# Patient Record
Sex: Male | Born: 1935 | Race: White | Hispanic: No | Smoking: Former smoker
Health system: Southern US, Community
[De-identification: ages and names within clinical notes are randomized; demographics above are authoritative.]

## PROBLEM LIST (undated history)

## (undated) ENCOUNTER — Emergency Department (HOSPITAL_COMMUNITY): Discharge status: Against Medical Advice | Payer: Medicare Other

## (undated) DIAGNOSIS — R42 Dizziness and giddiness: Secondary | ICD-10-CM

## (undated) DIAGNOSIS — E785 Hyperlipidemia, unspecified: Secondary | ICD-10-CM

## (undated) DIAGNOSIS — R55 Syncope and collapse: Secondary | ICD-10-CM

## (undated) DIAGNOSIS — M549 Dorsalgia, unspecified: Secondary | ICD-10-CM

## (undated) DIAGNOSIS — S129XXA Fracture of neck, unspecified, initial encounter: Secondary | ICD-10-CM

## (undated) DIAGNOSIS — I2510 Atherosclerotic heart disease of native coronary artery without angina pectoris: Secondary | ICD-10-CM

## (undated) DIAGNOSIS — I639 Cerebral infarction, unspecified: Secondary | ICD-10-CM

## (undated) DIAGNOSIS — F418 Other specified anxiety disorders: Secondary | ICD-10-CM

## (undated) DIAGNOSIS — I255 Ischemic cardiomyopathy: Secondary | ICD-10-CM

## (undated) DIAGNOSIS — E039 Hypothyroidism, unspecified: Secondary | ICD-10-CM

## (undated) DIAGNOSIS — I10 Essential (primary) hypertension: Secondary | ICD-10-CM

## (undated) DIAGNOSIS — K279 Peptic ulcer, site unspecified, unspecified as acute or chronic, without hemorrhage or perforation: Secondary | ICD-10-CM

## (undated) DIAGNOSIS — G8929 Other chronic pain: Secondary | ICD-10-CM

## (undated) DIAGNOSIS — Z8701 Personal history of pneumonia (recurrent): Secondary | ICD-10-CM

## (undated) DIAGNOSIS — N400 Enlarged prostate without lower urinary tract symptoms: Secondary | ICD-10-CM

## (undated) DIAGNOSIS — I213 ST elevation (STEMI) myocardial infarction of unspecified site: Secondary | ICD-10-CM

## (undated) DIAGNOSIS — K219 Gastro-esophageal reflux disease without esophagitis: Secondary | ICD-10-CM

## (undated) DIAGNOSIS — Z8673 Personal history of transient ischemic attack (TIA), and cerebral infarction without residual deficits: Secondary | ICD-10-CM

## (undated) DIAGNOSIS — I214 Non-ST elevation (NSTEMI) myocardial infarction: Secondary | ICD-10-CM

## (undated) DIAGNOSIS — R1310 Dysphagia, unspecified: Secondary | ICD-10-CM

## (undated) DIAGNOSIS — R4589 Other symptoms and signs involving emotional state: Secondary | ICD-10-CM

## (undated) DIAGNOSIS — M1990 Unspecified osteoarthritis, unspecified site: Secondary | ICD-10-CM

## (undated) DIAGNOSIS — R079 Chest pain, unspecified: Secondary | ICD-10-CM

## (undated) DIAGNOSIS — I509 Heart failure, unspecified: Secondary | ICD-10-CM

## (undated) DIAGNOSIS — E119 Type 2 diabetes mellitus without complications: Secondary | ICD-10-CM

## (undated) HISTORY — DX: Other chronic pain: G89.29

## (undated) HISTORY — DX: Dorsalgia, unspecified: M54.9

## (undated) HISTORY — DX: Hyperlipidemia, unspecified: E78.5

## (undated) HISTORY — DX: Gastro-esophageal reflux disease without esophagitis: K21.9

## (undated) HISTORY — PX: TONSILLECTOMY: SUR1361

## (undated) HISTORY — DX: Fracture of neck, unspecified, initial encounter: S12.9XXA

## (undated) HISTORY — DX: Peptic ulcer, site unspecified, unspecified as acute or chronic, without hemorrhage or perforation: K27.9

## (undated) HISTORY — DX: Benign prostatic hyperplasia without lower urinary tract symptoms: N40.0

## (undated) HISTORY — DX: Type 2 diabetes mellitus without complications: E11.9

## (undated) HISTORY — PX: CORONARY ANGIOPLASTY: SHX604

---

## 1987-07-15 HISTORY — PX: CORONARY ARTERY BYPASS GRAFT: SHX141

## 2004-02-06 ENCOUNTER — Ambulatory Visit (HOSPITAL_COMMUNITY): Admission: RE | Admit: 2004-02-06 | Discharge: 2004-02-06 | Payer: Self-pay | Admitting: Family Medicine

## 2004-09-14 ENCOUNTER — Emergency Department (HOSPITAL_COMMUNITY): Admission: EM | Admit: 2004-09-14 | Discharge: 2004-09-14 | Payer: Self-pay | Admitting: Emergency Medicine

## 2006-01-18 ENCOUNTER — Emergency Department (HOSPITAL_COMMUNITY): Admission: EM | Admit: 2006-01-18 | Discharge: 2006-01-18 | Payer: Self-pay | Admitting: Emergency Medicine

## 2006-01-18 ENCOUNTER — Ambulatory Visit: Payer: Self-pay | Admitting: Gastroenterology

## 2006-01-22 ENCOUNTER — Ambulatory Visit: Payer: Self-pay | Admitting: Gastroenterology

## 2006-06-25 ENCOUNTER — Emergency Department (HOSPITAL_COMMUNITY): Admission: EM | Admit: 2006-06-25 | Discharge: 2006-06-25 | Payer: Self-pay | Admitting: Emergency Medicine

## 2006-07-07 ENCOUNTER — Emergency Department (HOSPITAL_COMMUNITY): Admission: EM | Admit: 2006-07-07 | Discharge: 2006-07-07 | Payer: Self-pay | Admitting: Emergency Medicine

## 2007-08-13 ENCOUNTER — Ambulatory Visit (HOSPITAL_COMMUNITY): Admission: RE | Admit: 2007-08-13 | Discharge: 2007-08-13 | Payer: Self-pay | Admitting: Family Medicine

## 2008-07-26 ENCOUNTER — Ambulatory Visit (HOSPITAL_COMMUNITY): Admission: RE | Admit: 2008-07-26 | Discharge: 2008-07-26 | Payer: Self-pay | Admitting: Family Medicine

## 2009-05-14 ENCOUNTER — Emergency Department (HOSPITAL_COMMUNITY): Admission: EM | Admit: 2009-05-14 | Discharge: 2009-05-14 | Payer: Self-pay | Admitting: Emergency Medicine

## 2009-05-18 ENCOUNTER — Emergency Department (HOSPITAL_COMMUNITY): Admission: EM | Admit: 2009-05-18 | Discharge: 2009-05-19 | Payer: Self-pay | Admitting: Emergency Medicine

## 2009-05-29 ENCOUNTER — Emergency Department (HOSPITAL_COMMUNITY): Admission: EM | Admit: 2009-05-29 | Discharge: 2009-05-29 | Payer: Self-pay | Admitting: Emergency Medicine

## 2009-08-13 HISTORY — PX: OTHER SURGICAL HISTORY: SHX169

## 2009-09-14 ENCOUNTER — Emergency Department (HOSPITAL_COMMUNITY): Admission: EM | Admit: 2009-09-14 | Discharge: 2009-09-14 | Payer: Self-pay | Admitting: Emergency Medicine

## 2009-09-14 ENCOUNTER — Ambulatory Visit (HOSPITAL_COMMUNITY)
Admission: EM | Admit: 2009-09-14 | Discharge: 2009-09-15 | Payer: Self-pay | Source: Home / Self Care | Admitting: Emergency Medicine

## 2009-09-15 ENCOUNTER — Ambulatory Visit: Payer: Self-pay | Admitting: Gastroenterology

## 2009-09-17 ENCOUNTER — Encounter: Payer: Self-pay | Admitting: Gastroenterology

## 2009-09-18 ENCOUNTER — Telehealth: Payer: Self-pay | Admitting: Gastroenterology

## 2009-09-19 ENCOUNTER — Ambulatory Visit: Payer: Self-pay | Admitting: Gastroenterology

## 2009-09-19 ENCOUNTER — Ambulatory Visit (HOSPITAL_COMMUNITY): Admission: RE | Admit: 2009-09-19 | Discharge: 2009-09-19 | Payer: Self-pay | Admitting: Gastroenterology

## 2009-10-03 ENCOUNTER — Ambulatory Visit (HOSPITAL_COMMUNITY): Admission: RE | Admit: 2009-10-03 | Discharge: 2009-10-03 | Payer: Self-pay | Admitting: Gastroenterology

## 2009-10-03 ENCOUNTER — Ambulatory Visit: Payer: Self-pay | Admitting: Gastroenterology

## 2010-03-23 ENCOUNTER — Emergency Department (HOSPITAL_COMMUNITY)
Admission: EM | Admit: 2010-03-23 | Discharge: 2010-03-23 | Payer: Self-pay | Source: Home / Self Care | Admitting: Emergency Medicine

## 2010-04-07 ENCOUNTER — Ambulatory Visit: Payer: Self-pay | Admitting: Cardiology

## 2010-04-07 ENCOUNTER — Inpatient Hospital Stay (HOSPITAL_COMMUNITY): Admission: EM | Admit: 2010-04-07 | Discharge: 2010-04-09 | Payer: Self-pay | Admitting: Emergency Medicine

## 2010-05-24 ENCOUNTER — Inpatient Hospital Stay (HOSPITAL_COMMUNITY): Admission: RE | Admit: 2010-05-24 | Discharge: 2010-05-27 | Payer: Self-pay | Admitting: Cardiology

## 2010-05-24 ENCOUNTER — Ambulatory Visit: Payer: Self-pay | Admitting: Cardiology

## 2010-05-26 ENCOUNTER — Encounter: Payer: Self-pay | Admitting: Cardiology

## 2010-06-11 ENCOUNTER — Ambulatory Visit (HOSPITAL_COMMUNITY): Admission: RE | Admit: 2010-06-11 | Discharge: 2010-06-11 | Payer: Self-pay | Admitting: Cardiology

## 2010-06-11 ENCOUNTER — Ambulatory Visit: Payer: Self-pay | Admitting: Cardiology

## 2010-06-11 DIAGNOSIS — J069 Acute upper respiratory infection, unspecified: Secondary | ICD-10-CM | POA: Insufficient documentation

## 2010-06-11 DIAGNOSIS — Z951 Presence of aortocoronary bypass graft: Secondary | ICD-10-CM

## 2010-06-12 ENCOUNTER — Encounter: Payer: Self-pay | Admitting: Adult Health

## 2010-06-27 ENCOUNTER — Emergency Department (HOSPITAL_COMMUNITY)
Admission: EM | Admit: 2010-06-27 | Discharge: 2010-06-27 | Payer: Self-pay | Source: Home / Self Care | Admitting: Emergency Medicine

## 2010-07-17 ENCOUNTER — Ambulatory Visit: Payer: Self-pay | Admitting: Cardiology

## 2010-07-21 ENCOUNTER — Inpatient Hospital Stay (HOSPITAL_COMMUNITY): Admission: EM | Admit: 2010-07-21 | Discharge: 2010-07-23 | Payer: Self-pay | Source: Home / Self Care

## 2010-07-29 LAB — BASIC METABOLIC PANEL
BUN: 5 mg/dL — ABNORMAL LOW (ref 6–23)
CO2: 26 mEq/L (ref 19–32)
Calcium: 8.1 mg/dL — ABNORMAL LOW (ref 8.4–10.5)
Chloride: 109 mEq/L (ref 96–112)
Creatinine, Ser: 0.83 mg/dL (ref 0.4–1.5)
GFR calc Af Amer: >60 mL/min (ref >60)
GFR calc non Af Amer: >60 mL/min (ref >60)
Glucose, Bld: 178 mg/dL — ABNORMAL HIGH (ref 70–99)
Potassium: 4.1 mEq/L (ref 3.5–5.1)
Sodium: 140 mEq/L (ref 135–145)

## 2010-07-29 LAB — COMPREHENSIVE METABOLIC PANEL
ALT: 26 U/L (ref 0–53)
ALT: 21 U/L (ref 0–53)
AST: 27 U/L (ref 0–37)
AST: 24 U/L (ref 0–37)
Albumin: 3.5 g/dL (ref 3.5–5.2)
Albumin: 2.9 g/dL — ABNORMAL LOW (ref 3.5–5.2)
Alkaline Phosphatase: 56 U/L (ref 39–117)
BUN: 20 mg/dL (ref 6–23)
BUN: 9 mg/dL (ref 6–23)
CO2: 24 mEq/L (ref 19–32)
Calcium: 8.5 mg/dL (ref 8.4–10.5)
Calcium: 8.1 mg/dL — ABNORMAL LOW (ref 8.4–10.5)
Chloride: 99 mEq/L (ref 96–112)
Chloride: 105 mEq/L (ref 96–112)
Creatinine, Ser: 0.86 mg/dL (ref 0.4–1.5)
Creatinine, Ser: 0.71 mg/dL (ref 0.4–1.5)
GFR calc Af Amer: >60 mL/min (ref >60)
GFR calc Af Amer: >60 mL/min (ref >60)
GFR calc non Af Amer: >60 mL/min (ref >60)
GFR calc non Af Amer: >60 mL/min (ref >60)
Glucose, Bld: 222 mg/dL — ABNORMAL HIGH (ref 70–99)
Glucose, Bld: 154 mg/dL — ABNORMAL HIGH (ref 70–99)
Potassium: 3.9 mEq/L (ref 3.5–5.1)
Potassium: 3.8 mEq/L (ref 3.5–5.1)
Sodium: 131 mEq/L — ABNORMAL LOW (ref 135–145)
Sodium: 138 mEq/L (ref 135–145)
Total Bilirubin: 0.6 mg/dL (ref 0.3–1.2)
Total Protein: 6.2 g/dL (ref 6.0–8.3)
Total Protein: 5.4 g/dL — ABNORMAL LOW (ref 6.0–8.3)

## 2010-07-29 LAB — URINALYSIS, ROUTINE W REFLEX MICROSCOPIC
Bilirubin Urine: NEGATIVE
Hgb urine dipstick: NEGATIVE
Ketones, ur: >80 mg/dL — AB
Nitrite: NEGATIVE
Protein, ur: NEGATIVE mg/dL
Specific Gravity, Urine: 1.025 (ref 1.005–1.030)
Urine Glucose, Fasting: 500 mg/dL — AB
Urobilinogen, UA: 0.2 mg/dL (ref 0.0–1.0)
pH: 6.5 (ref 5.0–8.0)

## 2010-07-29 LAB — CBC
HCT: 38.3 % — ABNORMAL LOW (ref 39.0–52.0)
HCT: 36.9 % — ABNORMAL LOW (ref 39.0–52.0)
HCT: 35.7 % — ABNORMAL LOW (ref 39.0–52.0)
Hemoglobin: 13.6 g/dL (ref 13.0–17.0)
Hemoglobin: 12.8 g/dL — ABNORMAL LOW (ref 13.0–17.0)
Hemoglobin: 12.3 g/dL — ABNORMAL LOW (ref 13.0–17.0)
MCH: 32.1 pg (ref 26.0–34.0)
MCH: 31.8 pg (ref 26.0–34.0)
MCH: 31.5 pg (ref 26.0–34.0)
MCHC: 35.5 g/dL (ref 30.0–36.0)
MCHC: 34.7 g/dL (ref 30.0–36.0)
MCHC: 34.5 g/dL (ref 30.0–36.0)
MCV: 90.3 fL (ref 78.0–100.0)
MCV: 91.6 fL (ref 78.0–100.0)
MCV: 91.5 fL (ref 78.0–100.0)
Platelets: 138 10*3/uL — ABNORMAL LOW (ref 150–400)
Platelets: 118 10*3/uL — ABNORMAL LOW (ref 150–400)
Platelets: 124 10*3/uL — ABNORMAL LOW (ref 150–400)
RBC: 4.24 MIL/uL (ref 4.22–5.81)
RBC: 4.03 MIL/uL — ABNORMAL LOW (ref 4.22–5.81)
RBC: 3.9 MIL/uL — ABNORMAL LOW (ref 4.22–5.81)
RDW: 12.8 % (ref 11.5–15.5)
RDW: 13 % (ref 11.5–15.5)
RDW: 13.1 % (ref 11.5–15.5)
WBC: 11.1 10*3/uL — ABNORMAL HIGH (ref 4.0–10.5)
WBC: 4.9 10*3/uL (ref 4.0–10.5)
WBC: 4.1 10*3/uL (ref 4.0–10.5)

## 2010-07-29 LAB — GLUCOSE, CAPILLARY
Glucose-Capillary: 173 mg/dL — ABNORMAL HIGH (ref 70–99)
Glucose-Capillary: 189 mg/dL — ABNORMAL HIGH (ref 70–99)
Glucose-Capillary: 150 mg/dL — ABNORMAL HIGH (ref 70–99)
Glucose-Capillary: 157 mg/dL — ABNORMAL HIGH (ref 70–99)
Glucose-Capillary: 166 mg/dL — ABNORMAL HIGH (ref 70–99)
Glucose-Capillary: 190 mg/dL — ABNORMAL HIGH (ref 70–99)
Glucose-Capillary: 199 mg/dL — ABNORMAL HIGH (ref 70–99)
Glucose-Capillary: 199 mg/dL — ABNORMAL HIGH (ref 70–99)
Glucose-Capillary: 176 mg/dL — ABNORMAL HIGH (ref 70–99)
Glucose-Capillary: 192 mg/dL — ABNORMAL HIGH (ref 70–99)
Glucose-Capillary: 199 mg/dL — ABNORMAL HIGH (ref 70–99)

## 2010-07-29 LAB — CARDIAC PANEL(CRET KIN+CKTOT+MB+TROPI)
CK, MB: 14.9 ng/mL (ref 0.3–4.0)
CK, MB: 11.3 ng/mL (ref 0.3–4.0)
CK, MB: 8.9 ng/mL (ref 0.3–4.0)
CK, MB: 8.8 ng/mL (ref 0.3–4.0)
Relative Index: 4.2 — ABNORMAL HIGH (ref 0.0–2.5)
Relative Index: 4 — ABNORMAL HIGH (ref 0.0–2.5)
Relative Index: 3.4 — ABNORMAL HIGH (ref 0.0–2.5)
Relative Index: 3.8 — ABNORMAL HIGH (ref 0.0–2.5)
Total CK: 351 U/L — ABNORMAL HIGH (ref 7–232)
Total CK: 283 U/L — ABNORMAL HIGH (ref 7–232)
Total CK: 259 U/L — ABNORMAL HIGH (ref 7–232)
Total CK: 229 U/L (ref 7–232)
Troponin I: <0.01 ng/mL (ref 0.00–0.06)
Troponin I: <0.01 ng/mL (ref 0.00–0.06)
Troponin I: 0.01 ng/mL (ref 0.00–0.06)
Troponin I: 0.02 ng/mL (ref 0.00–0.06)

## 2010-07-29 LAB — DIFFERENTIAL
Basophils Absolute: 0 10*3/uL (ref 0.0–0.1)
Basophils Absolute: 0 10*3/uL (ref 0.0–0.1)
Basophils Absolute: 0 10*3/uL (ref 0.0–0.1)
Basophils Relative: 0 % (ref 0–1)
Basophils Relative: 0 % (ref 0–1)
Basophils Relative: 0 % (ref 0–1)
Eosinophils Absolute: 0.4 10*3/uL (ref 0.0–0.7)
Eosinophils Absolute: 0.3 10*3/uL (ref 0.0–0.7)
Eosinophils Absolute: 0.3 10*3/uL (ref 0.0–0.7)
Eosinophils Relative: 4 % (ref 0–5)
Eosinophils Relative: 6 % — ABNORMAL HIGH (ref 0–5)
Eosinophils Relative: 7 % — ABNORMAL HIGH (ref 0–5)
Lymphocytes Relative: 4 % — ABNORMAL LOW (ref 12–46)
Lymphocytes Relative: 18 % (ref 12–46)
Lymphocytes Relative: 23 % (ref 12–46)
Lymphs Abs: 0.5 10*3/uL — ABNORMAL LOW (ref 0.7–4.0)
Lymphs Abs: 0.9 10*3/uL (ref 0.7–4.0)
Lymphs Abs: 0.9 10*3/uL (ref 0.7–4.0)
Monocytes Absolute: 0.8 10*3/uL (ref 0.1–1.0)
Monocytes Absolute: 0.6 10*3/uL (ref 0.1–1.0)
Monocytes Absolute: 0.6 10*3/uL (ref 0.1–1.0)
Monocytes Relative: 7 % (ref 3–12)
Monocytes Relative: 12 % (ref 3–12)
Monocytes Relative: 15 % — ABNORMAL HIGH (ref 3–12)
Neutro Abs: 9.5 10*3/uL — ABNORMAL HIGH (ref 1.7–7.7)
Neutro Abs: 3.2 10*3/uL (ref 1.7–7.7)
Neutro Abs: 2.3 10*3/uL (ref 1.7–7.7)
Neutrophils Relative %: 85 % — ABNORMAL HIGH (ref 43–77)
Neutrophils Relative %: 64 % (ref 43–77)
Neutrophils Relative %: 56 % (ref 43–77)

## 2010-07-29 LAB — LIPID PANEL
Cholesterol: 145 mg/dL (ref 0–200)
HDL: 38 mg/dL — ABNORMAL LOW (ref >39)
LDL Cholesterol: 85 mg/dL (ref 0–99)
Total CHOL/HDL Ratio: 3.8 RATIO
Triglycerides: 108 mg/dL (ref <150)
VLDL: 22 mg/dL (ref 0–40)

## 2010-07-29 LAB — CK TOTAL AND CKMB (NOT AT ARMC)
CK, MB: 21.3 ng/mL (ref 0.3–4.0)
CK, MB: 18.4 ng/mL (ref 0.3–4.0)
Relative Index: 4.3 — ABNORMAL HIGH (ref 0.0–2.5)
Relative Index: 4.1 — ABNORMAL HIGH (ref 0.0–2.5)
Total CK: 493 U/L — ABNORMAL HIGH (ref 7–232)
Total CK: 453 U/L — ABNORMAL HIGH (ref 7–232)

## 2010-07-29 LAB — HEPARIN LEVEL (UNFRACTIONATED)
Heparin Unfractionated: 0.34 IU/mL (ref 0.30–0.70)
Heparin Unfractionated: <0.1 IU/mL — ABNORMAL LOW (ref 0.30–0.70)
Heparin Unfractionated: 0.38 IU/mL (ref 0.30–0.70)
Heparin Unfractionated: 0.1 IU/mL — ABNORMAL LOW (ref 0.30–0.70)

## 2010-07-29 LAB — TROPONIN I
Troponin I: <0.01 ng/mL (ref 0.00–0.06)
Troponin I: 0.01 ng/mL (ref 0.00–0.06)

## 2010-07-29 LAB — LIPASE, BLOOD
Lipase: 175 U/L — ABNORMAL HIGH (ref 11–59)
Lipase: 27 U/L (ref 11–59)
Lipase: 24 U/L (ref 11–59)

## 2010-07-29 LAB — MAGNESIUM
Magnesium: 1.5 mg/dL (ref 1.5–2.5)
Magnesium: 2 mg/dL (ref 1.5–2.5)

## 2010-08-03 ENCOUNTER — Inpatient Hospital Stay (HOSPITAL_COMMUNITY)
Admission: EM | Admit: 2010-08-03 | Discharge: 2010-08-06 | Payer: Self-pay | Source: Home / Self Care | Attending: Internal Medicine | Admitting: Internal Medicine

## 2010-08-06 LAB — BASIC METABOLIC PANEL
BUN: 9 mg/dL (ref 6–23)
BUN: 11 mg/dL (ref 6–23)
CO2: 25 mEq/L (ref 19–32)
CO2: 25 mEq/L (ref 19–32)
CO2: 25 mEq/L (ref 19–32)
Calcium: 8.8 mg/dL (ref 8.4–10.5)
Calcium: 8.8 mg/dL (ref 8.4–10.5)
Creatinine, Ser: 0.84 mg/dL (ref 0.4–1.5)
GFR calc Af Amer: >60 mL/min (ref >60)
GFR calc non Af Amer: >60 mL/min (ref >60)
GFR calc non Af Amer: >60 mL/min (ref >60)
Glucose, Bld: 199 mg/dL — ABNORMAL HIGH (ref 70–99)
Glucose, Bld: 178 mg/dL — ABNORMAL HIGH (ref 70–99)
Potassium: 4.3 mEq/L (ref 3.5–5.1)
Potassium: 3.9 mEq/L (ref 3.5–5.1)
Sodium: 135 mEq/L (ref 135–145)

## 2010-08-06 LAB — CBC
HCT: 36.6 % — ABNORMAL LOW (ref 39.0–52.0)
Hemoglobin: 12.2 g/dL — ABNORMAL LOW (ref 13.0–17.0)
Hemoglobin: 12.6 g/dL — ABNORMAL LOW (ref 13.0–17.0)
MCH: 30.3 pg (ref 26.0–34.0)
MCH: 30.4 pg (ref 26.0–34.0)
MCHC: 33.3 g/dL (ref 30.0–36.0)
MCHC: 33.2 g/dL (ref 30.0–36.0)
MCHC: 33.1 g/dL (ref 30.0–36.0)
MCV: 92 fL (ref 78.0–100.0)
Platelets: 159 10*3/uL (ref 150–400)
Platelets: 158 10*3/uL (ref 150–400)
RBC: 3.99 MIL/uL — ABNORMAL LOW (ref 4.22–5.81)
WBC: 7 10*3/uL (ref 4.0–10.5)

## 2010-08-06 LAB — GLUCOSE, CAPILLARY
Glucose-Capillary: 201 mg/dL — ABNORMAL HIGH (ref 70–99)
Glucose-Capillary: 139 mg/dL — ABNORMAL HIGH (ref 70–99)
Glucose-Capillary: 235 mg/dL — ABNORMAL HIGH (ref 70–99)

## 2010-08-06 LAB — URINE CULTURE
Culture  Setup Time: 201201212038
Culture: NO GROWTH

## 2010-08-06 LAB — PLATELET INHIBITION P2Y12: P2Y12 % Inhibition: 0 %

## 2010-08-06 LAB — URINALYSIS, ROUTINE W REFLEX MICROSCOPIC
Nitrite: NEGATIVE
Specific Gravity, Urine: 1.006 (ref 1.005–1.030)
Urobilinogen, UA: 0.2 mg/dL (ref 0.0–1.0)

## 2010-08-06 LAB — CARDIAC PANEL(CRET KIN+CKTOT+MB+TROPI)
Total CK: 245 U/L — ABNORMAL HIGH (ref 7–232)
Troponin I: 0.01 ng/mL (ref 0.00–0.06)

## 2010-08-06 LAB — CK TOTAL AND CKMB (NOT AT ARMC)
CK, MB: 17.6 ng/mL (ref 0.3–4.0)
Relative Index: 5.2 — ABNORMAL HIGH (ref 0.0–2.5)

## 2010-08-06 LAB — TROPONIN I: Troponin I: 0.01 ng/mL (ref 0.00–0.06)

## 2010-08-06 LAB — LIPASE, BLOOD: Lipase: 29 U/L (ref 11–59)

## 2010-08-06 LAB — D-DIMER, QUANTITATIVE: D-Dimer, Quant: 0.46 ug/mL-FEU (ref 0.00–0.48)

## 2010-08-06 LAB — HEPATIC FUNCTION PANEL
Albumin: 3.4 g/dL — ABNORMAL LOW (ref 3.5–5.2)
Bilirubin, Direct: <0.1 mg/dL (ref 0.0–0.3)
Total Bilirubin: 0.3 mg/dL (ref 0.3–1.2)

## 2010-08-06 LAB — APTT: aPTT: 26 seconds (ref 24–37)

## 2010-08-06 LAB — PROTIME-INR
INR: 0.95 (ref 0.00–1.49)
Prothrombin Time: 12.9 seconds (ref 11.6–15.2)
Prothrombin Time: 13.1 seconds (ref 11.6–15.2)

## 2010-08-06 LAB — MAGNESIUM: Magnesium: 1.9 mg/dL (ref 1.5–2.5)

## 2010-08-07 LAB — CBC
MCH: 31.1 pg (ref 26.0–34.0)
MCHC: 34.1 g/dL (ref 30.0–36.0)
MCV: 91 fL (ref 78.0–100.0)
Platelets: 162 10*3/uL (ref 150–400)
RBC: 4.12 MIL/uL — ABNORMAL LOW (ref 4.22–5.81)
RDW: 12.9 % (ref 11.5–15.5)

## 2010-08-07 LAB — BASIC METABOLIC PANEL
BUN: 10 mg/dL (ref 6–23)
Calcium: 9 mg/dL (ref 8.4–10.5)
Chloride: 106 mEq/L (ref 96–112)
Creatinine, Ser: 0.82 mg/dL (ref 0.4–1.5)
GFR calc Af Amer: >60 mL/min (ref >60)
GFR calc non Af Amer: >60 mL/min (ref >60)

## 2010-08-07 LAB — GLUCOSE, CAPILLARY: Glucose-Capillary: 242 mg/dL — ABNORMAL HIGH (ref 70–99)

## 2010-08-08 NOTE — Procedures (Addendum)
NAME:  Jeffrey [name], VANALLEN NO.:  [id number]  MEDICAL RECORD NO.:  [id number]          PATIENT TYPE:  INP  LOCATION:  2021                         FACILITY:  [facility]  PHYSICIAN:  Bevelyn Buckles. Bensimhon, MDDATE OF BIRTH:  [birth year]  DATE OF PROCEDURE:  08/05/2010 DATE OF DISCHARGE:                           CARDIAC CATHETERIZATION   PRIMARY CARE PHYSICIAN:  Madelin Rear. Sherwood Gambler, MD  CARDIOLOGIST:  Peter M. Swaziland, MD  PATIENT IDENTIFICATION:  [name] is a 77 year old male with multiple medical problems including coronary artery disease.  In November 2011, he was admitted with ST elevation myocardial infarction and underwent emergent stenting of the proximal portion of the saphenous vein graft to left circumflex system as well as distal lesion with either in the sequential portion of the vein graft or the native circumflex.  This was done by Dr. Swaziland.  He was readmitted yesterday with chest pain and indigestion.  His CK-MBs were elevated like they were previously but his troponin's were totally negative.  His EKG was nonacute.  Given his symptoms and recent stent, he was referred for repeat catheterization.  PROCEDURES PERFORMED: 1. Selective coronary angiography. 2. Saphenous vein graft angiography x2. 3. LIMA angiography. 4. Left heart cath. 5. Left ventriculogram.  DESCRIPTION OF PROCEDURE:  The risks and indication were explained. Consent was signed and placed on the chart.  A 5-French arterial sheath was placed in the right femoral artery using a modified Seldinger technique.  Standard catheters including JL-4, JR-4, and angled pigtail were used.  All catheter exchanges were made over wire.  There were no apparent complications.  Central aortic pressure 77/77 with a mean of 118.  LV pressure 165/3 with an EDP of 8.  There was no aortic stenosis.  Left main was normal.  LAD gave rise to 2 diagonal branches before being occluded.  In the ostial portion  of the LAD, there was a 90% lesion.  In the midportion of the LAD prior to the second diagonal, there was a 80% lesion.  Left circumflex gave off 2 tiny obtuse marginal and there was a third obtuse marginal that was larger but totally occluded and then there was a fourth marginal branch which was small but gave off collaterals to the RCA.  The distal AV groove circumflex was totally occluded.  The right coronary artery had diffuse disease throughout the proximal midsection approximately 90% and it was totally occluded in the midsection.  This was chronic.  LIMA to the LAD was widely patent.  There was a 50-60% lesion in the mid to distal native LAD after insertion of the graft.  Saphenous vein graft to the right coronary was chronically occluded.  Saphenous vein graft to the OM1 and with a possible sequential section to distal posterolateral, had a stent placed in the proximal portion. This was widely patent.  In either of the native circumflex of the distal limb of the sequential graft, there was another stent which was also widely patent.  Left ventriculogram done in the RAO position showed an EF of 60-65% with no regional wall motion abnormalities.  ASSESSMENT: 1. Severe 3-vessel coronary artery disease. 2.  Continued patency of the left internal mammary artery to the left     anterior descending with a 50-60% lesion in the distal left     anterior descending after the graft insertion. 3. Saphenous vein graft to the left circumflex system has 2 previously     placed stents which are widely patent. 4. Saphenous vein graft to the right coronary artery is chronically     occluded. 5. Normal left ventricular function.  PLAN/DISCUSSION:  Mr. Ransom has stable coronary revascularization.  I do not think his CK-MB is directly related to ongoing ischemia.  There was no culprit lesion.  We will plan with continued medical therapy.     Bevelyn Buckles. Bensimhon, MD     DRB/MEDQ   D:  08/05/2010  T:  08/06/2010  Job:  [id number]  cc:   Madelin Rear. Sherwood Gambler, MD  Electronically Signed by Arvilla Meres MD on 08/08/2010 04:04:02 PM

## 2010-08-08 NOTE — Consult Note (Addendum)
NAME:  Jeffrey [name], Jeffrey [name] NO.:  [id number]  MEDICAL RECORD NO.:  [id number]          PATIENT TYPE:  INP  LOCATION:  2021                         FACILITY:  [facility]  PHYSICIAN:  Arturo Morton. Riley Kill, MD, FACCDATE OF BIRTH:  [birth year]  DATE OF CONSULTATION: DATE OF DISCHARGE:                                CONSULTATION   CHIEF COMPLAINT:  Chest pain.  HISTORY OF PRESENT ILLNESS:  Mr. Schaberg is a very pleasant 77 year old gentleman who lives alone.  In November of this year, he underwent percutaneous intervention of the saphenous vein graft to the obtuse marginal and native obtuse marginal with two drug-eluting stents.  He was seen back in the clinic, and was doing well at that time.  The patient had prior bypass graft surgery.  His internal mammary is known to be patent and his saphenous vein graft to his right coronary artery is occluded.  Today, he developed left-sided chest pain.  It was really nonradiating and unassociated with diaphoresis.  However, on his last admission, he thought he just had indigestion, and importantly ended up having an acute coronary syndrome.  His CK-MBs have been positive, but in reviewing his chart carefully, there have been positive CK-MBs in the past with negative troponins.  On his last admission, the troponins were actually positive during the acute coronary syndrome.  The current troponins remain negative.  He is admitted for further evaluation and treatment.  He denies any fever, chills, or other associated symptoms. He was seen by the [facility], and we will called for consultation.  ALLERGIES:  No known drug allergies.  PAST MEDICAL HISTORY: 1. Hypertension. 2. Coronary artery disease status post coronary bypass graft surgery. 3. Status post percutaneous intervention of the circumflex graft with     known total occlusion the RCA graft and patent internal mammary to     the LAD. 4. History of diabetes. 5.  Hyperlipidemia. 6. Prior history of tobacco use. 7. Osteoarthritis. 8. History of prior esophageal dilatation. 9. Prior meningitis. 10.History of degenerative joint disease. 11.Recent acute pancreatitis at the [facility]. 12.Mole on the upper lip. 13.History of peptic ulcer disease and also history of peptic     stricture status post dilatation in March 2011 with erosions at     that time.  CURRENT MEDICATIONS: 1. Altace 10 mg daily. 2. Aspirin 325 mg daily. 3. Coreg 12.5 mg p.o. b.i.d. 4. Plavix 75 mg by mouth daily. 5. Metformin 500 mg b.i.d. 6. Multivitamin daily. 7. Nitroglycerin 0.4 mg every 5 minutes p.r.n. 8. Pravastatin 40 mg at bedtime. 9. Prilosec 20 mg daily. 10.Tamsulosin 0.4 mg at bedtime. 11.Tylenol p.r.n.  FAMILY HISTORY:  The patient was raised by his uncle.  His mother was deceased when he was 43-year-old with appendicitis.  His father was deceased at 22.  He has a sister.  SOCIAL HISTORY:  He is widowed.  He does chew tobacco, but quit 2-3 months ago.  He has not had bloody stools.  His symptoms currently do not sound like is prior pancreatitis.  There is no epigastric pain, nausea, vomiting, diarrhea, or black stools.  He has  not had significant shortness of breath.  There have been no rashes.  There is no urinary symptoms.  The complete review of systems is otherwise negative.  PHYSICAL EXAMINATION:  GENERAL:  He is alert and oriented, in no distress.  He looks elderly. VITAL SIGNS:  Blood pressure is 129/98, pulse 77, respiratory rate 16, temperature 98.1. NECK:  The jugular veins are not distended.  Carotid upstrokes are brisk. LUNGS:  Clear to auscultation and percussion. CARDIAC:  The PMI is nondisplaced. Rhythm is regular. ABDOMEN:  Importantly, the abdomen is soft without obvious hepatosplenomegaly. EXTREMITIES:  There is no cyanosis, clubbing or edema.  Cranial nerves are focally intact.  The chest x-ray shows no acute  cardiopulmonary changes.  The EKG demonstrates sinus rhythm.  There is possibly a PAC or P-wave artifact in leads V4, V5, and V6.  His D-dimer is 0.46, magnesium is 1.9. Urinalysis is a completely negative.  Troponin is 0.01, CK-MB is elevated at 18.7 with relative index of 5.7, prior CK-MB is 17.6.  CBC reveals a hemoglobin of 12.2, hematocrit of 36.6, platelet count 153, and white count of 7000.  IMPRESSION: 1. Chest pain, with some atypical features.  CK-MBs are positive but     troponins are negative.  CK-MBs have been previously positive. 2. Recent percutaneous coronary artery intervention with drug-eluting     stents to the saphenous vein graft to native circumflex with known     total occlusion of saphenous vein graft to the right and patent     internal mammary to the LAD. 3. Hypertension. 4. Diabetes. 5. Recent history of pancreatitis with currently normal lipase.  RECOMMENDATIONS: 1. I will admit the patient Hospital and we can get serial enzymes.  I     would be careful about over interpreting the CK-MB at this point in     time.  In the absence of EKG changes and completely normal     troponin, I will be fairly conservative. 2. I agree with use of subcutaneous low-dose Lovenox at the present     time to prevent DVT.  I would be reluctant to use IV heparin in the     setting of recent pancreatitis. 3. I will continue his current medications including Coreg, Altace,     aspirin, and Plavix. 4. I would intensify his proton pump inhibitors with stopping the     Prilosec at the present time and replacing with Protonix as there     is a drug interaction with Prilosec in terms of competition for the     CYP2C19 directed enzyme. 5. We will consider possible re-cath on Monday to reevaluate the     situation. 6. We will get a P2Y12 test to evaluate the suppression of his     platelets in the setting of proton pump inhibitor inhibition.     Arturo Morton. Riley Kill, MD,  [facility]     TDS/MEDQ  D:  08/03/2010  T:  08/04/2010  Job:  [id number]  cc:   Peter M. Swaziland, M.D. Bettey Mare. Lyman Bishop, NP  Electronically Signed by Shawnie Pons MD [facility] on 08/08/2010 04:44:35 AM

## 2010-08-09 NOTE — Discharge Summary (Addendum)
NAME:  Jeffrey, [name] NO.:  [id number]  MEDICAL RECORD NO.:  [id number]          PATIENT TYPE:  INP  LOCATION:  A320                          FACILITY:  [facility]  PHYSICIAN:  Ramiro Harvest, MD    DATE OF BIRTH:  11-05-1928  DATE OF ADMISSION:  07/21/2010 DATE OF DISCHARGE:  01/10/2012LH                         DISCHARGE SUMMARY-REFERRING   PRIMARY CARE PHYSICIAN:  Dr. Elfredia Nevins.  DISCHARGE DIAGNOSES: 1. Acute pancreatitis, resolved. 2. Abnormal cardiac enzymes. 3. Mole in the left lower lip. 4. Hypertension. 5. Dehydration, resolved. 6. Coronary artery disease status post stent to native distal     circumflex and status post stent to the saphenous vein graft to the     circumflex in November 2011. 7. Hypomagnesemia, resolved, repleted. 8. Diabetes mellitus type 2 with A1c of 7.8, May 25, 2010. 9. Hyperlipidemia. 10.Tobacco abuse. 11.Osteoarthritis and chronic pain. 12.History of esophageal dilatation. 13.Coronary artery disease status post CABG in 1999. 14.Status post tonsillectomy at age 54. 15.Chronic back pain. 16.History of DJD.  DISCHARGE MEDICATIONS: 1. Altace 10 mg p.o. daily. 2. Aspirin 325 mg p.o. daily. 3. Coreg 12.5 mg p.o. b.i.d. 4. Plavix 75 mg p.o. daily. 5. Metformin 500 mg p.o. b.i.d. 6. Multivitamin 1 tablet p.o. daily. 7. Nitroglycerin sublingual 0.4 mg sublingual q. 5 minutes x3 p.r.n. 8. Pravastatin 40 mg p.o. q.h.s. 9. Prilosec 20 mg p.o. daily. 10.Tamsulosin 0.4 mg p.o. q.h.s. 11.Tylenol 325 mg p.o. t.i.d. p.r.n.  DISPOSITION AND FOLLOWUP:  The patient will be discharged home.  The patient is to follow up with his PCP 1 week post discharge to reassess patient's acute pancreatitis.  The patient did have home on the left lower lip which will need to be reassessed as the patient does have a history of chewing tobacco and may need a dermatology referral.  Will defer this to PCP.  CONSULTATIONS DONE: A cardiology  consultation was done.  The patient was seen in consultation by Dr. Lester Bing on July 22, 2010.  PROCEDURES PERFORMED: Abdominal ultrasound was done on July 21, 2010 that showed mild layering sludge in the gallbladder, otherwise, unremarkable abdominal ultrasound.  Chest x-ray done July 21, 2010, showed no acute disease post CABG.  BRIEF ADMISSION HISTORY AND PHYSICAL:  Jeffrey [name] is a pleasant 77 year old Caucasian gentleman with history of coronary artery disease status post CABG, status post cardiac cath lab stent who presented to the ED with emesis times 1 day duration.  The patient stated that he was in stable health until after working on his truck.  Thereafter, he developed sudden onset of nausea and vomiting.  He claimed he vomited multiple times and the vomiting contained ingested food.  His vomiting was non-projectile.  He denied any associated abdominal pain.  Denied any history of chest pain.  Denied any history of shortness of breath. He also denied any fever or chills.  No rigors.  Vomiting was said to have persisted and the patient was feeling very weak.  He has presented to the ED for further evaluation.  For the rest of admission history and physical, please see H&P dictated by Dr. Beverly Gust of job number  [id number].  HOSPITAL COURSE: 1. Acute pancreatitis.  The patient was admitted with an acute     pancreatitis with questionable etiology.  The patient was placed on     bowel rest, placed on IV fluids, supportive care and pain     management was also placed on antiemetics and monitored.  An     abdominal ultrasound was obtained with results stated above which     showed only mild layering sludge of the gallbladder, otherwise was     normal.  The common bile duct was 7.1 mm in diameter and was     unremarkable.  A fasting lipid panel was also obtained which came     back with a total cholesterol 145, triglycerides of 108, LDL of 85.     Lipase levels  on admission were elevated at 175.  The patient was     monitored and followed.  The patient improved clinically.  He was     subsequently placed on clear liquids which he tolerated well.  His     diet was advanced to a cardiac prudent diet.  The patient tolerated     his diet, did not have any further nausea or vomiting.  No further     abdominal pain.  His lipase levels trended down such that by day of     discharge his lipase had normalized to 24.  The patient will be     discharged in stable and improved condition to follow up with PCP     as an outpatient. 2. Abnormal cardiac enzymes.  During the patient's workup.  Cardiac     enzymes were cycled.  The patient did have abnormal cardiac enzymes     and had elevated CK, CK-MB and relative index that was greater than     3.  However, his troponins were normal.  The patient did not have     any chest pain or shortness of breath secondary to his recent     cardiac history.  Due to abnormal cardiac enzymes and elevated     relative index, he was initially placed on a full-dose heparin.     The patient did not have any chest pain or shortness of breath,     remained asymptomatic.  Due to his cardiac history, a cardiology     consultation was done.  The patient was seen in consultation by Dr.     Dietrich Pates on July 22, 2010, and was felt at that time that no     further cardiac workup was needed.  It was felt that this was     likely muscle origin in nature.  The patient did not have any     further nausea or vomiting.  It was felt no further workup was     needed.  Heparin was subsequently discontinued.  The patient     remained asymptomatic and will be discharged home in stable and     improved condition and will follow up with Dr. Dietrich Pates of [facility]     Cardiology on August 07, 2010. 3. Mole on left lower lip.  The patient will need to follow up with     his PCP as an outpatient. 4. Dehydration.  On admission the patient was noted to  be dehydrated.     The patient was hydrated with IV fluids and was euvolemic by day of     discharge. 5. Hypomagnesemia.  The patient was noted to be hypomagnesemia,  the     patient's magnesium levels were repleted. 6. The rest of the patient's chronic medical issues remained stable     throughout the hospitalization.  The patient was discharged in     stable and improved condition.  DISCHARGE VITAL SIGNS:  On day of discharge, vital signs temperature 98.1, pulse of 65, respirations 20, blood pressure 139/63, satting 94% on room air.  DISCHARGE LABORATORIES:  Magnesium level of 2, lipase level of 24.  B- met:  Sodium 140, potassium 4.1, chloride 109, bicarb 26, glucose 178, BUN 5, creatinine 0.83 and a calcium of 8.1.  CBC with a white count of 4.1, hemoglobin 12.3, hematocrit 35.7 and platelet count of 124 with ANC of 2.3.  It was a pleasure taking care of Jeffrey [name].     Ramiro Harvest, MD     DT/MEDQ  D:  07/23/2010  T:  07/23/2010  Job:  [id number]  cc:   Madelin Rear. Sherwood Gambler, MD Fax: [phone]  Gerrit Friends. Dietrich Pates, MD, [facility] [address] [place], [place] [address]  Peter M. Swaziland, M.D. Fax: [phone]  Electronically Signed by Ramiro Harvest MD on 08/09/2010 12:19:04 PM

## 2010-08-09 NOTE — Discharge Summary (Addendum)
NAME:  Jeffrey [name], Jeffrey [name] NO.:  [id number]  MEDICAL RECORD NO.:  [id number]          PATIENT TYPE:  INP  LOCATION:  2021                         FACILITY:  [facility]  PHYSICIAN:  Ramiro Harvest, MD    DATE OF BIRTH:  June 18, 1929  DATE OF ADMISSION:  08/03/2010 DATE OF DISCHARGE:  08/06/2010                        DISCHARGE SUMMARY    PRIMARY CARE PHYSICIAN: Dr. Elfredia Nevins, [place], [place].  DISCHARGE DIAGNOSES: 1. Chest pain, likely noncardiac in nature, resolved. 2. Type 2 diabetes, hemoglobin A1c 7.8 in November 2011. 3. Hypertension. 4. Hyperlipidemia. 5. History of peptic ulcer disease. 6. Coronary artery disease status post stent to the native distal     circumflex, status post stent to the saphenous vein graft to the     circumflex in November 2011. 7. Status post coronary artery bypass grafting with left internal     mammary artery to left anterior descending , saphenous vein graftto circumflex and saphenous vein grafts to right coronary artery. 8. History of tobacco abuse. 9. Osteoarthritis and chronic back pain. 10.History of esophageal dilatation status post tonsillectomy at age     77. 11.History of meningitis at age 77. 12.History of degenerative joint disease. 13.Recent history of acute pancreatitis July 21, 2010 through     July 23, 2010 at [facility]. 14.Mole on left lower lip. 15.History of peptic ulcer disease. 16.History of distal peptic stricture status post dilatation per EGD     March 2011. 17.History of multiple erosions in the body and antrum per EGD March     2011. 18.Hypertension. 19.Hyperlipidemia.  DISCHARGE MEDICATIONS: 1. Protonix 40 mg p.o. daily 2. Altace 10 mg p.o. daily. 3. Aspirin 325 mg p.o. daily. 4. Coreg 12.5 mg p.o. b.i.d.. 5. Plavix 75 mg p.o. daily. 6. Metformin 500 mg p.o. b.i.d. 7. Multivitamin 1 tablet p.o. daily. 8. Nitroglycerin 0.4 mg sublingual q.5 minutes x3 p.r.n.. 9.  Pravastatin 40 mg p.o. daily. 10.Silver sulfadiazine 1% cream to the leg applied topically 3 times     daily as needed. 11.Flomax 0.4 mg p.o. q.h.s. 12.Tylenol 325 mg p.o. t.i.d. p.r.n.  DISPOSITION AND FOLLOWUP:  The patient will be discharged home.  The patient is to follow up with PCP in the next 1-2 weeks to reassess his noncardiac chest pain.  The patient's Prilosec has been changed to Protonix, which the patient will be discharged on.  The patient is also to follow up with Dr. Dietrich Pates of [facility] in 2 weeks post discharge.  CONSULTATIONS DONE:  A cardiology consultation was done.  The patient was seen in consultation by Dr. Shawnie Pons of [facility] on August 03, 2010.  PROCEDURES PERFORMED: 1. A chest x-ray was done August 03, 2010 that showed no active     cardiopulmonary abnormalities. 2. A cardiac catheterization was done August 05, 2010 that showed     severe 3-vessel coronary artery disease, continued patency of the     left internal mammary artery to the LAD with a 50%-60% lesion in    the distal LAD after the graft insertion, saphenous vein graft to     left circumflex system has to  previously placed stents, which are     widely patent, saphenous vein grafts to the RCA is chronically     occluded, normal LV function with an EF of 60%-65%.  BRIEF ADMISSION HISTORY AND PHYSICAL:  [name] is a 77 year old Caucasian gentleman with history of coronary artery disease status post CABG in 1999.  Also with drug-eluting stent to the SVG to the circumflex  and distal native her circumflex in June 02, 2010, history of type 2 diabetes, hypertension, hyperlipidemia, prior history of tobacco abuse, history of peptic ulcer disease, history of esophageal dilatation who presented to the ED with left substernal nonradiating chest pain.  The patient states that he woke up on the morning of admission with intermitted left substernal chest pain  described as a dull pain lasting a few seconds and intermittent in nature.  There was no shortness of breath, no pleuritic component.  No burning sensation or dysphagia.  No burping, no fever, no chills, no diarrhea or constipation.  No nausea or vomiting or dysuria, no weakness, no recent falls.  No recent travels.  No other associated symptoms.  The patient was seen in the ED.  We were called to admit the patient for further evaluation and management.  At the time on the interview, the patient was chest pain free.  For the rest of admission history and physical, please see history and physical dictated per Dr. Janee Morn of job number [id number].  HOSPITAL COURSE:  Chest pain.  The patient was admitted with a chest pain.  He did have some atypical features; however, the patient has multiple risk factors of coronary artery disease, hypertension, type 2 diabetes, hyperlipidemia, family history of coronary artery disease. EKG did show normal sinus rhythm and was unchanged from prior EKG. Initial set of cardiac enzymes did have an elevated CK and CK-MB with a relative index of greater than 3,  troponins were negative x2.  The patient was admitted for a chest pain rule out.  He was placed on oxygen, aspirin, nitroglycerin, Plavix and his beta blocker and his ACE inhibitor.  A 2-D echo was not repeated as one was done in November 2011.  A cardiology consultation was done.  The patient was seen in consultation by Dr. Shawnie Pons of [facility] Card[facility] and at the time,  it was recommended to keep the patient on low-dose Lovenox at the time to prevent DVT, to continue his current medication and to place him on a proton pump inhibitor, stop his Prilosec.  A P2Y12 test was done to elevate the suppression of his platelets in the setting of PPI, which was essentially a normal test.  The patient was recatheterized on Monday, August 05, 2010 with results as stated above, which did show a stable  coronary revascularization.  It was felt that the patient's elevated CK-MB was not secondary to ongoing ischemia and was likely a noncardiac in nature,  was felt to continue medical therapy.  The patient did not have any further chest pain during the hospitalization and will be discharged home in stable and improved condition to follow up with his PCP as an outpatient.  The patient will be discharged in stable and improved condition.  The rest of the patient's chronic medical issues remained stable throughout the hospitalization.  The patient will be discharged in stable condition.  On day of discharge vital signs temperature 97.8, pulse of 64, respirations 20, blood pressure 137/80, satting 95% on room air.  DISCHARGE LABS:  Sodium 139,  potassium 3.8, chloride 106, bicarb 25, glucose 184, BUN 10, creatinine 0.82, calcium of 9.0.  CBC with a white count of 6.4, hemoglobin 12.8, hematocrit 37.5 and a platelet count of 162.  It was a pleasure taking care of [name].     Ramiro Harvest, MD     DT/MEDQ  D:  08/06/2010  T:  08/06/2010  Job:  [id number]  cc:   Madelin Rear. Sherwood Gambler, MD Gerrit Friends. Dietrich Pates, MD, [facility] Arturo Morton. Riley Kill, MD, [facility]  Electronically Signed by Ramiro Harvest MD on 08/09/2010 12:19:39 PM

## 2010-08-09 NOTE — H&P (Addendum)
NAME:  Jeffrey [name], Jeffrey [name] NO.:  [id number]  MEDICAL RECORD NO.:  [id number]          PATIENT TYPE:  INP  LOCATION:  1825                         FACILITY:  [facility]  PHYSICIAN:  [facility]      DATE OF BIRTH:  March 18, 1929  DATE OF ADMISSION:  08/03/2010 DATE OF DISCHARGE:                             HISTORY & PHYSICAL   PRIMARY CARE PHYSICIAN:  Madelin Rear. Sherwood Gambler, [place] in [place]  CARDIOLOGIST:  [facility]/Dr. Peter Swaziland  CHIEF COMPLAINT:  Chest pain.  HISTORY OF PRESENT ILLNESS:  Jeffrey [name] is a 77 year old Caucasian gentleman with history of coronary artery disease status post CABG in 1999 and also with a drug-eluting stent to the SVG to the circ and distal negative circ on June 02, 2010, history of type 2 diabetes, hypertension, hyperlipidemia, prior history of tobacco abuse, history of peptic ulcer disease, and history of esophageal dilatation presented to the ED with left substernal nonradiating chest pain. old Caucasian gentleman with history of coronary artery disease status post CABG in 1999 and also with a drug-eluting stent to the SVG to the circ and distal negative circ on June 02, 2010, history of type 2 diabetes, hypertension, hyperlipidemia, prior history of tobacco abuse, history of peptic ulcer disease, and history of esophageal dilatation presented to the ED with left substernal nonradiating chest pain.  The patient states awoke up in the morning of admission with intermittent left substernal chest pain described as a dull pain lasting a few seconds and intermittent in nature.  No shortness of breath, no pleuritic component, no burning sensation, no dysphagia, no burping.  No fever, no chills, no diarrhea, no constipation, no nausea or vomiting.  No dysuria, no weakness, no recent falls.  No recent travels.  No other associated symptoms.  The patient was seen in the ED, we were called to admit the patient for further evaluation and management.  The patient at the time of this interview was currently chest pain free.  ALLERGIES:  No known drug allergies.  PAST MEDICAL HISTORY: 1. History of hypertension. 2. Coronary artery disease status post stent to the native distal     circumflex status post stent to the saphenous vein graft to the     circumflex in November 2011. 3. Status post CABG grafting with LIMA to LAD,  SVG to circumflex, and     SVG to RCA. 4. Diabetes. 5. Hypertension. 6. Hyperlipidemia. 7. History of tobacco abuse. 8. Osteoarthritis and chronic back pain. 9. History of esophageal dilatation status post tonsillectomy at age     25. 10.History of meningitis at age 25. 11.History of DJD. 12.Recent history of acute pancreatitis on July 21, 2010 through     July 23, 2010 at [facility]. 13.Mole on the left lower lip. 14.History of peptic ulcer disease. 15.History of distal peptic stricture status post dilatation per EGD     of March 2011. 16.History of multiple erosions in the body and antrum per EGD of     March 2011.  HOME MEDICATIONS: 1. Altace 10 mg p.o. daily. 2. Aspirin 325 mg p.o. daily. 3. Coreg 12.5 mg p.o. b.i.d. 4. Plavix 75 mg p.o. daily. 5. Metformin 500 mg p.o. b.i.d. 6. Multivitamin 1 tablet p.o. daily. 7. Nitroglycerin 0.4 mg every 5 minutes x3 p.r.n. 8. Pravastatin 40 mg p.o. at bedtime. 9. Prilosec 20 mg p.o. daily. 10.Tamsulosin 0.4 mg p.o. at bedtime. 11.Tylenol 325 mg p.o. t.i.d. p.r.n.  SOCIAL HISTORY:  The patient lives in [place].  He  is currently widowed.  Had a history of chewing tobacco, but quit 2-3 months ago. Occasional alcohol use.  No IV drug use.  Former tobacco smoker.  FAMILY HISTORY:  The patient was raised by his uncle, mother deceased when he was 65-year-old with appendicitis.  Father deceased at age 59 from MI.  Has one sister, unknown health issues.  REVIEW OF SYSTEMS:  As per HPI, otherwise negative.  PHYSICAL EXAMINATION:  VITAL SIGNS:  Temperature 98.2, blood pressure 126/63, pulse of 78, respirations 14, and satting 97% on room air. GENERAL:  The patient is well-developed, well-nourished male in no acute cardiopulmonary distress. HEENT:  Normocephalic, atraumatic.  Pupils are equal, round, and reactive to light and accommodation.  Extraocular movements intact. Oropharynx is clear.  No lesions.  No  exudates. NECK:  Supple.  No lymphadenopathy. RESPIRATORY:  Lungs are clear to auscultation bilaterally.  No wheezing. No crackles.  No rhonchi. CARDIOVASCULAR:  Regular rate and rhythm.  No murmurs, rubs, or gallops. CHEST:  Wall is nontender to palpation. ABDOMEN:  Soft, nontender, nondistended.  Positive bowel sounds. EXTREMITIES:  No clubbing, cyanosis, or edema. NEUROLOGIC:  The patient is alert and oriented x3.  Cranial nerves II through XII are grossly intact with no focal deficits.  LABORATORY DATA:  On admission, initial cardiac enzymes CK 340, CK-MB 17.6, relative index of 5.2 with a troponin-I of less than 0.01.  Second set of cardiac enzymes CK 327, CK-MB 18.7, relative index 5.7, troponin I of 0.02, a lipase of 29.  B-MET sodium 135, potassium 4.3, chloride 103, bicarb 25, glucose 249, BUN 14, creatinine 0.84, and calcium of 8.8.  Hepatic panel; bilirubin 0.3, alk phosphatase 55, AST 25, ALT 25, protein 5.9, albumin of 3.4, PTT of 26, PT of 12.9, and INR 0.95.  CBC; white count 7, hemoglobin 12.2, hematocrit 36.6, and platelet count of 153.  IMAGING DATA:  A chest x-ray shows no acute cardiopulmonary distress. EKG normal sinus rhythm unchanged.  EKG is unchanged from prior EKG.  ASSESSMENT AND PLAN:  Jeffrey [name] is a 77 year old gentleman with history of coronary artery disease status post coronary artery bypass grafting, history of recent stent placement in November 2011, history of diabetes, hypertension, hyperlipidemia, and family history of coronary disease presenting with chest pain. old gentleman with history of coronary artery disease status post coronary artery bypass grafting, history of recent stent placement in November 2011, history of diabetes, hypertension, hyperlipidemia, and family history of coronary disease presenting with chest pain.  1. Chest pain likely atypical in nature, questionable etiology.     However, the patient with multiple risk factors with coronary     artery disease, hypertension, type 2 diabetes, hyperlipidemia, and     family history of coronary artery disease.  EKG shows normal sinus     rhythm which is unchanged.  Initial set of cardiac enzymes that     have elevated CK, CK-MB, and a relative  index greater than 3.     Troponin is negative x2.  We will admit the patient for serial     enzymes.  Place on O2, aspirin, nitroglycerin, Plavix, morphine     sulfate, beta-blocker, and ACE inhibitor.  The patient had a recent     2-D echo done in November 2011 with an EF of 50-55%, and as such we     will not repeat that.  We will consult with Cardiology for further     evaluation secondary to his elevated CKs and CK-MB. 2. Type 2 diabetes, hemoglobin A1c of 7.8 in November 2011.  We will     place on sliding scale insulin. 3. Hypertension, stable.  We will place on home dose Altace and Coreg. 4. Hyperlipidemia.  Continue home dose pravastatin. 5. Chronic back pain. 6. Prophylaxis, Protonix for GI prophylaxis, Lovenox for DVT     prophylaxis.  It has been a pleasure taking care of [name].     Ramiro Harvest, MD   ______________________________ [facility]    DT/MEDQ  D:  08/03/2010  T:  08/03/2010  Job:  [id number]  cc:   Madelin Rear. Sherwood Gambler, MD Peter M. Swaziland, M.D. [facility]  Electronically Signed by Ramiro Harvest MD on 08/09/2010 12:19:13 PM

## 2010-08-15 NOTE — Letter (Signed)
Summary: EGD/ED ORDER  EGD/ED ORDER   Imported By: Ave Filter 09/19/2009 12:41:48  _____________________________________________________________________  External Attachment:    Type:   Image     Comment:   External Document

## 2010-08-15 NOTE — Letter (Signed)
Summary: TRIAGE-EGD  TRIAGE-EGD   Imported By: Ave Filter 09/17/2009 10:09:51  _____________________________________________________________________  External Attachment:    Type:   Image     Comment:   External Document  Appended Document: TRIAGE-EGD Hold glipizide on AM of procedure. Continue Metformin. Hold ASA.  Appended Document: TRIAGE-EGD Pt informed.

## 2010-08-15 NOTE — Assessment & Plan Note (Signed)
Summary: EPH   Visit Type:  Follow-up Primary Provider:  [facility]  CC:  PATIENT HAS A COLD.  History of Present Illness: Jeffrey [name] is a 81 CM we are seeing on hospital follow-up after admission to [facility] hospital in the setting of ST elevated inferiro circumflex artery myocardial infarction.  He subsequently had emergent cardiac catherization and PTCA and DES X 2 to the SVG graft to circumflex and to the distal circumflex was completed by Dr. Peter Swaziland, on 05/24/2010.  He has a history of CAD with CABG, diabetes,hypertension, hyperlipidemia.  He was placed on plavix, aspirin, prilosec and coreg prior to dismissal.  He is here for follow-up.  He is without cardiac complaint but has cold symptoms and frequent cough for 4 days that is causing chest soreness and insominia.  Preventive Screening-Counseling & Management  Alcohol-Tobacco     Cans of tobacco/week: 1  Current Medications (verified): 1)  Aspirin 325 Mg Tabs (Aspirin) .... Take 1 Tab Daily 2)  Metformin Hcl 500 Mg Tabs (Metformin Hcl) .... Two Times A Day 3)  Altace 10 Mg Caps (Ramipril) .... Once Daily 4)  Macrobid 100 Mg Caps (Nitrofurantoin Monohyd Macro) .... Take 1 Tab At Bedtime 5)  Carvedilol 12.5 Mg Tabs (Carvedilol) .... Take 1 Tab Two Times A Day 6)  Plavix 75 Mg Tabs (Clopidogrel Bisulfate) .... Take 1 Tab Daily Lot [id number]  Exp  6/12 7)  Tamsulosin Hcl 0.4 Mg Caps (Tamsulosin Hcl) .... Take 1 Tab Daily 8)  Pravastatin Sodium 40 Mg Tabs (Pravastatin Sodium) .... Take 1 Tab Daily 9)  Tylenol 325 Mg Tabs (Acetaminophen) .... Take As Needed For Pain 10)  Nitrostat 0.4 Mg Subl (Nitroglycerin) .... Take As Needed For Chest Pain 11)  Daily-Vitamin  Tabs (Multiple Vitamin) .... Take 1 Tab Daily 12)  Zithromax Z-Pak 250 Mg Tabs (Azithromycin) .... Take As Directed 13)  Prilosec 40 Mg Cpdr (Omeprazole) .... Take 1 Tablet By Mouth Once A Day 14)  Robitussin Chest Congestion 100 Mg/24ml Syrp (Guaifenesin) .... Take As  Directed  Allergies (verified): No Known Drug Allergies  Comments:  Nurse/Medical Assistant: patient brought med bottles he is no longer on amaryl patient not taking prilosec  Past History:  Past medical, surgical, family and social histories (including risk factors) reviewed, and no changes noted (except as noted below).  Past Medical History: Reviewed history from 12/24/2009 and no changes required. Coronary Artery Disease Diabetes Hypertension Peptic Ulcer Disease  Past Surgical History: Reviewed history from 05/30/2010 and no changes required. post aortocornary bypass surgery  tonsillectomy  Family History: Reviewed history and no changes required.  Social History: Reviewed history from 05/30/2010 and no changes required. Retired Consulting civil engineer) Tobacco Use - Yes. (chews) Alcohol Use - no Regular Exercise - no Drug Use - no Cans of tobacco/week:  1  Review of Systems       cold symptoms with cough and sinus congestion. All other systems have been reviewed and are negative unless stated above.   Vital Signs:  Patient profile:   77 year old male Height:      70 inches Weight:      188 pounds BMI:     27.07 Pulse rate:   80 / minute BP sitting:   139 / 76  (right arm)  Vitals Entered By: Dreama Saa, CNA (June 11, 2010 1:31 PM)  Physical Exam  General:  Well developed, well nourished, in no acute distress. Eyes:  PERRLA/EOM intact; conjunctiva and lids normal. Nose:  clear nasal  discharge.   Lungs:  Mild basilar crackles with frequent coughing. Heart:  Non-displaced PMI, chest non-tender; regular rate and rhythm, S1, S2 without murmurs, rubs or gallops. Carotid upstroke normal, no bruit. Normal abdominal aortic size, no bruits. Femorals normal pulses, no bruits. Pedals normal pulses. No edema, no varicosities. Abdomen:  Bowel sounds positive; abdomen soft and non-tender without masses, organomegaly, or hernias noted. No hepatosplenomegaly. Msk:  Back  normal, normal gait. Muscle strength and tone normal. Pulses:  pulses normal in all 4 extremities Extremities:  No clubbing or cyanosis. Neurologic:  Alert and oriented x 3. Psych:  Normal affect.   EKG  Procedure date:  06/11/2010  Findings:      Normal sinus rhythm with rate of: 71 bpm Non-specific ST-T wave changes noted.    Impression & Recommendations:  Problem # 1:  CORONARY ARTERY BYPASS GRAFT, THREE VESSEL, HX OF (ICD-V45.81) He has had no further cardiac complaints since PTCA and DES X2 to CX and SVG to CX.  He is not walking much secondary to chronic severe back pain from spurs.  He will be referred to cardiac rehab phase II to assist with continued cardiac support.  I will provide him with samples of Plavix as he has run out and RX for same as he has not filled it.  He will continue other meds.  Problem # 2:  GERD (ICD-530.81) Provided RX for pirolosec as he has not filled this since discharge without a prescription provided. The following medications were removed from the medication list:    Omeprazole 20 Mg Cpdr (Omeprazole) ..... Once daily His updated medication list for this problem includes:    Prilosec 40 Mg Cpdr (Omeprazole) .Marland Kitchen... Take 1 tablet by mouth once a day  Problem # 3:  URI (ICD-465.9) Rx as below His updated medication list for this problem includes:    Zithromax Z-pak 250 Mg Tabs (Azithromycin) .Marland Kitchen... Take as directed  Other Orders: T-Chest x-ray, 2 views (40102)  Patient Instructions: 1)  Your physician recommends that you schedule a follow-up appointment in: 3 months 2)  Your physician has recommended you make the following change in your medication: plavix 75mg  daily, prilosec 40mg  daily, robitussin otc as directed 3)  A chest x-ray takes a picture of the organs and structures inside the chest, including the heart, lungs, and blood vessels. This test can show several things, including, whether the heart is enlarged; whether fluid is building up in  the lungs; and whether pacemaker / defibrillator leads are still in place. Prescriptions: PRILOSEC 40 MG CPDR (OMEPRAZOLE) Take 1 tablet by mouth once a day  #30 x 3   Entered by:   Jeffrey Lower RN   Authorized by:   Joni Reining, NP   Signed by:   Jeffrey Lower RN on 06/11/2010   Method used:   Electronically to        [facility]  [address] (retail)       [address]       [place], [place]  [address]       Ph: [phone]       Fax: [phone]   RxID:   [id number] ZITHROMAX Z-PAK 250 MG TABS (AZITHROMYCIN) take as directed  #6 x 0   Entered by:   Jeffrey Lower RN   Authorized by:   Joni Reining, NP   Signed by:   Jeffrey Lower RN on 06/11/2010   Method used:   Electronically  to        [facility] [address] (retail)       [address]       [place], [place]  [address]       Ph: [phone]       Fax: [phone]   RxID:   [id number]

## 2010-08-15 NOTE — Progress Notes (Signed)
Summary: Questions R/T his procedure tomorrow/ he is diabetic  Phone Note Call from Patient   Caller: Patient Summary of Call: Pt called and is concerned because  his appt for procedure is 11:30 tomorrow. He is diabetic and has had problems with his sugar dropping too much before. He lives alone and just wanted some more instructions as to what he should do. Please advise! Initial call taken by: Cloria Spring LPN,  September 18, 2009 10:22 AM     Appended Document: Questions R/T his procedure tomorrow/ he is diabetic Adjustments have been made with oral DM meds. If he checks his sugar the morning of procedure and level is low, he can take sugar pill or sips of orange juice to get it up. Other option would be to schedule him for early am slot.  Appended Document: Questions R/T his procedure tomorrow/ he is diabetic Pt informed. He will check his blood sugar and do as instructed.

## 2010-08-19 NOTE — Consult Note (Addendum)
NAME:  Jeffrey [name], Jeffrey [name] NO.:  [id number]  MEDICAL RECORD NO.:  [id number]          PATIENT TYPE:  INP  LOCATION:  A320                          FACILITY:  [facility]  PHYSICIAN:  Gerrit Friends. Dietrich Pates, MD, FACCDATE OF BIRTH:  Oct 04, 1928  DATE OF CONSULTATION:  07/22/2010 DATE OF DISCHARGE:                                CONSULTATION   PRIMARY CARDIOLOGIST:  Peter M. [place], MD  PRIMARY CARE PHYSICIAN:  Madelin Rear. Sherwood Gambler, MD  REASON FOR CONSULTATION:  Rule out cardiac etiology for vomiting with recent coronary artery stents placed in November 2011.  HISTORY OF PRESENT ILLNESS:  A 77 year old Caucasian male with known history of coronary artery bypass grafting in 1999, drug-eluting stents to the SVG to the circumflex and distal native circumflex in November 2011 who had been doing some heavy lifting on Saturday working on an old truck.  Afterwards in that evening, began to have some vomiting, which occurred for 3 hours persistently.  He became weak and called EMS to bring him to the emergency room.  On arrival to the emergency room, the patient was found to have a blood pressure of 119/64, pulse of 83, respirations 18.  The patient was given Zofran and labs were drawn to rule out pancreatitis.  He was also placed on a heparin drip and Cardiology was called in [place] and was advised by Dr. Antoine Poche to have the patient worked up here.  If the patient was found to be positive on cardiac enzymes, that they would shift him to [facility]. It was found that the patient's lipase was elevated at 175 and he was admitted for pancreatitis.  Until cardiac enzymes were fully cycled, he remained on a heparin drip.  We are asked for further evaluation concerning this patient and with recent stents in November 2011 as this being the etiology of his symptoms.  REVIEW OF SYSTEMS:  Positive for sweating, nausea, vomiting and mild abdominal pain.  All other systems are reviewed and are  found to be negative unless listed above.  PAST MEDICAL HISTORY: 1. CAD. A:  Status post coronary artery bypass grafting with LIMA to LAD, SVG to circumflex and SVG to RCA. 1. Status post cardiac catheterization November 2011 revealing a 90%     proximal SVG to circumflex and a 90% distal native circumflex. A:  Status post drug-eluting stent to the SVG to circumflex and native circumflex on November 2011. 1. Diabetes. 2. Hypertension. 3. Hyperlipidemia. 4. Tobacco abuse. 5. Osteoarthritis and chronic pain. 6. Esophageal dilatation.  PAST SURGICAL HISTORY:  Coronary artery bypass grafting in 1999 and a tonsillectomy when he was 12.  Past echocardiogram revealing an EF of 50- 55% in November 2011.  SOCIAL HISTORY:  He lives in [place] alone.  He chews tobacco, but quit 2 months ago.  He drinks a shot of whiskey at bedtime.  They help him to sleep.  Negative for drug use.  He was a former tobacco smoker.  FAMILY HISTORY:  He was raised by his uncle.  His mother deceased when he was 55 year old with appendicitis. Father deceased from an MI at age 48.  He has 1 sister, but he does not know her health status as they have been separated.  CURRENT MEDICATIONS PRIOR TO ADMISSION: 1. Prilosec 20 mg daily. 2. Tamsulosin 0.4 mg daily. 3. Pravastatin 40 mg daily. 4. Nitroglycerin 0.4 mg sublingual daily. 5. Multivitamin daily. 6. Metformin 500 mg b.i.d. 7. Carvedilol 12.5 mg b.i.d. 8. Clopidogrel 75 mg daily. 9. Altace 10 mg daily. 10.Aspirin 325 mg daily.  ALLERGIES:  No known drug allergies.  CURRENT LABORATORIES:  Sodium 138, potassium 3.8, chloride 105, CO2 25, BUN 9, creatinine 0.71, glucose 154. Lipase 175.  Troponin 0.01, 0.01, and 0.01 respectively.  Magnesium 1.5.  Urinalysis negative for UTI. Total cholesterol 145, triglycerides 108, HDL 38, LDL 85.  Chest x-ray revealing no acute disease post CABG.  EKG normal sinus rhythm with lateral T-wave flattening, which  is different from EKG in November 2011, rate of 83 beats per minute.  PHYSICAL EXAMINATION:  VITAL SIGNS:  Blood pressure 142/73, pulse 71, respirations 20, temperature 97.9, O2 sat 96% on room air. GENERAL:  He is awake, alert and oriented, in no acute distress. HEENT:  Head is normocephalic and atraumatic.  Eyes:  PERRLA. NECK:  Supple without JVD or carotid bruits appreciated. CARDIOVASCULAR:  Regular rate and rhythm without murmurs, rubs, or gallops.  Mildly distant heart sounds.  Pulses are 2+ and equal without bruits. LUNGS:  Essentially clear to auscultation with a course sound on expiration in the right lower lobe. ABDOMEN:  Soft, nontender with 3+ bowel sounds.  There is no pain. There is negative Murphy sign. EXTREMITIES:  Without clubbing, cyanosis, or edema. NEUROLOGIC:  Cranial nerves II through XII are grossly intact. PSYCHIATRY:  The patient has a good affect and responds appropriately.  IMPRESSION: 1. Coronary artery disease status post stent to native distal     circumflex and status post stent to the saphenous vein graft to     circumflex in November 2011, angina for him as indigestion and is     described as severe burning.  He did not experience this prior to     his admission.  He just had persistent nausea and vomiting.  His     troponins are negative and CK-MB and CK are mildly elevated, but     trending down.  We will stop heparin.  Continue Plavix, aspirin,     ACE, and Coreg.  Echocardiogram is pending for changes in LV     function. 2. Hypomagnesemia.  This is being repleted, not a heavy drinker,     occasionally at bedtime he states with a normal potassium. 3. Rule out pancreatitis.  He has a normal amylase, but elevated     lipase at 175.  Normal LFTs.  Lopid has been discontinued. 4. Diabetes with elevated blood glucose on admission per primary to     follow and treat  PLAN:  This is a 77 year old Caucasian male with known history of CABG, stents to  the circumflex and SVG to circumflex in November 2011 who was admitted with intractable nausea and vomiting, found to have an elevated lipase, probably related to pancreatitis.  There is no evidence at this time for acute cardiac issue as troponins are negative.  We will discontinue heparin.  Echocardiogram is pending for changes in LV function.  He is to continue his current cardiac medications.  We will make further recommendations throughout hospital course depending upon the patient's response to treatment.  On behalf of the physicians and providers of [facility], we  would like to thank the [facility] and Dr. Sherwood Gambler for allowing [place] to participate in the care of this patient.     Bettey Mare. Lyman Bishop, NP   ______________________________ Gerrit Friends. Dietrich Pates, MD, [facility]    KML/MEDQ  D:  07/22/2010  T:  07/23/2010  Job:  [id number]  cc:   Madelin Rear. Sherwood Gambler, MD Fax: [phone]  [facility]  Electronically Signed by Joni Reining NP on 07/25/2010 03:59:57 PM Electronically Signed by [place] Bing MD [facility] on 08/19/2010 08:15:45 AM

## 2010-08-30 ENCOUNTER — Emergency Department (HOSPITAL_COMMUNITY)
Admission: EM | Admit: 2010-08-30 | Discharge: 2010-08-30 | Disposition: A | Discharge status: Home / Self Care | Payer: Medicare Other | Attending: Emergency Medicine | Admitting: Emergency Medicine

## 2010-08-30 ENCOUNTER — Emergency Department (HOSPITAL_COMMUNITY): Payer: Medicare Other

## 2010-08-30 DIAGNOSIS — R5381 Other malaise: Secondary | ICD-10-CM | POA: Insufficient documentation

## 2010-08-30 DIAGNOSIS — E7800 Pure hypercholesterolemia, unspecified: Secondary | ICD-10-CM | POA: Insufficient documentation

## 2010-08-30 DIAGNOSIS — E119 Type 2 diabetes mellitus without complications: Secondary | ICD-10-CM | POA: Insufficient documentation

## 2010-08-30 DIAGNOSIS — R5383 Other fatigue: Secondary | ICD-10-CM | POA: Insufficient documentation

## 2010-08-30 DIAGNOSIS — I2510 Atherosclerotic heart disease of native coronary artery without angina pectoris: Secondary | ICD-10-CM | POA: Insufficient documentation

## 2010-08-30 DIAGNOSIS — Z79899 Other long term (current) drug therapy: Secondary | ICD-10-CM | POA: Insufficient documentation

## 2010-08-30 DIAGNOSIS — I252 Old myocardial infarction: Secondary | ICD-10-CM | POA: Insufficient documentation

## 2010-08-30 DIAGNOSIS — R079 Chest pain, unspecified: Secondary | ICD-10-CM | POA: Insufficient documentation

## 2010-08-30 DIAGNOSIS — R42 Dizziness and giddiness: Secondary | ICD-10-CM | POA: Insufficient documentation

## 2010-08-30 DIAGNOSIS — I10 Essential (primary) hypertension: Secondary | ICD-10-CM | POA: Insufficient documentation

## 2010-08-30 LAB — POCT CARDIAC MARKERS
CKMB, poc: 9.8 ng/mL (ref 1.0–8.0)
CKMB, poc: 8.8 ng/mL (ref 1.0–8.0)
Myoglobin, poc: 176 ng/mL (ref 12–200)
Troponin i, poc: <0.05 ng/mL (ref 0.00–0.09)

## 2010-08-30 LAB — URINALYSIS, ROUTINE W REFLEX MICROSCOPIC
Bilirubin Urine: NEGATIVE
Hgb urine dipstick: NEGATIVE
Ketones, ur: NEGATIVE mg/dL
Urine Glucose, Fasting: 250 mg/dL — AB
pH: 7 (ref 5.0–8.0)

## 2010-08-30 LAB — COMPREHENSIVE METABOLIC PANEL
ALT: 26 U/L (ref 0–53)
AST: 26 U/L (ref 0–37)
Alkaline Phosphatase: 59 U/L (ref 39–117)
Calcium: 9.4 mg/dL (ref 8.4–10.5)
GFR calc Af Amer: >60 mL/min (ref >60)
Potassium: 4 mEq/L (ref 3.5–5.1)
Sodium: 135 mEq/L (ref 135–145)
Total Protein: 6.4 g/dL (ref 6.0–8.3)

## 2010-08-30 LAB — CBC
HCT: 37.8 % — ABNORMAL LOW (ref 39.0–52.0)
Hemoglobin: 12.8 g/dL — ABNORMAL LOW (ref 13.0–17.0)
MCH: 31.4 pg (ref 26.0–34.0)
MCHC: 33.9 g/dL (ref 30.0–36.0)
RDW: 12.9 % (ref 11.5–15.5)

## 2010-08-30 LAB — PROTIME-INR: Prothrombin Time: 12 seconds (ref 11.6–15.2)

## 2010-09-06 ENCOUNTER — Encounter: Payer: Self-pay | Admitting: Adult Health

## 2010-09-06 ENCOUNTER — Ambulatory Visit (INDEPENDENT_AMBULATORY_CARE_PROVIDER_SITE_OTHER): Payer: Medicare Other | Admitting: Adult Health

## 2010-09-06 DIAGNOSIS — I2510 Atherosclerotic heart disease of native coronary artery without angina pectoris: Secondary | ICD-10-CM

## 2010-09-06 DIAGNOSIS — R42 Dizziness and giddiness: Secondary | ICD-10-CM

## 2010-09-06 DIAGNOSIS — E7800 Pure hypercholesterolemia, unspecified: Secondary | ICD-10-CM

## 2010-09-09 ENCOUNTER — Encounter: Payer: Self-pay | Admitting: Adult Health

## 2010-09-10 NOTE — Assessment & Plan Note (Signed)
Summary: F3M/PER TP CHECK OUT 06/11/10/TMJ/AMD   Visit Type:  Follow-up Primary Provider:  [facility]   History of Present Illness: Mr. Panas is a 77 y/o CM with known history of CAD, CABG, STEMI and stent of the proximal portion of the SVG to L Cx (2001), hypertension, hyperlipidemia, type 2 diabetes, who is here for follow-up.  He is without complaint of chest pain or DOE. He has chronic dizziness and lightheadedness for which he takes meclazine. Sometime he has a glucose tablet as his glucose is low and feels much better concerning weakness when he does.  He admitts to not taking his pravachol as directed because he feels this makes his BP low.  He takes it "sometimes."  He is followed by Dr's at [facility] who monitor his blood levels.   Current Medications (verified): 1)  Aspirin 325 Mg Tabs (Aspirin) .... Take 1 Tab Daily 2)  Metformin Hcl 500 Mg Tabs (Metformin Hcl) .... Two Times A Day 3)  Altace 10 Mg Caps (Ramipril) .... Once Daily 4)  Macrobid 100 Mg Caps (Nitrofurantoin Monohyd Macro) .... Take 1 Tab At Bedtime 5)  Carvedilol 12.5 Mg Tabs (Carvedilol) .... Take 1 Tab Two Times A Day 6)  Plavix 75 Mg Tabs (Clopidogrel Bisulfate) .... Take 1 Tab Daily Lot [id number]  Exp  6/12 7)  Tamsulosin Hcl 0.4 Mg Caps (Tamsulosin Hcl) .... Take 1 Tab Daily 8)  Pravastatin Sodium 40 Mg Tabs (Pravastatin Sodium) .... Take Occasionally 9)  Tylenol 325 Mg Tabs (Acetaminophen) .... Take As Needed For Pain 10)  Nitrostat 0.4 Mg Subl (Nitroglycerin) .... Take As Needed For Chest Pain 11)  Daily-Vitamin  Tabs (Multiple Vitamin) .... Take 1 Tab Daily 12)  Protonix 40 Mg Tbec (Pantoprazole Sodium) .... Take 1 Tab Daily 13)  Silver Sulfadiazine 1 % Crea (Silver Sulfadiazine) .... Use As Needed  Allergies (verified): No Known Drug Allergies  Comments:  Nurse/Medical Assistant: no meds no list we reviewed meds he only takes pravastatin 40 mg occasionally  dont like taking it he was took off prilosec and  put on protonix  [facility] in [place]  Review of Systems       Occasional dizziness.  All other systems have been reviewed and are negative unless stated above.   Vital Signs:  Patient profile:   77 year old male Weight:      191 pounds BMI:     27.50 Pulse rate:   85 / minute Pulse (ortho):   91 / minute BP sitting:   116 / 66  (left arm) BP standing:   127 / 67  Vitals Entered By: Dreama Saa, CNA (September 06, 2010 12:53 PM)  Serial Vital Signs/Assessments:  Time      Position  BP       Pulse  Resp  Temp     By 1:30 PM   Lying LA  124/75   81                    Lynn Via LPN 1:61 PM   Sitting   132/77   79                    Lynn Via LPN 0:96 PM   Standing  127/67   91                    Lynn Via LPN  Comments: 0:45 PM Pt. denies dizziness By: Larita Fife Via LPN    Physical  Exam  General:  Well developed, well nourished, in no acute distress. Mouth:  edentulous. Lungs:  Clear bilaterally to auscultation and percussion. Heart:  Non-displaced PMI, chest non-tender; regular rate and rhythm, S1, S2 without murmurs, rubs or gallops. Carotid upstroke normal, no bruit. Normal abdominal aortic size, no bruits. Femorals normal pulses, no bruits. Pedals normal pulses. No edema, no varicosities. Abdomen:  Bowel sounds positive; abdomen soft and non-tender without masses, organomegaly, or hernias noted. No hepatosplenomegaly. Msk:  Back normal, normal gait. Muscle strength and tone normal. Pulses:  pulses normal in all 4 extremities Extremities:  No clubbing or cyanosis. Neurologic:  Alert and oriented x 3. Psych:  Normal affect.   EKG  Procedure date:  09/06/2010  Findings:      Normal sinus rhythm with rate of 79bpm:  Non-specific ST-T wave changes noted.    Impression & Recommendations:  Problem # 1:  CORONARY ARTERY BYPASS GRAFT, THREE VESSEL, HX OF (ICD-V45.81) He is stable from CV standpoint.  VSS, no symptoms.   He will continue his current medications with risk  reduction strategies. We have checked his orthostatics while he is here secondary to dizziness. He is negative.  We will see him in 6 months unless symptomatic.  Problem # 2:  HYPERLIPIDEMIA, MIXED (ICD-272.2) Assessment: Unchanged He is followed by Dr's of [facility] for this. He is advised to take pravachol as directed. His updated medication list for this problem includes:    Pravastatin Sodium 40 Mg Tabs (Pravastatin sodium) .Marland Kitchen... Take occasionally  Patient Instructions: 1)  Your physician recommends that you continue on your current medications as directed. Please refer to the Current Medication list given to you today. 2)  Your physician wants you to follow-up in: 6 months. You will receive a reminder letter in the mail about two months in advance. If you don't receive a letter, please call our office to schedule the follow-up appointment.

## 2010-09-12 ENCOUNTER — Ambulatory Visit: Payer: Self-pay | Admitting: Adult Health

## 2010-09-24 LAB — LIPID PANEL
HDL: 42 mg/dL (ref >39)
HDL: 47 mg/dL (ref >39)
LDL Cholesterol: 58 mg/dL (ref 0–99)
Triglycerides: 165 mg/dL — ABNORMAL HIGH (ref <150)
Triglycerides: 244 mg/dL — ABNORMAL HIGH (ref <150)
VLDL: 33 mg/dL (ref 0–40)
VLDL: 49 mg/dL — ABNORMAL HIGH (ref 0–40)

## 2010-09-24 LAB — POCT I-STAT, CHEM 8
Calcium, Ion: 1.12 mmol/L (ref 1.12–1.32)
Chloride: 102 mEq/L (ref 96–112)
HCT: 41 % (ref 39.0–52.0)
Potassium: 3.9 mEq/L (ref 3.5–5.1)
Sodium: 136 mEq/L (ref 135–145)

## 2010-09-24 LAB — GLUCOSE, CAPILLARY
Glucose-Capillary: 166 mg/dL — ABNORMAL HIGH (ref 70–99)
Glucose-Capillary: 179 mg/dL — ABNORMAL HIGH (ref 70–99)
Glucose-Capillary: 193 mg/dL — ABNORMAL HIGH (ref 70–99)
Glucose-Capillary: 195 mg/dL — ABNORMAL HIGH (ref 70–99)
Glucose-Capillary: 203 mg/dL — ABNORMAL HIGH (ref 70–99)
Glucose-Capillary: 224 mg/dL — ABNORMAL HIGH (ref 70–99)
Glucose-Capillary: 226 mg/dL — ABNORMAL HIGH (ref 70–99)
Glucose-Capillary: 230 mg/dL — ABNORMAL HIGH (ref 70–99)
Glucose-Capillary: 269 mg/dL — ABNORMAL HIGH (ref 70–99)

## 2010-09-24 LAB — BASIC METABOLIC PANEL
BUN: 11 mg/dL (ref 6–23)
Calcium: 8.9 mg/dL (ref 8.4–10.5)
Creatinine, Ser: 0.95 mg/dL (ref 0.4–1.5)
GFR calc Af Amer: >60 mL/min (ref >60)
GFR calc non Af Amer: >60 mL/min (ref >60)

## 2010-09-24 LAB — COMPREHENSIVE METABOLIC PANEL
ALT: 33 U/L (ref 0–53)
AST: 35 U/L (ref 0–37)
Albumin: 3.6 g/dL (ref 3.5–5.2)
Alkaline Phosphatase: 60 U/L (ref 39–117)
Chloride: 103 mEq/L (ref 96–112)
GFR calc Af Amer: >60 mL/min (ref >60)
Potassium: 3.6 mEq/L (ref 3.5–5.1)
Total Bilirubin: 0.4 mg/dL (ref 0.3–1.2)

## 2010-09-24 LAB — CBC
MCV: 93 fL (ref 78.0–100.0)
Platelets: 131 10*3/uL — ABNORMAL LOW (ref 150–400)
Platelets: 138 10*3/uL — ABNORMAL LOW (ref 150–400)
RBC: 4.37 MIL/uL (ref 4.22–5.81)
RBC: 4.4 MIL/uL (ref 4.22–5.81)
RDW: 12.5 % (ref 11.5–15.5)
WBC: 7.3 10*3/uL (ref 4.0–10.5)
WBC: 8 10*3/uL (ref 4.0–10.5)

## 2010-09-24 LAB — CARDIAC PANEL(CRET KIN+CKTOT+MB+TROPI)
CK, MB: 12.8 ng/mL (ref 0.3–4.0)
Total CK: 288 U/L — ABNORMAL HIGH (ref 7–232)
Total CK: 310 U/L — ABNORMAL HIGH (ref 7–232)
Troponin I: 2.42 ng/mL (ref 0.00–0.06)
Troponin I: 3.21 ng/mL (ref 0.00–0.06)

## 2010-09-24 LAB — HEPATIC FUNCTION PANEL
ALT: 27 U/L (ref 0–53)
AST: 27 U/L (ref 0–37)
Total Protein: 6.1 g/dL (ref 6.0–8.3)

## 2010-09-24 LAB — BRAIN NATRIURETIC PEPTIDE: Pro B Natriuretic peptide (BNP): 190 pg/mL — ABNORMAL HIGH (ref 0.0–100.0)

## 2010-09-26 LAB — COMPREHENSIVE METABOLIC PANEL
AST: 29 U/L (ref 0–37)
Albumin: 3.6 g/dL (ref 3.5–5.2)
Calcium: 8.7 mg/dL (ref 8.4–10.5)
Creatinine, Ser: 0.8 mg/dL (ref 0.4–1.5)
GFR calc Af Amer: >60 mL/min (ref >60)

## 2010-09-26 LAB — GLUCOSE, CAPILLARY
Glucose-Capillary: 140 mg/dL — ABNORMAL HIGH (ref 70–99)
Glucose-Capillary: 172 mg/dL — ABNORMAL HIGH (ref 70–99)
Glucose-Capillary: 179 mg/dL — ABNORMAL HIGH (ref 70–99)
Glucose-Capillary: 211 mg/dL — ABNORMAL HIGH (ref 70–99)
Glucose-Capillary: 229 mg/dL — ABNORMAL HIGH (ref 70–99)

## 2010-09-26 LAB — CBC
Hemoglobin: 13.1 g/dL (ref 13.0–17.0)
MCH: 32.4 pg (ref 26.0–34.0)
MCH: 32.8 pg (ref 26.0–34.0)
MCHC: 34.2 g/dL (ref 30.0–36.0)
MCHC: 34.4 g/dL (ref 30.0–36.0)
MCHC: 34.5 g/dL (ref 30.0–36.0)
MCV: 95.2 fL (ref 78.0–100.0)
Platelets: 137 10*3/uL — ABNORMAL LOW (ref 150–400)
Platelets: 142 10*3/uL — ABNORMAL LOW (ref 150–400)
Platelets: 144 10*3/uL — ABNORMAL LOW (ref 150–400)
RDW: 12.7 % (ref 11.5–15.5)
WBC: 7.6 10*3/uL (ref 4.0–10.5)

## 2010-09-26 LAB — BASIC METABOLIC PANEL
CO2: 23 mEq/L (ref 19–32)
Calcium: 9 mg/dL (ref 8.4–10.5)
Calcium: 9.2 mg/dL (ref 8.4–10.5)
Chloride: 106 mEq/L (ref 96–112)
Creatinine, Ser: 0.83 mg/dL (ref 0.4–1.5)
Creatinine, Ser: 0.97 mg/dL (ref 0.4–1.5)
GFR calc Af Amer: >60 mL/min (ref >60)
GFR calc Af Amer: >60 mL/min (ref >60)
GFR calc non Af Amer: >60 mL/min (ref >60)
GFR calc non Af Amer: >60 mL/min (ref >60)
Glucose, Bld: 197 mg/dL — ABNORMAL HIGH (ref 70–99)
Sodium: 134 mEq/L — ABNORMAL LOW (ref 135–145)

## 2010-09-26 LAB — DIFFERENTIAL
Basophils Absolute: 0 10*3/uL (ref 0.0–0.1)
Basophils Absolute: 0 10*3/uL (ref 0.0–0.1)
Basophils Relative: 0 % (ref 0–1)
Basophils Relative: 0 % (ref 0–1)
Eosinophils Absolute: 0.2 10*3/uL (ref 0.0–0.7)
Eosinophils Absolute: 0.3 10*3/uL (ref 0.0–0.7)
Eosinophils Relative: 3 % (ref 0–5)
Eosinophils Relative: 3 % (ref 0–5)
Lymphocytes Relative: 18 % (ref 12–46)
Lymphocytes Relative: 19 % (ref 12–46)
Lymphs Abs: 1 10*3/uL (ref 0.7–4.0)
Monocytes Absolute: 0.4 10*3/uL (ref 0.1–1.0)
Monocytes Relative: 9 % (ref 3–12)
Neutro Abs: 3.9 10*3/uL (ref 1.7–7.7)
Neutrophils Relative %: 65 % (ref 43–77)

## 2010-09-26 LAB — POCT CARDIAC MARKERS
Myoglobin, poc: 144 ng/mL (ref 12–200)
Troponin i, poc: <0.05 ng/mL (ref 0.00–0.09)

## 2010-09-26 LAB — CARDIAC PANEL(CRET KIN+CKTOT+MB+TROPI)
CK, MB: 11.7 ng/mL (ref 0.3–4.0)
CK, MB: 15.3 ng/mL (ref 0.3–4.0)
Relative Index: 4.3 — ABNORMAL HIGH (ref 0.0–2.5)
Total CK: 295 U/L — ABNORMAL HIGH (ref 7–232)
Total CK: 384 U/L — ABNORMAL HIGH (ref 7–232)
Troponin I: 0.01 ng/mL (ref 0.00–0.06)

## 2010-09-26 LAB — TSH: TSH: 2.047 u[IU]/mL (ref 0.350–4.500)

## 2010-09-26 LAB — VITAMIN B12: Vitamin B-12: 314 pg/mL (ref 211–911)

## 2010-09-26 LAB — URINALYSIS, ROUTINE W REFLEX MICROSCOPIC
Hgb urine dipstick: NEGATIVE
Nitrite: NEGATIVE
Protein, ur: NEGATIVE mg/dL
Specific Gravity, Urine: 1.015 (ref 1.005–1.030)
Urobilinogen, UA: 0.2 mg/dL (ref 0.0–1.0)

## 2010-09-26 LAB — LIPID PANEL
LDL Cholesterol: 98 mg/dL (ref 0–99)
Triglycerides: 396 mg/dL — ABNORMAL HIGH (ref <150)
VLDL: 79 mg/dL — ABNORMAL HIGH (ref 0–40)

## 2010-09-26 LAB — PHOSPHORUS: Phosphorus: 2.8 mg/dL (ref 2.3–4.6)

## 2010-09-26 LAB — BRAIN NATRIURETIC PEPTIDE: Pro B Natriuretic peptide (BNP): 40.8 pg/mL (ref 0.0–100.0)

## 2010-09-26 LAB — MAGNESIUM: Magnesium: 1.8 mg/dL (ref 1.5–2.5)

## 2010-10-16 LAB — URINALYSIS, ROUTINE W REFLEX MICROSCOPIC
Bilirubin Urine: NEGATIVE
Glucose, UA: NEGATIVE mg/dL
Hgb urine dipstick: NEGATIVE
Nitrite: NEGATIVE
Specific Gravity, Urine: 1.025 (ref 1.005–1.030)
Specific Gravity, Urine: 1.01 (ref 1.005–1.030)
pH: 7 (ref 5.0–8.0)
pH: 7 (ref 5.0–8.0)

## 2010-10-16 LAB — CBC
Hemoglobin: 15.6 g/dL (ref 13.0–17.0)
MCHC: 34.4 g/dL (ref 30.0–36.0)
MCV: 92.4 fL (ref 78.0–100.0)
RBC: 4.89 MIL/uL (ref 4.22–5.81)
WBC: 9.6 10*3/uL (ref 4.0–10.5)

## 2010-10-16 LAB — URINE MICROSCOPIC-ADD ON

## 2010-10-16 LAB — COMPREHENSIVE METABOLIC PANEL
ALT: 30 U/L (ref 0–53)
AST: 31 U/L (ref 0–37)
CO2: 21 mEq/L (ref 19–32)
Calcium: 9.7 mg/dL (ref 8.4–10.5)
Chloride: 100 mEq/L (ref 96–112)
GFR calc Af Amer: >60 mL/min (ref >60)
GFR calc non Af Amer: >60 mL/min (ref >60)
Glucose, Bld: 199 mg/dL — ABNORMAL HIGH (ref 70–99)
Sodium: 132 mEq/L — ABNORMAL LOW (ref 135–145)
Total Bilirubin: 0.8 mg/dL (ref 0.3–1.2)

## 2010-10-16 LAB — LIPASE, BLOOD: Lipase: 90 U/L — ABNORMAL HIGH (ref 11–59)

## 2010-10-16 LAB — URINE CULTURE

## 2010-10-16 LAB — DIFFERENTIAL
Basophils Absolute: 0 10*3/uL (ref 0.0–0.1)
Lymphocytes Relative: 13 % (ref 12–46)
Neutro Abs: 7.6 10*3/uL (ref 1.7–7.7)

## 2010-10-27 ENCOUNTER — Emergency Department (HOSPITAL_COMMUNITY)
Admission: EM | Admit: 2010-10-27 | Discharge: 2010-10-27 | Disposition: A | Discharge status: Home / Self Care | Payer: Medicare Other | Attending: Emergency Medicine | Admitting: Emergency Medicine

## 2010-10-27 DIAGNOSIS — L03211 Cellulitis of face: Secondary | ICD-10-CM | POA: Insufficient documentation

## 2010-10-27 DIAGNOSIS — L0201 Cutaneous abscess of face: Secondary | ICD-10-CM | POA: Insufficient documentation

## 2010-10-27 LAB — BASIC METABOLIC PANEL
BUN: 14 mg/dL (ref 6–23)
GFR calc non Af Amer: >60 mL/min (ref >60)
Potassium: 4.7 mEq/L (ref 3.5–5.1)
Sodium: 132 mEq/L — ABNORMAL LOW (ref 135–145)

## 2010-10-27 LAB — DIFFERENTIAL
Basophils Absolute: 0 10*3/uL (ref 0.0–0.1)
Eosinophils Absolute: 0.1 10*3/uL (ref 0.0–0.7)
Lymphocytes Relative: 12 % (ref 12–46)
Lymphs Abs: 1.3 10*3/uL (ref 0.7–4.0)
Neutrophils Relative %: 80 % — ABNORMAL HIGH (ref 43–77)

## 2010-10-27 LAB — CBC
MCV: 92.4 fL (ref 78.0–100.0)
Platelets: 160 10*3/uL (ref 150–400)
RBC: 4.32 MIL/uL (ref 4.22–5.81)
WBC: 10.6 10*3/uL — ABNORMAL HIGH (ref 4.0–10.5)

## 2010-10-28 ENCOUNTER — Emergency Department (HOSPITAL_COMMUNITY)
Admission: EM | Admit: 2010-10-28 | Discharge: 2010-10-28 | Disposition: A | Discharge status: Home / Self Care | Payer: Medicare Other | Attending: Emergency Medicine | Admitting: Emergency Medicine

## 2010-10-28 DIAGNOSIS — Z4801 Encounter for change or removal of surgical wound dressing: Secondary | ICD-10-CM | POA: Insufficient documentation

## 2010-10-28 DIAGNOSIS — L03211 Cellulitis of face: Secondary | ICD-10-CM | POA: Insufficient documentation

## 2010-10-28 DIAGNOSIS — L0201 Cutaneous abscess of face: Secondary | ICD-10-CM | POA: Insufficient documentation

## 2010-11-29 NOTE — Op Note (Signed)
NAME:  Jeffrey, [name] NO.:  [id number]   MEDICAL RECORD NO.:  [id number]          PATIENT TYPE:  EMS   LOCATION:  ED                            FACILITY:  [facility]   PHYSICIAN:  Kassie Mends, M.D.      DATE OF BIRTH:  10-08-1928   DATE OF PROCEDURE:  01/18/2006  DATE OF DISCHARGE:                                 OPERATIVE REPORT   PROCEDURE:  Esophagogastroduodenoscopy with removal of impacted food bolus.   MEDICATIONS:  1.  Demerol 25 mg IV.  2.  Versed 4 mg IV.   INDICATIONS:  Jeffrey [name] is a 77 year old male with difficulty swallowing  his secretions and food today.  He likely has an impacted food bolus.   FINDINGS:  1.  Impacted food bolus in the distal esophagus.  Food bolus partially      removed with retrieval basket.  Once the food bolus was decreased in      diameter, it slid through the EG junction spontaneously.  2.  Erythema of the EG junction.  No distinct ring or stricture.  No      intraluminal mass.  3.  Normal stomach, pylorus and duodenum.   RECOMMENDATIONS:  1.  Recommend CT scan of the chest and abdomen to rule out extraluminal      compression of the EG junction.  2.  The patient may follow up with me next week.  We will obtain an      esophageal manometry to evaluate for achalasia if the CT scan shows no      evidence of mass.  He may also have hypertensive LES.  3.  Strongly recommend and encourage soft mechanical diet until the etiology      of his dysphagia is known.   DESCRIPTION OF PROCEDURE:  Physical examination was performed and informed  consent was obtained from the patient after explaining all the risks  (perforation, bleeding, infection, and adverse effects of the medicine)  benefits and alternatives to the procedure which the patient appeared to  understand and so stated.  The patient was connected to the monitoring  device and placed on the left lateral position.  Continuous oxygen was  provided by nasal cannula and IV  medicine administered through an indwelling  cannula.  After administration of sedation as outlined above, the patient  was intubated and scope was advanced under direct visualization to the  second part of the  duodenum.  The scope was subsequently removed slowly by carefully examining  the color, texture, anatomy, and integrity of the mucosa on the way out.  The patient was recovered in the office and discharged to the floor in  satisfactory condition.      Kassie Mends, M.D.  Electronically Signed     SM/MEDQ  D:  01/18/2006  T:  01/19/2006  Job:  [id number]

## 2010-11-29 NOTE — Consult Note (Signed)
NAME:  Jeffrey [name], SKOG NO.:  [id number]   MEDICAL RECORD NO.:  [id number]          PATIENT TYPE:  EMS   LOCATION:  ED                            FACILITY:  [facility]   PHYSICIAN:  Kassie Mends, M.D.      DATE OF BIRTH:  Apr 11, 1929   DATE OF CONSULTATION:  01/18/2006  DATE OF DISCHARGE:                                   CONSULTATION   REASON FOR CONSULTATION:  Food impaction.   HISTORY OF PRESENT ILLNESS:  Jeffrey [name] is a 76 year old male who was in  his usual state of health when he awakened this morning and was not able to  swallow his food or pills. He has a prior history of difficulty swallowing  a long time ago. It resolved after he gagged. He denies any heartburn,  indigestion, or weight loss. He denies any abdominal pain, chest pain, or  shortness of breath.   PAST MEDICAL HISTORY:  Includes diabetes, hypertension, and coronary artery  disease.   PAST SURGICAL HISTORY:  Coronary artery bypass grafting and spinal fusion.   ALLERGIES:  NO KNOWN DRUG ALLERGIES.   MEDICATIONS:  Include Amaryl, Metformin, Altace, Tenormin, and aspirin 325  mg daily.   SOCIAL HISTORY:  He lives with his girlfriend of 20 years and a disabled  man. He denies any alcohol use. He chews tobacco.   REVIEW OF SYSTEMS:  Per the HPI. Otherwise, all systems are negative.   PHYSICAL EXAMINATION:  VITAL SIGNS:  He is afebrile and hemodynamically  stable. HEENT:  Normocephalic and atraumatic. Pupils are equal, round, and  reactive to light. Mouth, no oral lesions. Posterior pharynx without  erythema or exudate. NECK:  Has full range of motion and no  lymphadenopathy.LUNGS:  Clear to auscultation bilaterally.  CARDIOVASCULAR:  Regular rhythm. No murmur. Normal S1 and S2. ABDOMEN:  Bowel sounds present. Soft, nontender, and nondistended. NO rebound or  guarding.  EXTREMITIES:  Without clubbing, cyanosis, or edema.NEUROLOGIC:  He has no  focal neurologic deficits.   LABORATORY DATA:   Hemoglobin 15, creatinine 1.1.   IMPRESSION:  Jeffrey [name] is a 77 year old male with difficulty swallowing  his own secretions, likely secondary to an impacted food bolus. He has a low  likelihood of malignancy.  Thank you for allowing me to see Jeffrey [name] in consultation. My list of  recommendations to follow.   RECOMMENDATIONS:  I recommend EGD on today. He will be unable to have  esophageal dilatation due to his current aspirin use.   Please feel free to contact me for any questions.      Kassie Mends, M.D.  Electronically Signed     SM/MEDQ  D:  01/18/2006  T:  01/19/2006  Job:  [id number]

## 2011-03-06 ENCOUNTER — Other Ambulatory Visit: Payer: Self-pay | Admitting: Physician Assistant

## 2011-03-07 ENCOUNTER — Encounter: Payer: Self-pay | Admitting: Adult Health

## 2011-03-07 ENCOUNTER — Ambulatory Visit (INDEPENDENT_AMBULATORY_CARE_PROVIDER_SITE_OTHER): Payer: Medicare Other | Admitting: Adult Health

## 2011-03-07 DIAGNOSIS — E119 Type 2 diabetes mellitus without complications: Secondary | ICD-10-CM

## 2011-03-07 DIAGNOSIS — Z951 Presence of aortocoronary bypass graft: Secondary | ICD-10-CM

## 2011-03-07 DIAGNOSIS — E782 Mixed hyperlipidemia: Secondary | ICD-10-CM

## 2011-03-07 LAB — LIPID PANEL
Cholesterol: 242 mg/dL — ABNORMAL HIGH (ref 0–200)
HDL: 41 mg/dL (ref >39)
Total CHOL/HDL Ratio: 5.9 Ratio
Triglycerides: 405 mg/dL — ABNORMAL HIGH (ref <150)

## 2011-03-07 LAB — HEPATIC FUNCTION PANEL
ALT: 24 U/L (ref 0–53)
AST: 24 U/L (ref 0–37)
Albumin: 4.5 g/dL (ref 3.5–5.2)
Total Bilirubin: 0.3 mg/dL (ref 0.3–1.2)
Total Protein: 7.1 g/dL (ref 6.0–8.3)

## 2011-03-07 NOTE — Assessment & Plan Note (Signed)
He states his BG is running over 200 at home. He checks this 3 times a day. He states he likes to eat too much. I have advised him to check with is PCP about medication adjustments to keep BG better controlled. He verbalizes understanding. He does not, however, seem motivated to change his eating habits.

## 2011-03-07 NOTE — Assessment & Plan Note (Signed)
He is having nonspecific complaints of fatigue and heart burn.  He refuses follow-up stress test. He wants to continue medical therapy only.  He states he is 77 y/o and happy.  He does not want anymore testing completed.  He will be continued on current medication regimen. He is taking two different doses of ASA. When questioned about this, he states it help him with arthritis pain in his neck.  He does not want to change the dose of this either.

## 2011-03-07 NOTE — Patient Instructions (Signed)
Your physician recommends that you schedule a follow-up appointment in: 6 months  

## 2011-03-07 NOTE — Progress Notes (Signed)
HPI:  [name] is an 77 y/o patient of Dr. Dietrich Pates we are seeing for ongoing assessment and treatment of CAD, with history of CABG, stent to proximal SVG to L Cx., hypertension, mixed hyperlipidemia, and diabetes. He comes today without cardiac complaint. He states that has been feeling bad the last couple of days with nonspecific complaints. He has had some "heartburn" the last couple of day that has been relieved with Tums. He has refused follow-up stress tests in the past.  He states that he knows people who have died during a stress test and he does not want to take the risk   He was seen by Dr. Loreta Ave yesterday for his complaints and blood was drawn.  He admits to dietary noncompliance with diabetic diet.  He states he eats at his church for lunch everyday which is catered by [employer].  No Known Allergies  Current Outpatient Prescriptions  Medication Sig Dispense Refill  . aspirin 325 MG tablet Take 325 mg by mouth. 1/2 po bid       . aspirin 81 MG tablet Take 81 mg by mouth daily.        . carvedilol (COREG) 6.25 MG tablet Take 6.25 mg by mouth 2 (two) times daily with a meal.        . clopidogrel (PLAVIX) 75 MG tablet Take 75 mg by mouth daily.        . metFORMIN (GLUMETZA) 500 MG (MOD) 24 hr tablet Take 500 mg by mouth 3 (three) times daily.        . nabumetone (RELAFEN) 500 MG tablet Take 500 mg by mouth 2 (two) times daily.        . nitrofurantoin, macrocrystal-monohydrate, (MACROBID) 100 MG capsule Take 100 mg by mouth daily.        . pantoprazole (PROTONIX) 40 MG tablet Take 40 mg by mouth daily.        . ramipril (ALTACE) 10 MG tablet Take 10 mg by mouth daily.        . Tamsulosin HCl (FLOMAX) 0.4 MG CAPS Take 0.4 mg by mouth daily.          Past Medical History  Diagnosis Date  . Coronary artery disease   . Diabetes mellitus   . Hypertension   . PUD (peptic ulcer disease)     Past Surgical History  Procedure Date  . Tonsillectomy   . Post aortocoronary bypass surgery      GEX:BMWUXL of systems complete and found to be negative unless listed above PHYSICAL EXAM BP 126/78  Pulse 80  Resp 16  Ht 5\' 11"  (1.803 m)  Wt 190 lb (86.183 kg)  BMI 26.50 kg/m2 General: Well developed, well nourished, in no acute distress Head: Eyes PERRLA, No xanthomas.   Normal cephalic and atramatic  Lungs: Clear bilaterally to auscultation and percussion. Heart: HRRR S1 S2,distant heart sounds.  Pulses are 2+ & equal.            No carotid bruit. No JVD.  No abdominal bruits. No femoral bruits. Abdomen: Bowel sounds are positive, abdomen soft and non-tender without masses or                  Hernia's noted. Msk:  Back normal, normal gait. Normal strength and tone for age. Extremities: No clubbing, cyanosis or edema.  DP +1 Neuro: Alert and oriented X 3. Very hard of hearing. Psych:  Good affect, responds appropriately  EKG: NSR rate of 80 bpm.  Nonspecific infero/lateral T was abnormalities, unchanged from previous EKG in Feb 2012.  ASSESSMENT AND PLAN

## 2011-03-07 NOTE — Assessment & Plan Note (Signed)
Will add lipids and LFT's to blood drawn on 03/06/2011.  He is not on a statin,despite CAD. He had been on fish oil in the past. He should be on a statin, but refuses this until lab values are back.  I suspect the values are elevated and he will need some statin protection. This may be hard to convince him to take, however.

## 2011-03-11 ENCOUNTER — Telehealth: Payer: Self-pay | Admitting: Adult Health

## 2011-03-11 NOTE — Telephone Encounter (Signed)
Pt came in today was seen last week and told that we would get his labs from [facility] and go over them with him.

## 2011-03-13 ENCOUNTER — Other Ambulatory Visit: Payer: Self-pay

## 2011-03-13 MED ORDER — ROSUVASTATIN CALCIUM 10 MG PO TABS: 10.0000 mg | ORAL_TABLET | Freq: Every day | ORAL | Status: DC

## 2011-03-14 NOTE — Telephone Encounter (Signed)
Labs results were reviewed and discuss with pt in office with Nurse Larita Fife Via.

## 2011-04-02 ENCOUNTER — Emergency Department (HOSPITAL_COMMUNITY): Payer: Medicare Other

## 2011-04-02 ENCOUNTER — Other Ambulatory Visit: Payer: Self-pay

## 2011-04-02 ENCOUNTER — Inpatient Hospital Stay (HOSPITAL_COMMUNITY)
Admission: EM | Admit: 2011-04-02 | Discharge: 2011-04-04 | DRG: 194 | Disposition: A | Discharge status: Home Health | Payer: Medicare Other | Attending: Internal Medicine | Admitting: Internal Medicine

## 2011-04-02 ENCOUNTER — Encounter (HOSPITAL_COMMUNITY): Payer: Self-pay | Admitting: *Deleted

## 2011-04-02 DIAGNOSIS — I2510 Atherosclerotic heart disease of native coronary artery without angina pectoris: Secondary | ICD-10-CM | POA: Diagnosis present

## 2011-04-02 DIAGNOSIS — Z951 Presence of aortocoronary bypass graft: Secondary | ICD-10-CM

## 2011-04-02 DIAGNOSIS — J189 Pneumonia, unspecified organism: Principal | ICD-10-CM | POA: Diagnosis present

## 2011-04-02 DIAGNOSIS — R748 Abnormal levels of other serum enzymes: Secondary | ICD-10-CM | POA: Diagnosis present

## 2011-04-02 DIAGNOSIS — J069 Acute upper respiratory infection, unspecified: Secondary | ICD-10-CM

## 2011-04-02 DIAGNOSIS — E785 Hyperlipidemia, unspecified: Secondary | ICD-10-CM | POA: Diagnosis present

## 2011-04-02 DIAGNOSIS — M6282 Rhabdomyolysis: Secondary | ICD-10-CM | POA: Diagnosis present

## 2011-04-02 DIAGNOSIS — E782 Mixed hyperlipidemia: Secondary | ICD-10-CM

## 2011-04-02 DIAGNOSIS — E119 Type 2 diabetes mellitus without complications: Secondary | ICD-10-CM | POA: Diagnosis present

## 2011-04-02 DIAGNOSIS — K219 Gastro-esophageal reflux disease without esophagitis: Secondary | ICD-10-CM

## 2011-04-02 DIAGNOSIS — R7989 Other specified abnormal findings of blood chemistry: Secondary | ICD-10-CM

## 2011-04-02 LAB — CARDIAC PANEL(CRET KIN+CKTOT+MB+TROPI)
Relative Index: 2.6 — ABNORMAL HIGH (ref 0.0–2.5)
Relative Index: 2.6 — ABNORMAL HIGH (ref 0.0–2.5)
Total CK: 641 U/L — ABNORMAL HIGH (ref 7–232)
Total CK: 407 U/L — ABNORMAL HIGH (ref 7–232)
Troponin I: <0.3 ng/mL (ref <0.30)

## 2011-04-02 LAB — BASIC METABOLIC PANEL
BUN: 12 mg/dL (ref 6–23)
Calcium: 10 mg/dL (ref 8.4–10.5)
Creatinine, Ser: 0.74 mg/dL (ref 0.50–1.35)
GFR calc Af Amer: >60 mL/min (ref >60)

## 2011-04-02 LAB — HEPATIC FUNCTION PANEL
ALT: 29 U/L (ref 0–53)
AST: 27 U/L (ref 0–37)
Albumin: 3.9 g/dL (ref 3.5–5.2)
Alkaline Phosphatase: 76 U/L (ref 39–117)
Total Bilirubin: 0.4 mg/dL (ref 0.3–1.2)

## 2011-04-02 LAB — DIFFERENTIAL
Basophils Absolute: 0 10*3/uL (ref 0.0–0.1)
Basophils Relative: 0 % (ref 0–1)
Lymphocytes Relative: 8 % — ABNORMAL LOW (ref 12–46)
Neutro Abs: 11.6 10*3/uL — ABNORMAL HIGH (ref 1.7–7.7)
Neutrophils Relative %: 82 % — ABNORMAL HIGH (ref 43–77)

## 2011-04-02 LAB — URINALYSIS, ROUTINE W REFLEX MICROSCOPIC
Bilirubin Urine: NEGATIVE
Glucose, UA: NEGATIVE mg/dL
Ketones, ur: NEGATIVE mg/dL
Leukocytes, UA: NEGATIVE
Nitrite: NEGATIVE
Protein, ur: NEGATIVE mg/dL
pH: 8 (ref 5.0–8.0)

## 2011-04-02 LAB — CBC
MCHC: 34.1 g/dL (ref 30.0–36.0)
Platelets: 133 10*3/uL — ABNORMAL LOW (ref 150–400)
RDW: 12.5 % (ref 11.5–15.5)
WBC: 14.3 10*3/uL — ABNORMAL HIGH (ref 4.0–10.5)

## 2011-04-02 LAB — GLUCOSE, CAPILLARY
Glucose-Capillary: 204 mg/dL — ABNORMAL HIGH (ref 70–99)
Glucose-Capillary: 155 mg/dL — ABNORMAL HIGH (ref 70–99)

## 2011-04-02 MED ORDER — RAMIPRIL 10 MG PO CAPS
10.0000 mg | ORAL_CAPSULE | Freq: Every day | ORAL | Status: DC
  Administered 2011-04-02 – 2011-04-04 (×3): 10 mg via ORAL
  Filled 2011-04-02: qty 2
  Filled 2011-04-02: qty 1
  Filled 2011-04-02 (×2): qty 2
  Filled 2011-04-02 (×2): qty 1

## 2011-04-02 MED ORDER — ENOXAPARIN SODIUM 40 MG/0.4ML ~~LOC~~ SOLN
40.0000 mg | SUBCUTANEOUS | Status: DC
  Administered 2011-04-02 – 2011-04-03 (×2): 40 mg via SUBCUTANEOUS
  Filled 2011-04-02 (×2): qty 0.4

## 2011-04-02 MED ORDER — DEXTROSE 5 % IV SOLN
1.0000 g | Freq: Once | INTRAVENOUS | Status: AC
  Administered 2011-04-02: 1 g via INTRAVENOUS
  Filled 2011-04-02: qty 1

## 2011-04-02 MED ORDER — ONDANSETRON HCL 4 MG PO TABS: 4.0000 mg | ORAL_TABLET | Freq: Four times a day (QID) | ORAL | Status: DC | PRN

## 2011-04-02 MED ORDER — HYDROCODONE-ACETAMINOPHEN 5-325 MG PO TABS: 1.0000 | ORAL_TABLET | ORAL | Status: DC | PRN

## 2011-04-02 MED ORDER — NITROGLYCERIN 0.4 MG SL SUBL: 0.4000 mg | SUBLINGUAL_TABLET | SUBLINGUAL | Status: DC | PRN

## 2011-04-02 MED ORDER — INSULIN GLARGINE 100 UNIT/ML ~~LOC~~ SOLN
8.0000 [IU] | Freq: Every day | SUBCUTANEOUS | Status: DC
  Administered 2011-04-02 – 2011-04-03 (×2): 8 [IU] via SUBCUTANEOUS
  Filled 2011-04-02: qty 3

## 2011-04-02 MED ORDER — INSULIN ASPART 100 UNIT/ML ~~LOC~~ SOLN
0.0000 [IU] | Freq: Three times a day (TID) | SUBCUTANEOUS | Status: DC
  Administered 2011-04-02: 5 [IU] via SUBCUTANEOUS
  Administered 2011-04-03 (×2): 3 [IU] via SUBCUTANEOUS
  Administered 2011-04-03: 5 [IU] via SUBCUTANEOUS
  Administered 2011-04-04 (×2): 3 [IU] via SUBCUTANEOUS

## 2011-04-02 MED ORDER — BISACODYL 10 MG RE SUPP: 10.0000 mg | Freq: Every day | RECTAL | Status: DC | PRN

## 2011-04-02 MED ORDER — TAMSULOSIN HCL 0.4 MG PO CAPS
0.4000 mg | ORAL_CAPSULE | Freq: Every day | ORAL | Status: DC
  Administered 2011-04-02 – 2011-04-03 (×2): 0.4 mg via ORAL
  Filled 2011-04-02 (×2): qty 1

## 2011-04-02 MED ORDER — INSULIN ASPART 100 UNIT/ML ~~LOC~~ SOLN
SUBCUTANEOUS | Status: AC
  Filled 2011-04-02: qty 3

## 2011-04-02 MED ORDER — NITROFURANTOIN MONOHYD MACRO 100 MG PO CAPS
ORAL_CAPSULE | ORAL | Status: AC
  Filled 2011-04-02: qty 1

## 2011-04-02 MED ORDER — INSULIN ASPART 100 UNIT/ML ~~LOC~~ SOLN
0.0000 [IU] | Freq: Every day | SUBCUTANEOUS | Status: DC
  Administered 2011-04-03: 2 [IU] via SUBCUTANEOUS

## 2011-04-02 MED ORDER — DEXTROSE 5 % IV SOLN
500.0000 mg | Freq: Once | INTRAVENOUS | Status: AC
  Administered 2011-04-02: 500 mg via INTRAVENOUS
  Filled 2011-04-02: qty 500

## 2011-04-02 MED ORDER — ACETAMINOPHEN 325 MG PO TABS
650.0000 mg | ORAL_TABLET | Freq: Four times a day (QID) | ORAL | Status: DC | PRN
Start: 2011-04-02 — End: 2011-04-04

## 2011-04-02 MED ORDER — ONDANSETRON HCL 4 MG/2ML IJ SOLN: 4.0000 mg | Freq: Four times a day (QID) | INTRAMUSCULAR | Status: DC | PRN

## 2011-04-02 MED ORDER — ACETAMINOPHEN 650 MG RE SUPP: 650.0000 mg | Freq: Four times a day (QID) | RECTAL | Status: DC | PRN

## 2011-04-02 MED ORDER — SODIUM CHLORIDE 0.9 % IV SOLN
INTRAVENOUS | Status: DC
  Administered 2011-04-02: 600 mL via INTRAVENOUS
  Administered 2011-04-02: via INTRAVENOUS
  Administered 2011-04-03: 1000 mL via INTRAVENOUS

## 2011-04-02 MED ORDER — PANTOPRAZOLE SODIUM 40 MG PO TBEC
40.0000 mg | DELAYED_RELEASE_TABLET | Freq: Every day | ORAL | Status: DC
  Administered 2011-04-02 – 2011-04-04 (×3): 40 mg via ORAL
  Filled 2011-04-02 (×3): qty 1

## 2011-04-02 MED ORDER — CLOPIDOGREL BISULFATE 75 MG PO TABS
75.0000 mg | ORAL_TABLET | Freq: Every day | ORAL | Status: DC
  Administered 2011-04-02 – 2011-04-04 (×3): 75 mg via ORAL
  Filled 2011-04-02 (×3): qty 1

## 2011-04-02 MED ORDER — NITROFURANTOIN MONOHYD MACRO 100 MG PO CAPS
100.0000 mg | ORAL_CAPSULE | Freq: Every day | ORAL | Status: DC
Start: 2011-04-02 — End: 2011-04-04
  Administered 2011-04-02 – 2011-04-03 (×2): 100 mg via ORAL
  Filled 2011-04-02 (×5): qty 1

## 2011-04-02 MED ORDER — CARVEDILOL 3.125 MG PO TABS
6.2500 mg | ORAL_TABLET | Freq: Two times a day (BID) | ORAL | Status: DC
  Administered 2011-04-02 – 2011-04-04 (×4): 6.25 mg via ORAL
  Filled 2011-04-02 (×4): qty 2

## 2011-04-02 MED ORDER — ASPIRIN 81 MG PO CHEW
81.0000 mg | CHEWABLE_TABLET | Freq: Every day | ORAL | Status: DC
  Administered 2011-04-02 – 2011-04-04 (×3): 81 mg via ORAL
  Filled 2011-04-02 (×3): qty 1

## 2011-04-02 MED ORDER — SODIUM CHLORIDE 0.9 % IV SOLN
INTRAVENOUS | Status: DC
  Administered 2011-04-02: 10:00:00 via INTRAVENOUS

## 2011-04-02 NOTE — ED Notes (Signed)
Called to give report. Nurse unavailable. Awaiting return call 

## 2011-04-02 NOTE — ED Notes (Signed)
Family at bedside. Patient would like something to eat states he hasn't eaten all day. RN Jill Alexanders aware told patient that we would wait for orders and then get him something to eat.

## 2011-04-02 NOTE — ED Provider Notes (Deleted)
Medical screening examination/treatment/procedure(s) were conducted as a shared visit with non-physician practitioner(s) and myself.  I personally evaluated the patient during the encounter  Nicholes Stairs, MD 04/02/11 1433

## 2011-04-02 NOTE — ED Notes (Signed)
Pt states that he got up this am and went to eat his breakfast, did not feel well, states that he "felt bloated" pt states that he went to use the bathroom where he had a bowel movement, felt "some better" afterwards and took an alka seltzer pt  arrived to er via ems with generalized weakness. Denies any pain, admits to nausea.

## 2011-04-02 NOTE — ED Notes (Signed)
See triage note.

## 2011-04-02 NOTE — ED Provider Notes (Signed)
Medical screening examination/treatment/procedure(s) were conducted as a shared visit with non-physician practitioner(s) and myself.  I personally evaluated the patient during the encounter  Nicholes Stairs, MD 04/02/11 1331

## 2011-04-02 NOTE — Progress Notes (Signed)
Pt's skin assessment completed upon arrival to floor. Pt has scattered moles to forehead, back, bilateral arms and chest. Pt has scattered scabs to bilateral arms. Dry, flaky skin noted to bilateral legs and feet. All skin intact. Assessment completed by Dagoberto Ligas, RN and Quita Skye, RN

## 2011-04-02 NOTE — ED Notes (Signed)
CRITICAL VALUE ALERT  Critical value received:  ckmb 16.4  Date of notification:  04/02/2011  Time of notification: 09:26  Critical value read back: yes  Nurse who received alert:  Juliette Alcide, RN  MD notified (1st page):  Burgess Amor PA   Time of first page:  09:27  MD notified (2nd page):  Time of second page:  Responding MD:  Burgess Amor, PA   Time MD responded:  09:27

## 2011-04-02 NOTE — H&P (Signed)
Hospital Admission Note Date: 04/02/2011  Patient name: Jeffrey [name] Medical record number: [id number] Date of birth: 09-14-1928 Age: 77 y.o. Gender: male PCP: [name], MD  Attending physician: Kela Millin  Chief Complaint: Generalized malaise/weakness  History of Present Illness: The patient is a pleasant 77 year old white male with a past medical history significant for diabetes mellitus, coronary artery disease, GERD, hyperlipidemia, lives alone and presents with above complaints. He states that he was in his usual state of health until a few days ago when she began having generalized O. weaknes and generalized malaise, and it got worse and today he just felt so bad he was unable to eat breakfast. He denies cough, fevers, chest pain, shortness of breath, dysuria, melena, and hematochezia. She was seen in the ER and a chest x-ray was done and and shows right lower lobe infiltrate question pneumonia. Cardiac enzymes were also done and the CPKs was elevated with normal troponins at. He is admitted for further evaluation and management.   Meds: Prescriptions prior to admission  Medication Sig Dispense Refill  . aspirin 325 MG tablet Take 325 mg by mouth 2 (two) times daily.        . carvedilol (COREG) 6.25 MG tablet Take 6.25 mg by mouth 2 (two) times daily with a meal.       . clopidogrel (PLAVIX) 75 MG tablet Take 75 mg by mouth daily.       . metFORMIN (GLUCOPHAGE) 500 MG tablet Take 500 mg by mouth 2 (two) times daily as needed. Sugar high       . nabumetone (RELAFEN) 500 MG tablet Take 500 mg by mouth 2 (two) times daily.       . nitrofurantoin, macrocrystal-monohydrate, (MACROBID) 100 MG capsule Take 100 mg by mouth at bedtime.       . pantoprazole (PROTONIX) 40 MG tablet Take 40 mg by mouth daily.        . ramipril (ALTACE) 10 MG tablet Take 10 mg by mouth daily.       . rosuvastatin (CRESTOR) 10 MG tablet Take 1 tablet (10 mg total) by mouth daily. New medication   30 tablet  6  . Tamsulosin HCl (FLOMAX) 0.4 MG CAPS Take 0.4 mg by mouth at bedtime.       Marland Kitchen aspirin 325 MG tablet Take 325 mg by mouth. 1/2 po bid       . aspirin 81 MG tablet Take 81 mg by mouth daily.       . metFORMIN (GLUMETZA) 500 MG (MOD) 24 hr tablet Take 500 mg by mouth 3 (three) times daily.       . nitroGLYCERIN (NITROSTAT) 0.4 MG SL tablet Place 0.4 mg under the tongue every 5 (five) minutes as needed. Chest pain/patient said he has never had to use medication as he takes Rolaids if he gets chest pain and that stops pain        Allergies: Review of patient's allergies indicates no known allergies. Past Medical History  Diagnosis Date  . Coronary artery disease   . Diabetes mellitus   . Hypertension   . PUD (peptic ulcer disease)   . Hypercholesteremia   . Myocardial infarct, old    Past Surgical History  Procedure Date  . Tonsillectomy   . Post aortocoronary bypass surgery   . Coronary artery bypass graft    History reviewed. No pertinent family history, states his parents died young and he does not know the medical history.  History   Social History  . Marital Status: Divorced    Spouse Name: N/A    Number of Children: N/A  . Years of Education: N/A   Occupational History  . retired     Visual merchandiser   Social History Main Topics  . Smoking status: Former Smoke,r quit tobacco about 40 years ago.     Types: Cigarettes  . Smokeless tobacco: Current User    Types: Chew  . Alcohol Use: No  . Drug Use: No  . Sexually Active: Not on file   Other Topics Concern  . Not on file   Social History Narrative  . No narrative on file   Review of Systems: Review of Systems  Constitutional: Positive for malaise/fatigue. Negative for fever, chills, weight loss and diaphoresis.       States he broke out in sweat this am, but denies fevers or chills.  HENT: Negative for hearing loss, nosebleeds, congestion, sore throat, neck pain and tinnitus.   Eyes: Negative for blurred  vision, double vision, photophobia, discharge and redness.  Respiratory: Negative for cough, hemoptysis, sputum production, shortness of breath, wheezing and stridor.   Cardiovascular: Negative for chest pain, palpitations, orthopnea, claudication, leg swelling and PND.  Gastrointestinal: Negative for heartburn, nausea, vomiting, abdominal pain, diarrhea, constipation, blood in stool and melena.       States he did not eat this am, just b/c he felt bad overall.  Genitourinary: Negative for dysuria, urgency, frequency, hematuria and flank pain.  Musculoskeletal: Negative for myalgias, back pain, joint pain and falls.  Skin: Negative for rash.  Neurological: Positive for weakness. Negative for dizziness, tingling, tremors, sensory change, speech change, focal weakness, seizures and headaches.  Endo/Heme/Allergies: Negative for environmental allergies and polydipsia. Does not bruise/bleed easily.  Psychiatric/Behavioral: Negative for depression, suicidal ideas, hallucinations and substance abuse. The patient is not nervous/anxious and does not have insomnia.      Physical Exam: Blood pressure 114/70, pulse 76, temperature 98.4 F (36.9 C), temperature source Oral, resp. rate 18, height 5\' 10"  (1.778 m), weight 81.738 kg (180 lb 3.2 oz), SpO2 91.00%.  BP 114/70  Pulse 76  Temp(Src) 98.4 F (36.9 C) (Oral)  Resp 18  Ht 5\' 10"  (1.778 m)  Wt 81.738 kg (180 lb 3.2 oz)  BMI 25.86 kg/m2  SpO2 91%  General Appearance:    Alert, cooperative, no distress, appears stated age  Head:    Normocephalic, without obvious abnormality, atraumatic  Eyes:    PERRL, conjunctiva/corneas clear, EOM's intact, fundi    benign, both eyes       Nose:   Nares normal, septum midline, mucosa normal, no drainage   or sinus tenderness  Throat:   Lips, mucosa, and tongue normal; edentulous  Neck:   Supple, symmetrical, trachea midline, no adenopathy;       thyroid:  No enlargement/tenderness/nodules; no carotid    bruit or JVD  Back:     Symmetric, no curvature, ROM normal, no CVA tenderness  Lungs:     Decreased BS in bases, respirations unlabored  Chest wall:    No tenderness or deformity  Heart:    Regular rate and rhythm, S1 and S2 normal, no murmur, rub   or gallop  Abdomen:     Soft, non-tender, bowel sounds active all four quadrants,    no masses, no organomegaly  Extremities:   Extremities normal, atraumatic, no cyanosis or edema  Neurologic:   CNII-XII intact. Normal strength, sensation and reflexes  throughout    Lab results: Results for orders placed during the hospital encounter of 04/02/11 (from the past 24 hour(s))  HEPATIC FUNCTION PANEL     Status: Normal   Collection Time   04/02/11  8:04 AM      Component Value Range   Total Protein 6.7  6.0 - 8.3 (g/dL)   Albumin 3.9  3.5 - 5.2 (g/dL)   AST 27  0 - 37 (U/L)   ALT 29  0 - 53 (U/L)   Alkaline Phosphatase 76  39 - 117 (U/L)   Total Bilirubin 0.4  0.3 - 1.2 (mg/dL)   Bilirubin, Direct 0.1  0.0 - 0.3 (mg/dL)   Indirect Bilirubin 0.3  0.3 - 0.9 (mg/dL)  URINALYSIS, ROUTINE W REFLEX MICROSCOPIC     Status: Abnormal   Collection Time   04/02/11  8:45 AM      Component Value Range   Color, Urine YELLOW  YELLOW    Appearance HAZY (*) CLEAR    Specific Gravity, Urine 1.015  1.005 - 1.030    pH 8.0  5.0 - 8.0    Glucose, UA NEGATIVE  NEGATIVE (mg/dL)   Hgb urine dipstick NEGATIVE  NEGATIVE    Bilirubin Urine NEGATIVE  NEGATIVE    Ketones, ur NEGATIVE  NEGATIVE (mg/dL)   Protein, ur NEGATIVE  NEGATIVE (mg/dL)   Urobilinogen, UA 0.2  0.0 - 1.0 (mg/dL)   Nitrite NEGATIVE  NEGATIVE    Leukocytes, UA NEGATIVE  NEGATIVE   CBC     Status: Abnormal   Collection Time   04/02/11  8:49 AM      Component Value Range   WBC 14.3 (*) 4.0 - 10.5 (K/uL)   RBC 4.45  4.22 - 5.81 (MIL/uL)   Hemoglobin 13.9  13.0 - 17.0 (g/dL)   HCT 78.2  95.6 - 21.3 (%)   MCV 91.7  78.0 - 100.0 (fL)   MCH 31.2  26.0 - 34.0 (pg)   MCHC 34.1  30.0 - 36.0  (g/dL)   RDW 08.6  57.8 - 46.9 (%)   Platelets 133 (*) 150 - 400 (K/uL)  DIFFERENTIAL     Status: Abnormal   Collection Time   04/02/11  8:49 AM      Component Value Range   Neutrophils Relative 82 (*) 43 - 77 (%)   Neutro Abs 11.6 (*) 1.7 - 7.7 (K/uL)   Lymphocytes Relative 8 (*) 12 - 46 (%)   Lymphs Abs 1.1  0.7 - 4.0 (K/uL)   Monocytes Relative 9  3 - 12 (%)   Monocytes Absolute 1.3 (*) 0.1 - 1.0 (K/uL)   Eosinophils Relative 2  0 - 5 (%)   Eosinophils Absolute 0.2  0.0 - 0.7 (K/uL)   Basophils Relative 0  0 - 1 (%)   Basophils Absolute 0.0  0.0 - 0.1 (K/uL)  CARDIAC PANEL(CRET KIN+CKTOT+MB+TROPI)     Status: Abnormal   Collection Time   04/02/11  8:49 AM      Component Value Range   Total CK 641 (*) 7 - 232 (U/L)   CK, MB 16.4 (*) 0.3 - 4.0 (ng/mL)   Troponin I <0.30  <0.30 (ng/mL)   Relative Index 2.6 (*) 0.0 - 2.5   BASIC METABOLIC PANEL     Status: Abnormal   Collection Time   04/02/11  8:49 AM      Component Value Range   Sodium 135  135 - 145 (mEq/L)   Potassium 4.2  3.5 -  5.1 (mEq/L)   Chloride 100  96 - 112 (mEq/L)   CO2 24  19 - 32 (mEq/L)   Glucose, Bld 186 (*) 70 - 99 (mg/dL)   BUN 12  6 - 23 (mg/dL)   Creatinine, Ser 1.61  0.50 - 1.35 (mg/dL)   Calcium 09.6  8.4 - 10.5 (mg/dL)   GFR calc non Af Amer >60  >60 (mL/min)   GFR calc Af Amer >60  >60 (mL/min)     Imaging results- CXR  IMPRESSION:  Right lower lobe infiltrate question pneumonia.  Question 6 mm nonobstructing right renal calculus.  No acute abdominal findings.   Assessment & Plan by Problem:  1.Principal Problem:  *Community acquired pneumonia - as discussed above, and will start on empiric antibiotics and follow Active Problems: 2. DM- will monitor Accu-Cheks, hold off metformin for now and treat with insulin. 3. HYPERLIPIDEMIA, MIXED- we'll hold Crestor for now secondary to elevated CPK 4.GERD- we'll place on a PPI 5. Elevated CPK- rhabdo secondary to question Crestor versus cardiac,  will cycle cardiac enzymes,, hydrate with IV fluids, follow and recheck. 6.Coronary artery disease - status post CABG in the past, and stents in January of 2012. We'll continue outpatient medications, cycle cardiac enzymes as above and consider cardiology consultation pending results.   Valetta Mulroy C 04/02/2011, 3:08 PM

## 2011-04-02 NOTE — ED Notes (Signed)
Dr. Weldon Inches notified of lab results, in to see pt

## 2011-04-02 NOTE — ED Provider Notes (Addendum)
History     CSN: [id number] Arrival date & time: 04/02/2011  8:29 AM   Chief Complaint  Patient presents with  . Weakness     (Include location/radiation/quality/duration/timing/severity/associated sxs/prior treatment) Patient is a 77 y.o. male presenting with weakness. The history is provided by the patient.  Weakness The primary symptoms include nausea. Primary symptoms do not include headaches, dizziness or fever. Primary symptoms comment: He describes a vague weakness and nausea since he woke this am.   The symptoms began 1 to 2 hours ago (He has history of constipation and took prune juice and a laxative before going to bed last night.  He woke today feeling bloated.  He had a bm which helped his bloating feeling,  but still feels nauseated and weak.).  Additional symptoms include weakness. Medical issues also include diabetes and hypertension. Workup history includes cardiac workup. Procedure history comments: Has history of coronary disease and MI.  Denies chest pain and sob today..     Past Medical History  Diagnosis Date  . Coronary artery disease   . Diabetes mellitus   . Hypertension   . PUD (peptic ulcer disease)   . Hypercholesteremia   . Myocardial infarct, old      Past Surgical History  Procedure Date  . Tonsillectomy   . Post aortocoronary bypass surgery   . Coronary artery bypass graft     No family history on file.  History  Substance Use Topics  . Smoking status: Former Smoker    Types: Cigarettes  . Smokeless tobacco: Current User    Types: Chew  . Alcohol Use: No      Review of Systems  Constitutional: Negative for fever.  HENT: Negative for congestion, sore throat and neck pain.   Eyes: Negative.   Respiratory: Negative for cough, chest tightness, shortness of breath and wheezing.   Cardiovascular: Negative for chest pain.  Gastrointestinal: Positive for nausea, constipation and abdominal distention. Negative for abdominal pain.    Genitourinary: Negative.   Musculoskeletal: Negative for joint swelling and arthralgias.  Skin: Negative.  Negative for rash and wound.  Neurological: Positive for weakness. Negative for dizziness, light-headedness, numbness and headaches.  Hematological: Negative.   Psychiatric/Behavioral: Negative.     Allergies  Review of patient's allergies indicates no known allergies.  Home Medications   Current Outpatient Rx  Name Route Sig Dispense Refill  . ASPIRIN 325 MG PO TABS Oral Take 325 mg by mouth. 1/2 po bid     . ASPIRIN 81 MG PO TABS Oral Take 81 mg by mouth daily.     Marland Kitchen CARVEDILOL 6.25 MG PO TABS Oral Take 6.25 mg by mouth 2 (two) times daily with a meal.     . CLOPIDOGREL BISULFATE 75 MG PO TABS Oral Take 75 mg by mouth daily.      Marland Kitchen METFORMIN HCL 500 MG (MOD) PO TB24 Oral Take 500 mg by mouth 3 (three) times daily.      Marland Kitchen NABUMETONE 500 MG PO TABS Oral Take 500 mg by mouth 2 (two) times daily.      Marland Kitchen NITROFURANTOIN MONOHYD MACRO 100 MG PO CAPS Oral Take 100 mg by mouth daily.      Marland Kitchen PANTOPRAZOLE SODIUM 40 MG PO TBEC Oral Take 40 mg by mouth daily.      Marland Kitchen RAMIPRIL 10 MG PO TABS Oral Take 10 mg by mouth daily.      Marland Kitchen ROSUVASTATIN CALCIUM 10 MG PO TABS Oral Take 1 tablet (10 mg  total) by mouth daily. New medication 30 tablet 6  . TAMSULOSIN HCL 0.4 MG PO CAPS Oral Take 0.4 mg by mouth daily.        Physical Exam    BP 135/80  Pulse 94  Temp(Src) 98.7 F (37.1 C) (Oral)  Resp 18  Ht 5\' 10"  (1.778 m)  Wt 190 lb (86.183 kg)  BMI 27.26 kg/m2  SpO2 95%  Physical Exam  Nursing note and vitals reviewed. Constitutional: He is oriented to person, place, and time. He appears well-developed and well-nourished.  HENT:  Head: Normocephalic and atraumatic.  Eyes: Conjunctivae are normal.  Neck: Normal range of motion.  Cardiovascular: Normal rate, regular rhythm, normal heart sounds and intact distal pulses.   Pulmonary/Chest: Effort normal and breath sounds normal. He has no  wheezes. He has no rales. He exhibits no tenderness.  Abdominal: Soft. Bowel sounds are normal. There is no tenderness.  Musculoskeletal: Normal range of motion. He exhibits no edema.  Neurological: He is alert and oriented to person, place, and time.  Skin: Skin is warm and dry.  Psychiatric: He has a normal mood and affect.    ED Course  Procedures    MDM Discussed case with Dr. Diona Browner.  Patient presentation does not appear cardiac,  But would recommend serial enzymes.  Consider holding statin to see if CK declines.  Will call pcp for medical admission for tx of pneumonia and observation,  R/o MI.       Candis Musa, PA 04/02/11 1053  Discussed with [facility] Dr. Donna Bernard. Will admit pt.  Candis Musa, PA 04/02/11 1109   Date: 04/02/2011  Rate: 86  Rhythm: premature ventricular contractions (PVC)  QRS Axis: normal  Intervals: normal  ST/T Wave abnormalities: nonspecific ST/T changes  Conduction Disutrbances:none  Narrative Interpretation:   Old EKG Reviewed: No clinically significant changes    Candis Musa, PA 04/02/11 1404

## 2011-04-02 NOTE — ED Notes (Signed)
A&Ox3. Skin w/p/d. Resp even and unlabored. Denies pain. NAD noted at this time. Awaiting admission.

## 2011-04-03 LAB — GLUCOSE, CAPILLARY
Glucose-Capillary: 193 mg/dL — ABNORMAL HIGH (ref 70–99)
Glucose-Capillary: 188 mg/dL — ABNORMAL HIGH (ref 70–99)
Glucose-Capillary: 218 mg/dL — ABNORMAL HIGH (ref 70–99)

## 2011-04-03 LAB — COMPREHENSIVE METABOLIC PANEL
ALT: 21 U/L (ref 0–53)
Albumin: 3.3 g/dL — ABNORMAL LOW (ref 3.5–5.2)
BUN: 10 mg/dL (ref 6–23)
Calcium: 9 mg/dL (ref 8.4–10.5)
GFR calc Af Amer: >60 mL/min (ref >60)
Glucose, Bld: 169 mg/dL — ABNORMAL HIGH (ref 70–99)
Potassium: 4 mEq/L (ref 3.5–5.1)
Sodium: 138 mEq/L (ref 135–145)
Total Protein: 5.8 g/dL — ABNORMAL LOW (ref 6.0–8.3)

## 2011-04-03 LAB — CBC
Hemoglobin: 12.7 g/dL — ABNORMAL LOW (ref 13.0–17.0)
MCH: 31.4 pg (ref 26.0–34.0)
MCHC: 33.8 g/dL (ref 30.0–36.0)
RDW: 12.5 % (ref 11.5–15.5)

## 2011-04-03 LAB — CARDIAC PANEL(CRET KIN+CKTOT+MB+TROPI)
CK, MB: 9.2 ng/mL (ref 0.3–4.0)
Relative Index: 2.8 — ABNORMAL HIGH (ref 0.0–2.5)
Relative Index: 3.2 — ABNORMAL HIGH (ref 0.0–2.5)
Total CK: 329 U/L — ABNORMAL HIGH (ref 7–232)
Total CK: 286 U/L — ABNORMAL HIGH (ref 7–232)
Troponin I: <0.3 ng/mL (ref <0.30)

## 2011-04-03 NOTE — Progress Notes (Signed)
UR Chart Review Completed  

## 2011-04-03 NOTE — Plan of Care (Signed)
Problem: Consults Goal: Diabetes Guidelines if Diabetic/Glucose > 140 If diabetic or lab glucose is > 140 mg/dl - Initiate Diabetes/Hyperglycemia Guidelines & Document Interventions  Outcome: Progressing Path reviewed and patient assessed.  Recommendations made.     

## 2011-04-03 NOTE — Progress Notes (Signed)
CRITICAL VALUE ALERT  Critical value received:  ckmb 9.2  Date of notification:  04-03-11  Time of notification:  0020  Critical value read back:yes  Nurse who received alert:  Haydee Salter  MD notified (1st page): Dr Orvan Falconer  Time of first page:  0025  MD notified (2nd page):  Time of second page:  Responding MD: DR Orvan Falconer  Time MD RESPONDED 0025

## 2011-04-03 NOTE — Progress Notes (Signed)
Inpatient Diabetes Program Recommendations  AACE/ADA: New Consensus Statement on Inpatient Glycemic Control (2009)  Target Ranges:  Prepandial:   less than 140 mg/dL      Peak postprandial:   less than 180 mg/dL (1-2 hours)      Critically ill patients:  140 - 180 mg/dL   Reason for Visit: Elevated fasting glucose 193 mg/dL  Inpatient Diabetes Program Recommendations Insulin - Basal: Increase Lantus to 12 units qhs

## 2011-04-03 NOTE — Progress Notes (Signed)
Subjective: He states he feels better today,  denies chest pain, tolerating by mouth well. He also denies a cough and no shortness of breath. Objective: Vital signs in last 24 hours: Filed Vitals:   04/02/11 2147 04/03/11 0609 04/03/11 0811 04/03/11 0930  BP: 107/62   135/73  Pulse: 63 64 68   Temp: 98.1 F (36.7 C) 97.9 F (36.6 C)    TempSrc:      Resp: 18 18 20    Height:      Weight:      SpO2: 96%  95%    Weight change:   Intake/Output Summary (Last 24 hours) at 04/03/11 1424 Last data filed at 04/03/11 0800  Gross per 24 hour  Intake    360 ml  Output      0 ml  Net    360 ml    General Appearance:    Alert, cooperative, no distress, appears stated age  Lungs:     Clear to auscultation bilaterally, respirations unlabored   Heart:    Regular rate and rhythm, S1 and S2 normal, no murmur, rub   or gallop  Abdomen:     Soft, non-tender, bowel sounds active all four quadrants,    no masses, no organomegaly  Extremities:    no cyanosis or edema  Neurologic:   CNII-XII intact, normal strength, sensation and reflexes    throughout     Lab Results:  Results for orders placed during the hospital encounter of 04/02/11 (from the past 24 hour(s))  GLUCOSE, CAPILLARY     Status: Abnormal   Collection Time   04/02/11  4:51 PM      Component Value Range   Glucose-Capillary 204 (*) 70 - 99 (mg/dL)   Comment 1 Documented in Chart     Comment 2 Notify RN    CARDIAC PANEL(CRET KIN+CKTOT+MB+TROPI)     Status: Abnormal   Collection Time   04/02/11  5:09 PM      Component Value Range   Total CK 407 (*) 7 - 232 (U/L)   CK, MB 10.5 (*) 0.3 - 4.0 (ng/mL)   Troponin I <0.30  <0.30 (ng/mL)   Relative Index 2.6 (*) 0.0 - 2.5   GLUCOSE, CAPILLARY     Status: Abnormal   Collection Time   04/02/11  9:24 PM      Component Value Range   Glucose-Capillary 155 (*) 70 - 99 (mg/dL)  CARDIAC PANEL(CRET KIN+CKTOT+MB+TROPI)     Status: Abnormal   Collection Time   04/02/11 11:03 PM   Component Value Range   Total CK 329 (*) 7 - 232 (U/L)   CK, MB 9.2 (*) 0.3 - 4.0 (ng/mL)   Troponin I <0.30  <0.30 (ng/mL)   Relative Index 2.8 (*) 0.0 - 2.5   CBC     Status: Abnormal   Collection Time   04/03/11  4:35 AM      Component Value Range   WBC 7.1  4.0 - 10.5 (K/uL)   RBC 4.04 (*) 4.22 - 5.81 (MIL/uL)   Hemoglobin 12.7 (*) 13.0 - 17.0 (g/dL)   HCT 16.1 (*) 09.6 - 52.0 (%)   MCV 93.1  78.0 - 100.0 (fL)   MCH 31.4  26.0 - 34.0 (pg)   MCHC 33.8  30.0 - 36.0 (g/dL)   RDW 04.5  40.9 - 81.1 (%)   Platelets 117 (*) 150 - 400 (K/uL)  COMPREHENSIVE METABOLIC PANEL     Status: Abnormal   Collection Time  04/03/11  4:35 AM      Component Value Range   Sodium 138  135 - 145 (mEq/L)   Potassium 4.0  3.5 - 5.1 (mEq/L)   Chloride 103  96 - 112 (mEq/L)   CO2 29  19 - 32 (mEq/L)   Glucose, Bld 169 (*) 70 - 99 (mg/dL)   BUN 10  6 - 23 (mg/dL)   Creatinine, Ser 1.61  0.50 - 1.35 (mg/dL)   Calcium 9.0  8.4 - 09.6 (mg/dL)   Total Protein 5.8 (*) 6.0 - 8.3 (g/dL)   Albumin 3.3 (*) 3.5 - 5.2 (g/dL)   AST 16  0 - 37 (U/L)   ALT 21  0 - 53 (U/L)   Alkaline Phosphatase 66  39 - 117 (U/L)   Total Bilirubin 0.3  0.3 - 1.2 (mg/dL)   GFR calc non Af Amer >60  >60 (mL/min)   GFR calc Af Amer >60  >60 (mL/min)  CARDIAC PANEL(CRET KIN+CKTOT+MB+TROPI)     Status: Abnormal   Collection Time   04/03/11  4:36 AM      Component Value Range   Total CK 286 (*) 7 - 232 (U/L)   CK, MB 9.1 (*) 0.3 - 4.0 (ng/mL)   Troponin I <0.30  <0.30 (ng/mL)   Relative Index 3.2 (*) 0.0 - 2.5   GLUCOSE, CAPILLARY     Status: Abnormal   Collection Time   04/03/11  7:37 AM      Component Value Range   Glucose-Capillary 193 (*) 70 - 99 (mg/dL)   Comment 1 Documented in Chart     Comment 2 Notify RN    GLUCOSE, CAPILLARY     Status: Abnormal   Collection Time   04/03/11 11:34 AM      Component Value Range   Glucose-Capillary 237 (*) 70 - 99 (mg/dL)   Comment 1 Documented in Chart     Comment 2 Notify RN       Studies/Results:  Scheduled Meds:   . aspirin  81 mg Oral Daily  . azithromycin  500 mg Intravenous Once  . carvedilol  6.25 mg Oral BID WC  . cefTRIAXone (ROCEPHIN) IVPB 1 gram/50 mL D5W  1 g Intravenous Once  . clopidogrel  75 mg Oral Daily  . enoxaparin  40 mg Subcutaneous Q24H  . insulin aspart      . insulin aspart  0-15 Units Subcutaneous TID WC  . insulin aspart  0-5 Units Subcutaneous QHS  . insulin glargine  8 Units Subcutaneous QHS  . nitrofurantoin (macrocrystal-monohydrate)  100 mg Oral QHS  . pantoprazole  40 mg Oral Daily  . ramipril  10 mg Oral Daily  . Tamsulosin HCl  0.4 mg Oral QHS   Continuous Infusions:   . sodium chloride 1,000 mL (04/03/11 1139)  . DISCONTD: sodium chloride 125 mL/hr at 04/02/11 0949   PRN Meds:.acetaminophen, acetaminophen, bisacodyl, HYDROcodone-acetaminophen, nitroGLYCERIN, ondansetron (ZOFRAN) IV, ondansetron Assessment/Plan: Principal Problem:  *Community acquired pneumonia - continue current antibiotics and mobilize. Improving, leukocytosis resolved. Consult PT OT Active Problems:  DM- on  lantus and  sliding scale for now.  HYPERLIPIDEMIA, MIXED  GERD- on  PPI CAD/ CORONARY ARTERY BYPASS GRAFT, THREE VESSEL -He remains chest pain-free, continue outpatient medications.  Elevated CPK- ER secondary to rhabdomyolysis due to the Crestor,  With  Hydration,  his CPK has  Improved to  286 and troponins are remaining negative.   LOS: 1 day   Amanuel Sinkfield C 04/03/2011, 2:24 PM

## 2011-04-04 LAB — GLUCOSE, CAPILLARY: Glucose-Capillary: 200 mg/dL — ABNORMAL HIGH (ref 70–99)

## 2011-04-04 LAB — TSH: TSH: 0.817 u[IU]/mL (ref 0.350–4.500)

## 2011-04-04 MED ORDER — CEFUROXIME AXETIL 500 MG PO TABS: 500.0000 mg | ORAL_TABLET | Freq: Two times a day (BID) | ORAL | Status: AC

## 2011-04-04 MED ORDER — AZITHROMYCIN 250 MG PO TABS: 250.0000 mg | ORAL_TABLET | Freq: Every day | ORAL | Status: DC

## 2011-04-04 NOTE — Discharge Summary (Signed)
Physician Discharge Summary  Patient ID: Jeffrey [name] MRN: [id number] DOB/AGE: [birth year] 77 y.o.  Admit date: 04/02/2011 Discharge date: 04/04/2011  Primary Care Physician:  [name], MD   Discharge Diagnoses:   Community acquired pneumonia CPK/rhabdomyolysis- resolved Gastroesophageal reflux disease Diabetes mellitus Hyper lipidemia Coronary artery disease- CABG in the past, stable.   Present on Admission:  .Community acquired pneumonia .DM .GERD .HYPERLIPIDEMIA, MIXED .Elevated CPK  Current Discharge Medication List    START taking these medications   Details  azithromycin (ZITHROMAX) 250 MG tablet Take 1 tablet (250 mg total) by mouth daily. For Qty: 6 each, Refills: 0    cefUROXime (CEFTIN) 500 MG tablet Take 1 tablet (500 mg total) by mouth 2 (two) times daily. Qty: 14 tablet, Refills: 0      CONTINUE these medications which have NOT CHANGED   Details  carvedilol (COREG) 6.25 MG tablet Take 6.25 mg by mouth 2 (two) times daily with a meal.     clopidogrel (PLAVIX) 75 MG tablet Take 75 mg by mouth daily.     metFORMIN (GLUCOPHAGE) 500 MG tablet Take 500 mg by mouth 2 (two) times daily as needed. Sugar high     nabumetone (RELAFEN) 500 MG tablet Take 500 mg by mouth 2 (two) times daily.     nitrofurantoin, macrocrystal-monohydrate, (MACROBID) 100 MG capsule Take 100 mg by mouth at bedtime.     pantoprazole (PROTONIX) 40 MG tablet Take 40 mg by mouth daily.      ramipril (ALTACE) 10 MG tablet Take 10 mg by mouth daily.     rosuvastatin (CRESTOR) 10 MG tablet Take 1 tablet (10 mg total) by mouth daily. New medication Qty: 30 tablet, Refills: 6    Tamsulosin HCl (FLOMAX) 0.4 MG CAPS Take 0.4 mg by mouth at bedtime.     aspirin 81 MG tablet Take 81 mg by mouth daily.     metFORMIN (GLUMETZA) 500 MG (MOD) 24 hr tablet Take 500 mg by mouth 3 (three) times daily.     nitroGLYCERIN (NITROSTAT) 0.4 MG SL tablet Place 0.4 mg under the  tongue every 5 (five) minutes as needed. Chest pain/patient said he has never had to use medication as he takes Rolaids if he gets chest pain and that stops pain          Disposition and Follow-up:He is been discharged home in stable condition and to follow up outpatient with his primary care physician and cardiologist.  Consults: none   Significant Diagnostic Studies:  Dg Abd Acute W/chest  04/02/2011  *RADIOLOGY REPORT*  Clinical Data: Bloating, constipation, generalized weakness  ACUTE ABDOMEN SERIES (ABDOMEN 2 VIEW & CHEST 1 VIEW)  Comparison: Chest radiograph 08/30/2010, abdominal radiographs 05/14/2009  Findings: Normal heart size post CABG. Calcified tortuous aorta. Pulmonary vascularity normal. Minimal bronchitic changes. Infiltrate identified at medial right lung base. Remaining lungs clear. No pleural effusion or pneumothorax. Scattered vascular calcifications. Normal bowel gas pattern. No bowel dilatation, bowel wall thickening, or free intraperitoneal air. Bones demineralized with levoconvex lumbar scoliosis. Questionable 6 mm right renal calculus.  IMPRESSION: Right lower lobe infiltrate question pneumonia. Question 6 mm nonobstructing right renal calculus. No acute abdominal findings.  Original Report Authenticated By: Lollie Marrow, M.D.    Brief H and P: For complete details please refer to admission H and P, but in brief the patient is a pleasant 77 year old white male with the above listed medical problems lives alone and presented with a generalizedweakness and malaise for  a few days. He stated and it got worse and on the day of admission he just felt so bad he was unable to eat breakfast. He denied cough, fevers, chest pain, shortness of breath, dysuria, melena, and hematochezia. He was seen in the ER and a chest x-ray was done and and showed right lower lobe infiltrate question pneumonia. Cardiac enzymes were also done and the CPKs was elevated with normal troponins at. He is  admitted for further evaluation and management.      Hospital Course:  Principal Problem:  *Community acquired pneumonia - As discussed above upon admission a chest x-ray was done and  he was started on empiric antibiotics with Rocephin and zithromax. He remained afebrile and in the hospital and his leukocytosis resolved- white cell count today is 7.1 from 14.3 on admission. He has been tolerating POs also well. He  evaluated by PT and outpatient PT recommended but the patient declined it. He is clinically  improved and will be discharged home at this time foroutpatient followup with his primary care physician.  Active Problems:  DM- He wascovered with insulin during this hospital stay. He is to continue his outpatient medications upon discharge.  HYPERLIPIDEMIA, MIXED-   GERD- He was maintained on a PPI during this hospital stay.  CORONARY ARTERY BYPASS GRAFT, THREE VESSEL, HX OF- he says that I could during this hospital stay an extra points were negative. He remained chest pain-free. He was maintained on his outpatient medications which he is to continue upon discharge and follow up with his cardiologist outpatient.  Elevated CPK/Rhabdomyolysis-CPKs were elevated and the 600 range upon admission and his Crestor was held and he was hydrated with IV fluids and his CPK was down to about 286 on recheck. The patient's troponins were within normal limits and he remained chest pain-free .   Time spent on Discharge:  Signed: Kela Millin 04/04/2011, 2:43 PM

## 2011-04-04 NOTE — Plan of Care (Signed)
Problem: Phase III Progression Outcomes Goal: Activity at appropriate level-compared to baseline (UP IN CHAIR FOR HEMODIALYSIS)  Outcome: Progressing See PT evaluation and progress notes for details.     Problem: Discharge Progression Outcomes Goal: Activity appropriate for discharge plan Outcome: Progressing See PT evaluation and progress notes for details.

## 2011-04-04 NOTE — Progress Notes (Signed)
Physical Therapy Evaluation Patient Name: Jeffrey [name] Date: 04/04/2011 Time: 09:51-10:18 am Charges: 1 EV Problem List:  Patient Active Problem List  Diagnoses  . DM  . HYPERLIPIDEMIA, MIXED  . URI  . GERD  . CORONARY ARTERY BYPASS GRAFT, THREE VESSEL, HX OF  . Community acquired pneumonia  . Elevated CPK   Past Medical History:  Past Medical History  Diagnosis Date  . Coronary artery disease   . Diabetes mellitus   . Hypertension   . PUD (peptic ulcer disease)   . Hypercholesteremia   . Myocardial infarct, old    Past Surgical History:  Past Surgical History  Procedure Date  . Tonsillectomy   . Post aortocoronary bypass surgery   . Coronary artery bypass graft     Precautions/Restrictions  Restrictions Weight Bearing Restrictions: No Prior Functioning  Home Living Type of Home: Mobile home Lives With: Alone Receives Help From: Family Home Layout: One level Home Access: Stairs to enter Entrance Stairs-Rails: Left Entrance Stairs-Number of Steps: 4 Bathroom Shower/Tub: Forensic scientist: Standard Home Adaptive Equipment: None Prior Function Level of Independence: Independent with basic ADLs;Independent with homemaking with ambulation Cognition Cognition Arousal/Alertness: Awake/alert Overall Cognitive Status: Appears within functional limits for tasks assessed Cognition - Other Comments: not specifically tested Sensation/Coordination Sensation Light Touch: Appears Intact Extremity Assessment RUE Assessment RUE Assessment: Within Functional Limits LUE Assessment LUE Assessment: Within Functional Limits RLE Assessment RLE Assessment: Within Functional Limits LLE Assessment LLE Assessment: Within Functional Limits Mobility (including Balance) Bed Mobility Bed Mobility: Yes Supine to Sit: 4: Min assist Sitting - Scoot to Edge of Bed: 6: Modified independent (Device/Increase time) Transfers Transfers: Yes Sit to  Stand: 5: Supervision Stand to Sit: 5: Supervision Ambulation/Gait Ambulation/Gait: Yes Ambulation/Gait Assistance: 4: Min assist Assistive device: None Gait Pattern:  (staggering)  Balance Balance Assessed: Yes Dynamic Standing Balance Dynamic Standing - Level of Assistance: 5: Stand by assistance Exercise     End of Session PT - End of Session Equipment Utilized During Treatment: Gait belt Activity Tolerance: Patient limited by fatigue Patient left: in chair Nurse Communication: Other (comment) (that PT recommends OPPT f/u for balance training.  ) General Behavior During Session: [facility] for tasks performed Cognition: [facility] for tasks performed PT Assessment/Plan/Recommendation PT Assessment PT Recommendation/Assessment: Patient will need skilled PT in the acute care venue PT Problem List: Decreased activity tolerance;Decreased balance;Decreased mobility;Decreased coordination;Decreased knowledge of use of DME PT Therapy Diagnosis : Difficulty walking;Abnormality of gait PT Plan PT Frequency: Min 3X/week PT Treatment/Interventions: DME instruction;Gait training;Stair training;Functional mobility training;Therapeutic exercise;Balance training;Neuromuscular re-education;Patient/family education PT Recommendation Follow Up Recommendations: Outpatient PT (for balance therapy) Equipment Recommended: None recommended by PT PT Goals  Acute Rehab PT Goals PT Goal Formulation: With patient/family Time For Goal Achievement: 2 weeks Pt will go Supine/Side to Sit: Independently Pt will go Sit to Supine/Side: Independently Pt will Transfer Sit to Stand/Stand to Sit: Independently Pt will Ambulate: >150 feet;Independently;Other (comment) (with no LOB or assistive device need.  ) Pt will Go Up / Down Stairs: 3-5 stairs;with modified independence;with rail(s) Additional Goals Additional Goal #1: Patient will complete Berg Balance Assessment and score greater than a 52/56 indicating a low fall  risk.   Zykeem Bauserman, Claretta Fraise 04/04/2011, 2:22 PM

## 2011-04-04 NOTE — Plan of Care (Signed)
Problem: Discharge Progression Outcomes Goal: Vaccine documented on D/C instructions Outcome: Completed/Met Date Met:  04/04/11 Pt has been previously vac. For pneumonia pt is ambulating in halls without SOB IV removed Discharge instructions read to pt Care note for carb modified diet given with explanation  Pt discharged to home with family

## 2011-04-16 ENCOUNTER — Encounter: Payer: Self-pay | Admitting: Cardiovascular Disease

## 2011-04-16 NOTE — ED Provider Notes (Signed)
Medical screening examination/treatment/procedure(s) were performed by non-physician practitioner and as supervising physician I was immediately available for consultation/collaboration.  The previous note that says I "shared" the pt from the 04/02/11 visit is incorrect.  I did NOT actually see this pt.   Nicholes Stairs, MD 04/16/11 1313

## 2011-05-07 ENCOUNTER — Other Ambulatory Visit: Payer: Self-pay

## 2011-05-07 ENCOUNTER — Inpatient Hospital Stay (HOSPITAL_COMMUNITY)
Admission: EM | Admit: 2011-05-07 | Discharge: 2011-05-09 | Disposition: A | Discharge status: Other Acute Inpatient Hospital | Payer: Medicare Other | Source: Home / Self Care | Attending: Internal Medicine | Admitting: Internal Medicine

## 2011-05-07 ENCOUNTER — Encounter (HOSPITAL_COMMUNITY): Payer: Self-pay | Admitting: Emergency Medicine

## 2011-05-07 ENCOUNTER — Emergency Department (HOSPITAL_COMMUNITY): Payer: Medicare Other

## 2011-05-07 DIAGNOSIS — K219 Gastro-esophageal reflux disease without esophagitis: Secondary | ICD-10-CM | POA: Diagnosis present

## 2011-05-07 DIAGNOSIS — I214 Non-ST elevation (NSTEMI) myocardial infarction: Secondary | ICD-10-CM

## 2011-05-07 DIAGNOSIS — E782 Mixed hyperlipidemia: Secondary | ICD-10-CM | POA: Diagnosis present

## 2011-05-07 DIAGNOSIS — N400 Enlarged prostate without lower urinary tract symptoms: Secondary | ICD-10-CM | POA: Diagnosis present

## 2011-05-07 DIAGNOSIS — F17200 Nicotine dependence, unspecified, uncomplicated: Secondary | ICD-10-CM | POA: Diagnosis present

## 2011-05-07 DIAGNOSIS — R079 Chest pain, unspecified: Secondary | ICD-10-CM | POA: Diagnosis present

## 2011-05-07 DIAGNOSIS — I2510 Atherosclerotic heart disease of native coronary artery without angina pectoris: Secondary | ICD-10-CM | POA: Diagnosis present

## 2011-05-07 DIAGNOSIS — Z7902 Long term (current) use of antithrombotics/antiplatelets: Secondary | ICD-10-CM

## 2011-05-07 DIAGNOSIS — I2582 Chronic total occlusion of coronary artery: Secondary | ICD-10-CM | POA: Diagnosis present

## 2011-05-07 DIAGNOSIS — I10 Essential (primary) hypertension: Secondary | ICD-10-CM | POA: Diagnosis present

## 2011-05-07 DIAGNOSIS — Z951 Presence of aortocoronary bypass graft: Secondary | ICD-10-CM

## 2011-05-07 DIAGNOSIS — I252 Old myocardial infarction: Secondary | ICD-10-CM

## 2011-05-07 DIAGNOSIS — E119 Type 2 diabetes mellitus without complications: Secondary | ICD-10-CM | POA: Diagnosis present

## 2011-05-07 DIAGNOSIS — Z7982 Long term (current) use of aspirin: Secondary | ICD-10-CM

## 2011-05-07 DIAGNOSIS — Z79899 Other long term (current) drug therapy: Secondary | ICD-10-CM

## 2011-05-07 DIAGNOSIS — R748 Abnormal levels of other serum enzymes: Secondary | ICD-10-CM | POA: Diagnosis present

## 2011-05-07 DIAGNOSIS — K279 Peptic ulcer, site unspecified, unspecified as acute or chronic, without hemorrhage or perforation: Secondary | ICD-10-CM | POA: Diagnosis present

## 2011-05-07 DIAGNOSIS — Z9861 Coronary angioplasty status: Secondary | ICD-10-CM

## 2011-05-07 DIAGNOSIS — L738 Other specified follicular disorders: Secondary | ICD-10-CM | POA: Diagnosis present

## 2011-05-07 DIAGNOSIS — I25810 Atherosclerosis of coronary artery bypass graft(s) without angina pectoris: Secondary | ICD-10-CM | POA: Diagnosis present

## 2011-05-07 HISTORY — DX: Gastro-esophageal reflux disease without esophagitis: K21.9

## 2011-05-07 LAB — DIFFERENTIAL
Basophils Relative: 0 % (ref 0–1)
Eosinophils Absolute: 0.2 10*3/uL (ref 0.0–0.7)
Eosinophils Relative: 4 % (ref 0–5)
Lymphs Abs: 1.1 10*3/uL (ref 0.7–4.0)
Monocytes Absolute: 0.5 10*3/uL (ref 0.1–1.0)
Monocytes Relative: 9 % (ref 3–12)

## 2011-05-07 LAB — BASIC METABOLIC PANEL
BUN: 10 mg/dL (ref 6–23)
Chloride: 101 mEq/L (ref 96–112)
GFR calc Af Amer: >90 mL/min (ref >90)
Glucose, Bld: 200 mg/dL — ABNORMAL HIGH (ref 70–99)
Potassium: 4.2 mEq/L (ref 3.5–5.1)
Sodium: 134 mEq/L — ABNORMAL LOW (ref 135–145)

## 2011-05-07 LAB — GLUCOSE, CAPILLARY: Glucose-Capillary: 177 mg/dL — ABNORMAL HIGH (ref 70–99)

## 2011-05-07 LAB — CBC
HCT: 37.9 % — ABNORMAL LOW (ref 39.0–52.0)
Hemoglobin: 12.9 g/dL — ABNORMAL LOW (ref 13.0–17.0)
MCH: 31.2 pg (ref 26.0–34.0)
MCHC: 34 g/dL (ref 30.0–36.0)
MCV: 91.8 fL (ref 78.0–100.0)

## 2011-05-07 LAB — POCT I-STAT TROPONIN I

## 2011-05-07 MED ORDER — POLYETHYLENE GLYCOL 3350 17 G PO PACK
17.0000 g | PACK | Freq: Every day | ORAL | Status: DC
  Administered 2011-05-07 – 2011-05-08 (×2): 17 g via ORAL
  Filled 2011-05-07 (×2): qty 1

## 2011-05-07 MED ORDER — ONDANSETRON HCL 4 MG PO TABS: 4.0000 mg | ORAL_TABLET | Freq: Four times a day (QID) | ORAL | Status: DC | PRN

## 2011-05-07 MED ORDER — HYDROCODONE-ACETAMINOPHEN 5-325 MG PO TABS: 1.0000 | ORAL_TABLET | ORAL | Status: DC | PRN

## 2011-05-07 MED ORDER — NITROGLYCERIN 2 % TD OINT
1.0000 [in_us] | TOPICAL_OINTMENT | Freq: Once | TRANSDERMAL | Status: AC
  Administered 2011-05-07: 1 [in_us] via TOPICAL
  Filled 2011-05-07: qty 1

## 2011-05-07 MED ORDER — INSULIN ASPART 100 UNIT/ML ~~LOC~~ SOLN
SUBCUTANEOUS | Status: AC
  Filled 2011-05-07: qty 3

## 2011-05-07 MED ORDER — ASPIRIN EC 325 MG PO TBEC
325.0000 mg | DELAYED_RELEASE_TABLET | Freq: Every day | ORAL | Status: DC
  Administered 2011-05-07 – 2011-05-08 (×2): 325 mg via ORAL
  Filled 2011-05-07 (×2): qty 1

## 2011-05-07 MED ORDER — ZOLPIDEM TARTRATE 5 MG PO TABS: 5.0000 mg | ORAL_TABLET | Freq: Every evening | ORAL | Status: DC | PRN

## 2011-05-07 MED ORDER — ENOXAPARIN SODIUM 40 MG/0.4ML ~~LOC~~ SOLN
40.0000 mg | SUBCUTANEOUS | Status: DC
  Administered 2011-05-07: 40 mg via SUBCUTANEOUS
  Filled 2011-05-07: qty 0.4

## 2011-05-07 MED ORDER — SODIUM CHLORIDE 0.9 % IJ SOLN
3.0000 mL | Freq: Two times a day (BID) | INTRAMUSCULAR | Status: DC
  Administered 2011-05-07 – 2011-05-08 (×2): 3 mL via INTRAVENOUS
  Filled 2011-05-07 (×2): qty 3

## 2011-05-07 MED ORDER — INFLUENZA VIRUS VACC SPLIT PF IM SUSP
0.5000 mL | Freq: Once | INTRAMUSCULAR | Status: AC
  Administered 2011-05-08: 0.5 mL via INTRAMUSCULAR
  Filled 2011-05-07: qty 0.5

## 2011-05-07 MED ORDER — GUAIFENESIN-DM 100-10 MG/5ML PO SYRP: 5.0000 mL | ORAL_SOLUTION | ORAL | Status: DC | PRN

## 2011-05-07 MED ORDER — ALBUTEROL SULFATE (5 MG/ML) 0.5% IN NEBU: 2.5000 mg | INHALATION_SOLUTION | RESPIRATORY_TRACT | Status: DC | PRN

## 2011-05-07 MED ORDER — NITROGLYCERIN 0.4 MG SL SUBL
0.4000 mg | SUBLINGUAL_TABLET | SUBLINGUAL | Status: DC | PRN
Start: 2011-05-07 — End: 2011-05-09

## 2011-05-07 MED ORDER — INSULIN ASPART 100 UNIT/ML ~~LOC~~ SOLN
0.0000 [IU] | Freq: Three times a day (TID) | SUBCUTANEOUS | Status: DC
  Administered 2011-05-07 (×2): 1 [IU] via SUBCUTANEOUS
  Administered 2011-05-08 (×2): 2 [IU] via SUBCUTANEOUS
  Administered 2011-05-08: 5 [IU] via SUBCUTANEOUS
  Filled 2011-05-07: qty 10

## 2011-05-07 MED ORDER — ACETAMINOPHEN 325 MG PO TABS: 650.0000 mg | ORAL_TABLET | Freq: Four times a day (QID) | ORAL | Status: DC | PRN

## 2011-05-07 MED ORDER — ONDANSETRON HCL 4 MG/2ML IJ SOLN: 4.0000 mg | Freq: Four times a day (QID) | INTRAMUSCULAR | Status: DC | PRN

## 2011-05-07 MED ORDER — FLEET ENEMA 7-19 GM/118ML RE ENEM: 1.0000 | ENEMA | Freq: Every day | RECTAL | Status: DC | PRN

## 2011-05-07 MED ORDER — ALUM & MAG HYDROXIDE-SIMETH 200-200-20 MG/5ML PO SUSP: 30.0000 mL | Freq: Four times a day (QID) | ORAL | Status: DC | PRN

## 2011-05-07 MED ORDER — DOCUSATE SODIUM 100 MG PO CAPS
100.0000 mg | ORAL_CAPSULE | Freq: Two times a day (BID) | ORAL | Status: DC
  Administered 2011-05-07 – 2011-05-08 (×3): 100 mg via ORAL
  Filled 2011-05-07 (×3): qty 1

## 2011-05-07 MED ORDER — RAMIPRIL 10 MG PO CAPS
10.0000 mg | ORAL_CAPSULE | Freq: Every day | ORAL | Status: DC
  Administered 2011-05-08: 10 mg via ORAL
  Filled 2011-05-07: qty 1

## 2011-05-07 MED ORDER — MORPHINE SULFATE 2 MG/ML IJ SOLN: 1.0000 mg | INTRAMUSCULAR | Status: DC | PRN

## 2011-05-07 MED ORDER — TAMSULOSIN HCL 0.4 MG PO CAPS
0.4000 mg | ORAL_CAPSULE | Freq: Every day | ORAL | Status: DC
  Administered 2011-05-07 – 2011-05-08 (×2): 0.4 mg via ORAL
  Filled 2011-05-07 (×2): qty 1

## 2011-05-07 MED ORDER — ACETAMINOPHEN 650 MG RE SUPP: 650.0000 mg | Freq: Four times a day (QID) | RECTAL | Status: DC | PRN

## 2011-05-07 MED ORDER — NITROFURANTOIN MONOHYD MACRO 100 MG PO CAPS
ORAL_CAPSULE | ORAL | Status: AC
  Filled 2011-05-07: qty 1

## 2011-05-07 MED ORDER — THERA M PLUS PO TABS
1.0000 | ORAL_TABLET | Freq: Every day | ORAL | Status: DC
  Administered 2011-05-08: 1 via ORAL
  Filled 2011-05-07: qty 1

## 2011-05-07 MED ORDER — CARVEDILOL 3.125 MG PO TABS
6.2500 mg | ORAL_TABLET | Freq: Two times a day (BID) | ORAL | Status: DC
  Administered 2011-05-08 (×2): 6.25 mg via ORAL
  Filled 2011-05-07 (×2): qty 2

## 2011-05-07 MED ORDER — NITROFURANTOIN MONOHYD MACRO 100 MG PO CAPS
100.0000 mg | ORAL_CAPSULE | Freq: Every day | ORAL | Status: DC
  Administered 2011-05-07 – 2011-05-08 (×2): 100 mg via ORAL
  Filled 2011-05-07 (×2): qty 1

## 2011-05-07 MED ORDER — CLOPIDOGREL BISULFATE 75 MG PO TABS
75.0000 mg | ORAL_TABLET | Freq: Every day | ORAL | Status: DC
  Administered 2011-05-08: 75 mg via ORAL
  Filled 2011-05-07: qty 1

## 2011-05-07 MED ORDER — SENNA 8.6 MG PO TABS: 2.0000 | ORAL_TABLET | Freq: Every day | ORAL | Status: DC | PRN

## 2011-05-07 MED ORDER — PANTOPRAZOLE SODIUM 40 MG PO TBEC
40.0000 mg | DELAYED_RELEASE_TABLET | Freq: Every day | ORAL | Status: DC
  Administered 2011-05-08: 40 mg via ORAL
  Filled 2011-05-07: qty 1

## 2011-05-07 NOTE — ED Notes (Signed)
Dr. Jerral Ralph to bedside for evaluation of patient.  Awaiting admission orders and bed request.

## 2011-05-07 NOTE — ED Notes (Signed)
CRITICAL VALUE ALERT  Critical value received:  CKMB 17.8  Date of notification:  05/07/11  Time of notification:  1108  Critical value read back:yes  Nurse who received alert:  Ky Barban, RN  MD notified (1st page):  1108  Time of first page:  1108  MD notified (2nd page):  Time of second page:  Responding MD:  Estell Harpin  Time MD responded:  1108

## 2011-05-07 NOTE — ED Provider Notes (Signed)
History    Scribed for [name], MD, the patient was seen in room APA10/AP[facility]10. This chart was scribed by Katha Cabal.   CSN: [id number] Arrival date & time: 05/07/2011 10:04 AM   First MD Initiated Contact with Patient 05/07/11 1017      Chief Complaint  Patient presents with  . Chest Pain    (Consider location/radiation/quality/duration/timing/severity/associated sxs/prior treatment) HPI Jeffrey [name] is a 77 y.o. male who presents to the Emergency Department complaining of sudden onset of mild to moderate chest pain just prior to arrival with associated epigastric abdominal pain.  Patient states pain is similar to previous episode of indigestion.  Patient took Rolaids and Pepto Bismol with out relief.  Patient took a NTG tablet and called EMS.  Patient lives alone.  Reports relief of chest pain with NTG.  Denies current chest pain and any other pain.   Patient had 3 stents placed in January 2012 and had open heart surgery 20 years ago. Patient last took NTG in January 2012.  Patient reports abdominal pain since yesterday that has resolved. Patient with hx of CAD, HTN, peptic ulcer disease, hypercholesterolemia, MI.   Cardiologist: [facility] PCP:  [facility]    Past Medical History  Diagnosis Date  . Coronary artery disease   . Diabetes mellitus   . Hypertension   . PUD (peptic ulcer disease)   . Hypercholesteremia   . Myocardial infarct, old     Past Surgical History  Procedure Date  . Tonsillectomy   . Post aortocoronary bypass surgery   . Coronary artery bypass graft     No family history on file.  History  Substance Use Topics  . Smoking status: Former Smoker    Types: Cigarettes  . Smokeless tobacco: Current User    Types: Chew  . Alcohol Use: No      Review of Systems  Constitutional: Negative for fatigue.  HENT: Negative for congestion, sinus pressure and ear discharge.   Eyes: Negative for discharge.  Respiratory: Negative  for cough.   Cardiovascular: Negative for chest pain (resolved).  Gastrointestinal: Negative for abdominal pain and diarrhea.  Genitourinary: Negative for frequency and hematuria.  Musculoskeletal: Negative for back pain.  Skin: Negative for rash.  Neurological: Negative for seizures and headaches.  Hematological: Negative.   Psychiatric/Behavioral: Negative for hallucinations.    Allergies  Review of patient's allergies indicates no known allergies.  Home Medications   Current Outpatient Rx  Name Route Sig Dispense Refill  . ASPIRIN 325 MG PO TABS Oral Take 162.5 mg by mouth daily. TAKES WITH 81 MG ASPIRIN     . ASPIRIN 81 MG PO TABS Oral Take 81 mg by mouth daily.     Marland Kitchen CARVEDILOL 6.25 MG PO TABS Oral Take 6.25 mg by mouth 2 (two) times daily with a meal.     . CLOPIDOGREL BISULFATE 75 MG PO TABS Oral Take 75 mg by mouth daily.     Marland Kitchen KRILL OIL 300 MG PO CAPS Oral Take 1 capsule by mouth daily.      Marland Kitchen METFORMIN HCL 500 MG PO TABS Oral Take 500 mg by mouth 3 (three) times daily.     Carma Leaven M PLUS PO TABS Oral Take 1 tablet by mouth daily.      Marland Kitchen NABUMETONE 500 MG PO TABS Oral Take 500 mg by mouth 2 (two) times daily.     Marland Kitchen NITROFURANTOIN MONOHYD MACRO 100 MG PO CAPS Oral Take 100 mg by  mouth at bedtime.     Marland Kitchen NITROGLYCERIN 0.4 MG SL SUBL Sublingual Place 0.4 mg under the tongue every 5 (five) minutes as needed. Chest pain/patient said he has never had to use medication as he takes Rolaids if he gets chest pain and that stops pain     . PANTOPRAZOLE SODIUM 40 MG PO TBEC Oral Take 40 mg by mouth daily.      Marland Kitchen RAMIPRIL 10 MG PO TABS Oral Take 10 mg by mouth daily.     Marland Kitchen TAMSULOSIN HCL 0.4 MG PO CAPS Oral Take 0.4 mg by mouth at bedtime.     . AZITHROMYCIN 250 MG PO TABS Oral Take 1 tablet (250 mg total) by mouth daily. For 6 each 0  . ROSUVASTATIN CALCIUM 10 MG PO TABS Oral Take 1 tablet (10 mg total) by mouth daily. New medication 30 tablet 6    BP 149/65  Pulse 71   Temp(Src) 98.2 F (36.8 C) (Oral)  Resp 15  Ht 5\' 10"  (1.778 m)  Wt 190 lb (86.183 kg)  BMI 27.26 kg/m2  SpO2 98%  Physical Exam  Constitutional: He is oriented to person, place, and time. He appears well-developed. No distress.  HENT:  Head: Normocephalic and atraumatic.  Mouth/Throat: Oropharynx is clear and moist.  Eyes: Conjunctivae and EOM are normal. No scleral icterus.  Neck: Neck supple. No thyromegaly present.  Cardiovascular: Normal rate and regular rhythm.  Exam reveals no gallop and no friction rub.   No murmur heard. Pulmonary/Chest: Effort normal. No stridor. He has no wheezes. He has no rales. He exhibits no tenderness.       Midline incision on chest from cardiac surgery   Abdominal: He exhibits no distension. There is no tenderness. There is no rebound.  Musculoskeletal: Normal range of motion. He exhibits no edema.  Lymphadenopathy:    He has no cervical adenopathy.  Neurological: He is oriented to person, place, and time. No cranial nerve deficit. Coordination normal.  Skin: No rash noted. He is not diaphoretic. No erythema.  Psychiatric: He has a normal mood and affect. His behavior is normal.    ED Course  Procedures (including critical care time)   DIAGNOSTIC STUDIES: Oxygen Saturation is 97% on nasal cannula, normal by my interpretation.    Cardiac Monitor: HR 75, NSR, interpreted by EDMD.    COORDINATION OF CARE:  10:41 AM  Physical exam complete.  Reviewed EKG.  Will order labs and cardiac markers.   1:09 PM  Recheck.  Spoke with [facility] cardiology and who wanted patient admitted here but patient will be admitted to [facility].  Plan to admit patient to [facility].  Patient agrees with plan.    Orders Placed This Encounter  Procedures  . DG Chest Portable 1 View  . CBC  . Differential  . Cardiac panel Timed (cret kin+cktot+mb+tropi)  . Basic metabolic panel  . Cardiac monitoring  . Pulse oximetry, continuous  . Oxygen therapy Liters Per Minute: 2;  Mode or (Route): Nasal cannula  . POCT i-Stat troponin I  . ED EKG  . Insert peripheral IV     MEDICATIONS GIVEN IN THE E.D. Scheduled Meds:    . nitroGLYCERIN  1 inch Topical Once   Continuous Infusions:    LABS / RADIOLOGY:  Labs Reviewed  CBC - Abnormal; Notable for the following:    RBC 4.13 (*)    Hemoglobin 12.9 (*)    HCT 37.9 (*)    Platelets 135 (*)  All other components within normal limits  CARDIAC PANEL(CRET KIN+CKTOT+MB+TROPI) - Abnormal; Notable for the following:    Total CK 623 (*)    CK, MB 17.8 (*)    Relative Index 2.9 (*)    All other components within normal limits  BASIC METABOLIC PANEL - Abnormal; Notable for the following:    Sodium 134 (*)    Glucose, Bld 200 (*)    GFR calc non Af Amer 83 (*)    All other components within normal limits  DIFFERENTIAL  POCT I-STAT TROPONIN I  I-STAT TROPONIN I   Results for orders placed during the hospital encounter of 05/07/11  CBC      Component Value Range   WBC 6.1  4.0 - 10.5 (K/uL)   RBC 4.13 (*) 4.22 - 5.81 (MIL/uL)   Hemoglobin 12.9 (*) 13.0 - 17.0 (g/dL)   HCT 40.9 (*) 81.1 - 52.0 (%)   MCV 91.8  78.0 - 100.0 (fL)   MCH 31.2  26.0 - 34.0 (pg)   MCHC 34.0  30.0 - 36.0 (g/dL)   RDW 91.4  78.2 - 95.6 (%)   Platelets 135 (*) 150 - 400 (K/uL)  DIFFERENTIAL      Component Value Range   Neutrophils Relative 70  43 - 77 (%)   Neutro Abs 4.3  1.7 - 7.7 (K/uL)   Lymphocytes Relative 18  12 - 46 (%)   Lymphs Abs 1.1  0.7 - 4.0 (K/uL)   Monocytes Relative 9  3 - 12 (%)   Monocytes Absolute 0.5  0.1 - 1.0 (K/uL)   Eosinophils Relative 4  0 - 5 (%)   Eosinophils Absolute 0.2  0.0 - 0.7 (K/uL)   Basophils Relative 0  0 - 1 (%)   Basophils Absolute 0.0  0.0 - 0.1 (K/uL)  CARDIAC PANEL(CRET KIN+CKTOT+MB+TROPI)      Component Value Range   Total CK 623 (*) 7 - 232 (U/L)   CK, MB 17.8 (*) 0.3 - 4.0 (ng/mL)   Troponin I <0.30  <0.30 (ng/mL)   Relative Index 2.9 (*) 0.0 - 2.5   BASIC METABOLIC PANEL        Component Value Range   Sodium 134 (*) 135 - 145 (mEq/L)   Potassium 4.2  3.5 - 5.1 (mEq/L)   Chloride 101  96 - 112 (mEq/L)   CO2 24  19 - 32 (mEq/L)   Glucose, Bld 200 (*) 70 - 99 (mg/dL)   BUN 10  6 - 23 (mg/dL)   Creatinine, Ser 2.13  0.50 - 1.35 (mg/dL)   Calcium 9.5  8.4 - 08.6 (mg/dL)   GFR calc non Af Amer 83 (*) >90 (mL/min)   GFR calc Af Amer >90  >90 (mL/min)  POCT I-STAT TROPONIN I      Component Value Range   Troponin i, poc 0.01  0.00 - 0.08 (ng/mL)   Comment 3             Dg Chest Portable 1 View  05/07/2011  *RADIOLOGY REPORT*  Clinical Data: Chest pain.  Hypertension.  PORTABLE CHEST - 1 VIEW  Comparison: 08/30/2010 and 08/03/2010 radiographs.  Findings: 1027 hours.  The heart size and mediastinal contours are stable status post CABG.  Linear basilar densities are unchanged. The lungs otherwise clear.  There is no pleural effusion or pneumothorax.  IMPRESSION: Stable linear atelectasis or scarring in both lung bases.  No acute cardiopulmonary process.  Original Report Authenticated By: Gerrianne Scale, M.D.  Date: 05/07/2011  Rate: 70  Rhythm: normal sinus rhythm  QRS Axis: normal  Intervals: normal  ST/T Wave abnormalities: nonspecific ST changes  Conduction Disutrbances:none  Narrative Interpretation:   Old EKG Reviewed: unchanged         MDM  MDM: chest pain possible cardiac,  Possible gerd   IMPRESSION: 1. Chest pain        The chart was scribed for me under my direct supervision.  I personally performed the history, physical, and medical decision making and all procedures in the evaluation of this patient.[name], MD 05/07/11 [phone]

## 2011-05-07 NOTE — ED Notes (Signed)
Patient placed on continuous cardiac monitoring, continuous pulse oximetry, and NBP cycling q 30 minutes.  

## 2011-05-07 NOTE — H&P (Signed)
PCP:   [name], MD   Chief Complaint:  Chest pain for the past 2 days.  HPI: [name] is an 77 year old Caucasian gentleman, with a past medical history of coronary artery disease diabetes, hypertension, dyslipidemia, who comes in to the ED with the above-noted complaints. The patient since last Monday he has been having intermittent chest discomfort.Patient describes the chest discomfort mostly as indigestion. This is not associated with any shortness of breath palpitations nausea vomiting. Patient denies any radiation of this is chest discomfort. Patient did try taking some over-the-counter antacids and and had no relief and hence he came to the emergency room for further evaluation.  Review of Systems:  The patient denies anorexia, fever, weight loss,, vision loss, decreased hearing, hoarseness, chest pain, syncope, dyspnea on exertion, peripheral edema, balance deficits, hemoptysis, abdominal pain, melena, hematochezia, severe indigestion/heartburn, hematuria, incontinence, genital sores, muscle weakness, suspicious skin lesions, transient blindness, difficulty walking, depression, unusual weight change, abnormal bleeding, enlarged lymph nodes, angioedema, and breast masses.  Past Medical History: Past Medical History  Diagnosis Date   CAD-S/P CABG AND PTCA   . Diabetes mellitus   . Hypertension   . PUD (peptic ulcer disease)   . Hypercholesteremia   . Myocardial infarct, old    Past Surgical History  Procedure Date  . Tonsillectomy   . Post aortocoronary bypass surgery   . Coronary artery bypass graft     Medications: Prior to Admission medications   Medication Sig Start Date End Date Taking? Authorizing Provider  aspirin 325 MG tablet Take 162.5 mg by mouth daily. TAKES WITH 81 MG ASPIRIN    Yes Historical Provider, MD  aspirin 81 MG tablet Take 81 mg by mouth daily.    Yes Historical Provider, MD  carvedilol (COREG) 6.25 MG tablet Take 6.25 mg by mouth 2 (two)  times daily with a meal.    Yes Historical Provider, MD  clopidogrel (PLAVIX) 75 MG tablet Take 75 mg by mouth daily.    Yes Historical Provider, MD  Boris Lown Oil 300 MG CAPS Take 1 capsule by mouth daily.     Yes Historical Provider, MD  metFORMIN (GLUCOPHAGE) 500 MG tablet Take 500 mg by mouth 3 (three) times daily.    Yes Historical Provider, MD  Multiple Vitamins-Minerals (MULTIVITAMINS THER. W/MINERALS) TABS Take 1 tablet by mouth daily.     Yes Historical Provider, MD  nabumetone (RELAFEN) 500 MG tablet Take 500 mg by mouth 2 (two) times daily.    Yes Historical Provider, MD  nitrofurantoin, macrocrystal-monohydrate, (MACROBID) 100 MG capsule Take 100 mg by mouth at bedtime.    Yes Historical Provider, MD  nitroGLYCERIN (NITROSTAT) 0.4 MG SL tablet Place 0.4 mg under the tongue every 5 (five) minutes as needed. Chest pain/patient said he has never had to use medication as he takes Rolaids if he gets chest pain and that stops pain    Yes Historical Provider, MD  pantoprazole (PROTONIX) 40 MG tablet Take 40 mg by mouth daily.     Yes Historical Provider, MD  ramipril (ALTACE) 10 MG tablet Take 10 mg by mouth daily.    Yes Historical Provider, MD  Tamsulosin HCl (FLOMAX) 0.4 MG CAPS Take 0.4 mg by mouth at bedtime.    Yes Historical Provider, MD  rosuvastatin (CRESTOR) 10 MG tablet Take 1 tablet (10 mg total) by mouth daily. New medication 03/13/11 03/12/12  Joni Reining, NP    Allergies:  No Known Allergies  Social History:  reports that he has  quit smoking. His smoking use included Cigarettes. His smokeless tobacco use includes Chew. He reports that he does not drink alcohol or use illicit drugs.  Family History: No family history on file.  Physical Exam: Filed Vitals:   05/07/11 1017 05/07/11 1111 05/07/11 1302 05/07/11 1346  BP: 135/72 142/69 149/65   Pulse:  66 71   Temp:  98.2 F (36.8 C)    TempSrc:  Oral    Resp:  16 15   Height:      Weight:      SpO2:  97% 98% 100%     General exam: Awake alert speech clear not in any distress very pleasant. HEENT: Atraumatic normocephalic pupils reactive to light and accommodation. Neck: supple, no JVD Chest: bilaterally clear to auscultation. CVS: Heart sounds regular no murmurs heard. Abdomen: Soft nontender and nondistended. Bowel sounds are present. Extremities: Bunkbeds bilaterally. No peripheral edema. Neurology: Nonfocal   Labs on Admission:   [facility] 05/07/11 1017  NA 134*  K 4.2  CL 101  CO2 24  GLUCOSE 200*  BUN 10  CREATININE 0.78  CALCIUM 9.5  MG --  PHOS --      Basename 05/07/11 1017  WBC 6.1  NEUTROABS 4.3  HGB 12.9*  HCT 37.9*  MCV 91.8  PLT 135*    Basename 05/07/11 1017  CKTOTAL 623*  CKMB 17.8*  CKMBINDEX --  TROPONINI <0.30      Radiological Exams on Admission: Dg Chest Portable 1 View  05/07/2011  *RADIOLOGY REPORT*  Clinical Data: Chest pain.  Hypertension.  PORTABLE CHEST - 1 VIEW  Comparison: 08/30/2010 and 08/03/2010 radiographs.  Findings: 1027 hours.  The heart size and mediastinal contours are stable status post CABG.  Linear basilar densities are unchanged. The lungs otherwise clear.  There is no pleural effusion or pneumothorax.  IMPRESSION: Stable linear atelectasis or scarring in both lung bases.  No acute cardiopulmonary process.  Original Report Authenticated By: Gerrianne Scale, M.D.    Assessment/Plan Present on Admission:  .Chest pain -Patient has an extensive history of coronary artery disease and hence will be admitted to the telemetry unit, cardiac enzymes will be cycled. Cardiology will be consulted. We will continue him on his aspirin Plavix, because of his elevated CPK we will hold statin for now.  Marland KitchenHTN (hypertension) -This is controlled we will continue his Coreg.   Marland KitchenBPH (benign prostatic hyperplasia) -This is stable continue his Flomax.   Marland KitchenHYPERLIPIDEMIA, MIXED -His CPK is elevated as is I will hold his statin.   Marland KitchenGERD -Continue  with PPI.  Marland KitchenElevated CPK -This seems to be a chronic issue, wonder if this is related to his statin use, will monitor CPK.   .DM -Hold metformin for now and place him on a sliding scale regimen.   Jeoffrey Massed 05/07/2011, 2:22 PM

## 2011-05-07 NOTE — ED Notes (Signed)
Patient arrives via EMS from home with c/o epigastric pain that started this morning. Patient reports pain relieved with 1 SL nitro, reports pain went from 8/10 to 3/10 on NPS. Patient with cardiac history and history of stents.

## 2011-05-07 NOTE — ED Notes (Signed)
[name], MD at the bedside for re-evaluation of the patient.

## 2011-05-07 NOTE — ED Notes (Signed)
Dr Zammit at bedside. 

## 2011-05-08 DIAGNOSIS — I214 Non-ST elevation (NSTEMI) myocardial infarction: Secondary | ICD-10-CM

## 2011-05-08 DIAGNOSIS — R079 Chest pain, unspecified: Secondary | ICD-10-CM

## 2011-05-08 LAB — GLUCOSE, CAPILLARY
Glucose-Capillary: 196 mg/dL — ABNORMAL HIGH (ref 70–99)
Glucose-Capillary: 264 mg/dL — ABNORMAL HIGH (ref 70–99)
Glucose-Capillary: 208 mg/dL — ABNORMAL HIGH (ref 70–99)

## 2011-05-08 LAB — COMPREHENSIVE METABOLIC PANEL
AST: 23 U/L (ref 0–37)
Albumin: 3.4 g/dL — ABNORMAL LOW (ref 3.5–5.2)
Alkaline Phosphatase: 58 U/L (ref 39–117)
Chloride: 99 mEq/L (ref 96–112)
Creatinine, Ser: 0.81 mg/dL (ref 0.50–1.35)
Potassium: 4 mEq/L (ref 3.5–5.1)
Total Bilirubin: 0.2 mg/dL — ABNORMAL LOW (ref 0.3–1.2)

## 2011-05-08 LAB — CARDIAC PANEL(CRET KIN+CKTOT+MB+TROPI)
CK, MB: 11.7 ng/mL (ref 0.3–4.0)
Troponin I: 1.47 ng/mL (ref <0.30)
Troponin I: 0.95 ng/mL (ref <0.30)

## 2011-05-08 LAB — CBC
MCH: 31.4 pg (ref 26.0–34.0)
MCV: 92.5 fL (ref 78.0–100.0)
Platelets: 124 10*3/uL — ABNORMAL LOW (ref 150–400)
RBC: 3.98 MIL/uL — ABNORMAL LOW (ref 4.22–5.81)

## 2011-05-08 MED ORDER — HEPARIN (PORCINE) IN NACL 100-0.45 UNIT/ML-% IJ SOLN
16.0000 [IU]/kg/h | INTRAMUSCULAR | Status: DC
  Administered 2011-05-08: 14 [IU]/kg/h via INTRAVENOUS
  Filled 2011-05-08: qty 250

## 2011-05-08 MED ORDER — HEPARIN (PORCINE) IN NACL 100-0.45 UNIT/ML-% IJ SOLN
1600.0000 [IU]/h | INTRAMUSCULAR | Status: DC
  Administered 2011-05-08 (×2): 1600 [IU]/h via INTRAVENOUS
  Filled 2011-05-08: qty 250

## 2011-05-08 MED ORDER — ROSUVASTATIN CALCIUM 20 MG PO TABS
20.0000 mg | ORAL_TABLET | Freq: Every day | ORAL | Status: DC
  Administered 2011-05-08: 20 mg via ORAL
  Filled 2011-05-08: qty 1

## 2011-05-08 MED ORDER — SODIUM CHLORIDE 0.9 % IJ SOLN
INTRAMUSCULAR | Status: AC
  Administered 2011-05-08: 10 mL
  Filled 2011-05-08: qty 10

## 2011-05-08 MED ORDER — SODIUM CHLORIDE 0.9 % IV SOLN
INTRAVENOUS | Status: DC
  Administered 2011-05-09: 06:00:00 via INTRAVENOUS

## 2011-05-08 MED ORDER — ROSUVASTATIN CALCIUM 20 MG PO TABS: 10.0000 mg | ORAL_TABLET | Freq: Every day | ORAL | Status: DC

## 2011-05-08 MED ORDER — BIOTENE DRY MOUTH MT LIQD
Freq: Two times a day (BID) | OROMUCOSAL | Status: DC
  Administered 2011-05-08: 1 via OROMUCOSAL
  Administered 2011-05-08: 08:00:00 via OROMUCOSAL

## 2011-05-08 MED ORDER — ROSUVASTATIN CALCIUM 20 MG PO TABS
40.0000 mg | ORAL_TABLET | Freq: Every day | ORAL | Status: DC
  Administered 2011-05-08: 40 mg via ORAL
  Filled 2011-05-08: qty 2

## 2011-05-08 NOTE — Progress Notes (Signed)
Subjective: Has no major complaints, he is currently chest pain-free   Objective: Vital signs in last 24 hours: Temp:  [98.1 F (36.7 C)-98.2 F (36.8 C)] 98.2 F (36.8 C) (10/25 0620) Pulse Rate:  [58-83] 58  (10/25 0620) Resp:  [12-20] 20  (10/25 0620) BP: (115-149)/(59-75) 115/59 mmHg (10/25 0620) SpO2:  [97 %-100 %] 99 % (10/25 0700) Weight:  [82.5 kg (181 lb 14.1 oz)-86.183 kg (190 lb)] 181 lb 14.1 oz (82.5 kg) (10/25 0127) Weight change:  Body mass index is 26.10 kg/(m^2).  Intake/Output from previous day: 10/24 0701 - 10/25 0700 In: 689 [P.O.:600; I.V.:89] Out: 2100 [Urine:2100]   PHYSICAL EXAM: Gen Exam: Awake and alert with clear speech.   Neck: Supple, No JVD.   Chest: B/L Clear.   CVS: S1 S2 Regular, no murmurs.  Abdomen: soft, BS +, non tender, non distended.  Extremities: no edema, warm.   Neurologic: Non Focal.   Skin: No Rash.   Wounds: N/A.    Lab Results:  Basename 05/08/11 0513 05/07/11 1017  WBC 7.3 6.1  HGB 12.5* 12.9*  HCT 36.8* 37.9*  PLT 124* 135*   CMET CMP     Component Value Date/Time   NA 136 05/08/2011 0513   K 4.0 05/08/2011 0513   CL 99 05/08/2011 0513   CO2 29 05/08/2011 0513   GLUCOSE 174* 05/08/2011 0513   BUN 9 05/08/2011 0513   CREATININE 0.81 05/08/2011 0513   CALCIUM 9.2 05/08/2011 0513   PROT 6.0 05/08/2011 0513   ALBUMIN 3.4* 05/08/2011 0513   AST 23 05/08/2011 0513   ALT 24 05/08/2011 0513   ALKPHOS 58 05/08/2011 0513   BILITOT 0.2* 05/08/2011 0513   GFRNONAA 82* 05/08/2011 0513   GFRAA >90 05/08/2011 0513     Basename 05/08/11 0513  INR 1.07    Studies/Results: Dg Chest Portable 1 View  05/07/2011  *RADIOLOGY REPORT*  Clinical Data: Chest pain.  Hypertension.  PORTABLE CHEST - 1 VIEW  Comparison: 08/30/2010 and 08/03/2010 radiographs.  Findings: 1027 hours.  The heart size and mediastinal contours are stable status post CABG.  Linear basilar densities are unchanged. The lungs otherwise clear.  There is no  pleural effusion or pneumothorax.  IMPRESSION: Stable linear atelectasis or scarring in both lung bases.  No acute cardiopulmonary process.  Original Report Authenticated By: Gerrianne Scale, M.D.    Medications:  Scheduled:   . antiseptic oral rinse   Mouth Rinse BID  . aspirin EC  325 mg Oral Daily  . carvedilol  6.25 mg Oral BID WC  . clopidogrel  75 mg Oral Daily  . docusate sodium  100 mg Oral BID  . influenza  inactive virus vaccine  0.5 mL Intramuscular Once  . insulin aspart      . insulin aspart  0-9 Units Subcutaneous TID WC  . multivitamins ther. w/minerals  1 tablet Oral Daily  . nitrofurantoin (macrocrystal-monohydrate)  100 mg Oral QHS  . nitroGLYCERIN  1 inch Topical Once  . pantoprazole  40 mg Oral Q1200  . polyethylene glycol  17 g Oral Daily  . ramipril  10 mg Oral Daily  . sodium chloride  3 mL Intravenous Q12H  . sodium chloride      . Tamsulosin HCl  0.4 mg Oral QHS  . DISCONTD: enoxaparin  40 mg Subcutaneous Q24H   Continuous:   . heparin 16 Units/kg/hr (05/08/11 1004)    Assessment/Plan: Principal Problem:  *Non-STEMI: -Patient is currently chest pain-free, he is  currently on aspirin and heparin infusion. Cardiology consultation is in progress-await further recommendations.  Active Problems:  DM -CBGs stable with SSI   HYPERLIPIDEMIA, MIXED -Given the fact that the patient has had a non-STEMI we will resume his statin. Monitor his CPK.   GERD -Continue with proton pump inhibitor. He has no further indigestion.   CORONARY ARTERY BYPASS GRAFT, THREE VESSEL, HX OF -Continue with aspirin, we will await cardiology evaluation for further recommendations.   Elevated CPK -Either CPK went started back on statin.   HTN (hypertension) -This is controlled with ramipril and Coreg.   BPH (benign prostatic hyperplasia) -This is stable continue Flomax.  Disposition: Remain inpatient   Maretta Bees, MD. 05/08/2011, 10:52 AM

## 2011-05-08 NOTE — Progress Notes (Signed)
CRITICAL VALUE ALERT  Critical value received:  ckmb 13.68 and troponin 0.939  Date of notification:  05/08/11  Time of notification:  0104  Critical value read back:yes  Nurse who received alert:  Foye Deer RN  MD notified (1st page):  Dr. Barnie Del  Time of first page:  0113  MD notified (2nd page):n/a  Time of second page:n/a  Responding MD:  Dr. Barnie Del  Time MD responded:  [phone]

## 2011-05-08 NOTE — Progress Notes (Signed)
Inpatient Diabetes Program Recommendations  AACE/ADA: New Consensus Statement on Inpatient Glycemic Control (2009)  Target Ranges:  Prepandial:   less than 140 mg/dL      Peak postprandial:   less than 180 mg/dL (1-2 hours)      Critically ill patients:  140 - 180 mg/dL   Reason for Visit:Elevated glucose:  199, 196, 264 mg/dL   Inpatient Diabetes Program Recommendations Correction (SSI): Increase to Moderate Novolog Correction TID and HS Diet: Add CHO modified medium to diet.

## 2011-05-08 NOTE — Progress Notes (Signed)
ANTICOAGULATION CONSULT NOTE - Follow Up Consult  Pharmacy Consult for Heparin Indication: chest pain/ACS  No Known Allergies  Patient Measurements: Height: 5\' 10"  (177.8 cm) Weight: 181 lb 14.1 oz (82.5 kg) IBW/kg (Calculated) : 73    Vital Signs: Temp: 97.2 F (36.2 C) (10/25 1502) Temp src: Oral (10/25 1502) BP: 167/83 mmHg (10/25 1502) Pulse Rate: 73  (10/25 1502)  Labs:  Basename 05/08/11 1521 05/08/11 0738 05/08/11 0513 05/08/11 0005 05/07/11 1017  HGB -- -- 12.5* -- 12.9*  HCT -- -- 36.8* -- 37.9*  PLT -- -- 124* -- 135*  APTT -- -- -- -- --  LABPROT -- -- 14.1 -- --  INR -- -- 1.07 -- --  HEPARINUNFRC 0.15* 0.15* -- -- --  CREATININE -- -- 0.81 -- 0.78  CKTOTAL 370* 364* -- 426* --  CKMB 11.7* 12.5* -- 13.7* --  TROPONINI 0.95* 1.47* -- 0.94* --   Estimated Creatinine Clearance: 75.1 ml/min (by C-G formula based on Cr of 0.81).   Medications:  Scheduled:    . antiseptic oral rinse   Mouth Rinse BID  . aspirin EC  325 mg Oral Daily  . carvedilol  6.25 mg Oral BID WC  . clopidogrel  75 mg Oral Daily  . docusate sodium  100 mg Oral BID  . influenza  inactive virus vaccine  0.5 mL Intramuscular Once  . insulin aspart      . insulin aspart  0-9 Units Subcutaneous TID WC  . multivitamins ther. w/minerals  1 tablet Oral Daily  . nitrofurantoin (macrocrystal-monohydrate)  100 mg Oral QHS  . pantoprazole  40 mg Oral Q1200  . polyethylene glycol  17 g Oral Daily  . ramipril  10 mg Oral Daily  . rosuvastatin  20 mg Oral Daily  . sodium chloride  3 mL Intravenous Q12H  . sodium chloride      . Tamsulosin HCl  0.4 mg Oral QHS  . DISCONTD: enoxaparin  40 mg Subcutaneous Q24H  . DISCONTD: rosuvastatin  10 mg Oral Daily    Assessment: Heparin level 0.15 Below expected level   Goal of Therapy:  Heparin level 0.3-0.7 units/ml   Plan:  Heparin was still infusing at 1300 Units/hr Increase rate to 1600 units/hr Check heparin level in 6 hours Adjust rate  accordingly Monitor CBC  Raquel Ad, Mackensie Pilson Bennett 05/08/2011,4:44 PM

## 2011-05-08 NOTE — Consult Note (Signed)
ANTICOAGULATION CONSULT NOTE - Initial Consult  Pharmacy Consult for IV Heparin Indication: chest pain/ACS  No Known Allergies  Patient Measurements: Height: 5\' 10"  (177.8 cm) Weight: 181 lb 14.1 oz (82.5 kg) IBW/kg (Calculated) : 73   Vital Signs: Temp: 98.2 F (36.8 C) (10/25 0620) Temp src: Oral (10/25 0620) BP: 115/59 mmHg (10/25 0620) Pulse Rate: 58  (10/25 0620)  Labs:  Basename 05/08/11 0738 05/08/11 0513 05/08/11 0005 05/07/11 1017  HGB -- 12.5* -- 12.9*  HCT -- 36.8* -- 37.9*  PLT -- 124* -- 135*  APTT -- -- -- --  LABPROT -- 14.1 -- --  INR -- 1.07 -- --  HEPARINUNFRC 0.15* -- -- --  CREATININE -- 0.81 -- 0.78  CKTOTAL 364* -- 426* 623*  CKMB 12.5* -- 13.7* 17.8*  TROPONINI 1.47* -- 0.94* <0.30   Estimated Creatinine Clearance: 75.1 ml/min (by C-G formula based on Cr of 0.81).  Medical History: Past Medical History  Diagnosis Date  . Coronary artery disease   . Diabetes mellitus   . Hypertension   . PUD (peptic ulcer disease)   . Hypercholesteremia   . Myocardial infarct, old   . GERD (gastroesophageal reflux disease)   . Arthritis    Medications:  Scheduled:    . antiseptic oral rinse   Mouth Rinse BID  . aspirin EC  325 mg Oral Daily  . carvedilol  6.25 mg Oral BID WC  . clopidogrel  75 mg Oral Daily  . docusate sodium  100 mg Oral BID  . influenza  inactive virus vaccine  0.5 mL Intramuscular Once  . insulin aspart      . insulin aspart  0-9 Units Subcutaneous TID WC  . multivitamins ther. w/minerals  1 tablet Oral Daily  . nitrofurantoin (macrocrystal-monohydrate)  100 mg Oral QHS  . nitroGLYCERIN  1 inch Topical Once  . pantoprazole  40 mg Oral Q1200  . polyethylene glycol  17 g Oral Daily  . ramipril  10 mg Oral Daily  . sodium chloride  3 mL Intravenous Q12H  . sodium chloride      . Tamsulosin HCl  0.4 mg Oral QHS  . DISCONTD: enoxaparin  40 mg Subcutaneous Q24H   Assessment: Heparin level subtherapeutic Heparin started last  night  Goal of Therapy:  Heparin level 0.3-0.7 units/ml   Plan: Increase Heparin Re-check heparin level in 6 hours and adjust as needed Heparin level daily Cbc per protocol  Valrie Hart A 05/08/2011,9:50 AM

## 2011-05-08 NOTE — Consult Note (Signed)
CARDIOLOGY CONSULT NOTE  Patient ID: Jeffrey [name] MRN: [id number] DOB/AGE: [birth year] 77 y.o.  Admit date: 05/07/2011 Referring Physician: PTH Primary Physician: Sherwood Gambler Primary Cardiologist: Dietrich Pates Reason for Consultation: Chest Pain with positive CE  HPI: Jeffrey [name] is a 77 y/o patient of Dr. Dietrich Pates with known history of CAD, s/p CABG approximately 20 years ago at [facility], diabetes, hypertension, PUD, hypercholesterolemia who was admitted via ER with complaints of chest pain intermittently. He described heart burn symptoms without relief using antiacids or Pepto-Bismol.  He was last seen in our office on August of 2012 and had similar complaints but refused stress testing. He is not compliant with a diabetic diet.  Troponin, CPK and CPK-MB were found to be elevated on admission.   Last cath January 2012: Severe 3-vessel coronary artery disease. Continued patency of the left internal mammary artery to the left  anterior descending with a 50-60% lesion in the distal left  anterior descending after the graft insertion.  Saphenous vein graft to the left circumflex system has 2 previously placed stents which are widely patent.Saphenous vein graft to the right coronary artery is chronically occluded. Normal left ventricular function.    He is now on heparin infusion.  He has had no recurrence of chest pain. Vital signs are stable. EKG shows no evidence of ischemia.  Review of systems complete and found to be negative unless listed above   Past Medical History  Diagnosis Date  . Coronary artery disease   . Diabetes mellitus   . Hypertension   . PUD (peptic ulcer disease)   . Hypercholesteremia   . Myocardial infarct, old   . GERD (gastroesophageal reflux disease)   . Arthritis     History reviewed. No pertinent family history.  History   Social History  . Marital Status: Divorced    Spouse Name: N/A    Number of Children: N/A  . Years of Education: N/A    Occupational History  . retired     Visual merchandiser   Social History Main Topics  . Smoking status: Former Smoker    Types: Cigarettes  . Smokeless tobacco: Current User    Types: Chew  . Alcohol Use: No  . Drug Use: No  . Sexually Active: Not on file   Other Topics Concern  . Not on file   Social History Narrative  . No narrative on file    Past Surgical History  Procedure Date  . Tonsillectomy   . Post aortocoronary bypass surgery   . Coronary artery bypass graft      Prescriptions prior to admission  Medication Sig Dispense Refill  . aspirin 325 MG tablet Take 162.5 mg by mouth daily. TAKES WITH 81 MG ASPIRIN       . aspirin 81 MG tablet Take 81 mg by mouth daily.       . carvedilol (COREG) 6.25 MG tablet Take 6.25 mg by mouth 2 (two) times daily with a meal.       . clopidogrel (PLAVIX) 75 MG tablet Take 75 mg by mouth daily.       Boris Lown Oil 300 MG CAPS Take 1 capsule by mouth daily.        . metFORMIN (GLUCOPHAGE) 500 MG tablet Take 500 mg by mouth 3 (three) times daily.       . Multiple Vitamins-Minerals (MULTIVITAMINS THER. W/MINERALS) TABS Take 1 tablet by mouth daily.        . nabumetone (RELAFEN) 500 MG tablet Take  500 mg by mouth 2 (two) times daily.       . nitrofurantoin, macrocrystal-monohydrate, (MACROBID) 100 MG capsule Take 100 mg by mouth at bedtime.       . nitroGLYCERIN (NITROSTAT) 0.4 MG SL tablet Place 0.4 mg under the tongue every 5 (five) minutes as needed. Chest pain/patient said he has never had to use medication as he takes Rolaids if he gets chest pain and that stops pain       . pantoprazole (PROTONIX) 40 MG tablet Take 40 mg by mouth daily.        . ramipril (ALTACE) 10 MG tablet Take 10 mg by mouth daily.       . Tamsulosin HCl (FLOMAX) 0.4 MG CAPS Take 0.4 mg by mouth at bedtime.       . rosuvastatin (CRESTOR) 10 MG tablet Take 1 tablet (10 mg total) by mouth daily. New medication  30 tablet  6    Physical Exam: Blood pressure 115/59, pulse  58, temperature 98.2 F (36.8 C), temperature source Oral, resp. rate 20, height 5\' 10"  (1.778 m), weight 82.5 kg (181 lb 14.1 oz), SpO2 99.00%.   General: Well developed, well nourished, in no acute distress Head: Eyes PERRLA, No xanthomas.   Normal cephalic and atramatic; decreased hearing acuity  Lungs: Clear lung fields.  Cardiovascular:  No carotid bruit. No JVD.  No abdominal bruits. Positive right femoral bruit.  Distant first and second heart sounds; no murmur nor gallop appreciated Abdomen: Bowel sounds are positive, abdomen soft and non-tender without masses noted. Msk:  Back- normal, normal gait. Normal strength and tone. Extremities: No clubbing, cyanosis or edema.  DP +1 Neuro: Alert and oriented X 3. Psych:  Good affect, responds appropriately  Labs:   Lab Results  Component Value Date   WBC 7.3 05/08/2011   HGB 12.5* 05/08/2011   HCT 36.8* 05/08/2011   MCV 92.5 05/08/2011   PLT 124* 05/08/2011    Lab 05/08/11 0513  NA 136  K 4.0  CL 99  CO2 29  BUN 9  CREATININE 0.81  CALCIUM 9.2  PROT 6.0  BILITOT 0.2*  ALKPHOS 58  ALT 24  AST 23  GLUCOSE 174*   Lab Results  Component Value Date   CKTOTAL 364* 05/08/2011   CKMB 12.5* 05/08/2011   TROPONINI 1.47* 05/08/2011    Lab Results  Component Value Date   CHOL 242* 03/06/2011   CHOL  Value: 145        ATP III CLASSIFICATION:  <200     mg/dL   Desirable  045-409  mg/dL   Borderline High  >=811    mg/dL   High        03/14/4781   CHOL  Value: 133        ATP III CLASSIFICATION:  <200     mg/dL   Desirable  956-213  mg/dL   Borderline High  >=086    mg/dL   High        57/84/6962   Lab Results  Component Value Date   HDL 41 03/06/2011   HDL 38* 07/21/2010   HDL 42 95/28/4132   Lab Results  Component Value Date   LDLCALC Comment:   Not calculated due to Triglyceride >400. Suggest ordering Direct LDL (Unit Code: 44010).   Total Cholesterol/HDL Ratio:CHD Risk                        Coronary Heart Disease Risk  Table                                         Men       Women          1/2 Average Risk              3.4        3.3              Average Risk              5.0        4.4           2X Average Risk              9.6        7.1           3X Average Risk             23.4       11.0 Use the calculated Patient Ratio above and the CHD Risk table  to determine the patient's CHD Risk. ATP III Classification (LDL):       < 100        mg/dL         Optimal      045 - 129     mg/dL         Near or Above Optimal      130 - 159     mg/dL         Borderline High      160 - 189     mg/dL         High       > 409        mg/dL         Very High   02/21/9146   LDLCALC  Value: 85        Total Cholesterol/HDL:CHD Risk Coronary Heart Disease Risk Table                     Men   Women  1/2 Average Risk   3.4   3.3  Average Risk       5.0   4.4  2 X Average Risk   9.6   7.1  3 X Average Risk  23.4   11.0        Use the calculated Patient Ratio above and the CHD Risk Table to determine the patient's CHD Risk.        ATP III CLASSIFICATION (LDL):  <100     mg/dL   Optimal  829-562  mg/dL   Near or Above                    Optimal  130-159  mg/dL   Borderline  130-865  mg/dL   High  >784     mg/dL   Very High 12/20/6293   LDLCALC  Value: 58        Total Cholesterol/HDL:CHD Risk Coronary Heart Disease Risk Table                     Men   Women  1/2 Average Risk   3.4   3.3  Average Risk       5.0   4.4  2 X Average Risk   9.6  7.1  3 X Average Risk  23.4   11.0        Use the calculated Patient Ratio above and the CHD Risk Table to determine the patient's CHD Risk.        ATP III CLASSIFICATION (LDL):  <100     mg/dL   Optimal  119-147  mg/dL   Near or Above                    Optimal  130-159  mg/dL   Borderline  829-562  mg/dL   High  >130     mg/dL   Very High 86/57/8469   Lab Results  Component Value Date   TRIG 405* 03/06/2011   TRIG 108 07/21/2010   TRIG 165* 05/26/2010   Lab Results  Component Value Date   CHOLHDL 5.9 03/06/2011   CHOLHDL  3.8 07/21/2010   CHOLHDL 3.2 05/26/2010       Radiology:   IMPRESSION:  Stable linear atelectasis or scarring in both lung bases. No acute  cardiopulmonary process.   EKG: Normal sinus rhythm rate of 70 bpm Nonspecific T wave abnormality When compared with ECG of 02-Apr-2011 08:27, No significant change was found   ASSESSMENT AND PLAN:   1. NSTEMI: It appears that his "heartburn" is likely cardiac in etiology. He has refused a stress test in the past. but is willing to have cardiac catheterization if this is necessary. He is currently pain free and is placed on heparin drip. He remains on  Plavix, ASA, and carvedilol.  He is known to be noncompliant. Repeat EKG in am to evaluate for ischemic changes.  2. Mixed hyperlipidemia:  Current labs show poor control of this with elevated  TC and TG.  Will continue crestor but will need to increase the dose to 20 mg daily. He admits to dietary noncompliance eating foods that are not adherent to diabetic heart healthy diet.   3. Hypertension: Currently well controlled on current medication regimen. No changes at this time.  Signed: Bettey Mare. Lyman Bishop NP [facility] 05/08/2011, 10:50 AM  Cardiology Attending Patient interviewed and examined. Discussed with Joni Reining, NP.  Above note annotated and modified based upon my findings.  In the past, CPK and CPK-MB have been elevated to a similar extent as seen today. This may represent an adverse effect of statin, but is not of adequate severity to warrant discontinuation of that drug. This admission, however, troponin was also significantly elevated suggesting myocardial injury. There is a significant risk for restenosis of the  drug-eluting stents  placed in a saphenous vein 10 months ago.  Progression of disease in the native vessels is also a consideration. Since patient cannot tell the difference symptomatically between gastroesophageal reflux symptoms and myocardial  ischemia/infarction, cardiac catheterization is warranted to clarify the situation. Transfer to [facility] will be arranged in the morning if catheterization can be performed.  [place] Bing, MD

## 2011-05-09 ENCOUNTER — Inpatient Hospital Stay (HOSPITAL_COMMUNITY)
Admission: AD | Admit: 2011-05-09 | Discharge: 2011-05-11 | DRG: 287 | Disposition: A | Discharge status: Home / Self Care | Payer: Medicare Other | Source: Ambulatory Visit | Attending: Cardiovascular Disease | Admitting: Cardiovascular Disease

## 2011-05-09 DIAGNOSIS — I2510 Atherosclerotic heart disease of native coronary artery without angina pectoris: Secondary | ICD-10-CM

## 2011-05-09 LAB — CARDIAC PANEL(CRET KIN+CKTOT+MB+TROPI)
CK, MB: 10.4 ng/mL (ref 0.3–4.0)
Relative Index: 3.1 — ABNORMAL HIGH (ref 0.0–2.5)
Total CK: 335 U/L — ABNORMAL HIGH (ref 7–232)

## 2011-05-09 LAB — CBC
Platelets: 117 10*3/uL — ABNORMAL LOW (ref 150–400)
RBC: 4.3 MIL/uL (ref 4.22–5.81)
RDW: 12.6 % (ref 11.5–15.5)
WBC: 5.2 10*3/uL (ref 4.0–10.5)

## 2011-05-09 LAB — BASIC METABOLIC PANEL
CO2: 28 mEq/L (ref 19–32)
Calcium: 9.2 mg/dL (ref 8.4–10.5)
Chloride: 99 mEq/L (ref 96–112)
GFR calc Af Amer: >90 mL/min (ref >90)
Sodium: 136 mEq/L (ref 135–145)

## 2011-05-09 LAB — GLUCOSE, CAPILLARY
Glucose-Capillary: 213 mg/dL — ABNORMAL HIGH (ref 70–99)
Glucose-Capillary: 161 mg/dL — ABNORMAL HIGH (ref 70–99)

## 2011-05-09 MED ORDER — HEPARIN (PORCINE) IN NACL 100-0.45 UNIT/ML-% IJ SOLN: 1800.0000 [IU]/h | INTRAMUSCULAR | Status: DC

## 2011-05-09 MED ORDER — SODIUM CHLORIDE 0.9 % IJ SOLN
INTRAMUSCULAR | Status: AC
  Filled 2011-05-09: qty 10

## 2011-05-09 MED ORDER — HEPARIN (PORCINE) IN NACL 100-0.45 UNIT/ML-% IJ SOLN: 18.0000 [IU]/kg/h | INTRAMUSCULAR | Status: DC

## 2011-05-09 NOTE — Progress Notes (Signed)
Inpatient Diabetes Program Recommendations  AACE/ADA: New Consensus Statement on Inpatient Glycemic Control (2009)  Target Ranges:  Prepandial:   less than 140 mg/dL      Peak postprandial:   less than 180 mg/dL (1-2 hours)      Critically ill patients:  140 - 180 mg/dL   Reason for Visit: Elevated fasting glucose:  234 mg/dL  Inpatient Diabetes Program Recommendations Insulin - Basal: Add Lantus 20 units daily Correction (SSI): Increase to Moderate Novolog Correction TID and HS Diet: Add CHO modified medium to diet once resumes po intake

## 2011-05-09 NOTE — Consult Note (Signed)
ANTICOAGULATION CONSULT NOTE   Pharmacy Consult for IV Heparin Indication: chest pain/ACS  No Known Allergies  Patient Measurements: Height: 5\' 10"  (177.8 cm) Weight: 178 lb 5.6 oz (80.9 kg) IBW/kg (Calculated) : 73   Vital Signs: Temp: 98.3 F (36.8 C) (10/26 0634) Temp src: Oral (10/26 0634) BP: 125/69 mmHg (10/26 0634) Pulse Rate: 65  (10/26 0831)  Labs:  Basename 05/09/11 0800 05/09/11 0525 05/08/11 2335 05/08/11 1521 05/08/11 0738 05/08/11 0513 05/07/11 1017  HGB -- 13.4 -- -- -- 12.5* --  HCT -- 39.5 -- -- -- 36.8* 37.9*  PLT -- 117* -- -- -- 124* 135*  APTT -- -- -- -- -- -- --  LABPROT -- -- -- -- -- 14.1 --  INR -- -- -- -- -- 1.07 --  HEPARINUNFRC 0.53 -- 0.26* 0.15* -- -- --  CREATININE -- 0.78 -- -- -- 0.81 0.78  CKTOTAL -- 335* -- 370* 364* -- --  CKMB -- 10.4* -- 11.7* 12.5* -- --  TROPONINI -- 0.82* -- 0.95* 1.47* -- --   Estimated Creatinine Clearance: 76 ml/min (by C-G formula based on Cr of 0.78).  Medical History: Past Medical History  Diagnosis Date  . Coronary artery disease   . Diabetes mellitus   . Hypertension   . PUD (peptic ulcer disease)   . Hypercholesteremia   . Myocardial infarct, old   . GERD (gastroesophageal reflux disease)   . Arthritis    Medications:  Scheduled:     . antiseptic oral rinse   Mouth Rinse BID  . aspirin EC  325 mg Oral Daily  . carvedilol  6.25 mg Oral BID WC  . clopidogrel  75 mg Oral Daily  . docusate sodium  100 mg Oral BID  . influenza  inactive virus vaccine  0.5 mL Intramuscular Once  . insulin aspart  0-9 Units Subcutaneous TID WC  . multivitamins ther. w/minerals  1 tablet Oral Daily  . nitrofurantoin (macrocrystal-monohydrate)  100 mg Oral QHS  . pantoprazole  40 mg Oral Q1200  . polyethylene glycol  17 g Oral Daily  . ramipril  10 mg Oral Daily  . rosuvastatin  40 mg Oral q1800  . sodium chloride      . Tamsulosin HCl  0.4 mg Oral QHS  . DISCONTD: rosuvastatin  10 mg Oral Daily  .  DISCONTD: rosuvastatin  20 mg Oral Daily  . DISCONTD: sodium chloride  3 mL Intravenous Q12H   Assessment: Heparin level therapeutic today Platelets dropping  Goal of Therapy:  Heparin level 0.3-0.7 units/ml   Plan: Continue heparin at current rate F/U platelets tomorrow Heparin level daily Cbc daily x 3.  Margo Aye, Jamarquis Crull A 05/09/2011,9:54 AM

## 2011-05-09 NOTE — Progress Notes (Signed)
Patient transferred to [facility] unit 3700, report called to [place], Charity fundraiser. Daughter [name] [phone] notified.

## 2011-05-09 NOTE — Progress Notes (Signed)
CRITICAL VALUE ALERT  Critical value received:  Troponin 0.82 ckmb 10.4  Date of notification:  05/09/11  Time of notification: 0635  Critical value read back: yes  Nurse who received alert:  Foye Deer RN  MD notified (1st page):  Dr. Barnie Del  Time of first page:  [phone]  MD notified (2nd page): Dr. Barnie Del  Time of second RUEA:[phone]  Responding MD:  No - passed onto 1st shift nurse  Time MD responded:  N/A

## 2011-05-10 LAB — BASIC METABOLIC PANEL
Calcium: 9.5 mg/dL (ref 8.4–10.5)
GFR calc Af Amer: >90 mL/min (ref >90)
GFR calc non Af Amer: 85 mL/min — ABNORMAL LOW (ref >90)
Glucose, Bld: 198 mg/dL — ABNORMAL HIGH (ref 70–99)
Sodium: 137 mEq/L (ref 135–145)

## 2011-05-10 LAB — CARDIAC PANEL(CRET KIN+CKTOT+MB+TROPI): Relative Index: 3.6 — ABNORMAL HIGH (ref 0.0–2.5)

## 2011-05-10 LAB — CBC
MCH: 30.8 pg (ref 26.0–34.0)
MCHC: 34.2 g/dL (ref 30.0–36.0)
Platelets: 134 10*3/uL — ABNORMAL LOW (ref 150–400)
RDW: 12.8 % (ref 11.5–15.5)

## 2011-05-10 LAB — GLUCOSE, CAPILLARY
Glucose-Capillary: 244 mg/dL — ABNORMAL HIGH (ref 70–99)
Glucose-Capillary: 205 mg/dL — ABNORMAL HIGH (ref 70–99)

## 2011-05-11 DIAGNOSIS — I214 Non-ST elevation (NSTEMI) myocardial infarction: Secondary | ICD-10-CM

## 2011-05-17 ENCOUNTER — Emergency Department (HOSPITAL_COMMUNITY)
Admission: EM | Admit: 2011-05-17 | Discharge: 2011-05-17 | Disposition: A | Discharge status: Home / Self Care | Payer: Medicare Other | Attending: Emergency Medicine | Admitting: Emergency Medicine

## 2011-05-17 ENCOUNTER — Other Ambulatory Visit: Payer: Self-pay

## 2011-05-17 ENCOUNTER — Encounter (HOSPITAL_COMMUNITY): Payer: Self-pay | Admitting: Emergency Medicine

## 2011-05-17 ENCOUNTER — Emergency Department (HOSPITAL_COMMUNITY): Payer: Medicare Other

## 2011-05-17 DIAGNOSIS — E119 Type 2 diabetes mellitus without complications: Secondary | ICD-10-CM | POA: Insufficient documentation

## 2011-05-17 DIAGNOSIS — K219 Gastro-esophageal reflux disease without esophagitis: Secondary | ICD-10-CM | POA: Insufficient documentation

## 2011-05-17 DIAGNOSIS — E7800 Pure hypercholesterolemia, unspecified: Secondary | ICD-10-CM | POA: Insufficient documentation

## 2011-05-17 DIAGNOSIS — I10 Essential (primary) hypertension: Secondary | ICD-10-CM | POA: Insufficient documentation

## 2011-05-17 DIAGNOSIS — R5381 Other malaise: Secondary | ICD-10-CM | POA: Insufficient documentation

## 2011-05-17 DIAGNOSIS — Z951 Presence of aortocoronary bypass graft: Secondary | ICD-10-CM | POA: Insufficient documentation

## 2011-05-17 DIAGNOSIS — R531 Weakness: Secondary | ICD-10-CM

## 2011-05-17 DIAGNOSIS — M129 Arthropathy, unspecified: Secondary | ICD-10-CM | POA: Insufficient documentation

## 2011-05-17 DIAGNOSIS — I252 Old myocardial infarction: Secondary | ICD-10-CM | POA: Insufficient documentation

## 2011-05-17 DIAGNOSIS — Z87891 Personal history of nicotine dependence: Secondary | ICD-10-CM | POA: Insufficient documentation

## 2011-05-17 DIAGNOSIS — R5383 Other fatigue: Secondary | ICD-10-CM | POA: Insufficient documentation

## 2011-05-17 DIAGNOSIS — K279 Peptic ulcer, site unspecified, unspecified as acute or chronic, without hemorrhage or perforation: Secondary | ICD-10-CM | POA: Insufficient documentation

## 2011-05-17 DIAGNOSIS — I2510 Atherosclerotic heart disease of native coronary artery without angina pectoris: Secondary | ICD-10-CM | POA: Insufficient documentation

## 2011-05-17 LAB — COMPREHENSIVE METABOLIC PANEL
ALT: 22 U/L (ref 0–53)
AST: 20 U/L (ref 0–37)
Albumin: 3.4 g/dL — ABNORMAL LOW (ref 3.5–5.2)
Calcium: 9.7 mg/dL (ref 8.4–10.5)
Sodium: 133 mEq/L — ABNORMAL LOW (ref 135–145)
Total Protein: 6.5 g/dL (ref 6.0–8.3)

## 2011-05-17 LAB — CBC
MCH: 31 pg (ref 26.0–34.0)
MCV: 91 fL (ref 78.0–100.0)
Platelets: 164 10*3/uL (ref 150–400)
RDW: 13 % (ref 11.5–15.5)
WBC: 6.6 10*3/uL (ref 4.0–10.5)

## 2011-05-17 LAB — GLUCOSE, CAPILLARY

## 2011-05-17 LAB — DIFFERENTIAL
Basophils Absolute: 0 10*3/uL (ref 0.0–0.1)
Eosinophils Absolute: 0.3 10*3/uL (ref 0.0–0.7)
Eosinophils Relative: 4 % (ref 0–5)
Lymphocytes Relative: 20 % (ref 12–46)

## 2011-05-17 NOTE — ED Provider Notes (Signed)
History    Scribed for Jeffrey Lennert, MD, the patient was seen in room APA19/APA19. This chart was scribed by Jeffrey [name].   CSN: [id number] Arrival date & time: 05/17/2011  9:40 AM   First MD Initiated Contact with Patient 05/17/11 [phone]      Chief Complaint  Patient presents with  . Weakness    (Consider location/radiation/quality/duration/timing/severity/associated sxs/prior treatment) HPI Jeffrey [name] is a 77 y.o. male who presents to the Emergency Department complaining of persistent mild to moderate fatigue.  Patient reports fatigue for the last week since he was discharged from [facility].  Patient states he has "no energy and is tired."  Patient has no other complaints.  Fatigue is relieved by nothing.  Patient had heart catheterization to check the 3 stents placed in January.  Stents were normal and no new stents were placed.  Patient was hospitalized for 4-5 days.   Patient was discharged and was told by cardiologist to follow up with his PCP this week.   Patient was not able to get follow up appointment with Dr. Sherwood [name] this AM.   Patient lives alone.   Patient with hx of MI, PNA, GERD, hypercholesterolemia, DM, HTN and CAD.      PCP Jeffrey [name]., MD    Past Medical History  Diagnosis Date  . Coronary artery disease   . Diabetes mellitus   . Hypertension   . PUD (peptic ulcer disease)   . Hypercholesteremia   . Myocardial infarct, old   . GERD (gastroesophageal reflux disease)   . Arthritis     Past Surgical History  Procedure Date  . Tonsillectomy   . Post aortocoronary bypass surgery   . Coronary artery bypass graft     History reviewed. No pertinent family history.  History  Substance Use Topics  . Smoking status: Former Smoker    Types: Cigarettes  . Smokeless tobacco: Current User    Types: Chew  . Alcohol Use: No      Review of Systems  Constitutional: Positive for fatigue.  HENT: Negative for congestion, sinus pressure and ear  discharge.   Eyes: Negative for discharge.  Respiratory: Negative for cough.   Cardiovascular: Negative for chest pain.  Gastrointestinal: Negative for abdominal pain and diarrhea.  Genitourinary: Negative for frequency and hematuria.  Musculoskeletal: Negative for back pain.  Skin: Negative for rash.  Neurological: Negative for seizures and headaches.  Hematological: Negative.   Psychiatric/Behavioral: Negative for hallucinations.    Allergies  Review of patient's allergies indicates no known allergies.  Home Medications   Current Outpatient Rx  Name Route Sig Dispense Refill  . ASPIRIN 81 MG PO TABS Oral Take 81 mg by mouth daily.     Marland Kitchen PEPTO-BISMOL PO Oral Take by mouth daily as needed. Patient states that he just takes a couple of swallows when needed to control bowels.     Marland Kitchen CARVEDILOL 6.25 MG PO TABS Oral Take 6.25 mg by mouth 2 (two) times daily with a meal.     . CLOPIDOGREL BISULFATE 75 MG PO TABS Oral Take 75 mg by mouth daily.     . COD LIVER OIL PO Oral Take 1 capsule by mouth daily.      Marland Kitchen DOCUSATE SODIUM 100 MG PO TABS Oral Take 100 mg by mouth daily as needed. Constipation     . LORATADINE 10 MG PO TABS Oral Take 10 mg by mouth daily.      Marland Kitchen METFORMIN HCL 500 MG PO  TABS Oral Take 500 mg by mouth 3 (three) times daily.     Carma Leaven M PLUS PO TABS Oral Take 1 tablet by mouth daily.      Marland Kitchen NITROFURANTOIN MONOHYD MACRO 100 MG PO CAPS Oral Take 100 mg by mouth at bedtime.     Marland Kitchen PANTOPRAZOLE SODIUM 40 MG PO TBEC Oral Take 40 mg by mouth daily.      Marland Kitchen RAMIPRIL 10 MG PO TABS Oral Take 10 mg by mouth daily.     Marland Kitchen TAMSULOSIN HCL 0.4 MG PO CAPS Oral Take 0.4 mg by mouth at bedtime.     Marland Kitchen NITROGLYCERIN 0.4 MG SL SUBL Sublingual Place 0.4 mg under the tongue every 5 (five) minutes as needed. Chest pain/patient said he has never had to use medication as he takes Rolaids if he gets chest pain and that stops pain       BP 155/80  Pulse 74  Temp(Src) 97.3 F (36.3 C) (Oral)  Resp  19  Ht 5\' 10"  (1.778 m)  Wt 180 lb (81.647 kg)  BMI 25.83 kg/m2  SpO2 99%  Physical Exam  Nursing note and vitals reviewed. Constitutional: He is oriented to person, place, and time. He appears well-developed. No distress.  HENT:  Head: Normocephalic and atraumatic.  Eyes: Conjunctivae and EOM are normal. No scleral icterus.  Neck: Neck supple. No thyromegaly present.  Cardiovascular: Normal rate, regular rhythm and intact distal pulses.  Exam reveals no gallop and no friction rub.   No murmur heard.      Femoral pulse palpable   Pulmonary/Chest: Effort normal and breath sounds normal. No stridor. No respiratory distress. He has no wheezes. He has no rales. He exhibits no tenderness.  Abdominal: Soft. He exhibits no distension. There is no tenderness. There is no rebound.  Musculoskeletal: Normal range of motion. He exhibits no edema.  Lymphadenopathy:    He has no cervical adenopathy.  Neurological: He is alert and oriented to person, place, and time. Coordination normal.  Skin: No rash noted. No erythema.  Psychiatric: He has a normal mood and affect. His behavior is normal.    ED Course  Procedures (including critical care time)   DIAGNOSTIC STUDIES: Oxygen Saturation is 97% on room air, normal by my interpretation.    COORDINATION OF CARE:  10:02 AM  Physical exam complete.  Will review prior records and order labs.   1:38 PM  Reviewed prior records.   1:43 PM  Recheck.  Plan to discharge patient home.  Patient agrees with plan.      Orders Placed This Encounter  Procedures  . DG Chest 2 View  . CBC  . Differential  . Comprehensive metabolic panel  . Glucose, capillary  . Diet Heart  . Telemetry monitoring  . EKG test      LABS / RADIOLOGY:   Labs Reviewed  CBC - Abnormal; Notable for the following:    RBC 4.13 (*)    Hemoglobin 12.8 (*)    HCT 37.6 (*)    All other components within normal limits  COMPREHENSIVE METABOLIC PANEL - Abnormal; Notable  for the following:    Sodium 133 (*)    Glucose, Bld 219 (*)    Albumin 3.4 (*)    Total Bilirubin 0.2 (*)    GFR calc non Af Amer 84 (*)    All other components within normal limits  GLUCOSE, CAPILLARY - Abnormal; Notable for the following:    Glucose-Capillary 204 (*)  All other components within normal limits  DIFFERENTIAL   Results for orders placed during the hospital encounter of 05/17/11  CBC      Component Value Range   WBC 6.6  4.0 - 10.5 (K/uL)   RBC 4.13 (*) 4.22 - 5.81 (MIL/uL)   Hemoglobin 12.8 (*) 13.0 - 17.0 (g/dL)   HCT 40.9 (*) 81.1 - 52.0 (%)   MCV 91.0  78.0 - 100.0 (fL)   MCH 31.0  26.0 - 34.0 (pg)   MCHC 34.0  30.0 - 36.0 (g/dL)   RDW 91.4  78.2 - 95.6 (%)   Platelets 164  150 - 400 (K/uL)  DIFFERENTIAL      Component Value Range   Neutrophils Relative 67  43 - 77 (%)   Neutro Abs 4.4  1.7 - 7.7 (K/uL)   Lymphocytes Relative 20  12 - 46 (%)   Lymphs Abs 1.3  0.7 - 4.0 (K/uL)   Monocytes Relative 9  3 - 12 (%)   Monocytes Absolute 0.6  0.1 - 1.0 (K/uL)   Eosinophils Relative 4  0 - 5 (%)   Eosinophils Absolute 0.3  0.0 - 0.7 (K/uL)   Basophils Relative 1  0 - 1 (%)   Basophils Absolute 0.0  0.0 - 0.1 (K/uL)  COMPREHENSIVE METABOLIC PANEL      Component Value Range   Sodium 133 (*) 135 - 145 (mEq/L)   Potassium 4.3  3.5 - 5.1 (mEq/L)   Chloride 98  96 - 112 (mEq/L)   CO2 26  19 - 32 (mEq/L)   Glucose, Bld 219 (*) 70 - 99 (mg/dL)   BUN 13  6 - 23 (mg/dL)   Creatinine, Ser 2.13  0.50 - 1.35 (mg/dL)   Calcium 9.7  8.4 - 08.6 (mg/dL)   Total Protein 6.5  6.0 - 8.3 (g/dL)   Albumin 3.4 (*) 3.5 - 5.2 (g/dL)   AST 20  0 - 37 (U/L)   ALT 22  0 - 53 (U/L)   Alkaline Phosphatase 65  39 - 117 (U/L)   Total Bilirubin 0.2 (*) 0.3 - 1.2 (mg/dL)   GFR calc non Af Amer 84 (*) >90 (mL/min)   GFR calc Af Amer >90  >90 (mL/min)  GLUCOSE, CAPILLARY      Component Value Range   Glucose-Capillary 204 (*) 70 - 99 (mg/dL)   Comment 1 Documented in Chart      Comment 2 Notify RN       Dg Chest 2 View  05/17/2011  *RADIOLOGY REPORT*  Clinical Data: Weakness  CHEST - 2 VIEW  Comparison: 05/07/2011  Findings: 1044 hours. The lungs are clear without focal infiltrate, edema, pneumothorax or pleural effusion. The cardiopericardial silhouette is within normal limits for size.  The patient is status post CABG.   Imaged bony structures of the thorax are intact. Telemetry leads overlie the chest.  IMPRESSION: Stable exam.  No acute cardiopulmonary process.  Original Report Authenticated By: ERIC A. MANSELL, M.D.         MDM   VHQ:IONGEXB.  cad     MEDICATIONS GIVEN IN THE E.D. Scheduled Meds:   Continuous Infusions:      IMPRESSION: 1. Weakness      DISCHARGE MEDICATIONS: New Prescriptions   No medications on file   Pt at discharge much improved. Not chest pain mild weakness.  Cath report reviewed.  One stent occluded but good collateralization.  tx medically per cardiology   The chart was scribed for  me under my direct supervision.  I personally performed the history, physical, and medical decision making and all procedures in the evaluation of this patient.Jeffrey Lennert, MD 05/17/11 1350

## 2011-05-17 NOTE — ED Notes (Signed)
MD at bedside. 

## 2011-05-17 NOTE — ED Notes (Signed)
Pt reports was admitted last week to [facility] hospital.  Says was told he had a "light heart attack."  PT reports had a cardiac cath last week and his stents were checked.  PT says has been weak but feels like his generalized weakness has gotten worse.  Pt alert and oriented. Says weakness is all over, not one sided.  Denies any c/p; or SOB.  EDP at bedside.  Radial pulses present, no obvious swelling in lower extremities.  Pt says was instructed to f/u with [facility] medical this week but when went there this am was told was too late to see pcp and was told to come to ed for evaluation.

## 2011-05-17 NOTE — ED Notes (Signed)
Pt given lunch tray.

## 2011-05-17 NOTE — ED Notes (Signed)
Pt c/o weakness since being discharged from the hospital Sunday.

## 2011-05-17 NOTE — ED Notes (Signed)
Pt requesting something to eat.  

## 2011-05-21 ENCOUNTER — Other Ambulatory Visit (HOSPITAL_COMMUNITY): Payer: Self-pay | Admitting: Internal Medicine

## 2011-05-21 DIAGNOSIS — R0602 Shortness of breath: Secondary | ICD-10-CM

## 2011-05-23 ENCOUNTER — Ambulatory Visit (HOSPITAL_COMMUNITY)
Admission: RE | Admit: 2011-05-23 | Discharge: 2011-05-23 | Disposition: A | Discharge status: Home / Self Care | Payer: Medicare Other | Source: Ambulatory Visit | Attending: Internal Medicine | Admitting: Internal Medicine

## 2011-05-23 DIAGNOSIS — R0602 Shortness of breath: Secondary | ICD-10-CM | POA: Insufficient documentation

## 2011-05-23 DIAGNOSIS — Z951 Presence of aortocoronary bypass graft: Secondary | ICD-10-CM | POA: Insufficient documentation

## 2011-05-23 MED ORDER — IOHEXOL 350 MG/ML SOLN: 100.0000 mL | Freq: Once | INTRAVENOUS | Status: AC | PRN

## 2011-05-24 NOTE — Discharge Summary (Signed)
NAME:  Jeffrey [name], Jeffrey [name] NO.:  [id number]  MEDICAL RECORD NO.:  [id number]  LOCATION:  3702                         FACILITY:  [facility]  PHYSICIAN:  Jeffrey Abed, MD, FACCDATE OF BIRTH:  April 08, 1929  DATE OF ADMISSION:  05/09/2011 DATE OF DISCHARGE:  05/11/2011                              DISCHARGE SUMMARY   PRIMARY CARDIOLOGIST:  Jeffrey Friends. Dietrich Pates, MD, [facility]  PRIMARY CARE PHYSICIAN:  Jeffrey Rear. Fusco, MD  PRIMARY REASON FOR ADMISSION:  Non ST-elevation myocardial infarction.  DISCHARGE DIAGNOSES: 1. Coronary artery disease with non ST-elevation myocardial     infarction, treated medically.     a.     Status post prior coronary artery bypass grafting.     b.     Status post prior stenting to the vein graft to the obtuse      marginal 2.     c.     Preserved left ventricular function with an ejection      fraction of 60% by cardiac catheterizations this admission. 2. Folliculitis on his back. 3. Diabetes mellitus type 2. 4. Hypertension. 5. Hyperlipidemia. 6. Peptic ulcer disease. 7. Degenerative joint disease. 8. History of esophageal dilatation. 9. History of pancreatitis January 2012.  PROCEDURES PERFORMED THIS ADMISSION:  Cardiac catheterization by Dr. Marca [name] May 09, 2011.  Please see the dictated note for complete details.  The patient was noted to have severe multivessel CAD. LIMA to the LAD was patent.  Vein graft to the RCA was known to be occluded.  Sequential vein graft to the obtuse marginal 1 and obtuse marginal 2 demonstrated patency to the obtuse marginal 1, but the stented limb to the obtuse marginal 2 was totally occluded.  There were collaterals to the obtuse marginal 2 coming from obtuse marginal 1 and obtuse marginal 2 supplies small territory.  Therefore, medical therapy was planned.  HOSPITAL COURSE:  [name] is an 77 year old male patient who was seen in consultation at [facility] on May 08, 2011, due  to chest discomfort and elevated cardiac enzymes.  It was felt that he had suffered a non ST-elevation myocardial infarction.  He was transferred to [facility] for further evaluation and treatment.  He underwent cardiac catheterization by Dr. Shirlee [name] as outlined above. There were no targets for PCI and medical therapy was continued.  He remained stable over the next couple of days without any further chest pain or shortness of breath.  Of note, he was noted to have a rash on his back and upon my assessment this appeared to be mild folliculitis. The patient notes that he has this is and it recurs chronically over the last several years.  He does not generally take any oral therapies for this.  It has been recommended that he use Neosporin twice daily until clear and then he can follow up with his PCP if there is no improvement over the next several days.  He was evaluated by Dr. Myrtis [name] on the morning of May 11, 2011, and felt stable enough for discharge to home.  LABORATORY AND AXILLARY DATA:  Hemoglobin 13.1, platelet count 134,000. Potassium 4.2, creatinine 0.73.  ALT 24.  Peak troponin  1.47, peak CK-MB 17.8.  Chest x-ray on May 07, 2011, stable linear atelectasis or scarring in both lung bases.  No acute cardiopulmonary process.  DISCHARGE MEDICATIONS: 1. Aspirin 81 mg daily. 2. Carvedilol 6.25 mg twice daily. 3. Plavix 75 mg daily. 4. Macrobid 100 mg daily at bedtime. 5. Metformin 500 mg 3 times a day. 6. Multivitamin daily. 7. Nitroglycerin p.r.n. chest pain. 8. Protonix 40 mg daily. 9. Ramipril 10 mg daily. 10.Crestor 10 mg daily. 11.Flomax 0.4 mg at bedtime. 12.Krill oil 300 mg daily. 13.The patient had aspirin 325 listed as well as 81 mg.  He has been     advised to stop taking the 325 mg of aspirin.  He has also been     asked to hold his Relafen.  ACTIVITIES:  Increase activity slowly.  No driving, lifting, or sexual activity for 2 weeks.  DIET:   Low-fat, low-sodium, diabetic diet.  WOUND CARE:  He should call our office in [place] for any swelling, bleeding, bruising, or fever.  FOLLOWUP: 1. He will see Dr. Dietrich [name] in the next 2 weeks in [facility]     and the office will contact with an appointment. 2. As noted, I recommended he use Neosporin on his areas on his back.     If there is no improvement, he should follow up with his primary     care physician over the next week for further management.  Total physician and PA time greater than 30 minutes.     Jeffrey Newcomer, PA-C   ______________________________ Jeffrey Abed, MD, [facility]   SW/MEDQ  D:  05/11/2011  T:  05/11/2011  Job:  [id number]  cc:   Jeffrey Rear. Jeffrey Gambler, MD  Electronically Signed by Jeffrey Newcomer PA-C on 05/17/2011 08:20:53 PM Electronically Signed by Jeffrey Rough MD FACC on 05/24/2011 06:32:56 AM

## 2011-05-24 NOTE — Cardiovascular Report (Signed)
NAME:  Jeffrey [name], Jeffrey [name] NO.:  [id number]  MEDICAL RECORD NO.:  [id number]  LOCATION:  3702                         FACILITY:  [facility]  PHYSICIAN:  Marca Ancona, MD      DATE OF BIRTH:  1929-04-05  DATE OF PROCEDURE:  05/09/2011 DATE OF DISCHARGE:                           CARDIAC CATHETERIZATION   PROCEDURE: 1. Left heart catheterization. 2. LIMA angiography. 3. Saphenous vein graft angiography. 4. Coronary angiography. 5. Left ventriculogram.  INDICATION:  This is an 77 year old with history of coronary disease status post bypass surgery who had non-ST elevation MI, was transferred down from [facility].  PROCEDURE IN DETAIL:  After informed consent was obtained, the right groin was sterilely prepped and draped.  Lidocaine 1% was used to locally anesthetize right groin area.  The right common femoral artery was entered using modified Seldinger technique, and a 5-French arterial sheath was placed.  The LIMA was engaged using the JL-4 catheter.  The saphenous vein grafts were engaged using the JR-4 catheter, the right coronary artery was engaged using the JR-4 catheter, left coronary artery was engaged using the JL-4 catheter.  Left ventricle was entered using angled pigtail catheter and ventriculogram was done.  There were no complications.  FINDINGS: 1. Hemodynamics:  LV 177/10, aorta 169/80. 2. Left ventriculography.  EF was estimated to be 60%.  There was     severe mid to apical inferior hypokinesis. 3. Right coronary artery.  The right coronary artery was a dominant     vessel.  There is subtotal occlusion in the proximal right     coronary.  There were right-to-right collaterals was reconstitution     of the mid right coronary.  Then, the right coronary was totally     occluded in the midportion.  The PDA reconstituted by left-to-right     collaterals from the distal LAD.  There was a saphenous vein graft     to the RCA that was totally  occluded.  This has been known from     prior catheterizations. 4. Left main.  The left main had luminal irregularities. 5. Left circumflex system.  The left circumflex is totally occluded in     the midportion.  There is a sequential saphenous vein graft to the     first and second obtuse marginals.  This saphenous vein graft had a     proximal stent that had about 30-40% in-stent restenosis.  The     graft itself artery, however, was patent to the first obtuse     marginal, which was a moderate-sized vessel.  The limb of the     sequential saphenous vein graft that continued on to the second     obtuse marginal was totally occluded.  There was a stent in that     segment that was totally occluded.  The second obtuse marginal was     a small vessel, and it was refilled by collaterals from the first     obtuse marginal. 6. LAD system.  There was an 80% proximal LAD stenosis.  The mid LAD     was occluded.  There is a patent LIMA to the mid LAD.  There was     about 40% stenosis in the LAD beyond the LIMA touchdown.  IMPRESSION:  The patient has severe native vessel coronary artery disease.  The left internal mammary artery to the left anterior descending is patent.  The saphenous vein graft to the right coronary artery is occluded and has been noted to be occluded on past cath. There is a sequential saphenous vein graft to the first and second obtuse marginals.  This graft is patent at the first obtuse marginal, but the stent ballooned to the second obtuse marginal was totally occluded.  The second obtuse marginal had actually been collateralized from the first obtuse marginal.  The second obtuse marginal does supply a very small tear to vascular territory, therefore, we will plan for medical management.     Marca Ancona, MD     DM/MEDQ  D:  05/09/2011  T:  05/10/2011  Job:  [id number]  Electronically Signed by [name] MD FACC on 05/24/2011 06:32:58 AM

## 2011-05-26 ENCOUNTER — Encounter: Payer: Self-pay | Admitting: Adult Health

## 2011-05-28 ENCOUNTER — Encounter: Payer: Self-pay | Admitting: Adult Health

## 2011-05-28 ENCOUNTER — Ambulatory Visit (INDEPENDENT_AMBULATORY_CARE_PROVIDER_SITE_OTHER): Payer: Medicare Other | Admitting: Adult Health

## 2011-05-28 VITALS — BP 125/81 | HR 79 | Ht 70.0 in | Wt 188.0 lb

## 2011-05-28 DIAGNOSIS — Z951 Presence of aortocoronary bypass graft: Secondary | ICD-10-CM

## 2011-05-28 DIAGNOSIS — I10 Essential (primary) hypertension: Secondary | ICD-10-CM

## 2011-05-28 MED ORDER — ISOSORBIDE MONONITRATE ER 30 MG PO TB24: ORAL_TABLET | ORAL | Status: DC

## 2011-05-28 NOTE — Assessment & Plan Note (Signed)
Currently well controlled at present.  No changes.

## 2011-05-28 NOTE — Patient Instructions (Signed)
Your physician recommends that you schedule a follow-up appointment in: 3 months  Your physician has recommended you make the following change in your medication:  Start Isosorbide 30 mg 1/2 a tablet daily  Low Cholesterol Heart Healthy Diet

## 2011-05-28 NOTE — Progress Notes (Signed)
HPI:  [name] is an 77 y/o patient of Dr. Dietrich Pates we are seeing for ongoing assessment and treatment of CAD, with history of CABG, stent to proximal SVG to L Cx., hypertension, mixed hyperlipidemia, and diabetes. He comes today without cardiac complaint. He states that has been feeling bad the last couple of days with nonspecific complaints. He has had some "heartburn" the last couple of day that has been relieved with Tums. He has refused follow-up stress tests in the past. He admits to dietary noncompliance with diabetic diet.  He states he eats at his church for lunch everyday which is catered by [employer].  He unfortunately suffered a NSTEMI and was hospitalized at [facility] and transferred to  [facility] on 05/09/2011.  Cardiac cath demonstrated severe native vessel disease with patent LIMA to LAD and SVG grafts, however stented limb of the OM2 was totally occluded. He had collaterals noted, with small area of territory supplied by occluded vessel. There were no target vessels.  He was treated medically.   Since discharge he continues to have some complaints of occasional heartburn symptoms. He had been placed on omeprazole with some relief.  He takes tums at other times as stated above.  He continues to eat what he chooses despite education given in the hospital and by me. No Known Allergies  Current Outpatient Prescriptions  Medication Sig Dispense Refill  . aspirin 81 MG tablet Take 81 mg by mouth daily.       . Bismuth Subsalicylate (PEPTO-BISMOL PO) Take by mouth daily as needed. Patient states that he just takes a couple of swallows when needed to control bowels.       . carvedilol (COREG) 6.25 MG tablet Take 6.25 mg by mouth 2 (two) times daily with a meal.       . clopidogrel (PLAVIX) 75 MG tablet Take 75 mg by mouth daily.       . COD LIVER OIL PO Take 1 capsule by mouth daily.        Tery Sanfilippo Sodium (STOOL SOFTENER) 100 MG capsule Take 100 mg by mouth daily as needed. Constipation         . loratadine (CLARITIN) 10 MG tablet Take 10 mg by mouth daily.        . metFORMIN (GLUCOPHAGE) 500 MG tablet Take 500 mg by mouth 3 (three) times daily.       . Multiple Vitamins-Minerals (MULTIVITAMINS THER. W/MINERALS) TABS Take 1 tablet by mouth daily.        . nitroGLYCERIN (NITROSTAT) 0.4 MG SL tablet Place 0.4 mg under the tongue every 5 (five) minutes as needed. Chest pain/patient said he has never had to use medication as he takes Rolaids if he gets chest pain and that stops pain       . pantoprazole (PROTONIX) 40 MG tablet Take 40 mg by mouth daily.        . ramipril (ALTACE) 10 MG tablet Take 10 mg by mouth daily.       . rosuvastatin (CRESTOR) 10 MG tablet Take 10 mg by mouth daily.        . Tamsulosin HCl (FLOMAX) 0.4 MG CAPS Take 0.4 mg by mouth at bedtime.       . isosorbide mononitrate (IMDUR) 30 MG 24 hr tablet Take 1/2 tablet daily  30 tablet  6    Past Medical History  Diagnosis Date  . Coronary artery disease   . Diabetes mellitus   . Hypertension   .  PUD (peptic ulcer disease)   . Hypercholesteremia   . Myocardial infarct, old   . GERD (gastroesophageal reflux disease)   . Arthritis     Past Surgical History  Procedure Date  . Tonsillectomy   . Post aortocoronary bypass surgery   . Coronary artery bypass graft     ZOX:WRUEAV of systems complete and found to be negative unless listed above PHYSICAL EXAM BP 125/81  Pulse 79  Ht 5\' 10"  (1.778 m)  Wt 188 lb (85.276 kg)  BMI 26.98 kg/m2 General: Well developed, well nourished, in no acute distress Head: Eyes PERRLA, Bilateral xanthomas.   Normal cephalic and atramatic  Lungs: Clear bilaterally to auscultation and percussion. Heart: HRRR S1 S2,distant heart sounds.  Pulses are 2+ & equal.            No carotid bruit. No JVD.  No abdominal bruits. No femoral bruits. Abdomen: Bowel sounds are positive, abdomen soft and non-tender without masses or                  Hernia's noted. Msk:  Back normal, normal  gait. Normal strength and tone for age. Extremities: No clubbing, cyanosis or edema.  DP +1. No bleed or bruit in right groin site of catheter insertion.  Neuro: Alert and oriented X 3. Very hard of hearing. Psych:  Good affect, responds appropriately    ASSESSMENT AND PLAN

## 2011-05-28 NOTE — Assessment & Plan Note (Signed)
I will place him on isosorbide mononitrate 15 mg daily for chest discomfort.  He is continuing to feel heartburn symptoms despite current medical treatment.  I have also readvised him on low cholesterol diet and provided written material to him. Will see him back in 3 months.  Will check cholesterol status at that time. Continue current medications.

## 2011-06-04 ENCOUNTER — Other Ambulatory Visit: Payer: Self-pay | Admitting: *Deleted

## 2011-06-04 MED ORDER — CLOPIDOGREL BISULFATE 75 MG PO TABS: 75.0000 mg | ORAL_TABLET | Freq: Every day | ORAL | Status: DC

## 2011-07-04 ENCOUNTER — Other Ambulatory Visit: Payer: Self-pay

## 2011-07-04 ENCOUNTER — Observation Stay (HOSPITAL_COMMUNITY)
Admission: EM | Admit: 2011-07-04 | Discharge: 2011-07-06 | Disposition: A | Discharge status: Home / Self Care | Payer: Medicare Other | Attending: Internal Medicine | Admitting: Internal Medicine

## 2011-07-04 ENCOUNTER — Encounter (HOSPITAL_COMMUNITY): Payer: Self-pay | Admitting: Emergency Medicine

## 2011-07-04 DIAGNOSIS — I10 Essential (primary) hypertension: Secondary | ICD-10-CM | POA: Insufficient documentation

## 2011-07-04 DIAGNOSIS — R079 Chest pain, unspecified: Secondary | ICD-10-CM | POA: Diagnosis present

## 2011-07-04 DIAGNOSIS — I252 Old myocardial infarction: Secondary | ICD-10-CM | POA: Insufficient documentation

## 2011-07-04 DIAGNOSIS — I2510 Atherosclerotic heart disease of native coronary artery without angina pectoris: Secondary | ICD-10-CM | POA: Insufficient documentation

## 2011-07-04 DIAGNOSIS — Z951 Presence of aortocoronary bypass graft: Secondary | ICD-10-CM

## 2011-07-04 DIAGNOSIS — E785 Hyperlipidemia, unspecified: Secondary | ICD-10-CM | POA: Insufficient documentation

## 2011-07-04 DIAGNOSIS — R0789 Other chest pain: Principal | ICD-10-CM | POA: Insufficient documentation

## 2011-07-04 DIAGNOSIS — R748 Abnormal levels of other serum enzymes: Secondary | ICD-10-CM | POA: Diagnosis present

## 2011-07-04 DIAGNOSIS — K219 Gastro-esophageal reflux disease without esophagitis: Secondary | ICD-10-CM | POA: Insufficient documentation

## 2011-07-04 DIAGNOSIS — E119 Type 2 diabetes mellitus without complications: Secondary | ICD-10-CM | POA: Insufficient documentation

## 2011-07-04 LAB — CBC
MCH: 31.3 pg (ref 26.0–34.0)
Platelets: 168 10*3/uL (ref 150–400)
RBC: 4.25 MIL/uL (ref 4.22–5.81)
WBC: 8.2 10*3/uL (ref 4.0–10.5)

## 2011-07-04 LAB — BASIC METABOLIC PANEL
CO2: 25 mEq/L (ref 19–32)
Calcium: 9.9 mg/dL (ref 8.4–10.5)
Chloride: 100 mEq/L (ref 96–112)
Sodium: 134 mEq/L — ABNORMAL LOW (ref 135–145)

## 2011-07-04 LAB — CARDIAC PANEL(CRET KIN+CKTOT+MB+TROPI)
CK, MB: 20.9 ng/mL (ref 0.3–4.0)
Total CK: 646 U/L — ABNORMAL HIGH (ref 7–232)

## 2011-07-04 LAB — GLUCOSE, CAPILLARY
Glucose-Capillary: 185 mg/dL — ABNORMAL HIGH (ref 70–99)
Glucose-Capillary: 220 mg/dL — ABNORMAL HIGH (ref 70–99)

## 2011-07-04 MED ORDER — GI COCKTAIL ~~LOC~~
30.0000 mL | Freq: Three times a day (TID) | ORAL | Status: DC | PRN
  Filled 2011-07-04: qty 30

## 2011-07-04 MED ORDER — ALUM & MAG HYDROXIDE-SIMETH 200-200-20 MG/5ML PO SUSP: 30.0000 mL | Freq: Four times a day (QID) | ORAL | Status: DC | PRN

## 2011-07-04 MED ORDER — ROSUVASTATIN CALCIUM 10 MG PO TABS
10.0000 mg | ORAL_TABLET | Freq: Every day | ORAL | Status: DC
  Administered 2011-07-05 – 2011-07-06 (×2): 10 mg via ORAL
  Filled 2011-07-04 (×2): qty 1

## 2011-07-04 MED ORDER — HEPARIN SODIUM (PORCINE) 5000 UNIT/ML IJ SOLN
5000.0000 [IU] | Freq: Three times a day (TID) | INTRAMUSCULAR | Status: DC
  Administered 2011-07-05 – 2011-07-06 (×5): 5000 [IU] via SUBCUTANEOUS
  Filled 2011-07-04 (×8): qty 1

## 2011-07-04 MED ORDER — PANTOPRAZOLE SODIUM 40 MG PO TBEC
40.0000 mg | DELAYED_RELEASE_TABLET | Freq: Every day | ORAL | Status: DC
  Administered 2011-07-05 – 2011-07-06 (×2): 40 mg via ORAL
  Filled 2011-07-04 (×2): qty 1

## 2011-07-04 MED ORDER — INSULIN ASPART 100 UNIT/ML ~~LOC~~ SOLN
0.0000 [IU] | Freq: Three times a day (TID) | SUBCUTANEOUS | Status: DC
  Administered 2011-07-05: 3 [IU] via SUBCUTANEOUS
  Administered 2011-07-05 – 2011-07-06 (×3): 5 [IU] via SUBCUTANEOUS
  Administered 2011-07-06: 8 [IU] via SUBCUTANEOUS
  Filled 2011-07-04: qty 3

## 2011-07-04 MED ORDER — ACETAMINOPHEN 325 MG PO TABS: 650.0000 mg | ORAL_TABLET | Freq: Four times a day (QID) | ORAL | Status: DC | PRN

## 2011-07-04 MED ORDER — TAMSULOSIN HCL 0.4 MG PO CAPS
0.4000 mg | ORAL_CAPSULE | Freq: Every day | ORAL | Status: DC
  Administered 2011-07-05: 0.4 mg via ORAL
  Filled 2011-07-04 (×2): qty 1

## 2011-07-04 MED ORDER — SODIUM CHLORIDE 0.9 % IJ SOLN: 3.0000 mL | INTRAMUSCULAR | Status: DC | PRN

## 2011-07-04 MED ORDER — ONDANSETRON HCL 4 MG PO TABS: 4.0000 mg | ORAL_TABLET | Freq: Four times a day (QID) | ORAL | Status: DC | PRN

## 2011-07-04 MED ORDER — ASPIRIN 325 MG PO TABS
325.0000 mg | ORAL_TABLET | Freq: Every day | ORAL | Status: DC
  Filled 2011-07-04: qty 1

## 2011-07-04 MED ORDER — ACETAMINOPHEN 650 MG RE SUPP: 650.0000 mg | Freq: Four times a day (QID) | RECTAL | Status: DC | PRN

## 2011-07-04 MED ORDER — ASPIRIN 81 MG PO CHEW
162.0000 mg | CHEWABLE_TABLET | Freq: Once | ORAL | Status: AC
  Administered 2011-07-04: 162 mg via ORAL
  Filled 2011-07-04: qty 2

## 2011-07-04 MED ORDER — CLOPIDOGREL BISULFATE 75 MG PO TABS
75.0000 mg | ORAL_TABLET | Freq: Every day | ORAL | Status: DC
  Administered 2011-07-05 – 2011-07-06 (×2): 75 mg via ORAL
  Filled 2011-07-04 (×2): qty 1

## 2011-07-04 MED ORDER — SODIUM CHLORIDE 0.9 % IV SOLN: 250.0000 mL | INTRAVENOUS | Status: DC | PRN

## 2011-07-04 MED ORDER — ONDANSETRON HCL 4 MG/2ML IJ SOLN: 4.0000 mg | Freq: Four times a day (QID) | INTRAMUSCULAR | Status: DC | PRN

## 2011-07-04 MED ORDER — RAMIPRIL 5 MG PO CAPS
5.0000 mg | ORAL_CAPSULE | Freq: Every day | ORAL | Status: DC
  Administered 2011-07-05 – 2011-07-06 (×2): 5 mg via ORAL
  Filled 2011-07-04 (×2): qty 1

## 2011-07-04 MED ORDER — CARVEDILOL 6.25 MG PO TABS
6.2500 mg | ORAL_TABLET | Freq: Two times a day (BID) | ORAL | Status: DC
  Administered 2011-07-05 – 2011-07-06 (×4): 6.25 mg via ORAL
  Filled 2011-07-04 (×5): qty 1

## 2011-07-04 MED ORDER — ONDANSETRON HCL 4 MG/2ML IJ SOLN: 4.0000 mg | Freq: Three times a day (TID) | INTRAMUSCULAR | Status: DC | PRN

## 2011-07-04 MED ORDER — SENNA 8.6 MG PO TABS
1.0000 | ORAL_TABLET | Freq: Two times a day (BID) | ORAL | Status: DC
  Administered 2011-07-05 – 2011-07-06 (×4): 8.6 mg via ORAL
  Filled 2011-07-04 (×5): qty 1

## 2011-07-04 MED ORDER — SODIUM CHLORIDE 0.9 % IJ SOLN
3.0000 mL | Freq: Two times a day (BID) | INTRAMUSCULAR | Status: DC
  Administered 2011-07-05 – 2011-07-06 (×3): 3 mL via INTRAVENOUS

## 2011-07-04 MED ORDER — DOCUSATE SODIUM 100 MG PO CAPS
100.0000 mg | ORAL_CAPSULE | Freq: Two times a day (BID) | ORAL | Status: DC
  Administered 2011-07-05 – 2011-07-06 (×4): 100 mg via ORAL
  Filled 2011-07-04 (×5): qty 1

## 2011-07-04 MED ORDER — ISOSORBIDE MONONITRATE ER 30 MG PO TB24
30.0000 mg | ORAL_TABLET | Freq: Every day | ORAL | Status: DC
  Administered 2011-07-05 – 2011-07-06 (×2): 30 mg via ORAL
  Filled 2011-07-04 (×2): qty 1

## 2011-07-04 NOTE — ED Notes (Signed)
Pt c/o chest pain intermittently since this am. Pt states he took 2 antiacids and his chest felt better.

## 2011-07-04 NOTE — ED Notes (Signed)
CRITICAL VALUE ALERT  Critical value received:  CKMB 20.9  Date of notification:  07-04-11  Time of notification:  11:12  Critical value read back:yes  Nurse who received alert:  Alfredo Martinez RN  MD notified (1st page):  Manuela Neptune  Time of first page:  11:13  MD notified (2nd page):  Time of second page:  Responding MD:  Manuela Neptune  Time MD responded:  11:13

## 2011-07-04 NOTE — ED Provider Notes (Addendum)
I saw and evaluated the patient, reviewed the resident's note and I agree with the findings and plan.   .Face to face Exam:  General:  Awake HEENT:  Atraumatic Resp:  Normal effort Abd:  Nondistended Neuro:No focal weakness Lymph: No adenopathy      Date: 07/04/2011  Rate: 79  Rhythm: normal sinus rhythm  QRS Axis: normal  Intervals: normal  ST/T Wave abnormalities: normal  Conduction Disutrbances:none:   Old EKG Reviewed: changes noted       Nelia Shi, MD 07/04/11 1322  Nelia Shi, MD 07/04/11 1525

## 2011-07-04 NOTE — ED Notes (Signed)
Spoke with Dr. Radford Pax about giving pt ASA. Pt took 1/2 of 325mg  today prior to coming to ER.

## 2011-07-04 NOTE — ED Notes (Signed)
Waiting for transportation to [facility]. [facility] notified of pt needing transport.

## 2011-07-04 NOTE — ED Notes (Signed)
Report given to [employer] with [facility] and report given to [facility] on 3700 at [facility].

## 2011-07-04 NOTE — ED Notes (Signed)
Waiting in bed for transport to [facility]. Denies any cp at this time. Pt notified family of transfer.

## 2011-07-04 NOTE — ED Notes (Signed)
Dr. Radford Pax asked if [facility] could take pt to [facility]- yes per MD. EMS called for transport. Will send when truck available.

## 2011-07-04 NOTE — ED Notes (Signed)
No new orders received at this time for critical lab of CKMB

## 2011-07-04 NOTE — ED Notes (Signed)
Pt given meal tray. Resting in bed with nad. Denies any pain at this time. Pt waiting for transportation to [facility] via [facility]. Will continue to monitor.

## 2011-07-04 NOTE — ED Notes (Signed)
[facility] here at this time. 

## 2011-07-04 NOTE — ED Provider Notes (Signed)
History     CSN: [id number]  Arrival date & time 07/04/11  1003   First MD Initiated Contact with Patient 07/04/11 1020      Chief Complaint  Patient presents with  . Chest Pain   HPI pt is an 77 yo m with multiple cardiac risk factors who has had several episodes of central to left sided chest pain this AM.  Pain was not related to activity, did not have any radiation, was not accompanied by any SOB/diaphoresis, and was relieved by antiacids.  Pain occurred several times throughout the morning, beginning at 7am, and lasted for 3-5 min.  Pt otherwise feeling well and came to the ED on his own due to having had multiple MIs in the past, some of which occurred with similar symptoms.  Past Medical History  Diagnosis Date  . Coronary artery disease   . Diabetes mellitus   . Hypertension   . PUD (peptic ulcer disease)   . Hypercholesteremia   . Myocardial infarct, old   . GERD (gastroesophageal reflux disease)   . Arthritis     Past Surgical History  Procedure Date  . Tonsillectomy   . Post aortocoronary bypass surgery   . Coronary artery bypass graft     No family history on file.  History  Substance Use Topics  . Smoking status: Former Smoker    Types: Cigarettes  . Smokeless tobacco: Current User    Types: Chew  . Alcohol Use: No      Review of Systems  Cardiovascular: Positive for chest pain. Negative for palpitations.  All other systems reviewed and are negative.    Allergies  Review of patient's allergies indicates no known allergies.  Home Medications   Current Outpatient Rx  Name Route Sig Dispense Refill  . ASPIRIN EC 81 MG PO TBEC Oral Take 81 mg by mouth daily.      Marland Kitchen PEPTO-BISMOL PO Oral Take by mouth daily as needed. Patient states that he just takes a couple of swallows when needed to control bowels.     Marland Kitchen CARVEDILOL 6.25 MG PO TABS Oral Take 6.25 mg by mouth 2 (two) times daily with a meal.     . CLOPIDOGREL BISULFATE 75 MG PO TABS Oral Take 1  tablet (75 mg total) by mouth daily. 30 tablet 2  . COD LIVER OIL PO Oral Take 1 capsule by mouth daily.      Marland Kitchen DOCUSATE SODIUM 100 MG PO TABS Oral Take 100 mg by mouth daily as needed. Constipation     . ISOSORBIDE MONONITRATE ER 30 MG PO TB24  Take 1/2 tablet daily 30 tablet 6  . LORATADINE 10 MG PO TABS Oral Take 10 mg by mouth daily.      Marland Kitchen METFORMIN HCL 500 MG PO TABS Oral Take 500 mg by mouth 2 (two) times daily.     . ADULT MULTIVITAMIN W/MINERALS CH Oral Take 1 tablet by mouth daily.      Marland Kitchen PANTOPRAZOLE SODIUM 40 MG PO TBEC Oral Take 40 mg by mouth daily.      Marland Kitchen RAMIPRIL 5 MG PO CAPS Oral Take 5 mg by mouth daily.      Marland Kitchen ROSUVASTATIN CALCIUM 10 MG PO TABS Oral Take 10 mg by mouth daily.      Marland Kitchen SAXAGLIPTIN HCL 5 MG PO TABS Oral Take 5 mg by mouth daily.      Marland Kitchen TAMSULOSIN HCL 0.4 MG PO CAPS Oral Take 0.4 mg by mouth at  bedtime.     Marland Kitchen NITROGLYCERIN 0.4 MG SL SUBL Sublingual Place 0.4 mg under the tongue every 5 (five) minutes as needed. Chest pain/patient said he has never had to use medication as he takes Rolaids if he gets chest pain and that stops pain       BP 114/62  Pulse 77  Temp(Src) 97.9 F (36.6 C) (Oral)  Resp 14  Ht 5\' 5"  (1.651 m)  Wt 188 lb (85.276 kg)  BMI 31.28 kg/m2  SpO2 97%  Physical Exam  Constitutional: He is oriented to person, place, and time. He appears well-developed and well-nourished. No distress.  HENT:  Head: Normocephalic and atraumatic.  Right Ear: External ear normal.  Left Ear: External ear normal.  Nose: Nose normal.  Mouth/Throat: Oropharynx is clear and moist.  Eyes: Conjunctivae and EOM are normal.  Neck: Normal range of motion. Neck supple.  Cardiovascular: Normal rate, regular rhythm and normal heart sounds.   No murmur heard. Pulmonary/Chest: Effort normal and breath sounds normal. He has no wheezes.  Abdominal: Soft. Bowel sounds are normal. He exhibits no distension. There is no tenderness.  Musculoskeletal: Normal range of motion.  He exhibits no edema.  Lymphadenopathy:    He has no cervical adenopathy.  Neurological: He is alert and oriented to person, place, and time. No cranial nerve deficit.  Skin: Skin is warm and dry.  Psychiatric: He has a normal mood and affect.    ED Course  Procedures (including critical care time)  Labs Reviewed  BASIC METABOLIC PANEL - Abnormal; Notable for the following:    Sodium 134 (*)    Glucose, Bld 173 (*)    GFR calc non Af Amer 79 (*)    All other components within normal limits  CARDIAC PANEL(CRET KIN+CKTOT+MB+TROPI) - Abnormal; Notable for the following:    Total CK 646 (*)    CK, MB 20.9 (*)    Relative Index 3.2 (*)    All other components within normal limits  CBC   No results found.   No diagnosis found.    MDM  Pt high risk cardiac patient.  Despite negative troponin and normal EKG feel that he needs a cardiac rule out.  Will request admission.        Majel Homer, MD 07/04/11 [phone]

## 2011-07-04 NOTE — H&P (Addendum)
PCP:   [name], MD, MD  PRIMARY CARDIOLOGIST:  Gerrit Friends. Dietrich Pates, MD, [facility]  Chief Complaint:  Chest pain  HPI: 12yoM with h/o CAD s/p CABG, s/p stenting to distal LCx, s/p stent to proximal SVG-LCx in 05/2010, EF 60%,   HTN/HL/DM, and recent NSTEMI in 04/2011. Cath that admission showed patent LIMA-LAD, SVG-RCA known  occluded, SVG-OM1-OM2 was patent to OM1 but stented limb to OM2 totally occluded, but with collaterals from  OM1 to OM2. Medically treated, there were no PCI targets.   Pt was last seen in office visit 05/2011 and started on Imdur 15 mg daily for chest discomfort, thought possibly  GERD.   He is a fair historian at best, but relates that he awoke this morning and had his usual eggs, toast, and coffee. Then  about 1 hour after, he developed high substernal chest pain that is poorly described, but no radiation or any other  associated symptoms. He took a rolaids. It lasted on the order of a few minutes, then went away, but came back 30  mins later. He didn't want to come to the hospital, so went to [facility] instead to measure his BP which was  138/60's, and pulse 84. He left [employer], drove home, and had 3rd recurrence of chest pressure, so went to [facility].   At [facility] ED, vitals were stable. Chem panel with Na 134, renal 12/0.88, otherwise normal. Total CK 646, MB 20.9, with  relative index 3.2%, however Trop negative x1. CBC normal.   There has been the question of whether this is GERD for which he has been taking what he describes as likely a PPI  and possibly another medicine but he is not specific. It is worse in the mornings, before and after meals. He doesn't  exert himself much, but denies angina with walking in his house. He doesn't go up stairs or exert himself any furhter.  However, he hasn't been having much GERD/chest pain for the past couple months. ROS as above, o/w negative.    Past Medical History  Diagnosis Date  . Coronary artery disease     s/p CABG, s/p stenting to distal LCx, s/p stent to proximal SVG-LCx in 05/2010,  NSTEMI in 04/2011 with cath showing occluded OM2 graft   . Diabetes mellitus   . Hypertension   . PUD (peptic ulcer disease)   . Hypercholesteremia   . Myocardial infarct, old   . GERD (gastroesophageal reflux disease)   . Arthritis     Past Surgical History  Procedure Date  . Tonsillectomy   . Post aortocoronary bypass surgery   . Coronary artery bypass graft     Medications:  HOME MEDS:  Poorly reconciled by pt  Prior to Admission medications   Medication Sig Start Date End Date Taking? Authorizing Provider  aspirin EC 81 MG tablet Take 81 mg by mouth daily.     Yes Historical Provider, MD  Bismuth Subsalicylate (PEPTO-BISMOL PO) Take by mouth daily as needed. Patient states that he just takes a couple of swallows when needed to control bowels.    Yes Historical Provider, MD  carvedilol (COREG) 6.25 MG tablet Take 6.25 mg by mouth 2 (two) times daily with a meal.    Yes Historical Provider, MD  clopidogrel (PLAVIX) 75 MG tablet Take 1 tablet (75 mg total) by mouth daily. 06/04/11  Yes Gerrit Friends. Rothbart, MD  COD LIVER OIL PO Take 1 capsule by mouth daily.     Yes Historical  Provider, MD  Docusate Sodium (STOOL SOFTENER) 100 MG capsule Take 100 mg by mouth daily as needed. Constipation    Yes Historical Provider, MD  isosorbide mononitrate (IMDUR) 30 MG 24 hr tablet Take 1/2 tablet daily 05/28/11  Yes Joni Reining, NP  loratadine (CLARITIN) 10 MG tablet Take 10 mg by mouth daily.     Yes Historical Provider, MD  metFORMIN (GLUCOPHAGE) 500 MG tablet Take 500 mg by mouth 2 (two) times daily.    Yes Historical Provider, MD  Multiple Vitamin (MULITIVITAMIN WITH MINERALS) TABS Take 1 tablet by mouth daily.     Yes Historical Provider, MD  pantoprazole (PROTONIX) 40 MG tablet Take 40 mg by mouth daily.     Yes Historical Provider, MD  ramipril (ALTACE) 5 MG capsule Take 5 mg by mouth daily.     Yes  Historical Provider, MD  rosuvastatin (CRESTOR) 10 MG tablet Take 10 mg by mouth daily.     Yes Historical Provider, MD  saxagliptin HCl (ONGLYZA) 5 MG TABS tablet Take 5 mg by mouth daily.     Yes Historical Provider, MD  Tamsulosin HCl (FLOMAX) 0.4 MG CAPS Take 0.4 mg by mouth at bedtime.    Yes Historical Provider, MD  nitroGLYCERIN (NITROSTAT) 0.4 MG SL tablet Place 0.4 mg under the tongue every 5 (five) minutes as needed. Chest pain/patient said he has never had to use medication as he takes Rolaids if he gets chest pain and that stops pain     Historical Provider, MD    Allergies:  No Known Allergies  Social History:  Lives alone, has a daughter. Quit smoking many years ago   reports that he has quit smoking. His smoking use included Cigarettes. His smokeless tobacco use includes Chew. He reports that he does not drink alcohol or use illicit drugs.  Family History: Non contributory   Physical Exam: Filed Vitals:   07/04/11 1600 07/04/11 1630 07/04/11 1700 07/04/11 1841  BP: 115/62 113/65 113/61 152/85  Pulse: 84 80 79 73  Temp:    97.5 F (36.4 C)  TempSrc:    Oral  Resp: 17 17 16 18   Height:    5\' 5"  (1.651 m)  Weight:    81 kg (178 lb 9.2 oz)  SpO2: 96% 94% 94% 98%   Blood pressure 152/85, pulse 73, temperature 97.5 F (36.4 C), temperature source Oral, resp. rate 18, height 5\' 5"  (1.651 m), weight 81 kg (178 lb 9.2 oz), SpO2 98.00%. Gen: Elderly M in no distress, pleasant, rambles on a bit, no cardiopulmonary distress, appears well HEENT: PERRL, EOMI, sclera clear and normal appearing, mouth and tongue on the dry side but no gross lesions.  Neck: Supple, normal appearing Lungs: Fair air movement without gross adventitious lung sounds Heart: RRR with very soft and faint S1/2 but no gross murmurs, gallops Abd: Soft, NT ND, benign, not obese Extrem: Soft, NT ND, benign appearing, no BLE edema, warm and perfusing Neuro: Normal, grossly non-focal. On arrival, pt is standing  at bedside table, gets back into bed without assistance,  moves arms spontaneously.    Labs & Imaging Results for orders placed during the hospital encounter of 07/04/11 (from the past 48 hour(s))  CBC     Status: Normal   Collection Time   07/04/11 10:33 AM      Component Value Range Comment   WBC 8.2  4.0 - 10.5 (K/uL)    RBC 4.25  4.22 - 5.81 (MIL/uL)    Hemoglobin  13.3  13.0 - 17.0 (g/dL)    HCT 40.9  81.1 - 91.4 (%)    MCV 91.8  78.0 - 100.0 (fL)    MCH 31.3  26.0 - 34.0 (pg)    MCHC 34.1  30.0 - 36.0 (g/dL)    RDW 78.2  95.6 - 21.3 (%)    Platelets 168  150 - 400 (K/uL)   BASIC METABOLIC PANEL     Status: Abnormal   Collection Time   07/04/11 10:33 AM      Component Value Range Comment   Sodium 134 (*) 135 - 145 (mEq/L)    Potassium 4.4  3.5 - 5.1 (mEq/L)    Chloride 100  96 - 112 (mEq/L)    CO2 25  19 - 32 (mEq/L)    Glucose, Bld 173 (*) 70 - 99 (mg/dL)    BUN 12  6 - 23 (mg/dL)    Creatinine, Ser 0.86  0.50 - 1.35 (mg/dL)    Calcium 9.9  8.4 - 10.5 (mg/dL)    GFR calc non Af Amer 79 (*) >90 (mL/min)    GFR calc Af Amer >90  >90 (mL/min)   CARDIAC PANEL(CRET KIN+CKTOT+MB+TROPI)     Status: Abnormal   Collection Time   07/04/11 10:33 AM      Component Value Range Comment   Total CK 646 (*) 7 - 232 (U/L)    CK, MB 20.9 (*) 0.3 - 4.0 (ng/mL)    Troponin I <0.30  <0.30 (ng/mL)    Relative Index 3.2 (*) 0.0 - 2.5    GLUCOSE, CAPILLARY     Status: Abnormal   Collection Time   07/04/11  5:02 PM      Component Value Range Comment   Glucose-Capillary 186 (*) 70 - 99 (mg/dL)    Comment 1 Documented in Chart      Comment 2 Notify RN     GLUCOSE, CAPILLARY     Status: Abnormal   Collection Time   07/04/11  6:28 PM      Component Value Range Comment   Glucose-Capillary 185 (*) 70 - 99 (mg/dL)   GLUCOSE, CAPILLARY     Status: Abnormal   Collection Time   07/04/11  9:01 PM      Component Value Range Comment   Glucose-Capillary 220 (*) 70 - 99 (mg/dL)    No results  found.  ECG: NSR 69 bpm, normal axis, normal intervals, good RWP, no ST segment deviations, T waves normal. Overall  non-ischemic and normal ECG.   Impression Present on Admission:  .Chest pain .HTN (hypertension) .Elevated CPK .DM .HYPERLIPIDEMIA, MIXED .GERD  82yoM with h/o CAD s/p CABG, s/p stenting to distal LCx, s/p stent to proximal SVG-LCx in 05/2010, EF 60%,   HTN/HL/DM, and recent NSTEMI in 04/2011 with cath now showing occluded OM2 graft, with collateralization  1. CAD s/p CABG and NSTEMI 04/2011: He is high risk patient with somewhat atypical symptoms, and no  documented evidence of cardiac ischemia at present. He does have elevated CK with positive MB but appears to  chronically have elevated CK that is longstanding, and therefore I tend to doubt this is due to true ACS; Trop is  negative and ECG is unremarkable. Presently chest pain free.   - Trend enzymes, trend ECG, cardiology consultation has been done, appreciated.  - Continue home ACEi, Plavix, imdur, crestor, coreg - Increase ASA to 325 for now  - Continue home PPI, will add GI cocktail  2. Diabetes: holding  home oral hypoglycemics, will cover with SSI while admitted 3. BPH: Continue home Flomax   Full code, discussed with pt  Telemetry, [facility] team 5  Other plans as per orders.  Tadao Emig 07/04/2011, 10:02 PM   Addendum: Cards consult appreciated -- decreased ASA back to 81 daily

## 2011-07-05 DIAGNOSIS — R079 Chest pain, unspecified: Secondary | ICD-10-CM

## 2011-07-05 LAB — CBC
MCH: 31.1 pg (ref 26.0–34.0)
MCHC: 34.3 g/dL (ref 30.0–36.0)
MCV: 90.7 fL (ref 78.0–100.0)
Platelets: 142 10*3/uL — ABNORMAL LOW (ref 150–400)
RDW: 12.8 % (ref 11.5–15.5)

## 2011-07-05 LAB — CARDIAC PANEL(CRET KIN+CKTOT+MB+TROPI)
CK, MB: 13.2 ng/mL (ref 0.3–4.0)
Relative Index: 3.1 — ABNORMAL HIGH (ref 0.0–2.5)
Relative Index: 3.3 — ABNORMAL HIGH (ref 0.0–2.5)
Total CK: 428 U/L — ABNORMAL HIGH (ref 7–232)
Total CK: 416 U/L — ABNORMAL HIGH (ref 7–232)

## 2011-07-05 LAB — GLUCOSE, CAPILLARY: Glucose-Capillary: 214 mg/dL — ABNORMAL HIGH (ref 70–99)

## 2011-07-05 LAB — BASIC METABOLIC PANEL
CO2: 25 mEq/L (ref 19–32)
Calcium: 9.6 mg/dL (ref 8.4–10.5)
Creatinine, Ser: 0.85 mg/dL (ref 0.50–1.35)
Glucose, Bld: 214 mg/dL — ABNORMAL HIGH (ref 70–99)

## 2011-07-05 MED ORDER — ASPIRIN 81 MG PO CHEW
81.0000 mg | CHEWABLE_TABLET | Freq: Every day | ORAL | Status: DC
  Administered 2011-07-05 – 2011-07-06 (×2): 81 mg via ORAL
  Filled 2011-07-05 (×2): qty 1

## 2011-07-05 NOTE — Progress Notes (Signed)
Pt had critical lab value for CKMB of 13.2, informed M. Lynch of value no further action needed.

## 2011-07-05 NOTE — Consult Note (Signed)
Primary Cardiologist: Dr. [place] Bing Corinda [place]ub[place])  Reason for Consult: chest pain  Referring Physician: [facility]  Patient location: Room 3712   HPI: Jeffrey [name] is an 77 year old gentleman with coronary artery disease requiring surgical bypass grafting in the remote past (LIMA-LAD, SVG-RCA, SVG-OM1-OM2), recent NSTEMI in 04/2011 subsequently managed medically, chronically elevated CK, diabetes, and GERD who presented to the [facility] ED earlier today with after episodes of central chest pain.  He associates his episodes of chest pain today with meals, starting with breakfast. He has not had any associated nausea or diaphoresis. The pain was non-radiating and lasted for approximately 5 minutes per episode. It was not pleuritic in nature. He denies dyspnea. He took an antacid (Rolaids) which provided him with relief. The discomfort recurred later in the day and, out of his own concern for his past coronary history, he decided to seek medical attention for further evaluation.   He reports compliance with his medication regimen, including his dual-antiplatelet therapy. During his last admission in October 2012, he underwent coronary angiography which revealed: patient LIMA-LAD, chronically occluded SVG-RCA (right to right collaterals), patient SVG -OM1 with a an occlusion in the limb then going to the OM2 (with left to left collaterals). Medical management was pursued.    Past Medical History  Diagnosis Date  . Coronary artery disease     s/p CABG, s/p stenting to distal LCx, s/p stent to proximal SVG-LCx in 05/2010,  NSTEMI in 04/2011 with cath showing occluded OM2 graft   . Diabetes mellitus   . Hypertension   . PUD (peptic ulcer disease)   . Hypercholesteremia   . Myocardial infarct, old   . GERD (gastroesophageal reflux disease)   . Arthritis     Past Surgical History  Procedure Date  . Tonsillectomy   . Post aortocoronary bypass surgery   . Coronary artery bypass  graft     Social History:  Remote tobacco use (cigarrettes)  Allergies: No Known Allergies  Medications: I have reviewed the patient's current medications. ASA 81mg  Clopidigrel 75mg  Carvedilol 6.25mg  BID Ramipril 5mg  Isosorbide Mononitrate 30mg  Rosuvastatin 10mg  Pantoprazole 40mg   Exam: 97.4 158/77 HR 65 RR 20 Pulse Ox 97% GEN: no acute distress, breathing comfortably without need for accessory muscles NECK: supple, no evident JVD CHEST: clear to auscultation bilaterally CARDIAC: regular, S1 S2, no murmurs ABD: soft, non-tender, non-distended EXT: warm, well-perfused, no edema SKIN: warm & dry NEURO A&Ox3   Results for orders placed during the hospital encounter of 07/04/11 (from the past 48 hour(s))  CBC     Status: Normal   Collection Time   07/04/11 10:33 AM      Component Value Range Comment   WBC 8.2  4.0 - 10.5 (K/uL)    RBC 4.25  4.22 - 5.81 (MIL/uL)    Hemoglobin 13.3  13.0 - 17.0 (g/dL)    HCT 16.1  09.6 - 04.5 (%)    MCV 91.8  78.0 - 100.0 (fL)    MCH 31.3  26.0 - 34.0 (pg)    MCHC 34.1  30.0 - 36.0 (g/dL)    RDW 40.9  81.1 - 91.4 (%)    Platelets 168  150 - 400 (K/uL)   BASIC METABOLIC PANEL     Status: Abnormal   Collection Time   07/04/11 10:33 AM      Component Value Range Comment   Sodium 134 (*) 135 - 145 (mEq/L)    Potassium 4.4  3.5 - 5.1 (mEq/L)  Chloride 100  96 - 112 (mEq/L)    CO2 25  19 - 32 (mEq/L)    Glucose, Bld 173 (*) 70 - 99 (mg/dL)    BUN 12  6 - 23 (mg/dL)    Creatinine, Ser 1.61  0.50 - 1.35 (mg/dL)    Calcium 9.9  8.4 - 10.5 (mg/dL)    GFR calc non Af Amer 79 (*) >90 (mL/min)    GFR calc Af Amer >90  >90 (mL/min)   CARDIAC PANEL(CRET KIN+CKTOT+MB+TROPI)     Status: Abnormal   Collection Time   07/04/11 10:33 AM      Component Value Range Comment   Total CK 646 (*) 7 - 232 (U/L)    CK, MB 20.9 (*) 0.3 - 4.0 (ng/mL)    Troponin I <0.30  <0.30 (ng/mL)    Relative Index 3.2 (*) 0.0 - 2.5    GLUCOSE, CAPILLARY      Status: Abnormal   Collection Time   07/04/11  5:02 PM      Component Value Range Comment   Glucose-Capillary 186 (*) 70 - 99 (mg/dL)    Comment 1 Documented in Chart      Comment 2 Notify RN     GLUCOSE, CAPILLARY     Status: Abnormal   Collection Time   07/04/11  6:28 PM      Component Value Range Comment   Glucose-Capillary 185 (*) 70 - 99 (mg/dL)   GLUCOSE, CAPILLARY     Status: Abnormal   Collection Time   07/04/11  9:01 PM      Component Value Range Comment   Glucose-Capillary 220 (*) 70 - 99 (mg/dL)    ECG: normal sinus rhythm, incomplete RBBB patters, no ST segment deviation present   ROS Physical Exam  Assessment/Plan: 77 year old gentleman with extensive history of coronary artery disease and bypass graft disease with a recent NSTEMI who presents with self-limited central chest pain episodes earlier in the day with no electrocardiographic or enzymatic evidence for ischemia. Instead, given the association between his symptoms and meals, together with his response to antacid medication, I suspect his symptoms most likely stem from his upper GI tract (specifically acid reflux).   Given his burden of atherosclerotic heart disease, we will nevertheless complete a cardiac enzyme trend overnight to ensure/confirm that his atypical symptoms are not anginal-equivalents.  For now, would recommend: - Continued dual-antiplatelet therapy with ASA 81mg  and Clopidogrel 75mg . His indication for dual therapy stems from his recent NSTEMI without revascularization, rather than from recent stenting. Given the likely GI origin of his current symptoms, I think 81mg  of ASA is both sufficient and less likely to lead to worsening of his GI-based symptoms. I do not think a full dose ASA is likely to help his current cause.  - Complete cardiac enzyme curve overnight to ensure flat/stable trend - If his enzymes and ECGs continue to be reassuring into the morning, he will not need any further cardiac  testing as part of this hospitalization - Continue ACE-I, long-acting nitrate, Statin, Beta-blockade - Continued focus on potential modifications/intensification of his antacid regimen  I discussed my impressions with the patient and answered his questions to the best of my ability.  Please call with any questions or concerns.  Zacarias Pontes, MD Cardiology Fellow On-Call

## 2011-07-05 NOTE — Progress Notes (Signed)
Physical Therapy Evaluation Patient Details Name: Jeffrey [name] MRN: [id number] DOB: 1928/08/26 Today's Date: 07/05/2011  Problem List:  Patient Active Problem List  Diagnoses  . DM  . HYPERLIPIDEMIA, MIXED  . GERD  . CORONARY ARTERY BYPASS GRAFT, THREE VESSEL, HX OF  . Elevated CPK  . HTN (hypertension)  . BPH (benign prostatic hyperplasia)  . NSTEMI (non-ST elevated myocardial infarction)  . Chest pain    Past Medical History:  Past Medical History  Diagnosis Date  . Coronary artery disease     s/p CABG, s/p stenting to distal LCx, s/p stent to proximal SVG-LCx in 05/2010,  NSTEMI in 04/2011 with cath showing occluded OM2 graft   . Diabetes mellitus   . Hypertension   . PUD (peptic ulcer disease)   . Hypercholesteremia   . Myocardial infarct, old   . GERD (gastroesophageal reflux disease)   . Arthritis    Past Surgical History:  Past Surgical History  Procedure Date  . Tonsillectomy   . Post aortocoronary bypass surgery   . Coronary artery bypass graft     PT Assessment/Plan/Recommendation PT Assessment Clinical Impression Statement: Pt is an 77 y/o male admitted with chest pain.  Pt is independent with all mobility and does not require any PT follow up acutely or at d/c.  Educated pt to continue mobility with self and staff daily. PT Recommendation/Assessment: Patent does not need any further PT services No Skilled PT: All education completed;Patient at baseline level of functioning;Patient is independent with all acitivity/mobility PT Recommendation Follow Up Recommendations: None Equipment Recommended: None recommended by PT  PT Evaluation Precautions/Restrictions  Precautions Required Braces or Orthoses: No Restrictions Weight Bearing Restrictions: No Prior Functioning  Home Living Lives With: Alone Type of Home: Mobile home Home Layout: One level Home Access: Stairs to enter Entrance Stairs-Rails: Right Entrance Stairs-Number of Steps: 2 ("Ain't  no trouble at all.") Home Adaptive Equipment: None Prior Function Level of Independence: Independent with basic ADLs;Independent with homemaking with ambulation;Independent with gait;Independent with transfers Able to Take Stairs?: Reciprically Driving: Yes Cognition Cognition Arousal/Alertness: Awake/alert Overall Cognitive Status: Appears within functional limits for tasks assessed Sensation/Coordination Sensation Light Touch: Appears Intact Stereognosis: Not tested Hot/Cold: Not tested Proprioception: Not tested Coordination Gross Motor Movements are Fluid and Coordinated: Yes Fine Motor Movements are Fluid and Coordinated: Yes Extremity Assessment RUE Assessment RUE Assessment: Within Functional Limits LUE Assessment LUE Assessment: Within Functional Limits RLE Assessment RLE Assessment: Within Functional Limits LLE Assessment LLE Assessment: Within Functional Limits Pain 0/10 with evaluation. Mobility (including Balance) Bed Mobility Bed Mobility: No Transfers Transfers: Yes Sit to Stand: 7: Independent Stand to Sit: 7: Independent Ambulation/Gait Ambulation/Gait: Yes Ambulation/Gait Assistance: 7: Independent Ambulation Distance (Feet): 45 Feet Assistive device: None Gait Pattern: Within Functional Limits Gait velocity: Brisk Stairs: No ("I really can do them just fine.") Wheelchair Mobility Wheelchair Mobility: No  Posture/Postural Control Posture/Postural Control: No significant limitations Balance Balance Assessed: No End of Session PT - End of Session Activity Tolerance: Patient tolerated treatment well Patient left: in bed;with call bell in reach (Sitting EOB.) Nurse Communication: Mobility status for transfers;Mobility status for ambulation General Behavior During Session: [facility] for tasks performed Cognition: [facility] for tasks performed  Cephus Shelling 07/05/2011, 12:26 PM  07/05/2011 Cephus Shelling, PT, DPT [phone]

## 2011-07-05 NOTE — Progress Notes (Signed)
Subjective: Doing well denies any further CP or SoB  Objective: Vital signs in last 24 hours: Temp:  [97.4 F (36.3 C)-98.3 F (36.8 C)] 98.2 F (36.8 C) (12/22 0500) Pulse Rate:  [66-84] 67  (12/22 0500) Resp:  [13-20] 20  (12/22 0500) BP: (113-176)/(61-85) 176/80 mmHg (12/22 0500) SpO2:  [94 %-98 %] 98 % (12/22 0500) Weight:  [80.3 kg (177 lb 0.5 oz)-85.276 kg (188 lb)] 177 lb 0.5 oz (80.3 kg) (12/22 0500) Weight change:  Last BM Date: 07/04/11  Intake/Output from previous day: 12/21 0701 - 12/22 0700 In: -  Out: 800 [Urine:800]   Physical Exam: General: Alert, awake, oriented x3, in no acute distress. HEENT: No bruits, no goiter. Heart: Regular rate and rhythm, without murmurs, rubs, gallops. Lungs: Clear to auscultation bilaterally. Abdomen: Soft, nontender, nondistended, positive bowel sounds. Extremities: No clubbing cyanosis or edema with positive pedal pulses. Neuro: Grossly intact, nonfocal.  Lab Results: Basic Metabolic Panel:  Basename 07/05/11 0600 07/04/11 1033  NA 136 134*  K 3.9 4.4  CL 102 100  CO2 25 25  GLUCOSE 214* 173*  BUN 11 12  CREATININE 0.85 0.88  CALCIUM 9.6 9.9  MG -- --  PHOS -- --   Liver Function Tests: No results found for this basename: AST:2,ALT:2,ALKPHOS:2,BILITOT:2,PROT:2,ALBUMIN:2 in the last 72 hours No results found for this basename: LIPASE:2,AMYLASE:2 in the last 72 hours No results found for this basename: AMMONIA:2 in the last 72 hours CBC:  Basename 07/05/11 0600 07/04/11 1033  WBC 6.1 8.2  NEUTROABS -- --  HGB 13.0 13.3  HCT 37.9* 39.0  MCV 90.7 91.8  PLT 142* 168   Cardiac Enzymes:  Basename 07/05/11 0045 07/04/11 1033  CKTOTAL 428* 646*  CKMB 13.2* 20.9*  CKMBINDEX -- --  TROPONINI <0.30 <0.30   BNP: No components found with this basename: POCBNP:3 D-Dimer: No results found for this basename: DDIMER:2 in the last 72 hours CBG:  Basename 07/05/11 0747 07/04/11 2101 07/04/11 1828 07/04/11 1702    GLUCAP 191* 220* 185* 186*   Hemoglobin A1C: No results found for this basename: HGBA1C in the last 72 hours Fasting Lipid Panel: No results found for this basename: CHOL,HDL,LDLCALC,TRIG,CHOLHDL,LDLDIRECT in the last 72 hours Thyroid Function Tests: No results found for this basename: TSH,T4TOTAL,FREET4,T3FREE,THYROIDAB in the last 72 hours Anemia Panel: No results found for this basename: VITAMINB12,FOLATE,FERRITIN,TIBC,IRON,RETICCTPCT in the last 72 hours Coagulation: No results found for this basename: LABPROT:2,INR:2 in the last 72 hours Urine Drug Screen: Drugs of Abuse  No results found for this basename: labopia, cocainscrnur, labbenz, amphetmu, thcu, labbarb    Alcohol Level: No results found for this basename: ETH:2 in the last 72 hours  No results found for this or any previous visit (from the past 240 hour(s)).  Studies/Results: No results found.  Medications: Scheduled Meds:   . aspirin  162 mg Oral Once  . aspirin  81 mg Oral Daily  . carvedilol  6.25 mg Oral BID WC  . clopidogrel  75 mg Oral Daily  . docusate sodium  100 mg Oral BID  . heparin  5,000 Units Subcutaneous Q8H  . insulin aspart  0-15 Units Subcutaneous TID WC  . isosorbide mononitrate  30 mg Oral Daily  . pantoprazole  40 mg Oral Daily  . ramipril  5 mg Oral Daily  . rosuvastatin  10 mg Oral Daily  . senna  1 tablet Oral BID  . sodium chloride  3 mL Intravenous Q12H  . Tamsulosin HCl  0.4 mg  Oral QHS  . DISCONTD: aspirin  325 mg Oral Daily   Continuous Infusions:  PRN Meds:.sodium chloride, acetaminophen, acetaminophen, alum & mag hydroxide-simeth, gi cocktail, ondansetron (ZOFRAN) IV, ondansetron, sodium chloride, DISCONTD: ondansetron (ZOFRAN) IV  Assessment/Plan:  1.Chest pain: atypical but similar to previous episodes of MI, now resolved, EKG normal, CKMB elevated higher than baseline, recent NSTEMI in October/cath 10/12 showed patent grafts but occluded stented limb of OM2 Continue  current meds, await cardiology input whether any changes/work up is warranted 2. DM: metformin on hold, continue SSI 3. HYPERLIPIDEMIA, MIXED 4. GERD: increase Protonix to BID 5. CORONARY ARTERY BYPASS GRAFT, THREE VESSEL, HX OF 6. HTN: has not received am meds yet    LOS: 1 day   [facility] Pager: [phone] 07/05/2011, 8:10 AM

## 2011-07-05 NOTE — Progress Notes (Signed)
@   Subjective:  Denies CP or dyspnea   Objective:  Filed Vitals:   07/04/11 1700 07/04/11 1841 07/04/11 2200 07/05/11 0500  BP: 113/61 152/85 158/77 176/80  Pulse: 79 73 66 67  Temp:  97.5 F (36.4 C) 97.4 F (36.3 C) 98.2 F (36.8 C)  TempSrc:  Oral    Resp: 16 18 20 20   Height:  5\' 5"  (1.651 m)    Weight:  178 lb 9.2 oz (81 kg)  177 lb 0.5 oz (80.3 kg)  SpO2: 94% 98% 97% 98%    Intake/Output from previous day:  Intake/Output Summary (Last 24 hours) at 07/05/11 1610 Last data filed at 07/05/11 0600  Gross per 24 hour  Intake      0 ml  Output    800 ml  Net   -800 ml    Physical Exam: Physical exam: Well-developed well-nourished in no acute distress.  Skin is warm and dry.  HEENT is normal.  Neck is supple. No thyromegaly.  Chest is clear to auscultation with normal expansion.  Cardiovascular exam is regular rate and rhythm.  Abdominal exam nontender or distended. No masses palpated. Extremities show no edema. neuro grossly intact    Lab Results: Basic Metabolic Panel:  Basename 07/05/11 0600 07/04/11 1033  NA 136 134*  K 3.9 4.4  CL 102 100  CO2 25 25  GLUCOSE 214* 173*  BUN 11 12  CREATININE 0.85 0.88  CALCIUM 9.6 9.9  MG -- --  PHOS -- --   CBC:  Basename 07/05/11 0600 07/04/11 1033  WBC 6.1 8.2  NEUTROABS -- --  HGB 13.0 13.3  HCT 37.9* 39.0  MCV 90.7 91.8  PLT 142* 168   Cardiac Enzymes:  Basename 07/05/11 0045 07/04/11 1033  CKTOTAL 428* 646*  CKMB 13.2* 20.9*  CKMBINDEX -- --  TROPONINI <0.30 <0.30     Assessment/Plan:  1) Chest pain atypical; enzymes neg; ECG without ST changes. Recent cath and medical therapy recommended. May have been GI etiology. Ok to dc from cardiac standpoint and FU with Dr. Dietrich Pates in [place]. 2) CAD - continue preadmission meds. 3) Hypertension - continue present meds We will sign off. Please call with questions.  Olga Millers 07/05/2011, 9:06 AM

## 2011-07-06 LAB — GLUCOSE, CAPILLARY
Glucose-Capillary: 209 mg/dL — ABNORMAL HIGH (ref 70–99)
Glucose-Capillary: 266 mg/dL — ABNORMAL HIGH (ref 70–99)

## 2011-07-06 NOTE — Discharge Summary (Signed)
Physician Discharge Summary  Patient ID: Jeffrey [name] MRN: [id number] DOB/AGE: 10-29-1928 77 y.o.  Admit date: 07/04/2011 Discharge date: 07/06/2011  Primary Care Physician:  [name], MD, MD   Discharge Diagnoses:    1. Chest pain atypical  2. DM  3. HYPERLIPIDEMIA, MIXED  4. GERD  5. CAD s/p CABG and PCI  6. Elevated CPK  7. HTN (hypertension)   Current Discharge Medication List    CONTINUE these medications which have NOT CHANGED   Details  aspirin EC 81 MG tablet Take 81 mg by mouth daily.      Bismuth Subsalicylate (PEPTO-BISMOL PO) Take by mouth daily as needed. Patient states that he just takes a couple of swallows when needed to control bowels.     carvedilol (COREG) 6.25 MG tablet Take 6.25 mg by mouth 2 (two) times daily with a meal.     clopidogrel (PLAVIX) 75 MG tablet Take 1 tablet (75 mg total) by mouth daily. Qty: 30 tablet, Refills: 2    COD LIVER OIL PO Take 1 capsule by mouth daily.      Docusate Sodium (STOOL SOFTENER) 100 MG capsule Take 100 mg by mouth daily as needed. Constipation     isosorbide mononitrate (IMDUR) 30 MG 24 hr tablet Take 1/2 tablet daily Qty: 30 tablet, Refills: 6    loratadine (CLARITIN) 10 MG tablet Take 10 mg by mouth daily.      metFORMIN (GLUCOPHAGE) 500 MG tablet Take 500 mg by mouth 2 (two) times daily.     Multiple Vitamin (MULITIVITAMIN WITH MINERALS) TABS Take 1 tablet by mouth daily.      pantoprazole (PROTONIX) 40 MG tablet Take 40 mg by mouth daily.      ramipril (ALTACE) 5 MG capsule Take 5 mg by mouth daily.      rosuvastatin (CRESTOR) 10 MG tablet Take 10 mg by mouth daily.      saxagliptin HCl (ONGLYZA) 5 MG TABS tablet Take 5 mg by mouth daily.      Tamsulosin HCl (FLOMAX) 0.4 MG CAPS Take 0.4 mg by mouth at bedtime.     nitroGLYCERIN (NITROSTAT) 0.4 MG SL tablet Place 0.4 mg under the tongue every 5 (five) minutes as needed. Chest pain/patient said he has never had to use medication as he  takes Rolaids if he gets chest pain and that stops pain       STOP taking these medications     aspirin 81 MG tablet        Disposition and Follow-up:  Dr.Rothbart in 1 week  Consults: Dr.Crenshaw with [place] cardiology  Brief H and P: [name] is an 82/M with CAD/CABG, recent NSTEMI presented to [facility]  With multiple episodes of chest pain  Hospital Course:  1.Chest pain: he was admitted to the cardiac floor, his EKG was unremarkable and cardiac enzymes was remarkable only for an elevated CKMB, which is some what elevated at baseline, subsequently seen by [facility] cardiology who recommended to continue medical management, didn't quite feel that his symptoms were cardiac in origin. His last cath in October at the time of his NSTEMI showed patent grafts and occluded OM2, and medical management was recommended at the time. His symptoms have resolved by the time of admission and is discharged home in a stable condition to follow up with Dr.Rothbart.   Time spent on Discharge:  Signed: Romelle Reiley [facility] Pager: [phone] 07/06/2011, 9:59 AM

## 2011-07-06 NOTE — Progress Notes (Signed)
Discharge review done with patient.   Patient acknowledged understanding of information provided. Patient is stable and discharged home with daughter.  Jeffrey [name]

## 2011-07-30 ENCOUNTER — Other Ambulatory Visit: Payer: Self-pay

## 2011-07-30 ENCOUNTER — Emergency Department (HOSPITAL_COMMUNITY): Payer: Medicare Other

## 2011-07-30 ENCOUNTER — Emergency Department (HOSPITAL_COMMUNITY)
Admission: EM | Admit: 2011-07-30 | Discharge: 2011-07-30 | Disposition: A | Discharge status: Home / Self Care | Payer: Medicare Other | Attending: Emergency Medicine | Admitting: Emergency Medicine

## 2011-07-30 ENCOUNTER — Encounter (HOSPITAL_COMMUNITY): Payer: Self-pay | Admitting: *Deleted

## 2011-07-30 DIAGNOSIS — Z951 Presence of aortocoronary bypass graft: Secondary | ICD-10-CM | POA: Insufficient documentation

## 2011-07-30 DIAGNOSIS — Z87891 Personal history of nicotine dependence: Secondary | ICD-10-CM | POA: Insufficient documentation

## 2011-07-30 DIAGNOSIS — Z7982 Long term (current) use of aspirin: Secondary | ICD-10-CM | POA: Insufficient documentation

## 2011-07-30 DIAGNOSIS — I2510 Atherosclerotic heart disease of native coronary artery without angina pectoris: Secondary | ICD-10-CM | POA: Insufficient documentation

## 2011-07-30 DIAGNOSIS — R071 Chest pain on breathing: Secondary | ICD-10-CM | POA: Insufficient documentation

## 2011-07-30 DIAGNOSIS — I10 Essential (primary) hypertension: Secondary | ICD-10-CM | POA: Insufficient documentation

## 2011-07-30 DIAGNOSIS — K279 Peptic ulcer, site unspecified, unspecified as acute or chronic, without hemorrhage or perforation: Secondary | ICD-10-CM | POA: Insufficient documentation

## 2011-07-30 DIAGNOSIS — K219 Gastro-esophageal reflux disease without esophagitis: Secondary | ICD-10-CM | POA: Insufficient documentation

## 2011-07-30 DIAGNOSIS — R0789 Other chest pain: Secondary | ICD-10-CM

## 2011-07-30 DIAGNOSIS — M129 Arthropathy, unspecified: Secondary | ICD-10-CM | POA: Insufficient documentation

## 2011-07-30 DIAGNOSIS — E7800 Pure hypercholesterolemia, unspecified: Secondary | ICD-10-CM | POA: Insufficient documentation

## 2011-07-30 DIAGNOSIS — E119 Type 2 diabetes mellitus without complications: Secondary | ICD-10-CM | POA: Insufficient documentation

## 2011-07-30 DIAGNOSIS — I252 Old myocardial infarction: Secondary | ICD-10-CM | POA: Insufficient documentation

## 2011-07-30 LAB — DIFFERENTIAL
Lymphocytes Relative: 22 % (ref 12–46)
Lymphs Abs: 1.9 10*3/uL (ref 0.7–4.0)
Neutro Abs: 5.2 10*3/uL (ref 1.7–7.7)
Neutrophils Relative %: 60 % (ref 43–77)

## 2011-07-30 LAB — COMPREHENSIVE METABOLIC PANEL
ALT: 26 U/L (ref 0–53)
AST: 34 U/L (ref 0–37)
Alkaline Phosphatase: 58 U/L (ref 39–117)
CO2: 26 mEq/L (ref 19–32)
Chloride: 101 mEq/L (ref 96–112)
GFR calc Af Amer: >90 mL/min (ref >90)
GFR calc non Af Amer: 79 mL/min — ABNORMAL LOW (ref >90)
Glucose, Bld: 160 mg/dL — ABNORMAL HIGH (ref 70–99)
Potassium: 5 mEq/L (ref 3.5–5.1)
Sodium: 138 mEq/L (ref 135–145)

## 2011-07-30 LAB — CBC
MCV: 90.6 fL (ref 78.0–100.0)
Platelets: 168 10*3/uL (ref 150–400)
RBC: 4.46 MIL/uL (ref 4.22–5.81)
WBC: 8.6 10*3/uL (ref 4.0–10.5)

## 2011-07-30 LAB — URINALYSIS, ROUTINE W REFLEX MICROSCOPIC
Bilirubin Urine: NEGATIVE
Glucose, UA: NEGATIVE mg/dL
Hgb urine dipstick: NEGATIVE
Protein, ur: NEGATIVE mg/dL
Urobilinogen, UA: 0.2 mg/dL (ref 0.0–1.0)

## 2011-07-30 MED ORDER — SODIUM CHLORIDE 0.9 % IV SOLN
Freq: Once | INTRAVENOUS | Status: AC
  Administered 2011-07-30: 12:00:00 via INTRAVENOUS

## 2011-07-30 NOTE — ED Provider Notes (Signed)
This chart was scribed for [employer]. Colon Branch, MD by Williemae Natter. The patient was seen in room APA06/APA06 at 11:38 AM .  CSN: [id number]  Arrival date & time 07/30/11  1109   First MD Initiated Contact with Patient 07/30/11 1133      Chief Complaint  Patient presents with  . Chest Pain    (Consider location/radiation/quality/duration/timing/severity/associated sxs/prior treatment) HPI Jeffrey [name] is a 78 y.o. male with known CAD, post CABG, and stent placement x3 who presents to the Emergency Department complaining of chest pain on the right side of chest. Pt states that the pain is intermittent and started about 3 hours ago today. Pt treated with Rolaids with little to no improvement. Pt sees a cardiologist every 6 months and takes an aspirin every day. Pt denies any fever, chills, cough, runny nose, nausea, or vomiting. Pt had stents put in at [facility]. He is not in any pain at the moment.  Cardiologist: Dr. Dietrich Pates Past Medical History  Diagnosis Date  . Coronary artery disease     s/p CABG, s/p stenting to distal LCx, s/p stent to proximal SVG-LCx in 05/2010,  NSTEMI in 04/2011 with cath showing occluded OM2 graft   . Diabetes mellitus   . Hypertension   . PUD (peptic ulcer disease)   . Hypercholesteremia   . Myocardial infarct, old   . GERD (gastroesophageal reflux disease)   . Arthritis     Past Surgical History  Procedure Date  . Tonsillectomy   . Post aortocoronary bypass surgery   . Coronary artery bypass graft   . Coronary stent placement     History reviewed. No pertinent family history.  History  Substance Use Topics  . Smoking status: Former Smoker    Types: Cigarettes  . Smokeless tobacco: Current User    Types: Chew  . Alcohol Use: No      Review of Systems 10 Systems reviewed and are negative for acute change except as noted in the HPI.  Allergies  Review of patient's allergies indicates no known allergies.  Home Medications    Current Outpatient Rx  Name Route Sig Dispense Refill  . ASPIRIN EC 81 MG PO TBEC Oral Take 81 mg by mouth daily.      Marland Kitchen PEPTO-BISMOL PO Oral Take by mouth daily as needed. Patient states that he just takes a couple of swallows when needed to control bowels.     Marland Kitchen CARVEDILOL 6.25 MG PO TABS Oral Take 6.25 mg by mouth 2 (two) times daily with a meal.     . CLOPIDOGREL BISULFATE 75 MG PO TABS Oral Take 1 tablet (75 mg total) by mouth daily. 30 tablet 2  . COD LIVER OIL PO Oral Take 1 capsule by mouth daily.      Marland Kitchen DOCUSATE SODIUM 100 MG PO TABS Oral Take 100 mg by mouth daily as needed. Constipation     . ISOSORBIDE MONONITRATE ER 30 MG PO TB24  Take 1/2 tablet daily 30 tablet 6  . LORATADINE 10 MG PO TABS Oral Take 10 mg by mouth daily.      Marland Kitchen METFORMIN HCL 500 MG PO TABS Oral Take 500 mg by mouth 2 (two) times daily.     . ADULT MULTIVITAMIN W/MINERALS CH Oral Take 1 tablet by mouth daily.      Marland Kitchen NITROGLYCERIN 0.4 MG SL SUBL Sublingual Place 0.4 mg under the tongue every 5 (five) minutes as needed. Chest pain/patient said he has never had to  use medication as he takes Rolaids if he gets chest pain and that stops pain     . PANTOPRAZOLE SODIUM 40 MG PO TBEC Oral Take 40 mg by mouth daily.      Marland Kitchen RAMIPRIL 5 MG PO CAPS Oral Take 5 mg by mouth daily.      Marland Kitchen ROSUVASTATIN CALCIUM 10 MG PO TABS Oral Take 10 mg by mouth daily.      Marland Kitchen SAXAGLIPTIN HCL 5 MG PO TABS Oral Take 5 mg by mouth daily.      Marland Kitchen TAMSULOSIN HCL 0.4 MG PO CAPS Oral Take 0.4 mg by mouth at bedtime.       BP 142/80  Pulse 74  Temp(Src) 98 F (36.7 C) (Oral)  Resp 14  SpO2 96%  Physical Exam  Nursing note and vitals reviewed. Constitutional: He is oriented to person, place, and time. He appears well-developed and well-nourished.       Pain free at the time of exam  HENT:  Head: Normocephalic and atraumatic.       Edentulous   Neck: Normal range of motion. Neck supple. No JVD present.       No carotid bruits.   Cardiovascular: Normal rate, regular rhythm and normal heart sounds.   Pulmonary/Chest: Effort normal and breath sounds normal.  Abdominal: Soft. There is no tenderness.  Musculoskeletal: He exhibits no edema.  Neurological: He is alert and oriented to person, place, and time.  Skin: Skin is warm and dry.  Psychiatric: He has a normal mood and affect. His behavior is normal.    ED Course  Procedures (including critical care time)  Medications  0.9 %  sodium chloride infusion (  Intravenous New Bag/Given 07/30/11 1133)   Results for orders placed during the hospital encounter of 07/30/11  CBC      Component Value Range   WBC 8.6  4.0 - 10.5 (K/uL)   RBC 4.46  4.22 - 5.81 (MIL/uL)   Hemoglobin 13.8  13.0 - 17.0 (g/dL)   HCT 16.1  09.6 - 04.5 (%)   MCV 90.6  78.0 - 100.0 (fL)   MCH 30.9  26.0 - 34.0 (pg)   MCHC 34.2  30.0 - 36.0 (g/dL)   RDW 40.9  81.1 - 91.4 (%)   Platelets 168  150 - 400 (K/uL)  DIFFERENTIAL      Component Value Range   Neutrophils Relative 60  43 - 77 (%)   Neutro Abs 5.2  1.7 - 7.7 (K/uL)   Lymphocytes Relative 22  12 - 46 (%)   Lymphs Abs 1.9  0.7 - 4.0 (K/uL)   Monocytes Relative 11  3 - 12 (%)   Monocytes Absolute 1.0  0.1 - 1.0 (K/uL)   Eosinophils Relative 6 (*) 0 - 5 (%)   Eosinophils Absolute 0.5  0.0 - 0.7 (K/uL)   Basophils Relative 0  0 - 1 (%)   Basophils Absolute 0.0  0.0 - 0.1 (K/uL)  COMPREHENSIVE METABOLIC PANEL      Component Value Range   Sodium 138  135 - 145 (mEq/L)   Potassium 5.0  3.5 - 5.1 (mEq/L)   Chloride 101  96 - 112 (mEq/L)   CO2 26  19 - 32 (mEq/L)   Glucose, Bld 160 (*) 70 - 99 (mg/dL)   BUN 13  6 - 23 (mg/dL)   Creatinine, Ser 7.82  0.50 - 1.35 (mg/dL)   Calcium 95.6  8.4 - 10.5 (mg/dL)   Total Protein 7.2  6.0 - 8.3 (g/dL)   Albumin 3.9  3.5 - 5.2 (g/dL)   AST 34  0 - 37 (U/L)   ALT 26  0 - 53 (U/L)   Alkaline Phosphatase 58  39 - 117 (U/L)   Total Bilirubin 0.2 (*) 0.3 - 1.2 (mg/dL)   GFR calc non Af Amer 79  (*) >90 (mL/min)   GFR calc Af Amer >90  >90 (mL/min)  TROPONIN I      Component Value Range   Troponin I <0.30  <0.30 (ng/mL)  URINALYSIS, ROUTINE W REFLEX MICROSCOPIC      Component Value Range   Color, Urine YELLOW  YELLOW    APPearance CLEAR  CLEAR    Specific Gravity, Urine 1.010  1.005 - 1.030    pH 7.0  5.0 - 8.0    Glucose, UA NEGATIVE  NEGATIVE (mg/dL)   Hgb urine dipstick NEGATIVE  NEGATIVE    Bilirubin Urine NEGATIVE  NEGATIVE    Ketones, ur NEGATIVE  NEGATIVE (mg/dL)   Protein, ur NEGATIVE  NEGATIVE (mg/dL)   Urobilinogen, UA 0.2  0.0 - 1.0 (mg/dL)   Nitrite NEGATIVE  NEGATIVE    Leukocytes, UA NEGATIVE  NEGATIVE    Dg Chest Port 1 View  07/30/2011  *RADIOLOGY REPORT*  Clinical Data: Chest pain.  History of bypass surgery.  PORTABLE CHEST - 1 VIEW  Comparison: CT chest 05/23/2011 and plain films of the chest 05/17/2011.  Findings: The patient is status post CABG.  Heart size is normal. Lungs are clear.  No pneumothorax or effusion.  IMPRESSION: No acute disease.  Original Report Authenticated By: Bernadene Bell. Maricela Curet, M.D.     Date: 07/30/2011  1116  Rate:75  Rhythm: normal sinus rhythm  QRS Axis: normal  Intervals: normal  ST/T Wave abnormalities: normal  Conduction Disutrbances:none  Narrative Interpretation:   Old EKG Reviewed: none available   MDM  Patient with right sided chest pain than began 3 hours prior to arrival. Labs are unremarkable,  Troponin negative, EKG normal, chest xray without acute findings. Patient has had PO fluids and a lunch tray without any further pain. He does now recall having lifted a heavy bottle of antifreeze early this morning which may have precipitated the pain. Pt stable in ED with no significant deterioration in condition.The patient appears reasonably screened and/or stabilized for discharge and I doubt any other medical condition or other [facility] requiring further screening, evaluation, or treatment in the ED at this time prior to  discharge.   I personally performed the services described in this documentation, which was scribed in my presence. The recorded information has been reviewed and considered.  MDM Reviewed: nursing note, vitals and previous chart Reviewed previous: labs, ECG and x-ray Interpretation: labs, ECG and x-ray      [employer]. Colon Branch, MD 07/30/11 1527

## 2011-07-30 NOTE — ED Notes (Signed)
Chest pain,  X 3 hours

## 2011-08-04 ENCOUNTER — Other Ambulatory Visit: Payer: Self-pay

## 2011-08-04 ENCOUNTER — Telehealth: Payer: Self-pay | Admitting: Adult Health

## 2011-08-04 MED ORDER — ISOSORBIDE MONONITRATE ER 30 MG PO TB24: 30.0000 mg | ORAL_TABLET | Freq: Every day | ORAL | Status: DC

## 2011-08-04 NOTE — Telephone Encounter (Signed)
Per Joni Reining, NP pt. can take 30 mg of Isosorbide daily since he has been taking this dose without complaints since 11-12. Pt. And [employer] advised./LV

## 2011-08-04 NOTE — Telephone Encounter (Signed)
Patient came into office and stated he did not see the directions and has been taking one 30mg  tablet daily.  His bottle shows 1/2 tablet daily.  Needs refill called into [facility].

## 2011-10-20 ENCOUNTER — Inpatient Hospital Stay (HOSPITAL_COMMUNITY)
Admission: EM | Admit: 2011-10-20 | Discharge: 2011-10-22 | DRG: 312 | Disposition: A | Discharge status: Home / Self Care | Payer: Medicare Other | Attending: Cardiology | Admitting: Cardiology

## 2011-10-20 ENCOUNTER — Emergency Department (HOSPITAL_COMMUNITY): Payer: Medicare Other

## 2011-10-20 ENCOUNTER — Encounter (HOSPITAL_COMMUNITY): Payer: Self-pay | Admitting: Emergency Medicine

## 2011-10-20 DIAGNOSIS — R748 Abnormal levels of other serum enzymes: Secondary | ICD-10-CM

## 2011-10-20 DIAGNOSIS — K279 Peptic ulcer, site unspecified, unspecified as acute or chronic, without hemorrhage or perforation: Secondary | ICD-10-CM | POA: Diagnosis present

## 2011-10-20 DIAGNOSIS — E119 Type 2 diabetes mellitus without complications: Secondary | ICD-10-CM | POA: Diagnosis present

## 2011-10-20 DIAGNOSIS — I2510 Atherosclerotic heart disease of native coronary artery without angina pectoris: Secondary | ICD-10-CM | POA: Diagnosis present

## 2011-10-20 DIAGNOSIS — E7800 Pure hypercholesterolemia, unspecified: Secondary | ICD-10-CM | POA: Diagnosis present

## 2011-10-20 DIAGNOSIS — M1990 Unspecified osteoarthritis, unspecified site: Secondary | ICD-10-CM | POA: Diagnosis present

## 2011-10-20 DIAGNOSIS — Z8673 Personal history of transient ischemic attack (TIA), and cerebral infarction without residual deficits: Secondary | ICD-10-CM

## 2011-10-20 DIAGNOSIS — R7989 Other specified abnormal findings of blood chemistry: Secondary | ICD-10-CM

## 2011-10-20 DIAGNOSIS — K219 Gastro-esophageal reflux disease without esophagitis: Secondary | ICD-10-CM | POA: Diagnosis present

## 2011-10-20 DIAGNOSIS — R55 Syncope and collapse: Principal | ICD-10-CM

## 2011-10-20 DIAGNOSIS — Z79899 Other long term (current) drug therapy: Secondary | ICD-10-CM

## 2011-10-20 DIAGNOSIS — Z7902 Long term (current) use of antithrombotics/antiplatelets: Secondary | ICD-10-CM

## 2011-10-20 DIAGNOSIS — Z9861 Coronary angioplasty status: Secondary | ICD-10-CM

## 2011-10-20 DIAGNOSIS — I10 Essential (primary) hypertension: Secondary | ICD-10-CM | POA: Diagnosis present

## 2011-10-20 DIAGNOSIS — Z87891 Personal history of nicotine dependence: Secondary | ICD-10-CM

## 2011-10-20 DIAGNOSIS — Z951 Presence of aortocoronary bypass graft: Secondary | ICD-10-CM

## 2011-10-20 DIAGNOSIS — I252 Old myocardial infarction: Secondary | ICD-10-CM

## 2011-10-20 DIAGNOSIS — E785 Hyperlipidemia, unspecified: Secondary | ICD-10-CM | POA: Diagnosis present

## 2011-10-20 DIAGNOSIS — I214 Non-ST elevation (NSTEMI) myocardial infarction: Secondary | ICD-10-CM

## 2011-10-20 DIAGNOSIS — Z7982 Long term (current) use of aspirin: Secondary | ICD-10-CM

## 2011-10-20 HISTORY — DX: Unspecified osteoarthritis, unspecified site: M19.90

## 2011-10-20 HISTORY — DX: Cerebral infarction, unspecified: I63.9

## 2011-10-20 LAB — CBC
HCT: 35.6 % — ABNORMAL LOW (ref 39.0–52.0)
MCH: 29.9 pg (ref 26.0–34.0)
MCV: 90.4 fL (ref 78.0–100.0)
Platelets: 162 10*3/uL (ref 150–400)
RDW: 12.8 % (ref 11.5–15.5)

## 2011-10-20 LAB — DIFFERENTIAL
Basophils Relative: 0 % (ref 0–1)
Eosinophils Absolute: 0.3 10*3/uL (ref 0.0–0.7)
Eosinophils Relative: 4 % (ref 0–5)
Lymphs Abs: 1.8 10*3/uL (ref 0.7–4.0)
Monocytes Relative: 10 % (ref 3–12)
Neutrophils Relative %: 59 % (ref 43–77)

## 2011-10-20 LAB — PROTIME-INR: Prothrombin Time: 13 seconds (ref 11.6–15.2)

## 2011-10-20 LAB — BASIC METABOLIC PANEL
BUN: 17 mg/dL (ref 6–23)
Calcium: 9.7 mg/dL (ref 8.4–10.5)
Chloride: 101 mEq/L (ref 96–112)
Creatinine, Ser: 0.96 mg/dL (ref 0.50–1.35)
GFR calc Af Amer: 88 mL/min — ABNORMAL LOW (ref >90)

## 2011-10-20 LAB — POCT I-STAT TROPONIN I: Troponin i, poc: 0.16 ng/mL (ref 0.00–0.08)

## 2011-10-20 LAB — GLUCOSE, CAPILLARY: Glucose-Capillary: 144 mg/dL — ABNORMAL HIGH (ref 70–99)

## 2011-10-20 LAB — MRSA PCR SCREENING: MRSA by PCR: NEGATIVE

## 2011-10-20 LAB — APTT: aPTT: 28 seconds (ref 24–37)

## 2011-10-20 MED ORDER — NITROGLYCERIN 0.4 MG SL SUBL: 0.4000 mg | SUBLINGUAL_TABLET | SUBLINGUAL | Status: DC | PRN

## 2011-10-20 MED ORDER — HEPARIN BOLUS VIA INFUSION
4000.0000 [IU] | Freq: Once | INTRAVENOUS | Status: AC
  Administered 2011-10-20: 4000 [IU] via INTRAVENOUS

## 2011-10-20 MED ORDER — ONDANSETRON HCL 4 MG/2ML IJ SOLN
4.0000 mg | Freq: Once | INTRAMUSCULAR | Status: AC
  Administered 2011-10-20: 4 mg via INTRAVENOUS
  Filled 2011-10-20: qty 2

## 2011-10-20 MED ORDER — MORPHINE SULFATE 4 MG/ML IJ SOLN
4.0000 mg | Freq: Once | INTRAMUSCULAR | Status: AC
  Administered 2011-10-20: 4 mg via INTRAVENOUS
  Filled 2011-10-20: qty 1

## 2011-10-20 MED ORDER — HEPARIN (PORCINE) IN NACL 100-0.45 UNIT/ML-% IJ SOLN
1000.0000 [IU]/h | Freq: Once | INTRAMUSCULAR | Status: AC
  Administered 2011-10-20: 1000 [IU]/h via INTRAVENOUS
  Filled 2011-10-20: qty 250

## 2011-10-20 MED ORDER — NITROGLYCERIN IN D5W 200-5 MCG/ML-% IV SOLN
5.0000 ug/min | Freq: Once | INTRAVENOUS | Status: AC
  Administered 2011-10-20: 5 ug/min via INTRAVENOUS
  Filled 2011-10-20: qty 250

## 2011-10-20 NOTE — ED Notes (Signed)
Pt c/o cp/weakness. Pt took 3 sl nitro and 324 asa pta.

## 2011-10-20 NOTE — ED Notes (Signed)
Spoke with [employer] in pharmacy. 4000 unit Bolus if Heparin followed by 1000 units hr.

## 2011-10-20 NOTE — H&P (Signed)
CC:  Near syncope, elevated troponin  HPI: Jeffrey [name] with PMH of CAD s/p remote CABG and multiple PCIs who presents for evaluation of a minimally elevated troponin of 0.16 in the setting of near syncope.  Historically, is he s/p CABG with LIMA-LAD, SVG-RCA, and SVG-OM.  In 05/2010 he had a 3x15 promus DES placed in the body of the SVG to Cx and 2.25x15 promus in the distal OM (via SVG).  He was cathed again in 07/2010 and 04/2011 for small NSTEMIs.  Most recent cath 10/12 demonstrated 100% RCA, 100% Cx, 80% LAD, chronically occluded SVG-RCA, LIMA-LAD patent, and patent SVG to Cx however, the distal stent was occluded.  He reports doing well since his last cath.  Today, he was standing in line for a license and felt a sense of indigestion that was followed by feeling "bad," diaphoresis, and near syncope.  This resolved.  However, because of these symptoms he was sent to [facility].  There his troponin was elevated at 0.16 and he was started on heparin and transferred to [facility].  Currently, he is chest pain free.  At baseline, the patient enjoys "piddling" around and denies recent angina or CHF sxs.  He denies any syncope.      Past Medical History  Diagnosis Date  . Coronary artery disease     s/p CABG, s/p stenting to distal LCx, s/p stent to proximal SVG-LCx in 05/2010,  NSTEMI in 04/2011 with cath showing occluded OM2 graft   . Diabetes mellitus   . Hypertension   . PUD (peptic ulcer disease)   . Hypercholesteremia   . Myocardial infarct, old   . GERD (gastroesophageal reflux disease)   . Arthritis   . Osteoarthritis   . Stroke   . Pneumonia     History   Social History  . Marital Status: Divorced    Spouse Name: N/A    Number of Children: N/A  . Years of Education: N/A   Occupational History  . retired     Visual merchandiser   Social History Main Topics  . Smoking status: Former Smoker    Types: Cigarettes  . Smokeless tobacco: Current User    Types: Chew  . Alcohol Use: No  . Drug Use:  No  . Sexually Active: No   Other Topics Concern  . Not on file   Social History Narrative  . No narrative on file    History reviewed. No pertinent family history. Family History: Patient reports that he doesn't know anything about his family members medical care.   Medications Prior to Admission  Medication Dose Route Frequency Provider Last Rate Last Dose  . heparin ADULT infusion 100 units/mL (25000 units/250 mL)  1,000 Units/hr Intravenous Once [name], MD 10 mL/hr at 10/20/11 1834 1,000 Units/hr at 10/20/11 1834  . heparin bolus via infusion 4,000 Units  4,000 Units Intravenous Once [name], MD   4,000 Units at 10/20/11 1833  . morphine 4 MG/ML injection 4 mg  4 mg Intravenous Once [name], MD   4 mg at 10/20/11 1721  . nitroGLYCERIN 0.2 mg/mL in dextrose 5 % infusion  5-200 mcg/min Intravenous Once [name], MD 1.5 mL/hr at 10/20/11 1829 5 mcg/min at 10/20/11 1829  . ondansetron (ZOFRAN) injection 4 mg  4 mg Intravenous Once [name], MD   4 mg at 10/20/11 1720  . DISCONTD: nitroGLYCERIN (NITROSTAT) SL tablet 0.4 mg  0.4 mg Sublingual Q5 min PRN Iva L  Lynelle Doctor, MD       Medications Prior to Admission  Medication Sig Dispense Refill  . aspirin EC 81 MG tablet Take 162 mg by mouth at bedtime. States that he has been taking for pain also      . Bismuth Subsalicylate (PEPTO-BISMOL PO) Take by mouth daily as needed. Patient states that he just takes a couple of swallows when needed to control bowels.       . carvedilol (COREG) 6.25 MG tablet Take 6.25 mg by mouth 2 (two) times daily with a meal.       . clopidogrel (PLAVIX) 75 MG tablet Take 1 tablet (75 mg total) by mouth daily.  30 tablet  2  . COD LIVER OIL PO Take 1 capsule by mouth daily.        Tery Sanfilippo Sodium (STOOL SOFTENER) 100 MG capsule Take 200-300 mg by mouth at bedtime. Constipation      . isosorbide mononitrate (IMDUR) 30 MG 24 hr tablet Take 1 tablet (30 mg total) by mouth daily.  30 tablet  6  .  loratadine (CLARITIN) 10 MG tablet Take 10 mg by mouth daily.        . metFORMIN (GLUCOPHAGE) 500 MG tablet Take 500 mg by mouth 2 (two) times daily.       . nitroGLYCERIN (NITROSTAT) 0.4 MG SL tablet Place 0.4 mg under the tongue every 5 (five) minutes as needed. Chest pain/patient said he has never had to use medication as he takes Rolaids if he gets chest pain and that stops pain       . pantoprazole (PROTONIX) 40 MG tablet Take 40 mg by mouth daily. **Take one tablet daily one hour before breakfast**      . ramipril (ALTACE) 5 MG capsule Take 5 mg by mouth daily.        . rosuvastatin (CRESTOR) 10 MG tablet Take 10 mg by mouth at bedtime.       . saxagliptin HCl (ONGLYZA) 5 MG TABS tablet Take 5 mg by mouth at bedtime.       . Tamsulosin HCl (FLOMAX) 0.4 MG CAPS Take 0.4 mg by mouth at bedtime.         No Known Allergies  Review of Systems: All systems reviewed and are negative except as mentioned above in the history of present illness.    PHYSICAL EXAM: Filed Vitals:   10/20/11 2300  BP: 119/51  Pulse: 75  Temp:   Resp: 15   GENERAL: No acute distress.   HEENT: Normocephalic, atraumatic.  Oropharynx is pink and moist without lesions.  NECK: Supple, no LAD, no JVD, no masses. CV: Regular rate and rhythm with no murmurs, rubs, or gallops.   LUNGS: Clear to auscultation bilaterally.   ABDOMEN: +BS, soft, nontender, nondistended.  EXTREMITIES: No clubbing, cyanosis, or edema.   NEURO: AO x 3, no focal deficits. PYSCH: Normal affect. SKIN: No rashes.    ECG: NSR at 83, nonspecific STTW changes laterally.   Results for orders placed during the hospital encounter of 10/20/11 (from the past 24 hour(s))  CBC     Status: Abnormal   Collection Time   10/20/11  4:49 PM      Component Value Range   WBC 6.6  4.0 - 10.5 (K/uL)   RBC 3.94 (*) 4.22 - 5.81 (MIL/uL)   Hemoglobin 11.8 (*) 13.0 - 17.0 (g/dL)   HCT 16.1 (*) 09.6 - 52.0 (%)   MCV 90.4  78.0 - 100.0 (fL)  MCH 29.9  26.0 -  34.0 (pg)   MCHC 33.1  30.0 - 36.0 (g/dL)   RDW 16.1  09.6 - 04.5 (%)   Platelets 162  150 - 400 (K/uL)  BASIC METABOLIC PANEL     Status: Abnormal   Collection Time   10/20/11  4:49 PM      Component Value Range   Sodium 135  135 - 145 (mEq/L)   Potassium 4.2  3.5 - 5.1 (mEq/L)   Chloride 101  96 - 112 (mEq/L)   CO2 24  19 - 32 (mEq/L)   Glucose, Bld 132 (*) 70 - 99 (mg/dL)   BUN 17  6 - 23 (mg/dL)   Creatinine, Ser 4.09  0.50 - 1.35 (mg/dL)   Calcium 9.7  8.4 - 81.1 (mg/dL)   GFR calc non Af Amer 76 (*) >90 (mL/min)   GFR calc Af Amer 88 (*) >90 (mL/min)  POCT I-STAT TROPONIN I     Status: Abnormal   Collection Time   10/20/11  4:56 PM      Component Value Range   Troponin i, poc 0.16 (*) 0.00 - 0.08 (ng/mL)   Comment NOTIFIED PHYSICIAN     Comment 3       POC Troponin I Dr. Lars Mage made aware    DIFFERENTIAL     Status: Normal   Collection Time   10/20/11  5:40 PM      Component Value Range   Neutrophils Relative 59  43 - 77 (%)   Neutro Abs 3.9  1.7 - 7.7 (K/uL)   Lymphocytes Relative 27  12 - 46 (%)   Lymphs Abs 1.8  0.7 - 4.0 (K/uL)   Monocytes Relative 10  3 - 12 (%)   Monocytes Absolute 0.7  0.1 - 1.0 (K/uL)   Eosinophils Relative 4  0 - 5 (%)   Eosinophils Absolute 0.3  0.0 - 0.7 (K/uL)   Basophils Relative 0  0 - 1 (%)   Basophils Absolute 0.0  0.0 - 0.1 (K/uL)  APTT     Status: Normal   Collection Time   10/20/11  5:40 PM      Component Value Range   aPTT 28  24 - 37 (seconds)  PROTIME-INR     Status: Normal   Collection Time   10/20/11  5:40 PM      Component Value Range   Prothrombin Time 13.0  11.6 - 15.2 (seconds)   INR 0.96  0.00 - 1.49   TROPONIN I     Status: Normal   Collection Time   10/20/11  5:40 PM      Component Value Range   Troponin I <0.30  <0.30 (ng/mL)  GLUCOSE, CAPILLARY     Status: Abnormal   Collection Time   10/20/11  9:18 PM      Component Value Range   Glucose-Capillary 144 (*) 70 - 99 (mg/dL)   Chest Portable 1 View  10/20/2011   *RADIOLOGY REPORT*  Clinical Data: Chest pain, weakness  PORTABLE CHEST - 1 VIEW  Comparison: 07/30/2011  Findings: Coronary bypass changes evident.  Normal heart size and vascularity.  Minimal left lower lobe atelectasis / scarring. Negative for CHF, pneumonia, collapse, consolidation, effusion or pneumothorax.  Trachea midline.  Stable exam.  IMPRESSION: Stable portable chest exam.  No superimposed acute process  Original Report Authenticated By: Judie Petit. Ruel Favors, M.D.     ASSESSMENT and PLAN: 71 [name] with PMH of CAD s/p CABG and  PCI (see HPI) who presents with an episode of near syncope and minimally elevated troponin.    1: Admit to [facility]  2: Near syncope - his symptoms seem most consistent with a vasovagal event.  His troponin here is normal.  He has no clear cut history of angina and is currently chest pain free.  Recommend ruling out for MI with cardiac enzymes and monitoring on tele.  Check TTE to assess LV function.  If his LV function is normal and his troponins are normal, I do not think that he needs another cardiac cath.  Continue with medical therapy with home meds (ASA, plavix, imdur, coreg) and continue IV heparin until he rules out.   Check FLP.   3: DM - SSI, hold oral meds  4: FEN: SLIVF, Electrolytes are stable, NPO after midnight.    5: DVT prophylaxis: N/A as he is on IV heparin.  Bernestine Amass. Mayford Knife, MD Admission Level 3

## 2011-10-20 NOTE — ED Provider Notes (Signed)
History  This chart was scribed for [name], MD by Bennett Scrape. This patient was seen in room APA19/APA19 and the patient's care was started at 5:01PM.  CSN: [id number]  Arrival date & time 10/20/11  1634   First MD Initiated Contact with Patient 10/20/11 1658      Chief Complaint  Patient presents with  . Chest Pain    Patient is a 78 y.o. male presenting with chest pain. No language interpreter was used.  Chest Pain The chest pain began less than 1 hour ago. Duration of episode(s) is 1 hour. Chest pain occurs constantly. The chest pain is improving. Associated with: nothing. At its most intense, the pain is at 10/10. The pain is currently at 2/10. The severity of the pain is moderate. The quality of the pain is described as pressure-like. The pain does not radiate. Exacerbated by: nothing. Pertinent negatives for primary symptoms include no syncope, no shortness of breath, no palpitations, no nausea and no dizziness.  Associated symptoms include diaphoresis. He tried nitroglycerin and aspirin for the symptoms. Risk factors include no known risk factors.  His past medical history is significant for diabetes, hypertension and MI.     Jeffrey [name] is a 78 y.o. male with a h/o MI who presents to the Emergency Department complaining of one hour of gradual onset, gradually improving, constant sternal chest pain with associated diaphoresis that started while he was standing inline at the [facility].  Relates he had been doing a lot of errands today. The pain is described as a pressure feeling. He has a h/o MI that occurred one year ago. Pt states that the symptoms he experienced with the MI are different than the symptoms he is experiencing now. He states that he experienced mild chest pain but didn't feel that it was serious; whereas, this time he experienced weakness and numbness in both legs with the MI.  He reports taking 3 nitroglycerins and 325 mg ASA with moderate improvement in symptoms.  He states that he spaced the nitroglycerins out over 5 minutes apart and he was standing when he took them. He states that this was the first time he has had to use the nitroglycerins since he had his MI last year. He denies nausea, SOB, dizziness and HA as associated symptoms.  He states he felt weak and felt like he was going to pass out before he took the NTG and he also got clammy. Pt states that he was put on plavix last year after the stents. He reports that the pain is almost gone currently. He has a h/o CAD, diabetes and HTN. Pt is a former smoker but denies any alcohol use. Pt states his pain was a 10 at the [facility] and after the ASA 325 mg and NTG SL x3 he took while at the [facility] his pain was a "5/10" on EMS arrival and currently is a "2/10".   Pt states he had one blockage they were unable to open up with his stents last year. He is s/p CABG 20 years ago.   Pt is seen by Dr. Dietrich Pates at [facility], Pt's PCP is Dr. Sherwood Gambler at [facility]   Past Medical History  Diagnosis Date  . Coronary artery disease     s/p CABG, s/p stenting to distal LCx, s/p stent to proximal SVG-LCx in 05/2010,  NSTEMI in 04/2011 with cath showing occluded OM2 graft   . Diabetes mellitus   . Hypertension   . PUD (peptic ulcer  disease)   . Hypercholesteremia   . Myocardial infarct, old   . GERD (gastroesophageal reflux disease)   . Arthritis     Past Surgical History  Procedure Date  . Tonsillectomy   . Post aortocoronary bypass surgery   . Coronary artery bypass graft   . Coronary stent placement     No family history on file.  History  Substance Use Topics  . Smoking status: Former Smoker-Quit 50 years ago    Types: Cigarettes  . Smokeless tobacco: Current User    Types: Chew  . Alcohol Use: No  widowed He lives alone.   Review of Systems  Constitutional: Positive for diaphoresis.  Respiratory: Negative for shortness of breath.   Cardiovascular: Positive for chest pain. Negative for palpitations  and syncope.  Gastrointestinal: Negative for nausea.  Neurological: Negative for dizziness.  All other systems reviewed and are negative.    Allergies  Review of patient's allergies indicates no known allergies.  Home Medications   Current Outpatient Rx  Name Route Sig Dispense Refill  . ASPIRIN EC 81 MG PO TBEC Oral Take 81 mg by mouth daily.      Marland Kitchen PEPTO-BISMOL PO Oral Take by mouth daily as needed. Patient states that he just takes a couple of swallows when needed to control bowels.     Marland Kitchen CARVEDILOL 6.25 MG PO TABS Oral Take 6.25 mg by mouth 2 (two) times daily with a meal.     . CLOPIDOGREL BISULFATE 75 MG PO TABS Oral Take 1 tablet (75 mg total) by mouth daily. 30 tablet 2  . COD LIVER OIL PO Oral Take 1 capsule by mouth daily.      Marland Kitchen DOCUSATE SODIUM 100 MG PO TABS Oral Take 100 mg by mouth daily as needed. Constipation     . ISOSORBIDE MONONITRATE ER 30 MG PO TB24 Oral Take 1 tablet (30 mg total) by mouth daily. 30 tablet 6    DOSE INCREASE  . LORATADINE 10 MG PO TABS Oral Take 10 mg by mouth daily.      Marland Kitchen METFORMIN HCL 500 MG PO TABS Oral Take 500 mg by mouth 2 (two) times daily.     Marland Kitchen NITROGLYCERIN 0.4 MG SL SUBL Sublingual Place 0.4 mg under the tongue every 5 (five) minutes as needed. Chest pain/patient said he has never had to use medication as he takes Rolaids if he gets chest pain and that stops pain     . PANTOPRAZOLE SODIUM 40 MG PO TBEC Oral Take 40 mg by mouth daily.      Marland Kitchen RAMIPRIL 5 MG PO CAPS Oral Take 5 mg by mouth daily.      Marland Kitchen ROSUVASTATIN CALCIUM 10 MG PO TABS Oral Take 10 mg by mouth daily.      Marland Kitchen SAXAGLIPTIN HCL 5 MG PO TABS Oral Take 5 mg by mouth daily.      Marland Kitchen TAMSULOSIN HCL 0.4 MG PO CAPS Oral Take 0.4 mg by mouth at bedtime.       Triage Vitals: BP 121/69  Pulse 82  Temp 98.6 F (37 C)  Resp 18  Ht 5\' 10"  (1.778 m)  Wt 180 lb (81.647 kg)  BMI 25.83 kg/m2  SpO2 94%  Vital signs normal BP during my exam was 104 systolic  Physical Exam  Nursing  note and vitals reviewed. Constitutional: He is oriented to person, place, and time. He appears well-developed and well-nourished. No distress.  HENT:  Head: Normocephalic and atraumatic.  Right  Ear: External ear normal.  Left Ear: External ear normal.       Dry mucous membranes, edentulous  Eyes: Conjunctivae and EOM are normal. Pupils are equal, round, and reactive to light.  Neck: Normal range of motion. Neck supple.  Cardiovascular: Normal rate, regular rhythm, normal heart sounds and intact distal pulses.   Pulmonary/Chest: Effort normal and breath sounds normal. No respiratory distress. He has no wheezes. He has no rales.  Abdominal: Soft. Bowel sounds are normal. He exhibits no distension. There is no tenderness. There is no rebound and no guarding.  Musculoskeletal: Normal range of motion. He exhibits edema (Trace edema of the right leg, no edema of the left, his donor vein was from his right leg).  Neurological: He is alert and oriented to person, place, and time.  Skin: Skin is warm and dry.  Psychiatric: He has a normal mood and affect. His behavior is normal.    ED Course  Procedures (including critical care time)   Medications  nitroGLYCERIN 0.2 mg/mL in dextrose 5 % infusion (not administered)  heparin ADULT infusion 100 units/mL (25000 units/250 mL) (not administered)  heparin bolus via infusion 4,000 Units (not administered)  aspirin 325 MG tablet (not administered) took just PTA  morphine 4 MG/ML injection 4 mg (4 mg Intravenous Given 10/20/11 1721)  ondansetron (ZOFRAN) injection 4 mg (4 mg Intravenous Given 10/20/11 1720)   Pt took ASA 325 mg just prior to coming to the ED as well as NTG SL x 3.  DIAGNOSTIC STUDIES: Oxygen Saturation is 97% on O2, normal by my interpretation.    COORDINATION OF CARE: 5:11PM-Discussed blood work and treatment plan with pt and pt agreed to plan.  17:48 Dr Shirlee Latch accepts in transfer to [facility] step down, wants heparin to be started.      Pt's BP while I was doing his exam was 104 systolic, however when I went back to discuss his admission and transfer his BP was 115/56 and he was started on NTG drip.   Results for orders placed during the hospital encounter of 10/20/11  CBC      Component Value Range   WBC 6.6  4.0 - 10.5 (K/uL)   RBC 3.94 (*) 4.22 - 5.81 (MIL/uL)   Hemoglobin 11.8 (*) 13.0 - 17.0 (g/dL)   HCT 16.1 (*) 09.6 - 52.0 (%)   MCV 90.4  78.0 - 100.0 (fL)   MCH 29.9  26.0 - 34.0 (pg)   MCHC 33.1  30.0 - 36.0 (g/dL)   RDW 04.5  40.9 - 81.1 (%)   Platelets 162  150 - 400 (K/uL)  BASIC METABOLIC PANEL      Component Value Range   Sodium 135  135 - 145 (mEq/L)   Potassium 4.2  3.5 - 5.1 (mEq/L)   Chloride 101  96 - 112 (mEq/L)   CO2 24  19 - 32 (mEq/L)   Glucose, Bld 132 (*) 70 - 99 (mg/dL)   BUN 17  6 - 23 (mg/dL)   Creatinine, Ser 9.14  0.50 - 1.35 (mg/dL)   Calcium 9.7  8.4 - 78.2 (mg/dL)   GFR calc non Af Amer 76 (*) >90 (mL/min)   GFR calc Af Amer 88 (*) >90 (mL/min)  DIFFERENTIAL      Component Value Range   Neutrophils Relative 59  43 - 77 (%)   Neutro Abs 3.9  1.7 - 7.7 (K/uL)   Lymphocytes Relative 27  12 - 46 (%)   Lymphs Abs 1.8  0.7 - 4.0 (K/uL)   Monocytes Relative 10  3 - 12 (%)   Monocytes Absolute 0.7  0.1 - 1.0 (K/uL)   Eosinophils Relative 4  0 - 5 (%)   Eosinophils Absolute 0.3  0.0 - 0.7 (K/uL)   Basophils Relative 0  0 - 1 (%)   Basophils Absolute 0.0  0.0 - 0.1 (K/uL)  POCT I-STAT TROPONIN I      Component Value Range   Troponin i, poc 0.16 (*) 0.00 - 0.08 (ng/mL)   Comment NOTIFIED PHYSICIAN     Comment 3       POC Troponin I Dr. Lars Mage made aware     Laboratory interpretation all normal except positive troponin, mild anemia   Chest Portable 1 View  10/20/2011  *RADIOLOGY REPORT*  Clinical Data: Chest pain, weakness  PORTABLE CHEST - 1 VIEW  Comparison: 07/30/2011  Findings: Coronary bypass changes evident.  Normal heart size and vascularity.  Minimal left lower lobe  atelectasis / scarring. Negative for CHF, pneumonia, collapse, consolidation, effusion or pneumothorax.  Trachea midline.  Stable exam.  IMPRESSION: Stable portable chest exam.  No superimposed acute process  Original Report Authenticated By: Judie Petit. Ruel Favors, M.D.        Date: 10/20/2011  Rate: 83  Rhythm: normal sinus rhythm  QRS Axis: normal  Intervals: normal  ST/T Wave abnormalities: nonspecific ST/T changes  Conduction Disutrbances:none  Narrative Interpretation:   Old EKG Reviewed: none available    1. Chest pain   2. NSTEMI (non-ST elevated myocardial infarction)     Plan transfer to [facility] for admission to [facility]  Devoria Albe, MD, FACEP  CRITICAL CARE Performed by: Devoria Albe L   Total critical care time: 31 min  Critical care time was exclusive of separately billable procedures and treating other patients.  Critical care was necessary to treat or prevent imminent or life-threatening deterioration.  Critical care was time spent personally by me on the following activities: development of treatment plan with patient and/or surrogate as well as nursing, discussions with consultants, evaluation of patient's response to treatment, examination of patient, obtaining history from patient or surrogate, ordering and performing treatments and interventions, ordering and review of laboratory studies, ordering and review of radiographic studies, pulse oximetry and re-evaluation of patient's condition.   MDM    I personally performed the services described in this documentation, which was scribed in my presence. The recorded information has been reviewed and considered. Devoria Albe, MD, FACEP        [name], MD 10/21/11 [phone]

## 2011-10-20 NOTE — ED Notes (Signed)
Report to [facility] to [place], [place]

## 2011-10-21 DIAGNOSIS — Z951 Presence of aortocoronary bypass graft: Secondary | ICD-10-CM

## 2011-10-21 DIAGNOSIS — I369 Nonrheumatic tricuspid valve disorder, unspecified: Secondary | ICD-10-CM

## 2011-10-21 DIAGNOSIS — R55 Syncope and collapse: Principal | ICD-10-CM

## 2011-10-21 DIAGNOSIS — E119 Type 2 diabetes mellitus without complications: Secondary | ICD-10-CM

## 2011-10-21 DIAGNOSIS — R748 Abnormal levels of other serum enzymes: Secondary | ICD-10-CM

## 2011-10-21 LAB — CBC
Hemoglobin: 11.9 g/dL — ABNORMAL LOW (ref 13.0–17.0)
RBC: 3.94 MIL/uL — ABNORMAL LOW (ref 4.22–5.81)
WBC: 6.7 10*3/uL (ref 4.0–10.5)

## 2011-10-21 LAB — GLUCOSE, CAPILLARY
Glucose-Capillary: 184 mg/dL — ABNORMAL HIGH (ref 70–99)
Glucose-Capillary: 163 mg/dL — ABNORMAL HIGH (ref 70–99)
Glucose-Capillary: 215 mg/dL — ABNORMAL HIGH (ref 70–99)
Glucose-Capillary: 159 mg/dL — ABNORMAL HIGH (ref 70–99)

## 2011-10-21 LAB — HEPARIN LEVEL (UNFRACTIONATED)
Heparin Unfractionated: 0.34 IU/mL (ref 0.30–0.70)
Heparin Unfractionated: 0.21 IU/mL — ABNORMAL LOW (ref 0.30–0.70)

## 2011-10-21 LAB — CARDIAC PANEL(CRET KIN+CKTOT+MB+TROPI)
Relative Index: 2.4 (ref 0.0–2.5)
Relative Index: 2.5 (ref 0.0–2.5)
Total CK: 455 U/L — ABNORMAL HIGH (ref 7–232)
Troponin I: <0.3 ng/mL (ref <0.30)
Troponin I: <0.3 ng/mL (ref <0.30)

## 2011-10-21 LAB — LIPID PANEL: LDL Cholesterol: 59 mg/dL (ref 0–99)

## 2011-10-21 LAB — HEMOGLOBIN A1C: Hgb A1c MFr Bld: 8.1 % — ABNORMAL HIGH (ref <5.7)

## 2011-10-21 MED ORDER — PANTOPRAZOLE SODIUM 40 MG PO TBEC
40.0000 mg | DELAYED_RELEASE_TABLET | Freq: Every day | ORAL | Status: DC
  Administered 2011-10-21 – 2011-10-22 (×2): 40 mg via ORAL
  Filled 2011-10-21 (×2): qty 1

## 2011-10-21 MED ORDER — ATORVASTATIN CALCIUM 10 MG PO TABS
10.0000 mg | ORAL_TABLET | Freq: Every day | ORAL | Status: DC
  Administered 2011-10-21: 10 mg via ORAL
  Filled 2011-10-21 (×2): qty 1

## 2011-10-21 MED ORDER — ISOSORBIDE MONONITRATE ER 30 MG PO TB24
30.0000 mg | ORAL_TABLET | Freq: Every day | ORAL | Status: DC
  Administered 2011-10-21 – 2011-10-22 (×2): 30 mg via ORAL
  Filled 2011-10-21 (×2): qty 1

## 2011-10-21 MED ORDER — INSULIN ASPART 100 UNIT/ML ~~LOC~~ SOLN
0.0000 [IU] | Freq: Three times a day (TID) | SUBCUTANEOUS | Status: DC
  Administered 2011-10-21: 3 [IU] via SUBCUTANEOUS
  Administered 2011-10-21: 5 [IU] via SUBCUTANEOUS
  Administered 2011-10-21 – 2011-10-22 (×3): 3 [IU] via SUBCUTANEOUS

## 2011-10-21 MED ORDER — ACETAMINOPHEN 325 MG PO TABS: 650.0000 mg | ORAL_TABLET | ORAL | Status: DC | PRN

## 2011-10-21 MED ORDER — HEPARIN BOLUS VIA INFUSION
1200.0000 [IU] | Freq: Once | INTRAVENOUS | Status: AC
  Administered 2011-10-21: 1200 [IU] via INTRAVENOUS
  Filled 2011-10-21: qty 1200

## 2011-10-21 MED ORDER — HEPARIN (PORCINE) IN NACL 100-0.45 UNIT/ML-% IJ SOLN
1000.0000 [IU]/h | INTRAMUSCULAR | Status: DC
  Administered 2011-10-21: 1000 [IU]/h via INTRAVENOUS
  Filled 2011-10-21: qty 250

## 2011-10-21 MED ORDER — ONDANSETRON HCL 4 MG/2ML IJ SOLN: 4.0000 mg | Freq: Four times a day (QID) | INTRAMUSCULAR | Status: DC | PRN

## 2011-10-21 MED ORDER — NITROFURANTOIN MACROCRYSTAL 100 MG PO CAPS
100.0000 mg | ORAL_CAPSULE | Freq: Every day | ORAL | Status: DC
  Administered 2011-10-21 (×2): 100 mg via ORAL
  Filled 2011-10-21 (×3): qty 1

## 2011-10-21 MED ORDER — CLOPIDOGREL BISULFATE 75 MG PO TABS
75.0000 mg | ORAL_TABLET | Freq: Every day | ORAL | Status: DC
  Administered 2011-10-21 – 2011-10-22 (×2): 75 mg via ORAL
  Filled 2011-10-21 (×2): qty 1

## 2011-10-21 MED ORDER — TAMSULOSIN HCL 0.4 MG PO CAPS
0.4000 mg | ORAL_CAPSULE | Freq: Every day | ORAL | Status: DC
  Administered 2011-10-21 (×2): 0.4 mg via ORAL
  Filled 2011-10-21 (×3): qty 1

## 2011-10-21 MED ORDER — SODIUM CHLORIDE 0.9 % IV SOLN
INTRAVENOUS | Status: DC
  Administered 2011-10-21: 10 mL/h via INTRAVENOUS

## 2011-10-21 MED ORDER — RAMIPRIL 5 MG PO CAPS
5.0000 mg | ORAL_CAPSULE | Freq: Every day | ORAL | Status: DC
  Administered 2011-10-21 – 2011-10-22 (×2): 5 mg via ORAL
  Filled 2011-10-21 (×2): qty 1

## 2011-10-21 MED ORDER — ASPIRIN 325 MG PO TABS
325.0000 mg | ORAL_TABLET | Freq: Every day | ORAL | Status: DC
  Administered 2011-10-21 – 2011-10-22 (×2): 325 mg via ORAL
  Filled 2011-10-21 (×2): qty 1

## 2011-10-21 MED ORDER — HEPARIN (PORCINE) IN NACL 100-0.45 UNIT/ML-% IJ SOLN
1150.0000 [IU]/h | INTRAMUSCULAR | Status: DC
Start: 2011-10-21 — End: 2011-10-21
  Administered 2011-10-21: 1150 [IU]/h via INTRAVENOUS

## 2011-10-21 MED ORDER — LORATADINE 10 MG PO TABS
10.0000 mg | ORAL_TABLET | Freq: Every day | ORAL | Status: DC
  Administered 2011-10-21 – 2011-10-22 (×2): 10 mg via ORAL
  Filled 2011-10-21 (×2): qty 1

## 2011-10-21 MED ORDER — ASPIRIN 81 MG PO CHEW: 324.0000 mg | CHEWABLE_TABLET | ORAL | Status: AC

## 2011-10-21 MED ORDER — ASPIRIN 300 MG RE SUPP: 300.0000 mg | RECTAL | Status: AC

## 2011-10-21 MED ORDER — CARVEDILOL 6.25 MG PO TABS
6.2500 mg | ORAL_TABLET | Freq: Two times a day (BID) | ORAL | Status: DC
  Administered 2011-10-21 – 2011-10-22 (×3): 6.25 mg via ORAL
  Filled 2011-10-21 (×6): qty 1

## 2011-10-21 MED ORDER — NITROGLYCERIN 0.4 MG SL SUBL: 0.4000 mg | SUBLINGUAL_TABLET | SUBLINGUAL | Status: DC | PRN

## 2011-10-21 NOTE — Progress Notes (Signed)
SUBJECTIVE: The patient is doing well today.  At this time, he denies chest pain, shortness of breath, or any new concerns.  He reports that he thinks he "over did it" yesterday but now feels back to baseline.     Marland Kitchen aspirin  324 mg Oral NOW   Or  . aspirin  300 mg Rectal NOW  . aspirin  325 mg Oral Daily  . atorvastatin  10 mg Oral q1800  . carvedilol  6.25 mg Oral BID WC  . clopidogrel  75 mg Oral Q breakfast  . heparin  1,000 Units/hr Intravenous Once  . heparin  4,000 Units Intravenous Once  . insulin aspart  0-15 Units Subcutaneous TID WC  . isosorbide mononitrate  30 mg Oral Daily  . loratadine  10 mg Oral Daily  . morphine  4 mg Intravenous Once  . nitrofurantoin  100 mg Oral QHS  . nitroGLYCERIN  5-200 mcg/min Intravenous Once  . ondansetron (ZOFRAN) IV  4 mg Intravenous Once  . pantoprazole  40 mg Oral Daily  . ramipril  5 mg Oral Daily  . Tamsulosin HCl  0.4 mg Oral QHS      . heparin 1,000 Units/hr (10/21/11 0231)    OBJECTIVE: Physical Exam: Filed Vitals:   10/20/11 2300 10/21/11 0000 10/21/11 0400 10/21/11 0412  BP: 119/51 139/64 130/55   Pulse: 75 72 68   Temp:  97.8 F (36.6 C) 98.6 F (37 C) 98.6 F (37 C)  TempSrc:  Oral Oral Oral  Resp: 15 15 15    Height:      Weight:      SpO2: 99% 99% 100% 98%    Intake/Output Summary (Last 24 hours) at 10/21/11 0756 Last data filed at 10/21/11 0700  Gross per 24 hour  Intake 534.11 ml  Output   1465 ml  Net -930.89 ml    Telemetry reveals sinus rhythm  GEN- The patient is elderly appearing, alert and oriented x 3 today.   Head- normocephalic, atraumatic Eyes-  Sclera clear, conjunctiva pink Ears- hearing intact Oropharynx- clear Neck- supple, no JVP Lymph- no cervical lymphadenopathy Lungs- Clear to ausculation bilaterally, normal work of breathing Heart- Regular rate and rhythm, no murmurs, rubs or gallops, PMI not laterally displaced GI- soft, NT, ND, + BS Extremities- no clubbing, cyanosis, or  edema Skin- no rash or lesion Psych- euthymic mood, full affect Neuro- strength and sensation are intact  LABS: Basic Metabolic Panel:  Basename 10/20/11 1649  NA 135  K 4.2  CL 101  CO2 24  GLUCOSE 132*  BUN 17  CREATININE 0.96  CALCIUM 9.7  MG --  PHOS --   Liver Function Tests: No results found for this basename: AST:2,ALT:2,ALKPHOS:2,BILITOT:2,PROT:2,ALBUMIN:2 in the last 72 hours No results found for this basename: LIPASE:2,AMYLASE:2 in the last 72 hours CBC:  Basename 10/21/11 0105 10/20/11 1740 10/20/11 1649  WBC 6.7 -- 6.6  NEUTROABS -- 3.9 --  HGB 11.9* -- 11.8*  HCT 35.6* -- 35.6*  MCV 90.4 -- 90.4  PLT 155 -- 162   Cardiac Enzymes:  Basename 10/21/11 0058 10/20/11 1740  CKTOTAL 546* --  CKMB 12.9* --  CKMBINDEX -- --  TROPONINI <0.30 <0.30    RADIOLOGY: Chest Portable 1 View  10/20/2011  *RADIOLOGY REPORT*  Clinical Data: Chest pain, weakness  PORTABLE CHEST - 1 VIEW  Comparison: 07/30/2011  Findings: Coronary bypass changes evident.  Normal heart size and vascularity.  Minimal left lower lobe atelectasis / scarring. Negative for CHF, pneumonia,  collapse, consolidation, effusion or pneumothorax.  Trachea midline.  Stable exam.  IMPRESSION: Stable portable chest exam.  No superimposed acute process  Original Report Authenticated By: Judie Petit. Ruel Favors, M.D.    ASSESSMENT AND PLAN:  Active Problems:  * No active hospital problems. *   59 YOWM with PMH of CAD s/p CABG and PCI (see HPI) who presents with an episode of near syncope.   1: Near syncope- I agree that his symptoms seem most consistent with a vasovagal event. His troponin here is normal thus far. He has no clear cut history of angina and is currently chest pain free. We will continue to cycle cardiac enzymes this am and monitoring on tele.  We will check TTE to assess LV function.  At this point, I would not anticipate repeat cath as he is chest pain free and doing well without ischemic EKG  changes.  However, if he rules in with large elevation in CMs, then cath may be warranted at that point.  2. CAD- as above Continue medical management Stop heparin if he rules out for MI  3: DM - SSI, hold oral meds  Hillis Range, MD 10/21/2011 7:56 AM

## 2011-10-21 NOTE — Progress Notes (Signed)
ANTICOAGULATION CONSULT NOTE - Follow Up Consult  Pharmacy Consult for Heparin Indication: chest pain/ACS  No Known Allergies  Vital Signs: Temp: 98.2 F (36.8 C) (04/09 0813) Temp src: Oral (04/09 0813) BP: 162/59 mmHg (04/09 0800) Pulse Rate: 71  (04/09 0800)  Labs:  Alvira Philips 10/21/11 1610 10/21/11 0610 10/21/11 0105 10/21/11 0058 10/20/11 1740 10/20/11 1649  HGB -- -- 11.9* -- -- 11.8*  HCT -- -- 35.6* -- -- 35.6*  PLT -- -- 155 -- -- 162  APTT -- -- -- -- 28 --  LABPROT -- -- -- -- 13.0 --  INR -- -- -- -- 0.96 --  HEPARINUNFRC 0.21* -- 0.34 -- -- --  CREATININE -- -- -- -- -- 0.96  CKTOTAL -- 544* -- 546* -- --  CKMB -- 13.5* -- 12.9* -- --  TROPONINI -- <0.30 -- <0.30 <0.30 --   Estimated Creatinine Clearance: 63.4 ml/min (by C-G formula based on Cr of 0.96).  Medications:  Heparin @ 1000 units/hr  Assessment: 82yom continues on heparin for rule out MI. Confirmatory heparin level is subtherapeutic. No bleeding/problems noted. No plans for cath at this point as CEs negative, EKG w/o ischemic changes, and patient is CP free.  Goal of Therapy:  Heparin level 0.3-0.7 units/ml   Plan:  1) Heparin bolus 1200 units x 1 2) Increase heparin drip at 1150 units/hr 3) 8h heparin level  Fredrik Rigger 10/21/2011,10:48 AM

## 2011-10-21 NOTE — Progress Notes (Addendum)
PHARMACY - ANTICOAGULATION CONSULT Initial Consult Note  Pharmacy Consult for: Heparin  Indication: chest pain/ACS    Patient Data:   Allergies: No Known Allergies  Patient Measurements: Height: 5\' 10"  (177.8 cm) Weight: 181 lb 7 oz (82.3 kg) IBW/kg (Calculated) : 73  Heparin dosing weight = 82 kg  Vital Signs: Temp:  [98.1 F (36.7 C)-98.6 F (37 C)] 98.1 F (36.7 C) (04/08 2121) Pulse Rate:  [72-87] 72  (04/09 0000) Resp:  [12-18] 15  (04/09 0000) BP: (113-149)/(51-77) 139/64 mmHg (04/09 0000) SpO2:  [94 %-100 %] 99 % (04/09 0000) Weight:  [180 lb (81.647 kg)-181 lb 7 oz (82.3 kg)] 181 lb 7 oz (82.3 kg) (04/08 2121)  Intake/Output from previous day:  Intake/Output Summary (Last 24 hours) at 10/21/11 0039 Last data filed at 10/21/11 0000  Gross per 24 hour  Intake 262.61 ml  Output    470 ml  Net -207.39 ml    Labs:  Basename 10/20/11 1740 10/20/11 1649  HGB -- 11.8*  HCT -- 35.6*  PLT -- 162  APTT 28 --  LABPROT 13.0 --  INR 0.96 --  HEPARINUNFRC -- --  CREATININE -- 0.96  CKTOTAL -- --  CKMB -- --  TROPONINI <0.30 --   Estimated Creatinine Clearance: 63.4 ml/min (by C-G formula based on Cr of 0.96).  Medical History: Past Medical History  Diagnosis Date  . Coronary artery disease     s/p CABG, s/p stenting to distal LCx, s/p stent to proximal SVG-LCx in 05/2010,  NSTEMI in 04/2011 with cath showing occluded OM2 graft   . Diabetes mellitus   . Hypertension   . PUD (peptic ulcer disease)   . Hypercholesteremia   . Myocardial infarct, old   . GERD (gastroesophageal reflux disease)   . Arthritis   . Osteoarthritis   . Stroke   . Pneumonia     Scheduled medications:     . aspirin  324 mg Oral NOW   Or  . aspirin  300 mg Rectal NOW  . aspirin  325 mg Oral Daily  . atorvastatin  10 mg Oral q1800  . carvedilol  6.25 mg Oral BID WC  . clopidogrel  75 mg Oral Q breakfast  . heparin  1,000 Units/hr Intravenous Once  . heparin  4,000 Units  Intravenous Once  . insulin aspart  0-15 Units Subcutaneous TID WC  . isosorbide mononitrate  30 mg Oral Daily  . loratadine  10 mg Oral Daily  . morphine  4 mg Intravenous Once  . nitrofurantoin  100 mg Oral QHS  . nitroGLYCERIN  5-200 mcg/min Intravenous Once  . ondansetron (ZOFRAN) IV  4 mg Intravenous Once  . pantoprazole  40 mg Oral Daily  . ramipril  5 mg Oral Daily  . Tamsulosin HCl  0.4 mg Oral QHS     Assessment:  78 y.o. male transferred from [facility] for r/o ACS. Patient was started on heparin (4000 units x 1, then 1000 units/hr) at [facility]. Pharmacy consulted to manage heparin. Heparin currently infusing at 1000 units/hr  Goal of Therapy:  1. Heparin level 0.3-0.7 units/ml  Plan:  1. Continue IV heparin at 1000 units/hr.  2. Heparin level at 0230 (8 hours from start) 3. Daily CBC, heparin level  Onalee Hua C. Tianah Lonardo, PharmD 10/21/2011, 12:39 AM   Addendum: Heparin level (0.34) is at-goal. Will continue heparin at 1000 units/hr, and obtain confirmatory level in 8 hours.   Lorre Munroe, PharmD 10/21/11, 02:20

## 2011-10-21 NOTE — Progress Notes (Signed)
*  PRELIMINARY RESULTS* Echocardiogram 2D Echocardiogram has been performed.  [name] 10/21/2011, 11:51 AM

## 2011-10-22 LAB — CBC
MCH: 30.3 pg (ref 26.0–34.0)
Platelets: 145 10*3/uL — ABNORMAL LOW (ref 150–400)
RBC: 4.29 MIL/uL (ref 4.22–5.81)
WBC: 5.6 10*3/uL (ref 4.0–10.5)

## 2011-10-22 LAB — GLUCOSE, CAPILLARY

## 2011-10-22 LAB — BASIC METABOLIC PANEL
CO2: 25 mEq/L (ref 19–32)
Calcium: 9.2 mg/dL (ref 8.4–10.5)
Chloride: 101 mEq/L (ref 96–112)
Potassium: 4 mEq/L (ref 3.5–5.1)
Sodium: 135 mEq/L (ref 135–145)

## 2011-10-22 NOTE — Progress Notes (Signed)
DC orders received.  Patient stable with no S/S of distress. Medication and discharge information reviewed with patient.  Patient DC home. Phillips, Jeffrey [name]  

## 2011-10-22 NOTE — Discharge Summary (Signed)
Discharge Summary   Patient ID: Jeffrey [name],  MRN: [id number], DOB/AGE: January 05, 1929 78 y.o.  Admit date: 10/20/2011 Discharge date: 10/22/2011  Discharge Diagnoses Principal Problem:  *Near syncope Active Problems:  HYPERLIPIDEMIA, MIXED  GERD  HTN (hypertension)   Allergies No Known Allergies  Diagnostic Studies/Procedures  PORTABLE CHEST - 1 VIEW- 10/20/11  Comparison: 07/30/2011  Findings: Coronary bypass changes evident. Normal heart size and  vascularity. Minimal left lower lobe atelectasis / scarring.  Negative for CHF, pneumonia, collapse, consolidation, effusion or  pneumothorax. Trachea midline. Stable exam.  IMPRESSION:  Stable portable chest exam. No superimposed acute process  2D ECHOCARDIOGRAM WITHOUT CONTRAST- 10/21/11  Study Conclusions  - Left ventricle: The cavity size was normal. Wall thickness was normal. The estimated ejection fraction was 55%. - Aortic valve: Moderately calcified annulus. Moderately calcified leaflets. - Mitral valve: Calcified annulus. - Left atrium: The atrium was mildly dilated.  ------------------------------------------------------------ Labs, prior tests, procedures, and surgery: Coronary artery bypass grafting.  Coronary artery bypass grafting. Transthoracic echocardiography. M-mode, complete 2D, spectral Doppler, and color Doppler. Height: Height: 177.8cm. Height: 70in. Weight: Weight: 82.1kg. Weight: 180.6lb. Body mass index: BMI: 26kg/m^2. Body surface area: BSA: 19m^2. Blood pressure: 162/59. Patient status: Inpatient. Location: Bedside.  ------------------------------------------------------------  ------------------------------------------------------------ Left ventricle: The cavity size was normal. Wall thickness was normal. The estimated ejection fraction was 55%.  ------------------------------------------------------------ Aortic valve: Moderately calcified annulus. Moderately calcified  leaflets.  ------------------------------------------------------------ Aorta: The aorta was normal, not dilated, and non-diseased.  ------------------------------------------------------------ Mitral valve: Calcified annulus. Doppler: Trivial regurgitation. Peak gradient: 3mm Hg (D).  ------------------------------------------------------------ Left atrium: The atrium was mildly dilated.  ------------------------------------------------------------ Atrial septum: Poorly visualized.  ------------------------------------------------------------ Right ventricle: The cavity size was normal. Wall thickness was normal. Systolic function was normal.  ------------------------------------------------------------ Pulmonic valve: Doppler: Trivial regurgitation.  ------------------------------------------------------------ Tricuspid valve: Doppler: Mild regurgitation.  ------------------------------------------------------------ Right atrium: The atrium was normal in size.  ------------------------------------------------------------ Pericardium: The pericardium was normal in appearance.  ------------------------------------------------------------ Post procedure conclusions Ascending Aorta:  - The aorta was normal, not dilated, and non-diseased.  ------------------------------------------------------------  2D measurements Normal Doppler Normal Left ventricle measurements LVID ED, 41.4 mm 43-52 Left ventricle chord, Ea, lat 10.5 cm/ ------- PLAX ann, tiss s LVID ES, 35.7 mm 23-38 DP chord, E/Ea, lat 8.5 ------- PLAX ann, tiss FS, chord, 14 % >29 DP PLAX Ea, med 5.7 cm/ ------- LVPW, ED 14.5 mm ------ ann, tiss s IVS/LVPW 0.75 <1.3 DP ratio, ED E/Ea, med 15.67 ------- Ventricular septum ann, tiss IVS, ED 10.9 mm ------ DP Aorta Mitral valve Root diam, 35 mm ------ Peak E vel 89.3 cm/ ------- ED s Left atrium Peak A vel 119 cm/ ------- AP dim 53 mm ------ s AP dim 2.65  cm/m^2 <2.2 Deceleratio 236 ms 150-230 index n time Peak 3 mm ------- gradient, D Hg Peak E/A 0.8 ------- ratio    History of Present Illness  Jeffrey [name] is a 78yo male with PMHx significant for CAD (s/p remote CABG and multiple PCIs), type 2 DM, HTN and HL who was admitted to [facility] with presyncope and borderline elevated troponin and subsequently transferred to [facility] hospital.   He had reportedly was standing in line for a license and felt a sense of indigestion. He then reports feeling "bad" with associated diaphoresis and presyncope. This resolved with rest. He presented to [facility]. POC TnI was mildly elevated at 0.16. CXR as above. He was subsequently started on heparin and transferred to [facility]. He denied history of syncope in the past.  Hospital Course   He was subsequently admitted to [facility]. Home meds were continued including ASA/Plavix/Coreg/Imdur. Heparin IV was continued. He was covered with SSI.   He remained chest pain free overnight with normal troponins. A 2D echocardiogram was ordered revealing the above details. This notably revealed preserved LVEF and mild LAE.   His HPI was consistent with a vasovagal etiology. He ruled out for ACS and remained asymptomatic. There were no events overnights on telemetry or EKG.   Today, he is stable, in good condition and will be discharged home. He will resume all outpatient meds. He will follow-up at the [facility] office in 6-8 weeks. This information has been clearly outlined in the discharge AVS.   Discharge Vitals:  Blood pressure 135/70, pulse 68, temperature 98.7 F (37.1 C), temperature source Oral, resp. rate 18, height 5\' 10"  (1.778 m), weight 82.3 kg (181 lb 7 oz), SpO2 95.00%.   Labs: Recent Labs  Basename 10/22/11 0535 10/21/11 0105   WBC 5.6 6.7   HGB 13.0 11.9*   HCT 38.9* 35.6*   MCV 90.7 90.4   PLT 145* 155    Lab 10/22/11 0535 10/20/11 1649  NA 135 135  K 4.0 4.2  CL 101 101  CO2 25 24  BUN 12  17  CREATININE 0.79 0.96  CALCIUM 9.2 9.7  PROT -- --  BILITOT -- --  ALKPHOS -- --  ALT -- --  AST -- --  AMYLASE -- --  LIPASE -- --  GLUCOSE 182* 132*   Recent Labs  Basename 10/21/11 0105   HGBA1C 8.1*   Recent Labs  Basename 10/21/11 1210 10/21/11 0610 10/21/11 0058   CKTOTAL 455* 544* 546*   CKMB 11.7* 13.5* 12.9*   CKMBINDEX -- -- --   TROPONINI <0.30 <0.30 <0.30    Recent Labs  Basename 10/21/11 0610   CHOL 162   HDL 33*   LDLCALC 59   TRIG 348*   CHOLHDL 4.9   LDLDIRECT --   Disposition:  Discharge Orders    Future Appointments: Provider: Department: Dept Phone: Center:   12/04/2011 11:20 AM Jodelle Gross, NP [facility] [phone] [facility]     Follow-up Information    Follow up with [facility] on 12/04/2011. (At 11:30 AM for follow-up after this hospitalization. )    Contact information:   [address] [place] [address]          Discharge Medications:  Medication List  As of 10/22/2011 12:20 PM   CONTINUE taking these medications         aspirin 325 MG tablet      aspirin-sod bicarb-citric acid 325 MG Tbef   Commonly known as: ALKA-SELTZER      calcium carbonate 500 MG chewable tablet   Commonly known as: TUMS - dosed in mg elemental calcium      carvedilol 6.25 MG tablet   Commonly known as: COREG      clopidogrel 75 MG tablet   Commonly known as: PLAVIX   Take 1 tablet (75 mg total) by mouth daily.      COD LIVER OIL PO      isosorbide mononitrate 30 MG 24 hr tablet   Commonly known as: IMDUR   Take 1 tablet (30 mg total) by mouth daily.      loratadine 10 MG tablet   Commonly known as: CLARITIN      meclizine 25 MG tablet   Commonly known as: Pharmacist, community  metFORMIN 500 MG tablet   Commonly known as: GLUCOPHAGE      nitrofurantoin 100 MG capsule   Commonly known as: MACRODANTIN      nitroGLYCERIN 0.4 MG SL tablet   Commonly known as: NITROSTAT      ONGLYZA 5 MG  Tabs tablet   Generic drug: saxagliptin HCl      pantoprazole 40 MG tablet   Commonly known as: PROTONIX      PEPTO-BISMOL PO      ramipril 5 MG capsule   Commonly known as: ALTACE      rosuvastatin 10 MG tablet   Commonly known as: CRESTOR      STOOL SOFTENER 100 MG capsule   Generic drug: Docusate Sodium      Tamsulosin HCl 0.4 MG Caps   Commonly known as: FLOMAX           utstanding Labs/Studies: None  Duration of Discharge Encounter: Greater than 30 minutes including physician time.  Signed, R. Hurman Horn, PA-C 10/22/2011, 12:20 PM

## 2011-10-22 NOTE — Progress Notes (Signed)
UR Completed. Simmons, Yehudit Fulginiti F [phone]  

## 2011-10-22 NOTE — Discharge Instructions (Signed)
PLEASE REMEMBER TO BRING ALL OF YOUR MEDICATIONS TO EACH OF YOUR FOLLOW-UP OFFICE VISITS.  PLEASE ATTEND ALL SCHEDULED APPOINTMENTS.

## 2011-10-22 NOTE — Progress Notes (Signed)
Patient Name: Jeffrey [name]      SUBJECTIVE: admitted with syncope/near syncope with borderline troponin at [facility]; normal here at [facility]  S/p CABG aand cathed most recently 1`0/12 for small NSTEMI with demonstrated 100% RCA, 100% Cx, 80% LAD, chronically occluded SVG-RCA, LIMA-LAD patent, and patent SVG to Cx however, the distal stent was occluded  Echo 4/13>> normal LV function with mild LAE  Past Medical History  Diagnosis Date  . Coronary artery disease     s/p CABG, s/p stenting to distal LCx, s/p stent to proximal SVG-LCx in 05/2010,  NSTEMI in 04/2011 with cath showing occluded OM2 graft   . Diabetes mellitus   . Hypertension   . PUD (peptic ulcer disease)   . Hypercholesteremia   . Myocardial infarct, old   . GERD (gastroesophageal reflux disease)   . Arthritis   . Osteoarthritis   . Stroke   . Pneumonia     PHYSICAL EXAM Filed Vitals:   10/21/11 1850 10/21/11 2100 10/22/11 0500 10/22/11 0900  BP: 131/70 136/68 157/76 136/73  Pulse: 66 63 68 73  Temp: 98.1 F (36.7 C) 98.5 F (36.9 C) 98.7 F (37.1 C)   TempSrc: Oral Oral Oral   Resp: 18 18 18    Height:      Weight:      SpO2: 95% 98% 95%     Well developed and nourished in no acute distress HENT normal Neck supple with JVP-flat Clear Regular rate and rhythm, no murmurs or gallops Abd-soft with active BS No Clubbing cyanosis edema Skin-warm and dry A & Oriented  Grossly normal sensory and motor function HOH  Tele: NSR   Intake/Output Summary (Last 24 hours) at 10/22/11 0949 Last data filed at 10/22/11 0900  Gross per 24 hour  Intake 1087.5 ml  Output   1951 ml  Net -863.5 ml    LABS: Basic Metabolic Panel:  Lab 10/22/11 3235 10/20/11 1649  NA 135 135  K 4.0 4.2  CL 101 101  CO2 25 24  GLUCOSE 182* 132*  BUN 12 17  CREATININE 0.79 0.96  CALCIUM 9.2 9.7  MG -- --  PHOS -- --   Cardiac Enzymes:  Basename 10/21/11 1210 10/21/11 0610 10/21/11 0058  CKTOTAL 455* 544* 546*  CKMB 11.7*  13.5* 12.9*  CKMBINDEX -- -- --  TROPONINI <0.30 <0.30 <0.30   CBC:  Lab 10/22/11 0535 10/21/11 0105 10/20/11 1740 10/20/11 1649  WBC 5.6 6.7 -- 6.6  NEUTROABS -- -- 3.9 --  HGB 13.0 11.9* -- 11.8*  HCT 38.9* 35.6* -- 35.6*  MCV 90.7 90.4 -- 90.4  PLT 145* 155 -- 162   PROTIME:  Basename 10/20/11 1740  LABPROT 13.0  INR 0.96   Liver Function Tests: No results found for this basename: AST:2,ALT:2,ALKPHOS:2,BILITOT:2,PROT:2,ALBUMIN:2 in the last 72 hours No results found for this basename: LIPASE:2,AMYLASE:2 in the last 72 hours BNP: No components found with this basename: POCBNP:3 D-Dimer: No results found for this basename: DDIMER:2 in the last 72 hours Hemoglobin A1C:  Basename 10/21/11 0105  HGBA1C 8.1*   Fasting Lipid Panel:  Basename 10/21/11 0610  CHOL 162  HDL 33*  LDLCALC 59  TRIG 573*  CHOLHDL 4.9  LDLDIRECT --     ASSESSMENT AND PLAN:  Patient Active Hospital Problem List: Near syncope (10/21/2011)  presumed by Dr Fawn Kirk vagal   ez negative and LV function normal  Ambulate and discharge to home, no med changes    F[facility]u [facility] 6-8 weeks  Signed, Sherryl Manges MD  10/22/2011

## 2011-10-24 ENCOUNTER — Other Ambulatory Visit: Payer: Self-pay

## 2011-10-24 ENCOUNTER — Inpatient Hospital Stay (HOSPITAL_COMMUNITY)
Admission: EM | Admit: 2011-10-24 | Discharge: 2011-10-27 | DRG: 281 | Disposition: A | Discharge status: Home Health | Payer: Medicare Other | Source: Ambulatory Visit | Attending: Internal Medicine | Admitting: Internal Medicine

## 2011-10-24 ENCOUNTER — Emergency Department (HOSPITAL_COMMUNITY): Payer: Medicare Other

## 2011-10-24 ENCOUNTER — Encounter: Payer: Self-pay | Admitting: Nurse Practitioner

## 2011-10-24 ENCOUNTER — Encounter (HOSPITAL_COMMUNITY): Payer: Self-pay | Admitting: Emergency Medicine

## 2011-10-24 DIAGNOSIS — K279 Peptic ulcer, site unspecified, unspecified as acute or chronic, without hemorrhage or perforation: Secondary | ICD-10-CM | POA: Diagnosis present

## 2011-10-24 DIAGNOSIS — M129 Arthropathy, unspecified: Secondary | ICD-10-CM | POA: Diagnosis present

## 2011-10-24 DIAGNOSIS — Z79899 Other long term (current) drug therapy: Secondary | ICD-10-CM

## 2011-10-24 DIAGNOSIS — E785 Hyperlipidemia, unspecified: Secondary | ICD-10-CM | POA: Diagnosis present

## 2011-10-24 DIAGNOSIS — Z9861 Coronary angioplasty status: Secondary | ICD-10-CM

## 2011-10-24 DIAGNOSIS — R7989 Other specified abnormal findings of blood chemistry: Secondary | ICD-10-CM

## 2011-10-24 DIAGNOSIS — Z951 Presence of aortocoronary bypass graft: Secondary | ICD-10-CM

## 2011-10-24 DIAGNOSIS — Z7982 Long term (current) use of aspirin: Secondary | ICD-10-CM

## 2011-10-24 DIAGNOSIS — E7800 Pure hypercholesterolemia, unspecified: Secondary | ICD-10-CM | POA: Diagnosis present

## 2011-10-24 DIAGNOSIS — I2582 Chronic total occlusion of coronary artery: Secondary | ICD-10-CM | POA: Diagnosis present

## 2011-10-24 DIAGNOSIS — Z8673 Personal history of transient ischemic attack (TIA), and cerebral infarction without residual deficits: Secondary | ICD-10-CM

## 2011-10-24 DIAGNOSIS — F17200 Nicotine dependence, unspecified, uncomplicated: Secondary | ICD-10-CM | POA: Diagnosis present

## 2011-10-24 DIAGNOSIS — Z6825 Body mass index (BMI) 25.0-25.9, adult: Secondary | ICD-10-CM

## 2011-10-24 DIAGNOSIS — I2510 Atherosclerotic heart disease of native coronary artery without angina pectoris: Secondary | ICD-10-CM | POA: Diagnosis present

## 2011-10-24 DIAGNOSIS — I10 Essential (primary) hypertension: Secondary | ICD-10-CM | POA: Diagnosis present

## 2011-10-24 DIAGNOSIS — T82897A Other specified complication of cardiac prosthetic devices, implants and grafts, initial encounter: Secondary | ICD-10-CM | POA: Diagnosis present

## 2011-10-24 DIAGNOSIS — IMO0001 Reserved for inherently not codable concepts without codable children: Secondary | ICD-10-CM | POA: Diagnosis present

## 2011-10-24 DIAGNOSIS — Y849 Medical procedure, unspecified as the cause of abnormal reaction of the patient, or of later complication, without mention of misadventure at the time of the procedure: Secondary | ICD-10-CM | POA: Diagnosis present

## 2011-10-24 DIAGNOSIS — Z7902 Long term (current) use of antithrombotics/antiplatelets: Secondary | ICD-10-CM

## 2011-10-24 DIAGNOSIS — F0390 Unspecified dementia without behavioral disturbance: Secondary | ICD-10-CM | POA: Diagnosis present

## 2011-10-24 DIAGNOSIS — I214 Non-ST elevation (NSTEMI) myocardial infarction: Principal | ICD-10-CM | POA: Diagnosis present

## 2011-10-24 LAB — BASIC METABOLIC PANEL
BUN: 15 mg/dL (ref 6–23)
CO2: 23 mEq/L (ref 19–32)
Calcium: 9.4 mg/dL (ref 8.4–10.5)
Creatinine, Ser: 0.81 mg/dL (ref 0.50–1.35)
Glucose, Bld: 220 mg/dL — ABNORMAL HIGH (ref 70–99)

## 2011-10-24 LAB — URINALYSIS, ROUTINE W REFLEX MICROSCOPIC
Bilirubin Urine: NEGATIVE
Glucose, UA: 250 mg/dL — AB
Hgb urine dipstick: NEGATIVE
Leukocytes, UA: NEGATIVE
pH: 5.5 (ref 5.0–8.0)

## 2011-10-24 LAB — PROTIME-INR: Prothrombin Time: 13 seconds (ref 11.6–15.2)

## 2011-10-24 LAB — DIFFERENTIAL
Eosinophils Absolute: 0.3 10*3/uL (ref 0.0–0.7)
Eosinophils Relative: 4 % (ref 0–5)
Lymphocytes Relative: 16 % (ref 12–46)
Lymphs Abs: 1.1 10*3/uL (ref 0.7–4.0)
Monocytes Absolute: 0.6 10*3/uL (ref 0.1–1.0)

## 2011-10-24 LAB — CARDIAC PANEL(CRET KIN+CKTOT+MB+TROPI)
CK, MB: 17.8 ng/mL (ref 0.3–4.0)
Troponin I: 2.68 ng/mL (ref <0.30)

## 2011-10-24 LAB — CBC
HCT: 36.9 % — ABNORMAL LOW (ref 39.0–52.0)
MCH: 30.2 pg (ref 26.0–34.0)
MCV: 90 fL (ref 78.0–100.0)
RBC: 4.1 MIL/uL — ABNORMAL LOW (ref 4.22–5.81)
WBC: 6.8 10*3/uL (ref 4.0–10.5)

## 2011-10-24 LAB — GLUCOSE, CAPILLARY: Glucose-Capillary: 126 mg/dL — ABNORMAL HIGH (ref 70–99)

## 2011-10-24 MED ORDER — ACETAMINOPHEN 650 MG RE SUPP: 650.0000 mg | Freq: Four times a day (QID) | RECTAL | Status: DC | PRN

## 2011-10-24 MED ORDER — SODIUM CHLORIDE 0.9 % IV SOLN
INTRAVENOUS | Status: DC
  Administered 2011-10-24: 17:00:00 via INTRAVENOUS

## 2011-10-24 MED ORDER — RAMIPRIL 5 MG PO CAPS
5.0000 mg | ORAL_CAPSULE | Freq: Every day | ORAL | Status: DC
  Administered 2011-10-25 – 2011-10-27 (×3): 5 mg via ORAL
  Filled 2011-10-24 (×3): qty 1

## 2011-10-24 MED ORDER — HEPARIN (PORCINE) IN NACL 100-0.45 UNIT/ML-% IJ SOLN
12.0000 [IU]/kg/h | INTRAMUSCULAR | Status: DC
  Administered 2011-10-24: 12 [IU]/kg/h via INTRAVENOUS
  Filled 2011-10-24: qty 250

## 2011-10-24 MED ORDER — LINAGLIPTIN 5 MG PO TABS
5.0000 mg | ORAL_TABLET | Freq: Every day | ORAL | Status: DC
  Administered 2011-10-25 – 2011-10-27 (×3): 5 mg via ORAL
  Filled 2011-10-24 (×3): qty 1

## 2011-10-24 MED ORDER — SODIUM CHLORIDE 0.9 % IJ SOLN
3.0000 mL | Freq: Two times a day (BID) | INTRAMUSCULAR | Status: DC
  Administered 2011-10-24 – 2011-10-27 (×6): 3 mL via INTRAVENOUS

## 2011-10-24 MED ORDER — PANTOPRAZOLE SODIUM 40 MG PO TBEC: 40.0000 mg | DELAYED_RELEASE_TABLET | Freq: Every day | ORAL | Status: DC

## 2011-10-24 MED ORDER — PANTOPRAZOLE SODIUM 40 MG PO TBEC
40.0000 mg | DELAYED_RELEASE_TABLET | Freq: Every day | ORAL | Status: DC
  Administered 2011-10-25 – 2011-10-27 (×3): 40 mg via ORAL
  Filled 2011-10-24: qty 1

## 2011-10-24 MED ORDER — ACETAMINOPHEN 325 MG PO TABS
650.0000 mg | ORAL_TABLET | Freq: Four times a day (QID) | ORAL | Status: DC | PRN
  Administered 2011-10-26 – 2011-10-27 (×2): 650 mg via ORAL
  Filled 2011-10-24 (×2): qty 2

## 2011-10-24 MED ORDER — MORPHINE SULFATE 2 MG/ML IJ SOLN
2.0000 mg | INTRAMUSCULAR | Status: DC | PRN
  Filled 2011-10-24: qty 1

## 2011-10-24 MED ORDER — CARVEDILOL 6.25 MG PO TABS
6.2500 mg | ORAL_TABLET | Freq: Two times a day (BID) | ORAL | Status: DC
  Administered 2011-10-24 – 2011-10-27 (×6): 6.25 mg via ORAL
  Filled 2011-10-24 (×8): qty 1

## 2011-10-24 MED ORDER — RAMIPRIL 5 MG PO CAPS
5.0000 mg | ORAL_CAPSULE | Freq: Every day | ORAL | Status: DC
  Filled 2011-10-24: qty 1

## 2011-10-24 MED ORDER — HEPARIN (PORCINE) IN NACL 100-0.45 UNIT/ML-% IJ SOLN
1000.0000 [IU]/h | INTRAMUSCULAR | Status: DC
  Filled 2011-10-24 (×2): qty 250

## 2011-10-24 MED ORDER — NITROFURANTOIN MACROCRYSTAL 100 MG PO CAPS
100.0000 mg | ORAL_CAPSULE | Freq: Every day | ORAL | Status: DC
  Administered 2011-10-24 – 2011-10-26 (×3): 100 mg via ORAL
  Filled 2011-10-24 (×4): qty 1

## 2011-10-24 MED ORDER — LINAGLIPTIN 5 MG PO TABS
5.0000 mg | ORAL_TABLET | Freq: Every day | ORAL | Status: DC
  Filled 2011-10-24: qty 1

## 2011-10-24 MED ORDER — NITROGLYCERIN 0.4 MG SL SUBL: 0.4000 mg | SUBLINGUAL_TABLET | SUBLINGUAL | Status: DC | PRN

## 2011-10-24 MED ORDER — SODIUM CHLORIDE 0.9 % IV SOLN
INTRAVENOUS | Status: DC
  Administered 2011-10-24: 09:00:00 via INTRAVENOUS

## 2011-10-24 MED ORDER — SIMVASTATIN 20 MG PO TABS
20.0000 mg | ORAL_TABLET | Freq: Every day | ORAL | Status: DC
  Administered 2011-10-24 – 2011-10-26 (×3): 20 mg via ORAL
  Filled 2011-10-24 (×4): qty 1

## 2011-10-24 MED ORDER — INSULIN ASPART 100 UNIT/ML ~~LOC~~ SOLN
0.0000 [IU] | Freq: Three times a day (TID) | SUBCUTANEOUS | Status: DC
  Administered 2011-10-24: 1 [IU] via SUBCUTANEOUS
  Administered 2011-10-25 (×3): 2 [IU] via SUBCUTANEOUS
  Administered 2011-10-26: 3 [IU] via SUBCUTANEOUS
  Administered 2011-10-26: 2 [IU] via SUBCUTANEOUS
  Administered 2011-10-26: 3 [IU] via SUBCUTANEOUS
  Administered 2011-10-27 (×2): 2 [IU] via SUBCUTANEOUS

## 2011-10-24 MED ORDER — TAMSULOSIN HCL 0.4 MG PO CAPS
0.4000 mg | ORAL_CAPSULE | Freq: Every day | ORAL | Status: DC
  Administered 2011-10-24 – 2011-10-26 (×3): 0.4 mg via ORAL
  Filled 2011-10-24 (×4): qty 1

## 2011-10-24 MED ORDER — ASPIRIN 81 MG PO CHEW
324.0000 mg | CHEWABLE_TABLET | Freq: Once | ORAL | Status: AC
  Administered 2011-10-24: 162 mg via ORAL
  Filled 2011-10-24: qty 4

## 2011-10-24 MED ORDER — HEPARIN BOLUS VIA INFUSION
4000.0000 [IU] | Freq: Once | INTRAVENOUS | Status: AC
  Administered 2011-10-24: 4000 [IU] via INTRAVENOUS

## 2011-10-24 MED ORDER — CLOPIDOGREL BISULFATE 75 MG PO TABS
75.0000 mg | ORAL_TABLET | Freq: Every day | ORAL | Status: DC
  Administered 2011-10-25 – 2011-10-26 (×2): 75 mg via ORAL
  Filled 2011-10-24 (×3): qty 1

## 2011-10-24 MED ORDER — CARVEDILOL 6.25 MG PO TABS
6.2500 mg | ORAL_TABLET | Freq: Two times a day (BID) | ORAL | Status: DC
  Filled 2011-10-24 (×2): qty 1

## 2011-10-24 MED ORDER — ASPIRIN EC 325 MG PO TBEC
325.0000 mg | DELAYED_RELEASE_TABLET | Freq: Every day | ORAL | Status: DC
  Administered 2011-10-25 – 2011-10-26 (×2): 325 mg via ORAL
  Filled 2011-10-24 (×4): qty 1

## 2011-10-24 MED ORDER — ISOSORBIDE MONONITRATE ER 30 MG PO TB24
30.0000 mg | ORAL_TABLET | Freq: Every day | ORAL | Status: DC
  Administered 2011-10-25 – 2011-10-27 (×3): 30 mg via ORAL
  Filled 2011-10-24 (×4): qty 1

## 2011-10-24 NOTE — H&P (Addendum)
PCP:   Jeffrey [name]., MD, MD   Chief Complaint:  Weakness.  HPI: Jeffrey [name] was transferred from [facility] this afternoon for a positive troponin. He is a poor historian as he cannot even remember the reason for coming to the ED. However, he recalls that he was admitted on Monday after he felt faint and a troponin done at [facility] was positive. Per records, troponin remained negative at [facility], although ckmb and cpk were elevated. A 2decho was unremarkable, and no further tests were done. The patient has extensive coronary disease with coronary stents placed as late as last year. Today says he has been feeling weak in the last 6 months, more so in the last 2 days, when he felt diaphoretic. He denies chest pain. His troponin was 1 today, with report of ST depressions(I have not seen the EKG today, and just ordered another one). Cardiology had been contacted by Dr Clarene Duke and recommended admission to hospitalist service. Patient says he takes his ASA and plavix religiously, and says he was told one of the clogged arteries was not fixed when he had cardiac cath last year. Unfortunately, his medical records have not been merged yet hence difficult to follow story. Jeffrey [name] is currently chest pain free.  Review of Systems:  The patient denies anorexia, fever, weight loss,, vision loss, decreased hearing, hoarseness, chest pain, syncope, dyspnea on exertion, peripheral edema, balance deficits, hemoptysis, abdominal pain, melena, hematochezia, severe indigestion/heartburn, hematuria, incontinence, genital sores, muscle weakness, suspicious skin lesions, transient blindness, difficulty walking, depression, unusual weight change, abnormal bleeding, enlarged lymph nodes, angioedema, and breast masses.  Past Medical History: Past Medical History  Diagnosis Date  . Hypertension   . Diabetes mellitus   . PUD (peptic ulcer disease)   . Coronary artery disease     s/p CABG, s/p stenting to distal LCx, s/p stent to  proximal SVG-LCx in 05/2010,  NSTEMI in 04/2011 with cath showing occluded OM2 graft    . Hypercholesterolemia   . Myocardial infarct, old   . GERD (gastroesophageal reflux disease)   . Arthritis   . Stroke    Past Surgical History  Procedure Date  . Coronary artery bypass graft   . Tonsillectomy   . Coronary angioplasty with stent placement     Medications: Prior to Admission medications   Medication Sig Start Date End Date Taking? Authorizing Provider  aspirin EC 325 MG tablet Take 325 mg by mouth daily.   Yes Historical Provider, MD  carvedilol (COREG) 6.25 MG tablet Take 6.25 mg by mouth 2 (two) times daily with a meal.   Yes Historical Provider, MD  clopidogrel (PLAVIX) 75 MG tablet Take 75 mg by mouth daily.   Yes Historical Provider, MD  isosorbide mononitrate (IMDUR) 30 MG 24 hr tablet Take 1 tablet (30 mg total) by mouth daily. 08/04/11 08/03/12 Yes Jodelle Gross, NP  metFORMIN (GLUCOPHAGE) 500 MG tablet Take 500 mg by mouth 2 (two) times daily with a meal.   Yes Historical Provider, MD  nitrofurantoin (MACRODANTIN) 100 MG capsule Take 100 mg by mouth at bedtime.   Yes Historical Provider, MD  pantoprazole (PROTONIX) 40 MG tablet Take 40 mg by mouth daily.   Yes Historical Provider, MD  ramipril (ALTACE) 5 MG capsule Take 5 mg by mouth daily.   Yes Historical Provider, MD  saxagliptin HCl (ONGLYZA) 5 MG TABS tablet Take 5 mg by mouth daily.   Yes Historical Provider, MD  Tamsulosin HCl (FLOMAX) 0.4 MG CAPS Take 0.4  mg by mouth at bedtime.   Yes Historical Provider, MD    Allergies:  No Known Allergies  Social History:  reports that he has quit smoking. His smokeless tobacco use includes Chew. He reports that he does not drink alcohol or use illicit drugs.   Family History: History reviewed. No pertinent family history.  Physical Exam: Filed Vitals:   10/24/11 1100 10/24/11 1200 10/24/11 1300 10/24/11 1453  BP: 113/61 111/55 128/68 136/83  Pulse: 78 74 72 76    Temp:    98.6 F (37 C)  TempSrc:      Resp: 16 18 18 18   Height:      Weight:    81.1 kg (178 lb 12.7 oz)  SpO2: 95% 100% 100% 100%   Lying comfortably in bed, not in distress. No JVD. No carotid bruits. Lungs clear. S1S2. RRR. No murmurs. Abdomen soft, non tender. +BS. CNS- forgetful, otherwise no acute findings. Extremities- no pedal edema.   Labs on Admission:   Basename 10/24/11 0900  NA 134*  K 4.3  CL 100  CO2 23  GLUCOSE 220*  BUN 15  CREATININE 0.81  CALCIUM 9.4  MG --  PHOS --   No results found for this basename: AST:2,ALT:2,ALKPHOS:2,BILITOT:2,PROT:2,ALBUMIN:2 in the last 72 hours No results found for this basename: LIPASE:2,AMYLASE:2 in the last 72 hours  Basename 10/24/11 0900  WBC 6.8  NEUTROABS 4.8  HGB 12.4*  HCT 36.9*  MCV 90.0  PLT 166    Basename 10/24/11 0900  CKTOTAL --  CKMB --  CKMBINDEX --  TROPONINI 1.00*   No results found for this basename: TSH,T4TOTAL,FREET3,T3FREE,THYROIDAB in the last 72 hours No results found for this basename: VITAMINB12:2,FOLATE:2,FERRITIN:2,TIBC:2,IRON:2,RETICCTPCT:2 in the last 72 hours  Radiological Exams on Admission: Dg Chest 2 View  10/24/2011  *RADIOLOGY REPORT*  Clinical Data: Cough, CHF.  CHEST - 2 VIEW  Comparison: None  Findings: There is hyperinflation of the lungs compatible with COPD.  Prior CABG. Heart and mediastinal contours are within normal limits.  No focal opacities or effusions.  No acute bony abnormality.  IMPRESSION: COPD.  No active disease.  Original Report Authenticated By: Cyndie Chime, M.D.    Assessment Positive troponin, concerning for NSTEMI in patient with significant CAD history, and is diabetic. Plan  .NSTEMI (non-ST elevated myocardial infarction)- admit tele. Cycle enzymes. Stat EKG. Consult cardiology once cardiac panel back, if positive. Iv heparin/asa/plavix/beta blocker/statin/ntg as necessary. Will not order echo as one done few days ago. Check tsh. .DM (diabetes  mellitus), type 2, uncontrolled- hb a1c 8.1. Hold metformin. ssi. Marland KitchenHTN (hypertension)- resume home  meds. .Hyperlipidemia- start zocor. .Dementia- ? Baseline. Monitor. Condition guarded.   Shetara Launer [phone]. 10/24/2011, 4:06 PM

## 2011-10-24 NOTE — ED Notes (Signed)
CRITICAL VALUE ALERT  Critical value received:  Troponin  Date of notification:  10/24/11  Time of notification:  0950  Critical value read back:yes  Nurse who received alert:  Lake Bells, RN  MD notified (1st page):  Dr Clarene Duke [phone]  Verbal order for 2nd IV received.

## 2011-10-24 NOTE — ED Notes (Signed)
[facility] at bedside to transport patient. 

## 2011-10-24 NOTE — ED Notes (Signed)
Pt c/o being weak since having a heart cath on Wednesday. Pt states he has been sweating due to his ac being broke. Pt states he has to go to [employer] to cool off. Pt states his body is tingling.

## 2011-10-24 NOTE — Progress Notes (Signed)
Patient ruled in for MI. Have discussed with Dr Myrtis Ser, who will graciously see patient later tonight.  Conley Canal, MD [phone].

## 2011-10-24 NOTE — ED Notes (Signed)
Report given to Sam, RN with [facility]. Awaiting transportation.

## 2011-10-24 NOTE — Progress Notes (Signed)
CRITICAL VALUE ALERT  Critical value received:  Results for BREN, STEERS (MRN [id number]) as of 10/24/2011 17:53  Ref. Range 10/24/2011 16:32 10/24/2011 16:33  CK, MB Latest Range: 0.3-4.0 ng/mL 17.8 (HH)   CK Total Latest Range: 7-232 U/L 458 (H)   Troponin I Latest Range: <0.30 ng/mL 2.68 (HH)   Heparin Unfractionated Latest Range: 0.30-0.70 IU/mL  0.39    Date of notification: 10/24/11  Time of notification:5:58 PM  Critical value read back:yes  Nurse who received alert:  Ernesto Rutherford  MD notified (1st page):  Dr. Venetia Constable  Time of first page:  5:59 PM  MD notified (2nd page):  Time of second page:  Responding MD:    Time MD responded:

## 2011-10-24 NOTE — ED Notes (Signed)
Report given to receiving RN unit 2000 at [facility]

## 2011-10-24 NOTE — ED Notes (Signed)
Patient left ED with [facility]. NAD noted upon departure. 

## 2011-10-24 NOTE — Progress Notes (Signed)
ANTICOAGULATION CONSULT NOTE - Follow Up Consult  Pharmacy Consult for Heparin Indication: NSTEMI  No Known Allergies  Patient Measurements: Height: 5\' 10"  (177.8 cm) Weight: 178 lb 12.7 oz (81.1 kg) IBW/kg (Calculated) : 73  Heparin Dosing Weight: 81 kg  Vital Signs: Temp: 98.6 F (37 C) (04/12 1453) BP: 136/83 mmHg (04/12 1453) Pulse Rate: 76  (04/12 1453)  Labs:  Basename 10/24/11 1633 10/24/11 1632 10/24/11 1040 10/24/11 0900  HGB -- -- -- 12.4*  HCT -- -- -- 36.9*  PLT -- -- -- 166  APTT -- -- -- --  LABPROT -- -- 13.0 --  INR -- -- 0.96 --  HEPARINUNFRC 0.39 -- -- --  CREATININE -- -- -- 0.81  CKTOTAL -- 458* -- --  CKMB -- 17.8* -- --  TROPONINI -- 2.68* -- 1.00*   Estimated Creatinine Clearance: 75.1 ml/min (by C-G formula based on Cr of 0.81).   Medications:  Prescriptions prior to admission  Medication Sig Dispense Refill  . aspirin EC 325 MG tablet Take 325 mg by mouth daily.      . carvedilol (COREG) 6.25 MG tablet Take 6.25 mg by mouth 2 (two) times daily with a meal.      . clopidogrel (PLAVIX) 75 MG tablet Take 75 mg by mouth daily.      . isosorbide mononitrate (IMDUR) 30 MG 24 hr tablet Take 1 tablet (30 mg total) by mouth daily.  30 tablet  6  . metFORMIN (GLUCOPHAGE) 500 MG tablet Take 500 mg by mouth 2 (two) times daily with a meal.      . nitrofurantoin (MACRODANTIN) 100 MG capsule Take 100 mg by mouth at bedtime.      . pantoprazole (PROTONIX) 40 MG tablet Take 40 mg by mouth daily.      . ramipril (ALTACE) 5 MG capsule Take 5 mg by mouth daily.      . saxagliptin HCl (ONGLYZA) 5 MG TABS tablet Take 5 mg by mouth daily.      . Tamsulosin HCl (FLOMAX) 0.4 MG CAPS Take 0.4 mg by mouth at bedtime.        Assessment: 78 yo M with hx CAD, presents from [facility] with CP and positive troponin.  Pt has now ruled in for NSTEMI, awaiting cardiology consult.  Heparin is therapeutic at 1000 units/hr.  Goal of Therapy:  Heparin level 0.3-0.7  units/ml   Plan:  Continue heparin at 1000 units/hr. Follow-up with AM heparin level and CBC. Follow-up cardiology recommendations/plans.  [employer], Pharm.D., BCPS Clinical Pharmacist Pager [phone] 10/24/2011 8:38 PM

## 2011-10-24 NOTE — ED Notes (Signed)
Attempted to call report to Unit [facility] at [facility]. Told to call back in 5 minutes. Will call back.

## 2011-10-24 NOTE — ED Provider Notes (Signed)
History   This chart was scribed for Laray Anger, DO by Sofie Rower. The patient was seen in room APA18/APA18 and the patient's care was started at 8:29 AM    CSN: [id number]  Arrival date & time 10/24/11  4098   First MD Initiated Contact with Patient 10/24/11 [phone]      Chief Complaint  Patient presents with  . Fatigue    HPI  Jeffrey [name] is a 78 y.o. male who presents to the Emergency Department complaining of gradual onset and worsening of persistent generalized weakness and fatigue for the past 2 days.  Has been assoc with "my whole body is tingling," as well as "I'm sweating a lot."  Pt describes his symptoms as he "feels weak and worn out."  Pt states his symptoms began after he was discharged from [facility] 2 days ago for "some heart problems."  Denies having cardiac cath performed.  Denies any new meds/change in meds.  Denies fevers, no cough/SOB, no CP/palpitations, no abd pain, no N/V/D, no focal motor weakness, no tingling/numbness in extremities.     PCP is [place].  Cards MD: Corinda Gubler   Past Medical History  Diagnosis Date  . Hypertension   . Diabetes mellitus   . PUD (peptic ulcer disease)   . Coronary artery disease     s/p CABG, s/p stenting to distal LCx, s/p stent to proximal SVG-LCx in 05/2010,  NSTEMI in 04/2011 with cath showing occluded OM2 graft    . Hypercholesterolemia   . Myocardial infarct, old   . GERD (gastroesophageal reflux disease)   . Arthritis   . Stroke     Past Surgical History  Procedure Date  . Coronary artery bypass graft   . Tonsillectomy   . Coronary angioplasty with stent placement       History  Substance Use Topics  . Smoking status: Former Games developer  . Smokeless tobacco: Current User    Types: Chew  . Alcohol Use: No    Review of Systems ROS: Statement: All systems negative except as marked or noted in the HPI; Constitutional: Negative for fever and chills. ; ; Eyes: Negative for eye pain, redness and discharge. ; ;  ENMT: Negative for ear pain, hoarseness, nasal congestion, sinus pressure and sore throat. ; ; Cardiovascular: +diaphoresis.  Negative for chest pain, palpitations, dyspnea and peripheral edema. ; ; Respiratory: Negative for cough, wheezing and stridor. ; ; Gastrointestinal: Negative for nausea, vomiting, diarrhea, abdominal pain, blood in stool, hematemesis, jaundice and rectal bleeding. . ; ; Genitourinary: Negative for dysuria, flank pain and hematuria. ; ; Musculoskeletal: Negative for back pain and neck pain. Negative for swelling and trauma.; ; Skin: Negative for pruritus, rash, abrasions, blisters, bruising and skin lesion.; ; Neuro: +weakness, paresthesias.  Negative for headache, lightheadedness and neck stiffness. Negative for altered level of consciousness , altered mental status, extremity weakness, involuntary movement, seizure and syncope.     Allergies  Review of patient's allergies indicates no known allergies.  Home Medications   Current Outpatient Rx  Name Route Sig Dispense Refill  . ISOSORBIDE MONONITRATE ER 30 MG PO TB24 Oral Take 1 tablet (30 mg total) by mouth daily. 30 tablet 6    Dose increase    BP 131/86  Pulse 84  Temp(Src) 97.9 F (36.6 C) (Oral)  Resp 18  Ht 5\' 10"  (1.778 m)  Wt 180 lb (81.647 kg)  BMI 25.83 kg/m2  SpO2 95%  Physical Exam 0835: Physical examination:  Nursing notes reviewed;  Vital signs and O2 SAT reviewed;  Constitutional: Well developed, Well nourished, Well hydrated, In no acute distress; Head:  Normocephalic, atraumatic; Eyes: EOMI, PERRL, No scleral icterus; ENMT: Mouth and pharynx normal, Mucous membranes moist; Neck: Supple, Full range of motion, No lymphadenopathy; Cardiovascular: Regular rate and rhythm, No murmur or gallop; Respiratory: Breath sounds clear & equal bilaterally, No rales, rhonchi, wheezes, Normal respiratory effort/excursion; Chest: Nontender, Movement normal; Abdomen: Soft, Nontender, Nondistended, Normal bowel sounds;  Extremities: Pulses normal, No tenderness, No edema, No calf edema or asymmetry.; Neuro: AA&Ox3, poor historian.  Major CN grossly intact. Speech clear, no facial droop. No gross focal motor or sensory deficits in extremities.; Skin: Color normal, Warm, Dry, no rash.    ED Course  Procedures     MDM  MDM Reviewed: previous chart, nursing note and vitals Reviewed previous: ECG Interpretation: ECG, labs and x-ray Total time providing critical care: 30-74 minutes. This excludes time spent performing separately reportable procedures and services. Consults: cardiology and admitting MD   CRITICAL CARE Performed by: Laray Anger Total critical care time: 31 Critical care time was exclusive of separately billable procedures and treating other patients. Critical care was necessary to treat or prevent imminent or life-threatening deterioration. Critical care was time spent personally by me on the following activities: development of treatment plan with patient and/or surrogate as well as nursing, discussions with consultants, evaluation of patient's response to treatment, examination of patient, obtaining history from patient or surrogate, ordering and performing treatments and interventions, ordering and review of laboratory studies, ordering and review of radiographic studies, pulse oximetry and re-evaluation of patient's condition.    Date: 10/24/2011  Rate: 78  Rhythm: normal sinus rhythm  QRS Axis: normal  Intervals: normal  ST/T Wave abnormalities: ST depressions anteriorly and ST depressions laterally  Conduction Disutrbances:none  Narrative Interpretation:   Old EKG Reviewed: changes noted; new ST depressions compared to previous EKG's dated 10/21/2011 and 10/20/2011.  Results for orders placed during the hospital encounter of 10/24/11  CBC      Component Value Range   WBC 6.8  4.0 - 10.5 (K/uL)   RBC 4.10 (*) 4.22 - 5.81 (MIL/uL)   Hemoglobin 12.4 (*) 13.0 - 17.0 (g/dL)    HCT 16.1 (*) 09.6 - 52.0 (%)   MCV 90.0  78.0 - 100.0 (fL)   MCH 30.2  26.0 - 34.0 (pg)   MCHC 33.6  30.0 - 36.0 (g/dL)   RDW 04.5  40.9 - 81.1 (%)   Platelets 166  150 - 400 (K/uL)  DIFFERENTIAL      Component Value Range   Neutrophils Relative 71  43 - 77 (%)   Neutro Abs 4.8  1.7 - 7.7 (K/uL)   Lymphocytes Relative 16  12 - 46 (%)   Lymphs Abs 1.1  0.7 - 4.0 (K/uL)   Monocytes Relative 9  3 - 12 (%)   Monocytes Absolute 0.6  0.1 - 1.0 (K/uL)   Eosinophils Relative 4  0 - 5 (%)   Eosinophils Absolute 0.3  0.0 - 0.7 (K/uL)   Basophils Relative 0  0 - 1 (%)   Basophils Absolute 0.0  0.0 - 0.1 (K/uL)  BASIC METABOLIC PANEL      Component Value Range   Sodium 134 (*) 135 - 145 (mEq/L)   Potassium 4.3  3.5 - 5.1 (mEq/L)   Chloride 100  96 - 112 (mEq/L)   CO2 23  19 - 32 (mEq/L)   Glucose, Bld 220 (*) 70 -  99 (mg/dL)   BUN 15  6 - 23 (mg/dL)   Creatinine, Ser 7.82  0.50 - 1.35 (mg/dL)   Calcium 9.4  8.4 - 95.6 (mg/dL)   GFR calc non Af Amer 82 (*) >90 (mL/min)   GFR calc Af Amer >90  >90 (mL/min)  TROPONIN I      Component Value Range   Troponin I 1.00 (*) <0.30 (ng/mL)  URINALYSIS, ROUTINE W REFLEX MICROSCOPIC      Component Value Range   Color, Urine YELLOW  YELLOW    APPearance CLEAR  CLEAR    Specific Gravity, Urine 1.025  1.005 - 1.030    pH 5.5  5.0 - 8.0    Glucose, UA 250 (*) NEGATIVE (mg/dL)   Hgb urine dipstick NEGATIVE  NEGATIVE    Bilirubin Urine NEGATIVE  NEGATIVE    Ketones, ur TRACE (*) NEGATIVE (mg/dL)   Protein, ur NEGATIVE  NEGATIVE (mg/dL)   Urobilinogen, UA 0.2  0.0 - 1.0 (mg/dL)   Nitrite NEGATIVE  NEGATIVE    Leukocytes, UA NEGATIVE  NEGATIVE     0840:  Pt's records under MR# [id number] from [facility] admission 2d ago. Charts to be merged per registration.  Per EPIC chart review, pt was admitted to [facility] from [facility] ED for elevated troponin on 10/20/11 and d/c on 10/22/11 with negative serial troponins and elevated serial CK/CKMB/RI, with pt's D/C summary by  [place] [facility] PA and MD noting pt was ruled out for ACS based on negative serial troponins.  No stress testing or cardiac cath completed during that inpt stay; but plan was to highly consider it if troponins elevated.    4696:  Pt given ASA on arrival to ED.  EKG without acute ST elevations, but does have new ST depressions compared to previous EKG's from several days ago.  Lab troponin elevated today.  Will start 2nd IV site and IV heparin.  Pt continues to deny CP/SOB.  VS remain stable.     1000:  T/C to [place] [facility] Dr. Eden Emms, case discussed, including:  HPI, pertinent PM/SHx, VS/PE, dx testing, ED course and treatment:  Refuses to admit/accept transfer to [facility], states pt is "low risk" and can be admitted to Hospitalist service at [facility] because his cycled cardiac enzymes during his recent admit at [facility] 2-3 days ago "were negative"; I reiterated again that [facility] is a small community hospital that does not have Cardiology coverage for the next several days, pt's high risk PMHx (including last cardiac cath report), pt has an elevated lab troponin (not I-stat troponin which was previously elevated) and new ST depressions on EKG today compared to EKG 3 days ago; he again states pt is "low risk" and with the complaint of "only weakness" and not chest pain, pt can be admitted to [facility] Hospitalist service; he states if they do not want to keep him here at [facility], Hospitalist can admit at [facility].   1055:  T/C to [facility] Dr. Karilyn Cota, case discussed, including:  HPI, pertinent PM/SHx, VS/PE, dx testing, ED course, treatment and D/W Cards MD:  States pt is not appropriate for [facility] and needs transfer to [facility]   1110:  T/C to [facility] Dr. Isidoro Donning, case discussed, including:  HPI, pertinent PM/SHx, VS/PE, dx testing, ED course, treatment, and D/W Cards MD and [facility] MD:  Agreeable to admit/accept pt in transfer, requests to obtain tele bed to team 5 and to have staff call [phone] on pt's arrival to floor.  I  personally performed the services described in this documentation, which was scribed in my presence. The recorded information has been reviewed and considered. Mayda Shippee Allison Quarry, DO 10/26/11 1956

## 2011-10-24 NOTE — Progress Notes (Signed)
ANTICOAGULATION CONSULT NOTE - Initial Consult  Pharmacy Consult for Heparin  Indication: chest pain/ACS  No Known Allergies  Patient Measurements: Height: 5\' 10"  (177.8 cm) Weight: 180 lb (81.647 kg) IBW/kg (Calculated) : 73    Vital Signs: Temp: 97.9 F (36.6 C) (04/12 0820) Temp src: Oral (04/12 0820) BP: 131/86 mmHg (04/12 0820) Pulse Rate: 84  (04/12 0820)  Labs:  Basename 10/24/11 0900  HGB 12.4*  HCT 36.9*  PLT 166  APTT --  LABPROT --  INR --  HEPARINUNFRC --  CREATININE 0.81  CKTOTAL --  CKMB --  TROPONINI 1.00*   Estimated Creatinine Clearance: 75.1 ml/min (by C-G formula based on Cr of 0.81).  Medical History: Past Medical History  Diagnosis Date  . Hypertension   . Diabetes mellitus   . PUD (peptic ulcer disease)   . Coronary artery disease     s/p CABG, s/p stenting to distal LCx, s/p stent to proximal SVG-LCx in 05/2010,  NSTEMI in 04/2011 with cath showing occluded OM2 graft    . Hypercholesterolemia   . Myocardial infarct, old   . GERD (gastroesophageal reflux disease)   . Arthritis   . Stroke     Medications:  Scheduled:    . aspirin  324 mg Oral Once  . heparin  4,000 Units Intravenous Once    Assessment: Ok for protocol  Goal of Therapy:  Heparin level 0.3-0.7 units/ml   Plan:  Heparin 4000 Unit bolus, then Heparin 1000 Units/hr (12 Units/kg/hr) Heparin level in 6 hours, then daily Labs per protocol  Raquel Shad, Kamil Mchaffie Bennett 10/24/2011,10:19 AM

## 2011-10-24 NOTE — Consult Note (Signed)
Reason for Consult:Elevated troponin Referring Physician: Dr Lance Muss Jeffrey [name] is an 78 y.o. male with a past medical history significant for CAD s/p remote CABG and more recent PCI, CVA, HTN, DM and hyperlipidemia. HPI: Patient had just been discharged from the hospital for fatigue. He was admitted on Monday and discharged on Wednesday afternoon. Upon getting back home, he experienced various stressors which include difficulty gaining access into his mobile home, difficulty getting around to obtain food, reduced food and water intake etc. These culminated into worsening of his fatigue and last evening, he experienced a sudden episode of what he described as "gas" over his epigastric area. This was accompanied by diaphoresis and near syncope. He denies syncope, chest pain, jaw pain, orthopnea, PND or leg edema. No palpitations either. On arrival in the hospital, his EKG revealed st-t changes in the lateral leads and elevated and rising troponin (1-2). He is currently asymptomatic. As earlier stated, he had 3 vessel bypass in 1989 with PCI in 2011 and diagnostic cath in 2012 but it appears his coronary anatomy was not amenable to PCI the second time around (pls see past medical history below). Of note, he has two charts that are in the process of being merged so at this time I am unable to obtain his cath report.    Past Medical History  Diagnosis Date  . Hypertension   . Diabetes mellitus   . PUD (peptic ulcer disease)   . Hypercholesterolemia   . GERD (gastroesophageal reflux disease)   . Arthritis   . Stroke   . Coronary artery disease     a.  s/p CABG x 3 1989, VG->LCX, VG->RCA, LIMA->LAD;  b. 05/24/10 Cath/PCI: 3vd, LIMA patent, VG->RCA occluded, VG->LCX 90 in graft (3.0x15 promus) & 90 in distal LCX (2.75x15 Promus);  c. NSTEMI in 04/2011 - Cath occlusion of distal LCX stent.     Past Surgical History  Procedure Date  . Coronary artery bypass graft   . Tonsillectomy   . Coronary  angioplasty with stent placement     History reviewed. No pertinent family history.  Social History:  reports that he has quit smoking. His smokeless tobacco use includes Chew. He reports that he does not drink alcohol or use illicit drugs.  Allergies: No Known Allergies  Medications: I have reviewed the patient's current medications.  Results for orders placed during the hospital encounter of 10/24/11 (from the past 48 hour(s))  CBC     Status: Abnormal   Collection Time   10/24/11  9:00 AM      Component Value Range Comment   WBC 6.8  4.0 - 10.5 (K/uL)    RBC 4.10 (*) 4.22 - 5.81 (MIL/uL)    Hemoglobin 12.4 (*) 13.0 - 17.0 (g/dL)    HCT 16.1 (*) 09.6 - 52.0 (%)    MCV 90.0  78.0 - 100.0 (fL)    MCH 30.2  26.0 - 34.0 (pg)    MCHC 33.6  30.0 - 36.0 (g/dL)    RDW 04.5  40.9 - 81.1 (%)    Platelets 166  150 - 400 (K/uL)   DIFFERENTIAL     Status: Normal   Collection Time   10/24/11  9:00 AM      Component Value Range Comment   Neutrophils Relative 71  43 - 77 (%)    Neutro Abs 4.8  1.7 - 7.7 (K/uL)    Lymphocytes Relative 16  12 - 46 (%)    Lymphs Abs 1.1  0.7 - 4.0 (K/uL)    Monocytes Relative 9  3 - 12 (%)    Monocytes Absolute 0.6  0.1 - 1.0 (K/uL)    Eosinophils Relative 4  0 - 5 (%)    Eosinophils Absolute 0.3  0.0 - 0.7 (K/uL)    Basophils Relative 0  0 - 1 (%)    Basophils Absolute 0.0  0.0 - 0.1 (K/uL)   BASIC METABOLIC PANEL     Status: Abnormal   Collection Time   10/24/11  9:00 AM      Component Value Range Comment   Sodium 134 (*) 135 - 145 (mEq/L)    Potassium 4.3  3.5 - 5.1 (mEq/L)    Chloride 100  96 - 112 (mEq/L)    CO2 23  19 - 32 (mEq/L)    Glucose, Bld 220 (*) 70 - 99 (mg/dL)    BUN 15  6 - 23 (mg/dL)    Creatinine, Ser 9.14  0.50 - 1.35 (mg/dL)    Calcium 9.4  8.4 - 10.5 (mg/dL)    GFR calc non Af Amer 82 (*) >90 (mL/min)    GFR calc Af Amer >90  >90 (mL/min)   TROPONIN I     Status: Abnormal   Collection Time   10/24/11  9:00 AM      Component  Value Range Comment   Troponin I 1.00 (*) <0.30 (ng/mL)   URINALYSIS, ROUTINE W REFLEX MICROSCOPIC     Status: Abnormal   Collection Time   10/24/11  9:00 AM      Component Value Range Comment   Color, Urine YELLOW  YELLOW     APPearance CLEAR  CLEAR     Specific Gravity, Urine 1.025  1.005 - 1.030     pH 5.5  5.0 - 8.0     Glucose, UA 250 (*) NEGATIVE (mg/dL)    Hgb urine dipstick NEGATIVE  NEGATIVE     Bilirubin Urine NEGATIVE  NEGATIVE     Ketones, ur TRACE (*) NEGATIVE (mg/dL)    Protein, ur NEGATIVE  NEGATIVE (mg/dL)    Urobilinogen, UA 0.2  0.0 - 1.0 (mg/dL)    Nitrite NEGATIVE  NEGATIVE     Leukocytes, UA NEGATIVE  NEGATIVE  MICROSCOPIC NOT DONE ON URINES WITH NEGATIVE PROTEIN, BLOOD, LEUKOCYTES, NITRITE, OR GLUCOSE <1000 mg/dL.  PROTIME-INR     Status: Normal   Collection Time   10/24/11 10:40 AM      Component Value Range Comment   Prothrombin Time 13.0  11.6 - 15.2 (seconds)    INR 0.96  0.00 - 1.49    CARDIAC PANEL(CRET KIN+CKTOT+MB+TROPI)     Status: Abnormal   Collection Time   10/24/11  4:32 PM      Component Value Range Comment   Total CK 458 (*) 7 - 232 (U/L)    CK, MB 17.8 (*) 0.3 - 4.0 (ng/mL)    Troponin I 2.68 (*) <0.30 (ng/mL)    Relative Index 3.9 (*) 0.0 - 2.5    HEPARIN LEVEL (UNFRACTIONATED)     Status: Normal   Collection Time   10/24/11  4:33 PM      Component Value Range Comment   Heparin Unfractionated 0.39  0.30 - 0.70 (IU/mL)   GLUCOSE, CAPILLARY     Status: Abnormal   Collection Time   10/24/11  4:43 PM      Component Value Range Comment   Glucose-Capillary 126 (*) 70 - 99 (mg/dL)    Comment  1 Documented in Chart      Comment 2 Notify RN     GLUCOSE, CAPILLARY     Status: Abnormal   Collection Time   10/24/11  9:04 PM      Component Value Range Comment   Glucose-Capillary 228 (*) 70 - 99 (mg/dL)    Comment 1 Documented in Chart      Comment 2 Notify RN       Dg Chest 2 View  10/24/2011  *RADIOLOGY REPORT*  Clinical Data: Cough, CHF.   CHEST - 2 VIEW  Comparison: None  Findings: There is hyperinflation of the lungs compatible with COPD.  Prior CABG. Heart and mediastinal contours are within normal limits.  No focal opacities or effusions.  No acute bony abnormality.  IMPRESSION: COPD.  No active disease.  Original Report Authenticated By: Cyndie Chime, M.D.    Review of Systems  Constitutional: Positive for diaphoresis.  HENT: Negative.   Eyes: Negative.   Respiratory: Negative.   Cardiovascular: Negative for chest pain, palpitations, orthopnea, claudication, leg swelling and PND.  Gastrointestinal: Positive for heartburn and nausea. Negative for vomiting, abdominal pain, diarrhea and constipation.  Genitourinary: Negative.   Musculoskeletal: Negative.   Skin: Negative.   Neurological: Positive for dizziness and weakness.  Endo/Heme/Allergies: Negative.   Psychiatric/Behavioral: Negative.    Blood pressure 130/77, pulse 75, temperature 98.2 F (36.8 C), temperature source Oral, resp. rate 18, height 5\' 10"  (1.778 m), weight 178 lb 12.7 oz (81.1 kg), SpO2 95.00%. Physical Exam  Constitutional: He is oriented to person, place, and time. He appears well-developed and well-nourished. No distress.  HENT:  Head: Normocephalic.  Mouth/Throat: No oropharyngeal exudate.  Eyes: Conjunctivae are normal. Pupils are equal, round, and reactive to light. No scleral icterus.  Neck: Normal range of motion. No JVD present. No thyromegaly present.  Cardiovascular: Normal rate, regular rhythm, S1 normal and S2 normal.  PMI is not displaced.  Exam reveals no gallop, no S3, no S4, no distant heart sounds and no friction rub.   Murmur heard.  Systolic murmur is present with a grade of 1/6  Pulses:      Radial pulses are 2+ on the right side, and 2+ on the left side.       Femoral pulses are 2+ on the right side, and 2+ on the left side.      Popliteal pulses are 2+ on the right side, and 2+ on the left side.       Dorsalis pedis pulses  are 1+ on the right side, and 1+ on the left side.       Posterior tibial pulses are 1+ on the right side, and 1+ on the left side.  Respiratory: Effort normal and breath sounds normal. No stridor.  GI: Soft. Bowel sounds are normal. There is no splenomegaly or hepatomegaly.    Musculoskeletal: Normal range of motion. He exhibits no edema.       Arms:      Right leg has scar from harvested vein graft. 1cm by 1cm scabbed wound over the right upper leg.  Lymphadenopathy:    He has no cervical adenopathy.  Neurological: He is alert and oriented to person, place, and time.  Skin: Skin is warm and dry. No rash noted. He is not diaphoretic. No erythema.  Psychiatric: He has a normal mood and affect. His behavior is normal. Judgment and thought content normal.   EKG  Sinus 75b/m, 1mm downsloping ST depression in V5-6,isolated 0.21mm St  elevation in III, isolated PVC, QTc ,   Assessment/Plan: NSTEMI in patient with known CAD s/p remote CABG and more recent PCI  RECOMMENDATIONS Primary team has already started patient on all the guideline recommended drugs which include ASA, plavix,Heparin, BB, ACEI and statins. I agree with all the above. It appears that patient's anatomy was not amenable to further PCI when he has his last cardiac catheterization in October 2012. If this is confirmed to be the case, it appears he will be medically managed this time around especially given that he is stable and pain free. We will consider switching is plavix to a more potent antiplatelet such as Effient (prasugrel) to reduce recurrence. In the meantime, pls continue with serial EKGs and cardiac enzymes. Cardiology will follow with primary team. Thank you for the consult.   Grandville Silos 10/24/2011, 10:19 PM

## 2011-10-24 NOTE — Progress Notes (Signed)
Utilization Review Completed.Jeffrey [name] T4/06/2012   

## 2011-10-25 DIAGNOSIS — I2510 Atherosclerotic heart disease of native coronary artery without angina pectoris: Secondary | ICD-10-CM | POA: Diagnosis present

## 2011-10-25 DIAGNOSIS — I214 Non-ST elevation (NSTEMI) myocardial infarction: Secondary | ICD-10-CM

## 2011-10-25 LAB — COMPREHENSIVE METABOLIC PANEL
ALT: 21 U/L (ref 0–53)
AST: 24 U/L (ref 0–37)
CO2: 24 mEq/L (ref 19–32)
Calcium: 9.2 mg/dL (ref 8.4–10.5)
Chloride: 102 mEq/L (ref 96–112)
Creatinine, Ser: 0.78 mg/dL (ref 0.50–1.35)
GFR calc Af Amer: >90 mL/min (ref >90)
GFR calc non Af Amer: 83 mL/min — ABNORMAL LOW (ref >90)
Glucose, Bld: 184 mg/dL — ABNORMAL HIGH (ref 70–99)
Total Bilirubin: 0.3 mg/dL (ref 0.3–1.2)

## 2011-10-25 LAB — CBC
HCT: 39.1 % (ref 39.0–52.0)
MCH: 30.1 pg (ref 26.0–34.0)
MCV: 89.9 fL (ref 78.0–100.0)
Platelets: 153 10*3/uL (ref 150–400)
RDW: 12.7 % (ref 11.5–15.5)

## 2011-10-25 LAB — CARDIAC PANEL(CRET KIN+CKTOT+MB+TROPI)
CK, MB: 14.6 ng/mL (ref 0.3–4.0)
Relative Index: 3.3 — ABNORMAL HIGH (ref 0.0–2.5)
Troponin I: 1.93 ng/mL (ref <0.30)

## 2011-10-25 LAB — PHOSPHORUS: Phosphorus: 2.7 mg/dL (ref 2.3–4.6)

## 2011-10-25 LAB — GLUCOSE, CAPILLARY: Glucose-Capillary: 182 mg/dL — ABNORMAL HIGH (ref 70–99)

## 2011-10-25 LAB — TSH: TSH: 2.037 u[IU]/mL (ref 0.350–4.500)

## 2011-10-25 LAB — APTT: aPTT: 55 seconds — ABNORMAL HIGH (ref 24–37)

## 2011-10-25 LAB — PROTIME-INR: INR: 1.01 (ref 0.00–1.49)

## 2011-10-25 MED ORDER — INSULIN GLARGINE 100 UNIT/ML ~~LOC~~ SOLN
4.0000 [IU] | Freq: Every day | SUBCUTANEOUS | Status: DC
  Administered 2011-10-25: 4 [IU] via SUBCUTANEOUS

## 2011-10-25 NOTE — Progress Notes (Signed)
CARDIAC REHAB PHASE I   PRE:  Rate/Rhythm: 83 SR  BP:  Supine: 110/70  Sitting:   Standing:    SaO2: 96 RA  MODE:  Ambulation: 350 ft   POST:  Rate/Rhythem: 96  BP:  Supine:   Sitting: 118/66  Standing:    SaO2: 98 RA  Jeffrey [name] [employer] received and appreciated. Chart reviewed. Pt ambulated 350 ft assist x 2 with rolling walker. Tolerated well, complained of mild lightheadedness upon standing but this resolved during ambulation. No other symptoms reported. Gait stable. Returned to [employer] VSS. Instructed pt not to walk around without RN assistance. RN notified that pt is now sitting in recliner. Pt given MI education booklet. Will follow up. Electronically signed by Harriett Sine MS on Saturday October 25 2011 at 1358

## 2011-10-25 NOTE — Progress Notes (Signed)
Appreciate cardiology assistance. Jeffrey [name] feels good this morning. He denies chest pain.   1. Non-STEMI (non-ST elevated myocardial infarction)     Past Medical History  Diagnosis Date  . Hypertension   . Diabetes mellitus   . PUD (peptic ulcer disease)   . Hypercholesterolemia   . GERD (gastroesophageal reflux disease)   . Arthritis   . Stroke   . Coronary artery disease     a.  s/p CABG x 3 1989, VG->LCX, VG->RCA, LIMA->LAD;  b. 05/24/10 Cath/PCI: 3vd, LIMA patent, VG->RCA occluded, VG->LCX 90 in graft (3.0x15 promus) & 90 in distal LCX (2.75x15 Promus);  c. NSTEMI in 04/2011 - Cath occlusion of distal LCX stent.    Current Facility-Administered Medications  Medication Dose Route Frequency Provider Last Rate Last Dose  . 0.9 %  sodium chloride infusion   Intravenous Continuous Jeffrey Flagler, MD 50 mL/hr at 10/24/11 1723    . acetaminophen (TYLENOL) tablet 650 mg  650 mg Oral Q6H PRN Jeffrey Poage, MD       Or  . acetaminophen (TYLENOL) suppository 650 mg  650 mg Rectal Q6H PRN Jeffrey Footman, MD      . aspirin chewable tablet 324 mg  324 mg Oral Once Jeffrey Anger, DO   162 mg at 10/24/11 0851  . aspirin EC tablet 325 mg  325 mg Oral Daily Jeffrey Wilmarth, MD      . carvedilol (COREG) tablet 6.25 mg  6.25 mg Oral BID WC Jeffrey Hammett, MD   6.25 mg at 10/25/11 4540  . clopidogrel (PLAVIX) tablet 75 mg  75 mg Oral Daily Jeffrey Chandran, MD      . heparin ADULT infusion 100 units/mL (25000 units/250 mL)  1,000 Units/hr Intravenous Continuous Jeffrey [name], PHARMD      . heparin bolus via infusion 4,000 Units  4,000 Units Intravenous Once Jeffrey Anger, DO   4,000 Units at 10/24/11 1025  . insulin aspart (novoLOG) injection 0-9 Units  0-9 Units Subcutaneous TID WC Jeffrey Canal, MD   2 Units at 10/25/11 0636  . isosorbide mononitrate (IMDUR) 24 hr tablet 30 mg  30 mg Oral Daily Jeffrey Tarver, MD      . linagliptin (TRADJENTA) tablet 5 mg  5 mg Oral Daily Jeffrey Mounsey, MD       . morphine 2 MG/ML injection 2 mg  2 mg Intravenous Q3H PRN Jeffrey Cohick, MD      . nitrofurantoin (MACRODANTIN) capsule 100 mg  100 mg Oral QHS Jeffrey Tercero, MD   100 mg at 10/24/11 2140  . nitroGLYCERIN (NITROSTAT) SL tablet 0.4 mg  0.4 mg Sublingual Q5 Min x 3 PRN Jeffrey Serio, MD      . pantoprazole (PROTONIX) EC tablet 40 mg  40 mg Oral Q1200 Jeffrey Saperstein, MD      . ramipril (ALTACE) capsule 5 mg  5 mg Oral Daily Jeffrey Balthazar, MD      . simvastatin (ZOCOR) tablet 20 mg  20 mg Oral q1800 Jeffrey Lovern, MD   20 mg at 10/24/11 1819  . sodium chloride 0.9 % injection 3 mL  3 mL Intravenous Q12H Jeffrey Dawe, MD   3 mL at 10/24/11 2140  . Tamsulosin HCl (FLOMAX) capsule 0.4 mg  0.4 mg Oral QHS Jeffrey Parrow, MD   0.4 mg at 10/24/11 2140  . DISCONTD: 0.9 %  sodium chloride infusion   Intravenous Continuous Jeffrey Anger, DO 75 mL/hr at 10/24/11 0845    . DISCONTD: carvedilol (  COREG) tablet 6.25 mg  6.25 mg Oral BID WC Jeffrey Dietze, MD      . DISCONTD: heparin ADULT infusion 100 units/mL (25000 units/250 mL)  12 Units/kg/hr Intravenous Continuous Jeffrey Anger, DO 10 mL/hr at 10/24/11 1027 12 Units/kg/hr at 10/24/11 1027  . DISCONTD: linagliptin (TRADJENTA) tablet 5 mg  5 mg Oral Daily Jeffrey Lambertson, MD      . DISCONTD: pantoprazole (PROTONIX) EC tablet 40 mg  40 mg Oral Daily Jeffrey Osbourn, MD      . DISCONTD: ramipril (ALTACE) capsule 5 mg  5 mg Oral Daily Jeffrey Esquilin, MD       No Known Allergies Principal Problem:  *NSTEMI (non-ST elevated myocardial infarction) Active Problems:  DM (diabetes mellitus), type 2, uncontrolled  HTN (hypertension)  Hyperlipidemia  Dementia   Vital signs in last 24 hours: Temp:  [97.9 F (36.6 C)-98.9 F (37.2 C)] 98.9 F (37.2 C) (04/13 0349) Pulse Rate:  [72-84] 74  (04/13 0349) Resp:  [16-19] 19  (04/13 0349) BP: (111-139)/(55-86) 124/71 mmHg (04/13 0349) SpO2:  [95 %-100 %] 95 % (04/13 0349) Weight:  [81.1 kg (178 lb 12.7  oz)-81.647 kg (180 lb)] 81.1 kg (178 lb 12.7 oz) (04/12 1453) Weight change:  Last BM Date: 10/23/11  Intake/Output from previous day: 04/12 0701 - 04/13 0700 In: 480 [P.O.:480] Out: 1451 [Urine:1451] Intake/Output this shift:    Lab Results:  Basename 10/25/11 0610 10/24/11 0900  WBC 7.5 6.8  HGB 13.1 12.4*  HCT 39.1 36.9*  PLT 153 166   BMET  Basename 10/25/11 0610 10/24/11 0900  NA 137 134*  K 4.0 4.3  CL 102 100  CO2 24 23  GLUCOSE 184* 220*  BUN 10 15  CREATININE 0.78 0.81  CALCIUM 9.2 9.4    Studies/Results: Dg Chest 2 View  10/24/2011  *RADIOLOGY REPORT*  Clinical Data: Cough, CHF.  CHEST - 2 VIEW  Comparison: None  Findings: There is hyperinflation of the lungs compatible with COPD.  Prior CABG. Heart and mediastinal contours are within normal limits.  No focal opacities or effusions.  No acute bony abnormality.  IMPRESSION: COPD.  No active disease.  Original Report Authenticated By: Jeffrey [name], M.D.    Medications: I have reviewed the patient's current medications.   Physical exam GENERAL- alert HEAD- normal atraumatic, no neck masses, normal thyroid, no jvd RESPIRATORY- appears well, vitals normal, no respiratory distress, acyanotic, normal RR, ear and throat exam is normal, neck free of mass or lymphadenopathy, chest clear, no wheezing, crepitations, rhonchi, normal symmetric air entry CVS- regular rate and rhythm, S1, S2 normal, no murmur, click, rub or gallop ABDOMEN- abdomen is soft without significant tenderness, masses, organomegaly or guarding NEURO- Forgetful. Says he does not recall if the cardiologist came by last night. EXTREMITIES- extremities normal, atraumatic, no cyanosis or edema  Plan  .NSTEMI (non-ST elevated myocardial infarction)- appreciate cardiology. Continue current mx( to complete 48-72 hours of heparin per acs protocol. Pharmacy appreciated.) .DM (diabetes mellitus), type 2, uncontrolled- hb a1c 8.1. Hold metformin. Ssi.Add  low dose lantus. Marland KitchenHTN (hypertension)- Controlled on current meds. .Hyperlipidemia- on zocor. .Dementia- ? Baseline. Monitor.  Condition guarded.     Jeffrey [name] 10/25/2011 8:18 AM Pager: [phone].

## 2011-10-25 NOTE — Progress Notes (Signed)
Subjective:   Jeffrey [name] is an 78 y.o. male with a past medical history significant for CAD s/p remote CABG, CVA, HTN, DM and hyperlipidemia. Admitted with fatigue and CP. R/I for NSTEMI, with troponin.  Feeling better. No further CP or indigestion. Cardiac markers trending down  Last cath 10/12 - showed diffuse disease with stable revasc and no areas amenable to PCI.   1. Left ventriculography. EF was estimated to be 60%. There was  severe mid to apical inferior hypokinesis.  2. Right coronary artery. The right coronary artery was a dominant  vessel. There is subtotal occlusion in the proximal right  coronary. There were right-to-right collaterals was reconstitution  of the mid right coronary. Then, the right coronary was totally  occluded in the midportion. The PDA reconstituted by left-to-right  collaterals from the distal LAD. There was a saphenous vein graft  to the RCA that was totally occluded. This has been known from  prior catheterizations.  3. Left main. The left main had luminal irregularities.  4. Left circumflex system. The left circumflex is totally occluded in  the midportion. There is a sequential saphenous vein graft to the  first and second obtuse marginals. This saphenous vein graft had a  proximal stent that had about 30-40% in-stent restenosis. The  graft itself artery, however, was patent to the first obtuse  marginal, which was a moderate-sized vessel. The limb of the  sequential saphenous vein graft that continued on to the second  obtuse marginal was totally occluded. There was a stent in that  segment that was totally occluded. The second obtuse marginal was  a small vessel, and it was refilled by collaterals from the first  obtuse marginal.  5. LAD system. There was an 80% proximal LAD stenosis. The mid LAD  was occluded. There is a patent LIMA to the mid LAD. There was  about 40% stenosis in the LAD beyond the LIMA touchdown.    Intake/Output  Summary (Last 24 hours) at 10/25/11 1039 Last data filed at 10/25/11 0900  Gross per 24 hour  Intake    720 ml  Output   1751 ml  Net  -1031 ml    Current meds:    . aspirin EC  325 mg Oral Daily  . carvedilol  6.25 mg Oral BID WC  . clopidogrel  75 mg Oral Daily  . insulin aspart  0-9 Units Subcutaneous TID WC  . insulin glargine  4 Units Subcutaneous Daily  . isosorbide mononitrate  30 mg Oral Daily  . linagliptin  5 mg Oral Daily  . nitrofurantoin  100 mg Oral QHS  . pantoprazole  40 mg Oral Q1200  . ramipril  5 mg Oral Daily  . simvastatin  20 mg Oral q1800  . sodium chloride  3 mL Intravenous Q12H  . Tamsulosin HCl  0.4 mg Oral QHS  . DISCONTD: carvedilol  6.25 mg Oral BID WC  . DISCONTD: linagliptin  5 mg Oral Daily  . DISCONTD: pantoprazole  40 mg Oral Daily  . DISCONTD: ramipril  5 mg Oral Daily   Infusions:    . heparin    . DISCONTD: sodium chloride 75 mL/hr at 10/24/11 0845  . DISCONTD: sodium chloride 50 mL/hr at 10/24/11 1723  . DISCONTD: heparin 12 Units/kg/hr (10/24/11 1027)     Objective:  Blood pressure 120/70, pulse 74, temperature 98.9 F (37.2 C), temperature source Oral, resp. rate 19, height 5\' 10"  (1.778 m), weight 81.1 kg (178 lb  12.7 oz), SpO2 95.00%. Weight change:   Physical Exam: General:  Elderly. No resp difficulty HEENT: normal except for edentulous Neck: supple. JVP flat . Carotids 2+ bilat; no bruits. No lymphadenopathy or thryomegaly appreciated. Cor: PMI nondisplaced. Regular rate & rhythm. No rubs, gallops or murmurs. Lungs: clear Abdomen: soft, nontender, nondistended. No hepatosplenomegaly. No bruits or masses. Good bowel sounds. Extremities: no cyanosis, clubbing, rash, edema Neuro: alert & orientedx3, cranial nerves grossly intact. moves all 4 extremities w/o difficulty. Affect pleasant  Telemetry: NSR  Lab Results: Basic Metabolic Panel:  Lab 10/25/11 1324 10/24/11 0900  NA 137 134*  K 4.0 4.3  CL 102 100  CO2 24  23  GLUCOSE 184* 220*  BUN 10 15  CREATININE 0.78 0.81  CALCIUM 9.2 9.4  MG 1.9 --  PHOS 2.7 --   Liver Function Tests:  Lab 10/25/11 0610  AST 24  ALT 21  ALKPHOS 62  BILITOT 0.3  PROT 6.3  ALBUMIN 3.5   No results found for this basename: LIPASE:5,AMYLASE:5 in the last 168 hours No results found for this basename: AMMONIA:5 in the last 168 hours CBC:  Lab 10/25/11 0610 10/24/11 0900  WBC 7.5 6.8  NEUTROABS -- 4.8  HGB 13.1 12.4*  HCT 39.1 36.9*  MCV 89.9 90.0  PLT 153 166   Cardiac Enzymes:  Lab 10/25/11 0917 10/24/11 2359 10/24/11 1632 10/24/11 0900  CKTOTAL 315* 424* 458* --  CKMB 10.3* 14.6* 17.8* --  CKMBINDEX -- -- -- --  TROPONINI 1.93* 2.76* 2.68* 1.00*   BNP: No components found with this basename: POCBNP:5 CBG:  Lab 10/25/11 0601 10/24/11 2104 10/24/11 1643  GLUCAP 182* 228* 126*   Microbiology: No results found for this basename: cult   No results found for this basename: CULT:2,SDES:2 in the last 168 hours  Imaging: Dg Chest 2 View  10/24/2011  *RADIOLOGY REPORT*  Clinical Data: Cough, CHF.  CHEST - 2 VIEW  Comparison: None  Findings: There is hyperinflation of the lungs compatible with COPD.  Prior CABG. Heart and mediastinal contours are within normal limits.  No focal opacities or effusions.  No acute bony abnormality.  IMPRESSION: COPD.  No active disease.  Original Report Authenticated By: Cyndie Chime, M.D.     ASSESSMENT:  1. NSTEMI 2. CAD s/p CABG     --last cath 10/12 3. DM2 4. H/o CVA  PLAN/DISCUSSION:  I have reviewed his cath films. He has multiple areas for potential ischemia with poor targets for revascularization. At this point I would favor non-invasive approach unless he has recurrent CP. Will stop heparin. Start Imdur. Continue asa, b-blocker, plavix (no effent due to previous CVA), statin. Add Imdur. Will chech P2y12 level and if platelets not inhibit could consider Brillinta. Will need cardiac rehab.  Ambulate.  Reuel Boom Hason Ofarrell,MD 10:58 AM      LOS: 1 day    Arvilla Meres, MD 10/25/2011, 10:39 AM

## 2011-10-26 LAB — COMPREHENSIVE METABOLIC PANEL
Alkaline Phosphatase: 63 U/L (ref 39–117)
BUN: 10 mg/dL (ref 6–23)
Chloride: 101 mEq/L (ref 96–112)
Creatinine, Ser: 0.88 mg/dL (ref 0.50–1.35)
GFR calc Af Amer: >90 mL/min (ref >90)
GFR calc non Af Amer: 79 mL/min — ABNORMAL LOW (ref >90)
Glucose, Bld: 225 mg/dL — ABNORMAL HIGH (ref 70–99)
Potassium: 3.8 mEq/L (ref 3.5–5.1)
Total Bilirubin: 0.2 mg/dL — ABNORMAL LOW (ref 0.3–1.2)

## 2011-10-26 LAB — PROTIME-INR
INR: 0.97 (ref 0.00–1.49)
Prothrombin Time: 13.1 seconds (ref 11.6–15.2)

## 2011-10-26 LAB — GLUCOSE, CAPILLARY
Glucose-Capillary: 188 mg/dL — ABNORMAL HIGH (ref 70–99)
Glucose-Capillary: 217 mg/dL — ABNORMAL HIGH (ref 70–99)

## 2011-10-26 LAB — URINE CULTURE
Colony Count: NO GROWTH
Culture  Setup Time: 201304130122
Culture: NO GROWTH

## 2011-10-26 MED ORDER — INSULIN GLARGINE 100 UNIT/ML ~~LOC~~ SOLN
7.0000 [IU] | Freq: Every day | SUBCUTANEOUS | Status: DC
  Administered 2011-10-26 – 2011-10-27 (×2): 7 [IU] via SUBCUTANEOUS

## 2011-10-26 MED ORDER — ASPIRIN EC 81 MG PO TBEC
81.0000 mg | DELAYED_RELEASE_TABLET | Freq: Every day | ORAL | Status: DC
  Administered 2011-10-27: 81 mg via ORAL
  Filled 2011-10-26 (×2): qty 1

## 2011-10-26 MED ORDER — TICAGRELOR 90 MG PO TABS
90.0000 mg | ORAL_TABLET | Freq: Two times a day (BID) | ORAL | Status: DC
  Administered 2011-10-26 – 2011-10-27 (×3): 90 mg via ORAL
  Filled 2011-10-26 (×6): qty 1

## 2011-10-26 NOTE — Progress Notes (Signed)
Subjective:   Jeffrey [name] is an 78 y.o. male with a past medical history significant for CAD s/p remote CABG, CVA, HTN, DM and hyperlipidemia. Admitted with fatigue and CP. R/I for NSTEMI, with troponin ~2.  Last cath 10/12 - showed diffuse disease with stable revasc and no areas amenable to PCI.   1. Left ventriculography. EF was estimated to be 60%. There was  severe mid to apical inferior hypokinesis.  2. Right coronary artery. The right coronary artery was a dominant  vessel. There is subtotal occlusion in the proximal right  coronary. There were right-to-right collaterals was reconstitution  of the mid right coronary. Then, the right coronary was totally  occluded in the midportion. The PDA reconstituted by left-to-right  collaterals from the distal LAD. There was a saphenous vein graft  to the RCA that was totally occluded. This has been known from  prior catheterizations.  3. Left main. The left main had luminal irregularities.  4. Left circumflex system. The left circumflex is totally occluded in  the midportion. There is a sequential saphenous vein graft to the  first and second obtuse marginals. This saphenous vein graft had a  proximal stent that had about 30-40% in-stent restenosis. The  graft itself artery, however, was patent to the first obtuse  marginal, which was a moderate-sized vessel. The limb of the  sequential saphenous vein graft that continued on to the second  obtuse marginal was totally occluded. There was a stent in that  segment that was totally occluded. The second obtuse marginal was  a small vessel, and it was refilled by collaterals from the first  obtuse marginal.  5. LAD system. There was an 80% proximal LAD stenosis. The mid LAD  was occluded. There is a patent LIMA to the mid LAD. There was  about 40% stenosis in the LAD beyond the LIMA touchdown.  Denies further CP. Says he felt weak with ambulation.    Intake/Output Summary (Last 24  hours) at 10/26/11 1017 Last data filed at 10/26/11 0834  Gross per 24 hour  Intake    720 ml  Output   1550 ml  Net   -830 ml    Current meds:    . aspirin EC  325 mg Oral Daily  . carvedilol  6.25 mg Oral BID WC  . clopidogrel  75 mg Oral Daily  . insulin aspart  0-9 Units Subcutaneous TID WC  . insulin glargine  7 Units Subcutaneous Daily  . isosorbide mononitrate  30 mg Oral Daily  . linagliptin  5 mg Oral Daily  . nitrofurantoin  100 mg Oral QHS  . pantoprazole  40 mg Oral Q1200  . ramipril  5 mg Oral Daily  . simvastatin  20 mg Oral q1800  . sodium chloride  3 mL Intravenous Q12H  . Tamsulosin HCl  0.4 mg Oral QHS  . DISCONTD: insulin glargine  4 Units Subcutaneous Daily   Infusions:    . DISCONTD: heparin       Objective:  Blood pressure 122/72, pulse 71, temperature 98.9 F (37.2 C), temperature source Oral, resp. rate 19, height 5\' 10"  (1.778 m), weight 81.1 kg (178 lb 12.7 oz), SpO2 95.00%. Weight change:   Physical Exam: General:  Elderly. No resp difficulty HEENT: normal except for edentulous and mild L facial droop Neck: supple. JVP flat . Carotids 2+ bilat; no bruits. No lymphadenopathy or thryomegaly appreciated. Cor: PMI nondisplaced. Regular rate & rhythm. No rubs, gallops or murmurs. Lungs:  clear Abdomen: soft, nontender, nondistended. No hepatosplenomegaly. No bruits or masses. Good bowel sounds. Extremities: no cyanosis, clubbing, rash, edema Neuro: alert & orientedx3, cranial nerves grossly intact. moves all 4 extremities w/o difficulty. Affect pleasant  Telemetry: NSR  Lab Results: Basic Metabolic Panel:  Lab 10/26/11 4098 10/25/11 0610 10/24/11 0900  NA 135 137 134*  K 3.8 4.0 --  CL 101 102 100  CO2 23 24 23   GLUCOSE 225* 184* 220*  BUN 10 10 15   CREATININE 0.88 0.78 0.81  CALCIUM 9.1 9.2 9.4  MG -- 1.9 --  PHOS -- 2.7 --   Liver Function Tests:  Lab 10/26/11 0500 10/25/11 0610  AST 23 24  ALT 18 21  ALKPHOS 63 62  BILITOT  0.2* 0.3  PROT 6.3 6.3  ALBUMIN 3.3* 3.5   No results found for this basename: LIPASE:5,AMYLASE:5 in the last 168 hours No results found for this basename: AMMONIA:5 in the last 168 hours CBC:  Lab 10/25/11 0610 10/24/11 0900  WBC 7.5 6.8  NEUTROABS -- 4.8  HGB 13.1 12.4*  HCT 39.1 36.9*  MCV 89.9 90.0  PLT 153 166   Cardiac Enzymes:  Lab 10/25/11 0917 10/24/11 2359 10/24/11 1632 10/24/11 0900  CKTOTAL 315* 424* 458* --  CKMB 10.3* 14.6* 17.8* --  CKMBINDEX -- -- -- --  TROPONINI 1.93* 2.76* 2.68* 1.00*   BNP: No components found with this basename: POCBNP:5 CBG:  Lab 10/26/11 0619 10/25/11 2117 10/25/11 1620 10/25/11 1103 10/25/11 0601  GLUCAP 201* 179* 186* 180* 182*   P2Y12 = 269   Microbiology: Lab Results  Component Value Date   CULT NO GROWTH 10/24/2011    Lab 10/24/11 0900  CULT NO GROWTH  SDES URINE, CLEAN CATCH    Imaging: No results found.   ASSESSMENT:  1. NSTEMI 2. CAD s/p CABG     --last cath 10/12     --s/p previous PCI 3. DM2 4. H/o CVA  PLAN/DISCUSSION:  I have reviewed his cath films. He has multiple areas for potential ischemia with poor targets for revascularization. At this point I would favor non-invasive approach unless he has recurrent CP. Continue asa, b-blocker, imdur statin. Add Imdur. P2Y12 is high so will switch to Brilinta 90 bid if he can afford. Will need cardiac rehab. Ambulate. ? If he needs SNF. Primary team consulted PT/OT. Should be ready for d/c in am from cardiac perspective.    LOS: 2 days    Arvilla Meres, MD 10/26/2011, 10:17 AM

## 2011-10-26 NOTE — Progress Notes (Signed)
PHARMACIST - PHYSICIAN COMMUNICATION  CONCERNING:  Brilinta and Aspririn  Patient recently changed to Brilinta 90mg  bid from plavix. Full dose aspirin was ordered, this is contraindicated due to worse outcomes in the Brilinta trials. Per P&[employer] policy I have decreased the aspirin to 81mg  daily(required dosing 100mg ).   Thank You,  Please call with any questions Sheppard Coil PharmD, BCPS [phone]

## 2011-10-26 NOTE — Progress Notes (Signed)
SUBJECTIVE Feels ok. No chest pain, but says he easily got tired with ambulation yesterday.   1. Non-STEMI (non-ST elevated myocardial infarction)   2. CAD (coronary artery disease)     Past Medical History  Diagnosis Date  . Hypertension   . Diabetes mellitus   . PUD (peptic ulcer disease)   . Hypercholesterolemia   . GERD (gastroesophageal reflux disease)   . Arthritis   . Stroke   . Coronary artery disease     a.  s/p CABG x 3 1989, VG->LCX, VG->RCA, LIMA->LAD;  b. 05/24/10 Cath/PCI: 3vd, LIMA patent, VG->RCA occluded, VG->LCX 90 in graft (3.0x15 promus) & 90 in distal LCX (2.75x15 Promus);  c. NSTEMI in 04/2011 - Cath occlusion of distal LCX stent.    Current Facility-Administered Medications  Medication Dose Route Frequency Provider Last Rate Last Dose  . acetaminophen (TYLENOL) tablet 650 mg  650 mg Oral Q6H PRN Marily Konczal, MD       Or  . acetaminophen (TYLENOL) suppository 650 mg  650 mg Rectal Q6H PRN Ludwika Rodd, MD      . aspirin EC tablet 325 mg  325 mg Oral Daily Tuck Dulworth, MD   325 mg at 10/26/11 0843  . carvedilol (COREG) tablet 6.25 mg  6.25 mg Oral BID WC Jhania Etherington, MD   6.25 mg at 10/26/11 0622  . clopidogrel (PLAVIX) tablet 75 mg  75 mg Oral Daily Corynn Solberg, MD   75 mg at 10/25/11 1030  . insulin aspart (novoLOG) injection 0-9 Units  0-9 Units Subcutaneous TID WC Conley Canal, MD   3 Units at 10/26/11 0627  . insulin glargine (LANTUS) injection 4 Units  4 Units Subcutaneous Daily Trinetta Alemu, MD   4 Units at 10/25/11 1031  . isosorbide mononitrate (IMDUR) 24 hr tablet 30 mg  30 mg Oral Daily Makari Sanko, MD   30 mg at 10/25/11 1030  . linagliptin (TRADJENTA) tablet 5 mg  5 mg Oral Daily Diondre Pulis, MD   5 mg at 10/25/11 1030  . morphine 2 MG/ML injection 2 mg  2 mg Intravenous Q3H PRN Abby Tucholski, MD      . nitrofurantoin (MACRODANTIN) capsule 100 mg  100 mg Oral QHS Kaeleigh Westendorf, MD   100 mg at 10/25/11 2135  . nitroGLYCERIN (NITROSTAT)  SL tablet 0.4 mg  0.4 mg Sublingual Q5 Min x 3 PRN Vi Whitesel, MD      . pantoprazole (PROTONIX) EC tablet 40 mg  40 mg Oral Q1200 Desani Sprung, MD   40 mg at 10/25/11 1156  . ramipril (ALTACE) capsule 5 mg  5 mg Oral Daily Blanton Kardell, MD   5 mg at 10/25/11 1030  . simvastatin (ZOCOR) tablet 20 mg  20 mg Oral q1800 Gianlucca Szymborski, MD   20 mg at 10/25/11 1717  . sodium chloride 0.9 % injection 3 mL  3 mL Intravenous Q12H Deedee Lybarger, MD   3 mL at 10/25/11 2136  . Tamsulosin HCl (FLOMAX) capsule 0.4 mg  0.4 mg Oral QHS Vardaan Depascale, MD   0.4 mg at 10/25/11 2134  . DISCONTD: heparin ADULT infusion 100 units/mL (25000 units/250 mL)  1,000 Units/hr Intravenous Continuous Severiano Gilbert, PHARMD       No Known Allergies Principal Problem:  *NSTEMI (non-ST elevated myocardial infarction) Active Problems:  DM (diabetes mellitus), type 2, uncontrolled  HTN (hypertension)  Hyperlipidemia  Dementia  CAD (coronary artery disease)   Vital signs in last 24 hours: Temp:  [98.6 F (  37 C)-98.9 F (37.2 C)] 98.9 F (37.2 C) (04/14 0424) Pulse Rate:  [71-75] 71  (04/14 0621) Resp:  [18-19] 19  (04/14 0424) BP: (100-125)/(57-77) 125/77 mmHg (04/14 0621) SpO2:  [95 %-97 %] 95 % (04/14 0424) Weight change:  Last BM Date: 10/23/11  Intake/Output from previous day: 04/13 0701 - 04/14 0700 In: 720 [P.O.:720] Out: 1850 [Urine:1850] Intake/Output this shift: Total I/O In: 240 [P.O.:240] Out: -   Lab Results:  Basename 10/25/11 0610 10/24/11 0900  WBC 7.5 6.8  HGB 13.1 12.4*  HCT 39.1 36.9*  PLT 153 166   BMET  Basename 10/26/11 0500 10/25/11 0610  NA 135 137  K 3.8 4.0  CL 101 102  CO2 23 24  GLUCOSE 225* 184*  BUN 10 10  CREATININE 0.88 0.78  CALCIUM 9.1 9.2    Studies/Results: Dg Chest 2 View  10/24/2011  *RADIOLOGY REPORT*  Clinical Data: Cough, CHF.  CHEST - 2 VIEW  Comparison: None  Findings: There is hyperinflation of the lungs compatible with COPD.  Prior CABG.  Heart and mediastinal contours are within normal limits.  No focal opacities or effusions.  No acute bony abnormality.  IMPRESSION: COPD.  No active disease.  Original Report Authenticated By: Cyndie Chime, M.D.    Medications: I have reviewed the patient's current medications.   Physical exam GENERAL- alert HEAD- normal atraumatic, no neck masses, normal thyroid, no jvd RESPIRATORY- appears well, vitals normal, no respiratory distress, acyanotic, normal RR, ear and throat exam is normal, neck free of mass or lymphadenopathy, chest clear, no wheezing, crepitations, rhonchi, normal symmetric air entry CVS- regular rate and rhythm, S1, S2 normal, no murmur, click, rub or gallop ABDOMEN- abdomen is soft without significant tenderness, masses, organomegaly or guarding NEURO- Grossly normal EXTREMITIES- extremities normal, atraumatic, no cyanosis or edema  Plan  .NSTEMI (non-ST elevated myocardial infarction)- appreciate cardiology assistance. Doing well. Continue mx with cardiology direction.  .DM (diabetes mellitus), type 2, uncontrolled-increase lantus dose lantus. Marland KitchenHTN (hypertension)- Controlled on current meds. .Hyperlipidemia- on zocor. .Dementia- ? Baseline. Monitor.  Condition guarded.        Jeffrey [name] 10/26/2011 9:37 AM Pager: [phone].

## 2011-10-26 NOTE — Progress Notes (Signed)
Patient ambulated with RN using rolling walker approximately 42ft on room air.  Patient tolerated ambulation fair.  Patient stated that he was a little tired.  Vital signs stable. Patient returned to bed resting.  Patient does not have a walker at home when asked and stated " I think I may be okay at home".  Patient may need an assistive device. Will continue to monitor.

## 2011-10-27 LAB — CBC
Hemoglobin: 12.9 g/dL — ABNORMAL LOW (ref 13.0–17.0)
MCH: 30.2 pg (ref 26.0–34.0)
MCHC: 33.8 g/dL (ref 30.0–36.0)
MCV: 89.5 fL (ref 78.0–100.0)
RBC: 4.27 MIL/uL (ref 4.22–5.81)

## 2011-10-27 LAB — GLUCOSE, CAPILLARY: Glucose-Capillary: 185 mg/dL — ABNORMAL HIGH (ref 70–99)

## 2011-10-27 MED ORDER — SIMVASTATIN 20 MG PO TABS: 20.0000 mg | ORAL_TABLET | Freq: Every day | ORAL | Status: DC

## 2011-10-27 MED ORDER — NITROGLYCERIN 0.4 MG SL SUBL: 0.4000 mg | SUBLINGUAL_TABLET | SUBLINGUAL | Status: DC | PRN

## 2011-10-27 MED ORDER — TRAMADOL HCL 50 MG PO TABS: 50.0000 mg | ORAL_TABLET | Freq: Three times a day (TID) | ORAL | Status: AC | PRN

## 2011-10-27 MED ORDER — TICAGRELOR 90 MG PO TABS: 90.0000 mg | ORAL_TABLET | Freq: Two times a day (BID) | ORAL | Status: DC

## 2011-10-27 NOTE — Evaluation (Signed)
Occupational Therapy Evaluation Patient Details Name: Jeffrey [name] MRN: [id number] DOB: 08-23-1928 Today's Date: 10/27/2011  Problem List:  Patient Active Problem List  Diagnoses  . NSTEMI (non-ST elevated myocardial infarction)  . DM (diabetes mellitus), type 2, uncontrolled  . HTN (hypertension)  . Hyperlipidemia  . Dementia  . CAD (coronary artery disease)    Past Medical History:  Past Medical History  Diagnosis Date  . Hypertension   . Diabetes mellitus   . PUD (peptic ulcer disease)   . Hypercholesterolemia   . GERD (gastroesophageal reflux disease)   . Arthritis   . Stroke   . Coronary artery disease     a.  s/p CABG x 3 1989, VG->LCX, VG->RCA, LIMA->LAD;  b. 05/24/10 Cath/PCI: 3vd, LIMA patent, VG->RCA occluded, VG->LCX 90 in graft (3.0x15 promus) & 90 in distal LCX (2.75x15 Promus);  c. NSTEMI in 04/2011 - Cath occlusion of distal LCX stent.    Past Surgical History:  Past Surgical History  Procedure Date  . Coronary artery bypass graft   . Tonsillectomy   . Coronary angioplasty with stent placement     OT Assessment/Plan/Recommendation OT Assessment Clinical Impression Statement: Pt admitted with NSTEMI and presents with balance deficits and lack of insight and/or willingness to accept responsibility for these which places him at high risk for falls and other injuries. Pt is resistive to the idea of SNF, OPOT or ALF. Pt also states he has no family or friends who could provide additional Supervision at home. Pt did state his dtr and son-in-law may be able to provide some meals. Recommend SNF for d/c plan. However, because pt resistive of this, rec. HHOT and intermittent supervision for d/c home. OT Recommendation/Assessment: All further OT needs can be met in the next venue of care OT Problem List: Decreased strength;Decreased activity tolerance;Impaired balance (sitting and/or standing);Decreased safety awareness;Decreased knowledge of use of DME or AE;Decreased  knowledge of precautions OT Recommendation Follow Up Recommendations: Home health OT;Supervision - Intermittent (preferably SNF- however pt resistive to this idea) Equipment Recommended: 3 in 1 bedside comode;Tub/shower seat Individuals Consulted Consulted and Agree with Results and Recommendations: Patient OT Goals    OT Evaluation Precautions/Restrictions  Precautions Precautions: Fall Restrictions Weight Bearing Restrictions: No Prior Functioning Home Living Lives With: Alone Type of Home: Mobile home Home Access: Stairs to enter Entrance Stairs-Number of Steps: 3 Entrance Stairs-Rails: Right Home Layout: One level Bathroom Shower/Tub: Engineer, manufacturing systems: Standard Home Adaptive Equipment: None Prior Function Level of Independence: Independent Able to Take Stairs?: Yes Driving: Yes Vocation: Retired Comments: Pt states he doesn't cook other than microwave meals and goes to [employer] a lot for hamburgers. Also states he goes to church a couple of times a week and pays $1 for lunch which he uses as lunch/dinner.  ADL ADL Eating/Feeding: Simulated;Independent Where Assessed - Eating/Feeding: Edge of bed Grooming: Performed;Supervision/safety Where Assessed - Grooming: Standing at sink Upper Body Bathing: Simulated;Supervision/safety Where Assessed - Upper Body Bathing:  (standing by bed) Lower Body Bathing: Simulated;Supervision/safety Where Assessed - Lower Body Bathing: Sit to stand from bed Upper Body Dressing: Performed;Supervision/safety Upper Body Dressing Details (indicate cue type and reason): educated pt to perform in sitting Where Assessed - Upper Body Dressing: Standing Lower Body Dressing: Performed;Supervision/safety Where Assessed - Lower Body Dressing: Sit to stand from bed Toilet Transfer: Performed;Supervision/safety Toilet Transfer Equipment: Regular height toilet;Grab bars Toileting - Clothing Manipulation:  Performed;Supervision/safety Toileting - Clothing Manipulation Details (indicate cue type and reason): pt leaned up against wall to  maintain balance. Asked if he can do this at home and pt stated he could.  Where Assessed - Toileting Clothing Manipulation: Standing Toileting - Hygiene: Performed;Supervision/safety Toileting - Hygiene Details (indicate cue type and reason): pt performed leaning against wall Where Assessed - Toileting Hygiene: Standing Tub/Shower Transfer: Not assessed Equipment Used: Rolling walker Ambulation Related to ADLs: Supervision with RW ambulation. Pt states he has "never used one of these before" VC to remain within the RW and to continue to use it while in bathroom and through turns. Pt is quick to leave RW and attempt to walk without it in tight places.  ADL Comments: Pt slightly impulsive with ambulation and movements in general. Easily fatigued. Expressed my concern with this especially when it comes to showering, as pt does not own shower seat. Pt refuses ordering of tub/shower seat and insists he "can manage." Educated pt on energy conservation techniques, but also encouraged activity as pt is weak from being in bed.  Cognition Cognition Arousal/Alertness: Awake/alert Overall Cognitive Status: Impaired Orientation Level: Oriented X4 Safety/Judgement: Decreased safety judgement for tasks assessed Decreased Safety/Judgement: Other (comment) Safety/Judgement - Other Comments: Pt lacks insight into balance deficits and how these impact function. Also, resistant to education for change in eating habits and safety with ADLs/ADL mobility. Sensation/Coordination Sensation Light Touch: Appears Intact Coordination Gross Motor Movements are Fluid and Coordinated: Yes Fine Motor Movements are Fluid and Coordinated: Yes Extremity Assessment RUE Assessment RUE Assessment: Within Functional Limits LUE Assessment LUE Assessment: Within Functional Limits Mobility  Bed  Mobility Bed Mobility: Yes Supine to Sit: 6: Modified independent (Device/Increase time);HOB flat;With rails Sitting - Scoot to Edge of Bed: 6: Modified independent (Device/Increase time) Transfers Sit to Stand: 5: Supervision;From bed Sit to Stand Details (indicate cue type and reason): Supervision with sit to stand from bed. pt with posterior lean initially, pressing calves into bed to maintain balance.  Stand to Sit: 7: Independent;To bed;6: Modified independent (Device/Increase time);To toilet;With armrests End of Session OT - End of Session Activity Tolerance: Patient limited by fatigue Patient left: in bed;with call bell in reach General Behavior During Session:  (impulsive) Cognition: WFL for tasks performed   Maranda Marte 10/27/2011, 2:39 PM

## 2011-10-27 NOTE — Progress Notes (Signed)
Reviewed NTG/calling 911 with pt. Reminded to decrease fat/salt. Pt has poor understanding.  [id number] Ethelda Chick CES, ACSM

## 2011-10-27 NOTE — Discharge Instructions (Signed)
Acute Coronary Syndrome    Acute coronary syndrome (ACS) is an urgent problem in which the blood and oxygen supply to the heart is critically deficient. ACS requires hospitalization because one or more coronary arteries may be blocked.  ACS represents a range of conditions including:  · Previous angina that is now unstable, lasts longer, happens at rest, or is more intense.  · A heart attack, with heart muscle cell injury and death.  There are three vital coronary arteries that supply the heart muscle with blood and oxygen so that it can pump blood effectively. If blockages to these arteries develop, blood flow to the heart muscle is reduced. If the heart does not get enough blood, angina may occur as the first warning sign.  SYMPTOMS   · The most common signs of angina include:  · Tightness or squeezing in the chest.  · Feeling of heaviness on the chest.  · Discomfort in the arms, neck, or jaw.  · Shortness of breath and nausea.  · Cold, wet skin.  · Angina is usually brought on by physical effort or excitement which increase the oxygen needs of the heart. These states increase the blood flow needs of the heart beyond what can be delivered.  TREATMENT   · Medicines to help discomfort may include nitroglycerin (nitro) in the form of tablets or a spray for rapid relief, or longer-acting forms such as cream, patches, or capsules. (Be aware that there are many side effects and possible interactions with other drugs).  · Other medicines may be used to help the heart pump better.  · Procedures to open blocked arteries including angioplasty or stent placement to keep the arteries open.  · Open heart surgery may be needed when there are many blockages or they are in critical locations that are best treated with surgery.  HOME CARE INSTRUCTIONS   · Avoid smoking.  · Take one baby or adult aspirin daily, if your caregiver advises. This helps reduce the risk of a heart attack.  · It is very important that you follow the  angina treatment prescribed by your caregiver. Make arrangements for proper follow-up care.  · Eat a heart healthy diet with salt and fat restrictions as advised.  · Regular exercise is good for you as long as it does not cause discomfort. Do not begin any new type of exercise until you check with your caregiver.  · If you are overweight, you should lose weight.  · Try to maintain normal blood lipid levels.  · Keep your blood pressure under control as recommended by your caregiver.  · You should tell your caregiver right away about any increase in the severity or frequency of your chest discomfort or angina attacks. When you have angina, you should stop what you are doing and sit down. This may bring relief in 3 to 5 minutes. If your caregiver has prescribed nitro, take it as directed.  · If your caregiver has given you a follow-up appointment, it is very important to keep that appointment. Not keeping the appointment could result in a chronic or permanent injury, pain, and disability. If there is any problem keeping the appointment, you must call back to this facility for assistance.  SEEK IMMEDIATE MEDICAL CARE IF:   · You develop nausea, vomiting, or shortness of breath.  · You feel faint, lightheaded, or pass out.  · Your chest discomfort gets worse.  · You are sweating or experience sudden profound fatigue.  · You do   not get relief of your chest pain after 3 doses of nitro.  · Your discomfort lasts longer than 15 minutes.  MAKE SURE YOU:   · Understand these instructions.  · Will watch your condition.  · Will get help right away if you are not doing well or get worse.  Document Released: 06/30/2005 Document Revised: 06/19/2011 Document Reviewed: 02/01/2008  [employer]® Patient Information ©2012 [employer].

## 2011-10-27 NOTE — Evaluation (Signed)
Physical Therapy Evaluation Patient Details Name: Jeffrey [name] MRN: [id number] DOB: 29-Jun-1929 Today's Date: 10/27/2011  Problem List:  Patient Active Problem List  Diagnoses  . NSTEMI (non-ST elevated myocardial infarction)  . DM (diabetes mellitus), type 2, uncontrolled  . HTN (hypertension)  . Hyperlipidemia  . Dementia  . CAD (coronary artery disease)    Past Medical History:  Past Medical History  Diagnosis Date  . Hypertension   . Diabetes mellitus   . PUD (peptic ulcer disease)   . Hypercholesterolemia   . GERD (gastroesophageal reflux disease)   . Arthritis   . Stroke   . Coronary artery disease     a.  s/p CABG x 3 1989, VG->LCX, VG->RCA, LIMA->LAD;  b. 05/24/10 Cath/PCI: 3vd, LIMA patent, VG->RCA occluded, VG->LCX 90 in graft (3.0x15 promus) & 90 in distal LCX (2.75x15 Promus);  c. NSTEMI in 04/2011 - Cath occlusion of distal LCX stent.    Past Surgical History:  Past Surgical History  Procedure Date  . Coronary artery bypass graft   . Tonsillectomy   . Coronary angioplasty with stent placement     PT Assessment/Plan/Recommendation PT Assessment Clinical Impression Statement: Pt admitted with NSTEMI and presents with significant balance deficits and poor insight into how is daily habits and diet affect his health. Pt scored 30 on Berg placing him at a high fall risk and pt states there is no additional help at home. Pt very resistant to the idea of additional help at home and appeared to think ALF was absurd. Pt drives and leaves the home therefore HHPT requirements would not be met and pt quite frankly stated that he would not go to OPPT. Pt would benefit from therapy for his balance and whatever additional assist is available as far as his diet is concerned. Should pt decide not to drive and be homebound then certainly HHPT would be helpful.  Will follow acutely to maximize balance and safety awareness to increase pt independence.  PT Recommendation/Assessment:  Patient will need skilled PT in the acute care venue PT Problem List: Decreased knowledge of use of DME;Decreased balance;Decreased activity tolerance;Decreased safety awareness Barriers to Discharge: Decreased caregiver support Barriers to Discharge Comments: pt states no caregiver available PT Therapy Diagnosis : Abnormality of gait PT Plan PT Frequency: Min 3X/week PT Treatment/Interventions: Gait training;DME instruction;Functional mobility training;Therapeutic activities;Therapeutic exercise;Balance training;Patient/family education PT Recommendation Recommendations for Other Services: OT consult Follow Up Recommendations: Home health PT Equipment Recommended: Rolling walker with 5" wheels PT Goals  Acute Rehab PT Goals PT Goal Formulation: With patient Time For Goal Achievement: 7 days Pt will go Sit to Stand: with modified independence PT Goal: Sit to Stand - Progress: Goal set today Pt will go Stand to Sit: with modified independence PT Goal: Stand to Sit - Progress: Goal set today Pt will Ambulate: >150 feet;with least restrictive assistive device;with modified independence PT Goal: Ambulate - Progress: Goal set today Pt will Perform Home Exercise Program: Independently PT Goal: Perform Home Exercise Program - Progress: Goal set today Additional Goals Additional Goal #1: Pt will score greater than 42 on Berg to decrease fall risk. PT Goal: Additional Goal #1 - Progress: Goal set today  PT Evaluation Precautions/Restrictions  Precautions Precautions: Fall Prior Functioning  Home Living Lives With: Alone Type of Home: Mobile home Home Access: Stairs to enter Entrance Stairs-Number of Steps: 3 Entrance Stairs-Rails: Right Home Layout: One level Bathroom Shower/Tub: Engineer, manufacturing systems: Standard Home Adaptive Equipment: None Prior Function Level of Independence: Independent  Able to Take Stairs?: Yes Driving: Yes Vocation: Retired Comments: Pt states he  doesn't cook other than microwave meals and goes to [employer] a lot for hamburgers Cognition Cognition Arousal/Alertness: Awake/alert Overall Cognitive Status: Impaired Orientation Level: Oriented X4 Safety/Judgement: Decreased safety judgement for tasks assessed Decreased Safety/Judgement: Other (comment) Safety/Judgement - Other Comments: Pt resistant to education for change in eating habits or education of balance deficits. Pt lacks insight into poor habits affecting health with report of no plan to change current habits Sensation/Coordination Sensation Light Touch: Not tested Extremity Assessment RLE Assessment RLE Assessment: Within Functional Limits LLE Assessment LLE Assessment: Within Functional Limits Mobility (including Balance) Bed Mobility Bed Mobility: Yes Supine to Sit: 6: Modified independent (Device/Increase time);HOB flat;With rails Sitting - Scoot to Edge of Bed: 6: Modified independent (Device/Increase time) Transfers Transfers: Yes Sit to Stand: 6: Modified independent (Device/Increase time);From bed;From chair/3-in-1 Stand to Sit: 6: Modified independent (Device/Increase time);To bed;To chair/3-in-1 Ambulation/Gait Ambulation/Gait: Yes Ambulation/Gait Assistance: 5: Supervision;4: Min assist Ambulation/Gait Assistance Details (indicate cue type and reason): Pt amb 200' with RW with cues to step into RW and after seated rest amb 200' again without RW with LOB with head turns and scissoring x 1 with assist to recover balance Ambulation Distance (Feet): 200 Feet Assistive device: Rolling walker;None Gait Pattern: Decreased stride length Stairs: Yes Stairs Assistance: 5: Supervision Stairs Assistance Details (indicate cue type and reason): supervision for safety Stair Management Technique: One rail Right Number of Stairs: 3  Height of Stairs: 8   Posture/Postural Control Posture/Postural Control: No significant limitations Balance Balance Assessed: Yes Berg  Balance Test Sit to Stand: Able to stand  independently using hands Standing Unsupported: Able to stand 2 minutes with supervision Sitting with Back Unsupported but Feet Supported on Floor or Stool: Able to sit safely and securely 2 minutes Stand to Sit: Controls descent by using hands Transfers: Able to transfer safely, definite need of hands Standing Unsupported with Eyes Closed: Able to stand 10 seconds with supervision Standing Ubsupported with Feet Together: Able to place feet together independently but unable to hold for 30 seconds From Standing, Reach Forward with Outstretched Arm: Can reach forward >12 cm safely (5") From Standing Position, Pick up Object from Floor: Able to pick up shoe, needs supervision From Standing Position, Turn to Look Behind Over each Shoulder: Looks behind one side only/other side shows less weight shift Turn 360 Degrees: Needs assistance while turning Standing Unsupported, Alternately Place Feet on Step/Stool: Needs assistance to keep from falling or unable to try Standing Unsupported, One Foot in Front: Loses balance while stepping or standing Standing on One Leg: Unable to try or needs assist to prevent fall Total Score: 30  Exercise    End of Session PT - End of Session Equipment Utilized During Treatment: Gait belt Activity Tolerance: Patient tolerated treatment well Patient left: in chair;with call bell in reach Nurse Communication: Mobility status for transfers;Mobility status for ambulation General Behavior During Session: [facility] for tasks performed Cognition: [facility] for tasks performed Patient Class: Inpatient [facility] 10/27/2011, 11:42 AM  Delaney Meigs, PT [phone]

## 2011-10-27 NOTE — Progress Notes (Signed)
Pt d/c home with instructions, r/x, and f/u appointments. Pt verbalized understanding of instructions, home with daughter, escorted out by [place] NT.

## 2011-10-27 NOTE — Progress Notes (Signed)
Utilization Review Completed.Jeffrey [name] T4/15/2013   

## 2011-10-27 NOTE — Discharge Summary (Signed)
DISCHARGE SUMMARY  Jeffrey [name]  MR#: [id number]  DOB:10/02/1928  Date of Admission: 10/24/2011 Date of Discharge: 10/27/2011  Attending Physician:Nikolaus Pienta  Patient's NWG:NFAOZ,HYQMVHQI J., MD, MD  Consults:Treatment Team:  Luis Abed, MD  Discharge Diagnoses: Present on Admission:  .NSTEMI (non-ST elevated myocardial infarction) .DM (diabetes mellitus), type 2, uncontrolled .HTN (hypertension) .Hyperlipidemia .Dementia .CAD (coronary artery disease)   Hospital Course: Mr Telfair is a pleasant gentleman admitted on 10/24/11 with complaints of generalized weakness preceded by some chest pain a few days prior. He had actually been discharged 2 days prior to this admission after ruling out for MI. He denied chest pain at admission but eventually ruled in for NSTEMI. [place] cardiology graciously saw him in consult and follow up. cardiology felt that cardiac cath would not be helpful as patient is known to have inoperable disease, hence recommended medical management, which included iv heparin/beta blocker/statin/asa/plavix/ntg. Dr Gala Romney later switched him to [facility] and took him off plavix, which Mr Mato had been taking religiously, in addition to ASA. Mr mabe had a 2decho last week, which showed a normal EF, hence 2decho not repeated in this admission. He feels better and will d/c today, to continue cardiac rehab outpatient. I have also requested a home RN to assess his situation as he has some mild dementia and lives alone. He should follow with [facility] cardiology in [place].   Medication List  As of 10/27/2011 11:14 AM   STOP taking these medications         clopidogrel 75 MG tablet         TAKE these medications         aspirin EC 325 MG tablet   Take 325 mg by mouth daily.      carvedilol 6.25 MG tablet   Commonly known as: COREG   Take 6.25 mg by mouth 2 (two) times daily with a meal.      isosorbide mononitrate 30 MG 24 hr tablet   Commonly  known as: IMDUR   Take 1 tablet (30 mg total) by mouth daily.      metFORMIN 500 MG tablet   Commonly known as: GLUCOPHAGE   Take 500 mg by mouth 2 (two) times daily with a meal.      nitrofurantoin 100 MG capsule   Commonly known as: MACRODANTIN   Take 100 mg by mouth at bedtime.      nitroGLYCERIN 0.4 MG SL tablet   Commonly known as: NITROSTAT   Place 1 tablet (0.4 mg total) under the tongue every 5 (five) minutes x 3 doses as needed for chest pain.      ONGLYZA 5 MG Tabs tablet   Generic drug: saxagliptin HCl   Take 5 mg by mouth daily.      pantoprazole 40 MG tablet   Commonly known as: PROTONIX   Take 40 mg by mouth daily.      ramipril 5 MG capsule   Commonly known as: ALTACE   Take 5 mg by mouth daily.      simvastatin 20 MG tablet   Commonly known as: ZOCOR   Take 1 tablet (20 mg total) by mouth daily at 6 PM.      Tamsulosin HCl 0.4 MG Caps   Commonly known as: FLOMAX   Take 0.4 mg by mouth at bedtime.      Ticagrelor 90 MG Tabs tablet   Commonly known as: BRILINTA   Take 1 tablet (90 mg total) by mouth 2 (two) times daily.  traMADol 50 MG tablet   Commonly known as: ULTRAM   Take 1 tablet (50 mg total) by mouth every 8 (eight) hours as needed for pain.             Day of Discharge BP 127/78  Pulse 72  Temp(Src) 98.6 F (37 C) (Oral)  Resp 17  Ht 5\' 10"  (1.778 m)  Wt 81.1 kg (178 lb 12.7 oz)  BMI 25.65 kg/m2  SpO2 94%  Physical Exam: At baseline.  Results for orders placed during the hospital encounter of 10/24/11 (from the past 24 hour(s))  GLUCOSE, CAPILLARY     Status: Abnormal   Collection Time   10/26/11 11:20 AM      Component Value Range   Glucose-Capillary 188 (*) 70 - 99 (mg/dL)  GLUCOSE, CAPILLARY     Status: Abnormal   Collection Time   10/26/11  4:13 PM      Component Value Range   Glucose-Capillary 217 (*) 70 - 99 (mg/dL)  GLUCOSE, CAPILLARY     Status: Abnormal   Collection Time   10/26/11  9:27 PM      Component  Value Range   Glucose-Capillary 188 (*) 70 - 99 (mg/dL)   Comment 1 Documented in Chart     Comment 2 Notify RN    CBC     Status: Abnormal   Collection Time   10/27/11  6:00 AM      Component Value Range   WBC 8.3  4.0 - 10.5 (K/uL)   RBC 4.27  4.22 - 5.81 (MIL/uL)   Hemoglobin 12.9 (*) 13.0 - 17.0 (g/dL)   HCT 11.9 (*) 14.7 - 52.0 (%)   MCV 89.5  78.0 - 100.0 (fL)   MCH 30.2  26.0 - 34.0 (pg)   MCHC 33.8  30.0 - 36.0 (g/dL)   RDW 82.9  56.2 - 13.0 (%)   Platelets 157  150 - 400 (K/uL)  GLUCOSE, CAPILLARY     Status: Abnormal   Collection Time   10/27/11  6:02 AM      Component Value Range   Glucose-Capillary 185 (*) 70 - 99 (mg/dL)    Disposition: home with hhs.   Follow-up Appts: Discharge Orders    Future Orders Please Complete By Expires   Diet - low sodium heart healthy      Increase activity slowly         Follow-up Information    Follow up with [name], MD in 1 week.      Follow up with Joni Reining, NP.   Contact information:   [address], [address] [place] [address] [phone]           Time spent in discharge (includes decision making & examination of pt): 25 minutes  Signed: Jordayn Mink 10/27/2011, 11:14 AM

## 2011-10-27 NOTE — Progress Notes (Signed)
   CARE MANAGEMENT NOTE 10/27/2011  Patient:  Jeffrey [name], Jeffrey [name]   Account Number:  [id number]  Date Initiated:  10/27/2011  Documentation initiated by:  Alvira Philips Assessment:   78 yr-old male adm with dx of of NSTEMI; lives alone, independent PTA.     Anticipated DC Plan:  HOME W HOME HEALTH SERVICES      DC Planning Services  CM consult      PAC Choice  DURABLE MEDICAL EQUIPMENT  HOME HEALTH   Choice offered to / List presented to:  C-1 Patient   DME arranged  3-N-1  Levan Hurst      DME agency  [facility]     [facility] arranged  HH-1 RN  HH-2 PT  HH-4 NURSE'S AIDE      HH agency  [facility]   Status of service:  Completed, signed off  Discharge Disposition:  HOME W HOME HEALTH SERVICES  Comments:  PCP:  Dr. Elfredia Nevins  Contact:  Court Joy, daughter  [phone]  10/27/11 1140 Zebediah Beezley RN MSN CCM Per PT, pt is a fall risk due to very poor balance.  Pt also is making questionable choices regarding his diet and could benefit from some education.  Pt states his daughter and son-in-law live nearby and will assist him with IADLs until he regains his strength and balance.  Provided list of agencies and referral made per choice.

## 2011-10-28 ENCOUNTER — Encounter: Payer: Self-pay | Admitting: Nurse Practitioner

## 2011-11-01 ENCOUNTER — Encounter (HOSPITAL_COMMUNITY): Payer: Self-pay | Admitting: Family Medicine

## 2011-11-01 ENCOUNTER — Encounter (HOSPITAL_COMMUNITY)
Admission: EM | Disposition: A | Discharge status: Skilled Nursing Facility | Payer: Self-pay | Source: Ambulatory Visit | Attending: Internal Medicine

## 2011-11-01 ENCOUNTER — Inpatient Hospital Stay (HOSPITAL_COMMUNITY)
Admission: EM | Admit: 2011-11-01 | Discharge: 2011-11-05 | DRG: 247 | Disposition: A | Discharge status: Skilled Nursing Facility | Payer: Medicare Other | Source: Ambulatory Visit | Attending: Internal Medicine | Admitting: Internal Medicine

## 2011-11-01 ENCOUNTER — Emergency Department (HOSPITAL_COMMUNITY): Payer: Medicare Other

## 2011-11-01 ENCOUNTER — Ambulatory Visit (HOSPITAL_COMMUNITY): Admit: 2011-11-01 | Payer: Self-pay | Admitting: Cardiovascular Disease

## 2011-11-01 DIAGNOSIS — I200 Unstable angina: Secondary | ICD-10-CM

## 2011-11-01 DIAGNOSIS — I2510 Atherosclerotic heart disease of native coronary artery without angina pectoris: Secondary | ICD-10-CM | POA: Diagnosis present

## 2011-11-01 DIAGNOSIS — I252 Old myocardial infarction: Secondary | ICD-10-CM

## 2011-11-01 DIAGNOSIS — K219 Gastro-esophageal reflux disease without esophagitis: Secondary | ICD-10-CM | POA: Diagnosis present

## 2011-11-01 DIAGNOSIS — Z951 Presence of aortocoronary bypass graft: Secondary | ICD-10-CM

## 2011-11-01 DIAGNOSIS — T82897A Other specified complication of cardiac prosthetic devices, implants and grafts, initial encounter: Secondary | ICD-10-CM | POA: Diagnosis present

## 2011-11-01 DIAGNOSIS — I249 Acute ischemic heart disease, unspecified: Secondary | ICD-10-CM

## 2011-11-01 DIAGNOSIS — R531 Weakness: Secondary | ICD-10-CM

## 2011-11-01 DIAGNOSIS — R079 Chest pain, unspecified: Secondary | ICD-10-CM

## 2011-11-01 DIAGNOSIS — R42 Dizziness and giddiness: Secondary | ICD-10-CM

## 2011-11-01 DIAGNOSIS — I10 Essential (primary) hypertension: Secondary | ICD-10-CM | POA: Diagnosis present

## 2011-11-01 DIAGNOSIS — Z9861 Coronary angioplasty status: Secondary | ICD-10-CM

## 2011-11-01 DIAGNOSIS — E1165 Type 2 diabetes mellitus with hyperglycemia: Secondary | ICD-10-CM

## 2011-11-01 DIAGNOSIS — E785 Hyperlipidemia, unspecified: Secondary | ICD-10-CM | POA: Diagnosis present

## 2011-11-01 DIAGNOSIS — R55 Syncope and collapse: Secondary | ICD-10-CM

## 2011-11-01 DIAGNOSIS — Z8673 Personal history of transient ischemic attack (TIA), and cerebral infarction without residual deficits: Secondary | ICD-10-CM

## 2011-11-01 DIAGNOSIS — I25810 Atherosclerosis of coronary artery bypass graft(s) without angina pectoris: Secondary | ICD-10-CM | POA: Diagnosis present

## 2011-11-01 DIAGNOSIS — E7800 Pure hypercholesterolemia, unspecified: Secondary | ICD-10-CM | POA: Diagnosis present

## 2011-11-01 DIAGNOSIS — R0600 Dyspnea, unspecified: Secondary | ICD-10-CM

## 2011-11-01 DIAGNOSIS — M1990 Unspecified osteoarthritis, unspecified site: Secondary | ICD-10-CM | POA: Diagnosis present

## 2011-11-01 DIAGNOSIS — Y849 Medical procedure, unspecified as the cause of abnormal reaction of the patient, or of later complication, without mention of misadventure at the time of the procedure: Secondary | ICD-10-CM | POA: Diagnosis present

## 2011-11-01 DIAGNOSIS — I951 Orthostatic hypotension: Secondary | ICD-10-CM | POA: Diagnosis present

## 2011-11-01 DIAGNOSIS — I214 Non-ST elevation (NSTEMI) myocardial infarction: Principal | ICD-10-CM | POA: Diagnosis present

## 2011-11-01 DIAGNOSIS — E119 Type 2 diabetes mellitus without complications: Secondary | ICD-10-CM | POA: Diagnosis present

## 2011-11-01 DIAGNOSIS — F17200 Nicotine dependence, unspecified, uncomplicated: Secondary | ICD-10-CM | POA: Diagnosis present

## 2011-11-01 HISTORY — PX: PERCUTANEOUS CORONARY STENT INTERVENTION (PCI-S): SHX5485

## 2011-11-01 HISTORY — PX: LEFT HEART CATHETERIZATION WITH CORONARY ANGIOGRAM: SHX5451

## 2011-11-01 LAB — BASIC METABOLIC PANEL
BUN: 13 mg/dL (ref 6–23)
Calcium: 9.3 mg/dL (ref 8.4–10.5)
Chloride: 99 mEq/L (ref 96–112)
Creatinine, Ser: 0.8 mg/dL (ref 0.50–1.35)
GFR calc Af Amer: >90 mL/min (ref >90)

## 2011-11-01 LAB — LIPID PANEL
Cholesterol: 119 mg/dL (ref 0–200)
HDL: 35 mg/dL — ABNORMAL LOW (ref >39)
Total CHOL/HDL Ratio: 3.4 RATIO

## 2011-11-01 LAB — CARDIAC PANEL(CRET KIN+CKTOT+MB+TROPI)
CK, MB: 8.6 ng/mL (ref 0.3–4.0)
CK, MB: 7.9 ng/mL (ref 0.3–4.0)
CK, MB: 7.5 ng/mL (ref 0.3–4.0)
Total CK: 143 U/L (ref 7–232)
Troponin I: 1.05 ng/mL (ref <0.30)

## 2011-11-01 LAB — CBC
HCT: 36.8 % — ABNORMAL LOW (ref 39.0–52.0)
HCT: 35.6 % — ABNORMAL LOW (ref 39.0–52.0)
Hemoglobin: 12.7 g/dL — ABNORMAL LOW (ref 13.0–17.0)
MCV: 90 fL (ref 78.0–100.0)
MCV: 89 fL (ref 78.0–100.0)
RBC: 4.09 MIL/uL — ABNORMAL LOW (ref 4.22–5.81)
RBC: 4 MIL/uL — ABNORMAL LOW (ref 4.22–5.81)
WBC: 8.3 10*3/uL (ref 4.0–10.5)
WBC: 6.7 10*3/uL (ref 4.0–10.5)

## 2011-11-01 LAB — PROTIME-INR
INR: 1.02 (ref 0.00–1.49)
Prothrombin Time: 13.6 seconds (ref 11.6–15.2)

## 2011-11-01 LAB — COMPREHENSIVE METABOLIC PANEL
Albumin: 3.9 g/dL (ref 3.5–5.2)
Alkaline Phosphatase: 80 U/L (ref 39–117)
BUN: 14 mg/dL (ref 6–23)
Creatinine, Ser: 0.91 mg/dL (ref 0.50–1.35)
Potassium: 4.5 mEq/L (ref 3.5–5.1)
Total Protein: 6.9 g/dL (ref 6.0–8.3)

## 2011-11-01 LAB — DIFFERENTIAL
Eosinophils Relative: 3 % (ref 0–5)
Lymphocytes Relative: 11 % — ABNORMAL LOW (ref 12–46)
Lymphs Abs: 0.9 10*3/uL (ref 0.7–4.0)
Monocytes Absolute: 0.7 10*3/uL (ref 0.1–1.0)
Monocytes Relative: 9 % (ref 3–12)

## 2011-11-01 LAB — HEMOGLOBIN A1C
Hgb A1c MFr Bld: 7.6 % — ABNORMAL HIGH (ref <5.7)
Mean Plasma Glucose: 171 mg/dL — ABNORMAL HIGH (ref <117)

## 2011-11-01 LAB — MRSA PCR SCREENING: MRSA by PCR: NEGATIVE

## 2011-11-01 SURGERY — LEFT HEART CATHETERIZATION WITH CORONARY ANGIOGRAM
Anesthesia: LOCAL

## 2011-11-01 MED ORDER — ISOSORBIDE MONONITRATE ER 30 MG PO TB24
30.0000 mg | ORAL_TABLET | Freq: Every day | ORAL | Status: DC
  Administered 2011-11-01 – 2011-11-05 (×5): 30 mg via ORAL
  Filled 2011-11-01 (×5): qty 1

## 2011-11-01 MED ORDER — HEPARIN SODIUM (PORCINE) 5000 UNIT/ML IJ SOLN
5000.0000 [IU] | Freq: Three times a day (TID) | INTRAMUSCULAR | Status: DC
  Filled 2011-11-01 (×3): qty 1

## 2011-11-01 MED ORDER — LORATADINE 10 MG PO TABS
10.0000 mg | ORAL_TABLET | Freq: Every day | ORAL | Status: DC
  Administered 2011-11-01 – 2011-11-05 (×5): 10 mg via ORAL
  Filled 2011-11-01 (×5): qty 1

## 2011-11-01 MED ORDER — LIDOCAINE HCL (PF) 1 % IJ SOLN
INTRAMUSCULAR | Status: AC
  Filled 2011-11-01: qty 30

## 2011-11-01 MED ORDER — ASPIRIN 300 MG RE SUPP: 300.0000 mg | RECTAL | Status: DC

## 2011-11-01 MED ORDER — NITROFURANTOIN MACROCRYSTAL 100 MG PO CAPS
100.0000 mg | ORAL_CAPSULE | Freq: Every day | ORAL | Status: DC
  Administered 2011-11-01 – 2011-11-04 (×4): 100 mg via ORAL
  Filled 2011-11-01 (×5): qty 1

## 2011-11-01 MED ORDER — ATROPINE SULFATE 1 MG/ML IJ SOLN
INTRAMUSCULAR | Status: AC
  Filled 2011-11-01: qty 1

## 2011-11-01 MED ORDER — ASPIRIN 81 MG PO CHEW: 324.0000 mg | CHEWABLE_TABLET | ORAL | Status: DC

## 2011-11-01 MED ORDER — TICAGRELOR 90 MG PO TABS: 90.0000 mg | ORAL_TABLET | Freq: Two times a day (BID) | ORAL | Status: DC

## 2011-11-01 MED ORDER — TICAGRELOR 90 MG PO TABS
90.0000 mg | ORAL_TABLET | Freq: Two times a day (BID) | ORAL | Status: DC
  Administered 2011-11-01 – 2011-11-05 (×8): 90 mg via ORAL
  Filled 2011-11-01 (×9): qty 1

## 2011-11-01 MED ORDER — ONDANSETRON HCL 4 MG/2ML IJ SOLN: 4.0000 mg | Freq: Four times a day (QID) | INTRAMUSCULAR | Status: DC | PRN

## 2011-11-01 MED ORDER — NITROFURANTOIN MACROCRYSTAL 100 MG PO CAPS: 100.0000 mg | ORAL_CAPSULE | Freq: Every day | ORAL | Status: DC

## 2011-11-01 MED ORDER — ACETAMINOPHEN 325 MG PO TABS: 650.0000 mg | ORAL_TABLET | ORAL | Status: DC | PRN

## 2011-11-01 MED ORDER — SODIUM CHLORIDE 0.9 % IV SOLN
INTRAVENOUS | Status: AC
  Administered 2011-11-01: 11:00:00 via INTRAVENOUS

## 2011-11-01 MED ORDER — LINAGLIPTIN 5 MG PO TABS: 5.0000 mg | ORAL_TABLET | Freq: Every day | ORAL | Status: DC

## 2011-11-01 MED ORDER — MORPHINE SULFATE 2 MG/ML IJ SOLN: 1.0000 mg | INTRAMUSCULAR | Status: DC | PRN

## 2011-11-01 MED ORDER — ASPIRIN EC 81 MG PO TBEC
81.0000 mg | DELAYED_RELEASE_TABLET | Freq: Every day | ORAL | Status: DC
  Administered 2011-11-02 – 2011-11-05 (×4): 81 mg via ORAL
  Filled 2011-11-01 (×4): qty 1

## 2011-11-01 MED ORDER — SIMVASTATIN 20 MG PO TABS: 20.0000 mg | ORAL_TABLET | Freq: Every day | ORAL | Status: DC

## 2011-11-01 MED ORDER — CLOPIDOGREL BISULFATE 75 MG PO TABS: 75.0000 mg | ORAL_TABLET | Freq: Every day | ORAL | Status: DC

## 2011-11-01 MED ORDER — LINAGLIPTIN 5 MG PO TABS
5.0000 mg | ORAL_TABLET | Freq: Every day | ORAL | Status: DC
  Administered 2011-11-02 – 2011-11-05 (×4): 5 mg via ORAL
  Filled 2011-11-01 (×5): qty 1

## 2011-11-01 MED ORDER — HEPARIN (PORCINE) IN NACL 2-0.9 UNIT/ML-% IJ SOLN
INTRAMUSCULAR | Status: AC
  Filled 2011-11-01: qty 2000

## 2011-11-01 MED ORDER — HYDRALAZINE HCL 20 MG/ML IJ SOLN: 10.0000 mg | INTRAMUSCULAR | Status: DC

## 2011-11-01 MED ORDER — HEPARIN BOLUS VIA INFUSION
4000.0000 [IU] | Freq: Once | INTRAVENOUS | Status: AC
  Administered 2011-11-01: 4000 [IU] via INTRAVENOUS

## 2011-11-01 MED ORDER — HEPARIN SODIUM (PORCINE) 5000 UNIT/ML IJ SOLN
5000.0000 [IU] | Freq: Three times a day (TID) | INTRAMUSCULAR | Status: DC
  Administered 2011-11-01 – 2011-11-05 (×10): 5000 [IU] via SUBCUTANEOUS
  Filled 2011-11-01 (×15): qty 1

## 2011-11-01 MED ORDER — DOCUSATE SODIUM 100 MG PO CAPS
200.0000 mg | ORAL_CAPSULE | Freq: Every day | ORAL | Status: DC
  Administered 2011-11-01 – 2011-11-04 (×4): 200 mg via ORAL
  Filled 2011-11-01 (×6): qty 2

## 2011-11-01 MED ORDER — NITROGLYCERIN 0.2 MG/ML ON CALL CATH LAB
INTRAVENOUS | Status: AC
  Filled 2011-11-01: qty 1

## 2011-11-01 MED ORDER — TRAMADOL HCL 50 MG PO TABS
50.0000 mg | ORAL_TABLET | Freq: Three times a day (TID) | ORAL | Status: DC | PRN
  Administered 2011-11-01: 50 mg via ORAL
  Filled 2011-11-01: qty 1

## 2011-11-01 MED ORDER — NITROGLYCERIN 0.4 MG SL SUBL: 0.4000 mg | SUBLINGUAL_TABLET | SUBLINGUAL | Status: DC | PRN

## 2011-11-01 MED ORDER — TAMSULOSIN HCL 0.4 MG PO CAPS
0.4000 mg | ORAL_CAPSULE | Freq: Every day | ORAL | Status: DC
  Administered 2011-11-01 – 2011-11-03 (×3): 0.4 mg via ORAL
  Filled 2011-11-01 (×4): qty 1

## 2011-11-01 MED ORDER — ISOSORBIDE MONONITRATE ER 30 MG PO TB24: 30.0000 mg | ORAL_TABLET | Freq: Every day | ORAL | Status: DC

## 2011-11-01 MED ORDER — TICAGRELOR 90 MG PO TABS
ORAL_TABLET | ORAL | Status: AC
  Filled 2011-11-01: qty 2

## 2011-11-01 MED ORDER — BIVALIRUDIN 250 MG IV SOLR
INTRAVENOUS | Status: AC
  Filled 2011-11-01: qty 250

## 2011-11-01 MED ORDER — CARVEDILOL 6.25 MG PO TABS
6.2500 mg | ORAL_TABLET | Freq: Two times a day (BID) | ORAL | Status: DC
  Administered 2011-11-01 – 2011-11-05 (×8): 6.25 mg via ORAL
  Filled 2011-11-01 (×12): qty 1

## 2011-11-01 MED ORDER — ATORVASTATIN CALCIUM 80 MG PO TABS
80.0000 mg | ORAL_TABLET | Freq: Every day | ORAL | Status: DC
  Administered 2011-11-01 – 2011-11-04 (×4): 80 mg via ORAL
  Filled 2011-11-01 (×5): qty 1

## 2011-11-01 MED ORDER — DOCUSATE SODIUM 100 MG PO TABS: 200.0000 mg | ORAL_TABLET | Freq: Every day | ORAL | Status: DC

## 2011-11-01 MED ORDER — RAMIPRIL 5 MG PO CAPS
5.0000 mg | ORAL_CAPSULE | Freq: Every day | ORAL | Status: DC
  Administered 2011-11-01 – 2011-11-05 (×5): 5 mg via ORAL
  Filled 2011-11-01 (×5): qty 1

## 2011-11-01 MED ORDER — PANTOPRAZOLE SODIUM 40 MG PO TBEC
40.0000 mg | DELAYED_RELEASE_TABLET | Freq: Every day | ORAL | Status: DC
  Administered 2011-11-01 – 2011-11-05 (×5): 40 mg via ORAL
  Filled 2011-11-01 (×5): qty 1

## 2011-11-01 MED ORDER — ASPIRIN EC 325 MG PO TBEC: 325.0000 mg | DELAYED_RELEASE_TABLET | Freq: Every day | ORAL | Status: DC

## 2011-11-01 MED ORDER — CARVEDILOL 6.25 MG PO TABS: 6.2500 mg | ORAL_TABLET | Freq: Two times a day (BID) | ORAL | Status: DC

## 2011-11-01 MED ORDER — ASPIRIN 81 MG PO CHEW
324.0000 mg | CHEWABLE_TABLET | Freq: Once | ORAL | Status: AC
  Administered 2011-11-01: 324 mg via ORAL

## 2011-11-01 MED ORDER — BISMUTH SUBSALICYLATE 262 MG PO CHEW: 262.0000 mg | CHEWABLE_TABLET | ORAL | Status: DC | PRN

## 2011-11-01 NOTE — Progress Notes (Signed)
ARTERIAL SHEATH REMOVAL NOTE  RN removed R fem arterial sheath from cath lab per MD order.  Manual pressure was held on R groin for 30 minutes and site level 0 after pull.  Pt tolerated procedure well and vitals remained stable.  R dorsalis pedis pulse palpated +1. RN instructed pt in groin care for bedrest and pt verbalized understanding.   RN will continue to monitor pt and groin closely.

## 2011-11-01 NOTE — Progress Notes (Signed)
CRITICAL VALUE ALERT  Critical value received:  Troponin- 0.44 and CKMB- 8.6  Date of notification:  11-01-2011  Time of notification:  1200  Critical value read back:yes  Nurse who received alert:  Delice Bison  MD notified (1st page):  Bensihmon  Time of first page:  1202  MD notified (2nd page):  Time of second page:  Responding MD:  Bensihmon  Time MD responded:  1205

## 2011-11-01 NOTE — Op Note (Signed)
Jeffrey [name] is a 78 y.o. male    [id number] LOCATION:  FACILITY: [facility]  PHYSICIAN: Nanetta Batty, M.D. 1928-11-09   DATE OF PROCEDURE:  11/01/2011  DATE OF DISCHARGE:  [facility]  CARDIAC CATHETERIZATION     History obtained from chart review. Jeffrey [name] is a 78 year old gentleman with history of remote bypass grafting. He is a patient of our heart group. He has had a stent placed to his circumflex obtuse marginal branch a vein graft in the past. His last cath in October of last year and was found to have occluded continuation of his finger off and on 2 and 3, patent LIMA to the LAD and occluded RCA vein graft. All his native vessels were occluded and he had normal LV function at that time. He developed chest pain last night was seen at [facility] where he was found to have minimal ST segment elevation with ongoing chest pain. Transported by EMS and brought to the cath lab for urgent cath and potential intervention.   PROCEDURE DESCRIPTION:    The patient was brought to the second floor  [facility] lab in the postabsorptive state. He was not  premedicated . His right groin was prepped and shaved in usual sterile fashion. Xylocaine 1% was used  for local anesthesia. A 6 French sheath was inserted into the right common femoral  artery using standard Seldinger technique. 6 French right and left Judkins diagnostic catheters all of a sudden and pigtail catheter were used for selective coronary angiography, left ventriculography and selective vein graft angiography. Visipaque dye was used for the entirety of the case. Retrograde aortic, left ventricular pullback pressures were recorded.    HEMODYNAMICS:    AO SYSTOLIC/AO DIASTOLIC: see cath lab flow   LV SYSTOLIC/LV DIASTOLIC: the cath lab flow  ANGIOGRAPHIC RESULTS:   1. Left main; 40% distal  2. LAD; 99% proximal followed by a long 75% segmental proximal. 3. Left circumflex;  occluded in the midportion.  4. Right coronary artery; dominant and occluded in the midportion 5.LIMA TO LAD; patent 6. SVG TO RCA was occluded at the origin which is an old finding     SVG TO OM 2 and was patent with occluded continuation. There was a proximal stent in the vein graft there was noted to have a 99% in-stent restenosis.   7. Left ventriculography; RAO left ventriculogram was performed using  25 mL of Visipaque dye at 12 mL/second. The overall LVEF estimated  45 %  With wall motion abnormalities notable for moderate inferobasal hypokinesia  IMPRESSION:Jeffrey [name] has a subtotally occluded proximal SVG to OM 2 "in-stent restenosis". This is his "culprit vessel" to proceed with PCI and restenting using Intermedics, drug-eluting stent, and Brilenta.  Procedure description: The patient received baby aspirin prior to the procedure, Brilenta 180 mg load and Intimax bolus with an ACT of 424.a total of 166 5 cc of contrast was administered to the patient. Using a 6 [place] JR 4 with sideholes guide catheter low at 0.14/190 cm length Asahi soft wire and a 2.0 mm x 12 mm long emerge balloon predilatation was performed. Stenting was then performed with a 2.75 mm x 18 mm long resolute drug-eluting stent deployed at 14 atmospheres and postdilated with a 3.25 mm x 15 mm long [place] sprinter at 14 atmospheres. The proximal graft was then stented with a 30 by 12 mm resolute drug-eluting stent at 16 atmospheres resulting in reduction of a 99% in-stent restenosis  to 0% residual with TIMI-3 flow. The patient tolerated the procedure well. The guidewire and catheter were removed and the sheath was sewn securely in place. Plans will be to continue antiemetics a reduced dose until the bag is complete. Patient will be gently hydrated, 2 of aspirin Advil and the period he will be fast track and anticipation for discharge and 48-72 hours.  Final impression: Successful PCI and restenting of a circumflex obtuse marginal  branch stent for in-stent restenosis with overlapping drug-eluting stents. The remainder of the patient's care will be carried out by [employer].   Jeffrey [name], M.D., [facility] [address] [place], [place]  [address]  [phone] 11/01/2011 11:46 AM

## 2011-11-01 NOTE — H&P (Signed)
   Pt was reexamined and existing H & P reviewed. No changes found.  Runell Gess, MD [facility] 11/01/2011 10:29 AM

## 2011-11-01 NOTE — ED Provider Notes (Signed)
History     CSN: [id number]  Arrival date & time 11/01/11  1002   First MD Initiated Contact with Patient 11/01/11 1018      Chief Complaint  Patient presents with  . Code STEMI    (Consider location/radiation/quality/duration/timing/severity/associated sxs/prior treatment) The history is provided by the patient and the EMS personnel.   78 year old male was brought in by EMS with evidence of STEMI : ECG done in the ambulance. He will relates that he had an episode of chest pain yesterday at about noon. He describes a feeling like a band around his chest but it only lasted about 5 minutes before resolving. There was initially no nausea, vomiting, dyspnea, or diaphoresis. Yesterday evening, he started having more problems with dyspnea and feeling generally weak but had no further chest pain. He called for an ambulance this morning because of weakness and dyspnea, but his weakness was his major complaint. EMS noted STEMI and activated code STEMI. He he continues to be pain free with only complaints of mild dyspnea and generalized weakness. He does have a history of prior coronary artery bypass. Nothing made his symptoms better nothing made his symptoms worse. EMS gave aspirin in the field but he did not receive nitroglycerin because he was not having active chest pain.  Past Medical History  Diagnosis Date  . Diabetes mellitus   . Hypercholesterolemia   . Coronary artery disease     a.  s/p CABG x 3 1989, VG->LCX, VG->RCA, LIMA->LAD;  b. 05/24/10 Cath/PCI: 3vd, LIMA patent, VG->RCA occluded, VG->LCX 90 in graft (3.0x15 promus) & 90 in distal LCX (2.75x15 Promus);  c. NSTEMI in 04/2011 - Cath occlusion of distal LCX stent.   . Coronary artery disease     a.  s/p CABG 1999;  b. s/p stenting to distal LCx, s/p stent to proximal SVG-LCx in 05/2010;  c. NSTEMI in 04/2011 with cath showing occluded OM2 graft   . Diabetes mellitus   . Hypertension   . PUD (peptic ulcer disease)   .  Hypercholesteremia   . Myocardial infarct, old   . GERD (gastroesophageal reflux disease)   . Arthritis   . Osteoarthritis   . Stroke   . Pneumonia     Past Surgical History  Procedure Date  . Coronary angioplasty with stent placement   . Tonsillectomy   . Post aortocoronary bypass surgery   . Coronary artery bypass graft   . Coronary stent placement   . Coronary angioplasty     History reviewed. No pertinent family history.  History  Substance Use Topics  . Smoking status: Former Smoker    Types: Cigarettes  . Smokeless tobacco: Current User    Types: Chew  . Alcohol Use: No      Review of Systems  All other systems reviewed and are negative.    Allergies  Review of patient's allergies indicates no known allergies.  Home Medications  No current outpatient prescriptions on file.  BP 154/70  Temp(Src) 97.8 F (36.6 C) (Axillary)  Resp 24  SpO2 100%  Physical Exam  Nursing note and vitals reviewed.  78 year old male who is resting comfortably and in no acute distress. Vital signs are significant for mild tachypnea with respiratory rate of 24 and mild hypertension with blood pressure 154/70. Oxygen saturation is 100% which is normal, but this is while he is on a nonrebreather mask. Head is normocephalic and atraumatic. PERRLA, EOMI. Neck is supple without adenopathy or JVD. Lungs are clear without rales,  wheezes, rhonchi. Heart has regular rate and rhythm without murmur. Abdomen is soft, flat, nontender without masses or hepatosplenomegaly. Extremities have evidence of a venous stasis changes but no cyanosis or edema and range of motion is full. Skin is somewhat pale but without rash. Neurologic: Mental status is normal, cranial nerves are intact, there no focal motor or sensory deficits.   ED Course  Procedures (including critical care time)  Results for orders placed during the hospital encounter of 11/01/11  CARDIAC PANEL(CRET KIN+CKTOT+MB+TROPI)       Component Value Range   Total CK 193  7 - 232 (U/L)   CK, MB 8.6 (*) 0.3 - 4.0 (ng/mL)   Troponin I 0.44 (*) <0.30 (ng/mL)   Relative Index 4.5 (*) 0.0 - 2.5   PROTIME-INR      Component Value Range   Prothrombin Time 13.6  11.6 - 15.2 (seconds)   INR 1.02  0.00 - 1.49   APTT      Component Value Range   aPTT 29  24 - 37 (seconds)  COMPREHENSIVE METABOLIC PANEL      Component Value Range   Sodium 132 (*) 135 - 145 (mEq/L)   Potassium 4.5  3.5 - 5.1 (mEq/L)   Chloride 98  96 - 112 (mEq/L)   CO2 23  19 - 32 (mEq/L)   Glucose, Bld 204 (*) 70 - 99 (mg/dL)   BUN 14  6 - 23 (mg/dL)   Creatinine, Ser 1.19  0.50 - 1.35 (mg/dL)   Calcium 9.7  8.4 - 14.7 (mg/dL)   Total Protein 6.9  6.0 - 8.3 (g/dL)   Albumin 3.9  3.5 - 5.2 (g/dL)   AST 19  0 - 37 (U/L)   ALT 19  0 - 53 (U/L)   Alkaline Phosphatase 80  39 - 117 (U/L)   Total Bilirubin 0.3  0.3 - 1.2 (mg/dL)   GFR calc non Af Amer 78 (*) >90 (mL/min)   GFR calc Af Amer >90  >90 (mL/min)  CBC      Component Value Range   WBC 8.3  4.0 - 10.5 (K/uL)   RBC 4.09 (*) 4.22 - 5.81 (MIL/uL)   Hemoglobin 12.7 (*) 13.0 - 17.0 (g/dL)   HCT 82.9 (*) 56.2 - 52.0 (%)   MCV 90.0  78.0 - 100.0 (fL)   MCH 31.1  26.0 - 34.0 (pg)   MCHC 34.5  30.0 - 36.0 (g/dL)   RDW 13.0  86.5 - 78.4 (%)   Platelets 190  150 - 400 (K/uL)  DIFFERENTIAL      Component Value Range   Neutrophils Relative 77  43 - 77 (%)   Neutro Abs 6.4  1.7 - 7.7 (K/uL)   Lymphocytes Relative 11 (*) 12 - 46 (%)   Lymphs Abs 0.9  0.7 - 4.0 (K/uL)   Monocytes Relative 9  3 - 12 (%)   Monocytes Absolute 0.7  0.1 - 1.0 (K/uL)   Eosinophils Relative 3  0 - 5 (%)   Eosinophils Absolute 0.2  0.0 - 0.7 (K/uL)   Basophils Relative 0  0 - 1 (%)   Basophils Absolute 0.0  0.0 - 0.1 (K/uL)  POCT I-STAT TROPONIN I      Component Value Range   Troponin i, poc 0.54 (*) 0.00 - 0.08 (ng/mL)   Comment NOTIFIED PHYSICIAN     Comment 3           LIPID PANEL      Component Value  Range    Cholesterol 119  0 - 200 (mg/dL)   Triglycerides 409  <811 (mg/dL)   HDL 35 (*) >91 (mg/dL)   Total CHOL/HDL Ratio 3.4     VLDL 26  0 - 40 (mg/dL)   LDL Cholesterol 58  0 - 99 (mg/dL)  GLUCOSE, CAPILLARY      Component Value Range   Glucose-Capillary 159 (*) 70 - 99 (mg/dL)   Dg Chest 2 View  4/78/2956  *RADIOLOGY REPORT*  Clinical Data: Cough, CHF.  CHEST - 2 VIEW  Comparison: None  Findings: There is hyperinflation of the lungs compatible with COPD.  Prior CABG. Heart and mediastinal contours are within normal limits.  No focal opacities or effusions.  No acute bony abnormality.  IMPRESSION: COPD.  No active disease.  Original Report Authenticated By: Cyndie Chime, M.D.    Date: 11/01/2011  Rate: 83  Rhythm: normal sinus rhythm  QRS Axis: normal  Intervals: normal  ST/T Wave abnormalities: ST elevation in lead 3, ST depression in lead 1 and aVL, borderline ST elevation in leads V 4, V5, V6, T wave inversions in leads 2 and aVF  Conduction Disutrbances:none  Narrative Interpretation:  ST elevation and T wave inversion concerning for acute myocardial infarction  Old EKG Reviewed: changes noted      1. Chest pain   2. Dyspnea   3. Weakness   4. Coronary artery disease    CRITICAL CARE Performed by: Dione Booze   Total critical care time: 35 minutes  Critical care time was exclusive of separately billable procedures and treating other patients.  Critical care was necessary to treat or prevent imminent or life-threatening deterioration.  Critical care was time spent personally by me on the following activities: development of treatment plan with patient and/or surrogate as well as nursing, discussions with consultants, evaluation of patient's response to treatment, examination of patient, obtaining history from patient or surrogate, ordering and performing treatments and interventions, ordering and review of laboratory studies, ordering and review of radiographic studies,  pulse oximetry and re-evaluation of patient's condition.    MDM  Dr. Gala Romney of cardiology service was present and evaluated the patient with me. ECG does show ST elevation in lead 3. Dr. Gala Romney was familiar with his cardiac anatomy do to review of prior catheterization reports and felt that he should be taken to the catheterization lab even though he is currently pain free and does not meet the technical requirements of code STEMI. Dr. Gery Pray he is also here and the patient is taken to the catheterization lab.        Dione Booze, MD 11/01/11 1214

## 2011-11-01 NOTE — H&P (Addendum)
CC:  Near syncope, abnormal ECG  HPI: 8 Jeffrey [name] is an 78 y/o male with PMH of CAD s/p remote CABG and multiple PCIs who presents with recurrent near syncope and abnormal ECG.    Has been admitted twice this month with near syncope and weakness. First time trop 0.16. Last week admitted again troponin peaked at 2.7. I reviewed his cath films at that time and felt he should be managed medically given that he was pain free and had multiple areas for potential small vessel ischemia. We recommended switching plavix to Brillinta as P2Y12 activity was not fully suppressed. By his med list it seems that this was done.  He reports he had 5-10 minutes of mild CP and SOB yesterday at lunch which resolved. Today again had near syncope while going to the bathroom and called EMS. Initial ECG in field with mild ST elevation inferiorly so code STEMI activated.  On arrival to ED denies any CP or SOB. ECG with minimal ST elevation in III (new) and new q waves III, F.  Historically, is he s/p CABG with LIMA-LAD, SVG-RCA, and SVG-OM.  In 05/2010 he had a 3x15 promus DES placed in the body of the SVG to Cx and 2.25x15 promus in the distal OM (via SVG).  He was cathed again in 07/2010 and 04/2011 for small NSTEMIs.  Most recent cath 10/12 demonstrated 100% RCA, 100% Cx, 80% LAD, chronically occluded SVG-RCA, LIMA-LAD patent, and patent SVG to Cx however, the distal stent was occluded.  On ROS: denies fever, chills, bleeding, orthopnea, PND, edema, palpitations, full syncope, focal neuro sx, diarrhea. + arthritis and weakness.     Past Medical History  Diagnosis Date  . Diabetes mellitus   . Hypercholesterolemia   . Coronary artery disease     a.  s/p CABG x 3 1989, VG->LCX, VG->RCA, LIMA->LAD;  b. 05/24/10 Cath/PCI: 3vd, LIMA patent, VG->RCA occluded, VG->LCX 90 in graft (3.0x15 promus) & 90 in distal LCX (2.75x15 Promus);  c. NSTEMI in 04/2011 - Cath occlusion of distal LCX stent.   . Coronary artery  disease     a.  s/p CABG 1999;  b. s/p stenting to distal LCx, s/p stent to proximal SVG-LCx in 05/2010;  c. NSTEMI in 04/2011 with cath showing occluded OM2 graft   . Diabetes mellitus   . Hypertension   . PUD (peptic ulcer disease)   . Hypercholesteremia   . Myocardial infarct, old   . GERD (gastroesophageal reflux disease)   . Arthritis   . Osteoarthritis   . Stroke   . Pneumonia     History   Social History  . Marital Status: Divorced    Spouse Name: N/A    Number of Children: N/A  . Years of Education: N/A   Occupational History  . retired     Visual merchandiser   Social History Main Topics  . Smoking status: Former Smoker    Types: Cigarettes  . Smokeless tobacco: Current User    Types: Chew  . Alcohol Use: No  . Drug Use: No  . Sexually Active: No   Other Topics Concern  . Not on file   Social History Narrative   ** Merged History Encounter **     History reviewed. No pertinent family history. Family History: Patient reports that he doesn't know anything about his family members medical care.   Medications Prior to Admission  Medication Dose Route Frequency Provider Last Rate Last Dose  . aspirin chewable  tablet 324 mg  324 mg Oral Once Dione Booze, MD   324 mg at 11/01/11 1013  . heparin bolus via infusion 4,000 Units  4,000 Units Intravenous Once Dione Booze, MD   4,000 Units at 11/01/11 1018   Medications Prior to Admission  Medication Sig Dispense Refill  . aspirin 325 MG tablet Take 325 mg by mouth daily. For arthritis pain      . aspirin EC 325 MG tablet Take 325 mg by mouth daily.      Marland Kitchen aspirin-sod bicarb-citric acid (ALKA-SELTZER) 325 MG TBEF Take 325 mg by mouth daily as needed. For heartburn      . Bismuth Subsalicylate (PEPTO-BISMOL PO) Take by mouth daily as needed. Patient states that he just takes a couple of swallows when needed to control bowels.       . calcium carbonate (TUMS - DOSED IN MG ELEMENTAL CALCIUM) 500 MG chewable tablet Chew 2 tablets  by mouth daily as needed.      . carvedilol (COREG) 6.25 MG tablet Take 6.25 mg by mouth 2 (two) times daily with a meal.       . carvedilol (COREG) 6.25 MG tablet Take 6.25 mg by mouth 2 (two) times daily with a meal.      . clopidogrel (PLAVIX) 75 MG tablet Take 1 tablet (75 mg total) by mouth daily.  30 tablet  2  . COD LIVER OIL PO Take 1 capsule by mouth daily.        Tery Sanfilippo Sodium (STOOL SOFTENER) 100 MG capsule Take 200-300 mg by mouth at bedtime. Constipation      . isosorbide mononitrate (IMDUR) 30 MG 24 hr tablet Take 1 tablet (30 mg total) by mouth daily.  30 tablet  6  . isosorbide mononitrate (IMDUR) 30 MG 24 hr tablet Take 1 tablet (30 mg total) by mouth daily.  30 tablet  6  . loratadine (CLARITIN) 10 MG tablet Take 10 mg by mouth daily.        . meclizine (ANTIVERT) 25 MG tablet Take 25 mg by mouth daily as needed. For dizziness      . metFORMIN (GLUCOPHAGE) 500 MG tablet Take 500 mg by mouth 2 (two) times daily.       . metFORMIN (GLUCOPHAGE) 500 MG tablet Take 500 mg by mouth 2 (two) times daily with a meal.      . nitrofurantoin (MACRODANTIN) 100 MG capsule Take 100 mg by mouth at bedtime.      . nitrofurantoin (MACRODANTIN) 100 MG capsule Take 100 mg by mouth at bedtime.      . nitroGLYCERIN (NITROSTAT) 0.4 MG SL tablet Place 0.4 mg under the tongue every 5 (five) minutes as needed. Chest pain/patient said he has never had to use medication as he takes Rolaids if he gets chest pain and that stops pain       . nitroGLYCERIN (NITROSTAT) 0.4 MG SL tablet Place 1 tablet (0.4 mg total) under the tongue every 5 (five) minutes x 3 doses as needed for chest pain.  10 tablet  0  . pantoprazole (PROTONIX) 40 MG tablet Take 40 mg by mouth daily. **Take one tablet daily one hour before breakfast**      . pantoprazole (PROTONIX) 40 MG tablet Take 40 mg by mouth daily.      . ramipril (ALTACE) 5 MG capsule Take 5 mg by mouth daily.        . ramipril (ALTACE) 5 MG capsule Take 5 mg by  mouth daily.      . rosuvastatin (CRESTOR) 10 MG tablet Take 10 mg by mouth at bedtime.       . saxagliptin HCl (ONGLYZA) 5 MG TABS tablet Take 5 mg by mouth at bedtime.       . saxagliptin HCl (ONGLYZA) 5 MG TABS tablet Take 5 mg by mouth daily.      . simvastatin (ZOCOR) 20 MG tablet Take 1 tablet (20 mg total) by mouth daily at 6 PM.  30 tablet  0  . Tamsulosin HCl (FLOMAX) 0.4 MG CAPS Take 0.4 mg by mouth at bedtime.       . Tamsulosin HCl (FLOMAX) 0.4 MG CAPS Take 0.4 mg by mouth at bedtime.      . Ticagrelor (BRILINTA) 90 MG TABS tablet Take 1 tablet (90 mg total) by mouth 2 (two) times daily.  60 tablet  0  . traMADol (ULTRAM) 50 MG tablet Take 1 tablet (50 mg total) by mouth every 8 (eight) hours as needed for pain.  30 tablet  0    No Known Allergies  Review of Systems: All systems reviewed and are negative except as mentioned above in the history of present illness.    PHYSICAL EXAM: Filed Vitals:   11/01/11 1013  BP: 154/70  Temp: 97.8 F (36.6 C)  Resp: 24   GENERAL: No acute distress.  Elderly HEENT: Normocephalic, atraumatic.  Oropharynx is pink and moist without lesions. Wearing facemask. NECK: Supple, no LAD, no JVD, no masses. CV: Regular rate and rhythm with no murmurs, rubs, or gallops.   LUNGS: Clear to auscultation bilaterally.   ABDOMEN: +BS, soft, nontender, nondistended.  EXTREMITIES: No clubbing, cyanosis, or edema.   NEURO: AO x 3, no focal deficits. PYSCH: Normal affect. SKIN: No rashes.    ECG: NSR at 70,  ~23mm ST elevation in III. New small Qs III, F  Inf-lat TWI  No results found for this or any previous visit (from the past 24 hour(s)). No results found.   ASSESSMENT:   1. Acute coronary syndrome 2. Recurrent near syncope - suspect neurocardiogenic in nature 3. CAD as described above 4. DM2 5. Weakness 6. H/o CVA  PLAN/DISCUSSION:  Currently is pain free and ECG does NOT meet STEMI criteria however ECG is suggestive of ischemia and I  am worried that the SVG to his OM-1 may be threatened. I have d/w Dr. Allyson Sabal and we will take him to the cath lab urgently (NOT UNDER STEMI CONDITIONS) for evaluation. Heparin has been started in ER and ASA given. Continue Brillinta. Hold metformin.  Cover DM2 wth SSI.  Will discuss syncope with EP. Suspect neurocardiogenic in nature. Given multiple recent admits may benefit from brief SNF or rehab stay.   Jeffrey Mungo,MD 10:35 AM  Addendum: DM2 well controlled with HgbA1c = 7.6  Jeffrey Eslick,MD 11:50 AM

## 2011-11-01 NOTE — ED Notes (Signed)
Per EMS, chest pain that started last night. sts woke up this am with extreme SOB.

## 2011-11-02 DIAGNOSIS — I214 Non-ST elevation (NSTEMI) myocardial infarction: Principal | ICD-10-CM

## 2011-11-02 LAB — CARDIAC PANEL(CRET KIN+CKTOT+MB+TROPI)
CK, MB: 6.8 ng/mL (ref 0.3–4.0)
Total CK: 149 U/L (ref 7–232)
Troponin I: 0.73 ng/mL (ref <0.30)

## 2011-11-02 LAB — GLUCOSE, CAPILLARY
Glucose-Capillary: 169 mg/dL — ABNORMAL HIGH (ref 70–99)
Glucose-Capillary: 165 mg/dL — ABNORMAL HIGH (ref 70–99)
Glucose-Capillary: 159 mg/dL — ABNORMAL HIGH (ref 70–99)

## 2011-11-02 LAB — CBC
Hemoglobin: 12.7 g/dL — ABNORMAL LOW (ref 13.0–17.0)
MCH: 30.8 pg (ref 26.0–34.0)
MCHC: 34.4 g/dL (ref 30.0–36.0)

## 2011-11-02 LAB — BASIC METABOLIC PANEL
BUN: 10 mg/dL (ref 6–23)
Calcium: 9 mg/dL (ref 8.4–10.5)
GFR calc non Af Amer: 79 mL/min — ABNORMAL LOW (ref >90)
Glucose, Bld: 176 mg/dL — ABNORMAL HIGH (ref 70–99)
Sodium: 137 mEq/L (ref 135–145)

## 2011-11-02 MED ORDER — INSULIN ASPART 100 UNIT/ML ~~LOC~~ SOLN
0.0000 [IU] | Freq: Three times a day (TID) | SUBCUTANEOUS | Status: DC
  Administered 2011-11-02 – 2011-11-03 (×2): 5 [IU] via SUBCUTANEOUS
  Administered 2011-11-03 – 2011-11-04 (×2): 3 [IU] via SUBCUTANEOUS
  Administered 2011-11-04 – 2011-11-05 (×3): 5 [IU] via SUBCUTANEOUS
  Administered 2011-11-05: 3 [IU] via SUBCUTANEOUS

## 2011-11-02 NOTE — Evaluation (Signed)
Physical Therapy Evaluation Patient Details Name: Jeffrey [name] MRN: [id number] DOB: 1929/05/17 Today's Date: 11/02/2011 Time: 4098-1191 PT Time Calculation (min): 41 min  PT Assessment / Plan / Recommendation Clinical Impression  78 y/o male with PMH of CAD s/p remote CABG and multiple PCIs who presents with recurrent near syncope and abnormal ECG. Underwent cardiac cath with stent placement. Pt currently is severly limited in functional mobility due to symptomatic orthostatic hypotension that was not mediated by leg exercises prior to and with changes in position. Will benefit from PT once orthostasis controlled. Will follow.    PT Assessment  Patient needs continued PT services; may benefit from TED hose and abdominal binder    Follow Up Recommendations  Skilled nursing facility;Other (comment) (anticipate pt will refuse SNF, although safest option)    Equipment Recommendations  None recommended by PT    Frequency Min 3X/week    Precautions / Restrictions Precautions Precautions: Fall;Other (comment) (orthostasis)   Pertinent Vitals/Pain See vitals section for +orthostatics      Mobility  Bed Mobility Bed Mobility: Rolling Left;Left Sidelying to Sit;Supine to Sit;Sitting - Scoot to Edge of Bed;Sit to Supine Left Sidelying to Sit: 5: Supervision;HOB flat Supine to Sit: 4: Min assist;HOB flat (pt kicks legs for momentum and still needed assist to raise ) Sitting - Scoot to Edge of Bed: 5: Supervision Sit to Supine: 5: Supervision;HOB flat Details for Bed Mobility Assistance: Pt kicks legs for momentum (when going supine to sit) and still needed assist to raise torso; Pt became dizzy upon sitting EOB (+orthostasis--see vitals); ultimately returned to supine and after LE exercises came to sitting again via sidelying; +orthostasis again and returned to supine Transfers Transfers: Not assessed (due to orthostasis)    Exercises General Exercises - Lower Extremity Ankle  Circles/Pumps: AROM;Both;20 reps;Seated;Supine (for incr BP/circulation) Long Arc Quad: AROM;Both;20 reps;Seated Heel Slides: AROM;Both;10 reps;Supine Toe Raises: AROM;Both;10 reps;Seated Heel Raises: AROM;Both;10 reps;Seated   PT Goals Acute Rehab PT Goals PT Goal Formulation: With patient Time For Goal Achievement: 11/09/11 Potential to Achieve Goals: Fair (depending on orthostasis) Pt will Roll Supine to Left Side: with modified independence PT Goal: Rolling Supine to Left Side - Progress: Goal set today Pt will go Supine/Side to Sit: with modified independence;with HOB 0 degrees PT Goal: Supine/Side to Sit - Progress: Goal set today Pt will go Sit to Stand: with modified independence;with upper extremity assist PT Goal: Sit to Stand - Progress: Goal set today Pt will go Stand to Sit: with modified independence;with upper extremity assist PT Goal: Stand to Sit - Progress: Goal set today Pt will Ambulate: >150 feet;with modified independence;with least restrictive assistive device;Other (comment) (device that will work in his mobile home) PT Goal: Ambulate - Progress: Goal set today Pt will Go Up / Down Stairs: 3-5 stairs;with supervision;with rail(s) PT Goal: Up/Down Stairs - Progress: Goal set today Pt will Perform Home Exercise Program: with supervision, verbal cues required/provided PT Goal: Perform Home Exercise Program - Progress: Goal set today  Visit Information  Last PT Received On: 11/02/11 Assistance Needed: +2    Subjective Data  Subjective: "Just remember if you're ever considering suicide, you can come by and I'll talk you to death" laughing Patient Stated Goal: Wants to go home, but really wants to fix his dizziness   Prior Functioning  Home Living Lives With: Alone Available Help at Discharge: Friend(s) (drive him where he needs to go) Type of Home: Mobile home Home Access: Stairs to enter [employer] of  Steps: 3 Entrance Stairs-Rails: Right Home  Layout: One level Bathroom Shower/Tub: Tub/shower unit (recently sits on edge of tub and uses pan to bathe) Bathroom Toilet: Standard Bathroom Accessibility: No Home Adaptive Equipment: Straight cane;Walker - rolling;Bedside commode/3-in-1 Additional Comments:  reports dizziness x 6 mos; has caused him to stop driving; couldn't use RW in his mobile home due to too narrow;  Prior Function Level of Independence: Independent (reports furniture walks; reports 1 fall in 6 mos (trip)) Able to Take Stairs?: Yes Driving: No (has recently stopped due to dizziness) Vocation: Retired Musician: No difficulties Dominant Hand: Right    Cognition  Overall Cognitive Status: Appears within functional limits for tasks assessed/performed Arousal/Alertness: Awake/alert Behavior During Session: Jeffrey [name] for tasks performed Safety/Judgement:  (good awareness of dizziness and risk of falling)    Extremity/Trunk Assessment Right Upper Extremity Assessment RUE ROM/Strength/Tone: Within functional levels Left Upper Extremity Assessment LUE ROM/Strength/Tone: Within functional levels Right Lower Extremity Assessment RLE ROM/Strength/Tone: Within functional levels Left Lower Extremity Assessment LLE ROM/Strength/Tone: Within functional levels Trunk Assessment Trunk Assessment: Normal   Balance    End of Session PT - End of Session Activity Tolerance: Treatment limited secondary to medical complications (Comment) (limited by symptomatic orthostasis) Patient left: in bed;with call bell/phone within reach Nurse Communication: Mobility status;Other (comment) (+ orthostasis)   Jeffrey [name] 11/02/2011, 3:16 PM  Pager [phone]

## 2011-11-02 NOTE — Progress Notes (Signed)
Patient Name: Jeffrey [name] Date of Encounter: 11/02/2011       SUBJECTIVE  No chest pain/sob.  In good spirits.  No groin complaints.  CURRENT MEDS    . aspirin EC  81 mg Oral Daily  . atorvastatin  80 mg Oral q1800  . carvedilol  6.25 mg Oral BID WC  . docusate sodium  200 mg Oral QHS  . heparin  5,000 Units Subcutaneous Q8H  . hydrALAZINE  10 mg Intravenous UD  . isosorbide mononitrate  30 mg Oral Daily  . linagliptin  5 mg Oral Daily  . loratadine  10 mg Oral Daily  . nitrofurantoin  100 mg Oral QHS  . pantoprazole  40 mg Oral Daily  . ramipril  5 mg Oral Daily  . Tamsulosin HCl  0.4 mg Oral QHS  . Ticagrelor  90 mg Oral BID  . DISCONTD: heparin  5,000 Units Subcutaneous Q8H   OBJECTIVE  Filed Vitals:   11/02/11 0600 11/02/11 0700 11/02/11 0800 11/02/11 0900  BP: 111/71 122/64  113/73  Pulse: 73 70    Temp:   98.4 F (36.9 C)   TempSrc:   Oral   Resp: 15 14 16 15   Height:      Weight:      SpO2: 96% 98%      Intake/Output Summary (Last 24 hours) at 11/02/11 1214 Last data filed at 11/02/11 0700  Gross per 24 hour  Intake  934.1 ml  Output   2225 ml  Net -1290.9 ml   Filed Weights   11/01/11 1200  Weight: 171 lb 1.2 oz (77.6 kg)   PHYSICAL EXAM  General: Pleasant, NAD. Neuro: Alert and oriented X 3. Moves all extremities spontaneously. Psych: Normal affect. HEENT:  Normal  Neck: Supple without bruits or JVD. Lungs:  Resp regular and unlabored, CTA. Heart: RRR no s3, s4, or murmurs. Abdomen: Soft, non-tender, non-distended, BS + x 4.  Extremities: No clubbing, cyanosis or edema. DP/PT/Radials 2+ and equal bilaterally.  No bleeding/bruit/hematoma of right groin cath site.  Accessory Clinical Findings  CBC  Basename 11/02/11 0642 11/01/11 1200 11/01/11 1018  WBC 7.1 6.7 --  NEUTROABS -- -- 6.4  HGB 12.7* 12.3* --  HCT 36.9* 35.6* --  MCV 89.6 89.0 --  PLT 186 188 --   Basic Metabolic Panel  Basename 11/02/11 0642 11/01/11 1135    NA 137 132*  K 4.4 4.5  CL 102 99  CO2 25 20  GLUCOSE 176* 169*  BUN 10 13  CREATININE 0.87 0.80  CALCIUM 9.0 9.3  MG -- --  PHOS -- --   Liver Function Tests  Basename 11/01/11 1018  AST 19  ALT 19  ALKPHOS 80  BILITOT 0.3  PROT 6.9  ALBUMIN 3.9   Cardiac Enzymes  Basename 11/01/11 2335 11/01/11 1951 11/01/11 1728  CKTOTAL 149 143 153  CKMB 6.8* 7.0* 7.5*  CKMBINDEX -- -- --  TROPONINI 0.73* 0.96* 1.05*   Hemoglobin A1C  Basename 11/01/11 1042  HGBA1C 7.6*   Fasting Lipid Panel  Basename 11/01/11 1042  CHOL 119  HDL 35*  LDLCALC 58  TRIG 409  CHOLHDL 3.4  LDLDIRECT --   TELE  rsr.  ECG  Rsr, 70, antlat st dep/lat twi.  Inf infarct with slight st elevation in lead III.  Cath  1. Left main; 40% distal   2. LAD; 99% proximal followed by a long 75% segmental proximal. 3. Left circumflex; occluded in the midportion.  4. Right coronary artery; dominant and occluded in the midportion 5.LIMA TO LAD; patent 6. SVG TO RCA was occluded at the origin which is an old finding.  SVG TO OM 2 and was patent with occluded continuation. There was a proximal stent in the vein graft there was noted to have a 99% in-stent restenosis.  - **Stented w/ 2.75x22mm Resolute DES**   7. Left ventriculography; RAO left ventriculogram was performed using  25 mL of Visipaque dye at 12 mL/second. The overall LVEF estimated 45 %  With wall motion abnormalities notable for moderate inferobasal hypokinesia  Radiology/Studies  Dg Chest Portable 1 View  11/01/2011  *RADIOLOGY REPORT*  Clinical Data: Code STEMI.  History diabetes, CAD, hypertension. History of smoking.  PORTABLE CHEST - 1 VIEW  Comparison: 10/20/2011  Findings: The patient has had median sternotomy CABG.  Heart is mildly enlarged.  No focal consolidations or pleural effusions.  No pulmonary edema.  Degenerative changes are seen in the thoracic spine.  IMPRESSION:  1.  Mild cardiomegaly. 2.  No edema or focal pulmonary  abnormality.  Original Report Authenticated By: Patterson Hammersmith, M.D.   ASSESSMENT AND PLAN  1.  NSTEMI/CAD:  S/p PCI/DES VG->RCA.  CE +.  No chest pain overnight.  Cont asa, ticagrelor, bb, acei, statin.  Transfer to stepdown.  Ambulate.  Possible d/c tomorrow.  2.  HTN:  Stable.  3.  HL:  ldl 58.  Cont statin.  4.  DM:  Cont home regimen.  A1C 7.5.  Add SSI.  Signed, Nicolasa Ducking NP Patient seen and examined. I agree with the assessment and plan as detailed above. See also my additional thoughts below.   Patient continues to improve after non-STEMI and intervention. He is to be moved to step down. If he ambulates adequately he can possibly go home tomorrow.  [name], MD, [facility] 11/02/2011 2:31 PM

## 2011-11-03 DIAGNOSIS — I2510 Atherosclerotic heart disease of native coronary artery without angina pectoris: Secondary | ICD-10-CM

## 2011-11-03 LAB — GLUCOSE, CAPILLARY: Glucose-Capillary: 195 mg/dL — ABNORMAL HIGH (ref 70–99)

## 2011-11-03 LAB — POCT I-STAT, CHEM 8
Calcium, Ion: 1.27 mmol/L (ref 1.12–1.32)
Creatinine, Ser: 1 mg/dL (ref 0.50–1.35)
Glucose, Bld: 208 mg/dL — ABNORMAL HIGH (ref 70–99)
Hemoglobin: 13.3 g/dL (ref 13.0–17.0)
Potassium: 4.6 mEq/L (ref 3.5–5.1)

## 2011-11-03 MED FILL — Dextrose Inj 5%: INTRAVENOUS | Qty: 50 | Status: AC

## 2011-11-03 NOTE — Progress Notes (Signed)
Cardiology Progress Note Patient Name: Jeffrey [name] Date of Encounter: 11/03/2011, 11:44 AM     Subjective  No overnight events. Patient denies chest pain, sob, or palpitations. Reports having some dizziness upon standing and with ambulation.   He reports his health has been going "down hill" for the last 6mos and is unsure why. He states he has had worsening of his vision for which he had an appointment scheduled for tomorrow. Also reports dizziness and unsteadiness, "I have to hold on the wall when I urinate." He states he does not know how to cook and gets his food from fast food restaurants.    Objective   Telemetry: Sinus rhythm 60-70s  Medications: . aspirin EC  81 mg Oral Daily  . atorvastatin  80 mg Oral q1800  . carvedilol  6.25 mg Oral BID WC  . docusate sodium  200 mg Oral QHS  . heparin  5,000 Units Subcutaneous Q8H  . hydrALAZINE  10 mg Intravenous UD  . insulin aspart  0-15 Units Subcutaneous TID WC  . isosorbide mononitrate  30 mg Oral Daily  . linagliptin  5 mg Oral Daily  . loratadine  10 mg Oral Daily  . nitrofurantoin  100 mg Oral QHS  . pantoprazole  40 mg Oral Daily  . ramipril  5 mg Oral Daily  . Tamsulosin HCl  0.4 mg Oral QHS  . Ticagrelor  90 mg Oral BID   Physical Exam: Temp:  [97.9 F (36.6 C)-98.9 F (37.2 C)] 98 F (36.7 C) (04/22 0728) Pulse Rate:  [69-75] 70  (04/22 0312) Resp:  [13-19] 13  (04/22 0312) BP: (85-137)/(48-74) 137/66 mmHg (04/22 0728) SpO2:  [96 %-97 %] 97 % (04/22 0312) Weight:  [175 lb 4.3 oz (79.5 kg)-177 lb 4 oz (80.4 kg)] 175 lb 4.3 oz (79.5 kg) (04/22 1610)  General: Pleasant elderly white male, in no acute distress. Head: Normocephalic, atraumatic, sclera non-icteric, nares are without discharge.  Neck: Supple. Negative for carotid bruits or JVD Lungs: Clear bilaterally to auscultation without wheezes, rales, or rhonchi. Breathing is unlabored. Heart: RRR S1 S2 without murmurs, rubs, or gallops.  Abdomen:  Soft, non-tender, non-distended with normoactive bowel sounds. No rebound/guarding. No obvious abdominal masses. Msk:  Strength and tone appear normal for age. Extremities: No bleeding/bruit/hematoma of right groin cath site. No edema. No clubbing or cyanosis. Distal pedal pulses are intact and equal bilaterally. Neuro: Alert and oriented X 3. Moves all extremities spontaneously. Psych:  Responds to questions appropriately with a normal affect.   Intake/Output Summary (Last 24 hours) at 11/03/11 1144 Last data filed at 11/03/11 0500  Gross per 24 hour  Intake    540 ml  Output   1200 ml  Net   -660 ml    Labs:  [facility] 11/02/11 0642 11/01/11 1135  NA 137 132*  K 4.4 4.5  CL 102 99  CO2 25 20  GLUCOSE 176* 169*  BUN 10 13  CREATININE 0.87 0.80  CALCIUM 9.0 9.3   Basename 11/01/11 1018  AST 19  ALT 19  ALKPHOS 80  BILITOT 0.3  PROT 6.9  ALBUMIN 3.9   Basename 11/02/11 0642 11/01/11 1200 11/01/11 1018  WBC 7.1 6.7 --  NEUTROABS -- -- 6.4  HGB 12.7* 12.3* --  HCT 36.9* 35.6* --  MCV 89.6 89.0 --  PLT 186 188 --   Basename 11/01/11 2335 11/01/11 1951 11/01/11 1728 11/01/11 1135  CKTOTAL 149 143 153 177  CKMB 6.8* 7.0* 7.5* 7.9*  TROPONINI 0.73* 0.96* 1.05* 0.60*   Basename 11/01/11 1042  HGBA1C 7.6*   Basename 11/01/11 1042  CHOL 119  HDL 35*  LDLCALC 58  TRIG 161  CHOLHDL 3.4    Radiology/Studies:   11/01/11 - Cardiac Cath  1. Left main; 40% distal  2. LAD; 99% proximal followed by a long 75% segmental proximal.  3. Left circumflex; occluded in the midportion.  4. Right coronary artery; dominant and occluded in the midportion  5.LIMA TO LAD; patent  6. SVG TO RCA was occluded at the origin which is an old finding. SVG TO OM 2 and was patent with occluded continuation. There was a proximal stent in the vein graft there was noted to have a 99% in-stent restenosis. - **Stented w/ 2.75x44mm Resolute DES**  7. Left ventriculography; RAO left ventriculogram  was performed using 25 mL of Visipaque dye at 12 mL/second. The overall LVEF estimated 45 % With wall motion abnormalities notable for moderate inferobasal hypokinesia  11/01/2011 - CXR Findings: The patient has had median sternotomy CABG.  Heart is mildly enlarged.  No focal consolidations or pleural effusions.  No pulmonary edema.  Degenerative changes are seen in the thoracic spine.  IMPRESSION:  1.  Mild cardiomegaly. 2.  No edema or focal pulmonary abnormality.      Assessment and Plan  78 y.o. male w/ PMHx significant for CAD s/p CABG, HTN, HLD, DMII who presented to [facility] on 11/01/11 with near syncope and abnl EKG (minimal ST elevation in III and new q waves III, aVF).  1. NSTEMI/CAD: subtotally occluded proximal SVG to OM 2 "in-stent restenosis" s/p PCI/DES SVG-->OM2. CE (+) and trending down. No further chest pain. Right groin site w/o bleeding or bruit. Ambulated without chest pain or sob. Cont DAPT w/ 81mg  ASA & Ticagrelor, BB, statin, ACEI.   2. Recurrent Near Syncope: Initially felt neurocardiogenic in nature. No arrhythmias on telemetry. No further episodes of pre/syncope during hospitalization, however, did have some dizziness upon standing yesterday. Orthostatic vitals from yesterday showed BP 100/48 --> 85/53 and 120/69 --> 87/51 with c/o dizziness upon standing. BP meds include: 6.25mg  Coreg, 5mg  Altace, 30mg  Imdur.    3. HTN:  Orthostatic hypotension as above.  4. HLD: LDL 58, goal < 70. Statin increased during this admission. Will need f/u lipids/LFTs in 6-8wks  5. DMII: Metformin on hold due to cath. May resume tomorrow. Cont ACEI and other oral hyperglycemics. A1C 7.6. Need close f/u with PCP regarding improved management of blood glucose.   Disposition: Patient reports "going down hill' over the last six months. He lives alone and is unable to cook for himself. Reports change in vision, dizziness and feelings of unsteadiness at home. PT recommended SNF due to  severely limited functional mobility and if he refuses they recommend home health assistance. The patient was open to hearing his options for SNF, but prefers going home with assistance. Will have social work discuss options with him and dietician provide education regarding healthy meal options.  Signed, HOPE, JESSICA PA-C  Patient seen and examined.  Agree with findings of J hope.   Patient eneis CP.  Breathing OK  Just weak  Less dizzy than yesterday. Lungs:  CTA; Cardiac RRR.  No S3.  Extr  No edema  [facility] as noted.  BP is a litle better today.  Did drop yesterday. Keep head of bed elevated  Check orthostatics.  May need to cut back on  meds if does not improve.  Agree that he needs rehab  Will as social work to see.

## 2011-11-03 NOTE — Progress Notes (Signed)
PT/OT Cancellation Note  Treatment cancelled today due to pt. just worked with cardiac rehab and declines at this time. Will re-attempt as time allows.Marland Kitchen  Crista Luria Pager [phone] 11/03/2011, 12:57 PM

## 2011-11-03 NOTE — Progress Notes (Signed)
CARDIAC REHAB PHASE I   PRE:  Rate/Rhythm: 71 SR  BP:  Supine: 132/72  Sitting: 112/77  Standing: 100/54   SaO2: 98 RA  MODE:  Ambulation: 200 ft   POST:  Rate/Rhythem: 90 SR  BP:  Supine:   Sitting: 134/63  Standing:    SaO2: 99 RA 0825-0920  On arrival pt in bed without c/o. Assisted to side of bed and sat up c/o of some dizziness, then stood pt. He c/o of some dizziness with each change of position, BP drop with each position change,but not extremely low.Assisted X 1 and used walker to ambulate 200 feet. No c/o of cp or SOB. BP after walk 134/63. Pt to recliner after walk with call light in reach. Reviewed MI education with pt. He still continues with same issues that he has had with each admission. He lives alone and has limited assistance from his only daughter who is disable. He eats out at fast food places or from microwave. He has refused SNF which has been recommended last admission and again this admission by Physical Therapy.He certainly would benefit from any Home Health assistance if he continues to re fuss  SNF or Assisted Living.  [name]

## 2011-11-03 NOTE — Progress Notes (Signed)
Orthostatic VS Laying HR 83 BP 105/54  Sitting HR 88 BP 85/61 Standing HR 90 BP 78/47  Pt states "im weak while obtaining VS"

## 2011-11-03 NOTE — Progress Notes (Signed)
UR Completed. Simmons, Malene Blaydes F [phone]  

## 2011-11-03 NOTE — Clinical Documentation Improvement (Signed)
DIABETIC  DOCUMENTATION CLARIFICATION QUERY  THIS DOCUMENT IS NOT A PERMANENT PART OF THE MEDICAL RECORD  TO RESPOND TO THE THIS QUERY, FOLLOW THE INSTRUCTIONS BELOW:  1. If needed, update documentation for the patient's encounter via the notes activity.  2. Access this query again and click edit on the In [employer].  3. After updating, or not, click F2 to complete all highlighted (required) fields concerning your review. Select "additional documentation in the medical record" OR "no additional documentation provided".  4. Click Sign note button.  5. The deficiency will fall out of your In Basket *Please let us know if you are not able to complete this workflow by phone or e-mail (listed below).  Please update your documentation within the medical record to reflect your response to this query.                                                                                        11/03/11   Dear Dr.Bensimhon/Associates,  In a better effort to capture your patient's severity of illness, reflect appropriate length of stay and utilization of resources, a review of the patient medical record has revealed the following indicators.    Based on your clinical judgment, please clarify and document in a progress note and/or discharge summary the clinical condition associated with the following supporting information:  In responding to this query please exercise your independent judgment.  The fact that a query is asked, does not imply that any particular answer is desired or expected.  Please clarify and specify Diabetes type, control, manifestations, and associated conditions.  Possible Clinical Conditions?   _______Diabetes Type  1 or 2 _______Controlled or uncontrolled  _______Other Condition  _______Cannot Clinically determine    Supporting Information:  Risk Factors: DM noted per 11/02/11 progress notes. Please specify type and controlled vs uncontrolled in the progress notes and  discharge summary.  Diagnostics: Lab:  4/20 Hgb A1c= 7.6 Mean plasma glucose= 171.  You may use possible, probable, or suspect with inpatient documentation. possible, probable, suspected diagnoses MUST be documented at the time of discharge  Reviewed: Additional documentation provided per 11/04/11 progress notes.                               Thank You,  Marciano Sequin, Clinical Documentation Specialist:  Pager: [phone]  Health Information Management [place]

## 2011-11-03 NOTE — Progress Notes (Signed)
See OT note.    Jahmeir Geisen, PT [phone]  

## 2011-11-03 NOTE — Plan of Care (Signed)
Problem: Food- and Nutrition-Related Knowledge Deficit (NB-1.1) Goal: Nutrition education Formal process to instruct or train a patient/client in a skill or to impart knowledge to help patients/clients voluntarily manage or modify food choices and eating behavior to maintain or improve health.  Outcome: Completed/Met Date Met:  11/03/11 Reviewed heart healthy and diabetes diet guidelines with patient.  Handouts provided.  Discussed Plate Method meal planning with patient.  Patient did not seem very interested in learning about his diet, just wanted someone to talk to.  Eats lunch at church every day and receives a good meal, but breakfast and supper are just whatever he can find to eat (banana pudding from [employer], fast food, frozen meals, etc).  Patient feels he does not have the resources to eat healthy; has a limited budget and does not cook.  Discussed avoiding sugary beverages and eating more whole grains, fresh fruits and vegetables, less processed foods.  Unsure if patient understood the information we discussed, he kept changing the subject.  Consider OP diabetes/nutrition education for reinforcement.  No recent weight loss.  Eating well here in the hospital.  No other nutrition intervention needed at this time.  Hettie Holstein [phone]

## 2011-11-04 DIAGNOSIS — R42 Dizziness and giddiness: Secondary | ICD-10-CM

## 2011-11-04 DIAGNOSIS — I10 Essential (primary) hypertension: Secondary | ICD-10-CM

## 2011-11-04 LAB — GLUCOSE, CAPILLARY
Glucose-Capillary: 216 mg/dL — ABNORMAL HIGH (ref 70–99)
Glucose-Capillary: 181 mg/dL — ABNORMAL HIGH (ref 70–99)
Glucose-Capillary: 240 mg/dL — ABNORMAL HIGH (ref 70–99)

## 2011-11-04 NOTE — Progress Notes (Signed)
Transferred to 3712 via w/c with belongigns daugther aware of new room by pt. Jeffrey [name]

## 2011-11-04 NOTE — Progress Notes (Addendum)
Clinical Social Work Department CLINICAL SOCIAL WORK PLACEMENT NOTE 11/04/2011  Patient:  Jeffrey [name], Jeffrey [name]  Account Number:  [id number] Admit date:  11/01/2011  Clinical Social Worker:  Rozetta Nunnery, Theresia Majors  Date/time:  11/04/2011 12:25 PM  Clinical Social Work is seeking post-discharge placement for this patient at the following level of care:   SKILLED NURSING   (*CSW will update this form in Epic as items are completed)   11/04/2011  Patient/family provided with [facility] Department of Clinical Social Work's list of facilities offering this level of care within the geographic area requested by the patient (or if unable, by the patient's family).  11/04/2011  Patient/family informed of their freedom to choose among providers that offer the needed level of care, that participate in Medicare, Medicaid or managed care program needed by the patient, have an available bed and are willing to accept the patient.  11/04/2011  Patient/family informed of [facility]' ownership interest in [facility], as well as of the fact that they are under no obligation to receive care at this facility.  PASARR submitted to EDS on 11/04/2011 PASARR number received from EDS on 11/04/11  FL2 transmitted to all facilities in geographic area requested by pt/family on  11/04/2011 FL2 transmitted to all facilities within larger geographic area on   Patient informed that his/her managed care company has contracts with or will negotiate with  certain facilities, including the following:     Patient/family informed of bed offers received:  11/04/11 Patient chooses bed at [facility] Physician recommends and patient chooses bed at    Patient to be transferred to  on   Patient to be transferred to facility by   The following physician request were entered in Epic:   Additional Comments:

## 2011-11-04 NOTE — Progress Notes (Signed)
CARDIAC REHAB PHASE I   PRE:  Rate/Rhythm: 80SR  BP:  Supine: 110/60  Sitting: 90/60  Standing: 74/54   SaO2: 95%RA  MODE:  Ambulation:0  ft Dizzy standing. Did not walk.   POST:  Rate/Rhythem:   BP:  Supine:   Sitting:   Standing:    SaO2:  1328-1355 Pt willing to try to walk. Orthostatics done as documented. Pt became dizzy upon standing. BP 74/54. Had pt lay back down. Bed alarm on. Call bell in reach. Pt stated that when he walked earlier he did not feel dizzy. Instructed pt he must have asst to get OOB. Pt voiced understanding. Duanne Limerick

## 2011-11-04 NOTE — Progress Notes (Signed)
Physical Therapy Treatment Patient Details Name: RUHAAN NORDAHL MRN: [id number] DOB: Oct 06, 1928 Today's Date: 11/04/2011 Time: 7829-5621 PT Time Calculation (min): 25 min  PT Assessment / Plan / Recommendation Comments on Treatment Session  pt presents with NSTEMI and Orthostatic.  pt's BP decreases from Supine to standing, but pt asymptomatic.  pt impulsive and often repeats questions.  pt with decreased safety awareness and will need SNF at D/C.  Extensive discussion about need for SNF rehab as pt not seeming to understand what it is.      Follow Up Recommendations  Skilled nursing facility    Equipment Recommendations  Defer to next venue    Frequency Min 3X/week   Plan Discharge plan remains appropriate;Frequency remains appropriate    Precautions / Restrictions Precautions Precautions: Fall;Other (comment) Precaution Comments: Impulsive Restrictions Weight Bearing Restrictions: No   Pertinent Vitals/Pain Pt orthostatic, but asymptomatic.  See vitals section for BPs.      Mobility  Bed Mobility Bed Mobility: Rolling Right;Right Sidelying to Sit;Sitting - Scoot to Edge of Bed Rolling Right: 5: Supervision Right Sidelying to Sit: 5: Supervision;With rails Sitting - Scoot to Edge of Bed: 5: Supervision Transfers Transfers: Sit to Stand;Stand to Sit Sit to Stand: With upper extremity assist;From bed;4: Min assist Stand to Sit: 4: Min guard Details for Transfer Assistance: mod verbal cues for hand placement and technique Ambulation/Gait Ambulation/Gait Assistance: 4: Min assist Ambulation Distance (Feet): 50 Feet Assistive device: Rolling walker Ambulation/Gait Assistance Details: pt impulsive and needs cues and A for safety with RW and to negotiate obstacles.   Gait Pattern: Step-through pattern;Decreased stride length;Narrow base of support (Impulsive) Stairs: No Wheelchair Mobility Wheelchair Mobility: No    Exercises     PT Goals Acute Rehab PT Goals PT Goal:  Supine/Side to Sit - Progress: Progressing toward goal PT Goal: Sit to Stand - Progress: Progressing toward goal PT Goal: Stand to Sit - Progress: Progressing toward goal PT Goal: Ambulate - Progress: Progressing toward goal  Visit Information  Last PT Received On: 11/04/11 Assistance Needed: +2 PT/OT Co-Evaluation/Treatment: Yes    Subjective Data  Subjective: I didn't know what they were talking about last time I was here.     Cognition  Overall Cognitive Status: Impaired Area of Impairment: Safety/judgement Arousal/Alertness: Awake/alert Orientation Level: Oriented X4 / Intact Behavior During Session: WFL for tasks performed Safety/Judgement: Decreased safety judgement for tasks assessed Safety/Judgement - Other Comments: Pt lacks insight into balance deficits and how these impact function. Also, resistant to education for change in eating habits and safety with ADLs/ADL mobility.    Balance  Balance Balance Assessed: Yes Static Standing Balance Static Standing - Balance Support: Bilateral upper extremity supported Static Standing - Level of Assistance: 5: Stand by assistance Static Standing - Comment/# of Minutes: cues for attending to ask, use of UEs on RW.  pt easily distracted and needs cues to attend to safety.    End of Session PT - End of Session Equipment Utilized During Treatment: Gait belt Activity Tolerance: Patient tolerated treatment well Patient left: in chair;with call bell/phone within reach;with chair alarm set Nurse Communication: Mobility status    Sunny Schlein, Altha [phone] 11/04/2011, 1:06 PM

## 2011-11-04 NOTE — Progress Notes (Signed)
Inpatient Diabetes Program Recommendations  AACE/ADA: New Consensus Statement on Inpatient Glycemic Control (2009)  Target Ranges:  Prepandial:   less than 140 mg/dL      Peak postprandial:   less than 180 mg/dL (1-2 hours)      Critically ill patients:  140 - 180 mg/dL   Reason for Visit: Results for Jeffrey [name], Jeffrey [name] (MRN [id number]) as of 11/04/2011 11:50  Ref. Range 11/03/2011 12:41 11/03/2011 17:39 11/03/2011 21:49 11/04/2011 08:16 11/04/2011 11:29  Glucose-Capillary Latest Range: 70-99 mg/dL 914 (H) 782 (H) 956 (H) 209 (H) 216 (H)    Inpatient Diabetes Program Recommendations Insulin - Basal: If fasting CBG remains greater than 180 mg/dL, consider adding Lantus 10 units daily (while in the hospital). HgbA1C: Note A1C=7.6%.  Note: Will follow.

## 2011-11-04 NOTE — Evaluation (Signed)
Occupational Therapy Evaluation Patient Details Name: Jeffrey [name] MRN: [id number] DOB: [birth year] Today's Date: 11/04/2011 Time: 6295-2841 OT Time Calculation (min): 25 min  OT Assessment / Plan / Recommendation Clinical Impression  Pt. presents s/p NSTEMI/CAD and with balance deficits and overall deconditioning with prior admissions to the hospital. Pt. agreeable to SNF stay at D/C and will benefit from skilled OT to increase functional indepndence to supervision level before D/C to next venue of care.    OT Assessment  Patient needs continued OT Services    Follow Up Recommendations  Skilled nursing facility    Equipment Recommendations  Defer to next venue    Frequency Min 1X/week    Precautions / Restrictions Precautions Precautions: Fall;Other (comment) Restrictions Weight Bearing Restrictions: No   Pertinent Vitals/Pain See flowsheet for specific orthostatic BP's. Pt. Not symptomatic and RN aware.    ADL  Eating/Feeding: Simulated;Independent Where Assessed - Eating/Feeding: Edge of bed Grooming: Performed;Minimal assistance;Set up Where Assessed - Grooming: Standing at sink Upper Body Bathing: Simulated;Supervision/safety Where Assessed - Upper Body Bathing: Sitting, chair Lower Body Bathing: Simulated;Supervision/safety Where Assessed - Lower Body Bathing: Sitting, bed Upper Body Dressing: Performed;Set up Where Assessed - Upper Body Dressing: Sitting, bed Lower Body Dressing: Performed;Supervision/safety Where Assessed - Lower Body Dressing: Sitting, chair Toilet Transfer: Simulated;Minimal assistance Toilet Transfer Method: Ambulating Toilet Transfer Equipment: Other (comment) (recliner) Toileting - Clothing Manipulation: Simulated;Supervision/safety Where Assessed - Toileting Clothing Manipulation: Standing Toileting - Hygiene: Supervision/safety;Simulated Where Assessed - Toileting Hygiene: Standing Tub/Shower Transfer: Not assessed Equipment Used:  Rolling walker Ambulation Related to ADLs: Pt. min guard assist-min assist with mobility due to decreased balance and decreased safety awareness. Pt. impulsive with mobility and with intermittent LOB with use of RW ADL Comments: Pt. impulsive with mobility and also easily fatigued with activity. Pt. educated on energy conservation techniques with ADLs and need for ST-SNF.    OT Goals Acute Rehab OT Goals OT Goal Formulation: With patient Time For Goal Achievement: 11/18/11 Potential to Achieve Goals: Good ADL Goals Pt Will Perform Grooming: with set-up;with supervision;Standing at sink ADL Goal: Grooming - Progress: Goal set today Pt Will Perform Lower Body Bathing: with set-up;with supervision;Sit to stand from bed ADL Goal: Lower Body Bathing - Progress: Goal set today Pt Will Perform Lower Body Dressing: with set-up;with supervision;Sit to stand from bed ADL Goal: Lower Body Dressing - Progress: Goal set today Pt Will Transfer to Toilet: with set-up;with supervision;Ambulation;with DME;3-in-1 ADL Goal: Toilet Transfer - Progress: Goal set today Pt Will Perform Toileting - Hygiene: with set-up;Sit to stand from 3-in-1/toilet ADL Goal: Toileting - Hygiene - Progress: Goal set today Additional ADL Goal #1: Pt. will demonstrate safety with RW during functional mobility without verbal cues. ADL Goal: Additional Goal #1 - Progress: Goal set today  Visit Information  Last OT Received On: 11/04/11 Assistance Needed: +2 PT/OT Co-Evaluation/Treatment: Yes    Subjective Data  Subjective: "I will do whatever" Patient Stated Goal: I need to get moving   Prior Functioning  Home Living Lives With: Alone Available Help at Discharge: Friend(s) Type of Home: Mobile home Home Access: Stairs to enter Entrance Stairs-Number of Steps: 3 Entrance Stairs-Rails: Right Home Layout: One level Bathroom Shower/Tub: Engineer, manufacturing systems: Standard Bathroom Accessibility: No Home Adaptive  Equipment: Straight cane;Walker - rolling;Bedside commode/3-in-1 Additional Comments:  reports dizziness x 6 mos; has caused him to stop driving; couldn't use RW in his mobile home due to too narrow;  Prior Function Level of Independence: Independent Able to Take  Stairs?: Yes Driving: No Vocation: Retired Comments: Radio broadcast assistant, 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 Also, resistant to education for change in eating habits and safety with ADLs/ADL mobility.    Extremity/Trunk Assessment     Mobility Bed Mobility Bed Mobility: Rolling Right;Right Sidelying to Sit;Sitting - Scoot to Edge of Bed Rolling Right: 5: Supervision Right Sidelying to Sit: 5: Supervision;With rails Sitting - Scoot to Edge of Bed: 5: Supervision Transfers Transfers: Sit to Stand Sit to Stand: With upper extremity assist;From bed;4: Min assist Stand to Sit: 4: Min guard Details for Transfer Assistance: mod verbal cues for hand placement and technique   Exercise    Balance    End of Session OT - End of Session Equipment Utilized During Treatment: Gait belt Activity Tolerance: Patient tolerated treatment well Patient left: in chair;with call bell/phone within reach Nurse Communication: Mobility status   Cassandria Anger, OTR/L Pager [phone] 11/04/2011, 12:34 PM

## 2011-11-04 NOTE — Progress Notes (Signed)
Cardiology Progress Note Patient Name: Jeffrey [name] Date of Encounter: 11/04/2011, 7:46 AM     Subjective  Patient denies chest pain, sob, or palpitations.  Orthostatic dizziness is a chronic issue    Objective   Telemetry: Sinus rhythm 60-70s  Medications: . aspirin EC  81 mg Oral Daily  . atorvastatin  80 mg Oral q1800  . carvedilol  6.25 mg Oral BID WC  . docusate sodium  200 mg Oral QHS  . heparin  5,000 Units Subcutaneous Q8H  . hydrALAZINE  10 mg Intravenous UD  . insulin aspart  0-15 Units Subcutaneous TID WC  . isosorbide mononitrate  30 mg Oral Daily  . linagliptin  5 mg Oral Daily  . loratadine  10 mg Oral Daily  . nitrofurantoin  100 mg Oral QHS  . pantoprazole  40 mg Oral Daily  . ramipril  5 mg Oral Daily  . Tamsulosin HCl  0.4 mg Oral QHS  . Ticagrelor  90 mg Oral BID   Physical Exam: Temp:  [98.3 F (36.8 C)-99.1 F (37.3 C)] 98.4 F (36.9 C) (04/23 0310) Pulse Rate:  [67-79] 67  (04/23 0310) Resp:  [15-20] 20  (04/23 0310) BP: (113-130)/(43-71) 113/43 mmHg (04/23 0310) SpO2:  [96 %-97 %] 96 % (04/23 0310)  General: Pleasant elderly white male, in no acute distress. Head: Normocephalic, atraumatic, sclera non-icteric, nares are without discharge.  Neck: Supple. Negative for carotid bruits or JVD Lungs: Clear bilaterally to auscultation without wheezes, rales, or rhonchi. Breathing is unlabored. Heart: RRR S1 S2 without murmurs, rubs, or gallops.  Abdomen: Soft, non-tender, non-distended with normoactive bowel sounds. No rebound/guarding. No obvious abdominal masses. Msk:  Strength and tone appear normal for age. Extremities: No bleeding/bruit/hematoma of right groin cath site. No edema. No clubbing or cyanosis. Distal pedal pulses are intact and equal bilaterally. Neuro: Alert and oriented X 3. Moves all extremities spontaneously. Psych:  Responds to questions appropriately with a normal affect.   Intake/Output Summary (Last 24 hours) at  11/04/11 0746 Last data filed at 11/04/11 0400  Gross per 24 hour  Intake    120 ml  Output   1025 ml  Net   -905 ml    Labs:  [facility] 11/02/11 0642 11/01/11 1135  NA 137 132*  K 4.4 4.5  CL 102 99  CO2 25 20  GLUCOSE 176* 169*  BUN 10 13  CREATININE 0.87 0.80  CALCIUM 9.0 9.3   Basename 11/01/11 1018  AST 19  ALT 19  ALKPHOS 80  BILITOT 0.3  PROT 6.9  ALBUMIN 3.9   Basename 11/02/11 0642 11/01/11 1200 11/01/11 1018  WBC 7.1 6.7 --  NEUTROABS -- -- 6.4  HGB 12.7* 12.3* --  HCT 36.9* 35.6* --  MCV 89.6 89.0 --  PLT 186 188 --   Basename 11/01/11 2335 11/01/11 1951 11/01/11 1728 11/01/11 1135  CKTOTAL 149 143 153 177  CKMB 6.8* 7.0* 7.5* 7.9*  TROPONINI 0.73* 0.96* 1.05* 0.60*   Basename 11/01/11 1042  HGBA1C 7.6*   Basename 11/01/11 1042  CHOL 119  HDL 35*  LDLCALC 58  TRIG 161  CHOLHDL 3.4    Radiology/Studies:   11/01/11 - Cardiac Cath  1. Left main; 40% distal  2. LAD; 99% proximal followed by a long 75% segmental proximal.  3. Left circumflex; occluded in the midportion.  4. Right coronary artery; dominant and occluded in the midportion  5.LIMA TO LAD; patent  6. SVG TO  RCA was occluded at the origin which is an old finding. SVG TO OM 2 and was patent with occluded continuation. There was a proximal stent in the vein graft there was noted to have a 99% in-stent restenosis. - **Stented w/ 2.75x59mm Resolute DES**  7. Left ventriculography; RAO left ventriculogram was performed using 25 mL of Visipaque dye at 12 mL/second. The overall LVEF estimated 45 % With wall motion abnormalities notable for moderate inferobasal hypokinesia  11/01/2011 - CXR Findings: The patient has had median sternotomy CABG.  Heart is mildly enlarged.  No focal consolidations or pleural effusions.  No pulmonary edema.  Degenerative changes are seen in the thoracic spine.  IMPRESSION:  1.  Mild cardiomegaly. 2.  No edema or focal pulmonary abnormality.      Assessment and Plan   78 y.o. male w/ PMHx significant for CAD s/p CABG, HTN, HLD, DMII who presented to [facility] on 11/01/11 with NSTEMI, now s/p PCI.  1. NSTEMI/CAD: subtotally occluded proximal SVG to OM 2 "in-stent restenosis" s/p PCI/DES SVG-->OM2.   No further chest pain.   Cont DAPT w/ 81mg  ASA & Ticagrelor, BB, statin, ACEI.   2. Orthostatic dizziness:  Likely due to terazosin.  Will stop this medicine and monitor for urinary retension Continue antihypertensive medicines, avoid diuretics  3. HTN:  Orthostatic hypotension as above.  4. HLD: LDL 58, goal < 70. Statin increased during this admission. Will need f/u lipids/LFTs in 6-8wks  5. DMII:    A1C 7.6. Need close f/u with PCP regarding improved management of blood glucose.   Disposition: Orthostatic dizziness is chronic.  Hopefully will improve with stopping flomaz.  PT/ OT following. Options are home with HHPT vs SNF.  SW and case management consults were placed yesterday. Hope to discharge in next 24 hours.  Transfer to telemetry  Fayrene Fearing Lulamae Skorupski,MD

## 2011-11-04 NOTE — Progress Notes (Signed)
Clinical Social Worker updated patient with the bed offers received and patient chooses [facility]. CSW will continue to follow.   Rozetta Nunnery MSW, [employer] [phone]

## 2011-11-04 NOTE — Progress Notes (Addendum)
   CARE MANAGEMENT NOTE 11/04/2011  Patient:  Jeffrey [name], Jeffrey [name]   Account Number:  [id number]  Date Initiated:  11/04/2011  Documentation initiated by:  GRAVES-BIGELOW,Joselle Deeds  Subjective/Objective Assessment:   Pt admitted with Near syncope/abnormal ECG. Pt is from home alone and has support from daughter- Jeffrey [name] that lives in [place]. Pt states for the past couple of months he has gone down hill. CM stated that PT has recommended SNF.     Action/Plan:   Pt is agreeable to SNF at this time. Pt did state that he would like to either go to [facility] or [facility]. CM did relay information to CSW [place]. CM also placed a call to daughter and left vm. Awaiting call back.   Anticipated DC Date:  11/06/2011   Anticipated DC Plan:  SKILLED NURSING FACILITY  In-house referral  Clinical Social Worker      DC Planning Services  CM consult      Choice offered to / List presented to:             Status of service:  Completed, signed off Medicare Important Message given?   (If response is "NO", the following Medicare IM given date fields will be blank) Date Medicare IM given:   Date Additional Medicare IM given:    Discharge Disposition:  SKILLED NURSING FACILITY  Per UR Regulation:    If discussed at Long Length of Stay Meetings, dates discussed:    Comments:  11-04-11 1054 Tomi Bamberger, RN,BSN [phone] No other needs from CM at this time. Will continue to monitor.

## 2011-11-04 NOTE — Progress Notes (Signed)
Clinical Social Work Department BRIEF PSYCHOSOCIAL ASSESSMENT 11/04/2011  Patient:  Jeffrey [name], Jeffrey [name]     Account Number:  [id number]     Admit date:  11/01/2011  Clinical Social Worker:  Hulan Fray  Date/Time:  11/04/2011 11:30 AM  Referred by:  Care Management  Date Referred:  11/03/2011 Referred for  SNF Placement   Other Referral:   Interview type:  Patient Other interview type:    PSYCHOSOCIAL DATA Living Status:  ALONE Admitted from facility:   Level of care:   Primary support name:  Arlene Primary support relationship to patient:  CHILD, ADULT Degree of support available:   adequate    CURRENT CONCERNS Current Concerns  Post-Acute Placement   Other Concerns:    SOCIAL WORK ASSESSMENT / PLAN Clinical Social Worker received referral for SNF placement. CSW spoke with patient who lives alone in [place], [place]. Patient is widowed. 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 Patient stated that he has been having dizzy spells for about six months and he has noticed a change in his vision for about the same span of time of six months. Patient is agreeable to SNF placement and prefers [facility] but is willing to consider [facility] as these facilities are in [place], [place]. CSW will complete FL2 for MD's signature and intiate bed search. CSW will update patient and family when bed offers are received.   Assessment/plan status:  Psychosocial Support/Ongoing Assessment of Needs Other assessment/ plan:   Information/referral to community resources:   SNF packet, but patient stated that he did not have his glasses to look at it.    PATIENT'S/FAMILY'S RESPONSE TO PLAN OF CARE: Patient is agreeable to SNF placement and prefers [facility].     Rozetta Nunnery MSW, [employer] [phone]

## 2011-11-05 LAB — GLUCOSE, CAPILLARY: Glucose-Capillary: 214 mg/dL — ABNORMAL HIGH (ref 70–99)

## 2011-11-05 MED ORDER — ROSUVASTATIN CALCIUM 20 MG PO TABS: 20.0000 mg | ORAL_TABLET | Freq: Every day | ORAL | Status: DC

## 2011-11-05 MED ORDER — INSULIN ASPART 100 UNIT/ML ~~LOC~~ SOLN
0.0000 [IU] | Freq: Three times a day (TID) | SUBCUTANEOUS | Status: DC
  Administered 2011-11-05: 3 [IU] via SUBCUTANEOUS

## 2011-11-05 MED ORDER — ASPIRIN 81 MG PO TBEC: 81.0000 mg | DELAYED_RELEASE_TABLET | Freq: Every day | ORAL | Status: DC

## 2011-11-05 NOTE — Progress Notes (Signed)
Patient ID: Jeffrey [name], male   DOB: [birth year], 78 y.o.   MRN: [id number]   SUBJECTIVE:  The patient is stable. Social work has completed their evaluation and a site has been checked. I have signed the FL2 form. The patient feels well. He is laying flat in bed.   Filed Vitals:   11/04/11 1343 11/04/11 1744 11/04/11 2100 11/05/11 0500  BP: 123/62 121/67 115/69 144/76  Pulse: 74 72 73 67  Temp: 98.5 F (36.9 C)  98.3 F (36.8 C) 98.6 F (37 C)  TempSrc: Oral  Oral Oral  Resp: 15  18 18   Height:      Weight:      SpO2: 99%  96% 98%    Intake/Output Summary (Last 24 hours) at 11/05/11 0750 Last data filed at 11/05/11 0500  Gross per 24 hour  Intake    100 ml  Output    900 ml  Net   -800 ml    LABS: Basic Metabolic Panel: No results found for this basename: NA:2,K:2,CL:2,CO2:2,GLUCOSE:2,BUN:2,CREATININE:2,CALCIUM:2,MG:2,PHOS:2 in the last 72 hours Liver Function Tests: No results found for this basename: AST:2,ALT:2,ALKPHOS:2,BILITOT:2,PROT:2,ALBUMIN:2 in the last 72 hours No results found for this basename: LIPASE:2,AMYLASE:2 in the last 72 hours CBC: No results found for this basename: WBC:2,NEUTROABS:2,HGB:2,HCT:2,MCV:2,PLT:2 in the last 72 hours Cardiac Enzymes: No results found for this basename: CKTOTAL:3,CKMB:3,CKMBINDEX:3,TROPONINI:3 in the last 72 hours BNP: No components found with this basename: POCBNP:3 D-Dimer: No results found for this basename: DDIMER:2 in the last 72 hours Hemoglobin A1C: No results found for this basename: HGBA1C in the last 72 hours Fasting Lipid Panel: No results found for this basename: CHOL,HDL,LDLCALC,TRIG,CHOLHDL,LDLDIRECT in the last 72 hours Thyroid Function Tests: No results found for this basename: TSH,T4TOTAL,FREET3,T3FREE,THYROIDAB in the last 72 hours  RADIOLOGY: Dg Chest 2 View  10/24/2011  *RADIOLOGY REPORT*  Clinical Data: Cough, CHF.  CHEST - 2 VIEW  Comparison: None  Findings: There is hyperinflation of the lungs  compatible with COPD.  Prior CABG. Heart and mediastinal contours are within normal limits.  No focal opacities or effusions.  No acute bony abnormality.  IMPRESSION: COPD.  No active disease.  Original Report Authenticated By: Cyndie Chime, M.D.   Dg Chest Portable 1 View  11/01/2011  *RADIOLOGY REPORT*  Clinical Data: Code STEMI.  History diabetes, CAD, hypertension. History of smoking.  PORTABLE CHEST - 1 VIEW  Comparison: 10/20/2011  Findings: The patient has had median sternotomy CABG.  Heart is mildly enlarged.  No focal consolidations or pleural effusions.  No pulmonary edema.  Degenerative changes are seen in the thoracic spine.  IMPRESSION:  1.  Mild cardiomegaly. 2.  No edema or focal pulmonary abnormality.  Original Report Authenticated By: Patterson Hammersmith, M.D.   Chest Portable 1 View  10/20/2011  *RADIOLOGY REPORT*  Clinical Data: Chest pain, weakness  PORTABLE CHEST - 1 VIEW  Comparison: 07/30/2011  Findings: Coronary bypass changes evident.  Normal heart size and vascularity.  Minimal left lower lobe atelectasis / scarring. Negative for CHF, pneumonia, collapse, consolidation, effusion or pneumothorax.  Trachea midline.  Stable exam.  IMPRESSION: Stable portable chest exam.  No superimposed acute process  Original Report Authenticated By: Judie Petit. Ruel Favors, M.D.    PHYSICAL EXAM   Patient is stable in bed. Lungs are clear. Respiratory effort is nonlabored. Cardiac exam reveals S1 and S2. There are no significant murmurs.   TELEMETRY: I have personally reviewed telemetry. There is normal sinus rhythm.   ASSESSMENT AND PLAN:   *  NSTEMI, initial episode of care     Postural dizziness   This is a chronic problem and stable at this time.  Overall the patient is stable. He is ready for transfer to skilled nursing facility today. It is minor staining that these arrangements have been made. We will look into finalizing the discharge.   [name] 11/05/2011 7:50 AM

## 2011-11-05 NOTE — Progress Notes (Signed)
Clinical Social Work:  Patient medically stable for dc to SNF.  Patient will transfer to [facility] by EMS in which CSW arranged.  Called patient daughter to alert of dc.  Spoke with Eunice Blase at [facility] who reports she is ready for patient and will arranged EMS for 1:30pm.  No other needs FL2 signed, dc papers faxed and sent.  CSW signed off.  Ashley Jacobs, MSW LCSW [phone]  Coverage for [facility].

## 2011-11-05 NOTE — Progress Notes (Signed)
Pt received discharge instructions. Wrote down indications for each medication - pt says he does not know what most of his medications are for. No further questions. He is aware of follow up appointment. Report called to nurse at [facility] with no further questions. IV's DC'd. Pt stable for DC. Now waiting on ambulance for transportation. Duwaine Maxin, RN

## 2011-11-05 NOTE — Discharge Summary (Signed)
Discharge Summary   Patient ID: Jeffrey [name] MRN: [id number], DOB/AGE: 20-Jul-1928 78 y.o.  Primary MD: [name], MD Primary Cardiologist: Dr. Dietrich Pates Admit date: 11/01/2011 D/C date:     11/05/2011      Primary Discharge Diagnoses:  1. NSTEMI/CAD  - H/o remote CABG w/ multiple PCIs  - Cardiac cath this admission showed subtotally occluded proximal SVG to OM 2; s/p PCI and restenting of a circumflex obtuse marginal branch stent for in-stent restenosis with overlapping drug-eluting stents  2. Recurrent Near Syncope  - Initially felt neurocardiogenic, but was orthostatic hypotension with dizziness (this was noted to be chronic)  - Terazosin was stopped  - Evaluated by PT who recommended SNF due to his severely limited functional mobility and orthostatic dizziness  - Discharged to [facility] at [place]   3. Orthostatic Hypotension   - As above  4. Hyperlipidemia  - LDL 58, Statin increased during this admission. Will need f/u lipids/LFTs in 6-8wks   Secondary Discharge Diagnoses:  1. HTN 2. Diabetes Mellitus - A1C 7.6 3. CVA 4. Smokeless Tobacco abuse 5. PUD 6. GERD 7. Osteoarthritis  Allergies No Known Allergies  Diagnostic Studies/Procedures:   11/01/11 - Cardiac Cath ANGIOGRAPHIC RESULTS:  1. Left main; 40% distal  2. LAD; 99% proximal followed by a long 75% segmental proximal.  3. Left circumflex; occluded in the midportion.  4. Right coronary artery; dominant and occluded in the midportion  5.LIMA TO LAD; patent  6. SVG TO RCA was occluded at the origin which is an old finding  SVG TO OM 2 and was patent with occluded continuation. There was a proximal stent in the vein graft there was noted to have a 99% in-stent restenosis.  7. Left ventriculography; RAO left ventriculogram was performed using 25 mL of Visipaque dye at 12 mL/second. The overall LVEF estimated 45 % With wall motion abnormalities notable for moderate inferobasal hypokinesia    IMPRESSION: Mr. Towery has a subtotally occluded proximal SVG to OM 2 "in-stent restenosis". This is his "culprit vessel" to proceed with PCI and restenting using Intermedics, drug-eluting stent, and Brilinta.   History of Present Illness: 78 y.o. male w/ the above medical problems who presented to [facility]11 with recurrent near syncope and abnormal EKG (minimal ST elevation in III and new q waves III, aVF).  The patient was admitted twice this month with near syncope and weakness. The first time his troponin was 0.16 and the week prior to presentation he was admitted again with peak troponin of 2.7. His cath films were reviewed at that time and it was felt he should be managed medically given that he was pain free and had multiple areas for potential small vessel ischemia. He was switched from plavix to Brillinta as his P2Y12 activity was not fully suppressed. He reported having 5-10 minutes of mild CP and SOB the day before presentation while at lunch which resolved. On the day of presentation he again had near syncope while going to the bathroom and called EMS. Initial ECG in field showed mild ST elevation inferiorly so code STEMI was activated.   Hospital Course: In the ED, EKG revealed minimal ST elevation in III and new q waves III, aVF. He was pain free and did not meet STEMI criteria, however, his EKG was suggestive of ischemia and he was taken to the cath lab urgently for evaluation. Cardiac cath revealed subtotally occluded proximal SVG to OM 2, EF 45% with successful PCI and restenting  of the Cx OM branch with overlapping DES. He tolerated the procedure well without complications. He was continued on ASA, Brilinta, BB, ACEI, and statin. Troponins peaked at 1.05 then trended down. He had no further chest pain.   He ambulated with PT and cardiac rehab and was found to have orthostatic hypotension with dizziness. It was noted this is a chronic issue for him and likely  responsible for his presyncopal episode upon presentation. His Terazosin was discontinued. PT recommended SNF due to his severely limited functional mobility and orthostatic dizziness. This was discussed with the patient who agreed and selected to go to [facility] at [place]. He received dietary education from the dietician regarding healthy meal choices.  He was seen and evaluated by Dr. Myrtis Ser who felt he was stable for discharge to SNF with plans for follow up as scheduled below.  Discharge Vitals: Blood pressure 116/68, pulse 70, temperature 98.6 F (37 C), temperature source Oral, resp. rate 18, height 5\' 10"  (1.778 m), weight 175 lb 4.3 oz (79.5 kg), SpO2 98.00%.  Labs: Component Value Date   WBC 7.1 11/02/2011   HGB 12.7* 11/02/2011   HCT 36.9* 11/02/2011   MCV 89.6 11/02/2011   PLT 186 11/02/2011    Lab 11/02/11 0642 11/01/11 1018  NA 137 --  K 4.4 --  CL 102 --  CO2 25 --  BUN 10 --  CREATININE 0.87 --  CALCIUM 9.0 --  PROT -- 6.9  BILITOT -- 0.3  ALKPHOS -- 80  ALT -- 19  AST -- 19  GLUCOSE 176* --   Component Value Date   CHOL 119 11/01/2011   HDL 35* 11/01/2011   LDLCALC 58 11/01/2011   TRIG 131 11/01/2011     11/01/2011 10:15 11/01/2011 11:35 11/01/2011 17:28 11/01/2011 19:51 11/01/2011 23:35  CK, MB 8.6 (HH) 7.9 (HH) 7.5 (HH) 7.0 (HH) 6.8 (HH)  CK Total 193 177 153 143 149  Troponin I 0.44 (HH) 0.60 (HH) 1.05 (HH) 0.96 (HH) 0.73 (HH)     11/01/2011 10:42  Hemoglobin A1C 7.6 (H)    Discharge Medications   Medication List  As of 11/05/2011 10:47 AM   STOP taking these medications         simvastatin 20 MG tablet      Tamsulosin HCl 0.4 MG Caps         TAKE these medications         aspirin 81 MG EC tablet   Take 1 tablet (81 mg total) by mouth daily.      aspirin-sod bicarb-citric acid 325 MG Tbef   Commonly known as: ALKA-SELTZER   Take 325 mg by mouth daily as needed. For heartburn      calcium carbonate 500 MG chewable tablet   Commonly known as: TUMS -  dosed in mg elemental calcium   Chew 2 tablets by mouth daily as needed.      carvedilol 6.25 MG tablet   Commonly known as: COREG   Take 6.25 mg by mouth 2 (two) times daily with a meal.      COD LIVER OIL PO   Take 1 capsule by mouth daily.      isosorbide mononitrate 30 MG 24 hr tablet   Commonly known as: IMDUR   Take 1 tablet (30 mg total) by mouth daily.      loratadine 10 MG tablet   Commonly known as: CLARITIN   Take 10 mg by mouth daily.      meclizine 25 MG  tablet   Commonly known as: ANTIVERT   Take 25 mg by mouth daily as needed. For dizziness      metFORMIN 500 MG tablet   Commonly known as: GLUCOPHAGE   Take 500 mg by mouth 2 (two) times daily.      nitrofurantoin 100 MG capsule   Commonly known as: MACRODANTIN   Take 100 mg by mouth at bedtime.      nitroGLYCERIN 0.4 MG SL tablet   Commonly known as: NITROSTAT   Place 1 tablet (0.4 mg total) under the tongue every 5 (five) minutes x 3 doses as needed for chest pain.      ONGLYZA 5 MG Tabs tablet   Generic drug: saxagliptin HCl   Take 5 mg by mouth at bedtime.      pantoprazole 40 MG tablet   Commonly known as: PROTONIX   Take 40 mg by mouth daily.      PEPTO-BISMOL PO   Take by mouth daily as needed. Patient states that he just takes a couple of swallows when needed to control bowels.      ramipril 5 MG capsule   Commonly known as: ALTACE   Take 5 mg by mouth daily.      rosuvastatin 20 MG tablet   Commonly known as: CRESTOR   Take 1 tablet (20 mg total) by mouth at bedtime.      STOOL SOFTENER 100 MG capsule   Generic drug: Docusate Sodium   Take 200-300 mg by mouth at bedtime. Constipation      Ticagrelor 90 MG Tabs tablet   Commonly known as: BRILINTA   Take 1 tablet (90 mg total) by mouth 2 (two) times daily.      traMADol 50 MG tablet   Commonly known as: ULTRAM   Take 1 tablet (50 mg total) by mouth every 8 (eight) hours as needed for pain.            Disposition   Discharge  Orders    Future Appointments: Provider: Department: Dept Phone: Center:   11/18/2011 11:20 AM Jodelle Gross, NP [facility] [phone] [id number]   12/04/2011 11:20 AM Jodelle Gross, NP [facility] [phone] [id number]     Future Orders Please Complete By Expires   Diet - low sodium heart healthy      Increase activity slowly      Discharge instructions      Comments:   *PLEASE REMEMBER TO BRING ALL OF YOUR MEDICATIONS TO EACH OF YOUR FOLLOW-UP OFFICE VISITS.  *KEEP GROIN SITE CLEAN AND DRY. Call the office for any signs of bleedings, pus, swelling, increased pain, or any other concerns. * NO HEAVY LIFTING (>10lbs) X 2 WEEKS. * NO SEXUAL ACTIVITY X 2 WEEKS. * NO DRIVING X 3 DAYS. * NO SOAKING BATHS, HOT TUBS, POOLS, ETC., X 3 DAYS.      Follow-up Information    Follow up with Russell Bing, MD on 11/18/2011. (11:20)    Contact information:   [facility] [address] [place] [address] [phone]           Outstanding Labs/Studies:  1. Statin increased during this admission. Will need f/u lipids/LFTs in 6-8wks  Duration of Discharge Encounter: Greater than 30 minutes including physician and PA time.  Signed, HOPE, JESSICA PA-C 11/05/2011, 10:47 AM  Patient seen and examined. I agree with the assessment and plan as detailed above. See also my additional thoughts below.  See my progress note also.  Tinnie Gens  Myrtis Ser, MD, [facility] 11/06/2011 7:33 AM

## 2011-11-05 NOTE — Progress Notes (Signed)
CARDIAC REHAB PHASE I   PRE:  Rate/Rhythm: 69 SR  BP:  Supine: 140/70  Sitting: 112/62  Standing: 86/60   SaO2: 97 RA  MODE:  Ambulation: 160 ft   POST:  Rate/Rhythem: 66  BP:  Supine:   Sitting: 132/70  Standing:    SaO2: 98 RA 1045-1125 On arrival pt in bed without c/o. States that he has been out of bed with assistance to bathroom and denied any dizziness. Took orthostatic BPs. Pt denies dizziness with position changes, but states that he can tell things change.  On standing states he feels he can walk. Walked 160 feet, BP after walk and in recliner 132/70. Pt in recliner after walk with call light in reach and told not to get up without assistance.   [name]

## 2011-11-18 ENCOUNTER — Encounter: Payer: Self-pay | Admitting: Adult Health

## 2011-12-04 ENCOUNTER — Encounter: Payer: Self-pay | Admitting: Adult Health

## 2011-12-04 ENCOUNTER — Ambulatory Visit (INDEPENDENT_AMBULATORY_CARE_PROVIDER_SITE_OTHER): Payer: Medicare Other | Admitting: Adult Health

## 2011-12-04 VITALS — BP 103/62 | HR 79 | Resp 18 | Ht 70.0 in | Wt 175.0 lb

## 2011-12-04 DIAGNOSIS — I200 Unstable angina: Secondary | ICD-10-CM

## 2011-12-04 DIAGNOSIS — I10 Essential (primary) hypertension: Secondary | ICD-10-CM

## 2011-12-04 DIAGNOSIS — I249 Acute ischemic heart disease, unspecified: Secondary | ICD-10-CM

## 2011-12-04 DIAGNOSIS — E7800 Pure hypercholesterolemia, unspecified: Secondary | ICD-10-CM

## 2011-12-04 DIAGNOSIS — I2510 Atherosclerotic heart disease of native coronary artery without angina pectoris: Secondary | ICD-10-CM

## 2011-12-04 LAB — CBC WITH DIFFERENTIAL/PLATELET
Basophils Relative: 0 % (ref 0–1)
Eosinophils Absolute: 0.4 10*3/uL (ref 0.0–0.7)
HCT: 37.1 % — ABNORMAL LOW (ref 39.0–52.0)
Hemoglobin: 12.7 g/dL — ABNORMAL LOW (ref 13.0–17.0)
MCH: 29.5 pg (ref 26.0–34.0)
MCHC: 34.2 g/dL (ref 30.0–36.0)
Monocytes Absolute: 0.8 10*3/uL (ref 0.1–1.0)
Monocytes Relative: 9 % (ref 3–12)
Neutrophils Relative %: 64 % (ref 43–77)

## 2011-12-04 NOTE — Assessment & Plan Note (Signed)
He is s/p PCI with overlapping stents to the Cx OM. He is doing well and tolerating his medication regimen. I have explained to him the importance of the DAPT, and gone over his medications with him. We are also placing drug indication beside each medication listed on his AVS. He will have a CBC and CMET completed to evaluate his fatigue. He does not have complaints of bleeding but will check for anemia on DAPT.  Will see him in 3 months.

## 2011-12-04 NOTE — Assessment & Plan Note (Signed)
Currently well controlled, low normal. He denies any dizziness associated with position change. Blood pressure on discharge was 116/68. Will follow this. EF was 45%. Labs are ordered.

## 2011-12-04 NOTE — Progress Notes (Signed)
HPI:Jeffrey [name] is 78 y/o patient of Dr. Dietrich Pates we are seeing on hospital follow-up after admission for NSTEMI requiring cardiac catheterization.  The results demonstarated subtotally occluded proximal SVG to OM2. He required PCI and restenting of CX OM with overlapping DES. He was placed on DAPT with Brilinta and ASA. He presented initially with near syncope and chest discomfort.   Since discharge on 11/03/2011 he had been admitted to [facility] for PT rehab in the setting of deconditioning. He states he benefited from this but is still feeling overall fatigue. He denies recurrent chest pain or dizziness. He lives alone and remains active within his age and physical stamina. He is medically complaint and is actually curious about each medication he is taking and reasons behind the need for them.   No Known Allergies  Current Outpatient Prescriptions  Medication Sig Dispense Refill  . aspirin EC 81 MG EC tablet Take 1 tablet (81 mg total) by mouth daily.      Marland Kitchen aspirin-sod bicarb-citric acid (ALKA-SELTZER) 325 MG TBEF Take 325 mg by mouth daily as needed. For heartburn      . Bismuth Subsalicylate (PEPTO-BISMOL PO) Take by mouth daily as needed. Patient states that he just takes a couple of swallows when needed to control bowels.       . calcium carbonate (TUMS - DOSED IN MG ELEMENTAL CALCIUM) 500 MG chewable tablet Chew 2 tablets by mouth daily as needed.      . carvedilol (COREG) 6.25 MG tablet Take 6.25 mg by mouth 2 (two) times daily with a meal.      . COD LIVER OIL PO Take 1 capsule by mouth daily.        Tery Sanfilippo Sodium (STOOL SOFTENER) 100 MG capsule Take 200-300 mg by mouth at bedtime. Constipation      . isosorbide mononitrate (IMDUR) 30 MG 24 hr tablet Take 1 tablet (30 mg total) by mouth daily.  30 tablet  6  . loratadine (CLARITIN) 10 MG tablet Take 10 mg by mouth daily.        . meclizine (ANTIVERT) 25 MG tablet Take 25 mg by mouth daily as needed. For dizziness      . metFORMIN  (GLUCOPHAGE) 500 MG tablet Take 500 mg by mouth 2 (two) times daily.       . nitroGLYCERIN (NITROSTAT) 0.4 MG SL tablet Place 1 tablet (0.4 mg total) under the tongue every 5 (five) minutes x 3 doses as needed for chest pain.  10 tablet  0  . pantoprazole (PROTONIX) 40 MG tablet Take 40 mg by mouth daily.      . ramipril (ALTACE) 5 MG capsule Take 5 mg by mouth daily.        . rosuvastatin (CRESTOR) 20 MG tablet Take 1 tablet (20 mg total) by mouth at bedtime.  30 tablet  2  . saxagliptin HCl (ONGLYZA) 5 MG TABS tablet Take 5 mg by mouth at bedtime.       . Ticagrelor (BRILINTA) 90 MG TABS tablet Take 1 tablet (90 mg total) by mouth 2 (two) times daily.  60 tablet  0  . traMADol (ULTRAM) 50 MG tablet Take 50 mg by mouth every 6 (six) hours as needed.        Past Medical History  Diagnosis Date  . Diabetes mellitus   . Hypercholesterolemia   . Coronary artery disease     a.  s/p CABG x 3 1989, VG->LCX, VG->RCA, LIMA->LAD;  b. 05/24/10 Cath/PCI:  3vd, LIMA patent, VG->RCA occluded, VG->LCX 90 in graft (3.0x15 promus) & 90 in distal LCX (2.75x15 Promus);  c. NSTEMI in 04/2011 - Cath occlusion of distal LCX stent.   . Coronary artery disease     a.  s/p CABG 1999;  b. s/p stenting to distal LCx, s/p stent to proximal SVG-LCx in 05/2010;  c. NSTEMI in 04/2011 with cath showing occluded OM2 graft   . Diabetes mellitus   . Hypertension   . PUD (peptic ulcer disease)   . Hypercholesteremia   . Myocardial infarct, old   . GERD (gastroesophageal reflux disease)   . Arthritis   . Osteoarthritis   . Stroke   . Pneumonia     Past Surgical History  Procedure Date  . Coronary angioplasty with stent placement   . Tonsillectomy   . Post aortocoronary bypass surgery   . Coronary artery bypass graft   . Coronary stent placement   . Coronary angioplasty     ROS Review of systems complete and found to be negative unless listed above PHYSICAL EXAM BP 103/62  Pulse 79  Resp 18  Ht 5\' 10"  (1.778  m)  Wt 175 lb (79.379 kg)  BMI 25.11 kg/m2  General: Well developed, well nourished, in no acute distress Head: Eyes PERRLA, No xanthomas.   Normal cephalic and atraumatic Lungs: Clear bilaterally to auscultation and percussion.Mild basilar crackles are noted.  Heart: HRRR S1 S2, distant with mild systolic mumur.  Pulses are 2+ & equal.            No carotid bruit. No JVD.  No abdominal bruits. No femoral bruits. Abdomen: Bowel sounds are positive, abdomen soft and non-tender without masses or                  Hernia's noted. Msk:  Back normal, normal gait. Normal strength and tone for age. Extremities: No clubbing, cyanosis or edema.  DP +1 Right groin site of catheter insertion is well healed without signs of bleeding or bruising.  Neuro: Alert and oriented X 3. Hard of Hearing. Psych:  Good affect, responds appropriately    ASSESSMENT AND PLAN

## 2011-12-04 NOTE — Patient Instructions (Addendum)
**Note De-identified  Obfuscation** Your physician recommends that you continue on your current medications as directed. Please refer to the Current Medication list given to you today.  Your physician recommends that you return for lab work in: today  Your physician recommends that you schedule a follow-up appointment in: 3 months   

## 2011-12-05 LAB — COMPREHENSIVE METABOLIC PANEL
ALT: 17 U/L (ref 0–53)
AST: 22 U/L (ref 0–37)
Albumin: 4 g/dL (ref 3.5–5.2)
Alkaline Phosphatase: 72 U/L (ref 39–117)
BUN: 12 mg/dL (ref 6–23)

## 2011-12-15 ENCOUNTER — Inpatient Hospital Stay (HOSPITAL_COMMUNITY)
Admission: EM | Admit: 2011-12-15 | Discharge: 2011-12-17 | DRG: 313 | Disposition: A | Discharge status: Home / Self Care | Payer: Medicare Other | Attending: Internal Medicine | Admitting: Internal Medicine

## 2011-12-15 ENCOUNTER — Encounter (HOSPITAL_COMMUNITY): Payer: Self-pay | Admitting: *Deleted

## 2011-12-15 ENCOUNTER — Other Ambulatory Visit: Payer: Self-pay

## 2011-12-15 ENCOUNTER — Emergency Department (HOSPITAL_COMMUNITY): Payer: Medicare Other

## 2011-12-15 DIAGNOSIS — Z8673 Personal history of transient ischemic attack (TIA), and cerebral infarction without residual deficits: Secondary | ICD-10-CM

## 2011-12-15 DIAGNOSIS — Z7982 Long term (current) use of aspirin: Secondary | ICD-10-CM

## 2011-12-15 DIAGNOSIS — I10 Essential (primary) hypertension: Secondary | ICD-10-CM

## 2011-12-15 DIAGNOSIS — E1165 Type 2 diabetes mellitus with hyperglycemia: Secondary | ICD-10-CM

## 2011-12-15 DIAGNOSIS — E7800 Pure hypercholesterolemia, unspecified: Secondary | ICD-10-CM | POA: Diagnosis present

## 2011-12-15 DIAGNOSIS — R42 Dizziness and giddiness: Secondary | ICD-10-CM

## 2011-12-15 DIAGNOSIS — K219 Gastro-esophageal reflux disease without esophagitis: Secondary | ICD-10-CM

## 2011-12-15 DIAGNOSIS — Z79899 Other long term (current) drug therapy: Secondary | ICD-10-CM

## 2011-12-15 DIAGNOSIS — Z87891 Personal history of nicotine dependence: Secondary | ICD-10-CM

## 2011-12-15 DIAGNOSIS — I252 Old myocardial infarction: Secondary | ICD-10-CM

## 2011-12-15 DIAGNOSIS — I249 Acute ischemic heart disease, unspecified: Secondary | ICD-10-CM

## 2011-12-15 DIAGNOSIS — I200 Unstable angina: Secondary | ICD-10-CM

## 2011-12-15 DIAGNOSIS — F0390 Unspecified dementia without behavioral disturbance: Secondary | ICD-10-CM

## 2011-12-15 DIAGNOSIS — I2510 Atherosclerotic heart disease of native coronary artery without angina pectoris: Secondary | ICD-10-CM

## 2011-12-15 DIAGNOSIS — Z951 Presence of aortocoronary bypass graft: Secondary | ICD-10-CM

## 2011-12-15 DIAGNOSIS — Z8701 Personal history of pneumonia (recurrent): Secondary | ICD-10-CM

## 2011-12-15 DIAGNOSIS — E119 Type 2 diabetes mellitus without complications: Secondary | ICD-10-CM

## 2011-12-15 DIAGNOSIS — Z9861 Coronary angioplasty status: Secondary | ICD-10-CM

## 2011-12-15 DIAGNOSIS — E785 Hyperlipidemia, unspecified: Secondary | ICD-10-CM

## 2011-12-15 DIAGNOSIS — R079 Chest pain, unspecified: Secondary | ICD-10-CM

## 2011-12-15 DIAGNOSIS — E782 Mixed hyperlipidemia: Secondary | ICD-10-CM

## 2011-12-15 DIAGNOSIS — I214 Non-ST elevation (NSTEMI) myocardial infarction: Secondary | ICD-10-CM

## 2011-12-15 DIAGNOSIS — R748 Abnormal levels of other serum enzymes: Secondary | ICD-10-CM

## 2011-12-15 DIAGNOSIS — M1990 Unspecified osteoarthritis, unspecified site: Secondary | ICD-10-CM | POA: Diagnosis present

## 2011-12-15 DIAGNOSIS — R55 Syncope and collapse: Secondary | ICD-10-CM

## 2011-12-15 DIAGNOSIS — N400 Enlarged prostate without lower urinary tract symptoms: Secondary | ICD-10-CM

## 2011-12-15 DIAGNOSIS — IMO0002 Reserved for concepts with insufficient information to code with codable children: Secondary | ICD-10-CM

## 2011-12-15 LAB — CBC
MCH: 30.1 pg (ref 26.0–34.0)
MCV: 90.6 fL (ref 78.0–100.0)
Platelets: 192 10*3/uL (ref 150–400)
RDW: 13.5 % (ref 11.5–15.5)

## 2011-12-15 LAB — CARDIAC PANEL(CRET KIN+CKTOT+MB+TROPI)
CK, MB: 10.8 ng/mL (ref 0.3–4.0)
Total CK: 520 U/L — ABNORMAL HIGH (ref 7–232)
Total CK: 437 U/L — ABNORMAL HIGH (ref 7–232)
Troponin I: <0.3 ng/mL (ref <0.30)

## 2011-12-15 LAB — BASIC METABOLIC PANEL
Calcium: 9.7 mg/dL (ref 8.4–10.5)
Creatinine, Ser: 0.9 mg/dL (ref 0.50–1.35)
GFR calc Af Amer: >90 mL/min (ref >90)

## 2011-12-15 MED ORDER — ASPIRIN 81 MG PO CHEW
324.0000 mg | CHEWABLE_TABLET | Freq: Once | ORAL | Status: AC
  Administered 2011-12-15: 324 mg via ORAL
  Filled 2011-12-15: qty 4

## 2011-12-15 MED ORDER — LINAGLIPTIN 5 MG PO TABS
5.0000 mg | ORAL_TABLET | Freq: Every day | ORAL | Status: DC
  Administered 2011-12-15 – 2011-12-17 (×3): 5 mg via ORAL
  Filled 2011-12-15 (×5): qty 1

## 2011-12-15 MED ORDER — CALCIUM CARBONATE ANTACID 500 MG PO CHEW: 2.0000 | CHEWABLE_TABLET | Freq: Every day | ORAL | Status: DC | PRN

## 2011-12-15 MED ORDER — DOCUSATE SODIUM 100 MG PO CAPS
200.0000 mg | ORAL_CAPSULE | Freq: Every day | ORAL | Status: DC
  Administered 2011-12-16 (×2): 200 mg via ORAL
  Filled 2011-12-15: qty 1
  Filled 2011-12-15: qty 2

## 2011-12-15 MED ORDER — ONDANSETRON HCL 4 MG/2ML IJ SOLN
4.0000 mg | Freq: Once | INTRAMUSCULAR | Status: AC
  Administered 2011-12-15: 4 mg via INTRAVENOUS
  Filled 2011-12-15: qty 2

## 2011-12-15 MED ORDER — SODIUM CHLORIDE 0.9 % IV SOLN
INTRAVENOUS | Status: DC
  Administered 2011-12-15: 20 mL via INTRAVENOUS

## 2011-12-15 MED ORDER — TRAMADOL HCL 50 MG PO TABS: 50.0000 mg | ORAL_TABLET | Freq: Four times a day (QID) | ORAL | Status: DC | PRN

## 2011-12-15 MED ORDER — ACETAMINOPHEN 650 MG RE SUPP: 650.0000 mg | Freq: Four times a day (QID) | RECTAL | Status: DC | PRN

## 2011-12-15 MED ORDER — TICAGRELOR 90 MG PO TABS
90.0000 mg | ORAL_TABLET | Freq: Two times a day (BID) | ORAL | Status: DC
  Administered 2011-12-16 – 2011-12-17 (×3): 90 mg via ORAL
  Filled 2011-12-15 (×8): qty 1

## 2011-12-15 MED ORDER — ONDANSETRON HCL 4 MG PO TABS: 4.0000 mg | ORAL_TABLET | Freq: Four times a day (QID) | ORAL | Status: DC | PRN

## 2011-12-15 MED ORDER — ATORVASTATIN CALCIUM 40 MG PO TABS
40.0000 mg | ORAL_TABLET | Freq: Every day | ORAL | Status: DC
  Administered 2011-12-16 (×2): 40 mg via ORAL
  Filled 2011-12-15 (×2): qty 1

## 2011-12-15 MED ORDER — SODIUM CHLORIDE 0.9 % IV SOLN
INTRAVENOUS | Status: AC
  Administered 2011-12-16: 10:00:00 via INTRAVENOUS

## 2011-12-15 MED ORDER — CARVEDILOL 3.125 MG PO TABS
6.2500 mg | ORAL_TABLET | Freq: Two times a day (BID) | ORAL | Status: DC
  Administered 2011-12-16 (×2): 6.25 mg via ORAL
  Filled 2011-12-15 (×2): qty 2

## 2011-12-15 MED ORDER — MORPHINE SULFATE 2 MG/ML IJ SOLN: 2.0000 mg | INTRAMUSCULAR | Status: DC | PRN

## 2011-12-15 MED ORDER — PANTOPRAZOLE SODIUM 40 MG PO TBEC
40.0000 mg | DELAYED_RELEASE_TABLET | Freq: Every day | ORAL | Status: DC
  Administered 2011-12-16 (×2): 40 mg via ORAL
  Filled 2011-12-15 (×2): qty 1

## 2011-12-15 MED ORDER — SODIUM CHLORIDE 0.9 % IJ SOLN
3.0000 mL | Freq: Two times a day (BID) | INTRAMUSCULAR | Status: DC
  Administered 2011-12-16 – 2011-12-17 (×3): 3 mL via INTRAVENOUS
  Filled 2011-12-15 (×3): qty 3

## 2011-12-15 MED ORDER — ONDANSETRON HCL 4 MG/2ML IJ SOLN: 4.0000 mg | Freq: Four times a day (QID) | INTRAMUSCULAR | Status: DC | PRN

## 2011-12-15 MED ORDER — NITROGLYCERIN 0.4 MG SL SUBL: 0.4000 mg | SUBLINGUAL_TABLET | SUBLINGUAL | Status: DC | PRN

## 2011-12-15 MED ORDER — RAMIPRIL 2.5 MG PO CAPS
5.0000 mg | ORAL_CAPSULE | Freq: Every day | ORAL | Status: DC
  Administered 2011-12-16 (×2): 5 mg via ORAL
  Filled 2011-12-15: qty 2
  Filled 2011-12-15: qty 1

## 2011-12-15 MED ORDER — ASPIRIN 325 MG PO TABS
325.0000 mg | ORAL_TABLET | Freq: Every day | ORAL | Status: DC
  Administered 2011-12-16 – 2011-12-17 (×2): 325 mg via ORAL
  Filled 2011-12-15 (×2): qty 1

## 2011-12-15 MED ORDER — ACETAMINOPHEN 325 MG PO TABS: 650.0000 mg | ORAL_TABLET | Freq: Four times a day (QID) | ORAL | Status: DC | PRN

## 2011-12-15 MED ORDER — ISOSORBIDE MONONITRATE ER 60 MG PO TB24
30.0000 mg | ORAL_TABLET | Freq: Every day | ORAL | Status: DC
  Administered 2011-12-16: 30 mg via ORAL
  Filled 2011-12-15: qty 1

## 2011-12-15 MED ORDER — METFORMIN HCL 500 MG PO TABS
500.0000 mg | ORAL_TABLET | Freq: Two times a day (BID) | ORAL | Status: DC
  Administered 2011-12-16 – 2011-12-17 (×3): 500 mg via ORAL
  Filled 2011-12-15 (×3): qty 1

## 2011-12-15 MED ORDER — ONDANSETRON HCL 4 MG/2ML IJ SOLN: 4.0000 mg | Freq: Three times a day (TID) | INTRAMUSCULAR | Status: AC | PRN

## 2011-12-15 NOTE — ED Notes (Signed)
Pt eating supper.  

## 2011-12-15 NOTE — H&P (Signed)
Hospital Admission Note Date: 12/15/2011  Patient name: Jeffrey [name] Medical record number: [id number] Date of birth: 05-Jun-1929 Age: 78 y.o. Gender: male PCP: [name], MD, MD  Chief Complaint: Indigestion  History of Present Illness:  Jeffrey [name] is an 78 y.o. male who presents with chest discomfort/indigestion. It felt like previous episodes of discomfort which occurred with previous MIs. He ingested TUMs with some relief. However the discomfort returned and he felt fatigued and nauseated diaphoretic and had to sit down. He had a non-ST segment elevation MI in April and had stents placed. Patient is currently pain free. He feels a little nauseated and weak however. The ED physician spoke with Dr. Eden Emms who recommended admission and rule out at [facility]. Patient's troponin is normal. EKG shows no new ischemic changes. Patient has stable vital signs.  Past Medical History  Diagnosis Date  . Diabetes mellitus   . Hypercholesterolemia   . Coronary artery disease     a.  s/p CABG x 3 1989, VG->LCX, VG->RCA, LIMA->LAD;  b. 05/24/10 Cath/PCI: 3vd, LIMA patent, VG->RCA occluded, VG->LCX 90 in graft (3.0x15 promus) & 90 in distal LCX (2.75x15 Promus);  c. NSTEMI in 04/2011 - Cath occlusion of distal LCX stent.   . Coronary artery disease     a.  s/p CABG 1999;  b. s/p stenting to distal LCx, s/p stent to proximal SVG-LCx in 05/2010;  c. NSTEMI in 04/2011 with cath showing occluded OM2 graft   . Diabetes mellitus   . Hypertension   . PUD (peptic ulcer disease)   . Hypercholesteremia   . Myocardial infarct, old   . GERD (gastroesophageal reflux disease)   . Arthritis   . Osteoarthritis   . Stroke   . Pneumonia    Meds: See med rec  Allergies: Review of patient's allergies indicates no known allergies. History   Social History  . Marital Status: Divorced    Spouse Name: N/A    Number of Children: N/A  . Years of Education: N/A   Occupational History  .  retired     Visual merchandiser   Social History Main Topics  . Smoking status: Former Smoker    Types: Cigarettes  . Smokeless tobacco: Current User    Types: Chew  . Alcohol Use: No  . Drug Use: No  . Sexually Active: No   Other Topics Concern  . Not on file   Social History Narrative   ** Merged History Encounter **    History reviewed. No pertinent family history. Past Surgical History  Procedure Date  . Coronary angioplasty with stent placement   . Tonsillectomy   . Post aortocoronary bypass surgery   . Coronary artery bypass graft   . Coronary stent placement   . Coronary angioplasty     Review of Systems: Systems reviewed and as per HPI, otherwise negative.  Physical Exam: Blood pressure 133/66, pulse 75, temperature 97.9 F (36.6 C), temperature source Oral, resp. rate 17, height 5\' 10"  (1.778 m), weight 81.647 kg (180 lb), SpO2 100.00%. BP 133/66  Pulse 75  Temp(Src) 97.9 F (36.6 C) (Oral)  Resp 17  Ht 5\' 10"  (1.778 m)  Wt 81.647 kg (180 lb)  BMI 25.83 kg/m2  SpO2 100%  General Appearance:    Alert, cooperative, no distress, appears stated age  Head:    Normocephalic, without obvious abnormality, atraumatic  Eyes:    PERRL, conjunctiva/corneas clear, EOM's intact, fundi    benign, both eyes  Ears:    Normal TM's and external ear canals, both ears  Nose:   Nares normal, septum midline, mucosa normal, no drainage    or sinus tenderness  Throat:   Lips, mucosa, and tongue normal; teeth and gums normal  Neck:   Supple, symmetrical, trachea midline, no adenopathy;       thyroid:  No enlargement/tenderness/nodules; no carotid   bruit or JVD  Back:     Symmetric, no curvature, ROM normal, no CVA tenderness  Lungs:     Clear to auscultation bilaterally, respirations unlabored  Chest wall:    No tenderness or deformity  Heart:    Regular rate and rhythm, S1 and S2 normal, no murmur, rub   or gallop  Abdomen:     Soft, non-tender, bowel sounds active all four  quadrants,    no masses, no organomegaly  Genitalia:   deferred   Rectal:   deferred   Extremities:   Extremities normal, atraumatic, no cyanosis or edema  Pulses:   2+ and symmetric all extremities  Skin:   Skin color, texture, turgor normal, no rashes or lesions  Lymph nodes:   Cervical, supraclavicular, and axillary nodes normal  Neurologic:   CNII-XII intact. Normal strength, sensation and reflexes      throughout    Psychiatric: Normal affect  Lab results: Basic Metabolic Panel:  Basename 12/15/11 1520  NA 134*  K 4.2  CL 99  CO2 24  GLUCOSE 205*  BUN 14  CREATININE 0.90  CALCIUM 9.7  MG --  PHOS --   Liver Function Tests: No results found for this basename: AST:2,ALT:2,ALKPHOS:2,BILITOT:2,PROT:2,ALBUMIN:2 in the last 72 hours No results found for this basename: LIPASE:2,AMYLASE:2 in the last 72 hours No results found for this basename: AMMONIA:2 in the last 72 hours CBC:  Basename 12/15/11 1520  WBC 7.1  NEUTROABS --  HGB 11.9*  HCT 35.8*  MCV 90.6  PLT 192   Cardiac Enzymes:  Basename 12/15/11 1520  CKTOTAL 520*  CKMB 13.2*  CKMBINDEX --  TROPONINI <0.30   BNP: No results found for this basename: PROBNP:3 in the last 72 hours D-Dimer: No results found for this basename: DDIMER:2 in the last 72 hours CBG: No results found for this basename: GLUCAP:6 in the last 72 hours Hemoglobin A1C: No results found for this basename: HGBA1C in the last 72 hours Fasting Lipid Panel: No results found for this basename: CHOL,HDL,LDLCALC,TRIG,CHOLHDL,LDLDIRECT in the last 72 hours Thyroid Function Tests: No results found for this basename: TSH,T4TOTAL,FREET4,T3FREE,THYROIDAB in the last 72 hours Anemia Panel: No results found for this basename: VITAMINB12,FOLATE,FERRITIN,TIBC,IRON,RETICCTPCT in the last 72 hours Coagulation:  Basename 12/15/11 1520  LABPROT 12.8  INR 0.94    Imaging results:  Dg Chest 2 View  12/15/2011  *RADIOLOGY REPORT*  Clinical Data:  Chest pain, weakness  CHEST - 2 VIEW  Comparison: 11/01/2011  Findings: Previous CABG.  Lungs are clear.  Heart size normal.  No effusion.  Minimal spondylitic change in the mid and lower thoracic spine.  Atheromatous aorta.  IMPRESSION:  1.  No acute disease post CABG.  Original Report Authenticated By: Osa Craver, M.D.   EKG NSR with inferior T wave inversions (old)  Assessment & Plan: Active Problems:  CAD (coronary artery disease)  Unstable angina  DM  GERD  HTN (hypertension)  HYPERLIPIDEMIA, MIXED  Patient is currently pain free. He will be admitted to telemetry. Continue aspirin. Oxygen. Beta blockers. Rule out MI. Full dose Lovenox. Consult cardiology in  the morning. Continue outpatient medications. monitor blood glucose.   Sanika Brosious L 12/15/2011, 5:24 PM

## 2011-12-15 NOTE — ED Notes (Signed)
Feels weak, had chest pain that felt like "indigestion", No chest pain now, but feels "like I can't go"

## 2011-12-15 NOTE — ED Notes (Signed)
Dr Lendell Caprice here to eval for admission

## 2011-12-15 NOTE — ED Notes (Signed)
Lab called critical ckmb of 13.2 . Result repeated. edp aware

## 2011-12-15 NOTE — ED Notes (Signed)
Pt states "indigestion" pain began after lunch today. NAD. Pt had two cardiac stents placed last month, per pt.

## 2011-12-15 NOTE — ED Provider Notes (Signed)
History   This chart was scribed for Jeffrey Octave, MD by [name]. The patient was seen in room APA06/[facility]06. Patient's care was started at 1457.    CSN: [id number]  Arrival date & time 12/15/11  1457   First MD Initiated Contact with Patient 12/15/11 1523      Chief Complaint  Patient presents with  . Chest Pain    (Consider location/radiation/quality/duration/timing/severity/associated sxs/prior treatment) HPI Jeffrey [name] is a 78 y.o. male who presents to the Emergency Department complaining of an episode of moderate chest pain localized to sternal region which began this morning but resolved after taking Tums/Alkaseltzer with associated generalized weakness and SOB. Patient states chest pain he experienced was similar in nature to that experienced with past MI. Denies nausea, vomiting, HA, numbness, tingling, diaphoresis. Patient with h/o diabetes, hypercholesterolemia, CAD, diabetes, HTN, PUD, MI, GERD, stroke, pneumonia.   Past Medical History  Diagnosis Date  . Diabetes mellitus   . Hypercholesterolemia   . Coronary artery disease     a.  s/p CABG x 3 1989, VG->LCX, VG->RCA, LIMA->LAD;  b. 05/24/10 Cath/PCI: 3vd, LIMA patent, VG->RCA occluded, VG->LCX 90 in graft (3.0x15 promus) & 90 in distal LCX (2.75x15 Promus);  c. NSTEMI in 04/2011 - Cath occlusion of distal LCX stent.   . Coronary artery disease     a.  s/p CABG 1999;  b. s/p stenting to distal LCx, s/p stent to proximal SVG-LCx in 05/2010;  c. NSTEMI in 04/2011 with cath showing occluded OM2 graft   . Diabetes mellitus   . Hypertension   . PUD (peptic ulcer disease)   . Hypercholesteremia   . Myocardial infarct, old   . GERD (gastroesophageal reflux disease)   . Arthritis   . Osteoarthritis   . Stroke   . Pneumonia     Past Surgical History  Procedure Date  . Coronary angioplasty with stent placement   . Tonsillectomy   . Post aortocoronary bypass surgery   . Coronary artery bypass graft   .  Coronary stent placement   . Coronary angioplasty     History reviewed. No pertinent family history.  History  Substance Use Topics  . Smoking status: Former Smoker    Types: Cigarettes  . Smokeless tobacco: Current User    Types: Chew  . Alcohol Use: No      Review of Systems A complete 10 system review of systems was obtained and all systems are negative except as noted in the HPI and PMH.   Allergies  Review of patient's allergies indicates no known allergies.  Home Medications   No current outpatient prescriptions on file.  BP 161/67  Pulse 79  Temp(Src) 98.5 F (36.9 C) (Oral)  Resp 20  Ht 5\' 10"  (1.778 m)  Wt 176 lb 12.8 oz (80.196 kg)  BMI 25.37 kg/m2  SpO2 94%  Physical Exam  Nursing note and vitals reviewed. Constitutional: He is oriented to person, place, and time. He appears well-developed and well-nourished. No distress.  HENT:  Head: Normocephalic and atraumatic.  Mouth/Throat: Oropharynx is clear and moist.  Eyes: EOM are normal. Pupils are equal, round, and reactive to light.  Neck: Neck supple. No tracheal deviation present.  Cardiovascular: Normal rate.   Pulmonary/Chest: Effort normal. No respiratory distress.  Abdominal: Soft. He exhibits no distension.  Musculoskeletal: Normal range of motion. He exhibits no edema.  Neurological: He is alert and oriented to person, place, and time. He has normal strength. No sensory deficit.  Skin: Skin  is warm and dry.  Psychiatric: He has a normal mood and affect. His behavior is normal.    ED Course  Procedures (including critical care time)  DIAGNOSTIC STUDIES: Oxygen Saturation is 98% on room air, normal by my interpretation.    COORDINATION OF CARE: 3:28PM-Patient informed of current plan for treatment and evaluation and agrees with plan at this time.     Labs Reviewed  CBC - Abnormal; Notable for the following:    RBC 3.95 (*)    Hemoglobin 11.9 (*)    HCT 35.8 (*)    All other  components within normal limits  BASIC METABOLIC PANEL - Abnormal; Notable for the following:    Sodium 134 (*)    Glucose, Bld 205 (*)    GFR calc non Af Amer 78 (*)    All other components within normal limits  CARDIAC PANEL(CRET KIN+CKTOT+MB+TROPI) - Abnormal; Notable for the following:    Total CK 520 (*)    CK, MB 13.2 (*)    All other components within normal limits  CARDIAC PANEL(CRET KIN+CKTOT+MB+TROPI) - Abnormal; Notable for the following:    Total CK 437 (*)    CK, MB 10.8 (*)    All other components within normal limits  CARDIAC PANEL(CRET KIN+CKTOT+MB+TROPI) - Abnormal; Notable for the following:    Total CK 371 (*)    CK, MB 10.2 (*)    Relative Index 2.7 (*)    All other components within normal limits  GLUCOSE, CAPILLARY - Abnormal; Notable for the following:    Glucose-Capillary 176 (*)    All other components within normal limits  GLUCOSE, CAPILLARY - Abnormal; Notable for the following:    Glucose-Capillary 200 (*)    All other components within normal limits  PROTIME-INR  HEMOGLOBIN A1C   Dg Chest 2 View  12/15/2011  *RADIOLOGY REPORT*  Clinical Data: Chest pain, weakness  CHEST - 2 VIEW  Comparison: 11/01/2011  Findings: Previous CABG.  Lungs are clear.  Heart size normal.  No effusion.  Minimal spondylitic change in the mid and lower thoracic spine.  Atheromatous aorta.  IMPRESSION:  1.  No acute disease post CABG.  Original Report Authenticated By: Thora Lance III, M.D.     1. Chest pain       MDM  Substernal chest pain felt like indigestion this morning but similar to previous MIs. Followed by episode of lightheadedness, generalized weakness and shortness of breath.   Cardiac cath April 2012: Cardiac cath this admission showed subtotally occluded proximal SVG to OM 2; s/p PCI and restenting of a circumflex obtuse marginal branch stent for in-stent restenosis with overlapping drug-eluting stents  EKG unchanged, troponin negative.  D/w Dr. Eden Emms  of cardiology who states ok for patient to stay at [facility] for rule out.   Date: 12/15/2011  Rate: 78  Rhythm: normal sinus rhythm  QRS Axis: normal  Intervals: normal  ST/T Wave abnormalities: nonspecific ST/T changes  Conduction Disutrbances:none  Narrative Interpretation: inferior T wave inversions unchanged. ST depressions in anterior leads improved  Old EKG Reviewed: changes noted    I personally performed the services described in this documentation, which was scribed in my presence.  The recorded information has been reviewed and considered.    Jeffrey Octave, MD 12/16/11 1153

## 2011-12-16 DIAGNOSIS — I2510 Atherosclerotic heart disease of native coronary artery without angina pectoris: Secondary | ICD-10-CM

## 2011-12-16 DIAGNOSIS — R748 Abnormal levels of other serum enzymes: Secondary | ICD-10-CM

## 2011-12-16 DIAGNOSIS — I10 Essential (primary) hypertension: Secondary | ICD-10-CM

## 2011-12-16 DIAGNOSIS — I200 Unstable angina: Secondary | ICD-10-CM

## 2011-12-16 DIAGNOSIS — E1165 Type 2 diabetes mellitus with hyperglycemia: Secondary | ICD-10-CM

## 2011-12-16 DIAGNOSIS — R079 Chest pain, unspecified: Principal | ICD-10-CM

## 2011-12-16 LAB — GLUCOSE, CAPILLARY
Glucose-Capillary: 116 mg/dL — ABNORMAL HIGH (ref 70–99)
Glucose-Capillary: 176 mg/dL — ABNORMAL HIGH (ref 70–99)
Glucose-Capillary: 200 mg/dL — ABNORMAL HIGH (ref 70–99)

## 2011-12-16 LAB — HEMOGLOBIN A1C
Hgb A1c MFr Bld: 7.3 % — ABNORMAL HIGH (ref <5.7)
Mean Plasma Glucose: 163 mg/dL — ABNORMAL HIGH (ref <117)

## 2011-12-16 MED ORDER — AMLODIPINE BESYLATE 5 MG PO TABS
5.0000 mg | ORAL_TABLET | Freq: Every day | ORAL | Status: DC
  Administered 2011-12-16 – 2011-12-17 (×2): 5 mg via ORAL
  Filled 2011-12-16 (×2): qty 1

## 2011-12-16 MED ORDER — PANTOPRAZOLE SODIUM 40 MG PO TBEC
40.0000 mg | DELAYED_RELEASE_TABLET | Freq: Two times a day (BID) | ORAL | Status: DC
  Administered 2011-12-17: 40 mg via ORAL
  Filled 2011-12-16: qty 1

## 2011-12-16 MED ORDER — CARVEDILOL 12.5 MG PO TABS
12.5000 mg | ORAL_TABLET | Freq: Two times a day (BID) | ORAL | Status: DC
  Administered 2011-12-17: 12.5 mg via ORAL
  Filled 2011-12-16: qty 1

## 2011-12-16 NOTE — Progress Notes (Signed)
UR Chart Review Completed  

## 2011-12-16 NOTE — Progress Notes (Signed)
Subjective: No further discomfort, shortness of breath, nausea. Does not remember me from yesterday.  Objective: Vital signs in last 24 hours: Filed Vitals:   12/15/11 1900 12/15/11 2150 12/16/11 0556 12/16/11 0705  BP: 134/73 147/69 161/67   Pulse: 79 77 68 79  Temp: 98.2 F (36.8 C) 98.1 F (36.7 C) 98.5 F (36.9 C)   TempSrc: Oral Oral Oral   Resp: 18 20 20    Height: 5\' 10"  (1.778 m)     Weight: 80.196 kg (176 lb 12.8 oz)     SpO2:  96% 98% 94%   Weight change:   Intake/Output Summary (Last 24 hours) at 12/16/11 1159 Last data filed at 12/16/11 0808  Gross per 24 hour  Intake    270 ml  Output    900 ml  Net   -630 ml   Exam unchanged from 12/15/2011  Lab Results: Basic Metabolic Panel:  Lab 12/15/11 4098  NA 134*  K 4.2  CL 99  CO2 24  GLUCOSE 205*  BUN 14  CREATININE 0.90  CALCIUM 9.7  MG --  PHOS --   Liver Function Tests: No results found for this basename: AST:2,ALT:2,ALKPHOS:2,BILITOT:2,PROT:2,ALBUMIN:2 in the last 168 hours No results found for this basename: LIPASE:2,AMYLASE:2 in the last 168 hours No results found for this basename: AMMONIA:2 in the last 168 hours CBC:  Lab 12/15/11 1520  WBC 7.1  NEUTROABS --  HGB 11.9*  HCT 35.8*  MCV 90.6  PLT 192   Cardiac Enzymes:  Lab 12/16/11 0325 12/15/11 2102 12/15/11 1520  CKTOTAL 371* 437* 520*  CKMB 10.2* 10.8* 13.2*  CKMBINDEX -- -- --  TROPONINI <0.30 <0.30 <0.30   BNP: No results found for this basename: PROBNP:3 in the last 168 hours D-Dimer: No results found for this basename: DDIMER:2 in the last 168 hours CBG:  Lab 12/16/11 1109 12/16/11 0712  GLUCAP 200* 176*   Hemoglobin A1C: No results found for this basename: HGBA1C in the last 168 hours Fasting Lipid Panel: No results found for this basename: CHOL,HDL,LDLCALC,TRIG,CHOLHDL,LDLDIRECT in the last 119 hours Thyroid Function Tests: No results found for this basename: TSH,T4TOTAL,FREET4,T3FREE,THYROIDAB in the last 168  hours Coagulation:  Lab 12/15/11 1520  LABPROT 12.8  INR 0.94   Micro Results: No results found for this or any previous visit (from the past 240 hour(s)). Studies/Results: Dg Chest 2 View  12/15/2011  *RADIOLOGY REPORT*  Clinical Data: Chest pain, weakness  CHEST - 2 VIEW  Comparison: 11/01/2011  Findings: Previous CABG.  Lungs are clear.  Heart size normal.  No effusion.  Minimal spondylitic change in the mid and lower thoracic spine.  Atheromatous aorta.  IMPRESSION:  1.  No acute disease post CABG.  Original Report Authenticated By: Thora Lance III, M.D.   Scheduled Meds:   . sodium chloride   Intravenous STAT  . aspirin  324 mg Oral Once  . aspirin  325 mg Oral Daily  . atorvastatin  40 mg Oral q1800  . carvedilol  6.25 mg Oral BID WC  . docusate sodium  200 mg Oral QHS  . isosorbide mononitrate  30 mg Oral Daily  . linagliptin  5 mg Oral Daily  . metFORMIN  500 mg Oral BID WC  . ondansetron  4 mg Intravenous Once  . pantoprazole  40 mg Oral Daily  . ramipril  5 mg Oral Daily  . sodium chloride  3 mL Intravenous Q12H  . Ticagrelor  90 mg Oral BID   Continuous Infusions:   .  DISCONTD: sodium chloride 20 mL (12/15/11 1548)   PRN Meds:.acetaminophen, acetaminophen, calcium carbonate, morphine injection, nitroGLYCERIN, ondansetron (ZOFRAN) IV, ondansetron (ZOFRAN) IV, ondansetron, traMADol Assessment/Plan: Active Problems:  CAD (coronary artery disease)  Unstable angina  DM  GERD  HTN (hypertension)  HYPERLIPIDEMIA, MIXED  MI ruled out. Continue current and await final recommendations from cardiology.   LOS: 1 day   Oswald Pott L 12/16/2011, 11:59 AM

## 2011-12-16 NOTE — Consult Note (Signed)
CARDIOLOGY CONSULT NOTE  Patient ID: Jeffrey [name] MRN: [id number] DOB/AGE: 1929-03-21 78 y.o.  Admit date: 12/15/2011 Referring Physician: Darnelle Spangle MD Primary PhysicianFUSCO,LAWRENCE J., MD, MD Primary Cardiologist:Jeffrey Boyadjian, Molly Maduro MD Reason for Consultation: Chest pain with known CAD Active Problems:  DM  HYPERLIPIDEMIA, MIXED  GERD  HTN (hypertension)  CAD (coronary artery disease)  Unstable angina  HPI: Jeffrey [name] is a 78 y/o patient with known CAD, recent admission to [facility] hospital in April of 2013 for NSTEMI with subsequent cardiac cath. This demonstarated subtotally occluded proximal SVG to OM2. He required PCI and restenting of CX-OM graft with overlapping DES and at least one year of therapy with Brilinta and ASA.  He was seen in our office on 12/04/2011 with complaints of fatigue. He was undergoing PT at [facility] for deconditioning at that time. He completed rehab one week ago and is now living at home.    On day of admission the patient states that he felt some heartburn in his lower abdomen radiating up into his chest which was relieved with Tums. Later that day when he went to go pay a bill he had sudden onset of generalized weakness and fatigue causing him to sit down. He drove to [facility] emergency room for evaluation. Similar symptoms preceded small non-ST segment elevation MIs in early in mid April.  He reports some lifting over the last few days and relates his symptoms to increased activity.    On arrival to ER BP 11/59, HR 77. He was afebrile. Troponin's have been negative X3. He was found to have elevated CK at 520, 437, and 371, respectively; CK-MB was found to be elevated at 13.2, 10.8, and 10.2 respectively, but CPK index is approximately 3. Creatinine 0.90. Hemoglobin was slightly decreased to 11.9 compared to hemoglobin of 12.7 approximately one month earlier. Platelets were stable. Chest x-ray showed no acute abnormalities. EKG was actually improved from prior  EKG completed in April of 2013 with normalization of T waves in V4 5 and 6 compared to T-wave inversion noted in April of 2013.  Review of systems complete and found to be negative unless listed above   Past Medical History  Diagnosis Date  . Diabetes mellitus   . Hypercholesterolemia   . Coronary artery disease     a.  s/p CABG x 3 1989, VG->LCX, VG->RCA, LIMA->LAD;  b. 05/24/10 Cath/PCI: 3vd, LIMA patent, VG->RCA occluded, VG->LCX 90 in graft (3.0x15 promus) & 90 in distal LCX (2.75x15 Promus);  c. NSTEMI in 04/2011 - Cath occlusion of distal LCX stent.   . Coronary artery disease     a.  s/p CABG 1999;  b. s/p stenting to distal LCx, s/p stent to proximal SVG-LCx in 05/2010;  c. NSTEMI in 04/2011 with cath showing occluded OM2 graft   . Diabetes mellitus   . Hypertension   . PUD (peptic ulcer disease)   . Hypercholesteremia   . Myocardial infarct, old   . GERD (gastroesophageal reflux disease)   . Arthritis   . Osteoarthritis   . Stroke   . Pneumonia     History reviewed. No pertinent family history.  History   Social History  . Marital Status: Divorced    Spouse Name: N/A    Number of Children: N/A  . Years of Education: N/A   Occupational History  . retired     Visual merchandiser   Social History Main Topics  . Smoking status: Former Smoker    Types: Cigarettes  . Smokeless tobacco:  Current User    Types: Chew  . Alcohol Use: No  . Drug Use: No  . Sexually Active: No   Other Topics Concern  . Not on file   Social History Narrative   ** Merged History Encounter **     Past Surgical History  Procedure Date  . Coronary angioplasty with stent placement   . Tonsillectomy   . Post aortocoronary bypass surgery   . Coronary artery bypass graft   . Coronary stent placement   . Coronary angioplasty     Prescriptions prior to admission  Medication Sig Dispense Refill  . aspirin 325 MG tablet Take 325 mg by mouth daily.      . carvedilol (COREG) 6.25 MG tablet Take 6.25 mg  by mouth 2 (two) times daily with a meal.      . isosorbide mononitrate (IMDUR) 30 MG 24 hr tablet Take 1 tablet (30 mg total) by mouth daily.  30 tablet  6  . loratadine (CLARITIN) 10 MG tablet Take 10 mg by mouth daily.        . metFORMIN (GLUCOPHAGE) 500 MG tablet Take 500 mg by mouth 2 (two) times daily.       . traMADol (ULTRAM) 50 MG tablet Take 50 mg by mouth every 6 (six) hours as needed. FOR PAIN      . aspirin-sod bicarb-citric acid (ALKA-SELTZER) 325 MG TBEF Take 325 mg by mouth daily as needed. For heartburn      . Bismuth Subsalicylate (PEPTO-BISMOL PO) Take by mouth daily as needed. Patient states that he just takes a couple of swallows when needed to control bowels.       . calcium carbonate (TUMS - DOSED IN MG ELEMENTAL CALCIUM) 500 MG chewable tablet Chew 2 tablets by mouth daily as needed.      . COD LIVER OIL PO Take 1 capsule by mouth daily.        Tery Sanfilippo Sodium (STOOL SOFTENER) 100 MG capsule Take 200-300 mg by mouth at bedtime. Constipation      . meclizine (ANTIVERT) 25 MG tablet Take 25 mg by mouth daily as needed. For dizziness      . nitroGLYCERIN (NITROSTAT) 0.4 MG SL tablet Place 1 tablet (0.4 mg total) under the tongue every 5 (five) minutes x 3 doses as needed for chest pain.  10 tablet  0  . pantoprazole (PROTONIX) 40 MG tablet Take 40 mg by mouth daily.      . ramipril (ALTACE) 5 MG capsule Take 5 mg by mouth daily.        . rosuvastatin (CRESTOR) 20 MG tablet Take 1 tablet (20 mg total) by mouth at bedtime.  30 tablet  2  . saxagliptin HCl (ONGLYZA) 5 MG TABS tablet Take 5 mg by mouth at bedtime.       . Ticagrelor (BRILINTA) 90 MG TABS tablet Take 1 tablet (90 mg total) by mouth 2 (two) times daily.  60 tablet  0   Physical Exam: Blood pressure 161/67, pulse 79, temperature 98.5 F (36.9 C), temperature source Oral, resp. rate 20, height 5\' 10"  (1.778 m), weight 176 lb 12.8 oz (80.196 kg), SpO2 94.00%.  General: Well developed, well nourished, in no acute  distress. Edentulous. Head: Eyes PERRLA, No xanthomas.   Normal cephalic and atramatic  Lungs: Clear bilaterally to auscultation and percussion. Crackles noted in the right base. Heart: HRRR S1 S2,distant systolic murmur..  Pulses are 2+ & equal.  No carotid bruit. No JVD.  No abdominal bruits. No femoral bruits. Abdomen: Bowel sounds are positive, abdomen soft and non-tender without masses. Msk:  Back normal, normal gait. Normal strength and tone for age. Extremities: No clubbing, cyanosis or edema.  DP +1; 2+ posterior tibial pulses Neuro: Alert and oriented X 3. Psych:  Good affect, responds appropriately   Lab Results  Component Value Date   WBC 7.1 12/15/2011   HGB 11.9* 12/15/2011   HCT 35.8* 12/15/2011   MCV 90.6 12/15/2011   PLT 192 12/15/2011    Lab 12/15/11 1520  NA 134*  K 4.2  CL 99  CO2 24  BUN 14  CREATININE 0.90  CALCIUM 9.7  PROT --  BILITOT --  ALKPHOS --  ALT --  AST --  GLUCOSE 205*    Radiology: Dg Chest 2 View  12/15/2011  *RADIOLOGY REPORT*  Clinical Data: Chest pain, weakness  CHEST - 2 VIEW  Comparison: 11/01/2011  Findings: Previous CABG.  Lungs are clear.  Heart size normal.  No effusion.  Minimal spondylitic change in the mid and lower thoracic spine.  Atheromatous aorta.  IMPRESSION:  1.  No acute disease post CABG.  Original Report Authenticated By: Thora Lance III, M.D.   EKG:NSR with improvement in lateral T-waves to normal compared to April EKG.   Lateral ST segment depression has resolved, but new T-wave inversions are present inferiorly.  ASSESSMENT AND PLAN:   1. Chest pain: Described more as indigestion, especially response to antacids, but similar to what he experienced with recent acute coronary syndrome x2. Troponins arehe negative x3 with no definite acute EKG abnormalities. It is noted that he does have an elevated CK and marginal CK-MB, possibly from skeletal muscle.  Doubt ACS causing symptoms, with negative troponins and EKG.  May need GI evaluation for chronic heartburn. He is on a PPI and often takes [place] daily.  2. CAD: Recent non-ST elevation MI in April of 2013. SVG to OM 2 was patent and occluded continuation with the proximal stent in the vein graft there was noted to have a 99% in-stent restenosis. Overall LVEF was estimated at 45%. He underwent PCI and restenting of the circumflex obtuse marginal graft stent, for in stent restenosis, with overlapping drug-eluting stents.  His hemoglobin has dropped 1 g since being checked approximately 3 weeks ago. We will check Hemoccults.  3. Hypertension: Essentially well-controlled with elevation noted this a.m. Would continue carvedilol 6.25 mg twice a day and Altace 5 mg by mouth daily.   4. Diabetes: Blood sugar has been elevated during this admission. He is on metformin 500 mg twice a day and liagliptin 5 mg daily. We will check a hemoglobin A1c. He may need adjustments in this coverage. Cannot rule out diabetic gastroparesis causing mild abdominal pain and indigestion. Would continue PPI as he was taking prior to admission.   Bettey Mare. Lyman Bishop NP [facility] 12/16/2011, 8:21 AM  Cardiology Attending Patient interviewed and examined. Discussed with Joni Reining, NP.  Above note annotated and modified based upon my findings.  Patient presents with recurrent symptoms but with equivocal cardiac markers for myocardial damage.  We will attempt to treat patient medically by increasing anti-anginal drugs and doubling the dose of his PPI.  He will require repeat cardiac catheterization should worrisome symptoms continue or troponin once again increased significantly.  He requests a decrease in the number of pills that he is required to take.  I will do all I  can to accomplish that for him.  Plains Bing, MD 12/16/2011, 7:54 PM

## 2011-12-16 NOTE — Care Management Note (Signed)
    Page 1 of 1   12/17/2011     11:20:30 AM   CARE MANAGEMENT NOTE 12/17/2011  Patient:  Jeffrey [name], Jeffrey [name]   Account Number:  [id number]  Date Initiated:  12/16/2011  Documentation initiated by:  Rosemary Holms  Subjective/Objective Assessment:   Pt admitted from home alone. Pt stated that he resently discharged from [facility] where he had PT fit for a 78 year old. He had recently had a stent placed at [facility]. Pt currently has [facility] RN in place and will want to resume that.     Action/Plan:   Pt plans to DC home and resume home RN. Will follow but pt is adamant that he does not want additional HHPT.   Anticipated DC Date:  12/18/2011   Anticipated DC Plan:  HOME W HOME HEALTH SERVICES      DC Planning Services  CM consult      Choice offered to / List presented to:          [facility] arranged  HH-1 RN  HH-10 DISEASE MANAGEMENT  HH-4 NURSE'S AIDE  HH-6 SOCIAL WORKER      HH agency  [facility]   Status of service:  Completed, signed off Medicare Important Message given?   (If response is "NO", the following Medicare IM given date fields will be blank) Date Medicare IM given:   Date Additional Medicare IM given:    Discharge Disposition:  HOME W HOME HEALTH SERVICES  Per UR Regulation:    If discussed at Long Length of Stay Meetings, dates discussed:    Comments:  12/17/11 1100 Almalik Weissberg Leanord Hawking RN BSN CM [facility] notified of DC. Will begin RN, Aid, SW within 48 hours.  12/16/11 1300 Devorah Givhan Leanord Hawking RN BSN CM

## 2011-12-17 DIAGNOSIS — R079 Chest pain, unspecified: Secondary | ICD-10-CM

## 2011-12-17 DIAGNOSIS — E1165 Type 2 diabetes mellitus with hyperglycemia: Secondary | ICD-10-CM

## 2011-12-17 DIAGNOSIS — I10 Essential (primary) hypertension: Secondary | ICD-10-CM

## 2011-12-17 LAB — GLUCOSE, CAPILLARY: Glucose-Capillary: 143 mg/dL — ABNORMAL HIGH (ref 70–99)

## 2011-12-17 MED ORDER — PANTOPRAZOLE SODIUM 40 MG PO TBEC: 40.0000 mg | DELAYED_RELEASE_TABLET | Freq: Two times a day (BID) | ORAL | Status: DC

## 2011-12-17 NOTE — Clinical Social Work Psychosocial (Signed)
Clinical Social Work Department BRIEF PSYCHOSOCIAL ASSESSMENT 12/17/2011  Patient:  Jeffrey [name], Jeffrey [name]     Account Number:  [id number]     Admit date:  12/15/2011  Clinical Social Worker:  Nancie Neas  Date/Time:  12/17/2011 09:25 AM  Referred by:  Care Management  Date Referred:  12/16/2011 Referred for  ?ALF Placement   Other Referral:   Interview type:  Patient Other interview type:   and daughter    PSYCHOSOCIAL DATA Living Status:  ALONE Admitted from facility:   Level of care:   Primary support name:  Court Joy Primary support relationship to patient:  CHILD, ADULT Degree of support available:   very supportive per pt    CURRENT CONCERNS Current Concerns  Other - See comment   Other Concerns:   d/c planning    SOCIAL WORK ASSESSMENT / PLAN CSW received call from pt's daughter on 6/4 to discuss concerns regarding pt's d/c plan.  Family appears to be involved and supportive and appropriately concerned about pt's living situation.  Pt lives in a trailer alone and is secluded.  His daughter takes meals to him and checks on him often.  [name] has suggested that pt move to an apartment in order to have close neighbors or go to an assisted living facility.  CSW met with pt on 6/5 to discuss these options.  He was very willing to move to an apartment and continue with home health services but stated that he was not old enough to go to an assisted living.  Pt reports that he was a resident at [facility] recently and completed rehab which has enabled him to walk better.   Assessment/plan status:  No Further Intervention Required Other assessment/ plan:   Information/referral to community resources:   n/a    PATIENT'S/FAMILY'S RESPONSE TO PLAN OF CARE: Pt feels he manages well enough to continue living independently at this point.  He will share our discussion with daughter and plans to move into an apartment soon. CSW will sign off. CM aware of needs.     Derenda Fennel,  [place] [phone]

## 2011-12-17 NOTE — Progress Notes (Signed)
Subjective:  No further chest pain. Wanting to go home.  Objective:  Vital Signs in the last 24 hours: Temp:  [98.1 F (36.7 C)-98.7 F (37.1 C)] 98.1 F (36.7 C) (06/05 0533) Pulse Rate:  [65-75] 75  (06/05 0533) Resp:  [18] 18  (06/05 0533) BP: (126-167)/(63-78) 167/76 mmHg (06/05 0533) SpO2:  [95 %-97 %] 95 % (06/05 0533)  Intake/Output from previous day: 06/04 0701 - 06/05 0700 In: 990 [P.O.:990] Out: 200 [Urine:200] Intake/Output from this shift:    Physical Exam: NECK: Without JVD, HJR, or bruit LUNGS: Clear anterior, posterior, lateral HEART: Regular rate and rhythm, no murmur, gallop, rub, bruit, thrill, or heave EXTREMITIES: Without cyanosis, clubbing, or edema   Lab Results:  Basename 12/15/11 1520  WBC 7.1  HGB 11.9*  PLT 192    Basename 12/15/11 1520  NA 134*  K 4.2  CL 99  CO2 24  GLUCOSE 205*  BUN 14  CREATININE 0.90    Basename 12/16/11 0325 12/15/11 2102  TROPONINI <0.30 <0.30   Hepatic Function Panel No results found for this basename: PROT,ALBUMIN,AST,ALT,ALKPHOS,BILITOT,BILIDIR,IBILI in the last 72 hours No results found for this basename: CHOL in the last 72 hours No results found for this basename: PROTIME in the last 72 hours  Imaging:   Cardiac Studies:  Assessment/Plan:  1. Chest pain: Patient presents with recurrent symptoms but with equivocal cardiac markers for myocardial damage.  We will attempt to treat patient medically by increasing anti-anginal drugs and doubling the dose of his PPI.  He will require repeat cardiac catheterization should worrisome symptoms continue or troponin once again increased significantly. Patient has had no further chest pain since admission and would like to be discharged.He will need close follow-up.  2. CAD: Recent non-ST elevation MI in April of 2013. SVG to OM 2 was patent and occluded continuation with the proximal stent in the vein graft there was noted to have a 99% in-stent restenosis.  Overall LVEF was estimated at 45%. He underwent PCI and restenting of the circumflex obtuse marginal graft stent, for in stent restenosis, with overlapping drug-eluting stents.   3. Hypertension:carvedilol increased yesterday.   4. Diabetes   LOS: 2 days    Jacolyn Reedy 12/17/2011, 8:47 AM   Patient examined and agree except changes made. I discussed with Dr Lendell Caprice and we will discharge today. I'll arrange close followup in cardiology next week. I reviewed at length nitroglycerin protocol. He is a very difficult historian. He knows not to come to the emergency room if his discomfort response to nitroglycerin or tums.  Valera Castle, MD 12/17/2011 11:00 AM

## 2011-12-17 NOTE — Progress Notes (Deleted)
Physician Discharge Summary  Patient ID: Jeffrey [name] MRN: [id number] DOB/AGE: Oct 31, 1928 78 y.o.  Admit date: 12/15/2011 Discharge date: 12/17/2011  Discharge Diagnoses:  Chest pain  CAD (coronary artery disease)  DM  GERD  HTN (hypertension)  HYPERLIPIDEMIA, MIXED   Medication List  As of 12/17/2011 10:47 AM   TAKE these medications         aspirin 325 MG tablet   Take 325 mg by mouth daily.      aspirin-sod bicarb-citric acid 325 MG Tbef   Commonly known as: ALKA-SELTZER   Take 325 mg by mouth daily as needed. For heartburn      calcium carbonate 500 MG chewable tablet   Commonly known as: TUMS - dosed in mg elemental calcium   Chew 2 tablets by mouth daily as needed.      carvedilol 6.25 MG tablet   Commonly known as: COREG   Take 6.25 mg by mouth 2 (two) times daily with a meal.      COD LIVER OIL PO   Take 1 capsule by mouth daily.      isosorbide mononitrate 30 MG 24 hr tablet   Commonly known as: IMDUR   Take 1 tablet (30 mg total) by mouth daily.      loratadine 10 MG tablet   Commonly known as: CLARITIN   Take 10 mg by mouth daily.      meclizine 25 MG tablet   Commonly known as: ANTIVERT   Take 25 mg by mouth daily as needed. For dizziness      metFORMIN 500 MG tablet   Commonly known as: GLUCOPHAGE   Take 500 mg by mouth 2 (two) times daily.      nitroGLYCERIN 0.4 MG SL tablet   Commonly known as: NITROSTAT   Place 1 tablet (0.4 mg total) under the tongue every 5 (five) minutes x 3 doses as needed for chest pain.      ONGLYZA 5 MG Tabs tablet   Generic drug: saxagliptin HCl   Take 5 mg by mouth at bedtime.      pantoprazole 40 MG tablet   Commonly known as: PROTONIX   Take 1 tablet (40 mg total) by mouth 2 (two) times daily.      PEPTO-BISMOL PO   Take by mouth daily as needed. Patient states that he just takes a couple of swallows when needed to control bowels.      ramipril 5 MG capsule   Commonly known as: ALTACE   Take 5 mg by mouth  daily.      rosuvastatin 20 MG tablet   Commonly known as: CRESTOR   Take 1 tablet (20 mg total) by mouth at bedtime.      STOOL SOFTENER 100 MG capsule   Generic drug: Docusate Sodium   Take 200-300 mg by mouth at bedtime. Constipation      Ticagrelor 90 MG Tabs tablet   Commonly known as: BRILINTA   Take 1 tablet (90 mg total) by mouth 2 (two) times daily.      traMADol 50 MG tablet   Commonly known as: ULTRAM   Take 50 mg by mouth every 6 (six) hours as needed. FOR PAIN            Discharge Orders    Future Appointments: Provider: Department: Dept Phone: Center:   03/05/2012 11:00 AM Jodelle Gross, NP [facility] [phone] [id number]     Future Orders Please Complete By Expires   Diet -  low sodium heart healthy      Activity as tolerated - No restrictions         Follow-up Information    Follow up with [place] Bing, MD in 1 week.   Contact information:   [address] [place] [address] [phone]         Disposition: home with home RN, aide and social worker  Discharged Condition: stable  Consults: Treatment Team:  Kathlen Brunswick, MD  Labs:   Results for orders placed during the hospital encounter of 12/15/11 (from the past 48 hour(s))  CBC     Status: Abnormal   Collection Time   12/15/11  3:20 PM      Component Value Range Comment   WBC 7.1  4.0 - 10.5 (K/uL)    RBC 3.95 (*) 4.22 - 5.81 (MIL/uL)    Hemoglobin 11.9 (*) 13.0 - 17.0 (g/dL)    HCT 91.4 (*) 78.2 - 52.0 (%)    MCV 90.6  78.0 - 100.0 (fL)    MCH 30.1  26.0 - 34.0 (pg)    MCHC 33.2  30.0 - 36.0 (g/dL)    RDW 95.6  21.3 - 08.6 (%)    Platelets 192  150 - 400 (K/uL)   BASIC METABOLIC PANEL     Status: Abnormal   Collection Time   12/15/11  3:20 PM      Component Value Range Comment   Sodium 134 (*) 135 - 145 (mEq/L)    Potassium 4.2  3.5 - 5.1 (mEq/L)    Chloride 99  96 - 112 (mEq/L)    CO2 24  19 - 32 (mEq/L)    Glucose, Bld 205 (*) 70 - 99  (mg/dL)    BUN 14  6 - 23 (mg/dL)    Creatinine, Ser 5.78  0.50 - 1.35 (mg/dL)    Calcium 9.7  8.4 - 10.5 (mg/dL)    GFR calc non Af Amer 78 (*) >90 (mL/min)    GFR calc Af Amer >90  >90 (mL/min)   CARDIAC PANEL(CRET KIN+CKTOT+MB+TROPI)     Status: Abnormal   Collection Time   12/15/11  3:20 PM      Component Value Range Comment   Total CK 520 (*) 7 - 232 (U/L)    CK, MB 13.2 (*) 0.3 - 4.0 (ng/mL)    Troponin I <0.30  <0.30 (ng/mL)    Relative Index 2.5  0.0 - 2.5    PROTIME-INR     Status: Normal   Collection Time   12/15/11  3:20 PM      Component Value Range Comment   Prothrombin Time 12.8  11.6 - 15.2 (seconds)    INR 0.94  0.00 - 1.49    CARDIAC PANEL(CRET KIN+CKTOT+MB+TROPI)     Status: Abnormal   Collection Time   12/15/11  9:02 PM      Component Value Range Comment   Total CK 437 (*) 7 - 232 (U/L)    CK, MB 10.8 (*) 0.3 - 4.0 (ng/mL)    Troponin I <0.30  <0.30 (ng/mL)    Relative Index 2.5  0.0 - 2.5    GLUCOSE, CAPILLARY     Status: Abnormal   Collection Time   12/15/11  9:53 PM      Component Value Range Comment   Glucose-Capillary 191 (*) 70 - 99 (mg/dL)    Comment 1 Notify RN      Comment 2 Documented in Chart  CARDIAC PANEL(CRET KIN+CKTOT+MB+TROPI)     Status: Abnormal   Collection Time   12/16/11  3:25 AM      Component Value Range Comment   Total CK 371 (*) 7 - 232 (U/L)    CK, MB 10.2 (*) 0.3 - 4.0 (ng/mL)    Troponin I <0.30  <0.30 (ng/mL)    Relative Index 2.7 (*) 0.0 - 2.5    GLUCOSE, CAPILLARY     Status: Abnormal   Collection Time   12/16/11  7:12 AM      Component Value Range Comment   Glucose-Capillary 176 (*) 70 - 99 (mg/dL)    Comment 1 Notify RN     HEMOGLOBIN A1C     Status: Abnormal   Collection Time   12/16/11  9:21 AM      Component Value Range Comment   Hemoglobin A1C 7.3 (*) <5.7 (%)    Mean Plasma Glucose 163 (*) <117 (mg/dL)   GLUCOSE, CAPILLARY     Status: Abnormal   Collection Time   12/16/11 11:09 AM      Component Value Range Comment     Glucose-Capillary 200 (*) 70 - 99 (mg/dL)    Comment 1 Notify RN     GLUCOSE, CAPILLARY     Status: Abnormal   Collection Time   12/16/11  4:34 PM      Component Value Range Comment   Glucose-Capillary 171 (*) 70 - 99 (mg/dL)    Comment 1 Documented in Chart      Comment 2 Notify RN     GLUCOSE, CAPILLARY     Status: Abnormal   Collection Time   12/16/11  9:02 PM      Component Value Range Comment   Glucose-Capillary 118 (*) 70 - 99 (mg/dL)    Comment 1 Documented in Chart      Comment 2 Notify RN     GLUCOSE, CAPILLARY     Status: Abnormal   Collection Time   12/16/11 11:58 PM      Component Value Range Comment   Glucose-Capillary 116 (*) 70 - 99 (mg/dL)    Comment 1 Documented in Chart      Comment 2 Notify RN     GLUCOSE, CAPILLARY     Status: Abnormal   Collection Time   12/17/11  4:18 AM      Component Value Range Comment   Glucose-Capillary 143 (*) 70 - 99 (mg/dL)    Comment 1 Documented in Chart      Comment 2 Notify RN     GLUCOSE, CAPILLARY     Status: Abnormal   Collection Time   12/17/11  7:11 AM      Component Value Range Comment   Glucose-Capillary 164 (*) 70 - 99 (mg/dL)    Comment 1 Notify RN       Diagnostics:  Dg Chest 2 View  12/15/2011  *RADIOLOGY REPORT*  Clinical Data: Chest pain, weakness  CHEST - 2 VIEW  Comparison: 11/01/2011  Findings: Previous CABG.  Lungs are clear.  Heart size normal.  No effusion.  Minimal spondylitic change in the mid and lower thoracic spine.  Atheromatous aorta.  IMPRESSION:  1.  No acute disease post CABG.  Original Report Authenticated By: Osa Craver, M.D.   EKG: EKG NSR with inferior T wave inversions (old)   Full Code   Hospital Course: See H&P for complete admission details. The patient is a pleasant 78 year old white male with history of heart  disease and recent stents who presented with indigestion nausea diaphoresis weakness and dizziness. His symptoms felt similar to previous episodes of MI. In the emergency  room, he had negative troponin, slightly elevated CPK and CPK-MB. EKG actually showed improved ST changes compared to previous, as well as old inferior T wave inversions. The lobar cardiologist on call was contacted by the ED physician.  Dr. Eden Emms and recommended observation at [facility] for rule out MI. Patient had serially negative troponins. No return of his symptoms. Cardiology was consulted and recommended increasing his protonix twice a day and close outpatient followup. He is cleared for discharge. Family called Child psychotherapist and requested assisted living, but patient declines. He has however agreed to a nurse, aid, and social worker to visit the home.  Discharge Exam:  Blood pressure 167/76, pulse 75, temperature 98.1 F (36.7 C), temperature source Oral, resp. rate 18, height 5\' 10"  (1.778 m), weight 80.196 kg (176 lb 12.8 oz), SpO2 95.00%.  Exam unchanged from 12/16/2011  Signed: Crista Curb L 12/17/2011, 10:47 AM

## 2011-12-17 NOTE — Discharge Summary (Signed)
Physician Discharge Summary  Patient ID: Jeffrey [name] MRN: [id number] DOB/AGE: 09/02/1928 78 y.o.  Admit date: 12/15/2011 Discharge date: 12/17/2011  Discharge Diagnoses:  Chest pain  CAD (coronary artery disease)  DM  GERD  HTN (hypertension)  HYPERLIPIDEMIA, MIXED   Medication List  As of 12/17/2011 10:47 AM   TAKE these medications         aspirin 325 MG tablet   Take 325 mg by mouth daily.      aspirin-sod bicarb-citric acid 325 MG Tbef   Commonly known as: ALKA-SELTZER   Take 325 mg by mouth daily as needed. For heartburn      calcium carbonate 500 MG chewable tablet   Commonly known as: TUMS - dosed in mg elemental calcium   Chew 2 tablets by mouth daily as needed.      carvedilol 6.25 MG tablet   Commonly known as: COREG   Take 6.25 mg by mouth 2 (two) times daily with a meal.      COD LIVER OIL PO   Take 1 capsule by mouth daily.      isosorbide mononitrate 30 MG 24 hr tablet   Commonly known as: IMDUR   Take 1 tablet (30 mg total) by mouth daily.      loratadine 10 MG tablet   Commonly known as: CLARITIN   Take 10 mg by mouth daily.      meclizine 25 MG tablet   Commonly known as: ANTIVERT   Take 25 mg by mouth daily as needed. For dizziness      metFORMIN 500 MG tablet   Commonly known as: GLUCOPHAGE   Take 500 mg by mouth 2 (two) times daily.      nitroGLYCERIN 0.4 MG SL tablet   Commonly known as: NITROSTAT   Place 1 tablet (0.4 mg total) under the tongue every 5 (five) minutes x 3 doses as needed for chest pain.      ONGLYZA 5 MG Tabs tablet   Generic drug: saxagliptin HCl   Take 5 mg by mouth at bedtime.      pantoprazole 40 MG tablet   Commonly known as: PROTONIX   Take 1 tablet (40 mg total) by mouth 2 (two) times daily.      PEPTO-BISMOL PO   Take by mouth daily as needed. Patient states that he just takes a couple of swallows when needed to control bowels.      ramipril 5 MG capsule   Commonly known as: ALTACE   Take 5 mg by mouth  daily.      rosuvastatin 20 MG tablet   Commonly known as: CRESTOR   Take 1 tablet (20 mg total) by mouth at bedtime.      STOOL SOFTENER 100 MG capsule   Generic drug: Docusate Sodium   Take 200-300 mg by mouth at bedtime. Constipation      Ticagrelor 90 MG Tabs tablet   Commonly known as: BRILINTA   Take 1 tablet (90 mg total) by mouth 2 (two) times daily.      traMADol 50 MG tablet   Commonly known as: ULTRAM   Take 50 mg by mouth every 6 (six) hours as needed. FOR PAIN            Discharge Orders    Future Appointments: Provider: Department: Dept Phone: Center:   03/05/2012 11:00 AM Kathryn M Lawrence, NP [facility] [phone] [id number]     Future Orders Please Complete By Expires   Diet -   low sodium heart healthy      Activity as tolerated - No restrictions         Follow-up Information    Follow up with Robert Rothbart, MD in 1 week.   Contact information:   [address] [place] [address] [phone]         Disposition: home with home RN, aide and social worker  Discharged Condition: stable  Consults: Treatment Team:  Robert M Rothbart, MD  Labs:   Results for orders placed during the hospital encounter of 12/15/11 (from the past 48 hour(s))  CBC     Status: Abnormal   Collection Time   12/15/11  3:20 PM      Component Value Range Comment   WBC 7.1  4.0 - 10.5 (K/uL)    RBC 3.95 (*) 4.22 - 5.81 (MIL/uL)    Hemoglobin 11.9 (*) 13.0 - 17.0 (g/dL)    HCT 35.8 (*) 39.0 - 52.0 (%)    MCV 90.6  78.0 - 100.0 (fL)    MCH 30.1  26.0 - 34.0 (pg)    MCHC 33.2  30.0 - 36.0 (g/dL)    RDW 13.5  11.5 - 15.5 (%)    Platelets 192  150 - 400 (K/uL)   BASIC METABOLIC PANEL     Status: Abnormal   Collection Time   12/15/11  3:20 PM      Component Value Range Comment   Sodium 134 (*) 135 - 145 (mEq/L)    Potassium 4.2  3.5 - 5.1 (mEq/L)    Chloride 99  96 - 112 (mEq/L)    CO2 24  19 - 32 (mEq/L)    Glucose, Bld 205 (*) 70 - 99  (mg/dL)    BUN 14  6 - 23 (mg/dL)    Creatinine, Ser 0.90  0.50 - 1.35 (mg/dL)    Calcium 9.7  8.4 - 10.5 (mg/dL)    GFR calc non Af Amer 78 (*) >90 (mL/min)    GFR calc Af Amer >90  >90 (mL/min)   CARDIAC PANEL(CRET KIN+CKTOT+MB+TROPI)     Status: Abnormal   Collection Time   12/15/11  3:20 PM      Component Value Range Comment   Total CK 520 (*) 7 - 232 (U/L)    CK, MB 13.2 (*) 0.3 - 4.0 (ng/mL)    Troponin I <0.30  <0.30 (ng/mL)    Relative Index 2.5  0.0 - 2.5    PROTIME-INR     Status: Normal   Collection Time   12/15/11  3:20 PM      Component Value Range Comment   Prothrombin Time 12.8  11.6 - 15.2 (seconds)    INR 0.94  0.00 - 1.49    CARDIAC PANEL(CRET KIN+CKTOT+MB+TROPI)     Status: Abnormal   Collection Time   12/15/11  9:02 PM      Component Value Range Comment   Total CK 437 (*) 7 - 232 (U/L)    CK, MB 10.8 (*) 0.3 - 4.0 (ng/mL)    Troponin I <0.30  <0.30 (ng/mL)    Relative Index 2.5  0.0 - 2.5    GLUCOSE, CAPILLARY     Status: Abnormal   Collection Time   12/15/11  9:53 PM      Component Value Range Comment   Glucose-Capillary 191 (*) 70 - 99 (mg/dL)    Comment 1 Notify RN      Comment 2 Documented in Chart       CARDIAC PANEL(CRET KIN+CKTOT+MB+TROPI)     Status: Abnormal   Collection Time   12/16/11  3:25 AM      Component Value Range Comment   Total CK 371 (*) 7 - 232 (U/L)    CK, MB 10.2 (*) 0.3 - 4.0 (ng/mL)    Troponin I <0.30  <0.30 (ng/mL)    Relative Index 2.7 (*) 0.0 - 2.5    GLUCOSE, CAPILLARY     Status: Abnormal   Collection Time   12/16/11  7:12 AM      Component Value Range Comment   Glucose-Capillary 176 (*) 70 - 99 (mg/dL)    Comment 1 Notify RN     HEMOGLOBIN A1C     Status: Abnormal   Collection Time   12/16/11  9:21 AM      Component Value Range Comment   Hemoglobin A1C 7.3 (*) <5.7 (%)    Mean Plasma Glucose 163 (*) <117 (mg/dL)   GLUCOSE, CAPILLARY     Status: Abnormal   Collection Time   12/16/11 11:09 AM      Component Value Range Comment     Glucose-Capillary 200 (*) 70 - 99 (mg/dL)    Comment 1 Notify RN     GLUCOSE, CAPILLARY     Status: Abnormal   Collection Time   12/16/11  4:34 PM      Component Value Range Comment   Glucose-Capillary 171 (*) 70 - 99 (mg/dL)    Comment 1 Documented in Chart      Comment 2 Notify RN     GLUCOSE, CAPILLARY     Status: Abnormal   Collection Time   12/16/11  9:02 PM      Component Value Range Comment   Glucose-Capillary 118 (*) 70 - 99 (mg/dL)    Comment 1 Documented in Chart      Comment 2 Notify RN     GLUCOSE, CAPILLARY     Status: Abnormal   Collection Time   12/16/11 11:58 PM      Component Value Range Comment   Glucose-Capillary 116 (*) 70 - 99 (mg/dL)    Comment 1 Documented in Chart      Comment 2 Notify RN     GLUCOSE, CAPILLARY     Status: Abnormal   Collection Time   12/17/11  4:18 AM      Component Value Range Comment   Glucose-Capillary 143 (*) 70 - 99 (mg/dL)    Comment 1 Documented in Chart      Comment 2 Notify RN     GLUCOSE, CAPILLARY     Status: Abnormal   Collection Time   12/17/11  7:11 AM      Component Value Range Comment   Glucose-Capillary 164 (*) 70 - 99 (mg/dL)    Comment 1 Notify RN       Diagnostics:  Dg Chest 2 View  12/15/2011  *RADIOLOGY REPORT*  Clinical Data: Chest pain, weakness  CHEST - 2 VIEW  Comparison: 11/01/2011  Findings: Previous CABG.  Lungs are clear.  Heart size normal.  No effusion.  Minimal spondylitic change in the mid and lower thoracic spine.  Atheromatous aorta.  IMPRESSION:  1.  No acute disease post CABG.  Original Report Authenticated By: D. DANIEL HASSELL III, M.D.   EKG: EKG NSR with inferior T wave inversions (old)   Full Code   Hospital Course: See H&P for complete admission details. The patient is a pleasant 82-year-old white male with history of heart   disease and recent stents who presented with indigestion nausea diaphoresis weakness and dizziness. His symptoms felt similar to previous episodes of MI. In the emergency  room, he had negative troponin, slightly elevated CPK and CPK-MB. EKG actually showed improved ST changes compared to previous, as well as old inferior T wave inversions. The lobar cardiologist on call was contacted by the ED physician.  Dr. Nishan and recommended observation at [facility] for rule out MI. Patient had serially negative troponins. No return of his symptoms. Cardiology was consulted and recommended increasing his protonix twice a day and close outpatient followup. He is cleared for discharge. Family called social worker and requested assisted living, but patient declines. He has however agreed to a nurse, aid, and social worker to visit the home.  Discharge Exam:  Blood pressure 167/76, pulse 75, temperature 98.1 F (36.7 C), temperature source Oral, resp. rate 18, height 5' 10" (1.778 m), weight 80.196 kg (176 lb 12.8 oz), SpO2 95.00%.  Exam unchanged from 12/16/2011  Signed: Shermon Bozzi L 12/17/2011, 10:47 AM   

## 2011-12-21 ENCOUNTER — Encounter (HOSPITAL_COMMUNITY): Payer: Self-pay | Admitting: Emergency Medicine

## 2011-12-21 ENCOUNTER — Emergency Department (HOSPITAL_COMMUNITY): Payer: Medicare Other

## 2011-12-21 ENCOUNTER — Emergency Department (HOSPITAL_COMMUNITY)
Admission: EM | Admit: 2011-12-21 | Discharge: 2011-12-21 | Disposition: A | Discharge status: Home / Self Care | Payer: Medicare Other | Attending: Emergency Medicine | Admitting: Emergency Medicine

## 2011-12-21 DIAGNOSIS — Z8673 Personal history of transient ischemic attack (TIA), and cerebral infarction without residual deficits: Secondary | ICD-10-CM | POA: Insufficient documentation

## 2011-12-21 DIAGNOSIS — R110 Nausea: Secondary | ICD-10-CM | POA: Insufficient documentation

## 2011-12-21 DIAGNOSIS — Z951 Presence of aortocoronary bypass graft: Secondary | ICD-10-CM | POA: Insufficient documentation

## 2011-12-21 DIAGNOSIS — I10 Essential (primary) hypertension: Secondary | ICD-10-CM | POA: Insufficient documentation

## 2011-12-21 DIAGNOSIS — E119 Type 2 diabetes mellitus without complications: Secondary | ICD-10-CM | POA: Insufficient documentation

## 2011-12-21 DIAGNOSIS — X30XXXA Exposure to excessive natural heat, initial encounter: Secondary | ICD-10-CM | POA: Insufficient documentation

## 2011-12-21 DIAGNOSIS — R0789 Other chest pain: Secondary | ICD-10-CM | POA: Insufficient documentation

## 2011-12-21 DIAGNOSIS — R5381 Other malaise: Secondary | ICD-10-CM | POA: Insufficient documentation

## 2011-12-21 DIAGNOSIS — T675XXA Heat exhaustion, unspecified, initial encounter: Secondary | ICD-10-CM

## 2011-12-21 DIAGNOSIS — K219 Gastro-esophageal reflux disease without esophagitis: Secondary | ICD-10-CM | POA: Insufficient documentation

## 2011-12-21 DIAGNOSIS — I2510 Atherosclerotic heart disease of native coronary artery without angina pectoris: Secondary | ICD-10-CM | POA: Insufficient documentation

## 2011-12-21 LAB — BASIC METABOLIC PANEL
BUN: 16 mg/dL (ref 6–23)
Creatinine, Ser: 0.91 mg/dL (ref 0.50–1.35)
GFR calc Af Amer: >90 mL/min (ref >90)
GFR calc non Af Amer: 78 mL/min — ABNORMAL LOW (ref >90)
Glucose, Bld: 145 mg/dL — ABNORMAL HIGH (ref 70–99)

## 2011-12-21 LAB — DIFFERENTIAL
Basophils Absolute: 0 10*3/uL (ref 0.0–0.1)
Basophils Relative: 0 % (ref 0–1)
Eosinophils Relative: 7 % — ABNORMAL HIGH (ref 0–5)
Monocytes Absolute: 0.6 10*3/uL (ref 0.1–1.0)

## 2011-12-21 LAB — CBC
HCT: 33 % — ABNORMAL LOW (ref 39.0–52.0)
MCH: 30.5 pg (ref 26.0–34.0)
MCHC: 33.6 g/dL (ref 30.0–36.0)
MCV: 90.7 fL (ref 78.0–100.0)
RDW: 13.5 % (ref 11.5–15.5)

## 2011-12-21 MED ORDER — SODIUM CHLORIDE 0.9 % IV BOLUS (SEPSIS)
500.0000 mL | Freq: Once | INTRAVENOUS | Status: AC
  Administered 2011-12-21: 500 mL via INTRAVENOUS

## 2011-12-21 NOTE — ED Notes (Signed)
Discharge instructions given and reviewed with patient.  Patient verbalized understanding to drink plenty of fluids and to follow up with PMD as needed.  Patient discharged home in good condition via wheelchair.  Daughter accompanied discharge.

## 2011-12-21 NOTE — ED Provider Notes (Signed)
History  This chart was scribed for Jeffrey Lennert, MD by Cherlynn Perches. The patient was seen in room APA18/APA18. Patient's care was started at 1904.  CSN: [id number]  Arrival date & time 12/21/11  1904   First MD Initiated Contact with Patient 12/21/11 1915      Chief Complaint  Patient presents with  . Heat Exposure    (Consider location/radiation/quality/duration/timing/severity/associated sxs/prior treatment) Patient is a 78 y.o. male presenting with weakness. The history is provided by the patient. No language interpreter was used.  Weakness The primary symptoms include nausea. Primary symptoms do not include headaches, syncope, seizures, fever or vomiting. The symptoms began 2 to 6 hours ago. The symptoms are improving. The neurological symptoms are diffuse. The symptoms occurred on exertion (in heat).  Nausea began today.  Additional symptoms include weakness. Additional symptoms do not include hallucinations.    JOHNNEY [name] is a 78 y.o. male with a h/o diabetes, CAD, HTN, MI, and GERD who presents to the Emergency Department complaining of gradual onset, gradually improving, constant, generalized weakness with associated nausea, diaphoresis, and one episode of chest tightness. Pt states that he was working all day in 90 degree heat. Pt reports that after a while, he went inside his house, turned on the [facility], and he was still sweating badly. Pt states that he had one episode of chest tightness when outside. Pt reports that his condition is improving and he is not currently experiencing any pain or nausea. Pt denies SOB, vomiting, and diarrhea. Pt is a former smoker and denies alcohol use.    Past Medical History  Diagnosis Date  . Diabetes mellitus   . Hypercholesterolemia   . Coronary artery disease     a.  s/p CABG x 3 1989, VG->LCX, VG->RCA, LIMA->LAD;  b. 05/24/10 Cath/PCI: 3vd, LIMA patent, VG->RCA occluded, VG->LCX 90 in graft (3.0x15 promus) & 90 in distal LCX  (2.75x15 Promus);  c. NSTEMI in 04/2011 - Cath occlusion of distal LCX stent.   . Coronary artery disease     a.  s/p CABG 1999;  b. s/p stenting to distal LCx, s/p stent to proximal SVG-LCx in 05/2010;  c. NSTEMI in 04/2011 with cath showing occluded OM2 graft   . Diabetes mellitus   . Hypertension   . PUD (peptic ulcer disease)   . Hypercholesteremia   . Myocardial infarct, old   . GERD (gastroesophageal reflux disease)   . Arthritis   . Osteoarthritis   . Stroke   . Pneumonia     Past Surgical History  Procedure Date  . Coronary angioplasty with stent placement   . Tonsillectomy   . Post aortocoronary bypass surgery   . Coronary artery bypass graft   . Coronary stent placement   . Coronary angioplasty     No family history on file.  History  Substance Use Topics  . Smoking status: Former Smoker    Types: Cigarettes  . Smokeless tobacco: Current User    Types: Chew  . Alcohol Use: No      Review of Systems  Constitutional: Negative for fever and fatigue.  HENT: Negative for congestion, sinus pressure and ear discharge.   Eyes: Negative for discharge.  Respiratory: Positive for chest tightness. Negative for cough.   Cardiovascular: Negative for chest pain and syncope.  Gastrointestinal: Positive for nausea. Negative for vomiting, abdominal pain and diarrhea.  Genitourinary: Negative for frequency and hematuria.  Musculoskeletal: Negative for back pain.  Skin: Negative for rash.  Neurological:  Positive for weakness. Negative for seizures and headaches.  Hematological: Negative.   Psychiatric/Behavioral: Negative for hallucinations.  All other systems reviewed and are negative.    Allergies  Review of patient's allergies indicates no known allergies.  Home Medications   Current Outpatient Rx  Name Route Sig Dispense Refill  . ASPIRIN 325 MG PO TABS Oral Take 325 mg by mouth daily.    . ASPIRIN EFFERVESCENT 325 MG PO TBEF Oral Take 325 mg by mouth daily as  needed. For heartburn    . PEPTO-BISMOL PO Oral Take by mouth daily as needed. Patient states that he just takes a couple of swallows when needed to control bowels.     Marland Kitchen CALCIUM CARBONATE ANTACID 500 MG PO CHEW Oral Chew 2 tablets by mouth daily as needed.    Marland Kitchen CARVEDILOL 6.25 MG PO TABS Oral Take 6.25 mg by mouth 2 (two) times daily with a meal.    . COD LIVER OIL PO Oral Take 1 capsule by mouth daily.      Marland Kitchen DOCUSATE SODIUM 100 MG PO TABS Oral Take 200-300 mg by mouth at bedtime. Constipation    . ISOSORBIDE MONONITRATE ER 30 MG PO TB24 Oral Take 1 tablet (30 mg total) by mouth daily. 30 tablet 6    Dose increase  . LORATADINE 10 MG PO TABS Oral Take 10 mg by mouth daily.      Marland Kitchen MECLIZINE HCL 25 MG PO TABS Oral Take 25 mg by mouth daily as needed. For dizziness    . METFORMIN HCL 500 MG PO TABS Oral Take 500 mg by mouth 2 (two) times daily.     Marland Kitchen NITROGLYCERIN 0.4 MG SL SUBL Sublingual Place 1 tablet (0.4 mg total) under the tongue every 5 (five) minutes x 3 doses as needed for chest pain. 10 tablet 0  . PANTOPRAZOLE SODIUM 40 MG PO TBEC Oral Take 1 tablet (40 mg total) by mouth 2 (two) times daily.    Marland Kitchen RAMIPRIL 5 MG PO CAPS Oral Take 5 mg by mouth daily.      Marland Kitchen ROSUVASTATIN CALCIUM 20 MG PO TABS Oral Take 1 tablet (20 mg total) by mouth at bedtime. 30 tablet 2  . SAXAGLIPTIN HCL 5 MG PO TABS Oral Take 5 mg by mouth at bedtime.     Marland Kitchen TICAGRELOR 90 MG PO TABS Oral Take 1 tablet (90 mg total) by mouth 2 (two) times daily. 60 tablet 0  . TRAMADOL HCL 50 MG PO TABS Oral Take 50 mg by mouth every 6 (six) hours as needed. FOR PAIN      BP 149/77  Pulse 77  Temp(Src) 98.3 F (36.8 C) (Oral)  Resp 18  Ht 5\' 10"  (1.778 m)  Wt 180 lb (81.647 kg)  BMI 25.83 kg/m2  SpO2 98%  Physical Exam  Nursing note and vitals reviewed. Constitutional: He is oriented to person, place, and time. He appears well-developed.  HENT:  Head: Normocephalic and atraumatic.       Mucous membranes dry  Eyes:  Conjunctivae and EOM are normal. No scleral icterus.  Neck: Neck supple. No thyromegaly present.  Cardiovascular: Normal rate and regular rhythm.  Exam reveals no gallop and no friction rub.   No murmur heard. Pulmonary/Chest: No stridor. He has no wheezes. He has no rales. He exhibits no tenderness.  Abdominal: He exhibits no distension. There is no tenderness. There is no rebound.  Musculoskeletal: Normal range of motion. He exhibits no edema.  Lymphadenopathy:  He has no cervical adenopathy.  Neurological: He is oriented to person, place, and time. Coordination normal.  Skin: No rash noted. No erythema.  Psychiatric: He has a normal mood and affect. His behavior is normal.    ED Course  Procedures (including critical care time)  DIAGNOSTIC STUDIES: Oxygen Saturation is 98% on room air, normal by my interpretation.    COORDINATION OF CARE: 7:20 PM - Patient understands and agrees with initial ED impression and plan with expectations set for ED visit.     Labs Reviewed - No data to display No results found.   No diagnosis found.  Date: 12/21/2011  Rate: 76  Rhythm: normal sinus rhythm  QRS Axis: normal  Intervals: normal  ST/T Wave abnormalities: nonspecific ST changes  Conduction Disutrbances:none  Narrative Interpretation:   Old EKG Reviewed: unchanged    Pt felt much better at discharge MDM        The chart was scribed for me under my direct supervision.  I personally performed the history, physical, and medical decision making and all procedures in the evaluation of this patient.Jeffrey Lennert, MD 12/21/11 2135

## 2011-12-21 NOTE — ED Notes (Signed)
Patient has been out in heat all day today; states feels dehydrated and weak.  Patient ambulatory to stretcher from doorway.  States has been sweating; denies chest pain.

## 2011-12-21 NOTE — Discharge Instructions (Signed)
Drink plenty of fluids.  Follow up with your md if any problems 

## 2011-12-21 NOTE — ED Notes (Signed)
Patient sitting on side of bed; states he feels better and wants to go home.  Dr. Estell [name] at bedside to reassess.

## 2011-12-21 NOTE — ED Notes (Signed)
Patient presents to ER via EMS with c/o heat exposure.  Patient states has been working in the heat today and got hot and sweaty.  Patient states went inside and his air was not on; states was drinking Diet Sundrop.  Patient denies any pain, nausea or dizziness.  A&O; skin w/d. Respirations even and unlabored; able to speak in complete sentences without difficulty.  CBG result per EMS 177.  IV established and NS bolus given by EMS.  Patient states he feels much better after receiving fluid.

## 2011-12-21 NOTE — ED Notes (Signed)
Patient resting comfortably in bed; remains A&O; skin w/d. Respirations even and unlabored.  Continues to deny chest pain, nausea or any other complaints at this time.

## 2011-12-24 ENCOUNTER — Encounter: Payer: Self-pay | Admitting: Adult Health

## 2011-12-24 ENCOUNTER — Ambulatory Visit (INDEPENDENT_AMBULATORY_CARE_PROVIDER_SITE_OTHER): Payer: Medicare Other | Admitting: Adult Health

## 2011-12-24 VITALS — BP 97/54 | HR 96 | Resp 18 | Ht 70.0 in | Wt 175.0 lb

## 2011-12-24 DIAGNOSIS — R0600 Dyspnea, unspecified: Secondary | ICD-10-CM | POA: Insufficient documentation

## 2011-12-24 DIAGNOSIS — Z951 Presence of aortocoronary bypass graft: Secondary | ICD-10-CM

## 2011-12-24 DIAGNOSIS — R0609 Other forms of dyspnea: Secondary | ICD-10-CM

## 2011-12-24 MED ORDER — ALBUTEROL SULFATE HFA 108 (90 BASE) MCG/ACT IN AERS: INHALATION_SPRAY | RESPIRATORY_TRACT | Status: DC

## 2011-12-24 NOTE — Patient Instructions (Addendum)
**Note De-Identified  Obfuscation** Your physician has recommended you make the following change in your medication: start using Proventil hand held inhaler (only take 1 to 2 puffs on a as needed basis) all other medications remain the same.  DO NOT USE PROVENTIL INHALER ON THE DAY OF YOUR PFT's  Your physician has recommended that you have a pulmonary function test. Pulmonary Function Tests are a group of tests that measure how well air moves in and out of your lungs.  Your physician recommends that you schedule a follow-up appointment in: 1 month

## 2011-12-24 NOTE — Assessment & Plan Note (Signed)
Continue medical management at this time as I think the chest discomfort is more related to lung issues than cardiac etiology. If PFT's are normal, can place him on low dose nitrate. Ranexa would not be a good choice secondary to history of CHF.

## 2011-12-24 NOTE — Assessment & Plan Note (Signed)
He is easily short of breath and states he gets chest discomfort with minimal exertion or stress. I have scheduled him for PFT's to evaluate him further. In the interim, I have put him back on his proventil inhaler as he was taking in the past and not had for a while. I think some of this is anxiety related as well. He will need to have a low dose anti anxiety medication provided by PCP if this is helpful to him. I will not prescribe this.

## 2011-12-24 NOTE — Progress Notes (Addendum)
HPI: [name] is a 78 y/o patient of Dr. Dietrich Pates who we are seeing on hospital follow-up. He has been to the ER once since being seen in May. He is a very anxious patient with history of acute on chronic CHF, atrial flutter,  COPD, iron deficiency anemia, carotid artery disease, and tobacco abuse.  Marland KitchenHe has EF of 45%. He was diuresed and is feeling and breathing better, but still suffers from DOE. He uses a Vicks inhaler at times when he is short of breath. He is also to have cataract surgery in early July.   No Known Allergies  Current Outpatient Prescriptions  Medication Sig Dispense Refill  . aspirin 325 MG tablet Take 325 mg by mouth daily.      Marland Kitchen aspirin-sod bicarb-citric acid (ALKA-SELTZER) 325 MG TBEF Take 325 mg by mouth daily as needed. For heartburn      . Bismuth Subsalicylate (PEPTO-BISMOL PO) Take by mouth daily as needed. Patient states that he just takes a couple of swallows when needed to control bowels.       . calcium carbonate (TUMS - DOSED IN MG ELEMENTAL CALCIUM) 500 MG chewable tablet Chew 2 tablets by mouth daily as needed.      . carvedilol (COREG) 6.25 MG tablet Take 6.25 mg by mouth 2 (two) times daily with a meal.      . COD LIVER OIL PO Take 1 capsule by mouth daily.        Tery Sanfilippo Sodium (STOOL SOFTENER) 100 MG capsule Take 200-300 mg by mouth at bedtime. Constipation      . isosorbide mononitrate (IMDUR) 30 MG 24 hr tablet Take 1 tablet (30 mg total) by mouth daily.  30 tablet  6  . loratadine (CLARITIN) 10 MG tablet Take 10 mg by mouth daily.        . meclizine (ANTIVERT) 25 MG tablet Take 25 mg by mouth daily as needed. For dizziness      . metFORMIN (GLUCOPHAGE) 500 MG tablet Take 500 mg by mouth 2 (two) times daily.       . nitroGLYCERIN (NITROSTAT) 0.4 MG SL tablet Place 1 tablet (0.4 mg total) under the tongue every 5 (five) minutes x 3 doses as needed for chest pain.  10 tablet  0  . pantoprazole (PROTONIX) 40 MG tablet Take 1 tablet (40 mg total) by  mouth 2 (two) times daily.      . ramipril (ALTACE) 5 MG capsule Take 5 mg by mouth daily.        . rosuvastatin (CRESTOR) 20 MG tablet Take 1 tablet (20 mg total) by mouth at bedtime.  30 tablet  2  . saxagliptin HCl (ONGLYZA) 5 MG TABS tablet Take 5 mg by mouth at bedtime.       . Ticagrelor (BRILINTA) 90 MG TABS tablet Take 1 tablet (90 mg total) by mouth 2 (two) times daily.  60 tablet  0  . traMADol (ULTRAM) 50 MG tablet Take 50 mg by mouth every 6 (six) hours as needed. FOR PAIN      . albuterol (VENTOLIN HFA) 108 (90 BASE) MCG/ACT inhaler 1 to 2 puffs as needed  6.7 g  1    Past Medical History  Diagnosis Date  . Diabetes mellitus   . Hypercholesterolemia   . Coronary artery disease     a.  s/p CABG x 3 1989, VG->LCX, VG->RCA, LIMA->LAD;  b. 05/24/10 Cath/PCI: 3vd, LIMA patent, VG->RCA occluded, VG->LCX 90 in graft (3.0x15  promus) & 90 in distal LCX (2.75x15 Promus);  c. NSTEMI in 04/2011 - Cath occlusion of distal LCX stent.   . Coronary artery disease     a.  s/p CABG 1999;  b. s/p stenting to distal LCx, s/p stent to proximal SVG-LCx in 05/2010;  c. NSTEMI in 04/2011 with cath showing occluded OM2 graft   . Diabetes mellitus   . Hypertension   . PUD (peptic ulcer disease)   . Hypercholesteremia   . Myocardial infarct, old   . GERD (gastroesophageal reflux disease)   . Arthritis   . Osteoarthritis   . Stroke   . Pneumonia     Past Surgical History  Procedure Date  . Coronary angioplasty with stent placement   . Tonsillectomy   . Post aortocoronary bypass surgery   . Coronary artery bypass graft   . Coronary stent placement   . Coronary angioplasty     ROS: PHYSICAL EXAM BP 97/54  Pulse 96  Resp 18  Ht 5\' 10"  (1.778 m)  Wt 175 lb (79.379 kg)  BMI 25.11 kg/m2  General: Well developed, well nourished, in no acute distress Head: Eyes PERRLA, No xanthomas.   Normal cephalic and atramatic  Lungs: Expiratory high pitched rales, with frequent cough Heart: HRRR S1  S2, without MRG.  Pulses are 2+ & equal.            No carotid bruit. No JVD.  No abdominal bruits. No femoral bruits. Abdomen: Bowel sounds are positive, abdomen soft and non-tender without masses or                  Hernia's noted. Msk:  Back normal, normal gait. Normal strength and tone for age. Extremities: No clubbing, cyanosis or edema.  DP +1 Neuro: Alert and oriented X 3. Psych:  Good affect, responds appropriately    ASSESSMENT AND PLAN

## 2011-12-25 ENCOUNTER — Telehealth: Payer: Self-pay | Admitting: Adult Health

## 2011-12-25 NOTE — Telephone Encounter (Signed)
**Note De-Identified  Obfuscation** Pt. walked into office on 08-04-11 and stated that he was out of Isosorbide b/c he was taking 30 mg daily and was instructed to only take 1/2 tab (15 mg) daily. At that time, Joni Reining, NP increased the dose to 30 mg daily as pt. had been taking at that dose for at least a month without any problems. Today pt. walked into office and stated that he has been taking 30 mg of Isosorbide bid and is out and pharmacy will not fill. [facility] has also called office concerning this matter. Please advise./LV  FYI: Pt. had OV with Joni Reining, NP yesterday (6-12) and pt. stated he was only taking Isosorbide 30 mg once daily.

## 2011-12-25 NOTE — Telephone Encounter (Signed)
Send him a Rx refill authorization for 30 mg daily. He can stop taking it for a little while if he cannot get it refilled. Called a "nitrate holiday". He should only take it once a day.

## 2011-12-25 NOTE — Telephone Encounter (Signed)
PT IS OUT OF ISOSORBIDE, HE STATES HE HAS BEEN TAKING IT TWICE A DAY AND CAN NOT GET A REFILL BECAUSE IT IS TO EARLY.  HE STATES HE KNOW NOW HE IS ONLY TO TAKE 1 A DAY AND DIDN'T REALIZE IT TILL TODAY.

## 2011-12-26 ENCOUNTER — Encounter (HOSPITAL_COMMUNITY): Payer: Self-pay

## 2011-12-26 NOTE — Telephone Encounter (Signed)
**Note De-Identified  Obfuscation** No Answer and no way to leave message. According to pharmacist at [facility], pt's pharmacy, pt. Cant get refill until 6-23 and that if he needs sooner the cost is $21.85

## 2011-12-26 NOTE — Telephone Encounter (Signed)
**Note De-identified  Obfuscation** Pt. advised, he verbalized understanding./LV 

## 2011-12-30 ENCOUNTER — Inpatient Hospital Stay (HOSPITAL_COMMUNITY): Admission: RE | Admit: 2011-12-30 | Payer: Self-pay | Source: Ambulatory Visit

## 2012-01-03 ENCOUNTER — Other Ambulatory Visit: Payer: Self-pay | Admitting: Adult Health

## 2012-01-07 ENCOUNTER — Telehealth: Payer: Self-pay | Admitting: *Deleted

## 2012-01-07 NOTE — Telephone Encounter (Signed)
Received a call from April at [facility] regarding anti platelet therapy.  Pt was in hospital in April and had stent placed, script called in at that time for Brillinta by provider at hospital, however patent did not get filled, as this was not on his discharge instructions, nor did he know he was supposed to start this medication.  At follow up visit in May, Brillinta was listed on his medications, but pt had still not filled and had been taking Plavix 75 mg, as he had been taking previously.  Advised patient to stay on Plavix and not fill Brillinta until further instructed by our office.  Has a follow up with Joni Reining on 7/18.

## 2012-01-08 NOTE — Telephone Encounter (Signed)
Continue Plavix as directed. Will discuss this with him on appointment. Should also be on ASA.

## 2012-01-09 ENCOUNTER — Telehealth: Payer: Self-pay | Admitting: *Deleted

## 2012-01-09 NOTE — Telephone Encounter (Signed)
Received TFC from [facility] to make me aware that patient, was in fact taking Brillinta in addition to Plavix.  Advised her to reiterate to patient that he is only to take Plavix.  She will obtain CBC and hemoccult cards will be mailed to patient to complete.

## 2012-01-20 ENCOUNTER — Ambulatory Visit (INDEPENDENT_AMBULATORY_CARE_PROVIDER_SITE_OTHER): Payer: Medicare Other

## 2012-01-20 DIAGNOSIS — R0989 Other specified symptoms and signs involving the circulatory and respiratory systems: Secondary | ICD-10-CM

## 2012-01-23 ENCOUNTER — Emergency Department (HOSPITAL_COMMUNITY): Payer: Medicare Other

## 2012-01-23 ENCOUNTER — Encounter (HOSPITAL_COMMUNITY): Payer: Self-pay

## 2012-01-23 ENCOUNTER — Observation Stay (HOSPITAL_COMMUNITY)
Admission: EM | Admit: 2012-01-23 | Discharge: 2012-01-24 | Disposition: A | Discharge status: Home / Self Care | Payer: Medicare Other | Attending: Internal Medicine | Admitting: Internal Medicine

## 2012-01-23 DIAGNOSIS — Z951 Presence of aortocoronary bypass graft: Secondary | ICD-10-CM | POA: Insufficient documentation

## 2012-01-23 DIAGNOSIS — R42 Dizziness and giddiness: Principal | ICD-10-CM | POA: Diagnosis present

## 2012-01-23 DIAGNOSIS — I10 Essential (primary) hypertension: Secondary | ICD-10-CM | POA: Insufficient documentation

## 2012-01-23 DIAGNOSIS — E785 Hyperlipidemia, unspecified: Secondary | ICD-10-CM | POA: Diagnosis present

## 2012-01-23 DIAGNOSIS — N400 Enlarged prostate without lower urinary tract symptoms: Secondary | ICD-10-CM | POA: Diagnosis present

## 2012-01-23 DIAGNOSIS — E119 Type 2 diabetes mellitus without complications: Secondary | ICD-10-CM | POA: Insufficient documentation

## 2012-01-23 DIAGNOSIS — R55 Syncope and collapse: Secondary | ICD-10-CM | POA: Diagnosis present

## 2012-01-23 DIAGNOSIS — E1165 Type 2 diabetes mellitus with hyperglycemia: Secondary | ICD-10-CM

## 2012-01-23 DIAGNOSIS — I2510 Atherosclerotic heart disease of native coronary artery without angina pectoris: Secondary | ICD-10-CM | POA: Diagnosis present

## 2012-01-23 LAB — URINALYSIS, ROUTINE W REFLEX MICROSCOPIC
Leukocytes, UA: NEGATIVE
Nitrite: NEGATIVE
Specific Gravity, Urine: 1.01 (ref 1.005–1.030)
pH: 7 (ref 5.0–8.0)

## 2012-01-23 LAB — CBC WITH DIFFERENTIAL/PLATELET
Eosinophils Relative: 5 % (ref 0–5)
Lymphocytes Relative: 23 % (ref 12–46)
Lymphs Abs: 1.4 10*3/uL (ref 0.7–4.0)
MCV: 90 fL (ref 78.0–100.0)
Neutrophils Relative %: 55 % (ref 43–77)
Platelets: 153 10*3/uL (ref 150–400)
RBC: 3.7 MIL/uL — ABNORMAL LOW (ref 4.22–5.81)
WBC: 5.9 10*3/uL (ref 4.0–10.5)

## 2012-01-23 LAB — COMPREHENSIVE METABOLIC PANEL
CO2: 26 mEq/L (ref 19–32)
Calcium: 9.5 mg/dL (ref 8.4–10.5)
Creatinine, Ser: 1.01 mg/dL (ref 0.50–1.35)
GFR calc Af Amer: 79 mL/min — ABNORMAL LOW (ref >90)
GFR calc non Af Amer: 68 mL/min — ABNORMAL LOW (ref >90)
Glucose, Bld: 170 mg/dL — ABNORMAL HIGH (ref 70–99)

## 2012-01-23 LAB — TROPONIN I: Troponin I: <0.3 ng/mL (ref <0.30)

## 2012-01-23 MED ORDER — ATORVASTATIN CALCIUM 20 MG PO TABS: 20.0000 mg | ORAL_TABLET | Freq: Every day | ORAL | Status: DC

## 2012-01-23 MED ORDER — DOCUSATE SODIUM 100 MG PO CAPS: 100.0000 mg | ORAL_CAPSULE | Freq: Every day | ORAL | Status: DC

## 2012-01-23 MED ORDER — CARVEDILOL 3.125 MG PO TABS
6.2500 mg | ORAL_TABLET | Freq: Two times a day (BID) | ORAL | Status: DC
  Administered 2012-01-24: 6.25 mg via ORAL
  Filled 2012-01-23: qty 2

## 2012-01-23 MED ORDER — ACETAMINOPHEN 325 MG PO TABS: 650.0000 mg | ORAL_TABLET | ORAL | Status: DC | PRN

## 2012-01-23 MED ORDER — TRAZODONE HCL 50 MG PO TABS: 25.0000 mg | ORAL_TABLET | Freq: Every evening | ORAL | Status: DC | PRN

## 2012-01-23 MED ORDER — CLOPIDOGREL BISULFATE 75 MG PO TABS
75.0000 mg | ORAL_TABLET | Freq: Every day | ORAL | Status: DC
  Administered 2012-01-24: 75 mg via ORAL
  Filled 2012-01-23: qty 1

## 2012-01-23 MED ORDER — FLEET ENEMA 7-19 GM/118ML RE ENEM: 1.0000 | ENEMA | Freq: Once | RECTAL | Status: AC | PRN

## 2012-01-23 MED ORDER — PANTOPRAZOLE SODIUM 40 MG PO TBEC
40.0000 mg | DELAYED_RELEASE_TABLET | Freq: Two times a day (BID) | ORAL | Status: DC
  Administered 2012-01-24: 40 mg via ORAL
  Filled 2012-01-23 (×2): qty 1

## 2012-01-23 MED ORDER — MUPIROCIN CALCIUM 2 % EX CREA
TOPICAL_CREAM | Freq: Three times a day (TID) | CUTANEOUS | Status: DC
  Filled 2012-01-23: qty 15

## 2012-01-23 MED ORDER — BISACODYL 5 MG PO TBEC: 5.0000 mg | DELAYED_RELEASE_TABLET | Freq: Every day | ORAL | Status: DC | PRN

## 2012-01-23 MED ORDER — ASPIRIN EC 81 MG PO TBEC: 81.0000 mg | DELAYED_RELEASE_TABLET | Freq: Every day | ORAL | Status: DC

## 2012-01-23 MED ORDER — SODIUM CHLORIDE 0.9 % IJ SOLN
3.0000 mL | Freq: Two times a day (BID) | INTRAMUSCULAR | Status: DC
  Filled 2012-01-23: qty 3

## 2012-01-23 MED ORDER — ONDANSETRON HCL 4 MG/2ML IJ SOLN: 4.0000 mg | INTRAMUSCULAR | Status: DC | PRN

## 2012-01-23 MED ORDER — NITROGLYCERIN 0.4 MG SL SUBL: 0.4000 mg | SUBLINGUAL_TABLET | SUBLINGUAL | Status: DC | PRN

## 2012-01-23 MED ORDER — TAMSULOSIN HCL 0.4 MG PO CAPS
0.4000 mg | ORAL_CAPSULE | Freq: Every day | ORAL | Status: DC
Start: 2012-01-24 — End: 2012-01-24

## 2012-01-23 MED ORDER — ISOSORBIDE MONONITRATE ER 60 MG PO TB24
30.0000 mg | ORAL_TABLET | Freq: Every day | ORAL | Status: DC
  Administered 2012-01-24: 30 mg via ORAL
  Filled 2012-01-23: qty 1

## 2012-01-23 MED ORDER — TRAMADOL HCL 50 MG PO TABS: 50.0000 mg | ORAL_TABLET | Freq: Four times a day (QID) | ORAL | Status: DC | PRN

## 2012-01-23 MED ORDER — METFORMIN HCL 500 MG PO TABS
500.0000 mg | ORAL_TABLET | Freq: Two times a day (BID) | ORAL | Status: DC
  Administered 2012-01-24: 500 mg via ORAL
  Filled 2012-01-23: qty 1

## 2012-01-23 MED ORDER — LORATADINE 10 MG PO TABS
10.0000 mg | ORAL_TABLET | Freq: Every day | ORAL | Status: DC
  Administered 2012-01-24: 10 mg via ORAL
  Filled 2012-01-23: qty 1

## 2012-01-23 MED ORDER — ASPIRIN 325 MG PO TABS
325.0000 mg | ORAL_TABLET | Freq: Every day | ORAL | Status: DC
  Administered 2012-01-24: 325 mg via ORAL
  Filled 2012-01-23: qty 1

## 2012-01-23 MED ORDER — RAMIPRIL 2.5 MG PO CAPS
5.0000 mg | ORAL_CAPSULE | Freq: Every day | ORAL | Status: DC
  Administered 2012-01-24: 5 mg via ORAL
  Filled 2012-01-23: qty 2
  Filled 2012-01-23: qty 1

## 2012-01-23 MED ORDER — POTASSIUM CHLORIDE IN NACL 20-0.9 MEQ/L-% IV SOLN
INTRAVENOUS | Status: DC
  Administered 2012-01-24: 50 mL/h via INTRAVENOUS

## 2012-01-23 MED ORDER — ENOXAPARIN SODIUM 40 MG/0.4ML ~~LOC~~ SOLN
40.0000 mg | SUBCUTANEOUS | Status: DC
  Administered 2012-01-24: 40 mg via SUBCUTANEOUS
  Filled 2012-01-23: qty 0.4

## 2012-01-23 NOTE — ED Notes (Signed)
Pt states he ate pork and beans for supper and took all his meds. States after that he became dizzy

## 2012-01-23 NOTE — H&P (Signed)
[facility] History and Physical  Jeffrey [name]:[id number] DOB: 1928/11/14 DOA: 01/23/2012   PCP:   [name], MD   Chief Complaint:  Dizziness today  HPI: Jeffrey [name] is an 78 y.o. male.   Elderly Caucasian gentleman, multiple medical problems, including coronary artery disease, diabetes hypertension hyperlipidemia and peptic ulcer disease, [place], recurrent visits to the hospital for concerns about having an acute MI.   reports that he felt an episode of severe dizziness after eating a meal of pork and beans, [place] blood pressure and found the systolic in the 180s, took his blood pressure medications and felt worse, elderly who was pass out it was associated with diaphoresis and nausea and shortness of breath. No frank chest pains but the symptoms seem to be similar when he was having his last heart attack. He called EMS and was brought to the emergency room.  Denies headache or trauma; denies fever cough or cold; denies diarrhea or black or bloody stool.  Has been having a lesion on his back which has been itching and he has been scratching it no history of trauma to the area.   Blood sugars and usually below 190.  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    Past Medical History  Diagnosis Date  . Diabetes mellitus   . Hypercholesterolemia   . Coronary artery disease     a.  s/p CABG x 3 1989,  VG->LCX, VG->RCA, LIMA->LAD;  b. 05/24/10 Cath/PCI: 3vd, LIMA patent, VG->RCA occluded, VG->LCX 90 in graft (3.0x15 promus) & 90 in distal LCX (2.75x15 Promus);  c. NSTEMI in 04/2011 - Cath occlusion of distal LCX stent.   . Coronary artery disease     a.  s/p CABG 1999;  b. s/p stenting to distal LCx, s/p stent to proximal SVG-LCx in 05/2010;  c. NSTEMI in 04/2011 with cath showing occluded OM2 graft   . Diabetes mellitus   . Hypertension   . PUD (peptic ulcer disease)   . Hypercholesteremia   . Myocardial infarct, old   . GERD (gastroesophageal reflux disease)   . Arthritis   . Osteoarthritis   . Stroke   . Pneumonia     Past Surgical History  Procedure Date  . Coronary angioplasty with stent placement   . Tonsillectomy   . Post aortocoronary bypass surgery   . Coronary artery bypass graft   . Coronary stent placement   . Coronary angioplasty     Medications:  HOME MEDS: Prior to Admission medications   Medication Sig Start Date End Date Taking? Authorizing Provider  albuterol (PROAIR HFA) 108 (90 BASE) MCG/ACT inhaler Inhale 1-2 puffs into the lungs every 6 (six) hours as needed.   Yes Historical Provider, MD  aspirin 325 MG tablet Take 325 mg by mouth daily.   Yes Historical Provider, MD  aspirin-sod bicarb-citric acid (ALKA-SELTZER) 325 MG TBEF Take 325 mg by mouth daily as needed. For heartburn   Yes Historical Provider, MD  B Complex-Biotin-FA (B-COMPLEX PO) Take 1 tablet by mouth daily.   Yes Historical Provider, MD  Bismuth Subsalicylate (PEPTO-BISMOL PO) Take by mouth daily as needed. Patient states that he just takes a couple of swallows when needed to control bowels.    Yes Historical Provider, MD  calcium carbonate (TUMS - DOSED IN MG ELEMENTAL CALCIUM) 500 MG chewable tablet Chew 2 tablets by mouth daily as needed.   Yes Historical Provider, MD  carvedilol (COREG) 6.25 MG tablet Take 6.25 mg by mouth 2 (two) times daily with a meal. For blood pressure/heart   Yes  Historical Provider, MD  clopidogrel (PLAVIX) 75 MG tablet Take 75 mg by mouth daily. Anti-Stroke   Yes Historical Provider, MD  Docusate Sodium (STOOL SOFTENER) 100 MG capsule Take 100 mg by mouth at bedtime. Constipation   Yes Historical Provider, MD  isosorbide mononitrate (IMDUR) 30 MG 24 hr tablet Take 30 mg by mouth daily. For heart   Yes Historical Provider, MD  metFORMIN (GLUCOPHAGE) 500 MG tablet Take 500 mg by mouth 2 (two) times daily.   Yes Historical Provider, MD  Multiple Vitamin (MULTIVITAMIN WITH MINERALS) TABS Take 1 tablet by mouth daily.   Yes Historical Provider, MD  pantoprazole (PROTONIX) 40 MG tablet Take 1 tablet (40 mg total) by mouth 2 (two) times daily. 12/17/11  Yes Christiane Ha, MD  ramipril (ALTACE) 5 MG capsule Take 5 mg by mouth daily. For blood pressure   Yes Historical Provider, MD  rosuvastatin (CRESTOR) 10 MG tablet Take 10 mg by mouth daily.   Yes Historical Provider, MD  Tamsulosin HCl (FLOMAX) 0.4 MG CAPS Take 0.4 mg by mouth at bedtime. For urinary health   Yes Historical Provider, MD  traMADol (ULTRAM) 50 MG tablet Take 50 mg by mouth every 8 (eight) hours as needed.   Yes Historical Provider, MD  nitroGLYCERIN (NITROSTAT) 0.4 MG SL tablet Place 1 tablet (0.4 mg total) under the tongue every 5 (five) minutes x 3 doses as needed for chest pain. 10/27/11 10/26/12  Jeffrey Ranga, MD  saxagliptin HCl (ONGLYZA) 5 MG TABS tablet Take 5 mg by mouth at bedtime.     Historical Provider, MD     Allergies:  No Known Allergies  Social History:   reports that he has quit smoking. His smoking use included Cigarettes. His smokeless tobacco use includes Chew. He reports that he does not drink alcohol or use illicit drugs.  Family History: No family history on file. Does not know his family medical history    Physical Exam: Filed Vitals:   01/23/12 1942 01/23/12 1945 01/23/12 2153 01/23/12 2200  BP: 137/86 143/74 162/75 130/100  Pulse: 80 78 73 72  Temp:        TempSrc:      Resp: 20 20 18 18   Height:      Weight:      SpO2: 96% 96% 97% 96%   Blood pressure 130/100, pulse 72, temperature 98.9 F (37.2 C), temperature source Oral, resp. rate 18, height 5\' 10"  (1.778 m), weight 86.183 kg (190 lb), SpO2 96.00%.  GEN:  Pleasant elderly obese Caucasian gentleman sitting up in the stretcher in no acute distress; cooperative with exam PSYCH:  alert and oriented ;  appear somewhat anxious ;  HEENT: Mucous membranes pink and anicteric; PERRLA; EOM intact; no cervical lymphadenopathy nor thyromegaly or carotid bruit; no JVD; his tongue moves around in his mouth in the manner of tardive dyskinesia Breasts:: Not examined CHEST WALL: No tenderness CHEST: Normal respiration,  coarse bibasilar crackles HEART: Regular rate and rhythm; no murmurs rubs or  gallops BACK: kyphosis no scoliosis; no CVA tenderness ABDOMEN: Obese, soft non-tender; no masses, no organomegaly, normal abdominal bowel sounds;  no intertriginous candida. Rectal Exam: Not done EXTREMITIES:  age-appropriate arthropathy of the hands and knees; no edema; no ulcerations. Genitalia: not examined PULSES: 2+ and symmetric SKIN: Normal hydration; 3 cm erythematous area on his upper back; swollen and edematous with multiple punctatations. CNS: Cranial nerves 3-12 grossly intact; mild left lower extremity weakness which is chronic    Labs on Admission:  Basic Metabolic Panel:  Lab 01/23/12 1610  NA 136  K 3.8  CL 102  CO2 26  GLUCOSE 170*  BUN 14  CREATININE 1.01  CALCIUM 9.5  MG --  PHOS --   Liver Function Tests:  Lab 01/23/12 1918  AST 22  ALT 21  ALKPHOS 63  BILITOT 0.2*  PROT 6.5  ALBUMIN 3.5    Lab 01/23/12 1918  LIPASE 46  AMYLASE --   No results found for this basename: AMMONIA:5 in the last 168 hours CBC:  Lab 01/23/12 1918  WBC 5.9  NEUTROABS 3.2  HGB 11.2*  HCT 33.3*  MCV 90.0  PLT 153   Cardiac Enzymes:  Lab 01/23/12 1918  CKTOTAL --  CKMB --   CKMBINDEX --  TROPONINI <0.30   BNP: No components found with this basename: POCBNP:5 CBG: No results found for this basename: GLUCAP:5 in the last 168 hours  Radiological Exams on Admission: Dg Chest 2 View  01/23/2012  *RADIOLOGY REPORT*  Clinical Data: Near-syncopal episode  CHEST - 2 VIEW  Comparison: 12/21/2011  Findings: Stable cardiomediastinal contours status post median sternotomy and CABG.  No focal consolidation, pleural effusion, or pneumothorax.  Mild rightward curvature of the thoracic spine. Mild multilevel degenerative changes.  No acute osseous finding.  IMPRESSION: Status post median sternotomy and CABG.  No radiographic evidence of acute cardiopulmonary process.  Original Report Authenticated By: Waneta Martins, M.D.   Ct Head Wo Contrast  01/23/2012  *RADIOLOGY REPORT*  Clinical Data: Dizziness and hypertension.  CT HEAD WITHOUT CONTRAST  Technique:  Contiguous axial images were obtained from the base of the skull through the vertex without contrast.  Comparison: 04/07/2010.  Findings: No evidence of acute infarct, acute hemorrhage, mass lesion, mass effect or hydrocephalus.  Atrophy.  Mild periventricular low attenuation.  Previously seen focal low attenuation in the right pons is less evident than on 04/07/2010. Visualized portions of the paranasal sinuses are clear.  IMPRESSION:  1.  No acute intracranial abnormality. 2.  Atrophy and mild chronic microvascular white matter ischemic changes.  Original Report Authenticated By: Reyes Ivan, M.D.    EKG: Independently reviewed. sinus rhythm with occasional PVCs; no acute ST segment changes.   Assessment/Plan Present on Admission:  .Dizziness .Near syncope .HYPERLIPIDEMIA, MIXED .HTN (hypertension) .GERD .DM (diabetes mellitus), type 2, uncontrolled .CAD (coronary artery disease) .BPH (benign prostatic hyperplasia)   PLAN:  this gentleman appears to be close to his baseline; because of his complaints at  the symptoms are similar to his acute coronary event, will bring them in on observation to rule out such an event, and optimize blood pressure control . Check for orthostasis; will optimize management of his GERD.   Have encouraged this person that she should not be living alone, and he indicates that he is currently organizing with his daughter to move into the city, and have a relative living right next to him.   Other plans as per orders.  Code Status: Full  Family Communication:  no family members available at this time  Disposition Plan:  can be discharged home if rules out for acute MI; continue to encourage assisted living facility transfer.   Sumedh Shinsato Nocturnist [facility] Pager [phone]   01/23/2012, 10:33 PM

## 2012-01-23 NOTE — ED Notes (Signed)
Judeth Cornfield [phone]

## 2012-01-23 NOTE — ED Provider Notes (Signed)
History     CSN: [id number]  Arrival date & time 01/23/12  1843   First MD Initiated Contact with Patient 01/23/12 1851      Chief Complaint  Patient presents with  . Near Syncope    HPI Pt was seen at 1900.  Per pt, c/o gradual onset and resolution of one episode of near syncope that began approx 1700 PTA.  Pt states he ate dinner and took his meds, then felt like he was "going to pass out" and felt generally weak.  Pt also endorses he has had "indigestion" on and off throughout the day today, describing his symptoms as "I get all these symptoms before I have a heart attack."  Denies palpitations, no cough, no abd pain, no back pain, no fevers, no N/V/D, no focal motor weakness, no tingling/numbness in extremities.     Past Medical History  Diagnosis Date  . Diabetes mellitus   . Hypercholesterolemia   . Coronary artery disease     a.  s/p CABG x 3 1989, VG->LCX, VG->RCA, LIMA->LAD;  b. 05/24/10 Cath/PCI: 3vd, LIMA patent, VG->RCA occluded, VG->LCX 90 in graft (3.0x15 promus) & 90 in distal LCX (2.75x15 Promus);  c. NSTEMI in 04/2011 - Cath occlusion of distal LCX stent.   . Coronary artery disease     a.  s/p CABG 1999;  b. s/p stenting to distal LCx, s/p stent to proximal SVG-LCx in 05/2010;  c. NSTEMI in 04/2011 with cath showing occluded OM2 graft   . Diabetes mellitus   . Hypertension   . PUD (peptic ulcer disease)   . Hypercholesteremia   . Myocardial infarct, old   . GERD (gastroesophageal reflux disease)   . Arthritis   . Osteoarthritis   . Stroke   . Pneumonia     Past Surgical History  Procedure Date  . Coronary angioplasty with stent placement   . Tonsillectomy   . Post aortocoronary bypass surgery   . Coronary artery bypass graft   . Coronary stent placement   . Coronary angioplasty     History  Substance Use Topics  . Smoking status: Former Smoker    Types: Cigarettes  . Smokeless tobacco: Current User    Types: Chew  . Alcohol Use: No    Review  of Systems ROS: Statement: All systems negative except as marked or noted in the HPI; Constitutional: Negative for fever and chills. ; ; Eyes: Negative for eye pain, redness and discharge. ; ; ENMT: Negative for ear pain, hoarseness, nasal congestion, sinus pressure and sore throat. ; ; Cardiovascular: Negative for chest pain, palpitations, diaphoresis, dyspnea and peripheral edema. ; ; Respiratory: Negative for cough, wheezing and stridor. ; ; Gastrointestinal: Negative for nausea, vomiting, diarrhea, abdominal pain, blood in stool, hematemesis, jaundice and rectal bleeding. . ; ; Genitourinary: Negative for dysuria, flank pain and hematuria. ; ; Musculoskeletal: Negative for back pain and neck pain. Negative for swelling and trauma.; ; Skin: Negative for pruritus, rash, abrasions, blisters, bruising and skin lesion.; ; Neuro: +lightheadedness, generalized weakness. Negative for headache and neck stiffness. Negative for altered level of consciousness , altered mental status, extremity weakness, paresthesias, involuntary movement, seizure and syncope.     Allergies  Review of patient's allergies indicates no known allergies.  Home Medications   Current Outpatient Rx  Name Route Sig Dispense Refill  . ALBUTEROL SULFATE HFA 108 (90 BASE) MCG/ACT IN AERS Inhalation Inhale 1-2 puffs into the lungs every 6 (six) hours as needed.    Marland Kitchen  ASPIRIN 325 MG PO TABS Oral Take 325 mg by mouth daily.    . ASPIRIN EFFERVESCENT 325 MG PO TBEF Oral Take 325 mg by mouth daily as needed. For heartburn    . B-COMPLEX PO Oral Take 1 tablet by mouth daily.    Marland Kitchen PEPTO-BISMOL PO Oral Take by mouth daily as needed. Patient states that he just takes a couple of swallows when needed to control bowels.     Marland Kitchen CARVEDILOL 6.25 MG PO TABS Oral Take 6.25 mg by mouth 2 (two) times daily with a meal. For blood pressure/heart    . CLOPIDOGREL BISULFATE 75 MG PO TABS Oral Take 75 mg by mouth daily. Anti-Stroke    . DOCUSATE SODIUM 100 MG  PO TABS Oral Take 100 mg by mouth at bedtime. Constipation    . ISOSORBIDE MONONITRATE ER 30 MG PO TB24 Oral Take 30 mg by mouth daily. For heart    . ADULT MULTIVITAMIN W/MINERALS CH Oral Take 1 tablet by mouth daily.    Marland Kitchen PANTOPRAZOLE SODIUM 40 MG PO TBEC Oral Take 1 tablet (40 mg total) by mouth 2 (two) times daily.    Marland Kitchen RAMIPRIL 5 MG PO CAPS Oral Take 5 mg by mouth daily. For blood pressure    . ROSUVASTATIN CALCIUM 10 MG PO TABS Oral Take 10 mg by mouth daily.    Marland Kitchen TAMSULOSIN HCL 0.4 MG PO CAPS Oral Take 0.4 mg by mouth at bedtime. For urinary health    . CALCIUM CARBONATE ANTACID 500 MG PO CHEW Oral Chew 2 tablets by mouth daily as needed.    Marland Kitchen LORATADINE 10 MG PO TABS Oral Take 10 mg by mouth daily.      Marland Kitchen MECLIZINE HCL 25 MG PO TABS Oral Take 25 mg by mouth daily as needed. For dizziness    . METFORMIN HCL 500 MG PO TABS Oral Take 500 mg by mouth 2 (two) times daily.     Marland Kitchen NITROGLYCERIN 0.4 MG SL SUBL Sublingual Place 1 tablet (0.4 mg total) under the tongue every 5 (five) minutes x 3 doses as needed for chest pain. 10 tablet 0  . SAXAGLIPTIN HCL 5 MG PO TABS Oral Take 5 mg by mouth at bedtime.     . TRAMADOL HCL 50 MG PO TABS Oral Take 50 mg by mouth every 6 (six) hours as needed. FOR PAIN      BP 143/74  Pulse 78  Temp 98.9 F (37.2 C) (Oral)  Resp 20  Ht 5\' 10"  (1.778 m)  Wt 190 lb (86.183 kg)  BMI 27.26 kg/m2  SpO2 96%  Physical Exam 1905: Physical examination:  Nursing notes reviewed; Vital signs and O2 SAT reviewed;  Constitutional: Well developed, Well nourished, Well hydrated, In no acute distress; Head:  Normocephalic, atraumatic; Eyes: EOMI, PERRL, No scleral icterus; ENMT: Mouth and pharynx normal, Mucous membranes moist; Neck: Supple, Full range of motion, No lymphadenopathy; Cardiovascular: Regular rate and rhythm, No murmur, rub, or gallop; Respiratory: Breath sounds clear & equal bilaterally, No rales, rhonchi, wheezes.  Speaking full sentences with ease, Normal  respiratory effort/excursion; Chest: Nontender, Movement normal; Abdomen: Soft, Nontender, Nondistended, Normal bowel sounds;; Extremities: Pulses normal, No tenderness, No edema, No calf edema or asymmetry.; Neuro: AA&Ox3, Major CN grossly intact.  Speech clear. DTR 2/4 equal bilat UE's and LE's.  No facial droop, no nystagmus.  No gross focal motor or sensory deficits in extremities.; Skin: Color normal, Warm, Dry.; Psych:  Anxious, rapid speech.   ED  Course  Procedures    MDM  MDM Reviewed: nursing note, vitals and previous chart Reviewed previous: ECG Interpretation: ECG, labs and x-ray      Date: 01/23/2012  Rate: 75  Rhythm: normal sinus rhythm and premature ventricular contractions (PVC)  QRS Axis: normal  Intervals: normal  ST/T Wave abnormalities: nonspecific T wave changes  Conduction Disutrbances:none  Narrative Interpretation:   Old EKG Reviewed: changes noted, new NS T-wave changes compared to previous EKG dated 12/21/2011.  Results for orders placed during the hospital encounter of 01/23/12  CBC WITH DIFFERENTIAL      Component Value Range   WBC 5.9  4.0 - 10.5 K/uL   RBC 3.70 (*) 4.22 - 5.81 MIL/uL   Hemoglobin 11.2 (*) 13.0 - 17.0 g/dL   HCT 04.5 (*) 40.9 - 81.1 %   MCV 90.0  78.0 - 100.0 fL   MCH 30.3  26.0 - 34.0 pg   MCHC 33.6  30.0 - 36.0 g/dL   RDW 91.4  78.2 - 95.6 %   Platelets 153  150 - 400 K/uL   Neutrophils Relative 55  43 - 77 %   Neutro Abs 3.2  1.7 - 7.7 K/uL   Lymphocytes Relative 23  12 - 46 %   Lymphs Abs 1.4  0.7 - 4.0 K/uL   Monocytes Relative 17 (*) 3 - 12 %   Monocytes Absolute 1.0  0.1 - 1.0 K/uL   Eosinophils Relative 5  0 - 5 %   Eosinophils Absolute 0.3  0.0 - 0.7 K/uL   Basophils Relative 0  0 - 1 %   Basophils Absolute 0.0  0.0 - 0.1 K/uL  TROPONIN I      Component Value Range   Troponin I <0.30  <0.30 ng/mL  URINALYSIS, ROUTINE W REFLEX MICROSCOPIC      Component Value Range   Color, Urine YELLOW  YELLOW   APPearance CLEAR   CLEAR   Specific Gravity, Urine 1.010  1.005 - 1.030   pH 7.0  5.0 - 8.0   Glucose, UA NEGATIVE  NEGATIVE mg/dL   Hgb urine dipstick NEGATIVE  NEGATIVE   Bilirubin Urine NEGATIVE  NEGATIVE   Ketones, ur NEGATIVE  NEGATIVE mg/dL   Protein, ur NEGATIVE  NEGATIVE mg/dL   Urobilinogen, UA 0.2  0.0 - 1.0 mg/dL   Nitrite NEGATIVE  NEGATIVE   Leukocytes, UA NEGATIVE  NEGATIVE  COMPREHENSIVE METABOLIC PANEL      Component Value Range   Sodium 136  135 - 145 mEq/L   Potassium 3.8  3.5 - 5.1 mEq/L   Chloride 102  96 - 112 mEq/L   CO2 26  19 - 32 mEq/L   Glucose, Bld 170 (*) 70 - 99 mg/dL   BUN 14  6 - 23 mg/dL   Creatinine, Ser 2.13  0.50 - 1.35 mg/dL   Calcium 9.5  8.4 - 08.6 mg/dL   Total Protein 6.5  6.0 - 8.3 g/dL   Albumin 3.5  3.5 - 5.2 g/dL   AST 22  0 - 37 U/L   ALT 21  0 - 53 U/L   Alkaline Phosphatase 63  39 - 117 U/L   Total Bilirubin 0.2 (*) 0.3 - 1.2 mg/dL   GFR calc non Af Amer 68 (*) >90 mL/min   GFR calc Af Amer 79 (*) >90 mL/min  LIPASE, BLOOD      Component Value Range   Lipase 46  11 - 59 U/L   Dg  Chest 2 View 01/23/2012  *RADIOLOGY REPORT*  Clinical Data: Near-syncopal episode  CHEST - 2 VIEW  Comparison: 12/21/2011  Findings: Stable cardiomediastinal contours status post median sternotomy and CABG.  No focal consolidation, pleural effusion, or pneumothorax.  Mild rightward curvature of the thoracic spine. Mild multilevel degenerative changes.  No acute osseous finding.  IMPRESSION: Status post median sternotomy and CABG.  No radiographic evidence of acute cardiopulmonary process.  Original Report Authenticated By: Waneta Martins, M.D.   Ct Head Wo Contrast 01/23/2012  *RADIOLOGY REPORT*  Clinical Data: Dizziness and hypertension.  CT HEAD WITHOUT CONTRAST  Technique:  Contiguous axial images were obtained from the base of the skull through the vertex without contrast.  Comparison: 04/07/2010.  Findings: No evidence of acute infarct, acute hemorrhage, mass lesion,  mass effect or hydrocephalus.  Atrophy.  Mild periventricular low attenuation.  Previously seen focal low attenuation in the right pons is less evident than on 04/07/2010. Visualized portions of the paranasal sinuses are clear.  IMPRESSION:  1.  No acute intracranial abnormality. 2.  Atrophy and mild chronic microvascular white matter ischemic changes.  Original Report Authenticated By: Reyes Ivan, M.D.    2130:  Pt with mild orthostasis during VS.  Concern regarding pt's description of his symptoms, as well as new subtle NS T-wave changes on today's EKG.  Long d/w pt regarding him feeling safe/secure at home alone (apparently [place] area).  T/C to [facility] Orvan Falconer, case discussed, including:  HPI, pertinent PM/SHx, VS/PE, dx testing, ED course and treatment:  Agreeable to observation admit, requests to obtain tele bed.    Laray Anger, DO 01/24/12 1429

## 2012-01-24 DIAGNOSIS — I2510 Atherosclerotic heart disease of native coronary artery without angina pectoris: Secondary | ICD-10-CM

## 2012-01-24 DIAGNOSIS — R42 Dizziness and giddiness: Secondary | ICD-10-CM

## 2012-01-24 LAB — CBC
HCT: 37 % — ABNORMAL LOW (ref 39.0–52.0)
Platelets: 168 10*3/uL (ref 150–400)
RDW: 13.4 % (ref 11.5–15.5)
WBC: 6.7 10*3/uL (ref 4.0–10.5)

## 2012-01-24 LAB — HEMOGLOBIN A1C
Hgb A1c MFr Bld: 7.3 % — ABNORMAL HIGH (ref <5.7)
Mean Plasma Glucose: 163 mg/dL — ABNORMAL HIGH (ref <117)

## 2012-01-24 LAB — BASIC METABOLIC PANEL
Chloride: 104 mEq/L (ref 96–112)
Creatinine, Ser: 0.89 mg/dL (ref 0.50–1.35)
GFR calc Af Amer: >90 mL/min (ref >90)

## 2012-01-24 LAB — CARDIAC PANEL(CRET KIN+CKTOT+MB+TROPI)
CK, MB: 14.9 ng/mL (ref 0.3–4.0)
CK, MB: 16.6 ng/mL (ref 0.3–4.0)
Total CK: 498 U/L — ABNORMAL HIGH (ref 7–232)

## 2012-01-24 MED ORDER — INSULIN ASPART 100 UNIT/ML ~~LOC~~ SOLN: 0.0000 [IU] | Freq: Every day | SUBCUTANEOUS | Status: DC

## 2012-01-24 MED ORDER — INSULIN ASPART 100 UNIT/ML ~~LOC~~ SOLN
0.0000 [IU] | Freq: Three times a day (TID) | SUBCUTANEOUS | Status: DC
  Administered 2012-01-24 (×2): 2 [IU] via SUBCUTANEOUS

## 2012-01-24 NOTE — Discharge Summary (Signed)
Physician Discharge Summary  NICK STULTS YNW:[id number] DOB: 10-28-1928 DOA: 01/23/2012  PCP: [name], MD  Admit date: 01/23/2012 Discharge date: 01/24/2012  Recommendations for Outpatient Follow-up:  1. Followup with FUSCO,LAWRENCE J., MD in 1 week for blood pressure management. 2. Followup with [facility] in 1 week. 3. Home health RN, PT, and aid to help the patient at home until patient can move into an apartment, possible assisted living facility by end of this month.  Discharge Diagnoses:  Principal Problem:  *Dizziness Active Problems:  HYPERLIPIDEMIA, MIXED  GERD  BPH (benign prostatic hyperplasia)  Near syncope  DM (diabetes mellitus), type 2, uncontrolled  HTN (hypertension)  Hyperlipidemia  CAD (coronary artery disease)  Discharge Condition: Stable  Diet recommendation: Diabetic diet  History of present illness:  Mr. Ottey is a 78 year old gentleman with multiple medical conditions presented on 01/24/2012 with dizziness.  Hospital Course:  Dizziness Resolved. Was slightly orthostatic yesterday which resolved with IV fluids. Currently denies any dizziness prior to discharge. Blood pressure improved.  Uncertain if patient had diaphoresis and dizziness from GI etiology as he takes a fair amount of laxatives and Pepto-Bismol daily, requested that he cut back on such agents prior to discharge.  Arranged for home health RN and, physical therapy and aid to monitor the patient until he moves to an apartment in the city, possible assisted living facility.  Coronary artery disease Continue carvedilol, aspirin, and plavix.  Near syncope Resolved. Troponins negative x3.  Management as indicated above.  Hypertension Continue carvedilol, isosorbide mononitrate, and ramipril.  Will request home health RN to monitor his blood pressure at home.  Type 2 diabetes uncontrolled with complications Blood sugar stable.  Continue home diabetic regimen at  discharge.  GERD Continue PPI.  Social Patient reports that he and his daughter have been working on bring him closer to the city with an apartment, may be assisted living facility.  Will arrange for home health RN and physical therapy until patient moves to the apartment.  Code Status: Full  Family Communication: no family members available. Disposition Plan: Discharge the patient home, with followup with cardiology and primary care physician.  Procedures:  None  Consultations:  None  Discharge Exam: Filed Vitals:   01/24/12 0841  BP: 123/73  Pulse: 76  Temp:   Resp: 18   Filed Vitals:   01/24/12 0522 01/24/12 0523 01/24/12 0524 01/24/12 0841  BP: 156/55 175/83 159/90 123/73  Pulse: 75 74 80 76  Temp:  98.3 F (36.8 C)    TempSrc:      Resp:  18 18 18   Height:      Weight:   80.2 kg (176 lb 12.9 oz)   SpO2: 98% 97% 97% 96%   Discharge Instructions  Discharge Orders    Future Appointments: Provider: Department: Dept Phone: Center:   01/29/2012 1:00 PM Jodelle Gross, NP [facility] [phone] [id number]   03/05/2012 11:00 AM Jodelle Gross, NP [facility] [phone] [id number]     Future Orders Please Complete By Expires   Diet Carb Modified      Increase activity slowly      Discharge instructions      Comments:   Followup with [name], MD (PCP) in 1 week. Followup with [facility] in 1 week.     Medication List  As of 01/24/2012  9:17 AM   TAKE these medications         aspirin 325 MG tablet   Take 325 mg by mouth daily.  aspirin-sod bicarb-citric acid 325 MG Tbef   Commonly known as: ALKA-SELTZER   Take 325 mg by mouth daily as needed. For heartburn      B-COMPLEX PO   Take 1 tablet by mouth daily.      calcium carbonate 500 MG chewable tablet   Commonly known as: TUMS - dosed in mg elemental calcium   Chew 2 tablets by mouth daily as needed.      carvedilol 6.25 MG tablet   Commonly known as: COREG    Take 6.25 mg by mouth 2 (two) times daily with a meal. For blood pressure/heart      clopidogrel 75 MG tablet   Commonly known as: PLAVIX   Take 75 mg by mouth daily. Anti-Stroke      isosorbide mononitrate 30 MG 24 hr tablet   Commonly known as: IMDUR   Take 30 mg by mouth daily. For heart      metFORMIN 500 MG tablet   Commonly known as: GLUCOPHAGE   Take 500 mg by mouth 2 (two) times daily.      multivitamin with minerals Tabs   Take 1 tablet by mouth daily.      nitroGLYCERIN 0.4 MG SL tablet   Commonly known as: NITROSTAT   Place 1 tablet (0.4 mg total) under the tongue every 5 (five) minutes x 3 doses as needed for chest pain.      ONGLYZA 5 MG Tabs tablet   Generic drug: saxagliptin HCl   Take 5 mg by mouth at bedtime.      pantoprazole 40 MG tablet   Commonly known as: PROTONIX   Take 1 tablet (40 mg total) by mouth 2 (two) times daily.      PEPTO-BISMOL PO   Take by mouth daily as needed. Patient states that he just takes a couple of swallows when needed to control bowels.      PROAIR HFA 108 (90 BASE) MCG/ACT inhaler   Generic drug: albuterol   Inhale 1-2 puffs into the lungs every 6 (six) hours as needed.      ramipril 5 MG capsule   Commonly known as: ALTACE   Take 5 mg by mouth daily. For blood pressure      rosuvastatin 10 MG tablet   Commonly known as: CRESTOR   Take 10 mg by mouth daily.      STOOL SOFTENER 100 MG capsule   Generic drug: Docusate Sodium   Take 100 mg by mouth at bedtime. Constipation      Tamsulosin HCl 0.4 MG Caps   Commonly known as: FLOMAX   Take 0.4 mg by mouth at bedtime. For urinary health      traMADol 50 MG tablet   Commonly known as: ULTRAM   Take 50 mg by mouth every 8 (eight) hours as needed.              The results of significant diagnostics from this hospitalization (including imaging, microbiology, ancillary and laboratory) are listed below for reference.    Significant Diagnostic Studies: Dg Chest 2  View  01/23/2012  *RADIOLOGY REPORT*  Clinical Data: Near-syncopal episode  CHEST - 2 VIEW  Comparison: 12/21/2011  Findings: Stable cardiomediastinal contours status post median sternotomy and CABG.  No focal consolidation, pleural effusion, or pneumothorax.  Mild rightward curvature of the thoracic spine. Mild multilevel degenerative changes.  No acute osseous finding.  IMPRESSION: Status post median sternotomy and CABG.  No radiographic evidence of acute cardiopulmonary process.  Original  Report Authenticated By: Waneta Martins, M.D.   Ct Head Wo Contrast  01/23/2012  *RADIOLOGY REPORT*  Clinical Data: Dizziness and hypertension.  CT HEAD WITHOUT CONTRAST  Technique:  Contiguous axial images were obtained from the base of the skull through the vertex without contrast.  Comparison: 04/07/2010.  Findings: No evidence of acute infarct, acute hemorrhage, mass lesion, mass effect or hydrocephalus.  Atrophy.  Mild periventricular low attenuation.  Previously seen focal low attenuation in the right pons is less evident than on 04/07/2010. Visualized portions of the paranasal sinuses are clear.  IMPRESSION:  1.  No acute intracranial abnormality. 2.  Atrophy and mild chronic microvascular white matter ischemic changes.  Original Report Authenticated By: Reyes Ivan, M.D.    Microbiology: No results found for this or any previous visit (from the past 240 hour(s)).   Labs: Basic Metabolic Panel:  Lab 01/24/12 1610 01/23/12 1918  NA 139 136  K 4.1 3.8  CL 104 102  CO2 27 26  GLUCOSE 163* 170*  BUN 11 14  CREATININE 0.89 1.01  CALCIUM 9.5 9.5  MG -- --  PHOS -- --   Liver Function Tests:  Lab 01/23/12 1918  AST 22  ALT 21  ALKPHOS 63  BILITOT 0.2*  PROT 6.5  ALBUMIN 3.5    Lab 01/23/12 1918  LIPASE 46  AMYLASE --   No results found for this basename: AMMONIA:5 in the last 168 hours CBC:  Lab 01/24/12 0602 01/23/12 1918  WBC 6.7 5.9  NEUTROABS -- 3.2  HGB 12.0* 11.2*   HCT 37.0* 33.3*  MCV 90.5 90.0  PLT 168 153   Cardiac Enzymes:  Lab 01/24/12 0602 01/24/12 0009 01/23/12 1918  CKTOTAL 484* 498* --  CKMB 16.6* 14.9* --  CKMBINDEX -- -- --  TROPONINI <0.30 <0.30 <0.30   BNP: BNP (last 3 results)  Basename 10/21/11 0058  PROBNP 267.3   CBG:  Lab 01/24/12 0749  GLUCAP 163*    Time coordinating discharge: 25  Signed:  Jaquavious Mercer A  [facility] 01/24/2012, 9:17 AM

## 2012-01-24 NOTE — Progress Notes (Signed)
Discharge instructions reviewed with patient and patient's granddaughter, patient voiced understanding. Patient given discharge instructions. Patient in stable condition and transported out by this RN.

## 2012-01-24 NOTE — Progress Notes (Signed)
Subjective: Denies any chest pain. Feeling better. Ready to go home.  Objective: Vital signs in last 24 hours: Filed Vitals:   01/24/12 0522 01/24/12 0523 01/24/12 0524 01/24/12 0841  BP: 156/55 175/83 159/90 123/73  Pulse: 75 74 80 76  Temp:  98.3 F (36.8 C)    TempSrc:      Resp:  18 18 18   Height:      Weight:   80.2 kg (176 lb 12.9 oz)   SpO2: 98% 97% 97% 96%   Weight change:   Intake/Output Summary (Last 24 hours) at 01/24/12 0906 Last data filed at 01/24/12 4098  Gross per 24 hour  Intake  702.5 ml  Output   1300 ml  Net -597.5 ml    Physical Exam: General: Awake, Oriented, No acute distress. HEENT: EOMI. Neck: Supple CV: S1 and S2 Lungs: Clear to ascultation bilaterally Abdomen: Soft, Nontender, Nondistended, +bowel sounds. Ext: Good pulses. Trace edema.  Lab Results: Basic Metabolic Panel:  Lab 01/24/12 1191 01/23/12 1918  NA 139 136  K 4.1 3.8  CL 104 102  CO2 27 26  GLUCOSE 163* 170*  BUN 11 14  CREATININE 0.89 1.01  CALCIUM 9.5 9.5  MG -- --  PHOS -- --   Liver Function Tests:  Lab 01/23/12 1918  AST 22  ALT 21  ALKPHOS 63  BILITOT 0.2*  PROT 6.5  ALBUMIN 3.5    Lab 01/23/12 1918  LIPASE 46  AMYLASE --   No results found for this basename: AMMONIA:5 in the last 168 hours CBC:  Lab 01/24/12 0602 01/23/12 1918  WBC 6.7 5.9  NEUTROABS -- 3.2  HGB 12.0* 11.2*  HCT 37.0* 33.3*  MCV 90.5 90.0  PLT 168 153   Cardiac Enzymes:  Lab 01/24/12 0602 01/24/12 0009 01/23/12 1918  CKTOTAL 484* 498* --  CKMB 16.6* 14.9* --  CKMBINDEX -- -- --  TROPONINI <0.30 <0.30 <0.30   BNP (last 3 results)  Basename 10/21/11 0058  PROBNP 267.3   CBG:  Lab 01/24/12 0749  GLUCAP 163*   No results found for this basename: HGBA1C:5 in the last 72 hours Other Labs: No components found with this basename: POCBNP:3 No results found for this basename: DDIMER:2 in the last 168 hours No results found for this basename:  CHOL:2,HDL:2,LDLCALC:2,TRIG:2,CHOLHDL:2,LDLDIRECT:2 in the last 168 hours No results found for this basename: TSH,T4TOTAL,FREET3,T3FREE,FREET4,THYROIDAB in the last 168 hours No results found for this basename: VITAMINB12:2,FOLATE:2,FERRITIN:2,TIBC:2,IRON:2,RETICCTPCT:2 in the last 168 hours  Micro Results: No results found for this or any previous visit (from the past 240 hour(s)).  Studies/Results: Dg Chest 2 View  01/23/2012  *RADIOLOGY REPORT*  Clinical Data: Near-syncopal episode  CHEST - 2 VIEW  Comparison: 12/21/2011  Findings: Stable cardiomediastinal contours status post median sternotomy and CABG.  No focal consolidation, pleural effusion, or pneumothorax.  Mild rightward curvature of the thoracic spine. Mild multilevel degenerative changes.  No acute osseous finding.  IMPRESSION: Status post median sternotomy and CABG.  No radiographic evidence of acute cardiopulmonary process.  Original Report Authenticated By: Waneta Martins, M.D.   Ct Head Wo Contrast  01/23/2012  *RADIOLOGY REPORT*  Clinical Data: Dizziness and hypertension.  CT HEAD WITHOUT CONTRAST  Technique:  Contiguous axial images were obtained from the base of the skull through the vertex without contrast.  Comparison: 04/07/2010.  Findings: No evidence of acute infarct, acute hemorrhage, mass lesion, mass effect or hydrocephalus.  Atrophy.  Mild periventricular low attenuation.  Previously seen focal low attenuation in  the right pons is less evident than on 04/07/2010. Visualized portions of the paranasal sinuses are clear.  IMPRESSION:  1.  No acute intracranial abnormality. 2.  Atrophy and mild chronic microvascular white matter ischemic changes.  Original Report Authenticated By: Reyes Ivan, M.D.    Medications: I have reviewed the patient's current medications. Scheduled Meds:   . aspirin  325 mg Oral Daily  . atorvastatin  20 mg Oral q1800  . carvedilol  6.25 mg Oral BID WC  . clopidogrel  75 mg Oral Daily    . docusate sodium  100 mg Oral QHS  . enoxaparin (LOVENOX) injection  40 mg Subcutaneous Q24H  . insulin aspart  0-5 Units Subcutaneous QHS  . insulin aspart  0-9 Units Subcutaneous TID WC  . isosorbide mononitrate  30 mg Oral Daily  . loratadine  10 mg Oral Daily  . metFORMIN  500 mg Oral BID WC  . mupirocin cream   Topical TID  . pantoprazole  40 mg Oral BID  . ramipril  5 mg Oral Daily  . sodium chloride  3 mL Intravenous Q12H  . Tamsulosin HCl  0.4 mg Oral QHS  . DISCONTD: aspirin EC  81 mg Oral Daily   Continuous Infusions:   . 0.9 % NaCl with KCl 20 mEq / L 50 mL/hr (01/24/12 0009)   PRN Meds:.acetaminophen, bisacodyl, nitroGLYCERIN, ondansetron (ZOFRAN) IV, sodium phosphate, traMADol, traZODone  Assessment/Plan: Dizziness Resolved. Was slightly orthostatic yesterday which resolved. Currently denies any dizziness. Blood pressure improved.  Coronary artery disease Continue carvedilol, aspirin, and plavix.  Near syncope Resolved. Troponins negative x3.  Hypertension Continue carvedilol, isosorbide mononitrate, and ramipril.  Will request home health RN to monitor his blood pressure at home.  Type 2 diabetes uncontrolled with complications Blood sugar stable.  Continue home diabetic regimen at discharge.  GERD Continue PPI.  Social Patient reports that he and his daughter have been working on bring him closer to the city with an apartment, may be assisted living facility.  Will arrange for home health RN and physical therapy until patient moves to the apartment.  Code Status: Full  Family Communication: no family members available. Disposition Plan: Discharge the patient home, with followup with cardiology and primary care physician.    LOS: 1 day  Atthew Coutant A, MD 01/24/2012, 9:06 AM

## 2012-01-25 LAB — URINE CULTURE

## 2012-01-26 ENCOUNTER — Other Ambulatory Visit: Payer: Self-pay

## 2012-01-26 DIAGNOSIS — Z7901 Long term (current) use of anticoagulants: Secondary | ICD-10-CM

## 2012-01-29 ENCOUNTER — Ambulatory Visit (INDEPENDENT_AMBULATORY_CARE_PROVIDER_SITE_OTHER): Payer: Medicare Other | Admitting: Adult Health

## 2012-01-29 ENCOUNTER — Encounter: Payer: Self-pay | Admitting: Adult Health

## 2012-01-29 ENCOUNTER — Ambulatory Visit: Payer: Self-pay | Admitting: Adult Health

## 2012-01-29 VITALS — BP 110/60 | HR 83 | Ht 70.0 in | Wt 181.0 lb

## 2012-01-29 DIAGNOSIS — I200 Unstable angina: Secondary | ICD-10-CM

## 2012-01-29 DIAGNOSIS — R0600 Dyspnea, unspecified: Secondary | ICD-10-CM

## 2012-01-29 DIAGNOSIS — R0989 Other specified symptoms and signs involving the circulatory and respiratory systems: Secondary | ICD-10-CM

## 2012-01-29 DIAGNOSIS — I10 Essential (primary) hypertension: Secondary | ICD-10-CM

## 2012-01-29 MED ORDER — ALBUTEROL SULFATE HFA 108 (90 BASE) MCG/ACT IN AERS: 1.0000 | INHALATION_SPRAY | Freq: Four times a day (QID) | RESPIRATORY_TRACT | Status: DC | PRN

## 2012-01-29 NOTE — Assessment & Plan Note (Signed)
He is without complaint of chest pain. Breathing status has improved. He is drinking water and not over exerting himself in the sun or hot weather. He will continue current medications. Will see him in 6 months unless symptomatic.

## 2012-01-29 NOTE — Assessment & Plan Note (Signed)
Blood pressure is well controlled. No changes in medications.  

## 2012-01-29 NOTE — Assessment & Plan Note (Signed)
Use of inhaler has helped him significantly. He is to continue this and stop smoking.

## 2012-01-29 NOTE — Patient Instructions (Addendum)
Your physician recommends that you schedule a follow-up appointment in: 6 MONTHS 

## 2012-01-29 NOTE — Progress Notes (Signed)
HPI: Jeffrey [name] is a 78 y/o patient of Dr. Dietrich Pates that we follow for ongoing assessment and treatment of atrial flutter, COPD, acute on chronic CHF and tobacco abuse. He was recently hospitalized one week ago for symptoms of dehydration where he was hydrated and released. He states that his breathing status is improved and he is staying out of the sun. He is not on a diuretic.   No Known Allergies  Current Outpatient Prescriptions  Medication Sig Dispense Refill  . albuterol (PROAIR HFA) 108 (90 BASE) MCG/ACT inhaler Inhale 1-2 puffs into the lungs every 6 (six) hours as needed.  1 Inhaler  6  . aspirin 325 MG tablet Take 325 mg by mouth daily.      Marland Kitchen aspirin-sod bicarb-citric acid (ALKA-SELTZER) 325 MG TBEF Take 325 mg by mouth daily as needed. For heartburn      . B Complex-Biotin-FA (B-COMPLEX PO) Take 1 tablet by mouth daily.      . Bismuth Subsalicylate (PEPTO-BISMOL PO) Take by mouth daily as needed. Patient states that he just takes a couple of swallows when needed to control bowels.       . calcium carbonate (TUMS - DOSED IN MG ELEMENTAL CALCIUM) 500 MG chewable tablet Chew 2 tablets by mouth daily as needed.      . carvedilol (COREG) 6.25 MG tablet Take 6.25 mg by mouth 2 (two) times daily with a meal. For blood pressure/heart      . clopidogrel (PLAVIX) 75 MG tablet Take 75 mg by mouth daily. Anti-Stroke      . Docusate Sodium (STOOL SOFTENER) 100 MG capsule Take 100 mg by mouth at bedtime. Constipation      . isosorbide mononitrate (IMDUR) 30 MG 24 hr tablet Take 30 mg by mouth daily. For heart      . metFORMIN (GLUCOPHAGE) 500 MG tablet Take 500 mg by mouth 2 (two) times daily.      . Multiple Vitamin (MULTIVITAMIN WITH MINERALS) TABS Take 1 tablet by mouth daily.      . nitroGLYCERIN (NITROSTAT) 0.4 MG SL tablet Place 1 tablet (0.4 mg total) under the tongue every 5 (five) minutes x 3 doses as needed for chest pain.  10 tablet  0  . pantoprazole (PROTONIX) 40 MG tablet Take 1  tablet (40 mg total) by mouth 2 (two) times daily.      . ramipril (ALTACE) 5 MG capsule Take 5 mg by mouth daily. For blood pressure      . rosuvastatin (CRESTOR) 10 MG tablet Take 10 mg by mouth daily.      . saxagliptin HCl (ONGLYZA) 5 MG TABS tablet Take 5 mg by mouth at bedtime.       . Tamsulosin HCl (FLOMAX) 0.4 MG CAPS Take 0.4 mg by mouth at bedtime. For urinary health      . traMADol (ULTRAM) 50 MG tablet Take 50 mg by mouth every 8 (eight) hours as needed.        Past Medical History  Diagnosis Date  . Diabetes mellitus   . Hypercholesterolemia   . Coronary artery disease     a.  s/p CABG x 3 1989, VG->LCX, VG->RCA, LIMA->LAD;  b. 05/24/10 Cath/PCI: 3vd, LIMA patent, VG->RCA occluded, VG->LCX 90 in graft (3.0x15 promus) & 90 in distal LCX (2.75x15 Promus);  c. NSTEMI in 04/2011 - Cath occlusion of distal LCX stent.   . Coronary artery disease     a.  s/p CABG 1999;  b. s/p stenting to  distal LCx, s/p stent to proximal SVG-LCx in 05/2010;  c. NSTEMI in 04/2011 with cath showing occluded OM2 graft   . Diabetes mellitus   . Hypertension   . PUD (peptic ulcer disease)   . Hypercholesteremia   . Myocardial infarct, old   . GERD (gastroesophageal reflux disease)   . Arthritis   . Osteoarthritis   . Stroke   . Pneumonia     Past Surgical History  Procedure Date  . Coronary angioplasty with stent placement   . Tonsillectomy   . Post aortocoronary bypass surgery   . Coronary artery bypass graft   . Coronary stent placement   . Coronary angioplasty     ROS: PHYSICAL EXAM BP 110/60  Pulse 83  Ht 5\' 10"  (1.778 m)  Wt 181 lb (82.101 kg)  BMI 25.97 kg/m2  SpO2 95%  EKG:  ASSESSMENT AND PLAN

## 2012-02-19 ENCOUNTER — Other Ambulatory Visit: Payer: Self-pay | Admitting: Adult Health

## 2012-02-28 ENCOUNTER — Emergency Department (HOSPITAL_COMMUNITY): Payer: Medicare Other

## 2012-02-28 ENCOUNTER — Other Ambulatory Visit: Payer: Self-pay

## 2012-02-28 ENCOUNTER — Emergency Department (HOSPITAL_COMMUNITY)
Admission: EM | Admit: 2012-02-28 | Discharge: 2012-02-28 | Disposition: A | Discharge status: Home / Self Care | Payer: Medicare Other | Attending: Emergency Medicine | Admitting: Emergency Medicine

## 2012-02-28 ENCOUNTER — Encounter (HOSPITAL_COMMUNITY): Payer: Self-pay | Admitting: Emergency Medicine

## 2012-02-28 DIAGNOSIS — Z951 Presence of aortocoronary bypass graft: Secondary | ICD-10-CM | POA: Insufficient documentation

## 2012-02-28 DIAGNOSIS — R079 Chest pain, unspecified: Secondary | ICD-10-CM | POA: Insufficient documentation

## 2012-02-28 DIAGNOSIS — I252 Old myocardial infarction: Secondary | ICD-10-CM | POA: Insufficient documentation

## 2012-02-28 DIAGNOSIS — K219 Gastro-esophageal reflux disease without esophagitis: Secondary | ICD-10-CM | POA: Insufficient documentation

## 2012-02-28 DIAGNOSIS — E119 Type 2 diabetes mellitus without complications: Secondary | ICD-10-CM | POA: Insufficient documentation

## 2012-02-28 DIAGNOSIS — E7800 Pure hypercholesterolemia, unspecified: Secondary | ICD-10-CM | POA: Insufficient documentation

## 2012-02-28 DIAGNOSIS — K2990 Gastroduodenitis, unspecified, without bleeding: Secondary | ICD-10-CM | POA: Insufficient documentation

## 2012-02-28 DIAGNOSIS — Z8711 Personal history of peptic ulcer disease: Secondary | ICD-10-CM | POA: Insufficient documentation

## 2012-02-28 DIAGNOSIS — Z79899 Other long term (current) drug therapy: Secondary | ICD-10-CM | POA: Insufficient documentation

## 2012-02-28 DIAGNOSIS — I10 Essential (primary) hypertension: Secondary | ICD-10-CM | POA: Insufficient documentation

## 2012-02-28 DIAGNOSIS — Z8673 Personal history of transient ischemic attack (TIA), and cerebral infarction without residual deficits: Secondary | ICD-10-CM | POA: Insufficient documentation

## 2012-02-28 DIAGNOSIS — I2510 Atherosclerotic heart disease of native coronary artery without angina pectoris: Secondary | ICD-10-CM | POA: Insufficient documentation

## 2012-02-28 DIAGNOSIS — K2970 Gastritis, unspecified, without bleeding: Secondary | ICD-10-CM | POA: Insufficient documentation

## 2012-02-28 LAB — CBC WITH DIFFERENTIAL/PLATELET
Basophils Absolute: 0 10*3/uL (ref 0.0–0.1)
Basophils Relative: 0 % (ref 0–1)
Eosinophils Absolute: 0.3 10*3/uL (ref 0.0–0.7)
Eosinophils Relative: 4 % (ref 0–5)
MCH: 29.7 pg (ref 26.0–34.0)
MCV: 88.4 fL (ref 78.0–100.0)
Platelets: 147 10*3/uL — ABNORMAL LOW (ref 150–400)
RDW: 13.5 % (ref 11.5–15.5)

## 2012-02-28 LAB — BASIC METABOLIC PANEL
Calcium: 9.3 mg/dL (ref 8.4–10.5)
GFR calc non Af Amer: 79 mL/min — ABNORMAL LOW (ref >90)
Sodium: 136 mEq/L (ref 135–145)

## 2012-02-28 LAB — TROPONIN I
Troponin I: <0.3 ng/mL (ref <0.30)
Troponin I: <0.3 ng/mL (ref <0.30)

## 2012-02-28 NOTE — ED Provider Notes (Signed)
History     CSN: [id number]  Arrival date & time 02/28/12  1538   First MD Initiated Contact with Patient 02/28/12 1609      Chief Complaint  Patient presents with  . Chest Pain    (Consider location/radiation/quality/duration/timing/severity/associated sxs/prior treatment) HPI Comments: Jeffrey [name] is a 78 y.o. Male who ate lunch. Today, a hot dog, then developed indigestion. He took 2 TUMS, then 2 nitroglycerin, then, the discomfort resolved completely. At worst, the pain was 3/10. The pain lasted less than 30 minutes. He has no pain now. He had concerned that this could be his heart, she took his blood pressure, and found it elevated. Subsequent to that he called an ambulance to bring him here. He sees his cardiologist regularly. He takes Tums daily. He lives with his wife. No recent illnesses. He had aspirin prior to arrival.   Known aggravating or palliative factors  The history is provided by the patient.    Past Medical History  Diagnosis Date  . Diabetes mellitus   . Hypercholesterolemia   . Coronary artery disease     a.  s/p CABG x 3 1989, VG->LCX, VG->RCA, LIMA->LAD;  b. 05/24/10 Cath/PCI: 3vd, LIMA patent, VG->RCA occluded, VG->LCX 90 in graft (3.0x15 promus) & 90 in distal LCX (2.75x15 Promus);  c. NSTEMI in 04/2011 - Cath occlusion of distal LCX stent.   . Coronary artery disease     a.  s/p CABG 1999;  b. s/p stenting to distal LCx, s/p stent to proximal SVG-LCx in 05/2010;  c. NSTEMI in 04/2011 with cath showing occluded OM2 graft   . Diabetes mellitus   . Hypertension   . PUD (peptic ulcer disease)   . Hypercholesteremia   . Myocardial infarct, old   . GERD (gastroesophageal reflux disease)   . Arthritis   . Osteoarthritis   . Stroke   . Pneumonia     Past Surgical History  Procedure Date  . Coronary angioplasty with stent placement   . Tonsillectomy   . Post aortocoronary bypass surgery   . Coronary artery bypass graft   . Coronary stent  placement   . Coronary angioplasty     No family history on file.  History  Substance Use Topics  . Smoking status: Former Smoker    Types: Cigarettes  . Smokeless tobacco: Current User    Types: Chew  . Alcohol Use: No      Review of Systems  All other systems reviewed and are negative.    Allergies  Review of patient's allergies indicates no known allergies.  Home Medications   Current Outpatient Rx  Name Route Sig Dispense Refill  . ALBUTEROL SULFATE HFA 108 (90 BASE) MCG/ACT IN AERS Inhalation Inhale 1-2 puffs into the lungs every 6 (six) hours as needed. 1 Inhaler 6  . ASPIRIN 325 MG PO TABS Oral Take 325 mg by mouth daily.    . ASPIRIN EFFERVESCENT 325 MG PO TBEF Oral Take 325 mg by mouth daily as needed. For heartburn    . B-COMPLEX PO Oral Take 1 tablet by mouth daily.    Marland Kitchen PEPTO-BISMOL PO Oral Take by mouth daily as needed. Patient states that he just takes a couple of swallows when needed to control bowels.     Marland Kitchen CALCIUM CARBONATE ANTACID 500 MG PO CHEW Oral Chew 2 tablets by mouth daily as needed.    Marland Kitchen CARVEDILOL 6.25 MG PO TABS Oral Take 6.25 mg by mouth 2 (two) times daily with  a meal. For blood pressure/heart    . CLOPIDOGREL BISULFATE 75 MG PO TABS Oral Take 75 mg by mouth daily. Anti-Stroke    . DOCUSATE SODIUM 100 MG PO TABS Oral Take 100 mg by mouth at bedtime. Constipation    . ISOSORBIDE MONONITRATE ER 30 MG PO TB24  TAKE 1 TABLET BY MOUTH DAILY FOR HEART. 30 tablet 12  . METFORMIN HCL 500 MG PO TABS Oral Take 500 mg by mouth 2 (two) times daily.    . ADULT MULTIVITAMIN W/MINERALS CH Oral Take 1 tablet by mouth daily.    Marland Kitchen NITROGLYCERIN 0.4 MG SL SUBL Sublingual Place 1 tablet (0.4 mg total) under the tongue every 5 (five) minutes x 3 doses as needed for chest pain. 10 tablet 0  . PANTOPRAZOLE SODIUM 40 MG PO TBEC Oral Take 1 tablet (40 mg total) by mouth 2 (two) times daily.    Marland Kitchen RAMIPRIL 5 MG PO CAPS Oral Take 5 mg by mouth daily. For blood pressure      . ROSUVASTATIN CALCIUM 10 MG PO TABS Oral Take 10 mg by mouth daily.    Marland Kitchen SAXAGLIPTIN HCL 5 MG PO TABS Oral Take 5 mg by mouth at bedtime.     . TAMSULOSIN HCL 0.4 MG PO CAPS Oral Take 0.4 mg by mouth at bedtime. For urinary health    . TRAMADOL HCL 50 MG PO TABS Oral Take 50 mg by mouth every 8 (eight) hours as needed.      BP 173/82  Pulse 69  Temp 98.2 F (36.8 C) (Oral)  Resp 18  Ht 5\' 10"  (1.778 m)  Wt 180 lb (81.647 kg)  BMI 25.83 kg/m2  SpO2 98%  Physical Exam  Nursing note and vitals reviewed. Constitutional: He is oriented to person, place, and time. He appears well-developed and well-nourished.  HENT:  Head: Normocephalic and atraumatic.  Right Ear: External ear normal.  Left Ear: External ear normal.  Eyes: Conjunctivae and EOM are normal. Pupils are equal, round, and reactive to light.  Neck: Normal range of motion and phonation normal. Neck supple.  Cardiovascular: Normal rate, regular rhythm, normal heart sounds and intact distal pulses.   Pulmonary/Chest: Effort normal and breath sounds normal. He exhibits no bony tenderness.  Abdominal: Soft. Normal appearance. There is no tenderness.  Musculoskeletal: Normal range of motion. He exhibits no edema and no tenderness.  Neurological: He is alert and oriented to person, place, and time. He has normal strength. No cranial nerve deficit or sensory deficit. He exhibits normal muscle tone. Coordination normal.       Somewhat hard of hearing  Skin: Skin is warm, dry and intact.  Psychiatric: He has a normal mood and affect. His behavior is normal. Judgment and thought content normal.    ED Course  Procedures (including critical care time)    Date: 02/28/2012  Rate: 73  Rhythm: normal sinus rhythm  QRS Axis: normal  Intervals: normal  ST/T Wave abnormalities: normal  Conduction Disutrbances:none  Narrative Interpretation:   Old EKG Reviewed: changes noted- inf. Q wave resolved   Reeval: 19:00- no recurrence of  symptoms. He is calm, and comfortable. Repeat vital signs are normal and reassuring   Labs Reviewed  CBC WITH DIFFERENTIAL - Abnormal; Notable for the following:    RBC 3.87 (*)     Hemoglobin 11.5 (*)     HCT 34.2 (*)     Platelets 147 (*)     All other components within normal limits  BASIC METABOLIC PANEL - Abnormal; Notable for the following:    Glucose, Bld 219 (*)     GFR calc non Af Amer 79 (*)     All other components within normal limits  TROPONIN I  TROPONIN I   Dg Chest 2 View  02/28/2012  *RADIOLOGY REPORT*  Clinical Data: Chest pain.  CHEST - 2 VIEW  Comparison: Plain films of the chest 01/23/2012.  Findings: Heart size is normal.  The patient is status post CABG. Lungs are clear.  No pneumothorax or pleural fluid.  IMPRESSION: No acute disease.  Original Report Authenticated By: Bernadene Bell. D'ALESSIO, M.D.     1. Gastritis       MDM  Atypical for cardiac chest pain, with recurrent  sx of gastritis. Troponin, trended, is normal. Doubt ACS, PE, pneumonia, perforated ulcer, or acute intra-abdominal process.Doubt metabolic instability, serious bacterial infection or impending vascular collapse; the patient is stable for discharge.   Plan: Home Medications- usual, include regular antacid; Home Treatments- rest; Recommended follow up- PCP in 3-4 days for check up       [name], MD 02/28/12 1908

## 2012-02-28 NOTE — ED Notes (Signed)
Pt c/o central cp today after lunch. Pt had 2 sl nitro and 324mg  asa pta. Pain free at this time.

## 2012-02-28 NOTE — ED Notes (Signed)
Pt stable at discharge with steady and ambulatory gait

## 2012-02-28 NOTE — ED Notes (Signed)
Grand daughter Jeffrey [name] to call for ride home [phone]

## 2012-02-28 NOTE — ED Notes (Signed)
Pt given a diet coke. 

## 2012-02-29 ENCOUNTER — Emergency Department (HOSPITAL_COMMUNITY)
Admission: EM | Admit: 2012-02-29 | Discharge: 2012-02-29 | Disposition: A | Discharge status: Home / Self Care | Payer: Medicare Other | Attending: Emergency Medicine | Admitting: Emergency Medicine

## 2012-02-29 ENCOUNTER — Encounter (HOSPITAL_COMMUNITY): Payer: Self-pay | Admitting: *Deleted

## 2012-02-29 DIAGNOSIS — Z87891 Personal history of nicotine dependence: Secondary | ICD-10-CM | POA: Insufficient documentation

## 2012-02-29 DIAGNOSIS — I2510 Atherosclerotic heart disease of native coronary artery without angina pectoris: Secondary | ICD-10-CM | POA: Insufficient documentation

## 2012-02-29 DIAGNOSIS — Z951 Presence of aortocoronary bypass graft: Secondary | ICD-10-CM | POA: Insufficient documentation

## 2012-02-29 DIAGNOSIS — Z8673 Personal history of transient ischemic attack (TIA), and cerebral infarction without residual deficits: Secondary | ICD-10-CM | POA: Insufficient documentation

## 2012-02-29 DIAGNOSIS — I252 Old myocardial infarction: Secondary | ICD-10-CM | POA: Insufficient documentation

## 2012-02-29 DIAGNOSIS — I10 Essential (primary) hypertension: Secondary | ICD-10-CM | POA: Insufficient documentation

## 2012-02-29 DIAGNOSIS — K219 Gastro-esophageal reflux disease without esophagitis: Secondary | ICD-10-CM | POA: Insufficient documentation

## 2012-02-29 DIAGNOSIS — Z7982 Long term (current) use of aspirin: Secondary | ICD-10-CM | POA: Insufficient documentation

## 2012-02-29 DIAGNOSIS — M1990 Unspecified osteoarthritis, unspecified site: Secondary | ICD-10-CM | POA: Insufficient documentation

## 2012-02-29 DIAGNOSIS — K279 Peptic ulcer, site unspecified, unspecified as acute or chronic, without hemorrhage or perforation: Secondary | ICD-10-CM | POA: Insufficient documentation

## 2012-02-29 DIAGNOSIS — E119 Type 2 diabetes mellitus without complications: Secondary | ICD-10-CM | POA: Insufficient documentation

## 2012-02-29 DIAGNOSIS — E7800 Pure hypercholesterolemia, unspecified: Secondary | ICD-10-CM | POA: Insufficient documentation

## 2012-02-29 LAB — URINALYSIS, ROUTINE W REFLEX MICROSCOPIC
Bilirubin Urine: NEGATIVE
Ketones, ur: NEGATIVE mg/dL
Nitrite: NEGATIVE
pH: 7.5 (ref 5.0–8.0)

## 2012-02-29 NOTE — ED Notes (Signed)
Pt c/o high blood pressure. States that he was seen last night in ED for chest pain. Pt states that he wants to make sure his blood pressure if okay.

## 2012-02-29 NOTE — ED Provider Notes (Signed)
History    This chart was scribed for Jeffrey Melter, MD, MD by Smitty Pluck. The patient was seen in room APA09 and the patient's care was started at 2:38PM.   CSN: [id number]  Arrival date & time 02/29/12  1400   First MD Initiated Contact with Patient 02/29/12 1434      Chief Complaint  Patient presents with  . Hypertension    (Consider location/radiation/quality/duration/timing/severity/associated sxs/prior treatment) The history is provided by the patient.   Jeffrey [name] is a 78 y.o. male who presents to the Emergency Department complaining of hypertension with BP of 188/90. Pt reports that he wants to make sure his BP is okay. Pt was seen last night in ED for chest pain. He reports that he has used medication recommended and the chest pain has subsided. Denies any other pain.   Past Medical History  Diagnosis Date  . Diabetes mellitus   . Hypercholesterolemia   . Coronary artery disease     a.  s/p CABG x 3 1989, VG->LCX, VG->RCA, LIMA->LAD;  b. 05/24/10 Cath/PCI: 3vd, LIMA patent, VG->RCA occluded, VG->LCX 90 in graft (3.0x15 promus) & 90 in distal LCX (2.75x15 Promus);  c. NSTEMI in 04/2011 - Cath occlusion of distal LCX stent.   . Coronary artery disease     a.  s/p CABG 1999;  b. s/p stenting to distal LCx, s/p stent to proximal SVG-LCx in 05/2010;  c. NSTEMI in 04/2011 with cath showing occluded OM2 graft   . Diabetes mellitus   . Hypertension   . PUD (peptic ulcer disease)   . Hypercholesteremia   . Myocardial infarct, old   . GERD (gastroesophageal reflux disease)   . Arthritis   . Osteoarthritis   . Stroke   . Pneumonia     Past Surgical History  Procedure Date  . Coronary angioplasty with stent placement   . Tonsillectomy   . Post aortocoronary bypass surgery   . Coronary artery bypass graft   . Coronary stent placement   . Coronary angioplasty     History reviewed. No pertinent family history.  History  Substance Use Topics  . Smoking status:  Former Smoker    Types: Cigarettes  . Smokeless tobacco: Current User    Types: Chew  . Alcohol Use: No      Review of Systems  All other systems reviewed and are negative.   10 Systems reviewed and all are negative for acute change except as noted in the HPI.   Allergies  Review of patient's allergies indicates no known allergies.  Home Medications   Current Outpatient Rx  Name Route Sig Dispense Refill  . ALBUTEROL SULFATE HFA 108 (90 BASE) MCG/ACT IN AERS Inhalation Inhale 1-2 puffs into the lungs every 6 (six) hours as needed. 1 Inhaler 6  . ASPIRIN 325 MG PO TABS Oral Take 325 mg by mouth daily.    . ASPIRIN EFFERVESCENT 325 MG PO TBEF Oral Take 325 mg by mouth daily as needed. For heartburn    . B-COMPLEX PO Oral Take 1 tablet by mouth daily.    Marland Kitchen PEPTO-BISMOL PO Oral Take by mouth daily as needed. Patient states that he just takes a couple of swallows when needed to control bowels.     Marland Kitchen CALCIUM CARBONATE ANTACID 500 MG PO CHEW Oral Chew 2 tablets by mouth daily as needed.    Marland Kitchen CARVEDILOL 6.25 MG PO TABS Oral Take 6.25 mg by mouth 2 (two) times daily with a meal.  For blood pressure/heart    . CLOPIDOGREL BISULFATE 75 MG PO TABS Oral Take 75 mg by mouth daily. Anti-Stroke    . DOCUSATE SODIUM 100 MG PO TABS Oral Take 100 mg by mouth at bedtime. Constipation    . ISOSORBIDE MONONITRATE ER 30 MG PO TB24  TAKE 1 TABLET BY MOUTH DAILY FOR HEART. 30 tablet 12  . METFORMIN HCL 500 MG PO TABS Oral Take 500 mg by mouth 2 (two) times daily.    . ADULT MULTIVITAMIN W/MINERALS CH Oral Take 1 tablet by mouth daily.    Marland Kitchen NITROGLYCERIN 0.4 MG SL SUBL Sublingual Place 1 tablet (0.4 mg total) under the tongue every 5 (five) minutes x 3 doses as needed for chest pain. 10 tablet 0  . PANTOPRAZOLE SODIUM 40 MG PO TBEC Oral Take 1 tablet (40 mg total) by mouth 2 (two) times daily.    Marland Kitchen RAMIPRIL 5 MG PO CAPS Oral Take 5 mg by mouth daily. For blood pressure    . ROSUVASTATIN CALCIUM 10 MG PO  TABS Oral Take 10 mg by mouth daily.    Marland Kitchen SAXAGLIPTIN HCL 5 MG PO TABS Oral Take 5 mg by mouth at bedtime.     . TAMSULOSIN HCL 0.4 MG PO CAPS Oral Take 0.4 mg by mouth at bedtime. For urinary health    . TRAMADOL HCL 50 MG PO TABS Oral Take 50 mg by mouth every 8 (eight) hours as needed.      BP 170/85  Pulse 77  Temp 98.2 F (36.8 C) (Oral)  Resp 18  Ht 5\' 10"  (1.778 m)  Wt 180 lb (81.647 kg)  BMI 25.83 kg/m2  SpO2 98%  Physical Exam  Nursing note and vitals reviewed. Constitutional: He is oriented to person, place, and time. He appears well-developed and well-nourished.  HENT:  Head: Normocephalic and atraumatic.  Eyes: Conjunctivae are normal. Pupils are equal, round, and reactive to light.  Cardiovascular: Normal rate, regular rhythm and normal heart sounds.   Pulmonary/Chest: Effort normal. No respiratory distress.  Abdominal: Soft. He exhibits no distension.  Neurological: He is alert and oriented to person, place, and time.  Skin: Skin is warm and dry.  Psychiatric: He has a normal mood and affect. His behavior is normal.    ED Course  Procedures (including critical care time) DIAGNOSTIC STUDIES: Oxygen Saturation is 98% on room air, normal by my interpretation.    COORDINATION OF CARE: 2:42PM Discussed pt ED treatment with pt  3:18PM Rechecked. Discussed lab results. Recommended checking with PCP for follow up.   Labs Reviewed  URINALYSIS, ROUTINE W REFLEX MICROSCOPIC  URINE CULTURE   Dg Chest 2 View  02/28/2012  *RADIOLOGY REPORT*  Clinical Data: Chest pain.  CHEST - 2 VIEW  Comparison: Plain films of the chest 01/23/2012.  Findings: Heart size is normal.  The patient is status post CABG. Lungs are clear.  No pneumothorax or pleural fluid.  IMPRESSION: No acute disease.  Original Report Authenticated By: Bernadene Bell. D'ALESSIO, M.D.     1. Hypertension       MDM  Mild, hypertension, without complicating features. Patient does not have chest pain, or  abdominal pain today. Doubt metabolic instability, serious bacterial infection or impending vascular collapse; the patient is stable for discharge.   I personally performed the services described in this documentation, which was scribed in my presence. The recorded information has been reviewed and considered.    Plan: Home Medications- usual; Home Treatments- rest; Recommended follow  up- PCP for check up in 3-4 days      Jeffrey Melter, MD 02/29/12 2200

## 2012-03-02 LAB — URINE CULTURE

## 2012-03-03 ENCOUNTER — Encounter (HOSPITAL_COMMUNITY): Payer: Self-pay | Admitting: Emergency Medicine

## 2012-03-03 ENCOUNTER — Emergency Department (HOSPITAL_COMMUNITY)
Admission: EM | Admit: 2012-03-03 | Discharge: 2012-03-03 | Disposition: A | Discharge status: Home / Self Care | Payer: Medicare Other | Attending: Emergency Medicine | Admitting: Emergency Medicine

## 2012-03-03 DIAGNOSIS — R079 Chest pain, unspecified: Secondary | ICD-10-CM | POA: Insufficient documentation

## 2012-03-03 DIAGNOSIS — Z87891 Personal history of nicotine dependence: Secondary | ICD-10-CM | POA: Insufficient documentation

## 2012-03-03 DIAGNOSIS — I10 Essential (primary) hypertension: Secondary | ICD-10-CM | POA: Insufficient documentation

## 2012-03-03 DIAGNOSIS — Z8673 Personal history of transient ischemic attack (TIA), and cerebral infarction without residual deficits: Secondary | ICD-10-CM | POA: Insufficient documentation

## 2012-03-03 DIAGNOSIS — E7800 Pure hypercholesterolemia, unspecified: Secondary | ICD-10-CM | POA: Insufficient documentation

## 2012-03-03 DIAGNOSIS — Z9861 Coronary angioplasty status: Secondary | ICD-10-CM | POA: Insufficient documentation

## 2012-03-03 DIAGNOSIS — I2510 Atherosclerotic heart disease of native coronary artery without angina pectoris: Secondary | ICD-10-CM | POA: Insufficient documentation

## 2012-03-03 DIAGNOSIS — K219 Gastro-esophageal reflux disease without esophagitis: Secondary | ICD-10-CM | POA: Insufficient documentation

## 2012-03-03 DIAGNOSIS — E119 Type 2 diabetes mellitus without complications: Secondary | ICD-10-CM | POA: Insufficient documentation

## 2012-03-03 DIAGNOSIS — Z79899 Other long term (current) drug therapy: Secondary | ICD-10-CM | POA: Insufficient documentation

## 2012-03-03 DIAGNOSIS — M1990 Unspecified osteoarthritis, unspecified site: Secondary | ICD-10-CM | POA: Insufficient documentation

## 2012-03-03 DIAGNOSIS — Z951 Presence of aortocoronary bypass graft: Secondary | ICD-10-CM | POA: Insufficient documentation

## 2012-03-03 LAB — BASIC METABOLIC PANEL
CO2: 25 mEq/L (ref 19–32)
Chloride: 101 mEq/L (ref 96–112)
Glucose, Bld: 160 mg/dL — ABNORMAL HIGH (ref 70–99)
Potassium: 4.2 mEq/L (ref 3.5–5.1)
Sodium: 135 mEq/L (ref 135–145)

## 2012-03-03 LAB — CBC WITH DIFFERENTIAL/PLATELET
Basophils Absolute: 0 10*3/uL (ref 0.0–0.1)
Eosinophils Relative: 3 % (ref 0–5)
Lymphocytes Relative: 21 % (ref 12–46)
Lymphs Abs: 1.5 10*3/uL (ref 0.7–4.0)
MCV: 88.4 fL (ref 78.0–100.0)
Neutro Abs: 4.7 10*3/uL (ref 1.7–7.7)
Neutrophils Relative %: 68 % (ref 43–77)
Platelets: 146 10*3/uL — ABNORMAL LOW (ref 150–400)
RBC: 4.06 MIL/uL — ABNORMAL LOW (ref 4.22–5.81)
RDW: 13.3 % (ref 11.5–15.5)
WBC: 7 10*3/uL (ref 4.0–10.5)

## 2012-03-03 LAB — TROPONIN I: Troponin I: <0.3 ng/mL (ref <0.30)

## 2012-03-03 NOTE — ED Notes (Addendum)
Pt states rt chest started hurting yesterday intermittent. No n/v/d. No sob, no apparent distress, no dizziness, non diaphoretic.

## 2012-03-03 NOTE — ED Provider Notes (Signed)
History   This chart was scribed for [name], MD by Melba Coon. The patient was seen in room APA09/APA09 and the patient's care was started at 3:23PM.    CSN: [id number]  Arrival date & time 03/03/12  1444   First MD Initiated Contact with Patient 03/03/12 1504      Chief Complaint  Patient presents with  . Chest Pain    (Consider location/radiation/quality/duration/timing/severity/associated sxs/prior treatment) The history is provided by the patient. No language interpreter was used.   Jeffrey [name] is a 78 y.o. male who presents to the Emergency Department complaining of constant, moderate to severe, right-sided chest pain with an onset yesterday. Pt states that the pain feels like a "bubble in his lung". Pt states it doesn't feel like a heart attack. Pt also states that he tries to stay active. Slight SOB present. No HA, diaphoresis fever, neck pain, sore throat, rash, back pain, abd pain, n/v/d, dysuria, or extremity pain, edema, weakness, numbness, or tingling. Pt had 3 heart attacks last year and heart stents placed. No known allergies. No other pertinent medical symptoms.  PCP: Robbie Lis  Past Medical History  Diagnosis Date  . Diabetes mellitus   . Hypercholesterolemia   . Coronary artery disease     a.  s/p CABG x 3 1989, VG->LCX, VG->RCA, LIMA->LAD;  b. 05/24/10 Cath/PCI: 3vd, LIMA patent, VG->RCA occluded, VG->LCX 90 in graft (3.0x15 promus) & 90 in distal LCX (2.75x15 Promus);  c. NSTEMI in 04/2011 - Cath occlusion of distal LCX stent.   . Coronary artery disease     a.  s/p CABG 1999;  b. s/p stenting to distal LCx, s/p stent to proximal SVG-LCx in 05/2010;  c. NSTEMI in 04/2011 with cath showing occluded OM2 graft   . Diabetes mellitus   . Hypertension   . PUD (peptic ulcer disease)   . Hypercholesteremia   . Myocardial infarct, old   . GERD (gastroesophageal reflux disease)   . Arthritis   . Osteoarthritis   . Stroke   . Pneumonia     Past Surgical  History  Procedure Date  . Coronary angioplasty with stent placement   . Tonsillectomy   . Post aortocoronary bypass surgery   . Coronary artery bypass graft   . Coronary stent placement   . Coronary angioplasty     History reviewed. No pertinent family history.  History  Substance Use Topics  . Smoking status: Former Smoker    Types: Cigarettes  . Smokeless tobacco: Current User    Types: Chew  . Alcohol Use: No      Review of Systems  Cardiovascular: Positive for chest pain.   10 Systems reviewed and all are negative for acute change except as noted in the HPI.   Allergies  Review of patient's allergies indicates no known allergies.  Home Medications   Current Outpatient Rx  Name Route Sig Dispense Refill  . ALBUTEROL SULFATE HFA 108 (90 BASE) MCG/ACT IN AERS Inhalation Inhale 1-2 puffs into the lungs every 6 (six) hours as needed. 1 Inhaler 6  . ASPIRIN 325 MG PO TABS Oral Take 325 mg by mouth 2 (two) times daily as needed. For HEART/BLOOD THINNER/PAIN    . B-COMPLEX PO Oral Take 1 tablet by mouth every morning.     Marland Kitchen CALCIUM CARBONATE ANTACID 500 MG PO CHEW Oral Chew 1 tablet by mouth 4 (four) times daily - after meals and at bedtime.    Marland Kitchen CARVEDILOL 6.25 MG PO TABS Oral  Take 6.25 mg by mouth 2 (two) times daily with a meal. For blood pressure/heart    . CLOPIDOGREL BISULFATE 75 MG PO TABS Oral Take 75 mg by mouth every morning. Anti-Stroke    . DOCUSATE SODIUM 100 MG PO TABS Oral Take 100 mg by mouth at bedtime. Constipation    . ESCITALOPRAM OXALATE 5 MG PO TABS Oral Take 5 mg by mouth daily. For NERVES/Depression    . ISOSORBIDE MONONITRATE ER 30 MG PO TB24  TAKE 1 TABLET BY MOUTH DAILY FOR HEART. 30 tablet 12  . MECLIZINE HCL 25 MG PO TABS Oral Take 25 mg by mouth daily as needed. For dizziness    . METFORMIN HCL 500 MG PO TABS Oral Take 500 mg by mouth 2 (two) times daily.    . ADULT MULTIVITAMIN W/MINERALS CH Oral Take 1 tablet by mouth daily.    Marland Kitchen  NITROGLYCERIN 0.4 MG SL SUBL Sublingual Place 1 tablet (0.4 mg total) under the tongue every 5 (five) minutes x 3 doses as needed for chest pain. 10 tablet 0  . PANTOPRAZOLE SODIUM 40 MG PO TBEC Oral Take 40 mg by mouth every morning.    Marland Kitchen RAMIPRIL 5 MG PO CAPS Oral Take 5 mg by mouth daily. For blood pressure    . ROSUVASTATIN CALCIUM 10 MG PO TABS Oral Take 10 mg by mouth daily.    Marland Kitchen TAMSULOSIN HCL 0.4 MG PO CAPS Oral Take 0.4 mg by mouth at bedtime. For urinary health    . PEPTO-BISMOL PO Oral Take by mouth daily as needed. Patient states that he just takes a couple of swallows when needed to control bowels.     Marland Kitchen SAXAGLIPTIN HCL 5 MG PO TABS Oral Take 5 mg by mouth at bedtime.     . TRAMADOL HCL 50 MG PO TABS Oral Take 50 mg by mouth every 8 (eight) hours as needed.      BP 152/74  Pulse 75  Temp 99.3 F (37.4 C) (Oral)  Resp 19  Ht 5\' 10"  (1.778 m)  Wt 180 lb (81.647 kg)  BMI 25.83 kg/m2  SpO2 99%  Physical Exam  Nursing note and vitals reviewed. Constitutional: He is oriented to person, place, and time. He appears well-developed and well-nourished.  HENT:  Head: Normocephalic and atraumatic.  Eyes: EOM are normal. Pupils are equal, round, and reactive to light.  Neck: Normal range of motion. Neck supple.  Cardiovascular: Normal rate, normal heart sounds and intact distal pulses.   Pulmonary/Chest: Effort normal and breath sounds normal.  Abdominal: Bowel sounds are normal. He exhibits no distension. There is no tenderness.  Musculoskeletal: Normal range of motion. He exhibits no edema and no tenderness.  Neurological: He is alert and oriented to person, place, and time. He has normal strength. No cranial nerve deficit or sensory deficit.  Skin: Skin is warm and dry. No rash noted.  Psychiatric: He has a normal mood and affect.    ED Course  Procedures (including critical care time)  DIAGNOSTIC STUDIES: Oxygen Saturation is 99% on room air, normal by my interpretation.      COORDINATION OF CARE:  3:27PM - blood w/u and EKG will be ordered for the pt.   Labs Reviewed  CBC WITH DIFFERENTIAL - Abnormal; Notable for the following:    RBC 4.06 (*)     Hemoglobin 12.0 (*)     HCT 35.9 (*)     Platelets 146 (*)     All other components  within normal limits  BASIC METABOLIC PANEL - Abnormal; Notable for the following:    Glucose, Bld 160 (*)     GFR calc non Af Amer 78 (*)     All other components within normal limits  TROPONIN I   No results found.   No diagnosis found.   Date: 03/03/2012  Rate: 79 Rhythm: normal sinus rhythm  QRS Axis: normal  Intervals: normal  ST/T Wave abnormalities: normal  Conduction Disutrbances:none  Narrative Interpretation:   Old EKG Reviewed: changes noted PVC  MDM  Patient looks well. Color good. EKG, cardiac enzymes negative  I personally performed the services described in this documentation, which was scribed in my presence. The recorded information has been reviewed and considered.       [name], MD 03/03/12 1704

## 2012-03-03 NOTE — ED Notes (Signed)
Discharge instructions reveiwed

## 2012-03-05 ENCOUNTER — Ambulatory Visit: Payer: Self-pay | Admitting: Adult Health

## 2012-03-09 ENCOUNTER — Inpatient Hospital Stay (HOSPITAL_COMMUNITY)
Admission: EM | Admit: 2012-03-09 | Discharge: 2012-03-12 | DRG: 247 | Disposition: A | Discharge status: Home / Self Care | Payer: Medicare Other | Attending: Internal Medicine | Admitting: Internal Medicine

## 2012-03-09 ENCOUNTER — Encounter (HOSPITAL_COMMUNITY): Payer: Self-pay | Admitting: Emergency Medicine

## 2012-03-09 ENCOUNTER — Emergency Department (HOSPITAL_COMMUNITY): Payer: Medicare Other

## 2012-03-09 DIAGNOSIS — R42 Dizziness and giddiness: Secondary | ICD-10-CM | POA: Diagnosis present

## 2012-03-09 DIAGNOSIS — K219 Gastro-esophageal reflux disease without esophagitis: Secondary | ICD-10-CM | POA: Diagnosis present

## 2012-03-09 DIAGNOSIS — T82897A Other specified complication of cardiac prosthetic devices, implants and grafts, initial encounter: Secondary | ICD-10-CM | POA: Diagnosis present

## 2012-03-09 DIAGNOSIS — IMO0001 Reserved for inherently not codable concepts without codable children: Secondary | ICD-10-CM | POA: Diagnosis present

## 2012-03-09 DIAGNOSIS — I252 Old myocardial infarction: Secondary | ICD-10-CM

## 2012-03-09 DIAGNOSIS — F0390 Unspecified dementia without behavioral disturbance: Secondary | ICD-10-CM | POA: Diagnosis present

## 2012-03-09 DIAGNOSIS — M1990 Unspecified osteoarthritis, unspecified site: Secondary | ICD-10-CM | POA: Diagnosis present

## 2012-03-09 DIAGNOSIS — I10 Essential (primary) hypertension: Secondary | ICD-10-CM | POA: Diagnosis present

## 2012-03-09 DIAGNOSIS — Y849 Medical procedure, unspecified as the cause of abnormal reaction of the patient, or of later complication, without mention of misadventure at the time of the procedure: Secondary | ICD-10-CM | POA: Diagnosis present

## 2012-03-09 DIAGNOSIS — Z7982 Long term (current) use of aspirin: Secondary | ICD-10-CM

## 2012-03-09 DIAGNOSIS — I2510 Atherosclerotic heart disease of native coronary artery without angina pectoris: Secondary | ICD-10-CM | POA: Diagnosis present

## 2012-03-09 DIAGNOSIS — Z79899 Other long term (current) drug therapy: Secondary | ICD-10-CM

## 2012-03-09 DIAGNOSIS — Z8673 Personal history of transient ischemic attack (TIA), and cerebral infarction without residual deficits: Secondary | ICD-10-CM

## 2012-03-09 DIAGNOSIS — E785 Hyperlipidemia, unspecified: Secondary | ICD-10-CM | POA: Diagnosis present

## 2012-03-09 DIAGNOSIS — I2582 Chronic total occlusion of coronary artery: Secondary | ICD-10-CM | POA: Diagnosis present

## 2012-03-09 DIAGNOSIS — I25810 Atherosclerosis of coronary artery bypass graft(s) without angina pectoris: Secondary | ICD-10-CM | POA: Diagnosis present

## 2012-03-09 DIAGNOSIS — I214 Non-ST elevation (NSTEMI) myocardial infarction: Principal | ICD-10-CM | POA: Diagnosis present

## 2012-03-09 DIAGNOSIS — E782 Mixed hyperlipidemia: Secondary | ICD-10-CM | POA: Diagnosis present

## 2012-03-09 HISTORY — DX: Personal history of pneumonia (recurrent): Z87.01

## 2012-03-09 LAB — CBC WITH DIFFERENTIAL/PLATELET
Basophils Relative: 0 % (ref 0–1)
Eosinophils Absolute: 0.3 10*3/uL (ref 0.0–0.7)
Eosinophils Relative: 4 % (ref 0–5)
Hemoglobin: 12.2 g/dL — ABNORMAL LOW (ref 13.0–17.0)
Lymphs Abs: 1.7 10*3/uL (ref 0.7–4.0)
MCH: 29.3 pg (ref 26.0–34.0)
MCHC: 33.3 g/dL (ref 30.0–36.0)
MCV: 87.8 fL (ref 78.0–100.0)
Monocytes Absolute: 0.7 10*3/uL (ref 0.1–1.0)
Monocytes Relative: 11 % (ref 3–12)
Neutrophils Relative %: 61 % (ref 43–77)
RBC: 4.17 MIL/uL — ABNORMAL LOW (ref 4.22–5.81)

## 2012-03-09 LAB — BASIC METABOLIC PANEL
CO2: 24 mEq/L (ref 19–32)
Calcium: 9.6 mg/dL (ref 8.4–10.5)
Chloride: 98 mEq/L (ref 96–112)
Creatinine, Ser: 0.94 mg/dL (ref 0.50–1.35)
Glucose, Bld: 195 mg/dL — ABNORMAL HIGH (ref 70–99)

## 2012-03-09 LAB — GLUCOSE, CAPILLARY: Glucose-Capillary: 112 mg/dL — ABNORMAL HIGH (ref 70–99)

## 2012-03-09 LAB — CBC
HCT: 37.3 % — ABNORMAL LOW (ref 39.0–52.0)
Hemoglobin: 12.5 g/dL — ABNORMAL LOW (ref 13.0–17.0)
MCH: 29.6 pg (ref 26.0–34.0)
MCV: 88.4 fL (ref 78.0–100.0)
Platelets: 177 10*3/uL (ref 150–400)
RBC: 4.22 MIL/uL (ref 4.22–5.81)
WBC: 7.5 10*3/uL (ref 4.0–10.5)

## 2012-03-09 LAB — HEPARIN LEVEL (UNFRACTIONATED): Heparin Unfractionated: 0.22 IU/mL — ABNORMAL LOW (ref 0.30–0.70)

## 2012-03-09 LAB — TROPONIN I: Troponin I: 0.59 ng/mL (ref <0.30)

## 2012-03-09 LAB — APTT: aPTT: 28 seconds (ref 24–37)

## 2012-03-09 LAB — HEPATIC FUNCTION PANEL
ALT: 23 U/L (ref 0–53)
AST: 25 U/L (ref 0–37)
Alkaline Phosphatase: 68 U/L (ref 39–117)
Bilirubin, Direct: <0.1 mg/dL (ref 0.0–0.3)

## 2012-03-09 LAB — PROTIME-INR: INR: 0.97 (ref 0.00–1.49)

## 2012-03-09 MED ORDER — HEPARIN (PORCINE) IN NACL 100-0.45 UNIT/ML-% IJ SOLN
1250.0000 [IU]/h | INTRAMUSCULAR | Status: DC
  Administered 2012-03-09: 1000 [IU]/h via INTRAVENOUS
  Administered 2012-03-10: 1250 [IU]/h via INTRAVENOUS
  Filled 2012-03-09 (×3): qty 250

## 2012-03-09 MED ORDER — CARVEDILOL 6.25 MG PO TABS
6.2500 mg | ORAL_TABLET | Freq: Two times a day (BID) | ORAL | Status: DC
  Administered 2012-03-10 – 2012-03-12 (×5): 6.25 mg via ORAL
  Filled 2012-03-09 (×7): qty 1

## 2012-03-09 MED ORDER — TAMSULOSIN HCL 0.4 MG PO CAPS
0.4000 mg | ORAL_CAPSULE | Freq: Every day | ORAL | Status: DC
  Administered 2012-03-11: 0.4 mg via ORAL
  Filled 2012-03-09 (×4): qty 1

## 2012-03-09 MED ORDER — CALCIUM CARBONATE ANTACID 500 MG PO CHEW
1.0000 | CHEWABLE_TABLET | Freq: Three times a day (TID) | ORAL | Status: DC
  Administered 2012-03-10 – 2012-03-12 (×8): 200 mg via ORAL
  Filled 2012-03-09 (×13): qty 1

## 2012-03-09 MED ORDER — NITROGLYCERIN 0.4 MG SL SUBL: 0.4000 mg | SUBLINGUAL_TABLET | SUBLINGUAL | Status: DC | PRN

## 2012-03-09 MED ORDER — PANTOPRAZOLE SODIUM 40 MG PO TBEC
40.0000 mg | DELAYED_RELEASE_TABLET | Freq: Every day | ORAL | Status: DC
  Administered 2012-03-10 – 2012-03-12 (×3): 40 mg via ORAL
  Filled 2012-03-09 (×3): qty 1

## 2012-03-09 MED ORDER — LINAGLIPTIN 5 MG PO TABS
5.0000 mg | ORAL_TABLET | Freq: Every day | ORAL | Status: DC
  Administered 2012-03-11 – 2012-03-12 (×2): 5 mg via ORAL
  Filled 2012-03-09 (×4): qty 1

## 2012-03-09 MED ORDER — ESCITALOPRAM OXALATE 5 MG PO TABS
5.0000 mg | ORAL_TABLET | Freq: Every day | ORAL | Status: DC
  Administered 2012-03-10 – 2012-03-12 (×3): 5 mg via ORAL
  Filled 2012-03-09 (×3): qty 1

## 2012-03-09 MED ORDER — NITROGLYCERIN 2 % TD OINT
1.0000 [in_us] | TOPICAL_OINTMENT | Freq: Once | TRANSDERMAL | Status: AC
  Administered 2012-03-09: 1 [in_us] via TOPICAL
  Filled 2012-03-09: qty 1

## 2012-03-09 MED ORDER — CLOPIDOGREL BISULFATE 75 MG PO TABS
75.0000 mg | ORAL_TABLET | Freq: Every day | ORAL | Status: DC
  Administered 2012-03-10: 75 mg via ORAL
  Filled 2012-03-09: qty 1

## 2012-03-09 MED ORDER — ASPIRIN 81 MG PO CHEW
324.0000 mg | CHEWABLE_TABLET | Freq: Once | ORAL | Status: AC
  Administered 2012-03-09: 324 mg via ORAL
  Filled 2012-03-09: qty 4

## 2012-03-09 MED ORDER — HEPARIN BOLUS VIA INFUSION
4000.0000 [IU] | Freq: Once | INTRAVENOUS | Status: AC
  Administered 2012-03-09: 4000 [IU] via INTRAVENOUS

## 2012-03-09 MED ORDER — RAMIPRIL 5 MG PO CAPS
5.0000 mg | ORAL_CAPSULE | Freq: Every day | ORAL | Status: DC
  Administered 2012-03-10 – 2012-03-12 (×3): 5 mg via ORAL
  Filled 2012-03-09 (×3): qty 1

## 2012-03-09 MED ORDER — ONDANSETRON HCL 4 MG/2ML IJ SOLN: 4.0000 mg | Freq: Four times a day (QID) | INTRAMUSCULAR | Status: DC | PRN

## 2012-03-09 MED ORDER — ACETAMINOPHEN 325 MG PO TABS: 650.0000 mg | ORAL_TABLET | ORAL | Status: DC | PRN

## 2012-03-09 MED ORDER — NITROGLYCERIN IN D5W 200-5 MCG/ML-% IV SOLN
3.0000 ug/min | INTRAVENOUS | Status: DC
  Administered 2012-03-09: 5 ug/min via INTRAVENOUS
  Filled 2012-03-09: qty 250

## 2012-03-09 MED ORDER — SODIUM CHLORIDE 0.9 % IV SOLN: INTRAVENOUS | Status: DC

## 2012-03-09 MED ORDER — SODIUM CHLORIDE 0.9 % IV SOLN
Freq: Once | INTRAVENOUS | Status: AC
  Administered 2012-03-09: 20 mL/h via INTRAVENOUS

## 2012-03-09 MED ORDER — DOCUSATE SODIUM 100 MG PO TABS: 100.0000 mg | ORAL_TABLET | Freq: Every day | ORAL | Status: DC

## 2012-03-09 MED ORDER — ASPIRIN EC 81 MG PO TBEC
81.0000 mg | DELAYED_RELEASE_TABLET | Freq: Every day | ORAL | Status: DC
  Administered 2012-03-10 – 2012-03-12 (×3): 81 mg via ORAL
  Filled 2012-03-09 (×3): qty 1

## 2012-03-09 MED ORDER — ALBUTEROL SULFATE HFA 108 (90 BASE) MCG/ACT IN AERS
1.0000 | INHALATION_SPRAY | Freq: Four times a day (QID) | RESPIRATORY_TRACT | Status: DC | PRN
  Filled 2012-03-09: qty 6.7

## 2012-03-09 MED ORDER — NITROGLYCERIN 0.4 MG SL SUBL
0.4000 mg | SUBLINGUAL_TABLET | SUBLINGUAL | Status: DC | PRN
  Administered 2012-03-11 (×2): 0.4 mg via SUBLINGUAL

## 2012-03-09 MED ORDER — MECLIZINE HCL 25 MG PO TABS
25.0000 mg | ORAL_TABLET | Freq: Every day | ORAL | Status: DC
  Administered 2012-03-10 – 2012-03-12 (×3): 25 mg via ORAL
  Filled 2012-03-09 (×3): qty 1

## 2012-03-09 MED ORDER — ATORVASTATIN CALCIUM 20 MG PO TABS
20.0000 mg | ORAL_TABLET | Freq: Every day | ORAL | Status: DC
  Administered 2012-03-10 – 2012-03-11 (×2): 20 mg via ORAL
  Filled 2012-03-09 (×3): qty 1

## 2012-03-09 MED ORDER — ADULT MULTIVITAMIN W/MINERALS CH
1.0000 | ORAL_TABLET | Freq: Every day | ORAL | Status: DC
  Administered 2012-03-10 – 2012-03-12 (×3): 1 via ORAL
  Filled 2012-03-09 (×3): qty 1

## 2012-03-09 MED ORDER — TRAMADOL HCL 50 MG PO TABS
50.0000 mg | ORAL_TABLET | Freq: Three times a day (TID) | ORAL | Status: DC | PRN
  Filled 2012-03-09: qty 1

## 2012-03-09 MED ORDER — DOCUSATE SODIUM 100 MG PO CAPS
100.0000 mg | ORAL_CAPSULE | Freq: Every day | ORAL | Status: DC
  Administered 2012-03-11: 100 mg via ORAL
  Filled 2012-03-09 (×4): qty 1

## 2012-03-09 NOTE — ED Notes (Signed)
Pt states has "chest pain", is a burning pain. Pt refers to pain as epigastric area off abdominal cavity. Pt reports falling onto right knee and side. Pt has noted abrasion on said right knee. Pt ate a bowl of cerel and drove to" [employer]" to have a biscuit, pt states fell on concrete stairs. Denies SOB and diaphoresis. Pt is an 78 yr old that continues to drive, lives alone and prepares most of his meals for himself. Pt drove self to the ED. Pt is alert, oriented x 4, skin, pink, warm and dry to touch. Pt's side rails up x 2 per safety. No acute distress noted. EKG completed per protocol. Labs drawn and held, and saline lock initiated.

## 2012-03-09 NOTE — ED Notes (Signed)
Pt transported to Xray via stretcher

## 2012-03-09 NOTE — ED Notes (Signed)
Critical troponin of 0.594 called to Dr Charline Bills. Lynelle Doctor. Verbal order received to obtain PT,PTT. Lab notified of add on labs.

## 2012-03-09 NOTE — H&P (Addendum)
Admit date: 03/09/2012 Referring Physician Dr. Lynelle Doctor Primary Cardiologist Dr. Graciela Husbands Chief complaint/reason for admission:chest pain  HPI:   This is an 78yo WM with a history of CAD s/p CABG x 3 win 1989 and then PCI to SVG to OM1 and in distal limb to OM2 and then NSTEMI 04/2011 with cath showing occlusion of distal llimb OM2 stent at that time and was placed on medical management.  He has done well until the last few weeks.  He presented to the ER 8/17 with complaints of indigestion after eating a hot dog and took 2 TUMS and 2 NTG with resolution of CP.  The pain was 2/10 initially and lasted less than 30 mintues.  Workup was normal and he was sent home.  He presented again to the ER on 8/18 with elevated BP at 188/55mmHg.  He was treated and sent home since he had no further CP.  He presented again to the ER on 8/21 complaining of constant severe right sided of 24 hours duration which felt like a "bubble" in his chest.  He also noticed slight SOB.  He also had noticed that when he would go to [facility] to walk he was more fatigued than normal.  His workup was normal and he was sent home.  This am he had a fall on his porch when he missed a step and fell.  He immediately felt burning chest pain lasting about 30 minutes.  He fell on the concrete on the right side of his body but denied any pain or injury and no LOC.  He states the discomfort in his chest today is different from what he has been having for several weeks.  He also complains of worsening exertional fatigue and SOB.  Currently he is pain free.    PMH:    Past Medical History  Diagnosis Date  . Diabetes mellitus   . Hypercholesterolemia   . Coronary artery disease     a.  s/p CABG x 3 1989, VG->OM1and OM2 seq, VG->RCA, LIMA->LAD;  b. 05/24/10 Cath/PCI: 3vd, LIMA patent, VG->RCA occluded, VG->OM1/OM2 proximal 90 in graft (3.0x15 promus) & 90 in distal Limb to OM2 (2.75x15 Promus);  c. NSTEMI in 04/2011 - Cath occlusion of distal limb to OM2 in  the stent with retrograde filling by collateral from OM1 - medical management  . Coronary artery disease     a.  s/p CABG 1999;  b. s/p stenting to distal limb of SVG to OM2 , s/p stent to proximal SVG-OM1 in 05/2010;  c. NSTEMI in 04/2011 with cath showing occluded OM2 graft   . Diabetes mellitus   . Hypertension   . PUD (peptic ulcer disease)   . Hypercholesteremia   . Myocardial infarct, old   . GERD (gastroesophageal reflux disease)   . Arthritis   . Osteoarthritis   . Stroke   . Pneumonia     PSH:    Past Surgical History  Procedure Date  . Coronary angioplasty with stent placement   . Tonsillectomy   . Post aortocoronary bypass surgery   . Coronary artery bypass graft   . Coronary stent placement   . Coronary angioplasty     ALLERGIES:   Review of patient's allergies indicates no known allergies.  Prior to Admit Meds:   Prescriptions prior to admission  Medication Sig Dispense Refill  . albuterol (PROAIR HFA) 108 (90 BASE) MCG/ACT inhaler Inhale 1-2 puffs into the lungs every 6 (six) hours as needed.  1 Inhaler  6  . aspirin 325 MG tablet Take 325 mg by mouth 2 (two) times daily as needed. For HEART/BLOOD THINNER/PAIN      . B Complex-Biotin-FA (B-COMPLEX PO) Take 1 tablet by mouth daily.       . Bismuth Subsalicylate (PEPTO-BISMOL PO) Take by mouth daily as needed. Patient states that he just takes a couple of swallows when needed to control bowels.       . calcium carbonate (TUMS - DOSED IN MG ELEMENTAL CALCIUM) 500 MG chewable tablet Chew 1 tablet by mouth 4 (four) times daily - after meals and at bedtime.      . carvedilol (COREG) 6.25 MG tablet Take 6.25 mg by mouth 2 (two) times daily with a meal. For blood pressure/heart      . clopidogrel (PLAVIX) 75 MG tablet Take 75 mg by mouth daily. Anti-Stroke      . Docusate Sodium (STOOL SOFTENER) 100 MG capsule Take 100 mg by mouth at bedtime. Constipation      . escitalopram (LEXAPRO) 5 MG tablet Take 5 mg by mouth daily.  For NERVES/Depression      . isosorbide mononitrate (IMDUR) 30 MG 24 hr tablet TAKE 1 TABLET BY MOUTH DAILY FOR HEART.  30 tablet  12  . meclizine (ANTIVERT) 25 MG tablet Take 25 mg by mouth daily. For dizziness      . metFORMIN (GLUCOPHAGE) 500 MG tablet Take 500 mg by mouth 2 (two) times daily.      . Multiple Vitamin (MULTIVITAMIN WITH MINERALS) TABS Take 1 tablet by mouth daily.      . pantoprazole (PROTONIX) 40 MG tablet Take 40 mg by mouth every morning.      . ramipril (ALTACE) 5 MG capsule Take 5 mg by mouth daily. For blood pressure      . rosuvastatin (CRESTOR) 10 MG tablet Take 10 mg by mouth daily.      . saxagliptin HCl (ONGLYZA) 5 MG TABS tablet Take 5 mg by mouth at bedtime.       . Tamsulosin HCl (FLOMAX) 0.4 MG CAPS Take 0.4 mg by mouth at bedtime. For urinary health      . nitroGLYCERIN (NITROSTAT) 0.4 MG SL tablet Place 1 tablet (0.4 mg total) under the tongue every 5 (five) minutes x 3 doses as needed for chest pain.  10 tablet  0  . traMADol (ULTRAM) 50 MG tablet Take 50 mg by mouth every 8 (eight) hours as needed.       Family HX:   History reviewed. No pertinent family history. Social HX:    History   Social History  . Marital Status: Divorced    Spouse Name: N/A    Number of Children: N/A  . Years of Education: N/A   Occupational History  . retired     Visual merchandiser   Social History Main Topics  . Smoking status: Former Smoker    Types: Cigarettes  . Smokeless tobacco: Current User    Types: Chew  . Alcohol Use: No  . Drug Use: No  . Sexually Active: No   Other Topics Concern  . Not on file   Social History Narrative   ** Merged History Encounter **      ROS:  All 11 ROS were addressed and are negative except what is stated in the HPI  PHYSICAL EXAM Filed Vitals:   03/09/12 2100  BP: 132/91  Pulse: 73  Temp:   Resp: 22   General: Well developed, well nourished,  in no acute distress Head: Eyes PERRLA, No xanthomas.   Normal cephalic and  atramatic  Lungs:   Clear bilaterally to auscultation and percussion. Heart:   HRRR S1 S2 Pulses are 2+ & equal.            No carotid bruit. No JVD.  No abdominal bruits. No femoral bruits. Abdomen: Bowel sounds are positive, abdomen soft and non-tender without masses o Extremities:   No clubbing, cyanosis or edema.  DP +1 Neuro: Alert and oriented X 3. Psych:  Good affect, responds appropriately   Labs:   Lab Results  Component Value Date   WBC 7.5 03/09/2012   HGB 12.5* 03/09/2012   HCT 37.3* 03/09/2012   MCV 88.4 03/09/2012   PLT 177 03/09/2012    Lab 03/09/12 1140  NA 134*  K 4.6  CL 98  CO2 24  BUN 12  CREATININE 0.94  CALCIUM 9.6  PROT 6.7  BILITOT 0.3  ALKPHOS 68  ALT 23  AST 25  GLUCOSE 195*   Lab Results  Component Value Date   CKTOTAL 484* 01/24/2012   CKMB 16.6* 01/24/2012   TROPONINI 0.72* 03/09/2012   No results found for this basename: PTT   Lab Results  Component Value Date   INR 0.97 03/09/2012   INR 0.94 12/15/2011   INR 1.02 11/01/2011     Lab Results  Component Value Date   CHOL 119 11/01/2011   CHOL 162 10/21/2011   CHOL 242* 03/06/2011   Lab Results  Component Value Date   HDL 35* 11/01/2011   HDL 33* 10/21/2011   HDL 41 1/61/0960   Lab Results  Component Value Date   LDLCALC 58 11/01/2011   LDLCALC 59 10/21/2011   LDLCALC Comment:   Not calculated due to Triglyceride >400. Suggest ordering Direct LDL (Unit Code: 45409).   Total Cholesterol/HDL Ratio:CHD Risk                        Coronary Heart Disease Risk Table                                        Men       Women          1/2 Average Risk              3.4        3.3              Average Risk              5.0        4.4           2X Average Risk              9.6        7.1           3X Average Risk             23.4       11.0 Use the calculated Patient Ratio above and the CHD Risk table  to determine the patient's CHD Risk. ATP III Classification (LDL):       < 100        mg/dL         Optimal       811 - 129     mg/dL  Near or Above Optimal      130 - 159     mg/dL         Borderline High      160 - 189     mg/dL         High       > 960        mg/dL         Very High   4/54/0981   Lab Results  Component Value Date   TRIG 131 11/01/2011   TRIG 348* 10/21/2011   TRIG 405* 03/06/2011   Lab Results  Component Value Date   CHOLHDL 3.4 11/01/2011   CHOLHDL 4.9 10/21/2011   CHOLHDL 5.9 03/06/2011   No results found for this basename: LDLDIRECT      Radiology:  *RADIOLOGY REPORT*  Clinical Data: Chest burning status post fall. History of stents.  CHEST - 2 VIEW  Comparison: 02/28/2012 and 01/23/2012 radiographs.  Findings: There are slightly lower lung volumes. The heart size  and mediastinal contours are stable status post CABG. There is  central airway thickening with mildly increased basilar  atelectasis. No confluent airspace opacity, edema or significant  pleural effusion is present.  IMPRESSION:  Mild atelectasis superimposed on chronic central airway thickening.  No acute cardiopulmonary process.  Original Report Authenticated By: Gerrianne Scale, M.D.    EKG:  NSR with inferolateral ST changes c/w ischemia - new compared to EKG done 03/03/2012  ASSESSMENT:  1.  NSTEMI now pain free 2.  CAD with history of remote CABG and PCI to SVG to OM1 patent at last cath 04/2011 with occluded distal limb to OM2 and filling via collaterals from OM1 3.  DM 4.  HTN 5.  PUD 6.  Dyslipidemia 7.  GERD 8.  Recent fall with no significant trauma 9.  H/O CVA  PLAN:   1.  Admit to ICU 2.  IV Heparin gtt 3.  IV NTG gtt 4.  Continue to cycle cardiac enzymes until they peak 5.  Continue ASA/coreg/Plavix/ACE i/statin 6.  NPO after midnight 7.  Hold Metformin for possible cath 8.  Further w/u with probable cath in am to be determined by Bea Laura, MD  03/09/2012  9:37 PM

## 2012-03-09 NOTE — ED Provider Notes (Cosign Needed)
History  This chart was scribed for [name], MD by Ladona Ridgel Day. This patient was seen in room APA14/APA14 and the patient's care was started at 1111.   CSN: [id number]  Arrival date & time 03/09/12  1111   First MD Initiated Contact with Patient 03/09/12 1145      Chief Complaint  Patient presents with  . Chest Pain  . Fall   The history is provided by the patient. No language interpreter was used.   Jeffrey [name] is a 78 y.o. male who presents to the Emergency Department complaining of a fall this AM on his porch immediately followed by burning chest pain which lasted about 30 minutes. He states that he fell down one concrete step off his porch onto the right side of his body and denies any pain or injuries from his fall, no LOC or striking his head. He ambulated on his own after the fall.  He states just a small abrasion to his right elbow and right shin from his fall. In concerns with his chest pain, which started shortly after he fell and it lasted 30 minutes and then only began intermittently giving him pain about 20 seconds a time. He last felt CP since 5 minutes ago. He states that movements make his CP worse. He states history of 5 stents and 2 MIs last year. He states has had these symptoms before and feels different than  when he had heart trouble which he states caused pain he describes as indigestion symptoms. He denies any nausea, diaphoresis or SOB.   PCP Dr. Sherwood Gambler Cardiologist Corinda Gubler  Past Medical History  Diagnosis Date  . Diabetes mellitus   . Hypercholesterolemia   . Coronary artery disease     a.  s/p CABG x 3 1989, VG->LCX, VG->RCA, LIMA->LAD;  b. 05/24/10 Cath/PCI: 3vd, LIMA patent, VG->RCA occluded, VG->LCX 90 in graft (3.0x15 promus) & 90 in distal LCX (2.75x15 Promus);  c. NSTEMI in 04/2011 - Cath occlusion of distal LCX stent.   . Coronary artery disease     a.  s/p CABG 1999;  b. s/p stenting to distal LCx, s/p stent to proximal SVG-LCx in 05/2010;  c.  NSTEMI in 04/2011 with cath showing occluded OM2 graft   . Diabetes mellitus   . Hypertension   . PUD (peptic ulcer disease)   . Hypercholesteremia   . Myocardial infarct, old   . GERD (gastroesophageal reflux disease)   . Arthritis   . Osteoarthritis   . Stroke   . Pneumonia     Past Surgical History  Procedure Date  . Coronary angioplasty with stent placement   . Tonsillectomy   . Post aortocoronary bypass surgery   . Coronary artery bypass graft   . Coronary stent placement   . Coronary angioplasty     History reviewed. No pertinent family history.  History  Substance Use Topics  . Smoking status: Not a smoker Former Smoker    Types: Cigarettes  . Smokeless tobacco: Current User    Types: Chewing Chew  . Alcohol Use: Does not drink No  Lives at home Lives alone   Patient's Medications  Previous Medications   ALBUTEROL (PROAIR HFA) 108 (90 BASE) MCG/ACT INHALER    Inhale 1-2 puffs into the lungs every 6 (six) hours as needed.   ASPIRIN 325 MG TABLET    Take 325 mg by mouth 2 (two) times daily as needed. For HEART/BLOOD THINNER/PAIN   B COMPLEX-BIOTIN-FA (B-COMPLEX PO)  Take 1 tablet by mouth every morning.    BISMUTH SUBSALICYLATE (PEPTO-BISMOL PO)    Take by mouth daily as needed. Patient states that he just takes a couple of swallows when needed to control bowels.    CALCIUM CARBONATE (TUMS - DOSED IN MG ELEMENTAL CALCIUM) 500 MG CHEWABLE TABLET    Chew 1 tablet by mouth 4 (four) times daily - after meals and at bedtime.   CARVEDILOL (COREG) 6.25 MG TABLET    Take 6.25 mg by mouth 2 (two) times daily with a meal. For blood pressure/heart   CLOPIDOGREL (PLAVIX) 75 MG TABLET    Take 75 mg by mouth every morning. Anti-Stroke   DOCUSATE SODIUM (STOOL SOFTENER) 100 MG CAPSULE    Take 100 mg by mouth at bedtime. Constipation   ESCITALOPRAM (LEXAPRO) 5 MG TABLET    Take 5 mg by mouth daily. For NERVES/Depression   ISOSORBIDE MONONITRATE (IMDUR) 30 MG 24 HR TABLET    TAKE  1 TABLET BY MOUTH DAILY FOR HEART.   MECLIZINE (ANTIVERT) 25 MG TABLET    Take 25 mg by mouth daily as needed. For dizziness   METFORMIN (GLUCOPHAGE) 500 MG TABLET    Take 500 mg by mouth 2 (two) times daily.   MULTIPLE VITAMIN (MULTIVITAMIN WITH MINERALS) TABS    Take 1 tablet by mouth daily.   NITROGLYCERIN (NITROSTAT) 0.4 MG SL TABLET    Place 1 tablet (0.4 mg total) under the tongue every 5 (five) minutes x 3 doses as needed for chest pain.   PANTOPRAZOLE (PROTONIX) 40 MG TABLET    Take 40 mg by mouth every morning.   RAMIPRIL (ALTACE) 5 MG CAPSULE    Take 5 mg by mouth daily. For blood pressure   ROSUVASTATIN (CRESTOR) 10 MG TABLET    Take 10 mg by mouth daily.   SAXAGLIPTIN HCL (ONGLYZA) 5 MG TABS TABLET    Take 5 mg by mouth at bedtime.    TAMSULOSIN HCL (FLOMAX) 0.4 MG CAPS    Take 0.4 mg by mouth at bedtime. For urinary health   TRAMADOL (ULTRAM) 50 MG TABLET    Take 50 mg by mouth every 8 (eight) hours as needed.     Review of Systems  Constitutional: Negative for chills and diaphoresis.  HENT: Negative for neck pain.   Respiratory: Negative for chest tightness and shortness of breath.   Cardiovascular: Positive for chest pain.  Gastrointestinal: Negative for diarrhea.  Musculoskeletal: Negative for back pain.  Neurological: Negative for weakness.  All other systems reviewed and are negative.    Allergies  Review of patient's allergies indicates no known allergies.  Home Medications     Triage Vitals: BP 136/68  Pulse 75  Temp 97.7 F (36.5 C) (Oral)  Resp 20  Ht 5\' 10"  (1.778 m)  Wt 180 lb (81.647 kg)  BMI 25.83 kg/m2  SpO2 98%  Vital signs normal    Physical Exam  Nursing note and vitals reviewed. Constitutional: He is oriented to person, place, and time. He appears well-developed and well-nourished.  Non-toxic appearance. He does not appear ill. No distress.  HENT:  Head: Normocephalic and atraumatic.  Right Ear: External ear normal.  Left Ear:  External ear normal.  Nose: Nose normal. No mucosal edema or rhinorrhea.  Mouth/Throat: Oropharynx is clear and moist and mucous membranes are normal. No dental abscesses or uvula swelling.  Eyes: Conjunctivae and EOM are normal. Pupils are equal, round, and reactive to light.  Neck: Normal range  of motion and full passive range of motion without pain. Neck supple.  Cardiovascular: Normal rate, regular rhythm and normal heart sounds.  Exam reveals no gallop and no friction rub.   No murmur heard. Pulmonary/Chest: Effort normal and breath sounds normal. No respiratory distress. He has no wheezes. He has no rhonchi. He has no rales. He exhibits no tenderness and no crepitus.    Abdominal: Soft. Normal appearance and bowel sounds are normal. He exhibits no distension. There is no tenderness. There is no rebound and no guarding.         Tender between RUQ and epigastric area.  Musculoskeletal: Normal range of motion. He exhibits no edema and no tenderness.       Moves all extremities well.   Neurological: He is alert and oriented to person, place, and time. He has normal strength. No cranial nerve deficit.  Skin: Skin is warm, dry and intact. No rash noted. No erythema. No pallor.       Nickle sized abrasion to right knee over tibial tuberosity, no pain with ROM to his right knee. Very superficial linear epidermal abrasion to proximal right forearm with no pain in ROM.   Psychiatric: He has a normal mood and affect. His speech is normal and behavior is normal. His mood appears not anxious.    ED Course  Procedures (including critical care time)   Medications  heparin ADULT infusion 100 units/mL (25000 units/250 mL) (1000 Units/hr Intravenous New Bag/Given 03/09/12 1340)  nitroGLYCERIN (NITROGLYN) 2 % ointment 1 inch (1 inch Topical Given 03/09/12 1238)  aspirin chewable tablet 324 mg (324 mg Oral Given 03/09/12 1349)  heparin bolus via infusion 4,000 Units (4000 Units Intravenous Given  03/09/12 1346)  0.9 %  sodium chloride infusion (20 mL/hr Intravenous New Bag/Given 03/09/12 1347)    DIAGNOSTIC STUDIES: Oxygen Saturation is 99% on room air, normal by my interpretation.    COORDINATION OF CARE: At 1245 PM Discussed treatment plan with patient which includes ASA, NTG paste, CXR, heart markers, blood work, and EKG. Patient agrees.   13:40 Joni Reining will talk to Dr Dietrich Pates and call me back.   13:49 Joni Reining has talked to Dr Dietrich Pates, feels he needs to go to [facility]  14:12 Dr Graciela Husbands accepts in transfer to [facility] stepdown  Results for orders placed during the hospital encounter of 03/09/12  CBC      Component Value Range   WBC 7.5  4.0 - 10.5 K/uL   RBC 4.22  4.22 - 5.81 MIL/uL   Hemoglobin 12.5 (*) 13.0 - 17.0 g/dL   HCT 60.4 (*) 54.0 - 98.1 %   MCV 88.4  78.0 - 100.0 fL   MCH 29.6  26.0 - 34.0 pg   MCHC 33.5  30.0 - 36.0 g/dL   RDW 19.1  47.8 - 29.5 %   Platelets 177  150 - 400 K/uL  BASIC METABOLIC PANEL      Component Value Range   Sodium 134 (*) 135 - 145 mEq/L   Potassium 4.6  3.5 - 5.1 mEq/L   Chloride 98  96 - 112 mEq/L   CO2 24  19 - 32 mEq/L   Glucose, Bld 195 (*) 70 - 99 mg/dL   BUN 12  6 - 23 mg/dL   Creatinine, Ser 6.21  0.50 - 1.35 mg/dL   Calcium 9.6  8.4 - 30.8 mg/dL   GFR calc non Af Amer 76 (*) >90 mL/min   GFR calc Af Amer 88 (*) >  90 mL/min  TROPONIN I      Component Value Range   Troponin I 0.59 (*) <0.30 ng/mL  HEPATIC FUNCTION PANEL      Component Value Range   Total Protein 6.7  6.0 - 8.3 g/dL   Albumin 3.8  3.5 - 5.2 g/dL   AST 25  0 - 37 U/L   ALT 23  0 - 53 U/L   Alkaline Phosphatase 68  39 - 117 U/L   Total Bilirubin 0.3  0.3 - 1.2 mg/dL   Bilirubin, Direct <1.6  0.0 - 0.3 mg/dL   Indirect Bilirubin NOT CALCULATED  0.3 - 0.9 mg/dL  LIPASE, BLOOD      Component Value Range   Lipase 48  11 - 59 U/L  PROTIME-INR      Component Value Range   Prothrombin Time 13.1  11.6 - 15.2 seconds   INR 0.97  0.00 - 1.49  APTT       Component Value Range   aPTT 28  24 - 37 seconds   Laboratory interpretation all normal except mild anemia, hyperglycemia, positive troponin   Dg Chest 2 View  02/28/2012  *RADIOLOGY REPORT*  Clinical Data: Chest pain.  CHEST - 2 VIEW  Comparison: Plain films of the chest 01/23/2012.  Findings: Heart size is normal.  The patient is status post CABG. Lungs are clear.  No pneumothorax or pleural fluid.  IMPRESSION: No acute disease.  Original Report Authenticated By: Bernadene Bell. D'ALESSIO, M.D.   #1  Date: 03/09/2012  Rate: 76  Rhythm: normal sinus rhythm  QRS Axis: normal  Intervals: normal  ST/T Wave abnormalities: TWI in lateral leads, mild ST depression lateral leads  Conduction Disutrbances:none  Narrative Interpretation:   Old EKG Reviewed: changes noted from 02/28/2012 STTWC are new  #2  Date: 03/09/2012  Rate: 77  Rhythm: normal sinus rhythm  QRS Axis: normal  Intervals: normal  ST/T Wave abnormalities: TWI in lateral leads with ST dep in lateral leads  Conduction Disutrbances:none  Narrative Interpretation:   Old EKG Reviewed: unchanged from earlier today    Diagnoses that have been ruled out:  None  Diagnoses that are still under consideration:  None  Final diagnoses:  NSTEMI (non-ST elevated myocardial infarction)   Plan transfer to [facility]   MDM   I personally performed the services described in this documentation, which was scribed in my presence. The recorded information has been reviewed and considered.  Devoria Albe, MD, Armando Gang         [name], MD 03/09/12 [phone]

## 2012-03-09 NOTE — ED Notes (Signed)
Lab called with abnormal troponin 0.72.  Notified edp

## 2012-03-09 NOTE — Progress Notes (Signed)
ANTICOAGULATION CONSULT NOTE  Pharmacy Consult for Heparin Indication: chest pain/ACS  No Known Allergies  Patient Measurements: Height: 5\' 10"  (177.8 cm) Weight: 180 lb (81.647 kg) IBW/kg (Calculated) : 73  Heparin Dosing Weight: 81 kg  Vital Signs: Temp: 98.1 F (36.7 C) (08/27 1932) Temp src: Oral (08/27 1118) BP: 132/68 mmHg (08/27 1932) Pulse Rate: 72  (08/27 1932)  Labs:  Basename 03/09/12 1816 03/09/12 1339 03/09/12 1140  HGB -- -- 12.5*  HCT -- -- 37.3*  PLT -- -- 177  APTT -- -- 28  LABPROT -- -- 13.1  INR -- -- 0.97  HEPARINUNFRC 0.22* -- --  CREATININE -- -- 0.94  CKTOTAL -- -- --  CKMB -- -- --  TROPONINI -- 0.72* 0.59*    Estimated Creatinine Clearance: 63.6 ml/min (by C-G formula based on Cr of 0.94).   Medical History: Past Medical History  Diagnosis Date  . Diabetes mellitus   . Hypercholesterolemia   . Coronary artery disease     a.  s/p CABG x 3 1989, VG->LCX, VG->RCA, LIMA->LAD;  b. 05/24/10 Cath/PCI: 3vd, LIMA patent, VG->RCA occluded, VG->LCX 90 in graft (3.0x15 promus) & 90 in distal LCX (2.75x15 Promus);  c. NSTEMI in 04/2011 - Cath occlusion of distal LCX stent.   . Coronary artery disease     a.  s/p CABG 1999;  b. s/p stenting to distal LCx, s/p stent to proximal SVG-LCx in 05/2010;  c. NSTEMI in 04/2011 with cath showing occluded OM2 graft   . Diabetes mellitus   . Hypertension   . PUD (peptic ulcer disease)   . Hypercholesteremia   . Myocardial infarct, old   . GERD (gastroesophageal reflux disease)   . Arthritis   . Osteoarthritis   . Stroke   . Pneumonia     Medications:  Scheduled:     . sodium chloride   Intravenous Once  . aspirin  324 mg Oral Once  . heparin  4,000 Units Intravenous Once  . nitroGLYCERIN  1 inch Topical Once    Assessment: Ok for protocol Heparin Level below goal (drawn approx. 5 hours after initiation of therapy).   Goal of Therapy:  Heparin level 0.3-0.7 units/ml Monitor platelets by  anticoagulation protocol: Yes   Plan:  Increase Heparin to 1150 units/hr. F/U AM labs in 8 hours.  Mady Gemma 03/09/2012,7:36 PM

## 2012-03-09 NOTE — ED Notes (Signed)
CRITICAL VALUE ALERT  Critical value received Troponin  Date of notification 03/09/2012  Time of notification 1241    Dr Lars Mage notified  Critical value read back: 0.594  Nurse who received alert Dionisio Paschal  MD notified Dr Lars Mage.    MD notified (2nd page):  Time of second page:  Responding MD:  Dr Lynelle Doctor  Time MD responded:  [phone]

## 2012-03-09 NOTE — Progress Notes (Signed)
ANTICOAGULATION CONSULT NOTE - Initial Consult  Pharmacy Consult for Heparin Indication: chest pain/ACS  No Known Allergies  Patient Measurements: Height: 5\' 10"  (177.8 cm) Weight: 180 lb (81.647 kg) IBW/kg (Calculated) : 73  Heparin Dosing Weight: 81 kg  Vital Signs: Temp: 97.7 F (36.5 C) (08/27 1118) Temp src: Oral (08/27 1118) BP: 136/68 mmHg (08/27 1118) Pulse Rate: 75  (08/27 1118)  Labs:  Basename 03/09/12 1140  HGB 12.5*  HCT 37.3*  PLT 177  APTT 28  LABPROT 13.1  INR 0.97  HEPARINUNFRC --  CREATININE 0.94  CKTOTAL --  CKMB --  TROPONINI 0.59*    Estimated Creatinine Clearance: 63.6 ml/min (by C-G formula based on Cr of 0.94).   Medical History: Past Medical History  Diagnosis Date  . Diabetes mellitus   . Hypercholesterolemia   . Coronary artery disease     a.  s/p CABG x 3 1989, VG->LCX, VG->RCA, LIMA->LAD;  b. 05/24/10 Cath/PCI: 3vd, LIMA patent, VG->RCA occluded, VG->LCX 90 in graft (3.0x15 promus) & 90 in distal LCX (2.75x15 Promus);  c. NSTEMI in 04/2011 - Cath occlusion of distal LCX stent.   . Coronary artery disease     a.  s/p CABG 1999;  b. s/p stenting to distal LCx, s/p stent to proximal SVG-LCx in 05/2010;  c. NSTEMI in 04/2011 with cath showing occluded OM2 graft   . Diabetes mellitus   . Hypertension   . PUD (peptic ulcer disease)   . Hypercholesteremia   . Myocardial infarct, old   . GERD (gastroesophageal reflux disease)   . Arthritis   . Osteoarthritis   . Stroke   . Pneumonia     Medications:  Scheduled:    . aspirin  324 mg Oral Once  . heparin  4,000 Units Intravenous Once  . nitroGLYCERIN  1 inch Topical Once    Assessment: Ok for protocol  Goal of Therapy:  Heparin level 0.3-0.7 units/ml Monitor platelets by anticoagulation protocol: Yes   Plan:  Give 4000 units bolus x 1 Start heparin infusion at 1000 units/hr Check anti-Xa level in 8 hours and daily while on heparin Continue to monitor H&H and  platelets  Raquel Kolson, Brendaly Townsel Bennett 03/09/2012,1:27 PM

## 2012-03-09 NOTE — ED Notes (Signed)
No new orders received of critical Lab value. Pt remains stable, VS WNL. Denies pain at this time  NAD at this time. Pt also notified of phone call from granddaughter.

## 2012-03-09 NOTE — ED Notes (Signed)
Pt reports falling of his porch this morning. Pt reports that he fell on his right side and has chest pain ever since. Pt denies SOB. Pt reports that he has had a stroke and five stents placed in the last year.

## 2012-03-10 ENCOUNTER — Encounter (HOSPITAL_COMMUNITY)
Admission: EM | Disposition: A | Discharge status: Home / Self Care | Payer: Self-pay | Source: Home / Self Care | Attending: Internal Medicine

## 2012-03-10 DIAGNOSIS — I214 Non-ST elevation (NSTEMI) myocardial infarction: Secondary | ICD-10-CM | POA: Diagnosis present

## 2012-03-10 DIAGNOSIS — I25810 Atherosclerosis of coronary artery bypass graft(s) without angina pectoris: Secondary | ICD-10-CM

## 2012-03-10 HISTORY — PX: LEFT HEART CATHETERIZATION WITH CORONARY/GRAFT ANGIOGRAM: SHX5450

## 2012-03-10 LAB — GLUCOSE, CAPILLARY
Glucose-Capillary: 144 mg/dL — ABNORMAL HIGH (ref 70–99)
Glucose-Capillary: 180 mg/dL — ABNORMAL HIGH (ref 70–99)
Glucose-Capillary: 115 mg/dL — ABNORMAL HIGH (ref 70–99)
Glucose-Capillary: 170 mg/dL — ABNORMAL HIGH (ref 70–99)

## 2012-03-10 LAB — CARDIAC PANEL(CRET KIN+CKTOT+MB+TROPI)
CK, MB: 10.3 ng/mL (ref 0.3–4.0)
CK, MB: 9.3 ng/mL (ref 0.3–4.0)
Relative Index: 3.6 — ABNORMAL HIGH (ref 0.0–2.5)
Total CK: 346 U/L — ABNORMAL HIGH (ref 7–232)
Total CK: 283 U/L — ABNORMAL HIGH (ref 7–232)
Total CK: 262 U/L — ABNORMAL HIGH (ref 7–232)
Troponin I: 0.49 ng/mL (ref <0.30)

## 2012-03-10 LAB — COMPREHENSIVE METABOLIC PANEL
Albumin: 3.5 g/dL (ref 3.5–5.2)
Alkaline Phosphatase: 63 U/L (ref 39–117)
BUN: 10 mg/dL (ref 6–23)
Calcium: 9.4 mg/dL (ref 8.4–10.5)
Creatinine, Ser: 0.88 mg/dL (ref 0.50–1.35)
GFR calc Af Amer: >90 mL/min (ref >90)
Glucose, Bld: 157 mg/dL — ABNORMAL HIGH (ref 70–99)
Potassium: 3.5 mEq/L (ref 3.5–5.1)
Total Protein: 6.4 g/dL (ref 6.0–8.3)

## 2012-03-10 LAB — HEPARIN LEVEL (UNFRACTIONATED): Heparin Unfractionated: 0.26 IU/mL — ABNORMAL LOW (ref 0.30–0.70)

## 2012-03-10 LAB — BASIC METABOLIC PANEL
BUN: 9 mg/dL (ref 6–23)
Chloride: 100 mEq/L (ref 96–112)
GFR calc Af Amer: >90 mL/min (ref >90)
GFR calc non Af Amer: 80 mL/min — ABNORMAL LOW (ref >90)
Potassium: 4.1 mEq/L (ref 3.5–5.1)
Sodium: 134 mEq/L — ABNORMAL LOW (ref 135–145)

## 2012-03-10 LAB — POCT ACTIVATED CLOTTING TIME: Activated Clotting Time: 329 seconds

## 2012-03-10 LAB — HEMOGLOBIN A1C
Hgb A1c MFr Bld: 7.4 % — ABNORMAL HIGH (ref <5.7)
Mean Plasma Glucose: 166 mg/dL — ABNORMAL HIGH (ref <117)

## 2012-03-10 LAB — CBC
MCH: 29.2 pg (ref 26.0–34.0)
MCHC: 33.1 g/dL (ref 30.0–36.0)
MCV: 88.3 fL (ref 78.0–100.0)
Platelets: 138 10*3/uL — ABNORMAL LOW (ref 150–400)
RBC: 4.28 MIL/uL (ref 4.22–5.81)
RDW: 13.5 % (ref 11.5–15.5)

## 2012-03-10 LAB — PROTIME-INR
INR: 1.06 (ref 0.00–1.49)
Prothrombin Time: 14 seconds (ref 11.6–15.2)

## 2012-03-10 LAB — LIPID PANEL: LDL Cholesterol: 49 mg/dL (ref 0–99)

## 2012-03-10 SURGERY — LEFT HEART CATHETERIZATION WITH CORONARY/GRAFT ANGIOGRAM
Anesthesia: LOCAL

## 2012-03-10 MED ORDER — SODIUM CHLORIDE 0.9 % IJ SOLN
3.0000 mL | Freq: Two times a day (BID) | INTRAMUSCULAR | Status: DC
  Administered 2012-03-10: 3 mL via INTRAVENOUS

## 2012-03-10 MED ORDER — FENTANYL CITRATE 0.05 MG/ML IJ SOLN
INTRAMUSCULAR | Status: AC
  Filled 2012-03-10: qty 2

## 2012-03-10 MED ORDER — SODIUM CHLORIDE 0.9 % IV SOLN: 250.0000 mL | INTRAVENOUS | Status: DC | PRN

## 2012-03-10 MED ORDER — HEPARIN (PORCINE) IN NACL 2-0.9 UNIT/ML-% IJ SOLN
INTRAMUSCULAR | Status: AC
  Filled 2012-03-10: qty 2000

## 2012-03-10 MED ORDER — ASPIRIN 81 MG PO CHEW: 324.0000 mg | CHEWABLE_TABLET | ORAL | Status: DC

## 2012-03-10 MED ORDER — BIVALIRUDIN 250 MG IV SOLR
INTRAVENOUS | Status: AC
  Filled 2012-03-10: qty 250

## 2012-03-10 MED ORDER — LIDOCAINE HCL (PF) 1 % IJ SOLN
INTRAMUSCULAR | Status: AC
  Filled 2012-03-10: qty 30

## 2012-03-10 MED ORDER — SODIUM CHLORIDE 0.9 % IJ SOLN: 3.0000 mL | INTRAMUSCULAR | Status: DC | PRN

## 2012-03-10 MED ORDER — SODIUM CHLORIDE 0.9 % IV SOLN: 1.0000 mL/kg/h | INTRAVENOUS | Status: DC

## 2012-03-10 MED ORDER — TICAGRELOR 90 MG PO TABS
ORAL_TABLET | ORAL | Status: AC
  Filled 2012-03-10: qty 2

## 2012-03-10 MED ORDER — DIAZEPAM 5 MG PO TABS
5.0000 mg | ORAL_TABLET | ORAL | Status: AC
  Administered 2012-03-10: 5 mg via ORAL
  Filled 2012-03-10: qty 1

## 2012-03-10 MED ORDER — INSULIN ASPART 100 UNIT/ML ~~LOC~~ SOLN
0.0000 [IU] | Freq: Three times a day (TID) | SUBCUTANEOUS | Status: DC
  Administered 2012-03-10: 3 [IU] via SUBCUTANEOUS
  Administered 2012-03-11: 13:00:00 5 [IU] via SUBCUTANEOUS
  Administered 2012-03-11: 09:00:00 3 [IU] via SUBCUTANEOUS
  Administered 2012-03-11: 18:00:00 5 [IU] via SUBCUTANEOUS
  Administered 2012-03-12: 09:00:00 3 [IU] via SUBCUTANEOUS
  Administered 2012-03-12: 14:00:00 5 [IU] via SUBCUTANEOUS

## 2012-03-10 MED ORDER — MIDAZOLAM HCL 2 MG/2ML IJ SOLN
INTRAMUSCULAR | Status: AC
  Filled 2012-03-10: qty 2

## 2012-03-10 MED ORDER — NITROGLYCERIN 0.2 MG/ML ON CALL CATH LAB
INTRAVENOUS | Status: AC
  Filled 2012-03-10: qty 1

## 2012-03-10 NOTE — Progress Notes (Signed)
Inpatient Diabetes Program Recommendations  AACE/ADA: New Consensus Statement on Inpatient Glycemic Control (2013)  Target Ranges:  Prepandial:   less than 140 mg/dL      Peak postprandial:   less than 180 mg/dL (1-2 hours)      Critically ill patients:  140 - 180 mg/dL   Reason for Visit: Note history of diabetes.  Please check CBG's tid and HS and add Novolog moderate correction.  Note A1C=7.4%.  Lab glucose this AM=173 mg/dL.

## 2012-03-10 NOTE — Care Management Note (Addendum)
    Page 1 of 2   03/12/2012     12:23:34 PM   CARE MANAGEMENT NOTE 03/12/2012  Patient:  Jeffrey [name], Jeffrey [name]   Account Number:  [id number]  Date Initiated:  03/10/2012  Documentation initiated by:  Junius Creamer  Subjective/Objective Assessment:   adm w mi     Action/Plan:   lives alone in apt in [place]. he has cign[employer] ins to help w med costs. he goes to church for lunch daily. he still drives and does his own shopping.   Anticipated DC Date:     Anticipated DC Plan:        DC Planning Services  CM consult      [facility] Choice  HOME HEALTH   Choice offered to / List presented to:  C-1 Patient        HH arranged  HH-1 RN      [facility] agency  [facility]   Status of service:  Completed, signed off Medicare Important Message given?   (If response is "NO", the following Medicare IM given date fields will be blank) Date Medicare IM given:   Date Additional Medicare IM given:    Discharge Disposition:  HOME W HOME HEALTH SERVICES  Per UR Regulation:  Reviewed for med. necessity/level of care/duration of stay  If discussed at Long Length of Stay Meetings, dates discussed:    Comments:  03-12-12 1218 Tomi [place]amber[place], [place] [phone] CM did make referral for RN for disease management/ new medication management.Soc to begin within 24-48 hours post d/c.  CM gave pt a 30 day free brilinta card and no copay for medicaiton. [facility] has medication available.   8/28 9:51a debbie dowell rn,bsn [phone] pt given [facility] list in case needed. states has had [facility] in past and would like nse to ck on him after disch. will alert [facility] but will need md order and face to face. he does have daughter.

## 2012-03-10 NOTE — Plan of Care (Signed)
Problem: Limited Adherence to Nutrition-Related Recommendations (NB-1.6) Goal: Nutrition education Formal process to instruct or train a patient/client in a skill or to impart knowledge to help patients/clients voluntarily manage or modify food choices and eating behavior to maintain or improve health.  Outcome: Completed/Met Date Met:  03/10/12  RD consulted for nutrition education regarding diabetes.    Lab Results  Component Value Date    HGBA1C 7.4* 03/09/2012    RD provided "Carbohydrate Counting for People with Diabetes" handout from the Academy of Nutrition and Dietetics. Per recall, patient eats out at [facility] most days of the week. Patient states he cannot consumes some healthier options available (ie salads) due to edentulous state; doesn't have any dentures at home or in hospital. RD discussed healthy meal items he could have at home within diet guidelines. He does report to have some financial constraints. Encouraged continuing diet/sugar-free beverages.  Expect fair compliance.  Body mass index is 24.83 kg/(m^2). Pt meets criteria for Normal based on current BMI.  Current diet order is Carbohydrate Modified Medium Calorie, patient is consuming approximately 100% of meals at this time. Labs and medications reviewed. No further nutrition interventions warranted at this time. If additional nutrition issues arise, please re-consult RD.  Kirkland Hun, RD, LDN Pager #: [phone] After-Hours Pager #: [phone]

## 2012-03-10 NOTE — CV Procedure (Signed)
Cardiac Catheterization Procedure Note  Name: Jeffrey [name] MRN: [id number] DOB: 1929-02-20  Procedure: Left Heart Cath, Selective Coronary Angiography, LV angiography,  saphenous vein graft angiography, LIMA angiography, PTCA/Stent of the saphenous vein graft to obtuse marginal, Perclose of the right femoral artery.  Indication: Non-ST elevation MI. This 78 year old gentleman with prior coronary bypass surgery and multiple PCI procedures presented with non-STEMI. He was referred for cardiac catheterization.   Diagnostic Procedure Details: The right groin was prepped, draped, and anesthetized with 1% lidocaine. Using the modified Seldinger technique, a 5 French sheath was introduced into the right femoral artery. Standard Judkins catheters were used for selective coronary angiography and left ventriculography. LIMA angiography and saphenous vein graft angiography was performed with the JR 4 catheter. Catheter exchanges were performed over a wire.  The diagnostic procedure was well-tolerated without immediate complications.  PROCEDURAL FINDINGS Hemodynamics: AO 150/68 LV 150/13  Coronary angiography: Coronary dominance: right  Left mainstem: Heavily calcified, 30-50% distal left main stenosis.  Left anterior descending (LAD): The LAD is severely diseased. There is very tight ostial stenosis. The proximal vessel is totally occluded and there is a very large septal cascade that arises before the area of occlusion. These vessels supply collaterals to the distal right coronary artery circulation appear  Left circumflex (LCx): The left circumflex is severely diseased in the proximal aspect. The mid vessel is totally occluded. The obtuse marginals are totally occluded.  Right coronary artery (RCA): The right coronary artery is totally occluded.  LIMA to LAD: The vessel is widely patent. The distal anastomotic site is patent. There is no significant stenosis.  Saphenous vein graft to  diagonal: Known chronic occlusion  Saphenous vein graft sequence to OM1 and OM 2: there is moderately tight 50-60% stenosis in the proximal graft just beyond the ostium. There is 95-99% stenosis at the distal edge of the stent in the proximal body of the graft. The remaining portions of graft are patent.  Left ventriculography: Diffuse left ventricular hypokinesis is present. There is akinesis of the basal and midinferior wall. The estimated left ventricular ejection fraction is 35-40%. There is mild mitral regurgitation.  PCI Procedure Note:  Following the diagnostic procedure, the decision was made to proceed with PCI. The sheath was upsized to a 6 [place]. Weight-based bivalirudin was given for anticoagulation. The patient was treated with brilinta 180 mg since he has had multiple recurrent non-ST elevation infarction events on Plavix. Once a therapeutic ACT was achieved, a 6 French LCB guide catheter was inserted.  A cougar coronary guidewire was used to cross the lesion.  The lesion was predilated with a 3.0 x 15 mm noncompliant balloon. There was a suboptimal result after high-pressure balloon angioplasty even with a prolonged inflation. There was 50% residual stenosis. I felt like the only option was to restent this area. Since the stent would extend beyond the previously placed stent, I elected to use distal embolic protection. A 4 mm spider device was used. A 3.5 x 16 mm Promus drug-eluting stent platform was chosen. The stent was deployed at 16 atmospheres. The stent was then postdilated with a 3.75 mm noncompliant balloon taken to a maximum pressure of 18 atmospheres. The ostial region was predilated because it had moderate stenosis as well. There was an excellent angiographic result with 0% residual stenosis. The distal embolic protection device was captured and retrieved. Following PCI, there was 0% residual stenosis and TIMI-3 flow. Final angiography confirmed an excellent result. Femoral  hemostasis was achieved with a  Perclose device.  The patient tolerated the PCI procedure well. There were no immediate procedural complications.  The patient was transferred to the post catheterization recovery area for further monitoring.  PCI Data: Vessel - OM 1/Segment - proximal body of saphenous vein graft Percent Stenosis (pre)  99 TIMI-flow 3 Stent 3.5 x 16 mm drug-eluting Percent Stenosis (post) 0 TIMI-flow (post) 3  Final Conclusions:   1. Severe native three-vessel coronary artery disease 2. Status post coronary bypass surgery with continued patency of the LIMA to LAD, severe in-stent restenosis of the saphenous vein graft sequence to obtuse marginals, and known total occlusion of the saphenous vein graft to diagonal 3. Moderately severe segmental left ventricular systolic dysfunction 4. Successful PCI of the saphenous vein graft to obtuse marginal branch using distal embolic protection  Recommendations: Recommend continue dual antiplatelet therapy with aspirin and brilinta indefinitely as tolerated.  Jeffrey [name] 03/10/2012, 7:17 PM

## 2012-03-10 NOTE — Interval H&P Note (Signed)
History and Physical Interval Note:  03/10/2012 6:10 PM  Jeffrey [name]  has presented today for surgery, with the diagnosis of cp  The various methods of treatment have been discussed with the patient and family. After consideration of risks, benefits and other options for treatment, the patient has consented to  Procedure(s) (LRB): LEFT HEART CATHETERIZATION WITH CORONARY/GRAFT ANGIOGRAM (N/A) as a surgical intervention .  The patient's history has been reviewed, patient examined, no change in status, stable for surgery.  I have reviewed the patient's chart and labs.  Questions were answered to the patient's satisfaction.     [name]

## 2012-03-10 NOTE — Progress Notes (Signed)
 Patient Name: Jeffrey [name] Date of Encounter: 03/10/2012     SUBJECTIVE: Currently without chest pain or SOB. Pain this time was a burning in the epigastrium, happened after a fall, no syncope or LOC.  OBJECTIVE Filed Vitals:   03/10/12 0100 03/10/12 0300 03/10/12 0356 03/10/12 0500  BP: 110/47 108/51  132/64  Pulse: 72 67  71  Temp:  98.7 F (37.1 C) 98.7 F (37.1 C)   TempSrc:  Oral Oral   Resp: 31 15  16  Height:      Weight:      SpO2: 94% 95%  95%    Intake/Output Summary (Last 24 hours) at 03/10/12 0656 Last data filed at 03/10/12 0600  Gross per 24 hour  Intake 281.25 ml  Output   1450 ml  Net -1168.75 ml   Filed Weights   03/09/12 1118 03/09/12 2024  Weight: 180 lb (81.647 kg) 173 lb 1 oz (78.5 kg)     PHYSICAL EXAM General: Well developed, well nourished, male in no acute distress. Head: Normocephalic, atraumatic.  Neck: Supple without bruits, JVD not elevated. Lungs:  Resp regular and unlabored, rales bilaterally, right > left Heart: RRR, S1, S2, no S3, S4, or murmur. Abdomen: Soft, non-tender, non-distended, BS + x 4.  Extremities: No clubbing, cyanosis, no edema. Pulses intact all 4 extrem  Neuro: Alert and oriented X 3. Moves all extremities spontaneously. Psych: Normal affect.  LABS: CBC: Basename 03/10/12 0615 03/09/12 2300  WBC 5.9 6.8  NEUTROABS -- 4.1  HGB 12.5* 12.2*  HCT 37.8* 36.6*  MCV 88.3 87.8  PLT 138* 144*   INR: Basename 03/09/12 2300  INR 1.06   Basic Metabolic Panel: Basename 03/09/12 2300 03/09/12 1140  NA 136 134*  K 3.5 4.6  CL 101 98  CO2 26 24  GLUCOSE 157* 195*  BUN 10 12  CREATININE 0.88 0.94  CALCIUM 9.4 9.6  MG -- --  PHOS -- --   Liver Function Tests: Basename 03/09/12 2300 03/09/12 1140  AST 22 25  ALT 20 23  ALKPHOS 63 68  BILITOT 0.2* 0.3  PROT 6.4 6.7  ALBUMIN 3.5 3.8   Cardiac Enzymes: Basename 03/09/12 2300 03/09/12 1339 03/09/12 1140  CKTOTAL 346* -- --  CKMB 12.4* -- --    CKMBINDEX -- -- --  TROPONINI 0.52* 0.72* 0.59*   Hemoglobin A1C: Basename 03/09/12 2300  HGBA1C 7.4*   Fasting Lipid Panel: PENDING   TELE:  SR    Radiology/Studies: Dg Chest 2 View 03/09/2012  *RADIOLOGY REPORT*  Clinical Data: Chest burning status post fall.  History of stents.  CHEST - 2 VIEW  Comparison: 02/28/2012 and 01/23/2012 radiographs.  Findings: There are slightly lower lung volumes.  The heart size and mediastinal contours are stable status post CABG.  There is central airway thickening with mildly increased basilar atelectasis.  No confluent airspace opacity, edema or significant pleural effusion is present.  IMPRESSION: Mild atelectasis superimposed on chronic central airway thickening. No acute cardiopulmonary process.   Original Report Authenticated By: WILLIAM B. VEAZEY, M.D.     Current Medications:     . sodium chloride   Intravenous Once  . aspirin  324 mg Oral Once  . aspirin EC  81 mg Oral Daily  . atorvastatin  20 mg Oral q1800  . calcium carbonate  1 tablet Oral TID PC & HS  . carvedilol  6.25 mg Oral BID WC  . clopidogrel  75 mg Oral Q breakfast  .   docusate sodium  100 mg Oral QHS  . escitalopram  5 mg Oral Daily  . heparin  4,000 Units Intravenous Once  . linagliptin  5 mg Oral Daily  . meclizine  25 mg Oral Daily  . multivitamin with minerals  1 tablet Oral Daily  . nitroGLYCERIN  1 inch Topical Once  . pantoprazole  40 mg Oral Q1200  . ramipril  5 mg Oral Daily  . Tamsulosin HCl  0.4 mg Oral QHS  . DISCONTD: Docusate Sodium  100 mg Oral QHS      . heparin 1,150 Units/hr (03/09/12 2100)  . nitroGLYCERIN 5 mcg/min (03/09/12 2230)  . DISCONTD: sodium chloride 75 mL/hr at 03/09/12 2100    ASSESSMENT AND PLAN: Principal Problem:  *Non-ST elevation myocardial infarction (NSTEMI), initial care - cath today, MD discussed with patient, he is on the board. Continue current Rx.  Active Problems:  HYPERLIPIDEMIA, MIXED - profile pending, continue  statin   GERD - continue PPI   HTN (hypertension) - good control on current Rx  DM - glucophage held, on onglyza, add SSI, nutrition to see  Social issues - will ask care management to see, pt lives alone, dtr/son-in-law have health issues, pt fell and may need "Life alert" or similar plus safety assessment +/- HHRN.  Signed, Rhonda Barrett , PA-C 6:56 AM 03/10/2012  I have personally seen and examined the patient.  He presents with some chest discomfort, suggestive of cardiac pain,after a fall. His cardiac enzymes are up, and this could be from demand ischemia, but a new issue related to his grafts cannot be excluded.  He clearly had sources for demand ischemia, with an occluded RCA graft with collateralized vessels, as well as OM2.  However, a new issue cannot be excluded.  Thus cardiac catheterization would seem to be appropriate.  The patient is agreeable to proceed.     

## 2012-03-10 NOTE — Progress Notes (Signed)
ANTICOAGULATION CONSULT NOTE  Pharmacy Consult for Heparin Indication: chest pain/ACS  No Known Allergies  Patient Measurements: Height: 5\' 10"  (177.8 cm) Weight: 173 lb 1 oz (78.5 kg) IBW/kg (Calculated) : 73  Heparin Dosing Weight: 81 kg  Vital Signs: Temp: 98.7 F (37.1 C) (08/28 0356) Temp src: Oral (08/28 0356) BP: 132/64 mmHg (08/28 0500) Pulse Rate: 71  (08/28 0500)  Labs:  Basename 03/10/12 0615 03/10/12 0600 03/09/12 2300 03/09/12 1816 03/09/12 1339 03/09/12 1140  HGB 12.5* -- 12.2* -- -- --  HCT 37.8* -- 36.6* -- -- 37.3*  PLT 138* -- 144* -- -- 177  APTT -- -- 75* -- -- 28  LABPROT -- -- 14.0 -- -- 13.1  INR -- -- 1.06 -- -- 0.97  HEPARINUNFRC 0.26* -- -- 0.22* -- --  CREATININE -- 0.85 0.88 -- -- 0.94  CKTOTAL -- -- 346* -- -- --  CKMB -- -- 12.4* -- -- --  TROPONINI -- -- 0.52* -- 0.72* 0.59*    Estimated Creatinine Clearance: 70.4 ml/min (by C-G formula based on Cr of 0.85).   Medical History: Past Medical History  Diagnosis Date  . Diabetes mellitus   . Hypercholesterolemia   . Coronary artery disease     a.  s/p CABG x 3 1989, VG->LCX, VG->RCA, LIMA->LAD;  b. 05/24/10 Cath/PCI: 3vd, LIMA patent, VG->RCA occluded, VG->LCX 90 in graft (3.0x15 promus) & 90 in distal LCX (2.75x15 Promus);  c. NSTEMI in 04/2011 - Cath occlusion of distal LCX stent.   . Coronary artery disease     a.  s/p CABG 1999;  b. s/p stenting to distal LCx, s/p stent to proximal SVG-LCx in 05/2010;  c. NSTEMI in 04/2011 with cath showing occluded OM2 graft   . Diabetes mellitus   . Hypertension   . PUD (peptic ulcer disease)   . Hypercholesteremia   . Myocardial infarct, old   . GERD (gastroesophageal reflux disease)   . Arthritis   . Osteoarthritis   . Stroke   . Pneumonia   . Anginal pain     Medications:  Scheduled:     . sodium chloride   Intravenous Once  . aspirin  324 mg Oral Once  . aspirin EC  81 mg Oral Daily  . atorvastatin  20 mg Oral q1800  . calcium  carbonate  1 tablet Oral TID PC & HS  . carvedilol  6.25 mg Oral BID WC  . clopidogrel  75 mg Oral Q breakfast  . docusate sodium  100 mg Oral QHS  . escitalopram  5 mg Oral Daily  . heparin  4,000 Units Intravenous Once  . linagliptin  5 mg Oral Daily  . meclizine  25 mg Oral Daily  . multivitamin with minerals  1 tablet Oral Daily  . nitroGLYCERIN  1 inch Topical Once  . pantoprazole  40 mg Oral Q1200  . ramipril  5 mg Oral Daily  . Tamsulosin HCl  0.4 mg Oral QHS  . DISCONTD: Docusate Sodium  100 mg Oral QHS    Assessment: Heparin Level slightly subtherapeutic  Goal of Therapy:  Heparin level 0.3-0.7 units/ml Monitor platelets by anticoagulation protocol: Yes   Plan:  Increase Heparin to 1250 units/hr. F/U heparin level in 8 hours.  Talbert Cage Poteet 03/10/2012,7:20 AM

## 2012-03-10 NOTE — H&P (View-Only) (Signed)
Patient Name: Jeffrey [name] Date of Encounter: 03/10/2012     SUBJECTIVE: Currently without chest pain or SOB. Pain this time was a burning in the epigastrium, happened after a fall, no syncope or LOC.  OBJECTIVE Filed Vitals:   03/10/12 0100 03/10/12 0300 03/10/12 0356 03/10/12 0500  BP: 110/47 108/51  132/64  Pulse: 72 67  71  Temp:  98.7 F (37.1 C) 98.7 F (37.1 C)   TempSrc:  Oral Oral   Resp: 31 15  16   Height:      Weight:      SpO2: 94% 95%  95%    Intake/Output Summary (Last 24 hours) at 03/10/12 0656 Last data filed at 03/10/12 0600  Gross per 24 hour  Intake 281.25 ml  Output   1450 ml  Net -1168.75 ml   Filed Weights   03/09/12 1118 03/09/12 2024  Weight: 180 lb (81.647 kg) 173 lb 1 oz (78.5 kg)     PHYSICAL EXAM General: Well developed, well nourished, male in no acute distress. Head: Normocephalic, atraumatic.  Neck: Supple without bruits, JVD not elevated. Lungs:  Resp regular and unlabored, rales bilaterally, right > left Heart: RRR, S1, S2, no S3, S4, or murmur. Abdomen: Soft, non-tender, non-distended, BS + x 4.  Extremities: No clubbing, cyanosis, no edema. Pulses intact all 4 extrem  Neuro: Alert and oriented X 3. Moves all extremities spontaneously. Psych: Normal affect.  LABS: CBC: Basename 03/10/12 0615 03/09/12 2300  WBC 5.9 6.8  NEUTROABS -- 4.1  HGB 12.5* 12.2*  HCT 37.8* 36.6*  MCV 88.3 87.8  PLT 138* 144*   INR: Basename 03/09/12 2300  INR 1.06   Basic Metabolic Panel: Basename 03/09/12 2300 03/09/12 1140  NA 136 134*  K 3.5 4.6  CL 101 98  CO2 26 24  GLUCOSE 157* 195*  BUN 10 12  CREATININE 0.88 0.94  CALCIUM 9.4 9.6  MG -- --  PHOS -- --   Liver Function Tests: Basename 03/09/12 2300 03/09/12 1140  AST 22 25  ALT 20 23  ALKPHOS 63 68  BILITOT 0.2* 0.3  PROT 6.4 6.7  ALBUMIN 3.5 3.8   Cardiac Enzymes: Basename 03/09/12 2300 03/09/12 1339 03/09/12 1140  CKTOTAL 346* -- --  CKMB 12.4* -- --    CKMBINDEX -- -- --  TROPONINI 0.52* 0.72* 0.59*   Hemoglobin A1C: Basename 03/09/12 2300  HGBA1C 7.4*   Fasting Lipid Panel: PENDING   TELE:  SR    Radiology/Studies: Dg Chest 2 View 03/09/2012  *RADIOLOGY REPORT*  Clinical Data: Chest burning status post fall.  History of stents.  CHEST - 2 VIEW  Comparison: 02/28/2012 and 01/23/2012 radiographs.  Findings: There are slightly lower lung volumes.  The heart size and mediastinal contours are stable status post CABG.  There is central airway thickening with mildly increased basilar atelectasis.  No confluent airspace opacity, edema or significant pleural effusion is present.  IMPRESSION: Mild atelectasis superimposed on chronic central airway thickening. No acute cardiopulmonary process.   Original Report Authenticated By: Gerrianne Scale, M.D.     Current Medications:     . sodium chloride   Intravenous Once  . aspirin  324 mg Oral Once  . aspirin EC  81 mg Oral Daily  . atorvastatin  20 mg Oral q1800  . calcium carbonate  1 tablet Oral TID PC & HS  . carvedilol  6.25 mg Oral BID WC  . clopidogrel  75 mg Oral Q breakfast  .  docusate sodium  100 mg Oral QHS  . escitalopram  5 mg Oral Daily  . heparin  4,000 Units Intravenous Once  . linagliptin  5 mg Oral Daily  . meclizine  25 mg Oral Daily  . multivitamin with minerals  1 tablet Oral Daily  . nitroGLYCERIN  1 inch Topical Once  . pantoprazole  40 mg Oral Q1200  . ramipril  5 mg Oral Daily  . Tamsulosin HCl  0.4 mg Oral QHS  . DISCONTD: Docusate Sodium  100 mg Oral QHS      . heparin 1,150 Units/hr (03/09/12 2100)  . nitroGLYCERIN 5 mcg/min (03/09/12 2230)  . DISCONTD: sodium chloride 75 mL/hr at 03/09/12 2100    ASSESSMENT AND PLAN: Principal Problem:  *Non-ST elevation myocardial infarction (NSTEMI), initial care - cath today, MD discussed with patient, he is on the board. Continue current Rx.  Active Problems:  HYPERLIPIDEMIA, MIXED - profile pending, continue  statin   GERD - continue PPI   HTN (hypertension) - good control on current Rx  DM - glucophage held, on onglyza, add SSI, nutrition to see  Social issues - will ask care management to see, pt lives alone, dtr/son-in-law have health issues, pt fell and may need "Life alert" or similar plus safety assessment +/- HHRN.  Signed, Theodore Demark , PA-C 6:56 AM 03/10/2012  I have personally seen and examined the patient.  He presents with some chest discomfort, suggestive of cardiac pain,after a fall. His cardiac enzymes are up, and this could be from demand ischemia, but a new issue related to his grafts cannot be excluded.  He clearly had sources for demand ischemia, with an occluded RCA graft with collateralized vessels, as well as OM2.  However, a new issue cannot be excluded.  Thus cardiac catheterization would seem to be appropriate.  The patient is agreeable to proceed.

## 2012-03-11 ENCOUNTER — Encounter: Payer: Medicare Other | Admitting: Adult Health

## 2012-03-11 LAB — GLUCOSE, CAPILLARY
Glucose-Capillary: 173 mg/dL — ABNORMAL HIGH (ref 70–99)
Glucose-Capillary: 223 mg/dL — ABNORMAL HIGH (ref 70–99)

## 2012-03-11 LAB — BASIC METABOLIC PANEL
CO2: 27 mEq/L (ref 19–32)
Chloride: 100 mEq/L (ref 96–112)
GFR calc Af Amer: >90 mL/min (ref >90)
Potassium: 4 mEq/L (ref 3.5–5.1)

## 2012-03-11 LAB — TROPONIN I: Troponin I: 0.73 ng/mL (ref <0.30)

## 2012-03-11 LAB — CBC
HCT: 37.3 % — ABNORMAL LOW (ref 39.0–52.0)
MCV: 88 fL (ref 78.0–100.0)
Platelets: 141 10*3/uL — ABNORMAL LOW (ref 150–400)
RBC: 4.24 MIL/uL (ref 4.22–5.81)
RDW: 13.5 % (ref 11.5–15.5)
WBC: 8.2 10*3/uL (ref 4.0–10.5)

## 2012-03-11 LAB — CK TOTAL AND CKMB (NOT AT ARMC)
CK, MB: 8 ng/mL (ref 0.3–4.0)
Relative Index: 3.9 — ABNORMAL HIGH (ref 0.0–2.5)

## 2012-03-11 MED ORDER — MORPHINE SULFATE 2 MG/ML IJ SOLN
1.0000 mg | Freq: Once | INTRAMUSCULAR | Status: AC
  Administered 2012-03-11: 1 mg via INTRAVENOUS

## 2012-03-11 MED ORDER — MORPHINE SULFATE 2 MG/ML IJ SOLN: 1.0000 mg | Freq: Once | INTRAMUSCULAR | Status: DC

## 2012-03-11 MED ORDER — DIAZEPAM 2 MG PO TABS: 2.0000 mg | ORAL_TABLET | ORAL | Status: DC | PRN

## 2012-03-11 MED ORDER — ONDANSETRON HCL 4 MG/2ML IJ SOLN: 4.0000 mg | Freq: Four times a day (QID) | INTRAMUSCULAR | Status: DC | PRN

## 2012-03-11 MED ORDER — SODIUM CHLORIDE 0.9 % IJ SOLN
3.0000 mL | Freq: Two times a day (BID) | INTRAMUSCULAR | Status: DC
  Administered 2012-03-11: 3 mL via INTRAVENOUS

## 2012-03-11 MED ORDER — OXYCODONE-ACETAMINOPHEN 5-325 MG PO TABS: 1.0000 | ORAL_TABLET | ORAL | Status: DC | PRN

## 2012-03-11 MED ORDER — ALUM & MAG HYDROXIDE-SIMETH 200-200-20 MG/5ML PO SUSP
30.0000 mL | ORAL | Status: DC | PRN
  Filled 2012-03-11: qty 30

## 2012-03-11 MED ORDER — SODIUM CHLORIDE 0.9 % IV SOLN: 250.0000 mL | INTRAVENOUS | Status: DC

## 2012-03-11 MED ORDER — SODIUM CHLORIDE 0.9 % IJ SOLN: 3.0000 mL | INTRAMUSCULAR | Status: DC | PRN

## 2012-03-11 MED ORDER — SODIUM CHLORIDE 0.9 % IV SOLN
1.0000 mL/kg/h | INTRAVENOUS | Status: AC
  Administered 2012-03-10: 1 mL/kg/h via INTRAVENOUS

## 2012-03-11 MED ORDER — ACETAMINOPHEN 325 MG PO TABS: 650.0000 mg | ORAL_TABLET | ORAL | Status: DC | PRN

## 2012-03-11 MED ORDER — MORPHINE SULFATE 2 MG/ML IJ SOLN
INTRAMUSCULAR | Status: AC
  Filled 2012-03-11: qty 1

## 2012-03-11 MED ORDER — TICAGRELOR 90 MG PO TABS
90.0000 mg | ORAL_TABLET | Freq: Two times a day (BID) | ORAL | Status: DC
  Administered 2012-03-11 – 2012-03-12 (×3): 90 mg via ORAL
  Filled 2012-03-11 (×4): qty 1

## 2012-03-11 MED FILL — Dextrose Inj 5%: INTRAVENOUS | Qty: 50 | Status: AC

## 2012-03-11 NOTE — Progress Notes (Signed)
Patient c/o chest pain after walking from the bathroom.Rates 5/10 SL nitro given BP 171/77 NSR 77. No relief second nitro SL given. I will page MD and continue to monitor

## 2012-03-11 NOTE — Progress Notes (Signed)
Noted events this am. RN requests holding ambulation. Will walk later today after enzymes are back.  Ethelda Chick CES, ACSM

## 2012-03-11 NOTE — Progress Notes (Signed)
Patient denies chest after morphine given patient instructed to call if he has anymore. Damita Dunnings was here and assessed the patient and MD will be up also.

## 2012-03-11 NOTE — Progress Notes (Signed)
Patient Name: Jeffrey [name] Date of Encounter: 03/11/2012     Principal Problem:  *Non-ST elevation myocardial infarction (NSTEMI), initial care episode Active Problems:  CAD (coronary artery disease)  HTN (hypertension)  DM (diabetes mellitus), type 2, uncontrolled  Hyperlipidemia  GERD  Dementia  Postural dizziness    SUBJECTIVE  ctsp 2/2 5/10 chest pain w/o associated Ss.  Pt says pain is different from previous angina.  Typical angina described as "indigestion" whereas current discomfort is more of a tightness affecting his upper epigastric area, mid and right chest.  Denies sob, n, v, diaph.  CURRENT MEDS    . aspirin EC  81 mg Oral Daily  . atorvastatin  20 mg Oral q1800  . bivalirudin      . calcium carbonate  1 tablet Oral TID PC & HS  . carvedilol  6.25 mg Oral BID WC  . diazepam  5 mg Oral On Call  . docusate sodium  100 mg Oral QHS  . escitalopram  5 mg Oral Daily  . fentaNYL      . heparin      . insulin aspart  0-15 Units Subcutaneous TID WC  . lidocaine      . linagliptin  5 mg Oral Daily  . meclizine  25 mg Oral Daily  . midazolam      . midazolam      .  morphine injection  1 mg Intravenous Once  . morphine      . multivitamin with minerals  1 tablet Oral Daily  . nitroGLYCERIN      . pantoprazole  40 mg Oral Q1200  . ramipril  5 mg Oral Daily  . sodium chloride  3 mL Intravenous Q12H  . Tamsulosin HCl  0.4 mg Oral QHS  . Ticagrelor      . Ticagrelor  90 mg Oral BID  . DISCONTD: aspirin  324 mg Oral Pre-Cath  . DISCONTD: clopidogrel  75 mg Oral Q breakfast  . DISCONTD: sodium chloride  3 mL Intravenous Q12H    OBJECTIVE  Filed Vitals:   03/10/12 2030 03/10/12 2200 03/11/12 0020 03/11/12 0535  BP: 124/69 134/47 141/73 153/64  Pulse: 66 63 67 78  Temp:   98.4 F (36.9 C) 98.3 F (36.8 C)  TempSrc:   Oral Oral  Resp: 19 14 15 19   Height:      Weight:    175 lb 8 oz (79.606 kg)  SpO2: 99% 100% 98% 98%    Intake/Output Summary  (Last 24 hours) at 03/11/12 0720 Last data filed at 03/11/12 0535  Gross per 24 hour  Intake    830 ml  Output   2400 ml  Net  -1570 ml   Filed Weights   03/10/12 0700 03/10/12 1057 03/11/12 0535  Weight: 173 lb 1 oz (78.5 kg) 173 lb 1 oz (78.5 kg) 175 lb 8 oz (79.606 kg)    PHYSICAL EXAM  General: Pleasant, NAD. Neuro: Alert and oriented X 3. Moves all extremities spontaneously. Psych: Normal affect. HEENT:  Normal  Neck: Supple without bruits or JVD. Lungs:  Resp regular and unlabored, CTA. Heart: RRR no s3, s4, or murmurs. Abdomen: Soft, non-tender, non-distended, BS + x 4.  Extremities: No clubbing, cyanosis or edema. R groin w/o bleeding, hematoma.  Bruit noted.  R DP/PT 1+/2+ respectively.  Accessory Clinical Findings  CBC  Basename 03/10/12 0615 03/09/12 2300  WBC 5.9 6.8  NEUTROABS -- 4.1  HGB 12.5* 12.2*  HCT  37.8* 36.6*  MCV 88.3 87.8  PLT 138* 144*   Basic Metabolic Panel  Basename 03/10/12 0600 03/09/12 2300  NA 134* 136  K 4.1 3.5  CL 100 101  CO2 27 26  GLUCOSE 173* 157*  BUN 9 10  CREATININE 0.85 0.88  CALCIUM 9.3 9.4  MG -- --  PHOS -- --   Liver Function Tests  Basename 03/09/12 2300 03/09/12 1140  AST 22 25  ALT 20 23  ALKPHOS 63 68  BILITOT 0.2* 0.3  PROT 6.4 6.7  ALBUMIN 3.5 3.8    Basename 03/09/12 1140  LIPASE 48  AMYLASE --   Cardiac Enzymes  Basename 03/10/12 0954 03/10/12 0600 03/09/12 2300  CKTOTAL 262* 283* 346*  CKMB 9.3* 10.3* 12.4*  CKMBINDEX -- -- --  TROPONINI 0.49* 0.46* 0.52*   Hemoglobin A1C  Basename 03/09/12 2300  HGBA1C 7.4*   Fasting Lipid Panel  Basename 03/10/12 0600  CHOL 135  HDL 41  LDLCALC 49  TRIG 227*  CHOLHDL 3.3  LDLDIRECT --    TELE  rsr  ECG  Rsr, 69, lat st dep w/ biphasic-inverted t's, small inf q's.  Not acutely changed c/w post-pci ecg.  ASSESSMENT AND PLAN  1.  NSTEMI/CAD/Recurrent Chest Pain:  Now resolved following 2 sl NTG and ms04 2mg  IV.  ECG unchanged.   Doubt acute stent thrombosis.  Will recycle CE.  Cont asa, statin, brlinta, bb/acei.  If pain recurs or CE turn markedly +, will need relook cath.  2.  HTN:  Stable.  3.  HL:  LDL 49.  Cont statin.  LFT's ok.  4.  DM:  Cont insulin.  Signed, Nicolasa Ducking NP  Patient seen, examined. Available data reviewed. Agree with findings, assessment, and plan as outlined by [name] NP. The patient is s/p PCI of a vein graft last PM, had recurrent CP this am, but feels well now. He is sitting up eating. His cardiac enzymes are essentially unchanged. Will observe another 24 hours and likely discharge tomorrow on current meds.  [name], M.D. 03/11/2012 1:24 PM

## 2012-03-11 NOTE — Evaluation (Signed)
Physical Therapy Evaluation Patient Details Name: Jeffrey [name] MRN: [id number] DOB: 1929/02/22 Today's Date: 03/11/2012 Time: 9562-1308 PT Time Calculation (min): 29 min  PT Assessment / Plan / Recommendation Clinical Impression  Pt. was admitted with NSTEMI, s/p cardiac cath and episode of chest pain this am.  RN reports EKG following chest pain was negative.  Pt's NP [name] (cardiology) requested pt. up with PT today despite episode of chest pain.  Pt. currently at decreased functional status due to recent events and will benefit from progressive PT to address.      PT Assessment  Patient needs continued PT services    Follow Up Recommendations  Other (comment) (? cardiac rehab when cleared by MD)    Barriers to Discharge Decreased caregiver support Pt. lives alone, may need more assist at Dc depending on his progression    Equipment Recommendations  Other (comment) (to be determined once he is walking)    Recommendations for Other Services     Frequency Min 3X/week    Precautions / Restrictions Precautions Precaution Comments: pt. had episode of chest pain this am, now resolved.  Checked with Kathreen Cornfield, RN for this pt. about whether to initiate PT this am.  She and I went and checked with [name] Np for cardiology who wants pt. to proceed with PT this am, despite earlier episode of chest pain. Restrictions Weight Bearing Restrictions: No   Pertinent Vitals/Pain Denies pain, HR stable in 70's throughout transfers      Mobility  Bed Mobility Bed Mobility: Supine to Sit;Sit to Supine Supine to Sit: 6: Modified independent (Device/Increase time);With rails;HOB flat Sit to Supine: Not Tested (comment) Details for Bed Mobility Assistance: pt. managed himself up to EOB without problem and use of rail Transfers Transfers: Sit to Stand;Stand to Sit Sit to Stand: 6: Modified independent (Device/Increase time) Stand to Sit: 6: Modified independent (Device/Increase  time) Stand Pivot Transfers: 5: Supervision Details for Transfer Assistance: pt. able to complete transitional movements without trouble at mod I level.  Transferred to chair with supervision for safety. Ambulation/Gait Ambulation/Gait Assistance: Not tested (comment) Assistive device: None    Exercises     PT Diagnosis: Difficulty walking  PT Problem List: Decreased activity tolerance;Cardiopulmonary status limiting activity;Decreased knowledge of precautions;Decreased mobility PT Treatment Interventions: DME instruction;Gait training;Functional mobility training;Therapeutic activities;Patient/family education   PT Goals Acute Rehab PT Goals PT Goal Formulation: With patient Time For Goal Achievement: 03/18/12 Potential to Achieve Goals: Good Pt will go Supine/Side to Sit: Independently PT Goal: Supine/Side to Sit - Progress: Goal set today Pt will go Sit to Supine/Side: Independently PT Goal: Sit to Supine/Side - Progress: Goal set today Pt will go Sit to Stand: Independently PT Goal: Sit to Stand - Progress: Goal set today Pt will go Stand to Sit: Independently PT Goal: Stand to Sit - Progress: Goal set today Pt will Transfer Bed to Chair/Chair to Bed: Independently PT Transfer Goal: Bed to Chair/Chair to Bed - Progress: Goal set today Pt will Ambulate: 51 - 150 feet;Independently PT Goal: Ambulate - Progress: Goal set today  Visit Information  Last PT Received On: 03/11/12 Assistance Needed: +1    Subjective Data  Subjective: I live by myself Patient Stated Goal: "to take care of myself"   Prior Functioning  Home Living Lives With: Alone Available Help at Discharge: Available PRN/intermittently;Family;Other (Comment) (duaghter) Type of Home: Apartment Home Access: Level entry Home Layout: One level Bathroom Shower/Tub: Tub/shower unit;Curtain Firefighter: Standard Home Adaptive Equipment: None  Prior Function Level of Independence: Independent Able to Take  Stairs?: Yes Driving: Yes Vocation: Retired Musician: No difficulties Dominant Hand: Right    Cognition  Overall Cognitive Status: Appears within functional limits for tasks assessed/performed Arousal/Alertness: Awake/alert Orientation Level: Appears intact for tasks assessed Behavior During Session: [facility] for tasks performed    Extremity/Trunk Assessment Right Upper Extremity Assessment RUE ROM/Strength/Tone: [facility] for tasks assessed Left Upper Extremity Assessment LUE ROM/Strength/Tone: WFL for tasks assessed Right Lower Extremity Assessment RLE ROM/Strength/Tone: [facility] for tasks assessed Left Lower Extremity Assessment LLE ROM/Strength/Tone: WFL for tasks assessed Trunk Assessment Trunk Assessment: Normal   Balance    End of Session PT - End of Session Activity Tolerance: Patient tolerated treatment well Patient left: in chair;with call bell/phone within reach;Other (comment) (RN made aware) Nurse Communication: Mobility status  GP     Ferman Hamming 03/11/2012, 3:01 PM Weldon Picking PT Acute Rehab Services [phone] Beeper [phone]

## 2012-03-12 ENCOUNTER — Encounter (HOSPITAL_COMMUNITY): Payer: Self-pay | Admitting: Nurse Practitioner

## 2012-03-12 MED ORDER — TICAGRELOR 90 MG PO TABS: 90.0000 mg | ORAL_TABLET | Freq: Two times a day (BID) | ORAL | Status: DC

## 2012-03-12 MED ORDER — NITROGLYCERIN 0.4 MG SL SUBL: 0.4000 mg | SUBLINGUAL_TABLET | SUBLINGUAL | Status: DC | PRN

## 2012-03-12 MED ORDER — METFORMIN HCL 500 MG PO TABS: 500.0000 mg | ORAL_TABLET | Freq: Two times a day (BID) | ORAL | Status: DC

## 2012-03-12 MED ORDER — ASPIRIN 81 MG PO TABS: 81.0000 mg | ORAL_TABLET | Freq: Every day | ORAL | Status: DC

## 2012-03-12 NOTE — Progress Notes (Signed)
CARDIAC REHAB PHASE I   PRE:  Rate/Rhythm: 68 SR    BP: sitting 145/66    SaO2: 97 RA  MODE:  Ambulation: 500 ft   POST:  Rate/Rhythm: 81 SR    BP: sitting 129/76     SaO2: 98 RA  Pt feels better this am. Able to walk long distance although is unsteady when holding on to arm. At one point pt started crossing feet. Pt sts later that he is steadier when his arm isn't being held. Pt demands on being independent. Drives, eats [employer] most days. Drove himself to the hospital instead of taking NTG. Discussed this but pt is resistant. Insists on doing things his way so he can remain independent. Declines CRPII due to orthopedic issues. Sts he takes many asa a day. Attempted to discuss this.  [id number]  Harriet Masson CES, ACSM

## 2012-03-12 NOTE — Discharge Summary (Signed)
Patient ID: Jeffrey [name],  MRN: [id number], DOB/AGE: [birth year] 78 y.o.  Admit date: 03/09/2012 Discharge date: 03/12/2012  Primary Care Provider: Cassell Smiles Primary Cardiologist: R. Rothbart, MD  Discharge Diagnoses Principal Problem:  *Non-ST elevation myocardial infarction (NSTEMI), initial care episode  **s/p PCI/DES to VG->OM1 this admission. Active Problems:  CAD (coronary artery disease)  HTN (hypertension)  DM (diabetes mellitus), type 2, uncontrolled  Hyperlipidemia  GERD  Dementia  Postural dizziness  Allergies No Known Allergies  Procedures  Cardiac Catheterization and Percutaneous Coronary Intervention 03/10/2012  1. Left main; 40% distal   2. LAD; 99% proximal followed by a long 75% segmental proximal. 3. Left circumflex; occluded in the midportion.   4. Right coronary artery; dominant and occluded in the midportion 5.LIMA TO LAD; patent 6. SVG TO RCA was occluded at the origin which is an old finding     SVG TO OM1->OM 2 and was patent with occluded continuation. There was a proximal stent in the vein graft there was noted to have a 99% in-stent restenosis.   **The VG->OM1 ISR was restented with a 2.75 x 18 mm Resolute DES**    7. Left ventriculography; RAO left ventriculogram was performed using 25 mL of Visipaque dye at 12 mL/second. The overall LVEF estimated 45 %  With wall motion abnormalities notable for moderate inferobasal hypokinesia _____________ History of Present Illness  78 year old male with prior history of coronary artery disease status post coronary artery bypass grafting in 1989 with repeat percutaneous interventions performed within the vein graft to the OM1/OM 2 in the past.  Within the past 2 weeks, patient had multiple episodes of chest pain with evaluations in the emergency room on 2 occasions with unrevealing workup and subsequent discharge. On the day of admission, patient fell while walking up the steps and immediately began to  experience chest burning. He presented to the [facility] where ECG was nonacute however his troponin was elevated at  0.72, with a CK of 44, and MB of 16.6. He was admitted for further evaluation and management of non-ST segment elevation myocardial infarction.   Hospital Course  Following admission, patient's cardiac markers began to trend in a downward fashion. It was felt that he would require diagnostic catheterization and this was subsequently performed on August 28 revealing severe multivessel coronary artery disease with a new 99% in-stent restenosis within the proximal vein graft to the OM1. The LIMA to the LAD was patent, while his vein graft to the right coronary artery, and continuation graft to the OM 2 (off of the vein graft to the OM1), were known to be occluded. The in-stent stenosis was felt to be responsible for his infarct and this was successfully treated with a 2.75 x 18 mm resolute drug-eluting stent. Patient tolerated procedure well however on the morning of August 29 had recurrent chest discomfort without ST segment changes. Chest pain resolved following sublingual nitroglycerin and morphine. Cardiac markers were evaluated and showed continued downward trend of CK-MB and it was not felt that this represented an acute stent thrombosis. Following that episode, patient has had no further chest discomfort. We plan to discharge him home today in good condition. We have switched him from Plavix to Brilinta therapy given his history of previous restenosis within his vein graft to the marginal branches.    Discharge Vitals Blood pressure 145/66, pulse 74, temperature 98.7 F (37.1 C), temperature source Oral, resp. rate 16, height 5\' 10"  (1.778 m), weight 172 lb 6.4 oz (78.2  kg), SpO2 97.00%.  Filed Weights   03/10/12 1057 03/11/12 0535 03/12/12 0420  Weight: 173 lb 1 oz (78.5 kg) 175 lb 8 oz (79.606 kg) 172 lb 6.4 oz (78.2 kg)   Labs  CBC  Basename 03/11/12 0816 03/10/12 0615  03/09/12 2300  WBC 8.2 5.9 --  NEUTROABS -- -- 4.1  HGB 12.4* 12.5* --  HCT 37.3* 37.8* --  MCV 88.0 88.3 --  PLT 141* 138* --   Basic Metabolic Panel  Basename 03/11/12 0816 03/10/12 0600  NA 133* 134*  K 4.0 4.1  CL 100 100  CO2 27 27  GLUCOSE 173* 173*  BUN 8 9  CREATININE 0.90 0.85  CALCIUM 9.0 9.3  MG -- --  PHOS -- --   Liver Function Tests  Basename 03/09/12 2300 03/09/12 1140  AST 22 25  ALT 20 23  ALKPHOS 63 68  BILITOT 0.2* 0.3  PROT 6.4 6.7  ALBUMIN 3.5 3.8    Basename 03/09/12 1140  LIPASE 48  AMYLASE --   Cardiac Enzymes  Basename 03/11/12 1000 03/10/12 0954 03/10/12 0600  CKTOTAL 206 262* 283*  CKMB 8.0* 9.3* 10.3*  CKMBINDEX -- -- --  TROPONINI 0.73* 0.49* 0.46*   Hemoglobin A1C  Basename 03/09/12 2300  HGBA1C 7.4*   Fasting Lipid Panel  Basename 03/10/12 0600  CHOL 135  HDL 41  LDLCALC 49  TRIG 227*  CHOLHDL 3.3  LDLDIRECT --   Disposition  Pt is being discharged home today in good condition.  Follow-up Plans & Appointments  Follow-up Information    Follow up with Joni Reining, NP on 03/19/2012. (11:20 AM)    Contact information:   [address], [address] [place] [address] [phone]       Follow up with [name], MD. (as scheduled.)    Contact information:   [address] [place] [address] [phone]        Discharge Medications  Medication List  As of 03/12/2012 10:57 AM   STOP taking these medications         clopidogrel 75 MG tablet         TAKE these medications         albuterol 108 (90 BASE) MCG/ACT inhaler   Commonly known as: PROVENTIL HFA;VENTOLIN HFA   Inhale 1-2 puffs into the lungs every 6 (six) hours as needed.      aspirin 81 MG tablet   Take 1 tablet (81 mg total) by mouth daily. For HEART/BLOOD THINNER/PAIN      B-COMPLEX PO   Take 1 tablet by mouth daily.      calcium carbonate 500 MG  chewable tablet   Commonly known as: TUMS - dosed in mg elemental calcium   Chew 1 tablet by mouth 4 (four) times daily - after meals and at bedtime.      carvedilol 6.25 MG tablet   Commonly known as: COREG   Take 6.25 mg by mouth 2 (two) times daily with a meal. For blood pressure/heart      escitalopram 5 MG tablet   Commonly known as: LEXAPRO   Take 5 mg by mouth daily. For NERVES/Depression      isosorbide mononitrate 30 MG 24 hr tablet   Commonly known as: IMDUR   TAKE 1 TABLET BY MOUTH DAILY FOR HEART.      meclizine 25 MG tablet   Commonly known as: ANTIVERT   Take 25  mg by mouth daily. For dizziness      metFORMIN 500 MG tablet   Commonly known as: GLUCOPHAGE   Take 1 tablet (500 mg total) by mouth 2 (two) times daily. **RESUME ON 03/13/2012**      multivitamin with minerals Tabs   Take 1 tablet by mouth daily.      nitroGLYCERIN 0.4 MG SL tablet   Commonly known as: NITROSTAT   Place 1 tablet (0.4 mg total) under the tongue every 5 (five) minutes x 3 doses as needed for chest pain.      ONGLYZA 5 MG Tabs tablet   Generic drug: saxagliptin HCl   Take 5 mg by mouth at bedtime.      pantoprazole 40 MG tablet   Commonly known as: PROTONIX   Take 40 mg by mouth every morning.      PEPTO-BISMOL PO   Take by mouth daily as needed. Patient states that he just takes a couple of swallows when needed to control bowels.      ramipril 5 MG capsule   Commonly known as: ALTACE   Take 5 mg by mouth daily. For blood pressure      rosuvastatin 10 MG tablet   Commonly known as: CRESTOR   Take 10 mg by mouth daily.      STOOL SOFTENER 100 MG capsule   Generic drug: Docusate Sodium   Take 100 mg by mouth at bedtime. Constipation      Tamsulosin HCl 0.4 MG Caps   Commonly known as: FLOMAX   Take 0.4 mg by mouth at bedtime. For urinary health      Ticagrelor 90 MG Tabs tablet   Commonly known as: BRILINTA   Take 1 tablet (90 mg total) by mouth 2 (two) times daily.       traMADol 50 MG tablet   Commonly known as: ULTRAM   Take 50 mg by mouth every 8 (eight) hours as needed.           Outstanding Labs/Studies  None  Duration of Discharge Encounter   Greater than 30 minutes including physician time.  Signed, Nicolasa Ducking NP 03/12/2012, 10:57 AM

## 2012-03-12 NOTE — Progress Notes (Signed)
Physical Therapy Treatment Patient Details Name: Jeffrey [name] MRN: [id number] DOB: 24-Nov-1928 Today's Date: 03/12/2012 Time: 4098-1191 PT Time Calculation (min): 10 min  PT Assessment / Plan / Recommendation Comments on Treatment Session  Pt. is mobilizing well and he feels he is at his baseline with mobility.  He could benefit from use of cane but he declines this option.  He says he has a cane already at home that he does not use. Pt. also declines having HHPT to come out for a safety eval of his home setting, even after my explanation that they won't require him to exercise.  He is anticipating Dc home today.       Follow Up Recommendations       Barriers to Discharge        Equipment Recommendations  None recommended by PT    Recommendations for Other Services    Frequency Min 3X/week   Plan Discharge plan remains appropriate    Precautions / Restrictions Precautions Precaution Comments: no chest pain this am Restrictions Weight Bearing Restrictions: No   Pertinent Vitals/Pain Denies pain, HR stable in 70s throughout walk    Mobility  Bed Mobility Bed Mobility: Not assessed (pt. up in room ad lib) Transfers Transfers: Sit to Stand;Stand to Sit Sit to Stand: 7: Independent Stand to Sit: 7: Independent Details for Transfer Assistance: no difficulty noted Ambulation/Gait Ambulation/Gait Assistance: 7: Independent Ambulation Distance (Feet): 150 Feet Assistive device: None Ambulation/Gait Assistance Details: Pt. up ad lib in room, managing his lines well.  He is able to turn 360 degrees in each direction without LOB.  He has slight limp of left LE he reports due to old CVA.  NO overt LOB with ambulation Gait Pattern: Decreased step length - left;Decreased hip/knee flexion - left Gait velocity: near normal    Exercises     PT Diagnosis:    PT Problem List:   PT Treatment Interventions:     PT Goals Acute Rehab PT Goals PT Goal: Sit to Stand - Progress: Met PT  Goal: Stand to Sit - Progress: Met PT Goal: Ambulate - Progress: Met  Visit Information  Last PT Received On: 03/12/12 Assistance Needed: +1    Subjective Data  Subjective: "I've got to get back to taking care of myself"   Cognition  Overall Cognitive Status: Appears within functional limits for tasks assessed/performed Arousal/Alertness: Awake/alert Orientation Level: Appears intact for tasks assessed Behavior During Session: [facility] for tasks performed    Balance     End of Session PT - End of Session Activity Tolerance: Patient tolerated treatment well Patient left: in bed;with call bell/phone within reach;Other (comment) (on bed, sitting at edge) Nurse Communication: Mobility status   GP     Ferman Hamming 03/12/2012, 11:14 AM Weldon Picking PT Acute Rehab Services [phone] Beeper [phone]

## 2012-03-12 NOTE — Progress Notes (Signed)
    Subjective:  The patient denies chest pain or dyspnea. He has no complaints this morning.  Objective:  Vital Signs in the last 24 hours: Temp:  [98.1 F (36.7 C)-99.2 F (37.3 C)] 98.7 F (37.1 C) (08/30 0753) Pulse Rate:  [66-74] 74  (08/30 0753) Resp:  [15-19] 16  (08/30 0753) BP: (111-151)/(52-115) 145/66 mmHg (08/30 0753) SpO2:  [97 %-98 %] 97 % (08/30 0753) Weight:  [78.2 kg (172 lb 6.4 oz)] 78.2 kg (172 lb 6.4 oz) (08/30 0420)  Intake/Output from previous day: 08/29 0701 - 08/30 0700 In: 480 [P.O.:480] Out: 1850 [Urine:1850]  Physical Exam: Pt is alert and oriented, elderly male in NAD HEENT: normal Neck: JVP - normal Lungs: CTA bilaterally CV: RRR without murmur or gallop Abd: soft, NT, Positive BS, no hepatomegaly Ext: no C/C/E, distal pulses intact and equal Skin: warm/dry no rash   Lab Results:  [facility] 03/11/12 0816 03/10/12 0615  WBC 8.2 5.9  HGB 12.4* 12.5*  PLT 141* 138*    Basename 03/11/12 0816 03/10/12 0600  NA 133* 134*  K 4.0 4.1  CL 100 100  CO2 27 27  GLUCOSE 173* 173*  BUN 8 9  CREATININE 0.90 0.85    Basename 03/11/12 1000 03/10/12 0954  TROPONINI 0.73* 0.49*   Tele: Personally reviewed, sinus rhythm without arrhythmia.  Assessment/Plan:  1. Non-ST elevation MI. Patient is status post PCI of his vein graft. He's had multiple recurrent ACS presentations. 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 He is at high risk of recurrence and I have therefore placed him on aspirin and brilinta. I'm going to ask the case manager to evaluate him for brilinta assistance. I have reviewed his medications with him extensively and he seems to be adherent with his medical regimen.  2. Hypertension. Controlled on his current medical program.  3. Diabetes with poor control. The importance of dietary and medicine compliance was reviewed with him. He needs followup with his primary care physician.  4. Hyperlipidemia. The  patient is on Lipitor 20 mg. Cholesterol shows an LDL of 49, HDL 41, total cholesterol 324.  5. Disposition. Home today. Followup with Joni Reining in [place].  [name], M.D. 03/12/2012, 10:08 AM

## 2012-03-18 ENCOUNTER — Other Ambulatory Visit: Payer: Self-pay | Admitting: Adult Health

## 2012-03-19 ENCOUNTER — Encounter: Payer: Self-pay | Admitting: Adult Health

## 2012-03-19 ENCOUNTER — Ambulatory Visit (INDEPENDENT_AMBULATORY_CARE_PROVIDER_SITE_OTHER): Payer: Medicare Other | Admitting: Adult Health

## 2012-03-19 VITALS — BP 120/60 | HR 72 | Ht 70.0 in | Wt 180.2 lb

## 2012-03-19 DIAGNOSIS — I2510 Atherosclerotic heart disease of native coronary artery without angina pectoris: Secondary | ICD-10-CM

## 2012-03-19 DIAGNOSIS — I10 Essential (primary) hypertension: Secondary | ICD-10-CM

## 2012-03-19 NOTE — Progress Notes (Signed)
HPI: [name] is an 78 year old male patient of Dr. Dietrich Pates, with known history of CAD and recent hospitalization at [facility] for non-ST elevation myocardial infarction a troponin level of 0.72.. The patient was in his usual state of health but tripped and fell on the stairs and soon thereafter had some heartburn and indigestion feeling. He called EMS and was brought to [facility] where a cardiac catheterization was completed on 03/10/2012. He was found to have a 99% in-stent restenosis of his SVG to OM1 and OM 2. This was restented with a drug-eluting stent. He was placed on Brilinta and continued on aspirin.    The patient is here posthospitalization followup without any recurrence of chest discomfort or heartburn. He did fall out of bed today which was startling for him. He has done this in the past but it has been about 5 years. He states that soon after his wife died 5 years ago he was falling out of bed but finally get used to sleeping alone. He sustained no injuries, did not hit his head, or have any bruising. He is somewhat concerned about living alone. No Known Allergies  Current Outpatient Prescriptions  Medication Sig Dispense Refill  . albuterol (PROAIR HFA) 108 (90 BASE) MCG/ACT inhaler Inhale 1-2 puffs into the lungs every 6 (six) hours as needed.  1 Inhaler  6  . aspirin 81 MG tablet Take 1 tablet (81 mg total) by mouth daily. For HEART/BLOOD THINNER/PAIN      . B Complex-Biotin-FA (B-COMPLEX PO) Take 1 tablet by mouth daily.       . Bismuth Subsalicylate (PEPTO-BISMOL PO) Take by mouth daily as needed. Patient states that he just takes a couple of swallows when needed to control bowels.       . calcium carbonate (TUMS - DOSED IN MG ELEMENTAL CALCIUM) 500 MG chewable tablet Chew 1 tablet by mouth 4 (four) times daily - after meals and at bedtime.      . carvedilol (COREG) 6.25 MG tablet Take 6.25 mg by mouth 2 (two) times daily with a meal. For blood pressure/heart      . CRESTOR 10 MG  tablet TAKE ONE TABLET BY MOUTH DAILY FOR CHOLESTEROL.  30 each  11  . Docusate Sodium (STOOL SOFTENER) 100 MG capsule Take 100 mg by mouth at bedtime. Constipation      . escitalopram (LEXAPRO) 5 MG tablet Take 5 mg by mouth daily. For NERVES/Depression      . isosorbide mononitrate (IMDUR) 30 MG 24 hr tablet TAKE 1 TABLET BY MOUTH DAILY FOR HEART.  30 tablet  12  . meclizine (ANTIVERT) 25 MG tablet Take 25 mg by mouth daily. For dizziness      . metFORMIN (GLUCOPHAGE) 500 MG tablet Take 1 tablet (500 mg total) by mouth 2 (two) times daily. **RESUME ON 03/13/2012**      . Multiple Vitamin (MULTIVITAMIN WITH MINERALS) TABS Take 1 tablet by mouth daily.      . nitroGLYCERIN (NITROSTAT) 0.4 MG SL tablet Place 1 tablet (0.4 mg total) under the tongue every 5 (five) minutes x 3 doses as needed for chest pain.  25 tablet  3  . pantoprazole (PROTONIX) 40 MG tablet Take 40 mg by mouth every morning.      . ramipril (ALTACE) 5 MG capsule Take 5 mg by mouth daily. For blood pressure      . saxagliptin HCl (ONGLYZA) 5 MG TABS tablet Take 5 mg by mouth at bedtime.       Marland Kitchen  Tamsulosin HCl (FLOMAX) 0.4 MG CAPS Take 0.4 mg by mouth at bedtime. For urinary health      . Ticagrelor (BRILINTA) 90 MG TABS tablet Take 1 tablet (90 mg total) by mouth 2 (two) times daily.  60 tablet  6  . traMADol (ULTRAM) 50 MG tablet Take 50 mg by mouth every 8 (eight) hours as needed.        Past Medical History  Diagnosis Date  . Diabetes mellitus   . Hypercholesterolemia   . Coronary artery disease     a.  s/p CABG x 4 1989, VG->OM1->OM2, VG->RCA, LIMA->LAD;  b. 05/24/10 Cath/PCI:  VG->OM1->OM2 90 in graft (3.0x15 promus) & 90 in distal LCX (2.75x15 Promus);  c. NSTEMI in 04/2011 - Cath occlusion of distal LCX stent and VG->OM2.;  d. 02/2012 NSTEMI Cath/PCI: 3VD, LIMA->LAD ok, VG->RCA 100, VG->OM1 99p (2.75x66mm Resolute DES), continuation to OM2 occluded.  . Diabetes mellitus   . Hypertension   . PUD (peptic ulcer disease)     . Hypercholesteremia   . GERD (gastroesophageal reflux disease)   . Arthritis   . Osteoarthritis   . Stroke   . History of pneumonia     Past Surgical History  Procedure Date  . Coronary angioplasty with stent placement   . Tonsillectomy   . Post aortocoronary bypass surgery   . Coronary artery bypass graft   . Coronary stent placement   . Coronary angioplasty     HYQ:MVHQIO of systems complete and found to be negative unless listed above  PHYSICAL EXAM BP 120/60  Pulse 72  Ht 5\' 10"  (1.778 m)  Wt 180 lb 4 oz (81.761 kg)  BMI 25.86 kg/m2  General: Well developed, well nourished, in no acute distress Head: Eyes PERRLA, Positive for xanthomas.   Normal cephalic and atramatic  Lungs: Clear bilaterally to auscultation and percussion. Heart: HRRR S1 S2, without MRG, distant heart sounds..  Pulses are 2+ & equal.            No carotid bruit. No JVD.  No abdominal bruits. No femoral bruits. Abdomen: Bowel sounds are positive, abdomen soft and non-tender without masses or                  Hernia's noted. Msk:  Back normal, normal gait. Normal strength and tone for age. Extremities: No clubbing, cyanosis or edema.  DP +1 Neuro: Alert and oriented X 3. Hard of hearing. Psych:  Good affect, responds appropriately    ASSESSMENT AND PLAN

## 2012-03-19 NOTE — Assessment & Plan Note (Signed)
Blood pressure is currently controlled. We will not make any changes on this medication at this time. He will need to have followup the mat, and CBC on next visit to evaluate for kidney function and anemia with use of both Brilinta and aspirin.

## 2012-03-19 NOTE — Assessment & Plan Note (Signed)
He is without cardiac complaints at this time. He he continues medically compliant. He denies any bleeding, tarry stools, or headaches.  I concerned about her living alone at this time. He states that he is fallen out of bed overnight last night, did not sustain any injuries. He is family members living in [place] but his daughter is disabled due to vision issues and is unable to drive to come and check on him. He has expressed some concern about living alone as well, but is uncertain what the next step is. I discussed with him the possibility of an assisted living, and have asked social services to bring him a list of assisted living facilities to review on his own and make a decision whether not this will work for him, or if he is willing to move forward with this. Unfortunately when he was being checked out by RN, he denied requesting information about assisted living. We have a copy of assisted living facilities here in our office which will be made available to him on next visit should he bring this up again.  He will have close followup in our office in one month with Dr. Dietrich Pates. He has not seen Dr. Dietrich Pates in several visits,

## 2012-03-19 NOTE — Patient Instructions (Addendum)
Your physician recommends that you schedule a follow-up appointment in 1 month with Dr. Rothbart.  

## 2012-03-20 ENCOUNTER — Encounter (HOSPITAL_COMMUNITY): Payer: Self-pay

## 2012-03-20 ENCOUNTER — Emergency Department (HOSPITAL_COMMUNITY)
Admission: EM | Admit: 2012-03-20 | Discharge: 2012-03-20 | Disposition: A | Discharge status: Home / Self Care | Payer: Medicare Other | Attending: Emergency Medicine | Admitting: Emergency Medicine

## 2012-03-20 ENCOUNTER — Emergency Department (HOSPITAL_COMMUNITY): Payer: Medicare Other

## 2012-03-20 DIAGNOSIS — I10 Essential (primary) hypertension: Secondary | ICD-10-CM | POA: Insufficient documentation

## 2012-03-20 DIAGNOSIS — R0602 Shortness of breath: Secondary | ICD-10-CM | POA: Insufficient documentation

## 2012-03-20 DIAGNOSIS — R531 Weakness: Secondary | ICD-10-CM

## 2012-03-20 DIAGNOSIS — E119 Type 2 diabetes mellitus without complications: Secondary | ICD-10-CM | POA: Insufficient documentation

## 2012-03-20 DIAGNOSIS — K219 Gastro-esophageal reflux disease without esophagitis: Secondary | ICD-10-CM | POA: Insufficient documentation

## 2012-03-20 DIAGNOSIS — Z79899 Other long term (current) drug therapy: Secondary | ICD-10-CM | POA: Insufficient documentation

## 2012-03-20 DIAGNOSIS — R5381 Other malaise: Secondary | ICD-10-CM | POA: Insufficient documentation

## 2012-03-20 DIAGNOSIS — R42 Dizziness and giddiness: Secondary | ICD-10-CM | POA: Insufficient documentation

## 2012-03-20 DIAGNOSIS — Z951 Presence of aortocoronary bypass graft: Secondary | ICD-10-CM | POA: Insufficient documentation

## 2012-03-20 DIAGNOSIS — R079 Chest pain, unspecified: Secondary | ICD-10-CM | POA: Insufficient documentation

## 2012-03-20 DIAGNOSIS — I2510 Atherosclerotic heart disease of native coronary artery without angina pectoris: Secondary | ICD-10-CM | POA: Insufficient documentation

## 2012-03-20 DIAGNOSIS — Z8673 Personal history of transient ischemic attack (TIA), and cerebral infarction without residual deficits: Secondary | ICD-10-CM | POA: Insufficient documentation

## 2012-03-20 DIAGNOSIS — E7800 Pure hypercholesterolemia, unspecified: Secondary | ICD-10-CM | POA: Insufficient documentation

## 2012-03-20 LAB — CBC WITH DIFFERENTIAL/PLATELET
Eosinophils Absolute: 0.3 10*3/uL (ref 0.0–0.7)
Eosinophils Relative: 3 % (ref 0–5)
HCT: 39 % (ref 39.0–52.0)
Lymphocytes Relative: 9 % — ABNORMAL LOW (ref 12–46)
Lymphs Abs: 1 10*3/uL (ref 0.7–4.0)
MCH: 29.7 pg (ref 26.0–34.0)
MCV: 89 fL (ref 78.0–100.0)
Monocytes Absolute: 0.9 10*3/uL (ref 0.1–1.0)
Monocytes Relative: 8 % (ref 3–12)
RBC: 4.38 MIL/uL (ref 4.22–5.81)
WBC: 10.2 10*3/uL (ref 4.0–10.5)

## 2012-03-20 LAB — BASIC METABOLIC PANEL
BUN: 12 mg/dL (ref 6–23)
CO2: 24 mEq/L (ref 19–32)
Calcium: 9.9 mg/dL (ref 8.4–10.5)
Chloride: 95 mEq/L — ABNORMAL LOW (ref 96–112)
Creatinine, Ser: 0.93 mg/dL (ref 0.50–1.35)
Glucose, Bld: 199 mg/dL — ABNORMAL HIGH (ref 70–99)

## 2012-03-20 LAB — TROPONIN I: Troponin I: <0.3 ng/mL (ref <0.30)

## 2012-03-20 LAB — GLUCOSE, CAPILLARY: Glucose-Capillary: 144 mg/dL — ABNORMAL HIGH (ref 70–99)

## 2012-03-20 MED ORDER — ASPIRIN 81 MG PO CHEW
324.0000 mg | CHEWABLE_TABLET | Freq: Once | ORAL | Status: DC
  Filled 2012-03-20: qty 4

## 2012-03-20 MED ORDER — SODIUM CHLORIDE 0.9 % IV BOLUS (SEPSIS)
500.0000 mL | Freq: Once | INTRAVENOUS | Status: AC
  Administered 2012-03-20: 1000 mL via INTRAVENOUS

## 2012-03-20 NOTE — ED Notes (Signed)
Dr. Rulon Abide in room with pt

## 2012-03-20 NOTE — ED Notes (Addendum)
Pt c/o mid center chest pain that occurred last night, did try to take tums without relief, did take a nitro with improvement in chest pain, pt states that he went to bed and woke up this am with dizziness. Weakness. Pt reports "some" sob, did use his inhaler. Felt better after use of inhaler.

## 2012-03-20 NOTE — ED Provider Notes (Signed)
Medical screening examination/treatment/procedure(s) were performed by non-physician practitioner and as supervising physician I was immediately available for consultation/collaboration.  John-Adam Nabeel Gladson, M.D.     John-Adam Breck Hollinger, MD 03/20/12 1639 

## 2012-03-20 NOTE — ED Notes (Signed)
Julie, PA in room with pt at present time,  

## 2012-03-20 NOTE — ED Provider Notes (Signed)
This chart was scribed for Jones Skene, MD by Bennett Scrape. This patient was seen in room APA06/APA06 and the patient's care was started at 2:44PM.  Was seen in conjunction with PA.  Jeffrey [name] is a 78 y.o. male who states that he has prior episodes of weakness and dizziness attributed to heart problems. States that CP is not usually associated with these episodes. Prior CP has been attributed to indigestion. Told by PCP that if CP doesn't clear with Tums then he needs to be evaluated.  Reports that he took 2 nitroglycerin and one ASA with mild improvement in symptoms. Says he's had no chest pain today, he had some dizziness today that was transient, but has had fatigue since last week. Patient was tired this morning and is tired more than usual which is why he came to the emergency department. He admits to being somewhat ashamedly, he was afraid due to his recent stent.  Patient then says that he was up this morning checking the fluids in his vehicle without a problem. He went to the grocery store yesterday and felt fine. He was a little bit tired at the end of that excursion. Patient was able to get up walk around the department did have some mild fatigue. Discussed at length the patient that he had a fairly significant procedure 6 days ago with a heart catheterization, these got to negative troponins and normal EKG, do not think any of this is related to his heart. He hasn't had any chest pain. Chest pain was last night about 9:00 at which point he went to sleep - troponins were negative x2.  Patient's coronary arteries were seen last week and intervened on, there is nothing else to do for this patient to the hospital. Patient is feeling finally discharged home in good condition. He is given explicit return precautions to the ER.  Jones Skene, MD 03/20/12 1624

## 2012-03-20 NOTE — ED Provider Notes (Addendum)
History     CSN: [id number]  Arrival date & time 03/20/12  2130   First MD Initiated Contact with Patient 03/20/12 [phone]      Chief Complaint  Patient presents with  . Dizziness  . Weakness    (Consider location/radiation/quality/duration/timing/severity/associated sxs/prior treatment) HPI Comments: Jeffrey [name] presents with chest pain, currently resolved,  Occuring yesterday evening at which time he took tums which did not relieve his pain,  Then one nitroglycerin tablet,  4 baby aspirin and his pain eased enough that he was able to fall asleep.  He woke today without chest pain, so is unclear how long the event lasted.  He does report increased weakness and dizziness this morning along with slightly increased shortness of breath.  He took a puff of his albuterol inhaler,  Which maybe helped his breathing some.  Patient does have mild dementia and does not give a clear or concise history,  Frequently bringing up topics concerning his medical history that occurred years ago.  He was admitted on 03/09/12 with a non stemi and underwent catheterization with successful restenting to his saphenous bypass to OM. He took antivert this am x 2 which he states may have helped his dizziness slightly.  Patient lives alone.  The history is provided by the patient.    Past Medical History  Diagnosis Date  . Diabetes mellitus   . Hypercholesterolemia   . Coronary artery disease     a.  s/p CABG x 4 1989, VG->OM1->OM2, VG->RCA, LIMA->LAD;  b. 05/24/10 Cath/PCI:  VG->OM1->OM2 90 in graft (3.0x15 promus) & 90 in distal LCX (2.75x15 Promus);  c. NSTEMI in 04/2011 - Cath occlusion of distal LCX stent and VG->OM2.;  d. 02/2012 NSTEMI Cath/PCI: 3VD, LIMA->LAD ok, VG->RCA 100, VG->OM1 99p (2.75x26mm Resolute DES), continuation to OM2 occluded.  . Diabetes mellitus   . Hypertension   . PUD (peptic ulcer disease)   . Hypercholesteremia   . GERD (gastroesophageal reflux disease)   . Arthritis   .  Osteoarthritis   . Stroke   . History of pneumonia   . Neck fracture   . Back pain     Past Surgical History  Procedure Date  . Coronary angioplasty with stent placement   . Tonsillectomy   . Post aortocoronary bypass surgery   . Coronary artery bypass graft   . Coronary stent placement   . Coronary angioplasty     No family history on file.  History  Substance Use Topics  . Smoking status: Former Smoker    Types: Cigarettes  . Smokeless tobacco: Current User    Types: Chew  . Alcohol Use: No      Review of Systems  Constitutional: Negative for fever.  HENT: Negative for congestion, sore throat and neck pain.   Eyes: Negative.   Respiratory: Positive for shortness of breath. Negative for chest tightness.   Cardiovascular: Positive for chest pain.  Gastrointestinal: Negative for nausea and abdominal pain.  Genitourinary: Negative.   Musculoskeletal: Negative for joint swelling and arthralgias.  Skin: Negative.  Negative for rash and wound.  Neurological: Positive for dizziness and weakness. Negative for light-headedness, numbness and headaches.  Hematological: Negative.   Psychiatric/Behavioral: Negative.     Allergies  Review of patient's allergies indicates no known allergies.  Home Medications   Current Outpatient Rx  Name Route Sig Dispense Refill  . CARVEDILOL 6.25 MG PO TABS Oral Take 6.25 mg by mouth 2 (two) times daily.    . ALBUTEROL SULFATE  HFA 108 (90 BASE) MCG/ACT IN AERS Inhalation Inhale 1-2 puffs into the lungs every 6 (six) hours as needed. 1 Inhaler 6  . B-COMPLEX PO Oral Take 1 tablet by mouth daily.     Marland Kitchen PEPTO-BISMOL PO Oral Take by mouth daily as needed. Patient states that he just takes a couple of swallows when needed to control bowels.     Marland Kitchen CALCIUM CARBONATE ANTACID 500 MG PO CHEW Oral Chew 1 tablet by mouth 4 (four) times daily - after meals and at bedtime.    . CRESTOR 10 MG PO TABS  TAKE ONE TABLET BY MOUTH DAILY FOR CHOLESTEROL. 30  each 11  . DOCUSATE SODIUM 100 MG PO TABS Oral Take 100 mg by mouth at bedtime. Constipation    . ESCITALOPRAM OXALATE 5 MG PO TABS Oral Take 5 mg by mouth daily. For NERVES/Depression    . ISOSORBIDE MONONITRATE ER 30 MG PO TB24  TAKE 1 TABLET BY MOUTH DAILY FOR HEART. 30 tablet 12  . MECLIZINE HCL 25 MG PO TABS Oral Take 25 mg by mouth daily. For dizziness    . METFORMIN HCL 500 MG PO TABS Oral Take 1 tablet (500 mg total) by mouth 2 (two) times daily. **RESUME ON 03/13/2012**    . ADULT MULTIVITAMIN W/MINERALS CH Oral Take 1 tablet by mouth daily.    Marland Kitchen NITROFURANTOIN MONOHYD MACRO 100 MG PO CAPS Oral Take 1 capsule by mouth Daily.    Marland Kitchen NITROGLYCERIN 0.4 MG SL SUBL Sublingual Place 1 tablet (0.4 mg total) under the tongue every 5 (five) minutes x 3 doses as needed for chest pain. 25 tablet 3  . PANTOPRAZOLE SODIUM 40 MG PO TBEC Oral Take 40 mg by mouth every morning.    Marland Kitchen RAMIPRIL 5 MG PO CAPS Oral Take 5 mg by mouth daily. For blood pressure    . SAXAGLIPTIN HCL 5 MG PO TABS Oral Take 5 mg by mouth at bedtime.     . TAMSULOSIN HCL 0.4 MG PO CAPS Oral Take 0.4 mg by mouth at bedtime. For urinary health    . TICAGRELOR 90 MG PO TABS Oral Take 1 tablet (90 mg total) by mouth 2 (two) times daily. 60 tablet 6  . TRAMADOL HCL 50 MG PO TABS Oral Take 50 mg by mouth every 8 (eight) hours as needed.      BP 121/62  Pulse 77  Temp 97.7 F (36.5 C)  Resp 16  Ht 5\' 10"  (1.778 m)  Wt 180 lb (81.647 kg)  BMI 25.83 kg/m2  SpO2 98%  Physical Exam  Nursing note and vitals reviewed. Constitutional: He appears well-developed and well-nourished.  HENT:  Head: Normocephalic and atraumatic.  Eyes: Conjunctivae normal are normal.  Neck: Normal range of motion.  Cardiovascular: Normal rate, regular rhythm, normal heart sounds and intact distal pulses.   Pulmonary/Chest: Effort normal and breath sounds normal. He has no wheezes. He has no rales.  Abdominal: Soft. Bowel sounds are normal. There is no  tenderness.  Musculoskeletal: Normal range of motion. He exhibits no edema.  Neurological: He is alert.  Skin: Skin is warm and dry.  Psychiatric: He has a normal mood and affect.    ED Course  Procedures (including critical care time)  Labs Reviewed  CBC WITH DIFFERENTIAL - Abnormal; Notable for the following:    Neutrophils Relative 79 (*)     Neutro Abs 8.1 (*)     Lymphocytes Relative 9 (*)     All other  components within normal limits  BASIC METABOLIC PANEL - Abnormal; Notable for the following:    Sodium 133 (*)     Chloride 95 (*)     Glucose, Bld 199 (*)     GFR calc non Af Amer 77 (*)     GFR calc Af Amer 89 (*)     All other components within normal limits  GLUCOSE, CAPILLARY - Abnormal; Notable for the following:    Glucose-Capillary 144 (*)     All other components within normal limits  TROPONIN I   Dg Chest Port 1 View  03/20/2012  *RADIOLOGY REPORT*  Clinical Data: Weakness and chest pain  PORTABLE CHEST - 1 VIEW  Comparison: Chest radiograph 03/09/2012 and 01/23/2012  Findings: There are changes of median sternotomy for CABG. Cardiac, mediastinal, and hilar contours are stable.  Minimal linear atelectasis in the left lung base.  The inferior costophrenic angles are excluded from the image.  No visible pleural effusion or airspace disease.  IMPRESSION: Prior CABG.  No acute cardiopulmonary disease   Original Report Authenticated By: Britta Mccreedy, M.D.      No diagnosis found.    MDM    11:00  Spoke with Dr. Ladona Ridgel with [facility].  Recommends observation admission.  Can transfer if rules in,  But recommends Hospitalist admission at this time.  11:33 AM Spoke with Dr. Karilyn Cota with [facility].  Declines admitting pt.  Pt should be at facility that has cardiology support if pt rules in for MI.   Second (3 hour) troponin obtained and remains negative.  Dr. Rulon Abide to see pt and assist with admission of this pt.     Date: 03/20/2012  Rate: 78  Rhythm:  normal sinus rhythm  QRS Axis: normal  Intervals: QT prolonged  ST/T Wave abnormalities: t wave inversion in inferior leads.  More pronounced from 03/27/2012.    Conduction Disutrbances:nonspecific intraventricular conduction delay  Narrative Interpretation:   Old EKG Reviewed: changes noted    Burgess Amor, PA 03/20/12 1503  Burgess Amor, PA 04/21/12 1615

## 2012-03-20 NOTE — ED Notes (Signed)
Pt able to ambulate around the nursing desk twice without any problems

## 2012-03-20 NOTE — ED Notes (Signed)
Dr. Rulon Abide in room talking with pt

## 2012-03-20 NOTE — ED Notes (Signed)
Pt still pending hospital admission, update given to pt, pt denies any complaints at present.

## 2012-03-20 NOTE — ED Notes (Signed)
Diabetic meal tray ordered and given to Pt as requested by Youlanda Mighty, RN.  Pt very appreciative.

## 2012-03-20 NOTE — ED Notes (Addendum)
Pt reports that he took "one big" asa this morning at 7am. Raynelle Fanning PA notified, additional orders given

## 2012-03-20 NOTE — ED Notes (Signed)
Pt reports had chest pain last night, took 1 nitro and some baby aspirin.  Reports nitro relieved chest pain enough that he could go to sleep.  This morning c/o feeling dizzy and generalized weakness.  Denies any cp today.  Reports took some antivert along with his usual medications this morning.  Says he thinks the antivert helped his dizziness a little.

## 2012-03-21 ENCOUNTER — Inpatient Hospital Stay (HOSPITAL_COMMUNITY)
Admission: EM | Admit: 2012-03-21 | Discharge: 2012-03-24 | DRG: 293 | Disposition: A | Discharge status: Home Health | Payer: Medicare Other | Attending: Internal Medicine | Admitting: Internal Medicine

## 2012-03-21 ENCOUNTER — Emergency Department (HOSPITAL_COMMUNITY): Payer: Medicare Other

## 2012-03-21 ENCOUNTER — Encounter (HOSPITAL_COMMUNITY): Payer: Self-pay | Admitting: Emergency Medicine

## 2012-03-21 DIAGNOSIS — I509 Heart failure, unspecified: Secondary | ICD-10-CM

## 2012-03-21 DIAGNOSIS — Z9089 Acquired absence of other organs: Secondary | ICD-10-CM

## 2012-03-21 DIAGNOSIS — T503X1A Poisoning by electrolytic, caloric and water-balance agents, accidental (unintentional), initial encounter: Secondary | ICD-10-CM | POA: Diagnosis not present

## 2012-03-21 DIAGNOSIS — I951 Orthostatic hypotension: Secondary | ICD-10-CM | POA: Diagnosis not present

## 2012-03-21 DIAGNOSIS — Z7902 Long term (current) use of antithrombotics/antiplatelets: Secondary | ICD-10-CM

## 2012-03-21 DIAGNOSIS — N400 Enlarged prostate without lower urinary tract symptoms: Secondary | ICD-10-CM | POA: Diagnosis present

## 2012-03-21 DIAGNOSIS — M1990 Unspecified osteoarthritis, unspecified site: Secondary | ICD-10-CM | POA: Diagnosis present

## 2012-03-21 DIAGNOSIS — Z9861 Coronary angioplasty status: Secondary | ICD-10-CM

## 2012-03-21 DIAGNOSIS — F068 Other specified mental disorders due to known physiological condition: Secondary | ICD-10-CM | POA: Diagnosis present

## 2012-03-21 DIAGNOSIS — I5021 Acute systolic (congestive) heart failure: Principal | ICD-10-CM | POA: Diagnosis present

## 2012-03-21 DIAGNOSIS — Z8673 Personal history of transient ischemic attack (TIA), and cerebral infarction without residual deficits: Secondary | ICD-10-CM

## 2012-03-21 DIAGNOSIS — Z23 Encounter for immunization: Secondary | ICD-10-CM

## 2012-03-21 DIAGNOSIS — E119 Type 2 diabetes mellitus without complications: Secondary | ICD-10-CM

## 2012-03-21 DIAGNOSIS — I2510 Atherosclerotic heart disease of native coronary artery without angina pectoris: Secondary | ICD-10-CM

## 2012-03-21 DIAGNOSIS — T502X1A Poisoning by carbonic-anhydrase inhibitors, benzothiadiazides and other diuretics, accidental (unintentional), initial encounter: Secondary | ICD-10-CM | POA: Diagnosis not present

## 2012-03-21 DIAGNOSIS — IMO0002 Reserved for concepts with insufficient information to code with codable children: Secondary | ICD-10-CM

## 2012-03-21 DIAGNOSIS — I214 Non-ST elevation (NSTEMI) myocardial infarction: Secondary | ICD-10-CM | POA: Diagnosis present

## 2012-03-21 DIAGNOSIS — E669 Obesity, unspecified: Secondary | ICD-10-CM | POA: Diagnosis present

## 2012-03-21 DIAGNOSIS — I10 Essential (primary) hypertension: Secondary | ICD-10-CM

## 2012-03-21 DIAGNOSIS — K219 Gastro-esophageal reflux disease without esophagitis: Secondary | ICD-10-CM | POA: Diagnosis present

## 2012-03-21 DIAGNOSIS — Z951 Presence of aortocoronary bypass graft: Secondary | ICD-10-CM

## 2012-03-21 DIAGNOSIS — Z87891 Personal history of nicotine dependence: Secondary | ICD-10-CM

## 2012-03-21 DIAGNOSIS — R42 Dizziness and giddiness: Secondary | ICD-10-CM | POA: Diagnosis present

## 2012-03-21 DIAGNOSIS — R0989 Other specified symptoms and signs involving the circulatory and respiratory systems: Secondary | ICD-10-CM

## 2012-03-21 DIAGNOSIS — R0602 Shortness of breath: Secondary | ICD-10-CM | POA: Insufficient documentation

## 2012-03-21 DIAGNOSIS — F0390 Unspecified dementia without behavioral disturbance: Secondary | ICD-10-CM | POA: Diagnosis present

## 2012-03-21 DIAGNOSIS — Z8711 Personal history of peptic ulcer disease: Secondary | ICD-10-CM

## 2012-03-21 DIAGNOSIS — Z7982 Long term (current) use of aspirin: Secondary | ICD-10-CM

## 2012-03-21 DIAGNOSIS — R0600 Dyspnea, unspecified: Secondary | ICD-10-CM | POA: Diagnosis present

## 2012-03-21 DIAGNOSIS — R9431 Abnormal electrocardiogram [ECG] [EKG]: Secondary | ICD-10-CM

## 2012-03-21 DIAGNOSIS — Z79899 Other long term (current) drug therapy: Secondary | ICD-10-CM

## 2012-03-21 DIAGNOSIS — E785 Hyperlipidemia, unspecified: Secondary | ICD-10-CM | POA: Diagnosis present

## 2012-03-21 DIAGNOSIS — E1165 Type 2 diabetes mellitus with hyperglycemia: Secondary | ICD-10-CM

## 2012-03-21 LAB — CBC WITH DIFFERENTIAL/PLATELET
Basophils Absolute: 0 10*3/uL (ref 0.0–0.1)
HCT: 35.4 % — ABNORMAL LOW (ref 39.0–52.0)
Lymphocytes Relative: 22 % (ref 12–46)
Neutro Abs: 3.6 10*3/uL (ref 1.7–7.7)
Platelets: 156 10*3/uL (ref 150–400)
RBC: 4.01 MIL/uL — ABNORMAL LOW (ref 4.22–5.81)
RDW: 13.5 % (ref 11.5–15.5)
WBC: 5.9 10*3/uL (ref 4.0–10.5)

## 2012-03-21 LAB — PRO B NATRIURETIC PEPTIDE: Pro B Natriuretic peptide (BNP): 1387 pg/mL — ABNORMAL HIGH (ref 0–450)

## 2012-03-21 LAB — URINALYSIS, ROUTINE W REFLEX MICROSCOPIC
Bilirubin Urine: NEGATIVE
Ketones, ur: NEGATIVE mg/dL
Nitrite: NEGATIVE
Protein, ur: NEGATIVE mg/dL
Urobilinogen, UA: 0.2 mg/dL (ref 0.0–1.0)

## 2012-03-21 LAB — GLUCOSE, CAPILLARY

## 2012-03-21 LAB — BASIC METABOLIC PANEL
Calcium: 9.6 mg/dL (ref 8.4–10.5)
Chloride: 100 mEq/L (ref 96–112)
Creatinine, Ser: 1.02 mg/dL (ref 0.50–1.35)
GFR calc Af Amer: 77 mL/min — ABNORMAL LOW (ref >90)
Sodium: 133 mEq/L — ABNORMAL LOW (ref 135–145)

## 2012-03-21 LAB — TROPONIN I: Troponin I: <0.3 ng/mL (ref <0.30)

## 2012-03-21 LAB — LACTIC ACID, PLASMA: Lactic Acid, Venous: 1.6 mmol/L (ref 0.5–2.2)

## 2012-03-21 MED ORDER — ACETAMINOPHEN 325 MG PO TABS
650.0000 mg | ORAL_TABLET | ORAL | Status: DC | PRN
  Administered 2012-03-22 – 2012-03-23 (×2): 650 mg via ORAL
  Filled 2012-03-21 (×2): qty 2

## 2012-03-21 MED ORDER — METFORMIN HCL 500 MG PO TABS
500.0000 mg | ORAL_TABLET | Freq: Two times a day (BID) | ORAL | Status: DC
  Administered 2012-03-22 – 2012-03-24 (×6): 500 mg via ORAL
  Filled 2012-03-21 (×6): qty 1

## 2012-03-21 MED ORDER — ONDANSETRON HCL 4 MG/2ML IJ SOLN: 4.0000 mg | INTRAMUSCULAR | Status: DC | PRN

## 2012-03-21 MED ORDER — INSULIN ASPART 100 UNIT/ML ~~LOC~~ SOLN
0.0000 [IU] | Freq: Three times a day (TID) | SUBCUTANEOUS | Status: DC
  Administered 2012-03-22: 2 [IU] via SUBCUTANEOUS
  Administered 2012-03-22 (×2): 1 [IU] via SUBCUTANEOUS
  Administered 2012-03-23: 2 [IU] via SUBCUTANEOUS
  Administered 2012-03-23: 1 [IU] via SUBCUTANEOUS
  Administered 2012-03-23 – 2012-03-24 (×3): 2 [IU] via SUBCUTANEOUS

## 2012-03-21 MED ORDER — ALBUTEROL SULFATE HFA 108 (90 BASE) MCG/ACT IN AERS: 1.0000 | INHALATION_SPRAY | Freq: Four times a day (QID) | RESPIRATORY_TRACT | Status: DC | PRN

## 2012-03-21 MED ORDER — SODIUM CHLORIDE 0.9 % IJ SOLN: 3.0000 mL | INTRAMUSCULAR | Status: DC | PRN

## 2012-03-21 MED ORDER — ISOSORBIDE MONONITRATE ER 60 MG PO TB24
30.0000 mg | ORAL_TABLET | Freq: Every day | ORAL | Status: DC
  Administered 2012-03-22 – 2012-03-24 (×3): 30 mg via ORAL
  Filled 2012-03-21 (×3): qty 1

## 2012-03-21 MED ORDER — DOCUSATE SODIUM 100 MG PO CAPS
100.0000 mg | ORAL_CAPSULE | Freq: Every day | ORAL | Status: DC | PRN
  Administered 2012-03-24: 100 mg via ORAL
  Filled 2012-03-21: qty 1

## 2012-03-21 MED ORDER — ATORVASTATIN CALCIUM 20 MG PO TABS
20.0000 mg | ORAL_TABLET | Freq: Every day | ORAL | Status: DC
  Administered 2012-03-22 – 2012-03-24 (×3): 20 mg via ORAL
  Filled 2012-03-21 (×3): qty 1

## 2012-03-21 MED ORDER — INSULIN ASPART 100 UNIT/ML ~~LOC~~ SOLN: 0.0000 [IU] | Freq: Every day | SUBCUTANEOUS | Status: DC

## 2012-03-21 MED ORDER — MECLIZINE HCL 12.5 MG PO TABS
25.0000 mg | ORAL_TABLET | Freq: Every day | ORAL | Status: DC
  Administered 2012-03-22 – 2012-03-24 (×3): 25 mg via ORAL
  Filled 2012-03-21: qty 2
  Filled 2012-03-21: qty 1
  Filled 2012-03-21: qty 2

## 2012-03-21 MED ORDER — SODIUM CHLORIDE 0.9 % IV SOLN
250.0000 mL | INTRAVENOUS | Status: DC | PRN
  Administered 2012-03-24: 250 mL via INTRAVENOUS

## 2012-03-21 MED ORDER — SODIUM CHLORIDE 0.9 % IJ SOLN
3.0000 mL | Freq: Two times a day (BID) | INTRAMUSCULAR | Status: DC
  Administered 2012-03-21 – 2012-03-24 (×5): 3 mL via INTRAVENOUS
  Filled 2012-03-21 (×5): qty 3

## 2012-03-21 MED ORDER — NITROGLYCERIN 0.4 MG SL SUBL: 0.4000 mg | SUBLINGUAL_TABLET | SUBLINGUAL | Status: DC | PRN

## 2012-03-21 MED ORDER — POTASSIUM CHLORIDE CRYS ER 20 MEQ PO TBCR
40.0000 meq | EXTENDED_RELEASE_TABLET | Freq: Two times a day (BID) | ORAL | Status: DC
  Administered 2012-03-21 – 2012-03-24 (×6): 40 meq via ORAL
  Filled 2012-03-21 (×6): qty 2

## 2012-03-21 MED ORDER — ASPIRIN 325 MG PO TABS
325.0000 mg | ORAL_TABLET | Freq: Every day | ORAL | Status: DC
  Administered 2012-03-22 – 2012-03-24 (×3): 325 mg via ORAL
  Filled 2012-03-21 (×3): qty 1

## 2012-03-21 MED ORDER — PANTOPRAZOLE SODIUM 40 MG PO TBEC
40.0000 mg | DELAYED_RELEASE_TABLET | Freq: Every day | ORAL | Status: DC
  Administered 2012-03-22 – 2012-03-24 (×3): 40 mg via ORAL
  Filled 2012-03-21 (×3): qty 1

## 2012-03-21 MED ORDER — TAMSULOSIN HCL 0.4 MG PO CAPS
0.4000 mg | ORAL_CAPSULE | Freq: Every day | ORAL | Status: DC
  Administered 2012-03-21 – 2012-03-23 (×3): 0.4 mg via ORAL
  Filled 2012-03-21 (×3): qty 1

## 2012-03-21 MED ORDER — TICAGRELOR 90 MG PO TABS
90.0000 mg | ORAL_TABLET | Freq: Two times a day (BID) | ORAL | Status: DC
  Administered 2012-03-22 – 2012-03-24 (×5): 90 mg via ORAL
  Filled 2012-03-21 (×10): qty 1

## 2012-03-21 MED ORDER — FUROSEMIDE 10 MG/ML IJ SOLN
40.0000 mg | Freq: Two times a day (BID) | INTRAMUSCULAR | Status: DC
  Administered 2012-03-21 – 2012-03-22 (×3): 40 mg via INTRAVENOUS
  Filled 2012-03-21 (×3): qty 4

## 2012-03-21 MED ORDER — ENOXAPARIN SODIUM 40 MG/0.4ML ~~LOC~~ SOLN
40.0000 mg | SUBCUTANEOUS | Status: DC
  Administered 2012-03-22 – 2012-03-24 (×3): 40 mg via SUBCUTANEOUS
  Filled 2012-03-21 (×3): qty 0.4

## 2012-03-21 MED ORDER — CARVEDILOL 3.125 MG PO TABS
6.2500 mg | ORAL_TABLET | Freq: Two times a day (BID) | ORAL | Status: DC
  Administered 2012-03-22 – 2012-03-24 (×6): 6.25 mg via ORAL
  Filled 2012-03-21 (×4): qty 2
  Filled 2012-03-21: qty 1
  Filled 2012-03-21 (×3): qty 2

## 2012-03-21 MED ORDER — RAMIPRIL 2.5 MG PO CAPS
5.0000 mg | ORAL_CAPSULE | Freq: Every day | ORAL | Status: DC
  Administered 2012-03-22 – 2012-03-24 (×3): 5 mg via ORAL
  Filled 2012-03-21 (×3): qty 2

## 2012-03-21 MED ORDER — SODIUM CHLORIDE 0.9 % IV SOLN
INTRAVENOUS | Status: DC
  Administered 2012-03-21: 1000 mL via INTRAVENOUS

## 2012-03-21 NOTE — ED Notes (Signed)
Dr Campbell in room assessing pt at this time.  

## 2012-03-21 NOTE — ED Notes (Signed)
No complaints from patient during orthostatic vital signs 

## 2012-03-21 NOTE — ED Notes (Signed)
Attempted to call report, was told they will call back

## 2012-03-21 NOTE — ED Provider Notes (Signed)
History     CSN: [id number]  Arrival date & time 03/21/12  1706   First MD Initiated Contact with Patient 03/21/12 1743      Chief Complaint  Patient presents with  . Shortness of Breath  . Weakness     HPI Pt was seen at 1825.  Per pt, c/o gradual onset and worsening of persistent SOB, especially on extertion for the past 4 days.  Has been associated with generalized weakness and fatigue.  Pt states he fell out of bed 4 days ago, before symptoms began.  States he "just hasn't felt right" since s/p cardiac cath with stent placement approx 1 week ago.  Pt was eval in the ED yesterday for same.  Denies CP/palpitations, no cough, no fevers, no back pain, no abd pain, no N/V/D, no syncope.       Past Medical History  Diagnosis Date  . Diabetes mellitus   . Hypercholesterolemia   . Coronary artery disease     a.  s/p CABG x 4 1989, VG->OM1->OM2, VG->RCA, LIMA->LAD;  b. 05/24/10 Cath/PCI:  VG->OM1->OM2 90 in graft (3.0x15 promus) & 90 in distal LCX (2.75x15 Promus);  c. NSTEMI in 04/2011 - Cath occlusion of distal LCX stent and VG->OM2.;  d. 02/2012 NSTEMI Cath/PCI: 3VD, LIMA->LAD ok, VG->RCA 100, VG->OM1 99p (2.75x20mm Resolute DES), continuation to OM2 occluded.  . Diabetes mellitus   . Hypertension   . PUD (peptic ulcer disease)   . Hypercholesteremia   . GERD (gastroesophageal reflux disease)   . Arthritis   . Osteoarthritis   . Stroke   . History of pneumonia   . Neck fracture   . Back pain     Past Surgical History  Procedure Date  . Coronary angioplasty with stent placement   . Tonsillectomy   . Post aortocoronary bypass surgery   . Coronary artery bypass graft   . Coronary stent placement   . Coronary angioplasty     History  Substance Use Topics  . Smoking status: Former Smoker    Types: Cigarettes  . Smokeless tobacco: Current User    Types: Chew  . Alcohol Use: No      Review of Systems ROS: Statement: All systems negative except as marked or noted in  the HPI; Constitutional: Negative for fever and chills. ; ; Eyes: Negative for eye pain, redness and discharge. ; ; ENMT: Negative for ear pain, hoarseness, nasal congestion, sinus pressure and sore throat. ; ; Cardiovascular: +dyspnea, esp DOE. Negative for chest pain, palpitations, diaphoresis, and peripheral edema. ; ; Respiratory: Negative for cough, wheezing and stridor. ; ; Gastrointestinal: Negative for nausea, vomiting, diarrhea, abdominal pain, blood in stool, hematemesis, jaundice and rectal bleeding. . ; ; Genitourinary: Negative for dysuria, flank pain and hematuria. ; ; Musculoskeletal: Negative for back pain and neck pain. Negative for swelling and trauma.; ; Skin: Negative for pruritus, rash, abrasions, blisters, bruising and skin lesion.; ; Neuro: +lightheaded, generalized weakness. Negative for headache and neck stiffness. Negative for altered level of consciousness , altered mental status, extremity weakness, paresthesias, involuntary movement, seizure and syncope.     Allergies  Review of patient's allergies indicates no known allergies.  Home Medications   Current Outpatient Rx  Name Route Sig Dispense Refill  . ALBUTEROL SULFATE HFA 108 (90 BASE) MCG/ACT IN AERS Inhalation Inhale 1-2 puffs into the lungs every 6 (six) hours as needed. 1 Inhaler 6  . ASPIRIN 325 MG PO TABS Oral Take 325 mg by mouth daily. For  pain    . B-COMPLEX PO Oral Take 1 tablet by mouth daily.     Marland Kitchen PEPTO-BISMOL PO Oral Take by mouth daily as needed. Patient states that he just takes a couple of swallows when needed to control bowels.    Marland Kitchen CALCIUM CARBONATE ANTACID 500 MG PO CHEW Oral Chew 1 tablet by mouth 4 (four) times daily - after meals and at bedtime.    Marland Kitchen CARVEDILOL 6.25 MG PO TABS Oral Take 6.25 mg by mouth 2 (two) times daily.    . CRESTOR 10 MG PO TABS  TAKE ONE TABLET BY MOUTH DAILY FOR CHOLESTEROL. 30 each 11  . DOCUSATE SODIUM 100 MG PO TABS Oral Take 100 mg by mouth daily as needed.  Constipation    . ISOSORBIDE MONONITRATE ER 30 MG PO TB24  TAKE 1 TABLET BY MOUTH DAILY FOR HEART. 30 tablet 12  . MECLIZINE HCL 25 MG PO TABS Oral Take 25 mg by mouth daily. For dizziness    . METFORMIN HCL 500 MG PO TABS Oral Take 1 tablet (500 mg total) by mouth 2 (two) times daily. **RESUME ON 03/13/2012**    . ADULT MULTIVITAMIN W/MINERALS CH Oral Take 1 tablet by mouth daily.    Marland Kitchen PANTOPRAZOLE SODIUM 40 MG PO TBEC Oral Take 40 mg by mouth every morning.    Marland Kitchen RAMIPRIL 5 MG PO CAPS Oral Take 5 mg by mouth daily. For blood pressure    . SAXAGLIPTIN HCL 5 MG PO TABS Oral Take 5 mg by mouth daily at 12 noon.     Marland Kitchen TAMSULOSIN HCL 0.4 MG PO CAPS Oral Take 0.4 mg by mouth at bedtime. For urinary health    . TICAGRELOR 90 MG PO TABS Oral Take 1 tablet (90 mg total) by mouth 2 (two) times daily. 60 tablet 6  . NITROGLYCERIN 0.4 MG SL SUBL Sublingual Place 1 tablet (0.4 mg total) under the tongue every 5 (five) minutes x 3 doses as needed for chest pain. 25 tablet 3    BP 144/75  Pulse 70  Temp 98.2 F (36.8 C) (Oral)  Resp 16  Ht 5\' 10"  (1.778 m)  Wt 180 lb (81.647 kg)  BMI 25.83 kg/m2  SpO2 99%  Physical Exam 1830: Physical examination:  Nursing notes reviewed; Vital signs and O2 SAT reviewed;  Constitutional: Well developed, Well nourished, Well hydrated, In no acute distress; Head:  Normocephalic, atraumatic; Eyes: EOMI, PERRL, No scleral icterus; ENMT: Mouth and pharynx normal, Mucous membranes moist; Neck: Supple, Full range of motion, No lymphadenopathy; Cardiovascular: Regular rate and rhythm, No gallop; Respiratory: Breath sounds clear & equal bilaterally, No wheezes.  Speaking full sentences with ease, Normal respiratory effort/excursion; Chest: Nontender, Movement normal; Abdomen: Soft, Nontender, Nondistended, Normal bowel sounds; Genitourinary: No CVA tenderness; Extremities: Pulses normal, No tenderness, No edema, No calf edema or asymmetry.; Neuro: AA&Ox3, Major CN grossly intact.   Speech clear. No facial droop. No gross focal motor or sensory deficits in extremities.; Skin: Color normal, Warm, Dry.; Psych:  Anxious.   ED Course  Procedures    MDM  MDM Reviewed: previous chart, vitals and nursing note Reviewed previous: labs and ECG Interpretation: labs, ECG, x-ray and CT scan      Date: 03/21/2012  Rate: 76  Rhythm: normal sinus rhythm  QRS Axis: normal  Intervals: normal  ST/T Wave abnormalities: nonspecific T wave changes, TWI leads V4-V6, II, III, aVL  Conduction Disutrbances:none  Narrative Interpretation:   Old EKG Reviewed: changes noted; new  TWI leads V4-V5 compared to previous EKG dated 03/20/3012.  Results for orders placed during the hospital encounter of 03/21/12  BASIC METABOLIC PANEL      Component Value Range   Sodium 133 (*) 135 - 145 mEq/L   Potassium 4.0  3.5 - 5.1 mEq/L   Chloride 100  96 - 112 mEq/L   CO2 25  19 - 32 mEq/L   Glucose, Bld 223 (*) 70 - 99 mg/dL   BUN 12  6 - 23 mg/dL   Creatinine, Ser 1.61  0.50 - 1.35 mg/dL   Calcium 9.6  8.4 - 09.6 mg/dL   GFR calc non Af Amer 67 (*) >90 mL/min   GFR calc Af Amer 77 (*) >90 mL/min  CBC WITH DIFFERENTIAL      Component Value Range   WBC 5.9  4.0 - 10.5 K/uL   RBC 4.01 (*) 4.22 - 5.81 MIL/uL   Hemoglobin 11.8 (*) 13.0 - 17.0 g/dL   HCT 04.5 (*) 40.9 - 81.1 %   MCV 88.3  78.0 - 100.0 fL   MCH 29.4  26.0 - 34.0 pg   MCHC 33.3  30.0 - 36.0 g/dL   RDW 91.4  78.2 - 95.6 %   Platelets 156  150 - 400 K/uL   Neutrophils Relative 60  43 - 77 %   Neutro Abs 3.6  1.7 - 7.7 K/uL   Lymphocytes Relative 22  12 - 46 %   Lymphs Abs 1.3  0.7 - 4.0 K/uL   Monocytes Relative 12  3 - 12 %   Monocytes Absolute 0.7  0.1 - 1.0 K/uL   Eosinophils Relative 5  0 - 5 %   Eosinophils Absolute 0.3  0.0 - 0.7 K/uL   Basophils Relative 1  0 - 1 %   Basophils Absolute 0.0  0.0 - 0.1 K/uL  LACTIC ACID, PLASMA      Component Value Range   Lactic Acid, Venous 1.6  0.5 - 2.2 mmol/L  PROCALCITONIN       Component Value Range   Procalcitonin <0.10    PRO B NATRIURETIC PEPTIDE      Component Value Range   Pro B Natriuretic peptide (BNP) 1387.0 (*) 0 - 450 pg/mL  TROPONIN I      Component Value Range   Troponin I <0.30  <0.30 ng/mL  URINALYSIS, ROUTINE W REFLEX MICROSCOPIC      Component Value Range   Color, Urine YELLOW  YELLOW   APPearance CLEAR  CLEAR   Specific Gravity, Urine 1.010  1.005 - 1.030   pH 7.0  5.0 - 8.0   Glucose, UA 100 (*) NEGATIVE mg/dL   Hgb urine dipstick NEGATIVE  NEGATIVE   Bilirubin Urine NEGATIVE  NEGATIVE   Ketones, ur NEGATIVE  NEGATIVE mg/dL   Protein, ur NEGATIVE  NEGATIVE mg/dL   Urobilinogen, UA 0.2  0.0 - 1.0 mg/dL   Nitrite NEGATIVE  NEGATIVE   Leukocytes, UA NEGATIVE  NEGATIVE   Dg Chest 2 View 03/21/2012  *RADIOLOGY REPORT*  Clinical Data: Shortness of breath.  CHEST - 2 VIEW  Comparison: 03/20/2012.  Findings: The cardiac silhouette, mediastinal and hilar contours are within normal limits and stable.  Stable surgical changes related to bypass surgery.  The lungs are clear.  No pleural effusion.  The bony thorax is intact. Stable lower thoracic compression deformities.  IMPRESSION: No acute cardiopulmonary findings.   Original Report Authenticated By: P. Loralie Champagne, M.D.     Ct  Head Wo Contrast 03/21/2012  *RADIOLOGY REPORT*  Clinical Data: Altered mental status.  Weakness.  CT HEAD WITHOUT CONTRAST  Technique:  Contiguous axial images were obtained from the base of the skull through the vertex without contrast.  Comparison: 01/23/2012.  Findings: Stable enlarged ventricles and subarachnoid spaces.  No significant change in mild patchy white matter low density in both cerebral hemispheres.  No intracranial hemorrhage, mass lesion or CT evidence of acute infarction.  Unremarkable bones and included paranasal sinuses.  A small amount of air is noted in the left vertebral artery on the first image.  IMPRESSION:  1.  Small amount of air in the left vertebral  artery.  This could be due to air introduced at recent intravenous access if the patient has a right to left cardiac shunt. 2.  Stable atrophy and mild chronic small vessel white matter ischemic changes.   Original Report Authenticated By: Darrol Angel, M.D.        2020:  Pt orthostatic, with SBP dropping to 120 from 160 when moving from laying to standing. Pt ambulated in the ED very slowly, c/o feeling "weak" and "wobbling;" Sats remained 99-100% R/A.  No focal neuro deficits on exam.  Denies CP or SOB while at rest.  No CP on exertion.  Dx testing d/w pt.  Questions answered.  Verb understanding, agreeable to admit.  T/C to [facility]. Orvan Falconer, case discussed, including:  HPI, pertinent PM/SHx, VS/PE, dx testing, ED course and treatment:  Agreeable to observation admit, requests to obtain tele bed.            Laray Anger, DO 03/22/12 1125

## 2012-03-21 NOTE — ED Notes (Signed)
Talked with Court Joy - Daughter [phone] about pt condition. Pt stated It was OK to share information with her.  Told her we would call her back when we found out if pt would be admitted or not. Pt consented

## 2012-03-21 NOTE — ED Notes (Signed)
Patient brought in via EMS. Patient alert and oriented. Airway patent. Patient c/o shortness of breath and weakness after falling on Wednesday. Per patient had stent placed last week was discharged and felt fine until he fell out of bed onto concrete floor. Patient seen here yesterday for same reason.

## 2012-03-21 NOTE — ED Notes (Signed)
Went in to assess pt. Pt using urinal at this time. Will reassess pt shortly

## 2012-03-21 NOTE — ED Notes (Signed)
Patient complained of being "weak" during ambulation. Patients oxygen saturation maintained 99%-100% during ambulation. Patient also complained of "wobboling" while ambulation. Patient had a slow semi-steady gait.

## 2012-03-21 NOTE — ED Notes (Addendum)
Pt resting in bed at this time. States he does not have any chest pain at this time, also denies SOB at current moment. States weakness and dizziness with ambulation after a fall that occurred on Wednesday. Denies LOC and states he does not believe he hit his head. Pt is AAx4 at this time and all neuro checks WDL. Also notes having heart cath last week

## 2012-03-21 NOTE — H&P (Signed)
[facility] History and Physical  Jeffrey [name]  NFA:[id number]  DOB: 07/28/1928   DOA: 03/21/2012   PCP:   [name], MD   Chief Complaint:  Extreme dyspnea on exertion  HPI: Jeffrey [name] is an 78 y.o. male.   He obese Caucasian gentleman with mild dementia who lives alone and has been seen for 3 consecutive days for the same complaints of shortness of breath on exertion. Reports he fell out of bed 3 nights ago and has been having difficulty breathing since then.   In fact the patient had a recent NSTEMI, and had cardiac cath and DES on 03/10/2012. And did not have any difficulty breathing in the days immediately after his discharge from hospital.   He followed up with cardiology 03/19/2012, and complained of shortness of breath of one days duration but was considered sufficiently stable, and more concern was placed on the fact that he lives alone with mild dementia and seemed not fully able to care for himself. Patient concurred with this plan assessment at that time, but later on during the visit she apparently forgot he was discussing that and denied asking about assisted living facility. Patient has in fact shown an interest in an assisted living facility in past admissions to this hospital, as he understands it difficult for him to continue living alone, and tends to come to the emergency room for the onset of every symptom.  The patient presented to the emergency room again yesterday 03/20/2012, again for shortness of breath and according to the records chest pain, as was assessed as being stable and discharged home.  Patient returns again today, and complains of shortness of breath even walking to the bathroom, he is to try to stand up and has to use the toilet sitting down. He denies any memory of chest pain yesterday or over the past days.  Of note his BNP is elevated to over 1300, and his recent cardiac catheterization, after NSTEMI, revealed an ejection fraction of  35-40%; 5 months ago in April his ejection fraction was 55%.  Denies fever cough or cold denies nausea or vomiting denies frequency or nice black or bloody stool  Rewiew of Systems:   All systems negative except as marked bold or noted in the HPI;  Constitutional: Negative for malaise, fever and chills. ;  Eyes: Negative for eye pain, redness and discharge. ;  ENMT: Negative for ear pain, hoarseness, nasal congestion, sinus pressure and sore throat. ;  Cardiovascular: Negative for chest pain, palpitations, diaphoresis, and peripheral edema. ;  Respiratory: Negative for cough, hemoptysis, wheezing and stridor. ;  Gastrointestinal: Negative for nausea, vomiting, diarrhea, constipation, abdominal pain, melena, blood in stool, hematemesis, jaundice and rectal bleeding. unusual weight loss..   Genitourinary: Negative for frequency, dysuria, incontinence,flank pain and hematuria; Musculoskeletal: Negative for back pain and neck pain. Negative for swelling and trauma.;  Skin: . Negative for pruritus, rash, abrasions, bruising and skin lesion.; ulcerations Neuro: Negative for headache, lightheadedness and neck stiffness. Negative for weakness, altered level of consciousness , altered mental status, extremity weakness, burning feet, involuntary movement, seizure and syncope.  Psych: negative for anxiety, depression, insomnia, tearfulness, panic attacks, hallucinations, paranoia, suicidal or homicidal ideation    Past Medical History  Diagnosis Date  . Diabetes mellitus   . Hypercholesterolemia   . Coronary artery disease     a.  s/p CABG x 4 1989, VG->OM1->OM2, VG->RCA, LIMA->LAD;  b. 05/24/10 Cath/PCI:  VG->OM1->OM2 90 in graft (3.0x15 promus) & 90  in distal LCX (2.75x15 Promus);  c. NSTEMI in 04/2011 - Cath occlusion of distal LCX stent and VG->OM2.;  d. 02/2012 NSTEMI Cath/PCI: 3VD, LIMA->LAD ok, VG->RCA 100, VG->OM1 99p (2.75x44mm Resolute DES), continuation to OM2 occluded.  . Diabetes mellitus    . Hypertension   . PUD (peptic ulcer disease)   . Hypercholesteremia   . GERD (gastroesophageal reflux disease)   . Arthritis   . Osteoarthritis   . Stroke   . History of pneumonia   . Neck fracture   . Back pain     Past Surgical History  Procedure Date  . Coronary angioplasty with stent placement   . Tonsillectomy   . Post aortocoronary bypass surgery   . Coronary artery bypass graft   . Coronary stent placement   . Coronary angioplasty     Medications:  HOME MEDS: Prior to Admission medications   Medication Sig Start Date End Date Taking? Authorizing Provider  albuterol (PROAIR HFA) 108 (90 BASE) MCG/ACT inhaler Inhale 1-2 puffs into the lungs every 6 (six) hours as needed. 01/29/12  Yes Jodelle Gross, NP  aspirin 325 MG tablet Take 325 mg by mouth daily. For pain   Yes Historical Provider, MD  B Complex-Biotin-FA (B-COMPLEX PO) Take 1 tablet by mouth daily.    Yes Historical Provider, MD  Bismuth Subsalicylate (PEPTO-BISMOL PO) Take by mouth daily as needed. Patient states that he just takes a couple of swallows when needed to control bowels.   Yes Historical Provider, MD  calcium carbonate (TUMS - DOSED IN MG ELEMENTAL CALCIUM) 500 MG chewable tablet Chew 1 tablet by mouth 4 (four) times daily - after meals and at bedtime.   Yes Historical Provider, MD  carvedilol (COREG) 6.25 MG tablet Take 6.25 mg by mouth 2 (two) times daily.   Yes Historical Provider, MD  CRESTOR 10 MG tablet TAKE ONE TABLET BY MOUTH DAILY FOR CHOLESTEROL. 03/18/12  Yes Jodelle Gross, NP  Docusate Sodium (STOOL SOFTENER) 100 MG capsule Take 100 mg by mouth daily as needed. Constipation   Yes Historical Provider, MD  isosorbide mononitrate (IMDUR) 30 MG 24 hr tablet TAKE 1 TABLET BY MOUTH DAILY FOR HEART. 02/19/12  Yes Jodelle Gross, NP  meclizine (ANTIVERT) 25 MG tablet Take 25 mg by mouth daily. For dizziness   Yes Historical Provider, MD  metFORMIN (GLUCOPHAGE) 500 MG tablet Take 1 tablet  (500 mg total) by mouth 2 (two) times daily. **RESUME ON 03/13/2012** 03/12/12  Yes Ok Anis, NP  Multiple Vitamin (MULTIVITAMIN WITH MINERALS) TABS Take 1 tablet by mouth daily.   Yes Historical Provider, MD  pantoprazole (PROTONIX) 40 MG tablet Take 40 mg by mouth every morning. 12/17/11  Yes Christiane Ha, MD  ramipril (ALTACE) 5 MG capsule Take 5 mg by mouth daily. For blood pressure   Yes Historical Provider, MD  saxagliptin HCl (ONGLYZA) 5 MG TABS tablet Take 5 mg by mouth daily at 12 noon.    Yes Historical Provider, MD  Tamsulosin HCl (FLOMAX) 0.4 MG CAPS Take 0.4 mg by mouth at bedtime. For urinary health   Yes Historical Provider, MD  Ticagrelor (BRILINTA) 90 MG TABS tablet Take 1 tablet (90 mg total) by mouth 2 (two) times daily. 03/12/12  Yes Ok Anis, NP  nitroGLYCERIN (NITROSTAT) 0.4 MG SL tablet Place 1 tablet (0.4 mg total) under the tongue every 5 (five) minutes x 3 doses as needed for chest pain. 03/12/12 03/12/13  Ok Anis, NP  Allergies:  No Known Allergies  Social History:   reports that he has quit smoking. His smoking use included Cigarettes. His smokeless tobacco use includes Chew. He reports that he does not drink alcohol or use illicit drugs.  Family History: History reviewed. No pertinent family history.   Physical Exam: Filed Vitals:   03/21/12 1835 03/21/12 1837 03/21/12 1839 03/21/12 1945  BP: 160/71 155/70 120/62 144/75  Pulse: 74 76 80 70  Temp:      TempSrc:      Resp:    16  Height:      Weight:      SpO2:    99%   Blood pressure 144/75, pulse 70, temperature 98.2 F (36.8 C), temperature source Oral, resp. rate 16, height 5\' 10"  (1.778 m), weight 81.647 kg (180 lb), SpO2 99.00%.  GEN:  Pleasant elderly Caucasian gentleman reclining in the stretcher in no acute distress; cooperative with exam PSYCH:  alert and oriented x3; admit to having problems with his memory of time and dates; does not appear anxious or  depressed; in fact does not appear to concerned about any medical problem at this time, despite his complaints. HEENT: Mucous membranes pink, dry, and anicteric; PERRLA; EOM intact; no cervical lymphadenopathy nor thyromegaly or carotid bruit; no JVD; Breasts:: Not examined CHEST WALL: No tenderness CHEST: Normal respiration, clear to auscultation bilaterally HEART: Regular rate and rhythm; no murmurs rubs or gallops BACK: No kyphosis or scoliosis; no CVA tenderness ABDOMEN: Obese, soft non-tender; no masses, no organomegaly, normal abdominal bowel sounds; no pannus; no intertriginous candida. Rectal Exam: Not done EXTREMITIES: ; age-appropriate arthropathy of the hands and knees; no edema; no ulcerations. Genitalia: not examined PULSES: 2+ and symmetric SKIN: Normal hydration no rash or ulceration CNS: Cranial nerves 2-12 grossly intact no focal lateralizing neurologic deficit   Labs on Admission:  Basic Metabolic Panel:  Lab 03/21/12 1610 03/20/12 0910  NA 133* 133*  K 4.0 4.2  CL 100 95*  CO2 25 24  GLUCOSE 223* 199*  BUN 12 12  CREATININE 1.02 0.93  CALCIUM 9.6 9.9  MG -- --  PHOS -- --   Liver Function Tests: No results found for this basename: AST:5,ALT:5,ALKPHOS:5,BILITOT:5,PROT:5,ALBUMIN:5 in the last 168 hours No results found for this basename: LIPASE:5,AMYLASE:5 in the last 168 hours No results found for this basename: AMMONIA:5 in the last 168 hours CBC:  Lab 03/21/12 1821 03/20/12 0910  WBC 5.9 10.2  NEUTROABS 3.6 8.1*  HGB 11.8* 13.0  HCT 35.4* 39.0  MCV 88.3 89.0  PLT 156 172   Cardiac Enzymes:  Lab 03/21/12 1821 03/20/12 1216 03/20/12 0910  CKTOTAL -- -- --  CKMB -- -- --  CKMBINDEX -- -- --  TROPONINI <0.30 <0.30 <0.30   BNP: No components found with this basename: POCBNP:5 D-dimer: No components found with this basename: D-DIMER:5 CBG:  Lab 03/20/12 0943  GLUCAP 144*    Results for orders placed during the hospital encounter of 03/21/12  (from the past 48 hour(s))  BASIC METABOLIC PANEL     Status: Abnormal   Collection Time   03/21/12  6:21 PM      Component Value Range Comment   Sodium 133 (*) 135 - 145 mEq/L    Potassium 4.0  3.5 - 5.1 mEq/L    Chloride 100  96 - 112 mEq/L    CO2 25  19 - 32 mEq/L    Glucose, Bld 223 (*) 70 - 99 mg/dL    BUN 12  6 - 23 mg/dL    Creatinine, Ser 1.61  0.50 - 1.35 mg/dL    Calcium 9.6  8.4 - 09.6 mg/dL    GFR calc non Af Amer 67 (*) >90 mL/min    GFR calc Af Amer 77 (*) >90 mL/min   CBC WITH DIFFERENTIAL     Status: Abnormal   Collection Time   03/21/12  6:21 PM      Component Value Range Comment   WBC 5.9  4.0 - 10.5 K/uL    RBC 4.01 (*) 4.22 - 5.81 MIL/uL    Hemoglobin 11.8 (*) 13.0 - 17.0 g/dL    HCT 04.5 (*) 40.9 - 52.0 %    MCV 88.3  78.0 - 100.0 fL    MCH 29.4  26.0 - 34.0 pg    MCHC 33.3  30.0 - 36.0 g/dL    RDW 81.1  91.4 - 78.2 %    Platelets 156  150 - 400 K/uL    Neutrophils Relative 60  43 - 77 %    Neutro Abs 3.6  1.7 - 7.7 K/uL    Lymphocytes Relative 22  12 - 46 %    Lymphs Abs 1.3  0.7 - 4.0 K/uL    Monocytes Relative 12  3 - 12 %    Monocytes Absolute 0.7  0.1 - 1.0 K/uL    Eosinophils Relative 5  0 - 5 %    Eosinophils Absolute 0.3  0.0 - 0.7 K/uL    Basophils Relative 1  0 - 1 %    Basophils Absolute 0.0  0.0 - 0.1 K/uL   LACTIC ACID, PLASMA     Status: Normal   Collection Time   03/21/12  6:21 PM      Component Value Range Comment   Lactic Acid, Venous 1.6  0.5 - 2.2 mmol/L   PRO B NATRIURETIC PEPTIDE     Status: Abnormal   Collection Time   03/21/12  6:21 PM      Component Value Range Comment   Pro B Natriuretic peptide (BNP) 1387.0 (*) 0 - 450 pg/mL   TROPONIN I     Status: Normal   Collection Time   03/21/12  6:21 PM      Component Value Range Comment   Troponin I <0.30  <0.30 ng/mL   PROCALCITONIN     Status: Normal   Collection Time   03/21/12  6:22 PM      Component Value Range Comment   Procalcitonin <0.10     URINALYSIS, ROUTINE W REFLEX  MICROSCOPIC     Status: Abnormal   Collection Time   03/21/12  6:35 PM      Component Value Range Comment   Color, Urine YELLOW  YELLOW    APPearance CLEAR  CLEAR    Specific Gravity, Urine 1.010  1.005 - 1.030    pH 7.0  5.0 - 8.0    Glucose, UA 100 (*) NEGATIVE mg/dL    Hgb urine dipstick NEGATIVE  NEGATIVE    Bilirubin Urine NEGATIVE  NEGATIVE    Ketones, ur NEGATIVE  NEGATIVE mg/dL    Protein, ur NEGATIVE  NEGATIVE mg/dL    Urobilinogen, UA 0.2  0.0 - 1.0 mg/dL    Nitrite NEGATIVE  NEGATIVE    Leukocytes, UA NEGATIVE  NEGATIVE MICROSCOPIC NOT DONE ON URINES WITH NEGATIVE PROTEIN, BLOOD, LEUKOCYTES, NITRITE, OR GLUCOSE <1000 mg/dL.     Radiological Exams on Admission: Dg Chest 2 View  03/21/2012  *  RADIOLOGY REPORT*  Clinical Data: Shortness of breath.  CHEST - 2 VIEW  Comparison: 03/20/2012.  Findings: The cardiac silhouette, mediastinal and hilar contours are within normal limits and stable.  Stable surgical changes related to bypass surgery.  The lungs are clear.  No pleural effusion.  The bony thorax is intact. Stable lower thoracic compression deformities.  IMPRESSION: No acute cardiopulmonary findings.   Original Report Authenticated By: P. Loralie Champagne, M.D.    Ct Head Wo Contrast  03/21/2012  *RADIOLOGY REPORT*  Clinical Data: Altered mental status.  Weakness.  CT HEAD WITHOUT CONTRAST  Technique:  Contiguous axial images were obtained from the base of the skull through the vertex without contrast.  Comparison: 01/23/2012.  Findings: Stable enlarged ventricles and subarachnoid spaces.  No significant change in mild patchy white matter low density in both cerebral hemispheres.  No intracranial hemorrhage, mass lesion or CT evidence of acute infarction.  Unremarkable bones and included paranasal sinuses.  A small amount of air is noted in the left vertebral artery on the first image.  IMPRESSION:  1.  Small amount of air in the left vertebral artery.  This could be due to air introduced  at recent intravenous access if the patient has a right to left cardiac shunt. 2.  Stable atrophy and mild chronic small vessel white matter ischemic changes.   Original Report Authenticated By: Darrol Angel, M.D.    Dg Chest Port 1 View  03/20/2012  *RADIOLOGY REPORT*  Clinical Data: Weakness and chest pain  PORTABLE CHEST - 1 VIEW  Comparison: Chest radiograph 03/09/2012 and 01/23/2012  Findings: There are changes of median sternotomy for CABG. Cardiac, mediastinal, and hilar contours are stable.  Minimal linear atelectasis in the left lung base.  The inferior costophrenic angles are excluded from the image.  No visible pleural effusion or airspace disease.  IMPRESSION: Prior CABG.  No acute cardiopulmonary disease   Original Report Authenticated By: Britta Mccreedy, M.D.     EKG: Independently reviewed. Normal sinus rhythm with T-wave inversions throughout; the T-wave inversions in V5 and V6 appear to be new.   Assessment/Plan Present on Admission:   .Dyspnea .Acute systolic HF (heart failure)  Include this gentleman is post MI in. Ejection, and is now elevated BNP and symptomatic complaints of dyspnea on exertion, will treat as acute exacerbation of systolic heart failure, and will add Lasix to his ACE inhibitor and beta blocker. And dietary restriction. Follow his progress in hospital. [facility] consult his cardiologist for assistance with management, but he does have heart failure it is unlikely that he'll be managed successfully as an outpatient while he continues to live alone, and we can expect to see repeated frequent visits to the hospital unless his living situation changes  .DM .GERD .HTN (hypertension) .BPH (benign prostatic hyperplasia) .Hyperlipidemia .Dementia .Postural dizziness  - will continue empiric management of all his chronic medical problem   Other plans as per orders.  Code Status: FULL CODE Family Communication:  no family available at present  Disposition Plan:  hopefully will be able to discharge to an assisted living facility, when cleared by cardiology   Jeffrey [name] Nocturnist [facility] Pager [phone]   03/21/2012, 10:05 PM

## 2012-03-21 NOTE — ED Notes (Signed)
Patient c/o "slight" left shoulder pain. EDP made aware.

## 2012-03-22 DIAGNOSIS — I517 Cardiomegaly: Secondary | ICD-10-CM

## 2012-03-22 LAB — BASIC METABOLIC PANEL
CO2: 28 mEq/L (ref 19–32)
Calcium: 9.6 mg/dL (ref 8.4–10.5)
Chloride: 98 mEq/L (ref 96–112)
Creatinine, Ser: 0.93 mg/dL (ref 0.50–1.35)
Glucose, Bld: 176 mg/dL — ABNORMAL HIGH (ref 70–99)

## 2012-03-22 LAB — GLUCOSE, CAPILLARY
Glucose-Capillary: 148 mg/dL — ABNORMAL HIGH (ref 70–99)
Glucose-Capillary: 167 mg/dL — ABNORMAL HIGH (ref 70–99)
Glucose-Capillary: 160 mg/dL — ABNORMAL HIGH (ref 70–99)

## 2012-03-22 LAB — TROPONIN I: Troponin I: <0.3 ng/mL (ref <0.30)

## 2012-03-22 NOTE — Evaluation (Signed)
Physical Therapy Evaluation Patient Details Name: KOA ZOELLER MRN: [id number] DOB: [birth year] Today's Date: 03/22/2012 Time: 3086-5784 PT Time Calculation (min): 25 min  PT Assessment / Plan / Recommendation Clinical Impression  Pt is at prior functional level with strength and balance WNL.  He is very concerned about the possibility of having to go home today...he says that he doesn't feel up to it.  We discussed the possibility of him going to [facility].  He states that when he is ill, he would like to go there, but when he feels better (like now), he doesn't.    PT Assessment  Patent does not need any further PT services    Follow Up Recommendations       Barriers to Discharge Decreased caregiver support Lives alone, unhappily    Equipment Recommendations  None recommended by PT    Recommendations for Other Services     Frequency      Precautions / Restrictions Precautions Precautions: None Restrictions Weight Bearing Restrictions: No   Pertinent Vitals/Pain       Mobility  Bed Mobility Supine to Sit: 7: Independent Sit to Supine: 7: Independent Transfers Sit to Stand: 7: Independent Stand to Sit: 7: Independent Stand Pivot Transfers: 7: Independent Ambulation/Gait Ambulation/Gait Assistance: 7: Independent Ambulation Distance (Feet): 150 Feet Assistive device: None Ambulation/Gait Assistance Details: no observable problems with gait Gait Pattern: Within Functional Limits Gait velocity: WNL Stairs: No Wheelchair Mobility Wheelchair Mobility: No    Exercises     PT Diagnosis:    PT Problem List:   PT Treatment Interventions:     PT Goals    Visit Information  Last PT Received On: 03/22/12    Subjective Data  Subjective: I don't think I could go home today.Marland KitchenMarland KitchenI'm too tired Patient Stated Goal: none stated   Prior Functioning  Home Living Lives With: Alone Communication Communication: No difficulties    Cognition  Overall Cognitive Status:  Appears within functional limits for tasks assessed/performed Arousal/Alertness: Awake/alert Orientation Level: Appears intact for tasks assessed Behavior During Session: [facility] for tasks performed    Extremity/Trunk Assessment Right Upper Extremity Assessment RUE ROM/Strength/Tone: Within functional levels Left Upper Extremity Assessment LUE ROM/Strength/Tone: Within functional levels Right Lower Extremity Assessment RLE ROM/Strength/Tone: Within functional levels Left Lower Extremity Assessment LLE ROM/Strength/Tone: Within functional levels Trunk Assessment Trunk Assessment: Normal   Balance Balance Balance Assessed: Yes High Level Balance High Level Balance Activites: Side stepping;Backward walking;Direction changes;Turns;Head turns;Sudden stops High Level Balance Comments: No LOB with any of above  End of Session PT - End of Session Equipment Utilized During Treatment: Gait belt Activity Tolerance: Patient tolerated treatment well Patient left: in bed;with call bell/phone within reach;with bed alarm set Nurse Communication: Mobility status  GP Functional Assessment Tool Used: clinical judgement Functional Limitation: Mobility: Walking and moving around Mobility: Walking and Moving Around Current Status (O9629): 0 percent impaired, limited or restricted Mobility: Walking and Moving Around Goal Status (B2841): 0 percent impaired, limited or restricted Mobility: Walking and Moving Around Discharge Status [phone]): 0 percent impaired, limited or restricted   Myrlene Broker L 03/22/2012, 9:11 AM

## 2012-03-22 NOTE — Progress Notes (Signed)
Occupational Therapy Screen  OT orders received and patient chart reviewed. Reviewed PT eval stating that patient was performing at baseline. Spoke with patient to assess his need for OT services. Patient does not feel that he needs OT services at this time. Will sign off.  Limmie Patricia, OTR/L 03/22/12 15:08

## 2012-03-22 NOTE — Progress Notes (Signed)
[facility] PROGRESS NOTE  [name]:[id number] DOB: 1929-04-29 DOA: 03/21/2012 PCP: [name], MD  Assessment/Plan: Principal Problem:  *Acute systolic HF (heart failure) Active Problems:  DM  GERD  CORONARY ARTERY BYPASS GRAFT, THREE VESSEL, HX OF  HTN (hypertension)  BPH (benign prostatic hyperplasia)  Hyperlipidemia  Dementia  Postural dizziness  Dyspnea  Abnormal EKG  1. Acute CHF.  Patient has known LV dysfunction with EF 35-40% post MI. He is already on betablocker, ACE, long acting nitrates.  He was started on lasix and feels a little better today.  Will continue with current treatments and monitor daily weights.  Will plan on outpatient follow up with cardiology. 2. DM, follow blood sugars, on SSI and oral agents 3. CAD with recent stent.  On antiplatelet agents.  Cardiac enzymes negative 4. Dementia.  Patient has some mild dementia.  He lives alone.  He is willing to consider placement in assisted living.  I think this will be helpful to manage his CHF.  Code Status: full code Family Communication: no family present Disposition Plan: possible transfer to ALF on discharge   Brief narrative: Jeffrey [name] is an 78 y.o. male. He obese Caucasian gentleman with mild dementia who lives alone and has been seen for 3 consecutive days for the same complaints of shortness of breath on exertion. Reports he fell out of bed 3 nights ago and has been having difficulty breathing since then.  In fact the patient had a recent NSTEMI, and had cardiac cath and DES on 03/10/2012. And did not have any difficulty breathing in the days immediately after his discharge from hospital.  He followed up with cardiology 03/19/2012, and complained of shortness of breath of one days duration but was considered sufficiently stable, and more concern was placed on the fact that he lives alone with mild dementia and seemed not fully able to care for himself. Patient concurred with this  plan assessment at that time, but later on during the visit she apparently forgot he was discussing that and denied asking about assisted living facility. Patient has in fact shown an interest in an assisted living facility in past admissions to this hospital, as he understands it difficult for him to continue living alone, and tends to come to the emergency room for the onset of every symptom.  The patient presented to the emergency room again yesterday 03/20/2012, again for shortness of breath and according to the records chest pain, as was assessed as being stable and discharged home.  Patient returns again today, and complains of shortness of breath even walking to the bathroom, he is to try to stand up and has to use the toilet sitting down. He denies any memory of chest pain yesterday or over the past days.  Of note his BNP is elevated to over 1300, and his recent cardiac catheterization, after NSTEMI, revealed an ejection fraction of 35-40%; 5 months ago in April his ejection fraction was 55%.   Consultants:  none  Procedures:  none  Antibiotics:  none  HPI/Subjective: Breathing is improving today, no chest pain.  Feels a little better overall today, good urine output  Objective: Filed Vitals:   03/22/12 0140 03/22/12 0500 03/22/12 0904 03/22/12 1034  BP: 123/70 146/76 144/77 96/60  Pulse: 70 72  77  Temp: 98.1 F (36.7 C) 98.2 F (36.8 C)  98.2 F (36.8 C)  TempSrc: Oral Oral  Oral  Resp: 18 20  20   Height:  Weight:  76.7 kg (169 lb 1.5 oz)    SpO2: 95% 96%  96%    Intake/Output Summary (Last 24 hours) at 03/22/12 1249 Last data filed at 03/22/12 0800  Gross per 24 hour  Intake    480 ml  Output   2450 ml  Net  -1970 ml   Filed Weights   03/21/12 2200 03/21/12 2215 03/22/12 0500  Weight: 78.8 kg (173 lb 11.6 oz) 78.8 kg (173 lb 11.6 oz) 76.7 kg (169 lb 1.5 oz)    Exam:   General:  NAD  Cardiovascular: s1, s2, rrr  Respiratory: cta b  Abdomen:  soft, nt, bs+  Data Reviewed: Basic Metabolic Panel:  Lab 03/22/12 1610 03/21/12 1821 03/20/12 0910  NA 135 133* 133*  K 4.0 4.0 4.2  CL 98 100 95*  CO2 28 25 24   GLUCOSE 176* 223* 199*  BUN 10 12 12   CREATININE 0.93 1.02 0.93  CALCIUM 9.6 9.6 9.9  MG 2.1 -- --  PHOS -- -- --   Liver Function Tests: No results found for this basename: AST:5,ALT:5,ALKPHOS:5,BILITOT:5,PROT:5,ALBUMIN:5 in the last 168 hours No results found for this basename: LIPASE:5,AMYLASE:5 in the last 168 hours No results found for this basename: AMMONIA:5 in the last 168 hours CBC:  Lab 03/21/12 1821 03/20/12 0910  WBC 5.9 10.2  NEUTROABS 3.6 8.1*  HGB 11.8* 13.0  HCT 35.4* 39.0  MCV 88.3 89.0  PLT 156 172   Cardiac Enzymes:  Lab 03/22/12 0445 03/21/12 1821 03/20/12 1216 03/20/12 0910  CKTOTAL -- -- -- --  CKMB -- -- -- --  CKMBINDEX -- -- -- --  TROPONINI <0.30 <0.30 <0.30 <0.30   BNP (last 3 results)  Basename 03/22/12 0445 03/21/12 1821 10/21/11 0058  PROBNP 1257.0* 1387.0* 267.3   CBG:  Lab 03/22/12 1145 03/22/12 0721 03/21/12 2222 03/20/12 0943  GLUCAP 167* 148* 162* 144*    No results found for this or any previous visit (from the past 240 hour(s)).   Studies: Dg Chest 2 View  03/21/2012  *RADIOLOGY REPORT*  Clinical Data: Shortness of breath.  CHEST - 2 VIEW  Comparison: 03/20/2012.  Findings: The cardiac silhouette, mediastinal and hilar contours are within normal limits and stable.  Stable surgical changes related to bypass surgery.  The lungs are clear.  No pleural effusion.  The bony thorax is intact. Stable lower thoracic compression deformities.  IMPRESSION: No acute cardiopulmonary findings.   Original Report Authenticated By: P. Loralie Champagne, M.D.    Ct Head Wo Contrast  03/21/2012  *RADIOLOGY REPORT*  Clinical Data: Altered mental status.  Weakness.  CT HEAD WITHOUT CONTRAST  Technique:  Contiguous axial images were obtained from the base of the skull through the vertex  without contrast.  Comparison: 01/23/2012.  Findings: Stable enlarged ventricles and subarachnoid spaces.  No significant change in mild patchy white matter low density in both cerebral hemispheres.  No intracranial hemorrhage, mass lesion or CT evidence of acute infarction.  Unremarkable bones and included paranasal sinuses.  A small amount of air is noted in the left vertebral artery on the first image.  IMPRESSION:  1.  Small amount of air in the left vertebral artery.  This could be due to air introduced at recent intravenous access if the patient has a right to left cardiac shunt. 2.  Stable atrophy and mild chronic small vessel white matter ischemic changes.   Original Report Authenticated By: Darrol Angel, M.D.     Scheduled Meds:   .  aspirin  325 mg Oral Daily  . atorvastatin  20 mg Oral q1800  . carvedilol  6.25 mg Oral BID WC  . enoxaparin  40 mg Subcutaneous Q24H  . furosemide  40 mg Intravenous Q12H  . insulin aspart  0-5 Units Subcutaneous QHS  . insulin aspart  0-9 Units Subcutaneous TID WC  . isosorbide mononitrate  30 mg Oral Daily  . meclizine  25 mg Oral Daily  . metFORMIN  500 mg Oral BID WC  . pantoprazole  40 mg Oral Daily  . potassium chloride  40 mEq Oral BID  . ramipril  5 mg Oral Daily  . sodium chloride  3 mL Intravenous Q12H  . Tamsulosin HCl  0.4 mg Oral QHS  . Ticagrelor  90 mg Oral BID   Continuous Infusions:   . DISCONTD: sodium chloride 1,000 mL (03/21/12 1857)    Principal Problem:  *Acute systolic HF (heart failure) Active Problems:  DM  GERD  CORONARY ARTERY BYPASS GRAFT, THREE VESSEL, HX OF  HTN (hypertension)  BPH (benign prostatic hyperplasia)  Hyperlipidemia  Dementia  Postural dizziness  Dyspnea  Abnormal EKG    Time spent: 25 mins    Musa Rewerts  [facility] Pager [phone]. If 7PM-7AM, please contact night-coverage at [email or web address], password [facility] 03/22/2012, 12:49 PM  LOS: 1 day

## 2012-03-22 NOTE — Clinical Social Work Psychosocial (Signed)
    Clinical Social Work Department BRIEF PSYCHOSOCIAL ASSESSMENT 03/22/2012  Patient:  Jeffrey [name], Jeffrey [name]     Account Number:  [id number]     Admit date:  03/21/2012  Clinical Social Worker:  Santa Genera, CLINICAL SOCIAL WORKER  Date/Time:  03/22/2012 12:00 N  Referred by:  Physician  Date Referred:  03/21/2012 Referred for  ALF Placement   Other Referral:   Interview type:  Patient Other interview type:    PSYCHOSOCIAL DATA Living Status:  ALONE Admitted from facility:   Level of care:   Primary support name:  Jeffrey [name] Primary support relationship to patient:  CHILD, ADULT Degree of support available:   Limited    CURRENT CONCERNS Current Concerns  Post-Acute Placement   Other Concerns:    SOCIAL WORK ASSESSMENT / PLAN CSW met w patient at bedside.  MD and PT have recommended patient consider ALF at discharge, but patient has been resistant to this possibility in the past.  Patient alert and oriented, seemed able to process information about post discharge options.  Patient referred due to concerns about living situation.  Patient recently moved to [place], after [place].  Patient is widowed for 5 years, has 30 yo daughter in area who is retired on disability from post office.  Son in law is also disabled, daughter does not drive.    Patient is retired Personnel officer.  Has multiple cardiac issues, finished rehab placement at [facility] approx 5 months ago.  Patient states that he feels unable to care for himself on his own, does not know how to cook, says he tires easily so finds it difficult to complete ADLs. Patient has limited family support due to local family members being disabled.    Patient initially resistant to ALF placement, but seems to be considering his inability to fully care for himself. Considering ALF placement in order to complete short term rehab process, and regain strength.  Multiple health  care personnel have recommended ALF to him, so he is now willing to consider the process.    CSW spoke w daughter who says she brings food periodically and cleans once/week.  When she encouraged father to move to [facility], wanted father to be in place where there were more socialization opportunties.  Says father is frequently dizzy and "scared" living by himself.  Father may not have full Medicaid at this point, per DSS his coverage ended in July 2013.   Assessment/plan status:  Psychosocial Support/Ongoing Assessment of Needs Other assessment/ plan:   CSW will check w [place] DSS to verify Medicaid coverage and prepare to fax out patient to ALFs in [facility]   Information/referral to community resources:   ALF list for [facility]    PATIENT'S/FAMILY'S RESPONSE TO PLAN OF CARE: Patient and daughter appreciative.    Santa Genera, LCSW Clinical Social Worker [phone])

## 2012-03-22 NOTE — Progress Notes (Signed)
*  PRELIMINARY RESULTS* Echocardiogram 2D Echocardiogram has been performed.  Jeffrey [name] 03/22/2012, 10:06 AM

## 2012-03-22 NOTE — Clinical Social Work Note (Signed)
Called [place] DSS to verify patient Medicaid.  Patient does not have full Medicaid and is over income for that program.  Family advised to apply for Special Assistance Medicaid on patient behalf, this may cover placement in ALF if patient qualifies.  Voice mail left for daughter, Court Joy to that effect.  Santa Genera, LCSW Clinical Social Worker [phone])

## 2012-03-22 NOTE — Clinical Social Work Placement (Signed)
    Clinical Social Work Department CLINICAL SOCIAL WORK PLACEMENT NOTE 03/23/2012  Patient:  TSUTOMU, BARFOOT  Account Number:  [id number] Admit date:  03/21/2012  Clinical Social Worker:  Santa Genera, CLINICAL SOCIAL WORKER  Date/time:  03/22/2012 12:00 N  Clinical Social Work is seeking post-discharge placement for this patient at the following level of care:   ASSISTED LIVING/REST HOME   (*CSW will update this form in Epic as items are completed)   03/22/2012  Patient/family provided with [facility] Department of Clinical Social Work's list of facilities offering this level of care within the geographic area requested by the patient (or if unable, by the patient's family).  03/22/2012  Patient/family informed of their freedom to choose among providers that offer the needed level of care, that participate in Medicare, Medicaid or managed care program needed by the patient, have an available bed and are willing to accept the patient.    Patient/family informed of [facility]' ownership interest in [facility], as well as of the fact that they are under no obligation to receive care at this facility.  PASARR submitted to EDS on 03/22/2012 PASARR number received from EDS on   FL2 transmitted to all facilities in geographic area requested by pt/family on  03/22/2012 FL2 transmitted to all facilities within larger geographic area on   Patient informed that his/her managed care company has contracts with or will negotiate with  certain facilities, including the following:     Patient/family informed of bed offers received:  03/23/2012 Patient chooses bed at  Physician recommends and patient chooses bed at    Patient to be transferred to  on   Patient to be transferred to facility by   The following physician request were entered in Epic:   Additional Comments: Patient given bed offers at [facility], [facility], [facility], [facility].  [facility]  considering patient and will assess today.  Patient states that he wants to go home, insists that his insurance will pay for someone to come in daily and cook for him.  Patient has MQB, which has limited benefits.  Family/daughter contacted yesterday and informed of situation and advised to apply for Special Assitance Medicaid on father's behalf. Daughter states she will do this today.  Patient does not seem able to understand limitations of his insurance coverage and is quite resistant to entering an ALF at this time.  CSW signing off as patient insists he is returning home and declining ALF placement  Santa Genera, LCSW Clinical Social Worker [phone])

## 2012-03-23 DIAGNOSIS — R42 Dizziness and giddiness: Secondary | ICD-10-CM

## 2012-03-23 LAB — URINE CULTURE

## 2012-03-23 LAB — BASIC METABOLIC PANEL
BUN: 21 mg/dL (ref 6–23)
Chloride: 98 mEq/L (ref 96–112)
Creatinine, Ser: 1.38 mg/dL — ABNORMAL HIGH (ref 0.50–1.35)
GFR calc Af Amer: 54 mL/min — ABNORMAL LOW (ref >90)
GFR calc non Af Amer: 46 mL/min — ABNORMAL LOW (ref >90)
Glucose, Bld: 170 mg/dL — ABNORMAL HIGH (ref 70–99)
Potassium: 4.6 mEq/L (ref 3.5–5.1)

## 2012-03-23 LAB — GLUCOSE, CAPILLARY
Glucose-Capillary: 164 mg/dL — ABNORMAL HIGH (ref 70–99)
Glucose-Capillary: 161 mg/dL — ABNORMAL HIGH (ref 70–99)

## 2012-03-23 MED ORDER — SODIUM CHLORIDE 0.9 % IJ SOLN
INTRAMUSCULAR | Status: AC
  Administered 2012-03-23: 17:00:00
  Filled 2012-03-23: qty 3

## 2012-03-23 MED ORDER — FUROSEMIDE 40 MG PO TABS
40.0000 mg | ORAL_TABLET | Freq: Every day | ORAL | Status: DC
  Administered 2012-03-23 – 2012-03-24 (×2): 40 mg via ORAL
  Filled 2012-03-23 (×2): qty 1

## 2012-03-23 MED ORDER — SODIUM CHLORIDE 0.9 % IV SOLN
INTRAVENOUS | Status: AC
  Administered 2012-03-23: 17:00:00 via INTRAVENOUS

## 2012-03-23 NOTE — Care Management Note (Unsigned)
    Page 1 of 2   03/24/2012     3:27:22 PM   CARE MANAGEMENT NOTE 03/24/2012  Patient:  Jeffrey [name], Jeffrey [name]   Account Number:  [id number]  Date Initiated:  03/23/2012  Documentation initiated by:  Rosemary Holms  Subjective/Objective Assessment:   Pt admitted from home where he lives alone. Daughter and Son in law are supportive but also have disibilities.     Action/Plan:   Pt states he does not wish to go to ALF at this time. He wants [facility] to come in and check on him and make his breakfast. CM explained that a RN ans SW could be ordered but [facility] does not do ADL such as making meals. The SW may be able to assist   Anticipated DC Date:  03/25/2012   Anticipated DC Plan:  HOME W HOME HEALTH SERVICES      DC Planning Services  CM consult      Choice offered to / List presented to:          [facility] arranged  HH-1 RN  HH-6 SOCIAL WORKER      [facility] agency  [facility]   Status of service:  In process, will continue to follow Medicare Important Message given?   (If response is "NO", the following Medicare IM given date fields will be blank) Date Medicare IM given:   Date Additional Medicare IM given:    Discharge Disposition:    Per UR Regulation:    If discussed at Long Length of Stay Meetings, dates discussed:    Comments:  cont...assist pt in getting those inhouse services. CM will leave a brochure from the [employer] with pt which has services for disabled and Medicaid eligible. 03/23/12 1500 Dezhane Staten RN BSN CM  03/24/12 1100 Mabell Esguerra Leanord Hawking RN BSN CM Gave pt a copy of the [employer] for services. [facility] set up for DC tomorrow.

## 2012-03-23 NOTE — Progress Notes (Signed)
[facility] PROGRESS NOTE  VUE PAVON ZOX:[id number] DOB: [birth year] DOA: 03/21/2012 PCP: [name], MD  Assessment/Plan: Principal Problem:  *Acute systolic HF (heart failure) Active Problems:  DM  GERD  CORONARY ARTERY BYPASS GRAFT, THREE VESSEL, HX OF  HTN (hypertension)  BPH (benign prostatic hyperplasia)  Hyperlipidemia  Dementia  Postural dizziness  Dyspnea  Abnormal EKG  1. Acute CHF.  Patient has known LV dysfunction with EF 35-40% post MI. He is already on betablocker, ACE, long acting nitrates.  Patient has adequately diuresed and is now on oral lasix.  Follow up with cardiology for further management. 2. Postural dizziness.  Likely orthostatic hypotension due to over diuresis.  Will check orthostatics and may need to replete some fluid. 3. DM, follow blood sugars, on SSI and oral agents 4. CAD with recent stent.  On antiplatelet agents.  Cardiac enzymes negative 5. Dispo.  Patient has declined any placement to ALF. Plans will be to discharge home with home health services.  Likely discharge tomorrow.  Code Status: full code Family Communication: no family present Disposition Plan: possible transfer to ALF on discharge   Brief narrative: Jeffrey [name] is an 78 y.o. male. He obese Caucasian gentleman with mild dementia who lives alone and has been seen for 3 consecutive days for the same complaints of shortness of breath on exertion. Reports he fell out of bed 3 nights ago and has been having difficulty breathing since then.  In fact the patient had a recent NSTEMI, and had cardiac cath and DES on 03/10/2012. And did not have any difficulty breathing in the days immediately after his discharge from hospital.  He followed up with cardiology 03/19/2012, and complained of shortness of breath of one days duration but was considered sufficiently stable, and more concern was placed on the fact that he lives alone with mild dementia and seemed not fully able to  care for himself. Patient concurred with this plan assessment at that time, but later on during the visit she apparently forgot he was discussing that and denied asking about assisted living facility. Patient has in fact shown an interest in an assisted living facility in past admissions to this hospital, as he understands it difficult for him to continue living alone, and tends to come to the emergency room for the onset of every symptom.  The patient presented to the emergency room again yesterday 03/20/2012, again for shortness of breath and according to the records chest pain, as was assessed as being stable and discharged home.  Patient returns again today, and complains of shortness of breath even walking to the bathroom, he is to try to stand up and has to use the toilet sitting down. He denies any memory of chest pain yesterday or over the past days.  Of note his BNP is elevated to over 1300, and his recent cardiac catheterization, after NSTEMI, revealed an ejection fraction of 35-40%; 5 months ago in April his ejection fraction was 55%.   Consultants:  none  Procedures:  none  Antibiotics:  none  HPI/Subjective: Breathing improving.  Feels very dizzy on standing.  Has to sit down after short periods.  Afraid that he will fall down since he is home alone.  Objective: Filed Vitals:   03/23/12 0122 03/23/12 0504 03/23/12 0848 03/23/12 1416  BP: 113/68 97/59 113/67 100/62  Pulse: 76 73  74  Temp: 98.2 F (36.8 C) 97.9 F (36.6 C)  97.9 F (36.6 C)  TempSrc: Oral Oral  Oral  Resp: 19 18  18   Height:      Weight:  75.5 kg (166 lb 7.2 oz)    SpO2: 94% 97%  94%    Intake/Output Summary (Last 24 hours) at 03/23/12 1533 Last data filed at 03/23/12 1300  Gross per 24 hour  Intake   1203 ml  Output   1525 ml  Net   -322 ml   Filed Weights   03/21/12 2215 03/22/12 0500 03/23/12 0504  Weight: 78.8 kg (173 lb 11.6 oz) 76.7 kg (169 lb 1.5 oz) 75.5 kg (166 lb 7.2 oz)     Exam:   General:  NAD  Cardiovascular: s1, s2, rrr  Respiratory: cta b  Abdomen: soft, nt, bs+  Data Reviewed: Basic Metabolic Panel:  Lab 03/23/12 1610 03/22/12 0445 03/21/12 1821 03/20/12 0910  NA 136 135 133* 133*  K 4.6 4.0 4.0 4.2  CL 98 98 100 95*  CO2 25 28 25 24   GLUCOSE 170* 176* 223* 199*  BUN 21 10 12 12   CREATININE 1.38* 0.93 1.02 0.93  CALCIUM 10.1 9.6 9.6 9.9  MG -- 2.1 -- --  PHOS -- -- -- --   Liver Function Tests: No results found for this basename: AST:5,ALT:5,ALKPHOS:5,BILITOT:5,PROT:5,ALBUMIN:5 in the last 168 hours No results found for this basename: LIPASE:5,AMYLASE:5 in the last 168 hours No results found for this basename: AMMONIA:5 in the last 168 hours CBC:  Lab 03/21/12 1821 03/20/12 0910  WBC 5.9 10.2  NEUTROABS 3.6 8.1*  HGB 11.8* 13.0  HCT 35.4* 39.0  MCV 88.3 89.0  PLT 156 172   Cardiac Enzymes:  Lab 03/22/12 0445 03/21/12 1821 03/20/12 1216 03/20/12 0910  CKTOTAL -- -- -- --  CKMB -- -- -- --  CKMBINDEX -- -- -- --  TROPONINI <0.30 <0.30 <0.30 <0.30   BNP (last 3 results)  Basename 03/22/12 0445 03/21/12 1821 10/21/11 0058  PROBNP 1257.0* 1387.0* 267.3   CBG:  Lab 03/23/12 1140 03/23/12 0746 03/22/12 2047 03/22/12 1640 03/22/12 1145  GLUCAP 161* 164* 160* 125* 167*    Recent Results (from the past 240 hour(s))  URINE CULTURE     Status: Normal   Collection Time   03/21/12  6:35 PM      Component Value Range Status Comment   Specimen Description URINE, CLEAN CATCH   Final    Special Requests NONE   Final    Culture  Setup Time 03/21/2012 19:00   Final    Colony Count NO GROWTH   Final    Culture NO GROWTH   Final    Report Status 03/23/2012 FINAL   Final      Studies: Dg Chest 2 View  03/21/2012  *RADIOLOGY REPORT*  Clinical Data: Shortness of breath.  CHEST - 2 VIEW  Comparison: 03/20/2012.  Findings: The cardiac silhouette, mediastinal and hilar contours are within normal limits and stable.  Stable surgical  changes related to bypass surgery.  The lungs are clear.  No pleural effusion.  The bony thorax is intact. Stable lower thoracic compression deformities.  IMPRESSION: No acute cardiopulmonary findings.   Original Report Authenticated By: P. Loralie Champagne, M.D.    Ct Head Wo Contrast  03/21/2012  *RADIOLOGY REPORT*  Clinical Data: Altered mental status.  Weakness.  CT HEAD WITHOUT CONTRAST  Technique:  Contiguous axial images were obtained from the base of the skull through the vertex without contrast.  Comparison: 01/23/2012.  Findings: Stable enlarged ventricles and subarachnoid spaces.  No significant change in mild  patchy white matter low density in both cerebral hemispheres.  No intracranial hemorrhage, mass lesion or CT evidence of acute infarction.  Unremarkable bones and included paranasal sinuses.  A small amount of air is noted in the left vertebral artery on the first image.  IMPRESSION:  1.  Small amount of air in the left vertebral artery.  This could be due to air introduced at recent intravenous access if the patient has a right to left cardiac shunt. 2.  Stable atrophy and mild chronic small vessel white matter ischemic changes.   Original Report Authenticated By: Darrol Angel, M.D.     Scheduled Meds:    . aspirin  325 mg Oral Daily  . atorvastatin  20 mg Oral q1800  . carvedilol  6.25 mg Oral BID WC  . enoxaparin  40 mg Subcutaneous Q24H  . furosemide  40 mg Oral Daily  . insulin aspart  0-5 Units Subcutaneous QHS  . insulin aspart  0-9 Units Subcutaneous TID WC  . isosorbide mononitrate  30 mg Oral Daily  . meclizine  25 mg Oral Daily  . metFORMIN  500 mg Oral BID WC  . pantoprazole  40 mg Oral Daily  . potassium chloride  40 mEq Oral BID  . ramipril  5 mg Oral Daily  . sodium chloride  3 mL Intravenous Q12H  . Tamsulosin HCl  0.4 mg Oral QHS  . Ticagrelor  90 mg Oral BID  . DISCONTD: furosemide  40 mg Intravenous Q12H   Continuous Infusions:   Principal Problem:   *Acute systolic HF (heart failure) Active Problems:  DM  GERD  CORONARY ARTERY BYPASS GRAFT, THREE VESSEL, HX OF  HTN (hypertension)  BPH (benign prostatic hyperplasia)  Hyperlipidemia  Dementia  Postural dizziness  Dyspnea  Abnormal EKG    Time spent: 25 mins    Jeffrey [name]  [facility] Pager [phone]. If 7PM-7AM, please contact night-coverage at [email or web address], password [facility] 03/23/2012, 3:33 PM  LOS: 2 days

## 2012-03-23 NOTE — Clinical Social Work Note (Signed)
CSW met w patient at bedside to present bed offers.  Patient stated that he does not want ALF placement, but wants to return home w home health.  Insists that his insurance will cover assistance w meal preparation and housekeeping.  Patient has [place] Medicaid only, verified w DSS Medicaid supervisor yesterday.  Advised patient that home health company would need to advise him on what his insurance would cover.  CSW signing off as patient is declining ALF placement at this time.  Will stand by to be recontacted if further needs arise.  Santa Genera, LCSW Clinical Social Worker [phone])

## 2012-03-24 DIAGNOSIS — I10 Essential (primary) hypertension: Secondary | ICD-10-CM

## 2012-03-24 DIAGNOSIS — I2510 Atherosclerotic heart disease of native coronary artery without angina pectoris: Secondary | ICD-10-CM

## 2012-03-24 LAB — GLUCOSE, CAPILLARY
Glucose-Capillary: 161 mg/dL — ABNORMAL HIGH (ref 70–99)
Glucose-Capillary: 186 mg/dL — ABNORMAL HIGH (ref 70–99)
Glucose-Capillary: 120 mg/dL — ABNORMAL HIGH (ref 70–99)

## 2012-03-24 LAB — BASIC METABOLIC PANEL
BUN: 24 mg/dL — ABNORMAL HIGH (ref 6–23)
CO2: 26 mEq/L (ref 19–32)
Calcium: 9.6 mg/dL (ref 8.4–10.5)
Chloride: 101 mEq/L (ref 96–112)
Creatinine, Ser: 1.13 mg/dL (ref 0.50–1.35)
Glucose, Bld: 161 mg/dL — ABNORMAL HIGH (ref 70–99)

## 2012-03-24 MED ORDER — POTASSIUM CHLORIDE ER 10 MEQ PO TBCR: 20.0000 meq | EXTENDED_RELEASE_TABLET | Freq: Every day | ORAL | Status: DC

## 2012-03-24 MED ORDER — FUROSEMIDE 20 MG PO TABS: 20.0000 mg | ORAL_TABLET | Freq: Every day | ORAL | Status: DC

## 2012-03-24 MED ORDER — INFLUENZA VIRUS VACC SPLIT PF IM SUSP
0.5000 mL | INTRAMUSCULAR | Status: DC
  Filled 2012-03-24: qty 0.5

## 2012-03-24 NOTE — Progress Notes (Signed)
Patient received discharge instructions along with follow up appointments and prescriptions. Patient verbalized understanding of all instructions. Patient was escorted by staff via wheelchair to vehicle. Patient  Discharged to home in stable condition.

## 2012-03-24 NOTE — Progress Notes (Signed)
UR Chart Review Completed  

## 2012-03-24 NOTE — Discharge Summary (Signed)
Physician Discharge Summary  PINO DUECK ZOX:[id number] DOB: 04-20-1929 DOA: 03/21/2012  PCP: [name], MD  Admit date: 03/21/2012 Discharge date: 03/24/2012  Recommendations for Outpatient Follow-up:  1. Follow up with Dr. Dietrich Pates on 10/09 as scheduled 2. Follow up with Dr. Sherwood Gambler in 2 weeks 3. Patient will be set up with home health services  Discharge Diagnoses:  Principal Problem:  *Acute systolic HF (heart failure) Active Problems:  DM  GERD  CORONARY ARTERY BYPASS GRAFT, THREE VESSEL, HX OF  HTN (hypertension)  BPH (benign prostatic hyperplasia)  Hyperlipidemia  Dementia  Postural dizziness  Dyspnea  Abnormal EKG   Discharge Condition: improved  Diet recommendation: low salt, low calorie  Filed Weights   03/21/12 2215 03/22/12 0500 03/23/12 0504  Weight: 78.8 kg (173 lb 11.6 oz) 76.7 kg (169 lb 1.5 oz) 75.5 kg (166 lb 7.2 oz)    History of present illness:  Jeffrey [name] is an 78 y.o. male. He obese Caucasian gentleman with mild dementia who lives alone and has been seen for 3 consecutive days for the same complaints of shortness of breath on exertion. Reports he fell out of bed 3 nights ago and has been having difficulty breathing since then.  In fact the patient had a recent NSTEMI, and had cardiac cath and DES on 03/10/2012. And did not have any difficulty breathing in the days immediately after his discharge from hospital.  He followed up with cardiology 03/19/2012, and complained of shortness of breath of one days duration but was considered sufficiently stable, and more concern was placed on the fact that he lives alone with mild dementia and seemed not fully able to care for himself. Patient concurred with this plan assessment at that time, but later on during the visit she apparently forgot he was discussing that and denied asking about assisted living facility. Patient has in fact shown an interest in an assisted living facility in past admissions to  this hospital, as he understands it difficult for him to continue living alone, and tends to come to the emergency room for the onset of every symptom.  The patient presented to the emergency room again yesterday 03/20/2012, again for shortness of breath and according to the records chest pain, as was assessed as being stable and discharged home.  Patient returns again today, and complains of shortness of breath even walking to the bathroom, he is to try to stand up and has to use the toilet sitting down. He denies any memory of chest pain yesterday or over the past days.  Of note his BNP is elevated to over 1300, and his recent cardiac catheterization, after NSTEMI, revealed an ejection fraction of 35-40%; 5 months ago in April his ejection fraction was 55%.  Denies fever cough or cold denies nausea or vomiting denies frequency or nice black or bloody stool   Hospital Course:  This gentleman was admitted with pulmonary edema related to his systolic CHF. He was adequately diuresed with lasix and his respiratory status has significantly improved.  He did have an episode of orthostatic hypotension due to overdiuresis, but that has since resolved.  He is ambulating without difficulty.  He has scheduled follow up in the cardiology clinic in the beginning of October. His last documented EF was 35-40% which was on recent cardiac cath.  He is on betablocker, ace inhibitor, long acting nitrates, aspirin, brilinta, and statin.  He was offered to go to an assisted living to help with his medication management and  ADLs, but he has declined.  He has been set up with home health services.  Procedures:  none  Consultations:  none  Discharge Exam: Filed Vitals:   03/23/12 2146 03/24/12 0517 03/24/12 1507 03/24/12 1534  BP: 101/54 148/71 101/64 136/81  Pulse: 74 70 79   Temp: 98.3 F (36.8 C) 98 F (36.7 C) 98.3 F (36.8 C)   TempSrc: Oral Oral Oral   Resp: 20 20 20    Height:      Weight:      SpO2:  95% 98% 97% 98%    General: NAD Cardiovascular: s1, s2, rrr Respiratory: cta b  Discharge Instructions  Discharge Orders    Future Appointments: Provider: Department: Dept Phone: Center:   04/21/2012 2:45 PM Kathlen Brunswick, MD [facility] [phone] [id number]     Future Orders Please Complete By Expires   Diet - low sodium heart healthy      Diet Carb Modified      Home Health      Questions: Responses:   To provide the following care/treatments RN   Face-to-face encounter      Comments:   I Jone Panebianco certify that this patient is under my care and that I, or a nurse practitioner or physician's assistant working with me, had a face-to-face encounter that meets the physician face-to-face encounter requirements with this patient on 03/24/2012.   Questions: Responses:   The encounter with the patient was in whole, or in part, for the following medical condition, which is the primary reason for home health care chf exacerbation   I certify that, based on my findings, the following services are medically necessary home health services Nursing   My clinical findings support the need for the above services Acute exacerbation of CHF   Further, I certify that my clinical findings support that this patient is homebound due to: Unsafe ambulation due to balance issues   To provide the following care/treatments RN   Increase activity slowly      (HEART FAILURE PATIENTS) Call MD:  Anytime you have any of the following symptoms: 1) 3 pound weight gain in 24 hours or 5 pounds in 1 week 2) shortness of breath, with or without a dry hacking cough 3) swelling in the hands, feet or stomach 4) if you have to sleep on extra pillows at night in order to breathe.      Call MD for:  persistant dizziness or light-headedness      Call MD for:  extreme fatigue          Medication List     As of 03/24/2012  4:34 PM    TAKE these medications         albuterol 108 (90 BASE) MCG/ACT  inhaler   Commonly known as: PROVENTIL HFA;VENTOLIN HFA   Inhale 1-2 puffs into the lungs every 6 (six) hours as needed.      aspirin 325 MG tablet   Take 325 mg by mouth daily. For pain      B-COMPLEX PO   Take 1 tablet by mouth daily.      calcium carbonate 500 MG chewable tablet   Commonly known as: TUMS - dosed in mg elemental calcium   Chew 1 tablet by mouth 4 (four) times daily - after meals and at bedtime.      carvedilol 6.25 MG tablet   Commonly known as: COREG   Take 6.25 mg by mouth 2 (two) times daily.      CRESTOR  10 MG tablet   Generic drug: rosuvastatin   TAKE ONE TABLET BY MOUTH DAILY FOR CHOLESTEROL.      furosemide 20 MG tablet   Commonly known as: LASIX   Take 1 tablet (20 mg total) by mouth daily.      isosorbide mononitrate 30 MG 24 hr tablet   Commonly known as: IMDUR   TAKE 1 TABLET BY MOUTH DAILY FOR HEART.      meclizine 25 MG tablet   Commonly known as: ANTIVERT   Take 25 mg by mouth daily. For dizziness      metFORMIN 500 MG tablet   Commonly known as: GLUCOPHAGE   Take 1 tablet (500 mg total) by mouth 2 (two) times daily. **RESUME ON 03/13/2012**      multivitamin with minerals Tabs   Take 1 tablet by mouth daily.      nitroGLYCERIN 0.4 MG SL tablet   Commonly known as: NITROSTAT   Place 1 tablet (0.4 mg total) under the tongue every 5 (five) minutes x 3 doses as needed for chest pain.      ONGLYZA 5 MG Tabs tablet   Generic drug: saxagliptin HCl   Take 5 mg by mouth daily at 12 noon.      pantoprazole 40 MG tablet   Commonly known as: PROTONIX   Take 40 mg by mouth every morning.      PEPTO-BISMOL PO   Take by mouth daily as needed. Patient states that he just takes a couple of swallows when needed to control bowels.      potassium chloride 10 MEQ tablet   Commonly known as: K-DUR   Take 2 tablets (20 mEq total) by mouth daily.      ramipril 5 MG capsule   Commonly known as: ALTACE   Take 5 mg by mouth daily. For blood pressure       STOOL SOFTENER 100 MG capsule   Generic drug: Docusate Sodium   Take 100 mg by mouth daily as needed. Constipation      Tamsulosin HCl 0.4 MG Caps   Commonly known as: FLOMAX   Take 0.4 mg by mouth at bedtime. For urinary health      Ticagrelor 90 MG Tabs tablet   Commonly known as: BRILINTA   Take 1 tablet (90 mg total) by mouth 2 (two) times daily.           Follow-up Information    Follow up with Seeley Bing, MD. On 04/21/2012. (2:45pm)    Contact information:   [address] [place] [address] [phone]       Follow up with [name], MD. Schedule an appointment as soon as possible for a visit in 2 weeks.   Contact information:   [address] [place] [address] [phone]           The results of significant diagnostics from this hospitalization (including imaging, microbiology, ancillary and laboratory) are listed below for reference.    Significant Diagnostic Studies: Dg Chest 2 View  03/21/2012  *RADIOLOGY REPORT*  Clinical Data: Shortness of breath.  CHEST - 2 VIEW  Comparison: 03/20/2012.  Findings: The cardiac silhouette, mediastinal and hilar contours are within normal limits and stable.  Stable surgical changes related to bypass surgery.  The lungs are clear.  No pleural effusion.  The bony thorax is intact. Stable lower thoracic compression deformities.  IMPRESSION: No acute cardiopulmonary findings.   Original Report Authenticated By: P. MARK  Pecolia Ades, M.D.    Dg Chest 2 View  03/09/2012  *RADIOLOGY REPORT*  Clinical Data: Chest burning status post fall.  History of stents.  CHEST - 2 VIEW  Comparison: 02/28/2012 and 01/23/2012 radiographs.  Findings: There are slightly lower lung volumes.  The heart size and mediastinal contours are stable status post CABG.  There is central airway thickening with mildly increased basilar atelectasis.  No confluent airspace opacity, edema or significant pleural effusion is  present.  IMPRESSION: Mild atelectasis superimposed on chronic central airway thickening. No acute cardiopulmonary process.   Original Report Authenticated By: Gerrianne Scale, M.D.    Dg Chest 2 View  02/28/2012  *RADIOLOGY REPORT*  Clinical Data: Chest pain.  CHEST - 2 VIEW  Comparison: Plain films of the chest 01/23/2012.  Findings: Heart size is normal.  The patient is status post CABG. Lungs are clear.  No pneumothorax or pleural fluid.  IMPRESSION: No acute disease.  Original Report Authenticated By: Bernadene Bell. Maricela Curet, M.D.   Ct Head Wo Contrast  03/21/2012  *RADIOLOGY REPORT*  Clinical Data: Altered mental status.  Weakness.  CT HEAD WITHOUT CONTRAST  Technique:  Contiguous axial images were obtained from the base of the skull through the vertex without contrast.  Comparison: 01/23/2012.  Findings: Stable enlarged ventricles and subarachnoid spaces.  No significant change in mild patchy white matter low density in both cerebral hemispheres.  No intracranial hemorrhage, mass lesion or CT evidence of acute infarction.  Unremarkable bones and included paranasal sinuses.  A small amount of air is noted in the left vertebral artery on the first image.  IMPRESSION:  1.  Small amount of air in the left vertebral artery.  This could be due to air introduced at recent intravenous access if the patient has a right to left cardiac shunt. 2.  Stable atrophy and mild chronic small vessel white matter ischemic changes.   Original Report Authenticated By: Darrol Angel, M.D.    Dg Chest Port 1 View  03/20/2012  *RADIOLOGY REPORT*  Clinical Data: Weakness and chest pain  PORTABLE CHEST - 1 VIEW  Comparison: Chest radiograph 03/09/2012 and 01/23/2012  Findings: There are changes of median sternotomy for CABG. Cardiac, mediastinal, and hilar contours are stable.  Minimal linear atelectasis in the left lung base.  The inferior costophrenic angles are excluded from the image.  No visible pleural effusion or airspace  disease.  IMPRESSION: Prior CABG.  No acute cardiopulmonary disease   Original Report Authenticated By: Britta Mccreedy, M.D.     Microbiology: Recent Results (from the past 240 hour(s))  URINE CULTURE     Status: Normal   Collection Time   03/21/12  6:35 PM      Component Value Range Status Comment   Specimen Description URINE, CLEAN CATCH   Final    Special Requests NONE   Final    Culture  Setup Time 03/21/2012 19:00   Final    Colony Count NO GROWTH   Final    Culture NO GROWTH   Final    Report Status 03/23/2012 FINAL   Final      Labs: Basic Metabolic Panel:  Lab 03/24/12 8295 03/23/12 0457 03/22/12 0445 03/21/12 1821 03/20/12 0910  NA 136 136 135 133* 133*  K 5.1 4.6 4.0 4.0 4.2  CL 101 98 98 100 95*  CO2 26 25 28 25 24   GLUCOSE 161* 170* 176* 223* 199*  BUN 24* 21 10 12 12   CREATININE 1.13 1.38* 0.93  1.02 0.93  CALCIUM 9.6 10.1 9.6 9.6 9.9  MG -- -- 2.1 -- --  PHOS -- -- -- -- --   Liver Function Tests: No results found for this basename: AST:5,ALT:5,ALKPHOS:5,BILITOT:5,PROT:5,ALBUMIN:5 in the last 168 hours No results found for this basename: LIPASE:5,AMYLASE:5 in the last 168 hours No results found for this basename: AMMONIA:5 in the last 168 hours CBC:  Lab 03/21/12 1821 03/20/12 0910  WBC 5.9 10.2  NEUTROABS 3.6 8.1*  HGB 11.8* 13.0  HCT 35.4* 39.0  MCV 88.3 89.0  PLT 156 172   Cardiac Enzymes:  Lab 03/22/12 0445 03/21/12 1821 03/20/12 1216 03/20/12 0910  CKTOTAL -- -- -- --  CKMB -- -- -- --  CKMBINDEX -- -- -- --  TROPONINI <0.30 <0.30 <0.30 <0.30   BNP: BNP (last 3 results)  Basename 03/22/12 0445 03/21/12 1821 10/21/11 0058  PROBNP 1257.0* 1387.0* 267.3   CBG:  Lab 03/24/12 1620 03/24/12 1112 03/24/12 0747 03/23/12 2141 03/23/12 1630  GLUCAP 120* 186* 161* 144* 147*    Time coordinating discharge: greater than 30 minutes  Signed:  Alieah Brinton  [facility] 03/24/2012, 4:34 PM

## 2012-04-05 ENCOUNTER — Telehealth: Payer: Self-pay | Admitting: Cardiology

## 2012-04-05 NOTE — Telephone Encounter (Signed)
Jeffrey [name] with [facility] states that he has been asymptomatic, however refuses to comply with low sodium diet, despite stringent teaching by [facility].  Please advise.

## 2012-04-05 NOTE — Telephone Encounter (Signed)
Attempted to contact Misty Stanley with [facility] to obtain additional information regarding patient's condition.  Message left for a return call.

## 2012-04-05 NOTE — Telephone Encounter (Signed)
ADVANCED CALLING TO REPORT THAT PT HAS GAINED 5 LBS SINCE 04/01/12/TMJ

## 2012-04-07 ENCOUNTER — Encounter: Payer: Self-pay | Admitting: Cardiology

## 2012-04-07 NOTE — Telephone Encounter (Signed)
Weight is lower than it has been for some time. Schedule return office visit.

## 2012-04-08 NOTE — Telephone Encounter (Signed)
Please schedule for ROV

## 2012-04-17 ENCOUNTER — Emergency Department (HOSPITAL_COMMUNITY)
Admission: EM | Admit: 2012-04-17 | Discharge: 2012-04-17 | Disposition: A | Discharge status: Home / Self Care | Payer: Medicare Other | Attending: Emergency Medicine | Admitting: Emergency Medicine

## 2012-04-17 ENCOUNTER — Encounter (HOSPITAL_COMMUNITY): Payer: Self-pay | Admitting: Emergency Medicine

## 2012-04-17 ENCOUNTER — Emergency Department (HOSPITAL_COMMUNITY): Payer: Medicare Other

## 2012-04-17 DIAGNOSIS — R0602 Shortness of breath: Secondary | ICD-10-CM | POA: Insufficient documentation

## 2012-04-17 DIAGNOSIS — R5383 Other fatigue: Secondary | ICD-10-CM | POA: Insufficient documentation

## 2012-04-17 DIAGNOSIS — I2510 Atherosclerotic heart disease of native coronary artery without angina pectoris: Secondary | ICD-10-CM

## 2012-04-17 DIAGNOSIS — I10 Essential (primary) hypertension: Secondary | ICD-10-CM | POA: Insufficient documentation

## 2012-04-17 DIAGNOSIS — R531 Weakness: Secondary | ICD-10-CM

## 2012-04-17 DIAGNOSIS — R0989 Other specified symptoms and signs involving the circulatory and respiratory systems: Secondary | ICD-10-CM | POA: Insufficient documentation

## 2012-04-17 DIAGNOSIS — I25810 Atherosclerosis of coronary artery bypass graft(s) without angina pectoris: Secondary | ICD-10-CM | POA: Insufficient documentation

## 2012-04-17 DIAGNOSIS — R079 Chest pain, unspecified: Secondary | ICD-10-CM | POA: Insufficient documentation

## 2012-04-17 DIAGNOSIS — E119 Type 2 diabetes mellitus without complications: Secondary | ICD-10-CM | POA: Insufficient documentation

## 2012-04-17 DIAGNOSIS — R5381 Other malaise: Secondary | ICD-10-CM | POA: Insufficient documentation

## 2012-04-17 LAB — BASIC METABOLIC PANEL
BUN: 18 mg/dL (ref 6–23)
Calcium: 10.1 mg/dL (ref 8.4–10.5)
GFR calc Af Amer: 73 mL/min — ABNORMAL LOW (ref >90)
GFR calc non Af Amer: 63 mL/min — ABNORMAL LOW (ref >90)
Glucose, Bld: 146 mg/dL — ABNORMAL HIGH (ref 70–99)
Sodium: 133 mEq/L — ABNORMAL LOW (ref 135–145)

## 2012-04-17 LAB — CBC
Hemoglobin: 12.6 g/dL — ABNORMAL LOW (ref 13.0–17.0)
MCH: 29.6 pg (ref 26.0–34.0)
MCHC: 33.7 g/dL (ref 30.0–36.0)
RDW: 14.5 % (ref 11.5–15.5)

## 2012-04-17 MED ORDER — ASPIRIN 325 MG PO TABS
325.0000 mg | ORAL_TABLET | ORAL | Status: AC
  Administered 2012-04-17: 325 mg via ORAL
  Filled 2012-04-17: qty 1

## 2012-04-17 NOTE — ED Notes (Signed)
Per EMS and patient: Pt with recent "blood and fluid around heart and lungs" and past bypass surgery with 6 stents, numerous CVA and MI. Had chest paint that started at 1pm and was relieved with tums and pepto, but felt "really weak like something is wrong".  Denies current pain.

## 2012-04-17 NOTE — ED Provider Notes (Signed)
History   This chart was scribed for Jeffrey Lyons, MD scribed by Magnus Sinning. The patient was seen in room [facility]10/[facility]10 at 15:20   CSN: [id number]  Arrival date & time 04/17/12  1501     Chief Complaint  Patient presents with  . Chest Pain    (Consider location/radiation/quality/duration/timing/severity/associated sxs/prior treatment) Patient is a 78 y.o. male presenting with chest pain. The history is provided by the patient. No language interpreter was used.  Chest Pain    Jeffrey [name] is a 78 y.o. male BIB EMS who presents to the Emergency Department complaining of constant moderate weakness, onset this morning with associated mild SOB. Patient states that he has been feeling weak and dizzy for the past few weeks, explaining that he has been having problems with his blood pressure medications. However, he says yesterday he felt fine and that he was not performing any strenuous work that would prompt weakness. Patient has extensive cardiac history with six stents, bypass surgery, several CVA and MI. He states he has had similar weakness in the past and was advised by PCP to present to the ED when he experiences sxs related to previous cardiac problems. He notes that he did experience mild chest pain this morning, but states that it was relieved by Tums and pepto. Pt denies lower extremity swelling, fevers, cough, dark stools, or blood in stools.  Pt does explain that his blood glucose usually in the 200s, but notes it has been in the 200s for several years.   Past Medical History  Diagnosis Date  . Diabetes mellitus   . Hypercholesterolemia   . Coronary artery disease     a.  s/p CABG x 4 1989, VG->OM1->OM2, VG->RCA, LIMA->LAD;  b. 05/24/10 Cath/PCI:  VG->OM1->OM2 90 in graft (3.0x15 promus) & 90 in distal LCX (2.75x15 Promus);  c. NSTEMI in 04/2011 - Cath occlusion of distal LCX stent and VG->OM2.;  d. 02/2012 NSTEMI Cath/PCI: 3VD, LIMA->LAD ok, VG->RCA 100, VG->OM1 99p (2.75x23mm  Resolute DES), continuation to OM2 occluded.  . Diabetes mellitus   . Hypertension   . PUD (peptic ulcer disease)   . Hypercholesteremia   . GERD (gastroesophageal reflux disease)   . Arthritis   . Osteoarthritis   . Stroke   . History of pneumonia   . Neck fracture   . Back pain     Past Surgical History  Procedure Date  . Coronary angioplasty with stent placement   . Tonsillectomy   . Post aortocoronary bypass surgery   . Coronary artery bypass graft   . Coronary stent placement   . Coronary angioplasty     History reviewed. No pertinent family history.  History  Substance Use Topics  . Smoking status: Former Smoker    Types: Cigarettes  . Smokeless tobacco: Current User    Types: Chew  . Alcohol Use: No      Review of Systems  Cardiovascular: Positive for chest pain.   10 Systems reviewed and are negative for acute change except as noted in the HPI. Allergies  Review of patient's allergies indicates no known allergies.  Home Medications   Current Outpatient Rx  Name Route Sig Dispense Refill  . ALBUTEROL SULFATE HFA 108 (90 BASE) MCG/ACT IN AERS Inhalation Inhale 1-2 puffs into the lungs every 6 (six) hours as needed. 1 Inhaler 6  . ASPIRIN 325 MG PO TABS Oral Take 325 mg by mouth daily. For pain    . B-COMPLEX PO Oral Take 1 tablet by  mouth daily.     Marland Kitchen PEPTO-BISMOL PO Oral Take by mouth daily as needed. Patient states that he just takes a couple of swallows when needed to control bowels.    Marland Kitchen CALCIUM CARBONATE ANTACID 500 MG PO CHEW Oral Chew 1 tablet by mouth 4 (four) times daily - after meals and at bedtime.    Marland Kitchen CARVEDILOL 6.25 MG PO TABS Oral Take 6.25 mg by mouth 2 (two) times daily.    . CRESTOR 10 MG PO TABS  TAKE ONE TABLET BY MOUTH DAILY FOR CHOLESTEROL. 30 each 11  . DOCUSATE SODIUM 100 MG PO TABS Oral Take 100 mg by mouth daily as needed. Constipation    . FUROSEMIDE 20 MG PO TABS Oral Take 1 tablet (20 mg total) by mouth daily. 30 tablet 1  .  ISOSORBIDE MONONITRATE ER 30 MG PO TB24  TAKE 1 TABLET BY MOUTH DAILY FOR HEART. 30 tablet 12  . MECLIZINE HCL 25 MG PO TABS Oral Take 25 mg by mouth daily. For dizziness    . METFORMIN HCL 500 MG PO TABS Oral Take 1 tablet (500 mg total) by mouth 2 (two) times daily. **RESUME ON 03/13/2012**    . ADULT MULTIVITAMIN W/MINERALS CH Oral Take 1 tablet by mouth daily.    Marland Kitchen NITROGLYCERIN 0.4 MG SL SUBL Sublingual Place 1 tablet (0.4 mg total) under the tongue every 5 (five) minutes x 3 doses as needed for chest pain. 25 tablet 3  . PANTOPRAZOLE SODIUM 40 MG PO TBEC Oral Take 40 mg by mouth every morning.    Marland Kitchen POTASSIUM CHLORIDE ER 10 MEQ PO TBCR Oral Take 2 tablets (20 mEq total) by mouth daily. 30 tablet 1  . RAMIPRIL 5 MG PO CAPS Oral Take 5 mg by mouth daily. For blood pressure    . SAXAGLIPTIN HCL 5 MG PO TABS Oral Take 5 mg by mouth daily at 12 noon.     Marland Kitchen TAMSULOSIN HCL 0.4 MG PO CAPS Oral Take 0.4 mg by mouth at bedtime. For urinary health    . TICAGRELOR 90 MG PO TABS Oral Take 1 tablet (90 mg total) by mouth 2 (two) times daily. 60 tablet 6    Ht 5\' 10"  (1.778 m)  Wt 175 lb (79.379 kg)  BMI 25.11 kg/m2  SpO2 96%  Physical Exam  Nursing note and vitals reviewed. Constitutional: He is oriented to person, place, and time. He appears well-developed and well-nourished. No distress.  HENT:  Head: Normocephalic and atraumatic.  Eyes: Conjunctivae normal and EOM are normal.  Neck: Neck supple. No tracheal deviation present.  Cardiovascular: Normal rate and regular rhythm.  Exam reveals no friction rub.   No murmur heard. Pulmonary/Chest: Effort normal. No respiratory distress. He has rales.       Rales in the bases bilaterally  Abdominal: Soft. He exhibits no distension. There is no tenderness.  Musculoskeletal: Normal range of motion. He exhibits no edema.  Neurological: He is alert and oriented to person, place, and time. No sensory deficit.  Skin: Skin is dry.  Psychiatric: He has a  normal mood and affect. His behavior is normal.    ED Course  Procedures (including critical care time) DIAGNOSTIC STUDIES: Oxygen Saturation is 96% on room air, normal by my interpretation.    COORDINATION OF CARE: 16:21: Recheck performed. Patient notes improvement. Also   Informed patient of lab results, noting nothing provided alarms based on cardiac history.  Labs Reviewed  CBC - Abnormal; Notable for the  following:    Hemoglobin 12.6 (*)     HCT 37.4 (*)     All other components within normal limits  BASIC METABOLIC PANEL - Abnormal; Notable for the following:    Sodium 133 (*)     Glucose, Bld 146 (*)     GFR calc non Af Amer 63 (*)     GFR calc Af Amer 73 (*)     All other components within normal limits  TROPONIN I  PROTIME-INR  PRO B NATRIURETIC PEPTIDE   Dg Chest Port 1 View  04/17/2012  *RADIOLOGY REPORT*  Clinical Data: Weakness.  History of CAD.  PORTABLE CHEST - 1 VIEW  Comparison: 03/21/2012  Findings: The patient has had median sternotomy and CABG.  Heart is normal in size.  There are no focal consolidations or pleural effusions.  No pulmonary edema.  Degenerative changes are seen in the thoracic spine.  IMPRESSION: No evidence for acute cardiopulmonary abnormality.   Original Report Authenticated By: Patterson Hammersmith, M.D.      No diagnosis found.   Date: 04/17/2012  Rate: 91  Rhythm: normal sinus rhythm  QRS Axis: normal  Intervals: normal  ST/T Wave abnormalities: normal  Conduction Disutrbances:none  Narrative Interpretation:   Old EKG Reviewed: unchanged    MDM   The patient presents here with weakness since last night.  He denies to me any chest pain or shortness of breath.  He did report some indigestion which resolved promptly with tums.  He has a significant pmh for cardiac problems that he tells me presented in a different way.  Today's workup does not reveal evidence for mi or chf.  He is not anemic, has no electrolyte disturbances, and  appears well-hydrated.  There are no ekg changes and the troponin is negative.  He is feeling better at this point and seems to be appropriate for discharge.  He will return to the er if he experiences further problems or if his condition worsens.     I personally performed the services described in this documentation, which was scribed in my presence. The recorded information has been reviewed and considered.          Jeffrey Lyons, MD 04/17/12 1630

## 2012-04-21 ENCOUNTER — Encounter: Payer: Self-pay | Admitting: *Deleted

## 2012-04-21 ENCOUNTER — Encounter: Payer: Self-pay | Admitting: Cardiology

## 2012-04-21 ENCOUNTER — Ambulatory Visit (INDEPENDENT_AMBULATORY_CARE_PROVIDER_SITE_OTHER): Payer: Medicare Other | Admitting: Cardiology

## 2012-04-21 VITALS — BP 127/62 | HR 89 | Ht 70.0 in | Wt 178.0 lb

## 2012-04-21 DIAGNOSIS — E119 Type 2 diabetes mellitus without complications: Secondary | ICD-10-CM | POA: Insufficient documentation

## 2012-04-21 DIAGNOSIS — I2510 Atherosclerotic heart disease of native coronary artery without angina pectoris: Secondary | ICD-10-CM

## 2012-04-21 DIAGNOSIS — E785 Hyperlipidemia, unspecified: Secondary | ICD-10-CM

## 2012-04-21 DIAGNOSIS — I10 Essential (primary) hypertension: Secondary | ICD-10-CM | POA: Insufficient documentation

## 2012-04-21 DIAGNOSIS — I7090 Unspecified atherosclerosis: Secondary | ICD-10-CM

## 2012-04-21 DIAGNOSIS — H269 Unspecified cataract: Secondary | ICD-10-CM

## 2012-04-21 NOTE — ED Provider Notes (Signed)
Medical screening examination/treatment/procedure(s) were performed by non-physician practitioner and as supervising physician I was immediately available for consultation/collaboration.  Jones Skene, M.D.  I did personally evaluate this patient in the emergency department.  Patient says he fell out of bed - to make him nervous that he may fall again. Patient does have a pertinent history of coronary artery disease however he's been able to get up walk around without any chest pain or shortness of breath. His workup is negative. I do not think the patient has any emergent intrathoracic or life-threatening emergency at this time. I think the patient is somewhat nervous to go home. Placement may be an issue with this patient in the future, however it does not require hospitalization at this time. Patient to followup with his cardiologist concerning his symptoms. Discussed this with the hospitalist this time was reviewed the record and agrees with this plan of care.    Jones Skene, MD 04/21/12 [id number]

## 2012-04-21 NOTE — Progress Notes (Signed)
Patient ID: Jeffrey [name], male   DOB: [birth year], 78 y.o.   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  He initially experienced weakness and dizziness after hospital discharge, but has subsequently improved.  He is focused on resuming driving, but his daughter wrote a letter indicating that she did not think he could drive safely.  He has cataracts with planned cataract surgery, but recently passed the eye test performed at the [facility].    Prior to Admission medications   Medication Sig Start Date End Date Taking? Authorizing Provider  albuterol (PROAIR HFA) 108 (90 BASE) MCG/ACT inhaler Inhale 1-2 puffs into the lungs every 6 (six) hours as needed. 01/29/12  Yes Jodelle Gross, NP  aspirin 325 MG tablet Take 325 mg by mouth daily. For pain   Yes Historical Provider, MD  B Complex-Biotin-FA (B-COMPLEX PO) Take 1 tablet by mouth daily.    Yes Historical Provider, MD  Bismuth Subsalicylate (PEPTO-BISMOL PO) Take by mouth daily as needed. Patient states that he just takes a couple of swallows when needed to control bowels.   Yes Historical Provider, MD  calcium carbonate (TUMS - DOSED IN MG ELEMENTAL CALCIUM) 500 MG chewable tablet Chew 1 tablet by mouth 4 (four) times daily - after meals and at bedtime.   Yes Historical Provider, MD  carvedilol (COREG) 6.25 MG tablet Take 6.25 mg by mouth 2 (two) times daily.   Yes Historical Provider, MD  CRESTOR 10 MG tablet TAKE ONE TABLET BY MOUTH DAILY FOR CHOLESTEROL. 03/18/12  Yes Jodelle Gross, NP  Docusate Sodium (STOOL SOFTENER) 100 MG capsule Take 100 mg by mouth daily as needed. Constipation   Yes Historical Provider, MD  furosemide (LASIX) 20 MG tablet Take 1 tablet (20 mg total) by mouth daily. 03/24/12 03/24/13 Yes Erick Blinks, MD  meclizine (ANTIVERT) 25  MG tablet Take 25 mg by mouth daily. For dizziness   Yes Historical Provider, MD  metFORMIN (GLUCOPHAGE) 500 MG tablet Take 1 tablet (500 mg total) by mouth 2 (two) times daily. **RESUME ON 03/13/2012** 03/12/12  Yes Ok Anis, NP  Multiple Vitamin (MULTIVITAMIN WITH MINERALS) TABS Take 1 tablet by mouth daily.   Yes Historical Provider, MD  nitroGLYCERIN (NITROSTAT) 0.4 MG SL tablet Place 1 tablet (0.4 mg total) under the tongue every 5 (five) minutes x 3 doses as needed for chest pain. 03/12/12 03/12/13 Yes Ok Anis, NP  pantoprazole (PROTONIX) 40 MG tablet Take 40 mg by mouth every morning. 12/17/11  Yes Christiane Ha, MD  potassium chloride (K-DUR) 10 MEQ tablet Take 2 tablets (20 mEq total) by mouth daily. 03/24/12 03/24/13 Yes Erick Blinks, MD  ramipril (ALTACE) 5 MG capsule Take 5 mg by mouth daily. For blood pressure   Yes Historical Provider, MD  saxagliptin HCl (ONGLYZA) 5 MG TABS tablet Take 5 mg by mouth daily at 12 noon.    Yes Historical Provider, MD  Tamsulosin HCl (FLOMAX) 0.4 MG CAPS Take 0.4 mg by mouth at bedtime. For urinary health   Yes Historical Provider, MD  Ticagrelor (BRILINTA) 90 MG TABS tablet Take 1 tablet (90 mg total) by mouth 2 (two) times daily. 03/12/12  Yes Ok Anis, NP  No Known Allergies    Past medical history, social history, and family history reviewed and updated.  ROS: Denies chest pain,  dyspnea, orthopnea, PND, lightheadedness, palpitations or syncope.  All other systems reviewed and are negative.  PHYSICAL EXAM: BP 127/62  Pulse 89  Ht 5\' 10"  (1.778 m)  Wt 80.74 kg (178 lb)  BMI 25.54 kg/m2  SpO2 97%   General-Well developed; no acute distress Body habitus-proportionate weight and height Neck-No JVD; no carotid bruits Lungs-clear lung fields; resonant to percussion Cardiovascular-normal PMI; normal S1 and S2 Abdomen-normal bowel sounds; soft and non-tender without masses or organomegaly Musculoskeletal-No  deformities, no cyanosis or clubbing Neurologic-Normal cranial nerves; symmetric strength and tone Skin-Warm, no significant lesions Extremities-distal pulses intact; no edema  ASSESSMENT AND PLAN:  Jeffrey Bing, MD 04/21/2012 4:04 PM

## 2012-04-21 NOTE — Patient Instructions (Addendum)
Your physician recommends that you schedule a follow-up appointment in: 1 month  Your physician has requested that you regularly monitor and record your blood pressure readings at home. Please use the same machine at the same time of day to check your readings and record them to bring to your follow-up visit.  Your physician recommends that you return for lab work in: 1 month  Your physician has recommended you make the following change in your medication:  1 - STOP Imdur
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
## 2012-04-22 ENCOUNTER — Other Ambulatory Visit (HOSPITAL_COMMUNITY): Payer: Self-pay | Admitting: Family Medicine

## 2012-04-22 ENCOUNTER — Ambulatory Visit (HOSPITAL_COMMUNITY)
Admission: RE | Admit: 2012-04-22 | Discharge: 2012-04-22 | Disposition: A | Discharge status: Home / Self Care | Payer: Medicare Other | Source: Ambulatory Visit | Attending: Family Medicine | Admitting: Family Medicine

## 2012-04-22 DIAGNOSIS — I509 Heart failure, unspecified: Secondary | ICD-10-CM | POA: Insufficient documentation

## 2012-04-22 DIAGNOSIS — H269 Unspecified cataract: Secondary | ICD-10-CM | POA: Insufficient documentation

## 2012-04-22 NOTE — Assessment & Plan Note (Signed)
Excellent control of hyperlipidemia with very low total and LDL cholesterol.  Current medication will be continued.

## 2012-04-22 NOTE — Assessment & Plan Note (Signed)
A1c-7.4 in 03/2012 indicating relatively good control of diabetes.
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
## 2012-04-22 NOTE — Assessment & Plan Note (Signed)
Blood pressure generally well controlled with current medication, which will be continued.

## 2012-04-23 ENCOUNTER — Encounter: Payer: Self-pay | Admitting: Cardiology

## 2012-05-05 ENCOUNTER — Encounter (HOSPITAL_COMMUNITY): Payer: Self-pay | Admitting: *Deleted

## 2012-05-05 ENCOUNTER — Emergency Department (HOSPITAL_COMMUNITY)
Admission: EM | Admit: 2012-05-05 | Discharge: 2012-05-05 | Disposition: A | Discharge status: Home / Self Care | Payer: Medicare Other | Attending: Emergency Medicine | Admitting: Emergency Medicine

## 2012-05-05 ENCOUNTER — Emergency Department (HOSPITAL_COMMUNITY): Payer: Medicare Other

## 2012-05-05 DIAGNOSIS — Z8781 Personal history of (healed) traumatic fracture: Secondary | ICD-10-CM | POA: Insufficient documentation

## 2012-05-05 DIAGNOSIS — Z8711 Personal history of peptic ulcer disease: Secondary | ICD-10-CM | POA: Insufficient documentation

## 2012-05-05 DIAGNOSIS — R5381 Other malaise: Secondary | ICD-10-CM | POA: Insufficient documentation

## 2012-05-05 DIAGNOSIS — K219 Gastro-esophageal reflux disease without esophagitis: Secondary | ICD-10-CM | POA: Insufficient documentation

## 2012-05-05 DIAGNOSIS — E785 Hyperlipidemia, unspecified: Secondary | ICD-10-CM | POA: Insufficient documentation

## 2012-05-05 DIAGNOSIS — I10 Essential (primary) hypertension: Secondary | ICD-10-CM | POA: Insufficient documentation

## 2012-05-05 DIAGNOSIS — Z79899 Other long term (current) drug therapy: Secondary | ICD-10-CM | POA: Insufficient documentation

## 2012-05-05 DIAGNOSIS — E119 Type 2 diabetes mellitus without complications: Secondary | ICD-10-CM | POA: Insufficient documentation

## 2012-05-05 DIAGNOSIS — Z7982 Long term (current) use of aspirin: Secondary | ICD-10-CM | POA: Insufficient documentation

## 2012-05-05 DIAGNOSIS — R5383 Other fatigue: Secondary | ICD-10-CM | POA: Insufficient documentation

## 2012-05-05 DIAGNOSIS — Z8679 Personal history of other diseases of the circulatory system: Secondary | ICD-10-CM | POA: Insufficient documentation

## 2012-05-05 DIAGNOSIS — M1990 Unspecified osteoarthritis, unspecified site: Secondary | ICD-10-CM | POA: Insufficient documentation

## 2012-05-05 DIAGNOSIS — Z8673 Personal history of transient ischemic attack (TIA), and cerebral infarction without residual deficits: Secondary | ICD-10-CM | POA: Insufficient documentation

## 2012-05-05 DIAGNOSIS — Z87891 Personal history of nicotine dependence: Secondary | ICD-10-CM | POA: Insufficient documentation

## 2012-05-05 LAB — URINALYSIS, ROUTINE W REFLEX MICROSCOPIC
Bilirubin Urine: NEGATIVE
Glucose, UA: NEGATIVE mg/dL
Ketones, ur: NEGATIVE mg/dL
Leukocytes, UA: NEGATIVE
pH: 6.5 (ref 5.0–8.0)

## 2012-05-05 LAB — BASIC METABOLIC PANEL
CO2: 27 mEq/L (ref 19–32)
Glucose, Bld: 188 mg/dL — ABNORMAL HIGH (ref 70–99)
Potassium: 4.6 mEq/L (ref 3.5–5.1)
Sodium: 137 mEq/L (ref 135–145)

## 2012-05-05 LAB — CBC WITH DIFFERENTIAL/PLATELET
Eosinophils Absolute: 0.4 10*3/uL (ref 0.0–0.7)
Eosinophils Relative: 5 % (ref 0–5)
HCT: 35.5 % — ABNORMAL LOW (ref 39.0–52.0)
Hemoglobin: 11.9 g/dL — ABNORMAL LOW (ref 13.0–17.0)
Lymphs Abs: 1.4 10*3/uL (ref 0.7–4.0)
MCH: 29.8 pg (ref 26.0–34.0)
MCV: 89 fL (ref 78.0–100.0)
Monocytes Absolute: 0.6 10*3/uL (ref 0.1–1.0)
Monocytes Relative: 8 % (ref 3–12)
RBC: 3.99 MIL/uL — ABNORMAL LOW (ref 4.22–5.81)

## 2012-05-05 MED ORDER — SODIUM CHLORIDE 0.9 % IV SOLN: INTRAVENOUS | Status: DC

## 2012-05-05 MED ORDER — SODIUM CHLORIDE 0.9 % IV BOLUS (SEPSIS): 500.0000 mL | Freq: Once | INTRAVENOUS | Status: DC

## 2012-05-05 MED ORDER — SODIUM CHLORIDE 0.9 % IV BOLUS (SEPSIS)
250.0000 mL | Freq: Once | INTRAVENOUS | Status: AC
  Administered 2012-05-05: 250 mL via INTRAVENOUS

## 2012-05-05 NOTE — ED Notes (Signed)
Awakened with weakness, no chest pain, sl sob

## 2012-05-05 NOTE — ED Notes (Signed)
Pt reports that his CBG at home this am was 210. Pt sugar was rechecked and is now 186.

## 2012-05-05 NOTE — ED Provider Notes (Signed)
History     CSN: [id number]  Arrival date & time 05/05/12  1109   First MD Initiated Contact with Patient 05/05/12 1208      Chief Complaint  Patient presents with  . Weakness     HPI Pt was seen at 1220.  Per pt, c/o gradual onset and persistence of constant generalized "weakness" that began yesterday.  Pt states he took his CBG at home at it was 210.  States he has been intermittently feeling "weak" since his NSTEMI with cardiac stent placement in 02/2012 (2 months ago).  Denies CP/palpitations, no SOB/cough, no abd pain, no back pain, no N/V/D, no focal motor weakness, no tingling/numbness in extremities, no fevers, no rash.    Past Medical History  Diagnosis Date  . Diabetes mellitus, type II   . Hyperlipidemia     Lipid profile in 02/2012:135, 227, 41, 49  . Arteriosclerotic cardiovascular disease (ASCVD)     CABG x 4 1989, VG->OM1->OM2, VG->RCA, LIMA->LAD; 05/24/10 Cath/PCI:  VG->OM1->OM2 90 in graft (3.0x15 promus) & 90 in distal LCX (2.75x15 Promus);  NSTEMI in 04/2011 - TO distal LCX stent and VG->OM2.; 02/2012 NSTEMI Cath/PCI: 3VD, LIMA->LAD ok, VG->RCA 100, VG->OM1 99p (2.75x67mm Resolute DES), continuation to OM2 occluded.  . Hypertension   . PUD (peptic ulcer disease)   . GERD (gastroesophageal reflux disease)   . Osteoarthritis   . Stroke   . History of pneumonia   . Neck fracture   . Back pain   . Benign prostatic hypertrophy     Required urinary catheter x4 weeks for retention  . Coronary artery disease     Past Surgical History  Procedure Date  . Coronary angioplasty with stent placement     2011, 02/2012  . Tonsillectomy   . Coronary artery bypass graft   . Cardiac surgery     History  Substance Use Topics  . Smoking status: Former Smoker    Types: Cigarettes  . Smokeless tobacco: Current User    Types: Chew  . Alcohol Use: No      Review of Systems ROS: Statement: All systems negative except as marked or noted in the HPI; Constitutional:  +generalized weakness/fatigue. Negative for fever and chills. ; ; Eyes: Negative for eye pain, redness and discharge. ; ; ENMT: Negative for ear pain, hoarseness, nasal congestion, sinus pressure and sore throat. ; ; Cardiovascular: Negative for chest pain, palpitations, diaphoresis, dyspnea and peripheral edema. ; ; Respiratory: Negative for cough, wheezing and stridor. ; ; Gastrointestinal: Negative for nausea, vomiting, diarrhea, abdominal pain, blood in stool, hematemesis, jaundice and rectal bleeding. . ; ; Genitourinary: Negative for dysuria, flank pain and hematuria. ; ; Musculoskeletal: Negative for back pain and neck pain. Negative for swelling and trauma.; ; Skin: Negative for pruritus, rash, abrasions, blisters, bruising and skin lesion.; ; Neuro: Negative for headache, lightheadedness and neck stiffness. Negative for altered level of consciousness , altered mental status, extremity weakness, paresthesias, involuntary movement, seizure and syncope.      Allergies  Review of patient's allergies indicates no known allergies.  Home Medications   Current Outpatient Rx  Name Route Sig Dispense Refill  . ALBUTEROL SULFATE HFA 108 (90 BASE) MCG/ACT IN AERS Inhalation Inhale 1-2 puffs into the lungs every 6 (six) hours as needed. 1 Inhaler 6  . ASPIRIN 325 MG PO TABS Oral Take 325 mg by mouth daily. For pain    . B-COMPLEX PO Oral Take 1 tablet by mouth daily.     Marland Kitchen  PEPTO-BISMOL PO Oral Take by mouth daily as needed. Patient states that he just takes a couple of swallows when needed to control bowels.    Marland Kitchen CALCIUM CARBONATE ANTACID 500 MG PO CHEW Oral Chew 1 tablet by mouth 4 (four) times daily - after meals and at bedtime.    Marland Kitchen DOCUSATE SODIUM 100 MG PO TABS Oral Take 100 mg by mouth at bedtime. Constipation    . ADULT MULTIVITAMIN W/MINERALS CH Oral Take 1 tablet by mouth daily.    Marland Kitchen CARVEDILOL 6.25 MG PO TABS Oral Take 6.25 mg by mouth 2 (two) times daily.    . FUROSEMIDE 20 MG PO TABS Oral  Take 1 tablet (20 mg total) by mouth daily. 30 tablet 1  . MECLIZINE HCL 25 MG PO TABS Oral Take 25 mg by mouth daily. For dizziness    . METFORMIN HCL 500 MG PO TABS Oral Take 1 tablet (500 mg total) by mouth 2 (two) times daily. **RESUME ON 03/13/2012**    . NITROGLYCERIN 0.4 MG SL SUBL Sublingual Place 1 tablet (0.4 mg total) under the tongue every 5 (five) minutes x 3 doses as needed for chest pain. 25 tablet 3  . PANTOPRAZOLE SODIUM 40 MG PO TBEC Oral Take 40 mg by mouth every morning.    Marland Kitchen POTASSIUM CHLORIDE ER 10 MEQ PO TBCR Oral Take 2 tablets (20 mEq total) by mouth daily. 30 tablet 1  . RAMIPRIL 5 MG PO CAPS Oral Take 5 mg by mouth daily. For blood pressure    . SAXAGLIPTIN HCL 5 MG PO TABS Oral Take 5 mg by mouth daily at 12 noon.     Marland Kitchen TAMSULOSIN HCL 0.4 MG PO CAPS Oral Take 0.4 mg by mouth at bedtime. For urinary health    . TICAGRELOR 90 MG PO TABS Oral Take 1 tablet (90 mg total) by mouth 2 (two) times daily. 60 tablet 6    BP 102/58  Pulse 89  Temp 98 F (36.7 C) (Oral)  Resp 18  Ht 5\' 10"  (1.778 m)  Wt 179 lb (81.194 kg)  BMI 25.68 kg/m2  SpO2 96%  Physical Exam 1230: Physical examination:  Nursing notes reviewed; Vital signs and O2 SAT reviewed;  Constitutional: Well developed, Well nourished, In no acute distress; Head:  Normocephalic, atraumatic; Eyes: EOMI, PERRL, No scleral icterus; ENMT: Mouth and pharynx normal, Mucous membranes dry; Neck: Supple, Full range of motion, No lymphadenopathy; Cardiovascular: Regular rate and rhythm, No gallop; Respiratory: Breath sounds clear & equal bilaterally, No rales, rhonchi, wheezes.  Speaking full sentences with ease, Normal respiratory effort/excursion; Chest: Nontender, Movement normal; Abdomen: Soft, Nontender, Nondistended, Normal bowel sounds;; Extremities: Pulses normal, No tenderness, No edema, No calf edema or asymmetry.; Neuro: AA&Ox3, Major CN grossly intact.  Speech clear. No facial droop. Gait steady. No gross focal  motor or sensory deficits in extremities.; Skin: Color normal, Warm, Dry.; Psych:  Affect flat.    ED Course  Procedures    MDM  MDM Reviewed: previous chart, nursing note and vitals Reviewed previous: ECG and labs Interpretation: ECG, labs and x-ray    Date: 05/05/2012  Rate: 85  Rhythm: normal sinus rhythm  QRS Axis: normal  Intervals: normal  ST/T Wave abnormalities: normal  Conduction Disutrbances:none  Narrative Interpretation:   Old EKG Reviewed: unchanged; no significant changes from previous EKG dated 04/17/2012.  Results for orders placed during the hospital encounter of 05/05/12  GLUCOSE, CAPILLARY      Component Value Range   Glucose-Capillary  186 (*) 70 - 99 mg/dL  BASIC METABOLIC PANEL      Component Value Range   Sodium 137  135 - 145 mEq/L   Potassium 4.6  3.5 - 5.1 mEq/L   Chloride 100  96 - 112 mEq/L   CO2 27  19 - 32 mEq/L   Glucose, Bld 188 (*) 70 - 99 mg/dL   BUN 15  6 - 23 mg/dL   Creatinine, Ser 1.61  0.50 - 1.35 mg/dL   Calcium 9.6  8.4 - 09.6 mg/dL   GFR calc non Af Amer 67 (*) >90 mL/min   GFR calc Af Amer 77 (*) >90 mL/min  CBC WITH DIFFERENTIAL      Component Value Range   WBC 7.3  4.0 - 10.5 K/uL   RBC 3.99 (*) 4.22 - 5.81 MIL/uL   Hemoglobin 11.9 (*) 13.0 - 17.0 g/dL   HCT 04.5 (*) 40.9 - 81.1 %   MCV 89.0  78.0 - 100.0 fL   MCH 29.8  26.0 - 34.0 pg   MCHC 33.5  30.0 - 36.0 g/dL   RDW 91.4  78.2 - 95.6 %   Platelets 155  150 - 400 K/uL   Neutrophils Relative 67  43 - 77 %   Neutro Abs 4.9  1.7 - 7.7 K/uL   Lymphocytes Relative 20  12 - 46 %   Lymphs Abs 1.4  0.7 - 4.0 K/uL   Monocytes Relative 8  3 - 12 %   Monocytes Absolute 0.6  0.1 - 1.0 K/uL   Eosinophils Relative 5  0 - 5 %   Eosinophils Absolute 0.4  0.0 - 0.7 K/uL   Basophils Relative 0  0 - 1 %   Basophils Absolute 0.0  0.0 - 0.1 K/uL  TROPONIN I      Component Value Range   Troponin I <0.30  <0.30 ng/mL  URINALYSIS, ROUTINE W REFLEX MICROSCOPIC      Component Value  Range   Color, Urine YELLOW  YELLOW   APPearance CLEAR  CLEAR   Specific Gravity, Urine 1.010  1.005 - 1.030   pH 6.5  5.0 - 8.0   Glucose, UA NEGATIVE  NEGATIVE mg/dL   Hgb urine dipstick NEGATIVE  NEGATIVE   Bilirubin Urine NEGATIVE  NEGATIVE   Ketones, ur NEGATIVE  NEGATIVE mg/dL   Protein, ur NEGATIVE  NEGATIVE mg/dL   Urobilinogen, UA 0.2  0.0 - 1.0 mg/dL   Nitrite NEGATIVE  NEGATIVE   Leukocytes, UA NEGATIVE  NEGATIVE   Dg Chest 2 View 05/05/2012  *RADIOLOGY REPORT*  Clinical Data: Weakness and short of breath  CHEST - 2 VIEW  Comparison: 04/22/2012  Findings: Prior CABG.  Heart size is normal and there is no heart failure.  Lungs are hyperinflated compatible with COPD.  Negative for pneumonia or effusion.  No mass lesion.  IMPRESSION: No acute cardiopulmonary disease.   Original Report Authenticated By: Camelia Phenes, M.D.     Results for ZYKEE, LOYA (MRN [id number]) as of 05/05/2012 16:24  Ref. Range 03/20/2012 09:10 03/21/2012 18:21 04/17/2012 15:03 05/05/2012 12:53  Hemoglobin Latest Range: 13.0-17.0 g/dL 46.9 62.9 (L) 52.8 (L) 11.9 (L)  HCT Latest Range: 39.0-52.0 % 39.0 35.4 (L) 37.4 (L) 35.5 (L)     1615:  After judicious IVF, pt's SBP remains improved and stable.  Current SBP 160's, HR 85, Sats 98% R/A.  Pt has ambulated around the ED with steady gait, easy resps, NAD.  Denies CP/SOB, no  focal motor weakness.  Pt has tol PO well without N/V.  No acute findings on workup today.  H/H per baseline.  Wants to go home now.  Dx and testing d/w pt.  Questions answered.  Verb understanding, agreeable to d/c home with outpt f/u.             Laray Anger, DO 05/07/12 1912

## 2012-05-07 LAB — URINE CULTURE: Colony Count: NO GROWTH

## 2012-05-11 ENCOUNTER — Other Ambulatory Visit: Payer: Self-pay | Admitting: Cardiology

## 2012-05-14 ENCOUNTER — Other Ambulatory Visit: Payer: Self-pay | Admitting: *Deleted

## 2012-05-14 DIAGNOSIS — I10 Essential (primary) hypertension: Secondary | ICD-10-CM

## 2012-05-17 ENCOUNTER — Emergency Department (HOSPITAL_COMMUNITY)
Admission: EM | Admit: 2012-05-17 | Discharge: 2012-05-17 | Disposition: A | Discharge status: Home / Self Care | Payer: Medicare Other | Attending: Emergency Medicine | Admitting: Emergency Medicine

## 2012-05-17 ENCOUNTER — Encounter (HOSPITAL_COMMUNITY): Payer: Self-pay | Admitting: *Deleted

## 2012-05-17 ENCOUNTER — Emergency Department (HOSPITAL_COMMUNITY): Payer: Medicare Other

## 2012-05-17 DIAGNOSIS — Z8673 Personal history of transient ischemic attack (TIA), and cerebral infarction without residual deficits: Secondary | ICD-10-CM | POA: Insufficient documentation

## 2012-05-17 DIAGNOSIS — I2510 Atherosclerotic heart disease of native coronary artery without angina pectoris: Secondary | ICD-10-CM | POA: Insufficient documentation

## 2012-05-17 DIAGNOSIS — Z8739 Personal history of other diseases of the musculoskeletal system and connective tissue: Secondary | ICD-10-CM | POA: Insufficient documentation

## 2012-05-17 DIAGNOSIS — R079 Chest pain, unspecified: Secondary | ICD-10-CM | POA: Insufficient documentation

## 2012-05-17 DIAGNOSIS — E785 Hyperlipidemia, unspecified: Secondary | ICD-10-CM | POA: Insufficient documentation

## 2012-05-17 DIAGNOSIS — Z8781 Personal history of (healed) traumatic fracture: Secondary | ICD-10-CM | POA: Insufficient documentation

## 2012-05-17 DIAGNOSIS — Z87448 Personal history of other diseases of urinary system: Secondary | ICD-10-CM | POA: Insufficient documentation

## 2012-05-17 DIAGNOSIS — Z951 Presence of aortocoronary bypass graft: Secondary | ICD-10-CM | POA: Insufficient documentation

## 2012-05-17 DIAGNOSIS — Z79899 Other long term (current) drug therapy: Secondary | ICD-10-CM | POA: Insufficient documentation

## 2012-05-17 DIAGNOSIS — Z8719 Personal history of other diseases of the digestive system: Secondary | ICD-10-CM | POA: Insufficient documentation

## 2012-05-17 DIAGNOSIS — Z9861 Coronary angioplasty status: Secondary | ICD-10-CM | POA: Insufficient documentation

## 2012-05-17 DIAGNOSIS — Z87891 Personal history of nicotine dependence: Secondary | ICD-10-CM | POA: Insufficient documentation

## 2012-05-17 DIAGNOSIS — I10 Essential (primary) hypertension: Secondary | ICD-10-CM | POA: Insufficient documentation

## 2012-05-17 DIAGNOSIS — E119 Type 2 diabetes mellitus without complications: Secondary | ICD-10-CM | POA: Insufficient documentation

## 2012-05-17 DIAGNOSIS — Z8701 Personal history of pneumonia (recurrent): Secondary | ICD-10-CM | POA: Insufficient documentation

## 2012-05-17 DIAGNOSIS — Z7982 Long term (current) use of aspirin: Secondary | ICD-10-CM | POA: Insufficient documentation

## 2012-05-17 LAB — CBC
Hemoglobin: 12.3 g/dL — ABNORMAL LOW (ref 13.0–17.0)
MCH: 29.9 pg (ref 26.0–34.0)
Platelets: 168 10*3/uL (ref 150–400)
RBC: 4.11 MIL/uL — ABNORMAL LOW (ref 4.22–5.81)
WBC: 7.4 10*3/uL (ref 4.0–10.5)

## 2012-05-17 LAB — BASIC METABOLIC PANEL
GFR calc non Af Amer: 75 mL/min — ABNORMAL LOW (ref >90)
Glucose, Bld: 184 mg/dL — ABNORMAL HIGH (ref 70–99)
Potassium: 4.6 mEq/L (ref 3.5–5.1)
Sodium: 135 mEq/L (ref 135–145)

## 2012-05-17 LAB — TROPONIN I: Troponin I: <0.3 ng/mL (ref <0.30)

## 2012-05-17 NOTE — ED Notes (Signed)
Reports central cp that started approx 1000 this am.  Took OTC tums, Nitro 0.4mg  SL x 2, and ASA 324mg  at home with no relief.  EMS gave nitro 0.4mg  SL x 1 en route with relief.   EMS reports decreased bp with nitro.

## 2012-05-17 NOTE — ED Notes (Signed)
IV infusion was stopped on 05/05/2012 at 1700.

## 2012-05-17 NOTE — ED Provider Notes (Addendum)
History   This chart was scribed for Jeffrey Hutching, MD by [name] . The patient was seen in room APA15/APA15. Patient's care was started at 1316.   CSN: [id number]  Arrival date & time 05/17/12  1110   First MD Initiated Contact with Patient 05/17/12 1316      Chief Complaint  Patient presents with  . Chest Pain   The history is provided by the patient. No language interpreter was used.  Jeffrey [name] is a 78 y.o. male who presents to the Emergency Department by ambulance complaining of moderate, upper central chest pain that started around 9:30 am this morning. He states it lasted 1.5 hours. He states the chest pain has since subsided. He states he took [employer], x2 nitroglycerin SL and 324 mg ASA with some relief. He denies any SOB, diaphoresis, nausea. He reports he had diarrhea this morning but states he takes stool softeners and pepto to control his BMs.   PCP: [facility]  Past Medical History  Diagnosis Date  . Diabetes mellitus, type II   . Hyperlipidemia     Lipid profile in 02/2012:135, 227, 41, 49  . Arteriosclerotic cardiovascular disease (ASCVD)     CABG x 4 1989, VG->OM1->OM2, VG->RCA, LIMA->LAD; 05/24/10 Cath/PCI:  VG->OM1->OM2 90 in graft (3.0x15 promus) & 90 in distal LCX (2.75x15 Promus);  NSTEMI in 04/2011 - TO distal LCX stent and VG->OM2.; 02/2012 NSTEMI Cath/PCI: 3VD, LIMA->LAD ok, VG->RCA 100, VG->OM1 99p (2.75x45mm Resolute DES), continuation to OM2 occluded.  . Hypertension   . PUD (peptic ulcer disease)   . GERD (gastroesophageal reflux disease)   . Osteoarthritis   . Stroke   . History of pneumonia   . Neck fracture   . Back pain   . Benign prostatic hypertrophy     Required urinary catheter x4 weeks for retention  . Coronary artery disease     Past Surgical History  Procedure Date  . Coronary angioplasty with stent placement     2011, 02/2012  . Tonsillectomy   . Coronary artery bypass graft   . Cardiac surgery     No family  history on file.  History  Substance Use Topics  . Smoking status: Former Smoker    Types: Cigarettes  . Smokeless tobacco: Current User    Types: Chew  . Alcohol Use: No      Review of Systems A complete 10 system review of systems was obtained and all systems are negative except as noted in the HPI and PMH.   Allergies  Review of patient's allergies indicates no known allergies.  Home Medications   Current Outpatient Rx  Name  Route  Sig  Dispense  Refill  . ALBUTEROL SULFATE HFA 108 (90 BASE) MCG/ACT IN AERS   Inhalation   Inhale 1-2 puffs into the lungs every 6 (six) hours as needed.   1 Inhaler   6   . ASPIRIN 325 MG PO TABS   Oral   Take 325 mg by mouth daily. For pain         . B-COMPLEX PO   Oral   Take 1 tablet by mouth daily.          Marland Kitchen PEPTO-BISMOL PO   Oral   Take by mouth daily as needed. Patient states that he just takes a couple of swallows when needed to control bowels.         Marland Kitchen CALCIUM CARBONATE ANTACID 500 MG PO CHEW   Oral  Chew 1 tablet by mouth 4 (four) times daily - after meals and at bedtime.         Marland Kitchen CARVEDILOL 6.25 MG PO TABS   Oral   Take 6.25 mg by mouth 2 (two) times daily.         Marland Kitchen DOCUSATE SODIUM 100 MG PO TABS   Oral   Take 100 mg by mouth at bedtime. Constipation         . FUROSEMIDE 20 MG PO TABS   Oral   Take 1 tablet (20 mg total) by mouth daily.   30 tablet   1   . ISOSORBIDE MONONITRATE ER 30 MG PO TB24   Oral   Take 30 mg by mouth daily.         . ADULT MULTIVITAMIN W/MINERALS CH   Oral   Take 1 tablet by mouth daily.         Marland Kitchen NITROFURANTOIN MACROCRYSTAL 100 MG PO CAPS   Oral   Take 100 mg by mouth at bedtime.         Marland Kitchen NITROGLYCERIN 0.4 MG SL SUBL   Sublingual   Place 1 tablet (0.4 mg total) under the tongue every 5 (five) minutes x 3 doses as needed for chest pain.   25 tablet   3   . PANTOPRAZOLE SODIUM 40 MG PO TBEC   Oral   Take 40 mg by mouth every morning.         Marland Kitchen  POTASSIUM CHLORIDE ER 10 MEQ PO TBCR   Oral   Take 2 tablets (20 mEq total) by mouth daily.   30 tablet   1   . RAMIPRIL 5 MG PO CAPS   Oral   Take 5 mg by mouth daily. For blood pressure         . ROSUVASTATIN CALCIUM 10 MG PO TABS   Oral   Take 10 mg by mouth daily.         Marland Kitchen SAXAGLIPTIN HCL 5 MG PO TABS   Oral   Take 5 mg by mouth daily at 12 noon.          Marland Kitchen TAMSULOSIN HCL 0.4 MG PO CAPS   Oral   Take 0.4 mg by mouth at bedtime. For urinary health         . TICAGRELOR 90 MG PO TABS   Oral   Take 1 tablet (90 mg total) by mouth 2 (two) times daily.   60 tablet   6   . METFORMIN HCL 500 MG PO TABS   Oral   Take 500 mg by mouth 2 (two) times daily.           BP 96/64  Temp 98.3 F (36.8 C) (Oral)  Resp 16  Ht 5\' 10"  (1.778 m)  Wt 180 lb (81.647 kg)  BMI 25.83 kg/m2  SpO2 99%  Physical Exam  Nursing note and vitals reviewed. Constitutional: He is oriented to person, place, and time. He appears well-developed and well-nourished.  HENT:  Head: Normocephalic and atraumatic.  Eyes: Conjunctivae normal and EOM are normal. Pupils are equal, round, and reactive to light.  Neck: Normal range of motion. Neck supple.  Cardiovascular: Normal rate, regular rhythm and normal heart sounds.   No murmur heard. Pulmonary/Chest: Effort normal and breath sounds normal. No respiratory distress. He has no wheezes.  Abdominal: Soft. Bowel sounds are normal. He exhibits no distension. There is no tenderness.  Musculoskeletal: Normal range of motion. He exhibits  no edema and no tenderness.  Neurological: He is alert and oriented to person, place, and time. No cranial nerve deficit.  Skin: Skin is warm and dry.  Psychiatric: He has a normal mood and affect.    ED Course  Procedures (including critical care time)  DIAGNOSTIC STUDIES: Oxygen Saturation is 99% on room air, normal by my interpretation.    COORDINATION OF CARE:  14:11-Discussed planned course of  treatment with the patient including checking imaging and lab results, who is agreeable at this time.    Results for orders placed during the hospital encounter of 05/17/12  BASIC METABOLIC PANEL      Component Value Range   Sodium 135  135 - 145 mEq/L   Potassium 4.6  3.5 - 5.1 mEq/L   Chloride 100  96 - 112 mEq/L   CO2 27  19 - 32 mEq/L   Glucose, Bld 184 (*) 70 - 99 mg/dL   BUN 15  6 - 23 mg/dL   Creatinine, Ser 9.60  0.50 - 1.35 mg/dL   Calcium 9.9  8.4 - 45.4 mg/dL   GFR calc non Af Amer 75 (*) >90 mL/min   GFR calc Af Amer 87 (*) >90 mL/min  CBC      Component Value Range   WBC 7.4  4.0 - 10.5 K/uL   RBC 4.11 (*) 4.22 - 5.81 MIL/uL   Hemoglobin 12.3 (*) 13.0 - 17.0 g/dL   HCT 09.8 (*) 11.9 - 14.7 %   MCV 89.1  78.0 - 100.0 fL   MCH 29.9  26.0 - 34.0 pg   MCHC 33.6  30.0 - 36.0 g/dL   RDW 82.9  56.2 - 13.0 %   Platelets 168  150 - 400 K/uL  TROPONIN I      Component Value Range   Troponin I <0.30  <0.30 ng/mL    Dg Chest Port 1 View  05/17/2012  *RADIOLOGY REPORT*  Clinical Data: Chest pain, shortness of breath, history hypertension, diabetes, GERD, coronary artery disease post CABG and stenting  PORTABLE CHEST - 1 VIEW  Comparison: Portable exam 1128 hours compared to 05/05/2012  Findings: Normal heart size post CABG. Mediastinal contours and pulmonary vascularity normal. Atherosclerotic calcification aortic arch. Minimal chronic peribronchial thickening. No acute infiltrate, pleural effusion or pneumothorax. Bones diffusely demineralized.  IMPRESSION: Post CABG. Minimal chronic bronchitic changes. No acute abnormalities.   Original Report Authenticated By: Ulyses Southward, M.D.      No diagnosis found.   Date: 05/17/2012  Rate: 80  Rhythm: normal sinus rhythm  QRS Axis: normal  Intervals: normal  ST/T Wave abnormalities: normal  Conduction Disutrbances: none  Narrative Interpretation: unremarkable     MDM  Patient is hemodynamically stable.  EKG, chest x-ray,  troponin all normal.  He feels better.  can discharge home  I personally performed the services described in this documentation, which was scribed in my presence. The recorded information has been reviewed and considered.       Jeffrey Hutching, MD 05/17/12 1651  Jeffrey Hutching, MD 05/17/12 [phone]

## 2012-05-21 ENCOUNTER — Telehealth: Payer: Self-pay | Admitting: Cardiology

## 2012-05-21 ENCOUNTER — Other Ambulatory Visit: Payer: Self-pay | Admitting: Cardiology

## 2012-05-21 NOTE — Telephone Encounter (Signed)
Pt states he was put on furosemide 20 mg when he was in hospital and is having a hard time getting it filled.

## 2012-05-21 NOTE — Telephone Encounter (Signed)
Refill was completed on this medication this morning.  Advised patient that this would be ready for pick up today.

## 2012-06-08 ENCOUNTER — Ambulatory Visit (INDEPENDENT_AMBULATORY_CARE_PROVIDER_SITE_OTHER): Payer: Medicaid Other | Admitting: Cardiology

## 2012-06-08 ENCOUNTER — Encounter: Payer: Self-pay | Admitting: Cardiology

## 2012-06-08 VITALS — BP 90/40 | HR 80 | Ht 70.0 in | Wt 185.0 lb

## 2012-06-08 DIAGNOSIS — E785 Hyperlipidemia, unspecified: Secondary | ICD-10-CM

## 2012-06-08 DIAGNOSIS — I2510 Atherosclerotic heart disease of native coronary artery without angina pectoris: Secondary | ICD-10-CM

## 2012-06-08 DIAGNOSIS — F0390 Unspecified dementia without behavioral disturbance: Secondary | ICD-10-CM

## 2012-06-08 DIAGNOSIS — I10 Essential (primary) hypertension: Secondary | ICD-10-CM

## 2012-06-08 DIAGNOSIS — I7090 Unspecified atherosclerosis: Secondary | ICD-10-CM

## 2012-06-08 DIAGNOSIS — R0989 Other specified symptoms and signs involving the circulatory and respiratory systems: Secondary | ICD-10-CM

## 2012-06-08 DIAGNOSIS — I679 Cerebrovascular disease, unspecified: Secondary | ICD-10-CM | POA: Insufficient documentation

## 2012-06-08 MED ORDER — RAMIPRIL 10 MG PO CAPS: 10.0000 mg | ORAL_CAPSULE | Freq: Every day | ORAL | Status: DC

## 2012-06-08 NOTE — Assessment & Plan Note (Signed)
Bilateral carotid bruits.  Carotid ultrasound study will be performed.

## 2012-06-08 NOTE — Progress Notes (Signed)
Patient ID: Jeffrey [name], male   DOB: August 18, 1928, 78 y.o.   MRN: [id number]  HPI: Scheduled return visit for this very pleasant gentleman with long-standing coronary artery disease, CABG surgery 25 years ago and multiple subsequent catheterizations and interventions.  In recent months, he has done generally well.  He has resumed driving and continues to care for himself with modest assistance from a daughter and granddaughter.  He fails to bring in his medication vials today as requested, and there is considerable confusion regarding exactly what medication he is taking.  Prior to Admission medications   Medication Sig Start Date End Date Taking? Authorizing Provider  albuterol (PROAIR HFA) 108 (90 BASE) MCG/ACT inhaler Inhale 1-2 puffs into the lungs every 6 (six) hours as needed. 01/29/12  Yes Jodelle Gross, NP  aspirin 325 MG tablet Take 325 mg by mouth daily. For pain   Yes Historical Provider, MD  B Complex-Biotin-FA (B-COMPLEX PO) Take 1 tablet by mouth daily.    Yes Historical Provider, MD  Bismuth Subsalicylate (PEPTO-BISMOL PO) Take by mouth daily as needed. Patient states that he just takes a couple of swallows when needed to control bowels.   Yes Historical Provider, MD  calcium carbonate (TUMS - DOSED IN MG ELEMENTAL CALCIUM) 500 MG chewable tablet Chew 1 tablet by mouth 4 (four) times daily - after meals and at bedtime.   Yes Historical Provider, MD  Docusate Sodium (STOOL SOFTENER) 100 MG capsule Take 100 mg by mouth at bedtime. Constipation   Yes Historical Provider, MD  metFORMIN (GLUCOPHAGE) 500 MG tablet Take 500 mg by mouth 2 (two) times daily. 03/12/12  Yes Ok Anis, NP  Multiple Vitamin (MULTIVITAMIN WITH MINERALS) TABS Take 1 tablet by mouth daily.   Yes Historical Provider, MD  nitrofurantoin (MACRODANTIN) 100 MG capsule Take 100 mg by mouth at bedtime.   Yes Historical Provider, MD  nitroGLYCERIN (NITROSTAT) 0.4 MG SL tablet Place 1 tablet (0.4 mg total) under  the tongue every 5 (five) minutes x 3 doses as needed for chest pain. 03/12/12 03/12/13 Yes Ok Anis, NP  pantoprazole (PROTONIX) 40 MG tablet Take 40 mg by mouth every morning. 12/17/11  Yes Christiane Ha, MD  ramipril (ALTACE) 10 MG capsule Take 1 capsule (10 mg total) by mouth daily. 06/08/12  Yes Kathlen Brunswick, MD  rosuvastatin (CRESTOR) 10 MG tablet Take 10 mg by mouth daily.   Yes Historical Provider, MD  saxagliptin HCl (ONGLYZA) 5 MG TABS tablet Take 5 mg by mouth daily at 12 noon.    Yes Historical Provider, MD  Tamsulosin HCl (FLOMAX) 0.4 MG CAPS Take 0.4 mg by mouth at bedtime. For urinary health   Yes Historical Provider, MD  Ticagrelor (BRILINTA) 90 MG TABS tablet Take 1 tablet (90 mg total) by mouth 2 (two) times daily. 03/12/12  Yes Ok Anis, NP  No Known Allergies    Past medical history, social history, and family history reviewed and updated.  ROS: Denies chest pain, dyspnea, orthopnea, PND and pedal edema.  He remains active including performing yard work, generally without difficulty.  Prior medical records extensively reviewed and summarized.  All other systems reviewed and are negative.  PHYSICAL EXAM: BP 90/40  Pulse 80  Ht 5\' 10"  (1.778 m)  Wt 83.915 kg (185 lb)  BMI 26.54 kg/m2  General-Well developed; no acute distress Body habitus-proportionate weight and height Neck-No JVD; bilateral carotid bruits Lungs-clear lung fields; resonant to percussion Cardiovascular-normal PMI; normal S1 and  S2; modest systolic ejection murmur Abdomen-normal bowel sounds; soft and non-tender without masses or organomegaly Musculoskeletal-No deformities, no cyanosis or clubbing Neurologic-Normal cranial nerves; symmetric strength and tone Skin-Warm, no significant lesions; dryness Extremities-distal pulses intact; no edema  ASSESSMENT AND PLAN:  Goodfield Bing, MD 06/08/2012 8:51 PM
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
## 2012-06-08 NOTE — Assessment & Plan Note (Signed)
Patient is relatively functional and has resumed driving, but clearly has some impairment in mental capacity which most immediately provides challenges in terms of maintaining a stable and optimal medical regime.  We have asked him to return with all medication vials for a review of same.

## 2012-06-08 NOTE — Assessment & Plan Note (Signed)
Remote history of CABG surgery with multiple subsequent interventions involving the SVG to OM1&2.  Multiple additional presentations with chest pain with medical therapy or catheterization showing stable coronary anatomy.  Continued optimal management of cardiovascular risk factors it is our current approach.

## 2012-06-08 NOTE — Assessment & Plan Note (Signed)
Excellent control of hypertension on multiple determinations over the past 3 months.  Only a single systolic above 140 mmHg recorded and no diastolics above 85.  Current regimen is efficacious and will be continued.

## 2012-06-08 NOTE — Patient Instructions (Addendum)
Your physician recommends that you schedule a follow-up appointment in:  1 - 2 months with RR 2 - Appointment to review your meds with the nurse (Bring ALL of you bottles to your visit)  Your physician has requested that you have a carotid duplex. This test is an ultrasound of the carotid arteries in your neck. It looks at blood flow through these arteries that supply the brain with blood. Allow one hour for this exam. There are no restrictions or special instructions.  Your physician has recommended you make the following change in your medication:  1 - STOP Antivert (Meclizine) - Dizzy pill 2 - STOP Potassium 3 - STOP Coreg (Carvedilol) 4 - INCREASE Altace to 10 mg daily

## 2012-06-08 NOTE — Assessment & Plan Note (Signed)
Excellent lipid values when last assessed 3 months ago.  Current therapy will be continued.

## 2012-06-14 ENCOUNTER — Ambulatory Visit (HOSPITAL_COMMUNITY)
Admission: RE | Admit: 2012-06-14 | Discharge: 2012-06-14 | Disposition: A | Discharge status: Home / Self Care | Payer: Medicare Other | Source: Ambulatory Visit | Attending: Cardiology | Admitting: Cardiology

## 2012-06-14 DIAGNOSIS — I658 Occlusion and stenosis of other precerebral arteries: Secondary | ICD-10-CM | POA: Insufficient documentation

## 2012-06-14 DIAGNOSIS — R0989 Other specified symptoms and signs involving the circulatory and respiratory systems: Secondary | ICD-10-CM | POA: Insufficient documentation

## 2012-06-14 DIAGNOSIS — I6529 Occlusion and stenosis of unspecified carotid artery: Secondary | ICD-10-CM | POA: Insufficient documentation

## 2012-06-17 ENCOUNTER — Encounter: Payer: Self-pay | Admitting: Cardiology

## 2012-06-18 ENCOUNTER — Other Ambulatory Visit: Payer: Self-pay

## 2012-06-18 ENCOUNTER — Encounter (HOSPITAL_COMMUNITY): Admission: EM | Disposition: A | Discharge status: Home / Self Care | Payer: Self-pay | Source: Home / Self Care

## 2012-06-18 ENCOUNTER — Inpatient Hospital Stay (HOSPITAL_COMMUNITY): Payer: Medicare Other

## 2012-06-18 ENCOUNTER — Encounter (HOSPITAL_COMMUNITY): Payer: Self-pay | Admitting: *Deleted

## 2012-06-18 ENCOUNTER — Ambulatory Visit (HOSPITAL_COMMUNITY): Admit: 2012-06-18 | Payer: Self-pay | Admitting: Cardiology

## 2012-06-18 ENCOUNTER — Inpatient Hospital Stay (HOSPITAL_COMMUNITY)
Admission: EM | Admit: 2012-06-18 | Discharge: 2012-06-21 | DRG: 247 | Disposition: A | Discharge status: Home / Self Care | Payer: Medicare Other | Attending: Emergency Medicine | Admitting: Emergency Medicine

## 2012-06-18 DIAGNOSIS — I679 Cerebrovascular disease, unspecified: Secondary | ICD-10-CM

## 2012-06-18 DIAGNOSIS — Z7902 Long term (current) use of antithrombotics/antiplatelets: Secondary | ICD-10-CM

## 2012-06-18 DIAGNOSIS — N138 Other obstructive and reflux uropathy: Secondary | ICD-10-CM | POA: Diagnosis present

## 2012-06-18 DIAGNOSIS — I2119 ST elevation (STEMI) myocardial infarction involving other coronary artery of inferior wall: Secondary | ICD-10-CM

## 2012-06-18 DIAGNOSIS — E119 Type 2 diabetes mellitus without complications: Secondary | ICD-10-CM

## 2012-06-18 DIAGNOSIS — I2510 Atherosclerotic heart disease of native coronary artery without angina pectoris: Secondary | ICD-10-CM

## 2012-06-18 DIAGNOSIS — I25810 Atherosclerosis of coronary artery bypass graft(s) without angina pectoris: Secondary | ICD-10-CM | POA: Diagnosis present

## 2012-06-18 DIAGNOSIS — I509 Heart failure, unspecified: Secondary | ICD-10-CM | POA: Diagnosis present

## 2012-06-18 DIAGNOSIS — Z7982 Long term (current) use of aspirin: Secondary | ICD-10-CM

## 2012-06-18 DIAGNOSIS — Z9861 Coronary angioplasty status: Secondary | ICD-10-CM

## 2012-06-18 DIAGNOSIS — I255 Ischemic cardiomyopathy: Secondary | ICD-10-CM

## 2012-06-18 DIAGNOSIS — K219 Gastro-esophageal reflux disease without esophagitis: Secondary | ICD-10-CM | POA: Diagnosis present

## 2012-06-18 DIAGNOSIS — I2589 Other forms of chronic ischemic heart disease: Secondary | ICD-10-CM | POA: Diagnosis present

## 2012-06-18 DIAGNOSIS — Z87891 Personal history of nicotine dependence: Secondary | ICD-10-CM

## 2012-06-18 DIAGNOSIS — N401 Enlarged prostate with lower urinary tract symptoms: Secondary | ICD-10-CM | POA: Diagnosis present

## 2012-06-18 DIAGNOSIS — I10 Essential (primary) hypertension: Secondary | ICD-10-CM

## 2012-06-18 DIAGNOSIS — Z79899 Other long term (current) drug therapy: Secondary | ICD-10-CM

## 2012-06-18 DIAGNOSIS — I5042 Chronic combined systolic (congestive) and diastolic (congestive) heart failure: Secondary | ICD-10-CM | POA: Diagnosis present

## 2012-06-18 DIAGNOSIS — R338 Other retention of urine: Secondary | ICD-10-CM | POA: Diagnosis present

## 2012-06-18 DIAGNOSIS — F0390 Unspecified dementia without behavioral disturbance: Secondary | ICD-10-CM | POA: Diagnosis present

## 2012-06-18 DIAGNOSIS — I213 ST elevation (STEMI) myocardial infarction of unspecified site: Secondary | ICD-10-CM

## 2012-06-18 DIAGNOSIS — Z8673 Personal history of transient ischemic attack (TIA), and cerebral infarction without residual deficits: Secondary | ICD-10-CM

## 2012-06-18 DIAGNOSIS — E785 Hyperlipidemia, unspecified: Secondary | ICD-10-CM | POA: Diagnosis present

## 2012-06-18 HISTORY — PX: PERCUTANEOUS CORONARY STENT INTERVENTION (PCI-S): SHX5485

## 2012-06-18 HISTORY — PX: LEFT HEART CATHETERIZATION WITH CORONARY ANGIOGRAM: SHX5451

## 2012-06-18 LAB — HEMOGLOBIN A1C
Hgb A1c MFr Bld: 8 % — ABNORMAL HIGH (ref <5.7)
Mean Plasma Glucose: 183 mg/dL — ABNORMAL HIGH (ref <117)

## 2012-06-18 LAB — POCT I-STAT TROPONIN I

## 2012-06-18 LAB — CBC
HCT: 34.9 % — ABNORMAL LOW (ref 39.0–52.0)
Hemoglobin: 11.7 g/dL — ABNORMAL LOW (ref 13.0–17.0)
MCHC: 33.5 g/dL (ref 30.0–36.0)
WBC: 8.2 10*3/uL (ref 4.0–10.5)

## 2012-06-18 LAB — COMPREHENSIVE METABOLIC PANEL
ALT: 26 U/L (ref 0–53)
Albumin: 3.6 g/dL (ref 3.5–5.2)
Alkaline Phosphatase: 59 U/L (ref 39–117)
Chloride: 100 mEq/L (ref 96–112)
Glucose, Bld: 209 mg/dL — ABNORMAL HIGH (ref 70–99)
Potassium: 4 mEq/L (ref 3.5–5.1)
Sodium: 136 mEq/L (ref 135–145)
Total Bilirubin: 0.2 mg/dL — ABNORMAL LOW (ref 0.3–1.2)
Total Protein: 6.4 g/dL (ref 6.0–8.3)

## 2012-06-18 LAB — POCT ACTIVATED CLOTTING TIME: Activated Clotting Time: 424 seconds

## 2012-06-18 LAB — GLUCOSE, CAPILLARY: Glucose-Capillary: 202 mg/dL — ABNORMAL HIGH (ref 70–99)

## 2012-06-18 LAB — APTT: aPTT: 28 seconds (ref 24–37)

## 2012-06-18 SURGERY — LEFT HEART CATHETERIZATION WITH CORONARY ANGIOGRAM
Anesthesia: LOCAL

## 2012-06-18 MED ORDER — CALCIUM CARBONATE ANTACID 500 MG PO CHEW
1.0000 | CHEWABLE_TABLET | Freq: Three times a day (TID) | ORAL | Status: DC
  Administered 2012-06-18 – 2012-06-21 (×11): 200 mg via ORAL
  Filled 2012-06-18 (×15): qty 1

## 2012-06-18 MED ORDER — CARVEDILOL 6.25 MG PO TABS
6.2500 mg | ORAL_TABLET | Freq: Two times a day (BID) | ORAL | Status: DC
  Administered 2012-06-18 – 2012-06-21 (×6): 6.25 mg via ORAL
  Filled 2012-06-18 (×8): qty 1

## 2012-06-18 MED ORDER — POTASSIUM CHLORIDE CRYS ER 10 MEQ PO TBCR
10.0000 meq | EXTENDED_RELEASE_TABLET | Freq: Every day | ORAL | Status: DC
  Administered 2012-06-18 – 2012-06-21 (×4): 10 meq via ORAL
  Filled 2012-06-18 (×4): qty 1

## 2012-06-18 MED ORDER — NITROFURANTOIN MACROCRYSTAL 100 MG PO CAPS
100.0000 mg | ORAL_CAPSULE | Freq: Every day | ORAL | Status: DC
  Administered 2012-06-18 – 2012-06-20 (×3): 100 mg via ORAL
  Filled 2012-06-18 (×4): qty 1

## 2012-06-18 MED ORDER — ONDANSETRON HCL 4 MG/2ML IJ SOLN: 4.0000 mg | Freq: Four times a day (QID) | INTRAMUSCULAR | Status: DC | PRN

## 2012-06-18 MED ORDER — ALBUTEROL SULFATE HFA 108 (90 BASE) MCG/ACT IN AERS
1.0000 | INHALATION_SPRAY | Freq: Four times a day (QID) | RESPIRATORY_TRACT | Status: DC | PRN
  Filled 2012-06-18: qty 6.7

## 2012-06-18 MED ORDER — TAMSULOSIN HCL 0.4 MG PO CAPS
0.4000 mg | ORAL_CAPSULE | Freq: Every day | ORAL | Status: DC
  Administered 2012-06-18 – 2012-06-20 (×3): 0.4 mg via ORAL
  Filled 2012-06-18 (×4): qty 1

## 2012-06-18 MED ORDER — RAMIPRIL 10 MG PO CAPS
10.0000 mg | ORAL_CAPSULE | Freq: Every day | ORAL | Status: DC
  Administered 2012-06-18 – 2012-06-21 (×4): 10 mg via ORAL
  Filled 2012-06-18 (×4): qty 1

## 2012-06-18 MED ORDER — INSULIN ASPART 100 UNIT/ML ~~LOC~~ SOLN
0.0000 [IU] | Freq: Three times a day (TID) | SUBCUTANEOUS | Status: DC
  Administered 2012-06-19: 3 [IU] via SUBCUTANEOUS
  Administered 2012-06-19 (×2): 5 [IU] via SUBCUTANEOUS
  Administered 2012-06-20: 3 [IU] via SUBCUTANEOUS
  Administered 2012-06-20: 4 [IU] via SUBCUTANEOUS
  Administered 2012-06-20 – 2012-06-21 (×3): 5 [IU] via SUBCUTANEOUS

## 2012-06-18 MED ORDER — ATORVASTATIN CALCIUM 80 MG PO TABS
80.0000 mg | ORAL_TABLET | Freq: Every day | ORAL | Status: DC
  Administered 2012-06-18 – 2012-06-20 (×3): 80 mg via ORAL
  Filled 2012-06-18 (×4): qty 1

## 2012-06-18 MED ORDER — NITROGLYCERIN IN D5W 200-5 MCG/ML-% IV SOLN
INTRAVENOUS | Status: AC
  Filled 2012-06-18: qty 250

## 2012-06-18 MED ORDER — DOCUSATE SODIUM 100 MG PO TABS
100.0000 mg | ORAL_TABLET | Freq: Every day | ORAL | Status: DC
  Administered 2012-06-19: 100 mg via ORAL
  Filled 2012-06-18 (×5): qty 1

## 2012-06-18 MED ORDER — B-COMPLEX PO TABS: 1.0000 | ORAL_TABLET | Freq: Every day | ORAL | Status: DC

## 2012-06-18 MED ORDER — HEPARIN (PORCINE) IN NACL 100-0.45 UNIT/ML-% IJ SOLN
INTRAMUSCULAR | Status: AC
  Filled 2012-06-18: qty 250

## 2012-06-18 MED ORDER — TICAGRELOR 90 MG PO TABS
90.0000 mg | ORAL_TABLET | Freq: Two times a day (BID) | ORAL | Status: DC
  Administered 2012-06-18 – 2012-06-21 (×6): 90 mg via ORAL
  Filled 2012-06-18 (×7): qty 1

## 2012-06-18 MED ORDER — INSULIN ASPART 100 UNIT/ML ~~LOC~~ SOLN
0.0000 [IU] | Freq: Every day | SUBCUTANEOUS | Status: DC
  Administered 2012-06-20: 3 [IU] via SUBCUTANEOUS

## 2012-06-18 MED ORDER — SODIUM CHLORIDE 0.9 % IV SOLN
INTRAVENOUS | Status: DC
  Administered 2012-06-18: 1000 mL via INTRAVENOUS

## 2012-06-18 MED ORDER — SODIUM CHLORIDE 0.9 % IV SOLN: 250.0000 mL | INTRAVENOUS | Status: DC | PRN

## 2012-06-18 MED ORDER — ZOLPIDEM TARTRATE 5 MG PO TABS: 5.0000 mg | ORAL_TABLET | Freq: Every evening | ORAL | Status: DC | PRN

## 2012-06-18 MED ORDER — ADULT MULTIVITAMIN W/MINERALS CH
1.0000 | ORAL_TABLET | Freq: Every day | ORAL | Status: DC
  Administered 2012-06-19 – 2012-06-21 (×3): 1 via ORAL
  Filled 2012-06-18 (×3): qty 1

## 2012-06-18 MED ORDER — SODIUM CHLORIDE 0.9 % IV SOLN
1.0000 mL/kg/h | INTRAVENOUS | Status: AC
  Administered 2012-06-18: 1.005 mL/kg/h via INTRAVENOUS

## 2012-06-18 MED ORDER — ATORVASTATIN CALCIUM 20 MG PO TABS: 20.0000 mg | ORAL_TABLET | Freq: Every day | ORAL | Status: DC

## 2012-06-18 MED ORDER — ATROPINE SULFATE 1 MG/ML IJ SOLN
INTRAMUSCULAR | Status: AC
  Filled 2012-06-18: qty 1

## 2012-06-18 MED ORDER — ASPIRIN EC 81 MG PO TBEC
81.0000 mg | DELAYED_RELEASE_TABLET | Freq: Every day | ORAL | Status: DC
  Filled 2012-06-18: qty 1

## 2012-06-18 MED ORDER — SODIUM CHLORIDE 0.9 % IJ SOLN: 3.0000 mL | Freq: Two times a day (BID) | INTRAMUSCULAR | Status: DC

## 2012-06-18 MED ORDER — ASPIRIN 81 MG PO CHEW
324.0000 mg | CHEWABLE_TABLET | Freq: Once | ORAL | Status: AC
  Administered 2012-06-18: 324 mg via ORAL
  Filled 2012-06-18: qty 4

## 2012-06-18 MED ORDER — ACETAMINOPHEN 325 MG PO TABS: 650.0000 mg | ORAL_TABLET | ORAL | Status: DC | PRN

## 2012-06-18 MED ORDER — PANTOPRAZOLE SODIUM 40 MG PO TBEC
40.0000 mg | DELAYED_RELEASE_TABLET | Freq: Every day | ORAL | Status: DC
  Administered 2012-06-19 – 2012-06-21 (×3): 40 mg via ORAL
  Filled 2012-06-18 (×3): qty 1

## 2012-06-18 MED ORDER — BISMUTH SUBSALICYLATE 262 MG/15ML PO SUSP
30.0000 mL | ORAL | Status: DC | PRN
  Filled 2012-06-18: qty 236

## 2012-06-18 MED ORDER — NITROGLYCERIN 0.4 MG SL SUBL: 0.4000 mg | SUBLINGUAL_TABLET | SUBLINGUAL | Status: DC | PRN

## 2012-06-18 MED ORDER — LINAGLIPTIN 5 MG PO TABS
5.0000 mg | ORAL_TABLET | Freq: Every day | ORAL | Status: DC
  Administered 2012-06-18 – 2012-06-21 (×4): 5 mg via ORAL
  Filled 2012-06-18 (×4): qty 1

## 2012-06-18 MED ORDER — HEPARIN SODIUM (PORCINE) 5000 UNIT/ML IJ SOLN
4000.0000 [IU] | INTRAMUSCULAR | Status: AC
  Administered 2012-06-18: 4000 [IU] via INTRAVENOUS

## 2012-06-18 MED ORDER — FUROSEMIDE 20 MG PO TABS
20.0000 mg | ORAL_TABLET | Freq: Every day | ORAL | Status: DC
  Administered 2012-06-18 – 2012-06-21 (×4): 20 mg via ORAL
  Filled 2012-06-18 (×4): qty 1

## 2012-06-18 MED ORDER — SODIUM CHLORIDE 0.9 % IJ SOLN: 3.0000 mL | INTRAMUSCULAR | Status: DC | PRN

## 2012-06-18 MED ORDER — B COMPLEX-C PO TABS
1.0000 | ORAL_TABLET | Freq: Every day | ORAL | Status: DC
  Administered 2012-06-19 – 2012-06-21 (×3): 1 via ORAL
  Filled 2012-06-18 (×3): qty 1

## 2012-06-18 MED ORDER — ALPRAZOLAM 0.25 MG PO TABS: 0.2500 mg | ORAL_TABLET | Freq: Two times a day (BID) | ORAL | Status: DC | PRN

## 2012-06-18 NOTE — H&P (Signed)
History and Physical   Patient ID: Jeffrey [name] MRN: [id number], DOB/AGE: 10/02/1928 78 y.o. Date of Encounter: 06/18/2012  Primary Physician: [name], MD Primary Cardiologist: RR  Chief Complaint:  Inferior STEMI  HPI: 78 year old male with a history of CAD (Last PCI 02/2012) has occasional chest pain, he thinks sometimes it's indigestion but cannot be sure. For the last 3 days, he has been feeling poorly but no specific complaints. Today between 12:30 and 1:00 pm, he had onset of left arm pain that reached an 8/10. It started with minimal exertion. It was his anginal symptom. There were no associated symptoms. He then developed substernal chest pain. He had SL NTG with him and took 2 of them without relief. He was at the [facility] in [place] and came to the [facility] ER. There, his ECG was consistent with an inferior STEMI. He was transferred emergently to [facility] and taken directly to the cath lab. On arrival to the cath lab, he is more comfortable but is still having chest pain.  Past Medical History  Diagnosis Date  . Diabetes mellitus, type II   . Hyperlipidemia     Lipid profile in 02/2012:135, 227, 41, 49  . Arteriosclerotic cardiovascular disease (ASCVD)     CABG x 4 1989, VG->OM1->OM2, VG->RCA, LIMA->LAD; 05/24/10 Cath/PCI:  VG->OM1->OM2 90 in graft (3.0x15 promus) & 90 in distal LCX (2.75x15 Promus);  NSTEMI in 04/2011 - TO distal LCX stent and VG->OM2.; 02/2012 NSTEMI Cath/PCI: 3VD, LIMA->LAD ok, VG->RCA 100, VG->OM1 99p (2.75x40mm Resolute DES), continuation to OM2 occluded.  . Hypertension   . Arteriosclerotic cardiovascular disease (ASCVD)   . Osteoarthritis   . CVA (cerebral infarction)   . Pneumonia   . Cervical vertebral fracture   . Chronic back pain   . Benign prostatic hypertrophy     Required urinary catheter x4 weeks for retention  . Peptic ulcer disease   . Gastroesophageal reflux disease   . Stroke      Surgical History:  Past Surgical  History  Procedure Date  . Coronary angioplasty with stent placement     2011, 02/2012  . Tonsillectomy   . Coronary artery bypass graft      I have reviewed the patient's current medications. Medication Sig  aspirin 325 MG tablet Take 325 mg by mouth daily. For pain  albuterol (PROAIR HFA) 108 (90 BASE) MCG/ACT inhaler Inhale 1-2 puffs into the lungs every 6 (six) hours as needed.  B Complex-Biotin-FA (B-COMPLEX PO) Take 1 tablet by mouth daily.   Bismuth Subsalicylate (PEPTO-BISMOL PO) Take by mouth daily as needed. Patient states that he just takes a couple of swallows when needed to control bowels.  calcium carbonate (TUMS - DOSED IN MG ELEMENTAL CALCIUM) 500 MG chewable tablet Chew 1 tablet by mouth 4 (four) times daily - after meals and at bedtime.  carvedilol (COREG) 6.25 MG tablet   Docusate Sodium  100 MG capsule Take 100 mg by mouth at bedtime. Constipation  furosemide (LASIX) 20 MG tablet   isosorbide mononitrate (IMDUR) 30 MG 24 hr tablet   metFORMIN (GLUCOPHAGE) 500 MG tablet Take 500 mg by mouth 2 (two) times daily.  Multiple Vitamin (MULTIVITAMIN WITH MINERALS) TABS Take 1 tablet by mouth daily.  nitrofurantoin (MACRODANTIN) 100 MG capsule Take 100 mg by mouth at bedtime.  nitroGLYCERIN (NITROSTAT) 0.4 MG SL tablet Place 1 tablet (0.4 mg total) under the tongue every 5 (five) minutes x 3 doses as needed for chest pain.  pantoprazole (PROTONIX) 40 MG tablet Take 40 mg by mouth every morning.  potassium chloride (K-DUR) 10 MEQ tablet   ramipril (ALTACE) 10 MG capsule Take 1 capsule (10 mg total) by mouth daily.  rosuvastatin (CRESTOR) 10 MG tablet Take 10 mg by mouth daily.  saxagliptin HCl (ONGLYZA) 5 MG TABS tablet Take 5 mg by mouth daily at 12 noon.   Tamsulosin HCl (FLOMAX) 0.4 MG CAPS Take 0.4 mg by mouth at bedtime. For urinary health  Ticagrelor (BRILINTA) 90 MG TABS tablet Take 1 tablet (90 mg total) by mouth 2 (two) times daily.    Scheduled Meds:    .  [COMPLETED] aspirin  324 mg Oral Once  . heparin      . [COMPLETED] heparin  4,000 Units Intravenous STAT  . nitroGLYCERIN       Continuous Infusions:    . sodium chloride 1,000 mL (06/18/12 1508)   PRN Meds:.  Allergies: No Known Allergies  History   Social History  . Marital Status: Divorced    Spouse Name: N/A    Number of Children: N/A  . Years of Education: N/A   Occupational History  . Retired     Probation officer   Social History Main Topics  . Smoking status: Former Smoker -- 2.0 packs/day for 10 years    Types: Cigarettes    Quit date: 07/14/1961  . Smokeless tobacco: Current User    Types: Chew  . Alcohol Use: No  . Drug Use: No  . Sexually Active: No   Other Topics Concern  . Not on file   Social History Narrative   ** Merged History Encounter ** ; He lives alone and still drives.     History reviewed. No pertinent family history. Family Status  Relation Status Death Age  . Mother Deceased   . Father Deceased   . Sister Alive     unsure    Review of Systems:   Full 14-point review of systems otherwise negative except as noted above.   Physical Exam: Blood pressure 93/50, pulse 109, temperature 98 F (36.7 C), temperature source Oral, resp. rate 18, height 5\' 10"  (1.778 m), weight 180 lb (81.647 kg), SpO2 98.00%. General: Well developed, well nourished, male in acute distress. Head: Normocephalic, atraumatic, sclera non-icteric, no xanthomas, nares are without discharge. Dentition: poor Neck: No carotid bruits. JVD not elevated. No thyromegally Lungs: Good expansion bilaterally. without wheezes or rhonchi. CTA bilaterally Heart: Regular rate and rhythm with S1 S2.  No S3 or S4.  No murmur, no rubs, or gallops appreciated. Abdomen: Soft, non-tender, non-distended with normoactive bowel sounds. No hepatomegaly. No rebound/guarding. No obvious abdominal masses. Msk:  Strength and tone appear normal for age. No joint deformities or effusions, no spine or  costo-vertebral angle tenderness. Extremities: No clubbing or cyanosis. No edema.  Distal pedal pulses are 2+ in 4 extrem Neuro: Alert and oriented X 3. Moves all extremities spontaneously. No focal deficits noted. Psych:  Responds to questions appropriately with a normal affect. Skin: No rashes or lesions noted  Labs:   Lab Results  Component Value Date   WBC 8.2 06/18/2012   HGB 11.7* 06/18/2012   HCT 34.9* 06/18/2012   MCV 90.4 06/18/2012   PLT 171 06/18/2012    Basename 06/18/12 1510  INR 0.89     Lab 06/18/12 1510  NA 136  K 4.0  CL 100  CO2 24  BUN 14  CREATININE 1.10  CALCIUM 9.5  PROT 6.4  BILITOT 0.2*  ALKPHOS 59  ALT 26  AST 29  GLUCOSE 209*     Basename 06/18/12 1516  TROPIPOC 1.10*    Radiology/Studies:  US Carotid Duplex Bilateral 06/14/2012  *RADIOLOGY REPORT*  Clinical Data: Bruit  BILATERAL CAROTID DUPLEX ULTRASOUND  Technique: Wallace Cullens scale imaging, color Doppler and duplex ultrasound was performed of bilateral carotid and vertebral arteries in the neck.  Comparison:  Carotid duplex ultrasound 04/08/2010  Criteria:  Quantification of carotid stenosis is based on velocity parameters that correlate the residual internal carotid diameter with NASCET-based stenosis levels, using the diameter of the distal internal carotid lumen as the denominator for stenosis measurement.  The following velocity measurements were obtained:                    PEAK SYSTOLIC/END DIASTOLIC  RIGHT ICA:                        93/15cm/sec  CCA:                                  90/9cm/sec  SYSTOLIC ICA/CCA RATIO:     1.0  DIASTOLIC ICA/CCA RATIO:    1.7  ECA:                        161cm/sec   LEFT ICA:                 67/18cm/sec  CCA:                        111/10cm/sec  SYSTOLIC ICA/CCA RATIO:     0.6  DIASTOLIC ICA/CCA RATIO:    1.8  ECA:                        122cm/sec   Findings:  RIGHT CAROTID ARTERY: Scattered areas of atheromatous plaque without significant stenosis by  gray scale, color Doppler or spectral Doppler evaluation. Pulsus bisferiens waveform noted in the common and internal carotid arteries.   RIGHT VERTEBRAL ARTERY:  Patent with normal antegrade flow   LEFT CAROTID ARTERY: Scattered areas of atherosclerotic plaque in the left carotid artery, particularly along the back wall without evidence of hemodynamically significant stenosis by gray scale, color Doppler or spectral Doppler imaging. Pulsus bisferiens waveform noted in the common and internal carotid arteries.   LEFT VERTEBRAL ARTERY:  Patent with normal antegrade flow   IMPRESSION:  1. Pulsus bisferiens waveform noted in the bilateral common and internal carotid arteries.  This wave form can be seen in patients with underlying aortic valvular disease, specifically with aortic regurgitation.  If clinically warranted further evaluation with echocardiography could confirm.  2.  Scattered atherosclerotic plaque in the bilateral carotid arteries without significant stenosis or interval progression compared to 04/08/2010.  3.  Patent bilateral vertebral arteries with normal antegrade flow.  Signed,  Sterling Big, MD Vascular & Interventional Radiologist [facility] Radiology   Original Report Authenticated By: Malachy Moan, M.D.      Cardiac Cath: 03/09/2012 Left mainstem: Heavily calcified, 30-50% distal left main stenosis.  Left anterior descending (LAD): The LAD is severely diseased. There is very tight ostial stenosis. The proximal vessel is totally occluded and there is a very large septal cascade that arises before the area of occlusion. These vessels supply collaterals to the distal right coronary artery  circulation appear  Left circumflex (LCx): The left circumflex is severely diseased in the proximal aspect. The mid vessel is totally occluded. The obtuse marginals are totally occluded.  Right coronary artery (RCA): The right coronary artery is totally occluded.  LIMA to LAD: The vessel is  widely patent. The distal anastomotic site is patent. There is no significant stenosis.  Saphenous vein graft to diagonal: Known chronic occlusion  Saphenous vein graft sequence to OM1 and OM 2: there is moderately tight 50-60% stenosis in the proximal graft just beyond the ostium. There is 95-99% stenosis at the distal edge of the stent in the proximal body of the graft. The remaining portions of graft are patent.  Left ventriculography: Diffuse left ventricular hypokinesis is present. There is akinesis of the basal and midinferior wall. The estimated left ventricular ejection fraction is 35-40%. There is mild mitral regurgitation. PCI Data:  Vessel - OM 1/Segment - proximal body of saphenous vein graft  Percent Stenosis (pre) 99  TIMI-flow 3  Stent 3.5 x 16 mm drug-eluting  Percent Stenosis (post) 0  TIMI-flow (post) 3 Final Conclusions:  1. Severe native three-vessel coronary artery disease  2. Status post coronary bypass surgery with continued patency of the LIMA to LAD, severe in-stent restenosis of the saphenous vein graft sequence to obtuse marginals, and known total occlusion of the saphenous vein graft to diagonal  3. Moderately severe segmental left ventricular systolic dysfunction  4. Successful PCI of the saphenous vein graft to obtuse marginal branch using distal embolic protection  Recommendations: Recommend continue dual antiplatelet therapy with aspirin and brilinta indefinitely as tolerated.  Echo: 03/22/2012 Study Conclusions - Left ventricle: The cavity size was normal. There was mild concentric hypertrophy. Systolic function was low normal. The estimated ejection fraction was in the range of 50% to 55%. Moderate to severe hypokinesis of the inferior myocardium. - Aortic valve: Mildly to moderately calcified annulus. Mildly thickened leaflets. Mild . Cusp separation was mildly reduced. Trivial, if any,stenosis. - Mitral valve: Moderately calcified annulus. - Atrial  septum: No defect or patent foramen ovale was identified. Impressions: - Compared to the prior study performed 10/21/11, probable inferior akinesis now noted.  ECG:  18-Jun-2012 14:50:00 [facility]-ED ROUTINE RECORD ** PID / NAME MISMATCH   Sinus tachycardia with occasional Premature ventricular complexes Inferior-posterior infarct , possibly acute ** ** ACUTE MI / STEMI ** ** Consider right ventricular involvement in acute inferior infarct Abnormal ECG 42mm/s 72mm/mV 150Hz  8.0.1 12SL 241 HD CID: 0 Referred by: Unconfirmed Vent. rate 105 BPM PR interval 194 ms QRS duration 98 ms QT/QTc 374/494 ms P-R-T axes 68 86 176  ASSESSMENT AND PLAN:  78 year old male with a history of CAD/CABG is admitted with infero/post STEMI and taken directly to the cath lab. Further evaluation and treatment will depend on the results. We will assess his cardiac risk factors and continue home meds except metformin.  Otherwise, continue home Rx. Principal Problem:  *ST elevation myocardial infarction (STEMI) of inferoposterior wall, initial episode of care Active Problems:  Hyperlipidemia  Dementia  Diabetes mellitus, type II  Hypertension   Signed,  Seleni Meller PA-C 06/18/2012, 4:20 PM

## 2012-06-18 NOTE — CV Procedure (Signed)
Cardiac Catheterization Procedure Note  Name: Jeffrey [name] MRN: [id number] DOB: 06/08/1929  Procedure: Selective Coronary Angiography, SVG angiography, LIMA angiography, PTCA/Stent of SVG to the ramus intermediate.  Indication: 78 year-old white male with remote coronary bypass surgery 25 years ago. He has had previous stenting of the saphenous vein graft to the ramus intermediate in 2011 again in August of 2013. The continuation of the same graft to the OM was previously stented but is known to be occluded. The saphenous vein graft to the RCA is also known to be occluded. He presents today with acute onset of left arm pain. ECG shows new 1 mm ST elevation in the inferior leads with ST depression in the anterior leads.   Diagnostic Procedure Details: The right groin was prepped, draped, and anesthetized with 1% lidocaine. Using the modified Seldinger technique, a 6 French sheath was introduced into the right femoral artery. Standard Judkins catheters were used for selective coronary angiography and left ventriculography. Catheter exchanges were performed over a wire.  The diagnostic procedure was well-tolerated without immediate complications.  PROCEDURAL FINDINGS Hemodynamics: AO 121/72 with a mean of 95 mmHg LV N/A  Coronary angiography: Coronary dominance: right  Left mainstem: The left main coronary is heavily calcified. It is diffusely narrowed up to 30-40%.  Left anterior descending (LAD): The LAD is also moderate to severely calcified in the proximal vessel. There is a 90% stenosis proximally. This is followed by diffuse disease in the mid vessel up to 50%. The distal LAD fills by the IMA graft. There are 2 small diagonal branches which are diffusely diseased.  The ramus intermediate branch is occluded.  Left circumflex (LCx): The left circumflex is occluded distally. The first obtuse marginal vessel is occluded.  Right coronary artery (RCA): The native right coronary is  severely diseased proximally up to 80%. In the mid vessel is occluded. There are right to right collaterals to a large right ventricular marginal branch. The distal right coronary supplied by extensive left-to-right collaterals.  The saphenous vein graft to the ramus intermediate branch has a 95% stenosis proximally within the site of a previous stent. The continuation of his vein graft to the OM is occluded.  Saphenous vein graft to the right coronary is occluded.  The LIMA graft to the LAD is widely patent.  Left ventriculography: Not performed.  PCI Procedure Note:  Following the diagnostic procedure, the decision was made to proceed with PCI of the saphenous vein graft to the intermediate. In 2011 this proximal graft was stented with a 3.0 mm Promus stent. In August 2013 a lesion just distal to the stent was stented with a 2.75 mm resolute stent in an overlapping fashion. The current lesion involves the very proximal stented site. There is also disease extending to the ostium of the vein graft. Weight-based bivalirudin was given for anticoagulation. Once a therapeutic ACT was achieved, a 6 [place] FR4 guide with sideholes catheter was inserted.  A pro-water coronary guidewire was used to cross the lesion.  The lesion was predilated with a 2.5 mm and then a 3.0 mm balloon.  The lesion was then stented with a 3.0 x 12 mm Promus premier stent extending to the ostium.  The stent was postdilated with a 3.0 mm noncompliant balloon.  Following PCI, there was 0% residual stenosis and TIMI-3 flow. Final angiography confirmed an excellent result. Femoral hemostasis was achieved with manual compression.  The patient tolerated the PCI procedure well. There were no immediate procedural complications.  The  patient was transferred to the post catheterization recovery area for further monitoring.  PCI Data: Vessel - saphenous vein graft to the intermediate/Segment - proximal Percent Stenosis (pre)  95% TIMI-flow  2 Stent 3.0 x 12 mm Promus premier Percent Stenosis (post) 0% TIMI-flow (post) 3  Final Conclusions:   1. Severe three-vessel obstructive coronary disease. 2. 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 Patent LIMA graft to the LAD 4. Critical stenosis in the saphenous vein graft to the ramus intermediate branch 5. Successful stenting of the saphenous vein graft to the ramus intermediate branch with a drug-eluting stent  Recommendations: Continue dual antiplatelet therapy indefinitely.  Theron Arista [facility] 06/18/2012, 4:37 PM

## 2012-06-18 NOTE — ED Provider Notes (Signed)
History   This chart was scribed for [name], MD by [name], ED Scribe. The patient was seen in room [name]. Patient's care was started at 1455.   CSN: [id number]  Arrival date & time 06/18/12  1432   First MD Initiated Contact with Patient 06/18/12 1455      Chief Complaint  Patient presents with  . Arm Pain   Jeffrey Jeffrey [name] is a 78 y.o. male who presents to the Emergency Department complaining of sudden onset severe left arm pain that started around noon today. He states that he was walking when the pain started. He states that he took 2 nitroglycerin PTA. He rates his pain 8/10 prior to nitro and 1/10 currently. He reports he has been feeling generally weak for the past few days. He reports associated increased SOB today. He denies any nausea, vomiting, diaphoresis. He has had 6 stents placed since his bypass surgery was done. He reports he has had 2 adult aspirin today one this am and ? possibly one around noon or lunch time.   PCP [name] Cardiologist: Dr. Dietrich Pates  Patient is a 78 y.o. male presenting with extremity pain. The history is provided by the patient. No language interpreter was used.  Extremity Pain This is a new problem. The current episode started 1 to 2 hours ago. The problem occurs constantly. The problem has been gradually improving. Associated symptoms include shortness of breath. Nothing aggravates the symptoms. Nothing relieves the symptoms. Treatments tried: Nitroglycerin. The treatment provided moderate relief.    Past Medical History  Diagnosis Date  . Diabetes mellitus, type II   . Hyperlipidemia     Lipid profile in 02/2012:135, 227, 41, 49  . Arteriosclerotic cardiovascular disease (ASCVD)     CABG x 4 1989, VG->OM1->OM2, VG->RCA, LIMA->LAD; 05/24/10 Cath/PCI:  VG->OM1->OM2 90 in graft (3.0x15 promus) & 90 in distal LCX (2.75x15 Promus);  NSTEMI in 04/2011 - TO distal LCX stent and VG->OM2.; 02/2012 NSTEMI Cath/PCI: 3VD, LIMA->LAD  ok, VG->RCA 100, VG->OM1 99p (2.75x76mm Resolute DES), continuation to OM2 occluded.  . Hypertension   . Arteriosclerotic cardiovascular disease (ASCVD)   . Osteoarthritis   . CVA (cerebral infarction)   . Pneumonia   . Cervical vertebral fracture   . Chronic back pain   . Benign prostatic hypertrophy     Required urinary catheter x4 weeks for retention  . Peptic ulcer disease   . Gastroesophageal reflux disease   . Stroke     Past Surgical History  Procedure Date  . Coronary angioplasty with stent placement     2011, 02/2012  . Tonsillectomy   . Coronary artery bypass graft     History reviewed. No pertinent family history.  History  Substance Use Topics  . Smoking status: Former Smoker -- 2.0 packs/day for 10 years    Types: Cigarettes    Quit date: 07/14/1961  . Smokeless tobacco: Current User    Types: Chew  . Alcohol Use: No   He lives alone.  Lives at home   Review of Systems  Constitutional: Negative for diaphoresis.  Respiratory: Positive for shortness of breath.   Gastrointestinal: Negative for nausea and vomiting.  Musculoskeletal: Positive for myalgias.       Left arm pain.  Neurological: Positive for weakness.  All other systems reviewed and are negative.    Allergies  Review of patient's allergies indicates no known allergies.  Home Medications   Current Outpatient Rx  Name  Route  Sig  Dispense  Refill  . ALBUTEROL SULFATE HFA 108 (90 BASE) MCG/ACT IN AERS   Inhalation   Inhale 1-2 puffs into the lungs every 6 (six) hours as needed.   1 Inhaler   6   . ASPIRIN 325 MG PO TABS   Oral   Take 325 mg by mouth daily. For pain         . B-COMPLEX PO   Oral   Take 1 tablet by mouth daily.          Marland Kitchen PEPTO-BISMOL PO   Oral   Take by mouth daily as needed. Patient states that he just takes a couple of swallows when needed to control bowels.         Marland Kitchen CALCIUM CARBONATE ANTACID 500 MG PO CHEW   Oral   Chew 1 tablet by mouth 4 (four)  times daily - after meals and at bedtime.         Marland Kitchen DOCUSATE SODIUM 100 MG PO TABS   Oral   Take 100 mg by mouth at bedtime. Constipation         . METFORMIN HCL 500 MG PO TABS   Oral   Take 500 mg by mouth 2 (two) times daily.         . ADULT MULTIVITAMIN W/MINERALS CH   Oral   Take 1 tablet by mouth daily.         Marland Kitchen NITROFURANTOIN MACROCRYSTAL 100 MG PO CAPS   Oral   Take 100 mg by mouth at bedtime.         Marland Kitchen NITROGLYCERIN 0.4 MG SL SUBL   Sublingual   Place 1 tablet (0.4 mg total) under the tongue every 5 (five) minutes x 3 doses as needed for chest pain.   25 tablet   3   . PANTOPRAZOLE SODIUM 40 MG PO TBEC   Oral   Take 40 mg by mouth every morning.         Marland Kitchen RAMIPRIL 10 MG PO CAPS   Oral   Take 1 capsule (10 mg total) by mouth daily.   30 capsule   6   . ROSUVASTATIN CALCIUM 10 MG PO TABS   Oral   Take 10 mg by mouth daily.         Marland Kitchen SAXAGLIPTIN HCL 5 MG PO TABS   Oral   Take 5 mg by mouth daily at 12 noon.          Marland Kitchen TAMSULOSIN HCL 0.4 MG PO CAPS   Oral   Take 0.4 mg by mouth at bedtime. For urinary health         . TICAGRELOR 90 MG PO TABS   Oral   Take 1 tablet (90 mg total) by mouth 2 (two) times daily.   60 tablet   6     ED Triage Vitals  Enc Vitals Group     BP 06/18/12 1443 93/50 mmHg     Pulse Rate 06/18/12 1443 109      Resp 06/18/12 1443 18      Temp 06/18/12 1443 98 F (36.7 C)     Temp src 06/18/12 1443 Oral     SpO2 06/18/12 1443 98 %     Weight 06/18/12 1443 180 lb (81.647 kg)     Height 06/18/12 1443 5\' 10"  (1.778 m)     Head Cir --      Peak Flow --      Pain Score 06/18/12 1440 Eight  Pain Loc --      Pain Edu? --      Excl. in GC? --    Vital signs normal except hypotension   Physical Exam  Nursing note and vitals reviewed. Constitutional: He is oriented to person, place, and time. He appears well-developed and well-nourished. No distress.  HENT:  Head: Normocephalic and atraumatic.  Right  Ear: External ear normal.  Left Ear: External ear normal.  Nose: Nose normal.  Mouth/Throat: Oropharynx is clear and moist.  Eyes: Conjunctivae normal and EOM are normal. Pupils are equal, round, and reactive to light.  Neck: Normal range of motion. Neck supple. No tracheal deviation present.  Cardiovascular: Normal rate, regular rhythm and normal heart sounds.   No murmur heard. Pulmonary/Chest: Effort normal and breath sounds normal. No respiratory distress. He has no wheezes. He has no rales.  Abdominal: Soft. Bowel sounds are normal. He exhibits no distension. There is no tenderness.  Musculoskeletal: Normal range of motion. He exhibits no edema and no tenderness.  Neurological: He is alert and oriented to person, place, and time.  Skin: Skin is warm and dry.  Psychiatric: His behavior is normal.       Flat affect    ED Course  Procedures (including critical care time)   Medications  nitroGLYCERIN 0.2 mg/mL in dextrose 5 % infusion (not administered)  0.9 %  sodium chloride infusion (not administered)  aspirin chewable tablet 324 mg (not administered)  heparin injection 4,000 Units (not administered)  heparin 100-0.45 UNIT/ML-% infusion (not administered)     DIAGNOSTIC STUDIES: Oxygen Saturation is 98% on room air, normal by my interpretation.    COORDINATION OF CARE:  15:01-Discussed planned course of treatment with the patient, who is agreeable at this time. Called code stemi. Pt will be transferred to [facility].   15:12 Discussed with dispatcher at [facility] there is a unit in the ED and will be diverted to this patient.   [facility] Dispatcher states Dr [place] is in an acute case but has accepted this patient in transfer to [facility] cath lab.  15:14 Pt left the ED with [facility].     Results for orders placed during the hospital encounter of 06/18/12  APTT      Component Value Range   aPTT 28  24 - 37 seconds  CBC      Component Value Range   WBC 8.2  4.0 - 10.5 K/uL    RBC 3.86 (*) 4.22 - 5.81 MIL/uL   Hemoglobin 11.7 (*) 13.0 - 17.0 g/dL   HCT 30.8 (*) 65.7 - 84.6 %   MCV 90.4  78.0 - 100.0 fL   MCH 30.3  26.0 - 34.0 pg   MCHC 33.5  30.0 - 36.0 g/dL   RDW 96.2  95.2 - 84.1 %   Platelets 171  150 - 400 K/uL  PROTIME-INR      Component Value Range   Prothrombin Time 12.0  11.6 - 15.2 seconds   INR 0.89  0.00 - 1.49  POCT I-STAT TROPONIN I      Component Value Range   Troponin i, poc 1.10 (*) 0.00 - 0.08 ng/mL   Comment NOTIFIED PHYSICIAN     Comment 3            Laboratory interpretation all normal except mild anemia      Date: 06/18/2012  Rate: 105  Rhythm: sinus tachycardia and premature ventricular contractions (PVC)  QRS Axis: normal  Intervals: normal  ST/T Wave abnormalities:  ST elevations inferiorly, ST depressions anteriorly and ST depressions laterally  Conduction Disutrbances:none  Narrative Interpretation:   Old EKG Reviewed: changes noted from 05/17/2012 ST changes new    1. STEMI (ST elevation myocardial infarction)     CRITICAL CARE Performed by: Devoria Albe L   Total critical care time: 30 min   Critical care time was exclusive of separately billable procedures and treating other patients.  Critical care was necessary to treat or prevent imminent or life-threatening deterioration.  Critical care was time spent personally by me on the following activities: development of treatment plan with patient and/or surrogate as well as nursing, discussions with consultants, evaluation of patient's response to treatment, examination of patient, obtaining history from patient or surrogate, ordering and performing treatments and interventions, ordering and review of laboratory studies, ordering and review of radiographic studies, pulse oximetry and re-evaluation of patient's condition.   MDM  I personally performed the services described in this documentation, which was scribed in my presence. The recorded information has been  reviewed and considered.  Devoria Albe, MD, Armando Gang       [name], MD 06/18/12 2121

## 2012-06-18 NOTE — ED Notes (Signed)
Patient reports taking an "adult aspirin this morning." Dr Lynelle Doctor aware, order given to give the aspirin.

## 2012-06-18 NOTE — ED Notes (Signed)
[facility] here taking patient. Patient unable to sign consent to transfer at this time.

## 2012-06-18 NOTE — ED Notes (Addendum)
Weak for 2-3 days, Lt arm pain today, onset 12 n.  NO nvd,  Sl sob. Alert,  Took 2 ntg pta

## 2012-06-18 NOTE — H&P (Signed)
Patient seen and examined and history reviewed. Agree with above findings and plan. Patient presents with acute STEMI. Known remote CABG Prior stent to SVG to RI in 2011 and 2013. This is the likely culprit. Will proceed with emergent cardiac cath/PCI.  Theron Arista [facility] 06/18/2012 4:52 PM

## 2012-06-19 DIAGNOSIS — I219 Acute myocardial infarction, unspecified: Secondary | ICD-10-CM

## 2012-06-19 DIAGNOSIS — I517 Cardiomegaly: Secondary | ICD-10-CM

## 2012-06-19 LAB — CBC
MCH: 29.1 pg (ref 26.0–34.0)
Platelets: 163 10*3/uL (ref 150–400)
RBC: 3.99 MIL/uL — ABNORMAL LOW (ref 4.22–5.81)
WBC: 8.2 10*3/uL (ref 4.0–10.5)

## 2012-06-19 LAB — LIPID PANEL
LDL Cholesterol: 40 mg/dL (ref 0–99)
VLDL: 33 mg/dL (ref 0–40)

## 2012-06-19 LAB — COMPREHENSIVE METABOLIC PANEL
ALT: 22 U/L (ref 0–53)
AST: 35 U/L (ref 0–37)
Albumin: 3.5 g/dL (ref 3.5–5.2)
Calcium: 9.1 mg/dL (ref 8.4–10.5)
GFR calc Af Amer: 89 mL/min — ABNORMAL LOW (ref >90)
Sodium: 137 mEq/L (ref 135–145)
Total Protein: 6.4 g/dL (ref 6.0–8.3)

## 2012-06-19 LAB — GLUCOSE, CAPILLARY

## 2012-06-19 LAB — TROPONIN I: Troponin I: 7.38 ng/mL (ref <0.30)

## 2012-06-19 MED ORDER — ASPIRIN 81 MG PO CHEW
81.0000 mg | CHEWABLE_TABLET | Freq: Every day | ORAL | Status: DC
  Administered 2012-06-19 – 2012-06-21 (×3): 81 mg via ORAL
  Filled 2012-06-19 (×3): qty 1

## 2012-06-19 NOTE — Progress Notes (Signed)
CARDIAC REHAB PHASE I   PRE:  Rate/Rhythm: 82 sinus  BP:  Sitting: 92/57     SaO2: 97 RA  MODE:  Ambulation: 350 ft   POST:  Rate/Rhythem: 78  BP:  Machine malfunction and no cuff in room   SaO2: 100 RA  Order received and appreciated.  Chart reviewed.  Pt ambulated 350 ft with assist x1 using rolling walker.  Pt had no c/o during walk but was fatigued upon return.  Reviewed MI booklet, diet, restrictions, stent/Birlinta, and when to call 911/MD.  Pt refused Cardiac Rehab Phase II due to exercise intolerance due to pain in back and neck.  Pt voiced understanding. Fabio Pierce, MA, ACSM RCEP [phone]  Hazle Nordmann

## 2012-06-19 NOTE — Progress Notes (Signed)
Patient ID: Jeffrey [name], male   DOB: 01/08/1929, 78 y.o.   MRN: [id number]    SUBJECTIVE: No complaints this morning, no chest pain.  BP was a bit low last night but is fine this morning.  No lightheadedness.       . [COMPLETED] aspirin  324 mg Oral Once  . aspirin  81 mg Oral Daily  . atorvastatin  80 mg Oral q1800  . B-complex with vitamin C  1 tablet Oral Daily  . calcium carbonate  1 tablet Oral TID PC & HS  . carvedilol  6.25 mg Oral BID WC  . Docusate Sodium  100 mg Oral QHS  . furosemide  20 mg Oral Daily  . [EXPIRED] heparin      . [COMPLETED] heparin  4,000 Units Intravenous STAT  . insulin aspart  0-15 Units Subcutaneous TID WC  . insulin aspart  0-5 Units Subcutaneous QHS  . linagliptin  5 mg Oral Daily  . multivitamin with minerals  1 tablet Oral Daily  . nitrofurantoin  100 mg Oral QHS  . [EXPIRED] nitroGLYCERIN      . pantoprazole  40 mg Oral Daily  . potassium chloride  10 mEq Oral Daily  . ramipril  10 mg Oral Daily  . Tamsulosin HCl  0.4 mg Oral QHS  . Ticagrelor  90 mg Oral BID  . [DISCONTINUED] aspirin EC  81 mg Oral Daily  . [DISCONTINUED] atorvastatin  20 mg Oral q1800  . [DISCONTINUED] B-Complex  1 tablet Oral Daily  . [DISCONTINUED] sodium chloride  3 mL Intravenous Q12H      Filed Vitals:   06/19/12 0300 06/19/12 0400 06/19/12 0500 06/19/12 0600  BP: 102/57 97/57 102/58 88/52  Pulse: 78 82  81  Temp:  98 F (36.7 C)    TempSrc:  Oral    Resp: 19 19 18 20   Height:      Weight:   176 lb 5.9 oz (80 kg)   SpO2:  100%      Intake/Output Summary (Last 24 hours) at 06/19/12 0859 Last data filed at 06/19/12 0500  Gross per 24 hour  Intake   1158 ml  Output   3450 ml  Net  -2292 ml    LABS: Basic Metabolic Panel:  Basename 06/19/12 0545 06/18/12 1510  NA 137 136  K 3.7 4.0  CL 101 100  CO2 24 24  GLUCOSE 191* 209*  BUN 11 14  CREATININE 0.92 1.10  CALCIUM 9.1 9.5  MG -- --  PHOS -- --   Liver Function Tests:  [facility]  06/19/12 0545 06/18/12 1510  AST 35 29  ALT 22 26  ALKPHOS 65 59  BILITOT 0.4 0.2*  PROT 6.4 6.4  ALBUMIN 3.5 3.6   No results found for this basename: LIPASE:2,AMYLASE:2 in the last 72 hours CBC:  Basename 06/19/12 0545 06/18/12 1510  WBC 8.2 8.2  NEUTROABS -- --  HGB 11.6* 11.7*  HCT 35.6* 34.9*  MCV 89.2 90.4  PLT 163 171   Cardiac Enzymes:  Basename 06/19/12 0545 06/18/12 2352 06/18/12 1741  CKTOTAL -- -- --  CKMB -- -- --  CKMBINDEX -- -- --  TROPONINI 7.38* 5.89* 4.01*   BNP: No components found with this basename: POCBNP:3 D-Dimer: No results found for this basename: DDIMER:2 in the last 72 hours Hemoglobin A1C:  Basename 06/18/12 1741  HGBA1C 8.0*   Fasting Lipid Panel:  Basename 06/19/12 0545  CHOL 113  HDL 40  LDLCALC 40  TRIG 165*  CHOLHDL 2.8  LDLDIRECT --   Thyroid Function Tests: No results found for this basename: TSH,T4TOTAL,FREET3,T3FREE,THYROIDAB in the last 72 hours Anemia Panel: No results found for this basename: VITAMINB12,FOLATE,FERRITIN,TIBC,IRON,RETICCTPCT in the last 72 hours  RADIOLOGY: US Carotid Duplex Bilateral  06/14/2012  *RADIOLOGY REPORT*  Clinical Data: Bruit  BILATERAL CAROTID DUPLEX ULTRASOUND  Technique: Wallace Cullens scale imaging, color Doppler and duplex ultrasound was performed of bilateral carotid and vertebral arteries in the neck.  Comparison:  Carotid duplex ultrasound 04/08/2010  Criteria:  Quantification of carotid stenosis is based on velocity parameters that correlate the residual internal carotid diameter with NASCET-based stenosis levels, using the diameter of the distal internal carotid lumen as the denominator for stenosis measurement.  The following velocity measurements were obtained:                   PEAK SYSTOLIC/END DIASTOLIC RIGHT ICA:                        93/15cm/sec CCA:                        90/9cm/sec SYSTOLIC ICA/CCA RATIO:     1.0 DIASTOLIC ICA/CCA RATIO:    1.7 ECA:                        161cm/sec   LEFT ICA:                        67/18cm/sec CCA:                        111/10cm/sec SYSTOLIC ICA/CCA RATIO:     0.6 DIASTOLIC ICA/CCA RATIO:    1.8 ECA:                        122cm/sec  Findings:  RIGHT CAROTID ARTERY: Scattered areas of atheromatous plaque without significant stenosis by gray scale, color Doppler or spectral Doppler evaluation. Pulsus bisferiens waveform noted in the common and internal carotid arteries.  RIGHT VERTEBRAL ARTERY:  Patent with normal antegrade flow  LEFT CAROTID ARTERY: Scattered areas of atherosclerotic plaque in the left carotid artery, particularly along the back wall without evidence of hemodynamically significant stenosis by gray scale, color Doppler or spectral Doppler imaging. Pulsus bisferiens waveform noted in the common and internal carotid arteries.  LEFT VERTEBRAL ARTERY:  Patent with normal antegrade flow  IMPRESSION:  1. Pulsus bisferiens waveform noted in the bilateral common and internal carotid arteries.  This wave form can be seen in patients with underlying aortic valvular disease, specifically with aortic regurgitation.  If clinically warranted further evaluation with echocardiography could confirm.  2.  Scattered atherosclerotic plaque in the bilateral carotid arteries without significant stenosis or interval progression compared to 04/08/2010.  3.  Patent bilateral vertebral arteries with normal antegrade flow.  Signed,  Sterling Big, MD Vascular & Interventional Radiologist [facility]   Original Report Authenticated By: Malachy Moan, M.D.    Portable Chest X-ray 1 View  06/18/2012  *RADIOLOGY REPORT*  Clinical Data: Myocardial infarction.  PORTABLE CHEST - 1 VIEW  Comparison: 05/17/2012.  Findings: The cardiac silhouette remains borderline enlarged. Stable post CABG changes. Small amount of linear density at the left lung base.  Otherwise, clear lungs with normal vascularity. Thoracic spine degenerative changes.  IMPRESSION: Minimal  left basilar atelectasis.  Original Report Authenticated By: Beckie Salts, M.D.     PHYSICAL EXAM General: NAD Neck: No JVD, no thyromegaly or thyroid nodule.  Lungs: Clear to auscultation bilaterally with normal respiratory effort. CV: Nondisplaced PMI.  Heart regular S1/S2, no S3/S4, no murmur.  No peripheral edema.  No carotid bruit.   Abdomen: Soft, nontender, no hepatosplenomegaly, no distention.  Neurologic: Alert and oriented x 3.  Psych: Normal affect. Extremities: No clubbing or cyanosis.   TELEMETRY: Reviewed telemetry pt in NSR  ASSESSMENT AND PLAN:  78 yo with history of CAD s/p CABG, DM2, prior CVA presented with inferior MI, now s/p DES to SVG-ramus.   1. CAD: Inferior MI on ECG with DES to SVG-ramus yesterday.  This stent was placed just proximal to and overlapping with the a prior stent in the same graft. No chest pain.  - Continue ASA 81, Brilinta, atorvastatin 80.  - BP improved this morning, may continue his Coreg and ramipril. - Echo to be done today.  2. Chronic diastolic CHF: Continue home Lasix dosing. Echo 50-55% on last echo, repeating today.  3. Diabetes: Holding metformin with recent cath.  Covering with sliding scale insulin + saxagliptin.  4. May go to telemetry floor.   Marca Ancona 06/19/2012 9:04 AM

## 2012-06-19 NOTE — Progress Notes (Signed)
  Echocardiogram 2D Echocardiogram has been performed.  Jeffrey [name], Jeffrey [name] 06/19/2012, 8:50 AM

## 2012-06-20 DIAGNOSIS — I679 Cerebrovascular disease, unspecified: Secondary | ICD-10-CM

## 2012-06-20 DIAGNOSIS — I10 Essential (primary) hypertension: Secondary | ICD-10-CM

## 2012-06-20 DIAGNOSIS — I7090 Unspecified atherosclerosis: Secondary | ICD-10-CM

## 2012-06-20 LAB — GLUCOSE, CAPILLARY
Glucose-Capillary: 184 mg/dL — ABNORMAL HIGH (ref 70–99)
Glucose-Capillary: 187 mg/dL — ABNORMAL HIGH (ref 70–99)
Glucose-Capillary: 194 mg/dL — ABNORMAL HIGH (ref 70–99)
Glucose-Capillary: 283 mg/dL — ABNORMAL HIGH (ref 70–99)

## 2012-06-20 LAB — BASIC METABOLIC PANEL
BUN: 13 mg/dL (ref 6–23)
GFR calc non Af Amer: 66 mL/min — ABNORMAL LOW (ref >90)
Glucose, Bld: 211 mg/dL — ABNORMAL HIGH (ref 70–99)
Potassium: 3.9 mEq/L (ref 3.5–5.1)

## 2012-06-20 LAB — CBC
Hemoglobin: 11.4 g/dL — ABNORMAL LOW (ref 13.0–17.0)
MCH: 29.3 pg (ref 26.0–34.0)
MCHC: 32.9 g/dL (ref 30.0–36.0)
RDW: 14.1 % (ref 11.5–15.5)

## 2012-06-20 MED ORDER — DOCUSATE SODIUM 100 MG PO CAPS
100.0000 mg | ORAL_CAPSULE | Freq: Every day | ORAL | Status: DC
  Administered 2012-06-20: 100 mg via ORAL
  Filled 2012-06-20 (×2): qty 1

## 2012-06-20 NOTE — Plan of Care (Signed)
Problem: Phase III Progression Outcomes Goal: Up to chair & ambulate with assist (TID) Outcome: Completed/Met Date Met:  06/20/12 Ambulating in hallway without assist Goal: Vascular site scale level 0 - I Vascular Site Scale Level 0: No bruising/bleeding/hematoma Level I (Mild): Bruising/Ecchymosis, minimal bleeding/ooozing, palpable hematoma < 3 cm Level II (Moderate): Bleeding not affecting hemodynamic parameters, pseudoaneurysm, palpable hematoma > 3 cm  Outcome: Completed/Met Date Met:  06/20/12 R groin level (0)

## 2012-06-20 NOTE — Progress Notes (Signed)
Patient ID: Jeffrey [name], male   DOB: [birth year], 78 y.o.   MRN: [id number]    SUBJECTIVE: No complaints this morning, no chest pain.   No lightheadedness.  Ambulating in hall.     Marland Kitchen aspirin  81 mg Oral Daily  . atorvastatin  80 mg Oral q1800  . B-complex with vitamin C  1 tablet Oral Daily  . calcium carbonate  1 tablet Oral TID PC & HS  . carvedilol  6.25 mg Oral BID WC  . Docusate Sodium  100 mg Oral QHS  . furosemide  20 mg Oral Daily  . insulin aspart  0-15 Units Subcutaneous TID WC  . insulin aspart  0-5 Units Subcutaneous QHS  . linagliptin  5 mg Oral Daily  . multivitamin with minerals  1 tablet Oral Daily  . nitrofurantoin  100 mg Oral QHS  . pantoprazole  40 mg Oral Daily  . potassium chloride  10 mEq Oral Daily  . ramipril  10 mg Oral Daily  . Tamsulosin HCl  0.4 mg Oral QHS  . Ticagrelor  90 mg Oral BID      Filed Vitals:   06/19/12 1400 06/19/12 2100 06/20/12 0500 06/20/12 1004  BP: 113/65 109/66 107/62 115/59  Pulse: 75 73 74   Temp: 98.4 F (36.9 C) 98.5 F (36.9 C) 99.1 F (37.3 C)   TempSrc: Oral     Resp: 18 18 18    Height:      Weight:   82.01 kg (180 lb 12.8 oz)   SpO2: 96% 97% 94%     Intake/Output Summary (Last 24 hours) at 06/20/12 1119 Last data filed at 06/19/12 1300  Gross per 24 hour  Intake    240 ml  Output    300 ml  Net    -60 ml    LABS: Basic Metabolic Panel:  Basename 06/20/12 0600 06/19/12 0545  NA 138 137  K 3.9 3.7  CL 103 101  CO2 26 24  GLUCOSE 211* 191*  BUN 13 11  CREATININE 1.03 0.92  CALCIUM 9.2 9.1  MG -- --  PHOS -- --   Liver Function Tests:  [facility] 06/19/12 0545 06/18/12 1510  AST 35 29  ALT 22 26  ALKPHOS 65 59  BILITOT 0.4 0.2*  PROT 6.4 6.4  ALBUMIN 3.5 3.6   No results found for this basename: LIPASE:2,AMYLASE:2 in the last 72 hours CBC:  Basename 06/20/12 0600 06/19/12 0545  WBC 6.5 8.2  NEUTROABS -- --  HGB 11.4* 11.6*  HCT 34.7* 35.6*  MCV 89.2 89.2  PLT 151 163   Cardiac  Enzymes:  Basename 06/19/12 0545 06/18/12 2352 06/18/12 1741  CKTOTAL -- -- --  CKMB -- -- --  CKMBINDEX -- -- --  TROPONINI 7.38* 5.89* 4.01*   BNP: No components found with this basename: POCBNP:3 D-Dimer: No results found for this basename: DDIMER:2 in the last 72 hours Hemoglobin A1C:  Basename 06/18/12 1741  HGBA1C 8.0*   Fasting Lipid Panel:  Basename 06/19/12 0545  CHOL 113  HDL 40  LDLCALC 40  TRIG 165*  CHOLHDL 2.8  LDLDIRECT --   Thyroid Function Tests: No results found for this basename: TSH,T4TOTAL,FREET3,T3FREE,THYROIDAB in the last 72 hours Anemia Panel: No results found for this basename: VITAMINB12,FOLATE,FERRITIN,TIBC,IRON,RETICCTPCT in the last 72 hours  RADIOLOGY: US Carotid Duplex Bilateral  06/14/2012  *RADIOLOGY REPORT*  Clinical Data: Bruit  BILATERAL CAROTID DUPLEX ULTRASOUND  Technique: Wallace Cullens scale imaging, color Doppler and duplex ultrasound was performed  of bilateral carotid and vertebral arteries in the neck.  Comparison:  Carotid duplex ultrasound 04/08/2010  Criteria:  Quantification of carotid stenosis is based on velocity parameters that correlate the residual internal carotid diameter with NASCET-based stenosis levels, using the diameter of the distal internal carotid lumen as the denominator for stenosis measurement.  The following velocity measurements were obtained:                   PEAK SYSTOLIC/END DIASTOLIC RIGHT ICA:                        93/15cm/sec CCA:                        90/9cm/sec SYSTOLIC ICA/CCA RATIO:     1.0 DIASTOLIC ICA/CCA RATIO:    1.7 ECA:                        161cm/sec  LEFT ICA:                        67/18cm/sec CCA:                        111/10cm/sec SYSTOLIC ICA/CCA RATIO:     0.6 DIASTOLIC ICA/CCA RATIO:    1.8 ECA:                        122cm/sec  Findings:  RIGHT CAROTID ARTERY: Scattered areas of atheromatous plaque without significant stenosis by gray scale, color Doppler or spectral Doppler evaluation. Pulsus  bisferiens waveform noted in the common and internal carotid arteries.  RIGHT VERTEBRAL ARTERY:  Patent with normal antegrade flow  LEFT CAROTID ARTERY: Scattered areas of atherosclerotic plaque in the left carotid artery, particularly along the back wall without evidence of hemodynamically significant stenosis by gray scale, color Doppler or spectral Doppler imaging. Pulsus bisferiens waveform noted in the common and internal carotid arteries.  LEFT VERTEBRAL ARTERY:  Patent with normal antegrade flow  IMPRESSION:  1. Pulsus bisferiens waveform noted in the bilateral common and internal carotid arteries.  This wave form can be seen in patients with underlying aortic valvular disease, specifically with aortic regurgitation.  If clinically warranted further evaluation with echocardiography could confirm.  2.  Scattered atherosclerotic plaque in the bilateral carotid arteries without significant stenosis or interval progression compared to 04/08/2010.  3.  Patent bilateral vertebral arteries with normal antegrade flow.  Signed,  Sterling Big, MD Vascular & Interventional Radiologist [facility]iology   Original Report Authenticated By: Malachy Moan, M.D.    Portable Chest X-ray 1 View  06/18/2012  *RADIOLOGY REPORT*  Clinical Data: Myocardial infarction.  PORTABLE CHEST - 1 VIEW  Comparison: 05/17/2012.  Findings: The cardiac silhouette remains borderline enlarged. Stable post CABG changes. Small amount of linear density at the left lung base.  Otherwise, clear lungs with normal vascularity. Thoracic spine degenerative changes.  IMPRESSION: Minimal left basilar atelectasis.   Original Report Authenticated By: Beckie Salts, M.D.    ECHO:Transthoracic Echocardiography  Patient: Jeffrey [name], Jeffrey [name] MR #: [id number] Study Date: 06/19/2012 Gender: M Age: 37 Height: 177.8cm Weight: 79.8kg BSA: 1.25m^2 Pt. Status: Room: MCCL  ORDERING [place], Peter PERFORMING [place], [facility] ATTENDING [place],  [place] L SONOGRAPHER Georgian Co, RDCS, CCT cc:  ------------------------------------------------------------ LV EF: 40%  ------------------------------------------------------------ Indications: MI - acute 410.91.  ------------------------------------------------------------ History:  Risk factors: Hypertension. Diabetes mellitus. Dyslipidemia.  ------------------------------------------------------------ Study Conclusions  - Left ventricle: Severe hypokinesis of inferior wall and inferolateral wall. The cavity size was moderately dilated. The estimated ejection fraction was 40%. Doppler parameters are consistent with abnormal left ventricular relaxation (grade 1 diastolic dysfunction). - Aortic valve: Sclerosis without stenosis. - Mitral valve: Calcified annulus. - Left atrium: The atrium was moderately dilated. - Right ventricle: Systolic function was mildly reduced. - Right atrium: The atrium was mildly dilated.  ------------------------------------------------------------ Labs, prior tests, procedures, and surgery: Coronary artery bypass grafting.  Transthoracic echocardiography. M-mode, complete 2D, spectral Doppler, and color Doppler. Height: Height: 177.8cm. Height: 70in. Weight: Weight: 79.8kg. Weight: 175.6lb. Body mass index: BMI: 25.3kg/m^2. Body surface area: BSA: 1.14m^2. Blood pressure: 88/52. Patient status: Inpatient. Location: ICU/CCU  ------------------------------------------------------------  ------------------------------------------------------------ Left ventricle: Severe hypokinesis of inferior wall and inferolateral wall. The cavity size was moderately dilated. The estimated ejection fraction was 40%. Doppler parameters are consistent with abnormal left ventricular relaxation (grade 1 diastolic dysfunction).  ------------------------------------------------------------ Aortic valve: Sclerosis without stenosis. Doppler: No significant  regurgitation.  ------------------------------------------------------------ Aorta: Aortic root: The aortic root was normal in size.  ------------------------------------------------------------ Mitral valve: Calcified annulus. Doppler: No significant regurgitation. Peak gradient: 4mm Hg (D).  ------------------------------------------------------------ Left atrium: The atrium was moderately dilated.  ------------------------------------------------------------ Right ventricle: The cavity size was normal. Systolic function was mildly reduced.  ------------------------------------------------------------ Pulmonic valve: The valve appears to be grossly normal. Doppler: No significant regurgitation.  ------------------------------------------------------------ Tricuspid valve: Structurally normal valve. Leaflet separation was normal. Doppler: Transvalvular velocity was within the normal range. No regurgitation.  ------------------------------------------------------------ Right atrium: The atrium was mildly dilated.  ------------------------------------------------------------ Pericardium: There was no pericardial effusion.  ------------------------------------------------------------  2D measurements Normal Doppler Normal Left ventricle measurements LVID ED, 61.6 mm 43-52 Left ventricle chord, Ea, lat 8.11 cm/ ------- PLAX ann, tiss s LVID ES, 45.8 mm 23-38 DP chord, E/Ea, lat 12.95 ------- PLAX ann, tiss FS, chord, 26 % >29 DP PLAX Ea, med 6.36 cm/ ------- LVPW, ED 7.82 mm ------ ann, tiss s IVS/LVPW 0.97 <1.3 DP ratio, ED E/Ea, med 16.51 ------- Vol ED, 133 ml ------ ann, tiss MOD1 DP Vol ES, 90 ml ------ Mitral valve MOD1 Peak E vel 105 cm/ ------- EF, MOD1 32 % ------ s Vol index, 67 ml/m^2 ------ Peak A vel 130 cm/ ------- ED, MOD1 s Vol index, 45 ml/m^2 ------ Deceleratio 211 ms 150-230 ES, MOD1 n time Ventricular septum Peak 4 mm ------- IVS, ED 7.61 mm  ------ gradient, D Hg Aorta Peak E/A 0.8 ------- Root diam, 33 mm ------ ratio ED Systemic veins Left atrium Estimated 5 mm ------- AP dim 48 mm ------ CVP Hg AP dim 2.42 cm/m^2 <2.2 Right ventricle index Sa vel, lat 10.6 cm/ ------- Vol, S 72 ml ------ ann, tiss s Vol index, 36.4 ml/m^2 ------ DP S Right ventricle RVID ED, 27.7 mm 19-38 PLAX  ------------------------------------------------------------ Prepared and Electronically Authenticated by  [name] 2013-12-07T13:55:[id number]  PHYSICAL EXAM General: NAD Neck: No JVD, no thyromegaly. Lungs: Clear to auscultation bilaterally with normal respiratory effort. CV: Nondisplaced PMI.  Heart regular S1/S2, no S3/S4, no murmur.  No peripheral edema.  No carotid bruit.   Abdomen: Soft, nontender, no hepatosplenomegaly, no distention.  Neurologic: Alert and oriented x 3.  Psych: Normal affect. Extremities: No clubbing or cyanosis. No groin hematoma.  TELEMETRY: Reviewed telemetry pt in NSR  ASSESSMENT AND PLAN:  78 yo with history of CAD s/p CABG, DM2, prior CVA presented with inferior MI, now s/p  DES to SVG-ramus.   1. CAD: Inferior MI on ECG with DES to SVG-ramus yesterday.  This stent was placed just proximal to and overlapping with the a prior stent in the same graft.  SVG to RCA chronically occluded, sequential SVG to OM occluded. Good collaterals. No chest pain.  - Continue ASA 81, Brilinta, atorvastatin 80.  - BP improved this morning, may continue his Coreg and ramipril. - Echo shows decrease in EF from 50-55% in September to 40% now post MI. 2. Chronic diastolic/systolic CHF: Continue home Lasix dosing.  3. Diabetes: Holding metformin with recent cath.  Covering with sliding scale insulin + saxagliptin.   Patient progressing well. Anticipate DC home tomorrow morning.   Theron Arista [facility] 06/20/2012 11:19 AM

## 2012-06-21 ENCOUNTER — Encounter: Payer: Self-pay | Admitting: *Deleted

## 2012-06-21 DIAGNOSIS — I25810 Atherosclerosis of coronary artery bypass graft(s) without angina pectoris: Secondary | ICD-10-CM

## 2012-06-21 DIAGNOSIS — I5042 Chronic combined systolic (congestive) and diastolic (congestive) heart failure: Secondary | ICD-10-CM

## 2012-06-21 DIAGNOSIS — I255 Ischemic cardiomyopathy: Secondary | ICD-10-CM

## 2012-06-21 LAB — GLUCOSE, CAPILLARY: Glucose-Capillary: 201 mg/dL — ABNORMAL HIGH (ref 70–99)

## 2012-06-21 MED ORDER — ATORVASTATIN CALCIUM 80 MG PO TABS: 80.0000 mg | ORAL_TABLET | Freq: Every day | ORAL | Status: DC

## 2012-06-21 MED ORDER — TICAGRELOR 90 MG PO TABS: 90.0000 mg | ORAL_TABLET | Freq: Two times a day (BID) | ORAL | Status: DC

## 2012-06-21 MED ORDER — POTASSIUM CHLORIDE CRYS ER 10 MEQ PO TBCR: 10.0000 meq | EXTENDED_RELEASE_TABLET | Freq: Every day | ORAL | Status: DC

## 2012-06-21 MED ORDER — FUROSEMIDE 20 MG PO TABS: 20.0000 mg | ORAL_TABLET | Freq: Every day | ORAL | Status: DC

## 2012-06-21 MED ORDER — ASPIRIN 81 MG PO CHEW: 81.0000 mg | CHEWABLE_TABLET | Freq: Every day | ORAL | Status: DC

## 2012-06-21 MED FILL — Heparin Sodium (Porcine) 2 Unit/ML in Sodium Chloride 0.9%: INTRAMUSCULAR | Qty: 500 | Status: AC

## 2012-06-21 MED FILL — Docusate Sodium Cap 100 MG: ORAL | Qty: 1 | Status: AC

## 2012-06-21 MED FILL — Lidocaine HCl Local Preservative Free (PF) Inj 1%: INTRAMUSCULAR | Qty: 30 | Status: AC

## 2012-06-21 MED FILL — Bivalirudin Trifluoroacetate For IV Soln 250 MG (Base Equiv): INTRAVENOUS | Qty: 250 | Status: AC

## 2012-06-21 MED FILL — Dextrose Inj 5%: INTRAVENOUS | Qty: 50 | Status: AC

## 2012-06-21 NOTE — Discharge Summary (Signed)
Patient seen and examined and history reviewed. Agree with above findings and plan. See rounding note earlier today.  Theron Arista [facility] 06/21/2012 10:50 AM

## 2012-06-21 NOTE — Progress Notes (Signed)
Patient ID: Jeffrey [name], male   DOB: 04-29-1929, 78 y.o.   MRN: [id number]    SUBJECTIVE: No complaints this morning, no chest pain.   No lightheadedness.  Ambulating in hall. A little SOB.     Marland Kitchen aspirin  81 mg Oral Daily  . atorvastatin  80 mg Oral q1800  . B-complex with vitamin C  1 tablet Oral Daily  . calcium carbonate  1 tablet Oral TID PC & HS  . carvedilol  6.25 mg Oral BID WC  . docusate sodium  100 mg Oral QHS  . furosemide  20 mg Oral Daily  . insulin aspart  0-15 Units Subcutaneous TID WC  . insulin aspart  0-5 Units Subcutaneous QHS  . linagliptin  5 mg Oral Daily  . multivitamin with minerals  1 tablet Oral Daily  . nitrofurantoin  100 mg Oral QHS  . pantoprazole  40 mg Oral Daily  . potassium chloride  10 mEq Oral Daily  . ramipril  10 mg Oral Daily  . Tamsulosin HCl  0.4 mg Oral QHS  . Ticagrelor  90 mg Oral BID  . [DISCONTINUED] Docusate Sodium  100 mg Oral QHS      Filed Vitals:   06/20/12 1004 06/20/12 1316 06/20/12 2100 06/21/12 0500  BP: 115/59 96/49 110/60 115/68  Pulse:  76 71 73  Temp:  98 F (36.7 C) 98.3 F (36.8 C) 98.4 F (36.9 C)  TempSrc:  Oral    Resp:  18 18 18   Height:      Weight:    81.239 kg (179 lb 1.6 oz)  SpO2:  100% 97% 98%   No intake or output data in the 24 hours ending 06/21/12 0743  LABS: Basic Metabolic Panel:  Basename 06/20/12 0600 06/19/12 0545  NA 138 137  K 3.9 3.7  CL 103 101  CO2 26 24  GLUCOSE 211* 191*  BUN 13 11  CREATININE 1.03 0.92  CALCIUM 9.2 9.1  MG -- --  PHOS -- --   Liver Function Tests:  Basename 06/19/12 0545 06/18/12 1510  AST 35 29  ALT 22 26  ALKPHOS 65 59  BILITOT 0.4 0.2*  PROT 6.4 6.4  ALBUMIN 3.5 3.6   No results found for this basename: LIPASE:2,AMYLASE:2 in the last 72 hours CBC:  Basename 06/20/12 0600 06/19/12 0545  WBC 6.5 8.2  NEUTROABS -- --  HGB 11.4* 11.6*  HCT 34.7* 35.6*  MCV 89.2 89.2  PLT 151 163   Cardiac Enzymes:  Basename 06/19/12 0545  06/18/12 2352 06/18/12 1741  CKTOTAL -- -- --  CKMB -- -- --  CKMBINDEX -- -- --  TROPONINI 7.38* 5.89* 4.01*   Hemoglobin A1C:  Basename 06/18/12 1741  HGBA1C 8.0*   Fasting Lipid Panel:  Basename 06/19/12 0545  CHOL 113  HDL 40  LDLCALC 40  TRIG 165*  CHOLHDL 2.8  LDLDIRECT --    RADIOLOGY: US Carotid Duplex Bilateral  06/14/2012  *RADIOLOGY REPORT*  Clinical Data: Bruit  BILATERAL CAROTID DUPLEX ULTRASOUND  Technique: Gray scale imaging, color Doppler and duplex ultrasound was performed of bilateral carotid and vertebral arteries in the neck.  Comparison:  Carotid duplex ultrasound 04/08/2010  Criteria:  Quantification of carotid stenosis is based on velocity parameters that correlate the residual internal carotid diameter with NASCET-based stenosis levels, using the diameter of the distal internal carotid lumen as the denominator for stenosis measurement.  The following velocity measurements were obtained:  PEAK SYSTOLIC/END DIASTOLIC RIGHT ICA:                        93/15cm/sec CCA:                        90/9cm/sec SYSTOLIC ICA/CCA RATIO:     1.0 DIASTOLIC ICA/CCA RATIO:    1.7 ECA:                        161cm/sec  LEFT ICA:                        67/18cm/sec CCA:                        111/10cm/sec SYSTOLIC ICA/CCA RATIO:     0.6 DIASTOLIC ICA/CCA RATIO:    1.8 ECA:                        122cm/sec  Findings:  RIGHT CAROTID ARTERY: Scattered areas of atheromatous plaque without significant stenosis by gray scale, color Doppler or spectral Doppler evaluation. Pulsus bisferiens waveform noted in the common and internal carotid arteries.  RIGHT VERTEBRAL ARTERY:  Patent with normal antegrade flow  LEFT CAROTID ARTERY: Scattered areas of atherosclerotic plaque in the left carotid artery, particularly along the back wall without evidence of hemodynamically significant stenosis by gray scale, color Doppler or spectral Doppler imaging. Pulsus bisferiens waveform noted in  the common and internal carotid arteries.  LEFT VERTEBRAL ARTERY:  Patent with normal antegrade flow  IMPRESSION:  1. Pulsus bisferiens waveform noted in the bilateral common and internal carotid arteries.  This wave form can be seen in patients with underlying aortic valvular disease, specifically with aortic regurgitation.  If clinically warranted further evaluation with echocardiography could confirm.  2.  Scattered atherosclerotic plaque in the bilateral carotid arteries without significant stenosis or interval progression compared to 04/08/2010.  3.  Patent bilateral vertebral arteries with normal antegrade flow.  Signed,  Sterling Big, MD Vascular & Interventional Radiologist [facility]iology   Original Report Authenticated By: Malachy Moan, M.D.    Portable Chest X-ray 1 View  06/18/2012  *RADIOLOGY REPORT*  Clinical Data: Myocardial infarction.  PORTABLE CHEST - 1 VIEW  Comparison: 05/17/2012.  Findings: The cardiac silhouette remains borderline enlarged. Stable post CABG changes. Small amount of linear density at the left lung base.  Otherwise, clear lungs with normal vascularity. Thoracic spine degenerative changes.  IMPRESSION: Minimal left basilar atelectasis.   Original Report Authenticated By: Beckie Salts, M.D.    ECHO:Transthoracic Echocardiography  Patient: Jeffrey, [name] MR #: [id number] Study Date: 06/19/2012 Gender: M Age: 64 Height: 177.8cm Weight: 79.8kg BSA: 1.31m^2 Pt. Status: Room: MCCL  ORDERING [place], Thaily Hackworth PERFORMING [place], [facility] ATTENDING [place], [place] L SONOGRAPHER Georgian Co, RDCS, CCT cc:  ------------------------------------------------------------ LV EF: 40%  ------------------------------------------------------------ Indications: MI - acute 410.91.  ------------------------------------------------------------ History: Risk factors: Hypertension. Diabetes  mellitus. Dyslipidemia.  ------------------------------------------------------------ Study Conclusions  - Left ventricle: Severe hypokinesis of inferior wall and inferolateral wall. The cavity size was moderately dilated. The estimated ejection fraction was 40%. Doppler parameters are consistent with abnormal left ventricular relaxation (grade 1 diastolic dysfunction). - Aortic valve: Sclerosis without stenosis. - Mitral valve: Calcified annulus. - Left atrium: The atrium was moderately dilated. - Right ventricle: Systolic function was mildly reduced. - Right atrium: The atrium  was mildly dilated.  ------------------------------------------------------------ Labs, prior tests, procedures, and surgery: Coronary artery bypass grafting.  Transthoracic echocardiography. M-mode, complete 2D, spectral Doppler, and color Doppler. Height: Height: 177.8cm. Height: 70in. Weight: Weight: 79.8kg. Weight: 175.6lb. Body mass index: BMI: 25.3kg/m^2. Body surface area: BSA: 1.74m^2. Blood pressure: 88/52. Patient status: Inpatient. Location: ICU/CCU  ------------------------------------------------------------  ------------------------------------------------------------ Left ventricle: Severe hypokinesis of inferior wall and inferolateral wall. The cavity size was moderately dilated. The estimated ejection fraction was 40%. Doppler parameters are consistent with abnormal left ventricular relaxation (grade 1 diastolic dysfunction).  ------------------------------------------------------------ Aortic valve: Sclerosis without stenosis. Doppler: No significant regurgitation.  ------------------------------------------------------------ Aorta: Aortic root: The aortic root was normal in size.  ------------------------------------------------------------ Mitral valve: Calcified annulus. Doppler: No significant regurgitation. Peak gradient: 4mm Hg  (D).  ------------------------------------------------------------ Left atrium: The atrium was moderately dilated.  ------------------------------------------------------------ Right ventricle: The cavity size was normal. Systolic function was mildly reduced.  ------------------------------------------------------------ Pulmonic valve: The valve appears to be grossly normal. Doppler: No significant regurgitation.  ------------------------------------------------------------ Tricuspid valve: Structurally normal valve. Leaflet separation was normal. Doppler: Transvalvular velocity was within the normal range. No regurgitation.  ------------------------------------------------------------ Right atrium: The atrium was mildly dilated.  ------------------------------------------------------------ Pericardium: There was no pericardial effusion.  ------------------------------------------------------------  2D measurements Normal Doppler Normal Left ventricle measurements LVID ED, 61.6 mm 43-52 Left ventricle chord, Ea, lat 8.11 cm/ ------- PLAX ann, tiss s LVID ES, 45.8 mm 23-38 DP chord, E/Ea, lat 12.95 ------- PLAX ann, tiss FS, chord, 26 % >29 DP PLAX Ea, med 6.36 cm/ ------- LVPW, ED 7.82 mm ------ ann, tiss s IVS/LVPW 0.97 <1.3 DP ratio, ED E/Ea, med 16.51 ------- Vol ED, 133 ml ------ ann, tiss MOD1 DP Vol ES, 90 ml ------ Mitral valve MOD1 Peak E vel 105 cm/ ------- EF, MOD1 32 % ------ s Vol index, 67 ml/m^2 ------ Peak A vel 130 cm/ ------- ED, MOD1 s Vol index, 45 ml/m^2 ------ Deceleratio 211 ms 150-230 ES, MOD1 n time Ventricular septum Peak 4 mm ------- IVS, ED 7.61 mm ------ gradient, D Hg Aorta Peak E/A 0.8 ------- Root diam, 33 mm ------ ratio ED Systemic veins Left atrium Estimated 5 mm ------- AP dim 48 mm ------ CVP Hg AP dim 2.42 cm/m^2 <2.2 Right ventricle index Sa vel, lat 10.6 cm/ ------- Vol, S 72 ml ------ ann, tiss s Vol index, 36.4 ml/m^2  ------ DP S Right ventricle RVID ED, 27.7 mm 19-38 PLAX  ------------------------------------------------------------ Prepared and Electronically Authenticated by  [name] 2013-12-07T13:55:[id number]  PHYSICAL EXAM General: NAD Neck: No JVD, no thyromegaly. Lungs: Clear to auscultation bilaterally with normal respiratory effort. CV: Nondisplaced PMI.  Heart regular S1/S2, no S3/S4, no murmur.  No peripheral edema.  No carotid bruit.   Abdomen: Soft, nontender, no hepatosplenomegaly, no distention.  Neurologic: Alert and oriented x 3.  Psych: Normal affect. Extremities: No clubbing or cyanosis. No groin hematoma.  TELEMETRY: Reviewed telemetry pt in NSR  ASSESSMENT AND PLAN:  78 yo with history of CAD s/p CABG, DM2, prior CVA presented with inferior MI, now s/p DES to SVG-ramus.   1. CAD: Inferior MI on ECG with DES to SVG-ramus yesterday.  This stent was placed just proximal to and overlapping with the a prior stent in the same graft.  SVG to RCA chronically occluded, sequential SVG to OM occluded. Good collaterals. No chest pain.  - Continue ASA 81, Brilinta, atorvastatin 80.  - BP improved this morning, may continue his Coreg and ramipril. - Echo shows decrease in EF from 50-55%  in September to 40% post MI. 2. Chronic diastolic/systolic CHF: Continue home Lasix dosing.  3. Diabetes: Can resume metformin on discharge today.  Patient progressing well. Plan to DC home today. Follow up in our [place] office.  Theron Arista [facility] 06/21/2012 7:43 AM

## 2012-06-21 NOTE — Discharge Summary (Signed)
Discharge Summary   Patient ID: Jeffrey [name],  MRN: [id number], DOB/AGE: 1928-08-09 78 y.o.  Admit date: 06/18/2012 Discharge date: 06/21/2012  Primary Physician: [name], MD Primary Cardiologist:  Bing, MD  Discharge Diagnoses Principal Problem:  *ST elevation myocardial infarction (STEMI) of inferoposterior wall, initial episode of care  - s/p cardiac cath 06/18/12: prox SVG-Ramus intermediate 95% ISR s/p DES   - to continue DAPT- ASA/Brilinta indefinitely Active Problems:  Arteriosclerotic cardiovascular disease  CAD of artery bypass graft  Hyperlipidemia  - atorvastatin increased to 80mg   Dementia  Diabetes mellitus, type II  Hypertension  Cardiomyopathy, ischemic  Chronic combined systolic and diastolic CHF (congestive heart failure)  - TTE: EF 40%, grade 1 dd, severe HK of inf and inferolateral wall, moderate LV dilatation, moderate LA dilatation, mild RV systolic dysfunction and mild RA dilatation   Allergies No Known Allergies  Diagnostic Studies/Procedures  PORTABLE CHEST X-RAY - 06/18/12  Comparison: 05/17/2012.  Findings: The cardiac silhouette remains borderline enlarged.  Stable post CABG changes. Small amount of linear density at the  left lung base. Otherwise, clear lungs with normal vascularity.  Thoracic spine degenerative changes.  IMPRESSION:  Minimal left basilar atelectasis.  CARDIAC CATHETERIZATION - 06/18/12  Hemodynamics:  AO 121/72 with a mean of 95 mmHg  LV N/A  Coronary angiography:  Coronary dominance: right  Left mainstem: The left main coronary is heavily calcified. It is diffusely narrowed up to 30-40%.  Left anterior descending (LAD): The LAD is also moderate to severely calcified in the proximal vessel. There is a 90% stenosis proximally. This is followed by diffuse disease in the mid vessel up to 50%. The distal LAD fills by the IMA graft. There are 2 small diagonal branches which are diffusely diseased.  The  ramus intermediate branch is occluded.  Left circumflex (LCx): The left circumflex is occluded distally. The first obtuse marginal vessel is occluded.  Right coronary artery (RCA): The native right coronary is severely diseased proximally up to 80%. In the mid vessel is occluded. There are right to right collaterals to a large right ventricular marginal branch. The distal right coronary supplied by extensive left-to-right collaterals.  The saphenous vein graft to the ramus intermediate branch has a 95% stenosis proximally within the site of a previous stent. The continuation of his vein graft to the OM is occluded.  Saphenous vein graft to the right coronary is occluded.  The LIMA graft to the LAD is widely patent.  Left ventriculography: Not performed.   PCI Data:  Vessel - saphenous vein graft to the intermediate/Segment - proximal  Percent Stenosis (pre) 95%  TIMI-flow 2  Stent 3.0 x 12 mm Promus premier  Percent Stenosis (post) 0%  TIMI-flow (post) 3  Final Conclusions:  1. Severe three-vessel obstructive coronary disease.  2. 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 Patent LIMA graft to the LAD  4. Critical stenosis in the saphenous vein graft to the ramus intermediate branch  5. Successful stenting of the saphenous vein graft to the ramus intermediate branch with a drug-eluting stent  TRANSTHORACIC ECHOCARDIOGRAM - 06/19/12  - Left ventricle: Severe hypokinesis of inferior wall and inferolateral wall. The cavity size was moderately dilated. The estimated ejection fraction was 40%. Doppler parameters are consistent with abnormal left ventricular relaxation (grade 1 diastolic dysfunction). - Aortic valve: Sclerosis without stenosis. - Mitral valve: Calcified annulus. - Left atrium: The atrium was moderately dilated. - Right ventricle: Systolic function was mildly reduced. -  Right atrium: The atrium was mildly dilated.  History of Present Illness  Jeffrey [name]  is a 78yo male who was admitted to [facility] with the above problem list. He had a history of recent PCI 02/2012. He reported feeling poorly for the past three days, but no specific complaints. The date of admission, between 12:30 and 1:00 PM, he reported left arm pain rated at a 8/10 on minimal exertion noted to be his anginal equivalent. No associated symptoms. He then developed substernal chest pain relieved by NTG SL x 2 without relief. He presented to the [facility] ED where EKG revealed inferior STEMI. CXR as above revealed minimal basilar atelectasis. He was heparinized and transferred emergently to [facility] for cath.   Hospital Course   The procedure is detailed in full above. 30-40% diffuse left main narrowing, 90% prox LAD stenosis, 50% mid LAD stenosis, distal LAD filled by IMA graft, diffusely disease D1-D2, distal LCx occlusion, OM1 occlusion, prox 80% RCA, mid RCA occlusion, R-R collaterals via RV marginal branch, distal R coronary collateralized L-R, prox SVG-Ramus intermediate 95% ISR, continuation of VG-OM occluded, SVG-RCA occluded, LIMA-LAD occluded; DES-prox SVG-Ramus intermediate branch. The patient tolerated the procedure well without complications. The recommendation was made to continue DAPT- ASA/Brilinta indefinitely. He underwent 2D echo as above revealed reduced EF at 40%, grade 1 dd, severe HK of inf and inferolateral wall, moderate LV dilatation, moderate LA dilatation, mild RV systolic dysfunction and mild RA dilatation. Troponin trend noted below. He was started on high-dose atorvastatin. Prior cardiac meds were resumed. He remained asymptomatic and ambulated without incident. He was transferred to telemetry in stable condition. He was evaluated by Dr. [place] this morning, and was deemed stable for discharge. He will follow-up within 7 days given STEMI status. [facility] services have been recommended to be arranged at the time of follow-up. Discharge medications are outlined  below. This information, including post-cath instructions, has been clearly outlined in the discharge AVS.   Discharge Vitals:  Blood pressure 116/74, pulse 73, temperature 98.4 F (36.9 C), temperature source Oral, resp. rate 18, height 5\' 10"  (1.778 m), weight 81.239 kg (179 lb 1.6 oz), SpO2 98.00%.   Weight change: -0.771 kg (-1 lb 11.2 oz)  Labs: Recent Labs  Basename 06/20/12 0600 06/19/12 0545   WBC 6.5 8.2   HGB 11.4* 11.6*   HCT 34.7* 35.6*   MCV 89.2 89.2   PLT 151 163    Lab 06/20/12 0600 06/19/12 0545 06/18/12 1510  NA 138 137 136  K 3.9 3.7 4.0  CL 103 101 100  CO2 26 24 24   BUN 13 11 14   CREATININE 1.03 0.92 1.10  CALCIUM 9.2 9.1 9.5  PROT -- 6.4 --  BILITOT -- 0.4 --  ALKPHOS -- 65 --  ALT -- 22 --  AST -- 35 --  AMYLASE -- -- --  LIPASE -- -- --  GLUCOSE 211* 191* 209*   Recent Labs  Basename 06/18/12 1741   HGBA1C 8.0*   Recent Labs  Basename 06/19/12 0545 06/18/12 2352 06/18/12 1741   CKTOTAL -- -- --   CKMB -- -- --   CKMBINDEX -- -- --   TROPONINI 7.38* 5.89* 4.01*   Recent Labs  Basename 06/19/12 0545   CHOL 113   HDL 40   LDLCALC 40   TRIG 165*   CHOLHDL 2.8   LDLDIRECT --   Disposition:  Discharge Orders    Future Appointments: Provider: Department: Dept Phone: Center:  06/25/2012 1:40 PM Jodelle Gross, NP [facility] [place] [phone] [id number]   08/18/2012 1:00 PM Kathlen Brunswick, MD [facility] [place] [phone] [id number]     Future Orders Please Complete By Expires   Diet - low sodium heart healthy      Increase activity slowly        Follow-up Information    Follow up with Joni Reining, NP. On 06/25/2012. (At 1:40 PM for follow-up after this admission. )    Contact information:   [address] [place] [address] [phone]         Discharge Medications:    Medication List     As of 06/21/2012 10:49 AM    START taking these medications         aspirin 81  MG chewable tablet   Chew 1 tablet (81 mg total) by mouth daily.   Replaces: aspirin 325 MG tablet      atorvastatin 80 MG tablet   Commonly known as: LIPITOR   Take 1 tablet (80 mg total) by mouth daily at 6 PM.   Replaces: rosuvastatin 10 MG tablet      CHANGE how you take these medications         furosemide 20 MG tablet   Commonly known as: LASIX   Take 1 tablet (20 mg total) by mouth daily.   What changed: - dose - route (how to take the med) - how often to take the med      potassium chloride 10 MEQ tablet   Commonly known as: K-DUR,KLOR-CON   Take 1 tablet (10 mEq total) by mouth daily.   What changed: - dose - route (how to take the med) - how often to take the med      CONTINUE taking these medications         albuterol 108 (90 BASE) MCG/ACT inhaler   Commonly known as: PROVENTIL HFA;VENTOLIN HFA      B-COMPLEX PO      calcium carbonate 500 MG chewable tablet   Commonly known as: TUMS - dosed in mg elemental calcium      carvedilol 6.25 MG tablet   Commonly known as: COREG      fish oil-omega-3 fatty acids 1000 MG capsule      metFORMIN 500 MG tablet   Commonly known as: GLUCOPHAGE      multivitamin with minerals Tabs      nitrofurantoin 100 MG capsule   Commonly known as: MACRODANTIN      nitroGLYCERIN 0.4 MG SL tablet   Commonly known as: NITROSTAT   Place 1 tablet (0.4 mg total) under the tongue every 5 (five) minutes x 3 doses as needed for chest pain.      ONGLYZA 5 MG Tabs tablet   Generic drug: saxagliptin HCl      pantoprazole 40 MG tablet   Commonly known as: PROTONIX      ramipril 10 MG capsule   Commonly known as: ALTACE   Take 1 capsule (10 mg total) by mouth daily.      STOOL SOFTENER 100 MG capsule   Generic drug: Docusate Sodium      Tamsulosin HCl 0.4 MG Caps   Commonly known as: FLOMAX      Ticagrelor 90 MG Tabs tablet   Commonly known as: BRILINTA   Take 1 tablet (90 mg total) by mouth 2 (two) times daily.        STOP taking these medications  aspirin 325 MG tablet   Replaced by: aspirin 81 MG chewable tablet      rosuvastatin 10 MG tablet   Commonly known as: CRESTOR   Replaced by: atorvastatin 80 MG tablet          Where to get your medications    These are the prescriptions that you need to pick up. We sent them to a specific pharmacy, so you will need to go there to get them.   [facility] - [place], [place] - [address]    [address] [place] [address]    Phone: [phone]        atorvastatin 80 MG tablet   furosemide 20 MG tablet   potassium chloride 10 MEQ tablet   Ticagrelor 90 MG Tabs tablet         Information on where to get these meds is not yet available. Ask your nurse or doctor.         aspirin 81 MG chewable tablet           Outstanding Labs/Studies: None   Duration of Discharge Encounter: Greater than 30 minutes including physician time.  Signed, R. Hurman Horn, PA-C 06/21/2012, 10:49 AM

## 2012-06-21 NOTE — Progress Notes (Signed)
CARDIAC REHAB PHASE I   PRE:  Rate/Rhythm: 74 SR  BP:  Supine:   Sitting: 116/68  Standing:    SaO2: 98 RA  MODE:  Ambulation: 350 ft   POST:  Rate/Rhythem: 74  BP:  Supine:   Sitting: 116/74  Standing:    SaO2: 98 RA 1020-1050 Assisted X 1 and used walker to ambulate. Gait steady with walker. VS stable. Reviewed proper use of sl NTG, walking and talking brlinita. He voices understanding.  [name]

## 2012-06-23 ENCOUNTER — Observation Stay (HOSPITAL_COMMUNITY)
Admission: EM | Admit: 2012-06-23 | Discharge: 2012-06-26 | Disposition: A | Discharge status: Home / Self Care | Payer: Medicare Other | Attending: Internal Medicine | Admitting: Internal Medicine

## 2012-06-23 ENCOUNTER — Emergency Department (HOSPITAL_COMMUNITY): Payer: Medicare Other

## 2012-06-23 ENCOUNTER — Encounter (HOSPITAL_COMMUNITY): Payer: Self-pay | Admitting: Emergency Medicine

## 2012-06-23 DIAGNOSIS — F0390 Unspecified dementia without behavioral disturbance: Secondary | ICD-10-CM | POA: Diagnosis present

## 2012-06-23 DIAGNOSIS — R531 Weakness: Secondary | ICD-10-CM | POA: Diagnosis present

## 2012-06-23 DIAGNOSIS — I219 Acute myocardial infarction, unspecified: Secondary | ICD-10-CM | POA: Insufficient documentation

## 2012-06-23 DIAGNOSIS — R739 Hyperglycemia, unspecified: Secondary | ICD-10-CM

## 2012-06-23 DIAGNOSIS — R5381 Other malaise: Secondary | ICD-10-CM | POA: Insufficient documentation

## 2012-06-23 DIAGNOSIS — R197 Diarrhea, unspecified: Secondary | ICD-10-CM | POA: Insufficient documentation

## 2012-06-23 DIAGNOSIS — I25810 Atherosclerosis of coronary artery bypass graft(s) without angina pectoris: Secondary | ICD-10-CM

## 2012-06-23 DIAGNOSIS — R5383 Other fatigue: Secondary | ICD-10-CM | POA: Diagnosis present

## 2012-06-23 DIAGNOSIS — I2119 ST elevation (STEMI) myocardial infarction involving other coronary artery of inferior wall: Secondary | ICD-10-CM

## 2012-06-23 DIAGNOSIS — I509 Heart failure, unspecified: Secondary | ICD-10-CM | POA: Insufficient documentation

## 2012-06-23 DIAGNOSIS — E119 Type 2 diabetes mellitus without complications: Secondary | ICD-10-CM | POA: Diagnosis present

## 2012-06-23 DIAGNOSIS — I951 Orthostatic hypotension: Principal | ICD-10-CM | POA: Diagnosis present

## 2012-06-23 DIAGNOSIS — I679 Cerebrovascular disease, unspecified: Secondary | ICD-10-CM

## 2012-06-23 DIAGNOSIS — I2510 Atherosclerotic heart disease of native coronary artery without angina pectoris: Secondary | ICD-10-CM

## 2012-06-23 DIAGNOSIS — I10 Essential (primary) hypertension: Secondary | ICD-10-CM | POA: Diagnosis present

## 2012-06-23 DIAGNOSIS — R079 Chest pain, unspecified: Secondary | ICD-10-CM | POA: Diagnosis present

## 2012-06-23 DIAGNOSIS — E861 Hypovolemia: Secondary | ICD-10-CM | POA: Insufficient documentation

## 2012-06-23 DIAGNOSIS — I5042 Chronic combined systolic (congestive) and diastolic (congestive) heart failure: Secondary | ICD-10-CM | POA: Diagnosis present

## 2012-06-23 DIAGNOSIS — E785 Hyperlipidemia, unspecified: Secondary | ICD-10-CM | POA: Diagnosis present

## 2012-06-23 DIAGNOSIS — I255 Ischemic cardiomyopathy: Secondary | ICD-10-CM

## 2012-06-23 DIAGNOSIS — N400 Enlarged prostate without lower urinary tract symptoms: Secondary | ICD-10-CM

## 2012-06-23 HISTORY — DX: Ischemic cardiomyopathy: I25.5

## 2012-06-23 LAB — BASIC METABOLIC PANEL
BUN: 16 mg/dL (ref 6–23)
CO2: 23 mEq/L (ref 19–32)
Calcium: 9.5 mg/dL (ref 8.4–10.5)
Chloride: 100 mEq/L (ref 96–112)
Creatinine, Ser: 1.06 mg/dL (ref 0.50–1.35)

## 2012-06-23 LAB — CBC WITH DIFFERENTIAL/PLATELET
Basophils Absolute: 0 10*3/uL (ref 0.0–0.1)
Basophils Relative: 1 % (ref 0–1)
Eosinophils Absolute: 0.3 10*3/uL (ref 0.0–0.7)
Eosinophils Relative: 4 % (ref 0–5)
HCT: 36.9 % — ABNORMAL LOW (ref 39.0–52.0)
Hemoglobin: 12.4 g/dL — ABNORMAL LOW (ref 13.0–17.0)
Lymphocytes Relative: 21 % (ref 12–46)
Lymphs Abs: 1.5 10*3/uL (ref 0.7–4.0)
MCH: 30.1 pg (ref 26.0–34.0)
MCHC: 33.6 g/dL (ref 30.0–36.0)
MCV: 89.6 fL (ref 78.0–100.0)
Monocytes Absolute: 0.6 10*3/uL (ref 0.1–1.0)
Monocytes Relative: 9 % (ref 3–12)
Neutro Abs: 4.8 10*3/uL (ref 1.7–7.7)
Neutrophils Relative %: 66 % (ref 43–77)
Platelets: 179 10*3/uL (ref 150–400)
RBC: 4.12 MIL/uL — ABNORMAL LOW (ref 4.22–5.81)
RDW: 13.8 % (ref 11.5–15.5)
WBC: 7.3 10*3/uL (ref 4.0–10.5)

## 2012-06-23 LAB — TROPONIN I: Troponin I: 0.31 ng/mL (ref <0.30)

## 2012-06-23 NOTE — ED Notes (Signed)
Paged [place] again through c-link. Jeffrey [name]

## 2012-06-23 NOTE — ED Notes (Addendum)
Pt comes from home with c/o generalized weakness since Monday. Pt had stent placed Friday night and was discharged from hospital on Monday. Pt states "every since i got home i've felt like crap". Pt took 1 nitro sublingual tablet pta. 20g IV in left forearm. Pt states he also took "2 baby aspirins and a big aspirin tonight". Pt states he had chest pain earlier tonight but states it stopped after nitro tablet. Pt states "i feel short winded but that's normal for me". Pt CBG is 247 via EMS.

## 2012-06-23 NOTE — ED Notes (Signed)
Spoke with lab regarding critical Troponin.  Dr. Bebe Shaggy notified.

## 2012-06-23 NOTE — ED Notes (Signed)
Pt resting quietly. No distress noted.  Denies needs at present time.

## 2012-06-23 NOTE — ED Provider Notes (Signed)
History     CSN: [id number]  Arrival date & time 06/23/12  1857   First MD Initiated Contact with Patient 06/23/12 1922      Chief Complaint  Patient presents with  . Weakness    (Consider location/radiation/quality/duration/timing/severity/associated sxs/prior treatment) Patient is a 78 y.o. male presenting with weakness. The history is provided by the patient (pt complains of weakness for 2-3 days and some chest pain.  no pain now). No language interpreter was used.  Weakness Primary symptoms do not include headaches, syncope or seizures. The symptoms began 6 to 12 hours ago. The symptoms are improving. The neurological symptoms are diffuse. Context: nothing.  Additional symptoms include weakness. Additional symptoms do not include hallucinations.    Past Medical History  Diagnosis Date  . Diabetes mellitus, type II   . Hyperlipidemia     Lipid profile in 02/2012:135, 227, 41, 49  . Arteriosclerotic cardiovascular disease (ASCVD)     CABG x 4 1989, VG->OM1->OM2, VG->RCA, LIMA->LAD; 05/24/10 Cath/PCI:  VG->OM1->OM2 90 in graft (3.0x15 promus) & 90 in distal LCX (2.75x15 Promus);  NSTEMI in 04/2011 - TO distal LCX stent and VG->OM2.; 02/2012 NSTEMI Cath/PCI: 3VD, LIMA->LAD ok, VG->RCA 100, VG->OM1 99p (2.75x53mm Resolute DES), continuation to OM2 occluded.  . Hypertension   . Arteriosclerotic cardiovascular disease (ASCVD)   . Osteoarthritis   . CVA (cerebral infarction)   . Pneumonia   . Cervical vertebral fracture   . Chronic back pain   . Benign prostatic hypertrophy     Required urinary catheter x4 weeks for retention  . Peptic ulcer disease   . Gastroesophageal reflux disease   . Stroke     Past Surgical History  Procedure Date  . Coronary angioplasty with stent placement     2011, 02/2012  . Tonsillectomy   . Coronary artery bypass graft     History reviewed. No pertinent family history.  History  Substance Use Topics  . Smoking status: Former Smoker -- 2.0  packs/day for 10 years    Types: Cigarettes    Quit date: 07/14/1961  . Smokeless tobacco: Current User    Types: Chew  . Alcohol Use: No      Review of Systems  Constitutional: Negative for fatigue.  HENT: Negative for congestion, sinus pressure and ear discharge.   Eyes: Negative for discharge.  Respiratory: Negative for cough.   Cardiovascular: Negative for chest pain and syncope.  Gastrointestinal: Negative for abdominal pain and diarrhea.  Genitourinary: Negative for frequency and hematuria.  Musculoskeletal: Negative for back pain.  Skin: Negative for rash.  Neurological: Positive for weakness. Negative for seizures and headaches.  Hematological: Negative.   Psychiatric/Behavioral: Negative for hallucinations.    Allergies  Review of patient's allergies indicates no known allergies.  Home Medications   Current Outpatient Rx  Name  Route  Sig  Dispense  Refill  . ALBUTEROL SULFATE HFA 108 (90 BASE) MCG/ACT IN AERS   Inhalation   Inhale 2 puffs into the lungs every 6 (six) hours as needed. For shortness of breath         . ASPIRIN 81 MG PO CHEW   Oral   Chew 1 tablet (81 mg total) by mouth daily.         . ATORVASTATIN CALCIUM 80 MG PO TABS   Oral   Take 1 tablet (80 mg total) by mouth daily at 6 PM.   30 tablet   3   . B-COMPLEX PO   Oral   Take  1 tablet by mouth daily.          Marland Kitchen CALCIUM CARBONATE ANTACID 500 MG PO CHEW   Oral   Chew 1 tablet by mouth 4 (four) times daily - after meals and at bedtime.         Marland Kitchen CARVEDILOL 6.25 MG PO TABS   Oral   Take 6.25 mg by mouth 2 (two) times daily with a meal.         . DOCUSATE SODIUM 100 MG PO TABS   Oral   Take 100 mg by mouth at bedtime. Constipation         . OMEGA-3 FATTY ACIDS 1000 MG PO CAPS   Oral   Take 1 g by mouth daily.         . FUROSEMIDE 20 MG PO TABS   Oral   Take 1 tablet (20 mg total) by mouth daily.   30 tablet   3   . METFORMIN HCL 500 MG PO TABS   Oral   Take  500 mg by mouth 2 (two) times daily.         . ADULT MULTIVITAMIN W/MINERALS CH   Oral   Take 1 tablet by mouth daily.         Marland Kitchen NITROFURANTOIN MACROCRYSTAL 100 MG PO CAPS   Oral   Take 100 mg by mouth at bedtime.         Marland Kitchen NITROGLYCERIN 0.4 MG SL SUBL   Sublingual   Place 1 tablet (0.4 mg total) under the tongue every 5 (five) minutes x 3 doses as needed for chest pain.   25 tablet   3   . PANTOPRAZOLE SODIUM 40 MG PO TBEC   Oral   Take 40 mg by mouth every morning.         Marland Kitchen POTASSIUM CHLORIDE CRYS ER 10 MEQ PO TBCR   Oral   Take 1 tablet (10 mEq total) by mouth daily.   30 tablet   3   . RAMIPRIL 10 MG PO CAPS   Oral   Take 1 capsule (10 mg total) by mouth daily.   30 capsule   6   . SAXAGLIPTIN HCL 5 MG PO TABS   Oral   Take 5 mg by mouth daily at 12 noon.          Marland Kitchen TAMSULOSIN HCL 0.4 MG PO CAPS   Oral   Take 0.4 mg by mouth at bedtime. For urinary health         . TICAGRELOR 90 MG PO TABS   Oral   Take 1 tablet (90 mg total) by mouth 2 (two) times daily.   60 tablet   6     BP 108/58  Pulse 83  Temp 97.8 F (36.6 C) (Oral)  Resp 18  SpO2 97%  Physical Exam  Constitutional: He is oriented to person, place, and time. He appears well-developed.  HENT:  Head: Normocephalic and atraumatic.  Eyes: Conjunctivae normal and EOM are normal. No scleral icterus.  Neck: Neck supple. No thyromegaly present.  Cardiovascular: Normal rate and regular rhythm.  Exam reveals no gallop and no friction rub.   No murmur heard. Pulmonary/Chest: No stridor. He has no wheezes. He has no rales. He exhibits no tenderness.  Abdominal: He exhibits no distension. There is no tenderness. There is no rebound.  Musculoskeletal: Normal range of motion. He exhibits no edema.  Lymphadenopathy:    He has no cervical adenopathy.  Neurological:  He is oriented to person, place, and time. Coordination normal.  Skin: No rash noted. No erythema.  Psychiatric: He has a  normal mood and affect. His behavior is normal.    ED Course  Procedures (including critical care time)  Labs Reviewed  CBC WITH DIFFERENTIAL - Abnormal; Notable for the following:    RBC 4.12 (*)     Hemoglobin 12.4 (*)     HCT 36.9 (*)     All other components within normal limits  BASIC METABOLIC PANEL - Abnormal; Notable for the following:    Glucose, Bld 212 (*)     GFR calc non Af Amer 64 (*)     GFR calc Af Amer 74 (*)     All other components within normal limits  TROPONIN I - Abnormal; Notable for the following:    Troponin I 0.31 (*)     All other components within normal limits  TROPONIN I   Dg Chest Port 1 View  06/23/2012  *RADIOLOGY REPORT*  Clinical Data: Weakness  PORTABLE CHEST - 1 VIEW  Comparison: None.  Findings: Postop CABG.  Negative for heart failure.  Mild left lower lobe airspace disease may represent atelectasis or pneumonia, unchanged.  Right lung is clear.  Negative for pleural effusion.  IMPRESSION: Mild left lower lobe atelectasis or infiltrate.  This is unchanged.   Original Report Authenticated By: Janeece Riggers, M.D.      No diagnosis found.    Date: 06/23/2012  Rate: 82  Rhythm: normal sinus rhythm  QRS Axis: normal  Intervals: normal  ST/T Wave abnormalities:inverted t waves  Conduction Disutrbances:none  Narrative Interpretation:   Old EKG Reviewed: unchanged   MDM  I spoke with [place] [facility]  The pt will get a repeat troponin and if normal he will be admitted [facility].  [facility]        [name], MD 06/23/12 2112

## 2012-06-23 NOTE — ED Notes (Signed)
Lab called with critical troponin value of 0.31. Dr. Estell Harpin notified.

## 2012-06-24 ENCOUNTER — Encounter (HOSPITAL_COMMUNITY): Payer: Self-pay | Admitting: *Deleted

## 2012-06-24 DIAGNOSIS — I951 Orthostatic hypotension: Secondary | ICD-10-CM | POA: Diagnosis present

## 2012-06-24 DIAGNOSIS — I509 Heart failure, unspecified: Secondary | ICD-10-CM

## 2012-06-24 DIAGNOSIS — I25810 Atherosclerosis of coronary artery bypass graft(s) without angina pectoris: Secondary | ICD-10-CM

## 2012-06-24 DIAGNOSIS — R079 Chest pain, unspecified: Secondary | ICD-10-CM | POA: Diagnosis present

## 2012-06-24 DIAGNOSIS — R5381 Other malaise: Secondary | ICD-10-CM

## 2012-06-24 DIAGNOSIS — R531 Weakness: Secondary | ICD-10-CM | POA: Diagnosis present

## 2012-06-24 DIAGNOSIS — I2510 Atherosclerotic heart disease of native coronary artery without angina pectoris: Secondary | ICD-10-CM

## 2012-06-24 DIAGNOSIS — R5383 Other fatigue: Secondary | ICD-10-CM | POA: Diagnosis present

## 2012-06-24 DIAGNOSIS — I5042 Chronic combined systolic (congestive) and diastolic (congestive) heart failure: Secondary | ICD-10-CM

## 2012-06-24 DIAGNOSIS — I7090 Unspecified atherosclerosis: Secondary | ICD-10-CM

## 2012-06-24 DIAGNOSIS — I2589 Other forms of chronic ischemic heart disease: Secondary | ICD-10-CM

## 2012-06-24 LAB — BASIC METABOLIC PANEL
CO2: 27 mEq/L (ref 19–32)
Calcium: 9.5 mg/dL (ref 8.4–10.5)
Sodium: 136 mEq/L (ref 135–145)

## 2012-06-24 LAB — GLUCOSE, CAPILLARY
Glucose-Capillary: 153 mg/dL — ABNORMAL HIGH (ref 70–99)
Glucose-Capillary: 162 mg/dL — ABNORMAL HIGH (ref 70–99)

## 2012-06-24 LAB — CBC
MCV: 90.1 fL (ref 78.0–100.0)
Platelets: 176 10*3/uL (ref 150–400)
RBC: 4.14 MIL/uL — ABNORMAL LOW (ref 4.22–5.81)
WBC: 7.1 10*3/uL (ref 4.0–10.5)

## 2012-06-24 MED ORDER — ENOXAPARIN SODIUM 40 MG/0.4ML ~~LOC~~ SOLN
40.0000 mg | SUBCUTANEOUS | Status: DC
  Administered 2012-06-24 – 2012-06-26 (×3): 40 mg via SUBCUTANEOUS
  Filled 2012-06-24 (×3): qty 0.4

## 2012-06-24 MED ORDER — NITROGLYCERIN 0.4 MG SL SUBL: 0.4000 mg | SUBLINGUAL_TABLET | SUBLINGUAL | Status: DC | PRN

## 2012-06-24 MED ORDER — BISACODYL 5 MG PO TBEC: 5.0000 mg | DELAYED_RELEASE_TABLET | Freq: Every day | ORAL | Status: DC | PRN

## 2012-06-24 MED ORDER — INSULIN ASPART 100 UNIT/ML ~~LOC~~ SOLN
0.0000 [IU] | Freq: Three times a day (TID) | SUBCUTANEOUS | Status: DC
  Administered 2012-06-24: 3 [IU] via SUBCUTANEOUS
  Administered 2012-06-24: 2 [IU] via SUBCUTANEOUS
  Administered 2012-06-24 – 2012-06-25 (×3): 3 [IU] via SUBCUTANEOUS
  Administered 2012-06-25: 2 [IU] via SUBCUTANEOUS
  Administered 2012-06-26 (×2): 3 [IU] via SUBCUTANEOUS

## 2012-06-24 MED ORDER — PANTOPRAZOLE SODIUM 40 MG PO TBEC
40.0000 mg | DELAYED_RELEASE_TABLET | Freq: Every day | ORAL | Status: DC
  Administered 2012-06-24 – 2012-06-26 (×3): 40 mg via ORAL
  Filled 2012-06-24 (×3): qty 1

## 2012-06-24 MED ORDER — ALUM & MAG HYDROXIDE-SIMETH 200-200-20 MG/5ML PO SUSP
30.0000 mL | Freq: Four times a day (QID) | ORAL | Status: DC | PRN
  Administered 2012-06-24: 30 mL via ORAL
  Filled 2012-06-24 (×2): qty 30

## 2012-06-24 MED ORDER — TRAZODONE HCL 50 MG PO TABS
25.0000 mg | ORAL_TABLET | Freq: Every evening | ORAL | Status: DC | PRN
  Administered 2012-06-24: 25 mg via ORAL
  Filled 2012-06-24: qty 1

## 2012-06-24 MED ORDER — ATORVASTATIN CALCIUM 40 MG PO TABS
80.0000 mg | ORAL_TABLET | Freq: Every day | ORAL | Status: DC
  Administered 2012-06-24 – 2012-06-25 (×2): 80 mg via ORAL
  Filled 2012-06-24 (×3): qty 2

## 2012-06-24 MED ORDER — OMEGA-3-ACID ETHYL ESTERS 1 G PO CAPS
1000.0000 mg | ORAL_CAPSULE | Freq: Every day | ORAL | Status: DC
  Administered 2012-06-24 – 2012-06-26 (×3): 1000 mg via ORAL
  Filled 2012-06-24 (×3): qty 1

## 2012-06-24 MED ORDER — ONDANSETRON HCL 4 MG/2ML IJ SOLN: 4.0000 mg | Freq: Four times a day (QID) | INTRAMUSCULAR | Status: DC | PRN

## 2012-06-24 MED ORDER — RAMIPRIL 2.5 MG PO CAPS
2.5000 mg | ORAL_CAPSULE | Freq: Every day | ORAL | Status: DC
  Administered 2012-06-25: 2.5 mg via ORAL
  Filled 2012-06-24 (×2): qty 1

## 2012-06-24 MED ORDER — ACETAMINOPHEN 325 MG PO TABS: 650.0000 mg | ORAL_TABLET | Freq: Four times a day (QID) | ORAL | Status: DC | PRN

## 2012-06-24 MED ORDER — METFORMIN HCL 500 MG PO TABS
500.0000 mg | ORAL_TABLET | Freq: Two times a day (BID) | ORAL | Status: DC
  Administered 2012-06-24 – 2012-06-26 (×5): 500 mg via ORAL
  Filled 2012-06-24 (×5): qty 1

## 2012-06-24 MED ORDER — SODIUM CHLORIDE 0.9 % IJ SOLN
3.0000 mL | Freq: Two times a day (BID) | INTRAMUSCULAR | Status: DC
  Administered 2012-06-24: 10:00:00 via INTRAVENOUS
  Administered 2012-06-24 – 2012-06-25 (×3): 3 mL via INTRAVENOUS

## 2012-06-24 MED ORDER — ALBUTEROL SULFATE (5 MG/ML) 0.5% IN NEBU: 2.5000 mg | INHALATION_SOLUTION | Freq: Four times a day (QID) | RESPIRATORY_TRACT | Status: DC | PRN

## 2012-06-24 MED ORDER — IPRATROPIUM BROMIDE 0.02 % IN SOLN: 0.5000 mg | Freq: Four times a day (QID) | RESPIRATORY_TRACT | Status: DC | PRN

## 2012-06-24 MED ORDER — HYDROMORPHONE HCL PF 1 MG/ML IJ SOLN: 0.5000 mg | INTRAMUSCULAR | Status: DC | PRN

## 2012-06-24 MED ORDER — POTASSIUM CHLORIDE CRYS ER 10 MEQ PO TBCR
10.0000 meq | EXTENDED_RELEASE_TABLET | Freq: Every day | ORAL | Status: DC
  Administered 2012-06-24: 10 meq via ORAL
  Filled 2012-06-24: qty 1

## 2012-06-24 MED ORDER — TAMSULOSIN HCL 0.4 MG PO CAPS
0.4000 mg | ORAL_CAPSULE | Freq: Every day | ORAL | Status: DC
  Administered 2012-06-24 – 2012-06-25 (×2): 0.4 mg via ORAL
  Filled 2012-06-24 (×2): qty 1

## 2012-06-24 MED ORDER — FLEET ENEMA 7-19 GM/118ML RE ENEM: 1.0000 | ENEMA | Freq: Once | RECTAL | Status: AC | PRN

## 2012-06-24 MED ORDER — TICAGRELOR 90 MG PO TABS
90.0000 mg | ORAL_TABLET | Freq: Two times a day (BID) | ORAL | Status: DC
  Administered 2012-06-24 – 2012-06-26 (×5): 90 mg via ORAL
  Filled 2012-06-24 (×9): qty 1

## 2012-06-24 MED ORDER — DOCUSATE SODIUM 100 MG PO CAPS
100.0000 mg | ORAL_CAPSULE | Freq: Every day | ORAL | Status: DC
  Administered 2012-06-24 – 2012-06-25 (×2): 100 mg via ORAL
  Filled 2012-06-24 (×3): qty 1

## 2012-06-24 MED ORDER — FUROSEMIDE 20 MG PO TABS
20.0000 mg | ORAL_TABLET | Freq: Every day | ORAL | Status: DC
  Administered 2012-06-24: 20 mg via ORAL
  Filled 2012-06-24: qty 1

## 2012-06-24 MED ORDER — RAMIPRIL 10 MG PO CAPS
10.0000 mg | ORAL_CAPSULE | Freq: Every day | ORAL | Status: DC
Start: 2012-06-24 — End: 2012-06-24
  Administered 2012-06-24: 10 mg via ORAL
  Filled 2012-06-24: qty 1

## 2012-06-24 MED ORDER — NITROFURANTOIN MACROCRYSTAL 100 MG PO CAPS
100.0000 mg | ORAL_CAPSULE | Freq: Every day | ORAL | Status: DC
  Administered 2012-06-24 – 2012-06-25 (×2): 100 mg via ORAL
  Filled 2012-06-24 (×5): qty 1

## 2012-06-24 MED ORDER — CARVEDILOL 3.125 MG PO TABS
6.2500 mg | ORAL_TABLET | Freq: Two times a day (BID) | ORAL | Status: DC
  Administered 2012-06-24 – 2012-06-26 (×3): 6.25 mg via ORAL
  Filled 2012-06-24 (×4): qty 2

## 2012-06-24 MED ORDER — ONDANSETRON HCL 4 MG PO TABS: 4.0000 mg | ORAL_TABLET | Freq: Four times a day (QID) | ORAL | Status: DC | PRN

## 2012-06-24 MED ORDER — ACETAMINOPHEN 650 MG RE SUPP: 650.0000 mg | Freq: Four times a day (QID) | RECTAL | Status: DC | PRN

## 2012-06-24 MED ORDER — SODIUM CHLORIDE 0.9 % IV BOLUS (SEPSIS)
200.0000 mL | Freq: Once | INTRAVENOUS | Status: AC
  Administered 2012-06-24: 200 mL via INTRAVENOUS

## 2012-06-24 MED ORDER — CARVEDILOL 3.125 MG PO TABS
6.2500 mg | ORAL_TABLET | Freq: Two times a day (BID) | ORAL | Status: DC
  Administered 2012-06-24: 6.25 mg via ORAL
  Filled 2012-06-24: qty 2

## 2012-06-24 MED ORDER — ASPIRIN 81 MG PO CHEW
81.0000 mg | CHEWABLE_TABLET | Freq: Every day | ORAL | Status: DC
  Administered 2012-06-24 – 2012-06-26 (×3): 81 mg via ORAL
  Filled 2012-06-24 (×3): qty 1

## 2012-06-24 MED ORDER — INSULIN ASPART 100 UNIT/ML ~~LOC~~ SOLN: 0.0000 [IU] | Freq: Every day | SUBCUTANEOUS | Status: DC

## 2012-06-24 MED ORDER — OXYCODONE HCL 5 MG PO TABS: 5.0000 mg | ORAL_TABLET | ORAL | Status: DC | PRN

## 2012-06-24 NOTE — Progress Notes (Signed)
[facility] PROGRESS NOTE  Jeffrey [name] ZOX:[id number] DOB: [birth year] DOA: 06/23/2012 PCP: [name], MD  Assessment/Plan:  Progressive weakness and fatigue Found to have ortho stasis on standing 12/12.  Fluids given.  Cardiology has adjusted BP meds. PT / OT to evaluate.  Patient may need skilled rehab vs home health services.  Lives alone. Will monitor for ortho stasis each shift.  Obtain PT/OT recs and likely d/c 12/13 if improved.  Elevated Troponin Felt by cards to be still trending down from recent MI for which he was treated at [facility] (d/c 12/9)  Chest Pain Resolved.  The patient questioned if it was indigestion (he typically takes tums after every meal) Cards Consult Appreciated. Cardiac Enzymes stable (not trending up) EKG abnormal but improved from 12/8 tracing.  Diabetes On Metformin BID at home.  Will place on SSI-moderate in house with a carb modified diet  Chronic neck/back pain Stable  Code Status: Full Code Family Communication:  Disposition Plan: to be determined   Consultants:  Cardiology  Procedures:    Antibiotics:    HPI/Subjective: Jeffrey [name] is an 78 y.o. male. Who has a history of significant CAD, as well as stroke.  Caucasian gentleman lives alone discharged from [facility] 2 days ago after the management of ST elevation MI with drug-eluting stents. Presents to our emergency room complaining of progressive weakness and not feeling well since discharge. Also reports an episode of chest pain which is now resolved after nitroglycerin at home.   Objective: Filed Vitals:   06/24/12 0850 06/24/12 1008 06/24/12 1009 06/24/12 1010  BP: 108/53 99/55 76/51  75/46  Pulse: 64 80 87 99  Temp: 98.1 F (36.7 C)     TempSrc: Oral     Resp: 20 20 20 20   Height:      Weight:      SpO2: 97% 99% 100% 99%    Intake/Output Summary (Last 24 hours) at 06/24/12 1213 Last data filed at 06/24/12 1031  Gross per 24 hour   Intake    560 ml  Output    200 ml  Net    360 ml   Filed Weights   06/24/12 0111  Weight: 79.652 kg (175 lb 9.6 oz)    Exam:   General:  A&O, NAD, Talkative  Cardiovascular: RRR no obvious M/R/G  Respiratory: No W/C/R, no accessory muscle use  Abdomen: Soft, nt, nd, + BS, no masses.  Extremities:  No Lower Extremity Edema.  Data Reviewed: Basic Metabolic Panel:  Lab 06/24/12 8119 06/23/12 1945 06/20/12 0600 06/19/12 0545 06/18/12 1510  NA 136 136 138 137 136  K 4.1 3.9 3.9 3.7 4.0  CL 100 100 103 101 100  CO2 27 23 26 24 24   GLUCOSE 177* 212* 211* 191* 209*  BUN 15 16 13 11 14   CREATININE 1.06 1.06 1.03 0.92 1.10  CALCIUM 9.5 9.5 9.2 9.1 9.5  MG -- -- -- -- --  PHOS -- -- -- -- --   Liver Function Tests:  Lab 06/19/12 0545 06/18/12 1510  AST 35 29  ALT 22 26  ALKPHOS 65 59  BILITOT 0.4 0.2*  PROT 6.4 6.4  ALBUMIN 3.5 3.6   CBC:  Lab 06/24/12 0517 06/23/12 1945 06/20/12 0600 06/19/12 0545 06/18/12 1510  WBC 7.1 7.3 6.5 8.2 8.2  NEUTROABS -- 4.8 -- -- --  HGB 12.5* 12.4* 11.4* 11.6* 11.7*  HCT 37.3* 36.9* 34.7* 35.6* 34.9*  MCV 90.1 89.6 89.2 89.2 90.4  PLT  176 179 151 163 171   Cardiac Enzymes:  Lab 06/24/12 0517 06/23/12 2238 06/23/12 1945 06/19/12 0545 06/18/12 2352  CKTOTAL -- -- -- -- --  CKMB -- -- -- -- --  CKMBINDEX -- -- -- -- --  TROPONINI <0.30 0.31* 0.31* 7.38* 5.89*   BNP (last 3 results)  Basename 04/17/12 1521 03/22/12 0445 03/21/12 1821  PROBNP 438.7 1257.0* 1387.0*   CBG:  Lab 06/24/12 1141 06/24/12 0727 06/24/12 0149 06/21/12 1132 06/21/12 0726  GLUCAP 162* 171* 153* 210* 201*    Recent Results (from the past 240 hour(s))  MRSA PCR SCREENING     Status: Normal   Collection Time   06/18/12  5:23 PM      Component Value Range Status Comment   MRSA by PCR NEGATIVE  NEGATIVE Final      Studies: Dg Chest Port 1 View  06/23/2012  *RADIOLOGY REPORT*  Clinical Data: Weakness  PORTABLE CHEST - 1 VIEW  Comparison: None.   Findings: Postop CABG.  Negative for heart failure.  Mild left lower lobe airspace disease may represent atelectasis or pneumonia, unchanged.  Right lung is clear.  Negative for pleural effusion.  IMPRESSION: Mild left lower lobe atelectasis or infiltrate.  This is unchanged.   Original Report Authenticated By: Janeece Riggers, M.D.     Scheduled Meds:    . aspirin  81 mg Oral Daily  . atorvastatin  80 mg Oral q1800  . carvedilol  6.25 mg Oral BID WC  . docusate sodium  100 mg Oral QHS  . enoxaparin (LOVENOX) injection  40 mg Subcutaneous Q24H  . insulin aspart  0-15 Units Subcutaneous TID WC  . insulin aspart  0-5 Units Subcutaneous QHS  . metFORMIN  500 mg Oral BID WC  . nitrofurantoin  100 mg Oral QHS  . omega-3 acid ethyl esters  1,000 mg Oral Daily  . pantoprazole  40 mg Oral Q breakfast  . ramipril  2.5 mg Oral Daily  . [COMPLETED] sodium chloride  200 mL Intravenous Once  . sodium chloride  3 mL Intravenous Q12H  . Tamsulosin HCl  0.4 mg Oral QHS  . Ticagrelor  90 mg Oral BID  . [DISCONTINUED] carvedilol  6.25 mg Oral BID WC  . [DISCONTINUED] furosemide  20 mg Oral Daily  . [DISCONTINUED] potassium chloride  10 mEq Oral Daily  . [DISCONTINUED] ramipril  10 mg Oral Daily   Continuous Infusions:   Active Problems:  Hyperlipidemia  Dementia  Diabetes mellitus, type II  Hypertension  Cardiomyopathy, ischemic  Chronic combined systolic and diastolic CHF (congestive heart failure)  Fatigue  Weakness  Hyperglycemia  Chest pain  Orthostatic hypotension    Time spent: 30 min    Conley Canal [facility] Pager [phone]. If 8PM-8AM, please contact night-coverage at [email or web address], password [facility] 06/24/2012, 12:13 PM  LOS: 1 day    Chart reviewed. Patient interviewed and examined. He still feels weak. No chest pain. No shortness of breath. No other symptoms. Symptoms secondary to orthostatic hypotension. Troponin now normal.  Crista Curb,  M.D.

## 2012-06-24 NOTE — Evaluation (Signed)
Occupational Therapy Evaluation Patient Details Name: Jeffrey [name] MRN: [id number] DOB: 10-25-1928 Today's Date: 06/24/2012 Time: 7829-5621 OT Time Calculation (min): 12 min  OT Assessment / Plan / Recommendation Clinical Impression  Patient is a 78 y/o male s/p fatigue presenting to acute OT with all education complete. Patient was given handout on energy conservation strategies to utilize at home. Patient is functioning at baseline and does not require any assistance with ADL. Patient does not require OT services at this time; will sign off.    OT Assessment  Patient does not need any further OT services    Follow Up Recommendations  No OT follow up       Equipment Recommendations  None recommended by OT          Precautions / Restrictions Precautions Precautions: None Restrictions Weight Bearing Restrictions: No   Pertinent Vitals/Pain No complaints.    ADL  Upper Body Dressing: Simulated;Independent Where Assessed - Upper Body Dressing: Unsupported sitting Lower Body Dressing: Simulated;Independent Where Assessed - Lower Body Dressing: Unsupported sitting Transfers/Ambulation Related to ADLs: Patient transfers at a Independent level without device.      Visit Information  Last OT Received On: 06/24/12 Assistance Needed: +1    Subjective Data  Subjective: "I am starting to get stronger." Patient Stated Goal: To go home.   Prior Functioning     Home Living Lives With: Alone Available Help at Discharge: Family Type of Home: Apartment Home Access: Level entry Home Layout: One level Bathroom Shower/Tub: Network engineer: None Prior Function Level of Independence: Independent Able to Take Stairs?: Yes Driving: Yes Vocation: Retired Musician: No difficulties Dominant Hand: Right            Cognition  Overall Cognitive Status: Appears within functional limits for tasks  assessed/performed Arousal/Alertness: Awake/alert Orientation Level: Appears intact for tasks assessed Behavior During Session: [facility] for tasks performed    Extremity/Trunk Assessment Right Upper Extremity Assessment RUE ROM/Strength/Tone: Within functional levels (MMT: 5/5) Left Upper Extremity Assessment LUE ROM/Strength/Tone: Within functional levels (MMT: 5/5)     Mobility  Transfers Sit to Stand: 7: Independent;From bed Stand to Sit: 7: Independent;To bed           Balance Balance Balance Assessed: No   End of Session OT - End of Session Activity Tolerance: Patient tolerated treatment well Patient left: in bed;with call bell/phone within reach  GO Functional Assessment Tool Used: observation Functional Limitation: Self care Self Care Current Status (H0865): At least 1 percent but less than 20 percent impaired, limited or restricted Self Care Goal Status (H8469): At least 1 percent but less than 20 percent impaired, limited or restricted Self Care Discharge Status [phone]): At least 1 percent but less than 20 percent impaired, limited or restricted   Limmie Patricia, OTR/L 06/24/2012, 1:47 PM

## 2012-06-24 NOTE — Progress Notes (Signed)
UR Chart Review Completed  

## 2012-06-24 NOTE — Evaluation (Signed)
Physical Therapy Evaluation Patient Details Name: Jeffrey [name] MRN: [id number] DOB: 1928/08/04 Today's Date: 06/24/2012 Time: 4098-1191 PT Time Calculation (min): 29 min  PT Assessment / Plan / Recommendation Clinical Impression  Pt was found to be at prior functional level upon eval and no PT is needed.    PT Assessment  Patent does not need any further PT services    Follow Up Recommendations  No PT follow up    Does the patient have the potential to tolerate intense rehabilitation      Barriers to Discharge        Equipment Recommendations  None recommended by PT    Recommendations for Other Services     Frequency      Precautions / Restrictions Precautions Precautions: None Restrictions Weight Bearing Restrictions: No   Pertinent Vitals/Pain       Mobility  Bed Mobility Bed Mobility: Supine to Sit;Sit to Supine Supine to Sit: 7: Independent Sit to Supine: 7: Independent Transfers Transfers: Sit to Stand;Stand to Sit Sit to Stand: 7: Independent Stand to Sit: 7: Independent Ambulation/Gait Ambulation/Gait Assistance: 7: Independent Ambulation Distance (Feet): 100 Feet Assistive device: None Gait Pattern: Within Functional Limits Gait velocity: WNL Stairs: No Wheelchair Mobility Wheelchair Mobility: No    Shoulder Instructions     Exercises     PT Diagnosis:    PT Problem List:   PT Treatment Interventions:     PT Goals    Visit Information  Last PT Received On: 06/24/12    Subjective Data  Subjective: I still feel weak Patient Stated Goal: return home   Prior Functioning  Home Living Lives With: Alone Available Help at Discharge: Family Type of Home: Apartment Home Access: Level entry Home Layout: One level Bathroom Shower/Tub: Engineer, manufacturing systems: Standard Home Adaptive Equipment: None Prior Function Level of Independence: Independent Able to Take Stairs?: Yes Driving: No Vocation:  Retired Musician: No difficulties    Cognition  Overall Cognitive Status: Appears within functional limits for tasks assessed/performed Arousal/Alertness: Awake/alert Orientation Level: Appears intact for tasks assessed Behavior During Session: [facility] for tasks performed    Extremity/Trunk Assessment Right Lower Extremity Assessment RLE ROM/Strength/Tone: Within functional levels RLE Sensation: WFL - Light Touch RLE Coordination: WFL - gross motor Left Lower Extremity Assessment LLE ROM/Strength/Tone: Within functional levels LLE Sensation: WFL - Light Touch LLE Coordination: WFL - gross motor Trunk Assessment Trunk Assessment: Normal   Balance Balance Balance Assessed: No  End of Session PT - End of Session Equipment Utilized During Treatment: Gait belt Activity Tolerance: Patient tolerated treatment well Patient left: in bed;with call bell/phone within reach  GP Functional Assessment Tool Used: clinical judgement Functional Limitation: Mobility: Walking and moving around Mobility: Walking and Moving Around Current Status [phone]): 0 percent impaired, limited or restricted Mobility: Walking and Moving Around Goal Status [phone]): 0 percent impaired, limited or restricted Mobility: Walking and Moving Around Discharge Status [phone]): 0 percent impaired, limited or restricted   Myrlene Broker L 06/24/2012, 12:19 PM

## 2012-06-24 NOTE — ED Provider Notes (Signed)
D/w cardiologist dr Adolm Joseph He reviewed chart and his troponin - his troponin is unchanged He feels he can stay at [facility] and see cardiology in the AM Pt denies any complaints, no distress.  He denies CP Will admit to medicine BP 114/57  Pulse 83  Temp 97.8 F (36.6 C) (Oral)  Resp 18  SpO2 97%   Joya Gaskins, MD 06/24/12 0006

## 2012-06-24 NOTE — Consult Note (Signed)
CARDIOLOGY CONSULT NOTE  Patient ID: Jeffrey [name], MRN: [id number], DOB/AGE: [birth year] 78 y.o. Admit date: 06/23/2012   Date of Consult: 06/24/2012 Primary Physician: [name], MD Primary Cardiologist: Carina Chaplin  Chief Complaint: weakness Reason for Consult: weakness  HPI: Jeffrey [name] is an 78 y/o M with history of CVA, DM, h/o postural dizziness, and CAD s/p multiple PCI who was discharged 12/9 following admission for inferior STEMI treated with DES to proximal SVG-ramus. EF was noted at 40% post-MI, previously 50-55%. He was placed on ASA/Brilinta. Ramipril was increased from 5 to 10mg  daily and furosemide was added to his medical regime. He returned overnight with intermittent weakness. Since discharge, the patient has noticed intermittent dizziness/weakness that seems to especially occur upon standing. He has to take his time standing up, otherwise he feels tired.  He relates this change to ever since he started taking the increased dose of ramipril. He has noticed this even this morning - he states he felt well until he received this medicine a short while ago. He denies any palpitations, CP, SOB, or exertional/inspiration-related symptoms since discharge. No nausea, vomiting, diarrhea, weight change, LEE, orthopnea, syncope, or PND. BP has been low-normal, HR stable. Overnight, troponins 0.31->0.31-><0.30 (ref range <0.30 = WNL - Troponin was 7.38 on 06/19/12). Hgb stable, glucose 150-200s. CXR with mild LLL atx or infiltrate. Afebrile and normal WBC, no cough or fever/chills. EKG shows improving evolutionary ST-T changes compared to 12/8 tracing. Tele with NSR, occasional PVCs/couplets.  Orthostatics this AM: Lying: 99/55, HR 80 Sitting: 76/51, HR 87 Standing: 75/45 (associated with dizziness), HR 99  Past Medical History  Diagnosis Date  . Diabetes mellitus, type II   . Hyperlipidemia     Lipid profile in 02/2012:135, 227, 41, 49  . Arteriosclerotic cardiovascular disease  (ASCVD)     a. CABG x 4 in 1989 (VG->OM1->OM2, VG->RCA, LIMA->LAD), b. 05/2010: DES to VG-OM1/OM2, DES to distal LCx. c. NSTEMI in 04/2011 - TO distal LCX stent and VG->OM2. d. 02/2012 NSTEMI DES to VG-OM1/continuation to OM2 occluded. e. inferior STEMI s/p DES to [facility] 06/2012.  Marland Kitchen Hypertension   . Osteoarthritis   . CVA (cerebral infarction)     details unclear  . Pneumonia   . Cervical vertebral fracture   . Chronic back pain   . Benign prostatic hypertrophy     Required urinary catheter x4 weeks for retention  . Peptic ulcer disease   . Gastroesophageal reflux disease   . Ischemic cardiomyopathy     EF 40% 06/2012 (previously 50-55% 03/2012)      Most Recent Cardiac Studies: 2D Echo 06/19/2012 Study Conclusions  - Left ventricle: Severe hypokinesis of inferior wall and inferolateral wall. The cavity size was moderately dilated. The estimated ejection fraction was 40%. Doppler parameters are consistent with abnormal left ventricular relaxation (grade 1 diastolic dysfunction). - Aortic valve: Sclerosis without stenosis. - Mitral valve: Calcified annulus. - Left atrium: The atrium was moderately dilated. - Right ventricle: Systolic function was mildly reduced. - Right atrium: The atrium was mildly dilated.  Cardiac Cath 06/2012 PROCEDURAL FINDINGS  Hemodynamics:  AO 121/72 with a mean of 95 mmHg  LV N/A  Coronary angiography:  Coronary dominance: right  Left mainstem: The left main coronary is heavily calcified. It is diffusely narrowed up to 30-40%.  Left anterior descending (LAD): The LAD is also moderate to severely calcified in the proximal vessel. There is a 90% stenosis proximally. This is followed by diffuse disease in the mid vessel up to  50%. The distal LAD fills by the IMA graft. There are 2 small diagonal branches which are diffusely diseased.  The ramus intermediate branch is occluded.  Left circumflex (LCx): The left circumflex is occluded distally. The first  obtuse marginal vessel is occluded.  Right coronary artery (RCA): The native right coronary is severely diseased proximally up to 80%. In the mid vessel is occluded. There are right to right collaterals to a large right ventricular marginal branch. The distal right coronary supplied by extensive left-to-right collaterals.  The saphenous vein graft to the ramus intermediate branch has a 95% stenosis proximally within the site of a previous stent. The continuation of his vein graft to the OM is occluded.  Saphenous vein graft to the right coronary is occluded.  The LIMA graft to the LAD is widely patent.  Left ventriculography: Not performed.  PCI Procedure Note: Following the diagnostic procedure, the decision was made to proceed with PCI of the saphenous vein graft to the intermediate. In 2011 this proximal graft was stented with a 3.0 mm Promus stent. In August 2013 a lesion just distal to the stent was stented with a 2.75 mm resolute stent in an overlapping fashion. The current lesion involves the very proximal stented site. There is also disease extending to the ostium of the vein graft. Weight-based bivalirudin was given for anticoagulation. Once a therapeutic ACT was achieved, a 6 [place] FR4 guide with sideholes catheter was inserted. A pro-water coronary guidewire was used to cross the lesion. The lesion was predilated with a 2.5 mm and then a 3.0 mm balloon. The lesion was then stented with a 3.0 x 12 mm Promus premier stent extending to the ostium. The stent was postdilated with a 3.0 mm noncompliant balloon. Following PCI, there was 0% residual stenosis and TIMI-3 flow. Final angiography confirmed an excellent result. Femoral hemostasis was achieved with manual compression. The patient tolerated the PCI procedure well. There were no immediate procedural complications. The patient was transferred to the post catheterization recovery area for further monitoring.  PCI Data:  Vessel - saphenous vein  graft to the intermediate/Segment - proximal  Percent Stenosis (pre) 95%  TIMI-flow 2  Stent 3.0 x 12 mm Promus premier  Percent Stenosis (post) 0%  TIMI-flow (post) 3  Final Conclusions:  1. Severe three-vessel obstructive coronary disease.  2. 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 Patent LIMA graft to the LAD  4. Critical stenosis in the saphenous vein graft to the ramus intermediate branch  5. Successful stenting of the saphenous vein graft to the ramus intermediate branch with a drug-eluting stent  Recommendations: Continue dual antiplatelet therapy indefinitely.    Surgical History:  Past Surgical History  Procedure Date  . Coronary angioplasty with stent placement     2011, 02/2012  . Tonsillectomy   . Coronary artery bypass graft     Home Meds: Prior to Admission medications   Medication Sig Start Date End Date Taking? Authorizing Provider  albuterol (PROVENTIL HFA;VENTOLIN HFA) 108 (90 BASE) MCG/ACT inhaler Inhale 2 puffs into the lungs every 6 (six) hours as needed. For shortness of breath   Yes Historical Provider, MD  aspirin 81 MG chewable tablet Chew 1 tablet (81 mg total) by mouth daily. 06/21/12  Yes Roger A Arguello, PA-C  atorvastatin (LIPITOR) 80 MG tablet Take 1 tablet (80 mg total) by mouth daily at 6 PM. 06/21/12  Yes Roger A Arguello, PA-C  B Complex-Biotin-FA (B-COMPLEX PO) Take 1  tablet by mouth daily.    Yes Historical Provider, MD  calcium carbonate (TUMS - DOSED IN MG ELEMENTAL CALCIUM) 500 MG chewable tablet Chew 1 tablet by mouth 4 (four) times daily - after meals and at bedtime.   Yes Historical Provider, MD  carvedilol (COREG) 6.25 MG tablet Take 6.25 mg by mouth 2 (two) times daily with a meal.   Yes Historical Provider, MD  Docusate Sodium (STOOL SOFTENER) 100 MG capsule Take 100 mg by mouth at bedtime. Constipation   Yes Historical Provider, MD  fish oil-omega-3 fatty acids 1000 MG capsule Take 1 g by mouth daily.   Yes Historical  Provider, MD  furosemide (LASIX) 20 MG tablet Take 1 tablet (20 mg total) by mouth daily. 06/21/12  Yes Roger A Arguello, PA-C  metFORMIN (GLUCOPHAGE) 500 MG tablet Take 500 mg by mouth 2 (two) times daily. 03/12/12  Yes Ok Anis, NP  Multiple Vitamin (MULTIVITAMIN WITH MINERALS) TABS Take 1 tablet by mouth daily.   Yes Historical Provider, MD  nitrofurantoin (MACRODANTIN) 100 MG capsule Take 100 mg by mouth at bedtime.   Yes Historical Provider, MD  nitroGLYCERIN (NITROSTAT) 0.4 MG SL tablet Place 1 tablet (0.4 mg total) under the tongue every 5 (five) minutes x 3 doses as needed for chest pain. 03/12/12 03/12/13 Yes Ok Anis, NP  pantoprazole (PROTONIX) 40 MG tablet Take 40 mg by mouth every morning. 12/17/11  Yes Christiane Ha, MD  potassium chloride (K-DUR,KLOR-CON) 10 MEQ tablet Take 1 tablet (10 mEq total) by mouth daily. 06/21/12  Yes Roger A Arguello, PA-C  ramipril (ALTACE) 10 MG capsule Take 1 capsule (10 mg total) by mouth daily. 06/08/12  Yes Kathlen Brunswick, MD  saxagliptin HCl (ONGLYZA) 5 MG TABS tablet Take 5 mg by mouth daily at 12 noon.    Yes Historical Provider, MD  Tamsulosin HCl (FLOMAX) 0.4 MG CAPS Take 0.4 mg by mouth at bedtime. For urinary health   Yes Historical Provider, MD  Ticagrelor (BRILINTA) 90 MG TABS tablet Take 1 tablet (90 mg total) by mouth 2 (two) times daily. 06/21/12  Yes Gery Pray, PA-C   Inpatient Medications:     . aspirin  81 mg Oral Daily  . atorvastatin  80 mg Oral q1800  . carvedilol  6.25 mg Oral BID WC  . docusate sodium  100 mg Oral QHS  . enoxaparin (LOVENOX) injection  40 mg Subcutaneous Q24H  . furosemide  20 mg Oral Daily  . insulin aspart  0-15 Units Subcutaneous TID WC  . insulin aspart  0-5 Units Subcutaneous QHS  . metFORMIN  500 mg Oral BID WC  . nitrofurantoin  100 mg Oral QHS  . omega-3 acid ethyl esters  1,000 mg Oral Daily  . pantoprazole  40 mg Oral Q breakfast  . potassium chloride  10 mEq Oral  Daily  . ramipril  10 mg Oral Daily  . sodium chloride  3 mL Intravenous Q12H  . Tamsulosin HCl  0.4 mg Oral QHS  . Ticagrelor  90 mg Oral BID   Allergies: No Known Allergies  History   Social History  . Marital Status: Divorced    Spouse Name: N/A    Number of Children: N/A  . Years of Education: N/A   Occupational History  . Retired     Probation officer   Social History Main Topics  . Smoking status: Former Smoker -- 2.0 packs/day for 10 years    Types: Cigarettes  Quit date: 07/14/1961  . Smokeless tobacco: Current User    Types: Chew  . Alcohol Use: No  . Drug Use: No  . Sexually Active: No   Other Topics Concern  . Not on file   Social History Narrative   ** Merged History Encounter **     Family History  Problem Relation Age of Onset  . Early death      Parents died young, unclear reasons    Review of Systems: General: negative for chills, fever, night sweats or weight changes.  Cardiovascular: see above Dermatological: negative for rash Respiratory: negative for cough or wheezing Urologic: negative for hematuria Abdominal: negative for nausea, vomiting, diarrhea, bright red blood per rectum, melena, or hematemesis Neurologic: negative for visual changes, syncope All other systems reviewed and are otherwise negative except as noted above.  Labs:  [facility] 06/24/12 0517 06/23/12 2238 06/23/12 1945  CKTOTAL -- -- --  CKMB -- -- --  TROPONINI <0.30 0.31* 0.31*   Lab Results  Component Value Date   WBC 7.1 06/24/2012   HGB 12.5* 06/24/2012   HCT 37.3* 06/24/2012   MCV 90.1 06/24/2012   PLT 176 06/24/2012    Lab 06/24/12 0517 06/19/12 0545  NA 136 --  K 4.1 --  CL 100 --  CO2 27 --  BUN 15 --  CREATININE 1.06 --  CALCIUM 9.5 --  PROT -- 6.4  BILITOT -- 0.4  ALKPHOS -- 65  ALT -- 22  AST -- 35  GLUCOSE 177* --   Lab Results  Component Value Date   CHOL 113 06/19/2012   HDL 40 06/19/2012   LDLCALC 40 06/19/2012   TRIG 165* 06/19/2012    Radiology/Studies:  Dg Chest Port 1 View 06/23/2012 Postop CABG. Mild left lower lobe atelectasis or infiltrate-unchanged.     EKG:  06/23/12: NSR 83bpm, inferior Q's III, inferolateral ST-T abnormalities improved from 12/8 tracing 06/24/12: NSR 83bpm, IRBBB, inferior Q's III,  inferolateral ST-T abnormalities improved from 12/8 tracing  Physical Exam: Blood pressure 108/53, pulse 64, temperature 98.1 F (36.7 C), temperature source Oral, resp. rate 20, height 5\' 10"  (1.778 m), weight 175 lb 9.6 oz (79.652 kg), SpO2 97.00%. General: Well developed, well nourished WM in no acute distress. Head: Normocephalic, atraumatic, sclera non-icteric, no xanthomas, nares are without discharge.  Neck: Negative for carotid bruits. JVD not elevated. Multiple skin tags noted. Lungs: Few basilar rales. Breathing is unlabored. Heart: RRR with S1 S2. No murmurs, rubs, or gallops appreciated. Abdomen: Soft, non-tender, non-distended with normoactive bowel sounds. No hepatomegaly. No rebound/guarding. No obvious abdominal masses. Msk:  Strength and tone appear normal for age. Extremities: No clubbing or cyanosis. No edema.  Distal pedal pulses are 2+ and equal bilaterally.  Neuro: Alert and oriented X 3. No facial asymmetry. No focal deficit. Moves all extremities spontaneously. Psych:  Responds to questions appropriately with a normal affect.   Assessment and Plan:  1. Orthostatic hypotension 2. CAD s/p multiple PCI with recent inferior STEMI s/p DES to SVG-ramus 06/18/12 3. Elevated troponin likely downtrend from #1 4. ICM EF 40% 5. H/o HTN 6. Diabetes mellitus 7. PVCs  Already received his AM anti-hypertensives. Decrease ramipril to 2.5mg  starting tomorrow.Will d/c Lasix for now. Will give low dose 200cc IVF bolus this AM as he may be on dry side. Continue other home medications, inculding BB to help with PVCs. Borderline troponin earlier was likely downtrend from recent MI. Doubt ACS. Dr. Dietrich Pates to  follow.  Dayna Dunn PA-C  06/24/2012, 9:50 AM  Cardiology Attending Patient interviewed and examined. Discussed with Dayna Dunn PA-C.  Above note annotated and modified based upon my findings.  Current problems are almost certainly medication related. Blood pressure has improved substantially with adjustment of his medical regime. If he is not significantly orthostatic tomorrow morning, he can likely be discharged on a modified schedule of medications.  [place], MD 06/24/2012, 4:59 PM

## 2012-06-24 NOTE — Care Management Note (Unsigned)
    Page 1 of 1   06/24/2012     2:37:21 PM   CARE MANAGEMENT NOTE 06/24/2012  Patient:  Jeffrey [name], Jeffrey [name]   Account Number:  [id number]  Date Initiated:  06/24/2012  Documentation initiated by:  Rosemary Holms  Subjective/Objective Assessment:   Pt admitted from home where he lives alone. Does not have any family to assist him at this time. Used [facility] in the past and may need an Charity fundraiser to see him at DC.     Action/Plan:   Anticipated DC Date:  06/25/2012   Anticipated DC Plan:  HOME W HOME HEALTH SERVICES      DC Planning Services  CM consult      Choice offered to / List presented to:             Status of service:  In process, will continue to follow Medicare Important Message given?   (If response is "NO", the following Medicare IM given date fields will be blank) Date Medicare IM given:   Date Additional Medicare IM given:    Discharge Disposition:    Per UR Regulation:    If discussed at Long Length of Stay Meetings, dates discussed:    Comments:  06/24/12 1430 Jakyron Fabro Leanord Hawking RN BSN CM Previously with [facility].

## 2012-06-24 NOTE — Progress Notes (Signed)
Brief Cardiology Note Called by ER at [facility] regarding patient presenting with complaints of weakness and intermittent chest pain. 78 yo male with known CAD s/p CABG who had inferior STEMI on 12/6 and underwent emergent catheterization finding SVG to ramus with culprit lesion and was stented without reported complication. Noted troponin rose to 7.4 on 12/7 (unclear if peak as it was still rising).  Now presenting to [facility], troponin of 0.31, repeat 3 hrs later of 0.31. Chest pain free by report. EKG with evolving changes post STEMI. Troponin elevation likely due to prior MI from 4 days prior and does not represent new event. Pt continues to be hemodynamically stable and chest pain free. No indication for urgent transfer from [facility]. Will have cardiology see patient in the AM after admitted to hospitalist service. Continue current cardiac medication regimen including dual antiplatelet meds. Discussed with ER staff who is in agreement. Admitting team to call if any questions, concerns.

## 2012-06-24 NOTE — H&P (Signed)
[facility] History and Physical  JADIAN KARMAN  ZOX:[id number]  DOB: [birth year]   DOA: 06/24/2012   PCP:   [name], MD   Chief Complaint:  Chest pain and weakness for the past 2 days  HPI: Jeffrey [name] is an 78 y.o. male.   Caucasian gentleman lives alone discharged from [facility] 2 days ago after the management of ST elevation MI with drug-eluting stents. Presents to our emergency room complaining of progressive weakness and not feeling well since discharge. Also reports an episode of chest pain which is now resolved after nitroglycerin at home.  In the emergency room his troponins were noted to be 0.31, a cardiologist at [facility] were contacted who recommended recommended a repeat troponins after a few hours, and when this troponins returned unchanged, they felt the patient was not having acute myocardial event and could be observed overnight to [facility] to be seen by cardiologist this morning.  Other than generalized weakness patient has no other acute.  This patient is well-known to the hospitalist service, and is currently looking very much at his baseline, and notably although mildly obese was able to pull himself up into an upright position for examination without any assistance, and without showing signs of distress.   Rewiew of Systems:   All systems negative except as marked bold or noted in the HPI;  Constitutional: Negative for malaise, fever and chills. ;  Eyes: Negative for eye pain, redness and discharge. ;  ENMT: Negative for ear pain, hoarseness, nasal congestion, sinus pressure and sore throat. ;  Cardiovascular: Negative for , palpitations, diaphoresis, dyspnea and peripheral edema. ;  Respiratory: Negative for cough, hemoptysis, wheezing and stridor. ;  Gastrointestinal: Negative for nausea, vomiting, diarrhea, constipation, abdominal pain, melena, blood in stool, hematemesis, jaundice and rectal bleeding. unusual weight  loss..   Genitourinary: Negative for frequency, dysuria, incontinence,flank pain and hematuria; Musculoskeletal: Negative for back pain and neck pain. Negative for swelling and trauma.;  Skin: . Negative for pruritus, rash, abrasions, bruising and skin lesion.; ulcerations Neuro: Negative for headache, lightheadedness and neck stiffness. Negative for weakness, altered level of consciousness , altered mental status, extremity weakness, burning feet, involuntary movement, seizure and syncope.  Psych: negative for anxiety, depression, insomnia, tearfulness, panic attacks, hallucinations, paranoia, suicidal or homicidal ideation    Past Medical History  Diagnosis Date  . Diabetes mellitus, type II   . Hyperlipidemia     Lipid profile in 02/2012:135, 227, 41, 49  . Arteriosclerotic cardiovascular disease (ASCVD)     CABG x 4 1989, VG->OM1->OM2, VG->RCA, LIMA->LAD; 05/24/10 Cath/PCI:  VG->OM1->OM2 90 in graft (3.0x15 promus) & 90 in distal LCX (2.75x15 Promus);  NSTEMI in 04/2011 - TO distal LCX stent and VG->OM2.; 02/2012 NSTEMI Cath/PCI: 3VD, LIMA->LAD ok, VG->RCA 100, VG->OM1 99p (2.75x81mm Resolute DES), continuation to OM2 occluded.  . Hypertension   . Arteriosclerotic cardiovascular disease (ASCVD)   . Osteoarthritis   . CVA (cerebral infarction)   . Pneumonia   . Cervical vertebral fracture   . Chronic back pain   . Benign prostatic hypertrophy     Required urinary catheter x4 weeks for retention  . Peptic ulcer disease   . Gastroesophageal reflux disease   . Stroke     Past Surgical History  Procedure Date  . Coronary angioplasty with stent placement     2011, 02/2012  . Tonsillectomy   . Coronary artery bypass graft     Medications:  HOME MEDS: Prior to  Admission medications   Medication Sig Start Date End Date Taking? Authorizing Provider  albuterol (PROVENTIL HFA;VENTOLIN HFA) 108 (90 BASE) MCG/ACT inhaler Inhale 2 puffs into the lungs every 6 (six) hours as needed. For  shortness of breath   Yes Historical Provider, MD  aspirin 81 MG chewable tablet Chew 1 tablet (81 mg total) by mouth daily. 06/21/12  Yes Roger A Arguello, PA-C  atorvastatin (LIPITOR) 80 MG tablet Take 1 tablet (80 mg total) by mouth daily at 6 PM. 06/21/12  Yes Roger A Arguello, PA-C  B Complex-Biotin-FA (B-COMPLEX PO) Take 1 tablet by mouth daily.    Yes Historical Provider, MD  calcium carbonate (TUMS - DOSED IN MG ELEMENTAL CALCIUM) 500 MG chewable tablet Chew 1 tablet by mouth 4 (four) times daily - after meals and at bedtime.   Yes Historical Provider, MD  carvedilol (COREG) 6.25 MG tablet Take 6.25 mg by mouth 2 (two) times daily with a meal.   Yes Historical Provider, MD  Docusate Sodium (STOOL SOFTENER) 100 MG capsule Take 100 mg by mouth at bedtime. Constipation   Yes Historical Provider, MD  fish oil-omega-3 fatty acids 1000 MG capsule Take 1 g by mouth daily.   Yes Historical Provider, MD  furosemide (LASIX) 20 MG tablet Take 1 tablet (20 mg total) by mouth daily. 06/21/12  Yes Roger A Arguello, PA-C  metFORMIN (GLUCOPHAGE) 500 MG tablet Take 500 mg by mouth 2 (two) times daily. 03/12/12  Yes Ok Anis, NP  Multiple Vitamin (MULTIVITAMIN WITH MINERALS) TABS Take 1 tablet by mouth daily.   Yes Historical Provider, MD  nitrofurantoin (MACRODANTIN) 100 MG capsule Take 100 mg by mouth at bedtime.   Yes Historical Provider, MD  nitroGLYCERIN (NITROSTAT) 0.4 MG SL tablet Place 1 tablet (0.4 mg total) under the tongue every 5 (five) minutes x 3 doses as needed for chest pain. 03/12/12 03/12/13 Yes Ok Anis, NP  pantoprazole (PROTONIX) 40 MG tablet Take 40 mg by mouth every morning. 12/17/11  Yes Christiane Ha, MD  potassium chloride (K-DUR,KLOR-CON) 10 MEQ tablet Take 1 tablet (10 mEq total) by mouth daily. 06/21/12  Yes Roger A Arguello, PA-C  ramipril (ALTACE) 10 MG capsule Take 1 capsule (10 mg total) by mouth daily. 06/08/12  Yes Kathlen Brunswick, MD  saxagliptin HCl  (ONGLYZA) 5 MG TABS tablet Take 5 mg by mouth daily at 12 noon.    Yes Historical Provider, MD  Tamsulosin HCl (FLOMAX) 0.4 MG CAPS Take 0.4 mg by mouth at bedtime. For urinary health   Yes Historical Provider, MD  Ticagrelor (BRILINTA) 90 MG TABS tablet Take 1 tablet (90 mg total) by mouth 2 (two) times daily. 06/21/12  Yes Roger A Arguello, PA-C     Allergies:  No Known Allergies  Social History:   reports that he quit smoking about 50 years ago. His smoking use included Cigarettes. He has a 20 pack-year smoking history. His smokeless tobacco use includes Chew. He reports that he does not drink alcohol or use illicit drugs.  Family History: Unchanged from previous  visits   Physical Exam: Filed Vitals:   06/23/12 2145 06/24/12 0035 06/24/12 0103 06/24/12 0111  BP:  112/48 125/74   Pulse: 83 86 84   Temp:  98 F (36.7 C) 97.8 F (36.6 C)   TempSrc:  Oral Oral   Resp: 18 18 18    Height:    5\' 10"  (1.778 m)  Weight:    79.652 kg (175 lb 9.6  oz)  SpO2: 97% 99% 98%    Blood pressure 125/74, pulse 84, temperature 97.8 F (36.6 C), temperature source Oral, resp. rate 18, height 5\' 10"  (1.778 m), weight 79.652 kg (175 lb 9.6 oz), SpO2 98.00%.  GEN:  Pleasant comfortable looking Caucasian gentleman lying in the stretcher in no acute distress; cooperative with exam PSYCH:  alert and oriented x; does not appear anxious or depressed; affect is appropriate. HEENT: Mucous membranes pink and anicteric; PERRLA; EOM intact; no cervical lymphadenopathy nor thyromegaly or carotid bruit; no JVD; Breasts:: Not examined CHEST WALL: No tenderness CHEST: Normal respiration, clear to auscultation bilaterally HEART: Regular rate and rhythm; no murmurs rubs or gallops BACK: No kyphosis or scoliosis; no CVA tenderness ABDOMEN: Obese, soft non-tender; no masses, no organomegaly, normal abdominal bowel sounds; no pannus; no intertriginous candida. Rectal Exam: Not done EXTREMITIES:  age-appropriate  arthropathy of the hands and knees; no edema; no ulcerations. Genitalia: not examined PULSES: 2+ and symmetric SKIN: Normal hydration no rash or ulceration CNS: Cranial nerves 2-12 grossly intact no focal lateralizing neurologic deficit   Labs on Admission:  Basic Metabolic Panel:  Lab 06/23/12 2956 06/20/12 0600 06/19/12 0545 06/18/12 1510  NA 136 138 137 136  K 3.9 3.9 3.7 4.0  CL 100 103 101 100  CO2 23 26 24 24   GLUCOSE 212* 211* 191* 209*  BUN 16 13 11 14   CREATININE 1.06 1.03 0.92 1.10  CALCIUM 9.5 9.2 9.1 9.5  MG -- -- -- --  PHOS -- -- -- --   Liver Function Tests:  Lab 06/19/12 0545 06/18/12 1510  AST 35 29  ALT 22 26  ALKPHOS 65 59  BILITOT 0.4 0.2*  PROT 6.4 6.4  ALBUMIN 3.5 3.6   No results found for this basename: LIPASE:5,AMYLASE:5 in the last 168 hours No results found for this basename: AMMONIA:5 in the last 168 hours CBC:  Lab 06/23/12 1945 06/20/12 0600 06/19/12 0545 06/18/12 1510  WBC 7.3 6.5 8.2 8.2  NEUTROABS 4.8 -- -- --  HGB 12.4* 11.4* 11.6* 11.7*  HCT 36.9* 34.7* 35.6* 34.9*  MCV 89.6 89.2 89.2 90.4  PLT 179 151 163 171   Cardiac Enzymes:  Lab 06/23/12 2238 06/23/12 1945 06/19/12 0545 06/18/12 2352 06/18/12 1741  CKTOTAL -- -- -- -- --  CKMB -- -- -- -- --  CKMBINDEX -- -- -- -- --  TROPONINI 0.31* 0.31* 7.38* 5.89* 4.01*   BNP: No components found with this basename: POCBNP:5 D-dimer: No components found with this basename: D-DIMER:5 CBG:  Lab 06/21/12 1132 06/21/12 0726 06/20/12 2047 06/20/12 1611 06/20/12 1107  GLUCAP 210* 201* 273* 283* 194*    Radiological Exams on Admission: Dg Chest Port 1 View  06/23/2012  *RADIOLOGY REPORT*  Clinical Data: Weakness  PORTABLE CHEST - 1 VIEW  Comparison: None.  Findings: Postop CABG.  Negative for heart failure.  Mild left lower lobe airspace disease may represent atelectasis or pneumonia, unchanged.  Right lung is clear.  Negative for pleural effusion.  IMPRESSION: Mild left lower lobe  atelectasis or infiltrate.  This is unchanged.   Original Report Authenticated By: Janeece Riggers, M.D.     EKG: Independently reviewed. Sinus rhythm, progression of inferior lead T-wave inversions compared to her EKG associated with his recent MI   Assessment/Plan Present on Admission:  . Fatigue . Weakness . Hyperglycemia . Hyperlipidemia . Dementia . Chest pain . Diabetes mellitus, type II . Hypertension . Cardiomyopathy, ischemic . Chronic combined systolic and diastolic CHF (congestive  heart failure)   PLAN: As requested by the cardiology service we'll admit this patient overnight continued cycling of his cardiac enzymes, rule out were a recurrence of acute coronary syndrome; will have cardiology evaluation in the morning  Other plans as per orders.  Code Status: FULL CODE   Disposition Plan: Per cardiology    Evelise Reine Nocturnist [facility] Pager [phone]   06/24/2012, 1:41 AM

## 2012-06-25 ENCOUNTER — Encounter: Payer: Medicare Other | Admitting: Adult Health

## 2012-06-25 DIAGNOSIS — E119 Type 2 diabetes mellitus without complications: Secondary | ICD-10-CM

## 2012-06-25 LAB — CBC WITH DIFFERENTIAL/PLATELET
Basophils Absolute: 0 10*3/uL (ref 0.0–0.1)
Basophils Absolute: 0 10*3/uL (ref 0.0–0.1)
Basophils Absolute: 0 10*3/uL (ref 0.0–0.1)
Basophils Relative: 0 % (ref 0–1)
Basophils Relative: 0 % (ref 0–1)
Eosinophils Absolute: 0.2 10*3/uL (ref 0.0–0.7)
Eosinophils Absolute: 0.2 10*3/uL (ref 0.0–0.7)
Eosinophils Absolute: 0.2 10*3/uL (ref 0.0–0.7)
Eosinophils Relative: 2 % (ref 0–5)
Eosinophils Relative: 2 % (ref 0–5)
Eosinophils Relative: 3 % (ref 0–5)
HCT: 34.6 % — ABNORMAL LOW (ref 39.0–52.0)
HCT: 39.4 % (ref 39.0–52.0)
Hemoglobin: 13.1 g/dL (ref 13.0–17.0)
Hemoglobin: 12.4 g/dL — ABNORMAL LOW (ref 13.0–17.0)
Lymphocytes Relative: 20 % (ref 12–46)
Lymphocytes Relative: 24 % (ref 12–46)
Lymphs Abs: 1.5 10*3/uL (ref 0.7–4.0)
Lymphs Abs: 1.4 10*3/uL (ref 0.7–4.0)
Lymphs Abs: 1.4 10*3/uL (ref 0.7–4.0)
MCH: 29.8 pg (ref 26.0–34.0)
MCH: 30.4 pg (ref 26.0–34.0)
MCH: 30.2 pg (ref 26.0–34.0)
MCHC: 33.2 g/dL (ref 30.0–36.0)
MCHC: 34 g/dL (ref 30.0–36.0)
MCHC: 33.5 g/dL (ref 30.0–36.0)
MCV: 89.5 fL (ref 78.0–100.0)
MCV: 89.9 fL (ref 78.0–100.0)
MCV: 90.1 fL (ref 78.0–100.0)
Monocytes Absolute: 0.6 10*3/uL (ref 0.1–1.0)
Monocytes Absolute: 0.9 10*3/uL (ref 0.1–1.0)
Monocytes Relative: 11 % (ref 3–12)
Monocytes Relative: 9 % (ref 3–12)
Neutro Abs: 4.4 10*3/uL (ref 1.7–7.7)
Neutro Abs: 5.2 10*3/uL (ref 1.7–7.7)
Neutrophils Relative %: 67 % (ref 43–77)
Neutrophils Relative %: 65 % (ref 43–77)
Platelets: 160 10*3/uL (ref 150–400)
Platelets: 180 10*3/uL (ref 150–400)
Platelets: 184 10*3/uL (ref 150–400)
RBC: 4.4 MIL/uL (ref 4.22–5.81)
RBC: 4.08 MIL/uL — ABNORMAL LOW (ref 4.22–5.81)
RDW: 13.8 % (ref 11.5–15.5)
RDW: 13.9 % (ref 11.5–15.5)
RDW: 13.9 % (ref 11.5–15.5)
WBC: 6.8 10*3/uL (ref 4.0–10.5)
WBC: 7.8 10*3/uL (ref 4.0–10.5)

## 2012-06-25 LAB — GLUCOSE, CAPILLARY
Glucose-Capillary: 150 mg/dL — ABNORMAL HIGH (ref 70–99)
Glucose-Capillary: 183 mg/dL — ABNORMAL HIGH (ref 70–99)
Glucose-Capillary: 173 mg/dL — ABNORMAL HIGH (ref 70–99)

## 2012-06-25 LAB — TYPE AND SCREEN: Antibody Screen: NEGATIVE

## 2012-06-25 MED ORDER — BISMUTH SUBSALICYLATE 262 MG/15ML PO SUSP
30.0000 mL | ORAL | Status: DC | PRN
  Administered 2012-06-25: 30 mL via ORAL
  Filled 2012-06-25: qty 236

## 2012-06-25 MED ORDER — BISMUTH SUBSALICYLATE 262 MG PO CHEW
524.0000 mg | CHEWABLE_TABLET | ORAL | Status: DC | PRN
  Filled 2012-06-25: qty 2

## 2012-06-25 MED ORDER — BISMUTH SUBSALICYLATE 262 MG/15ML PO SUSP
30.0000 mL | ORAL | Status: DC | PRN
  Filled 2012-06-25: qty 236

## 2012-06-25 MED ORDER — SODIUM CHLORIDE 0.9 % IV SOLN
INTRAVENOUS | Status: DC
  Administered 2012-06-25 – 2012-06-26 (×2): via INTRAVENOUS

## 2012-06-25 MED ORDER — BISMUTH SUBSALICYLATE 262 MG/15ML PO SUSP
ORAL | Status: AC
  Filled 2012-06-25: qty 236

## 2012-06-25 NOTE — Progress Notes (Signed)
UR Chart Review Completed  

## 2012-06-25 NOTE — Progress Notes (Signed)
     Subjective: This man feels extremely weak and lethargic. He is asked he had significant diarrhea after having been given laxatives for constipation. His blood pressure is low. Medications have been adjusted by cardiology but his blood pressure remains low to some degree.           Physical Exam: Blood pressure 93/56, pulse 88, temperature 95.3 F (35.2 C), temperature source Oral, resp. rate 20, height 5\' 10"  (1.778 m), weight 79.652 kg (175 lb 9.6 oz), SpO2 98.00%. He-he looks systemically well. Is not clinically shock despite the soft blood pressure. Heart sounds are present without gallop rhythm. Lung fields are clear. He is alert and orientated.   Investigations:  Recent Results (from the past 240 hour(s))  MRSA PCR SCREENING     Status: Normal   Collection Time   06/18/12  5:23 PM      Component Value Range Status Comment   MRSA by PCR NEGATIVE  NEGATIVE Final      Basic Metabolic Panel:  [facility] 06/24/12 0517 06/23/12 1945  NA 136 136  K 4.1 3.9  CL 100 100  CO2 27 23  GLUCOSE 177* 212*  BUN 15 16  CREATININE 1.06 1.06  CALCIUM 9.5 9.5  MG -- --  PHOS -- --       CBC:  Basename 06/25/12 1045 06/25/12 0701  WBC 8.9 7.8  NEUTROABS 6.5 5.2  HGB 12.4* 13.1  HCT 36.5* 39.4  MCV 89.5 89.5  PLT 177 203    Dg Chest Port 1 View  06/23/2012  *RADIOLOGY REPORT*  Clinical Data: Weakness  PORTABLE CHEST - 1 VIEW  Comparison: None.  Findings: Postop CABG.  Negative for heart failure.  Mild left lower lobe airspace disease may represent atelectasis or pneumonia, unchanged.  Right lung is clear.  Negative for pleural effusion.  IMPRESSION: Mild left lower lobe atelectasis or infiltrate.  This is unchanged.   Original Report Authenticated By: Janeece Riggers, M.D.       Medications: I have reviewed the patient's current medications.  Impression: 1. Hypovolemia, compounded by recent diarrhea. 2. Recent myocardial infarction at [facility]. No  evidence of this on this admission. 3. Ischemic cardiomyopathy. 4. Chronic combined systolic and diastolic congestive heart failure, currently compensated.     Plan: 1. Intravenous fluids. 2. Monitor overnight. 3. Hopefully, discharge home tomorrow morning.     LOS: 2 days   Wilson Singer Pager [phone]  06/25/2012, 11:45 AM

## 2012-06-25 NOTE — Progress Notes (Addendum)
SUBJECTIVE:Complains only of frequent diarrhea after stool softener use.  Feels better than he did this morning.  Has not been out of bed, but has experienced no lightheadedness.  Denies dyspnea or chest discomfort.  LABS: Basic Metabolic Panel:  Basename 06/24/12 0517 06/23/12 1945  NA 136 136  K 4.1 3.9  CL 100 100  CO2 27 23  GLUCOSE 177* 212*  BUN 15 16  CREATININE 1.06 1.06  CALCIUM 9.5 9.5  MG -- --  PHOS -- --   CBC:  Basename 06/25/12 1045 06/25/12 0701  WBC 8.9 7.8  NEUTROABS 6.5 5.2  HGB 12.4* 13.1  HCT 36.5* 39.4  MCV 89.5 89.5  PLT 177 203   Cardiac Enzymes:  Basename 06/24/12 0517 06/23/12 2238 06/23/12 1945  CKTOTAL -- -- --  CKMB -- -- --  CKMBINDEX -- -- --  TROPONINI <0.30 0.31* 0.31*    RADIOLOGY: Dg Chest Port 1 View  06/23/2012  *RADIOLOGY REPORT*  Clinical Data: Weakness  PORTABLE CHEST - 1 VIEW  Comparison: None.  Findings: Postop CABG.  Negative for heart failure.  Mild left lower lobe airspace disease may represent atelectasis or pneumonia, unchanged.  Right lung is clear.  Negative for pleural effusion.  IMPRESSION: Mild left lower lobe atelectasis or infiltrate.  This is unchanged.   Original Report Authenticated By: Janeece Riggers, M.D.    PHYSICAL EXAM BP 93/56  Pulse 88  Temp 95.3 F (35.2 C) (Oral)  Resp 20  Ht 5\' 10"  (1.778 m)  Wt 175 lb 9.6 oz (79.652 kg)  BMI 25.20 kg/m2  SpO2 98% General: Well developed, well nourished, in no acute distress, edentulous Head: Eyes PERRLA, No xanthomas.   Normal cephalic and atramatic  Lungs: Clear bilaterally to auscultation and percussion. Heart: HRRR S1 S2,distant with 1/6 SM  .  Pulses are 2+ & equal. No carotid bruit. No JVD.  No abdominal bruits. No femoral bruits. Abdomen: Bowel sounds are positive, abdomen soft and non-tender without masses or hernia Msk:  Back normal, normal gait. Normal strength and tone for age. Extremities: No clubbing, cyanosis or edema.  DP +1 Neuro: Alert and  oriented X 3. Psych:  Good affect, responds appropriately  TELEMETRY: Reviewed; sinus rhythm without significant arrhythmias  ASSESSMENT AND PLAN:  1. Hypotension: He is significantly orthostatic today with BP drop from 143/81 to 93/56, despite decrease of lisinopril to 2.5 from 10mg . He is having frequent loose stools. IV fluids are being provided to prevent  dehydration.  Will monitor BP response to hydration without change in lisinopril dose, or discontinuing it at this time. He is on tamsulosin 0.4 mg. Can consider reducing the dose should he remain orthostatic.  2. CAD: Recent hospitalization last week with PCI using DES to proxinal SVG-ramus in the setting of STEMI. He has had decrease in EF to 40% from normal EF of 55%. He continues to feel weakness, but no chest discomfort or dyspnea. He should continue ASA, and Brilinta. No evidence of anemia or issues with bleeding at present. Hemoccult stools.   Bettey Mare. Lyman Bishop NP [facility] 06/25/2012, 12:30 PM  Cardiology Attending Patient interviewed and examined. Discussed with Joni Reining, NP.  Above note annotated and modified based upon my findings.  Patient continues to have difficulty with blood pressure, but otherwise appears to be doing well overall. Ramipril, carvedilol and tamsulosin are the drugs potentially adversely affecting BP, but continuation of all of these is desirable for treatment of his other medical problems. If blood  pressure is not adequate tomorrow, they all can be held for the time being. We will arrange followup in the office within the next 2 weeks.  Hawthorn Woods Bing, MD 06/25/2012, 4:50 PM

## 2012-06-26 DIAGNOSIS — N400 Enlarged prostate without lower urinary tract symptoms: Secondary | ICD-10-CM

## 2012-06-26 LAB — CBC WITH DIFFERENTIAL/PLATELET
Basophils Relative: 1 % (ref 0–1)
Hemoglobin: 11.9 g/dL — ABNORMAL LOW (ref 13.0–17.0)
MCHC: 33.3 g/dL (ref 30.0–36.0)
Monocytes Relative: 8 % (ref 3–12)
Neutro Abs: 4.1 10*3/uL (ref 1.7–7.7)
Neutrophils Relative %: 72 % (ref 43–77)
RBC: 3.96 MIL/uL — ABNORMAL LOW (ref 4.22–5.81)

## 2012-06-26 LAB — COMPREHENSIVE METABOLIC PANEL
AST: 18 U/L (ref 0–37)
Albumin: 3.4 g/dL — ABNORMAL LOW (ref 3.5–5.2)
Calcium: 8.9 mg/dL (ref 8.4–10.5)
Creatinine, Ser: 0.89 mg/dL (ref 0.50–1.35)

## 2012-06-26 LAB — CBC
MCH: 30.1 pg (ref 26.0–34.0)
MCV: 89.6 fL (ref 78.0–100.0)
Platelets: 159 10*3/uL (ref 150–400)
RDW: 13.7 % (ref 11.5–15.5)

## 2012-06-26 MED ORDER — CARVEDILOL 3.125 MG PO TABS: 3.1250 mg | ORAL_TABLET | Freq: Two times a day (BID) | ORAL | Status: DC

## 2012-06-26 NOTE — Discharge Summary (Signed)
Physician Discharge Summary  Jeffrey [name]:[id number] DOB: 16-Oct-1928 DOA: 06/23/2012  PCP: [name], MD  Admit date: 06/23/2012 Discharge date: 06/26/2012  Time spent: Greater than 30 minutes  Recommendations for Outpatient Follow-up:  1. Followup with cardiology, Dr. Dietrich Pates in the next 1-2 weeks.   Discharge Diagnoses:  1. Orthostatic hypotension secondary to hypovolemia, compounded by recent diarrhea, improving. 2. Recent myocardial infarction, none on this admission. 3. Ischemic cardiac myopathy. 4. Chronic combined systolic and diastolic congestive heart failure, currently compensated. 5. Type 2 diabetes mellitus.   Discharge Condition: Stable.  Diet recommendation: Carbohydrate modified diet.  Filed Weights   06/24/12 0111 06/26/12 0702  Weight: 79.652 kg (175 lb 9.6 oz) 79.1 kg (174 lb 6.1 oz)    History of present illness:  This very pleasant 78 year old man presents to the hospital with symptoms of chest pain and weakness for past 2 days. Please see initial history as outlined below: Jeffrey [name] is an 78 y.o. male. Caucasian gentleman lives alone discharged from [facility] 2 days ago after the management of ST elevation MI with drug-eluting stents. Presents to our emergency room complaining of progressive weakness and not feeling well since discharge. Also reports an episode of chest pain which is now resolved after nitroglycerin at home.  In the emergency room his troponins were noted to be 0.31, a cardiologist at [facility] were contacted who recommended recommended a repeat troponins after a few hours, and when this troponins returned unchanged, they felt the patient was not having acute myocardial event and could be observed overnight to [facility] to be seen by cardiologist this morning.  Other than generalized weakness patient has no other acute.  This patient is well-known to the hospitalist service, and is currently looking  very much at his baseline, and notably although mildly obese was able to pull himself up into an upright position for examination without any assistance, and without showing signs of distress.  Hospital Course:  The patient was admitted to telemetry floor and serial cardiac enzymes were done. These were negative. He was seen by cardiology, Dr. Dietrich Pates, who made adjustments to his medication in view of his continued low blood pressure. Unfortunately, he had diarrhea which made things worse and he became somewhat dehydrated with this. He was given intravenous fluids and he is made an improvement with this. His medications have been adjusted to minimize any medication that would reduce his blood pressure at the same time trying to maintain cardioprotective effects. Further medication adjustments may have to be made as an outpatient. He is now stable to be discharged home.  Procedures:  None.   Consultations:  Cardiology, Dr. Dietrich Pates.  Discharge Exam: Filed Vitals:   06/26/12 0703 06/26/12 0704 06/26/12 0822 06/26/12 1118  BP: 93/61 90/57 105/67 99/61  Pulse: 73 73 86 78  Temp:      TempSrc:      Resp:   20 20  Height:      Weight:      SpO2:        General: He looks systemically well. Cardiovascular: Heart sounds are present without murmurs or gallop rhythm. Respiratory: Lung fields are clear. He is alert and orientated.  Discharge Instructions  Discharge Orders    Future Appointments: Provider: Department: Dept Phone: Center:   08/18/2012 1:00 PM Kathlen Brunswick, MD [facility] [place] [phone] [email or web address]     Future Orders Please Complete By Expires   Diet - low sodium heart healthy  Increase activity slowly          Medication List     As of 06/26/2012 11:37 AM    STOP taking these medications         furosemide 20 MG tablet   Commonly known as: LASIX      ramipril 10 MG capsule   Commonly known as: ALTACE      TAKE these medications          albuterol 108 (90 BASE) MCG/ACT inhaler   Commonly known as: PROVENTIL HFA;VENTOLIN HFA   Inhale 2 puffs into the lungs every 6 (six) hours as needed. For shortness of breath      aspirin 81 MG chewable tablet   Chew 1 tablet (81 mg total) by mouth daily.      atorvastatin 80 MG tablet   Commonly known as: LIPITOR   Take 1 tablet (80 mg total) by mouth daily at 6 PM.      B-COMPLEX PO   Take 1 tablet by mouth daily.      calcium carbonate 500 MG chewable tablet   Commonly known as: TUMS - dosed in mg elemental calcium   Chew 1 tablet by mouth 4 (four) times daily - after meals and at bedtime.      carvedilol 3.125 MG tablet   Commonly known as: COREG   Take 1 tablet (3.125 mg total) by mouth 2 (two) times daily with a meal.      fish oil-omega-3 fatty acids 1000 MG capsule   Take 1 g by mouth daily.      metFORMIN 500 MG tablet   Commonly known as: GLUCOPHAGE   Take 500 mg by mouth 2 (two) times daily.      multivitamin with minerals Tabs   Take 1 tablet by mouth daily.      nitrofurantoin 100 MG capsule   Commonly known as: MACRODANTIN   Take 100 mg by mouth at bedtime.      nitroGLYCERIN 0.4 MG SL tablet   Commonly known as: NITROSTAT   Place 1 tablet (0.4 mg total) under the tongue every 5 (five) minutes x 3 doses as needed for chest pain.      ONGLYZA 5 MG Tabs tablet   Generic drug: saxagliptin HCl   Take 5 mg by mouth daily at 12 noon.      pantoprazole 40 MG tablet   Commonly known as: PROTONIX   Take 40 mg by mouth every morning.      potassium chloride 10 MEQ tablet   Commonly known as: K-DUR,KLOR-CON   Take 1 tablet (10 mEq total) by mouth daily.      STOOL SOFTENER 100 MG capsule   Generic drug: Docusate Sodium   Take 100 mg by mouth at bedtime. Constipation      Tamsulosin HCl 0.4 MG Caps   Commonly known as: FLOMAX   Take 0.4 mg by mouth at bedtime. For urinary health      Ticagrelor 90 MG Tabs tablet   Commonly known as: BRILINTA   Take  1 tablet (90 mg total) by mouth 2 (two) times daily.           Follow-up Information    Follow up with Laddonia Bing, MD. Schedule an appointment as soon as possible for a visit in 1 week.   Contact information:   [address] [place] [address] [phone]           The results of significant  diagnostics from this hospitalization (including imaging, microbiology, ancillary and laboratory) are listed below for reference.    Significant Diagnostic Studies: US Carotid Duplex Bilateral  06/14/2012  *RADIOLOGY REPORT*  Clinical Data: Bruit  BILATERAL CAROTID DUPLEX ULTRASOUND  Technique: Wallace Cullens scale imaging, color Doppler and duplex ultrasound was performed of bilateral carotid and vertebral arteries in the neck.  Comparison:  Carotid duplex ultrasound 04/08/2010  Criteria:  Quantification of carotid stenosis is based on velocity parameters that correlate the residual internal carotid diameter with NASCET-based stenosis levels, using the diameter of the distal internal carotid lumen as the denominator for stenosis measurement.  The following velocity measurements were obtained:                   PEAK SYSTOLIC/END DIASTOLIC RIGHT ICA:                        93/15cm/sec CCA:                        90/9cm/sec SYSTOLIC ICA/CCA RATIO:     1.0 DIASTOLIC ICA/CCA RATIO:    1.7 ECA:                        161cm/sec  LEFT ICA:                        67/18cm/sec CCA:                        111/10cm/sec SYSTOLIC ICA/CCA RATIO:     0.6 DIASTOLIC ICA/CCA RATIO:    1.8 ECA:                        122cm/sec  Findings:  RIGHT CAROTID ARTERY: Scattered areas of atheromatous plaque without significant stenosis by gray scale, color Doppler or spectral Doppler evaluation. Pulsus bisferiens waveform noted in the common and internal carotid arteries.  RIGHT VERTEBRAL ARTERY:  Patent with normal antegrade flow  LEFT CAROTID ARTERY: Scattered areas of atherosclerotic plaque in the left carotid artery, particularly  along the back wall without evidence of hemodynamically significant stenosis by gray scale, color Doppler or spectral Doppler imaging. Pulsus bisferiens waveform noted in the common and internal carotid arteries.  LEFT VERTEBRAL ARTERY:  Patent with normal antegrade flow  IMPRESSION:  1. Pulsus bisferiens waveform noted in the bilateral common and internal carotid arteries.  This wave form can be seen in patients with underlying aortic valvular disease, specifically with aortic regurgitation.  If clinically warranted further evaluation with echocardiography could confirm.  2.  Scattered atherosclerotic plaque in the bilateral carotid arteries without significant stenosis or interval progression compared to 04/08/2010.  3.  Patent bilateral vertebral arteries with normal antegrade flow.  Signed,  Sterling Big, MD Vascular & Interventional Radiologist [facility]   Original Report Authenticated By: Malachy Moan, M.D.    Dg Chest Port 1 View  06/23/2012  *RADIOLOGY REPORT*  Clinical Data: Weakness  PORTABLE CHEST - 1 VIEW  Comparison: None.  Findings: Postop CABG.  Negative for heart failure.  Mild left lower lobe airspace disease may represent atelectasis or pneumonia, unchanged.  Right lung is clear.  Negative for pleural effusion.  IMPRESSION: Mild left lower lobe atelectasis or infiltrate.  This is unchanged.   Original Report Authenticated By: Janeece Riggers, M.D.    Portable Chest X-ray 1  View  06/18/2012  *RADIOLOGY REPORT*  Clinical Data: Myocardial infarction.  PORTABLE CHEST - 1 VIEW  Comparison: 05/17/2012.  Findings: The cardiac silhouette remains borderline enlarged. Stable post CABG changes. Small amount of linear density at the left lung base.  Otherwise, clear lungs with normal vascularity. Thoracic spine degenerative changes.  IMPRESSION: Minimal left basilar atelectasis.   Original Report Authenticated By: Beckie Salts, M.D.     Microbiology: Recent Results (from the past  240 hour(s))  MRSA PCR SCREENING     Status: Normal   Collection Time   06/18/12  5:23 PM      Component Value Range Status Comment   MRSA by PCR NEGATIVE  NEGATIVE Final      Labs: Basic Metabolic Panel:  Lab 06/26/12 1610 06/24/12 0517 06/23/12 1945 06/20/12 0600  NA 135 136 136 138  K 3.9 4.1 3.9 3.9  CL 104 100 100 103  CO2 22 27 23 26   GLUCOSE 171* 177* 212* 211*  BUN 16 15 16 13   CREATININE 0.89 1.06 1.06 1.03  CALCIUM 8.9 9.5 9.5 9.2  MG -- -- -- --  PHOS -- -- -- --   Liver Function Tests:  Lab 06/26/12 0325  AST 18  ALT 21  ALKPHOS 70  BILITOT 0.2*  PROT 6.4  ALBUMIN 3.4*     CBC:  Lab 06/26/12 0852 06/26/12 0325 06/25/12 2334 06/25/12 1854 06/25/12 1459 06/25/12 1045  WBC 5.7 5.9 6.8 7.8 7.2 --  NEUTROABS 4.1 -- 4.4 5.0 5.0 6.5  HGB 11.9* 12.1* 11.6* 11.8* 12.0* --  HCT 35.7* 36.0* 34.6* 35.5* 35.8* --  MCV 90.2 89.6 89.6 90.1 89.9 --  PLT 162 159 160 180 184 --   Cardiac Enzymes:  Lab 06/24/12 0517 06/23/12 2238 06/23/12 1945  CKTOTAL -- -- --  CKMB -- -- --  CKMBINDEX -- -- --  TROPONINI <0.30 0.31* 0.31*   BNP: BNP (last 3 results)  Basename 04/17/12 1521 03/22/12 0445 03/21/12 1821  PROBNP 438.7 1257.0* 1387.0*   CBG:  Lab 06/26/12 0739 06/25/12 2123 06/25/12 1711 06/25/12 1210 06/25/12 0720  GLUCAP 164* 173* 182* 183* 150*       Signed:  Taraya Steward C  [facility] 06/26/2012, 11:37 AM

## 2012-06-26 NOTE — Progress Notes (Signed)
Discharge instructions given to pt. With teach back given to RN. Pt. Taken to car via W/C. 

## 2012-06-28 ENCOUNTER — Ambulatory Visit: Payer: Medicare Other | Admitting: *Deleted

## 2012-06-28 VITALS — BP 128/74 | HR 83 | Ht 70.0 in | Wt 181.0 lb

## 2012-06-28 DIAGNOSIS — I10 Essential (primary) hypertension: Secondary | ICD-10-CM

## 2012-06-29 ENCOUNTER — Other Ambulatory Visit: Payer: Self-pay

## 2012-06-29 ENCOUNTER — Emergency Department (HOSPITAL_COMMUNITY)
Admission: EM | Admit: 2012-06-29 | Discharge: 2012-06-29 | Disposition: A | Discharge status: Home / Self Care | Payer: Medicare Other | Attending: Emergency Medicine | Admitting: Emergency Medicine

## 2012-06-29 ENCOUNTER — Encounter (HOSPITAL_COMMUNITY): Payer: Self-pay | Admitting: Emergency Medicine

## 2012-06-29 DIAGNOSIS — Z7982 Long term (current) use of aspirin: Secondary | ICD-10-CM | POA: Insufficient documentation

## 2012-06-29 DIAGNOSIS — E785 Hyperlipidemia, unspecified: Secondary | ICD-10-CM | POA: Insufficient documentation

## 2012-06-29 DIAGNOSIS — R079 Chest pain, unspecified: Secondary | ICD-10-CM

## 2012-06-29 DIAGNOSIS — I2510 Atherosclerotic heart disease of native coronary artery without angina pectoris: Secondary | ICD-10-CM | POA: Insufficient documentation

## 2012-06-29 DIAGNOSIS — Z8781 Personal history of (healed) traumatic fracture: Secondary | ICD-10-CM | POA: Insufficient documentation

## 2012-06-29 DIAGNOSIS — Z79899 Other long term (current) drug therapy: Secondary | ICD-10-CM | POA: Insufficient documentation

## 2012-06-29 DIAGNOSIS — E119 Type 2 diabetes mellitus without complications: Secondary | ICD-10-CM | POA: Insufficient documentation

## 2012-06-29 DIAGNOSIS — R0789 Other chest pain: Secondary | ICD-10-CM | POA: Insufficient documentation

## 2012-06-29 DIAGNOSIS — M1990 Unspecified osteoarthritis, unspecified site: Secondary | ICD-10-CM | POA: Insufficient documentation

## 2012-06-29 DIAGNOSIS — Z8673 Personal history of transient ischemic attack (TIA), and cerebral infarction without residual deficits: Secondary | ICD-10-CM | POA: Insufficient documentation

## 2012-06-29 DIAGNOSIS — N400 Enlarged prostate without lower urinary tract symptoms: Secondary | ICD-10-CM | POA: Insufficient documentation

## 2012-06-29 DIAGNOSIS — Z8701 Personal history of pneumonia (recurrent): Secondary | ICD-10-CM | POA: Insufficient documentation

## 2012-06-29 DIAGNOSIS — I10 Essential (primary) hypertension: Secondary | ICD-10-CM | POA: Insufficient documentation

## 2012-06-29 DIAGNOSIS — Z8711 Personal history of peptic ulcer disease: Secondary | ICD-10-CM | POA: Insufficient documentation

## 2012-06-29 DIAGNOSIS — Z87891 Personal history of nicotine dependence: Secondary | ICD-10-CM | POA: Insufficient documentation

## 2012-06-29 DIAGNOSIS — Z8679 Personal history of other diseases of the circulatory system: Secondary | ICD-10-CM | POA: Insufficient documentation

## 2012-06-29 DIAGNOSIS — Z8719 Personal history of other diseases of the digestive system: Secondary | ICD-10-CM | POA: Insufficient documentation

## 2012-06-29 NOTE — ED Provider Notes (Signed)
History   This chart was scribed for [name], MD by Sofie Rower, ED Scribe. The patient was seen in room [facility] and the patient's care was started at 11:28AM.     CSN: [id number]  Arrival date & time 06/29/12  1104   First MD Initiated Contact with Patient 06/29/12 1128      Chief Complaint  Patient presents with  . Chest Pain    (Consider location/radiation/quality/duration/timing/severity/associated sxs/prior treatment) The history is provided by the patient. No language interpreter was used.    Jeffrey [name] is a 78 y.o. male , with a hx of hypertension and diabetes, who presents to the Emergency Department complaining of sudden, progressively improving, chest pain, located at the cental chest, onset today (06/29/12, AM). The pt characterizes the chest pain he experienced this morning as a sensation of tightness. The pt informs he has taken nitroglycerin in the past to successfully relieve his chest pain, however, he did not take any applications prior to arrival at [facility] today.  The pt does not smoke or drink alcohol, however, he does chew tobacco.    PCP is Dr. Sherwood Gambler.    Past Medical History  Diagnosis Date  . Diabetes mellitus, type II   . Hyperlipidemia     Lipid profile in 02/2012:135, 227, 41, 49  . Arteriosclerotic cardiovascular disease (ASCVD)     a. CABG x 4 in 1989 (VG->OM1->OM2, VG->RCA, LIMA->LAD), b. 05/2010: DES to VG-OM1/OM2, DES to distal LCx. c. NSTEMI in 04/2011 - TO distal LCX stent and VG->OM2. d. 02/2012 NSTEMI DES to VG-OM1/continuation to OM2 occluded. e. inferior STEMI s/p DES to [facility] 06/2012.  Marland Kitchen Hypertension   . Osteoarthritis   . CVA (cerebral infarction)     details unclear. pt reports light stroke last year involving L leg. No imaging to suggest hemorrhagic etiology  . Pneumonia   . Cervical vertebral fracture   . Chronic back pain   . Benign prostatic hypertrophy     Required urinary catheter x4 weeks for retention  . Peptic ulcer  disease   . Gastroesophageal reflux disease   . Ischemic cardiomyopathy     EF 40% 06/2012 (previously 50-55% 03/2012)  . DM (diabetes mellitus)     Past Surgical History  Procedure Date  . Coronary angioplasty with stent placement     2011, 02/2012  . Tonsillectomy   . Coronary artery bypass graft     Family History  Problem Relation Age of Onset  . Early death      Parents died young, unclear reasons    History  Substance Use Topics  . Smoking status: Former Smoker -- 2.0 packs/day for 10 years    Types: Cigarettes    Quit date: 07/14/1961  . Smokeless tobacco: Current User    Types: Chew  . Alcohol Use: No      Review of Systems  Cardiovascular: Positive for chest pain.  All other systems reviewed and are negative.    Allergies  Review of patient's allergies indicates no known allergies.  Home Medications   Current Outpatient Rx  Name  Route  Sig  Dispense  Refill  . ALBUTEROL SULFATE HFA 108 (90 BASE) MCG/ACT IN AERS   Inhalation   Inhale 2 puffs into the lungs every 6 (six) hours as needed. For shortness of breath         . ASPIRIN 81 MG PO CHEW   Oral   Chew 1 tablet (81 mg total) by mouth daily.         Marland Kitchen  ATORVASTATIN CALCIUM 80 MG PO TABS   Oral   Take 1 tablet (80 mg total) by mouth daily at 6 PM.   30 tablet   3   . B-COMPLEX PO   Oral   Take 1 tablet by mouth daily.          Marland Kitchen CALCIUM CARBONATE ANTACID 500 MG PO CHEW   Oral   Chew 1 tablet by mouth 4 (four) times daily - after meals and at bedtime.         Marland Kitchen CARVEDILOL 3.125 MG PO TABS   Oral   Take 1 tablet (3.125 mg total) by mouth 2 (two) times daily with a meal.   60 tablet   0   . DOCUSATE SODIUM 100 MG PO TABS   Oral   Take 100 mg by mouth at bedtime. Constipation         . OMEGA-3 FATTY ACIDS 1000 MG PO CAPS   Oral   Take 1 g by mouth daily.         Marland Kitchen METFORMIN HCL 500 MG PO TABS   Oral   Take 500 mg by mouth 2 (two) times daily.         . ADULT  MULTIVITAMIN W/MINERALS CH   Oral   Take 1 tablet by mouth daily.         Marland Kitchen NITROFURANTOIN MACROCRYSTAL 100 MG PO CAPS   Oral   Take 100 mg by mouth at bedtime.         Marland Kitchen NITROGLYCERIN 0.4 MG SL SUBL   Sublingual   Place 1 tablet (0.4 mg total) under the tongue every 5 (five) minutes x 3 doses as needed for chest pain.   25 tablet   3   . PANTOPRAZOLE SODIUM 40 MG PO TBEC   Oral   Take 40 mg by mouth every morning.         Marland Kitchen POTASSIUM CHLORIDE CRYS ER 10 MEQ PO TBCR   Oral   Take 1 tablet (10 mEq total) by mouth daily.   30 tablet   3   . SAXAGLIPTIN HCL 5 MG PO TABS   Oral   Take 5 mg by mouth daily at 12 noon.          Marland Kitchen TAMSULOSIN HCL 0.4 MG PO CAPS   Oral   Take 0.4 mg by mouth at bedtime. For urinary health         . TICAGRELOR 90 MG PO TABS   Oral   Take 1 tablet (90 mg total) by mouth 2 (two) times daily.   60 tablet   6     BP 140/65  Pulse 74  Temp 98.2 F (36.8 C)  Resp 20  Wt 182 lb (82.555 kg)  SpO2 93%  Physical Exam  Nursing note and vitals reviewed. Constitutional: He is oriented to person, place, and time. He appears well-developed and well-nourished.  HENT:  Head: Normocephalic and atraumatic.  Eyes: Conjunctivae normal and EOM are normal. Pupils are equal, round, and reactive to light.  Neck: Normal range of motion. Neck supple.  Cardiovascular: Normal rate, regular rhythm and normal heart sounds.   Pulmonary/Chest: Effort normal and breath sounds normal.  Abdominal: Soft. Bowel sounds are normal.  Musculoskeletal: Normal range of motion.  Neurological: He is alert and oriented to person, place, and time.  Skin: Skin is warm and dry.  Psychiatric: He has a normal mood and affect.    ED Course  Procedures (  including critical care time)  DIAGNOSTIC STUDIES: Oxygen Saturation is 93% on room air, adequate by my interpretation.    COORDINATION OF CARE:   12:06 PM- Treatment plan discussed with patient. Pt agrees with  treatment.      Labs Reviewed  TROPONIN I   No results found.   No diagnosis found.   Date: 06/29/2012  Rate: 74  Rhythm: normal sinus rhythm  QRS Axis: normal  Intervals: normal  ST/T Wave abnormalities: inverted T waves inferiorly, old  Conduction Disutrbances:none  Narrative Interpretation:   Old EKG Reviewed: changes noted   MDM  Patient is now back to baseline. Troponin is negative. EKG normal.  We'll follow up with primary care Dr. PRN.    take nitroglycerin as necessary      I personally performed the services described in this documentation, which was scribed in my presence. The recorded information has been reviewed and is accurate.    [name], MD 06/29/12 [phone]

## 2012-06-29 NOTE — ED Notes (Signed)
Pt now states he is having pressure in chest like elephant is seating on chest.

## 2012-06-29 NOTE — ED Notes (Signed)
Pt called ems with hypertension concerns. Pt reports systolic pressure to be 170s. Pt denies other symptoms.

## 2012-07-15 ENCOUNTER — Emergency Department (HOSPITAL_COMMUNITY)
Admission: EM | Admit: 2012-07-15 | Discharge: 2012-07-15 | Disposition: A | Discharge status: Home / Self Care | Payer: Medicare Other | Attending: Emergency Medicine | Admitting: Emergency Medicine

## 2012-07-15 ENCOUNTER — Encounter (HOSPITAL_COMMUNITY): Payer: Self-pay | Admitting: *Deleted

## 2012-07-15 ENCOUNTER — Emergency Department (HOSPITAL_COMMUNITY): Payer: Medicare Other

## 2012-07-15 DIAGNOSIS — Z8673 Personal history of transient ischemic attack (TIA), and cerebral infarction without residual deficits: Secondary | ICD-10-CM | POA: Insufficient documentation

## 2012-07-15 DIAGNOSIS — Z8781 Personal history of (healed) traumatic fracture: Secondary | ICD-10-CM | POA: Insufficient documentation

## 2012-07-15 DIAGNOSIS — M549 Dorsalgia, unspecified: Secondary | ICD-10-CM | POA: Insufficient documentation

## 2012-07-15 DIAGNOSIS — Z7982 Long term (current) use of aspirin: Secondary | ICD-10-CM | POA: Insufficient documentation

## 2012-07-15 DIAGNOSIS — E785 Hyperlipidemia, unspecified: Secondary | ICD-10-CM | POA: Insufficient documentation

## 2012-07-15 DIAGNOSIS — G8929 Other chronic pain: Secondary | ICD-10-CM | POA: Insufficient documentation

## 2012-07-15 DIAGNOSIS — J40 Bronchitis, not specified as acute or chronic: Secondary | ICD-10-CM | POA: Insufficient documentation

## 2012-07-15 DIAGNOSIS — M1990 Unspecified osteoarthritis, unspecified site: Secondary | ICD-10-CM | POA: Insufficient documentation

## 2012-07-15 DIAGNOSIS — Z87448 Personal history of other diseases of urinary system: Secondary | ICD-10-CM | POA: Insufficient documentation

## 2012-07-15 DIAGNOSIS — E119 Type 2 diabetes mellitus without complications: Secondary | ICD-10-CM | POA: Insufficient documentation

## 2012-07-15 DIAGNOSIS — I10 Essential (primary) hypertension: Secondary | ICD-10-CM | POA: Insufficient documentation

## 2012-07-15 DIAGNOSIS — Z79899 Other long term (current) drug therapy: Secondary | ICD-10-CM | POA: Insufficient documentation

## 2012-07-15 DIAGNOSIS — Z8679 Personal history of other diseases of the circulatory system: Secondary | ICD-10-CM | POA: Insufficient documentation

## 2012-07-15 DIAGNOSIS — Z8701 Personal history of pneumonia (recurrent): Secondary | ICD-10-CM | POA: Insufficient documentation

## 2012-07-15 DIAGNOSIS — Z87891 Personal history of nicotine dependence: Secondary | ICD-10-CM | POA: Insufficient documentation

## 2012-07-15 DIAGNOSIS — Z8711 Personal history of peptic ulcer disease: Secondary | ICD-10-CM | POA: Insufficient documentation

## 2012-07-15 DIAGNOSIS — K219 Gastro-esophageal reflux disease without esophagitis: Secondary | ICD-10-CM | POA: Insufficient documentation

## 2012-07-15 NOTE — ED Provider Notes (Signed)
History     CSN: [id number]  Arrival date & time 07/15/12  1707   First MD Initiated Contact with Patient 07/15/12 1747      Chief Complaint  Patient presents with  . Cough    (Consider location/radiation/quality/duration/timing/severity/associated sxs/prior treatment) Patient is a 79 y.o. male presenting with cough. The history is provided by the patient (the pt complains of a cough with white sputum production). No language interpreter was used.  Cough This is a new problem. The current episode started 1 to 2 hours ago. The problem occurs every few minutes. The problem has been resolved. The cough is productive of sputum. There has been no fever. Pertinent negatives include no chest pain and no headaches.    Past Medical History  Diagnosis Date  . Diabetes mellitus, type II   . Hyperlipidemia     Lipid profile in 02/2012:135, 227, 41, 49  . Arteriosclerotic cardiovascular disease (ASCVD)     a. CABG x 4 in 1989 (VG->OM1->OM2, VG->RCA, LIMA->LAD), b. 05/2010: DES to VG-OM1/OM2, DES to distal LCx. c. NSTEMI in 04/2011 - TO distal LCX stent and VG->OM2. d. 02/2012 NSTEMI DES to VG-OM1/continuation to OM2 occluded. e. inferior STEMI s/p DES to [facility] 06/2012.  Marland Kitchen Hypertension   . Osteoarthritis   . CVA (cerebral infarction)     details unclear. pt reports light stroke last year involving L leg. No imaging to suggest hemorrhagic etiology  . Pneumonia   . Cervical vertebral fracture   . Chronic back pain   . Benign prostatic hypertrophy     Required urinary catheter x4 weeks for retention  . Peptic ulcer disease   . Gastroesophageal reflux disease   . Ischemic cardiomyopathy     EF 40% 06/2012 (previously 50-55% 03/2012)  . DM (diabetes mellitus)     Past Surgical History  Procedure Date  . Coronary angioplasty with stent placement     2011, 02/2012  . Tonsillectomy   . Coronary artery bypass graft     Family History  Problem Relation Age of Onset  . Early death     Parents died young, unclear reasons    History  Substance Use Topics  . Smoking status: Former Smoker -- 2.0 packs/day for 10 years    Types: Cigarettes    Quit date: 07/14/1961  . Smokeless tobacco: Current User    Types: Chew  . Alcohol Use: No      Review of Systems  Constitutional: Negative for fatigue.  HENT: Negative for congestion, sinus pressure and ear discharge.   Eyes: Negative for discharge.  Respiratory: Positive for cough.   Cardiovascular: Negative for chest pain.  Gastrointestinal: Negative for abdominal pain and diarrhea.  Genitourinary: Negative for frequency and hematuria.  Musculoskeletal: Negative for back pain.  Skin: Negative for rash.  Neurological: Negative for seizures and headaches.  Hematological: Negative.   Psychiatric/Behavioral: Negative for hallucinations.    Allergies  Review of patient's allergies indicates no known allergies.  Home Medications   Current Outpatient Rx  Name  Route  Sig  Dispense  Refill  . ALBUTEROL SULFATE HFA 108 (90 BASE) MCG/ACT IN AERS   Inhalation   Inhale 2 puffs into the lungs every 6 (six) hours as needed. For shortness of breath         . ASPIRIN 81 MG PO CHEW   Oral   Chew 1 tablet (81 mg total) by mouth daily.         . ATORVASTATIN CALCIUM 80 MG PO  TABS   Oral   Take 1 tablet (80 mg total) by mouth daily at 6 PM.   30 tablet   3   . B-COMPLEX PO   Oral   Take 1 tablet by mouth daily.          Marland Kitchen CALCIUM CARBONATE ANTACID 500 MG PO CHEW   Oral   Chew 1 tablet by mouth 4 (four) times daily - after meals and at bedtime.         Marland Kitchen CARVEDILOL 3.125 MG PO TABS   Oral   Take 1 tablet (3.125 mg total) by mouth 2 (two) times daily with a meal.   60 tablet   0   . DOCUSATE SODIUM 100 MG PO TABS   Oral   Take 100 mg by mouth at bedtime. Constipation         . OMEGA-3 FATTY ACIDS 1000 MG PO CAPS   Oral   Take 1 g by mouth daily.         Marland Kitchen METFORMIN HCL 500 MG PO TABS   Oral    Take 500 mg by mouth 2 (two) times daily.         . ADULT MULTIVITAMIN W/MINERALS CH   Oral   Take 1 tablet by mouth daily.         Marland Kitchen NITROFURANTOIN MACROCRYSTAL 100 MG PO CAPS   Oral   Take 100 mg by mouth at bedtime.         Marland Kitchen NITROGLYCERIN 0.4 MG SL SUBL   Sublingual   Place 1 tablet (0.4 mg total) under the tongue every 5 (five) minutes x 3 doses as needed for chest pain.   25 tablet   3   . PANTOPRAZOLE SODIUM 40 MG PO TBEC   Oral   Take 40 mg by mouth every morning.         Marland Kitchen POTASSIUM CHLORIDE CRYS ER 10 MEQ PO TBCR   Oral   Take 1 tablet (10 mEq total) by mouth daily.   30 tablet   3   . SAXAGLIPTIN HCL 5 MG PO TABS   Oral   Take 5 mg by mouth daily at 12 noon.          Marland Kitchen TAMSULOSIN HCL 0.4 MG PO CAPS   Oral   Take 0.4 mg by mouth at bedtime. For urinary health         . TICAGRELOR 90 MG PO TABS   Oral   Take 1 tablet (90 mg total) by mouth 2 (two) times daily.   60 tablet   6     BP 138/79  Pulse 94  Temp 97.5 F (36.4 C) (Oral)  Resp 18  Ht 5\' 10"  (1.778 m)  Wt 180 lb (81.647 kg)  BMI 25.83 kg/m2  SpO2 98%  Physical Exam  Constitutional: He is oriented to person, place, and time. He appears well-developed.  HENT:  Head: Normocephalic and atraumatic.  Eyes: Conjunctivae normal and EOM are normal. No scleral icterus.  Neck: Neck supple. No thyromegaly present.  Cardiovascular: Normal rate and regular rhythm.  Exam reveals no gallop and no friction rub.   No murmur heard. Pulmonary/Chest: No stridor. He has no wheezes. He has no rales. He exhibits no tenderness.  Abdominal: He exhibits no distension. There is no tenderness. There is no rebound.  Musculoskeletal: Normal range of motion. He exhibits no edema.  Lymphadenopathy:    He has no cervical adenopathy.  Neurological: He is  oriented to person, place, and time. Coordination normal.  Skin: No rash noted. No erythema.  Psychiatric: He has a normal mood and affect. His behavior is  normal.    ED Course  Procedures (including critical care time)  Labs Reviewed - No data to display Dg Chest 2 View  07/15/2012  *RADIOLOGY REPORT*  Clinical Data: Cough and right chest pain.  History of congestive heart failure.  CHEST - 2 VIEW  Comparison: 06/23/2012 and 06/18/2012 radiographs.  Findings: The heart size and mediastinal contours are stable status post CABG.  The lungs are now clear.  There is no edema, confluent airspace opacity or pleural effusion.  Osseous structures appear unchanged.  IMPRESSION: Resolved left basilar air space disease.  No acute cardiopulmonary process.   Original Report Authenticated By: Carey Bullocks, M.D.      1. Bronchitis       MDM          [name], MD 07/15/12 2042

## 2012-07-15 NOTE — ED Notes (Signed)
Began coughing , caused pain lt lower ant chest and RUQ.  Alert,

## 2012-07-15 NOTE — ED Notes (Signed)
Patient states that if he is ok he would like to go home.

## 2012-07-23 ENCOUNTER — Telehealth: Payer: Self-pay | Admitting: Cardiology

## 2012-07-23 NOTE — Telephone Encounter (Signed)
PT FEELS THAT RAMIPRIL RX IS TO WEEK, IT WAS CHANGED IN PAST BECAUSE IT WAS TO STRONG, HE WANTS TO KNOW IF HE CAN GET SOMETHING IN THE MIDDLE

## 2012-07-26 MED ORDER — CARVEDILOL 3.125 MG PO TABS: 3.1250 mg | ORAL_TABLET | Freq: Two times a day (BID) | ORAL | Status: DC

## 2012-07-26 NOTE — Telephone Encounter (Signed)
(  See Below for beginning of this note) - Ramipril and Lasix were discontinued upon hospital discharge on 06/23/12

## 2012-07-26 NOTE — Telephone Encounter (Signed)
Spoke with patient, who states his blood pressure has been running 160 s systolic since

## 2012-07-26 NOTE — Telephone Encounter (Signed)
Attempted to contact patient to obtain more information.

## 2012-07-28 NOTE — Telephone Encounter (Signed)
Appointment made for Monday 1/20.  Pt notified that medication issues would be addressed at that visit.

## 2012-07-28 NOTE — Telephone Encounter (Signed)
First available appt with KL.

## 2012-07-29 ENCOUNTER — Encounter (HOSPITAL_COMMUNITY): Payer: Self-pay

## 2012-07-29 ENCOUNTER — Observation Stay (HOSPITAL_COMMUNITY)
Admission: EM | Admit: 2012-07-29 | Discharge: 2012-07-30 | Disposition: A | Discharge status: Home / Self Care | Payer: Medicare Other | Attending: Internal Medicine | Admitting: Internal Medicine

## 2012-07-29 ENCOUNTER — Emergency Department (HOSPITAL_COMMUNITY): Payer: Medicare Other

## 2012-07-29 DIAGNOSIS — R531 Weakness: Secondary | ICD-10-CM

## 2012-07-29 DIAGNOSIS — I255 Ischemic cardiomyopathy: Secondary | ICD-10-CM | POA: Diagnosis present

## 2012-07-29 DIAGNOSIS — I10 Essential (primary) hypertension: Secondary | ICD-10-CM | POA: Diagnosis present

## 2012-07-29 DIAGNOSIS — I679 Cerebrovascular disease, unspecified: Secondary | ICD-10-CM | POA: Diagnosis present

## 2012-07-29 DIAGNOSIS — I951 Orthostatic hypotension: Secondary | ICD-10-CM

## 2012-07-29 DIAGNOSIS — I509 Heart failure, unspecified: Secondary | ICD-10-CM

## 2012-07-29 DIAGNOSIS — I25810 Atherosclerosis of coronary artery bypass graft(s) without angina pectoris: Secondary | ICD-10-CM

## 2012-07-29 DIAGNOSIS — I5042 Chronic combined systolic (congestive) and diastolic (congestive) heart failure: Secondary | ICD-10-CM | POA: Diagnosis present

## 2012-07-29 DIAGNOSIS — R5383 Other fatigue: Secondary | ICD-10-CM

## 2012-07-29 DIAGNOSIS — F0390 Unspecified dementia without behavioral disturbance: Secondary | ICD-10-CM | POA: Diagnosis present

## 2012-07-29 DIAGNOSIS — I2510 Atherosclerotic heart disease of native coronary artery without angina pectoris: Secondary | ICD-10-CM | POA: Diagnosis present

## 2012-07-29 DIAGNOSIS — E119 Type 2 diabetes mellitus without complications: Secondary | ICD-10-CM

## 2012-07-29 DIAGNOSIS — E785 Hyperlipidemia, unspecified: Secondary | ICD-10-CM | POA: Diagnosis present

## 2012-07-29 DIAGNOSIS — N400 Enlarged prostate without lower urinary tract symptoms: Secondary | ICD-10-CM

## 2012-07-29 DIAGNOSIS — R110 Nausea: Secondary | ICD-10-CM | POA: Insufficient documentation

## 2012-07-29 DIAGNOSIS — R739 Hyperglycemia, unspecified: Secondary | ICD-10-CM

## 2012-07-29 DIAGNOSIS — R079 Chest pain, unspecified: Secondary | ICD-10-CM

## 2012-07-29 DIAGNOSIS — I2119 ST elevation (STEMI) myocardial infarction involving other coronary artery of inferior wall: Secondary | ICD-10-CM

## 2012-07-29 DIAGNOSIS — I200 Unstable angina: Secondary | ICD-10-CM

## 2012-07-29 DIAGNOSIS — I2589 Other forms of chronic ischemic heart disease: Secondary | ICD-10-CM | POA: Insufficient documentation

## 2012-07-29 DIAGNOSIS — R0602 Shortness of breath: Secondary | ICD-10-CM | POA: Insufficient documentation

## 2012-07-29 LAB — CBC WITH DIFFERENTIAL/PLATELET
Basophils Absolute: 0 10*3/uL (ref 0.0–0.1)
Eosinophils Absolute: 0.2 10*3/uL (ref 0.0–0.7)
Eosinophils Relative: 3 % (ref 0–5)
HCT: 36.6 % — ABNORMAL LOW (ref 39.0–52.0)
Hemoglobin: 12 g/dL — ABNORMAL LOW (ref 13.0–17.0)
Lymphocytes Relative: 18 % (ref 12–46)
Lymphs Abs: 1.2 10*3/uL (ref 0.7–4.0)
MCH: 29.3 pg (ref 26.0–34.0)
MCV: 89.5 fL (ref 78.0–100.0)
Monocytes Relative: 9 % (ref 3–12)
RBC: 4.09 MIL/uL — ABNORMAL LOW (ref 4.22–5.81)
RDW: 13.7 % (ref 11.5–15.5)

## 2012-07-29 LAB — GLUCOSE, CAPILLARY: Glucose-Capillary: 179 mg/dL — ABNORMAL HIGH (ref 70–99)

## 2012-07-29 LAB — PRO B NATRIURETIC PEPTIDE
Pro B Natriuretic peptide (BNP): 863.6 pg/mL — ABNORMAL HIGH (ref 0–450)
Pro B Natriuretic peptide (BNP): 845.2 pg/mL — ABNORMAL HIGH (ref 0–450)

## 2012-07-29 LAB — BASIC METABOLIC PANEL
BUN: 12 mg/dL (ref 6–23)
CO2: 22 mEq/L (ref 19–32)
Calcium: 9.7 mg/dL (ref 8.4–10.5)
GFR calc non Af Amer: 80 mL/min — ABNORMAL LOW (ref >90)
Glucose, Bld: 213 mg/dL — ABNORMAL HIGH (ref 70–99)

## 2012-07-29 LAB — TROPONIN I: Troponin I: <0.3 ng/mL (ref <0.30)

## 2012-07-29 MED ORDER — PANTOPRAZOLE SODIUM 40 MG PO TBEC
40.0000 mg | DELAYED_RELEASE_TABLET | Freq: Two times a day (BID) | ORAL | Status: DC
  Administered 2012-07-29: 40 mg via ORAL
  Filled 2012-07-29: qty 1

## 2012-07-29 MED ORDER — ENOXAPARIN SODIUM 40 MG/0.4ML ~~LOC~~ SOLN
40.0000 mg | SUBCUTANEOUS | Status: DC
  Administered 2012-07-29: 40 mg via SUBCUTANEOUS
  Filled 2012-07-29: qty 0.4

## 2012-07-29 MED ORDER — CARVEDILOL 3.125 MG PO TABS
3.1250 mg | ORAL_TABLET | Freq: Two times a day (BID) | ORAL | Status: DC
  Administered 2012-07-30: 3.125 mg via ORAL
  Filled 2012-07-29 (×2): qty 1

## 2012-07-29 MED ORDER — ACETAMINOPHEN 650 MG RE SUPP: 650.0000 mg | Freq: Four times a day (QID) | RECTAL | Status: DC | PRN

## 2012-07-29 MED ORDER — ACETAMINOPHEN 325 MG PO TABS: 650.0000 mg | ORAL_TABLET | Freq: Four times a day (QID) | ORAL | Status: DC | PRN

## 2012-07-29 MED ORDER — ONDANSETRON HCL 4 MG/2ML IJ SOLN: 4.0000 mg | Freq: Three times a day (TID) | INTRAMUSCULAR | Status: AC | PRN

## 2012-07-29 MED ORDER — SODIUM CHLORIDE 0.9 % IJ SOLN
3.0000 mL | Freq: Two times a day (BID) | INTRAMUSCULAR | Status: DC
  Administered 2012-07-29: 3 mL via INTRAVENOUS

## 2012-07-29 MED ORDER — NITROFURANTOIN MACROCRYSTAL 100 MG PO CAPS
100.0000 mg | ORAL_CAPSULE | Freq: Every day | ORAL | Status: DC
  Administered 2012-07-29: 100 mg via ORAL
  Filled 2012-07-29 (×4): qty 1

## 2012-07-29 MED ORDER — ASPIRIN EC 81 MG PO TBEC
81.0000 mg | DELAYED_RELEASE_TABLET | Freq: Every day | ORAL | Status: DC
  Administered 2012-07-30: 81 mg via ORAL
  Filled 2012-07-29: qty 1

## 2012-07-29 MED ORDER — DOCUSATE SODIUM 100 MG PO CAPS
200.0000 mg | ORAL_CAPSULE | Freq: Every day | ORAL | Status: DC
  Administered 2012-07-29: 200 mg via ORAL
  Filled 2012-07-29: qty 2

## 2012-07-29 MED ORDER — TICAGRELOR 90 MG PO TABS
90.0000 mg | ORAL_TABLET | Freq: Two times a day (BID) | ORAL | Status: DC
  Administered 2012-07-29 – 2012-07-30 (×2): 90 mg via ORAL
  Filled 2012-07-29 (×8): qty 1

## 2012-07-29 MED ORDER — RAMIPRIL 2.5 MG PO CAPS
5.0000 mg | ORAL_CAPSULE | Freq: Every day | ORAL | Status: DC
  Administered 2012-07-29: 5 mg via ORAL
  Filled 2012-07-29: qty 2

## 2012-07-29 MED ORDER — SENNA 8.6 MG PO TABS
1.0000 | ORAL_TABLET | Freq: Two times a day (BID) | ORAL | Status: DC
  Administered 2012-07-29 – 2012-07-30 (×2): 8.6 mg via ORAL
  Filled 2012-07-29 (×2): qty 1

## 2012-07-29 MED ORDER — INSULIN ASPART 100 UNIT/ML ~~LOC~~ SOLN
0.0000 [IU] | Freq: Three times a day (TID) | SUBCUTANEOUS | Status: DC
  Administered 2012-07-29: 3 [IU] via SUBCUTANEOUS
  Administered 2012-07-30: 2 [IU] via SUBCUTANEOUS

## 2012-07-29 MED ORDER — ONDANSETRON HCL 4 MG/2ML IJ SOLN: 4.0000 mg | Freq: Four times a day (QID) | INTRAMUSCULAR | Status: DC | PRN

## 2012-07-29 MED ORDER — ASPIRIN 81 MG PO CHEW: 81.0000 mg | CHEWABLE_TABLET | Freq: Every day | ORAL | Status: DC

## 2012-07-29 MED ORDER — NITROGLYCERIN 0.4 MG SL SUBL
0.4000 mg | SUBLINGUAL_TABLET | SUBLINGUAL | Status: DC | PRN
Start: 2012-07-29 — End: 2012-07-30

## 2012-07-29 MED ORDER — MORPHINE SULFATE 2 MG/ML IJ SOLN: 2.0000 mg | INTRAMUSCULAR | Status: DC | PRN

## 2012-07-29 MED ORDER — ADULT MULTIVITAMIN W/MINERALS CH
1.0000 | ORAL_TABLET | Freq: Every day | ORAL | Status: DC
  Administered 2012-07-30: 1 via ORAL
  Filled 2012-07-29 (×2): qty 1

## 2012-07-29 MED ORDER — PANTOPRAZOLE SODIUM 40 MG PO TBEC
40.0000 mg | DELAYED_RELEASE_TABLET | ORAL | Status: DC
  Administered 2012-07-30: 40 mg via ORAL
  Filled 2012-07-29 (×2): qty 1

## 2012-07-29 MED ORDER — OMEGA-3-ACID ETHYL ESTERS 1 G PO CAPS
1.0000 g | ORAL_CAPSULE | Freq: Every day | ORAL | Status: DC
  Administered 2012-07-29 – 2012-07-30 (×2): 1 g via ORAL
  Filled 2012-07-29 (×2): qty 1

## 2012-07-29 MED ORDER — ALUM & MAG HYDROXIDE-SIMETH 200-200-20 MG/5ML PO SUSP: 30.0000 mL | Freq: Four times a day (QID) | ORAL | Status: DC | PRN

## 2012-07-29 MED ORDER — TAMSULOSIN HCL 0.4 MG PO CAPS
0.4000 mg | ORAL_CAPSULE | Freq: Every day | ORAL | Status: DC
  Administered 2012-07-29: 0.4 mg via ORAL
  Filled 2012-07-29: qty 1

## 2012-07-29 MED ORDER — ONDANSETRON HCL 4 MG PO TABS: 4.0000 mg | ORAL_TABLET | Freq: Four times a day (QID) | ORAL | Status: DC | PRN

## 2012-07-29 MED ORDER — ATORVASTATIN CALCIUM 40 MG PO TABS
80.0000 mg | ORAL_TABLET | Freq: Every day | ORAL | Status: DC
  Administered 2012-07-29: 80 mg via ORAL
  Filled 2012-07-29: qty 2

## 2012-07-29 MED ORDER — CALCIUM CARBONATE ANTACID 500 MG PO CHEW
1.0000 | CHEWABLE_TABLET | Freq: Three times a day (TID) | ORAL | Status: DC
  Administered 2012-07-29 – 2012-07-30 (×3): 200 mg via ORAL
  Filled 2012-07-29 (×3): qty 1

## 2012-07-29 MED ORDER — ISOSORBIDE MONONITRATE ER 60 MG PO TB24
30.0000 mg | ORAL_TABLET | Freq: Every day | ORAL | Status: DC
  Administered 2012-07-29 – 2012-07-30 (×2): 30 mg via ORAL
  Filled 2012-07-29 (×2): qty 1

## 2012-07-29 MED ORDER — ALBUTEROL SULFATE HFA 108 (90 BASE) MCG/ACT IN AERS: 2.0000 | INHALATION_SPRAY | Freq: Four times a day (QID) | RESPIRATORY_TRACT | Status: DC | PRN

## 2012-07-29 NOTE — H&P (Signed)
[facility] History and Physical  Jeffrey [name] DOB: 1929-03-29 DOA: 07/29/2012  Referring physician: Dr. Manus Gunning PCP: [name], MD  Specialists: Dr. [place] Bing  Chief Complaint: Chest pain  HPI: Jeffrey [name] is a 79 y.o. male with a history of ischemic cardiomyopathy with 9 stents per his report. He had his last MI in December 2013 and has an LVEF of 40%. He also has history of diabetes mellitus, peptic ulcer disease, chronic back pain, and stroke.  [name] reports that chest pain woke him from sleep this morning at 6:30 AM. The pain did not radiate. It was difficult for him to give a description of the pain he said it did not feel like indigestion. He took Maisie Fus and a nitroglycerin and the pain resolved within a few minutes. Unfortunately it returned again in approximately 10:30 AM. At that time he called EMS and was brought to the emergency department. He tells me that he is slightly short of breath but no worse than usual, he did not have any vomiting dizziness or headache. With his previous MI he felt numbness in his arms, he had no numbness today. He lives alone and is able to get around his house without difficulty. He is eating well. No recent illness. He tells me he is slightly constipated.   Review of Systems: The patient denies anorexia, fever, weight loss,, vision loss, hoarseness,  syncope, dyspnea on exertion, peripheral edema, balance deficits, hemoptysis, abdominal pain, melena, hematochezia, severe indigestion/heartburn, hematuria, incontinence, genital sores, muscle weakness, suspicious skin lesions, transient blindness, difficulty walking, depression, unusual weight change, abnormal bleeding, enlarged lymph nodes, angioedema, and breast masses.    Past Medical History  Diagnosis Date  . Diabetes mellitus, type II   . Hyperlipidemia     Lipid profile in 02/2012:135, 227, 41, 49  . Arteriosclerotic cardiovascular disease (ASCVD)    a. CABG x 4 in 1989 (VG->OM1->OM2, VG->RCA, LIMA->LAD), b. 05/2010: DES to VG-OM1/OM2, DES to distal LCx. c. NSTEMI in 04/2011 - TO distal LCX stent and VG->OM2. d. 02/2012 NSTEMI DES to VG-OM1/continuation to OM2 occluded. e. inferior STEMI s/p DES to [facility] 06/2012.  Marland Kitchen Hypertension   . Osteoarthritis   . CVA (cerebral infarction)     details unclear. pt reports light stroke last year involving L leg. No imaging to suggest hemorrhagic etiology  . Pneumonia   . Cervical vertebral fracture   . Chronic back pain   . Benign prostatic hypertrophy     Required urinary catheter x4 weeks for retention  . Peptic ulcer disease   . Gastroesophageal reflux disease   . Ischemic cardiomyopathy     EF 40% 06/2012 (previously 50-55% 03/2012)  . DM (diabetes mellitus)    Past Surgical History  Procedure Date  . Coronary angioplasty with stent placement     2011, 02/2012  . Tonsillectomy   . Coronary artery bypass graft    Social History:  reports that he quit smoking about 51 years ago. His smoking use included Cigarettes. He has a 20 pack-year smoking history. His smokeless tobacco use includes Chew. He reports that he does not drink alcohol or use illicit drugs. He lives at home alone, performs all of his ADLs. Has a history of tobacco use both cigarettes and chew. He no longer uses tobacco he denies any recreational drug or alcohol use. He has a daughter and granddaughter who are involved with his care.  No Known Allergies  Family History  Problem Relation Age of Onset  .  Early death      Parents died young, unclear reasons   he was unable to give me a family history  Prior to Admission medications   Medication Sig Start Date End Date Taking? Authorizing Provider  albuterol (PROVENTIL HFA;VENTOLIN HFA) 108 (90 BASE) MCG/ACT inhaler Inhale 2 puffs into the lungs every 6 (six) hours as needed. For shortness of breath   Yes Historical Provider, MD  aspirin 81 MG chewable tablet Chew 1 tablet (81  mg total) by mouth daily. 06/21/12  Yes Roger A Arguello, PA-C  atorvastatin (LIPITOR) 80 MG tablet Take 1 tablet (80 mg total) by mouth daily at 6 PM. 06/21/12  Yes Roger A Arguello, PA-C  B Complex-Biotin-FA (B-COMPLEX PO) Take 1 tablet by mouth daily.    Yes Historical Provider, MD  calcium carbonate (TUMS - DOSED IN MG ELEMENTAL CALCIUM) 500 MG chewable tablet Chew 1 tablet by mouth 4 (four) times daily - after meals and at bedtime.   Yes Historical Provider, MD  carvedilol (COREG) 3.125 MG tablet Take 1 tablet (3.125 mg total) by mouth 2 (two) times daily with a meal. 07/26/12  Yes Kathlen Brunswick, MD  Docusate Sodium (STOOL SOFTENER) 100 MG capsule Take 100 mg by mouth at bedtime. Constipation   Yes Historical Provider, MD  fish oil-omega-3 fatty acids 1000 MG capsule Take 1 g by mouth daily.   Yes Historical Provider, MD  metFORMIN (GLUCOPHAGE) 500 MG tablet Take 500 mg by mouth 2 (two) times daily. 03/12/12  Yes Ok Anis, NP  Multiple Vitamin (MULTIVITAMIN WITH MINERALS) TABS Take 1 tablet by mouth daily.   Yes Historical Provider, MD  nitrofurantoin (MACRODANTIN) 100 MG capsule Take 100 mg by mouth at bedtime.   Yes Historical Provider, MD  pantoprazole (PROTONIX) 40 MG tablet Take 40 mg by mouth every morning. 12/17/11  Yes Christiane Ha, MD  potassium chloride (K-DUR,KLOR-CON) 10 MEQ tablet Take 1 tablet (10 mEq total) by mouth daily. 06/21/12  Yes Roger A Arguello, PA-C  saxagliptin HCl (ONGLYZA) 5 MG TABS tablet Take 5 mg by mouth daily at 12 noon.    Yes Historical Provider, MD  Tamsulosin HCl (FLOMAX) 0.4 MG CAPS Take 0.4 mg by mouth at bedtime. For urinary health   Yes Historical Provider, MD  Ticagrelor (BRILINTA) 90 MG TABS tablet Take 1 tablet (90 mg total) by mouth 2 (two) times daily. 06/21/12  Yes Roger A Arguello, PA-C  nitroGLYCERIN (NITROSTAT) 0.4 MG SL tablet Place 1 tablet (0.4 mg total) under the tongue every 5 (five) minutes x 3 doses as needed for chest pain.  03/12/12 03/12/13  Ok Anis, NP   Physical Exam: Filed Vitals:   07/29/12 1300 07/29/12 1354 07/29/12 1400 07/29/12 1551  BP: 120/67 133/63 122/62 135/81  Pulse:  75 73 76  Temp:  98.4 F (36.9 C)  97.6 F (36.4 C)  TempSrc:  Oral  Oral  Resp: 18 20  20   SpO2:  100% 99% 99%     General:  Well-developed well-nourished Caucasian male sitting up on the ED bed eating chicken and dumplings. No apparent distress  Eyes: Sclera clear conjunctiva pain, pupils equal round  ENT: Mucous membranes are moist, oropharynx is pink without exudates  Neck: Supple, without obvious JVD  Cardiovascular: Regular rate and rhythm, no murmurs rubs or gallops, no lower extremity edema  Respiratory: Clear to auscultation, no wheezes crackles or rales  Abdomen: Soft, nontender, nondistended, positive bowel sounds, no masses  Skin: Without rashes  or bruises. He has multiple moles on his head, a bruise under his left thumbnail  Musculoskeletal: Able to move all 4 extremities symmetric strength in each  Psychiatric: Alert and oriented, cooperative, appropriate  Neurologic: Hard of hearing, other than that,  cranial nerves II through XII appear grossly intact, nonfocal.  Labs on Admission:  Basic Metabolic Panel:  Lab 07/29/12 1191  NA 132*  K 4.5  CL 97  CO2 22  GLUCOSE 213*  BUN 12  CREATININE 0.84  CALCIUM 9.7  MG --  PHOS --   CBC:  Lab 07/29/12 1340  WBC 6.5  NEUTROABS 4.6  HGB 12.0*  HCT 36.6*  MCV 89.5  PLT 141*   Cardiac Enzymes:  Lab 07/29/12 1241  CKTOTAL --  CKMB --  CKMBINDEX --  TROPONINI <0.30    BNP (last 3 results)  Basename 04/17/12 1521 03/22/12 0445 03/21/12 1821  PROBNP 438.7 1257.0* 1387.0*   CBG:  Lab 07/29/12 1234  GLUCAP 179*    Radiological Exams on Admission: Dg Chest Portable 1 View  07/29/2012  *RADIOLOGY REPORT*  Clinical Data: Chest pain.  PORTABLE CHEST - 1 VIEW  Comparison: Two-view chest 07/15/2012.  Findings: The heart  size is normal.  The lungs are clear.  The lung volumes are low.  The the patient is status post median sternotomy for CABG.  The visualized soft tissues and bony thorax are unremarkable.  IMPRESSION:  1.  Low lung volumes. 2.  No acute cardiopulmonary disease.   Original Report Authenticated By: Marin Roberts, M.D.     EKG: Sinus rhythm with occasional PVCs  Assessment/Plan Active Problems:  Hyperlipidemia  Dementia  Arteriosclerotic cardiovascular disease (ASCVD)  Hypertension  Cerebrovascular disease  Cardiomyopathy, ischemic  Chronic combined systolic and diastolic CHF (congestive heart failure)  CAD (coronary artery disease) of artery bypass graft  Chest pain  Chest pain  Resolved in the emergency department  Initial troponins are within normal limits  Chest x-ray is clear   EKG is unrevealing, will check a repeat EKG in several hours Dr. Dietrich Pates has been consulted given his recent MI Will cycle serial troponins, continue daily aspirin, oxygen as needed, sublingual nitroglycerin as needed Anticipate discharge to home Friday, January 17 if workup is negative  Atherosclerotic cardiovascular disease Appears stable, continue home medications  Hypertension Well-controlled Continue home medications  Chronic combined systolic and diastolic CHF Clinical exam does not show signs of heart failure exacerbation Will check BNP  Diabetes Will hold home medications Manage CBGs with sliding scale insulin  Other comorbidities including history stroke, hyperlipidemia and mild dementia appears stable at this time. Home medications will be continued.  Consults  Cardiology (Dr. Dietrich Pates) was consulted by the EDP.  Code Status: Full, "try to recessuscitate me but don't beat a dead horse" no long term life support Family Communication:  Disposition Plan: To home, within 24 hours.  Time spent: 60 minutes  York, Marianne L, P.A.-C.  [facility] Pager [phone] If  7PM-7AM, please contact night-coverage [email or web address] Password [facility] 07/29/2012, 4:05 PM  Attending This pain may represent cardiac pain vs anxiety. Add Imdur to medication regimen. Appreciate Cardiology consultation.

## 2012-07-29 NOTE — Consult Note (Signed)
CARDIOLOGY CONSULT NOTE  Patient ID: Jeffrey [name] MRN: [id number] DOB/AGE: [birth year] 77 y.o.  Admit date: 07/29/2012 Referring Physician: PTH Primary PhysicianFUSCO,LAWRENCE J., MD Primary Cardiologist:Ginette Bradway Reason for Consultation: Chest pain in the setting of known CAD Active Problems:  Arteriosclerotic cardiovascular disease (ASCVD)  Hypertension  Cardiomyopathy, ischemic  Chest pain  Hyperlipidemia  Dementia  Cerebrovascular disease  Chronic combined systolic and diastolic CHF (congestive heart failure)  CAD (coronary artery disease) of artery bypass graft  HPI: [name] is an 79 y/o M with history of CVA, DM, h/o postural dizziness, and CAD s/p multiple PCI who was discharged 12/9 following admission for inferior STEMI treated with DES to proximal SVG-ramus. EF was noted at 40% post-MI, previously 50-55% on DAPT with Brilinta and ASA indefinitely. Recently admitted after procedure to [facility] for intermittent dizziness and weakness with titration down of antihypertensives. He was admitted to the emergency room with 2 episodes of moderate epigastric to midsternal discomfort, non radiating, reminiscent of prior MI symptoms. This was associated with transient shortness of breath and nausea during the episode. He treated himself at home with NTG X1, a regular aspirin and several baby aspirin along with medication for nausea. Due to his symptoms, and history of CAD with MI approximately one month ago, the patient sought medical attention.      He was found to be normotensive 123/61  mildly hyponatremic with a level of 132. Initial troponin was negative at less than 0.30.  He did not have elevated white blood cells, he was not anemic, he was found, however, to be hyperglycemic with a glucose of 213. EKG did not show acute coronary syndrome. He was in sinus rhythm, PVCs are noted, with nonspecific T-wave abnormalities with T-wave inversion in the inferior leads. Compared to previous  EKG in Dec of 2013 T-wave inversion is less prominent in the V6. It is difficult to have him describe specifically the pain he is experiencing or the duration. He is very vague in the description and cannot qualify it. He is currently pain-free and has been since admission to the ER.  Review of systems complete and found to be negative unless listed above   Past Medical History  Diagnosis Date  . Diabetes mellitus, type II   . Hyperlipidemia     Lipid profile in 02/2012:135, 227, 41, 49  . Arteriosclerotic cardiovascular disease (ASCVD)     a. CABG x 4 in 1989 (VG->OM1->OM2, VG->RCA, LIMA->LAD), b. 05/2010: DES to VG-OM1/OM2, DES to distal LCx. c. NSTEMI in 04/2011 - TO distal LCX stent and VG->OM2. d. 02/2012 NSTEMI DES to VG-OM1/continuation to OM2 occluded. e. inferior STEMI s/p DES to [facility] 06/2012.  Marland Kitchen Hypertension   . Osteoarthritis   . CVA (cerebral infarction)     details unclear. pt reports light stroke last year involving L leg. No imaging to suggest hemorrhagic etiology  . Pneumonia   . Cervical vertebral fracture   . Chronic back pain   . Benign prostatic hypertrophy     Required urinary catheter x4 weeks for retention  . Peptic ulcer disease   . Gastroesophageal reflux disease   . Ischemic cardiomyopathy     EF 40% 06/2012 (previously 50-55% 03/2012)  . DM (diabetes mellitus)     Family History  Problem Relation Age of Onset  . Early death      Parents died young, unclear reasons    History   Social History  . Marital Status: Divorced    Spouse Name: N/A  Number of Children: N/A  . Years of Education: N/A   Occupational History  . Retired     Probation officer   Social History Main Topics  . Smoking status: Former Smoker -- 2.0 packs/day for 10 years    Types: Cigarettes    Quit date: 07/14/1961  . Smokeless tobacco: Current User    Types: Chew  . Alcohol Use: No  . Drug Use: No  . Sexually Active: No   Other Topics Concern  . Not on file   Social History  Narrative   ** Merged History Encounter **     Past Surgical History  Procedure Date  . Coronary angioplasty with stent placement     2011, 02/2012  . Tonsillectomy   . Coronary artery bypass graft     Prescriptions prior to admission  Medication Sig Dispense Refill  . albuterol (PROVENTIL HFA;VENTOLIN HFA) 108 (90 BASE) MCG/ACT inhaler Inhale 2 puffs into the lungs every 6 (six) hours as needed. For shortness of breath      . aspirin 81 MG chewable tablet Chew 1 tablet (81 mg total) by mouth daily.      Marland Kitchen atorvastatin (LIPITOR) 80 MG tablet Take 1 tablet (80 mg total) by mouth daily at 6 PM.  30 tablet  3  . B Complex-Biotin-FA (B-COMPLEX PO) Take 1 tablet by mouth daily.       . calcium carbonate (TUMS - DOSED IN MG ELEMENTAL CALCIUM) 500 MG chewable tablet Chew 1 tablet by mouth 4 (four) times daily - after meals and at bedtime.      . carvedilol (COREG) 3.125 MG tablet Take 1 tablet (3.125 mg total) by mouth 2 (two) times daily with a meal.  60 tablet  6  . Docusate Sodium (STOOL SOFTENER) 100 MG capsule Take 100 mg by mouth at bedtime. Constipation      . fish oil-omega-3 fatty acids 1000 MG capsule Take 1 g by mouth daily.      . metFORMIN (GLUCOPHAGE) 500 MG tablet Take 500 mg by mouth 2 (two) times daily.      . Multiple Vitamin (MULTIVITAMIN WITH MINERALS) TABS Take 1 tablet by mouth daily.      . nitrofurantoin (MACRODANTIN) 100 MG capsule Take 100 mg by mouth at bedtime.      . pantoprazole (PROTONIX) 40 MG tablet Take 40 mg by mouth every morning.      . potassium chloride (K-DUR,KLOR-CON) 10 MEQ tablet Take 1 tablet (10 mEq total) by mouth daily.  30 tablet  3  . saxagliptin HCl (ONGLYZA) 5 MG TABS tablet Take 5 mg by mouth daily at 12 noon.       . Tamsulosin HCl (FLOMAX) 0.4 MG CAPS Take 0.4 mg by mouth at bedtime. For urinary health      . Ticagrelor (BRILINTA) 90 MG TABS tablet Take 1 tablet (90 mg total) by mouth 2 (two) times daily.  60 tablet  6  . nitroGLYCERIN  (NITROSTAT) 0.4 MG SL tablet Place 1 tablet (0.4 mg total) under the tongue every 5 (five) minutes x 3 doses as needed for chest pain.  25 tablet  3  Cardiac Cath 06/18/12 PCI Data:  Vessel - saphenous vein graft to the intermediate/Segment - proximal  Percent Stenosis (pre) 95%  TIMI-flow 2  Stent 3.0 x 12 mm Promus premier  Percent Stenosis (post) 0%  TIMI-flow (post) 3  Final Conclusions:  1. Severe three-vessel obstructive coronary disease.  2. Chronic occlusion of the saphenous vein  graft to the RCA  3. Patent LIMA graft to the LAD  4. Critical stenosis in the saphenous vein graft to the ramus intermediate branch  5. Successful stenting of the saphenous vein graft to the ramus intermediate branch with a drug-eluting stent   Echocardiogram: 06/19/2012 Left ventricle: Severe hypokinesis of inferior wall and inferolateral wall. The cavity size was moderately dilated. The estimated ejection fraction was 40%. Doppler parameters are consistent with abnormal left ventricular relaxation (grade 1 diastolic dysfunction). - Aortic valve: Sclerosis without stenosis. - Mitral valve: Calcified annulus. - Left atrium: The atrium was moderately dilated. - Right ventricle: Systolic function was mildly reduced. - Right atrium: The atrium was mildly dilated.  Physical Exam: Blood pressure 122/62, pulse 73, temperature 98.4 F (36.9 C), temperature source Oral, resp. rate 20, SpO2 99.00%.  General: Well developed, well nourished, in no acute distress Head: Eyes PERRLA, No xanthomas.   Normal cephalic and atramatic, edentulous  Lungs: Clear bilaterally to auscultation and percussion. Heart: HRRR S1 S2, distant heart sounds without MRG.  Pulses are 2+ & equal.            No carotid bruit. No JVD.  No abdominal bruits. No femoral bruits. Abdomen: Bowel sounds are positive, abdomen soft and non-tender without masses or                  Hernia's noted. Pain is not reproducible with palpation.  Msk:   Back normal, normal gait. Normal strength and tone for age. Extremities: No clubbing, cyanosis or edema.  DP +1 Neuro: Alert and oriented X 3. Psych:  Good affect, responds appropriately  Labs: Lab Results  Component Value Date   WBC 6.5 07/29/2012   HGB 12.0* 07/29/2012   HCT 36.6* 07/29/2012   MCV 89.5 07/29/2012   PLT 141* 07/29/2012    Lab 07/29/12 1241  NA 132*  K 4.5  CL 97  CO2 22  BUN 12  CREATININE 0.84  CALCIUM 9.7  PROT --  BILITOT --  ALKPHOS --  ALT --  AST --  GLUCOSE 213*   Lab Results  Component Value Date   CKTOTAL 206 03/11/2012   CKMB 8.0* 03/11/2012   TROPONINI <0.30 07/29/2012    Lab Results  Component Value Date   CHOL 113 06/19/2012   CHOL 135 03/10/2012   CHOL 119 11/01/2011   Lab Results  Component Value Date   HDL 40 06/19/2012   HDL 41 1/61/0960   HDL 35* 11/01/2011   Lab Results  Component Value Date   LDLCALC 40 06/19/2012   LDLCALC 49 03/10/2012   LDLCALC 58 11/01/2011   Lab Results  Component Value Date   TRIG 165* 06/19/2012   TRIG 227* 03/10/2012   TRIG 131 11/01/2011   Lab Results  Component Value Date   CHOLHDL 2.8 06/19/2012   CHOLHDL 3.3 03/10/2012   CHOLHDL 3.4 11/01/2011   Radiology: Dg Chest Portable 1 View  07/29/2012  *RADIOLOGY REPORT*  Clinical Data: Chest pain.  PORTABLE CHEST - 1 VIEW  Comparison: Two-view chest 07/15/2012.  Findings: The heart size is normal.  The lungs are clear.  The lung volumes are low.  The the patient is status post median sternotomy for CABG.  The visualized soft tissues and bony thorax are unremarkable.  IMPRESSION:  1.  Low lung volumes. 2.  No acute cardiopulmonary disease.   Original Report Authenticated By: Marin Roberts, M.D.    EKG:NSR with occasional PVC's with T-wave flattening lead I, II  and V6. T-wave inversion III. Rate of 81 bpm/   ASSESSMENT AND PLAN:  1. Chest pain in the setting of known CAD: Pain relieved with NTG and ASA. Troponin is negative so far. EKG negative for ACS.  Pain is located in the epigastric area without radiation. He is vague on his symptoms concerning quality and specifics of pain, stating "it just hurt for a second but it woke me up." He is currently on VTE prophylaxis only, and would not start him on higher dose. Will add PPI , he is on protonix 40 mg at home, to medication regimen, continue to cycle cardiac enzymes and EKG.   2.  3 Vessel ASCVD of the native coronary arteries and vein grafts: S/P PCI with DES to the Ramus intermediate due to 95% ISR on 06/18/2012, on DAPT, coreg 3.125 mg BID,   3. Epigastric pain with mild nausea: In the setting of diabetes, consider gastroparesis symptoms  4. Hypertension: On arrival to ER he was normotensive. On last admission mediations were adjusted and titrated downward due to orthostatic hypotension with elimination of ACE inhibitor.   5. Chronic Mixed CHF: No evidence of CHF on this assessment. CXR with no acute cardiopulmonary disease. Echo on last assessment in December 2013 EF of 40%.  6. Diabetes 7. Mild Dementia 8. Situational anxiety due to multiple admissions for MI 9. GERD 10.Chronic Back pain 11. PUD 12. Hx of CVA  Bettey Mare. Lyman Bishop NP [facility] 07/29/2012, 3:43 PM  Cardiology Attending Patient interviewed and examined. Discussed with Joni Reining, NP.  Above note annotated and modified based upon my findings.  Chest pain is atypical. If cardiac markers remain normal and EKG is unremarkable, patient can likely be discharged in the morning. He will probably not require stress testing unless he develops recurrent symptoms.  He was recently advised to discontinue ramipril, apparently as the result of relative hypotension. We will restart that medication at a lower dose.  [place], MD 07/29/2012, 5:58 PM

## 2012-07-29 NOTE — ED Provider Notes (Signed)
History  This chart was scribed for Glynn Octave, MD by Shari Heritage, ED Scribe. The patient was seen in room APA01/APA01. Patient's care was started at 1237.   CSN: [id number]  Arrival date & time 07/29/12  1229   First MD Initiated Contact with Patient 07/29/12 1237      Chief Complaint  Patient presents with  . Chest Pain     The history is provided by the patient. No language interpreter was used.    HPI Comments: Jeffrey [name] is a 79 y.o. male with history of congestive heart failure, MI and congestive heart failure who presents to the Emergency Department complaining of two episodes of moderate to severe, non-radiating midsternal chest pain onset 6 hours ago. Patient was pain free on arrival. He says that the pain woke him up. Patient cannot say exactly how long episodes were, but states "not too long." There was associated shortness of breath and nausea during episode. He says that he took both 1 regular aspirin and several baby aspirin. He states that he also took some anti-nausea medicine earlier this morning which improved nausea. Patient denies dizziness, lightheadedness, abdominal pain or neck pain. Patient says that pain he experienced this morning is similar to pain associated with his recent heart attack in August 2013. Other medical history includes diabetes and hypertension. Patient has a surgical history of CABG (20 years ago), stents (patient reports 9 in the past year) and cardiac catheterization in December 2013.   Past Medical History  Diagnosis Date  . Diabetes mellitus, type II   . Hyperlipidemia     Lipid profile in 02/2012:135, 227, 41, 49  . Arteriosclerotic cardiovascular disease (ASCVD)     a. CABG x 4 in 1989 (VG->OM1->OM2, VG->RCA, LIMA->LAD), b. 05/2010: DES to VG-OM1/OM2, DES to distal LCx. c. NSTEMI in 04/2011 - TO distal LCX stent and VG->OM2. d. 02/2012 NSTEMI DES to VG-OM1/continuation to OM2 occluded. e. inferior STEMI s/p DES to [facility]  06/2012.  Marland Kitchen Hypertension   . Osteoarthritis   . CVA (cerebral infarction)     details unclear. pt reports light stroke last year involving L leg. No imaging to suggest hemorrhagic etiology  . Pneumonia   . Cervical vertebral fracture   . Chronic back pain   . Benign prostatic hypertrophy     Required urinary catheter x4 weeks for retention  . Peptic ulcer disease   . Gastroesophageal reflux disease   . Ischemic cardiomyopathy     EF 40% 06/2012 (previously 50-55% 03/2012)  . DM (diabetes mellitus)     Past Surgical History  Procedure Date  . Coronary angioplasty with stent placement     2011, 02/2012  . Tonsillectomy   . Coronary artery bypass graft     Family History  Problem Relation Age of Onset  . Early death      Parents died young, unclear reasons    History  Substance Use Topics  . Smoking status: Former Smoker -- 2.0 packs/day for 10 years    Types: Cigarettes    Quit date: 07/14/1961  . Smokeless tobacco: Current User    Types: Chew  . Alcohol Use: No      Review of Systems A complete 10 system review of systems was obtained and all systems are negative except as noted in the HPI and PMH.   Allergies  Review of patient's allergies indicates no known allergies.  Home Medications   Current Outpatient Rx  Name  Route  Sig  Dispense  Refill  . ALBUTEROL SULFATE HFA 108 (90 BASE) MCG/ACT IN AERS   Inhalation   Inhale 2 puffs into the lungs every 6 (six) hours as needed. For shortness of breath         . ASPIRIN 81 MG PO CHEW   Oral   Chew 1 tablet (81 mg total) by mouth daily.         . ATORVASTATIN CALCIUM 80 MG PO TABS   Oral   Take 1 tablet (80 mg total) by mouth daily at 6 PM.   30 tablet   3   . B-COMPLEX PO   Oral   Take 1 tablet by mouth daily.          Marland Kitchen CALCIUM CARBONATE ANTACID 500 MG PO CHEW   Oral   Chew 1 tablet by mouth 4 (four) times daily - after meals and at bedtime.         Marland Kitchen CARVEDILOL 3.125 MG PO TABS   Oral    Take 1 tablet (3.125 mg total) by mouth 2 (two) times daily with a meal.   60 tablet   6   . DOCUSATE SODIUM 100 MG PO TABS   Oral   Take 100 mg by mouth at bedtime. Constipation         . OMEGA-3 FATTY ACIDS 1000 MG PO CAPS   Oral   Take 1 g by mouth daily.         Marland Kitchen METFORMIN HCL 500 MG PO TABS   Oral   Take 500 mg by mouth 2 (two) times daily.         . ADULT MULTIVITAMIN W/MINERALS CH   Oral   Take 1 tablet by mouth daily.         Marland Kitchen NITROFURANTOIN MACROCRYSTAL 100 MG PO CAPS   Oral   Take 100 mg by mouth at bedtime.         Marland Kitchen NITROGLYCERIN 0.4 MG SL SUBL   Sublingual   Place 1 tablet (0.4 mg total) under the tongue every 5 (five) minutes x 3 doses as needed for chest pain.   25 tablet   3   . PANTOPRAZOLE SODIUM 40 MG PO TBEC   Oral   Take 40 mg by mouth every morning.         Marland Kitchen POTASSIUM CHLORIDE CRYS ER 10 MEQ PO TBCR   Oral   Take 1 tablet (10 mEq total) by mouth daily.   30 tablet   3   . SAXAGLIPTIN HCL 5 MG PO TABS   Oral   Take 5 mg by mouth daily at 12 noon.          Marland Kitchen TAMSULOSIN HCL 0.4 MG PO CAPS   Oral   Take 0.4 mg by mouth at bedtime. For urinary health         . TICAGRELOR 90 MG PO TABS   Oral   Take 1 tablet (90 mg total) by mouth 2 (two) times daily.   60 tablet   6     Triage Vitals: BP 123/61  Pulse 88  Temp 98.7 F (37.1 C) (Oral)  Resp 20  SpO2 99%  Physical Exam  Constitutional: He is oriented to person, place, and time. He appears well-developed and well-nourished.  HENT:  Head: Normocephalic and atraumatic.  Mouth/Throat: Oropharynx is clear and moist.  Eyes: Conjunctivae normal and EOM are normal. Pupils are equal, round, and reactive to light.  Neck: Neck supple.  Cardiovascular:  Normal rate, regular rhythm, normal heart sounds and normal pulses.   No murmur heard.      Equal pulses.  Pulmonary/Chest: Effort normal and breath sounds normal. No respiratory distress. He has no wheezes. He has no rales.   Abdominal: Soft. Bowel sounds are normal. He exhibits no distension.  Musculoskeletal: He exhibits no edema.       No peripheral edema.  Neurological: He is alert and oriented to person, place, and time.  Skin: Skin is warm. He is not diaphoretic.  Psychiatric: He has a normal mood and affect. His behavior is normal.    ED Course  Procedures (including critical care time) DIAGNOSTIC STUDIES: Oxygen Saturation is 99% on room air, normal by my interpretation.    COORDINATION OF CARE: 12:44 PM- Patient informed of current plan for treatment and evaluation and agrees with plan at this time. Oredered chest x-ray, CBC, metabolic panel and troponin.      Labs Reviewed  CBC WITH DIFFERENTIAL  BASIC METABOLIC PANEL  TROPONIN I   Dg Chest Portable 1 View  07/29/2012  *RADIOLOGY REPORT*  Clinical Data: Chest pain.  PORTABLE CHEST - 1 VIEW  Comparison: Two-view chest 07/15/2012.  Findings: The heart size is normal.  The lungs are clear.  The lung volumes are low.  The the patient is status post median sternotomy for CABG.  The visualized soft tissues and bony thorax are unremarkable.  IMPRESSION:  1.  Low lung volumes. 2.  No acute cardiopulmonary disease.   Original Report Authenticated By: Marin Roberts, M.D.      No diagnosis found.    MDM   patient with CAD status post multiple stents, recent stenting presenting with 2 episodes of chest pain today it improved with nitroglycerin. Similar to previous heart attack. Pain-free on arrival.   EKG unchanged. Troponin negative. Recent LHC reviewed.  Symptoms concerning for angina, complex cardiac history with recent MI.  D/w Dr. Dietrich Pates who knows patient well. Recommends medical admission, he will consult.  Pain free at this time.  Will hold on NTG.    Date: 07/29/2012  Rate: 81  Rhythm: normal sinus rhythm  QRS Axis: normal  Intervals: normal  ST/T Wave abnormalities: nonspecific ST/T changes  Conduction Disutrbances:none   Narrative Interpretation:   Old EKG Reviewed: unchanged  Left heart catheterization: December 2013 Final Conclusions:  1. Severe three-vessel obstructive coronary disease.  2. 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 Patent LIMA graft to the LAD  4. Critical stenosis in the saphenous vein graft to the ramus intermediate branch  5. Successful stenting of the saphenous vein graft to the ramus intermediate branch with a drug-eluting stent      I personally performed the services described in this documentation, which was scribed in my presence. The recorded information has been reviewed and is accurate.    Glynn Octave, MD 07/29/12 [phone]

## 2012-07-29 NOTE — ED Notes (Signed)
Pt arrived by ems from home, was woke from sleep today w/ mid sternal cp that was at 7am, took own sl ntgx1 which relieved his pain. Pain returned approx 1 hour pta and took 4 baby aspirin.  Is pain free at arrival. Denies any cough/fever.  Sob at times, mild nausea

## 2012-07-29 NOTE — ED Notes (Signed)
Report given to rn

## 2012-07-30 DIAGNOSIS — R5383 Other fatigue: Secondary | ICD-10-CM

## 2012-07-30 DIAGNOSIS — I7090 Unspecified atherosclerosis: Secondary | ICD-10-CM

## 2012-07-30 DIAGNOSIS — R5381 Other malaise: Secondary | ICD-10-CM

## 2012-07-30 DIAGNOSIS — I2510 Atherosclerotic heart disease of native coronary artery without angina pectoris: Secondary | ICD-10-CM

## 2012-07-30 LAB — GLUCOSE, CAPILLARY: Glucose-Capillary: 191 mg/dL — ABNORMAL HIGH (ref 70–99)

## 2012-07-30 LAB — CBC
HCT: 35.1 % — ABNORMAL LOW (ref 39.0–52.0)
Hemoglobin: 11.5 g/dL — ABNORMAL LOW (ref 13.0–17.0)
MCH: 29.6 pg (ref 26.0–34.0)
MCV: 90.5 fL (ref 78.0–100.0)
RBC: 3.88 MIL/uL — ABNORMAL LOW (ref 4.22–5.81)

## 2012-07-30 LAB — URINALYSIS, ROUTINE W REFLEX MICROSCOPIC
Bilirubin Urine: NEGATIVE
Glucose, UA: NEGATIVE mg/dL
Hgb urine dipstick: NEGATIVE
Nitrite: NEGATIVE
Specific Gravity, Urine: 1.015 (ref 1.005–1.030)
pH: 8 (ref 5.0–8.0)

## 2012-07-30 LAB — BASIC METABOLIC PANEL
CO2: 26 mEq/L (ref 19–32)
Glucose, Bld: 169 mg/dL — ABNORMAL HIGH (ref 70–99)
Potassium: 4.1 mEq/L (ref 3.5–5.1)
Sodium: 135 mEq/L (ref 135–145)

## 2012-07-30 LAB — TROPONIN I: Troponin I: <0.3 ng/mL (ref <0.30)

## 2012-07-30 MED ORDER — RAMIPRIL 2.5 MG PO CAPS
2.5000 mg | ORAL_CAPSULE | Freq: Every day | ORAL | Status: DC
  Administered 2012-07-30: 2.5 mg via ORAL
  Filled 2012-07-30: qty 1

## 2012-07-30 MED ORDER — ISOSORBIDE MONONITRATE ER 30 MG PO TB24: 15.0000 mg | ORAL_TABLET | Freq: Every day | ORAL | Status: DC

## 2012-07-30 MED ORDER — ISOSORBIDE MONONITRATE ER 30 MG PO TB24: 30.0000 mg | ORAL_TABLET | Freq: Every day | ORAL | Status: DC

## 2012-07-30 MED ORDER — RAMIPRIL 2.5 MG PO CAPS: 2.5000 mg | ORAL_CAPSULE | Freq: Every day | ORAL | Status: DC

## 2012-07-30 NOTE — Progress Notes (Signed)
Arranged for [facility] to come transport pt.  MD are of pt's BP's. Pt is stable at this time, taken to main entrance in wheelchair by staff member.

## 2012-07-30 NOTE — Progress Notes (Signed)
SUBJECTIVE:Depressed, no recurrent chest pain  Principal Problem:  *Chest pain Active Problems:  Arteriosclerotic cardiovascular disease (ASCVD)  Hypertension  Cardiomyopathy, ischemic  Hyperlipidemia  Dementia  Cerebrovascular disease  Chronic combined systolic and diastolic CHF (congestive heart failure)  CAD (coronary artery disease) of artery bypass graft  LABS: Basic Metabolic Panel:  Basename 07/30/12 0359 07/29/12 1241  NA 135 132*  K 4.1 4.5  CL 100 97  CO2 26 22  GLUCOSE 169* 213*  BUN 14 12  CREATININE 1.13 0.84  CALCIUM 10.0 9.7  MG -- --  PHOS -- --   CBC:  Basename 07/30/12 0359 07/29/12 1340  WBC 6.7 6.5  NEUTROABS -- 4.6  HGB 11.5* 12.0*  HCT 35.1* 36.6*  MCV 90.5 89.5  PLT 143* 141*   Cardiac Enzymes:  Basename 07/30/12 0634 07/30/12 0051 07/29/12 1855  CKTOTAL -- -- --  CKMB -- -- --  CKMBINDEX -- -- --  TROPONINI <0.30 <0.30 <0.30   BNP: No components found with this basename: POCBNP:3 D-Dimer: No results found for this basename: DDIMER:2 in the last 72 hours Hemoglobin A1C:  Basename 07/29/12 1855  HGBA1C 8.0*   Fasting Lipid Panel: No results found for this basename: CHOL,HDL,LDLCALC,TRIG,CHOLHDL,LDLDIRECT in the last 72 hours Thyroid Function Tests:  Basename 07/29/12 1340  TSH 0.531  T4TOTAL --  T3FREE --  THYROIDAB --    RADIOLOGY: Dg Chest Portable 1 View  07/29/2012  *RADIOLOGY REPORT*  Clinical Data: Chest pain.  PORTABLE CHEST - 1 VIEW  Comparison: Two-view chest 07/15/2012.  Findings: The heart size is normal.  The lungs are clear.  The lung volumes are low.  The the patient is status post median sternotomy for CABG.  The visualized soft tissues and bony thorax are unremarkable.  IMPRESSION:  1.  Low lung volumes. 2.  No acute cardiopulmonary disease.   Original Report Authenticated By: Marin Roberts, M.D.    PHYSICAL EXAM BP 92/51  Pulse 71  Temp 98.5 F (36.9 C) (Oral)  Resp 18  Ht 5\' 10"  (1.778 m)  Wt  180 lb (81.647 kg)  BMI 25.83 kg/m2  SpO2 93% General: Well developed, well nourished, in no acute distress Head: Eyes PERRLA, No xanthomas.   Normal cephalic and atramatic  Lungs: Clear bilaterally to auscultation and percussion. Heart: HRRR S1 S2, No MRG .  Pulses are 2+ & equal.            No carotid bruit. No JVD.  No abdominal bruits. No femoral bruits. Abdomen: Bowel sounds are positive, abdomen soft and non-tender without masses or                  Hernia's noted. Msk:  Back normal, normal gait. Normal strength and tone for age. Extremities: No clubbing, cyanosis or edema.  DP +1 Neuro: Alert and oriented X 3. Psych:  Good affect, responds appropriately  TELEMETRY: Reviewed telemetry pt in NSR rates in the 60-70's.   ASSESSMENT AND PLAN: 1. Chest pain in the setting of known CAD: Pain relieved with NTG and ASA. Troponin negative. Placed on nitrates by PTH with hypotension noted. EKG negative for ACS. Pain is located in the epigastric area without radiation. Hypersensitive to any chest or abdominal discomfort, with concerns that it is related to pending MI.  2. 3 Vessel ASCVD of the native coronary arteries and vein grafts: S/P PCI with DES to the Ramus intermediate due to 95% ISR on 06/18/2012, on DAPT, coreg 3.125 mg BID,   3. Epigastric  pain with mild nausea: In the setting of diabetes, consider gastroparesis symptoms   4. Hypertension: Lisinopril added back at 5 mg with hypotensive response with addition also of nitrates. Will decrease lisinopril to 2.5 mg and monitor his response. He has follow up in our office on Monday with Dr.Desmond Tufano. He is encouraged to keep it. Can go home from cardiology standpoint.   5. Chronic Mixed CHF: No evidence of CHF on this assessment. CXR with no acute cardiopulmonary disease. Echo on last assessment in December 2013 EF of 40%.  6. Diabetes  7. Mild Dementia  8. Situational anxiety due to multiple admissions for MI: He admits to loneliness and  anxiety about this with no social contacts.  I have spoken to him about Assisted living facility. He is reluctant to entertain the idea at this time. 9. GERD  10.Chronic Back pain  11. PUD  12. Hx of CVA  Bettey Mare. Lyman Bishop NP [facility] 07/30/2012, 8:52 AM  Cardiology Attending Patient interviewed and examined. Discussed with Joni Reining, NP.  Above note annotated and modified based upon my findings.  Blood pressure has been on the low side, but patient is asymptomatic, and pressure will increase once he is more active.  I agree with plans for discharge on current medication.  We will arrange for a return office visit.  Jennings Bing, MD 07/30/2012, 9:16 PM

## 2012-07-30 NOTE — Progress Notes (Signed)
IV removed, site WNL.  Pt given d/c instructions and new prescriptions.  Discussed home care with patient and discussed home medications (new medications), patient verbalizes understanding, teachback completed. F/U appointment in place at [facility] and with Dr. Dietrich Pates, pt states they will keep appointment. Pt is stable at this time. Needs taxi to transport him home, will arrange for pt.

## 2012-07-30 NOTE — Progress Notes (Signed)
UR Chart Review Completed  

## 2012-07-30 NOTE — Discharge Summary (Signed)
Physician Discharge Summary  Jeffrey [name] ZOX:[id number] DOB: July 05, 1929 DOA: 07/29/2012  PCP: [name], MD  Admit date: 07/29/2012 Discharge date: 07/30/2012  Time spent: 60 minutes  Recommendations for Outpatient Follow-up:  Cardiology followup scheduled for Monday, January 20  Discharge Diagnoses:  Principal Problem:  *Chest pain Active Problems:  Hyperlipidemia  Dementia  Arteriosclerotic cardiovascular disease (ASCVD)  Hypertension  Cerebrovascular disease  Cardiomyopathy, ischemic  Chronic combined systolic and diastolic CHF (congestive heart failure)  CAD (coronary artery disease) of artery bypass graft   Discharge Condition: Stable, no further chest pain  Diet recommendation: Heart healthy  Filed Weights   07/29/12 1610  Weight: 81.647 kg (180 lb)    History of present illness:  Jeffrey [name] is a 79 y.o. male with a history of ischemic cardiomyopathy with 9 stents per his report. He had his last MI in December 2013 and has an LVEF of 40%. He also has history of diabetes mellitus, peptic ulcer disease, chronic back pain, and stroke. Jeffrey [name] reports that chest pain woke him from sleep this morning at 6:30 AM. The pain did not radiate. It was difficult for him to give a description of the pain. He said it did not feel like indigestion. He took Tums and a nitroglycerin and the pain resolved within a few minutes. Unfortunately it returned again at approximately 10:30 AM. At that time he called EMS and was brought to the emergency department. He tells me that he is slightly short of breath but no worse than usual, he did not have any vomiting dizziness or headache. With his previous MI he felt numbness in his arms, he has no numbness today. He lives alone and is able to get around his house without difficulty. He is eating well. No recent illness. He tells me he is slightly constipated.   Hospital Course:  Jeffrey [name] troponin levels were cycled and  remained within normal limits. His EKG was unrevealing. After his chest pain was relieved with nitroglycerin and aspirin he had no further chest pain.  He was seen by cardiology who restarted his ramipril at a low dose.  He was seen by [facility] who prescribed low-dose imdur for chest pain. The combination of these 2 additional medications made him slightly hypotensive. Cardiology cut his imdur dose to 15 mg daily. The patient remained stable. It was felt that his pain could have been cardiac in origin, however it also could have been anxiety induced.  At this point he is stable for discharge to home and will followup with Dr. Dietrich Pates on Monday, January 20.  The rest of his comorbidities including combined systolic and diastolic CHF, diabetes, history of stroke, hyperlipidemia, and history of atherosclerotic cardiovascular disease remained quiet and stable during this hospitalization he will resume his prior to admission medications for these comorbidities.  Consultations:  Cardiology  Discharge Exam: Filed Vitals:   07/29/12 1610 07/29/12 2114 07/30/12 0620 07/30/12 1144  BP: 135/81 110/61 92/51 97/57   Pulse: 76 76 71 88  Temp:  97.9 F (36.6 C) 98.5 F (36.9 C)   TempSrc: Oral Oral Oral   Resp: 20 19 18    Height: 5\' 10"  (1.778 m)     Weight: 81.647 kg (180 lb)     SpO2: 99% 98% 93%     General: Alert and oriented, hard of hearing, occasionally loses track of his thoughts during conversation Cardiovascular: Regular rate and rhythm, no murmurs rubs or gallops, no lower extremity edema Respiratory: Clear to auscultation,  no wheezes crackles or rales, no accessory muscle use Abdomen:  Soft, nontender, nondistended, positive bowel sounds, no masses Extremities:  Able to move all 4, good strength   Discharge Instructions  Discharge Orders    Future Appointments: Provider: Department: Dept Phone: Center:   08/02/2012 2:40 PM Jodelle Gross, NP [facility] [place]  [phone] [id number]   08/18/2012 1:00 PM Kathlen Brunswick, MD [facility] [place] [phone] [id number]     Future Orders Please Complete By Expires   Diet - low sodium heart healthy      Increase activity slowly          Medication List     As of 07/30/2012  1:11 PM    TAKE these medications         albuterol 108 (90 BASE) MCG/ACT inhaler   Commonly known as: PROVENTIL HFA;VENTOLIN HFA   Inhale 2 puffs into the lungs every 6 (six) hours as needed. For shortness of breath      aspirin 81 MG chewable tablet   Chew 1 tablet (81 mg total) by mouth daily.      atorvastatin 80 MG tablet   Commonly known as: LIPITOR   Take 1 tablet (80 mg total) by mouth daily at 6 PM.      B-COMPLEX PO   Take 1 tablet by mouth daily.      calcium carbonate 500 MG chewable tablet   Commonly known as: TUMS - dosed in mg elemental calcium   Chew 1 tablet by mouth 4 (four) times daily - after meals and at bedtime.      carvedilol 3.125 MG tablet   Commonly known as: COREG   Take 1 tablet (3.125 mg total) by mouth 2 (two) times daily with a meal.      fish oil-omega-3 fatty acids 1000 MG capsule   Take 1 g by mouth daily.      isosorbide mononitrate 30 MG 24 hr tablet   Commonly known as: IMDUR   Take 0.5 tablets (15 mg total) by mouth daily.      metFORMIN 500 MG tablet   Commonly known as: GLUCOPHAGE   Take 500 mg by mouth 2 (two) times daily.      multivitamin with minerals Tabs   Take 1 tablet by mouth daily.      nitrofurantoin 100 MG capsule   Commonly known as: MACRODANTIN   Take 100 mg by mouth at bedtime.      nitroGLYCERIN 0.4 MG SL tablet   Commonly known as: NITROSTAT   Place 1 tablet (0.4 mg total) under the tongue every 5 (five) minutes x 3 doses as needed for chest pain.      ONGLYZA 5 MG Tabs tablet   Generic drug: saxagliptin HCl   Take 5 mg by mouth daily at 12 noon.      pantoprazole 40 MG tablet   Commonly known as: PROTONIX   Take 40 mg  by mouth every morning.      potassium chloride 10 MEQ tablet   Commonly known as: K-DUR,KLOR-CON   Take 1 tablet (10 mEq total) by mouth daily.      ramipril 2.5 MG capsule   Commonly known as: ALTACE   Take 1 capsule (2.5 mg total) by mouth daily.      STOOL SOFTENER 100 MG capsule   Generic drug: Docusate Sodium   Take 100 mg by mouth at bedtime. Constipation      Tamsulosin HCl 0.4  MG Caps   Commonly known as: FLOMAX   Take 0.4 mg by mouth at bedtime. For urinary health      Ticagrelor 90 MG Tabs tablet   Commonly known as: BRILINTA   Take 1 tablet (90 mg total) by mouth 2 (two) times daily.           Follow-up Information    Follow up with [name], MD. Schedule an appointment as soon as possible for a visit on 08/13/2012. (1:15)    Contact information:   [address] [place] [address] [phone]       Follow up with Chattaroy Bing, MD. On 08/02/2012. (See Dr. Dietrich Pates on Monday at 2:40)    Contact information:   [address] [place] [address] [phone]           The results of significant diagnostics from this hospitalization (including imaging, microbiology, ancillary and laboratory) are listed below for reference.    Significant Diagnostic Studies: Dg Chest 2 View  07/15/2012  *RADIOLOGY REPORT*  Clinical Data: Cough and right chest pain.  History of congestive heart failure.  CHEST - 2 VIEW  Comparison: 06/23/2012 and 06/18/2012 radiographs.  Findings: The heart size and mediastinal contours are stable status post CABG.  The lungs are now clear.  There is no edema, confluent airspace opacity or pleural effusion.  Osseous structures appear unchanged.  IMPRESSION: Resolved left basilar air space disease.  No acute cardiopulmonary process.   Original Report Authenticated By: Carey Bullocks, M.D.    Dg Chest Portable 1 View  07/29/2012  *RADIOLOGY REPORT*  Clinical Data: Chest pain.  PORTABLE CHEST - 1 VIEW   Comparison: Two-view chest 07/15/2012.  Findings: The heart size is normal.  The lungs are clear.  The lung volumes are low.  The the patient is status post median sternotomy for CABG.  The visualized soft tissues and bony thorax are unremarkable.  IMPRESSION:  1.  Low lung volumes. 2.  No acute cardiopulmonary disease.   Original Report Authenticated By: Marin Roberts, M.D.      Labs: Basic Metabolic Panel:  Lab 07/30/12 5366 07/29/12 1241  NA 135 132*  K 4.1 4.5  CL 100 97  CO2 26 22  GLUCOSE 169* 213*  BUN 14 12  CREATININE 1.13 0.84  CALCIUM 10.0 9.7  MG -- --  PHOS -- --   CBC:  Lab 07/30/12 0359 07/29/12 1340  WBC 6.7 6.5  NEUTROABS -- 4.6  HGB 11.5* 12.0*  HCT 35.1* 36.6*  MCV 90.5 89.5  PLT 143* 141*   Cardiac Enzymes:  Lab 07/30/12 0634 07/30/12 0051 07/29/12 1855 07/29/12 1241  CKTOTAL -- -- -- --  CKMB -- -- -- --  CKMBINDEX -- -- -- --  TROPONINI <0.30 <0.30 <0.30 <0.30   BNP: BNP (last 3 results)  Basename 07/29/12 1855 07/29/12 1340 04/17/12 1521  PROBNP 845.2* 863.6* 438.7   CBG:  Lab 07/30/12 1119 07/30/12 0806 07/29/12 2131 07/29/12 1702 07/29/12 1234  GLUCAP 189* 191* 153* 232* 179*       Signed:  Stephani Police, P.A.-C. [phone]  [facility] 07/30/2012, 1:11 PM

## 2012-07-30 NOTE — Care Management Note (Signed)
    Page 1 of 1   07/30/2012     2:55:47 PM   CARE MANAGEMENT NOTE 07/30/2012  Patient:  Jeffrey [name], Jeffrey [name]   Account Number:  [id number]  Date Initiated:  07/30/2012  Documentation initiated by:  Rosemary Holms  Subjective/Objective Assessment:   Pt admitted from home where he lives alone. Currently active with [facility]. To be discharged with same [facility] arrangments     Action/Plan:   Anticipated DC Date:  07/30/2012   Anticipated DC Plan:  HOME W HOME HEALTH SERVICES      DC Planning Services  CM consult      Choice offered to / List presented to:             [facility] agency  [facility]   Status of service:  Completed, signed off Medicare Important Message given?   (If response is "NO", the following Medicare IM given date fields will be blank) Date Medicare IM given:   Date Additional Medicare IM given:    Discharge Disposition:    Per UR Regulation:    If discussed at Long Length of Stay Meetings, dates discussed:    Comments:  07/30/12 Carneshia Raker Leanord Hawking RN BSN CM

## 2012-07-31 ENCOUNTER — Encounter (HOSPITAL_COMMUNITY): Payer: Self-pay | Admitting: *Deleted

## 2012-07-31 ENCOUNTER — Emergency Department (HOSPITAL_COMMUNITY)
Admission: EM | Admit: 2012-07-31 | Discharge: 2012-07-31 | Disposition: A | Discharge status: Home / Self Care | Payer: Medicare Other | Attending: Emergency Medicine | Admitting: Emergency Medicine

## 2012-07-31 DIAGNOSIS — Z8781 Personal history of (healed) traumatic fracture: Secondary | ICD-10-CM | POA: Insufficient documentation

## 2012-07-31 DIAGNOSIS — Z7982 Long term (current) use of aspirin: Secondary | ICD-10-CM | POA: Insufficient documentation

## 2012-07-31 DIAGNOSIS — N400 Enlarged prostate without lower urinary tract symptoms: Secondary | ICD-10-CM | POA: Insufficient documentation

## 2012-07-31 DIAGNOSIS — Z8673 Personal history of transient ischemic attack (TIA), and cerebral infarction without residual deficits: Secondary | ICD-10-CM | POA: Insufficient documentation

## 2012-07-31 DIAGNOSIS — G8929 Other chronic pain: Secondary | ICD-10-CM | POA: Insufficient documentation

## 2012-07-31 DIAGNOSIS — I10 Essential (primary) hypertension: Secondary | ICD-10-CM

## 2012-07-31 DIAGNOSIS — Z8701 Personal history of pneumonia (recurrent): Secondary | ICD-10-CM | POA: Insufficient documentation

## 2012-07-31 DIAGNOSIS — Z87891 Personal history of nicotine dependence: Secondary | ICD-10-CM | POA: Insufficient documentation

## 2012-07-31 DIAGNOSIS — K219 Gastro-esophageal reflux disease without esophagitis: Secondary | ICD-10-CM | POA: Insufficient documentation

## 2012-07-31 DIAGNOSIS — Z9861 Coronary angioplasty status: Secondary | ICD-10-CM | POA: Insufficient documentation

## 2012-07-31 DIAGNOSIS — Z8679 Personal history of other diseases of the circulatory system: Secondary | ICD-10-CM | POA: Insufficient documentation

## 2012-07-31 DIAGNOSIS — Z951 Presence of aortocoronary bypass graft: Secondary | ICD-10-CM | POA: Insufficient documentation

## 2012-07-31 DIAGNOSIS — Z8711 Personal history of peptic ulcer disease: Secondary | ICD-10-CM | POA: Insufficient documentation

## 2012-07-31 DIAGNOSIS — E119 Type 2 diabetes mellitus without complications: Secondary | ICD-10-CM | POA: Insufficient documentation

## 2012-07-31 DIAGNOSIS — E785 Hyperlipidemia, unspecified: Secondary | ICD-10-CM | POA: Insufficient documentation

## 2012-07-31 DIAGNOSIS — M549 Dorsalgia, unspecified: Secondary | ICD-10-CM | POA: Insufficient documentation

## 2012-07-31 DIAGNOSIS — Z79899 Other long term (current) drug therapy: Secondary | ICD-10-CM | POA: Insufficient documentation

## 2012-07-31 NOTE — ED Provider Notes (Signed)
History     CSN: [id number]  Arrival date & time 07/31/12  1191   First MD Initiated Contact with Patient 07/31/12 1943      Chief Complaint  Patient presents with  . Hypertension    (Consider location/radiation/quality/duration/timing/severity/associated sxs/prior treatment) HPI....Marland Kitchenrecent admission to the hospital for chest pain.  Blood pressure has been elevated and Ramipril 2.5 mg daily was added to his regimen. Today his blood pressure was low.   No chest pain, shortness of breath, nausea, diaphoresis.  severity is mild.    Past Medical History  Diagnosis Date  . Diabetes mellitus, type II   . Hyperlipidemia     Lipid profile in 02/2012:135, 227, 41, 49  . Arteriosclerotic cardiovascular disease (ASCVD)     a. CABG x 4 in 1989 (VG->OM1->OM2, VG->RCA, LIMA->LAD), b. 05/2010: DES to VG-OM1/OM2, DES to distal LCx. c. NSTEMI in 04/2011 - TO distal LCX stent and VG->OM2. d. 02/2012 NSTEMI DES to VG-OM1/continuation to OM2 occluded. e. inferior STEMI s/p DES to [facility] 06/2012.  Marland Kitchen Hypertension   . Osteoarthritis   . CVA (cerebral infarction)     details unclear. pt reports light stroke last year involving L leg. No imaging to suggest hemorrhagic etiology  . Pneumonia   . Cervical vertebral fracture   . Chronic back pain   . Benign prostatic hypertrophy     Required urinary catheter x4 weeks for retention  . Peptic ulcer disease   . Gastroesophageal reflux disease   . Ischemic cardiomyopathy     EF 40% 06/2012 (previously 50-55% 03/2012)  . DM (diabetes mellitus)     Past Surgical History  Procedure Date  . Coronary angioplasty with stent placement     2011, 02/2012  . Tonsillectomy   . Coronary artery bypass graft     Family History  Problem Relation Age of Onset  . Early death      Parents died young, unclear reasons    History  Substance Use Topics  . Smoking status: Former Smoker -- 2.0 packs/day for 10 years    Types: Cigarettes    Quit date: 07/14/1961  .  Smokeless tobacco: Current User    Types: Chew  . Alcohol Use: No      Review of Systems  All other systems reviewed and are negative.    Allergies  Review of patient's allergies indicates no known allergies.  Home Medications   Current Outpatient Rx  Name  Route  Sig  Dispense  Refill  . ASPIRIN 81 MG PO CHEW   Oral   Chew 1 tablet (81 mg total) by mouth daily.         . ATORVASTATIN CALCIUM 80 MG PO TABS   Oral   Take 1 tablet (80 mg total) by mouth daily at 6 PM.   30 tablet   3   . B-COMPLEX PO   Oral   Take 1 tablet by mouth daily.          Marland Kitchen CALCIUM CARBONATE ANTACID 500 MG PO CHEW   Oral   Chew 1 tablet by mouth 4 (four) times daily - after meals and at bedtime.         Marland Kitchen CARVEDILOL 3.125 MG PO TABS   Oral   Take 1 tablet (3.125 mg total) by mouth 2 (two) times daily with a meal.   60 tablet   6   . DOCUSATE SODIUM 100 MG PO TABS   Oral   Take 100 mg by mouth  at bedtime. Constipation         . OMEGA-3 FATTY ACIDS 1000 MG PO CAPS   Oral   Take 1 g by mouth daily.         . ISOSORBIDE MONONITRATE ER 30 MG PO TB24   Oral   Take 0.5 tablets (15 mg total) by mouth daily.   30 tablet   0   . METFORMIN HCL 500 MG PO TABS   Oral   Take 500 mg by mouth 2 (two) times daily.         . ADULT MULTIVITAMIN W/MINERALS CH   Oral   Take 1 tablet by mouth daily.         Marland Kitchen NITROFURANTOIN MACROCRYSTAL 100 MG PO CAPS   Oral   Take 100 mg by mouth at bedtime.         Marland Kitchen PANTOPRAZOLE SODIUM 40 MG PO TBEC   Oral   Take 40 mg by mouth every morning.         Marland Kitchen POTASSIUM CHLORIDE CRYS ER 10 MEQ PO TBCR   Oral   Take 1 tablet (10 mEq total) by mouth daily.   30 tablet   3   . RAMIPRIL 2.5 MG PO CAPS   Oral   Take 1 capsule (2.5 mg total) by mouth daily.   30 capsule   0   . SAXAGLIPTIN HCL 5 MG PO TABS   Oral   Take 5 mg by mouth daily at 12 noon.          Marland Kitchen TAMSULOSIN HCL 0.4 MG PO CAPS   Oral   Take 0.4 mg by mouth at bedtime.  For urinary health         . TICAGRELOR 90 MG PO TABS   Oral   Take 1 tablet (90 mg total) by mouth 2 (two) times daily.   60 tablet   6   . ALBUTEROL SULFATE HFA 108 (90 BASE) MCG/ACT IN AERS   Inhalation   Inhale 2 puffs into the lungs every 6 (six) hours as needed. For shortness of breath         . NITROGLYCERIN 0.4 MG SL SUBL   Sublingual   Place 1 tablet (0.4 mg total) under the tongue every 5 (five) minutes x 3 doses as needed for chest pain.   25 tablet   3     BP 143/69  Pulse 88  Temp 98 F (36.7 C) (Oral)  Resp 20  Ht 5\' 10"  (1.778 m)  Wt 180 lb (81.647 kg)  BMI 25.83 kg/m2  SpO2 97%  Physical Exam  Nursing note and vitals reviewed. Constitutional: He is oriented to person, place, and time. He appears well-developed and well-nourished.  HENT:  Head: Normocephalic and atraumatic.  Eyes: Conjunctivae normal and EOM are normal. Pupils are equal, round, and reactive to light.  Neck: Normal range of motion. Neck supple.  Cardiovascular: Normal rate, regular rhythm and normal heart sounds.   Pulmonary/Chest: Effort normal and breath sounds normal.  Abdominal: Soft. Bowel sounds are normal.  Musculoskeletal: Normal range of motion.  Neurological: He is alert and oriented to person, place, and time.  Skin: Skin is warm and dry.  Psychiatric: He has a normal mood and affect.    ED Course  Procedures (including critical care time)  Labs Reviewed - No data to display No results found.   1. Hypertension       MDM  Patient appears well. We discussed blood  pressure management. He will add ACE inhibitor in the morning if his pressure is high.  Has local followup         [name], MD 07/31/12 2038

## 2012-07-31 NOTE — ED Notes (Signed)
Pt explained of wait

## 2012-07-31 NOTE — ED Notes (Signed)
Pt states blood pressure has been high, states, "My head has been beating today" (which has eased).

## 2012-07-31 NOTE — ED Notes (Signed)
BP currently 123/46

## 2012-08-01 NOTE — Discharge Summary (Signed)
Patient seen,note reviewed.Agree with above.

## 2012-08-02 ENCOUNTER — Encounter: Payer: Self-pay | Admitting: Adult Health

## 2012-08-02 ENCOUNTER — Ambulatory Visit (INDEPENDENT_AMBULATORY_CARE_PROVIDER_SITE_OTHER): Payer: Medicare Other | Admitting: Adult Health

## 2012-08-02 VITALS — BP 94/64 | HR 91 | Ht 70.0 in | Wt 180.0 lb

## 2012-08-02 DIAGNOSIS — I10 Essential (primary) hypertension: Secondary | ICD-10-CM

## 2012-08-02 DIAGNOSIS — I2510 Atherosclerotic heart disease of native coronary artery without angina pectoris: Secondary | ICD-10-CM

## 2012-08-02 DIAGNOSIS — I7090 Unspecified atherosclerosis: Secondary | ICD-10-CM

## 2012-08-02 DIAGNOSIS — I255 Ischemic cardiomyopathy: Secondary | ICD-10-CM

## 2012-08-02 DIAGNOSIS — I2589 Other forms of chronic ischemic heart disease: Secondary | ICD-10-CM

## 2012-08-02 NOTE — Patient Instructions (Addendum)
Your physician recommends that you schedule a follow-up appointment in: 1 month  Stop Ramipril

## 2012-08-02 NOTE — Progress Notes (Signed)
HPI: [name] is a difficult and unfortunate 79 y/o patient of  Dr.Rothbart with history of CAD, muitiple PCI's,  with recent hospitalization at [facility] for inferior , STEMI and recent DES to prox SVG-ramus with systolic dysfunction EF of 40%, with readmission to [facility] last week with recurrent chest/epigastric pain. He was ruled out for cardiac etiology of chest discomfort, placed on a PPI and low dose nitrates. He comes today with complaints of diarrhea due to use of Milk of Magnesia due to constipation symptoms. He has history of dementia, depression, hyperlipidemia, combined systolic and diastolic CHF, and Cerebral vascular disease, with CVA and left leg hemiparesis. Despite multiple admissions, the patient continues to avoid having a PCP, or be a part of cardiac rehab, even though this has been suggested and recommended on several office visits and during recent hospitalization.. He has stopped taking ACE inhibitor because it causes too much hypotension. He feels bad today and wants a pill to make him feel better. He states he is active when he feels good, and not when he is feeling badly.    No Known Allergies  Current Outpatient Prescriptions  Medication Sig Dispense Refill  . albuterol (PROVENTIL HFA;VENTOLIN HFA) 108 (90 BASE) MCG/ACT inhaler Inhale 2 puffs into the lungs every 6 (six) hours as needed. For shortness of breath      . aspirin 81 MG chewable tablet Chew 1 tablet (81 mg total) by mouth daily.      Marland Kitchen atorvastatin (LIPITOR) 80 MG tablet Take 1 tablet (80 mg total) by mouth daily at 6 PM.  30 tablet  3  . B Complex-Biotin-FA (B-COMPLEX PO) Take 1 tablet by mouth daily.       . calcium carbonate (TUMS - DOSED IN MG ELEMENTAL CALCIUM) 500 MG chewable tablet Chew 1 tablet by mouth 4 (four) times daily - after meals and at bedtime.      . carvedilol (COREG) 3.125 MG tablet Take 1 tablet (3.125 mg total) by mouth 2 (two) times daily with a meal.  60 tablet  6  . Docusate Sodium (STOOL  SOFTENER) 100 MG capsule Take 100 mg by mouth at bedtime. Constipation      . fish oil-omega-3 fatty acids 1000 MG capsule Take 1 g by mouth daily.      . isosorbide mononitrate (IMDUR) 30 MG 24 hr tablet Take 0.5 tablets (15 mg total) by mouth daily.  30 tablet  0  . metFORMIN (GLUCOPHAGE) 500 MG tablet Take 500 mg by mouth 2 (two) times daily.      . Multiple Vitamin (MULTIVITAMIN WITH MINERALS) TABS Take 1 tablet by mouth daily.      . nitrofurantoin (MACRODANTIN) 100 MG capsule Take 100 mg by mouth at bedtime.      . nitroGLYCERIN (NITROSTAT) 0.4 MG SL tablet Place 1 tablet (0.4 mg total) under the tongue every 5 (five) minutes x 3 doses as needed for chest pain.  25 tablet  3  . pantoprazole (PROTONIX) 40 MG tablet Take 40 mg by mouth every morning.      . potassium chloride (K-DUR,KLOR-CON) 10 MEQ tablet Take 1 tablet (10 mEq total) by mouth daily.  30 tablet  3  . saxagliptin HCl (ONGLYZA) 5 MG TABS tablet Take 5 mg by mouth daily at 12 noon.       . Tamsulosin HCl (FLOMAX) 0.4 MG CAPS Take 0.4 mg by mouth at bedtime. For urinary health      . Ticagrelor (BRILINTA) 90 MG  TABS tablet Take 1 tablet (90 mg total) by mouth 2 (two) times daily.  60 tablet  6  . ramipril (ALTACE) 2.5 MG capsule Take 1 capsule (2.5 mg total) by mouth daily.  30 capsule  0    Past Medical History  Diagnosis Date  . Diabetes mellitus, type II   . Hyperlipidemia     Lipid profile in 02/2012:135, 227, 41, 49  . Arteriosclerotic cardiovascular disease (ASCVD)     a. CABG x 4 in 1989 (VG->OM1->OM2, VG->RCA, LIMA->LAD), b. 05/2010: DES to VG-OM1/OM2, DES to distal LCx. c. NSTEMI in 04/2011 - TO distal LCX stent and VG->OM2. d. 02/2012 NSTEMI DES to VG-OM1/continuation to OM2 occluded. e. inferior STEMI s/p DES to [facility] 06/2012.  Marland Kitchen Hypertension   . Osteoarthritis   . CVA (cerebral infarction)     details unclear. pt reports light stroke last year involving L leg. No imaging to suggest hemorrhagic etiology  .  Pneumonia   . Cervical vertebral fracture   . Chronic back pain   . Benign prostatic hypertrophy     Required urinary catheter x4 weeks for retention  . Peptic ulcer disease   . Gastroesophageal reflux disease   . Ischemic cardiomyopathy     EF 40% 06/2012 (previously 50-55% 03/2012)  . DM (diabetes mellitus)     Past Surgical History  Procedure Date  . Coronary angioplasty with stent placement     2011, 02/2012  . Tonsillectomy   . Coronary artery bypass graft     GNF:AOZHYQ of systems complete and found to be negative unless listed above  PHYSICAL EXAM BP 94/64  Pulse 91  Ht 5\' 10"  (1.778 m)  Wt 180 lb (81.647 kg)  BMI 25.83 kg/m2  SpO2 96%  General: Well developed, well nourished, in no acute distress Head: Eyes PERRLA, No xanthomas sunken,.   Normal cephalic and atramatic  Lungs: Clear bilaterally to auscultation and percussion. Heart: HRRR S1 S2, distant heart sounds,  without MRG.  Pulses are 2+ & equal.            No carotid bruit. No JVD.  No abdominal bruits. No femoral bruits. Abdomen: Bowel sounds are positive, abdomen soft and non-tender without masses or                  Hernia's noted. Msk:  Back normal, normal gait. Normal strength and tone for age. Extremities: No clubbing, cyanosis or edema.  DP +1 Neuro: Alert and oriented X 3. Psych:  Flat affect, responds appropriately    ASSESSMENT AND PLAN

## 2012-08-02 NOTE — Assessment & Plan Note (Signed)
He is without cardiac complaint today, no further chest pain or epigastric pain with use of PPI and nitrates. He is not very active, and energy level is low. He is reluctant to be referred to cardiac rehab, and prefers to exercise at home on his own, when he "feels like it."  He wants a pill to make him feel better, but I doubt any new medication will change anything. He is on optimal medical management, and other than lack of energy, he is doing ok. He has multiple excuses for his lack of motivation to do more for himself other than take his medications and come to ER for recurrent symptoms. He has stopped taking ACE as he feels it drops his BP too much.    This is a difficult situation with him, and I will continue to encourage compliance and increased activity. He will see Dr.Rothbart in one month. Perhaps he needs more frequent follow ups to keep him out of the hospital and under close observation for a while. He is advised to drink more fluids today as he has been having a lot of diarrhea from use of MOM.

## 2012-08-02 NOTE — Assessment & Plan Note (Signed)
Will continue on carvedilol, ASA and follow. EF 40% on last cardiac cath in December of 2013. No ACE at this time as he refuses.

## 2012-08-02 NOTE — Assessment & Plan Note (Signed)
Low normal today. Likely from dehydration from excessive diarrhea with use of laxatives. I have asked him to take in more fluids today to help to rehydrate. He has stopped the ACE inhibitor, which under the conditions, was a good choice as he is hypotensive today. Will see him in one month.

## 2012-08-05 ENCOUNTER — Encounter (HOSPITAL_COMMUNITY): Payer: Self-pay | Admitting: *Deleted

## 2012-08-05 ENCOUNTER — Emergency Department (HOSPITAL_COMMUNITY): Payer: Medicare Other

## 2012-08-05 ENCOUNTER — Emergency Department (HOSPITAL_COMMUNITY)
Admission: EM | Admit: 2012-08-05 | Discharge: 2012-08-05 | Disposition: A | Discharge status: Home / Self Care | Payer: Medicare Other | Attending: Emergency Medicine | Admitting: Emergency Medicine

## 2012-08-05 DIAGNOSIS — N138 Other obstructive and reflux uropathy: Secondary | ICD-10-CM | POA: Insufficient documentation

## 2012-08-05 DIAGNOSIS — R0602 Shortness of breath: Secondary | ICD-10-CM | POA: Insufficient documentation

## 2012-08-05 DIAGNOSIS — Z7982 Long term (current) use of aspirin: Secondary | ICD-10-CM | POA: Insufficient documentation

## 2012-08-05 DIAGNOSIS — Z8673 Personal history of transient ischemic attack (TIA), and cerebral infarction without residual deficits: Secondary | ICD-10-CM | POA: Insufficient documentation

## 2012-08-05 DIAGNOSIS — R42 Dizziness and giddiness: Secondary | ICD-10-CM | POA: Insufficient documentation

## 2012-08-05 DIAGNOSIS — Z8739 Personal history of other diseases of the musculoskeletal system and connective tissue: Secondary | ICD-10-CM | POA: Insufficient documentation

## 2012-08-05 DIAGNOSIS — N401 Enlarged prostate with lower urinary tract symptoms: Secondary | ICD-10-CM | POA: Insufficient documentation

## 2012-08-05 DIAGNOSIS — R531 Weakness: Secondary | ICD-10-CM

## 2012-08-05 DIAGNOSIS — R509 Fever, unspecified: Secondary | ICD-10-CM | POA: Insufficient documentation

## 2012-08-05 DIAGNOSIS — I2510 Atherosclerotic heart disease of native coronary artery without angina pectoris: Secondary | ICD-10-CM | POA: Insufficient documentation

## 2012-08-05 DIAGNOSIS — Z8701 Personal history of pneumonia (recurrent): Secondary | ICD-10-CM | POA: Insufficient documentation

## 2012-08-05 DIAGNOSIS — I10 Essential (primary) hypertension: Secondary | ICD-10-CM | POA: Insufficient documentation

## 2012-08-05 DIAGNOSIS — K219 Gastro-esophageal reflux disease without esophagitis: Secondary | ICD-10-CM | POA: Insufficient documentation

## 2012-08-05 DIAGNOSIS — E785 Hyperlipidemia, unspecified: Secondary | ICD-10-CM | POA: Insufficient documentation

## 2012-08-05 DIAGNOSIS — R079 Chest pain, unspecified: Secondary | ICD-10-CM | POA: Insufficient documentation

## 2012-08-05 DIAGNOSIS — Z951 Presence of aortocoronary bypass graft: Secondary | ICD-10-CM | POA: Insufficient documentation

## 2012-08-05 DIAGNOSIS — Z8781 Personal history of (healed) traumatic fracture: Secondary | ICD-10-CM | POA: Insufficient documentation

## 2012-08-05 DIAGNOSIS — Z8679 Personal history of other diseases of the circulatory system: Secondary | ICD-10-CM | POA: Insufficient documentation

## 2012-08-05 DIAGNOSIS — Z9861 Coronary angioplasty status: Secondary | ICD-10-CM | POA: Insufficient documentation

## 2012-08-05 DIAGNOSIS — Z79899 Other long term (current) drug therapy: Secondary | ICD-10-CM | POA: Insufficient documentation

## 2012-08-05 DIAGNOSIS — Z87891 Personal history of nicotine dependence: Secondary | ICD-10-CM | POA: Insufficient documentation

## 2012-08-05 DIAGNOSIS — E119 Type 2 diabetes mellitus without complications: Secondary | ICD-10-CM | POA: Insufficient documentation

## 2012-08-05 DIAGNOSIS — R5381 Other malaise: Secondary | ICD-10-CM | POA: Insufficient documentation

## 2012-08-05 LAB — CBC WITH DIFFERENTIAL/PLATELET
Basophils Relative: 0 % (ref 0–1)
Eosinophils Absolute: 0.2 10*3/uL (ref 0.0–0.7)
Lymphs Abs: 0.7 10*3/uL (ref 0.7–4.0)
MCH: 29.7 pg (ref 26.0–34.0)
Neutrophils Relative %: 72 % (ref 43–77)
Platelets: 135 10*3/uL — ABNORMAL LOW (ref 150–400)
RBC: 3.7 MIL/uL — ABNORMAL LOW (ref 4.22–5.81)

## 2012-08-05 LAB — URINALYSIS, ROUTINE W REFLEX MICROSCOPIC
Bilirubin Urine: NEGATIVE
Glucose, UA: NEGATIVE mg/dL
Hgb urine dipstick: NEGATIVE
Specific Gravity, Urine: 1.02 (ref 1.005–1.030)
pH: 6 (ref 5.0–8.0)

## 2012-08-05 LAB — COMPREHENSIVE METABOLIC PANEL
ALT: 25 U/L (ref 0–53)
AST: 25 U/L (ref 0–37)
Albumin: 3.5 g/dL (ref 3.5–5.2)
Alkaline Phosphatase: 71 U/L (ref 39–117)
Glucose, Bld: 188 mg/dL — ABNORMAL HIGH (ref 70–99)
Potassium: 4.2 mEq/L (ref 3.5–5.1)
Sodium: 134 mEq/L — ABNORMAL LOW (ref 135–145)
Total Protein: 6.8 g/dL (ref 6.0–8.3)

## 2012-08-05 MED ORDER — SODIUM CHLORIDE 0.9 % IV BOLUS (SEPSIS)
500.0000 mL | Freq: Once | INTRAVENOUS | Status: AC
  Administered 2012-08-05: 500 mL via INTRAVENOUS

## 2012-08-05 NOTE — ED Notes (Signed)
Right sided upper abd pain and right sided cp x 3 days with coughing.  C/o mild sob.  Seen PCP x 2 days ago and given cough syrup.  Pt states is unable to get filled due to cost.  Denies pain at this time.  States pain woke him up this morning.

## 2012-08-05 NOTE — ED Provider Notes (Signed)
History   This chart was scribed for Jeffrey Lennert, Jeffrey [name], by Jeffrey [name], ER scribe. The patient was seen in room [facility]11[facility] and the patient's care was started at 0859.    CSN: [id number]  Arrival date & time 08/05/12  4696   First Jeffrey [name] Initiated Contact with Patient 08/05/12 [phone]      Chief Complaint  Patient presents with  . Cough    (Consider location/radiation/quality/duration/timing/severity/associated sxs/prior treatment) Patient is a 79 y.o. male presenting with chest pain. The history is provided by the patient.  Chest Pain The chest pain began 6 - 12 hours ago. Chest pain occurs constantly. The chest pain is improving. The pain is currently at 0/10. The severity of the pain is moderate. The pain does not radiate. Primary symptoms include shortness of breath, cough and dizziness. Pertinent negatives for primary symptoms include no fever, no fatigue, no abdominal pain and no vomiting.  Dizziness also occurs with weakness. Dizziness does not occur with vomiting.  Associated symptoms include weakness.  Pertinent negatives for past medical history include no seizures.     Jeffrey [name] is a 79 y.o. male who presents to the Emergency Department complaining of moderate, sudden onset, gradually improving right-sided chest pain with associated coughing, mild SOB, dizziness, and weakness that awoke him from sleep this morning, but began 3 days ago. In ED, he reports the pain is 0/10. He reports a fever 3 days ago, but states that the issue has since resolved. He denies any associated emesis or diarrhea that is worse than baseline.  PCP is Dr. Sherwood Gambler.  Past Medical History  Diagnosis Date  . Diabetes mellitus, type II   . Hyperlipidemia     Lipid profile in 02/2012:135, 227, 41, 49  . Arteriosclerotic cardiovascular disease (ASCVD)     a. CABG x 4 in 1989 (VG->OM1->OM2, VG->RCA, LIMA->LAD), b. 05/2010: DES to VG-OM1/OM2, DES to distal LCx. c. NSTEMI in 04/2011 - TO distal LCX stent  and VG->OM2. d. 02/2012 NSTEMI DES to VG-OM1/continuation to OM2 occluded. e. inferior STEMI s/p DES to [facility] 06/2012.  Marland Kitchen Hypertension   . Osteoarthritis   . CVA (cerebral infarction)     details unclear. pt reports light stroke last year involving L leg. No imaging to suggest hemorrhagic etiology  . Pneumonia   . Cervical vertebral fracture   . Chronic back pain   . Benign prostatic hypertrophy     Required urinary catheter x4 weeks for retention  . Peptic ulcer disease   . Gastroesophageal reflux disease   . Ischemic cardiomyopathy     EF 40% 06/2012 (previously 50-55% 03/2012)  . DM (diabetes mellitus)     Past Surgical History  Procedure Date  . Coronary angioplasty with stent placement     2011, 02/2012  . Tonsillectomy   . Coronary artery bypass graft     Family History  Problem Relation Age of Onset  . Early death      Parents died young, unclear reasons    History  Substance Use Topics  . Smoking status: Former Smoker -- 2.0 packs/day for 10 years    Types: Cigarettes    Quit date: 07/14/1961  . Smokeless tobacco: Current User    Types: Chew  . Alcohol Use: No      Review of Systems  Constitutional: Negative for fever and fatigue.  HENT: Negative for congestion, sinus pressure and ear discharge.   Eyes: Negative for discharge.  Respiratory: Positive for cough and shortness of breath.  Cardiovascular: Positive for chest pain.  Gastrointestinal: Negative for vomiting, abdominal pain and diarrhea.  Genitourinary: Negative for frequency and hematuria.  Musculoskeletal: Negative for back pain.  Skin: Negative for rash.  Neurological: Positive for dizziness and weakness. Negative for seizures and headaches.  Hematological: Negative.   Psychiatric/Behavioral: Negative for hallucinations.  All other systems reviewed and are negative.    Allergies  Review of patient's allergies indicates no known allergies.  Home Medications   Current Outpatient Rx    Name  Route  Sig  Dispense  Refill  . ALBUTEROL SULFATE HFA 108 (90 BASE) MCG/ACT IN AERS   Inhalation   Inhale 2 puffs into the lungs every 6 (six) hours as needed. For shortness of breath         . ASPIRIN 81 MG PO CHEW   Oral   Chew 1 tablet (81 mg total) by mouth daily.         . ATORVASTATIN CALCIUM 80 MG PO TABS   Oral   Take 1 tablet (80 mg total) by mouth daily at 6 PM.   30 tablet   3   . B-COMPLEX PO   Oral   Take 1 tablet by mouth daily.          Marland Kitchen CALCIUM CARBONATE ANTACID 500 MG PO CHEW   Oral   Chew 1 tablet by mouth 4 (four) times daily - after meals and at bedtime.         Marland Kitchen CARVEDILOL 3.125 MG PO TABS   Oral   Take 1 tablet (3.125 mg total) by mouth 2 (two) times daily with a meal.   60 tablet   6   . DOCUSATE SODIUM 100 MG PO TABS   Oral   Take 100 mg by mouth at bedtime. Constipation         . OMEGA-3 FATTY ACIDS 1000 MG PO CAPS   Oral   Take 1 g by mouth daily.         . ISOSORBIDE MONONITRATE ER 30 MG PO TB24   Oral   Take 0.5 tablets (15 mg total) by mouth daily.   30 tablet   0   . METFORMIN HCL 500 MG PO TABS   Oral   Take 500 mg by mouth 2 (two) times daily.         . ADULT MULTIVITAMIN W/MINERALS CH   Oral   Take 1 tablet by mouth daily.         Marland Kitchen NITROFURANTOIN MACROCRYSTAL 100 MG PO CAPS   Oral   Take 100 mg by mouth at bedtime.         Marland Kitchen NITROGLYCERIN 0.4 MG SL SUBL   Sublingual   Place 1 tablet (0.4 mg total) under the tongue every 5 (five) minutes x 3 doses as needed for chest pain.   25 tablet   3   . PANTOPRAZOLE SODIUM 40 MG PO TBEC   Oral   Take 40 mg by mouth every morning.         Marland Kitchen POTASSIUM CHLORIDE CRYS ER 10 MEQ PO TBCR   Oral   Take 1 tablet (10 mEq total) by mouth daily.   30 tablet   3   . RAMIPRIL 2.5 MG PO CAPS   Oral   Take 1 capsule (2.5 mg total) by mouth daily.   30 capsule   0   . SAXAGLIPTIN HCL 5 MG PO TABS   Oral   Take 5 mg by  mouth daily at 12 noon.           Marland Kitchen TAMSULOSIN HCL 0.4 MG PO CAPS   Oral   Take 0.4 mg by mouth at bedtime. For urinary health         . TICAGRELOR 90 MG PO TABS   Oral   Take 1 tablet (90 mg total) by mouth 2 (two) times daily.   60 tablet   6     BP 144/68  Pulse 70  Temp 97.9 F (36.6 C) (Oral)  Resp 20  Ht 5\' 10"  (1.778 m)  Wt 180 lb (81.647 kg)  BMI 25.83 kg/m2  SpO2 98%  Physical Exam  Nursing note and vitals reviewed. Constitutional: He is oriented to person, place, and time. He appears well-developed.  HENT:  Head: Normocephalic and atraumatic.  Eyes: Conjunctivae normal and EOM are normal. No scleral icterus.  Neck: Neck supple. No thyromegaly present.  Cardiovascular: Normal rate and regular rhythm.  Exam reveals no gallop and no friction rub.   No murmur heard. Pulmonary/Chest: No stridor. He has no wheezes. He has no rales. He exhibits no tenderness.  Abdominal: He exhibits no distension. There is no tenderness. There is no rebound.  Musculoskeletal: Normal range of motion. He exhibits no edema.  Lymphadenopathy:    He has no cervical adenopathy.  Neurological: He is oriented to person, place, and time. Coordination normal.  Skin: No rash noted. No erythema.  Psychiatric: He has a normal mood and affect. His behavior is normal.    ED Course  Procedures (including critical care time)  DIAGNOSTIC STUDIES: Oxygen Saturation is 98% on room air, normal by my interpretation.    COORDINATION OF CARE:  09:10- Discussed planned course of treatment with the patient, including a chest X-ray, who is agreeable at this time.  09:16- Medication Orders- sodium chloride 0.9% bolus 500 mL- once.  Results for orders placed during the hospital encounter of 08/05/12  CBC WITH DIFFERENTIAL      Component Value Range   WBC 4.8  4.0 - 10.5 K/uL   RBC 3.70 (*) 4.22 - 5.81 MIL/uL   Hemoglobin 11.0 (*) 13.0 - 17.0 g/dL   HCT 04.5 (*) 40.9 - 81.1 %   MCV 90.0  78.0 - 100.0 fL   MCH 29.7  26.0 - 34.0  pg   MCHC 33.0  30.0 - 36.0 g/dL   RDW 91.4  78.2 - 95.6 %   Platelets 135 (*) 150 - 400 K/uL   Neutrophils Relative 72  43 - 77 %   Neutro Abs 3.5  1.7 - 7.7 K/uL   Lymphocytes Relative 15  12 - 46 %   Lymphs Abs 0.7  0.7 - 4.0 K/uL   Monocytes Relative 9  3 - 12 %   Monocytes Absolute 0.4  0.1 - 1.0 K/uL   Eosinophils Relative 4  0 - 5 %   Eosinophils Absolute 0.2  0.0 - 0.7 K/uL   Basophils Relative 0  0 - 1 %   Basophils Absolute 0.0  0.0 - 0.1 K/uL  COMPREHENSIVE METABOLIC PANEL      Component Value Range   Sodium 134 (*) 135 - 145 mEq/L   Potassium 4.2  3.5 - 5.1 mEq/L   Chloride 98  96 - 112 mEq/L   CO2 25  19 - 32 mEq/L   Glucose, Bld 188 (*) 70 - 99 mg/dL   BUN 15  6 - 23 mg/dL   Creatinine, Ser 2.13  0.50 - 1.35 mg/dL   Calcium 9.9  8.4 - 16.1 mg/dL   Total Protein 6.8  6.0 - 8.3 g/dL   Albumin 3.5  3.5 - 5.2 g/dL   AST 25  0 - 37 U/L   ALT 25  0 - 53 U/L   Alkaline Phosphatase 71  39 - 117 U/L   Total Bilirubin 0.2 (*) 0.3 - 1.2 mg/dL   GFR calc non Af Amer 78 (*) >90 mL/min   GFR calc Af Amer >90  >90 mL/min  TROPONIN I      Component Value Range   Troponin I <0.30  <0.30 ng/mL  URINALYSIS, ROUTINE W REFLEX MICROSCOPIC      Component Value Range   Color, Urine YELLOW  YELLOW   APPearance CLEAR  CLEAR   Specific Gravity, Urine 1.020  1.005 - 1.030   pH 6.0  5.0 - 8.0   Glucose, UA NEGATIVE  NEGATIVE mg/dL   Hgb urine dipstick NEGATIVE  NEGATIVE   Bilirubin Urine NEGATIVE  NEGATIVE   Ketones, ur TRACE (*) NEGATIVE mg/dL   Protein, ur NEGATIVE  NEGATIVE mg/dL   Urobilinogen, UA 0.2  0.0 - 1.0 mg/dL   Nitrite NEGATIVE  NEGATIVE   Leukocytes, UA NEGATIVE  NEGATIVE    Dg Abd Acute W/chest  08/05/2012  *RADIOLOGY REPORT*  Clinical Data: Cough, shortness of breath and weakness.  Abdominal tenderness.  ACUTE ABDOMEN SERIES (ABDOMEN 2 VIEW & CHEST 1 VIEW)  Comparison: Chest x-ray 07/29/2012.  Acute abdominal series 04/02/2011.  Findings: Lung volumes are normal.   No consolidative airspace disease.  No pleural effusions.  No pneumothorax.  No pulmonary nodule or mass noted.  Pulmonary vasculature and the cardiomediastinal silhouette are within normal limits. Atherosclerosis in the thoracic aorta.  Status post median sternotomy for CABG with a LIMA.  Gas and stool are seen scattered throughout the colon extending to the level of the distal rectum.  No pathologic distension of small bowel is noted.  No gross evidence of pneumoperitoneum. Surgical clips in the upper abdomen.  IMPRESSION: 1.  Nonobstructive bowel gas pattern. 2.  No pneumoperitoneum. 3.  No radiographic evidence of acute cardiopulmonary disease. 4.  Atherosclerosis. 5.  Postoperative changes, as above.   Original Report Authenticated By: Trudie Reed, M.D.       Labs Reviewed - No data to display No results found.   No diagnosis found. MDM   Date: 08/05/2012  Rate:   Rhythm: normal sinus rhythm  QRS Axis: normal  Intervals: normal  ST/T Wave abnormalities: normal  Conduction Disutrbances:none  Narrative Interpretation:   Old EKG Reviewed: none available   The chart was scribed for me under my direct supervision.  I personally performed the history, physical, and medical decision making and all procedures in the evaluation of this patient.Jeffrey Lennert, Jeffrey [name] 08/05/12 [phone]

## 2012-08-05 NOTE — ED Notes (Signed)
Spoke with [facility] about transfer home.  Awaiting return call.

## 2012-08-05 NOTE — ED Notes (Signed)
[facility] to transfer pt home.

## 2012-08-09 ENCOUNTER — Emergency Department (HOSPITAL_COMMUNITY)
Admission: EM | Admit: 2012-08-09 | Discharge: 2012-08-09 | Disposition: A | Discharge status: Home / Self Care | Payer: Medicare Other | Attending: Emergency Medicine | Admitting: Emergency Medicine

## 2012-08-09 ENCOUNTER — Other Ambulatory Visit: Payer: Self-pay

## 2012-08-09 ENCOUNTER — Encounter (HOSPITAL_COMMUNITY): Payer: Self-pay | Admitting: *Deleted

## 2012-08-09 DIAGNOSIS — Z8711 Personal history of peptic ulcer disease: Secondary | ICD-10-CM | POA: Insufficient documentation

## 2012-08-09 DIAGNOSIS — R197 Diarrhea, unspecified: Secondary | ICD-10-CM | POA: Insufficient documentation

## 2012-08-09 DIAGNOSIS — R630 Anorexia: Secondary | ICD-10-CM

## 2012-08-09 DIAGNOSIS — R634 Abnormal weight loss: Secondary | ICD-10-CM

## 2012-08-09 DIAGNOSIS — Z7982 Long term (current) use of aspirin: Secondary | ICD-10-CM | POA: Insufficient documentation

## 2012-08-09 DIAGNOSIS — Z8781 Personal history of (healed) traumatic fracture: Secondary | ICD-10-CM | POA: Insufficient documentation

## 2012-08-09 DIAGNOSIS — G8929 Other chronic pain: Secondary | ICD-10-CM | POA: Insufficient documentation

## 2012-08-09 DIAGNOSIS — Z7902 Long term (current) use of antithrombotics/antiplatelets: Secondary | ICD-10-CM | POA: Insufficient documentation

## 2012-08-09 DIAGNOSIS — M549 Dorsalgia, unspecified: Secondary | ICD-10-CM | POA: Insufficient documentation

## 2012-08-09 DIAGNOSIS — Z87891 Personal history of nicotine dependence: Secondary | ICD-10-CM | POA: Insufficient documentation

## 2012-08-09 DIAGNOSIS — R0602 Shortness of breath: Secondary | ICD-10-CM | POA: Insufficient documentation

## 2012-08-09 DIAGNOSIS — K5900 Constipation, unspecified: Secondary | ICD-10-CM | POA: Insufficient documentation

## 2012-08-09 DIAGNOSIS — I10 Essential (primary) hypertension: Secondary | ICD-10-CM | POA: Insufficient documentation

## 2012-08-09 DIAGNOSIS — N400 Enlarged prostate without lower urinary tract symptoms: Secondary | ICD-10-CM | POA: Insufficient documentation

## 2012-08-09 DIAGNOSIS — Z8739 Personal history of other diseases of the musculoskeletal system and connective tissue: Secondary | ICD-10-CM | POA: Insufficient documentation

## 2012-08-09 DIAGNOSIS — E785 Hyperlipidemia, unspecified: Secondary | ICD-10-CM | POA: Insufficient documentation

## 2012-08-09 DIAGNOSIS — Z951 Presence of aortocoronary bypass graft: Secondary | ICD-10-CM | POA: Insufficient documentation

## 2012-08-09 DIAGNOSIS — Z8673 Personal history of transient ischemic attack (TIA), and cerebral infarction without residual deficits: Secondary | ICD-10-CM | POA: Insufficient documentation

## 2012-08-09 DIAGNOSIS — Z8679 Personal history of other diseases of the circulatory system: Secondary | ICD-10-CM | POA: Insufficient documentation

## 2012-08-09 DIAGNOSIS — K219 Gastro-esophageal reflux disease without esophagitis: Secondary | ICD-10-CM | POA: Insufficient documentation

## 2012-08-09 DIAGNOSIS — R079 Chest pain, unspecified: Secondary | ICD-10-CM | POA: Insufficient documentation

## 2012-08-09 DIAGNOSIS — Z79899 Other long term (current) drug therapy: Secondary | ICD-10-CM | POA: Insufficient documentation

## 2012-08-09 DIAGNOSIS — R42 Dizziness and giddiness: Secondary | ICD-10-CM | POA: Insufficient documentation

## 2012-08-09 DIAGNOSIS — Z8701 Personal history of pneumonia (recurrent): Secondary | ICD-10-CM | POA: Insufficient documentation

## 2012-08-09 DIAGNOSIS — R5381 Other malaise: Secondary | ICD-10-CM | POA: Insufficient documentation

## 2012-08-09 DIAGNOSIS — E119 Type 2 diabetes mellitus without complications: Secondary | ICD-10-CM | POA: Insufficient documentation

## 2012-08-09 LAB — URINALYSIS, ROUTINE W REFLEX MICROSCOPIC
Glucose, UA: 500 mg/dL — AB
Leukocytes, UA: NEGATIVE
Nitrite: NEGATIVE
pH: 6 (ref 5.0–8.0)

## 2012-08-09 LAB — CBC WITH DIFFERENTIAL/PLATELET
Basophils Relative: 0 % (ref 0–1)
Eosinophils Absolute: 0.1 10*3/uL (ref 0.0–0.7)
HCT: 35.5 % — ABNORMAL LOW (ref 39.0–52.0)
Hemoglobin: 11.8 g/dL — ABNORMAL LOW (ref 13.0–17.0)
MCH: 29.6 pg (ref 26.0–34.0)
MCHC: 33.2 g/dL (ref 30.0–36.0)
MCV: 89 fL (ref 78.0–100.0)
Monocytes Absolute: 0.5 10*3/uL (ref 0.1–1.0)
Monocytes Relative: 7 % (ref 3–12)

## 2012-08-09 LAB — COMPREHENSIVE METABOLIC PANEL
Albumin: 3.5 g/dL (ref 3.5–5.2)
BUN: 15 mg/dL (ref 6–23)
Creatinine, Ser: 0.95 mg/dL (ref 0.50–1.35)
GFR calc Af Amer: 88 mL/min — ABNORMAL LOW (ref >90)
Glucose, Bld: 248 mg/dL — ABNORMAL HIGH (ref 70–99)
Total Bilirubin: 0.1 mg/dL — ABNORMAL LOW (ref 0.3–1.2)
Total Protein: 6.8 g/dL (ref 6.0–8.3)

## 2012-08-09 LAB — TROPONIN I: Troponin I: <0.3 ng/mL (ref <0.30)

## 2012-08-09 NOTE — Discharge Instructions (Signed)
Try to eat and drink more. Have [facility] recheck you next week if your appetite doesn't improve.

## 2012-08-09 NOTE — ED Notes (Signed)
Patient stated he had some dizziness and weakness upon standing, but was able to stand for vital signs.

## 2012-08-09 NOTE — ED Provider Notes (Signed)
History   This chart was scribed for [name], MD by Gerlean Ren, ED Scribe. This patient was seen in room APA02/APA02 and the patient's care was started at 11:20 AM    CSN: [id number]  Arrival date & time 08/09/12  1042   First MD Initiated Contact with Patient 08/09/12 1103      Chief Complaint  Patient presents with  . Shortness of Breath  . Weight Loss     The history is provided by the patient. No language interpreter was used.   Jeffrey [name] is a 79 y.o. male with h/o DM-II, ASCVD, HTN, CVA who presents to the Emergency Department complaining of dyspnea beginning this morning that was worse than baseline dyspnea and that was not improved by inhaler, which normally improves any worsening dyspnea.  He reports dyspnea has improved since coming to ED.  Pt states he also had and burning chest pain that began at 7:00 AM this morning that was resolved by 11:00 AM after taking 5 Tums and is not currently present.  Pt reports he was at [facility] last week for abdominal pain, no longer on medicine he was prescribed.  Pt reports constipation 2 days ago and 3-4 episodes of diarrhea yesterday.  Pt reports he has lost 6 lbs in the past 2 days with no known cause.  Pt is a former smoker and denies alcohol use.    Patient states he had a cough and sore throat last week. He states he's had loss of appetite. He states he was given an extensive cough medicine and his cough is now almost gone.  PCP [name]  Cardiologist is Dr. Dietrich Pates.   Past Medical History  Diagnosis Date  . Diabetes mellitus, type II   . Hyperlipidemia     Lipid profile in 02/2012:135, 227, 41, 49  . Arteriosclerotic cardiovascular disease (ASCVD)     a. CABG x 4 in 1989 (VG->OM1->OM2, VG->RCA, LIMA->LAD), b. 05/2010: DES to VG-OM1/OM2, DES to distal LCx. c. NSTEMI in 04/2011 - TO distal LCX stent and VG->OM2. d. 02/2012 NSTEMI DES to VG-OM1/continuation to OM2 occluded. e. inferior STEMI s/p DES to [facility] 06/2012.  Marland Kitchen  Hypertension   . Osteoarthritis   . CVA (cerebral infarction)     details unclear. pt reports light stroke last year involving L leg. No imaging to suggest hemorrhagic etiology  . Pneumonia   . Cervical vertebral fracture   . Chronic back pain   . Benign prostatic hypertrophy     Required urinary catheter x4 weeks for retention  . Peptic ulcer disease   . Gastroesophageal reflux disease   . Ischemic cardiomyopathy     EF 40% 06/2012 (previously 50-55% 03/2012)  . DM (diabetes mellitus)     Past Surgical History  Procedure Date  . Coronary angioplasty with stent placement     2011, 02/2012  . Tonsillectomy   . Coronary artery bypass graft     Family History  Problem Relation Age of Onset  . Early death      Parents died young, unclear reasons    History  Substance Use Topics  . Smoking status: Former Smoker -- 2.0 packs/day for 10 years    Types: Cigarettes    Quit date: 07/14/1961  . Smokeless tobacco: Current User    Types: Chew  . Alcohol Use: No   Lives at home Lives alone  Review of Systems  Constitutional: Positive for unexpected weight change.  Respiratory: Positive for shortness of breath.  Cardiovascular: Negative for chest pain.  Gastrointestinal: Negative for diarrhea and constipation.  All other systems reviewed and are negative.    Allergies  Review of patient's allergies indicates no known allergies.  Home Medications   Current Outpatient Rx  Name  Route  Sig  Dispense  Refill  . ALBUTEROL SULFATE HFA 108 (90 BASE) MCG/ACT IN AERS   Inhalation   Inhale 2 puffs into the lungs every 6 (six) hours as needed. For shortness of breath         . ASPIRIN 81 MG PO CHEW   Oral   Chew 1 tablet (81 mg total) by mouth daily.         . ATORVASTATIN CALCIUM 80 MG PO TABS   Oral   Take 1 tablet (80 mg total) by mouth daily at 6 PM.   30 tablet   3   . B-COMPLEX PO   Oral   Take 1 tablet by mouth daily.          Marland Kitchen CALCIUM CARBONATE ANTACID  500 MG PO CHEW   Oral   Chew 1 tablet by mouth 4 (four) times daily - after meals and at bedtime.         Marland Kitchen CARVEDILOL 3.125 MG PO TABS   Oral   Take 1 tablet (3.125 mg total) by mouth 2 (two) times daily with a meal.   60 tablet   6   . DOCUSATE SODIUM 100 MG PO TABS   Oral   Take 100 mg by mouth at bedtime. Constipation         . OMEGA-3 FATTY ACIDS 1000 MG PO CAPS   Oral   Take 1 g by mouth daily.         . ISOSORBIDE MONONITRATE ER 30 MG PO TB24   Oral   Take 0.5 tablets (15 mg total) by mouth daily.   30 tablet   0   . METFORMIN HCL 500 MG PO TABS   Oral   Take 500 mg by mouth 2 (two) times daily.         . ADULT MULTIVITAMIN W/MINERALS CH   Oral   Take 1 tablet by mouth daily.         Marland Kitchen NITROFURANTOIN MACROCRYSTAL 100 MG PO CAPS   Oral   Take 100 mg by mouth at bedtime.         Marland Kitchen NITROGLYCERIN 0.4 MG SL SUBL   Sublingual   Place 1 tablet (0.4 mg total) under the tongue every 5 (five) minutes x 3 doses as needed for chest pain.   25 tablet   3   . PANTOPRAZOLE SODIUM 40 MG PO TBEC   Oral   Take 40 mg by mouth every morning.         Marland Kitchen POTASSIUM CHLORIDE CRYS ER 10 MEQ PO TBCR   Oral   Take 1 tablet (10 mEq total) by mouth daily.   30 tablet   3   . RAMIPRIL 2.5 MG PO CAPS   Oral   Take 1 capsule (2.5 mg total) by mouth daily.   30 capsule   0   . SAXAGLIPTIN HCL 5 MG PO TABS   Oral   Take 5 mg by mouth daily at 12 noon.          Marland Kitchen TAMSULOSIN HCL 0.4 MG PO CAPS   Oral   Take 0.4 mg by mouth at bedtime. For urinary health         .  TICAGRELOR 90 MG PO TABS   Oral   Take 1 tablet (90 mg total) by mouth 2 (two) times daily.   60 tablet   6     BP 130/63  Pulse 101  Temp 97.5 F (36.4 C) (Oral)  Resp 20  Ht 5\' 10"  (1.778 m)  Wt 172 lb (78.019 kg)  BMI 24.68 kg/m2  SpO2 98%  Vital signs normal except borderline tachycardia   Physical Exam  Nursing note and vitals reviewed. Constitutional: He is oriented to  person, place, and time. He appears well-developed and well-nourished.  Non-toxic appearance. He does not appear ill. No distress.  HENT:  Head: Normocephalic and atraumatic.  Right Ear: External ear normal.  Left Ear: External ear normal.  Nose: Nose normal. No mucosal edema or rhinorrhea.  Mouth/Throat: Oropharynx is clear and moist and mucous membranes are normal. No dental abscesses or uvula swelling.  Eyes: Conjunctivae normal and EOM are normal. Pupils are equal, round, and reactive to light.  Neck: Normal range of motion and full passive range of motion without pain. Neck supple.  Cardiovascular: Normal rate, regular rhythm and normal heart sounds.  Exam reveals no gallop and no friction rub.   No murmur heard. Pulmonary/Chest: Effort normal. No respiratory distress. He has no wheezes. He has no rhonchi. He has no rales. He exhibits no tenderness and no crepitus.       Rare rhonchi.  Abdominal: Soft. Normal appearance and bowel sounds are normal. He exhibits no distension. There is no tenderness. There is no rebound and no guarding.  Musculoskeletal: Normal range of motion. He exhibits no edema and no tenderness.       Moves all extremities well.   Neurological: He is alert and oriented to person, place, and time. He has normal strength. No cranial nerve deficit.  Skin: Skin is warm, dry and intact. No rash noted. No erythema. No pallor.  Psychiatric: He has a normal mood and affect. His speech is normal and behavior is normal. His mood appears not anxious.    ED Course  Procedures (including critical care time) DIAGNOSTIC STUDIES: Oxygen Saturation is 98% on room air, normal by my interpretation.    COORDINATION OF CARE: 11:28 AM- Patient informed of clinical course, understands medical decision-making process, and agrees with plan.  Ordered CBC, c-met, urinalysis, troponin I.  2:08 PM- Re-check, pt states he feels better.  Pt weighed this visit and his weight was actually 176  pounds. On the last prior to ED visits his weight was listed as 180, however it's not clear if that was what the patient stated or if he was actually weighed. Patient ate snacks and drink fluids without problems during his ED visit.  Patient is a frequent E. visitor. He has had 12 ED visits in the past 6 months. He has had a chest x-ray done on December 11, January 2, January 16, and January 23 which do not show acute abnormality. This is the patient's fifth ED visit in January   Results for orders placed during the hospital encounter of 08/09/12  GLUCOSE, CAPILLARY      Component Value Range   Glucose-Capillary 247 (*) 70 - 99 mg/dL  CBC WITH DIFFERENTIAL      Component Value Range   WBC 6.9  4.0 - 10.5 K/uL   RBC 3.99 (*) 4.22 - 5.81 MIL/uL   Hemoglobin 11.8 (*) 13.0 - 17.0 g/dL   HCT 16.1 (*) 09.6 - 04.5 %   MCV 89.0  78.0 - 100.0 fL   MCH 29.6  26.0 - 34.0 pg   MCHC 33.2  30.0 - 36.0 g/dL   RDW 16.1  09.6 - 04.5 %   Platelets 158  150 - 400 K/uL   Neutrophils Relative 76  43 - 77 %   Neutro Abs 5.3  1.7 - 7.7 K/uL   Lymphocytes Relative 16  12 - 46 %   Lymphs Abs 1.1  0.7 - 4.0 K/uL   Monocytes Relative 7  3 - 12 %   Monocytes Absolute 0.5  0.1 - 1.0 K/uL   Eosinophils Relative 1  0 - 5 %   Eosinophils Absolute 0.1  0.0 - 0.7 K/uL   Basophils Relative 0  0 - 1 %   Basophils Absolute 0.0  0.0 - 0.1 K/uL  COMPREHENSIVE METABOLIC PANEL      Component Value Range   Sodium 135  135 - 145 mEq/L   Potassium 4.3  3.5 - 5.1 mEq/L   Chloride 99  96 - 112 mEq/L   CO2 23  19 - 32 mEq/L   Glucose, Bld 248 (*) 70 - 99 mg/dL   BUN 15  6 - 23 mg/dL   Creatinine, Ser 4.09  0.50 - 1.35 mg/dL   Calcium 9.4  8.4 - 81.1 mg/dL   Total Protein 6.8  6.0 - 8.3 g/dL   Albumin 3.5  3.5 - 5.2 g/dL   AST 24  0 - 37 U/L   ALT 24  0 - 53 U/L   Alkaline Phosphatase 77  39 - 117 U/L   Total Bilirubin 0.1 (*) 0.3 - 1.2 mg/dL   GFR calc non Af Amer 76 (*) >90 mL/min   GFR calc Af Amer 88 (*) >90  mL/min  URINALYSIS, ROUTINE W REFLEX MICROSCOPIC      Component Value Range   Color, Urine YELLOW  YELLOW   APPearance CLEAR  CLEAR   Specific Gravity, Urine 1.020  1.005 - 1.030   pH 6.0  5.0 - 8.0   Glucose, UA 500 (*) NEGATIVE mg/dL   Hgb urine dipstick NEGATIVE  NEGATIVE   Bilirubin Urine NEGATIVE  NEGATIVE   Ketones, ur TRACE (*) NEGATIVE mg/dL   Protein, ur TRACE (*) NEGATIVE mg/dL   Urobilinogen, UA 0.2  0.0 - 1.0 mg/dL   Nitrite NEGATIVE  NEGATIVE   Leukocytes, UA NEGATIVE  NEGATIVE  TROPONIN I      Component Value Range   Troponin I <0.30  <0.30 ng/mL  URINE MICROSCOPIC-ADD ON      Component Value Range   RBC / HPF 0-2  <3 RBC/hpf   Bacteria, UA RARE  RARE    Laboratory interpretation all normal except hypoglycemia, mild stable anemia   Date: 08/09/2012  Rate: 98  Rhythm: normal sinus rhythm  QRS Axis: normal  Intervals: QT prolonged  ST/T Wave abnormalities: nonspecific T wave changes  Conduction Disutrbances:none  Narrative Interpretation:   Old EKG Reviewed: unchanged from 08/05/2012    1. Loss of appetite   2. Weight loss    Plan discharge  Devoria Albe, MD, FACEP    MDM    I personally performed the services described in this documentation, which was scribed in my presence. The recorded information has been reviewed and considered.  Devoria Albe, MD, Armando Gang       [name], MD 08/09/12 [phone]

## 2012-08-09 NOTE — ED Notes (Signed)
C/o increased sob, weakness and 6 lb weight loss in past 2 days.

## 2012-08-09 NOTE — ED Notes (Signed)
Pt ambulatory to scale. No problems. Meal tray ordered. nad

## 2012-08-17 ENCOUNTER — Emergency Department (HOSPITAL_COMMUNITY)
Admission: EM | Admit: 2012-08-17 | Discharge: 2012-08-17 | Disposition: A | Discharge status: Home / Self Care | Payer: Medicare Other | Attending: Emergency Medicine | Admitting: Emergency Medicine

## 2012-08-17 ENCOUNTER — Emergency Department (HOSPITAL_COMMUNITY): Payer: Medicare Other

## 2012-08-17 ENCOUNTER — Encounter (HOSPITAL_COMMUNITY): Payer: Self-pay | Admitting: Emergency Medicine

## 2012-08-17 DIAGNOSIS — Z8673 Personal history of transient ischemic attack (TIA), and cerebral infarction without residual deficits: Secondary | ICD-10-CM | POA: Insufficient documentation

## 2012-08-17 DIAGNOSIS — Z951 Presence of aortocoronary bypass graft: Secondary | ICD-10-CM | POA: Insufficient documentation

## 2012-08-17 DIAGNOSIS — Z87891 Personal history of nicotine dependence: Secondary | ICD-10-CM | POA: Insufficient documentation

## 2012-08-17 DIAGNOSIS — R079 Chest pain, unspecified: Secondary | ICD-10-CM | POA: Insufficient documentation

## 2012-08-17 DIAGNOSIS — E785 Hyperlipidemia, unspecified: Secondary | ICD-10-CM | POA: Insufficient documentation

## 2012-08-17 DIAGNOSIS — Z8711 Personal history of peptic ulcer disease: Secondary | ICD-10-CM | POA: Insufficient documentation

## 2012-08-17 DIAGNOSIS — Z8701 Personal history of pneumonia (recurrent): Secondary | ICD-10-CM | POA: Insufficient documentation

## 2012-08-17 DIAGNOSIS — Z8739 Personal history of other diseases of the musculoskeletal system and connective tissue: Secondary | ICD-10-CM | POA: Insufficient documentation

## 2012-08-17 DIAGNOSIS — Z8679 Personal history of other diseases of the circulatory system: Secondary | ICD-10-CM | POA: Insufficient documentation

## 2012-08-17 DIAGNOSIS — Z7982 Long term (current) use of aspirin: Secondary | ICD-10-CM | POA: Insufficient documentation

## 2012-08-17 DIAGNOSIS — I10 Essential (primary) hypertension: Secondary | ICD-10-CM | POA: Insufficient documentation

## 2012-08-17 DIAGNOSIS — Z8781 Personal history of (healed) traumatic fracture: Secondary | ICD-10-CM | POA: Insufficient documentation

## 2012-08-17 DIAGNOSIS — K219 Gastro-esophageal reflux disease without esophagitis: Secondary | ICD-10-CM | POA: Insufficient documentation

## 2012-08-17 DIAGNOSIS — Z87448 Personal history of other diseases of urinary system: Secondary | ICD-10-CM | POA: Insufficient documentation

## 2012-08-17 DIAGNOSIS — Z9861 Coronary angioplasty status: Secondary | ICD-10-CM | POA: Insufficient documentation

## 2012-08-17 DIAGNOSIS — E119 Type 2 diabetes mellitus without complications: Secondary | ICD-10-CM | POA: Insufficient documentation

## 2012-08-17 DIAGNOSIS — Z79899 Other long term (current) drug therapy: Secondary | ICD-10-CM | POA: Insufficient documentation

## 2012-08-17 LAB — CBC WITH DIFFERENTIAL/PLATELET
Basophils Absolute: 0 10*3/uL (ref 0.0–0.1)
Basophils Relative: 0 % (ref 0–1)
Eosinophils Absolute: 0.1 10*3/uL (ref 0.0–0.7)
Eosinophils Relative: 2 % (ref 0–5)
HCT: 38.4 % — ABNORMAL LOW (ref 39.0–52.0)
Lymphocytes Relative: 17 % (ref 12–46)
MCHC: 32.8 g/dL (ref 30.0–36.0)
MCV: 89.3 fL (ref 78.0–100.0)
Monocytes Absolute: 0.7 10*3/uL (ref 0.1–1.0)
Platelets: 231 10*3/uL (ref 150–400)
RDW: 14 % (ref 11.5–15.5)
WBC: 7.3 10*3/uL (ref 4.0–10.5)

## 2012-08-17 LAB — TROPONIN I: Troponin I: <0.3 ng/mL (ref <0.30)

## 2012-08-17 LAB — BASIC METABOLIC PANEL
CO2: 26 mEq/L (ref 19–32)
Calcium: 9.5 mg/dL (ref 8.4–10.5)
Creatinine, Ser: 0.81 mg/dL (ref 0.50–1.35)
GFR calc Af Amer: >90 mL/min (ref >90)
GFR calc non Af Amer: 81 mL/min — ABNORMAL LOW (ref >90)
Sodium: 134 mEq/L — ABNORMAL LOW (ref 135–145)

## 2012-08-17 LAB — GLUCOSE, CAPILLARY: Glucose-Capillary: 177 mg/dL — ABNORMAL HIGH (ref 70–99)

## 2012-08-17 NOTE — ED Notes (Signed)
Denies pain; a&ox4; instructions and f/u information given/reviewed - verbalizes understanding.

## 2012-08-17 NOTE — ED Notes (Signed)
Patient c/o non-radiating central chest pain that started this morning. Cardiac history with MI. Patient unsure of shortness of breath but appears to have labored breathing. Patient reports taking 325mg  of aspirin. Per patient took "heart pain pill" but denies it being nitro. Patient also reports taking Pepto Bismol with some relief.

## 2012-08-17 NOTE — ED Provider Notes (Signed)
History    This chart was scribed for Osvaldo Human, MD, MD by Smitty Pluck, ED Scribe. The patient was seen in room APA04/APA04 and the patient's care was started at 8:48 PM.   CSN: [id number]  Arrival date & time 08/17/12  0825      Chief Complaint  Patient presents with  . Chest Pain     The history is provided by the patient and medical records. No language interpreter was used.   Jeffrey [name] is a 79 y.o. male with hx of DM, CVA, GERD and ASCVD who presents to the Emergency Department complaining of intermittent, moderate central chest pain onset today. The pain did not radiate. Pt states the episodes last 45 minutes. He states that he took aspirin and isosorbide mononitrate with relief. He states he has hx of similar chest pain. Pt has had 3 stints placed. He states that he felt generalized weakness this AM. Pt denies fever, diaphoresis, numbness, chills, nausea, vomiting, diarrhea, dysuria, rash, syncope, cough, SOB and any other pain.   Past Medical History  Diagnosis Date  . Diabetes mellitus, type II   . Hyperlipidemia     Lipid profile in 02/2012:135, 227, 41, 49  . Arteriosclerotic cardiovascular disease (ASCVD)     a. CABG x 4 in 1989 (VG->OM1->OM2, VG->RCA, LIMA->LAD), b. 05/2010: DES to VG-OM1/OM2, DES to distal LCx. c. NSTEMI in 04/2011 - TO distal LCX stent and VG->OM2. d. 02/2012 NSTEMI DES to VG-OM1/continuation to OM2 occluded. e. inferior STEMI s/p DES to [facility] 06/2012.  Marland Kitchen Hypertension   . Osteoarthritis   . CVA (cerebral infarction)     details unclear. pt reports light stroke last year involving L leg. No imaging to suggest hemorrhagic etiology  . Pneumonia   . Cervical vertebral fracture   . Chronic back pain   . Benign prostatic hypertrophy     Required urinary catheter x4 weeks for retention  . Peptic ulcer disease   . Gastroesophageal reflux disease   . Ischemic cardiomyopathy     EF 40% 06/2012 (previously 50-55% 03/2012)  . DM (diabetes  mellitus)     Past Surgical History  Procedure Date  . Coronary angioplasty with stent placement     2011, 02/2012  . Tonsillectomy   . Coronary artery bypass graft     Family History  Problem Relation Age of Onset  . Early death      Parents died young, unclear reasons    History  Substance Use Topics  . Smoking status: Former Smoker -- 2.0 packs/day for 10 years    Types: Cigarettes    Quit date: 07/14/1961  . Smokeless tobacco: Current User    Types: Chew  . Alcohol Use: No      Review of Systems  Constitutional: Negative for fever, chills and diaphoresis.  Respiratory: Negative for cough and shortness of breath.   Cardiovascular: Positive for chest pain.  Gastrointestinal: Negative for nausea, vomiting and diarrhea.  Genitourinary: Negative for dysuria.  Skin: Negative for rash.  Neurological: Positive for weakness. Negative for syncope and numbness.  All other systems reviewed and are negative.    Allergies  Review of patient's allergies indicates no known allergies.  Home Medications   Current Outpatient Rx  Name  Route  Sig  Dispense  Refill  . ALBUTEROL SULFATE HFA 108 (90 BASE) MCG/ACT IN AERS   Inhalation   Inhale 2 puffs into the lungs every 6 (six) hours as needed. For shortness of breath         .  ASPIRIN 81 MG PO CHEW   Oral   Chew 1 tablet (81 mg total) by mouth daily.         . ATORVASTATIN CALCIUM 80 MG PO TABS   Oral   Take 1 tablet (80 mg total) by mouth daily at 6 PM.   30 tablet   3   . B-COMPLEX PO   Oral   Take 1 tablet by mouth daily.          Marland Kitchen CALCIUM CARBONATE ANTACID 500 MG PO CHEW   Oral   Chew 1 tablet by mouth 4 (four) times daily - after meals and at bedtime.         Marland Kitchen CARVEDILOL 3.125 MG PO TABS   Oral   Take 1 tablet (3.125 mg total) by mouth 2 (two) times daily with a meal.   60 tablet   6   . DOCUSATE SODIUM 100 MG PO TABS   Oral   Take 100 mg by mouth at bedtime. Constipation         .  OMEGA-3 FATTY ACIDS 1000 MG PO CAPS   Oral   Take 1 g by mouth daily.         . ISOSORBIDE MONONITRATE ER 30 MG PO TB24   Oral   Take 0.5 tablets (15 mg total) by mouth daily.   30 tablet   0   . METFORMIN HCL 500 MG PO TABS   Oral   Take 500 mg by mouth 2 (two) times daily.         . ADULT MULTIVITAMIN W/MINERALS CH   Oral   Take 1 tablet by mouth daily.         Marland Kitchen NITROFURANTOIN MACROCRYSTAL 100 MG PO CAPS   Oral   Take 100 mg by mouth at bedtime.         Marland Kitchen NITROGLYCERIN 0.4 MG SL SUBL   Sublingual   Place 1 tablet (0.4 mg total) under the tongue every 5 (five) minutes x 3 doses as needed for chest pain.   25 tablet   3   . PANTOPRAZOLE SODIUM 40 MG PO TBEC   Oral   Take 40 mg by mouth every morning.         Marland Kitchen POTASSIUM CHLORIDE CRYS ER 10 MEQ PO TBCR   Oral   Take 1 tablet (10 mEq total) by mouth daily.   30 tablet   3   . RAMIPRIL 2.5 MG PO CAPS   Oral   Take 1 capsule (2.5 mg total) by mouth daily.   30 capsule   0   . SAXAGLIPTIN HCL 5 MG PO TABS   Oral   Take 5 mg by mouth daily at 12 noon.          Marland Kitchen TAMSULOSIN HCL 0.4 MG PO CAPS   Oral   Take 0.4 mg by mouth at bedtime. For urinary health         . TICAGRELOR 90 MG PO TABS   Oral   Take 1 tablet (90 mg total) by mouth 2 (two) times daily.   60 tablet   6     BP 150/78  Pulse 81  Temp 97.8 F (36.6 C) (Oral)  Resp 20  Ht 5\' 10"  (1.778 m)  Wt 175 lb (79.379 kg)  BMI 25.11 kg/m2  SpO2 95%  Physical Exam  Nursing note and vitals reviewed. Constitutional: He is oriented to person, place, and time. He appears well-developed and  well-nourished. No distress.  HENT:  Head: Normocephalic and atraumatic.  Right Ear: External ear normal.  Left Ear: External ear normal.  Mouth/Throat: Oropharynx is clear and moist.  Neck: Normal range of motion. Neck supple.  Cardiovascular: Normal rate, regular rhythm and normal heart sounds.   Pulmonary/Chest: Effort normal and breath sounds  normal. No respiratory distress. He has no wheezes. He has no rales.  Abdominal: Soft. He exhibits no distension. There is no tenderness. There is no rebound and no guarding.  Musculoskeletal: He exhibits no edema.  Neurological: He is alert and oriented to person, place, and time. No cranial nerve deficit.  Skin: Skin is warm.  Psychiatric: He has a normal mood and affect. His behavior is normal.    ED Course  Procedures (including critical care time) DIAGNOSTIC STUDIES: Oxygen Saturation is 95% on room air, adequate by my interpretation.    COORDINATION OF CARE: 8:53 AM Discussed ED treatment with pt and pt agrees.    Results for orders placed during the hospital encounter of 08/17/12  CBC WITH DIFFERENTIAL      Component Value Range   WBC 7.3  4.0 - 10.5 K/uL   RBC 4.30  4.22 - 5.81 MIL/uL   Hemoglobin 12.6 (*) 13.0 - 17.0 g/dL   HCT 16.1 (*) 09.6 - 04.5 %   MCV 89.3  78.0 - 100.0 fL   MCH 29.3  26.0 - 34.0 pg   MCHC 32.8  30.0 - 36.0 g/dL   RDW 40.9  81.1 - 91.4 %   Platelets 231  150 - 400 K/uL   Neutrophils Relative 72  43 - 77 %   Neutro Abs 5.2  1.7 - 7.7 K/uL   Lymphocytes Relative 17  12 - 46 %   Lymphs Abs 1.3  0.7 - 4.0 K/uL   Monocytes Relative 9  3 - 12 %   Monocytes Absolute 0.7  0.1 - 1.0 K/uL   Eosinophils Relative 2  0 - 5 %   Eosinophils Absolute 0.1  0.0 - 0.7 K/uL   Basophils Relative 0  0 - 1 %   Basophils Absolute 0.0  0.0 - 0.1 K/uL  BASIC METABOLIC PANEL      Component Value Range   Sodium 134 (*) 135 - 145 mEq/L   Potassium 5.0  3.5 - 5.1 mEq/L   Chloride 100  96 - 112 mEq/L   CO2 26  19 - 32 mEq/L   Glucose, Bld 193 (*) 70 - 99 mg/dL   BUN 14  6 - 23 mg/dL   Creatinine, Ser 7.82  0.50 - 1.35 mg/dL   Calcium 9.5  8.4 - 95.6 mg/dL   GFR calc non Af Amer 81 (*) >90 mL/min   GFR calc Af Amer >90  >90 mL/min  TROPONIN I      Component Value Range   Troponin I <0.30  <0.30 ng/mL  GLUCOSE, CAPILLARY      Component Value Range    Glucose-Capillary 177 (*) 70 - 99 mg/dL      Dg Chest Portable 1 View  08/17/2012  *RADIOLOGY REPORT*  Clinical Data: Hypertension  PORTABLE CHEST - 1 VIEW  Comparison: Chest radiograph of 08/05/2012  Findings: Sternotomy wires overlie normal cardiac silhouette.  No effusion, infiltrate, or pneumothorax.  No aggressive osseous lesions.  IMPRESSION: No acute cardiopulmonary process.   Original Report Authenticated By: Genevive Bi, M.D.        Date: 08/17/2012  Rate: 79  Rhythm: normal sinus  rhythm  QRS Axis: normal  Intervals: normal  ST/T Wave abnormalities: nonspecific T wave changes  Conduction Disutrbances:none  Narrative Interpretation: Abnormal EKG  Old EKG Reviewed: changes noted--QT is 428 mg today vs 467 ms on 08/09/2012.   10:53 AM Results for orders placed during the hospital encounter of 08/17/12  CBC WITH DIFFERENTIAL      Component Value Range   WBC 7.3  4.0 - 10.5 K/uL   RBC 4.30  4.22 - 5.81 MIL/uL   Hemoglobin 12.6 (*) 13.0 - 17.0 g/dL   HCT 45.4 (*) 09.8 - 11.9 %   MCV 89.3  78.0 - 100.0 fL   MCH 29.3  26.0 - 34.0 pg   MCHC 32.8  30.0 - 36.0 g/dL   RDW 14.7  82.9 - 56.2 %   Platelets 231  150 - 400 K/uL   Neutrophils Relative 72  43 - 77 %   Neutro Abs 5.2  1.7 - 7.7 K/uL   Lymphocytes Relative 17  12 - 46 %   Lymphs Abs 1.3  0.7 - 4.0 K/uL   Monocytes Relative 9  3 - 12 %   Monocytes Absolute 0.7  0.1 - 1.0 K/uL   Eosinophils Relative 2  0 - 5 %   Eosinophils Absolute 0.1  0.0 - 0.7 K/uL   Basophils Relative 0  0 - 1 %   Basophils Absolute 0.0  0.0 - 0.1 K/uL  BASIC METABOLIC PANEL      Component Value Range   Sodium 134 (*) 135 - 145 mEq/L   Potassium 5.0  3.5 - 5.1 mEq/L   Chloride 100  96 - 112 mEq/L   CO2 26  19 - 32 mEq/L   Glucose, Bld 193 (*) 70 - 99 mg/dL   BUN 14  6 - 23 mg/dL   Creatinine, Ser 1.30  0.50 - 1.35 mg/dL   Calcium 9.5  8.4 - 86.5 mg/dL   GFR calc non Af Amer 81 (*) >90 mL/min   GFR calc Af Amer >90  >90 mL/min   TROPONIN I      Component Value Range   Troponin I <0.30  <0.30 ng/mL  GLUCOSE, CAPILLARY      Component Value Range   Glucose-Capillary 177 (*) 70 - 99 mg/dL   Dg Chest Portable 1 View  08/17/2012  *RADIOLOGY REPORT*  Clinical Data: Hypertension  PORTABLE CHEST - 1 VIEW  Comparison: Chest radiograph of 08/05/2012  Findings: Sternotomy wires overlie normal cardiac silhouette.  No effusion, infiltrate, or pneumothorax.  No aggressive osseous lesions.  IMPRESSION: No acute cardiopulmonary process.   Original Report Authenticated By: Genevive Bi, M.D.    10:53 AM Lab workup is negative.  Pt reassured.  10:57 AM Recheck: Discussed lab results and treatment course with pt. Pt is feeling better. Pt is ready for discharge.    1. Nonspecific chest pain     I personally performed the services described in this documentation, which was scribed in my presence. The recorded information has been reviewed and is accurate. Osvaldo Human, MD        Carleene Cooper III, MD 08/17/12 [phone]

## 2012-08-18 ENCOUNTER — Ambulatory Visit: Payer: Medicaid Other | Admitting: Cardiology

## 2012-08-31 ENCOUNTER — Encounter (HOSPITAL_COMMUNITY): Payer: Self-pay | Admitting: Emergency Medicine

## 2012-08-31 ENCOUNTER — Other Ambulatory Visit: Payer: Self-pay | Admitting: Cardiology

## 2012-08-31 ENCOUNTER — Emergency Department (HOSPITAL_COMMUNITY)
Admission: EM | Admit: 2012-08-31 | Discharge: 2012-08-31 | Disposition: A | Discharge status: Home / Self Care | Payer: Medicare Other | Attending: Emergency Medicine | Admitting: Emergency Medicine

## 2012-08-31 DIAGNOSIS — R739 Hyperglycemia, unspecified: Secondary | ICD-10-CM

## 2012-08-31 DIAGNOSIS — Z8673 Personal history of transient ischemic attack (TIA), and cerebral infarction without residual deficits: Secondary | ICD-10-CM | POA: Insufficient documentation

## 2012-08-31 DIAGNOSIS — Z8711 Personal history of peptic ulcer disease: Secondary | ICD-10-CM | POA: Insufficient documentation

## 2012-08-31 DIAGNOSIS — Z8701 Personal history of pneumonia (recurrent): Secondary | ICD-10-CM | POA: Insufficient documentation

## 2012-08-31 DIAGNOSIS — Z9889 Other specified postprocedural states: Secondary | ICD-10-CM | POA: Insufficient documentation

## 2012-08-31 DIAGNOSIS — Z951 Presence of aortocoronary bypass graft: Secondary | ICD-10-CM | POA: Insufficient documentation

## 2012-08-31 DIAGNOSIS — Z8679 Personal history of other diseases of the circulatory system: Secondary | ICD-10-CM | POA: Insufficient documentation

## 2012-08-31 DIAGNOSIS — E1169 Type 2 diabetes mellitus with other specified complication: Secondary | ICD-10-CM | POA: Insufficient documentation

## 2012-08-31 DIAGNOSIS — Z79899 Other long term (current) drug therapy: Secondary | ICD-10-CM | POA: Insufficient documentation

## 2012-08-31 DIAGNOSIS — Z87448 Personal history of other diseases of urinary system: Secondary | ICD-10-CM | POA: Insufficient documentation

## 2012-08-31 DIAGNOSIS — Z7982 Long term (current) use of aspirin: Secondary | ICD-10-CM | POA: Insufficient documentation

## 2012-08-31 DIAGNOSIS — Z87828 Personal history of other (healed) physical injury and trauma: Secondary | ICD-10-CM | POA: Insufficient documentation

## 2012-08-31 DIAGNOSIS — Z8739 Personal history of other diseases of the musculoskeletal system and connective tissue: Secondary | ICD-10-CM | POA: Insufficient documentation

## 2012-08-31 DIAGNOSIS — Z87891 Personal history of nicotine dependence: Secondary | ICD-10-CM | POA: Insufficient documentation

## 2012-08-31 DIAGNOSIS — K219 Gastro-esophageal reflux disease without esophagitis: Secondary | ICD-10-CM | POA: Insufficient documentation

## 2012-08-31 DIAGNOSIS — R079 Chest pain, unspecified: Secondary | ICD-10-CM | POA: Insufficient documentation

## 2012-08-31 DIAGNOSIS — E785 Hyperlipidemia, unspecified: Secondary | ICD-10-CM | POA: Insufficient documentation

## 2012-08-31 DIAGNOSIS — I10 Essential (primary) hypertension: Secondary | ICD-10-CM | POA: Insufficient documentation

## 2012-08-31 LAB — CBC WITH DIFFERENTIAL/PLATELET
Basophils Absolute: 0 10*3/uL (ref 0.0–0.1)
Basophils Relative: 0 % (ref 0–1)
Eosinophils Relative: 2 % (ref 0–5)
Lymphocytes Relative: 7 % — ABNORMAL LOW (ref 12–46)
Neutro Abs: 10.4 10*3/uL — ABNORMAL HIGH (ref 1.7–7.7)
Platelets: 149 10*3/uL — ABNORMAL LOW (ref 150–400)
RDW: 14.4 % (ref 11.5–15.5)
WBC: 12.5 10*3/uL — ABNORMAL HIGH (ref 4.0–10.5)

## 2012-08-31 LAB — BASIC METABOLIC PANEL
CO2: 23 mEq/L (ref 19–32)
Calcium: 9.4 mg/dL (ref 8.4–10.5)
Chloride: 98 mEq/L (ref 96–112)
GFR calc Af Amer: >90 mL/min (ref >90)
Sodium: 134 mEq/L — ABNORMAL LOW (ref 135–145)

## 2012-08-31 LAB — GLUCOSE, CAPILLARY: Glucose-Capillary: 244 mg/dL — ABNORMAL HIGH (ref 70–99)

## 2012-08-31 LAB — TROPONIN I: Troponin I: <0.3 ng/mL (ref <0.30)

## 2012-08-31 MED ORDER — ISOSORBIDE MONONITRATE ER 30 MG PO TB24: 15.0000 mg | ORAL_TABLET | Freq: Every day | ORAL | Status: DC

## 2012-08-31 NOTE — ED Provider Notes (Signed)
History  This chart was scribed for [name], MD by Ardeen Jourdain, ED Scribe. This patient was seen in room APA17/APA17 and the patient's care was started at 0848.  CSN: [id number]  Arrival date & time 08/31/12  1308   First MD Initiated Contact with Patient 08/31/12 0848      Chief Complaint  Patient presents with  . Abdominal Pain     The history is provided by the patient. No language interpreter was used.   Jeffrey [name] is a 79 y.o. male with a h/o DM, GERD, peptic ulcer disease and CVA who presents to the Emergency Department complaining of epigastric abdominal pain that began this morning and is gradually worsening. He states the pain radiates up his esophagus. He denies any SOB, nausea, emesis, diarrhea and diaphoresis as associated symptoms. He states he slipped and fell on the ice yesterday and scraped his hand. He denies any LOC and severe pain anywhere from the fall. He reports he has a CABG and stents placed in his heart arteries.    Past Medical History  Diagnosis Date  . Diabetes mellitus, type II   . Hyperlipidemia     Lipid profile in 02/2012:135, 227, 41, 49  . Arteriosclerotic cardiovascular disease (ASCVD)     a. CABG x 4 in 1989 (VG->OM1->OM2, VG->RCA, LIMA->LAD), b. 05/2010: DES to VG-OM1/OM2, DES to distal LCx. c. NSTEMI in 04/2011 - TO distal LCX stent and VG->OM2. d. 02/2012 NSTEMI DES to VG-OM1/continuation to OM2 occluded. e. inferior STEMI s/p DES to [facility] 06/2012.  Marland Kitchen Hypertension   . Osteoarthritis   . CVA (cerebral infarction)     details unclear. pt reports light stroke last year involving L leg. No imaging to suggest hemorrhagic etiology  . Pneumonia   . Cervical vertebral fracture   . Chronic back pain   . Benign prostatic hypertrophy     Required urinary catheter x4 weeks for retention  . Peptic ulcer disease   . Gastroesophageal reflux disease   . Ischemic cardiomyopathy     EF 40% 06/2012 (previously 50-55% 03/2012)  . DM (diabetes  mellitus)     Past Surgical History  Procedure Laterality Date  . Coronary angioplasty with stent placement      2011, 02/2012  . Tonsillectomy    . Coronary artery bypass graft      Family History  Problem Relation Age of Onset  . Early death      Parents died young, unclear reasons    History  Substance Use Topics  . Smoking status: Former Smoker -- 2.00 packs/day for 10 years    Types: Cigarettes    Quit date: 07/14/1961  . Smokeless tobacco: Current User    Types: Chew  . Alcohol Use: No      Review of Systems  All other systems reviewed and are negative.   A complete 10 system review of systems was obtained and all systems are negative except as noted in the HPI and PMH.   Allergies  Review of patient's allergies indicates no known allergies.  Home Medications   Current Outpatient Rx  Name  Route  Sig  Dispense  Refill  . albuterol (PROVENTIL HFA;VENTOLIN HFA) 108 (90 BASE) MCG/ACT inhaler   Inhalation   Inhale 2 puffs into the lungs every 6 (six) hours as needed. For shortness of breath         . aspirin 81 MG chewable tablet   Oral   Chew 1 tablet (81 mg total)  by mouth daily.         Marland Kitchen atorvastatin (LIPITOR) 80 MG tablet   Oral   Take 1 tablet (80 mg total) by mouth daily at 6 PM.   30 tablet   3   . B Complex-Biotin-FA (B-COMPLEX PO)   Oral   Take 1 tablet by mouth daily.          . calcium carbonate (TUMS - DOSED IN MG ELEMENTAL CALCIUM) 500 MG chewable tablet   Oral   Chew 1 tablet by mouth 4 (four) times daily - after meals and at bedtime.         . carvedilol (COREG) 3.125 MG tablet   Oral   Take 1 tablet (3.125 mg total) by mouth 2 (two) times daily with a meal.   60 tablet   6   . Docusate Sodium (STOOL SOFTENER) 100 MG capsule   Oral   Take 100 mg by mouth at bedtime. Constipation         . fish oil-omega-3 fatty acids 1000 MG capsule   Oral   Take 1 g by mouth daily.         . isosorbide mononitrate (IMDUR) 30  MG 24 hr tablet   Oral   Take 0.5 tablets (15 mg total) by mouth daily.   30 tablet   0   . metFORMIN (GLUCOPHAGE) 500 MG tablet   Oral   Take 500 mg by mouth 2 (two) times daily.         . Multiple Vitamin (MULTIVITAMIN WITH MINERALS) TABS   Oral   Take 1 tablet by mouth daily.         . nitrofurantoin (MACRODANTIN) 100 MG capsule   Oral   Take 100 mg by mouth at bedtime.         . nitroGLYCERIN (NITROSTAT) 0.4 MG SL tablet   Sublingual   Place 1 tablet (0.4 mg total) under the tongue every 5 (five) minutes x 3 doses as needed for chest pain.   25 tablet   3   . pantoprazole (PROTONIX) 40 MG tablet   Oral   Take 40 mg by mouth every morning.         . potassium chloride (K-DUR,KLOR-CON) 10 MEQ tablet   Oral   Take 1 tablet (10 mEq total) by mouth daily.   30 tablet   3   . ramipril (ALTACE) 2.5 MG capsule   Oral   Take 1 capsule (2.5 mg total) by mouth daily.   30 capsule   0   . saxagliptin HCl (ONGLYZA) 5 MG TABS tablet   Oral   Take 5 mg by mouth daily at 12 noon.          . Tamsulosin HCl (FLOMAX) 0.4 MG CAPS   Oral   Take 0.4 mg by mouth at bedtime. For urinary health         . Ticagrelor (BRILINTA) 90 MG TABS tablet   Oral   Take 1 tablet (90 mg total) by mouth 2 (two) times daily.   60 tablet   6     Triage Vitals: BP 133/68  Pulse 96  Resp 14  SpO2 97%  Physical Exam  Nursing note and vitals reviewed. Constitutional: He is oriented to person, place, and time. He appears well-developed and well-nourished. No distress.  HENT:  Head: Normocephalic and atraumatic.  Eyes: Conjunctivae and EOM are normal. Pupils are equal, round, and reactive to light.  Neck: Normal  range of motion. Neck supple.  Cardiovascular: Normal rate, regular rhythm and normal heart sounds.   Pulmonary/Chest: Effort normal and breath sounds normal. No respiratory distress.  Abdominal: Soft. Bowel sounds are normal. He exhibits no distension.   Musculoskeletal: Normal range of motion.  Neurological: He is alert and oriented to person, place, and time.  Skin: Skin is warm and dry. He is not diaphoretic.  Psychiatric: He has a normal mood and affect.    ED Course  Procedures (including critical care time)  DIAGNOSTIC STUDIES: Oxygen Saturation is 97% on room air, normal by my interpretation.    COORDINATION OF CARE:  9:15 AM: Discussed treatment plan which includes glucose, troponin, CBC and BMP with pt at bedside and pt agreed to plan.   Results for orders placed during the hospital encounter of 08/31/12  TROPONIN I      Result Value Range   Troponin I <0.30  <0.30 ng/mL  CBC WITH DIFFERENTIAL      Result Value Range   WBC 12.5 (*) 4.0 - 10.5 K/uL   RBC 4.29  4.22 - 5.81 MIL/uL   Hemoglobin 12.3 (*) 13.0 - 17.0 g/dL   HCT 16.1 (*) 09.6 - 04.5 %   MCV 88.3  78.0 - 100.0 fL   MCH 28.7  26.0 - 34.0 pg   MCHC 32.5  30.0 - 36.0 g/dL   RDW 40.9  81.1 - 91.4 %   Platelets 149 (*) 150 - 400 K/uL   Neutrophils Relative 83 (*) 43 - 77 %   Neutro Abs 10.4 (*) 1.7 - 7.7 K/uL   Lymphocytes Relative 7 (*) 12 - 46 %   Lymphs Abs 0.9  0.7 - 4.0 K/uL   Monocytes Relative 8  3 - 12 %   Monocytes Absolute 1.0  0.1 - 1.0 K/uL   Eosinophils Relative 2  0 - 5 %   Eosinophils Absolute 0.2  0.0 - 0.7 K/uL   Basophils Relative 0  0 - 1 %   Basophils Absolute 0.0  0.0 - 0.1 K/uL  BASIC METABOLIC PANEL      Result Value Range   Sodium 134 (*) 135 - 145 mEq/L   Potassium 4.7  3.5 - 5.1 mEq/L   Chloride 98  96 - 112 mEq/L   CO2 23  19 - 32 mEq/L   Glucose, Bld 272 (*) 70 - 99 mg/dL   BUN 13  6 - 23 mg/dL   Creatinine, Ser 7.82  0.50 - 1.35 mg/dL   Calcium 9.4  8.4 - 95.6 mg/dL   GFR calc non Af Amer 79 (*) >90 mL/min   GFR calc Af Amer >90  >90 mL/min  GLUCOSE, CAPILLARY      Result Value Range   Glucose-Capillary 244 (*) 70 - 99 mg/dL    No results found.   No diagnosis found.   Date: 08/31/2012  Rate:  Rhythm: normal  sinus rhythm  QRS Axis: normal  Intervals: normal  ST/T Wave abnormalities: TWI inf; old  Conduction Disutrbances:none  Narrative Interpretation:   Old EKG Reviewed: unchanged   MDM  Screening tests normal. Patient is regular visitor to the emergency department. He appears to be his normal self      I personally performed the services described in this documentation, which was scribed in my presence. The recorded information has been reviewed and is accurate.    [name], MD 08/31/12 1220

## 2012-08-31 NOTE — ED Notes (Signed)
Pt c/o epigastric pain radiating  Up esophagus. Woke up with this pain this am. Denies sob/n/v/d. Nad. Nondiaphoretic. Pt states he did trip and fell onto Left side. Has a bandage over L palm . C/o L knee pain. C/o belching.

## 2012-09-04 ENCOUNTER — Encounter (HOSPITAL_COMMUNITY): Payer: Self-pay | Admitting: Emergency Medicine

## 2012-09-04 ENCOUNTER — Emergency Department (HOSPITAL_COMMUNITY)
Admission: EM | Admit: 2012-09-04 | Discharge: 2012-09-04 | Disposition: A | Discharge status: Home / Self Care | Payer: Medicare Other | Attending: Emergency Medicine | Admitting: Emergency Medicine

## 2012-09-04 DIAGNOSIS — N400 Enlarged prostate without lower urinary tract symptoms: Secondary | ICD-10-CM | POA: Insufficient documentation

## 2012-09-04 DIAGNOSIS — Z8639 Personal history of other endocrine, nutritional and metabolic disease: Secondary | ICD-10-CM | POA: Insufficient documentation

## 2012-09-04 DIAGNOSIS — G8929 Other chronic pain: Secondary | ICD-10-CM | POA: Insufficient documentation

## 2012-09-04 DIAGNOSIS — Z8679 Personal history of other diseases of the circulatory system: Secondary | ICD-10-CM | POA: Insufficient documentation

## 2012-09-04 DIAGNOSIS — Z862 Personal history of diseases of the blood and blood-forming organs and certain disorders involving the immune mechanism: Secondary | ICD-10-CM | POA: Insufficient documentation

## 2012-09-04 DIAGNOSIS — Z8711 Personal history of peptic ulcer disease: Secondary | ICD-10-CM | POA: Insufficient documentation

## 2012-09-04 DIAGNOSIS — Z8781 Personal history of (healed) traumatic fracture: Secondary | ICD-10-CM | POA: Insufficient documentation

## 2012-09-04 DIAGNOSIS — M1990 Unspecified osteoarthritis, unspecified site: Secondary | ICD-10-CM | POA: Insufficient documentation

## 2012-09-04 DIAGNOSIS — Z7982 Long term (current) use of aspirin: Secondary | ICD-10-CM | POA: Insufficient documentation

## 2012-09-04 DIAGNOSIS — Z8701 Personal history of pneumonia (recurrent): Secondary | ICD-10-CM | POA: Insufficient documentation

## 2012-09-04 DIAGNOSIS — Z79899 Other long term (current) drug therapy: Secondary | ICD-10-CM | POA: Insufficient documentation

## 2012-09-04 DIAGNOSIS — K219 Gastro-esophageal reflux disease without esophagitis: Secondary | ICD-10-CM | POA: Insufficient documentation

## 2012-09-04 DIAGNOSIS — Z8673 Personal history of transient ischemic attack (TIA), and cerebral infarction without residual deficits: Secondary | ICD-10-CM | POA: Insufficient documentation

## 2012-09-04 DIAGNOSIS — E119 Type 2 diabetes mellitus without complications: Secondary | ICD-10-CM | POA: Insufficient documentation

## 2012-09-04 DIAGNOSIS — M549 Dorsalgia, unspecified: Secondary | ICD-10-CM | POA: Insufficient documentation

## 2012-09-04 DIAGNOSIS — I10 Essential (primary) hypertension: Secondary | ICD-10-CM | POA: Insufficient documentation

## 2012-09-04 DIAGNOSIS — R339 Retention of urine, unspecified: Secondary | ICD-10-CM | POA: Insufficient documentation

## 2012-09-04 DIAGNOSIS — Z87891 Personal history of nicotine dependence: Secondary | ICD-10-CM | POA: Insufficient documentation

## 2012-09-04 LAB — URINALYSIS, ROUTINE W REFLEX MICROSCOPIC
Ketones, ur: NEGATIVE mg/dL
Leukocytes, UA: NEGATIVE
Nitrite: NEGATIVE
Urobilinogen, UA: 0.2 mg/dL (ref 0.0–1.0)
pH: 7.5 (ref 5.0–8.0)

## 2012-09-04 MED ORDER — TAMSULOSIN HCL 0.4 MG PO CAPS
ORAL_CAPSULE | ORAL | Status: AC
  Filled 2012-09-04: qty 1

## 2012-09-04 MED ORDER — TAMSULOSIN HCL 0.4 MG PO CAPS
0.4000 mg | ORAL_CAPSULE | Freq: Once | ORAL | Status: AC
  Administered 2012-09-04: 0.4 mg via ORAL
  Filled 2012-09-04: qty 1

## 2012-09-04 NOTE — ED Provider Notes (Signed)
History     CSN: [id number]  Arrival date & time 09/04/12  3086   First MD Initiated Contact with Patient 09/04/12 [phone]      Chief Complaint  Patient presents with  . Abdominal Pain  . Urinary Retention    (Consider location/radiation/quality/duration/timing/severity/associated sxs/prior treatment) HPI Jeffrey [name] is a 79 y.o. male with a history of urinary retention secondary to enlarged prostate and also prior urinary tract infection who presents with suprapubic abdominal pain as well as inability to completely urinate. He denies any fevers or chills or dysuria. Denies any other abdominal pain, nausea or vomiting, he's had no diarrhea, chest pain, shortness of breath, dizziness or lightheadedness, muscle aches or rash. His symptoms have been severe and constant over the course of the last day. The symptoms started last night and had not resolved-he stopped taking his Flomax for approximately 4 or 5 days.    Past Medical History  Diagnosis Date  . Diabetes mellitus, type II   . Hyperlipidemia     Lipid profile in 02/2012:135, 227, 41, 49  . Arteriosclerotic cardiovascular disease (ASCVD)     a. CABG x 4 in 1989 (VG->OM1->OM2, VG->RCA, LIMA->LAD), b. 05/2010: DES to VG-OM1/OM2, DES to distal LCx. c. NSTEMI in 04/2011 - TO distal LCX stent and VG->OM2. d. 02/2012 NSTEMI DES to VG-OM1/continuation to OM2 occluded. e. inferior STEMI s/p DES to [facility] 06/2012.  Marland Kitchen Hypertension   . Osteoarthritis   . CVA (cerebral infarction)     details unclear. pt reports light stroke last year involving L leg. No imaging to suggest hemorrhagic etiology  . Pneumonia   . Cervical vertebral fracture   . Chronic back pain   . Benign prostatic hypertrophy     Required urinary catheter x4 weeks for retention  . Peptic ulcer disease   . Gastroesophageal reflux disease   . Ischemic cardiomyopathy     EF 40% 06/2012 (previously 50-55% 03/2012)  . DM (diabetes mellitus)     Past Surgical History   Procedure Laterality Date  . Coronary angioplasty with stent placement      2011, 02/2012  . Tonsillectomy    . Coronary artery bypass graft      Family History  Problem Relation Age of Onset  . Early death      Parents died young, unclear reasons    History  Substance Use Topics  . Smoking status: Former Smoker -- 2.00 packs/day for 10 years    Types: Cigarettes    Quit date: 07/14/1961  . Smokeless tobacco: Current User    Types: Chew  . Alcohol Use: No      Review of Systems At least 10pt or greater review of systems completed and are negative except where specified in the HPI.  Allergies  Review of patient's allergies indicates no known allergies.  Home Medications   Current Outpatient Rx  Name  Route  Sig  Dispense  Refill  . albuterol (PROVENTIL HFA;VENTOLIN HFA) 108 (90 BASE) MCG/ACT inhaler   Inhalation   Inhale 2 puffs into the lungs every 6 (six) hours as needed. For shortness of breath         . aspirin 81 MG chewable tablet   Oral   Chew 1 tablet (81 mg total) by mouth daily.         Marland Kitchen atorvastatin (LIPITOR) 80 MG tablet   Oral   Take 1 tablet (80 mg total) by mouth daily at 6 PM.   30 tablet  3   . B Complex-Biotin-FA (B-COMPLEX PO)   Oral   Take 1 tablet by mouth daily.          . calcium carbonate (TUMS - DOSED IN MG ELEMENTAL CALCIUM) 500 MG chewable tablet   Oral   Chew 1 tablet by mouth 4 (four) times daily - after meals and at bedtime.         . carvedilol (COREG) 3.125 MG tablet   Oral   Take 1 tablet (3.125 mg total) by mouth 2 (two) times daily with a meal.   60 tablet   6   . Docusate Sodium (STOOL SOFTENER) 100 MG capsule   Oral   Take 100 mg by mouth at bedtime. Constipation         . fish oil-omega-3 fatty acids 1000 MG capsule   Oral   Take 1 g by mouth daily.         . isosorbide mononitrate (IMDUR) 30 MG 24 hr tablet   Oral   Take 0.5 tablets (15 mg total) by mouth daily.   30 tablet   6     Keep  2-27 appointment   . metFORMIN (GLUCOPHAGE) 500 MG tablet   Oral   Take 500 mg by mouth 2 (two) times daily.         . Multiple Vitamin (MULTIVITAMIN WITH MINERALS) TABS   Oral   Take 1 tablet by mouth daily.         . nitrofurantoin (MACRODANTIN) 100 MG capsule   Oral   Take 100 mg by mouth at bedtime.         . nitroGLYCERIN (NITROSTAT) 0.4 MG SL tablet   Sublingual   Place 1 tablet (0.4 mg total) under the tongue every 5 (five) minutes x 3 doses as needed for chest pain.   25 tablet   3   . pantoprazole (PROTONIX) 40 MG tablet   Oral   Take 40 mg by mouth every morning.         . potassium chloride (K-DUR,KLOR-CON) 10 MEQ tablet   Oral   Take 1 tablet (10 mEq total) by mouth daily.   30 tablet   3   . ramipril (ALTACE) 2.5 MG capsule   Oral   Take 1 capsule (2.5 mg total) by mouth daily.   30 capsule   0   . saxagliptin HCl (ONGLYZA) 5 MG TABS tablet   Oral   Take 5 mg by mouth daily at 12 noon.          . Tamsulosin HCl (FLOMAX) 0.4 MG CAPS   Oral   Take 0.4 mg by mouth at bedtime. For urinary health         . Ticagrelor (BRILINTA) 90 MG TABS tablet   Oral   Take 1 tablet (90 mg total) by mouth 2 (two) times daily.   60 tablet   6     BP 129/69  Pulse 78  Temp(Src) 98 F (36.7 C) (Oral)  Resp 14  Ht 5\' 10"  (1.778 m)  Wt 180 lb (81.647 kg)  BMI 25.83 kg/m2  SpO2 97%  Physical Exam  Nursing notes reviewed.  Electronic medical record reviewed. VITAL SIGNS:   Filed Vitals:   09/04/12 0412 09/04/12 0758  BP: 129/69 130/71  Pulse: 78 80  Temp: 98 F (36.7 C)   TempSrc: Oral   Resp: 14 20  Height: 5\' 10"  (1.778 m)   Weight: 180 lb (81.647 kg)  SpO2: 97% 99%   CONSTITUTIONAL: Pleasant elderly gentleman, awake, oriented, appears non-toxic HENT: Atraumatic, normocephalic, oral mucosa pink and moist, airway patent. Nares patent without drainage. External ears normal. EYES: Conjunctiva clear, EOMI, PERRLA NECK: Trachea midline,  non-tender, supple CARDIOVASCULAR: Normal heart rate, Normal rhythm. Well-healed median sternotomy scar PULMONARY/CHEST: Clear to auscultation, no rhonchi, wheezes, or rales. Symmetrical breath sounds. Non-tender. ABDOMINAL: Non-distended, soft, non-tender - no rebound or guarding.  BS normal. Suprapubic fullness and tenderness to palpation over the urinary bladder. Ultrasound shows approximately 370 cc of urine post void. Prostate is noted to be enlarged on ultrasound NEUROLOGIC: Non-focal, moving all four extremities, no gross sensory or motor deficits. EXTREMITIES: No clubbing, cyanosis, or edema SKIN: Warm, Dry, No erythema, No rash  ED Course  Procedures (including critical care time)  Labs Reviewed  URINALYSIS, ROUTINE W REFLEX MICROSCOPIC   No results found.   1. Urinary retention   2. BPH (benign prostatic hyperplasia)      MDM  Jeffrey [name] is a 79 y.o. male who arrived complaining of urinary retention and lower abdominal pain localized to the suprapubic region.  Patient has had these issues before it has not been taking his Flomax for at least the last 5 days. He says the prescription is ready for him to pick up at the pharmacy.  Obtain a urinalysis was does not indicate any hematuria, no urinary tract infection.  Patient was straight cathed after he attempted to completely void his bladder, I performed an ultrasound and there was about 370 cc of urine post void.  Patient says he really does not want to have a leg bag again, we'll attempt a trial of voiding after another dose of Flomax.  Patient was able to eat and drink without incident, he had a postvoid residual of 238. Had discussion with the patient that he would require a catheterization and will need to followup with urology next week and have the Foley removed.    No signs of infection or any other concerning symptoms besides obstructive uropathy.  I explained the diagnosis and have given explicit precautions to  return to the ER including fevers, chills, abdominal pain, nausea vomiting, chest pain or any other new or worsening symptoms. The patient understands and accepts the medical plan as it's been dictated and I have answered their questions. Discharge instructions concerning home care and prescriptions have been given.  The patient is STABLE and is discharged to home in good condition.          Jones Skene, MD 09/04/12 [phone]

## 2012-09-04 NOTE — ED Notes (Signed)
Bladder scanned via bladder scanner for post void residual. Patient voided approximately 20 minutes ago. 170 mls in bladder per scanner. Dr Rulon Abide aware.

## 2012-09-04 NOTE — ED Notes (Signed)
200 mls of blood tinged urine in urinal at bedside with blood clot. Patient reports no pain at this time, but noticed the blood in his urine after using the urinal after the catheter.

## 2012-09-04 NOTE — ED Notes (Signed)
Patient reports he is voiding, but he feels as if it is not all coming out. Reports urgency and "trickling" of urine before he is able to get to restroom. Also reports lower abdomen tenderness and pain on urination.

## 2012-09-04 NOTE — ED Notes (Signed)
Patient with no complaints at this time. Respirations even and unlabored. Skin warm/dry. Discharge instructions reviewed with patient at this time. Patient given opportunity to voice concerns/ask questions. Patient discharged at this time and left Emergency Department with steady gait.   

## 2012-09-04 NOTE — ED Notes (Signed)
Patient complaining of urinary retention and lower abdominal pain. Reports was recently given prescription for nitrofurantoin.

## 2012-09-06 ENCOUNTER — Emergency Department (HOSPITAL_COMMUNITY)
Admission: EM | Admit: 2012-09-06 | Discharge: 2012-09-06 | Disposition: A | Discharge status: Home / Self Care | Payer: Medicare Other | Attending: Emergency Medicine | Admitting: Emergency Medicine

## 2012-09-06 ENCOUNTER — Encounter (HOSPITAL_COMMUNITY): Payer: Self-pay | Admitting: Emergency Medicine

## 2012-09-06 DIAGNOSIS — R339 Retention of urine, unspecified: Secondary | ICD-10-CM | POA: Insufficient documentation

## 2012-09-06 DIAGNOSIS — E785 Hyperlipidemia, unspecified: Secondary | ICD-10-CM | POA: Insufficient documentation

## 2012-09-06 DIAGNOSIS — Z8679 Personal history of other diseases of the circulatory system: Secondary | ICD-10-CM | POA: Insufficient documentation

## 2012-09-06 DIAGNOSIS — Z9861 Coronary angioplasty status: Secondary | ICD-10-CM | POA: Insufficient documentation

## 2012-09-06 DIAGNOSIS — E119 Type 2 diabetes mellitus without complications: Secondary | ICD-10-CM | POA: Insufficient documentation

## 2012-09-06 DIAGNOSIS — Z8739 Personal history of other diseases of the musculoskeletal system and connective tissue: Secondary | ICD-10-CM | POA: Insufficient documentation

## 2012-09-06 DIAGNOSIS — Z951 Presence of aortocoronary bypass graft: Secondary | ICD-10-CM | POA: Insufficient documentation

## 2012-09-06 DIAGNOSIS — I10 Essential (primary) hypertension: Secondary | ICD-10-CM | POA: Insufficient documentation

## 2012-09-06 DIAGNOSIS — I2510 Atherosclerotic heart disease of native coronary artery without angina pectoris: Secondary | ICD-10-CM | POA: Insufficient documentation

## 2012-09-06 DIAGNOSIS — N390 Urinary tract infection, site not specified: Secondary | ICD-10-CM | POA: Insufficient documentation

## 2012-09-06 DIAGNOSIS — Z8781 Personal history of (healed) traumatic fracture: Secondary | ICD-10-CM | POA: Insufficient documentation

## 2012-09-06 DIAGNOSIS — Z8711 Personal history of peptic ulcer disease: Secondary | ICD-10-CM | POA: Insufficient documentation

## 2012-09-06 DIAGNOSIS — Z87891 Personal history of nicotine dependence: Secondary | ICD-10-CM | POA: Insufficient documentation

## 2012-09-06 DIAGNOSIS — Z8673 Personal history of transient ischemic attack (TIA), and cerebral infarction without residual deficits: Secondary | ICD-10-CM | POA: Insufficient documentation

## 2012-09-06 DIAGNOSIS — Z7982 Long term (current) use of aspirin: Secondary | ICD-10-CM | POA: Insufficient documentation

## 2012-09-06 DIAGNOSIS — N400 Enlarged prostate without lower urinary tract symptoms: Secondary | ICD-10-CM | POA: Insufficient documentation

## 2012-09-06 DIAGNOSIS — Z8701 Personal history of pneumonia (recurrent): Secondary | ICD-10-CM | POA: Insufficient documentation

## 2012-09-06 DIAGNOSIS — Z79899 Other long term (current) drug therapy: Secondary | ICD-10-CM | POA: Insufficient documentation

## 2012-09-06 LAB — POCT I-STAT, CHEM 8
HCT: 34 % — ABNORMAL LOW (ref 39.0–52.0)
Hemoglobin: 11.6 g/dL — ABNORMAL LOW (ref 13.0–17.0)
Potassium: 4.3 mEq/L (ref 3.5–5.1)
Sodium: 137 mEq/L (ref 135–145)
TCO2: 23 mmol/L (ref 0–100)

## 2012-09-06 LAB — URINALYSIS, ROUTINE W REFLEX MICROSCOPIC
Bilirubin Urine: NEGATIVE
Nitrite: NEGATIVE
Protein, ur: 100 mg/dL — AB
Specific Gravity, Urine: 1.025 (ref 1.005–1.030)
Urobilinogen, UA: 0.2 mg/dL (ref 0.0–1.0)

## 2012-09-06 LAB — URINE MICROSCOPIC-ADD ON

## 2012-09-06 MED ORDER — CEPHALEXIN 500 MG PO CAPS: 500.0000 mg | ORAL_CAPSULE | Freq: Four times a day (QID) | ORAL | Status: DC

## 2012-09-06 NOTE — ED Provider Notes (Signed)
History     CSN: [id number]  Arrival date & time 09/06/12  6962   First MD Initiated Contact with Patient 09/06/12 [phone]      Chief Complaint  Patient presents with  . Hematuria     HPI Pt was seen at 1025.   Per pt, c/o gradual onset and persistence of constant hematuria since he had a foley placed 2 days ago for urinary retention. Denies back/flank pain, no N/V/D, no fevers, no testicular pain/swelling, no penile bleeding, no CP/SOB.       Past Medical History  Diagnosis Date  . Diabetes mellitus, type II   . Hyperlipidemia     Lipid profile in 02/2012:135, 227, 41, 49  . Arteriosclerotic cardiovascular disease (ASCVD)     a. CABG x 4 in 1989 (VG->OM1->OM2, VG->RCA, LIMA->LAD), b. 05/2010: DES to VG-OM1/OM2, DES to distal LCx. c. NSTEMI in 04/2011 - TO distal LCX stent and VG->OM2. d. 02/2012 NSTEMI DES to VG-OM1/continuation to OM2 occluded. e. inferior STEMI s/p DES to [facility] 06/2012.  Marland Kitchen Hypertension   . Osteoarthritis   . CVA (cerebral infarction)     details unclear. pt reports light stroke last year involving L leg. No imaging to suggest hemorrhagic etiology  . Pneumonia   . Cervical vertebral fracture   . Chronic back pain   . Benign prostatic hypertrophy     Required urinary catheter x4 weeks for retention  . Peptic ulcer disease   . Gastroesophageal reflux disease   . Ischemic cardiomyopathy     EF 40% 06/2012 (previously 50-55% 03/2012)  . DM (diabetes mellitus)     Past Surgical History  Procedure Laterality Date  . Coronary angioplasty with stent placement      2011, 02/2012  . Tonsillectomy    . Coronary artery bypass graft      Family History  Problem Relation Age of Onset  . Early death      Parents died young, unclear reasons    History  Substance Use Topics  . Smoking status: Former Smoker -- 2.00 packs/day for 10 years    Types: Cigarettes    Quit date: 07/14/1961  . Smokeless tobacco: Current User    Types: Chew  . Alcohol Use: No       Review of Systems ROS: Statement: All systems negative except as marked or noted in the HPI; Constitutional: Negative for fever and chills. ; ; Eyes: Negative for eye pain, redness and discharge. ; ; ENMT: Negative for ear pain, hoarseness, nasal congestion, sinus pressure and sore throat. ; ; Cardiovascular: Negative for chest pain, palpitations, diaphoresis, dyspnea and peripheral edema. ; ; Respiratory: Negative for cough, wheezing and stridor. ; ; Gastrointestinal: Negative for nausea, vomiting, diarrhea, abdominal pain, blood in stool, hematemesis, jaundice and rectal bleeding. . ; ; Genitourinary: +hematuria. Negative for dysuria, flank pain. ; ; Musculoskeletal: Negative for back pain and neck pain. Negative for swelling and trauma.; ; Skin: Negative for pruritus, rash, abrasions, blisters, bruising and skin lesion.; ; Neuro: Negative for headache, lightheadedness and neck stiffness. Negative for weakness, altered level of consciousness , altered mental status, extremity weakness, paresthesias, involuntary movement, seizure and syncope.      Allergies  Review of patient's allergies indicates no known allergies.  Home Medications   Current Outpatient Rx  Name  Route  Sig  Dispense  Refill  . albuterol (PROVENTIL HFA;VENTOLIN HFA) 108 (90 BASE) MCG/ACT inhaler   Inhalation   Inhale 2 puffs into the lungs every 6 (six)  hours as needed. For shortness of breath         . aspirin 81 MG chewable tablet   Oral   Chew 1 tablet (81 mg total) by mouth daily.         Marland Kitchen atorvastatin (LIPITOR) 80 MG tablet   Oral   Take 1 tablet (80 mg total) by mouth daily at 6 PM.   30 tablet   3   . B Complex-Biotin-FA (B-COMPLEX PO)   Oral   Take 1 tablet by mouth daily.          . calcium carbonate (TUMS - DOSED IN MG ELEMENTAL CALCIUM) 500 MG chewable tablet   Oral   Chew 1 tablet by mouth 4 (four) times daily - after meals and at bedtime.         . carvedilol (COREG) 3.125 MG  tablet   Oral   Take 1 tablet (3.125 mg total) by mouth 2 (two) times daily with a meal.   60 tablet   6   . Docusate Sodium (STOOL SOFTENER) 100 MG capsule   Oral   Take 100 mg by mouth at bedtime. Constipation         . fish oil-omega-3 fatty acids 1000 MG capsule   Oral   Take 1 g by mouth daily.         . isosorbide mononitrate (IMDUR) 30 MG 24 hr tablet   Oral   Take 0.5 tablets (15 mg total) by mouth daily.   30 tablet   6     Keep 2-27 appointment   . metFORMIN (GLUCOPHAGE) 500 MG tablet   Oral   Take 500 mg by mouth 2 (two) times daily.         . Multiple Vitamin (MULTIVITAMIN WITH MINERALS) TABS   Oral   Take 1 tablet by mouth daily.         . nitrofurantoin (MACRODANTIN) 100 MG capsule   Oral   Take 100 mg by mouth at bedtime.         . nitroGLYCERIN (NITROSTAT) 0.4 MG SL tablet   Sublingual   Place 1 tablet (0.4 mg total) under the tongue every 5 (five) minutes x 3 doses as needed for chest pain.   25 tablet   3   . pantoprazole (PROTONIX) 40 MG tablet   Oral   Take 40 mg by mouth daily.          . potassium chloride (K-DUR,KLOR-CON) 10 MEQ tablet   Oral   Take 1 tablet (10 mEq total) by mouth daily.   30 tablet   3   . ramipril (ALTACE) 2.5 MG capsule   Oral   Take 1 capsule (2.5 mg total) by mouth daily.   30 capsule   0   . saxagliptin HCl (ONGLYZA) 5 MG TABS tablet   Oral   Take 5 mg by mouth daily.          . Tamsulosin HCl (FLOMAX) 0.4 MG CAPS   Oral   Take 0.4 mg by mouth at bedtime. For urinary health         . Ticagrelor (BRILINTA) 90 MG TABS tablet   Oral   Take 1 tablet (90 mg total) by mouth 2 (two) times daily.   60 tablet   6     BP 139/73  Pulse 91  Temp(Src) 98.1 F (36.7 C) (Oral)  Resp 20  Ht 5\' 10"  (1.778 m)  Wt 175 lb (79.379 kg)  BMI 25.11 kg/m2  SpO2 97%  Physical Exam 1030: Physical examination:  Nursing notes reviewed; Vital signs and O2 SAT reviewed;  Constitutional: Well developed,  Well nourished, Well hydrated, In no acute distress; Head:  Normocephalic, atraumatic; Eyes: EOMI, PERRL, No scleral icterus; ENMT: Mouth and pharynx normal, Mucous membranes moist; Neck: Supple, Full range of motion, No lymphadenopathy; Cardiovascular: Regular rate and rhythm, No gallop; Respiratory: Breath sounds clear & equal bilaterally, No rales, rhonchi, wheezes.  Speaking full sentences with ease, Normal respiratory effort/excursion; Chest: Nontender, Movement normal; Abdomen: Soft, Nontender, Nondistended, Normal bowel sounds; Genitourinary: No CVA tenderness. +foley draining clear/yellow urine in tubing and leg bag. No bleeding or urine leakage from around foley at urinary meatus..; Extremities: Pulses normal, No tenderness, No edema, No calf edema or asymmetry.; Neuro: AA&Ox3, Major CN grossly intact.  Speech clear. No gross focal motor or sensory deficits in extremities.; Skin: Color normal, Warm, Dry.   ED Course  Procedures    MDM  MDM Reviewed: previous chart, nursing note and vitals Reviewed previous: labs Interpretation: labs     Results for orders placed during the hospital encounter of 09/06/12  URINALYSIS, ROUTINE W REFLEX MICROSCOPIC      Result Value Range   Color, Urine YELLOW  YELLOW   APPearance CLEAR  CLEAR   Specific Gravity, Urine 1.025  1.005 - 1.030   pH 6.0  5.0 - 8.0   Glucose, UA 500 (*) NEGATIVE mg/dL   Hgb urine dipstick LARGE (*) NEGATIVE   Bilirubin Urine NEGATIVE  NEGATIVE   Ketones, ur TRACE (*) NEGATIVE mg/dL   Protein, ur 161 (*) NEGATIVE mg/dL   Urobilinogen, UA 0.2  0.0 - 1.0 mg/dL   Nitrite NEGATIVE  NEGATIVE   Leukocytes, UA NEGATIVE  NEGATIVE  URINE MICROSCOPIC-ADD ON      Result Value Range   RBC / HPF TOO NUMEROUS TO COUNT  <3 RBC/hpf   Bacteria, UA FEW (*) RARE  POCT I-STAT, CHEM 8      Result Value Range   Sodium 137  135 - 145 mEq/L   Potassium 4.3  3.5 - 5.1 mEq/L   Chloride 104  96 - 112 mEq/L   BUN 17  6 - 23 mg/dL    Creatinine, Ser 0.96  0.50 - 1.35 mg/dL   Glucose, Bld 045 (*) 70 - 99 mg/dL   Calcium, Ion 4.09  8.11 - 1.30 mmol/L   TCO2 23  0 - 100 mmol/L   Hemoglobin 11.6 (*) 13.0 - 17.0 g/dL   HCT 91.4 (*) 78.2 - 95.6 %    Results for TEOMAN, GIRAUD (MRN [id number]) as of 09/06/2012 13:11  Ref. Range 08/05/2012 09:23 08/09/2012 11:29 08/17/2012 08:37 08/31/2012 09:01 09/06/2012 10:16  Hemoglobin Latest Range: 13.0-17.0 g/dL 46.9 (L) 62.9 (L) 52.8 (L) 12.3 (L) 11.6 (L)  HCT Latest Range: 39.0-52.0 % 33.3 (L) 35.5 (L) 38.4 (L) 37.9 (L) 34.0 (L)    1230:  Pt has gotten himself dressed and is walking around the ED with steady gait, easy resps. VSS. Wants to go home now.  Continues without gross hematuria in foley.  BUN/Cr and H/H are stable.  UC pending.  Pt strongly encouraged to f/u with PMD and Uro MD this week. Dx and testing d/w pt.  Questions answered.  Verb understanding, agreeable to d/c home with outpt f/u.          Laray Anger, DO 09/08/12 2308

## 2012-09-06 NOTE — ED Notes (Signed)
Pt seen here on 09-04-12 for same. Pt states still peeing a lot of blood.

## 2012-09-07 LAB — URINE CULTURE: Culture: NO GROWTH

## 2012-09-09 ENCOUNTER — Encounter: Payer: Self-pay | Admitting: Cardiology

## 2012-09-09 ENCOUNTER — Ambulatory Visit (INDEPENDENT_AMBULATORY_CARE_PROVIDER_SITE_OTHER): Payer: Medicare Other | Admitting: Cardiology

## 2012-09-09 VITALS — BP 130/80 | HR 89 | Ht 70.0 in | Wt 177.0 lb

## 2012-09-09 DIAGNOSIS — E785 Hyperlipidemia, unspecified: Secondary | ICD-10-CM

## 2012-09-09 DIAGNOSIS — I679 Cerebrovascular disease, unspecified: Secondary | ICD-10-CM

## 2012-09-09 DIAGNOSIS — I951 Orthostatic hypotension: Secondary | ICD-10-CM

## 2012-09-09 DIAGNOSIS — N400 Enlarged prostate without lower urinary tract symptoms: Secondary | ICD-10-CM

## 2012-09-09 NOTE — Assessment & Plan Note (Addendum)
Lipid profile a few months ago was excellent except for elevated triglycerides, which can likely be improved with better control of diabetes.

## 2012-09-09 NOTE — Assessment & Plan Note (Addendum)
Patient advised not to increase his dose of Flomax or other medications without specific instructions and will resume his usual daily dose.  He is scheduled to return to see Dr. Jerre Simon in the near future for catheter removal.

## 2012-09-09 NOTE — Assessment & Plan Note (Signed)
Probably requires increased therapy for diabetes. Patient will discuss this with Dr. Sherwood Gambler at his next visit.

## 2012-09-09 NOTE — Progress Notes (Deleted)
Name: Jeffrey [name]    DOB: 1928-09-25  Age: 79 y.o.  MR#: [id number]       PCP:  [name], MD      Insurance: Payor: MEDICARE  Plan: MEDICARE PART A AND B  Product Type: *No Product type*    CC:   No chief complaint on file.  MEDICATION LIST ED 2/18 FOR CP ECHO 06/19/12 CUS 06/08/12  VS Filed Vitals:   09/09/12 1420  BP: 130/80  Pulse: 89  Height: 5\' 10"  (1.778 m)  Weight: 177 lb (80.287 kg)  SpO2: 96%    Weights Current Weight  09/09/12 177 lb (80.287 kg)  09/06/12 175 lb (79.379 kg)  09/04/12 180 lb (81.647 kg)    Blood Pressure  BP Readings from Last 3 Encounters:  09/09/12 130/80  09/06/12 139/73  09/04/12 130/71     Admit date:  (Not on file) Last encounter with RMR:  08/18/2012   Allergy Review of patient's allergies indicates no known allergies.  Current Outpatient Prescriptions  Medication Sig Dispense Refill  . albuterol (PROVENTIL HFA;VENTOLIN HFA) 108 (90 BASE) MCG/ACT inhaler Inhale 2 puffs into the lungs every 6 (six) hours as needed. For shortness of breath      . aspirin 81 MG chewable tablet Chew 1 tablet (81 mg total) by mouth daily.      Marland Kitchen atorvastatin (LIPITOR) 80 MG tablet Take 1 tablet (80 mg total) by mouth daily at 6 PM.  30 tablet  3  . B Complex-Biotin-FA (B-COMPLEX PO) Take 1 tablet by mouth daily.       . calcium carbonate (TUMS - DOSED IN MG ELEMENTAL CALCIUM) 500 MG chewable tablet Chew 1 tablet by mouth 4 (four) times daily - after meals and at bedtime.      . carvedilol (COREG) 3.125 MG tablet Take 1 tablet (3.125 mg total) by mouth 2 (two) times daily with a meal.  60 tablet  6  . cephALEXin (KEFLEX) 500 MG capsule Take 1 capsule (500 mg total) by mouth 4 (four) times daily.  40 capsule  0  . Docusate Sodium (STOOL SOFTENER) 100 MG capsule Take 100 mg by mouth at bedtime. Constipation      . fish oil-omega-3 fatty acids 1000 MG capsule Take 1 g by mouth daily.      . isosorbide mononitrate (IMDUR) 30 MG 24 hr tablet Take 0.5  tablets (15 mg total) by mouth daily.  30 tablet  6  . metFORMIN (GLUCOPHAGE) 500 MG tablet Take 500 mg by mouth 2 (two) times daily.      . Multiple Vitamin (MULTIVITAMIN WITH MINERALS) TABS Take 1 tablet by mouth daily.      . nitrofurantoin (MACRODANTIN) 100 MG capsule Take 100 mg by mouth at bedtime.      . nitrofurantoin, macrocrystal-monohydrate, (MACROBID) 100 MG capsule       . nitroGLYCERIN (NITROSTAT) 0.4 MG SL tablet Place 1 tablet (0.4 mg total) under the tongue every 5 (five) minutes x 3 doses as needed for chest pain.  25 tablet  3  . pantoprazole (PROTONIX) 40 MG tablet Take 40 mg by mouth daily.       . potassium chloride (K-DUR,KLOR-CON) 10 MEQ tablet Take 1 tablet (10 mEq total) by mouth daily.  30 tablet  3  . ramipril (ALTACE) 2.5 MG capsule Take 1 capsule (2.5 mg total) by mouth daily.  30 capsule  0  . saxagliptin HCl (ONGLYZA) 5 MG TABS tablet Take 5 mg by mouth  daily.       . Tamsulosin HCl (FLOMAX) 0.4 MG CAPS Take 0.4 mg by mouth at bedtime. For urinary health      . Ticagrelor (BRILINTA) 90 MG TABS tablet Take 1 tablet (90 mg total) by mouth 2 (two) times daily.  60 tablet  6   No current facility-administered medications for this visit.    Discontinued Meds:   There are no discontinued medications.  Patient Active Problem List  Diagnosis  . BPH (benign prostatic hyperplasia)  . Hyperlipidemia  . Dementia  . Arteriosclerotic cardiovascular disease (ASCVD)  . Diabetes mellitus, type II  . Hypertension  . Cerebrovascular disease  . ST elevation myocardial infarction (STEMI) of inferoposterior wall, initial episode of care  . Cardiomyopathy, ischemic  . Chronic combined systolic and diastolic CHF (congestive heart failure)  . CAD (coronary artery disease) of artery bypass graft  . Fatigue  . Weakness  . Hyperglycemia  . Chest pain  . Orthostatic hypotension    LABS    Component Value Date/Time   NA 137 09/06/2012 1016   NA 134* 08/31/2012 0901   NA  134* 08/17/2012 0837   K 4.3 09/06/2012 1016   K 4.7 08/31/2012 0901   K 5.0 08/17/2012 0837   CL 104 09/06/2012 1016   CL 98 08/31/2012 0901   CL 100 08/17/2012 0837   CO2 23 08/31/2012 0901   CO2 26 08/17/2012 0837   CO2 23 08/09/2012 1129   GLUCOSE 227* 09/06/2012 1016   GLUCOSE 272* 08/31/2012 0901   GLUCOSE 193* 08/17/2012 0837   BUN 17 09/06/2012 1016   BUN 13 08/31/2012 0901   BUN 14 08/17/2012 0837   CREATININE 0.90 09/06/2012 1016   CREATININE 0.86 08/31/2012 0901   CREATININE 0.81 08/17/2012 0837   CREATININE 0.98 12/04/2011 1208   CALCIUM 9.4 08/31/2012 0901   CALCIUM 9.5 08/17/2012 0837   CALCIUM 9.4 08/09/2012 1129   GFRNONAA 79* 08/31/2012 0901   GFRNONAA 81* 08/17/2012 0837   GFRNONAA 76* 08/09/2012 1129   GFRAA >90 08/31/2012 0901   GFRAA >90 08/17/2012 0837   GFRAA 88* 08/09/2012 1129   CMP     Component Value Date/Time   NA 137 09/06/2012 1016   K 4.3 09/06/2012 1016   CL 104 09/06/2012 1016   CO2 23 08/31/2012 0901   GLUCOSE 227* 09/06/2012 1016   BUN 17 09/06/2012 1016   CREATININE 0.90 09/06/2012 1016   CREATININE 0.98 12/04/2011 1208   CALCIUM 9.4 08/31/2012 0901   PROT 6.8 08/09/2012 1129   ALBUMIN 3.5 08/09/2012 1129   AST 24 08/09/2012 1129   ALT 24 08/09/2012 1129   ALKPHOS 77 08/09/2012 1129   BILITOT 0.1* 08/09/2012 1129   GFRNONAA 79* 08/31/2012 0901   GFRAA >90 08/31/2012 0901       Component Value Date/Time   WBC 12.5* 08/31/2012 0901   WBC 7.3 08/17/2012 0837   WBC 6.9 08/09/2012 1129   HGB 11.6* 09/06/2012 1016   HGB 12.3* 08/31/2012 0901   HGB 12.6* 08/17/2012 0837   HCT 34.0* 09/06/2012 1016   HCT 37.9* 08/31/2012 0901   HCT 38.4* 08/17/2012 0837   MCV 88.3 08/31/2012 0901   MCV 89.3 08/17/2012 0837   MCV 89.0 08/09/2012 1129    Lipid Panel     Component Value Date/Time   CHOL 113 06/19/2012 0545   TRIG 165* 06/19/2012 0545   HDL 40 06/19/2012 0545   CHOLHDL 2.8 06/19/2012 0545   VLDL 33 06/19/2012 0545  LDLCALC 40 06/19/2012 0545    ABG    Component Value Date/Time   TCO2 23  09/06/2012 1016     Lab Results  Component Value Date   TSH 0.580 07/29/2012   BNP (last 3 results)  Recent Labs  04/17/12 1521 07/29/12 1340 07/29/12 1855  PROBNP 438.7 863.6* 845.2*   Cardiac Panel (last 3 results) No results found for this basename: CKTOTAL, CKMB, TROPONINI, RELINDX,  in the last 72 hours  Iron/TIBC/Ferritin No results found for this basename: iron, tibc, ferritin     EKG Orders placed during the hospital encounter of 08/31/12  . ED EKG  . ED EKG  . EKG 12-LEAD  . EKG 12-LEAD  . EKG     Prior Assessment and Plan Problem List as of 09/09/2012     ICD-9-CM     Cardiology Problems   Hyperlipidemia   Last Assessment & Plan   06/08/2012 Office Visit Written 06/08/2012  8:49 PM by Kathlen Brunswick, MD     Excellent lipid values when last assessed 3 months ago.  Current therapy will be continued.    Arteriosclerotic cardiovascular disease (ASCVD)   Last Assessment & Plan   08/02/2012 Office Visit Written 08/02/2012  3:59 PM by Jodelle Gross, NP     He is without cardiac complaint today, no further chest pain or epigastric pain with use of PPI and nitrates. He is not very active, and energy level is low. He is reluctant to be referred to cardiac rehab, and prefers to exercise at home on his own, when he "feels like it."  He wants a pill to make him feel better, but I doubt any new medication will change anything. He is on optimal medical management, and other than lack of energy, he is doing ok. He has multiple excuses for his lack of motivation to do more for himself other than take his medications and come to ER for recurrent symptoms. He has stopped taking ACE as he feels it drops his BP too much.    This is a difficult situation with him, and I will continue to encourage compliance and increased activity. He will see Dr.Rothbart in one month. Perhaps he needs more frequent follow ups to keep him out of the hospital and under close observation for a  while. He is advised to drink more fluids today as he has been having a lot of diarrhea from use of MOM.     Hypertension   Last Assessment & Plan   08/02/2012 Office Visit Written 08/02/2012  4:01 PM by Jodelle Gross, NP     Low normal today. Likely from dehydration from excessive diarrhea with use of laxatives. I have asked him to take in more fluids today to help to rehydrate. He has stopped the ACE inhibitor, which under the conditions, was a good choice as he is hypotensive today. Will see him in one month.     Cerebrovascular disease   Last Assessment & Plan   06/08/2012 Office Visit Written 06/08/2012  8:57 PM by Kathlen Brunswick, MD     Bilateral carotid bruits.  Carotid ultrasound study will be performed.    ST elevation myocardial infarction (STEMI) of inferoposterior wall, initial episode of care   Cardiomyopathy, ischemic   Last Assessment & Plan   08/02/2012 Office Visit Written 08/02/2012  4:02 PM by Jodelle Gross, NP     Will continue on carvedilol, ASA and follow. EF 40% on last cardiac cath in  December of 2013. No ACE at this time as he refuses.    Chronic combined systolic and diastolic CHF (congestive heart failure)   CAD (coronary artery disease) of artery bypass graft   Orthostatic hypotension     Other   BPH (benign prostatic hyperplasia)   Dementia   Last Assessment & Plan   06/08/2012 Office Visit Written 06/08/2012  8:49 PM by Kathlen Brunswick, MD     Patient is relatively functional and has resumed driving, but clearly has some impairment in mental capacity which most immediately provides challenges in terms of maintaining a stable and optimal medical regime.  We have asked him to return with all medication vials for a review of same.    Diabetes mellitus, type II   Last Assessment & Plan   04/21/2012 Office Visit Written 04/22/2012  7:51 PM by Kathlen Brunswick, MD     A1c-7.4 in 03/2012 indicating relatively good control of diabetes.    Fatigue    Weakness   Hyperglycemia   Chest pain       Imaging: Dg Chest Portable 1 View  08/17/2012  *RADIOLOGY REPORT*  Clinical Data: Hypertension  PORTABLE CHEST - 1 VIEW  Comparison: Chest radiograph of 08/05/2012  Findings: Sternotomy wires overlie normal cardiac silhouette.  No effusion, infiltrate, or pneumothorax.  No aggressive osseous lesions.  IMPRESSION: No acute cardiopulmonary process.   Original Report Authenticated By: Genevive Bi, M.D.

## 2012-09-09 NOTE — Assessment & Plan Note (Signed)
No symptoms nor evidence for hypotension at present.

## 2012-09-09 NOTE — Patient Instructions (Addendum)
Your physician recommends that you schedule a follow-up appointment in: 9 months  

## 2012-09-09 NOTE — Progress Notes (Signed)
Patient ID: Jeffrey [name], male   DOB: Mar 19, 1929, 79 y.o.   MRN: [id number]  HPI: Schedule return visit for this very nice older gentleman with coronary artery disease. From a cardiac standpoint, he has done well since he was last seen 2 months ago, but unfortunately he developed acute urinary retention prompting placement of a catheter in the emergency department and referral to a urologist. He has been treated successfully with Flomax for a prior episode of obstruction related to BPH, but now apparently has either a urinary tract infection or prostatitis for which he is currently being treated with antibiotics. Creatinine was normal in the emergency department despite his presentation with hematuria, dysuria and obstructive uropathy.  Current Outpatient Prescriptions  Medication Sig Dispense Refill  . albuterol (PROVENTIL HFA;VENTOLIN HFA) 108 (90 BASE) MCG/ACT inhaler Inhale 2 puffs into the lungs every 6 (six) hours as needed. For shortness of breath      . aspirin 81 MG chewable tablet Chew 1 tablet (81 mg total) by mouth daily.      Marland Kitchen atorvastatin (LIPITOR) 80 MG tablet Take 1 tablet (80 mg total) by mouth daily at 6 PM.  30 tablet  3  . B Complex-Biotin-FA (B-COMPLEX PO) Take 1 tablet by mouth daily.       . calcium carbonate (TUMS - DOSED IN MG ELEMENTAL CALCIUM) 500 MG chewable tablet Chew 1 tablet by mouth 4 (four) times daily - after meals and at bedtime.      . carvedilol (COREG) 3.125 MG tablet Take 1 tablet (3.125 mg total) by mouth 2 (two) times daily with a meal.  60 tablet  6  . cephALEXin (KEFLEX) 500 MG capsule Take 1 capsule (500 mg total) by mouth 4 (four) times daily.  40 capsule  0  . Docusate Sodium (STOOL SOFTENER) 100 MG capsule Take 100 mg by mouth at bedtime. Constipation      . fish oil-omega-3 fatty acids 1000 MG capsule Take 1 g by mouth daily.      . isosorbide mononitrate (IMDUR) 30 MG 24 hr tablet Take 0.5 tablets (15 mg total) by mouth daily.  30 tablet  6  .  metFORMIN (GLUCOPHAGE) 500 MG tablet Take 500 mg by mouth 2 (two) times daily.      . Multiple Vitamin (MULTIVITAMIN WITH MINERALS) TABS Take 1 tablet by mouth daily.      . nitrofurantoin (MACRODANTIN) 100 MG capsule Take 100 mg by mouth at bedtime.      . nitrofurantoin, macrocrystal-monohydrate, (MACROBID) 100 MG capsule       . nitroGLYCERIN (NITROSTAT) 0.4 MG SL tablet Place 1 tablet (0.4 mg total) under the tongue every 5 (five) minutes x 3 doses as needed for chest pain.  25 tablet  3  . pantoprazole (PROTONIX) 40 MG tablet Take 40 mg by mouth daily.       . potassium chloride (K-DUR,KLOR-CON) 10 MEQ tablet Take 1 tablet (10 mEq total) by mouth daily.  30 tablet  3  . ramipril (ALTACE) 2.5 MG capsule Take 1 capsule (2.5 mg total) by mouth daily.  30 capsule  0  . saxagliptin HCl (ONGLYZA) 5 MG TABS tablet Take 5 mg by mouth daily.       . Tamsulosin HCl (FLOMAX) 0.4 MG CAPS Take 0.4 mg by mouth at bedtime. For urinary health      . Ticagrelor (BRILINTA) 90 MG TABS tablet Take 1 tablet (90 mg total) by mouth 2 (two) times daily.  60 tablet  6   No current facility-administered medications for this visit.   No Known Allergies    Past medical history, social history, and family history reviewed and updated.  ROS: Denies chest pain, dyspnea, orthopnea, PND, lightheadedness or syncope. He reports taking it upon himself to increase his dose of Flomax to twice a day. All other systems reviewed and are negative.  PHYSICAL EXAM: BP 130/80  Pulse 89  Ht 5\' 10"  (1.778 m)  Wt 80.287 kg (177 lb)  BMI 25.4 kg/m2  SpO2 96%;  Body mass index is 25.4 kg/(m^2). General-Well developed; no acute distress Body habitus-proportionate weight and height Neck-No JVD; no carotid bruits Lungs-clear lung fields; resonant to percussion Cardiovascular-normal PMI; distant S1 and S2; irregular rhythm Abdomen-normal bowel sounds; soft and non-tender without masses or organomegaly Musculoskeletal-No  deformities, no cyanosis or clubbing Neurologic-Normal cranial nerves; symmetric strength and tone Skin-Warm, multiple keratoses Extremities-distal pulses intact; minimal edema  Niles Bing, MD 09/09/2012  3:30 PM  ASSESSMENT AND PLAN

## 2012-09-09 NOTE — Assessment & Plan Note (Signed)
Recent carotid ultrasound negative for obstruction. Optimal control of cardiovascular risk factors appears effective in preventing progression of disease and will be continued.

## 2012-09-09 NOTE — Assessment & Plan Note (Signed)
No recent symptoms to suggest progression of coronary disease. Ischemic cardiomyopathy is moderate in severity, and CHF is well compensated with current therapy.

## 2012-09-23 ENCOUNTER — Encounter (HOSPITAL_COMMUNITY): Payer: Self-pay | Admitting: Emergency Medicine

## 2012-09-23 ENCOUNTER — Emergency Department (HOSPITAL_COMMUNITY)
Admission: EM | Admit: 2012-09-23 | Discharge: 2012-09-23 | Disposition: A | Discharge status: Home / Self Care | Payer: Medicare Other | Attending: Emergency Medicine | Admitting: Emergency Medicine

## 2012-09-23 DIAGNOSIS — Z79899 Other long term (current) drug therapy: Secondary | ICD-10-CM | POA: Insufficient documentation

## 2012-09-23 DIAGNOSIS — G8929 Other chronic pain: Secondary | ICD-10-CM | POA: Insufficient documentation

## 2012-09-23 DIAGNOSIS — Z8711 Personal history of peptic ulcer disease: Secondary | ICD-10-CM | POA: Insufficient documentation

## 2012-09-23 DIAGNOSIS — Z8679 Personal history of other diseases of the circulatory system: Secondary | ICD-10-CM | POA: Insufficient documentation

## 2012-09-23 DIAGNOSIS — Z8739 Personal history of other diseases of the musculoskeletal system and connective tissue: Secondary | ICD-10-CM | POA: Insufficient documentation

## 2012-09-23 DIAGNOSIS — Z8701 Personal history of pneumonia (recurrent): Secondary | ICD-10-CM | POA: Insufficient documentation

## 2012-09-23 DIAGNOSIS — I10 Essential (primary) hypertension: Secondary | ICD-10-CM | POA: Insufficient documentation

## 2012-09-23 DIAGNOSIS — Z792 Long term (current) use of antibiotics: Secondary | ICD-10-CM | POA: Insufficient documentation

## 2012-09-23 DIAGNOSIS — I2510 Atherosclerotic heart disease of native coronary artery without angina pectoris: Secondary | ICD-10-CM | POA: Insufficient documentation

## 2012-09-23 DIAGNOSIS — I252 Old myocardial infarction: Secondary | ICD-10-CM | POA: Insufficient documentation

## 2012-09-23 DIAGNOSIS — Z8781 Personal history of (healed) traumatic fracture: Secondary | ICD-10-CM | POA: Insufficient documentation

## 2012-09-23 DIAGNOSIS — N400 Enlarged prostate without lower urinary tract symptoms: Secondary | ICD-10-CM | POA: Insufficient documentation

## 2012-09-23 DIAGNOSIS — Z87891 Personal history of nicotine dependence: Secondary | ICD-10-CM | POA: Insufficient documentation

## 2012-09-23 DIAGNOSIS — Z7982 Long term (current) use of aspirin: Secondary | ICD-10-CM | POA: Insufficient documentation

## 2012-09-23 DIAGNOSIS — E785 Hyperlipidemia, unspecified: Secondary | ICD-10-CM | POA: Insufficient documentation

## 2012-09-23 DIAGNOSIS — J029 Acute pharyngitis, unspecified: Secondary | ICD-10-CM | POA: Insufficient documentation

## 2012-09-23 DIAGNOSIS — Z951 Presence of aortocoronary bypass graft: Secondary | ICD-10-CM | POA: Insufficient documentation

## 2012-09-23 DIAGNOSIS — E119 Type 2 diabetes mellitus without complications: Secondary | ICD-10-CM | POA: Insufficient documentation

## 2012-09-23 DIAGNOSIS — K219 Gastro-esophageal reflux disease without esophagitis: Secondary | ICD-10-CM | POA: Insufficient documentation

## 2012-09-23 DIAGNOSIS — Z8673 Personal history of transient ischemic attack (TIA), and cerebral infarction without residual deficits: Secondary | ICD-10-CM | POA: Insufficient documentation

## 2012-09-23 MED ORDER — RANITIDINE HCL 150 MG PO TABS: 150.0000 mg | ORAL_TABLET | Freq: Two times a day (BID) | ORAL | Status: DC

## 2012-09-23 MED ORDER — ALUM & MAG HYDROXIDE-SIMETH 200-200-20 MG/5ML PO SUSP
15.0000 mL | Freq: Once | ORAL | Status: AC
Start: 2012-09-23 — End: 2012-09-23
  Administered 2012-09-23: 15 mL via ORAL
  Filled 2012-09-23: qty 30

## 2012-09-23 MED ORDER — DIPHENHYDRAMINE HCL 25 MG PO CAPS
25.0000 mg | ORAL_CAPSULE | Freq: Once | ORAL | Status: AC
  Administered 2012-09-23: 25 mg via ORAL
  Filled 2012-09-23: qty 1

## 2012-09-23 MED ORDER — FAMOTIDINE 20 MG PO TABS
20.0000 mg | ORAL_TABLET | Freq: Once | ORAL | Status: AC
  Administered 2012-09-23: 20 mg via ORAL
  Filled 2012-09-23: qty 1

## 2012-09-23 NOTE — ED Notes (Signed)
Patient states he chews on flavored cigars; states he was chewing on a new flavor.  Patient states throat began burning; denies any shortness of breath.  Respirations even and unlabored; able to speak in complete sentences without difficulty.

## 2012-09-23 NOTE — ED Notes (Addendum)
Pt c/o burning in throat that began earlier today. Pt states he was "sucking on a flavored cigar" when the symptoms began. Pt denies SOB and difficulty swallowing.

## 2012-09-23 NOTE — ED Provider Notes (Signed)
History     CSN: [id number]  Arrival date & time 09/23/12  1906   First MD Initiated Contact with Patient 09/23/12 1941      Chief Complaint  Patient presents with  . Allergic Reaction    (Consider location/radiation/quality/duration/timing/severity/associated sxs/prior treatment) Patient is a 79 y.o. male presenting with allergic reaction. The history is provided by the patient.  Allergic Reaction The primary symptoms do not include shortness of breath, abdominal pain, nausea, vomiting, dizziness or rash. Primary symptoms comment: sore throat. The current episode started 3 to 5 hours ago. The problem has not changed since onset.This is a new problem.  Associated with: chewing a new flavor cigar.  Patient reports that this afternoon he was chewing on a new flavor cigar and began feeling burning on his tongue and throat. He drank water and milk and then ate peppermint but still felt the burning so decided to come in. He denies shortness of breath, cough or wheezing. Denies swelling of tongue or difficulty swallowing.   Past Medical History  Diagnosis Date  . Diabetes mellitus, type II   . Hyperlipidemia     Lipid profile in 02/2012:135, 227, 41, 49  . Arteriosclerotic cardiovascular disease (ASCVD)     a. CABG x 4 in 1989 (VG->OM1->OM2, VG->RCA, LIMA->LAD), b. 05/2010: DES to VG-OM1/OM2, DES to distal LCx. c. NSTEMI in 04/2011 - TO distal LCX stent and VG->OM2. d. 02/2012 NSTEMI DES to VG-OM1/continuation to OM2 occluded. e. inferior STEMI s/p DES to [facility] 06/2012.  Marland Kitchen Hypertension   . Osteoarthritis   . CVA (cerebral infarction)     details unclear. pt reports light stroke last year involving L leg. No imaging to suggest hemorrhagic etiology  . Pneumonia   . Cervical vertebral fracture   . Chronic back pain   . Benign prostatic hypertrophy     Required urinary catheter x4 weeks for retention  . Peptic ulcer disease   . Gastroesophageal reflux disease   . Ischemic  cardiomyopathy     EF 40% 06/2012 (previously 50-55% 03/2012)  . DM (diabetes mellitus)     Past Surgical History  Procedure Laterality Date  . Coronary angioplasty with stent placement      2011, 02/2012  . Tonsillectomy    . Coronary artery bypass graft      Family History  Problem Relation Age of Onset  . Early death      Parents died young, unclear reasons    History  Substance Use Topics  . Smoking status: Former Smoker -- 2.00 packs/day for 10 years    Types: Cigarettes    Quit date: 07/14/1961  . Smokeless tobacco: Current User    Types: Chew  . Alcohol Use: No      Review of Systems  Constitutional: Negative for fever and chills.  HENT: Positive for sore throat.   Respiratory: Negative for shortness of breath.   Cardiovascular: Negative for chest pain.  Gastrointestinal: Negative for nausea, vomiting and abdominal pain.  Skin: Negative for rash.  Neurological: Negative for dizziness and speech difficulty.  Psychiatric/Behavioral: Negative for confusion.    Allergies  Review of patient's allergies indicates no known allergies.  Home Medications   Current Outpatient Rx  Name  Route  Sig  Dispense  Refill  . albuterol (PROVENTIL HFA;VENTOLIN HFA) 108 (90 BASE) MCG/ACT inhaler   Inhalation   Inhale 2 puffs into the lungs every 6 (six) hours as needed. For shortness of breath         .  aspirin 81 MG chewable tablet   Oral   Chew 1 tablet (81 mg total) by mouth daily.         Marland Kitchen atorvastatin (LIPITOR) 80 MG tablet   Oral   Take 1 tablet (80 mg total) by mouth daily at 6 PM.   30 tablet   3   . B Complex-Biotin-FA (B-COMPLEX PO)   Oral   Take 1 tablet by mouth daily.          . calcium carbonate (TUMS - DOSED IN MG ELEMENTAL CALCIUM) 500 MG chewable tablet   Oral   Chew 1 tablet by mouth 4 (four) times daily - after meals and at bedtime.         . carvedilol (COREG) 3.125 MG tablet   Oral   Take 1 tablet (3.125 mg total) by mouth 2 (two)  times daily with a meal.   60 tablet   6   . cephALEXin (KEFLEX) 500 MG capsule   Oral   Take 1 capsule (500 mg total) by mouth 4 (four) times daily.   40 capsule   0   . Docusate Sodium (STOOL SOFTENER) 100 MG capsule   Oral   Take 100 mg by mouth at bedtime. Constipation         . fish oil-omega-3 fatty acids 1000 MG capsule   Oral   Take 1 g by mouth daily.         . isosorbide mononitrate (IMDUR) 30 MG 24 hr tablet   Oral   Take 0.5 tablets (15 mg total) by mouth daily.   30 tablet   6     Keep 2-27 appointment   . metFORMIN (GLUCOPHAGE) 500 MG tablet   Oral   Take 500 mg by mouth 2 (two) times daily.         . Multiple Vitamin (MULTIVITAMIN WITH MINERALS) TABS   Oral   Take 1 tablet by mouth daily.         . nitrofurantoin (MACRODANTIN) 100 MG capsule   Oral   Take 100 mg by mouth at bedtime.         . nitrofurantoin, macrocrystal-monohydrate, (MACROBID) 100 MG capsule               . nitroGLYCERIN (NITROSTAT) 0.4 MG SL tablet   Sublingual   Place 1 tablet (0.4 mg total) under the tongue every 5 (five) minutes x 3 doses as needed for chest pain.   25 tablet   3   . pantoprazole (PROTONIX) 40 MG tablet   Oral   Take 40 mg by mouth daily.          . potassium chloride (K-DUR,KLOR-CON) 10 MEQ tablet   Oral   Take 1 tablet (10 mEq total) by mouth daily.   30 tablet   3   . ramipril (ALTACE) 2.5 MG capsule   Oral   Take 1 capsule (2.5 mg total) by mouth daily.   30 capsule   0   . saxagliptin HCl (ONGLYZA) 5 MG TABS tablet   Oral   Take 5 mg by mouth daily.          . Tamsulosin HCl (FLOMAX) 0.4 MG CAPS   Oral   Take 0.4 mg by mouth at bedtime. For urinary health         . Ticagrelor (BRILINTA) 90 MG TABS tablet   Oral   Take 1 tablet (90 mg total) by mouth 2 (two) times daily.  60 tablet   6     BP 147/83  Pulse 90  Temp(Src) 97.7 F (36.5 C) (Oral)  Resp 16  Ht 5\' 10"  (1.778 m)  Wt 175 lb (79.379 kg)  BMI  25.11 kg/m2  SpO2 98%  Physical Exam  Nursing note and vitals reviewed. Constitutional: He is oriented to person, place, and time. He appears well-developed and well-nourished. No distress.  HENT:  Head: Atraumatic.  Nose: Nose normal.  Mouth/Throat: Uvula is midline and mucous membranes are normal. Posterior oropharyngeal erythema present.  Now swelling of throat noted. Mild erythema posterior pharynx. Patient swallowing without difficulty.  Eyes: EOM are normal.  Neck: Neck supple.  Cardiovascular: Normal rate.   Pulmonary/Chest: Effort normal and breath sounds normal. He has no wheezes.  Musculoskeletal: Normal range of motion.  Neurological: He is alert and oriented to person, place, and time.  Skin: Skin is warm and dry. No rash noted.  Psychiatric: He has a normal mood and affect. His behavior is normal. Judgment and thought content normal.  Assessment: 79 y.o. male with sore throat after chewing a new flavor cigar   No difficulty breathing or swallowing   Mucous membrane irritation   Plan:  Benadryl 25 mg PO   Mylanta 15 ml  PO   Pepcid 20 mg PO   Observe  ED Course  Procedures (including critical care time) Dr. Lars Mage in to examine the patient.   MDM  79 y.o. male with sore throat after chewing a flavored cigar has had no wheezing, shortness of breath, difficulty swallowing, rash or other problems since arrival to the ED. Patient feeling better after benadryl, Pepcid and Mylanta. Will d/c home with instructions for allergic reaction. Discussed with patient in detail plan of care and he voices understanding.  I have reviewed this patient's vital signs and  nurses notes. Patient feeling better and ready for d/c home.        [facility] Orlene Och, [place] 09/24/12 [phone]

## 2012-09-25 NOTE — ED Provider Notes (Signed)
Medical screening examination/treatment/procedure(s) were performed by non-physician practitioner and as supervising physician I was immediately available for consultation/collaboration. Belkis Norbeck, MD, FACEP   Donica Derouin L Malina Geers, MD 09/25/12 2009 

## 2012-09-29 ENCOUNTER — Encounter (HOSPITAL_COMMUNITY): Payer: Self-pay

## 2012-09-29 ENCOUNTER — Emergency Department (HOSPITAL_COMMUNITY)
Admission: EM | Admit: 2012-09-29 | Discharge: 2012-09-29 | Disposition: A | Discharge status: Home / Self Care | Payer: Medicare Other | Attending: Emergency Medicine | Admitting: Emergency Medicine

## 2012-09-29 DIAGNOSIS — R5381 Other malaise: Secondary | ICD-10-CM | POA: Insufficient documentation

## 2012-09-29 DIAGNOSIS — Z87448 Personal history of other diseases of urinary system: Secondary | ICD-10-CM | POA: Insufficient documentation

## 2012-09-29 DIAGNOSIS — G8929 Other chronic pain: Secondary | ICD-10-CM | POA: Insufficient documentation

## 2012-09-29 DIAGNOSIS — E1169 Type 2 diabetes mellitus with other specified complication: Secondary | ICD-10-CM | POA: Insufficient documentation

## 2012-09-29 DIAGNOSIS — Z951 Presence of aortocoronary bypass graft: Secondary | ICD-10-CM | POA: Insufficient documentation

## 2012-09-29 DIAGNOSIS — E785 Hyperlipidemia, unspecified: Secondary | ICD-10-CM | POA: Insufficient documentation

## 2012-09-29 DIAGNOSIS — I2510 Atherosclerotic heart disease of native coronary artery without angina pectoris: Secondary | ICD-10-CM | POA: Insufficient documentation

## 2012-09-29 DIAGNOSIS — Z79899 Other long term (current) drug therapy: Secondary | ICD-10-CM | POA: Insufficient documentation

## 2012-09-29 DIAGNOSIS — Z9861 Coronary angioplasty status: Secondary | ICD-10-CM | POA: Insufficient documentation

## 2012-09-29 DIAGNOSIS — Z87891 Personal history of nicotine dependence: Secondary | ICD-10-CM | POA: Insufficient documentation

## 2012-09-29 DIAGNOSIS — K219 Gastro-esophageal reflux disease without esophagitis: Secondary | ICD-10-CM | POA: Insufficient documentation

## 2012-09-29 DIAGNOSIS — M1990 Unspecified osteoarthritis, unspecified site: Secondary | ICD-10-CM | POA: Insufficient documentation

## 2012-09-29 DIAGNOSIS — Z7982 Long term (current) use of aspirin: Secondary | ICD-10-CM | POA: Insufficient documentation

## 2012-09-29 DIAGNOSIS — Z8673 Personal history of transient ischemic attack (TIA), and cerebral infarction without residual deficits: Secondary | ICD-10-CM | POA: Insufficient documentation

## 2012-09-29 DIAGNOSIS — I2589 Other forms of chronic ischemic heart disease: Secondary | ICD-10-CM | POA: Insufficient documentation

## 2012-09-29 DIAGNOSIS — K279 Peptic ulcer, site unspecified, unspecified as acute or chronic, without hemorrhage or perforation: Secondary | ICD-10-CM | POA: Insufficient documentation

## 2012-09-29 DIAGNOSIS — Z8781 Personal history of (healed) traumatic fracture: Secondary | ICD-10-CM | POA: Insufficient documentation

## 2012-09-29 DIAGNOSIS — R61 Generalized hyperhidrosis: Secondary | ICD-10-CM | POA: Insufficient documentation

## 2012-09-29 DIAGNOSIS — I10 Essential (primary) hypertension: Secondary | ICD-10-CM | POA: Insufficient documentation

## 2012-09-29 DIAGNOSIS — R6883 Chills (without fever): Secondary | ICD-10-CM | POA: Insufficient documentation

## 2012-09-29 LAB — URINALYSIS, ROUTINE W REFLEX MICROSCOPIC
Bilirubin Urine: NEGATIVE
Hgb urine dipstick: NEGATIVE
Specific Gravity, Urine: 1.02 (ref 1.005–1.030)
pH: 8.5 — ABNORMAL HIGH (ref 5.0–8.0)

## 2012-09-29 LAB — CBC WITH DIFFERENTIAL/PLATELET
Basophils Relative: 0 % (ref 0–1)
HCT: 34.2 % — ABNORMAL LOW (ref 39.0–52.0)
Hemoglobin: 11.3 g/dL — ABNORMAL LOW (ref 13.0–17.0)
Lymphs Abs: 1.3 10*3/uL (ref 0.7–4.0)
MCH: 28.7 pg (ref 26.0–34.0)
MCHC: 33 g/dL (ref 30.0–36.0)
Monocytes Absolute: 0.8 10*3/uL (ref 0.1–1.0)
Monocytes Relative: 10 % (ref 3–12)
Neutro Abs: 5.3 10*3/uL (ref 1.7–7.7)

## 2012-09-29 LAB — URINE MICROSCOPIC-ADD ON

## 2012-09-29 LAB — BASIC METABOLIC PANEL
BUN: 12 mg/dL (ref 6–23)
Chloride: 99 mEq/L (ref 96–112)
GFR calc Af Amer: >90 mL/min (ref >90)
Glucose, Bld: 229 mg/dL — ABNORMAL HIGH (ref 70–99)
Potassium: 4.4 mEq/L (ref 3.5–5.1)

## 2012-09-29 NOTE — ED Notes (Signed)
Pt reports weakness since last night.  Pt reports "i woke up with chills and a cold sweat this morning".  Pt denies any cp, nausea, dizziness.

## 2012-09-29 NOTE — ED Notes (Signed)
Dr. Wickline at bedside.  

## 2012-09-29 NOTE — ED Notes (Signed)
Patient ambulated to bathroom.  Patient wanted to empty his leg bag.

## 2012-09-29 NOTE — ED Provider Notes (Signed)
History     This chart was scribed for Jeffrey Gaskins, MD, MD by Smitty Pluck, ED Scribe. The patient was seen in room APA10/APA10 and the patient's care was started at 10:17 AM.   CSN: [id number]  Arrival date & time 09/29/12  0906      Chief Complaint  Patient presents with  . Weakness  . Chills    Patient is a 79 y.o. male presenting with weakness. The history is provided by the patient and medical records. No language interpreter was used.  Weakness This is a new problem. The current episode started yesterday. The problem occurs constantly. The problem has not changed since onset.Nothing aggravates the symptoms. Nothing relieves the symptoms. He has tried nothing for the symptoms.   GENNIE [name] is a 79 y.o. male who presents to the Emergency Department complaining of moderate generalized weakness onset 1 day ago. Pt reports that he awoke with chills and sweats today. Pt denies LOC, fever, chills, nausea, vomiting, diarrhea, headache, chest pain, cough, SOB and any other pain. Pt mentions that he has a foley catheter in place.  Cardiologist is Dr. Dietrich Pates PCP is Dr. Sherwood Gambler    Past Medical History  Diagnosis Date  . Diabetes mellitus, type II   . Hyperlipidemia     Lipid profile in 02/2012:135, 227, 41, 49  . Arteriosclerotic cardiovascular disease (ASCVD)     a. CABG x 4 in 1989 (VG->OM1->OM2, VG->RCA, LIMA->LAD), b. 05/2010: DES to VG-OM1/OM2, DES to distal LCx. c. NSTEMI in 04/2011 - TO distal LCX stent and VG->OM2. d. 02/2012 NSTEMI DES to VG-OM1/continuation to OM2 occluded. e. inferior STEMI s/p DES to [facility] 06/2012.  Marland [name] Hypertension   . Osteoarthritis   . CVA (cerebral infarction)     details unclear. pt reports light stroke last year involving L leg. No imaging to suggest hemorrhagic etiology  . Pneumonia   . Cervical vertebral fracture   . Chronic back pain   . Benign prostatic hypertrophy     Required urinary catheter x4 weeks for retention  . Peptic  ulcer disease   . Gastroesophageal reflux disease   . Ischemic cardiomyopathy     EF 40% 06/2012 (previously 50-55% 03/2012)  . DM (diabetes mellitus)     Past Surgical History  Procedure Laterality Date  . Coronary angioplasty with stent placement      2011, 02/2012  . Tonsillectomy    . Coronary artery bypass graft      Family History  Problem Relation Age of Onset  . Early death      Parents died young, unclear reasons    History  Substance Use Topics  . Smoking status: Former Smoker -- 2.00 packs/day for 10 years    Types: Cigarettes    Quit date: 07/14/1961  . Smokeless tobacco: Current User    Types: Chew  . Alcohol Use: No      Review of Systems  Constitutional: Positive for chills.  Neurological: Positive for weakness.  All other systems reviewed and are negative.    Allergies  Review of patient's allergies indicates no known allergies.  Home Medications   Current Outpatient Rx  Name  Route  Sig  Dispense  Refill  . albuterol (PROVENTIL HFA;VENTOLIN HFA) 108 (90 BASE) MCG/ACT inhaler   Inhalation   Inhale 2 puffs into the lungs every 6 (six) hours as needed. For shortness of breath         . aspirin 81 MG chewable tablet   Oral  Chew 1 tablet (81 mg total) by mouth daily.         Marland [name] atorvastatin (LIPITOR) 80 MG tablet   Oral   Take 1 tablet (80 mg total) by mouth daily at 6 PM.   30 tablet   3   . B Complex-Biotin-FA (B-COMPLEX PO)   Oral   Take 1 tablet by mouth daily.          . calcium carbonate (TUMS - DOSED IN MG ELEMENTAL CALCIUM) 500 MG chewable tablet   Oral   Chew 1 tablet by mouth 4 (four) times daily - after meals and at bedtime.         . carvedilol (COREG) 3.125 MG tablet   Oral   Take 1 tablet (3.125 mg total) by mouth 2 (two) times daily with a meal.   60 tablet   6   . Docusate Sodium (STOOL SOFTENER) 100 MG capsule   Oral   Take 100 mg by mouth at bedtime. Constipation         . fish oil-omega-3 fatty  acids 1000 MG capsule   Oral   Take 1 g by mouth daily.         . isosorbide mononitrate (IMDUR) 30 MG 24 hr tablet   Oral   Take 0.5 tablets (15 mg total) by mouth daily.   30 tablet   6     Keep 2-27 appointment   . metFORMIN (GLUCOPHAGE) 500 MG tablet   Oral   Take 500 mg by mouth 2 (two) times daily.         . Multiple Vitamin (MULTIVITAMIN WITH MINERALS) TABS   Oral   Take 1 tablet by mouth daily.         . nitrofurantoin (MACRODANTIN) 100 MG capsule   Oral   Take 100 mg by mouth at bedtime.         . nitroGLYCERIN (NITROSTAT) 0.4 MG SL tablet   Sublingual   Place 1 tablet (0.4 mg total) under the tongue every 5 (five) minutes x 3 doses as needed for chest pain.   25 tablet   3   . pantoprazole (PROTONIX) 40 MG tablet   Oral   Take 40 mg by mouth daily.          . potassium chloride (K-DUR,KLOR-CON) 10 MEQ tablet   Oral   Take 1 tablet (10 mEq total) by mouth daily.   30 tablet   3   . ramipril (ALTACE) 2.5 MG capsule   Oral   Take 1 capsule (2.5 mg total) by mouth daily.   30 capsule   0   . ranitidine (ZANTAC) 150 MG tablet   Oral   Take 1 tablet (150 mg total) by mouth 2 (two) times daily.   10 tablet   0   . saxagliptin HCl (ONGLYZA) 5 MG TABS tablet   Oral   Take 5 mg by mouth daily.          . Tamsulosin HCl (FLOMAX) 0.4 MG CAPS   Oral   Take 0.4 mg by mouth at bedtime. For urinary health         . Ticagrelor (BRILINTA) 90 MG TABS tablet   Oral   Take 1 tablet (90 mg total) by mouth 2 (two) times daily.   60 tablet   6     BP 129/72  Pulse 90  Temp(Src) 97.4 F (36.3 C) (Oral)  Resp 18  SpO2 98% BP 147/76  Pulse 75  Temp(Src) 97.4 F (36.3 C) (Oral)  Resp 15  SpO2 98%   Physical Exam  Nursing note and vitals reviewed. CONSTITUTIONAL: Well developed/well nourished HEAD: Normocephalic/atraumatic EYES: EOMI/PERRL ENMT: Mucous membranes moist NECK: supple no meningeal signs SPINE:entire spine nontender CV:  S1/S2 noted, no murmurs/rubs/gallops noted LUNGS: Lungs are clear to auscultation bilaterally, no apparent distress ABDOMEN: soft, nontender, no rebound or guarding GU:no cva tenderness, catheter in place, no signs of trauma, yellow urine noted in bag NEURO: Pt is awake/alert, moves all extremitiesx4, no arm or leg drift is noted EXTREMITIES: pulses normal, full ROM SKIN: warm, color normal PSYCH: no abnormalities of mood noted   ED Course  Procedures (including critical care time) DIAGNOSTIC STUDIES: Oxygen Saturation is 98% on room air, normal by my interpretation.    COORDINATION OF CARE: 10:21 AM Discussed ED treatment with pt and pt agrees.     Labs Reviewed  URINALYSIS, ROUTINE W REFLEX MICROSCOPIC - Abnormal; Notable for the following:    pH 8.5 (*)    Protein, ur TRACE (*)    All other components within normal limits  BASIC METABOLIC PANEL - Abnormal; Notable for the following:    Sodium 134 (*)    Glucose, Bld 229 (*)    GFR calc non Af Amer 78 (*)    All other components within normal limits  CBC WITH DIFFERENTIAL - Abnormal; Notable for the following:    RBC 3.94 (*)    Hemoglobin 11.3 (*)    HCT 34.2 (*)    All other components within normal limits  TROPONIN I  URINE MICROSCOPIC-ADD ON   12:35 PM Pt without active CP, but does have abnormal EKG D/w dr tom wall, will review EKG and call back  I spoke to tom wall.  He reviewed EKGs electronically and this patient's history.  He does not feel by EKG alone that pt needs admission or further evaluation.  Pt is without CP/SOB.  He is well appearing.  Troponin x2 was negative.  It seems reasonable given low likelihood of ACS given history/exam, feel he is safe for d/c.  Dr wall will help coordinate outpatient cardiology and PCP followup.    MDM  Nursing notes including past medical history and social history reviewed and considered in documentation Labs/vital reviewed and considered Previous records reviewed and  considered - h/o CAD        Date: 09/29/2012 0935am  Rate: 81  Rhythm: normal sinus rhythm  QRS Axis: normal  Intervals: normal  ST/T Wave abnormalities: ST depressions laterally  Conduction Disutrbances:none  Narrative Interpretation:   Old EKG Reviewed: changes noted St depression appears more pronounced in today's EKG No STEMI     Date: 09/29/2012 1104am  Rate: 79  Rhythm: normal sinus rhythm  QRS Axis: normal  Intervals: normal  ST/T Wave abnormalities: ST depressions laterally  Conduction Disutrbances:none  Narrative Interpretation:   Old EKG Reviewed: unchanged from earlier today     I personally performed the services described in this documentation, which was scribed in my presence. The recorded information has been reviewed and is accurate.      Jeffrey Gaskins, MD 09/29/12 [phone]

## 2012-09-30 ENCOUNTER — Other Ambulatory Visit (HOSPITAL_COMMUNITY): Payer: Medicare Other

## 2012-10-04 LAB — URINALYSIS, ROUTINE W REFLEX MICROSCOPIC

## 2012-10-06 ENCOUNTER — Inpatient Hospital Stay (HOSPITAL_COMMUNITY)
Admission: EM | Admit: 2012-10-06 | Discharge: 2012-10-09 | DRG: 247 | Disposition: A | Discharge status: Home Health | Payer: Medicare Other | Attending: Cardiovascular Disease | Admitting: Cardiovascular Disease

## 2012-10-06 ENCOUNTER — Inpatient Hospital Stay (HOSPITAL_COMMUNITY): Payer: Medicare Other

## 2012-10-06 ENCOUNTER — Encounter (HOSPITAL_COMMUNITY)
Admission: EM | Disposition: A | Discharge status: Home Health | Payer: Self-pay | Source: Home / Self Care | Attending: Cardiovascular Disease

## 2012-10-06 ENCOUNTER — Encounter (HOSPITAL_COMMUNITY): Payer: Self-pay | Admitting: *Deleted

## 2012-10-06 DIAGNOSIS — N400 Enlarged prostate without lower urinary tract symptoms: Secondary | ICD-10-CM

## 2012-10-06 DIAGNOSIS — I25810 Atherosclerosis of coronary artery bypass graft(s) without angina pectoris: Secondary | ICD-10-CM

## 2012-10-06 DIAGNOSIS — K219 Gastro-esophageal reflux disease without esophagitis: Secondary | ICD-10-CM | POA: Diagnosis present

## 2012-10-06 DIAGNOSIS — I2119 ST elevation (STEMI) myocardial infarction involving other coronary artery of inferior wall: Secondary | ICD-10-CM

## 2012-10-06 DIAGNOSIS — Z8673 Personal history of transient ischemic attack (TIA), and cerebral infarction without residual deficits: Secondary | ICD-10-CM

## 2012-10-06 DIAGNOSIS — I7090 Unspecified atherosclerosis: Secondary | ICD-10-CM | POA: Diagnosis present

## 2012-10-06 DIAGNOSIS — E119 Type 2 diabetes mellitus without complications: Secondary | ICD-10-CM | POA: Diagnosis present

## 2012-10-06 DIAGNOSIS — Z7982 Long term (current) use of aspirin: Secondary | ICD-10-CM

## 2012-10-06 DIAGNOSIS — I255 Ischemic cardiomyopathy: Secondary | ICD-10-CM

## 2012-10-06 DIAGNOSIS — Z87891 Personal history of nicotine dependence: Secondary | ICD-10-CM

## 2012-10-06 DIAGNOSIS — I10 Essential (primary) hypertension: Secondary | ICD-10-CM | POA: Diagnosis present

## 2012-10-06 DIAGNOSIS — Z79899 Other long term (current) drug therapy: Secondary | ICD-10-CM

## 2012-10-06 DIAGNOSIS — I252 Old myocardial infarction: Secondary | ICD-10-CM

## 2012-10-06 DIAGNOSIS — I2582 Chronic total occlusion of coronary artery: Secondary | ICD-10-CM | POA: Diagnosis present

## 2012-10-06 DIAGNOSIS — E785 Hyperlipidemia, unspecified: Secondary | ICD-10-CM | POA: Diagnosis present

## 2012-10-06 DIAGNOSIS — Y849 Medical procedure, unspecified as the cause of abnormal reaction of the patient, or of later complication, without mention of misadventure at the time of the procedure: Secondary | ICD-10-CM | POA: Diagnosis present

## 2012-10-06 DIAGNOSIS — T82897A Other specified complication of cardiac prosthetic devices, implants and grafts, initial encounter: Secondary | ICD-10-CM | POA: Diagnosis present

## 2012-10-06 DIAGNOSIS — I2589 Other forms of chronic ischemic heart disease: Secondary | ICD-10-CM

## 2012-10-06 DIAGNOSIS — I2510 Atherosclerotic heart disease of native coronary artery without angina pectoris: Secondary | ICD-10-CM | POA: Diagnosis present

## 2012-10-06 DIAGNOSIS — M1990 Unspecified osteoarthritis, unspecified site: Secondary | ICD-10-CM | POA: Diagnosis present

## 2012-10-06 HISTORY — PX: PERCUTANEOUS CORONARY STENT INTERVENTION (PCI-S): SHX5485

## 2012-10-06 HISTORY — PX: LEFT HEART CATHETERIZATION WITH CORONARY/GRAFT ANGIOGRAM: SHX5450

## 2012-10-06 LAB — POCT ACTIVATED CLOTTING TIME
Activated Clotting Time: 502 seconds
Activated Clotting Time: 170 seconds

## 2012-10-06 LAB — POCT I-STAT, CHEM 8
Chloride: 105 mEq/L (ref 96–112)
Glucose, Bld: 210 mg/dL — ABNORMAL HIGH (ref 70–99)
HCT: 36 % — ABNORMAL LOW (ref 39.0–52.0)
Potassium: 4.3 mEq/L (ref 3.5–5.1)
Sodium: 138 mEq/L (ref 135–145)

## 2012-10-06 LAB — TROPONIN I: Troponin I: 1.7 ng/mL (ref <0.30)

## 2012-10-06 LAB — POCT I-STAT TROPONIN I: Troponin i, poc: 0.02 ng/mL (ref 0.00–0.08)

## 2012-10-06 LAB — PLATELET INHIBITION P2Y12: Platelet Function  P2Y12: 267 [PRU] (ref 194–418)

## 2012-10-06 LAB — MRSA PCR SCREENING: MRSA by PCR: NEGATIVE

## 2012-10-06 SURGERY — LEFT HEART CATHETERIZATION WITH CORONARY/GRAFT ANGIOGRAM

## 2012-10-06 MED ORDER — NITROGLYCERIN 0.4 MG SL SUBL: 0.4000 mg | SUBLINGUAL_TABLET | SUBLINGUAL | Status: DC | PRN

## 2012-10-06 MED ORDER — ASPIRIN 81 MG PO CHEW
81.0000 mg | CHEWABLE_TABLET | Freq: Every day | ORAL | Status: DC
  Administered 2012-10-07 – 2012-10-09 (×3): 81 mg via ORAL
  Filled 2012-10-06 (×3): qty 1

## 2012-10-06 MED ORDER — NITROFURANTOIN MACROCRYSTAL 100 MG PO CAPS
100.0000 mg | ORAL_CAPSULE | Freq: Every day | ORAL | Status: DC
  Administered 2012-10-06 – 2012-10-08 (×3): 100 mg via ORAL
  Filled 2012-10-06 (×4): qty 1

## 2012-10-06 MED ORDER — CALCIUM CARBONATE ANTACID 500 MG PO CHEW
1.0000 | CHEWABLE_TABLET | Freq: Three times a day (TID) | ORAL | Status: DC
  Administered 2012-10-06 – 2012-10-09 (×9): 200 mg via ORAL
  Filled 2012-10-06 (×14): qty 1

## 2012-10-06 MED ORDER — BIVALIRUDIN 250 MG IV SOLR
INTRAVENOUS | Status: AC
  Filled 2012-10-06: qty 250

## 2012-10-06 MED ORDER — RAMIPRIL 2.5 MG PO CAPS
2.5000 mg | ORAL_CAPSULE | Freq: Every day | ORAL | Status: DC
  Administered 2012-10-07 – 2012-10-08 (×2): 2.5 mg via ORAL
  Filled 2012-10-06 (×3): qty 1

## 2012-10-06 MED ORDER — HEPARIN (PORCINE) IN NACL 100-0.45 UNIT/ML-% IJ SOLN
INTRAMUSCULAR | Status: AC
  Filled 2012-10-06: qty 250

## 2012-10-06 MED ORDER — B COMPLEX-C PO TABS
1.0000 | ORAL_TABLET | Freq: Every day | ORAL | Status: DC
  Administered 2012-10-07 – 2012-10-09 (×3): 1 via ORAL
  Filled 2012-10-06 (×3): qty 1

## 2012-10-06 MED ORDER — OMEGA-3-ACID ETHYL ESTERS 1 G PO CAPS
1.0000 g | ORAL_CAPSULE | Freq: Every day | ORAL | Status: DC
  Administered 2012-10-07 – 2012-10-09 (×3): 1 g via ORAL
  Filled 2012-10-06 (×3): qty 1

## 2012-10-06 MED ORDER — LINAGLIPTIN 5 MG PO TABS
5.0000 mg | ORAL_TABLET | Freq: Every day | ORAL | Status: DC
  Administered 2012-10-07 – 2012-10-09 (×3): 5 mg via ORAL
  Filled 2012-10-06 (×3): qty 1

## 2012-10-06 MED ORDER — DOCUSATE SODIUM 100 MG PO CAPS
100.0000 mg | ORAL_CAPSULE | Freq: Every day | ORAL | Status: DC
  Administered 2012-10-07 – 2012-10-09 (×3): 100 mg via ORAL
  Filled 2012-10-06 (×3): qty 1

## 2012-10-06 MED ORDER — FENTANYL CITRATE 0.05 MG/ML IJ SOLN
INTRAMUSCULAR | Status: AC
  Filled 2012-10-06: qty 2

## 2012-10-06 MED ORDER — OMEGA-3 FATTY ACIDS 1000 MG PO CAPS: 1.0000 g | ORAL_CAPSULE | Freq: Every day | ORAL | Status: DC

## 2012-10-06 MED ORDER — POTASSIUM CHLORIDE CRYS ER 10 MEQ PO TBCR
10.0000 meq | EXTENDED_RELEASE_TABLET | Freq: Every day | ORAL | Status: DC
  Administered 2012-10-07 – 2012-10-09 (×3): 10 meq via ORAL
  Filled 2012-10-06 (×3): qty 1

## 2012-10-06 MED ORDER — ADULT MULTIVITAMIN W/MINERALS CH
1.0000 | ORAL_TABLET | Freq: Every day | ORAL | Status: DC
  Administered 2012-10-07 – 2012-10-09 (×3): 1 via ORAL
  Filled 2012-10-06 (×3): qty 1

## 2012-10-06 MED ORDER — B-COMPLEX PO TABS: 1.0000 | ORAL_TABLET | Freq: Every day | ORAL | Status: DC

## 2012-10-06 MED ORDER — ALBUTEROL SULFATE HFA 108 (90 BASE) MCG/ACT IN AERS
2.0000 | INHALATION_SPRAY | Freq: Four times a day (QID) | RESPIRATORY_TRACT | Status: DC | PRN
  Filled 2012-10-06: qty 6.7

## 2012-10-06 MED ORDER — LIDOCAINE HCL (PF) 1 % IJ SOLN
INTRAMUSCULAR | Status: AC
  Filled 2012-10-06: qty 30

## 2012-10-06 MED ORDER — SODIUM CHLORIDE 0.9 % IV SOLN
INTRAVENOUS | Status: AC
  Administered 2012-10-06: 18:00:00 via INTRAVENOUS

## 2012-10-06 MED ORDER — ASPIRIN 325 MG PO TABS
ORAL_TABLET | ORAL | Status: AC
  Administered 2012-10-06: 325 mg
  Filled 2012-10-06: qty 1

## 2012-10-06 MED ORDER — CARVEDILOL 3.125 MG PO TABS
3.1250 mg | ORAL_TABLET | Freq: Two times a day (BID) | ORAL | Status: DC
  Administered 2012-10-06 – 2012-10-09 (×6): 3.125 mg via ORAL
  Filled 2012-10-06 (×8): qty 1

## 2012-10-06 MED ORDER — SODIUM CHLORIDE 0.9 % IV SOLN: 250.0000 mL | INTRAVENOUS | Status: DC | PRN

## 2012-10-06 MED ORDER — SODIUM CHLORIDE 0.9 % IJ SOLN
3.0000 mL | Freq: Two times a day (BID) | INTRAMUSCULAR | Status: DC
  Administered 2012-10-06 – 2012-10-09 (×4): 3 mL via INTRAVENOUS

## 2012-10-06 MED ORDER — ATORVASTATIN CALCIUM 80 MG PO TABS
80.0000 mg | ORAL_TABLET | Freq: Every day | ORAL | Status: DC
  Administered 2012-10-06 – 2012-10-08 (×3): 80 mg via ORAL
  Filled 2012-10-06 (×4): qty 1

## 2012-10-06 MED ORDER — ALPRAZOLAM 0.25 MG PO TABS: 0.2500 mg | ORAL_TABLET | Freq: Two times a day (BID) | ORAL | Status: DC | PRN

## 2012-10-06 MED ORDER — PANTOPRAZOLE SODIUM 40 MG PO TBEC
40.0000 mg | DELAYED_RELEASE_TABLET | Freq: Every day | ORAL | Status: DC
  Administered 2012-10-07 – 2012-10-09 (×3): 40 mg via ORAL
  Filled 2012-10-06 (×3): qty 1

## 2012-10-06 MED ORDER — ONDANSETRON HCL 4 MG/2ML IJ SOLN: 4.0000 mg | Freq: Four times a day (QID) | INTRAMUSCULAR | Status: DC | PRN

## 2012-10-06 MED ORDER — ACETAMINOPHEN 325 MG PO TABS: 650.0000 mg | ORAL_TABLET | ORAL | Status: DC | PRN

## 2012-10-06 MED ORDER — TAMSULOSIN HCL 0.4 MG PO CAPS
0.4000 mg | ORAL_CAPSULE | Freq: Every day | ORAL | Status: DC
  Administered 2012-10-06 – 2012-10-08 (×3): 0.4 mg via ORAL
  Filled 2012-10-06 (×4): qty 1

## 2012-10-06 MED ORDER — DOCUSATE SODIUM 100 MG PO TABS: 100.0000 mg | ORAL_TABLET | Freq: Every day | ORAL | Status: DC

## 2012-10-06 MED ORDER — NITROGLYCERIN IN D5W 200-5 MCG/ML-% IV SOLN
INTRAVENOUS | Status: AC
  Administered 2012-10-06: 5 ug/min via INTRAVENOUS
  Filled 2012-10-06: qty 250

## 2012-10-06 MED ORDER — MORPHINE SULFATE 2 MG/ML IJ SOLN
INTRAMUSCULAR | Status: AC
  Administered 2012-10-06: 2 mg via INTRAVENOUS
  Filled 2012-10-06: qty 1

## 2012-10-06 MED ORDER — NITROGLYCERIN IN D5W 200-5 MCG/ML-% IV SOLN: 5.0000 ug/min | Freq: Once | INTRAVENOUS | Status: AC

## 2012-10-06 MED ORDER — TICAGRELOR 90 MG PO TABS
90.0000 mg | ORAL_TABLET | Freq: Two times a day (BID) | ORAL | Status: DC
  Administered 2012-10-07 – 2012-10-09 (×5): 90 mg via ORAL
  Filled 2012-10-06 (×6): qty 1

## 2012-10-06 MED ORDER — HEPARIN (PORCINE) IN NACL 2-0.9 UNIT/ML-% IJ SOLN
INTRAMUSCULAR | Status: AC
  Filled 2012-10-06: qty 1000

## 2012-10-06 MED ORDER — MIDAZOLAM HCL 2 MG/2ML IJ SOLN
INTRAMUSCULAR | Status: AC
  Filled 2012-10-06: qty 2

## 2012-10-06 MED ORDER — ZOLPIDEM TARTRATE 5 MG PO TABS: 5.0000 mg | ORAL_TABLET | Freq: Every evening | ORAL | Status: DC | PRN

## 2012-10-06 MED ORDER — SODIUM CHLORIDE 0.9 % IJ SOLN: 3.0000 mL | INTRAMUSCULAR | Status: DC | PRN

## 2012-10-06 MED ORDER — FAMOTIDINE 20 MG PO TABS
20.0000 mg | ORAL_TABLET | Freq: Two times a day (BID) | ORAL | Status: DC
  Administered 2012-10-06 – 2012-10-09 (×6): 20 mg via ORAL
  Filled 2012-10-06 (×7): qty 1

## 2012-10-06 MED ORDER — ATROPINE SULFATE 1 MG/ML IJ SOLN
INTRAMUSCULAR | Status: AC
  Filled 2012-10-06: qty 1

## 2012-10-06 MED ORDER — ISOSORBIDE MONONITRATE 15 MG HALF TABLET
15.0000 mg | ORAL_TABLET | Freq: Every day | ORAL | Status: DC
  Administered 2012-10-07 – 2012-10-08 (×2): 15 mg via ORAL
  Filled 2012-10-06 (×3): qty 1

## 2012-10-06 MED ORDER — TICAGRELOR 90 MG PO TABS
90.0000 mg | ORAL_TABLET | Freq: Two times a day (BID) | ORAL | Status: DC
  Filled 2012-10-06: qty 1

## 2012-10-06 MED ORDER — TICAGRELOR 90 MG PO TABS
ORAL_TABLET | ORAL | Status: AC
  Filled 2012-10-06: qty 2

## 2012-10-06 MED ORDER — MORPHINE SULFATE 2 MG/ML IJ SOLN: 2.0000 mg | Freq: Once | INTRAMUSCULAR | Status: AC

## 2012-10-06 NOTE — CV Procedure (Signed)
Cardiac Catheterization Operative Report  Jeffrey [name] [id number] 3/26/20145:36 PM [name], MD  Procedure Performed:  1. Left Heart Catheterization 2. Selective Coronary Angiography 3. SVG angiography 4. LIMA graft angiography 5. Left ventricular pressures 6. PTCA/DES x 1 proximal body of SVG to Ramus Intermediate  Operator: Verne Carrow, MD  Indication:  79 yo male with history of complex multi-vessel CAD with prior CABG who is known to have 2 patent bypass grafts with most recent cath 12/13 with another DES placed in proximal body of SVG to Intermediate (total 2 stents proximal body of SVG to Int) who presented to [facility] today with c/o chest pain and was found on EKG to have ST elevation in the inferior leads. Code STEMI activated at [facility] ED.                                  Procedure Details: The risks, benefits, complications, treatment options, and expected outcomes were discussed with the patient. Emergency consent obtained. The patient was brought to the cath lab via EMS. The patient was further sedated with Versed and Fentanyl. The right groin was prepped and draped in the usual manner. Using the modified Seldinger access technique, a 6 French sheath was placed in the right femoral artery. Standard diagnostic catheters were used to perform selective coronary angiography. Both vein grafts and the LIMA graft were engaged with the JR4 catheter. A pigtail catheter was used to measure LV pressures.   He was found to have severe stenosis in the stented segment of the SVG to the Intermediate branch in the proximal body of the graft. This as in the distal portion of the proximal stent that only has one layer of stents. He was given a bolus of Angiomax and a drip was started. He was given 180 mg po Brilinta (on chronically but out since yesterday). The vein graft was engaged with an AL-1 guiding catheter. I then passed a BMW wire down the SVG into the  intermediate branch. I did not use distal protection as the disease was all within the old stent. A 2.5 x 12 mm balloon was inflated twice within the old stent in the area of severe stenosis. I then deployed a 3.0 x 23 mm Xience Xpedition DES in the proximal body of the vein graft to the intermediate. I did not cover the most proximal portion of the previously stented segment extending back to the ostium. The stent was post-dilated with a 3.25 x 15 mm Nowthen balloon x 2. I then used this same balloon to dilate the most proximal stented segment back to the ostium. The final result was excellent. The stenosis was taken from 99% down to 0%. There was excellent flow into the distal graft and intermediate.  There were no immediate complications. The patient was taken to the recovery area in stable condition.   Hemodynamic Findings: Central aortic pressure: 135/69 Left ventricular pressure: 131/2/9  Angiographic Findings:  Left main: Diffuse 20-30% stenosis.   Left Anterior Descending Artery: Large caliber vessel that courses to the apex. Ostial 95% stenosis. 100% mid occlusion. There are two small caliber diagonal branches that are patent with diffuse disease. There is a large septal perforating network that supplies the left to right collaterals. The mid and distal LAD fills from the patent IMA graft.   Circumflex Artery: Moderate caliber vessel. 80% proximal stenosis. 100% mid occlusion.   Right Coronary  Artery: Moderate caliber dominant vessel with 100% proximal occlusion. The distal vessel fills from left to right collaterals. A small caliber marginal branch fills from right to right bridging collaterals.   Graft Anatomy:  SVG to PDA is occluded SVG sequential to OM and intermediate. The limb to the OM is known to be occluded. The limb to the intermediate has extensive proximal body stenting with 99% in-stent restenosis. The remainder of the body of the graft is patent.  LIMA to mid LAD is patent.    Left Ventricular Angiogram: Deferred.   Impression: 1. Severe triple vessel CAD s/p 4V CABG with 2/4 patent bypass grafts. 2. Severe in-stent restenosis proximal body of SVG to intermediate branch 3. Acute inferolateral STEMI. 4. Successful PTCA/DES x 1 proximal body of SVG to intermediate branch  Recommendations: Continue medical therapy with ASA/Brilinta/beta blocker/statin. Will admit to CCU.        Complications:  None. The patient tolerated the procedure well.

## 2012-10-06 NOTE — Progress Notes (Signed)
Chaplain responded to Code STEMI and found pt already being prepped for procedure. No family present. Will pass on to oncall chaplain  Rutherford Nail Chaplain

## 2012-10-06 NOTE — ED Provider Notes (Signed)
History     This chart was scribed for Jeffrey Hutching, MD, MD by Jeffrey [name], ED Scribe. The patient was seen in room APA19/APA19 and the patient's care was started at 3:31 PM.    CSN: [id number]  Arrival date & time 10/06/12  1523      Chief Complaint  Patient presents with  . Chest Pain   Level V caveat for urgent need for intervention  The history is provided by the patient and medical records. No language interpreter was used.   Jeffrey [name] is a 79 y.o. male with hx of DM, CVA, ASCVD, CABG, MI (2013) who presents to the Emergency Department complaining of constant, aching, moderate sternal chest pain onset today 3 hours ago. Pain is rated at 8/10. He states that he took TUMs and asa today without relief of chest pain. Pt reports that he has SOB at baseline. Pt denies diaphoresis, fever, chills, nausea, vomiting, diarrhea, weakness in extremities, numbness in extremities, cough and any other pain.  Cardiologist is Dr. Dietrich Pates  PCP is Dr. Sherwood Gambler     Past Medical History  Diagnosis Date  . Diabetes mellitus, type II   . Hyperlipidemia     Lipid profile in 02/2012:135, 227, 41, 49  . Arteriosclerotic cardiovascular disease (ASCVD)     a. CABG x 4 in 1989 (VG->OM1->OM2, VG->RCA, LIMA->LAD), b. 05/2010: DES to VG-OM1/OM2, DES to distal LCx. c. NSTEMI in 04/2011 - TO distal LCX stent and VG->OM2. d. 02/2012 NSTEMI DES to VG-OM1/continuation to OM2 occluded. e. inferior STEMI s/p DES to [facility] 06/2012.  Marland Kitchen Hypertension   . Osteoarthritis   . CVA (cerebral infarction)     details unclear. pt reports light stroke last year involving L leg. No imaging to suggest hemorrhagic etiology  . Pneumonia   . Cervical vertebral fracture   . Chronic back pain   . Benign prostatic hypertrophy     Required urinary catheter x4 weeks for retention  . Peptic ulcer disease   . Gastroesophageal reflux disease   . Ischemic cardiomyopathy     EF 40% 06/2012 (previously 50-55% 03/2012)  . DM  (diabetes mellitus)     Past Surgical History  Procedure Laterality Date  . Coronary angioplasty with stent placement      2011, 02/2012  . Tonsillectomy    . Coronary artery bypass graft      Family History  Problem Relation Age of Onset  . Early death      Parents died young, unclear reasons    History  Substance Use Topics  . Smoking status: Former Smoker -- 2.00 packs/day for 10 years    Types: Cigarettes    Quit date: 07/14/1961  . Smokeless tobacco: Current User    Types: Chew  . Alcohol Use: No      Review of Systems  Unable to perform ROS: Acuity of condition   10 Systems reviewed and all are negative for acute change except as noted in the HPI.   Allergies  Review of patient's allergies indicates no known allergies.  Home Medications   Current Outpatient Rx  Name  Route  Sig  Dispense  Refill  . albuterol (PROVENTIL HFA;VENTOLIN HFA) 108 (90 BASE) MCG/ACT inhaler   Inhalation   Inhale 2 puffs into the lungs every 6 (six) hours as needed. For shortness of breath         . aspirin 81 MG chewable tablet   Oral   Chew 1 tablet (81 mg total) by  mouth daily.         Marland Kitchen atorvastatin (LIPITOR) 80 MG tablet   Oral   Take 1 tablet (80 mg total) by mouth daily at 6 PM.   30 tablet   3   . B Complex-Biotin-FA (B-COMPLEX PO)   Oral   Take 1 tablet by mouth daily.          . calcium carbonate (TUMS - DOSED IN MG ELEMENTAL CALCIUM) 500 MG chewable tablet   Oral   Chew 1 tablet by mouth 4 (four) times daily - after meals and at bedtime.         . carvedilol (COREG) 3.125 MG tablet   Oral   Take 1 tablet (3.125 mg total) by mouth 2 (two) times daily with a meal.   60 tablet   6   . Docusate Sodium (STOOL SOFTENER) 100 MG capsule   Oral   Take 100 mg by mouth at bedtime. Constipation         . fish oil-omega-3 fatty acids 1000 MG capsule   Oral   Take 1 g by mouth daily.         . isosorbide mononitrate (IMDUR) 30 MG 24 hr tablet    Oral   Take 0.5 tablets (15 mg total) by mouth daily.   30 tablet   6     Keep 2-27 appointment   . metFORMIN (GLUCOPHAGE) 500 MG tablet   Oral   Take 500 mg by mouth 2 (two) times daily.         . Multiple Vitamin (MULTIVITAMIN WITH MINERALS) TABS   Oral   Take 1 tablet by mouth daily.         . nitrofurantoin (MACRODANTIN) 100 MG capsule   Oral   Take 100 mg by mouth at bedtime.         . nitroGLYCERIN (NITROSTAT) 0.4 MG SL tablet   Sublingual   Place 1 tablet (0.4 mg total) under the tongue every 5 (five) minutes x 3 doses as needed for chest pain.   25 tablet   3   . pantoprazole (PROTONIX) 40 MG tablet   Oral   Take 40 mg by mouth daily.          . potassium chloride (K-DUR,KLOR-CON) 10 MEQ tablet   Oral   Take 1 tablet (10 mEq total) by mouth daily.   30 tablet   3   . ramipril (ALTACE) 2.5 MG capsule   Oral   Take 1 capsule (2.5 mg total) by mouth daily.   30 capsule   0   . ranitidine (ZANTAC) 150 MG tablet   Oral   Take 1 tablet (150 mg total) by mouth 2 (two) times daily.   10 tablet   0   . saxagliptin HCl (ONGLYZA) 5 MG TABS tablet   Oral   Take 5 mg by mouth daily.          . Tamsulosin HCl (FLOMAX) 0.4 MG CAPS   Oral   Take 0.4 mg by mouth at bedtime. For urinary health         . Ticagrelor (BRILINTA) 90 MG TABS tablet   Oral   Take 1 tablet (90 mg total) by mouth 2 (two) times daily.   60 tablet   6     BP 134/79  Pulse 90  Temp(Src) 97.4 F (36.3 C)  Resp 18  Ht 5\' 10"  (1.778 m)  Wt 175 lb (79.379 kg)  BMI  25.11 kg/m2  SpO2 98%  Physical Exam  Nursing note and vitals reviewed. Constitutional: He is oriented to person, place, and time. He appears well-developed and well-nourished.  HENT:  Head: Normocephalic and atraumatic.  Eyes: Conjunctivae and EOM are normal. Pupils are equal, round, and reactive to light.  Neck: Normal range of motion. Neck supple.  Cardiovascular: Normal rate, regular rhythm and normal  heart sounds.   Pulmonary/Chest: Effort normal and breath sounds normal.  Abdominal: Soft. Bowel sounds are normal.  Musculoskeletal: Normal range of motion.  Neurological: He is alert and oriented to person, place, and time.  Skin: Skin is warm and dry.  Psychiatric: He has a normal mood and affect.    ED Course  Procedures (including critical care time) DIAGNOSTIC STUDIES: Oxygen Saturation is 98% on room air, normal by my interpretation.    COORDINATION OF CARE: 3:37 PM Discussed ED treatment with pt and pt agrees.  CRITICAL CARE Performed by: Lorna Few MD   Total critical care time: 30  Critical care time was exclusive of separately billable procedures and treating other patients.  Critical care was necessary to treat or prevent imminent or life-threatening deterioration.  Critical care was time spent personally by me on the following activities: development of treatment plan with patient and/or surrogate as well as nursing, discussions with consultants, evaluation of patient's response to treatment, examination of patient, obtaining history from patient or surrogate, ordering and performing treatments and interventions, ordering and review of laboratory studies, ordering and review of radiographic studies, pulse oximetry and re-evaluation of patient's condition.   Date: 10/06/2012  Rate: 92  Rhythm: normal sinus rhythm  QRS Axis: normal  Intervals: normal  ST/T Wave abnormalities: ST elevations inferiorly  Conduction Disutrbances:none  Narrative Interpretation:   Old EKG Reviewed: changes noted Results for orders placed during the hospital encounter of 09/29/12  URINALYSIS, ROUTINE W REFLEX MICROSCOPIC      Result Value Range   Color, Urine YELLOW  YELLOW   APPearance CLEAR  CLEAR   Specific Gravity, Urine 1.020  1.005 - 1.030   pH 8.5 (*) 5.0 - 8.0   Glucose, UA NEGATIVE  NEGATIVE mg/dL   Hgb urine dipstick NEGATIVE  NEGATIVE   Bilirubin Urine NEGATIVE  NEGATIVE    Ketones, ur NEGATIVE  NEGATIVE mg/dL   Protein, ur TRACE (*) NEGATIVE mg/dL   Urobilinogen, UA 0.2  0.0 - 1.0 mg/dL   Nitrite NEGATIVE  NEGATIVE   Leukocytes, UA NEGATIVE  NEGATIVE  BASIC METABOLIC PANEL      Result Value Range   Sodium 134 (*) 135 - 145 mEq/L   Potassium 4.4  3.5 - 5.1 mEq/L   Chloride 99  96 - 112 mEq/L   CO2 23  19 - 32 mEq/L   Glucose, Bld 229 (*) 70 - 99 mg/dL   BUN 12  6 - 23 mg/dL   Creatinine, Ser 1.61  0.50 - 1.35 mg/dL   Calcium 9.4  8.4 - 09.6 mg/dL   GFR calc non Af Amer 78 (*) >90 mL/min   GFR calc Af Amer >90  >90 mL/min  CBC WITH DIFFERENTIAL      Result Value Range   WBC 7.7  4.0 - 10.5 K/uL   RBC 3.94 (*) 4.22 - 5.81 MIL/uL   Hemoglobin 11.3 (*) 13.0 - 17.0 g/dL   HCT 04.5 (*) 40.9 - 81.1 %   MCV 86.8  78.0 - 100.0 fL   MCH 28.7  26.0 - 34.0 pg  MCHC 33.0  30.0 - 36.0 g/dL   RDW 40.9  81.1 - 91.4 %   Platelets 186  150 - 400 K/uL   Neutrophils Relative 68  43 - 77 %   Neutro Abs 5.3  1.7 - 7.7 K/uL   Lymphocytes Relative 17  12 - 46 %   Lymphs Abs 1.3  0.7 - 4.0 K/uL   Monocytes Relative 10  3 - 12 %   Monocytes Absolute 0.8  0.1 - 1.0 K/uL   Eosinophils Relative 4  0 - 5 %   Eosinophils Absolute 0.3  0.0 - 0.7 K/uL   Basophils Relative 0  0 - 1 %   Basophils Absolute 0.0  0.0 - 0.1 K/uL  TROPONIN I      Result Value Range   Troponin I <0.30  <0.30 ng/mL  URINE MICROSCOPIC-ADD ON      Result Value Range   WBC, UA 0-2  <3 WBC/hpf   RBC / HPF 0-2  <3 RBC/hpf  TROPONIN I      Result Value Range   Troponin I <0.30  <0.30 ng/mL    Labs Reviewed - No data to display No results found.   No diagnosis found.  CRITICAL CARE Performed by: Jeffrey [name]   Total critical care time: 30  Critical care time was exclusive of separately billable procedures and treating other patients.  Critical care was necessary to treat or prevent imminent or life-threatening deterioration.  Critical care was time spent personally by me on the  following activities: development of treatment plan with patient and/or surrogate as well as nursing, discussions with consultants, evaluation of patient's response to treatment, examination of patient, obtaining history from patient or surrogate, ordering and performing treatments and interventions, ordering and review of laboratory studies, ordering and review of radiographic studies, pulse oximetry and re-evaluation of patient's condition.  MDM  Central chest pain since noon today unrelieved by Tums.  EKG shows ST elevation in III and F.  Rx aspirin, IV morphine, IV nitroglycerin.   Discussed with Dr. Clifton Yacoub.  Will accept in transfer        Jeffrey Hutching, MD 10/06/12 1601

## 2012-10-06 NOTE — ED Notes (Signed)
Chest pain onset today, took Tums without relief

## 2012-10-06 NOTE — H&P (Signed)
History and Physical   Patient ID: Jeffrey [name] MRN: [id number], DOB/AGE: 1928-08-03 79 y.o. Date of Encounter: 10/06/2012  Primary Physician: [name], MD Primary Cardiologist: RR  Chief Complaint:  STEMI  HPI: Jeffrey [name] is a 79 y.o. male with a history of CAD. He went to [facility] ER at approximately 3:45 PM for chest pain. His ECG was consistent with an Inferior STEMI. He was transferred emergently to [facility].  His pain started at home today after lunch, time otherwise unclear. He had SOB with the pain, but no N&V or diaphoresis. At [facility] ER, he received ASA 325 mg, IV NTG and morphine. His pain decreased to a 2/10 but his ECG was still significantly abnormal with inferior ST elevation. During transfer, he remained stable and was pain-free upon arrival. However, with his abnormal ECG, he was taken to the cath lab. He reports several recent episodes of the same pain, but those were relieved by Tums or other OTC GI med.     Past Medical History  Diagnosis Date  . Diabetes mellitus, type II   . Hyperlipidemia     Lipid profile in 02/2012:135, 227, 41, 49  . Arteriosclerotic cardiovascular disease (ASCVD)     a. CABG x 4 in 1989 (VG->OM1->OM2, VG->RCA, LIMA->LAD), b. 05/2010: DES to VG-OM1/OM2, DES to distal LCx. c. NSTEMI in 04/2011 - TO distal LCX stent and VG->OM2. d. 02/2012 NSTEMI DES to VG-OM1/continuation to OM2 occluded. e. inferior STEMI s/p DES to [facility] 06/2012.  Marland Kitchen Hypertension   . Osteoarthritis   . CVA (cerebral infarction)     details unclear. pt reports light stroke last year involving L leg. No imaging to suggest hemorrhagic etiology  . Pneumonia   . Cervical vertebral fracture   . Chronic back pain   . Benign prostatic hypertrophy     Required urinary catheter x4 weeks for retention  . Peptic ulcer disease   . Gastroesophageal reflux disease   . Ischemic cardiomyopathy     EF 40% 06/2012 (previously 50-55% 03/2012)  . DM (diabetes mellitus)       Surgical History:  Past Surgical History  Procedure Laterality Date  . Coronary angioplasty with stent placement      2011, 02/2012  . Tonsillectomy    . Coronary artery bypass graft       I have reviewed the patient's current medications. Medication Sig  albuterol (PROVENTIL HFA;VENTOLIN HFA) 108 (90 BASE) MCG/ACT inhaler Inhale 2 puffs into the lungs every 6 (six) hours as needed. For shortness of breath  aspirin 81 MG chewable tablet Chew 1 tablet (81 mg total) by mouth daily.  atorvastatin (LIPITOR) 80 MG tablet Take 1 tablet (80 mg total) by mouth daily at 6 PM.  B Complex-Biotin-FA (B-COMPLEX PO) Take 1 tablet by mouth daily.   calcium carbonate (TUMS - DOSED IN MG ELEMENTAL CALCIUM) 500 MG chewable tablet Chew 1 tablet by mouth 4 (four) times daily - after meals and at bedtime.  carvedilol (COREG) 3.125 MG tablet Take 1 tablet (3.125 mg total) by mouth 2 (two) times daily with a meal.  Docusate Sodium (STOOL SOFTENER) 100 MG capsule Take 100 mg by mouth at bedtime. Constipation  fish oil-omega-3 fatty acids 1000 MG capsule Take 1 g by mouth daily.  isosorbide mononitrate (IMDUR) 30 MG 24 hr tablet Take 0.5 tablets (15 mg total) by mouth daily.  metFORMIN (GLUCOPHAGE) 500 MG tablet Take 500 mg by mouth 2 (two) times daily.  Multiple Vitamin (MULTIVITAMIN WITH  MINERALS) TABS Take 1 tablet by mouth daily.  nitrofurantoin (MACRODANTIN) 100 MG capsule Take 100 mg by mouth at bedtime.  nitroGLYCERIN (NITROSTAT) 0.4 MG SL tablet Place 1 tablet (0.4 mg total) under the tongue every 5 (five) minutes x 3 doses as needed for chest pain.  pantoprazole (PROTONIX) 40 MG tablet Take 40 mg by mouth daily.   potassium chloride (K-DUR,KLOR-CON) 10 MEQ tablet Take 1 tablet (10 mEq total) by mouth daily.  ramipril (ALTACE) 2.5 MG capsule Take 1 capsule (2.5 mg total) by mouth daily.  ranitidine (ZANTAC) 150 MG tablet Take 1 tablet (150 mg total) by mouth 2 (two) times daily.  saxagliptin HCl  (ONGLYZA) 5 MG TABS tablet Take 5 mg by mouth daily.   Tamsulosin HCl (FLOMAX) 0.4 MG CAPS Take 0.4 mg by mouth at bedtime. For urinary health  Ticagrelor (BRILINTA) 90 MG TABS tablet Take 1 tablet (90 mg total) by mouth 2 (two) times daily.    Allergies: No Known Allergies  History   Social History  . Marital Status: Divorced    Spouse Name: N/A    Number of Children: N/A  . Years of Education: N/A   Occupational History  . Retired     Probation officer   Social History Main Topics  . Smoking status: Former Smoker -- 2.00 packs/day for 10 years    Types: Cigarettes    Quit date: 07/14/1961  . Smokeless tobacco: Current User    Types: Chew  . Alcohol Use: No  . Drug Use: No  . Sexually Active: No   Other Topics Concern  . Not on file   Social History Narrative   ** Merged History Encounter **...   He was raised by his uncle.  His mother deceased when   he was 3 year old with appendicitis. Father deceased from an MI at age   38.  He has 1 sister, but he does not know her health status as they   have been separated.           Family History  Problem Relation Age of Onset  . Early death      Parents died young  . Appendicitis Mother     Pt was 75 year old  . Heart attack Father 42   Family Status  Relation Status Death Age  . Mother Deceased   . Father Deceased   . Sister Alive     unsure if she has CAD   Review of Systems:   Full 14-point review of systems otherwise negative except as noted above.  Physical Exam: Blood pressure 136/73, pulse 87, temperature 97.4 F (36.3 C), resp. rate 18, height 5\' 10"  (1.778 m), weight 175 lb (79.379 kg), SpO2 98.00%. General: Well developed, well nourished,male in mild distress. Head: Normocephalic, atraumatic, sclera non-icteric, no xanthomas, nares are without discharge. Dentition: poor Neck: No carotid bruits. JVD not elevated. No thyromegally Lungs: Good expansion bilaterally. without wheezes or rhonchi.  Heart: Regular rate  and rhythm with S1 S2.  No S3 or S4.  No murmur, no rubs, or gallops appreciated. Abdomen: Soft, non-tender, non-distended with normoactive bowel sounds. No hepatomegaly. No rebound/guarding. No obvious abdominal masses. Foley catheter in place with leg bag. Msk:  Strength and tone appear normal for age. No joint deformities or effusions, no spine or costo-vertebral angle tenderness. Extremities: No clubbing or cyanosis. No edema.  Distal pedal pulses are 2+ in all 4 extrem Neuro: Alert and oriented X 3. Moves all extremities spontaneously. No  focal deficits noted. Psych:  Responds to questions appropriately with a normal affect. Skin: No rashes or lesions noted  Labs:   Lab Results  Component Value Date   WBC 7.7 09/29/2012   HGB 12.2* 10/06/2012   HCT 36.0* 10/06/2012   MCV 86.8 09/29/2012   PLT 186 09/29/2012   No results found for this basename: INR,  in the last 72 hours   Recent Labs Lab 10/06/12 1545  NA 138  K 4.3  CL 105  BUN 16  CREATININE 1.00  GLUCOSE 210*     Recent Labs  10/06/12 1540  TROPIPOC 0.02   ECG:  SR, Inferior ST elevation  ASSESSMENT AND PLAN:  Principal Problem:   ST elevation myocardial infarction (STEMI) of inferolateral wall, initial episode of care - Pt for emergent cath, further evaluation and treatment based on results.   Otherwise, continue home meds and screen for CRF control. Active Problems:   BPH (benign prostatic hyperplasia)   Hyperlipidemia   Diabetes mellitus, type II   Hypertension   Signed, Jeffrey [name] 10/06/2012, 4:46 PM   I have personally seen and examined this patient with Jeffrey Demark, PA-C I agree with the assessment and plan as outlined above. Inferolateral STEMI. Emergent cath.   Jeffrey [name] 6:05 PM 10/06/2012

## 2012-10-07 ENCOUNTER — Encounter: Payer: Self-pay | Admitting: Cardiology

## 2012-10-07 DIAGNOSIS — I7090 Unspecified atherosclerosis: Secondary | ICD-10-CM

## 2012-10-07 DIAGNOSIS — I10 Essential (primary) hypertension: Secondary | ICD-10-CM

## 2012-10-07 DIAGNOSIS — I2510 Atherosclerotic heart disease of native coronary artery without angina pectoris: Secondary | ICD-10-CM

## 2012-10-07 DIAGNOSIS — I517 Cardiomegaly: Secondary | ICD-10-CM

## 2012-10-07 LAB — LIPID PANEL
Cholesterol: 124 mg/dL (ref 0–200)
Triglycerides: 211 mg/dL — ABNORMAL HIGH (ref <150)
VLDL: 42 mg/dL — ABNORMAL HIGH (ref 0–40)

## 2012-10-07 LAB — GLUCOSE, CAPILLARY: Glucose-Capillary: 227 mg/dL — ABNORMAL HIGH (ref 70–99)

## 2012-10-07 LAB — COMPREHENSIVE METABOLIC PANEL
BUN: 11 mg/dL (ref 6–23)
CO2: 27 mEq/L (ref 19–32)
Calcium: 9.2 mg/dL (ref 8.4–10.5)
Creatinine, Ser: 0.89 mg/dL (ref 0.50–1.35)
GFR calc Af Amer: >90 mL/min (ref >90)
GFR calc non Af Amer: 78 mL/min — ABNORMAL LOW (ref >90)
Glucose, Bld: 228 mg/dL — ABNORMAL HIGH (ref 70–99)
Total Protein: 6.4 g/dL (ref 6.0–8.3)

## 2012-10-07 LAB — CBC
Hemoglobin: 11.3 g/dL — ABNORMAL LOW (ref 13.0–17.0)
MCH: 28.5 pg (ref 26.0–34.0)
MCHC: 32.6 g/dL (ref 30.0–36.0)

## 2012-10-07 LAB — TROPONIN I: Troponin I: 2.68 ng/mL (ref <0.30)

## 2012-10-07 MED ORDER — INSULIN ASPART 100 UNIT/ML ~~LOC~~ SOLN
0.0000 [IU] | Freq: Three times a day (TID) | SUBCUTANEOUS | Status: DC
  Administered 2012-10-07: 5 [IU] via SUBCUTANEOUS
  Administered 2012-10-08: 3 [IU] via SUBCUTANEOUS
  Administered 2012-10-08: 5 [IU] via SUBCUTANEOUS
  Administered 2012-10-08: 3 [IU] via SUBCUTANEOUS
  Administered 2012-10-09 (×2): 5 [IU] via SUBCUTANEOUS

## 2012-10-07 MED ORDER — MAGNESIUM HYDROXIDE 400 MG/5ML PO SUSP: 30.0000 mL | Freq: Every day | ORAL | Status: DC | PRN

## 2012-10-07 MED FILL — Dextrose Inj 5%: INTRAVENOUS | Qty: 50 | Status: AC

## 2012-10-07 NOTE — Progress Notes (Signed)
Echocardiogram 2D Echocardiogram has been performed.  Amerika Nourse 10/07/2012, 12:00 PM

## 2012-10-07 NOTE — Care Management Note (Addendum)
    Page 1 of 2   10/08/2012     2:22:12 PM   CARE MANAGEMENT NOTE 10/08/2012  Patient:  Jeffrey [name], Jeffrey [name]   Account Number:  [id number]  Date Initiated:  10/07/2012  Documentation initiated by:  Junius Creamer  Subjective/Objective Assessment:   adm w mi     Action/Plan:   lives alone, pcp dr Lyman Bishop fusco   Anticipated DC Date:  10/08/2012   Anticipated DC Plan:  HOME W HOME HEALTH SERVICES      DC Planning Services  CM consult  Medication Assistance      [facility] Choice  HOME HEALTH   Choice offered to / List presented to:  C-1 Patient        HH arranged  HH-1 RN  HH-4 NURSE'S AIDE  HH-6 SOCIAL WORKER      HH agency  [facility]   Status of service:  Completed, signed off Medicare Important Message given?   (If response is "NO", the following Medicare IM given date fields will be blank) Date Medicare IM given:   Date Additional Medicare IM given:    Discharge Disposition:  HOME W HOME HEALTH SERVICES  Per UR Regulation:  Reviewed for med. necessity/level of care/duration of stay  If discussed at Long Length of Stay Meetings, dates discussed:    Comments:  10/08/12 Rosalita Chessman [phone] PT FOR DC HOME TODAY.  NOTIFIED [facility] OF DC DATE.  3/27 1003 debbie dowell rn,bsn spoke w pt. he has 6.50 copay for brilinta but he did not pick up on 3/26 then had mi. he has problems w meds. he has medicare and supplemnt that covers meds. hx of medicaid. have asked [facility] for sw to see if pt can get mediciad again but he doesn't understand process. he uses [facility] for meds and they have brilinta in stock. he has 30day free card and copay assist card but he only has 6.50 copay per pt.

## 2012-10-07 NOTE — Plan of Care (Addendum)
Problem: Phase I Progression Outcomes Goal: pt has foley from home  Outcome: Not Applicable Date Met:  10/07/12 Pt has chronic catheter.  Needs turp

## 2012-10-07 NOTE — Progress Notes (Addendum)
    SUBJECTIVE: No chest pain this am.   BP 97/81  Pulse 81  Temp(Src) 98.1 F (36.7 C)  Resp 20  Ht 5\' 10"  (1.778 m)  Wt 174 lb 13.2 oz (79.3 kg)  BMI 25.08 kg/m2  SpO2 98%  Intake/Output Summary (Last 24 hours) at 10/07/12 0737 Last data filed at 10/07/12 0400  Gross per 24 hour  Intake    775 ml  Output   1700 ml  Net   -925 ml    PHYSICAL EXAM General: Well developed, well nourished, in no acute distress. Alert and oriented x 3.  Psych:  Good affect, responds appropriately Neck: No JVD. No masses noted.  Lungs: Clear bilaterally with no wheezes or rhonci noted.  Heart: RRR  Abdomen: Bowel sounds are present. Soft, non-tender.  Extremities: No lower extremity edema. Right groin stable, small hematoma. Soft.   LABS: Basic Metabolic Panel:  Recent Labs  78/29/56 1545  NA 138  K 4.3  CL 105  GLUCOSE 210*  BUN 16  CREATININE 1.00   CBC:  Recent Labs  10/06/12 1545  HGB 12.2*  HCT 36.0*   Cardiac Enzymes:  Recent Labs  10/06/12 1910 10/07/12 0107  TROPONINI 1.70* 2.32*   Fasting Lipid Panel: No results found for this basename: CHOL, HDL, LDLCALC, TRIG, CHOLHDL, LDLDIRECT,  in the last 72 hours  Current Meds: . aspirin  81 mg Oral Daily  . atorvastatin  80 mg Oral q1800  . B-complex with vitamin C  1 tablet Oral Daily  . calcium carbonate  1 tablet Oral TID PC & HS  . carvedilol  3.125 mg Oral BID WC  . docusate sodium  100 mg Oral Daily  . famotidine  20 mg Oral BID  . isosorbide mononitrate  15 mg Oral Daily  . linagliptin  5 mg Oral Daily  . multivitamin with minerals  1 tablet Oral Daily  . nitrofurantoin  100 mg Oral QHS  . omega-3 acid ethyl esters  1 g Oral Daily  . pantoprazole  40 mg Oral Q1200  . potassium chloride  10 mEq Oral Daily  . ramipril  2.5 mg Oral Daily  . sodium chloride  3 mL Intravenous Q12H  . tamsulosin  0.4 mg Oral QHS  . Ticagrelor  90 mg Oral BID     ASSESSMENT AND PLAN: 79 yo male with history of complex  CAD s/p 4 V CABG, HTN, HLD, DM, CVA, ischemic CM admitted with inferolateral STEMI 10/06/12 and found to have severe restenosis in his vein graft to the intermediate branch. Now s/p DES x 1 SVG to intermediate. LIMA to LAD patent but SVG to OM and SVG to PDA known to be occluded.   1. Inferolateral STEMI:  S/p DES x 1 proximal body of SVG to intermediate. Doing well this am. Will continue ASA and Brilinta for dual anti-platelet therapy. Continue statin, beta blocker, Ace-inh, Imdur. Transfer to telemetry today. Will order echo today to assess LVEF. BMET pending.   2. HTN: BP controlled.   3. HLD: Continue statin  4. DM: SSI  5. BPH: Foley catheter in place from home.   6. Ischemic cardiomyopathy: Volume status ok. Echo today to assess LVEF  7. Dispo: Transfer to telemetry unit today. Will likely need 48 hour hospitalization.   Jeffrey [name],Jeffrey [name]  3/27/20147:37 AM

## 2012-10-07 NOTE — Progress Notes (Signed)
Inpatient Diabetes Program Recommendations  AACE/ADA: New Consensus Statement on Inpatient Glycemic Control (2013)  Target Ranges:  Prepandial:   less than 140 mg/dL      Peak postprandial:   less than 180 mg/dL (1-2 hours)      Critically ill patients:  140 - 180 mg/dL   Inpatient Diabetes Program Recommendations Correction (SSI): Start Novolog sensitive scale TID per Glycemic Control Order set Diet: add carbohydrate modified to diet Thank you  Piedad Climes BSN, RN,CDE Inpatient Diabetes Coordinator [phone] (team pager)

## 2012-10-07 NOTE — Progress Notes (Addendum)
CARDIAC REHAB PHASE I   PRE:  Rate/Rhythm: 78 SR    BP: sitting 132/73    SaO2: 98 Ra  MODE:  Ambulation: 290 ft   POST:  Rate/Rhythm: 89 SR    BP: sitting 115/60     SaO2: 93 RA  Tolerated well. BP lower after walk, denied dizziness. Discussed MI, stent and risk factors with pt. Pt seemingly understands his disease but set in his ways/system of living. Yesterday was frustrated by cost of Brilinta ($6.50) and decided to see if there was a generic before filling rx. Discussed importance of taking Brilinta. Not interested in CRPII, sts he walks almost daily at [employer]. Encouraged more walking today. Sts he does not chew tobacco anymore. [id number]   Elissa Lovett [place] CES, ACSM 10/07/2012 11:05 AM

## 2012-10-07 NOTE — Progress Notes (Signed)
Pt has foley catheter in from home.  With leg bag.  Pt states urologist put it in and he is waiting on prostate resection surgery.  Pt refuses to have foley bag. States he wants to keep his leg bag.

## 2012-10-08 ENCOUNTER — Telehealth: Payer: Self-pay | Admitting: Cardiology

## 2012-10-08 ENCOUNTER — Encounter (HOSPITAL_COMMUNITY): Payer: Self-pay | Admitting: Physician Assistant

## 2012-10-08 ENCOUNTER — Ambulatory Visit: Admit: 2012-10-08 | Payer: Medicare Other | Admitting: Urology

## 2012-10-08 LAB — CBC
HCT: 34.7 % — ABNORMAL LOW (ref 39.0–52.0)
Hemoglobin: 11.5 g/dL — ABNORMAL LOW (ref 13.0–17.0)
RBC: 4.13 MIL/uL — ABNORMAL LOW (ref 4.22–5.81)
WBC: 7.8 10*3/uL (ref 4.0–10.5)

## 2012-10-08 LAB — BASIC METABOLIC PANEL
BUN: 11 mg/dL (ref 6–23)
Chloride: 104 mEq/L (ref 96–112)
GFR calc Af Amer: >90 mL/min (ref >90)
Potassium: 4.1 mEq/L (ref 3.5–5.1)
Sodium: 138 mEq/L (ref 135–145)

## 2012-10-08 LAB — GLUCOSE, CAPILLARY
Glucose-Capillary: 182 mg/dL — ABNORMAL HIGH (ref 70–99)
Glucose-Capillary: 195 mg/dL — ABNORMAL HIGH (ref 70–99)
Glucose-Capillary: 249 mg/dL — ABNORMAL HIGH (ref 70–99)

## 2012-10-08 SURGERY — TURP (TRANSURETHRAL RESECTION OF PROSTATE)
Anesthesia: Choice

## 2012-10-08 MED ORDER — BISACODYL 10 MG RE SUPP
10.0000 mg | Freq: Once | RECTAL | Status: AC | PRN
  Administered 2012-10-08: 10 mg via RECTAL
  Filled 2012-10-08: qty 1

## 2012-10-08 MED ORDER — METFORMIN HCL 500 MG PO TABS: 500.0000 mg | ORAL_TABLET | Freq: Two times a day (BID) | ORAL | Status: DC

## 2012-10-08 NOTE — Progress Notes (Signed)
[facility]  Patient Status: New  [facility] is providing the following services: RN and HHA - note plan for possible discharge on Saturday.  Is on the schedule for an RN visit at home on Monday  If patient discharges after hours, please call [phone].   Jeffrey [name] 10/08/2012, 5:20 PM

## 2012-10-08 NOTE — Progress Notes (Signed)
Ambulated again with pt in hall. Pt feels tired and weak this afternoon. He has some concerns about home care. BP are better now at 126/62. MD may let him stay one more day to make sure he feels able to care for himself at home. Will continue to monitor.

## 2012-10-08 NOTE — Discharge Summary (Signed)
See full note this am. cdm 

## 2012-10-08 NOTE — Telephone Encounter (Signed)
TCM for STEMI / d/c'd 10/08/12. / tgs

## 2012-10-08 NOTE — Progress Notes (Signed)
SUBJECTIVE: No chest pain or SOB.   BP 124/64  Pulse 72  Temp(Src) 98.7 F (37.1 C) (Oral)  Resp 20  Ht 5\' 10"  (1.778 m)  Wt 174 lb 13.2 oz (79.3 kg)  BMI 25.08 kg/m2  SpO2 97%  Intake/Output Summary (Last 24 hours) at 10/08/12 0641 Last data filed at 10/07/12 1200  Gross per 24 hour  Intake    600 ml  Output    400 ml  Net    200 ml    PHYSICAL EXAM General: Well developed, well nourished, in no acute distress. Alert and oriented x 3.  Psych:  Good affect, responds appropriately Neck: No JVD. No masses noted.  Lungs: Clear bilaterally with no wheezes or rhonci noted.  Heart: RRR with no murmurs noted. Abdomen: Bowel sounds are present. Soft, non-tender.  Extremities: No lower extremity edema. Right groin cath site ok. No hematoma.   LABS: Basic Metabolic Panel:  Recent Labs  16/10/96 0700 10/08/12 0505  NA 137 138  K 4.3 4.1  CL 101 104  CO2 27 25  GLUCOSE 228* 199*  BUN 11 11  CREATININE 0.89 0.86  CALCIUM 9.2 9.2   CBC:  Recent Labs  10/07/12 0700 10/08/12 0505  WBC 8.0 7.8  HGB 11.3* 11.5*  HCT 34.7* 34.7*  MCV 87.4 84.0  PLT 166 173   Cardiac Enzymes:  Recent Labs  10/06/12 1910 10/07/12 0107 10/07/12 0700  TROPONINI 1.70* 2.32* 2.68*   Fasting Lipid Panel:  Recent Labs  10/07/12 0700  CHOL 124  HDL 31*  LDLCALC 51  TRIG 045*  CHOLHDL 4.0    Current Meds: . aspirin  81 mg Oral Daily  . atorvastatin  80 mg Oral q1800  . B-complex with vitamin C  1 tablet Oral Daily  . calcium carbonate  1 tablet Oral TID PC & HS  . carvedilol  3.125 mg Oral BID WC  . docusate sodium  100 mg Oral Daily  . famotidine  20 mg Oral BID  . insulin aspart  0-15 Units Subcutaneous TID WC  . isosorbide mononitrate  15 mg Oral Daily  . linagliptin  5 mg Oral Daily  . multivitamin with minerals  1 tablet Oral Daily  . nitrofurantoin  100 mg Oral QHS  . omega-3 acid ethyl esters  1 g Oral Daily  . pantoprazole  40 mg Oral Q1200  . potassium  chloride  10 mEq Oral Daily  . ramipril  2.5 mg Oral Daily  . sodium chloride  3 mL Intravenous Q12H  . tamsulosin  0.4 mg Oral QHS  . Ticagrelor  90 mg Oral BID   Echo 10/07/12: - Left ventricle: The cavity size was normal. Wall thickness was increased in a pattern of mild LVH. Systolic function was mildly reduced. The estimated ejection fraction was in the range of 45% to 50%. Possible mild hypokinesis of the entireinferior myocardium. Doppler parameters are consistent with abnormal left ventricular relaxation (grade 1 diastolic dysfunction). - Mitral valve: Calcified annulus. Mildly thickened leaflets . - Left atrium: The atrium was mildly to moderately dilated. - Right atrium: The atrium was mildly dilated.    ASSESSMENT AND PLAN: 79 yo male with history of complex CAD s/p 4 V CABG, HTN, HLD, DM, CVA, ischemic CM admitted with inferolateral STEMI 10/06/12 and found to have severe restenosis in his vein graft to the intermediate branch. Now s/p DES x 1 SVG to intermediate. LIMA to LAD patent but SVG to OM and  SVG to PDA known to be occluded.   1. Inferolateral STEMI: S/p DES x 1 proximal body of SVG to intermediate. Doing well this am. Will continue ASA and Brilinta for dual anti-platelet therapy. Continue statin, beta blocker, Ace-inh, Imdur. Mild LV dysfunction on echo yesterday.    2. HTN: BP controlled.   3. HLD: Continue statin   4. DM: Resume home regimen  5. BPH: Foley catheter in place from home. Urology f/u as planned before admission  6. Ischemic cardiomyopathy: Volume status ok. LVEF 45-50%  7. Dispo: D/C home today. Follow up with Dr. Dietrich Pates in 2-3 weeks. I would prefer late morning d/c so he can ambulate in the hallways for a few hours.      MCALHANY,CHRISTOPHER  3/28/20146:41 AM

## 2012-10-08 NOTE — Progress Notes (Signed)
CARDIAC REHAB PHASE I   PRE:  Rate/Rhythm: 75 SR    BP: sitting 122/70    SaO2:   MODE:  Ambulation: 500 ft   POST:  Rate/Rhythm: 88 SR    BP: sitting 100/60      SaO2:   Pt has been walking this am independently about 250 ft at a time. Walked with pt longer distance and he needed to rest x3 on return trip due to fatigue. He sts this is normal for him at home too. Bp lower after walk, which seems to be his norm. Anxious to go home. All questions answered. [id number]   Elissa Lovett [place] CES, ACSM 10/08/2012 9:21 AM

## 2012-10-08 NOTE — Progress Notes (Signed)
Pt ate lunch and still feels "kind of weak." He is nervous about going home today since he lives by himself. Tele without arrhythmias. BP low-normal. As per DC summary, we have discontinued Imdur (already received today but will not get this tomorrow). I think it's reasonable in light of his age and STEMI to watch him thru tomorrow morning. He agrees with this plan. Encouraged pt to take it easy this afternoon, try to ambulate again this evening, and we will reassess in the morning to see if he's ready for discharge.  Dayna Dunn PA-C

## 2012-10-08 NOTE — Discharge Summary (Addendum)
Discharge Summary   Patient ID: Jeffrey [name] MRN: [id number], DOB/AGE: 1928-12-04 79 y.o. Admit date: 10/06/2012 D/C date:     10/08/2012  Primary Cardiologist: Jeffrey [name]  Primary Discharge Diagnoses:  1. CAD with inferolateral STEMI this admission - s/p DES x 1 proximal body of SVG to intermediate 10/06/12 - history: a. CABG x 4 in 1989 (VG->OM1->OM2, VG->RCA, LIMA->LAD), b. 05/2010: DES to VG-OM1/OM2, DES to distal LCx. c. NSTEMI in 04/2011 - TO distal LCX stent and VG->OM2. d. 02/2012 NSTEMI DES to VG-OM1/continuation to OM2 occluded. e. inferior STEMI s/p DES to [facility] 06/2012.  2. HTN 3. HLD 4. Diabetes mellitus 5. BPH with foley catheter in place from home 6. Ischemic cardiomyopathy EF 45-50%   Secondary Discharge Diagnoses:  1. Osteoarthritis 2. CVA  -details unclear. pt reports light stroke last year involving L leg. No imaging to suggest hemorrhagic etiology 3. Pneumonia 4. Cervical vertebral fracture 5. Chronic back pain 6. Peptic ulcer disease 7. GERD 8. Diabetes mellitus  Hospital Course: Jeffrey [name] is an 79 y/o M with extensive history of CAD, DM, HTN who presented to [facility] 10/06/12 with chest pain starting that day after lunch. He had SOB with the pain, but no N&V or diaphoresis. In the ED, ECG was consistent with STEMI. He was transferred emergently to the cath lab at [facility] with results below: 1. Severe triple vessel CAD s/p 4V CABG with 2/4 patent bypass grafts.  2. Severe in-stent restenosis proximal body of SVG to intermediate branch  3. Acute inferolateral STEMI.  4. Successful PTCA/DES x 1 proximal body of SVG to intermediate branch Post-PCI, it was recommended to continue therapy with ASA/Brilinta/beta blocker/statin. Peak troponin 2.68. F/u echo showed EF 45-50% with possible mild hypokinesis of the entireinferior myocardium. Today he is doing well. He has ambulated without complication. HHRN and aide are being arranged. He did have some dizziness  associated with borderline pressures  - per discussion with Dr. Clifton [name], we'll hold Imdur at discharge (15mg  daily). The patient was held overnight and monitored. Case manager was contacted to arrange home health RN and aide. Jeffrey [name] was instructed to check BP regularly at home and call if it runs low or has further dizziness. On 10/09/2012, Dr Jeffrey [name] saw and examined the patient and feels he is stable for discharge.   Discharge Vitals: Blood pressure 106/58, pulse 72, temperature 98.7 F (37.1 C), temperature source Oral, resp. rate 20, height 5\' 10"  (1.778 m), weight 171 lb 11.2 oz (77.883 kg), SpO2 97.00%.   Labs: Lab Results  Component Value Date   WBC 7.8 10/08/2012   HGB 11.5* 10/08/2012   HCT 34.7* 10/08/2012   MCV 84.0 10/08/2012   PLT 173 10/08/2012     Recent Labs Lab 10/07/12 0700 10/08/12 0505  NA 137 138  K 4.3 4.1  CL 101 104  CO2 27 25  BUN 11 11  CREATININE 0.89 0.86  CALCIUM 9.2 9.2  PROT 6.4  --   BILITOT 0.2*  --   ALKPHOS 78  --   ALT 19  --   AST 24  --   GLUCOSE 228* 199*    Recent Labs  10/06/12 1910 10/07/12 0107 10/07/12 0700  TROPONINI 1.70* 2.32* 2.68*   Lab Results  Component Value Date   CHOL 124 10/07/2012   HDL 31* 10/07/2012   LDLCALC 51 10/07/2012   TRIG 211* 10/07/2012     Diagnostic Studies/Procedures   1. Cardiac catheterization this admission, please see full report  and above for summary.  2. Study Conclusions- Left ventricle: The cavity size was normal. Wall thickne ss was increased in a pattern of mild LVH. Systolic function was mildly reduced. The estimated ejection fraction was in the range of 45% to 50%. Possible mild hypokinesis of the entireinferior myocardium. Doppler parameters are consistent with abnormal left ventricular relaxation (grade 1 diastolic dysfunction). - Mitral valve: Calcified annulus. Mildly thickened leaflets .- Left atrium: The atrium was mildly to moderately dilated. - Right atrium: The  atrium was mildly dilated.  Dg Chest Port 1 View3/26/2014  *RADIOLOGY REPORT*  Clinical Data: Stent placement.  PORTABLE CHEST - 1 VIEW  Comparison: 08/17/2012.  Findings: The cardiac silhouette, mediastinal and hilar contours are within normal limits and stable.  Stable surgical changes from bypass surgery.  No acute pulmonary findings.  No pleural effusion. The bony thorax is intact.  IMPRESSION: No acute cardiopulmonary findings.   Original Report Authenticated By: Jeffrey [name], M.D.     Discharge Medications     Medication List    STOP taking these medications       isosorbide mononitrate 30 MG 24 hr tablet  Commonly known as:  IMDUR      TAKE these medications       albuterol 108 (90 BASE) MCG/ACT inhaler  Commonly known as:  PROVENTIL HFA;VENTOLIN HFA  Inhale 2 puffs into the lungs every 6 (six) hours as needed. For shortness of breath     aspirin 81 MG chewable tablet  Chew 1 tablet (81 mg total) by mouth daily.     atorvastatin 80 MG tablet  Commonly known as:  LIPITOR  Take 1 tablet (80 mg total) by mouth daily at 6 PM.     B-COMPLEX PO  Take 1 tablet by mouth daily.     calcium carbonate 500 MG chewable tablet  Commonly known as:  TUMS - dosed in mg elemental calcium  Chew 1 tablet by mouth 4 (four) times daily - after meals and at bedtime.     carvedilol 3.125 MG tablet  Commonly known as:  COREG  Take 1 tablet (3.125 mg total) by mouth 2 (two) times daily with a meal.     fish oil-omega-3 fatty acids 1000 MG capsule  Take 1 g by mouth daily.     metFORMIN 500 MG tablet  Commonly known as:  GLUCOPHAGE  Take 1 tablet (500 mg total) by mouth 2 (two) times daily.  Start taking on:  10/09/2012     multivitamin with minerals Tabs  Take 1 tablet by mouth daily.     nitrofurantoin 100 MG capsule  Commonly known as:  MACRODANTIN  Take 100 mg by mouth at bedtime.     nitroGLYCERIN 0.4 MG SL tablet  Commonly known as:  NITROSTAT  Place 1 tablet (0.4 mg total)  under the tongue every 5 (five) minutes x 3 doses as needed for chest pain.     ONGLYZA 5 MG Tabs tablet  Generic drug:  saxagliptin HCl  Take 5 mg by mouth daily.     pantoprazole 40 MG tablet  Commonly known as:  PROTONIX  Take 40 mg by mouth daily.     potassium chloride 10 MEQ tablet  Commonly known as:  K-DUR,KLOR-CON  Take 1 tablet (10 mEq total) by mouth daily.     ramipril 2.5 MG capsule  Commonly known as:  ALTACE  Take 1 capsule (2.5 mg total) by mouth daily.     ranitidine 150 MG tablet  Commonly known as:  ZANTAC  Take 1 tablet (150 mg total) by mouth 2 (two) times daily.     STOOL SOFTENER 100 MG capsule  Generic drug:  Docusate Sodium  Take 100 mg by mouth at bedtime. Constipation     tamsulosin 0.4 MG Caps  Commonly known as:  FLOMAX  Take 0.4 mg by mouth at bedtime. For urinary health     Ticagrelor 90 MG Tabs tablet  Commonly known as:  BRILINTA  Take 1 tablet (90 mg total) by mouth 2 (two) times daily.        Disposition   The patient will be discharged in stable condition to home. Discharge Orders   Future Appointments Provider Department Dept Phone   10/15/2012 2:00 PM Jodelle Gross, NP [facility] [phone]   Future Orders Complete By Expires     Diet - low sodium heart healthy  As directed     Discharge instructions  As directed     Comments:      We stopped your Imdur because of your blood pressure running on the low side. Please check it at home periodically and call if it runs low (less than 100 on the top number) or if you have any further dizziness.  Your heart ultrasound showed mild weakness of the heart muscle this admission. This may make you more susceptible to weight gain from fluid retention, which can lead to symptoms that we call heart failure. For patients with this condition, we give them these special instructions:  1. Follow a low-salt diet and watch your fluid intake. In general, you should not be  taking in more than 2 liters of fluid per day. Some patients are restricted to less than 1.5 liters. This includes sources of water in foods like soup. 2. Weigh yourself on the same scale at same time of day and keep a log. 3. Call your doctor: (Anytime you feel any of the following symptoms)  - 3-4 pound weight gain in 1-2 days or 2 pounds overnight  - Shortness of breath, with or without a dry hacking cough  - Swelling in the hands, feet or stomach  - If you have to sleep on extra pillows at night in order to breathe    Increase activity slowly  As directed     Comments:      No driving for 2 weeks. No lifting over 10 lbs for 4 weeks. No sexual activity for 4 weeks. Keep procedure site clean & dry. If you notice increased pain, swelling, bleeding or pus, call/return!  You may shower, but no soaking baths/hot tubs/pools for 1 week.      Follow-up Information   Follow up with Joni Reining, NP. (10/15/12 at 2pm)    Contact information:   [address] [place] [address] [phone]       Follow up with Urologist. (Continue follow up with urologist as planned.)         Duration of Discharge Encounter: Greater than 30 minutes including physician and PA time.  Signed, Ronie Spies PA-C 10/08/2012, 12:38 PM  Theodore Demark, PA-C 10/09/2012 11:04 AM

## 2012-10-08 NOTE — Progress Notes (Signed)
Pt ambulated in halls this morning. Pt was in NAD, had no dyspnea, or SOB. Will continue to monitor.

## 2012-10-08 NOTE — Progress Notes (Signed)
Pt stated that he felt dizzy and light headed. I took his BP with the dinamap and it was 86/53, manually it was 106/58. Will call MD

## 2012-10-09 LAB — GLUCOSE, CAPILLARY
Glucose-Capillary: 198 mg/dL — ABNORMAL HIGH (ref 70–99)
Glucose-Capillary: 226 mg/dL — ABNORMAL HIGH (ref 70–99)

## 2012-10-09 NOTE — Discharge Summary (Signed)
Please see my rounding note. 

## 2012-10-09 NOTE — Progress Notes (Signed)
Pt discharge instructions and patient education completed. IV site d/c. Site WNL. Post cath site care explained, pt voiced understanding. Brilinta card given to patient and prescription. Explained importance of getting prescription filled today. Awaiting granddaughter for d/c home. Jeffrey [name]

## 2012-10-09 NOTE — Progress Notes (Signed)
Primary cardiologist: Dr. Golf Bing  Subjective:   Feels better today. Ready to go home. No chest pain.   Objective:   Temp:  [97.9 F (36.6 C)-98.5 F (36.9 C)] 98.2 F (36.8 C) (03/29 0459) Pulse Rate:  [70-84] 84 (03/29 1042) Resp:  [19-20] 20 (03/29 0459) BP: (86-126)/(53-83) 110/58 mmHg (03/29 1042) SpO2:  [98 %-100 %] 100 % (03/29 0459) Weight:  [171 lb 11.2 oz (77.883 kg)] 171 lb 11.2 oz (77.883 kg) (03/29 0459) Last BM Date: 10/08/12  Filed Weights   10/07/12 0447 10/08/12 0549 10/09/12 0459  Weight: 174 lb 13.2 oz (79.3 kg) 171 lb 11.2 oz (77.883 kg) 171 lb 11.2 oz (77.883 kg)    Intake/Output Summary (Last 24 hours) at 10/09/12 1045 Last data filed at 10/08/12 1800  Gross per 24 hour  Intake    360 ml  Output      0 ml  Net    360 ml   Telemetry: Sinus rhythm.  Exam:  General: NAD.  Lungs: Clear, nonlabored.  Cardiac: RRR.  Lab Results:  Basic Metabolic Panel:  Recent Labs Lab 10/06/12 1545 10/07/12 0700 10/08/12 0505  NA 138 137 138  K 4.3 4.3 4.1  CL 105 101 104  CO2  --  27 25  GLUCOSE 210* 228* 199*  BUN 16 11 11   CREATININE 1.00 0.89 0.86  CALCIUM  --  9.2 9.2    Liver Function Tests:  Recent Labs Lab 10/07/12 0700  AST 24  ALT 19  ALKPHOS 78  BILITOT 0.2*  PROT 6.4  ALBUMIN 3.3*    CBC:  Recent Labs Lab 10/06/12 1545 10/07/12 0700 10/08/12 0505  WBC  --  8.0 7.8  HGB 12.2* 11.3* 11.5*  HCT 36.0* 34.7* 34.7*  MCV  --  87.4 84.0  PLT  --  166 173    Cardiac Enzymes:  Recent Labs Lab 10/06/12 1910 10/07/12 0107 10/07/12 0700  TROPONINI 1.70* 2.32* 2.68*    BNP:  Recent Labs  04/17/12 1521 07/29/12 1340 07/29/12 1855  PROBNP 438.7 863.6* 845.2*     Medications:   Scheduled Medications: . aspirin  81 mg Oral Daily  . atorvastatin  80 mg Oral q1800  . B-complex with vitamin C  1 tablet Oral Daily  . calcium carbonate  1 tablet Oral TID PC & HS  . carvedilol  3.125 mg Oral BID WC  .  docusate sodium  100 mg Oral Daily  . famotidine  20 mg Oral BID  . insulin aspart  0-15 Units Subcutaneous TID WC  . isosorbide mononitrate  15 mg Oral Daily  . linagliptin  5 mg Oral Daily  . multivitamin with minerals  1 tablet Oral Daily  . nitrofurantoin  100 mg Oral QHS  . omega-3 acid ethyl esters  1 g Oral Daily  . pantoprazole  40 mg Oral Q1200  . potassium chloride  10 mEq Oral Daily  . ramipril  2.5 mg Oral Daily  . sodium chloride  3 mL Intravenous Q12H  . tamsulosin  0.4 mg Oral QHS  . Ticagrelor  90 mg Oral BID     PRN Medications:  sodium chloride, acetaminophen, albuterol, ALPRAZolam, magnesium hydroxide, nitroGLYCERIN, ondansetron (ZOFRAN) IV, sodium chloride, zolpidem   Assessment:   1. CAD with inferolateral STEMI this admission  - s/p DES x 1 proximal body of SVG to intermediate 10/06/12  - history: a. CABG x 4 in 1989 (VG->OM1->OM2, VG->RCA, LIMA->LAD), b. 05/2010: DES to VG-OM1/OM2, DES  to distal LCx. c. NSTEMI in 04/2011 - TO distal LCX stent and VG->OM2. d. 02/2012 NSTEMI DES to VG-OM1/continuation to OM2 occluded. e. inferior STEMI s/p DES to [facility] 06/2012.   2. HTN   3. HLD   4. Diabetes mellitus   5. BPH with foley catheter in place from home   6. Ischemic cardiomyopathy EF 45-50%   Plan/Discussion:    Discharge home today. Will stop Imdur, other continue present regimen. Followup as per recent discharge summary.   Jonelle Sidle, M.D., F.A.C.C.

## 2012-10-11 NOTE — Telephone Encounter (Signed)
Patient states that he is doing well and has no questions regarding his medications.  States HHN will be coming out today.  Advised him of post hospital visit on Friday 4/4 with Samara Deist.

## 2012-10-14 NOTE — Progress Notes (Addendum)
HPI: Jeffrey [name] is a 79 year old patient of Dr. Lexa Bing with history of CAD, multiple PCI's, recent hospitalization on 10/06/2012 after presentation to [facility] ER with complaints of chest pain with ST elevation in the inferior leads. Can STEMI was activated and patient was brought emergently to cath lab. He  was found to have severe triple-vessel disease, status post four-vessel CABG with 2 of 4 patent bypass grafts. He has severe in-stent restenosis in the proximal body of the SVG to intermediate branch.       With successful PTCA/DES x1 to the proximal body of the SVG to intermediate branch. He was to continue medical therapy with dual antiplatelet therapy. LV was deferred. Imdur was discontinued he was sent home with home health nurse and aid. He is here for close followup posthospitalization.   He denies recurrent chest pain. He is easily fatigued however. Has not had use of nitroglycerin. He remains as active as he can be that he is slowing down.  No Known Allergies  Current Outpatient Prescriptions  Medication Sig Dispense Refill  . albuterol (PROVENTIL HFA;VENTOLIN HFA) 108 (90 BASE) MCG/ACT inhaler Inhale 2 puffs into the lungs every 6 (six) hours as needed. For shortness of breath      . aspirin 81 MG chewable tablet Chew 1 tablet (81 mg total) by mouth daily.      Marland Kitchen atorvastatin (LIPITOR) 80 MG tablet Take 1 tablet (80 mg total) by mouth daily at 6 PM.  30 tablet  3  . B Complex-Biotin-FA (B-COMPLEX PO) Take 1 tablet by mouth daily.       . calcium carbonate (TUMS - DOSED IN MG ELEMENTAL CALCIUM) 500 MG chewable tablet Chew 1 tablet by mouth 4 (four) times daily - after meals and at bedtime.      . carvedilol (COREG) 3.125 MG tablet Take 1 tablet (3.125 mg total) by mouth 2 (two) times daily with a meal.  60 tablet  6  . Docusate Sodium (STOOL SOFTENER) 100 MG capsule Take 100 mg by mouth at bedtime. Constipation      . fish oil-omega-3 fatty acids 1000 MG capsule Take 1 g  by mouth daily.      . isosorbide mononitrate (IMDUR) 30 MG 24 hr tablet       . metFORMIN (GLUCOPHAGE) 500 MG tablet Take 1 tablet (500 mg total) by mouth 2 (two) times daily.      . Multiple Vitamin (MULTIVITAMIN WITH MINERALS) TABS Take 1 tablet by mouth daily.      . nitrofurantoin (MACRODANTIN) 100 MG capsule Take 100 mg by mouth at bedtime.      . nitroGLYCERIN (NITROSTAT) 0.4 MG SL tablet Place 1 tablet (0.4 mg total) under the tongue every 5 (five) minutes x 3 doses as needed for chest pain.  25 tablet  3  . omeprazole (PRILOSEC) 40 MG capsule       . pantoprazole (PROTONIX) 40 MG tablet Take 40 mg by mouth daily.       . potassium chloride (K-DUR,KLOR-CON) 10 MEQ tablet Take 1 tablet (10 mEq total) by mouth daily.  30 tablet  3  . ramipril (ALTACE) 2.5 MG capsule Take 1 capsule (2.5 mg total) by mouth daily.  30 capsule  0  . ranitidine (ZANTAC) 150 MG tablet Take 1 tablet (150 mg total) by mouth 2 (two) times daily.  10 tablet  0  . saxagliptin HCl (ONGLYZA) 5 MG TABS tablet Take 5 mg by mouth daily.       Marland Kitchen  Tamsulosin HCl (FLOMAX) 0.4 MG CAPS Take 0.4 mg by mouth at bedtime. For urinary health      . Ticagrelor (BRILINTA) 90 MG TABS tablet Take 1 tablet (90 mg total) by mouth 2 (two) times daily.  60 tablet  6  . TRADJENTA 5 MG TABS tablet        No current facility-administered medications for this visit.    Past Medical History  Diagnosis Date  . Diabetes mellitus, type II   . Hyperlipidemia     Lipid profile in 02/2012:135, 227, 41, 49  . Arteriosclerotic cardiovascular disease (ASCVD)     a. CABG x 4 in 1989 (VG->OM1->OM2, VG->RCA, LIMA->LAD), b. 05/2010: DES to VG-OM1/OM2, DES to distal LCx. c. NSTEMI in 04/2011 - TO distal LCX stent and VG->OM2. d. 02/2012 NSTEMI DES to VG-OM1/continuation to OM2 occluded. e. inferior STEMI s/p DES to SVG-RAMUS 06/2012. f. inferolat STEMI 09/2012 s/p DES to SVG-interm.  . Hypertension   . Osteoarthritis   . CVA (cerebral infarction)      details unclear. pt reports light stroke last year involving L leg. No imaging to suggest hemorrhagic etiology  . Pneumonia   . Cervical vertebral fracture   . Chronic back pain   . Benign prostatic hypertrophy     Required urinary catheter x4 weeks for retention  . Peptic ulcer disease   . Gastroesophageal reflux disease   . Ischemic cardiomyopathy     EF 45-50% 3/32014  . DM (diabetes mellitus)     Past Surgical History  Procedure Laterality Date  . Coronary angioplasty with stent placement      2011, 02/2012  . Tonsillectomy    . Coronary artery bypass graft      ZOX:WRUEAV of systems complete and found to be negative unless listed above  PHYSICAL EXAM BP 112/71  Pulse 87  Ht 5\' 10"  (1.778 m)  Wt 180 lb 4 oz (81.761 kg)  BMI 25.86 kg/m2 \\General : Well developed, well nourished, in no acute distress Head: Eyes PERRLA, No xanthomas.   Normal cephalic and atramatic  Lungs: Clear bilaterally to auscultation and percussion. Heart: HRRR S1 S2, without MRG.  Pulses are 2+ & equal.            No carotid bruit. No JVD.  No abdominal bruits. No femoral bruits. Abdomen: Bowel sounds are positive, abdomen soft and non-tender without masses or                  Hernia's noted. Msk:  Back normal, normal gait. Normal strength and tone for age. Extremities: No clubbing, cyanosis or edema.  DP +1 Neuro: Alert and oriented X 3. Psych:  Good affect, responds appropriately EKG: NSR with T-wave inversion inferiorly . T-wave flattening laterally.  ASSESSMENT AND PLAN

## 2012-10-15 ENCOUNTER — Encounter: Payer: Self-pay | Admitting: Adult Health

## 2012-10-15 ENCOUNTER — Ambulatory Visit (INDEPENDENT_AMBULATORY_CARE_PROVIDER_SITE_OTHER): Payer: Medicare Other | Admitting: Adult Health

## 2012-10-15 VITALS — BP 112/71 | HR 87 | Ht 70.0 in | Wt 180.2 lb

## 2012-10-15 DIAGNOSIS — I10 Essential (primary) hypertension: Secondary | ICD-10-CM

## 2012-10-15 DIAGNOSIS — I7090 Unspecified atherosclerosis: Secondary | ICD-10-CM

## 2012-10-15 DIAGNOSIS — I2510 Atherosclerotic heart disease of native coronary artery without angina pectoris: Secondary | ICD-10-CM

## 2012-10-15 NOTE — Assessment & Plan Note (Signed)
This unfortunate gentleman has complex multivessel CAD, with recurrent admissions and PCI most recent in March of 2013 with PTCA and stenting of the SVG to the ramus intermediate. He continues on optimal medical therapy. He has not required use of nitroglycerin. He is still easily fatigued. He has not had any complaints other than recurrent fatigue. We will continue current medical management with close followup in one month. Uncertain life expectancy secondary to multiple interventions and infarct since.

## 2012-10-15 NOTE — Assessment & Plan Note (Signed)
Blood pressure is fair well-controlled on this visit. He will remain on carvedilol 3.125 mg daily Altace 2.5 mg daily and Proventil. With aspirin.

## 2012-10-15 NOTE — Patient Instructions (Addendum)
Your physician recommends that you schedule a follow-up appointment in: 1 month  

## 2012-10-15 NOTE — Progress Notes (Deleted)
Name: Jeffrey [name]    DOB: 08-27-1928  Age: 79 y.o.  MR#: [id number]       PCP:  [name], MD      Insurance: Payor: MEDICARE  Plan: MEDICARE PART A AND B  Product Type: *No Product type*    CC:    Chief Complaint  Patient presents with  . Coronary Artery Disease    S/P Cath 10/06/2012    VS Filed Vitals:   10/15/12 1355  BP: 112/71  Pulse: 87  Height: 5\' 10"  (1.778 m)  Weight: 180 lb 4 oz (81.761 kg)    Weights Current Weight  10/15/12 180 lb 4 oz (81.761 kg)  10/09/12 171 lb 11.2 oz (77.883 kg)  10/09/12 171 lb 11.2 oz (77.883 kg)    Blood Pressure  BP Readings from Last 3 Encounters:  10/15/12 112/71  10/09/12 110/58  10/09/12 110/58     Admit date:  (Not on file) Last encounter with RMR:  08/02/2012   Allergy Review of patient's allergies indicates no known allergies.  Current Outpatient Prescriptions  Medication Sig Dispense Refill  . albuterol (PROVENTIL HFA;VENTOLIN HFA) 108 (90 BASE) MCG/ACT inhaler Inhale 2 puffs into the lungs every 6 (six) hours as needed. For shortness of breath      . aspirin 81 MG chewable tablet Chew 1 tablet (81 mg total) by mouth daily.      Marland Kitchen atorvastatin (LIPITOR) 80 MG tablet Take 1 tablet (80 mg total) by mouth daily at 6 PM.  30 tablet  3  . B Complex-Biotin-FA (B-COMPLEX PO) Take 1 tablet by mouth daily.       . calcium carbonate (TUMS - DOSED IN MG ELEMENTAL CALCIUM) 500 MG chewable tablet Chew 1 tablet by mouth 4 (four) times daily - after meals and at bedtime.      . carvedilol (COREG) 3.125 MG tablet Take 1 tablet (3.125 mg total) by mouth 2 (two) times daily with a meal.  60 tablet  6  . Docusate Sodium (STOOL SOFTENER) 100 MG capsule Take 100 mg by mouth at bedtime. Constipation      . fish oil-omega-3 fatty acids 1000 MG capsule Take 1 g by mouth daily.      . isosorbide mononitrate (IMDUR) 30 MG 24 hr tablet       . metFORMIN (GLUCOPHAGE) 500 MG tablet Take 1 tablet (500 mg total) by mouth 2 (two) times daily.       . Multiple Vitamin (MULTIVITAMIN WITH MINERALS) TABS Take 1 tablet by mouth daily.      . nitrofurantoin (MACRODANTIN) 100 MG capsule Take 100 mg by mouth at bedtime.      . nitroGLYCERIN (NITROSTAT) 0.4 MG SL tablet Place 1 tablet (0.4 mg total) under the tongue every 5 (five) minutes x 3 doses as needed for chest pain.  25 tablet  3  . omeprazole (PRILOSEC) 40 MG capsule       . pantoprazole (PROTONIX) 40 MG tablet Take 40 mg by mouth daily.       . potassium chloride (K-DUR,KLOR-CON) 10 MEQ tablet Take 1 tablet (10 mEq total) by mouth daily.  30 tablet  3  . ramipril (ALTACE) 2.5 MG capsule Take 1 capsule (2.5 mg total) by mouth daily.  30 capsule  0  . ranitidine (ZANTAC) 150 MG tablet Take 1 tablet (150 mg total) by mouth 2 (two) times daily.  10 tablet  0  . saxagliptin HCl (ONGLYZA) 5 MG TABS tablet Take 5 mg  by mouth daily.       . Tamsulosin HCl (FLOMAX) 0.4 MG CAPS Take 0.4 mg by mouth at bedtime. For urinary health      . Ticagrelor (BRILINTA) 90 MG TABS tablet Take 1 tablet (90 mg total) by mouth 2 (two) times daily.  60 tablet  6  . TRADJENTA 5 MG TABS tablet        No current facility-administered medications for this visit.    Discontinued Meds:    Medications Discontinued During This Encounter  Medication Reason  . nitrofurantoin, macrocrystal-monohydrate, (MACROBID) 100 MG capsule Error    Patient Active Problem List  Diagnosis  . BPH (benign prostatic hyperplasia)  . Hyperlipidemia  . Arteriosclerotic cardiovascular disease (ASCVD)  . Diabetes mellitus, type II  . Hypertension  . Cerebrovascular disease  . Cardiomyopathy, ischemic  . Orthostatic hypotension  . ST elevation myocardial infarction (STEMI) of inferolateral wall, initial episode of care    LABS    Component Value Date/Time   NA 138 10/08/2012 0505   NA 137 10/07/2012 0700   NA 138 10/06/2012 1545   K 4.1 10/08/2012 0505   K 4.3 10/07/2012 0700   K 4.3 10/06/2012 1545   CL 104 10/08/2012 0505    CL 101 10/07/2012 0700   CL 105 10/06/2012 1545   CO2 25 10/08/2012 0505   CO2 27 10/07/2012 0700   CO2 23 09/29/2012 1015   GLUCOSE 199* 10/08/2012 0505   GLUCOSE 228* 10/07/2012 0700   GLUCOSE 210* 10/06/2012 1545   BUN 11 10/08/2012 0505   BUN 11 10/07/2012 0700   BUN 16 10/06/2012 1545   CREATININE 0.86 10/08/2012 0505   CREATININE 0.89 10/07/2012 0700   CREATININE 1.00 10/06/2012 1545   CREATININE 0.98 12/04/2011 1208   CALCIUM 9.2 10/08/2012 0505   CALCIUM 9.2 10/07/2012 0700   CALCIUM 9.4 09/29/2012 1015   GFRNONAA 79* 10/08/2012 0505   GFRNONAA 78* 10/07/2012 0700   GFRNONAA 78* 09/29/2012 1015   GFRAA >90 10/08/2012 0505   GFRAA >90 10/07/2012 0700   GFRAA >90 09/29/2012 1015   CMP     Component Value Date/Time   NA 138 10/08/2012 0505   K 4.1 10/08/2012 0505   CL 104 10/08/2012 0505   CO2 25 10/08/2012 0505   GLUCOSE 199* 10/08/2012 0505   BUN 11 10/08/2012 0505   CREATININE 0.86 10/08/2012 0505   CREATININE 0.98 12/04/2011 1208   CALCIUM 9.2 10/08/2012 0505   PROT 6.4 10/07/2012 0700   ALBUMIN 3.3* 10/07/2012 0700   AST 24 10/07/2012 0700   ALT 19 10/07/2012 0700   ALKPHOS 78 10/07/2012 0700   BILITOT 0.2* 10/07/2012 0700   GFRNONAA 79* 10/08/2012 0505   GFRAA >90 10/08/2012 0505       Component Value Date/Time   WBC 7.8 10/08/2012 0505   WBC 8.0 10/07/2012 0700   WBC 7.7 09/29/2012 1015   HGB 11.5* 10/08/2012 0505   HGB 11.3* 10/07/2012 0700   HGB 12.2* 10/06/2012 1545   HCT 34.7* 10/08/2012 0505   HCT 34.7* 10/07/2012 0700   HCT 36.0* 10/06/2012 1545   MCV 84.0 10/08/2012 0505   MCV 87.4 10/07/2012 0700   MCV 86.8 09/29/2012 1015    Lipid Panel     Component Value Date/Time   CHOL 124 10/07/2012 0700   TRIG 211* 10/07/2012 0700   HDL 31* 10/07/2012 0700   CHOLHDL 4.0 10/07/2012 0700   VLDL 42* 10/07/2012 0700   LDLCALC 51 10/07/2012 0700  ABG    Component Value Date/Time   TCO2 23 10/06/2012 1545     Lab Results  Component Value Date   TSH 1.438 10/06/2012   BNP (last 3  results)  Recent Labs  04/17/12 1521 07/29/12 1340 07/29/12 1855  PROBNP 438.7 863.6* 845.2*   Cardiac Panel (last 3 results) No results found for this basename: CKTOTAL, CKMB, TROPONINI, RELINDX,  in the last 72 hours  Iron/TIBC/Ferritin No results found for this basename: iron, tibc, ferritin     EKG Orders placed in visit on 10/15/12  . EKG 12-LEAD     Prior Assessment and Plan Problem List as of 10/15/2012     ICD-9-CM     Cardiology Problems   Hyperlipidemia   Last Assessment & Plan   09/09/2012 Office Visit Edited 09/11/2012 11:22 PM by Kathlen Brunswick, MD     Lipid profile a few months ago was excellent except for elevated triglycerides, which can likely be improved with better control of diabetes.    Arteriosclerotic cardiovascular disease (ASCVD)   Last Assessment & Plan   09/09/2012 Office Visit Written 09/09/2012  3:40 PM by Kathlen Brunswick, MD     No recent symptoms to suggest progression of coronary disease. Ischemic cardiomyopathy is moderate in severity, and CHF is well compensated with current therapy.    Hypertension   Last Assessment & Plan   08/02/2012 Office Visit Written 08/02/2012  4:01 PM by Jodelle Gross, NP     Low normal today. Likely from dehydration from excessive diarrhea with use of laxatives. I have asked him to take in more fluids today to help to rehydrate. He has stopped the ACE inhibitor, which under the conditions, was a good choice as he is hypotensive today. Will see him in one month.     Cerebrovascular disease   Last Assessment & Plan   09/09/2012 Office Visit Written 09/09/2012  3:42 PM by Kathlen Brunswick, MD     Recent carotid ultrasound negative for obstruction. Optimal control of cardiovascular risk factors appears effective in preventing progression of disease and will be continued.    Cardiomyopathy, ischemic   Last Assessment & Plan   08/02/2012 Office Visit Written 08/02/2012  4:02 PM by Jodelle Gross, NP     Will  continue on carvedilol, ASA and follow. EF 40% on last cardiac cath in December of 2013. No ACE at this time as he refuses.    Orthostatic hypotension   Last Assessment & Plan   09/09/2012 Office Visit Written 09/09/2012  3:42 PM by Kathlen Brunswick, MD     No symptoms nor evidence for hypotension at present.    ST elevation myocardial infarction (STEMI) of inferolateral wall, initial episode of care     Other   BPH (benign prostatic hyperplasia)   Last Assessment & Plan   09/09/2012 Office Visit Edited 09/11/2012 11:21 PM by Kathlen Brunswick, MD     Patient advised not to increase his dose of Flomax or other medications without specific instructions and will resume his usual daily dose.  He is scheduled to return to see Dr. Jerre Simon in the near future for catheter removal.    Diabetes mellitus, type II   Last Assessment & Plan   09/09/2012 Office Visit Written 09/09/2012  3:38 PM by Kathlen Brunswick, MD     Probably requires increased therapy for diabetes. Patient will discuss this with Dr. Sherwood Gambler at his next visit.  Imaging: Dg Chest Port 1 View  10/06/2012  *RADIOLOGY REPORT*  Clinical Data: Stent placement.  PORTABLE CHEST - 1 VIEW  Comparison: 08/17/2012.  Findings: The cardiac silhouette, mediastinal and hilar contours are within normal limits and stable.  Stable surgical changes from bypass surgery.  No acute pulmonary findings.  No pleural effusion. The bony thorax is intact.  IMPRESSION: No acute cardiopulmonary findings.   Original Report Authenticated By: Rudie Meyer, M.D.

## 2012-10-15 NOTE — Progress Notes (Deleted)
Name: Jeffrey [name]    DOB: [birth year]  Age: 79 y.o.  MR#: [id number]       PCP:  [name], MD      Insurance: Payor: MEDICARE  Plan: MEDICARE PART A AND B  Product Type: *No Product type*    CC:    Chief Complaint  Patient presents with  . Coronary Artery Disease    S/P Cath 10/06/2012    VS Filed Vitals:   10/15/12 1355  BP: 112/71  Pulse: 87  Height: 5\' 10"  (1.778 m)  Weight: 180 lb 4 oz (81.761 kg)    Weights Current Weight  10/15/12 180 lb 4 oz (81.761 kg)  10/09/12 171 lb 11.2 oz (77.883 kg)  10/09/12 171 lb 11.2 oz (77.883 kg)    Blood Pressure  BP Readings from Last 3 Encounters:  10/15/12 112/71  10/09/12 110/58  10/09/12 110/58     Admit date:  (Not on file) Last encounter with RMR:  08/02/2012   Allergy Review of patient's allergies indicates no known allergies.  Current Outpatient Prescriptions  Medication Sig Dispense Refill  . albuterol (PROVENTIL HFA;VENTOLIN HFA) 108 (90 BASE) MCG/ACT inhaler Inhale 2 puffs into the lungs every 6 (six) hours as needed. For shortness of breath      . aspirin 81 MG chewable tablet Chew 1 tablet (81 mg total) by mouth daily.      Marland Kitchen atorvastatin (LIPITOR) 80 MG tablet Take 1 tablet (80 mg total) by mouth daily at 6 PM.  30 tablet  3  . B Complex-Biotin-FA (B-COMPLEX PO) Take 1 tablet by mouth daily.       . calcium carbonate (TUMS - DOSED IN MG ELEMENTAL CALCIUM) 500 MG chewable tablet Chew 1 tablet by mouth 4 (four) times daily - after meals and at bedtime.      . carvedilol (COREG) 3.125 MG tablet Take 1 tablet (3.125 mg total) by mouth 2 (two) times daily with a meal.  60 tablet  6  . Docusate Sodium (STOOL SOFTENER) 100 MG capsule Take 100 mg by mouth at bedtime. Constipation      . fish oil-omega-3 fatty acids 1000 MG capsule Take 1 g by mouth daily.      . isosorbide mononitrate (IMDUR) 30 MG 24 hr tablet       . metFORMIN (GLUCOPHAGE) 500 MG tablet Take 1 tablet (500 mg total) by mouth 2 (two) times daily.       . Multiple Vitamin (MULTIVITAMIN WITH MINERALS) TABS Take 1 tablet by mouth daily.      . nitrofurantoin (MACRODANTIN) 100 MG capsule Take 100 mg by mouth at bedtime.      . nitroGLYCERIN (NITROSTAT) 0.4 MG SL tablet Place 1 tablet (0.4 mg total) under the tongue every 5 (five) minutes x 3 doses as needed for chest pain.  25 tablet  3  . omeprazole (PRILOSEC) 40 MG capsule       . pantoprazole (PROTONIX) 40 MG tablet Take 40 mg by mouth daily.       . potassium chloride (K-DUR,KLOR-CON) 10 MEQ tablet Take 1 tablet (10 mEq total) by mouth daily.  30 tablet  3  . ramipril (ALTACE) 2.5 MG capsule Take 1 capsule (2.5 mg total) by mouth daily.  30 capsule  0  . ranitidine (ZANTAC) 150 MG tablet Take 1 tablet (150 mg total) by mouth 2 (two) times daily.  10 tablet  0  . saxagliptin HCl (ONGLYZA) 5 MG TABS tablet Take 5 mg  by mouth daily.       . Tamsulosin HCl (FLOMAX) 0.4 MG CAPS Take 0.4 mg by mouth at bedtime. For urinary health      . Ticagrelor (BRILINTA) 90 MG TABS tablet Take 1 tablet (90 mg total) by mouth 2 (two) times daily.  60 tablet  6  . TRADJENTA 5 MG TABS tablet        No current facility-administered medications for this visit.    Discontinued Meds:    Medications Discontinued During This Encounter  Medication Reason  . nitrofurantoin, macrocrystal-monohydrate, (MACROBID) 100 MG capsule Error    Patient Active Problem List  Diagnosis  . BPH (benign prostatic hyperplasia)  . Hyperlipidemia  . Arteriosclerotic cardiovascular disease (ASCVD)  . Diabetes mellitus, type II  . Hypertension  . Cerebrovascular disease  . Cardiomyopathy, ischemic  . Orthostatic hypotension  . ST elevation myocardial infarction (STEMI) of inferolateral wall, initial episode of care    LABS    Component Value Date/Time   NA 138 10/08/2012 0505   NA 137 10/07/2012 0700   NA 138 10/06/2012 1545   K 4.1 10/08/2012 0505   K 4.3 10/07/2012 0700   K 4.3 10/06/2012 1545   CL 104 10/08/2012 0505    CL 101 10/07/2012 0700   CL 105 10/06/2012 1545   CO2 25 10/08/2012 0505   CO2 27 10/07/2012 0700   CO2 23 09/29/2012 1015   GLUCOSE 199* 10/08/2012 0505   GLUCOSE 228* 10/07/2012 0700   GLUCOSE 210* 10/06/2012 1545   BUN 11 10/08/2012 0505   BUN 11 10/07/2012 0700   BUN 16 10/06/2012 1545   CREATININE 0.86 10/08/2012 0505   CREATININE 0.89 10/07/2012 0700   CREATININE 1.00 10/06/2012 1545   CREATININE 0.98 12/04/2011 1208   CALCIUM 9.2 10/08/2012 0505   CALCIUM 9.2 10/07/2012 0700   CALCIUM 9.4 09/29/2012 1015   GFRNONAA 79* 10/08/2012 0505   GFRNONAA 78* 10/07/2012 0700   GFRNONAA 78* 09/29/2012 1015   GFRAA >90 10/08/2012 0505   GFRAA >90 10/07/2012 0700   GFRAA >90 09/29/2012 1015   CMP     Component Value Date/Time   NA 138 10/08/2012 0505   K 4.1 10/08/2012 0505   CL 104 10/08/2012 0505   CO2 25 10/08/2012 0505   GLUCOSE 199* 10/08/2012 0505   BUN 11 10/08/2012 0505   CREATININE 0.86 10/08/2012 0505   CREATININE 0.98 12/04/2011 1208   CALCIUM 9.2 10/08/2012 0505   PROT 6.4 10/07/2012 0700   ALBUMIN 3.3* 10/07/2012 0700   AST 24 10/07/2012 0700   ALT 19 10/07/2012 0700   ALKPHOS 78 10/07/2012 0700   BILITOT 0.2* 10/07/2012 0700   GFRNONAA 79* 10/08/2012 0505   GFRAA >90 10/08/2012 0505       Component Value Date/Time   WBC 7.8 10/08/2012 0505   WBC 8.0 10/07/2012 0700   WBC 7.7 09/29/2012 1015   HGB 11.5* 10/08/2012 0505   HGB 11.3* 10/07/2012 0700   HGB 12.2* 10/06/2012 1545   HCT 34.7* 10/08/2012 0505   HCT 34.7* 10/07/2012 0700   HCT 36.0* 10/06/2012 1545   MCV 84.0 10/08/2012 0505   MCV 87.4 10/07/2012 0700   MCV 86.8 09/29/2012 1015    Lipid Panel     Component Value Date/Time   CHOL 124 10/07/2012 0700   TRIG 211* 10/07/2012 0700   HDL 31* 10/07/2012 0700   CHOLHDL 4.0 10/07/2012 0700   VLDL 42* 10/07/2012 0700   LDLCALC 51 10/07/2012 0700  ABG    Component Value Date/Time   TCO2 23 10/06/2012 1545     Lab Results  Component Value Date   TSH 1.438 10/06/2012   BNP (last 3  results)  Recent Labs  04/17/12 1521 07/29/12 1340 07/29/12 1855  PROBNP 438.7 863.6* 845.2*   Cardiac Panel (last 3 results) No results found for this basename: CKTOTAL, CKMB, TROPONINI, RELINDX,  in the last 72 hours  Iron/TIBC/Ferritin No results found for this basename: iron, tibc, ferritin     EKG Orders placed in visit on 10/15/12  . EKG 12-LEAD     Prior Assessment and Plan Problem List as of 10/15/2012     ICD-9-CM   BPH (benign prostatic hyperplasia)   Last Assessment & Plan   09/09/2012 Office Visit Edited 09/11/2012 11:21 PM by Kathlen Brunswick, MD     Patient advised not to increase his dose of Flomax or other medications without specific instructions and will resume his usual daily dose.  He is scheduled to return to see Dr. Jerre Simon in the near future for catheter removal.    Hyperlipidemia   Last Assessment & Plan   09/09/2012 Office Visit Edited 09/11/2012 11:22 PM by Kathlen Brunswick, MD     Lipid profile a few months ago was excellent except for elevated triglycerides, which can likely be improved with better control of diabetes.    Arteriosclerotic cardiovascular disease (ASCVD)   Last Assessment & Plan   09/09/2012 Office Visit Written 09/09/2012  3:40 PM by Kathlen Brunswick, MD     No recent symptoms to suggest progression of coronary disease. Ischemic cardiomyopathy is moderate in severity, and CHF is well compensated with current therapy.    Diabetes mellitus, type II   Last Assessment & Plan   09/09/2012 Office Visit Written 09/09/2012  3:38 PM by Kathlen Brunswick, MD     Probably requires increased therapy for diabetes. Patient will discuss this with Dr. Sherwood Gambler at his next visit.    Hypertension   Last Assessment & Plan   08/02/2012 Office Visit Written 08/02/2012  4:01 PM by Jodelle Gross, NP     Low normal today. Likely from dehydration from excessive diarrhea with use of laxatives. I have asked him to take in more fluids today to help to rehydrate. He  has stopped the ACE inhibitor, which under the conditions, was a good choice as he is hypotensive today. Will see him in one month.     Cerebrovascular disease   Last Assessment & Plan   09/09/2012 Office Visit Written 09/09/2012  3:42 PM by Kathlen Brunswick, MD     Recent carotid ultrasound negative for obstruction. Optimal control of cardiovascular risk factors appears effective in preventing progression of disease and will be continued.    Cardiomyopathy, ischemic   Last Assessment & Plan   08/02/2012 Office Visit Written 08/02/2012  4:02 PM by Jodelle Gross, NP     Will continue on carvedilol, ASA and follow. EF 40% on last cardiac cath in December of 2013. No ACE at this time as he refuses.    Orthostatic hypotension   Last Assessment & Plan   09/09/2012 Office Visit Written 09/09/2012  3:42 PM by Kathlen Brunswick, MD     No symptoms nor evidence for hypotension at present.    ST elevation myocardial infarction (STEMI) of inferolateral wall, initial episode of care       Imaging: Dg Chest Port 1 View  10/06/2012  *RADIOLOGY  REPORT*  Clinical Data: Stent placement.  PORTABLE CHEST - 1 VIEW  Comparison: 08/17/2012.  Findings: The cardiac silhouette, mediastinal and hilar contours are within normal limits and stable.  Stable surgical changes from bypass surgery.  No acute pulmonary findings.  No pleural effusion. The bony thorax is intact.  IMPRESSION: No acute cardiopulmonary findings.   Original Report Authenticated By: Rudie Meyer, M.D.

## 2012-10-16 ENCOUNTER — Emergency Department (HOSPITAL_COMMUNITY): Payer: Medicare Other

## 2012-10-16 ENCOUNTER — Encounter (HOSPITAL_COMMUNITY): Payer: Self-pay | Admitting: Emergency Medicine

## 2012-10-16 ENCOUNTER — Observation Stay (HOSPITAL_COMMUNITY)
Admission: EM | Admit: 2012-10-16 | Discharge: 2012-10-17 | Disposition: A | Discharge status: Home / Self Care | Payer: Medicare Other | Attending: Family Medicine | Admitting: Family Medicine

## 2012-10-16 DIAGNOSIS — I2510 Atherosclerotic heart disease of native coronary artery without angina pectoris: Secondary | ICD-10-CM | POA: Insufficient documentation

## 2012-10-16 DIAGNOSIS — R079 Chest pain, unspecified: Principal | ICD-10-CM | POA: Insufficient documentation

## 2012-10-16 DIAGNOSIS — I252 Old myocardial infarction: Secondary | ICD-10-CM | POA: Insufficient documentation

## 2012-10-16 DIAGNOSIS — R0602 Shortness of breath: Secondary | ICD-10-CM | POA: Insufficient documentation

## 2012-10-16 DIAGNOSIS — I255 Ischemic cardiomyopathy: Secondary | ICD-10-CM | POA: Diagnosis present

## 2012-10-16 DIAGNOSIS — E119 Type 2 diabetes mellitus without complications: Secondary | ICD-10-CM | POA: Insufficient documentation

## 2012-10-16 LAB — CBC WITH DIFFERENTIAL/PLATELET
Basophils Absolute: 0 10*3/uL (ref 0.0–0.1)
Basophils Relative: 0 % (ref 0–1)
HCT: 34.8 % — ABNORMAL LOW (ref 39.0–52.0)
MCHC: 33 g/dL (ref 30.0–36.0)
Monocytes Absolute: 0.6 10*3/uL (ref 0.1–1.0)
Neutro Abs: 3.8 10*3/uL (ref 1.7–7.7)
Neutrophils Relative %: 59 % (ref 43–77)
Platelets: 189 10*3/uL (ref 150–400)
RDW: 15.3 % (ref 11.5–15.5)
WBC: 6.4 10*3/uL (ref 4.0–10.5)

## 2012-10-16 LAB — HEMOGLOBIN A1C: Mean Plasma Glucose: 186 mg/dL — ABNORMAL HIGH (ref <117)

## 2012-10-16 LAB — COMPREHENSIVE METABOLIC PANEL
ALT: 18 U/L (ref 0–53)
AST: 19 U/L (ref 0–37)
Albumin: 3.5 g/dL (ref 3.5–5.2)
Chloride: 98 mEq/L (ref 96–112)
Creatinine, Ser: 0.85 mg/dL (ref 0.50–1.35)
Sodium: 134 mEq/L — ABNORMAL LOW (ref 135–145)
Total Bilirubin: 0.1 mg/dL — ABNORMAL LOW (ref 0.3–1.2)

## 2012-10-16 LAB — GLUCOSE, CAPILLARY: Glucose-Capillary: 178 mg/dL — ABNORMAL HIGH (ref 70–99)

## 2012-10-16 LAB — TROPONIN I
Troponin I: <0.3 ng/mL (ref <0.30)
Troponin I: <0.3 ng/mL (ref <0.30)

## 2012-10-16 MED ORDER — PANTOPRAZOLE SODIUM 40 MG PO TBEC
40.0000 mg | DELAYED_RELEASE_TABLET | Freq: Every day | ORAL | Status: DC
  Administered 2012-10-16 – 2012-10-17 (×2): 40 mg via ORAL
  Filled 2012-10-16 (×2): qty 1

## 2012-10-16 MED ORDER — TAMSULOSIN HCL 0.4 MG PO CAPS
0.4000 mg | ORAL_CAPSULE | Freq: Every day | ORAL | Status: DC
  Administered 2012-10-16: 0.4 mg via ORAL
  Filled 2012-10-16: qty 1

## 2012-10-16 MED ORDER — SODIUM CHLORIDE 0.9 % IV SOLN
INTRAVENOUS | Status: DC
  Administered 2012-10-16: 05:00:00 via INTRAVENOUS

## 2012-10-16 MED ORDER — ONDANSETRON HCL 4 MG/2ML IJ SOLN: 4.0000 mg | Freq: Four times a day (QID) | INTRAMUSCULAR | Status: DC | PRN

## 2012-10-16 MED ORDER — RAMIPRIL 2.5 MG PO CAPS
2.5000 mg | ORAL_CAPSULE | Freq: Every day | ORAL | Status: DC
  Administered 2012-10-16 – 2012-10-17 (×2): 2.5 mg via ORAL
  Filled 2012-10-16 (×2): qty 1

## 2012-10-16 MED ORDER — ENOXAPARIN SODIUM 40 MG/0.4ML ~~LOC~~ SOLN
40.0000 mg | SUBCUTANEOUS | Status: DC
  Administered 2012-10-16 – 2012-10-17 (×2): 40 mg via SUBCUTANEOUS
  Filled 2012-10-16 (×2): qty 0.4

## 2012-10-16 MED ORDER — INSULIN ASPART 100 UNIT/ML ~~LOC~~ SOLN
0.0000 [IU] | Freq: Three times a day (TID) | SUBCUTANEOUS | Status: DC
  Administered 2012-10-16 (×2): 3 [IU] via SUBCUTANEOUS
  Administered 2012-10-16: 5 [IU] via SUBCUTANEOUS
  Administered 2012-10-17: 8 [IU] via SUBCUTANEOUS

## 2012-10-16 MED ORDER — NITROGLYCERIN 0.4 MG SL SUBL: 0.4000 mg | SUBLINGUAL_TABLET | SUBLINGUAL | Status: DC | PRN

## 2012-10-16 MED ORDER — ATORVASTATIN CALCIUM 40 MG PO TABS
80.0000 mg | ORAL_TABLET | Freq: Every day | ORAL | Status: DC
  Administered 2012-10-16: 80 mg via ORAL
  Filled 2012-10-16: qty 2

## 2012-10-16 MED ORDER — ASPIRIN 81 MG PO CHEW
162.0000 mg | CHEWABLE_TABLET | Freq: Once | ORAL | Status: AC
  Administered 2012-10-16: 162 mg via ORAL
  Filled 2012-10-16: qty 2

## 2012-10-16 MED ORDER — ACETAMINOPHEN 325 MG PO TABS: 650.0000 mg | ORAL_TABLET | ORAL | Status: DC | PRN

## 2012-10-16 MED ORDER — ASPIRIN EC 81 MG PO TBEC
81.0000 mg | DELAYED_RELEASE_TABLET | Freq: Every day | ORAL | Status: DC
  Administered 2012-10-17: 81 mg via ORAL
  Filled 2012-10-16: qty 1

## 2012-10-16 MED ORDER — ALBUTEROL SULFATE HFA 108 (90 BASE) MCG/ACT IN AERS: 2.0000 | INHALATION_SPRAY | Freq: Four times a day (QID) | RESPIRATORY_TRACT | Status: DC | PRN

## 2012-10-16 MED ORDER — INSULIN ASPART 100 UNIT/ML ~~LOC~~ SOLN: 0.0000 [IU] | Freq: Every day | SUBCUTANEOUS | Status: DC

## 2012-10-16 MED ORDER — TICAGRELOR 90 MG PO TABS
90.0000 mg | ORAL_TABLET | Freq: Two times a day (BID) | ORAL | Status: DC
  Administered 2012-10-16 – 2012-10-17 (×3): 90 mg via ORAL
  Filled 2012-10-16 (×5): qty 1

## 2012-10-16 MED ORDER — INSULIN ASPART 100 UNIT/ML ~~LOC~~ SOLN
4.0000 [IU] | Freq: Three times a day (TID) | SUBCUTANEOUS | Status: DC
  Administered 2012-10-16 – 2012-10-17 (×4): 4 [IU] via SUBCUTANEOUS

## 2012-10-16 MED ORDER — CARVEDILOL 3.125 MG PO TABS
3.1250 mg | ORAL_TABLET | Freq: Two times a day (BID) | ORAL | Status: DC
  Administered 2012-10-16 – 2012-10-17 (×3): 3.125 mg via ORAL
  Filled 2012-10-16 (×3): qty 1

## 2012-10-16 NOTE — ED Notes (Signed)
Called Dr Phillips Odor Back for admit

## 2012-10-16 NOTE — Progress Notes (Signed)
[facility] PROGRESS NOTE  Jeffrey [name] RUE:[id number] DOB: 1929/02/02 DOA: 10/16/2012 PCP: [name], MD  Assessment/Plan: 1. Chest pain: Some typical and atypical features. Has not recurred since admission. Initial evaluation was unremarkable. Enzymes negative thus far. 2. Recent STEMI/inferolateral: Discharge 10/09/2012. Status post DES that admission with a catheter that showed severe double vessel coronary artery disease, severe in-stent restenosis. Continue ACE, beta blocker, aspirin, Brilinta. Seen in clinic 4/4 and continued on medical therapy. 3. History of Coronary artery disease, MI, CABG, stent placement 4. Ischemic cardiomyopathy: Appears stable. 5. Diabetes mellitus type 2: Stable. Resume metformin, saxagliptin on discharge.  Plan: 1. Rule out with cardiac enzymes. Continue telemetry. 2. Repeat EKG 4/6.  Code Status: Full code Family Communication: None present Disposition Plan: Home  Brendia Sacks, MD  [facility]  Pager [phone] If 7PM-7AM, please contact night-coverage at [email or web address], password [facility] 10/16/2012, 10:55 AM  LOS: 0 days   Brief narrative: 79 y.o. male with atherosclerotic coronary artery disease, diabetes, and hypertension who was recently admitted and treated for a STEMI at [facility] on 3/36/2014. Cardiac catheterization revealed significant in-stent stenosis, severe triple vessel disease and status post four-vessel CABG with 2 of the 4 bypass grafts patent. He is being managed medically with maximal therapy under the direction of cardiology, however he developed acute onset chest pain that awoke him from sleep this evening - if felt like bad indigestion which is how his previous MI presented, he took 2 nitroglycerin and shoot to full dose aspirin and then drove himself to the emergency department after the pain continued. On arrival to the emergency department his pain resolved and at the time of the admission the pain has not reoccurred  .  Consultants:    Procedures:    HPI/Subjective: No further chest pain. Feels better. No shortness of breath, nausea, vomiting or pain in left arm neck or jaw.  Objective: Filed Vitals:   10/16/12 0400 10/16/12 0453 10/16/12 0547 10/16/12 0833  BP: 113/61 139/74 128/87   Pulse: 80 84 85 78  Temp:  97.5 F (36.4 C) 87.9 F (31.1 C)   TempSrc:   Oral   Resp: 16 16 20    Height:  5\' 10"  (1.778 m)    Weight:  79 kg (174 lb 2.6 oz)    SpO2: 97% 95% 96% 98%   No intake or output data in the 24 hours ending 10/16/12 1055 Filed Weights   10/16/12 0242 10/16/12 0453  Weight: 81.647 kg (180 lb) 79 kg (174 lb 2.6 oz)    Exam:  General:  Appears calm and comfortable Cardiovascular: RRR, no m/r/g. No LE edema. Telemetry: SR, no arrhythmias  Respiratory: CTA bilaterally, no w/r/r. Normal respiratory effort. Psychiatric: grossly normal mood and affect, speech fluent and appropriate  Data Reviewed: Basic Metabolic Panel:  Recent Labs Lab 10/16/12 0245  NA 134*  K 3.9  CL 98  CO2 24  GLUCOSE 218*  BUN 13  CREATININE 0.85  CALCIUM 9.5   Liver Function Tests:  Recent Labs Lab 10/16/12 0245  AST 19  ALT 18  ALKPHOS 87  BILITOT 0.1*  PROT 6.7  ALBUMIN 3.5   CBC:  Recent Labs Lab 10/16/12 0245  WBC 6.4  NEUTROABS 3.8  HGB 11.5*  HCT 34.8*  MCV 85.7  PLT 189   Cardiac Enzymes:  Recent Labs Lab 10/16/12 0245 10/16/12 0805  TROPONINI <0.30 <0.30   CBG:  Recent Labs Lab 10/09/12 1117 10/16/12 0749  GLUCAP 226* 171*  Recent Results (from the past 240 hour(s))  MRSA PCR SCREENING     Status: None   Collection Time    10/06/12  7:06 PM      Result Value Range Status   MRSA by PCR NEGATIVE  NEGATIVE Final   Comment:            The GeneXpert MRSA Assay (FDA     approved for NASAL specimens     only), is one component of a     comprehensive MRSA colonization     surveillance program. It is not     intended to diagnose MRSA     infection  nor to guide or     monitor treatment for     MRSA infections.     Studies: Dg Chest Port 1 View  10/16/2012  *RADIOLOGY REPORT*  Clinical Data: Chest pain.  PORTABLE CHEST - 1 VIEW  Comparison: 10/06/2012  Findings: Stable postoperative changes in the mediastinum.  Normal heart size and pulmonary vascularity.  Mild hyperinflation.  No focal consolidation in the lungs.  No blunting of costophrenic angles.  No pneumothorax.  Mediastinal contours appear intact.  No significant change since previous study.  IMPRESSION: No evidence of active pulmonary disease.   Original Report Authenticated By: Burman Nieves, M.D.     Scheduled Meds: . [START ON 10/17/2012] aspirin EC  81 mg Oral Daily  . atorvastatin  80 mg Oral q1800  . carvedilol  3.125 mg Oral BID WC  . enoxaparin (LOVENOX) injection  40 mg Subcutaneous Q24H  . insulin aspart  0-15 Units Subcutaneous TID WC  . insulin aspart  0-5 Units Subcutaneous QHS  . insulin aspart  4 Units Subcutaneous TID WC  . pantoprazole  40 mg Oral Daily  . ramipril  2.5 mg Oral Daily  . tamsulosin  0.4 mg Oral QHS  . Ticagrelor  90 mg Oral BID   Continuous Infusions: . sodium chloride 75 mL/hr at 10/16/12 1610    Active Problems:   Diabetes mellitus, type II   Cardiomyopathy, ischemic    Brendia Sacks, MD  [facility] Pager [phone] If 7PM-7AM, please contact night-coverage at [email or web address], password [facility] 10/16/2012, 10:55 AM  LOS: 0 days   Time spent: 25 minutes

## 2012-10-16 NOTE — Progress Notes (Signed)
Utilization Review Completed.   Yamin Swingler, RN, BSN Nurse Case Manager  [phone]  

## 2012-10-16 NOTE — ED Notes (Signed)
Paged DR Phillips Odor   Then called [facility]

## 2012-10-16 NOTE — H&P (Addendum)
[facility] History and Physical  HARLIE RAGLE [name]:[id number] DOB: 05/11/1929 DOA: 10/16/2012  Referring physician: Bennie Pierini PCP: [name], MD  Specialists: [facility]  Chief Complaint: Chest Pain  HPI: Jeffrey [name] is a 79 y.o. male with atherosclerotic coronary artery disease, diabetes, and hypertension who was recently admitted and treated for a STEMI at [facility] on 3/36/2014. Cardiac catheterization revealed significant in-stent stenosis, severe triple vessel disease and status post four-vessel CABG with 2 of the 4 bypass grafts patent. He is being managed medically with maximal therapy under the direction of cardiology, however he developed acute onset chest pain that awoke him from sleep this evening - if felt like bad indigestion which is how his previous MI presented, he took 2 nitroglycerin and shoot to full dose aspirin and then drove himself to the emergency department after the pain continued. On arrival to the emergency department his pain resolved and at the time of the admission the pain has not reoccurred .  Admission requested for rule out of acute coronary syndrome.  Review of Systems: The patient denies anorexia, fever, weight loss,, vision loss, decreased hearing, hoarseness, syncope,  hemoptysis, abdominal pain, melena, hematochezia,hematuria, incontinence, genital sores, muscle weakness, suspicious skin lesions, transient blindness, depression, unusual weight change, abnormal bleeding, enlarged lymph nodes, angioedema, and breast masses.  Past Medical History  Diagnosis Date  . Diabetes mellitus, type II   . Hyperlipidemia     Lipid profile in 02/2012:135, 227, 41, 49  . Arteriosclerotic cardiovascular disease (ASCVD)     a. CABG x 4 in 1989 (VG->OM1->OM2, VG->RCA, LIMA->LAD), b. 05/2010: DES to VG-OM1/OM2, DES to distal LCx. c. NSTEMI in 04/2011 - TO distal LCX stent and VG->OM2. d. 02/2012 NSTEMI DES to VG-OM1/continuation to OM2 occluded. e.  inferior STEMI s/p DES to SVG-RAMUS 06/2012. f. inferolat STEMI 09/2012 s/p DES to SVG-interm.  . Hypertension   . Osteoarthritis   . CVA (cerebral infarction)     details unclear. pt reports light stroke last year involving L leg. No imaging to suggest hemorrhagic etiology  . Pneumonia   . Cervical vertebral fracture   . Chronic back pain   . Benign prostatic hypertrophy     Required urinary catheter x4 weeks for retention  . Peptic ulcer disease   . Gastroesophageal reflux disease   . Ischemic cardiomyopathy     EF 45-50% 3/32014  . DM (diabetes mellitus)    Past Surgical History  Procedure Laterality Date  . Coronary angioplasty with stent placement      2011, 02/2012  . Tonsillectomy    . Coronary artery bypass graft     Social History:  reports that he quit smoking about 51 years ago. His smoking use included Cigarettes. He has a 20 pack-year smoking history. His smokeless tobacco use includes Chew. He reports that he does not drink alcohol or use illicit drugs. The patient reports that he lives alone, he has a daughter and friends that check on him. He has been told he should not drive, however he if he lives alone and he needs something -he will only drives short distances. His daughter has attempted to take his keys on multiple occasions but apparently he has several copies of his truck he didn't in various locations in his house. He has a home health nurse who helps him with his medication management.  No Known Allergies  Family History  Problem Relation Age of Onset  . Early death      Parents died  young  . Appendicitis Mother     Pt was 74 year old  . Heart attack Father 40    Prior to Admission medications   Medication Sig Start Date End Date Taking? Authorizing Provider  albuterol (PROVENTIL HFA;VENTOLIN HFA) 108 (90 BASE) MCG/ACT inhaler Inhale 2 puffs into the lungs every 6 (six) hours as needed. For shortness of breath    Historical Provider, MD  aspirin 81 MG  chewable tablet Chew 1 tablet (81 mg total) by mouth daily. 06/21/12   Roger A Arguello, PA-C  atorvastatin (LIPITOR) 80 MG tablet Take 1 tablet (80 mg total) by mouth daily at 6 PM. 06/21/12   Roger A Arguello, PA-C  B Complex-Biotin-FA (B-COMPLEX PO) Take 1 tablet by mouth daily.     Historical Provider, MD  calcium carbonate (TUMS - DOSED IN MG ELEMENTAL CALCIUM) 500 MG chewable tablet Chew 1 tablet by mouth 4 (four) times daily - after meals and at bedtime.    Historical Provider, MD  carvedilol (COREG) 3.125 MG tablet Take 1 tablet (3.125 mg total) by mouth 2 (two) times daily with a meal. 07/26/12   Kathlen Brunswick, MD  Docusate Sodium (STOOL SOFTENER) 100 MG capsule Take 100 mg by mouth at bedtime. Constipation    Historical Provider, MD  fish oil-omega-3 fatty acids 1000 MG capsule Take 1 g by mouth daily.    Historical Provider, MD  isosorbide mononitrate (IMDUR) 30 MG 24 hr tablet  09/16/12   Historical Provider, MD  metFORMIN (GLUCOPHAGE) 500 MG tablet Take 1 tablet (500 mg total) by mouth 2 (two) times daily. 10/09/12   Dayna N Dunn, PA-C  Multiple Vitamin (MULTIVITAMIN WITH MINERALS) TABS Take 1 tablet by mouth daily.    Historical Provider, MD  nitrofurantoin (MACRODANTIN) 100 MG capsule Take 100 mg by mouth at bedtime.    Historical Provider, MD  nitroGLYCERIN (NITROSTAT) 0.4 MG SL tablet Place 1 tablet (0.4 mg total) under the tongue every 5 (five) minutes x 3 doses as needed for chest pain. 03/12/12 03/12/13  Ok Anis, NP  omeprazole (PRILOSEC) 40 MG capsule  09/28/12   Historical Provider, MD  pantoprazole (PROTONIX) 40 MG tablet Take 40 mg by mouth daily.  12/17/11   Christiane Ha, MD  potassium chloride (K-DUR,KLOR-CON) 10 MEQ tablet Take 1 tablet (10 mEq total) by mouth daily. 06/21/12   Roger A Arguello, PA-C  ramipril (ALTACE) 2.5 MG capsule Take 1 capsule (2.5 mg total) by mouth daily. 07/30/12   Tora Kindred York, PA-C  ranitidine (ZANTAC) 150 MG tablet Take 1 tablet (150  mg total) by mouth 2 (two) times daily. 09/23/12   Hope Orlene Och, NP  saxagliptin HCl (ONGLYZA) 5 MG TABS tablet Take 5 mg by mouth daily.     Historical Provider, MD  Tamsulosin HCl (FLOMAX) 0.4 MG CAPS Take 0.4 mg by mouth at bedtime. For urinary health    Historical Provider, MD  Ticagrelor (BRILINTA) 90 MG TABS tablet Take 1 tablet (90 mg total) by mouth 2 (two) times daily. 06/21/12   Gery Pray, PA-C  TRADJENTA 5 MG TABS tablet  09/15/12   Historical Provider, MD   Physical Exam: Filed Vitals:   10/16/12 0242 10/16/12 0300 10/16/12 0400 10/16/12 0453  BP: 127/61 113/56 113/61 139/74  Pulse: 90 81 80 84  Temp: 98.1 F (36.7 C)   97.5 F (36.4 C)  TempSrc: Oral     Resp: 17 17 16 16   Height: 5\' 10"  (1.778 m)  5\' 10"  (1.778 m)  Weight: 81.647 kg (180 lb)   79 kg (174 lb 2.6 oz)  SpO2: 98% 96% 97% 95%     General:  Pale, alert and oriented x3, no acute distress, pleasant and cooperative  Eyes: Normal  ENT: Poor dentition  Neck: No JVD  Cardiovascular: Regular rate and rhythm  Respiratory: Clear to auscultation bilaterally  Abdomen: Soft nontender  Skin: Seborrheic Keratoses  Musculoskeletal: Normal muscle time, and no edema normal strength  Psychiatric: Appropriate  Neurologic: Nonfocal  Labs on Admission:  Basic Metabolic Panel:  Recent Labs Lab 10/16/12 0245  NA 134*  K 3.9  CL 98  CO2 24  GLUCOSE 218*  BUN 13  CREATININE 0.85  CALCIUM 9.5   Liver Function Tests:  Recent Labs Lab 10/16/12 0245  AST 19  ALT 18  ALKPHOS 87  BILITOT 0.1*  PROT 6.7  ALBUMIN 3.5   CBC:  Recent Labs Lab 10/16/12 0245  WBC 6.4  NEUTROABS 3.8  HGB 11.5*  HCT 34.8*  MCV 85.7  PLT 189   Cardiac Enzymes:  Recent Labs Lab 10/16/12 0245  TROPONINI <0.30    BNP (last 3 results)  Recent Labs  04/17/12 1521 07/29/12 1340 07/29/12 1855  PROBNP 438.7 863.6* 845.2*   CBG:  Recent Labs Lab 10/09/12 0622 10/09/12 1117  GLUCAP 218* 226*     Radiological Exams on Admission: Dg Chest Port 1 View  10/16/2012  *RADIOLOGY REPORT*  Clinical Data: Chest pain.  PORTABLE CHEST - 1 VIEW  Comparison: 10/06/2012  Findings: Stable postoperative changes in the mediastinum.  Normal heart size and pulmonary vascularity.  Mild hyperinflation.  No focal consolidation in the lungs.  No blunting of costophrenic angles.  No pneumothorax.  Mediastinal contours appear intact.  No significant change since previous study.  IMPRESSION: No evidence of active pulmonary disease.   Original Report Authenticated By: Burman Nieves, M.D.     EKG: Independently reviewed. Inferior Q waves.  Assessment/Plan Mr. Stehlin is an 79 year old gentleman who has had multiple myocardial infarctions and cardiac interventions including four-vessel CABG, multiple PCI's, and on this last occasion two weeks ago he was a code stemi- found to have in stent restenosis and graft vessels occluded. His EF is preserved despite hypokinetic inferior heart muscle. He has acute onset chest pain- given his risk factors he needs admission for serial troponins and telemetry monitoring. Case discussed with [facility] cardiology who recommend admission to [facility] for ACS r/o.  1. Admit for chest pain rule out under observation status 2. Monitor telemetry, cycle cardiac enzymes 3. Maximize medical management including beta blocker, ACE inhibitor and diuresis for heart failure. 4. Daily low-dose aspirin and Brlinta for antiplatelets 5. Diabetes control will be key to reducing risk for restenosis. 6. Supplemental oxygen as needed for comfort 7. Resumed all of his home meds except for his oral hypoglycemics, placed on SSI  Code Status: Full Code Family Communication: Discussed plan with patient Disposition Plan: Observation admission, may consider having CSW assist with advance care planning, ?ALF or even home CHF management program  Time spent: 70 minutes  [facility] Pager  [phone]  If 7PM-7AM, please contact night-coverage [email or web address] Password [facility] 10/16/2012, 5:45 AM

## 2012-10-16 NOTE — ED Provider Notes (Signed)
History     CSN: [id number]  Arrival date & time 10/16/12  0232   First MD Initiated Contact with Patient 10/16/12 0236      Chief Complaint  Patient presents with  . Chest Pain    (Consider location/radiation/quality/duration/timing/severity/associated sxs/prior treatment) HPI Pt with recent STEMI and extensive CAD p/w "indigestion" similar to MI. Pt states he woke in "cold sweat" with chest pain and mild SOB. No radiation. He took tums, 2 baby aspirin and 1 NTG. Pain has since resolved. No recent fever, chills, cough, lower ext swelling.  Past Medical History  Diagnosis Date  . Diabetes mellitus, type II   . Hyperlipidemia     Lipid profile in 02/2012:135, 227, 41, 49  . Arteriosclerotic cardiovascular disease (ASCVD)     a. CABG x 4 in 1989 (VG->OM1->OM2, VG->RCA, LIMA->LAD), b. 05/2010: DES to VG-OM1/OM2, DES to distal LCx. c. NSTEMI in 04/2011 - TO distal LCX stent and VG->OM2. d. 02/2012 NSTEMI DES to VG-OM1/continuation to OM2 occluded. e. inferior STEMI s/p DES to SVG-RAMUS 06/2012. f. inferolat STEMI 09/2012 s/p DES to SVG-interm.  . Hypertension   . Osteoarthritis   . CVA (cerebral infarction)     details unclear. pt reports light stroke last year involving L leg. No imaging to suggest hemorrhagic etiology  . Pneumonia   . Cervical vertebral fracture   . Chronic back pain   . Benign prostatic hypertrophy     Required urinary catheter x4 weeks for retention  . Peptic ulcer disease   . Gastroesophageal reflux disease   . Ischemic cardiomyopathy     EF 45-50% 3/32014  . DM (diabetes mellitus)     Past Surgical History  Procedure Laterality Date  . Coronary angioplasty with stent placement      2011, 02/2012  . Tonsillectomy    . Coronary artery bypass graft      Family History  Problem Relation Age of Onset  . Early death      Parents died young  . Appendicitis Mother     Pt was 4 year old  . Heart attack Father 46    History  Substance Use Topics  .  Smoking status: Former Smoker -- 2.00 packs/day for 10 years    Types: Cigarettes    Quit date: 07/14/1961  . Smokeless tobacco: Current User    Types: Chew  . Alcohol Use: No      Review of Systems  Constitutional: Positive for diaphoresis. Negative for fever.  Respiratory: Positive for shortness of breath. Negative for cough and wheezing.   Cardiovascular: Positive for chest pain. Negative for palpitations and leg swelling.  Gastrointestinal: Negative for nausea, vomiting and abdominal pain.  Neurological: Negative for dizziness, weakness, numbness and headaches.  All other systems reviewed and are negative.    Allergies  Review of patient's allergies indicates no known allergies.  Home Medications   No current outpatient prescriptions on file.  BP 128/87  Pulse 85  Temp(Src) 87.9 F (31.1 C) (Oral)  Resp 20  Ht 5\' 10"  (1.778 m)  Wt 174 lb 2.6 oz (79 kg)  BMI 24.99 kg/m2  SpO2 96%  Physical Exam  Nursing note and vitals reviewed. Constitutional: He is oriented to person, place, and time. He appears well-developed and well-nourished. No distress.  HENT:  Head: Normocephalic and atraumatic.  Dry MM  Eyes: EOM are normal. Pupils are equal, round, and reactive to light.  Neck: Normal range of motion. Neck supple.  Cardiovascular: Normal rate and regular  rhythm.   Pulmonary/Chest: Effort normal and breath sounds normal. No respiratory distress. He has no wheezes. He has no rales. He exhibits no tenderness.  Abdominal: Soft. Bowel sounds are normal. He exhibits no distension and no mass. There is no tenderness. There is no rebound and no guarding.  Musculoskeletal: Normal range of motion. He exhibits no edema and no tenderness.  No calf swelling or tenderness  Neurological: He is alert and oriented to person, place, and time.  5/5 motor in all ext, sensation intact  Skin: Skin is warm and dry. No rash noted. No erythema.  Psychiatric: He has a normal mood and affect.  His behavior is normal.    ED Course  Procedures (including critical care time)  Labs Reviewed  CBC WITH DIFFERENTIAL - Abnormal; Notable for the following:    RBC 4.06 (*)    Hemoglobin 11.5 (*)    HCT 34.8 (*)    All other components within normal limits  COMPREHENSIVE METABOLIC PANEL - Abnormal; Notable for the following:    Sodium 134 (*)    Glucose, Bld 218 (*)    Total Bilirubin 0.1 (*)    GFR calc non Af Amer 80 (*)    All other components within normal limits  TROPONIN I  TROPONIN I  TROPONIN I  TROPONIN I  TSH  OCCULT BLOOD X 1 CARD TO LAB, STOOL  HEMOGLOBIN A1C   Dg Chest Port 1 View  10/16/2012  *RADIOLOGY REPORT*  Clinical Data: Chest pain.  PORTABLE CHEST - 1 VIEW  Comparison: 10/06/2012  Findings: Stable postoperative changes in the mediastinum.  Normal heart size and pulmonary vascularity.  Mild hyperinflation.  No focal consolidation in the lungs.  No blunting of costophrenic angles.  No pneumothorax.  Mediastinal contours appear intact.  No significant change since previous study.  IMPRESSION: No evidence of active pulmonary disease.   Original Report Authenticated By: Burman Nieves, M.D.      1. Chest pain      Date: 10/16/2012  Rate: 90  Rhythm: normal sinus rhythm  QRS Axis: normal  Intervals: normal  ST/T Wave abnormalities: nonspecific T wave changes  Conduction Disutrbances:none  Narrative Interpretation:   Old EKG Reviewed: changes noted  Compared to 10/08/12 new t-wave inversions of III, aVF  MDM  Discussed with Dr Antoine Poche. Ok to be admitted to hospitalist and ruled out for acute MI. Dr Phillips Odor to admit. Pt remains stable in ED.         Loren Racer, MD 10/16/12 [phone]

## 2012-10-16 NOTE — ED Notes (Signed)
Patient complaining of intermittent chest pain that started last night. Reports woke up this morning sweating and feeling clammy. Patient just released from [facility] Saturday, had stents placed.

## 2012-10-17 DIAGNOSIS — I2510 Atherosclerotic heart disease of native coronary artery without angina pectoris: Secondary | ICD-10-CM

## 2012-10-17 DIAGNOSIS — E119 Type 2 diabetes mellitus without complications: Secondary | ICD-10-CM

## 2012-10-17 DIAGNOSIS — I7090 Unspecified atherosclerosis: Secondary | ICD-10-CM

## 2012-10-17 LAB — GLUCOSE, CAPILLARY
Glucose-Capillary: 214 mg/dL — ABNORMAL HIGH (ref 70–99)
Glucose-Capillary: 165 mg/dL — ABNORMAL HIGH (ref 70–99)

## 2012-10-17 MED ORDER — ALUM & MAG HYDROXIDE-SIMETH 200-200-20 MG/5ML PO SUSP
15.0000 mL | ORAL | Status: DC | PRN
  Administered 2012-10-17: 15 mL via ORAL
  Filled 2012-10-17: qty 30

## 2012-10-17 NOTE — Discharge Summary (Signed)
Physician Discharge Summary  Jeffrey [name]:[id number] DOB: [birth year] DOA: 10/16/2012  PCP: [name], MD  Admit date: 10/16/2012 Discharge date: 10/17/2012  Recommendations for Outpatient Follow-up:  1. Followup coronary artery disease  Follow-up Information   Follow up with [name], MD In 2 weeks.   Contact information:   [address] [place] [address] [phone]       Follow up with [name], MD. (Office will call you for appointment)    Contact information:   [address] [place] [address] [phone]      Discharge Diagnoses:  1. Chest pain 2. Recent STEMI, inferolateral 3. History coronary artery disease, MI, CABG, stent placement 4. Diabetes mellitus type 2  Discharge Condition: Improved Disposition: Home  Diet recommendation: Heart healthy, carb modified  Filed Weights   10/16/12 0242 10/16/12 0453 10/17/12 0459  Weight: 81.647 kg (180 lb) 79 kg (174 lb 2.6 oz) 78.3 kg (172 lb 9.9 oz)    History of present illness:  79 y.o. male with atherosclerotic coronary artery disease, diabetes, and hypertension who was recently admitted and treated for a STEMI at [facility] on 3/36/2014. Cardiac catheterization revealed significant in-stent stenosis, severe triple vessel disease and status post four-vessel CABG with 2 of the 4 bypass grafts patent. He is being managed medically with maximal therapy under the direction of cardiology, however he developed acute onset chest pain that awoke him from sleep this evening - if felt like bad indigestion which is how his previous MI presented, he took 2 nitroglycerin and shoot to full dose aspirin and then drove himself to the emergency department after the pain continued. On arrival to the emergency department his pain resolved and at the time of the admission the pain has not reoccurred .  Hospital Course:  [name] was admitted for further evaluation and treatment of chest  pain to [facility] after consultation via telephone with [facility] cardiology. He had no recurrent chest discomfort, cardiac enzymes were negative and hospitalization was uncomplicated. Telemetry revealed  sinus rhythm. Chest pain felt to be noncardiac. I have sent a message via Epic to K. Lawrence for followup.  1. Chest pain:    Mostly atypical. Suspect noncardiac etiology. Has not recurred since admission.  Cardiac enzymes negative. Repeat EKG 4/6--sinus rhythm, inferolateral T wave inversion without significant change compared to last test 4/5 and similar to 09/30/2012. No acute changes. Old inferior infarct. 2. Recent STEMI/inferolateral   Discharge 10/09/2012. Status post DES that admission with a catheter that showed severe double vessel coronary artery disease, severe in-stent restenosis.    Continue ACE, beta blocker, aspirin, Brilinta. Seen in clinic 4/4 and continued on medical therapy. 3. History of Coronary artery disease, MI, CABG, stent placement 4. Ischemic cardiomyopathy: Appears stable. 5. Diabetes mellitus type 2: Stable.   Resume metformin, saxagliptin on discharge. Hemoglobin A1c 8.1. Consultants: None  Procedures: None  Discharge Instructions  Discharge Orders   Future Appointments Provider Department Dept Phone   11/18/2012 1:00 PM Kathlen Brunswick, MD [facility] [phone]   Future Orders Complete By Expires     Diet - low sodium heart healthy  As directed     Diet Carb Modified  As directed     Discharge instructions  As directed     Comments:      No changes to medications. Cardiology office will contact you for followup. Call your physician or seek immediate medical attention for chest pain, shortness of  breath or worsening of condition.    Increase activity slowly  As directed         Medication List    TAKE these medications       albuterol 108 (90 BASE) MCG/ACT inhaler  Commonly known as:  PROVENTIL HFA;VENTOLIN HFA  Inhale 2  puffs into the lungs every 6 (six) hours as needed. For shortness of breath     aspirin 81 MG chewable tablet  Chew 1 tablet (81 mg total) by mouth daily.     atorvastatin 80 MG tablet  Commonly known as:  LIPITOR  Take 1 tablet (80 mg total) by mouth daily at 6 PM.     B-COMPLEX PO  Take 1 tablet by mouth daily.     calcium carbonate 500 MG chewable tablet  Commonly known as:  TUMS - dosed in mg elemental calcium  Chew 1 tablet by mouth 4 (four) times daily - after meals and at bedtime.     carvedilol 3.125 MG tablet  Commonly known as:  COREG  Take 1 tablet (3.125 mg total) by mouth 2 (two) times daily with a meal.     fish oil-omega-3 fatty acids 1000 MG capsule  Take 1 g by mouth daily.     isosorbide mononitrate 30 MG 24 hr tablet  Commonly known as:  IMDUR     metFORMIN 500 MG tablet  Commonly known as:  GLUCOPHAGE  Take 1 tablet (500 mg total) by mouth 2 (two) times daily.     multivitamin with minerals Tabs  Take 1 tablet by mouth daily.     nitrofurantoin 100 MG capsule  Commonly known as:  MACRODANTIN  Take 100 mg by mouth at bedtime.     nitroGLYCERIN 0.4 MG SL tablet  Commonly known as:  NITROSTAT  Place 1 tablet (0.4 mg total) under the tongue every 5 (five) minutes x 3 doses as needed for chest pain.     ONGLYZA 5 MG Tabs tablet  Generic drug:  saxagliptin HCl  Take 5 mg by mouth daily.     pantoprazole 40 MG tablet  Commonly known as:  PROTONIX  Take 40 mg by mouth daily.     potassium chloride 10 MEQ tablet  Commonly known as:  K-DUR,KLOR-CON  Take 1 tablet (10 mEq total) by mouth daily.     ramipril 2.5 MG capsule  Commonly known as:  ALTACE  Take 1 capsule (2.5 mg total) by mouth daily.     ranitidine 150 MG tablet  Commonly known as:  ZANTAC  Take 1 tablet (150 mg total) by mouth 2 (two) times daily.     STOOL SOFTENER 100 MG capsule  Generic drug:  Docusate Sodium  Take 100 mg by mouth at bedtime. Constipation     tamsulosin 0.4 MG  Caps  Commonly known as:  FLOMAX  Take 0.4 mg by mouth at bedtime. For urinary health     Ticagrelor 90 MG Tabs tablet  Commonly known as:  BRILINTA  Take 1 tablet (90 mg total) by mouth 2 (two) times daily.        The results of significant diagnostics from this hospitalization (including imaging, microbiology, ancillary and laboratory) are listed below for reference.    Significant Diagnostic Studies: Dg Chest Port 1 View  10/16/2012  *RADIOLOGY REPORT*  Clinical Data: Chest pain.  PORTABLE CHEST - 1 VIEW  Comparison: 10/06/2012  Findings: Stable postoperative changes in the mediastinum.  Normal heart size and pulmonary vascularity.  Mild hyperinflation.  No focal consolidation in the lungs.  No blunting of costophrenic angles.  No pneumothorax.  Mediastinal contours appear intact.  No significant change since previous study.  IMPRESSION: No evidence of active pulmonary disease.   Original Report Authenticated By: Burman Nieves, M.D.    Labs: Basic Metabolic Panel:  Recent Labs Lab 10/16/12 0245  NA 134*  K 3.9  CL 98  CO2 24  GLUCOSE 218*  BUN 13  CREATININE 0.85  CALCIUM 9.5   Liver Function Tests:  Recent Labs Lab 10/16/12 0245  AST 19  ALT 18  ALKPHOS 87  BILITOT 0.1*  PROT 6.7  ALBUMIN 3.5   CBC:  Recent Labs Lab 10/16/12 0245  WBC 6.4  NEUTROABS 3.8  HGB 11.5*  HCT 34.8*  MCV 85.7  PLT 189   Cardiac Enzymes:  Recent Labs Lab 10/16/12 0245 10/16/12 0805 10/16/12 1425 10/16/12 2036  TROPONINI <0.30 <0.30 <0.30 <0.30   CBG:  Recent Labs Lab 10/16/12 0749 10/16/12 1131  GLUCAP 171* 178*    Active Problems:   Diabetes mellitus, type II   Cardiomyopathy, ischemic   Time coordinating discharge: 20 minutes  Signed:  Brendia Sacks, MD [facility] 10/17/2012, 10:33 AM

## 2012-10-17 NOTE — Progress Notes (Signed)
Patient given d/c instructions and awaiting d/c. IV cath removed with cath intact. No swelling or pain at site. Awaiting transport down stairs.

## 2012-10-17 NOTE — Progress Notes (Signed)
[facility] PROGRESS NOTE  Jeffrey [name] LKG:[id number] DOB: 07/02/1929 DOA: 10/16/2012 PCP: [name], MD  Assessment/Plan: 1. Chest pain:   Mostly atypical. Suspect noncardiac etiology. Has not recurred since admission. I  Cardiac enzymes negative. Repeat EKG 4/6--sinus rhythm, inferolateral T wave inversion without significant change compared to last test 4/5 and similar to 09/30/2012. No acute changes. Old inferior infarct. 2. Recent STEMI/inferolateral   Discharge 10/09/2012. Status post DES that admission with a catheter that showed severe double vessel coronary artery disease, severe in-stent restenosis.   Continue ACE, beta blocker, aspirin, Brilinta. Seen in clinic 4/4 and continued on medical therapy. 3. History of Coronary artery disease, MI, CABG, stent placement 4. Ischemic cardiomyopathy: Appears stable. 5. Diabetes mellitus type 2: Stable.   Resume metformin, saxagliptin on discharge. Hemoglobin A1c 8.1.  Code Status: Full code Family Communication: None present Disposition Plan: Home today  Brendia Sacks, MD  [facility]  Pager [phone] If 7PM-7AM, please contact night-coverage at [email or web address], password [facility] 10/17/2012, 9:33 AM  LOS: 1 day   Brief narrative: 79 y.o. male with atherosclerotic coronary artery disease, diabetes, and hypertension who was recently admitted and treated for a STEMI at [facility] on 3/36/2014. Cardiac catheterization revealed significant in-stent stenosis, severe triple vessel disease and status post four-vessel CABG with 2 of the 4 bypass grafts patent. He is being managed medically with maximal therapy under the direction of cardiology, however he developed acute onset chest pain that awoke him from sleep this evening - if felt like bad indigestion which is how his previous MI presented, he took 2 nitroglycerin and shoot to full dose aspirin and then drove himself to the emergency department after the pain continued. On  arrival to the emergency department his pain resolved and at the time of the admission the pain has not reoccurred .  Consultants:    Procedures:    HPI/Subjective: He feels better. No chest pain or shortness of breath. Afebrile, vital signs stable. Discussed with RN--no issues overnight. No concerns.  Objective: Filed Vitals:   10/16/12 0833 10/16/12 1300 10/16/12 2144 10/17/12 0459  BP:  125/76 113/69 107/60  Pulse: 78 84 69 65  Temp:  97.7 F (36.5 C) 98.1 F (36.7 C) 98.3 F (36.8 C)  TempSrc:  Oral Oral Oral  Resp:  18 18 18   Height:      Weight:    78.3 kg (172 lb 9.9 oz)  SpO2: 98% 97% 97% 96%    Intake/Output Summary (Last 24 hours) at 10/17/12 0933 Last data filed at 10/17/12 0700  Gross per 24 hour  Intake    720 ml  Output      0 ml  Net    720 ml   Filed Weights   10/16/12 0242 10/16/12 0453 10/17/12 0459  Weight: 81.647 kg (180 lb) 79 kg (174 lb 2.6 oz) 78.3 kg (172 lb 9.9 oz)    Exam:  General:  Appears calm and comfortable Cardiovascular: RRR, no m/r/g. No LE edema. Telemetry: SR, no arrhythmias  Respiratory: CTA bilaterally, no w/r/r. Normal respiratory effort. Psychiatric: grossly normal mood and affect, speech fluent and appropriate  Exam current 4/6  Data Reviewed: Basic Metabolic Panel:  Recent Labs Lab 10/16/12 0245  NA 134*  K 3.9  CL 98  CO2 24  GLUCOSE 218*  BUN 13  CREATININE 0.85  CALCIUM 9.5   Liver Function Tests:  Recent Labs Lab 10/16/12 0245  AST 19  ALT 18  ALKPHOS 87  BILITOT 0.1*  PROT 6.7  ALBUMIN 3.5   CBC:  Recent Labs Lab 10/16/12 0245  WBC 6.4  NEUTROABS 3.8  HGB 11.5*  HCT 34.8*  MCV 85.7  PLT 189   Cardiac Enzymes:  Recent Labs Lab 10/16/12 0245 10/16/12 0805 10/16/12 1425 10/16/12 2036  TROPONINI <0.30 <0.30 <0.30 <0.30   CBG:  Recent Labs Lab 10/16/12 0749 10/16/12 1131  GLUCAP 171* 178*    No results found for this or any previous visit (from the past 240 hour(s)).    Studies: Dg Chest Port 1 View  10/16/2012  *RADIOLOGY REPORT*  Clinical Data: Chest pain.  PORTABLE CHEST - 1 VIEW  Comparison: 10/06/2012  Findings: Stable postoperative changes in the mediastinum.  Normal heart size and pulmonary vascularity.  Mild hyperinflation.  No focal consolidation in the lungs.  No blunting of costophrenic angles.  No pneumothorax.  Mediastinal contours appear intact.  No significant change since previous study.  IMPRESSION: No evidence of active pulmonary disease.   Original Report Authenticated By: Burman Nieves, M.D.     Scheduled Meds: . aspirin EC  81 mg Oral Daily  . atorvastatin  80 mg Oral q1800  . carvedilol  3.125 mg Oral BID WC  . enoxaparin (LOVENOX) injection  40 mg Subcutaneous Q24H  . insulin aspart  0-15 Units Subcutaneous TID WC  . insulin aspart  0-5 Units Subcutaneous QHS  . insulin aspart  4 Units Subcutaneous TID WC  . pantoprazole  40 mg Oral Daily  . ramipril  2.5 mg Oral Daily  . tamsulosin  0.4 mg Oral QHS  . Ticagrelor  90 mg Oral BID   Continuous Infusions:    Active Problems:   Diabetes mellitus, type II   Cardiomyopathy, ischemic    Brendia Sacks, MD  [facility] Pager [phone] If 7PM-7AM, please contact night-coverage at [email or web address], password [facility] 10/17/2012, 9:33 AM  LOS: 1 day

## 2012-10-21 ENCOUNTER — Encounter (HOSPITAL_COMMUNITY): Payer: Self-pay

## 2012-10-21 ENCOUNTER — Emergency Department (HOSPITAL_COMMUNITY): Payer: Medicare Other

## 2012-10-21 ENCOUNTER — Emergency Department (HOSPITAL_COMMUNITY)
Admission: EM | Admit: 2012-10-21 | Discharge: 2012-10-21 | Disposition: A | Discharge status: Home / Self Care | Payer: Medicare Other | Attending: Emergency Medicine | Admitting: Emergency Medicine

## 2012-10-21 DIAGNOSIS — N400 Enlarged prostate without lower urinary tract symptoms: Secondary | ICD-10-CM | POA: Insufficient documentation

## 2012-10-21 DIAGNOSIS — Z7982 Long term (current) use of aspirin: Secondary | ICD-10-CM | POA: Insufficient documentation

## 2012-10-21 DIAGNOSIS — I10 Essential (primary) hypertension: Secondary | ICD-10-CM | POA: Insufficient documentation

## 2012-10-21 DIAGNOSIS — Z8701 Personal history of pneumonia (recurrent): Secondary | ICD-10-CM | POA: Insufficient documentation

## 2012-10-21 DIAGNOSIS — E119 Type 2 diabetes mellitus without complications: Secondary | ICD-10-CM | POA: Insufficient documentation

## 2012-10-21 DIAGNOSIS — Z8781 Personal history of (healed) traumatic fracture: Secondary | ICD-10-CM | POA: Insufficient documentation

## 2012-10-21 DIAGNOSIS — Z8711 Personal history of peptic ulcer disease: Secondary | ICD-10-CM | POA: Insufficient documentation

## 2012-10-21 DIAGNOSIS — K219 Gastro-esophageal reflux disease without esophagitis: Secondary | ICD-10-CM | POA: Insufficient documentation

## 2012-10-21 DIAGNOSIS — M549 Dorsalgia, unspecified: Secondary | ICD-10-CM | POA: Insufficient documentation

## 2012-10-21 DIAGNOSIS — Z9861 Coronary angioplasty status: Secondary | ICD-10-CM | POA: Insufficient documentation

## 2012-10-21 DIAGNOSIS — Z8679 Personal history of other diseases of the circulatory system: Secondary | ICD-10-CM | POA: Insufficient documentation

## 2012-10-21 DIAGNOSIS — G8929 Other chronic pain: Secondary | ICD-10-CM | POA: Insufficient documentation

## 2012-10-21 DIAGNOSIS — Z79899 Other long term (current) drug therapy: Secondary | ICD-10-CM | POA: Insufficient documentation

## 2012-10-21 DIAGNOSIS — Z951 Presence of aortocoronary bypass graft: Secondary | ICD-10-CM | POA: Insufficient documentation

## 2012-10-21 DIAGNOSIS — Z87891 Personal history of nicotine dependence: Secondary | ICD-10-CM | POA: Insufficient documentation

## 2012-10-21 DIAGNOSIS — R079 Chest pain, unspecified: Secondary | ICD-10-CM | POA: Insufficient documentation

## 2012-10-21 DIAGNOSIS — M1990 Unspecified osteoarthritis, unspecified site: Secondary | ICD-10-CM | POA: Insufficient documentation

## 2012-10-21 DIAGNOSIS — E785 Hyperlipidemia, unspecified: Secondary | ICD-10-CM | POA: Insufficient documentation

## 2012-10-21 DIAGNOSIS — Z8673 Personal history of transient ischemic attack (TIA), and cerebral infarction without residual deficits: Secondary | ICD-10-CM | POA: Insufficient documentation

## 2012-10-21 LAB — CBC WITH DIFFERENTIAL/PLATELET
Basophils Absolute: 0 10*3/uL (ref 0.0–0.1)
HCT: 32.1 % — ABNORMAL LOW (ref 39.0–52.0)
Hemoglobin: 11.1 g/dL — ABNORMAL LOW (ref 13.0–17.0)
Lymphocytes Relative: 18 % (ref 12–46)
Monocytes Absolute: 0.5 10*3/uL (ref 0.1–1.0)
Neutro Abs: 3.9 10*3/uL (ref 1.7–7.7)
Neutrophils Relative %: 69 % (ref 43–77)
RDW: 14.7 % (ref 11.5–15.5)
WBC: 5.7 10*3/uL (ref 4.0–10.5)

## 2012-10-21 LAB — COMPREHENSIVE METABOLIC PANEL
Alkaline Phosphatase: 84 U/L (ref 39–117)
BUN: 12 mg/dL (ref 6–23)
CO2: 24 mEq/L (ref 19–32)
Calcium: 9.2 mg/dL (ref 8.4–10.5)
GFR calc Af Amer: >90 mL/min (ref >90)
GFR calc non Af Amer: 81 mL/min — ABNORMAL LOW (ref >90)
Glucose, Bld: 252 mg/dL — ABNORMAL HIGH (ref 70–99)
Total Protein: 6.3 g/dL (ref 6.0–8.3)

## 2012-10-21 NOTE — ED Provider Notes (Signed)
History    This chart was scribed for [name], MD by Sofie Rower, ED Scribe. The patient was seen in room APA06/AP[facility]06 and the patient's care was started at 10:20PM.    CSN: [id number]  Arrival date & time 10/21/12  1012   First MD Initiated Contact with Patient 10/21/12 1020      Chief Complaint  Patient presents with  . Chest Pain    (Consider location/radiation/quality/duration/timing/severity/associated sxs/prior treatment) The history is provided by the patient. No language interpreter was used.    Jeffrey [name] is a 79 y.o. male , with a hx of Diabetes mellitus type 2, hyperlipidemia, ASCVD, hypertension, osteoarthritis, CVA, Pneumonia, cervical vertebral fracture, chronic back pain, coronary angioplasty with stent placement (Performed in 2011, 02/2012), tonsillectomy, and CABG, who presents to the Emergency Department complaining of intermittent, progressively worsening, chest pain located at the central, lower chest, radiating upwards to the right upper chest, onset today (10/21/12). The pt reports he has been experiencing an intermittently occuring, sharp, chest pain sensation, lasting approximately 2 minutes in duration throughout the morning this morning. The pt informs he has experienced three separate chest pain episodes PTA at [facility] today (10/21/12).  The pt denies shortness of breath and sweats.  The pt does not smoke or drink alcohol.   PCP is Dr. Sherwood Gambler.    Past Medical History  Diagnosis Date  . Diabetes mellitus, type II   . Hyperlipidemia     Lipid profile in 02/2012:135, 227, 41, 49  . Arteriosclerotic cardiovascular disease (ASCVD)     a. CABG x 4 in 1989 (VG->OM1->OM2, VG->RCA, LIMA->LAD), b. 05/2010: DES to VG-OM1/OM2, DES to distal LCx. c. NSTEMI in 04/2011 - TO distal LCX stent and VG->OM2. d. 02/2012 NSTEMI DES to VG-OM1/continuation to OM2 occluded. e. inferior STEMI s/p DES to SVG-RAMUS 06/2012. f. inferolat STEMI 09/2012 s/p DES to SVG-interm.  .  Hypertension   . Osteoarthritis   . CVA (cerebral infarction)     details unclear. pt reports light stroke last year involving L leg. No imaging to suggest hemorrhagic etiology  . Pneumonia   . Cervical vertebral fracture   . Chronic back pain   . Benign prostatic hypertrophy     Required urinary catheter x4 weeks for retention  . Peptic ulcer disease   . Gastroesophageal reflux disease   . Ischemic cardiomyopathy     EF 45-50% 3/32014  . DM (diabetes mellitus)     Past Surgical History  Procedure Laterality Date  . Coronary angioplasty with stent placement      2011, 02/2012  . Tonsillectomy    . Coronary artery bypass graft      Family History  Problem Relation Age of Onset  . Early death      Parents died young  . Appendicitis Mother     Pt was 30 year old  . Heart attack Father 35    History  Substance Use Topics  . Smoking status: Former Smoker -- 2.00 packs/day for 10 years    Types: Cigarettes    Quit date: 07/14/1961  . Smokeless tobacco: Current User    Types: Chew  . Alcohol Use: No      Review of Systems  Constitutional: Negative for diaphoresis.  Respiratory: Negative for shortness of breath.   Cardiovascular: Positive for chest pain.    10 Systems reviewed and all are negative for acute change except as noted in the HPI.    Allergies  Review of patient's allergies indicates no  known allergies.  Home Medications   Current Outpatient Rx  Name  Route  Sig  Dispense  Refill  . albuterol (PROVENTIL HFA;VENTOLIN HFA) 108 (90 BASE) MCG/ACT inhaler   Inhalation   Inhale 2 puffs into the lungs every 6 (six) hours as needed. For shortness of breath         . aspirin 81 MG chewable tablet   Oral   Chew 1 tablet (81 mg total) by mouth daily.         Marland Kitchen atorvastatin (LIPITOR) 80 MG tablet   Oral   Take 1 tablet (80 mg total) by mouth daily at 6 PM.   30 tablet   3   . B Complex-Biotin-FA (B-COMPLEX PO)   Oral   Take 1 tablet by mouth  daily.          . calcium carbonate (TUMS - DOSED IN MG ELEMENTAL CALCIUM) 500 MG chewable tablet   Oral   Chew 1 tablet by mouth 4 (four) times daily - after meals and at bedtime.         . carvedilol (COREG) 3.125 MG tablet   Oral   Take 1 tablet (3.125 mg total) by mouth 2 (two) times daily with a meal.   60 tablet   6   . Docusate Sodium (STOOL SOFTENER) 100 MG capsule   Oral   Take 100 mg by mouth at bedtime. Constipation         . fish oil-omega-3 fatty acids 1000 MG capsule   Oral   Take 1 g by mouth daily.         . isosorbide mononitrate (IMDUR) 30 MG 24 hr tablet               . metFORMIN (GLUCOPHAGE) 500 MG tablet   Oral   Take 1 tablet (500 mg total) by mouth 2 (two) times daily.           !!!!!!!!!!!!!!!!!!!!!!!!!!!!!!!!!!!!!!!!!!!!!!!!!  ...   . Multiple Vitamin (MULTIVITAMIN WITH MINERALS) TABS   Oral   Take 1 tablet by mouth daily.         . nitrofurantoin (MACRODANTIN) 100 MG capsule   Oral   Take 100 mg by mouth at bedtime.         . nitroGLYCERIN (NITROSTAT) 0.4 MG SL tablet   Sublingual   Place 1 tablet (0.4 mg total) under the tongue every 5 (five) minutes x 3 doses as needed for chest pain.   25 tablet   3   . pantoprazole (PROTONIX) 40 MG tablet   Oral   Take 40 mg by mouth daily.          . potassium chloride (K-DUR,KLOR-CON) 10 MEQ tablet   Oral   Take 1 tablet (10 mEq total) by mouth daily.   30 tablet   3   . ramipril (ALTACE) 2.5 MG capsule   Oral   Take 1 capsule (2.5 mg total) by mouth daily.   30 capsule   0   . ranitidine (ZANTAC) 150 MG tablet   Oral   Take 1 tablet (150 mg total) by mouth 2 (two) times daily.   10 tablet   0   . saxagliptin HCl (ONGLYZA) 5 MG TABS tablet   Oral   Take 5 mg by mouth daily.          . Tamsulosin HCl (FLOMAX) 0.4 MG CAPS   Oral   Take 0.4 mg by mouth at bedtime. For urinary  health         . Ticagrelor (BRILINTA) 90 MG TABS tablet   Oral   Take 1 tablet (90  mg total) by mouth 2 (two) times daily.   60 tablet   6     BP 163/80  Temp(Src) 97.9 F (36.6 C)  Resp 18  Ht 5\' 10"  (1.778 m)  Wt 180 lb (81.647 kg)  BMI 25.83 kg/m2  SpO2 100%  Physical Exam  Nursing note and vitals reviewed. Constitutional: He is oriented to person, place, and time. He appears well-developed and well-nourished.  HENT:  Head: Normocephalic and atraumatic.  Eyes: Conjunctivae and EOM are normal. Pupils are equal, round, and reactive to light.  Neck: Normal range of motion. Neck supple.  Cardiovascular: Normal rate, regular rhythm and normal heart sounds.   Pulmonary/Chest: Effort normal and breath sounds normal.  Abdominal: Soft. Bowel sounds are normal.  Musculoskeletal: Normal range of motion.  Neurological: He is alert and oriented to person, place, and time.  Skin: Skin is warm and dry.  Psychiatric: He has a normal mood and affect.    ED Course  Procedures (including critical care time)  DIAGNOSTIC STUDIES: Oxygen Saturation is 100% on room air, normal by my interpretation.    COORDINATION OF CARE:  10:26 AM- Treatment plan discussed with patient. Pt agrees with treatment.     Results for orders placed during the hospital encounter of 10/21/12  COMPREHENSIVE METABOLIC PANEL      Result Value Range   Sodium 131 (*) 135 - 145 mEq/L   Potassium 4.4  3.5 - 5.1 mEq/L   Chloride 96  96 - 112 mEq/L   CO2 24  19 - 32 mEq/L   Glucose, Bld 252 (*) 70 - 99 mg/dL   BUN 12  6 - 23 mg/dL   Creatinine, Ser 4.09  0.50 - 1.35 mg/dL   Calcium 9.2  8.4 - 81.1 mg/dL   Total Protein 6.3  6.0 - 8.3 g/dL   Albumin 3.4 (*) 3.5 - 5.2 g/dL   AST 18  0 - 37 U/L   ALT 19  0 - 53 U/L   Alkaline Phosphatase 84  39 - 117 U/L   Total Bilirubin 0.2 (*) 0.3 - 1.2 mg/dL   GFR calc non Af Amer 81 (*) >90 mL/min   GFR calc Af Amer >90  >90 mL/min  CBC WITH DIFFERENTIAL      Result Value Range   WBC 5.7  4.0 - 10.5 K/uL   RBC 3.75 (*) 4.22 - 5.81 MIL/uL    Hemoglobin 11.1 (*) 13.0 - 17.0 g/dL   HCT 91.4 (*) 78.2 - 95.6 %   MCV 85.6  78.0 - 100.0 fL   MCH 29.6  26.0 - 34.0 pg   MCHC 34.6  30.0 - 36.0 g/dL   RDW 21.3  08.6 - 57.8 %   Platelets 164  150 - 400 K/uL   Neutrophils Relative 69  43 - 77 %   Neutro Abs 3.9  1.7 - 7.7 K/uL   Lymphocytes Relative 18  12 - 46 %   Lymphs Abs 1.0  0.7 - 4.0 K/uL   Monocytes Relative 9  3 - 12 %   Monocytes Absolute 0.5  0.1 - 1.0 K/uL   Eosinophils Relative 4  0 - 5 %   Eosinophils Absolute 0.2  0.0 - 0.7 K/uL   Basophils Relative 0  0 - 1 %   Basophils Absolute 0.0  0.0 -  0.1 K/uL  TROPONIN I      Result Value Range   Troponin I <0.30  <0.30 ng/mL   Dg Chest Port 1 View  10/21/2012  *RADIOLOGY REPORT*  Clinical Data: Chest pain  PORTABLE CHEST - 1 VIEW  Comparison: 10/16/2012  Findings: Normal heart size.  Clear lungs.  No pneumothorax.  No acute bony deformity.  No pleural effusion.  Postoperative changes.  IMPRESSION: No active cardiopulmonary disease.   Original Report Authenticated By: Jolaine Click, M.D.      Date: 10/21/2012  Rate: 78  Rhythm: normal sinus rhythm  QRS Axis: normal  Intervals: normal  ST/T Wave abnormalities: T wave inversion inferiorly and laterally  Conduction Disutrbances:none  Narrative Interpretation:   Old EKG Reviewed: changes noted    No diagnosis found.    MDM  Patient is pain-free in the emergency department. He appears to be normal. EKG and troponin show no acute issues issues      I personally performed the services described in this documentation, which was scribed in my presence. The recorded information has been reviewed and is accurate.    [name], MD 10/21/12 1229

## 2012-10-21 NOTE — ED Notes (Signed)
Pt resting comfortably at this time. No needs voiced. Urinal removed from bedside with new one provided. Pt removed from , per protocol. SAO2 97. No distress noted

## 2012-10-21 NOTE — ED Notes (Signed)
Pt states his chest started hurting just before sunup. States he was able to go back to sleep but the pain woke him up again. States it scared him so he came here to get checked out. Denies pain at present

## 2012-10-28 ENCOUNTER — Other Ambulatory Visit: Payer: Self-pay | Admitting: Physician Assistant

## 2012-11-03 ENCOUNTER — Emergency Department (HOSPITAL_COMMUNITY)
Admission: EM | Admit: 2012-11-03 | Discharge: 2012-11-04 | Disposition: A | Discharge status: Home / Self Care | Payer: Medicare Other | Attending: Emergency Medicine | Admitting: Emergency Medicine

## 2012-11-03 ENCOUNTER — Emergency Department (HOSPITAL_COMMUNITY): Payer: Medicare Other

## 2012-11-03 ENCOUNTER — Encounter (HOSPITAL_COMMUNITY): Payer: Self-pay

## 2012-11-03 DIAGNOSIS — G8929 Other chronic pain: Secondary | ICD-10-CM | POA: Insufficient documentation

## 2012-11-03 DIAGNOSIS — N400 Enlarged prostate without lower urinary tract symptoms: Secondary | ICD-10-CM | POA: Insufficient documentation

## 2012-11-03 DIAGNOSIS — Z8739 Personal history of other diseases of the musculoskeletal system and connective tissue: Secondary | ICD-10-CM | POA: Insufficient documentation

## 2012-11-03 DIAGNOSIS — Z7982 Long term (current) use of aspirin: Secondary | ICD-10-CM | POA: Insufficient documentation

## 2012-11-03 DIAGNOSIS — M549 Dorsalgia, unspecified: Secondary | ICD-10-CM | POA: Insufficient documentation

## 2012-11-03 DIAGNOSIS — I10 Essential (primary) hypertension: Secondary | ICD-10-CM | POA: Insufficient documentation

## 2012-11-03 DIAGNOSIS — Z87891 Personal history of nicotine dependence: Secondary | ICD-10-CM | POA: Insufficient documentation

## 2012-11-03 DIAGNOSIS — Z951 Presence of aortocoronary bypass graft: Secondary | ICD-10-CM | POA: Insufficient documentation

## 2012-11-03 DIAGNOSIS — Z8673 Personal history of transient ischemic attack (TIA), and cerebral infarction without residual deficits: Secondary | ICD-10-CM | POA: Insufficient documentation

## 2012-11-03 DIAGNOSIS — Z8711 Personal history of peptic ulcer disease: Secondary | ICD-10-CM | POA: Insufficient documentation

## 2012-11-03 DIAGNOSIS — K219 Gastro-esophageal reflux disease without esophagitis: Secondary | ICD-10-CM | POA: Insufficient documentation

## 2012-11-03 DIAGNOSIS — R42 Dizziness and giddiness: Secondary | ICD-10-CM

## 2012-11-03 DIAGNOSIS — Z79899 Other long term (current) drug therapy: Secondary | ICD-10-CM | POA: Insufficient documentation

## 2012-11-03 DIAGNOSIS — Z8701 Personal history of pneumonia (recurrent): Secondary | ICD-10-CM | POA: Insufficient documentation

## 2012-11-03 DIAGNOSIS — Z8679 Personal history of other diseases of the circulatory system: Secondary | ICD-10-CM | POA: Insufficient documentation

## 2012-11-03 DIAGNOSIS — E1169 Type 2 diabetes mellitus with other specified complication: Secondary | ICD-10-CM | POA: Insufficient documentation

## 2012-11-03 LAB — CBC WITH DIFFERENTIAL/PLATELET
Basophils Absolute: 0 10*3/uL (ref 0.0–0.1)
Eosinophils Relative: 4 % (ref 0–5)
Lymphocytes Relative: 21 % (ref 12–46)
Lymphs Abs: 1.8 10*3/uL (ref 0.7–4.0)
MCV: 85.6 fL (ref 78.0–100.0)
Neutro Abs: 5.8 10*3/uL (ref 1.7–7.7)
Neutrophils Relative %: 68 % (ref 43–77)
Platelets: 178 10*3/uL (ref 150–400)
RBC: 3.9 MIL/uL — ABNORMAL LOW (ref 4.22–5.81)
RDW: 14.8 % (ref 11.5–15.5)
WBC: 8.5 10*3/uL (ref 4.0–10.5)

## 2012-11-03 LAB — GLUCOSE, CAPILLARY: Glucose-Capillary: 178 mg/dL — ABNORMAL HIGH (ref 70–99)

## 2012-11-03 MED ORDER — SODIUM CHLORIDE 0.9 % IV SOLN: Freq: Once | INTRAVENOUS | Status: DC

## 2012-11-03 MED ORDER — SODIUM CHLORIDE 0.9 % IV BOLUS (SEPSIS): 1000.0000 mL | Freq: Once | INTRAVENOUS | Status: DC

## 2012-11-03 NOTE — ED Notes (Signed)
Pt has been feeling weak, per ems,feels that it is because his blood sugar is low

## 2012-11-04 LAB — BASIC METABOLIC PANEL
CO2: 23 mEq/L (ref 19–32)
Chloride: 98 mEq/L (ref 96–112)
Glucose, Bld: 218 mg/dL — ABNORMAL HIGH (ref 70–99)
Potassium: 4.3 mEq/L (ref 3.5–5.1)
Sodium: 134 mEq/L — ABNORMAL LOW (ref 135–145)

## 2012-11-04 NOTE — ED Provider Notes (Signed)
History     CSN: [id number]  Arrival date & time 11/03/12  2024   First MD Initiated Contact with Patient 11/03/12 2217      Chief Complaint  Patient presents with  . Weakness  . Hypoglycemia    (Consider location/radiation/quality/duration/timing/severity/associated sxs/prior treatment) HPI Jeffrey [name] is a 79 y.o. male , with a hx of DM, hyperlipidemia, ASCVD, HTN, osteoarthritis, CVA, Pneumonia, cervical vertebral fracture, chronic back pain, coronary angioplasty with stent placement (Performed in 2011, 02/2012),  CABG, who presents to the Emergency Department complaining of weakness, shakiness, blood sugar of 112. He felt badly all over.   PCP Dr. Sherwood Gambler  Past Medical History  Diagnosis Date  . Diabetes mellitus, type II   . Hyperlipidemia     Lipid profile in 02/2012:135, 227, 41, 49  . Arteriosclerotic cardiovascular disease (ASCVD)     a. CABG x 4 in 1989 (VG->OM1->OM2, VG->RCA, LIMA->LAD), b. 05/2010: DES to VG-OM1/OM2, DES to distal LCx. c. NSTEMI in 04/2011 - TO distal LCX stent and VG->OM2. d. 02/2012 NSTEMI DES to VG-OM1/continuation to OM2 occluded. e. inferior STEMI s/p DES to SVG-RAMUS 06/2012. f. inferolat STEMI 09/2012 s/p DES to SVG-interm.  . Hypertension   . Osteoarthritis   . CVA (cerebral infarction)     details unclear. pt reports light stroke last year involving L leg. No imaging to suggest hemorrhagic etiology  . Pneumonia   . Cervical vertebral fracture   . Chronic back pain   . Benign prostatic hypertrophy     Required urinary catheter x4 weeks for retention  . Peptic ulcer disease   . Gastroesophageal reflux disease   . Ischemic cardiomyopathy     EF 45-50% 3/32014  . DM (diabetes mellitus)     Past Surgical History  Procedure Laterality Date  . Coronary angioplasty with stent placement      2011, 02/2012  . Tonsillectomy    . Coronary artery bypass graft      Family History  Problem Relation Age of Onset  . Early death      Parents  died young  . Appendicitis Mother     Pt was 60 year old  . Heart attack Father 87    History  Substance Use Topics  . Smoking status: Former Smoker -- 2.00 packs/day for 10 years    Types: Cigarettes    Quit date: 07/14/1961  . Smokeless tobacco: Current User    Types: Chew  . Alcohol Use: No      Review of Systems  Constitutional: Negative for fever.       10 Systems reviewed and are negative for acute change except as noted in the HPI.  HENT: Negative for congestion.   Eyes: Negative for discharge and redness.  Respiratory: Negative for cough and shortness of breath.   Cardiovascular: Negative for chest pain.  Gastrointestinal: Negative for vomiting and abdominal pain.  Musculoskeletal: Negative for back pain.  Skin: Negative for rash.  Neurological: Negative for syncope, numbness and headaches.  Psychiatric/Behavioral:       No behavior change.    Allergies  Review of patient's allergies indicates no known allergies.  Home Medications   Current Outpatient Rx  Name  Route  Sig  Dispense  Refill  . albuterol (PROVENTIL HFA;VENTOLIN HFA) 108 (90 BASE) MCG/ACT inhaler   Inhalation   Inhale 2 puffs into the lungs every 6 (six) hours as needed. For shortness of breath         . aspirin 81  MG chewable tablet   Oral   Chew 1 tablet (81 mg total) by mouth daily.         Marland Kitchen atorvastatin (LIPITOR) 80 MG tablet   Oral   Take 1 tablet (80 mg total) by mouth daily at 6 PM.   30 tablet   3   . B Complex-Biotin-FA (B-COMPLEX PO)   Oral   Take 1 tablet by mouth daily.          . calcium carbonate (TUMS - DOSED IN MG ELEMENTAL CALCIUM) 500 MG chewable tablet   Oral   Chew 1 tablet by mouth 4 (four) times daily - after meals and at bedtime.         . carvedilol (COREG) 3.125 MG tablet   Oral   Take 1 tablet (3.125 mg total) by mouth 2 (two) times daily with a meal.   60 tablet   6   . Docusate Sodium (STOOL SOFTENER) 100 MG capsule   Oral   Take 100 mg by  mouth at bedtime. Constipation         . fish oil-omega-3 fatty acids 1000 MG capsule   Oral   Take 1 g by mouth daily.         . isosorbide mononitrate (IMDUR) 30 MG 24 hr tablet               . metFORMIN (GLUCOPHAGE) 500 MG tablet   Oral   Take 1 tablet (500 mg total) by mouth 2 (two) times daily.           !!!!!!!!!!!!!!!!!!!!!!!!!!!!!!!!!!!!!!!!!!!!!!!!!  ...   . Multiple Vitamin (MULTIVITAMIN WITH MINERALS) TABS   Oral   Take 1 tablet by mouth daily.         . nitrofurantoin (MACRODANTIN) 100 MG capsule   Oral   Take 100 mg by mouth at bedtime.         . nitroGLYCERIN (NITROSTAT) 0.4 MG SL tablet   Sublingual   Place 1 tablet (0.4 mg total) under the tongue every 5 (five) minutes x 3 doses as needed for chest pain.   25 tablet   3   . pantoprazole (PROTONIX) 40 MG tablet   Oral   Take 40 mg by mouth daily.          . potassium chloride (K-DUR,KLOR-CON) 10 MEQ tablet   Oral   Take 1 tablet (10 mEq total) by mouth daily.   30 tablet   3   . ramipril (ALTACE) 2.5 MG capsule   Oral   Take 1 capsule (2.5 mg total) by mouth daily.   30 capsule   0   . ranitidine (ZANTAC) 150 MG tablet   Oral   Take 1 tablet (150 mg total) by mouth 2 (two) times daily.   10 tablet   0   . saxagliptin HCl (ONGLYZA) 5 MG TABS tablet   Oral   Take 5 mg by mouth daily.          . sertraline (ZOLOFT) 50 MG tablet   Oral   Take 25 mg by mouth daily.         . Tamsulosin HCl (FLOMAX) 0.4 MG CAPS   Oral   Take 0.4 mg by mouth at bedtime. For urinary health         . Ticagrelor (BRILINTA) 90 MG TABS tablet   Oral   Take 1 tablet (90 mg total) by mouth 2 (two) times daily.   60 tablet  6     BP 143/78  Pulse 95  Temp(Src) 97.9 F (36.6 C) (Oral)  Resp 22  Ht 5\' 10"  (1.778 m)  Wt 180 lb (81.647 kg)  BMI 25.83 kg/m2  SpO2 100%  Physical Exam  Nursing note and vitals reviewed. Constitutional: He appears well-developed and well-nourished.   Awake, alert, nontoxic appearance.  HENT:  Head: Normocephalic and atraumatic.  Multiple seborrheic keretoses to face and forehead  Eyes: EOM are normal. Pupils are equal, round, and reactive to light.  Neck: Neck supple.  Cardiovascular: Normal rate and intact distal pulses.   Pulmonary/Chest: Effort normal and breath sounds normal. He exhibits no tenderness.  Abdominal: Soft. There is no tenderness. There is no rebound.  Musculoskeletal: He exhibits no tenderness.  Baseline ROM, no obvious new focal weakness.  Neurological:  Mental status and motor strength appears baseline for patient and situation.  Skin: No rash noted.  Psychiatric: He has a normal mood and affect.    ED Course  Procedures (including critical care time) Results for orders placed during the hospital encounter of 11/03/12  GLUCOSE, CAPILLARY      Result Value Range   Glucose-Capillary 178 (*) 70 - 99 mg/dL  CBC WITH DIFFERENTIAL      Result Value Range   WBC 8.5  4.0 - 10.5 K/uL   RBC 3.90 (*) 4.22 - 5.81 MIL/uL   Hemoglobin 11.1 (*) 13.0 - 17.0 g/dL   HCT 16.1 (*) 09.6 - 04.5 %   MCV 85.6  78.0 - 100.0 fL   MCH 28.5  26.0 - 34.0 pg   MCHC 33.2  30.0 - 36.0 g/dL   RDW 40.9  81.1 - 91.4 %   Platelets 178  150 - 400 K/uL   Neutrophils Relative 68  43 - 77 %   Neutro Abs 5.8  1.7 - 7.7 K/uL   Lymphocytes Relative 21  12 - 46 %   Lymphs Abs 1.8  0.7 - 4.0 K/uL   Monocytes Relative 7  3 - 12 %   Monocytes Absolute 0.6  0.1 - 1.0 K/uL   Eosinophils Relative 4  0 - 5 %   Eosinophils Absolute 0.3  0.0 - 0.7 K/uL   Basophils Relative 0  0 - 1 %   Basophils Absolute 0.0  0.0 - 0.1 K/uL  BASIC METABOLIC PANEL      Result Value Range   Sodium 134 (*) 135 - 145 mEq/L   Potassium 4.3  3.5 - 5.1 mEq/L   Chloride 98  96 - 112 mEq/L   CO2 23  19 - 32 mEq/L   Glucose, Bld 218 (*) 70 - 99 mg/dL   BUN 15  6 - 23 mg/dL   Creatinine, Ser 7.82  0.50 - 1.35 mg/dL   Calcium 9.4  8.4 - 95.6 mg/dL   GFR calc non Af  Amer 78 (*) >90 mL/min   GFR calc Af Amer >90  >90 mL/min     Date: 11/04/2012   2100  Rate: 93  Rhythm: normal sinus rhythm  QRS Axis: normal  Intervals: normal  ST/T Wave abnormalities: normal  Conduction Disutrbances:none  Narrative Interpretation: inferior infarct, age undertermined  Old EKG Reviewed: unchanged c/w 10/21/12    MDM  Patient with symptomatic response to blood sugar of 112 at home. He has eaten food, had beverages. He was 178 upon arrival in the ER and since has eaten. He is currently 218. He is on metformin. Reviewed results with patient.  He will check with his doctor after monitoring sugars for several days.Pt stable in ED with no significant deterioration in condition.The patient appears reasonably screened and/or stabilized for discharge and I doubt any other medical condition or other [facility] requiring further screening, evaluation, or treatment in the ED at this time prior to discharge.  MDM Reviewed: nursing note and vitals Interpretation: labs           Nicoletta Dress. Colon Branch, MD 11/04/12 [phone]

## 2012-11-05 ENCOUNTER — Emergency Department (HOSPITAL_COMMUNITY): Payer: Medicare Other

## 2012-11-05 ENCOUNTER — Encounter (HOSPITAL_COMMUNITY): Payer: Self-pay | Admitting: *Deleted

## 2012-11-05 ENCOUNTER — Emergency Department (HOSPITAL_COMMUNITY)
Admission: EM | Admit: 2012-11-05 | Discharge: 2012-11-05 | Disposition: A | Discharge status: Home / Self Care | Payer: Medicare Other | Source: Home / Self Care | Attending: Emergency Medicine | Admitting: Emergency Medicine

## 2012-11-05 DIAGNOSIS — R61 Generalized hyperhidrosis: Secondary | ICD-10-CM | POA: Insufficient documentation

## 2012-11-05 DIAGNOSIS — Z8673 Personal history of transient ischemic attack (TIA), and cerebral infarction without residual deficits: Secondary | ICD-10-CM | POA: Insufficient documentation

## 2012-11-05 DIAGNOSIS — M549 Dorsalgia, unspecified: Secondary | ICD-10-CM | POA: Insufficient documentation

## 2012-11-05 DIAGNOSIS — R0602 Shortness of breath: Secondary | ICD-10-CM | POA: Insufficient documentation

## 2012-11-05 DIAGNOSIS — Z8711 Personal history of peptic ulcer disease: Secondary | ICD-10-CM | POA: Insufficient documentation

## 2012-11-05 DIAGNOSIS — Z8739 Personal history of other diseases of the musculoskeletal system and connective tissue: Secondary | ICD-10-CM | POA: Insufficient documentation

## 2012-11-05 DIAGNOSIS — R42 Dizziness and giddiness: Secondary | ICD-10-CM | POA: Insufficient documentation

## 2012-11-05 DIAGNOSIS — R079 Chest pain, unspecified: Secondary | ICD-10-CM | POA: Insufficient documentation

## 2012-11-05 DIAGNOSIS — Z8679 Personal history of other diseases of the circulatory system: Secondary | ICD-10-CM | POA: Insufficient documentation

## 2012-11-05 DIAGNOSIS — Z8781 Personal history of (healed) traumatic fracture: Secondary | ICD-10-CM | POA: Insufficient documentation

## 2012-11-05 DIAGNOSIS — E119 Type 2 diabetes mellitus without complications: Secondary | ICD-10-CM | POA: Insufficient documentation

## 2012-11-05 DIAGNOSIS — N400 Enlarged prostate without lower urinary tract symptoms: Secondary | ICD-10-CM | POA: Insufficient documentation

## 2012-11-05 DIAGNOSIS — Z8701 Personal history of pneumonia (recurrent): Secondary | ICD-10-CM | POA: Insufficient documentation

## 2012-11-05 DIAGNOSIS — E785 Hyperlipidemia, unspecified: Secondary | ICD-10-CM | POA: Insufficient documentation

## 2012-11-05 DIAGNOSIS — Z87891 Personal history of nicotine dependence: Secondary | ICD-10-CM | POA: Insufficient documentation

## 2012-11-05 DIAGNOSIS — K219 Gastro-esophageal reflux disease without esophagitis: Secondary | ICD-10-CM | POA: Insufficient documentation

## 2012-11-05 DIAGNOSIS — Z951 Presence of aortocoronary bypass graft: Secondary | ICD-10-CM | POA: Insufficient documentation

## 2012-11-05 DIAGNOSIS — Z79899 Other long term (current) drug therapy: Secondary | ICD-10-CM | POA: Insufficient documentation

## 2012-11-05 DIAGNOSIS — I10 Essential (primary) hypertension: Secondary | ICD-10-CM | POA: Insufficient documentation

## 2012-11-05 DIAGNOSIS — Z7982 Long term (current) use of aspirin: Secondary | ICD-10-CM | POA: Insufficient documentation

## 2012-11-05 DIAGNOSIS — G8929 Other chronic pain: Secondary | ICD-10-CM | POA: Insufficient documentation

## 2012-11-05 LAB — CBC
HCT: 34.1 % — ABNORMAL LOW (ref 39.0–52.0)
Hemoglobin: 11.5 g/dL — ABNORMAL LOW (ref 13.0–17.0)
RBC: 4 MIL/uL — ABNORMAL LOW (ref 4.22–5.81)
RDW: 14.6 % (ref 11.5–15.5)
WBC: 8.3 10*3/uL (ref 4.0–10.5)

## 2012-11-05 LAB — BASIC METABOLIC PANEL
Chloride: 95 mEq/L — ABNORMAL LOW (ref 96–112)
Creatinine, Ser: 0.85 mg/dL (ref 0.50–1.35)
GFR calc Af Amer: >90 mL/min (ref >90)
Potassium: 4.5 mEq/L (ref 3.5–5.1)
Sodium: 131 mEq/L — ABNORMAL LOW (ref 135–145)

## 2012-11-05 LAB — TROPONIN I: Troponin I: <0.3 ng/mL (ref <0.30)

## 2012-11-05 NOTE — ED Notes (Signed)
Chest pain began this morning but has subsided. Pt has stent placed ~ 3 weeks ago. NAD.

## 2012-11-05 NOTE — ED Notes (Signed)
Patient with no complaints at this time. Respirations even and unlabored. Skin warm/dry. Discharge instructions reviewed with patient at this time. Patient given opportunity to voice concerns/ask questions. IV removed per policy and band-aid applied to site. Patient discharged at this time and left Emergency Department with steady gait.  

## 2012-11-05 NOTE — ED Provider Notes (Addendum)
History  This chart was scribed for Jeffrey Jakes, MD by Lacey Jensen, ED Scribe. The patient was seen in room [facility]04/[facility]04. Patient's care was started at 1104.   CSN: [id number]  Arrival date & time 11/05/12  1038   First MD Initiated Contact with Patient 11/05/12 1104      Chief Complaint  Patient presents with  . Chest Pain    Patient is a 79 y.o. male presenting with chest pain. The history is provided by the patient. No language interpreter was used.  Chest Pain Pain location:  Substernal area Pain radiates to:  Does not radiate Pain radiates to the back: no   Pain severity:  Moderate Onset quality:  Sudden Duration:  1 hour Timing:  Intermittent Progression:  Resolved Chronicity:  Recurrent Context: not breathing, no drug use, not eating, no intercourse, not lifting, no movement, not raising an arm, not at rest, no stress and no trauma   Relieved by:  Nothing Worsened by:  Nothing tried Ineffective treatments:  Aspirin Associated symptoms: back pain, diaphoresis and shortness of breath   Associated symptoms: no abdominal pain, no cough, no fever, no headache, no nausea, no numbness, not vomiting and no weakness     Jeffrey [name] is a 79 y.o. male with a history of HTN, CVA, diabetes, and GERD who presents to the Emergency Department complaining of left substernal chest pain with onset this morning around 9 am. Pain has since subsided an hour before (around 9:45am) his arrival to the ED. Pt states that he went to [employer] this morning when his pain began and felt as if he would faint.  Patient reports that he did take an ASA this morning. Chest X-rays were taken at 10:59 am and pt reports lightheadedness while xrays where being taken. Patient was seen on the 5th and 10th of April for CP and was admitted on the 5th. Pt has had a placed stent 3 weeks ago with [facility]. Patient reports that he lives alone Pt denies chest pain last night.  PCP- Dr. Sherwood Gambler   Past  Medical History  Diagnosis Date  . Diabetes mellitus, type II   . Hyperlipidemia     Lipid profile in 02/2012:135, 227, 41, 49  . Arteriosclerotic cardiovascular disease (ASCVD)     a. CABG x 4 in 1989 (VG->OM1->OM2, VG->RCA, LIMA->LAD), b. 05/2010: DES to VG-OM1/OM2, DES to distal LCx. c. NSTEMI in 04/2011 - TO distal LCX stent and VG->OM2. d. 02/2012 NSTEMI DES to VG-OM1/continuation to OM2 occluded. e. inferior STEMI s/p DES to SVG-RAMUS 06/2012. f. inferolat STEMI 09/2012 s/p DES to SVG-interm.  . Hypertension   . Osteoarthritis   . CVA (cerebral infarction)     details unclear. pt reports light stroke last year involving L leg. No imaging to suggest hemorrhagic etiology  . Pneumonia   . Cervical vertebral fracture   . Chronic back pain   . Benign prostatic hypertrophy     Required urinary catheter x4 weeks for retention  . Peptic ulcer disease   . Gastroesophageal reflux disease   . Ischemic cardiomyopathy     EF 45-50% 3/32014  . DM (diabetes mellitus)     Past Surgical History  Procedure Laterality Date  . Coronary angioplasty with stent placement      2011, 02/2012  . Tonsillectomy    . Coronary artery bypass graft      Family History  Problem Relation Age of Onset  . Early death  Parents died young  . Appendicitis Mother     Pt was 49 year old  . Heart attack Father 26    History  Substance Use Topics  . Smoking status: Former Smoker -- 2.00 packs/day for 10 years    Types: Cigarettes    Quit date: 07/14/1961  . Smokeless tobacco: Current User    Types: Chew  . Alcohol Use: No      Review of Systems  Constitutional: Positive for diaphoresis. Negative for fever and chills.  HENT: Negative for rhinorrhea.   Eyes: Negative for visual disturbance.  Respiratory: Positive for shortness of breath. Negative for cough.   Cardiovascular: Positive for chest pain.  Gastrointestinal: Negative for nausea, vomiting, abdominal pain and diarrhea.  Genitourinary:  Negative for dysuria.  Musculoskeletal: Positive for back pain.  Skin: Negative for rash.  Neurological: Positive for light-headedness. Negative for weakness, numbness and headaches.  Hematological: Does not bruise/bleed easily.  All other systems reviewed and are negative.    Allergies  Review of patient's allergies indicates no known allergies.  Home Medications   Current Outpatient Rx  Name  Route  Sig  Dispense  Refill  . albuterol (PROVENTIL HFA;VENTOLIN HFA) 108 (90 BASE) MCG/ACT inhaler   Inhalation   Inhale 2 puffs into the lungs every 6 (six) hours as needed. For shortness of breath         . aspirin 81 MG chewable tablet   Oral   Chew 1 tablet (81 mg total) by mouth daily.         Marland Kitchen atorvastatin (LIPITOR) 80 MG tablet   Oral   Take 1 tablet (80 mg total) by mouth daily at 6 PM.   30 tablet   3   . B Complex-Biotin-FA (B-COMPLEX PO)   Oral   Take 1 tablet by mouth daily.          . calcium carbonate (TUMS - DOSED IN MG ELEMENTAL CALCIUM) 500 MG chewable tablet   Oral   Chew 1 tablet by mouth 4 (four) times daily - after meals and at bedtime.         . carvedilol (COREG) 3.125 MG tablet   Oral   Take 1 tablet (3.125 mg total) by mouth 2 (two) times daily with a meal.   60 tablet   6   . Docusate Sodium (STOOL SOFTENER) 100 MG capsule   Oral   Take 100 mg by mouth at bedtime. Constipation         . fish oil-omega-3 fatty acids 1000 MG capsule   Oral   Take 1 g by mouth daily.         . isosorbide mononitrate (IMDUR) 30 MG 24 hr tablet               . metFORMIN (GLUCOPHAGE) 500 MG tablet   Oral   Take 1 tablet (500 mg total) by mouth 2 (two) times daily.           !!!!!!!!!!!!!!!!!!!!!!!!!!!!!!!!!!!!!!!!!!!!!!!!!  ...   . Multiple Vitamin (MULTIVITAMIN WITH MINERALS) TABS   Oral   Take 1 tablet by mouth daily.         . nitrofurantoin (MACRODANTIN) 100 MG capsule   Oral   Take 100 mg by mouth at bedtime.         .  nitroGLYCERIN (NITROSTAT) 0.4 MG SL tablet   Sublingual   Place 1 tablet (0.4 mg total) under the tongue every 5 (five) minutes x 3 doses as  needed for chest pain.   25 tablet   3   . pantoprazole (PROTONIX) 40 MG tablet   Oral   Take 40 mg by mouth daily.          . potassium chloride (K-DUR,KLOR-CON) 10 MEQ tablet   Oral   Take 1 tablet (10 mEq total) by mouth daily.   30 tablet   3   . ramipril (ALTACE) 2.5 MG capsule   Oral   Take 1 capsule (2.5 mg total) by mouth daily.   30 capsule   0   . ranitidine (ZANTAC) 150 MG tablet   Oral   Take 1 tablet (150 mg total) by mouth 2 (two) times daily.   10 tablet   0   . saxagliptin HCl (ONGLYZA) 5 MG TABS tablet   Oral   Take 5 mg by mouth daily.          . sertraline (ZOLOFT) 50 MG tablet   Oral   Take 25 mg by mouth daily.         . Tamsulosin HCl (FLOMAX) 0.4 MG CAPS   Oral   Take 0.4 mg by mouth at bedtime. For urinary health         . Ticagrelor (BRILINTA) 90 MG TABS tablet   Oral   Take 1 tablet (90 mg total) by mouth 2 (two) times daily.   60 tablet   6     Vitals: BP 139/63  Pulse 72  Temp(Src) 97.6 F (36.4 C) (Oral)  Resp 16  SpO2 98%  Physical Exam  Nursing note and vitals reviewed. Constitutional: He is oriented to person, place, and time. He appears well-developed and well-nourished. No distress.  HENT:  Head: Normocephalic and atraumatic.  Eyes: EOM are normal.  Neck: Neck supple. No tracheal deviation present.  Cardiovascular: Normal rate, regular rhythm and normal heart sounds.   No murmur heard. Pulmonary/Chest: Effort normal and breath sounds normal. No respiratory distress. He has no wheezes. He has no rales.  Abdominal: Soft. Bowel sounds are normal. There is no tenderness.  Musculoskeletal: Normal range of motion. He exhibits no edema.  Neurological: He is alert and oriented to person, place, and time. No cranial nerve deficit.  Skin: Skin is warm and dry.  Psychiatric: He  has a normal mood and affect. His behavior is normal.    ED Course  Procedures (including critical care time) DIAGNOSTIC STUDIES: Oxygen Saturation is 99% on room air, normal by my interpretation.    COORDINATION OF CARE: 11:43 AM-Discussed treatment plan  with pt at bedside and pt agreed to plan.   Medications - No data to display    Labs Reviewed  BASIC METABOLIC PANEL - Abnormal; Notable for the following:    Sodium 131 (*)    Chloride 95 (*)    Glucose, Bld 189 (*)    GFR calc non Af Amer 80 (*)    All other components within normal limits  CBC - Abnormal; Notable for the following:    RBC 4.00 (*)    Hemoglobin 11.5 (*)    HCT 34.1 (*)    All other components within normal limits  TROPONIN I  TROPONIN I   Dg Chest 2 View  11/05/2012  *RADIOLOGY REPORT*  Clinical Data: Chest pain  CHEST - 2 VIEW  Comparison: November 04, 2012.  Findings: Sternotomy wires are noted.  Cardiomediastinal silhouette appears normal.  No acute pulmonary disease is noted.  Bony thorax is intact.  No  pleural effusion or pneumothorax is noted.  IMPRESSION: No acute cardiopulmonary abnormality seen.   Original Report Authenticated By: Lupita Raider.,  M.D.    Dg Chest 2 View  11/04/2012  *RADIOLOGY REPORT*  Clinical Data: Weakness and hypoglycemia.  Ex-smoker.  Diabetes.  CHEST - 2 VIEW  Comparison: 10/21/2012  Findings: Mild hyperinflation. Lateral view degraded by patient arm position.  Prior median sternotomy. Midline trachea.  Normal heart size without the atherosclerosis in the transverse aorta.  Tortuous descending thoracic aorta. No pleural effusion or pneumothorax. Clear lungs.  No congestive failure.  IMPRESSION: Hyperinflation, without acute disease.   Original Report Authenticated By: Jeronimo Greaves, M.D.    Results for orders placed during the hospital encounter of 11/05/12  TROPONIN I      Result Value Range   Troponin I <0.30  <0.30 ng/mL  BASIC METABOLIC PANEL      Result Value Range    Sodium 131 (*) 135 - 145 mEq/L   Potassium 4.5  3.5 - 5.1 mEq/L   Chloride 95 (*) 96 - 112 mEq/L   CO2 24  19 - 32 mEq/L   Glucose, Bld 189 (*) 70 - 99 mg/dL   BUN 13  6 - 23 mg/dL   Creatinine, Ser 1.61  0.50 - 1.35 mg/dL   Calcium 9.6  8.4 - 09.6 mg/dL   GFR calc non Af Amer 80 (*) >90 mL/min   GFR calc Af Amer >90  >90 mL/min  CBC      Result Value Range   WBC 8.3  4.0 - 10.5 K/uL   RBC 4.00 (*) 4.22 - 5.81 MIL/uL   Hemoglobin 11.5 (*) 13.0 - 17.0 g/dL   HCT 04.5 (*) 40.9 - 81.1 %   MCV 85.3  78.0 - 100.0 fL   MCH 28.8  26.0 - 34.0 pg   MCHC 33.7  30.0 - 36.0 g/dL   RDW 91.4  78.2 - 95.6 %   Platelets 201  150 - 400 K/uL  TROPONIN I      Result Value Range   Troponin I <0.30  <0.30 ng/mL    Date: 11/05/2012  Rate: 83  Rhythm: normal sinus rhythm  QRS Axis: normal  Intervals: normal  ST/T Wave abnormalities: nonspecific T wave changes  Conduction Disutrbances:none  Narrative Interpretation:   Old EKG Reviewed: none available Patient with known coronary disease has some Q waves in the lead 3 T-wave inversions inferiorly.   1. Chest pain       MDM  I discussed with hospitalist here. They concur that admission with rule out that may be most appropriate but with this being on Friday we do not cardiology coverage over the weekends they recommend discussing with the [facility] cardiology down to [facility] for transfer and admission air. Patient without the additional chest pain but still feels fatigued in and more now more so than usual. He admits that he has been fatigued some since the last stent placement but this has been worse today. Patient's workup so far has been negative for acute cardiac event however patient has severe coronary artery disease and multiple stents. Cannot rule out completely that patient has not had a cardiac event. Patient has been without significant chest pain since 11:00 morning troponins x2 since then have been negative EKG shows no acute changes to some mild  Q-wave in 3 and the T-wave inversion inferiorly but he did have an inferior MI on March 26 and can represent this. Chest x-rays negative  for pneumonia pulmonary edema pneumothorax. No significant anemia on labs electrolytes are normal troponins as mentioned above. We'll discuss with cardiology down to [facility].  Patient did have a full aspirin this morning.      I personally performed the services described in this documentation, which was scribed in my presence. The recorded information has been reviewed and is accurate.     Jeffrey Jakes, MD 11/05/12 1514  Addendum: Discussed with LV cardiology Dr. Tenny Craw. She reviewed the cath results. Also reviewed your results from here today. They feel that there is no evidence of significant coronary artery occlusion no need for stents were re\re catheterization. They believe that this may be due to collateral ischemia and therefore it adjustment of your medications and close followup with them in the clinic would be appropriate. They want you to stop your blood pressure medicine altace, for now. They will arrange for followup on Monday with the cardiology office. This plan seems to be appropriate and patient to return for any newer worse symptoms concur that there appears to be no evidence of an acute cardiac event here today.  Jeffrey Jakes, MD 11/05/12 1537

## 2012-11-06 ENCOUNTER — Emergency Department (HOSPITAL_COMMUNITY)
Admission: EM | Admit: 2012-11-06 | Discharge: 2012-11-06 | Disposition: A | Discharge status: Home / Self Care | Payer: Medicare Other | Source: Home / Self Care | Attending: Emergency Medicine | Admitting: Emergency Medicine

## 2012-11-06 ENCOUNTER — Encounter (HOSPITAL_COMMUNITY): Payer: Self-pay | Admitting: Family Medicine

## 2012-11-06 DIAGNOSIS — Z8679 Personal history of other diseases of the circulatory system: Secondary | ICD-10-CM | POA: Insufficient documentation

## 2012-11-06 DIAGNOSIS — Z87891 Personal history of nicotine dependence: Secondary | ICD-10-CM | POA: Insufficient documentation

## 2012-11-06 DIAGNOSIS — E119 Type 2 diabetes mellitus without complications: Secondary | ICD-10-CM | POA: Insufficient documentation

## 2012-11-06 DIAGNOSIS — Z87448 Personal history of other diseases of urinary system: Secondary | ICD-10-CM | POA: Insufficient documentation

## 2012-11-06 DIAGNOSIS — M1990 Unspecified osteoarthritis, unspecified site: Secondary | ICD-10-CM | POA: Insufficient documentation

## 2012-11-06 DIAGNOSIS — Z951 Presence of aortocoronary bypass graft: Secondary | ICD-10-CM | POA: Insufficient documentation

## 2012-11-06 DIAGNOSIS — R0789 Other chest pain: Secondary | ICD-10-CM | POA: Insufficient documentation

## 2012-11-06 DIAGNOSIS — I10 Essential (primary) hypertension: Secondary | ICD-10-CM | POA: Insufficient documentation

## 2012-11-06 DIAGNOSIS — Z79899 Other long term (current) drug therapy: Secondary | ICD-10-CM | POA: Insufficient documentation

## 2012-11-06 DIAGNOSIS — Z7982 Long term (current) use of aspirin: Secondary | ICD-10-CM | POA: Insufficient documentation

## 2012-11-06 DIAGNOSIS — Z8781 Personal history of (healed) traumatic fracture: Secondary | ICD-10-CM | POA: Insufficient documentation

## 2012-11-06 DIAGNOSIS — Z9861 Coronary angioplasty status: Secondary | ICD-10-CM | POA: Insufficient documentation

## 2012-11-06 DIAGNOSIS — G8929 Other chronic pain: Secondary | ICD-10-CM | POA: Insufficient documentation

## 2012-11-06 DIAGNOSIS — E785 Hyperlipidemia, unspecified: Secondary | ICD-10-CM | POA: Insufficient documentation

## 2012-11-06 DIAGNOSIS — Z8673 Personal history of transient ischemic attack (TIA), and cerebral infarction without residual deficits: Secondary | ICD-10-CM | POA: Insufficient documentation

## 2012-11-06 DIAGNOSIS — K219 Gastro-esophageal reflux disease without esophagitis: Secondary | ICD-10-CM | POA: Insufficient documentation

## 2012-11-06 DIAGNOSIS — I2510 Atherosclerotic heart disease of native coronary artery without angina pectoris: Secondary | ICD-10-CM | POA: Insufficient documentation

## 2012-11-06 DIAGNOSIS — R079 Chest pain, unspecified: Secondary | ICD-10-CM

## 2012-11-06 DIAGNOSIS — Z8701 Personal history of pneumonia (recurrent): Secondary | ICD-10-CM | POA: Insufficient documentation

## 2012-11-06 LAB — TROPONIN I: Troponin I: <0.3 ng/mL (ref <0.30)

## 2012-11-06 MED ORDER — NITROGLYCERIN 0.4 MG SL SUBL: 0.4000 mg | SUBLINGUAL_TABLET | SUBLINGUAL | Status: DC | PRN

## 2012-11-06 NOTE — ED Provider Notes (Signed)
History     CSN: [id number]  Arrival date & time 11/06/12  1640   First MD Initiated Contact with Patient 11/06/12 1642      Chief Complaint  Patient presents with  . Chest Pain    (Consider location/radiation/quality/duration/timing/severity/associated sxs/prior treatment) HPI Comments: 79 y.o. male with atherosclerotic coronary artery disease (CABG x 4 in 1989 (VG->OM1->OM2, VG->RCA, LIMA->LAD), b. 05/2010: DES to VG-OM1/OM2, DES to distal LCx. c. NSTEMI in 04/2011 - TO distal LCX stent and VG->OM2. d. 02/2012 NSTEMI DES to VG-OM1/continuation to OM2 occluded. e. inferior STEMI s/p DES to SVG-RAMUS 06/2012. f. inferolat STEMI 09/2012 s/p DES to SVG-interm), T2DM, and hypertension who was recently admitted and treated for a STEMI 09/2012.  Cardiac catheterization revealed significant in-stent stenosis, severe triple vessel disease and status post four-vessel CABG with 2 of the 4 bypass grafts patent.  He was admitted her 32/5 and discharged 4/6 after a presentation for chest pain.  He was also seen yesterday with a plan for outpatient follow-up with [facility] Card[facility] given the fact that his cardiac dz will be primarily treated with medical management.  Altace was held at that time. He points to his substernal area when asked where it hurts.  Pain is less than the pain he had yesterday, onset today while lying, described as burning, tried Tums and 325 mg ASA x2 at home PTA.  EMS gave 1 SL NTG with improvement.  Pt reports that he is almost pain free here.  No recent n/v, fevers, cough, dysuria or other complaints.    Patient is a 79 y.o. male presenting with chest pain. The history is provided by the patient.  Chest Pain Pain location:  Substernal area Pain quality: burning   Pain radiates to:  Does not radiate Pain radiates to the back: no   Pain severity:  Mild Onset quality:  Sudden Timing:  Constant Progression since onset: "almost gone" Chronicity:  Recurrent Context: not breathing    Associated symptoms: no abdominal pain, no cough, no diaphoresis, no fever, no lower extremity edema, no nausea and not vomiting   Risk factors: coronary artery disease, diabetes mellitus and hypertension   Risk factors: no prior DVT/PE     Past Medical History  Diagnosis Date  . Diabetes mellitus, type II   . Hyperlipidemia     Lipid profile in 02/2012:135, 227, 41, 49  . Arteriosclerotic cardiovascular disease (ASCVD)     a. CABG x 4 in 1989 (VG->OM1->OM2, VG->RCA, LIMA->LAD), b. 05/2010: DES to VG-OM1/OM2, DES to distal LCx. c. NSTEMI in 04/2011 - TO distal LCX stent and VG->OM2. d. 02/2012 NSTEMI DES to VG-OM1/continuation to OM2 occluded. e. inferior STEMI s/p DES to SVG-RAMUS 06/2012. f. inferolat STEMI 09/2012 s/p DES to SVG-interm.  . Hypertension   . Osteoarthritis   . CVA (cerebral infarction)     details unclear. pt reports light stroke last year involving L leg. No imaging to suggest hemorrhagic etiology  . Pneumonia   . Cervical vertebral fracture   . Chronic back pain   . Benign prostatic hypertrophy     Required urinary catheter x4 weeks for retention  . Peptic ulcer disease   . Gastroesophageal reflux disease   . Ischemic cardiomyopathy     EF 45-50% 3/32014  . DM (diabetes mellitus)     Past Surgical History  Procedure Laterality Date  . Coronary angioplasty with stent placement      2011, 02/2012  . Tonsillectomy    . Coronary artery bypass graft  Family History  Problem Relation Age of Onset  . Early death      Parents died young  . Appendicitis Mother     Pt was 57 year old  . Heart attack Father 48    History  Substance Use Topics  . Smoking status: Former Smoker -- 2.00 packs/day for 10 years    Types: Cigarettes    Quit date: 07/14/1961  . Smokeless tobacco: Current User    Types: Chew  . Alcohol Use: No      Review of Systems  Constitutional: Negative for fever and diaphoresis.  Respiratory: Negative for cough.   Cardiovascular:  Positive for chest pain.  Gastrointestinal: Negative for nausea, vomiting, abdominal pain and diarrhea.  Genitourinary: Negative for dysuria.  All other systems reviewed and are negative.    Allergies  Review of patient's allergies indicates no known allergies.  Home Medications   Current Outpatient Rx  Name  Route  Sig  Dispense  Refill  . albuterol (PROVENTIL HFA;VENTOLIN HFA) 108 (90 BASE) MCG/ACT inhaler   Inhalation   Inhale 2 puffs into the lungs every 6 (six) hours as needed. For shortness of breath         . aspirin 81 MG chewable tablet   Oral   Chew 1 tablet (81 mg total) by mouth daily.         Marland Kitchen atorvastatin (LIPITOR) 80 MG tablet   Oral   Take 1 tablet (80 mg total) by mouth daily at 6 PM.   30 tablet   3   . B Complex-Biotin-FA (B-COMPLEX PO)   Oral   Take 1 tablet by mouth daily.          . calcium carbonate (TUMS - DOSED IN MG ELEMENTAL CALCIUM) 500 MG chewable tablet   Oral   Chew 1 tablet by mouth 4 (four) times daily - after meals and at bedtime.         . carvedilol (COREG) 3.125 MG tablet   Oral   Take 1 tablet (3.125 mg total) by mouth 2 (two) times daily with a meal.   60 tablet   6   . Docusate Sodium (STOOL SOFTENER) 100 MG capsule   Oral   Take 100 mg by mouth at bedtime. Constipation         . fish oil-omega-3 fatty acids 1000 MG capsule   Oral   Take 1 g by mouth daily.         . isosorbide mononitrate (IMDUR) 30 MG 24 hr tablet               . metFORMIN (GLUCOPHAGE) 500 MG tablet   Oral   Take 1 tablet (500 mg total) by mouth 2 (two) times daily.           !!!!!!!!!!!!!!!!!!!!!!!!!!!!!!!!!!!!!!!!!!!!!!!!!  ...   . Multiple Vitamin (MULTIVITAMIN WITH MINERALS) TABS   Oral   Take 1 tablet by mouth daily.         . nitrofurantoin (MACRODANTIN) 100 MG capsule   Oral   Take 100 mg by mouth at bedtime.         . nitroGLYCERIN (NITROSTAT) 0.4 MG SL tablet   Sublingual   Place 1 tablet (0.4 mg total) under  the tongue every 5 (five) minutes x 3 doses as needed for chest pain.   25 tablet   3   . pantoprazole (PROTONIX) 40 MG tablet   Oral   Take 40 mg by mouth daily.          Marland Kitchen  potassium chloride (K-DUR,KLOR-CON) 10 MEQ tablet   Oral   Take 1 tablet (10 mEq total) by mouth daily.   30 tablet   3   . ramipril (ALTACE) 2.5 MG capsule   Oral   Take 1 capsule (2.5 mg total) by mouth daily.   30 capsule   0   . ranitidine (ZANTAC) 150 MG tablet   Oral   Take 1 tablet (150 mg total) by mouth 2 (two) times daily.   10 tablet   0   . saxagliptin HCl (ONGLYZA) 5 MG TABS tablet   Oral   Take 5 mg by mouth daily.          . sertraline (ZOLOFT) 50 MG tablet   Oral   Take 25 mg by mouth daily.         . Tamsulosin HCl (FLOMAX) 0.4 MG CAPS   Oral   Take 0.4 mg by mouth at bedtime. For urinary health         . Ticagrelor (BRILINTA) 90 MG TABS tablet   Oral   Take 1 tablet (90 mg total) by mouth 2 (two) times daily.   60 tablet   6     BP 127/69  Temp(Src) 98.2 F (36.8 C) (Oral)  Resp 14  SpO2 99%  Physical Exam  Vitals reviewed. Constitutional: He is oriented to person, place, and time. He appears well-developed and well-nourished. No distress.  HENT:  Head: Normocephalic.  Right Ear: External ear normal.  Left Ear: External ear normal.  Nose: Nose normal.  Mouth/Throat: Oropharynx is clear and moist. No oropharyngeal exudate.  Eyes: Conjunctivae and EOM are normal. Pupils are equal, round, and reactive to light.  Neck: Normal range of motion. Neck supple.  Cardiovascular: Normal rate, regular rhythm, normal heart sounds and intact distal pulses.  Exam reveals no gallop and no friction rub.   No murmur heard. Pulmonary/Chest: Effort normal and breath sounds normal.  Healed sternotomy scar  Abdominal: Soft. Bowel sounds are normal. He exhibits no distension. There is no tenderness.  Musculoskeletal: Normal range of motion. He exhibits no edema and no  tenderness.  L leg atrophic compared to R (chronic, pt reports prior stroke)  Neurological: He is alert and oriented to person, place, and time. No cranial nerve deficit.  Skin: Skin is warm and dry.  Psychiatric: He has a normal mood and affect.    ED Course  Procedures (including critical care time)  Labs Reviewed  TROPONIN I   Dg Chest 2 View  11/05/2012  *RADIOLOGY REPORT*  Clinical Data: Chest pain  CHEST - 2 VIEW  Comparison: November 04, 2012.  Findings: Sternotomy wires are noted.  Cardiomediastinal silhouette appears normal.  No acute pulmonary disease is noted.  Bony thorax is intact.  No pleural effusion or pneumothorax is noted.  IMPRESSION: No acute cardiopulmonary abnormality seen.   Original Report Authenticated By: Lupita Raider.,  M.D.     Date: 11/06/2012  Rate: 84  Rhythm: normal sinus rhythm with a PVC  QRS Axis: normal  Intervals: QT prolonged  ST/T Wave abnormalities: T wave inversions in inferior leads and in V6  Conduction Disutrbances:none  Narrative Interpretation: NSR, TWI inferiorly, no signficant change noted when compared to EKG from 11/05/12  Old EKG Reviewed: unchanged    1. Chest pain       MDM    79 y.o. male with atherosclerotic coronary artery disease (CABG x 4 in 1989 (VG->OM1->OM2, VG->RCA, LIMA->LAD), b. 05/2010: DES to  VG-OM1/OM2, DES to distal LCx. c. NSTEMI in 04/2011 - TO distal LCX stent and VG->OM2. d. 02/2012 NSTEMI DES to VG-OM1/continuation to OM2 occluded. e. inferior STEMI s/p DES to SVG-RAMUS 06/2012. f. inferolat STEMI 09/2012 s/p DES to SVG-interm), T2DM, and hypertension who was recently admitted and treated for a STEMI 09/2012.  Cardiac catheterization revealed significant in-stent stenosis, severe triple vessel disease and status post four-vessel CABG with 2 of the 4 bypass grafts patent.  He was admitted her 22/5 and discharged 4/6 after a presentation for chest pain.  He was also seen yesterday for a CC of CP and was discharged  from [facility] with a plan for outpatient follow-up with [facility] given the fact that his cardiac dz will be primarily treated with medical management.  Pt was given instructions to hold Altace. He points to his substernal area when asked where it hurts.  Pain is less than the pain he had yesterday, onset today while lying, described as burning, tried Tums and 325 mg ASA x2 at home PTA.  EMS gave 1 SL NTG with improvement.  Pt reports that he is almost pain free here.  No recent n/v, fevers, cough, dysuria or other complaints.  AFVSS, well appearing.  Lungs clear.  Abd soft, NT.  Doubt GI cause as no no abd TTP, no n/v.  Given his workup yesterday including unremarkable CBC, BMP, CXR and trop x2, will eval with EKG, single trop and plan for consult to Cardiology.  NTG PRN for symptomatic relief, but CP free currently.   8:13 PM Pt has remained CP free during his stay in the ED.  Case discussed with on-call Provider for [facility], Dr. Verdi Callas, and the decision was made to increase the patient's IMDUR.  The patient was counseled on IMDUR and states that he was taken off of this medication.  Due to conflicting information in the chart and by the pt, he was given clear instructions to resume the medication if he had indeed stopped it and to call his Cardiologist's office if he gets home and finds out that he has been taking it.  Pt was also counseled to use his nitroglycerin as prescribed.  Return precautions reviewed.  It is felt the pt is stable for d/c with close Cards f/u on Monday as previously scheduled.  All questions answered and patient expressed understanding.  Disposition: Discharge  Condition: Good  Follow-up Information   Follow up with Follow up with your Cardiologist on Monday as already planned.      Pt seen in conjunction with my attending, Dr. Blinda Leatherwood.   Oleh Genin, MD PGY-II [facility] Emergency Medicine Resident      Oleh Genin, MD 11/07/12 0330

## 2012-11-06 NOTE — ED Notes (Signed)
Per pt and EMS, chest pain that has been intermittent since yesterday. Was seen at [facility] and told if chest pain reacurred to come here. Started having chest pain today. sts took 324 ASA and 1 nitro in route. Pt chest pain free currently. IV established. Pt significant cardiac hx

## 2012-11-06 NOTE — ED Notes (Signed)
Patient waiting on daughter to call back regarding ride home. Once it is confirmed that patient has transportation home, patient will wait on ride in lobby

## 2012-11-07 NOTE — ED Provider Notes (Signed)
I saw and evaluated the patient, reviewed the resident's note and I agree with the findings and plan, as well as EKG interpretation.  Patient presents to the ER today with chest pain. Patient has a history of angina secondary to collaterals. He is not a candidate for any interventions. The pain is experiencing is somewhat stable, as it is frequent for him. Discussed with cardiology and it was felt appropriate to increase the patient's antianginals and followup as an outpatient.  Gilda Crease, MD 11/07/12 1626

## 2012-11-08 ENCOUNTER — Inpatient Hospital Stay (HOSPITAL_COMMUNITY)
Admission: AD | Admit: 2012-11-08 | Discharge: 2012-11-11 | DRG: 287 | Disposition: A | Discharge status: Home / Self Care | Payer: Medicare Other | Source: Ambulatory Visit | Attending: Cardiology | Admitting: Cardiology

## 2012-11-08 ENCOUNTER — Encounter (HOSPITAL_COMMUNITY): Payer: Self-pay | Admitting: *Deleted

## 2012-11-08 ENCOUNTER — Telehealth: Payer: Self-pay | Admitting: Internal Medicine

## 2012-11-08 ENCOUNTER — Ambulatory Visit (INDEPENDENT_AMBULATORY_CARE_PROVIDER_SITE_OTHER): Payer: Medicare Other | Admitting: Internal Medicine

## 2012-11-08 VITALS — BP 92/59 | HR 90 | Wt 170.0 lb

## 2012-11-08 DIAGNOSIS — Z87891 Personal history of nicotine dependence: Secondary | ICD-10-CM

## 2012-11-08 DIAGNOSIS — E119 Type 2 diabetes mellitus without complications: Secondary | ICD-10-CM

## 2012-11-08 DIAGNOSIS — Z79899 Other long term (current) drug therapy: Secondary | ICD-10-CM

## 2012-11-08 DIAGNOSIS — D649 Anemia, unspecified: Secondary | ICD-10-CM | POA: Diagnosis present

## 2012-11-08 DIAGNOSIS — I25810 Atherosclerosis of coronary artery bypass graft(s) without angina pectoris: Principal | ICD-10-CM | POA: Diagnosis present

## 2012-11-08 DIAGNOSIS — M549 Dorsalgia, unspecified: Secondary | ICD-10-CM | POA: Diagnosis present

## 2012-11-08 DIAGNOSIS — Z7982 Long term (current) use of aspirin: Secondary | ICD-10-CM

## 2012-11-08 DIAGNOSIS — I2510 Atherosclerotic heart disease of native coronary artery without angina pectoris: Secondary | ICD-10-CM

## 2012-11-08 DIAGNOSIS — I7090 Unspecified atherosclerosis: Secondary | ICD-10-CM

## 2012-11-08 DIAGNOSIS — E785 Hyperlipidemia, unspecified: Secondary | ICD-10-CM

## 2012-11-08 DIAGNOSIS — G8929 Other chronic pain: Secondary | ICD-10-CM | POA: Diagnosis present

## 2012-11-08 DIAGNOSIS — I200 Unstable angina: Secondary | ICD-10-CM | POA: Diagnosis present

## 2012-11-08 DIAGNOSIS — I2589 Other forms of chronic ischemic heart disease: Secondary | ICD-10-CM

## 2012-11-08 DIAGNOSIS — I209 Angina pectoris, unspecified: Secondary | ICD-10-CM | POA: Diagnosis present

## 2012-11-08 DIAGNOSIS — Z8673 Personal history of transient ischemic attack (TIA), and cerebral infarction without residual deficits: Secondary | ICD-10-CM

## 2012-11-08 DIAGNOSIS — I679 Cerebrovascular disease, unspecified: Secondary | ICD-10-CM

## 2012-11-08 DIAGNOSIS — K219 Gastro-esophageal reflux disease without esophagitis: Secondary | ICD-10-CM | POA: Diagnosis present

## 2012-11-08 DIAGNOSIS — M1990 Unspecified osteoarthritis, unspecified site: Secondary | ICD-10-CM | POA: Diagnosis present

## 2012-11-08 DIAGNOSIS — I10 Essential (primary) hypertension: Secondary | ICD-10-CM

## 2012-11-08 DIAGNOSIS — R079 Chest pain, unspecified: Secondary | ICD-10-CM

## 2012-11-08 DIAGNOSIS — Z9861 Coronary angioplasty status: Secondary | ICD-10-CM

## 2012-11-08 DIAGNOSIS — I252 Old myocardial infarction: Secondary | ICD-10-CM

## 2012-11-08 DIAGNOSIS — I255 Ischemic cardiomyopathy: Secondary | ICD-10-CM | POA: Diagnosis present

## 2012-11-08 LAB — COMPREHENSIVE METABOLIC PANEL
AST: 22 U/L (ref 0–37)
Albumin: 3.8 g/dL (ref 3.5–5.2)
BUN: 15 mg/dL (ref 6–23)
Chloride: 98 mEq/L (ref 96–112)
Creatinine, Ser: 0.93 mg/dL (ref 0.50–1.35)
Potassium: 4 mEq/L (ref 3.5–5.1)
Total Bilirubin: 0.2 mg/dL — ABNORMAL LOW (ref 0.3–1.2)
Total Protein: 6.9 g/dL (ref 6.0–8.3)

## 2012-11-08 LAB — CBC WITH DIFFERENTIAL/PLATELET
Basophils Absolute: 0 10*3/uL (ref 0.0–0.1)
Eosinophils Absolute: 0.4 10*3/uL (ref 0.0–0.7)
Eosinophils Relative: 5 % (ref 0–5)
MCH: 28.5 pg (ref 26.0–34.0)
MCV: 84 fL (ref 78.0–100.0)
Monocytes Absolute: 0.7 10*3/uL (ref 0.1–1.0)
Platelets: 188 10*3/uL (ref 150–400)
RDW: 14.6 % (ref 11.5–15.5)

## 2012-11-08 LAB — TROPONIN I
Troponin I: <0.3 ng/mL (ref <0.30)
Troponin I: <0.3 ng/mL (ref <0.30)

## 2012-11-08 LAB — APTT: aPTT: 26 seconds (ref 24–37)

## 2012-11-08 LAB — PROTIME-INR
INR: 0.98 (ref 0.00–1.49)
Prothrombin Time: 12.9 seconds (ref 11.6–15.2)

## 2012-11-08 LAB — GLUCOSE, CAPILLARY: Glucose-Capillary: 139 mg/dL — ABNORMAL HIGH (ref 70–99)

## 2012-11-08 MED ORDER — SODIUM CHLORIDE 0.9 % IV SOLN: INTRAVENOUS | Status: DC

## 2012-11-08 MED ORDER — DOCUSATE SODIUM 100 MG PO TABS: 100.0000 mg | ORAL_TABLET | Freq: Every day | ORAL | Status: DC

## 2012-11-08 MED ORDER — ACETAMINOPHEN 325 MG PO TABS: 650.0000 mg | ORAL_TABLET | ORAL | Status: DC | PRN

## 2012-11-08 MED ORDER — METFORMIN HCL 500 MG PO TABS: 500.0000 mg | ORAL_TABLET | Freq: Two times a day (BID) | ORAL | Status: DC

## 2012-11-08 MED ORDER — SODIUM CHLORIDE 0.9 % IJ SOLN: 3.0000 mL | INTRAMUSCULAR | Status: DC | PRN

## 2012-11-08 MED ORDER — HEPARIN BOLUS VIA INFUSION
4000.0000 [IU] | Freq: Once | INTRAVENOUS | Status: AC
  Administered 2012-11-08: 4000 [IU] via INTRAVENOUS
  Filled 2012-11-08: qty 4000

## 2012-11-08 MED ORDER — SODIUM CHLORIDE 0.9 % IV SOLN: 250.0000 mL | INTRAVENOUS | Status: DC | PRN

## 2012-11-08 MED ORDER — NITROFURANTOIN MACROCRYSTAL 100 MG PO CAPS
100.0000 mg | ORAL_CAPSULE | Freq: Every day | ORAL | Status: DC
  Administered 2012-11-08 – 2012-11-10 (×3): 100 mg via ORAL
  Filled 2012-11-08 (×5): qty 1

## 2012-11-08 MED ORDER — TICAGRELOR 90 MG PO TABS
90.0000 mg | ORAL_TABLET | Freq: Two times a day (BID) | ORAL | Status: DC
  Administered 2012-11-08 – 2012-11-11 (×6): 90 mg via ORAL
  Filled 2012-11-08 (×10): qty 1

## 2012-11-08 MED ORDER — OMEGA-3 FATTY ACIDS 1000 MG PO CAPS: 1.0000 g | ORAL_CAPSULE | Freq: Every day | ORAL | Status: DC

## 2012-11-08 MED ORDER — INSULIN ASPART 100 UNIT/ML ~~LOC~~ SOLN
0.0000 [IU] | Freq: Three times a day (TID) | SUBCUTANEOUS | Status: DC
  Administered 2012-11-08: 3 [IU] via SUBCUTANEOUS
  Administered 2012-11-09 – 2012-11-10 (×2): 7 [IU] via SUBCUTANEOUS
  Administered 2012-11-10 – 2012-11-11 (×3): 4 [IU] via SUBCUTANEOUS

## 2012-11-08 MED ORDER — ONDANSETRON HCL 4 MG/2ML IJ SOLN: 4.0000 mg | Freq: Four times a day (QID) | INTRAMUSCULAR | Status: DC | PRN

## 2012-11-08 MED ORDER — CALCIUM CARBONATE ANTACID 500 MG PO CHEW
1.0000 | CHEWABLE_TABLET | Freq: Three times a day (TID) | ORAL | Status: DC
  Administered 2012-11-08 – 2012-11-11 (×8): 200 mg via ORAL
  Filled 2012-11-08 (×15): qty 1

## 2012-11-08 MED ORDER — ASPIRIN 300 MG RE SUPP
300.0000 mg | RECTAL | Status: AC
  Filled 2012-11-08: qty 1

## 2012-11-08 MED ORDER — ASPIRIN 325 MG PO TABS: 325.0000 mg | ORAL_TABLET | Freq: Every day | ORAL | Status: DC

## 2012-11-08 MED ORDER — DOCUSATE SODIUM 100 MG PO CAPS
100.0000 mg | ORAL_CAPSULE | Freq: Every day | ORAL | Status: DC
  Administered 2012-11-08 – 2012-11-10 (×3): 100 mg via ORAL
  Filled 2012-11-08 (×4): qty 1

## 2012-11-08 MED ORDER — HEPARIN (PORCINE) IN NACL 100-0.45 UNIT/ML-% IJ SOLN
1200.0000 [IU]/h | INTRAMUSCULAR | Status: DC
  Administered 2012-11-08: 900 [IU]/h via INTRAVENOUS
  Filled 2012-11-08 (×2): qty 250

## 2012-11-08 MED ORDER — ADULT MULTIVITAMIN W/MINERALS CH
1.0000 | ORAL_TABLET | Freq: Every day | ORAL | Status: DC
  Administered 2012-11-10 – 2012-11-11 (×2): 1 via ORAL
  Filled 2012-11-08 (×2): qty 1

## 2012-11-08 MED ORDER — ISOSORBIDE MONONITRATE ER 30 MG PO TB24
30.0000 mg | ORAL_TABLET | Freq: Every day | ORAL | Status: DC
  Administered 2012-11-09 – 2012-11-11 (×3): 30 mg via ORAL
  Filled 2012-11-08 (×3): qty 1

## 2012-11-08 MED ORDER — OMEGA-3-ACID ETHYL ESTERS 1 G PO CAPS
1.0000 g | ORAL_CAPSULE | Freq: Every day | ORAL | Status: DC
  Administered 2012-11-10 – 2012-11-11 (×2): 1 g via ORAL
  Filled 2012-11-08 (×2): qty 1

## 2012-11-08 MED ORDER — LINAGLIPTIN 5 MG PO TABS
5.0000 mg | ORAL_TABLET | Freq: Every day | ORAL | Status: DC
  Administered 2012-11-09 – 2012-11-11 (×3): 5 mg via ORAL
  Filled 2012-11-08 (×3): qty 1

## 2012-11-08 MED ORDER — SODIUM CHLORIDE 0.9 % IJ SOLN
3.0000 mL | Freq: Two times a day (BID) | INTRAMUSCULAR | Status: DC
  Administered 2012-11-08 – 2012-11-10 (×4): 3 mL via INTRAVENOUS

## 2012-11-08 MED ORDER — ATORVASTATIN CALCIUM 80 MG PO TABS
80.0000 mg | ORAL_TABLET | Freq: Every day | ORAL | Status: DC
  Administered 2012-11-08 – 2012-11-10 (×3): 80 mg via ORAL
  Filled 2012-11-08: qty 1
  Filled 2012-11-08: qty 2
  Filled 2012-11-08 (×2): qty 1

## 2012-11-08 MED ORDER — ASPIRIN EC 81 MG PO TBEC: 81.0000 mg | DELAYED_RELEASE_TABLET | Freq: Every day | ORAL | Status: DC

## 2012-11-08 MED ORDER — ONDANSETRON HCL 4 MG PO TABS: 4.0000 mg | ORAL_TABLET | Freq: Four times a day (QID) | ORAL | Status: DC | PRN

## 2012-11-08 MED ORDER — CARVEDILOL 3.125 MG PO TABS
3.1250 mg | ORAL_TABLET | Freq: Two times a day (BID) | ORAL | Status: DC
  Administered 2012-11-08 – 2012-11-11 (×6): 3.125 mg via ORAL
  Filled 2012-11-08 (×9): qty 1

## 2012-11-08 MED ORDER — ASPIRIN 81 MG PO CHEW
324.0000 mg | CHEWABLE_TABLET | ORAL | Status: AC
  Administered 2012-11-08: 324 mg via ORAL
  Filled 2012-11-08: qty 4

## 2012-11-08 MED ORDER — ASPIRIN EC 81 MG PO TBEC
81.0000 mg | DELAYED_RELEASE_TABLET | Freq: Every day | ORAL | Status: DC
  Administered 2012-11-10 – 2012-11-11 (×2): 81 mg via ORAL
  Filled 2012-11-08 (×2): qty 1

## 2012-11-08 MED ORDER — ZOLPIDEM TARTRATE 5 MG PO TABS: 5.0000 mg | ORAL_TABLET | Freq: Every evening | ORAL | Status: DC | PRN

## 2012-11-08 MED ORDER — ALBUTEROL SULFATE HFA 108 (90 BASE) MCG/ACT IN AERS: 2.0000 | INHALATION_SPRAY | Freq: Four times a day (QID) | RESPIRATORY_TRACT | Status: DC | PRN

## 2012-11-08 MED ORDER — SERTRALINE HCL 25 MG PO TABS
25.0000 mg | ORAL_TABLET | Freq: Every day | ORAL | Status: DC
  Administered 2012-11-09 – 2012-11-11 (×3): 25 mg via ORAL
  Filled 2012-11-08 (×3): qty 1

## 2012-11-08 MED ORDER — NITROGLYCERIN 0.4 MG SL SUBL: 0.4000 mg | SUBLINGUAL_TABLET | SUBLINGUAL | Status: DC | PRN

## 2012-11-08 MED ORDER — POTASSIUM CHLORIDE CRYS ER 10 MEQ PO TBCR
10.0000 meq | EXTENDED_RELEASE_TABLET | Freq: Every day | ORAL | Status: DC
  Administered 2012-11-10 – 2012-11-11 (×2): 10 meq via ORAL
  Filled 2012-11-08 (×2): qty 1

## 2012-11-08 MED ORDER — ASPIRIN 81 MG PO CHEW
324.0000 mg | CHEWABLE_TABLET | ORAL | Status: AC
  Administered 2012-11-09: 324 mg via ORAL
  Filled 2012-11-08: qty 4

## 2012-11-08 MED ORDER — PANTOPRAZOLE SODIUM 40 MG PO TBEC
40.0000 mg | DELAYED_RELEASE_TABLET | Freq: Every day | ORAL | Status: DC
  Administered 2012-11-09 – 2012-11-11 (×3): 40 mg via ORAL
  Filled 2012-11-08 (×3): qty 1

## 2012-11-08 MED ORDER — SODIUM CHLORIDE 0.9 % IJ SOLN: 3.0000 mL | Freq: Two times a day (BID) | INTRAMUSCULAR | Status: DC

## 2012-11-08 MED ORDER — INSULIN ASPART 100 UNIT/ML ~~LOC~~ SOLN: 0.0000 [IU] | Freq: Every day | SUBCUTANEOUS | Status: DC

## 2012-11-08 MED ORDER — SODIUM CHLORIDE 0.9 % IV SOLN
INTRAVENOUS | Status: DC
  Administered 2012-11-09: 05:00:00 via INTRAVENOUS

## 2012-11-08 MED ORDER — MORPHINE SULFATE 2 MG/ML IJ SOLN: 2.0000 mg | INTRAMUSCULAR | Status: DC | PRN

## 2012-11-08 NOTE — Progress Notes (Signed)
Patient a direct admit from [place] cardiology,had c/o chest discomfort this am.Has history of NSTEMI. Alert,and oriented no pain or discomfort at this time.Placed on telemetry normal sinus rhythm heart rate 82.Vital signs stable.

## 2012-11-08 NOTE — Consult Note (Signed)
Patient ID: Jeffrey [name] MRN: [id number], DOB/AGE: 11/12/1928   Admit date: 11/08/2012 Date of Consult: @TODAY@  Primary Physician: FUSCO,LAWRENCE J., MD Primary Cardiologist:[place]   Problem List: Past Medical History  Diagnosis Date  . Diabetes mellitus, type II   . Hyperlipidemia     Lipid profile in 02/2012:135, 227, 41, 49  . Arteriosclerotic cardiovascular disease (ASCVD)     a. CABG x 4 in 1989 (VG->OM1->OM2, VG->RCA, LIMA->LAD), b. 05/2010: DES to VG-OM1/OM2, DES to distal LCx. c. NSTEMI in 04/2011 - TO distal LCX stent and VG->OM2. d. 02/2012 NSTEMI DES to VG-OM1/continuation to OM2 occluded. e. inferior STEMI s/p DES to SVG-RAMUS 06/2012. f. inferolat STEMI 09/2012 s/p DES to SVG-interm.  . Hypertension   . Osteoarthritis   . CVA (cerebral infarction)     details unclear. pt reports light stroke last year involving L leg. No imaging to suggest hemorrhagic etiology  . Pneumonia   . Cervical vertebral fracture   . Chronic back pain   . Benign prostatic hypertrophy     Required urinary catheter x4 weeks for retention  . Peptic ulcer disease   . Gastroesophageal reflux disease   . Ischemic cardiomyopathy     EF 45-50% 3/32014  . DM (diabetes mellitus)     Past Surgical History  Procedure Laterality Date  . Coronary angioplasty with stent placement      2011, 02/2012  . Tonsillectomy    . Coronary artery bypass graft       Allergies:  Allergies  Allergen Reactions  . Heparin     HPI: Patient is an 79 yo with a history of CAD.  He presented to cardiology clinic today for f/u care 79 y.o. male with atherosclerotic coronary artery disease, diabetes, and hypertension who was recently admitted and treated for a STEMI at [facility] on 3/36/2014. Cardiac catheterization revealed significant in-stent stenosis in SVG to intermedius, severe triple vessel disease and status post four-vessel CABG with 2 of the 4 bypass grafts patent. He underwent PTCA/Stent to SVG.  He represented  to [facility] with CP Was admitted. R/O for MI CP felt to be noncardiac and he was sent home. Since then he presented to [facility] ER on Friday with CP  I heard about this visit.  EKG was not acute.  I recomm stopping ramipril to allow BP to increase since he had signif collaterals.  The patient presented then to [place] ER on Saturday.  Told to continue meds, restart imdur (not clear when discontinued).  Today the patient says he is continuing to get chest pressure with activity.  No pain while resting.  He gets very SOB with activity.  He says that with the recent intervention he never felt better.  Lives alone  Says he cannot go on like this.    Inpatient Medications:  . aspirin  324 mg Oral NOW   Or  . aspirin  300 mg Rectal NOW  . [START ON 11/09/2012] aspirin  324 mg Oral Pre-Cath  . [START ON 11/09/2012] aspirin EC  81 mg Oral Daily  . aspirin  325 mg Oral Daily  . atorvastatin  80 mg Oral q1800  . calcium carbonate  1 tablet Oral TID PC & HS  . carvedilol  3.125 mg Oral BID WC  . fish oil-omega-3 fatty acids  1 g Oral Daily  . isosorbide mononitrate  30 mg Oral Daily  . linagliptin  5 mg Oral Daily  . multivitamin with minerals  1 tablet Oral Daily  .   nitrofurantoin  100 mg Oral QHS  . pantoprazole  40 mg Oral Daily  . potassium chloride  10 mEq Oral Daily  . sertraline  25 mg Oral Daily  . sodium chloride  3 mL Intravenous Q12H  . sodium chloride  3 mL Intravenous Q12H  . Ticagrelor  90 mg Oral BID    Family History  Problem Relation Age of Onset  . Early death      Parents died young  . Appendicitis Mother     Pt was 3 year old  . Heart attack Father 40     History   Social History  . Marital Status: Divorced    Spouse Name: N/A    Number of Children: N/A  . Years of Education: N/A   Occupational History  . Retired     Farming   Social History Main Topics  . Smoking status: Former Smoker -- 2.00 packs/day for 10 years    Types: Cigarettes    Quit date: 07/14/1961   . Smokeless tobacco: Current User    Types: Chew  . Alcohol Use: No  . Drug Use: No  . Sexually Active: No   Other Topics Concern  . Not on file   Social History Narrative   ** Merged History Encounter **...   He was raised by his uncle.  His mother deceased when   he was 3 year old with appendicitis. Father deceased from an MI at age   40.  He has 1 sister, but he does not know her health status as they   have been separated.            Review of Systems:  All other systems reviewed and are otherwise negative except as noted above.  Physical Exam: Filed Vitals:   11/08/12 1605  BP: 116/64  Pulse: 82  Temp: 98 F (36.7 C)  Resp: 20   No intake or output data in the 24 hours ending 11/08/12 1619  General:  Pale 79 yo in NAD Head: Normocephalic, atraumatic, sclera non-icteric Neck: Negative for carotid bruits. JVP not elevated. Lungs: Clear bilaterally to auscultation without wheezes, rales, or rhonchi. Breathing is unlabored. Heart: RRR with S1 S2. No murmurs, rubs, or gallops appreciated. Abdomen: Soft, non-tender, non-distended with normoactive bowel sounds. No hepatomegaly. No rebound/guarding. No obvious abdominal masses. Msk:  Strength and tone appears normal for age. Extremities: No clubbing, cyanosis or edema.  Distal pedal pulses are 2+ and equal bilaterally. Neuro: Alert and oriented X 3. Moves all extremities spontaneously. Psych:  Responds to questions appropriately with a normal affect.  Labs: No results found for this or any previous visit (from the past 24 hour(s)).  Radiology/Studies: Dg Chest 2 View  11/05/2012  *RADIOLOGY REPORT*  Clinical Data: Chest pain  CHEST - 2 VIEW  Comparison: November 04, 2012.  Findings: Sternotomy wires are noted.  Cardiomediastinal silhouette appears normal.  No acute pulmonary disease is noted.  Bony thorax is intact.  No pleural effusion or pneumothorax is noted.  IMPRESSION: No acute cardiopulmonary abnormality seen.    Original Report Authenticated By: Delford Green Jr.,  M.D.     EKG:  SR 90 bpm.  IWMI.  T wave inversion II, III, AVF.  V4 to V6.  Old.  ASSESSMENT AND PLAN:   Patient is an 79 yo with severe CAD>  Now with continued angina.   Exertional.   I have reviewed his history with C McAlhany who performed the last intervention.  Although instent   problems are unlikely with his continued pain and multiple presentations to ER he feels a repeat cath to redefine anatomy is indicated.   Will recomm admission to hosp tonight.  Start heparin.  IV fluids in AM  Plan for tx to [place] in AM for LHC.  Patient is amenable  Wants to find out what is going on Continue other meds for now.   Watch BP closely  2.  HL  Continue lipitor  3.  DM  Hold  Metformin.  Follow glucose.       Signed, Anjelo Pullman 11/08/2012, 4:19 PM   

## 2012-11-08 NOTE — Progress Notes (Signed)
Patient is not allergic to heparin per chart review & patient report.  Heparin allergy was entered in error (discussed with M. Madilyn Fireman who entered allergy) & has been deleted from patient record.  Continue with heparin drip as ordered.   Junita Push, PharmD, BCPS 11/08/2012@8 :16 PM

## 2012-11-08 NOTE — Progress Notes (Signed)
ANTICOAGULATION CONSULT NOTE - Initial Consult  Pharmacy Consult for Heparin Indication: NSTEMI  Allergies  Allergen Reactions  . Heparin     Patient Measurements: Height: 5\' 10"  (177.8 cm) Weight: 169 lb 15.6 oz (77.1 kg) IBW/kg (Calculated) : 73  Vital Signs: Temp: 98 F (36.7 C) (04/28 1605) Temp src: Oral (04/28 1605) BP: 116/64 mmHg (04/28 1605) Pulse Rate: 82 (04/28 1605)  Labs:  Recent Labs  11/06/12 1723  TROPONINI <0.30    Estimated Creatinine Clearance: 70.4 ml/min (by C-G formula based on Cr of 0.85).   Medical History: Past Medical History  Diagnosis Date  . Diabetes mellitus, type II   . Hyperlipidemia     Lipid profile in 02/2012:135, 227, 41, 49  . Arteriosclerotic cardiovascular disease (ASCVD)     a. CABG x 4 in 1989 (VG->OM1->OM2, VG->RCA, LIMA->LAD), b. 05/2010: DES to VG-OM1/OM2, DES to distal LCx. c. NSTEMI in 04/2011 - TO distal LCX stent and VG->OM2. d. 02/2012 NSTEMI DES to VG-OM1/continuation to OM2 occluded. e. inferior STEMI s/p DES to SVG-RAMUS 06/2012. f. inferolat STEMI 09/2012 s/p DES to SVG-interm.  . Hypertension   . Osteoarthritis   . CVA (cerebral infarction)     details unclear. pt reports light stroke last year involving L leg. No imaging to suggest hemorrhagic etiology  . Pneumonia   . Cervical vertebral fracture   . Chronic back pain   . Benign prostatic hypertrophy     Required urinary catheter x4 weeks for retention  . Peptic ulcer disease   . Gastroesophageal reflux disease   . Ischemic cardiomyopathy     EF 45-50% 3/32014  . DM (diabetes mellitus)     Medications:  Scheduled:  . aspirin  324 mg Oral NOW   Or  . aspirin  300 mg Rectal NOW  . [START ON 11/09/2012] aspirin  324 mg Oral Pre-Cath  . [START ON 11/09/2012] aspirin EC  81 mg Oral Daily  . atorvastatin  80 mg Oral q1800  . calcium carbonate  1 tablet Oral TID PC & HS  . carvedilol  3.125 mg Oral BID WC  . docusate sodium  100 mg Oral QHS  . insulin  aspart  0-20 Units Subcutaneous TID WC  . insulin aspart  0-5 Units Subcutaneous QHS  . isosorbide mononitrate  30 mg Oral Daily  . linagliptin  5 mg Oral Daily  . multivitamin with minerals  1 tablet Oral Daily  . nitrofurantoin  100 mg Oral QHS  . omega-3 acid ethyl esters  1 g Oral Daily  . pantoprazole  40 mg Oral Daily  . potassium chloride  10 mEq Oral Daily  . sertraline  25 mg Oral Daily  . sodium chloride  3 mL Intravenous Q12H  . sodium chloride  3 mL Intravenous Q12H  . Ticagrelor  90 mg Oral BID  . [DISCONTINUED] aspirin  325 mg Oral Daily  . [DISCONTINUED] Docusate Sodium  100 mg Oral QHS  . [DISCONTINUED] fish oil-omega-3 fatty acids  1 g Oral Daily  . [DISCONTINUED] metFORMIN  500 mg Oral BID    Assessment: 79 yo M with history of severe CAD admitted with ongoing chest pain.  Plans for cardiac cath on Tuesday at [facility].  No history of bleeding noted. CBC from office visit today reviewed.   Goal of Therapy:  Heparin level 0.3-0.7 units/ml Monitor platelets by anticoagulation protocol: Yes   Plan:  Give 4000 units bolus x 1 Start heparin infusion at 900 units/hr Check anti-Xa  level in 8 hours and daily while on heparin Continue to monitor H&H and platelets  Elson Clan 11/08/2012,4:32 PM

## 2012-11-08 NOTE — Progress Notes (Signed)
Patient allergic to heparin spoke with Junita Push [facility]  about heparin drip ,informed me to continue with drip at this time.

## 2012-11-08 NOTE — Progress Notes (Signed)
See H&P in progress note section.

## 2012-11-08 NOTE — Patient Instructions (Addendum)
Admit to [facility]

## 2012-11-08 NOTE — Telephone Encounter (Signed)
Would like to speak with nurse regarding patient's visit. / tgs

## 2012-11-08 NOTE — H&P (Signed)
[facility] History and Physical  Jeffrey [name] ZDG:[id number] DOB: Aug 11, 1928 DOA: 11/08/2012  Referring physician: Tenny Craw PCP: [name], MD  Specialists:   Chief Complaint: chest pain  HPI: Jeffrey [name] is a 79 y.o. male with pmhx including HTN, CAD, stroke, ischemic cardiomyopathy presents to room 335 from Dr. Tenny Craw office with cc chest pain. Pt providing information. States having intermittent cp for last 3 days. Went to ED on 10/06/12 treated and discharged as pain felt to be non-cardiac. On 10/06/12 pt admitted and treated for STEMI at [facility]. Cardiac catheterization revealed significant in-stent stenosis, severe triple vessel disease and status post four-vessel CABG with 2 of the 4 bypass grafts patent. Pt reports cp has not subsided. States pain is epigastric and "feels like indigestion". States he took tums without improvement.  Denies radiation of pain. Denies nausea/vomiting/sob. Rates pain 6/10 at worst and 4/10 at best. He reports having several stents and usually gets better after each. Reports that since most recent stent he has just gotten weaker and weaker.  [facility] asked to admit and [facility] per Dr. Tenny Craw. Symptoms came on suddenly, have persisted.     Review of Systems: The patient denies anorexia, fever, weight loss,, vision loss, decreased hearing, hoarseness,  syncope, dyspnea on exertion, peripheral edema, balance deficits, hemoptysis, abdominal pain, melena, hematochezia,  incontinence, genital sores,  suspicious skin lesions, transient blindness, difficulty walking, depression, unusual weight change, abnormal bleeding, enlarged lymph nodes, angioedema, and breast masses.    Past Medical History  Diagnosis Date  . Diabetes mellitus, type II   . Hyperlipidemia     Lipid profile in 02/2012:135, 227, 41, 49  . Arteriosclerotic cardiovascular disease (ASCVD)     a. CABG x 4 in 1989 (VG->OM1->OM2, VG->RCA, LIMA->LAD), b. 05/2010:  DES to VG-OM1/OM2, DES to distal LCx. c. NSTEMI in 04/2011 - TO distal LCX stent and VG->OM2. d. 02/2012 NSTEMI DES to VG-OM1/continuation to OM2 occluded. e. inferior STEMI s/p DES to SVG-RAMUS 06/2012. f. inferolat STEMI 09/2012 s/p DES to SVG-interm.  . Hypertension   . Osteoarthritis   . CVA (cerebral infarction)     details unclear. pt reports light stroke last year involving L leg. No imaging to suggest hemorrhagic etiology  . Pneumonia   . Cervical vertebral fracture   . Chronic back pain   . Benign prostatic hypertrophy     Required urinary catheter x4 weeks for retention  . Peptic ulcer disease   . Gastroesophageal reflux disease   . Ischemic cardiomyopathy     EF 45-50% 3/32014  . DM (diabetes mellitus)    Past Surgical History  Procedure Laterality Date  . Coronary angioplasty with stent placement      2011, 02/2012  . Tonsillectomy    . Coronary artery bypass graft     Social History:  reports that he quit smoking about 51 years ago. His smoking use included Cigarettes. He has a 20 pack-year smoking history. His smokeless tobacco use includes Chew. He reports that he does not drink alcohol or use illicit drugs. Pt divorced, retired lives alone. Independent with ADL's Allergies  Allergen Reactions  . Heparin     Family History  Problem Relation Age of Onset  . Early death      Parents died young  . Appendicitis Mother     Pt was 56 year old  . Heart attack Father 40     Prior to Admission medications   Medication  Sig Start Date End Date Taking? Authorizing Provider  albuterol (PROVENTIL HFA;VENTOLIN HFA) 108 (90 BASE) MCG/ACT inhaler Inhale 2 puffs into the lungs every 6 (six) hours as needed. For shortness of breath    Historical Provider, MD  aspirin 325 MG tablet Take 325 mg by mouth daily.    Historical Provider, MD  atorvastatin (LIPITOR) 80 MG tablet Take 1 tablet (80 mg total) by mouth daily at 6 PM. 06/21/12   Roger A Arguello, PA-C  B Complex-Biotin-FA  (B-COMPLEX PO) Take 1 tablet by mouth daily.     Historical Provider, MD  calcium carbonate (TUMS - DOSED IN MG ELEMENTAL CALCIUM) 500 MG chewable tablet Chew 1 tablet by mouth 4 (four) times daily - after meals and at bedtime.    Historical Provider, MD  carvedilol (COREG) 3.125 MG tablet Take 1 tablet (3.125 mg total) by mouth 2 (two) times daily with a meal. 07/26/12   Kathlen Brunswick, MD  COD LIVER OIL PO Take 1 tablet by mouth daily.    Historical Provider, MD  Docusate Sodium (STOOL SOFTENER) 100 MG capsule Take 100 mg by mouth at bedtime. Constipation    Historical Provider, MD  fish oil-omega-3 fatty acids 1000 MG capsule Take 1 g by mouth daily.    Historical Provider, MD  isosorbide mononitrate (IMDUR) 30 MG 24 hr tablet Take 30 mg by mouth daily.  09/16/12   Historical Provider, MD  linagliptin (TRADJENTA) 5 MG TABS tablet Take 5 mg by mouth daily.    Historical Provider, MD  metFORMIN (GLUCOPHAGE) 500 MG tablet Take 500 mg by mouth 2 (two) times daily. MAY TAKE 1 1/2 TAB IF NEEDED PT ADJUST THIS MED HIMSELF HE STATES 10/09/12   Dayna N Dunn, PA-C  Misc Natural Products (ALLERGY RELEAF SYSTEM PO) Take 1 tablet by mouth as needed.    Historical Provider, MD  Multiple Vitamin (MULTIVITAMIN WITH MINERALS) TABS Take 1 tablet by mouth daily.    Historical Provider, MD  nitrofurantoin (MACRODANTIN) 100 MG capsule Take 100 mg by mouth at bedtime.    Historical Provider, MD  nitroGLYCERIN (NITROSTAT) 0.4 MG SL tablet Place 1 tablet (0.4 mg total) under the tongue every 5 (five) minutes x 3 doses as needed for chest pain. 03/12/12 03/12/13  Ok Anis, NP  omeprazole (PRILOSEC) 40 MG capsule Take 40 mg by mouth daily.    Historical Provider, MD  potassium chloride (K-DUR,KLOR-CON) 10 MEQ tablet Take 1 tablet (10 mEq total) by mouth daily. 06/21/12   Roger A Arguello, PA-C  sertraline (ZOLOFT) 50 MG tablet Take 25 mg by mouth daily.    Historical Provider, MD  Ticagrelor (BRILINTA) 90 MG TABS  tablet Take 1 tablet (90 mg total) by mouth 2 (two) times daily. 06/21/12   Gery Pray, PA-C   Physical Exam: There were no vitals filed for this visit.   General:  Well nourished appears stated age  Eyes: PERRL EOMI no scleral icterus  ENT: ears clear mucus membrane mouth moist/pink  Neck: supple no JVD no lymphadenopathy  Cardiovascular: RRR no LE edema PPP  Respiratory: normal effort BS clear bilaterally to ausculatation  Abdomen: flat soft +BS no organomegaly non-tender to palpation  Skin: warm dry no rash no lesion  Musculoskeletal: MAE. No clubbing no cyanosis  Psychiatric: calm appropriate  Neurologic: speech clear facial symmetry. Cranial nerve II-XI intact  Labs on Admission:  Basic Metabolic Panel:  Recent Labs Lab 11/03/12 2306 11/05/12 1044  NA 134* 131*  K  4.3 4.5  CL 98 95*  CO2 23 24  GLUCOSE 218* 189*  BUN 15 13  CREATININE 0.89 0.85  CALCIUM 9.4 9.6   Liver Function Tests: No results found for this basename: AST, ALT, ALKPHOS, BILITOT, PROT, ALBUMIN,  in the last 168 hours No results found for this basename: LIPASE, AMYLASE,  in the last 168 hours No results found for this basename: AMMONIA,  in the last 168 hours CBC:  Recent Labs Lab 11/03/12 2306 11/05/12 1044  WBC 8.5 8.3  NEUTROABS 5.8  --   HGB 11.1* 11.5*  HCT 33.4* 34.1*  MCV 85.6 85.3  PLT 178 201   Cardiac Enzymes:  Recent Labs Lab 11/05/12 1044 11/05/12 1322 11/06/12 1723  TROPONINI <0.30 <0.30 <0.30    BNP (last 3 results)  Recent Labs  04/17/12 1521 07/29/12 1340 07/29/12 1855  PROBNP 438.7 863.6* 845.2*   CBG:  Recent Labs Lab 11/03/12 2030  GLUCAP 178*    Radiological Exams on Admission: No results found.  EKG: Independently reviewed. pending  Assessment/Plan Principal Problem:   Chest pain: given hx will admit to telemetry. Cardiology has arranged for cath in am. Will provide supportive care ie. Oxygen, NTG, morphine. Will cycle CE and  repeat ekg in am. NPO past midnight. Active Problems:   Hyperlipidemia: check lipid panel. Continue meds.      Diabetes mellitus, type II: HgA1c 8.1 3 weeks ago. Will use SSI for glycemic control, carb modified diet.     Hypertension: stable. Will continue home meds    Cerebrovascular disease: hx stroke. Some left UE weakness, otherwise no deficits. Continue home meds    Cardiomyopathy, ischemic: echo 3/14 yields EF 45-50% with grade 1 diastolic dysfunction. Monitor strict intake and output, daily weight. On admission does not appear volume overloaded. Continue home meds.   Dr. Tenny Craw cardiology  Code Status: full Family Communication:  Disposition Plan: may benefit from assisted living   Time spent: 60 minutes  [facility]  If 7PM-7AM, please contact night-coverage [email or web address] Password [facility] 11/08/2012, 3:58 PM Attending: Patient seen and examined. Agree with plan above.

## 2012-11-08 NOTE — Telephone Encounter (Signed)
Was advised by pt daughter that the pt was seen in the ED three times this week and was advised by the ED [facility] that he may need to be placed in assisted living, pt mentioned this factor to his daughter and the daughter called to see if Dr Tenny Craw would recommend the same, please advise

## 2012-11-09 ENCOUNTER — Other Ambulatory Visit: Payer: Self-pay

## 2012-11-09 ENCOUNTER — Encounter (HOSPITAL_COMMUNITY)
Admission: AD | Disposition: A | Discharge status: Home / Self Care | Payer: Self-pay | Source: Ambulatory Visit | Attending: Internal Medicine

## 2012-11-09 DIAGNOSIS — I2510 Atherosclerotic heart disease of native coronary artery without angina pectoris: Secondary | ICD-10-CM

## 2012-11-09 HISTORY — PX: LEFT HEART CATHETERIZATION WITH CORONARY/GRAFT ANGIOGRAM: SHX5450

## 2012-11-09 LAB — CBC
HCT: 34.3 % — ABNORMAL LOW (ref 39.0–52.0)
Hemoglobin: 11.4 g/dL — ABNORMAL LOW (ref 13.0–17.0)
Hemoglobin: 11.5 g/dL — ABNORMAL LOW (ref 13.0–17.0)
MCH: 28.2 pg (ref 26.0–34.0)
MCHC: 33.6 g/dL (ref 30.0–36.0)
MCV: 83.9 fL (ref 78.0–100.0)
MCV: 82.9 fL (ref 78.0–100.0)
RBC: 4.14 MIL/uL — ABNORMAL LOW (ref 4.22–5.81)
WBC: 8 10*3/uL (ref 4.0–10.5)

## 2012-11-09 LAB — BASIC METABOLIC PANEL
Calcium: 9.2 mg/dL (ref 8.4–10.5)
Creatinine, Ser: 0.78 mg/dL (ref 0.50–1.35)
GFR calc non Af Amer: 82 mL/min — ABNORMAL LOW (ref >90)
Glucose, Bld: 187 mg/dL — ABNORMAL HIGH (ref 70–99)
Sodium: 129 mEq/L — ABNORMAL LOW (ref 135–145)

## 2012-11-09 LAB — POCT ACTIVATED CLOTTING TIME: Activated Clotting Time: 111 seconds

## 2012-11-09 LAB — GLUCOSE, CAPILLARY
Glucose-Capillary: 159 mg/dL — ABNORMAL HIGH (ref 70–99)
Glucose-Capillary: 170 mg/dL — ABNORMAL HIGH (ref 70–99)
Glucose-Capillary: 209 mg/dL — ABNORMAL HIGH (ref 70–99)

## 2012-11-09 LAB — CREATININE, SERUM
GFR calc Af Amer: >90 mL/min (ref >90)
GFR calc non Af Amer: 80 mL/min — ABNORMAL LOW (ref >90)

## 2012-11-09 LAB — TROPONIN I: Troponin I: <0.3 ng/mL (ref <0.30)

## 2012-11-09 SURGERY — LEFT HEART CATHETERIZATION WITH CORONARY/GRAFT ANGIOGRAM
Anesthesia: LOCAL

## 2012-11-09 MED ORDER — HEPARIN SODIUM (PORCINE) 5000 UNIT/ML IJ SOLN
5000.0000 [IU] | Freq: Three times a day (TID) | INTRAMUSCULAR | Status: DC
  Administered 2012-11-09 – 2012-11-11 (×5): 5000 [IU] via SUBCUTANEOUS
  Filled 2012-11-09 (×8): qty 1

## 2012-11-09 MED ORDER — MIDAZOLAM HCL 2 MG/2ML IJ SOLN
INTRAMUSCULAR | Status: AC
Start: 2012-11-09 — End: 2012-11-09
  Filled 2012-11-09: qty 2

## 2012-11-09 MED ORDER — HEPARIN BOLUS VIA INFUSION
2000.0000 [IU] | Freq: Once | INTRAVENOUS | Status: AC
  Administered 2012-11-09: 2000 [IU] via INTRAVENOUS
  Filled 2012-11-09: qty 2000

## 2012-11-09 MED ORDER — LIDOCAINE HCL (PF) 1 % IJ SOLN
INTRAMUSCULAR | Status: AC
  Filled 2012-11-09: qty 30

## 2012-11-09 MED ORDER — FENTANYL CITRATE 0.05 MG/ML IJ SOLN
INTRAMUSCULAR | Status: AC
  Filled 2012-11-09: qty 2

## 2012-11-09 MED ORDER — HEPARIN (PORCINE) IN NACL 2-0.9 UNIT/ML-% IJ SOLN
INTRAMUSCULAR | Status: AC
  Filled 2012-11-09: qty 1000

## 2012-11-09 MED ORDER — SODIUM CHLORIDE 0.9 % IV SOLN: 1.0000 mL/kg/h | INTRAVENOUS | Status: AC

## 2012-11-09 NOTE — CV Procedure (Signed)
   Cardiac Catheterization Procedure Note  Name: Jeffrey [name] MRN: [id number] DOB: 13-Nov-1928  Procedure: Left Heart Cath, Selective Coronary Angiography, saphenous vein graft angiography, LIMA graft angiography, LV angiography  Indication: 79 year-old white male with extensive cardiac history. One month ago he had stenting of the saphenous vein graft to the ramus branch. He has known occlusion of the vein graft to the first and second OM's. He presents now with recurrent chest pain at rest.   Procedural details: The right groin was prepped, draped, and anesthetized with 1% lidocaine. Using modified Seldinger technique, a 5 French sheath was introduced into the right femoral artery. Standard Judkins catheters were used for coronary angiography and left ventriculography. Catheter exchanges were performed over a guidewire. There were no immediate procedural complications. The patient was transferred to the post catheterization recovery area for further monitoring.  Procedural Findings: Hemodynamics:  AO 117/58 with a mean of 83 mmHg LV 117/9 mmHg   Coronary angiography: Coronary dominance: right  Left mainstem: The left main coronary is heavily calcified. It has mild tapering distally up to 30%.  Left anterior descending (LAD): The left anterior descending artery is heavily calcified in the proximal vessel. There is a 90% proximal stenosis followed by complete occlusion following a large septal perforator branch.  Left circumflex (LCx): The first and second obtuse marginal vessels are occluded. The left circumflex is diffusely diseased throughout the mid vessel up to 70%. The distal vessel is occluded.  Right coronary artery (RCA): The right coronary is small and diffusely diseased up to 90% throughout the proximal vessel. It is occluded in the mid vessel. There are bridging right to right collaterals as well as left to right collaterals.  The saphenous vein graft to the obtuse marginal  vessels is occluded.  The saphenous vein graft to the ramus intermediate branch is widely patent. The stented segment in the proximal vessel is without significant disease.  The LIMA graft to the LAD is widely patent with good runoff. There is a 50% stenosis in the LAD following the graft touchdown.  Left ventriculography: Left ventricular systolic function is normal, LVEF is estimated at 55-60%, there is mild inferior basal hypokinesis,there is no significant mitral regurgitation   Final Conclusions:   1. Severe three-vessel obstructive coronary disease. 2. The saphenous vein graft to the ramus intermediate branch is patent. 3. Patent LIMA graft to the LAD. 4. Occluded saphenous vein graft to the obtuse marginal vessels. 5. Good left ventricular function.  Recommendations: There are no new lesions or recurrent stenosis to explain this patient's chest pain symptoms. I would continue medical management.  Theron Arista [facility] 11/09/2012, 10:52 AM

## 2012-11-09 NOTE — H&P (View-Only) (Signed)
Patient ID: Jeffrey [name] MRN: [id number], DOB/AGE: Aug 16, 1928   Admit date: 11/08/2012 Date of Consult: @TODAY @  Primary Physician: [name], MD Primary Cardiologist:[place]   Problem List: Past Medical History  Diagnosis Date  . Diabetes mellitus, type II   . Hyperlipidemia     Lipid profile in 02/2012:135, 227, 41, 49  . Arteriosclerotic cardiovascular disease (ASCVD)     a. CABG x 4 in 1989 (VG->OM1->OM2, VG->RCA, LIMA->LAD), b. 05/2010: DES to VG-OM1/OM2, DES to distal LCx. c. NSTEMI in 04/2011 - TO distal LCX stent and VG->OM2. d. 02/2012 NSTEMI DES to VG-OM1/continuation to OM2 occluded. e. inferior STEMI s/p DES to SVG-RAMUS 06/2012. f. inferolat STEMI 09/2012 s/p DES to SVG-interm.  . Hypertension   . Osteoarthritis   . CVA (cerebral infarction)     details unclear. pt reports light stroke last year involving L leg. No imaging to suggest hemorrhagic etiology  . Pneumonia   . Cervical vertebral fracture   . Chronic back pain   . Benign prostatic hypertrophy     Required urinary catheter x4 weeks for retention  . Peptic ulcer disease   . Gastroesophageal reflux disease   . Ischemic cardiomyopathy     EF 45-50% 3/32014  . DM (diabetes mellitus)     Past Surgical History  Procedure Laterality Date  . Coronary angioplasty with stent placement      2011, 02/2012  . Tonsillectomy    . Coronary artery bypass graft       Allergies:  Allergies  Allergen Reactions  . Heparin     HPI: Patient is an 79 yo with a history of CAD.  He presented to cardiology clinic today for f/u care 79 y.o. male with atherosclerotic coronary artery disease, diabetes, and hypertension who was recently admitted and treated for a STEMI at [facility] on 3/36/2014. Cardiac catheterization revealed significant in-stent stenosis in SVG to intermedius, severe triple vessel disease and status post four-vessel CABG with 2 of the 4 bypass grafts patent. He underwent PTCA/Stent to SVG.  He represented  to [facility] with CP Was admitted. R/O for MI CP felt to be noncardiac and he was sent home. Since then he presented to [facility] ER on Friday with CP  I heard about this visit.  EKG was not acute.  I recomm stopping ramipril to allow BP to increase since he had signif collaterals.  The patient presented then to [facility] ER on Saturday.  Told to continue meds, restart imdur (not clear when discontinued).  Today the patient says he is continuing to get chest pressure with activity.  No pain while resting.  He gets very SOB with activity.  He says that with the recent intervention he never felt better.  Lives alone  Says he cannot go on like this.    Inpatient Medications:  . aspirin  324 mg Oral NOW   Or  . aspirin  300 mg Rectal NOW  . [START ON 11/09/2012] aspirin  324 mg Oral Pre-Cath  . [START ON 11/09/2012] aspirin EC  81 mg Oral Daily  . aspirin  325 mg Oral Daily  . atorvastatin  80 mg Oral q1800  . calcium carbonate  1 tablet Oral TID PC & HS  . carvedilol  3.125 mg Oral BID WC  . fish oil-omega-3 fatty acids  1 g Oral Daily  . isosorbide mononitrate  30 mg Oral Daily  . linagliptin  5 mg Oral Daily  . multivitamin with minerals  1 tablet Oral Daily  .  nitrofurantoin  100 mg Oral QHS  . pantoprazole  40 mg Oral Daily  . potassium chloride  10 mEq Oral Daily  . sertraline  25 mg Oral Daily  . sodium chloride  3 mL Intravenous Q12H  . sodium chloride  3 mL Intravenous Q12H  . Ticagrelor  90 mg Oral BID    Family History  Problem Relation Age of Onset  . Early death      Parents died young  . Appendicitis Mother     Pt was 29 year old  . Heart attack Father 33     History   Social History  . Marital Status: Divorced    Spouse Name: N/A    Number of Children: N/A  . Years of Education: N/A   Occupational History  . Retired     Probation officer   Social History Main Topics  . Smoking status: Former Smoker -- 2.00 packs/day for 10 years    Types: Cigarettes    Quit date: 07/14/1961   . Smokeless tobacco: Current User    Types: Chew  . Alcohol Use: No  . Drug Use: No  . Sexually Active: No   Other Topics Concern  . Not on file   Social History Narrative   ** Merged History Encounter **...   He was raised by his uncle.  His mother deceased when   he was 47 year old with appendicitis. Father deceased from an MI at age   71.  He has 1 sister, but he does not know her health status as they   have been separated.            Review of Systems:  All other systems reviewed and are otherwise negative except as noted above.  Physical Exam: Filed Vitals:   11/08/12 1605  BP: 116/64  Pulse: 82  Temp: 98 F (36.7 C)  Resp: 20   No intake or output data in the 24 hours ending 11/08/12 1619  General:  Pale 79 yo in NAD Head: Normocephalic, atraumatic, sclera non-icteric Neck: Negative for carotid bruits. JVP not elevated. Lungs: Clear bilaterally to auscultation without wheezes, rales, or rhonchi. Breathing is unlabored. Heart: RRR with S1 S2. No murmurs, rubs, or gallops appreciated. Abdomen: Soft, non-tender, non-distended with normoactive bowel sounds. No hepatomegaly. No rebound/guarding. No obvious abdominal masses. Msk:  Strength and tone appears normal for age. Extremities: No clubbing, cyanosis or edema.  Distal pedal pulses are 2+ and equal bilaterally. Neuro: Alert and oriented X 3. Moves all extremities spontaneously. Psych:  Responds to questions appropriately with a normal affect.  Labs: No results found for this or any previous visit (from the past 24 hour(s)).  Radiology/Studies: Dg Chest 2 View  11/05/2012  *RADIOLOGY REPORT*  Clinical Data: Chest pain  CHEST - 2 VIEW  Comparison: November 04, 2012.  Findings: Sternotomy wires are noted.  Cardiomediastinal silhouette appears normal.  No acute pulmonary disease is noted.  Bony thorax is intact.  No pleural effusion or pneumothorax is noted.  IMPRESSION: No acute cardiopulmonary abnormality seen.    Original Report Authenticated By: Lupita Raider.,  M.D.     EKG:  SR 90 bpm.  IWMI.  T wave inversion II, III, AVF.  V4 to V6.  Old.  ASSESSMENT AND PLAN:   Patient is an 79 yo with severe CAD>  Now with continued angina.   Exertional.   I have reviewed his history with C McAlhany who performed the last intervention.  Although instent  problems are unlikely with his continued pain and multiple presentations to ER he feels a repeat cath to redefine anatomy is indicated.   Will recomm admission to hosp tonight.  Start heparin.  IV fluids in AM  Plan for tx to [facility] in AM for [facility].  Patient is amenable  Wants to find out what is going on Continue other meds for now.   Watch BP closely  2.  HL  Continue lipitor  3.  DM  Hold  Metformin.  Follow glucose.       Signed, Dietrich Pates 11/08/2012, 4:19 PM

## 2012-11-09 NOTE — Progress Notes (Signed)
UR Chart Review Completed  

## 2012-11-09 NOTE — Progress Notes (Signed)
     Subjective: This man overnight has been medically stable. Serial cardiac enzymes are negative.  At the time of writing of this note, he has already been transferred to [facility] for cardiac catheterization. The patient was seen by myself earlier.           Physical Exam: Blood pressure 112/61, pulse 78, temperature 98.3 F (36.8 C), temperature source Oral, resp. rate 17, height 5\' 10"  (1.778 m), weight 77.1 kg (169 lb 15.6 oz), SpO2 96.00%. He looks systemically well. No new physical findings.   Investigations:     Basic Metabolic Panel:  Recent Labs  16/10/96 1638 11/09/12 0345  NA 133* 129*  K 4.0 3.8  CL 98 95*  CO2 22 24  GLUCOSE 150* 187*  BUN 15 13  CREATININE 0.93 0.78  CALCIUM 9.7 9.2   Liver Function Tests:  Recent Labs  11/08/12 1638  AST 22  ALT 23  ALKPHOS 83  BILITOT 0.2*  PROT 6.9  ALBUMIN 3.8     CBC:  Recent Labs  11/08/12 1638 11/09/12 0345  WBC 7.6 7.1  NEUTROABS 4.9  --   HGB 11.4* 11.4*  HCT 33.6* 33.9*  MCV 84.0 83.9  PLT 188 176        Medications: I have reviewed the patient's current medications.  Impression: 1. Unstable angina. 2. History of coronary artery disease. 3. Hypertension. 4. Type 2 diabetes mellitus.     Plan: Transfer to [facility] for cardiac catheterization as already planned.     LOS: 1 day   Wilson Singer Pager [phone]  11/09/2012, 10:02 AM

## 2012-11-09 NOTE — Progress Notes (Signed)
Pt. [facility]. Pt. Denies chest pain at this time. [facility] transporting with [facility] package, IVs intact, beta blocker given, insulin held (blood sugar 159 this morning), surgical consent obtained and sent with [facility] package.

## 2012-11-09 NOTE — Progress Notes (Signed)
ANTICOAGULATION CONSULT NOTE  Pharmacy Consult for Heparin Indication: NSTEMI  No Known Allergies  Patient Measurements: Height: 5\' 10"  (177.8 cm) Weight: 169 lb 15.6 oz (77.1 kg) IBW/kg (Calculated) : 73  Vital Signs: Temp: 98.3 F (36.8 C) (04/29 0414) Temp src: Oral (04/29 0414) BP: 112/61 mmHg (04/29 0414) Pulse Rate: 78 (04/29 0717)  Labs:  Recent Labs  11/08/12 1638 11/08/12 2201 11/09/12 0153 11/09/12 0344 11/09/12 0345  HGB 11.4*  --   --   --  11.4*  HCT 33.6*  --   --   --  33.9*  PLT 188  --   --   --  176  APTT 26  --   --   --   --   LABPROT 12.9  --   --   --   --   INR 0.98  --   --   --   --   HEPARINUNFRC  --   --  0.16*  --   --   CREATININE 0.93  --   --   --  0.78  TROPONINI <0.30 <0.30  --  <0.30  --     Estimated Creatinine Clearance: 74.8 ml/min (by C-G formula based on Cr of 0.78).   Medical History: Past Medical History  Diagnosis Date  . Diabetes mellitus, type II   . Hyperlipidemia     Lipid profile in 02/2012:135, 227, 41, 49  . Arteriosclerotic cardiovascular disease (ASCVD)     a. CABG x 4 in 1989 (VG->OM1->OM2, VG->RCA, LIMA->LAD), b. 05/2010: DES to VG-OM1/OM2, DES to distal LCx. c. NSTEMI in 04/2011 - TO distal LCX stent and VG->OM2. d. 02/2012 NSTEMI DES to VG-OM1/continuation to OM2 occluded. e. inferior STEMI s/p DES to SVG-RAMUS 06/2012. f. inferolat STEMI 09/2012 s/p DES to SVG-interm.  . Hypertension   . Osteoarthritis   . CVA (cerebral infarction)     details unclear. pt reports light stroke last year involving L leg. No imaging to suggest hemorrhagic etiology  . Pneumonia   . Cervical vertebral fracture   . Chronic back pain   . Benign prostatic hypertrophy     Required urinary catheter x4 weeks for retention  . Peptic ulcer disease   . Gastroesophageal reflux disease   . Ischemic cardiomyopathy     EF 45-50% 3/32014  . DM (diabetes mellitus)     Medications:  Scheduled:  . [COMPLETED] aspirin  324 mg Oral NOW    Or  . [COMPLETED] aspirin  300 mg Rectal NOW  . [COMPLETED] aspirin  324 mg Oral Pre-Cath  . [START ON 11/10/2012] aspirin EC  81 mg Oral Daily  . atorvastatin  80 mg Oral q1800  . calcium carbonate  1 tablet Oral TID PC & HS  . carvedilol  3.125 mg Oral BID WC  . docusate sodium  100 mg Oral QHS  . [COMPLETED] heparin  2,000 Units Intravenous Once  . [COMPLETED] heparin  4,000 Units Intravenous Once  . insulin aspart  0-20 Units Subcutaneous TID WC  . insulin aspart  0-5 Units Subcutaneous QHS  . isosorbide mononitrate  30 mg Oral Daily  . linagliptin  5 mg Oral Daily  . multivitamin with minerals  1 tablet Oral Daily  . nitrofurantoin  100 mg Oral QHS  . omega-3 acid ethyl esters  1 g Oral Daily  . pantoprazole  40 mg Oral Daily  . potassium chloride  10 mEq Oral Daily  . sertraline  25 mg Oral Daily  .  sodium chloride  3 mL Intravenous Q12H  . sodium chloride  3 mL Intravenous Q12H  . Ticagrelor  90 mg Oral BID  . [DISCONTINUED] aspirin EC  81 mg Oral Daily  . [DISCONTINUED] aspirin  325 mg Oral Daily  . [DISCONTINUED] Docusate Sodium  100 mg Oral QHS  . [DISCONTINUED] fish oil-omega-3 fatty acids  1 g Oral Daily  . [DISCONTINUED] metFORMIN  500 mg Oral BID    Assessment: 79 yo M with history of severe CAD admitted with ongoing chest pain.  Plans for cardiac cath today at [facility].  No history of bleeding noted.  Initial heparin level sub-therapeutic.   Goal of Therapy:  Heparin level 0.3-0.7 units/ml Monitor platelets by anticoagulation protocol: Yes   Plan:  1) Repeat heparin 2000 unit IV bolus x1 then increase infusion to 1200 units/hr 2) Recheck 8 hr heparin level if heparin still infusing 3) Daily heparin level & CBC while on heparin  Elson Clan 11/09/2012,7:38 AM

## 2012-11-09 NOTE — Interval H&P Note (Signed)
History and Physical Interval Note:  11/09/2012 10:24 AM  Jeffrey [name]  has presented today for surgery, with the diagnosis of Chest pain  The various methods of treatment have been discussed with the patient and family. After consideration of risks, benefits and other options for treatment, the patient has consented to  Procedure(s): LEFT HEART CATHETERIZATION WITH CORONARY/GRAFT ANGIOGRAM (N/A) as a surgical intervention .  The patient's history has been reviewed, patient examined, no change in status, stable for surgery.  I have reviewed the patient's chart and labs.  Questions were answered to the patient's satisfaction.     Theron Arista [facility] 11/09/2012 10:24 AM

## 2012-11-10 LAB — GLUCOSE, CAPILLARY: Glucose-Capillary: 181 mg/dL — ABNORMAL HIGH (ref 70–99)

## 2012-11-10 NOTE — Care Management Note (Unsigned)
    Page 1 of 1   11/10/2012     4:50:25 PM   CARE MANAGEMENT NOTE 11/10/2012  Patient:  DUTCH, ING   Account Number:  [id number]  Date Initiated:  11/10/2012  Documentation initiated by:  GRAVES-BIGELOW,Gregery Walberg  Subjective/Objective Assessment:   Pt in with cp. Pt lives alone and has minimal family support.     Action/Plan:   CM did discuss with pt about HH services and he chose [facility] and PT services. CM did make referral for services and SOC to begin within 24-48 hours post d/c. No dme needed at this time. Will need HH orders for services. RN is aware.   Anticipated DC Date:  11/11/2012   Anticipated DC Plan:  HOME W HOME HEALTH SERVICES      DC Planning Services  CM consult      [facility] Choice  HOME HEALTH   Choice offered to / List presented to:  C-1 Patient        HH arranged  HH-1 RN  HH-10 DISEASE MANAGEMENT  HH-2 PT      HH agency  [facility]   Status of service:  Completed, signed off Medicare Important Message given?   (If response is "NO", the following Medicare IM given date fields will be blank) Date Medicare IM given:   Date Additional Medicare IM given:    Discharge Disposition:  HOME W HOME HEALTH SERVICES  Per UR Regulation:  Reviewed for med. necessity/level of care/duration of stay  If discussed at Long Length of Stay Meetings, dates discussed:    Comments:

## 2012-11-10 NOTE — Progress Notes (Signed)
Occupational Therapy Evaluation Patient Details Name: Jeffrey [name] MRN: [id number] DOB: [birth year] Today's Date: 11/10/2012 Time: 4098-1191 OT Time Calculation (min): 14 min  OT Assessment / Plan / Recommendation Clinical Impression  79 yo with heart hx. Admitted for c/o c/p. Underwent heart cath yesterday. Pt to d/C home alone and has limited family support. Will benefit from skilled OT services to facilitate D/C home with [facility] services.     OT Assessment  Patient needs continued OT Services    Follow Up Recommendations  Home health OT    Barriers to Discharge Decreased caregiver support    Equipment Recommendations  3 in 1 bedside comode    Recommendations for Other Services    Frequency  Min 2X/week    Precautions / Restrictions Precautions Precautions: Fall Precaution Comments: has not fallen at home Restrictions Weight Bearing Restrictions: No   Pertinent Vitals/Pain no apparent distress O2 sats >96 RA. HR 80s    ADL  Grooming: Modified independent Where Assessed - Grooming: Unsupported standing Upper Body Bathing: Supervision/safety;Set up Where Assessed - Upper Body Bathing: Unsupported standing Lower Body Bathing: Set up;Supervision/safety Where Assessed - Lower Body Bathing: Unsupported sit to stand Upper Body Dressing: Set up;Supervision/safety Where Assessed - Upper Body Dressing: Unsupported sitting Lower Body Dressing: Supervision/safety;Set up Where Assessed - Lower Body Dressing: Unsupported sit to stand Toilet Transfer: Supervision/safety Toilet Transfer Method: Sit to Barista: Comfort height toilet Toileting - Clothing Manipulation and Hygiene: Modified independent Where Assessed - Engineer, mining and Hygiene: Sit to stand from 3-in-1 or toilet Equipment Used: Gait belt;Rolling walker Transfers/Ambulation Related to ADLs: S ADL Comments: Fatigues easily    OT Diagnosis: Generalized weakness  OT Problem  List: Decreased strength;Decreased activity tolerance;Decreased knowledge of use of DME or AE;Cardiopulmonary status limiting activity OT Treatment Interventions: Self-care/ADL training;Therapeutic exercise;Energy conservation;Therapeutic activities;Patient/family education   OT Goals Acute Rehab OT Goals OT Goal Formulation: With patient/family Time For Goal Achievement: 12/10/12 Potential to Achieve Goals: Good ADL Goals Additional ADL Goal #1: Pt will verbalize 3 E conservatino techniwues for ADl ADL Goal: Additional Goal #1 - Progress: Goal set today Additional ADL Goal #2: Pt will verbalize improtnce of sitting while in shwoer and need to pace  self during activities. ADL Goal: Additional Goal #2 - Progress: Goal set today  Visit Information  Last OT Received On: 11/10/12 Assistance Needed: +1    Subjective Data      Prior Functioning     Home Living Lives With: Alone Type of Home: Apartment Home Access: Level entry Home Layout: One level Bathroom Shower/Tub: Engineer, manufacturing systems: Handicapped height Bathroom Accessibility: Yes How Accessible: Accessible via walker Home Adaptive Equipment: Grab bars in shower;Grab bars around toilet;Walker - rolling;Straight cane Prior Function Level of Independence: Independent Able to Take Stairs?: Yes Driving: Yes Vocation: Retired Musician: No difficulties         Vision/Perception Vision - History Baseline Vision: No visual deficits   Cognition  Cognition Arousal/Alertness: Awake/alert Behavior During Therapy: WFL for tasks assessed/performed Overall Cognitive Status: Within Functional Limits for tasks assessed (? memory deficits)    Extremity/Trunk Assessment Right Upper Extremity Assessment RUE ROM/Strength/Tone: WFL for tasks assessed Left Upper Extremity Assessment LUE ROM/Strength/Tone: WFL for tasks assessed Right Lower Extremity Assessment RLE ROM/Strength/Tone: WFL for tasks  assessed Left Lower Extremity Assessment LLE ROM/Strength/Tone: WFL for tasks assessed Trunk Assessment Trunk Assessment: Normal     Mobility Bed Mobility Bed Mobility: Supine to Sit Supine to Sit: 7: Independent  Sit to Supine: 7: Independent Transfers Transfers: Stand to Sit;Sit to Stand     Exercise     Balance Balance Balance Assessed:  (WFL for ADL)   End of Session OT - End of Session Equipment Utilized During Treatment: Gait belt Activity Tolerance: Patient tolerated treatment well Patient left: in chair;with call bell/phone within reach Nurse Communication: Mobility status  GO Functional Assessment Tool Used: clinical judgement Functional Limitation: Self care Self Care Current Status (U9811): At least 1 percent but less than 20 percent impaired, limited or restricted Self Care Goal Status (B1478): At least 1 percent but less than 20 percent impaired, limited or restricted   Arihana Ambrocio,HILLARY 11/10/2012, 5:09 PM [facility], OTR/L  [phone] 11/10/2012

## 2012-11-10 NOTE — Progress Notes (Signed)
TELEMETRY: Reviewed telemetry pt in NSR: Filed Vitals:   11/10/12 0000 11/10/12 0256 11/10/12 0400 11/10/12 0757  BP: 107/53 118/63 112/60 113/64  Pulse: 77  76 78  Temp: 98.2 F (36.8 C)  97.9 F (36.6 C) 98.3 F (36.8 C)  TempSrc:    Oral  Resp: 18  18 18   Height:      Weight:      SpO2: 97%  94% 97%    Intake/Output Summary (Last 24 hours) at 11/10/12 0853 Last data filed at 11/09/12 1700  Gross per 24 hour  Intake    760 ml  Output    300 ml  Net    460 ml    SUBJECTIVE Patient feels very weak. Unsteady on feet. Prior to hospital patient was struggling to care for himself at home. He lives alone. Denies any chest pain.  LABS: Basic Metabolic Panel:  Recent Labs  56/21/30 1638 11/09/12 0345 11/09/12 1535  NA 133* 129*  --   K 4.0 3.8  --   CL 98 95*  --   CO2 22 24  --   GLUCOSE 150* 187*  --   BUN 15 13  --   CREATININE 0.93 0.78 0.85  CALCIUM 9.7 9.2  --    Liver Function Tests:  Recent Labs  11/08/12 1638  AST 22  ALT 23  ALKPHOS 83  BILITOT 0.2*  PROT 6.9  ALBUMIN 3.8   No results found for this basename: LIPASE, AMYLASE,  in the last 72 hours CBC:  Recent Labs  11/08/12 1638 11/09/12 0345 11/09/12 1535  WBC 7.6 7.1 8.0  NEUTROABS 4.9  --   --   HGB 11.4* 11.4* 11.5*  HCT 33.6* 33.9* 34.3*  MCV 84.0 83.9 82.9  PLT 188 176 181   Cardiac Enzymes:  Recent Labs  11/08/12 1638 11/08/12 2201 11/09/12 0344  TROPONINI <0.30 <0.30 <0.30     Radiology/Studies:  Dg Chest 2 View  11/05/2012  *RADIOLOGY REPORT*  Clinical Data: Chest pain  CHEST - 2 VIEW  Comparison: November 04, 2012.  Findings: Sternotomy wires are noted.  Cardiomediastinal silhouette appears normal.  No acute pulmonary disease is noted.  Bony thorax is intact.  No pleural effusion or pneumothorax is noted.  IMPRESSION: No acute cardiopulmonary abnormality seen.   Original Report Authenticated By: Lupita Raider.,  M.D.    Dg Chest 2 View  11/04/2012  *RADIOLOGY  REPORT*  Clinical Data: Weakness and hypoglycemia.  Ex-smoker.  Diabetes.  CHEST - 2 VIEW  Comparison: 10/21/2012  Findings: Mild hyperinflation. Lateral view degraded by patient arm position.  Prior median sternotomy. Midline trachea.  Normal heart size without the atherosclerosis in the transverse aorta.  Tortuous descending thoracic aorta. No pleural effusion or pneumothorax. Clear lungs.  No congestive failure.  IMPRESSION: Hyperinflation, without acute disease.   Original Report Authenticated By: Jeronimo Greaves, M.D.    Dg Chest Port 1 View  10/21/2012  *RADIOLOGY REPORT*  Clinical Data: Chest pain  PORTABLE CHEST - 1 VIEW  Comparison: 10/16/2012  Findings: Normal heart size.  Clear lungs.  No pneumothorax.  No acute bony deformity.  No pleural effusion.  Postoperative changes.  IMPRESSION: No active cardiopulmonary disease.   Original Report Authenticated By: Jolaine Click, M.D.    Dg Chest Port 1 View  10/16/2012  *RADIOLOGY REPORT*  Clinical Data: Chest pain.  PORTABLE CHEST - 1 VIEW  Comparison: 10/06/2012  Findings: Stable postoperative changes in the mediastinum.  Normal heart size  and pulmonary vascularity.  Mild hyperinflation.  No focal consolidation in the lungs.  No blunting of costophrenic angles.  No pneumothorax.  Mediastinal contours appear intact.  No significant change since previous study.  IMPRESSION: No evidence of active pulmonary disease.   Original Report Authenticated By: Burman Nieves, M.D.    Ecg: NSR with mild T wave inversion inferiorly.  PHYSICAL EXAM General: Well developed, elderly, in no acute distress. Head: Normal. edentulous Neck: Negative for carotid bruits. JVD not elevated. Lungs: Clear bilaterally to auscultation without wheezes, rales, or rhonchi. Breathing is unlabored. Heart: RRR S1 S2 without murmurs, rubs, or gallops.  Abdomen: Soft, non-tender, non-distended with normoactive bowel sounds.  Extremities: No clubbing, cyanosis or edema.  Distal pedal pulses  are 2+ and equal bilaterally. Neuro: Alert and oriented X 3. Moves all extremities spontaneously. Psych:  Anxious about returning home.  ASSESSMENT AND PLAN: 1. Chest pain. S/p CABG. S/p stent of SVG to ramus. Cath yesterday shows no new disease. Stent in SVG to ramus still patent. Continue medical management. 2. DM type 2 3. Hyperlipidemia. 4. HTN controlled. 5. History of CVA 6. Chronic back pain. 7. Weakness- No clear etiology. Hgb stable. TSH is normal. General decline over past several months. Patient lives independently but I'm not sure he can still function alone. His primary care has suggested assisted living before but he has been reluctant to consider. Will get OT/ PT evaluation assessment today.   Principal Problem:   Chest pain Active Problems:   Hyperlipidemia   Arteriosclerotic cardiovascular disease (ASCVD)   Diabetes mellitus, type II   Hypertension   Cerebrovascular disease   Cardiomyopathy, ischemic    Signed, Manmeet Arzola [place] MD,FACC 11/10/2012 9:04 AM

## 2012-11-10 NOTE — Progress Notes (Signed)
Physical Therapy Evaluation Patient Details Name: Jeffrey [name] MRN: [id number] DOB: 1929-02-18 Today's Date: 11/10/2012 Time: 1208-1250 PT Time Calculation (min): 42 min  PT Assessment / Plan / Recommendation Clinical Impression  Pt is 79 yo male s/p CP who has been struggling with fatigue at home and decreased ability to care for self due to this. He presents with functional weakness and poor tolerance for activity, however, he reports that he is already feeling better than before admit, since proccedure yesterday. Recommend HHPT to follow, acute PT will attempt to see once more before d/c tomorrow.    PT Assessment  Patient needs continued PT services    Follow Up Recommendations  Home health PT    Does the patient have the potential to tolerate intense rehabilitation      Barriers to Discharge Decreased caregiver support daughter does not drive, granddaughter takes care of her other grandfather    Equipment Recommendations  None recommended by PT    Recommendations for Other Services     Frequency Min 3X/week    Precautions / Restrictions Precautions Precautions: Fall Precaution Comments: has not fallen at home Restrictions Weight Bearing Restrictions: No   Pertinent Vitals/Pain No c/o pain      Mobility  Bed Mobility Bed Mobility: Supine to Sit;Sit to Supine Supine to Sit: 7: Independent Sit to Supine: 7: Independent Transfers Transfers: Sit to Stand;Stand to Sit Sit to Stand: 7: Independent Stand to Sit: 7: Independent Ambulation/Gait Ambulation/Gait Assistance: 5: Supervision Ambulation Distance (Feet): 125 Feet Assistive device: Rolling walker;None Ambulation/Gait Assistance Details: pt ambulated 10' with no AD and was already fatigued and ready to sit back down. Pt given RW at that point and then able to ambulate 115' without a rest. Educated him on the energy conservation benefits of using RW. Pt agreeable but admittedly resistant to using assistive  devices because he says he has never needed them before Gait Pattern: Within Functional Limits Gait velocity: WFL General Gait Details: vc's for use of RW and staying close to it as well as keeping it on the ground with turning Stairs: No Wheelchair Mobility Wheelchair Mobility: No    Exercises     PT Diagnosis: Generalized weakness  PT Problem List: Decreased strength;Decreased activity tolerance;Decreased mobility;Decreased knowledge of use of DME;Decreased knowledge of precautions;Cardiopulmonary status limiting activity PT Treatment Interventions: DME instruction;Gait training;Functional mobility training;Therapeutic activities;Therapeutic exercise;Patient/family education   PT Goals Acute Rehab PT Goals PT Goal Formulation: With patient Time For Goal Achievement: 11/17/12 Potential to Achieve Goals: Good Pt will Ambulate: >150 feet;with modified independence;with least restrictive assistive device PT Goal: Ambulate - Progress: Goal set today  Visit Information  Last PT Received On: 11/10/12 Assistance Needed: +1    Subjective Data  Subjective: I'm weak Patient Stated Goal: return home   Prior Functioning  Home Living Lives With: Alone Type of Home: Apartment Home Access: Level entry Home Layout: One level Bathroom Shower/Tub: Engineer, manufacturing systems: Handicapped height Home Adaptive Equipment: Grab bars in shower;Grab bars around toilet;Walker - rolling;Straight cane Prior Function Level of Independence: Independent Able to Take Stairs?: Yes Driving: Yes Vocation: Retired Comments: pt has never really needed help before, occasionally has had Advanced after stent placement Communication Communication: No difficulties    Cognition  Cognition Arousal/Alertness: Awake/alert Behavior During Therapy: WFL for tasks assessed/performed Overall Cognitive Status: Within Functional Limits for tasks assessed    Extremity/Trunk Assessment Right Upper Extremity  Assessment RUE ROM/Strength/Tone: [facility] for tasks assessed Left Upper Extremity Assessment LUE ROM/Strength/Tone: [facility]  for tasks assessed Right Lower Extremity Assessment RLE ROM/Strength/Tone: Deficits RLE ROM/Strength/Tone Deficits: pt tests 4+/5 strength throughout but this does not correlate with his functional strength, he is fatiguing very quickly and cannot sustain prolonged contractions or perform high reps due to fatigue RLE Sensation: WFL - Light Touch;WFL - Proprioception RLE Coordination: WFL - gross motor Left Lower Extremity Assessment LLE ROM/Strength/Tone: Deficits LLE ROM/Strength/Tone Deficits: grossly 4/5 throughout, weaker than right side since CVA, also fatigues quickly with prolonged contraction or high reps LLE Sensation: WFL - Light Touch;WFL - Proprioception LLE Coordination: WFL - gross motor Trunk Assessment Trunk Assessment: Normal   Balance Balance Balance Assessed: Yes Dynamic Standing Balance Dynamic Standing - Balance Support: No upper extremity supported;During functional activity Dynamic Standing - Level of Assistance: 5: Stand by assistance Dynamic Standing - Comments: slightly unsteady due to fatigue, no true balance deficits noted  End of Session PT - End of Session Equipment Utilized During Treatment: Gait belt Activity Tolerance: Patient tolerated treatment well;Patient limited by fatigue Patient left: in bed;with call bell/phone within reach Nurse Communication: Mobility status  GP Functional Assessment Tool Used: clinical judgement Functional Limitation: Mobility: Walking and moving around Mobility: Walking and Moving Around Current Status (N6295): At least 1 percent but less than 20 percent impaired, limited or restricted Mobility: Walking and Moving Around Goal Status [phone]): 0 percent impaired, limited or restricted  Lyanne Co, PT  Acute Rehab Services  [phone]  Lyanne Co 11/10/2012, 1:42 PM

## 2012-11-11 ENCOUNTER — Encounter (HOSPITAL_COMMUNITY): Payer: Self-pay | Admitting: Physician Assistant

## 2012-11-11 DIAGNOSIS — D649 Anemia, unspecified: Secondary | ICD-10-CM | POA: Insufficient documentation

## 2012-11-11 LAB — GLUCOSE, CAPILLARY: Glucose-Capillary: 241 mg/dL — ABNORMAL HIGH (ref 70–99)

## 2012-11-11 MED ORDER — METFORMIN HCL 500 MG PO TABS: 500.0000 mg | ORAL_TABLET | Freq: Two times a day (BID) | ORAL | Status: DC

## 2012-11-11 MED ORDER — NITROGLYCERIN 0.4 MG SL SUBL: 0.4000 mg | SUBLINGUAL_TABLET | SUBLINGUAL | Status: DC | PRN

## 2012-11-11 NOTE — Discharge Summary (Signed)
Patient seen and examined and history reviewed. Agree with above findings and plan. See progress note.  Bhavika Schnider JordanMD 11/11/2012 12:42 PM    

## 2012-11-11 NOTE — Discharge Summary (Signed)
CARDIOLOGY DISCHARGE SUMMARY   Patient ID: Jeffrey [name] MRN: [id number] DOB/AGE: [birth year] 79 y.o.  Admit date: 11/08/2012 Discharge date: 11/11/2012  Primary Discharge Diagnosis:    Angina, class III  Secondary Discharge Diagnosis:    Hyperlipidemia   Arteriosclerotic cardiovascular disease (ASCVD)   Diabetes mellitus, type II   Hypertension   Cerebrovascular disease   Cardiomyopathy, ischemic   Anemia   Procedures: Left Heart Cath, Selective Coronary Angiography, saphenous vein graft angiography, LIMA graft angiography, LV angiography    Hospital Course: Jeffrey [name] is a 79 y.o. male with a history of CAD. He had multiple episodes of chest pain and 4 ER visits. He was seen in the office on 11/08/2012 and was having chest pain with minimal exertion. He was admitted to [facility] for further evaluation and treatment.  His cardiac enzymes were negative for MI. With his history of coronary artery disease and multiple episodes of chest pain, he was transferred to [facility] for further evaluation cardiac catheterization.  Full cardiac catheterization results are below. The RCA is blocked but this is old and has collateral circulation. The recently placed stent is patent and no new lesions were seen. Although he has severe three-vessel disease, his bypass grafts to the LAD and OM are patent and medical therapy was recommended.  On Nov 11, 2012, his labs were reviewed. His sodium was slightly low but this is felt secondary to hydration for the cath. He is mildly anemic but this is stable. His metformin was held because of the heart catheterization but his sugars were managed with sliding scale insulin and he is to restart the metformin in 48 hours. Above 1 2014, he was evaluated by Dr. Tenny Craw and considered stable for discharge, to follow up as an outpatient.  Labs:  Lab Results  Component Value Date   WBC 8.0 11/09/2012   HGB 11.5* 11/09/2012   HCT 34.3* 11/09/2012   MCV 82.9  11/09/2012   PLT 181 11/09/2012     Recent Labs Lab 11/08/12 1638 11/09/12 0345 11/09/12 1535  NA 133* 129*  --   K 4.0 3.8  --   CL 98 95*  --   CO2 22 24  --   BUN 15 13  --   CREATININE 0.93 0.78 0.85  CALCIUM 9.7 9.2  --   PROT 6.9  --   --   BILITOT 0.2*  --   --   ALKPHOS 83  --   --   ALT 23  --   --   AST 22  --   --   GLUCOSE 150* 187*  --     Recent Labs  11/08/12 1638  INR 0.98   Cardiac Cath: 11/10/2012 Left mainstem: The left main coronary is heavily calcified. It has mild tapering distally up to 30%.  Left anterior descending (LAD): The left anterior descending artery is heavily calcified in the proximal vessel. There is a 90% proximal stenosis followed by complete occlusion following a large septal perforator branch.  Left circumflex (LCx): The first and second obtuse marginal vessels are occluded. The left circumflex is diffusely diseased throughout the mid vessel up to 70%. The distal vessel is occluded.  Right coronary artery (RCA): The right coronary is small and diffusely diseased up to 90% throughout the proximal vessel. It is occluded in the mid vessel. There are bridging right to right collaterals as well as left to right collaterals.  The saphenous vein graft to the obtuse marginal vessels  is occluded.  The saphenous vein graft to the ramus intermediate branch is widely patent. The stented segment in the proximal vessel is without significant disease.  The LIMA graft to the LAD is widely patent with good runoff. There is a 50% stenosis in the LAD following the graft touchdown.  Left ventriculography: Left ventricular systolic function is normal, LVEF is estimated at 55-60%, there is mild inferior basal hypokinesis,there is no significant mitral regurgitation  Final Conclusions:  1. Severe three-vessel obstructive coronary disease.  2. The saphenous vein graft to the ramus intermediate branch is patent.  3. Patent LIMA graft to the LAD.  4. Occluded  saphenous vein graft to the obtuse marginal vessels.  5. Good left ventricular function.  Recommendations: There are no new lesions or recurrent stenosis to explain this patient's chest pain symptoms. I would continue medical management.  EKG: 10-Nov-2012 03:43:06 Normal sinus rhythm T wave abnormality, consider inferior ischemia Abnormal ECG No significant change since last tracing 73mm/s 24mm/mV 100Hz  8.0.1 12SL 241 HD CID: 1 Referred by: Confirmed By: Arvilla Meres MD Vent. rate 75 BPM PR interval 196 ms QRS duration 88 ms QT/QTc 400/446 ms P-R-T axes 69 63 27  FOLLOW UP PLANS AND APPOINTMENTS No Known Allergies   Medication List    TAKE these medications       albuterol 108 (90 BASE) MCG/ACT inhaler  Commonly known as:  PROVENTIL HFA;VENTOLIN HFA  Inhale 2 puffs into the lungs every 6 (six) hours as needed. For shortness of breath     ALLERGY RELEAF SYSTEM PO  Take 1 tablet by mouth as needed.     aspirin 325 MG tablet  Take 325 mg by mouth daily.     atorvastatin 80 MG tablet  Commonly known as:  LIPITOR  Take 1 tablet (80 mg total) by mouth daily at 6 PM.     B-COMPLEX PO  Take 1 tablet by mouth daily.     calcium carbonate 500 MG chewable tablet  Commonly known as:  TUMS - dosed in mg elemental calcium  Chew 1 tablet by mouth 4 (four) times daily - after meals and at bedtime.     carvedilol 3.125 MG tablet  Commonly known as:  COREG  Take 1 tablet (3.125 mg total) by mouth 2 (two) times daily with a meal.     COD LIVER OIL PO  Take 1 tablet by mouth daily.     fish oil-omega-3 fatty acids 1000 MG capsule  Take 1 g by mouth daily.     isosorbide mononitrate 30 MG 24 hr tablet  Commonly known as:  IMDUR  Take 30 mg by mouth daily.     metFORMIN 500 MG tablet  Commonly known as:  GLUCOPHAGE  Take 1 tablet (500 mg total) by mouth 2 (two) times daily. HOLD 48 hours, restart on 11/13/2012.  MAY TAKE 1 1/2 TAB IF NEEDED, PT ADJUSTS THIS MED HIMSELF       multivitamin with minerals Tabs  Take 1 tablet by mouth daily.     nitrofurantoin 100 MG capsule  Commonly known as:  MACRODANTIN  Take 100 mg by mouth at bedtime.     nitroGLYCERIN 0.4 MG SL tablet  Commonly known as:  NITROSTAT  Place 1 tablet (0.4 mg total) under the tongue every 5 (five) minutes x 3 doses as needed for chest pain.     omeprazole 40 MG capsule  Commonly known as:  PRILOSEC  Take 40 mg by mouth daily.  potassium chloride 10 MEQ tablet  Commonly known as:  K-DUR,KLOR-CON  Take 1 tablet (10 mEq total) by mouth daily.     sertraline 50 MG tablet  Commonly known as:  ZOLOFT  Take 25 mg by mouth daily.     STOOL SOFTENER 100 MG capsule  Generic drug:  Docusate Sodium  Take 100 mg by mouth at bedtime. Constipation     Ticagrelor 90 MG Tabs tablet  Commonly known as:  BRILINTA  Take 1 tablet (90 mg total) by mouth 2 (two) times daily.     TRADJENTA 5 MG Tabs tablet  Generic drug:  linagliptin  Take 5 mg by mouth daily.        Discharge Orders   Future Appointments Provider Department Dept Phone   11/18/2012 1:00 PM Kathlen Brunswick, MD [facility] [phone]   Future Orders Complete By Expires     Diet - low sodium heart healthy  As directed     Diet Carb Modified  As directed     Increase activity slowly  As directed         BRING ALL MEDICATIONS WITH YOU TO FOLLOW UP APPOINTMENTS  Time spent with patient to include physician time: 32 min Signed: Theodore Demark, PA-C 11/11/2012, 9:22 AM Co-Sign MD

## 2012-11-11 NOTE — Progress Notes (Signed)
TELEMETRY: Reviewed telemetry pt in NSR: Filed Vitals:   11/10/12 1339 11/10/12 1500 11/10/12 2100 11/11/12 0500  BP: 107/61  144/70 123/60  Pulse: 81 84 69 68  Temp: 98.1 F (36.7 C)  98.2 F (36.8 C) 98.3 F (36.8 C)  TempSrc: Oral     Resp: 18  18 18   Height:      Weight:      SpO2: 95% 97% 97% 96%    Intake/Output Summary (Last 24 hours) at 11/11/12 0820 Last data filed at 11/10/12 1700  Gross per 24 hour  Intake   1080 ml  Output      0 ml  Net   1080 ml    SUBJECTIVE Patient feels much better today. Stronger. Denies any chest pain.  LABS: Basic Metabolic Panel:  Recent Labs  16/10/96 1638 11/09/12 0345 11/09/12 1535  NA 133* 129*  --   K 4.0 3.8  --   CL 98 95*  --   CO2 22 24  --   GLUCOSE 150* 187*  --   BUN 15 13  --   CREATININE 0.93 0.78 0.85  CALCIUM 9.7 9.2  --    Liver Function Tests:  Recent Labs  11/08/12 1638  AST 22  ALT 23  ALKPHOS 83  BILITOT 0.2*  PROT 6.9  ALBUMIN 3.8   CBC:  Recent Labs  11/08/12 1638 11/09/12 0345 11/09/12 1535  WBC 7.6 7.1 8.0  NEUTROABS 4.9  --   --   HGB 11.4* 11.4* 11.5*  HCT 33.6* 33.9* 34.3*  MCV 84.0 83.9 82.9  PLT 188 176 181   Cardiac Enzymes:  Recent Labs  11/08/12 1638 11/08/12 2201 11/09/12 0344  TROPONINI <0.30 <0.30 <0.30     Radiology/Studies:  Dg Chest 2 View  11/05/2012  *RADIOLOGY REPORT*  Clinical Data: Chest pain  CHEST - 2 VIEW  Comparison: November 04, 2012.  Findings: Sternotomy wires are noted.  Cardiomediastinal silhouette appears normal.  No acute pulmonary disease is noted.  Bony thorax is intact.  No pleural effusion or pneumothorax is noted.  IMPRESSION: No acute cardiopulmonary abnormality seen.   Original Report Authenticated By: Lupita Raider.,  M.D.    Dg Chest 2 View  11/04/2012  *RADIOLOGY REPORT*  Clinical Data: Weakness and hypoglycemia.  Ex-smoker.  Diabetes.  CHEST - 2 VIEW  Comparison: 10/21/2012  Findings: Mild hyperinflation. Lateral view degraded  by patient arm position.  Prior median sternotomy. Midline trachea.  Normal heart size without the atherosclerosis in the transverse aorta.  Tortuous descending thoracic aorta. No pleural effusion or pneumothorax. Clear lungs.  No congestive failure.  IMPRESSION: Hyperinflation, without acute disease.   Original Report Authenticated By: Jeronimo Greaves, M.D.    Dg Chest Port 1 View  10/21/2012  *RADIOLOGY REPORT*  Clinical Data: Chest pain  PORTABLE CHEST - 1 VIEW  Comparison: 10/16/2012  Findings: Normal heart size.  Clear lungs.  No pneumothorax.  No acute bony deformity.  No pleural effusion.  Postoperative changes.  IMPRESSION: No active cardiopulmonary disease.   Original Report Authenticated By: Jolaine Click, M.D.    Dg Chest Port 1 View  10/16/2012  *RADIOLOGY REPORT*  Clinical Data: Chest pain.  PORTABLE CHEST - 1 VIEW  Comparison: 10/06/2012  Findings: Stable postoperative changes in the mediastinum.  Normal heart size and pulmonary vascularity.  Mild hyperinflation.  No focal consolidation in the lungs.  No blunting of costophrenic angles.  No pneumothorax.  Mediastinal contours appear intact.  No significant  change since previous study.  IMPRESSION: No evidence of active pulmonary disease.   Original Report Authenticated By: Burman Nieves, M.D.    Ecg: NSR with mild T wave inversion inferiorly.  PHYSICAL EXAM General: Well developed, elderly, in no acute distress. Head: Normal. edentulous Neck: Negative for carotid bruits. JVD not elevated. Lungs: Clear bilaterally to auscultation without wheezes, rales, or rhonchi. Breathing is unlabored. Heart: RRR S1 S2 without murmurs, rubs, or gallops.  Abdomen: Soft, non-tender, non-distended with normoactive bowel sounds.  Extremities: No clubbing, cyanosis or edema.  Distal pedal pulses are 2+ and equal bilaterally. Neuro: Alert and oriented X 3. Moves all extremities spontaneously. Psych:  Anxious about returning home.  ASSESSMENT AND PLAN: 1.  Chest pain. S/p CABG. S/p stent of SVG to ramus. Cath yesterday shows no new disease. Stent in SVG to ramus still patent. Continue medical management. 2. DM type 2 3. Hyperlipidemia. 4. HTN controlled. 5. History of CVA 6. Chronic back pain. 7. Weakness- appreciate PT/OT evaluation. Will plan on DC today with HHPT/OT services.  Principal Problem:   Chest pain Active Problems:   Hyperlipidemia   Arteriosclerotic cardiovascular disease (ASCVD)   Diabetes mellitus, type II   Hypertension   Cerebrovascular disease   Cardiomyopathy, ischemic    Signed, Jhase Creppel [place] MD,FACC 11/11/2012 8:20 AM

## 2012-11-11 NOTE — Progress Notes (Signed)
Physical Therapy Treatment Patient Details Name: KEEDAN SAMPLE MRN: [id number] DOB: 10/11/1928 Today's Date: 11/11/2012 Time: 0835-0900 PT Time Calculation (min): 25 min  PT Assessment / Plan / Recommendation Comments on Treatment Session  Pt s/p CP with decr mobility secondary to decr endurance and decr balance.  Will benefit from PT to address endurance and balance.  Pt aware that PT recommends use of RW on d/c for safety in the home in uncontrolled environment.  HHPT recommended.  Pt inquired about Meals on Wheels as he states that it was supposed to be arranged after last visit to hospital.  Will check with CM.      Follow Up Recommendations  Home health PT                 Equipment Recommendations  None recommended by PT        Frequency Min 3X/week   Plan Discharge plan remains appropriate;Frequency remains appropriate    Precautions / Restrictions Precautions Precautions: Fall Precaution Comments: has not fallen at home Restrictions Weight Bearing Restrictions: No   Pertinent Vitals/Pain VSS, No pain    Mobility  Bed Mobility Bed Mobility: Supine to Sit;Sit to Supine Supine to Sit: 7: Independent Sit to Supine: 7: Independent Transfers Transfers: Sit to Stand;Stand to Sit Sit to Stand: 7: Independent Stand to Sit: 7: Independent Ambulation/Gait Ambulation/Gait Assistance: 6: Modified independent (Device/Increase time) Ambulation Distance (Feet): 225 Feet Assistive device: Rolling walker Ambulation/Gait Assistance Details: Pt ambulated with RW.  He showed this PT that he really didn't need the RW at one point however PT recommends using it due to his fatigue level without the RW.  Pt occasionally staggers without the RW but self corrected every time.  Pt cautioned to slow down and take his time as he has decr safety when he rushed.   Gait Pattern: Within Functional Limits Gait velocity: WFL General Gait Details: vc's for use of RW and staying close to it as  well as keeping it on the ground with turning Stairs: No Wheelchair Mobility Wheelchair Mobility: No    PT Goals Acute Rehab PT Goals PT Goal: Ambulate - Progress: Progressing toward goal  Visit Information  Last PT Received On: 11/11/12 Assistance Needed: +1    Subjective Data  Subjective: "I do fine with and without the walker."   Cognition  Cognition Arousal/Alertness: Awake/alert Behavior During Therapy: WFL for tasks assessed/performed Overall Cognitive Status: Within Functional Limits for tasks assessed    Balance  Dynamic Standing Balance Dynamic Standing - Balance Support: No upper extremity supported;During functional activity Dynamic Standing - Level of Assistance: 5: Stand by assistance Dynamic Standing - Balance Activities: Lateral lean/weight shifting;Forward lean/weight shifting;Reaching across midline Dynamic Standing - Comments: Unsteady but as stated above, pt self corrects with LOB.   End of Session PT - End of Session Equipment Utilized During Treatment: Gait belt Activity Tolerance: Patient tolerated treatment well;Patient limited by fatigue Patient left: in bed;with call bell/phone within reach Nurse Communication: Mobility status       INGOLD,Idamae Coccia 11/11/2012, 9:54 AM [facility] [phone] (pager)

## 2012-11-15 ENCOUNTER — Emergency Department (HOSPITAL_COMMUNITY)
Admission: EM | Admit: 2012-11-15 | Discharge: 2012-11-15 | Disposition: A | Discharge status: Home / Self Care | Payer: Medicare Other | Attending: Emergency Medicine | Admitting: Emergency Medicine

## 2012-11-15 ENCOUNTER — Emergency Department (HOSPITAL_COMMUNITY): Payer: Medicare Other

## 2012-11-15 ENCOUNTER — Encounter (HOSPITAL_COMMUNITY): Payer: Self-pay | Admitting: *Deleted

## 2012-11-15 DIAGNOSIS — Z87891 Personal history of nicotine dependence: Secondary | ICD-10-CM | POA: Insufficient documentation

## 2012-11-15 DIAGNOSIS — E785 Hyperlipidemia, unspecified: Secondary | ICD-10-CM | POA: Insufficient documentation

## 2012-11-15 DIAGNOSIS — Z951 Presence of aortocoronary bypass graft: Secondary | ICD-10-CM | POA: Insufficient documentation

## 2012-11-15 DIAGNOSIS — Z8711 Personal history of peptic ulcer disease: Secondary | ICD-10-CM | POA: Insufficient documentation

## 2012-11-15 DIAGNOSIS — M549 Dorsalgia, unspecified: Secondary | ICD-10-CM | POA: Insufficient documentation

## 2012-11-15 DIAGNOSIS — Z79899 Other long term (current) drug therapy: Secondary | ICD-10-CM | POA: Insufficient documentation

## 2012-11-15 DIAGNOSIS — I10 Essential (primary) hypertension: Secondary | ICD-10-CM | POA: Insufficient documentation

## 2012-11-15 DIAGNOSIS — Z87448 Personal history of other diseases of urinary system: Secondary | ICD-10-CM | POA: Insufficient documentation

## 2012-11-15 DIAGNOSIS — K219 Gastro-esophageal reflux disease without esophagitis: Secondary | ICD-10-CM | POA: Insufficient documentation

## 2012-11-15 DIAGNOSIS — D649 Anemia, unspecified: Secondary | ICD-10-CM | POA: Insufficient documentation

## 2012-11-15 DIAGNOSIS — F3289 Other specified depressive episodes: Secondary | ICD-10-CM | POA: Insufficient documentation

## 2012-11-15 DIAGNOSIS — Z8701 Personal history of pneumonia (recurrent): Secondary | ICD-10-CM | POA: Insufficient documentation

## 2012-11-15 DIAGNOSIS — R5381 Other malaise: Secondary | ICD-10-CM | POA: Insufficient documentation

## 2012-11-15 DIAGNOSIS — R5383 Other fatigue: Secondary | ICD-10-CM | POA: Insufficient documentation

## 2012-11-15 DIAGNOSIS — G8929 Other chronic pain: Secondary | ICD-10-CM | POA: Insufficient documentation

## 2012-11-15 DIAGNOSIS — I252 Old myocardial infarction: Secondary | ICD-10-CM | POA: Insufficient documentation

## 2012-11-15 DIAGNOSIS — Z9861 Coronary angioplasty status: Secondary | ICD-10-CM | POA: Insufficient documentation

## 2012-11-15 DIAGNOSIS — F329 Major depressive disorder, single episode, unspecified: Secondary | ICD-10-CM

## 2012-11-15 DIAGNOSIS — Z8781 Personal history of (healed) traumatic fracture: Secondary | ICD-10-CM | POA: Insufficient documentation

## 2012-11-15 DIAGNOSIS — Z8674 Personal history of sudden cardiac arrest: Secondary | ICD-10-CM | POA: Insufficient documentation

## 2012-11-15 DIAGNOSIS — Z8679 Personal history of other diseases of the circulatory system: Secondary | ICD-10-CM | POA: Insufficient documentation

## 2012-11-15 DIAGNOSIS — M1990 Unspecified osteoarthritis, unspecified site: Secondary | ICD-10-CM | POA: Insufficient documentation

## 2012-11-15 DIAGNOSIS — Z7982 Long term (current) use of aspirin: Secondary | ICD-10-CM | POA: Insufficient documentation

## 2012-11-15 DIAGNOSIS — Z8673 Personal history of transient ischemic attack (TIA), and cerebral infarction without residual deficits: Secondary | ICD-10-CM | POA: Insufficient documentation

## 2012-11-15 DIAGNOSIS — E119 Type 2 diabetes mellitus without complications: Secondary | ICD-10-CM | POA: Insufficient documentation

## 2012-11-15 LAB — CBC WITH DIFFERENTIAL/PLATELET
Basophils Absolute: 0 10*3/uL (ref 0.0–0.1)
Basophils Relative: 0 % (ref 0–1)
Eosinophils Absolute: 0.2 10*3/uL (ref 0.0–0.7)
Eosinophils Relative: 2 % (ref 0–5)
HCT: 33.9 % — ABNORMAL LOW (ref 39.0–52.0)
Hemoglobin: 11.4 g/dL — ABNORMAL LOW (ref 13.0–17.0)
MCH: 28.4 pg (ref 26.0–34.0)
MCHC: 33.6 g/dL (ref 30.0–36.0)
MCV: 84.5 fL (ref 78.0–100.0)
Monocytes Absolute: 0.9 10*3/uL (ref 0.1–1.0)
Monocytes Relative: 11 % (ref 3–12)
Neutro Abs: 5.8 10*3/uL (ref 1.7–7.7)
RDW: 14.6 % (ref 11.5–15.5)

## 2012-11-15 LAB — COMPREHENSIVE METABOLIC PANEL
AST: 19 U/L (ref 0–37)
Albumin: 3.8 g/dL (ref 3.5–5.2)
BUN: 15 mg/dL (ref 6–23)
Calcium: 9.7 mg/dL (ref 8.4–10.5)
Creatinine, Ser: 0.95 mg/dL (ref 0.50–1.35)
Total Bilirubin: 0.2 mg/dL — ABNORMAL LOW (ref 0.3–1.2)
Total Protein: 7.2 g/dL (ref 6.0–8.3)

## 2012-11-15 LAB — TROPONIN I: Troponin I: <0.3 ng/mL (ref <0.30)

## 2012-11-15 LAB — PROTIME-INR
INR: 0.94 (ref 0.00–1.49)
Prothrombin Time: 12.5 seconds (ref 11.6–15.2)

## 2012-11-15 NOTE — ED Provider Notes (Signed)
History       CSN: [id number]  Arrival date & time 11/15/12  1322      Chief Complaint  Patient presents with  . Chest Pain     HPI Comments: Jeffrey [name] is a 79 y.o. Male who states he has been weak ever since. His heart attack and stent one month ago. He drove himself to the emergency department. At triage, he had to sit in a wheelchair because he felt like he would fall down. He reports that he lives alone. He is eating fairly well. He thinks that he might be depressed. His daughter lives in town, but she is debilitated. He doesn't get out much. He took a nitroglycerin and aspirin today because he thought it might help. He's been back to see his cardiologist since discharge from the hospital. He denies chest pain, shortness of breath, cough, nausea, vomiting, change in bowel or urinary habits. No other known modifying factors.  The patient has had several emergency department visits since his MI and stents. Last week. He saw his cardiologist and was subsequently admitted. He had a repeat cardiac catheterization, which was stable, unchanged from the time when he had his stent. He was told to continue medical management.  The history is provided by the patient and medical records. No language interpreter was used.     Past Medical History  Diagnosis Date  . Diabetes mellitus, type II   . Hyperlipidemia     Lipid profile in 02/2012:135, 227, 41, 49  . Arteriosclerotic cardiovascular disease (ASCVD)     a. CABG x 4 in 1989 (VG->OM1->OM2, VG->RCA, LIMA->LAD), b. 05/2010: DES to VG-OM1/OM2, DES to distal LCx. c. NSTEMI in 04/2011 - TO distal LCX stent and VG->OM2. d. 02/2012 NSTEMI DES to VG-OM1/continuation to OM2 occluded. e. inferior STEMI s/p DES to SVG-RAMUS 06/2012. f. inferolat STEMI 09/2012 s/p DES to SVG-interm.  . Hypertension   . Osteoarthritis   . CVA (cerebral infarction)     details unclear. pt reports light stroke last year involving L leg. No imaging to suggest  hemorrhagic etiology  . Pneumonia   . Cervical vertebral fracture   . Chronic back pain   . Benign prostatic hypertrophy     Required urinary catheter x4 weeks for retention  . Peptic ulcer disease   . Gastroesophageal reflux disease   . Ischemic cardiomyopathy     EF 45-50% 3/32014  . DM (diabetes mellitus)   . Anemia     Past Surgical History  Procedure Laterality Date  . Coronary angioplasty with stent placement      2011, 02/2012  . Tonsillectomy    . Coronary artery bypass graft      Family History  Problem Relation Age of Onset  . Early death      Parents died young  . Appendicitis Mother     Pt was 54 year old  . Heart attack Father 69    History  Substance Use Topics  . Smoking status: Former Smoker -- 2.00 packs/day for 10 years    Types: Cigarettes    Quit date: 07/14/1961  . Smokeless tobacco: Current User    Types: Chew  . Alcohol Use: No      Review of Systems  All other systems reviewed and are negative.    Allergies  Review of patient's allergies indicates no known allergies.  Home Medications   Current Outpatient Rx  Name  Route  Sig  Dispense  Refill  . albuterol (  PROVENTIL HFA;VENTOLIN HFA) 108 (90 BASE) MCG/ACT inhaler   Inhalation   Inhale 2 puffs into the lungs every 6 (six) hours as needed. For shortness of breath         . aspirin 325 MG tablet   Oral   Take 325 mg by mouth daily.         Marland Kitchen atorvastatin (LIPITOR) 80 MG tablet   Oral   Take 1 tablet (80 mg total) by mouth daily at 6 PM.   30 tablet   3   . B Complex-Biotin-FA (B-COMPLEX PO)   Oral   Take 1 tablet by mouth daily.          . calcium carbonate (TUMS - DOSED IN MG ELEMENTAL CALCIUM) 500 MG chewable tablet   Oral   Chew 1 tablet by mouth 4 (four) times daily - after meals and at bedtime.         . COD LIVER OIL PO   Oral   Take 1 tablet by mouth daily.         Tery Sanfilippo Sodium (STOOL SOFTENER) 100 MG capsule   Oral   Take 100 mg by mouth at  bedtime. Constipation         . fish oil-omega-3 fatty acids 1000 MG capsule   Oral   Take 1 g by mouth daily.         . isosorbide mononitrate (IMDUR) 30 MG 24 hr tablet   Oral   Take 30 mg by mouth daily.          Marland Kitchen linagliptin (TRADJENTA) 5 MG TABS tablet   Oral   Take 5 mg by mouth daily.         . metFORMIN (GLUCOPHAGE) 500 MG tablet   Oral   Take 1 tablet (500 mg total) by mouth 2 (two) times daily. HOLD 48 hours, restart on 11/13/2012. MAY TAKE 1 1/2 TAB IF NEEDED, PT ADJUSTS THIS MED HIMSELF         . Misc Natural Products (ALLERGY RELEAF SYSTEM PO)   Oral   Take 1 tablet by mouth as needed.         . Multiple Vitamin (MULTIVITAMIN WITH MINERALS) TABS   Oral   Take 1 tablet by mouth daily.         . nitrofurantoin (MACRODANTIN) 100 MG capsule   Oral   Take 100 mg by mouth at bedtime.         . nitroGLYCERIN (NITROSTAT) 0.4 MG SL tablet   Sublingual   Place 1 tablet (0.4 mg total) under the tongue every 5 (five) minutes x 3 doses as needed for chest pain.   25 tablet   3   . omeprazole (PRILOSEC) 40 MG capsule   Oral   Take 40 mg by mouth daily.         . potassium chloride (K-DUR,KLOR-CON) 10 MEQ tablet   Oral   Take 1 tablet (10 mEq total) by mouth daily.   30 tablet   3   . sertraline (ZOLOFT) 50 MG tablet   Oral   Take 25 mg by mouth daily.         . Ticagrelor (BRILINTA) 90 MG TABS tablet   Oral   Take 1 tablet (90 mg total) by mouth 2 (two) times daily.   60 tablet   6     BP 126/66  Pulse 80  Temp(Src) 97.4 F (36.3 C) (Oral)  Resp 20  Ht 5'  10" (1.778 m)  Wt 170 lb (77.111 kg)  BMI 24.39 kg/m2  SpO2 100%  Physical Exam  Nursing note and vitals reviewed. Constitutional: He is oriented to person, place, and time. He appears well-developed and well-nourished.  HENT:  Head: Normocephalic and atraumatic.  Right Ear: External ear normal.  Left Ear: External ear normal.  Eyes: Conjunctivae and EOM are normal. Pupils  are equal, round, and reactive to light.  Neck: Normal range of motion and phonation normal. Neck supple.  Cardiovascular: Normal rate, regular rhythm, normal heart sounds and intact distal pulses.   Pulmonary/Chest: Effort normal and breath sounds normal. He exhibits no bony tenderness.  Abdominal: Soft. Normal appearance. There is no tenderness.  Musculoskeletal: Normal range of motion.  Neurological: He is alert and oriented to person, place, and time. He has normal strength. No cranial nerve deficit or sensory deficit. He exhibits normal muscle tone. Coordination normal.  Skin: Skin is warm, dry and intact.  Psychiatric: His behavior is normal. Judgment and thought content normal.  He appears to    ED Course  Procedures (including critical care time) DIAGNOSTIC STUDIES: Oxygen Saturation is 100% on RA, {normal,  by my interpretation.   Filed Vitals:   11/15/12 1606 11/15/12 1816 11/15/12 1817 11/15/12 1818  BP: 140/71 114/62 112/65 94/51  Pulse: 76 81 84 86  Temp:      TempSrc:      Resp:      Height:      Weight:      SpO2: 100%         Date: 11/15/12  Rate: 90  Rhythm: normal sinus rhythm  QRS Axis: normal  PR and QT Intervals: normal  ST/T Wave abnormalities: nonspecific ST/T changes  PR and QRS Conduction Disutrbances:none  Narrative Interpretation:   Old EKG Reviewed: unchanged- 11/10/12   COORDINATION OF CARE: 12:48 AM Discussed ED treatment with pt and pt agrees.   16:15- I discussed the case with his primary care doctor, Sherwood Gambler. He can see the patient tomorrow for a checkup. We discussed the indications for treatment of his apparent depression.  At discharge she was able to tolerate liquids and food and  Able to ambulate with somewhat improved Vital signs.  Labs Reviewed  CBC WITH DIFFERENTIAL - Abnormal; Notable for the following:    RBC 4.01 (*)    Hemoglobin 11.4 (*)    HCT 33.9 (*)    All other components within normal limits  COMPREHENSIVE METABOLIC  PANEL - Abnormal; Notable for the following:    Sodium 133 (*)    Glucose, Bld 181 (*)    Total Bilirubin 0.2 (*)    GFR calc non Af Amer 76 (*)    GFR calc Af Amer 88 (*)    All other components within normal limits  GLUCOSE, CAPILLARY - Abnormal; Notable for the following:    Glucose-Capillary 139 (*)    All other components within normal limits  TROPONIN I  PROTIME-INR   Dg Chest 2 View  11/15/2012  *RADIOLOGY REPORT*  Clinical Data: Chest pain  CHEST - 2 VIEW  Comparison: 11/05/2012; 10/21/2012; 10/16/2012  Findings:  Grossly unchanged cardiac silhouette and mediastinal contours post median sternotomy and CABG.  Atherosclerotic calcifications within a mildly tortuous thoracic aorta.  The lungs appear mildly hyperexpanded with flattening of bilateral hemidiaphragms and mild diffuse thickening of the pulmonary interstitium.  No new focal airspace opacity.  No pleural effusion or pneumothorax.  No definite evidence of edema.  Unchanged bones.  IMPRESSION: Mild lung hyperexpansion without acute cardiopulmonary disease.   Original Report Authenticated By: Tacey Ruiz, MD      1. Malaise   2. Depression       MDM  Malaise with cardiac disease. Mild orthostatic symptoms related to his use of nitroglycerin and Beta-blocker. ACS, PE, or pneumonia. Doubt metabolic instability, serious bacterial infection or impending vascular collapse; the patient is stable for discharge.  Nursing Notes Reviewed/ Care Coordinated, and agree without changes. Applicable Imaging Reviewed.  Interpretation of Laboratory Data incorporated into ED treatment     Plan: Home Medications- Stop Carvedilol;  Home Treatments- rest; Recommended follow up- PCP and Cardiology in 1-2 days     [name], MD 11/16/12 [phone]

## 2012-11-15 NOTE — ED Notes (Signed)
Pt ambulated from stretcher approximately 50 feet across nurse's station to restroom and back. nad noted.

## 2012-11-15 NOTE — ED Notes (Signed)
EKG performed in triage

## 2012-11-15 NOTE — ED Notes (Signed)
Chest pain ,1 hour  Ago, Took 1 ntg and 1 aspirin pta.  Feels sob, sl pale

## 2012-11-18 ENCOUNTER — Ambulatory Visit (INDEPENDENT_AMBULATORY_CARE_PROVIDER_SITE_OTHER): Payer: Medicare Other | Admitting: Cardiology

## 2012-11-18 ENCOUNTER — Encounter: Payer: Self-pay | Admitting: Cardiology

## 2012-11-18 VITALS — BP 124/60 | HR 95 | Ht 70.0 in | Wt 177.0 lb

## 2012-11-18 DIAGNOSIS — I10 Essential (primary) hypertension: Secondary | ICD-10-CM

## 2012-11-18 DIAGNOSIS — I2510 Atherosclerotic heart disease of native coronary artery without angina pectoris: Secondary | ICD-10-CM

## 2012-11-18 DIAGNOSIS — I2589 Other forms of chronic ischemic heart disease: Secondary | ICD-10-CM

## 2012-11-18 DIAGNOSIS — I951 Orthostatic hypotension: Secondary | ICD-10-CM

## 2012-11-18 DIAGNOSIS — I7090 Unspecified atherosclerosis: Secondary | ICD-10-CM

## 2012-11-18 DIAGNOSIS — I255 Ischemic cardiomyopathy: Secondary | ICD-10-CM

## 2012-11-18 DIAGNOSIS — D649 Anemia, unspecified: Secondary | ICD-10-CM

## 2012-11-18 DIAGNOSIS — E785 Hyperlipidemia, unspecified: Secondary | ICD-10-CM

## 2012-11-18 MED ORDER — CARVEDILOL 12.5 MG PO TABS: 12.5000 mg | ORAL_TABLET | Freq: Two times a day (BID) | ORAL | Status: DC

## 2012-11-18 NOTE — Progress Notes (Deleted)
Name: Jeffrey [name]    DOB: [birth year]  Age: 79 y.o.  MR#: [id number]       PCP:  [name], MD      Insurance: Payor: MEDICARE  Plan: MEDICARE PART A AND B  Product Type: *No Product type*    CC:   No chief complaint on file.  LIST REVIEWED VS Filed Vitals:   11/18/12 1313  BP: 124/60  Pulse: 95  Height: 5\' 10"  (1.778 m)  Weight: 177 lb (80.287 kg)  SpO2: 97%    Weights Current Weight  11/18/12 177 lb (80.287 kg)  11/15/12 170 lb (77.111 kg)  11/09/12 177 lb 9.6 oz (80.559 kg)    Blood Pressure  BP Readings from Last 3 Encounters:  11/18/12 124/60  11/15/12 94/51  11/11/12 123/60     Admit date:  (Not on file) Last encounter with RMR:  10/08/2012   Allergy Review of patient's allergies indicates no known allergies.  Current Outpatient Prescriptions  Medication Sig Dispense Refill  . albuterol (PROVENTIL HFA;VENTOLIN HFA) 108 (90 BASE) MCG/ACT inhaler Inhale 2 puffs into the lungs every 6 (six) hours as needed. For shortness of breath      . aspirin 325 MG tablet Take 325 mg by mouth daily.      Marland Kitchen atorvastatin (LIPITOR) 80 MG tablet Take 1 tablet (80 mg total) by mouth daily at 6 PM.  30 tablet  3  . B Complex-Biotin-FA (B-COMPLEX PO) Take 1 tablet by mouth daily.       Marland Kitchen buPROPion (WELLBUTRIN) 100 MG tablet       . calcium carbonate (TUMS - DOSED IN MG ELEMENTAL CALCIUM) 500 MG chewable tablet Chew 1 tablet by mouth 4 (four) times daily - after meals and at bedtime.      . carvedilol (COREG) 3.125 MG tablet       . COD LIVER OIL PO Take 1 tablet by mouth daily.      Tery Sanfilippo Sodium (STOOL SOFTENER) 100 MG capsule Take 100 mg by mouth at bedtime. Constipation      . fish oil-omega-3 fatty acids 1000 MG capsule Take 1 g by mouth daily.      . isosorbide mononitrate (IMDUR) 30 MG 24 hr tablet Take 30 mg by mouth daily.       Marland Kitchen linagliptin (TRADJENTA) 5 MG TABS tablet Take 5 mg by mouth daily.      . metFORMIN (GLUCOPHAGE) 500 MG tablet Take 1 tablet (500 mg  total) by mouth 2 (two) times daily. HOLD 48 hours, restart on 11/13/2012. MAY TAKE 1 1/2 TAB IF NEEDED, PT ADJUSTS THIS MED HIMSELF      . Misc Natural Products (ALLERGY RELEAF SYSTEM PO) Take 1 tablet by mouth as needed.      . Multiple Vitamin (MULTIVITAMIN WITH MINERALS) TABS Take 1 tablet by mouth daily.      . nitrofurantoin (MACRODANTIN) 100 MG capsule Take 100 mg by mouth at bedtime.      . nitroGLYCERIN (NITROSTAT) 0.4 MG SL tablet Place 1 tablet (0.4 mg total) under the tongue every 5 (five) minutes x 3 doses as needed for chest pain.  25 tablet  3  . omeprazole (PRILOSEC) 40 MG capsule Take 40 mg by mouth daily.      . potassium chloride (K-DUR,KLOR-CON) 10 MEQ tablet Take 1 tablet (10 mEq total) by mouth daily.  30 tablet  3  . sertraline (ZOLOFT) 50 MG tablet Take 25 mg by mouth daily.      Marland Kitchen  Ticagrelor (BRILINTA) 90 MG TABS tablet Take 1 tablet (90 mg total) by mouth 2 (two) times daily.  60 tablet  6   No current facility-administered medications for this visit.    Discontinued Meds:   There are no discontinued medications.  Patient Active Problem List   Diagnosis Date Noted  . Anemia   . Angina, class III 11/08/2012  . ST elevation myocardial infarction (STEMI) of inferolateral wall, initial episode of care 10/06/2012  . Orthostatic hypotension 06/24/2012  . Cardiomyopathy, ischemic 06/21/2012  . Cerebrovascular disease 06/08/2012  . Diabetes mellitus, type II   . Hypertension   . Arteriosclerotic cardiovascular disease (ASCVD) 10/25/2011  . Hyperlipidemia 10/24/2011  . BPH (benign prostatic hyperplasia) 05/07/2011    LABS    Component Value Date/Time   NA 133* 11/15/2012 1342   NA 129* 11/09/2012 0345   NA 133* 11/08/2012 1638   K 4.4 11/15/2012 1342   K 3.8 11/09/2012 0345   K 4.0 11/08/2012 1638   CL 97 11/15/2012 1342   CL 95* 11/09/2012 0345   CL 98 11/08/2012 1638   CO2 22 11/15/2012 1342   CO2 24 11/09/2012 0345   CO2 22 11/08/2012 1638   GLUCOSE 181* 11/15/2012 1342    GLUCOSE 187* 11/09/2012 0345   GLUCOSE 150* 11/08/2012 1638   BUN 15 11/15/2012 1342   BUN 13 11/09/2012 0345   BUN 15 11/08/2012 1638   CREATININE 0.95 11/15/2012 1342   CREATININE 0.85 11/09/2012 1535   CREATININE 0.78 11/09/2012 0345   CREATININE 0.98 12/04/2011 1208   CALCIUM 9.7 11/15/2012 1342   CALCIUM 9.2 11/09/2012 0345   CALCIUM 9.7 11/08/2012 1638   GFRNONAA 76* 11/15/2012 1342   GFRNONAA 80* 11/09/2012 1535   GFRNONAA 82* 11/09/2012 0345   GFRAA 88* 11/15/2012 1342   GFRAA >90 11/09/2012 1535   GFRAA >90 11/09/2012 0345   CMP     Component Value Date/Time   NA 133* 11/15/2012 1342   K 4.4 11/15/2012 1342   CL 97 11/15/2012 1342   CO2 22 11/15/2012 1342   GLUCOSE 181* 11/15/2012 1342   BUN 15 11/15/2012 1342   CREATININE 0.95 11/15/2012 1342   CREATININE 0.98 12/04/2011 1208   CALCIUM 9.7 11/15/2012 1342   PROT 7.2 11/15/2012 1342   ALBUMIN 3.8 11/15/2012 1342   AST 19 11/15/2012 1342   ALT 23 11/15/2012 1342   ALKPHOS 97 11/15/2012 1342   BILITOT 0.2* 11/15/2012 1342   GFRNONAA 76* 11/15/2012 1342   GFRAA 88* 11/15/2012 1342       Component Value Date/Time   WBC 7.9 11/15/2012 1342   WBC 8.0 11/09/2012 1535   WBC 7.1 11/09/2012 0345   HGB 11.4* 11/15/2012 1342   HGB 11.5* 11/09/2012 1535   HGB 11.4* 11/09/2012 0345   HCT 33.9* 11/15/2012 1342   HCT 34.3* 11/09/2012 1535   HCT 33.9* 11/09/2012 0345   MCV 84.5 11/15/2012 1342   MCV 82.9 11/09/2012 1535   MCV 83.9 11/09/2012 0345    Lipid Panel     Component Value Date/Time   CHOL 124 10/07/2012 0700   TRIG 211* 10/07/2012 0700   HDL 31* 10/07/2012 0700   CHOLHDL 4.0 10/07/2012 0700   VLDL 42* 10/07/2012 0700   LDLCALC 51 10/07/2012 0700    ABG    Component Value Date/Time   TCO2 23 10/06/2012 1545     Lab Results  Component Value Date   TSH 3.483 10/16/2012   BNP (last 3 results)  Recent  Labs  04/17/12 1521 07/29/12 1340 07/29/12 1855  PROBNP 438.7 863.6* 845.2*   Cardiac Panel (last 3 results)  Recent Labs  11/15/12 1342  TROPONINI <0.30     Iron/TIBC/Ferritin No results found for this basename: iron, tibc, ferritin     EKG Orders placed during the hospital encounter of 11/15/12  . ED EKG  . ED EKG  . EKG 12-LEAD  . EKG 12-LEAD  . EKG     Prior Assessment and Plan Problem List as of 11/18/2012     ICD-9-CM   BPH (benign prostatic hyperplasia)   Last Assessment & Plan   09/09/2012 Office Visit Edited 09/11/2012 11:21 PM by Kathlen Brunswick, MD     Patient advised not to increase his dose of Flomax or other medications without specific instructions and will resume his usual daily dose.  He is scheduled to return to see Dr. Jerre Simon in the near future for catheter removal.    Hyperlipidemia   Last Assessment & Plan   09/09/2012 Office Visit Edited 09/11/2012 11:22 PM by Kathlen Brunswick, MD     Lipid profile a few months ago was excellent except for elevated triglycerides, which can likely be improved with better control of diabetes.    Arteriosclerotic cardiovascular disease (ASCVD)   Last Assessment & Plan   10/15/2012 Office Visit Written 10/15/2012  3:15 PM by Jodelle Gross, NP     This unfortunate gentleman has complex multivessel CAD, with recurrent admissions and PCI most recent in March of 2013 with PTCA and stenting of the SVG to the ramus intermediate. He continues on optimal medical therapy. He has not required use of nitroglycerin. He is still easily fatigued. He has not had any complaints other than recurrent fatigue. We will continue current medical management with close followup in one month. Uncertain life expectancy secondary to multiple interventions and infarct since.    Diabetes mellitus, type II   Last Assessment & Plan   09/09/2012 Office Visit Written 09/09/2012  3:38 PM by Kathlen Brunswick, MD     Probably requires increased therapy for diabetes. Patient will discuss this with Dr. Sherwood Gambler at his next visit.    Hypertension   Last Assessment & Plan   10/15/2012 Office Visit Written 10/15/2012  3:16 PM by  Jodelle Gross, NP     Blood pressure is fair well-controlled on this visit. He will remain on carvedilol 3.125 mg daily Altace 2.5 mg daily and Proventil. With aspirin.    Cerebrovascular disease   Last Assessment & Plan   09/09/2012 Office Visit Written 09/09/2012  3:42 PM by Kathlen Brunswick, MD     Recent carotid ultrasound negative for obstruction. Optimal control of cardiovascular risk factors appears effective in preventing progression of disease and will be continued.    Cardiomyopathy, ischemic   Last Assessment & Plan   08/02/2012 Office Visit Written 08/02/2012  4:02 PM by Jodelle Gross, NP     Will continue on carvedilol, ASA and follow. EF 40% on last cardiac cath in December of 2013. No ACE at this time as he refuses.    Orthostatic hypotension   Last Assessment & Plan   09/09/2012 Office Visit Written 09/09/2012  3:42 PM by Kathlen Brunswick, MD     No symptoms nor evidence for hypotension at present.    ST elevation myocardial infarction (STEMI) of inferolateral wall, initial episode of care   Angina, class III   Anemia  Imaging: Dg Chest 2 View  11/15/2012  *RADIOLOGY REPORT*  Clinical Data: Chest pain  CHEST - 2 VIEW  Comparison: 11/05/2012; 10/21/2012; 10/16/2012  Findings:  Grossly unchanged cardiac silhouette and mediastinal contours post median sternotomy and CABG.  Atherosclerotic calcifications within a mildly tortuous thoracic aorta.  The lungs appear mildly hyperexpanded with flattening of bilateral hemidiaphragms and mild diffuse thickening of the pulmonary interstitium.  No new focal airspace opacity.  No pleural effusion or pneumothorax.  No definite evidence of edema.  Unchanged bones.  IMPRESSION: Mild lung hyperexpansion without acute cardiopulmonary disease.   Original Report Authenticated By: Tacey Ruiz, MD    Dg Chest 2 View  11/05/2012  *RADIOLOGY REPORT*  Clinical Data: Chest pain  CHEST - 2 VIEW  Comparison: November 04, 2012.  Findings:  Sternotomy wires are noted.  Cardiomediastinal silhouette appears normal.  No acute pulmonary disease is noted.  Bony thorax is intact.  No pleural effusion or pneumothorax is noted.  IMPRESSION: No acute cardiopulmonary abnormality seen.   Original Report Authenticated By: Lupita Raider.,  M.D.    Dg Chest 2 View  11/04/2012  *RADIOLOGY REPORT*  Clinical Data: Weakness and hypoglycemia.  Ex-smoker.  Diabetes.  CHEST - 2 VIEW  Comparison: 10/21/2012  Findings: Mild hyperinflation. Lateral view degraded by patient arm position.  Prior median sternotomy. Midline trachea.  Normal heart size without the atherosclerosis in the transverse aorta.  Tortuous descending thoracic aorta. No pleural effusion or pneumothorax. Clear lungs.  No congestive failure.  IMPRESSION: Hyperinflation, without acute disease.   Original Report Authenticated By: Jeronimo Greaves, M.D.    Dg Chest Port 1 View  10/21/2012  *RADIOLOGY REPORT*  Clinical Data: Chest pain  PORTABLE CHEST - 1 VIEW  Comparison: 10/16/2012  Findings: Normal heart size.  Clear lungs.  No pneumothorax.  No acute bony deformity.  No pleural effusion.  Postoperative changes.  IMPRESSION: No active cardiopulmonary disease.   Original Report Authenticated By: Jolaine Click, M.D.

## 2012-11-18 NOTE — Patient Instructions (Addendum)
Your physician recommends that you schedule a follow-up appointment in: 3-4 WEEKS ,BP CHECK WITH NURSE IN ONE WEEK  Your physician has recommended you make the following change in your medication:   1) YOU CAN NOT STOP TAKING BRILLINTA FOR ANY REASON  2) STOP TAKING POTASSIUM 3) STOP TAKING IMDUR 4) STOP TAKING ZOLOFT 5) STOP TAKING BUPROPION  6) INCREASE COREG TO 12.5MG  ONE TABLET TWICE DAILY  BRING YOUR MEDICATION BOTTLES IN FOR REVIEW ASAP

## 2012-11-19 ENCOUNTER — Encounter (HOSPITAL_COMMUNITY): Payer: Self-pay

## 2012-11-19 ENCOUNTER — Other Ambulatory Visit: Payer: Self-pay

## 2012-11-19 ENCOUNTER — Emergency Department (HOSPITAL_COMMUNITY)
Admission: EM | Admit: 2012-11-19 | Discharge: 2012-11-19 | Disposition: A | Discharge status: Home / Self Care | Payer: Medicare Other | Attending: Emergency Medicine | Admitting: Emergency Medicine

## 2012-11-19 DIAGNOSIS — Z87891 Personal history of nicotine dependence: Secondary | ICD-10-CM | POA: Insufficient documentation

## 2012-11-19 DIAGNOSIS — I10 Essential (primary) hypertension: Secondary | ICD-10-CM | POA: Insufficient documentation

## 2012-11-19 DIAGNOSIS — E119 Type 2 diabetes mellitus without complications: Secondary | ICD-10-CM | POA: Insufficient documentation

## 2012-11-19 DIAGNOSIS — Z8711 Personal history of peptic ulcer disease: Secondary | ICD-10-CM | POA: Insufficient documentation

## 2012-11-19 DIAGNOSIS — Z8701 Personal history of pneumonia (recurrent): Secondary | ICD-10-CM | POA: Insufficient documentation

## 2012-11-19 DIAGNOSIS — R0602 Shortness of breath: Secondary | ICD-10-CM | POA: Insufficient documentation

## 2012-11-19 DIAGNOSIS — Z8781 Personal history of (healed) traumatic fracture: Secondary | ICD-10-CM | POA: Insufficient documentation

## 2012-11-19 DIAGNOSIS — Z8673 Personal history of transient ischemic attack (TIA), and cerebral infarction without residual deficits: Secondary | ICD-10-CM | POA: Insufficient documentation

## 2012-11-19 DIAGNOSIS — G8929 Other chronic pain: Secondary | ICD-10-CM | POA: Insufficient documentation

## 2012-11-19 DIAGNOSIS — N390 Urinary tract infection, site not specified: Secondary | ICD-10-CM | POA: Insufficient documentation

## 2012-11-19 DIAGNOSIS — Z9861 Coronary angioplasty status: Secondary | ICD-10-CM | POA: Insufficient documentation

## 2012-11-19 DIAGNOSIS — Z7982 Long term (current) use of aspirin: Secondary | ICD-10-CM | POA: Insufficient documentation

## 2012-11-19 DIAGNOSIS — Z79899 Other long term (current) drug therapy: Secondary | ICD-10-CM | POA: Insufficient documentation

## 2012-11-19 DIAGNOSIS — I2510 Atherosclerotic heart disease of native coronary artery without angina pectoris: Secondary | ICD-10-CM | POA: Insufficient documentation

## 2012-11-19 DIAGNOSIS — I252 Old myocardial infarction: Secondary | ICD-10-CM | POA: Insufficient documentation

## 2012-11-19 DIAGNOSIS — I959 Hypotension, unspecified: Secondary | ICD-10-CM | POA: Insufficient documentation

## 2012-11-19 DIAGNOSIS — Z862 Personal history of diseases of the blood and blood-forming organs and certain disorders involving the immune mechanism: Secondary | ICD-10-CM | POA: Insufficient documentation

## 2012-11-19 DIAGNOSIS — E785 Hyperlipidemia, unspecified: Secondary | ICD-10-CM | POA: Insufficient documentation

## 2012-11-19 DIAGNOSIS — Z8679 Personal history of other diseases of the circulatory system: Secondary | ICD-10-CM | POA: Insufficient documentation

## 2012-11-19 DIAGNOSIS — M1990 Unspecified osteoarthritis, unspecified site: Secondary | ICD-10-CM | POA: Insufficient documentation

## 2012-11-19 DIAGNOSIS — K219 Gastro-esophageal reflux disease without esophagitis: Secondary | ICD-10-CM | POA: Insufficient documentation

## 2012-11-19 DIAGNOSIS — M549 Dorsalgia, unspecified: Secondary | ICD-10-CM | POA: Insufficient documentation

## 2012-11-19 LAB — CBC WITH DIFFERENTIAL/PLATELET
Eosinophils Absolute: 0.3 10*3/uL (ref 0.0–0.7)
Eosinophils Relative: 4 % (ref 0–5)
HCT: 33.4 % — ABNORMAL LOW (ref 39.0–52.0)
Lymphocytes Relative: 11 % — ABNORMAL LOW (ref 12–46)
Lymphs Abs: 0.9 10*3/uL (ref 0.7–4.0)
MCH: 28.3 pg (ref 26.0–34.0)
MCV: 84.3 fL (ref 78.0–100.0)
Monocytes Absolute: 1 10*3/uL (ref 0.1–1.0)
Monocytes Relative: 12 % (ref 3–12)
RBC: 3.96 MIL/uL — ABNORMAL LOW (ref 4.22–5.81)
WBC: 8.3 10*3/uL (ref 4.0–10.5)

## 2012-11-19 LAB — BASIC METABOLIC PANEL
BUN: 17 mg/dL (ref 6–23)
CO2: 21 mEq/L (ref 19–32)
Calcium: 9.9 mg/dL (ref 8.4–10.5)
Creatinine, Ser: 0.91 mg/dL (ref 0.50–1.35)
Glucose, Bld: 180 mg/dL — ABNORMAL HIGH (ref 70–99)

## 2012-11-19 LAB — GLUCOSE, CAPILLARY: Glucose-Capillary: 174 mg/dL — ABNORMAL HIGH (ref 70–99)

## 2012-11-19 LAB — URINALYSIS, ROUTINE W REFLEX MICROSCOPIC
Bilirubin Urine: NEGATIVE
Glucose, UA: NEGATIVE mg/dL
pH: 6.5 (ref 5.0–8.0)

## 2012-11-19 MED ORDER — CIPROFLOXACIN HCL 250 MG PO TABS
500.0000 mg | ORAL_TABLET | Freq: Once | ORAL | Status: AC
  Administered 2012-11-19: 500 mg via ORAL
  Filled 2012-11-19: qty 2

## 2012-11-19 MED ORDER — CIPROFLOXACIN HCL 500 MG PO TABS: 500.0000 mg | ORAL_TABLET | Freq: Two times a day (BID) | ORAL | Status: DC

## 2012-11-19 NOTE — ED Provider Notes (Addendum)
History     This chart was scribed for [name], MD by Jiles Prows, ED Scribe. The patient was seen in room APA06/APA06 and the patient's care was started at 8:55 AM.   CSN: [id number]  Arrival date & time 11/19/12  0830   Chief Complaint  Patient presents with  . Weakness  . Hypotension     The history is provided by the patient and medical records. No language interpreter was used.  HPI Comments: Jeffrey [name] is a 79 y.o. male with a h/o MI who presents to the Emergency Department complaining of generalized weakness and mild shortness of breath that began today.  Pt has recently been off of his HT medication, but Dr. Dietrich Pates re-started pt on medication recently.  Pt took carcedilol for the first time this morning after bp this morning was 135/65.  Pt went to [facility] for an appointment, and BP was measured low with a systolic pressure under 100 mmHg.  Pt was referred to ED for further evaluation.  Pt denies fever, chills, nausea, vomiting, diarrhea, weakness, cough, SOB and any other pain.   Pt has a history of heart stints with the most recent placed 1 month ago.   Past Medical History  Diagnosis Date  . Diabetes mellitus, type II   . Hyperlipidemia     Lipid profile in 02/2012:135, 227, 41, 49  . Arteriosclerotic cardiovascular disease (ASCVD)     a. CABG x 4 in 1989 (VG->OM1->OM2, VG->RCA, LIMA->LAD), b. 05/2010: DES to VG-OM1/OM2, DES to distal LCx. c. NSTEMI in 04/2011 - TO distal LCX stent and VG->OM2. d. 02/2012 NSTEMI DES to VG-OM1/continuation to OM2 occluded. e. inferior STEMI s/p DES to SVG-RAMUS 06/2012. f. inferolat STEMI 09/2012 s/p DES to SVG-interm.  . Hypertension   . Osteoarthritis   . CVA (cerebral infarction)     details unclear. pt reports light stroke last year involving L leg. No imaging to suggest hemorrhagic etiology  . Pneumonia   . Cervical vertebral fracture   . Chronic back pain   . Benign prostatic hypertrophy     Required urinary catheter x4 weeks  for retention  . Peptic ulcer disease   . Gastroesophageal reflux disease   . Ischemic cardiomyopathy     EF 45-50% 3/32014  . DM (diabetes mellitus)   . Anemia     Past Surgical History  Procedure Laterality Date  . Coronary angioplasty with stent placement      2011, 02/2012  . Tonsillectomy    . Coronary artery bypass graft      Family History  Problem Relation Age of Onset  . Early death      Parents died young  . Appendicitis Mother     Pt was 39 year old  . Heart attack Father 105    History  Substance Use Topics  . Smoking status: Former Smoker -- 2.00 packs/day for 10 years    Types: Cigarettes    Quit date: 07/14/1961  . Smokeless tobacco: Current User    Types: Chew  . Alcohol Use: No      Review of Systems 10 Systems reviewed and all are negative for acute change except as noted in the HPI.   Allergies  Review of patient's allergies indicates no known allergies.  Home Medications   Current Outpatient Rx  Name  Route  Sig  Dispense  Refill  . albuterol (PROVENTIL HFA;VENTOLIN HFA) 108 (90 BASE) MCG/ACT inhaler   Inhalation   Inhale 2 puffs into  the lungs every 6 (six) hours as needed. For shortness of breath         . aspirin 325 MG tablet   Oral   Take 325 mg by mouth daily.         Marland Kitchen atorvastatin (LIPITOR) 80 MG tablet   Oral   Take 1 tablet (80 mg total) by mouth daily at 6 PM.   30 tablet   3   . B Complex-Biotin-FA (B-COMPLEX PO)   Oral   Take 1 tablet by mouth daily.          . calcium carbonate (TUMS - DOSED IN MG ELEMENTAL CALCIUM) 500 MG chewable tablet   Oral   Chew 1 tablet by mouth 4 (four) times daily - after meals and at bedtime.         . carvedilol (COREG) 12.5 MG tablet   Oral   Take 1 tablet (12.5 mg total) by mouth 2 (two) times daily.   180 tablet   3   . COD LIVER OIL PO   Oral   Take 1 tablet by mouth daily.         Tery Sanfilippo Sodium (STOOL SOFTENER) 100 MG capsule   Oral   Take 100 mg by mouth  at bedtime. Constipation         . fish oil-omega-3 fatty acids 1000 MG capsule   Oral   Take 1 g by mouth daily.         Marland Kitchen linagliptin (TRADJENTA) 5 MG TABS tablet   Oral   Take 5 mg by mouth daily.         . metFORMIN (GLUCOPHAGE) 500 MG tablet   Oral   Take 1 tablet (500 mg total) by mouth 2 (two) times daily. HOLD 48 hours, restart on 11/13/2012. MAY TAKE 1 1/2 TAB IF NEEDED, PT ADJUSTS THIS MED HIMSELF         . Misc Natural Products (ALLERGY RELEAF SYSTEM PO)   Oral   Take 1 tablet by mouth as needed.         . Multiple Vitamin (MULTIVITAMIN WITH MINERALS) TABS   Oral   Take 1 tablet by mouth daily.         . nitrofurantoin (MACRODANTIN) 100 MG capsule   Oral   Take 100 mg by mouth at bedtime.         . nitroGLYCERIN (NITROSTAT) 0.4 MG SL tablet   Sublingual   Place 1 tablet (0.4 mg total) under the tongue every 5 (five) minutes x 3 doses as needed for chest pain.   25 tablet   3   . omeprazole (PRILOSEC) 40 MG capsule   Oral   Take 40 mg by mouth daily.         . Ticagrelor (BRILINTA) 90 MG TABS tablet   Oral   Take 1 tablet (90 mg total) by mouth 2 (two) times daily.   60 tablet   6     BP 109/65  Pulse 80  Temp(Src) 97 F (36.1 C) (Oral)  Resp 20  Ht 5\' 10"  (1.778 m)  Wt 170 lb (77.111 kg)  BMI 24.39 kg/m2  SpO2 100%  Physical Exam  Nursing note and vitals reviewed. Constitutional: He is oriented to person, place, and time. He appears well-developed and well-nourished.  HENT:  Head: Normocephalic and atraumatic.  Eyes: Conjunctivae and EOM are normal. Pupils are equal, round, and reactive to light.  Neck: Normal range of motion. Neck supple.  Cardiovascular: Normal rate, regular rhythm and normal heart sounds.   Pulmonary/Chest: Effort normal and breath sounds normal.  Abdominal: Soft. Bowel sounds are normal.  Musculoskeletal: Normal range of motion.  Neurological: He is alert and oriented to person, place, and time.  Skin:  Skin is warm and dry.  Psychiatric: He has a normal mood and affect.    ED Course  Procedures (including critical care time) DIAGNOSTIC STUDIES: Oxygen Saturation is 100% on RA, normal by my interpretation.    COORDINATION OF CARE: 8:52 AM Discussed ED treatment with pt including ceasing carvedilol and pt agrees.   Results for orders placed during the hospital encounter of 11/19/12  GLUCOSE, CAPILLARY      Result Value Range   Glucose-Capillary 174 (*) 70 - 99 mg/dL  CBC WITH DIFFERENTIAL      Result Value Range   WBC 8.3  4.0 - 10.5 K/uL   RBC 3.96 (*) 4.22 - 5.81 MIL/uL   Hemoglobin 11.2 (*) 13.0 - 17.0 g/dL   HCT 16.1 (*) 09.6 - 04.5 %   MCV 84.3  78.0 - 100.0 fL   MCH 28.3  26.0 - 34.0 pg   MCHC 33.5  30.0 - 36.0 g/dL   RDW 40.9  81.1 - 91.4 %   Platelets 217  150 - 400 K/uL   Neutrophils Relative 73  43 - 77 %   Neutro Abs 6.1  1.7 - 7.7 K/uL   Lymphocytes Relative 11 (*) 12 - 46 %   Lymphs Abs 0.9  0.7 - 4.0 K/uL   Monocytes Relative 12  3 - 12 %   Monocytes Absolute 1.0  0.1 - 1.0 K/uL   Eosinophils Relative 4  0 - 5 %   Eosinophils Absolute 0.3  0.0 - 0.7 K/uL   Basophils Relative 0  0 - 1 %   Basophils Absolute 0.0  0.0 - 0.1 K/uL  BASIC METABOLIC PANEL      Result Value Range   Sodium 133 (*) 135 - 145 mEq/L   Potassium 4.6  3.5 - 5.1 mEq/L   Chloride 97  96 - 112 mEq/L   CO2 21  19 - 32 mEq/L   Glucose, Bld 180 (*) 70 - 99 mg/dL   BUN 17  6 - 23 mg/dL   Creatinine, Ser 7.82  0.50 - 1.35 mg/dL   Calcium 9.9  8.4 - 95.6 mg/dL   GFR calc non Af Amer 77 (*) >90 mL/min   GFR calc Af Amer 90 (*) >90 mL/min  TROPONIN I      Result Value Range   Troponin I <0.30  <0.30 ng/mL  URINALYSIS, ROUTINE W REFLEX MICROSCOPIC      Result Value Range   Color, Urine YELLOW  YELLOW   APPearance CLEAR  CLEAR   Specific Gravity, Urine 1.015  1.005 - 1.030   pH 6.5  5.0 - 8.0   Glucose, UA NEGATIVE  NEGATIVE mg/dL   Hgb urine dipstick TRACE (*) NEGATIVE   Bilirubin  Urine NEGATIVE  NEGATIVE   Ketones, ur TRACE (*) NEGATIVE mg/dL   Protein, ur NEGATIVE  NEGATIVE mg/dL   Urobilinogen, UA 0.2  0.0 - 1.0 mg/dL   Nitrite NEGATIVE  NEGATIVE   Leukocytes, UA LARGE (*) NEGATIVE  URINE MICROSCOPIC-ADD ON      Result Value Range   WBC, UA TOO NUMEROUS TO COUNT  <3 WBC/hpf   RBC / HPF 3-6  <3 RBC/hpf   Bacteria, UA MANY (*) RARE  No results found.   No diagnosis found.   Date: 11/19/2012  Rate: 72  Rhythm: normal sinus rhythm  QRS Axis: normal  Intervals: normal  ST/T Wave abnormalities: normal  Conduction Disutrbances: none  Narrative Interpretation: unremarkable     MDM  Patient looks his normal self.  Urinalysis reveals too numerous to count white cells. Rx Cipro 500 mg bid  for one week.   Will discontinue his new blood pressure medication. Patient is nontoxic.    I personally performed the services described in this documentation, which was scribed in my presence. The recorded information has been reviewed and is accurate.      [name], MD 11/19/12 1524  [name], MD 11/19/12 [phone]

## 2012-11-19 NOTE — ED Notes (Signed)
Patient states he feels uncomfortable going home. Dr Adriana Simas made aware of orthostatic vital signs again. Per Dr Adriana Simas patient not symptomatic and can be discharged but is to return if weakness continues after stopping B/P medication and taking antibiotic.

## 2012-11-19 NOTE — ED Notes (Signed)
Feeling weak, blood pressure low. Patient states that he thinks he took a high blood pressure pill that he was not supposed to take.

## 2012-11-19 NOTE — ED Notes (Signed)
Dr Adriana Simas made aware of patient's orthostatic vital signs.

## 2012-11-22 ENCOUNTER — Encounter (HOSPITAL_COMMUNITY): Payer: Self-pay

## 2012-11-22 ENCOUNTER — Encounter: Payer: Self-pay | Admitting: Cardiology

## 2012-11-22 ENCOUNTER — Emergency Department (HOSPITAL_COMMUNITY)
Admission: EM | Admit: 2012-11-22 | Discharge: 2012-11-22 | Disposition: A | Discharge status: Home / Self Care | Payer: Medicare Other | Attending: Emergency Medicine | Admitting: Emergency Medicine

## 2012-11-22 ENCOUNTER — Telehealth: Payer: Self-pay | Admitting: *Deleted

## 2012-11-22 DIAGNOSIS — N390 Urinary tract infection, site not specified: Secondary | ICD-10-CM | POA: Insufficient documentation

## 2012-11-22 DIAGNOSIS — Z9119 Patient's noncompliance with other medical treatment and regimen: Secondary | ICD-10-CM | POA: Insufficient documentation

## 2012-11-22 DIAGNOSIS — I10 Essential (primary) hypertension: Secondary | ICD-10-CM | POA: Insufficient documentation

## 2012-11-22 DIAGNOSIS — Z7982 Long term (current) use of aspirin: Secondary | ICD-10-CM | POA: Insufficient documentation

## 2012-11-22 DIAGNOSIS — G8929 Other chronic pain: Secondary | ICD-10-CM | POA: Insufficient documentation

## 2012-11-22 DIAGNOSIS — Z9861 Coronary angioplasty status: Secondary | ICD-10-CM | POA: Insufficient documentation

## 2012-11-22 DIAGNOSIS — Z87448 Personal history of other diseases of urinary system: Secondary | ICD-10-CM | POA: Insufficient documentation

## 2012-11-22 DIAGNOSIS — R630 Anorexia: Secondary | ICD-10-CM | POA: Insufficient documentation

## 2012-11-22 DIAGNOSIS — M549 Dorsalgia, unspecified: Secondary | ICD-10-CM | POA: Insufficient documentation

## 2012-11-22 DIAGNOSIS — D649 Anemia, unspecified: Secondary | ICD-10-CM | POA: Insufficient documentation

## 2012-11-22 DIAGNOSIS — Z79899 Other long term (current) drug therapy: Secondary | ICD-10-CM | POA: Insufficient documentation

## 2012-11-22 DIAGNOSIS — Z860100 Personal history of colon polyps, unspecified: Secondary | ICD-10-CM | POA: Insufficient documentation

## 2012-11-22 DIAGNOSIS — Z8673 Personal history of transient ischemic attack (TIA), and cerebral infarction without residual deficits: Secondary | ICD-10-CM | POA: Insufficient documentation

## 2012-11-22 DIAGNOSIS — Z8639 Personal history of other endocrine, nutritional and metabolic disease: Secondary | ICD-10-CM | POA: Insufficient documentation

## 2012-11-22 DIAGNOSIS — Z951 Presence of aortocoronary bypass graft: Secondary | ICD-10-CM | POA: Insufficient documentation

## 2012-11-22 DIAGNOSIS — R110 Nausea: Secondary | ICD-10-CM | POA: Insufficient documentation

## 2012-11-22 DIAGNOSIS — Z862 Personal history of diseases of the blood and blood-forming organs and certain disorders involving the immune mechanism: Secondary | ICD-10-CM | POA: Insufficient documentation

## 2012-11-22 DIAGNOSIS — Z87891 Personal history of nicotine dependence: Secondary | ICD-10-CM | POA: Insufficient documentation

## 2012-11-22 DIAGNOSIS — Z8679 Personal history of other diseases of the circulatory system: Secondary | ICD-10-CM | POA: Insufficient documentation

## 2012-11-22 DIAGNOSIS — Z8701 Personal history of pneumonia (recurrent): Secondary | ICD-10-CM | POA: Insufficient documentation

## 2012-11-22 DIAGNOSIS — Z8781 Personal history of (healed) traumatic fracture: Secondary | ICD-10-CM | POA: Insufficient documentation

## 2012-11-22 DIAGNOSIS — E119 Type 2 diabetes mellitus without complications: Secondary | ICD-10-CM | POA: Insufficient documentation

## 2012-11-22 DIAGNOSIS — E860 Dehydration: Secondary | ICD-10-CM | POA: Insufficient documentation

## 2012-11-22 DIAGNOSIS — M1990 Unspecified osteoarthritis, unspecified site: Secondary | ICD-10-CM | POA: Insufficient documentation

## 2012-11-22 DIAGNOSIS — K219 Gastro-esophageal reflux disease without esophagitis: Secondary | ICD-10-CM | POA: Insufficient documentation

## 2012-11-22 LAB — CBC WITH DIFFERENTIAL/PLATELET
Basophils Absolute: 0 10*3/uL (ref 0.0–0.1)
Eosinophils Relative: 3 % (ref 0–5)
HCT: 34.2 % — ABNORMAL LOW (ref 39.0–52.0)
Lymphocytes Relative: 17 % (ref 12–46)
Lymphs Abs: 1.5 10*3/uL (ref 0.7–4.0)
MCV: 84 fL (ref 78.0–100.0)
Monocytes Absolute: 0.8 10*3/uL (ref 0.1–1.0)
RDW: 14.5 % (ref 11.5–15.5)
WBC: 8.6 10*3/uL (ref 4.0–10.5)

## 2012-11-22 LAB — URINE CULTURE: Colony Count: >=100000

## 2012-11-22 LAB — BASIC METABOLIC PANEL
CO2: 22 mEq/L (ref 19–32)
Calcium: 9.3 mg/dL (ref 8.4–10.5)
Creatinine, Ser: 0.95 mg/dL (ref 0.50–1.35)
Glucose, Bld: 168 mg/dL — ABNORMAL HIGH (ref 70–99)

## 2012-11-22 MED ORDER — VANCOMYCIN HCL IN DEXTROSE 1-5 GM/200ML-% IV SOLN
1000.0000 mg | Freq: Once | INTRAVENOUS | Status: AC
  Administered 2012-11-22: 1000 mg via INTRAVENOUS
  Filled 2012-11-22: qty 200

## 2012-11-22 MED ORDER — SODIUM CHLORIDE 0.9 % IV SOLN: 1000.0000 mL | INTRAVENOUS | Status: DC

## 2012-11-22 MED ORDER — SODIUM CHLORIDE 0.9 % IV SOLN
1000.0000 mL | Freq: Once | INTRAVENOUS | Status: AC
  Administered 2012-11-22: 1000 mL via INTRAVENOUS

## 2012-11-22 MED ORDER — NITROFURANTOIN MONOHYD MACRO 100 MG PO CAPS: 100.0000 mg | ORAL_CAPSULE | Freq: Two times a day (BID) | ORAL | Status: DC

## 2012-11-22 MED ORDER — NITROFURANTOIN MONOHYD MACRO 100 MG PO CAPS
100.0000 mg | ORAL_CAPSULE | Freq: Once | ORAL | Status: AC
  Administered 2012-11-22: 100 mg via ORAL
  Filled 2012-11-22: qty 1

## 2012-11-22 MED ORDER — SODIUM CHLORIDE 0.9 % IV SOLN
Freq: Once | INTRAVENOUS | Status: AC
  Administered 2012-11-22: 17:00:00 via INTRAVENOUS

## 2012-11-22 NOTE — Telephone Encounter (Signed)
Received call from Sharen Hint from [facility] has advised pt is receiving care from [facility] as well but pt is now refusing support from social work and now [facility] will not be able to go out assist this pt and wanted to inform Dr Dietrich Pates, please also note pt cancelled nurse visit to check medications on 11-19-12 and re-scheduled for 11-25-12, pt noted in ED on 11-19-12 per UTI as well

## 2012-11-22 NOTE — ED Notes (Signed)
Pt reports c/o weakness and dizziness with standing since Friday.  Reports has orthostatic hypotension.  Pt says was evaluated here Friday and returned today because doesn't feel any better.

## 2012-11-22 NOTE — Telephone Encounter (Signed)
I will address at patient's next office visit.  [place], MD

## 2012-11-22 NOTE — ED Provider Notes (Signed)
History     CSN: [id number]  Arrival date & time 11/22/12  1647   First MD Initiated Contact with Patient 11/22/12 1709      Chief Complaint  Patient presents with  . Weakness     The history is provided by the patient.   patient reports developing generalized weakness and dizziness when he stands for the past several days.  He denies true syncope.  He reports near syncope.  No chest pain or shortness of breath.  No palpitations.  The patient was seen emergency room several days ago and was started on antibiotics for suspected urinary tract infection.  No fevers or chills.  No vomiting.  No diarrhea.  No abdominal pain.  No flank pain.  Symptoms are mild to moderate in severity.  He reports decreased oral intake secondary to mild nausea and anorexia.  Symptoms are mild in severity.  Nothing worsens or improves his symptoms  Past Medical History  Diagnosis Date  . Diabetes mellitus, type II   . Hyperlipidemia     Lipid profile in 02/2012:135, 227, 41, 49  . Arteriosclerotic cardiovascular disease (ASCVD)     a. CABG x 4 in 1989 (VG->OM1->OM2, VG->RCA, LIMA->LAD), b. 05/2010: DES to VG-OM1/OM2, DES to distal LCx. c. NSTEMI in 04/2011 - TO distal LCX stent and VG->OM2. d. 02/2012 NSTEMI DES to VG-OM1/continuation to OM2 occluded. e. inferior STEMI s/p DES to SVG-RAMUS 06/2012. f. inferolat STEMI 09/2012 s/p DES to SVG-interm.  . Hypertension   . Osteoarthritis   . CVA (cerebral infarction)     details unclear. pt reports light stroke last year involving L leg. No imaging to suggest hemorrhagic etiology  . Pneumonia   . Cervical vertebral fracture   . Chronic back pain   . Benign prostatic hypertrophy     Required urinary catheter x4 weeks for retention  . Peptic ulcer disease   . Gastroesophageal reflux disease   . Ischemic cardiomyopathy     EF 45-50% 3/32014  . DM (diabetes mellitus)   . Anemia     Past Surgical History  Procedure Laterality Date  . Coronary angioplasty with  stent placement      2011, 02/2012  . Tonsillectomy    . Coronary artery bypass graft      Family History  Problem Relation Age of Onset  . Early death      Parents died young  . Appendicitis Mother     Pt was 79 year old  . Heart attack Father 94    History  Substance Use Topics  . Smoking status: Former Smoker -- 2.00 packs/day for 10 years    Types: Cigarettes    Quit date: 07/14/1961  . Smokeless tobacco: Current User    Types: Chew  . Alcohol Use: No      Review of Systems  All other systems reviewed and are negative.    Allergies  Review of patient's allergies indicates no known allergies.  Home Medications   Current Outpatient Rx  Name  Route  Sig  Dispense  Refill  . albuterol (PROVENTIL HFA;VENTOLIN HFA) 108 (90 BASE) MCG/ACT inhaler   Inhalation   Inhale 2 puffs into the lungs every 6 (six) hours as needed. For shortness of breath         . aspirin 325 MG tablet   Oral   Take 325 mg by mouth daily.         . B Complex-Biotin-FA (B-COMPLEX PO)   Oral   Take  1 tablet by mouth daily.          Marland Kitchen buPROPion (WELLBUTRIN) 100 MG tablet   Oral   Take 100 mg by mouth 3 (three) times daily.         . calcium carbonate (TUMS - DOSED IN MG ELEMENTAL CALCIUM) 500 MG chewable tablet   Oral   Chew 1 tablet by mouth 4 (four) times daily - after meals and at bedtime.         . ciprofloxacin (CIPRO) 500 MG tablet   Oral   Take 1 tablet (500 mg total) by mouth 2 (two) times daily. One po bid x 7 days   14 tablet   0   . COD LIVER OIL PO   Oral   Take 1 tablet by mouth daily.         Tery Sanfilippo Sodium (STOOL SOFTENER) 100 MG capsule   Oral   Take 100 mg by mouth at bedtime. Constipation         . fish oil-omega-3 fatty acids 1000 MG capsule   Oral   Take 1 g by mouth daily.         Marland Kitchen linagliptin (TRADJENTA) 5 MG TABS tablet   Oral   Take 5 mg by mouth daily.         Marland Kitchen loratadine (CLARITIN) 10 MG tablet   Oral   Take 10 mg by  mouth daily.         . metFORMIN (GLUCOPHAGE) 500 MG tablet   Oral   Take 1 tablet (500 mg total) by mouth 2 (two) times daily. HOLD 48 hours, restart on 11/13/2012. MAY TAKE 1 1/2 TAB IF NEEDED, PT ADJUSTS THIS MED HIMSELF         . Multiple Vitamin (MULTIVITAMIN WITH MINERALS) TABS   Oral   Take 1 tablet by mouth daily.         . nitrofurantoin (MACRODANTIN) 100 MG capsule   Oral   Take 100 mg by mouth at bedtime.         . nitroGLYCERIN (NITROSTAT) 0.4 MG SL tablet   Sublingual   Place 1 tablet (0.4 mg total) under the tongue every 5 (five) minutes x 3 doses as needed for chest pain.   25 tablet   3   . omeprazole (PRILOSEC) 40 MG capsule   Oral   Take 40 mg by mouth daily.         . Ticagrelor (BRILINTA) 90 MG TABS tablet   Oral   Take 1 tablet (90 mg total) by mouth 2 (two) times daily.   60 tablet   6     BP 144/69  Pulse 89  Physical Exam  Nursing note and vitals reviewed. Constitutional: He is oriented to person, place, and time. He appears well-developed and well-nourished.  HENT:  Head: Normocephalic and atraumatic.  Eyes: EOM are normal.  Neck: Normal range of motion.  Cardiovascular: Normal rate, regular rhythm, normal heart sounds and intact distal pulses.   Pulmonary/Chest: Effort normal and breath sounds normal. No respiratory distress.  Abdominal: Soft. He exhibits no distension. There is no tenderness.  Genitourinary: Rectum normal.  Musculoskeletal: Normal range of motion.  Neurological: He is alert and oriented to person, place, and time.  Skin: Skin is warm and dry.  Psychiatric: He has a normal mood and affect. Judgment normal.    ED Course  Procedures (including critical care time)   Date: 11/22/2012  Rate: 77  Rhythm: normal  sinus rhythm  QRS Axis: normal  Intervals: normal  ST/T Wave abnormalities: normal  Conduction Disutrbances: none  Narrative Interpretation:   Old EKG Reviewed: No significant changes  noted     Labs Reviewed  CBC WITH DIFFERENTIAL - Abnormal; Notable for the following:    RBC 4.07 (*)    Hemoglobin 11.4 (*)    HCT 34.2 (*)    All other components within normal limits  BASIC METABOLIC PANEL - Abnormal; Notable for the following:    Sodium 132 (*)    Glucose, Bld 168 (*)    GFR calc non Af Amer 76 (*)    GFR calc Af Amer 88 (*)    All other components within normal limits  LACTIC ACID, PLASMA - Abnormal; Notable for the following:    Lactic Acid, Venous 2.5 (*)    All other components within normal limits  TROPONIN I   No results found.   1. Urinary tract infection   2. Dehydration       MDM  The patient has generalized weakness.  He does have a urinary tract infection.  His urine culture from his prior ER visit grew out greater than 100,000 enterococcus.  Sensitivities are not back yet.  The patient had been on ciprofloxacin which likely will not cover enterococcus.  I gave the patient a dose of IV vancomycin as well as Zosyn Macrobid.  He'll be discharged home with Macrobid.  He was given fluids in the emergency department and feels much better.  He is no longer lightheaded when he stands up.  His lactate was just mildly elevated 2.5.  The patient looks great and because of this a vas that the patient return the emergency [facility] tomorrow for recheck.  I will personally reevaluate the patient tomorrow to make sure he is feeling better.        Lyanne Co, MD 11/23/12 [phone]

## 2012-11-22 NOTE — ED Notes (Signed)
Meal tray ordered 

## 2012-11-23 ENCOUNTER — Emergency Department (HOSPITAL_COMMUNITY)
Admission: EM | Admit: 2012-11-23 | Discharge: 2012-11-23 | Disposition: A | Discharge status: Home / Self Care | Payer: Medicare Other | Attending: Emergency Medicine | Admitting: Emergency Medicine

## 2012-11-23 ENCOUNTER — Encounter (HOSPITAL_COMMUNITY): Payer: Self-pay | Admitting: Emergency Medicine

## 2012-11-23 DIAGNOSIS — Z7982 Long term (current) use of aspirin: Secondary | ICD-10-CM | POA: Insufficient documentation

## 2012-11-23 DIAGNOSIS — Z09 Encounter for follow-up examination after completed treatment for conditions other than malignant neoplasm: Secondary | ICD-10-CM | POA: Insufficient documentation

## 2012-11-23 DIAGNOSIS — Z8639 Personal history of other endocrine, nutritional and metabolic disease: Secondary | ICD-10-CM | POA: Insufficient documentation

## 2012-11-23 DIAGNOSIS — Z862 Personal history of diseases of the blood and blood-forming organs and certain disorders involving the immune mechanism: Secondary | ICD-10-CM | POA: Insufficient documentation

## 2012-11-23 DIAGNOSIS — Z7902 Long term (current) use of antithrombotics/antiplatelets: Secondary | ICD-10-CM | POA: Insufficient documentation

## 2012-11-23 DIAGNOSIS — Z8711 Personal history of peptic ulcer disease: Secondary | ICD-10-CM | POA: Insufficient documentation

## 2012-11-23 DIAGNOSIS — Z87891 Personal history of nicotine dependence: Secondary | ICD-10-CM | POA: Insufficient documentation

## 2012-11-23 DIAGNOSIS — Z8701 Personal history of pneumonia (recurrent): Secondary | ICD-10-CM | POA: Insufficient documentation

## 2012-11-23 DIAGNOSIS — Z8673 Personal history of transient ischemic attack (TIA), and cerebral infarction without residual deficits: Secondary | ICD-10-CM | POA: Insufficient documentation

## 2012-11-23 DIAGNOSIS — Z951 Presence of aortocoronary bypass graft: Secondary | ICD-10-CM | POA: Insufficient documentation

## 2012-11-23 DIAGNOSIS — Z8781 Personal history of (healed) traumatic fracture: Secondary | ICD-10-CM | POA: Insufficient documentation

## 2012-11-23 DIAGNOSIS — Z79899 Other long term (current) drug therapy: Secondary | ICD-10-CM | POA: Insufficient documentation

## 2012-11-23 DIAGNOSIS — D649 Anemia, unspecified: Secondary | ICD-10-CM | POA: Insufficient documentation

## 2012-11-23 DIAGNOSIS — G8929 Other chronic pain: Secondary | ICD-10-CM | POA: Insufficient documentation

## 2012-11-23 DIAGNOSIS — I10 Essential (primary) hypertension: Secondary | ICD-10-CM | POA: Insufficient documentation

## 2012-11-23 DIAGNOSIS — M1990 Unspecified osteoarthritis, unspecified site: Secondary | ICD-10-CM | POA: Insufficient documentation

## 2012-11-23 DIAGNOSIS — Z9861 Coronary angioplasty status: Secondary | ICD-10-CM | POA: Insufficient documentation

## 2012-11-23 DIAGNOSIS — Z8679 Personal history of other diseases of the circulatory system: Secondary | ICD-10-CM | POA: Insufficient documentation

## 2012-11-23 DIAGNOSIS — E119 Type 2 diabetes mellitus without complications: Secondary | ICD-10-CM | POA: Insufficient documentation

## 2012-11-23 DIAGNOSIS — M549 Dorsalgia, unspecified: Secondary | ICD-10-CM | POA: Insufficient documentation

## 2012-11-23 DIAGNOSIS — Z87448 Personal history of other diseases of urinary system: Secondary | ICD-10-CM | POA: Insufficient documentation

## 2012-11-23 DIAGNOSIS — N390 Urinary tract infection, site not specified: Secondary | ICD-10-CM | POA: Insufficient documentation

## 2012-11-23 DIAGNOSIS — K219 Gastro-esophageal reflux disease without esophagitis: Secondary | ICD-10-CM | POA: Insufficient documentation

## 2012-11-23 NOTE — ED Notes (Signed)
Post ED Visit - Positive Culture Follow-up  Culture report reviewed by antimicrobial stewardship pharmacist: []  Wes Dulaney, Pharm.D., BCPS [x]  Celedonio Miyamoto, [address].D., BCPS []  Georgina Pillion, Pharm.D., BCPS []  [place], [address].D., BCPS, AAHIVP []  Estella Husk, Pharm.D., BCPS, AAHIV  Positive Urine culture Treated with Cipro organism sensitive to the same and no further patient follow-up is required at this time.  Larena Sox 11/23/2012, 6:52 PM

## 2012-11-23 NOTE — ED Provider Notes (Signed)
History     CSN: [id number]  Arrival date & time 11/23/12  1452   First MD Initiated Contact with Patient 11/23/12 1649      Chief Complaint  Patient presents with  . Follow-up  . Fatigue   The history is provided by the patient.   the patient returns emergency department today for repeat evaluation.  He was seen yesterday by myself for a urinary tract infection and some orthostatic hypotension.  The patient reports his been doing well at home.  His had increased oral intake.  He denies lightheadedness when standing up.  No fevers or chills.  No flank pain.  No nausea or vomiting.  He filled his prescription for Macrobid and is tolerating it well.  He denies abdominal pain.  No other complaints at this time.  Past Medical History  Diagnosis Date  . Diabetes mellitus, type II   . Hyperlipidemia     Lipid profile in 02/2012:135, 227, 41, 49  . Arteriosclerotic cardiovascular disease (ASCVD)     a. CABG x 4 in 1989 (VG->OM1->OM2, VG->RCA, LIMA->LAD), b. 05/2010: DES to VG-OM1/OM2, DES to distal LCx. c. NSTEMI in 04/2011 - TO distal LCX stent and VG->OM2. d. 02/2012 NSTEMI DES to VG-OM1/continuation to OM2 occluded. e. inferior STEMI s/p DES to SVG-RAMUS 06/2012. f. inferolat STEMI 09/2012 s/p DES to SVG-interm.  . Hypertension   . Osteoarthritis   . CVA (cerebral infarction)     details unclear. pt reports light stroke last year involving L leg. No imaging to suggest hemorrhagic etiology  . Pneumonia   . Cervical vertebral fracture   . Chronic back pain   . Benign prostatic hypertrophy     Required urinary catheter x4 weeks for retention  . Peptic ulcer disease   . Gastroesophageal reflux disease   . Ischemic cardiomyopathy     EF 45-50% 3/32014  . DM (diabetes mellitus)   . Anemia     Past Surgical History  Procedure Laterality Date  . Coronary angioplasty with stent placement      2011, 02/2012  . Tonsillectomy    . Coronary artery bypass graft      Family History  Problem  Relation Age of Onset  . Early death      Parents died young  . Appendicitis Mother     Pt was 89 year old  . Heart attack Father 79    History  Substance Use Topics  . Smoking status: Former Smoker -- 2.00 packs/day for 10 years    Types: Cigarettes    Quit date: 07/14/1961  . Smokeless tobacco: Current User    Types: Chew  . Alcohol Use: No      Review of Systems  All other systems reviewed and are negative.    Allergies  Review of patient's allergies indicates no known allergies.  Home Medications   Current Outpatient Rx  Name  Route  Sig  Dispense  Refill  . albuterol (PROVENTIL HFA;VENTOLIN HFA) 108 (90 BASE) MCG/ACT inhaler   Inhalation   Inhale 2 puffs into the lungs every 6 (six) hours as needed. For shortness of breath         . aspirin 325 MG tablet   Oral   Take 325 mg by mouth daily.         . B Complex-Biotin-FA (B-COMPLEX PO)   Oral   Take 1 tablet by mouth daily.          Marland Kitchen buPROPion (WELLBUTRIN) 100 MG tablet  Oral   Take 100 mg by mouth 3 (three) times daily.         . calcium carbonate (TUMS - DOSED IN MG ELEMENTAL CALCIUM) 500 MG chewable tablet   Oral   Chew 1 tablet by mouth 4 (four) times daily - after meals and at bedtime.         . COD LIVER OIL PO   Oral   Take 1 tablet by mouth daily.         Tery Sanfilippo Sodium (STOOL SOFTENER) 100 MG capsule   Oral   Take 200 mg by mouth 2 (two) times daily. Constipation         . fish oil-omega-3 fatty acids 1000 MG capsule   Oral   Take 1 g by mouth daily.         Marland Kitchen linagliptin (TRADJENTA) 5 MG TABS tablet   Oral   Take 5 mg by mouth daily.         Marland Kitchen loratadine (CLARITIN) 10 MG tablet   Oral   Take 10 mg by mouth daily.         . metFORMIN (GLUCOPHAGE) 500 MG tablet   Oral   Take 500 mg by mouth 2 (two) times daily. MAY TAKE 1 1/2 TAB IF NEEDED, PT ADJUSTS THIS MED HIMSELF         . Multiple Vitamin (MULTIVITAMIN WITH MINERALS) TABS   Oral   Take 1 tablet  by mouth daily.         . nitrofurantoin, macrocrystal-monohydrate, (MACROBID) 100 MG capsule   Oral   Take 1 capsule (100 mg total) by mouth 2 (two) times daily.   14 capsule   0   . nitroGLYCERIN (NITROSTAT) 0.4 MG SL tablet   Sublingual   Place 1 tablet (0.4 mg total) under the tongue every 5 (five) minutes x 3 doses as needed for chest pain.   25 tablet   3   . omeprazole (PRILOSEC) 40 MG capsule   Oral   Take 40 mg by mouth daily.         . Ticagrelor (BRILINTA) 90 MG TABS tablet   Oral   Take 1 tablet (90 mg total) by mouth 2 (two) times daily.   60 tablet   6   . isosorbide mononitrate (IMDUR) 30 MG 24 hr tablet   Oral   Take 15 mg by mouth daily.           BP 128/69  Pulse 85  Temp(Src) 98.3 F (36.8 C) (Oral)  Resp 20  Ht 5\' 10"  (1.778 m)  Wt 170 lb (77.111 kg)  BMI 24.39 kg/m2  SpO2 99%  Physical Exam  Nursing note and vitals reviewed. Constitutional: He is oriented to person, place, and time. He appears well-developed and well-nourished.  HENT:  Head: Normocephalic and atraumatic.  Eyes: EOM are normal.  Neck: Normal range of motion.  Cardiovascular: Normal rate, regular rhythm, normal heart sounds and intact distal pulses.   Pulmonary/Chest: Effort normal and breath sounds normal. No respiratory distress.  Abdominal: Soft. He exhibits no distension. There is no tenderness.  Genitourinary: Rectum normal.  Musculoskeletal: Normal range of motion.  Neurological: He is alert and oriented to person, place, and time.  Skin: Skin is warm and dry.  Psychiatric: He has a normal mood and affect. Judgment normal.    ED Course  Procedures (including critical care time)  Labs Reviewed  GLUCOSE, CAPILLARY - Abnormal; Notable for the following:  Glucose-Capillary 126 (*)    All other components within normal limits   No results found.   1. Urinary tract infection       MDM  Sensitivities came back on the urine and it appears though it's  resistant to Macrobid.  It is sensitive to Levaquin.  I called in a prescription for Levaquin to his pharmacy [facility].  He'll be given Levaquin 500 mg daily x7 days.  Have asked they follow closely with his primary care physician.  His vital signs are normal.  He looks great and is ambulatory in the emergency department.  Discharge home in good condition.    Lyanne Co, MD 11/23/12 [phone]

## 2012-11-23 NOTE — ED Notes (Signed)
Patient here for follow-up. Per patient was seen here yesterday for orthostatic hypotension, treated by Dr Patria Mane. Per was told to come back for recheck and if he didn't feel better, patient states "and I don't feel well."

## 2012-11-24 ENCOUNTER — Encounter (HOSPITAL_COMMUNITY): Payer: Self-pay | Admitting: *Deleted

## 2012-11-24 ENCOUNTER — Emergency Department (HOSPITAL_COMMUNITY)
Admission: EM | Admit: 2012-11-24 | Discharge: 2012-11-24 | Disposition: A | Discharge status: Home / Self Care | Payer: Medicare Other | Attending: Emergency Medicine | Admitting: Emergency Medicine

## 2012-11-24 DIAGNOSIS — Z87891 Personal history of nicotine dependence: Secondary | ICD-10-CM | POA: Insufficient documentation

## 2012-11-24 DIAGNOSIS — Z79899 Other long term (current) drug therapy: Secondary | ICD-10-CM | POA: Insufficient documentation

## 2012-11-24 DIAGNOSIS — R339 Retention of urine, unspecified: Secondary | ICD-10-CM | POA: Insufficient documentation

## 2012-11-24 DIAGNOSIS — E785 Hyperlipidemia, unspecified: Secondary | ICD-10-CM | POA: Insufficient documentation

## 2012-11-24 DIAGNOSIS — Z8679 Personal history of other diseases of the circulatory system: Secondary | ICD-10-CM | POA: Insufficient documentation

## 2012-11-24 DIAGNOSIS — I2510 Atherosclerotic heart disease of native coronary artery without angina pectoris: Secondary | ICD-10-CM | POA: Insufficient documentation

## 2012-11-24 DIAGNOSIS — Z8719 Personal history of other diseases of the digestive system: Secondary | ICD-10-CM | POA: Insufficient documentation

## 2012-11-24 DIAGNOSIS — Z8739 Personal history of other diseases of the musculoskeletal system and connective tissue: Secondary | ICD-10-CM | POA: Insufficient documentation

## 2012-11-24 DIAGNOSIS — Z8673 Personal history of transient ischemic attack (TIA), and cerebral infarction without residual deficits: Secondary | ICD-10-CM | POA: Insufficient documentation

## 2012-11-24 DIAGNOSIS — K219 Gastro-esophageal reflux disease without esophagitis: Secondary | ICD-10-CM | POA: Insufficient documentation

## 2012-11-24 DIAGNOSIS — N401 Enlarged prostate with lower urinary tract symptoms: Secondary | ICD-10-CM | POA: Insufficient documentation

## 2012-11-24 DIAGNOSIS — Z7982 Long term (current) use of aspirin: Secondary | ICD-10-CM | POA: Insufficient documentation

## 2012-11-24 DIAGNOSIS — I10 Essential (primary) hypertension: Secondary | ICD-10-CM | POA: Insufficient documentation

## 2012-11-24 DIAGNOSIS — G8929 Other chronic pain: Secondary | ICD-10-CM | POA: Insufficient documentation

## 2012-11-24 DIAGNOSIS — N138 Other obstructive and reflux uropathy: Secondary | ICD-10-CM | POA: Insufficient documentation

## 2012-11-24 DIAGNOSIS — D649 Anemia, unspecified: Secondary | ICD-10-CM | POA: Insufficient documentation

## 2012-11-24 DIAGNOSIS — Z951 Presence of aortocoronary bypass graft: Secondary | ICD-10-CM | POA: Insufficient documentation

## 2012-11-24 DIAGNOSIS — E119 Type 2 diabetes mellitus without complications: Secondary | ICD-10-CM | POA: Insufficient documentation

## 2012-11-24 DIAGNOSIS — Z8781 Personal history of (healed) traumatic fracture: Secondary | ICD-10-CM | POA: Insufficient documentation

## 2012-11-24 DIAGNOSIS — N390 Urinary tract infection, site not specified: Secondary | ICD-10-CM | POA: Insufficient documentation

## 2012-11-24 NOTE — Assessment & Plan Note (Addendum)
Antidepressants may be contributing. Zoloft and bupropion will be held and blood pressure reassessed in one week.

## 2012-11-24 NOTE — Assessment & Plan Note (Signed)
No significant elevation in blood pressure in recent months.

## 2012-11-24 NOTE — Progress Notes (Signed)
Patient ID: Jeffrey [name], male   DOB: [birth year], 79 y.o.   MRN: [id number]  HPI: Schedule return visit for this nice gentleman with known coronary artery disease, prior percutaneous interventions and prior CABG surgery. He describes recent chest discomfort that is nonspecific in its characteristics and occurs both at rest and with exercise.  Current Outpatient Prescriptions  Medication Sig Dispense Refill  . albuterol (PROVENTIL HFA;VENTOLIN HFA) 108 (90 BASE) MCG/ACT inhaler Inhale 2 puffs into the lungs every 6 (six) hours as needed. For shortness of breath      . aspirin 325 MG tablet Take 325 mg by mouth daily.      . B Complex-Biotin-FA (B-COMPLEX PO) Take 1 tablet by mouth daily.       . calcium carbonate (TUMS - DOSED IN MG ELEMENTAL CALCIUM) 500 MG chewable tablet Chew 1 tablet by mouth 4 (four) times daily - after meals and at bedtime.      . COD LIVER OIL PO Take 1 tablet by mouth daily.      Tery Sanfilippo Sodium (STOOL SOFTENER) 100 MG capsule Take 200 mg by mouth 2 (two) times daily. Constipation      . fish oil-omega-3 fatty acids 1000 MG capsule Take 1 g by mouth daily.      Marland Kitchen linagliptin (TRADJENTA) 5 MG TABS tablet Take 5 mg by mouth daily.      . Multiple Vitamin (MULTIVITAMIN WITH MINERALS) TABS Take 1 tablet by mouth daily.      . nitroGLYCERIN (NITROSTAT) 0.4 MG SL tablet Place 1 tablet (0.4 mg total) under the tongue every 5 (five) minutes x 3 doses as needed for chest pain.  25 tablet  3  . omeprazole (PRILOSEC) 40 MG capsule Take 40 mg by mouth daily.      . Ticagrelor (BRILINTA) 90 MG TABS tablet Take 1 tablet (90 mg total) by mouth 2 (two) times daily.  60 tablet  6  . buPROPion (WELLBUTRIN) 100 MG tablet Take 100 mg by mouth 3 (three) times daily.      . isosorbide mononitrate (IMDUR) 30 MG 24 hr tablet Take 15 mg by mouth daily.      Marland Kitchen loratadine (CLARITIN) 10 MG tablet Take 10 mg by mouth daily.      . metFORMIN (GLUCOPHAGE) 500 MG tablet Take 500 mg by mouth 2 (two)  times daily. MAY TAKE 1 1/2 TAB IF NEEDED, PT ADJUSTS THIS MED HIMSELF      . nitrofurantoin, macrocrystal-monohydrate, (MACROBID) 100 MG capsule Take 1 capsule (100 mg total) by mouth 2 (two) times daily.  14 capsule  0   No current facility-administered medications for this visit.   No Known Allergies   Past medical history, social history, and family history reviewed and updated.  ROS: Denies orthopnea, PND, palpitations, lightheadedness or syncope. All other systems reviewed and are negative.  PHYSICAL EXAM: BP 124/60  Pulse 95  Ht 5\' 10"  (1.778 m)  Wt 80.287 kg (177 lb)  BMI 25.4 kg/m2  SpO2 97%;  Body mass index is 25.4 kg/(m^2). General-Well developed; no acute distress; anxious; decreased hearing acuity Body habitus-proportionate weight and height Neck-No JVD; no carotid bruits Lungs-clear lung fields; resonant to percussion Cardiovascular-normal PMI; normal S1 and S2 Abdomen-normal bowel sounds; soft and non-tender without masses or organomegaly Musculoskeletal-No deformities, no cyanosis or clubbing Neurologic-Normal cranial nerves; symmetric strength and tone Skin-Warm, no significant lesions Extremities-distal pulses intact; no edema  [place] Bing, MD 11/24/2012  10:30 AM  ASSESSMENT AND PLAN

## 2012-11-24 NOTE — ED Notes (Signed)
Bladder scan performed by EDP, pt wants to try to urinate again on his own, refuses catheter.

## 2012-11-24 NOTE — ED Provider Notes (Signed)
History     CSN: [id number]  Arrival date & time 11/24/12  0113   None     Chief Complaint  Patient presents with  . Urinary Retention    (Consider location/radiation/quality/duration/timing/severity/associated sxs/prior treatment) HPI Comments: 79 year old male with a history of a recently diagnosed urinary infection earlier yesterday who presents with difficulty urinating.  He states that he has had increased urinary retention with decreased flow and volume.  He has some abdominal pain with this but no fevers chills nausea or vomiting.  Nothing seems to make it better or worse, it is gradually getting worse spontaneously.  He has had a urinary catheter in the past related to urinary retention but this has been many years.  The history is provided by the patient and medical records.    Past Medical History  Diagnosis Date  . Diabetes mellitus, type II   . Hyperlipidemia     Lipid profile in 02/2012:135, 227, 41, 49  . Arteriosclerotic cardiovascular disease (ASCVD)     a. CABG x 4 in 1989 (VG->OM1->OM2, VG->RCA, LIMA->LAD), b. 05/2010: DES to VG-OM1/OM2, DES to distal LCx. c. NSTEMI in 04/2011 - TO distal LCX stent and VG->OM2. d. 02/2012 NSTEMI DES to VG-OM1/continuation to OM2 occluded. e. inferior STEMI s/p DES to SVG-RAMUS 06/2012. f. inferolat STEMI 09/2012 s/p DES to SVG-interm.  . Hypertension   . Osteoarthritis   . CVA (cerebral infarction)     details unclear. pt reports light stroke last year involving L leg. No imaging to suggest hemorrhagic etiology  . Pneumonia   . Cervical vertebral fracture   . Chronic back pain   . Benign prostatic hypertrophy     Required urinary catheter x4 weeks for retention  . Peptic ulcer disease   . Gastroesophageal reflux disease   . Ischemic cardiomyopathy     EF 45-50% 3/32014  . DM (diabetes mellitus)   . Anemia     Past Surgical History  Procedure Laterality Date  . Coronary angioplasty with stent placement      2011, 02/2012   . Tonsillectomy    . Coronary artery bypass graft      Family History  Problem Relation Age of Onset  . Early death      Parents died young  . Appendicitis Mother     Pt was 3 year old  . Heart attack Father 44    History  Substance Use Topics  . Smoking status: Former Smoker -- 2.00 packs/day for 10 years    Types: Cigarettes    Quit date: 07/14/1961  . Smokeless tobacco: Current User    Types: Chew  . Alcohol Use: No      Review of Systems  All other systems reviewed and are negative.    Allergies  Review of patient's allergies indicates no known allergies.  Home Medications   Current Outpatient Rx  Name  Route  Sig  Dispense  Refill  . albuterol (PROVENTIL HFA;VENTOLIN HFA) 108 (90 BASE) MCG/ACT inhaler   Inhalation   Inhale 2 puffs into the lungs every 6 (six) hours as needed. For shortness of breath         . aspirin 325 MG tablet   Oral   Take 325 mg by mouth daily.         . B Complex-Biotin-FA (B-COMPLEX PO)   Oral   Take 1 tablet by mouth daily.          Marland Kitchen buPROPion (WELLBUTRIN) 100 MG tablet   Oral  Take 100 mg by mouth 3 (three) times daily.         . calcium carbonate (TUMS - DOSED IN MG ELEMENTAL CALCIUM) 500 MG chewable tablet   Oral   Chew 1 tablet by mouth 4 (four) times daily - after meals and at bedtime.         . COD LIVER OIL PO   Oral   Take 1 tablet by mouth daily.         Tery Sanfilippo Sodium (STOOL SOFTENER) 100 MG capsule   Oral   Take 200 mg by mouth 2 (two) times daily. Constipation         . fish oil-omega-3 fatty acids 1000 MG capsule   Oral   Take 1 g by mouth daily.         . isosorbide mononitrate (IMDUR) 30 MG 24 hr tablet   Oral   Take 15 mg by mouth daily.         Marland Kitchen linagliptin (TRADJENTA) 5 MG TABS tablet   Oral   Take 5 mg by mouth daily.         Marland Kitchen loratadine (CLARITIN) 10 MG tablet   Oral   Take 10 mg by mouth daily.         . metFORMIN (GLUCOPHAGE) 500 MG tablet   Oral    Take 500 mg by mouth 2 (two) times daily. MAY TAKE 1 1/2 TAB IF NEEDED, PT ADJUSTS THIS MED HIMSELF         . Multiple Vitamin (MULTIVITAMIN WITH MINERALS) TABS   Oral   Take 1 tablet by mouth daily.         . nitrofurantoin, macrocrystal-monohydrate, (MACROBID) 100 MG capsule   Oral   Take 1 capsule (100 mg total) by mouth 2 (two) times daily.   14 capsule   0   . nitroGLYCERIN (NITROSTAT) 0.4 MG SL tablet   Sublingual   Place 1 tablet (0.4 mg total) under the tongue every 5 (five) minutes x 3 doses as needed for chest pain.   25 tablet   3   . omeprazole (PRILOSEC) 40 MG capsule   Oral   Take 40 mg by mouth daily.         . Ticagrelor (BRILINTA) 90 MG TABS tablet   Oral   Take 1 tablet (90 mg total) by mouth 2 (two) times daily.   60 tablet   6     There were no vitals taken for this visit.  Physical Exam  Nursing note and vitals reviewed. Constitutional: He appears well-developed and well-nourished. No distress.  HENT:  Head: Normocephalic and atraumatic.  Mouth/Throat: Oropharynx is clear and moist. No oropharyngeal exudate.  Eyes: Conjunctivae and EOM are normal. Pupils are equal, round, and reactive to light. Right eye exhibits no discharge. Left eye exhibits no discharge. No scleral icterus.  Neck: Normal range of motion. Neck supple. No JVD present. No thyromegaly present.  Cardiovascular: Normal rate, regular rhythm, normal heart sounds and intact distal pulses.  Exam reveals no gallop and no friction rub.   No murmur heard. Pulmonary/Chest: Effort normal and breath sounds normal. No respiratory distress. He has no wheezes. He has no rales.  Abdominal: Soft. Bowel sounds are normal. He exhibits no distension and no mass. There is tenderness ( Suprapubic tenderness, no guarding, no masses).  Musculoskeletal: Normal range of motion. He exhibits no edema and no tenderness.  Lymphadenopathy:    He has no cervical adenopathy.  Neurological:  He is alert.  Coordination normal.  Skin: Skin is warm and dry. No rash noted. No erythema.  Psychiatric: He has a normal mood and affect. His behavior is normal.    ED Course  Procedures (including critical care time)  Labs Reviewed - No data to display No results found.   1. UTI (lower urinary tract infection)   2. Urinary retention       MDM  On my bedside ultrasound there does appear to be a significant amount of urine in the bladder, he requested a chance to spontaneously urinate and was able to void 300 cc of urine without difficulty.  At this time he declines a Foley catheter which I think is appropriate if he continues to have this amount of success urinating.  No signs of systemic infection, stable for discharge, antibiotics already filled, he has them at home, he understands discharge instructions.        Vida Roller, MD 11/24/12 [phone]

## 2012-11-24 NOTE — Assessment & Plan Note (Signed)
Chest pain is long-standing and atypical. Imdur is being provided at subtherapeutic dose and will be discontinued. Potassium likewise does not appear necessary.

## 2012-11-24 NOTE — ED Notes (Signed)
Discharge instructions reviewed with pt, questions answered. Pt verbalized understanding.  

## 2012-11-24 NOTE — ED Notes (Signed)
Pt states feels the need to urinate. Pt seen here yesterday & dx w/ UTI.

## 2012-11-24 NOTE — Assessment & Plan Note (Addendum)
Repeat echocardiogram 2 months ago demonstrated some recovery of LV systolic function, which is now only mildly depressed. No clinical congestive heart failure.  Dose of carvedilol will be titrated upwards.

## 2012-11-24 NOTE — ED Notes (Signed)
Pt voided on his own, EDP aware

## 2012-11-26 ENCOUNTER — Encounter (HOSPITAL_COMMUNITY): Payer: Self-pay | Admitting: Emergency Medicine

## 2012-11-26 ENCOUNTER — Observation Stay (HOSPITAL_COMMUNITY)
Admission: EM | Admit: 2012-11-26 | Discharge: 2012-11-27 | Disposition: A | Discharge status: Home / Self Care | Payer: Medicare Other | Attending: Family Medicine | Admitting: Family Medicine

## 2012-11-26 ENCOUNTER — Emergency Department (HOSPITAL_COMMUNITY): Payer: Medicare Other

## 2012-11-26 DIAGNOSIS — E119 Type 2 diabetes mellitus without complications: Secondary | ICD-10-CM | POA: Diagnosis present

## 2012-11-26 DIAGNOSIS — I951 Orthostatic hypotension: Secondary | ICD-10-CM | POA: Insufficient documentation

## 2012-11-26 DIAGNOSIS — D649 Anemia, unspecified: Secondary | ICD-10-CM | POA: Diagnosis present

## 2012-11-26 DIAGNOSIS — R5381 Other malaise: Secondary | ICD-10-CM | POA: Insufficient documentation

## 2012-11-26 DIAGNOSIS — I2510 Atherosclerotic heart disease of native coronary artery without angina pectoris: Secondary | ICD-10-CM | POA: Diagnosis present

## 2012-11-26 DIAGNOSIS — I255 Ischemic cardiomyopathy: Secondary | ICD-10-CM

## 2012-11-26 DIAGNOSIS — I10 Essential (primary) hypertension: Secondary | ICD-10-CM | POA: Insufficient documentation

## 2012-11-26 DIAGNOSIS — R42 Dizziness and giddiness: Secondary | ICD-10-CM | POA: Insufficient documentation

## 2012-11-26 DIAGNOSIS — Z951 Presence of aortocoronary bypass graft: Secondary | ICD-10-CM | POA: Insufficient documentation

## 2012-11-26 DIAGNOSIS — R079 Chest pain, unspecified: Principal | ICD-10-CM | POA: Diagnosis present

## 2012-11-26 DIAGNOSIS — R0602 Shortness of breath: Secondary | ICD-10-CM | POA: Insufficient documentation

## 2012-11-26 DIAGNOSIS — I2589 Other forms of chronic ischemic heart disease: Secondary | ICD-10-CM | POA: Insufficient documentation

## 2012-11-26 DIAGNOSIS — I25810 Atherosclerosis of coronary artery bypass graft(s) without angina pectoris: Secondary | ICD-10-CM | POA: Diagnosis present

## 2012-11-26 LAB — GLUCOSE, CAPILLARY
Glucose-Capillary: 234 mg/dL — ABNORMAL HIGH (ref 70–99)
Glucose-Capillary: 231 mg/dL — ABNORMAL HIGH (ref 70–99)

## 2012-11-26 LAB — URINALYSIS, ROUTINE W REFLEX MICROSCOPIC
Bilirubin Urine: NEGATIVE
Glucose, UA: 500 mg/dL — AB
Hgb urine dipstick: NEGATIVE
Protein, ur: NEGATIVE mg/dL
Urobilinogen, UA: 0.2 mg/dL (ref 0.0–1.0)

## 2012-11-26 LAB — CBC WITH DIFFERENTIAL/PLATELET
Basophils Relative: 0 % (ref 0–1)
Eosinophils Absolute: 0.1 10*3/uL (ref 0.0–0.7)
Eosinophils Relative: 1 % (ref 0–5)
HCT: 32.8 % — ABNORMAL LOW (ref 39.0–52.0)
Hemoglobin: 10.9 g/dL — ABNORMAL LOW (ref 13.0–17.0)
MCH: 28.5 pg (ref 26.0–34.0)
MCHC: 33.2 g/dL (ref 30.0–36.0)
MCV: 85.6 fL (ref 78.0–100.0)
Monocytes Absolute: 0.5 10*3/uL (ref 0.1–1.0)
Monocytes Relative: 7 % (ref 3–12)

## 2012-11-26 LAB — TROPONIN I: Troponin I: <0.3 ng/mL (ref <0.30)

## 2012-11-26 LAB — BASIC METABOLIC PANEL
BUN: 13 mg/dL (ref 6–23)
Creatinine, Ser: 0.9 mg/dL (ref 0.50–1.35)
GFR calc Af Amer: >90 mL/min (ref >90)
GFR calc non Af Amer: 78 mL/min — ABNORMAL LOW (ref >90)
Glucose, Bld: 284 mg/dL — ABNORMAL HIGH (ref 70–99)

## 2012-11-26 MED ORDER — NITROGLYCERIN 0.4 MG SL SUBL: 0.4000 mg | SUBLINGUAL_TABLET | SUBLINGUAL | Status: DC | PRN

## 2012-11-26 MED ORDER — DOCUSATE SODIUM 100 MG PO CAPS
100.0000 mg | ORAL_CAPSULE | Freq: Two times a day (BID) | ORAL | Status: DC
  Administered 2012-11-26 – 2012-11-27 (×2): 100 mg via ORAL
  Filled 2012-11-26 (×2): qty 1

## 2012-11-26 MED ORDER — ONDANSETRON HCL 4 MG PO TABS: 4.0000 mg | ORAL_TABLET | Freq: Four times a day (QID) | ORAL | Status: DC | PRN

## 2012-11-26 MED ORDER — LEVOFLOXACIN 250 MG PO TABS: 250.0000 mg | ORAL_TABLET | Freq: Every day | ORAL | Status: DC

## 2012-11-26 MED ORDER — BUPROPION HCL 100 MG PO TABS
100.0000 mg | ORAL_TABLET | Freq: Three times a day (TID) | ORAL | Status: DC
  Administered 2012-11-26 – 2012-11-27 (×2): 100 mg via ORAL
  Filled 2012-11-26 (×9): qty 1

## 2012-11-26 MED ORDER — FERROUS SULFATE 325 (65 FE) MG PO TABS
325.0000 mg | ORAL_TABLET | Freq: Every day | ORAL | Status: DC
  Administered 2012-11-27: 325 mg via ORAL
  Filled 2012-11-26: qty 1

## 2012-11-26 MED ORDER — CARVEDILOL 3.125 MG PO TABS
6.2500 mg | ORAL_TABLET | Freq: Two times a day (BID) | ORAL | Status: DC
  Filled 2012-11-26: qty 2

## 2012-11-26 MED ORDER — ALBUTEROL SULFATE HFA 108 (90 BASE) MCG/ACT IN AERS: 2.0000 | INHALATION_SPRAY | Freq: Four times a day (QID) | RESPIRATORY_TRACT | Status: DC | PRN

## 2012-11-26 MED ORDER — ASPIRIN 325 MG PO TABS
325.0000 mg | ORAL_TABLET | Freq: Every day | ORAL | Status: DC
  Administered 2012-11-27: 325 mg via ORAL
  Filled 2012-11-26: qty 1

## 2012-11-26 MED ORDER — SODIUM CHLORIDE 0.9 % IV SOLN
INTRAVENOUS | Status: AC
  Administered 2012-11-26: 16:00:00 via INTRAVENOUS

## 2012-11-26 MED ORDER — ACETAMINOPHEN 650 MG RE SUPP: 650.0000 mg | Freq: Four times a day (QID) | RECTAL | Status: DC | PRN

## 2012-11-26 MED ORDER — ONDANSETRON HCL 4 MG/2ML IJ SOLN: 4.0000 mg | Freq: Four times a day (QID) | INTRAMUSCULAR | Status: DC | PRN

## 2012-11-26 MED ORDER — ACETAMINOPHEN 325 MG PO TABS: 650.0000 mg | ORAL_TABLET | Freq: Four times a day (QID) | ORAL | Status: DC | PRN

## 2012-11-26 MED ORDER — PANTOPRAZOLE SODIUM 40 MG PO TBEC
40.0000 mg | DELAYED_RELEASE_TABLET | Freq: Every day | ORAL | Status: DC
  Administered 2012-11-27: 40 mg via ORAL
  Filled 2012-11-26: qty 1

## 2012-11-26 MED ORDER — LORATADINE 10 MG PO TABS
10.0000 mg | ORAL_TABLET | Freq: Every day | ORAL | Status: DC
  Administered 2012-11-27: 10 mg via ORAL
  Filled 2012-11-26: qty 1

## 2012-11-26 MED ORDER — SODIUM CHLORIDE 0.9 % IJ SOLN: 3.0000 mL | Freq: Two times a day (BID) | INTRAMUSCULAR | Status: DC

## 2012-11-26 MED ORDER — TICAGRELOR 90 MG PO TABS
90.0000 mg | ORAL_TABLET | Freq: Two times a day (BID) | ORAL | Status: DC
  Administered 2012-11-26 – 2012-11-27 (×2): 90 mg via ORAL
  Filled 2012-11-26 (×6): qty 1

## 2012-11-26 MED ORDER — CALCIUM CARBONATE ANTACID 500 MG PO CHEW
1.0000 | CHEWABLE_TABLET | Freq: Three times a day (TID) | ORAL | Status: DC
  Administered 2012-11-26 – 2012-11-27 (×3): 200 mg via ORAL
  Filled 2012-11-26 (×3): qty 1

## 2012-11-26 NOTE — Progress Notes (Signed)
Utilization Review Complete  

## 2012-11-26 NOTE — ED Provider Notes (Addendum)
History    This chart was scribed for Dione Booze, MD, by Concha Se, ED Scribe and Bennett Scrape, ED Scribe. The patient was seen in room APA10/APA10 and the patient's care was started at 10:40AM.     CSN: [id number]  Arrival date & time 11/26/12  1009      Chief Complaint  Patient presents with  . Chest Pain  . Shortness of Breath    The history is provided by the patient and medical records. No language interpreter was used.   HPI Comments: Jeffrey [name] is a 79 y.o. male with h/o coronary angioplasty with stent placement in March 2014 who presents to the Emergency Department complaining of 2 to 3 episodes of bilateral anterior chest pain described as aching and lasting only "a few seconds" with onset of today that woke him out of his sleep. Pt states the severity of his pain as 5/10 and denies any known triggers.  He denies having CP currently. He admits that he has CP "every once in a while" and reports that he has a generalized feeling of weakness at his baseline. He lists dizziness with standing and mild SOB as associated symptoms. He also uses an inhaler 2 to 3 times with no improvement in the SOB. He also expresses concern that his blood pressure dropped 30 points when going from sitting to standing. BP in the ED is 120/60.  He reports he is currently take aspirin (regular strength) and an anticoagulant for ASCVD. Pt denies nausea, emesis and diaphoresis as associated symptoms.   Past Medical History  Diagnosis Date  . Diabetes mellitus, type II   . Hyperlipidemia     Lipid profile in 02/2012:135, 227, 41, 49  . Arteriosclerotic cardiovascular disease (ASCVD)     a. CABG x 4 in 1989 (VG->OM1->OM2, VG->RCA, LIMA->LAD), b. 05/2010: DES to VG-OM1/OM2, DES to distal LCx. c. NSTEMI in 04/2011 - TO distal LCX stent and VG->OM2. d. 02/2012 NSTEMI DES to VG-OM1/continuation to OM2 occluded. e. inferior STEMI s/p DES to SVG-RAMUS 06/2012. f. inferolat STEMI 09/2012 s/p DES to  SVG-interm.  . Hypertension   . Osteoarthritis   . CVA (cerebral infarction)     details unclear. pt reports light stroke last year involving L leg. No imaging to suggest hemorrhagic etiology  . Pneumonia   . Cervical vertebral fracture   . Chronic back pain   . Benign prostatic hypertrophy     Required urinary catheter x4 weeks for retention  . Peptic ulcer disease   . Gastroesophageal reflux disease   . Ischemic cardiomyopathy     EF 45-50% 3/32014  . DM (diabetes mellitus)   . Anemia     Past Surgical History  Procedure Laterality Date  . Coronary angioplasty with stent placement      2011, 02/2012  . Tonsillectomy    . Coronary artery bypass graft      Family History  Problem Relation Age of Onset  . Early death      Parents died young  . Appendicitis Mother     Pt was 10 year old  . Heart attack Father 52    History  Substance Use Topics  . Smoking status: Former Smoker -- 2.00 packs/day for 10 years    Types: Cigarettes    Quit date: 07/14/1961  . Smokeless tobacco: Current User    Types: Chew  . Alcohol Use: No      Review of Systems  Constitutional: Negative for diaphoresis.  Cardiovascular: Positive  for chest pain. Negative for palpitations.  Gastrointestinal: Negative for nausea.  Neurological: Positive for dizziness. Negative for weakness and numbness.    Allergies  Review of patient's allergies indicates no known allergies.  Home Medications   Current Outpatient Rx  Name  Route  Sig  Dispense  Refill  . albuterol (PROVENTIL HFA;VENTOLIN HFA) 108 (90 BASE) MCG/ACT inhaler   Inhalation   Inhale 2 puffs into the lungs every 6 (six) hours as needed. For shortness of breath         . aspirin 325 MG tablet   Oral   Take 325 mg by mouth daily.         . B Complex-Biotin-FA (B-COMPLEX PO)   Oral   Take 1 tablet by mouth daily.          Marland Kitchen buPROPion (WELLBUTRIN) 100 MG tablet   Oral   Take 100 mg by mouth 3 (three) times daily.          . calcium carbonate (TUMS - DOSED IN MG ELEMENTAL CALCIUM) 500 MG chewable tablet   Oral   Chew 1 tablet by mouth 4 (four) times daily - after meals and at bedtime.         . COD LIVER OIL PO   Oral   Take 1 tablet by mouth daily.         Tery Sanfilippo Sodium (STOOL SOFTENER) 100 MG capsule   Oral   Take 200 mg by mouth 2 (two) times daily. Constipation         . ferrous sulfate 325 (65 FE) MG tablet   Oral   Take 325 mg by mouth daily with breakfast.         . fish oil-omega-3 fatty acids 1000 MG capsule   Oral   Take 1 g by mouth daily.         . isosorbide mononitrate (IMDUR) 30 MG 24 hr tablet   Oral   Take 15 mg by mouth daily.         Marland Kitchen linagliptin (TRADJENTA) 5 MG TABS tablet   Oral   Take 5 mg by mouth daily.         Marland Kitchen loratadine (CLARITIN) 10 MG tablet   Oral   Take 10 mg by mouth daily.         . metFORMIN (GLUCOPHAGE) 500 MG tablet   Oral   Take 500 mg by mouth 2 (two) times daily. MAY TAKE 1 1/2 TAB IF NEEDED, PT ADJUSTS THIS MED HIMSELF         . Multiple Vitamin (MULTIVITAMIN WITH MINERALS) TABS   Oral   Take 1 tablet by mouth daily.         . nitrofurantoin, macrocrystal-monohydrate, (MACROBID) 100 MG capsule   Oral   Take 1 capsule (100 mg total) by mouth 2 (two) times daily.   14 capsule   0   . nitroGLYCERIN (NITROSTAT) 0.4 MG SL tablet   Sublingual   Place 1 tablet (0.4 mg total) under the tongue every 5 (five) minutes x 3 doses as needed for chest pain.   25 tablet   3   . omeprazole (PRILOSEC) 40 MG capsule   Oral   Take 40 mg by mouth daily.         . Ticagrelor (BRILINTA) 90 MG TABS tablet   Oral   Take 1 tablet (90 mg total) by mouth 2 (two) times daily.   60 tablet   6  BP 144/68  Pulse 100  Temp(Src) 98.1 F (36.7 C) (Oral)  Resp 20  Wt 170 lb (77.111 kg)  BMI 26.62 kg/m2  SpO2 98%  Physical Exam  Nursing note and vitals reviewed. Constitutional: He is oriented to person, place, and time.  He appears well-developed and well-nourished. No distress.  HENT:  Head: Normocephalic and atraumatic.  Eyes: EOM are normal.  Neck: Normal range of motion. Neck supple. No tracheal deviation present.  Cardiovascular: Normal rate, regular rhythm, normal heart sounds and intact distal pulses.   Pulmonary/Chest: Effort normal and breath sounds normal. No respiratory distress.  Abdominal: Soft. He exhibits no distension. There is no tenderness.  Genitourinary: Rectum normal.  Musculoskeletal: Normal range of motion.  Neurological: He is alert and oriented to person, place, and time.  Skin: Skin is warm and dry.  Psychiatric: He has a normal mood and affect. His behavior is normal. Judgment normal.    ED Course  Procedures (including critical care time)  DIAGNOSTIC STUDIES: Oxygen Saturation is 98% on room air, normal by my interpretation.    COORDINATION OF CARE: 10:49 AM- Informed pt that no new EKG changes were noted. Reviewed last Cariology visit note in room with pt. Discussed ED treatment with pt and pt agrees to treatment plan.   Results for orders placed during the hospital encounter of 11/26/12  CBC WITH DIFFERENTIAL      Result Value Range   WBC 6.8  4.0 - 10.5 K/uL   RBC 3.83 (*) 4.22 - 5.81 MIL/uL   Hemoglobin 10.9 (*) 13.0 - 17.0 g/dL   HCT 16.1 (*) 09.6 - 04.5 %   MCV 85.6  78.0 - 100.0 fL   MCH 28.5  26.0 - 34.0 pg   MCHC 33.2  30.0 - 36.0 g/dL   RDW 40.9  81.1 - 91.4 %   Platelets 184  150 - 400 K/uL   Neutrophils Relative % 79 (*) 43 - 77 %   Neutro Abs 5.4  1.7 - 7.7 K/uL   Lymphocytes Relative 12  12 - 46 %   Lymphs Abs 0.8  0.7 - 4.0 K/uL   Monocytes Relative 7  3 - 12 %   Monocytes Absolute 0.5  0.1 - 1.0 K/uL   Eosinophils Relative 1  0 - 5 %   Eosinophils Absolute 0.1  0.0 - 0.7 K/uL   Basophils Relative 0  0 - 1 %   Basophils Absolute 0.0  0.0 - 0.1 K/uL  BASIC METABOLIC PANEL      Result Value Range   Sodium 132 (*) 135 - 145 mEq/L   Potassium  4.4  3.5 - 5.1 mEq/L   Chloride 96  96 - 112 mEq/L   CO2 22  19 - 32 mEq/L   Glucose, Bld 284 (*) 70 - 99 mg/dL   BUN 13  6 - 23 mg/dL   Creatinine, Ser 7.82  0.50 - 1.35 mg/dL   Calcium 9.3  8.4 - 95.6 mg/dL   GFR calc non Af Amer 78 (*) >90 mL/min   GFR calc Af Amer >90  >90 mL/min  TROPONIN I      Result Value Range   Troponin I <0.30  <0.30 ng/mL  URINALYSIS, ROUTINE W REFLEX MICROSCOPIC      Result Value Range   Color, Urine YELLOW  YELLOW   APPearance CLEAR  CLEAR   Specific Gravity, Urine 1.010  1.005 - 1.030   pH 7.0  5.0 - 8.0  Glucose, UA 500 (*) NEGATIVE mg/dL   Hgb urine dipstick NEGATIVE  NEGATIVE   Bilirubin Urine NEGATIVE  NEGATIVE   Ketones, ur NEGATIVE  NEGATIVE mg/dL   Protein, ur NEGATIVE  NEGATIVE mg/dL   Urobilinogen, UA 0.2  0.0 - 1.0 mg/dL   Nitrite NEGATIVE  NEGATIVE   Leukocytes, UA NEGATIVE  NEGATIVE   Dg Chest 2 View  11/15/2012   *RADIOLOGY REPORT*  Clinical Data: Chest pain  CHEST - 2 VIEW  Comparison: 11/05/2012; 10/21/2012; 10/16/2012  Findings:  Grossly unchanged cardiac silhouette and mediastinal contours post median sternotomy and CABG.  Atherosclerotic calcifications within a mildly tortuous thoracic aorta.  The lungs appear mildly hyperexpanded with flattening of bilateral hemidiaphragms and mild diffuse thickening of the pulmonary interstitium.  No new focal airspace opacity.  No pleural effusion or pneumothorax.  No definite evidence of edema.  Unchanged bones.  IMPRESSION: Mild lung hyperexpansion without acute cardiopulmonary disease.   Original Report Authenticated By: Tacey Ruiz, MD   Dg Chest 2 View  11/05/2012   *RADIOLOGY REPORT*  Clinical Data: Chest pain  CHEST - 2 VIEW  Comparison: November 04, 2012.  Findings: Sternotomy wires are noted.  Cardiomediastinal silhouette appears normal.  No acute pulmonary disease is noted.  Bony thorax is intact.  No pleural effusion or pneumothorax is noted.  IMPRESSION: No acute cardiopulmonary abnormality  seen.   Original Report Authenticated By: Lupita Raider.,  M.D.   Dg Chest 2 View  11/04/2012   *RADIOLOGY REPORT*  Clinical Data: Weakness and hypoglycemia.  Ex-smoker.  Diabetes.  CHEST - 2 VIEW  Comparison: 10/21/2012  Findings: Mild hyperinflation. Lateral view degraded by patient arm position.  Prior median sternotomy. Midline trachea.  Normal heart size without the atherosclerosis in the transverse aorta.  Tortuous descending thoracic aorta. No pleural effusion or pneumothorax. Clear lungs.  No congestive failure.  IMPRESSION: Hyperinflation, without acute disease.   Original Report Authenticated By: Jeronimo Greaves, M.D.   Dg Chest Portable 1 View  11/26/2012   *RADIOLOGY REPORT*  Clinical Data: Chest pain, shortness of breath  PORTABLE CHEST - 1 VIEW  Comparison: 11/15/2012  Findings: Prior coronary bypass changes.  Normal heart size and vascularity.  Lungs clear.  No edema or pneumonia.  Negative for effusion or pneumothorax.  IMPRESSION: Stable postoperative findings.  No superimposed acute process.   Original Report Authenticated By: Judie Petit. Miles Costain, M.D.    Date: 11/26/2012  Rate: 93  Rhythm: normal sinus rhythm  QRS Axis: normal  Intervals: normal  ST/T Wave abnormalities: nonspecific ST/T changes  Conduction Disutrbances:none  Narrative Interpretation: Nonspecific ST and T changes. When compared with ECG of 11/22/2012, no significant changes are seen.  Old EKG Reviewed: unchanged   1. Chest pain   2. Orthostatic hypotension       MDM  Episodes of chest pain do not sound especially worrisome for cardiac disease. Report of orthostatic hypotension. Old records are reviewed and he has severe coronary artery disease with bypass and stent present. He has significant amount of myocardium at risk in areas that were not amenable to bypass or stenting.  He has remained pain-free in the ED. Orthostatic vital signs to show significant drop in blood pressure. Given his history, I feel he needs to  be observed with serial cardiac markers and needs IV hydration to try and improve his orthostatic hypotension. Case is discussed with Dr. Irene Limbo of [facility] who agrees to admit the patient under observation status.  I personally performed the  services described in this documentation, which was scribed in my presence. The recorded information has been reviewed and is accurate.      Dione Booze, MD 11/26/12 1332  Dione Booze, MD 11/26/12 1332  Dione Booze, MD 11/26/12 1539

## 2012-11-26 NOTE — H&P (Signed)
History and Physical  Jeffrey [name] NGE:[id number] DOB: 1929/03/30 DOA: 11/26/2012  Referring physician: Dr. Preston Fleeting PCP: [name], MD   Chief Complaint:  Generalized weakness, chest pain, lightheaded  HPI:  79 year old man presented to the emergency department with complaint of chest pain today as well as lightheadedness and generalized weakness. Given his known cardiac disease and demonstrated orthostatic hypotension observation was requested for cardiac rule out and IV fluids.  History obtained from patient and chart review. Patient with known history of coronary artery disease. Admitted for chest pain 10/2012 [facility] and discharged with negative cardiac enzymes. Admitted at the end of April seen in consultation with cardiology and transferred to [facility] for cardiac catheterization. Left heart catheterization revealed severe three-vessel disease with no new lesions or stenoses. Medical management was recommended. He was subsequently seen in cardiology clinic 5/8 at which time he was continued on his chronic medications with the exception of an increase of Coreg in the discontinuation of Imdur. At that time it was noted the patient had long-standing atypical chest pain. Other issues appear to be stable at that time.  Per patient reports he has felt generally weak for the last month. No focal weakness. He noted lightheadedness today and check his blood pressure at home and reports orthostasis with decreased blood pressure upon standing. He also had 2 episodes of chest pain this morning. He is somewhat vague as to the timing and duration the nose they were precordial in location, 5/10 in intensity. He took aspirin and his symptoms subsided. He had nausea, vomiting, diaphoresis, left arm neck or jaw pain. He has had no recurrence of the pain in the emergency department. He does not describe any aggravating or alleviating factors regard to the pain. He has difficulty characterizing the pain.    in the emergency department he was noted to be afebrile with stable vital signs. Screening laboratory studies were unremarkable intrapartum was negative. EKG was nonacute with no significant changes since last study. Chest x-ray was negative.   Review of Systems:  Negative for fever, visual changes, sore throat, rash, new muscle aches, dysuria, bleeding, n/v/abdominal pain.  Past Medical History  Diagnosis Date  . Diabetes mellitus, type II   . Hyperlipidemia     Lipid profile in 02/2012:135, 227, 41, 49  . Arteriosclerotic cardiovascular disease (ASCVD)     a. CABG x 4 in 1989 (VG->OM1->OM2, VG->RCA, LIMA->LAD), b. 05/2010: DES to VG-OM1/OM2, DES to distal LCx. c. NSTEMI in 04/2011 - TO distal LCX stent and VG->OM2. d. 02/2012 NSTEMI DES to VG-OM1/continuation to OM2 occluded. e. inferior STEMI s/p DES to SVG-RAMUS 06/2012. f. inferolat STEMI 09/2012 s/p DES to SVG-interm.  . Hypertension   . Osteoarthritis   . CVA (cerebral infarction)     details unclear. pt reports light stroke last year involving L leg. No imaging to suggest hemorrhagic etiology  . Pneumonia   . Cervical vertebral fracture   . Chronic back pain   . Benign prostatic hypertrophy     Required urinary catheter x4 weeks for retention  . Peptic ulcer disease   . Gastroesophageal reflux disease   . Ischemic cardiomyopathy     EF 45-50% 3/32014  . DM (diabetes mellitus)   . Anemia     Past Surgical History  Procedure Laterality Date  . Coronary angioplasty with stent placement      2011, 02/2012  . Tonsillectomy    . Coronary artery bypass graft      Social History:  reports that he quit smoking about 51 years ago. His smoking use included Cigarettes. He has a 20 pack-year smoking history. His smokeless tobacco use includes Chew. He reports that he does not drink alcohol or use illicit drugs.  No Known Allergies  Family History  Problem Relation Age of Onset  . Early death      Parents died young  .  Appendicitis Mother     Pt was 73 year old  . Heart attack Father 40     Prior to Admission medications   Medication Sig Start Date End Date Taking? Authorizing Provider  albuterol (PROVENTIL HFA;VENTOLIN HFA) 108 (90 BASE) MCG/ACT inhaler Inhale 2 puffs into the lungs every 6 (six) hours as needed. For shortness of breath   Yes Historical Provider, MD  aspirin 325 MG tablet Take 325 mg by mouth daily.   Yes Historical Provider, MD  B Complex-Biotin-FA (B-COMPLEX PO) Take 1 tablet by mouth daily.    Yes Historical Provider, MD  buPROPion (WELLBUTRIN) 100 MG tablet Take 100 mg by mouth 3 (three) times daily.   Yes Historical Provider, MD  calcium carbonate (TUMS - DOSED IN MG ELEMENTAL CALCIUM) 500 MG chewable tablet Chew 1 tablet by mouth 4 (four) times daily - after meals and at bedtime.   Yes Historical Provider, MD  COD LIVER OIL PO Take 1 tablet by mouth daily.   Yes Historical Provider, MD  Docusate Sodium (STOOL SOFTENER) 100 MG capsule Take 200 mg by mouth 2 (two) times daily. Constipation   Yes Historical Provider, MD  ferrous sulfate 325 (65 FE) MG tablet Take 325 mg by mouth daily with breakfast.   Yes Historical Provider, MD  fish oil-omega-3 fatty acids 1000 MG capsule Take 1 g by mouth daily.   Yes Historical Provider, MD  isosorbide mononitrate (IMDUR) 30 MG 24 hr tablet Take 15 mg by mouth daily. 11/13/12  Yes Historical Provider, MD  linagliptin (TRADJENTA) 5 MG TABS tablet Take 5 mg by mouth daily.   Yes Historical Provider, MD  loratadine (CLARITIN) 10 MG tablet Take 10 mg by mouth daily.   Yes Historical Provider, MD  metFORMIN (GLUCOPHAGE) 500 MG tablet Take 500 mg by mouth 2 (two) times daily. MAY TAKE 1 1/2 TAB IF NEEDED, PT ADJUSTS THIS MED HIMSELF 11/11/12  Yes Rhonda G Barrett, PA-C  Multiple Vitamin (MULTIVITAMIN WITH MINERALS) TABS Take 1 tablet by mouth daily.   Yes Historical Provider, MD  nitrofurantoin, macrocrystal-monohydrate, (MACROBID) 100 MG capsule Take 1 capsule  (100 mg total) by mouth 2 (two) times daily. 11/22/12  Yes Lyanne Co, MD  nitroGLYCERIN (NITROSTAT) 0.4 MG SL tablet Place 1 tablet (0.4 mg total) under the tongue every 5 (five) minutes x 3 doses as needed for chest pain. 11/11/12 11/11/13 Yes Rhonda G Barrett, PA-C  omeprazole (PRILOSEC) 40 MG capsule Take 40 mg by mouth daily.   Yes Historical Provider, MD  Ticagrelor (BRILINTA) 90 MG TABS tablet Take 1 tablet (90 mg total) by mouth 2 (two) times daily. 06/21/12  Yes Gery Pray, PA-C   Physical Exam: Filed Vitals:   11/26/12 1246 11/26/12 1310 11/26/12 1348 11/26/12 1400  BP: 122/58 129/58 129/58 129/65  Pulse: 77 78 80   Temp:      TempSrc:  Oral Oral   Resp: 17 20 16    Weight:      SpO2: 97% 96%     General: Examined in the emergency department. Appears calm and comfortable Eyes: Eyes appear grossly  normal. Lids appear unremarkable. ENT: grossly normal hearing, lips  Neck: no LAD, masses or thyromegaly Cardiovascular: RRR, no m/r/g. No LE edema. Respiratory: CTA bilaterally, no w/r/r. Normal respiratory effort. Abdomen: soft, ntnd Skin: no rash or induration seen on limited exam Musculoskeletal: grossly normal tone BUE/BLE. Excellent upper and lower extremity strength.  Psychiatric: grossly normal mood and affect, speech fluent and appropriate Neurologic: grossly non-focal.  Wt Readings from Last 3 Encounters:  11/26/12 77.111 kg (170 lb)  11/24/12 77.111 kg (170 lb)  11/23/12 77.111 kg (170 lb)    Labs on Admission:  Basic Metabolic Panel:  Recent Labs Lab 11/22/12 1704 11/26/12 1101  NA 132* 132*  K 4.2 4.4  CL 96 96  CO2 22 22  GLUCOSE 168* 284*  BUN 14 13  CREATININE 0.95 0.90  CALCIUM 9.3 9.3    CBC:  Recent Labs Lab 11/22/12 1704 11/26/12 1101  WBC 8.6 6.8  NEUTROABS 6.1 5.4  HGB 11.4* 10.9*  HCT 34.2* 32.8*  MCV 84.0 85.6  PLT 211 184    Cardiac Enzymes:  Recent Labs Lab 11/22/12 1704 11/26/12 1101  TROPONINI <0.30 <0.30     CBG:  Recent Labs Lab 11/23/12 1814  GLUCAP 126*     Radiological Exams on Admission: Dg Chest Portable 1 View  11/26/2012   *RADIOLOGY REPORT*  Clinical Data: Chest pain, shortness of breath  PORTABLE CHEST - 1 VIEW  Comparison: 11/15/2012  Findings: Prior coronary bypass changes.  Normal heart size and vascularity.  Lungs clear.  No edema or pneumonia.  Negative for effusion or pneumothorax.  IMPRESSION: Stable postoperative findings.  No superimposed acute process.   Original Report Authenticated By: Judie Petit. Miles Costain, M.D.    EKG: Independently reviewed. Sinus rhythm. T-wave inversion inferolaterally. When compared to previous study 11/22/2012, no acute changes seen. T wave inversions well.   Active Problems:   Arteriosclerotic cardiovascular disease (ASCVD)   Diabetes mellitus, type II   Cardiomyopathy, ischemic   Anemia, normocytic normochromic   Assessment/Plan 1. Chest pain: Atypical in nature, ACS is doubted at this point. Currently pain-free. Given history of known three-vessel coronary artery disease plan observation overnight with serial cardiac enzymes. EKG without change. Continue chronic medications including aspirin, ticagrelor, Coreg. Cardiology discontinued Imdur on last office visit. 2. Severe three-vessel obstructive coronary disease: No new lesions or recurrent stenosis by heart catheterization 4/30. Medical management recommended. 3. Orthostatic hypotension: Likely related to poor oral intake, possibly complicated by increased dose of Coreg in last office visit. IV fluids. Repeat orthostatics in the morning. Decrease Coreg dose. 4. Generalized weakness: no focal deficits. Suspect debilitation. 5. Diabetes mellitus type 2: Appears stable. Hold metformin while inpatient. Continue sliding scale insulin.  6. Ischemic cardiomyopathy: Left ventricular ejection fraction 45-50% by echocardiogram 09/2012. Appears stable. Continue beta blocker. 7. UTI: Complete  Levaquin. 8. Frequent ER visits: Review of records demonstrates the patient has been refusing home health. He has been compliant with cardiology followup.   Code Status: Full code  Family Communication: None present  Disposition Plan/Anticipated LOS: Observation. Anticipate discharge 5/17.  Time spent: 50  minutes  Brendia Sacks, MD  [facility] Pager [phone] 11/26/2012, 2:08 PM

## 2012-11-26 NOTE — ED Notes (Signed)
Patient will not take his NTG tablets.  States he has a friend who takes them and has to lay on the floor afterwards.  Encouraged patient to use his NTG when needed. Explained effects and that it may not affect him like it does his friend

## 2012-11-26 NOTE — ED Notes (Signed)
Pt states increased weakness,SOB and chest pain this am around 6am.

## 2012-11-27 LAB — BASIC METABOLIC PANEL
Calcium: 9 mg/dL (ref 8.4–10.5)
GFR calc non Af Amer: 82 mL/min — ABNORMAL LOW (ref >90)
Sodium: 134 mEq/L — ABNORMAL LOW (ref 135–145)

## 2012-11-27 LAB — TROPONIN I: Troponin I: <0.3 ng/mL (ref <0.30)

## 2012-11-27 LAB — GLUCOSE, CAPILLARY: Glucose-Capillary: 254 mg/dL — ABNORMAL HIGH (ref 70–99)

## 2012-11-27 MED ORDER — SODIUM CHLORIDE 0.9 % IV SOLN
INTRAVENOUS | Status: DC
  Administered 2012-11-27: 13:00:00 via INTRAVENOUS

## 2012-11-27 NOTE — Progress Notes (Signed)
D/c instructions reviewed with patient.  Verbalized understanding.  Pt dc'd to home. Schonewitz, Candelaria Stagers 11/27/2012

## 2012-11-27 NOTE — Discharge Summary (Signed)
Physician Discharge Summary  Jeffrey [name] NFA:[id number] DOB: 01-23-1929 DOA: 11/26/2012  PCP: [name], MD  Admit date: 11/26/2012 Discharge date: 11/27/2012  Recommendations for Outpatient Follow-up:  1. Followup orthostatic hypotension 2. Consider assisted living facility 3. Consider further evaluation and treatment of depression and anxiety  Follow-up Information   Follow up with [name], MD In 1 week.   Contact information:   [address] [place] [address] [phone]      Discharge Diagnoses:  1. Atypical chest pain 2. Orthostatic hypotension 3. Anxiety, depression 4. Severe three-vessel coronary artery disease 5. Diabetes mellitus type 2  Discharge Condition: Improved Disposition: Home  Diet recommendation: Diabetic diet  Filed Weights   11/26/12 1017 11/26/12 2010  Weight: 77.111 kg (170 lb) 77.4 kg (170 lb 10.2 oz)    History of present illness:  79 year old man presented to the emergency department with complaint of chest pain today as well as lightheadedness and generalized weakness. Given his known cardiac disease and demonstrated orthostatic hypotension observation was requested for cardiac rule out and IV fluids.  Hospital Course:  [name] was admitted for observation for chest pain and orthostatic hypotension. He ruled out with serial cardiac enzymes and telemetry was unremarkable. Chest pain atypical in nature and has been extensively investigated in the recent past and medical management has been recommended regardless. Orthostasis is a subacute issue. He has been seen in the emergency department several times prior to this admission, each time noted to be orthostatic. This is also been an issue addressed at his last cardiology visit earlier this month when bupropion and Zoloft were discontinued. Coreg was discontinued prior to admission for recurrent orthostasis. Symptomatically orthostasis has improved.  Anxiety and depression playing a large role. Individual issues as below.  1. Atypical chest pain: Cardiac enzymes negative. Resolved. No further evaluation suggested. 2. Orthostatic hypotension: Appears stable. Counseled on slow position changes. Compression hose. Coreg discontinued as were other medications that may be contributing. This is a recurrent subacute issue and further hospitalization is not likely be of benefit. Midodrine could be considered as an outpatient. Patient has never fallen. 3. Generalized weakness: No focal deficits. Patient able to stand without assistance. 4. Severe three-vessel coronary artery disease: No new lesions or recurrent stenosis by heart catheterization 4/30. Continue medical management. Note patient not on a beta blocker secondary to symptomatic orthostatic hypotension. 5. Diabetes mellitus type 2: Stable. Resume metformin on discharge. 6. Urinary tract infection diagnosed prior to admission: Complete previously prescribed Levaquin. 7. Frequent ER visits: Review of the record demonstrates patient has been refusing home health.  Patient voluntarily expresses one of his major problems is depression and anxiety which he cannot control. He lives alone and frequently becomes fearful about his health condition. Although he has a daughter nearby he is not feel he can live with her for unclear reasons. He expresses interest in assisted living but by chart review apparently was refusing social work assistance as an outpatient and may be discharged from home health as well. He will need close followup with his primary care physician. I think assisted living would be excellent for him. He appears to have received maximum medical benefit from this hospitalization.  Discharge Instructions  Discharge Orders   Future Appointments Provider Department Dept Phone   12/10/2012 2:40 PM Jodelle Gross, NP [facility] at [place] [phone]   Future Orders Complete By  Expires     Diet general  As directed  Discharge instructions  As directed     Comments:      Be sure to take your time making slow transitions between lying, sitting and standing. If you become lightheaded while standing or sitting, lying down. Be sure to consume enough fluids daily. Finish Levaquin prescribed by emergency department recently. Call your physician or seek immediate medical attention for chest pain, worsening of condition, passing out.    Increase activity slowly  As directed         Medication List    STOP taking these medications       buPROPion 100 MG tablet  Commonly known as:  WELLBUTRIN     isosorbide mononitrate 30 MG 24 hr tablet  Commonly known as:  IMDUR     nitrofurantoin (macrocrystal-monohydrate) 100 MG capsule  Commonly known as:  MACROBID      TAKE these medications       albuterol 108 (90 BASE) MCG/ACT inhaler  Commonly known as:  PROVENTIL HFA;VENTOLIN HFA  Inhale 2 puffs into the lungs every 6 (six) hours as needed. For shortness of breath     aspirin 325 MG tablet  Take 325 mg by mouth daily.     B-COMPLEX PO  Take 1 tablet by mouth daily.     calcium carbonate 500 MG chewable tablet  Commonly known as:  TUMS - dosed in mg elemental calcium  Chew 1 tablet by mouth 4 (four) times daily - after meals and at bedtime.     COD LIVER OIL PO  Take 1 tablet by mouth daily.     ferrous sulfate 325 (65 FE) MG tablet  Take 325 mg by mouth daily with breakfast.     fish oil-omega-3 fatty acids 1000 MG capsule  Take 1 g by mouth daily.     loratadine 10 MG tablet  Commonly known as:  CLARITIN  Take 10 mg by mouth daily.     metFORMIN 500 MG tablet  Commonly known as:  GLUCOPHAGE  Take 500 mg by mouth 2 (two) times daily. MAY TAKE 1 1/2 TAB IF NEEDED, PT ADJUSTS THIS MED HIMSELF     multivitamin with minerals Tabs  Take 1 tablet by mouth daily.     nitroGLYCERIN 0.4 MG SL tablet  Commonly known as:  NITROSTAT  Place 1 tablet (0.4 mg  total) under the tongue every 5 (five) minutes x 3 doses as needed for chest pain.     omeprazole 40 MG capsule  Commonly known as:  PRILOSEC  Take 40 mg by mouth daily.     STOOL SOFTENER 100 MG capsule  Generic drug:  Docusate Sodium  Take 200 mg by mouth 2 (two) times daily. Constipation     Ticagrelor 90 MG Tabs tablet  Commonly known as:  BRILINTA  Take 1 tablet (90 mg total) by mouth 2 (two) times daily.     TRADJENTA 5 MG Tabs tablet  Generic drug:  linagliptin  Take 5 mg by mouth daily.       No Known Allergies  The results of significant diagnostics from this hospitalization (including imaging, microbiology, ancillary and laboratory) are listed below for reference.    Significant Diagnostic Studies: Dg Chest Portable 1 View  11/26/2012   *RADIOLOGY REPORT*  Clinical Data: Chest pain, shortness of breath  PORTABLE CHEST - 1 VIEW  Comparison: 11/15/2012  Findings: Prior coronary bypass changes.  Normal heart size and vascularity.  Lungs clear.  No edema or pneumonia.  Negative for effusion  or pneumothorax.  IMPRESSION: Stable postoperative findings.  No superimposed acute process.   Original Report Authenticated By: Judie Petit. Miles Costain, M.D.    Microbiology: Recent Results (from the past 240 hour(s))  URINE CULTURE     Status: None   Collection Time    11/19/12  9:45 AM      Result Value Range Status   Specimen Description URINE, CLEAN CATCH   Final   Special Requests NONE   Final   Culture  Setup Time 11/20/2012 00:32   Final   Colony Count >=100,000 COLONIES/ML   Final   Culture ENTEROCOCCUS SPECIES   Final   Report Status 11/22/2012 FINAL   Final   Organism ID, Bacteria ENTEROCOCCUS SPECIES   Final     Labs: Basic Metabolic Panel:  Recent Labs Lab 11/22/12 1704 11/26/12 1101 11/27/12 0640  NA 132* 132* 134*  K 4.2 4.4 4.1  CL 96 96 100  CO2 22 22 23   GLUCOSE 168* 284* 194*  BUN 14 13 10   CREATININE 0.95 0.90 0.79  CALCIUM 9.3 9.3 9.0   CBC:  Recent  Labs Lab 11/22/12 1704 11/26/12 1101  WBC 8.6 6.8  NEUTROABS 6.1 5.4  HGB 11.4* 10.9*  HCT 34.2* 32.8*  MCV 84.0 85.6  PLT 211 184   Cardiac Enzymes:  Recent Labs Lab 11/22/12 1704 11/26/12 1101 11/26/12 1750 11/26/12 2319 11/27/12 0640  TROPONINI <0.30 <0.30 <0.30 <0.30 <0.30   CBG:  Recent Labs Lab 11/23/12 1814 11/26/12 1624 11/26/12 2132 11/27/12 0727 11/27/12 1124  GLUCAP 126* 234* 231* 185* 254*    Active Problems:   Arteriosclerotic cardiovascular disease (ASCVD)   Diabetes mellitus, type II   Cardiomyopathy, ischemic   Anemia, normocytic normochromic   Time coordinating discharge: 35 minutes  Signed:  Brendia Sacks, MD [facility] 11/27/2012, 3:41 PM

## 2012-11-27 NOTE — Progress Notes (Signed)
[facility] PROGRESS NOTE  Jeffrey [name] ZOX:[id number] DOB: April 07, 1929 DOA: 11/26/2012 PCP: [name], MD  Assessment/Plan: 1. Atypical chest pain: Cardiac enzymes negative. Resolved. No further evaluation suggested. 2. Orthostatic hypotension: Appears stable. Counseled on slow position changes. Compression hose. Coreg discontinued as were other medications that may be contributing. This is a recurrent subacute issue and further hospitalization is not likely be of benefit. Midodrine could be considered as an outpatient. Patient has never fallen. 3. Generalized weakness: No focal deficits. Patient able to stand without assistance. 4. Severe three-vessel coronary artery disease: No new lesions or recurrent stenosis by heart catheterization 4/30. Continue medical management. Note patient not on a beta blocker secondary to symptomatic orthostatic hypotension. 5. Diabetes mellitus type 2: Stable. Resume metformin on discharge. 6. Urinary tract infection diagnosed prior to admission: Complete previously prescribed Levaquin. 7. Frequent ER visits: Review of the record demonstrates patient has been refusing home health.  Patient voluntarily expresses one of his major problems is depression and anxiety which he cannot control. He lives alone and frequently becomes fearful about his health condition. Although he has a daughter nearby he is not feel he can live with her for unclear reasons. He expresses interest in assisted living but by chart review apparently was refusing social work assistance as an outpatient and may be discharged from home health as well. He will need close followup with his primary care physician. I think assisted living would be excellent for him. He appears to have received maximum medical benefit from this hospitalization.  Code Status: Full code DVT prophylaxis: SCDs Family Communication: None present Disposition Plan: Home  Brendia Sacks, MD  [facility]  Pager [phone] If 7PM-7AM, please contact night-coverage at [email or web address], password [facility] 11/27/2012, 3:21 PM  LOS: 1 day   Brief narrative: 79 year old man presented to the emergency department with complaint of chest pain today as well as lightheadedness and generalized weakness. Given his known cardiac disease and demonstrated orthostatic hypotension observation was requested for cardiac rule out and IV fluids.  Consultants:  None  Procedures:  None  HPI/Subjective: Overall feels better. No complaints of chest pain or shortness of breath. Lightheadedness better. Very anxious about living alone. Orthostatics positive.  Objective: Filed Vitals:   11/27/12 1205 11/27/12 1207 11/27/12 1208 11/27/12 1336  BP: 159/77 113/74 109/69 163/74  Pulse: 84 79 89 79  Temp:    98.1 F (36.7 C)  TempSrc:    Oral  Resp:    18  Height:      Weight:      SpO2:    96%    Intake/Output Summary (Last 24 hours) at 11/27/12 1521 Last data filed at 11/27/12 1300  Gross per 24 hour  Intake 1183.33 ml  Output   1025 ml  Net 158.33 ml   Filed Weights   11/26/12 1017 11/26/12 2010  Weight: 77.111 kg (170 lb) 77.4 kg (170 lb 10.2 oz)    Exam:  General: Appears calm and comfortable Cardiovascular: RRR, no m/r/g. No LE edema. Telemetry: SR, no arrhythmias  Respiratory: CTA bilaterally, no w/r/r. Normal respiratory effort. Psychiatric: Somewhat depressed, anxious. Speech fluent and appropriate.  Sits up on side of bed without difficulty. Stands without loss of balance.  Data Reviewed: Basic Metabolic Panel:  Recent Labs Lab 11/22/12 1704 11/26/12 1101 11/27/12 0640  NA 132* 132* 134*  K 4.2 4.4 4.1  CL 96 96 100  CO2 22 22 23   GLUCOSE 168* 284* 194*  BUN 14 13 10  CREATININE 0.95 0.90 0.79  CALCIUM 9.3 9.3 9.0   Liver Function Tests: No results found for this basename: AST, ALT, ALKPHOS, BILITOT, PROT, ALBUMIN,  in the last 168 hours No results found for this  basename: LIPASE, AMYLASE,  in the last 168 hours No results found for this basename: AMMONIA,  in the last 168 hours CBC:  Recent Labs Lab 11/22/12 1704 11/26/12 1101  WBC 8.6 6.8  NEUTROABS 6.1 5.4  HGB 11.4* 10.9*  HCT 34.2* 32.8*  MCV 84.0 85.6  PLT 211 184   Cardiac Enzymes:  Recent Labs Lab 11/22/12 1704 11/26/12 1101 11/26/12 1750 11/26/12 2319 11/27/12 0640  TROPONINI <0.30 <0.30 <0.30 <0.30 <0.30   BNP (last 3 results)  Recent Labs  04/17/12 1521 07/29/12 1340 07/29/12 1855  PROBNP 438.7 863.6* 845.2*   CBG:  Recent Labs Lab 11/23/12 1814 11/26/12 1624 11/26/12 2132 11/27/12 0727 11/27/12 1124  GLUCAP 126* 234* 231* 185* 254*    Recent Results (from the past 240 hour(s))  URINE CULTURE     Status: None   Collection Time    11/19/12  9:45 AM      Result Value Range Status   Specimen Description URINE, CLEAN CATCH   Final   Special Requests NONE   Final   Culture  Setup Time 11/20/2012 00:32   Final   Colony Count >=100,000 COLONIES/ML   Final   Culture ENTEROCOCCUS SPECIES   Final   Report Status 11/22/2012 FINAL   Final   Organism ID, Bacteria ENTEROCOCCUS SPECIES   Final     Studies: Dg Chest Portable 1 View  11/26/2012   *RADIOLOGY REPORT*  Clinical Data: Chest pain, shortness of breath  PORTABLE CHEST - 1 VIEW  Comparison: 11/15/2012  Findings: Prior coronary bypass changes.  Normal heart size and vascularity.  Lungs clear.  No edema or pneumonia.  Negative for effusion or pneumothorax.  IMPRESSION: Stable postoperative findings.  No superimposed acute process.   Original Report Authenticated By: Judie Petit. Shick, M.D.    Scheduled Meds: . aspirin  325 mg Oral Daily  . buPROPion  100 mg Oral TID  . calcium carbonate  1 tablet Oral TID PC & HS  . carvedilol  6.25 mg Oral BID WC  . docusate sodium  100 mg Oral BID  . ferrous sulfate  325 mg Oral Q breakfast  . levofloxacin  250 mg Oral Daily  . loratadine  10 mg Oral Daily  . pantoprazole   40 mg Oral Daily  . sodium chloride  3 mL Intravenous Q12H  . Ticagrelor  90 mg Oral BID   Continuous Infusions: . sodium chloride 50 mL/hr at 11/27/12 1301    Active Problems:   Arteriosclerotic cardiovascular disease (ASCVD)   Diabetes mellitus, type II   Cardiomyopathy, ischemic   Anemia, normocytic normochromic     Brendia Sacks, MD  [facility] Pager [phone] If 7PM-7AM, please contact night-coverage at [email or web address], password [facility] 11/27/2012, 3:21 PM  LOS: 1 day

## 2012-11-28 ENCOUNTER — Emergency Department (HOSPITAL_COMMUNITY)
Admission: EM | Admit: 2012-11-28 | Discharge: 2012-11-28 | Disposition: A | Discharge status: Home / Self Care | Payer: Medicare Other | Attending: Emergency Medicine | Admitting: Emergency Medicine

## 2012-11-28 ENCOUNTER — Encounter (HOSPITAL_COMMUNITY): Payer: Self-pay | Admitting: *Deleted

## 2012-11-28 ENCOUNTER — Emergency Department (HOSPITAL_COMMUNITY): Payer: Medicare Other

## 2012-11-28 DIAGNOSIS — Z9861 Coronary angioplasty status: Secondary | ICD-10-CM | POA: Insufficient documentation

## 2012-11-28 DIAGNOSIS — Z8701 Personal history of pneumonia (recurrent): Secondary | ICD-10-CM | POA: Insufficient documentation

## 2012-11-28 DIAGNOSIS — D649 Anemia, unspecified: Secondary | ICD-10-CM | POA: Insufficient documentation

## 2012-11-28 DIAGNOSIS — K219 Gastro-esophageal reflux disease without esophagitis: Secondary | ICD-10-CM | POA: Insufficient documentation

## 2012-11-28 DIAGNOSIS — Z79899 Other long term (current) drug therapy: Secondary | ICD-10-CM | POA: Insufficient documentation

## 2012-11-28 DIAGNOSIS — I10 Essential (primary) hypertension: Secondary | ICD-10-CM | POA: Insufficient documentation

## 2012-11-28 DIAGNOSIS — Z8711 Personal history of peptic ulcer disease: Secondary | ICD-10-CM | POA: Insufficient documentation

## 2012-11-28 DIAGNOSIS — Z8673 Personal history of transient ischemic attack (TIA), and cerebral infarction without residual deficits: Secondary | ICD-10-CM | POA: Insufficient documentation

## 2012-11-28 DIAGNOSIS — Z862 Personal history of diseases of the blood and blood-forming organs and certain disorders involving the immune mechanism: Secondary | ICD-10-CM | POA: Insufficient documentation

## 2012-11-28 DIAGNOSIS — Z951 Presence of aortocoronary bypass graft: Secondary | ICD-10-CM | POA: Insufficient documentation

## 2012-11-28 DIAGNOSIS — Z87891 Personal history of nicotine dependence: Secondary | ICD-10-CM | POA: Insufficient documentation

## 2012-11-28 DIAGNOSIS — R5383 Other fatigue: Secondary | ICD-10-CM | POA: Insufficient documentation

## 2012-11-28 DIAGNOSIS — Z8739 Personal history of other diseases of the musculoskeletal system and connective tissue: Secondary | ICD-10-CM | POA: Insufficient documentation

## 2012-11-28 DIAGNOSIS — K5900 Constipation, unspecified: Secondary | ICD-10-CM | POA: Insufficient documentation

## 2012-11-28 DIAGNOSIS — Z8781 Personal history of (healed) traumatic fracture: Secondary | ICD-10-CM | POA: Insufficient documentation

## 2012-11-28 DIAGNOSIS — R5381 Other malaise: Secondary | ICD-10-CM | POA: Insufficient documentation

## 2012-11-28 DIAGNOSIS — E119 Type 2 diabetes mellitus without complications: Secondary | ICD-10-CM | POA: Insufficient documentation

## 2012-11-28 DIAGNOSIS — Z7982 Long term (current) use of aspirin: Secondary | ICD-10-CM | POA: Insufficient documentation

## 2012-11-28 DIAGNOSIS — Z8639 Personal history of other endocrine, nutritional and metabolic disease: Secondary | ICD-10-CM | POA: Insufficient documentation

## 2012-11-28 DIAGNOSIS — Z8679 Personal history of other diseases of the circulatory system: Secondary | ICD-10-CM | POA: Insufficient documentation

## 2012-11-28 DIAGNOSIS — N400 Enlarged prostate without lower urinary tract symptoms: Secondary | ICD-10-CM | POA: Insufficient documentation

## 2012-11-28 LAB — URINALYSIS, ROUTINE W REFLEX MICROSCOPIC
Leukocytes, UA: NEGATIVE
Nitrite: NEGATIVE
Specific Gravity, Urine: >1.03 — ABNORMAL HIGH (ref 1.005–1.030)
Urobilinogen, UA: 0.2 mg/dL (ref 0.0–1.0)
pH: 6 (ref 5.0–8.0)

## 2012-11-28 LAB — CBC WITH DIFFERENTIAL/PLATELET
Eosinophils Relative: 1 % (ref 0–5)
HCT: 34.9 % — ABNORMAL LOW (ref 39.0–52.0)
Lymphocytes Relative: 12 % (ref 12–46)
Lymphs Abs: 0.9 10*3/uL (ref 0.7–4.0)
MCV: 85.7 fL (ref 78.0–100.0)
Platelets: 198 10*3/uL (ref 150–400)
RBC: 4.07 MIL/uL — ABNORMAL LOW (ref 4.22–5.81)
WBC: 7.7 10*3/uL (ref 4.0–10.5)

## 2012-11-28 NOTE — ED Notes (Addendum)
Pt recently admitted to hospital, discharged Friday, states that he has been feeling weak, dizziness with standing. Pt states that he has not felt good since having stents placed about 4 weeks ago, denies any pain but reports indigestion. States "i have it all the time"

## 2012-11-28 NOTE — ED Provider Notes (Signed)
History  This chart was scribed for [name], MD by Bennett Scrape, ED Scribe. This patient was seen in room APA18/AP[facility]18 and the patient's care was started at 1:07 PM.  CSN: [id number]  Arrival date & time 11/28/12  1242   First MD Initiated Contact with Patient 11/28/12 1307      Chief Complaint  Patient presents with  . Fatigue     The history is provided by the patient. No language interpreter was used.    HPI Comments: Jeffrey [name] is a 79 y.o. male who presents to the Emergency Department complaining of fatigue with associated diffuse CP that started today. He also expresses concerns over orthostatic hypotension. He was admitted for CP 2 days ago with no concerning lab or radiology findings. He has been seen by Dr. Irene Limbo during his admission and his anxiety was addressed. Pt states that he called [facility] to try to set up a treatment plan for his anxiety but states that they were closed. He has a h/o stent placement last year with a 30 day [facility] stay for rehab. Pt reports that the symptoms are the same as last year but admits that he has been able to ambulate without assistance.  He denies cough, SOB, abdominal pain. He reports recent constipation. He is currently on Levaquin for kidney infection. Pt states that he uses inhaler at home. He has nurses that come by once a week. Seen by Dr. Sherwood Gambler last week and got so anxious he had to sit out in the lobby to calm down.   Past Medical History  Diagnosis Date  . Diabetes mellitus, type II   . Hyperlipidemia     Lipid profile in 02/2012:135, 227, 41, 49  . Arteriosclerotic cardiovascular disease (ASCVD)     a. CABG x 4 in 1989 (VG->OM1->OM2, VG->RCA, LIMA->LAD), b. 05/2010: DES to VG-OM1/OM2, DES to distal LCx. c. NSTEMI in 04/2011 - TO distal LCX stent and VG->OM2. d. 02/2012 NSTEMI DES to VG-OM1/continuation to OM2 occluded. e. inferior STEMI s/p DES to SVG-RAMUS 06/2012. f. inferolat STEMI 09/2012 s/p DES to SVG-interm.  .  Hypertension   . Osteoarthritis   . CVA (cerebral infarction)     details unclear. pt reports light stroke last year involving [name] leg. No imaging to suggest hemorrhagic etiology  . Pneumonia   . Cervical vertebral fracture   . Chronic back pain   . Benign prostatic hypertrophy     Required urinary catheter x4 weeks for retention  . Peptic ulcer disease   . Gastroesophageal reflux disease   . Ischemic cardiomyopathy     EF 45-50% 3/32014  . DM (diabetes mellitus)   . Anemia     Past Surgical History  Procedure Laterality Date  . Coronary angioplasty with stent placement      2011, 02/2012  . Tonsillectomy    . Coronary artery bypass graft      Family History  Problem Relation Age of Onset  . Early death      Parents died young  . Appendicitis Mother     Pt was 4 year old  . Heart attack Father 48    History  Substance Use Topics  . Smoking status: Former Smoker -- 2.00 packs/day for 10 years    Types: Cigarettes    Quit date: 07/14/1961  . Smokeless tobacco: Current User    Types: Chew  . Alcohol Use: No      Review of Systems  All other systems reviewed and  are negative.    Allergies  Review of patient's allergies indicates no known allergies.  Home Medications   Current Outpatient Rx  Name  Route  Sig  Dispense  Refill  . albuterol (PROVENTIL HFA;VENTOLIN HFA) 108 (90 BASE) MCG/ACT inhaler   Inhalation   Inhale 2 puffs into the lungs every 6 (six) hours as needed. For shortness of breath         . aspirin 325 MG tablet   Oral   Take 325 mg by mouth daily.         . B Complex-Biotin-FA (B-COMPLEX PO)   Oral   Take 1 tablet by mouth daily.          . calcium carbonate (TUMS - DOSED IN MG ELEMENTAL CALCIUM) 500 MG chewable tablet   Oral   Chew 1 tablet by mouth 4 (four) times daily - after meals and at bedtime.         . COD LIVER OIL PO   Oral   Take 1 tablet by mouth daily.         Tery Sanfilippo Sodium (STOOL SOFTENER) 100 MG  capsule   Oral   Take 200 mg by mouth 2 (two) times daily. Constipation         . ferrous sulfate 325 (65 FE) MG tablet   Oral   Take 325 mg by mouth daily with breakfast.         . fish oil-omega-3 fatty acids 1000 MG capsule   Oral   Take 1 g by mouth daily.         Marland Kitchen linagliptin (TRADJENTA) 5 MG TABS tablet   Oral   Take 5 mg by mouth daily.         Marland Kitchen loratadine (CLARITIN) 10 MG tablet   Oral   Take 10 mg by mouth daily.         . metFORMIN (GLUCOPHAGE) 500 MG tablet   Oral   Take 500 mg by mouth 2 (two) times daily. MAY TAKE 1 1/2 TAB IF NEEDED, PT ADJUSTS THIS MED HIMSELF         . Multiple Vitamin (MULTIVITAMIN WITH MINERALS) TABS   Oral   Take 1 tablet by mouth daily.         . nitroGLYCERIN (NITROSTAT) 0.4 MG SL tablet   Sublingual   Place 1 tablet (0.4 mg total) under the tongue every 5 (five) minutes x 3 doses as needed for chest pain.   25 tablet   3   . omeprazole (PRILOSEC) 40 MG capsule   Oral   Take 40 mg by mouth daily.         . Ticagrelor (BRILINTA) 90 MG TABS tablet   Oral   Take 1 tablet (90 mg total) by mouth 2 (two) times daily.   60 tablet   6     Triage Vitals: BP 101/54  Pulse 104  Temp(Src) 97 F (36.1 C) (Oral)  Resp 18  Ht 5\' 10"  (1.778 m)  Wt 170 lb (77.111 kg)  BMI 24.39 kg/m2  SpO2 97%  Physical Exam  Nursing note and vitals reviewed. Constitutional: He is oriented to person, place, and time. He appears well-developed and well-nourished.  HENT:  Head: Normocephalic and atraumatic.  Right Ear: External ear normal.  Left Ear: External ear normal.  Dry MM  Eyes: Conjunctivae and EOM are normal. Pupils are equal, round, and reactive to light.  Neck: Normal range of motion and phonation normal.  Neck supple.  Cardiovascular: Normal rate, regular rhythm, normal heart sounds and intact distal pulses.   Pulmonary/Chest: Effort normal and breath sounds normal. He exhibits no bony tenderness.  Abdominal: Soft.  Normal appearance and bowel sounds are normal. There is no tenderness.  Genitourinary:  Normal anus. Moderate amt. Black stool in rectum, no impaction.  Musculoskeletal: Normal range of motion. He exhibits no edema.  Neurological: He is alert and oriented to person, place, and time. He has normal strength. No cranial nerve deficit or sensory deficit. He exhibits normal muscle tone. Coordination normal.  Skin: Skin is warm, dry and intact.  Psychiatric: He has a normal mood and affect. His behavior is normal. Judgment and thought content normal.    ED Course  Procedures (including critical care time)  Patient Vitals for the past 24 hrs:  BP Temp Temp src Pulse Resp SpO2 Height Weight  11/28/12 1709 134/75 mmHg - - 92 16 - - -  11/28/12 1500 141/73 mmHg - - - 15 100 % - -  11/28/12 1400 - - - - 17 - - -  11/28/12 1309 101/54 mmHg - - 104 - - - -  11/28/12 1307 119/63 mmHg - - 100 - - - -  11/28/12 1306 151/72 mmHg - - 98 - - - -  11/28/12 1246 140/58 mmHg 97 F (36.1 C) Oral 97 18 97 % 5\' 10"  (1.778 m) 170 lb (77.111 kg)       Date: 11/28/12  Rate: 108  Rhythm: sinus tachycardia and premature ventricular contractions (PVC)  QRS Axis: normal  PR and QT Intervals: normal  ST/T Wave abnormalities: nonspecific T wave changes  PR and QRS Conduction Disutrbances:none  Narrative Interpretation:   Old EKG Reviewed: unchanged- 11/26/12  4:13 PM Reevaluation with update and discussion. After initial assessment and treatment, an updated evaluation reveals he is tolerating oral fluids, and states he is hungry. He, states that he wants to go into rehabilitation, for 30 days to get his strength better. Jeffrey [name]   4:45 PM-Consult complete with Dr. Irene Limbo. Patient case explained and discussed. He feels that the patient would not be a candidate for Rehab. He continues to believe that he needs Assisted living. We both feel that the  Patient should see his PCP to arrange placement and for  further evaluation and treatment. Call ended at 4:50 PM   5:08 PM Reevaluation with update and discussion. After initial assessment and treatment, an updated evaluation reveals the patient's daughter, is here now. She is able to assist him at home, somewhat, but her inability to drive makes it hard to get to his home. I discussed the findings with her, and the importance of him seeing his primary care doctor. The daughter understands, as does the patient. He'll see his doctor tomorrow to see about getting into a rehabilitation facility, or an assisted living facility.Jeffrey [name]      DIAGNOSTIC STUDIES: Oxygen Saturation is 97% on room air, normal by my interpretation.    COORDINATION OF CARE: 1:29 PM-Discussed treatment plan which includes xray of abdomen, CBC panel, CMP, UA and troponin with pt at bedside and pt agreed to plan.   Labs Reviewed  CBC WITH DIFFERENTIAL - Abnormal; Notable for the following:    RBC 4.07 (*)    Hemoglobin 11.4 (*)    HCT 34.9 (*)    Neutrophils Relative % 78 (*)    All other components within normal limits  URINALYSIS, ROUTINE W REFLEX MICROSCOPIC - Abnormal;  Notable for the following:    Specific Gravity, Urine >1.030 (*)    Glucose, UA 250 (*)    Ketones, ur TRACE (*)    All other components within normal limits  URINE CULTURE  POCT I-STAT TROPONIN I   Dg Abd 1 View  11/28/2012   *RADIOLOGY REPORT*  Clinical Data: Abdominal pain on the left  ABDOMEN - 1 VIEW  Comparison: Acute abdominal series 08/05/2012  Findings: 280 upper part of the abdomen is not completely included on image.  Visualized bowel gas pattern is nonobstructive.  There is a moderate amount of stool in the colon.  No radiopaque urinary tract calculus is identified.  Stable probable vascular calcification of the left pelvis.  Mild convex left scoliosis of the lumbar spine with multilevel degenerative changes.  IMPRESSION: Moderate amount of stool in the colon.  Question if the  patient could be constipated. Nonobstructive bowel gas pattern.   Original Report Authenticated By: Britta Mccreedy, M.D.     1. Malaise   2. Constipation       MDM  Non-specific weakness, and malaise. Doubt UTI. Metabolic instability. ACS, PE, or pneumonia. The symptoms are recurrent, and on-going. He was discharged from the hospital yesterday with the same symptoms. This admission followed several ED visits with similar complaints. He had also been diagnosed with a UTI and is still on Levaquin. Urinalysis today does not appear infected. Doubt metabolic instability, serious bacterial infection or impending vascular collapse; the patient is stable for discharge.  Nursing Notes Reviewed/ Care Coordinated, and agree without changes. Applicable Imaging Reviewed.  Interpretation of Laboratory Data incorporated into ED treatment   Plan: Home Medications- usual; Home Treatments- rest, increase fluids; Recommended follow up- PCP tomorrow morning for evaluation and recommendation for placement    I personally performed the services described in this documentation, which was scribed in my presence. The recorded information has been reviewed and is accurate.      [name], MD 11/28/12 [phone]

## 2012-11-29 ENCOUNTER — Emergency Department (HOSPITAL_COMMUNITY)
Admission: EM | Admit: 2012-11-29 | Discharge: 2012-11-29 | Disposition: A | Discharge status: Home / Self Care | Payer: Medicare Other | Attending: Emergency Medicine | Admitting: Emergency Medicine

## 2012-11-29 ENCOUNTER — Telehealth: Payer: Self-pay | Admitting: Adult Health

## 2012-11-29 ENCOUNTER — Encounter (HOSPITAL_COMMUNITY): Payer: Self-pay | Admitting: *Deleted

## 2012-11-29 DIAGNOSIS — K219 Gastro-esophageal reflux disease without esophagitis: Secondary | ICD-10-CM | POA: Insufficient documentation

## 2012-11-29 DIAGNOSIS — E119 Type 2 diabetes mellitus without complications: Secondary | ICD-10-CM | POA: Insufficient documentation

## 2012-11-29 DIAGNOSIS — Z8673 Personal history of transient ischemic attack (TIA), and cerebral infarction without residual deficits: Secondary | ICD-10-CM | POA: Insufficient documentation

## 2012-11-29 DIAGNOSIS — Z87448 Personal history of other diseases of urinary system: Secondary | ICD-10-CM | POA: Insufficient documentation

## 2012-11-29 DIAGNOSIS — M542 Cervicalgia: Secondary | ICD-10-CM | POA: Insufficient documentation

## 2012-11-29 DIAGNOSIS — Z79899 Other long term (current) drug therapy: Secondary | ICD-10-CM | POA: Insufficient documentation

## 2012-11-29 DIAGNOSIS — Z7982 Long term (current) use of aspirin: Secondary | ICD-10-CM | POA: Insufficient documentation

## 2012-11-29 DIAGNOSIS — I951 Orthostatic hypotension: Secondary | ICD-10-CM | POA: Insufficient documentation

## 2012-11-29 DIAGNOSIS — G8929 Other chronic pain: Secondary | ICD-10-CM | POA: Insufficient documentation

## 2012-11-29 DIAGNOSIS — D649 Anemia, unspecified: Secondary | ICD-10-CM | POA: Insufficient documentation

## 2012-11-29 DIAGNOSIS — Z8639 Personal history of other endocrine, nutritional and metabolic disease: Secondary | ICD-10-CM | POA: Insufficient documentation

## 2012-11-29 DIAGNOSIS — Z8701 Personal history of pneumonia (recurrent): Secondary | ICD-10-CM | POA: Insufficient documentation

## 2012-11-29 DIAGNOSIS — Z8679 Personal history of other diseases of the circulatory system: Secondary | ICD-10-CM | POA: Insufficient documentation

## 2012-11-29 DIAGNOSIS — Z862 Personal history of diseases of the blood and blood-forming organs and certain disorders involving the immune mechanism: Secondary | ICD-10-CM | POA: Insufficient documentation

## 2012-11-29 DIAGNOSIS — Z8781 Personal history of (healed) traumatic fracture: Secondary | ICD-10-CM | POA: Insufficient documentation

## 2012-11-29 DIAGNOSIS — R5383 Other fatigue: Secondary | ICD-10-CM

## 2012-11-29 DIAGNOSIS — R5381 Other malaise: Secondary | ICD-10-CM | POA: Insufficient documentation

## 2012-11-29 DIAGNOSIS — Z87891 Personal history of nicotine dependence: Secondary | ICD-10-CM | POA: Insufficient documentation

## 2012-11-29 DIAGNOSIS — R0602 Shortness of breath: Secondary | ICD-10-CM | POA: Insufficient documentation

## 2012-11-29 DIAGNOSIS — Z8711 Personal history of peptic ulcer disease: Secondary | ICD-10-CM | POA: Insufficient documentation

## 2012-11-29 DIAGNOSIS — I10 Essential (primary) hypertension: Secondary | ICD-10-CM | POA: Insufficient documentation

## 2012-11-29 LAB — COMPREHENSIVE METABOLIC PANEL
ALT: 19 U/L (ref 0–53)
Alkaline Phosphatase: 98 U/L (ref 39–117)
BUN: 14 mg/dL (ref 6–23)
CO2: 22 mEq/L (ref 19–32)
GFR calc Af Amer: 75 mL/min — ABNORMAL LOW (ref >90)
GFR calc non Af Amer: 64 mL/min — ABNORMAL LOW (ref >90)
Glucose, Bld: 216 mg/dL — ABNORMAL HIGH (ref 70–99)
Potassium: 4.4 mEq/L (ref 3.5–5.1)
Sodium: 134 mEq/L — ABNORMAL LOW (ref 135–145)
Total Protein: 7.8 g/dL (ref 6.0–8.3)

## 2012-11-29 LAB — CBC WITH DIFFERENTIAL/PLATELET
Eosinophils Absolute: 0.2 10*3/uL (ref 0.0–0.7)
Eosinophils Relative: 2 % (ref 0–5)
Hemoglobin: 12.7 g/dL — ABNORMAL LOW (ref 13.0–17.0)
Lymphocytes Relative: 11 % — ABNORMAL LOW (ref 12–46)
Lymphs Abs: 0.9 10*3/uL (ref 0.7–4.0)
MCH: 27.7 pg (ref 26.0–34.0)
MCV: 85.4 fL (ref 78.0–100.0)
Monocytes Relative: 10 % (ref 3–12)
Platelets: 219 10*3/uL (ref 150–400)
RBC: 4.58 MIL/uL (ref 4.22–5.81)
WBC: 8.3 10*3/uL (ref 4.0–10.5)

## 2012-11-29 LAB — TROPONIN I: Troponin I: <0.3 ng/mL (ref <0.30)

## 2012-11-29 LAB — URINE CULTURE

## 2012-11-29 MED ORDER — SODIUM CHLORIDE 0.9 % IV SOLN: INTRAVENOUS | Status: DC

## 2012-11-29 MED ORDER — SODIUM CHLORIDE 0.9 % IV BOLUS (SEPSIS)
250.0000 mL | Freq: Once | INTRAVENOUS | Status: AC
  Administered 2012-11-29: 250 mL via INTRAVENOUS

## 2012-11-29 NOTE — Care Management Note (Signed)
    Page 1 of 2   11/29/2012     3:28:15 PM   CARE MANAGEMENT NOTE 11/29/2012  Patient:  Jeffrey [name], Jeffrey [name]   Account Number:  [id number]  Date Initiated:  11/29/2012  Documentation initiated by:  Sharrie Rothman  Subjective/Objective Assessment:   CM called by CSW to ED for pt needing HH. Pt does not qualify for SNF and pt refuses ALF. HH RN, PT, and CSW arranged with [facility] (pt already active with agency). Pt has cane, lives alone. has daughter who is able to provide limited help.     Action/Plan:   Alroy Bailiff of [facility] is aware of HH needs. Pt has no other CM needs. Pt did state that he is on waiting list for [facility] in [place].   Anticipated DC Date:  11/29/2012   Anticipated DC Plan:  HOME W HOME HEALTH SERVICES  In-house referral  Clinical Social Worker      DC Planning Services  CM consult      [facility] Choice  HOME HEALTH   Choice offered to / List presented to:  C-1 Patient        HH arranged  HH-1 RN  HH-2 PT  HH-6 SOCIAL WORKER      HH agency  [facility]   Status of service:  Completed, signed off Medicare Important Message given?   (If response is "NO", the following Medicare IM given date fields will be blank) Date Medicare IM given:   Date Additional Medicare IM given:    Discharge Disposition:  HOME W HOME HEALTH SERVICES  Per UR Regulation:    If discussed at Long Length of Stay Meetings, dates discussed:    Comments:  11/29/12 1530 Arlyss Queen, RN BSN CM

## 2012-11-29 NOTE — Telephone Encounter (Signed)
Spoke to pt to advise results/instructions. Pt understood. Pt to have sw/nurse come out to the home to review his medications and needs, advised to have the nurse call our office with any further questions, pt understood all instrucitons

## 2012-11-29 NOTE — ED Notes (Signed)
Orthostatic vital signs were completed on pt. When pt went from sitting to standing pt became dizzy and weak. Pt also became hypotensive. Pt back in bed now and placed on cardiac monitor.

## 2012-11-29 NOTE — Telephone Encounter (Signed)
Patient's family member calling to let us know the patient has been to the ER the last x 3 days.  He is concerned about his weakness, strength, and has experienced weakness since the insertion of his cardiac stent x 1 month ago.  Please return a call to the patient at [phone]

## 2012-11-29 NOTE — Telephone Encounter (Signed)
I have reviewed the ER notes, labs and medications. Do not think weakness it cardiac related. He was taken off of imdur and zoloft by Dr.Rothbart on visit on 5.14.2014. Abrupt cessation of antidepressant without weaning can cause some of the symptoms. See PCP for need to start back on antidepressant if symptoms are related to depression.

## 2012-11-29 NOTE — Clinical Social Work Psychosocial (Signed)
Clinical Social Work Department BRIEF PSYCHOSOCIAL ASSESSMENT 11/29/2012  Patient:  Jeffrey [name], Jeffrey [name]     Account Number:  [id number]     Admit date:  11/29/2012  Clinical Social Worker:  Nancie Neas  Date/Time:  11/29/2012 01:15 PM  Referred by:  Physician  Date Referred:  11/29/2012 Referred for  ALF Placement   Other Referral:   Interview type:  Patient Other interview type:    PSYCHOSOCIAL DATA Living Status:  ALONE Admitted from facility:   Level of care:   Primary support name:   Primary support relationship to patient:  CHILD, ADULT Degree of support available:   limited    CURRENT CONCERNS Current Concerns  Post-Acute Placement   Other Concerns:    SOCIAL WORK ASSESSMENT / PLAN CSW met with pt at bedside in ED following referral from EDP for placement. Pt alert and oriented and reports he lives alone. He has a daughter nearby but she is limited in her ability to assist pt due to multiple eye surgeries. Pt is still driving and drove himself today to his MD appointment with Dr. Sherwood Gambler. Pt's blood pressure was very low at office and pt came to ED by EMS. Pt has multiple ED visits and hospital admissions recently. He reports he lives alone and ambulates with a cane. Pt wants to go to [facility]. CSW explained that pt does not have a skilled need. CSW reviewed notes from previous hospitalizations. Pt was seen by PT earlier this month and ambulated 225' with rolling walker and stated he did fine with or without the walker. Pt explained to CSW that he has felt weaker recently, yet had no problems ambulating today into MD office and per RN report transferred off stretcher in ED independently. CSW offered pt ALF and provided ALF list. CSW explained payment for ALF. He continued to insist that he needed to go to [facility] like he did for a month in the past. Pt at first said he was too weak to even do home health. Then he said he walks fine without a cane. When CSW explained that at that  level, pt would do fine at ALF, he said but I am flat on my back right now and can't do anything for myself. Pt was sitting at the edge of the bed feeding himself. CSW attempted to discuss ALF again with pt and he once again insisted that he needed to go to [facility]. CSW discussed with EDP who is agreeable to home health orders.   Assessment/plan status:  Referral to [facility] Other assessment/ plan:   Information/referral to community resources:   ALF list    PATIENT'S/FAMILY'S RESPONSE TO PLAN OF CARE: Pt refuses to consider any option outside of [facility] despite being informed numerous times that it appeared he was functioing at too high a level for SNF. CSW notified CM to follow up with home health and will sign off.       Derenda Fennel, [place] [phone]

## 2012-11-29 NOTE — ED Provider Notes (Signed)
History     This chart was scribed for Jeffrey Jakes, MD, MD by Smitty Pluck, ED Scribe. The patient was seen in room APA11/APA11 and the patient's care was started at 9:05 AM.   CSN: [id number]  Arrival date & time 11/29/12  3086      Chief Complaint  Patient presents with  . Weakness   Patient is a 79 y.o. male presenting with weakness. The history is provided by the patient and medical records. No language interpreter was used.  Weakness This is a chronic problem. The current episode started more than 1 week ago. The problem occurs constantly. The problem has not changed since onset.Associated symptoms include shortness of breath. Nothing aggravates the symptoms. Nothing relieves the symptoms.   HPI Comments: Jeffrey [name] is a 79 y.o. male who presents to the Emergency Department referred her from Dr. Sharyon Medicus office and BIB EMS for constant, moderate weakness that has been ongoing for the past 3 weeks. Pt was seen here 1 day ago for weakness and abdominal pain with nl abdominal xray and nl UA. He reports having increased weakness and hypotension today. Staff at [facility] office report pts BP was 80/40 in office (currently 140/67 in ED). He reports having SOB and neck pain. Pt denies syncope, fever, chills, nausea, vomiting, diarrhea, weakness, cough and any other pain. Pt reports that he is trying to be placed into rehab due to constant weakness. Pt has hx of fatigue and intermittent, chest pain. He was admitted for CP on 11/26/12 and discharged within 24 hours.     Past Medical History  Diagnosis Date  . Diabetes mellitus, type II   . Hyperlipidemia     Lipid profile in 02/2012:135, 227, 41, 49  . Arteriosclerotic cardiovascular disease (ASCVD)     a. CABG x 4 in 1989 (VG->OM1->OM2, VG->RCA, LIMA->LAD), b. 05/2010: DES to VG-OM1/OM2, DES to distal LCx. c. NSTEMI in 04/2011 - TO distal LCX stent and VG->OM2. d. 02/2012 NSTEMI DES to VG-OM1/continuation to OM2 occluded. e. inferior  STEMI s/p DES to SVG-RAMUS 06/2012. f. inferolat STEMI 09/2012 s/p DES to SVG-interm.  . Hypertension   . Osteoarthritis   . CVA (cerebral infarction)     details unclear. pt reports light stroke last year involving L leg. No imaging to suggest hemorrhagic etiology  . Pneumonia   . Cervical vertebral fracture   . Chronic back pain   . Benign prostatic hypertrophy     Required urinary catheter x4 weeks for retention  . Peptic ulcer disease   . Gastroesophageal reflux disease   . Ischemic cardiomyopathy     EF 45-50% 3/32014  . DM (diabetes mellitus)   . Anemia     Past Surgical History  Procedure Laterality Date  . Coronary angioplasty with stent placement      2011, 02/2012  . Tonsillectomy    . Coronary artery bypass graft      Family History  Problem Relation Age of Onset  . Early death      Parents died young  . Appendicitis Mother     Pt was 37 year old  . Heart attack Father 50    History  Substance Use Topics  . Smoking status: Former Smoker -- 2.00 packs/day for 10 years    Types: Cigarettes    Quit date: 07/14/1961  . Smokeless tobacco: Current User    Types: Chew  . Alcohol Use: No      Review of Systems  HENT: Positive for  neck pain.   Respiratory: Positive for shortness of breath.   Neurological: Positive for weakness.    Allergies  Review of patient's allergies indicates no known allergies.  Home Medications   Current Outpatient Rx  Name  Route  Sig  Dispense  Refill  . albuterol (PROVENTIL HFA;VENTOLIN HFA) 108 (90 BASE) MCG/ACT inhaler   Inhalation   Inhale 2 puffs into the lungs every 6 (six) hours as needed. For shortness of breath         . aspirin 325 MG tablet   Oral   Take 325 mg by mouth daily.         . B Complex-Biotin-FA (B-COMPLEX PO)   Oral   Take 1 tablet by mouth daily.          Marland Kitchen buPROPion (WELLBUTRIN SR) 100 MG 12 hr tablet   Oral   Take 100 mg by mouth daily.         . calcium carbonate (TUMS - DOSED IN MG  ELEMENTAL CALCIUM) 500 MG chewable tablet   Oral   Chew 1 tablet by mouth 4 (four) times daily - after meals and at bedtime.         . COD LIVER OIL PO   Oral   Take 1 tablet by mouth daily.         Tery Sanfilippo Sodium (STOOL SOFTENER) 100 MG capsule   Oral   Take 200 mg by mouth 2 (two) times daily. Constipation         . ferrous sulfate 325 (65 FE) MG tablet   Oral   Take 325 mg by mouth daily with breakfast.         . fish oil-omega-3 fatty acids 1000 MG capsule   Oral   Take 1 g by mouth daily.         . isosorbide mononitrate (IMDUR) 30 MG 24 hr tablet   Oral   Take 15 mg by mouth daily.         Marland Kitchen linagliptin (TRADJENTA) 5 MG TABS tablet   Oral   Take 5 mg by mouth daily.         Marland Kitchen loratadine (CLARITIN) 10 MG tablet   Oral   Take 10 mg by mouth daily.         . metFORMIN (GLUCOPHAGE) 500 MG tablet   Oral   Take 500 mg by mouth 2 (two) times daily. MAY TAKE 1 1/2 TAB IF NEEDED, PT ADJUSTS THIS MED HIMSELF         . Multiple Vitamin (MULTIVITAMIN WITH MINERALS) TABS   Oral   Take 1 tablet by mouth daily.         Marland Kitchen omeprazole (PRILOSEC) 40 MG capsule   Oral   Take 40 mg by mouth daily.         . Ticagrelor (BRILINTA) 90 MG TABS tablet   Oral   Take 1 tablet (90 mg total) by mouth 2 (two) times daily.   60 tablet   6   . nitroGLYCERIN (NITROSTAT) 0.4 MG SL tablet   Sublingual   Place 1 tablet (0.4 mg total) under the tongue every 5 (five) minutes x 3 doses as needed for chest pain.   25 tablet   3     BP 133/80  Pulse 88  Temp(Src) 97.6 F (36.4 C) (Oral)  Resp 20  SpO2 98%  Physical Exam  Nursing note and vitals reviewed. Constitutional: He is oriented to person, place, and  time. He appears well-developed and well-nourished. No distress.  HENT:  Head: Normocephalic and atraumatic.  Mouth/Throat: Oropharynx is clear and moist.  Eyes: Conjunctivae are normal.  Neck: Normal range of motion. Neck supple.  Cardiovascular:  Normal rate, regular rhythm and normal heart sounds.  Exam reveals no gallop and no friction rub.   No murmur heard. BP 140/67   Pulmonary/Chest: Effort normal and breath sounds normal. No respiratory distress. He has no wheezes. He has no rales.  Abdominal: Soft. Bowel sounds are normal. He exhibits no distension. There is no tenderness. There is no rebound and no guarding.  Musculoskeletal: Normal range of motion. He exhibits no edema.  Neurological: He is alert and oriented to person, place, and time. No cranial nerve deficit.  Skin: Skin is warm and dry.  Psychiatric: He has a normal mood and affect. His behavior is normal.    ED Course  Procedures (including critical care time) DIAGNOSTIC STUDIES: Oxygen Saturation is 98% on room air, normal by my interpretation.   Filed Vitals:   11/29/12 0826 11/29/12 0833 11/29/12 0836 11/29/12 1139  BP: 133/80 114/70 87/45 141/68  Pulse: 88 98 104 84  Temp: 97.6 F (36.4 C)     TempSrc: Oral     Resp: 20   18  SpO2: 98%   100%    COORDINATION OF CARE: 9:15 AM Discussed ED treatment with pt and pt agrees.  9:15 AM Ordered:  Medications  0.9 %  sodium chloride infusion ( Intravenous Rate/Dose Change 11/29/12 1047)  sodium chloride 0.9 % bolus 250 mL (0 mLs Intravenous Stopped 11/29/12 1046)       Labs Reviewed  CBC WITH DIFFERENTIAL - Abnormal; Notable for the following:    Hemoglobin 12.7 (*)    Neutrophils Relative % 78 (*)    Lymphocytes Relative 11 (*)    All other components within normal limits  COMPREHENSIVE METABOLIC PANEL - Abnormal; Notable for the following:    Sodium 134 (*)    Glucose, Bld 216 (*)    GFR calc non Af Amer 64 (*)    GFR calc Af Amer 75 (*)    All other components within normal limits  GLUCOSE, CAPILLARY - Abnormal; Notable for the following:    Glucose-Capillary 175 (*)    All other components within normal limits  TROPONIN I   Dg Abd 1 View  11/28/2012   *RADIOLOGY REPORT*  Clinical Data:  Abdominal pain on the left  ABDOMEN - 1 VIEW  Comparison: Acute abdominal series 08/05/2012  Findings: 280 upper part of the abdomen is not completely included on image.  Visualized bowel gas pattern is nonobstructive.  There is a moderate amount of stool in the colon.  No radiopaque urinary tract calculus is identified.  Stable probable vascular calcification of the left pelvis.  Mild convex left scoliosis of the lumbar spine with multilevel degenerative changes.  IMPRESSION: Moderate amount of stool in the colon.  Question if the patient could be constipated. Nonobstructive bowel gas pattern.   Original Report Authenticated By: Britta Mccreedy, M.D.    Date: 11/29/2012  Rate: 87  Rhythm: normal sinus rhythm  QRS Axis: normal  Intervals: normal  ST/T Wave abnormalities: nonspecific T wave changes  Conduction Disutrbances:none  Narrative Interpretation:   Old EKG Reviewed: none available Not able to find an old EKG but patient should have one. Patient does have prolonged QT.  Results for orders placed during the hospital encounter of 11/29/12  CBC WITH DIFFERENTIAL  Result Value Range   WBC 8.3  4.0 - 10.5 K/uL   RBC 4.58  4.22 - 5.81 MIL/uL   Hemoglobin 12.7 (*) 13.0 - 17.0 g/dL   HCT 14.7  82.9 - 56.2 %   MCV 85.4  78.0 - 100.0 fL   MCH 27.7  26.0 - 34.0 pg   MCHC 32.5  30.0 - 36.0 g/dL   RDW 13.0  86.5 - 78.4 %   Platelets 219  150 - 400 K/uL   Neutrophils Relative % 78 (*) 43 - 77 %   Neutro Abs 6.4  1.7 - 7.7 K/uL   Lymphocytes Relative 11 (*) 12 - 46 %   Lymphs Abs 0.9  0.7 - 4.0 K/uL   Monocytes Relative 10  3 - 12 %   Monocytes Absolute 0.8  0.1 - 1.0 K/uL   Eosinophils Relative 2  0 - 5 %   Eosinophils Absolute 0.2  0.0 - 0.7 K/uL   Basophils Relative 0  0 - 1 %   Basophils Absolute 0.0  0.0 - 0.1 K/uL  COMPREHENSIVE METABOLIC PANEL      Result Value Range   Sodium 134 (*) 135 - 145 mEq/L   Potassium 4.4  3.5 - 5.1 mEq/L   Chloride 96  96 - 112 mEq/L   CO2 22  19 -  32 mEq/L   Glucose, Bld 216 (*) 70 - 99 mg/dL   BUN 14  6 - 23 mg/dL   Creatinine, Ser 6.96  0.50 - 1.35 mg/dL   Calcium 29.5  8.4 - 28.4 mg/dL   Total Protein 7.8  6.0 - 8.3 g/dL   Albumin 4.2  3.5 - 5.2 g/dL   AST 21  0 - 37 U/L   ALT 19  0 - 53 U/L   Alkaline Phosphatase 98  39 - 117 U/L   Total Bilirubin 0.3  0.3 - 1.2 mg/dL   GFR calc non Af Amer 64 (*) >90 mL/min   GFR calc Af Amer 75 (*) >90 mL/min  TROPONIN I      Result Value Range   Troponin I <0.30  <0.30 ng/mL  GLUCOSE, CAPILLARY      Result Value Range   Glucose-Capillary 175 (*) 70 - 99 mg/dL     1. Fatigue   2. Chronic orthostatic hypotension       MDM  Patient with long-standing history of orthostatic hypotension is normally asymptomatic also long-standing history of anxiety and fatigue. Patient was evaluated in the emergency department yesterday and was recently discharged from the hospital just couple days ago. He was admitted the on the 16th to the hospitalist service was evaluated by cardiology for the orthostatic hypotension so the cause is not clear. As stated patient's asymptomatic. Patient was not discharged home with home nurse we arrange that here today to the care manager and social services. Patient once to be admitted to full service nursing facility however he does not meet criteria. Patient was offered assisted living as an option but he doesn't want to take that option. Have arranged for a home nurse visit with therapy and social worker to visit his house. Also did discuss the possibility of readmission with the [facility]. They felt that there would be nothing further that they can offer him by readmitting him. Patient been stable here in the emergency department. I had dealt 1 blood pressure that was low the others have been normal. As stated though his had chronic orthostatic hypotension  for a long time and is usually completely asymptomatic when the blood pressure is low.      I  personally performed the services described in this documentation, which was scribed in my presence. The recorded information has been reviewed and is accurate.     Jeffrey Jakes, MD 11/29/12 [phone]

## 2012-11-29 NOTE — ED Notes (Signed)
Per EMS - seen here yesterday for same.  Increased weakness and hypotension "for a while."  Seen at Dr. Sharyon Medicus office this morning and staff reports BP 80/40 at office.  EMS reports bp wnl.  Pt also c/o cp with sob dizziness and weakness.  Pt transfer from EMS stretcher to bed without assistance and difficulty.

## 2012-11-30 ENCOUNTER — Telehealth: Payer: Self-pay | Admitting: *Deleted

## 2012-11-30 NOTE — Telephone Encounter (Signed)
I reviewed the ER note. He was orthostatic in ER. Will evaluate further on follow up. Ok to hold meds until we sort this out.

## 2012-11-30 NOTE — Telephone Encounter (Signed)
PER HOME HEALTH PATIENT HAS BEEN IN AND OUT OF ED BECAUSE HE THINKS HIS BP KEEP DROPPING. PATIENT HAD DECIDED TO STOP TAKING MOST OF HIS MEDICATIONS INCLUDING ASPRIN, VIT B, ATORVASTATIN, CALCIUM, CARVEDILOL, FISH OIL, EMDUR, ONGLYZA, POTASSIUM, CITRALIN, TRIGENTA  PATIENT IS SCHEDULED TO SEE KATHRYN 12/10/12/TMJ

## 2012-11-30 NOTE — Telephone Encounter (Signed)
FYI the following notations:

## 2012-12-01 ENCOUNTER — Encounter (HOSPITAL_COMMUNITY): Payer: Self-pay | Admitting: *Deleted

## 2012-12-01 ENCOUNTER — Emergency Department (HOSPITAL_COMMUNITY)
Admission: EM | Admit: 2012-12-01 | Discharge: 2012-12-01 | Disposition: A | Discharge status: Home / Self Care | Payer: Medicare Other | Attending: Emergency Medicine | Admitting: Emergency Medicine

## 2012-12-01 DIAGNOSIS — D649 Anemia, unspecified: Secondary | ICD-10-CM | POA: Insufficient documentation

## 2012-12-01 DIAGNOSIS — F411 Generalized anxiety disorder: Secondary | ICD-10-CM | POA: Insufficient documentation

## 2012-12-01 DIAGNOSIS — E785 Hyperlipidemia, unspecified: Secondary | ICD-10-CM | POA: Insufficient documentation

## 2012-12-01 DIAGNOSIS — K219 Gastro-esophageal reflux disease without esophagitis: Secondary | ICD-10-CM | POA: Insufficient documentation

## 2012-12-01 DIAGNOSIS — Z8781 Personal history of (healed) traumatic fracture: Secondary | ICD-10-CM | POA: Insufficient documentation

## 2012-12-01 DIAGNOSIS — I951 Orthostatic hypotension: Secondary | ICD-10-CM | POA: Insufficient documentation

## 2012-12-01 DIAGNOSIS — Z9861 Coronary angioplasty status: Secondary | ICD-10-CM | POA: Insufficient documentation

## 2012-12-01 DIAGNOSIS — Z87891 Personal history of nicotine dependence: Secondary | ICD-10-CM | POA: Insufficient documentation

## 2012-12-01 DIAGNOSIS — Z79899 Other long term (current) drug therapy: Secondary | ICD-10-CM | POA: Insufficient documentation

## 2012-12-01 DIAGNOSIS — E119 Type 2 diabetes mellitus without complications: Secondary | ICD-10-CM | POA: Insufficient documentation

## 2012-12-01 DIAGNOSIS — F419 Anxiety disorder, unspecified: Secondary | ICD-10-CM

## 2012-12-01 DIAGNOSIS — M1990 Unspecified osteoarthritis, unspecified site: Secondary | ICD-10-CM | POA: Insufficient documentation

## 2012-12-01 DIAGNOSIS — Z951 Presence of aortocoronary bypass graft: Secondary | ICD-10-CM | POA: Insufficient documentation

## 2012-12-01 DIAGNOSIS — Z8711 Personal history of peptic ulcer disease: Secondary | ICD-10-CM | POA: Insufficient documentation

## 2012-12-01 DIAGNOSIS — Z8701 Personal history of pneumonia (recurrent): Secondary | ICD-10-CM | POA: Insufficient documentation

## 2012-12-01 DIAGNOSIS — G8929 Other chronic pain: Secondary | ICD-10-CM | POA: Insufficient documentation

## 2012-12-01 DIAGNOSIS — Z8673 Personal history of transient ischemic attack (TIA), and cerebral infarction without residual deficits: Secondary | ICD-10-CM | POA: Insufficient documentation

## 2012-12-01 DIAGNOSIS — Z8679 Personal history of other diseases of the circulatory system: Secondary | ICD-10-CM | POA: Insufficient documentation

## 2012-12-01 DIAGNOSIS — Z7982 Long term (current) use of aspirin: Secondary | ICD-10-CM | POA: Insufficient documentation

## 2012-12-01 DIAGNOSIS — Z87448 Personal history of other diseases of urinary system: Secondary | ICD-10-CM | POA: Insufficient documentation

## 2012-12-01 NOTE — ED Provider Notes (Signed)
History     CSN: [id number]  Arrival date & time 12/01/12  0156   First MD Initiated Contact with Patient 12/01/12 0210      Chief Complaint  Patient presents with  . Hypotension    (Consider location/radiation/quality/duration/timing/severity/associated sxs/prior treatment) HPI HPI Comments: Jeffrey [name] is a 79 y.o. male who presents to the Emergency Department complaining of needing blood pressure check. He feels there is a gas or freon leak in his apartment.He came to the ER to have [place] call the Fire Department and to have his blood pressure checked again. He was seen here yesterday for weakness and hypotension. He denies dizziness, chest pain, abdominal pain, vision changes, fever, chills.   PCP Dr. Sherwood Gambler   Past Medical History  Diagnosis Date  . Diabetes mellitus, type II   . Hyperlipidemia     Lipid profile in 02/2012:135, 227, 41, 49  . Arteriosclerotic cardiovascular disease (ASCVD)     a. CABG x 4 in 1989 (VG->OM1->OM2, VG->RCA, LIMA->LAD), b. 05/2010: DES to VG-OM1/OM2, DES to distal LCx. c. NSTEMI in 04/2011 - TO distal LCX stent and VG->OM2. d. 02/2012 NSTEMI DES to VG-OM1/continuation to OM2 occluded. e. inferior STEMI s/p DES to SVG-RAMUS 06/2012. f. inferolat STEMI 09/2012 s/p DES to SVG-interm.  . Hypertension   . Osteoarthritis   . CVA (cerebral infarction)     details unclear. pt reports light stroke last year involving L leg. No imaging to suggest hemorrhagic etiology  . Pneumonia   . Cervical vertebral fracture   . Chronic back pain   . Benign prostatic hypertrophy     Required urinary catheter x4 weeks for retention  . Peptic ulcer disease   . Gastroesophageal reflux disease   . Ischemic cardiomyopathy     EF 45-50% 3/32014  . DM (diabetes mellitus)   . Anemia     Past Surgical History  Procedure Laterality Date  . Coronary angioplasty with stent placement      2011, 02/2012  . Tonsillectomy    . Coronary artery bypass graft      Family History   Problem Relation Age of Onset  . Early death      Parents died young  . Appendicitis Mother     Pt was 59 year old  . Heart attack Father 40    History  Substance Use Topics  . Smoking status: Former Smoker -- 2.00 packs/day for 10 years    Types: Cigarettes    Quit date: 07/14/1961  . Smokeless tobacco: Current User    Types: Chew  . Alcohol Use: No      Review of Systems  Constitutional: Negative for fever.       10 Systems reviewed and are negative for acute change except as noted in the HPI.  HENT: Negative for congestion.   Eyes: Negative for discharge and redness.  Respiratory: Negative for cough and shortness of breath.   Cardiovascular: Negative for chest pain.  Gastrointestinal: Negative for vomiting and abdominal pain.  Musculoskeletal: Negative for back pain.  Skin: Negative for rash.  Neurological: Negative for syncope, numbness and headaches.  Psychiatric/Behavioral:       No behavior change.    Allergies  Review of patient's allergies indicates no known allergies.  Home Medications   Current Outpatient Rx  Name  Route  Sig  Dispense  Refill  . aspirin 325 MG tablet   Oral   Take 325 mg by mouth daily.         Marland Kitchen  B Complex-Biotin-FA (B-COMPLEX PO)   Oral   Take 1 tablet by mouth daily.          Marland Kitchen buPROPion (WELLBUTRIN SR) 100 MG 12 hr tablet   Oral   Take 100 mg by mouth daily.         . calcium carbonate (TUMS - DOSED IN MG ELEMENTAL CALCIUM) 500 MG chewable tablet   Oral   Chew 1 tablet by mouth 4 (four) times daily - after meals and at bedtime.         . COD LIVER OIL PO   Oral   Take 1 tablet by mouth daily.         Tery Sanfilippo Sodium (STOOL SOFTENER) 100 MG capsule   Oral   Take 200 mg by mouth 2 (two) times daily. Constipation         . ferrous sulfate 325 (65 FE) MG tablet   Oral   Take 325 mg by mouth daily with breakfast.         . fish oil-omega-3 fatty acids 1000 MG capsule   Oral   Take 1 g by mouth daily.          . isosorbide mononitrate (IMDUR) 30 MG 24 hr tablet   Oral   Take 15 mg by mouth daily.         Marland Kitchen linagliptin (TRADJENTA) 5 MG TABS tablet   Oral   Take 5 mg by mouth daily.         Marland Kitchen loratadine (CLARITIN) 10 MG tablet   Oral   Take 10 mg by mouth daily.         . metFORMIN (GLUCOPHAGE) 500 MG tablet   Oral   Take 500 mg by mouth 2 (two) times daily. MAY TAKE 1 1/2 TAB IF NEEDED, PT ADJUSTS THIS MED HIMSELF         . Multiple Vitamin (MULTIVITAMIN WITH MINERALS) TABS   Oral   Take 1 tablet by mouth daily.         . nitroGLYCERIN (NITROSTAT) 0.4 MG SL tablet   Sublingual   Place 1 tablet (0.4 mg total) under the tongue every 5 (five) minutes x 3 doses as needed for chest pain.   25 tablet   3   . omeprazole (PRILOSEC) 40 MG capsule   Oral   Take 40 mg by mouth daily.         . Ticagrelor (BRILINTA) 90 MG TABS tablet   Oral   Take 1 tablet (90 mg total) by mouth 2 (two) times daily.   60 tablet   6   . albuterol (PROVENTIL HFA;VENTOLIN HFA) 108 (90 BASE) MCG/ACT inhaler   Inhalation   Inhale 2 puffs into the lungs every 6 (six) hours as needed. For shortness of breath           BP 103/69  Pulse 102  Temp(Src) 98.2 F (36.8 C) (Oral)  Resp 18  Ht 5\' 10"  (1.778 m)  Wt 170 lb (77.111 kg)  BMI 24.39 kg/m2  SpO2 97%  Physical Exam  Nursing note and vitals reviewed. Constitutional: He appears well-developed and well-nourished.  Awake, alert, nontoxic appearance.  HENT:  Head: Normocephalic and atraumatic.  HOH  Eyes: EOM are normal. Pupils are equal, round, and reactive to light.  Neck: Neck supple.  Cardiovascular: Normal rate and intact distal pulses.   Pulmonary/Chest: Effort normal and breath sounds normal. He exhibits no tenderness.  Abdominal: Soft. Bowel sounds are normal.  There is no tenderness. There is no rebound.  Musculoskeletal: He exhibits edema. He exhibits no tenderness.  Baseline ROM, no obvious new focal weakness.   Neurological:  Mental status and motor strength appears baseline for patient and situation.  Skin: No rash noted.  Psychiatric: He has a normal mood and affect.    ED Course  Procedures (including critical care time)   Patient with longstanding orthostatic hypotension which has been evaluated by [facility] cardiology. The patient wants to be admitted to a SNF but does not meet criteria. He is not willing to accept an assisted living facility. Arrangements were made yesterday for home health nurse. Has a Child psychotherapist who will be seeing him at home. Patient is stable here in the ER.  MDM  Patient here to have his blood pressure checked. BP is acceptable. Patient is not dizzy. Walks in the department without trouble. Pt stable in ED with no significant deterioration in condition.The patient appears reasonably screened and/or stabilized for discharge and I doubt any other medical condition or other [facility] requiring further screening, evaluation, or treatment in the ED at this time prior to discharge.  MDM Reviewed: nursing note, vitals and previous chart Reviewed previous: labs           [facility]. Colon Branch, MD 12/01/12 [phone]

## 2012-12-01 NOTE — ED Notes (Addendum)
Pt states came here for a blood pressure check. Pt thinks he has a gas leak or freon leak in his apartment. Pt asked how many people leave in the apartment he did not know. But no one else complaining of gas leak.

## 2012-12-01 NOTE — ED Notes (Signed)
Patient had agas or freon leak in apartment. Came to er to have bp checked

## 2012-12-01 NOTE — ED Notes (Signed)
Pt alert & oriented x4, stable gait. Patient given discharge instructions, paperwork & prescription(s). Patient  instructed to stop at the registration desk to finish any additional paperwork. Patient verbalized understanding. Pt left department w/ no further questions. 

## 2012-12-01 NOTE — Telephone Encounter (Addendum)
Jeffrey Seal NP with [facility] called to advise that the pt has been to the ED once again this morning and notes that he is weak and now needs assistance with walking, pt still refusing to take his medications, gave CS instructions per [facility] NP concerning pt recent refusal to take medications, CS advised pt is now willing to go to [facility] (nursing facility) and is in need of a Dr to sign pt FL2 to have him placed at the facility, CS advised pt PCP Jeffrey [name] has a PA covering for him for the remainder of the week and they will need a Dr to sign the FL2 to place the pt, noted RR out of office today, spoke to Dr Jeffrey [name] in absence of Dr RR, advised PCP PA should have contact with PCP other partner to be able to sign FL2 for pt per Dr Jeffrey [name] have never seen this pt and is not familiar with him, also advised that if pt is seen in the ED, the ED MD can also sign FL2 for the pt as well.  Spoke to CS to advise and she noted that she will contact Dr Jeffrey [name] today for possible signature.  CS NP understood all instructions given, apologized for inconvience

## 2012-12-09 NOTE — Progress Notes (Signed)
NO Show No Known Allergies  Current Outpatient Prescriptions  Medication Sig Dispense Refill  . albuterol (PROVENTIL HFA;VENTOLIN HFA) 108 (90 BASE) MCG/ACT inhaler Inhale 2 puffs into the lungs every 6 (six) hours as needed. For shortness of breath      . aspirin 325 MG tablet Take 325 mg by mouth daily.      . B Complex-Biotin-FA (B-COMPLEX PO) Take 1 tablet by mouth daily.       Marland Kitchen buPROPion (WELLBUTRIN SR) 100 MG 12 hr tablet Take 100 mg by mouth daily.      . calcium carbonate (TUMS - DOSED IN MG ELEMENTAL CALCIUM) 500 MG chewable tablet Chew 1 tablet by mouth 4 (four) times daily - after meals and at bedtime.      . COD LIVER OIL PO Take 1 tablet by mouth daily.      Tery Sanfilippo Sodium (STOOL SOFTENER) 100 MG capsule Take 200 mg by mouth 2 (two) times daily. Constipation      . ferrous sulfate 325 (65 FE) MG tablet Take 325 mg by mouth daily with breakfast.      . fish oil-omega-3 fatty acids 1000 MG capsule Take 1 g by mouth daily.      . isosorbide mononitrate (IMDUR) 30 MG 24 hr tablet Take 15 mg by mouth daily.      Marland Kitchen linagliptin (TRADJENTA) 5 MG TABS tablet Take 5 mg by mouth daily.      Marland Kitchen loratadine (CLARITIN) 10 MG tablet Take 10 mg by mouth daily.      . metFORMIN (GLUCOPHAGE) 500 MG tablet Take 500 mg by mouth 2 (two) times daily. MAY TAKE 1 1/2 TAB IF NEEDED, PT ADJUSTS THIS MED HIMSELF      . Multiple Vitamin (MULTIVITAMIN WITH MINERALS) TABS Take 1 tablet by mouth daily.      . nitroGLYCERIN (NITROSTAT) 0.4 MG SL tablet Place 1 tablet (0.4 mg total) under the tongue every 5 (five) minutes x 3 doses as needed for chest pain.  25 tablet  3  . omeprazole (PRILOSEC) 40 MG capsule Take 40 mg by mouth daily.      . Ticagrelor (BRILINTA) 90 MG TABS tablet Take 1 tablet (90 mg total) by mouth 2 (two) times daily.  60 tablet  6   No current facility-administered medications for this visit.    Past Medical History  Diagnosis Date  . Diabetes mellitus, type II   . Hyperlipidemia       Lipid profile in 02/2012:135, 227, 41, 49  . Arteriosclerotic cardiovascular disease (ASCVD)     a. CABG x 4 in 1989 (VG->OM1->OM2, VG->RCA, LIMA->LAD), b. 05/2010: DES to VG-OM1/OM2, DES to distal LCx. c. NSTEMI in 04/2011 - TO distal LCX stent and VG->OM2. d. 02/2012 NSTEMI DES to VG-OM1/continuation to OM2 occluded. e. inferior STEMI s/p DES to SVG-RAMUS 06/2012. f. inferolat STEMI 09/2012 s/p DES to SVG-interm.  . Hypertension   . Osteoarthritis   . CVA (cerebral infarction)     details unclear. pt reports light stroke last year involving L leg. No imaging to suggest hemorrhagic etiology  . Pneumonia   . Cervical vertebral fracture   . Chronic back pain   . Benign prostatic hypertrophy     Required urinary catheter x4 weeks for retention  . Peptic ulcer disease   . Gastroesophageal reflux disease   . Ischemic cardiomyopathy     EF 45-50% 3/32014  . DM (diabetes mellitus)   . Anemia     Past  Surgical History  Procedure Laterality Date  . Coronary angioplasty with stent placement      2011, 02/2012  . Tonsillectomy    . Coronary artery bypass graft      ROS: PHYSICAL EXAM There were no vitals taken for this visit.  EKG:  ASSESSMENT AND PLAN

## 2012-12-10 ENCOUNTER — Encounter: Payer: Medicare Other | Admitting: Adult Health

## 2012-12-14 NOTE — Assessment & Plan Note (Signed)
Excellent control of total and LDL cholesterol with current therapy, which will be continued. Triglyceride level and HDL would likely improve with better control of diabetes.

## 2013-01-28 ENCOUNTER — Ambulatory Visit (INDEPENDENT_AMBULATORY_CARE_PROVIDER_SITE_OTHER): Payer: Medicare Other | Admitting: Adult Health

## 2013-01-28 ENCOUNTER — Encounter: Payer: Self-pay | Admitting: Adult Health

## 2013-01-28 VITALS — BP 122/69 | HR 93 | Ht 67.0 in | Wt 173.0 lb

## 2013-01-28 DIAGNOSIS — I679 Cerebrovascular disease, unspecified: Secondary | ICD-10-CM

## 2013-01-28 DIAGNOSIS — I2510 Atherosclerotic heart disease of native coronary artery without angina pectoris: Secondary | ICD-10-CM

## 2013-01-28 DIAGNOSIS — I10 Essential (primary) hypertension: Secondary | ICD-10-CM

## 2013-01-28 DIAGNOSIS — I255 Ischemic cardiomyopathy: Secondary | ICD-10-CM

## 2013-01-28 DIAGNOSIS — I2589 Other forms of chronic ischemic heart disease: Secondary | ICD-10-CM

## 2013-01-28 DIAGNOSIS — I951 Orthostatic hypotension: Secondary | ICD-10-CM

## 2013-01-28 DIAGNOSIS — I7090 Unspecified atherosclerosis: Secondary | ICD-10-CM

## 2013-01-28 NOTE — Assessment & Plan Note (Signed)
It is recommended the patient not have any elective procedures unless he has been on Brilintal for a minimum of one year. He has multiple stents placed with most recent PCI completed in March of 2014. Would recommend waiting until after March of 2015 to procedure completed unless it is an emergent situation. The patient does now wish to proceed with cystoscopy unless he is symptomatic. He states now he is able to urinate and has no issues with incontinence or healing full.  Otherwise the patient is asymptomatic from a cardiac standpoint. We will continue his current medication regimen. No changes at this time.

## 2013-01-28 NOTE — Patient Instructions (Signed)
Your physician recommends that you schedule a follow-up appointment in: 6 MONTHS   Your physician recommends that you continue on your current medications as directed. Please refer to the Current Medication list given to you today.  

## 2013-01-28 NOTE — Progress Notes (Deleted)
Name: Jeffrey [name]    DOB: 16-Feb-1929  Age: 79 y.o.  MR#: [id number]       PCP:  [name], MD      Insurance: Payor: MEDICARE / Plan: MEDICARE PART A AND B / Product Type: *No Product type* /   CC:    Chief Complaint  Patient presents with  . Coronary Artery Disease  . Hypertension    VS Filed Vitals:   01/28/13 1447 01/28/13 1451  BP:  122/69  Pulse: 92 93  Height:  5\' 7"  (1.702 m)  Weight:  173 lb (78.472 kg)    Weights Current Weight  01/28/13 173 lb (78.472 kg)  12/01/12 170 lb (77.111 kg)  11/28/12 170 lb (77.111 kg)    Blood Pressure  BP Readings from Last 3 Encounters:  01/28/13 122/69  12/01/12 103/69  11/29/12 141/68     Admit date:  (Not on file) Last encounter with RMR:  11/29/2012   Allergy Review of patient's allergies indicates no known allergies.  Current Outpatient Prescriptions  Medication Sig Dispense Refill  . calcium carbonate (TUMS - DOSED IN MG ELEMENTAL CALCIUM) 500 MG chewable tablet Chew 1 tablet by mouth 4 (four) times daily - after meals and at bedtime.      Tery Sanfilippo Sodium (STOOL SOFTENER) 100 MG capsule Take 200 mg by mouth 2 (two) times daily. Constipation      . ferrous sulfate 325 (65 FE) MG tablet Take 325 mg by mouth daily with breakfast.      . metFORMIN (GLUCOPHAGE) 500 MG tablet Take 500 mg by mouth 2 (two) times daily. MAY TAKE 1 1/2 TAB IF NEEDED, PT ADJUSTS THIS MED HIMSELF      . Multiple Vitamin (MULTIVITAMIN WITH MINERALS) TABS Take 1 tablet by mouth daily.      . nitroGLYCERIN (NITROSTAT) 0.4 MG SL tablet Place 1 tablet (0.4 mg total) under the tongue every 5 (five) minutes x 3 doses as needed for chest pain.  25 tablet  3  . pantoprazole (PROTONIX) 20 MG tablet       . sulfamethoxazole-trimethoprim (BACTRIM,SEPTRA) 400-80 MG per tablet       . tamsulosin (FLOMAX) 0.4 MG CAPS       . Ticagrelor (BRILINTA) 90 MG TABS tablet Take 1 tablet (90 mg total) by mouth 2 (two) times daily.  60 tablet  6   No current  facility-administered medications for this visit.    Discontinued Meds:    Medications Discontinued During This Encounter  Medication Reason  . albuterol (PROVENTIL HFA;VENTOLIN HFA) 108 (90 BASE) MCG/ACT inhaler Error  . aspirin 325 MG tablet Error  . B Complex-Biotin-FA (B-COMPLEX PO) Error  . buPROPion (WELLBUTRIN SR) 100 MG 12 hr tablet Error  . COD LIVER OIL PO Error  . fish oil-omega-3 fatty acids 1000 MG capsule Error  . isosorbide mononitrate (IMDUR) 30 MG 24 hr tablet Error  . linagliptin (TRADJENTA) 5 MG TABS tablet Error  . loratadine (CLARITIN) 10 MG tablet Error  . omeprazole (PRILOSEC) 40 MG capsule Error    Patient Active Problem List   Diagnosis Date Noted  . Noncompliance 11/22/2012  . Anemia, normocytic normochromic   . Orthostatic hypotension 06/24/2012  . Cardiomyopathy, ischemic 06/21/2012  . Cerebrovascular disease 06/08/2012  . Diabetes mellitus, type II   . Hypertension   . Arteriosclerotic cardiovascular disease (ASCVD) 10/25/2011  . Hyperlipidemia 10/24/2011  . BPH (benign prostatic hyperplasia) 05/07/2011    LABS    Component Value Date/Time  NA 134* 11/29/2012 0933   NA 134* 11/27/2012 0640   NA 132* 11/26/2012 1101   K 4.4 11/29/2012 0933   K 4.1 11/27/2012 0640   K 4.4 11/26/2012 1101   CL 96 11/29/2012 0933   CL 100 11/27/2012 0640   CL 96 11/26/2012 1101   CO2 22 11/29/2012 0933   CO2 23 11/27/2012 0640   CO2 22 11/26/2012 1101   GLUCOSE 216* 11/29/2012 0933   GLUCOSE 194* 11/27/2012 0640   GLUCOSE 284* 11/26/2012 1101   BUN 14 11/29/2012 0933   BUN 10 11/27/2012 0640   BUN 13 11/26/2012 1101   CREATININE 1.05 11/29/2012 0933   CREATININE 0.79 11/27/2012 0640   CREATININE 0.90 11/26/2012 1101   CREATININE 0.98 12/04/2011 1208   CALCIUM 10.4 11/29/2012 0933   CALCIUM 9.0 11/27/2012 0640   CALCIUM 9.3 11/26/2012 1101   GFRNONAA 64* 11/29/2012 0933   GFRNONAA 82* 11/27/2012 0640   GFRNONAA 78* 11/26/2012 1101   GFRAA 75* 11/29/2012 0933   GFRAA >90  11/27/2012 0640   GFRAA >90 11/26/2012 1101   CMP     Component Value Date/Time   NA 134* 11/29/2012 0933   K 4.4 11/29/2012 0933   CL 96 11/29/2012 0933   CO2 22 11/29/2012 0933   GLUCOSE 216* 11/29/2012 0933   BUN 14 11/29/2012 0933   CREATININE 1.05 11/29/2012 0933   CREATININE 0.98 12/04/2011 1208   CALCIUM 10.4 11/29/2012 0933   PROT 7.8 11/29/2012 0933   ALBUMIN 4.2 11/29/2012 0933   AST 21 11/29/2012 0933   ALT 19 11/29/2012 0933   ALKPHOS 98 11/29/2012 0933   BILITOT 0.3 11/29/2012 0933   GFRNONAA 64* 11/29/2012 0933   GFRAA 75* 11/29/2012 0933       Component Value Date/Time   WBC 8.3 11/29/2012 0933   WBC 7.7 11/28/2012 1405   WBC 6.8 11/26/2012 1101   HGB 12.7* 11/29/2012 0933   HGB 11.4* 11/28/2012 1405   HGB 10.9* 11/26/2012 1101   HCT 39.1 11/29/2012 0933   HCT 34.9* 11/28/2012 1405   HCT 32.8* 11/26/2012 1101   MCV 85.4 11/29/2012 0933   MCV 85.7 11/28/2012 1405   MCV 85.6 11/26/2012 1101    Lipid Panel     Component Value Date/Time   CHOL 124 10/07/2012 0700   TRIG 211* 10/07/2012 0700   HDL 31* 10/07/2012 0700   CHOLHDL 4.0 10/07/2012 0700   VLDL 42* 10/07/2012 0700   LDLCALC 51 10/07/2012 0700    ABG    Component Value Date/Time   TCO2 23 10/06/2012 1545     Lab Results  Component Value Date   TSH 3.483 10/16/2012   BNP (last 3 results)  Recent Labs  04/17/12 1521 07/29/12 1340 07/29/12 1855  PROBNP 438.7 863.6* 845.2*   Cardiac Panel (last 3 results) No results found for this basename: CKTOTAL, CKMB, TROPONINI, RELINDX,  in the last 72 hours  Iron/TIBC/Ferritin No results found for this basename: iron, tibc, ferritin     EKG Orders placed in visit on 01/28/13  . EKG 12-LEAD     Prior Assessment and Plan Problem List as of 01/28/2013     Cardiovascular and Mediastinum   Arteriosclerotic cardiovascular disease (ASCVD)   Last Assessment & Plan   11/18/2012 Office Visit Written 11/24/2012 10:37 AM by Kathlen Brunswick, MD     Chest pain is long-standing and  atypical. Imdur is being provided at subtherapeutic dose and will be discontinued. Potassium likewise does not appear  necessary.    Hypertension   Last Assessment & Plan   11/18/2012 Office Visit Written 11/24/2012 10:40 AM by Kathlen Brunswick, MD     No significant elevation in blood pressure in recent months.    Cerebrovascular disease   Last Assessment & Plan   09/09/2012 Office Visit Written 09/09/2012  3:42 PM by Kathlen Brunswick, MD     Recent carotid ultrasound negative for obstruction. Optimal control of cardiovascular risk factors appears effective in preventing progression of disease and will be continued.    Cardiomyopathy, ischemic   Last Assessment & Plan   11/18/2012 Office Visit Edited 12/14/2012 10:32 AM by Kathlen Brunswick, MD     Repeat echocardiogram 2 months ago demonstrated some recovery of LV systolic function, which is now only mildly depressed. No clinical congestive heart failure.  Dose of carvedilol will be titrated upwards.    Orthostatic hypotension   Last Assessment & Plan   11/18/2012 Office Visit Edited 12/14/2012 10:33 AM by Kathlen Brunswick, MD     Antidepressants may be contributing. Zoloft and bupropion will be held and blood pressure reassessed in one week.      Endocrine   Diabetes mellitus, type II   Last Assessment & Plan   09/09/2012 Office Visit Written 09/09/2012  3:38 PM by Kathlen Brunswick, MD     Probably requires increased therapy for diabetes. Patient will discuss this with Dr. Sherwood Gambler at his next visit.      Genitourinary   BPH (benign prostatic hyperplasia)   Last Assessment & Plan   09/09/2012 Office Visit Edited 09/11/2012 11:21 PM by Kathlen Brunswick, MD     Patient advised not to increase his dose of Flomax or other medications without specific instructions and will resume his usual daily dose.  He is scheduled to return to see Dr. Jerre Simon in the near future for catheter removal.      Other   Hyperlipidemia   Last Assessment & Plan    11/18/2012 Office Visit Written 12/14/2012 10:38 AM by Kathlen Brunswick, MD     Excellent control of total and LDL cholesterol with current therapy, which will be continued. Triglyceride level and HDL would likely improve with better control of diabetes.    Anemia, normocytic normochromic   Noncompliance       Imaging: No results found.

## 2013-01-28 NOTE — Assessment & Plan Note (Signed)
He is without complaints of fluid retention or dyspnea on exertion. He is a resident of [facility] facility, and has generalized fatigue chronically. He is becoming more active at this facility. In fact he is extended his stay another 30 days. He appears to be well compensated. No changes in his medications.

## 2013-01-28 NOTE — Progress Notes (Signed)
HPI: [name] is an 79 year old patient of Dr. Tenny [name] for following for ongoing assessment and management of CAD, hypertension, ischemic cardiomyopathy. The patient has a history of prior PCI and CABG surgery. He was last seen by Dr. Dietrich [name] in June of 2014. At that time he complained of mild discomfort with exertion. After Jeffrey [name],  note states that the chest pain is long-standing atypical. In door was being provided a subtherapeutic dose and therefore was discontinued. Potassium was also discontinued. Carvedilol was increased to full 0.5 mg twice a day. Zoloft was also discontinued along with bupropion. He is here for followup.    He states today that he has been followed by urology, and will need to have a procedure that includes a cystoscopy and clipping of some tissue over his urethra due to inefficient bladder emptying. He is concerned that he is on Plavix and this will cause problems. He states that he would like to wait to have the procedure completed, but his urologist is requesting our evaluation and recommendations in this setting. His most recent PCI was completed in March of 2014. He is on Brilinta and ASA. He denies any chest pain dizziness nausea vomiting. He is tolerated medication changes without issues.  No Known Allergies  Current Outpatient Prescriptions  Medication Sig Dispense Refill  . calcium carbonate (TUMS - DOSED IN MG ELEMENTAL CALCIUM) 500 MG chewable tablet Chew 1 tablet by mouth 4 (four) times daily - after meals and at bedtime.      Jeffrey [name] Sodium (STOOL SOFTENER) 100 MG capsule Take 200 mg by mouth 2 (two) times daily. Constipation      . ferrous sulfate 325 (65 FE) MG tablet Take 325 mg by mouth daily with breakfast.      . metFORMIN (GLUCOPHAGE) 500 MG tablet Take 500 mg by mouth 2 (two) times daily. MAY TAKE 1 1/2 TAB IF NEEDED, PT ADJUSTS THIS MED HIMSELF      . Multiple Vitamin (MULTIVITAMIN WITH MINERALS) TABS Take 1 tablet by mouth daily.      .  nitroGLYCERIN (NITROSTAT) 0.4 MG SL tablet Place 1 tablet (0.4 mg total) under the tongue every 5 (five) minutes x 3 doses as needed for chest pain.  25 tablet  3  . pantoprazole (PROTONIX) 20 MG tablet       . sulfamethoxazole-trimethoprim (BACTRIM,SEPTRA) 400-80 MG per tablet       . tamsulosin (FLOMAX) 0.4 MG CAPS       . Ticagrelor (BRILINTA) 90 MG TABS tablet Take 1 tablet (90 mg total) by mouth 2 (two) times daily.  60 tablet  6   No current facility-administered medications for this visit.    Past Medical History  Diagnosis Date  . Diabetes mellitus, type II   . Hyperlipidemia     Lipid profile in 02/2012:135, 227, 41, 49  . Arteriosclerotic cardiovascular disease (ASCVD)     a. CABG x 4 in 1989 (VG->OM1->OM2, VG->RCA, LIMA->LAD), b. 05/2010: DES to VG-OM1/OM2, DES to distal LCx. c. NSTEMI in 04/2011 - TO distal LCX stent and VG->OM2. d. 02/2012 NSTEMI DES to VG-OM1/continuation to OM2 occluded. e. inferior STEMI s/p DES to SVG-RAMUS 06/2012. f. inferolat STEMI 09/2012 s/p DES to SVG-interm.  . Hypertension   . Osteoarthritis   . CVA (cerebral infarction)     details unclear. pt reports light stroke last year involving L leg. No imaging to suggest hemorrhagic etiology  . Pneumonia   . Cervical vertebral fracture   . Chronic back  pain   . Benign prostatic hypertrophy     Required urinary catheter x4 weeks for retention  . Peptic ulcer disease   . Gastroesophageal reflux disease   . Ischemic cardiomyopathy     EF 45-50% 3/32014  . DM (diabetes mellitus)   . Anemia     Past Surgical History  Procedure Laterality Date  . Coronary angioplasty with stent placement      2011, 02/2012  . Tonsillectomy    . Coronary artery bypass graft      ROS: PHYSICAL EXAM BP 122/69  Pulse 93  Ht 5\' 7"  (1.702 m)  Wt 173 lb (78.472 kg)  BMI 27.09 kg/m2 General: Well developed, well nourished, in no acute distress Head: Eyes PERRLA, No xanthomas.   Normal cephalic and atramatic  Lungs:  Clear bilaterally to auscultation and percussion. Heart: HRRR S1 S2, 1/6 systolic murmur.  Pulses are 2+ & equal.            No carotid bruit. No JVD.  No abdominal bruits. No femoral bruits. Abdomen: Bowel sounds are positive, abdomen soft and non-tender without masses or                  Hernia's noted. Msk:  Back normal, normal gait. Normal strength and tone for age. Extremities: No clubbing, cyanosis or edema.  DP +1 Neuro: Alert and oriented X 3. Psych:  Good affect, responds appropriately    ASSESSMENT AND PLAN

## 2013-01-28 NOTE — Assessment & Plan Note (Signed)
Currently well controlled. No changes in medication. He remains medically and dietary compliant.

## 2013-03-24 ENCOUNTER — Other Ambulatory Visit: Payer: Self-pay | Admitting: Adult Health

## 2013-03-25 ENCOUNTER — Other Ambulatory Visit: Payer: Self-pay | Admitting: Cardiology

## 2013-03-25 MED ORDER — LOSARTAN POTASSIUM 100 MG PO TABS: 100.0000 mg | ORAL_TABLET | Freq: Every day | ORAL | Status: DC

## 2013-04-17 ENCOUNTER — Encounter (HOSPITAL_COMMUNITY): Payer: Self-pay | Admitting: Emergency Medicine

## 2013-04-17 ENCOUNTER — Emergency Department (HOSPITAL_COMMUNITY)
Admission: EM | Admit: 2013-04-17 | Discharge: 2013-04-17 | Disposition: A | Discharge status: Home / Self Care | Payer: Medicare Other | Attending: Emergency Medicine | Admitting: Emergency Medicine

## 2013-04-17 ENCOUNTER — Emergency Department (HOSPITAL_COMMUNITY): Payer: Medicare Other

## 2013-04-17 DIAGNOSIS — Z9861 Coronary angioplasty status: Secondary | ICD-10-CM | POA: Insufficient documentation

## 2013-04-17 DIAGNOSIS — R609 Edema, unspecified: Secondary | ICD-10-CM | POA: Insufficient documentation

## 2013-04-17 DIAGNOSIS — Z862 Personal history of diseases of the blood and blood-forming organs and certain disorders involving the immune mechanism: Secondary | ICD-10-CM | POA: Insufficient documentation

## 2013-04-17 DIAGNOSIS — Z8739 Personal history of other diseases of the musculoskeletal system and connective tissue: Secondary | ICD-10-CM | POA: Insufficient documentation

## 2013-04-17 DIAGNOSIS — Z951 Presence of aortocoronary bypass graft: Secondary | ICD-10-CM | POA: Insufficient documentation

## 2013-04-17 DIAGNOSIS — K219 Gastro-esophageal reflux disease without esophagitis: Secondary | ICD-10-CM | POA: Insufficient documentation

## 2013-04-17 DIAGNOSIS — Z8711 Personal history of peptic ulcer disease: Secondary | ICD-10-CM | POA: Insufficient documentation

## 2013-04-17 DIAGNOSIS — R1013 Epigastric pain: Secondary | ICD-10-CM | POA: Insufficient documentation

## 2013-04-17 DIAGNOSIS — R42 Dizziness and giddiness: Secondary | ICD-10-CM | POA: Insufficient documentation

## 2013-04-17 DIAGNOSIS — I10 Essential (primary) hypertension: Secondary | ICD-10-CM | POA: Insufficient documentation

## 2013-04-17 DIAGNOSIS — Z8639 Personal history of other endocrine, nutritional and metabolic disease: Secondary | ICD-10-CM | POA: Insufficient documentation

## 2013-04-17 DIAGNOSIS — E119 Type 2 diabetes mellitus without complications: Secondary | ICD-10-CM | POA: Insufficient documentation

## 2013-04-17 DIAGNOSIS — Z87448 Personal history of other diseases of urinary system: Secondary | ICD-10-CM | POA: Insufficient documentation

## 2013-04-17 DIAGNOSIS — Z79899 Other long term (current) drug therapy: Secondary | ICD-10-CM | POA: Insufficient documentation

## 2013-04-17 DIAGNOSIS — Z8701 Personal history of pneumonia (recurrent): Secondary | ICD-10-CM | POA: Insufficient documentation

## 2013-04-17 DIAGNOSIS — Z87891 Personal history of nicotine dependence: Secondary | ICD-10-CM | POA: Insufficient documentation

## 2013-04-17 DIAGNOSIS — G8929 Other chronic pain: Secondary | ICD-10-CM | POA: Insufficient documentation

## 2013-04-17 DIAGNOSIS — Z8781 Personal history of (healed) traumatic fracture: Secondary | ICD-10-CM | POA: Insufficient documentation

## 2013-04-17 DIAGNOSIS — D649 Anemia, unspecified: Secondary | ICD-10-CM | POA: Insufficient documentation

## 2013-04-17 LAB — CBC WITH DIFFERENTIAL/PLATELET
Basophils Absolute: 0 10*3/uL (ref 0.0–0.1)
Basophils Relative: 0 % (ref 0–1)
Eosinophils Absolute: 0.2 10*3/uL (ref 0.0–0.7)
Lymphs Abs: 1.2 10*3/uL (ref 0.7–4.0)
MCH: 31.1 pg (ref 26.0–34.0)
Monocytes Absolute: 0.5 10*3/uL (ref 0.1–1.0)
Neutrophils Relative %: 73 % (ref 43–77)
Platelets: 169 10*3/uL (ref 150–400)
RBC: 4.09 MIL/uL — ABNORMAL LOW (ref 4.22–5.81)

## 2013-04-17 LAB — COMPREHENSIVE METABOLIC PANEL
ALT: 29 U/L (ref 0–53)
AST: 32 U/L (ref 0–37)
Albumin: 3.7 g/dL (ref 3.5–5.2)
Alkaline Phosphatase: 87 U/L (ref 39–117)
Glucose, Bld: 208 mg/dL — ABNORMAL HIGH (ref 70–99)
Potassium: 3.8 mEq/L (ref 3.5–5.1)
Sodium: 135 mEq/L (ref 135–145)
Total Protein: 6.7 g/dL (ref 6.0–8.3)

## 2013-04-17 NOTE — ED Notes (Signed)
Patient requesting crackers and coke. MD aware and permission obtained.

## 2013-04-17 NOTE — ED Notes (Signed)
Patient c/o "indigestion"/epigasrtic pain, bilateral swelling in ankles, and dizziness. Patient reports extensive cardiac history. Patient states "Usually when I come in with indigestion I end up getting stents." Patient reports "slight" shortness of breath.

## 2013-04-17 NOTE — ED Notes (Signed)
Patient with no complaints at this time. Respirations even and unlabored. Skin warm/dry. Discharge instructions reviewed with patient at this time. Patient given opportunity to voice concerns/ask questions. IV removed per policy and band-aid applied to site. Patient discharged at this time and left Emergency Department with steady gait.  

## 2013-04-17 NOTE — ED Provider Notes (Signed)
CSN: [id number]     Arrival date & time 04/17/13  1158 History  This chart was scribed for [name], MD by Dorothey Baseman, ED Scribe. This patient was seen in room APA05/APA05 and the patient's care was started at 1:09 PM.    Chief Complaint  Patient presents with  . Leg Swelling  . Gastrophageal Reflux  . Dizziness   Patient is a 79 y.o. male presenting with GERD. The history is provided by the patient. No language interpreter was used.  Gastrophageal Reflux This is a recurrent problem. The problem has been gradually improving. Associated symptoms include abdominal pain. Pertinent negatives include no chest pain and no headaches. The symptoms are relieved by medications. Treatments tried: tums. The treatment provided moderate relief.   HPI Comments: Jeffrey [name] is a 79 y.o. male who presents to the Emergency Department complaining of bilateral ankle swelling with associated dizziness. He also reports mild epigastric pain that he reports is currently improving after taking Tums at home. He reports a history of HTN that he states was high at home, but is otherwise well-controlled with medications.   Past Medical History  Diagnosis Date  . Diabetes mellitus, type II   . Hyperlipidemia     Lipid profile in 02/2012:135, 227, 41, 49  . Arteriosclerotic cardiovascular disease (ASCVD)     a. CABG x 4 in 1989 (VG->OM1->OM2, VG->RCA, LIMA->LAD), b. 05/2010: DES to VG-OM1/OM2, DES to distal LCx. c. NSTEMI in 04/2011 - TO distal LCX stent and VG->OM2. d. 02/2012 NSTEMI DES to VG-OM1/continuation to OM2 occluded. e. inferior STEMI s/p DES to SVG-RAMUS 06/2012. f. inferolat STEMI 09/2012 s/p DES to SVG-interm.  . Hypertension   . Osteoarthritis   . CVA (cerebral infarction)     details unclear. pt reports light stroke last year involving L leg. No imaging to suggest hemorrhagic etiology  . Pneumonia   . Cervical vertebral fracture   . Chronic back pain   . Benign prostatic hypertrophy      Required urinary catheter x4 weeks for retention  . Peptic ulcer disease   . Gastroesophageal reflux disease   . Ischemic cardiomyopathy     EF 45-50% 3/32014  . DM (diabetes mellitus)   . Anemia    Past Surgical History  Procedure Laterality Date  . Coronary angioplasty with stent placement      2011, 02/2012  . Tonsillectomy    . Coronary artery bypass graft     Family History  Problem Relation Age of Onset  . Early death      Parents died young  . Appendicitis Mother     Pt was 9 year old  . Heart attack Father 5   History  Substance Use Topics  . Smoking status: Former Smoker -- 2.00 packs/day for 10 years    Types: Cigarettes    Quit date: 07/14/1961  . Smokeless tobacco: Current User    Types: Chew  . Alcohol Use: No    Review of Systems  Constitutional: Negative for appetite change and fatigue.  HENT: Negative for congestion, sinus pressure and ear discharge.   Eyes: Negative for discharge.  Respiratory: Negative for cough.   Cardiovascular: Negative for chest pain.  Gastrointestinal: Positive for abdominal pain. Negative for diarrhea.  Genitourinary: Negative for frequency and hematuria.  Musculoskeletal: Positive for joint swelling. Negative for back pain.  Skin: Negative for rash.  Neurological: Positive for dizziness. Negative for seizures and headaches.  Psychiatric/Behavioral: Negative for hallucinations.    Allergies  Review of patient's allergies indicates no known allergies.  Home Medications   Current Outpatient Rx  Name  Route  Sig  Dispense  Refill  . calcium carbonate (TUMS - DOSED IN MG ELEMENTAL CALCIUM) 500 MG chewable tablet   Oral   Chew 1 tablet by mouth 4 (four) times daily - after meals and at bedtime.         Tery Sanfilippo Sodium (STOOL SOFTENER) 100 MG capsule   Oral   Take 200 mg by mouth 2 (two) times daily. Constipation         . ferrous sulfate 325 (65 FE) MG tablet   Oral   Take 325 mg by mouth daily with breakfast.          . losartan (COZAAR) 100 MG tablet   Oral   Take 1 tablet (100 mg total) by mouth daily.   30 tablet   6   . metFORMIN (GLUCOPHAGE) 500 MG tablet   Oral   Take 500 mg by mouth 2 (two) times daily. MAY TAKE 1 1/2 TAB IF NEEDED, PT ADJUSTS THIS MED HIMSELF         . Multiple Vitamin (MULTIVITAMIN WITH MINERALS) TABS   Oral   Take 1 tablet by mouth daily.         . nitroGLYCERIN (NITROSTAT) 0.4 MG SL tablet   Sublingual   Place 1 tablet (0.4 mg total) under the tongue every 5 (five) minutes x 3 doses as needed for chest pain.   25 tablet   3   . pantoprazole (PROTONIX) 20 MG tablet               . sulfamethoxazole-trimethoprim (BACTRIM,SEPTRA) 400-80 MG per tablet               . tamsulosin (FLOMAX) 0.4 MG CAPS               . Ticagrelor (BRILINTA) 90 MG TABS tablet   Oral   Take 1 tablet (90 mg total) by mouth 2 (two) times daily.   60 tablet   6    Triage Vitals: BP 141/74  Pulse 92  Temp(Src) 98.3 F (36.8 C) (Oral)  Resp 16  Ht 5\' 10"  (1.778 m)  Wt 180 lb (81.647 kg)  BMI 25.83 kg/m2  SpO2 100%  Physical Exam  Constitutional: He is oriented to person, place, and time. He appears well-developed.  HENT:  Head: Normocephalic.  Eyes: Conjunctivae and EOM are normal. No scleral icterus.  Neck: Neck supple. No thyromegaly present.  Cardiovascular: Normal rate, regular rhythm and normal heart sounds.  Exam reveals no gallop and no friction rub.   No murmur heard. Pulmonary/Chest: No stridor. He has no wheezes. He has no rales. He exhibits no tenderness.  Abdominal: Soft. He exhibits no distension. There is no tenderness. There is no rebound.  Musculoskeletal: Normal range of motion. He exhibits edema.  1+ edema to the bilateral feet  Lymphadenopathy:    He has no cervical adenopathy.  Neurological: He is oriented to person, place, and time. He exhibits normal muscle tone. Coordination normal.  Skin: No rash noted. No erythema.   Psychiatric: He has a normal mood and affect. His behavior is normal.    ED Course  Procedures (including critical care time)  DIAGNOSTIC STUDIES: Oxygen Saturation is 100% on room air, normal by my interpretation.    COORDINATION OF CARE: 1:13PM- Will order blood labs and a chest x-ray. Discussed treatment plan with patient at  bedside and patient verbalized agreement.     Labs Review Labs Reviewed  CBC WITH DIFFERENTIAL - Abnormal; Notable for the following:    RBC 4.09 (*)    Hemoglobin 12.7 (*)    HCT 37.8 (*)    All other components within normal limits  COMPREHENSIVE METABOLIC PANEL - Abnormal; Notable for the following:    Glucose, Bld 208 (*)    Total Bilirubin 0.2 (*)    GFR calc non Af Amer 76 (*)    GFR calc Af Amer 89 (*)    All other components within normal limits  TROPONIN I   Imaging Review Dg Chest 2 View  04/17/2013   CLINICAL DATA:  Leg swelling.  EXAM: CHEST  2 VIEW  COMPARISON:  11/26/2012 and 06/23/2012  FINDINGS: Lungs are clear. Cardiomediastinal silhouette and remainder of the exam is unchanged.  IMPRESSION: No active cardiopulmonary disease.   Electronically Signed   By: Elberta Fortis M.D.   On: 04/17/2013 13:55    Date: 04/17/2013  Rate: 87  Rhythm: normal sinus rhythm  QRS Axis: normal  Intervals: normal  ST/T Wave abnormalities: nonspecific T wave changes  Conduction Disutrbances:none  Narrative Interpretation:   Old EKG Reviewed: unchanged   MDM  No diagnosis found.   The chart was scribed for me under my direct supervision.  I personally performed the history, physical, and medical decision making and all procedures in the evaluation of this patient.[name], MD 04/17/13 [phone]

## 2013-04-27 ENCOUNTER — Ambulatory Visit (INDEPENDENT_AMBULATORY_CARE_PROVIDER_SITE_OTHER): Payer: Medicare Other | Admitting: Adult Health

## 2013-04-27 ENCOUNTER — Encounter: Payer: Self-pay | Admitting: Adult Health

## 2013-04-27 ENCOUNTER — Telehealth: Payer: Self-pay | Admitting: Adult Health

## 2013-04-27 VITALS — BP 149/80 | HR 88 | Ht 70.0 in | Wt 187.0 lb

## 2013-04-27 DIAGNOSIS — I2589 Other forms of chronic ischemic heart disease: Secondary | ICD-10-CM

## 2013-04-27 DIAGNOSIS — R0602 Shortness of breath: Secondary | ICD-10-CM

## 2013-04-27 DIAGNOSIS — I10 Essential (primary) hypertension: Secondary | ICD-10-CM

## 2013-04-27 DIAGNOSIS — Z91199 Patient's noncompliance with other medical treatment and regimen due to unspecified reason: Secondary | ICD-10-CM

## 2013-04-27 DIAGNOSIS — Z9119 Patient's noncompliance with other medical treatment and regimen: Secondary | ICD-10-CM

## 2013-04-27 DIAGNOSIS — E119 Type 2 diabetes mellitus without complications: Secondary | ICD-10-CM

## 2013-04-27 DIAGNOSIS — I255 Ischemic cardiomyopathy: Secondary | ICD-10-CM

## 2013-04-27 DIAGNOSIS — E785 Hyperlipidemia, unspecified: Secondary | ICD-10-CM

## 2013-04-27 DIAGNOSIS — I7090 Unspecified atherosclerosis: Secondary | ICD-10-CM

## 2013-04-27 DIAGNOSIS — D649 Anemia, unspecified: Secondary | ICD-10-CM

## 2013-04-27 DIAGNOSIS — I2510 Atherosclerotic heart disease of native coronary artery without angina pectoris: Secondary | ICD-10-CM

## 2013-04-27 MED ORDER — POTASSIUM CHLORIDE CRYS ER 20 MEQ PO TBCR: 20.0000 meq | EXTENDED_RELEASE_TABLET | Freq: Every day | ORAL | Status: DC

## 2013-04-27 MED ORDER — FUROSEMIDE 20 MG PO TABS: 20.0000 mg | ORAL_TABLET | Freq: Every day | ORAL | Status: DC

## 2013-04-27 NOTE — Progress Notes (Signed)
HPI:[name] is an 79 year old patient of Dr. Tenny Craw we are following for ongoing assessment and management of CAD, hypertension, ischemic heart myopathy. Patient has history of prior coronary artery bypass grafting with PCI. He was last seen in the office in July 2014 prior to having urologic procedure which involves clipping of some tissue around his urethra due to an efficient bladder emptying. The patient was told to postpone his procedure until March of 2015 as he will need to have a coagulation therapy for a minimum of one year. Is no evidence of fluid overload on last visit.   The patient unfortunately was seen in the emergency room on 04/17/2013 with complaints of GERD-like symptoms. Also had some mild abdominal pain.    He comes today 14lbs heavier since July of 2014, 7 lbs heavier than 10 days ago. He admits to eating salty foods and adding salt. Last EF was 45%-50%. Grade I diastolic dysfunction. He is slightly short of breath when he is exerting himself.   No Known Allergies  Current Outpatient Prescriptions  Medication Sig Dispense Refill  . ferrous sulfate 325 (65 FE) MG tablet Take 325 mg by mouth daily with breakfast.      . loperamide (IMODIUM) 2 MG capsule       . metFORMIN (GLUCOPHAGE) 500 MG tablet Take 500 mg by mouth 2 (two) times daily. MAY TAKE 1 1/2 TAB IF NEEDED, PT ADJUSTS THIS MED HIMSELF      . Multiple Vitamin (MULTIVITAMIN WITH MINERALS) TABS Take 1 tablet by mouth daily.      . nitroGLYCERIN (NITROSTAT) 0.4 MG SL tablet Place 1 tablet (0.4 mg total) under the tongue every 5 (five) minutes x 3 doses as needed for chest pain.  25 tablet  3  . pantoprazole (PROTONIX) 20 MG tablet Take 20 mg by mouth.       Marland Kitchen PROAIR HFA 108 (90 BASE) MCG/ACT inhaler Inhale 2 puffs into the lungs every 4 (four) hours as needed for wheezing or shortness of breath.       . tamsulosin (FLOMAX) 0.4 MG CAPS Take 0.4 mg by mouth daily after supper.       . Ticagrelor (BRILINTA) 90 MG TABS  tablet Take 1 tablet (90 mg total) by mouth 2 (two) times daily.  60 tablet  6   No current facility-administered medications for this visit.    Past Medical History  Diagnosis Date  . Diabetes mellitus, type II   . Hyperlipidemia     Lipid profile in 02/2012:135, 227, 41, 49  . Arteriosclerotic cardiovascular disease (ASCVD)     a. CABG x 4 in 1989 (VG->OM1->OM2, VG->RCA, LIMA->LAD), b. 05/2010: DES to VG-OM1/OM2, DES to distal LCx. c. NSTEMI in 04/2011 - TO distal LCX stent and VG->OM2. d. 02/2012 NSTEMI DES to VG-OM1/continuation to OM2 occluded. e. inferior STEMI s/p DES to SVG-RAMUS 06/2012. f. inferolat STEMI 09/2012 s/p DES to SVG-interm.  . Hypertension   . Osteoarthritis   . CVA (cerebral infarction)     details unclear. pt reports light stroke last year involving L leg. No imaging to suggest hemorrhagic etiology  . Pneumonia   . Cervical vertebral fracture   . Chronic back pain   . Benign prostatic hypertrophy     Required urinary catheter x4 weeks for retention  . Peptic ulcer disease   . Gastroesophageal reflux disease   . Ischemic cardiomyopathy     EF 45-50% 3/32014  . DM (diabetes mellitus)   . Anemia  Past Surgical History  Procedure Laterality Date  . Coronary angioplasty with stent placement      2011, 02/2012  . Tonsillectomy    . Coronary artery bypass graft      VHQ:IONGEX of systems complete and found to be negative unless listed above  PHYSICAL EXAM BP 149/80  Pulse 88  Ht 5\' 10"  (1.778 m)  Wt 187 lb (84.823 kg)  BMI 26.83 kg/m2  General: Well developed, well nourished, in no acute distress Head: Eyes PERRLA, No xanthomas.   Normal cephalic and atramatic  Lungs: Clear bilaterally to auscultation and percussion. Heart: HRRR S1 S2, without MRG.  Pulses are 2+ & equal.            No carotid bruit. No JVD.  No abdominal bruits. No femoral bruits. Abdomen: Bowel sounds are positive, slightly distended,  abdomen soft and non-tender without masses or                   Hernia's noted. Msk:  Back normal, normal gait. Normal strength and tone for age. Extremities: No clubbing, cyanosis 2+ pitting pre-tibial edema.  DP +1 Neuro: Alert and oriented X 3. Psych:  Good affect, responds appropriately    ASSESSMENT AND PLAN

## 2013-04-27 NOTE — Assessment & Plan Note (Signed)
He is not adhering to low sodium diet. I have reinforced this to him. He is to weigh daily.

## 2013-04-27 NOTE — Assessment & Plan Note (Signed)
Stable. Most recent lab work last week. Continues on iron replacement.

## 2013-04-27 NOTE — Assessment & Plan Note (Signed)
He will continue with ASA and Brilinta until March of 2015.

## 2013-04-27 NOTE — Patient Instructions (Signed)
Your physician recommends that you schedule a follow-up appointment in: 1 week with Joni Reining, NP   Your physician has recommended you make the following change in your medication:  1. Take Lasix 20 mg  with Potassium 20 Meq daily for three days then stop.  Your physician recommends that you return for lab work in one week. Please complete labs before next visit.  Your physician has requested that you have an echocardiogram. Echocardiography is a painless test that uses sound waves to create images of your heart. It provides your doctor with information about the size and shape of your heart and how well your heart's chambers and valves are working. This procedure takes approximately one hour. There are no restrictions for this procedure.

## 2013-04-27 NOTE — Telephone Encounter (Signed)
Patient's new RX's were sent to wrong pharmacy / they were sent to [facility] / patient states that he has been using [place] for over a year now / please correct and call patient / tgs

## 2013-04-27 NOTE — Assessment & Plan Note (Signed)
He is no longer on statin. Will need to check fasting levels in a few weeks.

## 2013-04-27 NOTE — Progress Notes (Deleted)
Name: Jeffrey [name]    DOB: [birth year]  Age: 79 y.o.  MR#: [id number]       PCP:  [name], MD      Insurance: Payor: MEDICARE / Plan: MEDICARE PART A AND B / Product Type: *No Product type* /   CC:    Chief Complaint  Patient presents with  . Cardiomyopathy    Ischemic  . Hypertension  ankle swelling  VS Filed Vitals:   04/27/13 1318  BP: 149/80  Pulse: 88  Height: 5\' 10"  (1.778 m)  Weight: 187 lb (84.823 kg)    Weights Current Weight  04/27/13 187 lb (84.823 kg)  04/17/13 180 lb (81.647 kg)  01/28/13 173 lb (78.472 kg)    Blood Pressure  BP Readings from Last 3 Encounters:  04/27/13 149/80  04/17/13 143/75  01/28/13 122/69     Admit date:  (Not on file) Last encounter with RMR:  03/24/2013   Allergy Review of patient's allergies indicates no known allergies.  Current Outpatient Prescriptions  Medication Sig Dispense Refill  . ferrous sulfate 325 (65 FE) MG tablet Take 325 mg by mouth daily with breakfast.      . loperamide (IMODIUM) 2 MG capsule       . metFORMIN (GLUCOPHAGE) 500 MG tablet Take 500 mg by mouth 2 (two) times daily. MAY TAKE 1 1/2 TAB IF NEEDED, PT ADJUSTS THIS MED HIMSELF      . Multiple Vitamin (MULTIVITAMIN WITH MINERALS) TABS Take 1 tablet by mouth daily.      . nitroGLYCERIN (NITROSTAT) 0.4 MG SL tablet Place 1 tablet (0.4 mg total) under the tongue every 5 (five) minutes x 3 doses as needed for chest pain.  25 tablet  3  . pantoprazole (PROTONIX) 20 MG tablet Take 20 mg by mouth.       Marland Kitchen PROAIR HFA 108 (90 BASE) MCG/ACT inhaler Inhale 2 puffs into the lungs every 4 (four) hours as needed for wheezing or shortness of breath.       . tamsulosin (FLOMAX) 0.4 MG CAPS Take 0.4 mg by mouth daily after supper.       . Ticagrelor (BRILINTA) 90 MG TABS tablet Take 1 tablet (90 mg total) by mouth 2 (two) times daily.  60 tablet  6   No current facility-administered medications for this visit.    Discontinued Meds:    Medications  Discontinued During This Encounter  Medication Reason  . diphenoxylate-atropine (LOMOTIL) 2.5-0.025 MG per tablet Error  . sulfamethoxazole-trimethoprim (BACTRIM,SEPTRA) 400-80 MG per tablet Error    Patient Active Problem List   Diagnosis Date Noted  . Noncompliance 11/22/2012  . Anemia, normocytic normochromic   . Cardiomyopathy, ischemic 06/21/2012  . Cerebrovascular disease 06/08/2012  . Diabetes mellitus, type II   . Hypertension   . Arteriosclerotic cardiovascular disease (ASCVD) 10/25/2011  . Hyperlipidemia 10/24/2011  . BPH (benign prostatic hyperplasia) 05/07/2011    LABS    Component Value Date/Time   NA 135 04/17/2013 1320   NA 134* 11/29/2012 0933   NA 134* 11/27/2012 0640   K 3.8 04/17/2013 1320   K 4.4 11/29/2012 0933   K 4.1 11/27/2012 0640   CL 97 04/17/2013 1320   CL 96 11/29/2012 0933   CL 100 11/27/2012 0640   CO2 23 04/17/2013 1320   CO2 22 11/29/2012 0933   CO2 23 11/27/2012 0640   GLUCOSE 208* 04/17/2013 1320   GLUCOSE 216* 11/29/2012 0933   GLUCOSE 194* 11/27/2012 0640   BUN  12 04/17/2013 1320   BUN 14 11/29/2012 0933   BUN 10 11/27/2012 0640   CREATININE 0.92 04/17/2013 1320   CREATININE 1.05 11/29/2012 0933   CREATININE 0.79 11/27/2012 0640   CREATININE 0.98 12/04/2011 1208   CALCIUM 9.6 04/17/2013 1320   CALCIUM 10.4 11/29/2012 0933   CALCIUM 9.0 11/27/2012 0640   GFRNONAA 76* 04/17/2013 1320   GFRNONAA 64* 11/29/2012 0933   GFRNONAA 82* 11/27/2012 0640   GFRAA 89* 04/17/2013 1320   GFRAA 75* 11/29/2012 0933   GFRAA >90 11/27/2012 0640   CMP     Component Value Date/Time   NA 135 04/17/2013 1320   K 3.8 04/17/2013 1320   CL 97 04/17/2013 1320   CO2 23 04/17/2013 1320   GLUCOSE 208* 04/17/2013 1320   BUN 12 04/17/2013 1320   CREATININE 0.92 04/17/2013 1320   CREATININE 0.98 12/04/2011 1208   CALCIUM 9.6 04/17/2013 1320   PROT 6.7 04/17/2013 1320   ALBUMIN 3.7 04/17/2013 1320   AST 32 04/17/2013 1320   ALT 29 04/17/2013 1320   ALKPHOS 87 04/17/2013 1320   BILITOT  0.2* 04/17/2013 1320   GFRNONAA 76* 04/17/2013 1320   GFRAA 89* 04/17/2013 1320       Component Value Date/Time   WBC 6.7 04/17/2013 1320   WBC 8.3 11/29/2012 0933   WBC 7.7 11/28/2012 1405   HGB 12.7* 04/17/2013 1320   HGB 12.7* 11/29/2012 0933   HGB 11.4* 11/28/2012 1405   HCT 37.8* 04/17/2013 1320   HCT 39.1 11/29/2012 0933   HCT 34.9* 11/28/2012 1405   MCV 92.4 04/17/2013 1320   MCV 85.4 11/29/2012 0933   MCV 85.7 11/28/2012 1405    Lipid Panel     Component Value Date/Time   CHOL 124 10/07/2012 0700   TRIG 211* 10/07/2012 0700   HDL 31* 10/07/2012 0700   CHOLHDL 4.0 10/07/2012 0700   VLDL 42* 10/07/2012 0700   LDLCALC 51 10/07/2012 0700    ABG    Component Value Date/Time   TCO2 23 10/06/2012 1545     Lab Results  Component Value Date   TSH 3.483 10/16/2012   BNP (last 3 results)  Recent Labs  07/29/12 1340 07/29/12 1855  PROBNP 863.6* 845.2*   Cardiac Panel (last 3 results) No results found for this basename: CKTOTAL, CKMB, TROPONINI, RELINDX,  in the last 72 hours  Iron/TIBC/Ferritin No results found for this basename: iron, tibc, ferritin     EKG Orders placed during the hospital encounter of 04/17/13  . EKG 12-LEAD  . EKG 12-LEAD  . EKG     Prior Assessment and Plan Problem List as of 04/27/2013     Cardiovascular and Mediastinum   Arteriosclerotic cardiovascular disease (ASCVD)   Last Assessment & Plan   01/28/2013 Office Visit Written 01/28/2013  6:53 PM by Jodelle Gross, NP     It is recommended the patient not have any elective procedures unless he has been on Brilintal for a minimum of one year. He has multiple stents placed with most recent PCI completed in March of 2014. Would recommend waiting until after March of 2015 to procedure completed unless it is an emergent situation. The patient does now wish to proceed with cystoscopy unless he is symptomatic. He states now he is able to urinate and has no issues with incontinence or healing  full.  Otherwise the patient is asymptomatic from a cardiac standpoint. We will continue his current medication regimen. No changes at this time.  Hypertension   Last Assessment & Plan   01/28/2013 Office Visit Written 01/28/2013  6:55 PM by Jodelle Gross, NP     Currently well controlled. No changes in medication. He remains medically and dietary compliant.    Cerebrovascular disease   Last Assessment & Plan   09/09/2012 Office Visit Written 09/09/2012  3:42 PM by Kathlen Brunswick, MD     Recent carotid ultrasound negative for obstruction. Optimal control of cardiovascular risk factors appears effective in preventing progression of disease and will be continued.    Cardiomyopathy, ischemic   Last Assessment & Plan   01/28/2013 Office Visit Written 01/28/2013  6:54 PM by Jodelle Gross, NP     He is without complaints of fluid retention or dyspnea on exertion. He is a resident of [facility], and has generalized fatigue chronically. He is becoming more active at this facility. In fact he is extended his stay another 30 days. He appears to be well compensated. No changes in his medications.      Endocrine   Diabetes mellitus, type II   Last Assessment & Plan   09/09/2012 Office Visit Written 09/09/2012  3:38 PM by Kathlen Brunswick, MD     Probably requires increased therapy for diabetes. Patient will discuss this with Dr. Sherwood Gambler at his next visit.      Genitourinary   BPH (benign prostatic hyperplasia)   Last Assessment & Plan   09/09/2012 Office Visit Edited 09/11/2012 11:21 PM by Kathlen Brunswick, MD     Patient advised not to increase his dose of Flomax or other medications without specific instructions and will resume his usual daily dose.  He is scheduled to return to see Dr. Jerre Simon in the near future for catheter removal.      Other   Hyperlipidemia   Last Assessment & Plan   11/18/2012 Office Visit Written 12/14/2012 10:38 AM by Kathlen Brunswick,  MD     Excellent control of total and LDL cholesterol with current therapy, which will be continued. Triglyceride level and HDL would likely improve with better control of diabetes.    Anemia, normocytic normochromic   Noncompliance       Imaging: Dg Chest 2 View  04/17/2013   CLINICAL DATA:  Leg swelling.  EXAM: CHEST  2 VIEW  COMPARISON:  11/26/2012 and 06/23/2012  FINDINGS: Lungs are clear. Cardiomediastinal silhouette and remainder of the exam is unchanged.  IMPRESSION: No active cardiopulmonary disease.   Electronically Signed   By: Elberta Fortis M.D.   On: 04/17/2013 13:55

## 2013-04-27 NOTE — Assessment & Plan Note (Signed)
BP is elevated today with some fluid overload noted. He is no longer on antihypertensive medications as he states that he was having syncope and hypotension.

## 2013-04-27 NOTE — Assessment & Plan Note (Signed)
Followed by PCP

## 2013-04-27 NOTE — Assessment & Plan Note (Signed)
I will have echo complete to evaluate his EF. I have started him on lasix 20 mg daily with potassium for the next 3 days, then stop. He may need to be on every other day dosing. He is to be on low sodium, diet and not add salt to his foods, He appears to be dyspneic at rest with talking. I will check a BMET and Pro-BNP today. Follow up in one week.

## 2013-04-28 MED ORDER — FUROSEMIDE 20 MG PO TABS: 20.0000 mg | ORAL_TABLET | Freq: Every day | ORAL | Status: DC

## 2013-04-28 MED ORDER — POTASSIUM CHLORIDE CRYS ER 20 MEQ PO TBCR: 20.0000 meq | EXTENDED_RELEASE_TABLET | Freq: Every day | ORAL | Status: DC

## 2013-04-28 NOTE — Telephone Encounter (Signed)
Medication sent via escribe.  

## 2013-04-29 LAB — BASIC METABOLIC PANEL
BUN: 14 mg/dL (ref 6–23)
CO2: 24 mEq/L (ref 19–32)
Chloride: 100 mEq/L (ref 96–112)
Glucose, Bld: 159 mg/dL — ABNORMAL HIGH (ref 70–99)
Potassium: 4.5 mEq/L (ref 3.5–5.3)

## 2013-05-02 ENCOUNTER — Ambulatory Visit (HOSPITAL_COMMUNITY)
Admission: RE | Admit: 2013-05-02 | Discharge: 2013-05-02 | Disposition: A | Discharge status: Home / Self Care | Payer: Medicare Other | Source: Ambulatory Visit | Attending: Adult Health | Admitting: Adult Health

## 2013-05-02 DIAGNOSIS — E119 Type 2 diabetes mellitus without complications: Secondary | ICD-10-CM | POA: Insufficient documentation

## 2013-05-02 DIAGNOSIS — I10 Essential (primary) hypertension: Secondary | ICD-10-CM | POA: Insufficient documentation

## 2013-05-02 DIAGNOSIS — Z8673 Personal history of transient ischemic attack (TIA), and cerebral infarction without residual deficits: Secondary | ICD-10-CM | POA: Insufficient documentation

## 2013-05-02 DIAGNOSIS — I2589 Other forms of chronic ischemic heart disease: Secondary | ICD-10-CM | POA: Insufficient documentation

## 2013-05-02 DIAGNOSIS — I519 Heart disease, unspecified: Secondary | ICD-10-CM

## 2013-05-02 DIAGNOSIS — I255 Ischemic cardiomyopathy: Secondary | ICD-10-CM

## 2013-05-02 DIAGNOSIS — R0602 Shortness of breath: Secondary | ICD-10-CM

## 2013-05-02 NOTE — Progress Notes (Signed)
*  PRELIMINARY RESULTS* Echocardiogram 2D Echocardiogram has been performed.  Jeffrey [name] 05/02/2013, 12:24 PM

## 2013-05-06 ENCOUNTER — Ambulatory Visit (INDEPENDENT_AMBULATORY_CARE_PROVIDER_SITE_OTHER): Payer: Medicare Other | Admitting: Adult Health

## 2013-05-06 ENCOUNTER — Encounter: Payer: Self-pay | Admitting: Adult Health

## 2013-05-06 VITALS — BP 116/71 | HR 95 | Ht 70.0 in | Wt 182.0 lb

## 2013-05-06 DIAGNOSIS — I7090 Unspecified atherosclerosis: Secondary | ICD-10-CM

## 2013-05-06 DIAGNOSIS — I255 Ischemic cardiomyopathy: Secondary | ICD-10-CM

## 2013-05-06 DIAGNOSIS — I2510 Atherosclerotic heart disease of native coronary artery without angina pectoris: Secondary | ICD-10-CM

## 2013-05-06 DIAGNOSIS — E785 Hyperlipidemia, unspecified: Secondary | ICD-10-CM

## 2013-05-06 DIAGNOSIS — I2589 Other forms of chronic ischemic heart disease: Secondary | ICD-10-CM

## 2013-05-06 DIAGNOSIS — I10 Essential (primary) hypertension: Secondary | ICD-10-CM

## 2013-05-06 DIAGNOSIS — R0602 Shortness of breath: Secondary | ICD-10-CM

## 2013-05-06 MED ORDER — FUROSEMIDE 20 MG PO TABS: ORAL_TABLET | ORAL | Status: DC

## 2013-05-06 NOTE — Assessment & Plan Note (Signed)
Improved EF per echo this month to 50%. No further evidence of fluid retention. He is breathing better without edema and is down 5 lbs. I have asked him to weigh himself daily. If he gain 3 lbs in 24 hours. He is to take one dose of lasix with potasium that day. He is not to take it on a daily basis. He is reminded of low sodium diet. We will see him in 6 months.

## 2013-05-06 NOTE — Progress Notes (Signed)
HPI: Jeffrey [name] is an [age]-year-old patient of Dr. Dietrich Pates,  We are following for ongoing assessment and management of CAD, hypertension, and ischemic  cardiomyopathy. He was last seen in the office one week ago with increased lower extremity edema, 14 pound weight gain, as to which has occurred over the last week up 10 pounds, and dyspnea. Last visit the patient was started on Lasix 20 mg daily with potassium strength 3 days after that and then stop. Echocardiogram was completed, BMET and pro BNP was also ordered.   Pro BNP dated 04/27/2013, ALT was 1023. Echocardiogram revealed slightly reduced EF of 50%, with evidence of diastolic dysfunction, grade 1. Sodium was 138, potassium 4.5, chloride 100, CO2 24, BUN 14, creatinine 1.19.    He is feeling better now with diureses., He denies swelling of his legs. Some fatigue.   No Known Allergies  Current Outpatient Prescriptions  Medication Sig Dispense Refill  . ferrous sulfate 325 (65 FE) MG tablet Take 325 mg by mouth daily with breakfast.      . furosemide (LASIX) 20 MG tablet Take 1 tablet as needed if 3 lb weight gain.  30 tablet  2  . loperamide (IMODIUM) 2 MG capsule       . metFORMIN (GLUCOPHAGE) 500 MG tablet Take 500 mg by mouth 2 (two) times daily. MAY TAKE 1 1/2 TAB IF NEEDED, PT ADJUSTS THIS MED HIMSELF      . Multiple Vitamin (MULTIVITAMIN WITH MINERALS) TABS Take 1 tablet by mouth daily.      . nitroGLYCERIN (NITROSTAT) 0.4 MG SL tablet Place 1 tablet (0.4 mg total) under the tongue every 5 (five) minutes x 3 doses as needed for chest pain.  25 tablet  3  . pantoprazole (PROTONIX) 20 MG tablet Take 20 mg by mouth.       . potassium chloride SA (K-DUR,KLOR-CON) 20 MEQ tablet Take 1 tablet (20 mEq total) by mouth daily.  30 tablet  6  . PROAIR HFA 108 (90 BASE) MCG/ACT inhaler Inhale 2 puffs into the lungs every 4 (four) hours as needed for wheezing or shortness of breath.       . tamsulosin (FLOMAX) 0.4 MG CAPS Take 0.4 mg by  mouth daily after supper.       . Ticagrelor (BRILINTA) 90 MG TABS tablet Take 1 tablet (90 mg total) by mouth 2 (two) times daily.  60 tablet  6   No current facility-administered medications for this visit.    Past Medical History  Diagnosis Date  . Diabetes mellitus, type II   . Hyperlipidemia     Lipid profile in 02/2012:135, 227, 41, 49  . Arteriosclerotic cardiovascular disease (ASCVD)     a. CABG x 4 in 1989 (VG->OM1->OM2, VG->RCA, LIMA->LAD), b. 05/2010: DES to VG-OM1/OM2, DES to distal LCx. c. NSTEMI in 04/2011 - TO distal LCX stent and VG->OM2. d. 02/2012 NSTEMI DES to VG-OM1/continuation to OM2 occluded. e. inferior STEMI s/p DES to SVG-RAMUS 06/2012. f. inferolat STEMI 09/2012 s/p DES to SVG-interm.  . Hypertension   . Osteoarthritis   . CVA (cerebral infarction)     details unclear. pt reports light stroke last year involving L leg. No imaging to suggest hemorrhagic etiology  . Pneumonia   . Cervical vertebral fracture   . Chronic back pain   . Benign prostatic hypertrophy     Required urinary catheter x4 weeks for retention  . Peptic ulcer disease   . Gastroesophageal reflux disease   .  Ischemic cardiomyopathy     EF 45-50% 3/32014  . DM (diabetes mellitus)   . Anemia     Past Surgical History  Procedure Laterality Date  . Coronary angioplasty with stent placement      2011, 02/2012  . Tonsillectomy    . Coronary artery bypass graft      ZOX:WRUEAV of systems complete and found to be negative unless listed above  PHYSICAL EXAM BP 116/71  Pulse 95  Ht 5\' 10"  (1.778 m)  Wt 182 lb (82.555 kg)  BMI 26.11 kg/m2  General: Well developed, well nourished, in no acute distress Head: Eyes PERRLA, No xanthomas.   Normal cephalic and atramatic  Lungs: Bibasilar crackles without wheezes or productive coughing. Heart: HRRR S1 S2, without MRG.  Pulses are 2+ & equal.            No carotid bruit. No JVD.  No abdominal bruits. No femoral bruits. Abdomen: Bowel sounds are  positive, obese, abdomen soft and non-tender without masses or                  Hernia's noted. Msk:  Back normal, normal gait. Normal strength and tone for age. Extremities: No clubbing, cyanosis or edema.  DP +1 Neuro: Alert and oriented X 3.Very HOH Psych:  Good affect, responds appropriately    ASSESSMENT AND PLAN

## 2013-05-06 NOTE — Assessment & Plan Note (Signed)
Blood pressure is low normal. He is not symptomatic. Will continue him on current medical regimen.  Follow up in 6 months.

## 2013-05-06 NOTE — Assessment & Plan Note (Signed)
Will need to have follow up lipids and LFT's in 3 months.

## 2013-05-06 NOTE — Assessment & Plan Note (Signed)
He is without cardiac complaint with the exception of generalized fatigue. He will continue on Brilinta and ASA daily. Ongoing medication management.

## 2013-05-06 NOTE — Patient Instructions (Signed)
Your physician recommends that you schedule a follow-up appointment in: 6 months with Dr Reggy Eye will receive a reminder letter two months in advance reminding you to call and schedule your appointment. If you don't receive this letter, please contact our office.  Your physician has recommended you make the following change in your medication:  1. Use lasix 20 mg as needed for 3 lb weight gain with potassium.  Your physician recommends that you weigh, daily, at the same time every day, and in the same amount of clothing. Please record your daily weights on the handout provided and bring it to your next appointment.   2 Gram Low Sodium Diet A 2 gram sodium diet restricts the amount of sodium in the diet to no more than 2 g or 2000 mg daily. Limiting the amount of sodium is often used to help lower blood pressure. It is important if you have heart, liver, or kidney problems. Many foods contain sodium for flavor and sometimes as a preservative. When the amount of sodium in a diet needs to be low, it is important to know what to look for when choosing foods and drinks. The following includes some information and guidelines to help make it easier for you to adapt to a low sodium diet. QUICK TIPS  Do not add salt to food.  Avoid convenience items and fast food.  Choose unsalted snack foods.  Buy lower sodium products, often labeled as "lower sodium" or "no salt added."  Check food labels to learn how much sodium is in 1 serving.  When eating at a restaurant, ask that your food be prepared with less salt or none, if possible. READING FOOD LABELS FOR SODIUM INFORMATION The nutrition facts label is a good place to find how much sodium is in foods. Look for products with no more than 500 to 600 mg of sodium per meal and no more than 150 mg per serving. Remember that 2 g = 2000 mg. The food label may also list foods as:  Sodium-free: Less than 5 mg in a serving.  Very low sodium: 35 mg or  less in a serving.  Low-sodium: 140 mg or less in a serving.  Light in sodium: 50% less sodium in a serving. For example, if a food that usually has 300 mg of sodium is changed to become light in sodium, it will have 150 mg of sodium.  Reduced sodium: 25% less sodium in a serving. For example, if a food that usually has 400 mg of sodium is changed to reduced sodium, it will have 300 mg of sodium. CHOOSING FOODS Grains  Avoid: Salted crackers and snack items. Some cereals, including instant hot cereals. Bread stuffing and biscuit mixes. Seasoned rice or pasta mixes.  Choose: Unsalted snack items. Low-sodium cereals, oats, puffed wheat and rice, shredded wheat. English muffins and bread. Pasta. Meats  Avoid: Salted, canned, smoked, spiced, pickled meats, including fish and poultry. Bacon, ham, sausage, cold cuts, hot dogs, anchovies.  Choose: Low-sodium canned tuna and salmon. Fresh or frozen meat, poultry, and fish. Dairy  Avoid: Processed cheese and spreads. Cottage cheese. Buttermilk and condensed milk. Regular cheese.  Choose: Milk. Low-sodium cottage cheese. Yogurt. Sour cream. Low-sodium cheese. Fruits and Vegetables  Avoid: Regular canned vegetables. Regular canned tomato sauce and paste. Frozen vegetables in sauces. Olives. Rosita Fire. Relishes. Sauerkraut.  Choose: Low-sodium canned vegetables. Low-sodium tomato sauce and paste. Frozen or fresh vegetables. Fresh and frozen fruit. Condiments  Avoid: Canned and packaged gravies. Worcestershire  sauce. Tartar sauce. Barbecue sauce. Soy sauce. Steak sauce. Ketchup. Onion, garlic, and table salt. Meat flavorings and tenderizers.  Choose: Fresh and dried herbs and spices. Low-sodium varieties of mustard and ketchup. Lemon juice. Tabasco sauce. Horseradish. SAMPLE 2 GRAM SODIUM MEAL PLAN Breakfast / Sodium (mg)  1 cup low-fat milk / 143 mg  2 slices whole-wheat toast / 270 mg  1 tbs heart-healthy margarine / 153 mg  1  hard-boiled egg / 139 mg  1 small orange / 0 mg Lunch / Sodium (mg)  1 cup raw carrots / 76 mg   cup hummus / 298 mg  1 cup low-fat milk / 143 mg   cup red grapes / 2 mg  1 whole-wheat pita bread / 356 mg Dinner / Sodium (mg)  1 cup whole-wheat pasta / 2 mg  1 cup low-sodium tomato sauce / 73 mg  3 oz lean ground beef / 57 mg  1 small side salad (1 cup raw spinach leaves,  cup cucumber,  cup yellow bell pepper) with 1 tsp olive oil and 1 tsp red wine vinegar / 25 mg Snack / Sodium (mg)  1 container low-fat vanilla yogurt / 107 mg  3 graham cracker squares / 127 mg Nutrient Analysis  Calories: 2033  Protein: 77 g  Carbohydrate: 282 g  Fat: 72 g  Sodium: 1971 mg Document Released: 06/30/2005 Document Revised: 09/22/2011 Document Reviewed: 10/01/2009 Exit[employer] Patient Information 2014 [place], [place].

## 2013-05-06 NOTE — Progress Notes (Deleted)
Name: Jeffrey [name]    DOB: 02-12-1929  Age: 79 y.o.  MR#: [id number]       PCP:  [name], MD      Insurance: Payor: MEDICARE / Plan: MEDICARE PART A AND B / Product Type: *No Product type* /   CC:    Chief Complaint  Patient presents with  . Hypertension  . Cardiomyopathy  . Coronary Artery Disease    VS Filed Vitals:   05/06/13 1334  BP: 116/71  Pulse: 95  Height: 5\' 10"  (1.778 m)  Weight: 182 lb (82.555 kg)    Weights Current Weight  05/06/13 182 lb (82.555 kg)  04/27/13 187 lb (84.823 kg)  04/17/13 180 lb (81.647 kg)    Blood Pressure  BP Readings from Last 3 Encounters:  05/06/13 116/71  04/27/13 149/80  04/17/13 143/75     Admit date:  (Not on file) Last encounter with RMR:  04/27/2013   Allergy Review of patient's allergies indicates no known allergies.  Current Outpatient Prescriptions  Medication Sig Dispense Refill  . ferrous sulfate 325 (65 FE) MG tablet Take 325 mg by mouth daily with breakfast.      . furosemide (LASIX) 20 MG tablet Take 1 tablet (20 mg total) by mouth daily.  30 tablet  6  . loperamide (IMODIUM) 2 MG capsule       . metFORMIN (GLUCOPHAGE) 500 MG tablet Take 500 mg by mouth 2 (two) times daily. MAY TAKE 1 1/2 TAB IF NEEDED, PT ADJUSTS THIS MED HIMSELF      . Multiple Vitamin (MULTIVITAMIN WITH MINERALS) TABS Take 1 tablet by mouth daily.      . nitroGLYCERIN (NITROSTAT) 0.4 MG SL tablet Place 1 tablet (0.4 mg total) under the tongue every 5 (five) minutes x 3 doses as needed for chest pain.  25 tablet  3  . pantoprazole (PROTONIX) 20 MG tablet Take 20 mg by mouth.       . potassium chloride SA (K-DUR,KLOR-CON) 20 MEQ tablet Take 1 tablet (20 mEq total) by mouth daily.  30 tablet  6  . PROAIR HFA 108 (90 BASE) MCG/ACT inhaler Inhale 2 puffs into the lungs every 4 (four) hours as needed for wheezing or shortness of breath.       . tamsulosin (FLOMAX) 0.4 MG CAPS Take 0.4 mg by mouth daily after supper.       . Ticagrelor  (BRILINTA) 90 MG TABS tablet Take 1 tablet (90 mg total) by mouth 2 (two) times daily.  60 tablet  6   No current facility-administered medications for this visit.    Discontinued Meds:   There are no discontinued medications.  Patient Active Problem List   Diagnosis Date Noted  . Noncompliance 11/22/2012  . Anemia, normocytic normochromic   . Cardiomyopathy, ischemic 06/21/2012  . Cerebrovascular disease 06/08/2012  . Diabetes mellitus, type II   . Hypertension   . Arteriosclerotic cardiovascular disease (ASCVD) 10/25/2011  . Hyperlipidemia 10/24/2011  . BPH (benign prostatic hyperplasia) 05/07/2011    LABS    Component Value Date/Time   NA 138 04/27/2013 1140   NA 135 04/17/2013 1320   NA 134* 11/29/2012 0933   K 4.5 04/27/2013 1140   K 3.8 04/17/2013 1320   K 4.4 11/29/2012 0933   CL 100 04/27/2013 1140   CL 97 04/17/2013 1320   CL 96 11/29/2012 0933   CO2 24 04/27/2013 1140   CO2 23 04/17/2013 1320   CO2 22 11/29/2012 0933  GLUCOSE 159* 04/27/2013 1140   GLUCOSE 208* 04/17/2013 1320   GLUCOSE 216* 11/29/2012 0933   BUN 14 04/27/2013 1140   BUN 12 04/17/2013 1320   BUN 14 11/29/2012 0933   CREATININE 1.19 04/27/2013 1140   CREATININE 0.92 04/17/2013 1320   CREATININE 1.05 11/29/2012 0933   CREATININE 0.79 11/27/2012 0640   CREATININE 0.98 12/04/2011 1208   CALCIUM 9.6 04/27/2013 1140   CALCIUM 9.6 04/17/2013 1320   CALCIUM 10.4 11/29/2012 0933   GFRNONAA 76* 04/17/2013 1320   GFRNONAA 64* 11/29/2012 0933   GFRNONAA 82* 11/27/2012 0640   GFRAA 89* 04/17/2013 1320   GFRAA 75* 11/29/2012 0933   GFRAA >90 11/27/2012 0640   CMP     Component Value Date/Time   NA 138 04/27/2013 1140   K 4.5 04/27/2013 1140   CL 100 04/27/2013 1140   CO2 24 04/27/2013 1140   GLUCOSE 159* 04/27/2013 1140   BUN 14 04/27/2013 1140   CREATININE 1.19 04/27/2013 1140   CREATININE 0.92 04/17/2013 1320   CALCIUM 9.6 04/27/2013 1140   PROT 6.7 04/17/2013 1320   ALBUMIN 3.7 04/17/2013 1320   AST 32  04/17/2013 1320   ALT 29 04/17/2013 1320   ALKPHOS 87 04/17/2013 1320   BILITOT 0.2* 04/17/2013 1320   GFRNONAA 76* 04/17/2013 1320   GFRAA 89* 04/17/2013 1320       Component Value Date/Time   WBC 6.7 04/17/2013 1320   WBC 8.3 11/29/2012 0933   WBC 7.7 11/28/2012 1405   HGB 12.7* 04/17/2013 1320   HGB 12.7* 11/29/2012 0933   HGB 11.4* 11/28/2012 1405   HCT 37.8* 04/17/2013 1320   HCT 39.1 11/29/2012 0933   HCT 34.9* 11/28/2012 1405   MCV 92.4 04/17/2013 1320   MCV 85.4 11/29/2012 0933   MCV 85.7 11/28/2012 1405    Lipid Panel     Component Value Date/Time   CHOL 124 10/07/2012 0700   TRIG 211* 10/07/2012 0700   HDL 31* 10/07/2012 0700   CHOLHDL 4.0 10/07/2012 0700   VLDL 42* 10/07/2012 0700   LDLCALC 51 10/07/2012 0700    ABG    Component Value Date/Time   TCO2 23 10/06/2012 1545     Lab Results  Component Value Date   TSH 3.483 10/16/2012   BNP (last 3 results)  Recent Labs  07/29/12 1340 07/29/12 1855 04/27/13 1140  PROBNP 863.6* 845.2* 1023.00*   Cardiac Panel (last 3 results) No results found for this basename: CKTOTAL, CKMB, TROPONINI, RELINDX,  in the last 72 hours  Iron/TIBC/Ferritin No results found for this basename: iron, tibc, ferritin     EKG Orders placed during the hospital encounter of 04/17/13  . EKG 12-LEAD  . EKG 12-LEAD  . EKG     Prior Assessment and Plan Problem List as of 05/06/2013     Cardiovascular and Mediastinum   Arteriosclerotic cardiovascular disease (ASCVD)   Last Assessment & Plan   04/27/2013 Office Visit Written 04/27/2013  1:55 PM by Jodelle Gross, NP     He will continue with ASA and Brilinta until March of 2015.     Hypertension   Last Assessment & Plan   04/27/2013 Office Visit Written 04/27/2013  1:49 PM by Jodelle Gross, NP     BP is elevated today with some fluid overload noted. He is no longer on antihypertensive medications as he states that he was having syncope and hypotension.     Cerebrovascular disease    Last Assessment &  Plan   09/09/2012 Office Visit Written 09/09/2012  3:42 PM by Kathlen Brunswick, MD     Recent carotid ultrasound negative for obstruction. Optimal control of cardiovascular risk factors appears effective in preventing progression of disease and will be continued.    Cardiomyopathy, ischemic   Last Assessment & Plan   04/27/2013 Office Visit Written 04/27/2013  1:52 PM by Jodelle Gross, NP     I will have echo complete to evaluate his EF. I have started him on lasix 20 mg daily with potassium for the next 3 days, then stop. He may need to be on every other day dosing. He is to be on low sodium, diet and not add salt to his foods, He appears to be dyspneic at rest with talking. I will check a BMET and Pro-BNP today. Follow up in one week.      Endocrine   Diabetes mellitus, type II   Last Assessment & Plan   04/27/2013 Office Visit Written 04/27/2013  1:54 PM by Jodelle Gross, NP     Followed by PCP      Genitourinary   BPH (benign prostatic hyperplasia)   Last Assessment & Plan   09/09/2012 Office Visit Edited 09/11/2012 11:21 PM by Kathlen Brunswick, MD     Patient advised not to increase his dose of Flomax or other medications without specific instructions and will resume his usual daily dose.  He is scheduled to return to see Dr. Jerre Simon in the near future for catheter removal.      Other   Hyperlipidemia   Last Assessment & Plan   04/27/2013 Office Visit Written 04/27/2013  1:53 PM by Jodelle Gross, NP     He is no longer on statin. Will need to check fasting levels in a few weeks.     Anemia, normocytic normochromic   Last Assessment & Plan   04/27/2013 Office Visit Written 04/27/2013  1:55 PM by Jodelle Gross, NP     Stable. Most recent lab work last week. Continues on iron replacement.    Noncompliance   Last Assessment & Plan   04/27/2013 Office Visit Written 04/27/2013  1:54 PM by Jodelle Gross, NP     He is not adhering to low  sodium diet. I have reinforced this to him. He is to weigh daily.        Imaging: Dg Chest 2 View  04/17/2013   CLINICAL DATA:  Leg swelling.  EXAM: CHEST  2 VIEW  COMPARISON:  11/26/2012 and 06/23/2012  FINDINGS: Lungs are clear. Cardiomediastinal silhouette and remainder of the exam is unchanged.  IMPRESSION: No active cardiopulmonary disease.   Electronically Signed   By: Elberta Fortis M.D.   On: 04/17/2013 13:55

## 2013-05-09 ENCOUNTER — Emergency Department (HOSPITAL_COMMUNITY)
Admission: EM | Admit: 2013-05-09 | Discharge: 2013-05-09 | Disposition: A | Discharge status: Home / Self Care | Payer: Medicare Other | Attending: Emergency Medicine | Admitting: Emergency Medicine

## 2013-05-09 ENCOUNTER — Encounter (HOSPITAL_COMMUNITY): Payer: Self-pay | Admitting: Emergency Medicine

## 2013-05-09 DIAGNOSIS — N400 Enlarged prostate without lower urinary tract symptoms: Secondary | ICD-10-CM | POA: Insufficient documentation

## 2013-05-09 DIAGNOSIS — Z8701 Personal history of pneumonia (recurrent): Secondary | ICD-10-CM | POA: Insufficient documentation

## 2013-05-09 DIAGNOSIS — Z8673 Personal history of transient ischemic attack (TIA), and cerebral infarction without residual deficits: Secondary | ICD-10-CM | POA: Insufficient documentation

## 2013-05-09 DIAGNOSIS — I10 Essential (primary) hypertension: Secondary | ICD-10-CM | POA: Insufficient documentation

## 2013-05-09 DIAGNOSIS — K219 Gastro-esophageal reflux disease without esophagitis: Secondary | ICD-10-CM | POA: Insufficient documentation

## 2013-05-09 DIAGNOSIS — Z8711 Personal history of peptic ulcer disease: Secondary | ICD-10-CM | POA: Insufficient documentation

## 2013-05-09 DIAGNOSIS — Z79899 Other long term (current) drug therapy: Secondary | ICD-10-CM | POA: Insufficient documentation

## 2013-05-09 DIAGNOSIS — Z8739 Personal history of other diseases of the musculoskeletal system and connective tissue: Secondary | ICD-10-CM | POA: Insufficient documentation

## 2013-05-09 DIAGNOSIS — Z9861 Coronary angioplasty status: Secondary | ICD-10-CM | POA: Insufficient documentation

## 2013-05-09 DIAGNOSIS — Z8781 Personal history of (healed) traumatic fracture: Secondary | ICD-10-CM | POA: Insufficient documentation

## 2013-05-09 DIAGNOSIS — Y9289 Other specified places as the place of occurrence of the external cause: Secondary | ICD-10-CM | POA: Insufficient documentation

## 2013-05-09 DIAGNOSIS — IMO0002 Reserved for concepts with insufficient information to code with codable children: Secondary | ICD-10-CM | POA: Insufficient documentation

## 2013-05-09 DIAGNOSIS — Z87891 Personal history of nicotine dependence: Secondary | ICD-10-CM | POA: Insufficient documentation

## 2013-05-09 DIAGNOSIS — E119 Type 2 diabetes mellitus without complications: Secondary | ICD-10-CM | POA: Insufficient documentation

## 2013-05-09 DIAGNOSIS — W230XXA Caught, crushed, jammed, or pinched between moving objects, initial encounter: Secondary | ICD-10-CM | POA: Insufficient documentation

## 2013-05-09 DIAGNOSIS — D649 Anemia, unspecified: Secondary | ICD-10-CM | POA: Insufficient documentation

## 2013-05-09 DIAGNOSIS — Z951 Presence of aortocoronary bypass graft: Secondary | ICD-10-CM | POA: Insufficient documentation

## 2013-05-09 DIAGNOSIS — S60512A Abrasion of left hand, initial encounter: Secondary | ICD-10-CM

## 2013-05-09 DIAGNOSIS — Y9389 Activity, other specified: Secondary | ICD-10-CM | POA: Insufficient documentation

## 2013-05-09 MED ORDER — TETANUS-DIPHTH-ACELL PERTUSSIS 5-2.5-18.5 LF-MCG/0.5 IM SUSP
0.5000 mL | Freq: Once | INTRAMUSCULAR | Status: AC
  Administered 2013-05-09: 0.5 mL via INTRAMUSCULAR
  Filled 2013-05-09: qty 0.5

## 2013-05-09 NOTE — ED Notes (Signed)
Pt reports tripping and falling, catching hand between the table and a chair.  Pt reports skin tear on back of left hand.

## 2013-05-09 NOTE — ED Provider Notes (Signed)
CSN: [id number]     Arrival date & time 05/09/13  4098 History   First MD Initiated Contact with Patient 05/09/13 0324     Chief Complaint  Patient presents with  . Hand Injury   Patient is a 79 y.o. male presenting with hand injury. The history is provided by the patient.  Hand Injury Location:  Hand Hand location:  L hand Pain details:    Quality:  Aching   Radiates to:  Does not radiate   Severity:  Mild   Duration:  1 hour   Timing:  Constant   Progression:  Unchanged Tetanus status:  Unknown Relieved by:  Rest Associated symptoms: no fever   pt reports he was getting up, tripped over his socks and his left hand got caught between table and chair.  No fall, no head injury and no LOC He reports abrasions to left hand that did not stop bleeding  Past Medical History  Diagnosis Date  . Diabetes mellitus, type II   . Hyperlipidemia     Lipid profile in 02/2012:135, 227, 41, 49  . Arteriosclerotic cardiovascular disease (ASCVD)     a. CABG x 4 in 1989 (VG->OM1->OM2, VG->RCA, LIMA->LAD), b. 05/2010: DES to VG-OM1/OM2, DES to distal LCx. c. NSTEMI in 04/2011 - TO distal LCX stent and VG->OM2. d. 02/2012 NSTEMI DES to VG-OM1/continuation to OM2 occluded. e. inferior STEMI s/p DES to SVG-RAMUS 06/2012. f. inferolat STEMI 09/2012 s/p DES to SVG-interm.  . Hypertension   . Osteoarthritis   . CVA (cerebral infarction)     details unclear. pt reports light stroke last year involving L leg. No imaging to suggest hemorrhagic etiology  . Pneumonia   . Cervical vertebral fracture   . Chronic back pain   . Benign prostatic hypertrophy     Required urinary catheter x4 weeks for retention  . Peptic ulcer disease   . Gastroesophageal reflux disease   . Ischemic cardiomyopathy     EF 45-50% 3/32014  . DM (diabetes mellitus)   . Anemia    Past Surgical History  Procedure Laterality Date  . Coronary angioplasty with stent placement      2011, 02/2012  . Tonsillectomy    . Coronary artery  bypass graft     Family History  Problem Relation Age of Onset  . Early death      Parents died young  . Appendicitis Mother     Pt was 65 year old  . Heart attack Father 60   History  Substance Use Topics  . Smoking status: Former Smoker -- 2.00 packs/day for 10 years    Types: Cigarettes    Quit date: 07/14/1961  . Smokeless tobacco: Current User    Types: Chew  . Alcohol Use: No    Review of Systems  Constitutional: Negative for fever.  Skin: Positive for wound.  Neurological: Negative for headaches.    Allergies  Review of patient's allergies indicates no known allergies.  Home Medications   Current Outpatient Rx  Name  Route  Sig  Dispense  Refill  . ferrous sulfate 325 (65 FE) MG tablet   Oral   Take 325 mg by mouth daily with breakfast.         . furosemide (LASIX) 20 MG tablet      Take 1 tablet as needed if 3 lb weight gain.   30 tablet   2   . loperamide (IMODIUM) 2 MG capsule               .  metFORMIN (GLUCOPHAGE) 500 MG tablet   Oral   Take 500 mg by mouth 2 (two) times daily. MAY TAKE 1 1/2 TAB IF NEEDED, PT ADJUSTS THIS MED HIMSELF         . Multiple Vitamin (MULTIVITAMIN WITH MINERALS) TABS   Oral   Take 1 tablet by mouth daily.         . nitroGLYCERIN (NITROSTAT) 0.4 MG SL tablet   Sublingual   Place 1 tablet (0.4 mg total) under the tongue every 5 (five) minutes x 3 doses as needed for chest pain.   25 tablet   3   . pantoprazole (PROTONIX) 20 MG tablet   Oral   Take 20 mg by mouth.          . potassium chloride SA (K-DUR,KLOR-CON) 20 MEQ tablet   Oral   Take 1 tablet (20 mEq total) by mouth daily.   30 tablet   6   . PROAIR HFA 108 (90 BASE) MCG/ACT inhaler   Inhalation   Inhale 2 puffs into the lungs every 4 (four) hours as needed for wheezing or shortness of breath.          . tamsulosin (FLOMAX) 0.4 MG CAPS   Oral   Take 0.4 mg by mouth daily after supper.          . Ticagrelor (BRILINTA) 90 MG TABS  tablet   Oral   Take 1 tablet (90 mg total) by mouth 2 (two) times daily.   60 tablet   6    BP 153/79  Pulse 85  Temp(Src) 98 F (36.7 C) (Oral)  Resp 18  Ht 5\' 10"  (1.778 m)  Wt 180 lb (81.647 kg)  BMI 25.83 kg/m2  SpO2 96% Physical Exam CONSTITUTIONAL: Well developed/well nourished HEAD: Normocephalic/atraumatic EYES: EOMI/PERRL ENMT: Mucous membranes moist NECK: supple no meningeal signs SPINE cervical spine nontender CV: S1/S2 noted, no murmurs/rubs/gallops noted LUNGS: Lungs are clear to auscultation bilaterally, no apparent distress ABDOMEN: soft NEURO: Pt is awake/alert, moves all extremitiesx4 EXTREMITIES: pulses normal, full ROM, no bony tenderness to left hand/fingers/wrist.  Full ROM of left wrist without any pain SKIN: warm, color normal, large abrasion to dorsal surface of left hand.  No active bleeding.  There is no bone/tendons exposed PSYCH: no abnormalities of mood noted  ED Course  Procedures (including critical care time) Labs Review Labs Reviewed - No data to display Imaging Review No results found.  EKG Interpretation   None       MDM   1. Abrasion of left hand, initial encounter    Nursing notes including past medical history and social history reviewed and considered in documentation  Wound not amenable to suture repair.  Will place bandage and update tetanus    Joya Gaskins, MD 05/09/13 [phone]

## 2013-05-18 ENCOUNTER — Telehealth: Payer: Self-pay | Admitting: Adult Health

## 2013-05-18 NOTE — Telephone Encounter (Signed)
Received fax refill request  Rx # [id number]  Medication:  Proair HFA Inhaler 90 mcg Qty 8.50 gm Sig:  Inhale 1-2 puffs every 6 hrs as needed Physician:  Lyman Bishop

## 2013-05-18 NOTE — Telephone Encounter (Signed)
Needs to be refilled by PCP. This was curtesy until they could get it filled.

## 2013-05-21 ENCOUNTER — Emergency Department (HOSPITAL_COMMUNITY): Payer: Medicare Other

## 2013-05-21 ENCOUNTER — Inpatient Hospital Stay (HOSPITAL_COMMUNITY)
Admission: EM | Admit: 2013-05-21 | Discharge: 2013-05-22 | DRG: 313 | Disposition: A | Discharge status: Home / Self Care | Payer: Medicare Other | Attending: Cardiovascular Disease | Admitting: Cardiovascular Disease

## 2013-05-21 ENCOUNTER — Encounter (HOSPITAL_COMMUNITY): Payer: Self-pay | Admitting: Emergency Medicine

## 2013-05-21 DIAGNOSIS — Z8249 Family history of ischemic heart disease and other diseases of the circulatory system: Secondary | ICD-10-CM

## 2013-05-21 DIAGNOSIS — K219 Gastro-esophageal reflux disease without esophagitis: Secondary | ICD-10-CM | POA: Diagnosis present

## 2013-05-21 DIAGNOSIS — E785 Hyperlipidemia, unspecified: Secondary | ICD-10-CM

## 2013-05-21 DIAGNOSIS — I679 Cerebrovascular disease, unspecified: Secondary | ICD-10-CM

## 2013-05-21 DIAGNOSIS — Z79899 Other long term (current) drug therapy: Secondary | ICD-10-CM

## 2013-05-21 DIAGNOSIS — Z9861 Coronary angioplasty status: Secondary | ICD-10-CM

## 2013-05-21 DIAGNOSIS — D649 Anemia, unspecified: Secondary | ICD-10-CM

## 2013-05-21 DIAGNOSIS — I2589 Other forms of chronic ischemic heart disease: Secondary | ICD-10-CM | POA: Diagnosis present

## 2013-05-21 DIAGNOSIS — I252 Old myocardial infarction: Secondary | ICD-10-CM

## 2013-05-21 DIAGNOSIS — Z9119 Patient's noncompliance with other medical treatment and regimen: Secondary | ICD-10-CM

## 2013-05-21 DIAGNOSIS — I10 Essential (primary) hypertension: Secondary | ICD-10-CM | POA: Diagnosis present

## 2013-05-21 DIAGNOSIS — Z87891 Personal history of nicotine dependence: Secondary | ICD-10-CM

## 2013-05-21 DIAGNOSIS — Z8701 Personal history of pneumonia (recurrent): Secondary | ICD-10-CM

## 2013-05-21 DIAGNOSIS — I25810 Atherosclerosis of coronary artery bypass graft(s) without angina pectoris: Secondary | ICD-10-CM | POA: Diagnosis present

## 2013-05-21 DIAGNOSIS — G8929 Other chronic pain: Secondary | ICD-10-CM | POA: Diagnosis present

## 2013-05-21 DIAGNOSIS — I7090 Unspecified atherosclerosis: Secondary | ICD-10-CM

## 2013-05-21 DIAGNOSIS — Z8711 Personal history of peptic ulcer disease: Secondary | ICD-10-CM

## 2013-05-21 DIAGNOSIS — Z8673 Personal history of transient ischemic attack (TIA), and cerebral infarction without residual deficits: Secondary | ICD-10-CM

## 2013-05-21 DIAGNOSIS — Z7902 Long term (current) use of antithrombotics/antiplatelets: Secondary | ICD-10-CM

## 2013-05-21 DIAGNOSIS — R079 Chest pain, unspecified: Principal | ICD-10-CM | POA: Diagnosis present

## 2013-05-21 DIAGNOSIS — N400 Enlarged prostate without lower urinary tract symptoms: Secondary | ICD-10-CM | POA: Diagnosis present

## 2013-05-21 DIAGNOSIS — Z602 Problems related to living alone: Secondary | ICD-10-CM

## 2013-05-21 DIAGNOSIS — Z7982 Long term (current) use of aspirin: Secondary | ICD-10-CM

## 2013-05-21 DIAGNOSIS — M549 Dorsalgia, unspecified: Secondary | ICD-10-CM | POA: Diagnosis present

## 2013-05-21 DIAGNOSIS — I2510 Atherosclerotic heart disease of native coronary artery without angina pectoris: Secondary | ICD-10-CM | POA: Diagnosis present

## 2013-05-21 DIAGNOSIS — E119 Type 2 diabetes mellitus without complications: Secondary | ICD-10-CM | POA: Diagnosis present

## 2013-05-21 DIAGNOSIS — I255 Ischemic cardiomyopathy: Secondary | ICD-10-CM

## 2013-05-21 HISTORY — DX: Essential (primary) hypertension: I10

## 2013-05-21 HISTORY — DX: Personal history of transient ischemic attack (TIA), and cerebral infarction without residual deficits: Z86.73

## 2013-05-21 HISTORY — DX: Atherosclerotic heart disease of native coronary artery without angina pectoris: I25.10

## 2013-05-21 LAB — CBC
HCT: 39.1 % (ref 39.0–52.0)
MCH: 31 pg (ref 26.0–34.0)
MCHC: 33.5 g/dL (ref 30.0–36.0)
MCV: 92.4 fL (ref 78.0–100.0)
Platelets: 145 10*3/uL — ABNORMAL LOW (ref 150–400)
RDW: 12.7 % (ref 11.5–15.5)
WBC: 6.5 10*3/uL (ref 4.0–10.5)

## 2013-05-21 LAB — TROPONIN I
Troponin I: <0.3 ng/mL (ref <0.30)
Troponin I: <0.3 ng/mL (ref <0.30)
Troponin I: <0.3 ng/mL (ref <0.30)

## 2013-05-21 LAB — BASIC METABOLIC PANEL
BUN: 22 mg/dL (ref 6–23)
Chloride: 97 mEq/L (ref 96–112)
Creatinine, Ser: 1.13 mg/dL (ref 0.50–1.35)
GFR calc Af Amer: 68 mL/min — ABNORMAL LOW (ref >90)
GFR calc non Af Amer: 59 mL/min — ABNORMAL LOW (ref >90)
Glucose, Bld: 212 mg/dL — ABNORMAL HIGH (ref 70–99)

## 2013-05-21 LAB — GLUCOSE, CAPILLARY

## 2013-05-21 MED ORDER — INSULIN ASPART 100 UNIT/ML ~~LOC~~ SOLN
0.0000 [IU] | Freq: Three times a day (TID) | SUBCUTANEOUS | Status: DC
  Administered 2013-05-22: 5 [IU] via SUBCUTANEOUS

## 2013-05-21 MED ORDER — SULFAMETHOXAZOLE-TRIMETHOPRIM 400-80 MG PO TABS
1.0000 | ORAL_TABLET | Freq: Every day | ORAL | Status: DC
  Administered 2013-05-21 – 2013-05-22 (×2): 1 via ORAL
  Filled 2013-05-21 (×2): qty 1

## 2013-05-21 MED ORDER — TICAGRELOR 90 MG PO TABS
90.0000 mg | ORAL_TABLET | Freq: Two times a day (BID) | ORAL | Status: DC
  Administered 2013-05-21 – 2013-05-22 (×2): 90 mg via ORAL
  Filled 2013-05-21 (×3): qty 1

## 2013-05-21 MED ORDER — ACETAMINOPHEN 325 MG PO TABS: 650.0000 mg | ORAL_TABLET | ORAL | Status: DC | PRN

## 2013-05-21 MED ORDER — CARVEDILOL 3.125 MG PO TABS
3.1250 mg | ORAL_TABLET | Freq: Two times a day (BID) | ORAL | Status: DC
  Administered 2013-05-21 – 2013-05-22 (×2): 3.125 mg via ORAL
  Filled 2013-05-21 (×4): qty 1

## 2013-05-21 MED ORDER — ADULT MULTIVITAMIN W/MINERALS CH
1.0000 | ORAL_TABLET | Freq: Every day | ORAL | Status: DC
  Administered 2013-05-22: 1 via ORAL
  Filled 2013-05-21: qty 1

## 2013-05-21 MED ORDER — ATORVASTATIN CALCIUM 10 MG PO TABS
10.0000 mg | ORAL_TABLET | Freq: Every day | ORAL | Status: DC
  Administered 2013-05-21: 10 mg via ORAL
  Filled 2013-05-21 (×2): qty 1

## 2013-05-21 MED ORDER — METFORMIN HCL 500 MG PO TABS
500.0000 mg | ORAL_TABLET | Freq: Two times a day (BID) | ORAL | Status: DC
  Administered 2013-05-21 – 2013-05-22 (×2): 500 mg via ORAL
  Filled 2013-05-21 (×4): qty 1

## 2013-05-21 MED ORDER — TAMSULOSIN HCL 0.4 MG PO CAPS
0.4000 mg | ORAL_CAPSULE | Freq: Every day | ORAL | Status: DC
  Administered 2013-05-21: 0.4 mg via ORAL
  Filled 2013-05-21 (×2): qty 1

## 2013-05-21 MED ORDER — PANTOPRAZOLE SODIUM 20 MG PO TBEC
20.0000 mg | DELAYED_RELEASE_TABLET | Freq: Every day | ORAL | Status: DC
  Administered 2013-05-22: 20 mg via ORAL
  Filled 2013-05-21: qty 1

## 2013-05-21 MED ORDER — TICAGRELOR 90 MG PO TABS: 90.0000 mg | ORAL_TABLET | Freq: Two times a day (BID) | ORAL | Status: DC

## 2013-05-21 MED ORDER — NITROGLYCERIN 0.4 MG SL SUBL: 0.4000 mg | SUBLINGUAL_TABLET | SUBLINGUAL | Status: DC | PRN

## 2013-05-21 MED ORDER — ONDANSETRON HCL 4 MG/2ML IJ SOLN: 4.0000 mg | Freq: Four times a day (QID) | INTRAMUSCULAR | Status: DC | PRN

## 2013-05-21 MED ORDER — ASPIRIN 81 MG PO CHEW
324.0000 mg | CHEWABLE_TABLET | Freq: Once | ORAL | Status: AC
  Administered 2013-05-21: 324 mg via ORAL
  Filled 2013-05-21: qty 4

## 2013-05-21 MED ORDER — FERROUS SULFATE 325 (65 FE) MG PO TABS
325.0000 mg | ORAL_TABLET | Freq: Every day | ORAL | Status: DC
  Administered 2013-05-21 – 2013-05-22 (×2): 325 mg via ORAL
  Filled 2013-05-21 (×3): qty 1

## 2013-05-21 MED ORDER — ALBUTEROL SULFATE HFA 108 (90 BASE) MCG/ACT IN AERS
2.0000 | INHALATION_SPRAY | Freq: Four times a day (QID) | RESPIRATORY_TRACT | Status: DC | PRN
  Filled 2013-05-21: qty 6.7

## 2013-05-21 MED ORDER — ASPIRIN EC 81 MG PO TBEC
81.0000 mg | DELAYED_RELEASE_TABLET | Freq: Every day | ORAL | Status: DC
  Administered 2013-05-22: 81 mg via ORAL
  Filled 2013-05-21: qty 1

## 2013-05-21 NOTE — ED Provider Notes (Signed)
TIME SEEN: 11:39 AM   CHIEF COMPLAINT: Chest pain   HPI: Jeffrey [name] is a 79 y.o. male with a history of DM, hyperlipidemia, coronary artery disease status post multiple stents and CABG, hypertension, CVA who presents to the Emergency Department complaining of non-radiating substernal chest pain that started today while the patient was sitting in his daughter's driveway.  He states that the pain is located in his upper chest and feels "hard".  He also states that the pain is similar to the pain he experienced when he got his stent placement.  The patient states that the pain lasted for about 20 minutes and denies doing any strenuous activity at the time of the pain.    He states that he ate 4 tums and drank water with no alleviation in his pain.  He denies diaphoresis, dizziness, fever, and cough as associated symptoms.  He lists mild SOB as an associated symptom.    No history of PE or DVT. No lower extremity swelling or pain. No recent prolonged immobilization, fracture, surgery, trauma. The patient's PCP is Dr. Sherwood Gambler and his cardiologist is with [facility].  The patient's last cardiac catheritization was April 2014.   ROS: See HPI Constitutional: no fever  Eyes: no drainage  ENT: no runny nose   Cardiovascular:  chest pain  Resp: SOB  GI: no vomiting GU: no dysuria Integumentary: no rash  Allergy: no hives  Musculoskeletal: no leg swelling  Neurological: no slurred speech ROS otherwise negative  PAST MEDICAL HISTORY/PAST SURGICAL HISTORY:  Past Medical History  Diagnosis Date  . Diabetes mellitus, type II   . Hyperlipidemia     Lipid profile in 02/2012:135, 227, 41, 49  . Arteriosclerotic cardiovascular disease (ASCVD)     a. CABG x 4 in 1989 (VG->OM1->OM2, VG->RCA, LIMA->LAD), b. 05/2010: DES to VG-OM1/OM2, DES to distal LCx. c. NSTEMI in 04/2011 - TO distal LCX stent and VG->OM2. d. 02/2012 NSTEMI DES to VG-OM1/continuation to OM2 occluded. e. inferior STEMI s/p DES to SVG-RAMUS  06/2012. f. inferolat STEMI 09/2012 s/p DES to SVG-interm.  . Hypertension   . Osteoarthritis   . CVA (cerebral infarction)     details unclear. pt reports light stroke last year involving L leg. No imaging to suggest hemorrhagic etiology  . Pneumonia   . Cervical vertebral fracture   . Chronic back pain   . Benign prostatic hypertrophy     Required urinary catheter x4 weeks for retention  . Peptic ulcer disease   . Gastroesophageal reflux disease   . Ischemic cardiomyopathy     EF 45-50% 3/32014  . DM (diabetes mellitus)   . Anemia     MEDICATIONS:  Prior to Admission medications   Medication Sig Start Date End Date Taking? Authorizing Provider  ferrous sulfate 325 (65 FE) MG tablet Take 325 mg by mouth daily with breakfast.    Historical Provider, MD  furosemide (LASIX) 20 MG tablet Take 1 tablet as needed if 3 lb weight gain. 05/06/13   Jodelle Gross, NP  loperamide (IMODIUM) 2 MG capsule  02/28/13   Historical Provider, MD  metFORMIN (GLUCOPHAGE) 500 MG tablet Take 500 mg by mouth 2 (two) times daily. MAY TAKE 1 1/2 TAB IF NEEDED, PT ADJUSTS THIS MED HIMSELF 11/11/12   Joline Salt Barrett, PA-C  Multiple Vitamin (MULTIVITAMIN WITH MINERALS) TABS Take 1 tablet by mouth daily.    Historical Provider, MD  nitroGLYCERIN (NITROSTAT) 0.4 MG SL tablet Place 1 tablet (0.4 mg total) under the  tongue every 5 (five) minutes x 3 doses as needed for chest pain. 11/11/12 11/11/13  Rhonda G Barrett, PA-C  pantoprazole (PROTONIX) 20 MG tablet Take 20 mg by mouth.  01/27/13   Historical Provider, MD  potassium chloride SA (K-DUR,KLOR-CON) 20 MEQ tablet Take 1 tablet (20 mEq total) by mouth daily. 04/28/13   Jodelle Gross, NP  PROAIR HFA 108 (90 BASE) MCG/ACT inhaler Inhale 2 puffs into the lungs every 4 (four) hours as needed for wheezing or shortness of breath.  03/25/13   Historical Provider, MD  tamsulosin (FLOMAX) 0.4 MG CAPS Take 0.4 mg by mouth daily after supper.  01/27/13   Historical Provider,  MD  Ticagrelor (BRILINTA) 90 MG TABS tablet Take 1 tablet (90 mg total) by mouth 2 (two) times daily. 06/21/12   Roger A Arguello, PA-C    ALLERGIES:  No Known Allergies  SOCIAL HISTORY:  History  Substance Use Topics  . Smoking status: Former Smoker -- 2.00 packs/day for 10 years    Types: Cigarettes    Quit date: 07/14/1961  . Smokeless tobacco: Current User    Types: Chew  . Alcohol Use: No    FAMILY HISTORY: Family History  Problem Relation Age of Onset  . Early death      Parents died young  . Appendicitis Mother     Pt was 24 year old  . Heart attack Father 40    EXAM: BP 151/72  Pulse 85  Temp(Src) 97.6 F (36.4 C) (Oral)  Resp 16  Ht 5\' 10"  (1.778 m)  Wt 180 lb (81.647 kg)  BMI 25.83 kg/m2  SpO2 96% CONSTITUTIONAL: Alert and oriented and responds appropriately to questions. Well-appearing; well-nourished HEAD: Normocephalic EYES: Conjunctivae clear, PERRL ENT: normal nose; no rhinorrhea; moist mucous membranes; pharynx without lesions noted NECK: Supple, no meningismus, no LAD  CARD: RRR; S1 and S2 appreciated; no murmurs, no clicks, no rubs, no gallops RESP: Normal chest excursion without splinting or tachypnea; breath sounds clear and equal bilaterally; no wheezes, no rhonchi, no rales,  ABD/GI: Normal bowel sounds; non-distended; soft, non-tender, no rebound, no guarding BACK:  The back appears normal and is non-tender to palpation, there is no CVA tenderness EXT: Normal ROM in all joints; non-tender to palpation; no edema; normal capillary refill; no cyanosis    SKIN: Normal color for age and race; warm NEURO: Moves all extremities equally PSYCH: The patient's mood and manner are appropriate. Grooming and personal hygiene are appropriate.  MEDICAL DECISION MAKING: Patient with history of multiple cardiac stents who presents with chest pain similar to his prior episodes of angina. He is hemodynamically stable and currently chest pain-free. We'll obtain  cardiac labs, chest x-ray. His EKG shows no acute changes.  ED PROGRESS: Patient is still hemodynamically stable and asymptomatic. Have discussed with hospitalist for admission. However given patient's significant cardiac history, they recommend discussing with cardiology.   D/w Dr. Melburn Popper with [facility] cardiology who agrees to admit the patient over at [facility].  Patient agrees with this plan.     Layla Maw Ward, DO 05/21/13 [phone]

## 2013-05-21 NOTE — ED Notes (Signed)
Center, non radiating chest pain began 20 min PTA. Unable to describe other than "hurts". Pt states he took TUMS before arrival with a little relief.

## 2013-05-21 NOTE — H&P (Signed)
Primary cardiologist: Dr. Dietrich Pates  Clinical Summary [name] is an 79 y.o.male with complex cardiac history including remote CABG and subsequent percutaneous interventions. Most recent cardiac catheterization was in April of this year demonstrating severe multivessel native disease, patent SVG to ramus including the stented segment, patent LIMA to LAD, occluded SVG to OM - he was managed medically at that time. In May of this year he was seen with atypical chest pain and orthostatic hypotension, medical therapy was adjusted including discontinuation of long-acting nitrate and beta blocker at that time. Most recently he has been seen by Ms. Lawrence NP for management of acute on chronic combined heart failure symptoms manifested as leg edema. Patient uses Lasix as needed.  He is now transferred from [facility] having presented to the ER there following a 15 minute episode of rest chest pain that began this morning. Patient did not take nitroglycerin for this at home. Initial troponin I is negative. ECG shows sinus rhythm with nonspecific ST segment changes. Chest x-ray is without evidence of pulmonary edema. Patient does indicate that he recently has had some ankle edema and use Lasix up until yesterday. At the present time he is chest pain-free.  Recent echocardiogram on 10/20 demonstrated mild LVH with LVEF approximately 50%, grade 1 diastolic dysfunction with increased filling pressures, hypokinesis of the inferolateral myocardium, also inferoseptal and basal anterolateral myocardium.  He reports compliance with his medications. He lives alone in an apartment. States that he is functional in his ADLs.  No Known Allergies   Home Medications Prescriptions prior to admission  Medication Sig Dispense Refill  . albuterol (PROVENTIL HFA;VENTOLIN HFA) 108 (90 BASE) MCG/ACT inhaler Inhale 2 puffs into the lungs every 6 (six) hours as needed for wheezing or shortness of breath.        Marland Kitchen aspirin 325 MG tablet Take 325 mg by mouth daily.      . ferrous sulfate 325 (65 FE) MG tablet Take 325 mg by mouth daily with breakfast.      . furosemide (LASIX) 20 MG tablet Take 20 mg by mouth daily as needed for fluid. Take 1 tablet as needed if 3 lb weight gain.      Marland Kitchen loperamide (IMODIUM) 2 MG capsule Take 2 mg by mouth as needed for diarrhea or loose stools.       . metFORMIN (GLUCOPHAGE) 500 MG tablet Take 500 mg by mouth 2 (two) times daily with a meal.      . Multiple Vitamin (MULTIVITAMIN WITH MINERALS) TABS Take 1 tablet by mouth daily.      . pantoprazole (PROTONIX) 20 MG tablet Take 20 mg by mouth daily.       . potassium chloride SA (K-DUR,KLOR-CON) 20 MEQ tablet Take 20 mEq by mouth daily as needed (Takes only with Lasix).      Marland Kitchen sulfamethoxazole-trimethoprim (BACTRIM,SEPTRA) 400-80 MG per tablet Take 1 tablet by mouth daily.      . tamsulosin (FLOMAX) 0.4 MG CAPS Take 0.4 mg by mouth daily after supper.       . Ticagrelor (BRILINTA) 90 MG TABS tablet Take 1 tablet (90 mg total) by mouth 2 (two) times daily.  60 tablet  6  . nitroGLYCERIN (NITROSTAT) 0.4 MG SL tablet Place 1 tablet (0.4 mg total) under the tongue every 5 (five) minutes x 3 doses as needed for chest pain.  25 tablet  3     Past Medical History  Diagnosis Date  . Diabetes mellitus,  type II   . Hyperlipidemia     Lipid profile in 02/2012:135, 227, 41, 49  . Coronary atherosclerosis of native coronary artery     a. CABG x 4 in 1989 (VG->OM1->OM2, VG->RCA, LIMA->LAD), b. 05/2010: DES to VG-OM1/OM2, DES to distal LCx. c. NSTEMI in 04/2011 - TO distal LCX stent and VG->OM2. d. 02/2012 NSTEMI DES to VG-OM1/continuation to OM2 occluded. e. inferior STEMI s/p DES to SVG-RAMUS 06/2012. f. inferolat STEMI 09/2012 s/p DES to SVG-interm.  . Essential hypertension, benign   . Osteoarthritis   . History of stroke   . History of pneumonia   . Cervical vertebral fracture   . Chronic back pain   . Benign prostatic  hypertrophy     History of urinary retention  . Peptic ulcer disease   . Gastroesophageal reflux disease   . Ischemic cardiomyopathy     LVEF 45-50%    Past Surgical History  Procedure Laterality Date  . Tonsillectomy    . Coronary artery bypass graft      Family History  Problem Relation Age of Onset  . Early death      Parents died young  . Appendicitis Mother     Pt was 51 year old  . Heart attack Father 56    Social History Jeffrey [name] reports that he quit smoking about 51 years ago. His smoking use included Cigarettes. He has a 20 pack-year smoking history. His smokeless tobacco use includes Chew. Jeffrey [name] reports that he does not drink alcohol.  Review of Systems Denies any feelings of orthostatic dizziness, no syncope. No palpitations. Has had stable appetite. No bleeding problems. Otherwise negative.  Physical Examination Temp:  [97.6 F (36.4 C)-98.2 F (36.8 C)] 97.9 F (36.6 C) (11/08 1600) Pulse Rate:  [82-85] 82 (11/08 1600) Resp:  [15-20] 20 (11/08 1600) BP: (128-151)/(60-72) 133/66 mmHg (11/08 1600) SpO2:  [96 %-99 %] 98 % (11/08 1600) Weight:  [180 lb (81.647 kg)] 180 lb (81.647 kg) (11/08 1031)  Patient currently comfortable, no chest pain. HEENT: Conjunctiva and lids normal, oropharynx clear. Neck: Supple, no elevated JVP or carotid bruits, no thyromegaly. Lungs: Clear to auscultation, nonlabored breathing at rest. Cardiac: Regular rate and rhythm, no S3. Soft systolic murmur, no pericardial rub. Abdomen: Soft, nontender, bowel sounds present, no guarding or rebound. Extremities: Trace ankle edema, distal pulses 2+. Skin: Warm and dry. Musculoskeletal: No kyphosis. Neuropsychiatric: Alert and oriented x3, affect grossly appropriate.    Lab Results  Basic Metabolic Panel:  Recent Labs Lab 05/21/13 1047  NA 133*  K 4.4  CL 97  CO2 23  GLUCOSE 212*  BUN 22  CREATININE 1.13  CALCIUM 9.9    CBC:  Recent Labs Lab 05/21/13 1047    WBC 6.5  HGB 13.1  HCT 39.1  MCV 92.4  PLT 145*    Cardiac Enzymes:  Recent Labs Lab 05/21/13 1047  TROPONINI <0.30    Impression  1. Episode of chest pain at rest, now resolved. ECG shows nonspecific ST segment changes and initial troponin I is negative. Patient transferred for further management of possible unstable angina.  2. Known multivessel CAD status post CABG and subsequent percutaneous interventions, most recently DES to the SVG to ramus intermedius in March of this year. Subsequent cardiac catheterization in April demonstrated patent stent site and no other significant changes in graft anatomy.  3. History of transient orthostatic hypotension. Patient's cardiac regimen is somewhat limited. He is on DAPT, has not been on beta blocker  or long-acting nitrate. Blood pressure is currently stable. Also not on statin.  4. Type 2 diabetes mellitus, on metformin.  5. Ischemic cardiomyopathy, LVEF 45-50%. He has had leg edema intermittently, uses Lasix with potassium as needed.   Recommendations  Patient currently admitted to the telemetry unit and is chest pain-free. We will continue aspirin, Brilinta, try and add low-dose Coreg 3.125 mg twice daily, also place him on Lipitor 10 mg by mouth each bedtime. Cycle full set of cardiac markers and followup ECG in the morning as well as FLP. Depending on enzyme trend and his clinical stability, we can determine whether further ischemic workup is needed, versus managing him medically with close outpatient followup.   Jonelle Sidle, M.D., F.A.C.C.

## 2013-05-21 NOTE — ED Notes (Addendum)
Called to give report to room 3w 33c. No answer

## 2013-05-22 LAB — GLUCOSE, CAPILLARY
Glucose-Capillary: 216 mg/dL — ABNORMAL HIGH (ref 70–99)
Glucose-Capillary: 218 mg/dL — ABNORMAL HIGH (ref 70–99)

## 2013-05-22 LAB — COMPREHENSIVE METABOLIC PANEL
ALT: 26 U/L (ref 0–53)
AST: 25 U/L (ref 0–37)
Albumin: 3.4 g/dL — ABNORMAL LOW (ref 3.5–5.2)
Alkaline Phosphatase: 68 U/L (ref 39–117)
BUN: 16 mg/dL (ref 6–23)
CO2: 25 mEq/L (ref 19–32)
Chloride: 100 mEq/L (ref 96–112)
GFR calc Af Amer: 87 mL/min — ABNORMAL LOW (ref >90)
Potassium: 4 mEq/L (ref 3.5–5.1)
Sodium: 136 mEq/L (ref 135–145)
Total Bilirubin: 0.2 mg/dL — ABNORMAL LOW (ref 0.3–1.2)
Total Protein: 6.5 g/dL (ref 6.0–8.3)

## 2013-05-22 LAB — HEMOGLOBIN A1C
Hgb A1c MFr Bld: 8 % — ABNORMAL HIGH (ref <5.7)
Mean Plasma Glucose: 183 mg/dL — ABNORMAL HIGH (ref <117)

## 2013-05-22 LAB — LIPID PANEL
HDL: 33 mg/dL — ABNORMAL LOW (ref >39)
LDL Cholesterol: 90 mg/dL (ref 0–99)
Total CHOL/HDL Ratio: 5.1 RATIO
Triglycerides: 230 mg/dL — ABNORMAL HIGH (ref <150)
VLDL: 46 mg/dL — ABNORMAL HIGH (ref 0–40)

## 2013-05-22 LAB — TROPONIN I: Troponin I: <0.3 ng/mL (ref <0.30)

## 2013-05-22 MED ORDER — CARVEDILOL 3.125 MG PO TABS: 3.1250 mg | ORAL_TABLET | Freq: Two times a day (BID) | ORAL | Status: DC

## 2013-05-22 NOTE — Progress Notes (Signed)
Patient: Jeffrey [name] Date of Encounter: 05/22/2013, 7:47 AM Admit date: 05/21/2013     Subjective  Jeffrey [name] denies any complaints this AM. He has not had any recurrent CP since his 15 minute episode yesterday which prompted him to come to ED. He denies SOB. He is eager to go home.   Objective  Physical Exam: Vitals: BP 132/65  Pulse 72  Temp(Src) 98.1 F (36.7 C) (Oral)  Resp 18  Ht 5\' 10"  (1.778 m)  Wt 180 lb 0.2 oz (81.654 kg)  BMI 25.83 kg/m2  SpO2 99% General: Well developed, well appearing 79 year old male in no acute distress. Neck: Supple. JVD not elevated. Lungs: Clear bilaterally to auscultation without wheezes, rales, or rhonchi. Breathing is unlabored. Heart: RRR S1 S2 without murmur, rub or gallop.  Abdomen: Soft, non-distended. Extremities: No clubbing or cyanosis. No edema.  Distal pedal pulses are 2+ and equal bilaterally. Neuro: Alert and oriented X 3. Moves all extremities spontaneously. No focal deficits.  Intake/Output:  Intake/Output Summary (Last 24 hours) at 05/22/13 0747 Last data filed at 05/21/13 1700  Gross per 24 hour  Intake    480 ml  Output      0 ml  Net    480 ml    Inpatient Medications:  . aspirin EC  81 mg Oral Daily  . atorvastatin  10 mg Oral q1800  . carvedilol  3.125 mg Oral BID WC  . ferrous sulfate  325 mg Oral Q breakfast  . insulin aspart  0-15 Units Subcutaneous TID WC  . metFORMIN  500 mg Oral BID WC  . multivitamin with minerals  1 tablet Oral Daily  . pantoprazole  20 mg Oral Daily  . sulfamethoxazole-trimethoprim  1 tablet Oral Daily  . tamsulosin  0.4 mg Oral QPC supper  . Ticagrelor  90 mg Oral BID    Labs:  Recent Labs  05/21/13 1047 05/22/13 0445  NA 133* 136  K 4.4 4.0  CL 97 100  CO2 23 25  GLUCOSE 212* 198*  BUN 22 16  CREATININE 1.13 0.96  CALCIUM 9.9 8.9    Recent Labs  05/22/13 0445  AST 25  ALT 26  ALKPHOS 68  BILITOT 0.2*  PROT 6.5  ALBUMIN 3.4*    Recent Labs  05/21/13 1047  WBC 6.5  HGB 13.1  HCT 39.1  MCV 92.4  PLT 145*    Recent Labs  05/21/13 1047 05/21/13 1816 05/21/13 2215 05/22/13 0445  TROPONINI <0.30 <0.30 <0.30 <0.30    Recent Labs  05/22/13 0445  CHOL 169  HDL 33*  LDLCALC 90  TRIG 960*  CHOLHDL 5.1    Radiology/Studies Dg Chest 2 View 05/21/2013      IMPRESSION: No acute cardiopulmonary disease.    Electronically Signed   By: Elberta Fortis M.D.   On: 05/21/2013 11:22   Echocardiogram 05/02/2013 Study Conclusions - Left ventricle: Wall thickness was increased in a pattern of mild LVH. Systolic function was low normal. The estimated ejection fraction is approximately 50%. There was an increased relative contribution of atrial contraction to ventricular filling. Doppler parameters are consistent with abnormal left ventricular relaxation (grade 1 diastolic dysfunction). Doppler parameters are consistent with high ventricular filling pressure. - Regional wall motion abnormality: Moderate hypokinesis of the basal-mid inferolateral myocardium; mild hypokinesis of the basal inferoseptal and basal-mid anterolateral myocardium. - Aortic valve: Trileaflet; mildly thickened, mildly calcified leaflets. There was no stenosis. - Mitral valve: Calcified annulus. Trivial regurgitation. -  Left atrium: The atrium was mildly dilated.  Telemetry: SR; no arrhythmias   Assessment and Plan  1. Chest pain  - resolved - serial troponin negative 2. Known multivessel CAD s/p CABG and subsequent PCIs  - last cath April 2014 demonstrated patent stent sites and no significant changes in graft anatomy 3. History of orthostatic hypotension - Imdur and Coreg discontinued previously due to hypotension - added back low dose Coreg yesterday  - orthostatic VS were ordered but for some reason not done; notified RN today that these need to be done this AM 4. Ischemic CM, EF 40% previously, now improved to 50% by echo Oct 2014 -  continue medical therapy as BP allows 5. DM - stable; on metformin and SSI   Dr. Diona Browner to see Signed, Rick Duff PA-C  Attending note:  Patient seen and examined. He has had no further chest pain, states ready to go home. Troponin I levels all normal and ECG without acute ST changes. We added low dose Coreg back to regimen and he has tolerated so far. OK for D/C home today. Arrange office visit in approximately 2 weeks.  Jonelle Sidle, M.D., F.A.C.C.\

## 2013-05-22 NOTE — Discharge Summary (Signed)
Please see rounding note as well. 

## 2013-05-22 NOTE — Discharge Summary (Signed)
CARDIOLOGY DISCHARGE SUMMARY    Patient ID: Jeffrey [name],  MRN: [id number], DOB/AGE: 21-Jul-1928 79 y.o.  Admit date: 05/21/2013 Discharge date: 05/22/2013  Primary Care Physician: Elfredia Nevins, MD  Primary Cardiologist: Dietrich Pates, MD  Primary Discharge Diagnosis:  1. Chest pain  - resolved  - ECG shows no acute changes and serial troponin negative   Secondary Discharge Diagnoses:  1. Known multivessel CAD s/p CABG and subsequent PCIs  - last cath April 2014 demonstrated patent stent sites and no significant changes in graft anatomy  2. History of orthostatic hypotension  - Imdur and Coreg discontinued previously due to hypotension  - added back low dose Coreg yesterday and appears to be tolerating well  3. Ischemic CM, EF 40% previously, now improved to 50% by echo Oct 2014  - continue medical therapy as BP allows  4. DM   Procedures This Admission:  None   History and Hospital Course:  Jeffrey [name] is a pleasant 79 year old man with known multivessel CAD s/p CABG and subsequent PCIs and ischemic CM (EF 40% previously now improved to 50% by recent echo) who presented with CP yesterday. He had a 15-minute episode of nonexertional CP on 05/21/2013. He did not take nitroglycerin. It subsided on its own. However, given his cardiac history, he decided to seek medical attention at [facility]. His initial troponin was negative. ECG showed sinus rhythm with nonspecific ST segment changes. His chest x-ray was without evidence of pulmonary edema. He was transferred to [facility] for further evaluation. He was observed overnight on telemetry. He had no recurrent chest pain. Serial troponins are all negative. Telemetry reveals SR without arrhythmias. Low dose Coreg was added back to his medical regimen. He remains pain free and hemodynamically stable. He is eager to go home. He was seen, evaluated and deemed stable for discharge home today by Dr. Nona Dell. He will follow-up in our  office in 1-2 weeks.  Discharge Vitals: Blood pressure 124/65, pulse 83, temperature 98.1 F (36.7 C), temperature source Oral, resp. rate 18, height 5\' 10"  (1.778 m), weight 180 lb 0.2 oz (81.654 kg), SpO2 99.00%.   Labs: Lab Results  Component Value Date   WBC 6.5 05/21/2013   HGB 13.1 05/21/2013   HCT 39.1 05/21/2013   MCV 92.4 05/21/2013   PLT 145* 05/21/2013    Recent Labs Lab 05/22/13 0445  NA 136  K 4.0  CL 100  CO2 25  BUN 16  CREATININE 0.96  CALCIUM 8.9  PROT 6.5  BILITOT 0.2*  ALKPHOS 68  ALT 26  AST 25  GLUCOSE 198*   Lab Results  Component Value Date   CKTOTAL 206 03/11/2012   CKMB 8.0* 03/11/2012   TROPONINI <0.30 05/22/2013    Lab Results  Component Value Date   CHOL 169 05/22/2013   CHOL 124 10/07/2012   CHOL 113 06/19/2012   Lab Results  Component Value Date   HDL 33* 05/22/2013   HDL 31* 10/07/2012   HDL 40 06/19/2012   Lab Results  Component Value Date   LDLCALC 90 05/22/2013   LDLCALC 51 10/07/2012   LDLCALC 40 06/19/2012   Lab Results  Component Value Date   TRIG 230* 05/22/2013   TRIG 211* 10/07/2012   TRIG 165* 06/19/2012   Lab Results  Component Value Date   CHOLHDL 5.1 05/22/2013   CHOLHDL 4.0 10/07/2012   CHOLHDL 2.8 06/19/2012    Disposition:  The patient is being discharged in stable  condition.  Follow-up: Follow-up Information   Follow up with Joni Reining, NP On 05/30/2013. (At 1:30 PM)    Specialty:  Nurse Practitioner   Contact information:   [address] [place] [address] [phone]      Discharge Medications:    Medication List         albuterol 108 (90 BASE) MCG/ACT inhaler  Commonly known as:  PROVENTIL HFA;VENTOLIN HFA  Inhale 2 puffs into the lungs every 6 (six) hours as needed for wheezing or shortness of breath.     aspirin 325 MG tablet  Take 325 mg by mouth daily.     carvedilol 3.125 MG tablet  Commonly known as:  COREG  Take 1 tablet (3.125 mg total) by mouth 2 (two) times daily with a  meal.     ferrous sulfate 325 (65 FE) MG tablet  Take 325 mg by mouth daily with breakfast.     furosemide 20 MG tablet  Commonly known as:  LASIX  Take 20 mg by mouth daily as needed for fluid. Take 1 tablet as needed if 3 lb weight gain.     loperamide 2 MG capsule  Commonly known as:  IMODIUM  Take 2 mg by mouth as needed for diarrhea or loose stools.     metFORMIN 500 MG tablet  Commonly known as:  GLUCOPHAGE  Take 500 mg by mouth 2 (two) times daily with a meal.     multivitamin with minerals Tabs tablet  Take 1 tablet by mouth daily.     nitroGLYCERIN 0.4 MG SL tablet  Commonly known as:  NITROSTAT  Place 1 tablet (0.4 mg total) under the tongue every 5 (five) minutes x 3 doses as needed for chest pain.     pantoprazole 20 MG tablet  Commonly known as:  PROTONIX  Take 20 mg by mouth daily.     potassium chloride SA 20 MEQ tablet  Commonly known as:  K-DUR,KLOR-CON  Take 20 mEq by mouth daily as needed (Takes only with Lasix).     sulfamethoxazole-trimethoprim 400-80 MG per tablet  Commonly known as:  BACTRIM,SEPTRA  Take 1 tablet by mouth daily.     tamsulosin 0.4 MG Caps capsule  Commonly known as:  FLOMAX  Take 0.4 mg by mouth daily after supper.     Ticagrelor 90 MG Tabs tablet  Commonly known as:  BRILINTA  Take 1 tablet (90 mg total) by mouth 2 (two) times daily.       Duration of Discharge Encounter: Greater than 30 minutes including physician time.  Signed, Rick Duff, PA-C 05/22/2013, 9:55 AM

## 2013-05-23 ENCOUNTER — Telehealth: Payer: Self-pay | Admitting: Adult Health

## 2013-05-23 NOTE — Telephone Encounter (Signed)
Received fax refill request  Rx # [id number]  Medication:  Proair HFA Inhaler 90 mcg Qty 8.50 gm Sig:  Inhale 1 to 2 puffs every 6 hours as needed Physician:  Lyman Bishop

## 2013-05-23 NOTE — Telephone Encounter (Signed)
PCP needs to refill. KL,NP did this for one time curtesy until seen by PCP.

## 2013-05-28 ENCOUNTER — Encounter (HOSPITAL_COMMUNITY): Payer: Self-pay | Admitting: Emergency Medicine

## 2013-05-28 ENCOUNTER — Emergency Department (HOSPITAL_COMMUNITY): Payer: Medicare Other

## 2013-05-28 ENCOUNTER — Inpatient Hospital Stay (HOSPITAL_COMMUNITY)
Admission: EM | Admit: 2013-05-28 | Discharge: 2013-05-29 | DRG: 313 | Disposition: A | Discharge status: Home / Self Care | Payer: Medicare Other | Attending: Internal Medicine | Admitting: Internal Medicine

## 2013-05-28 DIAGNOSIS — I249 Acute ischemic heart disease, unspecified: Secondary | ICD-10-CM

## 2013-05-28 DIAGNOSIS — Z951 Presence of aortocoronary bypass graft: Secondary | ICD-10-CM

## 2013-05-28 DIAGNOSIS — E785 Hyperlipidemia, unspecified: Secondary | ICD-10-CM | POA: Diagnosis present

## 2013-05-28 DIAGNOSIS — Z8711 Personal history of peptic ulcer disease: Secondary | ICD-10-CM

## 2013-05-28 DIAGNOSIS — N400 Enlarged prostate without lower urinary tract symptoms: Secondary | ICD-10-CM | POA: Diagnosis present

## 2013-05-28 DIAGNOSIS — Z8673 Personal history of transient ischemic attack (TIA), and cerebral infarction without residual deficits: Secondary | ICD-10-CM

## 2013-05-28 DIAGNOSIS — E119 Type 2 diabetes mellitus without complications: Secondary | ICD-10-CM | POA: Diagnosis present

## 2013-05-28 DIAGNOSIS — I2510 Atherosclerotic heart disease of native coronary artery without angina pectoris: Secondary | ICD-10-CM

## 2013-05-28 DIAGNOSIS — Z7982 Long term (current) use of aspirin: Secondary | ICD-10-CM

## 2013-05-28 DIAGNOSIS — I255 Ischemic cardiomyopathy: Secondary | ICD-10-CM | POA: Diagnosis present

## 2013-05-28 DIAGNOSIS — I2589 Other forms of chronic ischemic heart disease: Secondary | ICD-10-CM

## 2013-05-28 DIAGNOSIS — Z9861 Coronary angioplasty status: Secondary | ICD-10-CM

## 2013-05-28 DIAGNOSIS — M549 Dorsalgia, unspecified: Secondary | ICD-10-CM | POA: Diagnosis present

## 2013-05-28 DIAGNOSIS — M1990 Unspecified osteoarthritis, unspecified site: Secondary | ICD-10-CM | POA: Diagnosis present

## 2013-05-28 DIAGNOSIS — K219 Gastro-esophageal reflux disease without esophagitis: Secondary | ICD-10-CM | POA: Diagnosis present

## 2013-05-28 DIAGNOSIS — R072 Precordial pain: Principal | ICD-10-CM | POA: Diagnosis present

## 2013-05-28 DIAGNOSIS — G8929 Other chronic pain: Secondary | ICD-10-CM | POA: Diagnosis present

## 2013-05-28 DIAGNOSIS — Y832 Surgical operation with anastomosis, bypass or graft as the cause of abnormal reaction of the patient, or of later complication, without mention of misadventure at the time of the procedure: Secondary | ICD-10-CM | POA: Diagnosis present

## 2013-05-28 DIAGNOSIS — Z8249 Family history of ischemic heart disease and other diseases of the circulatory system: Secondary | ICD-10-CM

## 2013-05-28 DIAGNOSIS — I10 Essential (primary) hypertension: Secondary | ICD-10-CM | POA: Diagnosis present

## 2013-05-28 DIAGNOSIS — Z79899 Other long term (current) drug therapy: Secondary | ICD-10-CM

## 2013-05-28 DIAGNOSIS — T82897A Other specified complication of cardiac prosthetic devices, implants and grafts, initial encounter: Secondary | ICD-10-CM | POA: Diagnosis present

## 2013-05-28 DIAGNOSIS — Z87891 Personal history of nicotine dependence: Secondary | ICD-10-CM

## 2013-05-28 DIAGNOSIS — I951 Orthostatic hypotension: Secondary | ICD-10-CM | POA: Diagnosis present

## 2013-05-28 LAB — CBC WITH DIFFERENTIAL/PLATELET
Eosinophils Absolute: 0.4 10*3/uL (ref 0.0–0.7)
Hemoglobin: 12.6 g/dL — ABNORMAL LOW (ref 13.0–17.0)
Lymphocytes Relative: 26 % (ref 12–46)
Lymphs Abs: 1.8 10*3/uL (ref 0.7–4.0)
MCH: 31 pg (ref 26.0–34.0)
MCHC: 33.6 g/dL (ref 30.0–36.0)
Monocytes Relative: 9 % (ref 3–12)
Neutro Abs: 4.1 10*3/uL (ref 1.7–7.7)
Neutrophils Relative %: 59 % (ref 43–77)
Platelets: 157 10*3/uL (ref 150–400)
RBC: 4.06 MIL/uL — ABNORMAL LOW (ref 4.22–5.81)
WBC: 7 10*3/uL (ref 4.0–10.5)

## 2013-05-28 LAB — TROPONIN I: Troponin I: <0.3 ng/mL (ref <0.30)

## 2013-05-28 LAB — COMPREHENSIVE METABOLIC PANEL
AST: 26 U/L (ref 0–37)
Alkaline Phosphatase: 71 U/L (ref 39–117)
BUN: 19 mg/dL (ref 6–23)
CO2: 24 mEq/L (ref 19–32)
Chloride: 93 mEq/L — ABNORMAL LOW (ref 96–112)
GFR calc Af Amer: 72 mL/min — ABNORMAL LOW (ref >90)
GFR calc non Af Amer: 62 mL/min — ABNORMAL LOW (ref >90)
Glucose, Bld: 216 mg/dL — ABNORMAL HIGH (ref 70–99)
Potassium: 4 mEq/L (ref 3.5–5.1)
Sodium: 128 mEq/L — ABNORMAL LOW (ref 135–145)
Total Bilirubin: <0.1 mg/dL — ABNORMAL LOW (ref 0.3–1.2)

## 2013-05-28 MED ORDER — HEPARIN BOLUS VIA INFUSION
4000.0000 [IU] | Freq: Once | INTRAVENOUS | Status: AC
  Administered 2013-05-28: 4000 [IU] via INTRAVENOUS

## 2013-05-28 MED ORDER — ONDANSETRON HCL 4 MG/2ML IJ SOLN: 4.0000 mg | Freq: Three times a day (TID) | INTRAMUSCULAR | Status: DC | PRN

## 2013-05-28 MED ORDER — HEPARIN (PORCINE) IN NACL 100-0.45 UNIT/ML-% IJ SOLN
1100.0000 [IU]/h | INTRAMUSCULAR | Status: DC
  Administered 2013-05-28: 950 [IU]/h via INTRAVENOUS
  Administered 2013-05-29: 1100 [IU]/h via INTRAVENOUS
  Filled 2013-05-28 (×2): qty 250

## 2013-05-28 MED ORDER — ASPIRIN 81 MG PO CHEW: 324.0000 mg | CHEWABLE_TABLET | Freq: Once | ORAL | Status: DC

## 2013-05-28 NOTE — ED Provider Notes (Signed)
CSN: [id number]     Arrival date & time 05/28/13  1716 History   First MD Initiated Contact with Patient 05/28/13 1726     Chief Complaint  Patient presents with  . Chest Pain   (Consider location/radiation/quality/duration/timing/severity/associated sxs/prior Treatment) HPI Comments: Patient with known history of CAD status post CABG and multiple stents presenting with substernal chest pain onset today at rest. He took some TUMS, nitroglycerin, and water in the pain relieved itself after about 20 minutes. Pain is similar to his previous angina. Denies any diaphoresis, nausea, vomiting. He did have some shortness of breath. No history of PE or DVT. No lower extremity swelling or pain. He was admitted on November 8 for similar symptoms and discharged the next day. His last catheterization was April 2014.  The history is provided by the patient.    Past Medical History  Diagnosis Date  . Diabetes mellitus, type II   . Hyperlipidemia     Lipid profile in 02/2012:135, 227, 41, 49  . Coronary atherosclerosis of native coronary artery     a. CABG x 4 in 1989 (VG->OM1->OM2, VG->RCA, LIMA->LAD), b. 05/2010: DES to VG-OM1/OM2, DES to distal LCx. c. NSTEMI in 04/2011 - TO distal LCX stent and VG->OM2. d. 02/2012 NSTEMI DES to VG-OM1/continuation to OM2 occluded. e. inferior STEMI s/p DES to SVG-RAMUS 06/2012. f. inferolat STEMI 09/2012 s/p DES to SVG-interm.  . Essential hypertension, benign   . Osteoarthritis   . History of stroke   . History of pneumonia   . Cervical vertebral fracture   . Chronic back pain   . Benign prostatic hypertrophy     History of urinary retention  . Peptic ulcer disease   . Gastroesophageal reflux disease   . Ischemic cardiomyopathy     LVEF 45-50%   Past Surgical History  Procedure Laterality Date  . Tonsillectomy    . Coronary artery bypass graft     Family History  Problem Relation Age of Onset  . Early death      Parents died young  . Appendicitis Mother      Pt was 79 year old  . Heart attack Father 65   History  Substance Use Topics  . Smoking status: Former Smoker -- 2.00 packs/day for 10 years    Types: Cigarettes    Quit date: 07/14/1961  . Smokeless tobacco: Current User    Types: Chew  . Alcohol Use: No    Review of Systems  Constitutional: Negative for activity change and appetite change.  Respiratory: Positive for chest tightness and shortness of breath. Negative for cough.   Cardiovascular: Positive for chest pain. Negative for leg swelling.  Gastrointestinal: Positive for nausea. Negative for vomiting, abdominal pain and constipation.  Genitourinary: Negative for dysuria and hematuria.  Musculoskeletal: Negative for back pain and neck pain.  Skin: Negative for rash.  Neurological: Negative for dizziness, light-headedness, numbness and headaches.  A complete 10 system review of systems was obtained and all systems are negative except as noted in the HPI and PMH.    Allergies  Review of patient's allergies indicates no known allergies.  Home Medications   No current outpatient prescriptions on file. BP 115/65  Pulse 68  Temp(Src) 98.2 F (36.8 C) (Oral)  Resp 18  Ht 5\' 10"  (1.778 m)  Wt 180 lb (81.647 kg)  BMI 25.83 kg/m2  SpO2 97% Physical Exam  Constitutional: He is oriented to person, place, and time. He appears well-developed and well-nourished. No distress.  HENT:  Head: Normocephalic and atraumatic.  Mouth/Throat: Oropharynx is clear and moist. No oropharyngeal exudate.  Eyes: Conjunctivae and EOM are normal. Pupils are equal, round, and reactive to light.  Neck: Normal range of motion. Neck supple.  Cardiovascular: Normal rate, regular rhythm, normal heart sounds and intact distal pulses.   No murmur heard. Pulmonary/Chest: Effort normal and breath sounds normal. No respiratory distress.  Abdominal: Soft. There is no tenderness. There is no rebound and no guarding.  Musculoskeletal: Normal range of  motion. He exhibits no edema and no tenderness.  Neurological: He is alert and oriented to person, place, and time. No cranial nerve deficit. He exhibits normal muscle tone. Coordination normal.  Skin: Skin is warm.    ED Course  Procedures (including critical care time) Labs Review Labs Reviewed  CBC WITH DIFFERENTIAL - Abnormal; Notable for the following:    RBC 4.06 (*)    Hemoglobin 12.6 (*)    HCT 37.5 (*)    Eosinophils Relative 6 (*)    All other components within normal limits  COMPREHENSIVE METABOLIC PANEL - Abnormal; Notable for the following:    Sodium 128 (*)    Chloride 93 (*)    Glucose, Bld 216 (*)    Total Bilirubin <0.1 (*)    GFR calc non Af Amer 62 (*)    GFR calc Af Amer 72 (*)    All other components within normal limits  TROPONIN I  TROPONIN I  HEPARIN LEVEL (UNFRACTIONATED)  CBC   Imaging Review Dg Chest Portable 1 View  05/28/2013   CLINICAL DATA:  Chest pain  EXAM: PORTABLE CHEST - 1 VIEW  COMPARISON:  05/21/2013; 04/17/2013; 11/26/2012  FINDINGS: Grossly unchanged cardiac silhouette and mediastinal contours post median sternotomy and CABG. Minimal atherosclerotic plaque within the thoracic aorta. Mild cephalization of flow without frank evidence of edema, possibly artifactual/accentuated due to portable technique. Minimal perihilar atelectasis. No focal airspace opacities. No pleural effusion or pneumothorax. Grossly unchanged bones.  IMPRESSION: No definite acute cardiopulmonary disease on this [facility] portable examination. Further evaluation with a PA and lateral chest radiograph may be obtained as clinically indicated.   Electronically Signed   By: Simonne Come M.D.   On: 05/28/2013 17:49    EKG Interpretation     Ventricular Rate:  79 PR Interval:  188 QRS Duration: 96 QT Interval:  382 QTC Calculation: 438 R Axis:   86 Text Interpretation:  Normal sinus rhythm ST \\T \ T wave abnormality, consider lateral ischemia Abnormal ECG When compared with ECG  of 22-May-2013 05:39, Nonspecific T wave abnormality has replaced inverted T waves in Inferior leads lateral T wave inversions            MDM   1. Acute coronary syndrome    Substernal chest pain similar to previous angina that onset at rest. Relieved with TUMS and nitroglycerin. Admission last week for similar symptoms. Chest pain free now, equal distal pulses.  Catheterization April 2014 reviewed. Final Conclusions:  1. Severe three-vessel obstructive coronary disease.  2. The saphenous vein graft to the ramus intermediate branch is patent.  3. Patent LIMA graft to the LAD.  4. Occluded saphenous vein graft to the obtuse marginal vessels.  5. Good left ventricular function.  Chest pain-free on arrival. EKG shows T-wave inversions in V5 and V6 stable T wave inversions inferior leads. Troponin is negative.  D/w cardiology fellow Dr. Donnetta Simpers. Given patient's history and lack of cardiology coverage at [facility], will admit for rule out.  Heparin  started for possible ACS.  Patient remains pain free.  CRITICAL CARE Performed by: Glynn Octave Total critical care time: 30 Critical care time was exclusive of separately billable procedures and treating other patients. Critical care was necessary to treat or prevent imminent or life-threatening deterioration. Critical care was time spent personally by me on the following activities: development of treatment plan with patient and/or surrogate as well as nursing, discussions with consultants, evaluation of patient's response to treatment, examination of patient, obtaining history from patient or surrogate, ordering and performing treatments and interventions, ordering and review of laboratory studies, ordering and review of radiographic studies, pulse oximetry and re-evaluation of patient's condition.    Glynn Octave, MD 05/28/13 [phone]

## 2013-05-28 NOTE — ED Notes (Signed)
Patient w/recurrent chest pain began having aching pain at 1630.  Took multiple OTCs for indigestion w/out relief.  Took NTG tab x 1 and came to ER.  Has taken 3- 325mg  ASAs today.  States pain is relieved.

## 2013-05-28 NOTE — Progress Notes (Signed)
ANTICOAGULATION CONSULT NOTE - Initial Consult  Pharmacy Consult for Heparin Indication: chest pain/ACS  No Known Allergies  Patient Measurements: Height: 5\' 10"  (177.8 cm) Weight: 180 lb (81.647 kg) IBW/kg (Calculated) : 73  Vital Signs: Temp: 97.7 F (36.5 C) (11/15 1748) Temp src: Oral (11/15 1748) BP: 104/67 mmHg (11/15 1905) Pulse Rate: 69 (11/15 1905)  Labs:  Recent Labs  05/28/13 1750  HGB 12.6*  HCT 37.5*  PLT 157  CREATININE 1.08  TROPONINI <0.30    Estimated Creatinine Clearance: 54.4 ml/min (by C-G formula based on Cr of 1.08).   Medical History: Past Medical History  Diagnosis Date  . Diabetes mellitus, type II   . Hyperlipidemia     Lipid profile in 02/2012:135, 227, 41, 49  . Coronary atherosclerosis of native coronary artery     a. CABG x 4 in 1989 (VG->OM1->OM2, VG->RCA, LIMA->LAD), b. 05/2010: DES to VG-OM1/OM2, DES to distal LCx. c. NSTEMI in 04/2011 - TO distal LCX stent and VG->OM2. d. 02/2012 NSTEMI DES to VG-OM1/continuation to OM2 occluded. e. inferior STEMI s/p DES to SVG-RAMUS 06/2012. f. inferolat STEMI 09/2012 s/p DES to SVG-interm.  . Essential hypertension, benign   . Osteoarthritis   . History of stroke   . History of pneumonia   . Cervical vertebral fracture   . Chronic back pain   . Benign prostatic hypertrophy     History of urinary retention  . Peptic ulcer disease   . Gastroesophageal reflux disease   . Ischemic cardiomyopathy     LVEF 45-50%    Medications:  Scheduled:  . aspirin  324 mg Oral Once    Assessment: 79 yo M with hx CAD presents with chest pain.  He presented to ED 11/8-9 with the same.  No bleeding noted.  Platelets are at low end of goal range which is patient's baseline.  Goal of Therapy:  Heparin level 0.3-0.7 units/ml Monitor platelets by anticoagulation protocol: Yes   Plan:  Give 4000 units bolus x 1 Start heparin infusion at 950 units/hr Check anti-Xa level in 8 hours and daily while on  heparin Continue to monitor H&H and platelets  Alonte Wulff, [facility] 05/28/2013,7:35 PM

## 2013-05-28 NOTE — H&P (Signed)
Cardiology Consultation Note  Patient ID: Jeffrey [name], MRN: [id number], DOB/AGE: 12-10-1928 79 y.o. Admit date: 05/28/2013   Date of Consult: 05/28/2013 Primary Physician: [name], MD Primary Cardiologist: Duffy Bruce   Chief Complaint: Chest pain  Reason for admission ; UA   Assessment and Plan:  Ass - Precordial chest pain atypical - Ischemic CM, EF 40% previously, now improved to 50% by echo Oct 2014  - DM   Plan  - rule out for ACS.  - pt education done regarding cardiac symptoms  - continue current medication regimen for now  - may consider stress test  further evaluation/ reassurance since this is pt second visit in 1 week   HPI:  79.male with hx of  DM , orthostatic hypotension , CAD ( s/p CABGand PCI with  LHC in 10/2012 showing patent SVG to ramus including the stented segment, patent LIMA to LAD, occluded SVG to OM )  ischemic CM (EF 40% previously now 04/26/2013 improved to 50%  by recent echo grade 1 diastolic dysfunction with increased filling pressures, hypokinesis of the inferolateral myocardium, also inferoseptal and basal anterolateral myocardium )     States that earlier in the day he had episode of ' indigestion ' with substernal burn . He took tums , pepto and SL NTG and with all 3 his symptom went away. Pt states this was the symptom that resulted in his PCI in the past which is why he was worried and came into the ER. Prior to his CABG his symptoms were numbness across his chest  He has no symptoms with exertion although his ADL are limited by his arthritis. He does not take SL ntg on a regular basis.  Of note pt  He reports compliance with his medications. He lives alone in an apartment.  Past Medical History  Diagnosis Date  . Diabetes mellitus, type II   . Hyperlipidemia     Lipid profile in 02/2012:135, 227, 41, 49  . Coronary atherosclerosis of native coronary artery     a. CABG x 4 in 1989 (VG->OM1->OM2, VG->RCA, LIMA->LAD), b.  05/2010: DES to VG-OM1/OM2, DES to distal LCx. c. NSTEMI in 04/2011 - TO distal LCX stent and VG->OM2. d. 02/2012 NSTEMI DES to VG-OM1/continuation to OM2 occluded. e. inferior STEMI s/p DES to SVG-RAMUS 06/2012. f. inferolat STEMI 09/2012 s/p DES to SVG-interm.  . Essential hypertension, benign   . Osteoarthritis   . History of stroke   . History of pneumonia   . Cervical vertebral fracture   . Chronic back pain   . Benign prostatic hypertrophy     History of urinary retention  . Peptic ulcer disease   . Gastroesophageal reflux disease   . Ischemic cardiomyopathy     LVEF 45-50%       Surgical History:  Past Surgical History  Procedure Laterality Date  . Tonsillectomy    . Coronary artery bypass graft       Home Meds: Prior to Admission medications   Medication Sig Start Date End Date Taking? Authorizing Provider  albuterol (PROVENTIL HFA;VENTOLIN HFA) 108 (90 BASE) MCG/ACT inhaler Inhale 2 puffs into the lungs every 6 (six) hours as needed for wheezing or shortness of breath.    Historical Provider, MD  aspirin 325 MG tablet Take 325 mg by mouth daily.    Historical Provider, MD  carvedilol (COREG) 3.125 MG tablet Take 1 tablet (3.125 mg total) by mouth 2 (two) times daily with a meal. 05/22/13  Brooke O Edmisten, PA-C  ferrous sulfate 325 (65 FE) MG tablet Take 325 mg by mouth daily with breakfast.    Historical Provider, MD  furosemide (LASIX) 20 MG tablet Take 20 mg by mouth daily as needed for fluid. Take 1 tablet as needed if 3 lb weight gain. 05/06/13   Jodelle Gross, NP  loperamide (IMODIUM) 2 MG capsule Take 2 mg by mouth as needed for diarrhea or loose stools.  02/28/13   Historical Provider, MD  metFORMIN (GLUCOPHAGE) 500 MG tablet Take 500 mg by mouth 2 (two) times daily with a meal.    Historical Provider, MD  Multiple Vitamin (MULTIVITAMIN WITH MINERALS) TABS Take 1 tablet by mouth daily.    Historical Provider, MD  nitroGLYCERIN (NITROSTAT) 0.4 MG SL tablet Place 1  tablet (0.4 mg total) under the tongue every 5 (five) minutes x 3 doses as needed for chest pain. 11/11/12 11/11/13  Rhonda G Barrett, PA-C  pantoprazole (PROTONIX) 20 MG tablet Take 20 mg by mouth daily.  01/27/13   Historical Provider, MD  potassium chloride SA (K-DUR,KLOR-CON) 20 MEQ tablet Take 20 mEq by mouth daily as needed (Takes only with Lasix). 04/28/13   Jodelle Gross, NP  sulfamethoxazole-trimethoprim (BACTRIM,SEPTRA) 400-80 MG per tablet Take 1 tablet by mouth daily. 05/02/13   Historical Provider, MD  tamsulosin (FLOMAX) 0.4 MG CAPS Take 0.4 mg by mouth daily after supper.  01/27/13   Historical Provider, MD  Ticagrelor (BRILINTA) 90 MG TABS tablet Take 1 tablet (90 mg total) by mouth 2 (two) times daily. 06/21/12   Gery Pray, PA-C    Inpatient Medications:  . aspirin  324 mg Oral Once      Allergies: No Known Allergies  History   Social History  . Marital Status: Divorced    Spouse Name: N/A    Number of Children: N/A  . Years of Education: N/A   Occupational History  . Retired     Probation officer   Social History Main Topics  . Smoking status: Former Smoker -- 2.00 packs/day for 10 years    Types: Cigarettes    Quit date: 07/14/1961  . Smokeless tobacco: Current User    Types: Chew  . Alcohol Use: No  . Drug Use: No  . Sexual Activity: No   Other Topics Concern  . Not on file   Social History Narrative   ** Merged History Encounter **...   He was raised by his uncle.  His mother deceased when   he was 9 year old with appendicitis. Father deceased from an MI at age   78.  He has 1 sister, but he does not know her health status as they   have been separated.            Family History  Problem Relation Age of Onset  . Early death      Parents died young  . Appendicitis Mother     Pt was 66 year old  . Heart attack Father 40     Review of Systems General: negative for chills, fever, night sweats or weight changes.  Cardiovascular: ngeative , edema,  orthopnea, palpitations, paroxysmal nocturnal dyspnea, shortness of breath or dyspnea on exertion Dermatological: negative for rash Respiratory: negative for cough or wheezing Urologic: negative for hematuria Abdominal: negative for nausea, vomiting, diarrhea, bright red blood per rectum, melena, or hematemesis Neurologic: negative for visual changes, syncope, or dizziness All other systems reviewed and are otherwise negative except as noted  above.  Labs:  Recent Labs  05/28/13 1750  TROPONINI <0.30   Lab Results  Component Value Date   WBC 7.0 05/28/2013   HGB 12.6* 05/28/2013   HCT 37.5* 05/28/2013   MCV 92.4 05/28/2013   PLT 157 05/28/2013    Recent Labs Lab 05/28/13 1750  NA 128*  K 4.0  CL 93*  CO2 24  BUN 19  CREATININE 1.08  CALCIUM 9.6  PROT 6.8  BILITOT <0.1*  ALKPHOS 71  ALT 27  AST 26  GLUCOSE 216*   Lab Results  Component Value Date   CHOL 169 05/22/2013   HDL 33* 05/22/2013   LDLCALC 90 05/22/2013   TRIG 230* 05/22/2013   Lab Results  Component Value Date   DDIMER  Value: 0.46        AT THE INHOUSE ESTABLISHED CUTOFF VALUE OF 0.48 ug/mL FEU, THIS ASSAY HAS BEEN DOCUMENTED IN THE LITERATURE TO HAVE A SENSITIVITY AND NEGATIVE PREDICTIVE VALUE OF AT LEAST 98 TO 99%.  THE TEST RESULT SHOULD BE CORRELATED WITH AN ASSESSMENT OF THE CLINICAL PROBABILITY OF DVT / VTE. 08/03/2010    Radiology/Studies:  Dg Chest 2 View  05/21/2013   CLINICAL DATA:  Chest pain.  EXAM: CHEST  2 VIEW  COMPARISON:  04/17/2013 and 05/17/2012  FINDINGS: Sternotomy wires are unchanged. Lungs are adequately inflated without consolidation or effusion. Cardiomediastinal silhouette is within normal. There is calcified plaque over the thoracoabdominal aorta. There spondylosis of the spine.  IMPRESSION: No acute cardiopulmonary disease.   Electronically Signed   By: Elberta Fortis M.D.   On: 05/21/2013 11:22   Dg Chest Portable 1 View  05/28/2013   CLINICAL DATA:  Chest pain  EXAM: PORTABLE  CHEST - 1 VIEW  COMPARISON:  05/21/2013; 04/17/2013; 11/26/2012  FINDINGS: Grossly unchanged cardiac silhouette and mediastinal contours post median sternotomy and CABG. Minimal atherosclerotic plaque within the thoracic aorta. Mild cephalization of flow without frank evidence of edema, possibly artifactual/accentuated due to portable technique. Minimal perihilar atelectasis. No focal airspace opacities. No pleural effusion or pneumothorax. Grossly unchanged bones.  IMPRESSION: No definite acute cardiopulmonary disease on this [facility] portable examination. Further evaluation with a PA and lateral chest radiograph may be obtained as clinically indicated.   Electronically Signed   By: Simonne Come M.D.   On: 05/28/2013 17:49    EKG: NSR , inferolateral TW flattening   Physical Exam: Blood pressure 104/67, pulse 69, temperature 97.7 F (36.5 C), temperature source Oral, resp. rate 16, height 5\' 10"  (1.778 m), weight 81.647 kg (180 lb), SpO2 96.00%. General: Well developed, well nourished, in no acute distress. Head: Normocephalic, atraumatic, sclera non-icteric, no xanthomas, nares are without discharge.  Neck: Negative for carotid bruits. JVD not elevated. Lungs: Clear bilaterally to auscultation without wheezes, rales, or rhonchi. Breathing is unlabored. Heart: mid line chest scar RRR with S1 S2. No murmurs, rubs, or gallops appreciated. Abdomen: Soft, non-tender, non-distended with normoactive bowel sounds. No hepatomegaly. No rebound/guarding. No obvious abdominal masses. Msk:  Strength and tone appear normal for age.  Extremities: No clubbing or cyanosis. No edema.  Distal pedal pulses are 2+ and equal bilaterally. Psych:  Responds to questions appropriately with a normal affect.       Signed, Deborah Chalk, A PA-C 05/28/2013, 7:35 PM

## 2013-05-29 ENCOUNTER — Observation Stay (HOSPITAL_COMMUNITY): Payer: Medicare Other

## 2013-05-29 ENCOUNTER — Encounter (HOSPITAL_COMMUNITY): Payer: Self-pay | Admitting: Physician Assistant

## 2013-05-29 DIAGNOSIS — I2510 Atherosclerotic heart disease of native coronary artery without angina pectoris: Secondary | ICD-10-CM

## 2013-05-29 DIAGNOSIS — R072 Precordial pain: Secondary | ICD-10-CM | POA: Diagnosis present

## 2013-05-29 DIAGNOSIS — R079 Chest pain, unspecified: Secondary | ICD-10-CM

## 2013-05-29 DIAGNOSIS — I200 Unstable angina: Secondary | ICD-10-CM

## 2013-05-29 LAB — GLUCOSE, CAPILLARY
Glucose-Capillary: 191 mg/dL — ABNORMAL HIGH (ref 70–99)
Glucose-Capillary: 174 mg/dL — ABNORMAL HIGH (ref 70–99)

## 2013-05-29 LAB — CBC
HCT: 39.4 % (ref 39.0–52.0)
Hemoglobin: 13.1 g/dL (ref 13.0–17.0)
MCV: 92.9 fL (ref 78.0–100.0)
Platelets: 140 10*3/uL — ABNORMAL LOW (ref 150–400)
RBC: 4.24 MIL/uL (ref 4.22–5.81)
WBC: 6 10*3/uL (ref 4.0–10.5)

## 2013-05-29 LAB — LIPID PANEL
LDL Cholesterol: 90 mg/dL (ref 0–99)
VLDL: 67 mg/dL — ABNORMAL HIGH (ref 0–40)

## 2013-05-29 LAB — BASIC METABOLIC PANEL
BUN: 15 mg/dL (ref 6–23)
CO2: 23 mEq/L (ref 19–32)
Calcium: 9.2 mg/dL (ref 8.4–10.5)
Creatinine, Ser: 0.97 mg/dL (ref 0.50–1.35)
GFR calc non Af Amer: 75 mL/min — ABNORMAL LOW (ref >90)
Glucose, Bld: 185 mg/dL — ABNORMAL HIGH (ref 70–99)

## 2013-05-29 LAB — TROPONIN I
Troponin I: <0.3 ng/mL (ref <0.30)
Troponin I: <0.3 ng/mL (ref <0.30)
Troponin I: <0.3 ng/mL (ref <0.30)

## 2013-05-29 MED ORDER — REGADENOSON 0.4 MG/5ML IV SOLN
INTRAVENOUS | Status: AC
  Administered 2013-05-29: 0.4 mg
  Filled 2013-05-29: qty 5

## 2013-05-29 MED ORDER — LOPERAMIDE HCL 2 MG PO CAPS: 2.0000 mg | ORAL_CAPSULE | ORAL | Status: DC | PRN

## 2013-05-29 MED ORDER — NITROGLYCERIN 0.4 MG SL SUBL: 0.4000 mg | SUBLINGUAL_TABLET | SUBLINGUAL | Status: DC | PRN

## 2013-05-29 MED ORDER — ALBUTEROL SULFATE HFA 108 (90 BASE) MCG/ACT IN AERS: 2.0000 | INHALATION_SPRAY | Freq: Four times a day (QID) | RESPIRATORY_TRACT | Status: DC | PRN

## 2013-05-29 MED ORDER — ENOXAPARIN SODIUM 40 MG/0.4ML ~~LOC~~ SOLN: 40.0000 mg | SUBCUTANEOUS | Status: DC

## 2013-05-29 MED ORDER — CARVEDILOL 3.125 MG PO TABS
3.1250 mg | ORAL_TABLET | Freq: Two times a day (BID) | ORAL | Status: DC
  Administered 2013-05-29: 3.125 mg via ORAL
  Filled 2013-05-29 (×3): qty 1

## 2013-05-29 MED ORDER — PANTOPRAZOLE SODIUM 20 MG PO TBEC
20.0000 mg | DELAYED_RELEASE_TABLET | Freq: Every day | ORAL | Status: DC
  Administered 2013-05-29: 20 mg via ORAL
  Filled 2013-05-29: qty 1

## 2013-05-29 MED ORDER — POTASSIUM CHLORIDE CRYS ER 20 MEQ PO TBCR: 20.0000 meq | EXTENDED_RELEASE_TABLET | Freq: Every day | ORAL | Status: DC | PRN

## 2013-05-29 MED ORDER — HEPARIN BOLUS VIA INFUSION
2000.0000 [IU] | Freq: Once | INTRAVENOUS | Status: AC
  Administered 2013-05-29: 2000 [IU] via INTRAVENOUS
  Filled 2013-05-29: qty 2000

## 2013-05-29 MED ORDER — ATORVASTATIN CALCIUM 40 MG PO TABS: 40.0000 mg | ORAL_TABLET | Freq: Every day | ORAL | Status: AC

## 2013-05-29 MED ORDER — TECHNETIUM TC 99M SESTAMIBI - CARDIOLITE
30.0000 | Freq: Once | INTRAVENOUS | Status: AC | PRN
  Administered 2013-05-29: 30 via INTRAVENOUS

## 2013-05-29 MED ORDER — ATORVASTATIN CALCIUM 40 MG PO TABS
40.0000 mg | ORAL_TABLET | Freq: Every day | ORAL | Status: DC
  Administered 2013-05-29: 40 mg via ORAL
  Filled 2013-05-29: qty 1

## 2013-05-29 MED ORDER — ASPIRIN 325 MG PO TABS
325.0000 mg | ORAL_TABLET | Freq: Every day | ORAL | Status: DC
  Filled 2013-05-29: qty 1

## 2013-05-29 MED ORDER — BISMUTH SUBSALICYLATE 262 MG/15ML PO SUSP: 30.0000 mL | Freq: Four times a day (QID) | ORAL | Status: DC | PRN

## 2013-05-29 MED ORDER — TICAGRELOR 90 MG PO TABS
90.0000 mg | ORAL_TABLET | Freq: Two times a day (BID) | ORAL | Status: DC
  Administered 2013-05-29: 90 mg via ORAL
  Filled 2013-05-29 (×2): qty 1

## 2013-05-29 MED ORDER — SULFAMETHOXAZOLE-TRIMETHOPRIM 400-80 MG PO TABS
1.0000 | ORAL_TABLET | Freq: Every day | ORAL | Status: DC
  Administered 2013-05-29: 1 via ORAL
  Filled 2013-05-29: qty 1

## 2013-05-29 MED ORDER — ONDANSETRON HCL 4 MG/2ML IJ SOLN: 4.0000 mg | Freq: Four times a day (QID) | INTRAMUSCULAR | Status: DC | PRN

## 2013-05-29 MED ORDER — ALBUTEROL SULFATE HFA 108 (90 BASE) MCG/ACT IN AERS
2.0000 | INHALATION_SPRAY | Freq: Four times a day (QID) | RESPIRATORY_TRACT | Status: DC | PRN
  Filled 2013-05-29: qty 6.7

## 2013-05-29 MED ORDER — TECHNETIUM TC 99M SESTAMIBI GENERIC - CARDIOLITE
10.0000 | Freq: Once | INTRAVENOUS | Status: AC | PRN
  Administered 2013-05-29: 10 via INTRAVENOUS

## 2013-05-29 MED ORDER — ADULT MULTIVITAMIN W/MINERALS CH
1.0000 | ORAL_TABLET | Freq: Every day | ORAL | Status: DC
  Administered 2013-05-29: 1 via ORAL
  Filled 2013-05-29: qty 1

## 2013-05-29 MED ORDER — REGADENOSON 0.4 MG/5ML IV SOLN
INTRAVENOUS | Status: AC
  Filled 2013-05-29: qty 5

## 2013-05-29 MED ORDER — ACETAMINOPHEN 325 MG PO TABS: 650.0000 mg | ORAL_TABLET | ORAL | Status: DC | PRN

## 2013-05-29 MED ORDER — TAMSULOSIN HCL 0.4 MG PO CAPS
0.4000 mg | ORAL_CAPSULE | Freq: Every day | ORAL | Status: DC
Start: 2013-05-29 — End: 2013-05-29
  Administered 2013-05-29: 0.4 mg via ORAL
  Filled 2013-05-29: qty 1

## 2013-05-29 MED ORDER — FUROSEMIDE 20 MG PO TABS
20.0000 mg | ORAL_TABLET | Freq: Every day | ORAL | Status: DC | PRN
  Filled 2013-05-29: qty 1

## 2013-05-29 MED ORDER — FERROUS SULFATE 325 (65 FE) MG PO TABS
325.0000 mg | ORAL_TABLET | Freq: Every day | ORAL | Status: DC
  Filled 2013-05-29 (×2): qty 1

## 2013-05-29 MED ORDER — METFORMIN HCL 500 MG PO TABS
500.0000 mg | ORAL_TABLET | Freq: Two times a day (BID) | ORAL | Status: DC
  Administered 2013-05-29: 500 mg via ORAL
  Filled 2013-05-29 (×3): qty 1

## 2013-05-29 MED ORDER — ASPIRIN EC 81 MG PO TBEC
81.0000 mg | DELAYED_RELEASE_TABLET | Freq: Every day | ORAL | Status: DC
  Administered 2013-05-29: 81 mg via ORAL
  Filled 2013-05-29: qty 1

## 2013-05-29 NOTE — Discharge Summary (Signed)
Discharge Summary   Patient ID: Jeffrey [name], MRN: [id number], DOB/AGE: [birth year] 79 y.o.  Admit date: 05/28/2013 Discharge date: 05/29/2013   Primary Care Physician:  Cassell Smiles   Primary Cardiologist:  Dr. Charlton Bing => Dr. Prentice Docker    Reason for Admission:  Chest Pain   Primary Discharge Diagnoses:  Principal Problem:   Precordial chest pain Active Problems:   Hyperlipidemia   Hypertension   Cardiomyopathy, ischemic   Coronary atherosclerosis of native coronary artery     Wt Readings from Last 3 Encounters:  05/28/13 180 lb (81.647 kg)  05/22/13 180 lb 0.2 oz (81.654 kg)  05/09/13 180 lb (81.647 kg)    Secondary Discharge Diagnoses:   Past Medical History  Diagnosis Date  . Diabetes mellitus, type II   . Hyperlipidemia     Lipid profile in 02/2012:135, 227, 41, 49  . Coronary atherosclerosis of native coronary artery     a. CABG x 4 in 1989 (VG->OM1->OM2, VG->RCA, LIMA->LAD), b. 05/2010: DES to VG-OM1/OM2, DES to distal LCx. c. NSTEMI in 04/2011 - TO distal LCX stent and VG->OM2. d. 02/2012 NSTEMI DES to VG-OM1/continuation to OM2 occluded. e. inferior STEMI s/p DES to SVG-RAMUS 06/2012. f. inferolat STEMI 09/2012 s/p DES to SVG-interm; g. Lex MV (11/14):  EF 35%, inf-lat scar with small peri-infarct ischemia  . Essential hypertension, benign   . Osteoarthritis   . History of stroke   . History of pneumonia   . Cervical vertebral fracture   . Chronic back pain   . Benign prostatic hypertrophy     History of urinary retention  . Peptic ulcer disease   . Gastroesophageal reflux disease   . Ischemic cardiomyopathy     LVEF 45-50%; Echo (04/2013):  mild LVH, EF 50%, Gr 1 DD, inf-lat HK, inf-septal and ant-lat HK, Tr MR, mild LAE      Allergies:   No Known Allergies    Procedures Performed This Admission:   None   Hospital Course:  CARR SHARTZER is a 79 y.o. male with a history of CAD, status post CABG, status post multiple  subsequent PCIs (as outlined in PMH), ischemic cardiomyopathy with EF 40% improved to 50% by recent echo, T2DM, HTN, HL, prior stroke.  He was admitted 11/8-11/9 with chest pain.  Cardiac enzymes remained negative. He was transferred to [facility] from [facility]. His medications were adjusted and no further workup was pursued.  He presented back to the hospital yesterday evening with substernal chest pain described as indigestion relieved by a combination of toms, Pepto-Bismol and sublingual nitroglycerin. He felt the symptoms were consistent with his prior angina. He denied exertional symptoms. He was observed overnight. Cardiac enzymes remained normal.  He was seen by Dr. Patty Sermons this morning. He was pain-free. He was set up for an inpatient Lexiscan Myoview. This was overall low-risk and consistent with his known occluded SVG-OM. He is therefore felt stable for discharge to home with plans for outpatient followup.   Discharge Vitals:   Blood pressure 124/58, pulse 74, temperature 97.9 F (36.6 C), temperature source Oral, resp. rate 20, height 5\' 10"  (1.778 m), weight 180 lb (81.647 kg), SpO2 99.00%.   Labs:   Recent Labs  05/28/13 1750 05/29/13 0410  WBC 7.0 6.0  HGB 12.6* 13.1  HCT 37.5* 39.4  MCV 92.4 92.9  PLT 157 140*     Recent Labs  05/28/13 1750 05/29/13 0410  NA 128* 134*  K 4.0 3.8  CL 93*  100  CO2 24 23  BUN 19 15  CREATININE 1.08 0.97  CALCIUM 9.6 9.2  PROT 6.8  --   BILITOT <0.1*  --   ALKPHOS 71  --   ALT 27  --   AST 26  --      Recent Labs  05/28/13 1750 05/28/13 1929 05/29/13 0410 05/29/13 0820  TROPONINI <0.30 <0.30 <0.30 <0.30    Lab Results  Component Value Date   CHOL 190 05/29/2013   HDL 33* 05/29/2013   LDLCALC 90 05/29/2013   TRIG 335* 05/29/2013     Diagnostic Procedures and Studies:  1. Nm Myocar Multi W/spect W/wall Motion / Ef  05/29/2013   FINDINGS: Fixed defect in the inferior lateral walls noted compatible with  prior infarct. The inferolateral defect is slightly more pronounced on the stress views than the rest. This is suspicious for a small amount of peri-infarct ischemia.  Wall motion:  Global hypokinesis noted with inferior dyskinesis.  Ejection fraction: Calculated QGS ejection fraction is low measuring 35%.  IMPRESSION: Inferior lateral fixed defect with findings suspicious for a small amount of peri-infarct ischemia   Electronically Signed   By: Ruel Favors M.D.   On: 05/29/2013 13:10   2. Dg Chest Portable 1 View  05/28/2013      IMPRESSION: No definite acute cardiopulmonary disease on this AP portable examination. Further evaluation with a PA and lateral chest radiograph may be obtained as clinically indicated.   Electronically Signed   By: Simonne Come M.D.   On: 05/28/2013 17:49    Disposition:   Pt is being discharged home today in good condition.  Follow-up Plans & Appointments      Follow-up Information   Follow up with Prentice Docker A, MD In 2 weeks. (office will call to arrange appointment with Harriet Pho, NP or Dr. Darl Householder)    Specialty:  Cardiology   Contact information:   [address] [place] [address] [phone]       Follow up with [name], MD. (please call for follow up)    Specialty:  Internal Medicine   Contact information:   [address] [place] [address] [phone]       Discharge Medications    Medication List         albuterol 108 (90 BASE) MCG/ACT inhaler  Commonly known as:  PROVENTIL HFA;VENTOLIN HFA  Inhale 2 puffs into the lungs every 6 (six) hours as needed for wheezing or shortness of breath.     albuterol 108 (90 BASE) MCG/ACT inhaler  Commonly known as:  PROVENTIL HFA;VENTOLIN HFA  Inhale 2 puffs into the lungs every 6 (six) hours as needed for wheezing or shortness of breath.     aspirin 325 MG tablet  Take 325 mg by mouth daily.     atorvastatin 40 MG tablet  Commonly known as:   LIPITOR  Take 1 tablet (40 mg total) by mouth daily at 6 PM.     bismuth subsalicylate 262 MG/15ML suspension  Commonly known as:  PEPTO BISMOL  Take 30 mLs by mouth every 6 (six) hours as needed.     carvedilol 3.125 MG tablet  Commonly known as:  COREG  Take 1 tablet (3.125 mg total) by mouth 2 (two) times daily with a meal.     ferrous sulfate 325 (65 FE) MG tablet  Take 325 mg by mouth daily with breakfast.     furosemide 20 MG tablet  Commonly known as:  LASIX  Take 20 mg by mouth daily as needed for fluid. Take 1 tablet as needed if 3 lb weight gain.     loperamide 2 MG capsule  Commonly known as:  IMODIUM  Take 2 mg by mouth as needed for diarrhea or loose stools.     metFORMIN 500 MG tablet  Commonly known as:  GLUCOPHAGE  Take 500 mg by mouth 2 (two) times daily with a meal.     multivitamin with minerals Tabs tablet  Take 1 tablet by mouth daily.     nitroGLYCERIN 0.4 MG SL tablet  Commonly known as:  NITROSTAT  Place 1 tablet (0.4 mg total) under the tongue every 5 (five) minutes x 3 doses as needed for chest pain.     pantoprazole 20 MG tablet  Commonly known as:  PROTONIX  Take 20 mg by mouth daily.     potassium chloride SA 20 MEQ tablet  Commonly known as:  K-DUR,KLOR-CON  Take 20 mEq by mouth daily as needed (Takes only with Lasix).     sulfamethoxazole-trimethoprim 400-80 MG per tablet  Commonly known as:  BACTRIM,SEPTRA  Take 1 tablet by mouth daily with supper.     tamsulosin 0.4 MG Caps capsule  Commonly known as:  FLOMAX  Take 0.4 mg by mouth daily after supper.     Ticagrelor 90 MG Tabs tablet  Commonly known as:  BRILINTA  Take 1 tablet (90 mg total) by mouth 2 (two) times daily.         Outstanding Labs/Studies  1. Lipids and LFTs in 6-8 weeks - can be arranged at follow up visit.   Duration of Discharge Encounter: Greater than 30 minutes including physician and PA time.  Signed, Tereso Newcomer, PA-C   05/29/2013 2:33 PM

## 2013-05-29 NOTE — Progress Notes (Signed)
ANTICOAGULATION CONSULT NOTE - Follow Up Consult  Pharmacy Consult for Heparin  Indication: chest pain/ACS  No Known Allergies  Patient Measurements: Height: 5\' 10"  (177.8 cm) Weight: 180 lb (81.647 kg) IBW/kg (Calculated) : 73  Labs:  Recent Labs  05/28/13 1750 05/28/13 1929 05/29/13 0410  HGB 12.6*  --   --   HCT 37.5*  --   --   PLT 157  --   --   HEPARINUNFRC  --   --  0.15*  CREATININE 1.08  --   --   TROPONINI <0.30 <0.30  --     Estimated Creatinine Clearance: 54.4 ml/min (by C-G formula based on Cr of 1.08).  Medications:  Heparin 950 units/hr  Assessment: 79 y/o M transfer from [facility] on heparin for CP. HL is 0.15. Other labs as above.  Goal of Therapy:  Heparin level 0.3-0.7 units/ml Monitor platelets by anticoagulation protocol: Yes   Plan:  -Heparin 2000 units BOLUS x 1 -Increase heparin drip to 1100 units/hr -8 hour HL at 1400 -Daily CBC/HL -F/U cardiology plans  Thank you for allowing me to take part in this patient's care,  Abran Duke, PharmD Clinical Pharmacist Phone: [phone] Pager: [phone] 05/29/2013 5:21 AM

## 2013-05-29 NOTE — Progress Notes (Signed)
Subjective:  Painfree at present.  Two ER visits within the past week for chest pain. Last cath 4/.2014.  Troponins negative x2.  Objective:  Vital Signs in the last 24 hours: Temp:  [97.7 F (36.5 C)-98.5 F (36.9 C)] 98.5 F (36.9 C) (11/16 0611) Pulse Rate:  [68-73] 73 (11/16 0611) Resp:  [16-18] 18 (11/16 0611) BP: (104-125)/(55-67) 125/64 mmHg (11/16 0611) SpO2:  [95 %-97 %] 96 % (11/16 0611) Weight:  [180 lb (81.647 kg)] 180 lb (81.647 kg) (11/15 2233)  Intake/Output from previous day:   Intake/Output from this shift:    . aspirin  324 mg Oral Once  . aspirin EC  81 mg Oral Daily  . atorvastatin  40 mg Oral q1800  . carvedilol  3.125 mg Oral BID WC  . ferrous sulfate  325 mg Oral Q breakfast  . metFORMIN  500 mg Oral BID WC  . multivitamin with minerals  1 tablet Oral Daily  . pantoprazole  20 mg Oral Daily  . sulfamethoxazole-trimethoprim  1 tablet Oral Q supper  . tamsulosin  0.4 mg Oral QPC supper  . Ticagrelor  90 mg Oral BID   . heparin 1,100 Units/hr (05/29/13 1610)    Physical Exam: The patient appears to be in no distress.  Head and neck exam reveals that the pupils are equal and reactive.  The extraocular movements are full.  There is no scleral icterus.  Mouth and pharynx are benign.  No lymphadenopathy.  No carotid bruits.  The jugular venous pressure is normal.  Thyroid is not enlarged or tender.  Chest is clear to percussion and auscultation.  No rales or rhonchi.  Expansion of the chest is symmetrical.  Heart reveals no abnormal lift or heave.  First and second heart sounds are normal.  There is no murmur gallop rub or click.  The abdomen is soft and nontender.  Bowel sounds are normoactive.  There is no hepatosplenomegaly or mass.  There are no abdominal bruits.  Extremities reveal no phlebitis or edema.  Pedal pulses are good.  There is no cyanosis or clubbing.  Neurologic exam is normal strength and no lateralizing weakness.  No sensory  deficits.  Integument reveals no rash  Lab Results:  Recent Labs  05/28/13 1750 05/29/13 0410  WBC 7.0 6.0  HGB 12.6* 13.1  PLT 157 140*    Recent Labs  05/28/13 1750 05/29/13 0410  NA 128* 134*  K 4.0 3.8  CL 93* 100  CO2 24 23  GLUCOSE 216* 185*  BUN 19 15  CREATININE 1.08 0.97    Recent Labs  05/29/13 0410 05/29/13 0820  TROPONINI <0.30 <0.30   Hepatic Function Panel  Recent Labs  05/28/13 1750  PROT 6.8  ALBUMIN 3.6  AST 26  ALT 27  ALKPHOS 71  BILITOT <0.1*    Recent Labs  05/29/13 0410  CHOL 190   No results found for this basename: PROTIME,  in the last 72 hours  Imaging: Dg Chest Portable 1 View  05/28/2013   CLINICAL DATA:  Chest pain  EXAM: PORTABLE CHEST - 1 VIEW  COMPARISON:  05/21/2013; 04/17/2013; 11/26/2012  FINDINGS: Grossly unchanged cardiac silhouette and mediastinal contours post median sternotomy and CABG. Minimal atherosclerotic plaque within the thoracic aorta. Mild cephalization of flow without frank evidence of edema, possibly artifactual/accentuated due to portable technique. Minimal perihilar atelectasis. No focal airspace opacities. No pleural effusion or pneumothorax. Grossly unchanged bones.  IMPRESSION: No definite acute cardiopulmonary disease  on this AP portable examination. Further evaluation with a PA and lateral chest radiograph may be obtained as clinically indicated.   Electronically Signed   By: Simonne Come M.D.   On: 05/28/2013 17:49    Cardiac Studies: Telemetry shows NSR Assessment/Plan:  - Precordial chest pain atypical  - Ischemic CM, EF 40% previously, now improved to 50% by echo Oct 2014  - DM   Plan: Steffanie Dunn this am. Possibly home tonight if myoview does not show any severe reversible ischemia (known occluded SVG to OM).   LOS: 1 day    Cassell Clement 05/29/2013, 9:26 AM

## 2013-05-29 NOTE — Progress Notes (Signed)
Utilization Review completed.  

## 2013-05-29 NOTE — Progress Notes (Signed)
Lexiscan Myoview  Lexiscan Injected. ECG:  No significant ST changes from baseline. Tolerated well.  He did complain of dyspnea.  Images pending. Signed,  Tereso Newcomer, PA-C   05/29/2013 11:57 AM

## 2013-05-30 ENCOUNTER — Encounter: Payer: Medicare Other | Admitting: Adult Health

## 2013-05-30 NOTE — Progress Notes (Signed)
HPI: Jeffrey [name] is an 79 year old patient of Dr. Tenny Craw, we are following for ongoing assessment and management of multivessel CAD, status post CABG with subsequent PCI. He has ischemic heart myopathy with an EF of 50% by recent echo. The patient was admitted to the hospital on 05/21/2013 with recurrent chest pain. He was ruled out for myocardial infarction. Coreg was added back to his medical regimen at 3.125 twice a day. The patient was re\re chest pain throughout hospitalization and was discharged the following day. He is here on hospital followup.  No Known Allergies  Current Outpatient Prescriptions  Medication Sig Dispense Refill  . albuterol (PROVENTIL HFA;VENTOLIN HFA) 108 (90 BASE) MCG/ACT inhaler Inhale 2 puffs into the lungs every 6 (six) hours as needed for wheezing or shortness of breath.      Marland Kitchen albuterol (PROVENTIL HFA;VENTOLIN HFA) 108 (90 BASE) MCG/ACT inhaler Inhale 2 puffs into the lungs every 6 (six) hours as needed for wheezing or shortness of breath.  1 Inhaler  0  . aspirin 325 MG tablet Take 325 mg by mouth daily.      Marland Kitchen atorvastatin (LIPITOR) 40 MG tablet Take 1 tablet (40 mg total) by mouth daily at 6 PM.  30 tablet  11  . bismuth subsalicylate (PEPTO BISMOL) 262 MG/15ML suspension Take 30 mLs by mouth every 6 (six) hours as needed.      . carvedilol (COREG) 3.125 MG tablet Take 1 tablet (3.125 mg total) by mouth 2 (two) times daily with a meal.  60 tablet  3  . ferrous sulfate 325 (65 FE) MG tablet Take 325 mg by mouth daily with breakfast.      . furosemide (LASIX) 20 MG tablet Take 20 mg by mouth daily as needed for fluid. Take 1 tablet as needed if 3 lb weight gain.      Marland Kitchen loperamide (IMODIUM) 2 MG capsule Take 2 mg by mouth as needed for diarrhea or loose stools.       . metFORMIN (GLUCOPHAGE) 500 MG tablet Take 500 mg by mouth 2 (two) times daily with a meal.      . Multiple Vitamin (MULTIVITAMIN WITH MINERALS) TABS Take 1 tablet by mouth daily.      .  nitroGLYCERIN (NITROSTAT) 0.4 MG SL tablet Place 1 tablet (0.4 mg total) under the tongue every 5 (five) minutes x 3 doses as needed for chest pain.  25 tablet  3  . pantoprazole (PROTONIX) 20 MG tablet Take 20 mg by mouth daily.       . potassium chloride SA (K-DUR,KLOR-CON) 20 MEQ tablet Take 20 mEq by mouth daily as needed (Takes only with Lasix).      Marland Kitchen sulfamethoxazole-trimethoprim (BACTRIM,SEPTRA) 400-80 MG per tablet Take 1 tablet by mouth daily with supper.       . tamsulosin (FLOMAX) 0.4 MG CAPS Take 0.4 mg by mouth daily after supper.       . Ticagrelor (BRILINTA) 90 MG TABS tablet Take 1 tablet (90 mg total) by mouth 2 (two) times daily.  60 tablet  6   No current facility-administered medications for this visit.    Past Medical History  Diagnosis Date  . Diabetes mellitus, type II   . Hyperlipidemia     Lipid profile in 02/2012:135, 227, 41, 49  . Coronary atherosclerosis of native coronary artery     a. CABG x 4 in 1989 (VG->OM1->OM2, VG->RCA, LIMA->LAD), b. 05/2010: DES to VG-OM1/OM2, DES to distal LCx. c. NSTEMI in 04/2011 -  TO distal LCX stent and VG->OM2. d. 02/2012 NSTEMI DES to VG-OM1/continuation to OM2 occluded. e. inferior STEMI s/p DES to SVG-RAMUS 06/2012. f. inferolat STEMI 09/2012 s/p DES to SVG-interm; g. Lex MV (11/14):  EF 35%, inf-lat scar with small peri-infarct ischemia  . Essential hypertension, benign   . Osteoarthritis   . History of stroke   . History of pneumonia   . Cervical vertebral fracture   . Chronic back pain   . Benign prostatic hypertrophy     History of urinary retention  . Peptic ulcer disease   . Gastroesophageal reflux disease   . Ischemic cardiomyopathy     LVEF 45-50%; Echo (04/2013):  mild LVH, EF 50%, Gr 1 DD, inf-lat HK, inf-septal and ant-lat HK, Tr MR, mild LAE    Past Surgical History  Procedure Laterality Date  . Tonsillectomy    . Coronary artery bypass graft      ROS: PHYSICAL EXAM There were no vitals taken for this  visit.  EKG:  ASSESSMENT AND PLAN

## 2013-06-13 ENCOUNTER — Encounter: Payer: Self-pay | Admitting: Adult Health

## 2013-06-13 ENCOUNTER — Ambulatory Visit (INDEPENDENT_AMBULATORY_CARE_PROVIDER_SITE_OTHER): Payer: Medicare Other | Admitting: Adult Health

## 2013-06-13 VITALS — BP 108/65 | HR 76 | Ht 70.0 in | Wt 183.0 lb

## 2013-06-13 DIAGNOSIS — I10 Essential (primary) hypertension: Secondary | ICD-10-CM

## 2013-06-13 DIAGNOSIS — I7090 Unspecified atherosclerosis: Secondary | ICD-10-CM

## 2013-06-13 DIAGNOSIS — I2510 Atherosclerotic heart disease of native coronary artery without angina pectoris: Secondary | ICD-10-CM

## 2013-06-13 NOTE — Assessment & Plan Note (Signed)
BP is well controlled currently. I have asked him to use NTG sparingly to avoid significant hypotension. No changes in his medications.

## 2013-06-13 NOTE — Progress Notes (Signed)
HPI: [name] is an 79 year old patient of [name], we are following for ongoing assessment and management of CAD, status post coronary artery bypass grafting, multiple subsequent PCI. He should also has history of ischemic cardiomyopathy with an EF of 40 to 50%. This was admitted to [facility] hospital with recurrent chest pain. He was observed overnight and was ruled out for myocardial infarction. A Lexiscan Myoview was completed and was found to be overall low risk and consistent with known history of occluded SVG to OM. The patient was continued on medical therapy.   He continues to have some intermittent pain. He is now having more problems with swallowing.  No Known Allergies  Current Outpatient Prescriptions  Medication Sig Dispense Refill  . albuterol (PROVENTIL HFA;VENTOLIN HFA) 108 (90 BASE) MCG/ACT inhaler Inhale 2 puffs into the lungs every 6 (six) hours as needed for wheezing or shortness of breath.      Marland Kitchen albuterol (PROVENTIL HFA;VENTOLIN HFA) 108 (90 BASE) MCG/ACT inhaler Inhale 2 puffs into the lungs every 6 (six) hours as needed for wheezing or shortness of breath.  1 Inhaler  0  . aspirin 325 MG tablet Take 325 mg by mouth daily.      Marland Kitchen atorvastatin (LIPITOR) 40 MG tablet Take 1 tablet (40 mg total) by mouth daily at 6 PM.  30 tablet  11  . bismuth subsalicylate (PEPTO BISMOL) 262 MG/15ML suspension Take 30 mLs by mouth every 6 (six) hours as needed.      . carvedilol (COREG) 3.125 MG tablet Take 1 tablet (3.125 mg total) by mouth 2 (two) times daily with a meal.  60 tablet  3  . ferrous sulfate 325 (65 FE) MG tablet Take 325 mg by mouth daily with breakfast.      . furosemide (LASIX) 20 MG tablet Take 20 mg by mouth daily as needed for fluid. Take 1 tablet as needed if 3 lb weight gain.      Marland Kitchen loperamide (IMODIUM) 2 MG capsule Take 2 mg by mouth as needed for diarrhea or loose stools.       . metFORMIN (GLUCOPHAGE) 500 MG tablet Take 500 mg by mouth 2 (two) times daily  with a meal.      . Multiple Vitamin (MULTIVITAMIN WITH MINERALS) TABS Take 1 tablet by mouth daily.      . nitroGLYCERIN (NITROSTAT) 0.4 MG SL tablet Place 1 tablet (0.4 mg total) under the tongue every 5 (five) minutes x 3 doses as needed for chest pain.  25 tablet  3  . pantoprazole (PROTONIX) 20 MG tablet Take 20 mg by mouth daily.       . potassium chloride SA (K-DUR,KLOR-CON) 20 MEQ tablet Take 20 mEq by mouth daily as needed (Takes only with Lasix).      Marland Kitchen sulfamethoxazole-trimethoprim (BACTRIM,SEPTRA) 400-80 MG per tablet Take 1 tablet by mouth daily with supper.       . tamsulosin (FLOMAX) 0.4 MG CAPS Take 0.4 mg by mouth daily after supper.       . Ticagrelor (BRILINTA) 90 MG TABS tablet Take 1 tablet (90 mg total) by mouth 2 (two) times daily.  60 tablet  6   No current facility-administered medications for this visit.    Past Medical History  Diagnosis Date  . Diabetes mellitus, type II   . Hyperlipidemia     Lipid profile in 02/2012:135, 227, 41, 49  . Coronary atherosclerosis of native coronary artery     a. CABG x  4 in 1989 (VG->OM1->OM2, VG->RCA, LIMA->LAD), b. 05/2010: DES to VG-OM1/OM2, DES to distal LCx. c. NSTEMI in 04/2011 - TO distal LCX stent and VG->OM2. d. 02/2012 NSTEMI DES to VG-OM1/continuation to OM2 occluded. e. inferior STEMI s/p DES to SVG-RAMUS 06/2012. f. inferolat STEMI 09/2012 s/p DES to SVG-interm; g. Lex MV (11/14):  EF 35%, inf-lat scar with small peri-infarct ischemia  . Essential hypertension, benign   . Osteoarthritis   . History of stroke   . History of pneumonia   . Cervical vertebral fracture   . Chronic back pain   . Benign prostatic hypertrophy     History of urinary retention  . Peptic ulcer disease   . Gastroesophageal reflux disease   . Ischemic cardiomyopathy     LVEF 45-50%; Echo (04/2013):  mild LVH, EF 50%, Gr 1 DD, inf-lat HK, inf-septal and ant-lat HK, Tr MR, mild LAE    Past Surgical History  Procedure Laterality Date  .  Tonsillectomy    . Coronary artery bypass graft      ROS: Review of systems complete and found to be negative unless listed above  PHYSICAL EXAM BP 108/65  Pulse 76  Ht 5\' 10"  (1.778 m)  Wt 183 lb (83.008 kg)  BMI 26.26 kg/m2 General: Well developed, well nourished, in no acute distress, no teeth. Head: Eyes PERRLA, No xanthomas.   Normal cephalic and atramatic  Lungs: Clear bilaterally to auscultation and percussion. Heart: HRRR S1 S2, without MRG.  Pulses are 2+ & equal.            No carotid bruit. No JVD.  No abdominal bruits. No femoral bruits. Abdomen: Bowel sounds are positive, abdomen soft and non-tender without masses or                  Hernia's noted. Msk:  Back normal, normal gait. Normal strength and tone for age. Extremities: No clubbing, cyanosis or edema.  DP +1 Neuro: Alert and oriented X 3. Psych:  Good affect, responds appropriately    ASSESSMENT AND PLAN

## 2013-06-13 NOTE — Patient Instructions (Signed)
Your physician recommends that you schedule a follow-up appointment in: 6 months with Dr Koneswaran You will receive a reminder letter two months in advance reminding you to call and schedule your appointment. If you don't receive this letter, please contact our office.  Your physician recommends that you continue on your current medications as directed. Please refer to the Current Medication list given to you today.   

## 2013-06-13 NOTE — Progress Notes (Deleted)
Name: Jeffrey [name]    DOB: 02-Jul-1929  Age: 79 y.o.  MR#: [id number]       PCP:  [name], MD      Insurance: Payor: MEDICARE / Plan: MEDICARE PART A AND B / Product Type: *No Product type* /   CC:    Chief Complaint  Patient presents with  . Chest Pain  . Hypertension    VS Filed Vitals:   06/13/13 1448  BP: 108/65  Pulse: 76  Height: 5\' 10"  (1.778 m)  Weight: 183 lb (83.008 kg)    Weights Current Weight  06/13/13 183 lb (83.008 kg)  05/28/13 180 lb (81.647 kg)  05/22/13 180 lb 0.2 oz (81.654 kg)    Blood Pressure  BP Readings from Last 3 Encounters:  06/13/13 108/65  05/29/13 124/58  05/22/13 124/65     Admit date:  (Not on file) Last encounter with RMR:  05/23/2013   Allergy Review of patient's allergies indicates no known allergies.  Current Outpatient Prescriptions  Medication Sig Dispense Refill  . albuterol (PROVENTIL HFA;VENTOLIN HFA) 108 (90 BASE) MCG/ACT inhaler Inhale 2 puffs into the lungs every 6 (six) hours as needed for wheezing or shortness of breath.      Marland Kitchen albuterol (PROVENTIL HFA;VENTOLIN HFA) 108 (90 BASE) MCG/ACT inhaler Inhale 2 puffs into the lungs every 6 (six) hours as needed for wheezing or shortness of breath.  1 Inhaler  0  . aspirin 325 MG tablet Take 325 mg by mouth daily.      Marland Kitchen atorvastatin (LIPITOR) 40 MG tablet Take 1 tablet (40 mg total) by mouth daily at 6 PM.  30 tablet  11  . bismuth subsalicylate (PEPTO BISMOL) 262 MG/15ML suspension Take 30 mLs by mouth every 6 (six) hours as needed.      . carvedilol (COREG) 3.125 MG tablet Take 1 tablet (3.125 mg total) by mouth 2 (two) times daily with a meal.  60 tablet  3  . ferrous sulfate 325 (65 FE) MG tablet Take 325 mg by mouth daily with breakfast.      . furosemide (LASIX) 20 MG tablet Take 20 mg by mouth daily as needed for fluid. Take 1 tablet as needed if 3 lb weight gain.      Marland Kitchen loperamide (IMODIUM) 2 MG capsule Take 2 mg by mouth as needed for diarrhea or loose stools.        . metFORMIN (GLUCOPHAGE) 500 MG tablet Take 500 mg by mouth 2 (two) times daily with a meal.      . Multiple Vitamin (MULTIVITAMIN WITH MINERALS) TABS Take 1 tablet by mouth daily.      . nitroGLYCERIN (NITROSTAT) 0.4 MG SL tablet Place 1 tablet (0.4 mg total) under the tongue every 5 (five) minutes x 3 doses as needed for chest pain.  25 tablet  3  . pantoprazole (PROTONIX) 20 MG tablet Take 20 mg by mouth daily.       . potassium chloride SA (K-DUR,KLOR-CON) 20 MEQ tablet Take 20 mEq by mouth daily as needed (Takes only with Lasix).      Marland Kitchen sulfamethoxazole-trimethoprim (BACTRIM,SEPTRA) 400-80 MG per tablet Take 1 tablet by mouth daily with supper.       . tamsulosin (FLOMAX) 0.4 MG CAPS Take 0.4 mg by mouth daily after supper.       . Ticagrelor (BRILINTA) 90 MG TABS tablet Take 1 tablet (90 mg total) by mouth 2 (two) times daily.  60 tablet  6  No current facility-administered medications for this visit.    Discontinued Meds:   There are no discontinued medications.  Patient Active Problem List   Diagnosis Date Noted  . Precordial chest pain 05/29/2013  . Coronary atherosclerosis of native coronary artery 05/29/2013  . Noncompliance 11/22/2012  . Anemia, normocytic normochromic   . Cardiomyopathy, ischemic 06/21/2012  . Cerebrovascular disease 06/08/2012  . Diabetes mellitus, type II   . Hypertension   . Arteriosclerotic cardiovascular disease (ASCVD) 10/25/2011  . Hyperlipidemia 10/24/2011  . BPH (benign prostatic hyperplasia) 05/07/2011    LABS    Component Value Date/Time   NA 134* 05/29/2013 0410   NA 128* 05/28/2013 1750   NA 136 05/22/2013 0445   K 3.8 05/29/2013 0410   K 4.0 05/28/2013 1750   K 4.0 05/22/2013 0445   CL 100 05/29/2013 0410   CL 93* 05/28/2013 1750   CL 100 05/22/2013 0445   CO2 23 05/29/2013 0410   CO2 24 05/28/2013 1750   CO2 25 05/22/2013 0445   GLUCOSE 185* 05/29/2013 0410   GLUCOSE 216* 05/28/2013 1750   GLUCOSE 198* 05/22/2013 0445    BUN 15 05/29/2013 0410   BUN 19 05/28/2013 1750   BUN 16 05/22/2013 0445   CREATININE 0.97 05/29/2013 0410   CREATININE 1.08 05/28/2013 1750   CREATININE 0.96 05/22/2013 0445   CREATININE 1.19 04/27/2013 1140   CREATININE 0.98 12/04/2011 1208   CALCIUM 9.2 05/29/2013 0410   CALCIUM 9.6 05/28/2013 1750   CALCIUM 8.9 05/22/2013 0445   GFRNONAA 75* 05/29/2013 0410   GFRNONAA 62* 05/28/2013 1750   GFRNONAA 75* 05/22/2013 0445   GFRAA 87* 05/29/2013 0410   GFRAA 72* 05/28/2013 1750   GFRAA 87* 05/22/2013 0445   CMP     Component Value Date/Time   NA 134* 05/29/2013 0410   K 3.8 05/29/2013 0410   CL 100 05/29/2013 0410   CO2 23 05/29/2013 0410   GLUCOSE 185* 05/29/2013 0410   BUN 15 05/29/2013 0410   CREATININE 0.97 05/29/2013 0410   CREATININE 1.19 04/27/2013 1140   CALCIUM 9.2 05/29/2013 0410   PROT 6.8 05/28/2013 1750   ALBUMIN 3.6 05/28/2013 1750   AST 26 05/28/2013 1750   ALT 27 05/28/2013 1750   ALKPHOS 71 05/28/2013 1750   BILITOT <0.1* 05/28/2013 1750   GFRNONAA 75* 05/29/2013 0410   GFRAA 87* 05/29/2013 0410       Component Value Date/Time   WBC 6.0 05/29/2013 0410   WBC 7.0 05/28/2013 1750   WBC 6.5 05/21/2013 1047   HGB 13.1 05/29/2013 0410   HGB 12.6* 05/28/2013 1750   HGB 13.1 05/21/2013 1047   HCT 39.4 05/29/2013 0410   HCT 37.5* 05/28/2013 1750   HCT 39.1 05/21/2013 1047   MCV 92.9 05/29/2013 0410   MCV 92.4 05/28/2013 1750   MCV 92.4 05/21/2013 1047    Lipid Panel     Component Value Date/Time   CHOL 190 05/29/2013 0410   TRIG 335* 05/29/2013 0410   HDL 33* 05/29/2013 0410   CHOLHDL 5.8 05/29/2013 0410   VLDL 67* 05/29/2013 0410   LDLCALC 90 05/29/2013 0410    ABG    Component Value Date/Time   TCO2 23 10/06/2012 1545     Lab Results  Component Value Date   TSH 3.483 10/16/2012   BNP (last 3 results)  Recent Labs  07/29/12 1340 07/29/12 1855 04/27/13 1140  PROBNP 863.6* 845.2* 1023.00*   Cardiac Panel (last 3 results) No results found  for this basename: CKTOTAL, CKMB, TROPONINI, RELINDX,  in the last 72 hours  Iron/TIBC/Ferritin No results found for this basename: iron, tibc, ferritin     EKG Orders placed during the hospital encounter of 05/28/13  . EKG 12-LEAD  . EKG 12-LEAD  . ED EKG  . ED EKG  . EKG 12-LEAD  . EKG 12-LEAD  . EKG 12-LEAD  . EXERCISE TOLERANCE TEST  . EXERCISE TOLERANCE TEST  . EKG     Prior Assessment and Plan Problem List as of 06/13/2013     Cardiovascular and Mediastinum   Arteriosclerotic cardiovascular disease (ASCVD)   Last Assessment & Plan   05/06/2013 Office Visit Written 05/06/2013  2:03 PM by Jodelle Gross, NP     He is without cardiac complaint with the exception of generalized fatigue. He will continue on Brilinta and ASA daily. Ongoing medication management.    Hypertension   Last Assessment & Plan   05/06/2013 Office Visit Written 05/06/2013  2:04 PM by Jodelle Gross, NP     Blood pressure is low normal. He is not symptomatic. Will continue him on current medical regimen.  Follow up in 6 months.    Cerebrovascular disease   Last Assessment & Plan   09/09/2012 Office Visit Written 09/09/2012  3:42 PM by Kathlen Brunswick, MD     Recent carotid ultrasound negative for obstruction. Optimal control of cardiovascular risk factors appears effective in preventing progression of disease and will be continued.    Cardiomyopathy, ischemic   Last Assessment & Plan   05/06/2013 Office Visit Written 05/06/2013  2:05 PM by Jodelle Gross, NP     Improved EF per echo this month to 50%. No further evidence of fluid retention. He is breathing better without edema and is down 5 lbs. I have asked him to weigh himself daily. If he gain 3 lbs in 24 hours. He is to take one dose of lasix with potasium that day. He is not to take it on a daily basis. He is reminded of low sodium diet. We will see him in 6 months.    Coronary atherosclerosis of native coronary artery     Endocrine    Diabetes mellitus, type II   Last Assessment & Plan   04/27/2013 Office Visit Written 04/27/2013  1:54 PM by Jodelle Gross, NP     Followed by PCP      Genitourinary   BPH (benign prostatic hyperplasia)   Last Assessment & Plan   09/09/2012 Office Visit Edited 09/11/2012 11:21 PM by Kathlen Brunswick, MD     Patient advised not to increase his dose of Flomax or other medications without specific instructions and will resume his usual daily dose.  He is scheduled to return to see Dr. Jerre Simon in the near future for catheter removal.      Other   Hyperlipidemia   Last Assessment & Plan   05/06/2013 Office Visit Written 05/06/2013  2:06 PM by Jodelle Gross, NP     Will need to have follow up lipids and LFT's in 3 months.    Anemia, normocytic normochromic   Last Assessment & Plan   04/27/2013 Office Visit Written 04/27/2013  1:55 PM by Jodelle Gross, NP     Stable. Most recent lab work last week. Continues on iron replacement.    Noncompliance   Last Assessment & Plan   04/27/2013 Office Visit Written 04/27/2013  1:54 PM by Jodelle Gross, NP  He is not adhering to low sodium diet. I have reinforced this to him. He is to weigh daily.    Precordial chest pain       Imaging: Dg Chest 2 View  05/21/2013   CLINICAL DATA:  Chest pain.  EXAM: CHEST  2 VIEW  COMPARISON:  04/17/2013 and 05/17/2012  FINDINGS: Sternotomy wires are unchanged. Lungs are adequately inflated without consolidation or effusion. Cardiomediastinal silhouette is within normal. There is calcified plaque over the thoracoabdominal aorta. There spondylosis of the spine.  IMPRESSION: No acute cardiopulmonary disease.   Electronically Signed   By: Elberta Fortis M.D.   On: 05/21/2013 11:22   Nm Myocar Multi W/spect W/wall Motion / Ef  05/29/2013   CLINICAL DATA:  Chest pain  EXAM: MYOCARDIAL IMAGING WITH SPECT (REST AND PHARMACOLOGIC-STRESS)  GATED LEFT VENTRICULAR WALL MOTION STUDY  LEFT VENTRICULAR  EJECTION FRACTION  TECHNIQUE: Standard myocardial SPECT imaging was performed after resting intravenous injection of 10 mCi Tc-5m sestamibi. Subsequently, intravenous infusion of Lexiscan was performed under the supervision of the Cardiology staff. At peak effect of the drug, 30 mCi Tc-74m sestamibi was injected intravenously and standard myocardial SPECT imaging was performed. Quantitative gated imaging was also performed to evaluate left ventricular wall motion, and estimate left ventricular ejection fraction.  COMPARISON:  None.  FINDINGS: Fixed defect in the inferior lateral walls noted compatible with prior infarct. The inferolateral defect is slightly more pronounced on the stress views than the rest. This is suspicious for a small amount of peri-infarct ischemia.  Wall motion:  Global hypokinesis noted with inferior dyskinesis.  Ejection fraction: Calculated QGS ejection fraction is low measuring 35%.  IMPRESSION: Inferior lateral fixed defect with findings suspicious for a small amount of peri-infarct ischemia   Electronically Signed   By: Ruel Favors M.D.   On: 05/29/2013 13:10   Dg Chest Portable 1 View  05/28/2013   CLINICAL DATA:  Chest pain  EXAM: PORTABLE CHEST - 1 VIEW  COMPARISON:  05/21/2013; 04/17/2013; 11/26/2012  FINDINGS: Grossly unchanged cardiac silhouette and mediastinal contours post median sternotomy and CABG. Minimal atherosclerotic plaque within the thoracic aorta. Mild cephalization of flow without frank evidence of edema, possibly artifactual/accentuated due to portable technique. Minimal perihilar atelectasis. No focal airspace opacities. No pleural effusion or pneumothorax. Grossly unchanged bones.  IMPRESSION: No definite acute cardiopulmonary disease on this AP portable examination. Further evaluation with a PA and lateral chest radiograph may be obtained as clinically indicated.   Electronically Signed   By: Simonne Come M.D.   On: 05/28/2013 17:49

## 2013-06-13 NOTE — Assessment & Plan Note (Signed)
He is doing well without further cardiac complaint. He was ruled out for cardiac etiology for recurrent chest pain on hospitalization in Nov of 2014. He will be continued medical regimen and risk management. I have suggested that he be seen by GI has he has a history of esophageal stricture with dilation years ago with Dr.Fields. We will see him in 6 months.

## 2013-06-16 ENCOUNTER — Encounter (HOSPITAL_COMMUNITY): Payer: Self-pay | Admitting: Emergency Medicine

## 2013-06-16 ENCOUNTER — Observation Stay (HOSPITAL_COMMUNITY)
Admission: EM | Admit: 2013-06-16 | Discharge: 2013-06-17 | Disposition: A | Discharge status: Home / Self Care | Payer: Medicare Other | Attending: Internal Medicine | Admitting: Internal Medicine

## 2013-06-16 ENCOUNTER — Emergency Department (HOSPITAL_COMMUNITY): Payer: Medicare Other

## 2013-06-16 DIAGNOSIS — I2510 Atherosclerotic heart disease of native coronary artery without angina pectoris: Secondary | ICD-10-CM | POA: Diagnosis present

## 2013-06-16 DIAGNOSIS — N400 Enlarged prostate without lower urinary tract symptoms: Secondary | ICD-10-CM

## 2013-06-16 DIAGNOSIS — I10 Essential (primary) hypertension: Secondary | ICD-10-CM | POA: Diagnosis present

## 2013-06-16 DIAGNOSIS — D649 Anemia, unspecified: Secondary | ICD-10-CM

## 2013-06-16 DIAGNOSIS — I2589 Other forms of chronic ischemic heart disease: Secondary | ICD-10-CM

## 2013-06-16 DIAGNOSIS — R072 Precordial pain: Secondary | ICD-10-CM

## 2013-06-16 DIAGNOSIS — E119 Type 2 diabetes mellitus without complications: Secondary | ICD-10-CM

## 2013-06-16 DIAGNOSIS — I679 Cerebrovascular disease, unspecified: Secondary | ICD-10-CM

## 2013-06-16 DIAGNOSIS — E785 Hyperlipidemia, unspecified: Secondary | ICD-10-CM | POA: Diagnosis present

## 2013-06-16 DIAGNOSIS — I255 Ischemic cardiomyopathy: Secondary | ICD-10-CM

## 2013-06-16 DIAGNOSIS — R079 Chest pain, unspecified: Principal | ICD-10-CM

## 2013-06-16 DIAGNOSIS — Z9119 Patient's noncompliance with other medical treatment and regimen: Secondary | ICD-10-CM

## 2013-06-16 LAB — CBC WITH DIFFERENTIAL/PLATELET
Basophils Absolute: 0 10*3/uL (ref 0.0–0.1)
Basophils Relative: 0 % (ref 0–1)
Eosinophils Absolute: 0.4 10*3/uL (ref 0.0–0.7)
Eosinophils Relative: 5 % (ref 0–5)
Hemoglobin: 12.7 g/dL — ABNORMAL LOW (ref 13.0–17.0)
MCH: 31.1 pg (ref 26.0–34.0)
MCHC: 33.5 g/dL (ref 30.0–36.0)
MCV: 92.9 fL (ref 78.0–100.0)
Monocytes Relative: 7 % (ref 3–12)
Neutro Abs: 6 10*3/uL (ref 1.7–7.7)
Neutrophils Relative %: 74 % (ref 43–77)
Platelets: 151 10*3/uL (ref 150–400)
RDW: 13.1 % (ref 11.5–15.5)

## 2013-06-16 LAB — BASIC METABOLIC PANEL
BUN: 17 mg/dL (ref 6–23)
GFR calc Af Amer: 68 mL/min — ABNORMAL LOW (ref >90)
GFR calc non Af Amer: 59 mL/min — ABNORMAL LOW (ref >90)
Glucose, Bld: 145 mg/dL — ABNORMAL HIGH (ref 70–99)
Potassium: 4.5 mEq/L (ref 3.5–5.1)
Sodium: 137 mEq/L (ref 135–145)

## 2013-06-16 LAB — GLUCOSE, CAPILLARY

## 2013-06-16 MED ORDER — ONDANSETRON HCL 4 MG PO TABS: 4.0000 mg | ORAL_TABLET | Freq: Four times a day (QID) | ORAL | Status: DC | PRN

## 2013-06-16 MED ORDER — ENOXAPARIN SODIUM 40 MG/0.4ML ~~LOC~~ SOLN
40.0000 mg | SUBCUTANEOUS | Status: DC
  Administered 2013-06-16: 40 mg via SUBCUTANEOUS
  Filled 2013-06-16: qty 0.4

## 2013-06-16 MED ORDER — RANOLAZINE ER 500 MG PO TB12
500.0000 mg | ORAL_TABLET | Freq: Two times a day (BID) | ORAL | Status: DC
  Administered 2013-06-16 – 2013-06-17 (×2): 500 mg via ORAL
  Filled 2013-06-16 (×8): qty 1

## 2013-06-16 MED ORDER — TAMSULOSIN HCL 0.4 MG PO CAPS
0.4000 mg | ORAL_CAPSULE | Freq: Every day | ORAL | Status: DC
  Administered 2013-06-16: 0.4 mg via ORAL
  Filled 2013-06-16: qty 1

## 2013-06-16 MED ORDER — OXYCODONE HCL 5 MG PO TABS: 5.0000 mg | ORAL_TABLET | ORAL | Status: DC | PRN

## 2013-06-16 MED ORDER — ACETAMINOPHEN 325 MG PO TABS: 650.0000 mg | ORAL_TABLET | Freq: Four times a day (QID) | ORAL | Status: DC | PRN

## 2013-06-16 MED ORDER — LOPERAMIDE HCL 2 MG PO CAPS: 2.0000 mg | ORAL_CAPSULE | ORAL | Status: DC | PRN

## 2013-06-16 MED ORDER — POTASSIUM CHLORIDE CRYS ER 20 MEQ PO TBCR: 20.0000 meq | EXTENDED_RELEASE_TABLET | Freq: Every day | ORAL | Status: DC | PRN

## 2013-06-16 MED ORDER — FUROSEMIDE 20 MG PO TABS: 20.0000 mg | ORAL_TABLET | Freq: Every day | ORAL | Status: DC | PRN

## 2013-06-16 MED ORDER — CARVEDILOL 3.125 MG PO TABS
3.1250 mg | ORAL_TABLET | Freq: Two times a day (BID) | ORAL | Status: DC
  Administered 2013-06-16 – 2013-06-17 (×2): 3.125 mg via ORAL
  Filled 2013-06-16 (×2): qty 1

## 2013-06-16 MED ORDER — TICAGRELOR 90 MG PO TABS
90.0000 mg | ORAL_TABLET | Freq: Two times a day (BID) | ORAL | Status: DC
  Administered 2013-06-16 – 2013-06-17 (×2): 90 mg via ORAL
  Filled 2013-06-16 (×8): qty 1

## 2013-06-16 MED ORDER — SODIUM CHLORIDE 0.9 % IJ SOLN
3.0000 mL | Freq: Two times a day (BID) | INTRAMUSCULAR | Status: DC
  Administered 2013-06-16: 3 mL via INTRAVENOUS

## 2013-06-16 MED ORDER — SODIUM CHLORIDE 0.9 % IJ SOLN
3.0000 mL | Freq: Two times a day (BID) | INTRAMUSCULAR | Status: DC
  Administered 2013-06-16 – 2013-06-17 (×3): 3 mL via INTRAVENOUS

## 2013-06-16 MED ORDER — ATORVASTATIN CALCIUM 40 MG PO TABS
40.0000 mg | ORAL_TABLET | Freq: Every day | ORAL | Status: DC
  Administered 2013-06-16: 40 mg via ORAL
  Filled 2013-06-16: qty 1

## 2013-06-16 MED ORDER — NITROGLYCERIN 0.4 MG SL SUBL: 0.4000 mg | SUBLINGUAL_TABLET | SUBLINGUAL | Status: DC | PRN

## 2013-06-16 MED ORDER — ACETAMINOPHEN 650 MG RE SUPP: 650.0000 mg | Freq: Four times a day (QID) | RECTAL | Status: DC | PRN

## 2013-06-16 MED ORDER — ADULT MULTIVITAMIN W/MINERALS CH
1.0000 | ORAL_TABLET | Freq: Every day | ORAL | Status: DC
  Administered 2013-06-16 – 2013-06-17 (×2): 1 via ORAL
  Filled 2013-06-16 (×2): qty 1

## 2013-06-16 MED ORDER — ASPIRIN 325 MG PO TABS
325.0000 mg | ORAL_TABLET | Freq: Every day | ORAL | Status: DC
  Administered 2013-06-16 – 2013-06-17 (×2): 325 mg via ORAL
  Filled 2013-06-16 (×2): qty 1

## 2013-06-16 MED ORDER — PANTOPRAZOLE SODIUM 40 MG PO TBEC
40.0000 mg | DELAYED_RELEASE_TABLET | Freq: Every day | ORAL | Status: DC
  Administered 2013-06-16 – 2013-06-17 (×2): 40 mg via ORAL
  Filled 2013-06-16 (×2): qty 1

## 2013-06-16 MED ORDER — ALBUTEROL SULFATE HFA 108 (90 BASE) MCG/ACT IN AERS: 2.0000 | INHALATION_SPRAY | Freq: Four times a day (QID) | RESPIRATORY_TRACT | Status: DC | PRN

## 2013-06-16 MED ORDER — INSULIN ASPART 100 UNIT/ML ~~LOC~~ SOLN
0.0000 [IU] | Freq: Three times a day (TID) | SUBCUTANEOUS | Status: DC
  Administered 2013-06-16: 3 [IU] via SUBCUTANEOUS
  Administered 2013-06-17: 2 [IU] via SUBCUTANEOUS

## 2013-06-16 MED ORDER — MORPHINE SULFATE 2 MG/ML IJ SOLN: 1.0000 mg | INTRAMUSCULAR | Status: DC | PRN

## 2013-06-16 MED ORDER — SODIUM CHLORIDE 0.9 % IJ SOLN: 3.0000 mL | INTRAMUSCULAR | Status: DC | PRN

## 2013-06-16 MED ORDER — ALUM & MAG HYDROXIDE-SIMETH 200-200-20 MG/5ML PO SUSP: 30.0000 mL | Freq: Four times a day (QID) | ORAL | Status: DC | PRN

## 2013-06-16 MED ORDER — ONDANSETRON HCL 4 MG/2ML IJ SOLN: 4.0000 mg | Freq: Four times a day (QID) | INTRAMUSCULAR | Status: DC | PRN

## 2013-06-16 MED ORDER — FERROUS SULFATE 325 (65 FE) MG PO TABS
325.0000 mg | ORAL_TABLET | Freq: Every day | ORAL | Status: DC
  Administered 2013-06-17: 325 mg via ORAL
  Filled 2013-06-16: qty 1

## 2013-06-16 MED ORDER — ALBUTEROL SULFATE (5 MG/ML) 0.5% IN NEBU: 2.5000 mg | INHALATION_SOLUTION | RESPIRATORY_TRACT | Status: DC | PRN

## 2013-06-16 MED ORDER — SODIUM CHLORIDE 0.9 % IV SOLN: 250.0000 mL | INTRAVENOUS | Status: DC | PRN

## 2013-06-16 NOTE — H&P (Signed)
PATIENT DETAILS Name: Jeffrey [name] Age: 79 y.o. Sex: male Date of Birth: 1928-10-07 Admit Date: 06/16/2013 AVW:UJWJX,BJYNWGNF J., MD   CHIEF COMPLAINT:  Chest pain  HPI: Jeffrey [name] is a 79 y.o. male with a Past Medical History of coronary artery disease status post CABG and multiple PCI's thereafter, diabetes, hypertension, dyslipidemia who presents today with the above noted complaint. Per patient, early this morning after he had a big breakfast he started having retrosternal discomfort. He initially thought it was reflux, however the pain quickly worsened and was different from his usual reflux pain. He then proceeded to the emergency room, on the way he took a sublingual nitroglycerin. His pain improved, he then left from the emergency waiting room without being seen by a physician. He again had recurrent chest pain, following which he then came back to the emergency room, I was subsequently asked to admit this patient for further evaluation and treatment. The patient, pain is dull, it lasted a few minutes this morning-but not able to exactly quantify how long. There is no associated nausea, vomiting, shortness of breath, diaphoresis or palpitations. There was no radiation of the pain. Patient has had extensive evaluation in the past including a left heart catheterization in April 2014, and a nuclear stress test that was negative for ischemia just done last month. He seems to be somewhat anxious. In any event, patient is now being admitted for further evaluation and treatment.  ALLERGIES:  No Known Allergies  PAST MEDICAL HISTORY: Past Medical History  Diagnosis Date  . Diabetes mellitus, type II   . Hyperlipidemia     Lipid profile in 02/2012:135, 227, 41, 49  . Coronary atherosclerosis of native coronary artery     a. CABG x 4 in 1989 (VG->OM1->OM2, VG->RCA, LIMA->LAD), b. 05/2010: DES to VG-OM1/OM2, DES to distal LCx. c. NSTEMI in 04/2011 - TO distal LCX stent and  VG->OM2. d. 02/2012 NSTEMI DES to VG-OM1/continuation to OM2 occluded. e. inferior STEMI s/p DES to SVG-RAMUS 06/2012. f. inferolat STEMI 09/2012 s/p DES to SVG-interm; g. Lex MV (11/14):  EF 35%, inf-lat scar with small peri-infarct ischemia  . Essential hypertension, benign   . Osteoarthritis   . History of stroke   . History of pneumonia   . Cervical vertebral fracture   . Chronic back pain   . Benign prostatic hypertrophy     History of urinary retention  . Peptic ulcer disease   . Gastroesophageal reflux disease   . Ischemic cardiomyopathy     LVEF 45-50%; Echo (04/2013):  mild LVH, EF 50%, Gr 1 DD, inf-lat HK, inf-septal and ant-lat HK, Tr MR, mild LAE    PAST SURGICAL HISTORY: Past Surgical History  Procedure Laterality Date  . Tonsillectomy    . Coronary artery bypass graft      MEDICATIONS AT HOME: Prior to Admission medications   Medication Sig Start Date End Date Taking? Authorizing Provider  albuterol (PROVENTIL HFA;VENTOLIN HFA) 108 (90 BASE) MCG/ACT inhaler Inhale 2 puffs into the lungs every 6 (six) hours as needed for wheezing or shortness of breath.   Yes Historical Provider, MD  albuterol (PROVENTIL HFA;VENTOLIN HFA) 108 (90 BASE) MCG/ACT inhaler Inhale 2 puffs into the lungs every 6 (six) hours as needed for wheezing or shortness of breath. 05/29/13  Yes Beatrice Lecher, PA-C  aspirin 325 MG tablet Take 325 mg by mouth daily.   Yes Historical Provider, MD  atorvastatin (LIPITOR) 40 MG tablet Take 1 tablet (40 mg total) by  mouth daily at 6 PM. 05/29/13  Yes Scott T Alben Spittle, PA-C  carvedilol (COREG) 3.125 MG tablet Take 1 tablet (3.125 mg total) by mouth 2 (two) times daily with a meal. 05/22/13  Yes Brooke O Edmisten, PA-C  ferrous sulfate 325 (65 FE) MG tablet Take 325 mg by mouth daily with breakfast.   Yes Historical Provider, MD  furosemide (LASIX) 20 MG tablet Take 20 mg by mouth daily as needed for fluid. Take 1 tablet as needed if 3 lb weight gain. 05/06/13  Yes  Jodelle Gross, NP  loperamide (IMODIUM) 2 MG capsule Take 2 mg by mouth as needed for diarrhea or loose stools.  02/28/13  Yes Historical Provider, MD  metFORMIN (GLUCOPHAGE) 500 MG tablet Take 500 mg by mouth 2 (two) times daily with a meal.   Yes Historical Provider, MD  Multiple Vitamin (MULTIVITAMIN WITH MINERALS) TABS Take 1 tablet by mouth daily.   Yes Historical Provider, MD  nitroGLYCERIN (NITROSTAT) 0.4 MG SL tablet Place 1 tablet (0.4 mg total) under the tongue every 5 (five) minutes x 3 doses as needed for chest pain. 11/11/12 11/11/13 Yes Rhonda G Barrett, PA-C  potassium chloride SA (K-DUR,KLOR-CON) 20 MEQ tablet Take 20 mEq by mouth daily as needed (Takes only with Lasix). 04/28/13  Yes Jodelle Gross, NP  sulfamethoxazole-trimethoprim (BACTRIM,SEPTRA) 400-80 MG per tablet Take 1 tablet by mouth daily with supper.  05/02/13  Yes Historical Provider, MD  tamsulosin (FLOMAX) 0.4 MG CAPS Take 0.4 mg by mouth daily after supper.  01/27/13  Yes Historical Provider, MD  Ticagrelor (BRILINTA) 90 MG TABS tablet Take 1 tablet (90 mg total) by mouth 2 (two) times daily. 06/21/12  Yes Roger A Arguello, PA-C  pantoprazole (PROTONIX) 20 MG tablet Take 20 mg by mouth daily.  01/27/13   Historical Provider, MD    FAMILY HISTORY: Family History  Problem Relation Age of Onset  . Early death      Parents died young  . Appendicitis Mother     Pt was 38 year old  . Heart attack Father 12    SOCIAL HISTORY:  reports that he quit smoking about 51 years ago. His smoking use included Cigarettes. He has a 20 pack-year smoking history. His smokeless tobacco use includes Chew. He reports that he does not drink alcohol or use illicit drugs.  REVIEW OF SYSTEMS:  Constitutional:   No  weight loss, night sweats,  Fevers, chills, fatigue.  HEENT:    No headaches, Difficulty swallowing,Tooth/dental problems,Sore throat,  No sneezing, itching, ear ache, nasal congestion, post nasal drip,    Cardio-vascular: No  Orthopnea, PND, swelling in lower extremities, anasarca, dizziness, palpitations  GI:  No heartburn, indigestion, abdominal pain, nausea, vomiting, diarrhea, change in bowel habits, loss of appetite  Resp: No shortness of breath with exertion or at rest.  No excess mucus, no productive cough, No non-productive cough,  No coughing up of blood.No change in color of mucus.No wheezing.No chest wall deformity  Skin:  no rash or lesions.  GU:  no dysuria, change in color of urine, no urgency or frequency.  No flank pain.  Musculoskeletal: No joint pain or swelling.  No decreased range of motion.  No back pain.  Psych: No change in mood or affect. No depression or anxiety.  No memory loss.   PHYSICAL EXAM: Blood pressure 112/62, pulse 77, temperature 98.5 F (36.9 C), temperature source Oral, resp. rate 16, weight 83.008 kg (183 lb), SpO2 95.00%.  General appearance :  Awake, alert, not in any distress. Speech Clear. Not toxic Looking HEENT: Atraumatic and Normocephalic, pupils equally reactive to light and accomodation Neck: supple, no JVD. No cervical lymphadenopathy.  Chest:Good air entry bilaterally, no added sounds  CVS: S1 S2 regular, no murmurs.  Abdomen: Bowel sounds present, Non tender and not distended with no gaurding, rigidity or rebound. Extremities: B/L Lower Ext shows no edema, both legs are warm to touch Neurology: Awake alert, and oriented X 3, CN II-XII intact, Non focal Skin:No Rash Wounds:N/A  LABS ON ADMISSION:   Recent Labs  06/16/13 1111  NA 137  K 4.5  CL 100  CO2 23  GLUCOSE 145*  BUN 17  CREATININE 1.13  CALCIUM 9.6   No results found for this basename: AST, ALT, ALKPHOS, BILITOT, PROT, ALBUMIN,  in the last 72 hours No results found for this basename: LIPASE, AMYLASE,  in the last 72 hours  Recent Labs  06/16/13 1111  WBC 8.1  NEUTROABS 6.0  HGB 12.7*  HCT 37.9*  MCV 92.9  PLT 151    Recent Labs   06/16/13 1103  TROPONINI <0.30   No results found for this basename: DDIMER,  in the last 72 hours No components found with this basename: POCBNP,    RADIOLOGIC STUDIES ON ADMISSION: Dg Chest 2 View  06/16/2013   CLINICAL DATA:  Double central chest pain for several hr  EXAM: CHEST  2 VIEW  COMPARISON:  05/28/2013  FINDINGS: Heart size and vascular pattern are normal. Patient is status post CABG. Lungs clear. Mild hyperinflation suggests an element of COPD.  IMPRESSION: No active cardiopulmonary disease.   Electronically Signed   By: Esperanza Heir M.D.   On: 06/16/2013 11:36     EKG: Independently reviewed. NSR-inverted T wave in infero-lateral leads-similar to old EKG  ASSESSMENT AND PLAN: Present on Admission:  . Chest pain - Suspect pain to be atypical- suspect may be more GI related. Currently no further chest pain - Will admit overnight as observation to telemetry unit, cycle cardiac enzymes. Will continue aspirin, Brilinta statins and beta blockers.  - Will increase Protonix to 40 mg. - Will follow clinical course and monitor patient closely.  . Cardiomyopathy, ischemic - Clinically compensated, continue with Lasix and Coreg.  - echocardiogram done on 05/02/13 showed EF around 50% grade 1 diastolic dysfunction, it did show moderate hypokinesis of the basal-mid inferolateral myocardium.   . Coronary atherosclerosis of native coronary artery- status post CABG and PCI  - See above, continue antiplatelet agents, statins and beta blockers.   . Diabetes mellitus, type II - Hold metformin, start SSI while inpatient.   . Hyperlipidemia - Continue statins   . Hypertension - Currently controlled, continue with furosemide, Coreg and Flomax.   Marland Kitchen BPH (benign prostatic hyperplasia) - Currently stable, continue with Flomax   Further plan will depend as patient's clinical course evolves and further radiologic and laboratory data become available. Patient will be monitored  closely.  DVT Prophylaxis: Prophylactic Lovenox  Code Status: Full Code  Total time spent for admission equals 45 minutes.  [facility] Pager [phone]  If 7PM-7AM, please contact night-coverage [email or web address] Password [email or web address] 06/16/2013, 2:00 PM

## 2013-06-16 NOTE — Progress Notes (Signed)
I was informed by ED nurse that I-stat 8 test was done on wrong person

## 2013-06-16 NOTE — ED Notes (Signed)
Denies any chest pain at present.

## 2013-06-16 NOTE — ED Notes (Signed)
Patient c/o chest pain today for several hours. Took nitroglycerin at home, some relief. Took 325mg  ASA PTA. Patient alert. Denies n/v/shortness of breath.

## 2013-06-16 NOTE — Consult Note (Signed)
Primary cardiologist: Dr. Prentice Docker Consulting cardiologist: Jeffrey [name]  Clinical Summary Jeffrey [name] is a 79 y.o.male with history of CAD with prior 4 vessel CABG in 1989, with subsequent PCIs as described below with most recent DES to SVG to interm 09/2012 in the setting of a STEMI, ischemic cardiomyopathy with previously decreased LVEF now up to 50% as of 04/2013. He has had multiple admissions for chest pain with a recent admission 05/2013 with negative cardiac enzymes and a low risk stress MPI with mild ischemia in territory of known occluded OM graft. Last cath 10/2012: LM 30%, LAD 90% prox followed by complete occlusion more distal, LCX diffuse disease 70% with occluded OM1 and OM2, RCA 90% diffuse disease with bridinging collaterals. SVG to OM is occluded, SVG to ramus is patent with patent stent, LIMA to LAD widely patent with 50% distal disease in the LAD after the LIMA touchdown.  He has been medically managed for his CAD, antianginals have been somewhat limited due to hypotension. He has been able to tolerate low dose coreg.  He reports today after a big breakfast he experienced pain in the epigastric and mid chest area, described as a dull aching 9/10 with no other associated symptoms other than increased belching. He tried taking rolaids without much help, and then NG without much help. He presented to the ER for evaluation, and initially did not want to be admitted until he had a second episode. He currently is chest pain free. Troponins have been negative thus far, EKG shows old inferoposterior infarct with old non-specific ST/T changes lateral precordial leads.  No Known Allergies  Medications Scheduled Medications: . aspirin  325 mg Oral Daily  . atorvastatin  40 mg Oral q1800  . carvedilol  3.125 mg Oral BID WC  . enoxaparin (LOVENOX) injection  40 mg Subcutaneous Q24H  . [START ON 06/17/2013] ferrous sulfate  325 mg Oral Q breakfast  . insulin aspart  0-9  Units Subcutaneous TID WC  . multivitamin with minerals  1 tablet Oral Daily  . pantoprazole  40 mg Oral Daily  . sodium chloride  3 mL Intravenous Q12H  . sodium chloride  3 mL Intravenous Q12H  . tamsulosin  0.4 mg Oral QPC supper  . Ticagrelor  90 mg Oral BID     Infusions:     PRN Medications:  sodium chloride, acetaminophen, acetaminophen, albuterol, albuterol, alum & mag hydroxide-simeth, furosemide, loperamide, morphine injection, nitroGLYCERIN, ondansetron (ZOFRAN) IV, ondansetron, oxyCODONE, potassium chloride SA, sodium chloride   Past Medical History  Diagnosis Date  . Diabetes mellitus, type II   . Hyperlipidemia     Lipid profile in 02/2012:135, 227, 41, 49  . Coronary atherosclerosis of native coronary artery     a. CABG x 4 in 1989 (VG->OM1->OM2, VG->RCA, LIMA->LAD), b. 05/2010: DES to VG-OM1/OM2, DES to distal LCx. c. NSTEMI in 04/2011 - TO distal LCX stent and VG->OM2. d. 02/2012 NSTEMI DES to VG-OM1/continuation to OM2 occluded. e. inferior STEMI s/p DES to SVG-RAMUS 06/2012. f. inferolat STEMI 09/2012 s/p DES to SVG-interm; g. Lex MV (11/14):  EF 35%, inf-lat scar with small peri-infarct ischemia  . Essential hypertension, benign   . Osteoarthritis   . History of stroke   . History of pneumonia   . Cervical vertebral fracture   . Chronic back pain   . Benign prostatic hypertrophy     History of urinary retention  . Peptic ulcer disease   . Gastroesophageal reflux disease   .  Ischemic cardiomyopathy     LVEF 45-50%; Echo (04/2013):  mild LVH, EF 50%, Gr 1 DD, inf-lat HK, inf-septal and ant-lat HK, Tr MR, mild LAE    Past Surgical History  Procedure Laterality Date  . Tonsillectomy    . Coronary artery bypass graft      Family History  Problem Relation Age of Onset  . Early death      Parents died young  . Appendicitis Mother     Pt was 44 year old  . Heart attack Father 29    Social History Jeffrey [name] reports that he quit smoking about 51 years  ago. His smoking use included Cigarettes. He has a 20 pack-year smoking history. His smokeless tobacco use includes Chew. Jeffrey [name] reports that he does not drink alcohol.  Review of Systems CONSTITUTIONAL: No weight loss, fever, chills, weakness or fatigue.  HEENT: Eyes: No visual loss, blurred vision, double vision or yellow sclerae. No hearing loss, sneezing, congestion, runny nose or sore throat.  SKIN: No rash or itching.  CARDIOVASCULAR: per HPI RESPIRATORY: No shortness of breath, cough or sputum.  GASTROINTESTINAL: epigastric pain GENITOURINARY: no polyuria, no dysuria NEUROLOGICAL: No headache, dizziness, syncope, paralysis, ataxia, numbness or tingling in the extremities. No change in bowel or bladder control.  MUSCULOSKELETAL: No muscle, back pain, joint pain or stiffness.  HEMATOLOGIC: No anemia, bleeding or bruising.  LYMPHATICS: No enlarged nodes. No history of splenectomy.  PSYCHIATRIC: No history of depression or anxiety.      Physical Examination Blood pressure 119/70, pulse 84, temperature 97.9 F (36.6 C), temperature source Oral, resp. rate 20, height 5\' 10"  (1.778 m), weight 183 lb (83.008 kg), SpO2 96.00%. No intake or output data in the 24 hours ending 06/16/13 1548  Cardiovascular: RRR, no m/r/g, no JVD, no carotid bruits  Respiratory: CTAB  GI: soft, NT, ND  MSK: extremities are warm, no edema  Neuro: A&Ox3, no focal deficits  Psych: appropriate affect   Lab Results  Basic Metabolic Panel:  Recent Labs Lab 06/16/13 1111  NA 137  K 4.5  CL 100  CO2 23  GLUCOSE 145*  BUN 17  CREATININE 1.13  CALCIUM 9.6    Liver Function Tests: No results found for this basename: AST, ALT, ALKPHOS, BILITOT, PROT, ALBUMIN,  in the last 168 hours  CBC:  Recent Labs Lab 06/16/13 1111  WBC 8.1  NEUTROABS 6.0  HGB 12.7*  HCT 37.9*  MCV 92.9  PLT 151    Cardiac Enzymes:  Recent Labs Lab 06/16/13 1103  TROPONINI <0.30    BNP: No  components found with this basename: POCBNP,    ECG: old inferoposterior infarct with old non-specific ST/T changes lateral precordial leads.    Diagnostic Tests 05/29/13 Stress MPI FINDINGS:  Fixed defect in the inferior lateral walls noted compatible with  prior infarct. The inferolateral defect is slightly more pronounced  on the stress views than the rest. This is suspicious for a small  amount of peri-infarct ischemia.  Wall motion: Global hypokinesis noted with inferior dyskinesis.  Ejection fraction: Calculated QGS ejection fraction is low measuring  35%.  IMPRESSION:  Inferior lateral fixed defect with findings suspicious for a small  amount of peri-infarct ischemia  10/2012 Cath Procedural Findings:  Hemodynamics:  AO 117/58 with a mean of 83 mmHg  LV 117/9 mmHg  Coronary angiography:  Coronary dominance: right  Left mainstem: The left main coronary is heavily calcified. It has mild tapering distally up to 30%.  Left  anterior descending (LAD): The left anterior descending artery is heavily calcified in the proximal vessel. There is a 90% proximal stenosis followed by complete occlusion following a large septal perforator Jeffrey [name].  Left circumflex (LCx): The first and second obtuse marginal vessels are occluded. The left circumflex is diffusely diseased throughout the mid vessel up to 70%. The distal vessel is occluded.  Right coronary artery (RCA): The right coronary is small and diffusely diseased up to 90% throughout the proximal vessel. It is occluded in the mid vessel. There are bridging right to right collaterals as well as left to right collaterals.  The saphenous vein graft to the obtuse marginal vessels is occluded.  The saphenous vein graft to the ramus intermediate Jeffrey [name] is widely patent. The stented segment in the proximal vessel is without significant disease.  The LIMA graft to the LAD is widely patent with good runoff. There is a 50% stenosis in the LAD  following the graft touchdown.  Left ventriculography: Left ventricular systolic function is normal, LVEF is estimated at 55-60%, there is mild inferior basal hypokinesis,there is no significant mitral regurgitation  Final Conclusions:  1. Severe three-vessel obstructive coronary disease.  2. The saphenous vein graft to the ramus intermediate Jeffrey [name] is patent.  3. Patent LIMA graft to the LAD.  4. Occluded saphenous vein graft to the obtuse marginal vessels.  5. Good left ventricular function.  Recommendations: There are no new lesions or recurrent stenosis to explain this patient's chest pain symptoms. I would continue medical management.  06/16/13 CXR EXAM:  CHEST 2 VIEW  COMPARISON: 05/28/2013  FINDINGS:  Heart size and vascular pattern are normal. Patient is status post  CABG. Lungs clear. Mild hyperinflation suggests an element of COPD.  IMPRESSION:  No active cardiopulmonary disease.   Impression/Recommendations 1. Chest pain - patient with extensive history of CAD as described above with previous CABG and multiple PCIs, he has had multiple recent presentations with chest pain with negative workup for ACS. Most recent cath 10/2012 showed severe native vessel disease, patent grafts other than a known occluded SVG to OM. Stress MPI 05/2013 overall low risk with mild ischemia in area of known occluded SVG to OM. His antianginal therapy has been limited due to hypotension - current symptoms more suggestive of GI etiology. Recommend telemetry monitoring overnight with serial EKG and troponins. Continue current medications, will add ranexa as an additional antianginal that will not affect his blood pressure.    Dina Rich, M.D., F.A.C.C.

## 2013-06-16 NOTE — ED Provider Notes (Signed)
CSN: [id number]     Arrival date & time 06/16/13  1033 History  This chart was scribed for Shon Baton, MD by Quintella Reichert, ED scribe.  This patient was seen in room APA07/[facility]07 and the patient's care was started at 11:05 AM.   Chief Complaint  Patient presents with  . Chest Pain    The history is provided by the patient. No language interpreter was used.    HPI Comments: Jeffrey [name] is a 79 y.o. male who presents to the Emergency Department complaining of several hours of severe dull central chest pain that began this morning.  Pt took nitroglycerin at home and 325mg  aspirin with some temporary relief of pain.  Presently he denies pain.  Pain was associated with mild diaphoresis.  He denies nausea, vomiting or SOB.  Pt has h/o ASCVD and ischemic cardiomyopathy and has had approximately 8-10 stents placed.  He had a stress test last week which he states was normal.  Cardiologist is Dr. Patty Sermons.  Review of patient's chart reveals he last had a cardiac catheterization in April of 2014 and a normal stress test last month.    Past Medical History  Diagnosis Date  . Diabetes mellitus, type II   . Hyperlipidemia     Lipid profile in 02/2012:135, 227, 41, 49  . Coronary atherosclerosis of native coronary artery     a. CABG x 4 in 1989 (VG->OM1->OM2, VG->RCA, LIMA->LAD), b. 05/2010: DES to VG-OM1/OM2, DES to distal LCx. c. NSTEMI in 04/2011 - TO distal LCX stent and VG->OM2. d. 02/2012 NSTEMI DES to VG-OM1/continuation to OM2 occluded. e. inferior STEMI s/p DES to SVG-RAMUS 06/2012. f. inferolat STEMI 09/2012 s/p DES to SVG-interm; g. Lex MV (11/14):  EF 35%, inf-lat scar with small peri-infarct ischemia  . Essential hypertension, benign   . Osteoarthritis   . History of stroke   . History of pneumonia   . Cervical vertebral fracture   . Chronic back pain   . Benign prostatic hypertrophy     History of urinary retention  . Peptic ulcer disease   . Gastroesophageal reflux  disease   . Ischemic cardiomyopathy     LVEF 45-50%; Echo (04/2013):  mild LVH, EF 50%, Gr 1 DD, inf-lat HK, inf-septal and ant-lat HK, Tr MR, mild LAE    Past Surgical History  Procedure Laterality Date  . Tonsillectomy    . Coronary artery bypass graft      Family History  Problem Relation Age of Onset  . Early death      Parents died young  . Appendicitis Mother     Pt was 66 year old  . Heart attack Father 59    History  Substance Use Topics  . Smoking status: Former Smoker -- 2.00 packs/day for 10 years    Types: Cigarettes    Quit date: 07/14/1961  . Smokeless tobacco: Current User    Types: Chew  . Alcohol Use: No     Review of Systems  Constitutional: Positive for diaphoresis.  Respiratory: Negative for shortness of breath.   Cardiovascular: Positive for chest pain. Negative for leg swelling.  Gastrointestinal: Negative for nausea, vomiting and abdominal pain.  All other systems reviewed and are negative.     Allergies  Review of patient's allergies indicates no known allergies.  Home Medications   Current Outpatient Rx  Name  Route  Sig  Dispense  Refill  . albuterol (PROVENTIL HFA;VENTOLIN HFA) 108 (90 BASE) MCG/ACT inhaler   Inhalation  Inhale 2 puffs into the lungs every 6 (six) hours as needed for wheezing or shortness of breath.         Marland Kitchen albuterol (PROVENTIL HFA;VENTOLIN HFA) 108 (90 BASE) MCG/ACT inhaler   Inhalation   Inhale 2 puffs into the lungs every 6 (six) hours as needed for wheezing or shortness of breath.   1 Inhaler   0   . aspirin 325 MG tablet   Oral   Take 325 mg by mouth daily.         Marland Kitchen atorvastatin (LIPITOR) 40 MG tablet   Oral   Take 1 tablet (40 mg total) by mouth daily at 6 PM.   30 tablet   11   . carvedilol (COREG) 3.125 MG tablet   Oral   Take 1 tablet (3.125 mg total) by mouth 2 (two) times daily with a meal.   60 tablet   3   . ferrous sulfate 325 (65 FE) MG tablet   Oral   Take 325 mg by mouth  daily with breakfast.         . furosemide (LASIX) 20 MG tablet   Oral   Take 20 mg by mouth daily as needed for fluid. Take 1 tablet as needed if 3 lb weight gain.         Marland Kitchen loperamide (IMODIUM) 2 MG capsule   Oral   Take 2 mg by mouth as needed for diarrhea or loose stools.          . metFORMIN (GLUCOPHAGE) 500 MG tablet   Oral   Take 500 mg by mouth 2 (two) times daily with a meal.         . Multiple Vitamin (MULTIVITAMIN WITH MINERALS) TABS   Oral   Take 1 tablet by mouth daily.         . nitroGLYCERIN (NITROSTAT) 0.4 MG SL tablet   Sublingual   Place 1 tablet (0.4 mg total) under the tongue every 5 (five) minutes x 3 doses as needed for chest pain.   25 tablet   3   . potassium chloride SA (K-DUR,KLOR-CON) 20 MEQ tablet   Oral   Take 20 mEq by mouth daily as needed (Takes only with Lasix).         Marland Kitchen sulfamethoxazole-trimethoprim (BACTRIM,SEPTRA) 400-80 MG per tablet   Oral   Take 1 tablet by mouth daily with supper.          . tamsulosin (FLOMAX) 0.4 MG CAPS   Oral   Take 0.4 mg by mouth daily after supper.          . Ticagrelor (BRILINTA) 90 MG TABS tablet   Oral   Take 1 tablet (90 mg total) by mouth 2 (two) times daily.   60 tablet   6   . pantoprazole (PROTONIX) 20 MG tablet   Oral   Take 20 mg by mouth daily.           BP 101/69  Pulse 89  Temp(Src) 98.5 F (36.9 C) (Oral)  Resp 18  Wt 183 lb (83.008 kg)  Physical Exam  Nursing note and vitals reviewed. Constitutional: He is oriented to person, place, and time. No distress.  elderly  HENT:  Head: Normocephalic and atraumatic.  Eyes: Pupils are equal, round, and reactive to light.  Neck: Neck supple.  Cardiovascular: Normal rate, regular rhythm and normal heart sounds.   No murmur heard. Pulmonary/Chest: Effort normal and breath sounds normal. No respiratory distress. He has  no wheezes.  Abdominal: Soft. There is no tenderness.  Musculoskeletal: He exhibits no edema.   Lymphadenopathy:    He has no cervical adenopathy.  Neurological: He is alert and oriented to person, place, and time.  Skin: Skin is warm and dry.  Psychiatric: He has a normal mood and affect.    ED Course  Procedures (including critical care time)  COORDINATION OF CARE: 11:10 AM: Discussed treatment plan which includes cardiac workup.  Pt expressed understanding and agreed to plan.   Labs Review Labs Reviewed  CBC WITH DIFFERENTIAL - Abnormal; Notable for the following:    RBC 4.08 (*)    Hemoglobin 12.7 (*)    HCT 37.9 (*)    All other components within normal limits  BASIC METABOLIC PANEL - Abnormal; Notable for the following:    Glucose, Bld 145 (*)    GFR calc non Af Amer 59 (*)    GFR calc Af Amer 68 (*)    All other components within normal limits  TROPONIN I   Imaging Review Dg Chest 2 View  06/16/2013   CLINICAL DATA:  Double central chest pain for several hr  EXAM: CHEST  2 VIEW  COMPARISON:  05/28/2013  FINDINGS: Heart size and vascular pattern are normal. Patient is status post CABG. Lungs clear. Mild hyperinflation suggests an element of COPD.  IMPRESSION: No active cardiopulmonary disease.   Electronically Signed   By: Esperanza Heir M.D.   On: 06/16/2013 11:36    EKG Interpretation    Date/Time:  Thursday June 16 2013 10:54:53 EST Ventricular Rate:  77 PR Interval:  172 QRS Duration: 94 QT Interval:  398 QTC Calculation: 450 R Axis:   71 Text Interpretation:  Normal sinus rhythm Nonspecific ST and T wave abnormality Abnormal ECG When compared with ECG of 29-May-2013 05:52, No significant change was found Confirmed by HORTON  MD, COURTNEY ([id number]) on 06/16/2013 1:32:48 PM            MDM   1. Chest pain    This is an 79 year old with extensive cardiac history who presents with chest pain. He is nontoxic-appearing on exam. He started taking a full dose aspirin today. His pain did get better with nitroglycerin and he is currently pain-free.  EKG is unchanged from prior. Chest x-ray is reassuring. Initial troponin is negative. I discussed the patient with Dr. Patty Sermons who is the patient's primary cardiologist. Given several recent admissions with a negative stress test and recent catheterization in April, Dr. Patty Sermons agrees that observation with serial enzymes would be appropriate. I discussed this with the admitting hospitalist.   I personally performed the services described in this documentation, which was scribed in my presence. The recorded information has been reviewed and is accurate.    Shon Baton, MD 06/16/13 [phone]

## 2013-06-17 ENCOUNTER — Telehealth: Payer: Self-pay | Admitting: *Deleted

## 2013-06-17 DIAGNOSIS — I2510 Atherosclerotic heart disease of native coronary artery without angina pectoris: Secondary | ICD-10-CM

## 2013-06-17 LAB — GLUCOSE, CAPILLARY: Glucose-Capillary: 224 mg/dL — ABNORMAL HIGH (ref 70–99)

## 2013-06-17 LAB — CBC
Hemoglobin: 12.7 g/dL — ABNORMAL LOW (ref 13.0–17.0)
MCHC: 33.7 g/dL (ref 30.0–36.0)
Platelets: 132 10*3/uL — ABNORMAL LOW (ref 150–400)
RBC: 4.08 MIL/uL — ABNORMAL LOW (ref 4.22–5.81)

## 2013-06-17 LAB — BASIC METABOLIC PANEL
CO2: 26 mEq/L (ref 19–32)
Creatinine, Ser: 1.01 mg/dL (ref 0.50–1.35)
GFR calc Af Amer: 78 mL/min — ABNORMAL LOW (ref >90)
GFR calc non Af Amer: 67 mL/min — ABNORMAL LOW (ref >90)
Glucose, Bld: 215 mg/dL — ABNORMAL HIGH (ref 70–99)
Potassium: 3.7 mEq/L (ref 3.5–5.1)
Sodium: 136 mEq/L (ref 135–145)

## 2013-06-17 LAB — TROPONIN I: Troponin I: <0.3 ng/mL (ref <0.30)

## 2013-06-17 MED ORDER — ASPIRIN 81 MG PO TABS: 81.0000 mg | ORAL_TABLET | Freq: Every day | ORAL | Status: DC

## 2013-06-17 MED ORDER — ASPIRIN EC 81 MG PO TBEC: 81.0000 mg | DELAYED_RELEASE_TABLET | Freq: Every day | ORAL | Status: DC

## 2013-06-17 MED ORDER — PANTOPRAZOLE SODIUM 40 MG PO TBEC: 40.0000 mg | DELAYED_RELEASE_TABLET | Freq: Every day | ORAL | Status: DC

## 2013-06-17 MED ORDER — RANOLAZINE ER 500 MG PO TB12: 500.0000 mg | ORAL_TABLET | Freq: Two times a day (BID) | ORAL | Status: DC

## 2013-06-17 NOTE — Progress Notes (Signed)
Primary cardiologist:  Subjective:    No chest pain  Objective:   Temp:  [97.9 F (36.6 C)-98.7 F (37.1 C)] 98.7 F (37.1 C) (12/05 0500) Pulse Rate:  [75-89] 75 (12/05 0500) Resp:  [16-20] 17 (12/05 0500) BP: (101-119)/(47-70) 115/60 mmHg (12/05 0500) SpO2:  [95 %-100 %] 100 % (12/05 0500) Weight:  [183 lb (83.008 kg)] 183 lb (83.008 kg) (12/04 1525) Last BM Date: 06/17/13  Filed Weights   06/16/13 1056 06/16/13 1525  Weight: 183 lb (83.008 kg) 183 lb (83.008 kg)    Intake/Output Summary (Last 24 hours) at 06/17/13 0840 Last data filed at 06/17/13 0803  Gross per 24 hour  Intake      3 ml  Output      0 ml  Net      3 ml    Telemetry:sinus rhythm, wenchebach heart block  Exam:  General:NAD  Resp: CTAB  Cardiac: RRR, no m/r/g, no JVD, no carotid bruit  ZO:XWRU, NT, ND  MSK: extremities are warm, no edema  Neuro: no focal deficits  Psych: appropriate affect  Lab Results:  Basic Metabolic Panel:  Recent Labs Lab 06/16/13 1111 06/17/13 0208  NA 137 136  K 4.5 3.7  CL 100 99  CO2 23 26  GLUCOSE 145* 215*  BUN 17 15  CREATININE 1.13 1.01  CALCIUM 9.6 9.0    Liver Function Tests: No results found for this basename: AST, ALT, ALKPHOS, BILITOT, PROT, ALBUMIN,  in the last 168 hours  CBC:  Recent Labs Lab 06/16/13 1111 06/17/13 0208  WBC 8.1 6.7  HGB 12.7* 12.7*  HCT 37.9* 37.7*  MCV 92.9 92.4  PLT 151 132*    Cardiac Enzymes:  Recent Labs Lab 06/16/13 1523 06/16/13 2052 06/17/13 0208  TROPONINI <0.30 <0.30 <0.30    BNP:  Recent Labs  07/29/12 1340 07/29/12 1855 04/27/13 1140  PROBNP 863.6* 845.2* 1023.00*    Coagulation: No results found for this basename: INR,  in the last 168 hours  ECG:   Medications:   Scheduled Medications: . aspirin  325 mg Oral Daily  . atorvastatin  40 mg Oral q1800  . carvedilol  3.125 mg Oral BID WC  . enoxaparin (LOVENOX) injection  40 mg Subcutaneous Q24H  . ferrous sulfate   325 mg Oral Q breakfast  . insulin aspart  0-9 Units Subcutaneous TID WC  . multivitamin with minerals  1 tablet Oral Daily  . pantoprazole  40 mg Oral Daily  . ranolazine  500 mg Oral BID  . sodium chloride  3 mL Intravenous Q12H  . sodium chloride  3 mL Intravenous Q12H  . tamsulosin  0.4 mg Oral QPC supper  . Ticagrelor  90 mg Oral BID     Infusions:     PRN Medications:  sodium chloride, acetaminophen, acetaminophen, albuterol, albuterol, alum & mag hydroxide-simeth, furosemide, loperamide, morphine injection, nitroGLYCERIN, ondansetron (ZOFRAN) IV, ondansetron, oxyCODONE, potassium chloride SA, sodium chloride     Assessment/Plan    1. Chest pain - patient with extensive history of CAD as described above with previous CABG and multiple PCIs, he has had multiple recent presentations with chest pain with negative workup for ACS. Most recent cath 10/2012 showed severe native vessel disease, patent grafts other than a known occluded SVG to OM. Stress MPI 05/2013 overall low risk with mild ischemia in area of known occluded SVG to OM. His antianginal therapy has been limited due to hypotension   - current symptoms more suggestive  of GI etiology. No evidence of ACS by EKG or cardiac enzymes - started ranexa as additional antianginal therapy, changed ASA to 81 mg daily for secondary prevention. Optimal dose of ticagrelor is also 81mg  daily. - ok to discharge from cardiac standpoint, follow up NP Joni Reining in 3 weeks        Dina Rich, M.D., F.A.C.C.

## 2013-06-17 NOTE — Discharge Summary (Signed)
PATIENT DETAILS Name: Jeffrey [name] Age: 79 y.o. Sex: male Date of Birth: May 28, 1929 MRN: [id number]. Admit Date: 06/16/2013 Admitting Physician: Maretta Bees, MD WUJ:WJXBJ,YNWGNFAO J., MD  Recommendations for Outpatient Follow-up:  1. Needs gastroenterology followup.   PRIMARY DISCHARGE DIAGNOSIS:  Principal Problem:   Chest pain Active Problems:   BPH (benign prostatic hyperplasia)   Hyperlipidemia   Diabetes mellitus, type II   Hypertension   Cardiomyopathy, ischemic   Coronary atherosclerosis of native coronary artery      PAST MEDICAL HISTORY: Past Medical History  Diagnosis Date  . Diabetes mellitus, type II   . Hyperlipidemia     Lipid profile in 02/2012:135, 227, 41, 49  . Coronary atherosclerosis of native coronary artery     a. CABG x 4 in 1989 (VG->OM1->OM2, VG->RCA, LIMA->LAD), b. 05/2010: DES to VG-OM1/OM2, DES to distal LCx. c. NSTEMI in 04/2011 - TO distal LCX stent and VG->OM2. d. 02/2012 NSTEMI DES to VG-OM1/continuation to OM2 occluded. e. inferior STEMI s/p DES to SVG-RAMUS 06/2012. f. inferolat STEMI 09/2012 s/p DES to SVG-interm; g. Lex MV (11/14):  EF 35%, inf-lat scar with small peri-infarct ischemia  . Essential hypertension, benign   . Osteoarthritis   . History of stroke   . History of pneumonia   . Cervical vertebral fracture   . Chronic back pain   . Benign prostatic hypertrophy     History of urinary retention  . Peptic ulcer disease   . Gastroesophageal reflux disease   . Ischemic cardiomyopathy     LVEF 45-50%; Echo (04/2013):  mild LVH, EF 50%, Gr 1 DD, inf-lat HK, inf-septal and ant-lat HK, Tr MR, mild LAE    DISCHARGE MEDICATIONS:   Medication List         albuterol 108 (90 BASE) MCG/ACT inhaler  Commonly known as:  PROVENTIL HFA;VENTOLIN HFA  Inhale 2 puffs into the lungs every 6 (six) hours as needed for wheezing or shortness of breath.     albuterol 108 (90 BASE) MCG/ACT inhaler  Commonly known as:  PROVENTIL HFA;VENTOLIN  HFA  Inhale 2 puffs into the lungs every 6 (six) hours as needed for wheezing or shortness of breath.     aspirin 81 MG tablet  Take 1 tablet (81 mg total) by mouth daily.     atorvastatin 40 MG tablet  Commonly known as:  LIPITOR  Take 1 tablet (40 mg total) by mouth daily at 6 PM.     carvedilol 3.125 MG tablet  Commonly known as:  COREG  Take 1 tablet (3.125 mg total) by mouth 2 (two) times daily with a meal.     ferrous sulfate 325 (65 FE) MG tablet  Take 325 mg by mouth daily with breakfast.     furosemide 20 MG tablet  Commonly known as:  LASIX  Take 20 mg by mouth daily as needed for fluid. Take 1 tablet as needed if 3 lb weight gain.     loperamide 2 MG capsule  Commonly known as:  IMODIUM  Take 2 mg by mouth as needed for diarrhea or loose stools.     metFORMIN 500 MG tablet  Commonly known as:  GLUCOPHAGE  Take 500 mg by mouth 2 (two) times daily with a meal.     multivitamin with minerals Tabs tablet  Take 1 tablet by mouth daily.     nitroGLYCERIN 0.4 MG SL tablet  Commonly known as:  NITROSTAT  Place 1 tablet (0.4 mg total) under the tongue every  5 (five) minutes x 3 doses as needed for chest pain.     pantoprazole 40 MG tablet  Commonly known as:  PROTONIX  Take 1 tablet (40 mg total) by mouth daily.     potassium chloride SA 20 MEQ tablet  Commonly known as:  K-DUR,KLOR-CON  Take 20 mEq by mouth daily as needed (Takes only with Lasix).     ranolazine 500 MG 12 hr tablet  Commonly known as:  RANEXA  Take 1 tablet (500 mg total) by mouth 2 (two) times daily.     sulfamethoxazole-trimethoprim 400-80 MG per tablet  Commonly known as:  BACTRIM,SEPTRA  Take 1 tablet by mouth daily with supper.     tamsulosin 0.4 MG Caps capsule  Commonly known as:  FLOMAX  Take 0.4 mg by mouth daily after supper.     Ticagrelor 90 MG Tabs tablet  Commonly known as:  BRILINTA  Take 1 tablet (90 mg total) by mouth 2 (two) times daily.        ALLERGIES:  No Known  Allergies  BRIEF HPI:  See H&P, Labs, Consult and Test reports for all details in brief, patient was admitted for evaluation of chest pain  CONSULTATIONS:   cardiology  PERTINENT RADIOLOGIC STUDIES: Dg Chest 2 View  06/16/2013   CLINICAL DATA:  Double central chest pain for several hr  EXAM: CHEST  2 VIEW  COMPARISON:  05/28/2013  FINDINGS: Heart size and vascular pattern are normal. Patient is status post CABG. Lungs clear. Mild hyperinflation suggests an element of COPD.  IMPRESSION: No active cardiopulmonary disease.   Electronically Signed   By: Esperanza Heir M.D.   On: 06/16/2013 11:36   Dg Chest 2 View  05/21/2013   CLINICAL DATA:  Chest pain.  EXAM: CHEST  2 VIEW  COMPARISON:  04/17/2013 and 05/17/2012  FINDINGS: Sternotomy wires are unchanged. Lungs are adequately inflated without consolidation or effusion. Cardiomediastinal silhouette is within normal. There is calcified plaque over the thoracoabdominal aorta. There spondylosis of the spine.  IMPRESSION: No acute cardiopulmonary disease.   Electronically Signed   By: Elberta Fortis M.D.   On: 05/21/2013 11:22   Nm Myocar Multi W/spect W/wall Motion / Ef  05/29/2013   CLINICAL DATA:  Chest pain  EXAM: MYOCARDIAL IMAGING WITH SPECT (REST AND PHARMACOLOGIC-STRESS)  GATED LEFT VENTRICULAR WALL MOTION STUDY  LEFT VENTRICULAR EJECTION FRACTION  TECHNIQUE: Standard myocardial SPECT imaging was performed after resting intravenous injection of 10 mCi Tc-64m sestamibi. Subsequently, intravenous infusion of Lexiscan was performed under the supervision of the Cardiology staff. At peak effect of the drug, 30 mCi Tc-37m sestamibi was injected intravenously and standard myocardial SPECT imaging was performed. Quantitative gated imaging was also performed to evaluate left ventricular wall motion, and estimate left ventricular ejection fraction.  COMPARISON:  None.  FINDINGS: Fixed defect in the inferior lateral walls noted compatible with prior infarct.  The inferolateral defect is slightly more pronounced on the stress views than the rest. This is suspicious for a small amount of peri-infarct ischemia.  Wall motion:  Global hypokinesis noted with inferior dyskinesis.  Ejection fraction: Calculated QGS ejection fraction is low measuring 35%.  IMPRESSION: Inferior lateral fixed defect with findings suspicious for a small amount of peri-infarct ischemia   Electronically Signed   By: Ruel Favors M.D.   On: 05/29/2013 13:10   Dg Chest Portable 1 View  05/28/2013   CLINICAL DATA:  Chest pain  EXAM: PORTABLE CHEST - 1 VIEW  COMPARISON:  05/21/2013; 04/17/2013; 11/26/2012  FINDINGS: Grossly unchanged cardiac silhouette and mediastinal contours post median sternotomy and CABG. Minimal atherosclerotic plaque within the thoracic aorta. Mild cephalization of flow without frank evidence of edema, possibly artifactual/accentuated due to portable technique. Minimal perihilar atelectasis. No focal airspace opacities. No pleural effusion or pneumothorax. Grossly unchanged bones.  IMPRESSION: No definite acute cardiopulmonary disease on this AP portable examination. Further evaluation with a PA and lateral chest radiograph may be obtained as clinically indicated.   Electronically Signed   By: Simonne Come M.D.   On: 05/28/2013 17:49     PERTINENT LAB RESULTS: CBC:  Recent Labs  06/16/13 1111 06/17/13 0208  WBC 8.1 6.7  HGB 12.7* 12.7*  HCT 37.9* 37.7*  PLT 151 132*   CMET CMP     Component Value Date/Time   NA 136 06/17/2013 0208   K 3.7 06/17/2013 0208   CL 99 06/17/2013 0208   CO2 26 06/17/2013 0208   GLUCOSE 215* 06/17/2013 0208   BUN 15 06/17/2013 0208   CREATININE 1.01 06/17/2013 0208   CREATININE 1.19 04/27/2013 1140   CALCIUM 9.0 06/17/2013 0208   PROT 6.8 05/28/2013 1750   ALBUMIN 3.6 05/28/2013 1750   AST 26 05/28/2013 1750   ALT 27 05/28/2013 1750   ALKPHOS 71 05/28/2013 1750   BILITOT <0.1* 05/28/2013 1750   GFRNONAA 67* 06/17/2013 0208    GFRAA 78* 06/17/2013 0208    GFR Estimated Creatinine Clearance: 58.2 ml/min (by C-G formula based on Cr of 1.01). No results found for this basename: LIPASE, AMYLASE,  in the last 72 hours  Recent Labs  06/16/13 1523 06/16/13 2052 06/17/13 0208  TROPONINI <0.30 <0.30 <0.30   No components found with this basename: POCBNP,  No results found for this basename: DDIMER,  in the last 72 hours No results found for this basename: HGBA1C,  in the last 72 hours No results found for this basename: CHOL, HDL, LDLCALC, TRIG, CHOLHDL, LDLDIRECT,  in the last 72 hours No results found for this basename: TSH, T4TOTAL, FREET3, T3FREE, THYROIDAB,  in the last 72 hours No results found for this basename: VITAMINB12, FOLATE, FERRITIN, TIBC, IRON, RETICCTPCT,  in the last 72 hours Coags: No results found for this basename: PT, INR,  in the last 72 hours Microbiology: No results found for this or any previous visit (from the past 240 hour(s)).   BRIEF HOSPITAL COURSE:   Principal Problem:   Chest pain - Patient was admitted to a telemetry unit, cardiac enzymes were cycled, and this was negative. Patient has had extensive workup including a stress test and a cardiac catheterization earlier this year. His symptoms are more likely suggestive of GI etiology, he claims he had a endoscopy a few years ago and required stretching of the esophagus. Cardiology was consulted during his hospital stay, no further workup is required for their recommendations. I will have patient follow up with gastroenterology, I will increase Protonix to 40 mg daily.  Ranexa has been added for antianginal symptoms.Patient is being discharged home in a stable manner, he does not have any further chest pain since admission.  Active Problems:  Cardiomyopathy, ischemic  - Clinically compensated, continue with Lasix and Coreg on discharge - echocardiogram done on 05/02/13 showed EF around 50% grade 1 diastolic dysfunction, it did show  moderate hypokinesis of the basal-mid inferolateral myocardium.   . Coronary atherosclerosis of native coronary artery- status post CABG and PCI  - See above, continue antiplatelet agents, statins and beta  blockers. Ranexa has been added for antianginal symptoms.  . Diabetes mellitus, type II  - Continue with metformin on discharge   . Hyperlipidemia  - Continue statins   . Hypertension  - Currently controlled, continue with furosemide, Coreg and Flomax.   Marland Kitchen BPH (benign prostatic hyperplasia)  - Currently stable, continue with Flomax. Patient claims he is on a chronic suppressive Bactrim therapy. He has followup with Dr. Jerre Simon.  TODAY-DAY OF DISCHARGE:  Subjective:   Jeffrey [name] today has no headache,no chest abdominal pain,no new weakness tingling or numbness, feels much better wants to go home today.  Objective:   Blood pressure 115/60, pulse 75, temperature 98.7 F (37.1 C), temperature source Oral, resp. rate 17, height 5\' 10"  (1.778 m), weight 83.008 kg (183 lb), SpO2 100.00%.  Intake/Output Summary (Last 24 hours) at 06/17/13 0948 Last data filed at 06/17/13 0803  Gross per 24 hour  Intake      3 ml  Output      0 ml  Net      3 ml   Filed Weights   06/16/13 1056 06/16/13 1525  Weight: 83.008 kg (183 lb) 83.008 kg (183 lb)    Exam Awake Alert, Oriented *3, No new F.N deficits, Normal affect [place].AT,PERRAL Supple Neck,No JVD, No cervical lymphadenopathy appriciated.  Symmetrical Chest wall movement, Good air movement bilaterally, CTAB RRR,No Gallops,Rubs or new Murmurs, No Parasternal Heave +ve B.Sounds, Abd Soft, Non tender, No organomegaly appriciated, No rebound -guarding or rigidity. No Cyanosis, Clubbing or edema, No new Rash or bruise  DISCHARGE CONDITION: Stable  DISPOSITION: Home  DISCHARGE INSTRUCTIONS:    Activity:  As tolerated   Diet recommendation: Diabetic Diet Heart Healthy diet      Discharge Orders   Future Orders Complete By  Expires   Call MD for:  severe uncontrolled pain  As directed    Diet - low sodium heart healthy  As directed    Increase activity slowly  As directed       Follow-up Information   Follow up with Jonette Eva, MD. Schedule an appointment as soon as possible for a visit in 2 weeks.   Specialty:  Gastroenterology   Contact information:   [address] [place] [address] [phone]       Follow up with [name], MD. Schedule an appointment as soon as possible for a visit in 2 weeks.   Specialty:  Internal Medicine   Contact information:   [address] [place] [address] [phone]       Follow up with Joni Reining, NP. Schedule an appointment as soon as possible for a visit in 3 weeks.   Specialty:  Nurse Practitioner   Contact information:   [address] [place] [address] [phone]      Total Time spent on discharge equals 45 minutes.  SignedJeoffrey Massed 06/17/2013 9:48 AM

## 2013-06-17 NOTE — Progress Notes (Signed)
06/17/13 1139 Reviewed discharge instructions with patient. Given copy of instructions, medication list, prescriptions, f/u appointments information. Noted when home medications next due on list. Pt verbalized understanding, states will call for f/u with PCP. Notified patient Dr. Darrick Penna office to call him Monday for f/u. States may call them Monday if he has different phone number. IV site d/c'd per Veleta Miners, NT. Site within normal limits. No c/o chest pain or discomfort at time of discharge. Pt left floor in stable condition via w/c accompanied by nursing staff member. Earnstine Regal, RN

## 2013-06-17 NOTE — Telephone Encounter (Signed)
Floor called and needs 2 week f/u with Dr. Darrick Penna.

## 2013-06-21 ENCOUNTER — Encounter: Payer: Self-pay | Admitting: Gastroenterology

## 2013-06-21 NOTE — Telephone Encounter (Signed)
Pt is aware of OV on 12/31 at 11 with AS and appt card was mailed

## 2013-06-24 ENCOUNTER — Encounter: Payer: Self-pay | Admitting: Adult Health

## 2013-06-24 ENCOUNTER — Ambulatory Visit (INDEPENDENT_AMBULATORY_CARE_PROVIDER_SITE_OTHER): Payer: Medicare Other | Admitting: Adult Health

## 2013-06-24 VITALS — BP 116/60 | HR 88 | Ht 70.0 in | Wt 183.0 lb

## 2013-06-24 DIAGNOSIS — I7090 Unspecified atherosclerosis: Secondary | ICD-10-CM

## 2013-06-24 DIAGNOSIS — I10 Essential (primary) hypertension: Secondary | ICD-10-CM

## 2013-06-24 DIAGNOSIS — I2510 Atherosclerotic heart disease of native coronary artery without angina pectoris: Secondary | ICD-10-CM

## 2013-06-24 MED ORDER — RANOLAZINE ER 500 MG PO TB12: 500.0000 mg | ORAL_TABLET | Freq: Two times a day (BID) | ORAL | Status: DC

## 2013-06-24 NOTE — Patient Instructions (Signed)
Your physician wants you to follow-up in: 6 months  You will receive a reminder letter in the mail two months in advance. If you don't receive a letter, please call our office to schedule the follow-up appointment.  Your physician recommends that you continue on your current medications as directed. Please refer to the Current Medication list given to you today.  

## 2013-06-24 NOTE — Assessment & Plan Note (Signed)
Was readmitted again for recurrent chest pain. He was again ruled out for cardiac etiology of discomfort. Still seems to be related to GI. He had eaten spicey food the morning of and the evening before. He has an appt with GI in 2 weeks. He is advised to avoid spicy, fatty foods, until seeing GI.  He will continue his Ranexa. He lives alone and is easily anxious about his symptoms.

## 2013-06-24 NOTE — Assessment & Plan Note (Signed)
Excellent control of BP at this time. No changes.

## 2013-06-24 NOTE — Progress Notes (Deleted)
Name: Jeffrey [name]    DOB: 11-08-1928  Age: 79 y.o.  MR#: [id number]       PCP:  [name], MD      Insurance: Payor: MEDICARE / Plan: MEDICARE PART A AND B / Product Type: *No Product type* /   CC:    Chief Complaint  Patient presents with  . Coronary Artery Disease    VS Filed Vitals:   06/24/13 1310  BP: 116/60  Pulse: 88  Height: 5\' 10"  (1.778 m)  Weight: 183 lb (83.008 kg)    Weights Current Weight  06/24/13 183 lb (83.008 kg)  06/16/13 183 lb (83.008 kg)  06/13/13 183 lb (83.008 kg)    Blood Pressure  BP Readings from Last 3 Encounters:  06/24/13 116/60  06/17/13 115/60  06/13/13 108/65     Admit date:  (Not on file) Last encounter with RMR:  06/13/2013   Allergy Review of patient's allergies indicates no known allergies.  Current Outpatient Prescriptions  Medication Sig Dispense Refill  . albuterol (PROVENTIL HFA;VENTOLIN HFA) 108 (90 BASE) MCG/ACT inhaler Inhale 2 puffs into the lungs every 6 (six) hours as needed for wheezing or shortness of breath.      Marland Kitchen albuterol (PROVENTIL HFA;VENTOLIN HFA) 108 (90 BASE) MCG/ACT inhaler Inhale 2 puffs into the lungs every 6 (six) hours as needed for wheezing or shortness of breath.  1 Inhaler  0  . aspirin 81 MG tablet Take 1 tablet (81 mg total) by mouth daily.      Marland Kitchen atorvastatin (LIPITOR) 40 MG tablet Take 1 tablet (40 mg total) by mouth daily at 6 PM.  30 tablet  11  . carvedilol (COREG) 3.125 MG tablet Take 1 tablet (3.125 mg total) by mouth 2 (two) times daily with a meal.  60 tablet  3  . ferrous sulfate 325 (65 FE) MG tablet Take 325 mg by mouth daily with breakfast.      . furosemide (LASIX) 20 MG tablet Take 20 mg by mouth daily as needed for fluid. Take 1 tablet as needed if 3 lb weight gain.      Marland Kitchen loperamide (IMODIUM) 2 MG capsule Take 2 mg by mouth as needed for diarrhea or loose stools.       . metFORMIN (GLUCOPHAGE) 500 MG tablet Take 500 mg by mouth 2 (two) times daily with a meal.      . Multiple  Vitamin (MULTIVITAMIN WITH MINERALS) TABS Take 1 tablet by mouth daily.      . nitroGLYCERIN (NITROSTAT) 0.4 MG SL tablet Place 1 tablet (0.4 mg total) under the tongue every 5 (five) minutes x 3 doses as needed for chest pain.  25 tablet  3  . pantoprazole (PROTONIX) 40 MG tablet Take 1 tablet (40 mg total) by mouth daily.  30 tablet  0  . potassium chloride SA (K-DUR,KLOR-CON) 20 MEQ tablet Take 20 mEq by mouth daily as needed (Takes only with Lasix).      . ranolazine (RANEXA) 500 MG 12 hr tablet Take 1 tablet (500 mg total) by mouth 2 (two) times daily.  60 tablet  0  . sulfamethoxazole-trimethoprim (BACTRIM,SEPTRA) 400-80 MG per tablet Take 1 tablet by mouth daily with supper.       . tamsulosin (FLOMAX) 0.4 MG CAPS Take 0.4 mg by mouth daily after supper.       . Ticagrelor (BRILINTA) 90 MG TABS tablet Take 1 tablet (90 mg total) by mouth 2 (two) times daily.  60 tablet  6   No current facility-administered medications for this visit.    Discontinued Meds:   There are no discontinued medications.  Patient Active Problem List   Diagnosis Date Noted  . Chest pain 06/16/2013  . Precordial chest pain 05/29/2013  . Coronary atherosclerosis of native coronary artery 05/29/2013  . Noncompliance 11/22/2012  . Anemia, normocytic normochromic   . Cardiomyopathy, ischemic 06/21/2012  . Cerebrovascular disease 06/08/2012  . Diabetes mellitus, type II   . Hypertension   . Arteriosclerotic cardiovascular disease (ASCVD) 10/25/2011  . Hyperlipidemia 10/24/2011  . BPH (benign prostatic hyperplasia) 05/07/2011    LABS    Component Value Date/Time   NA 136 06/17/2013 0208   NA 137 06/16/2013 1111   NA 134* 05/29/2013 0410   K 3.7 06/17/2013 0208   K 4.5 06/16/2013 1111   K 3.8 05/29/2013 0410   CL 99 06/17/2013 0208   CL 100 06/16/2013 1111   CL 100 05/29/2013 0410   CO2 26 06/17/2013 0208   CO2 23 06/16/2013 1111   CO2 23 05/29/2013 0410   GLUCOSE 215* 06/17/2013 0208   GLUCOSE 145*  06/16/2013 1111   GLUCOSE 185* 05/29/2013 0410   BUN 15 06/17/2013 0208   BUN 17 06/16/2013 1111   BUN 15 05/29/2013 0410   CREATININE 1.01 06/17/2013 0208   CREATININE 1.13 06/16/2013 1111   CREATININE 0.97 05/29/2013 0410   CREATININE 1.19 04/27/2013 1140   CREATININE 0.98 12/04/2011 1208   CALCIUM 9.0 06/17/2013 0208   CALCIUM 9.6 06/16/2013 1111   CALCIUM 9.2 05/29/2013 0410   GFRNONAA 67* 06/17/2013 0208   GFRNONAA 59* 06/16/2013 1111   GFRNONAA 75* 05/29/2013 0410   GFRAA 78* 06/17/2013 0208   GFRAA 68* 06/16/2013 1111   GFRAA 87* 05/29/2013 0410   CMP     Component Value Date/Time   NA 136 06/17/2013 0208   K 3.7 06/17/2013 0208   CL 99 06/17/2013 0208   CO2 26 06/17/2013 0208   GLUCOSE 215* 06/17/2013 0208   BUN 15 06/17/2013 0208   CREATININE 1.01 06/17/2013 0208   CREATININE 1.19 04/27/2013 1140   CALCIUM 9.0 06/17/2013 0208   PROT 6.8 05/28/2013 1750   ALBUMIN 3.6 05/28/2013 1750   AST 26 05/28/2013 1750   ALT 27 05/28/2013 1750   ALKPHOS 71 05/28/2013 1750   BILITOT <0.1* 05/28/2013 1750   GFRNONAA 67* 06/17/2013 0208   GFRAA 78* 06/17/2013 0208       Component Value Date/Time   WBC 6.7 06/17/2013 0208   WBC 8.1 06/16/2013 1111   WBC 6.0 05/29/2013 0410   HGB 12.7* 06/17/2013 0208   HGB 12.7* 06/16/2013 1111   HGB 13.1 05/29/2013 0410   HCT 37.7* 06/17/2013 0208   HCT 37.9* 06/16/2013 1111   HCT 39.4 05/29/2013 0410   MCV 92.4 06/17/2013 0208   MCV 92.9 06/16/2013 1111   MCV 92.9 05/29/2013 0410    Lipid Panel     Component Value Date/Time   CHOL 190 05/29/2013 0410   TRIG 335* 05/29/2013 0410   HDL 33* 05/29/2013 0410   CHOLHDL 5.8 05/29/2013 0410   VLDL 67* 05/29/2013 0410   LDLCALC 90 05/29/2013 0410    ABG    Component Value Date/Time   TCO2 23 10/06/2012 1545     Lab Results  Component Value Date   TSH 3.483 10/16/2012   BNP (last 3 results)  Recent Labs  07/29/12 1340 07/29/12 1855 04/27/13 1140  PROBNP 863.6* 845.2* 1023.00*   Cardiac  Panel  (last 3 results) No results found for this basename: CKTOTAL, CKMB, TROPONINI, RELINDX,  in the last 72 hours  Iron/TIBC/Ferritin No results found for this basename: iron, tibc, ferritin     EKG Orders placed during the hospital encounter of 06/16/13  . EKG 12-LEAD  . EKG 12-LEAD  . EKG     Prior Assessment and Plan Problem List as of 06/24/2013   BPH (benign prostatic hyperplasia)   Last Assessment & Plan   09/09/2012 Office Visit Edited 09/11/2012 11:21 PM by Kathlen Brunswick, MD     Patient advised not to increase his dose of Flomax or other medications without specific instructions and will resume his usual daily dose.  He is scheduled to return to see Dr. Jerre Simon in the near future for catheter removal.    Hyperlipidemia   Last Assessment & Plan   05/06/2013 Office Visit Written 05/06/2013  2:06 PM by Jodelle Gross, NP     Will need to have follow up lipids and LFT's in 3 months.    Arteriosclerotic cardiovascular disease (ASCVD)   Last Assessment & Plan   06/13/2013 Office Visit Written 06/13/2013  3:22 PM by Jodelle Gross, NP     He is doing well without further cardiac complaint. He was ruled out for cardiac etiology for recurrent chest pain on hospitalization in Nov of 2014. He will be continued medical regimen and risk management. I have suggested that he be seen by GI has he has a history of esophageal stricture with dilation years ago with [name]. We will see him in 6 months.    Diabetes mellitus, type II   Last Assessment & Plan   04/27/2013 Office Visit Written 04/27/2013  1:54 PM by Jodelle Gross, NP     Followed by PCP    Hypertension   Last Assessment & Plan   06/13/2013 Office Visit Written 06/13/2013  3:23 PM by Jodelle Gross, NP     BP is well controlled currently. I have asked him to use NTG sparingly to avoid significant hypotension. No changes in his medications.    Cerebrovascular disease   Last Assessment & Plan   09/09/2012 Office  Visit Written 09/09/2012  3:42 PM by Kathlen Brunswick, MD     Recent carotid ultrasound negative for obstruction. Optimal control of cardiovascular risk factors appears effective in preventing progression of disease and will be continued.    Cardiomyopathy, ischemic   Last Assessment & Plan   05/06/2013 Office Visit Written 05/06/2013  2:05 PM by Jodelle Gross, NP     Improved EF per echo this month to 50%. No further evidence of fluid retention. He is breathing better without edema and is down 5 lbs. I have asked him to weigh himself daily. If he gain 3 lbs in 24 hours. He is to take one dose of lasix with potasium that day. He is not to take it on a daily basis. He is reminded of low sodium diet. We will see him in 6 months.    Anemia, normocytic normochromic   Last Assessment & Plan   04/27/2013 Office Visit Written 04/27/2013  1:55 PM by Jodelle Gross, NP     Stable. Most recent lab work last week. Continues on iron replacement.    Noncompliance   Last Assessment & Plan   04/27/2013 Office Visit Written 04/27/2013  1:54 PM by Jodelle Gross, NP     He is not adhering to low sodium  diet. I have reinforced this to him. He is to weigh daily.    Precordial chest pain   Coronary atherosclerosis of native coronary artery   Chest pain       Imaging: Dg Chest 2 View  06/16/2013   CLINICAL DATA:  Double central chest pain for several hr  EXAM: CHEST  2 VIEW  COMPARISON:  05/28/2013  FINDINGS: Heart size and vascular pattern are normal. Patient is status post CABG. Lungs clear. Mild hyperinflation suggests an element of COPD.  IMPRESSION: No active cardiopulmonary disease.   Electronically Signed   By: Esperanza Heir M.D.   On: 06/16/2013 11:36   Nm Myocar Multi W/spect W/wall Motion / Ef  05/29/2013   CLINICAL DATA:  Chest pain  EXAM: MYOCARDIAL IMAGING WITH SPECT (REST AND PHARMACOLOGIC-STRESS)  GATED LEFT VENTRICULAR WALL MOTION STUDY  LEFT VENTRICULAR EJECTION FRACTION   TECHNIQUE: Standard myocardial SPECT imaging was performed after resting intravenous injection of 10 mCi Tc-61m sestamibi. Subsequently, intravenous infusion of Lexiscan was performed under the supervision of the Cardiology staff. At peak effect of the drug, 30 mCi Tc-53m sestamibi was injected intravenously and standard myocardial SPECT imaging was performed. Quantitative gated imaging was also performed to evaluate left ventricular wall motion, and estimate left ventricular ejection fraction.  COMPARISON:  None.  FINDINGS: Fixed defect in the inferior lateral walls noted compatible with prior infarct. The inferolateral defect is slightly more pronounced on the stress views than the rest. This is suspicious for a small amount of peri-infarct ischemia.  Wall motion:  Global hypokinesis noted with inferior dyskinesis.  Ejection fraction: Calculated QGS ejection fraction is low measuring 35%.  IMPRESSION: Inferior lateral fixed defect with findings suspicious for a small amount of peri-infarct ischemia   Electronically Signed   By: Ruel Favors M.D.   On: 05/29/2013 13:10   Dg Chest Portable 1 View  05/28/2013   CLINICAL DATA:  Chest pain  EXAM: PORTABLE CHEST - 1 VIEW  COMPARISON:  05/21/2013; 04/17/2013; 11/26/2012  FINDINGS: Grossly unchanged cardiac silhouette and mediastinal contours post median sternotomy and CABG. Minimal atherosclerotic plaque within the thoracic aorta. Mild cephalization of flow without frank evidence of edema, possibly artifactual/accentuated due to portable technique. Minimal perihilar atelectasis. No focal airspace opacities. No pleural effusion or pneumothorax. Grossly unchanged bones.  IMPRESSION: No definite acute cardiopulmonary disease on this AP portable examination. Further evaluation with a PA and lateral chest radiograph may be obtained as clinically indicated.   Electronically Signed   By: Simonne Come M.D.   On: 05/28/2013 17:49

## 2013-06-24 NOTE — Progress Notes (Signed)
HPI: [name] is an 79 year old patient of [name] we are following for ongoing assessment and management of CAD with frequent admissions secondary to recurrent chest pain all of which were found to be noncardiac in etiology. The patient often goes to the ER when he has discomfort in his chest. The patient has symptoms more likely suggested a GI. He had been deferred to GI it came back to the emergency room before followup with them. He was admitted to rule out myocardial infarction. He was found to be negative. He was discharged on 06/17/2013 and was asked to continue his current medication regimen and followup with GI.   He comes today feeling better. He admits to eating sausage and pizza prior to onset of chest pain. He has an appt with [name], GI on July 12, 2013. He is otherwise doing well.  No Known Allergies  Current Outpatient Prescriptions  Medication Sig Dispense Refill  . albuterol (PROVENTIL HFA;VENTOLIN HFA) 108 (90 BASE) MCG/ACT inhaler Inhale 2 puffs into the lungs every 6 (six) hours as needed for wheezing or shortness of breath.      Marland Kitchen albuterol (PROVENTIL HFA;VENTOLIN HFA) 108 (90 BASE) MCG/ACT inhaler Inhale 2 puffs into the lungs every 6 (six) hours as needed for wheezing or shortness of breath.  1 Inhaler  0  . aspirin 81 MG tablet Take 1 tablet (81 mg total) by mouth daily.      Marland Kitchen atorvastatin (LIPITOR) 40 MG tablet Take 1 tablet (40 mg total) by mouth daily at 6 PM.  30 tablet  11  . carvedilol (COREG) 3.125 MG tablet Take 1 tablet (3.125 mg total) by mouth 2 (two) times daily with a meal.  60 tablet  3  . ferrous sulfate 325 (65 FE) MG tablet Take 325 mg by mouth daily with breakfast.      . furosemide (LASIX) 20 MG tablet Take 20 mg by mouth daily as needed for fluid. Take 1 tablet as needed if 3 lb weight gain.      Marland Kitchen loperamide (IMODIUM) 2 MG capsule Take 2 mg by mouth as needed for diarrhea or loose stools.       . metFORMIN (GLUCOPHAGE) 500 MG  tablet Take 500 mg by mouth 2 (two) times daily with a meal.      . Multiple Vitamin (MULTIVITAMIN WITH MINERALS) TABS Take 1 tablet by mouth daily.      . nitroGLYCERIN (NITROSTAT) 0.4 MG SL tablet Place 1 tablet (0.4 mg total) under the tongue every 5 (five) minutes x 3 doses as needed for chest pain.  25 tablet  3  . pantoprazole (PROTONIX) 40 MG tablet Take 1 tablet (40 mg total) by mouth daily.  30 tablet  0  . potassium chloride SA (K-DUR,KLOR-CON) 20 MEQ tablet Take 20 mEq by mouth daily as needed (Takes only with Lasix).      . ranolazine (RANEXA) 500 MG 12 hr tablet Take 1 tablet (500 mg total) by mouth 2 (two) times daily.  60 tablet  6  . sulfamethoxazole-trimethoprim (BACTRIM,SEPTRA) 400-80 MG per tablet Take 1 tablet by mouth daily with supper.       . tamsulosin (FLOMAX) 0.4 MG CAPS Take 0.4 mg by mouth daily after supper.       . Ticagrelor (BRILINTA) 90 MG TABS tablet Take 1 tablet (90 mg total) by mouth 2 (two) times daily.  60 tablet  6   No current facility-administered medications for this visit.  Past Medical History  Diagnosis Date  . Diabetes mellitus, type II   . Hyperlipidemia     Lipid profile in 02/2012:135, 227, 41, 49  . Coronary atherosclerosis of native coronary artery     a. CABG x 4 in 1989 (VG->OM1->OM2, VG->RCA, LIMA->LAD), b. 05/2010: DES to VG-OM1/OM2, DES to distal LCx. c. NSTEMI in 04/2011 - TO distal LCX stent and VG->OM2. d. 02/2012 NSTEMI DES to VG-OM1/continuation to OM2 occluded. e. inferior STEMI s/p DES to SVG-RAMUS 06/2012. f. inferolat STEMI 09/2012 s/p DES to SVG-interm; g. Lex MV (11/14):  EF 35%, inf-lat scar with small peri-infarct ischemia  . Essential hypertension, benign   . Osteoarthritis   . History of stroke   . History of pneumonia   . Cervical vertebral fracture   . Chronic back pain   . Benign prostatic hypertrophy     History of urinary retention  . Peptic ulcer disease   . Gastroesophageal reflux disease   . Ischemic  cardiomyopathy     LVEF 45-50%; Echo (04/2013):  mild LVH, EF 50%, Gr 1 DD, inf-lat HK, inf-septal and ant-lat HK, Tr MR, mild LAE    Past Surgical History  Procedure Laterality Date  . Tonsillectomy    . Coronary artery bypass graft      ZOX:WRUEAV of systems complete and found to be negative unless listed above  PHYSICAL EXAM BP 116/60  Pulse 88  Ht 5\' 10"  (1.778 m)  Wt 183 lb (83.008 kg)  BMI 26.26 kg/m2  General: Well developed, well nourished, in no acute distress Head: Eyes PERRLA, No xanthomas.   Normal cephalic and atramatic  Lungs: Clear bilaterally to auscultation and percussion. Heart: HRRR S1 S2, without MRG.  Pulses are 2+ & equal.            No carotid bruit. No JVD.  No abdominal bruits. No femoral bruits. Abdomen: Bowel sounds are positive, abdomen soft and non-tender without masses or                  Hernia's noted. Msk:  Back normal, normal gait. Normal strength and tone for age. Extremities: No clubbing, cyanosis or edema.  DP +1 Neuro: Alert and oriented X 3. Psych:  Good affect, responds appropriately    ASSESSMENT AND PLAN

## 2013-07-13 ENCOUNTER — Encounter: Payer: Self-pay | Admitting: Gastroenterology

## 2013-07-13 ENCOUNTER — Ambulatory Visit: Payer: Medicare Other | Admitting: Gastroenterology

## 2013-07-13 ENCOUNTER — Telehealth: Payer: Self-pay | Admitting: Gastroenterology

## 2013-07-13 NOTE — Telephone Encounter (Signed)
Pt was a no show

## 2013-07-13 NOTE — Telephone Encounter (Signed)
Mailed letter °

## 2013-07-13 NOTE — Telephone Encounter (Signed)
Please send letter.

## 2013-07-27 ENCOUNTER — Telehealth: Payer: Self-pay | Admitting: *Deleted

## 2013-07-27 NOTE — Telephone Encounter (Signed)
Pt needs a refill on proair inhaler. [employer]

## 2013-07-27 NOTE — Telephone Encounter (Signed)
Refused. Needs to be refilled by PCP. Courtesy from cardiologist until seen by PCP.

## 2013-07-28 ENCOUNTER — Other Ambulatory Visit: Payer: Self-pay | Admitting: *Deleted

## 2013-07-28 ENCOUNTER — Telehealth: Payer: Self-pay | Admitting: *Deleted

## 2013-07-28 MED ORDER — ALBUTEROL SULFATE HFA 108 (90 BASE) MCG/ACT IN AERS: 2.0000 | INHALATION_SPRAY | Freq: Four times a day (QID) | RESPIRATORY_TRACT | Status: DC | PRN

## 2013-07-28 NOTE — Telephone Encounter (Signed)
Pt walked into office. Needed refill on Inhaler, pt states he does not have a PCP. Told pt for courtesy this nurse will refill one time , so he can find a PCP or go to the health dept. Pt states he understands, even though he was told this before. If pt comes into office again, make sure he has found a PCP.

## 2013-08-07 ENCOUNTER — Encounter (HOSPITAL_COMMUNITY): Payer: Self-pay | Admitting: Emergency Medicine

## 2013-08-07 ENCOUNTER — Emergency Department (HOSPITAL_COMMUNITY)
Admission: EM | Admit: 2013-08-07 | Discharge: 2013-08-07 | Disposition: A | Discharge status: Home / Self Care | Payer: Medicare Other | Attending: Emergency Medicine | Admitting: Emergency Medicine

## 2013-08-07 DIAGNOSIS — Z8701 Personal history of pneumonia (recurrent): Secondary | ICD-10-CM | POA: Insufficient documentation

## 2013-08-07 DIAGNOSIS — I2510 Atherosclerotic heart disease of native coronary artery without angina pectoris: Secondary | ICD-10-CM | POA: Insufficient documentation

## 2013-08-07 DIAGNOSIS — Z7982 Long term (current) use of aspirin: Secondary | ICD-10-CM | POA: Insufficient documentation

## 2013-08-07 DIAGNOSIS — N400 Enlarged prostate without lower urinary tract symptoms: Secondary | ICD-10-CM | POA: Insufficient documentation

## 2013-08-07 DIAGNOSIS — E119 Type 2 diabetes mellitus without complications: Secondary | ICD-10-CM | POA: Insufficient documentation

## 2013-08-07 DIAGNOSIS — Z951 Presence of aortocoronary bypass graft: Secondary | ICD-10-CM | POA: Insufficient documentation

## 2013-08-07 DIAGNOSIS — I10 Essential (primary) hypertension: Secondary | ICD-10-CM | POA: Insufficient documentation

## 2013-08-07 DIAGNOSIS — M1990 Unspecified osteoarthritis, unspecified site: Secondary | ICD-10-CM | POA: Insufficient documentation

## 2013-08-07 DIAGNOSIS — Z79899 Other long term (current) drug therapy: Secondary | ICD-10-CM | POA: Insufficient documentation

## 2013-08-07 DIAGNOSIS — Z8711 Personal history of peptic ulcer disease: Secondary | ICD-10-CM | POA: Insufficient documentation

## 2013-08-07 DIAGNOSIS — Z8673 Personal history of transient ischemic attack (TIA), and cerebral infarction without residual deficits: Secondary | ICD-10-CM | POA: Insufficient documentation

## 2013-08-07 DIAGNOSIS — Z8781 Personal history of (healed) traumatic fracture: Secondary | ICD-10-CM | POA: Insufficient documentation

## 2013-08-07 DIAGNOSIS — R109 Unspecified abdominal pain: Secondary | ICD-10-CM

## 2013-08-07 DIAGNOSIS — R1013 Epigastric pain: Secondary | ICD-10-CM | POA: Insufficient documentation

## 2013-08-07 DIAGNOSIS — G8929 Other chronic pain: Secondary | ICD-10-CM | POA: Insufficient documentation

## 2013-08-07 DIAGNOSIS — K921 Melena: Secondary | ICD-10-CM | POA: Insufficient documentation

## 2013-08-07 DIAGNOSIS — K219 Gastro-esophageal reflux disease without esophagitis: Secondary | ICD-10-CM | POA: Insufficient documentation

## 2013-08-07 DIAGNOSIS — M549 Dorsalgia, unspecified: Secondary | ICD-10-CM | POA: Insufficient documentation

## 2013-08-07 LAB — COMPREHENSIVE METABOLIC PANEL
ALBUMIN: 4 g/dL (ref 3.5–5.2)
ALT: 32 U/L (ref 0–53)
AST: 32 U/L (ref 0–37)
Alkaline Phosphatase: 82 U/L (ref 39–117)
BILIRUBIN TOTAL: 0.3 mg/dL (ref 0.3–1.2)
BUN: 15 mg/dL (ref 6–23)
CHLORIDE: 99 meq/L (ref 96–112)
CO2: 22 meq/L (ref 19–32)
Calcium: 9.3 mg/dL (ref 8.4–10.5)
Creatinine, Ser: 1.29 mg/dL (ref 0.50–1.35)
GFR calc Af Amer: 58 mL/min — ABNORMAL LOW (ref >90)
GFR, EST NON AFRICAN AMERICAN: 50 mL/min — AB (ref >90)
Glucose, Bld: 171 mg/dL — ABNORMAL HIGH (ref 70–99)
Potassium: 4.6 mEq/L (ref 3.7–5.3)
Sodium: 135 mEq/L — ABNORMAL LOW (ref 137–147)
Total Protein: 7.1 g/dL (ref 6.0–8.3)

## 2013-08-07 LAB — CBC WITH DIFFERENTIAL/PLATELET
BASOS ABS: 0 10*3/uL (ref 0.0–0.1)
Basophils Relative: 0 % (ref 0–1)
Eosinophils Absolute: 0.4 10*3/uL (ref 0.0–0.7)
Eosinophils Relative: 5 % (ref 0–5)
HCT: 39.9 % (ref 39.0–52.0)
HEMOGLOBIN: 13.7 g/dL (ref 13.0–17.0)
LYMPHS PCT: 16 % (ref 12–46)
Lymphs Abs: 1.2 10*3/uL (ref 0.7–4.0)
MCH: 32.2 pg (ref 26.0–34.0)
MCHC: 34.3 g/dL (ref 30.0–36.0)
MCV: 93.7 fL (ref 78.0–100.0)
Monocytes Absolute: 0.9 10*3/uL (ref 0.1–1.0)
Monocytes Relative: 11 % (ref 3–12)
NEUTROS ABS: 5.3 10*3/uL (ref 1.7–7.7)
NEUTROS PCT: 68 % (ref 43–77)
Platelets: 151 10*3/uL (ref 150–400)
RBC: 4.26 MIL/uL (ref 4.22–5.81)
RDW: 14 % (ref 11.5–15.5)
WBC: 7.8 10*3/uL (ref 4.0–10.5)

## 2013-08-07 LAB — LIPASE, BLOOD: Lipase: 32 U/L (ref 11–59)

## 2013-08-07 LAB — TROPONIN I: Troponin I: <0.3 ng/mL (ref <0.30)

## 2013-08-07 NOTE — ED Provider Notes (Signed)
CSN: [id number]     Arrival date & time 08/07/13  1317 History  This chart was scribed for Sharyon Cable, MD by Roxan Diesel, ED scribe.  This patient was seen in room APA19/APA19 and the patient's care was started at 3:35 PM.   Chief Complaint  Patient presents with  . Abdominal Pain    Patient is a 80 y.o. male presenting with abdominal pain. The history is provided by the patient. No language interpreter was used.  Abdominal Pain Pain location:  Epigastric Pain radiates to:  Does not radiate Pain severity:  Moderate Duration: Several hours. Timing:  Constant Progression:  Resolved Chronicity:  New Context: laxative use   Relieved by: Took one nitro and 325mg  aspirin. Associated symptoms: melena (pt attributes to Pepto Bismol)   Associated symptoms: no chest pain, no diarrhea and no vomiting     HPI Comments: Jeffrey [name] is a 80 y.o. male with h/o PUD who presents to the Emergency Department complaining of moderate epigastric abdominal pain that began this morning.  Pt localizes his pain beneath his ribs.  He states that he took one nitroglycerin and one 325mg  aspirin pta which relieved the pain and currently he denies pain.  Pt denies prior h/o similar pain and states that his abdominal pain was "so high" he was concerned it may be heart-related.  Pain was not radiating into chest or back.  He notes that he took 2 laxatives last night.and moved his bowels "half a dozen times" this morning.  He states he had one black stool this morning which he attributes to taking Pepto Bismol but he denies any other black or bloody stool.  He denies vomiting, diaphoresis, or dysuria.  He denies h/o abdominal surgery.   Past Medical History  Diagnosis Date  . Diabetes mellitus, type II   . Hyperlipidemia     Lipid profile in 02/2012:135, 227, 41, 49  . Coronary atherosclerosis of native coronary artery     a. CABG x 4 in 1989 (VG->OM1->OM2, VG->RCA, LIMA->LAD), b. 05/2010: DES to  VG-OM1/OM2, DES to distal LCx. c. NSTEMI in 04/2011 - TO distal LCX stent and VG->OM2. d. 02/2012 NSTEMI DES to VG-OM1/continuation to OM2 occluded. e. inferior STEMI s/p DES to SVG-RAMUS 06/2012. f. inferolat STEMI 09/2012 s/p DES to SVG-interm; g. Lex MV (11/14):  EF 35%, inf-lat scar with small peri-infarct ischemia  . Essential hypertension, benign   . Osteoarthritis   . History of stroke   . History of pneumonia   . Cervical vertebral fracture   . Chronic back pain   . Benign prostatic hypertrophy     History of urinary retention  . Peptic ulcer disease   . Gastroesophageal reflux disease   . Ischemic cardiomyopathy     LVEF 45-50%; Echo (04/2013):  mild LVH, EF 50%, Gr 1 DD, inf-lat HK, inf-septal and ant-lat HK, Tr MR, mild LAE    Past Surgical History  Procedure Laterality Date  . Tonsillectomy    . Coronary artery bypass graft      Family History  Problem Relation Age of Onset  . Early death      Parents died young  . Appendicitis Mother     Pt was 89 year old  . Heart attack Father 67    History  Substance Use Topics  . Smoking status: Former Smoker -- 2.00 packs/day for 10 years    Types: Cigarettes    Quit date: 07/14/1961  . Smokeless tobacco: Current User  Types: Chew  . Alcohol Use: No     Review of Systems  Constitutional: Negative for diaphoresis.  Cardiovascular: Negative for chest pain.  Gastrointestinal: Positive for abdominal pain and melena (pt attributes to Pepto Bismol). Negative for vomiting and diarrhea.  All other systems reviewed and are negative.     Allergies  Review of patient's allergies indicates no known allergies.  Home Medications   Current Outpatient Rx  Name  Route  Sig  Dispense  Refill  . albuterol (PROVENTIL HFA;VENTOLIN HFA) 108 (90 BASE) MCG/ACT inhaler   Inhalation   Inhale 2 puffs into the lungs every 6 (six) hours as needed for wheezing or shortness of breath.         Marland Kitchen albuterol (PROVENTIL HFA;VENTOLIN HFA)  108 (90 BASE) MCG/ACT inhaler   Inhalation   Inhale 2 puffs into the lungs every 6 (six) hours as needed for wheezing or shortness of breath.   1 Inhaler   0     After this refill, needs to be refilled by a PCP.   Marland Kitchen aspirin 81 MG tablet   Oral   Take 1 tablet (81 mg total) by mouth daily.         Marland Kitchen atorvastatin (LIPITOR) 40 MG tablet   Oral   Take 1 tablet (40 mg total) by mouth daily at 6 PM.   30 tablet   11   . carvedilol (COREG) 3.125 MG tablet   Oral   Take 1 tablet (3.125 mg total) by mouth 2 (two) times daily with a meal.   60 tablet   3   . ferrous sulfate 325 (65 FE) MG tablet   Oral   Take 325 mg by mouth daily with breakfast.         . furosemide (LASIX) 20 MG tablet   Oral   Take 20 mg by mouth daily as needed for fluid. Take 1 tablet as needed if 3 lb weight gain.         Marland Kitchen loperamide (IMODIUM) 2 MG capsule   Oral   Take 2 mg by mouth as needed for diarrhea or loose stools.          . metFORMIN (GLUCOPHAGE) 500 MG tablet   Oral   Take 500 mg by mouth 2 (two) times daily with a meal.         . Multiple Vitamin (MULTIVITAMIN WITH MINERALS) TABS   Oral   Take 1 tablet by mouth daily.         . nitroGLYCERIN (NITROSTAT) 0.4 MG SL tablet   Sublingual   Place 1 tablet (0.4 mg total) under the tongue every 5 (five) minutes x 3 doses as needed for chest pain.   25 tablet   3   . pantoprazole (PROTONIX) 40 MG tablet   Oral   Take 1 tablet (40 mg total) by mouth daily.   30 tablet   0   . potassium chloride SA (K-DUR,KLOR-CON) 20 MEQ tablet   Oral   Take 20 mEq by mouth daily as needed (Takes only with Lasix).         . ranolazine (RANEXA) 500 MG 12 hr tablet   Oral   Take 1 tablet (500 mg total) by mouth 2 (two) times daily.   60 tablet   6   . sulfamethoxazole-trimethoprim (BACTRIM,SEPTRA) 400-80 MG per tablet   Oral   Take 1 tablet by mouth daily with supper.          Marland Kitchen  tamsulosin (FLOMAX) 0.4 MG CAPS   Oral   Take 0.4 mg  by mouth daily after supper.          . Ticagrelor (BRILINTA) 90 MG TABS tablet   Oral   Take 1 tablet (90 mg total) by mouth 2 (two) times daily.   60 tablet   6    BP 95/60  Pulse 84  Temp(Src) 97.6 F (36.4 C) (Oral)  Resp 18  Ht 5\' 10"  (1.778 m)  Wt 180 lb (81.647 kg)  BMI 25.83 kg/m2  SpO2 91%   Physical Exam  Nursing note and vitals reviewed. CONSTITUTIONAL: Well developed/well nourished HEAD: Normocephalic/atraumatic EYES: EOMI/PERRL, no scleral icterus ENMT: Mucous membranes moist NECK: supple no meningeal signs SPINE:entire spine nontender CV: S1/S2 noted, no murmurs/rubs/gallops noted LUNGS: Lungs are clear to auscultation bilaterally, no apparent distress ABDOMEN: soft, nontender, no rebound or guarding GU:no cva tenderness RECTAL: no gross blood or melena, hemoccult negative (chaperone present) NEURO: Pt is awake/alert, moves all extremitiesx4 EXTREMITIES: pulses normal, full ROM SKIN: warm, color normal PSYCH: no abnormalities of mood noted   ED Course  Procedures (including critical care time)  DIAGNOSTIC STUDIES: Oxygen Saturation is 91% on room air, low by my interpretation.    COORDINATION OF CARE: 3:44 PM-Discussed treatment plan which includes labs with pt at bedside and pt agreed to plan.   Pt without any focal tenderness, he is well appearing, no signs of GI bleed He reports this is unlike previous cardiac chest pain Doubt ACS We discussed strict return precautions Stable for d/c home BP 121/60  Pulse 71  Temp(Src) 98.1 F (36.7 C) (Oral)  Resp 17  Ht 5\' 10"  (1.778 m)  Wt 180 lb (81.647 kg)  BMI 25.83 kg/m2  SpO2 96%   Labs Review Labs Reviewed  COMPREHENSIVE METABOLIC PANEL - Abnormal; Notable for the following:    Sodium 135 (*)    Glucose, Bld 171 (*)    GFR calc non Af Amer 50 (*)    GFR calc Af Amer 58 (*)    All other components within normal limits  CBC WITH DIFFERENTIAL  TROPONIN I  LIPASE, BLOOD   Imaging  Review No results found.  EKG Interpretation    Date/Time:  Sunday August 07 2013 13:49:08 EST Ventricular Rate:  83 PR Interval:  122 QRS Duration: 100 QT Interval:  414 QTC Calculation: 486 R Axis:   74 Text Interpretation:  Sinus rhythm with occasional Premature ventricular complexes Possible Left atrial enlargement Possible Inferior infarct , age undetermined ST \\T \ T wave abnormality, consider lateral ischemia Abnormal ECG When compared with ECG of 16-Jun-2013 10:54, Premature ventricular complexes are now Present Inverted T waves have replaced nonspecific T wave abnormality in Inferior leads Confirmed by [place] [phone]) on 08/07/2013 3:31:22 PM            MDM  No diagnosis found. Nursing notes including past medical history and social history reviewed and considered in documentation Labs/vital reviewed and considered     I personally performed the services described in this documentation, which was scribed in my presence. The recorded information has been reviewed and is accurate.      Sharyon Cable, MD 08/07/13 [phone]

## 2013-08-07 NOTE — ED Notes (Signed)
Pt states that this pain is different than what he has had with his heart problems in the past.

## 2013-08-07 NOTE — ED Notes (Signed)
Patient with no complaints at this time. Respirations even and unlabored. Skin warm/dry. Discharge instructions reviewed with patient at this time. Patient given opportunity to voice concerns/ask questions. Patient discharged at this time and left Emergency Department with steady gait.   

## 2013-08-07 NOTE — Discharge Instructions (Signed)

## 2013-08-07 NOTE — ED Notes (Signed)
Pt c/o epigastric pain that started this am, denies any n/v, admits to "a little sob", states that he took one nitro and 325 mg aspirin which relieved the pain. Pt arrives to er states "I just don't feel good"

## 2013-08-08 ENCOUNTER — Emergency Department (HOSPITAL_COMMUNITY)
Admission: EM | Admit: 2013-08-08 | Discharge: 2013-08-08 | Disposition: A | Discharge status: Home / Self Care | Payer: Medicare Other | Attending: Emergency Medicine | Admitting: Emergency Medicine

## 2013-08-08 ENCOUNTER — Encounter (HOSPITAL_COMMUNITY): Payer: Self-pay | Admitting: Emergency Medicine

## 2013-08-08 DIAGNOSIS — N400 Enlarged prostate without lower urinary tract symptoms: Secondary | ICD-10-CM | POA: Insufficient documentation

## 2013-08-08 DIAGNOSIS — Z951 Presence of aortocoronary bypass graft: Secondary | ICD-10-CM | POA: Insufficient documentation

## 2013-08-08 DIAGNOSIS — Z87891 Personal history of nicotine dependence: Secondary | ICD-10-CM | POA: Insufficient documentation

## 2013-08-08 DIAGNOSIS — G8929 Other chronic pain: Secondary | ICD-10-CM | POA: Insufficient documentation

## 2013-08-08 DIAGNOSIS — Z79899 Other long term (current) drug therapy: Secondary | ICD-10-CM | POA: Insufficient documentation

## 2013-08-08 DIAGNOSIS — R5383 Other fatigue: Principal | ICD-10-CM

## 2013-08-08 DIAGNOSIS — E785 Hyperlipidemia, unspecified: Secondary | ICD-10-CM | POA: Insufficient documentation

## 2013-08-08 DIAGNOSIS — R531 Weakness: Secondary | ICD-10-CM

## 2013-08-08 DIAGNOSIS — K219 Gastro-esophageal reflux disease without esophagitis: Secondary | ICD-10-CM | POA: Insufficient documentation

## 2013-08-08 DIAGNOSIS — Z7982 Long term (current) use of aspirin: Secondary | ICD-10-CM | POA: Insufficient documentation

## 2013-08-08 DIAGNOSIS — Z8673 Personal history of transient ischemic attack (TIA), and cerebral infarction without residual deficits: Secondary | ICD-10-CM | POA: Insufficient documentation

## 2013-08-08 DIAGNOSIS — Z792 Long term (current) use of antibiotics: Secondary | ICD-10-CM | POA: Insufficient documentation

## 2013-08-08 DIAGNOSIS — Z8711 Personal history of peptic ulcer disease: Secondary | ICD-10-CM | POA: Insufficient documentation

## 2013-08-08 DIAGNOSIS — Z8781 Personal history of (healed) traumatic fracture: Secondary | ICD-10-CM | POA: Insufficient documentation

## 2013-08-08 DIAGNOSIS — I2510 Atherosclerotic heart disease of native coronary artery without angina pectoris: Secondary | ICD-10-CM | POA: Insufficient documentation

## 2013-08-08 DIAGNOSIS — M1990 Unspecified osteoarthritis, unspecified site: Secondary | ICD-10-CM | POA: Insufficient documentation

## 2013-08-08 DIAGNOSIS — I10 Essential (primary) hypertension: Secondary | ICD-10-CM | POA: Insufficient documentation

## 2013-08-08 DIAGNOSIS — Z8701 Personal history of pneumonia (recurrent): Secondary | ICD-10-CM | POA: Insufficient documentation

## 2013-08-08 DIAGNOSIS — E119 Type 2 diabetes mellitus without complications: Secondary | ICD-10-CM | POA: Insufficient documentation

## 2013-08-08 DIAGNOSIS — R5381 Other malaise: Secondary | ICD-10-CM | POA: Insufficient documentation

## 2013-08-08 LAB — URINALYSIS, ROUTINE W REFLEX MICROSCOPIC
Bilirubin Urine: NEGATIVE
GLUCOSE, UA: 100 mg/dL — AB
HGB URINE DIPSTICK: NEGATIVE
KETONES UR: NEGATIVE mg/dL
LEUKOCYTES UA: NEGATIVE
Nitrite: NEGATIVE
PH: 6 (ref 5.0–8.0)
Protein, ur: NEGATIVE mg/dL
Specific Gravity, Urine: 1.025 (ref 1.005–1.030)
Urobilinogen, UA: 0.2 mg/dL (ref 0.0–1.0)

## 2013-08-08 LAB — CBC WITH DIFFERENTIAL/PLATELET
BASOS ABS: 0 10*3/uL (ref 0.0–0.1)
Basophils Relative: 0 % (ref 0–1)
Eosinophils Absolute: 0.2 10*3/uL (ref 0.0–0.7)
Eosinophils Relative: 5 % (ref 0–5)
HEMATOCRIT: 38.6 % — AB (ref 39.0–52.0)
HEMOGLOBIN: 13.1 g/dL (ref 13.0–17.0)
LYMPHS ABS: 1.1 10*3/uL (ref 0.7–4.0)
LYMPHS PCT: 21 % (ref 12–46)
MCH: 32 pg (ref 26.0–34.0)
MCHC: 33.9 g/dL (ref 30.0–36.0)
MCV: 94.1 fL (ref 78.0–100.0)
MONO ABS: 0.4 10*3/uL (ref 0.1–1.0)
MONOS PCT: 8 % (ref 3–12)
Neutro Abs: 3.5 10*3/uL (ref 1.7–7.7)
Neutrophils Relative %: 66 % (ref 43–77)
Platelets: 150 10*3/uL (ref 150–400)
RBC: 4.1 MIL/uL — ABNORMAL LOW (ref 4.22–5.81)
RDW: 14 % (ref 11.5–15.5)
WBC: 5.2 10*3/uL (ref 4.0–10.5)

## 2013-08-08 LAB — BASIC METABOLIC PANEL
BUN: 15 mg/dL (ref 6–23)
CHLORIDE: 101 meq/L (ref 96–112)
CO2: 22 mEq/L (ref 19–32)
CREATININE: 1.31 mg/dL (ref 0.50–1.35)
Calcium: 9.3 mg/dL (ref 8.4–10.5)
GFR calc Af Amer: 57 mL/min — ABNORMAL LOW (ref >90)
GFR calc non Af Amer: 49 mL/min — ABNORMAL LOW (ref >90)
GLUCOSE: 167 mg/dL — AB (ref 70–99)
Potassium: 5 mEq/L (ref 3.7–5.3)
Sodium: 135 mEq/L — ABNORMAL LOW (ref 137–147)

## 2013-08-08 LAB — GLUCOSE, CAPILLARY: Glucose-Capillary: 196 mg/dL — ABNORMAL HIGH (ref 70–99)

## 2013-08-08 LAB — TROPONIN I: Troponin I: <0.3 ng/mL (ref <0.30)

## 2013-08-08 NOTE — ED Notes (Signed)
Ambulated pt in the hallway; O2 sats stayed between 98% and 100%; pt states he feels "good"; denies being dizzy or weak at the moment

## 2013-08-08 NOTE — ED Provider Notes (Signed)
CSN: [id number]     Arrival date & time 08/08/13  1106 History  This chart was scribed for [name], MD by Zettie Pho, ED Scribe. This patient was seen in room APA05/APA05 and the patient's care was started at 12:51 PM.    Chief Complaint  Patient presents with  . Fatigue   The history is provided by the patient. No language interpreter was used.   HPI Comments: Jeffrey [name] is a 80 y.o. male who presents to the Emergency Department complaining of diffuse, generalized weakness onset this morning upon waking from sleep. He states that he has had normal appetite and has eaten today. Patient was seen here yesterday for abdominal pain and diarrhea, but states that these symptoms have since resolved. Patient reported he had not had a bowel movement for several days before his ED visit yesterday and he took a laxative after which he had diarrhea. He states he has since been taken medication for the diarrhea and now the diarrhea has resolved. Patient states that he followed up with his PCP earlier today for this and states that was advised to come to the ED for further evaluation of his weakness. He denies chest pain, shortness of breath, cough, sore throat, rhinorrhea, nausea, emesis, dysuria, frequency, fever, headache. Patient has a history of type II DM, hyperlipidemia, HTN, stroke, chronic back pain, and ischemic cardiomyopathy. Patient states that he has a cardiologist at [facility].   PCP- Dr. Redmond School  Cardiologist [place]  Past Medical History  Diagnosis Date  . Diabetes mellitus, type II   . Hyperlipidemia     Lipid profile in 02/2012:135, 227, 41, 49  . Coronary atherosclerosis of native coronary artery     a. CABG x 4 in 1989 (VG->OM1->OM2, VG->RCA, LIMA->LAD), b. 05/2010: DES to VG-OM1/OM2, DES to distal LCx. c. NSTEMI in 04/2011 - TO distal LCX stent and VG->OM2. d. 02/2012 NSTEMI DES to VG-OM1/continuation to OM2 occluded. e. inferior STEMI s/p DES to SVG-RAMUS 06/2012. f.  inferolat STEMI 09/2012 s/p DES to SVG-interm; g. Lex MV (11/14):  EF 35%, inf-lat scar with small peri-infarct ischemia  . Essential hypertension, benign   . Osteoarthritis   . History of stroke   . History of pneumonia   . Cervical vertebral fracture   . Chronic back pain   . Benign prostatic hypertrophy     History of urinary retention  . Peptic ulcer disease   . Gastroesophageal reflux disease   . Ischemic cardiomyopathy     LVEF 45-50%; Echo (04/2013):  mild LVH, EF 50%, Gr 1 DD, inf-lat HK, inf-septal and ant-lat HK, Tr MR, mild LAE   Past Surgical History  Procedure Laterality Date  . Tonsillectomy    . Coronary artery bypass graft     Family History  Problem Relation Age of Onset  . Early death      Parents died young  . Appendicitis Mother     Pt was 47 year old  . Heart attack Father 61   History  Substance Use Topics  . Smoking status: Former Smoker -- 2.00 packs/day for 10 years    Types: Cigarettes    Quit date: 07/14/1961  . Smokeless tobacco: Current User    Types: Chew  . Alcohol Use: No  lives at home Lives alone  Review of Systems  Constitutional: Negative for fever and appetite change.  HENT: Negative for rhinorrhea and sore throat.   Respiratory: Negative for cough and shortness of breath.  Cardiovascular: Negative for chest pain.  Gastrointestinal: Positive for abdominal pain (resolved) and diarrhea (resolved). Negative for nausea and vomiting.  Genitourinary: Negative for dysuria.  Neurological: Positive for weakness. Negative for headaches.  All other systems reviewed and are negative.   Allergies  Review of patient's allergies indicates no known allergies.  Home Medications   Current Outpatient Rx  Name  Route  Sig  Dispense  Refill  . albuterol (PROVENTIL HFA;VENTOLIN HFA) 108 (90 BASE) MCG/ACT inhaler   Inhalation   Inhale 2 puffs into the lungs every 6 (six) hours as needed for wheezing or shortness of breath.         Marland Kitchen aspirin  81 MG tablet   Oral   Take 1 tablet (81 mg total) by mouth daily.         Marland Kitchen atorvastatin (LIPITOR) 40 MG tablet   Oral   Take 1 tablet (40 mg total) by mouth daily at 6 PM.   30 tablet   11   . carvedilol (COREG) 3.125 MG tablet   Oral   Take 1 tablet (3.125 mg total) by mouth 2 (two) times daily with a meal.   60 tablet   3   . ferrous sulfate 325 (65 FE) MG tablet   Oral   Take 325 mg by mouth daily with breakfast.         . furosemide (LASIX) 20 MG tablet   Oral   Take 20 mg by mouth daily as needed for fluid. Take 1 tablet as needed if 3 lb weight gain.         Marland Kitchen loperamide (IMODIUM) 2 MG capsule   Oral   Take 2 mg by mouth as needed for diarrhea or loose stools.          . metFORMIN (GLUCOPHAGE) 500 MG tablet   Oral   Take 500 mg by mouth 2 (two) times daily with a meal.         . Multiple Vitamin (MULTIVITAMIN WITH MINERALS) TABS   Oral   Take 1 tablet by mouth daily.         . nitroGLYCERIN (NITROSTAT) 0.4 MG SL tablet   Sublingual   Place 1 tablet (0.4 mg total) under the tongue every 5 (five) minutes x 3 doses as needed for chest pain.   25 tablet   3   . pantoprazole (PROTONIX) 40 MG tablet   Oral   Take 1 tablet (40 mg total) by mouth daily.   30 tablet   0   . potassium chloride SA (K-DUR,KLOR-CON) 20 MEQ tablet   Oral   Take 20 mEq by mouth daily as needed (Takes only with Lasix).         . ranolazine (RANEXA) 500 MG 12 hr tablet   Oral   Take 1 tablet (500 mg total) by mouth 2 (two) times daily.   60 tablet   6   . sulfamethoxazole-trimethoprim (BACTRIM,SEPTRA) 400-80 MG per tablet   Oral   Take 2 tablets by mouth daily with supper.          . tamsulosin (FLOMAX) 0.4 MG CAPS   Oral   Take 0.4 mg by mouth daily after supper.          . Ticagrelor (BRILINTA) 90 MG TABS tablet   Oral   Take 1 tablet (90 mg total) by mouth 2 (two) times daily.   60 tablet   6    Triage Vitals: BP 110/69  Pulse  87  Temp(Src) 98.5 F  (36.9 C) (Oral)  Resp 16  Ht 5\' 10"  (1.778 m)  Wt 180 lb (81.647 kg)  BMI 25.83 kg/m2  SpO2 99%  Vital signs normal    Orthostatic VS normal with borderline BP of 100 on standing without change of pulse  Physical Exam  Nursing note and vitals reviewed. Constitutional: He is oriented to person, place, and time. He appears well-developed and well-nourished.  Non-toxic appearance. He does not appear ill. No distress.  HENT:  Head: Normocephalic and atraumatic.  Right Ear: External ear normal.  Left Ear: External ear normal.  Nose: Nose normal. No mucosal edema or rhinorrhea.  Mouth/Throat: Oropharynx is clear and moist and mucous membranes are normal. No dental abscesses or uvula swelling.  Eyes: Conjunctivae and EOM are normal. Pupils are equal, round, and reactive to light.  Neck: Normal range of motion and full passive range of motion without pain. Neck supple.  Cardiovascular: Normal rate, regular rhythm and normal heart sounds.  Exam reveals no gallop and no friction rub.   No murmur heard. Pulmonary/Chest: Effort normal and breath sounds normal. No respiratory distress. He has no wheezes. He has no rhonchi. He has no rales. He exhibits no tenderness and no crepitus.  Abdominal: Soft. Normal appearance and bowel sounds are normal. He exhibits no distension. There is no tenderness. There is no rebound and no guarding.  Musculoskeletal: Normal range of motion. He exhibits no edema and no tenderness.  Moves all extremities well.   Neurological: He is alert and oriented to person, place, and time. He has normal strength. No cranial nerve deficit.  Skin: Skin is warm, dry and intact. No rash noted. No erythema. No pallor.  Psychiatric: He has a normal mood and affect. His speech is normal and behavior is normal. His mood appears not anxious.    ED Course  Procedures (including critical care time)  DIAGNOSTIC STUDIES: Oxygen Saturation is 99% on room air, normal by my  interpretation.    COORDINATION OF CARE: 12:56 PM- Ordered blood labs and UA. Discussed treatment plan with patient at bedside and patient verbalized agreement.   The patient ambulated by nursing staff. His pulse ox remained 99-100%. He felt good without having dizziness or feeling weak. Patient states he felt better at discharge without specific treatment being done in the ED. Of note patient comes to the ED frequently. He has had 30 ED visits in the past year. Only 7 in the past 6 months.   At discharge pt is ready to go home, states he was going to leave and come back tomorrow if not discharged soon.    Results for orders placed during the hospital encounter of 08/08/13  GLUCOSE, CAPILLARY      Result Value Range   Glucose-Capillary 196 (*) 70 - 99 mg/dL   Comment 1 Documented in Chart    TROPONIN I      Result Value Range   Troponin I <0.30  <0.30 ng/mL  CBC WITH DIFFERENTIAL      Result Value Range   WBC 5.2  4.0 - 10.5 K/uL   RBC 4.10 (*) 4.22 - 5.81 MIL/uL   Hemoglobin 13.1  13.0 - 17.0 g/dL   HCT 38.6 (*) 39.0 - 52.0 %   MCV 94.1  78.0 - 100.0 fL   MCH 32.0  26.0 - 34.0 pg   MCHC 33.9  30.0 - 36.0 g/dL   RDW 14.0  11.5 - 15.5 %   Platelets  150  150 - 400 K/uL   Neutrophils Relative % 66  43 - 77 %   Neutro Abs 3.5  1.7 - 7.7 K/uL   Lymphocytes Relative 21  12 - 46 %   Lymphs Abs 1.1  0.7 - 4.0 K/uL   Monocytes Relative 8  3 - 12 %   Monocytes Absolute 0.4  0.1 - 1.0 K/uL   Eosinophils Relative 5  0 - 5 %   Eosinophils Absolute 0.2  0.0 - 0.7 K/uL   Basophils Relative 0  0 - 1 %   Basophils Absolute 0.0  0.0 - 0.1 K/uL  BASIC METABOLIC PANEL      Result Value Range   Sodium 135 (*) 137 - 147 mEq/L   Potassium 5.0  3.7 - 5.3 mEq/L   Chloride 101  96 - 112 mEq/L   CO2 22  19 - 32 mEq/L   Glucose, Bld 167 (*) 70 - 99 mg/dL   BUN 15  6 - 23 mg/dL   Creatinine, Ser 1.31  0.50 - 1.35 mg/dL   Calcium 9.3  8.4 - 10.5 mg/dL   GFR calc non Af Amer 49 (*) >90 mL/min    GFR calc Af Amer 57 (*) >90 mL/min  URINALYSIS, ROUTINE W REFLEX MICROSCOPIC      Result Value Range   Color, Urine YELLOW  YELLOW   APPearance CLEAR  CLEAR   Specific Gravity, Urine 1.025  1.005 - 1.030   pH 6.0  5.0 - 8.0   Glucose, UA 100 (*) NEGATIVE mg/dL   Hgb urine dipstick NEGATIVE  NEGATIVE   Bilirubin Urine NEGATIVE  NEGATIVE   Ketones, ur NEGATIVE  NEGATIVE mg/dL   Protein, ur NEGATIVE  NEGATIVE mg/dL   Urobilinogen, UA 0.2  0.0 - 1.0 mg/dL   Nitrite NEGATIVE  NEGATIVE   Leukocytes, UA NEGATIVE  NEGATIVE   Laboratory interpretation all normal except hyperglycemia     EKG Interpretation    Date/Time:  Monday August 08 2013 11:52:10 EST Ventricular Rate:  74 PR Interval:  180 QRS Duration: 98 QT Interval:  418 QTC Calculation: 463 R Axis:   80 Text Interpretation:  Normal sinus rhythm ST \\T \ T wave abnormality, consider inferolateral ischemia Prolonged QT When compared with ECG of 07-Aug-2013 13:49, Premature ventricular complexes are no longer Present Borderline criteria for Inferior infarct are no longer Present No significant change since last tracing Confirmed by Martez Weiand  MD-I, Rivka Baune ([id number]) on 08/08/2013 12:27:15 PM            MDM   1. Weakness    Plan discharge  Rolland Porter, MD, FACEP   I personally performed the services described in this documentation, which was scribed in my presence. The recorded information has been reviewed and considered.  Rolland Porter, MD, Abram Sander     [name], MD 08/08/13 [phone]

## 2013-08-08 NOTE — ED Notes (Signed)
Pt states generalized weakness. States he was here yesterday for abdominal pain and diarrhea. Went back to see Gerarda Fraction today and was told to go eat breakfast and come to ED so more tests could be performed to determine why pt is weak, per pt. Pt denies diarrhea and pain today.

## 2013-08-08 NOTE — Discharge Instructions (Signed)
Drink plenty of fluids. Eat a regular diet. Recheck as needed.

## 2013-08-08 NOTE — ED Notes (Signed)
Pt states "I'm just so weak that it is hard to walk". Pt has steady gait. Pt drove himself here from PMD's office. No neuro deficits noted.

## 2013-08-17 ENCOUNTER — Emergency Department (HOSPITAL_COMMUNITY): Payer: Medicare Other

## 2013-08-17 ENCOUNTER — Other Ambulatory Visit: Payer: Self-pay

## 2013-08-17 ENCOUNTER — Encounter (HOSPITAL_COMMUNITY): Payer: Self-pay | Admitting: Emergency Medicine

## 2013-08-17 ENCOUNTER — Emergency Department (HOSPITAL_COMMUNITY)
Admission: EM | Admit: 2013-08-17 | Discharge: 2013-08-18 | Disposition: A | Discharge status: Home / Self Care | Payer: Medicare Other | Attending: Emergency Medicine | Admitting: Emergency Medicine

## 2013-08-17 DIAGNOSIS — Z8673 Personal history of transient ischemic attack (TIA), and cerebral infarction without residual deficits: Secondary | ICD-10-CM | POA: Insufficient documentation

## 2013-08-17 DIAGNOSIS — G8929 Other chronic pain: Secondary | ICD-10-CM | POA: Insufficient documentation

## 2013-08-17 DIAGNOSIS — R079 Chest pain, unspecified: Secondary | ICD-10-CM

## 2013-08-17 DIAGNOSIS — Z7901 Long term (current) use of anticoagulants: Secondary | ICD-10-CM | POA: Insufficient documentation

## 2013-08-17 DIAGNOSIS — Z8701 Personal history of pneumonia (recurrent): Secondary | ICD-10-CM | POA: Insufficient documentation

## 2013-08-17 DIAGNOSIS — Z8781 Personal history of (healed) traumatic fracture: Secondary | ICD-10-CM | POA: Insufficient documentation

## 2013-08-17 DIAGNOSIS — R61 Generalized hyperhidrosis: Secondary | ICD-10-CM | POA: Insufficient documentation

## 2013-08-17 DIAGNOSIS — E785 Hyperlipidemia, unspecified: Secondary | ICD-10-CM | POA: Insufficient documentation

## 2013-08-17 DIAGNOSIS — Z79899 Other long term (current) drug therapy: Secondary | ICD-10-CM | POA: Insufficient documentation

## 2013-08-17 DIAGNOSIS — R0602 Shortness of breath: Secondary | ICD-10-CM | POA: Insufficient documentation

## 2013-08-17 DIAGNOSIS — I10 Essential (primary) hypertension: Secondary | ICD-10-CM | POA: Insufficient documentation

## 2013-08-17 DIAGNOSIS — Z792 Long term (current) use of antibiotics: Secondary | ICD-10-CM | POA: Insufficient documentation

## 2013-08-17 DIAGNOSIS — Z87891 Personal history of nicotine dependence: Secondary | ICD-10-CM | POA: Insufficient documentation

## 2013-08-17 DIAGNOSIS — Z951 Presence of aortocoronary bypass graft: Secondary | ICD-10-CM | POA: Insufficient documentation

## 2013-08-17 DIAGNOSIS — M1990 Unspecified osteoarthritis, unspecified site: Secondary | ICD-10-CM | POA: Insufficient documentation

## 2013-08-17 DIAGNOSIS — E119 Type 2 diabetes mellitus without complications: Secondary | ICD-10-CM | POA: Insufficient documentation

## 2013-08-17 DIAGNOSIS — I2510 Atherosclerotic heart disease of native coronary artery without angina pectoris: Secondary | ICD-10-CM | POA: Insufficient documentation

## 2013-08-17 DIAGNOSIS — K219 Gastro-esophageal reflux disease without esophagitis: Secondary | ICD-10-CM | POA: Insufficient documentation

## 2013-08-17 DIAGNOSIS — Z7982 Long term (current) use of aspirin: Secondary | ICD-10-CM | POA: Insufficient documentation

## 2013-08-17 DIAGNOSIS — R0789 Other chest pain: Secondary | ICD-10-CM | POA: Insufficient documentation

## 2013-08-17 LAB — BASIC METABOLIC PANEL
BUN: 16 mg/dL (ref 6–23)
CHLORIDE: 99 meq/L (ref 96–112)
CO2: 21 mEq/L (ref 19–32)
Calcium: 9.2 mg/dL (ref 8.4–10.5)
Creatinine, Ser: 1.17 mg/dL (ref 0.50–1.35)
GFR, EST AFRICAN AMERICAN: 65 mL/min — AB (ref >90)
GFR, EST NON AFRICAN AMERICAN: 56 mL/min — AB (ref >90)
Glucose, Bld: 184 mg/dL — ABNORMAL HIGH (ref 70–99)
Potassium: 4.7 mEq/L (ref 3.7–5.3)
Sodium: 133 mEq/L — ABNORMAL LOW (ref 137–147)

## 2013-08-17 LAB — CBC WITH DIFFERENTIAL/PLATELET
BASOS ABS: 0 10*3/uL (ref 0.0–0.1)
BASOS PCT: 1 % (ref 0–1)
EOS ABS: 0.3 10*3/uL (ref 0.0–0.7)
Eosinophils Relative: 6 % — ABNORMAL HIGH (ref 0–5)
HCT: 37.4 % — ABNORMAL LOW (ref 39.0–52.0)
HEMOGLOBIN: 12.8 g/dL — AB (ref 13.0–17.0)
Lymphocytes Relative: 22 % (ref 12–46)
Lymphs Abs: 1.1 10*3/uL (ref 0.7–4.0)
MCH: 32.7 pg (ref 26.0–34.0)
MCHC: 34.2 g/dL (ref 30.0–36.0)
MCV: 95.7 fL (ref 78.0–100.0)
MONOS PCT: 10 % (ref 3–12)
Monocytes Absolute: 0.5 10*3/uL (ref 0.1–1.0)
NEUTROS ABS: 3.1 10*3/uL (ref 1.7–7.7)
Neutrophils Relative %: 61 % (ref 43–77)
Platelets: 160 10*3/uL (ref 150–400)
RBC: 3.91 MIL/uL — ABNORMAL LOW (ref 4.22–5.81)
RDW: 13.8 % (ref 11.5–15.5)
WBC: 4.9 10*3/uL (ref 4.0–10.5)

## 2013-08-17 LAB — PROTIME-INR
INR: 0.92 (ref 0.00–1.49)
PROTHROMBIN TIME: 12.2 s (ref 11.6–15.2)

## 2013-08-17 LAB — POCT I-STAT TROPONIN I: TROPONIN I, POC: 0.05 ng/mL (ref 0.00–0.08)

## 2013-08-17 LAB — TROPONIN I: Troponin I: <0.3 ng/mL (ref <0.30)

## 2013-08-17 LAB — PRO B NATRIURETIC PEPTIDE: Pro B Natriuretic peptide (BNP): 1587 pg/mL — ABNORMAL HIGH (ref 0–450)

## 2013-08-17 MED ORDER — HYDROCODONE-ACETAMINOPHEN 5-325 MG PO TABS
1.0000 | ORAL_TABLET | Freq: Once | ORAL | Status: AC
  Administered 2013-08-17: 1 via ORAL
  Filled 2013-08-17: qty 1

## 2013-08-17 NOTE — ED Notes (Signed)
Pt states that his pain is better, states "it is pretty good right now"

## 2013-08-17 NOTE — ED Notes (Signed)
Pt resting with eyes closed, when Rn entered room, rates pain as a 2 on pain scale,

## 2013-08-17 NOTE — ED Notes (Addendum)
Pt c/o mid center, non radiating chest pain that pt describes as a pressure of an elephant sitting on his chest that started "before dark", pain is associated with sob and diaphoresis,  denies any n/v, radiation of pain, pt states that he two 325 mg asa when the pain started before dark with the pain going away, pt reports that the pain returned prior to arrival in er, did take one SL nitro tablet with improvement in chest pressure from a 10 on pain scale to a 3, pt arrives to er with pain at a 3, pt NSR on monitor, placed on oxygen at 2lpm via [place] for sob, pt reports eating pork and beans and a peach for supper,

## 2013-08-17 NOTE — ED Notes (Signed)
Pt c/o chest pain "that started before dark, I could hardly breath" Pt reports taking 3 ASA and 1 Nitro prior to leaving for the hospital. Reports pain has gone down from a 10 out of 10 to a 3 out of 10.

## 2013-08-17 NOTE — Discharge Instructions (Signed)
Follow up with dr. Gerarda Fraction in 1-2 days.  Return if any problems

## 2013-08-17 NOTE — ED Provider Notes (Signed)
CSN: [id number]     Arrival date & time 08/17/13  2006 History   This chart was scribed for Maudry Diego, MD by Era Bumpers, ED scribe. This patient was seen in room APA08/APA08 and the patient's care was started at 2030.  Chief Complaint  Patient presents with  . Chest Pain   Patient is a 80 y.o. male presenting with chest pain. The history is provided by the patient. No language interpreter was used.  Chest Pain Pain quality: pressure   Pain radiates to:  Does not radiate Pain severity:  Moderate Onset quality:  Sudden Progression:  Resolved Chronicity:  Recurrent Relieved by:  Nitroglycerin and aspirin Worsened by:  Nothing tried Ineffective treatments:  None tried Associated symptoms: diaphoresis and shortness of breath   Associated symptoms: no abdominal pain, no back pain, no cough, no fatigue and no headache   Risk factors: coronary artery disease    HPI Comments: CRISTON CHANCELLOR is a 80 y.o. male brought in by ambulance, who presents to the Emergency Department complaining of sudden onset, 10/10, non-radiating, mid chest pain, onset 3 hours ago. He took x3 ASA and x1 SL NTG while at home which he states improved his CP to 3/10 in pain. He reports CP is currently 3/10. He reports was was diaphoretic and SOB. Denies N/V. He was seen here 9 days ago for generalized weakness.   PCP Dr. Gerarda Fraction  Past Medical History  Diagnosis Date  . Diabetes mellitus, type II   . Hyperlipidemia     Lipid profile in 02/2012:135, 227, 41, 49  . Coronary atherosclerosis of native coronary artery     a. CABG x 4 in 1989 (VG->OM1->OM2, VG->RCA, LIMA->LAD), b. 05/2010: DES to VG-OM1/OM2, DES to distal LCx. c. NSTEMI in 04/2011 - TO distal LCX stent and VG->OM2. d. 02/2012 NSTEMI DES to VG-OM1/continuation to OM2 occluded. e. inferior STEMI s/p DES to SVG-RAMUS 06/2012. f. inferolat STEMI 09/2012 s/p DES to SVG-interm; g. Lex MV (11/14):  EF 35%, inf-lat scar with small peri-infarct ischemia  . Essential  hypertension, benign   . Osteoarthritis   . History of stroke   . History of pneumonia   . Cervical vertebral fracture   . Chronic back pain   . Benign prostatic hypertrophy     History of urinary retention  . Peptic ulcer disease   . Gastroesophageal reflux disease   . Ischemic cardiomyopathy     LVEF 45-50%; Echo (04/2013):  mild LVH, EF 50%, Gr 1 DD, inf-lat HK, inf-septal and ant-lat HK, Tr MR, mild LAE   Past Surgical History  Procedure Laterality Date  . Tonsillectomy    . Coronary artery bypass graft     Family History  Problem Relation Age of Onset  . Early death      Parents died young  . Appendicitis Mother     Pt was 47 year old  . Heart attack Father 44   History  Substance Use Topics  . Smoking status: Former Smoker -- 2.00 packs/day for 10 years    Types: Cigarettes    Quit date: 07/14/1961  . Smokeless tobacco: Current User    Types: Chew  . Alcohol Use: No    Review of Systems  Constitutional: Positive for diaphoresis. Negative for appetite change and fatigue.  HENT: Negative for congestion, ear discharge and sinus pressure.   Eyes: Negative for discharge.  Respiratory: Positive for shortness of breath. Negative for cough.   Cardiovascular: Positive for chest pain.  Gastrointestinal: Negative for abdominal pain and diarrhea.  Genitourinary: Negative for frequency and hematuria.  Musculoskeletal: Negative for back pain.  Skin: Negative for rash.  Neurological: Negative for seizures and headaches.  Psychiatric/Behavioral: Negative for hallucinations.  All other systems reviewed and are negative.   Allergies  Review of patient's allergies indicates no known allergies.  Home Medications   Current Outpatient Rx  Name  Route  Sig  Dispense  Refill  . albuterol (PROVENTIL HFA;VENTOLIN HFA) 108 (90 BASE) MCG/ACT inhaler   Inhalation   Inhale 2 puffs into the lungs every 6 (six) hours as needed for wheezing or shortness of breath.         Marland Kitchen  aspirin 81 MG tablet   Oral   Take 1 tablet (81 mg total) by mouth daily.         Marland Kitchen atorvastatin (LIPITOR) 40 MG tablet   Oral   Take 1 tablet (40 mg total) by mouth daily at 6 PM.   30 tablet   11   . carvedilol (COREG) 3.125 MG tablet   Oral   Take 1 tablet (3.125 mg total) by mouth 2 (two) times daily with a meal.   60 tablet   3   . ferrous sulfate 325 (65 FE) MG tablet   Oral   Take 325 mg by mouth daily with breakfast.         . furosemide (LASIX) 20 MG tablet   Oral   Take 20 mg by mouth daily as needed for fluid. Take 1 tablet as needed if 3 lb weight gain.         Marland Kitchen loperamide (IMODIUM) 2 MG capsule   Oral   Take 2 mg by mouth as needed for diarrhea or loose stools.          . metFORMIN (GLUCOPHAGE) 500 MG tablet   Oral   Take 500 mg by mouth 2 (two) times daily with a meal.         . Multiple Vitamin (MULTIVITAMIN WITH MINERALS) TABS   Oral   Take 1 tablet by mouth daily.         . nitroGLYCERIN (NITROSTAT) 0.4 MG SL tablet   Sublingual   Place 1 tablet (0.4 mg total) under the tongue every 5 (five) minutes x 3 doses as needed for chest pain.   25 tablet   3   . pantoprazole (PROTONIX) 40 MG tablet   Oral   Take 1 tablet (40 mg total) by mouth daily.   30 tablet   0   . potassium chloride SA (K-DUR,KLOR-CON) 20 MEQ tablet   Oral   Take 20 mEq by mouth daily as needed (Takes only with Lasix).         . ranolazine (RANEXA) 500 MG 12 hr tablet   Oral   Take 1 tablet (500 mg total) by mouth 2 (two) times daily.   60 tablet   6   . sulfamethoxazole-trimethoprim (BACTRIM,SEPTRA) 400-80 MG per tablet   Oral   Take 2 tablets by mouth daily with supper.          . tamsulosin (FLOMAX) 0.4 MG CAPS   Oral   Take 0.4 mg by mouth daily after supper.          . Ticagrelor (BRILINTA) 90 MG TABS tablet   Oral   Take 1 tablet (90 mg total) by mouth 2 (two) times daily.   60 tablet   6    Triage Vitals:  BP 127/68  Pulse 77  Temp(Src)  97.8 F (36.6 C) (Oral)  Resp 16  Ht 5\' 10"  (1.778 m)  Wt 180 lb (81.647 kg)  BMI 25.83 kg/m2  SpO2 98%  Physical Exam  Nursing note and vitals reviewed. Constitutional: He is oriented to person, place, and time. He appears well-developed and well-nourished. No distress.  HENT:  Head: Normocephalic and atraumatic.  Eyes: Conjunctivae are normal. Right eye exhibits no discharge. Left eye exhibits no discharge.  Neck: Normal range of motion.  Cardiovascular: Normal rate, regular rhythm and normal heart sounds.   No murmur heard. Pulmonary/Chest: Effort normal and breath sounds normal. No respiratory distress. He has no wheezes. He has no rales.  Musculoskeletal: Normal range of motion. He exhibits no edema.  Neurological: He is alert and oriented to person, place, and time.  Skin: Skin is warm and dry.  Well healed scar over mid chest  Psychiatric: He has a normal mood and affect. Thought content normal.   ED Course  Procedures (including critical care time) DIAGNOSTIC STUDIES: Oxygen Saturation is 98% on room air, normal by my interpretation.    COORDINATION OF CARE: At 2048 Discussed treatment plan with patient which includes blood work, cardiac monitoring, CXR, EKG, cardiac enzymes. Patient agrees.   Labs Review Labs Reviewed  CBC WITH DIFFERENTIAL  BASIC METABOLIC PANEL  PROTIME-INR  PRO B NATRIURETIC PEPTIDE  POCT I-STAT TROPONIN I   Imaging Review Dg Chest Portable 1 View  08/17/2013   CLINICAL DATA:  Chest pain.  Diabetes.  Ischemic cardiomyopathy.  EXAM: PORTABLE CHEST - 1 VIEW  COMPARISON:  DG CHEST 2 VIEW dated 06/16/2013; DG CHEST 1V PORT dated 05/28/2013; DG CHEST 2 VIEW dated 05/21/2013; CT ANGIO CHEST W/CM &/OR WO/CM dated 05/23/2011  FINDINGS: The cardiomegaly noted with prior CABG. Prominent upper zone pulmonary vasculature suggest pulmonary venous hypertension. No overt edema. No pleural effusion.  Atherosclerotic calcification of the aortic arch.  IMPRESSION: 1.  Cardiomegaly with pulmonary venous hypertension, but without overt edema.   Electronically Signed   By: Sherryl Barters M.D.   On: 08/17/2013 20:58    EKG Interpretation    Date/Time:  Wednesday August 17 2013 20:13:19 EST Ventricular Rate:  81 PR Interval:  196 QRS Duration: 102 QT Interval:  392 QTC Calculation: 455 R Axis:   87 Text Interpretation:  Normal sinus rhythm ST \\T \ T wave abnormality, consider lateral ischemia Abnormal ECG When compared with ECG of 08-Aug-2013 11:52, ST now depressed in Anterior leads Sinus rhythm ST-t wave abnormality No significant change since last tracing Abnormal ekg Confirmed by Carmin Muskrat  MD ([id number]) on 08/17/2013 8:19:52 PM          pt with nl troponin x 2,  2nd ekg shows no st depression.  Pt states he has no pain and wants to go home.  Pt to see pcp in 1-2 days MDM  Chest pain The chart was scribed for me under my direct supervision.  I personally performed the history, physical, and medical decision making and all procedures in the evaluation of this patient.Maudry Diego, MD 08/17/13 [phone]

## 2013-08-17 NOTE — ED Notes (Signed)
Dr Zammit at bedside,  

## 2013-08-18 ENCOUNTER — Emergency Department (HOSPITAL_COMMUNITY)
Admission: EM | Admit: 2013-08-18 | Discharge: 2013-08-18 | Disposition: A | Discharge status: Home / Self Care | Payer: Medicare Other | Attending: Emergency Medicine | Admitting: Emergency Medicine

## 2013-08-18 ENCOUNTER — Encounter (HOSPITAL_COMMUNITY): Payer: Self-pay | Admitting: Emergency Medicine

## 2013-08-18 DIAGNOSIS — G8929 Other chronic pain: Secondary | ICD-10-CM | POA: Insufficient documentation

## 2013-08-18 DIAGNOSIS — IMO0002 Reserved for concepts with insufficient information to code with codable children: Secondary | ICD-10-CM | POA: Insufficient documentation

## 2013-08-18 DIAGNOSIS — Y838 Other surgical procedures as the cause of abnormal reaction of the patient, or of later complication, without mention of misadventure at the time of the procedure: Secondary | ICD-10-CM | POA: Insufficient documentation

## 2013-08-18 DIAGNOSIS — I10 Essential (primary) hypertension: Secondary | ICD-10-CM | POA: Insufficient documentation

## 2013-08-18 DIAGNOSIS — Z8711 Personal history of peptic ulcer disease: Secondary | ICD-10-CM | POA: Insufficient documentation

## 2013-08-18 DIAGNOSIS — I2510 Atherosclerotic heart disease of native coronary artery without angina pectoris: Secondary | ICD-10-CM | POA: Insufficient documentation

## 2013-08-18 DIAGNOSIS — Z8781 Personal history of (healed) traumatic fracture: Secondary | ICD-10-CM | POA: Insufficient documentation

## 2013-08-18 DIAGNOSIS — Z8701 Personal history of pneumonia (recurrent): Secondary | ICD-10-CM | POA: Insufficient documentation

## 2013-08-18 DIAGNOSIS — Z79899 Other long term (current) drug therapy: Secondary | ICD-10-CM | POA: Insufficient documentation

## 2013-08-18 DIAGNOSIS — K219 Gastro-esophageal reflux disease without esophagitis: Secondary | ICD-10-CM | POA: Insufficient documentation

## 2013-08-18 DIAGNOSIS — Z8673 Personal history of transient ischemic attack (TIA), and cerebral infarction without residual deficits: Secondary | ICD-10-CM | POA: Insufficient documentation

## 2013-08-18 DIAGNOSIS — N400 Enlarged prostate without lower urinary tract symptoms: Secondary | ICD-10-CM | POA: Insufficient documentation

## 2013-08-18 DIAGNOSIS — Z7901 Long term (current) use of anticoagulants: Secondary | ICD-10-CM | POA: Insufficient documentation

## 2013-08-18 DIAGNOSIS — E785 Hyperlipidemia, unspecified: Secondary | ICD-10-CM | POA: Insufficient documentation

## 2013-08-18 DIAGNOSIS — E119 Type 2 diabetes mellitus without complications: Secondary | ICD-10-CM | POA: Insufficient documentation

## 2013-08-18 DIAGNOSIS — M1990 Unspecified osteoarthritis, unspecified site: Secondary | ICD-10-CM | POA: Insufficient documentation

## 2013-08-18 DIAGNOSIS — Z87891 Personal history of nicotine dependence: Secondary | ICD-10-CM | POA: Insufficient documentation

## 2013-08-18 DIAGNOSIS — Z792 Long term (current) use of antibiotics: Secondary | ICD-10-CM | POA: Insufficient documentation

## 2013-08-18 DIAGNOSIS — Z7982 Long term (current) use of aspirin: Secondary | ICD-10-CM | POA: Insufficient documentation

## 2013-08-18 DIAGNOSIS — R58 Hemorrhage, not elsewhere classified: Secondary | ICD-10-CM

## 2013-08-18 DIAGNOSIS — Z951 Presence of aortocoronary bypass graft: Secondary | ICD-10-CM | POA: Insufficient documentation

## 2013-08-18 NOTE — ED Notes (Signed)
MD at bedside. No active bleeding noted at this time. Dermabond placed on site.

## 2013-08-18 NOTE — ED Notes (Signed)
Pt. Was seen earlier this shift for chest pain. Pt. Denies chest pain at this time. Pt. Reports that he is on blood thinners and he has been unable to control bleeding from IV site.

## 2013-08-18 NOTE — ED Provider Notes (Signed)
CSN: [id number]     Arrival date & time 08/18/13  0535 History   First MD Initiated Contact with Patient 08/18/13 [phone]     Chief Complaint  Patient presents with  . bleeding on arm     HPI Patient was seen earlier in the evening for chest pain.  He had a IV started in his left forearm.  He returns today because he states there was still some bleeding when he awoke this morning from his left forearm.  The patient takes Brilinta given his history of significant coronary artery disease status post multiple PCI's.  He denies any chest pain this time.  No shortness of breath.  He is following up with his primary care physician.  Seen yesterday in the ER for chest pain.  Negative for crypto was completed at that time.  The patient states his no longer with any chest discomfort.   Past Medical History  Diagnosis Date  . Diabetes mellitus, type II   . Hyperlipidemia     Lipid profile in 02/2012:135, 227, 41, 49  . Coronary atherosclerosis of native coronary artery     a. CABG x 4 in 1989 (VG->OM1->OM2, VG->RCA, LIMA->LAD), b. 05/2010: DES to VG-OM1/OM2, DES to distal LCx. c. NSTEMI in 04/2011 - TO distal LCX stent and VG->OM2. d. 02/2012 NSTEMI DES to VG-OM1/continuation to OM2 occluded. e. inferior STEMI s/p DES to SVG-RAMUS 06/2012. f. inferolat STEMI 09/2012 s/p DES to SVG-interm; g. Lex MV (11/14):  EF 35%, inf-lat scar with small peri-infarct ischemia  . Essential hypertension, benign   . Osteoarthritis   . History of stroke   . History of pneumonia   . Cervical vertebral fracture   . Chronic back pain   . Benign prostatic hypertrophy     History of urinary retention  . Peptic ulcer disease   . Gastroesophageal reflux disease   . Ischemic cardiomyopathy     LVEF 45-50%; Echo (04/2013):  mild LVH, EF 50%, Gr 1 DD, inf-lat HK, inf-septal and ant-lat HK, Tr MR, mild LAE   Past Surgical History  Procedure Laterality Date  . Tonsillectomy    . Coronary artery bypass graft     Family History   Problem Relation Age of Onset  . Early death      Parents died young  . Appendicitis Mother     Pt was 31 year old  . Heart attack Father 27   History  Substance Use Topics  . Smoking status: Former Smoker -- 2.00 packs/day for 10 years    Types: Cigarettes    Quit date: 07/14/1961  . Smokeless tobacco: Current User    Types: Chew  . Alcohol Use: No    Review of Systems  All other systems reviewed and are negative.    Allergies  Review of patient's allergies indicates no known allergies.  Home Medications   Current Outpatient Rx  Name  Route  Sig  Dispense  Refill  . albuterol (PROVENTIL HFA;VENTOLIN HFA) 108 (90 BASE) MCG/ACT inhaler   Inhalation   Inhale 2 puffs into the lungs every 6 (six) hours as needed for wheezing or shortness of breath.         Marland Kitchen aspirin 81 MG tablet   Oral   Take 1 tablet (81 mg total) by mouth daily.         Marland Kitchen atorvastatin (LIPITOR) 40 MG tablet   Oral   Take 1 tablet (40 mg total) by mouth daily at 6 PM.  30 tablet   11   . carvedilol (COREG) 3.125 MG tablet   Oral   Take 1 tablet (3.125 mg total) by mouth 2 (two) times daily with a meal.   60 tablet   3   . ferrous sulfate 325 (65 FE) MG tablet   Oral   Take 325 mg by mouth daily with breakfast.         . furosemide (LASIX) 20 MG tablet   Oral   Take 20 mg by mouth daily as needed for fluid. Take 1 tablet as needed if 3 lb weight gain.         Marland Kitchen loperamide (IMODIUM) 2 MG capsule   Oral   Take 2 mg by mouth as needed for diarrhea or loose stools.          . metFORMIN (GLUCOPHAGE) 500 MG tablet   Oral   Take 500 mg by mouth 2 (two) times daily with a meal.         . Multiple Vitamin (MULTIVITAMIN WITH MINERALS) TABS   Oral   Take 1 tablet by mouth daily.         . nitroGLYCERIN (NITROSTAT) 0.4 MG SL tablet   Sublingual   Place 1 tablet (0.4 mg total) under the tongue every 5 (five) minutes x 3 doses as needed for chest pain.   25 tablet   3   .  pantoprazole (PROTONIX) 40 MG tablet   Oral   Take 1 tablet (40 mg total) by mouth daily.   30 tablet   0   . potassium chloride SA (K-DUR,KLOR-CON) 20 MEQ tablet   Oral   Take 20 mEq by mouth daily as needed (Takes only with Lasix).         . ranolazine (RANEXA) 500 MG 12 hr tablet   Oral   Take 1 tablet (500 mg total) by mouth 2 (two) times daily.   60 tablet   6   . sulfamethoxazole-trimethoprim (BACTRIM,SEPTRA) 400-80 MG per tablet   Oral   Take 2 tablets by mouth daily with supper.          . tamsulosin (FLOMAX) 0.4 MG CAPS   Oral   Take 0.4 mg by mouth daily after supper.          . Ticagrelor (BRILINTA) 90 MG TABS tablet   Oral   Take 1 tablet (90 mg total) by mouth 2 (two) times daily.   60 tablet   6    BP 141/75  Pulse 80  Temp(Src) 97.7 F (36.5 C) (Oral)  Resp 20  Ht 5\' 10"  (1.778 m)  Wt 180 lb (81.647 kg)  BMI 25.83 kg/m2  SpO2 99% Physical Exam  Nursing note and vitals reviewed. Constitutional: He is oriented to person, place, and time. He appears well-developed and well-nourished.  HENT:  Head: Normocephalic.  Eyes: EOM are normal.  Neck: Normal range of motion.  Pulmonary/Chest: Effort normal.  Abdominal: He exhibits no distension.  Musculoskeletal: Normal range of motion.  No active bleeding from the venipuncture site at his left posterior distal third forearm.  Neurological: He is alert and oriented to person, place, and time.  Psychiatric: He has a normal mood and affect.    ED Course  Procedures (including critical care time) Labs Review Labs Reviewed - No data to display Imaging Review Dg Chest Portable 1 View  08/17/2013   CLINICAL DATA:  Chest pain.  Diabetes.  Ischemic cardiomyopathy.  EXAM: PORTABLE CHEST - 1  VIEW  COMPARISON:  DG CHEST 2 VIEW dated 06/16/2013; DG CHEST 1V PORT dated 05/28/2013; DG CHEST 2 VIEW dated 05/21/2013; CT ANGIO CHEST W/CM &/OR WO/CM dated 05/23/2011  FINDINGS: The cardiomegaly noted with prior CABG.  Prominent upper zone pulmonary vasculature suggest pulmonary venous hypertension. No overt edema. No pleural effusion.  Atherosclerotic calcification of the aortic arch.  IMPRESSION: 1. Cardiomegaly with pulmonary venous hypertension, but without overt edema.   Electronically Signed   By: Sherryl Barters M.D.   On: 08/17/2013 20:58  I personally reviewed the imaging tests through PACS system I reviewed available ER/hospitalization records through the EMR   EKG Interpretation   None       MDM   1. Bleeding from venipuncture site    Bleeding controlled at this time.  A small amount of Dermabond was placed over the venipuncture site so that it doesn't return to bleeding.  No active chest pain.  Patient only came to have his arm evaluated.  He states he is following up with his primary care physician and does not need anything else done at this time.    Hoy Morn, MD 08/18/13 0600

## 2013-08-26 ENCOUNTER — Encounter (HOSPITAL_COMMUNITY): Payer: Self-pay | Admitting: Emergency Medicine

## 2013-08-26 ENCOUNTER — Inpatient Hospital Stay (HOSPITAL_COMMUNITY)
Admission: EM | Admit: 2013-08-26 | Discharge: 2013-09-01 | DRG: 371 | Disposition: A | Discharge status: Skilled Nursing Facility | Payer: Medicare Other | Attending: Family Medicine | Admitting: Family Medicine

## 2013-08-26 ENCOUNTER — Emergency Department (HOSPITAL_COMMUNITY): Payer: Medicare Other

## 2013-08-26 DIAGNOSIS — R0602 Shortness of breath: Secondary | ICD-10-CM

## 2013-08-26 DIAGNOSIS — R079 Chest pain, unspecified: Secondary | ICD-10-CM | POA: Diagnosis present

## 2013-08-26 DIAGNOSIS — Z8701 Personal history of pneumonia (recurrent): Secondary | ICD-10-CM

## 2013-08-26 DIAGNOSIS — I10 Essential (primary) hypertension: Secondary | ICD-10-CM | POA: Diagnosis present

## 2013-08-26 DIAGNOSIS — Z8249 Family history of ischemic heart disease and other diseases of the circulatory system: Secondary | ICD-10-CM

## 2013-08-26 DIAGNOSIS — Z87891 Personal history of nicotine dependence: Secondary | ICD-10-CM

## 2013-08-26 DIAGNOSIS — Z951 Presence of aortocoronary bypass graft: Secondary | ICD-10-CM

## 2013-08-26 DIAGNOSIS — M1990 Unspecified osteoarthritis, unspecified site: Secondary | ICD-10-CM | POA: Diagnosis present

## 2013-08-26 DIAGNOSIS — D696 Thrombocytopenia, unspecified: Secondary | ICD-10-CM | POA: Diagnosis present

## 2013-08-26 DIAGNOSIS — R112 Nausea with vomiting, unspecified: Secondary | ICD-10-CM | POA: Diagnosis present

## 2013-08-26 DIAGNOSIS — K219 Gastro-esophageal reflux disease without esophagitis: Secondary | ICD-10-CM | POA: Diagnosis present

## 2013-08-26 DIAGNOSIS — N400 Enlarged prostate without lower urinary tract symptoms: Secondary | ICD-10-CM | POA: Diagnosis present

## 2013-08-26 DIAGNOSIS — E860 Dehydration: Secondary | ICD-10-CM | POA: Diagnosis present

## 2013-08-26 DIAGNOSIS — I2789 Other specified pulmonary heart diseases: Secondary | ICD-10-CM | POA: Diagnosis present

## 2013-08-26 DIAGNOSIS — E785 Hyperlipidemia, unspecified: Secondary | ICD-10-CM | POA: Diagnosis present

## 2013-08-26 DIAGNOSIS — Z7982 Long term (current) use of aspirin: Secondary | ICD-10-CM

## 2013-08-26 DIAGNOSIS — I2589 Other forms of chronic ischemic heart disease: Secondary | ICD-10-CM | POA: Diagnosis present

## 2013-08-26 DIAGNOSIS — I252 Old myocardial infarction: Secondary | ICD-10-CM

## 2013-08-26 DIAGNOSIS — G8929 Other chronic pain: Secondary | ICD-10-CM | POA: Diagnosis present

## 2013-08-26 DIAGNOSIS — M549 Dorsalgia, unspecified: Secondary | ICD-10-CM | POA: Diagnosis present

## 2013-08-26 DIAGNOSIS — R197 Diarrhea, unspecified: Secondary | ICD-10-CM | POA: Diagnosis present

## 2013-08-26 DIAGNOSIS — I25810 Atherosclerosis of coronary artery bypass graft(s) without angina pectoris: Secondary | ICD-10-CM

## 2013-08-26 DIAGNOSIS — E119 Type 2 diabetes mellitus without complications: Secondary | ICD-10-CM | POA: Diagnosis present

## 2013-08-26 DIAGNOSIS — Z9119 Patient's noncompliance with other medical treatment and regimen: Secondary | ICD-10-CM

## 2013-08-26 DIAGNOSIS — R072 Precordial pain: Secondary | ICD-10-CM

## 2013-08-26 DIAGNOSIS — J40 Bronchitis, not specified as acute or chronic: Secondary | ICD-10-CM | POA: Diagnosis present

## 2013-08-26 DIAGNOSIS — Z91199 Patient's noncompliance with other medical treatment and regimen due to unspecified reason: Secondary | ICD-10-CM

## 2013-08-26 DIAGNOSIS — D649 Anemia, unspecified: Secondary | ICD-10-CM

## 2013-08-26 DIAGNOSIS — A0472 Enterocolitis due to Clostridium difficile, not specified as recurrent: Principal | ICD-10-CM | POA: Diagnosis present

## 2013-08-26 DIAGNOSIS — I214 Non-ST elevation (NSTEMI) myocardial infarction: Secondary | ICD-10-CM

## 2013-08-26 DIAGNOSIS — I959 Hypotension, unspecified: Secondary | ICD-10-CM | POA: Diagnosis present

## 2013-08-26 DIAGNOSIS — R008 Other abnormalities of heart beat: Secondary | ICD-10-CM

## 2013-08-26 DIAGNOSIS — Z8673 Personal history of transient ischemic attack (TIA), and cerebral infarction without residual deficits: Secondary | ICD-10-CM

## 2013-08-26 DIAGNOSIS — I2510 Atherosclerotic heart disease of native coronary artery without angina pectoris: Secondary | ICD-10-CM | POA: Diagnosis present

## 2013-08-26 DIAGNOSIS — I255 Ischemic cardiomyopathy: Secondary | ICD-10-CM

## 2013-08-26 LAB — COMPREHENSIVE METABOLIC PANEL
ALT: 24 U/L (ref 0–53)
AST: 23 U/L (ref 0–37)
Albumin: 4.1 g/dL (ref 3.5–5.2)
Alkaline Phosphatase: 78 U/L (ref 39–117)
BUN: 22 mg/dL (ref 6–23)
CALCIUM: 9.1 mg/dL (ref 8.4–10.5)
CO2: 22 meq/L (ref 19–32)
Chloride: 98 mEq/L (ref 96–112)
Creatinine, Ser: 1.21 mg/dL (ref 0.50–1.35)
GFR calc non Af Amer: 54 mL/min — ABNORMAL LOW (ref >90)
GFR, EST AFRICAN AMERICAN: 63 mL/min — AB (ref >90)
GLUCOSE: 204 mg/dL — AB (ref 70–99)
Potassium: 4.6 mEq/L (ref 3.7–5.3)
SODIUM: 134 meq/L — AB (ref 137–147)
TOTAL PROTEIN: 7.3 g/dL (ref 6.0–8.3)
Total Bilirubin: 0.3 mg/dL (ref 0.3–1.2)

## 2013-08-26 LAB — CG4 I-STAT (LACTIC ACID): LACTIC ACID, VENOUS: 1.4 mmol/L (ref 0.5–2.2)

## 2013-08-26 LAB — CBC WITH DIFFERENTIAL/PLATELET
Basophils Absolute: 0 10*3/uL (ref 0.0–0.1)
Basophils Relative: 0 % (ref 0–1)
EOS PCT: 2 % (ref 0–5)
Eosinophils Absolute: 0.2 10*3/uL (ref 0.0–0.7)
HEMATOCRIT: 41.6 % (ref 39.0–52.0)
Hemoglobin: 14.4 g/dL (ref 13.0–17.0)
LYMPHS ABS: 0.4 10*3/uL — AB (ref 0.7–4.0)
LYMPHS PCT: 3 % — AB (ref 12–46)
MCH: 33.6 pg (ref 26.0–34.0)
MCHC: 34.6 g/dL (ref 30.0–36.0)
MCV: 97 fL (ref 78.0–100.0)
Monocytes Absolute: 0.6 10*3/uL (ref 0.1–1.0)
Monocytes Relative: 5 % (ref 3–12)
Neutro Abs: 10.1 10*3/uL — ABNORMAL HIGH (ref 1.7–7.7)
Neutrophils Relative %: 90 % — ABNORMAL HIGH (ref 43–77)
Platelets: 151 10*3/uL (ref 150–400)
RBC: 4.29 MIL/uL (ref 4.22–5.81)
RDW: 13.7 % (ref 11.5–15.5)
WBC: 11.2 10*3/uL — AB (ref 4.0–10.5)

## 2013-08-26 LAB — LIPASE, BLOOD: Lipase: 36 U/L (ref 11–59)

## 2013-08-26 LAB — TROPONIN I

## 2013-08-26 MED ORDER — SODIUM CHLORIDE 0.9 % IV BOLUS (SEPSIS)
500.0000 mL | Freq: Once | INTRAVENOUS | Status: AC
  Administered 2013-08-26: 500 mL via INTRAVENOUS

## 2013-08-26 MED ORDER — ALUM & MAG HYDROXIDE-SIMETH 200-200-20 MG/5ML PO SUSP
15.0000 mL | Freq: Once | ORAL | Status: AC
  Administered 2013-08-26: 15 mL via ORAL
  Filled 2013-08-26: qty 30

## 2013-08-26 MED ORDER — ONDANSETRON HCL 4 MG/2ML IJ SOLN
4.0000 mg | Freq: Once | INTRAMUSCULAR | Status: AC
  Administered 2013-08-26: 4 mg via INTRAVENOUS
  Filled 2013-08-26: qty 2

## 2013-08-26 MED ORDER — ONDANSETRON HCL 4 MG/2ML IJ SOLN: 4.0000 mg | Freq: Once | INTRAMUSCULAR | Status: DC

## 2013-08-26 NOTE — ED Provider Notes (Signed)
CSN: [id number]     Arrival date & time 08/26/13  2150 History   This chart was scribed for Carmin Muskrat, MD by Era Bumpers, ED scribe. This patient was seen in room APA05/APA05 and the patient's care was started at 2150.  Chief Complaint  Patient presents with  . Chest Pain  . Abdominal Pain  . Emesis   The history is provided by the patient. No language interpreter was used.   HPI Comments: Jeffrey [name] is a 80 y.o. male who presents to the Emergency Department complaining of constant nausea and emesis episodes 3 hours ago. Was doing fine before hand except for some mild SOB over the past 6 months. Until today has been doing fine. Denies any new activities or new meals/foods. He reports some associated mild abdominal pain which has since resolved. He regularly takes stool softener which he took tonight. Denies CP, neck pain, diarrhea, confusion, disorientation. Denies any sick contacts.   CVA last year - taking medicine regularly  Hs of DM  Past Medical History  Diagnosis Date  . Diabetes mellitus, type II   . Hyperlipidemia     Lipid profile in 02/2012:135, 227, 41, 49  . Coronary atherosclerosis of native coronary artery     a. CABG x 4 in 1989 (VG->OM1->OM2, VG->RCA, LIMA->LAD), b. 05/2010: DES to VG-OM1/OM2, DES to distal LCx. c. NSTEMI in 04/2011 - TO distal LCX stent and VG->OM2. d. 02/2012 NSTEMI DES to VG-OM1/continuation to OM2 occluded. e. inferior STEMI s/p DES to SVG-RAMUS 06/2012. f. inferolat STEMI 09/2012 s/p DES to SVG-interm; g. Lex MV (11/14):  EF 35%, inf-lat scar with small peri-infarct ischemia  . Essential hypertension, benign   . Osteoarthritis   . History of stroke   . History of pneumonia   . Cervical vertebral fracture   . Chronic back pain   . Benign prostatic hypertrophy     History of urinary retention  . Peptic ulcer disease   . Gastroesophageal reflux disease   . Ischemic cardiomyopathy     LVEF 45-50%; Echo (04/2013):  mild LVH, EF 50%, Gr 1 DD,  inf-lat HK, inf-septal and ant-lat HK, Tr MR, mild LAE   Past Surgical History  Procedure Laterality Date  . Tonsillectomy    . Coronary artery bypass graft     Family History  Problem Relation Age of Onset  . Early death      Parents died young  . Appendicitis Mother     Pt was 39 year old  . Heart attack Father 58   History  Substance Use Topics  . Smoking status: Former Smoker -- 2.00 packs/day for 10 years    Types: Cigarettes    Quit date: 07/14/1961  . Smokeless tobacco: Current User    Types: Chew  . Alcohol Use: No    Review of Systems  Constitutional: Negative for fever and chills.       Per HPI, otherwise negative  HENT:       Per HPI, otherwise negative  Respiratory: Negative for shortness of breath.        Per HPI, otherwise negative  Cardiovascular: Positive for leg swelling (ankle swelling). Negative for chest pain.       Per HPI, otherwise negative  Gastrointestinal: Positive for nausea and vomiting. Negative for abdominal pain.  Endocrine:       Negative aside from HPI  Genitourinary:       Neg aside from HPI   Musculoskeletal:  Per HPI, otherwise negative  Skin: Negative.   Neurological: Negative for syncope.  All other systems reviewed and are negative.   Allergies  Review of patient's allergies indicates no known allergies.  Home Medications   Current Outpatient Rx  Name  Route  Sig  Dispense  Refill  . albuterol (PROVENTIL HFA;VENTOLIN HFA) 108 (90 BASE) MCG/ACT inhaler   Inhalation   Inhale 2 puffs into the lungs every 6 (six) hours as needed for wheezing or shortness of breath.         Marland Kitchen aspirin 81 MG tablet   Oral   Take 1 tablet (81 mg total) by mouth daily.         Marland Kitchen atorvastatin (LIPITOR) 40 MG tablet   Oral   Take 1 tablet (40 mg total) by mouth daily at 6 PM.   30 tablet   11   . carvedilol (COREG) 3.125 MG tablet   Oral   Take 1 tablet (3.125 mg total) by mouth 2 (two) times daily with a meal.   60 tablet    3   . ferrous sulfate 325 (65 FE) MG tablet   Oral   Take 325 mg by mouth daily with breakfast.         . furosemide (LASIX) 20 MG tablet   Oral   Take 20 mg by mouth daily as needed for fluid. Take 1 tablet as needed if 3 lb weight gain.         Marland Kitchen loperamide (IMODIUM) 2 MG capsule   Oral   Take 2 mg by mouth as needed for diarrhea or loose stools.          . metFORMIN (GLUCOPHAGE) 500 MG tablet   Oral   Take 500 mg by mouth 2 (two) times daily with a meal.         . Multiple Vitamin (MULTIVITAMIN WITH MINERALS) TABS   Oral   Take 1 tablet by mouth daily.         . nitroGLYCERIN (NITROSTAT) 0.4 MG SL tablet   Sublingual   Place 1 tablet (0.4 mg total) under the tongue every 5 (five) minutes x 3 doses as needed for chest pain.   25 tablet   3   . pantoprazole (PROTONIX) 40 MG tablet   Oral   Take 1 tablet (40 mg total) by mouth daily.   30 tablet   0   . potassium chloride SA (K-DUR,KLOR-CON) 20 MEQ tablet   Oral   Take 20 mEq by mouth daily as needed (Takes only with Lasix).         . ranolazine (RANEXA) 500 MG 12 hr tablet   Oral   Take 1 tablet (500 mg total) by mouth 2 (two) times daily.   60 tablet   6   . sulfamethoxazole-trimethoprim (BACTRIM,SEPTRA) 400-80 MG per tablet   Oral   Take 2 tablets by mouth daily with supper.          . tamsulosin (FLOMAX) 0.4 MG CAPS   Oral   Take 0.4 mg by mouth daily after supper.          . Ticagrelor (BRILINTA) 90 MG TABS tablet   Oral   Take 1 tablet (90 mg total) by mouth 2 (two) times daily.   60 tablet   6    Triage Vitals; BP 131/63  Temp(Src) 97.9 F (36.6 C) (Oral)  Resp 20  Ht 5\' 10"  (1.778 m)  Wt 180 lb (81.647 kg)  BMI 25.83 kg/m2  SpO2 94%  Physical Exam  Nursing note and vitals reviewed. Constitutional: He is oriented to person, place, and time. He appears well-developed. No distress.  HENT:  Head: Normocephalic and atraumatic.  Eyes: Conjunctivae and EOM are normal.   Cardiovascular: Normal rate and regular rhythm.   Pulmonary/Chest: Effort normal. No stridor. No respiratory distress.  Abdominal: Soft. He exhibits no distension. There is no tenderness.  Non peritoneal  Musculoskeletal: He exhibits no edema.  Neurological: He is alert and oriented to person, place, and time.  Skin: Skin is warm and dry.  Psychiatric: He has a normal mood and affect.   ED Course  Procedures (including critical care time) DIAGNOSTIC STUDIES: Oxygen Saturation is 94% on room air, adeuate by my interpretation.    COORDINATION OF CARE: At 1040 PM Discussed treatment plan with patient which includes nausea medicine. Patient agrees.   Labs Review Labs Reviewed  CBC WITH DIFFERENTIAL - Abnormal; Notable for the following:    WBC 11.2 (*)    Neutrophils Relative % 90 (*)    Neutro Abs 10.1 (*)    Lymphocytes Relative 3 (*)    Lymphs Abs 0.4 (*)    All other components within normal limits  COMPREHENSIVE METABOLIC PANEL  TROPONIN I  LIPASE, BLOOD  CG4 I-STAT (LACTIC ACID)   I reviewed the x-ray, no notable changes from last week    Date: 08/26/2013  Rate: 95  Rhythm: normal sinus rhythm  QRS Axis: normal  Intervals: normal  ST/T Wave abnormalities: ST depressions laterally  Conduction Disutrbances:none  Narrative Interpretation:   Old EKG Reviewed: unchanged ABNORMAL  12:10 AM Patient continues to complain of intermittent chest pain. Patient's labs are largely reassuring, but his EKG is abnormal, though not notably changed from prior.  MDM  I personally performed the services described in this documentation, which was scribed in my presence. The recorded information has been reviewed and is accurate.   Patient presents with chest pain, nausea, vomiting.  Given the patient's age, comorbidities, including prior coronary interventions, though the patient had initially reassuring labs, he was admitted for further evaluation and management.    Carmin Muskrat, MD 08/27/13 [phone]

## 2013-08-26 NOTE — ED Notes (Signed)
Pt states he started vomiting 3 hours ago, also having abd pain and mild chest discomfort

## 2013-08-27 ENCOUNTER — Encounter (HOSPITAL_COMMUNITY): Payer: Self-pay | Admitting: Emergency Medicine

## 2013-08-27 DIAGNOSIS — R079 Chest pain, unspecified: Secondary | ICD-10-CM

## 2013-08-27 DIAGNOSIS — R112 Nausea with vomiting, unspecified: Secondary | ICD-10-CM | POA: Diagnosis present

## 2013-08-27 DIAGNOSIS — R197 Diarrhea, unspecified: Secondary | ICD-10-CM

## 2013-08-27 DIAGNOSIS — A0472 Enterocolitis due to Clostridium difficile, not specified as recurrent: Secondary | ICD-10-CM | POA: Diagnosis present

## 2013-08-27 DIAGNOSIS — E119 Type 2 diabetes mellitus without complications: Secondary | ICD-10-CM

## 2013-08-27 LAB — CREATININE, SERUM
Creatinine, Ser: 1.1 mg/dL (ref 0.50–1.35)
GFR calc non Af Amer: 61 mL/min — ABNORMAL LOW (ref >90)
GFR, EST AFRICAN AMERICAN: 70 mL/min — AB (ref >90)

## 2013-08-27 LAB — CBC
HCT: 43 % (ref 39.0–52.0)
HEMATOCRIT: 41.2 % (ref 39.0–52.0)
Hemoglobin: 14.1 g/dL (ref 13.0–17.0)
Hemoglobin: 14.5 g/dL (ref 13.0–17.0)
MCH: 33.3 pg (ref 26.0–34.0)
MCH: 33.2 pg (ref 26.0–34.0)
MCHC: 34.2 g/dL (ref 30.0–36.0)
MCHC: 33.7 g/dL (ref 30.0–36.0)
MCV: 97.4 fL (ref 78.0–100.0)
MCV: 98.4 fL (ref 78.0–100.0)
PLATELETS: 142 10*3/uL — AB (ref 150–400)
Platelets: 149 10*3/uL — ABNORMAL LOW (ref 150–400)
RBC: 4.23 MIL/uL (ref 4.22–5.81)
RBC: 4.37 MIL/uL (ref 4.22–5.81)
RDW: 13.9 % (ref 11.5–15.5)
RDW: 13.9 % (ref 11.5–15.5)
WBC: 10.3 10*3/uL (ref 4.0–10.5)
WBC: 10 10*3/uL (ref 4.0–10.5)

## 2013-08-27 LAB — COMPREHENSIVE METABOLIC PANEL
ALBUMIN: 3.6 g/dL (ref 3.5–5.2)
ALT: 22 U/L (ref 0–53)
AST: 20 U/L (ref 0–37)
Alkaline Phosphatase: 75 U/L (ref 39–117)
BILIRUBIN TOTAL: 0.5 mg/dL (ref 0.3–1.2)
BUN: 24 mg/dL — AB (ref 6–23)
CO2: 24 mEq/L (ref 19–32)
CREATININE: 1.04 mg/dL (ref 0.50–1.35)
Calcium: 8.5 mg/dL (ref 8.4–10.5)
Chloride: 100 mEq/L (ref 96–112)
GFR calc Af Amer: 75 mL/min — ABNORMAL LOW (ref >90)
GFR calc non Af Amer: 65 mL/min — ABNORMAL LOW (ref >90)
GLUCOSE: 246 mg/dL — AB (ref 70–99)
POTASSIUM: 4.7 meq/L (ref 3.7–5.3)
Sodium: 136 mEq/L — ABNORMAL LOW (ref 137–147)
TOTAL PROTEIN: 6.6 g/dL (ref 6.0–8.3)

## 2013-08-27 LAB — GLUCOSE, CAPILLARY
GLUCOSE-CAPILLARY: 152 mg/dL — AB (ref 70–99)
Glucose-Capillary: 201 mg/dL — ABNORMAL HIGH (ref 70–99)
Glucose-Capillary: 197 mg/dL — ABNORMAL HIGH (ref 70–99)
Glucose-Capillary: 133 mg/dL — ABNORMAL HIGH (ref 70–99)

## 2013-08-27 LAB — CLOSTRIDIUM DIFFICILE BY PCR: CDIFFPCR: POSITIVE — AB

## 2013-08-27 LAB — TROPONIN I
Troponin I: <0.3 ng/mL (ref <0.30)
Troponin I: 0.64 ng/mL (ref <0.30)
Troponin I: 0.74 ng/mL (ref <0.30)
Troponin I: 1.25 ng/mL (ref <0.30)

## 2013-08-27 LAB — RENAL FUNCTION PANEL
ALBUMIN: 3.5 g/dL (ref 3.5–5.2)
BUN: 24 mg/dL — ABNORMAL HIGH (ref 6–23)
CO2: 23 mEq/L (ref 19–32)
Calcium: 8.1 mg/dL — ABNORMAL LOW (ref 8.4–10.5)
Chloride: 98 mEq/L (ref 96–112)
Creatinine, Ser: 1.06 mg/dL (ref 0.50–1.35)
GFR calc non Af Amer: 63 mL/min — ABNORMAL LOW (ref >90)
GFR, EST AFRICAN AMERICAN: 73 mL/min — AB (ref >90)
Glucose, Bld: 258 mg/dL — ABNORMAL HIGH (ref 70–99)
Phosphorus: 2.9 mg/dL (ref 2.3–4.6)
Potassium: 4.7 mEq/L (ref 3.7–5.3)
SODIUM: 135 meq/L — AB (ref 137–147)

## 2013-08-27 MED ORDER — INSULIN ASPART 100 UNIT/ML ~~LOC~~ SOLN
0.0000 [IU] | Freq: Three times a day (TID) | SUBCUTANEOUS | Status: DC
  Administered 2013-08-27 (×2): 2 [IU] via SUBCUTANEOUS
  Administered 2013-08-27: 3 [IU] via SUBCUTANEOUS
  Administered 2013-08-28 (×2): 1 [IU] via SUBCUTANEOUS
  Administered 2013-08-28: 2 [IU] via SUBCUTANEOUS
  Administered 2013-08-29 (×2): 3 [IU] via SUBCUTANEOUS
  Administered 2013-08-29: 1 [IU] via SUBCUTANEOUS
  Administered 2013-08-30: 2 [IU] via SUBCUTANEOUS
  Administered 2013-08-30: 3 [IU] via SUBCUTANEOUS
  Administered 2013-08-30: 2 [IU] via SUBCUTANEOUS
  Administered 2013-08-31 – 2013-09-01 (×5): 3 [IU] via SUBCUTANEOUS

## 2013-08-27 MED ORDER — ASPIRIN EC 81 MG PO TBEC
81.0000 mg | DELAYED_RELEASE_TABLET | Freq: Every day | ORAL | Status: DC
  Administered 2013-08-27 – 2013-09-01 (×6): 81 mg via ORAL
  Filled 2013-08-27 (×8): qty 1

## 2013-08-27 MED ORDER — SODIUM CHLORIDE 0.9 % IV SOLN
INTRAVENOUS | Status: DC
  Administered 2013-08-27 – 2013-08-28 (×3): via INTRAVENOUS

## 2013-08-27 MED ORDER — ATORVASTATIN CALCIUM 40 MG PO TABS
40.0000 mg | ORAL_TABLET | Freq: Every day | ORAL | Status: DC
  Administered 2013-08-27 – 2013-08-31 (×5): 40 mg via ORAL
  Filled 2013-08-27 (×6): qty 1

## 2013-08-27 MED ORDER — ONDANSETRON HCL 4 MG/2ML IJ SOLN
4.0000 mg | Freq: Four times a day (QID) | INTRAMUSCULAR | Status: DC | PRN
  Administered 2013-08-27 (×2): 4 mg via INTRAVENOUS
  Filled 2013-08-27: qty 2

## 2013-08-27 MED ORDER — SODIUM CHLORIDE 0.9 % IV SOLN: INTRAVENOUS | Status: AC

## 2013-08-27 MED ORDER — LISINOPRIL 5 MG PO TABS
5.0000 mg | ORAL_TABLET | Freq: Every day | ORAL | Status: DC
  Administered 2013-08-27 – 2013-09-01 (×3): 5 mg via ORAL
  Filled 2013-08-27 (×5): qty 1

## 2013-08-27 MED ORDER — ONDANSETRON HCL 4 MG/2ML IJ SOLN
4.0000 mg | Freq: Three times a day (TID) | INTRAMUSCULAR | Status: AC | PRN
  Filled 2013-08-27: qty 2

## 2013-08-27 MED ORDER — TICAGRELOR 90 MG PO TABS
90.0000 mg | ORAL_TABLET | Freq: Two times a day (BID) | ORAL | Status: DC
  Administered 2013-08-27 – 2013-09-01 (×11): 90 mg via ORAL
  Filled 2013-08-27 (×14): qty 1

## 2013-08-27 MED ORDER — METRONIDAZOLE 500 MG PO TABS
500.0000 mg | ORAL_TABLET | Freq: Three times a day (TID) | ORAL | Status: DC
  Administered 2013-08-27 – 2013-09-01 (×16): 500 mg via ORAL
  Filled 2013-08-27 (×16): qty 1

## 2013-08-27 MED ORDER — RANOLAZINE ER 500 MG PO TB12
500.0000 mg | ORAL_TABLET | Freq: Two times a day (BID) | ORAL | Status: DC
  Administered 2013-08-27 – 2013-09-01 (×11): 500 mg via ORAL
  Filled 2013-08-27 (×14): qty 1

## 2013-08-27 MED ORDER — CARVEDILOL 3.125 MG PO TABS
3.1250 mg | ORAL_TABLET | Freq: Two times a day (BID) | ORAL | Status: DC
Start: 2013-08-27 — End: 2013-09-01
  Administered 2013-08-27 – 2013-09-01 (×6): 3.125 mg via ORAL
  Filled 2013-08-27 (×13): qty 1

## 2013-08-27 MED ORDER — ONDANSETRON HCL 4 MG PO TABS: 4.0000 mg | ORAL_TABLET | Freq: Four times a day (QID) | ORAL | Status: DC | PRN

## 2013-08-27 MED ORDER — PANTOPRAZOLE SODIUM 40 MG PO TBEC
40.0000 mg | DELAYED_RELEASE_TABLET | Freq: Every day | ORAL | Status: DC
Start: 2013-08-27 — End: 2013-09-01
  Administered 2013-08-27 – 2013-09-01 (×6): 40 mg via ORAL
  Filled 2013-08-27 (×6): qty 1

## 2013-08-27 MED ORDER — ENOXAPARIN SODIUM 40 MG/0.4ML ~~LOC~~ SOLN
40.0000 mg | SUBCUTANEOUS | Status: DC
  Administered 2013-08-27 – 2013-09-01 (×6): 40 mg via SUBCUTANEOUS
  Filled 2013-08-27 (×6): qty 0.4

## 2013-08-27 MED ORDER — HYDROMORPHONE HCL PF 1 MG/ML IJ SOLN
0.5000 mg | INTRAMUSCULAR | Status: AC | PRN
  Administered 2013-08-27: 0.5 mg via INTRAVENOUS
  Filled 2013-08-27: qty 1

## 2013-08-27 NOTE — Progress Notes (Addendum)
[facility]                                                                              Patient Demographics  Jeffrey [name], is a 80 y.o. male, DOB - 1929/03/27, [id number]  Admit date - 08/26/2013   Admitting Physician Oswald Hillock, MD  Outpatient Primary MD for the patient is [name]., MD  LOS - 1   Chief Complaint  Patient presents with  . Chest Pain  . Abdominal Pain  . Emesis      Assessment & Plan   Abdominal pain with Nausea/vomiting/Diarrhea secondary to C Diff infection -Cdiff PCR was positive -Patient will be started on flagyl 500mg  PO q8hrs -Will continue antiemetics for nausea as well as IVF  Chest pain with history of CAD -Currently chest pain free. Patient does have a history of CAD, EKG shows nonspecific ST-T wave changes. -Troponins negative x3 -Patient states he feels his chest pain was due to his vomiting.  -Continue Brilinta, aspirin, lipitor, coreg -Patient not on an ACEI ?  Diabetes Mellitus  -Metformin held -Continue insulin sliding scale with CBG monitoring  Hypertension Currently controlled. Will continue Coreg.  Hyperlipidemia Continue Lipitor.  Code Status: Full   Family Communication: None at bedside.  Disposition Plan: Admitted  Time Spent in minutes   30 minutes  Procedures None  Consults  None  DVT Prophylaxis  Lovenox   Lab Results  Component Value Date   PLT 149* 08/27/2013    Medications  Scheduled Meds: . sodium chloride   Intravenous STAT  . aspirin EC  81 mg Oral Daily  . atorvastatin  40 mg Oral q1800  . carvedilol  3.125 mg Oral BID WC  . enoxaparin (LOVENOX) injection  40 mg Subcutaneous Q24H  . insulin aspart  0-9 Units Subcutaneous TID WC  . pantoprazole  40 mg Oral Daily  . ranolazine  500 mg Oral BID  . Ticagrelor  90 mg Oral BID   Continuous Infusions: . sodium chloride 75 mL/hr at 08/27/13 0230   PRN Meds:.HYDROmorphone (DILAUDID) injection, ondansetron (ZOFRAN) IV,  ondansetron (ZOFRAN) IV, ondansetron  Antibiotics    Anti-infectives   None      Subjective:   Jeffrey [name] seen and examined today.  Patient continues to have diarrhea. He does state it is talking to her however not tarry or black. He denies any blood with his diarrhea. Patient denies any chest pain or shortness of breath at this time. He thinks that his chest pain is likely secondary to his vomiting. The patient is no longer vomiting. He does state that he is still nauseous.  Objective:   Filed Vitals:   08/26/13 2316 08/27/13 0012 08/27/13 0228 08/27/13 0619  BP: 125/63 119/65 121/77 135/83  Pulse: 96 94 95 96  Temp:   98.2 F (36.8 C) 97.8 F (36.6 C)  TempSrc:   Oral Oral  Resp: 17 16 20 18   Height:   5\' 10"  (1.778 m)   Weight:   80.3 kg (177 lb 0.5 oz)   SpO2: 97% 99% 95% 96%    Wt Readings from Last 3 Encounters:  08/27/13 80.3 kg (177 lb 0.5 oz)  08/18/13  81.647 kg (180 lb)  08/17/13 81.647 kg (180 lb)    No intake or output data in the 24 hours ending 08/27/13 0840  Exam  General: Well developed, well nourished, NAD, appears stated age  HEENT: NCAT, PERRLA, EOMI, Anicteic Sclera, mucous membranes moist.   Neck: Supple, no JVD, no masses  Cardiovascular: S1 S2 auscultated, no rubs, murmurs or gallops. Regular rate and rhythm.  Respiratory: Clear to auscultation bilaterally with equal chest rise  Abdomen: Soft, mild epigastric tenderness, nondistended, + bowel sounds  Extremities: warm dry without cyanosis clubbing or edema  Neuro: AAOx3, cranial nerves grossly intact. Strength 5/5 in patient's upper and lower extremities bilaterally  Skin: Without rashes exudates or nodules  Psych: Normal affect and demeanor with intact judgement and insight  Data Review   Micro Results No results found for this or any previous visit (from the past 240 hour(s)).  Radiology Reports Dg Chest 2 View  08/27/2013   CLINICAL DATA:  Chest pain, short of breath,  weakness and vomiting  EXAM: CHEST  2 VIEW  COMPARISON:  Prior chest x-ray 03/06/2014  FINDINGS: Stable cardiomegaly. Patient is status post median sternotomy with evidence of prior multivessel CABG including LIMA bypass. The upper-most sternal wire is fractured but unchanged in position compared to prior. Previously identified venous hypertension has improved and is no longer present. No focal airspace consolidation. Mild central bronchitic changes and pulmonary hyper expansion are stable. No acute osseous abnormality.  IMPRESSION: No active cardiopulmonary disease. Interval improvement in pulmonary venous hypertension compared to 08/17/2013.   Electronically Signed   By: Jacqulynn Cadet M.D.   On: 08/27/2013 00:17   Dg Chest Portable 1 View  08/17/2013   CLINICAL DATA:  Chest pain.  Diabetes.  Ischemic cardiomyopathy.  EXAM: PORTABLE CHEST - 1 VIEW  COMPARISON:  DG CHEST 2 VIEW dated 06/16/2013; DG CHEST 1V PORT dated 05/28/2013; DG CHEST 2 VIEW dated 05/21/2013; CT ANGIO CHEST W/CM &/OR WO/CM dated 05/23/2011  FINDINGS: The cardiomegaly noted with prior CABG. Prominent upper zone pulmonary vasculature suggest pulmonary venous hypertension. No overt edema. No pleural effusion.  Atherosclerotic calcification of the aortic arch.  IMPRESSION: 1. Cardiomegaly with pulmonary venous hypertension, but without overt edema.   Electronically Signed   By: Sherryl Barters M.D.   On: 08/17/2013 20:58    CBC  Recent Labs Lab 08/26/13 2255 08/27/13 0238 08/27/13 0616  WBC 11.2* 10.3 10.0  HGB 14.4 14.1 14.5  HCT 41.6 41.2 43.0  PLT 151 142* 149*  MCV 97.0 97.4 98.4  MCH 33.6 33.3 33.2  MCHC 34.6 34.2 33.7  RDW 13.7 13.9 13.9  LYMPHSABS 0.4*  --   --   MONOABS 0.6  --   --   EOSABS 0.2  --   --   BASOSABS 0.0  --   --     Chemistries   Recent Labs Lab 08/26/13 2255 08/27/13 0238 08/27/13 0616  NA 134*  --  136*  K 4.6  --  4.7  CL 98  --  100  CO2 22  --  24  GLUCOSE 204*  --  246*  BUN 22   --  24*  CREATININE 1.21 1.10 1.04  CALCIUM 9.1  --  8.5  AST 23  --  20  ALT 24  --  22  ALKPHOS 78  --  75  BILITOT 0.3  --  0.5   ------------------------------------------------------------------------------------------------------------------ estimated creatinine clearance is 56.5 ml/min (by C-G formula based on Cr of  1.04). ------------------------------------------------------------------------------------------------------------------ No results found for this basename: HGBA1C,  in the last 72 hours ------------------------------------------------------------------------------------------------------------------ No results found for this basename: CHOL, HDL, LDLCALC, TRIG, CHOLHDL, LDLDIRECT,  in the last 72 hours ------------------------------------------------------------------------------------------------------------------ No results found for this basename: TSH, T4TOTAL, FREET3, T3FREE, THYROIDAB,  in the last 72 hours ------------------------------------------------------------------------------------------------------------------ No results found for this basename: VITAMINB12, FOLATE, FERRITIN, TIBC, IRON, RETICCTPCT,  in the last 72 hours  Coagulation profile No results found for this basename: INR, PROTIME,  in the last 168 hours  No results found for this basename: DDIMER,  in the last 72 hours  Cardiac Enzymes  Recent Labs Lab 08/26/13 2255 08/27/13 0238 08/27/13 0759  TROPONINI <0.30 <0.30 <0.30   ------------------------------------------------------------------------------------------------------------------ No components found with this basename: POCBNP,     Cherise Fedder D.O. on 08/27/2013 at 8:40 AM  Between 7am to 7pm - Pager - [phone]  After 7pm go to [email or web address] - password [email or web address]  And look for the night coverage person covering for me after hours  [place]  [phone]  Addendum Patient had elevated troponin  at 0.64, repeat 0.74.  Spoke with Dr. Haroldine Laws.  Will continue to monitor the couple of troponin level.  Likely given his infection with Cdiff and history of CAD, this is probably demand ischemia.  If the troponin level reaches close to 2, please contact cardiology and transfer to [facility].   Total patient care time (including reassessment for elevated troponin) 60 minutes.

## 2013-08-27 NOTE — Progress Notes (Signed)
CRITICAL VALUE ALERT  Critical value received:  Troponin - 0.64  Date of notification:  08/27/13  Time of notification:  3007  Critical value read back:yes  Nurse who received alert:  Jerrye Noble RN  MD notified (1st page):  Mikhail  Time of first page:  1500  MD notified (2nd page):  Time of second page:  Responding MD:  Ree Kida  Time MD responded:  1500  MD made aware, new orders placed and carried out as ordered for another stat troponin and EKG.

## 2013-08-27 NOTE — Progress Notes (Signed)
CRITICAL VALUE ALERT  Critical value received:  Troponin - 0.74  Date of notification:  08/27/13  Time of notification:  2836  Critical value read back:yes  Nurse who received alert:  Jerrye Noble RN  MD notified (1st page):  Mikhail  Time of first page:  1545  MD notified (2nd page):  Time of second page:  Responding MD:  Ree Kida  Time MD responded:  [phone]

## 2013-08-27 NOTE — H&P (Signed)
PCP:   [name]., MD   Chief Complaint:  Nausea vomiting  HPI: 80 year old male who   has a past medical history of Diabetes mellitus, type II; Hyperlipidemia; Coronary atherosclerosis of native coronary artery; Essential hypertension, benign; Osteoarthritis; History of stroke; History of pneumonia; Cervical vertebral fracture; Chronic back pain; Benign prostatic hypertrophy; Peptic ulcer disease; Gastroesophageal reflux disease; and Ischemic cardiomyopathy.  Today presented to the ED with a chief complaint of nausea vomiting and chest pain that started at 7 PM last night. Patient complains of mild shortness of breath over the past 6 months, but no other significant complaints, and certainly at 7 PM he started vomiting, he also complains of mild abdominal pain. At this time chest pain has resolved, patient started having diarrhea in the ED. Patient admits to having had taken a flu shot this year about denies any sick contacts at home. Patient lives by himself.   Allergies:  No Known Allergies    Past Medical History  Diagnosis Date  . Diabetes mellitus, type II   . Hyperlipidemia     Lipid profile in 02/2012:135, 227, 41, 49  . Coronary atherosclerosis of native coronary artery     a. CABG x 4 in 1989 (VG->OM1->OM2, VG->RCA, LIMA->LAD), b. 05/2010: DES to VG-OM1/OM2, DES to distal LCx. c. NSTEMI in 04/2011 - TO distal LCX stent and VG->OM2. d. 02/2012 NSTEMI DES to VG-OM1/continuation to OM2 occluded. e. inferior STEMI s/p DES to SVG-RAMUS 06/2012. f. inferolat STEMI 09/2012 s/p DES to SVG-interm; g. Lex MV (11/14):  EF 35%, inf-lat scar with small peri-infarct ischemia  . Essential hypertension, benign   . Osteoarthritis   . History of stroke   . History of pneumonia   . Cervical vertebral fracture   . Chronic back pain   . Benign prostatic hypertrophy     History of urinary retention  . Peptic ulcer disease   . Gastroesophageal reflux disease   . Ischemic cardiomyopathy      LVEF 45-50%; Echo (04/2013):  mild LVH, EF 50%, Gr 1 DD, inf-lat HK, inf-septal and ant-lat HK, Tr MR, mild LAE    Past Surgical History  Procedure Laterality Date  . Tonsillectomy    . Coronary artery bypass graft      Prior to Admission medications   Medication Sig Start Date End Date Taking? Authorizing Provider  albuterol (PROVENTIL HFA;VENTOLIN HFA) 108 (90 BASE) MCG/ACT inhaler Inhale 2 puffs into the lungs every 6 (six) hours as needed for wheezing or shortness of breath.    Historical Provider, MD  aspirin 81 MG tablet Take 1 tablet (81 mg total) by mouth daily. 06/17/13   Shanker Kristeen Mans, MD  atorvastatin (LIPITOR) 40 MG tablet Take 1 tablet (40 mg total) by mouth daily at 6 PM. 05/29/13   Liliane Shi, PA-C  carvedilol (COREG) 3.125 MG tablet Take 1 tablet (3.125 mg total) by mouth 2 (two) times daily with a meal. 05/22/13   Brooke O Edmisten, PA-C  ferrous sulfate 325 (65 FE) MG tablet Take 325 mg by mouth daily with breakfast.    Historical Provider, MD  furosemide (LASIX) 20 MG tablet Take 20 mg by mouth daily as needed for fluid. Take 1 tablet as needed if 3 lb weight gain. 05/06/13   Lendon Colonel, NP  loperamide (IMODIUM) 2 MG capsule Take 2 mg by mouth as needed for diarrhea or loose stools.  02/28/13   Historical Provider, MD  metFORMIN (GLUCOPHAGE) 500 MG tablet Take 500 mg  by mouth 2 (two) times daily with a meal.    Historical Provider, MD  Multiple Vitamin (MULTIVITAMIN WITH MINERALS) TABS Take 1 tablet by mouth daily.    Historical Provider, MD  nitroGLYCERIN (NITROSTAT) 0.4 MG SL tablet Place 1 tablet (0.4 mg total) under the tongue every 5 (five) minutes x 3 doses as needed for chest pain. 11/11/12 11/11/13  Rhonda G Barrett, PA-C  pantoprazole (PROTONIX) 40 MG tablet Take 1 tablet (40 mg total) by mouth daily. 06/17/13   Shanker Kristeen Mans, MD  potassium chloride SA (K-DUR,KLOR-CON) 20 MEQ tablet Take 20 mEq by mouth daily as needed (Takes only with Lasix). 04/28/13    Lendon Colonel, NP  ranolazine (RANEXA) 500 MG 12 hr tablet Take 1 tablet (500 mg total) by mouth 2 (two) times daily. 06/24/13   Lendon Colonel, NP  sulfamethoxazole-trimethoprim (BACTRIM,SEPTRA) 400-80 MG per tablet Take 2 tablets by mouth daily with supper.  05/02/13   Historical Provider, MD  tamsulosin (FLOMAX) 0.4 MG CAPS Take 0.4 mg by mouth daily after supper.  01/27/13   Historical Provider, MD  Ticagrelor (BRILINTA) 90 MG TABS tablet Take 1 tablet (90 mg total) by mouth 2 (two) times daily. 06/21/12   Meriel Pica, PA-C    Social History:  reports that he quit smoking about 52 years ago. His smoking use included Cigarettes. He has a 20 pack-year smoking history. His smokeless tobacco use includes Chew. He reports that he does not drink alcohol or use illicit drugs.  Family History  Problem Relation Age of Onset  . Early death      Parents died young  . Appendicitis Mother     Pt was 17 year old  . Heart attack Father 7     All the positives are listed in BOLD  Review of Systems:  HEENT: Headache, blurred vision, runny nose, sore throat Neck: Hypothyroidism, hyperthyroidism,,lymphadenopathy Chest : Shortness of breath, history of COPD, Asthma Heart : Chest pain, history of coronary arterey disease  GI:  Nausea, vomiting, diarrhea, constipation, GERD GU: Dysuria, urgency, frequency of urination, hematuria Neuro: Stroke, seizures, syncope Psych: Depression, anxiety, hallucinations   Physical Exam: Blood pressure 119/65, pulse 94, temperature 97.9 F (36.6 C), temperature source Oral, resp. rate 16, height _0  (1.778 m), weight 81.647 kg (180 lb), SpO2 99.00%. Constitutional:   Patient is a well-developed and well-nourished male* in no acute distress and cooperative with exam. Head: Normocephalic and atraumatic Mouth: Mucus membranes moist Eyes: PERRL, EOMI, conjunctivae normal Neck: Supple, No Thyromegaly Cardiovascular: RRR, S1 normal, S2  normal Pulmonary/Chest: CTAB, no wheezes, rales, or rhonchi Abdominal: Soft. Mild right upper quadrant and epigastric tenderness to palpation , non-distended, bowel sounds are normal, no masses, organomegaly, or guarding present.  Neurological: A&O x3, Strenght is normal and symmetric bilaterally, cranial nerve II-XII are grossly intact, no focal motor deficit, sensory intact to light touch bilaterally.  Extremities : No Cyanosis, Clubbing or Edema   Labs on Admission:  Results for orders placed during the hospital encounter of 08/26/13 (from the past 48 hour(s))  CBC WITH DIFFERENTIAL     Status: Abnormal   Collection Time    08/26/13 10:55 PM      Result Value Ref Range   WBC 11.2 (*) 4.0 - 10.5 K/uL   RBC 4.29  4.22 - 5.81 MIL/uL   Hemoglobin 14.4  13.0 - 17.0 g/dL   HCT 41.6  39.0 - 52.0 %   MCV 97.0  78.0 - 100.0  fL   MCH 33.6  26.0 - 34.0 pg   MCHC 34.6  30.0 - 36.0 g/dL   RDW 13.7  11.5 - 15.5 %   Platelets 151  150 - 400 K/uL   Neutrophils Relative % 90 (*) 43 - 77 %   Neutro Abs 10.1 (*) 1.7 - 7.7 K/uL   Lymphocytes Relative 3 (*) 12 - 46 %   Lymphs Abs 0.4 (*) 0.7 - 4.0 K/uL   Monocytes Relative 5  3 - 12 %   Monocytes Absolute 0.6  0.1 - 1.0 K/uL   Eosinophils Relative 2  0 - 5 %   Eosinophils Absolute 0.2  0.0 - 0.7 K/uL   Basophils Relative 0  0 - 1 %   Basophils Absolute 0.0  0.0 - 0.1 K/uL  COMPREHENSIVE METABOLIC PANEL     Status: Abnormal   Collection Time    08/26/13 10:55 PM      Result Value Ref Range   Sodium 134 (*) 137 - 147 mEq/L   Potassium 4.6  3.7 - 5.3 mEq/L   Chloride 98  96 - 112 mEq/L   CO2 22  19 - 32 mEq/L   Glucose, Bld 204 (*) 70 - 99 mg/dL   BUN 22  6 - 23 mg/dL   Creatinine, Ser 1.21  0.50 - 1.35 mg/dL   Calcium 9.1  8.4 - 10.5 mg/dL   Total Protein 7.3  6.0 - 8.3 g/dL   Albumin 4.1  3.5 - 5.2 g/dL   AST 23  0 - 37 U/L   ALT 24  0 - 53 U/L   Alkaline Phosphatase 78  39 - 117 U/L   Total Bilirubin 0.3  0.3 - 1.2 mg/dL   GFR calc  non Af Amer 54 (*) >90 mL/min   GFR calc Af Amer 63 (*) >90 mL/min   Comment: (NOTE)     The eGFR has been calculated using the CKD EPI equation.     This calculation has not been validated in all clinical situations.     eGFR's persistently <90 mL/min signify possible Chronic Kidney     Disease.  TROPONIN I     Status: None   Collection Time    08/26/13 10:55 PM      Result Value Ref Range   Troponin I <0.30  <0.30 ng/mL   Comment:            Due to the release kinetics of cTnI,     a negative result within the first hours     of the onset of symptoms does not rule out     myocardial infarction with certainty.     If myocardial infarction is still suspected,     repeat the test at appropriate intervals.  LIPASE, BLOOD     Status: None   Collection Time    08/26/13 10:55 PM      Result Value Ref Range   Lipase 36  11 - 59 U/L  CG4 I-STAT (LACTIC ACID)     Status: None   Collection Time    08/26/13 11:22 PM      Result Value Ref Range   Lactic Acid, Venous 1.40  0.5 - 2.2 mmol/L    Radiological Exams on Admission: Dg Chest 2 View  08/27/2013   CLINICAL DATA:  Chest pain, short of breath, weakness and vomiting  EXAM: CHEST  2 VIEW  COMPARISON:  Prior chest x-ray 03/06/2014  FINDINGS: Stable cardiomegaly. Patient  is status post median sternotomy with evidence of prior multivessel CABG including LIMA bypass. The upper-most sternal wire is fractured but unchanged in position compared to prior. Previously identified venous hypertension has improved and is no longer present. No focal airspace consolidation. Mild central bronchitic changes and pulmonary hyper expansion are stable. No acute osseous abnormality.  IMPRESSION: No active cardiopulmonary disease. Interval improvement in pulmonary venous hypertension compared to 08/17/2013.   Electronically Signed   By: Jacqulynn Cadet M.D.   On: 08/27/2013 00:17    Assessment/Plan Active Problems:   Diabetes mellitus, type II   Coronary  atherosclerosis of native coronary artery   Chest pain   Nausea & vomiting   Diarrhea  Chest pain Patient has a history of CAD, EKG shows nonspecific ST-T changes, Will obtain serial cardiac  enzymes.   Gastroenteritis Likely viral, Will treat symptomatically with Zofran. Will  Also obtain stool for C diff. Stool guaic is negative. We'll  Start IV normal saline at 75 ml/hr.  H/o CAD Will continue the home medications including Aspirin, Brillinta, Ranexa, Lopressor.  DM Start SSI, hold metformin.    Code status: Full code     Time Spent on Admission: 55 min  [facility] Pager: [phone] 08/27/2013, 2:02 AM  If 7PM-7AM, please contact night-coverage  [email or web address]  Password [email or web address]

## 2013-08-27 NOTE — ED Notes (Signed)
Transported to room 318 via stretcher on bed pan secondary to sudden urges for diarrhea. Off telemetry per verbal order dr Darrick Meigs

## 2013-08-27 NOTE — Progress Notes (Signed)
CRITICAL VALUE ALERT  Critical value received:  C. Diff positive  Date of notification:  08/27/13  Time of notification:  1010  Critical value read back:yes  Nurse who received alert:  Jerrye Noble RN  MD notified (1st page):  Mikhail  Time of first page:  1015  MD paged to make aware of results.

## 2013-08-27 NOTE — Progress Notes (Signed)
Called by nurse for elevated troponin 1.25, it has gone up from previous 0.74. Called and discussed with cardiologist on call Dr Percival Spanish who recommends medical management only as the patient is asymptomatic and no need to transfer to [facility]. Most likely troponin elevation is due to demand ischemia. Continue with current treatment.

## 2013-08-27 NOTE — ED Notes (Signed)
Up to bedside commode for the 4th time in last 80min. Having large amt of brown foul smelling stool which is heme neg. Pt tolerating it well. Color remains good. Skin warm and dry.

## 2013-08-27 NOTE — Progress Notes (Signed)
CRITICAL VALUE ALERT  Critical value received: Troponin 1.25  Date of notification:  2/14/015  Time of notification:  2231  Critical value read back:yes  Nurse who received alert:  Heber Many  MD notified (1st page):  Yes Lama  Time of first page:  2235  MD notified (2nd page):no  Time of second page:no  Responding MD:  Darrick Meigs  Time MD responded:  2238

## 2013-08-28 DIAGNOSIS — I10 Essential (primary) hypertension: Secondary | ICD-10-CM

## 2013-08-28 DIAGNOSIS — E785 Hyperlipidemia, unspecified: Secondary | ICD-10-CM

## 2013-08-28 DIAGNOSIS — I2510 Atherosclerotic heart disease of native coronary artery without angina pectoris: Secondary | ICD-10-CM

## 2013-08-28 DIAGNOSIS — D649 Anemia, unspecified: Secondary | ICD-10-CM

## 2013-08-28 LAB — CK TOTAL AND CKMB (NOT AT ARMC)
CK, MB: 15.9 ng/mL (ref 0.3–4.0)
Relative Index: 5.4 — ABNORMAL HIGH (ref 0.0–2.5)
Total CK: 294 U/L — ABNORMAL HIGH (ref 7–232)

## 2013-08-28 LAB — GLUCOSE, CAPILLARY
GLUCOSE-CAPILLARY: 128 mg/dL — AB (ref 70–99)
GLUCOSE-CAPILLARY: 147 mg/dL — AB (ref 70–99)
Glucose-Capillary: 158 mg/dL — ABNORMAL HIGH (ref 70–99)
Glucose-Capillary: 149 mg/dL — ABNORMAL HIGH (ref 70–99)

## 2013-08-28 LAB — TROPONIN I
TROPONIN I: 2.06 ng/mL — AB (ref <0.30)
Troponin I: 1.79 ng/mL (ref <0.30)

## 2013-08-28 MED ORDER — ACETAMINOPHEN 325 MG PO TABS
650.0000 mg | ORAL_TABLET | Freq: Four times a day (QID) | ORAL | Status: DC | PRN
  Administered 2013-08-28 – 2013-09-01 (×4): 650 mg via ORAL
  Filled 2013-08-28 (×4): qty 2

## 2013-08-28 MED ORDER — ALBUTEROL SULFATE (2.5 MG/3ML) 0.083% IN NEBU
2.5000 mg | INHALATION_SOLUTION | Freq: Four times a day (QID) | RESPIRATORY_TRACT | Status: DC | PRN
  Administered 2013-08-28 – 2013-08-30 (×5): 2.5 mg via RESPIRATORY_TRACT
  Filled 2013-08-28 (×5): qty 3

## 2013-08-28 NOTE — Progress Notes (Signed)
BP meds held this AM d/t low BP.  MD aware.

## 2013-08-28 NOTE — Progress Notes (Signed)
Utilization review completed.  

## 2013-08-28 NOTE — Progress Notes (Signed)
CRITICAL VALUE ALERT  Critical value received:  CKMB 15.9  Date of notification:  08/28/13  Time of notification:  6010  Critical value read back:yes  Nurse who received alert:  Gershon Cull, RN  MD notified (1st page):  Dr. Cristal Ford  Time of first page:  1540  MD notified (2nd page):  Time of second page:  Responding MD:  Dr. Ree Kida  Time MD responded:  1540  Troponin 1.79 - Dr. Ree Kida called to say she had received the page.  Dr. Ree Kida stated that she spoke to Cardiology and the patient will be staying at [facility].

## 2013-08-28 NOTE — Progress Notes (Signed)
Pt being monitored every hour due to troponin elevation. Pt is still currently asymptomatic, but continues to have frequent bowel movents due to c-diff. Will continue to monitor.

## 2013-08-28 NOTE — Progress Notes (Signed)
CRITICAL VALUE ALERT  Critical value received:  Troponin 2.06  Date of notification:  08/28/13  Time of notification:  0855  Critical value read back:yes  Nurse who received alert:  Jerrye Noble RN  MD notified (1st page):  Mikhail  Time of first page:  [phone]  MD notified (2nd page):  Time of second page:  Responding MD:  Ree Kida  Time MD responded:  [phone]

## 2013-08-28 NOTE — Progress Notes (Signed)
Pt c/o shortness of breath with exertion. Pt states he has an inhaler at home when he gets this way. Mid level called. Order given for nebs PRN. Respiratory called. O2 sat is 96%. Will continue to monitor.

## 2013-08-28 NOTE — Progress Notes (Signed)
[facility]                                                                              Patient Demographics  Jeffrey [name], is a 80 y.o. male, DOB - 02-Jun-1929, OB:[id number]  Admit date - 08/26/2013   Admitting Physician Oswald Hillock, MD  Outpatient Primary MD for the patient is [name]., MD  LOS - 2   Chief Complaint  Patient presents with  . Chest Pain  . Abdominal Pain  . Emesis      Assessment & Plan   Abdominal pain with Nausea/vomiting/Diarrhea secondary to C Diff infection -Cdiff PCR was positive -Continue flagyl 500mg  PO q8hrs -Nausea/vomiting have subsided  Elevated Troponin possibly Demand Ischemia -Currently chest pain free for >24hrs, felt his CP was due to vomiting -Patient does have a history of CAD, EKG shows nonspecific ST-T wave changes, more pronounced TWI -Spoke with Dr. Haroldine Laws on 2/14 when trop 0.6, recommended to continue to trend -Troponins trending upward, 2.06 -Spoke with Dr. Bronson Ing 2/15, will continue to trend troponin, if higher, may transfer to [facility] -Continue Brilinta, aspirin, lipitor, coreg, lisinopril  Diabetes Mellitus  -Metformin held -Continue insulin sliding scale with CBG monitoring  Hypertension Currently controlled. Will continue Coreg and lisinopril  Hyperlipidemia Continue Lipitor.  Code Status: Full   Family Communication: None at bedside.  Disposition Plan: Admitted  Time Spent in minutes   30 minutes  Procedures None  Consults   Cardiology via phone  DVT Prophylaxis  Lovenox   Lab Results  Component Value Date   PLT 149* 08/27/2013    Medications  Scheduled Meds: . aspirin EC  81 mg Oral Daily  . atorvastatin  40 mg Oral q1800  . carvedilol  3.125 mg Oral BID WC  . enoxaparin (LOVENOX) injection  40 mg Subcutaneous Q24H  . insulin aspart  0-9 Units Subcutaneous TID WC  . lisinopril  5 mg Oral Daily  . metroNIDAZOLE  500 mg Oral 3 times per day  . pantoprazole  40 mg Oral  Daily  . ranolazine  500 mg Oral BID  . Ticagrelor  90 mg Oral BID   Continuous Infusions: . sodium chloride 75 mL/hr at 08/28/13 0021   PRN Meds:.ondansetron (ZOFRAN) IV, ondansetron  Antibiotics    Anti-infectives   Start     Dose/Rate Route Frequency Ordered Stop   08/27/13 1400  metroNIDAZOLE (FLAGYL) tablet 500 mg     500 mg Oral 3 times per day 08/27/13 1058 09/10/13 1359      Subjective:   Jeffrey [name] seen and examined today.  Patient continues to have diarrhea. Denies further nausea/vomiting.  He denies chest pain or SOB.   Objective:   Filed Vitals:   08/27/13 1540 08/27/13 2125 08/28/13 0451 08/28/13 0516  BP: 111/59 92/41 79/41  100/58  Pulse: 66 85 77   Temp: 98.1 F (36.7 C) 98.5 F (36.9 C) 98.3 F (36.8 C)   TempSrc: Oral Oral Oral   Resp: 18 18 20    Height:      Weight:      SpO2: 99% 96% 97%     Wt Readings from Last 3 Encounters:  08/27/13 80.3 kg (  177 lb 0.5 oz)  08/18/13 81.647 kg (180 lb)  08/17/13 81.647 kg (180 lb)     Intake/Output Summary (Last 24 hours) at 08/28/13 0848 Last data filed at 08/28/13 0021  Gross per 24 hour  Intake 1878.75 ml  Output      5 ml  Net 1873.75 ml    Exam  General: Well developed, well nourished, NAD, appears stated age  HEENT: NCAT, mucous membranes moist.   Neck: Supple, no JVD, no masses  Cardiovascular: S1 S2 auscultated, no rubs, murmurs or gallops. Regular rate and rhythm.  Respiratory: Clear to auscultation bilaterally with equal chest rise  Abdomen: Soft, mild epigastric tenderness, nondistended, + bowel sounds  Extremities: warm dry without cyanosis clubbing or edema  Neuro: AAOx3, no focal deficits   Skin: Without rashes exudates or nodules  Psych: Normal affect and demeanor with intact judgement and insight  Data Review   Micro Results Recent Results (from the past 240 hour(s))  CLOSTRIDIUM DIFFICILE BY PCR     Status: Abnormal   Collection Time    08/27/13  9:11 AM       Result Value Ref Range Status   C difficile by pcr POSITIVE (*) NEGATIVE Final   Comment: CRITICAL RESULT CALLED TO, READ BACK BY AND VERIFIED WITH:     BULLINS M. AT 1010A ON [id number] BY THOMPSON S.    Radiology Reports Dg Chest 2 View  08/27/2013   CLINICAL DATA:  Chest pain, short of breath, weakness and vomiting  EXAM: CHEST  2 VIEW  COMPARISON:  Prior chest x-ray 03/06/2014  FINDINGS: Stable cardiomegaly. Patient is status post median sternotomy with evidence of prior multivessel CABG including LIMA bypass. The upper-most sternal wire is fractured but unchanged in position compared to prior. Previously identified venous hypertension has improved and is no longer present. No focal airspace consolidation. Mild central bronchitic changes and pulmonary hyper expansion are stable. No acute osseous abnormality.  IMPRESSION: No active cardiopulmonary disease. Interval improvement in pulmonary venous hypertension compared to 08/17/2013.   Electronically Signed   By: Jacqulynn Cadet M.D.   On: 08/27/2013 00:17   Dg Chest Portable 1 View  08/17/2013   CLINICAL DATA:  Chest pain.  Diabetes.  Ischemic cardiomyopathy.  EXAM: PORTABLE CHEST - 1 VIEW  COMPARISON:  DG CHEST 2 VIEW dated 06/16/2013; DG CHEST 1V PORT dated 05/28/2013; DG CHEST 2 VIEW dated 05/21/2013; CT ANGIO CHEST W/CM &/OR WO/CM dated 05/23/2011  FINDINGS: The cardiomegaly noted with prior CABG. Prominent upper zone pulmonary vasculature suggest pulmonary venous hypertension. No overt edema. No pleural effusion.  Atherosclerotic calcification of the aortic arch.  IMPRESSION: 1. Cardiomegaly with pulmonary venous hypertension, but without overt edema.   Electronically Signed   By: Sherryl Barters M.D.   On: 08/17/2013 20:58    CBC  Recent Labs Lab 08/26/13 2255 08/27/13 0238 08/27/13 0616  WBC 11.2* 10.3 10.0  HGB 14.4 14.1 14.5  HCT 41.6 41.2 43.0  PLT 151 142* 149*  MCV 97.0 97.4 98.4  MCH 33.6 33.3 33.2  MCHC 34.6 34.2 33.7  RDW  13.7 13.9 13.9  LYMPHSABS 0.4*  --   --   MONOABS 0.6  --   --   EOSABS 0.2  --   --   BASOSABS 0.0  --   --     Chemistries   Recent Labs Lab 08/26/13 2255 08/27/13 0238 08/27/13 0616 08/27/13 0759  NA 134*  --  136* 135*  K 4.6  --  4.7 4.7  CL 98  --  100 98  CO2 22  --  24 23  GLUCOSE 204*  --  246* 258*  BUN 22  --  24* 24*  CREATININE 1.21 1.10 1.04 1.06  CALCIUM 9.1  --  8.5 8.1*  AST 23  --  20  --   ALT 24  --  22  --   ALKPHOS 78  --  75  --   BILITOT 0.3  --  0.5  --    ------------------------------------------------------------------------------------------------------------------ estimated creatinine clearance is 55.5 ml/min (by C-G formula based on Cr of 1.06). ------------------------------------------------------------------------------------------------------------------ No results found for this basename: HGBA1C,  in the last 72 hours ------------------------------------------------------------------------------------------------------------------ No results found for this basename: CHOL, HDL, LDLCALC, TRIG, CHOLHDL, LDLDIRECT,  in the last 72 hours ------------------------------------------------------------------------------------------------------------------ No results found for this basename: TSH, T4TOTAL, FREET3, T3FREE, THYROIDAB,  in the last 72 hours ------------------------------------------------------------------------------------------------------------------ No results found for this basename: VITAMINB12, FOLATE, FERRITIN, TIBC, IRON, RETICCTPCT,  in the last 72 hours  Coagulation profile No results found for this basename: INR, PROTIME,  in the last 168 hours  No results found for this basename: DDIMER,  in the last 72 hours  Cardiac Enzymes  Recent Labs Lab 08/27/13 1404 08/27/13 1514 08/27/13 2155  TROPONINI 0.64* 0.74* 1.25*    ------------------------------------------------------------------------------------------------------------------ No components found with this basename: POCBNP,     Bona Hubbard D.O. on 08/28/2013 at 8:48 AM  Between 7am to 7pm - Pager - [phone]  After 7pm go to [email or web address] - password [email or web address]  And look for the night coverage person covering for me after hours  [facility] Office  [phone]

## 2013-08-28 NOTE — Progress Notes (Signed)
Patient's BP 107/62 and heart rate 74.  BP was lower earlier today.  Dr. Ree Kida notified via text page.  Gave order to hold evening dose of Coreg.  Orders followed.

## 2013-08-29 DIAGNOSIS — I959 Hypotension, unspecified: Secondary | ICD-10-CM

## 2013-08-29 DIAGNOSIS — A0472 Enterocolitis due to Clostridium difficile, not specified as recurrent: Principal | ICD-10-CM

## 2013-08-29 DIAGNOSIS — I2589 Other forms of chronic ischemic heart disease: Secondary | ICD-10-CM

## 2013-08-29 DIAGNOSIS — R072 Precordial pain: Secondary | ICD-10-CM

## 2013-08-29 DIAGNOSIS — I214 Non-ST elevation (NSTEMI) myocardial infarction: Secondary | ICD-10-CM

## 2013-08-29 LAB — BASIC METABOLIC PANEL
BUN: 14 mg/dL (ref 6–23)
CALCIUM: 7.8 mg/dL — AB (ref 8.4–10.5)
CO2: 19 mEq/L (ref 19–32)
Chloride: 102 mEq/L (ref 96–112)
Creatinine, Ser: 0.91 mg/dL (ref 0.50–1.35)
GFR calc Af Amer: 89 mL/min — ABNORMAL LOW (ref >90)
GFR, EST NON AFRICAN AMERICAN: 77 mL/min — AB (ref >90)
GLUCOSE: 202 mg/dL — AB (ref 70–99)
Potassium: 3.9 mEq/L (ref 3.7–5.3)
Sodium: 134 mEq/L — ABNORMAL LOW (ref 137–147)

## 2013-08-29 LAB — GLUCOSE, CAPILLARY
GLUCOSE-CAPILLARY: 224 mg/dL — AB (ref 70–99)
GLUCOSE-CAPILLARY: 145 mg/dL — AB (ref 70–99)
Glucose-Capillary: 211 mg/dL — ABNORMAL HIGH (ref 70–99)
Glucose-Capillary: 176 mg/dL — ABNORMAL HIGH (ref 70–99)

## 2013-08-29 LAB — CBC
HCT: 36.6 % — ABNORMAL LOW (ref 39.0–52.0)
Hemoglobin: 12.5 g/dL — ABNORMAL LOW (ref 13.0–17.0)
MCH: 33.5 pg (ref 26.0–34.0)
MCHC: 34.2 g/dL (ref 30.0–36.0)
MCV: 98.1 fL (ref 78.0–100.0)
PLATELETS: 125 10*3/uL — AB (ref 150–400)
RBC: 3.73 MIL/uL — AB (ref 4.22–5.81)
RDW: 14 % (ref 11.5–15.5)
WBC: 10 10*3/uL (ref 4.0–10.5)

## 2013-08-29 MED ORDER — LORAZEPAM 0.5 MG PO TABS
0.5000 mg | ORAL_TABLET | Freq: Once | ORAL | Status: AC
  Administered 2013-08-29: 0.5 mg via ORAL
  Filled 2013-08-29: qty 1

## 2013-08-29 NOTE — Consult Note (Signed)
CARDIOLOGY CONSULT NOTE   Patient ID: Jeffrey [name] MRN: [id number] DOB/AGE: [birth year] 80 y.o.  Admit Date: 08/26/2013 Referring Physician: PTH Primary Physician: [name]., MD Consulting Cardiologist: [name] MD Primary Cardiologist: [name] Reason for Consultation: Chest Pain  Clinical Summary Jeffrey [name] is a 80 y.o.male with known history of CAD, medically managed with frequent admissions for recurrent chest pain, hypertension, admitted with C-Diff, nausea, vomiting, diarrhea,  and abdominal pain. Cardiac enzymes were found to be positive 0.74; 1.25;2.06; 1.79 respectively, with new T-wave inversion in the inferior leads.      He states he has been feeling weak for the last 3 weeks. Tired and no appetite. For the last week, he has been having increasing diarrhea and nausea with some vomiting. He is now complaining of weakness and wheezing. No further chest pain.      He wants to go to [facility] to recover once he is discharged, as he did well there in the past.      No Known Allergies  Medications Scheduled Medications: . aspirin EC  81 mg Oral Daily  . atorvastatin  40 mg Oral q1800  . carvedilol  3.125 mg Oral BID WC  . enoxaparin (LOVENOX) injection  40 mg Subcutaneous Q24H  . insulin aspart  0-9 Units Subcutaneous TID WC  . lisinopril  5 mg Oral Daily  . metroNIDAZOLE  500 mg Oral 3 times per day  . pantoprazole  40 mg Oral Daily  . ranolazine  500 mg Oral BID  . Ticagrelor  90 mg Oral BID    Infusions: . sodium chloride 75 mL/hr at 08/28/13 2016    PRN Medications: acetaminophen, albuterol, ondansetron (ZOFRAN) IV, ondansetron   Past Medical History  Diagnosis Date  . Diabetes mellitus, type II   . Hyperlipidemia     Lipid profile in 02/2012:135, 227, 41, 49  . Coronary atherosclerosis of native coronary artery     a. CABG x 4 in 1989 (VG->OM1->OM2, VG->RCA, LIMA->LAD), b. 05/2010: DES to VG-OM1/OM2, DES to distal LCx. c.  NSTEMI in 04/2011 - TO distal LCX stent and VG->OM2. d. 02/2012 NSTEMI DES to VG-OM1/continuation to OM2 occluded. e. inferior STEMI s/p DES to SVG-RAMUS 06/2012. f. inferolat STEMI 09/2012 s/p DES to SVG-interm; g. Lex MV (11/14):  EF 35%, inf-lat scar with small peri-infarct ischemia  . Essential hypertension, benign   . Osteoarthritis   . History of stroke   . History of pneumonia   . Cervical vertebral fracture   . Chronic back pain   . Benign prostatic hypertrophy     History of urinary retention  . Peptic ulcer disease   . Gastroesophageal reflux disease   . Ischemic cardiomyopathy     LVEF 45-50%; Echo (04/2013):  mild LVH, EF 50%, Gr 1 DD, inf-lat HK, inf-septal and ant-lat HK, Tr MR, mild LAE    Past Surgical History  Procedure Laterality Date  . Tonsillectomy    . Coronary artery bypass graft      Family History  Problem Relation Age of Onset  . Early death      Parents died young  . Appendicitis Mother     Pt was 80 year old  . Heart attack Father 47    Social History Jeffrey [name] reports that he quit smoking about 52 years ago. His smoking use included Cigarettes. He has a 20 pack-year smoking history. His smokeless tobacco use includes Chew. Jeffrey [name] reports that he does not drink alcohol.  Review of Systems Otherwise reviewed and negative except as outlined.  Physical Examination Blood pressure 94/51, pulse 54, temperature 98.1 F (36.7 C), temperature source Oral, resp. rate 18, height 5\' 10"  (1.778 m), weight 177 lb 0.5 oz (80.3 kg), SpO2 92.00%.  Intake/Output Summary (Last 24 hours) at 08/29/13 0802 Last data filed at 08/29/13 0000  Gross per 24 hour  Intake    360 ml  Output    131 ml  Net    229 ml    Telemetry: NSR, sinus bradycardia.   HEENT: Conjunctiva and lids normal, oropharynx clear with moist mucosa. Ill appearing. Neck: Supple, no elevated JVP or carotid bruits, no thyromegaly. Lungs: Expiratory wheezes, no coughing, diminished in the  bases. Wearing O2.  Cardiac: Regular rate and rhythm, bradycardic, no S3 or significant systolic murmur, no pericardial rub. Abdomen: Soft, nontender, no hepatomegaly, bowel sounds present, no guarding or rebound. Extremities: No pitting edema, distal pulses 2+. Skin: Warm and dry.Pale. Musculoskeletal: No kyphosis. Neuropsychiatric: Alert and oriented x3, affect grossly appropriate.  Prior Cardiac Testing/Procedures 1. Cardiac Cath 10/2012 1. Severe three-vessel obstructive coronary disease.  2. The saphenous vein graft to the ramus intermediate branch is patent.  3. Patent LIMA graft to the LAD.  4. Occluded saphenous vein graft to the obtuse marginal vessels.  5. Good left ventricular function.  Recommendations: There are no new lesions or recurrent stenosis to explain this patient's chest pain symptoms. I would continue medical management.  2. Echocardiogram; 05/02/2013 Left ventricle: Wall thickness was increased in a pattern of mild LVH. Systolic function was low normal. The estimated ejection fraction is approximately 50%. There was an increased relative contribution of atrial contraction to ventricular filling. Doppler parameters are consistent with abnormal left ventricular relaxation (grade 1 diastolic dysfunction). Doppler parameters are consistent with high ventricular filling pressure. - Regional wall motion abnormality: Moderate hypokinesis of the basal-mid inferolateral myocardium; mild hypokinesis of the basal inferoseptal and basal-mid anterolateral myocardium. - Aortic valve: Trileaflet; mildly thickened, mildly calcified leaflets. There was no stenosis. - Mitral valve: Calcified annulus. Trivial regurgitation. - Left atrium: The atrium was mildly dilated.   Lab Results  Basic Metabolic Panel:  Recent Labs Lab 08/26/13 2255 08/27/13 0238 08/27/13 0616 08/27/13 0759 08/29/13 0521  NA 134*  --  136* 135* 134*  K 4.6  --  4.7 4.7 3.9  CL 98  --  100 98  102  CO2 22  --  24 23 19   GLUCOSE 204*  --  246* 258* 202*  BUN 22  --  24* 24* 14  CREATININE 1.21 1.10 1.04 1.06 0.91  CALCIUM 9.1  --  8.5 8.1* 7.8*  PHOS  --   --   --  2.9  --     Liver Function Tests:  Recent Labs Lab 08/26/13 2255 08/27/13 0616 08/27/13 0759  AST 23 20  --   ALT 24 22  --   ALKPHOS 78 75  --   BILITOT 0.3 0.5  --   PROT 7.3 6.6  --   ALBUMIN 4.1 3.6 3.5    CBC:  Recent Labs Lab 08/26/13 2255 08/27/13 0238 08/27/13 0616 08/29/13 0521  WBC 11.2* 10.3 10.0 10.0  NEUTROABS 10.1*  --   --   --   HGB 14.4 14.1 14.5 12.5*  HCT 41.6 41.2 43.0 36.6*  MCV 97.0 97.4 98.4 98.1  PLT 151 142* 149* 125*    Cardiac Enzymes:  Recent Labs Lab 08/27/13 1404 08/27/13 1514 08/27/13 2155  08/28/13 0818 08/28/13 1427  CKTOTAL  --   --   --  294*  --   CKMB  --   --   --  15.9*  --   TROPONINI 0.64* 0.74* 1.25* 2.06* 1.79*     Radiology: IMPRESSION: No active cardiopulmonary disease. Interval improvement in pulmonary venous hypertension compared to 08/17/2013.    ECG: NSR with new T-wave inversion in the inferior leads, with old inversion in the lateral leads.    Impression and Recommendations  1. NSTEMI-Type II: One week of nausea, vomiting, diarrhea and abdominal pain. Some associated chest discomfort with shortness of breath. Likely from demand ischemia in the setting of fluid loss, with known multivessel CAD. Will continue him on current medication regimen. Repeat limited echo to evaluate change in LV fx, compared to [place] of last year.   2. CAD: Severe native vessel, with most recent cath in April of 2014. LIMA to LAD is patent with right coronary artery  small and diffusely diseased up to 90% throughout the proximal vessel. It is occluded in the mid vessel. There are bridging right to right collaterals as well as left to right collaterals.   Will continue coreg, ASA, statin, ACE inhibitor and Brilinta. He is chest pain free, but  continues to complain of overall weakness.   3. C-diff Positive Stool: Gastritis associated with symptoms of abdominal pain, NVD, and weakness. He continues to have loose stool. Being treated IV abx and IV fluids.   4. Diabetes: On sliding scale insulin.   5.  Bronchitis: Continues to have wheezing. Wearing O2. NO evidence of CHF. On steroid inhalers.      Signed: Phill Myron. Purcell Nails NP [facility] 08/29/2013, 8:02 AM Co-Sign MD

## 2013-08-29 NOTE — Plan of Care (Signed)
Pt complaining on SOB - pulse ox was 93% on 3L.  Called Resp to get PRN neb treatment.

## 2013-08-29 NOTE — Clinical Social Work Psychosocial (Signed)
Clinical Social Work Department BRIEF PSYCHOSOCIAL ASSESSMENT 08/29/2013  Patient:  Jeffrey [name], Jeffrey [name]     Account Number:  [id number]     Admit date:  08/26/2013  Clinical Social Worker:  Wyatt Haste  Date/Time:  08/29/2013 01:30 PM  Referred by:  Care Management  Date Referred:  08/29/2013 Referred for  SNF Placement   Other Referral:   Interview type:  Patient Other interview type:    PSYCHOSOCIAL DATA Living Status:  ALONE Admitted from facility:   Level of care:   Primary support name:  Arlene Primary support relationship to patient:  CHILD, ADULT Degree of support available:   adequate    CURRENT CONCERNS Current Concerns  Post-Acute Placement   Other Concerns:    SOCIAL WORK ASSESSMENT / PLAN CSW met with pt at bedside following recommendation for PT for SNF. Pt alert and oriented and reports he lives alone in an apartment. His daughter lives in the [place] by his [place] and he states that she is unable to drive due to vision problems. Pt admitted with Cdiff. He reports that for the past 3-4 weeks he has felt very weak. At baseline, pt is independent and still drives. He does have a walker and BSC at home if needed, but doesn't generally use. PT assessed pt today and recommendation is for SNF. CSW discussed placement process and pt is agreeable. He has been to [facility] in the past and requests to go there again if possible. Aware of Medicare coverage/criteria. SNF list left in room. Pt requested SNF when CM met with him and he appears to be very willing to go.   Assessment/plan status:  Psychosocial Support/Ongoing Assessment of Needs Other assessment/ plan:   Information/referral to community resources:   SNF list    PATIENT'S/FAMILY'S RESPONSE TO PLAN OF CARE: Pt reports positive feelings regarding SNF for rehab. CSW will initiate bed search and follow up with bed offers when available.       Benay Pike, [place]

## 2013-08-29 NOTE — Evaluation (Signed)
Physical Therapy Evaluation Patient Details Name: Jeffrey [name] MRN: [id number] DOB: 1928/11/23 Today's Date: 08/29/2013 Time: 8416-6063 PT Time Calculation (min): 37 min  PT Assessment / Plan / Recommendation History of Present Illness  Pt is admitted with C diff infection.  He c/o progressive weakness over the past 3 weeks.  He has a hx of OA, CVA and DM.  He lives alone and is normally independent with ADLS  Clinical Impression   Pt  Is seen for evaluation.  He is alert and cooperative, states that he feels so weak that he is unable to care for himself.  He is now on 3 L O2 and still feels SOB.  I attempted to take his O2 sat but could not get a reading.   He is not normally on supplemental O2.  He is generally deconditioned and appears to be profoundly depressed.  He cried intermittently throughout my visit, not really able to describe why he was crying.  He was assisted to a recliner and had not been there more than 5 minutes before he was begging to get back to bed.  I convinced him to stay up in the chair for 10 more minutes.  I am recommending SNF at d/c but unless his emotional problems are addressed, I am not sure he will get much out of it.    PT Assessment  Patient needs continued PT services    Follow Up Recommendations  SNF    Does the patient have the potential to tolerate intense rehabilitation      Barriers to Discharge Decreased caregiver support      Equipment Recommendations  None recommended by PT    Recommendations for Other Services     Frequency Min 3X/week    Precautions / Restrictions Precautions Precautions: Fall Precaution Comments: C diff precautions Restrictions Weight Bearing Restrictions: No   Pertinent Vitals/Pain       Mobility  Bed Mobility Overal bed mobility: Needs Assistance Bed Mobility: Supine to Sit Supine to sit: Supervision General bed mobility comments: pt immediately asks for help getting OOB...he was encouraged to try to  help himself and was able to transfer without physical assist but this was labored Transfers Overall transfer level: Needs assistance Equipment used: Rolling walker (2 wheeled) Transfers: Sit to/from Stand Sit to Stand: Supervision Ambulation/Gait Ambulation/Gait assistance: Supervision Ambulation Distance (Feet): 10 Feet Assistive device: Rolling walker (2 wheeled) Gait Pattern/deviations: Trunk flexed Gait velocity interpretation: at or above normal speed for age/gender General Gait Details: pt on 3 L O2    Exercises     PT Diagnosis: Difficulty walking;Generalized weakness  PT Problem List: Decreased strength;Decreased activity tolerance;Decreased mobility;Cardiopulmonary status limiting activity PT Treatment Interventions: Gait training;Functional mobility training;Therapeutic exercise;Patient/family education     PT Goals(Current goals can be found in the care plan section)    Visit Information  Last PT Received On: 08/29/13 History of Present Illness: Pt is admitted with C diff infection.  He c/o progressive weakness over the past 3 weeks.  He has a hx of OA, CVA and DM.  He lives alone and is normally independent with ADLS       Prior [place] expects to be discharged to:: Skilled nursing facility Prior Function Level of Independence: Independent Communication Communication: No difficulties    Cognition  Cognition Arousal/Alertness: Awake/alert Behavior During Therapy: Flat affect (cries frequently) Overall Cognitive Status: Within Functional Limits for tasks assessed    Extremity/Trunk Assessment Lower Extremity Assessment Lower Extremity Assessment:  Generalized weakness (primarily deconditioning)   Balance Balance Overall balance assessment: No apparent balance deficits (not formally assessed)  End of Session PT - End of Session Equipment Utilized During Treatment: Gait belt Activity Tolerance: Patient limited by  fatigue Patient left: in chair;with call bell/phone within reach;with chair alarm set Nurse Communication: Mobility status  GP     Sable Feil 08/29/2013, 1:09 PM

## 2013-08-29 NOTE — Clinical Social Work Placement (Signed)
Clinical Social Work Department CLINICAL SOCIAL WORK PLACEMENT NOTE 08/29/2013  Patient:  Jeffrey [name], Jeffrey [name]  Account Number:  [id number] Admit date:  08/26/2013  Clinical Social Worker:  Benay Pike, LCSW Date/time:  08/29/2013 01:29 PM  Clinical Social Work is seeking post-discharge placement for this patient at the following level of care:   SKILLED NURSING   (*CSW will update this form in Epic as items are completed)   08/29/2013  Patient/family provided with [place] Department of Clinical Social Work's list of facilities offering this level of care within the geographic area requested by the patient (or if unable, by the patient's family).  08/29/2013  Patient/family informed of their freedom to choose among providers that offer the needed level of care, that participate in Medicare, Medicaid or managed care program needed by the patient, have an available bed and are willing to accept the patient.  08/29/2013  Patient/family informed of [facility]' ownership interest in [facility], as well as of the fact that they are under no obligation to receive care at this facility.  PASARR submitted to EDS on 08/29/2013 PASARR number received from EDS on 08/29/2013  FL2 transmitted to all facilities in geographic area requested by pt/family on  08/29/2013 FL2 transmitted to all facilities within larger geographic area on   Patient informed that his/her managed care company has contracts with or will negotiate with  certain facilities, including the following:     Patient/family informed of bed offers received:   Patient chooses bed at  Physician recommends and patient chooses bed at    Patient to be transferred to  on   Patient to be transferred to facility by   The following physician request were entered in Epic:   Additional Comments:  Benay Pike, [place]

## 2013-08-29 NOTE — Consult Note (Signed)
The patient was seen and examined, and I agree with the assessment and plan as documented above, with modifications as noted below. He has a history of CAD with CABG with and multiple interventions, with most recent stress test from 05/29/2013 showing inferior lateral fixed defect with findings suspicious for a small amount of peri-infarct ischemia. Most recent cardiac catheterization on 11/09/2012 showed patent LIMA to the LAD and patent SVG to a stented ramus intermedius, with occlusion of SVG's to obtuse marginal branches. Pt admitted with dehydration secondary to C. Diff colitis and subsequently sustained NSTEMI, likely due to demand ischemia from volume contraction and subsequent hypotension. He tells me his symptoms were nausea, vomiting, diarrhea, weakness, and abdominal pain. He denies chest pain, but does say he has felt short of breath for the past few days. His previous CXR showed interval improvement in pulmonary venous hypertension. His ECG shows old inferior infarct and T wave inversions in inferior leads and V6, which are slightly more prominent inferiorly when compared to ECG from 2/5. Also shows a nonspecific T wave abnormality. At present, I favor continued medical therapy with ASA, Brilinta, Coreg, Ranexa, Lipitor, and lisinopril. He is currently hypotensive. His most recent echocardiogram from 05/02/2013 revealed an EF of 50% with low normal LV systolic function. Given his current symptoms of shortness of breath, I would closely monitor for the development of pulmonary vascular congestion, albeit his current hypotension and diarrhea limit diuretic use.

## 2013-08-29 NOTE — Plan of Care (Signed)
Spent about 30 min with pt just discussing "life's challenges."  Asked him if he'd like to talk with a pastor or have me call someone else?  He declined talking with anyone else at this time but appreciated being listened to.  I did reiterate to pt that if he changes his mind - we'll be happy to assist in any way that we can.

## 2013-08-29 NOTE — Care Management Note (Addendum)
    Page 1 of 1   08/31/2013     1:43:47 PM   CARE MANAGEMENT NOTE 08/31/2013  Patient:  GIANFRANCO, ARAKI   Account Number:  [id number]  Date Initiated:  08/29/2013  Documentation initiated by:  Theophilus Kinds  Subjective/Objective Assessment:   Pt admitted from home with c diff. Pt lives alone but thinks he needs to go to [facility] at discharge. Pt has used [facility] in the past.     Action/Plan:   PT consult for recommendation. Will continue to follow for possible HH needs. CSW is aware of possible SNF need.   Anticipated DC Date:  09/01/2013   Anticipated DC Plan:  [place] referral  Clinical Social Worker      DC Planning Services  CM consult      Choice offered to / List presented to:             Status of service:  Completed, signed off Medicare Important Message given?  YES (If response is "NO", the following Medicare IM given date fields will be blank) Date Medicare IM given:  08/31/2013 Date Additional Medicare IM given:    Discharge Disposition:  [place]  Per UR Regulation:    If discussed at Long Length of Stay Meetings, dates discussed:    Comments:  08/31/13 [place], RN BSN CM Pt discharged to [facility] today, CSW to arrange discharge to facility.  08/29/13 [place], RN BSN CM

## 2013-08-29 NOTE — Progress Notes (Addendum)
PROGRESS NOTE  Jeffrey [name]:[id number] DOB: 08/20/1928 DOA: 08/26/2013 PCP: [name]., MD Primary Cardiologist: [name]  Summary: 80 year old man presented with nausea, vomiting, diarrhea and chest pain. Admitted for further evaluation of chest pain, diagnosed with acute C. difficile colitis.  Assessment/Plan: 1. C. difficile colitis. 5 stools last 24 hours. Seems to be improving. Continue empiric Flagyl. 2. NSTEMI type 2. Thought to be secondary to demand ischemia in the setting of fluid loss with known multivessel coronary artery disease. Management per cardiology. 3. Coronary artery disease. Continue Brilinta, ASA, Lipitor, Coreg. 4. DM type 2. Capillary blood sugars stable. Hold metformin while hospitalized. Continue sliding scale insulin. 5. Thrombocytopenia. Significance unclear. Repeat CBC in the morning. Intermittent borderline thrombocytopenia has been seen in the past.   Continue Flagyl.  Continue cardiac medications, followup echocardiogram per cardiology.   Wean oxygen.  CBC in AM  Code Status: full code DVT prophylaxis: Lovenox Family Communication: none present Disposition Plan: pending  Jeffrey Hodgkins, MD  [facility]  Pager [phone] If 7PM-7AM, please contact night-coverage at [email or web address], password [facility] 08/29/2013, 12:47 PM  LOS: 3 days   Consultants:  Cardiology  Procedures:    Antibiotics:  Metronidazole 2/14 >>   HPI/Subjective: Feels weak. Some diarrhea only with movement. No chest pain. Some shortness of breath.  Objective: Filed Vitals:   08/28/13 2201 08/29/13 0419 08/29/13 0455 08/29/13 0850  BP: 104/42  94/51 92/64  Pulse: 85 100 54   Temp: 98.1 F (36.7 C)  98.1 F (36.7 C)   TempSrc: Oral  Oral   Resp: 20 16 18    Height:      Weight:      SpO2: 98% 91% 92%     Intake/Output Summary (Last 24 hours) at 08/29/13 1247 Last data filed at 08/29/13 0000  Gross per 24 hour  Intake    240 ml    Output    129 ml  Net    111 ml     Filed Weights   08/26/13 2205 08/27/13 0228  Weight: 81.647 kg (180 lb) 80.3 kg (177 lb 0.5 oz)    Exam:   Afebrile, vital signs stable.  Gen. Appears calm and comfortable. Speech fluent and clear. Appears weak.  Cardiovascular: Regular rate and rhythm. No murmur, rub or gallop.  Respiratory clear to auscultation bilaterally. No wheezes, rales or rhonchi. Normal respiratory effort.  Psychiatric. Grossly normal mood and affect. Speech fluent and appropriate.  Data Reviewed:  Capillary blood sugars stable.  Basic metabolic panel notable for sodium 134   Last troponin yesterday 1.79  Platelet count 125, significance unclear.   Scheduled Meds: . aspirin EC  81 mg Oral Daily  . atorvastatin  40 mg Oral q1800  . carvedilol  3.125 mg Oral BID WC  . enoxaparin (LOVENOX) injection  40 mg Subcutaneous Q24H  . insulin aspart  0-9 Units Subcutaneous TID WC  . lisinopril  5 mg Oral Daily  . metroNIDAZOLE  500 mg Oral 3 times per day  . pantoprazole  40 mg Oral Daily  . ranolazine  500 mg Oral BID  . Ticagrelor  90 mg Oral BID   Continuous Infusions: . sodium chloride 75 mL/hr at 08/28/13 2016    Principal Problem:   C. difficile diarrhea Active Problems:   Hyperlipidemia   Diabetes mellitus, type II   Hypertension   Coronary atherosclerosis of native coronary artery   Chest pain   Nausea & vomiting   Diarrhea   NSTEMI (non-ST elevated  myocardial infarction)   Time spent 20 minutes

## 2013-08-30 DIAGNOSIS — Z9119 Patient's noncompliance with other medical treatment and regimen: Secondary | ICD-10-CM

## 2013-08-30 DIAGNOSIS — Z91199 Patient's noncompliance with other medical treatment and regimen due to unspecified reason: Secondary | ICD-10-CM

## 2013-08-30 DIAGNOSIS — R0602 Shortness of breath: Secondary | ICD-10-CM

## 2013-08-30 DIAGNOSIS — R012 Other cardiac sounds: Secondary | ICD-10-CM

## 2013-08-30 DIAGNOSIS — I7090 Unspecified atherosclerosis: Secondary | ICD-10-CM

## 2013-08-30 DIAGNOSIS — I2510 Atherosclerotic heart disease of native coronary artery without angina pectoris: Secondary | ICD-10-CM

## 2013-08-30 DIAGNOSIS — I25810 Atherosclerosis of coronary artery bypass graft(s) without angina pectoris: Secondary | ICD-10-CM

## 2013-08-30 LAB — CBC
HCT: 37.7 % — ABNORMAL LOW (ref 39.0–52.0)
Hemoglobin: 12.9 g/dL — ABNORMAL LOW (ref 13.0–17.0)
MCH: 33.2 pg (ref 26.0–34.0)
MCHC: 34.2 g/dL (ref 30.0–36.0)
MCV: 97.2 fL (ref 78.0–100.0)
PLATELETS: 147 10*3/uL — AB (ref 150–400)
RBC: 3.88 MIL/uL — AB (ref 4.22–5.81)
RDW: 13.9 % (ref 11.5–15.5)
WBC: 9.8 10*3/uL (ref 4.0–10.5)

## 2013-08-30 LAB — GLUCOSE, CAPILLARY
Glucose-Capillary: 203 mg/dL — ABNORMAL HIGH (ref 70–99)
Glucose-Capillary: 200 mg/dL — ABNORMAL HIGH (ref 70–99)
Glucose-Capillary: 191 mg/dL — ABNORMAL HIGH (ref 70–99)
Glucose-Capillary: 212 mg/dL — ABNORMAL HIGH (ref 70–99)

## 2013-08-30 LAB — BASIC METABOLIC PANEL
BUN: 20 mg/dL (ref 6–23)
CALCIUM: 8.4 mg/dL (ref 8.4–10.5)
CO2: 20 meq/L (ref 19–32)
CREATININE: 0.95 mg/dL (ref 0.50–1.35)
Chloride: 100 mEq/L (ref 96–112)
GFR calc non Af Amer: 75 mL/min — ABNORMAL LOW (ref >90)
GFR, EST AFRICAN AMERICAN: 87 mL/min — AB (ref >90)
Glucose, Bld: 257 mg/dL — ABNORMAL HIGH (ref 70–99)
Potassium: 4.1 mEq/L (ref 3.7–5.3)
SODIUM: 134 meq/L — AB (ref 137–147)

## 2013-08-30 LAB — MAGNESIUM: Magnesium: 2 mg/dL (ref 1.5–2.5)

## 2013-08-30 MED ORDER — ALBUTEROL SULFATE (2.5 MG/3ML) 0.083% IN NEBU
2.5000 mg | INHALATION_SOLUTION | Freq: Four times a day (QID) | RESPIRATORY_TRACT | Status: DC
  Administered 2013-08-30 – 2013-09-01 (×7): 2.5 mg via RESPIRATORY_TRACT
  Filled 2013-08-30 (×6): qty 3

## 2013-08-30 MED ORDER — ALBUTEROL SULFATE (2.5 MG/3ML) 0.083% IN NEBU
2.5000 mg | INHALATION_SOLUTION | RESPIRATORY_TRACT | Status: DC | PRN
  Administered 2013-08-31: 2.5 mg via RESPIRATORY_TRACT
  Filled 2013-08-30 (×2): qty 3

## 2013-08-30 NOTE — Clinical Social Work Placement (Signed)
Clinical Social Work Department CLINICAL SOCIAL WORK PLACEMENT NOTE 08/30/2013  Patient:  Jeffrey [name], Jeffrey [name]  Account Number:  [id number] Admit date:  08/26/2013  Clinical Social Worker:  Benay Pike, LCSW  Date/time:  08/29/2013 01:29 PM  Clinical Social Work is seeking post-discharge placement for this patient at the following level of care:   SKILLED NURSING   (*CSW will update this form in Epic as items are completed)   08/29/2013  Patient/family provided with [place] Department of Clinical Social Work's list of facilities offering this level of care within the geographic area requested by the patient (or if unable, by the patient's family).  08/29/2013  Patient/family informed of their freedom to choose among providers that offer the needed level of care, that participate in Medicare, Medicaid or managed care program needed by the patient, have an available bed and are willing to accept the patient.  08/29/2013  Patient/family informed of [facility]' ownership interest in [facility], as well as of the fact that they are under no obligation to receive care at this facility.  PASARR submitted to EDS on 08/29/2013 PASARR number received from EDS on 08/29/2013  FL2 transmitted to all facilities in geographic area requested by pt/family on  08/29/2013 FL2 transmitted to all facilities within larger geographic area on   Patient informed that his/her managed care company has contracts with or will negotiate with  certain facilities, including the following:     Patient/family informed of bed offers received:  08/30/2013 Patient chooses bed at [place] Physician recommends and patient chooses bed at  [place]  Patient to be transferred to  on   Patient to be transferred to facility by   The following physician request were entered in Epic:   Additional Comments:  Benay Pike, [place]

## 2013-08-30 NOTE — Clinical Social Work Note (Signed)
CSW presented bed offer at [facility] which pt accepts. Debbie at [facility] aware. CSW offered to call pt's daughter to notify her as well and pt agrees. Called both numbers on chart for Ar[place], but both were unable to take voicemail. Possible d/c tomorrow per MD.   Benay Pike, [place]

## 2013-08-30 NOTE — Progress Notes (Addendum)
[facility]                                                                              Patient Demographics  Jeffrey [name], is a 80 y.o. male, DOB - October 03, 1928, UJW:[id number]  Admit date - 08/26/2013   Admitting Physician Oswald Hillock, MD  Outpatient Primary MD for the patient is [name]., MD  LOS - 4   Chief Complaint  Patient presents with  . Chest Pain  . Abdominal Pain  . Emesis      Summary:  80 year old man presented with nausea, vomiting, diarrhea and chest pain. Admitted for further evaluation of chest pain, diagnosed with acute C. difficile colitis.   Assessment & Plan   Abdominal pain with Nausea/vomiting/Diarrhea secondary to C Diff Colitis -Cdiff PCR was positive -Diarrhea has improved, Nausea/vomiting have subsided -Continue flagyl 500mg  PO q8hrs  Elevated Troponin possibly Demand Ischemia -Currently chest pain free for >24hrs, felt his CP was due to vomiting -Patient does have a history of CAD, EKG shows nonspecific ST-T wave changes, more pronounced TWI -Spoke with Dr. Haroldine Laws on 2/14 when trop 0.6, recommended to continue to trend -Troponins trending upward, 2.06 -Spoke with Dr. Bronson Ing 2/15 and consulted on 2/16 -Troponin trending downward -Continue Brilinta, aspirin, lipitor, coreg, lisinopril  Diabetes Mellitus  -Metformin held -Continue insulin sliding scale with CBG monitoring  Hypertension Currently controlled. Will continue Coreg and lisinopril  Hyperlipidemia Continue Lipitor.  Thrombocytopenia -Has had in the past -unknown etiology -Will continue to monitor  Code Status: Full   Family Communication: None at bedside.  Disposition Plan: Admitted, likely discharge to [facility] when stable. Pending further recommendations from cardiology  Time Spent in minutes   25 minutes  Procedures None  Consults   Cardiology  DVT Prophylaxis  Lovenox   Lab Results  Component Value Date   PLT 147* 08/30/2013     Medications  Scheduled Meds: . aspirin EC  81 mg Oral Daily  . atorvastatin  40 mg Oral q1800  . carvedilol  3.125 mg Oral BID WC  . enoxaparin (LOVENOX) injection  40 mg Subcutaneous Q24H  . insulin aspart  0-9 Units Subcutaneous TID WC  . lisinopril  5 mg Oral Daily  . metroNIDAZOLE  500 mg Oral 3 times per day  . pantoprazole  40 mg Oral Daily  . ranolazine  500 mg Oral BID  . Ticagrelor  90 mg Oral BID   Continuous Infusions:   PRN Meds:.acetaminophen, albuterol, ondansetron (ZOFRAN) IV, ondansetron  Antibiotics    Anti-infectives   Start     Dose/Rate Route Frequency Ordered Stop   08/27/13 1400  metroNIDAZOLE (FLAGYL) tablet 500 mg     500 mg Oral 3 times per day 08/27/13 1058 09/10/13 1359      Subjective:   Jeffrey [name] seen and examined today.  Patient continues to have diarrhea, however, improved. Denies further nausea/vomiting.  He denies chest pain or SOB, however, still requiring oxygen via nasal canula.  Objective:   Filed Vitals:   08/30/13 0330 08/30/13 0533 08/30/13 0535 08/30/13 0956  BP:  123/63    Pulse:  52 88   Temp:  97.3 F (36.3 C)  TempSrc:  Oral    Resp:  18    Height:      Weight:      SpO2: 94% 95%  98%    Wt Readings from Last 3 Encounters:  08/27/13 80.3 kg (177 lb 0.5 oz)  08/18/13 81.647 kg (180 lb)  08/17/13 81.647 kg (180 lb)     Intake/Output Summary (Last 24 hours) at 08/30/13 1026 Last data filed at 08/29/13 1700  Gross per 24 hour  Intake    480 ml  Output    200 ml  Net    280 ml    Exam  General: Well developed, well nourished, NAD, appears stated age  HEENT: NCAT, mucous membranes moist.   Neck: Supple, no JVD, no masses  Cardiovascular: S1 S2 auscultated, no rubs, murmurs or gallops. Regular rate and rhythm.  Respiratory: Clear to auscultation bilaterally with equal chest rise  Abdomen: Soft, mild epigastric tenderness, nondistended, + bowel sounds  Extremities: warm dry without cyanosis  clubbing or edema  Neuro: AAOx3, no focal deficits   Skin: Without rashes exudates or nodules  Psych: Normal affect and demeanor with intact judgement and insight  Data Review   Micro Results Recent Results (from the past 240 hour(s))  CLOSTRIDIUM DIFFICILE BY PCR     Status: Abnormal   Collection Time    08/27/13  9:11 AM      Result Value Ref Range Status   C difficile by pcr POSITIVE (*) NEGATIVE Final   Comment: CRITICAL RESULT CALLED TO, READ BACK BY AND VERIFIED WITH:     BULLINS M. AT 1010A ON [id number] BY THOMPSON S.    Radiology Reports Dg Chest 2 View  08/27/2013   CLINICAL DATA:  Chest pain, short of breath, weakness and vomiting  EXAM: CHEST  2 VIEW  COMPARISON:  Prior chest x-ray 03/06/2014  FINDINGS: Stable cardiomegaly. Patient is status post median sternotomy with evidence of prior multivessel CABG including LIMA bypass. The upper-most sternal wire is fractured but unchanged in position compared to prior. Previously identified venous hypertension has improved and is no longer present. No focal airspace consolidation. Mild central bronchitic changes and pulmonary hyper expansion are stable. No acute osseous abnormality.  IMPRESSION: No active cardiopulmonary disease. Interval improvement in pulmonary venous hypertension compared to 08/17/2013.   Electronically Signed   By: Jacqulynn Cadet M.D.   On: 08/27/2013 00:17   Dg Chest Portable 1 View  08/17/2013   CLINICAL DATA:  Chest pain.  Diabetes.  Ischemic cardiomyopathy.  EXAM: PORTABLE CHEST - 1 VIEW  COMPARISON:  DG CHEST 2 VIEW dated 06/16/2013; DG CHEST 1V PORT dated 05/28/2013; DG CHEST 2 VIEW dated 05/21/2013; CT ANGIO CHEST W/CM &/OR WO/CM dated 05/23/2011  FINDINGS: The cardiomegaly noted with prior CABG. Prominent upper zone pulmonary vasculature suggest pulmonary venous hypertension. No overt edema. No pleural effusion.  Atherosclerotic calcification of the aortic arch.  IMPRESSION: 1. Cardiomegaly with pulmonary venous  hypertension, but without overt edema.   Electronically Signed   By: Sherryl Barters M.D.   On: 08/17/2013 20:58    CBC  Recent Labs Lab 08/26/13 2255 08/27/13 0238 08/27/13 0616 08/29/13 0521 08/30/13 0546  WBC 11.2* 10.3 10.0 10.0 9.8  HGB 14.4 14.1 14.5 12.5* 12.9*  HCT 41.6 41.2 43.0 36.6* 37.7*  PLT 151 142* 149* 125* 147*  MCV 97.0 97.4 98.4 98.1 97.2  MCH 33.6 33.3 33.2 33.5 33.2  MCHC 34.6 34.2 33.7 34.2 34.2  RDW 13.7 13.9 13.9 14.0 13.9  LYMPHSABS 0.4*  --   --   --   --   MONOABS 0.6  --   --   --   --   EOSABS 0.2  --   --   --   --   BASOSABS 0.0  --   --   --   --     Chemistries   Recent Labs Lab 08/26/13 2255 08/27/13 0238 08/27/13 0616 08/27/13 0759 08/29/13 0521 08/30/13 0546  NA 134*  --  136* 135* 134* 134*  K 4.6  --  4.7 4.7 3.9 4.1  CL 98  --  100 98 102 100  CO2 22  --  24 23 19 20   GLUCOSE 204*  --  246* 258* 202* 257*  BUN 22  --  24* 24* 14 20  CREATININE 1.21 1.10 1.04 1.06 0.91 0.95  CALCIUM 9.1  --  8.5 8.1* 7.8* 8.4  AST 23  --  20  --   --   --   ALT 24  --  22  --   --   --   ALKPHOS 78  --  75  --   --   --   BILITOT 0.3  --  0.5  --   --   --    ------------------------------------------------------------------------------------------------------------------ estimated creatinine clearance is 61.9 ml/min (by C-G formula based on Cr of 0.95). ------------------------------------------------------------------------------------------------------------------ No results found for this basename: HGBA1C,  in the last 72 hours ------------------------------------------------------------------------------------------------------------------ No results found for this basename: CHOL, HDL, LDLCALC, TRIG, CHOLHDL, LDLDIRECT,  in the last 72 hours ------------------------------------------------------------------------------------------------------------------ No results found for this basename: TSH, T4TOTAL, FREET3, T3FREE, THYROIDAB,  in  the last 72 hours ------------------------------------------------------------------------------------------------------------------ No results found for this basename: VITAMINB12, FOLATE, FERRITIN, TIBC, IRON, RETICCTPCT,  in the last 72 hours  Coagulation profile No results found for this basename: INR, PROTIME,  in the last 168 hours  No results found for this basename: DDIMER,  in the last 72 hours  Cardiac Enzymes  Recent Labs Lab 08/27/13 2155 08/28/13 0818 08/28/13 1427  CKMB  --  15.9*  --   TROPONINI 1.25* 2.06* 1.79*   ------------------------------------------------------------------------------------------------------------------ No components found with this basename: POCBNP,     Dalayah Deahl D.O. on 08/30/2013 at 10:26 AM  Between 7am to 7pm - Pager - [phone]  After 7pm go to [email or web address] - password [email or web address]  And look for the night coverage person covering for me after hours  [facility] Office  [phone]

## 2013-08-30 NOTE — Progress Notes (Signed)
SUBJECTIVE: Pt felt weak when walking with PT, but denies chest pain and shortness of breath. Also denies palpitations.     Intake/Output Summary (Last 24 hours) at 08/30/13 1529 Last data filed at 08/29/13 1700  Gross per 24 hour  Intake    240 ml  Output    200 ml  Net     40 ml    Current Facility-Administered Medications  Medication Dose Route Frequency Provider Last Rate Last Dose  . acetaminophen (TYLENOL) tablet 650 mg  650 mg Oral Q6H PRN Dianne Dun, NP   650 mg at 08/29/13 0851  . albuterol (PROVENTIL) (2.5 MG/3ML) 0.083% nebulizer solution 2.5 mg  2.5 mg Inhalation Q6H PRN Dianne Dun, NP   2.5 mg at 08/30/13 0955  . aspirin EC tablet 81 mg  81 mg Oral Daily Oswald Hillock, MD   81 mg at 08/30/13 1003  . atorvastatin (LIPITOR) tablet 40 mg  40 mg Oral q1800 Oswald Hillock, MD   40 mg at 08/29/13 1741  . carvedilol (COREG) tablet 3.125 mg  3.125 mg Oral BID WC Oswald Hillock, MD   3.125 mg at 08/30/13 0834  . enoxaparin (LOVENOX) injection 40 mg  40 mg Subcutaneous Q24H Oswald Hillock, MD   40 mg at 08/30/13 1009  . insulin aspart (novoLOG) injection 0-9 Units  0-9 Units Subcutaneous TID WC Oswald Hillock, MD   2 Units at 08/30/13 1216  . lisinopril (PRINIVIL,ZESTRIL) tablet 5 mg  5 mg Oral Daily Maryann Mikhail, DO   5 mg at 08/30/13 1003  . metroNIDAZOLE (FLAGYL) tablet 500 mg  500 mg Oral 3 times per day Cristal Ford, DO   500 mg at 08/30/13 1321  . ondansetron (ZOFRAN) tablet 4 mg  4 mg Oral Q6H PRN Oswald Hillock, MD       Or  . ondansetron (ZOFRAN) injection 4 mg  4 mg Intravenous Q6H PRN Oswald Hillock, MD   4 mg at 08/27/13 2141  . pantoprazole (PROTONIX) EC tablet 40 mg  40 mg Oral Daily Oswald Hillock, MD   40 mg at 08/30/13 1004  . ranolazine (RANEXA) 12 hr tablet 500 mg  500 mg Oral BID Oswald Hillock, MD   500 mg at 08/30/13 1002  . Ticagrelor (BRILINTA) tablet 90 mg  90 mg Oral BID Oswald Hillock, MD   90 mg at 08/30/13 1003    Filed Vitals:     08/30/13 0533 08/30/13 0535 08/30/13 0956 08/30/13 1504  BP: 123/63   128/70  Pulse: 52 88  85  Temp: 97.3 F (36.3 C)   98.2 F (36.8 C)  TempSrc: Oral   Oral  Resp: 18   18  Height:      Weight:      SpO2: 95%  98% 97%    PHYSICAL EXAM General: NAD Neck: No JVD, no thyromegaly or thyroid nodule.  Lungs: Clear to auscultation bilaterally with normal respiratory effort. CV: Nondisplaced PMI.  Regular rhythm, normal S1/S2, no S3/S4, no murmur.  No pretibial edema.  No carotid bruit.  Normal pedal pulses.  Abdomen: Soft,no hepatosplenomegaly, no distention.  Neurologic: Alert and oriented x 3.  Psych: Normal affect. Extremities: No clubbing or cyanosis.   TELEMETRY: Reviewed telemetry pt in normal sinus rhythm with PVC's and episodic ventricular bigeminy.  LABS: Basic Metabolic Panel:  Recent Labs  08/29/13 0521 08/30/13 0546  NA 134* 134*  K 3.9  4.1  CL 102 100  CO2 19 20  GLUCOSE 202* 257*  BUN 14 20  CREATININE 0.91 0.95  CALCIUM 7.8* 8.4   Liver Function Tests: No results found for this basename: AST, ALT, ALKPHOS, BILITOT, PROT, ALBUMIN,  in the last 72 hours No results found for this basename: LIPASE, AMYLASE,  in the last 72 hours CBC:  Recent Labs  08/29/13 0521 08/30/13 0546  WBC 10.0 9.8  HGB 12.5* 12.9*  HCT 36.6* 37.7*  MCV 98.1 97.2  PLT 125* 147*   Cardiac Enzymes:  Recent Labs  08/27/13 2155 08/28/13 0818 08/28/13 1427  CKTOTAL  --  294*  --   CKMB  --  15.9*  --   TROPONINI 1.25* 2.06* 1.79*   BNP: No components found with this basename: POCBNP,  D-Dimer: No results found for this basename: DDIMER,  in the last 72 hours Hemoglobin A1C: No results found for this basename: HGBA1C,  in the last 72 hours Fasting Lipid Panel: No results found for this basename: CHOL, HDL, LDLCALC, TRIG, CHOLHDL, LDLDIRECT,  in the last 72 hours Thyroid Function Tests: No results found for this basename: TSH, T4TOTAL, FREET3, T3FREE, THYROIDAB,   in the last 72 hours Anemia Panel: No results found for this basename: VITAMINB12, FOLATE, FERRITIN, TIBC, IRON, RETICCTPCT,  in the last 72 hours  RADIOLOGY: Dg Chest 2 View  08/27/2013   CLINICAL DATA:  Chest pain, short of breath, weakness and vomiting  EXAM: CHEST  2 VIEW  COMPARISON:  Prior chest x-ray 03/06/2014  FINDINGS: Stable cardiomegaly. Patient is status post median sternotomy with evidence of prior multivessel CABG including LIMA bypass. The upper-most sternal wire is fractured but unchanged in position compared to prior. Previously identified venous hypertension has improved and is no longer present. No focal airspace consolidation. Mild central bronchitic changes and pulmonary hyper expansion are stable. No acute osseous abnormality.  IMPRESSION: No active cardiopulmonary disease. Interval improvement in pulmonary venous hypertension compared to 08/17/2013.   Electronically Signed   By: Jacqulynn Cadet M.D.   On: 08/27/2013 00:17   Dg Chest Portable 1 View  08/17/2013   CLINICAL DATA:  Chest pain.  Diabetes.  Ischemic cardiomyopathy.  EXAM: PORTABLE CHEST - 1 VIEW  COMPARISON:  DG CHEST 2 VIEW dated 06/16/2013; DG CHEST 1V PORT dated 05/28/2013; DG CHEST 2 VIEW dated 05/21/2013; CT ANGIO CHEST W/CM &/OR WO/CM dated 05/23/2011  FINDINGS: The cardiomegaly noted with prior CABG. Prominent upper zone pulmonary vasculature suggest pulmonary venous hypertension. No overt edema. No pleural effusion.  Atherosclerotic calcification of the aortic arch.  IMPRESSION: 1. Cardiomegaly with pulmonary venous hypertension, but without overt edema.   Electronically Signed   By: Sherryl Barters M.D.   On: 08/17/2013 20:58      ASSESSMENT AND PLAN: Pt admitted with dehydration secondary to C. Diff colitis and subsequently sustained NSTEMI, likely due to demand ischemia from volume contraction and subsequent hypotension.  Currently denies chest pain and palpitations. His SOB improved with breathing  treatments.  He has a history of CAD with CABG with and multiple interventions, with most recent stress test from 05/29/2013 showing inferior lateral fixed defect with findings suspicious for a small amount of peri-infarct ischemia.  Most recent cardiac catheterization on 11/09/2012 showed patent LIMA to the LAD and patent SVG to a stented ramus intermedius, with occlusion of SVG's to obtuse marginal branches.  His previous CXR showed interval improvement in pulmonary venous hypertension.  His ECG shows old inferior infarct and  T wave inversions in inferior leads and V6, which are slightly more prominent inferiorly when compared to ECG from 2/5. Also shows a nonspecific T wave abnormality.  At present, I favor continued medical therapy with ASA, Brilinta, Coreg, Ranexa, Lipitor, and lisinopril at current doses. His BP has normalized with IV fluids. His most recent echocardiogram from 05/02/2013 revealed an EF of 50% with low normal LV systolic function.  Continue to closely monitor for the development of pulmonary vascular congestion. Given ventricular bigeminy, will check magnesium level. K normal at 4.1.   [name], M.D., F.A.C.C.

## 2013-08-30 NOTE — Progress Notes (Signed)
Physical Therapy Treatment Patient Details Name: Jeffrey [name] MRN: [id number] DOB: 1929-07-11 Today's Date: 08/30/2013 Time: 6160-7371 PT Time Calculation (min): 26 min  PT Assessment / Plan / Recommendation  PT Comments   Pt appears weak and is easily fatigued. Pt completes therex well after initial cueing and demo. Pt becomes easily short of breath with therex. Pt able to ambualte from bed to chair with supervision but states "I'm glad the chair was close. I couldn't have made it any farther". Pt was encouraged to sit up as long as possible as he would only sit up for 10' yesterday. Educated pt on benefits of getting out of bed and sitting up.   Plan Current plan remains appropriate    Pertinent Vitals/Pain 0/10    Mobility  Bed Mobility Overal bed mobility: Needs Assistance Bed Mobility: Supine to Sit Supine to sit: Min assist Transfers Overall transfer level: Needs assistance Equipment used: Rolling walker (2 wheeled) Transfers: Sit to/from Stand Sit to Stand: Supervision Ambulation/Gait Ambulation/Gait assistance: Supervision Ambulation Distance (Feet): 4 Feet (From bed to chair) Assistive device: Rolling walker (2 wheeled) Gait Pattern/deviations: Trunk flexed General Gait Details: pt on 3 L O2    Exercises General Exercises - Lower Extremity Ankle Circles/Pumps: Both;10 reps;Supine Quad Sets: Both;10 reps;Supine Gluteal Sets: Both;10 reps;Supine Hip ABduction/ADduction: Both;10 reps;Supine     Visit Information  Last PT Received On: 08/30/13    Subjective Data  "I feel so weak."  End of Session PT - End of Session Equipment Utilized During Treatment: Gait belt Activity Tolerance: Patient limited by fatigue Patient left: in chair;with call bell/phone within reach;with chair alarm set   Rachelle Hora, PTA  08/30/2013, 10:44 AM

## 2013-08-31 LAB — CBC
HEMATOCRIT: 34.6 % — AB (ref 39.0–52.0)
HEMOGLOBIN: 11.7 g/dL — AB (ref 13.0–17.0)
MCH: 32.9 pg (ref 26.0–34.0)
MCHC: 33.8 g/dL (ref 30.0–36.0)
MCV: 97.2 fL (ref 78.0–100.0)
Platelets: 153 10*3/uL (ref 150–400)
RBC: 3.56 MIL/uL — AB (ref 4.22–5.81)
RDW: 14.1 % (ref 11.5–15.5)
WBC: 9.5 10*3/uL (ref 4.0–10.5)

## 2013-08-31 LAB — URINE MICROSCOPIC-ADD ON

## 2013-08-31 LAB — GLUCOSE, CAPILLARY
GLUCOSE-CAPILLARY: 238 mg/dL — AB (ref 70–99)
GLUCOSE-CAPILLARY: 225 mg/dL — AB (ref 70–99)
GLUCOSE-CAPILLARY: 229 mg/dL — AB (ref 70–99)
Glucose-Capillary: 214 mg/dL — ABNORMAL HIGH (ref 70–99)

## 2013-08-31 LAB — BASIC METABOLIC PANEL
BUN: 24 mg/dL — AB (ref 6–23)
CALCIUM: 8.4 mg/dL (ref 8.4–10.5)
CO2: 22 meq/L (ref 19–32)
CREATININE: 0.91 mg/dL (ref 0.50–1.35)
Chloride: 99 mEq/L (ref 96–112)
GFR calc Af Amer: 89 mL/min — ABNORMAL LOW (ref >90)
GFR, EST NON AFRICAN AMERICAN: 77 mL/min — AB (ref >90)
GLUCOSE: 186 mg/dL — AB (ref 70–99)
Potassium: 3.8 mEq/L (ref 3.7–5.3)
Sodium: 134 mEq/L — ABNORMAL LOW (ref 137–147)

## 2013-08-31 LAB — URINALYSIS, ROUTINE W REFLEX MICROSCOPIC
GLUCOSE, UA: 250 mg/dL — AB
Hgb urine dipstick: NEGATIVE
Leukocytes, UA: NEGATIVE
Nitrite: POSITIVE — AB
PH: 5.5 (ref 5.0–8.0)
Specific Gravity, Urine: >1.03 — ABNORMAL HIGH (ref 1.005–1.030)
Urobilinogen, UA: 0.2 mg/dL (ref 0.0–1.0)

## 2013-08-31 MED ORDER — ZOLPIDEM TARTRATE 5 MG PO TABS
5.0000 mg | ORAL_TABLET | Freq: Once | ORAL | Status: AC
  Administered 2013-08-31: 5 mg via ORAL
  Filled 2013-08-31: qty 1

## 2013-08-31 MED ORDER — ALBUTEROL SULFATE (2.5 MG/3ML) 0.083% IN NEBU
2.5000 mg | INHALATION_SOLUTION | RESPIRATORY_TRACT | Status: DC | PRN
  Administered 2013-08-31 – 2013-09-01 (×4): 2.5 mg via RESPIRATORY_TRACT
  Filled 2013-08-31 (×5): qty 3

## 2013-08-31 MED ORDER — SODIUM CHLORIDE 0.9 % IV SOLN
INTRAVENOUS | Status: AC
  Administered 2013-08-31: 21:00:00 via INTRAVENOUS

## 2013-08-31 MED ORDER — GLUCERNA SHAKE PO LIQD
237.0000 mL | Freq: Two times a day (BID) | ORAL | Status: DC
  Administered 2013-09-01: 237 mL via ORAL

## 2013-08-31 NOTE — Progress Notes (Signed)
Inpatient Diabetes Program Recommendations  AACE/ADA: New Consensus Statement on Inpatient Glycemic Control (2013)  Target Ranges:  Prepandial:   less than 140 mg/dL      Peak postprandial:   less than 180 mg/dL (1-2 hours)      Critically ill patients:  140 - 180 mg/dL   Results for CLELAND, SIMKINS (MRN [id number]) as of 08/31/2013 10:34  Ref. Range 08/30/2013 07:40 08/30/2013 11:45 08/30/2013 17:07 08/30/2013 22:22 08/31/2013 07:43  Glucose-Capillary Latest Range: 70-99 mg/dL 203 (H) 200 (H) 191 (H) 212 (H) 214 (H)   Diabetes history: DM2 Outpatient Diabetes medications: Metformin 500mg  BID Current orders for Inpatient glycemic control: Novolog 0-9 units AC  Inpatient Diabetes Program Recommendations Correction (SSI): If not discharged today, please consider increasing Novolog correction to moderate scale.  Thanks, Barnie Alderman, RN, MSN, CCRN Diabetes Coordinator Inpatient Diabetes Program [phone] (Team Pager) [phone] (AP office) [phone] [facility] office)

## 2013-08-31 NOTE — Plan of Care (Addendum)
Pt asked to have condom cath removed.  Urine was Burgundy/brown - usually yellow.  Dr. Informed Ordered Urinalysis before DC.

## 2013-08-31 NOTE — Progress Notes (Signed)
PROGRESS NOTE  Jeffrey [name] FGH:[id number] DOB: [birth year] DOA: 08/26/2013 PCP: [name]., MD Primary Cardiologist: [name]  Summary: 80 year old man presented with nausea, vomiting, diarrhea and chest pain. Admitted for further evaluation of chest pain, diagnosed with acute C. difficile colitis. Sustained non-ST elevation MI, thought secondary to demand ischemia from volume contraction and subsequent hypotension.  Assessment/Plan: 1. C. difficile colitis. Much improved. On one stool today. Continue Flagyl. 2. NSTEMI type 2. Thought to be secondary to demand ischemia in the setting of fluid loss with known multivessel coronary artery disease. Cardiology has recommended medical therapy only. 3. Coronary artery disease. Continue Brilinta, ASA, Lipitor, Coreg, Ranexa, lisinopril. 4. DM type 2. Capillary blood sugars stable. Hold metformin while hospitalized. Continue sliding scale insulin. 5. Dehydration. Oral intake. BUN is increased.   Continue Flagyl.  Continue cardiac medications.  Wean oxygen.  IV fluids. Repeat basic metabolic panel in the morning.  Anticipate transfer to skilled nursing facility 2/19.  Code Status: full code DVT prophylaxis: Lovenox Family Communication: none present Disposition Plan: pending  Jeffrey Hodgkins, MD  [facility]  Pager [phone] If 7PM-7AM, please contact night-coverage at [email or web address], password [facility] 08/31/2013, 5:50 PM  LOS: 5 days   Consultants:  Cardiology  Physical therapy: SNF  Procedures:    Antibiotics:  Metronidazole 2/14 >>   HPI/Subjective: He feels weak but no new issues. Short of breath at times. Reported dark urine earlier. Poor oral intake. Only one stool today. Diarrhea much improved.  Objective: Filed Vitals:   08/31/13 0845 08/31/13 1108 08/31/13 1447 08/31/13 1523  BP: 97/62   109/66  Pulse:    102  Temp:    97.8 F (36.6 C)  TempSrc:      Resp:    18  Height:      Weight:       SpO2:  96% 92% 93%    Intake/Output Summary (Last 24 hours) at 08/31/13 1750 Last data filed at 08/31/13 1047  Gross per 24 hour  Intake      0 ml  Output    400 ml  Net   -400 ml     Filed Weights   08/26/13 2205 08/27/13 0228  Weight: 81.647 kg (180 lb) 80.3 kg (177 lb 0.5 oz)    Exam:   Afebrile, vital signs stable.  Gen. Appears weak but nontoxic. Somewhat anxious. Appears comfortable.  Cardiovascular regular rate and. No murmur, rub or gallop. No lower extremity edema.  Telemetry sinus rhythm.  Respiratory clear to auscultation bilaterally. No wheezes rhonchi or rales.  Data Reviewed:  Capillary blood sugars stable.  Urine concentrated.  CBC and basic metabolic panel unremarkable.  Scheduled Meds: . albuterol  2.5 mg Nebulization QID  . aspirin EC  81 mg Oral Daily  . atorvastatin  40 mg Oral q1800  . carvedilol  3.125 mg Oral BID WC  . enoxaparin (LOVENOX) injection  40 mg Subcutaneous Q24H  . feeding supplement (GLUCERNA SHAKE)  237 mL Oral BID BM  . insulin aspart  0-9 Units Subcutaneous TID WC  . lisinopril  5 mg Oral Daily  . metroNIDAZOLE  500 mg Oral 3 times per day  . pantoprazole  40 mg Oral Daily  . ranolazine  500 mg Oral BID  . Ticagrelor  90 mg Oral BID   Continuous Infusions:    Principal Problem:   C. difficile diarrhea Active Problems:   Hyperlipidemia   Diabetes mellitus, type II   Hypertension   Coronary atherosclerosis of native  coronary artery   Chest pain   Nausea & vomiting   Diarrhea   NSTEMI (non-ST elevated myocardial infarction)   Time spent 20 minutes

## 2013-08-31 NOTE — Plan of Care (Signed)
Pt complaining of diff breathing and requested PRN nebs treatment.  Resp called.

## 2013-08-31 NOTE — Progress Notes (Signed)
INITIAL NUTRITION ASSESSMENT  DOCUMENTATION CODES Per approved criteria  -Not Applicable   INTERVENTION: Glucerna Shake po BID, each supplement provides 220 kcal and 10 grams of protein  NUTRITION DIAGNOSIS: Inadequate oral intake related to decreased appetite as evidenced by PO: 25-75%.   Goal: Pt will meet >90% of estimated nutritional needs  Monitor:  PO intake, labs, weight changes, skin assessments, I/O's  Reason for Assessment: LOS, poor po intake  80 y.o. male  Admitting Dx: C. difficile diarrhea  ASSESSMENT: Pt admitted for C-Diff. Discharge disposition is to d/c to [facility] when medically stable.  Wt has been stable over the past year. Noted UBW of 180#. Noted slight decrease in wt (-3# (1.7%) x 1 wee, which is not clinically significant.  Appetite has been variable. Noted PO's between 25-75%.  Pt does not meet criteria for malnutrition at this time, but would benefit from a nutritional supplement due to variable PO intake.   Height: Ht Readings from Last 1 Encounters:  08/27/13 5\' 10"  (1.778 m)    Weight: Wt Readings from Last 1 Encounters:  08/27/13 177 lb 0.5 oz (80.3 kg)    Ideal Body Weight: 166#  % Ideal Body Weight: 107%  Wt Readings from Last 10 Encounters:  08/27/13 177 lb 0.5 oz (80.3 kg)  08/18/13 180 lb (81.647 kg)  08/17/13 180 lb (81.647 kg)  08/08/13 180 lb (81.647 kg)  08/07/13 180 lb (81.647 kg)  06/24/13 183 lb (83.008 kg)  06/16/13 183 lb (83.008 kg)  06/13/13 183 lb (83.008 kg)  05/28/13 180 lb (81.647 kg)  05/22/13 180 lb 0.2 oz (81.654 kg)    Usual Body Weight: 180#  % Usual Body Weight: 98%  BMI:  Body mass index is 25.4 kg/(m^2). Meets criteria for overweight.   Estimated Nutritional Needs: Kcal: 1500-1600 daily Protein: 64-80 grams daily Fluid: 1.5-1.6 L daily  Skin: skin tear on index finger  Diet Order: Dysphagia  EDUCATION NEEDS: -Education not appropriate at this time   Intake/Output Summary (Last 24  hours) at 08/31/13 1515 Last data filed at 08/31/13 1047  Gross per 24 hour  Intake    240 ml  Output    800 ml  Net   -560 ml    Last BM: 08/31/13   Labs:   Recent Labs Lab 08/27/13 0759 08/29/13 0521 08/30/13 0546 08/31/13 0457  NA 135* 134* 134* 134*  K 4.7 3.9 4.1 3.8  CL 98 102 100 99  CO2 23 19 20 22   BUN 24* 14 20 24*  CREATININE 1.06 0.91 0.95 0.91  CALCIUM 8.1* 7.8* 8.4 8.4  MG  --   --  2.0  --   PHOS 2.9  --   --   --   GLUCOSE 258* 202* 257* 186*    CBG (last 3)   Recent Labs  08/30/13 2222 08/31/13 0743 08/31/13 1146  GLUCAP 212* 214* 238*    Scheduled Meds: . albuterol  2.5 mg Nebulization QID  . aspirin EC  81 mg Oral Daily  . atorvastatin  40 mg Oral q1800  . carvedilol  3.125 mg Oral BID WC  . enoxaparin (LOVENOX) injection  40 mg Subcutaneous Q24H  . insulin aspart  0-9 Units Subcutaneous TID WC  . lisinopril  5 mg Oral Daily  . metroNIDAZOLE  500 mg Oral 3 times per day  . pantoprazole  40 mg Oral Daily  . ranolazine  500 mg Oral BID  . Ticagrelor  90 mg Oral BID  Continuous Infusions:   Past Medical History  Diagnosis Date  . Diabetes mellitus, type II   . Hyperlipidemia     Lipid profile in 02/2012:135, 227, 41, 49  . Coronary atherosclerosis of native coronary artery     a. CABG x 4 in 1989 (VG->OM1->OM2, VG->RCA, LIMA->LAD), b. 05/2010: DES to VG-OM1/OM2, DES to distal LCx. c. NSTEMI in 04/2011 - TO distal LCX stent and VG->OM2. d. 02/2012 NSTEMI DES to VG-OM1/continuation to OM2 occluded. e. inferior STEMI s/p DES to SVG-RAMUS 06/2012. f. inferolat STEMI 09/2012 s/p DES to SVG-interm; g. Lex MV (11/14):  EF 35%, inf-lat scar with small peri-infarct ischemia  . Essential hypertension, benign   . Osteoarthritis   . History of stroke   . History of pneumonia   . Cervical vertebral fracture   . Chronic back pain   . Benign prostatic hypertrophy     History of urinary retention  . Peptic ulcer disease   . Gastroesophageal  reflux disease   . Ischemic cardiomyopathy     LVEF 45-50%; Echo (04/2013):  mild LVH, EF 50%, Gr 1 DD, inf-lat HK, inf-septal and ant-lat HK, Tr MR, mild LAE    Past Surgical History  Procedure Laterality Date  . Tonsillectomy    . Coronary artery bypass graft     Jeffrey [name] A. Jeffrey [name], RD, LDN Pager: [phone]

## 2013-08-31 NOTE — Plan of Care (Signed)
Pt complaining of SOB  - called resp to get PRN NEBS treatment

## 2013-08-31 NOTE — Progress Notes (Signed)
Patient ID: Jeffrey [name], male   DOB: July 30, 1928, 80 y.o.   MRN: [id number]     Primary cardiologist:  Subjective:   No chest pain   Objective:   Temp:  [97.7 F (36.5 C)-98.2 F (36.8 C)] 97.7 F (36.5 C) (02/18 0446) Pulse Rate:  [83-88] 83 (02/18 0446) Resp:  [11-19] 19 (02/18 0446) BP: (97-128)/(61-70) 97/62 mmHg (02/18 0845) SpO2:  [94 %-98 %] 98 % (02/18 0712) Last BM Date: 08/31/13  Filed Weights   08/26/13 2205 08/27/13 0228  Weight: 180 lb (81.647 kg) 177 lb 0.5 oz (80.3 kg)    Intake/Output Summary (Last 24 hours) at 08/31/13 1038 Last data filed at 08/31/13 0447  Gross per 24 hour  Intake    480 ml  Output    550 ml  Net    -70 ml    Telemetry:sinus rhythm  Exam:  General:NAD  Resp: expiratory wheeze at bases  Cardiac: RRR, no m/r/g,no JVD  TD:VVOHYWV soft, NT, ND  PXT:GGYIRSWNIOE are warm, no edema  Neuro: no focal deficits   Lab Results:  Basic Metabolic Panel:  Recent Labs Lab 08/29/13 0521 08/30/13 0546 08/31/13 0457  NA 134* 134* 134*  K 3.9 4.1 3.8  CL 102 100 99  CO2 19 20 22   GLUCOSE 202* 257* 186*  BUN 14 20 24*  CREATININE 0.91 0.95 0.91  CALCIUM 7.8* 8.4 8.4  MG  --  2.0  --     Liver Function Tests:  Recent Labs Lab 08/26/13 2255 08/27/13 0616 08/27/13 0759  AST 23 20  --   ALT 24 22  --   ALKPHOS 78 75  --   BILITOT 0.3 0.5  --   PROT 7.3 6.6  --   ALBUMIN 4.1 3.6 3.5    CBC:  Recent Labs Lab 08/29/13 0521 08/30/13 0546 08/31/13 0457  WBC 10.0 9.8 9.5  HGB 12.5* 12.9* 11.7*  HCT 36.6* 37.7* 34.6*  MCV 98.1 97.2 97.2  PLT 125* 147* 153    Cardiac Enzymes:  Recent Labs Lab 08/27/13 2155 08/28/13 0818 08/28/13 1427  CKTOTAL  --  294*  --   CKMB  --  15.9*  --   TROPONINI 1.25* 2.06* 1.79*    BNP:  Recent Labs  04/27/13 1140 08/17/13 2032  PROBNP 1023.00* 1587.0*    Coagulation: No results found for this basename: INR,  in the last 168 hours  ECG:   Medications:     Scheduled Medications: . albuterol  2.5 mg Nebulization QID  . aspirin EC  81 mg Oral Daily  . atorvastatin  40 mg Oral q1800  . carvedilol  3.125 mg Oral BID WC  . enoxaparin (LOVENOX) injection  40 mg Subcutaneous Q24H  . insulin aspart  0-9 Units Subcutaneous TID WC  . lisinopril  5 mg Oral Daily  . metroNIDAZOLE  500 mg Oral 3 times per day  . pantoprazole  40 mg Oral Daily  . ranolazine  500 mg Oral BID  . Ticagrelor  90 mg Oral BID     Infusions:     PRN Medications:  acetaminophen, albuterol, ondansetron (ZOFRAN) IV, ondansetron  10/2012 [facility] Procedural Findings:  Hemodynamics:  AO 117/58 with a mean of 83 mmHg  LV 117/9 mmHg  Coronary angiography:  Coronary dominance: right  Left mainstem: The left main coronary is heavily calcified. It has mild tapering distally up to 30%.  Left anterior descending (LAD): The left anterior descending artery is heavily calcified in the  proximal vessel. There is a 90% proximal stenosis followed by complete occlusion following a large septal perforator Olman Yono.  Left circumflex (LCx): The first and second obtuse marginal vessels are occluded. The left circumflex is diffusely diseased throughout the mid vessel up to 70%. The distal vessel is occluded.  Right coronary artery (RCA): The right coronary is small and diffusely diseased up to 90% throughout the proximal vessel. It is occluded in the mid vessel. There are bridging right to right collaterals as well as left to right collaterals.  The saphenous vein graft to the obtuse marginal vessels is occluded.  The saphenous vein graft to the ramus intermediate Nohemi Nicklaus is widely patent. The stented segment in the proximal vessel is without significant disease.  The LIMA graft to the LAD is widely patent with good runoff. There is a 50% stenosis in the LAD following the graft touchdown.  Left ventriculography: Left ventricular systolic function is normal, LVEF is estimated at 55-60%, there is mild  inferior basal hypokinesis,there is no significant mitral regurgitation  Final Conclusions:  1. Severe three-vessel obstructive coronary disease.  2. The saphenous vein graft to the ramus intermediate Aleesha Ringstad is patent.  3. Patent LIMA graft to the LAD.  4. Occluded saphenous vein graft to the obtuse marginal vessels.  5. Good left ventricular function.    Assessment/Plan   80 yo male hx of CAD, chronic chest pain, HTN admitted with c.diff colitis and troponin elevation.  1. NSTEMI - in the setting of c.diff infection, dehydration,and hypotension. Likely demand ischemia in the setting of chronic obstructive CAD - troponins peaked at 2, trending down.  - last cath 10/2012 LM 30%, LAD occluded, LCX diffuse 70% disease with occlued OM1 and OM2, RCA small and diffusely diseased 90% and occlued in mid vessel. SVG to OM occluded, SVG to ramus patent with patent stent, LIMA to LAD patent with 50% LAD distal to graft touchdown, LVEF 55-60%, with recs for medical management.  - stress test 05/2013 with small peri-infarct ischemia in the inferolateral wall - continue medical therapy and management of infection  No further cardiac testing of interventions planned at this time. Will signoff of inpatient care, please call with any questions or concerns.     Carlyle Dolly, M.D., F.A.C.C.

## 2013-09-01 LAB — GLUCOSE, CAPILLARY
Glucose-Capillary: 229 mg/dL — ABNORMAL HIGH (ref 70–99)
Glucose-Capillary: 233 mg/dL — ABNORMAL HIGH (ref 70–99)

## 2013-09-01 LAB — BASIC METABOLIC PANEL
BUN: 27 mg/dL — ABNORMAL HIGH (ref 6–23)
CO2: 21 mEq/L (ref 19–32)
CREATININE: 0.85 mg/dL (ref 0.50–1.35)
Calcium: 8.4 mg/dL (ref 8.4–10.5)
Chloride: 101 mEq/L (ref 96–112)
GFR, EST NON AFRICAN AMERICAN: 79 mL/min — AB (ref >90)
Glucose, Bld: 254 mg/dL — ABNORMAL HIGH (ref 70–99)
Potassium: 4.2 mEq/L (ref 3.7–5.3)
Sodium: 134 mEq/L — ABNORMAL LOW (ref 137–147)

## 2013-09-01 MED ORDER — FAMOTIDINE 20 MG PO TABS: 20.0000 mg | ORAL_TABLET | Freq: Two times a day (BID) | ORAL | Status: DC

## 2013-09-01 MED ORDER — FAMOTIDINE 20 MG PO TABS
20.0000 mg | ORAL_TABLET | Freq: Two times a day (BID) | ORAL | Status: DC
Start: 2013-09-01 — End: 2013-09-01

## 2013-09-01 MED ORDER — LISINOPRIL 5 MG PO TABS: 5.0000 mg | ORAL_TABLET | Freq: Every day | ORAL | Status: DC

## 2013-09-01 MED ORDER — METRONIDAZOLE 500 MG PO TABS: 500.0000 mg | ORAL_TABLET | Freq: Three times a day (TID) | ORAL | Status: DC

## 2013-09-01 NOTE — Progress Notes (Signed)
PROGRESS NOTE  Jeffrey [name]:[id number] DOB: [birth year] DOA: 08/26/2013 PCP: [name]., MD Primary Cardiologist: [name]  Summary: 80 year old man presented with nausea, vomiting, diarrhea and chest pain. Admitted for further evaluation of chest pain, diagnosed with acute C. difficile colitis. Sustained non-ST elevation MI, thought secondary to demand ischemia from volume contraction and subsequent hypotension.  Assessment/Plan: 1. C. difficile colitis. Appears clinically resolved. Continue Flagyl. 2. NSTEMI type 2. Thought to be secondary to demand ischemia in the setting of fluid loss with known multivessel coronary artery disease. Cardiology has recommended medical therapy only. 3. Coronary artery disease. Continue Brilinta, ASA, Lipitor, Coreg, Ranexa, lisinopril. 4. DM type 2. Capillary blood sugars stable. Resume metformin on discharge.   Continue Flagyl to complete a two-week course.  Followup with cardiology as an outpatient.  Continue dysphagia 3 diet. Thin liquids.  Transfer to skilled nursing facility today.  [name], MD  [facility]  Pager [phone] If 7PM-7AM, please contact night-coverage at [email or web address], password [facility] 09/01/2013, 11:05 AM  LOS: 6 days   Consultants:  Cardiology  Physical therapy: SNF  Procedures:    Antibiotics:  Metronidazole 2/14 >> 2/27  HPI/Subjective: He feels weak. Diarrhea has resolved. He is looking forward to rehabilitation. Feels short of breath at times.  Objective: Filed Vitals:   09/01/13 0526 09/01/13 0600 09/01/13 0657 09/01/13 1001  BP:  119/74  135/87  Pulse:  78  81  Temp:  97.4 F (36.3 C)  97.8 F (36.6 C)  TempSrc:  Oral    Resp:  20  20  Height:      Weight:      SpO2: 93% 92% 93% 95%    Intake/Output Summary (Last 24 hours) at 09/01/13 1105 Last data filed at 09/01/13 0500  Gross per 24 hour  Intake    315 ml  Output    900 ml  Net   -585 ml     Filed  Weights   08/26/13 2205 08/27/13 0228  Weight: 81.647 kg (180 lb) 80.3 kg (177 lb 0.5 oz)    Exam:   Afebrile, vital signs stable. No hypoxia, per staff 95% on room air.  Gen. Appears calm and comfortable. Nontoxic.  Cardiovascular. Regular rate and rhythm. No murmur, rub or gallop.  Respiratory clear to auscultation bilaterally. No wheezes, rales or rhonchi. Fair air movement. Normal respiratory effort. During the examination he does at times have some forced expiratory wheezing which is heard in the upper airways but this does not persist during sleep which was observed and was inconsistent during examination.  Psychiatric. Somewhat anxious.  Data Reviewed:  Capillary blood sugars stable.  Excellent urine output  Scheduled Meds: . albuterol  2.5 mg Nebulization QID  . aspirin EC  81 mg Oral Daily  . atorvastatin  40 mg Oral q1800  . carvedilol  3.125 mg Oral BID WC  . enoxaparin (LOVENOX) injection  40 mg Subcutaneous Q24H  . feeding supplement (GLUCERNA SHAKE)  237 mL Oral BID BM  . insulin aspart  0-9 Units Subcutaneous TID WC  . lisinopril  5 mg Oral Daily  . metroNIDAZOLE  500 mg Oral 3 times per day  . pantoprazole  40 mg Oral Daily  . ranolazine  500 mg Oral BID  . Ticagrelor  90 mg Oral BID   Continuous Infusions:    Principal Problem:   C. difficile diarrhea Active Problems:   Hyperlipidemia   Diabetes mellitus, type II   Hypertension   Coronary atherosclerosis of  native coronary artery   Chest pain   Nausea & vomiting   Diarrhea   NSTEMI (non-ST elevated myocardial infarction)

## 2013-09-01 NOTE — Clinical Social Work Placement (Signed)
Clinical Social Work Department CLINICAL SOCIAL WORK PLACEMENT NOTE 09/01/2013  Patient:  Jeffrey [name], Jeffrey [name]  Account Number:  [id number] Admit date:  08/26/2013  Clinical Social Worker:  Benay Pike, LCSW  Date/time:  08/29/2013 01:29 PM  Clinical Social Work is seeking post-discharge placement for this patient at the following level of care:   SKILLED NURSING   (*CSW will update this form in Epic as items are completed)   08/29/2013  Patient/family provided with [place] Department of Clinical Social Work's list of facilities offering this level of care within the geographic area requested by the patient (or if unable, by the patient's family).  08/29/2013  Patient/family informed of their freedom to choose among providers that offer the needed level of care, that participate in Medicare, Medicaid or managed care program needed by the patient, have an available bed and are willing to accept the patient.  08/29/2013  Patient/family informed of [facility]' ownership interest in [facility], as well as of the fact that they are under no obligation to receive care at this facility.  PASARR submitted to EDS on 08/29/2013 PASARR number received from EDS on 08/29/2013  FL2 transmitted to all facilities in geographic area requested by pt/family on  08/29/2013 FL2 transmitted to all facilities within larger geographic area on   Patient informed that his/her managed care company has contracts with or will negotiate with  certain facilities, including the following:     Patient/family informed of bed offers received:  08/30/2013 Patient chooses bed at [facility] Physician recommends and patient chooses bed at  [facility]  Patient to be transferred to [facility] on  09/01/2013 Patient to be transferred to facility by [facility]  The following physician request were entered in Epic:   Additional Comments:  Benay Pike, [place]

## 2013-09-01 NOTE — Progress Notes (Signed)
Inpatient Diabetes Program Recommendations  AACE/ADA: New Consensus Statement on Inpatient Glycemic Control (2013)  Target Ranges:  Prepandial:   less than 140 mg/dL      Peak postprandial:   less than 180 mg/dL (1-2 hours)      Critically ill patients:  140 - 180 mg/dL   Results for IZZAC, ROCKETT (MRN [id number]) as of 09/01/2013 11:08  Ref. Range 08/31/2013 07:43 08/31/2013 11:46 08/31/2013 16:53 08/31/2013 22:27 09/01/2013 07:31  Glucose-Capillary Latest Range: 70-99 mg/dL 214 (H) 238 (H) 225 (H) 229 (H) 229 (H)   Diabetes history: DM2  Outpatient Diabetes medications: Metformin 500mg  BID  Current orders for Inpatient glycemic control: Novolog 0-9 units AC  Inpatient Diabetes Program Recommendations Correction (SSI): Please consider increasing Novolog correction to moderate scale and add Novolog bedtime correction scale.  Note: Blood glucose ranged from 214-238 on 2/18 and fasting blood glucose is 229 mg/dl.  Please increase Novolog correction scale and add bedtime correction.  Thanks, Barnie Alderman, RN, MSN, CCRN Diabetes Coordinator Inpatient Diabetes Program [phone] (Team Pager) [phone] ([facility] office) [phone] [facility] office)

## 2013-09-01 NOTE — Clinical Social Work Note (Signed)
Pt d/c today to [facility]. Pt and facility aware and agreeable. D/C summary faxed. Pt states he feels he needs EMS. RN also feels is appropriate. Pt to transfer via [facility].   Benay Pike, [place]

## 2013-09-01 NOTE — Progress Notes (Signed)
Patient being discharged to [facility] skilled nursing facility,report called,and given to [facility] LPN. Vital signs stable. Transported by EMS to facility.

## 2013-09-01 NOTE — Discharge Summary (Addendum)
Physician Discharge Summary  NICKALAS MCCARRICK FAO:[id number] DOB: [birth year] DOA: 08/26/2013  PCP: [name]., MD  Admit date: 08/26/2013 Discharge date: 09/01/2013  Recommendations for Outpatient Follow-up:  1. Resolution of C. difficile colitis. Patient has been on Protonix for GERD. Peptic ulcer disease as listed in the chart but he denies any history of bleeding. He has had a GI evaluation in the past there is no mention of GI bleed. Therefore this point stop PPI. He was getting some benefit from this for GERD, therefore will suggest H2 blocker. 2. Non-ST elevation MI type 2   Follow-up Information   Follow up with [name]., MD In 2 weeks.   Specialty:  Internal Medicine   Contact information:   [address] [place] [address] [phone]       Follow up with [name] A, MD In 2 weeks.   Specialty:  Cardiology   Contact information:   [address] [place] [address] [phone]      Discharge Diagnoses:  1. C. difficile colitis. 2. Non-ST elevation MI type 2 3. Coronary artery disease 4. Diabetes mellitus type 2  Discharge Condition: Improved Disposition: SNF  Diet recommendation: Dysphagia 3 diet, thin liquids  Filed Weights   08/26/13 2205 08/27/13 0228  Weight: 81.647 kg (180 lb) 80.3 kg (177 lb 0.5 oz)    History of present illness:  80 year old man presented with nausea, vomiting, diarrhea and chest pain. Admitted for further evaluation of chest pain, diagnosed with acute C. difficile colitis.   Hospital Course:  Early in hospitalization patient sustained non-ST elevation MI, thought secondary to demand ischemia from volume contraction and subsequent hypotension. She was seen by cardiology in consultation, conservative management was recommended and lisinopril was added. No further inpatient testing was recommended. C. difficile colitis report improved with empiric Flagyl. Patient was assessed by  physical therapy and rehabilitation recommended.  1. C. difficile colitis. Appears clinically resolved. Continue Flagyl. 2. NSTEMI type 2. Thought to be secondary to demand ischemia in the setting of fluid loss with known multivessel coronary artery disease. Cardiology has recommended medical therapy only. 3. Coronary artery disease. Continue Brilinta, ASA, Lipitor, Coreg, Ranexa, lisinopril. 4. DM type 2. Capillary blood sugars stable. Resume metformin on discharge. Continue Flagyl to complete a two-week course.  Followup with cardiology as an outpatient.  Continue dysphagia 3 diet. Thin liquids.  Transfer to skilled nursing facility today.  Consultants:  Cardiology  Physical therapy: SNF Procedures:  Antibiotics:  Metronidazole 2/14 >> 2/27  Discharge Instructions     Medication List    STOP taking these medications       loperamide 2 MG capsule  Commonly known as:  IMODIUM     pantoprazole 40 MG tablet  Commonly known as:  PROTONIX      TAKE these medications       albuterol 108 (90 BASE) MCG/ACT inhaler  Commonly known as:  PROVENTIL HFA;VENTOLIN HFA  Inhale 2 puffs into the lungs every 6 (six) hours as needed for wheezing or shortness of breath.     aspirin 81 MG tablet  Take 1 tablet (81 mg total) by mouth daily.     atorvastatin 40 MG tablet  Commonly known as:  LIPITOR  Take 1 tablet (40 mg total) by mouth daily at 6 PM.     carvedilol 3.125 MG tablet  Commonly known as:  COREG  Take 1 tablet (3.125 mg total) by mouth 2 (two) times daily with a  meal.     famotidine 20 MG tablet  Commonly known as:  PEPCID  Take 1 tablet (20 mg total) by mouth 2 (two) times daily.     ferrous sulfate 325 (65 FE) MG tablet  Take 325 mg by mouth daily with breakfast.     furosemide 20 MG tablet  Commonly known as:  LASIX  Take 20 mg by mouth daily as needed for fluid. Take 1 tablet as needed if 3 lb weight gain.     lisinopril 5 MG tablet  Commonly known as:   PRINIVIL,ZESTRIL  Take 1 tablet (5 mg total) by mouth daily.     metFORMIN 500 MG tablet  Commonly known as:  GLUCOPHAGE  Take 500 mg by mouth 2 (two) times daily with a meal.     metroNIDAZOLE 500 MG tablet  Commonly known as:  FLAGYL  Take 1 tablet (500 mg total) by mouth every 8 (eight) hours. Last dose 2/27 in the evening.     multivitamin with minerals Tabs tablet  Take 1 tablet by mouth daily.     nitroGLYCERIN 0.4 MG SL tablet  Commonly known as:  NITROSTAT  Place 1 tablet (0.4 mg total) under the tongue every 5 (five) minutes x 3 doses as needed for chest pain.     potassium chloride SA 20 MEQ tablet  Commonly known as:  K-DUR,KLOR-CON  Take 20 mEq by mouth daily as needed (Takes only with Lasix).     ranolazine 500 MG 12 hr tablet  Commonly known as:  RANEXA  Take 1 tablet (500 mg total) by mouth 2 (two) times daily.     tamsulosin 0.4 MG Caps capsule  Commonly known as:  FLOMAX  Take 0.4 mg by mouth daily after supper.     Ticagrelor 90 MG Tabs tablet  Commonly known as:  BRILINTA  Take 1 tablet (90 mg total) by mouth 2 (two) times daily.       No Known Allergies  The results of significant diagnostics from this hospitalization (including imaging, microbiology, ancillary and laboratory) are listed below for reference.    Significant Diagnostic Studies: Dg Chest 2 View  08/27/2013   CLINICAL DATA:  Chest pain, short of breath, weakness and vomiting  EXAM: CHEST  2 VIEW  COMPARISON:  Prior chest x-ray 03/06/2014  FINDINGS: Stable cardiomegaly. Patient is status post median sternotomy with evidence of prior multivessel CABG including LIMA bypass. The upper-most sternal wire is fractured but unchanged in position compared to prior. Previously identified venous hypertension has improved and is no longer present. No focal airspace consolidation. Mild central bronchitic changes and pulmonary hyper expansion are stable. No acute osseous abnormality.  IMPRESSION: No active  cardiopulmonary disease. Interval improvement in pulmonary venous hypertension compared to 08/17/2013.   Electronically Signed   By: Jacqulynn Cadet M.D.   On: 08/27/2013 00:17   Microbiology: Recent Results (from the past 240 hour(s))  CLOSTRIDIUM DIFFICILE BY PCR     Status: Abnormal   Collection Time    08/27/13  9:11 AM      Result Value Ref Range Status   C difficile by pcr POSITIVE (*) NEGATIVE Final   Comment: CRITICAL RESULT CALLED TO, READ BACK BY AND VERIFIED WITH:     BULLINS M. AT 1010A ON [id number] BY THOMPSON S.     Labs: Basic Metabolic Panel:  Recent Labs Lab 08/27/13 0759 08/29/13 0521 08/30/13 0546 08/31/13 0457 09/01/13 0526  NA 135* 134* 134* 134* 134*  K  4.7 3.9 4.1 3.8 4.2  CL 98 102 100 99 101  CO2 23 19 20 22 21   GLUCOSE 258* 202* 257* 186* 254*  BUN 24* 14 20 24* 27*  CREATININE 1.06 0.91 0.95 0.91 0.85  CALCIUM 8.1* 7.8* 8.4 8.4 8.4  MG  --   --  2.0  --   --   PHOS 2.9  --   --   --   --    Liver Function Tests:  Recent Labs Lab 08/26/13 2255 08/27/13 0616 08/27/13 0759  AST 23 20  --   ALT 24 22  --   ALKPHOS 78 75  --   BILITOT 0.3 0.5  --   PROT 7.3 6.6  --   ALBUMIN 4.1 3.6 3.5    Recent Labs Lab 08/26/13 2255  LIPASE 36   CBC:  Recent Labs Lab 08/26/13 2255 08/27/13 0238 08/27/13 0616 08/29/13 0521 08/30/13 0546 08/31/13 0457  WBC 11.2* 10.3 10.0 10.0 9.8 9.5  NEUTROABS 10.1*  --   --   --   --   --   HGB 14.4 14.1 14.5 12.5* 12.9* 11.7*  HCT 41.6 41.2 43.0 36.6* 37.7* 34.6*  MCV 97.0 97.4 98.4 98.1 97.2 97.2  PLT 151 142* 149* 125* 147* 153   Cardiac Enzymes:  Recent Labs Lab 08/27/13 1404 08/27/13 1514 08/27/13 2155 08/28/13 0818 08/28/13 1427  CKTOTAL  --   --   --  294*  --   CKMB  --   --   --  15.9*  --   TROPONINI 0.64* 0.74* 1.25* 2.06* 1.79*     Recent Labs  04/27/13 1140 08/17/13 2032  PROBNP 1023.00* 1587.0*   CBG:  Recent Labs Lab 08/31/13 0743 08/31/13 1146 08/31/13 1653  08/31/13 2227 09/01/13 0731  GLUCAP 214* 238* 225* 229* 229*    Principal Problem:   C. difficile diarrhea Active Problems:   Hyperlipidemia   Diabetes mellitus, type II   Hypertension   Coronary atherosclerosis of native coronary artery   Chest pain   Nausea & vomiting   Diarrhea   NSTEMI (non-ST elevated myocardial infarction)   Time coordinating discharge: 35 minutes  Signed:  [name], MD [facility] 09/01/2013, 11:19 AM

## 2013-09-22 ENCOUNTER — Encounter: Payer: Self-pay | Admitting: Adult Health

## 2013-09-22 NOTE — Progress Notes (Signed)
HPI: [name] is an 80 year old male patient of Dr. Pennelope Bracken we are following post hospitalization after admission for nausea vomiting diarrhea and non-ST elevation MI. He was diagnosed with C. difficile gastritis. Has a known history of CAD, hypertension, and chronic lung disease.  He comes today after discharge and has now been admitted to [facility].  They are weighing him daily. He has now been placed on lasix 20 mg daily, after using 10 mg prn.  He is complaining of dizziness and weakness after taking medications in the am.; Feels weak for several hours, then better in the afternoon.  He denies chest pain or DOE. He is still deconditioned., Wheel chair for ambulation at this time.  No Known Allergies  Current Outpatient Prescriptions  Medication Sig Dispense Refill  . albuterol (PROVENTIL HFA;VENTOLIN HFA) 108 (90 BASE) MCG/ACT inhaler Inhale 2 puffs into the lungs every 6 (six) hours as needed for wheezing or shortness of breath.      Marland Kitchen aspirin 81 MG tablet Take 1 tablet (81 mg total) by mouth daily.      Marland Kitchen atorvastatin (LIPITOR) 40 MG tablet Take 1 tablet (40 mg total) by mouth daily at 6 PM.  30 tablet  11  . carvedilol (COREG) 3.125 MG tablet Take 1 tablet (3.125 mg total) by mouth 2 (two) times daily with a meal.  60 tablet  3  . famotidine (PEPCID) 20 MG tablet Take 1 tablet (20 mg total) by mouth 2 (two) times daily.      . ferrous sulfate 325 (65 FE) MG tablet Take 325 mg by mouth daily with breakfast.      . furosemide (LASIX) 20 MG tablet Take 20 mg by mouth daily as needed for fluid. Take 1 tablet as needed if 3 lb weight gain.      Marland Kitchen lisinopril (PRINIVIL,ZESTRIL) 2.5 MG tablet Take 1 tablet (2.5 mg total) by mouth daily.  30 tablet  6  . metFORMIN (GLUCOPHAGE) 500 MG tablet Take 500 mg by mouth 2 (two) times daily with a meal.      . Multiple Vitamin (MULTIVITAMIN WITH MINERALS) TABS Take 1 tablet by mouth daily.      . nitroGLYCERIN (NITROSTAT) 0.4 MG SL  tablet Place 1 tablet (0.4 mg total) under the tongue every 5 (five) minutes x 3 doses as needed for chest pain.  25 tablet  3  . potassium chloride SA (K-DUR,KLOR-CON) 20 MEQ tablet Take 20 mEq by mouth daily as needed (Takes only with Lasix).      . ranolazine (RANEXA) 500 MG 12 hr tablet Take 1 tablet (500 mg total) by mouth 2 (two) times daily.  60 tablet  6  . tamsulosin (FLOMAX) 0.4 MG CAPS Take 0.4 mg by mouth daily after supper.       . Ticagrelor (BRILINTA) 90 MG TABS tablet Take 1 tablet (90 mg total) by mouth 2 (two) times daily.  60 tablet  6   No current facility-administered medications for this visit.    Past Medical History  Diagnosis Date  . Diabetes mellitus, type II   . Hyperlipidemia     Lipid profile in 02/2012:135, 227, 41, 49  . Coronary atherosclerosis of native coronary artery     a. CABG x 4 in 1989 (VG->OM1->OM2, VG->RCA, LIMA->LAD), b. 05/2010: DES to VG-OM1/OM2, DES to distal LCx. c. NSTEMI in 04/2011 - TO distal LCX stent and VG->OM2. d. 02/2012 NSTEMI DES to VG-OM1/continuation to OM2 occluded. e. inferior STEMI  s/p DES to SVG-RAMUS 06/2012. f. inferolat STEMI 09/2012 s/p DES to SVG-interm; g. Lex MV (11/14):  EF 35%, inf-lat scar with small peri-infarct ischemia  . Essential hypertension, benign   . Osteoarthritis   . History of stroke   . History of pneumonia   . Cervical vertebral fracture   . Chronic back pain   . Benign prostatic hypertrophy     History of urinary retention  . Peptic ulcer disease   . Gastroesophageal reflux disease   . Ischemic cardiomyopathy     LVEF 45-50%; Echo (04/2013):  mild LVH, EF 50%, Gr 1 DD, inf-lat HK, inf-septal and ant-lat HK, Tr MR, mild LAE    Past Surgical History  Procedure Laterality Date  . Tonsillectomy    . Coronary artery bypass graft      ROS: Review of systems complete and found to be negative unless listed above  PHYSICAL EXAM BP 104/68  Pulse 86  Ht 5\' 7"  (1.702 m)  Wt 165 lb (74.844 kg)  BMI  25.84 kg/m2  General: Well developed, well nourished, in no acute distress Head: Eyes PERRLA, No xanthomas.   Normal cephalic and atramatic  Lungs: Clear bilaterally to auscultation and percussion. Heart: HRRR S1 S2, without MRG.  Pulses are 2+ & equal.            No carotid bruit. No JVD.  No abdominal bruits. No femoral bruits. Abdomen: Bowel sounds are positive, abdomen soft and non-tender without masses or                  Hernia's noted. Msk:  Back normal, normal gait. Normal strength and tone for age. Extremities: No clubbing, cyanosis or edema.  Generalized fatigue, using wheelchair for ambulation.DP +1 Neuro: Alert and oriented X 3. Hard of hearing.  Psych:  Good affect, responds appropriately    ASSESSMENT AND PLAN

## 2013-09-23 ENCOUNTER — Encounter: Payer: Self-pay | Admitting: Adult Health

## 2013-09-23 ENCOUNTER — Ambulatory Visit (INDEPENDENT_AMBULATORY_CARE_PROVIDER_SITE_OTHER): Payer: Medicare Other | Admitting: Adult Health

## 2013-09-23 VITALS — BP 104/68 | HR 86 | Ht 67.0 in | Wt 165.0 lb

## 2013-09-23 DIAGNOSIS — I7090 Unspecified atherosclerosis: Secondary | ICD-10-CM

## 2013-09-23 DIAGNOSIS — I2510 Atherosclerotic heart disease of native coronary artery without angina pectoris: Secondary | ICD-10-CM

## 2013-09-23 DIAGNOSIS — I10 Essential (primary) hypertension: Secondary | ICD-10-CM

## 2013-09-23 MED ORDER — LISINOPRIL 2.5 MG PO TABS: 2.5000 mg | ORAL_TABLET | Freq: Every day | ORAL | Status: DC

## 2013-09-23 NOTE — Assessment & Plan Note (Signed)
Continues to be pain free. He is currently at [facility] for rehab. He is increasing his activity slowly. No pain with PT

## 2013-09-23 NOTE — Assessment & Plan Note (Signed)
I am concerned that he might be on too much antihypertensive medication. He is on coreg 3.15 mg BID and lisinopril 5 mg daily. Will significant deconditioning and sedentary lifestyle, he may be dropping BP too much after taking the medications. I will decrease the lisinopril to 2.5 mg daily. He is to have orthostatic BP completed daily. Parameters to call us for drop in BP 20 mmHg or more. Hold lisinopril for BP ,/= 100 mmHg.   Labs have recently been drawn, and will request copies be sent to [place].

## 2013-09-23 NOTE — Patient Instructions (Signed)
Your physician recommends that you schedule a follow-up appointment in: 3 months with Dr Bronson Ing  Your physician has recommended you make the following change in your medication:  Decrease Lisinopril 2.5 mg daily

## 2013-09-23 NOTE — Progress Notes (Deleted)
Name: Jeffrey [name]    DOB: [birth year]  Age: 80 y.o.  MR#: [id number]       PCP:  [name]., MD      Insurance: Payor: MEDICARE / Plan: MEDICARE PART A AND B / Product Type: *No Product type* /   CC:    Chief Complaint  Patient presents with  . Coronary Artery Disease    VS Filed Vitals:   09/23/13 1347  BP: 104/68  Pulse: 86  Height: 5\' 7"  (1.702 m)  Weight: 165 lb (74.844 kg)    Weights Current Weight  09/23/13 165 lb (74.844 kg)  08/27/13 177 lb 0.5 oz (80.3 kg)  08/18/13 180 lb (81.647 kg)    Blood Pressure  BP Readings from Last 3 Encounters:  09/23/13 104/68  09/01/13 135/87  08/18/13 111/59     Admit date:  (Not on file) Last encounter with RMR:  06/24/2013   Allergy Review of patient's allergies indicates no known allergies.  Current Outpatient Prescriptions  Medication Sig Dispense Refill  . albuterol (PROVENTIL HFA;VENTOLIN HFA) 108 (90 BASE) MCG/ACT inhaler Inhale 2 puffs into the lungs every 6 (six) hours as needed for wheezing or shortness of breath.      Marland Kitchen aspirin 81 MG tablet Take 1 tablet (81 mg total) by mouth daily.      Marland Kitchen atorvastatin (LIPITOR) 40 MG tablet Take 1 tablet (40 mg total) by mouth daily at 6 PM.  30 tablet  11  . carvedilol (COREG) 3.125 MG tablet Take 1 tablet (3.125 mg total) by mouth 2 (two) times daily with a meal.  60 tablet  3  . famotidine (PEPCID) 20 MG tablet Take 1 tablet (20 mg total) by mouth 2 (two) times daily.      . ferrous sulfate 325 (65 FE) MG tablet Take 325 mg by mouth daily with breakfast.      . furosemide (LASIX) 20 MG tablet Take 20 mg by mouth daily as needed for fluid. Take 1 tablet as needed if 3 lb weight gain.      Marland Kitchen lisinopril (PRINIVIL,ZESTRIL) 5 MG tablet Take 1 tablet (5 mg total) by mouth daily.      . metFORMIN (GLUCOPHAGE) 500 MG tablet Take 500 mg by mouth 2 (two) times daily with a meal.      . Multiple Vitamin (MULTIVITAMIN WITH MINERALS) TABS Take 1 tablet by mouth daily.      .  nitroGLYCERIN (NITROSTAT) 0.4 MG SL tablet Place 1 tablet (0.4 mg total) under the tongue every 5 (five) minutes x 3 doses as needed for chest pain.  25 tablet  3  . potassium chloride SA (K-DUR,KLOR-CON) 20 MEQ tablet Take 20 mEq by mouth daily as needed (Takes only with Lasix).      . ranolazine (RANEXA) 500 MG 12 hr tablet Take 1 tablet (500 mg total) by mouth 2 (two) times daily.  60 tablet  6  . tamsulosin (FLOMAX) 0.4 MG CAPS Take 0.4 mg by mouth daily after supper.       . Ticagrelor (BRILINTA) 90 MG TABS tablet Take 1 tablet (90 mg total) by mouth 2 (two) times daily.  60 tablet  6   No current facility-administered medications for this visit.    Discontinued Meds:    Medications Discontinued During This Encounter  Medication Reason  . metroNIDAZOLE (FLAGYL) 500 MG tablet Error    Patient Active Problem List   Diagnosis Date Noted  . NSTEMI (non-ST elevated myocardial infarction) 08/29/2013  .  Nausea & vomiting 08/27/2013  . Diarrhea 08/27/2013  . C. difficile diarrhea 08/27/2013  . Chest pain 06/16/2013  . Precordial chest pain 05/29/2013  . Coronary atherosclerosis of native coronary artery 05/29/2013  . Noncompliance 11/22/2012  . Anemia, normocytic normochromic   . Cardiomyopathy, ischemic 06/21/2012  . Cerebrovascular disease 06/08/2012  . Diabetes mellitus, type II   . Hypertension   . Arteriosclerotic cardiovascular disease (ASCVD) 10/25/2011  . Hyperlipidemia 10/24/2011  . BPH (benign prostatic hyperplasia) 05/07/2011    LABS    Component Value Date/Time   NA 134* 09/01/2013 0526   NA 134* 08/31/2013 0457   NA 134* 08/30/2013 0546   K 4.2 09/01/2013 0526   K 3.8 08/31/2013 0457   K 4.1 08/30/2013 0546   CL 101 09/01/2013 0526   CL 99 08/31/2013 0457   CL 100 08/30/2013 0546   CO2 21 09/01/2013 0526   CO2 22 08/31/2013 0457   CO2 20 08/30/2013 0546   GLUCOSE 254* 09/01/2013 0526   GLUCOSE 186* 08/31/2013 0457   GLUCOSE 257* 08/30/2013 0546   BUN 27* 09/01/2013  0526   BUN 24* 08/31/2013 0457   BUN 20 08/30/2013 0546   CREATININE 0.85 09/01/2013 0526   CREATININE 0.91 08/31/2013 0457   CREATININE 0.95 08/30/2013 0546   CREATININE 1.19 04/27/2013 1140   CREATININE 0.98 12/04/2011 1208   CALCIUM 8.4 09/01/2013 0526   CALCIUM 8.4 08/31/2013 0457   CALCIUM 8.4 08/30/2013 0546   GFRNONAA 79* 09/01/2013 0526   GFRNONAA 77* 08/31/2013 0457   GFRNONAA 75* 08/30/2013 0546   GFRAA >90 09/01/2013 0526   GFRAA 89* 08/31/2013 0457   GFRAA 87* 08/30/2013 0546   CMP     Component Value Date/Time   NA 134* 09/01/2013 0526   K 4.2 09/01/2013 0526   CL 101 09/01/2013 0526   CO2 21 09/01/2013 0526   GLUCOSE 254* 09/01/2013 0526   BUN 27* 09/01/2013 0526   CREATININE 0.85 09/01/2013 0526   CREATININE 1.19 04/27/2013 1140   CALCIUM 8.4 09/01/2013 0526   PROT 6.6 08/27/2013 0616   ALBUMIN 3.5 08/27/2013 0759   AST 20 08/27/2013 0616   ALT 22 08/27/2013 0616   ALKPHOS 75 08/27/2013 0616   BILITOT 0.5 08/27/2013 0616   GFRNONAA 79* 09/01/2013 0526   GFRAA >90 09/01/2013 0526       Component Value Date/Time   WBC 9.5 08/31/2013 0457   WBC 9.8 08/30/2013 0546   WBC 10.0 08/29/2013 0521   HGB 11.7* 08/31/2013 0457   HGB 12.9* 08/30/2013 0546   HGB 12.5* 08/29/2013 0521   HCT 34.6* 08/31/2013 0457   HCT 37.7* 08/30/2013 0546   HCT 36.6* 08/29/2013 0521   MCV 97.2 08/31/2013 0457   MCV 97.2 08/30/2013 0546   MCV 98.1 08/29/2013 0521    Lipid Panel     Component Value Date/Time   CHOL 190 05/29/2013 0410   TRIG 335* 05/29/2013 0410   HDL 33* 05/29/2013 0410   CHOLHDL 5.8 05/29/2013 0410   VLDL 67* 05/29/2013 0410   LDLCALC 90 05/29/2013 0410    ABG    Component Value Date/Time   TCO2 23 10/06/2012 1545     Lab Results  Component Value Date   TSH 3.483 10/16/2012   BNP (last 3 results)  Recent Labs  04/27/13 1140 08/17/13 2032  PROBNP 1023.00* 1587.0*   Cardiac Panel (last 3 results) No results found for this basename: CKTOTAL, CKMB, TROPONINI, RELINDX,  in the last  72 hours  Iron/TIBC/Ferritin No results found for this basename: iron, tibc, ferritin     EKG Orders placed during the hospital encounter of 08/26/13  . EKG 12-LEAD  . EKG 12-LEAD  . EKG 12-LEAD  . EKG 12-LEAD  . EKG     Prior Assessment and Plan Problem List as of 09/23/2013     Cardiovascular and Mediastinum   Arteriosclerotic cardiovascular disease (ASCVD)   Last Assessment & Plan   06/24/2013 Office Visit Written 06/24/2013  1:33 PM by Lendon Colonel, NP     Was readmitted again for recurrent chest pain. He was again ruled out for cardiac etiology of discomfort. Still seems to be related to GI. He had eaten spicey food the morning of and the evening before. He has an appt with GI in 2 weeks. He is advised to avoid spicy, fatty foods, until seeing GI.  He will continue his Ranexa. He lives alone and is easily anxious about his symptoms.     Hypertension   Last Assessment & Plan   06/24/2013 Office Visit Written 06/24/2013  1:34 PM by Lendon Colonel, NP     Excellent control of BP at this time. No changes.    Cerebrovascular disease   Last Assessment & Plan   09/09/2012 Office Visit Written 09/09/2012  3:42 PM by Yehuda Savannah, MD     Recent carotid ultrasound negative for obstruction. Optimal control of cardiovascular risk factors appears effective in preventing progression of disease and will be continued.    Cardiomyopathy, ischemic   Last Assessment & Plan   05/06/2013 Office Visit Written 05/06/2013  2:05 PM by Lendon Colonel, NP     Improved EF per echo this month to 50%. No further evidence of fluid retention. He is breathing better without edema and is down 5 lbs. I have asked him to weigh himself daily. If he gain 3 lbs in 24 hours. He is to take one dose of lasix with potasium that day. He is not to take it on a daily basis. He is reminded of low sodium diet. We will see him in 6 months.    Coronary atherosclerosis of native coronary artery   NSTEMI  (non-ST elevated myocardial infarction)     Digestive   Nausea & vomiting   C. difficile diarrhea     Endocrine   Diabetes mellitus, type II   Last Assessment & Plan   04/27/2013 Office Visit Written 04/27/2013  1:54 PM by Lendon Colonel, NP     Followed by PCP      Genitourinary   BPH (benign prostatic hyperplasia)   Last Assessment & Plan   09/09/2012 Office Visit Edited 09/11/2012 11:21 PM by Yehuda Savannah, MD     Patient advised not to increase his dose of Flomax or other medications without specific instructions and will resume his usual daily dose.  He is scheduled to return to see Dr. Michela Pitcher in the near future for catheter removal.      Other   Hyperlipidemia   Last Assessment & Plan   05/06/2013 Office Visit Written 05/06/2013  2:06 PM by Lendon Colonel, NP     Will need to have follow up lipids and LFT's in 3 months.    Anemia, normocytic normochromic   Last Assessment & Plan   04/27/2013 Office Visit Written 04/27/2013  1:55 PM by Lendon Colonel, NP     Stable. Most recent lab work last week. Continues on iron replacement.  Noncompliance   Last Assessment & Plan   04/27/2013 Office Visit Written 04/27/2013  1:54 PM by Lendon Colonel, NP     He is not adhering to low sodium diet. I have reinforced this to him. He is to weigh daily.    Precordial chest pain   Chest pain   Diarrhea       Imaging: Dg Chest 2 View  08/27/2013   CLINICAL DATA:  Chest pain, short of breath, weakness and vomiting  EXAM: CHEST  2 VIEW  COMPARISON:  Prior chest x-ray 03/06/2014  FINDINGS: Stable cardiomegaly. Patient is status post median sternotomy with evidence of prior multivessel CABG including LIMA bypass. The upper-most sternal wire is fractured but unchanged in position compared to prior. Previously identified venous hypertension has improved and is no longer present. No focal airspace consolidation. Mild central bronchitic changes and pulmonary hyper expansion  are stable. No acute osseous abnormality.  IMPRESSION: No active cardiopulmonary disease. Interval improvement in pulmonary venous hypertension compared to 08/17/2013.   Electronically Signed   By: Jacqulynn Cadet M.D.   On: 08/27/2013 00:17

## 2013-10-13 ENCOUNTER — Encounter (HOSPITAL_COMMUNITY): Payer: Self-pay | Admitting: Emergency Medicine

## 2013-10-13 ENCOUNTER — Encounter: Payer: Self-pay | Admitting: Adult Health

## 2013-10-13 ENCOUNTER — Emergency Department (HOSPITAL_COMMUNITY)
Admission: EM | Admit: 2013-10-13 | Discharge: 2013-10-13 | Disposition: A | Discharge status: Home / Self Care | Payer: Medicare Other | Attending: Emergency Medicine | Admitting: Emergency Medicine

## 2013-10-13 ENCOUNTER — Ambulatory Visit (INDEPENDENT_AMBULATORY_CARE_PROVIDER_SITE_OTHER): Payer: Medicare Other | Admitting: Adult Health

## 2013-10-13 VITALS — BP 120/58 | HR 90 | Ht 70.0 in | Wt 173.0 lb

## 2013-10-13 DIAGNOSIS — E785 Hyperlipidemia, unspecified: Secondary | ICD-10-CM | POA: Insufficient documentation

## 2013-10-13 DIAGNOSIS — Z951 Presence of aortocoronary bypass graft: Secondary | ICD-10-CM | POA: Insufficient documentation

## 2013-10-13 DIAGNOSIS — Z7982 Long term (current) use of aspirin: Secondary | ICD-10-CM | POA: Insufficient documentation

## 2013-10-13 DIAGNOSIS — Z79899 Other long term (current) drug therapy: Secondary | ICD-10-CM | POA: Insufficient documentation

## 2013-10-13 DIAGNOSIS — R197 Diarrhea, unspecified: Secondary | ICD-10-CM | POA: Insufficient documentation

## 2013-10-13 DIAGNOSIS — G8929 Other chronic pain: Secondary | ICD-10-CM | POA: Insufficient documentation

## 2013-10-13 DIAGNOSIS — I2510 Atherosclerotic heart disease of native coronary artery without angina pectoris: Secondary | ICD-10-CM

## 2013-10-13 DIAGNOSIS — M1990 Unspecified osteoarthritis, unspecified site: Secondary | ICD-10-CM | POA: Insufficient documentation

## 2013-10-13 DIAGNOSIS — A0472 Enterocolitis due to Clostridium difficile, not specified as recurrent: Secondary | ICD-10-CM

## 2013-10-13 DIAGNOSIS — Z8701 Personal history of pneumonia (recurrent): Secondary | ICD-10-CM | POA: Insufficient documentation

## 2013-10-13 DIAGNOSIS — Z8711 Personal history of peptic ulcer disease: Secondary | ICD-10-CM | POA: Insufficient documentation

## 2013-10-13 DIAGNOSIS — E119 Type 2 diabetes mellitus without complications: Secondary | ICD-10-CM | POA: Insufficient documentation

## 2013-10-13 DIAGNOSIS — I10 Essential (primary) hypertension: Secondary | ICD-10-CM | POA: Insufficient documentation

## 2013-10-13 DIAGNOSIS — K219 Gastro-esophageal reflux disease without esophagitis: Secondary | ICD-10-CM | POA: Insufficient documentation

## 2013-10-13 DIAGNOSIS — R109 Unspecified abdominal pain: Secondary | ICD-10-CM | POA: Insufficient documentation

## 2013-10-13 DIAGNOSIS — K921 Melena: Secondary | ICD-10-CM | POA: Insufficient documentation

## 2013-10-13 DIAGNOSIS — I7090 Unspecified atherosclerosis: Secondary | ICD-10-CM

## 2013-10-13 DIAGNOSIS — Z87448 Personal history of other diseases of urinary system: Secondary | ICD-10-CM | POA: Insufficient documentation

## 2013-10-13 DIAGNOSIS — Z8781 Personal history of (healed) traumatic fracture: Secondary | ICD-10-CM | POA: Insufficient documentation

## 2013-10-13 DIAGNOSIS — Z8673 Personal history of transient ischemic attack (TIA), and cerebral infarction without residual deficits: Secondary | ICD-10-CM | POA: Insufficient documentation

## 2013-10-13 DIAGNOSIS — Z87891 Personal history of nicotine dependence: Secondary | ICD-10-CM | POA: Insufficient documentation

## 2013-10-13 LAB — CBC WITH DIFFERENTIAL/PLATELET
BASOS ABS: 0 10*3/uL (ref 0.0–0.1)
BASOS PCT: 0 % (ref 0–1)
Eosinophils Absolute: 0.2 10*3/uL (ref 0.0–0.7)
Eosinophils Relative: 4 % (ref 0–5)
HCT: 35.7 % — ABNORMAL LOW (ref 39.0–52.0)
Hemoglobin: 12 g/dL — ABNORMAL LOW (ref 13.0–17.0)
Lymphocytes Relative: 23 % (ref 12–46)
Lymphs Abs: 1.3 10*3/uL (ref 0.7–4.0)
MCH: 31.8 pg (ref 26.0–34.0)
MCHC: 33.6 g/dL (ref 30.0–36.0)
MCV: 94.7 fL (ref 78.0–100.0)
Monocytes Absolute: 0.5 10*3/uL (ref 0.1–1.0)
Monocytes Relative: 9 % (ref 3–12)
NEUTROS ABS: 3.8 10*3/uL (ref 1.7–7.7)
NEUTROS PCT: 64 % (ref 43–77)
Platelets: 129 10*3/uL — ABNORMAL LOW (ref 150–400)
RBC: 3.77 MIL/uL — ABNORMAL LOW (ref 4.22–5.81)
RDW: 13.2 % (ref 11.5–15.5)
WBC: 5.9 10*3/uL (ref 4.0–10.5)

## 2013-10-13 LAB — BASIC METABOLIC PANEL
BUN: 15 mg/dL (ref 6–23)
CHLORIDE: 99 meq/L (ref 96–112)
CO2: 25 mEq/L (ref 19–32)
Calcium: 9.3 mg/dL (ref 8.4–10.5)
Creatinine, Ser: 0.91 mg/dL (ref 0.50–1.35)
GFR calc non Af Amer: 77 mL/min — ABNORMAL LOW (ref >90)
GFR, EST AFRICAN AMERICAN: 89 mL/min — AB (ref >90)
Glucose, Bld: 201 mg/dL — ABNORMAL HIGH (ref 70–99)
POTASSIUM: 4.3 meq/L (ref 3.7–5.3)
Sodium: 136 mEq/L — ABNORMAL LOW (ref 137–147)

## 2013-10-13 LAB — POC OCCULT BLOOD, ED: Fecal Occult Bld: NEGATIVE

## 2013-10-13 MED ORDER — SODIUM CHLORIDE 0.9 % IV BOLUS (SEPSIS)
500.0000 mL | Freq: Once | INTRAVENOUS | Status: AC
  Administered 2013-10-13: 500 mL via INTRAVENOUS

## 2013-10-13 MED ORDER — SODIUM CHLORIDE 0.9 % IV BOLUS (SEPSIS)
500.0000 mL | Freq: Once | INTRAVENOUS | Status: AC
  Administered 2013-10-13: 1000 mL via INTRAVENOUS

## 2013-10-13 MED ORDER — ONDANSETRON HCL 4 MG/2ML IJ SOLN
4.0000 mg | Freq: Once | INTRAMUSCULAR | Status: AC
  Administered 2013-10-13: 4 mg via INTRAVENOUS
  Filled 2013-10-13: qty 2

## 2013-10-13 MED ORDER — DIPHENOXYLATE-ATROPINE 2.5-0.025 MG PO TABS: 1.0000 | ORAL_TABLET | Freq: Three times a day (TID) | ORAL | Status: DC | PRN

## 2013-10-13 NOTE — Assessment & Plan Note (Signed)
He is now taking lisinopril on his own, as his blood pressures are running low. Today in the office it is normal. With frequent diarrhea and possible dehydration, I agree with him stopping this for now.

## 2013-10-13 NOTE — Assessment & Plan Note (Signed)
At this office visit, he has no cardiac complaints, his main complaint is recurrent diarrhea, frequent, occurring all day, with now associated listlessness. He has been sent over to the emergency room, at the [facility], for further evaluation. I am concerned that his C. difficile has returned, and he is becoming dehydrated again. I have spoken with Jenny Reichmann in the emergency room and have given him information on this patient concerning contact precautions in this setting. He is taken to the ER via wheelchair.

## 2013-10-13 NOTE — Discharge Instructions (Signed)
Medication for diarrhea. Clear liquids.   Return if worse.

## 2013-10-13 NOTE — Progress Notes (Signed)
HPI: Jeffrey [name] is an 80 year old male patient of Dr. Pennelope Bracken we are following post hospitalization after admission for nausea vomiting diarrhea and non-ST elevation MI. He was diagnosed with C. difficile gastritis. Has a known history of CAD, hypertension, and chronic lung disease.    He comes today after discharge and has now been admitted to [facility]. They are weighing him daily. He has now been placed on lasix 20 mg daily, after using 10 mg prn. He is complaining of dizziness and weakness after taking medications in the am.; Feels weak for several hours, then better in the afternoon. On last office visit I,decreased the lisinopril to 2.5 mg daily. He is to have orthostatic BP completed daily. Parameters to call us for drop in BP 20 mmHg or more. Hold lisinopril for BP ,/= 100 mmHg.    He comes today feeling weak again with frequent loose diarrhea similar to the C-Diff he experienced while hospitalized. He has stopped taking his lisinopril and atorvastatin, because his BP has been low.    The patient has had 3 stools and has had to leave the exam room each time to go to the bathroom. With his history of C-Diff he has not seen his PCP, Dr. Legrand Rams, "I couldn't get in."    He denies chest pain dyspnea on exertion, but is having overall fatigue, listlessness, and abdominal pain.        No Known Allergies  Current Outpatient Prescriptions  Medication Sig Dispense Refill  . albuterol (PROVENTIL HFA;VENTOLIN HFA) 108 (90 BASE) MCG/ACT inhaler Inhale 2 puffs into the lungs every 6 (six) hours as needed for wheezing or shortness of breath.      Marland Kitchen aspirin 81 MG tablet Take 1 tablet (81 mg total) by mouth daily.      . carvedilol (COREG) 3.125 MG tablet Take 1 tablet (3.125 mg total) by mouth 2 (two) times daily with a meal.  60 tablet  3  . famotidine (PEPCID) 20 MG tablet Take 1 tablet (20 mg total) by mouth 2 (two) times daily.      . ferrous sulfate 325 (65 FE) MG tablet Take 325  mg by mouth daily with breakfast.      . furosemide (LASIX) 20 MG tablet Take 20 mg by mouth daily as needed for fluid. Take 1 tablet as needed if 3 lb weight gain.      . metFORMIN (GLUCOPHAGE) 500 MG tablet Take 500 mg by mouth 2 (two) times daily with a meal.      . Multiple Vitamin (MULTIVITAMIN WITH MINERALS) TABS Take 1 tablet by mouth daily.      . nitroGLYCERIN (NITROSTAT) 0.4 MG SL tablet Place 1 tablet (0.4 mg total) under the tongue every 5 (five) minutes x 3 doses as needed for chest pain.  25 tablet  3  . potassium chloride SA (K-DUR,KLOR-CON) 20 MEQ tablet Take 20 mEq by mouth daily as needed (Takes only with Lasix).      . tamsulosin (FLOMAX) 0.4 MG CAPS Take 0.4 mg by mouth daily after supper.       . Ticagrelor (BRILINTA) 90 MG TABS tablet Take 1 tablet (90 mg total) by mouth 2 (two) times daily.  60 tablet  6  . atorvastatin (LIPITOR) 40 MG tablet Take 1 tablet (40 mg total) by mouth daily at 6 PM.  30 tablet  11  . lisinopril (PRINIVIL,ZESTRIL) 2.5 MG tablet Take 1 tablet (2.5 mg total) by mouth daily.  [place]  tablet  6   No current facility-administered medications for this visit.    Past Medical History  Diagnosis Date  . Diabetes mellitus, type II   . Hyperlipidemia     Lipid profile in 02/2012:135, 227, 41, 49  . Coronary atherosclerosis of native coronary artery     a. CABG x 4 in 1989 (VG->OM1->OM2, VG->RCA, LIMA->LAD), b. 05/2010: DES to VG-OM1/OM2, DES to distal LCx. c. NSTEMI in 04/2011 - TO distal LCX stent and VG->OM2. d. 02/2012 NSTEMI DES to VG-OM1/continuation to OM2 occluded. e. inferior STEMI s/p DES to SVG-RAMUS 06/2012. f. inferolat STEMI 09/2012 s/p DES to SVG-interm; g. Lex MV (11/14):  EF 35%, inf-lat scar with small peri-infarct ischemia  . Essential hypertension, benign   . Osteoarthritis   . History of stroke   . History of pneumonia   . Cervical vertebral fracture   . Chronic back pain   . Benign prostatic hypertrophy     History of urinary retention    . Peptic ulcer disease   . Gastroesophageal reflux disease   . Ischemic cardiomyopathy     LVEF 45-50%; Echo (04/2013):  mild LVH, EF 50%, Gr 1 DD, inf-lat HK, inf-septal and ant-lat HK, Tr MR, mild LAE    Past Surgical History  Procedure Laterality Date  . Tonsillectomy    . Coronary artery bypass graft      XLK:GMWNUU of systems complete and found to be negative unless listed above  PHYSICAL EXAM BP 120/58  Pulse 90  Ht 5\' 10"  (1.778 m)  Wt 173 lb (78.472 kg)  BMI 24.82 kg/m2 General: Well developed, well nourished, in no acute distress Head: Eyes PERRLA, No xanthomas.   Normal cephalic and atramatic  Lungs: Clear bilaterally to auscultation and percussion. Heart: HRRR S1 S2, without MRG.  Pulses are 2+ & equal.            No carotid bruit. No JVD.  No abdominal bruits. No femoral bruits. Abdomen: Bowel sounds are positive, hyperactive, abdomen soft and with tenderness, without masses or                  Hernia's noted. Msk:  Back normal, normal gait. Normal strength and tone for age. Extremities: No clubbing, cyanosis or edema.  DP +1 Neuro: Alert and oriented X 3. Psych:  Good affect, responds appropriately    ASSESSMENT AND PLAN

## 2013-10-13 NOTE — ED Provider Notes (Signed)
CSN: [id number]     Arrival date & time 10/13/13  1615 History  This chart was scribed for [name], MD by Allena Earing, ED Scribe. This patient was seen in room APA09/APA09 and the patient's care was started at 4:40 PM.    Chief Complaint  Patient presents with  . Diarrhea     HPI HPI Comments: Jeffrey [name] is a 80 y.o. male who presents to the Emergency Department complaining of diarrhea. He reports that he was admitted to the hospital 6 weeks ago for diarrhea. .Pt has experienced at least 4 episodes last night, he had to "throw away 4 pairs of shorts". He also reports that the stool is "dark". Pt also reports abdominal tenderness. No fever, chills, syncope, chest pain, dyspnea   Pt was going to see cardiologist when it was decided for him to come to the ED for diarrhea..   Past Medical History  Diagnosis Date  . Diabetes mellitus, type II   . Hyperlipidemia     Lipid profile in 02/2012:135, 227, 41, 49  . Coronary atherosclerosis of native coronary artery     a. CABG x 4 in 1989 (VG->OM1->OM2, VG->RCA, LIMA->LAD), b. 05/2010: DES to VG-OM1/OM2, DES to distal LCx. c. NSTEMI in 04/2011 - TO distal LCX stent and VG->OM2. d. 02/2012 NSTEMI DES to VG-OM1/continuation to OM2 occluded. e. inferior STEMI s/p DES to SVG-RAMUS 06/2012. f. inferolat STEMI 09/2012 s/p DES to SVG-interm; g. Lex MV (11/14):  EF 35%, inf-lat scar with small peri-infarct ischemia  . Essential hypertension, benign   . Osteoarthritis   . History of stroke   . History of pneumonia   . Cervical vertebral fracture   . Chronic back pain   . Benign prostatic hypertrophy     History of urinary retention  . Peptic ulcer disease   . Gastroesophageal reflux disease   . Ischemic cardiomyopathy     LVEF 45-50%; Echo (04/2013):  mild LVH, EF 50%, Gr 1 DD, inf-lat HK, inf-septal and ant-lat HK, Tr MR, mild LAE   Past Surgical History  Procedure Laterality Date  . Tonsillectomy    . Coronary artery bypass graft      Family History  Problem Relation Age of Onset  . Early death      Parents died young  . Appendicitis Mother     Pt was 36 year old  . Heart attack Father 43   History  Substance Use Topics  . Smoking status: Former Smoker -- 2.00 packs/day for 10 years    Types: Cigarettes    Quit date: 07/14/1961  . Smokeless tobacco: Current User    Types: Chew  . Alcohol Use: No    Review of Systems  Gastrointestinal: Positive for abdominal pain, diarrhea and blood in stool.      Allergies  Review of patient's allergies indicates no known allergies.  Home Medications   Current Outpatient Rx  Name  Route  Sig  Dispense  Refill  . albuterol (PROVENTIL HFA;VENTOLIN HFA) 108 (90 BASE) MCG/ACT inhaler   Inhalation   Inhale 2 puffs into the lungs every 6 (six) hours as needed for wheezing or shortness of breath.         Marland Kitchen aspirin EC 81 MG tablet   Oral   Take 81 mg by mouth daily.         Marland Kitchen atorvastatin (LIPITOR) 40 MG tablet   Oral   Take 1 tablet (40 mg total) by mouth daily at 6 PM.  30 tablet   11   . carvedilol (COREG) 3.125 MG tablet   Oral   Take 1 tablet (3.125 mg total) by mouth 2 (two) times daily with a meal.   60 tablet   3   . famotidine (PEPCID) 20 MG tablet   Oral   Take 1 tablet (20 mg total) by mouth 2 (two) times daily.         . ferrous sulfate 325 (65 FE) MG tablet   Oral   Take 325 mg by mouth daily with breakfast.         . furosemide (LASIX) 20 MG tablet   Oral   Take 20 mg by mouth daily as needed for fluid. Take 1 tablet as needed if 3 lb weight gain.         Marland Kitchen lisinopril (PRINIVIL,ZESTRIL) 2.5 MG tablet   Oral   Take 1 tablet (2.5 mg total) by mouth daily.   30 tablet   6   . metFORMIN (GLUCOPHAGE) 500 MG tablet   Oral   Take 500 mg by mouth 2 (two) times daily with a meal.         . Multiple Vitamin (MULTIVITAMIN WITH MINERALS) TABS   Oral   Take 1 tablet by mouth daily.         . potassium chloride SA  (K-DUR,KLOR-CON) 20 MEQ tablet   Oral   Take 20 mEq by mouth daily as needed (Takes only with Lasix).         . tamsulosin (FLOMAX) 0.4 MG CAPS   Oral   Take 0.4 mg by mouth daily after supper.          . Ticagrelor (BRILINTA) 90 MG TABS tablet   Oral   Take 1 tablet (90 mg total) by mouth 2 (two) times daily.   60 tablet   6   . diphenoxylate-atropine (LOMOTIL) 2.5-0.025 MG per tablet   Oral   Take 1 tablet by mouth 3 (three) times daily as needed for diarrhea or loose stools.   20 tablet   0   . nitroGLYCERIN (NITROSTAT) 0.4 MG SL tablet   Sublingual   Place 1 tablet (0.4 mg total) under the tongue every 5 (five) minutes x 3 doses as needed for chest pain.   25 tablet   3    Ht 5\' 10"  (1.778 m)  Wt 170 lb (77.111 kg)  BMI 24.39 kg/m2 Physical Exam  Nursing note and vitals reviewed. Constitutional: He is oriented to person, place, and time. He appears well-developed and well-nourished.  HENT:  Head: Normocephalic and atraumatic.  Eyes: Conjunctivae and EOM are normal. Pupils are equal, round, and reactive to light.  Neck: Normal range of motion. Neck supple.  Cardiovascular: Normal rate, regular rhythm and normal heart sounds.   Pulmonary/Chest: Effort normal and breath sounds normal.  Abdominal: Soft. Bowel sounds are normal.  Musculoskeletal: Normal range of motion.  Neurological: He is alert and oriented to person, place, and time.  Skin: Skin is warm and dry.  Psychiatric: He has a normal mood and affect. His behavior is normal.    ED Course  Procedures (including critical care time)  DIAGNOSTIC STUDIES:     COORDINATION OF CARE:  4:49 PM-Discussed treatment plan which includes labs, IV, and stool test with pt at bedside and pt agreed to plan.    Results for orders placed during the hospital encounter of 10/13/13  CBC WITH DIFFERENTIAL      Result Value Ref  Range   WBC 5.9  4.0 - 10.5 K/uL   RBC 3.77 (*) 4.22 - 5.81 MIL/uL   Hemoglobin 12.0 (*)  13.0 - 17.0 g/dL   HCT 35.7 (*) 39.0 - 52.0 %   MCV 94.7  78.0 - 100.0 fL   MCH 31.8  26.0 - 34.0 pg   MCHC 33.6  30.0 - 36.0 g/dL   RDW 13.2  11.5 - 15.5 %   Platelets 129 (*) 150 - 400 K/uL   Neutrophils Relative % 64  43 - 77 %   Neutro Abs 3.8  1.7 - 7.7 K/uL   Lymphocytes Relative 23  12 - 46 %   Lymphs Abs 1.3  0.7 - 4.0 K/uL   Monocytes Relative 9  3 - 12 %   Monocytes Absolute 0.5  0.1 - 1.0 K/uL   Eosinophils Relative 4  0 - 5 %   Eosinophils Absolute 0.2  0.0 - 0.7 K/uL   Basophils Relative 0  0 - 1 %   Basophils Absolute 0.0  0.0 - 0.1 K/uL  BASIC METABOLIC PANEL      Result Value Ref Range   Sodium 136 (*) 137 - 147 mEq/L   Potassium 4.3  3.7 - 5.3 mEq/L   Chloride 99  96 - 112 mEq/L   CO2 25  19 - 32 mEq/L   Glucose, Bld 201 (*) 70 - 99 mg/dL   BUN 15  6 - 23 mg/dL   Creatinine, Ser 0.91  0.50 - 1.35 mg/dL   Calcium 9.3  8.4 - 10.5 mg/dL   GFR calc non Af Amer 77 (*) >90 mL/min   GFR calc Af Amer 89 (*) >90 mL/min  POC OCCULT BLOOD, ED      Result Value Ref Range   Fecal Occult Bld NEGATIVE  NEGATIVE   No results found.      Labs Review Labs Reviewed  CBC WITH DIFFERENTIAL - Abnormal; Notable for the following:    RBC 3.77 (*)    Hemoglobin 12.0 (*)    HCT 35.7 (*)    Platelets 129 (*)    All other components within normal limits  BASIC METABOLIC PANEL - Abnormal; Notable for the following:    Sodium 136 (*)    Glucose, Bld 201 (*)    GFR calc non Af Amer 77 (*)    GFR calc Af Amer 89 (*)    All other components within normal limits  POC OCCULT BLOOD, ED   Imaging Review No results found.   EKG Interpretation None      MDM   Final diagnoses:  Diarrhea   I personally performed the services described in this documentation, which was scribed in my presence. The recorded information has been reviewed and is accurate.   Patient is hemodynamically stable. Minimal diarrhea in emergency department. Discharge medications Lomotil     [name], MD 10/13/13 2010

## 2013-10-13 NOTE — Patient Instructions (Addendum)
  Your physician recommends that you schedule a follow-up appointment in: 6 weeks [place] physician recommends that you continue on your current medications as directed. Please refer to the Current Medication list given to you today.   We are taking you to the Emergency room to evaluate your diarrhea since you recently has C-Diff infection

## 2013-10-13 NOTE — ED Notes (Signed)
Pt states he was at [facility] for checkup. States he told staff there he had been having diarrhea and stomach cramping. He was brought over to the ED for evaluation of possible  c-diff due to history

## 2013-10-13 NOTE — Progress Notes (Deleted)
Name: Jeffrey [name]    DOB: [birth year]  Age: 80 y.o.  MR#: [id number]       PCP:  [name]., MD      Insurance: Payor: MEDICARE / Plan: MEDICARE PART A AND B / Product Type: *No Product type* /   CC:   No chief complaint on file.   VS Filed Vitals:   10/13/13 1534  BP: 120/58  Pulse: 90  Height: 5\' 10"  (1.778 m)  Weight: 173 lb (78.472 kg)    Weights Current Weight  10/13/13 173 lb (78.472 kg)  09/23/13 165 lb (74.844 kg)  08/27/13 177 lb 0.5 oz (80.3 kg)    Blood Pressure  BP Readings from Last 3 Encounters:  10/13/13 120/58  09/23/13 104/68  09/01/13 135/87     Admit date:  (Not on file) Last encounter with RMR:  09/23/2013   Allergy Review of patient's allergies indicates no known allergies.  Current Outpatient Prescriptions  Medication Sig Dispense Refill  . albuterol (PROVENTIL HFA;VENTOLIN HFA) 108 (90 BASE) MCG/ACT inhaler Inhale 2 puffs into the lungs every 6 (six) hours as needed for wheezing or shortness of breath.      Marland Kitchen aspirin 81 MG tablet Take 1 tablet (81 mg total) by mouth daily.      . carvedilol (COREG) 3.125 MG tablet Take 1 tablet (3.125 mg total) by mouth 2 (two) times daily with a meal.  60 tablet  3  . famotidine (PEPCID) 20 MG tablet Take 1 tablet (20 mg total) by mouth 2 (two) times daily.      . ferrous sulfate 325 (65 FE) MG tablet Take 325 mg by mouth daily with breakfast.      . furosemide (LASIX) 20 MG tablet Take 20 mg by mouth daily as needed for fluid. Take 1 tablet as needed if 3 lb weight gain.      . metFORMIN (GLUCOPHAGE) 500 MG tablet Take 500 mg by mouth 2 (two) times daily with a meal.      . Multiple Vitamin (MULTIVITAMIN WITH MINERALS) TABS Take 1 tablet by mouth daily.      . nitroGLYCERIN (NITROSTAT) 0.4 MG SL tablet Place 1 tablet (0.4 mg total) under the tongue every 5 (five) minutes x 3 doses as needed for chest pain.  25 tablet  3  . potassium chloride SA (K-DUR,KLOR-CON) 20 MEQ tablet Take 20 mEq by mouth daily as  needed (Takes only with Lasix).      . tamsulosin (FLOMAX) 0.4 MG CAPS Take 0.4 mg by mouth daily after supper.       . Ticagrelor (BRILINTA) 90 MG TABS tablet Take 1 tablet (90 mg total) by mouth 2 (two) times daily.  60 tablet  6  . atorvastatin (LIPITOR) 40 MG tablet Take 1 tablet (40 mg total) by mouth daily at 6 PM.  30 tablet  11  . lisinopril (PRINIVIL,ZESTRIL) 2.5 MG tablet Take 1 tablet (2.5 mg total) by mouth daily.  30 tablet  6   No current facility-administered medications for this visit.    Discontinued Meds:    Medications Discontinued During This Encounter  Medication Reason  . ranolazine (RANEXA) 500 MG 12 hr tablet Error    Patient Active Problem List   Diagnosis Date Noted  . NSTEMI (non-ST elevated myocardial infarction) 08/29/2013  . Nausea & vomiting 08/27/2013  . Diarrhea 08/27/2013  . C. difficile diarrhea 08/27/2013  . Chest pain 06/16/2013  . Precordial chest pain 05/29/2013  . Coronary atherosclerosis  of native coronary artery 05/29/2013  . Noncompliance 11/22/2012  . Anemia, normocytic normochromic   . Cardiomyopathy, ischemic 06/21/2012  . Cerebrovascular disease 06/08/2012  . Diabetes mellitus, type II   . Hypertension   . Arteriosclerotic cardiovascular disease (ASCVD) 10/25/2011  . Hyperlipidemia 10/24/2011  . BPH (benign prostatic hyperplasia) 05/07/2011    LABS    Component Value Date/Time   NA 134* 09/01/2013 0526   NA 134* 08/31/2013 0457   NA 134* 08/30/2013 0546   K 4.2 09/01/2013 0526   K 3.8 08/31/2013 0457   K 4.1 08/30/2013 0546   CL 101 09/01/2013 0526   CL 99 08/31/2013 0457   CL 100 08/30/2013 0546   CO2 21 09/01/2013 0526   CO2 22 08/31/2013 0457   CO2 20 08/30/2013 0546   GLUCOSE 254* 09/01/2013 0526   GLUCOSE 186* 08/31/2013 0457   GLUCOSE 257* 08/30/2013 0546   BUN 27* 09/01/2013 0526   BUN 24* 08/31/2013 0457   BUN 20 08/30/2013 0546   CREATININE 0.85 09/01/2013 0526   CREATININE 0.91 08/31/2013 0457   CREATININE 0.95 08/30/2013  0546   CREATININE 1.19 04/27/2013 1140   CREATININE 0.98 12/04/2011 1208   CALCIUM 8.4 09/01/2013 0526   CALCIUM 8.4 08/31/2013 0457   CALCIUM 8.4 08/30/2013 0546   GFRNONAA 79* 09/01/2013 0526   GFRNONAA 77* 08/31/2013 0457   GFRNONAA 75* 08/30/2013 0546   GFRAA >90 09/01/2013 0526   GFRAA 89* 08/31/2013 0457   GFRAA 87* 08/30/2013 0546   CMP     Component Value Date/Time   NA 134* 09/01/2013 0526   K 4.2 09/01/2013 0526   CL 101 09/01/2013 0526   CO2 21 09/01/2013 0526   GLUCOSE 254* 09/01/2013 0526   BUN 27* 09/01/2013 0526   CREATININE 0.85 09/01/2013 0526   CREATININE 1.19 04/27/2013 1140   CALCIUM 8.4 09/01/2013 0526   PROT 6.6 08/27/2013 0616   ALBUMIN 3.5 08/27/2013 0759   AST 20 08/27/2013 0616   ALT 22 08/27/2013 0616   ALKPHOS 75 08/27/2013 0616   BILITOT 0.5 08/27/2013 0616   GFRNONAA 79* 09/01/2013 0526   GFRAA >90 09/01/2013 0526       Component Value Date/Time   WBC 9.5 08/31/2013 0457   WBC 9.8 08/30/2013 0546   WBC 10.0 08/29/2013 0521   HGB 11.7* 08/31/2013 0457   HGB 12.9* 08/30/2013 0546   HGB 12.5* 08/29/2013 0521   HCT 34.6* 08/31/2013 0457   HCT 37.7* 08/30/2013 0546   HCT 36.6* 08/29/2013 0521   MCV 97.2 08/31/2013 0457   MCV 97.2 08/30/2013 0546   MCV 98.1 08/29/2013 0521    Lipid Panel     Component Value Date/Time   CHOL 190 05/29/2013 0410   TRIG 335* 05/29/2013 0410   HDL 33* 05/29/2013 0410   CHOLHDL 5.8 05/29/2013 0410   VLDL 67* 05/29/2013 0410   LDLCALC 90 05/29/2013 0410    ABG    Component Value Date/Time   TCO2 23 10/06/2012 1545     Lab Results  Component Value Date   TSH 3.483 10/16/2012   BNP (last 3 results)  Recent Labs  04/27/13 1140 08/17/13 2032  PROBNP 1023.00* 1587.0*   Cardiac Panel (last 3 results) No results found for this basename: CKTOTAL, CKMB, TROPONINI, RELINDX,  in the last 72 hours  Iron/TIBC/Ferritin No results found for this basename: iron, tibc, ferritin     EKG Orders placed during the hospital encounter of  08/26/13  . EKG 12-LEAD  .  EKG 12-LEAD  . EKG 12-LEAD  . EKG 12-LEAD  . EKG     Prior Assessment and Plan Problem List as of 10/13/2013     Cardiovascular and Mediastinum   Arteriosclerotic cardiovascular disease (ASCVD)   Last Assessment & Plan   09/23/2013 Office Visit Written 09/23/2013  2:16 PM by Lendon Colonel, NP     Continues to be pain free. He is currently at [facility] for rehab. He is increasing his activity slowly. No pain with PT    Hypertension   Last Assessment & Plan   09/23/2013 Office Visit Written 09/23/2013  2:20 PM by Lendon Colonel, NP     I am concerned that he might be on too much antihypertensive medication. He is on coreg 3.15 mg BID and lisinopril 5 mg daily. Will significant deconditioning and sedentary lifestyle, he may be dropping BP too much after taking the medications. I will decrease the lisinopril to 2.5 mg daily. He is to have orthostatic BP completed daily. Parameters to call us for drop in BP 20 mmHg or more. Hold lisinopril for BP ,/= 100 mmHg.   Labs have recently been drawn, and will request copies be sent to [place].     Cerebrovascular disease   Last Assessment & Plan   09/09/2012 Office Visit Written 09/09/2012  3:42 PM by Yehuda Savannah, MD     Recent carotid ultrasound negative for obstruction. Optimal control of cardiovascular risk factors appears effective in preventing progression of disease and will be continued.    Cardiomyopathy, ischemic   Last Assessment & Plan   05/06/2013 Office Visit Written 05/06/2013  2:05 PM by Lendon Colonel, NP     Improved EF per echo this month to 50%. No further evidence of fluid retention. He is breathing better without edema and is down 5 lbs. I have asked him to weigh himself daily. If he gain 3 lbs in 24 hours. He is to take one dose of lasix with potasium that day. He is not to take it on a daily basis. He is reminded of low sodium diet. We will see him in 6 months.    Coronary  atherosclerosis of native coronary artery   NSTEMI (non-ST elevated myocardial infarction)     Digestive   Nausea & vomiting   C. difficile diarrhea     Endocrine   Diabetes mellitus, type II   Last Assessment & Plan   04/27/2013 Office Visit Written 04/27/2013  1:54 PM by Lendon Colonel, NP     Followed by PCP      Genitourinary   BPH (benign prostatic hyperplasia)   Last Assessment & Plan   09/09/2012 Office Visit Edited 09/11/2012 11:21 PM by Yehuda Savannah, MD     Patient advised not to increase his dose of Flomax or other medications without specific instructions and will resume his usual daily dose.  He is scheduled to return to see Dr. Michela Pitcher in the near future for catheter removal.      Other   Hyperlipidemia   Last Assessment & Plan   05/06/2013 Office Visit Written 05/06/2013  2:06 PM by Lendon Colonel, NP     Will need to have follow up lipids and LFT's in 3 months.    Anemia, normocytic normochromic   Last Assessment & Plan   04/27/2013 Office Visit Written 04/27/2013  1:55 PM by Lendon Colonel, NP     Stable. Most recent lab work last week.  Continues on iron replacement.    Noncompliance   Last Assessment & Plan   04/27/2013 Office Visit Written 04/27/2013  1:54 PM by Lendon Colonel, NP     He is not adhering to low sodium diet. I have reinforced this to him. He is to weigh daily.    Precordial chest pain   Chest pain   Diarrhea       Imaging: No results found.

## 2013-10-13 NOTE — Assessment & Plan Note (Signed)
Sent to [facility] ER from our office to do to frequent stooling abdominal pain and known history of same 2 months ago. Contact precautions are recommended to ER staff over the phone on report to them concerning our transport to their clinic.

## 2013-10-14 ENCOUNTER — Ambulatory Visit: Payer: Medicare Other | Admitting: Adult Health

## 2013-11-01 ENCOUNTER — Telehealth: Payer: Self-pay | Admitting: *Deleted

## 2013-11-01 MED ORDER — RANOLAZINE ER 500 MG PO TB12: 500.0000 mg | ORAL_TABLET | Freq: Two times a day (BID) | ORAL | Status: DC

## 2013-11-01 NOTE — Telephone Encounter (Signed)
PT stopped by the office to see if he still needs to take renexa. Pt states he just got out of avante and is out of this pill. States that since he is already taking other heart medications he is unsure about this one.   Please call to [employer] if he should still need to be on it. Please call patient back also

## 2013-11-01 NOTE — Telephone Encounter (Signed)
Pt looks like he is off Renexa. I do not see in the office note where he was put off of this medication. Please advise if he suppose to be on this mediation.

## 2013-11-01 NOTE — Telephone Encounter (Signed)
Recommend he stay on it.

## 2013-11-01 NOTE — Telephone Encounter (Signed)
Made pt aware . Medication sent via escribe.

## 2013-11-05 ENCOUNTER — Emergency Department (HOSPITAL_COMMUNITY): Payer: Medicare Other

## 2013-11-05 ENCOUNTER — Inpatient Hospital Stay (HOSPITAL_COMMUNITY)
Admission: EM | Admit: 2013-11-05 | Discharge: 2013-11-06 | DRG: 373 | Disposition: A | Discharge status: Home / Self Care | Payer: Medicare Other | Attending: Internal Medicine | Admitting: Internal Medicine

## 2013-11-05 ENCOUNTER — Encounter (HOSPITAL_COMMUNITY): Payer: Self-pay | Admitting: Emergency Medicine

## 2013-11-05 DIAGNOSIS — Z951 Presence of aortocoronary bypass graft: Secondary | ICD-10-CM

## 2013-11-05 DIAGNOSIS — M549 Dorsalgia, unspecified: Secondary | ICD-10-CM | POA: Diagnosis present

## 2013-11-05 DIAGNOSIS — R197 Diarrhea, unspecified: Secondary | ICD-10-CM

## 2013-11-05 DIAGNOSIS — M1990 Unspecified osteoarthritis, unspecified site: Secondary | ICD-10-CM | POA: Diagnosis present

## 2013-11-05 DIAGNOSIS — I7090 Unspecified atherosclerosis: Secondary | ICD-10-CM

## 2013-11-05 DIAGNOSIS — R072 Precordial pain: Secondary | ICD-10-CM

## 2013-11-05 DIAGNOSIS — G8929 Other chronic pain: Secondary | ICD-10-CM | POA: Diagnosis present

## 2013-11-05 DIAGNOSIS — N400 Enlarged prostate without lower urinary tract symptoms: Secondary | ICD-10-CM | POA: Diagnosis present

## 2013-11-05 DIAGNOSIS — K219 Gastro-esophageal reflux disease without esophagitis: Secondary | ICD-10-CM | POA: Diagnosis present

## 2013-11-05 DIAGNOSIS — E119 Type 2 diabetes mellitus without complications: Secondary | ICD-10-CM | POA: Diagnosis present

## 2013-11-05 DIAGNOSIS — I2510 Atherosclerotic heart disease of native coronary artery without angina pectoris: Secondary | ICD-10-CM | POA: Diagnosis present

## 2013-11-05 DIAGNOSIS — Z9861 Coronary angioplasty status: Secondary | ICD-10-CM

## 2013-11-05 DIAGNOSIS — Z8673 Personal history of transient ischemic attack (TIA), and cerebral infarction without residual deficits: Secondary | ICD-10-CM

## 2013-11-05 DIAGNOSIS — I2589 Other forms of chronic ischemic heart disease: Secondary | ICD-10-CM | POA: Diagnosis present

## 2013-11-05 DIAGNOSIS — E785 Hyperlipidemia, unspecified: Secondary | ICD-10-CM | POA: Diagnosis present

## 2013-11-05 DIAGNOSIS — Z7982 Long term (current) use of aspirin: Secondary | ICD-10-CM

## 2013-11-05 DIAGNOSIS — A498 Other bacterial infections of unspecified site: Secondary | ICD-10-CM

## 2013-11-05 DIAGNOSIS — I252 Old myocardial infarction: Secondary | ICD-10-CM

## 2013-11-05 DIAGNOSIS — Z8249 Family history of ischemic heart disease and other diseases of the circulatory system: Secondary | ICD-10-CM

## 2013-11-05 DIAGNOSIS — Z8701 Personal history of pneumonia (recurrent): Secondary | ICD-10-CM

## 2013-11-05 DIAGNOSIS — I10 Essential (primary) hypertension: Secondary | ICD-10-CM | POA: Diagnosis present

## 2013-11-05 DIAGNOSIS — A0472 Enterocolitis due to Clostridium difficile, not specified as recurrent: Secondary | ICD-10-CM | POA: Diagnosis present

## 2013-11-05 DIAGNOSIS — Z87891 Personal history of nicotine dependence: Secondary | ICD-10-CM

## 2013-11-05 DIAGNOSIS — R079 Chest pain, unspecified: Secondary | ICD-10-CM

## 2013-11-05 DIAGNOSIS — IMO0001 Reserved for inherently not codable concepts without codable children: Secondary | ICD-10-CM

## 2013-11-05 LAB — CBC
HCT: 38.7 % — ABNORMAL LOW (ref 39.0–52.0)
HEMOGLOBIN: 13.2 g/dL (ref 13.0–17.0)
MCH: 32.1 pg (ref 26.0–34.0)
MCHC: 34.1 g/dL (ref 30.0–36.0)
MCV: 94.2 fL (ref 78.0–100.0)
Platelets: 184 10*3/uL (ref 150–400)
RBC: 4.11 MIL/uL — AB (ref 4.22–5.81)
RDW: 13.9 % (ref 11.5–15.5)
WBC: 6.5 10*3/uL (ref 4.0–10.5)

## 2013-11-05 LAB — BASIC METABOLIC PANEL
BUN: 16 mg/dL (ref 6–23)
CO2: 25 meq/L (ref 19–32)
Calcium: 9.6 mg/dL (ref 8.4–10.5)
Chloride: 97 mEq/L (ref 96–112)
Creatinine, Ser: 0.97 mg/dL (ref 0.50–1.35)
GFR calc Af Amer: 87 mL/min — ABNORMAL LOW (ref >90)
GFR, EST NON AFRICAN AMERICAN: 75 mL/min — AB (ref >90)
GLUCOSE: 180 mg/dL — AB (ref 70–99)
Potassium: 4 mEq/L (ref 3.7–5.3)
SODIUM: 137 meq/L (ref 137–147)

## 2013-11-05 LAB — CLOSTRIDIUM DIFFICILE BY PCR: CDIFFPCR: POSITIVE — AB

## 2013-11-05 LAB — GLUCOSE, CAPILLARY
GLUCOSE-CAPILLARY: 200 mg/dL — AB (ref 70–99)
Glucose-Capillary: 173 mg/dL — ABNORMAL HIGH (ref 70–99)
Glucose-Capillary: 203 mg/dL — ABNORMAL HIGH (ref 70–99)
Glucose-Capillary: 229 mg/dL — ABNORMAL HIGH (ref 70–99)

## 2013-11-05 LAB — TROPONIN I
Troponin I: <0.3 ng/mL (ref <0.30)
Troponin I: <0.3 ng/mL (ref <0.30)
Troponin I: <0.3 ng/mL (ref <0.30)

## 2013-11-05 LAB — PROTIME-INR
INR: 0.92 (ref 0.00–1.49)
Prothrombin Time: 12.2 seconds (ref 11.6–15.2)

## 2013-11-05 LAB — MRSA PCR SCREENING: MRSA by PCR: NEGATIVE

## 2013-11-05 MED ORDER — SODIUM CHLORIDE 0.9 % IV SOLN
INTRAVENOUS | Status: AC
  Administered 2013-11-05: 07:00:00 via INTRAVENOUS

## 2013-11-05 MED ORDER — METRONIDAZOLE 500 MG PO TABS
500.0000 mg | ORAL_TABLET | Freq: Three times a day (TID) | ORAL | Status: DC
Start: 2013-11-05 — End: 2013-11-06
  Administered 2013-11-05 – 2013-11-06 (×3): 500 mg via ORAL
  Filled 2013-11-05 (×3): qty 1

## 2013-11-05 MED ORDER — ATORVASTATIN CALCIUM 40 MG PO TABS
40.0000 mg | ORAL_TABLET | Freq: Every day | ORAL | Status: DC
  Administered 2013-11-05: 40 mg via ORAL
  Filled 2013-11-05: qty 1

## 2013-11-05 MED ORDER — RANOLAZINE ER 500 MG PO TB12
500.0000 mg | ORAL_TABLET | Freq: Two times a day (BID) | ORAL | Status: DC
  Administered 2013-11-05 – 2013-11-06 (×3): 500 mg via ORAL
  Filled 2013-11-05 (×5): qty 1

## 2013-11-05 MED ORDER — NITROGLYCERIN 0.4 MG SL SUBL
0.4000 mg | SUBLINGUAL_TABLET | SUBLINGUAL | Status: DC | PRN
  Filled 2013-11-05: qty 1

## 2013-11-05 MED ORDER — ALBUTEROL SULFATE (2.5 MG/3ML) 0.083% IN NEBU: 2.5000 mg | INHALATION_SOLUTION | RESPIRATORY_TRACT | Status: DC | PRN

## 2013-11-05 MED ORDER — TAMSULOSIN HCL 0.4 MG PO CAPS
0.4000 mg | ORAL_CAPSULE | Freq: Every day | ORAL | Status: DC
  Administered 2013-11-05: 0.4 mg via ORAL
  Filled 2013-11-05: qty 1

## 2013-11-05 MED ORDER — METRONIDAZOLE 500 MG PO TABS
500.0000 mg | ORAL_TABLET | Freq: Once | ORAL | Status: AC
  Administered 2013-11-05: 500 mg via ORAL
  Filled 2013-11-05: qty 1

## 2013-11-05 MED ORDER — HEPARIN (PORCINE) IN NACL 100-0.45 UNIT/ML-% IJ SOLN
12.0000 [IU]/kg/h | INTRAMUSCULAR | Status: DC
  Administered 2013-11-05: 12 [IU]/kg/h via INTRAVENOUS
  Filled 2013-11-05: qty 250

## 2013-11-05 MED ORDER — ONDANSETRON HCL 4 MG/2ML IJ SOLN: 4.0000 mg | Freq: Four times a day (QID) | INTRAMUSCULAR | Status: DC | PRN

## 2013-11-05 MED ORDER — ONDANSETRON HCL 4 MG PO TABS: 4.0000 mg | ORAL_TABLET | Freq: Four times a day (QID) | ORAL | Status: DC | PRN

## 2013-11-05 MED ORDER — TICAGRELOR 90 MG PO TABS
90.0000 mg | ORAL_TABLET | Freq: Two times a day (BID) | ORAL | Status: DC
  Administered 2013-11-05 – 2013-11-06 (×3): 90 mg via ORAL
  Filled 2013-11-05 (×5): qty 1

## 2013-11-05 MED ORDER — INSULIN ASPART 100 UNIT/ML ~~LOC~~ SOLN
0.0000 [IU] | Freq: Three times a day (TID) | SUBCUTANEOUS | Status: DC
  Administered 2013-11-05: 3 [IU] via SUBCUTANEOUS
  Administered 2013-11-05: 5 [IU] via SUBCUTANEOUS
  Administered 2013-11-05 – 2013-11-06 (×2): 3 [IU] via SUBCUTANEOUS

## 2013-11-05 MED ORDER — SODIUM CHLORIDE 0.9 % IJ SOLN
3.0000 mL | Freq: Two times a day (BID) | INTRAMUSCULAR | Status: DC
  Administered 2013-11-05 – 2013-11-06 (×2): 3 mL via INTRAVENOUS

## 2013-11-05 MED ORDER — FAMOTIDINE 20 MG PO TABS
20.0000 mg | ORAL_TABLET | Freq: Two times a day (BID) | ORAL | Status: DC
  Administered 2013-11-05 – 2013-11-06 (×3): 20 mg via ORAL
  Filled 2013-11-05 (×3): qty 1

## 2013-11-05 MED ORDER — ASPIRIN 81 MG PO CHEW
324.0000 mg | CHEWABLE_TABLET | Freq: Once | ORAL | Status: AC
  Administered 2013-11-05: 324 mg via ORAL
  Filled 2013-11-05: qty 4

## 2013-11-05 MED ORDER — HEPARIN BOLUS VIA INFUSION
4000.0000 [IU] | Freq: Once | INTRAVENOUS | Status: AC
  Administered 2013-11-05: 4000 [IU] via INTRAVENOUS

## 2013-11-05 MED ORDER — ASPIRIN EC 81 MG PO TBEC
81.0000 mg | DELAYED_RELEASE_TABLET | Freq: Every day | ORAL | Status: DC
  Administered 2013-11-05 – 2013-11-06 (×2): 81 mg via ORAL
  Filled 2013-11-05 (×2): qty 1

## 2013-11-05 MED ORDER — ACETAMINOPHEN 325 MG PO TABS: 650.0000 mg | ORAL_TABLET | Freq: Four times a day (QID) | ORAL | Status: DC | PRN

## 2013-11-05 MED ORDER — CARVEDILOL 3.125 MG PO TABS
3.1250 mg | ORAL_TABLET | Freq: Two times a day (BID) | ORAL | Status: DC
  Filled 2013-11-05: qty 1

## 2013-11-05 MED ORDER — LISINOPRIL 5 MG PO TABS
2.5000 mg | ORAL_TABLET | Freq: Every day | ORAL | Status: DC
  Filled 2013-11-05: qty 1

## 2013-11-05 MED ORDER — ACETAMINOPHEN 650 MG RE SUPP: 650.0000 mg | Freq: Four times a day (QID) | RECTAL | Status: DC | PRN

## 2013-11-05 NOTE — Progress Notes (Signed)
Patient seen and examined, database reviewed. Admitted earlier today for diarrhea. C Diff PCR is positive. Is on flagyl per c Diff protocol for 1st recurrent episode. Has had no further diarrhea since admission. VSS. Has a h/o orthostatic hypotension. Will DC BP meds. Continue to follow closely.  Domingo Mend, MD [facility] Pager: [phone]

## 2013-11-05 NOTE — ED Provider Notes (Signed)
CSN: [id number]     Arrival date & time 11/05/13  0250 History   First MD Initiated Contact with Patient 11/05/13 0259     Chief Complaint  Patient presents with  . Chest Pain  . Diarrhea     (Consider location/radiation/quality/duration/timing/severity/associated sxs/prior Treatment) HPI Comments: 80 year old male with a history of Clostridium difficile colitis who was admitted to the hospital in February of 2015. During that time he was treated for his infectious colitis but unfortunately developed a non-ST elevation MI as a result of what was thought to be demand ischemia. Because of his extensive cardiac history including coronary artery bypass grafting as well as multiple stenting procedures it was deemed best practice for the patient to be treated conservatively and he has done reasonably well since that time. He reports that he had recurrence of his diarrhea earlier yesterday, has had multiple episodes of watery diarrhea since that time. He states that it is dark. He has now developed associated chest heaviness which has been persistent for the last couple of hours. Nothing seems to make this better or worse, there is no associated nausea or shortness of breath or fevers. He states this felt similar to when he was admitted to the hospital.  Patient is a 80 y.o. male presenting with chest pain and diarrhea. The history is provided by the patient and medical records.  Chest Pain Diarrhea   Past Medical History  Diagnosis Date  . Diabetes mellitus, type II   . Hyperlipidemia     Lipid profile in 02/2012:135, 227, 41, 49  . Coronary atherosclerosis of native coronary artery     a. CABG x 4 in 1989 (VG->OM1->OM2, VG->RCA, LIMA->LAD), b. 05/2010: DES to VG-OM1/OM2, DES to distal LCx. c. NSTEMI in 04/2011 - TO distal LCX stent and VG->OM2. d. 02/2012 NSTEMI DES to VG-OM1/continuation to OM2 occluded. e. inferior STEMI s/p DES to SVG-RAMUS 06/2012. f. inferolat STEMI 09/2012 s/p DES to  SVG-interm; g. Lex MV (11/14):  EF 35%, inf-lat scar with small peri-infarct ischemia  . Essential hypertension, benign   . Osteoarthritis   . History of stroke   . History of pneumonia   . Cervical vertebral fracture   . Chronic back pain   . Benign prostatic hypertrophy     History of urinary retention  . Peptic ulcer disease   . Gastroesophageal reflux disease   . Ischemic cardiomyopathy     LVEF 45-50%; Echo (04/2013):  mild LVH, EF 50%, Gr 1 DD, inf-lat HK, inf-septal and ant-lat HK, Tr MR, mild LAE   Past Surgical History  Procedure Laterality Date  . Tonsillectomy    . Coronary artery bypass graft     Family History  Problem Relation Age of Onset  . Early death      Parents died young  . Appendicitis Mother     Pt was 36 year old  . Heart attack Father 15   History  Substance Use Topics  . Smoking status: Former Smoker -- 2.00 packs/day for 10 years    Types: Cigarettes    Quit date: 07/14/1961  . Smokeless tobacco: Current User    Types: Chew  . Alcohol Use: No    Review of Systems  Cardiovascular: Positive for chest pain.  Gastrointestinal: Positive for diarrhea.  All other systems reviewed and are negative.     Allergies  Review of patient's allergies indicates no known allergies.  Home Medications   Prior to Admission medications   Medication Sig Start Date  End Date Taking? Authorizing Provider  albuterol (PROVENTIL HFA;VENTOLIN HFA) 108 (90 BASE) MCG/ACT inhaler Inhale 2 puffs into the lungs every 6 (six) hours as needed for wheezing or shortness of breath.    Historical Provider, MD  aspirin EC 81 MG tablet Take 81 mg by mouth daily.    Historical Provider, MD  atorvastatin (LIPITOR) 40 MG tablet Take 1 tablet (40 mg total) by mouth daily at 6 PM. 05/29/13   Liliane Shi, PA-C  carvedilol (COREG) 3.125 MG tablet Take 1 tablet (3.125 mg total) by mouth 2 (two) times daily with a meal. 05/22/13   Brooke O Edmisten, PA-C  diphenoxylate-atropine  (LOMOTIL) 2.5-0.025 MG per tablet Take 1 tablet by mouth 3 (three) times daily as needed for diarrhea or loose stools. 10/13/13   [name], MD  famotidine (PEPCID) 20 MG tablet Take 1 tablet (20 mg total) by mouth 2 (two) times daily. 09/01/13   Samuella Cota, MD  ferrous sulfate 325 (65 FE) MG tablet Take 325 mg by mouth daily with breakfast.    Historical Provider, MD  furosemide (LASIX) 20 MG tablet Take 20 mg by mouth daily as needed for fluid. Take 1 tablet as needed if 3 lb weight gain. 05/06/13   Lendon Colonel, NP  lisinopril (PRINIVIL,ZESTRIL) 2.5 MG tablet Take 1 tablet (2.5 mg total) by mouth daily. 09/23/13   Lendon Colonel, NP  metFORMIN (GLUCOPHAGE) 500 MG tablet Take 500 mg by mouth 2 (two) times daily with a meal.    Historical Provider, MD  Multiple Vitamin (MULTIVITAMIN WITH MINERALS) TABS Take 1 tablet by mouth daily.    Historical Provider, MD  nitroGLYCERIN (NITROSTAT) 0.4 MG SL tablet Place 1 tablet (0.4 mg total) under the tongue every 5 (five) minutes x 3 doses as needed for chest pain. 11/11/12 11/11/13  Rhonda G Barrett, PA-C  potassium chloride SA (K-DUR,KLOR-CON) 20 MEQ tablet Take 20 mEq by mouth daily as needed (Takes only with Lasix). 04/28/13   Lendon Colonel, NP  ranolazine (RANEXA) 500 MG 12 hr tablet Take 1 tablet (500 mg total) by mouth 2 (two) times daily. 11/01/13   Herminio Commons, MD  tamsulosin (FLOMAX) 0.4 MG CAPS Take 0.4 mg by mouth daily after supper.  01/27/13   Historical Provider, MD  Ticagrelor (BRILINTA) 90 MG TABS tablet Take 1 tablet (90 mg total) by mouth 2 (two) times daily. 06/21/12   Roger A Arguello, PA-C   BP 120/69  Pulse 83  Temp(Src) 97.5 F (36.4 C) (Oral)  Resp 22  Ht 5\' 10"  (1.778 m)  Wt 170 lb (77.111 kg)  BMI 24.39 kg/m2  SpO2 99% Physical Exam  Nursing note and vitals reviewed. Constitutional: He appears well-developed and well-nourished. No distress.  HENT:  Head: Normocephalic and atraumatic.  Mouth/Throat:  Oropharynx is clear and moist. No oropharyngeal exudate.  Eyes: Conjunctivae and EOM are normal. Pupils are equal, round, and reactive to light. Right eye exhibits no discharge. Left eye exhibits no discharge. No scleral icterus.  Neck: Normal range of motion. Neck supple. No JVD present. No thyromegaly present.  Cardiovascular: Normal rate, regular rhythm, normal heart sounds and intact distal pulses.  Exam reveals no gallop and no friction rub.   No murmur heard. Pulmonary/Chest: Effort normal and breath sounds normal. No respiratory distress. He has no wheezes. He has no rales.  Abdominal: Soft. Bowel sounds are normal. He exhibits no distension and no mass. There is no tenderness.  Musculoskeletal: Normal range  of motion. He exhibits no edema and no tenderness.  Lymphadenopathy:    He has no cervical adenopathy.  Neurological: He is alert. Coordination normal.  Skin: Skin is warm and dry. No rash noted. No erythema.  Psychiatric: He has a normal mood and affect. His behavior is normal.    ED Course  Procedures (including critical care time) Labs Review Labs Reviewed  CBC - Abnormal; Notable for the following:    RBC 4.11 (*)    HCT 38.7 (*)    All other components within normal limits  BASIC METABOLIC PANEL - Abnormal; Notable for the following:    Glucose, Bld 180 (*)    GFR calc non Af Amer 75 (*)    GFR calc Af Amer 87 (*)    All other components within normal limits  CLOSTRIDIUM DIFFICILE BY PCR  TROPONIN I  PROTIME-INR    Imaging Review Dg Chest Port 1 View  11/05/2013   CLINICAL DATA:  Chest pain.  EXAM: PORTABLE CHEST - 1 VIEW  COMPARISON:  Chest radiograph performed 08/26/2013  FINDINGS: The lungs are well-aerated. Mild vascular congestion is noted. Minimal left basilar atelectasis is seen. There is no evidence of pleural effusion or pneumothorax.  The cardiomediastinal silhouette is normal in size. The patient is status post median sternotomy. No acute osseous  abnormalities are seen.  IMPRESSION: Mild vascular congestion noted. Minimal left basilar atelectasis seen. Lungs otherwise clear.   Electronically Signed   By: Garald Balding M.D.   On: 11/05/2013 04:43    ED ECG REPORT  I personally interpreted this EKG   Date: 11/05/2013   Rate: 87  Rhythm: normal sinus rhythm  QRS Axis: normal  Intervals: normal  ST/T Wave abnormalities: nonspecific T wave changes  Conduction Disutrbances:none  Narrative Interpretation:   Old EKG Reviewed: Over the last 4 months the patient has had essentially no changes in his EKG.   MDM   Final diagnoses:  Chest pain  Diarrhea    The patient has no tachycardia, no hypotension and a soft abdomen. His bowel sounds are increased. He will need further evaluation for sources of his symptoms with EKG, chest x-ray and laboratory workup. The patient is in agreement with the plan, I would consider him high risk for ongoing ischemic event secondary to his severe heart disease. Because of this I will go ahead and treat the patient with aspirin and heparin and nitroglycerin.  0445 - pt is now CP free, he is still having mutliple episodes of diarrhea - have sent for C dif, VS remain stable and ECG and Trop are not indicative of NSTEMI at this time - possible UA - started heparin gtt.  D/w Dr. [place] Pink who will evaluate for possible admission.  Meds given in ED:  Medications  nitroGLYCERIN (NITROSTAT) SL tablet 0.4 mg (not administered)  heparin ADULT infusion 100 units/mL (25000 units/250 mL) (12 Units/kg/hr  77.1 kg Intravenous New Bag/Given 11/05/13 0414)  aspirin chewable tablet 324 mg (324 mg Oral Given 11/05/13 0414)  heparin bolus via infusion 4,000 Units (0 Units Intravenous Stopped 11/05/13 0421)      Johnna Acosta, MD 11/05/13 [phone]

## 2013-11-05 NOTE — H&P (Signed)
[facility] History and Physical  Jeffrey [name]:[id number] DOB: [birth year] DOA: 11/05/2013   PCP: Jeffrey [name]., MD He is in the process of changing his PCP Specialists: Followed by Dr. Roderic [name] branch, with cardiology  Chief Complaint: Diarrhea since last night, and chest pain, just before coming to the emergency department  HPI: Jeffrey [name] is a 80 y.o. male with a past medical history of coronary artery disease, status post CABG in the past, diabetes, hypertension, recent hospitalization for C. difficile Colitis, and for non-ST elevation MI, thought to be secondary to demand ischemia. Patient had lost a lot of weight and had become deconditioned during that hospitalization and had to go to a skilled nursing facility for rehabilitation. He came back home from the skilled nursing facility about 2 weeks ago, and was doing well for the most part. He started having diarrhea at 8 PM last night. He must have had about 10 episodes of loose, green-colored stool. Denies any blood in the stool. No abdominal pain. No nausea, vomiting, no fever. No chills. And while he was getting ready to come to the hospital he started having tightness in his chest associated with some shortness of breath. Denies any dizziness or lightheadedness. It was located in the center part of his chest and towards the left side. He was given aspirin and nitroglycerin in the ED, and his chest pain has resolved.  Home Medications: Prior to Admission medications   Medication Sig Start Date End Date Taking? Authorizing Provider  albuterol (PROVENTIL HFA;VENTOLIN HFA) 108 (90 BASE) MCG/ACT inhaler Inhale 2 puffs into the lungs every 6 (six) hours as needed for wheezing or shortness of breath.    Historical Provider, MD  aspirin EC 81 MG tablet Take 81 mg by mouth daily.    Historical Provider, MD  atorvastatin (LIPITOR) 40 MG tablet Take 1 tablet (40 mg total) by mouth daily at 6 PM. 05/29/13   Jeffrey Shi, PA-C    carvedilol (COREG) 3.125 MG tablet Take 1 tablet (3.125 mg total) by mouth 2 (two) times daily with a meal. 05/22/13   Jeffrey O Edmisten, PA-C  diphenoxylate-atropine (LOMOTIL) 2.5-0.025 MG per tablet Take 1 tablet by mouth 3 (three) times daily as needed for diarrhea or loose stools. 10/13/13   Jeffrey Christen, MD  famotidine (PEPCID) 20 MG tablet Take 1 tablet (20 mg total) by mouth 2 (two) times daily. 09/01/13   Jeffrey Cota, MD  ferrous sulfate 325 (65 FE) MG tablet Take 325 mg by mouth daily with breakfast.    Historical Provider, MD  furosemide (LASIX) 20 MG tablet Take 20 mg by mouth daily as needed for fluid. Take 1 tablet as needed if 3 lb weight gain. 05/06/13   Jeffrey Colonel, NP  lisinopril (PRINIVIL,ZESTRIL) 2.5 MG tablet Take 1 tablet (2.5 mg total) by mouth daily. 09/23/13   Jeffrey Colonel, NP  metFORMIN (GLUCOPHAGE) 500 MG tablet Take 500 mg by mouth 2 (two) times daily with a meal.    Historical Provider, MD  Multiple Vitamin (MULTIVITAMIN WITH MINERALS) TABS Take 1 tablet by mouth daily.    Historical Provider, MD  nitroGLYCERIN (NITROSTAT) 0.4 MG SL tablet Place 1 tablet (0.4 mg total) under the tongue every 5 (five) minutes x 3 doses as needed for chest pain. 11/11/12 11/11/13  Rhonda G Barrett, PA-C  potassium chloride SA (K-DUR,KLOR-CON) 20 MEQ tablet Take 20 mEq by mouth daily as needed (Takes only with Lasix). 04/28/13   Jeffrey Colonel,  NP  ranolazine (RANEXA) 500 MG 12 hr tablet Take 1 tablet (500 mg total) by mouth 2 (two) times daily. 11/01/13   Jeffrey Commons, MD  tamsulosin (FLOMAX) 0.4 MG CAPS Take 0.4 mg by mouth daily after supper.  01/27/13   Historical Provider, MD  Ticagrelor (BRILINTA) 90 MG TABS tablet Take 1 tablet (90 mg total) by mouth 2 (two) times daily. 06/21/12   Jeffrey Hale Drone, PA-C    Allergies: No Known Allergies  Past Medical History: Past Medical History  Diagnosis Date  . Diabetes mellitus, type II   . Hyperlipidemia     Lipid profile  in 02/2012:135, 227, 41, 49  . Coronary atherosclerosis of native coronary artery     a. CABG x 4 in 1989 (VG->OM1->OM2, VG->RCA, LIMA->LAD), b. 05/2010: DES to VG-OM1/OM2, DES to distal LCx. c. NSTEMI in 04/2011 - TO distal LCX stent and VG->OM2. d. 02/2012 NSTEMI DES to VG-OM1/continuation to OM2 occluded. e. inferior STEMI s/p DES to SVG-RAMUS 06/2012. f. inferolat STEMI 09/2012 s/p DES to SVG-interm; g. Lex MV (11/14):  EF 35%, inf-lat scar with small peri-infarct ischemia  . Essential hypertension, benign   . Osteoarthritis   . History of stroke   . History of pneumonia   . Cervical vertebral fracture   . Chronic back pain   . Benign prostatic hypertrophy     History of urinary retention  . Peptic ulcer disease   . Gastroesophageal reflux disease   . Ischemic cardiomyopathy     LVEF 45-50%; Echo (04/2013):  mild LVH, EF 50%, Gr 1 DD, inf-lat HK, inf-septal and ant-lat HK, Tr MR, mild LAE    Past Surgical History  Procedure Laterality Date  . Tonsillectomy    . Coronary artery bypass graft      Social History: Lives by himself. No smoking, alcohol or illicit drug use. Fairly active at baseline.  Family History:  Family History  Problem Relation Age of Onset  . Early death      Parents died young  . Appendicitis Mother     Pt was 43 year old  . Heart attack Father 70     Review of Systems - History obtained from the patient General ROS: positive for  - fatigue Psychological ROS: negative Ophthalmic ROS: negative ENT ROS: negative Allergy and Immunology ROS: negative Hematological and Lymphatic ROS: negative Endocrine ROS: negative Respiratory ROS: no cough, shortness of breath, or wheezing Cardiovascular ROS: as in hpi Gastrointestinal ROS: as in hpi Genito-Urinary ROS: no dysuria, trouble voiding, or hematuria Musculoskeletal ROS: negative Neurological ROS: no TIA or stroke symptoms Dermatological ROS: negative  Physical Examination  Filed Vitals:   11/05/13 0257   BP: 120/69  Pulse: 83  Temp: 97.5 F (36.4 C)  TempSrc: Oral  Resp: 22  Height: $Remove'5\' 10"'iVKKFNO$  (1.778 m)  Weight: 77.111 kg (170 lb)  SpO2: 99%    BP 120/69  Pulse 83  Temp(Src) 97.5 F (36.4 C) (Oral)  Resp 22  Ht $R'5\' 10"'KD$  (1.778 m)  Wt 77.111 kg (170 lb)  BMI 24.39 kg/m2  SpO2 99%  General appearance: alert, cooperative, appears stated age and no distress Head: Normocephalic, without obvious abnormality, atraumatic Eyes: conjunctivae/corneas clear. PERRL, EOM's intact. Throat: lips, mucosa, and tongue normal; teeth and gums normal Neck: no adenopathy, no carotid bruit, no JVD, supple, symmetrical, trachea midline and thyroid not enlarged, symmetric, no tenderness/mass/nodules Resp: clear to auscultation bilaterally Cardio: regular rate and rhythm, S1, S2 normal, no murmur, click, rub or  gallop GI: soft, non-tender; bowel sounds normal; no masses,  no organomegaly Extremities: extremities normal, atraumatic, no cyanosis or edema Pulses: 2+ and symmetric Skin: Skin color, texture, turgor normal. No rashes or lesions Lymph nodes: Cervical, supraclavicular, and axillary nodes normal. Neurologic: No focal deficits.  Laboratory Data: Results for orders placed during the hospital encounter of 11/05/13 (from the past 48 hour(s))  TROPONIN I     Status: None   Collection Time    11/05/13  3:05 AM      Result Value Ref Range   Troponin I <0.30  <0.30 ng/mL   Comment:            Due to the release kinetics of cTnI,     a negative result within the first hours     of the onset of symptoms does not rule out     myocardial infarction with certainty.     If myocardial infarction is still suspected,     repeat the test at appropriate intervals.  CBC     Status: Abnormal   Collection Time    11/05/13  3:05 AM      Result Value Ref Range   WBC 6.5  4.0 - 10.5 K/uL   RBC 4.11 (*) 4.22 - 5.81 MIL/uL   Hemoglobin 13.2  13.0 - 17.0 g/dL   HCT 38.7 (*) 39.0 - 52.0 %   MCV 94.2  78.0 -  100.0 fL   MCH 32.1  26.0 - 34.0 pg   MCHC 34.1  30.0 - 36.0 g/dL   RDW 13.9  11.5 - 15.5 %   Platelets 184  150 - 400 K/uL  BASIC METABOLIC PANEL     Status: Abnormal   Collection Time    11/05/13  3:05 AM      Result Value Ref Range   Sodium 137  137 - 147 mEq/L   Potassium 4.0  3.7 - 5.3 mEq/L   Chloride 97  96 - 112 mEq/L   CO2 25  19 - 32 mEq/L   Glucose, Bld 180 (*) 70 - 99 mg/dL   BUN 16  6 - 23 mg/dL   Creatinine, Ser 0.97  0.50 - 1.35 mg/dL   Calcium 9.6  8.4 - 10.5 mg/dL   GFR calc non Af Amer 75 (*) >90 mL/min   GFR calc Af Amer 87 (*) >90 mL/min   Comment: (NOTE)     The eGFR has been calculated using the CKD EPI equation.     This calculation has not been validated in all clinical situations.     eGFR's persistently <90 mL/min signify possible Chronic Kidney     Disease.  PROTIME-INR     Status: None   Collection Time    11/05/13  3:05 AM      Result Value Ref Range   Prothrombin Time 12.2  11.6 - 15.2 seconds   INR 0.92  0.00 - 1.49    Radiology Reports: Dg Chest Port 1 View  11/05/2013   CLINICAL DATA:  Chest pain.  EXAM: PORTABLE CHEST - 1 VIEW  COMPARISON:  Chest radiograph performed 08/26/2013  FINDINGS: The lungs are well-aerated. Mild vascular congestion is noted. Minimal left basilar atelectasis is seen. There is no evidence of pleural effusion or pneumothorax.  The cardiomediastinal silhouette is normal in size. The patient is status post median sternotomy. No acute osseous abnormalities are seen.  IMPRESSION: Mild vascular congestion noted. Minimal left basilar atelectasis seen. Lungs otherwise clear.  Electronically Signed   By: Garald Balding M.D.   On: 11/05/2013 04:43    Electrocardiogram: Sinus rhythm at 87 beats per minute. Normal axis. PVCs are seen. ST depression and T wave changes noted in anterolateral leads similar to previous EKG.  Problem List  Principal Problem:   Diarrhea Active Problems:   Diabetes mellitus, type II   Hypertension    Coronary atherosclerosis of native coronary artery   Chest pain   History of Clostridium difficile   Assessment: This is 80 year old, Caucasian male, who presents with diarrhea and developed chest pressure after episodes of diarrhea overnight. He had C. difficile colitis back in February. Concern is about recurrence of the same. Chest pressure was most likely angina. Has resolved after he received nitroglycerin.  Plan: #1 diarrhea concerning for recurrence of C. difficile: Stool has been sent and results are pending. We will empirically start him on Flagyl. Contact precautions will be initiated. Gentle IV hydration to prevent dehydration.  #2 chest pain in the setting of known coronary artery disease: He is a non-ST elevation MI in February, which was thought to be secondary to demand ischemia from dehydration and severe C. difficile colitis. In any case he is still considered a candidate only for medical management. He was initiated on IV heparin in the emergency department. However considering that his symptoms resolved with nitroglycerin with normal Troponin and that he is on Brilinta, Heparin can be stopped for now. Will get serial Troponins. Continue with his other home medications. If his troponins become positive cardiology consultation may be obtained. Review of his outpatient records also suggest that orthostatic hypotension has been an issue recently. He should get up slowly from a sitting and lying down position.  #3 diabetes mellitus, type II: Initiate sliding scale coverage.  #4 history of hypertension: Continue with his home medications.   DVT Prophylaxis: SCD's Code Status: Full code Family Communication: Discussed with the patient  Disposition Plan: Admit to telemetry   Further management decisions will depend on results of further testing and patient's response to treatment.   Bonnielee Haff  [facility] Pager [phone]  If 7PM-7AM, please contact  night-coverage [email or web address] Password [facility]  11/05/2013, 5:29 AM

## 2013-11-05 NOTE — ED Notes (Signed)
Reports diarrhea began last night at bedtime.  Chest pressure began few minutes ago, just prior to coming to ER.

## 2013-11-05 NOTE — ED Notes (Signed)
CRITICAL VALUE ALERT  Critical value received:  Cdiff +  Date of notification:  11/05/2013  Time of notification:  0532  Critical value read back:yes  Nurse who received alert:  Toma Deiters  MD notified (1st page):  Dr. Sabra Heck  Time of first page:  0532  MD notified (2nd page):  Time of second page:  Responding MD:  Dr. Sabra Heck  Time MD responded:  [phone]

## 2013-11-06 DIAGNOSIS — A499 Bacterial infection, unspecified: Secondary | ICD-10-CM

## 2013-11-06 DIAGNOSIS — I10 Essential (primary) hypertension: Secondary | ICD-10-CM

## 2013-11-06 DIAGNOSIS — R072 Precordial pain: Secondary | ICD-10-CM

## 2013-11-06 DIAGNOSIS — B9689 Other specified bacterial agents as the cause of diseases classified elsewhere: Secondary | ICD-10-CM

## 2013-11-06 DIAGNOSIS — E785 Hyperlipidemia, unspecified: Secondary | ICD-10-CM

## 2013-11-06 LAB — COMPREHENSIVE METABOLIC PANEL
ALT: 20 U/L (ref 0–53)
AST: 18 U/L (ref 0–37)
Albumin: 3.2 g/dL — ABNORMAL LOW (ref 3.5–5.2)
Alkaline Phosphatase: 98 U/L (ref 39–117)
BUN: 12 mg/dL (ref 6–23)
CO2: 25 mEq/L (ref 19–32)
Calcium: 8.9 mg/dL (ref 8.4–10.5)
Chloride: 100 mEq/L (ref 96–112)
Creatinine, Ser: 0.84 mg/dL (ref 0.50–1.35)
GFR calc non Af Amer: 79 mL/min — ABNORMAL LOW (ref >90)
Glucose, Bld: 221 mg/dL — ABNORMAL HIGH (ref 70–99)
POTASSIUM: 4.1 meq/L (ref 3.7–5.3)
Sodium: 136 mEq/L — ABNORMAL LOW (ref 137–147)
TOTAL PROTEIN: 6 g/dL (ref 6.0–8.3)
Total Bilirubin: 0.3 mg/dL (ref 0.3–1.2)

## 2013-11-06 LAB — GLUCOSE, CAPILLARY: GLUCOSE-CAPILLARY: 179 mg/dL — AB (ref 70–99)

## 2013-11-06 LAB — CBC
HEMATOCRIT: 36.3 % — AB (ref 39.0–52.0)
Hemoglobin: 12.1 g/dL — ABNORMAL LOW (ref 13.0–17.0)
MCH: 31.8 pg (ref 26.0–34.0)
MCHC: 33.3 g/dL (ref 30.0–36.0)
MCV: 95.3 fL (ref 78.0–100.0)
Platelets: 169 10*3/uL (ref 150–400)
RBC: 3.81 MIL/uL — AB (ref 4.22–5.81)
RDW: 13.9 % (ref 11.5–15.5)
WBC: 6.3 10*3/uL (ref 4.0–10.5)

## 2013-11-06 MED ORDER — HEPARIN SODIUM (PORCINE) 5000 UNIT/ML IJ SOLN: 5000.0000 [IU] | Freq: Three times a day (TID) | INTRAMUSCULAR | Status: DC

## 2013-11-06 MED ORDER — METRONIDAZOLE 500 MG PO TABS: 500.0000 mg | ORAL_TABLET | Freq: Three times a day (TID) | ORAL | Status: DC

## 2013-11-06 MED ORDER — SODIUM CHLORIDE 0.9 % IV SOLN: INTRAVENOUS | Status: DC

## 2013-11-06 NOTE — Progress Notes (Signed)
Patient discharged to home taken [facility] via wheelchair.

## 2013-11-06 NOTE — Discharge Summary (Signed)
Physician Discharge Summary  Jeffrey [name] CWC:[id number] DOB: 06-15-1929 DOA: 11/05/2013  PCP: [name]., MD  Admit date: 11/05/2013 Discharge date: 11/06/2013  Time spent: 35 minutes  Recommendations for Outpatient Follow-up:  Follow up with PCP in 1-2 weeks  Discharge Diagnoses:  Principal Problem:   Diarrhea Active Problems:   Diabetes mellitus, type II   Hypertension   Coronary atherosclerosis of native coronary artery   Chest pain   C. difficile diarrhea   History of Clostridium difficile   Discharge Condition: Stable  Diet recommendation: Diabetic  Filed Weights   11/05/13 0257 11/05/13 0612 11/06/13 0500  Weight: 77.111 kg (170 lb) 77.2 kg (170 lb 3.1 oz) 80.9 kg (178 lb 5.6 oz)    History of present illness:  Jeffrey [name] is a 80 y.o. male with a past medical history of coronary artery disease, status post CABG in the past, diabetes, hypertension, recent hospitalization for C. difficile Colitis, and for non-ST elevation MI, thought to be secondary to demand ischemia. Patient had lost a lot of weight and had become deconditioned during that hospitalization and had to go to a skilled nursing facility for rehabilitation. He came back home from the skilled nursing facility about 2 weeks ago, and was doing well for the most part. He started having diarrhea at 8 PM last night. He must have had about 10 episodes of loose, green-colored stool. Denies any blood in the stool. No abdominal pain. No nausea, vomiting, no fever. No chills. And while he was getting ready to come to the hospital he started having tightness in his chest associated with some shortness of breath. Denies any dizziness or lightheadedness. It was located in the center part of his chest and towards the left side. He was given aspirin and nitroglycerin in the ED, and his chest pain has resolved.  Hospital Course:  1. Chest pain/CAD  1. Pt with known hx of ischemic heart disease with prior recs for  medical management only 2. Cardiac enzymes have been serially neg 3. Pt is cont on PRN ntg, Brilinta, heparin gtt initially started, but was later d/c'd 4. Pt has remained asymptomatic since admission 2. Cdiff colitis  1. Stool pos for cdiff 2. On flagyl 3. No leukocytosis 4. Tolerating regular PO 5. Diarrhea resolved by day of discharge 6. Will continue total 10 days of flagyl post-d/c 3. DM  1. On ssi coverage 4. HTN  1. Stable 2. Will resume home coreg 5. DVT prophylaxis  1. Heparin subQ while inpt  Consultations:  none  Discharge Exam: Filed Vitals:   11/06/13 0500 11/06/13 0600 11/06/13 0730 11/06/13 0800  BP:  130/60  138/66  Pulse:    68  Temp:   98.1 F (36.7 C)   TempSrc:   Oral   Resp: 16 10  18   Height:      Weight: 80.9 kg (178 lb 5.6 oz)     SpO2:    96%    General: awake, in nad Cardiovascular: regular, s1, s2 Respiratory: normal resp effort, no wheezing  Discharge Instructions     Medication List    STOP taking these medications       diphenoxylate-atropine 2.5-0.025 MG per tablet  Commonly known as:  LOMOTIL      TAKE these medications       albuterol 108 (90 BASE) MCG/ACT inhaler  Commonly known as:  PROVENTIL HFA;VENTOLIN HFA  Inhale 2 puffs into the lungs every 6 (six) hours as needed for wheezing or  shortness of breath.     aspirin EC 81 MG tablet  Take 81 mg by mouth daily.     atorvastatin 40 MG tablet  Commonly known as:  LIPITOR  Take 1 tablet (40 mg total) by mouth daily at 6 PM.     carvedilol 3.125 MG tablet  Commonly known as:  COREG  Take 1 tablet (3.125 mg total) by mouth 2 (two) times daily with a meal.     famotidine 20 MG tablet  Commonly known as:  PEPCID  Take 1 tablet (20 mg total) by mouth 2 (two) times daily.     ferrous sulfate 325 (65 FE) MG tablet  Take 325 mg by mouth daily with breakfast.     furosemide 20 MG tablet  Commonly known as:  LASIX  Take 20 mg by mouth daily as needed for fluid. Take 1  tablet as needed if 3 lb weight gain.     lisinopril 2.5 MG tablet  Commonly known as:  PRINIVIL,ZESTRIL  Take 1 tablet (2.5 mg total) by mouth daily.     metFORMIN 500 MG tablet  Commonly known as:  GLUCOPHAGE  Take 500 mg by mouth 2 (two) times daily with a meal.     metroNIDAZOLE 500 MG tablet  Commonly known as:  FLAGYL  Take 1 tablet (500 mg total) by mouth every 8 (eight) hours.     multivitamin with minerals Tabs tablet  Take 1 tablet by mouth daily.     nitroGLYCERIN 0.4 MG SL tablet  Commonly known as:  NITROSTAT  Place 1 tablet (0.4 mg total) under the tongue every 5 (five) minutes x 3 doses as needed for chest pain.     potassium chloride SA 20 MEQ tablet  Commonly known as:  K-DUR,KLOR-CON  Take 20 mEq by mouth daily as needed (Takes only with Lasix).     ranolazine 500 MG 12 hr tablet  Commonly known as:  RANEXA  Take 1 tablet (500 mg total) by mouth 2 (two) times daily.     tamsulosin 0.4 MG Caps capsule  Commonly known as:  FLOMAX  Take 0.4 mg by mouth daily after supper.     ticagrelor 90 MG Tabs tablet  Commonly known as:  BRILINTA  Take 1 tablet (90 mg total) by mouth 2 (two) times daily.       No Known Allergies Follow-up Information   Follow up with [name]., MD. Schedule an appointment as soon as possible for a visit in 1 week.   Specialty:  Internal Medicine   Contact information:   [address] [place] [address] [phone]        The results of significant diagnostics from this hospitalization (including imaging, microbiology, ancillary and laboratory) are listed below for reference.    Significant Diagnostic Studies: Dg Chest Port 1 View  11/05/2013   CLINICAL DATA:  Chest pain.  EXAM: PORTABLE CHEST - 1 VIEW  COMPARISON:  Chest radiograph performed 08/26/2013  FINDINGS: The lungs are well-aerated. Mild vascular congestion is noted. Minimal left basilar atelectasis is seen. There is no evidence of  pleural effusion or pneumothorax.  The cardiomediastinal silhouette is normal in size. The patient is status post median sternotomy. No acute osseous abnormalities are seen.  IMPRESSION: Mild vascular congestion noted. Minimal left basilar atelectasis seen. Lungs otherwise clear.   Electronically Signed   By: Garald Balding M.D.   On: 11/05/2013 04:43    Microbiology: Recent Results (from the  past 240 hour(s))  CLOSTRIDIUM DIFFICILE BY PCR     Status: Abnormal   Collection Time    11/05/13  4:17 AM      Result Value Ref Range Status   C difficile by pcr POSITIVE (*) NEGATIVE Final   Comment: CRITICAL RESULT CALLED TO, READ BACK BY AND VERIFIED WITH:     BELTON C. AT 0534A ON [id number] BY THOMPSON S.  MRSA PCR SCREENING     Status: None   Collection Time    11/05/13  6:20 AM      Result Value Ref Range Status   MRSA by PCR NEGATIVE  NEGATIVE Final   Comment:            The GeneXpert MRSA Assay (FDA     approved for NASAL specimens     only), is one component of a     comprehensive MRSA colonization     surveillance program. It is not     intended to diagnose MRSA     infection nor to guide or     monitor treatment for     MRSA infections.     Labs: Basic Metabolic Panel:  Recent Labs Lab 11/05/13 0305 11/06/13 0438  NA 137 136*  K 4.0 4.1  CL 97 100  CO2 25 25  GLUCOSE 180* 221*  BUN 16 12  CREATININE 0.97 0.84  CALCIUM 9.6 8.9   Liver Function Tests:  Recent Labs Lab 11/06/13 0438  AST 18  ALT 20  ALKPHOS 98  BILITOT 0.3  PROT 6.0  ALBUMIN 3.2*   No results found for this basename: LIPASE, AMYLASE,  in the last 168 hours No results found for this basename: AMMONIA,  in the last 168 hours CBC:  Recent Labs Lab 11/05/13 0305 11/06/13 0438  WBC 6.5 6.3  HGB 13.2 12.1*  HCT 38.7* 36.3*  MCV 94.2 95.3  PLT 184 169   Cardiac Enzymes:  Recent Labs Lab 11/05/13 0305 11/05/13 0719 11/05/13 1311 11/05/13 1856  TROPONINI <0.30 <0.30 <0.30 <0.30    BNP: BNP (last 3 results)  Recent Labs  04/27/13 1140 08/17/13 2032  PROBNP 1023.00* 1587.0*   CBG:  Recent Labs Lab 11/05/13 0741 11/05/13 1200 11/05/13 1649 11/05/13 2144 11/06/13 0722  GLUCAP 173* 200* 203* 229* 179*     Signed:  Donne Hazel  [facility] 11/06/2013, 9:20 AM

## 2013-11-24 NOTE — Care Management Utilization Note (Signed)
UR completed 

## 2013-12-04 ENCOUNTER — Encounter (HOSPITAL_COMMUNITY): Payer: Self-pay | Admitting: Emergency Medicine

## 2013-12-04 ENCOUNTER — Emergency Department (HOSPITAL_COMMUNITY)
Admission: EM | Admit: 2013-12-04 | Discharge: 2013-12-04 | Disposition: A | Discharge status: Home / Self Care | Payer: Medicare Other | Attending: Emergency Medicine | Admitting: Emergency Medicine

## 2013-12-04 DIAGNOSIS — E119 Type 2 diabetes mellitus without complications: Secondary | ICD-10-CM | POA: Insufficient documentation

## 2013-12-04 DIAGNOSIS — Z87891 Personal history of nicotine dependence: Secondary | ICD-10-CM | POA: Insufficient documentation

## 2013-12-04 DIAGNOSIS — Z8673 Personal history of transient ischemic attack (TIA), and cerebral infarction without residual deficits: Secondary | ICD-10-CM | POA: Insufficient documentation

## 2013-12-04 DIAGNOSIS — Z7982 Long term (current) use of aspirin: Secondary | ICD-10-CM | POA: Insufficient documentation

## 2013-12-04 DIAGNOSIS — I10 Essential (primary) hypertension: Secondary | ICD-10-CM

## 2013-12-04 DIAGNOSIS — G8929 Other chronic pain: Secondary | ICD-10-CM | POA: Insufficient documentation

## 2013-12-04 DIAGNOSIS — K219 Gastro-esophageal reflux disease without esophagitis: Secondary | ICD-10-CM | POA: Insufficient documentation

## 2013-12-04 DIAGNOSIS — N400 Enlarged prostate without lower urinary tract symptoms: Secondary | ICD-10-CM | POA: Insufficient documentation

## 2013-12-04 DIAGNOSIS — Z951 Presence of aortocoronary bypass graft: Secondary | ICD-10-CM | POA: Insufficient documentation

## 2013-12-04 DIAGNOSIS — E785 Hyperlipidemia, unspecified: Secondary | ICD-10-CM | POA: Insufficient documentation

## 2013-12-04 DIAGNOSIS — Z8781 Personal history of (healed) traumatic fracture: Secondary | ICD-10-CM | POA: Insufficient documentation

## 2013-12-04 DIAGNOSIS — I2510 Atherosclerotic heart disease of native coronary artery without angina pectoris: Secondary | ICD-10-CM | POA: Insufficient documentation

## 2013-12-04 DIAGNOSIS — Z8701 Personal history of pneumonia (recurrent): Secondary | ICD-10-CM | POA: Insufficient documentation

## 2013-12-04 DIAGNOSIS — Z79899 Other long term (current) drug therapy: Secondary | ICD-10-CM | POA: Insufficient documentation

## 2013-12-04 NOTE — ED Notes (Signed)
Pt states his blood pressure has been running high, stating it 155/85 today and 145/80 yesterday. Pt states he is concerned because he has had a stroke in the past.

## 2013-12-04 NOTE — Discharge Instructions (Signed)
Start taking your blood pressure medication, lisinopril 2.5mg  every day. Keep a record of your blood pressure every day and take it with you next time you visit your doctor.

## 2013-12-04 NOTE — ED Provider Notes (Addendum)
CSN: [id number]     Arrival date & time 12/04/13  1315 History   First MD Initiated Contact with Patient 12/04/13 1355     Chief Complaint  Patient presents with  . Hypertension     (Consider location/radiation/quality/duration/timing/severity/associated sxs/prior Treatment) HPI This is an 80 year old male with a history of hypertension. He has not been taking his "blood pressure medication" (lisinopril) for about 4 weeks, although he states he has been taking his "heart medications" (including Coreg) twice daily. He states his blood pressures have been well-controlled. He takes his blood pressure every morning both sitting and standing. Yesterday he noted his blood pressure to be 145/80 and this morning it was 155/85. He rechecked it and it was 159/89. He is concerned that he may have a stroke due to his blood pressure being elevated. This morning he took a 2.5 mg lisinopril tablet for the first time, as noted, in weeks. He denies headache, visual changes, chest pain, shortness of breath, nausea or vomiting. He does have some transient lightheadedness when he stands rapidly. He says he has some slight residual weakness in his left lower leg due to a previous stroke.  His most recent blood pressure in the ED is 139/83.  Past Medical History  Diagnosis Date  . Diabetes mellitus, type II   . Hyperlipidemia     Lipid profile in 02/2012:135, 227, 41, 49  . Coronary atherosclerosis of native coronary artery     a. CABG x 4 in 1989 (VG->OM1->OM2, VG->RCA, LIMA->LAD), b. 05/2010: DES to VG-OM1/OM2, DES to distal LCx. c. NSTEMI in 04/2011 - TO distal LCX stent and VG->OM2. d. 02/2012 NSTEMI DES to VG-OM1/continuation to OM2 occluded. e. inferior STEMI s/p DES to SVG-RAMUS 06/2012. f. inferolat STEMI 09/2012 s/p DES to SVG-interm; g. Lex MV (11/14):  EF 35%, inf-lat scar with small peri-infarct ischemia  . Essential hypertension, benign   . Osteoarthritis   . History of stroke   . History of pneumonia    . Cervical vertebral fracture   . Chronic back pain   . Benign prostatic hypertrophy     History of urinary retention  . Peptic ulcer disease   . Gastroesophageal reflux disease   . Ischemic cardiomyopathy     LVEF 45-50%; Echo (04/2013):  mild LVH, EF 50%, Gr 1 DD, inf-lat HK, inf-septal and ant-lat HK, Tr MR, mild LAE   Past Surgical History  Procedure Laterality Date  . Tonsillectomy    . Coronary artery bypass graft     Family History  Problem Relation Age of Onset  . Early death      Parents died young  . Appendicitis Mother     Pt was 62 year old  . Heart attack Father 39   History  Substance Use Topics  . Smoking status: Former Smoker -- 2.00 packs/day for 10 years    Types: Cigarettes    Quit date: 07/14/1961  . Smokeless tobacco: Current User    Types: Chew  . Alcohol Use: No    Review of Systems  All other systems reviewed and are negative.   Allergies  Review of patient's allergies indicates no known allergies.  Home Medications   Prior to Admission medications   Medication Sig Start Date End Date Taking? Authorizing Provider  albuterol (PROVENTIL HFA;VENTOLIN HFA) 108 (90 BASE) MCG/ACT inhaler Inhale 2 puffs into the lungs every 6 (six) hours as needed for wheezing or shortness of breath.    Historical Provider, MD  aspirin EC 81  MG tablet Take 81 mg by mouth daily.    Historical Provider, MD  atorvastatin (LIPITOR) 40 MG tablet Take 1 tablet (40 mg total) by mouth daily at 6 PM. 05/29/13   Liliane Shi, PA-C  carvedilol (COREG) 3.125 MG tablet Take 1 tablet (3.125 mg total) by mouth 2 (two) times daily with a meal. 05/22/13   Brooke O Edmisten, PA-C  famotidine (PEPCID) 20 MG tablet Take 1 tablet (20 mg total) by mouth 2 (two) times daily. 09/01/13   Samuella Cota, MD  ferrous sulfate 325 (65 FE) MG tablet Take 325 mg by mouth daily with breakfast.    Historical Provider, MD  furosemide (LASIX) 20 MG tablet Take 20 mg by mouth daily as needed for  fluid. Take 1 tablet as needed if 3 lb weight gain. 05/06/13   Lendon Colonel, NP  lisinopril (PRINIVIL,ZESTRIL) 2.5 MG tablet Take 1 tablet (2.5 mg total) by mouth daily. 09/23/13   Lendon Colonel, NP  metFORMIN (GLUCOPHAGE) 500 MG tablet Take 500 mg by mouth 2 (two) times daily with a meal.    Historical Provider, MD  metroNIDAZOLE (FLAGYL) 500 MG tablet Take 1 tablet (500 mg total) by mouth every 8 (eight) hours. 11/06/13   Donne Hazel, MD  Multiple Vitamin (MULTIVITAMIN WITH MINERALS) TABS Take 1 tablet by mouth daily.    Historical Provider, MD  nitroGLYCERIN (NITROSTAT) 0.4 MG SL tablet Place 1 tablet (0.4 mg total) under the tongue every 5 (five) minutes x 3 doses as needed for chest pain. 11/11/12 11/11/13  Rhonda G Barrett, PA-C  potassium chloride SA (K-DUR,KLOR-CON) 20 MEQ tablet Take 20 mEq by mouth daily as needed (Takes only with Lasix). 04/28/13   Lendon Colonel, NP  ranolazine (RANEXA) 500 MG 12 hr tablet Take 1 tablet (500 mg total) by mouth 2 (two) times daily. 11/01/13   Herminio Commons, MD  tamsulosin (FLOMAX) 0.4 MG CAPS Take 0.4 mg by mouth daily after supper.  01/27/13   Historical Provider, MD  Ticagrelor (BRILINTA) 90 MG TABS tablet Take 1 tablet (90 mg total) by mouth 2 (two) times daily. 06/21/12   Roger A Arguello, PA-C   BP 161/99  Pulse 85  Temp(Src) 98 F (36.7 C) (Oral)  Resp 16  SpO2 98%  Physical Exam General: Well-developed, well-nourished male in no acute distress; appearance consistent with age of record HENT: normocephalic; atraumatic; edentulous Eyes: pupils equal, round and reactive to light; extraocular muscles intact Neck: supple Heart: regular rate and rhythm Lungs: clear to auscultation bilaterally Abdomen: soft; nondistended; nontender; bowel sounds present Extremities: No deformity; full range of motion; pulses normal; no edema Neurologic: Awake, alert and oriented; motor function intact in all extremities and symmetric; no facial  droop Skin: Warm and dry Psychiatric: Normal mood and affect    ED Course  Procedures (including critical care time)   MDM  The patient was reassured that his recently elevated blood pressures are not putting him at significant risk of acute stroke in the short run. He was advised to restart his lisinopril and to followup with his primary care physician. He was advised that over treating asymptomatic blood pressure can itself precipitate a stroke.    Wynetta Fines, MD 12/04/13 [place], MD 12/04/13 [phone]

## 2013-12-16 ENCOUNTER — Telehealth: Payer: Self-pay | Admitting: Cardiovascular Disease

## 2013-12-16 MED ORDER — TICAGRELOR 90 MG PO TABS: 90.0000 mg | ORAL_TABLET | Freq: Two times a day (BID) | ORAL | Status: DC

## 2013-12-16 NOTE — Telephone Encounter (Signed)
Received fax refill request  Rx # [id number] Medication:  Brilinta 90 mg tablets Qty 60 Sig:  Take one tablet by mouth twice daily Physician:  Bronson Ing

## 2013-12-16 NOTE — Telephone Encounter (Signed)
Medication sent to pharmacy  

## 2013-12-21 ENCOUNTER — Ambulatory Visit: Payer: Medicare Other | Admitting: Cardiovascular Disease

## 2013-12-29 ENCOUNTER — Telehealth: Payer: Self-pay | Admitting: *Deleted

## 2013-12-29 NOTE — Telephone Encounter (Signed)
Pt called to see if we could get his lisinopril refilled and sent to his home. Also wanted refills for pantoprazole and inhaler. Told pt that pcp needs to refill those meds. Told pt I would call pharmacy to refill lisinopril and send other refills to Dr. Gerarda Fraction. Spoke to [facility] refilled lisinopril and had it sent to his home per pt stating he did not want to get in the heat outside. Pharmacist said she would send refills for inhaler and pantoprazole to [facility]. Patient was happy.

## 2013-12-29 NOTE — Telephone Encounter (Signed)
pantoprazole 40 mg and lisinopril 2.5mg  need called in to [facility]. Pt states pharmacy has been sending request with no response/tmj

## 2014-01-05 ENCOUNTER — Telehealth: Payer: Self-pay | Admitting: *Deleted

## 2014-01-05 MED ORDER — CARVEDILOL 3.125 MG PO TABS: 3.1250 mg | ORAL_TABLET | Freq: Two times a day (BID) | ORAL | Status: DC

## 2014-01-05 NOTE — Telephone Encounter (Signed)
Medication sent via escribe.  

## 2014-01-05 NOTE — Telephone Encounter (Signed)
Carvedilol needs called in to [facility]

## 2014-01-11 ENCOUNTER — Encounter (HOSPITAL_COMMUNITY): Payer: Self-pay | Admitting: Emergency Medicine

## 2014-01-11 ENCOUNTER — Emergency Department (HOSPITAL_COMMUNITY)
Admission: EM | Admit: 2014-01-11 | Discharge: 2014-01-11 | Disposition: A | Discharge status: Home / Self Care | Payer: Medicare Other | Attending: Emergency Medicine | Admitting: Emergency Medicine

## 2014-01-11 DIAGNOSIS — M1990 Unspecified osteoarthritis, unspecified site: Secondary | ICD-10-CM | POA: Insufficient documentation

## 2014-01-11 DIAGNOSIS — R5381 Other malaise: Secondary | ICD-10-CM | POA: Diagnosis present

## 2014-01-11 DIAGNOSIS — Z8781 Personal history of (healed) traumatic fracture: Secondary | ICD-10-CM | POA: Diagnosis not present

## 2014-01-11 DIAGNOSIS — Z87891 Personal history of nicotine dependence: Secondary | ICD-10-CM | POA: Diagnosis not present

## 2014-01-11 DIAGNOSIS — E860 Dehydration: Secondary | ICD-10-CM | POA: Diagnosis not present

## 2014-01-11 DIAGNOSIS — Z8701 Personal history of pneumonia (recurrent): Secondary | ICD-10-CM | POA: Diagnosis not present

## 2014-01-11 DIAGNOSIS — Z87448 Personal history of other diseases of urinary system: Secondary | ICD-10-CM | POA: Diagnosis not present

## 2014-01-11 DIAGNOSIS — R197 Diarrhea, unspecified: Secondary | ICD-10-CM | POA: Insufficient documentation

## 2014-01-11 DIAGNOSIS — I10 Essential (primary) hypertension: Secondary | ICD-10-CM | POA: Insufficient documentation

## 2014-01-11 DIAGNOSIS — Z79899 Other long term (current) drug therapy: Secondary | ICD-10-CM | POA: Diagnosis not present

## 2014-01-11 DIAGNOSIS — Z951 Presence of aortocoronary bypass graft: Secondary | ICD-10-CM | POA: Insufficient documentation

## 2014-01-11 DIAGNOSIS — Z8711 Personal history of peptic ulcer disease: Secondary | ICD-10-CM | POA: Diagnosis not present

## 2014-01-11 DIAGNOSIS — I2510 Atherosclerotic heart disease of native coronary artery without angina pectoris: Secondary | ICD-10-CM | POA: Diagnosis not present

## 2014-01-11 DIAGNOSIS — R5383 Other fatigue: Secondary | ICD-10-CM | POA: Diagnosis present

## 2014-01-11 DIAGNOSIS — E119 Type 2 diabetes mellitus without complications: Secondary | ICD-10-CM | POA: Diagnosis not present

## 2014-01-11 DIAGNOSIS — I951 Orthostatic hypotension: Secondary | ICD-10-CM

## 2014-01-11 DIAGNOSIS — E785 Hyperlipidemia, unspecified: Secondary | ICD-10-CM | POA: Insufficient documentation

## 2014-01-11 DIAGNOSIS — Z8673 Personal history of transient ischemic attack (TIA), and cerebral infarction without residual deficits: Secondary | ICD-10-CM | POA: Diagnosis not present

## 2014-01-11 DIAGNOSIS — Z7982 Long term (current) use of aspirin: Secondary | ICD-10-CM | POA: Insufficient documentation

## 2014-01-11 LAB — COMPREHENSIVE METABOLIC PANEL
ALBUMIN: 3.7 g/dL (ref 3.5–5.2)
ALK PHOS: 96 U/L (ref 39–117)
ALT: 18 U/L (ref 0–53)
AST: 18 U/L (ref 0–37)
Anion gap: 13 (ref 5–15)
BILIRUBIN TOTAL: 0.2 mg/dL — AB (ref 0.3–1.2)
BUN: 15 mg/dL (ref 6–23)
CHLORIDE: 98 meq/L (ref 96–112)
CO2: 26 mEq/L (ref 19–32)
Calcium: 9.5 mg/dL (ref 8.4–10.5)
Creatinine, Ser: 1.1 mg/dL (ref 0.50–1.35)
GFR calc Af Amer: 70 mL/min — ABNORMAL LOW (ref >90)
GFR calc non Af Amer: 61 mL/min — ABNORMAL LOW (ref >90)
Glucose, Bld: 202 mg/dL — ABNORMAL HIGH (ref 70–99)
POTASSIUM: 4.7 meq/L (ref 3.7–5.3)
SODIUM: 137 meq/L (ref 137–147)
TOTAL PROTEIN: 6.9 g/dL (ref 6.0–8.3)

## 2014-01-11 LAB — URINALYSIS, ROUTINE W REFLEX MICROSCOPIC
Bilirubin Urine: NEGATIVE
Glucose, UA: NEGATIVE mg/dL
HGB URINE DIPSTICK: NEGATIVE
Ketones, ur: NEGATIVE mg/dL
LEUKOCYTES UA: NEGATIVE
Nitrite: NEGATIVE
SPECIFIC GRAVITY, URINE: 1.01 (ref 1.005–1.030)
UROBILINOGEN UA: 0.2 mg/dL (ref 0.0–1.0)
pH: 7.5 (ref 5.0–8.0)

## 2014-01-11 LAB — CBC WITH DIFFERENTIAL/PLATELET
Basophils Absolute: 0 10*3/uL (ref 0.0–0.1)
Basophils Relative: 0 % (ref 0–1)
EOS ABS: 0.2 10*3/uL (ref 0.0–0.7)
Eosinophils Relative: 3 % (ref 0–5)
HCT: 39.3 % (ref 39.0–52.0)
HEMOGLOBIN: 13.4 g/dL (ref 13.0–17.0)
Lymphocytes Relative: 20 % (ref 12–46)
Lymphs Abs: 1.3 10*3/uL (ref 0.7–4.0)
MCH: 32 pg (ref 26.0–34.0)
MCHC: 34.1 g/dL (ref 30.0–36.0)
MCV: 93.8 fL (ref 78.0–100.0)
MONOS PCT: 8 % (ref 3–12)
Monocytes Absolute: 0.5 10*3/uL (ref 0.1–1.0)
NEUTROS ABS: 4.3 10*3/uL (ref 1.7–7.7)
NEUTROS PCT: 69 % (ref 43–77)
PLATELETS: 148 10*3/uL — AB (ref 150–400)
RBC: 4.19 MIL/uL — AB (ref 4.22–5.81)
RDW: 12.8 % (ref 11.5–15.5)
WBC: 6.3 10*3/uL (ref 4.0–10.5)

## 2014-01-11 LAB — TROPONIN I: Troponin I: <0.3 ng/mL (ref <0.30)

## 2014-01-11 LAB — URINE MICROSCOPIC-ADD ON

## 2014-01-11 LAB — CK: Total CK: 256 U/L — ABNORMAL HIGH (ref 7–232)

## 2014-01-11 MED ORDER — SODIUM CHLORIDE 0.9 % IV BOLUS (SEPSIS)
700.0000 mL | Freq: Once | INTRAVENOUS | Status: AC
  Administered 2014-01-11: 700 mL via INTRAVENOUS

## 2014-01-11 NOTE — Discharge Instructions (Signed)
Try to drink a lot of fluids, especially while it is hot. Recheck as needed.    Dehydration Dehydration is when you lose more fluids from the body than you take in. Vital organs such as the kidneys, brain, and heart cannot function without a proper amount of fluids and salt. Any loss of fluids from the body can cause dehydration.  Older adults are at a higher risk of dehydration than younger adults. As we age, our bodies are less able to conserve water and do not respond to temperature changes as well. Also, older adults do not become thirsty as easily or quickly. Because of this, older adults often do not realize they need to increase fluids to avoid dehydration.  CAUSES   Vomiting.  Diarrhea.  Excessive sweating.  Excessive urination.  Fever.  Certain medicines, such as blood pressure medicines called diuretics.  Poorly controlled blood sugars. SIGNS AND SYMPTOMS  Mild dehydration:  Thirst.  Dry lips.  Slightly dry mouth. Moderate dehydration:  Very dry mouth.  Sunken eyes.  Skin does not bounce back quickly when lightly pinched and released.  Dark urine and decreased urine production.  Decreased tear production.  Headache. Severe dehydration:  Very dry mouth.  Extreme thirst.  Rapid, weak pulse (more than 100 beats per minute at rest).  Cold hands and feet.  Not able to sweat in spite of heat.  Rapid breathing.  Blue lips.  Confusion and lethargy.  Difficulty being awakened.  Minimal urine production.  No tears. DIAGNOSIS  Your health care provider will diagnose dehydration based on your symptoms and your exam. Blood and urine tests will help confirm the diagnosis. The diagnostic evaluation should also identify the cause of dehydration. TREATMENT  Treatment of mild or moderate dehydration can often be done at home by increasing the amount of fluids that you drink. It is best to drink small amounts of fluid more often. Drinking too much at one  time can make vomiting worse. Severe dehydration needs to be treated at the hospital. You may be given IV fluids that contain water and electrolytes. HOME CARE INSTRUCTIONS   Ask your health care provider about specific rehydration instructions.  Drink enough fluids to keep your urine clear or pale yellow.  Drink small amounts frequently if you have nausea and vomiting.  Eat as you normally do.  Avoid:  Foods or drinks high in sugar.  Carbonated drinks.  Juice.  Extremely hot or cold fluids.  Drinks with caffeine.  Fatty, greasy foods.  Alcohol.  Tobacco.  Overeating.  Gelatin desserts.  Wash your hands well to avoid spreading bacteria and viruses.  Only take over-the-counter or prescription medicines for pain, discomfort, or fever as directed by your health care provider.  Ask your health care provider if you should continue all prescribed and over-the-counter medicines.  Keep all follow-up appointments with your health care provider. SEEK MEDICAL CARE IF:  You have abdominal pain, and it increases or stays in one area (localizes).  You have a rash, stiff neck, or severe headache.  You are irritable, sleepy, or difficult to awaken.  You are weak, dizzy, or extremely thirsty.  You have a fever. SEEK IMMEDIATE MEDICAL CARE IF:   You are unable to keep fluids down, or you get worse despite treatment.  You have frequent episodes of vomiting or diarrhea.  You have blood or green matter (bile) in your vomit.  You have blood in your stool, or your stool looks black and tarry.  You have  not urinated in 6-8 hours, or you have only urinated a small amount of very dark urine.  You faint. MAKE SURE YOU:   Understand these instructions.  Will watch your condition.  Will get help right away if you are not doing well or get worse. Document Released: 09/20/2003 Document Revised: 07/05/2013 Document Reviewed: 03/07/2013 [facility] Patient Information 2015  [place], [place]. This information is not intended to replace advice given to you by your health care provider. Make sure you discuss any questions you have with your health care provider.

## 2014-01-11 NOTE — ED Notes (Signed)
MD at bedside. 

## 2014-01-11 NOTE — ED Notes (Signed)
Pt reports low bp, generalized weakness, and dizziness with standing x1 day. Pt alert and oriented. Pt denies cp,sob, n/v/d.

## 2014-01-11 NOTE — ED Provider Notes (Signed)
CSN: [id number]     Arrival date & time 01/11/14  9562 History  This chart was scribed for Jeffrey Norrie, MD by [name], ED Scribe. This patient was seen in room APA05/APA05 and the patient's care was started at 8:58 AM.   Chief Complaint  Patient presents with  . Weakness   The history is provided by the patient. No language interpreter was used.   HPI Comments: Jeffrey [name] is a 80 y.o. male with history of HTN who presents to the Emergency Department with concerns of weakness. Pt states he thinks he feels bad because of the heat. However, he states he has air conditioning in his house and he doesn't do anything outside in the heat. When asked how long he has felt bad he states he doesn't know, states he hasn't felt well for years.  Pt reports he checks his blood pressure regularly and he has had lower than usual blood pressure for the past two days. He states that his BP was 95/40 this morning. He also states that his BP drops 20 points when he stands the past couple of days whereas in the past it would go up a few points. He reports having mild generalized weakness for the past few days, but states he has been weak and not feeling well "for years". He states that he had 3 episodes of diarrhea last night, but that he deals with this from time to time. He states he took a diarrhea pill last night. He denies any recent medication changes. He denies any nausea, emesis, cough, chest pain, SOB, abdominal pain, lightheadedness, dizziness or any other symptoms.   PCP Dr Gerarda Fraction  Past Medical History  Diagnosis Date  . Diabetes mellitus, type II   . Hyperlipidemia     Lipid profile in 02/2012:135, 227, 41, 49  . Coronary atherosclerosis of native coronary artery     a. CABG x 4 in 1989 (VG->OM1->OM2, VG->RCA, LIMA->LAD), b. 05/2010: DES to VG-OM1/OM2, DES to distal LCx. c. NSTEMI in 04/2011 - TO distal LCX stent and VG->OM2. d. 02/2012 NSTEMI DES to VG-OM1/continuation to OM2 occluded. e. inferior  STEMI s/p DES to SVG-RAMUS 06/2012. f. inferolat STEMI 09/2012 s/p DES to SVG-interm; g. Lex MV (11/14):  EF 35%, inf-lat scar with small peri-infarct ischemia  . Essential hypertension, benign   . Osteoarthritis   . History of stroke   . History of pneumonia   . Cervical vertebral fracture   . Chronic back pain   . Benign prostatic hypertrophy     History of urinary retention  . Peptic ulcer disease   . Gastroesophageal reflux disease   . Ischemic cardiomyopathy     LVEF 45-50%; Echo (04/2013):  mild LVH, EF 50%, Gr 1 DD, inf-lat HK, inf-septal and ant-lat HK, Tr MR, mild LAE   Past Surgical History  Procedure Laterality Date  . Tonsillectomy    . Coronary artery bypass graft     Family History  Problem Relation Age of Onset  . Early death      Parents died young  . Appendicitis Mother     Pt was 80 year old  . Heart attack Father 57   History  Substance Use Topics  . Smoking status: Former Smoker -- 2.00 packs/day for 10 years    Types: Cigarettes    Quit date: 07/14/1961  . Smokeless tobacco: Current User    Types: Chew  . Alcohol Use: No  lives at home   Review  of Systems  Respiratory: Negative for cough and shortness of breath.   Cardiovascular: Negative for chest pain.  Gastrointestinal: Positive for diarrhea. Negative for nausea, vomiting and abdominal pain.  Neurological: Positive for weakness. Negative for dizziness and light-headedness.  All other systems reviewed and are negative.   Allergies  Review of patient's allergies indicates no known allergies.  Home Medications   Prior to Admission medications   Medication Sig Start Date End Date Taking? Authorizing Provider  albuterol (PROVENTIL HFA;VENTOLIN HFA) 108 (90 BASE) MCG/ACT inhaler Inhale 2 puffs into the lungs every 6 (six) hours as needed for wheezing or shortness of breath.   Yes Historical Provider, MD  aspirin 325 MG tablet Take 325 mg by mouth daily.   Yes Historical Provider, MD  atorvastatin  (LIPITOR) 40 MG tablet Take 1 tablet (40 mg total) by mouth daily at 6 PM. 05/29/13  Yes Scott T Kathlen Mody, PA-C  carvedilol (COREG) 3.125 MG tablet Take 3.125 mg by mouth 2 (two) times daily with a meal. 01/05/14  Yes Herminio Commons, MD  famotidine (PEPCID) 20 MG tablet Take 1 tablet (20 mg total) by mouth 2 (two) times daily. 09/01/13  Yes Samuella Cota, MD  furosemide (LASIX) 20 MG tablet Take 20 mg by mouth daily as needed for fluid. Take 1 tablet as needed if 3 lb weight gain. 05/06/13  Yes Lendon Colonel, NP  lisinopril (PRINIVIL,ZESTRIL) 2.5 MG tablet Take 1 tablet (2.5 mg total) by mouth daily. 09/23/13  Yes Lendon Colonel, NP  metFORMIN (GLUCOPHAGE) 500 MG tablet Take 500 mg by mouth 2 (two) times daily with a meal.   Yes Historical Provider, MD  Multiple Vitamin (MULTIVITAMIN WITH MINERALS) TABS Take 1 tablet by mouth daily.   Yes Historical Provider, MD  potassium chloride SA (K-DUR,KLOR-CON) 20 MEQ tablet Take 20 mEq by mouth daily as needed (Takes only with Lasix). 04/28/13  Yes Lendon Colonel, NP  ranolazine (RANEXA) 500 MG 12 hr tablet Take 1 tablet (500 mg total) by mouth 2 (two) times daily. 11/01/13  Yes Herminio Commons, MD  Simethicone (MYLANTA GAS PO) Take by mouth daily as needed (indigestion/gas).   Yes Historical Provider, MD  tamsulosin (FLOMAX) 0.4 MG CAPS Take 0.4 mg by mouth daily after supper.  01/27/13  Yes Historical Provider, MD  ticagrelor (BRILINTA) 90 MG TABS tablet Take 1 tablet (90 mg total) by mouth 2 (two) times daily. 12/16/13  Yes Lendon Colonel, NP  nitroGLYCERIN (NITROSTAT) 0.4 MG SL tablet Place 0.4 mg under the tongue every 5 (five) minutes as needed for chest pain.    Historical Provider, MD   Triage Vitals: BP 99/70  Pulse 87  Temp(Src) 97.7 F (36.5 C) (Oral)  Resp 18  Ht 5\' 10"  (1.778 m)  Wt 170 lb (77.111 kg)  BMI 24.39 kg/m2  SpO2 99%  Vital signs normal    Orthostatic VS  Orthostatic Lying - BP- Lying: 109/64 mmHg ;  Pulse- Lying: 80  Orthostatic Sitting - BP- Sitting: 92/56 mmHg ; Pulse- Sitting: 83  Orthostatic Standing at 0 minutes - BP- Standing at 0 minutes: 84/53 mmHg ; Pulse- Standing at 0 minutes: 80  + Orthostatic VS  Physical Exam  Nursing note and vitals reviewed. Constitutional: He is oriented to person, place, and time. He appears well-developed and well-nourished.  Non-toxic appearance. He does not appear ill. No distress.  HENT:  Head: Normocephalic and atraumatic.  Right Ear: External ear normal.  Left Ear: External ear  normal.  Nose: Nose normal. No mucosal edema or rhinorrhea.  Mouth/Throat: Oropharynx is clear and moist and mucous membranes are normal. No dental abscesses or uvula swelling.  Edentulous. Tongue is dry.   Eyes: Conjunctivae and EOM are normal. Pupils are equal, round, and reactive to light.  Neck: Normal range of motion and full passive range of motion without pain. Neck supple.  Cardiovascular: Normal rate, regular rhythm and normal heart sounds.  Exam reveals no gallop and no friction rub.   No murmur heard. Pulmonary/Chest: Effort normal and breath sounds normal. No respiratory distress. He has no wheezes. He has no rhonchi. He has no rales. He exhibits no tenderness and no crepitus.  Abdominal: Soft. Normal appearance and bowel sounds are normal. He exhibits no distension. There is no tenderness. There is no rebound and no guarding.  Musculoskeletal: Normal range of motion. He exhibits no edema and no tenderness.  Moves all extremities well.   Neurological: He is alert and oriented to person, place, and time. He has normal strength. No cranial nerve deficit.  Skin: Skin is warm, dry and intact. No rash noted. No erythema. No pallor.  Psychiatric: He has a normal mood and affect. His speech is normal and behavior is normal. His mood appears not anxious.    ED Course  Procedures (including critical care time)  Medications  sodium chloride 0.9 % bolus 700 mL (0  mLs Intravenous Stopped 01/11/14 1220)   DIAGNOSTIC STUDIES: Oxygen Saturation is 99% on RA, normal by my interpretation.    COORDINATION OF CARE: 9:02 AM- Pt advised of plan for treatment and pt agrees.  10:15 Review of orthostatic vital signs shows that patient is extremely positive with his blood pressure dropping to 84 systolic on standing. IV fluids were ordered for the patient. Most likely had diarrhea and now has some dehydration. His lab results are otherwise normal. Hopefully he can go home after he is hydrated.  12:16:07 Orthostatic Vital Signs RI Orthostatic Lying - BP- Lying: 130/70 mmHg ; Pulse- Lying: 78  Orthostatic Sitting - BP- Sitting: 127/73 mmHg ; Pulse- Sitting: 83  Orthostatic Standing at 0 minutes - BP- Standing at 0 minutes: 131/70 mmHg ; Pulse- Standing at 0 minutes: 81   12:30 Patient was rechecked after getting his IV fluid bolus. His repeat orthostatics showed the hypotension is now gone. Patient states he no longer feels weak when he stands up. He feels ready for discharge.   Labs Review Results for orders placed during the hospital encounter of 01/11/14  CBC WITH DIFFERENTIAL      Result Value Ref Range   WBC 6.3  4.0 - 10.5 K/uL   RBC 4.19 (*) 4.22 - 5.81 MIL/uL   Hemoglobin 13.4  13.0 - 17.0 g/dL   HCT 39.3  39.0 - 52.0 %   MCV 93.8  78.0 - 100.0 fL   MCH 32.0  26.0 - 34.0 pg   MCHC 34.1  30.0 - 36.0 g/dL   RDW 12.8  11.5 - 15.5 %   Platelets 148 (*) 150 - 400 K/uL   Neutrophils Relative % 69  43 - 77 %   Neutro Abs 4.3  1.7 - 7.7 K/uL   Lymphocytes Relative 20  12 - 46 %   Lymphs Abs 1.3  0.7 - 4.0 K/uL   Monocytes Relative 8  3 - 12 %   Monocytes Absolute 0.5  0.1 - 1.0 K/uL   Eosinophils Relative 3  0 - 5 %  Eosinophils Absolute 0.2  0.0 - 0.7 K/uL   Basophils Relative 0  0 - 1 %   Basophils Absolute 0.0  0.0 - 0.1 K/uL  COMPREHENSIVE METABOLIC PANEL      Result Value Ref Range   Sodium 137  137 - 147 mEq/L   Potassium 4.7  3.7 - 5.3 mEq/L    Chloride 98  96 - 112 mEq/L   CO2 26  19 - 32 mEq/L   Glucose, Bld 202 (*) 70 - 99 mg/dL   BUN 15  6 - 23 mg/dL   Creatinine, Ser 1.10  0.50 - 1.35 mg/dL   Calcium 9.5  8.4 - 10.5 mg/dL   Total Protein 6.9  6.0 - 8.3 g/dL   Albumin 3.7  3.5 - 5.2 g/dL   AST 18  0 - 37 U/L   ALT 18  0 - 53 U/L   Alkaline Phosphatase 96  39 - 117 U/L   Total Bilirubin 0.2 (*) 0.3 - 1.2 mg/dL   GFR calc non Af Amer 61 (*) >90 mL/min   GFR calc Af Amer 70 (*) >90 mL/min   Anion gap 13  5 - 15  URINALYSIS, ROUTINE W REFLEX MICROSCOPIC      Result Value Ref Range   Color, Urine YELLOW  YELLOW   APPearance CLEAR  CLEAR   Specific Gravity, Urine 1.010  1.005 - 1.030   pH 7.5  5.0 - 8.0   Glucose, UA NEGATIVE  NEGATIVE mg/dL   Hgb urine dipstick NEGATIVE  NEGATIVE   Bilirubin Urine NEGATIVE  NEGATIVE   Ketones, ur NEGATIVE  NEGATIVE mg/dL   Protein, ur TRACE (*) NEGATIVE mg/dL   Urobilinogen, UA 0.2  0.0 - 1.0 mg/dL   Nitrite NEGATIVE  NEGATIVE   Leukocytes, UA NEGATIVE  NEGATIVE  TROPONIN I      Result Value Ref Range   Troponin I <0.30  <0.30 ng/mL  CK      Result Value Ref Range   Total CK 256 (*) 7 - 232 U/L  URINE MICROSCOPIC-ADD ON      Result Value Ref Range   RBC / HPF 0-2  <3 RBC/hpf   Bacteria, UA FEW (*) RARE   Laboratory interpretation all normal except for hyperglycemia   Imaging Review No results found.   EKG Interpretation   Date/Time:  Wednesday January 11 2014 08:54:13 EDT Ventricular Rate:  81 PR Interval:  175 QRS Duration: 108 QT Interval:  415 QTC Calculation: 482 R Axis:   79 Text Interpretation:  Sinus rhythm Inferior infarct, age indeterminate  Lateral leads are also involved Baseline wander in lead(s) I III aVL V2 V3  V4 V5 Since last tracing 06 Nov 2013 T wave inversion now evident in  Inferolateral leads Confirmed by Lynde Ludwig  MD-I, Antonia Culbertson ([id number]) on 01/11/2014  9:06:38 AM      MDM   Final diagnoses:  Orthostatic hypotension  Dehydration    Plan  discharge  Rolland Porter, MD, FACEP   I personally performed the services described in this documentation, which was scribed in my presence. The recorded information has been reviewed and considered.  Rolland Porter, MD, Abram Sander    Jeffrey Norrie, MD 01/11/14 1537

## 2014-01-24 ENCOUNTER — Emergency Department (HOSPITAL_COMMUNITY)
Admission: EM | Admit: 2014-01-24 | Discharge: 2014-01-24 | Disposition: A | Discharge status: Home / Self Care | Payer: Medicare Other | Attending: Emergency Medicine | Admitting: Emergency Medicine

## 2014-01-24 ENCOUNTER — Encounter (HOSPITAL_COMMUNITY): Payer: Self-pay | Admitting: Emergency Medicine

## 2014-01-24 DIAGNOSIS — I2510 Atherosclerotic heart disease of native coronary artery without angina pectoris: Secondary | ICD-10-CM | POA: Diagnosis not present

## 2014-01-24 DIAGNOSIS — Z7982 Long term (current) use of aspirin: Secondary | ICD-10-CM | POA: Diagnosis not present

## 2014-01-24 DIAGNOSIS — Z79899 Other long term (current) drug therapy: Secondary | ICD-10-CM | POA: Diagnosis not present

## 2014-01-24 DIAGNOSIS — G8929 Other chronic pain: Secondary | ICD-10-CM | POA: Diagnosis not present

## 2014-01-24 DIAGNOSIS — Z9861 Coronary angioplasty status: Secondary | ICD-10-CM | POA: Insufficient documentation

## 2014-01-24 DIAGNOSIS — E119 Type 2 diabetes mellitus without complications: Secondary | ICD-10-CM | POA: Insufficient documentation

## 2014-01-24 DIAGNOSIS — E785 Hyperlipidemia, unspecified: Secondary | ICD-10-CM | POA: Insufficient documentation

## 2014-01-24 DIAGNOSIS — I10 Essential (primary) hypertension: Secondary | ICD-10-CM | POA: Insufficient documentation

## 2014-01-24 DIAGNOSIS — Z87891 Personal history of nicotine dependence: Secondary | ICD-10-CM | POA: Diagnosis not present

## 2014-01-24 DIAGNOSIS — R197 Diarrhea, unspecified: Secondary | ICD-10-CM | POA: Insufficient documentation

## 2014-01-24 DIAGNOSIS — Z951 Presence of aortocoronary bypass graft: Secondary | ICD-10-CM | POA: Insufficient documentation

## 2014-01-24 DIAGNOSIS — Z8781 Personal history of (healed) traumatic fracture: Secondary | ICD-10-CM | POA: Diagnosis not present

## 2014-01-24 DIAGNOSIS — Z8701 Personal history of pneumonia (recurrent): Secondary | ICD-10-CM | POA: Diagnosis not present

## 2014-01-24 DIAGNOSIS — M1990 Unspecified osteoarthritis, unspecified site: Secondary | ICD-10-CM | POA: Diagnosis not present

## 2014-01-24 DIAGNOSIS — Z8673 Personal history of transient ischemic attack (TIA), and cerebral infarction without residual deficits: Secondary | ICD-10-CM | POA: Insufficient documentation

## 2014-01-24 DIAGNOSIS — Z8719 Personal history of other diseases of the digestive system: Secondary | ICD-10-CM | POA: Diagnosis not present

## 2014-01-24 DIAGNOSIS — Z87448 Personal history of other diseases of urinary system: Secondary | ICD-10-CM | POA: Insufficient documentation

## 2014-01-24 LAB — CBC WITH DIFFERENTIAL/PLATELET
BASOS ABS: 0 10*3/uL (ref 0.0–0.1)
BASOS PCT: 0 % (ref 0–1)
EOS ABS: 0.2 10*3/uL (ref 0.0–0.7)
Eosinophils Relative: 3 % (ref 0–5)
HEMATOCRIT: 39.6 % (ref 39.0–52.0)
Hemoglobin: 13.5 g/dL (ref 13.0–17.0)
Lymphocytes Relative: 29 % (ref 12–46)
Lymphs Abs: 1.4 10*3/uL (ref 0.7–4.0)
MCH: 31.8 pg (ref 26.0–34.0)
MCHC: 34.1 g/dL (ref 30.0–36.0)
MCV: 93.4 fL (ref 78.0–100.0)
MONO ABS: 0.4 10*3/uL (ref 0.1–1.0)
Monocytes Relative: 8 % (ref 3–12)
Neutro Abs: 2.9 10*3/uL (ref 1.7–7.7)
Neutrophils Relative %: 60 % (ref 43–77)
PLATELETS: 135 10*3/uL — AB (ref 150–400)
RBC: 4.24 MIL/uL (ref 4.22–5.81)
RDW: 12.6 % (ref 11.5–15.5)
WBC: 5 10*3/uL (ref 4.0–10.5)

## 2014-01-24 LAB — BASIC METABOLIC PANEL
Anion gap: 14 (ref 5–15)
BUN: 13 mg/dL (ref 6–23)
CALCIUM: 9.5 mg/dL (ref 8.4–10.5)
CO2: 26 mEq/L (ref 19–32)
CREATININE: 0.97 mg/dL (ref 0.50–1.35)
Chloride: 99 mEq/L (ref 96–112)
GFR calc Af Amer: 87 mL/min — ABNORMAL LOW (ref >90)
GFR calc non Af Amer: 75 mL/min — ABNORMAL LOW (ref >90)
Glucose, Bld: 197 mg/dL — ABNORMAL HIGH (ref 70–99)
Potassium: 4.3 mEq/L (ref 3.7–5.3)
Sodium: 139 mEq/L (ref 137–147)

## 2014-01-24 LAB — LACTIC ACID, PLASMA: LACTIC ACID, VENOUS: 1.7 mmol/L (ref 0.5–2.2)

## 2014-01-24 LAB — CLOSTRIDIUM DIFFICILE BY PCR: Toxigenic C. Difficile by PCR: NEGATIVE

## 2014-01-24 MED ORDER — LOPERAMIDE HCL 2 MG PO CAPS
4.0000 mg | ORAL_CAPSULE | Freq: Once | ORAL | Status: AC
  Administered 2014-01-24: 4 mg via ORAL
  Filled 2014-01-24: qty 2

## 2014-01-24 MED ORDER — SODIUM CHLORIDE 0.9 % IV SOLN
Freq: Once | INTRAVENOUS | Status: AC
  Administered 2014-01-24: 05:00:00 via INTRAVENOUS

## 2014-01-24 NOTE — Discharge Instructions (Signed)
Your stool does not show evidence of the infection that he had before. Continue standard treatment for diarrhea. Return to the emergency department if you develop fever, bloody stools, or vomiting.  Diarrhea Diarrhea is frequent loose and watery bowel movements. It can cause you to feel weak and dehydrated. Dehydration can cause you to become tired and thirsty, have a dry mouth, and have decreased urination that often is dark yellow. Diarrhea is a sign of another problem, most often an infection that will not last long. In most cases, diarrhea typically lasts 2-3 days. However, it can last longer if it is a sign of something more serious. It is important to treat your diarrhea as directed by your caregive to lessen or prevent future episodes of diarrhea. CAUSES  Some common causes include:  Gastrointestinal infections caused by viruses, bacteria, or parasites.  Food poisoning or food allergies.  Certain medicines, such as antibiotics, chemotherapy, and laxatives.  Artificial sweeteners and fructose.  Digestive disorders. HOME CARE INSTRUCTIONS  Ensure adequate fluid intake (hydration): have 1 cup (8 oz) of fluid for each diarrhea episode. Avoid fluids that contain simple sugars or sports drinks, fruit juices, whole milk products, and sodas. Your urine should be clear or pale yellow if you are drinking enough fluids. Hydrate with an oral rehydration solution that you can purchase at pharmacies, retail stores, and online. You can prepare an oral rehydration solution at home by mixing the following ingredients together:   - tsp table salt.   tsp baking soda.   tsp salt substitute containing potassium chloride.  1  tablespoons sugar.  1 L (34 oz) of water.  Certain foods and beverages may increase the speed at which food moves through the gastrointestinal (GI) tract. These foods and beverages should be avoided and include:  Caffeinated and alcoholic beverages.  High-fiber foods, such  as raw fruits and vegetables, nuts, seeds, and whole grain breads and cereals.  Foods and beverages sweetened with sugar alcohols, such as xylitol, sorbitol, and mannitol.  Some foods may be well tolerated and may help thicken stool including:  Starchy foods, such as rice, toast, pasta, low-sugar cereal, oatmeal, grits, baked potatoes, crackers, and bagels.  Bananas.  Applesauce.  Add probiotic-rich foods to help increase healthy bacteria in the GI tract, such as yogurt and fermented milk products.  Wash your hands well after each diarrhea episode.  Only take over-the-counter or prescription medicines as directed by your caregiver.  Take a warm bath to relieve any burning or pain from frequent diarrhea episodes. SEEK IMMEDIATE MEDICAL CARE IF:   You are unable to keep fluids down.  You have persistent vomiting.  You have blood in your stool, or your stools are black and tarry.  You do not urinate in 6-8 hours, or there is only a small amount of very dark urine.  You have abdominal pain that increases or localizes.  You have weakness, dizziness, confusion, or lightheadedness.  You have a severe headache.  Your diarrhea gets worse or does not get better.  You have a fever or persistent symptoms for more than 2-3 days.  You have a fever and your symptoms suddenly get worse. MAKE SURE YOU:   Understand these instructions.  Will watch your condition.  Will get help right away if you are not doing well or get worse. Document Released: 06/20/2002 Document Revised: 06/16/2012 Document Reviewed: 03/07/2012 [facility] Patient Information 2015 [place], [place]. This information is not intended to replace advice given to you by your health  care provider. Make sure you discuss any questions you have with your health care provider.

## 2014-01-24 NOTE — ED Provider Notes (Signed)
CSN: [id number]     Arrival date & time 01/24/14  0439 History   First MD Initiated Contact with Patient 01/24/14 [phone]     Chief Complaint  Patient presents with  . Diarrhea     (Consider location/radiation/quality/duration/timing/severity/associated sxs/prior Treatment) Patient is a 80 y.o. male presenting with diarrhea. The history is provided by the patient.  Diarrhea He started having diarrhea this evening at about midnight he has had multiple watery bowel movements. He is concerned because he had been hospitalized twice in the past 6 months for a Clostridium difficile colitis. He also noted some leg swelling before he went to bed so he took a dose of furosemide. Overnight, he has taken some Pepto-Bismol and loperamide but they have not helped his diarrhea. He denies any blood or mucus in the stool. He has not had any chest pain but he did break out in a sweat. There has been no nausea or vomiting.  Past Medical History  Diagnosis Date  . Diabetes mellitus, type II   . Hyperlipidemia     Lipid profile in 02/2012:135, 227, 41, 49  . Coronary atherosclerosis of native coronary artery     a. CABG x 4 in 1989 (VG->OM1->OM2, VG->RCA, LIMA->LAD), b. 05/2010: DES to VG-OM1/OM2, DES to distal LCx. c. NSTEMI in 04/2011 - TO distal LCX stent and VG->OM2. d. 02/2012 NSTEMI DES to VG-OM1/continuation to OM2 occluded. e. inferior STEMI s/p DES to SVG-RAMUS 06/2012. f. inferolat STEMI 09/2012 s/p DES to SVG-interm; g. Lex MV (11/14):  EF 35%, inf-lat scar with small peri-infarct ischemia  . Essential hypertension, benign   . Osteoarthritis   . History of stroke   . History of pneumonia   . Cervical vertebral fracture   . Chronic back pain   . Benign prostatic hypertrophy     History of urinary retention  . Peptic ulcer disease   . Gastroesophageal reflux disease   . Ischemic cardiomyopathy     LVEF 45-50%; Echo (04/2013):  mild LVH, EF 50%, Gr 1 DD, inf-lat HK, inf-septal and ant-lat HK, Tr MR,  mild LAE   Past Surgical History  Procedure Laterality Date  . Tonsillectomy    . Coronary artery bypass graft     Family History  Problem Relation Age of Onset  . Early death      Parents died young  . Appendicitis Mother     Pt was 40 year old  . Heart attack Father 70   History  Substance Use Topics  . Smoking status: Former Smoker -- 2.00 packs/day for 10 years    Types: Cigarettes    Quit date: 07/14/1961  . Smokeless tobacco: Current User    Types: Chew  . Alcohol Use: No    Review of Systems  Gastrointestinal: Positive for diarrhea.  All other systems reviewed and are negative.     Allergies  Review of patient's allergies indicates no known allergies.  Home Medications   Prior to Admission medications   Medication Sig Start Date End Date Taking? Authorizing Provider  albuterol (PROVENTIL HFA;VENTOLIN HFA) 108 (90 BASE) MCG/ACT inhaler Inhale 2 puffs into the lungs every 6 (six) hours as needed for wheezing or shortness of breath.    Historical Provider, MD  aspirin 325 MG tablet Take 325 mg by mouth daily.    Historical Provider, MD  atorvastatin (LIPITOR) 40 MG tablet Take 1 tablet (40 mg total) by mouth daily at 6 PM. 05/29/13   Liliane Shi, PA-C  carvedilol (COREG)  3.125 MG tablet Take 3.125 mg by mouth 2 (two) times daily with a meal. 01/05/14   Herminio Commons, MD  famotidine (PEPCID) 20 MG tablet Take 1 tablet (20 mg total) by mouth 2 (two) times daily. 09/01/13   Samuella Cota, MD  furosemide (LASIX) 20 MG tablet Take 20 mg by mouth daily as needed for fluid. Take 1 tablet as needed if 3 lb weight gain. 05/06/13   Lendon Colonel, NP  lisinopril (PRINIVIL,ZESTRIL) 2.5 MG tablet Take 1 tablet (2.5 mg total) by mouth daily. 09/23/13   Lendon Colonel, NP  metFORMIN (GLUCOPHAGE) 500 MG tablet Take 500 mg by mouth 2 (two) times daily with a meal.    Historical Provider, MD  Multiple Vitamin (MULTIVITAMIN WITH MINERALS) TABS Take 1 tablet by mouth  daily.    Historical Provider, MD  nitroGLYCERIN (NITROSTAT) 0.4 MG SL tablet Place 0.4 mg under the tongue every 5 (five) minutes as needed for chest pain.    Historical Provider, MD  potassium chloride SA (K-DUR,KLOR-CON) 20 MEQ tablet Take 20 mEq by mouth daily as needed (Takes only with Lasix). 04/28/13   Lendon Colonel, NP  ranolazine (RANEXA) 500 MG 12 hr tablet Take 1 tablet (500 mg total) by mouth 2 (two) times daily. 11/01/13   Herminio Commons, MD  Simethicone (MYLANTA GAS PO) Take by mouth daily as needed (indigestion/gas).    Historical Provider, MD  tamsulosin (FLOMAX) 0.4 MG CAPS Take 0.4 mg by mouth daily after supper.  01/27/13   Historical Provider, MD  ticagrelor (BRILINTA) 90 MG TABS tablet Take 1 tablet (90 mg total) by mouth 2 (two) times daily. 12/16/13   Lendon Colonel, NP   BP 128/75  Pulse 80  Temp(Src) 98.2 F (36.8 C) (Oral)  Resp 20  Ht 5\' 10"  (1.778 m)  Wt 175 lb (79.379 kg)  BMI 25.11 kg/m2  SpO2 99% Physical Exam  Nursing note and vitals reviewed.  80 year old male, resting comfortably and in no acute distress. Vital signs are normal. Oxygen saturation is 99%, which is normal. Head is normocephalic and atraumatic. PERRLA, EOMI. Oropharynx is clear. Neck is nontender and supple without adenopathy or JVD. Back is nontender and there is no CVA tenderness. Lungs are clear without rales, wheezes, or rhonchi. Chest is nontender. Heart has regular rate and rhythm without murmur. Abdomen is soft, flat, with mild tenderness diffusely. There is no rebound or guarding. There are no masses or hepatosplenomegaly and peristalsis is normoactive. Extremities have 1+ edema, full range of motion is present. Skin is warm and dry without rash. Neurologic: Mental status is normal, cranial nerves are intact, there are no motor or sensory deficits.  ED Course  Procedures (including critical care time) Labs Review Results for orders placed during the hospital  encounter of 01/24/14  CLOSTRIDIUM DIFFICILE BY PCR      Result Value Ref Range   C difficile by pcr NEGATIVE  NEGATIVE  CBC WITH DIFFERENTIAL      Result Value Ref Range   WBC 5.0  4.0 - 10.5 K/uL   RBC 4.24  4.22 - 5.81 MIL/uL   Hemoglobin 13.5  13.0 - 17.0 g/dL   HCT 39.6  39.0 - 52.0 %   MCV 93.4  78.0 - 100.0 fL   MCH 31.8  26.0 - 34.0 pg   MCHC 34.1  30.0 - 36.0 g/dL   RDW 12.6  11.5 - 15.5 %   Platelets 135 (*) 150 -  400 K/uL   Neutrophils Relative % 60  43 - 77 %   Neutro Abs 2.9  1.7 - 7.7 K/uL   Lymphocytes Relative 29  12 - 46 %   Lymphs Abs 1.4  0.7 - 4.0 K/uL   Monocytes Relative 8  3 - 12 %   Monocytes Absolute 0.4  0.1 - 1.0 K/uL   Eosinophils Relative 3  0 - 5 %   Eosinophils Absolute 0.2  0.0 - 0.7 K/uL   Basophils Relative 0  0 - 1 %   Basophils Absolute 0.0  0.0 - 0.1 K/uL  LACTIC ACID, PLASMA      Result Value Ref Range   Lactic Acid, Venous 1.7  0.5 - 2.2 mmol/L  BASIC METABOLIC PANEL      Result Value Ref Range   Sodium 139  137 - 147 mEq/L   Potassium 4.3  3.7 - 5.3 mEq/L   Chloride 99  96 - 112 mEq/L   CO2 26  19 - 32 mEq/L   Glucose, Bld 197 (*) 70 - 99 mg/dL   BUN 13  6 - 23 mg/dL   Creatinine, Ser 0.97  0.50 - 1.35 mg/dL   Calcium 9.5  8.4 - 10.5 mg/dL   GFR calc non Af Amer 75 (*) >90 mL/min   GFR calc Af Amer 87 (*) >90 mL/min   Anion gap 14  5 - 15   MDM   Final diagnoses:  Diarrhea    Diarrhea which is worrisome for recurrent Clostridium difficile colitis. Screening labs are obtained and is started on IV hydration. Old records are reviewed, confirming 2 admissions in the last 6 months for Clostridium difficile colitis.  Patient states that he is feeling somewhat better. He has provided a stool sample which has been sent for testing for Clostridium difficile.  Stool was negative for Clostridium difficile. He has had only one bowel movement while in the emergency department, which is a significant decrease in frequency. He shows no  signs of dehydration. He is discharged with instructions to continue taking loperamide as needed and return to the ED if symptoms worsen.  Delora Fuel, MD 85/46/27 [id number]

## 2014-01-24 NOTE — ED Notes (Signed)
Patient given urinal to void

## 2014-01-24 NOTE — ED Notes (Signed)
Pt c/o diarrhea since midnight.

## 2014-03-11 ENCOUNTER — Emergency Department (HOSPITAL_COMMUNITY)
Admission: EM | Admit: 2014-03-11 | Discharge: 2014-03-11 | Disposition: A | Discharge status: Home / Self Care | Payer: Medicare Other | Attending: Emergency Medicine | Admitting: Emergency Medicine

## 2014-03-11 ENCOUNTER — Encounter (HOSPITAL_COMMUNITY): Payer: Self-pay | Admitting: Emergency Medicine

## 2014-03-11 ENCOUNTER — Emergency Department (HOSPITAL_COMMUNITY): Payer: Medicare Other

## 2014-03-11 DIAGNOSIS — Z8673 Personal history of transient ischemic attack (TIA), and cerebral infarction without residual deficits: Secondary | ICD-10-CM | POA: Diagnosis not present

## 2014-03-11 DIAGNOSIS — Z8781 Personal history of (healed) traumatic fracture: Secondary | ICD-10-CM | POA: Diagnosis not present

## 2014-03-11 DIAGNOSIS — Z7982 Long term (current) use of aspirin: Secondary | ICD-10-CM | POA: Diagnosis not present

## 2014-03-11 DIAGNOSIS — Z87891 Personal history of nicotine dependence: Secondary | ICD-10-CM | POA: Insufficient documentation

## 2014-03-11 DIAGNOSIS — Z951 Presence of aortocoronary bypass graft: Secondary | ICD-10-CM | POA: Insufficient documentation

## 2014-03-11 DIAGNOSIS — Z79899 Other long term (current) drug therapy: Secondary | ICD-10-CM | POA: Insufficient documentation

## 2014-03-11 DIAGNOSIS — I10 Essential (primary) hypertension: Secondary | ICD-10-CM | POA: Insufficient documentation

## 2014-03-11 DIAGNOSIS — G8929 Other chronic pain: Secondary | ICD-10-CM | POA: Insufficient documentation

## 2014-03-11 DIAGNOSIS — R079 Chest pain, unspecified: Secondary | ICD-10-CM

## 2014-03-11 DIAGNOSIS — I2510 Atherosclerotic heart disease of native coronary artery without angina pectoris: Secondary | ICD-10-CM | POA: Insufficient documentation

## 2014-03-11 DIAGNOSIS — Z8701 Personal history of pneumonia (recurrent): Secondary | ICD-10-CM | POA: Diagnosis not present

## 2014-03-11 DIAGNOSIS — E119 Type 2 diabetes mellitus without complications: Secondary | ICD-10-CM | POA: Diagnosis not present

## 2014-03-11 DIAGNOSIS — K219 Gastro-esophageal reflux disease without esophagitis: Secondary | ICD-10-CM | POA: Diagnosis not present

## 2014-03-11 DIAGNOSIS — M1990 Unspecified osteoarthritis, unspecified site: Secondary | ICD-10-CM | POA: Insufficient documentation

## 2014-03-11 LAB — TROPONIN I

## 2014-03-11 LAB — CBC WITH DIFFERENTIAL/PLATELET
BASOS PCT: 0 % (ref 0–1)
Basophils Absolute: 0 10*3/uL (ref 0.0–0.1)
EOS ABS: 0.2 10*3/uL (ref 0.0–0.7)
Eosinophils Relative: 3 % (ref 0–5)
HCT: 37 % — ABNORMAL LOW (ref 39.0–52.0)
Hemoglobin: 12.6 g/dL — ABNORMAL LOW (ref 13.0–17.0)
LYMPHS ABS: 1.6 10*3/uL (ref 0.7–4.0)
Lymphocytes Relative: 28 % (ref 12–46)
MCH: 31.4 pg (ref 26.0–34.0)
MCHC: 34.1 g/dL (ref 30.0–36.0)
MCV: 92.3 fL (ref 78.0–100.0)
Monocytes Absolute: 0.5 10*3/uL (ref 0.1–1.0)
Monocytes Relative: 9 % (ref 3–12)
Neutro Abs: 3.4 10*3/uL (ref 1.7–7.7)
Neutrophils Relative %: 60 % (ref 43–77)
PLATELETS: 140 10*3/uL — AB (ref 150–400)
RBC: 4.01 MIL/uL — ABNORMAL LOW (ref 4.22–5.81)
RDW: 13.1 % (ref 11.5–15.5)
WBC: 5.7 10*3/uL (ref 4.0–10.5)

## 2014-03-11 LAB — BASIC METABOLIC PANEL
Anion gap: 13 (ref 5–15)
BUN: 13 mg/dL (ref 6–23)
CALCIUM: 9 mg/dL (ref 8.4–10.5)
CO2: 24 mEq/L (ref 19–32)
Chloride: 98 mEq/L (ref 96–112)
Creatinine, Ser: 0.85 mg/dL (ref 0.50–1.35)
GFR calc Af Amer: >90 mL/min (ref >90)
GFR, EST NON AFRICAN AMERICAN: 78 mL/min — AB (ref >90)
Glucose, Bld: 226 mg/dL — ABNORMAL HIGH (ref 70–99)
Potassium: 4 mEq/L (ref 3.7–5.3)
SODIUM: 135 meq/L — AB (ref 137–147)

## 2014-03-11 NOTE — Discharge Instructions (Signed)
°Emergency Department Resource Guide °1) Find a Doctor and Pay Out of Pocket °Although you won't have to find out who is covered by your insurance plan, it is a good idea to ask around and get recommendations. You will then need to call the office and see if the doctor you have chosen will accept you as a new patient and what types of options they offer for patients who are self-pay. Some doctors offer discounts or will set up payment plans for their patients who do not have insurance, but you will need to ask so you aren't surprised when you get to your appointment. ° °2) Contact Your Local Health Department °Not all health departments have doctors that can see patients for sick visits, but many do, so it is worth a call to see if yours does. If you don't know where your local health department is, you can check in your phone book. The CDC also has a tool to help you locate your state's health department, and many state websites also have listings of all of their local health departments. ° °3) Find a Walk-in Clinic °If your illness is not likely to be very severe or complicated, you may want to try a walk in clinic. These are popping up all over the country in pharmacies, drugstores, and shopping centers. They're usually staffed by nurse practitioners or physician assistants that have been trained to treat common illnesses and complaints. They're usually fairly quick and inexpensive. However, if you have serious medical issues or chronic medical problems, these are probably not your best option. ° °No Primary Care Doctor: °- Call [facility] at  832-8000 - they can help you locate a primary care doctor that  accepts your insurance, provides certain services, etc. °- Physician Referral Service- 1-800-533-3463 ° °Chronic Pain Problems: °Organization         Address  Phone   Notes  °[facility]  (336) [phone] Patients need to be referred by their primary care doctor.  ° °Medication  Assistance: °Organization         Address  Phone   Notes  °[place] [facility] [address] °[place], [place] [address] (336) 641-8030 --Must be a resident of [place] °-- Must have NO insurance coverage whatsoever (no Medicaid/ Medicare, etc.) °-- The pt. MUST have a primary care doctor that directs their care regularly and follows them in the community °  °[facility]  (866) 331-1348   °[employer]  (888) 892-1162   ° °Agencies that provide inexpensive medical care: °Organization         Address  Phone   Notes  °[facility]  (336) 832-8035   °[facility]    (336) 832-7272   °[facility] [address] °[place], [place] [address] (336) 832-4777   °[facility] [address], °[place] (336) 271-4999   °[facility]    (336) 373-0678   °[facility]    (336) 272-1050   °[facility] ° [address], [place] Phone:  (336) 832-4444, Fax:  (336) 832-4440 Hours of Operation:  9 am - 6 pm, M-F.  Also accepts Medicaid/Medicare and self-pay.  °[facility] ° [address], [place] Phone: (336) [phone], Fax: (336) [phone]. Hours of Operation:  8:30 am - 5:30 pm, M-F.  Also accepts Medicaid and self-pay.  °[facility] [place] [address], [place] Phone: (336) 878-6027   °[facility] [address], [place], [place] (336)723-1848, Ext. 123 Mondays & Thursdays: 7-9 AM.  First 15 patients are seen on a first come, first serve basis. °  ° °Medicaid-accepting [place] Providers: ° °Organization         Address  Phone   Notes  °[facility] [address], [place] (336) 641-2100 Also accepts self-pay patients.  °[facility] [address], [place] ° (336) [phone]   °[facility] [address], [place]  (336) 288-8857   °[facility] [address] [place] [address], [place] (336) 299-7000   °Veita Bland [address], [place]  ° (336) 373-1557 Only accepts [place] Medicaid patients after they have their name applied to their card.  ° °Self-Pay (no insurance) in [place]: ° °Organization         Address  Phone   Notes  °Sickle Cell Patients, [facility] [address], [place] (336) 832-1970   °[facility] [address], [place] (336) 832-4400   °[facility] ° [address], [place] (336) 992-4800   °[facility]/Dr. Osei-Bonsu ° [address] [place] [address], [place] or [address], [place] (336) [phone] Phone number for both [place] and [place] locations is the same.  °[facility] [address], [place] (336) 299-0000   °[facility] [address] [place] [address], [place] or [address] (336) 852-7530 °(336) 878-2260   °[facility] [address], [place] (336) 350-1642, phone; (336) 294-5005, fax Sees patients 1st and 3rd Saturday of every month.  Must not qualify for public or private insurance (i.e. Medicaid, Medicare, [place] Health Choice, Veterans' Benefits) • Household income should be no more than 200% of the poverty level •The clinic cannot treat you if you are pregnant or think you are pregnant • Sexually transmitted diseases are not treated at the clinic.  ° ° °Dental Care: °Organization         Address  Phone  Notes  °[place] [facility] [address], [place] (336) 641-6152 Accepts children up to age 21 who are enrolled in Medicaid or [place] Health Choice; pregnant women with a Medicaid card; and children who have applied for Medicaid or [place] Health Choice, but were declined, whose parents can pay a reduced fee at time of service.  °[place]  [facility] [place]  [address], [place] (336) 641-7733 Accepts children up to age 21 who are enrolled in Medicaid or [place] Health Choice; pregnant women with a Medicaid card; and children who have applied for Medicaid or [place] Health Choice, but were declined, whose parents can pay a reduced fee at time of service.  °[facility] ° [address], [place] (336) 641-4533 Patients are seen by appointment only. Walk-ins are not accepted. [facility] will see patients 18 years of age and older. °Monday - Tuesday (8am-5pm) °Most Wednesdays (8:30-5pm) °$30 per visit, cash only  °[facility] ° [address], [place] (336) 641-4533 Patients are seen by appointment only. Walk-ins are not accepted. [facility] will see patients 18 years of age and older. °One   Wednesday Evening (Monthly: Volunteer Based).  $30 per visit, cash only  °[facility]  (919) [phone] for adults; Children under age 4, call Graduate Pediatric Dentistry at ([phone]) [phone]. Children aged 4-14, please call ([phone]) [phone] to request a pediatric application. ° Dental services are provided in all areas of dental care including fillings, crowns and bridges, complete and partial dentures, implants, gum treatment, root canals, and extractions. Preventive care is also provided. Treatment is provided to both adults and children. °Patients are selected via a lottery and there is often a waiting list. °  °[facility] [address], °[place] ° (336) 763-8833 www.drcivils.com °  °[facility] [address], [place], [place] (336)723-1848, Ext. 123 Second and Fourth Thursday of each month, opens at 6:30 AM; Clinic ends at 9 AM.  Patients are seen on a first-come first-served basis, and a limited number are seen during each clinic.  ° °[facility] ° [address], [place], [place] (336) [phone]    Eligibility Requirements °You must have lived in [place], [place], or [place] counties for at least the last three months. °  You cannot be eligible for state or federal sponsored healthcare insurance, including Veterans Administration, Medicaid, or Medicare. °  You generally cannot be eligible for healthcare insurance through your employer.  °  How to apply: °Eligibility screenings are held every Tuesday and Wednesday afternoon from 1:00 pm until 4:00 pm. You do not need an appointment for the interview!  °[facility] [address], [place], [place] 336-631-2330   °[facility]  336-342-8273   °[place] [facility]  336-703-3100   ° [facility]  336-570-6415   ° °Behavioral Health Resources in the Community: °Intensive Outpatient Programs °Organization         Address  Phone  Notes  °[facility] [address], [place], [place] 336-878-6098   °[facility] [address], [place], [place] 336-832-9800   °[facility] [address], [place], [place] ° 336-882-2125   °[facility] [address],  °[place], [place] 1-800-853-5163 or 336-641-4981   °Substance Abuse Resources °Organization         Address  Phone  Notes  °[facility]  336-882-2125   °[facility]  336-784-9470   °[facility]  336-285-9073   °[facility]  336-845-3988   °Residential & Outpatient Substance Abuse Program  1-800-659-3381   °Psychological Services °Organization         Address  Phone  Notes  °[facility]  336- 832-9600   °[facility]  336- 378-7881   °[facility] [address], [place] 1-800-853-5163 or 336-641-4981   ° °Mobile Crisis Teams °Organization         Address  Phone  Notes  °[facility], Mobile Crisis Care Unit  1-877-626-1772   °Assertive °[facility] ° [address]  [place], [place] 336-834-9664   °Sharon DeEsch [address] °[place] 336-554-5454   ° °Self-Help/Support Groups °Organization         Address  Phone             Notes  °[facility] - variety of support groups  336- 373-1402 Call for more information  °Narcotics Anonymous (NA), [facility] [address], °[place]  2 meetings at this location  ° °  Residential Treatment Programs Organization         Address  Phone  Notes  [facility] [address],    [place]  1-(240) 087-6814   [facility]  [address] [place] [id number], [place], [place]   [place], [place] [phone] Admissions: 8am-3pm M-F  [facility] [place] [address],    [place], [place] [phone]   [facility] [address] [place], [place], [place]   [facility] [address],  [place], [place]   [facility] - Intensive Outpatient [address], [place], [place]   [facility] ([place] [place]  [place], [place] [phone] or [phone]   [facility] [address] [place], [place] Accepts Medicaid  [facility] [address]  [place] 1-(828)163-2613 Substance Abuse/Addiction Treatment   [facility]         Address  Phone  Notes  [facility]  386-031-0606   Domenic Schwab, PhD [address] [place], [place]   519-517-6176 or 6140878054   [facility] [place], [place] [phone]   [facility] [address] [place], [place] ([phone] Insurance/Medicaid/sponsorship through [facility] [address]                                    [place], [place] [phone] [place], [place] [phone]    Dr. Adele Schilder  [phone]   [facility] Dept. 1) [address], [place] 2) [place] 3)  [address], [place] [phone]  [phone]   [place] [phone] or [phone] (After Hours)       Take your usual prescriptions as previously directed.  Call your regular Cardiologist on Monday to schedule a follow up appointment within the next 2 days.  Return to the Emergency Department immediately sooner if worsening.

## 2014-03-11 NOTE — ED Notes (Signed)
Pt reports intermittent chest pain since 30 minutes ago. Pt reports took baby aspirin and one nitro prior to arrival pt denies any current chest pain at this time.nad noted.

## 2014-03-11 NOTE — ED Provider Notes (Signed)
CSN: [id number]     Arrival date & time 03/11/14  1604 History   First MD Initiated Contact with Patient 03/11/14 1646     Chief Complaint  Patient presents with  . Chest Pain      HPI Pt was seen at 1715. Per pt, c/o gradual onset and persistence of constant upper mid-sternal chest "pain" that began approximately 30 minutes PTA. Pt states he was "laying down after eating" when his symptoms began. Pt describes the pain as "sharp." State took Pepto Bismol without relief. Pt stated he then took ASA and SL ntg with "some" relief. Pt states since arrival to the ED he is "all better." Denies palpitations, no SOB/cough, no abd pain, no N/V/D, no back pain, no choking.    Past Medical History  Diagnosis Date  . Diabetes mellitus, type II   . Hyperlipidemia     Lipid profile in 02/2012:135, 227, 41, 49  . Coronary atherosclerosis of native coronary artery     a. CABG x 4 in 1989 (VG->OM1->OM2, VG->RCA, LIMA->LAD), b. 05/2010: DES to VG-OM1/OM2, DES to distal LCx. c. NSTEMI in 04/2011 - TO distal LCX stent and VG->OM2. d. 02/2012 NSTEMI DES to VG-OM1/continuation to OM2 occluded. e. inferior STEMI s/p DES to SVG-RAMUS 06/2012. f. inferolat STEMI 09/2012 s/p DES to SVG-interm; g. Lex MV (11/14):  EF 35%, inf-lat scar with small peri-infarct ischemia  . Essential hypertension, benign   . Osteoarthritis   . History of stroke   . History of pneumonia   . Cervical vertebral fracture   . Chronic back pain   . Benign prostatic hypertrophy     History of urinary retention  . Peptic ulcer disease   . Gastroesophageal reflux disease   . Ischemic cardiomyopathy     LVEF 45-50%; Echo (04/2013):  mild LVH, EF 50%, Gr 1 DD, inf-lat HK, inf-septal and ant-lat HK, Tr MR, mild LAE   Past Surgical History  Procedure Laterality Date  . Tonsillectomy    . Coronary artery bypass graft     Family History  Problem Relation Age of Onset  . Early death      Parents died young  . Appendicitis Mother     Pt was  80 year old  . Heart attack Father 31   History  Substance Use Topics  . Smoking status: Former Smoker -- 2.00 packs/day for 10 years    Types: Cigarettes    Quit date: 07/14/1961  . Smokeless tobacco: Current User    Types: Chew  . Alcohol Use: No    Review of Systems ROS: Statement: All systems negative except as marked or noted in the HPI; Constitutional: Negative for fever and chills. ; ; Eyes: Negative for eye pain, redness and discharge. ; ; ENMT: Negative for ear pain, hoarseness, nasal congestion, sinus pressure and sore throat. ; ; Cardiovascular: +CP. Negative for palpitations, diaphoresis, dyspnea and peripheral edema. ; ; Respiratory: Negative for cough, wheezing and stridor. ; ; Gastrointestinal: Negative for nausea, vomiting, diarrhea, abdominal pain, blood in stool, hematemesis, jaundice and rectal bleeding. . ; ; Genitourinary: Negative for dysuria, flank pain and hematuria. ; ; Musculoskeletal: Negative for back pain and neck pain. Negative for swelling and trauma.; ; Skin: Negative for pruritus, rash, abrasions, blisters, bruising and skin lesion.; ; Neuro: Negative for headache, lightheadedness and neck stiffness. Negative for weakness, altered level of consciousness , altered mental status, extremity weakness, paresthesias, involuntary movement, seizure and syncope.      Allergies  Review  of patient's allergies indicates no known allergies.  Home Medications   Prior to Admission medications   Medication Sig Start Date End Date Taking? Authorizing Provider  albuterol (PROVENTIL HFA;VENTOLIN HFA) 108 (90 BASE) MCG/ACT inhaler Inhale 2 puffs into the lungs every 6 (six) hours as needed for wheezing or shortness of breath.   Yes Historical Provider, MD  aspirin 325 MG tablet Take 325 mg by mouth daily.   Yes Historical Provider, MD  carvedilol (COREG) 3.125 MG tablet Take 3.125 mg by mouth 2 (two) times daily with a meal. 01/05/14  Yes Herminio Commons, MD  famotidine  (PEPCID) 20 MG tablet Take 1 tablet (20 mg total) by mouth 2 (two) times daily. 09/01/13  Yes Samuella Cota, MD  furosemide (LASIX) 20 MG tablet Take 20 mg by mouth daily as needed for fluid. Take 1 tablet as needed if 3 lb weight gain. 05/06/13  Yes Lendon Colonel, NP  lisinopril (PRINIVIL,ZESTRIL) 2.5 MG tablet Take 1 tablet (2.5 mg total) by mouth daily. 09/23/13  Yes Lendon Colonel, NP  metFORMIN (GLUCOPHAGE) 500 MG tablet Take 500 mg by mouth 2 (two) times daily with a meal.   Yes Historical Provider, MD  Multiple Vitamin (MULTIVITAMIN WITH MINERALS) TABS Take 1 tablet by mouth daily.   Yes Historical Provider, MD  nitroGLYCERIN (NITROSTAT) 0.4 MG SL tablet Place 0.4 mg under the tongue every 5 (five) minutes as needed for chest pain.   Yes Historical Provider, MD  potassium chloride SA (K-DUR,KLOR-CON) 20 MEQ tablet Take 20 mEq by mouth daily as needed (Takes only with Lasix). 04/28/13  Yes Lendon Colonel, NP  ranolazine (RANEXA) 500 MG 12 hr tablet Take 1 tablet (500 mg total) by mouth 2 (two) times daily. 11/01/13  Yes Herminio Commons, MD  tamsulosin (FLOMAX) 0.4 MG CAPS Take 0.4 mg by mouth daily after supper.  01/27/13  Yes Historical Provider, MD  ticagrelor (BRILINTA) 90 MG TABS tablet Take 1 tablet (90 mg total) by mouth 2 (two) times daily. 12/16/13  Yes Lendon Colonel, NP  atorvastatin (LIPITOR) 40 MG tablet Take 1 tablet (40 mg total) by mouth daily at 6 PM. 05/29/13   Liliane Shi, PA-C  Simethicone (MYLANTA GAS PO) Take by mouth daily as needed (indigestion/gas).    Historical Provider, MD   BP 162/72  Pulse 74  Temp(Src) 98.6 F (37 C) (Oral)  Resp 14  Ht 5\' 10"  (1.778 m)  Wt 180 lb (81.647 kg)  BMI 25.83 kg/m2  SpO2 98% Physical Exam 1720: Physical examination:  Nursing notes reviewed; Vital signs and O2 SAT reviewed;  Constitutional: Well developed, Well nourished, Well hydrated, In no acute distress; Head:  Normocephalic, atraumatic; Eyes: EOMI,  PERRL, No scleral icterus; ENMT: Mouth and pharynx normal, Mucous membranes moist; Neck: Supple, Full range of motion, No lymphadenopathy; Cardiovascular: Regular rate and rhythm, No gallop; Respiratory: Breath sounds clear & equal bilaterally, No wheezes.  Speaking full sentences with ease, Normal respiratory effort/excursion; Chest: Nontender, Movement normal; Abdomen: Soft, Nontender, Nondistended, Normal bowel sounds; Genitourinary: No CVA tenderness; Extremities: Pulses normal, No tenderness, No edema, No calf edema or asymmetry.; Neuro: AA&Ox3. Vague, rambling historian. Major CN grossly intact.  Speech clear. No gross focal motor or sensory deficits in extremities.; Skin: Color normal, Warm, Dry.   ED Course  Procedures     EKG Interpretation   Date/Time:  Saturday March 11 2014 16:07:52 EDT Ventricular Rate:  86 PR Interval:  174 QRS Duration: 100  QT Interval:  414 QTC Calculation: 495 R Axis:   84 Text Interpretation:  Normal sinus rhythm Nonspecific ST and T wave  abnormality Prolonged QT Abnormal ECG When compared with ECG of 01/11/2014 T  wave abnormality Inferolateral leads is no longer Present QT has  lengthened Confirmed by [facility]  MD, Nunzio Cory [phone]) on 03/11/2014  8:59:57 PM      MDM  MDM Reviewed: previous chart, nursing note and vitals Reviewed previous: labs and ECG Interpretation: labs, ECG and x-ray    Results for orders placed during the hospital encounter of 03/11/14  CBC WITH DIFFERENTIAL      Result Value Ref Range   WBC 5.7  4.0 - 10.5 K/uL   RBC 4.01 (*) 4.22 - 5.81 MIL/uL   Hemoglobin 12.6 (*) 13.0 - 17.0 g/dL   HCT 37.0 (*) 39.0 - 52.0 %   MCV 92.3  78.0 - 100.0 fL   MCH 31.4  26.0 - 34.0 pg   MCHC 34.1  30.0 - 36.0 g/dL   RDW 13.1  11.5 - 15.5 %   Platelets 140 (*) 150 - 400 K/uL   Neutrophils Relative % 60  43 - 77 %   Neutro Abs 3.4  1.7 - 7.7 K/uL   Lymphocytes Relative 28  12 - 46 %   Lymphs Abs 1.6  0.7 - 4.0 K/uL   Monocytes  Relative 9  3 - 12 %   Monocytes Absolute 0.5  0.1 - 1.0 K/uL   Eosinophils Relative 3  0 - 5 %   Eosinophils Absolute 0.2  0.0 - 0.7 K/uL   Basophils Relative 0  0 - 1 %   Basophils Absolute 0.0  0.0 - 0.1 K/uL  BASIC METABOLIC PANEL      Result Value Ref Range   Sodium 135 (*) 137 - 147 mEq/L   Potassium 4.0  3.7 - 5.3 mEq/L   Chloride 98  96 - 112 mEq/L   CO2 24  19 - 32 mEq/L   Glucose, Bld 226 (*) 70 - 99 mg/dL   BUN 13  6 - 23 mg/dL   Creatinine, Ser 0.85  0.50 - 1.35 mg/dL   Calcium 9.0  8.4 - 10.5 mg/dL   GFR calc non Af Amer 78 (*) >90 mL/min   GFR calc Af Amer >90  >90 mL/min   Anion gap 13  5 - 15  TROPONIN I      Result Value Ref Range   Troponin I <0.30  <0.30 ng/mL   Dg Chest Port 1 View 03/11/2014   CLINICAL DATA:  Chest pain. History of coronary stents and open heart surgery.  EXAM: PORTABLE CHEST - 1 VIEW  COMPARISON:  One-view chest 11/05/2013.  FINDINGS: The heart is mildly enlarged. Mild pulmonary vascular congestion is again noted. Postsurgical changes are evident. Minimal atelectasis or scarring is present at the left base.  IMPRESSION: 1. Borderline cardiomegaly and mild pulmonary vascular congestion. 2. Scarring or atelectasis at the left base.   Electronically Signed   By: Lawrence Santiago M.D.   On: 03/11/2014 17:13    2045: Pt with multiple versions of his HPI to myself, Triage RN, and ED RN x2 caring for him during his ED stay. Pt initially stating his discomfort was near constant, then stated it "only lasted 2 to 5 seconds." Pt also stated "it started after I ate hot sauce," then stated "it happened while I was in the shed lifting something I shouldn't have." Stated "it  started at noon" then stated "it started at 1:00pm," then stated "it started right before I got here." Pt initially described his pain as "like when I had my heart attacks," then stated "oh maybe not." Very confusing historian. States "I got anxiety and then I don't remember very well."  Pt states  he continues to feel "better" since arrival to the ED, he does not want any further testing, and wants to go home now.  Due to pt's confusing HPI, and significant cardiac hx, I recommended observation admission for further evaluation.  Pt refuses admission, stating he "already has a heart doctor appointment Sept 1st."  ED RN and I encouraged pt to stay, continues to refuse.  Pt makes his own medical decisions.  Risks of AMA explained to pt, including, but not limited to:  stroke, heart attack, cardiac arrythmia ("irregular heart rate/beat"), "passing out," temporary and/or permanent disability, death.  Pt verb understanding and continues to refuse observation admission, understanding the consequences of his decision.  I encouraged pt to follow up with his Cardiologist on Tuesday as previously scheduled and return to the ED immediately if symptoms return, or for any other concerns.  Pt verb understanding, agreeable.     Francine Graven, DO 03/13/14 1708

## 2014-03-12 ENCOUNTER — Encounter (HOSPITAL_COMMUNITY): Payer: Self-pay | Admitting: Emergency Medicine

## 2014-03-12 ENCOUNTER — Emergency Department (HOSPITAL_COMMUNITY)
Admission: EM | Admit: 2014-03-12 | Discharge: 2014-03-12 | Disposition: A | Discharge status: Home / Self Care | Payer: Medicare Other | Attending: Emergency Medicine | Admitting: Emergency Medicine

## 2014-03-12 DIAGNOSIS — E119 Type 2 diabetes mellitus without complications: Secondary | ICD-10-CM | POA: Diagnosis not present

## 2014-03-12 DIAGNOSIS — I2510 Atherosclerotic heart disease of native coronary artery without angina pectoris: Secondary | ICD-10-CM

## 2014-03-12 DIAGNOSIS — Z87891 Personal history of nicotine dependence: Secondary | ICD-10-CM | POA: Insufficient documentation

## 2014-03-12 DIAGNOSIS — G8929 Other chronic pain: Secondary | ICD-10-CM | POA: Diagnosis not present

## 2014-03-12 DIAGNOSIS — Z8781 Personal history of (healed) traumatic fracture: Secondary | ICD-10-CM | POA: Insufficient documentation

## 2014-03-12 DIAGNOSIS — Z8673 Personal history of transient ischemic attack (TIA), and cerebral infarction without residual deficits: Secondary | ICD-10-CM | POA: Insufficient documentation

## 2014-03-12 DIAGNOSIS — Z8701 Personal history of pneumonia (recurrent): Secondary | ICD-10-CM | POA: Diagnosis not present

## 2014-03-12 DIAGNOSIS — E785 Hyperlipidemia, unspecified: Secondary | ICD-10-CM | POA: Insufficient documentation

## 2014-03-12 DIAGNOSIS — Z79899 Other long term (current) drug therapy: Secondary | ICD-10-CM | POA: Diagnosis not present

## 2014-03-12 DIAGNOSIS — Z87442 Personal history of urinary calculi: Secondary | ICD-10-CM | POA: Diagnosis not present

## 2014-03-12 DIAGNOSIS — M1990 Unspecified osteoarthritis, unspecified site: Secondary | ICD-10-CM | POA: Insufficient documentation

## 2014-03-12 DIAGNOSIS — Z7982 Long term (current) use of aspirin: Secondary | ICD-10-CM | POA: Insufficient documentation

## 2014-03-12 DIAGNOSIS — I7090 Unspecified atherosclerosis: Secondary | ICD-10-CM

## 2014-03-12 DIAGNOSIS — I10 Essential (primary) hypertension: Secondary | ICD-10-CM | POA: Diagnosis not present

## 2014-03-12 DIAGNOSIS — R0789 Other chest pain: Secondary | ICD-10-CM

## 2014-03-12 DIAGNOSIS — Z0130 Encounter for examination of blood pressure without abnormal findings: Secondary | ICD-10-CM

## 2014-03-12 DIAGNOSIS — Z136 Encounter for screening for cardiovascular disorders: Secondary | ICD-10-CM

## 2014-03-12 DIAGNOSIS — Z951 Presence of aortocoronary bypass graft: Secondary | ICD-10-CM | POA: Diagnosis not present

## 2014-03-12 LAB — URINALYSIS, ROUTINE W REFLEX MICROSCOPIC
Bilirubin Urine: NEGATIVE
GLUCOSE, UA: 250 mg/dL — AB
Hgb urine dipstick: NEGATIVE
Ketones, ur: NEGATIVE mg/dL
Leukocytes, UA: NEGATIVE
NITRITE: NEGATIVE
Protein, ur: NEGATIVE mg/dL
Specific Gravity, Urine: 1.01 (ref 1.005–1.030)
Urobilinogen, UA: 0.2 mg/dL (ref 0.0–1.0)
pH: 7 (ref 5.0–8.0)

## 2014-03-12 LAB — COMPREHENSIVE METABOLIC PANEL
ALBUMIN: 4.1 g/dL (ref 3.5–5.2)
ALT: 24 U/L (ref 0–53)
AST: 22 U/L (ref 0–37)
Alkaline Phosphatase: 88 U/L (ref 39–117)
Anion gap: 15 (ref 5–15)
BUN: 12 mg/dL (ref 6–23)
CALCIUM: 9.8 mg/dL (ref 8.4–10.5)
CO2: 24 mEq/L (ref 19–32)
Chloride: 96 mEq/L (ref 96–112)
Creatinine, Ser: 0.91 mg/dL (ref 0.50–1.35)
GFR calc Af Amer: 88 mL/min — ABNORMAL LOW (ref >90)
GFR calc non Af Amer: 76 mL/min — ABNORMAL LOW (ref >90)
Glucose, Bld: 210 mg/dL — ABNORMAL HIGH (ref 70–99)
Potassium: 4.4 mEq/L (ref 3.7–5.3)
Sodium: 135 mEq/L — ABNORMAL LOW (ref 137–147)
TOTAL PROTEIN: 7.6 g/dL (ref 6.0–8.3)
Total Bilirubin: 0.4 mg/dL (ref 0.3–1.2)

## 2014-03-12 LAB — TROPONIN I

## 2014-03-12 LAB — CBC WITH DIFFERENTIAL/PLATELET
BASOS ABS: 0 10*3/uL (ref 0.0–0.1)
BASOS PCT: 0 % (ref 0–1)
Eosinophils Absolute: 0.1 10*3/uL (ref 0.0–0.7)
Eosinophils Relative: 2 % (ref 0–5)
HCT: 42 % (ref 39.0–52.0)
Hemoglobin: 14.5 g/dL (ref 13.0–17.0)
Lymphocytes Relative: 17 % (ref 12–46)
Lymphs Abs: 1.1 10*3/uL (ref 0.7–4.0)
MCH: 32.2 pg (ref 26.0–34.0)
MCHC: 34.5 g/dL (ref 30.0–36.0)
MCV: 93.1 fL (ref 78.0–100.0)
MONO ABS: 0.6 10*3/uL (ref 0.1–1.0)
Monocytes Relative: 9 % (ref 3–12)
Neutro Abs: 4.5 10*3/uL (ref 1.7–7.7)
Neutrophils Relative %: 72 % (ref 43–77)
Platelets: 149 10*3/uL — ABNORMAL LOW (ref 150–400)
RBC: 4.51 MIL/uL (ref 4.22–5.81)
RDW: 13.2 % (ref 11.5–15.5)
WBC: 6.3 10*3/uL (ref 4.0–10.5)

## 2014-03-12 MED ORDER — SODIUM CHLORIDE 0.9 % IV BOLUS (SEPSIS)
500.0000 mL | Freq: Once | INTRAVENOUS | Status: AC
  Administered 2014-03-12: 500 mL via INTRAVENOUS

## 2014-03-12 NOTE — Discharge Instructions (Signed)
Hypertension Keep your appointment with the cardiologist on September 1. Return to the ED if you develop chest pain, shortness of breath or any other concerns. Hypertension, commonly called high blood pressure, is when the force of blood pumping through your arteries is too strong. Your arteries are the blood vessels that carry blood from your heart throughout your body. A blood pressure reading consists of a higher number over a lower number, such as 110/72. The higher number (systolic) is the pressure inside your arteries when your heart pumps. The lower number (diastolic) is the pressure inside your arteries when your heart relaxes. Ideally you want your blood pressure below 120/80. Hypertension forces your heart to work harder to pump blood. Your arteries may become narrow or stiff. Having hypertension puts you at risk for heart disease, stroke, and other problems.  RISK FACTORS Some risk factors for high blood pressure are controllable. Others are not.  Risk factors you cannot control include:   Race. You may be at higher risk if you are African American.  Age. Risk increases with age.  Gender. Men are at higher risk than women before age 46 years. After age 58, women are at higher risk than men. Risk factors you can control include:  Not getting enough exercise or physical activity.  Being overweight.  Getting too much fat, sugar, calories, or salt in your diet.  Drinking too much alcohol. SIGNS AND SYMPTOMS Hypertension does not usually cause signs or symptoms. Extremely high blood pressure (hypertensive crisis) may cause headache, anxiety, shortness of breath, and nosebleed. DIAGNOSIS  To check if you have hypertension, your health care provider will measure your blood pressure while you are seated, with your arm held at the level of your heart. It should be measured at least twice using the same arm. Certain conditions can cause a difference in blood pressure between your right and  left arms. A blood pressure reading that is higher than normal on one occasion does not mean that you need treatment. If one blood pressure reading is high, ask your health care provider about having it checked again. TREATMENT  Treating high blood pressure includes making lifestyle changes and possibly taking medicine. Living a healthy lifestyle can help lower high blood pressure. You may need to change some of your habits. Lifestyle changes may include:  Following the DASH diet. This diet is high in fruits, vegetables, and whole grains. It is low in salt, red meat, and added sugars.  Getting at least 2 hours of brisk physical activity every week.  Losing weight if necessary.  Not smoking.  Limiting alcoholic beverages.  Learning ways to reduce stress. If lifestyle changes are not enough to get your blood pressure under control, your health care provider may prescribe medicine. You may need to take more than one. Work closely with your health care provider to understand the risks and benefits. HOME CARE INSTRUCTIONS  Have your blood pressure rechecked as directed by your health care provider.   Take medicines only as directed by your health care provider. Follow the directions carefully. Blood pressure medicines must be taken as prescribed. The medicine does not work as well when you skip doses. Skipping doses also puts you at risk for problems.   Do not smoke.   Monitor your blood pressure at home as directed by your health care provider. SEEK MEDICAL CARE IF:   You think you are having a reaction to medicines taken.  You have recurrent headaches or feel dizzy.  You  have swelling in your ankles.  You have trouble with your vision. SEEK IMMEDIATE MEDICAL CARE IF:  You develop a severe headache or confusion.  You have unusual weakness, numbness, or feel faint.  You have severe chest or abdominal pain.  You vomit repeatedly.  You have trouble breathing. MAKE SURE  YOU:   Understand these instructions.  Will watch your condition.  Will get help right away if you are not doing well or get worse. Document Released: 06/30/2005 Document Revised: 11/14/2013 Document Reviewed: 04/22/2013 [facility] Patient Information 2015 [place], [place]. This information is not intended to replace advice given to you by your health care provider. Make sure you discuss any questions you have with your health care provider.

## 2014-03-12 NOTE — Consult Note (Signed)
Requesting physician: Dr. Wyvonnia Dusky  Reason for consultation: Hypotension and chest pain  History of Present Illness: 80 year old male with extensive cardiac history including coronary artery disease with CABGx4 with multiple stents, hypertension, history of stroke, BPH, chronic back pain, GERD, ischemic cardiomyopathy with EF of 45-50% as per last echo, positive stress test in November 2014 with plan on medical management was seen in the ED yesterday for chest pain that started yesterday morning. Patient reports eating some sandwich at [facility] and was also taking some hot sauce and they like he was having some substernal discomfort with burning. In the ED EKG was normal and one set of troponin was negative. The ED physician requested for admission on observation to rule out ACS but patient reported  being pain free and signed out AMA. He reported going to the drug store to have his blood pressure checked and was told that his blood pressure was low with systolic blood pressure of 89. He just came to the ED today.   Patient denies any chest pain since yesterday morning, denies shortness of breath, palpitations, dizziness, headache, blurred vision, nausea, vomiting, abdominal pain, bowel or urinary symptoms.  Course in the ED Patient's vitals were stable including blood pressure of 121/73 mmHg. Blood work was unremarkable. Troponin was negative . EKG showed sinus rhythm with unchanged ST depression in inferior and lateral leads. Hospitalist was consulted for evaluation and determining need for observation to rule out ACS  during my exam blood pressure was 171/81 mmHg. Patient denies any symptoms.   Allergies:  No Known Allergies    Past Medical History  Diagnosis Date  . Diabetes mellitus, type II   . Hyperlipidemia     Lipid profile in 02/2012:135, 227, 41, 49  . Coronary atherosclerosis of native coronary artery     a. CABG x 4 in 1989 (VG->OM1->OM2, VG->RCA, LIMA->LAD), b. 05/2010: DES  to VG-OM1/OM2, DES to distal LCx. c. NSTEMI in 04/2011 - TO distal LCX stent and VG->OM2. d. 02/2012 NSTEMI DES to VG-OM1/continuation to OM2 occluded. e. inferior STEMI s/p DES to SVG-RAMUS 06/2012. f. inferolat STEMI 09/2012 s/p DES to SVG-interm; g. Lex MV (11/14):  EF 35%, inf-lat scar with small peri-infarct ischemia  . Essential hypertension, benign   . Osteoarthritis   . History of stroke   . History of pneumonia   . Cervical vertebral fracture   . Chronic back pain   . Benign prostatic hypertrophy     History of urinary retention  . Peptic ulcer disease   . Gastroesophageal reflux disease   . Ischemic cardiomyopathy     LVEF 45-50%; Echo (04/2013):  mild LVH, EF 50%, Gr 1 DD, inf-lat HK, inf-septal and ant-lat HK, Tr MR, mild LAE    Past Surgical History  Procedure Laterality Date  . Tonsillectomy    . Coronary artery bypass graft      Medications:  Scheduled Meds: Continuous Infusions: PRN Meds:.    Social History:  reports that he quit smoking about 52 years ago. His smoking use included Cigarettes. He has a 20 pack-year smoking history. His smokeless tobacco use includes Chew. He reports that he does not drink alcohol or use illicit drugs.  Family History  Problem Relation Age of Onset  . Early death      Parents died young  . Appendicitis Mother     Pt was 50 year old  . Heart attack Father 30    Review of Systems:  As outlined in history of present illness  Physical Exam:  Filed Vitals:   03/12/14 1048 03/12/14 1049 03/12/14 1302 03/12/14 1500  BP:  121/73  166/81  Pulse:  79 73   Temp:  98.9 F (37.2 C)    TempSrc:  Oral    Resp: 18  10 18   Height: 5\' 10"  (1.778 m)     Weight: 81.647 kg (180 lb)     SpO2:  99% 99%     No intake or output data in the 24 hours ending 03/12/14 1555  General: Elderly male in no acute distress HEENT: No pallor, moist oral mucosa Heart: Regular rate and rhythm, without murmurs, rubs, gallops. Lungs: Clear to  auscultation bilaterally. Abdomen: Soft, nontender, nondistended, positive bowel sounds. Extremities: Warm, no edema Neuro: Alert and oriented  Labs on Admission:  CBC:    Component Value Date/Time   WBC 6.3 03/12/2014 1305   HGB 14.5 03/12/2014 1305   HCT 42.0 03/12/2014 1305   PLT 149* 03/12/2014 1305   MCV 93.1 03/12/2014 1305   NEUTROABS 4.5 03/12/2014 1305   LYMPHSABS 1.1 03/12/2014 1305   MONOABS 0.6 03/12/2014 1305   EOSABS 0.1 03/12/2014 1305   BASOSABS 0.0 03/12/2014 2774    Basic Metabolic Panel:    Component Value Date/Time   NA 135* 03/12/2014 1305   K 4.4 03/12/2014 1305   CL 96 03/12/2014 1305   CO2 24 03/12/2014 1305   BUN 12 03/12/2014 1305   CREATININE 0.91 03/12/2014 1305   CREATININE 1.19 04/27/2013 1140   GLUCOSE 210* 03/12/2014 1305   CALCIUM 9.8 03/12/2014 1305    Radiological Exams on Admission: Dg Chest Port 1 View  03/11/2014   CLINICAL DATA:  Chest pain. History of coronary stents and open heart surgery.  EXAM: PORTABLE CHEST - 1 VIEW  COMPARISON:  One-view chest 11/05/2013.  FINDINGS: The heart is mildly enlarged. Mild pulmonary vascular congestion is again noted. Postsurgical changes are evident. Minimal atelectasis or scarring is present at the left base.  IMPRESSION: 1. Borderline cardiomegaly and mild pulmonary vascular congestion. 2. Scarring or atelectasis at the left base.   Electronically Signed   By: Lawrence Santiago M.D.   On: 03/11/2014 17:13    Assessment/Plan Chest pain Patient reports burning substernal pain yesterday which subsided after coming to the ED. He did not have any EKG changes and one set of troponin was negative. He has not had further chest pain since yesterday. A repeat EKG and troponin done in the ED today was negative as well. Patient does have extensive cardiac history however his symptoms appear to be atypical and possibly related to heartburn. -Recommended to continue all medications and patient does not need hospitalization for ACS  rule out. He has appointment with his cardiologist in 2 days (03/14/2014) and I have instructed him to keep his appointment.  ? Hypotension Likely and erroneous reading as outpt. Patient's blood pressure both yesterday and today in the ED and recorded on several occasions have been normal to high. The size he does not have any other symptoms. Recommend to continue current home medications. Will followup with cardiology.  Ischemic cardiomyopathy Appears euvolemic. Continue Lasix, aspirin, coreg, lisinopril and statin  Diabetes mellitus Continue metformin  Extensive CAD with history of CABG and multiple stent Continue aspirin, brilinta, ranexa, coreg and statin  Plan discussed with patient and ED physician.  Time Spent on Admission: 40 minutes  Zabdiel Dripps 03/12/2014, 3:55 PM

## 2014-03-12 NOTE — ED Notes (Addendum)
Pt reports low bp this am. Pt alert and oriented. Pt denies any dizziness at this time. nad noted.

## 2014-03-12 NOTE — ED Provider Notes (Signed)
CSN: [id number]     Arrival date & time 03/12/14  1036 History  This chart was scribed for Ezequiel Essex, MD by Starleen Arms, ED Scribe. This patient was seen in room APA05/APA05 and the patient's care was started at 12:51 PM.   Chief Complaint  Patient presents with  . Hypotension   The history is provided by the patient. No language interpreter was used.    HPI Comments: Jeffrey [name] is a 80 y.o. male who presents to the Emergency Department complaining of a hypotensive episode onset this morning accompanied by weakness.  Patient states he measures his BP daily and states his systolic BP was 87 at approximately 10:00 am this morning.  He states his typical BP is 120/60.  Patient has a history of CABG and stenting and was seen yesterday for a complaint of CP that has since resolved. Patient denies light-headedness, dizziness, back pain, abdominal pain, CP, headache.    Past Medical History  Diagnosis Date  . Diabetes mellitus, type II   . Hyperlipidemia     Lipid profile in 02/2012:135, 227, 41, 49  . Coronary atherosclerosis of native coronary artery     a. CABG x 4 in 1989 (VG->OM1->OM2, VG->RCA, LIMA->LAD), b. 05/2010: DES to VG-OM1/OM2, DES to distal LCx. c. NSTEMI in 04/2011 - TO distal LCX stent and VG->OM2. d. 02/2012 NSTEMI DES to VG-OM1/continuation to OM2 occluded. e. inferior STEMI s/p DES to SVG-RAMUS 06/2012. f. inferolat STEMI 09/2012 s/p DES to SVG-interm; g. Lex MV (11/14):  EF 35%, inf-lat scar with small peri-infarct ischemia  . Essential hypertension, benign   . Osteoarthritis   . History of stroke   . History of pneumonia   . Cervical vertebral fracture   . Chronic back pain   . Benign prostatic hypertrophy     History of urinary retention  . Peptic ulcer disease   . Gastroesophageal reflux disease   . Ischemic cardiomyopathy     LVEF 45-50%; Echo (04/2013):  mild LVH, EF 50%, Gr 1 DD, inf-lat HK, inf-septal and ant-lat HK, Tr MR, mild LAE   Past Surgical  History  Procedure Laterality Date  . Tonsillectomy    . Coronary artery bypass graft     Family History  Problem Relation Age of Onset  . Early death      Parents died young  . Appendicitis Mother     Pt was 10 year old  . Heart attack Father 58   History  Substance Use Topics  . Smoking status: Former Smoker -- 2.00 packs/day for 10 years    Types: Cigarettes    Quit date: 07/14/1961  . Smokeless tobacco: Current User    Types: Chew  . Alcohol Use: No    Review of Systems  A complete 10 system review of systems was obtained and all systems are negative except as noted in the HPI and PMH.   Allergies  Review of patient's allergies indicates no known allergies.  Home Medications   Prior to Admission medications   Medication Sig Start Date End Date Taking? Authorizing Provider  albuterol (PROVENTIL HFA;VENTOLIN HFA) 108 (90 BASE) MCG/ACT inhaler Inhale 2 puffs into the lungs every 6 (six) hours as needed for wheezing or shortness of breath.   Yes Historical Provider, MD  aspirin 325 MG tablet Take 325 mg by mouth daily.   Yes Historical Provider, MD  atorvastatin (LIPITOR) 40 MG tablet Take 1 tablet (40 mg total) by mouth daily at 6 PM. 05/29/13  Yes Liliane Shi, PA-C  carvedilol (COREG) 3.125 MG tablet Take 3.125 mg by mouth 2 (two) times daily with a meal. 01/05/14  Yes Herminio Commons, MD  famotidine (PEPCID) 20 MG tablet Take 1 tablet (20 mg total) by mouth 2 (two) times daily. 09/01/13  Yes Samuella Cota, MD  furosemide (LASIX) 20 MG tablet Take 20 mg by mouth daily as needed for fluid. Take 1 tablet as needed if 3 lb weight gain. 05/06/13  Yes Lendon Colonel, NP  lisinopril (PRINIVIL,ZESTRIL) 2.5 MG tablet Take 1 tablet (2.5 mg total) by mouth daily. 09/23/13  Yes Lendon Colonel, NP  metFORMIN (GLUCOPHAGE) 500 MG tablet Take 500 mg by mouth 2 (two) times daily with a meal.   Yes Historical Provider, MD  Multiple Vitamin (MULTIVITAMIN WITH MINERALS) TABS  Take 1 tablet by mouth daily.   Yes Historical Provider, MD  nitroGLYCERIN (NITROSTAT) 0.4 MG SL tablet Place 0.4 mg under the tongue every 5 (five) minutes as needed for chest pain.   Yes Historical Provider, MD  potassium chloride SA (K-DUR,KLOR-CON) 20 MEQ tablet Take 20 mEq by mouth daily as needed (Takes only with Lasix). 04/28/13  Yes Lendon Colonel, NP  ranolazine (RANEXA) 500 MG 12 hr tablet Take 1 tablet (500 mg total) by mouth 2 (two) times daily. 11/01/13  Yes Herminio Commons, MD  Simethicone (MYLANTA GAS PO) Take by mouth daily as needed (indigestion/gas).   Yes Historical Provider, MD  tamsulosin (FLOMAX) 0.4 MG CAPS Take 0.4 mg by mouth daily after supper.  01/27/13  Yes Historical Provider, MD  ticagrelor (BRILINTA) 90 MG TABS tablet Take 1 tablet (90 mg total) by mouth 2 (two) times daily. 12/16/13  Yes Lendon Colonel, NP   BP 166/81  Pulse 73  Temp(Src) 98.9 F (37.2 C) (Oral)  Resp 18  Ht 5\' 10"  (1.778 m)  Wt 180 lb (81.647 kg)  BMI 25.83 kg/m2  SpO2 99% Physical Exam  Nursing note and vitals reviewed. Constitutional: He is oriented to person, place, and time. He appears well-developed and well-nourished. No distress.  Difficult and rambling historian.   HENT:  Head: Normocephalic and atraumatic.  Mouth/Throat: Oropharynx is clear and moist. No oropharyngeal exudate.  Eyes: Conjunctivae and EOM are normal. Pupils are equal, round, and reactive to light.  Neck: Normal range of motion. Neck supple.  No meningismus.  Cardiovascular: Normal rate, regular rhythm, normal heart sounds and intact distal pulses.   No murmur heard. Pulmonary/Chest: Effort normal and breath sounds normal. No respiratory distress. He has no wheezes. He has no rales.  Abdominal: Soft. There is no tenderness. There is no rebound and no guarding.  Musculoskeletal: Normal range of motion. He exhibits no edema and no tenderness.  Neurological: He is alert and oriented to person, place, and  time. No cranial nerve deficit. He exhibits normal muscle tone. Coordination normal.  No ataxia on finger to nose bilaterally. No pronator drift. 5/5 strength throughout. CN 2-12 intact. Negative Romberg. Equal grip strength. Sensation intact. Gait is normal.   Skin: Skin is warm.  Psychiatric: He has a normal mood and affect. His behavior is normal.    ED Course  Procedures (including critical care time)  DIAGNOSTIC STUDIES: Oxygen Saturation is 99% on RA, normal by my interpretation.    COORDINATION OF CARE:  1:00 PM Discussed treatment plan with patient at bedside.  Patient acknowledges and agrees with plan.    2:21 PM Upon recheck, patient's condition is  improved.  Informed patient of potential need to admit patient. He state's he would prefer to go home rather than be admitted.  However, he states he will follow medical advice.  Labs Review Labs Reviewed  CBC WITH DIFFERENTIAL - Abnormal; Notable for the following:    Platelets 149 (*)    All other components within normal limits  COMPREHENSIVE METABOLIC PANEL - Abnormal; Notable for the following:    Sodium 135 (*)    Glucose, Bld 210 (*)    GFR calc non Af Amer 76 (*)    GFR calc Af Amer 88 (*)    All other components within normal limits  URINALYSIS, ROUTINE W REFLEX MICROSCOPIC - Abnormal; Notable for the following:    Glucose, UA 250 (*)    All other components within normal limits  TROPONIN I    Imaging Review Dg Chest Port 1 View  03/11/2014   CLINICAL DATA:  Chest pain. History of coronary stents and open heart surgery.  EXAM: PORTABLE CHEST - 1 VIEW  COMPARISON:  One-view chest 11/05/2013.  FINDINGS: The heart is mildly enlarged. Mild pulmonary vascular congestion is again noted. Postsurgical changes are evident. Minimal atelectasis or scarring is present at the left base.  IMPRESSION: 1. Borderline cardiomegaly and mild pulmonary vascular congestion. 2. Scarring or atelectasis at the left base.   Electronically  Signed   By: Lawrence Santiago M.D.   On: 03/11/2014 17:13     EKG Interpretation   Date/Time:  Sunday March 12 2014 13:00:59 EDT Ventricular Rate:  72 PR Interval:  185 QRS Duration: 107 QT Interval:  443 QTC Calculation: 485 R Axis:   125 Text Interpretation:  Right and left arm electrode reversal,  interpretation assumes no reversal Sinus or ectopic atrial rhythm Probable  left atrial enlargement Right axis deviation Nonspecific repol  abnormality, diffuse leads Borderline prolonged QT interval Baseline  wander in lead(s) V6 ST depressions laterally  No significant change was  found Confirmed by [facility] [phone]) on 03/12/2014 1:12:32 PM      MDM   Final diagnoses:  Blood pressure check   Patient states he went to check his blood pressure and it was 86 systolic. He seen yesterday for chest pain and left AMA. He has an extensive cardiac history with multiple stents and bypass. extensive cardiac history including coronary artery disease with CABGx4 with multiple stents, hypertension, history of stroke, BPH, chronic back pain, GERD, ischemic cardiomyopathy with EF of 45-50% as per last echo, positive stress test in November 2014 with plan on medical management  States no further chest pain since ED visit yesterday.  Feels well now.  BP here normal to high. He story for CP yesterday seems atypical but he is a difficult historian. Suspect erroneous BP reading at pharmacy. EKG unchanged.  Troponin negative yesterday, negative today.  Labs at baseline.  PAtient with extensive cardiac history.  No syncope, dizziness, lightheadedness.  No further chest pain since yesterday. D/w Dr. Clementeen Graham who has seen patient and feels he is safe for discharge and does not require ACS rule out.  Patient has cardiology appointment in 2 days. Return precautions discussed.  BP 166/81  Pulse 73  Temp(Src) 98.9 F (37.2 C) (Oral)  Resp 18  Ht 5\' 10"  (1.778 m)  Wt 180 lb (81.647 kg)  BMI  25.83 kg/m2  SpO2 99%    I personally performed the services described in this documentation, which was scribed in my presence. The recorded information has  been reviewed and is accurate. Ezequiel Essex, MD 03/12/14 [phone]

## 2014-03-12 NOTE — ED Notes (Signed)
Pt goes up to the pharmacy to check his BP daily and when he went this am it was low so he came up here.  Pt appears in no distress.

## 2014-03-14 ENCOUNTER — Encounter: Payer: Self-pay | Admitting: Cardiovascular Disease

## 2014-03-14 ENCOUNTER — Ambulatory Visit (INDEPENDENT_AMBULATORY_CARE_PROVIDER_SITE_OTHER): Payer: Medicare Other | Admitting: Cardiovascular Disease

## 2014-03-14 VITALS — BP 128/78 | HR 75 | Ht 70.0 in | Wt 178.0 lb

## 2014-03-14 DIAGNOSIS — I25810 Atherosclerosis of coronary artery bypass graft(s) without angina pectoris: Secondary | ICD-10-CM

## 2014-03-14 DIAGNOSIS — E785 Hyperlipidemia, unspecified: Secondary | ICD-10-CM

## 2014-03-14 DIAGNOSIS — Z9289 Personal history of other medical treatment: Secondary | ICD-10-CM

## 2014-03-14 DIAGNOSIS — I2589 Other forms of chronic ischemic heart disease: Secondary | ICD-10-CM

## 2014-03-14 DIAGNOSIS — Z87898 Personal history of other specified conditions: Secondary | ICD-10-CM

## 2014-03-14 DIAGNOSIS — I951 Orthostatic hypotension: Secondary | ICD-10-CM

## 2014-03-14 DIAGNOSIS — I209 Angina pectoris, unspecified: Secondary | ICD-10-CM

## 2014-03-14 DIAGNOSIS — I10 Essential (primary) hypertension: Secondary | ICD-10-CM

## 2014-03-14 DIAGNOSIS — I25708 Atherosclerosis of coronary artery bypass graft(s), unspecified, with other forms of angina pectoris: Secondary | ICD-10-CM

## 2014-03-14 DIAGNOSIS — I2510 Atherosclerotic heart disease of native coronary artery without angina pectoris: Secondary | ICD-10-CM

## 2014-03-14 DIAGNOSIS — I255 Ischemic cardiomyopathy: Secondary | ICD-10-CM

## 2014-03-14 NOTE — Patient Instructions (Signed)
Your physician wants you to follow-up in: 4 months with Dr. Bronson Ing. You will receive a reminder letter in the mail two months in advance. If you don't receive a letter, please call our office to schedule the follow-up appointment.  Your physician recommends that you continue on your current medications as directed. Please refer to the Current Medication list given to you today.  Thank you for choosing [facility]!!

## 2014-03-14 NOTE — Progress Notes (Signed)
Patient ID: Jeffrey [name], male   DOB: May 06, 1929, 80 y.o.   MRN: [id number]      SUBJECTIVE: The patient presents for routine cardiovascular followup. He has a history of CAD with CABG with and multiple interventions, with most recent stress test from 05/29/2013 showing inferior lateral fixed defect with findings suspicious for a small amount of peri-infarct ischemia.  Most recent cardiac catheterization on 11/09/2012 showed patent LIMA to the LAD and patent SVG to a stented ramus intermedius, with occlusion of SVG's to obtuse marginal branches.  In 08/2013, he was admitted with dehydration secondary to C. Diff colitis and subsequently sustained a NSTEMI, likely due to demand ischemia from volume contraction and subsequent hypotension.  His most recent echocardiogram from 05/02/2013 revealed an EF of 50% with low normal LV systolic function.  He was evaluated on August 30 and the emergency room for chest pain and again on August 31 for hypotension. ECG demonstrated nonspecific ST segment and T wave abnormality in the inferolateral leads, markedly unchanged from an ECG performed in February 2015. Troponins were normal. Chest x-ray revealed a mild degree of pulmonary vascular congestion.  Today he feels well denying chest pain, palpitations and shortness of breath. Blood pressure is 128/78 with a heart rate of 75 beats per minute.  He takes lisinopril on occasion, with BP primarily regulated with Coreg.  Review of Systems: As per "subjective", otherwise negative.  No Known Allergies  Current Outpatient Prescriptions  Medication Sig Dispense Refill  . albuterol (PROVENTIL HFA;VENTOLIN HFA) 108 (90 BASE) MCG/ACT inhaler Inhale 2 puffs into the lungs every 6 (six) hours as needed for wheezing or shortness of breath.      Marland Kitchen aspirin 325 MG tablet Take 325 mg by mouth daily.      Marland Kitchen atorvastatin (LIPITOR) 40 MG tablet Take 1 tablet (40 mg total) by mouth daily at 6 PM.  30 tablet  11  . carvedilol  (COREG) 3.125 MG tablet Take 3.125 mg by mouth 2 (two) times daily with a meal.      . famotidine (PEPCID) 20 MG tablet Take 1 tablet (20 mg total) by mouth 2 (two) times daily.      . furosemide (LASIX) 20 MG tablet Take 20 mg by mouth daily as needed for fluid. Take 1 tablet as needed if 3 lb weight gain.      Marland Kitchen lisinopril (PRINIVIL,ZESTRIL) 2.5 MG tablet Take 1 tablet (2.5 mg total) by mouth daily.  30 tablet  6  . metFORMIN (GLUCOPHAGE) 500 MG tablet Take 500 mg by mouth 2 (two) times daily with a meal.      . Multiple Vitamin (MULTIVITAMIN WITH MINERALS) TABS Take 1 tablet by mouth daily.      . nitroGLYCERIN (NITROSTAT) 0.4 MG SL tablet Place 0.4 mg under the tongue every 5 (five) minutes as needed for chest pain.      . potassium chloride SA (K-DUR,KLOR-CON) 20 MEQ tablet Take 20 mEq by mouth daily as needed (Takes only with Lasix).      . ranolazine (RANEXA) 500 MG 12 hr tablet Take 1 tablet (500 mg total) by mouth 2 (two) times daily.  60 tablet  11  . Simethicone (MYLANTA GAS PO) Take by mouth daily as needed (indigestion/gas).      . tamsulosin (FLOMAX) 0.4 MG CAPS Take 0.4 mg by mouth daily after supper.       . ticagrelor (BRILINTA) 90 MG TABS tablet Take 1 tablet (90 mg total) by mouth 2 (  two) times daily.  60 tablet  6   No current facility-administered medications for this visit.    Past Medical History  Diagnosis Date  . Diabetes mellitus, type II   . Hyperlipidemia     Lipid profile in 02/2012:135, 227, 41, 49  . Coronary atherosclerosis of native coronary artery     a. CABG x 4 in 1989 (VG->OM1->OM2, VG->RCA, LIMA->LAD), b. 05/2010: DES to VG-OM1/OM2, DES to distal LCx. c. NSTEMI in 04/2011 - TO distal LCX stent and VG->OM2. d. 02/2012 NSTEMI DES to VG-OM1/continuation to OM2 occluded. e. inferior STEMI s/p DES to SVG-RAMUS 06/2012. f. inferolat STEMI 09/2012 s/p DES to SVG-interm; g. Lex MV (11/14):  EF 35%, inf-lat scar with small peri-infarct ischemia  . Essential  hypertension, benign   . Osteoarthritis   . History of stroke   . History of pneumonia   . Cervical vertebral fracture   . Chronic back pain   . Benign prostatic hypertrophy     History of urinary retention  . Peptic ulcer disease   . Gastroesophageal reflux disease   . Ischemic cardiomyopathy     LVEF 45-50%; Echo (04/2013):  mild LVH, EF 50%, Gr 1 DD, inf-lat HK, inf-septal and ant-lat HK, Tr MR, mild LAE    Past Surgical History  Procedure Laterality Date  . Tonsillectomy    . Coronary artery bypass graft      History   Social History  . Marital Status: Divorced    Spouse Name: N/A    Number of Children: N/A  . Years of Education: N/A   Occupational History  . Retired     Designer, television/film set   Social History Main Topics  . Smoking status: Former Smoker -- 2.00 packs/day for 10 years    Types: Cigarettes    Quit date: 07/14/1961  . Smokeless tobacco: Current User    Types: Chew  . Alcohol Use: No  . Drug Use: No  . Sexual Activity: No   Other Topics Concern  . Not on file   Social History Narrative   ** Merged History Encounter **...   He was raised by his uncle.  His mother deceased when   he was 25 year old with appendicitis. Father deceased from an MI at age   26.  He has 1 sister, but he does not know her health status as they   have been separated.            Filed Vitals:   03/14/14 1110  BP: 128/78  Pulse: 75  Height: 5\' 10"  (1.778 m)  Weight: 178 lb (80.74 kg)  SpO2: 95%    PHYSICAL EXAM General: NAD HEENT: Normal. Neck: No JVD, no thyromegaly. Lungs: Clear to auscultation bilaterally with normal respiratory effort. CV: Nondisplaced PMI.  Regular rate and rhythm, normal S1/S2, no S3/S4, no murmur. No pretibial or periankle edema.  No carotid bruit.  Normal pedal pulses.  Abdomen: Soft, nontender, no hepatosplenomegaly, no distention.  Neurologic: Alert and oriented x 3.  Psych: Normal affect. Skin: Normal. Musculoskeletal: Normal range of  motion, no gross deformities. Extremities: No clubbing or cyanosis.   ECG: Most recent ECG reviewed.      ASSESSMENT AND PLAN: 1. CAD: Stable ischemic heart disease. Continue current therapy with ASA, statin, Coreg, Ranexa, and Brilinta (given multiple coronary interventions). I encouraged him to reduce ASA to 81 mg daily but he prefers a higher dose as it relieves arthritic pain. Denies bleeding problems.  2. Essential HTN: Controlled with  Coreg. Occasionally takes lisinopril.  3. Hyperlipidemia: Continue Lipitor 40 mg daily. Lipid panel on 05/22/2013 demonstrated total cholesterol 169, triglycerides 2:30, HDL 33, LDL 90. I would consider increasing Lipitor to 80 mg daily to provide high intensity statin therapy.  4. Orthostatic hypotension: He has repeated documented episodes of this. I educated him on the importance of taking a few minutes to sit a the side of the bed in the morning before standing, and to so slowly at all times.  Dispo: f/u 4 months.  [name], M.D., F.A.C.C.

## 2014-03-27 ENCOUNTER — Emergency Department (HOSPITAL_COMMUNITY): Payer: Medicare Other

## 2014-03-27 ENCOUNTER — Encounter (HOSPITAL_COMMUNITY): Payer: Self-pay | Admitting: Emergency Medicine

## 2014-03-27 ENCOUNTER — Emergency Department (HOSPITAL_COMMUNITY)
Admission: EM | Admit: 2014-03-27 | Discharge: 2014-03-27 | Disposition: A | Discharge status: Home / Self Care | Payer: Medicare Other | Attending: Emergency Medicine | Admitting: Emergency Medicine

## 2014-03-27 DIAGNOSIS — Z23 Encounter for immunization: Secondary | ICD-10-CM | POA: Insufficient documentation

## 2014-03-27 DIAGNOSIS — Z79899 Other long term (current) drug therapy: Secondary | ICD-10-CM | POA: Insufficient documentation

## 2014-03-27 DIAGNOSIS — I10 Essential (primary) hypertension: Secondary | ICD-10-CM | POA: Diagnosis not present

## 2014-03-27 DIAGNOSIS — W010XXA Fall on same level from slipping, tripping and stumbling without subsequent striking against object, initial encounter: Secondary | ICD-10-CM | POA: Diagnosis not present

## 2014-03-27 DIAGNOSIS — IMO0002 Reserved for concepts with insufficient information to code with codable children: Secondary | ICD-10-CM | POA: Insufficient documentation

## 2014-03-27 DIAGNOSIS — M1990 Unspecified osteoarthritis, unspecified site: Secondary | ICD-10-CM | POA: Diagnosis not present

## 2014-03-27 DIAGNOSIS — Z87891 Personal history of nicotine dependence: Secondary | ICD-10-CM | POA: Diagnosis not present

## 2014-03-27 DIAGNOSIS — Z7902 Long term (current) use of antithrombotics/antiplatelets: Secondary | ICD-10-CM | POA: Insufficient documentation

## 2014-03-27 DIAGNOSIS — E785 Hyperlipidemia, unspecified: Secondary | ICD-10-CM | POA: Insufficient documentation

## 2014-03-27 DIAGNOSIS — Y9389 Activity, other specified: Secondary | ICD-10-CM | POA: Insufficient documentation

## 2014-03-27 DIAGNOSIS — Z8719 Personal history of other diseases of the digestive system: Secondary | ICD-10-CM | POA: Insufficient documentation

## 2014-03-27 DIAGNOSIS — Z951 Presence of aortocoronary bypass graft: Secondary | ICD-10-CM | POA: Diagnosis not present

## 2014-03-27 DIAGNOSIS — Y929 Unspecified place or not applicable: Secondary | ICD-10-CM | POA: Insufficient documentation

## 2014-03-27 DIAGNOSIS — Z8673 Personal history of transient ischemic attack (TIA), and cerebral infarction without residual deficits: Secondary | ICD-10-CM | POA: Diagnosis not present

## 2014-03-27 DIAGNOSIS — E119 Type 2 diabetes mellitus without complications: Secondary | ICD-10-CM | POA: Diagnosis not present

## 2014-03-27 DIAGNOSIS — Z872 Personal history of diseases of the skin and subcutaneous tissue: Secondary | ICD-10-CM | POA: Insufficient documentation

## 2014-03-27 DIAGNOSIS — S60511A Abrasion of right hand, initial encounter: Secondary | ICD-10-CM

## 2014-03-27 DIAGNOSIS — S20219A Contusion of unspecified front wall of thorax, initial encounter: Secondary | ICD-10-CM | POA: Diagnosis not present

## 2014-03-27 DIAGNOSIS — Z8781 Personal history of (healed) traumatic fracture: Secondary | ICD-10-CM | POA: Insufficient documentation

## 2014-03-27 DIAGNOSIS — S20211A Contusion of right front wall of thorax, initial encounter: Secondary | ICD-10-CM

## 2014-03-27 DIAGNOSIS — Z7982 Long term (current) use of aspirin: Secondary | ICD-10-CM | POA: Diagnosis not present

## 2014-03-27 DIAGNOSIS — Z87448 Personal history of other diseases of urinary system: Secondary | ICD-10-CM | POA: Insufficient documentation

## 2014-03-27 DIAGNOSIS — I2510 Atherosclerotic heart disease of native coronary artery without angina pectoris: Secondary | ICD-10-CM | POA: Insufficient documentation

## 2014-03-27 DIAGNOSIS — S6990XA Unspecified injury of unspecified wrist, hand and finger(s), initial encounter: Secondary | ICD-10-CM | POA: Insufficient documentation

## 2014-03-27 DIAGNOSIS — G8929 Other chronic pain: Secondary | ICD-10-CM | POA: Diagnosis not present

## 2014-03-27 DIAGNOSIS — W19XXXA Unspecified fall, initial encounter: Secondary | ICD-10-CM

## 2014-03-27 MED ORDER — TETANUS-DIPHTH-ACELL PERTUSSIS 5-2.5-18.5 LF-MCG/0.5 IM SUSP
0.5000 mL | Freq: Once | INTRAMUSCULAR | Status: AC
  Administered 2014-03-27: 0.5 mL via INTRAMUSCULAR
  Filled 2014-03-27: qty 0.5

## 2014-03-27 NOTE — ED Notes (Addendum)
Fall this am at bank, tripped on curb,  Pain ant chest,  "hurts to breathe",  And abrasion to rt hand.  No HI, alert,

## 2014-03-27 NOTE — ED Provider Notes (Signed)
CSN: [id number]     Arrival date & time 03/27/14  1125 History   First MD Initiated Contact with Patient 03/27/14 1256     Chief Complaint  Patient presents with  . Fall     (Consider location/radiation/quality/duration/timing/severity/associated sxs/prior Treatment) Patient is a 80 y.o. male presenting with fall.  Fall   80 year old male who tripped and fell on uneven pavement outside the bank today. He states that he had his right hand up against his chest and fell scraping the right hand and causing some pain to his right anterior chest. He was able to get up with assistance. He denies striking his head or injuring his neck. He has been ambulatory since the event. He states that he tripped on uneven pavement and had no loss of consciousness. He has pain in his right chest after the fall but it did not precede the fall. He has been in his usual state of good health and drove himself to the bank. He he then drove himself home and placed a Band-Aid over the hand area that was abraded and then drove himself to the hospital. He is unclear when he had his last tetanus shot. Past Medical History  Diagnosis Date  . Diabetes mellitus, type II   . Hyperlipidemia     Lipid profile in 02/2012:135, 227, 41, 49  . Coronary atherosclerosis of native coronary artery     a. CABG x 4 in 1989 (VG->OM1->OM2, VG->RCA, LIMA->LAD), b. 05/2010: DES to VG-OM1/OM2, DES to distal LCx. c. NSTEMI in 04/2011 - TO distal LCX stent and VG->OM2. d. 02/2012 NSTEMI DES to VG-OM1/continuation to OM2 occluded. e. inferior STEMI s/p DES to SVG-RAMUS 06/2012. f. inferolat STEMI 09/2012 s/p DES to SVG-interm; g. Lex MV (11/14):  EF 35%, inf-lat scar with small peri-infarct ischemia  . Essential hypertension, benign   . Osteoarthritis   . History of stroke   . History of pneumonia   . Cervical vertebral fracture   . Chronic back pain   . Benign prostatic hypertrophy     History of urinary retention  . Peptic ulcer disease   .  Gastroesophageal reflux disease   . Ischemic cardiomyopathy     LVEF 45-50%; Echo (04/2013):  mild LVH, EF 50%, Gr 1 DD, inf-lat HK, inf-septal and ant-lat HK, Tr MR, mild LAE   Past Surgical History  Procedure Laterality Date  . Tonsillectomy    . Coronary artery bypass graft     Family History  Problem Relation Age of Onset  . Early death      Parents died young  . Appendicitis Mother     Pt was 38 year old  . Heart attack Father 57   History  Substance Use Topics  . Smoking status: Former Smoker -- 2.00 packs/day for 10 years    Types: Cigarettes    Quit date: 07/14/1961  . Smokeless tobacco: Current User    Types: Chew  . Alcohol Use: No    Review of Systems  All other systems reviewed and are negative.     Allergies  Review of patient's allergies indicates no known allergies.  Home Medications   Prior to Admission medications   Medication Sig Start Date End Date Taking? Authorizing Provider  albuterol (PROVENTIL HFA;VENTOLIN HFA) 108 (90 BASE) MCG/ACT inhaler Inhale 2 puffs into the lungs every 6 (six) hours as needed for wheezing or shortness of breath.   Yes Historical Provider, MD  aspirin 325 MG tablet Take 325 mg by mouth  daily.   Yes Historical Provider, MD  atorvastatin (LIPITOR) 40 MG tablet Take 1 tablet (40 mg total) by mouth daily at 6 PM. 05/29/13  Yes Scott T Kathlen Mody, PA-C  carvedilol (COREG) 3.125 MG tablet Take 3.125 mg by mouth 2 (two) times daily with a meal. 01/05/14  Yes Herminio Commons, MD  famotidine (PEPCID) 20 MG tablet Take 1 tablet (20 mg total) by mouth 2 (two) times daily. 09/01/13  Yes Samuella Cota, MD  furosemide (LASIX) 20 MG tablet Take 20 mg by mouth daily as needed for fluid. Take 1 tablet as needed if 3 lb weight gain. 05/06/13  Yes Lendon Colonel, NP  lisinopril (PRINIVIL,ZESTRIL) 2.5 MG tablet Take 1 tablet (2.5 mg total) by mouth daily. 09/23/13  Yes Lendon Colonel, NP  metFORMIN (GLUCOPHAGE) 500 MG tablet Take 500  mg by mouth 2 (two) times daily with a meal.   Yes Historical Provider, MD  Multiple Vitamin (MULTIVITAMIN WITH MINERALS) TABS Take 1 tablet by mouth daily.   Yes Historical Provider, MD  nitroGLYCERIN (NITROSTAT) 0.4 MG SL tablet Place 0.4 mg under the tongue every 5 (five) minutes as needed for chest pain.   Yes Historical Provider, MD  potassium chloride SA (K-DUR,KLOR-CON) 20 MEQ tablet Take 20 mEq by mouth daily as needed (Takes only with Lasix). 04/28/13  Yes Lendon Colonel, NP  ranolazine (RANEXA) 500 MG 12 hr tablet Take 1 tablet (500 mg total) by mouth 2 (two) times daily. 11/01/13  Yes Herminio Commons, MD  Simethicone (MYLANTA GAS PO) Take by mouth daily as needed (indigestion/gas).   Yes Historical Provider, MD  tamsulosin (FLOMAX) 0.4 MG CAPS Take 0.4 mg by mouth daily after supper.  01/27/13  Yes Historical Provider, MD  ticagrelor (BRILINTA) 90 MG TABS tablet Take 1 tablet (90 mg total) by mouth 2 (two) times daily. 12/16/13  Yes Lendon Colonel, NP   BP 108/67  Pulse 79  Temp(Src) 97.5 F (36.4 C) (Oral)  Resp 14  Ht 5\' 10"  (1.778 m)  Wt 180 lb (81.647 kg)  BMI 25.83 kg/m2  SpO2 98% Physical Exam  Nursing note and vitals reviewed. Constitutional: He is oriented to person, place, and time. He appears well-developed and well-nourished.  HENT:  Head: Normocephalic and atraumatic.  Right Ear: External ear normal.  Left Ear: External ear normal.  Nose: Nose normal.  Mouth/Throat: Oropharynx is clear and moist.  Eyes: Conjunctivae and EOM are normal. Pupils are equal, round, and reactive to light.  Neck: Normal range of motion. Neck supple. No thyromegaly present.  Cardiovascular: Normal rate and regular rhythm.   Pulmonary/Chest: Effort normal and breath sounds normal.  Abdominal: Soft. Bowel sounds are normal.  Musculoskeletal:       Hands: Abrasion No tenderness palpation over her cervical, thoracic, or lumbar spine. Full active range of motion of all  extremities with no tenderness to palpation.  Neurological: He is alert and oriented to person, place, and time. He has normal reflexes. He displays normal reflexes. No cranial nerve deficit. Coordination normal.  Patient ambulatory here with normal gait  Skin: Skin is warm and dry.  Psychiatric: He has a normal mood and affect. His behavior is normal. Judgment and thought content normal.    ED Course  Procedures (including critical care time) Labs Review Labs Reviewed - No data to display  Imaging Review Dg Chest 2 View  03/27/2014   CLINICAL DATA:  Status post fall with sternal pain and right chest  pain  EXAM: CHEST  2 VIEW  COMPARISON:  March 11, 2014  FINDINGS: The heart size and mediastinal contours are stable. Patient is status post prior CABG and median sternotomy. The aorta is tortuous. Both lungs are clear. The visualized skeletal structures are unremarkable. The visualized ribs are unremarkable.  IMPRESSION: No active cardiopulmonary disease.   Electronically Signed   By: Abelardo Diesel M.D.   On: 03/27/2014 12:48     EKG Interpretation None      MDM   Final diagnoses:  Fall, initial encounter  Hand abrasion, right, initial encounter  Chest wall contusion, right, initial encounter       Shaune Pollack, MD 03/27/14 1601

## 2014-03-27 NOTE — ED Notes (Signed)
Pt has a small abrasion on bottom of palm on right nurse.

## 2014-03-27 NOTE — Discharge Instructions (Signed)
Abrasion An abrasion is a cut or scrape of the skin. Abrasions do not extend through all layers of the skin and most heal within 10 days. It is important to care for your abrasion properly to prevent infection. CAUSES  Most abrasions are caused by falling on, or gliding across, the ground or other surface. When your skin rubs on something, the outer and inner layer of skin rubs off, causing an abrasion. DIAGNOSIS  Your caregiver will be able to diagnose an abrasion during a physical exam.  TREATMENT  Your treatment depends on how large and deep the abrasion is. Generally, your abrasion will be cleaned with water and a mild soap to remove any dirt or debris. An antibiotic ointment may be put over the abrasion to prevent an infection. A bandage (dressing) may be wrapped around the abrasion to keep it from getting dirty.  You may need a tetanus shot if:  You cannot remember when you had your last tetanus shot.  You have never had a tetanus shot.  The injury broke your skin. If you get a tetanus shot, your arm may swell, get red, and feel warm to the touch. This is common and not a problem. If you need a tetanus shot and you choose not to have one, there is a rare chance of getting tetanus. Sickness from tetanus can be serious.  HOME CARE INSTRUCTIONS   If a dressing was applied, change it at least once a day or as directed by your caregiver. If the bandage sticks, soak it off with warm water.   Wash the area with water and a mild soap to remove all the ointment 2 times a day. Rinse off the soap and pat the area dry with a clean towel.   Reapply any ointment as directed by your caregiver. This will help prevent infection and keep the bandage from sticking. Use gauze over the wound and under the dressing to help keep the bandage from sticking.   Change your dressing right away if it becomes wet or dirty.   Only take over-the-counter or prescription medicines for pain, discomfort, or fever as  directed by your caregiver.   Follow up with your caregiver within 24-48 hours for a wound check, or as directed. If you were not given a wound-check appointment, look closely at your abrasion for redness, swelling, or pus. These are signs of infection. SEEK IMMEDIATE MEDICAL CARE IF:   You have increasing pain in the wound.   You have redness, swelling, or tenderness around the wound.   You have pus coming from the wound.   You have a fever or persistent symptoms for more than 2-3 days.  You have a fever and your symptoms suddenly get worse.  You have a bad smell coming from the wound or dressing.  MAKE SURE YOU:   Understand these instructions.  Will watch your condition.  Will get help right away if you are not doing well or get worse. Document Released: 04/09/2005 Document Revised: 06/16/2012 Document Reviewed: 06/03/2011 [facility] Patient Information 2015 [place], [place]. This information is not intended to replace advice given to you by your health care provider. Make sure you discuss any questions you have with your health care provider. Chest Contusion A chest contusion is a deep bruise on your chest area. Contusions are the result of an injury that caused bleeding under the skin. A chest contusion may involve bruising of the skin, muscles, or ribs. The contusion may turn blue, purple, or yellow.  Minor injuries will give you a painless contusion, but more severe contusions may stay painful and swollen for a few weeks. CAUSES  A contusion is usually caused by a blow, trauma, or direct force to an area of the body. SYMPTOMS   Swelling and redness of the injured area.  Discoloration of the injured area.  Tenderness and soreness of the injured area.  Pain. DIAGNOSIS  The diagnosis can be made by taking a history and performing a physical exam. An X-[place], CT scan, or MRI may be needed to determine if there were any associated injuries, such as broken bones (fractures) or  internal injuries. TREATMENT  Often, the best treatment for a chest contusion is resting, icing, and applying cold compresses to the injured area. Deep breathing exercises may be recommended to reduce the risk of pneumonia. Over-the-counter medicines may also be recommended for pain control. HOME CARE INSTRUCTIONS   Put ice on the injured area.  Put ice in a plastic bag.  Place a towel between your skin and the bag.  Leave the ice on for 15-20 minutes, 03-04 times a day.  Only take over-the-counter or prescription medicines as directed by your caregiver. Your caregiver may recommend avoiding anti-inflammatory medicines (aspirin, ibuprofen, and naproxen) for 48 hours because these medicines may increase bruising.  Rest the injured area.  Perform deep-breathing exercises as directed by your caregiver.  Stop smoking if you smoke.  Do not lift objects over 5 pounds (2.3 kg) for 3 days or longer if recommended by your caregiver. SEEK IMMEDIATE MEDICAL CARE IF:   You have increased bruising or swelling.  You have pain that is getting worse.  You have difficulty breathing.  You have dizziness, weakness, or fainting.  You have blood in your urine or stool.  You cough up or vomit blood.  Your swelling or pain is not relieved with medicines. MAKE SURE YOU:   Understand these instructions.  Will watch your condition.  Will get help right away if you are not doing well or get worse. Document Released: 03/25/2001 Document Revised: 03/24/2012 Document Reviewed: 12/22/2011 [facility] Patient Information 2015 [place], [place]. This information is not intended to replace advice given to you by your health care provider. Make sure you discuss any questions you have with your health care provider.

## 2014-04-18 ENCOUNTER — Encounter (HOSPITAL_COMMUNITY): Payer: Self-pay | Admitting: Emergency Medicine

## 2014-04-18 ENCOUNTER — Emergency Department (HOSPITAL_COMMUNITY)
Admission: EM | Admit: 2014-04-18 | Discharge: 2014-04-18 | Disposition: A | Discharge status: Home / Self Care | Payer: Medicare Other | Attending: Emergency Medicine | Admitting: Emergency Medicine

## 2014-04-18 DIAGNOSIS — Z87891 Personal history of nicotine dependence: Secondary | ICD-10-CM | POA: Insufficient documentation

## 2014-04-18 DIAGNOSIS — Z8781 Personal history of (healed) traumatic fracture: Secondary | ICD-10-CM | POA: Diagnosis not present

## 2014-04-18 DIAGNOSIS — Z9884 Bariatric surgery status: Secondary | ICD-10-CM | POA: Diagnosis not present

## 2014-04-18 DIAGNOSIS — E785 Hyperlipidemia, unspecified: Secondary | ICD-10-CM | POA: Insufficient documentation

## 2014-04-18 DIAGNOSIS — I10 Essential (primary) hypertension: Secondary | ICD-10-CM | POA: Diagnosis not present

## 2014-04-18 DIAGNOSIS — I2510 Atherosclerotic heart disease of native coronary artery without angina pectoris: Secondary | ICD-10-CM | POA: Diagnosis not present

## 2014-04-18 DIAGNOSIS — Z8701 Personal history of pneumonia (recurrent): Secondary | ICD-10-CM | POA: Insufficient documentation

## 2014-04-18 DIAGNOSIS — R51 Headache: Secondary | ICD-10-CM | POA: Diagnosis present

## 2014-04-18 DIAGNOSIS — E119 Type 2 diabetes mellitus without complications: Secondary | ICD-10-CM | POA: Insufficient documentation

## 2014-04-18 DIAGNOSIS — Z8673 Personal history of transient ischemic attack (TIA), and cerebral infarction without residual deficits: Secondary | ICD-10-CM | POA: Insufficient documentation

## 2014-04-18 DIAGNOSIS — N400 Enlarged prostate without lower urinary tract symptoms: Secondary | ICD-10-CM | POA: Diagnosis not present

## 2014-04-18 DIAGNOSIS — Z79899 Other long term (current) drug therapy: Secondary | ICD-10-CM | POA: Diagnosis not present

## 2014-04-18 DIAGNOSIS — Z7982 Long term (current) use of aspirin: Secondary | ICD-10-CM | POA: Insufficient documentation

## 2014-04-18 LAB — BASIC METABOLIC PANEL
Anion gap: 12 (ref 5–15)
BUN: 14 mg/dL (ref 6–23)
CALCIUM: 9.4 mg/dL (ref 8.4–10.5)
CHLORIDE: 97 meq/L (ref 96–112)
CO2: 25 meq/L (ref 19–32)
Creatinine, Ser: 0.9 mg/dL (ref 0.50–1.35)
GFR calc Af Amer: 89 mL/min — ABNORMAL LOW (ref >90)
GFR calc non Af Amer: 77 mL/min — ABNORMAL LOW (ref >90)
Glucose, Bld: 198 mg/dL — ABNORMAL HIGH (ref 70–99)
Potassium: 4 mEq/L (ref 3.7–5.3)
SODIUM: 134 meq/L — AB (ref 137–147)

## 2014-04-18 LAB — URINALYSIS, ROUTINE W REFLEX MICROSCOPIC
Bilirubin Urine: NEGATIVE
Glucose, UA: 100 mg/dL — AB
Hgb urine dipstick: NEGATIVE
KETONES UR: NEGATIVE mg/dL
LEUKOCYTES UA: NEGATIVE
NITRITE: NEGATIVE
PH: 7.5 (ref 5.0–8.0)
Protein, ur: NEGATIVE mg/dL
SPECIFIC GRAVITY, URINE: 1.01 (ref 1.005–1.030)
Urobilinogen, UA: 0.2 mg/dL (ref 0.0–1.0)

## 2014-04-18 LAB — CBC WITH DIFFERENTIAL/PLATELET
BASOS PCT: 0 % (ref 0–1)
Basophils Absolute: 0 10*3/uL (ref 0.0–0.1)
Eosinophils Absolute: 0.3 10*3/uL (ref 0.0–0.7)
Eosinophils Relative: 4 % (ref 0–5)
HCT: 38.8 % — ABNORMAL LOW (ref 39.0–52.0)
Hemoglobin: 13.5 g/dL (ref 13.0–17.0)
LYMPHS PCT: 27 % (ref 12–46)
Lymphs Abs: 2 10*3/uL (ref 0.7–4.0)
MCH: 32.5 pg (ref 26.0–34.0)
MCHC: 34.8 g/dL (ref 30.0–36.0)
MCV: 93.3 fL (ref 78.0–100.0)
Monocytes Absolute: 0.6 10*3/uL (ref 0.1–1.0)
Monocytes Relative: 8 % (ref 3–12)
NEUTROS PCT: 61 % (ref 43–77)
Neutro Abs: 4.4 10*3/uL (ref 1.7–7.7)
PLATELETS: 155 10*3/uL (ref 150–400)
RBC: 4.16 MIL/uL — ABNORMAL LOW (ref 4.22–5.81)
RDW: 12.7 % (ref 11.5–15.5)
WBC: 7.3 10*3/uL (ref 4.0–10.5)

## 2014-04-18 NOTE — ED Notes (Signed)
Pt alert & oriented x4, stable gait. Patient given discharge instructions, paperwork & prescription(s). Patient  instructed to stop at the registration desk to finish any additional paperwork. Patient verbalized understanding. Pt left department w/ no further questions. 

## 2014-04-18 NOTE — ED Notes (Signed)
Pt states he started having a headache a few hours ago. Denies any pain at this time. Pt ambulated back to room w/ no complications. Pt states he did take 1 81mg  ASA w/ his nightly meds before coming to the ER.

## 2014-04-18 NOTE — ED Notes (Signed)
Patient states that he took pepto bismol for indigestion earlier and it helped. States that he is no longer having a headache and patient also denies chest pain.

## 2014-04-18 NOTE — ED Notes (Signed)
Felt real hot today like I was running a fever. States that he feels like the top of his head is coming off. Head started hurting 2-3 hours ago per pt. Denies dizziness, denies any visual changes.

## 2014-04-18 NOTE — ED Provider Notes (Signed)
CSN: [id number]     Arrival date & time 04/18/14  1835 History   First MD Initiated Contact with Patient 04/18/14 2117     Chief Complaint  Patient presents with  . Headache     (Consider location/radiation/quality/duration/timing/severity/associated sxs/prior Treatment) Patient is a 80 y.o. male presenting with headaches. The history is provided by the patient (the pt has had a headache and he states his bp has been up).  Headache Pain location:  Generalized Quality:  Dull Radiates to:  Does not radiate Severity currently:  5/10 Severity at highest:  7/10 Onset quality:  Gradual Timing:  Intermittent Progression:  Waxing and waning Chronicity:  Recurrent Similar to prior headaches: yes   Associated symptoms: no abdominal pain, no back pain, no congestion, no cough, no diarrhea, no fatigue, no seizures and no sinus pressure     Past Medical History  Diagnosis Date  . Diabetes mellitus, type II   . Hyperlipidemia     Lipid profile in 02/2012:135, 227, 41, 49  . Coronary atherosclerosis of native coronary artery     a. CABG x 4 in 1989 (VG->OM1->OM2, VG->RCA, LIMA->LAD), b. 05/2010: DES to VG-OM1/OM2, DES to distal LCx. c. NSTEMI in 04/2011 - TO distal LCX stent and VG->OM2. d. 02/2012 NSTEMI DES to VG-OM1/continuation to OM2 occluded. e. inferior STEMI s/p DES to SVG-RAMUS 06/2012. f. inferolat STEMI 09/2012 s/p DES to SVG-interm; g. Lex MV (11/14):  EF 35%, inf-lat scar with small peri-infarct ischemia  . Essential hypertension, benign   . Osteoarthritis   . History of stroke   . History of pneumonia   . Cervical vertebral fracture   . Chronic back pain   . Benign prostatic hypertrophy     History of urinary retention  . Peptic ulcer disease   . Gastroesophageal reflux disease   . Ischemic cardiomyopathy     LVEF 45-50%; Echo (04/2013):  mild LVH, EF 50%, Gr 1 DD, inf-lat HK, inf-septal and ant-lat HK, Tr MR, mild LAE   Past Surgical History  Procedure Laterality Date  .  Tonsillectomy    . Coronary artery bypass graft     Family History  Problem Relation Age of Onset  . Early death      Parents died young  . Appendicitis Mother     Pt was 80 year old  . Heart attack Father 68   History  Substance Use Topics  . Smoking status: Former Smoker -- 2.00 packs/day for 10 years    Types: Cigarettes    Quit date: 07/14/1961  . Smokeless tobacco: Current User    Types: Chew  . Alcohol Use: No    Review of Systems  Constitutional: Negative for appetite change and fatigue.  HENT: Negative for congestion, ear discharge and sinus pressure.   Eyes: Negative for discharge.  Respiratory: Negative for cough.   Cardiovascular: Negative for chest pain.  Gastrointestinal: Negative for abdominal pain and diarrhea.  Genitourinary: Negative for frequency and hematuria.  Musculoskeletal: Negative for back pain.  Skin: Negative for rash.  Neurological: Positive for headaches. Negative for seizures.  Psychiatric/Behavioral: Negative for hallucinations.      Allergies  Review of patient's allergies indicates no known allergies.  Home Medications   Prior to Admission medications   Medication Sig Start Date End Date Taking? Authorizing Provider  albuterol (PROVENTIL HFA;VENTOLIN HFA) 108 (90 BASE) MCG/ACT inhaler Inhale 2 puffs into the lungs every 6 (six) hours as needed for wheezing or shortness of breath.    Historical  Provider, MD  aspirin 325 MG tablet Take 325 mg by mouth daily.    Historical Provider, MD  atorvastatin (LIPITOR) 40 MG tablet Take 1 tablet (40 mg total) by mouth daily at 6 PM. 05/29/13   Liliane Shi, PA-C  carvedilol (COREG) 3.125 MG tablet Take 3.125 mg by mouth 2 (two) times daily with a meal. 01/05/14   Herminio Commons, MD  famotidine (PEPCID) 20 MG tablet Take 1 tablet (20 mg total) by mouth 2 (two) times daily. 09/01/13   Samuella Cota, MD  furosemide (LASIX) 20 MG tablet Take 20 mg by mouth daily as needed for fluid. Take 1  tablet as needed if 3 lb weight gain. 05/06/13   Lendon Colonel, NP  lisinopril (PRINIVIL,ZESTRIL) 2.5 MG tablet Take 1 tablet (2.5 mg total) by mouth daily. 09/23/13   Lendon Colonel, NP  metFORMIN (GLUCOPHAGE) 500 MG tablet Take 500 mg by mouth 2 (two) times daily with a meal.    Historical Provider, MD  Multiple Vitamin (MULTIVITAMIN WITH MINERALS) TABS Take 1 tablet by mouth daily.    Historical Provider, MD  nitroGLYCERIN (NITROSTAT) 0.4 MG SL tablet Place 0.4 mg under the tongue every 5 (five) minutes as needed for chest pain.    Historical Provider, MD  potassium chloride SA (K-DUR,KLOR-CON) 20 MEQ tablet Take 20 mEq by mouth daily as needed (Takes only with Lasix). 04/28/13   Lendon Colonel, NP  ranolazine (RANEXA) 500 MG 12 hr tablet Take 1 tablet (500 mg total) by mouth 2 (two) times daily. 11/01/13   Herminio Commons, MD  Simethicone (MYLANTA GAS PO) Take by mouth daily as needed (indigestion/gas).    Historical Provider, MD  tamsulosin (FLOMAX) 0.4 MG CAPS Take 0.4 mg by mouth daily after supper.  01/27/13   Historical Provider, MD  ticagrelor (BRILINTA) 90 MG TABS tablet Take 1 tablet (90 mg total) by mouth 2 (two) times daily. 12/16/13   Lendon Colonel, NP   BP 152/93  Pulse 80  Temp(Src) 98.9 F (37.2 C) (Oral)  Resp 18  Ht 5\' 10"  (1.778 m)  Wt 180 lb (81.647 kg)  BMI 25.83 kg/m2  SpO2 99% Physical Exam  Constitutional: He is oriented to person, place, and time. He appears well-developed.  HENT:  Head: Normocephalic.  Eyes: Conjunctivae and EOM are normal. No scleral icterus.  Neck: Neck supple. No thyromegaly present.  Cardiovascular: Normal rate and regular rhythm.  Exam reveals no gallop and no friction rub.   No murmur heard. Pulmonary/Chest: No stridor. He has no wheezes. He has no rales. He exhibits no tenderness.  Abdominal: He exhibits no distension. There is no tenderness. There is no rebound.  Musculoskeletal: Normal range of motion. He exhibits no  edema.  Lymphadenopathy:    He has no cervical adenopathy.  Neurological: He is oriented to person, place, and time. He exhibits normal muscle tone. Coordination normal.  Skin: No rash noted. No erythema.  Psychiatric: He has a normal mood and affect. His behavior is normal.    ED Course  Procedures (including critical care time) Labs Review Labs Reviewed  CBC WITH DIFFERENTIAL - Abnormal; Notable for the following:    RBC 4.16 (*)    HCT 38.8 (*)    All other components within normal limits  BASIC METABOLIC PANEL - Abnormal; Notable for the following:    Sodium 134 (*)    Glucose, Bld 198 (*)    GFR calc non Af Amer 77 (*)  GFR calc Af Amer 89 (*)    All other components within normal limits  URINALYSIS, ROUTINE W REFLEX MICROSCOPIC - Abnormal; Notable for the following:    Glucose, UA 100 (*)    All other components within normal limits    Imaging Review No results found.   EKG Interpretation None      MDM   Final diagnoses:  Essential hypertension    The chart was scribed for me under my direct supervision.  I personally performed the history, physical, and medical decision making and all procedures in the evaluation of this patient.Maudry Diego, MD 04/18/14 [phone]

## 2014-04-18 NOTE — Discharge Instructions (Signed)
Follow up with your md as needed °

## 2014-05-03 ENCOUNTER — Telehealth: Payer: Self-pay | Admitting: *Deleted

## 2014-05-03 NOTE — Telephone Encounter (Signed)
What specifically have BP's been? At his office visit, he was taking lisinopril occasionally and not daily. BP had been primarily regulated with Coreg.

## 2014-05-03 NOTE — Telephone Encounter (Signed)
Will forward to Dr. Bronson Ing before refilling incase of med change

## 2014-05-03 NOTE — Telephone Encounter (Signed)
Pt states that his lisinopril needs to be increased to two pills a day instead of one because his BP has been running high. Pt has been taking two a day and has run out/tmh

## 2014-05-03 NOTE — Telephone Encounter (Signed)
Pt BP this AM was 180/80 HR 80. Says he has been taking lisinopril in the morning and at night. Also has been taking ASA 81 not 325mg  updated medication list. Told pt I would call him back after OK from Dr. Bronson Ing on refill of lisinopril BID

## 2014-05-03 NOTE — Telephone Encounter (Signed)
Pt walked in and asked for BP check which was 140/68 HR 78. Pt is concerned about BP denies pain or any other symptoms and wants refills on lisinopril since he has been taking BID instead of QD as was directed. Pt has enough through tomorrow. Will forward to Dr. Bronson Ing.

## 2014-05-04 ENCOUNTER — Other Ambulatory Visit: Payer: Self-pay | Admitting: Cardiovascular Disease

## 2014-05-04 MED ORDER — LISINOPRIL 2.5 MG PO TABS: ORAL_TABLET | ORAL | Status: DC

## 2014-05-04 NOTE — Telephone Encounter (Signed)
Refilled lisinopril, pt aware.

## 2014-05-04 NOTE — Telephone Encounter (Signed)
Ok to refill lisinopril.

## 2014-05-06 ENCOUNTER — Emergency Department (HOSPITAL_COMMUNITY): Payer: Medicare Other

## 2014-05-06 ENCOUNTER — Encounter (HOSPITAL_COMMUNITY): Payer: Self-pay | Admitting: Emergency Medicine

## 2014-05-06 ENCOUNTER — Emergency Department (HOSPITAL_COMMUNITY)
Admission: EM | Admit: 2014-05-06 | Discharge: 2014-05-06 | Disposition: A | Discharge status: Home / Self Care | Payer: Medicare Other | Attending: Emergency Medicine | Admitting: Emergency Medicine

## 2014-05-06 DIAGNOSIS — R1032 Left lower quadrant pain: Secondary | ICD-10-CM | POA: Insufficient documentation

## 2014-05-06 DIAGNOSIS — Z7982 Long term (current) use of aspirin: Secondary | ICD-10-CM | POA: Diagnosis not present

## 2014-05-06 DIAGNOSIS — Z87891 Personal history of nicotine dependence: Secondary | ICD-10-CM | POA: Diagnosis not present

## 2014-05-06 DIAGNOSIS — Z8701 Personal history of pneumonia (recurrent): Secondary | ICD-10-CM | POA: Diagnosis not present

## 2014-05-06 DIAGNOSIS — I10 Essential (primary) hypertension: Secondary | ICD-10-CM | POA: Insufficient documentation

## 2014-05-06 DIAGNOSIS — G8929 Other chronic pain: Secondary | ICD-10-CM | POA: Insufficient documentation

## 2014-05-06 DIAGNOSIS — Z79899 Other long term (current) drug therapy: Secondary | ICD-10-CM | POA: Insufficient documentation

## 2014-05-06 DIAGNOSIS — E119 Type 2 diabetes mellitus without complications: Secondary | ICD-10-CM | POA: Insufficient documentation

## 2014-05-06 DIAGNOSIS — Z8673 Personal history of transient ischemic attack (TIA), and cerebral infarction without residual deficits: Secondary | ICD-10-CM | POA: Insufficient documentation

## 2014-05-06 DIAGNOSIS — K219 Gastro-esophageal reflux disease without esophagitis: Secondary | ICD-10-CM | POA: Insufficient documentation

## 2014-05-06 DIAGNOSIS — M1990 Unspecified osteoarthritis, unspecified site: Secondary | ICD-10-CM | POA: Insufficient documentation

## 2014-05-06 DIAGNOSIS — I2510 Atherosclerotic heart disease of native coronary artery without angina pectoris: Secondary | ICD-10-CM | POA: Diagnosis not present

## 2014-05-06 DIAGNOSIS — Z951 Presence of aortocoronary bypass graft: Secondary | ICD-10-CM | POA: Diagnosis not present

## 2014-05-06 DIAGNOSIS — R109 Unspecified abdominal pain: Secondary | ICD-10-CM | POA: Diagnosis present

## 2014-05-06 LAB — CBG MONITORING, ED: Glucose-Capillary: 161 mg/dL — ABNORMAL HIGH (ref 70–99)

## 2014-05-06 NOTE — ED Notes (Signed)
Patient c/o right flank pain that started this morning after eating breakfast. Patient reports "feeling like there was a knot with swelling in right upper flank." Patient reports having BM in which he felt better afterwards.

## 2014-05-06 NOTE — ED Notes (Signed)
MD at bedside. 

## 2014-05-06 NOTE — ED Provider Notes (Signed)
CSN: [id number]     Arrival date & time 05/06/14  1031 History  This chart was scribed for Jeffrey Christen, MD by Marlowe Kays, ED Scribe. This patient was seen in room APA03/APA03 and the patient's care was started at 3:20 PM.  Chief Complaint  Patient presents with  . Flank Pain   HPI HPI Comments:  Jeffrey [name] is a 80 y.o. male who presents to the Emergency Department complaining of right midlateral abdominal pain that started several hours ago after breakfast this morning. He reports taking a hard, swollen area without pain of the abdomen. He reports taking a stool softener and having a bowel movement yesterday and today. He denies urinary symptoms or constipation. He has no complaints of pain at this time. No fever, chills, dysuria.   Past Medical History  Diagnosis Date  . Diabetes mellitus, type II   . Hyperlipidemia     Lipid profile in 02/2012:135, 227, 41, 49  . Coronary atherosclerosis of native coronary artery     a. CABG x 4 in 1989 (VG->OM1->OM2, VG->RCA, LIMA->LAD), b. 05/2010: DES to VG-OM1/OM2, DES to distal LCx. c. NSTEMI in 04/2011 - TO distal LCX stent and VG->OM2. d. 02/2012 NSTEMI DES to VG-OM1/continuation to OM2 occluded. e. inferior STEMI s/p DES to SVG-RAMUS 06/2012. f. inferolat STEMI 09/2012 s/p DES to SVG-interm; g. Lex MV (11/14):  EF 35%, inf-lat scar with small peri-infarct ischemia  . Essential hypertension, benign   . Osteoarthritis   . History of stroke   . History of pneumonia   . Cervical vertebral fracture   . Chronic back pain   . Benign prostatic hypertrophy     History of urinary retention  . Peptic ulcer disease   . Gastroesophageal reflux disease   . Ischemic cardiomyopathy     LVEF 45-50%; Echo (04/2013):  mild LVH, EF 50%, Gr 1 DD, inf-lat HK, inf-septal and ant-lat HK, Tr MR, mild LAE   Past Surgical History  Procedure Laterality Date  . Tonsillectomy    . Coronary artery bypass graft     Family History  Problem Relation Age of  Onset  . Early death      Parents died young  . Appendicitis Mother     Pt was 66 year old  . Heart attack Father 48   History  Substance Use Topics  . Smoking status: Former Smoker -- 2.00 packs/day for 10 years    Types: Cigarettes    Quit date: 07/14/1961  . Smokeless tobacco: Current User    Types: Chew  . Alcohol Use: No    Review of Systems  All other systems reviewed and are negative.   Allergies  Review of patient's allergies indicates no known allergies.  Home Medications   Prior to Admission medications   Medication Sig Start Date End Date Taking? Authorizing Provider  albuterol (PROVENTIL HFA;VENTOLIN HFA) 108 (90 BASE) MCG/ACT inhaler Inhale 2 puffs into the lungs every 6 (six) hours as needed for wheezing or shortness of breath.   Yes Historical Provider, MD  aspirin 81 MG tablet Take 81 mg by mouth daily.   Yes Historical Provider, MD  atorvastatin (LIPITOR) 40 MG tablet Take 1 tablet (40 mg total) by mouth daily at 6 PM. 05/29/13  Yes Scott T Kathlen Mody, PA-C  bismuth subsalicylate (PEPTO BISMOL) 262 MG/15ML suspension Take 30 mLs by mouth every 6 (six) hours as needed for indigestion.   Yes Historical Provider, MD  busPIRone (BUSPAR) 5 MG tablet Take 5 mg  by mouth 2 (two) times daily.   Yes Historical Provider, MD  carvedilol (COREG) 3.125 MG tablet Take 3.125 mg by mouth 2 (two) times daily with a meal.   Yes Historical Provider, MD  lisinopril (PRINIVIL,ZESTRIL) 2.5 MG tablet Take 2.5 mg by mouth 2 (two) times daily. 05/04/14  Yes Herminio Commons, MD  metFORMIN (GLUCOPHAGE) 500 MG tablet Take 1,000 mg by mouth 2 (two) times daily with a meal.    Yes Historical Provider, MD  Multiple Vitamin (MULTIVITAMIN WITH MINERALS) TABS Take 1 tablet by mouth daily.   Yes Historical Provider, MD  pantoprazole (PROTONIX) 40 MG tablet Take 40 mg by mouth daily.   Yes Historical Provider, MD  ranolazine (RANEXA) 500 MG 12 hr tablet Take 1 tablet (500 mg total) by mouth 2 (two)  times daily. 11/01/13  Yes Herminio Commons, MD  tamsulosin (FLOMAX) 0.4 MG CAPS Take 0.4 mg by mouth daily after supper.  01/27/13  Yes Historical Provider, MD  ticagrelor (BRILINTA) 90 MG TABS tablet Take 1 tablet (90 mg total) by mouth 2 (two) times daily. 12/16/13  Yes Lendon Colonel, NP  furosemide (LASIX) 20 MG tablet Take 20 mg by mouth daily as needed for fluid. Take 1 tablet as needed if 3 lb weight gain. 05/06/13   Lendon Colonel, NP  nitroGLYCERIN (NITROSTAT) 0.4 MG SL tablet Place 0.4 mg under the tongue every 5 (five) minutes as needed for chest pain.    Historical Provider, MD  potassium chloride SA (K-DUR,KLOR-CON) 20 MEQ tablet Take 20 mEq by mouth daily as needed (Takes only with Lasix). 04/28/13   Lendon Colonel, NP   Triage Vitals: BP 141/82  Pulse 80  Temp(Src) 98.6 F (37 C) (Oral)  Resp 16  Ht 5\' 10"  (1.778 m)  Wt 180 lb (81.647 kg)  BMI 25.83 kg/m2  SpO2 99% Physical Exam  Nursing note and vitals reviewed. Constitutional: He is oriented to person, place, and time. He appears well-developed and well-nourished.  HENT:  Head: Normocephalic and atraumatic.  Eyes: Conjunctivae and EOM are normal. Pupils are equal, round, and reactive to light.  Neck: Normal range of motion. Neck supple.  Cardiovascular: Normal rate, regular rhythm and normal heart sounds.   Pulmonary/Chest: Effort normal and breath sounds normal.  Abdominal: Soft. Bowel sounds are normal. He exhibits no distension. There is no tenderness.  Musculoskeletal: Normal range of motion.  Neurological: He is alert and oriented to person, place, and time.  Skin: Skin is warm and dry.  Psychiatric: He has a normal mood and affect. His behavior is normal.    ED Course  Procedures (including critical care time) DIAGNOSTIC STUDIES: Oxygen Saturation is 99% on RA, normal by my interpretation.   COORDINATION OF CARE: 3:23 PM- Will X-Ray abdomen. Pt verbalizes understanding and agrees to  plan.  Medications - No data to display  Labs Review Labs Reviewed  CBG MONITORING, ED - Abnormal; Notable for the following:    Glucose-Capillary 161 (*)    All other components within normal limits    Imaging Review Dg Abd Acute W/chest  05/06/2014   CLINICAL DATA:  Right lower quadrant pain.  EXAM: ACUTE ABDOMEN SERIES (ABDOMEN 2 VIEW & CHEST 1 VIEW)  COMPARISON:  Chest radiograph 03/27/2014 and abdominal radiograph 11/28/2012  FINDINGS: There are changes of median sternotomy for CABG. The superior most median sternotomy wire is fractured. Coronary artery stent material is also noted. Heart size is stable and appears within normal limits. The  pulmonary vascularity is normal and in the lungs are clear. There is a moderate amount of stool throughout the colon. The bowel gas pattern is nonobstructive. There is a convex-left scoliotic curvature of the lumbar spine that appears stable. Negative for free intraperitoneal air.  IMPRESSION: 1. Nonobstructive bowel gas pattern. Moderate amount of stool in the colon. 2. No acute cardiopulmonary disease. Prior median sternotomy for CABG.   Electronically Signed   By: Curlene Dolphin M.D.   On: 05/06/2014 17:03     EKG Interpretation None      MDM   Final diagnoses:  Abdominal pain, LLQ    No acute abdomen. Acute abdominal series shows a moderate amount of stool. Suspect patient may have mild constipation. He is in no acute distress.  I personally performed the services described in this documentation, which was scribed in my presence. The recorded information has been reviewed and is accurate.    Jeffrey Christen, MD 05/06/14 [phone]

## 2014-05-06 NOTE — Discharge Instructions (Signed)
X-ray shows a moderate amount of stool in the colon. Increase fluid, fruit, fiber. Take Miralax or magnesium citrate for constipation issue.

## 2014-05-14 DIAGNOSIS — I255 Ischemic cardiomyopathy: Secondary | ICD-10-CM

## 2014-05-14 HISTORY — DX: Ischemic cardiomyopathy: I25.5

## 2014-05-18 ENCOUNTER — Emergency Department (HOSPITAL_COMMUNITY): Payer: Medicare Other

## 2014-05-18 ENCOUNTER — Inpatient Hospital Stay (HOSPITAL_COMMUNITY)
Admission: EM | Admit: 2014-05-18 | Discharge: 2014-05-21 | DRG: 287 | Disposition: A | Discharge status: Home / Self Care | Payer: Medicare Other | Attending: Cardiology | Admitting: Cardiology

## 2014-05-18 ENCOUNTER — Encounter (HOSPITAL_COMMUNITY): Payer: Self-pay

## 2014-05-18 DIAGNOSIS — M1990 Unspecified osteoarthritis, unspecified site: Secondary | ICD-10-CM | POA: Diagnosis not present

## 2014-05-18 DIAGNOSIS — K219 Gastro-esophageal reflux disease without esophagitis: Secondary | ICD-10-CM | POA: Diagnosis not present

## 2014-05-18 DIAGNOSIS — I255 Ischemic cardiomyopathy: Secondary | ICD-10-CM

## 2014-05-18 DIAGNOSIS — I679 Cerebrovascular disease, unspecified: Secondary | ICD-10-CM | POA: Diagnosis present

## 2014-05-18 DIAGNOSIS — N400 Enlarged prostate without lower urinary tract symptoms: Secondary | ICD-10-CM | POA: Diagnosis present

## 2014-05-18 DIAGNOSIS — I25110 Atherosclerotic heart disease of native coronary artery with unstable angina pectoris: Principal | ICD-10-CM | POA: Diagnosis present

## 2014-05-18 DIAGNOSIS — I252 Old myocardial infarction: Secondary | ICD-10-CM | POA: Diagnosis not present

## 2014-05-18 DIAGNOSIS — K279 Peptic ulcer, site unspecified, unspecified as acute or chronic, without hemorrhage or perforation: Secondary | ICD-10-CM | POA: Diagnosis present

## 2014-05-18 DIAGNOSIS — R079 Chest pain, unspecified: Secondary | ICD-10-CM | POA: Diagnosis not present

## 2014-05-18 DIAGNOSIS — Z8673 Personal history of transient ischemic attack (TIA), and cerebral infarction without residual deficits: Secondary | ICD-10-CM

## 2014-05-18 DIAGNOSIS — I200 Unstable angina: Secondary | ICD-10-CM | POA: Diagnosis present

## 2014-05-18 DIAGNOSIS — I2510 Atherosclerotic heart disease of native coronary artery without angina pectoris: Secondary | ICD-10-CM

## 2014-05-18 DIAGNOSIS — I209 Angina pectoris, unspecified: Secondary | ICD-10-CM

## 2014-05-18 DIAGNOSIS — E785 Hyperlipidemia, unspecified: Secondary | ICD-10-CM

## 2014-05-18 DIAGNOSIS — Z8249 Family history of ischemic heart disease and other diseases of the circulatory system: Secondary | ICD-10-CM

## 2014-05-18 DIAGNOSIS — F17220 Nicotine dependence, chewing tobacco, uncomplicated: Secondary | ICD-10-CM | POA: Diagnosis present

## 2014-05-18 DIAGNOSIS — E119 Type 2 diabetes mellitus without complications: Secondary | ICD-10-CM

## 2014-05-18 DIAGNOSIS — I10 Essential (primary) hypertension: Secondary | ICD-10-CM | POA: Diagnosis not present

## 2014-05-18 DIAGNOSIS — R0789 Other chest pain: Secondary | ICD-10-CM

## 2014-05-18 DIAGNOSIS — E118 Type 2 diabetes mellitus with unspecified complications: Secondary | ICD-10-CM

## 2014-05-18 LAB — CBC WITH DIFFERENTIAL/PLATELET
BASOS ABS: 0 10*3/uL (ref 0.0–0.1)
BASOS PCT: 0 % (ref 0–1)
EOS ABS: 0.3 10*3/uL (ref 0.0–0.7)
EOS PCT: 3 % (ref 0–5)
HCT: 40.4 % (ref 39.0–52.0)
Hemoglobin: 13.5 g/dL (ref 13.0–17.0)
Lymphocytes Relative: 21 % (ref 12–46)
Lymphs Abs: 1.8 10*3/uL (ref 0.7–4.0)
MCH: 31.5 pg (ref 26.0–34.0)
MCHC: 33.4 g/dL (ref 30.0–36.0)
MCV: 94.4 fL (ref 78.0–100.0)
Monocytes Absolute: 0.7 10*3/uL (ref 0.1–1.0)
Monocytes Relative: 8 % (ref 3–12)
Neutro Abs: 5.8 10*3/uL (ref 1.7–7.7)
Neutrophils Relative %: 68 % (ref 43–77)
PLATELETS: 177 10*3/uL (ref 150–400)
RBC: 4.28 MIL/uL (ref 4.22–5.81)
RDW: 12.8 % (ref 11.5–15.5)
WBC: 8.6 10*3/uL (ref 4.0–10.5)

## 2014-05-18 LAB — COMPREHENSIVE METABOLIC PANEL
ALBUMIN: 4 g/dL (ref 3.5–5.2)
ALK PHOS: 83 U/L (ref 39–117)
ALT: 27 U/L (ref 0–53)
AST: 28 U/L (ref 0–37)
Anion gap: 13 (ref 5–15)
BUN: 19 mg/dL (ref 6–23)
CALCIUM: 9.9 mg/dL (ref 8.4–10.5)
CO2: 26 mEq/L (ref 19–32)
Chloride: 99 mEq/L (ref 96–112)
Creatinine, Ser: 1.15 mg/dL (ref 0.50–1.35)
GFR calc Af Amer: 66 mL/min — ABNORMAL LOW (ref >90)
GFR calc non Af Amer: 57 mL/min — ABNORMAL LOW (ref >90)
Glucose, Bld: 175 mg/dL — ABNORMAL HIGH (ref 70–99)
POTASSIUM: 4.2 meq/L (ref 3.7–5.3)
SODIUM: 138 meq/L (ref 137–147)
TOTAL PROTEIN: 7.3 g/dL (ref 6.0–8.3)
Total Bilirubin: 0.4 mg/dL (ref 0.3–1.2)

## 2014-05-18 LAB — TROPONIN I: Troponin I: <0.3 ng/mL (ref <0.30)

## 2014-05-18 MED ORDER — ATORVASTATIN CALCIUM 40 MG PO TABS
40.0000 mg | ORAL_TABLET | Freq: Every day | ORAL | Status: DC
  Administered 2014-05-19 – 2014-05-20 (×2): 40 mg via ORAL
  Filled 2014-05-18 (×3): qty 1

## 2014-05-18 MED ORDER — NITROGLYCERIN 0.4 MG SL SUBL: 0.4000 mg | SUBLINGUAL_TABLET | SUBLINGUAL | Status: DC | PRN

## 2014-05-18 MED ORDER — TAMSULOSIN HCL 0.4 MG PO CAPS
0.4000 mg | ORAL_CAPSULE | Freq: Every day | ORAL | Status: DC
  Administered 2014-05-19 – 2014-05-20 (×2): 0.4 mg via ORAL
  Filled 2014-05-18 (×3): qty 1

## 2014-05-18 MED ORDER — ASPIRIN EC 325 MG PO TBEC
325.0000 mg | DELAYED_RELEASE_TABLET | Freq: Every day | ORAL | Status: DC
  Filled 2014-05-18: qty 1

## 2014-05-18 MED ORDER — ALBUTEROL SULFATE HFA 108 (90 BASE) MCG/ACT IN AERS: 2.0000 | INHALATION_SPRAY | Freq: Four times a day (QID) | RESPIRATORY_TRACT | Status: DC | PRN

## 2014-05-18 MED ORDER — SODIUM CHLORIDE 0.9 % IV BOLUS (SEPSIS)
500.0000 mL | Freq: Once | INTRAVENOUS | Status: AC
Start: 2014-05-18 — End: 2014-05-18
  Administered 2014-05-18: 500 mL via INTRAVENOUS

## 2014-05-18 MED ORDER — CARVEDILOL 3.125 MG PO TABS
3.1250 mg | ORAL_TABLET | Freq: Two times a day (BID) | ORAL | Status: DC
  Administered 2014-05-19: 3.125 mg via ORAL
  Filled 2014-05-18 (×3): qty 1

## 2014-05-18 MED ORDER — PANTOPRAZOLE SODIUM 40 MG PO TBEC
40.0000 mg | DELAYED_RELEASE_TABLET | Freq: Every day | ORAL | Status: DC
  Administered 2014-05-19 – 2014-05-20 (×2): 40 mg via ORAL
  Filled 2014-05-18 (×3): qty 1

## 2014-05-18 MED ORDER — LISINOPRIL 2.5 MG PO TABS
2.5000 mg | ORAL_TABLET | Freq: Two times a day (BID) | ORAL | Status: DC
  Administered 2014-05-19 – 2014-05-21 (×5): 2.5 mg via ORAL
  Filled 2014-05-18 (×8): qty 1

## 2014-05-18 MED ORDER — POTASSIUM CHLORIDE CRYS ER 20 MEQ PO TBCR
20.0000 meq | EXTENDED_RELEASE_TABLET | Freq: Every day | ORAL | Status: DC | PRN
Start: 2014-05-18 — End: 2014-05-19

## 2014-05-18 MED ORDER — INSULIN ASPART 100 UNIT/ML ~~LOC~~ SOLN
0.0000 [IU] | SUBCUTANEOUS | Status: DC
  Administered 2014-05-19: 3 [IU] via SUBCUTANEOUS
  Administered 2014-05-19: 2 [IU] via SUBCUTANEOUS
  Administered 2014-05-19: 3 [IU] via SUBCUTANEOUS
  Administered 2014-05-19 – 2014-05-20 (×2): 2 [IU] via SUBCUTANEOUS
  Administered 2014-05-20 – 2014-05-21 (×3): 3 [IU] via SUBCUTANEOUS
  Administered 2014-05-21: 2 [IU] via SUBCUTANEOUS

## 2014-05-18 MED ORDER — HEPARIN SODIUM (PORCINE) 5000 UNIT/ML IJ SOLN: 5000.0000 [IU] | Freq: Three times a day (TID) | INTRAMUSCULAR | Status: DC

## 2014-05-18 MED ORDER — TICAGRELOR 90 MG PO TABS
90.0000 mg | ORAL_TABLET | Freq: Two times a day (BID) | ORAL | Status: DC
  Administered 2014-05-19 – 2014-05-21 (×6): 90 mg via ORAL
  Filled 2014-05-18 (×8): qty 1

## 2014-05-18 MED ORDER — BUSPIRONE HCL 5 MG PO TABS
5.0000 mg | ORAL_TABLET | Freq: Two times a day (BID) | ORAL | Status: DC
  Administered 2014-05-19 – 2014-05-21 (×6): 5 mg via ORAL
  Filled 2014-05-18 (×11): qty 1

## 2014-05-18 MED ORDER — MORPHINE SULFATE 2 MG/ML IJ SOLN: 2.0000 mg | INTRAMUSCULAR | Status: DC | PRN

## 2014-05-18 MED ORDER — RANOLAZINE ER 500 MG PO TB12
500.0000 mg | ORAL_TABLET | Freq: Two times a day (BID) | ORAL | Status: DC
  Administered 2014-05-19: 500 mg via ORAL
  Filled 2014-05-18 (×7): qty 1

## 2014-05-18 MED ORDER — ADULT MULTIVITAMIN W/MINERALS CH
1.0000 | ORAL_TABLET | Freq: Every day | ORAL | Status: DC
Start: 2014-05-19 — End: 2014-05-21
  Administered 2014-05-19 – 2014-05-21 (×3): 1 via ORAL
  Filled 2014-05-18 (×3): qty 1

## 2014-05-18 MED ORDER — SODIUM CHLORIDE 0.9 % IV SOLN: INTRAVENOUS | Status: DC

## 2014-05-18 MED ORDER — HYDROCODONE-ACETAMINOPHEN 5-325 MG PO TABS
1.0000 | ORAL_TABLET | Freq: Once | ORAL | Status: AC
  Administered 2014-05-18: 1 via ORAL
  Filled 2014-05-18: qty 1

## 2014-05-18 MED ORDER — SODIUM CHLORIDE 0.9 % IJ SOLN
3.0000 mL | Freq: Two times a day (BID) | INTRAMUSCULAR | Status: DC
  Administered 2014-05-19 – 2014-05-21 (×4): 3 mL via INTRAVENOUS

## 2014-05-18 MED ORDER — FUROSEMIDE 20 MG PO TABS
20.0000 mg | ORAL_TABLET | Freq: Every day | ORAL | Status: DC | PRN
  Filled 2014-05-18: qty 1

## 2014-05-18 NOTE — ED Notes (Signed)
Patient complaining of chest pain. Took 2 nitro without relief. Patient is pale. Denies nausea, vomiting, or diaphoresis.

## 2014-05-18 NOTE — ED Provider Notes (Signed)
CSN: [id number]     Arrival date & time 05/18/14  1700 History  This chart was scribed for Maudry Diego, MD by Edison Simon, ED Scribe. This patient was seen in room APA11/APA11 and the patient's care was started at 5:48 PM.    Chief Complaint  Patient presents with  . Chest Pain   Patient is a 80 y.o. male presenting with chest pain. The history is provided by the patient (patient complains of chest pain). No language interpreter was used.  Chest Pain Pain location:  Epigastric Pain radiates to:  Does not radiate Pain radiates to the back: no   Pain severity:  Severe Onset quality:  Sudden Timing:  Constant Progression:  Improving Chronicity:  New Relieved by:  Nothing Worsened by:  Nothing tried Ineffective treatments:  Nitroglycerin Associated symptoms: diaphoresis and weakness   Associated symptoms: no abdominal pain, no back pain, no cough, no fatigue, no headache, no nausea and not vomiting   Risk factors: diabetes mellitus, high cholesterol, hypertension and male sex     HPI Comments: Jeffrey [name] is a 80 y.o. male with history of coronary atherosclerosis, diabetes, hypertension, and hyperlipidemia who presents to the Emergency Department complaining of chest pain with onset earlier today. He states he had 2 doses of nitroglycerin PTA but states he is unsure if it improved his symptoms, though he states his pain is somewhat better now. He reports associated diaphoresis and weakness, which he notes began after taking nitroglycerin. He denies nausea or vomiting. He also reports history of GERD.  Past Medical History  Diagnosis Date  . Diabetes mellitus, type II   . Hyperlipidemia     Lipid profile in 02/2012:135, 227, 41, 49  . Coronary atherosclerosis of native coronary artery     a. CABG x 4 in 1989 (VG->OM1->OM2, VG->RCA, LIMA->LAD), b. 05/2010: DES to VG-OM1/OM2, DES to distal LCx. c. NSTEMI in 04/2011 - TO distal LCX stent and VG->OM2. d. 02/2012 NSTEMI DES to  VG-OM1/continuation to OM2 occluded. e. inferior STEMI s/p DES to SVG-RAMUS 06/2012. f. inferolat STEMI 09/2012 s/p DES to SVG-interm; g. Lex MV (11/14):  EF 35%, inf-lat scar with small peri-infarct ischemia  . Essential hypertension, benign   . Osteoarthritis   . History of stroke   . History of pneumonia   . Cervical vertebral fracture   . Chronic back pain   . Benign prostatic hypertrophy     History of urinary retention  . Peptic ulcer disease   . Gastroesophageal reflux disease   . Ischemic cardiomyopathy     LVEF 45-50%; Echo (04/2013):  mild LVH, EF 50%, Gr 1 DD, inf-lat HK, inf-septal and ant-lat HK, Tr MR, mild LAE   Past Surgical History  Procedure Laterality Date  . Tonsillectomy    . Coronary artery bypass graft     Family History  Problem Relation Age of Onset  . Early death      Parents died young  . Appendicitis Mother     Pt was 28 year old  . Heart attack Father 59   History  Substance Use Topics  . Smoking status: Former Smoker -- 2.00 packs/day for 10 years    Types: Cigarettes    Quit date: 07/14/1961  . Smokeless tobacco: Current User    Types: Chew  . Alcohol Use: No    Review of Systems  Constitutional: Positive for diaphoresis. Negative for appetite change and fatigue.  HENT: Negative for congestion, ear discharge and sinus pressure.  Eyes: Negative for discharge.  Respiratory: Negative for cough.   Cardiovascular: Positive for chest pain.  Gastrointestinal: Negative for nausea, vomiting, abdominal pain and diarrhea.  Genitourinary: Negative for frequency and hematuria.  Musculoskeletal: Negative for back pain.  Skin: Negative for rash.  Neurological: Positive for weakness. Negative for seizures and headaches.  Psychiatric/Behavioral: Negative for hallucinations.      Allergies  Review of patient's allergies indicates no known allergies.  Home Medications   Prior to Admission medications   Medication Sig Start Date End Date Taking?  Authorizing Provider  albuterol (PROVENTIL HFA;VENTOLIN HFA) 108 (90 BASE) MCG/ACT inhaler Inhale 2 puffs into the lungs every 6 (six) hours as needed for wheezing or shortness of breath.    Historical Provider, MD  aspirin 81 MG tablet Take 81 mg by mouth daily.    Historical Provider, MD  atorvastatin (LIPITOR) 40 MG tablet Take 1 tablet (40 mg total) by mouth daily at 6 PM. 05/29/13   Liliane Shi, PA-C  bismuth subsalicylate (PEPTO BISMOL) 262 MG/15ML suspension Take 30 mLs by mouth every 6 (six) hours as needed for indigestion.    Historical Provider, MD  busPIRone (BUSPAR) 5 MG tablet Take 5 mg by mouth 2 (two) times daily.    Historical Provider, MD  carvedilol (COREG) 3.125 MG tablet Take 3.125 mg by mouth 2 (two) times daily with a meal.    Historical Provider, MD  furosemide (LASIX) 20 MG tablet Take 20 mg by mouth daily as needed for fluid. Take 1 tablet as needed if 3 lb weight gain. 05/06/13   Lendon Colonel, NP  lisinopril (PRINIVIL,ZESTRIL) 2.5 MG tablet Take 2.5 mg by mouth 2 (two) times daily. 05/04/14   Herminio Commons, MD  metFORMIN (GLUCOPHAGE) 500 MG tablet Take 1,000 mg by mouth 2 (two) times daily with a meal.     Historical Provider, MD  Multiple Vitamin (MULTIVITAMIN WITH MINERALS) TABS Take 1 tablet by mouth daily.    Historical Provider, MD  nitroGLYCERIN (NITROSTAT) 0.4 MG SL tablet Place 0.4 mg under the tongue every 5 (five) minutes as needed for chest pain.    Historical Provider, MD  pantoprazole (PROTONIX) 40 MG tablet Take 40 mg by mouth daily.    Historical Provider, MD  potassium chloride SA (K-DUR,KLOR-CON) 20 MEQ tablet Take 20 mEq by mouth daily as needed (Takes only with Lasix). 04/28/13   Lendon Colonel, NP  ranolazine (RANEXA) 500 MG 12 hr tablet Take 1 tablet (500 mg total) by mouth 2 (two) times daily. 11/01/13   Herminio Commons, MD  tamsulosin (FLOMAX) 0.4 MG CAPS Take 0.4 mg by mouth daily after supper.  01/27/13   Historical Provider, MD   ticagrelor (BRILINTA) 90 MG TABS tablet Take 1 tablet (90 mg total) by mouth 2 (two) times daily. 12/16/13   Lendon Colonel, NP   BP 98/62 mmHg  Pulse 86  Temp(Src) 97.4 F (36.3 C) (Oral)  Resp 19  Ht 5\' 10"  (1.778 m)  Wt 180 lb (81.647 kg)  BMI 25.83 kg/m2  SpO2 96% Physical Exam  Constitutional: He is oriented to person, place, and time. He appears well-developed.  HENT:  Head: Normocephalic.  Eyes: Conjunctivae and EOM are normal. No scleral icterus.  Neck: Neck supple. No thyromegaly present.  Cardiovascular: Normal rate and regular rhythm.  Exam reveals no gallop and no friction rub.   No murmur heard. Pulmonary/Chest: No stridor. He has no wheezes. He has no rales. He exhibits no tenderness.  Abdominal: He exhibits no distension. There is tenderness (mild epigastric). There is no rebound.  Musculoskeletal: Normal range of motion. He exhibits no edema.  Lymphadenopathy:    He has no cervical adenopathy.  Neurological: He is oriented to person, place, and time. He exhibits normal muscle tone. Coordination normal.  Skin: No rash noted. No erythema.  Psychiatric: He has a normal mood and affect. His behavior is normal.  Nursing note and vitals reviewed.   ED Course  Procedures (including critical care time)  DIAGNOSTIC STUDIES: Oxygen Saturation is 96% on room air, normal by my interpretation.    COORDINATION OF CARE: 5:52 PM Discussed treatment plan with patient at beside, the patient agrees with the plan and has no further questions at this time.   Labs Review Labs Reviewed - No data to display  Imaging Review No results found.   EKG Interpretation None      MDM   Final diagnoses:  None    The chart was scribed for me under my direct supervision.  I personally performed the history, physical, and medical decision making and all procedures in the evaluation of this patient.Maudry Diego, MD 05/18/14 [phone]

## 2014-05-18 NOTE — H&P (Signed)
Hospitalist Admission History and Physical  Patient name: Jeffrey [name] Medical record number: [id number] Date of birth: 1929-06-19 Age: 80 y.o. Gender: male  Primary Care Provider: [name], MD  Chief Complaint: chest pain   History of Present Illness:This is a 80 y.o. year old male with significant past medical history of CAD s/p CABG, type 2 DM, HLD  presenting with chest pain. Patient has a baseline history of a sense of coronary artery disease status post 4 vessel CABG greater than 20 years ago with multiple stents per patient. States he has chronic recurrent episodes of chest pain and epigastric discomfort. Has been seen several times in the ER for this in the past. Patient states he's had some substernal discomfort he thought was associated with gas over the past 24 hours. States he's had milder symptoms over the past 3-4 weeks. Patient states the symptoms became off moderate earlier today. He took some Pepto-Bismol as well as 2 nitroglycerin with mild to moderate improvement in symptoms. Symptoms persisted and he proceeded to come to the ER. Patient states that upon arrival to the ER he had severe crushing substernal chest pain with associated nausea and diaphoresis. Patient does not specify overall duration of symptoms. States the symptoms come and go on a regular basis. Patient thought symptoms related to reflux. Has been compliant with his PPI. Denies any recent NSAID use. No shortness of breath. Present to the ER temperature 97.8, heart rate 7680s, respirations intense 20s, blood pressure in the 90s to 120s systolic. Satting greater than 93% on room air. CBC and be met within normal limits. Troponins negative 2.chest x-ray shows no acute cardiopulmonary disease. Initial EKG showing shows persistent T-wave inversions in the anterior lateral as well as some inferior leads.repeat EKG shows similar pattern though somewhat improved. Patient currently denies any significant chest pain  though states this is a chronic issue.  Assessment and Plan: Jeffrey [name] is a 80 y.o. year old male presenting with chest pain  Active Problems:   Chest pain   1-chest pain/CAD -Symptoms somewhat mixed though predominantly atypical in patient with known extensive cardiovascular disease -Discuss case with Dr. Mayford Knife with cardiology. Had a fairly lengthy discussion about overall history. EKG pattern is fairly similar though inconsistent to prior EKGs. No active significant chest pain currently, though patient does not clarify with these chest pain-free. -Most recent cath was one year ago(April 2014) with noted severe 3 vessel obstructive coronary disease with patent saphenous vein graft to the ramus intermediate branch pain LIMA graft to the LAD and occluded saphenous vein graft to obtuse marginal and good ventricular function. -preliminary plan nothing by mouth after midnight with likely nuclear stress test -Patient to be admitted to cardiology service -Appreciate Dr. Norris Cross help with this fairly complex patient -Full dose aspirin, when necessary nitroglycerin -Continue Brillinta and Ranexa -cont home regimen  2-diabetes -Sliding scale insulin -A1c  FEN/GI: Nothing by mouth after midnight. Continue PPI Prophylaxis: subcutaneous heparin Disposition: pending further evaluation Code Status:full code   Patient Active Problem List   Diagnosis Date Noted  . History of Clostridium difficile 11/05/2013  . NSTEMI (non-ST elevated myocardial infarction) 08/29/2013  . Nausea & vomiting 08/27/2013  . Diarrhea 08/27/2013  . C. difficile diarrhea 08/27/2013  . Chest pain 06/16/2013  . Precordial chest pain 05/29/2013  . Coronary atherosclerosis of native coronary artery 05/29/2013  . Noncompliance 11/22/2012  . Anemia, normocytic normochromic   . Cardiomyopathy, ischemic 06/21/2012  . Cerebrovascular disease 06/08/2012  . Diabetes  mellitus, type II   . Hypertension   .  Arteriosclerotic cardiovascular disease (ASCVD) 10/25/2011  . Hyperlipidemia 10/24/2011  . BPH (benign prostatic hyperplasia) 05/07/2011   Past Medical History: Past Medical History  Diagnosis Date  . Diabetes mellitus, type II   . Hyperlipidemia     Lipid profile in 02/2012:135, 227, 41, 49  . Coronary atherosclerosis of native coronary artery     a. CABG x 4 in 1989 (VG->OM1->OM2, VG->RCA, LIMA->LAD), b. 05/2010: DES to VG-OM1/OM2, DES to distal LCx. c. NSTEMI in 04/2011 - TO distal LCX stent and VG->OM2. d. 02/2012 NSTEMI DES to VG-OM1/continuation to OM2 occluded. e. inferior STEMI s/p DES to SVG-RAMUS 06/2012. f. inferolat STEMI 09/2012 s/p DES to SVG-interm; g. Lex MV (11/14):  EF 35%, inf-lat scar with small peri-infarct ischemia  . Essential hypertension, benign   . Osteoarthritis   . History of stroke   . History of pneumonia   . Cervical vertebral fracture   . Chronic back pain   . Benign prostatic hypertrophy     History of urinary retention  . Peptic ulcer disease   . Gastroesophageal reflux disease   . Ischemic cardiomyopathy     LVEF 45-50%; Echo (04/2013):  mild LVH, EF 50%, Gr 1 DD, inf-lat HK, inf-septal and ant-lat HK, Tr MR, mild LAE    Past Surgical History: Past Surgical History  Procedure Laterality Date  . Tonsillectomy    . Coronary artery bypass graft      Social History: History   Social History  . Marital Status: Divorced    Spouse Name: N/A    Number of Children: N/A  . Years of Education: N/A   Occupational History  . Retired     Designer, television/film set   Social History Main Topics  . Smoking status: Former Smoker -- 2.00 packs/day for 10 years    Types: Cigarettes    Quit date: 07/14/1961  . Smokeless tobacco: Current User    Types: Chew  . Alcohol Use: No  . Drug Use: No  . Sexual Activity: No   Other Topics Concern  . None   Social History Narrative   ** Merged History Encounter **...   He was raised by his uncle.  His mother deceased when    he was 35 year old with appendicitis. Father deceased from an MI at age   63.  He has 1 sister, but he does not know her health status as they   have been separated.           Family History: Family History  Problem Relation Age of Onset  . Early death      Parents died young  . Appendicitis Mother     Pt was 29 year old  . Heart attack Father 88    Allergies: No Known Allergies  Current Facility-Administered Medications  Medication Dose Route Frequency Provider Last Rate Last Dose  . 0.9 %  sodium chloride infusion   Intravenous Continuous Shanda Howells, MD      . albuterol (PROVENTIL HFA;VENTOLIN HFA) 108 (90 BASE) MCG/ACT inhaler 2 puff  2 puff Inhalation Q6H PRN Shanda Howells, MD      . Derrill Memo ON 05/19/2014] aspirin EC tablet 325 mg  325 mg Oral Daily Shanda Howells, MD      . Derrill Memo ON 05/19/2014] atorvastatin (LIPITOR) tablet 40 mg  40 mg Oral q1800 Shanda Howells, MD      . busPIRone (BUSPAR) tablet 5 mg  5 mg Oral BID  Shanda Howells, MD      . Derrill Memo ON 05/19/2014] carvedilol (COREG) tablet 3.125 mg  3.125 mg Oral BID WC Shanda Howells, MD      . furosemide (LASIX) tablet 20 mg  20 mg Oral Daily PRN Shanda Howells, MD      . heparin injection 5,000 Units  5,000 Units Subcutaneous 3 times per day Shanda Howells, MD      . lisinopril (PRINIVIL,ZESTRIL) tablet 2.5 mg  2.5 mg Oral BID Shanda Howells, MD      . morphine 2 MG/ML injection 2-4 mg  2-4 mg Intravenous Q3H PRN Shanda Howells, MD      . Derrill Memo ON 05/19/2014] multivitamin with minerals tablet 1 tablet  1 tablet Oral Daily Shanda Howells, MD      . nitroGLYCERIN (NITROSTAT) SL tablet 0.4 mg  0.4 mg Sublingual Q5 min PRN Shanda Howells, MD      . Derrill Memo ON 05/19/2014] pantoprazole (PROTONIX) EC tablet 40 mg  40 mg Oral Daily Shanda Howells, MD      . potassium chloride SA (K-DUR,KLOR-CON) CR tablet 20 mEq  20 mEq Oral Daily PRN Shanda Howells, MD      . ranolazine (RANEXA) 12 hr tablet 500 mg  500 mg Oral BID Shanda Howells, MD      .  sodium chloride 0.9 % injection 3 mL  3 mL Intravenous Q12H Shanda Howells, MD      . Derrill Memo ON 05/19/2014] tamsulosin (FLOMAX) capsule 0.4 mg  0.4 mg Oral QPC supper Shanda Howells, MD      . ticagrelor [facility]) tablet 90 mg  90 mg Oral BID Shanda Howells, MD       Current Outpatient Prescriptions  Medication Sig Dispense Refill  . albuterol (PROVENTIL HFA;VENTOLIN HFA) 108 (90 BASE) MCG/ACT inhaler Inhale 2 puffs into the lungs every 6 (six) hours as needed for wheezing or shortness of breath.    Marland Kitchen aspirin 81 MG tablet Take 81 mg by mouth daily.    Marland Kitchen atorvastatin (LIPITOR) 40 MG tablet Take 1 tablet (40 mg total) by mouth daily at 6 PM. 30 tablet 11  . bismuth subsalicylate (PEPTO BISMOL) 262 MG/15ML suspension Take 30 mLs by mouth every 6 (six) hours as needed for indigestion.    . busPIRone (BUSPAR) 5 MG tablet Take 5 mg by mouth 2 (two) times daily.    . carvedilol (COREG) 3.125 MG tablet Take 3.125 mg by mouth 2 (two) times daily with a meal.    . lisinopril (PRINIVIL,ZESTRIL) 2.5 MG tablet Take 2.5 mg by mouth 2 (two) times daily.    . metFORMIN (GLUCOPHAGE) 500 MG tablet Take 1,000 mg by mouth 2 (two) times daily with a meal.     . Multiple Vitamin (MULTIVITAMIN WITH MINERALS) TABS Take 1 tablet by mouth daily.    . nitroGLYCERIN (NITROSTAT) 0.4 MG SL tablet Place 0.4 mg under the tongue every 5 (five) minutes as needed for chest pain.    . pantoprazole (PROTONIX) 40 MG tablet Take 40 mg by mouth daily.    . potassium chloride SA (K-DUR,KLOR-CON) 20 MEQ tablet Take 20 mEq by mouth daily as needed (Takes only with Lasix).    . ranolazine (RANEXA) 500 MG 12 hr tablet Take 1 tablet (500 mg total) by mouth 2 (two) times daily. 60 tablet 11  . tamsulosin (FLOMAX) 0.4 MG CAPS Take 0.4 mg by mouth daily after supper.     . ticagrelor (BRILINTA) 90 MG TABS tablet Take 1 tablet (90  mg total) by mouth 2 (two) times daily. 60 tablet 6  . furosemide (LASIX) 20 MG tablet Take 20 mg by mouth daily as  needed for fluid. Take 1 tablet as needed if 3 lb weight gain.     Review Of Systems: 12 point ROS negative except as noted above in HPI.  Physical Exam: Filed Vitals:   05/18/14 2100  BP: 109/61  Pulse: 82  Temp:   Resp: 17    General: alert and cooperative HEENT: PERRLA and extra ocular movement intact Heart: S1, S2 normal, no murmur, rub or gallop, regular rate and rhythm Lungs: clear to auscultation, no wheezes or rales and unlabored breathing Abdomen: positive bowel sounds, trace right-sided abdominal pain (states that he had normal workup for this in ER w/in past  2 weeks). Extremities: extremities normal, atraumatic, no cyanosis or edema Skin:no rashes, no ecchymoses Neurology: normal without focal findings  Labs and Imaging: Lab Results  Component Value Date/Time   NA 138 05/18/2014 05:53 PM   K 4.2 05/18/2014 05:53 PM   CL 99 05/18/2014 05:53 PM   CO2 26 05/18/2014 05:53 PM   BUN 19 05/18/2014 05:53 PM   CREATININE 1.15 05/18/2014 05:53 PM   CREATININE 1.19 04/27/2013 11:40 AM   GLUCOSE 175* 05/18/2014 05:53 PM   Lab Results  Component Value Date   WBC 8.6 05/18/2014   HGB 13.5 05/18/2014   HCT 40.4 05/18/2014   MCV 94.4 05/18/2014   PLT 177 05/18/2014    Dg Chest Portable 1 View  05/18/2014   CLINICAL DATA:  Chest pain and diaphoresis.  Weakness  EXAM: PORTABLE CHEST - 1 VIEW  COMPARISON:  05/06/2014  FINDINGS: The patient is status post median sternotomy and CABG procedure. Normal heart size. No pleural effusion or edema. No airspace consolidation.  IMPRESSION: 1. No active cardiopulmonary abnormalities.   Electronically Signed   By: Kerby Moors M.D.   On: 05/18/2014 18:33           Shanda Howells MD  Pager: [phone]

## 2014-05-19 ENCOUNTER — Encounter (HOSPITAL_COMMUNITY)
Admission: EM | Disposition: A | Discharge status: Home / Self Care | Payer: Self-pay | Source: Home / Self Care | Attending: Cardiology

## 2014-05-19 ENCOUNTER — Encounter (HOSPITAL_COMMUNITY): Payer: Self-pay

## 2014-05-19 DIAGNOSIS — I252 Old myocardial infarction: Secondary | ICD-10-CM | POA: Diagnosis not present

## 2014-05-19 DIAGNOSIS — K219 Gastro-esophageal reflux disease without esophagitis: Secondary | ICD-10-CM | POA: Diagnosis present

## 2014-05-19 DIAGNOSIS — E785 Hyperlipidemia, unspecified: Secondary | ICD-10-CM | POA: Diagnosis present

## 2014-05-19 DIAGNOSIS — I200 Unstable angina: Secondary | ICD-10-CM

## 2014-05-19 DIAGNOSIS — M1990 Unspecified osteoarthritis, unspecified site: Secondary | ICD-10-CM | POA: Diagnosis present

## 2014-05-19 DIAGNOSIS — K279 Peptic ulcer, site unspecified, unspecified as acute or chronic, without hemorrhage or perforation: Secondary | ICD-10-CM | POA: Diagnosis present

## 2014-05-19 DIAGNOSIS — E119 Type 2 diabetes mellitus without complications: Secondary | ICD-10-CM | POA: Diagnosis present

## 2014-05-19 DIAGNOSIS — I10 Essential (primary) hypertension: Secondary | ICD-10-CM | POA: Diagnosis present

## 2014-05-19 DIAGNOSIS — Z8673 Personal history of transient ischemic attack (TIA), and cerebral infarction without residual deficits: Secondary | ICD-10-CM | POA: Diagnosis not present

## 2014-05-19 DIAGNOSIS — N400 Enlarged prostate without lower urinary tract symptoms: Secondary | ICD-10-CM | POA: Diagnosis present

## 2014-05-19 DIAGNOSIS — I679 Cerebrovascular disease, unspecified: Secondary | ICD-10-CM | POA: Diagnosis present

## 2014-05-19 DIAGNOSIS — I255 Ischemic cardiomyopathy: Secondary | ICD-10-CM

## 2014-05-19 DIAGNOSIS — I2510 Atherosclerotic heart disease of native coronary artery without angina pectoris: Secondary | ICD-10-CM

## 2014-05-19 DIAGNOSIS — I25110 Atherosclerotic heart disease of native coronary artery with unstable angina pectoris: Secondary | ICD-10-CM | POA: Diagnosis present

## 2014-05-19 DIAGNOSIS — R079 Chest pain, unspecified: Secondary | ICD-10-CM | POA: Diagnosis present

## 2014-05-19 DIAGNOSIS — F17220 Nicotine dependence, chewing tobacco, uncomplicated: Secondary | ICD-10-CM | POA: Diagnosis present

## 2014-05-19 DIAGNOSIS — Z8249 Family history of ischemic heart disease and other diseases of the circulatory system: Secondary | ICD-10-CM | POA: Diagnosis not present

## 2014-05-19 HISTORY — PX: CARDIAC CATHETERIZATION: SHX172

## 2014-05-19 HISTORY — PX: LEFT HEART CATHETERIZATION WITH CORONARY/GRAFT ANGIOGRAM: SHX5450

## 2014-05-19 LAB — GLUCOSE, CAPILLARY
GLUCOSE-CAPILLARY: 188 mg/dL — AB (ref 70–99)
GLUCOSE-CAPILLARY: 241 mg/dL — AB (ref 70–99)
Glucose-Capillary: 164 mg/dL — ABNORMAL HIGH (ref 70–99)
Glucose-Capillary: 181 mg/dL — ABNORMAL HIGH (ref 70–99)
Glucose-Capillary: 152 mg/dL — ABNORMAL HIGH (ref 70–99)
Glucose-Capillary: 149 mg/dL — ABNORMAL HIGH (ref 70–99)
Glucose-Capillary: 207 mg/dL — ABNORMAL HIGH (ref 70–99)

## 2014-05-19 LAB — CBG MONITORING, ED: Glucose-Capillary: 192 mg/dL — ABNORMAL HIGH (ref 70–99)

## 2014-05-19 LAB — CBC
HCT: 38.6 % — ABNORMAL LOW (ref 39.0–52.0)
HEMOGLOBIN: 13.2 g/dL (ref 13.0–17.0)
MCH: 32.3 pg (ref 26.0–34.0)
MCHC: 34.2 g/dL (ref 30.0–36.0)
MCV: 94.4 fL (ref 78.0–100.0)
Platelets: 130 10*3/uL — ABNORMAL LOW (ref 150–400)
RBC: 4.09 MIL/uL — AB (ref 4.22–5.81)
RDW: 12.8 % (ref 11.5–15.5)
WBC: 7.8 10*3/uL (ref 4.0–10.5)

## 2014-05-19 LAB — BASIC METABOLIC PANEL
Anion gap: 14 (ref 5–15)
BUN: 17 mg/dL (ref 6–23)
CO2: 24 mEq/L (ref 19–32)
Calcium: 8.9 mg/dL (ref 8.4–10.5)
Chloride: 102 mEq/L (ref 96–112)
Creatinine, Ser: 0.93 mg/dL (ref 0.50–1.35)
GFR calc Af Amer: 88 mL/min — ABNORMAL LOW (ref >90)
GFR calc non Af Amer: 76 mL/min — ABNORMAL LOW (ref >90)
GLUCOSE: 196 mg/dL — AB (ref 70–99)
POTASSIUM: 4.5 meq/L (ref 3.7–5.3)
Sodium: 140 mEq/L (ref 137–147)

## 2014-05-19 LAB — POCT ACTIVATED CLOTTING TIME: Activated Clotting Time: 107 seconds

## 2014-05-19 LAB — APTT: APTT: 28 s (ref 24–37)

## 2014-05-19 LAB — TSH: TSH: 5.08 u[IU]/mL — AB (ref 0.350–4.500)

## 2014-05-19 LAB — PROTIME-INR
INR: 0.98 (ref 0.00–1.49)
Prothrombin Time: 13.1 seconds (ref 11.6–15.2)

## 2014-05-19 LAB — HEMOGLOBIN A1C
HEMOGLOBIN A1C: 8.1 % — AB (ref <5.7)
Mean Plasma Glucose: 186 mg/dL — ABNORMAL HIGH (ref <117)

## 2014-05-19 LAB — TROPONIN I: Troponin I: <0.3 ng/mL (ref <0.30)

## 2014-05-19 LAB — HEPARIN LEVEL (UNFRACTIONATED): HEPARIN UNFRACTIONATED: 0.26 [IU]/mL — AB (ref 0.30–0.70)

## 2014-05-19 SURGERY — LEFT HEART CATHETERIZATION WITH CORONARY/GRAFT ANGIOGRAM
Anesthesia: LOCAL

## 2014-05-19 MED ORDER — HEPARIN (PORCINE) IN NACL 100-0.45 UNIT/ML-% IJ SOLN
1100.0000 [IU]/h | INTRAMUSCULAR | Status: DC
  Administered 2014-05-19: 1100 [IU]/h via INTRAVENOUS
  Filled 2014-05-19: qty 250

## 2014-05-19 MED ORDER — SODIUM CHLORIDE 0.9 % IV SOLN
INTRAVENOUS | Status: DC
  Administered 2014-05-19: 14:00:00 via INTRAVENOUS

## 2014-05-19 MED ORDER — SODIUM CHLORIDE 0.9 % IV BOLUS (SEPSIS)
250.0000 mL | Freq: Once | INTRAVENOUS | Status: AC
Start: 2014-05-19 — End: 2014-05-19
  Administered 2014-05-19: 250 mL via INTRAVENOUS

## 2014-05-19 MED ORDER — HEPARIN BOLUS VIA INFUSION
4000.0000 [IU] | Freq: Once | INTRAVENOUS | Status: AC
  Administered 2014-05-19: 4000 [IU] via INTRAVENOUS
  Filled 2014-05-19: qty 4000

## 2014-05-19 MED ORDER — ASPIRIN 81 MG PO CHEW: 81.0000 mg | CHEWABLE_TABLET | ORAL | Status: DC

## 2014-05-19 MED ORDER — LIDOCAINE HCL (PF) 1 % IJ SOLN
INTRAMUSCULAR | Status: AC
  Filled 2014-05-19: qty 30

## 2014-05-19 MED ORDER — MIDAZOLAM HCL 2 MG/2ML IJ SOLN
INTRAMUSCULAR | Status: AC
  Filled 2014-05-19: qty 2

## 2014-05-19 MED ORDER — CARVEDILOL 6.25 MG PO TABS
6.2500 mg | ORAL_TABLET | Freq: Two times a day (BID) | ORAL | Status: DC
Start: 2014-05-19 — End: 2014-05-21
  Administered 2014-05-20 – 2014-05-21 (×2): 6.25 mg via ORAL
  Filled 2014-05-19 (×6): qty 1

## 2014-05-19 MED ORDER — SODIUM CHLORIDE 0.9 % IV SOLN: 1.0000 mL/kg/h | INTRAVENOUS | Status: DC

## 2014-05-19 MED ORDER — RANOLAZINE ER 500 MG PO TB12
1000.0000 mg | ORAL_TABLET | Freq: Two times a day (BID) | ORAL | Status: DC
Start: 2014-05-19 — End: 2014-05-21
  Administered 2014-05-19 – 2014-05-21 (×4): 1000 mg via ORAL
  Filled 2014-05-19 (×5): qty 2

## 2014-05-19 MED ORDER — ACETAMINOPHEN 325 MG PO TABS: 650.0000 mg | ORAL_TABLET | ORAL | Status: DC | PRN

## 2014-05-19 MED ORDER — SODIUM CHLORIDE 0.9 % IJ SOLN: 3.0000 mL | Freq: Two times a day (BID) | INTRAMUSCULAR | Status: DC

## 2014-05-19 MED ORDER — NITROGLYCERIN 0.4 MG SL SUBL: 0.4000 mg | SUBLINGUAL_TABLET | SUBLINGUAL | Status: DC | PRN

## 2014-05-19 MED ORDER — HEPARIN (PORCINE) IN NACL 2-0.9 UNIT/ML-% IJ SOLN
INTRAMUSCULAR | Status: AC
  Filled 2014-05-19: qty 1500

## 2014-05-19 MED ORDER — FENTANYL CITRATE 0.05 MG/ML IJ SOLN
INTRAMUSCULAR | Status: AC
  Filled 2014-05-19: qty 2

## 2014-05-19 MED ORDER — ALBUTEROL SULFATE (2.5 MG/3ML) 0.083% IN NEBU
2.5000 mg | INHALATION_SOLUTION | Freq: Four times a day (QID) | RESPIRATORY_TRACT | Status: DC | PRN
  Administered 2014-05-21: 2.5 mg via RESPIRATORY_TRACT
  Filled 2014-05-19: qty 3

## 2014-05-19 MED ORDER — ONDANSETRON HCL 4 MG/2ML IJ SOLN
4.0000 mg | Freq: Four times a day (QID) | INTRAMUSCULAR | Status: DC | PRN
Start: 2014-05-19 — End: 2014-05-19

## 2014-05-19 MED ORDER — ASPIRIN 81 MG PO CHEW
81.0000 mg | CHEWABLE_TABLET | Freq: Every day | ORAL | Status: DC
  Administered 2014-05-19 – 2014-05-21 (×3): 81 mg via ORAL
  Filled 2014-05-19 (×4): qty 1

## 2014-05-19 MED ORDER — ISOSORBIDE MONONITRATE ER 60 MG PO TB24
60.0000 mg | ORAL_TABLET | Freq: Every day | ORAL | Status: DC
  Administered 2014-05-19 – 2014-05-21 (×2): 60 mg via ORAL
  Filled 2014-05-19 (×3): qty 1

## 2014-05-19 MED ORDER — SODIUM CHLORIDE 0.9 % IV SOLN: 250.0000 mL | INTRAVENOUS | Status: DC | PRN

## 2014-05-19 MED ORDER — ONDANSETRON HCL 4 MG/2ML IJ SOLN: 4.0000 mg | Freq: Four times a day (QID) | INTRAMUSCULAR | Status: DC | PRN

## 2014-05-19 MED ORDER — NITROGLYCERIN 2 % TD OINT
1.0000 [in_us] | TOPICAL_OINTMENT | Freq: Four times a day (QID) | TRANSDERMAL | Status: DC
  Administered 2014-05-19: 1 [in_us] via TOPICAL
  Filled 2014-05-19: qty 30

## 2014-05-19 MED ORDER — SODIUM CHLORIDE 0.9 % IJ SOLN: 3.0000 mL | INTRAMUSCULAR | Status: DC | PRN

## 2014-05-19 MED ORDER — HEPARIN SODIUM (PORCINE) 5000 UNIT/ML IJ SOLN
5000.0000 [IU] | Freq: Three times a day (TID) | INTRAMUSCULAR | Status: DC
  Administered 2014-05-20 – 2014-05-21 (×4): 5000 [IU] via SUBCUTANEOUS
  Filled 2014-05-19 (×9): qty 1

## 2014-05-19 MED ORDER — NITROGLYCERIN 1 MG/10 ML FOR IR/CATH LAB
INTRA_ARTERIAL | Status: AC
  Filled 2014-05-19: qty 10

## 2014-05-19 NOTE — Interval H&P Note (Signed)
Cath Lab Visit (complete for each Cath Lab visit)  Clinical Evaluation Leading to the Procedure:   ACS: No.  Non-ACS:    Anginal Classification: CCS III  Anti-ischemic medical therapy: Maximal Therapy (2 or more classes of medications)  Non-Invasive Test Results: No non-invasive testing performed  Prior CABG: Previous CABG      History and Physical Interval Note:  05/19/2014 11:09 AM  Colin Benton  has presented today for surgery, with the diagnosis of cp  The various methods of treatment have been discussed with the patient and family. After consideration of risks, benefits and other options for treatment, the patient has consented to  Procedure(s): LEFT HEART CATHETERIZATION WITH CORONARY/GRAFT ANGIOGRAM (N/A) as a surgical intervention .  The patient's history has been reviewed, patient examined, no change in status, stable for surgery.  I have reviewed the patient's chart and labs.  Questions were answered to the patient's satisfaction.     KELLY,THOMAS A

## 2014-05-19 NOTE — Progress Notes (Signed)
New orders for NS bolus; administering bolus now; will cont. To monitor.

## 2014-05-19 NOTE — H&P (View-Only) (Signed)
Subjective: No further pain or SOB, feels better  Objective: Vital signs in last 24 hours: Temp:  [97.4 F (36.3 C)-98.2 F (36.8 C)] 98.2 F (36.8 C) (11/06 0506) Pulse Rate:  [71-86] 73 (11/06 0506) Resp:  [14-22] 18 (11/06 0506) BP: (89-145)/(55-89) 134/89 mmHg (11/06 0506) SpO2:  [93 %-99 %] 97 % (11/06 0506) Weight:  [177 lb 4 oz (80.4 kg)-180 lb (81.647 kg)] 177 lb 4 oz (80.4 kg) (11/06 0154) Weight change:  Last BM Date: 05/18/14 Intake/Output from previous day: -250 11/05 0701 - 11/06 0700 In: -  Out: 250 [Urine:250] Intake/Output this shift:    PE: General:Pleasant affect, NAD Skin:Warm and dry, brisk capillary refill HEENT:normocephalic, sclera clear, mucus membranes moist Neck:supple, no JVD Heart:S1S2 RRR without murmur, gallup, rub or click Lungs:clear ant. without rales, rhonchi, or wheezes EVO:JJKK, mild epigastric tenderness, + BS, do not palpate liver spleen or masses Ext:no lower ext edema, 2+ pedal pulses, 2+ radial pulses Neuro:alert and oriented, MAE, follows commands, + facial symmetry EKG:  Less inf lat ischemia than on [place] EKG TELE :  SR   Lab Results:  Recent Labs  05/18/14 1753 05/19/14 0404  WBC 8.6 7.8  HGB 13.5 13.2  HCT 40.4 38.6*  PLT 177 130*   BMET  Recent Labs  05/18/14 1753 05/19/14 0404  NA 138 140  K 4.2 4.5  CL 99 102  CO2 26 24  GLUCOSE 175* 196*  BUN 19 17  CREATININE 1.15 0.93  CALCIUM 9.9 8.9    Recent Labs  05/18/14 1753 05/18/14 2053  TROPONINI <0.30 <0.30      Hepatic Function Panel  Recent Labs  05/18/14 1753  PROT 7.3  ALBUMIN 4.0  AST 28  ALT 27  ALKPHOS 83  BILITOT 0.4   No results for input(s): CHOL in the last 72 hours. No results for input(s): PROTIME in the last 72 hours.     Studies/Results: Dg Chest Portable 1 View  05/18/2014   CLINICAL DATA:  Chest pain and diaphoresis.  Weakness  EXAM: PORTABLE CHEST - 1 VIEW  COMPARISON:  05/06/2014  FINDINGS: The  patient is status post median sternotomy and CABG procedure. Normal heart size. No pleural effusion or edema. No airspace consolidation.  IMPRESSION: 1. No active cardiopulmonary abnormalities.   Electronically Signed   By: Kerby Moors M.D.   On: 05/18/2014 18:33    Medications: I have reviewed the patient's current medications. Scheduled Meds: . aspirin  81 mg Oral Daily  . atorvastatin  40 mg Oral q1800  . busPIRone  5 mg Oral BID  . carvedilol  3.125 mg Oral BID WC  . insulin aspart  0-9 Units Subcutaneous 6 times per day  . lisinopril  2.5 mg Oral BID  . multivitamin with minerals  1 tablet Oral Daily  . nitroGLYCERIN  1 inch Topical 4 times per day  . pantoprazole  40 mg Oral Daily  . ranolazine  500 mg Oral BID  . sodium chloride  3 mL Intravenous Q12H  . tamsulosin  0.4 mg Oral QPC supper  . ticagrelor  90 mg Oral BID   Continuous Infusions: . heparin 1,100 Units/hr (05/19/14 0429)   PRN Meds:.acetaminophen, albuterol, morphine injection, nitroGLYCERIN, nitroGLYCERIN, ondansetron (ZOFRAN) IV  Assessment/Plan: 80 y.o. male with a history of ASCAD s/p CABG x 4 in 1989 (VG->OM1->OM2, VG->RCA, LIMA->LAD), 05/2010: DES to VG-OM1/OM2, DES to distal LCx., NSTEMI in 04/2011 - TO distal LCX stent  and VG->OM2.,. 02/2012 NSTEMI DES to VG-OM1/continuation to OM2 occluded., inferior STEMI s/p DES to SVG-RAMUS 06/2012, inferolat STEMI 09/2012 s/p DES to SVG-interm and Lex MV EF 35%, inf-lat scar with small peri-infarct ischemia 11/14 on medical management, HTN, type II DM, dyslipidemia and CVA who [facility] with [facility] of chest pain. Admitted to rule out, second troponin has not been drawn, have reordered.  Pt does not want stress test he would prefer cath.  Initial EKG showing shows persistent T-wave inversions in the anterior lateral as well as some inferior leads.repeat EKG shows similar pattern though somewhat improved. Patient currently denies any significant chest pain  He denies any recent SOB, PND or orthopnea. He denies any LE edema. He says that he cannot tell the difference between his GERD and CAD pain.     1. Acute chest pain that is unclear whether related to underlying coronary ischemia or GERD. Initial cardiac enzymes are negative. His initial EKG on admit to ER showed T wave inversions in the inferolateral leads which are similar to what he has had on other EKGs but this improved on second EKG. He is currently pain free. EKG stable.  On IV heparin. 2.  3. ASCAD with extensive history of multiple PCI's after CABG. He says he has not had any significant angina in the past year until the last month but again is unsure if his discomfort is related to GERD or CAD. 4.  5. Type II DM- glucose mildly elevated- on SSI for NPO 6. HTN well controlled Dyslipidemia   Lipid Panel     Component Value Date/Time   CHOL 190 05/29/2013 0410   TRIG 335* 05/29/2013 0410   HDL 33* 05/29/2013 0410   CHOLHDL 5.8 05/29/2013 0410   VLDL 67* 05/29/2013 0410   LDLCALC 90 05/29/2013 0410      Possible cath today.  Dr. Debara Pickett to see.   LOS: 1 day   Time spent with pt. :15 minutes. [facility] R  Nurse Practitioner Certified Pager [phone] or after 5pm and on weekends call [phone] 05/19/2014, 8:07 AM

## 2014-05-19 NOTE — Progress Notes (Signed)
Subjective: No further pain or SOB, feels better  Objective: Vital signs in last 24 hours: Temp:  [97.4 F (36.3 C)-98.2 F (36.8 C)] 98.2 F (36.8 C) (11/06 0506) Pulse Rate:  [71-86] 73 (11/06 0506) Resp:  [14-22] 18 (11/06 0506) BP: (89-145)/(55-89) 134/89 mmHg (11/06 0506) SpO2:  [93 %-99 %] 97 % (11/06 0506) Weight:  [177 lb 4 oz (80.4 kg)-180 lb (81.647 kg)] 177 lb 4 oz (80.4 kg) (11/06 0154) Weight change:  Last BM Date: 05/18/14 Intake/Output from previous day: -250 11/05 0701 - 11/06 0700 In: -  Out: 250 [Urine:250] Intake/Output this shift:    PE: General:Pleasant affect, NAD Skin:Warm and dry, brisk capillary refill HEENT:normocephalic, sclera clear, mucus membranes moist Neck:supple, no JVD Heart:S1S2 RRR without murmur, gallup, rub or click Lungs:clear ant. without rales, rhonchi, or wheezes YIF:OYDX, mild epigastric tenderness, + BS, do not palpate liver spleen or masses Ext:no lower ext edema, 2+ pedal pulses, 2+ radial pulses Neuro:alert and oriented, MAE, follows commands, + facial symmetry EKG:  Less inf lat ischemia than on [place] EKG TELE :  SR   Lab Results:  Recent Labs  05/18/14 1753 05/19/14 0404  WBC 8.6 7.8  HGB 13.5 13.2  HCT 40.4 38.6*  PLT 177 130*   BMET  Recent Labs  05/18/14 1753 05/19/14 0404  NA 138 140  K 4.2 4.5  CL 99 102  CO2 26 24  GLUCOSE 175* 196*  BUN 19 17  CREATININE 1.15 0.93  CALCIUM 9.9 8.9    Recent Labs  05/18/14 1753 05/18/14 2053  TROPONINI <0.30 <0.30      Hepatic Function Panel  Recent Labs  05/18/14 1753  PROT 7.3  ALBUMIN 4.0  AST 28  ALT 27  ALKPHOS 83  BILITOT 0.4   No results for input(s): CHOL in the last 72 hours. No results for input(s): PROTIME in the last 72 hours.     Studies/Results: Dg Chest Portable 1 View  05/18/2014   CLINICAL DATA:  Chest pain and diaphoresis.  Weakness  EXAM: PORTABLE CHEST - 1 VIEW  COMPARISON:  05/06/2014  FINDINGS: The  patient is status post median sternotomy and CABG procedure. Normal heart size. No pleural effusion or edema. No airspace consolidation.  IMPRESSION: 1. No active cardiopulmonary abnormalities.   Electronically Signed   By: Kerby Moors M.D.   On: 05/18/2014 18:33    Medications: I have reviewed the patient's current medications. Scheduled Meds: . aspirin  81 mg Oral Daily  . atorvastatin  40 mg Oral q1800  . busPIRone  5 mg Oral BID  . carvedilol  3.125 mg Oral BID WC  . insulin aspart  0-9 Units Subcutaneous 6 times per day  . lisinopril  2.5 mg Oral BID  . multivitamin with minerals  1 tablet Oral Daily  . nitroGLYCERIN  1 inch Topical 4 times per day  . pantoprazole  40 mg Oral Daily  . ranolazine  500 mg Oral BID  . sodium chloride  3 mL Intravenous Q12H  . tamsulosin  0.4 mg Oral QPC supper  . ticagrelor  90 mg Oral BID   Continuous Infusions: . heparin 1,100 Units/hr (05/19/14 0429)   PRN Meds:.acetaminophen, albuterol, morphine injection, nitroGLYCERIN, nitroGLYCERIN, ondansetron (ZOFRAN) IV  Assessment/Plan: 80 y.o. male with a history of ASCAD s/p CABG x 4 in 1989 (VG->OM1->OM2, VG->RCA, LIMA->LAD), 05/2010: DES to VG-OM1/OM2, DES to distal LCx., NSTEMI in 04/2011 - TO distal LCX stent  and VG->OM2.,. 02/2012 NSTEMI DES to VG-OM1/continuation to OM2 occluded., inferior STEMI s/p DES to SVG-RAMUS 06/2012, inferolat STEMI 09/2012 s/p DES to SVG-interm and Lex MV EF 35%, inf-lat scar with small peri-infarct ischemia 11/14 on medical management, HTN, type II DM, dyslipidemia and CVA who [facility] with [facility] of chest pain. Admitted to rule out, second troponin has not been drawn, have reordered.  Pt does not want stress test he would prefer cath.  Initial EKG showing shows persistent T-wave inversions in the anterior lateral as well as some inferior leads.repeat EKG shows similar pattern though somewhat improved. Patient currently denies any significant chest pain  He denies any recent SOB, PND or orthopnea. He denies any LE edema. He says that he cannot tell the difference between his GERD and CAD pain.     1. Acute chest pain that is unclear whether related to underlying coronary ischemia or GERD. Initial cardiac enzymes are negative. His initial EKG on admit to ER showed T wave inversions in the inferolateral leads which are similar to what he has had on other EKGs but this improved on second EKG. He is currently pain free. EKG stable.  On IV heparin. 2.  3. ASCAD with extensive history of multiple PCI's after CABG. He says he has not had any significant angina in the past year until the last month but again is unsure if his discomfort is related to GERD or CAD. 4.  5. Type II DM- glucose mildly elevated- on SSI for NPO 6. HTN well controlled Dyslipidemia   Lipid Panel     Component Value Date/Time   CHOL 190 05/29/2013 0410   TRIG 335* 05/29/2013 0410   HDL 33* 05/29/2013 0410   CHOLHDL 5.8 05/29/2013 0410   VLDL 67* 05/29/2013 0410   LDLCALC 90 05/29/2013 0410      Possible cath today.  Dr. Debara Pickett to see.   LOS: 1 day   Time spent with pt. :15 minutes. [facility] R  Nurse Practitioner Certified Pager [phone] or after 5pm and on weekends call [phone] 05/19/2014, 8:07 AM

## 2014-05-19 NOTE — Progress Notes (Signed)
ANTICOAGULATION CONSULT NOTE - Initial Consult  Pharmacy Consult for heparin Indication: chest pain/ACS  No Known Allergies  Patient Measurements: Height: 5\' 10"  (177.8 cm) Weight: 177 lb 4 oz (80.4 kg) IBW/kg (Calculated) : 73  Vital Signs: Temp: 98.2 F (36.8 C) (11/06 0154) Temp Source: Oral (11/06 0154) BP: 113/61 mmHg (11/06 0154) Pulse Rate: 79 (11/06 0154)  Labs:  Recent Labs  05/18/14 1753 05/18/14 2053  HGB 13.5  --   HCT 40.4  --   PLT 177  --   CREATININE 1.15  --   TROPONINI <0.30 <0.30    Estimated Creatinine Clearance: 50.3 mL/min (by C-G formula based on Cr of 1.15).   Medical History: Past Medical History  Diagnosis Date  . Diabetes mellitus, type II   . Hyperlipidemia     Lipid profile in 02/2012:135, 227, 41, 49  . Coronary atherosclerosis of native coronary artery     a. CABG x 4 in 1989 (VG->OM1->OM2, VG->RCA, LIMA->LAD), b. 05/2010: DES to VG-OM1/OM2, DES to distal LCx. c. NSTEMI in 04/2011 - TO distal LCX stent and VG->OM2. d. 02/2012 NSTEMI DES to VG-OM1/continuation to OM2 occluded. e. inferior STEMI s/p DES to SVG-RAMUS 06/2012. f. inferolat STEMI 09/2012 s/p DES to SVG-interm; g. Lex MV (11/14):  EF 35%, inf-lat scar with small peri-infarct ischemia  . Essential hypertension, benign   . Osteoarthritis   . History of stroke   . History of pneumonia   . Cervical vertebral fracture   . Chronic back pain   . Benign prostatic hypertrophy     History of urinary retention  . Peptic ulcer disease   . Gastroesophageal reflux disease   . Ischemic cardiomyopathy     LVEF 45-50%; Echo (04/2013):  mild LVH, EF 50%, Gr 1 DD, inf-lat HK, inf-septal and ant-lat HK, Tr MR, mild LAE    Medications:  Prescriptions prior to admission  Medication Sig Dispense Refill Last Dose  . albuterol (PROVENTIL HFA;VENTOLIN HFA) 108 (90 BASE) MCG/ACT inhaler Inhale 2 puffs into the lungs every 6 (six) hours as needed for wheezing or shortness of breath.   05/18/2014  at Unknown time  . aspirin 81 MG tablet Take 81 mg by mouth daily.   05/18/2014 at Unknown time  . atorvastatin (LIPITOR) 40 MG tablet Take 1 tablet (40 mg total) by mouth daily at 6 PM. 30 tablet 11 05/18/2014 at 1600  . bismuth subsalicylate (PEPTO BISMOL) 262 MG/15ML suspension Take 30 mLs by mouth every 6 (six) hours as needed for indigestion.   05/18/2014 at Unknown time  . busPIRone (BUSPAR) 5 MG tablet Take 5 mg by mouth 2 (two) times daily.   05/18/2014 at Unknown time  . carvedilol (COREG) 3.125 MG tablet Take 3.125 mg by mouth 2 (two) times daily with a meal.   05/18/2014 at 1600  . lisinopril (PRINIVIL,ZESTRIL) 2.5 MG tablet Take 2.5 mg by mouth 2 (two) times daily.   05/18/2014 at Unknown time  . metFORMIN (GLUCOPHAGE) 500 MG tablet Take 1,000 mg by mouth 2 (two) times daily with a meal.    05/18/2014 at Unknown time  . Multiple Vitamin (MULTIVITAMIN WITH MINERALS) TABS Take 1 tablet by mouth daily.   05/18/2014 at Unknown time  . nitroGLYCERIN (NITROSTAT) 0.4 MG SL tablet Place 0.4 mg under the tongue every 5 (five) minutes as needed for chest pain.   05/18/2014 at 1600  . pantoprazole (PROTONIX) 40 MG tablet Take 40 mg by mouth daily.   05/18/2014 at Unknown time  .  potassium chloride SA (K-DUR,KLOR-CON) 20 MEQ tablet Take 20 mEq by mouth daily as needed (Takes only with Lasix).   more than a month  . ranolazine (RANEXA) 500 MG 12 hr tablet Take 1 tablet (500 mg total) by mouth 2 (two) times daily. 60 tablet 11 05/18/2014 at Unknown time  . tamsulosin (FLOMAX) 0.4 MG CAPS Take 0.4 mg by mouth daily after supper.    05/18/2014 at Unknown time  . ticagrelor (BRILINTA) 90 MG TABS tablet Take 1 tablet (90 mg total) by mouth 2 (two) times daily. 60 tablet 6 05/18/2014 at Unknown time  . furosemide (LASIX) 20 MG tablet Take 20 mg by mouth daily as needed for fluid. Take 1 tablet as needed if 3 lb weight gain.   more than a month   Scheduled:  . aspirin  81 mg Oral Daily  . atorvastatin  40 mg Oral  q1800  . busPIRone  5 mg Oral BID  . carvedilol  3.125 mg Oral BID WC  . insulin aspart  0-9 Units Subcutaneous 6 times per day  . lisinopril  2.5 mg Oral BID  . multivitamin with minerals  1 tablet Oral Daily  . nitroGLYCERIN  1 inch Topical 4 times per day  . pantoprazole  40 mg Oral Daily  . ranolazine  500 mg Oral BID  . sodium chloride  3 mL Intravenous Q12H  . tamsulosin  0.4 mg Oral QPC supper  . ticagrelor  90 mg Oral BID    Assessment: 80yo male w/ extensive cardiac hx c/o CP, no relief w/ NTG x2 PTA, unclear whether related to underlying coronary ischemia vs GERD, to begin heparin for now.  Goal of Therapy:  Heparin level 0.3-0.7 units/ml Monitor platelets by anticoagulation protocol: Yes   Plan:  Will give heparin 4000 units followed by gtt at 1100 units/hr and monitor heparin levels and CBC.  Jeffrey [name], PharmD, BCPS  05/19/2014,3:21 AM

## 2014-05-19 NOTE — Progress Notes (Signed)
  Echocardiogram 2D Echocardiogram has been performed.  Jeffrey [name] [name] 05/19/2014, 10:38 AM

## 2014-05-19 NOTE — Progress Notes (Signed)
Site area:rt groin  Site Prior to Removal:  Level 0 Pressure Applied For:20 minutes Manual:   pressure Patient Status During Pull:  Level 0 Post Pull Site:  Level 0 Post Pull Instructions Given:  yes Post Pull Pulses Present: rt dp palpable Dressing Applied: yes Bedrest begins @ 12:45:00 Comments:patient denies pain or discomfort

## 2014-05-19 NOTE — Progress Notes (Addendum)
Pt BP now 82/40 post cardiac cath; pt denies pain; R groin site level 0; pt states he just feels tired; PA paged; will await callback; NS infusing now at 175ml/hr; will cont. To monitor.

## 2014-05-19 NOTE — CV Procedure (Signed)
Jeffrey [name] is a 80 y.o. male    [id number]  [id number] LOCATION:  FACILITY: [facility]  PHYSICIAN: Troy Sine, MD, [facility] 1929/05/01   DATE OF PROCEDURE:  05/19/2014    CARDIAC CATHETERIZATION     HISTORY:    Jeffrey [name] is a 80 y.o. male has established coronary artery disease and in 1989 underwent CABG surgery 4 with an SVG to the OM1 and OM 2, SVG to the RCA, and LIMA to LAD.  He is status post multiple interventions since that time and has undergone stenting to the vein supplying the OM1 and OM 2 vessel, stenting to the distal circumflex, as well as had multiple non-ST segment elevation MIs.  He last underwent in-stent restenosis intervention to the vein graft to the circumflex system. He has developed indigestion-like symptoms.  He also notes chest pressure, leading to his admission.  He is referred for definitive cardiac catheterization   PROCEDURE: Left heart catheterization: Coronary angiography into the native coronary arteries, selective angiography into the saphenous vein grafts and left internal mammary artery, left ventriculography  The patient was brought to the [facility] cardiac catherization laboratory in the fasting state. He was premedicated with Versed 1 mg and fentanyl 25 g. His Her right groin was prepped and shaved in usual sterile fashion. Xylocaine 1% was used for local anesthesia. A 5 French sheath was inserted into the R femoral artery. Diagnostic catheterizatiion was done with 5 [place] FL4, FR4, and pigtail catheters. Left ventriculography was done with 27 cc Omnipaque contrast. Hemostasis was obtained by direct manual compression. The patient tolerated the procedure well.   HEMODYNAMICS:   Central Aorta: 130/60   Left Ventricle: 130/16  ANGIOGRAPHY:  Fluoroscopy revealed coronary calcification  Left main: The left main coronary artery bifurcated into the LAD and left circumflex vessel.  LAD: LAD had 90% stenosis immediately beyond the  ostium.  The LAD was occluded after several septal perforating arteries.  There was significant collateralization via the septal perforating arteries to the distal RCA.   Left circumflex: there was a 99% very proximal stenosis.  The OM marginal branch was occluded immediately below beyond this stenosis.  The mid AV groove circumflex was occluded.  Right coronary artery: diffusely diseased proximal vessel with total mid occlusion and evidence for bridging collaterals faintly applying more distal RCA.  LIMA to LAD: widely patent and anastomosed into the mid LAD.  There was good filling of a prominent diagonal vessel.  Collaterals now supplying the obtuse marginal branch up to a small portion of the AV groove circumflex and collaterals are also present supplying the distal RCA.  SVG to OM1/OM was now completely occluded ostially proximal to the remotely placed very proximal stent which had undergone numerous interventions.  SVG to RCA is occluded and this is old.   Left ventriculography revealed moderately severe LV dysfunction with an ejection fraction of approximately 35%.  There was a pronounced inferior hypocontractility and mild mid anterolateral hypocontractility   IMPRESSION:  Ischemic cardiomyopathy with an ejection fraction of 35% and inferior hypokinesis with mild mid anterolateral hypokinesis.  Severe native coronary obstructive disease with 90% stenosis in the proximal LAD with occlusion beyond the septal perforator; 99% proximal circumflex stenosis with mid occlusion and occlusion of the OM1 arising from the proximal circumflex; and total mid RCA occlusion with faint bridging collaterals as well as extensive collaterals to the distal RCA, both via septal perforating arteries as well as the that, LIMA to LAD distal collaterals.  Widely patent LIMA graft supplying the mid LAD and diagonal vessel which also collateralizes the distal RCA as well as an obtuse marginal branch of the  circumflex vessel.  Occluded sequential vein graft which had supplied the OM1 and OM 2 vessel in which had previously undergone numerous interventions.  Occluded vein graft which had supplied the distal RCA.  RECOMMENDATION:  Since the patient's last catheterization.  His previously restenotic SVG to OM1/OM2, which was intervened upon is now completely occluded.  He now has improved collaterals to a circumflex marginal vessel via the LIMA to LAD distal collaterals.  His RCA is occluded as is the graft but moderate collaterals exist both from septal perforating arteries as well as the LIMA to LAD.  An increased medical regimen will be initially recommended with further titration of Ranexa from 500 mg twice a day to 1000 g twice a day, addition of oral nitrate therapy, and slight titration of his carvedilol to 6.25 mg twice a day.  Troy Sine, MD, [facility] 05/19/2014 12:13 PM

## 2014-05-19 NOTE — Progress Notes (Signed)
BP now 98/51; BR up at this time; pt sitting on side of bed to eat; will cont. To monitor.

## 2014-05-19 NOTE — H&P (Signed)
[address] [place],   [address] Phone: [phone] Fax:  [phone]  Date:  05/19/2014   ID:  Jeffrey [name], DOB [birth year], MRN [id number]  PCP:  [name]., MD  Cardiologist:  Dr. Edward Qualia    History of Present Illness: Jeffrey [name] is a 80 y.o. male with a history of ASCAD s/p CABG x 4 in 1989 (VG->OM1->OM2, VG->RCA, LIMA->LAD), 05/2010: DES to VG-OM1/OM2, DES to distal LCx., NSTEMI in 04/2011 - TO distal LCX stent and VG->OM2.,. 02/2012 NSTEMI DES to VG-OM1/continuation to OM2 occluded., inferior STEMI s/p DES to SVG-RAMUS 06/2012, inferolat STEMI 09/2012 s/p DES to SVG-interm and Lex MV  EF 35%, inf-lat scar with small peri-infarct ischemia 11/14 on medical management, HTN, type II DM, dyslipidemia and CVA who presented to [facility] with complaints of chest pain.  He states he has recurrent episodes of chest pain and epigastric discomfort recently and was seen in the hospital and was told it was indigestion. Has been seen several times in the ER for this in the past. Patient states he's had some substernal discomfort that started on his right side but then went across his chest and he thought was associated with gas over the past 24 hours. States he's had milder symptoms over the past 3-4 weeks. Patient states the symptoms became off moderate earlier today. He took some Pepto-Bismol as well as 2 nitroglycerin with mild to moderate improvement in symptoms. Symptoms persisted and he proceeded to come to the ER. Patient states that upon arrival to the ER he had severe crushing substernal chest pain with associated nausea and diaphoresis. Patient does not specify overall duration of symptoms. States the symptoms come and go on a regular basis. Patient thought symptoms related to reflux. Has been compliant with his PPI. Denies any recent NSAID use. No shortness of breath.  In the ER Troponins negative 2.chest x-ray shows no acute cardiopulmonary disease.  Initial EKG showing shows persistent T-wave inversions in the anterior lateral as well as some inferior leads.repeat EKG shows similar pattern though somewhat improved. Patient currently denies any significant chest pain   He denies any recent SOB, PND or orthopnea.  He denies any LE edema.  He says that he cannot tell the difference between his GERD and CAD pain.      Wt Readings from Last 3 Encounters:  05/19/14 177 lb 4 oz (80.4 kg)  05/06/14 180 lb (81.647 kg)  04/18/14 180 lb (81.647 kg)     Past Medical History  Diagnosis Date  . Diabetes mellitus, type II   . Hyperlipidemia     Lipid profile in 02/2012:135, 227, 41, 49  . Coronary atherosclerosis of native coronary artery     a. CABG x 4 in 1989 (VG->OM1->OM2, VG->RCA, LIMA->LAD), b. 05/2010: DES to VG-OM1/OM2, DES to distal LCx. c. NSTEMI in 04/2011 - TO distal LCX stent and VG->OM2. d. 02/2012 NSTEMI DES to VG-OM1/continuation to OM2 occluded. e. inferior STEMI s/p DES to SVG-RAMUS 06/2012. f. inferolat STEMI 09/2012 s/p DES to SVG-interm; g. Lex MV (11/14):  EF 35%, inf-lat scar with small peri-infarct ischemia  . Essential hypertension, benign   . Osteoarthritis   . History of stroke   . History of pneumonia   . Cervical vertebral fracture   . Chronic back pain   . Benign prostatic hypertrophy     History of urinary retention  . Peptic ulcer disease   . Gastroesophageal reflux disease   . Ischemic cardiomyopathy  LVEF 45-50%; Echo (04/2013):  mild LVH, EF 50%, Gr 1 DD, inf-lat HK, inf-septal and ant-lat HK, Tr MR, mild LAE    Current Facility-Administered Medications  Medication Dose Route Frequency Provider Last Rate Last Dose  . 0.9 %  sodium chloride infusion   Intravenous Continuous Shanda Howells, MD      . albuterol (PROVENTIL) (2.5 MG/3ML) 0.083% nebulizer solution 2.5 mg  2.5 mg Nebulization Q6H PRN Sueanne Margarita, MD      . aspirin EC tablet 325 mg  325 mg Oral Daily Shanda Howells, MD      . atorvastatin  (LIPITOR) tablet 40 mg  40 mg Oral q1800 Shanda Howells, MD      . busPIRone (BUSPAR) tablet 5 mg  5 mg Oral BID Shanda Howells, MD      . carvedilol (COREG) tablet 3.125 mg  3.125 mg Oral BID WC Shanda Howells, MD      . furosemide (LASIX) tablet 20 mg  20 mg Oral Daily PRN Shanda Howells, MD      . heparin injection 5,000 Units  5,000 Units Subcutaneous 3 times per day Shanda Howells, MD      . insulin aspart (novoLOG) injection 0-9 Units  0-9 Units Subcutaneous 6 times per day Shanda Howells, MD      . lisinopril (PRINIVIL,ZESTRIL) tablet 2.5 mg  2.5 mg Oral BID Shanda Howells, MD      . morphine 2 MG/ML injection 2-4 mg  2-4 mg Intravenous Q3H PRN Shanda Howells, MD      . multivitamin with minerals tablet 1 tablet  1 tablet Oral Daily Shanda Howells, MD      . nitroGLYCERIN (NITROSTAT) SL tablet 0.4 mg  0.4 mg Sublingual Q5 min PRN Shanda Howells, MD      . pantoprazole (PROTONIX) EC tablet 40 mg  40 mg Oral Daily Shanda Howells, MD      . potassium chloride SA (K-DUR,KLOR-CON) CR tablet 20 mEq  20 mEq Oral Daily PRN Shanda Howells, MD      . ranolazine (RANEXA) 12 hr tablet 500 mg  500 mg Oral BID Shanda Howells, MD      . sodium chloride 0.9 % injection 3 mL  3 mL Intravenous Q12H Shanda Howells, MD      . tamsulosin (FLOMAX) capsule 0.4 mg  0.4 mg Oral QPC supper Shanda Howells, MD      . ticagrelor Kary Kos) tablet 90 mg  90 mg Oral BID Shanda Howells, MD        Allergies:   No Known Allergies  Social History:  The patient  reports that he quit smoking about 52 years ago. His smoking use included Cigarettes. He has a 20 pack-year smoking history. His smokeless tobacco use includes Chew. He reports that he does not drink alcohol or use illicit drugs.   Family History:  The patient's family history includes Appendicitis in his mother; Early death in an other family member; Heart attack (age of onset: 61) in his father.   ROS:  Please see the history of present illness.      All other systems reviewed  and negative.   PHYSICAL EXAM: VS:  BP 113/61 mmHg  Pulse 79  Temp(Src) 98.2 F (36.8 C) (Oral)  Resp 18  Ht 5\' 10"  (1.778 m)  Wt 177 lb 4 oz (80.4 kg)  BMI 25.43 kg/m2  SpO2 98% Well nourished, well developed, in no acute distress HEENT: normal Neck: no JVD Cardiac:  normal S1, S2; RRR;  no murmur Lungs:  clear to auscultation bilaterally, no wheezing, rhonchi or rales Abd: soft, nontender, no hepatomegaly Ext: no edema Skin: warm and dry Neuro:  CNs 2-12 intact, no focal abnormalities noted  EKG:     NSR with T wave inversions in the inferolateral leads  ASSESSMENT:  1. Acute chest pain that is unclear whether related to underlying coronary ischemia or GERD.  Initial cardiac enzymes are negative.  His initial EKG on admit to ER showed T wave inversions in the inferolateral leads which are similar to what he has had on other EKGs but this improved on second EKG.  He is currently pain free. 2. ASCAD with extensive history of multiple PCI's after CABG.  He says he has not had any significant angina in the past year until the last month but again is unsure if his discomfort is related to GERD or CAD. 3. Type II DM 4. HTN well controlled 5. Dyslipidemia  PLAN: 1.  Admit to tele bed 2.  Cycle cardiac enzymes 3.  IV Heparin gtt until rule out is complete 4.  Continue ASA/statin/BB/ACE I/Brilinta 5.  Hold Metformin 6.  Add NTP 1' ACW q 6 hours 7.  NPO after MN for  for possible cath vs. Nuclear stress test in am ( he does not want another stress test)  Signed, Fransico Him, MD [facility] 05/19/2014 2:44 AM

## 2014-05-19 NOTE — Progress Notes (Deleted)
Notified Dr Johnsie Cancel, that patient's  Cr today on istat was 2.3.  Ok to procedure per Dr Johnsie Cancel, no LV, cors only

## 2014-05-19 NOTE — Plan of Care (Signed)
Problem: Phase I Progression Outcomes Goal: Pain controlled with appropriate interventions Outcome: Completed/Met Date Met:  05/19/14 Goal: Voiding-avoid urinary catheter unless indicated Outcome: Completed/Met Date Met:  05/19/14 Goal: Hemodynamically stable Outcome: Completed/Met Date Met:  05/19/14

## 2014-05-20 LAB — COMPREHENSIVE METABOLIC PANEL
ALK PHOS: 65 U/L (ref 39–117)
ALT: 18 U/L (ref 0–53)
AST: 19 U/L (ref 0–37)
Albumin: 3 g/dL — ABNORMAL LOW (ref 3.5–5.2)
Anion gap: 11 (ref 5–15)
BUN: 13 mg/dL (ref 6–23)
CALCIUM: 8.5 mg/dL (ref 8.4–10.5)
CO2: 23 mEq/L (ref 19–32)
Chloride: 103 mEq/L (ref 96–112)
Creatinine, Ser: 0.95 mg/dL (ref 0.50–1.35)
GFR calc non Af Amer: 75 mL/min — ABNORMAL LOW (ref >90)
GFR, EST AFRICAN AMERICAN: 87 mL/min — AB (ref >90)
GLUCOSE: 175 mg/dL — AB (ref 70–99)
POTASSIUM: 3.9 meq/L (ref 3.7–5.3)
SODIUM: 137 meq/L (ref 137–147)
TOTAL PROTEIN: 5.5 g/dL — AB (ref 6.0–8.3)
Total Bilirubin: 0.3 mg/dL (ref 0.3–1.2)

## 2014-05-20 LAB — CBC WITH DIFFERENTIAL/PLATELET
Basophils Absolute: 0 10*3/uL (ref 0.0–0.1)
Basophils Relative: 0 % (ref 0–1)
EOS ABS: 0.2 10*3/uL (ref 0.0–0.7)
Eosinophils Relative: 3 % (ref 0–5)
HCT: 34.5 % — ABNORMAL LOW (ref 39.0–52.0)
HEMOGLOBIN: 11.5 g/dL — AB (ref 13.0–17.0)
LYMPHS ABS: 1 10*3/uL (ref 0.7–4.0)
Lymphocytes Relative: 17 % (ref 12–46)
MCH: 31.7 pg (ref 26.0–34.0)
MCHC: 33.3 g/dL (ref 30.0–36.0)
MCV: 95 fL (ref 78.0–100.0)
MONO ABS: 0.6 10*3/uL (ref 0.1–1.0)
MONOS PCT: 9 % (ref 3–12)
NEUTROS PCT: 71 % (ref 43–77)
Neutro Abs: 4.4 10*3/uL (ref 1.7–7.7)
Platelets: 130 10*3/uL — ABNORMAL LOW (ref 150–400)
RBC: 3.63 MIL/uL — ABNORMAL LOW (ref 4.22–5.81)
RDW: 12.9 % (ref 11.5–15.5)
WBC: 6.2 10*3/uL (ref 4.0–10.5)

## 2014-05-20 LAB — GLUCOSE, CAPILLARY
GLUCOSE-CAPILLARY: 210 mg/dL — AB (ref 70–99)
GLUCOSE-CAPILLARY: 195 mg/dL — AB (ref 70–99)
Glucose-Capillary: 183 mg/dL — ABNORMAL HIGH (ref 70–99)
Glucose-Capillary: 217 mg/dL — ABNORMAL HIGH (ref 70–99)
Glucose-Capillary: 211 mg/dL — ABNORMAL HIGH (ref 70–99)

## 2014-05-20 NOTE — Plan of Care (Signed)
Problem: Phase I Progression Outcomes Goal: OOB as tolerated unless otherwise ordered Outcome: Progressing     

## 2014-05-20 NOTE — Progress Notes (Signed)
Patient ID: MELQUIADES KOVAR, male   DOB: [birth year], 80 y.o.   MRN: [id number]         Subjective: No further pain or SOB, feels better worried about his labile BP   Objective: Vital signs in last 24 hours: Temp:  [97.8 F (36.6 C)-98.2 F (36.8 C)] 97.9 F (36.6 C) (11/07 0426) Pulse Rate:  [60-78] 74 (11/07 1029) Resp:  [7-19] 18 (11/07 1029) BP: (81-144)/(41-73) 104/59 mmHg (11/07 1029) SpO2:  [93 %-99 %] 95 % (11/07 1029) Weight change:  Last BM Date: 05/18/14 Intake/Output from previous day: -250 11/06 0701 - 11/07 0700 In: 1179.3 [P.O.:720; I.V.:459.3] Out: 1375 [Urine:1375] Intake/Output this shift: Total I/O In: 240 [P.O.:240] Out: 550 [Urine:550]  PE: General:Pleasant affect, NAD Skin:Warm and dry, brisk capillary refill HEENT:normocephalic, sclera clear, mucus membranes moist Neck:supple, no JVD Heart:S1S2 RRR without murmur, gallup, rub or click Lungs:clear ant. without rales, rhonchi, or wheezes HMC:NOBS, mild epigastric tenderness, + BS, do not palpate liver spleen or masses Ext:no lower ext edema, 2+ pedal pulses, 2+ radial pulses Neuro:alert and oriented, MAE, follows commands, + facial symmetry EKG:  Less inf lat ischemia than on Dane EKG TELE :  SR   Lab Results:  Recent Labs  05/19/14 0404 05/20/14 0445  WBC 7.8 6.2  HGB 13.2 11.5*  HCT 38.6* 34.5*  PLT 130* 130*   BMET  Recent Labs  05/19/14 0404 05/20/14 0445  NA 140 137  K 4.5 3.9  CL 102 103  CO2 24 23  GLUCOSE 196* 175*  BUN 17 13  CREATININE 0.93 0.95  CALCIUM 8.9 8.5    Recent Labs  05/18/14 2053 05/19/14 0930  TROPONINI <0.30 <0.30      Hepatic Function Panel  Recent Labs  05/20/14 0445  PROT 5.5*  ALBUMIN 3.0*  AST 19  ALT 18  ALKPHOS 65  BILITOT 0.3       Studies/Results: Dg Chest Portable 1 View  05/18/2014   CLINICAL DATA:  Chest pain and diaphoresis.  Weakness  EXAM: PORTABLE CHEST - 1 VIEW  COMPARISON:  05/06/2014  FINDINGS: The  patient is status post median sternotomy and CABG procedure. Normal heart size. No pleural effusion or edema. No airspace consolidation.  IMPRESSION: 1. No active cardiopulmonary abnormalities.   Electronically Signed   By: Kerby Moors M.D.   On: 05/18/2014 18:33    Medications: I have reviewed the patient's current medications. Scheduled Meds: . aspirin  81 mg Oral Daily  . atorvastatin  40 mg Oral q1800  . busPIRone  5 mg Oral BID  . carvedilol  6.25 mg Oral BID WC  . heparin  5,000 Units Subcutaneous 3 times per day  . insulin aspart  0-9 Units Subcutaneous 6 times per day  . isosorbide mononitrate  60 mg Oral Daily  . lisinopril  2.5 mg Oral BID  . multivitamin with minerals  1 tablet Oral Daily  . pantoprazole  40 mg Oral Daily  . ranolazine  1,000 mg Oral BID  . sodium chloride  3 mL Intravenous Q12H  . tamsulosin  0.4 mg Oral QPC supper  . ticagrelor  90 mg Oral BID   Continuous Infusions: . sodium chloride Stopped (05/20/14 0144)   PRN Meds:.acetaminophen, albuterol, morphine injection, nitroGLYCERIN, nitroGLYCERIN, ondansetron (ZOFRAN) IV  Assessment/Plan: 80 y.o. male with a history of ASCAD s/p CABG x 4 in 1989 (VG->OM1->OM2, VG->RCA, LIMA->LAD), 05/2010: DES to VG-OM1/OM2, DES to distal LCx., NSTEMI in 04/2011 - TO distal LCX stent  and VG->OM2.,. 02/2012 NSTEMI DES to VG-OM1/continuation to OM2 occluded., inferior STEMI s/p DES to SVG-RAMUS 06/2012, inferolat STEMI 09/2012 s/p DES to SVG-interm and Lex MV EF 35%, inf-lat scar with small peri-infarct ischemia 11/14 on medical management, HTN, type II DM, dyslipidemia and CVA who presented to [facility] with complaints of chest pain. Admitted to rule out, second troponin has not been drawn, have reordered.  Pt does not want stress test he would prefer cath.  Initial EKG showing shows persistent T-wave inversions in the anterior lateral as well as some inferior leads.repeat EKG shows similar pattern though somewhat improved.  Patient currently denies any significant chest pain He denies any recent SOB, PND or orthopnea. He denies any LE edema. He says that he cannot tell the difference between his GERD and CAD pain.     CAD:  Cath yesterday.  Since the patient's last catheterization. His previously restenotic SVG to OM1/OM2, which was intervened upon is now completely occluded. He now has improved collaterals to a circumflex marginal vessel via the LIMA to LAD distal collaterals. His RCA is occluded as is the graft but moderate collaterals exist both from septal perforating arteries as well as the LIMA to LAD. An increased medical regimen will be initially recommended with further titration of Ranexa from 500 mg twice a day to 1000 g twice a day, addition of oral nitrate therapy, and slight titration of his carvedilol to 6.25 mg twice a day.  Ambulate  D/c when not postural and BP ok

## 2014-05-21 ENCOUNTER — Encounter (HOSPITAL_COMMUNITY): Payer: Self-pay | Admitting: Cardiology

## 2014-05-21 LAB — CBC WITH DIFFERENTIAL/PLATELET
Basophils Absolute: 0 10*3/uL (ref 0.0–0.1)
Basophils Relative: 0 % (ref 0–1)
Eosinophils Absolute: 0.2 10*3/uL (ref 0.0–0.7)
Eosinophils Relative: 4 % (ref 0–5)
HCT: 36.5 % — ABNORMAL LOW (ref 39.0–52.0)
HEMOGLOBIN: 12.5 g/dL — AB (ref 13.0–17.0)
LYMPHS ABS: 1 10*3/uL (ref 0.7–4.0)
Lymphocytes Relative: 19 % (ref 12–46)
MCH: 32.2 pg (ref 26.0–34.0)
MCHC: 34.2 g/dL (ref 30.0–36.0)
MCV: 94.1 fL (ref 78.0–100.0)
MONOS PCT: 12 % (ref 3–12)
Monocytes Absolute: 0.6 10*3/uL (ref 0.1–1.0)
NEUTROS PCT: 65 % (ref 43–77)
Neutro Abs: 3.4 10*3/uL (ref 1.7–7.7)
PLATELETS: 126 10*3/uL — AB (ref 150–400)
RBC: 3.88 MIL/uL — AB (ref 4.22–5.81)
RDW: 12.9 % (ref 11.5–15.5)
WBC: 5.2 10*3/uL (ref 4.0–10.5)

## 2014-05-21 LAB — COMPREHENSIVE METABOLIC PANEL
ALT: 23 U/L (ref 0–53)
AST: 26 U/L (ref 0–37)
Albumin: 3.3 g/dL — ABNORMAL LOW (ref 3.5–5.2)
Alkaline Phosphatase: 71 U/L (ref 39–117)
Anion gap: 12 (ref 5–15)
BUN: 11 mg/dL (ref 6–23)
CO2: 23 meq/L (ref 19–32)
CREATININE: 0.85 mg/dL (ref 0.50–1.35)
Calcium: 8.8 mg/dL (ref 8.4–10.5)
Chloride: 104 mEq/L (ref 96–112)
GFR, EST NON AFRICAN AMERICAN: 78 mL/min — AB (ref >90)
Glucose, Bld: 183 mg/dL — ABNORMAL HIGH (ref 70–99)
Potassium: 4.1 mEq/L (ref 3.7–5.3)
SODIUM: 139 meq/L (ref 137–147)
TOTAL PROTEIN: 5.9 g/dL — AB (ref 6.0–8.3)
Total Bilirubin: 0.4 mg/dL (ref 0.3–1.2)

## 2014-05-21 LAB — GLUCOSE, CAPILLARY
GLUCOSE-CAPILLARY: 166 mg/dL — AB (ref 70–99)
Glucose-Capillary: 223 mg/dL — ABNORMAL HIGH (ref 70–99)

## 2014-05-21 MED ORDER — RANOLAZINE ER 1000 MG PO TB12: 1000.0000 mg | ORAL_TABLET | Freq: Two times a day (BID) | ORAL | Status: DC

## 2014-05-21 MED ORDER — ISOSORBIDE MONONITRATE ER 60 MG PO TB24: 60.0000 mg | ORAL_TABLET | Freq: Every day | ORAL | Status: DC

## 2014-05-21 MED ORDER — METFORMIN HCL 500 MG PO TABS
1000.0000 mg | ORAL_TABLET | Freq: Two times a day (BID) | ORAL | Status: DC
Start: 2014-05-22 — End: 2015-03-03

## 2014-05-21 MED ORDER — ACETAMINOPHEN 325 MG PO TABS: 650.0000 mg | ORAL_TABLET | ORAL | Status: AC | PRN

## 2014-05-21 NOTE — Progress Notes (Signed)
Patient ID: Jeffrey [name], male   DOB: 11/21/1928, 80 y.o.   MRN: [id number]    Subjective: No further pain or SOB, feels better BP less postural   Objective: Vital signs in last 24 hours: Temp:  [97.8 F (36.6 C)-98.4 F (36.9 C)] 98.2 F (36.8 C) (11/08 0337) Pulse Rate:  [67-77] 70 (11/08 0337) Resp:  [18] 18 (11/08 0337) BP: (101-128)/(52-59) 124/56 mmHg (11/08 0337) SpO2:  [95 %-100 %] 97 % (11/08 0337) Weight change:  Last BM Date: 05/18/14 Intake/Output from previous day: -250 11/07 0701 - 11/08 0700 In: 840 [P.O.:840] Out: 2150 [Urine:2150] Intake/Output this shift:    PE: General:Pleasant affect, NAD Skin:Warm and dry, brisk capillary refill HEENT:normocephalic, sclera clear, mucus membranes moist Neck:supple, no JVD Heart:S1S2 RRR without murmur, gallup, rub or click Lungs:clear ant. without rales, rhonchi, or wheezes WFU:XNAT, mild epigastric tenderness, + BS, do not palpate liver spleen or masses Ext:no lower ext edema, 2+ pedal pulses, 2+ radial pulses Neuro:alert and oriented, MAE, follows commands, + facial symmetry   EKG:  Less inf lat ischemia than on [place] EKG  TELE :  SR no arrhythmia  Lab Results:  Recent Labs  05/20/14 0445 05/21/14 0456  WBC 6.2 5.2  HGB 11.5* 12.5*  HCT 34.5* 36.5*  PLT 130* 126*   BMET  Recent Labs  05/20/14 0445 05/21/14 0456  NA 137 139  K 3.9 4.1  CL 103 104  CO2 23 23  GLUCOSE 175* 183*  BUN 13 11  CREATININE 0.95 0.85  CALCIUM 8.5 8.8    Recent Labs  05/18/14 2053 05/19/14 0930  TROPONINI <0.30 <0.30      Hepatic Function Panel  Recent Labs  05/21/14 0456  PROT 5.9*  ALBUMIN 3.3*  AST 26  ALT 23  ALKPHOS 71  BILITOT 0.4       Studies/Results: No results found.  Medications: I have reviewed the patient's current medications. Scheduled Meds: . aspirin  81 mg Oral Daily  . atorvastatin  40 mg Oral q1800  . busPIRone  5 mg Oral BID  . carvedilol  6.25 mg Oral BID WC  .  heparin  5,000 Units Subcutaneous 3 times per day  . insulin aspart  0-9 Units Subcutaneous 6 times per day  . isosorbide mononitrate  60 mg Oral Daily  . lisinopril  2.5 mg Oral BID  . multivitamin with minerals  1 tablet Oral Daily  . pantoprazole  40 mg Oral Daily  . ranolazine  1,000 mg Oral BID  . sodium chloride  3 mL Intravenous Q12H  . tamsulosin  0.4 mg Oral QPC supper  . ticagrelor  90 mg Oral BID   Continuous Infusions: . sodium chloride Stopped (05/20/14 0144)   PRN Meds:.acetaminophen, albuterol, morphine injection, nitroGLYCERIN, nitroGLYCERIN, ondansetron (ZOFRAN) IV  Assessment/Plan: 80 y.o. male with a history of ASCAD s/p CABG x 4 in 1989 (VG->OM1->OM2, VG->RCA, LIMA->LAD), 05/2010: DES to VG-OM1/OM2, DES to distal LCx., NSTEMI in 04/2011 - TO distal LCX stent and VG->OM2.,. 02/2012 NSTEMI DES to VG-OM1/continuation to OM2 occluded., inferior STEMI s/p DES to SVG-RAMUS 06/2012, inferolat STEMI 09/2012 s/p DES to SVG-interm and Lex MV EF 35%, inf-lat scar with small peri-infarct ischemia 11/14 on medical management, HTN, type II DM, dyslipidemia and CVA who presented to [facility] with complaints of chest pain. Admitted to rule out, second troponin has not been drawn, have reordered.  Pt does not want stress test he would prefer cath.  Initial EKG showing shows  persistent T-wave inversions in the anterior lateral as well as some inferior leads.repeat EKG shows similar pattern though somewhat improved. Patient currently denies any significant chest pain He denies any recent SOB, PND or orthopnea. He denies any LE edema. He says that he cannot tell the difference between his GERD and CAD pain.     CAD:  Cath 11/6 reviewed  His previously restenotic SVG to OM1/OM2, which was intervened upon is now completely occluded. He now has improved collaterals to a circumflex marginal vessel via the LIMA to LAD distal collaterals. His RCA is occluded as is the graft but moderate  collaterals exist both from septal perforating arteries as well as the LIMA to LAD. An increased medical regimen will be initially recommended with further titration of Ranexa from 500 mg twice a day to 1000 g twice a day, addition of oral nitrate therapy, and slight titration of his carvedilol to 6.25 mg twice a day.  D/C home today f/u Dr Aubery Lapping

## 2014-05-21 NOTE — Discharge Instructions (Signed)
Arteriogram ° An arteriogram (or angiogram) is an X-ray test of your blood vessels. You will be awake during the test. This test looks for: °· Blocked blood vessels. °· Blood vessels that are not normal. °BEFORE THE TEST °Schedule an appointment for your arteriogram. Let the person know if: °· You have diabetes. °· You are allergic to any food or medicine. °· You are allergic to X-ray dye (contrast). °· You have asthma or had it when you were a child. °· You are pregnant or you might be pregnant. °· You are taking aspirin or blood thinners. °The night before the test:  °· Do not  eat or drink anything after midnight. °On the morning of your test:  °· Do not drive. Have someone bring you and take you home. °· Bring all the medicines you need to take with you. If you have diabetes, do not take your insulin before the test, but bring it with you. °THE TEST °· You will lie on the X-ray table. °· An IV tube is started in your arm. °· Your blood pressure and heartbeat are checked during the test. °· The upper part of your leg (groin) is shaved and washed with special soap. °· You are then covered with a germ free (sterile) sheet. Keep your arms at your side under the sheet at all times so that germs do not get on the sheet. °· The skin is numbed where the thin tube (catheter) is put into your upper leg. The thin tube is moved up into the blood vessels that your doctor wants to see. °· Dye is put in through the tube. You may have a feeling of heat as the dye moves into your body. °· X-ray pictures of your blood vessels are taken. Do not move while the dye goes in and pictures are taken. °AFTER THE TEST °· The thin tube will be taken out of your upper leg. °· The nurse will check: °¨ Your blood pressure. °¨ The place where the thin tube was taken out to make sure it is not bleeding. °¨ The blood flow to your feet. °· Lie flat in bed. Keep your leg straight for 6 hours after the thin tube is taken out. °· Ask your doctor or  nurse if you should take your regular medications that you brought. °Finding out the results of your test °Ask when your test results will be ready. Make sure you get your test results. °Document Released: 09/26/2008 Document Revised: 07/05/2013 Document Reviewed: 09/26/2008 °[employer]® Patient Information ©2015 [employer]. This information is not intended to replace advice given to you by your health care provider. Make sure you discuss any questions you have with your health care provider. ° °

## 2014-05-21 NOTE — Plan of Care (Signed)
Problem: Phase I Progression Outcomes Goal: OOB as tolerated unless otherwise ordered Outcome: Progressing     

## 2014-05-21 NOTE — Progress Notes (Signed)
Given due to SOB

## 2014-05-21 NOTE — Progress Notes (Signed)
Discharge education completed by RN. Pt and son-in-law  received a copy of discharge paperwork. Pt confirms understanding of follow up appointments and discharge medications.  Both deny any questions at this time. IV removed, site is within normal limits. Pt will discharge from the unit via wheelchair.

## 2014-05-21 NOTE — Discharge Summary (Signed)
Patient ID: Jeffrey [name],  MRN: [id number], DOB/AGE: 18-May-1929 80 y.o.  Admit date: 05/18/2014 Discharge date: 05/21/2014  Primary Care Provider: [name]., MD Primary Cardiologist: Dr Jacinta Shoe  Discharge Diagnoses Principal Problem:   Unstable angina Active Problems:   Hyperlipidemia   Arteriosclerotic cardiovascular disease (ASCVD)   Diabetes mellitus, type II   Hypertension   Cardiomyopathy, ischemic   Coronary atherosclerosis of native coronary artery   Chest pain    Procedures:  Coronary angiogram 05/19/14   Hospital Course:  80 y/o male with a history of CABG x 4 in 1989. He has PCI Oct 2012, Aug 2013, Dec 2013, and March 2014. He presented 05/18/14 with unstable angina. Troponin was negative. Cath done on 05/19/14 reveled occlusion of his previously intervened on SVG-OM1/OM2. He did have improved collaterals via the LIMA-LAD. His native RCA and graft were occluded but he also had collaterals from the LIMA-LAD. His EF aqt cath was 35% but an echo showed his EF to be 45-50%. The plan is for medical Rx. He was transferred to the floor and ambulated. He did have some issues with orthostatic B/P and was kept an extra 24 hrs. Dr Johnsie Cancel saw him the morning of the 8 th and feels he is ready for discharge. He has an appointment in [place] for Dec 9th.    Discharge Vitals:  Blood pressure 124/56, pulse 70, temperature 98.2 F (36.8 C), temperature source Oral, resp. rate 18, height 5\' 10"  (1.778 m), weight 177 lb 4 oz (80.4 kg), SpO2 97 %.    Labs: Results for orders placed or performed during the hospital encounter of 05/18/14 (from the past 24 hour(s))  Glucose, capillary     Status: Abnormal   Collection Time: 05/20/14  4:20 PM  Result Value Ref Range   Glucose-Capillary 211 (H) 70 - 99 mg/dL   Comment 1 Notify RN    Comment 2 Documented in Chart   Glucose, capillary     Status: Abnormal   Collection Time: 05/20/14  9:18 PM  Result Value Ref Range   Glucose-Capillary 195 (H) 70 - 99 mg/dL  Comprehensive metabolic panel     Status: Abnormal   Collection Time: 05/21/14  4:56 AM  Result Value Ref Range   Sodium 139 137 - 147 mEq/L   Potassium 4.1 3.7 - 5.3 mEq/L   Chloride 104 96 - 112 mEq/L   CO2 23 19 - 32 mEq/L   Glucose, Bld 183 (H) 70 - 99 mg/dL   BUN 11 6 - 23 mg/dL   Creatinine, Ser 0.85 0.50 - 1.35 mg/dL   Calcium 8.8 8.4 - 10.5 mg/dL   Total Protein 5.9 (L) 6.0 - 8.3 g/dL   Albumin 3.3 (L) 3.5 - 5.2 g/dL   AST 26 0 - 37 U/L   ALT 23 0 - 53 U/L   Alkaline Phosphatase 71 39 - 117 U/L   Total Bilirubin 0.4 0.3 - 1.2 mg/dL   GFR calc non Af Amer 78 (L) >90 mL/min   GFR calc Af Amer >90 >90 mL/min   Anion gap 12 5 - 15  CBC WITH DIFFERENTIAL     Status: Abnormal   Collection Time: 05/21/14  4:56 AM  Result Value Ref Range   WBC 5.2 4.0 - 10.5 K/uL   RBC 3.88 (L) 4.22 - 5.81 MIL/uL   Hemoglobin 12.5 (L) 13.0 - 17.0 g/dL   HCT 36.5 (L) 39.0 - 52.0 %   MCV 94.1 78.0 - 100.0  fL   MCH 32.2 26.0 - 34.0 pg   MCHC 34.2 30.0 - 36.0 g/dL   RDW 12.9 11.5 - 15.5 %   Platelets 126 (L) 150 - 400 K/uL   Neutrophils Relative % 65 43 - 77 %   Neutro Abs 3.4 1.7 - 7.7 K/uL   Lymphocytes Relative 19 12 - 46 %   Lymphs Abs 1.0 0.7 - 4.0 K/uL   Monocytes Relative 12 3 - 12 %   Monocytes Absolute 0.6 0.1 - 1.0 K/uL   Eosinophils Relative 4 0 - 5 %   Eosinophils Absolute 0.2 0.0 - 0.7 K/uL   Basophils Relative 0 0 - 1 %   Basophils Absolute 0.0 0.0 - 0.1 K/uL  Glucose, capillary     Status: Abnormal   Collection Time: 05/21/14  6:09 AM  Result Value Ref Range   Glucose-Capillary 166 (H) 70 - 99 mg/dL    Disposition:  Follow-up Information    Follow up with Herminio Commons, MD On 06/21/2014.   Specialty:  Cardiology   Why:  1:40 pm   Contact information:   [place] [address] [phone]       Discharge Medications:    Medication List    TAKE these medications        acetaminophen 325 MG tablet    Commonly known as:  TYLENOL  Take 2 tablets (650 mg total) by mouth every 4 (four) hours as needed for headache or mild pain.     albuterol 108 (90 BASE) MCG/ACT inhaler  Commonly known as:  PROVENTIL HFA;VENTOLIN HFA  Inhale 2 puffs into the lungs every 6 (six) hours as needed for wheezing or shortness of breath.     aspirin 81 MG tablet  Take 81 mg by mouth daily.     atorvastatin 40 MG tablet  Commonly known as:  LIPITOR  Take 1 tablet (40 mg total) by mouth daily at 6 PM.     bismuth subsalicylate 295 JO/84ZY suspension  Commonly known as:  PEPTO BISMOL  Take 30 mLs by mouth every 6 (six) hours as needed for indigestion.     busPIRone 5 MG tablet  Commonly known as:  BUSPAR  Take 5 mg by mouth 2 (two) times daily.     carvedilol 3.125 MG tablet  Commonly known as:  COREG  Take 3.125 mg by mouth 2 (two) times daily with a meal.     furosemide 20 MG tablet  Commonly known as:  LASIX  Take 20 mg by mouth daily as needed for fluid. Take 1 tablet as needed if 3 lb weight gain.     isosorbide mononitrate 60 MG 24 hr tablet  Commonly known as:  IMDUR  Take 1 tablet (60 mg total) by mouth daily.     lisinopril 2.5 MG tablet  Commonly known as:  PRINIVIL,ZESTRIL  Take 2.5 mg by mouth 2 (two) times daily.     metFORMIN 500 MG tablet  Commonly known as:  GLUCOPHAGE  Take 1,000 mg by mouth 2 (two) times daily with a meal.     multivitamin with minerals Tabs tablet  Take 1 tablet by mouth daily.     nitroGLYCERIN 0.4 MG SL tablet  Commonly known as:  NITROSTAT  Place 0.4 mg under the tongue every 5 (five) minutes as needed for chest pain.     pantoprazole 40 MG tablet  Commonly known as:  PROTONIX  Take 40 mg by mouth daily.  potassium chloride SA 20 MEQ tablet  Commonly known as:  K-DUR,KLOR-CON  Take 20 mEq by mouth daily as needed (Takes only with Lasix).     ranolazine 1000 MG SR tablet  Commonly known as:  RANEXA  Take 1 tablet (1,000 mg total) by mouth 2  (two) times daily.     tamsulosin 0.4 MG Caps capsule  Commonly known as:  FLOMAX  Take 0.4 mg by mouth daily after supper.     ticagrelor 90 MG Tabs tablet  Commonly known as:  BRILINTA  Take 1 tablet (90 mg total) by mouth 2 (two) times daily.         Duration of Discharge Encounter: Greater than 30 minutes including physician time.  Angelena Form PA-C 05/21/2014 11:17 AM

## 2014-05-25 ENCOUNTER — Telehealth: Payer: Self-pay | Admitting: *Deleted

## 2014-05-25 NOTE — Telephone Encounter (Signed)
Pt come in office this morning and thinks that one of his medications is to high of a dose, he states that he feels worse then ever since it was increased. Pt doesn't know what medication it is.

## 2014-05-25 NOTE — Telephone Encounter (Signed)
Pt needed clarification of Imdur and Ranexa dose,he understands to take both  I noted he is only taking 1/2 dose of metformin as prescribed.i asked him to speak with Dr.Fusco about this

## 2014-06-01 ENCOUNTER — Emergency Department (HOSPITAL_COMMUNITY): Payer: Medicare Other

## 2014-06-01 ENCOUNTER — Encounter (HOSPITAL_COMMUNITY): Payer: Self-pay

## 2014-06-01 ENCOUNTER — Observation Stay (HOSPITAL_COMMUNITY)
Admission: EM | Admit: 2014-06-01 | Discharge: 2014-06-03 | Disposition: A | Discharge status: Home / Self Care | Payer: Medicare Other | Attending: Internal Medicine | Admitting: Internal Medicine

## 2014-06-01 DIAGNOSIS — M1990 Unspecified osteoarthritis, unspecified site: Secondary | ICD-10-CM | POA: Insufficient documentation

## 2014-06-01 DIAGNOSIS — I10 Essential (primary) hypertension: Secondary | ICD-10-CM | POA: Diagnosis not present

## 2014-06-01 DIAGNOSIS — K219 Gastro-esophageal reflux disease without esophagitis: Secondary | ICD-10-CM | POA: Insufficient documentation

## 2014-06-01 DIAGNOSIS — Z87891 Personal history of nicotine dependence: Secondary | ICD-10-CM | POA: Insufficient documentation

## 2014-06-01 DIAGNOSIS — Z8711 Personal history of peptic ulcer disease: Secondary | ICD-10-CM | POA: Insufficient documentation

## 2014-06-01 DIAGNOSIS — Z8781 Personal history of (healed) traumatic fracture: Secondary | ICD-10-CM | POA: Diagnosis not present

## 2014-06-01 DIAGNOSIS — I2510 Atherosclerotic heart disease of native coronary artery without angina pectoris: Secondary | ICD-10-CM | POA: Diagnosis not present

## 2014-06-01 DIAGNOSIS — R110 Nausea: Secondary | ICD-10-CM | POA: Diagnosis not present

## 2014-06-01 DIAGNOSIS — I255 Ischemic cardiomyopathy: Secondary | ICD-10-CM

## 2014-06-01 DIAGNOSIS — G8929 Other chronic pain: Secondary | ICD-10-CM | POA: Insufficient documentation

## 2014-06-01 DIAGNOSIS — Z951 Presence of aortocoronary bypass graft: Secondary | ICD-10-CM | POA: Diagnosis not present

## 2014-06-01 DIAGNOSIS — E119 Type 2 diabetes mellitus without complications: Secondary | ICD-10-CM

## 2014-06-01 DIAGNOSIS — Z9889 Other specified postprocedural states: Secondary | ICD-10-CM | POA: Insufficient documentation

## 2014-06-01 DIAGNOSIS — N400 Enlarged prostate without lower urinary tract symptoms: Secondary | ICD-10-CM | POA: Diagnosis not present

## 2014-06-01 DIAGNOSIS — Z7982 Long term (current) use of aspirin: Secondary | ICD-10-CM | POA: Insufficient documentation

## 2014-06-01 DIAGNOSIS — R079 Chest pain, unspecified: Principal | ICD-10-CM | POA: Insufficient documentation

## 2014-06-01 DIAGNOSIS — Z8701 Personal history of pneumonia (recurrent): Secondary | ICD-10-CM | POA: Insufficient documentation

## 2014-06-01 DIAGNOSIS — R42 Dizziness and giddiness: Secondary | ICD-10-CM

## 2014-06-01 DIAGNOSIS — Z79899 Other long term (current) drug therapy: Secondary | ICD-10-CM | POA: Insufficient documentation

## 2014-06-01 DIAGNOSIS — Z8673 Personal history of transient ischemic attack (TIA), and cerebral infarction without residual deficits: Secondary | ICD-10-CM | POA: Diagnosis not present

## 2014-06-01 DIAGNOSIS — E785 Hyperlipidemia, unspecified: Secondary | ICD-10-CM

## 2014-06-01 DIAGNOSIS — E118 Type 2 diabetes mellitus with unspecified complications: Secondary | ICD-10-CM

## 2014-06-01 LAB — COMPREHENSIVE METABOLIC PANEL
ALBUMIN: 3.9 g/dL (ref 3.5–5.2)
ALT: 24 U/L (ref 0–53)
ANION GAP: 13 (ref 5–15)
AST: 21 U/L (ref 0–37)
Alkaline Phosphatase: 86 U/L (ref 39–117)
BILIRUBIN TOTAL: 0.5 mg/dL (ref 0.3–1.2)
BUN: 21 mg/dL (ref 6–23)
CO2: 25 meq/L (ref 19–32)
CREATININE: 1.04 mg/dL (ref 0.50–1.35)
Calcium: 9.7 mg/dL (ref 8.4–10.5)
Chloride: 98 mEq/L (ref 96–112)
GFR calc Af Amer: 75 mL/min — ABNORMAL LOW (ref >90)
GFR, EST NON AFRICAN AMERICAN: 64 mL/min — AB (ref >90)
Glucose, Bld: 222 mg/dL — ABNORMAL HIGH (ref 70–99)
Potassium: 4.9 mEq/L (ref 3.7–5.3)
Sodium: 136 mEq/L — ABNORMAL LOW (ref 137–147)
Total Protein: 7.1 g/dL (ref 6.0–8.3)

## 2014-06-01 LAB — URINALYSIS, ROUTINE W REFLEX MICROSCOPIC
Bilirubin Urine: NEGATIVE
GLUCOSE, UA: NEGATIVE mg/dL
Hgb urine dipstick: NEGATIVE
Ketones, ur: NEGATIVE mg/dL
LEUKOCYTES UA: NEGATIVE
Nitrite: NEGATIVE
PROTEIN: NEGATIVE mg/dL
Specific Gravity, Urine: 1.015 (ref 1.005–1.030)
Urobilinogen, UA: 0.2 mg/dL (ref 0.0–1.0)
pH: 7 (ref 5.0–8.0)

## 2014-06-01 LAB — CBC WITH DIFFERENTIAL/PLATELET
BASOS ABS: 0 10*3/uL (ref 0.0–0.1)
BASOS PCT: 0 % (ref 0–1)
EOS ABS: 0.2 10*3/uL (ref 0.0–0.7)
EOS PCT: 3 % (ref 0–5)
HEMATOCRIT: 41.3 % (ref 39.0–52.0)
Hemoglobin: 13.9 g/dL (ref 13.0–17.0)
Lymphocytes Relative: 16 % (ref 12–46)
Lymphs Abs: 1.3 10*3/uL (ref 0.7–4.0)
MCH: 32.6 pg (ref 26.0–34.0)
MCHC: 33.7 g/dL (ref 30.0–36.0)
MCV: 96.9 fL (ref 78.0–100.0)
MONO ABS: 0.7 10*3/uL (ref 0.1–1.0)
Monocytes Relative: 9 % (ref 3–12)
Neutro Abs: 5.9 10*3/uL (ref 1.7–7.7)
Neutrophils Relative %: 72 % (ref 43–77)
PLATELETS: 194 10*3/uL (ref 150–400)
RBC: 4.26 MIL/uL (ref 4.22–5.81)
RDW: 12.7 % (ref 11.5–15.5)
WBC: 8.1 10*3/uL (ref 4.0–10.5)

## 2014-06-01 LAB — GLUCOSE, CAPILLARY
Glucose-Capillary: 158 mg/dL — ABNORMAL HIGH (ref 70–99)
Glucose-Capillary: 120 mg/dL — ABNORMAL HIGH (ref 70–99)

## 2014-06-01 LAB — TROPONIN I: Troponin I: <0.3 ng/mL (ref <0.30)

## 2014-06-01 MED ORDER — ALBUTEROL SULFATE HFA 108 (90 BASE) MCG/ACT IN AERS: 2.0000 | INHALATION_SPRAY | Freq: Four times a day (QID) | RESPIRATORY_TRACT | Status: DC | PRN

## 2014-06-01 MED ORDER — PANTOPRAZOLE SODIUM 40 MG PO TBEC
40.0000 mg | DELAYED_RELEASE_TABLET | Freq: Every day | ORAL | Status: DC
  Administered 2014-06-02 – 2014-06-03 (×2): 40 mg via ORAL
  Filled 2014-06-01 (×2): qty 1

## 2014-06-01 MED ORDER — INSULIN ASPART 100 UNIT/ML ~~LOC~~ SOLN
0.0000 [IU] | Freq: Three times a day (TID) | SUBCUTANEOUS | Status: DC
  Administered 2014-06-01 – 2014-06-02 (×2): 3 [IU] via SUBCUTANEOUS
  Administered 2014-06-02: 5 [IU] via SUBCUTANEOUS
  Administered 2014-06-02: 3 [IU] via SUBCUTANEOUS
  Administered 2014-06-03: 5 [IU] via SUBCUTANEOUS
  Administered 2014-06-03: 3 [IU] via SUBCUTANEOUS

## 2014-06-01 MED ORDER — SODIUM CHLORIDE 0.9 % IV BOLUS (SEPSIS)
250.0000 mL | Freq: Once | INTRAVENOUS | Status: AC
  Administered 2014-06-01: 250 mL via INTRAVENOUS

## 2014-06-01 MED ORDER — ONDANSETRON HCL 4 MG PO TABS: 4.0000 mg | ORAL_TABLET | Freq: Four times a day (QID) | ORAL | Status: DC | PRN

## 2014-06-01 MED ORDER — ACETAMINOPHEN 325 MG PO TABS: 650.0000 mg | ORAL_TABLET | Freq: Four times a day (QID) | ORAL | Status: DC | PRN

## 2014-06-01 MED ORDER — ALUM & MAG HYDROXIDE-SIMETH 200-200-20 MG/5ML PO SUSP: 30.0000 mL | Freq: Four times a day (QID) | ORAL | Status: DC | PRN

## 2014-06-01 MED ORDER — ALBUTEROL SULFATE (2.5 MG/3ML) 0.083% IN NEBU: 2.5000 mg | INHALATION_SOLUTION | Freq: Four times a day (QID) | RESPIRATORY_TRACT | Status: DC | PRN

## 2014-06-01 MED ORDER — LISINOPRIL 5 MG PO TABS: 2.5000 mg | ORAL_TABLET | Freq: Two times a day (BID) | ORAL | Status: DC

## 2014-06-01 MED ORDER — ENOXAPARIN SODIUM 40 MG/0.4ML ~~LOC~~ SOLN
40.0000 mg | SUBCUTANEOUS | Status: DC
  Administered 2014-06-01 – 2014-06-02 (×2): 40 mg via SUBCUTANEOUS
  Filled 2014-06-01 (×2): qty 0.4

## 2014-06-01 MED ORDER — BISMUTH SUBSALICYLATE 262 MG/15ML PO SUSP
30.0000 mL | Freq: Four times a day (QID) | ORAL | Status: DC | PRN
  Filled 2014-06-01: qty 236

## 2014-06-01 MED ORDER — ATORVASTATIN CALCIUM 40 MG PO TABS
40.0000 mg | ORAL_TABLET | Freq: Every day | ORAL | Status: DC
  Administered 2014-06-01 – 2014-06-02 (×2): 40 mg via ORAL
  Filled 2014-06-01 (×2): qty 1

## 2014-06-01 MED ORDER — TRAZODONE HCL 50 MG PO TABS: 25.0000 mg | ORAL_TABLET | Freq: Every evening | ORAL | Status: DC | PRN

## 2014-06-01 MED ORDER — HYDROCODONE-ACETAMINOPHEN 5-325 MG PO TABS: 1.0000 | ORAL_TABLET | ORAL | Status: DC | PRN

## 2014-06-01 MED ORDER — ASPIRIN EC 81 MG PO TBEC
81.0000 mg | DELAYED_RELEASE_TABLET | Freq: Every day | ORAL | Status: DC
Start: 2014-06-02 — End: 2014-06-03
  Administered 2014-06-02 – 2014-06-03 (×2): 81 mg via ORAL
  Filled 2014-06-01 (×2): qty 1

## 2014-06-01 MED ORDER — SODIUM CHLORIDE 0.9 % IJ SOLN: 3.0000 mL | INTRAMUSCULAR | Status: DC | PRN

## 2014-06-01 MED ORDER — SODIUM CHLORIDE 0.9 % IJ SOLN: 3.0000 mL | Freq: Two times a day (BID) | INTRAMUSCULAR | Status: DC

## 2014-06-01 MED ORDER — ONDANSETRON HCL 4 MG/2ML IJ SOLN: 4.0000 mg | Freq: Four times a day (QID) | INTRAMUSCULAR | Status: DC | PRN

## 2014-06-01 MED ORDER — NITROGLYCERIN 0.4 MG SL SUBL: 0.4000 mg | SUBLINGUAL_TABLET | SUBLINGUAL | Status: DC | PRN

## 2014-06-01 MED ORDER — CARVEDILOL 3.125 MG PO TABS
3.1250 mg | ORAL_TABLET | Freq: Two times a day (BID) | ORAL | Status: DC
  Administered 2014-06-01 – 2014-06-03 (×3): 3.125 mg via ORAL
  Filled 2014-06-01 (×3): qty 1

## 2014-06-01 MED ORDER — INSULIN ASPART 100 UNIT/ML ~~LOC~~ SOLN: 0.0000 [IU] | Freq: Every day | SUBCUTANEOUS | Status: DC

## 2014-06-01 MED ORDER — SODIUM CHLORIDE 0.9 % IV SOLN
INTRAVENOUS | Status: DC
Start: 2014-06-01 — End: 2014-06-02

## 2014-06-01 MED ORDER — SODIUM CHLORIDE 0.9 % IJ SOLN
3.0000 mL | Freq: Two times a day (BID) | INTRAMUSCULAR | Status: DC
  Administered 2014-06-02: 3 mL via INTRAVENOUS

## 2014-06-01 MED ORDER — ASPIRIN 81 MG PO CHEW
324.0000 mg | CHEWABLE_TABLET | Freq: Once | ORAL | Status: AC
  Administered 2014-06-01: 324 mg via ORAL
  Filled 2014-06-01: qty 4

## 2014-06-01 MED ORDER — BISACODYL 10 MG RE SUPP: 10.0000 mg | Freq: Every day | RECTAL | Status: DC | PRN

## 2014-06-01 MED ORDER — SODIUM CHLORIDE 0.9 % IV SOLN: 250.0000 mL | INTRAVENOUS | Status: DC | PRN

## 2014-06-01 MED ORDER — TAMSULOSIN HCL 0.4 MG PO CAPS
0.4000 mg | ORAL_CAPSULE | Freq: Every day | ORAL | Status: DC
Start: 2014-06-01 — End: 2014-06-03
  Administered 2014-06-01 – 2014-06-02 (×2): 0.4 mg via ORAL
  Filled 2014-06-01 (×2): qty 1

## 2014-06-01 MED ORDER — ACETAMINOPHEN 650 MG RE SUPP: 650.0000 mg | Freq: Four times a day (QID) | RECTAL | Status: DC | PRN

## 2014-06-01 MED ORDER — SODIUM CHLORIDE 0.9 % IV SOLN
INTRAVENOUS | Status: DC
  Administered 2014-06-01: 11:00:00 via INTRAVENOUS

## 2014-06-01 NOTE — ED Notes (Signed)
C/o recurrent chest pain. Reports this episode occurred this am 20 mins pta. Pt reports taking his " heart pill to stop pain" pt denies medication being nitro. Pt reports sob and dizzy as well.

## 2014-06-01 NOTE — ED Provider Notes (Signed)
CSN: [id number]     Arrival date & time 06/01/14  6237 History   First MD Initiated Contact with Patient 06/01/14 1012     Chief Complaint  Patient presents with  . Chest Pain     HPI Pt was seen at 1025. Per pt, c/o gradual onset and improvement in one episode of chest "pain" that occurred this morning PTA. Pt describes the CP as mid-sternal chest "tightness" and "pressure" that began while he was performing is morning ADL's at home. Has been associated with SOB, nausea, diaphoresis. Pt states he "took my regular medicines" and "then came to the ER." Pt states his symptoms lasted approximately 30 minutes before resolving. Pt states he "just feels tired now." Denies palpitations, no cough, no back pain, no abd pain, no vomiting/diarrhea, no fevers.    Past Medical History  Diagnosis Date  . Diabetes mellitus, type II   . Hyperlipidemia     Lipid profile in 02/2012:135, 227, 41, 49  . Coronary atherosclerosis of native coronary artery     a. CABG x 4 in 1989 (VG->OM1->OM2, VG->RCA, LIMA->LAD), b. 05/2010: DES to VG-OM1/OM2, DES to distal LCx. c. NSTEMI in 04/2011 - TO distal LCX stent and VG->OM2. d. 02/2012 NSTEMI DES to VG-OM1/continuation to OM2 occluded. e. inferior STEMI s/p DES to SVG-RAMUS 06/2012. f. inferolat STEMI 09/2012 s/p DES to SVG-interm; g. Lex MV (11/14):  EF 35%, inf-lat scar with small peri-infarct ischemia  . Essential hypertension, benign   . Osteoarthritis   . History of stroke   . History of pneumonia   . Cervical vertebral fracture   . Chronic back pain   . Benign prostatic hypertrophy     History of urinary retention  . Peptic ulcer disease   . Gastroesophageal reflux disease   . Ischemic cardiomyopathy Nov 2015    EF 35% cath, 45-50% by echo   Past Surgical History  Procedure Laterality Date  . Tonsillectomy    . Coronary artery bypass graft  1989    x 4  . Coronary angioplasty  10/12, 8/13, 12/13, 3/14    SVG-OM PCI  . Cardiac catheterization  05/19/14     SVG-OM occl- medical Rx   Family History  Problem Relation Age of Onset  . Early death      Parents died young  . Appendicitis Mother     Pt was 80 year old  . Heart attack Father 47   History  Substance Use Topics  . Smoking status: Former Smoker -- 2.00 packs/day for 10 years    Types: Cigarettes    Quit date: 07/14/1961  . Smokeless tobacco: Current User    Types: Chew  . Alcohol Use: No    Review of Systems ROS: Statement: All systems negative except as marked or noted in the HPI; Constitutional: Negative for fever and chills. +generalized fatigue; ; Eyes: Negative for eye pain, redness and discharge. ; ; ENMT: Negative for ear pain, hoarseness, nasal congestion, sinus pressure and sore throat. ; ; Cardiovascular: +CP, SOB, diaphoresis. Negative for palpitations, and peripheral edema. ; ; Respiratory: Negative for cough, wheezing and stridor. ; ; Gastrointestinal: +nausea. Negative for vomiting, diarrhea, abdominal pain, blood in stool, hematemesis, jaundice and rectal bleeding. . ; ; Genitourinary: Negative for dysuria, flank pain and hematuria. ; ; Musculoskeletal: Negative for back pain and neck pain. Negative for swelling and trauma.; ; Skin: Negative for pruritus, rash, abrasions, blisters, bruising and skin lesion.; ; Neuro: Negative for headache, lightheadedness and neck stiffness.  Negative for weakness, altered level of consciousness , altered mental status, extremity weakness, paresthesias, involuntary movement, seizure and syncope.      Allergies  Review of patient's allergies indicates no known allergies.  Home Medications   Prior to Admission medications   Medication Sig Start Date End Date Taking? Authorizing Provider  acetaminophen (TYLENOL) 325 MG tablet Take 2 tablets (650 mg total) by mouth every 4 (four) hours as needed for headache or mild pain. 05/21/14  Yes Luke K Kilroy, PA-C  albuterol (PROVENTIL HFA;VENTOLIN HFA) 108 (90 BASE) MCG/ACT inhaler Inhale 2  puffs into the lungs every 6 (six) hours as needed for wheezing or shortness of breath.   Yes Historical Provider, MD  aspirin EC 81 MG tablet Take 81 mg by mouth daily.   Yes Historical Provider, MD  atorvastatin (LIPITOR) 40 MG tablet Take 1 tablet (40 mg total) by mouth daily at 6 PM. 05/29/13  Yes Scott T Kathlen Mody, PA-C  bismuth subsalicylate (PEPTO BISMOL) 262 MG/15ML suspension Take 30 mLs by mouth every 6 (six) hours as needed for indigestion or diarrhea or loose stools.    Yes Historical Provider, MD  busPIRone (BUSPAR) 5 MG tablet Take 5 mg by mouth 2 (two) times daily.   Yes Historical Provider, MD  carvedilol (COREG) 3.125 MG tablet Take 3.125 mg by mouth 2 (two) times daily with a meal.   Yes Historical Provider, MD  furosemide (LASIX) 20 MG tablet Take 20 mg by mouth daily as needed for fluid. Take 1 tablet as needed if 3 lb weight gain. 05/06/13  Yes Lendon Colonel, NP  isosorbide mononitrate (IMDUR) 60 MG 24 hr tablet Take 1 tablet (60 mg total) by mouth daily. 05/21/14  Yes Luke K Kilroy, PA-C  lisinopril (PRINIVIL,ZESTRIL) 2.5 MG tablet Take 2.5 mg by mouth 2 (two) times daily. 05/04/14  Yes Herminio Commons, MD  metFORMIN (GLUCOPHAGE) 500 MG tablet Take 2 tablets (1,000 mg total) by mouth 2 (two) times daily with a meal. 05/22/14  Yes Erlene Quan, PA-C  Multiple Vitamin (MULTIVITAMIN WITH MINERALS) TABS Take 1 tablet by mouth daily.   Yes Historical Provider, MD  nitroGLYCERIN (NITROSTAT) 0.4 MG SL tablet Place 0.4 mg under the tongue every 5 (five) minutes as needed for chest pain.   Yes Historical Provider, MD  pantoprazole (PROTONIX) 40 MG tablet Take 40 mg by mouth daily.   Yes Historical Provider, MD  potassium chloride SA (K-DUR,KLOR-CON) 20 MEQ tablet Take 20 mEq by mouth daily as needed (Takes only with Lasix). 04/28/13  Yes Lendon Colonel, NP  ranolazine (RANEXA) 1000 MG SR tablet Take 1 tablet (1,000 mg total) by mouth 2 (two) times daily. 05/21/14  Yes Erlene Quan, PA-C  tamsulosin (FLOMAX) 0.4 MG CAPS Take 0.4 mg by mouth daily after supper.  01/27/13  Yes Historical Provider, MD  ticagrelor (BRILINTA) 90 MG TABS tablet Take 1 tablet (90 mg total) by mouth 2 (two) times daily. 12/16/13  Yes Lendon Colonel, NP   BP 99/54 mmHg  Pulse 72  Temp(Src) 97.6 F (36.4 C) (Oral)  Resp 16  SpO2 98% Physical Exam  1030: Physical examination:  Nursing notes reviewed; Vital signs and O2 SAT reviewed;  Constitutional: Well developed, Well nourished, Well hydrated, In no acute distress; Head:  Normocephalic, atraumatic; Eyes: EOMI, PERRL, No scleral icterus; ENMT: Mouth and pharynx normal, Mucous membranes moist; Neck: Supple, Full range of motion, No lymphadenopathy; Cardiovascular: Regular rate and rhythm, No gallop; Respiratory: Breath  sounds clear & equal bilaterally, No rales, rhonchi, wheezes.  Speaking full sentences with ease, Normal respiratory effort/excursion; Chest: Nontender, Movement normal; Abdomen: Soft, Nontender, Nondistended, Normal bowel sounds; Genitourinary: No CVA tenderness; Extremities: Pulses normal, No tenderness, No edema, No calf edema or asymmetry.; Neuro: AA&Ox3, vague historian. Major CN grossly intact.  Speech clear. No gross focal motor or sensory deficits in extremities.; Skin: Color normal, Warm, Dry.   ED Course  Procedures    EKG Interpretation   Date/Time:  Thursday June 01 2014 09:41:12 EST Ventricular Rate:  79 PR Interval:  174 QRS Duration: 115 QT Interval:  431 QTC Calculation: 494 R Axis:   86 Text Interpretation:  Sinus rhythm Nonspecific intraventricular conduction  delay Inferior infarct, age indeterminate Nonspecific ST and T wave  abnormality Inferolateral leads When compared with ECG of 05/19/2014  Nonspecific ST and T wave abnormality are more pronounced Confirmed by  [facility]  MD, Nunzio Cory [phone]) on 06/01/2014 10:00:03 AM      MDM  MDM Reviewed: previous chart, nursing note and  vitals Reviewed previous: labs and ECG Interpretation: labs, ECG and x-ray   Results for orders placed or performed during the hospital encounter of 06/01/14  Comprehensive metabolic panel  Result Value Ref Range   Sodium 136 (L) 137 - 147 mEq/L   Potassium 4.9 3.7 - 5.3 mEq/L   Chloride 98 96 - 112 mEq/L   CO2 25 19 - 32 mEq/L   Glucose, Bld 222 (H) 70 - 99 mg/dL   BUN 21 6 - 23 mg/dL   Creatinine, Ser 1.04 0.50 - 1.35 mg/dL   Calcium 9.7 8.4 - 10.5 mg/dL   Total Protein 7.1 6.0 - 8.3 g/dL   Albumin 3.9 3.5 - 5.2 g/dL   AST 21 0 - 37 U/L   ALT 24 0 - 53 U/L   Alkaline Phosphatase 86 39 - 117 U/L   Total Bilirubin 0.5 0.3 - 1.2 mg/dL   GFR calc non Af Amer 64 (L) >90 mL/min   GFR calc Af Amer 75 (L) >90 mL/min   Anion gap 13 5 - 15  CBC with Differential  Result Value Ref Range   WBC 8.1 4.0 - 10.5 K/uL   RBC 4.26 4.22 - 5.81 MIL/uL   Hemoglobin 13.9 13.0 - 17.0 g/dL   HCT 41.3 39.0 - 52.0 %   MCV 96.9 78.0 - 100.0 fL   MCH 32.6 26.0 - 34.0 pg   MCHC 33.7 30.0 - 36.0 g/dL   RDW 12.7 11.5 - 15.5 %   Platelets 194 150 - 400 K/uL   Neutrophils Relative % 72 43 - 77 %   Neutro Abs 5.9 1.7 - 7.7 K/uL   Lymphocytes Relative 16 12 - 46 %   Lymphs Abs 1.3 0.7 - 4.0 K/uL   Monocytes Relative 9 3 - 12 %   Monocytes Absolute 0.7 0.1 - 1.0 K/uL   Eosinophils Relative 3 0 - 5 %   Eosinophils Absolute 0.2 0.0 - 0.7 K/uL   Basophils Relative 0 0 - 1 %   Basophils Absolute 0.0 0.0 - 0.1 K/uL  Troponin I  Result Value Ref Range   Troponin I <0.30 <0.30 ng/mL   Dg Chest Portable 1 View 06/01/2014   CLINICAL DATA:  Chest pain.  EXAM: PORTABLE CHEST - 1 VIEW  COMPARISON:  May 18, 2014.  FINDINGS: The heart size and mediastinal contours are within normal limits. No pneumothorax or pleural effusion is noted. Status post coronary artery  bypass graft. Both lungs are clear. The visualized skeletal structures are unremarkable.  IMPRESSION: No acute cardiopulmonary abnormality seen.    Electronically Signed   By: Sabino Dick M.D.   On: 06/01/2014 10:06    1100:  Per EPIC chart review: Pt's SBP 80-90's for the past 2 weeks; baseline SBP was in 110-120's previous. Will dose judicious IVF. Pt with significant hx of ACS and concerning HPI today; will observation admit. Dx and testing d/w pt.  Questions answered.  Verb understanding, agreeable to admit.   T/C to [facility]. Roderic Palau, case discussed, including:  HPI, pertinent PM/SHx, VS/PE, dx testing, ED course and treatment:  Agreeable to admit, requests to write temporary orders, obtain observation tele bed to team APAdmits.      Francine Graven, DO 06/03/14 1927

## 2014-06-01 NOTE — ED Notes (Signed)
Report given to [facility] for room 321.

## 2014-06-01 NOTE — ED Notes (Signed)
Dietary called for tray to be delivered to ED.

## 2014-06-01 NOTE — Progress Notes (Signed)
Inpatient Diabetes Program Recommendations  AACE/ADA: New Consensus Statement on Inpatient Glycemic Control (2013)  Target Ranges:  Prepandial:   less than 140 mg/dL      Peak postprandial:   less than 180 mg/dL (1-2 hours)      Critically ill patients:  140 - 180 mg/dL   Results for STEFANOS, HAYNESWORTH (MRN [id number]) as of 06/01/2014 12:53  Ref. Range 06/01/2014 09:44  Glucose Latest Range: 70-99 mg/dL 222 (H)   Diabetes history: DM2 Outpatient Diabetes medications: Metformin 1000 mg BID Current orders for Inpatient glycemic control: None yet; only has temporary orders from ED MD  Inpatient Diabetes Program Recommendations Correction (SSI): Please order CBGs with Novolog correction scale ACHS.  Thanks, Barnie Alderman, RN, MSN, CCRN, CDE Diabetes Coordinator Inpatient Diabetes Program [phone] (Team Pager) [phone] (AP office) [phone] [facility] office)

## 2014-06-01 NOTE — H&P (Signed)
[facility] History and Physical  Jeffrey [name] QIH:[id number] DOB: 20-Feb-1929 DOA: 06/01/2014  Referring physician:  PCP: Jeffrey [name]., MD   Chief Complaint: dizziness  HPI: Jeffrey [name] is a 80 y.o. male has medical history of CABG times 04 1989, unstable angina cath on November 2015 revealed occlusions with collaterals and an EF 35%, orthostatic hypotension, diabetes, hypertension, since emergency department with chief complaint of dizziness and weakness. Initial evaluation reveals hypotension. Patient reports that recently medications were increased specifically Ranexa and his beta blocker. In addition nitrate added. For the last 4 mornings he has awakened "feeling great". Then attention then he reports taking "all 9 of my pills"and as the day wears on I and feel weak and dizzy with standing up. When awakened this morning I did not feel well well. I was dizzy slightly nauseous. He denies chest pain palpitations shortness of breath. Does report some left anterior chest discomfort but states "it's nothing like the pain had a much as before". He denies abdominal pain nausea vomiting fever chills or recent sick contacts. He denies dysuria hematuria frequency urgency. He does report some loose stool last night. Workup in the emergency room includes complete metabolic panel that is unremarkable except for serum glucose 222 complete blood count is unremarkable. Initial troponin is negative and chest x-ray is without any cardiopulmonary process. EKG with normal sinus rhythm and ST and T-wave abnormalities that when compared to previous readings are more pronounced. In the emergency department initial blood pressure 88/48 heart rate 65 respiratory 12 he is not hypoxic he is afebrile. The emergency department she is given 324 mg of aspirin.  Review of Systems:   10 point review of systems complete and all systems are negative except as indicated by the history of present illness  Past  Medical History  Diagnosis Date  . Diabetes mellitus, type II   . Hyperlipidemia     Lipid profile in 02/2012:135, 227, 41, 49  . Coronary atherosclerosis of native coronary artery     a. CABG x 4 in 1989 (VG->OM1->OM2, VG->RCA, LIMA->LAD), b. 05/2010: DES to VG-OM1/OM2, DES to distal LCx. c. NSTEMI in 04/2011 - TO distal LCX stent and VG->OM2. d. 02/2012 NSTEMI DES to VG-OM1/continuation to OM2 occluded. e. inferior STEMI s/p DES to SVG-RAMUS 06/2012. f. inferolat STEMI 09/2012 s/p DES to SVG-interm; g. Lex MV (11/14):  EF 35%, inf-lat scar with small peri-infarct ischemia  . Essential hypertension, benign   . Osteoarthritis   . History of stroke   . History of pneumonia   . Cervical vertebral fracture   . Chronic back pain   . Benign prostatic hypertrophy     History of urinary retention  . Peptic ulcer disease   . Gastroesophageal reflux disease   . Ischemic cardiomyopathy Nov 2015    EF 35% cath, 45-50% by echo   Past Surgical History  Procedure Laterality Date  . Tonsillectomy    . Coronary artery bypass graft  1989    x 4  . Coronary angioplasty  10/12, 8/13, 12/13, 3/14    SVG-OM PCI  . Cardiac catheterization  05/19/14    SVG-OM occl- medical Rx   Social History:  reports that he quit smoking about 52 years ago. His smoking use included Cigarettes. He has a 20 pack-year smoking history. His smokeless tobacco use includes Chew. He reports that he does not drink alcohol or use illicit drugs.  No Known Allergies  Family History  Problem Relation Age of Onset  .  Early death      Parents died young  . Appendicitis Mother     Pt was 85 year old  . Heart attack Father 69     Prior to Admission medications   Medication Sig Start Date End Date Taking? Authorizing [name]  acetaminophen (TYLENOL) 325 MG tablet Take 2 tablets (650 mg total) by mouth every 4 (four) hours as needed for headache or mild pain. 05/21/14  Yes Jeffrey [name]  albuterol (PROVENTIL HFA;VENTOLIN  HFA) 108 (90 BASE) MCG/ACT inhaler Inhale 2 puffs into the lungs every 6 (six) hours as needed for wheezing or shortness of breath.   Yes Jeffrey Provider, MD  aspirin EC 81 MG tablet Take 81 mg by mouth daily.   Yes Jeffrey Provider, MD  atorvastatin (LIPITOR) 40 MG tablet Take 1 tablet (40 mg total) by mouth daily at 6 PM. 05/29/13  Yes Jeffrey [name]  bismuth subsalicylate (PEPTO BISMOL) 262 MG/15ML suspension Take 30 mLs by mouth every 6 (six) hours as needed for indigestion or diarrhea or loose stools.    Yes Jeffrey Provider, MD  busPIRone (BUSPAR) 5 MG tablet Take 5 mg by mouth 2 (two) times daily.   Yes Jeffrey Provider, MD  carvedilol (COREG) 3.125 MG tablet Take 3.125 mg by mouth 2 (two) times daily with a meal.   Yes Jeffrey Provider, MD  furosemide (LASIX) 20 MG tablet Take 20 mg by mouth daily as needed for fluid. Take 1 tablet as needed if 3 lb weight gain. 05/06/13  Yes Jeffrey Colonel, Jeffrey [name]  isosorbide mononitrate (IMDUR) 60 MG 24 hr tablet Take 1 tablet (60 mg total) by mouth daily. 05/21/14  Yes Jeffrey [name]  lisinopril (PRINIVIL,ZESTRIL) 2.5 MG tablet Take 2.5 mg by mouth 2 (two) times daily. 05/04/14  Yes Jeffrey Commons, MD  metFORMIN (GLUCOPHAGE) 500 MG tablet Take 2 tablets (1,000 mg total) by mouth 2 (two) times daily with a meal. 05/22/14  Yes Jeffrey [name], Jeffrey [name]  Multiple Vitamin (MULTIVITAMIN WITH MINERALS) TABS Take 1 tablet by mouth daily.   Yes Jeffrey Provider, MD  nitroGLYCERIN (NITROSTAT) 0.4 MG SL tablet Place 0.4 mg under the tongue every 5 (five) minutes as needed for chest pain.   Yes Jeffrey Provider, MD  pantoprazole (PROTONIX) 40 MG tablet Take 40 mg by mouth daily.   Yes Jeffrey Provider, MD  potassium chloride SA (K-DUR,KLOR-CON) 20 MEQ tablet Take 20 mEq by mouth daily as needed (Takes only with Lasix). 04/28/13  Yes Jeffrey Colonel, Jeffrey [name]  ranolazine (RANEXA) 1000 MG SR tablet Take 1 tablet (1,000 mg total) by mouth 2  (two) times daily. 05/21/14  Yes Jeffrey [name], Jeffrey [name]  tamsulosin (FLOMAX) 0.4 MG CAPS Take 0.4 mg by mouth daily after supper.  01/27/13  Yes Jeffrey Provider, MD  ticagrelor (BRILINTA) 90 MG TABS tablet Take 1 tablet (90 mg total) by mouth 2 (two) times daily. 12/16/13  Yes Jeffrey Colonel, Jeffrey [name]   Physical Exam: Filed Vitals:   06/01/14 1102 06/01/14 1130 06/01/14 1200 06/01/14 1252  BP: 99/54 99/60 113/66 122/55  Pulse: 72 71 68 66  Temp:    98.1 F (36.7 C)  TempSrc:    Oral  Resp: 16 14 15 16   Height:    5\' 10"  (1.778 m)  Weight:    79.017 kg (174 lb 3.2 oz)  SpO2: 98% 98% 100% 100%    Wt Readings from Last 3 Encounters:  06/01/14 79.017 kg (174 lb  3.2 oz)  05/19/14 80.4 kg (177 lb 4 oz)  05/06/14 81.647 kg (180 lb)    General:  Appears calm and comfortable Eyes: PERRL, normal lids, irises & conjunctiva ENT: grossly normal hearing, mucous membranes of the mouth are moist and pink oropharynx without erythema or exudate Neck: no LAD, masses or thyromegaly Cardiovascular: RRR, no m/r/g. No LE edema. Respiratory: CTA bilaterally, no w/r/r. Normal respiratory effort. Abdomen: soft, ntnd positive bowel sounds Skin: no rash or induration seen on limited exam Musculoskeletal: grossly normal tone BUE/BLE Psychiatric: grossly normal mood and affect, speech fluent and appropriate Neurologic: grossly non-focal. speech clear facial symmetry           Labs on Admission:  Basic Metabolic Panel:  Recent Labs Lab 06/01/14 0944  NA 136*  K 4.9  CL 98  CO2 25  GLUCOSE 222*  BUN 21  CREATININE 1.04  CALCIUM 9.7   Liver Function Tests:  Recent Labs Lab 06/01/14 0944  AST 21  ALT 24  ALKPHOS 86  BILITOT 0.5  PROT 7.1  ALBUMIN 3.9   No results for input(s): LIPASE, AMYLASE in the last 168 hours. No results for input(s): AMMONIA in the last 168 hours. CBC:  Recent Labs Lab 06/01/14 0944  WBC 8.1  NEUTROABS 5.9  HGB 13.9  HCT 41.3  MCV 96.9  PLT 194   Cardiac  Enzymes:  Recent Labs Lab 06/01/14 0944  TROPONINI <0.30    BNP (last 3 results)  Recent Labs  08/17/13 2032  PROBNP 1587.0*   CBG: No results for input(s): GLUCAP in the last 168 hours.  Radiological Exams on Admission: Dg Chest Portable 1 View  06/01/2014   CLINICAL DATA:  Chest pain.  EXAM: PORTABLE CHEST - 1 VIEW  COMPARISON:  May 18, 2014.  FINDINGS: The heart size and mediastinal contours are within normal limits. No pneumothorax or pleural effusion is noted. Status post coronary artery bypass graft. Both lungs are clear. The visualized skeletal structures are unremarkable.  IMPRESSION: No acute cardiopulmonary abnormality seen.   Electronically Signed   By: Sabino Dick M.D.   On: 06/01/2014 10:06    EKG: Independently reviewed. NSR with ST ant T wave   Assessment/Plan Principal Problem:   Dizziness: Likely related to hypotension secondary to medications. He recently had a cath his results to an increase in his medical regimen specifically increase in Ranexa slight increase of his beta blocker and an addition of Imdur. He reports compliance with these medications in addition takes them all at the same time in the morning. Initially the emergency department he was hypotensive. Does not look particularly dehydrated. Will hold Ranexa and Imdur for now I will continue low-dose lisinopril and beta blocker with parameters. Will obtain orthostatic vital signs. Will request cardiology consult for in the morning for evaluation of medications. Active Problems:  Chest pain: Resolved at the time of my exam. He reports left anterior chest discomfort that was "not anything like the chest pain I had before". Initial EKG without ischemic changes. Initial troponin negative. Will monitor on telemetry we'll repeat EKG in the morning. Continue low-dose aspirin and beta blocker. See #1     BPH (benign prostatic hyperplasia): Stable at baseline continue Flomax    Hyperlipidemia: Continue  Lipitor    Diabetes mellitus, type II: Obtain A1c. Appetite is good. Continue oral medications. Use sliding scale insulin as well. He reports poor control i.e. CBG at home rarely less than 200    Cardiomyopathy, ischemic: Chart review  indicates recent cardiac cath reveals previously restenotic SVG to OM1/OM2, which was intervened upon is now completely occluded. He now has improved collaterals to a circumflex marginal vessel via the LIMA to LAD distal collaterals. His RCA is occluded as is the graft but moderate collaterals exist both from septal perforating arteries as well as the the LIMA to LAD  Code Status: full DVT Prophylaxis: Family Communication: none present Disposition Plan: home hopefully in am  Time spent: 32 minutes  [facility] Pager [phone]

## 2014-06-02 DIAGNOSIS — I5022 Chronic systolic (congestive) heart failure: Secondary | ICD-10-CM

## 2014-06-02 DIAGNOSIS — N189 Chronic kidney disease, unspecified: Secondary | ICD-10-CM

## 2014-06-02 DIAGNOSIS — I25708 Atherosclerosis of coronary artery bypass graft(s), unspecified, with other forms of angina pectoris: Secondary | ICD-10-CM

## 2014-06-02 DIAGNOSIS — R079 Chest pain, unspecified: Principal | ICD-10-CM

## 2014-06-02 DIAGNOSIS — R42 Dizziness and giddiness: Secondary | ICD-10-CM

## 2014-06-02 DIAGNOSIS — N400 Enlarged prostate without lower urinary tract symptoms: Secondary | ICD-10-CM

## 2014-06-02 DIAGNOSIS — I951 Orthostatic hypotension: Secondary | ICD-10-CM

## 2014-06-02 DIAGNOSIS — E860 Dehydration: Secondary | ICD-10-CM

## 2014-06-02 DIAGNOSIS — R0609 Other forms of dyspnea: Secondary | ICD-10-CM

## 2014-06-02 DIAGNOSIS — E1122 Type 2 diabetes mellitus with diabetic chronic kidney disease: Secondary | ICD-10-CM

## 2014-06-02 LAB — COMPREHENSIVE METABOLIC PANEL
ALBUMIN: 3.5 g/dL (ref 3.5–5.2)
ALT: 21 U/L (ref 0–53)
ANION GAP: 12 (ref 5–15)
AST: 20 U/L (ref 0–37)
Alkaline Phosphatase: 81 U/L (ref 39–117)
BILIRUBIN TOTAL: 0.4 mg/dL (ref 0.3–1.2)
BUN: 16 mg/dL (ref 6–23)
CHLORIDE: 102 meq/L (ref 96–112)
CO2: 23 mEq/L (ref 19–32)
CREATININE: 0.94 mg/dL (ref 0.50–1.35)
Calcium: 9 mg/dL (ref 8.4–10.5)
GFR calc Af Amer: 87 mL/min — ABNORMAL LOW (ref >90)
GFR calc non Af Amer: 75 mL/min — ABNORMAL LOW (ref >90)
Glucose, Bld: 183 mg/dL — ABNORMAL HIGH (ref 70–99)
Potassium: 4.4 mEq/L (ref 3.7–5.3)
Sodium: 137 mEq/L (ref 137–147)
Total Protein: 6.3 g/dL (ref 6.0–8.3)

## 2014-06-02 LAB — CBC
HCT: 38.9 % — ABNORMAL LOW (ref 39.0–52.0)
Hemoglobin: 13.4 g/dL (ref 13.0–17.0)
MCH: 32.7 pg (ref 26.0–34.0)
MCHC: 34.4 g/dL (ref 30.0–36.0)
MCV: 94.9 fL (ref 78.0–100.0)
Platelets: 153 10*3/uL (ref 150–400)
RBC: 4.1 MIL/uL — ABNORMAL LOW (ref 4.22–5.81)
RDW: 12.8 % (ref 11.5–15.5)
WBC: 5.9 10*3/uL (ref 4.0–10.5)

## 2014-06-02 LAB — HEMOGLOBIN A1C
HEMOGLOBIN A1C: 8.6 % — AB (ref <5.7)
MEAN PLASMA GLUCOSE: 200 mg/dL — AB (ref <117)

## 2014-06-02 LAB — GLUCOSE, CAPILLARY
GLUCOSE-CAPILLARY: 165 mg/dL — AB (ref 70–99)
GLUCOSE-CAPILLARY: 198 mg/dL — AB (ref 70–99)
GLUCOSE-CAPILLARY: 204 mg/dL — AB (ref 70–99)
Glucose-Capillary: 142 mg/dL — ABNORMAL HIGH (ref 70–99)

## 2014-06-02 MED ORDER — ALUM & MAG HYDROXIDE-SIMETH 200-200-20 MG/5ML PO SUSP: 30.0000 mL | Freq: Four times a day (QID) | ORAL | Status: DC | PRN

## 2014-06-02 NOTE — Care Management Note (Signed)
    Page 1 of 1   06/02/2014     11:55:32 AM CARE MANAGEMENT NOTE 06/02/2014  Patient:  Jeffrey [name], Jeffrey [name]   Account Number:  [id number]  Date Initiated:  06/02/2014  Documentation initiated by:  Theophilus Kinds  Subjective/Objective Assessment:   Pt admitted from home with CP and hypotension. Pt lives alone and will return home at discharge. Pt has a cane for prn use. Pt still drives and is fairly independent with ADl's.     Action/Plan:   no CM needs noted. Anticipate discharge 06/03/14.   Anticipated DC Date:  06/03/2014   Anticipated DC Plan:  [place]  CM consult      Choice offered to / List presented to:             Status of service:  Completed, signed off Medicare Important Message given?   (If response is "NO", the following Medicare IM given date fields will be blank) Date Medicare IM given:   Medicare IM given by:   Date Additional Medicare IM given:   Additional Medicare IM given by:    Discharge Disposition:  HOME/SELF CARE  Per UR Regulation:    If discussed at Long Length of Stay Meetings, dates discussed:    Comments:  06/02/14 [place], RN BSN CM

## 2014-06-02 NOTE — Consult Note (Signed)
CARDIOLOGY CONSULT NOTE  Patient ID: Jeffrey [name] MRN: [id number] DOB/AGE: 1929-04-14 80 y.o.  Admit date: 06/01/2014 Primary Physician [name]., MD  Reason for Consultation: hypotension, dizziness, chest pain, CAD  HPI: The patient is an 80 yr old male well known to me from clinic, whom I most recently evaluated in September. He has a history of CAD with CABG with multiple interventions who was recently hospitalized at [facility] and underwent coronary angiography. Coronary and bypass graft angiography demonstrated occlusion of a previously restenotic SVG to OM1/OM2, which had been intervened upon in the past. He was found to have improved collaterals to a circumflex marginal vessel via the LIMA to LAD distal collaterals. His RCA was occluded as was the graft but moderate collaterals exist both from septal perforating arteries as well as the LIMA to LAD. His most recent echocardiogram from 05/19/14 revealed mildly reduced LV systolic function, EF 17-79%.  At the time of discharge, medications were adjusted with an increase of Ranexa from 500 mg twice a day to 1000 mg twice a day, increase in oral nitrate therapy to Imdur 60 mg, and slight titration of his carvedilol to 6.25 mg twice a day. That being said, it appears the d/c med list notes Coreg 3.125 mg bid. Since discharge, he has gradually felt more weak and sluggish, short of breath, and dizzy, particularly after taking his meds. He lives 4 blocks from the hospital and usually drives himself but had to have a friend drive him yesterday. He had some mild chest discomfort which has since resolved.   Upon admission, BP 99/54. Ranexa and Imdur were held and IV fluids have been given. The patient has been gradually feeling better. I spoke with his nurse who held his Coreg and lisinopril as well. Orthostatics were checked: Lying-141/60, HR 72 Sitting-107/64, HR 78 Standing-137/?, 110/58  Troponins have been normal. ECG  showed sinus rhythm with diffuse nonspecific ST-T abnormality, unchanged from one completed two weeks prior.    No Known Allergies  Current Facility-Administered Medications  Medication Dose Route Frequency Provider Last Rate Last Dose  . 0.9 %  sodium chloride infusion   Intravenous Continuous Radene Gunning, NP 100 mL/hr at 06/02/14 1446 100 mL/hr at 06/02/14 1446  . 0.9 %  sodium chloride infusion  250 mL Intravenous PRN Radene Gunning, NP      . acetaminophen (TYLENOL) tablet 650 mg  650 mg Oral Q6H PRN Radene Gunning, NP       Or  . acetaminophen (TYLENOL) suppository 650 mg  650 mg Rectal Q6H PRN Lezlie Octave Black, NP      . albuterol (PROVENTIL) (2.5 MG/3ML) 0.083% nebulizer solution 2.5 mg  2.5 mg Nebulization Q6H PRN Kathie Dike, MD      . alum & mag hydroxide-simeth (MAALOX/MYLANTA) 200-200-20 MG/5ML suspension 30 mL  30 mL Oral Q6H PRN Kathie Dike, MD      . aspirin EC tablet 81 mg  81 mg Oral Daily Lezlie Octave Black, NP   81 mg at 06/02/14 1044  . atorvastatin (LIPITOR) tablet 40 mg  40 mg Oral q1800 Radene Gunning, NP   40 mg at 06/01/14 1710  . bisacodyl (DULCOLAX) suppository 10 mg  10 mg Rectal Daily PRN Radene Gunning, NP      . bismuth subsalicylate (PEPTO BISMOL) 262 MG/15ML suspension 30 mL  30 mL Oral Q6H PRN Radene Gunning, NP      . carvedilol (COREG)  tablet 3.125 mg  3.125 mg Oral BID WC Radene Gunning, NP   Stopped at 06/02/14 0800  . enoxaparin (LOVENOX) injection 40 mg  40 mg Subcutaneous Q24H Radene Gunning, NP   40 mg at 06/01/14 2145  . HYDROcodone-acetaminophen (NORCO/VICODIN) 5-325 MG per tablet 1-2 tablet  1-2 tablet Oral Q4H PRN Radene Gunning, NP      . insulin aspart (novoLOG) injection 0-15 Units  0-15 Units Subcutaneous TID WC Radene Gunning, NP   3 Units at 06/02/14 1229  . insulin aspart (novoLOG) injection 0-5 Units  0-5 Units Subcutaneous QHS Radene Gunning, NP   0 Units at 06/01/14 2200  . lisinopril (PRINIVIL,ZESTRIL) tablet 2.5 mg  2.5 mg Oral BID Radene Gunning, NP   Stopped at 06/02/14 1000  . nitroGLYCERIN (NITROSTAT) SL tablet 0.4 mg  0.4 mg Sublingual Q5 min PRN Radene Gunning, NP      . ondansetron [facility]) tablet 4 mg  4 mg Oral Q6H PRN Radene Gunning, NP       Or  . ondansetron [facility]) injection 4 mg  4 mg Intravenous Q6H PRN Radene Gunning, NP      . pantoprazole (PROTONIX) EC tablet 40 mg  40 mg Oral Daily Lezlie Octave Black, NP   40 mg at 06/02/14 1044  . sodium chloride 0.9 % injection 3 mL  3 mL Intravenous Q12H Lezlie Octave Black, NP   3 mL at 06/01/14 1430  . sodium chloride 0.9 % injection 3 mL  3 mL Intravenous Q12H Lezlie Octave Black, NP   3 mL at 06/02/14 1045  . sodium chloride 0.9 % injection 3 mL  3 mL Intravenous PRN Radene Gunning, NP      . tamsulosin (FLOMAX) capsule 0.4 mg  0.4 mg Oral QPC supper Radene Gunning, NP   0.4 mg at 06/01/14 1710  . traZODone (DESYREL) tablet 25 mg  25 mg Oral QHS PRN Radene Gunning, NP        Past Medical History  Diagnosis Date  . Diabetes mellitus, type II   . Hyperlipidemia     Lipid profile in 02/2012:135, 227, 41, 49  . Coronary atherosclerosis of native coronary artery     a. CABG x 4 in 1989 (VG->OM1->OM2, VG->RCA, LIMA->LAD), b. 05/2010: DES to VG-OM1/OM2, DES to distal LCx. c. NSTEMI in 04/2011 - TO distal LCX stent and VG->OM2. d. 02/2012 NSTEMI DES to VG-OM1/continuation to OM2 occluded. e. inferior STEMI s/p DES to SVG-RAMUS 06/2012. f. inferolat STEMI 09/2012 s/p DES to SVG-interm; g. Lex MV (11/14):  EF 35%, inf-lat scar with small peri-infarct ischemia  . Essential hypertension, benign   . Osteoarthritis   . History of stroke   . History of pneumonia   . Cervical vertebral fracture   . Chronic back pain   . Benign prostatic hypertrophy     History of urinary retention  . Peptic ulcer disease   . Gastroesophageal reflux disease   . Ischemic cardiomyopathy Nov 2015    EF 35% cath, 45-50% by echo    Past Surgical History  Procedure Laterality Date  . Tonsillectomy    . Coronary artery  bypass graft  1989    x 4  . Coronary angioplasty  10/12, 8/13, 12/13, 3/14    SVG-OM PCI  . Cardiac catheterization  05/19/14    SVG-OM occl- medical Rx    History   Social History  . Marital Status: Divorced  Spouse Name: N/A    Number of Children: N/A  . Years of Education: N/A   Occupational History  . Retired     Designer, television/film set   Social History Main Topics  . Smoking status: Former Smoker -- 2.00 packs/day for 10 years    Types: Cigarettes    Quit date: 07/14/1961  . Smokeless tobacco: Current User    Types: Chew  . Alcohol Use: No  . Drug Use: No  . Sexual Activity: No   Other Topics Concern  . Not on file   Social History Narrative   ** Merged History Encounter **...   He was raised by his uncle.  His mother deceased when   he was 46 year old with appendicitis. Father deceased from an MI at age   52.  He has 1 sister, but he does not know her health status as they   have been separated.            No family history of premature CAD in 1st degree relatives.  Prior to Admission medications   Medication Sig Start Date End Date Taking? Authorizing Provider  acetaminophen (TYLENOL) 325 MG tablet Take 2 tablets (650 mg total) by mouth every 4 (four) hours as needed for headache or mild pain. 05/21/14  Yes Luke K Kilroy, PA-C  albuterol (PROVENTIL HFA;VENTOLIN HFA) 108 (90 BASE) MCG/ACT inhaler Inhale 2 puffs into the lungs every 6 (six) hours as needed for wheezing or shortness of breath.   Yes Historical Provider, MD  aspirin EC 81 MG tablet Take 81 mg by mouth daily.   Yes Historical Provider, MD  atorvastatin (LIPITOR) 40 MG tablet Take 1 tablet (40 mg total) by mouth daily at 6 PM. 05/29/13  Yes Scott T Kathlen Mody, PA-C  bismuth subsalicylate (PEPTO BISMOL) 262 MG/15ML suspension Take 30 mLs by mouth every 6 (six) hours as needed for indigestion or diarrhea or loose stools.    Yes Historical Provider, MD  busPIRone (BUSPAR) 5 MG tablet Take 5 mg by mouth 2 (two) times  daily.   Yes Historical Provider, MD  carvedilol (COREG) 3.125 MG tablet Take 3.125 mg by mouth 2 (two) times daily with a meal.   Yes Historical Provider, MD  furosemide (LASIX) 20 MG tablet Take 20 mg by mouth daily as needed for fluid. Take 1 tablet as needed if 3 lb weight gain. 05/06/13  Yes Lendon Colonel, NP  isosorbide mononitrate (IMDUR) 60 MG 24 hr tablet Take 1 tablet (60 mg total) by mouth daily. 05/21/14  Yes Luke K Kilroy, PA-C  lisinopril (PRINIVIL,ZESTRIL) 2.5 MG tablet Take 2.5 mg by mouth 2 (two) times daily. 05/04/14  Yes Herminio Commons, MD  metFORMIN (GLUCOPHAGE) 500 MG tablet Take 2 tablets (1,000 mg total) by mouth 2 (two) times daily with a meal. 05/22/14  Yes Erlene Quan, PA-C  Multiple Vitamin (MULTIVITAMIN WITH MINERALS) TABS Take 1 tablet by mouth daily.   Yes Historical Provider, MD  nitroGLYCERIN (NITROSTAT) 0.4 MG SL tablet Place 0.4 mg under the tongue every 5 (five) minutes as needed for chest pain.   Yes Historical Provider, MD  pantoprazole (PROTONIX) 40 MG tablet Take 40 mg by mouth daily.   Yes Historical Provider, MD  potassium chloride SA (K-DUR,KLOR-CON) 20 MEQ tablet Take 20 mEq by mouth daily as needed (Takes only with Lasix). 04/28/13  Yes Lendon Colonel, NP  ranolazine (RANEXA) 1000 MG SR tablet Take 1 tablet (1,000 mg total) by mouth 2 (two) times  daily. 05/21/14  Yes Erlene Quan, PA-C  tamsulosin (FLOMAX) 0.4 MG CAPS Take 0.4 mg by mouth daily after supper.  01/27/13  Yes Historical Provider, MD  ticagrelor (BRILINTA) 90 MG TABS tablet Take 1 tablet (90 mg total) by mouth 2 (two) times daily. 12/16/13  Yes Lendon Colonel, NP     Review of systems complete and found to be negative unless listed above in HPI     Physical exam Blood pressure 141/60, pulse 72, temperature 98.2 F (36.8 C), temperature source Oral, resp. rate 18, height 5\' 10"  (1.778 m), weight 173 lb 8 oz (78.7 kg), SpO2 98 %. General: NAD Neck: No JVD, no thyromegaly  or thyroid nodule.  Lungs: Clear to auscultation bilaterally with normal respiratory effort. CV: Nondisplaced PMI. Regular rate and rhythm, normal S1/S2, no S3/S4, no murmur.  No peripheral edema.  No carotid bruit.  Normal pedal pulses.  Abdomen: Soft, nontender, no hepatosplenomegaly, no distention.  Skin: Intact without lesions or rashes.  Neurologic: Alert and oriented x 3.  Psych: Normal affect. Extremities: No clubbing or cyanosis.  HEENT: Normal.   ECG: Most recent ECG reviewed.  Labs:   Lab Results  Component Value Date   WBC 5.9 06/02/2014   HGB 13.4 06/02/2014   HCT 38.9* 06/02/2014   MCV 94.9 06/02/2014   PLT 153 06/02/2014    Recent Labs Lab 06/02/14 0614  NA 137  K 4.4  CL 102  CO2 23  BUN 16  CREATININE 0.94  CALCIUM 9.0  PROT 6.3  BILITOT 0.4  ALKPHOS 81  ALT 21  AST 20  GLUCOSE 183*   Lab Results  Component Value Date   CKTOTAL 256* 01/11/2014   CKMB 15.9* 08/28/2013   TROPONINI <0.30 06/01/2014    Lab Results  Component Value Date   CHOL 190 05/29/2013   CHOL 169 05/22/2013   CHOL 124 10/07/2012   Lab Results  Component Value Date   HDL 33* 05/29/2013   HDL 33* 05/22/2013   HDL 31* 10/07/2012   Lab Results  Component Value Date   LDLCALC 90 05/29/2013   LDLCALC 90 05/22/2013   LDLCALC 51 10/07/2012   Lab Results  Component Value Date   TRIG 335* 05/29/2013   TRIG 230* 05/22/2013   TRIG 211* 10/07/2012   Lab Results  Component Value Date   CHOLHDL 5.8 05/29/2013   CHOLHDL 5.1 05/22/2013   CHOLHDL 4.0 10/07/2012   No results found for: LDLDIRECT       Studies: Dg Chest Portable 1 View  06/01/2014   CLINICAL DATA:  Chest pain.  EXAM: PORTABLE CHEST - 1 VIEW  COMPARISON:  May 18, 2014.  FINDINGS: The heart size and mediastinal contours are within normal limits. No pneumothorax or pleural effusion is noted. Status post coronary artery bypass graft. Both lungs are clear. The visualized skeletal structures are  unremarkable.  IMPRESSION: No acute cardiopulmonary abnormality seen.   Electronically Signed   By: Sabino Dick M.D.   On: 06/01/2014 10:06    ASSESSMENT AND PLAN:  1. Hypotension/dizziness/SOB: All related to medication adjustments made at time of discharge. When I had been seeing him in the office, he had BP's 180/80 mmHg at times. I agree with holding lisinopril altogether even at discharge as it is likely providing minimal benefit if any. I also agree with IV fluids. Initial BUN/SCr 21/1.04, had been 11/0.85, and is most recently 16/0.94 after IV fluids, indicating some degree of prerenal etiology. For the time  being, hold Ranexa as well as Imdur. Can provide Coreg 3.125 mg bid at time of discharge after he has been fluid repleted if BP can tolerate. I will have him evaluated within the next 7 days in our office for further adjustments.  2. CAD/CABG: ECG unchanged and troponins normal, with cath results as noted above. Continue ASA, Brilinta, and statin with medication changes as per #1.  3. Chronic systolic heart failure: EF 45-50%. Would have him take Lasix 20 mg prn, and not scheduled daily.  4. Type 2 diabetes: Poor control with HbA1C 8.6%. Continue metformin 1000 mg bid but will likely require advancement of therapy, as per PCP.   Signed: [name], M.D., F.A.C.C.  06/02/2014, 3:22 PM

## 2014-06-02 NOTE — Progress Notes (Signed)
Discussed with Dyanne Carrel, NP the patients [facility].  The patient is symptomatic as evidence of c/o dizziness and was unsteady when he stood.  Recommend hold Lisinopril.  New orders to hold Coreg and Lisinopril.  Will continue to monitor.

## 2014-06-02 NOTE — Progress Notes (Signed)
UR completed 

## 2014-06-02 NOTE — Evaluation (Signed)
Physical Therapy Evaluation Patient Details Name: Jeffrey [name] MRN: [id number] DOB: [birth year] Today's Date: 06/02/2014   History of Present Illness  Pt is an 80 year old male admitte with symptoms of dizziness, dyspnea and chest pain.  He recently had a cardiac catheterization.  He has a hx of OA, CVA and DM.  Currently he is experiencing orthostatic hypotension possibly related to medications.  He is here for observation.  Clinical Impression   Pt was seen for evaluation and found to be at functional baseline.  He experienced no dizziness during transfers or gait.  I have recommended that he use his cane outside of the home.    Follow Up Recommendations No PT follow up    Equipment Recommendations  None recommended by PT    Recommendations for Other Services   none    Precautions / Restrictions Precautions Precautions: Fall Restrictions Weight Bearing Restrictions: No      Mobility  Bed Mobility Overal bed mobility: Independent                Transfers Overall transfer level: Modified independent Equipment used: None             General transfer comment: pt had mild difficulty getting feet placed for good stability upon standing but was able to achieve a stable stance independently  Ambulation/Gait Ambulation/Gait assistance: Supervision Ambulation Distance (Feet): 100 Feet Assistive device: None Gait Pattern/deviations: Trunk flexed   Gait velocity interpretation: Below normal speed for age/gender General Gait Details: minimal trunk or head rotation during gait  Stairs            Wheelchair Mobility    Modified Rankin (Stroke Patients Only)       Balance Overall balance assessment: Needs assistance   Sitting balance-Leahy Scale: Normal       Standing balance-Leahy Scale: Good                               Pertinent Vitals/Pain Pain Assessment: No/denies pain    Home Living Family/patient expects to be  discharged to:: Private residence Living Arrangements: Children Available Help at Discharge: Family;Available PRN/intermittently Type of Home: Apartment Home Access: Stairs to enter Entrance Stairs-Rails: Right Entrance Stairs-Number of Steps: 3 Home Layout: One level Home Equipment: Walker - 2 wheels;Cane - single point;Grab bars - tub/shower;Grab bars - toilet      Prior Function Level of Independence: Independent         Comments: pt generally "furiniture walks" at home and I have recommended that he use his cane outside of the home     Hand Dominance   Dominant Hand: Right    Extremity/Trunk Assessment               Lower Extremity Assessment: Overall WFL for tasks assessed      Cervical / Trunk Assessment: Kyphotic  Communication   Communication: No difficulties  Cognition Arousal/Alertness: Awake/alert Behavior During Therapy: WFL for tasks assessed/performed Overall Cognitive Status: Within Functional Limits for tasks assessed                      General Comments      Exercises        Assessment/Plan    PT Assessment Patent does not need any further PT services  PT Diagnosis     PT Problem List    PT Treatment Interventions     PT Goals (Current goals  can be found in the Care Plan section) Acute Rehab PT Goals PT Goal Formulation: All assessment and education complete, DC therapy    Frequency     Barriers to discharge  none      Co-evaluation               End of Session Equipment Utilized During Treatment: Gait belt Activity Tolerance: Patient tolerated treatment well Patient left: in bed;with call bell/phone within reach;with bed alarm set Nurse Communication: Mobility status    Functional Assessment Tool Used: clinical judgement Functional Limitation: Mobility: Walking and moving around Mobility: Walking and Moving Around Current Status ([id number]): 0 percent impaired, limited or restricted Mobility: Walking and  Moving Around Goal Status [phone]): 0 percent impaired, limited or restricted Mobility: Walking and Moving Around Discharge Status [phone]): 0 percent impaired, limited or restricted    Time: 7494-4967 PT Time Calculation (min) (ACUTE ONLY): 31 min   Charges:   PT Evaluation $Initial PT Evaluation Tier I: 1 Procedure     PT G Codes:   Functional Assessment Tool Used: clinical judgement Functional Limitation: Mobility: Walking and moving around    [place] 06/02/2014, 11:20 AM

## 2014-06-02 NOTE — Discharge Summary (Signed)
Physician Discharge Summary  Jeffrey [name] PPJ:[id number] DOB: 1928/11/14 DOA: 06/01/2014  PCP: [name]., MD  Admit date: 06/01/2014 Discharge date: 06/02/2014  Time spent: 40 minutes  Recommendations for Outpatient Follow-up:  1. Follow up with cardiology in 1 week for reevaluation of medication regimen 2. Ranexa, Imdur, lisinopril has been held. He is continued on Coreg   Discharge Diagnoses:  Principal Problem:   Dizziness Active Problems:   BPH (benign prostatic hyperplasia)   Hyperlipidemia   Diabetes mellitus, type II   Cardiomyopathy, ischemic   Coronary atherosclerosis of native coronary artery   Chest pain  orthostasis Dehydration  Discharge Condition: stable  Diet recommendation: heart healthy carb modified  Filed Weights   06/01/14 1252 06/02/14 0425  Weight: 79.017 kg (174 lb 3.2 oz) 78.7 kg (173 lb 8 oz)    History of present illness:  Jeffrey [name] is a 80 y.o. man has medical history of CABG x4 1989, unstable angina cath on November 2015 revealed occlusions with collaterals and an EF 35%, orthostatic hypotension, diabetes, hypertension, presented to emergency department on 11/19 with chief complaint of dizziness and weakness. Initial evaluation revealed hypotension. Patient reported that recently medications were increased specifically Ranexa and his beta blocker. In addition nitrate added. For the previous 4 mornings he awakened "feeling great". Reportrf taking "all 9 of my pills"and as the day wears on I  feel weak and dizzy with standing up. When awakened that morning he did not feel well. he was dizzy slightly nauseous. He denied chest pain palpitations shortness of breath. Reported some left anterior chest discomfort but stated "it's nothing like the pain I had  before". He denied abdominal pain nausea vomiting fever chills or recent sick contacts. He denied dysuria hematuria frequency urgency. He reported some loose stool the night  prior.  Workup in the emergency room included complete metabolic panel that was unremarkable except for serum glucose 222. complete blood count  unremarkable. Initial troponin negative and chest x-ray  without any cardiopulmonary process. EKG with normal sinus rhythm and ST and T-wave abnormalities that when compared to previous readings are more pronounced.  In the emergency department initial blood pressure 88/48 heart rate 65 respiratory 12 he was not hypoxic he was afebrile. The emergency department she wais given 324 mg of aspirin.  Hospital Course:  Dizziness: Likely related to hypotension secondary to medications. He recently had cath and results led to an increase in his medical regimen specifically increase in Ranexa slight increase of his beta blocker and an addition of Imdur.  Initially the emergency department he was hypotensive. He was admitted and Ranexa, lisinopril and Imdur held. Beta blocker was continued. BP dropped with position change but stablize in standing position. It was felt he was mildly dehydrated. Cardiology evaluated who agreed to hold current cardiac medications and to continue with beta blocker. The also agreed with hydration. He'll be followed up with the cardiology service in 7 days to reassess his medication regimen. Active Problems: Chest pain: Resolved at admission.  He reported left anterior chest discomfort that was "not anything like the chest pain I had before". Initial EKG without ischemic changes. Initial troponin negative. See #1    BPH (benign prostatic hyperplasia): Stable at baseline continue Flomax   Hyperlipidemia: Continue Lipitor   Diabetes mellitus, type II:  A1c 8.. Appetite is good. He reports poor control i.e. CBG at home rarely less than 200   Cardiomyopathy, ischemic: Chart review indicated recent cardiac cath reveals previously restenotic SVG  to OM1/OM2, which was intervened upon is now completely occluded. He now has improved  collaterals to a circumflex marginal vessel via the LIMA to LAD distal collaterals. His RCA is occluded as is the graft but moderate collaterals exist both from septal perforating arteries as well as the the LIMA to LAD   Procedures:    Consultations:  cardiology  Discharge Exam: Filed Vitals:   06/02/14 1410  BP: 141/60  Pulse: 72  Temp: 98.2 F (36.8 C)  Resp: 18    General: well nourished appears well Cardiovascular: RRR No MGR No LE edema Respiratory: normal effort  Bs clear bilaterally no wheeze  Discharge Instructions You were cared for by a hospitalist during your hospital stay. If you have any questions about your discharge medications or the care you received while you were in the hospital after you are discharged, you can call the unit and asked to speak with the hospitalist on call if the hospitalist that took care of you is not available. Once you are discharged, your primary care physician will handle any further medical issues. Please note that NO REFILLS for any discharge medications will be authorized once you are discharged, as it is imperative that you return to your primary care physician (or establish a relationship with a primary care physician if you do not have one) for your aftercare needs so that they can reassess your need for medications and monitor your lab values.   Current Discharge Medication List    CONTINUE these medications which have NOT CHANGED   Details  acetaminophen (TYLENOL) 325 MG tablet Take 2 tablets (650 mg total) by mouth every 4 (four) hours as needed for headache or mild pain.    albuterol (PROVENTIL HFA;VENTOLIN HFA) 108 (90 BASE) MCG/ACT inhaler Inhale 2 puffs into the lungs every 6 (six) hours as needed for wheezing or shortness of breath.    aspirin EC 81 MG tablet Take 81 mg by mouth daily.    atorvastatin (LIPITOR) 40 MG tablet Take 1 tablet (40 mg total) by mouth daily at 6 PM. Qty: 30 tablet, Refills: 11    bismuth  subsalicylate (PEPTO BISMOL) 262 MG/15ML suspension Take 30 mLs by mouth every 6 (six) hours as needed for indigestion or diarrhea or loose stools.     busPIRone (BUSPAR) 5 MG tablet Take 5 mg by mouth 2 (two) times daily.    carvedilol (COREG) 3.125 MG tablet Take 3.125 mg by mouth 2 (two) times daily with a meal.    furosemide (LASIX) 20 MG tablet Take 20 mg by mouth daily as needed for fluid. Take 1 tablet as needed if 3 lb weight gain.    metFORMIN (GLUCOPHAGE) 500 MG tablet Take 2 tablets (1,000 mg total) by mouth 2 (two) times daily with a meal.    Multiple Vitamin (MULTIVITAMIN WITH MINERALS) TABS Take 1 tablet by mouth daily.    nitroGLYCERIN (NITROSTAT) 0.4 MG SL tablet Place 0.4 mg under the tongue every 5 (five) minutes as needed for chest pain.    pantoprazole (PROTONIX) 40 MG tablet Take 40 mg by mouth daily.    tamsulosin (FLOMAX) 0.4 MG CAPS Take 0.4 mg by mouth daily after supper.     ticagrelor (BRILINTA) 90 MG TABS tablet Take 1 tablet (90 mg total) by mouth 2 (two) times daily. Qty: 60 tablet, Refills: 6      STOP taking these medications     isosorbide mononitrate (IMDUR) 60 MG 24 hr tablet  lisinopril (PRINIVIL,ZESTRIL) 2.5 MG tablet      potassium chloride SA (K-DUR,KLOR-CON) 20 MEQ tablet      ranolazine (RANEXA) 1000 MG SR tablet        No Known Allergies    The results of significant diagnostics from this hospitalization (including imaging, microbiology, ancillary and laboratory) are listed below for reference.    Significant Diagnostic Studies: Dg Chest Portable 1 View  06/01/2014   CLINICAL DATA:  Chest pain.  EXAM: PORTABLE CHEST - 1 VIEW  COMPARISON:  May 18, 2014.  FINDINGS: The heart size and mediastinal contours are within normal limits. No pneumothorax or pleural effusion is noted. Status post coronary artery bypass graft. Both lungs are clear. The visualized skeletal structures are unremarkable.  IMPRESSION: No acute cardiopulmonary  abnormality seen.   Electronically Signed   By: Sabino Dick M.D.   On: 06/01/2014 10:06   Dg Chest Portable 1 View  05/18/2014   CLINICAL DATA:  Chest pain and diaphoresis.  Weakness  EXAM: PORTABLE CHEST - 1 VIEW  COMPARISON:  05/06/2014  FINDINGS: The patient is status post median sternotomy and CABG procedure. Normal heart size. No pleural effusion or edema. No airspace consolidation.  IMPRESSION: 1. No active cardiopulmonary abnormalities.   Electronically Signed   By: Kerby Moors M.D.   On: 05/18/2014 18:33   Dg Abd Acute W/chest  05/06/2014   CLINICAL DATA:  Right lower quadrant pain.  EXAM: ACUTE ABDOMEN SERIES (ABDOMEN 2 VIEW & CHEST 1 VIEW)  COMPARISON:  Chest radiograph 03/27/2014 and abdominal radiograph 11/28/2012  FINDINGS: There are changes of median sternotomy for CABG. The superior most median sternotomy wire is fractured. Coronary artery stent material is also noted. Heart size is stable and appears within normal limits. The pulmonary vascularity is normal and in the lungs are clear. There is a moderate amount of stool throughout the colon. The bowel gas pattern is nonobstructive. There is a convex-left scoliotic curvature of the lumbar spine that appears stable. Negative for free intraperitoneal air.  IMPRESSION: 1. Nonobstructive bowel gas pattern. Moderate amount of stool in the colon. 2. No acute cardiopulmonary disease. Prior median sternotomy for CABG.   Electronically Signed   By: Curlene Dolphin M.D.   On: 05/06/2014 17:03    Microbiology: No results found for this or any previous visit (from the past 240 hour(s)).   Labs: Basic Metabolic Panel:  Recent Labs Lab 06/01/14 0944 06/02/14 0614  NA 136* 137  K 4.9 4.4  CL 98 102  CO2 25 23  GLUCOSE 222* 183*  BUN 21 16  CREATININE 1.04 0.94  CALCIUM 9.7 9.0   Liver Function Tests:  Recent Labs Lab 06/01/14 0944 06/02/14 0614  AST 21 20  ALT 24 21  ALKPHOS 86 81  BILITOT 0.5 0.4  PROT 7.1 6.3  ALBUMIN 3.9  3.5   No results for input(s): LIPASE, AMYLASE in the last 168 hours. No results for input(s): AMMONIA in the last 168 hours. CBC:  Recent Labs Lab 06/01/14 0944 06/02/14 0614  WBC 8.1 5.9  NEUTROABS 5.9  --   HGB 13.9 13.4  HCT 41.3 38.9*  MCV 96.9 94.9  PLT 194 153   Cardiac Enzymes:  Recent Labs Lab 06/01/14 0944 06/01/14 1525 06/01/14 2140  TROPONINI <0.30 <0.30 <0.30   BNP: BNP (last 3 results)  Recent Labs  08/17/13 2032  PROBNP 1587.0*   CBG:  Recent Labs Lab 06/01/14 1644 06/01/14 2024 06/02/14 0750 06/02/14 1129 06/02/14 1629  GLUCAP 158* 120* 165* 198* 204*       Signed:  Dyanne Carrel, NP  [facility] 06/02/2014, 7:23 PM    Attending note:  Patient seen and examined. Note reviewed as per Jeffrey Carrel, NP.  The patient was admitted with dizziness/lightheadedness and generalized weakness, particularly worse after taking his medications. His cardiac medication regimen was recently adjusted when he was in the hospital. He was noted to be hypotensive on admission which was likely a result of too much medication. He was seen by cardiology and medications were adjusted. He is also mildly dehydrated and is currently receiving IV fluids. If he feels improved tomorrow, he can likely discharge home and follow-up with cardiology to reassess his medication regimen.  Jeffrey [name]

## 2014-06-03 LAB — BASIC METABOLIC PANEL
ANION GAP: 13 (ref 5–15)
BUN: 11 mg/dL (ref 6–23)
CO2: 24 mEq/L (ref 19–32)
Calcium: 9.2 mg/dL (ref 8.4–10.5)
Chloride: 101 mEq/L (ref 96–112)
Creatinine, Ser: 0.82 mg/dL (ref 0.50–1.35)
GFR, EST NON AFRICAN AMERICAN: 80 mL/min — AB (ref >90)
Glucose, Bld: 200 mg/dL — ABNORMAL HIGH (ref 70–99)
POTASSIUM: 4.2 meq/L (ref 3.7–5.3)
Sodium: 138 mEq/L (ref 137–147)

## 2014-06-03 LAB — GLUCOSE, CAPILLARY
Glucose-Capillary: 218 mg/dL — ABNORMAL HIGH (ref 70–99)
Glucose-Capillary: 190 mg/dL — ABNORMAL HIGH (ref 70–99)

## 2014-06-03 NOTE — Progress Notes (Signed)
Reviewed discharge instructions with pt. Educated about any dose changes of meds and actions that he should avoid while at home. No current questions or concerns. Will be leaving shortly

## 2014-06-03 NOTE — Progress Notes (Signed)
Utilization Review completed.  

## 2014-06-03 NOTE — Progress Notes (Signed)
[facility] PROGRESS NOTE  RHYKER SILVERSMITH PJK:[id number] DOB: 22-May-1929 DOA: 06/01/2014 PCP: [name]., MD  Assessment/Plan: 1. Dizziness. Felt to be related to orthostatic hypotension due to mild dehydration and likely overmedication. Patient was hydrated with intravenous fluids. His medication regimen was adjusted by cardiology and is only on Coreg at this time. Ranexa Imdur and lisinopril are currently on hold. Lasix is to be taken on an as-needed basis. Patient is significantly improved and reports that his dizziness has resolved. Blood pressures also improved, of note was hypotensive on admission. The patient will follow up with cardiology in 1 week to reassess medication management for further adjustments. 2. Chest pain. Resolved on admission. Ruled out for MI with negative cardiac markers. 3. BPH. Continue Flomax. 4. Ischemic cardiomyopathy 5. Disposition. Since patient is clinically improved, he'll be discharged home today.   HPI/Subjective: Dizziness resolved, no chest pain or shortness of breath, feeling good  Objective: Filed Vitals:   06/03/14 0344  BP: 150/65  Pulse: 65  Temp: 98.5 F (36.9 C)  Resp: 18    Intake/Output Summary (Last 24 hours) at 06/03/14 1321 Last data filed at 06/03/14 0919  Gross per 24 hour  Intake   2880 ml  Output   2350 ml  Net    530 ml   Filed Weights   06/01/14 1252 06/02/14 0425 06/03/14 0344  Weight: 79.017 kg (174 lb 3.2 oz) 78.7 kg (173 lb 8 oz) 81.738 kg (180 lb 3.2 oz)    Exam:   General:  NAD  Cardiovascular: S1, S2 RRR  Respiratory: CTA B  Abdomen: soft, nt, nd, bs+  Musculoskeletal: no edema b/l   Data Reviewed: Basic Metabolic Panel:  Recent Labs Lab 06/01/14 0944 06/02/14 0614 06/03/14 0615  NA 136* 137 138  K 4.9 4.4 4.2  CL 98 102 101  CO2 25 23 24   GLUCOSE 222* 183* 200*  BUN 21 16 11   CREATININE 1.04 0.94 0.82  CALCIUM 9.7 9.0 9.2   Liver Function Tests:  Recent Labs Lab  06/01/14 0944 06/02/14 0614  AST 21 20  ALT 24 21  ALKPHOS 86 81  BILITOT 0.5 0.4  PROT 7.1 6.3  ALBUMIN 3.9 3.5   No results for input(s): LIPASE, AMYLASE in the last 168 hours. No results for input(s): AMMONIA in the last 168 hours. CBC:  Recent Labs Lab 06/01/14 0944 06/02/14 0614  WBC 8.1 5.9  NEUTROABS 5.9  --   HGB 13.9 13.4  HCT 41.3 38.9*  MCV 96.9 94.9  PLT 194 153   Cardiac Enzymes:  Recent Labs Lab 06/01/14 0944 06/01/14 1525 06/01/14 2140  TROPONINI <0.30 <0.30 <0.30   BNP (last 3 results)  Recent Labs  08/17/13 2032  PROBNP 1587.0*   CBG:  Recent Labs Lab 06/02/14 1129 06/02/14 1629 06/02/14 2032 06/03/14 0927 06/03/14 1152  GLUCAP 198* 204* 142* 218* 190*    No results found for this or any previous visit (from the past 240 hour(s)).   Studies: No results found.  Scheduled Meds: . aspirin EC  81 mg Oral Daily  . atorvastatin  40 mg Oral q1800  . carvedilol  3.125 mg Oral BID WC  . enoxaparin (LOVENOX) injection  40 mg Subcutaneous Q24H  . insulin aspart  0-15 Units Subcutaneous TID WC  . insulin aspart  0-5 Units Subcutaneous QHS  . pantoprazole  40 mg Oral Daily  . sodium chloride  3 mL Intravenous Q12H  . sodium chloride  3 mL Intravenous Q12H  .  tamsulosin  0.4 mg Oral QPC supper   Continuous Infusions: . sodium chloride 100 mL/hr (06/02/14 1446)    Principal Problem:   Dizziness Active Problems:   BPH (benign prostatic hyperplasia)   Hyperlipidemia   Diabetes mellitus, type II   Cardiomyopathy, ischemic   Coronary atherosclerosis of native coronary artery   Chest pain    Time spent: 75mins    MEMON,JEHANZEB  [facility] Pager [phone]. If 7PM-7AM, please contact night-coverage at [email or web address], password [facility] 06/03/2014, 1:21 PM  LOS: 2 days

## 2014-06-09 ENCOUNTER — Encounter (HOSPITAL_COMMUNITY): Payer: Self-pay | Admitting: Emergency Medicine

## 2014-06-09 ENCOUNTER — Observation Stay (HOSPITAL_COMMUNITY)
Admission: EM | Admit: 2014-06-09 | Discharge: 2014-06-10 | Disposition: A | Discharge status: Home / Self Care | Payer: Medicare Other | Attending: Family Medicine | Admitting: Family Medicine

## 2014-06-09 DIAGNOSIS — Z79899 Other long term (current) drug therapy: Secondary | ICD-10-CM | POA: Insufficient documentation

## 2014-06-09 DIAGNOSIS — Z9861 Coronary angioplasty status: Secondary | ICD-10-CM | POA: Diagnosis not present

## 2014-06-09 DIAGNOSIS — Z8701 Personal history of pneumonia (recurrent): Secondary | ICD-10-CM | POA: Insufficient documentation

## 2014-06-09 DIAGNOSIS — Z8673 Personal history of transient ischemic attack (TIA), and cerebral infarction without residual deficits: Secondary | ICD-10-CM | POA: Diagnosis not present

## 2014-06-09 DIAGNOSIS — Z7982 Long term (current) use of aspirin: Secondary | ICD-10-CM | POA: Insufficient documentation

## 2014-06-09 DIAGNOSIS — Z87891 Personal history of nicotine dependence: Secondary | ICD-10-CM | POA: Insufficient documentation

## 2014-06-09 DIAGNOSIS — Z951 Presence of aortocoronary bypass graft: Secondary | ICD-10-CM | POA: Diagnosis not present

## 2014-06-09 DIAGNOSIS — Z8711 Personal history of peptic ulcer disease: Secondary | ICD-10-CM | POA: Diagnosis not present

## 2014-06-09 DIAGNOSIS — E119 Type 2 diabetes mellitus without complications: Secondary | ICD-10-CM

## 2014-06-09 DIAGNOSIS — G8929 Other chronic pain: Secondary | ICD-10-CM | POA: Diagnosis not present

## 2014-06-09 DIAGNOSIS — I2510 Atherosclerotic heart disease of native coronary artery without angina pectoris: Secondary | ICD-10-CM | POA: Diagnosis present

## 2014-06-09 DIAGNOSIS — R9431 Abnormal electrocardiogram [ECG] [EKG]: Secondary | ICD-10-CM | POA: Diagnosis not present

## 2014-06-09 DIAGNOSIS — I10 Essential (primary) hypertension: Secondary | ICD-10-CM | POA: Diagnosis not present

## 2014-06-09 DIAGNOSIS — E785 Hyperlipidemia, unspecified: Secondary | ICD-10-CM | POA: Diagnosis not present

## 2014-06-09 DIAGNOSIS — Z8781 Personal history of (healed) traumatic fracture: Secondary | ICD-10-CM | POA: Insufficient documentation

## 2014-06-09 DIAGNOSIS — N400 Enlarged prostate without lower urinary tract symptoms: Secondary | ICD-10-CM | POA: Diagnosis present

## 2014-06-09 DIAGNOSIS — I2089 Other forms of angina pectoris: Secondary | ICD-10-CM

## 2014-06-09 DIAGNOSIS — Z9889 Other specified postprocedural states: Secondary | ICD-10-CM | POA: Diagnosis not present

## 2014-06-09 DIAGNOSIS — R079 Chest pain, unspecified: Secondary | ICD-10-CM | POA: Diagnosis not present

## 2014-06-09 DIAGNOSIS — K219 Gastro-esophageal reflux disease without esophagitis: Secondary | ICD-10-CM | POA: Insufficient documentation

## 2014-06-09 DIAGNOSIS — M1990 Unspecified osteoarthritis, unspecified site: Secondary | ICD-10-CM | POA: Diagnosis not present

## 2014-06-09 DIAGNOSIS — I208 Other forms of angina pectoris: Secondary | ICD-10-CM

## 2014-06-09 LAB — BASIC METABOLIC PANEL
Anion gap: 15 (ref 5–15)
BUN: 21 mg/dL (ref 6–23)
CALCIUM: 9.7 mg/dL (ref 8.4–10.5)
CO2: 22 mEq/L (ref 19–32)
Chloride: 96 mEq/L (ref 96–112)
Creatinine, Ser: 0.99 mg/dL (ref 0.50–1.35)
GFR calc non Af Amer: 74 mL/min — ABNORMAL LOW (ref >90)
GFR, EST AFRICAN AMERICAN: 85 mL/min — AB (ref >90)
GLUCOSE: 221 mg/dL — AB (ref 70–99)
POTASSIUM: 4.6 meq/L (ref 3.7–5.3)
SODIUM: 133 meq/L — AB (ref 137–147)

## 2014-06-09 LAB — PHOSPHORUS: Phosphorus: 2.6 mg/dL (ref 2.3–4.6)

## 2014-06-09 LAB — TROPONIN I
Troponin I: <0.3 ng/mL (ref <0.30)
Troponin I: <0.3 ng/mL (ref <0.30)

## 2014-06-09 LAB — CBC
HCT: 40 % (ref 39.0–52.0)
HEMOGLOBIN: 13.6 g/dL (ref 13.0–17.0)
MCH: 32.5 pg (ref 26.0–34.0)
MCHC: 34 g/dL (ref 30.0–36.0)
MCV: 95.7 fL (ref 78.0–100.0)
Platelets: 181 10*3/uL (ref 150–400)
RBC: 4.18 MIL/uL — ABNORMAL LOW (ref 4.22–5.81)
RDW: 12.6 % (ref 11.5–15.5)
WBC: 7.4 10*3/uL (ref 4.0–10.5)

## 2014-06-09 LAB — GLUCOSE, CAPILLARY
GLUCOSE-CAPILLARY: 162 mg/dL — AB (ref 70–99)
GLUCOSE-CAPILLARY: 194 mg/dL — AB (ref 70–99)
Glucose-Capillary: 164 mg/dL — ABNORMAL HIGH (ref 70–99)

## 2014-06-09 LAB — MAGNESIUM: Magnesium: 1.8 mg/dL (ref 1.5–2.5)

## 2014-06-09 MED ORDER — BISMUTH SUBSALICYLATE 262 MG/15ML PO SUSP
30.0000 mL | Freq: Four times a day (QID) | ORAL | Status: DC | PRN
Start: 2014-06-09 — End: 2014-06-10
  Filled 2014-06-09: qty 236

## 2014-06-09 MED ORDER — TAMSULOSIN HCL 0.4 MG PO CAPS
0.4000 mg | ORAL_CAPSULE | Freq: Every day | ORAL | Status: DC
  Administered 2014-06-09: 0.4 mg via ORAL
  Filled 2014-06-09: qty 1

## 2014-06-09 MED ORDER — SIMETHICONE 80 MG PO CHEW: 80.0000 mg | CHEWABLE_TABLET | Freq: Four times a day (QID) | ORAL | Status: DC | PRN

## 2014-06-09 MED ORDER — CARVEDILOL 3.125 MG PO TABS
3.1250 mg | ORAL_TABLET | Freq: Two times a day (BID) | ORAL | Status: DC
  Administered 2014-06-09 – 2014-06-10 (×2): 3.125 mg via ORAL
  Filled 2014-06-09 (×2): qty 1

## 2014-06-09 MED ORDER — HEPARIN SODIUM (PORCINE) 5000 UNIT/ML IJ SOLN
5000.0000 [IU] | Freq: Three times a day (TID) | INTRAMUSCULAR | Status: DC
  Administered 2014-06-09 – 2014-06-10 (×3): 5000 [IU] via SUBCUTANEOUS
  Filled 2014-06-09 (×3): qty 1

## 2014-06-09 MED ORDER — PANTOPRAZOLE SODIUM 40 MG PO TBEC
40.0000 mg | DELAYED_RELEASE_TABLET | Freq: Every day | ORAL | Status: DC
  Administered 2014-06-10: 40 mg via ORAL
  Filled 2014-06-09: qty 1

## 2014-06-09 MED ORDER — ADULT MULTIVITAMIN W/MINERALS CH
1.0000 | ORAL_TABLET | Freq: Every day | ORAL | Status: DC
  Administered 2014-06-10: 1 via ORAL
  Filled 2014-06-09: qty 1

## 2014-06-09 MED ORDER — MORPHINE SULFATE 2 MG/ML IJ SOLN: 1.0000 mg | INTRAMUSCULAR | Status: DC | PRN

## 2014-06-09 MED ORDER — NITROGLYCERIN 0.4 MG SL SUBL
0.4000 mg | SUBLINGUAL_TABLET | SUBLINGUAL | Status: DC | PRN
Start: 2014-06-09 — End: 2014-06-10

## 2014-06-09 MED ORDER — ATORVASTATIN CALCIUM 40 MG PO TABS
40.0000 mg | ORAL_TABLET | Freq: Every day | ORAL | Status: DC
  Administered 2014-06-09: 40 mg via ORAL
  Filled 2014-06-09: qty 1

## 2014-06-09 MED ORDER — TICAGRELOR 90 MG PO TABS
90.0000 mg | ORAL_TABLET | Freq: Two times a day (BID) | ORAL | Status: DC
  Administered 2014-06-09 – 2014-06-10 (×2): 90 mg via ORAL
  Filled 2014-06-09 (×6): qty 1

## 2014-06-09 MED ORDER — ACETAMINOPHEN 325 MG PO TABS: 650.0000 mg | ORAL_TABLET | ORAL | Status: DC | PRN

## 2014-06-09 MED ORDER — FUROSEMIDE 20 MG PO TABS: 20.0000 mg | ORAL_TABLET | Freq: Every day | ORAL | Status: DC | PRN

## 2014-06-09 MED ORDER — INSULIN ASPART 100 UNIT/ML ~~LOC~~ SOLN
0.0000 [IU] | Freq: Three times a day (TID) | SUBCUTANEOUS | Status: DC
Start: 2014-06-09 — End: 2014-06-10
  Administered 2014-06-09 (×2): 3 [IU] via SUBCUTANEOUS
  Administered 2014-06-10: 5 [IU] via SUBCUTANEOUS
  Administered 2014-06-10: 3 [IU] via SUBCUTANEOUS

## 2014-06-09 MED ORDER — BUSPIRONE HCL 5 MG PO TABS
5.0000 mg | ORAL_TABLET | Freq: Two times a day (BID) | ORAL | Status: DC
  Administered 2014-06-09 – 2014-06-10 (×2): 5 mg via ORAL
  Filled 2014-06-09 (×2): qty 1

## 2014-06-09 MED ORDER — ASPIRIN EC 81 MG PO TBEC
81.0000 mg | DELAYED_RELEASE_TABLET | Freq: Every day | ORAL | Status: DC
  Administered 2014-06-10: 81 mg via ORAL
  Filled 2014-06-09: qty 1

## 2014-06-09 MED ORDER — SODIUM CHLORIDE 0.9 % IJ SOLN
3.0000 mL | Freq: Two times a day (BID) | INTRAMUSCULAR | Status: DC
  Administered 2014-06-09 – 2014-06-10 (×3): 3 mL via INTRAVENOUS

## 2014-06-09 MED ORDER — ALBUTEROL SULFATE (2.5 MG/3ML) 0.083% IN NEBU: 2.5000 mg | INHALATION_SOLUTION | Freq: Four times a day (QID) | RESPIRATORY_TRACT | Status: DC | PRN

## 2014-06-09 NOTE — H&P (Signed)
[facility] History and Physical  Jeffrey [name] HCW:[id number] DOB: Aug 02, 1928 DOA: 06/09/2014  Referring physician: Dr. Stevie Kern PCP: [name]., MD   Chief Complaint: chest pain at rest  HPI: Jeffrey [name] is a 80 y.o. male  With history of multivessel CAD status post previous CABG, with known graft disease. Cardiac catheterization on November 6 reportedly revealed severe multilevel CAD, patent LIMA to LAD with associated collateralization to the distal RCA and obtuse marginal system, occluded SVG to the OM1 and OM 2, and occluded SVG to the distal RCA. Patient presents to the ER after a episode of chest discomfort described as an ache that lasted 15-20 minutes. The patient reports he was at rest while this occurred. He otherwise denies any radiation of the chest discomfort. He denies any fevers, cough, or chills. Nothing he is aware of makes the pain better or worse.  Patient was evaluated by the cardiologist who recommended inpatient observation   Review of Systems:  Constitutional:  No weight loss, night sweats, Fevers, chills, fatigue.  HEENT:  No headaches, Difficulty swallowing,Tooth/dental problems,Sore throat,  No sneezing, itching, ear ache, nasal congestion, post nasal drip,  Cardio-vascular:  + chest pain (non currently), Orthopnea, PND, swelling in lower extremities, anasarca, dizziness, palpitations  GI:  No heartburn, indigestion, abdominal pain, nausea, vomiting, diarrhea, change in bowel habits, loss of appetite  Resp:  No shortness of breath with exertion or at rest. No excess mucus, no productive cough, No non-productive cough, No coughing up of blood.No change in color of mucus.No wheezing.No chest wall deformity  Skin:  no rash or lesions.  GU:  no dysuria, change in color of urine, no urgency or frequency. No flank pain.  Musculoskeletal:  No joint pain or swelling. No decreased range of motion. No back pain.  Psych:  No change in mood or  affect. No depression or anxiety. No memory loss.   Past Medical History  Diagnosis Date  . Diabetes mellitus, type II   . Hyperlipidemia   . Coronary atherosclerosis of native coronary artery     a. CABG x 4 in 1989 (VG->OM1->OM2, VG->RCA, LIMA->LAD), b. 05/2010: DES to VG-OM1/OM2, DES to distal LCx. c. NSTEMI in 04/2011 - TO distal LCX stent and VG->OM2. d. 02/2012 NSTEMI DES to VG-OM1/continuation to OM2 occluded. e. inferior STEMI s/p DES to SVG-RAMUS 06/2012. f. inferolat STEMI 09/2012 s/p DES to SVG-interm; g. Lex MV (11/14):  EF 35%, inf-lat scar with small peri-infarct ischemia  . Essential hypertension, benign   . Osteoarthritis   . History of stroke   . History of pneumonia   . Cervical vertebral fracture   . Chronic back pain   . Benign prostatic hypertrophy     History of urinary retention  . Peptic ulcer disease   . Gastroesophageal reflux disease   . Ischemic cardiomyopathy Nov 2015    EF 35% cath, 45-50% by echo   Past Surgical History  Procedure Laterality Date  . Tonsillectomy    . Coronary artery bypass graft  1989  . Coronary angioplasty  10/12, 8/13, 12/13, 3/14    SVG-OM PCI  . Cardiac catheterization  05/19/14    SVG-OM occl- medical Rx   Social History:  reports that he quit smoking about 52 years ago. His smoking use included Cigarettes. He has a 20 pack-year smoking history. His smokeless tobacco use includes Chew. He reports that he does not drink alcohol or use illicit drugs.  No Known Allergies  Family History  Problem Relation  Age of Onset  . Early death      Parents died young  . Appendicitis Mother     Pt was 5 year old  . Heart attack Father 50     Prior to Admission medications   Medication Sig Start Date End Date Taking? Authorizing Provider  acetaminophen (TYLENOL) 325 MG tablet Take 2 tablets (650 mg total) by mouth every 4 (four) hours as needed for headache or mild pain. 05/21/14  Yes Luke K Kilroy, PA-C  albuterol (PROVENTIL  HFA;VENTOLIN HFA) 108 (90 BASE) MCG/ACT inhaler Inhale 2 puffs into the lungs every 6 (six) hours as needed for wheezing or shortness of breath.   Yes Historical Provider, MD  aspirin EC 81 MG tablet Take 81 mg by mouth daily.   Yes Historical Provider, MD  atorvastatin (LIPITOR) 40 MG tablet Take 1 tablet (40 mg total) by mouth daily at 6 PM. 05/29/13  Yes Scott T Kathlen Mody, PA-C  bismuth subsalicylate (PEPTO BISMOL) 262 MG/15ML suspension Take 30 mLs by mouth every 6 (six) hours as needed for indigestion or diarrhea or loose stools.    Yes Historical Provider, MD  busPIRone (BUSPAR) 5 MG tablet Take 5 mg by mouth 2 (two) times daily.   Yes Historical Provider, MD  carvedilol (COREG) 3.125 MG tablet Take 3.125 mg by mouth 2 (two) times daily with a meal.   Yes Historical Provider, MD  furosemide (LASIX) 20 MG tablet Take 20 mg by mouth daily as needed for fluid. Take 1 tablet as needed if 3 lb weight gain. 05/06/13  Yes Lendon Colonel, NP  metFORMIN (GLUCOPHAGE) 500 MG tablet Take 2 tablets (1,000 mg total) by mouth 2 (two) times daily with a meal. 05/22/14  Yes Erlene Quan, PA-C  Multiple Vitamin (MULTIVITAMIN WITH MINERALS) TABS Take 1 tablet by mouth daily.   Yes Historical Provider, MD  nitroGLYCERIN (NITROSTAT) 0.4 MG SL tablet Place 0.4 mg under the tongue every 5 (five) minutes as needed for chest pain.   Yes Historical Provider, MD  pantoprazole (PROTONIX) 40 MG tablet Take 40 mg by mouth daily.   Yes Historical Provider, MD  simethicone (MYLICON) 80 MG chewable tablet Chew 80 mg by mouth every 6 (six) hours as needed for flatulence.   Yes Historical Provider, MD  tamsulosin (FLOMAX) 0.4 MG CAPS Take 0.4 mg by mouth daily after supper.  01/27/13  Yes Historical Provider, MD  ticagrelor (BRILINTA) 90 MG TABS tablet Take 1 tablet (90 mg total) by mouth 2 (two) times daily. 12/16/13  Yes Lendon Colonel, NP   Physical Exam: Filed Vitals:   06/09/14 1012 06/09/14 1030 06/09/14 1130 06/09/14  1200  BP: 110/65 111/73 121/64 123/65  Pulse: 100  78 80  Temp: 97.7 F (36.5 C)     TempSrc: Oral     Resp: 18 18 16 12   Height: 5\' 10"  (1.778 m)     Weight: 78.926 kg (174 lb)     SpO2: 94%       Wt Readings from Last 3 Encounters:  06/09/14 78.926 kg (174 lb)  06/03/14 81.738 kg (180 lb 3.2 oz)  05/19/14 80.4 kg (177 lb 4 oz)    General:  Appears calm and comfortable Eyes: PERRL, normal lids, irises & conjunctiva ENT: grossly normal hearing, lips & tongue Neck: no LAD, masses or thyromegaly Cardiovascular: RRR, no m/r/g. No LE edema. Respiratory: CTA bilaterally, no w/r/r. Normal respiratory effort. Abdomen: soft, nt, nd Skin: no rash or induration seen on  limited exam Musculoskeletal: grossly normal tone BUE/BLE Psychiatric: grossly normal mood and affect, speech fluent and appropriate Neurologic: grossly non-focal.          Labs on Admission:  Basic Metabolic Panel:  Recent Labs Lab 06/03/14 0615 06/09/14 1034  NA 138 133*  K 4.2 4.6  CL 101 96  CO2 24 22  GLUCOSE 200* 221*  BUN 11 21  CREATININE 0.82 0.99  CALCIUM 9.2 9.7   Liver Function Tests: No results for input(s): AST, ALT, ALKPHOS, BILITOT, PROT, ALBUMIN in the last 168 hours. No results for input(s): LIPASE, AMYLASE in the last 168 hours. No results for input(s): AMMONIA in the last 168 hours. CBC:  Recent Labs Lab 06/09/14 1034  WBC 7.4  HGB 13.6  HCT 40.0  MCV 95.7  PLT 181   Cardiac Enzymes:  Recent Labs Lab 06/09/14 1034  TROPONINI <0.30    BNP (last 3 results)  Recent Labs  08/17/13 2032  PROBNP 1587.0*   CBG:  Recent Labs Lab 06/02/14 1629 06/02/14 2032 06/03/14 0927 06/03/14 1152  GLUCAP 204* 142* 218* 190*    Radiological Exams on Admission: No results found.  EKG: Independently reviewed. Sinus rhythm with Inverted T waves at lead III which was not there when compared to prior  Assessment/Plan Active Problems:   Angina at rest/Abnormal ECG -  Cardiology on board and currently recommending observation - Troponin q 6 hours x 3 - telemetry monitoring  - EKG next am  DM - Hold metformin - diabetic diet - SSI   Code Status: full DVT Prophylaxis: Heparin Family Communication: None at bedside Disposition Plan: observation on telemetry  Time spent: > 45 minutes  Velvet Bathe [facility] Pager [phone]

## 2014-06-09 NOTE — ED Notes (Signed)
Report given to floor. VSS. Pt ready for transport. 

## 2014-06-09 NOTE — Consult Note (Signed)
Primary cardiologist: Dr. [name] Consulting cardiologist: Dr. Satira Sark  Reason for consultation: Chest pain  Clinical Summary Mr. Guarisco is an 80 y.o.male with past medical history outlined below, just recently evaluated in consultation by Dr. Bronson Ing approximately one week ago. He has multivessel CAD status post previous CABG, with known graft disease. Cardiac catheterization on November 6 revealed severe multivessel CAD, patent LIMA to LAD with associated collateralization to the distal RCA and obtuse marginal system, occluded SVG to the OM1 and OM 2, and occluded SVG to the distal RCA. Medical therapy was recommended that time. He was initially on Coreg, Imdur, and Ranexa, however he was taken off of Imdur and Ranexa (as well as Lisinopril) within the last week related to symptomatic hypotension and fatigue. His only antianginal is now a low dose beta blocker and as needed nitroglycerin.  He came into the ER this morning after experiencing a 15-20 minute episode of chest pain. He took 2 nitroglycerin and actually felt somewhat worse, lightheaded and diaphoretic. He is now chest pain-free and his blood pressure is stable, systolic 240. ECG reviewed below, did show somewhat more impressive inferolateral ST-T wave abnormalities, although heart rate was faster in comparison to the prior tracing. Initial troponin I level is normal.  I spoke with him about his medications. It is difficult to actually determine what he is taking, he does not know all the names. It sounds like he has been taking Coreg, not sure if he has been on lisinopril or not, although this was supposed to have been stopped as well.   No Known Allergies  Home Medications No current facility-administered medications on file prior to encounter.   Current Outpatient Prescriptions on File Prior to Encounter  Medication Sig Dispense Refill  . acetaminophen (TYLENOL) 325 MG tablet Take 2 tablets (650 mg  total) by mouth every 4 (four) hours as needed for headache or mild pain.    Marland Kitchen albuterol (PROVENTIL HFA;VENTOLIN HFA) 108 (90 BASE) MCG/ACT inhaler Inhale 2 puffs into the lungs every 6 (six) hours as needed for wheezing or shortness of breath.    Marland Kitchen aspirin EC 81 MG tablet Take 81 mg by mouth daily.    Marland Kitchen atorvastatin (LIPITOR) 40 MG tablet Take 1 tablet (40 mg total) by mouth daily at 6 PM. 30 tablet 11  . bismuth subsalicylate (PEPTO BISMOL) 262 MG/15ML suspension Take 30 mLs by mouth every 6 (six) hours as needed for indigestion or diarrhea or loose stools.     . busPIRone (BUSPAR) 5 MG tablet Take 5 mg by mouth 2 (two) times daily.    . carvedilol (COREG) 3.125 MG tablet Take 3.125 mg by mouth 2 (two) times daily with a meal.    . furosemide (LASIX) 20 MG tablet Take 20 mg by mouth daily as needed for fluid. Take 1 tablet as needed if 3 lb weight gain.    . metFORMIN (GLUCOPHAGE) 500 MG tablet Take 2 tablets (1,000 mg total) by mouth 2 (two) times daily with a meal.    . Multiple Vitamin (MULTIVITAMIN WITH MINERALS) TABS Take 1 tablet by mouth daily.    . nitroGLYCERIN (NITROSTAT) 0.4 MG SL tablet Place 0.4 mg under the tongue every 5 (five) minutes as needed for chest pain.    . pantoprazole (PROTONIX) 40 MG tablet Take 40 mg by mouth daily.    . tamsulosin (FLOMAX) 0.4 MG CAPS Take 0.4 mg by mouth daily after supper.     . ticagrelor (  BRILINTA) 90 MG TABS tablet Take 1 tablet (90 mg total) by mouth 2 (two) times daily. 60 tablet 6    Past Medical History  Diagnosis Date  . Diabetes mellitus, type II   . Hyperlipidemia   . Coronary atherosclerosis of native coronary artery     a. CABG x 4 in 1989 (VG->OM1->OM2, VG->RCA, LIMA->LAD), b. 05/2010: DES to VG-OM1/OM2, DES to distal LCx. c. NSTEMI in 04/2011 - TO distal LCX stent and VG->OM2. d. 02/2012 NSTEMI DES to VG-OM1/continuation to OM2 occluded. e. inferior STEMI s/p DES to SVG-RAMUS 06/2012. f. inferolat STEMI 09/2012 s/p DES to SVG-interm;  g. Lex MV (11/14):  EF 35%, inf-lat scar with small peri-infarct ischemia  . Essential hypertension, benign   . Osteoarthritis   . History of stroke   . History of pneumonia   . Cervical vertebral fracture   . Chronic back pain   . Benign prostatic hypertrophy     History of urinary retention  . Peptic ulcer disease   . Gastroesophageal reflux disease   . Ischemic cardiomyopathy Nov 2015    EF 35% cath, 45-50% by echo    Past Surgical History  Procedure Laterality Date  . Tonsillectomy    . Coronary artery bypass graft  1989  . Coronary angioplasty  10/12, 8/13, 12/13, 3/14    SVG-OM PCI  . Cardiac catheterization  05/19/14    SVG-OM occl- medical Rx    Family History  Problem Relation Age of Onset  . Early death      Parents died young  . Appendicitis Mother     Pt was 74 year old  . Heart attack Father 49    Social History Mr. Nehme reports that he quit smoking about 52 years ago. His smoking use included Cigarettes. He has a 20 pack-year smoking history. His smokeless tobacco use includes Chew. Mr. Fullwood reports that he does not drink alcohol.  Review of Systems Complete review of systems negative except as otherwise outlined in the clinical summary and also the following. No palpitations or syncope. No cough, fevers or chills.  Physical Examination Blood pressure 121/64, pulse 78, temperature 97.7 F (36.5 C), temperature source Oral, resp. rate 16, height 5\' 10"  (1.778 m), weight 174 lb (78.926 kg), SpO2 94 %. No intake or output data in the 24 hours ending 06/09/14 1152  Telemetry: Sinus rhythm in the ER.  Patient in no distress. HEENT: Conjunctiva and lids normal, oropharynx clear. Neck: Supple, no elevated JVP or carotid bruits, no thyromegaly. Lungs: Clear to auscultation, nonlabored breathing at rest. Cardiac: Regular rate and rhythm, no S3 or significant systolic murmur, no pericardial rub. Abdomen: Soft, nontender, bowel sounds present, no  guarding or rebound. Extremities: No pitting edema, distal pulses 2+. Skin: Warm and dry. Musculoskeletal: No kyphosis. Neuropsychiatric: Alert and oriented x3, affect grossly appropriate.   Lab Results  Basic Metabolic Panel:  Recent Labs Lab 06/03/14 0615 06/09/14 1034  NA 138 133*  K 4.2 4.6  CL 101 96  CO2 24 22  GLUCOSE 200* 221*  BUN 11 21  CREATININE 0.82 0.99  CALCIUM 9.2 9.7    CBC:  Recent Labs Lab 06/09/14 1034  WBC 7.4  HGB 13.6  HCT 40.0  MCV 95.7  PLT 181    Cardiac Enzymes:  Recent Labs Lab 06/09/14 1034  TROPONINI <0.30    ECG Sinus tachycardia with PVCs, inferolateral ST-T wave abnormalities consistent with potential ischemia and/or repolarization. Somewhat more prominent compared to prior tracing with  better controlled heart rate.  Imaging (06/01/2014): EXAM: PORTABLE CHEST - 1 VIEW  COMPARISON: May 18, 2014.  FINDINGS: The heart size and mediastinal contours are within normal limits. No pneumothorax or pleural effusion is noted. Status post coronary artery bypass graft. Both lungs are clear. The visualized skeletal structures are unremarkable.  IMPRESSION: No acute cardiopulmonary abnormality seen.   Impression  1. Episode of angina at rest, currently resolved. ECG and initial cardiac markers reviewed above. Patient has a known history of multivessel and graft disease, recently evaluated at heart catheterization with recommendation for medical therapy. At the present time however, his medical regimen is fairly limited following recent adjustments made in the setting of symptomatic hypotension and fatigue.  2. Multivessel CAD status post CABG, occluded SVG to OM1 and OM 2, occluded SVG to RCA, patent LIMA to LAD, collaterals from left to right. LVEF 45-50% by most recent echocardiogram.  3. Type 2 diabetes mellitus.  4. Essential hypertension, recent symptomatic hypotension limiting medical therapy.  5.  Hyperlipidemia, on statin therapy.  6. History of stroke.   Recommendations  Discussed with patient and Dr. Stevie Kern. Would recommend observation admission to the hospital, cycle cardiac markers, and focus on determining a baseline antianginal regimen that he can both tolerate in terms of blood pressure and also angina control. Would continue aspirin and Brilinta, restart Imdur 30 mg daily, continue Coreg at 3.125 mg twice daily, and make sure that he is off lisinopril. Depending on how he does and further adjustments, might be able to resume Ranexa at low dose as well, but would take this in steps. Probably does not need further ischemic testing at this time.  Satira Sark, M.D., F.A.C.C.

## 2014-06-09 NOTE — ED Notes (Signed)
Pt reports chest pain onset 30 minutes prior to arrival. Pt reports took two nitro SL prior to arrival. Pt denies any cp at this time. Pt reports after taking nitro broke out in a sweat. Pt clammy in triage. nad noted.

## 2014-06-09 NOTE — ED Provider Notes (Signed)
CSN: [id number]     Arrival date & time 06/09/14  1003 History   First MD Initiated Contact with Patient 06/09/14 1016     Chief Complaint  Patient presents with  . Chest Pain     (Consider location/radiation/quality/duration/timing/severity/associated sxs/prior Treatment) HPI 80 year old male with coronary artery disease with about 6 ED visits this year for chest pain syndrome with cardiac catheterization this month showing no new lesions amenable to intervention with total occlusion and collaterals of some prior lesions; patient is now pain-free but had about a 20 minute episode of dull chest ache again today with no radiation or associated symptoms with no lightheadedness no shortness of breath no sweats no nausea but did take 2 nitroglycerin and after taking nitroglycerin felt sweaty and lightheaded transiently resolved and his chest pain resolved as well. He is now asymptomatic. He is no fever no cough no shortness breath no abdominal pain no other concerns. He took his aspirin today already. Past Medical History  Diagnosis Date  . Diabetes mellitus, type II   . Hyperlipidemia   . Coronary atherosclerosis of native coronary artery     a. CABG x 4 in 1989 (VG->OM1->OM2, VG->RCA, LIMA->LAD), b. 05/2010: DES to VG-OM1/OM2, DES to distal LCx. c. NSTEMI in 04/2011 - TO distal LCX stent and VG->OM2. d. 02/2012 NSTEMI DES to VG-OM1/continuation to OM2 occluded. e. inferior STEMI s/p DES to SVG-RAMUS 06/2012. f. inferolat STEMI 09/2012 s/p DES to SVG-interm; g. Lex MV (11/14):  EF 35%, inf-lat scar with small peri-infarct ischemia  . Essential hypertension, benign   . Osteoarthritis   . History of stroke   . History of pneumonia   . Cervical vertebral fracture   . Chronic back pain   . Benign prostatic hypertrophy     History of urinary retention  . Peptic ulcer disease   . Gastroesophageal reflux disease   . Ischemic cardiomyopathy Nov 2015    EF 35% cath, 45-50% by echo   Past Surgical  History  Procedure Laterality Date  . Tonsillectomy    . Coronary artery bypass graft  1989  . Coronary angioplasty  10/12, 8/13, 12/13, 3/14    SVG-OM PCI  . Cardiac catheterization  05/19/14    SVG-OM occl- medical Rx   Family History  Problem Relation Age of Onset  . Early death      Parents died young  . Appendicitis Mother     Pt was 29 year old  . Heart attack Father 94   History  Substance Use Topics  . Smoking status: Former Smoker -- 2.00 packs/day for 10 years    Types: Cigarettes    Quit date: 07/14/1961  . Smokeless tobacco: Current User    Types: Chew  . Alcohol Use: No    Review of Systems 10 Systems reviewed and are negative for acute change except as noted in the HPI.   Allergies  Review of patient's allergies indicates no known allergies.  Home Medications   Prior to Admission medications   Medication Sig Start Date End Date Taking? Authorizing Provider  acetaminophen (TYLENOL) 325 MG tablet Take 2 tablets (650 mg total) by mouth every 4 (four) hours as needed for headache or mild pain. 05/21/14  Yes Luke K Kilroy, PA-C  albuterol (PROVENTIL HFA;VENTOLIN HFA) 108 (90 BASE) MCG/ACT inhaler Inhale 2 puffs into the lungs every 6 (six) hours as needed for wheezing or shortness of breath.   Yes Historical Provider, MD  aspirin EC 81 MG tablet Take 81  mg by mouth daily.   Yes Historical Provider, MD  atorvastatin (LIPITOR) 40 MG tablet Take 1 tablet (40 mg total) by mouth daily at 6 PM. 05/29/13  Yes Scott T Kathlen Mody, PA-C  bismuth subsalicylate (PEPTO BISMOL) 262 MG/15ML suspension Take 30 mLs by mouth every 6 (six) hours as needed for indigestion or diarrhea or loose stools.    Yes Historical Provider, MD  busPIRone (BUSPAR) 5 MG tablet Take 5 mg by mouth 2 (two) times daily.   Yes Historical Provider, MD  carvedilol (COREG) 3.125 MG tablet Take 3.125 mg by mouth 2 (two) times daily with a meal.   Yes Historical Provider, MD  furosemide (LASIX) 20 MG tablet Take  20 mg by mouth daily as needed for fluid. Take 1 tablet as needed if 3 lb weight gain. 05/06/13  Yes Lendon Colonel, NP  metFORMIN (GLUCOPHAGE) 500 MG tablet Take 2 tablets (1,000 mg total) by mouth 2 (two) times daily with a meal. 05/22/14  Yes Erlene Quan, PA-C  Multiple Vitamin (MULTIVITAMIN WITH MINERALS) TABS Take 1 tablet by mouth daily.   Yes Historical Provider, MD  nitroGLYCERIN (NITROSTAT) 0.4 MG SL tablet Place 0.4 mg under the tongue every 5 (five) minutes as needed for chest pain.   Yes Historical Provider, MD  pantoprazole (PROTONIX) 40 MG tablet Take 40 mg by mouth daily.   Yes Historical Provider, MD  simethicone (MYLICON) 80 MG chewable tablet Chew 80 mg by mouth every 6 (six) hours as needed for flatulence.   Yes Historical Provider, MD  tamsulosin (FLOMAX) 0.4 MG CAPS Take 0.4 mg by mouth daily after supper.  01/27/13  Yes Historical Provider, MD  ticagrelor (BRILINTA) 90 MG TABS tablet Take 1 tablet (90 mg total) by mouth 2 (two) times daily. 12/16/13  Yes Lendon Colonel, NP   BP 123/69 mmHg  Pulse 73  Temp(Src) 97.9 F (36.6 C) (Oral)  Resp 18  Ht 5\' 10"  (1.778 m)  Wt 175 lb 8 oz (79.606 kg)  BMI 25.18 kg/m2  SpO2 99% Physical Exam  Constitutional:  Awake, alert, nontoxic appearance.  HENT:  Head: Atraumatic.  Eyes: Right eye exhibits no discharge. Left eye exhibits no discharge.  Neck: Neck supple.  Cardiovascular: Normal rate and regular rhythm.   No murmur heard. Pulmonary/Chest: Effort normal and breath sounds normal. No respiratory distress. He has no wheezes. He has no rales. He exhibits no tenderness.  Abdominal: Soft. Bowel sounds are normal. He exhibits no distension and no mass. There is no tenderness. There is no rebound and no guarding.  Musculoskeletal: He exhibits no edema or tenderness.  Baseline ROM, no obvious new focal weakness.  Neurological: He is alert.  Mental status and motor strength appears baseline for patient and situation.  Skin:  No rash noted.  Psychiatric: He has a normal mood and affect.  Nursing note and vitals reviewed.   ED Course  Procedures (including critical care time) Patient understand and agree with initial ED impression and plan with expectations set for ED visit. Cards saw Pt in ED and recs [facility] and Cards will make cardiac med recs to [facility]. Labs Review Labs Reviewed  CBC - Abnormal; Notable for the following:    RBC 4.18 (*)    All other components within normal limits  BASIC METABOLIC PANEL - Abnormal; Notable for the following:    Sodium 133 (*)    Glucose, Bld 221 (*)    GFR calc non Af Amer 74 (*)  GFR calc Af Amer 85 (*)    All other components within normal limits  GLUCOSE, CAPILLARY - Abnormal; Notable for the following:    Glucose-Capillary 164 (*)    All other components within normal limits  GLUCOSE, CAPILLARY - Abnormal; Notable for the following:    Glucose-Capillary 162 (*)    All other components within normal limits  GLUCOSE, CAPILLARY - Abnormal; Notable for the following:    Glucose-Capillary 194 (*)    All other components within normal limits  TROPONIN I  MAGNESIUM  PHOSPHORUS  TROPONIN I  TROPONIN I  TROPONIN I  BASIC METABOLIC PANEL  CBC    Imaging Review No results found.   EKG Interpretation   Date/Time:  Friday June 09 2014 10:11:21 EST Ventricular Rate:  101 PR Interval:  160 QRS Duration: 111 QT Interval:  368 QTC Calculation: 477 R Axis:   93 Text Interpretation:  Sinus tachycardia Multiple ventricular premature  complexes Inferior infarct, age indeterminate Abnrm T, consider ischemia,  anterolateral lds Compared to previous tracing T wave inversion Inferior  leads NOW PRESENT Confirmed by [employer]  MD, Jenny Reichmann ([id number]) on 06/09/2014  10:22:15 AM      MDM   Final diagnoses:  Chest pain, unspecified chest pain type  Abnormal ECG    The patient appears reasonably stabilized for admission considering the current resources, flow,  and capabilities available in the ED at this time, and I doubt any other [facility] requiring further screening and/or treatment in the ED prior to admission.   Babette Relic, MD 06/09/14 2350

## 2014-06-10 DIAGNOSIS — R079 Chest pain, unspecified: Secondary | ICD-10-CM | POA: Diagnosis not present

## 2014-06-10 LAB — CBC
HCT: 38.8 % — ABNORMAL LOW (ref 39.0–52.0)
Hemoglobin: 13.3 g/dL (ref 13.0–17.0)
MCH: 32.4 pg (ref 26.0–34.0)
MCHC: 34.3 g/dL (ref 30.0–36.0)
MCV: 94.6 fL (ref 78.0–100.0)
PLATELETS: 159 10*3/uL (ref 150–400)
RBC: 4.1 MIL/uL — AB (ref 4.22–5.81)
RDW: 12.6 % (ref 11.5–15.5)
WBC: 5.9 10*3/uL (ref 4.0–10.5)

## 2014-06-10 LAB — BASIC METABOLIC PANEL
ANION GAP: 10 (ref 5–15)
BUN: 14 mg/dL (ref 6–23)
CALCIUM: 9 mg/dL (ref 8.4–10.5)
CO2: 26 mEq/L (ref 19–32)
Chloride: 100 mEq/L (ref 96–112)
Creatinine, Ser: 0.8 mg/dL (ref 0.50–1.35)
GFR calc non Af Amer: 80 mL/min — ABNORMAL LOW (ref >90)
Glucose, Bld: 223 mg/dL — ABNORMAL HIGH (ref 70–99)
Potassium: 4.3 mEq/L (ref 3.7–5.3)
SODIUM: 136 meq/L — AB (ref 137–147)

## 2014-06-10 LAB — GLUCOSE, CAPILLARY
GLUCOSE-CAPILLARY: 212 mg/dL — AB (ref 70–99)
Glucose-Capillary: 197 mg/dL — ABNORMAL HIGH (ref 70–99)

## 2014-06-10 LAB — TROPONIN I

## 2014-06-10 MED ORDER — ISOSORBIDE MONONITRATE ER 30 MG PO TB24: 30.0000 mg | ORAL_TABLET | Freq: Every day | ORAL | Status: DC

## 2014-06-10 NOTE — Discharge Summary (Signed)
Physician Discharge Summary  Jeffrey [name] BPZ:[id number] DOB: 04-08-1929 DOA: 06/09/2014  PCP: [name]., MD  Admit date: 06/09/2014 Discharge date: 06/10/2014  Time spent: > 35 minutes  Recommendations for Outpatient Follow-up:  1. Recommend patient follow-up with cardiologist as listed below. 2. Monitor blood pressures as patient was recently started on Imdur per recommendations of cardiology.  Discharge Diagnoses:  Active Problems:   BPH (benign prostatic hyperplasia)   Diabetes mellitus, type II   Coronary atherosclerosis of native coronary artery   Angina at rest   Abnormal ECG   Chest pain at rest   Discharge Condition: Stable  Diet recommendation: Low sodium heart healthy  Filed Weights   06/09/14 1012 06/09/14 1300  Weight: 78.926 kg (174 lb) 79.606 kg (175 lb 8 oz)    History of present illness:  Patient is an 80 year old with history of multivessel CAD status post previous CABG, with known graft disease. Cardiac catheterization on November 6 reportedly revealed severe multilevel CAD, patent LIMA to LAD with associated collateralization to the distal RCA and obtuse marginal system, occluded SVG to the OM1 and OM 2, and occluded SVG to the distal RCA. Who presented to the ED complaining of chest discomfort.  Hospital Course:  Chest discomfort - Resolved without any intervention. Troponins 3 negative. - EKG normal sinus rhythm  - Recommend patient follow-up with cardiologist after discharge for further evaluation recommendations.  For chronic medical conditions listed above patient will continue home medication regimen.  Procedures:  None this admission  Consultations:  Cardiology  Discharge Exam: Filed Vitals:   06/10/14 0619  BP: 125/66  Pulse: 68  Temp: 98.3 F (36.8 C)  Resp: 18    General: Patient in no acute distress, alert and awake Cardiovascular: Regular rate and rhythm, no rubs Respiratory: Clear to auscultation bilaterally,  no wheezes  Discharge Instructions You were cared for by a hospitalist during your hospital stay. If you have any questions about your discharge medications or the care you received while you were in the hospital after you are discharged, you can call the unit and asked to speak with the hospitalist on call if the hospitalist that took care of you is not available. Once you are discharged, your primary care physician will handle any further medical issues. Please note that NO REFILLS for any discharge medications will be authorized once you are discharged, as it is imperative that you return to your primary care physician (or establish a relationship with a primary care physician if you do not have one) for your aftercare needs so that they can reassess your need for medications and monitor your lab values.  Discharge Instructions    Call MD for:  difficulty breathing, headache or visual disturbances    Complete by:  As directed      Call MD for:  persistant nausea and vomiting    Complete by:  As directed      Call MD for:  severe uncontrolled pain    Complete by:  As directed      Call MD for:  temperature >100.4    Complete by:  As directed      Diet - low sodium heart healthy    Complete by:  As directed      Discharge instructions    Complete by:  As directed   Please be sure to follow-up with your cardiologist within next 1-2 weeks or sooner should any new concerns arise.     Increase activity slowly  Complete by:  As directed           Current Discharge Medication List    START taking these medications   Details  isosorbide mononitrate (IMDUR) 30 MG 24 hr tablet Take 1 tablet (30 mg total) by mouth daily. Qty: 30 tablet, Refills: 0      CONTINUE these medications which have NOT CHANGED   Details  acetaminophen (TYLENOL) 325 MG tablet Take 2 tablets (650 mg total) by mouth every 4 (four) hours as needed for headache or mild pain.    albuterol (PROVENTIL HFA;VENTOLIN HFA)  108 (90 BASE) MCG/ACT inhaler Inhale 2 puffs into the lungs every 6 (six) hours as needed for wheezing or shortness of breath.    aspirin EC 81 MG tablet Take 81 mg by mouth daily.    atorvastatin (LIPITOR) 40 MG tablet Take 1 tablet (40 mg total) by mouth daily at 6 PM. Qty: 30 tablet, Refills: 11    bismuth subsalicylate (PEPTO BISMOL) 262 MG/15ML suspension Take 30 mLs by mouth every 6 (six) hours as needed for indigestion or diarrhea or loose stools.     carvedilol (COREG) 3.125 MG tablet Take 3.125 mg by mouth 2 (two) times daily with a meal.    furosemide (LASIX) 20 MG tablet Take 20 mg by mouth daily as needed for fluid. Take 1 tablet as needed if 3 lb weight gain.    metFORMIN (GLUCOPHAGE) 500 MG tablet Take 2 tablets (1,000 mg total) by mouth 2 (two) times daily with a meal.    Multiple Vitamin (MULTIVITAMIN WITH MINERALS) TABS Take 1 tablet by mouth daily.    nitroGLYCERIN (NITROSTAT) 0.4 MG SL tablet Place 0.4 mg under the tongue every 5 (five) minutes as needed for chest pain.    pantoprazole (PROTONIX) 40 MG tablet Take 40 mg by mouth daily.    simethicone (MYLICON) 80 MG chewable tablet Chew 80 mg by mouth every 6 (six) hours as needed for flatulence.    tamsulosin (FLOMAX) 0.4 MG CAPS Take 0.4 mg by mouth daily after supper.     ticagrelor (BRILINTA) 90 MG TABS tablet Take 1 tablet (90 mg total) by mouth 2 (two) times daily. Qty: 60 tablet, Refills: 6      STOP taking these medications     busPIRone (BUSPAR) 5 MG tablet        No Known Allergies    The results of significant diagnostics from this hospitalization (including imaging, microbiology, ancillary and laboratory) are listed below for reference.    Significant Diagnostic Studies: Dg Chest Portable 1 View  06/01/2014   CLINICAL DATA:  Chest pain.  EXAM: PORTABLE CHEST - 1 VIEW  COMPARISON:  May 18, 2014.  FINDINGS: The heart size and mediastinal contours are within normal limits. No pneumothorax or  pleural effusion is noted. Status post coronary artery bypass graft. Both lungs are clear. The visualized skeletal structures are unremarkable.  IMPRESSION: No acute cardiopulmonary abnormality seen.   Electronically Signed   By: Sabino Dick M.D.   On: 06/01/2014 10:06   Dg Chest Portable 1 View  05/18/2014   CLINICAL DATA:  Chest pain and diaphoresis.  Weakness  EXAM: PORTABLE CHEST - 1 VIEW  COMPARISON:  05/06/2014  FINDINGS: The patient is status post median sternotomy and CABG procedure. Normal heart size. No pleural effusion or edema. No airspace consolidation.  IMPRESSION: 1. No active cardiopulmonary abnormalities.   Electronically Signed   By: Kerby Moors M.D.   On: 05/18/2014 18:33  Microbiology: No results found for this or any previous visit (from the past 240 hour(s)).   Labs: Basic Metabolic Panel:  Recent Labs Lab 06/09/14 1034 06/09/14 1305 06/10/14 0638  NA 133*  --  136*  K 4.6  --  4.3  CL 96  --  100  CO2 22  --  26  GLUCOSE 221*  --  223*  BUN 21  --  14  CREATININE 0.99  --  0.80  CALCIUM 9.7  --  9.0  MG  --  1.8  --   PHOS  --  2.6  --    Liver Function Tests: No results for input(s): AST, ALT, ALKPHOS, BILITOT, PROT, ALBUMIN in the last 168 hours. No results for input(s): LIPASE, AMYLASE in the last 168 hours. No results for input(s): AMMONIA in the last 168 hours. CBC:  Recent Labs Lab 06/09/14 1034 06/10/14 0638  WBC 7.4 5.9  HGB 13.6 13.3  HCT 40.0 38.8*  MCV 95.7 94.6  PLT 181 159   Cardiac Enzymes:  Recent Labs Lab 06/09/14 1034 06/09/14 1305 06/09/14 1837 06/10/14 0142  TROPONINI <0.30 <0.30 <0.30 <0.30   BNP: BNP (last 3 results)  Recent Labs  08/17/13 2032  PROBNP 1587.0*   CBG:  Recent Labs Lab 06/09/14 1324 06/09/14 1659 06/09/14 2037 06/10/14 0730 06/10/14 1155  GLUCAP 164* 162* 194* 212* 197*       Signed:  Velvet Bathe  [facility] 06/10/2014, 1:31 PM

## 2014-06-10 NOTE — Progress Notes (Signed)
UR completed 

## 2014-06-10 NOTE — Progress Notes (Signed)
Pt is to be discharged home today. Pt is in no acute distress, IV is out, all paperwork has been reviewed/discussed with patient, and there are no questions/concerns at this time. Assessment is unchanged from this morning. Pt is to be accompanied downstairs by staff and family via wheelchair.

## 2014-06-21 ENCOUNTER — Ambulatory Visit (INDEPENDENT_AMBULATORY_CARE_PROVIDER_SITE_OTHER): Payer: Medicare Other | Admitting: Cardiovascular Disease

## 2014-06-21 ENCOUNTER — Encounter: Payer: Self-pay | Admitting: Cardiovascular Disease

## 2014-06-21 VITALS — BP 106/60 | HR 78 | Ht 70.0 in | Wt 179.0 lb

## 2014-06-21 DIAGNOSIS — E785 Hyperlipidemia, unspecified: Secondary | ICD-10-CM

## 2014-06-21 DIAGNOSIS — I25708 Atherosclerosis of coronary artery bypass graft(s), unspecified, with other forms of angina pectoris: Secondary | ICD-10-CM

## 2014-06-21 DIAGNOSIS — I2510 Atherosclerotic heart disease of native coronary artery without angina pectoris: Secondary | ICD-10-CM

## 2014-06-21 DIAGNOSIS — Z87898 Personal history of other specified conditions: Secondary | ICD-10-CM

## 2014-06-21 DIAGNOSIS — I255 Ischemic cardiomyopathy: Secondary | ICD-10-CM

## 2014-06-21 DIAGNOSIS — E860 Dehydration: Secondary | ICD-10-CM

## 2014-06-21 DIAGNOSIS — I10 Essential (primary) hypertension: Secondary | ICD-10-CM

## 2014-06-21 DIAGNOSIS — R531 Weakness: Secondary | ICD-10-CM

## 2014-06-21 DIAGNOSIS — Z9289 Personal history of other medical treatment: Secondary | ICD-10-CM

## 2014-06-21 NOTE — Patient Instructions (Signed)
Your physician recommends that you schedule a follow-up appointment on Wednesday December 16th at [place] physician recommends that you return for lab work BMP/CBC/UA  Your physician recommends that you continue on your current medications as directed. Please refer to the Current Medication list given to you today.  Thank you for choosing [facility]!!

## 2014-06-21 NOTE — Progress Notes (Signed)
Patient ID: Jeffrey [name], male   DOB: 09/24/1928, 80 y.o.   MRN: [id number]      SUBJECTIVE: I saw Jeffrey [name] in consultation last month after he was experiencing hypotension and dizziness from medication changes made at a prior hospital discharge. He was also dehydrated. He was given IV fluids and Ranexa, Imdur, and lisinopril were stopped, and he was discharged on Coreg and prn SL nitroglycerin. He was then readmitted with chest pain and Imdur was reinitiated. He ruled out for an ACS. He had been feeling well since discharge until today. He feels weak and "dragging". He denies chest pain, leg swelling, and shortness of breath. He checked his BP at home before coming here and it was 123/62. He denies fevers/chills and dysuria. He drinks the generic version of 7-Up at home and hardly drinks water.   Review of Systems: As per "subjective", otherwise negative.  No Known Allergies  Current Outpatient Prescriptions  Medication Sig Dispense Refill  . acetaminophen (TYLENOL) 325 MG tablet Take 2 tablets (650 mg total) by mouth every 4 (four) hours as needed for headache or mild pain.    Marland Kitchen albuterol (PROVENTIL HFA;VENTOLIN HFA) 108 (90 BASE) MCG/ACT inhaler Inhale 2 puffs into the lungs every 6 (six) hours as needed for wheezing or shortness of breath.    Marland Kitchen aspirin EC 81 MG tablet Take 81 mg by mouth daily.    Marland Kitchen atorvastatin (LIPITOR) 40 MG tablet Take 1 tablet (40 mg total) by mouth daily at 6 PM. 30 tablet 11  . bismuth subsalicylate (PEPTO BISMOL) 262 MG/15ML suspension Take 30 mLs by mouth every 6 (six) hours as needed for indigestion or diarrhea or loose stools.     . carvedilol (COREG) 3.125 MG tablet Take 3.125 mg by mouth 2 (two) times daily with a meal.    . furosemide (LASIX) 20 MG tablet Take 20 mg by mouth daily as needed for fluid. Take 1 tablet as needed if 3 lb weight gain.    . isosorbide mononitrate (IMDUR) 30 MG 24 hr tablet Take 1 tablet (30 mg total) by mouth daily. 30  tablet 0  . metFORMIN (GLUCOPHAGE) 500 MG tablet Take 2 tablets (1,000 mg total) by mouth 2 (two) times daily with a meal.    . Multiple Vitamin (MULTIVITAMIN WITH MINERALS) TABS Take 1 tablet by mouth daily.    . nitroGLYCERIN (NITROSTAT) 0.4 MG SL tablet Place 0.4 mg under the tongue every 5 (five) minutes as needed for chest pain.    . pantoprazole (PROTONIX) 40 MG tablet Take 40 mg by mouth daily.    . simethicone (MYLICON) 80 MG chewable tablet Chew 80 mg by mouth every 6 (six) hours as needed for flatulence.    . tamsulosin (FLOMAX) 0.4 MG CAPS Take 0.4 mg by mouth daily after supper.     . ticagrelor (BRILINTA) 90 MG TABS tablet Take 1 tablet (90 mg total) by mouth 2 (two) times daily. 60 tablet 6   No current facility-administered medications for this visit.    Past Medical History  Diagnosis Date  . Diabetes mellitus, type II   . Hyperlipidemia   . Coronary atherosclerosis of native coronary artery     a. CABG x 4 in 1989 (VG->OM1->OM2, VG->RCA, LIMA->LAD), b. 05/2010: DES to VG-OM1/OM2, DES to distal LCx. c. NSTEMI in 04/2011 - TO distal LCX stent and VG->OM2. d. 02/2012 NSTEMI DES to VG-OM1/continuation to OM2 occluded. e. inferior STEMI s/p DES to [facility] 06/2012. f. inferolat STEMI  09/2012 s/p DES to SVG-interm; g. Lex MV (11/14):  EF 35%, inf-lat scar with small peri-infarct ischemia  . Essential hypertension, benign   . Osteoarthritis   . History of stroke   . History of pneumonia   . Cervical vertebral fracture   . Chronic back pain   . Benign prostatic hypertrophy     History of urinary retention  . Peptic ulcer disease   . Gastroesophageal reflux disease   . Ischemic cardiomyopathy Nov 2015    EF 35% cath, 45-50% by echo    Past Surgical History  Procedure Laterality Date  . Tonsillectomy    . Coronary artery bypass graft  1989  . Coronary angioplasty  10/12, 8/13, 12/13, 3/14    SVG-OM PCI  . Cardiac catheterization  05/19/14    SVG-OM occl- medical Rx     History   Social History  . Marital Status: Divorced    Spouse Name: N/A    Number of Children: N/A  . Years of Education: N/A   Occupational History  . Retired     Designer, television/film set   Social History Main Topics  . Smoking status: Former Smoker -- 2.00 packs/day for 10 years    Types: Cigarettes    Start date: 07/14/1950    Quit date: 07/14/1961  . Smokeless tobacco: Current User    Types: Chew  . Alcohol Use: No  . Drug Use: Not on file  . Sexual Activity: No   Other Topics Concern  . Not on file   Social History Narrative   ** Merged History Encounter **...   He was raised by his uncle.  His mother deceased when   he was 53 year old with appendicitis. Father deceased from an MI at age   36.  He has 1 sister, but he does not know her health status as they   have been separated.            Filed Vitals:   06/21/14 1326  Height: 5\' 10"  (1.778 m)  Weight: 179 lb (81.194 kg)   BP 100/56 106/60  Pulse 78   PHYSICAL EXAM General: NAD HEENT: Normal. Neck: No JVD, no thyromegaly. Lungs: Clear to auscultation bilaterally with normal respiratory effort. CV: Nondisplaced PMI.  Regular rate and rhythm, normal S1/S2, no S3/S4, no murmur. No pretibial or periankle edema.  No carotid bruit.  Normal pedal pulses.  Abdomen: Soft, nontender, no hepatosplenomegaly, no distention.  Neurologic: Alert and oriented x 3.  Psych: Normal affect. Skin: Normal. Musculoskeletal: Normal range of motion, no gross deformities. Extremities: No clubbing or cyanosis.   ECG: Most recent ECG reviewed.      ASSESSMENT AND PLAN: 1. CAD: Stable ischemic heart disease. Continue current therapy with ASA, statin, low-dose Coreg, Imdur, and Brilinta (given multiple coronary interventions). 2. Essential HTN: Controlled with Coreg.  3. Hyperlipidemia: Continue Lipitor 40 mg daily. 4. Weakness: Unclear etiology. No obvious respiratory or GU symptoms to suggest infection. I will check a UA, CBC, and  BMET. He may be somewhat dehydrated given his lack of adequate water intake, which I emphasized. He has not used Lasix recently.  Dispo: f/u 1 week.  [name], M.D., F.A.C.C.

## 2014-06-22 ENCOUNTER — Telehealth: Payer: Self-pay | Admitting: *Deleted

## 2014-06-22 ENCOUNTER — Encounter (HOSPITAL_COMMUNITY): Payer: Self-pay | Admitting: Cardiovascular Disease

## 2014-06-22 LAB — URINALYSIS
Bilirubin Urine: NEGATIVE
GLUCOSE, UA: 100 mg/dL — AB
Hgb urine dipstick: NEGATIVE
Leukocytes, UA: NEGATIVE
Nitrite: NEGATIVE
Protein, ur: 30 mg/dL — AB
SPECIFIC GRAVITY, URINE: 1.029 (ref 1.005–1.030)
Urobilinogen, UA: 0.2 mg/dL (ref 0.0–1.0)
pH: 7.5 (ref 5.0–8.0)

## 2014-06-22 LAB — CBC WITH DIFFERENTIAL/PLATELET
Basophils Absolute: 0 10*3/uL (ref 0.0–0.1)
Basophils Relative: 0 % (ref 0–1)
EOS ABS: 0.2 10*3/uL (ref 0.0–0.7)
Eosinophils Relative: 2 % (ref 0–5)
HEMATOCRIT: 40.1 % (ref 39.0–52.0)
Hemoglobin: 13.1 g/dL (ref 13.0–17.0)
Lymphocytes Relative: 24 % (ref 12–46)
Lymphs Abs: 1.9 10*3/uL (ref 0.7–4.0)
MCH: 31.2 pg (ref 26.0–34.0)
MCHC: 32.7 g/dL (ref 30.0–36.0)
MCV: 95.5 fL (ref 78.0–100.0)
MONOS PCT: 8 % (ref 3–12)
MPV: 11 fL (ref 9.4–12.4)
Monocytes Absolute: 0.6 10*3/uL (ref 0.1–1.0)
NEUTROS ABS: 5.2 10*3/uL (ref 1.7–7.7)
Neutrophils Relative %: 66 % (ref 43–77)
Platelets: 193 10*3/uL (ref 150–400)
RBC: 4.2 MIL/uL — ABNORMAL LOW (ref 4.22–5.81)
RDW: 13.1 % (ref 11.5–15.5)
WBC: 7.9 10*3/uL (ref 4.0–10.5)

## 2014-06-22 LAB — BASIC METABOLIC PANEL WITH GFR
BUN: 17 mg/dL (ref 6–23)
CO2: 28 meq/L (ref 19–32)
Calcium: 9.6 mg/dL (ref 8.4–10.5)
Chloride: 100 mEq/L (ref 96–112)
Creat: 0.94 mg/dL (ref 0.50–1.35)
GFR, Est African American: 86 mL/min
GFR, Est Non African American: 75 mL/min
GLUCOSE: 171 mg/dL — AB (ref 70–99)
POTASSIUM: 4.3 meq/L (ref 3.5–5.3)
SODIUM: 136 meq/L (ref 135–145)

## 2014-06-22 NOTE — Telephone Encounter (Signed)
Pt made aware. States he feels better than yesterday, forwarded results to Dr. Gerarda Fraction

## 2014-06-22 NOTE — Telephone Encounter (Signed)
-----   Message from Jeffrey Commons, MD sent at 06/22/2014  9:05 AM EST ----- CBC, BMET, UA all normal. Please call pt to see how he is feeling. He c/o weakness/fatigue yesterday.

## 2014-06-28 ENCOUNTER — Encounter: Payer: Self-pay | Admitting: Cardiovascular Disease

## 2014-06-28 ENCOUNTER — Ambulatory Visit (INDEPENDENT_AMBULATORY_CARE_PROVIDER_SITE_OTHER): Payer: Medicare Other | Admitting: Cardiovascular Disease

## 2014-06-28 VITALS — BP 110/68 | HR 112 | Ht 70.0 in | Wt 180.0 lb

## 2014-06-28 DIAGNOSIS — R531 Weakness: Secondary | ICD-10-CM

## 2014-06-28 DIAGNOSIS — I2510 Atherosclerotic heart disease of native coronary artery without angina pectoris: Secondary | ICD-10-CM

## 2014-06-28 DIAGNOSIS — I10 Essential (primary) hypertension: Secondary | ICD-10-CM

## 2014-06-28 DIAGNOSIS — E860 Dehydration: Secondary | ICD-10-CM

## 2014-06-28 DIAGNOSIS — E785 Hyperlipidemia, unspecified: Secondary | ICD-10-CM

## 2014-06-28 DIAGNOSIS — I25708 Atherosclerosis of coronary artery bypass graft(s), unspecified, with other forms of angina pectoris: Secondary | ICD-10-CM

## 2014-06-28 NOTE — Progress Notes (Signed)
Patient ID: Jeffrey [name], male   DOB: [birth year], 80 y.o.   MRN: [id number]      SUBJECTIVE: Pt returns for follow up after feeling weak at last office visit last week. I had checked a UA, CBC, and BMET, all of which were unremarkable. He is feeling better after increasing his water consumption. His SBP is sometimes as high as 150 mmHg in the morning but comes down to the 130 mmHg range after taking Coreg.   Review of Systems: As per "subjective", otherwise negative.  No Known Allergies  Current Outpatient Prescriptions  Medication Sig Dispense Refill  . acetaminophen (TYLENOL) 325 MG tablet Take 2 tablets (650 mg total) by mouth every 4 (four) hours as needed for headache or mild pain.    Marland Kitchen albuterol (PROVENTIL HFA;VENTOLIN HFA) 108 (90 BASE) MCG/ACT inhaler Inhale 2 puffs into the lungs every 6 (six) hours as needed for wheezing or shortness of breath.    Marland Kitchen aspirin EC 81 MG tablet Take 81 mg by mouth daily.    Marland Kitchen atorvastatin (LIPITOR) 40 MG tablet Take 1 tablet (40 mg total) by mouth daily at 6 PM. 30 tablet 11  . bismuth subsalicylate (PEPTO BISMOL) 262 MG/15ML suspension Take 30 mLs by mouth every 6 (six) hours as needed for indigestion or diarrhea or loose stools.     . carvedilol (COREG) 3.125 MG tablet Take 3.125 mg by mouth 2 (two) times daily with a meal.    . furosemide (LASIX) 20 MG tablet Take 20 mg by mouth daily as needed for fluid. Take 1 tablet as needed if 3 lb weight gain.    . isosorbide mononitrate (IMDUR) 30 MG 24 hr tablet Take 1 tablet (30 mg total) by mouth daily. 30 tablet 0  . metFORMIN (GLUCOPHAGE) 500 MG tablet Take 2 tablets (1,000 mg total) by mouth 2 (two) times daily with a meal.    . Multiple Vitamin (MULTIVITAMIN WITH MINERALS) TABS Take 1 tablet by mouth daily.    . nitroGLYCERIN (NITROSTAT) 0.4 MG SL tablet Place 0.4 mg under the tongue every 5 (five) minutes as needed for chest pain.    . pantoprazole (PROTONIX) 40 MG tablet Take 40 mg by mouth daily.     . potassium chloride (K-DUR) 10 MEQ tablet Take 10 mEq by mouth daily. Every other day    . simethicone (MYLICON) 80 MG chewable tablet Chew 80 mg by mouth every 6 (six) hours as needed for flatulence.    . tamsulosin (FLOMAX) 0.4 MG CAPS Take 0.4 mg by mouth daily after supper.     . ticagrelor (BRILINTA) 90 MG TABS tablet Take 1 tablet (90 mg total) by mouth 2 (two) times daily. 60 tablet 6   No current facility-administered medications for this visit.    Past Medical History  Diagnosis Date  . Diabetes mellitus, type II   . Hyperlipidemia   . Coronary atherosclerosis of native coronary artery     a. CABG x 4 in 1989 (VG->OM1->OM2, VG->RCA, LIMA->LAD), b. 05/2010: DES to VG-OM1/OM2, DES to distal LCx. c. NSTEMI in 04/2011 - TO distal LCX stent and VG->OM2. d. 02/2012 NSTEMI DES to VG-OM1/continuation to OM2 occluded. e. inferior STEMI s/p DES to SVG-RAMUS 06/2012. f. inferolat STEMI 09/2012 s/p DES to SVG-interm; g. Lex MV (11/14):  EF 35%, inf-lat scar with small peri-infarct ischemia  . Essential hypertension, benign   . Osteoarthritis   . History of stroke   . History of pneumonia   . Cervical vertebral  fracture   . Chronic back pain   . Benign prostatic hypertrophy     History of urinary retention  . Peptic ulcer disease   . Gastroesophageal reflux disease   . Ischemic cardiomyopathy Nov 2015    EF 35% cath, 45-50% by echo    Past Surgical History  Procedure Laterality Date  . Tonsillectomy    . Coronary artery bypass graft  1989  . Coronary angioplasty  10/12, 8/13, 12/13, 3/14    SVG-OM PCI  . Cardiac catheterization  05/19/14    SVG-OM occl- medical Rx  . Left heart catheterization with coronary angiogram N/A 11/01/2011    Procedure: LEFT HEART CATHETERIZATION WITH CORONARY ANGIOGRAM;  Surgeon: Lorretta Harp, MD;  Location: [facility] CATH LAB;  Service: Cardiovascular;  Laterality: N/A;  . Percutaneous coronary stent intervention (pci-s) N/A 11/01/2011    Procedure:  PERCUTANEOUS CORONARY STENT INTERVENTION (PCI-S);  Surgeon: Lorretta Harp, MD;  Location: [facility] CATH LAB;  Service: Cardiovascular;  Laterality: N/A;  . Left heart catheterization with coronary/graft angiogram N/A 03/10/2012    Procedure: LEFT HEART CATHETERIZATION WITH [name];  Surgeon: Sherren Mocha, MD;  Location: [facility] CATH LAB;  Service: Cardiovascular;  Laterality: N/A;  . Left heart catheterization with coronary angiogram N/A 06/18/2012    Procedure: LEFT HEART CATHETERIZATION WITH CORONARY ANGIOGRAM;  Surgeon: Peter M Martinique, MD;  Location: [facility] CATH LAB;  Service: Cardiovascular;  Laterality: N/A;  . Percutaneous coronary stent intervention (pci-s)  06/18/2012    Procedure: PERCUTANEOUS CORONARY STENT INTERVENTION (PCI-S);  Surgeon: Peter M Martinique, MD;  Location: [facility] CATH LAB;  Service: Cardiovascular;;  . Left heart catheterization with coronary/graft angiogram  10/06/2012    Procedure: LEFT HEART CATHETERIZATION WITH [name];  Surgeon: Burnell Blanks, MD;  Location: [facility] CATH LAB;  Service: Cardiovascular;;  . Percutaneous coronary stent intervention (pci-s)  10/06/2012    Procedure: PERCUTANEOUS CORONARY STENT INTERVENTION (PCI-S);  Surgeon: Burnell Blanks, MD;  Location: [facility] CATH LAB;  Service: Cardiovascular;;  . Left heart catheterization with coronary/graft angiogram N/A 11/09/2012    Procedure: LEFT HEART CATHETERIZATION WITH [name];  Surgeon: Peter M Martinique, MD;  Location: [facility] CATH LAB;  Service: Cardiovascular;  Laterality: N/A;  . Left heart catheterization with coronary/graft angiogram N/A 05/19/2014    Procedure: LEFT HEART CATHETERIZATION WITH [name];  Surgeon: Troy Sine, MD;  Location: [facility] CATH LAB;  Service: Cardiovascular;  Laterality: N/A;    History   Social History  . Marital Status: Divorced    Spouse Name: N/A    Number of Children: N/A  . Years of Education: N/A   Occupational History    . Retired     Designer, television/film set   Social History Main Topics  . Smoking status: Former Smoker -- 2.00 packs/day for 10 years    Types: Cigarettes    Start date: 07/14/1950    Quit date: 07/14/1961  . Smokeless tobacco: Current User    Types: Chew  . Alcohol Use: No  . Drug Use: Not on file  . Sexual Activity: No   Other Topics Concern  . Not on file   Social History Narrative   ** Merged History Encounter **...   He was raised by his uncle.  His mother deceased when   he was 58 year old with appendicitis. Father deceased from an MI at age   6.  He has 1 sister, but he does not know her health status as they   have been separated.  Filed Vitals:   06/28/14 1442  BP: 110/68  Pulse: 112  Height: 5\' 10"  (1.778 m)  Weight: 180 lb (81.647 kg)   HR by auscultation: 80-86 bpm  PHYSICAL EXAM General: NAD HEENT: Normal. Neck: No JVD, no thyromegaly. Lungs: Clear to auscultation bilaterally with normal respiratory effort. CV: Nondisplaced PMI.  Regular rate and rhythm, normal S1/S2, no S3/S4, no murmur. No pretibial or periankle edema.  No carotid bruit.  Normal pedal pulses.  Abdomen: Soft, nontender, no hepatosplenomegaly, no distention.  Neurologic: Alert and oriented x 3.  Psych: Normal affect. Skin: Normal. Musculoskeletal: Normal range of motion, no gross deformities. Extremities: No clubbing or cyanosis.   ECG: Most recent ECG reviewed.      ASSESSMENT AND PLAN: 1. CAD: Stable ischemic heart disease. Continue current therapy with ASA, statin, low-dose Coreg, Imdur, and Brilinta (given multiple coronary interventions). 2. Essential HTN: Controlled with Coreg. No changes. 3. Hyperlipidemia: Continue Lipitor 40 mg daily. 4. Weakness: Resolved, and likely secondary to dehydration.  Dispo: f/u 3-4 months.   [name], M.D., F.A.C.C.

## 2014-06-28 NOTE — Patient Instructions (Signed)
Your physician recommends that you schedule a follow-up appointment in: 3 months with Dr. Koneswaran  Your physician recommends that you continue on your current medications as directed. Please refer to the Current Medication list given to you today.  Thank you for choosing [facility]!    

## 2014-07-11 ENCOUNTER — Other Ambulatory Visit: Payer: Self-pay | Admitting: Cardiovascular Disease

## 2014-07-18 ENCOUNTER — Other Ambulatory Visit: Payer: Self-pay | Admitting: Adult Health

## 2014-08-07 ENCOUNTER — Emergency Department (HOSPITAL_COMMUNITY): Payer: Medicare Other

## 2014-08-07 ENCOUNTER — Emergency Department (HOSPITAL_COMMUNITY)
Admission: EM | Admit: 2014-08-07 | Discharge: 2014-08-07 | Disposition: A | Discharge status: Home / Self Care | Payer: Medicare Other | Attending: Emergency Medicine | Admitting: Emergency Medicine

## 2014-08-07 ENCOUNTER — Encounter (HOSPITAL_COMMUNITY): Payer: Self-pay | Admitting: Emergency Medicine

## 2014-08-07 DIAGNOSIS — Z9889 Other specified postprocedural states: Secondary | ICD-10-CM | POA: Insufficient documentation

## 2014-08-07 DIAGNOSIS — Y9389 Activity, other specified: Secondary | ICD-10-CM | POA: Diagnosis not present

## 2014-08-07 DIAGNOSIS — Z79899 Other long term (current) drug therapy: Secondary | ICD-10-CM | POA: Insufficient documentation

## 2014-08-07 DIAGNOSIS — Y9289 Other specified places as the place of occurrence of the external cause: Secondary | ICD-10-CM | POA: Insufficient documentation

## 2014-08-07 DIAGNOSIS — I10 Essential (primary) hypertension: Secondary | ICD-10-CM | POA: Diagnosis not present

## 2014-08-07 DIAGNOSIS — M1990 Unspecified osteoarthritis, unspecified site: Secondary | ICD-10-CM | POA: Diagnosis not present

## 2014-08-07 DIAGNOSIS — K219 Gastro-esophageal reflux disease without esophagitis: Secondary | ICD-10-CM | POA: Insufficient documentation

## 2014-08-07 DIAGNOSIS — Z951 Presence of aortocoronary bypass graft: Secondary | ICD-10-CM | POA: Insufficient documentation

## 2014-08-07 DIAGNOSIS — Y998 Other external cause status: Secondary | ICD-10-CM | POA: Insufficient documentation

## 2014-08-07 DIAGNOSIS — Z8781 Personal history of (healed) traumatic fracture: Secondary | ICD-10-CM | POA: Insufficient documentation

## 2014-08-07 DIAGNOSIS — Z7982 Long term (current) use of aspirin: Secondary | ICD-10-CM | POA: Insufficient documentation

## 2014-08-07 DIAGNOSIS — I2510 Atherosclerotic heart disease of native coronary artery without angina pectoris: Secondary | ICD-10-CM | POA: Insufficient documentation

## 2014-08-07 DIAGNOSIS — Z87448 Personal history of other diseases of urinary system: Secondary | ICD-10-CM | POA: Insufficient documentation

## 2014-08-07 DIAGNOSIS — S20212A Contusion of left front wall of thorax, initial encounter: Secondary | ICD-10-CM | POA: Insufficient documentation

## 2014-08-07 DIAGNOSIS — E785 Hyperlipidemia, unspecified: Secondary | ICD-10-CM | POA: Diagnosis not present

## 2014-08-07 DIAGNOSIS — S299XXA Unspecified injury of thorax, initial encounter: Secondary | ICD-10-CM | POA: Diagnosis present

## 2014-08-07 DIAGNOSIS — E119 Type 2 diabetes mellitus without complications: Secondary | ICD-10-CM | POA: Insufficient documentation

## 2014-08-07 DIAGNOSIS — Z87891 Personal history of nicotine dependence: Secondary | ICD-10-CM | POA: Diagnosis not present

## 2014-08-07 DIAGNOSIS — W1839XA Other fall on same level, initial encounter: Secondary | ICD-10-CM | POA: Insufficient documentation

## 2014-08-07 DIAGNOSIS — W19XXXA Unspecified fall, initial encounter: Secondary | ICD-10-CM

## 2014-08-07 NOTE — ED Notes (Signed)
Pt he fell at approximately 2300 last night. Pt c/o left posterior rib pain-worse with movement and palpation.

## 2014-08-07 NOTE — Discharge Instructions (Signed)
Use Tylenol, or aspirin, as needed for pain.   Chest Contusion A chest contusion is a deep bruise on your chest area. Contusions are the result of an injury that caused bleeding under the skin. A chest contusion may involve bruising of the skin, muscles, or ribs. The contusion may turn blue, purple, or yellow. Minor injuries will give you a painless contusion, but more severe contusions may stay painful and swollen for a few weeks. CAUSES  A contusion is usually caused by a blow, trauma, or direct force to an area of the body. SYMPTOMS   Swelling and redness of the injured area.  Discoloration of the injured area.  Tenderness and soreness of the injured area.  Pain. DIAGNOSIS  The diagnosis can be made by taking a history and performing a physical exam. An X-ray, CT scan, or MRI may be needed to determine if there were any associated injuries, such as broken bones (fractures) or internal injuries. TREATMENT  Often, the best treatment for a chest contusion is resting, icing, and applying cold compresses to the injured area. Deep breathing exercises may be recommended to reduce the risk of pneumonia. Over-the-counter medicines may also be recommended for pain control. HOME CARE INSTRUCTIONS   Put ice on the injured area.  Put ice in a plastic bag.  Place a towel between your skin and the bag.  Leave the ice on for 15-20 minutes, 03-04 times a day.  Only take over-the-counter or prescription medicines as directed by your caregiver. Your caregiver may recommend avoiding anti-inflammatory medicines (aspirin, ibuprofen, and naproxen) for 48 hours because these medicines may increase bruising.  Rest the injured area.  Perform deep-breathing exercises as directed by your caregiver.  Stop smoking if you smoke.  Do not lift objects over 5 pounds (2.3 kg) for 3 days or longer if recommended by your caregiver. SEEK IMMEDIATE MEDICAL CARE IF:   You have increased bruising or  swelling.  You have pain that is getting worse.  You have difficulty breathing.  You have dizziness, weakness, or fainting.  You have blood in your urine or stool.  You cough up or vomit blood.  Your swelling or pain is not relieved with medicines. MAKE SURE YOU:   Understand these instructions.  Will watch your condition.  Will get help right away if you are not doing well or get worse. Document Released: 03/25/2001 Document Revised: 03/24/2012 Document Reviewed: 12/22/2011 [facility] Patient Information 2015 [place], [place]. This information is not intended to replace advice given to you by your health care provider. Make sure you discuss any questions you have with your health care provider.

## 2014-08-07 NOTE — ED Provider Notes (Signed)
CSN: [id number]     Arrival date & time 08/07/14  8756 History  This chart was scribed for Richarda Blade, MD by Edison Simon, ED Scribe. This patient was seen in room APA06/APA06 and the patient's care was started at 8:39 AM.   Chief Complaint  Patient presents with  . Rib Injury   The history is provided by the patient. No language interpreter was used.    HPI Comments: Jeffrey [name] is a 81 y.o. male who presents to the Emergency Department complaining of left rib pain after falling onto it while sleeping at 2300 last night. He is unsure how exactly he fell. He states he then took 3 ASA and went back to sleep. He states he lives by himself. He states he ate som cereal this morning and it went down okay. He notes history of cardiac problems and when pain seemed to affect that area, decided to come in for evaluation.   Past Medical History  Diagnosis Date  . Diabetes mellitus, type II   . Hyperlipidemia   . Coronary atherosclerosis of native coronary artery     a. CABG x 4 in 1989 (VG->OM1->OM2, VG->RCA, LIMA->LAD), b. 05/2010: DES to VG-OM1/OM2, DES to distal LCx. c. NSTEMI in 04/2011 - TO distal LCX stent and VG->OM2. d. 02/2012 NSTEMI DES to VG-OM1/continuation to OM2 occluded. e. inferior STEMI s/p DES to SVG-RAMUS 06/2012. f. inferolat STEMI 09/2012 s/p DES to SVG-interm; g. Lex MV (11/14):  EF 35%, inf-lat scar with small peri-infarct ischemia  . Essential hypertension, benign   . Osteoarthritis   . History of stroke   . History of pneumonia   . Cervical vertebral fracture   . Chronic back pain   . Benign prostatic hypertrophy     History of urinary retention  . Peptic ulcer disease   . Gastroesophageal reflux disease   . Ischemic cardiomyopathy Nov 2015    EF 35% cath, 45-50% by echo   Past Surgical History  Procedure Laterality Date  . Tonsillectomy    . Coronary artery bypass graft  1989  . Coronary angioplasty  10/12, 8/13, 12/13, 3/14    SVG-OM PCI  . Cardiac  catheterization  05/19/14    SVG-OM occl- medical Rx  . Left heart catheterization with coronary angiogram N/A 11/01/2011    Procedure: LEFT HEART CATHETERIZATION WITH CORONARY ANGIOGRAM;  Surgeon: Lorretta Harp, MD;  Location: [facility] CATH LAB;  Service: Cardiovascular;  Laterality: N/A;  . Percutaneous coronary stent intervention (pci-s) N/A 11/01/2011    Procedure: PERCUTANEOUS CORONARY STENT INTERVENTION (PCI-S);  Surgeon: Lorretta Harp, MD;  Location: [facility] CATH LAB;  Service: Cardiovascular;  Laterality: N/A;  . Left heart catheterization with coronary/graft angiogram N/A 03/10/2012    Procedure: LEFT HEART CATHETERIZATION WITH [name];  Surgeon: Sherren Mocha, MD;  Location: [facility] CATH LAB;  Service: Cardiovascular;  Laterality: N/A;  . Left heart catheterization with coronary angiogram N/A 06/18/2012    Procedure: LEFT HEART CATHETERIZATION WITH CORONARY ANGIOGRAM;  Surgeon: Peter M Martinique, MD;  Location: [facility] CATH LAB;  Service: Cardiovascular;  Laterality: N/A;  . Percutaneous coronary stent intervention (pci-s)  06/18/2012    Procedure: PERCUTANEOUS CORONARY STENT INTERVENTION (PCI-S);  Surgeon: Peter M Martinique, MD;  Location: [facility] CATH LAB;  Service: Cardiovascular;;  . Left heart catheterization with coronary/graft angiogram  10/06/2012    Procedure: LEFT HEART CATHETERIZATION WITH [name];  Surgeon: Burnell Blanks, MD;  Location: [facility] CATH LAB;  Service: Cardiovascular;;  . Percutaneous coronary  stent intervention (pci-s)  10/06/2012    Procedure: PERCUTANEOUS CORONARY STENT INTERVENTION (PCI-S);  Surgeon: Burnell Blanks, MD;  Location: [facility] CATH LAB;  Service: Cardiovascular;;  . Left heart catheterization with coronary/graft angiogram N/A 11/09/2012    Procedure: LEFT HEART CATHETERIZATION WITH [name];  Surgeon: Peter M Martinique, MD;  Location: [facility] CATH LAB;  Service: Cardiovascular;  Laterality: N/A;  . Left heart catheterization with  coronary/graft angiogram N/A 05/19/2014    Procedure: LEFT HEART CATHETERIZATION WITH [name];  Surgeon: Troy Sine, MD;  Location: [facility] CATH LAB;  Service: Cardiovascular;  Laterality: N/A;   Family History  Problem Relation Age of Onset  . Early death      Parents died young  . Appendicitis Mother     Pt was 40 year old  . Heart attack Father 80   History  Substance Use Topics  . Smoking status: Former Smoker -- 2.00 packs/day for 10 years    Types: Cigarettes    Start date: 07/14/1950    Quit date: 07/14/1961  . Smokeless tobacco: Current User    Types: Chew  . Alcohol Use: No    Review of Systems  Musculoskeletal:       Rib pain  All other systems reviewed and are negative.     Allergies  Review of patient's allergies indicates no known allergies.  Home Medications   Prior to Admission medications   Medication Sig Start Date End Date Taking? Authorizing Provider  acetaminophen (TYLENOL) 325 MG tablet Take 2 tablets (650 mg total) by mouth every 4 (four) hours as needed for headache or mild pain. 05/21/14   Erlene Quan, PA-C  albuterol (PROVENTIL HFA;VENTOLIN HFA) 108 (90 BASE) MCG/ACT inhaler Inhale 2 puffs into the lungs every 6 (six) hours as needed for wheezing or shortness of breath.    Historical Provider, MD  aspirin EC 81 MG tablet Take 81 mg by mouth daily.    Historical Provider, MD  atorvastatin (LIPITOR) 40 MG tablet Take 1 tablet (40 mg total) by mouth daily at 6 PM. 05/29/13   Liliane Shi, PA-C  bismuth subsalicylate (PEPTO BISMOL) 262 MG/15ML suspension Take 30 mLs by mouth every 6 (six) hours as needed for indigestion or diarrhea or loose stools.     Historical Provider, MD  BRILINTA 90 MG TABS tablet TAKE 1 TABLET BY MOUTH TWICE DAILY. 07/18/14   Herminio Commons, MD  carvedilol (COREG) 3.125 MG tablet Take 3.125 mg by mouth 2 (two) times daily with a meal.    Historical Provider, MD  furosemide (LASIX) 20 MG tablet Take 20 mg by  mouth daily as needed for fluid. Take 1 tablet as needed if 3 lb weight gain. 05/06/13   Lendon Colonel, NP  isosorbide mononitrate (IMDUR) 30 MG 24 hr tablet TAKE 1 TABLET BY MOUTH ONCE DAILY. 07/11/14   Herminio Commons, MD  metFORMIN (GLUCOPHAGE) 500 MG tablet Take 2 tablets (1,000 mg total) by mouth 2 (two) times daily with a meal. 05/22/14   Erlene Quan, PA-C  Multiple Vitamin (MULTIVITAMIN WITH MINERALS) TABS Take 1 tablet by mouth daily.    Historical Provider, MD  nitroGLYCERIN (NITROSTAT) 0.4 MG SL tablet Place 0.4 mg under the tongue every 5 (five) minutes as needed for chest pain.    Historical Provider, MD  pantoprazole (PROTONIX) 40 MG tablet Take 40 mg by mouth daily.    Historical Provider, MD  potassium chloride (K-DUR) 10 MEQ tablet Take 10 mEq  by mouth daily. Every other day    Historical Provider, MD  simethicone (MYLICON) 80 MG chewable tablet Chew 80 mg by mouth every 6 (six) hours as needed for flatulence.    Historical Provider, MD  tamsulosin (FLOMAX) 0.4 MG CAPS Take 0.4 mg by mouth daily after supper.  01/27/13   Historical Provider, MD   BP 117/96 mmHg  Pulse 80  Temp(Src) 97.8 F (36.6 C)  Resp 18  Ht 5\' 11"  (1.803 m)  Wt 180 lb (81.647 kg)  BMI 25.12 kg/m2  SpO2 100% Physical Exam  Constitutional: He is oriented to person, place, and time. He appears well-developed and well-nourished.  HENT:  Head: Normocephalic and atraumatic.  Right Ear: External ear normal.  Left Ear: External ear normal.  Eyes: Conjunctivae and EOM are normal. Pupils are equal, round, and reactive to light.  Neck: Normal range of motion and phonation normal. Neck supple.  Cardiovascular: Normal rate, regular rhythm and normal heart sounds.   Pulmonary/Chest: Effort normal and breath sounds normal. He exhibits tenderness (Diffuse left chest wall pain without crepitance or deformity). He exhibits no bony tenderness.  Abdominal: Soft. There is no tenderness.  Musculoskeletal: Normal  range of motion.  Neurological: He is alert and oriented to person, place, and time. No cranial nerve deficit or sensory deficit. He exhibits normal muscle tone. Coordination normal.  Skin: Skin is warm, dry and intact.  Psychiatric: He has a normal mood and affect. His behavior is normal. Judgment and thought content normal.  Nursing note and vitals reviewed.   ED Course  Procedures (including critical care time)  DIAGNOSTIC STUDIES: Oxygen Saturation is 99% on room air, normal by my interpretation.    COORDINATION OF CARE: 8:41 AM Discussed with patient that his x-rays show no acute findings treatment plan discharge and treat pain with ASA or Tylenol. The patient agrees with the plan and has no further questions at this time.   Labs Review Labs Reviewed - No data to display  Imaging Review Dg Ribs Unilateral W/chest Left  08/07/2014   CLINICAL DATA:  Initial encounter for lower rib pain after falling while sleep walking last night  EXAM: LEFT RIBS AND CHEST - 3+ VIEW  COMPARISON:  06/01/2014  FINDINGS: No fracture or other bone lesions are seen involving the ribs. There is no evidence of pneumothorax or pleural effusion. Both lungs are clear. Heart size and mediastinal contours are within normal limits.  There is prior sternotomy and CABG.  IMPRESSION: No acute findings   Electronically Signed   By: Andreas Newport M.D.   On: 08/07/2014 08:28     EKG Interpretation None      MDM   Final diagnoses:  Contusion, chest wall, left, initial encounter    Chest wall contusion without evidence for rib fracture.  Doubt spine injury.  No indication for further evaluation, at this time.  Nursing Notes Reviewed/ Care Coordinated Applicable Imaging Reviewed Interpretation of Laboratory Data incorporated into ED treatment  The patient appears reasonably screened and/or stabilized for discharge and I doubt any other medical condition or other [facility] requiring further screening, evaluation,  or treatment in the ED at this time prior to discharge.  Plan: Home Medications- usual; Home Treatments- rest; return here if the recommended treatment, does not improve the symptoms; Recommended follow up- PCP prn    I personally performed the services described in this documentation, which was scribed in my presence. The recorded information has been reviewed and is accurate.  Richarda Blade, MD 08/07/14 [phone]

## 2014-08-09 ENCOUNTER — Encounter (HOSPITAL_COMMUNITY): Payer: Self-pay

## 2014-08-09 ENCOUNTER — Emergency Department (HOSPITAL_COMMUNITY)
Admission: EM | Admit: 2014-08-09 | Discharge: 2014-08-09 | Disposition: A | Discharge status: Home / Self Care | Payer: Medicare Other | Attending: Emergency Medicine | Admitting: Emergency Medicine

## 2014-08-09 DIAGNOSIS — S20212D Contusion of left front wall of thorax, subsequent encounter: Secondary | ICD-10-CM

## 2014-08-09 DIAGNOSIS — M1990 Unspecified osteoarthritis, unspecified site: Secondary | ICD-10-CM | POA: Diagnosis not present

## 2014-08-09 DIAGNOSIS — W01198D Fall on same level from slipping, tripping and stumbling with subsequent striking against other object, subsequent encounter: Secondary | ICD-10-CM | POA: Insufficient documentation

## 2014-08-09 DIAGNOSIS — M549 Dorsalgia, unspecified: Secondary | ICD-10-CM | POA: Insufficient documentation

## 2014-08-09 DIAGNOSIS — I10 Essential (primary) hypertension: Secondary | ICD-10-CM | POA: Insufficient documentation

## 2014-08-09 DIAGNOSIS — E785 Hyperlipidemia, unspecified: Secondary | ICD-10-CM | POA: Insufficient documentation

## 2014-08-09 DIAGNOSIS — E119 Type 2 diabetes mellitus without complications: Secondary | ICD-10-CM | POA: Insufficient documentation

## 2014-08-09 DIAGNOSIS — I2510 Atherosclerotic heart disease of native coronary artery without angina pectoris: Secondary | ICD-10-CM | POA: Diagnosis not present

## 2014-08-09 DIAGNOSIS — K219 Gastro-esophageal reflux disease without esophagitis: Secondary | ICD-10-CM | POA: Diagnosis not present

## 2014-08-09 DIAGNOSIS — I255 Ischemic cardiomyopathy: Secondary | ICD-10-CM | POA: Insufficient documentation

## 2014-08-09 DIAGNOSIS — Z8701 Personal history of pneumonia (recurrent): Secondary | ICD-10-CM | POA: Diagnosis not present

## 2014-08-09 DIAGNOSIS — S20219D Contusion of unspecified front wall of thorax, subsequent encounter: Secondary | ICD-10-CM | POA: Diagnosis not present

## 2014-08-09 DIAGNOSIS — K279 Peptic ulcer, site unspecified, unspecified as acute or chronic, without hemorrhage or perforation: Secondary | ICD-10-CM | POA: Diagnosis not present

## 2014-08-09 DIAGNOSIS — N400 Enlarged prostate without lower urinary tract symptoms: Secondary | ICD-10-CM | POA: Insufficient documentation

## 2014-08-09 DIAGNOSIS — F17220 Nicotine dependence, chewing tobacco, uncomplicated: Secondary | ICD-10-CM | POA: Diagnosis not present

## 2014-08-09 DIAGNOSIS — Z8673 Personal history of transient ischemic attack (TIA), and cerebral infarction without residual deficits: Secondary | ICD-10-CM | POA: Diagnosis not present

## 2014-08-09 DIAGNOSIS — G8929 Other chronic pain: Secondary | ICD-10-CM | POA: Diagnosis not present

## 2014-08-09 DIAGNOSIS — R079 Chest pain, unspecified: Secondary | ICD-10-CM | POA: Diagnosis present

## 2014-08-09 MED ORDER — OXYCODONE-ACETAMINOPHEN 5-325 MG PO TABS: 1.0000 | ORAL_TABLET | ORAL | Status: DC | PRN

## 2014-08-09 MED ORDER — OXYCODONE-ACETAMINOPHEN 5-325 MG PO TABS
1.0000 | ORAL_TABLET | ORAL | Status: DC | PRN
Start: 2014-08-09 — End: 2014-09-21

## 2014-08-09 NOTE — ED Notes (Signed)
Patient on cardiac monitor and EKG done and given to [facility].

## 2014-08-09 NOTE — Discharge Instructions (Signed)
Chest Contusion A chest contusion is a deep bruise on your chest area. Contusions are the result of an injury that caused bleeding under the skin. A chest contusion may involve bruising of the skin, muscles, or ribs. The contusion may turn blue, purple, or yellow. Minor injuries will give you a painless contusion, but more severe contusions may stay painful and swollen for a few weeks. CAUSES  A contusion is usually caused by a blow, trauma, or direct force to an area of the body. SYMPTOMS   Swelling and redness of the injured area.  Discoloration of the injured area.  Tenderness and soreness of the injured area.  Pain. DIAGNOSIS  The diagnosis can be made by taking a history and performing a physical exam. An X-ray, CT scan, or MRI may be needed to determine if there were any associated injuries, such as broken bones (fractures) or internal injuries. TREATMENT  Often, the best treatment for a chest contusion is resting, icing, and applying cold compresses to the injured area. Deep breathing exercises may be recommended to reduce the risk of pneumonia. Over-the-counter medicines may also be recommended for pain control. HOME CARE INSTRUCTIONS   Put ice on the injured area.  Put ice in a plastic bag.  Place a towel between your skin and the bag.  Leave the ice on for 15-20 minutes, 03-04 times a day.  Only take over-the-counter or prescription medicines as directed by your caregiver. Your caregiver may recommend avoiding anti-inflammatory medicines (aspirin, ibuprofen, and naproxen) for 48 hours because these medicines may increase bruising.  Rest the injured area.  Perform deep-breathing exercises as directed by your caregiver.  Stop smoking if you smoke.  Do not lift objects over 5 pounds (2.3 kg) for 3 days or longer if recommended by your caregiver. SEEK IMMEDIATE MEDICAL CARE IF:   You have increased bruising or swelling.  You have pain that is getting worse.  You have  difficulty breathing.  You have dizziness, weakness, or fainting.  You have blood in your urine or stool.  You cough up or vomit blood.  Your swelling or pain is not relieved with medicines. MAKE SURE YOU:   Understand these instructions.  Will watch your condition.  Will get help right away if you are not doing well or get worse. Document Released: 03/25/2001 Document Revised: 03/24/2012 Document Reviewed: 12/22/2011 [facility] Patient Information 2015 [place], [place]. This information is not intended to replace advice given to you by your health care provider. Make sure you discuss any questions you have with your health care provider.  Acetaminophen; Oxycodone tablets What is this medicine? ACETAMINOPHEN; OXYCODONE (a set a MEE noe fen; ox i KOE done) is a pain reliever. It is used to treat mild to moderate pain. This medicine may be used for other purposes; ask your health care provider or pharmacist if you have questions. COMMON BRAND NAME(S): Endocet, Magnacet, Narvox, Percocet, Perloxx, Primalev, Primlev, Roxicet, Xolox What should I tell my health care provider before I take this medicine? They need to know if you have any of these conditions: -brain tumor -Crohn's disease, inflammatory bowel disease, or ulcerative colitis -drug abuse or addiction -head injury -heart or circulation problems -if you often drink alcohol -kidney disease or problems going to the bathroom -liver disease -lung disease, asthma, or breathing problems -an unusual or allergic reaction to acetaminophen, oxycodone, other opioid analgesics, other medicines, foods, dyes, or preservatives -pregnant or trying to get pregnant -breast-feeding How should I use this medicine? Take  this medicine by mouth with a full glass of water. Follow the directions on the prescription label. Take your medicine at regular intervals. Do not take your medicine more often than directed. Talk to your pediatrician regarding  the use of this medicine in children. Special care may be needed. Patients over 54 years old may have a stronger reaction and need a smaller dose. Overdosage: If you think you have taken too much of this medicine contact a poison control center or emergency room at once. NOTE: This medicine is only for you. Do not share this medicine with others. What if I miss a dose? If you miss a dose, take it as soon as you can. If it is almost time for your next dose, take only that dose. Do not take double or extra doses. What may interact with this medicine? -alcohol -antihistamines -barbiturates like amobarbital, butalbital, butabarbital, methohexital, pentobarbital, phenobarbital, thiopental, and secobarbital -benztropine -drugs for bladder problems like solifenacin, trospium, oxybutynin, tolterodine, hyoscyamine, and methscopolamine -drugs for breathing problems like ipratropium and tiotropium -drugs for certain stomach or intestine problems like propantheline, homatropine methylbromide, glycopyrrolate, atropine, belladonna, and dicyclomine -general anesthetics like etomidate, ketamine, nitrous oxide, propofol, desflurane, enflurane, halothane, isoflurane, and sevoflurane -medicines for depression, anxiety, or psychotic disturbances -medicines for sleep -muscle relaxants -naltrexone -narcotic medicines (opiates) for pain -phenothiazines like perphenazine, thioridazine, chlorpromazine, mesoridazine, fluphenazine, prochlorperazine, promazine, and trifluoperazine -scopolamine -tramadol -trihexyphenidyl This list may not describe all possible interactions. Give your health care provider a list of all the medicines, herbs, non-prescription drugs, or dietary supplements you use. Also tell them if you smoke, drink alcohol, or use illegal drugs. Some items may interact with your medicine. What should I watch for while using this medicine? Tell your doctor or health care professional if your pain does not  go away, if it gets worse, or if you have new or a different type of pain. You may develop tolerance to the medicine. Tolerance means that you will need a higher dose of the medication for pain relief. Tolerance is normal and is expected if you take this medicine for a long time. Do not suddenly stop taking your medicine because you may develop a severe reaction. Your body becomes used to the medicine. This does NOT mean you are addicted. Addiction is a behavior related to getting and using a drug for a non-medical reason. If you have pain, you have a medical reason to take pain medicine. Your doctor will tell you how much medicine to take. If your doctor wants you to stop the medicine, the dose will be slowly lowered over time to avoid any side effects. You may get drowsy or dizzy. Do not drive, use machinery, or do anything that needs mental alertness until you know how this medicine affects you. Do not stand or sit up quickly, especially if you are an older patient. This reduces the risk of dizzy or fainting spells. Alcohol may interfere with the effect of this medicine. Avoid alcoholic drinks. There are different types of narcotic medicines (opiates) for pain. If you take more than one type at the same time, you may have more side effects. Give your health care provider a list of all medicines you use. Your doctor will tell you how much medicine to take. Do not take more medicine than directed. Call emergency for help if you have problems breathing. The medicine will cause constipation. Try to have a bowel movement at least every 2 to 3 days. If you do not have  a bowel movement for 3 days, call your doctor or health care professional. Do not take Tylenol (acetaminophen) or medicines that have acetaminophen with this medicine. Too much acetaminophen can be very dangerous. Many nonprescription medicines contain acetaminophen. Always read the labels carefully to avoid taking more acetaminophen. What side  effects may I notice from receiving this medicine? Side effects that you should report to your doctor or health care professional as soon as possible: -allergic reactions like skin rash, itching or hives, swelling of the face, lips, or tongue -breathing difficulties, wheezing -confusion -light headedness or fainting spells -severe stomach pain -unusually weak or tired -yellowing of the skin or the whites of the eyes Side effects that usually do not require medical attention (report to your doctor or health care professional if they continue or are bothersome): -dizziness -drowsiness -nausea -vomiting This list may not describe all possible side effects. Call your doctor for medical advice about side effects. You may report side effects to FDA at 1-800-FDA-1088. Where should I keep my medicine? Keep out of the reach of children. This medicine can be abused. Keep your medicine in a safe place to protect it from theft. Do not share this medicine with anyone. Selling or giving away this medicine is dangerous and against the law. Store at room temperature between 20 and 25 degrees C (68 and 77 degrees F). Keep container tightly closed. Protect from light. This medicine may cause accidental overdose and death if it is taken by other adults, children, or pets. Flush any unused medicine down the toilet to reduce the chance of harm. Do not use the medicine after the expiration date. NOTE: This sheet is a summary. It may not cover all possible information. If you have questions about this medicine, talk to your doctor, pharmacist, or health care provider.  2015, [employer]. (2013-02-21 13:17:35)

## 2014-08-09 NOTE — ED Notes (Signed)
Pt reports he fell 2 nights ago and fell onto a fire extinguisher that had fallen off the wall,  States he landed on it on his left chest.  Pt states he was seen here afterward and that he had x-rays done, told they were negative.  Pt states the pain is still severe.

## 2014-08-09 NOTE — ED Provider Notes (Signed)
CSN: [id number]     Arrival date & time 08/09/14  7062 History   First MD Initiated Contact with Patient 08/09/14 [phone]     Chief Complaint  Patient presents with  . Chest Pain     (Consider location/radiation/quality/duration/timing/severity/associated sxs/prior Treatment) The history is provided by the patient.   81 year old male had fallen 2 nights ago and injured his left chest wall. He came to the ED where x-rays were negative and he was sent home with instructions to take acetaminophen for pain. He's been taking acetaminophen but still has severe pain with certain movements and with deep breathing. He rates pain at 10/10 when he moves but as low as 2/10 while laying still. Acetaminophen has not been giving him sufficient relief. He denies dyspnea, nausea, diaphoresis. Pain is localized to the left lateral chest wall and left posterior chest wall.  Past Medical History  Diagnosis Date  . Diabetes mellitus, type II   . Hyperlipidemia   . Coronary atherosclerosis of native coronary artery     a. CABG x 4 in 1989 (VG->OM1->OM2, VG->RCA, LIMA->LAD), b. 05/2010: DES to VG-OM1/OM2, DES to distal LCx. c. NSTEMI in 04/2011 - TO distal LCX stent and VG->OM2. d. 02/2012 NSTEMI DES to VG-OM1/continuation to OM2 occluded. e. inferior STEMI s/p DES to SVG-RAMUS 06/2012. f. inferolat STEMI 09/2012 s/p DES to SVG-interm; g. Lex MV (11/14):  EF 35%, inf-lat scar with small peri-infarct ischemia  . Essential hypertension, benign   . Osteoarthritis   . History of stroke   . History of pneumonia   . Cervical vertebral fracture   . Chronic back pain   . Benign prostatic hypertrophy     History of urinary retention  . Peptic ulcer disease   . Gastroesophageal reflux disease   . Ischemic cardiomyopathy Nov 2015    EF 35% cath, 45-50% by echo   Past Surgical History  Procedure Laterality Date  . Tonsillectomy    . Coronary artery bypass graft  1989  . Coronary angioplasty  10/12, 8/13, 12/13, 3/14   SVG-OM PCI  . Cardiac catheterization  05/19/14    SVG-OM occl- medical Rx  . Left heart catheterization with coronary angiogram N/A 11/01/2011    Procedure: LEFT HEART CATHETERIZATION WITH CORONARY ANGIOGRAM;  Surgeon: Lorretta Harp, MD;  Location: [facility] CATH LAB;  Service: Cardiovascular;  Laterality: N/A;  . Percutaneous coronary stent intervention (pci-s) N/A 11/01/2011    Procedure: PERCUTANEOUS CORONARY STENT INTERVENTION (PCI-S);  Surgeon: Lorretta Harp, MD;  Location: [facility] CATH LAB;  Service: Cardiovascular;  Laterality: N/A;  . Left heart catheterization with coronary/graft angiogram N/A 03/10/2012    Procedure: LEFT HEART CATHETERIZATION WITH [name];  Surgeon: Sherren Mocha, MD;  Location: [facility] CATH LAB;  Service: Cardiovascular;  Laterality: N/A;  . Left heart catheterization with coronary angiogram N/A 06/18/2012    Procedure: LEFT HEART CATHETERIZATION WITH CORONARY ANGIOGRAM;  Surgeon: Peter M Martinique, MD;  Location: [facility] CATH LAB;  Service: Cardiovascular;  Laterality: N/A;  . Percutaneous coronary stent intervention (pci-s)  06/18/2012    Procedure: PERCUTANEOUS CORONARY STENT INTERVENTION (PCI-S);  Surgeon: Peter M Martinique, MD;  Location: [facility] CATH LAB;  Service: Cardiovascular;;  . Left heart catheterization with coronary/graft angiogram  10/06/2012    Procedure: LEFT HEART CATHETERIZATION WITH [name];  Surgeon: Burnell Blanks, MD;  Location: [facility] CATH LAB;  Service: Cardiovascular;;  . Percutaneous coronary stent intervention (pci-s)  10/06/2012    Procedure: PERCUTANEOUS CORONARY STENT INTERVENTION (PCI-S);  Surgeon: Annita Brod  Angelena Form, MD;  Location: [facility] CATH LAB;  Service: Cardiovascular;;  . Left heart catheterization with coronary/graft angiogram N/A 11/09/2012    Procedure: LEFT HEART CATHETERIZATION WITH [name];  Surgeon: Peter M Martinique, MD;  Location: [facility] CATH LAB;  Service: Cardiovascular;  Laterality: N/A;  . Left heart  catheterization with coronary/graft angiogram N/A 05/19/2014    Procedure: LEFT HEART CATHETERIZATION WITH [name];  Surgeon: Troy Sine, MD;  Location: [facility] CATH LAB;  Service: Cardiovascular;  Laterality: N/A;   Family History  Problem Relation Age of Onset  . Early death      Parents died young  . Appendicitis Mother     Pt was 23 year old  . Heart attack Father 4   History  Substance Use Topics  . Smoking status: Former Smoker -- 2.00 packs/day for 10 years    Types: Cigarettes    Start date: 07/14/1950    Quit date: 07/14/1961  . Smokeless tobacco: Current User    Types: Chew  . Alcohol Use: No    Review of Systems  All other systems reviewed and are negative.     Allergies  Review of patient's allergies indicates no known allergies.  Home Medications   Prior to Admission medications   Medication Sig Start Date End Date Taking? Authorizing Provider  acetaminophen (TYLENOL) 325 MG tablet Take 2 tablets (650 mg total) by mouth every 4 (four) hours as needed for headache or mild pain. 05/21/14   Erlene Quan, PA-C  albuterol (PROVENTIL HFA;VENTOLIN HFA) 108 (90 BASE) MCG/ACT inhaler Inhale 2 puffs into the lungs every 6 (six) hours as needed for wheezing or shortness of breath.    Historical Provider, MD  aspirin EC 81 MG tablet Take 81 mg by mouth daily.    Historical Provider, MD  atorvastatin (LIPITOR) 40 MG tablet Take 1 tablet (40 mg total) by mouth daily at 6 PM. 05/29/13   Liliane Shi, PA-C  bismuth subsalicylate (PEPTO BISMOL) 262 MG/15ML suspension Take 30 mLs by mouth every 6 (six) hours as needed for indigestion or diarrhea or loose stools.     Historical Provider, MD  BRILINTA 90 MG TABS tablet TAKE 1 TABLET BY MOUTH TWICE DAILY. 07/18/14   Herminio Commons, MD  carvedilol (COREG) 3.125 MG tablet Take 3.125 mg by mouth 2 (two) times daily with a meal.    Historical Provider, MD  furosemide (LASIX) 20 MG tablet Take 20 mg by mouth daily as  needed for fluid. Take 1 tablet as needed if 3 lb weight gain. 05/06/13   Lendon Colonel, NP  isosorbide mononitrate (IMDUR) 30 MG 24 hr tablet TAKE 1 TABLET BY MOUTH ONCE DAILY. 07/11/14   Herminio Commons, MD  metFORMIN (GLUCOPHAGE) 500 MG tablet Take 2 tablets (1,000 mg total) by mouth 2 (two) times daily with a meal. 05/22/14   Erlene Quan, PA-C  Multiple Vitamin (MULTIVITAMIN WITH MINERALS) TABS Take 1 tablet by mouth daily.    Historical Provider, MD  nitroGLYCERIN (NITROSTAT) 0.4 MG SL tablet Place 0.4 mg under the tongue every 5 (five) minutes as needed for chest pain.    Historical Provider, MD  pantoprazole (PROTONIX) 40 MG tablet Take 40 mg by mouth daily.    Historical Provider, MD  potassium chloride (K-DUR) 10 MEQ tablet Take 10 mEq by mouth daily. Every other day    Historical Provider, MD  simethicone (MYLICON) 80 MG chewable tablet Chew 80 mg by mouth every 6 (six) hours  as needed for flatulence.    Historical Provider, MD  tamsulosin (FLOMAX) 0.4 MG CAPS Take 0.4 mg by mouth daily after supper.  01/27/13   Historical Provider, MD   BP 125/65 mmHg  Pulse 87  Temp(Src) 97.9 F (36.6 C) (Oral)  Resp 20  Ht 5\' 11"  (1.803 m)  Wt 180 lb (81.647 kg)  BMI 25.12 kg/m2  SpO2 100% Physical Exam  Nursing note and vitals reviewed.  81 year old male, resting comfortably and in no acute distress. Vital signs are normal. Oxygen saturation is 100%, which is normal. Head is normocephalic and atraumatic. PERRLA, EOMI. Oropharynx is clear. Neck is nontender and supple without adenopathy or JVD. Back is nontender and there is no CVA tenderness. Lungs are clear without rales, wheezes, or rhonchi. Chest has an ecchymotic area in the left lateral chest wall with marked tenderness in this area. There is no crepitus. Heart has regular rate and rhythm without murmur. Abdomen is soft, flat, nontender without masses or hepatosplenomegaly and peristalsis is normoactive. Extremities have no  cyanosis or edema, full range of motion is present. Skin is warm and dry without rash. Neurologic: Mental status is normal, cranial nerves are intact, there are no motor or sensory deficits.  ED Course  Procedures (including critical care time)   Date: 08/09/2014  Rate: 84  Rhythm: normal sinus rhythm  QRS Axis: normal  Intervals: normal  ST/T Wave abnormalities: nonspecific ST/T changes  Conduction Disutrbances:none  Narrative Interpretation: Old inferior wall MI, nonspecific ST-T changes. When compared with ECG of 06/04/2014, there has been marked improvement in T wave abnormality.  Old EKG Reviewed: changes noted    MDM   Final diagnoses:  Chest wall contusion, left, subsequent encounter    Chest wall contusion. Old records are reviewed and he was seen in the ED 2 days ago and rib x-rays showed no evidence of fracture. No indication for repeat imaging. He will clearly need narcotic analgesics at home. I have discussed with him possibility of short-term stay in a nursing care facility as he lives alone. He is discharged with prescription for oxycodone-acetaminophen and is to follow-up with his PCP in the next several days.    Delora Fuel, MD 29/24/46 2863

## 2014-08-16 MED FILL — Oxycodone w/ Acetaminophen Tab 5-325 MG: ORAL | Qty: 6 | Status: AC

## 2014-09-07 ENCOUNTER — Other Ambulatory Visit: Payer: Self-pay | Admitting: Cardiovascular Disease

## 2014-09-21 ENCOUNTER — Emergency Department (HOSPITAL_COMMUNITY)
Admission: EM | Admit: 2014-09-21 | Discharge: 2014-09-21 | Disposition: A | Discharge status: Home / Self Care | Payer: Medicare Other | Attending: Emergency Medicine | Admitting: Emergency Medicine

## 2014-09-21 ENCOUNTER — Encounter (HOSPITAL_COMMUNITY): Payer: Self-pay | Admitting: Emergency Medicine

## 2014-09-21 DIAGNOSIS — I10 Essential (primary) hypertension: Secondary | ICD-10-CM | POA: Insufficient documentation

## 2014-09-21 DIAGNOSIS — Z951 Presence of aortocoronary bypass graft: Secondary | ICD-10-CM | POA: Diagnosis not present

## 2014-09-21 DIAGNOSIS — Z79899 Other long term (current) drug therapy: Secondary | ICD-10-CM | POA: Diagnosis not present

## 2014-09-21 DIAGNOSIS — R531 Weakness: Secondary | ICD-10-CM | POA: Diagnosis not present

## 2014-09-21 DIAGNOSIS — E785 Hyperlipidemia, unspecified: Secondary | ICD-10-CM | POA: Diagnosis not present

## 2014-09-21 DIAGNOSIS — Z87448 Personal history of other diseases of urinary system: Secondary | ICD-10-CM | POA: Insufficient documentation

## 2014-09-21 DIAGNOSIS — Z87891 Personal history of nicotine dependence: Secondary | ICD-10-CM | POA: Insufficient documentation

## 2014-09-21 DIAGNOSIS — E119 Type 2 diabetes mellitus without complications: Secondary | ICD-10-CM | POA: Diagnosis not present

## 2014-09-21 DIAGNOSIS — R61 Generalized hyperhidrosis: Secondary | ICD-10-CM | POA: Diagnosis not present

## 2014-09-21 DIAGNOSIS — Z9861 Coronary angioplasty status: Secondary | ICD-10-CM | POA: Diagnosis not present

## 2014-09-21 DIAGNOSIS — Z7982 Long term (current) use of aspirin: Secondary | ICD-10-CM | POA: Insufficient documentation

## 2014-09-21 DIAGNOSIS — M1990 Unspecified osteoarthritis, unspecified site: Secondary | ICD-10-CM | POA: Diagnosis not present

## 2014-09-21 DIAGNOSIS — I2510 Atherosclerotic heart disease of native coronary artery without angina pectoris: Secondary | ICD-10-CM | POA: Insufficient documentation

## 2014-09-21 DIAGNOSIS — R5381 Other malaise: Secondary | ICD-10-CM | POA: Insufficient documentation

## 2014-09-21 DIAGNOSIS — Z8711 Personal history of peptic ulcer disease: Secondary | ICD-10-CM | POA: Insufficient documentation

## 2014-09-21 DIAGNOSIS — Z8673 Personal history of transient ischemic attack (TIA), and cerebral infarction without residual deficits: Secondary | ICD-10-CM | POA: Diagnosis not present

## 2014-09-21 DIAGNOSIS — Z9889 Other specified postprocedural states: Secondary | ICD-10-CM | POA: Diagnosis not present

## 2014-09-21 DIAGNOSIS — Z8701 Personal history of pneumonia (recurrent): Secondary | ICD-10-CM | POA: Diagnosis not present

## 2014-09-21 DIAGNOSIS — K219 Gastro-esophageal reflux disease without esophagitis: Secondary | ICD-10-CM | POA: Diagnosis not present

## 2014-09-21 DIAGNOSIS — R42 Dizziness and giddiness: Secondary | ICD-10-CM | POA: Diagnosis present

## 2014-09-21 LAB — URINALYSIS, ROUTINE W REFLEX MICROSCOPIC
Bilirubin Urine: NEGATIVE
Glucose, UA: 250 mg/dL — AB
Hgb urine dipstick: NEGATIVE
Ketones, ur: NEGATIVE mg/dL
Leukocytes, UA: NEGATIVE
NITRITE: NEGATIVE
PH: 6 (ref 5.0–8.0)
Protein, ur: NEGATIVE mg/dL
Specific Gravity, Urine: 1.01 (ref 1.005–1.030)
Urobilinogen, UA: 0.2 mg/dL (ref 0.0–1.0)

## 2014-09-21 LAB — I-STAT CHEM 8, ED
BUN: 18 mg/dL (ref 6–23)
CREATININE: 0.9 mg/dL (ref 0.50–1.35)
Calcium, Ion: 1.24 mmol/L (ref 1.13–1.30)
Chloride: 101 mmol/L (ref 96–112)
Glucose, Bld: 196 mg/dL — ABNORMAL HIGH (ref 70–99)
HCT: 42 % (ref 39.0–52.0)
Hemoglobin: 14.3 g/dL (ref 13.0–17.0)
Potassium: 4.8 mmol/L (ref 3.5–5.1)
SODIUM: 138 mmol/L (ref 135–145)
TCO2: 21 mmol/L (ref 0–100)

## 2014-09-21 MED ORDER — SODIUM CHLORIDE 0.9 % IV BOLUS (SEPSIS)
500.0000 mL | Freq: Once | INTRAVENOUS | Status: AC
Start: 2014-09-21 — End: 2014-09-21
  Administered 2014-09-21: 500 mL via INTRAVENOUS

## 2014-09-21 NOTE — ED Notes (Signed)
Pt. Is unable to use the restroom at this time, but is aware that we need a urine specimen. Urinal the bedside.

## 2014-09-21 NOTE — ED Notes (Signed)
Patient given graham crackers, peanut butter, and drink. Patient eating with no difficulty.

## 2014-09-21 NOTE — ED Notes (Signed)
Patient c/o dizziness, generalized weakness, diaphoresis, and hypotension that started this morning. Patient states "I felt like this last week but I was dehydrated and was able to get my blood pressure up by drinking water. I drunk some water today but it just wouldn't come up."

## 2014-09-21 NOTE — ED Provider Notes (Signed)
CSN: [id number]     Arrival date & time 09/21/14  1307 History  This chart was scribed for No att. providers found by Edison Simon, ED Scribe. This patient was seen in room APA10/APA10 and the patient's care was started at 1:58 PM.    Chief Complaint  Patient presents with  . Dizziness   The history is provided by the patient. No language interpreter was used.    HPI Comments: Jeffrey [name] is a 81 y.o. male who presents to the Emergency Department complaining of dizziness with onset this morning. He reports associated generalized weakness, diaphoresis, and hypotension. He states he felt similar last week, but blood pressure improved after drinking water; he states drinking water today did not improve it He states his appetite has been "fair." He notes diagnosis of thyroid problems 3-4 months ago which is followed at [facility].  Past Medical History  Diagnosis Date  . Diabetes mellitus, type II   . Hyperlipidemia   . Coronary atherosclerosis of native coronary artery     a. CABG x 4 in 1989 (VG->OM1->OM2, VG->RCA, LIMA->LAD), b. 05/2010: DES to VG-OM1/OM2, DES to distal LCx. c. NSTEMI in 04/2011 - TO distal LCX stent and VG->OM2. d. 02/2012 NSTEMI DES to VG-OM1/continuation to OM2 occluded. e. inferior STEMI s/p DES to SVG-RAMUS 06/2012. f. inferolat STEMI 09/2012 s/p DES to SVG-interm; g. Lex MV (11/14):  EF 35%, inf-lat scar with small peri-infarct ischemia  . Essential hypertension, benign   . Osteoarthritis   . History of stroke   . History of pneumonia   . Cervical vertebral fracture   . Chronic back pain   . Benign prostatic hypertrophy     History of urinary retention  . Peptic ulcer disease   . Gastroesophageal reflux disease   . Ischemic cardiomyopathy Nov 2015    EF 35% cath, 45-50% by echo   Past Surgical History  Procedure Laterality Date  . Tonsillectomy    . Coronary artery bypass graft  1989  . Coronary angioplasty  10/12, 8/13, 12/13, 3/14    SVG-OM PCI  . Cardiac  catheterization  05/19/14    SVG-OM occl- medical Rx  . Left heart catheterization with coronary angiogram N/A 11/01/2011    Procedure: LEFT HEART CATHETERIZATION WITH CORONARY ANGIOGRAM;  Surgeon: Lorretta Harp, MD;  Location: [facility] CATH LAB;  Service: Cardiovascular;  Laterality: N/A;  . Percutaneous coronary stent intervention (pci-s) N/A 11/01/2011    Procedure: PERCUTANEOUS CORONARY STENT INTERVENTION (PCI-S);  Surgeon: Lorretta Harp, MD;  Location: [facility] CATH LAB;  Service: Cardiovascular;  Laterality: N/A;  . Left heart catheterization with coronary/graft angiogram N/A 03/10/2012    Procedure: LEFT HEART CATHETERIZATION WITH [name];  Surgeon: Sherren Mocha, MD;  Location: [facility] CATH LAB;  Service: Cardiovascular;  Laterality: N/A;  . Left heart catheterization with coronary angiogram N/A 06/18/2012    Procedure: LEFT HEART CATHETERIZATION WITH CORONARY ANGIOGRAM;  Surgeon: Peter M Martinique, MD;  Location: [facility] CATH LAB;  Service: Cardiovascular;  Laterality: N/A;  . Percutaneous coronary stent intervention (pci-s)  06/18/2012    Procedure: PERCUTANEOUS CORONARY STENT INTERVENTION (PCI-S);  Surgeon: Peter M Martinique, MD;  Location: [facility] CATH LAB;  Service: Cardiovascular;;  . Left heart catheterization with coronary/graft angiogram  10/06/2012    Procedure: LEFT HEART CATHETERIZATION WITH [name];  Surgeon: Burnell Blanks, MD;  Location: [facility] CATH LAB;  Service: Cardiovascular;;  . Percutaneous coronary stent intervention (pci-s)  10/06/2012    Procedure: PERCUTANEOUS CORONARY STENT INTERVENTION (PCI-S);  Surgeon: Burnell Blanks, MD;  Location: [facility] CATH LAB;  Service: Cardiovascular;;  . Left heart catheterization with coronary/graft angiogram N/A 11/09/2012    Procedure: LEFT HEART CATHETERIZATION WITH [name];  Surgeon: Peter M Martinique, MD;  Location: [facility] CATH LAB;  Service: Cardiovascular;  Laterality: N/A;  . Left heart catheterization with  coronary/graft angiogram N/A 05/19/2014    Procedure: LEFT HEART CATHETERIZATION WITH [name];  Surgeon: Troy Sine, MD;  Location: [facility] CATH LAB;  Service: Cardiovascular;  Laterality: N/A;   Family History  Problem Relation Age of Onset  . Early death      Parents died young  . Appendicitis Mother     Pt was 14 year old  . Heart attack Father 64   History  Substance Use Topics  . Smoking status: Former Smoker -- 2.00 packs/day for 10 years    Types: Cigarettes    Start date: 07/14/1950    Quit date: 07/14/1961  . Smokeless tobacco: Current User    Types: Chew  . Alcohol Use: No    Review of Systems  Constitutional: Positive for diaphoresis and appetite change.  Neurological: Positive for dizziness and weakness.  All other systems reviewed and are negative.     Allergies  Review of patient's allergies indicates no active allergies.  Home Medications   Prior to Admission medications   Medication Sig Start Date End Date Taking? Authorizing Provider  acetaminophen (TYLENOL) 325 MG tablet Take 2 tablets (650 mg total) by mouth every 4 (four) hours as needed for headache or mild pain. 05/21/14  Yes Luke K Kilroy, PA-C  albuterol (PROVENTIL HFA;VENTOLIN HFA) 108 (90 BASE) MCG/ACT inhaler Inhale 2 puffs into the lungs every 6 (six) hours as needed for wheezing or shortness of breath.   Yes Historical Provider, MD  aspirin EC 81 MG tablet Take 81 mg by mouth daily.   Yes Historical Provider, MD  atorvastatin (LIPITOR) 40 MG tablet Take 1 tablet (40 mg total) by mouth daily at 6 PM. 05/29/13  Yes Scott T Kathlen Mody, PA-C  bismuth subsalicylate (PEPTO BISMOL) 262 MG/15ML suspension Take 30 mLs by mouth every 6 (six) hours as needed for indigestion or diarrhea or loose stools.    Yes Historical Provider, MD  BRILINTA 90 MG TABS tablet TAKE 1 TABLET BY MOUTH TWICE DAILY. 07/18/14  Yes Herminio Commons, MD  carvedilol (COREG) 3.125 MG tablet TAKE 1 TABLET BY MOUTH TWICE  DAILY WITH MEALS. (HEART RATE & BLOOD PRESSURE) 09/07/14  Yes Herminio Commons, MD  furosemide (LASIX) 20 MG tablet Take 20 mg by mouth daily as needed for fluid. Take 1 tablet as needed if 3 lb weight gain. 05/06/13  Yes Lendon Colonel, NP  isosorbide mononitrate (IMDUR) 30 MG 24 hr tablet TAKE 1 TABLET BY MOUTH ONCE DAILY. 09/07/14  Yes Herminio Commons, MD  metFORMIN (GLUCOPHAGE) 500 MG tablet Take 2 tablets (1,000 mg total) by mouth 2 (two) times daily with a meal. 05/22/14  Yes Erlene Quan, PA-C  Multiple Vitamin (MULTIVITAMIN WITH MINERALS) TABS Take 1 tablet by mouth daily.   Yes Historical Provider, MD  nitroGLYCERIN (NITROSTAT) 0.4 MG SL tablet Place 0.4 mg under the tongue every 5 (five) minutes as needed for chest pain.   Yes Historical Provider, MD  oxyCODONE-acetaminophen (PERCOCET) 5-325 MG per tablet Take 1 tablet by mouth every 4 (four) hours as needed for moderate pain. 8/36/62  Yes Delora Fuel, MD  pantoprazole (PROTONIX) 40 MG tablet Take 40  mg by mouth daily.   Yes Historical Provider, MD  potassium chloride (K-DUR) 10 MEQ tablet Take 10 mEq by mouth daily. Every other day   Yes Historical Provider, MD  simethicone (MYLICON) 80 MG chewable tablet Chew 80 mg by mouth every 6 (six) hours as needed for flatulence.   Yes Historical Provider, MD  tamsulosin (FLOMAX) 0.4 MG CAPS Take 0.4 mg by mouth daily after supper.  01/27/13  Yes Historical Provider, MD   BP 107/63 mmHg  Pulse 85  Temp(Src) 98 F (36.7 C) (Oral)  Resp 16  Ht 5\' 10"  (1.778 m)  Wt 174 lb (78.926 kg)  BMI 24.97 kg/m2  SpO2 96% Physical Exam  Constitutional: He is oriented to person, place, and time. He appears well-developed and well-nourished.  HENT:  Head: Normocephalic and atraumatic.  Right Ear: External ear normal.  Left Ear: External ear normal.  Mucous membranes are somewhat dry  Eyes: Conjunctivae and EOM are normal. Pupils are equal, round, and reactive to light.  Neck: Normal range of  motion and phonation normal. Neck supple.  Cardiovascular: Normal rate, regular rhythm and normal heart sounds.   Pulmonary/Chest: Effort normal and breath sounds normal. He exhibits no bony tenderness.  Abdominal: Soft. There is no tenderness.  Musculoskeletal: Normal range of motion.  Neurological: He is alert and oriented to person, place, and time. No cranial nerve deficit or sensory deficit. He exhibits normal muscle tone. Coordination normal.  Skin: Skin is warm, dry and intact.  Psychiatric: He has a normal mood and affect. His behavior is normal. Judgment and thought content normal.  Nursing note and vitals reviewed.   ED Course  Procedures (including critical care time)  DIAGNOSTIC STUDIES: Oxygen Saturation is 99% on room air, normal by my interpretation.    COORDINATION OF CARE: 2:02 PM Discussed treatment plan with patient at beside, the patient agrees with the plan and has no further questions at this time.  Medications  sodium chloride 0.9 % bolus 500 mL (0 mLs Intravenous Stopped 09/21/14 1638)    Patient Vitals for the past 24 hrs:  BP Pulse Resp SpO2  09/21/14 1630 107/63 mmHg 85 16 96 %  09/21/14 1600 113/63 mmHg 82 17 97 %  09/21/14 1530 117/64 mmHg 82 13 96 %  09/21/14 1500 114/58 mmHg 81 19 95 %  09/21/14 1440 122/72 mmHg 97 17 99 %    At D/C- Reevaluation with update and discussion. After initial assessment and treatment, an updated evaluation reveals comfortable. Able to spontaneously void. Discussed findings with patient, all questions answered. Lissette Schenk L    Labs Review Labs Reviewed  URINALYSIS, ROUTINE W REFLEX MICROSCOPIC - Abnormal; Notable for the following:    Glucose, UA 250 (*)    All other components within normal limits  I-STAT CHEM 8, ED - Abnormal; Notable for the following:    Glucose, Bld 196 (*)    All other components within normal limits  URINE CULTURE    Imaging Review No results found.   EKG  Interpretation   Date/Time:  Thursday September 21 2014 13:14:32 EST Ventricular Rate:  92 PR Interval:  172 QRS Duration: 102 QT Interval:  376 QTC Calculation: 464 R Axis:   90 Text Interpretation:  Normal sinus rhythm Lateral infarct , age  undetermined Inferior infarct , age undetermined Abnormal ECG since last  tracing no significant change Confirmed by Eulis Foster  MD, Tarell Schollmeyer [phone]) on  09/21/2014 1:55:16 PM      MDM   Final  diagnoses:  Malaise    Nonspecific malaise. Doubt SBT, metabolic instability or impending vascular collapse  Nursing Notes Reviewed/ Care Coordinated Applicable Imaging Reviewed Interpretation of Laboratory Data incorporated into ED treatment  The patient appears reasonably screened and/or stabilized for discharge and I doubt any other medical condition or other [facility] requiring further screening, evaluation, or treatment in the ED at this time prior to discharge.  Plan: Home Medications- usual; Home Treatments- rest; return here if the recommended treatment, does not improve the symptoms; Recommended follow up- PCP prn   I personally performed the services described in this documentation, which was scribed in my presence. The recorded information has been reviewed and is accurate.     Daleen Bo, MD 09/22/14 [phone]

## 2014-09-21 NOTE — ED Notes (Signed)
Patient is aware of urine specimen, he has urinal with him

## 2014-09-23 LAB — URINE CULTURE
COLONY COUNT: NO GROWTH
Culture: NO GROWTH

## 2014-09-27 ENCOUNTER — Encounter: Payer: Self-pay | Admitting: Cardiovascular Disease

## 2014-09-27 ENCOUNTER — Ambulatory Visit (INDEPENDENT_AMBULATORY_CARE_PROVIDER_SITE_OTHER): Payer: Medicare Other | Admitting: Cardiovascular Disease

## 2014-09-27 VITALS — BP 116/86 | HR 85 | Ht 70.0 in | Wt 178.0 lb

## 2014-09-27 DIAGNOSIS — I10 Essential (primary) hypertension: Secondary | ICD-10-CM

## 2014-09-27 DIAGNOSIS — Z9289 Personal history of other medical treatment: Secondary | ICD-10-CM

## 2014-09-27 DIAGNOSIS — I25812 Atherosclerosis of bypass graft of coronary artery of transplanted heart without angina pectoris: Secondary | ICD-10-CM

## 2014-09-27 DIAGNOSIS — R42 Dizziness and giddiness: Secondary | ICD-10-CM

## 2014-09-27 DIAGNOSIS — E785 Hyperlipidemia, unspecified: Secondary | ICD-10-CM

## 2014-09-27 DIAGNOSIS — Z87898 Personal history of other specified conditions: Secondary | ICD-10-CM

## 2014-09-27 NOTE — Progress Notes (Signed)
Patient ID: Jeffrey [name], male   DOB: [birth year], 81 y.o.   MRN: [id number]      SUBJECTIVE: The patient returns for routine cardiovascular follow-up. He has a history of coronary artery disease and CABG. He was evaluated for dizziness in the emergency department on 09/21/14. I personally reviewed all labs and studies. Basic metabolic panel and urinalysis were unremarkable. ECG demonstrated sinus rhythm, heart rate 92 bpm, with both prior inferior and lateral wall infarcts with inferolateral T wave inversions. He is feeling well today and denies chest pain, dizziness, and shortness of breath. He has started drinking Gatorade and believes it has helped to some degree.   Review of Systems: As per "subjective", otherwise negative.  No Active Allergies  Current Outpatient Prescriptions  Medication Sig Dispense Refill  . acetaminophen (TYLENOL) 325 MG tablet Take 2 tablets (650 mg total) by mouth every 4 (four) hours as needed for headache or mild pain.    Marland Kitchen albuterol (PROVENTIL HFA;VENTOLIN HFA) 108 (90 BASE) MCG/ACT inhaler Inhale 2 puffs into the lungs every 6 (six) hours as needed for wheezing or shortness of breath.    Marland Kitchen aspirin EC 81 MG tablet Take 81 mg by mouth daily.    Marland Kitchen atorvastatin (LIPITOR) 40 MG tablet Take 1 tablet (40 mg total) by mouth daily at 6 PM. 30 tablet 11  . bismuth subsalicylate (PEPTO BISMOL) 262 MG/15ML suspension Take 30 mLs by mouth every 6 (six) hours as needed for indigestion or diarrhea or loose stools.     Marland Kitchen BRILINTA 90 MG TABS tablet TAKE 1 TABLET BY MOUTH TWICE DAILY. 60 tablet 6  . carvedilol (COREG) 3.125 MG tablet TAKE 1 TABLET BY MOUTH TWICE DAILY WITH MEALS. (HEART RATE & BLOOD PRESSURE) 60 tablet 11  . furosemide (LASIX) 20 MG tablet Take 20 mg by mouth daily as needed for fluid. Take 1 tablet as needed if 3 lb weight gain.    . isosorbide mononitrate (IMDUR) 30 MG 24 hr tablet TAKE 1 TABLET BY MOUTH ONCE DAILY. 30 tablet 11  . metFORMIN (GLUCOPHAGE) 500  MG tablet Take 2 tablets (1,000 mg total) by mouth 2 (two) times daily with a meal.    . Multiple Vitamin (MULTIVITAMIN WITH MINERALS) TABS Take 1 tablet by mouth daily.    . nitroGLYCERIN (NITROSTAT) 0.4 MG SL tablet Place 0.4 mg under the tongue every 5 (five) minutes as needed for chest pain.    Marland Kitchen oxyCODONE-acetaminophen (PERCOCET) 5-325 MG per tablet Take 1 tablet by mouth every 4 (four) hours as needed for moderate pain. 6 tablet 0  . pantoprazole (PROTONIX) 40 MG tablet Take 40 mg by mouth daily.    . potassium chloride (K-DUR) 10 MEQ tablet Take 10 mEq by mouth daily. Every other day    . simethicone (MYLICON) 80 MG chewable tablet Chew 80 mg by mouth every 6 (six) hours as needed for flatulence.    . tamsulosin (FLOMAX) 0.4 MG CAPS Take 0.4 mg by mouth daily after supper.      No current facility-administered medications for this visit.    Past Medical History  Diagnosis Date  . Diabetes mellitus, type II   . Hyperlipidemia   . Coronary atherosclerosis of native coronary artery     a. CABG x 4 in 1989 (VG->OM1->OM2, VG->RCA, LIMA->LAD), b. 05/2010: DES to VG-OM1/OM2, DES to distal LCx. c. NSTEMI in 04/2011 - TO distal LCX stent and VG->OM2. d. 02/2012 NSTEMI DES to VG-OM1/continuation to OM2 occluded. e. inferior STEMI s/p  DES to SVG-RAMUS 06/2012. f. inferolat STEMI 09/2012 s/p DES to SVG-interm; g. Lex MV (11/14):  EF 35%, inf-lat scar with small peri-infarct ischemia  . Essential hypertension, benign   . Osteoarthritis   . History of stroke   . History of pneumonia   . Cervical vertebral fracture   . Chronic back pain   . Benign prostatic hypertrophy     History of urinary retention  . Peptic ulcer disease   . Gastroesophageal reflux disease   . Ischemic cardiomyopathy Nov 2015    EF 35% cath, 45-50% by echo    Past Surgical History  Procedure Laterality Date  . Tonsillectomy    . Coronary artery bypass graft  1989  . Coronary angioplasty  10/12, 8/13, 12/13, 3/14     SVG-OM PCI  . Cardiac catheterization  05/19/14    SVG-OM occl- medical Rx  . Left heart catheterization with coronary angiogram N/A 11/01/2011    Procedure: LEFT HEART CATHETERIZATION WITH CORONARY ANGIOGRAM;  Surgeon: Lorretta Harp, MD;  Location: [facility] CATH LAB;  Service: Cardiovascular;  Laterality: N/A;  . Percutaneous coronary stent intervention (pci-s) N/A 11/01/2011    Procedure: PERCUTANEOUS CORONARY STENT INTERVENTION (PCI-S);  Surgeon: Lorretta Harp, MD;  Location: [facility] CATH LAB;  Service: Cardiovascular;  Laterality: N/A;  . Left heart catheterization with coronary/graft angiogram N/A 03/10/2012    Procedure: LEFT HEART CATHETERIZATION WITH [name];  Surgeon: Sherren Mocha, MD;  Location: [facility] CATH LAB;  Service: Cardiovascular;  Laterality: N/A;  . Left heart catheterization with coronary angiogram N/A 06/18/2012    Procedure: LEFT HEART CATHETERIZATION WITH CORONARY ANGIOGRAM;  Surgeon: Peter M Martinique, MD;  Location: [facility] CATH LAB;  Service: Cardiovascular;  Laterality: N/A;  . Percutaneous coronary stent intervention (pci-s)  06/18/2012    Procedure: PERCUTANEOUS CORONARY STENT INTERVENTION (PCI-S);  Surgeon: Peter M Martinique, MD;  Location: [facility] CATH LAB;  Service: Cardiovascular;;  . Left heart catheterization with coronary/graft angiogram  10/06/2012    Procedure: LEFT HEART CATHETERIZATION WITH [name];  Surgeon: Burnell Blanks, MD;  Location: [facility] CATH LAB;  Service: Cardiovascular;;  . Percutaneous coronary stent intervention (pci-s)  10/06/2012    Procedure: PERCUTANEOUS CORONARY STENT INTERVENTION (PCI-S);  Surgeon: Burnell Blanks, MD;  Location: [facility] CATH LAB;  Service: Cardiovascular;;  . Left heart catheterization with coronary/graft angiogram N/A 11/09/2012    Procedure: LEFT HEART CATHETERIZATION WITH [name];  Surgeon: Peter M Martinique, MD;  Location: [facility] CATH LAB;  Service: Cardiovascular;  Laterality: N/A;  . Left heart  catheterization with coronary/graft angiogram N/A 05/19/2014    Procedure: LEFT HEART CATHETERIZATION WITH [name];  Surgeon: Troy Sine, MD;  Location: [facility] CATH LAB;  Service: Cardiovascular;  Laterality: N/A;    History   Social History  . Marital Status: Divorced    Spouse Name: N/A  . Number of Children: N/A  . Years of Education: N/A   Occupational History  . Retired     Designer, television/film set   Social History Main Topics  . Smoking status: Former Smoker -- 2.00 packs/day for 10 years    Types: Cigarettes    Start date: 07/14/1950    Quit date: 07/14/1961  . Smokeless tobacco: Current User    Types: Chew  . Alcohol Use: No  . Drug Use: Not on file  . Sexual Activity: No   Other Topics Concern  . Not on file   Social History Narrative   ** Merged History Encounter **...   He was  raised by his uncle.  His mother deceased when   he was 75 year old with appendicitis. Father deceased from an MI at age   82.  He has 1 sister, but he does not know her health status as they   have been separated.            Filed Vitals:   09/27/14 1319  BP: 116/86  Pulse: 85  Height: 5\' 10"  (1.778 m)  Weight: 178 lb (80.74 kg)  SpO2: 95%    PHYSICAL EXAM General: NAD HEENT: Normal. Neck: No JVD, no thyromegaly. Lungs: Diminished but clear b/l. CV: Nondisplaced PMI.  Regular rate and rhythm, normal S1/S2, no S3/S4, no murmur. No pretibial or periankle edema.   Abdomen: Soft, nontender, no distention.  Neurologic: Alert and oriented x 3.  Psych: Normal affect. Skin: Normal. Musculoskeletal: No gross deformities. Extremities: No clubbing or cyanosis.   ECG: Most recent ECG reviewed.      ASSESSMENT AND PLAN: 1. CAD: Stable ischemic heart disease. Continue current therapy with ASA, Lipitor, low-dose Coreg, Imdur, and Brilinta (given multiple coronary interventions). 2. Essential HTN: Controlled with Coreg. No changes. 3. Hyperlipidemia: Continue Lipitor 40 mg  daily. Will obtain copy of lipids from PCP. 4. Dizziness: Resolved with increased fluid intake.  Dispo: f/u 6 months.  [name], M.D., F.A.C.C.

## 2014-09-27 NOTE — Patient Instructions (Signed)
Your physician wants you to follow-up in: 6 months with Jory Sims, NP. You will receive a reminder letter in the mail two months in advance. If you don't receive a letter, please call our office to schedule the follow-up appointment.  Your physician recommends that you continue on your current medications as directed. Please refer to the Current Medication list given to you today.  We will request lab results from your PCP   Thank you for choosing [facility]!

## 2014-10-06 ENCOUNTER — Other Ambulatory Visit: Payer: Self-pay | Admitting: Cardiovascular Disease

## 2014-10-12 ENCOUNTER — Other Ambulatory Visit: Payer: Self-pay

## 2014-10-12 NOTE — Patient Outreach (Signed)
[facility]) Care Management  10/12/2014  Jeffrey [name] 1929/03/04 [id number]   Referral received from [place] services for Telephonic Disease Management Services.  Outreach call #1 to patient for monthly Disease Management: (DM, CAD) contact call.   Per community  hand-off report Hx/o Social:  Single and living in retirement community.  Daughter close by. Patient still driving.  H/o inappropriate use of ED for matters that could be managed by Primary care.  H/o significant CAD history and has anxiety about more cardiac events.  H/o compliant with medication regime Hx/o DM with fair control; eats out a lot and self checks his glucose levels.   Patient not reached and unable to leave message; automation states voice mail box has not been set up yet.  RN CM will attempt outreach again within one week.   Mariann Laster, RN, BSN, [facility], CCM  [employer] Management Coordinator [phone] Office [phone] Direct [phone] Cell

## 2014-10-16 ENCOUNTER — Other Ambulatory Visit: Payer: Self-pay

## 2014-10-16 NOTE — Patient Outreach (Signed)
[facility]) Care Management  10/16/2014  Jeffrey [name] 1928/08/29 [id number]   Outreach call # 2.  Contact answered but hung up the phone after RN CM requested to speak with [name].  Chart review:  H/o ED visit 09/21/14 for C/O dizziness, weakness, diaphoresis and hypotension.   H/o IP x 3 and ED x 5 > past 6 months.    Mariann Laster, RN, BSN, [facility], CCM  [employer] Management Coordinator [phone] Office [phone] Direct [phone] Cell

## 2014-10-17 ENCOUNTER — Other Ambulatory Visit: Payer: Medicare Other

## 2014-10-17 ENCOUNTER — Ambulatory Visit: Payer: Self-pay

## 2014-10-17 NOTE — Patient Outreach (Signed)
[facility]) Care Management  10/17/2014  Jeffrey [name] 1928/09/10 [id number]   [place] Coordinator Outreach call #3 Contact answered but hung up the phone when RN CM requested to speak with Jeffrey [name].  RN CM will send unsuccessful outreach letter and review for any response in 10 days.  RN CM will close case if no response back from patient.   Mariann Laster, RN, BSN, [facility], CCM  [employer] Management Coordinator [phone] Office [phone] Direct [phone] Cell

## 2014-11-08 NOTE — Patient Outreach (Signed)
[facility]) Care Management  11/08/2014  Jeffrey [name] 04-Apr-1929 [id number]   Case Review:  No response received back from patient following unsuccessful outreach letter sent on 10/17/14 In-basket request to Lurline Del to close case.  RN CM will notify PCP, Dr. Redmond School of case closure.  Mariann Laster, RN, BSN, [facility], CCM  [employer] Management Coordinator [phone] Office [phone] Direct [phone] Cell

## 2014-11-09 NOTE — Patient Outreach (Signed)
[facility]) Care Management  11/09/2014  Jeffrey [name] 10-13-1928 [id number]   Received notification from Mariann Laster, RN to close case due to unable to reach.  [name]. [place] CM Assistant Phone: [phone] Fax: [phone]

## 2014-11-11 ENCOUNTER — Emergency Department (HOSPITAL_COMMUNITY)
Admission: EM | Admit: 2014-11-11 | Discharge: 2014-11-11 | Disposition: A | Discharge status: Home / Self Care | Payer: Medicare Other | Attending: Emergency Medicine | Admitting: Emergency Medicine

## 2014-11-11 ENCOUNTER — Encounter (HOSPITAL_COMMUNITY): Payer: Self-pay | Admitting: Emergency Medicine

## 2014-11-11 DIAGNOSIS — Z0000 Encounter for general adult medical examination without abnormal findings: Secondary | ICD-10-CM | POA: Insufficient documentation

## 2014-11-11 DIAGNOSIS — Z8701 Personal history of pneumonia (recurrent): Secondary | ICD-10-CM | POA: Insufficient documentation

## 2014-11-11 DIAGNOSIS — Z7982 Long term (current) use of aspirin: Secondary | ICD-10-CM | POA: Diagnosis not present

## 2014-11-11 DIAGNOSIS — I2510 Atherosclerotic heart disease of native coronary artery without angina pectoris: Secondary | ICD-10-CM | POA: Diagnosis not present

## 2014-11-11 DIAGNOSIS — M1990 Unspecified osteoarthritis, unspecified site: Secondary | ICD-10-CM | POA: Insufficient documentation

## 2014-11-11 DIAGNOSIS — G8929 Other chronic pain: Secondary | ICD-10-CM | POA: Insufficient documentation

## 2014-11-11 DIAGNOSIS — K219 Gastro-esophageal reflux disease without esophagitis: Secondary | ICD-10-CM | POA: Insufficient documentation

## 2014-11-11 DIAGNOSIS — Z951 Presence of aortocoronary bypass graft: Secondary | ICD-10-CM | POA: Insufficient documentation

## 2014-11-11 DIAGNOSIS — E119 Type 2 diabetes mellitus without complications: Secondary | ICD-10-CM | POA: Insufficient documentation

## 2014-11-11 DIAGNOSIS — Z9889 Other specified postprocedural states: Secondary | ICD-10-CM | POA: Insufficient documentation

## 2014-11-11 DIAGNOSIS — I10 Essential (primary) hypertension: Secondary | ICD-10-CM | POA: Diagnosis not present

## 2014-11-11 DIAGNOSIS — Z8673 Personal history of transient ischemic attack (TIA), and cerebral infarction without residual deficits: Secondary | ICD-10-CM | POA: Diagnosis not present

## 2014-11-11 DIAGNOSIS — Z8711 Personal history of peptic ulcer disease: Secondary | ICD-10-CM | POA: Insufficient documentation

## 2014-11-11 DIAGNOSIS — E785 Hyperlipidemia, unspecified: Secondary | ICD-10-CM | POA: Diagnosis not present

## 2014-11-11 DIAGNOSIS — Z9861 Coronary angioplasty status: Secondary | ICD-10-CM | POA: Diagnosis not present

## 2014-11-11 DIAGNOSIS — Z79899 Other long term (current) drug therapy: Secondary | ICD-10-CM | POA: Diagnosis not present

## 2014-11-11 DIAGNOSIS — N400 Enlarged prostate without lower urinary tract symptoms: Secondary | ICD-10-CM | POA: Insufficient documentation

## 2014-11-11 DIAGNOSIS — Z87891 Personal history of nicotine dependence: Secondary | ICD-10-CM | POA: Diagnosis not present

## 2014-11-11 DIAGNOSIS — I959 Hypotension, unspecified: Secondary | ICD-10-CM | POA: Diagnosis present

## 2014-11-11 NOTE — ED Notes (Signed)
PT stated he checked his b/p daily and this am was 85/45 at the drug store this am and he was told to come to ED for eval for systolic B/P for <505. PT denies any SOB/dizziness/chestpain at this time. PT drove self to ED.

## 2014-11-11 NOTE — ED Notes (Signed)
Pt states that he took his blood pressure when he got up and it was around 150/80, he states this is not normal for him. Pt went to the drug store and took his BP and he  Got a BP of 85/45 so he comes here.   Nurse walked through working the patients personal BP cuff to ensure it is being done correctly and validate there is no difference in BP on our monitor vs his personal monitor.   Our monitor reads 132/75 his is reading 146/73.

## 2014-11-11 NOTE — Discharge Instructions (Signed)
All ER BP reads are normal. Please continue with routine management of your blood pressure.  Hypertension Hypertension, commonly called high blood pressure, is when the force of blood pumping through your arteries is too strong. Your arteries are the blood vessels that carry blood from your heart throughout your body. A blood pressure reading consists of a higher number over a lower number, such as 110/72. The higher number (systolic) is the pressure inside your arteries when your heart pumps. The lower number (diastolic) is the pressure inside your arteries when your heart relaxes. Ideally you want your blood pressure below 120/80. Hypertension forces your heart to work harder to pump blood. Your arteries may become narrow or stiff. Having hypertension puts you at risk for heart disease, stroke, and other problems.  RISK FACTORS Some risk factors for high blood pressure are controllable. Others are not.  Risk factors you cannot control include:   Race. You may be at higher risk if you are African American.  Age. Risk increases with age.  Gender. Men are at higher risk than women before age 33 years. After age 24, women are at higher risk than men. Risk factors you can control include:  Not getting enough exercise or physical activity.  Being overweight.  Getting too much fat, sugar, calories, or salt in your diet.  Drinking too much alcohol. SIGNS AND SYMPTOMS Hypertension does not usually cause signs or symptoms. Extremely high blood pressure (hypertensive crisis) may cause headache, anxiety, shortness of breath, and nosebleed. DIAGNOSIS  To check if you have hypertension, your health care provider will measure your blood pressure while you are seated, with your arm held at the level of your heart. It should be measured at least twice using the same arm. Certain conditions can cause a difference in blood pressure between your right and left arms. A blood pressure reading that is higher  than normal on one occasion does not mean that you need treatment. If one blood pressure reading is high, ask your health care provider about having it checked again. TREATMENT  Treating high blood pressure includes making lifestyle changes and possibly taking medicine. Living a healthy lifestyle can help lower high blood pressure. You may need to change some of your habits. Lifestyle changes may include:  Following the DASH diet. This diet is high in fruits, vegetables, and whole grains. It is low in salt, red meat, and added sugars.  Getting at least 2 hours of brisk physical activity every week.  Losing weight if necessary.  Not smoking.  Limiting alcoholic beverages.  Learning ways to reduce stress. If lifestyle changes are not enough to get your blood pressure under control, your health care provider may prescribe medicine. You may need to take more than one. Work closely with your health care provider to understand the risks and benefits. HOME CARE INSTRUCTIONS  Have your blood pressure rechecked as directed by your health care provider.   Take medicines only as directed by your health care provider. Follow the directions carefully. Blood pressure medicines must be taken as prescribed. The medicine does not work as well when you skip doses. Skipping doses also puts you at risk for problems.   Do not smoke.   Monitor your blood pressure at home as directed by your health care provider. SEEK MEDICAL CARE IF:   You think you are having a reaction to medicines taken.  You have recurrent headaches or feel dizzy.  You have swelling in your ankles.  You have trouble  with your vision. SEEK IMMEDIATE MEDICAL CARE IF:  You develop a severe headache or confusion.  You have unusual weakness, numbness, or feel faint.  You have severe chest or abdominal pain.  You vomit repeatedly.  You have trouble breathing. MAKE SURE YOU:   Understand these instructions.  Will watch  your condition.  Will get help right away if you are not doing well or get worse. Document Released: 06/30/2005 Document Revised: 11/14/2013 Document Reviewed: 04/22/2013 [facility] Patient Information 2015 [place], [place]. This information is not intended to replace advice given to you by your health care provider. Make sure you discuss any questions you have with your health care provider.

## 2014-11-14 NOTE — ED Provider Notes (Signed)
CSN: [id number]     Arrival date & time 11/11/14  5956 History   First MD Initiated Contact with Patient 11/11/14 1302     Chief Complaint  Patient presents with  . Hypotension     (Consider location/radiation/quality/duration/timing/severity/associated sxs/prior Treatment) HPI Comments: Pt comes in with cc of low BP. States that he got his BP checked at a pharmacy, and the "upper number" was in the 80s, and so he got nervous and came to the ER. He denies any chest pain, dib, dizziness, near fainting. His BP is normal here. No new meds.  The history is provided by the patient.    Past Medical History  Diagnosis Date  . Diabetes mellitus, type II   . Hyperlipidemia   . Coronary atherosclerosis of native coronary artery     a. CABG x 4 in 1989 (VG->OM1->OM2, VG->RCA, LIMA->LAD), b. 05/2010: DES to VG-OM1/OM2, DES to distal LCx. c. NSTEMI in 04/2011 - TO distal LCX stent and VG->OM2. d. 02/2012 NSTEMI DES to VG-OM1/continuation to OM2 occluded. e. inferior STEMI s/p DES to SVG-RAMUS 06/2012. f. inferolat STEMI 09/2012 s/p DES to SVG-interm; g. Lex MV (11/14):  EF 35%, inf-lat scar with small peri-infarct ischemia  . Essential hypertension, benign   . Osteoarthritis   . History of stroke   . History of pneumonia   . Cervical vertebral fracture   . Chronic back pain   . Benign prostatic hypertrophy     History of urinary retention  . Peptic ulcer disease   . Gastroesophageal reflux disease   . Ischemic cardiomyopathy Nov 2015    EF 35% cath, 45-50% by echo   Past Surgical History  Procedure Laterality Date  . Tonsillectomy    . Coronary artery bypass graft  1989  . Coronary angioplasty  10/12, 8/13, 12/13, 3/14    SVG-OM PCI  . Cardiac catheterization  05/19/14    SVG-OM occl- medical Rx  . Left heart catheterization with coronary angiogram N/A 11/01/2011    Procedure: LEFT HEART CATHETERIZATION WITH CORONARY ANGIOGRAM;  Surgeon: Lorretta Harp, MD;  Location: [facility] CATH LAB;   Service: Cardiovascular;  Laterality: N/A;  . Percutaneous coronary stent intervention (pci-s) N/A 11/01/2011    Procedure: PERCUTANEOUS CORONARY STENT INTERVENTION (PCI-S);  Surgeon: Lorretta Harp, MD;  Location: [facility] CATH LAB;  Service: Cardiovascular;  Laterality: N/A;  . Left heart catheterization with coronary/graft angiogram N/A 03/10/2012    Procedure: LEFT HEART CATHETERIZATION WITH [name];  Surgeon: Sherren Mocha, MD;  Location: [facility] CATH LAB;  Service: Cardiovascular;  Laterality: N/A;  . Left heart catheterization with coronary angiogram N/A 06/18/2012    Procedure: LEFT HEART CATHETERIZATION WITH CORONARY ANGIOGRAM;  Surgeon: Peter M Martinique, MD;  Location: [facility] CATH LAB;  Service: Cardiovascular;  Laterality: N/A;  . Percutaneous coronary stent intervention (pci-s)  06/18/2012    Procedure: PERCUTANEOUS CORONARY STENT INTERVENTION (PCI-S);  Surgeon: Peter M Martinique, MD;  Location: [facility] CATH LAB;  Service: Cardiovascular;;  . Left heart catheterization with coronary/graft angiogram  10/06/2012    Procedure: LEFT HEART CATHETERIZATION WITH [name];  Surgeon: Burnell Blanks, MD;  Location: [facility] CATH LAB;  Service: Cardiovascular;;  . Percutaneous coronary stent intervention (pci-s)  10/06/2012    Procedure: PERCUTANEOUS CORONARY STENT INTERVENTION (PCI-S);  Surgeon: Burnell Blanks, MD;  Location: [facility] CATH LAB;  Service: Cardiovascular;;  . Left heart catheterization with coronary/graft angiogram N/A 11/09/2012    Procedure: LEFT HEART CATHETERIZATION WITH [name];  Surgeon: Peter M Martinique, MD;  Location: [place] CATH LAB;  Service: Cardiovascular;  Laterality: N/A;  . Left heart catheterization with coronary/graft angiogram N/A 05/19/2014    Procedure: LEFT HEART CATHETERIZATION WITH [name];  Surgeon: Troy Sine, MD;  Location: [facility] CATH LAB;  Service: Cardiovascular;  Laterality: N/A;   Family History  Problem Relation  Age of Onset  . Early death      Parents died young  . Appendicitis Mother     Pt was 81 year old  . Heart attack Father 69   History  Substance Use Topics  . Smoking status: Former Smoker -- 2.00 packs/day for 10 years    Types: Cigarettes    Start date: 07/14/1950    Quit date: 07/14/1961  . Smokeless tobacco: Current User    Types: Chew  . Alcohol Use: No    Review of Systems  Constitutional: Negative for activity change.  Respiratory: Negative for shortness of breath.   Cardiovascular: Negative for chest pain.  Neurological: Negative for dizziness and light-headedness.      Allergies  Review of patient's allergies indicates no known allergies.  Home Medications   Prior to Admission medications   Medication Sig Start Date End Date Taking? Authorizing Provider  acetaminophen (TYLENOL) 325 MG tablet Take 2 tablets (650 mg total) by mouth every 4 (four) hours as needed for headache or mild pain. 05/21/14  Yes Luke K Kilroy, PA-C  albuterol (PROVENTIL HFA;VENTOLIN HFA) 108 (90 BASE) MCG/ACT inhaler Inhale 2 puffs into the lungs every 6 (six) hours as needed for wheezing or shortness of breath.   Yes Historical Provider, MD  aspirin EC 81 MG tablet Take 81 mg by mouth daily.   Yes Historical Provider, MD  bismuth subsalicylate (PEPTO BISMOL) 262 MG/15ML suspension Take 30 mLs by mouth every 6 (six) hours as needed for indigestion or diarrhea or loose stools.    Yes Historical Provider, MD  furosemide (LASIX) 20 MG tablet Take 20 mg by mouth daily as needed for fluid. Take 1 tablet as needed if 3 lb weight gain. 05/06/13  Yes Lendon Colonel, NP  Multiple Vitamin (MULTIVITAMIN WITH MINERALS) TABS Take 1 tablet by mouth daily.   Yes Historical Provider, MD  nitroGLYCERIN (NITROSTAT) 0.4 MG SL tablet Place 0.4 mg under the tongue every 5 (five) minutes as needed for chest pain.   Yes Historical Provider, MD  potassium chloride (K-DUR) 10 MEQ tablet Take 10 mEq by mouth daily.  Every other day   Yes Historical Provider, MD  simethicone (MYLICON) 80 MG chewable tablet Chew 80 mg by mouth every 6 (six) hours as needed for flatulence.   Yes Historical Provider, MD  atorvastatin (LIPITOR) 40 MG tablet Take 1 tablet (40 mg total) by mouth daily at 6 PM. 05/29/13   Scott T Weaver, PA-C  BRILINTA 90 MG TABS tablet TAKE 1 TABLET BY MOUTH TWICE DAILY. 07/18/14   Herminio Commons, MD  carvedilol (COREG) 3.125 MG tablet TAKE 1 TABLET BY MOUTH TWICE DAILY WITH MEALS. (HEART RATE & BLOOD PRESSURE) 10/06/14   Herminio Commons, MD  isosorbide mononitrate (IMDUR) 30 MG 24 hr tablet TAKE 1 TABLET BY MOUTH ONCE DAILY. 09/07/14   Herminio Commons, MD  levothyroxine (SYNTHROID, LEVOTHROID) 50 MCG tablet  09/08/14   Historical Provider, MD  metFORMIN (GLUCOPHAGE) 500 MG tablet Take 2 tablets (1,000 mg total) by mouth 2 (two) times daily with a meal. 05/22/14   Erlene Quan, PA-C  oxyCODONE-acetaminophen (PERCOCET) 5-325 MG per tablet Take  1 tablet by mouth every 4 (four) hours as needed for moderate pain. Patient not taking: Reported on 11/11/2014 10/12/00   Delora Fuel, MD  pantoprazole (PROTONIX) 40 MG tablet Take 40 mg by mouth daily.    Historical Provider, MD  tamsulosin (FLOMAX) 0.4 MG CAPS Take 0.4 mg by mouth daily after supper.  01/27/13   Historical Provider, MD   BP 155/77 mmHg  Pulse 76  Temp(Src) 98.3 F (36.8 C) (Oral)  Resp 16  Ht 5\' 10"  (1.778 m)  Wt 175 lb (79.379 kg)  BMI 25.11 kg/m2  SpO2 100% Physical Exam  Constitutional: He appears well-developed.  HENT:  Head: Atraumatic.  Neck: Neck supple.  Cardiovascular: Normal rate and intact distal pulses.   Pulmonary/Chest: Effort normal.  Neurological: He is alert.  Skin: Skin is warm.  Nursing note and vitals reviewed.   ED Course  Procedures (including critical care time) Labs Review Labs Reviewed - No data to display  Imaging Review No results found.   EKG Interpretation None      MDM   Final  diagnoses:  Normal physical exam    Pt comes in with cc of low BP. He had normal BP here, and is asymptomatic. Pt was monitored in the ER for extended period of time, and his BP remained stable. Will d.c    Varney Biles, MD 11/14/14 [id number]

## 2014-11-30 ENCOUNTER — Other Ambulatory Visit: Payer: Self-pay | Admitting: *Deleted

## 2014-11-30 NOTE — Patient Outreach (Signed)
Jeffrey [name], who is a past [facility] care management pt of mine, called me to inform me he is concerned about his blood pressure being elevated 150/80 in the am on many occasions. He reports after he takes his am meds his blood pressure does come down to 130's/70's. He is afraid the first thing in the morning reading may be dangerous. I tried to reassure him that this reading is OK especially since his medications are working well. I told him I would pass this on to Dr. Gerarda Fraction for his consideration and guidance. The pt has had orthostatic hypotension on previous occasions with higher dosages of his blood pressure meds and this is certainly something to consider also.  I have put in a call to Dr. Nolon Rod office. He is not in the office this week but the message will be addressed by one of his associates. I left my name and phone number for further questions. I asked that a provider communicate with Jeffrey [name].  Jeffrey [name] [facility] [phone]

## 2014-12-02 ENCOUNTER — Encounter (HOSPITAL_COMMUNITY): Payer: Self-pay | Admitting: *Deleted

## 2014-12-02 ENCOUNTER — Emergency Department (HOSPITAL_COMMUNITY)
Admission: EM | Admit: 2014-12-02 | Discharge: 2014-12-02 | Disposition: A | Discharge status: Home / Self Care | Payer: Medicare Other | Attending: Emergency Medicine | Admitting: Emergency Medicine

## 2014-12-02 DIAGNOSIS — Z8673 Personal history of transient ischemic attack (TIA), and cerebral infarction without residual deficits: Secondary | ICD-10-CM | POA: Diagnosis not present

## 2014-12-02 DIAGNOSIS — K219 Gastro-esophageal reflux disease without esophagitis: Secondary | ICD-10-CM | POA: Insufficient documentation

## 2014-12-02 DIAGNOSIS — N400 Enlarged prostate without lower urinary tract symptoms: Secondary | ICD-10-CM | POA: Diagnosis not present

## 2014-12-02 DIAGNOSIS — Z79899 Other long term (current) drug therapy: Secondary | ICD-10-CM | POA: Insufficient documentation

## 2014-12-02 DIAGNOSIS — I10 Essential (primary) hypertension: Secondary | ICD-10-CM | POA: Diagnosis not present

## 2014-12-02 DIAGNOSIS — E119 Type 2 diabetes mellitus without complications: Secondary | ICD-10-CM | POA: Diagnosis not present

## 2014-12-02 DIAGNOSIS — I2510 Atherosclerotic heart disease of native coronary artery without angina pectoris: Secondary | ICD-10-CM | POA: Insufficient documentation

## 2014-12-02 DIAGNOSIS — M1990 Unspecified osteoarthritis, unspecified site: Secondary | ICD-10-CM | POA: Diagnosis not present

## 2014-12-02 DIAGNOSIS — E785 Hyperlipidemia, unspecified: Secondary | ICD-10-CM | POA: Insufficient documentation

## 2014-12-02 DIAGNOSIS — Z8701 Personal history of pneumonia (recurrent): Secondary | ICD-10-CM | POA: Diagnosis not present

## 2014-12-02 DIAGNOSIS — Z87891 Personal history of nicotine dependence: Secondary | ICD-10-CM | POA: Diagnosis not present

## 2014-12-02 DIAGNOSIS — Z8781 Personal history of (healed) traumatic fracture: Secondary | ICD-10-CM | POA: Insufficient documentation

## 2014-12-02 DIAGNOSIS — G8929 Other chronic pain: Secondary | ICD-10-CM | POA: Insufficient documentation

## 2014-12-02 DIAGNOSIS — Z7982 Long term (current) use of aspirin: Secondary | ICD-10-CM | POA: Insufficient documentation

## 2014-12-02 NOTE — Discharge Instructions (Signed)

## 2014-12-02 NOTE — ED Provider Notes (Addendum)
CSN: [id number]     Arrival date & time 12/02/14  1642 History   First MD Initiated Contact with Patient 12/02/14 1945     Chief Complaint  Patient presents with  . Hypertension     Patient is a 81 y.o. male presenting with hypertension. The history is provided by the patient.  Hypertension This is a recurrent problem. The problem occurs constantly. Pertinent negatives include no chest pain, no abdominal pain, no headaches and no shortness of breath. Nothing aggravates the symptoms. Nothing relieves the symptoms.    Past Medical History  Diagnosis Date  . Diabetes mellitus, type II   . Hyperlipidemia   . Coronary atherosclerosis of native coronary artery     a. CABG x 4 in 1989 (VG->OM1->OM2, VG->RCA, LIMA->LAD), b. 05/2010: DES to VG-OM1/OM2, DES to distal LCx. c. NSTEMI in 04/2011 - TO distal LCX stent and VG->OM2. d. 02/2012 NSTEMI DES to VG-OM1/continuation to OM2 occluded. e. inferior STEMI s/p DES to SVG-RAMUS 06/2012. f. inferolat STEMI 09/2012 s/p DES to SVG-interm; g. Lex MV (11/14):  EF 35%, inf-lat scar with small peri-infarct ischemia  . Essential hypertension, benign   . Osteoarthritis   . History of stroke   . History of pneumonia   . Cervical vertebral fracture   . Chronic back pain   . Benign prostatic hypertrophy     History of urinary retention  . Peptic ulcer disease   . Gastroesophageal reflux disease   . Ischemic cardiomyopathy Nov 2015    EF 35% cath, 45-50% by echo   Past Surgical History  Procedure Laterality Date  . Tonsillectomy    . Coronary artery bypass graft  1989  . Coronary angioplasty  10/12, 8/13, 12/13, 3/14    SVG-OM PCI  . Cardiac catheterization  05/19/14    SVG-OM occl- medical Rx  . Left heart catheterization with coronary angiogram N/A 11/01/2011    Procedure: LEFT HEART CATHETERIZATION WITH CORONARY ANGIOGRAM;  Surgeon: Lorretta Harp, MD;  Location: [facility] CATH LAB;  Service: Cardiovascular;  Laterality: N/A;  . Percutaneous coronary  stent intervention (pci-s) N/A 11/01/2011    Procedure: PERCUTANEOUS CORONARY STENT INTERVENTION (PCI-S);  Surgeon: Lorretta Harp, MD;  Location: [facility] CATH LAB;  Service: Cardiovascular;  Laterality: N/A;  . Left heart catheterization with coronary/graft angiogram N/A 03/10/2012    Procedure: LEFT HEART CATHETERIZATION WITH [name];  Surgeon: Sherren Mocha, MD;  Location: [facility] CATH LAB;  Service: Cardiovascular;  Laterality: N/A;  . Left heart catheterization with coronary angiogram N/A 06/18/2012    Procedure: LEFT HEART CATHETERIZATION WITH CORONARY ANGIOGRAM;  Surgeon: Peter M Martinique, MD;  Location: [facility] CATH LAB;  Service: Cardiovascular;  Laterality: N/A;  . Percutaneous coronary stent intervention (pci-s)  06/18/2012    Procedure: PERCUTANEOUS CORONARY STENT INTERVENTION (PCI-S);  Surgeon: Peter M Martinique, MD;  Location: [facility] CATH LAB;  Service: Cardiovascular;;  . Left heart catheterization with coronary/graft angiogram  10/06/2012    Procedure: LEFT HEART CATHETERIZATION WITH [name];  Surgeon: Burnell Blanks, MD;  Location: [facility] CATH LAB;  Service: Cardiovascular;;  . Percutaneous coronary stent intervention (pci-s)  10/06/2012    Procedure: PERCUTANEOUS CORONARY STENT INTERVENTION (PCI-S);  Surgeon: Burnell Blanks, MD;  Location: [facility] CATH LAB;  Service: Cardiovascular;;  . Left heart catheterization with coronary/graft angiogram N/A 11/09/2012    Procedure: LEFT HEART CATHETERIZATION WITH [name];  Surgeon: Peter M Martinique, MD;  Location: [facility] CATH LAB;  Service: Cardiovascular;  Laterality: N/A;  . Left heart catheterization  with coronary/graft angiogram N/A 05/19/2014    Procedure: LEFT HEART CATHETERIZATION WITH [name];  Surgeon: Troy Sine, MD;  Location: [facility] CATH LAB;  Service: Cardiovascular;  Laterality: N/A;   Family History  Problem Relation Age of Onset  . Early death      Parents died young  . Appendicitis  Mother     Pt was 52 year old  . Heart attack Father 61   History  Substance Use Topics  . Smoking status: Former Smoker -- 2.00 packs/day for 10 years    Types: Cigarettes    Start date: 07/14/1950    Quit date: 07/14/1961  . Smokeless tobacco: Current User    Types: Chew  . Alcohol Use: No    Review of Systems  Constitutional: Negative for fever.  Respiratory: Negative for shortness of breath.   Cardiovascular: Negative for chest pain.  Gastrointestinal: Negative for abdominal pain.  Neurological: Negative for weakness and headaches.  Psychiatric/Behavioral: The patient is nervous/anxious.   All other systems reviewed and are negative.     Allergies  Review of patient's allergies indicates no known allergies.  Home Medications   Prior to Admission medications   Medication Sig Start Date End Date Taking? Authorizing Provider  acetaminophen (TYLENOL) 325 MG tablet Take 2 tablets (650 mg total) by mouth every 4 (four) hours as needed for headache or mild pain. 05/21/14   Erlene Quan, PA-C  albuterol (PROVENTIL HFA;VENTOLIN HFA) 108 (90 BASE) MCG/ACT inhaler Inhale 2 puffs into the lungs every 6 (six) hours as needed for wheezing or shortness of breath.    Historical Provider, MD  aspirin EC 81 MG tablet Take 81 mg by mouth daily.    Historical Provider, MD  atorvastatin (LIPITOR) 40 MG tablet Take 1 tablet (40 mg total) by mouth daily at 6 PM. 05/29/13   Liliane Shi, PA-C  bismuth subsalicylate (PEPTO BISMOL) 262 MG/15ML suspension Take 30 mLs by mouth every 6 (six) hours as needed for indigestion or diarrhea or loose stools.     Historical Provider, MD  BRILINTA 90 MG TABS tablet TAKE 1 TABLET BY MOUTH TWICE DAILY. 07/18/14   Herminio Commons, MD  carvedilol (COREG) 3.125 MG tablet TAKE 1 TABLET BY MOUTH TWICE DAILY WITH MEALS. (HEART RATE & BLOOD PRESSURE) 10/06/14   Herminio Commons, MD  furosemide (LASIX) 20 MG tablet Take 20 mg by mouth daily as needed for fluid.  Take 1 tablet as needed if 3 lb weight gain. 05/06/13   Lendon Colonel, NP  isosorbide mononitrate (IMDUR) 30 MG 24 hr tablet TAKE 1 TABLET BY MOUTH ONCE DAILY. 09/07/14   Herminio Commons, MD  levothyroxine (SYNTHROID, LEVOTHROID) 50 MCG tablet  09/08/14   Historical Provider, MD  metFORMIN (GLUCOPHAGE) 500 MG tablet Take 2 tablets (1,000 mg total) by mouth 2 (two) times daily with a meal. 05/22/14   Erlene Quan, PA-C  Multiple Vitamin (MULTIVITAMIN WITH MINERALS) TABS Take 1 tablet by mouth daily.    Historical Provider, MD  nitroGLYCERIN (NITROSTAT) 0.4 MG SL tablet Place 0.4 mg under the tongue every 5 (five) minutes as needed for chest pain.    Historical Provider, MD  oxyCODONE-acetaminophen (PERCOCET) 5-325 MG per tablet Take 1 tablet by mouth every 4 (four) hours as needed for moderate pain. Patient not taking: Reported on 11/11/2014 10/21/79   Delora Fuel, MD  pantoprazole (PROTONIX) 40 MG tablet Take 40 mg by mouth daily.    Historical Provider, MD  potassium chloride (K-DUR) 10 MEQ tablet Take 10 mEq by mouth daily. Every other day    Historical Provider, MD  simethicone (MYLICON) 80 MG chewable tablet Chew 80 mg by mouth every 6 (six) hours as needed for flatulence.    Historical Provider, MD  tamsulosin (FLOMAX) 0.4 MG CAPS Take 0.4 mg by mouth daily after supper.  01/27/13   Historical Provider, MD   BP 128/77 mmHg  Pulse 71  Temp(Src) 97.6 F (36.4 C) (Oral)  Resp 16  Ht 5\' 10"  (1.778 m)  Wt 175 lb (79.379 kg)  BMI 25.11 kg/m2  SpO2 100% Physical Exam CONSTITUTIONAL: Well developed/well nourished HEAD: Normocephalic/atraumatic EYES: EOMI ENMT: Mucous membranes moist NECK: supple no meningeal signs SPINE/BACK:entire spine nontender CV: S1/S2 noted, no murmurs/rubs/gallops noted LUNGS: Lungs are clear to auscultation bilaterally, no apparent distress ABDOMEN: soft, nontender, no rebound or guarding, bowel sounds noted throughout abdomen NEURO: Pt is  awake/alert/appropriate, moves all extremitiesx4.  No facial droop.  No arm or leg drift.  No ataxia EXTREMITIES: pulses normal/equal, full ROM SKIN: warm, color normal PSYCH: no abnormalities of mood noted, alert and oriented to situation  ED Course  Procedures  Pt here for BP check He reports he checked it at home and it was high He had no symptoms - denied HA/CP/SOB He came in to be evaluated since it was high (reports SBP 180-190s) It is now improved He has no signs of hypertensive emergency Referred back to his PCP   MDM   Final diagnoses:  Essential hypertension    Nursing notes including past medical history and social history reviewed and considered in documentation     Ripley Fraise, MD 12/02/14 2156  Ripley Fraise, MD 12/13/14 [phone]

## 2014-12-02 NOTE — ED Notes (Signed)
Pt states BP was 190/05 prior to leaving home. States before leaving home he took two ASA and his BP med which he states he normally takes around this times. Denies CP or any other symptoms.

## 2014-12-07 ENCOUNTER — Encounter: Payer: Self-pay | Admitting: Adult Health

## 2014-12-07 ENCOUNTER — Ambulatory Visit (INDEPENDENT_AMBULATORY_CARE_PROVIDER_SITE_OTHER): Payer: Medicare Other | Admitting: Adult Health

## 2014-12-07 VITALS — BP 108/66 | HR 83 | Ht 70.0 in | Wt 178.0 lb

## 2014-12-07 DIAGNOSIS — F418 Other specified anxiety disorders: Secondary | ICD-10-CM | POA: Diagnosis not present

## 2014-12-07 DIAGNOSIS — I2510 Atherosclerotic heart disease of native coronary artery without angina pectoris: Secondary | ICD-10-CM

## 2014-12-07 DIAGNOSIS — I10 Essential (primary) hypertension: Secondary | ICD-10-CM

## 2014-12-07 NOTE — Patient Instructions (Signed)
Your physician wants you to follow-up in: 6 months with Kathryn Lawrence, NP. You will receive a reminder letter in the mail two months in advance. If you don't receive a letter, please call our office to schedule the follow-up appointment.  Your physician recommends that you continue on your current medications as directed. Please refer to the Current Medication list given to you today.  Thank you for choosing [facility]!   

## 2014-12-07 NOTE — Progress Notes (Signed)
Cardiology Office Note   Date:  12/07/2014   ID:  [name], DOB [birth year], MRN [id number]  PCP:  [name]., MD  Cardiologist:  Woodroe Chen, NP   Chief Complaint  Patient presents with  . Coronary Artery Disease    CABG      History of Present Illness: Jeffrey [name] is a 81 y.o. male who presents for on going assessment and management of CAD, with hx of CABG. He was last seen in the office on 09/27/2014 and was without complaints. He was continued on DAPT, BB, and statin. He is here for follow up after being seen in the ED for hypertension.    He has significant anxiety issues and often comes to ER. He has been having some social issues as well and speaks about them at length. He has bene taking extra doses of lisinopril when his BP goes up resulting in very low BP in the am. He states that he gets upset with his girlfriend and is often hypertensive with this.    He has not discussed this with his PCP. He has refused counseling or psychiatric evaluations. He denies chest pain.     Past Medical History  Diagnosis Date  . Diabetes mellitus, type II   . Hyperlipidemia   . Coronary atherosclerosis of native coronary artery     a. CABG x 4 in 1989 (VG->OM1->OM2, VG->RCA, LIMA->LAD), b. 05/2010: DES to VG-OM1/OM2, DES to distal LCx. c. NSTEMI in 04/2011 - TO distal LCX stent and VG->OM2. d. 02/2012 NSTEMI DES to VG-OM1/continuation to OM2 occluded. e. inferior STEMI s/p DES to SVG-RAMUS 06/2012. f. inferolat STEMI 09/2012 s/p DES to SVG-interm; g. Lex MV (11/14):  EF 35%, inf-lat scar with small peri-infarct ischemia  . Essential hypertension, benign   . Osteoarthritis   . History of stroke   . History of pneumonia   . Cervical vertebral fracture   . Chronic back pain   . Benign prostatic hypertrophy     History of urinary retention  . Peptic ulcer disease   . Gastroesophageal reflux disease   . Ischemic cardiomyopathy Nov 2015    EF 35% cath, 45-50%  by echo    Past Surgical History  Procedure Laterality Date  . Tonsillectomy    . Coronary artery bypass graft  1989  . Coronary angioplasty  10/12, 8/13, 12/13, 3/14    SVG-OM PCI  . Cardiac catheterization  05/19/14    SVG-OM occl- medical Rx  . Left heart catheterization with coronary angiogram N/A 11/01/2011    Procedure: LEFT HEART CATHETERIZATION WITH CORONARY ANGIOGRAM;  Surgeon: Lorretta Harp, MD;  Location: [facility] CATH LAB;  Service: Cardiovascular;  Laterality: N/A;  . Percutaneous coronary stent intervention (pci-s) N/A 11/01/2011    Procedure: PERCUTANEOUS CORONARY STENT INTERVENTION (PCI-S);  Surgeon: Lorretta Harp, MD;  Location: [facility] CATH LAB;  Service: Cardiovascular;  Laterality: N/A;  . Left heart catheterization with coronary/graft angiogram N/A 03/10/2012    Procedure: LEFT HEART CATHETERIZATION WITH [name];  Surgeon: Sherren Mocha, MD;  Location: [facility] CATH LAB;  Service: Cardiovascular;  Laterality: N/A;  . Left heart catheterization with coronary angiogram N/A 06/18/2012    Procedure: LEFT HEART CATHETERIZATION WITH CORONARY ANGIOGRAM;  Surgeon: Peter M Martinique, MD;  Location: [facility] CATH LAB;  Service: Cardiovascular;  Laterality: N/A;  . Percutaneous coronary stent intervention (pci-s)  06/18/2012    Procedure: PERCUTANEOUS CORONARY STENT INTERVENTION (PCI-S);  Surgeon: Peter M Martinique, MD;  Location: [facility] CATH LAB;  Service: Cardiovascular;;  . Left heart catheterization with coronary/graft angiogram  10/06/2012    Procedure: LEFT HEART CATHETERIZATION WITH [name];  Surgeon: Burnell Blanks, MD;  Location: [facility] CATH LAB;  Service: Cardiovascular;;  . Percutaneous coronary stent intervention (pci-s)  10/06/2012    Procedure: PERCUTANEOUS CORONARY STENT INTERVENTION (PCI-S);  Surgeon: Burnell Blanks, MD;  Location: [facility] CATH LAB;  Service: Cardiovascular;;  . Left heart catheterization with coronary/graft angiogram N/A 11/09/2012     Procedure: LEFT HEART CATHETERIZATION WITH [name];  Surgeon: Peter M Martinique, MD;  Location: [facility] CATH LAB;  Service: Cardiovascular;  Laterality: N/A;  . Left heart catheterization with coronary/graft angiogram N/A 05/19/2014    Procedure: LEFT HEART CATHETERIZATION WITH [name];  Surgeon: Troy Sine, MD;  Location: [facility] CATH LAB;  Service: Cardiovascular;  Laterality: N/A;     Current Outpatient Prescriptions  Medication Sig Dispense Refill  . acetaminophen (TYLENOL) 325 MG tablet Take 2 tablets (650 mg total) by mouth every 4 (four) hours as needed for headache or mild pain.    Marland Kitchen albuterol (PROVENTIL HFA;VENTOLIN HFA) 108 (90 BASE) MCG/ACT inhaler Inhale 2 puffs into the lungs every 6 (six) hours as needed for wheezing or shortness of breath.    Marland Kitchen aspirin EC 81 MG tablet Take 81 mg by mouth daily.    Marland Kitchen atorvastatin (LIPITOR) 40 MG tablet Take 1 tablet (40 mg total) by mouth daily at 6 PM. 30 tablet 11  . bismuth subsalicylate (PEPTO BISMOL) 262 MG/15ML suspension Take 30 mLs by mouth every 6 (six) hours as needed for indigestion or diarrhea or loose stools.     Marland Kitchen BRILINTA 90 MG TABS tablet TAKE 1 TABLET BY MOUTH TWICE DAILY. 60 tablet 6  . carvedilol (COREG) 3.125 MG tablet TAKE 1 TABLET BY MOUTH TWICE DAILY WITH MEALS. (HEART RATE & BLOOD PRESSURE) 60 tablet 6  . furosemide (LASIX) 20 MG tablet Take 20 mg by mouth daily as needed for fluid. Take 1 tablet as needed if 3 lb weight gain.    . isosorbide mononitrate (IMDUR) 30 MG 24 hr tablet TAKE 1 TABLET BY MOUTH ONCE DAILY. 30 tablet 11  . levothyroxine (SYNTHROID, LEVOTHROID) 50 MCG tablet     . metFORMIN (GLUCOPHAGE) 500 MG tablet Take 2 tablets (1,000 mg total) by mouth 2 (two) times daily with a meal.    . Multiple Vitamin (MULTIVITAMIN WITH MINERALS) TABS Take 1 tablet by mouth daily.    . nitroGLYCERIN (NITROSTAT) 0.4 MG SL tablet Place 0.4 mg under the tongue every 5 (five) minutes as needed for chest  pain.    Marland Kitchen oxyCODONE-acetaminophen (PERCOCET) 5-325 MG per tablet Take 1 tablet by mouth every 4 (four) hours as needed for moderate pain. 6 tablet 0  . pantoprazole (PROTONIX) 40 MG tablet Take 40 mg by mouth daily.    . potassium chloride (K-DUR) 10 MEQ tablet Take 10 mEq by mouth daily. Every other day    . simethicone (MYLICON) 80 MG chewable tablet Chew 80 mg by mouth every 6 (six) hours as needed for flatulence.    . tamsulosin (FLOMAX) 0.4 MG CAPS Take 0.4 mg by mouth daily after supper.      No current facility-administered medications for this visit.    Allergies:   Review of patient's allergies indicates no known allergies.    Social History:  The patient  reports that he quit smoking about 53 years ago. His smoking use included Cigarettes. He started smoking about 64 years  ago. He has a 20 pack-year smoking history. His smokeless tobacco use includes Chew. He reports that he does not drink alcohol.   Family History:  The patient's family history includes Appendicitis in his mother; Early death in an other family member; Heart attack (age of onset: 90) in his father.    ROS: .   All other systems are reviewed and negative.Unless otherwise mentioned in H&P above.   PHYSICAL EXAM: VS:  BP 108/66 mmHg  Pulse 83  Ht 5\' 10"  (1.778 m)  Wt 178 lb (80.74 kg)  BMI 25.54 kg/m2  SpO2 95% , BMI Body mass index is 25.54 kg/(m^2). GEN: Well nourished, well developed, in no acute distress HEENT: normal Neck: no JVD, carotid bruits, or masses Cardiac: RRR; no murmurs, rubs, or gallops,no edema  Respiratory:  Some bibasilar crackles.  GI: soft, nontender, nondistended, + BS MS: no deformity or atrophy Skin: warm and dry, no rash Neuro:  Strength and sensation are intact Psych: euthymic mood, full affect   Recent Labs: 05/18/2014: TSH 5.080* 06/02/2014: ALT 21 06/09/2014: Magnesium 1.8 06/21/2014: Platelets 193 09/21/2014: BUN 18; Creatinine 0.90; Hemoglobin 14.3; Potassium 4.8;  Sodium 138    Lipid Panel    Component Value Date/Time   CHOL 190 05/29/2013 0410   TRIG 335* 05/29/2013 0410   HDL 33* 05/29/2013 0410   CHOLHDL 5.8 05/29/2013 0410   VLDL 67* 05/29/2013 0410   LDLCALC 90 05/29/2013 0410      Wt Readings from Last 3 Encounters:  12/07/14 178 lb (80.74 kg)  12/02/14 175 lb (79.379 kg)  11/11/14 175 lb (79.379 kg)      Other studies Reviewed: Additional studies/ records that were reviewed today include: None Review of the above records demonstrates: N/A   ASSESSMENT AND PLAN:  1. CAD: No recurrent symptoms. Continue current medications 2. Hypertension: Advised not to take extra doses of lisinopril. He is to find other ways of coping with anxiety causing his BP to rise. I expressed my concerns of hypotension related response, especially since he is on Brilinta. Do not want syncopal episodes. He verbalizes understanding. 3. Mild dementia: Admits to being forgetful about medications and having frequent episodes of anxiety. He refuses psychiatric help. I have referred him back to his PCP.   Current medicines are reviewed at length with the patient today.    Labs/ tests ordered today include: Non No orders of the defined types were placed in this encounter.     Disposition:   FU with 6 months.   Signed, Jory Sims, NP  12/07/2014 4:37 PM    [facility] [address] [place], [place] [address] Phone: [phone]; Fax: [phone]

## 2014-12-07 NOTE — Progress Notes (Deleted)
Name: Jeffrey [name]    DOB: January 01, 1929  Age: 81 y.o.  MR#: [id number]       PCP:  [name]., MD      Insurance: Payor: MEDICARE / Plan: MEDICARE PART A AND B / Product Type: *No Product type* /   CC:    Chief Complaint  Patient presents with  . Coronary Artery Disease    CABG    VS Filed Vitals:   12/07/14 1454  BP: 108/66  Pulse: 83  Height: 5\' 10"  (1.778 m)  Weight: 178 lb (80.74 kg)  SpO2: 95%    Weights Current Weight  12/07/14 178 lb (80.74 kg)  12/02/14 175 lb (79.379 kg)  11/11/14 175 lb (79.379 kg)    Blood Pressure  BP Readings from Last 3 Encounters:  12/07/14 108/66  12/02/14 128/77  11/11/14 155/77     Admit date:  (Not on file) Last encounter with R[facility]:  07/18/2014   Allergy Review of patient's allergies indicates no known allergies.  Current Outpatient Prescriptions  Medication Sig Dispense Refill  . acetaminophen (TYLENOL) 325 MG tablet Take 2 tablets (650 mg total) by mouth every 4 (four) hours as needed for headache or mild pain.    Marland Kitchen albuterol (PROVENTIL HFA;VENTOLIN HFA) 108 (90 BASE) MCG/ACT inhaler Inhale 2 puffs into the lungs every 6 (six) hours as needed for wheezing or shortness of breath.    Marland Kitchen aspirin EC 81 MG tablet Take 81 mg by mouth daily.    Marland Kitchen atorvastatin (LIPITOR) 40 MG tablet Take 1 tablet (40 mg total) by mouth daily at 6 PM. 30 tablet 11  . bismuth subsalicylate (PEPTO BISMOL) 262 MG/15ML suspension Take 30 mLs by mouth every 6 (six) hours as needed for indigestion or diarrhea or loose stools.     Marland Kitchen BRILINTA 90 MG TABS tablet TAKE 1 TABLET BY MOUTH TWICE DAILY. 60 tablet 6  . carvedilol (COREG) 3.125 MG tablet TAKE 1 TABLET BY MOUTH TWICE DAILY WITH MEALS. (HEART RATE & BLOOD PRESSURE) 60 tablet 6  . furosemide (LASIX) 20 MG tablet Take 20 mg by mouth daily as needed for fluid. Take 1 tablet as needed if 3 lb weight gain.    . isosorbide mononitrate (IMDUR) 30 MG 24 hr tablet TAKE 1 TABLET BY MOUTH ONCE DAILY. 30 tablet 11  .  levothyroxine (SYNTHROID, LEVOTHROID) 50 MCG tablet     . metFORMIN (GLUCOPHAGE) 500 MG tablet Take 2 tablets (1,000 mg total) by mouth 2 (two) times daily with a meal.    . Multiple Vitamin (MULTIVITAMIN WITH MINERALS) TABS Take 1 tablet by mouth daily.    . nitroGLYCERIN (NITROSTAT) 0.4 MG SL tablet Place 0.4 mg under the tongue every 5 (five) minutes as needed for chest pain.    Marland Kitchen oxyCODONE-acetaminophen (PERCOCET) 5-325 MG per tablet Take 1 tablet by mouth every 4 (four) hours as needed for moderate pain. 6 tablet 0  . pantoprazole (PROTONIX) 40 MG tablet Take 40 mg by mouth daily.    . potassium chloride (K-DUR) 10 MEQ tablet Take 10 mEq by mouth daily. Every other day    . simethicone (MYLICON) 80 MG chewable tablet Chew 80 mg by mouth every 6 (six) hours as needed for flatulence.    . tamsulosin (FLOMAX) 0.4 MG CAPS Take 0.4 mg by mouth daily after supper.      No current facility-administered medications for this visit.    Discontinued Meds:   There are no discontinued medications.  Patient Active Problem  List   Diagnosis Date Noted  . Angina at rest 06/09/2014  . Abnormal ECG 06/09/2014  . Chest pain at rest 06/09/2014  . Dizziness 06/01/2014  . Unstable angina 05/19/2014  . History of Clostridium difficile 11/05/2013  . NSTEMI (non-ST elevated myocardial infarction) 08/29/2013  . Nausea & vomiting 08/27/2013  . Diarrhea 08/27/2013  . C. difficile diarrhea 08/27/2013  . Chest pain 06/16/2013  . Precordial chest pain 05/29/2013  . Coronary atherosclerosis of native coronary artery 05/29/2013  . Noncompliance 11/22/2012  . Anemia, normocytic normochromic   . Cardiomyopathy, ischemic 06/21/2012  . Cerebrovascular disease 06/08/2012  . Diabetes mellitus, type II   . Hypertension   . Arteriosclerotic cardiovascular disease (ASCVD) 10/25/2011  . Hyperlipidemia 10/24/2011  . BPH (benign prostatic hyperplasia) 05/07/2011    LABS    Component Value Date/Time   NA 138  09/21/2014 1425   NA 136 06/21/2014 1501   NA 136* 06/10/2014 0638   K 4.8 09/21/2014 1425   K 4.3 06/21/2014 1501   K 4.3 06/10/2014 0638   CL 101 09/21/2014 1425   CL 100 06/21/2014 1501   CL 100 06/10/2014 0638   CO2 28 06/21/2014 1501   CO2 26 06/10/2014 0638   CO2 22 06/09/2014 1034   GLUCOSE 196* 09/21/2014 1425   GLUCOSE 171* 06/21/2014 1501   GLUCOSE 223* 06/10/2014 0638   BUN 18 09/21/2014 1425   BUN 17 06/21/2014 1501   BUN 14 06/10/2014 0638   CREATININE 0.90 09/21/2014 1425   CREATININE 0.94 06/21/2014 1501   CREATININE 0.80 06/10/2014 0638   CREATININE 0.99 06/09/2014 1034   CREATININE 1.19 04/27/2013 1140   CREATININE 0.98 12/04/2011 1208   CALCIUM 9.6 06/21/2014 1501   CALCIUM 9.0 06/10/2014 0638   CALCIUM 9.7 06/09/2014 1034   GFRNONAA 75 06/21/2014 1501   GFRNONAA 80* 06/10/2014 0638   GFRNONAA 74* 06/09/2014 1034   GFRNONAA 80* 06/03/2014 0615   GFRAA 86 06/21/2014 1501   GFRAA >90 06/10/2014 0638   GFRAA 85* 06/09/2014 1034   GFRAA >90 06/03/2014 0615   CMP     Component Value Date/Time   NA 138 09/21/2014 1425   K 4.8 09/21/2014 1425   CL 101 09/21/2014 1425   CO2 28 06/21/2014 1501   GLUCOSE 196* 09/21/2014 1425   BUN 18 09/21/2014 1425   CREATININE 0.90 09/21/2014 1425   CREATININE 0.94 06/21/2014 1501   CALCIUM 9.6 06/21/2014 1501   PROT 6.3 06/02/2014 0614   ALBUMIN 3.5 06/02/2014 0614   AST 20 06/02/2014 0614   ALT 21 06/02/2014 0614   ALKPHOS 81 06/02/2014 0614   BILITOT 0.4 06/02/2014 0614   GFRNONAA 75 06/21/2014 1501   GFRNONAA 80* 06/10/2014 0638   GFRAA 86 06/21/2014 1501   GFRAA >90 06/10/2014 0638       Component Value Date/Time   WBC 7.9 06/21/2014 1501   WBC 5.9 06/10/2014 0638   WBC 7.4 06/09/2014 1034   HGB 14.3 09/21/2014 1425   HGB 13.1 06/21/2014 1501   HGB 13.3 06/10/2014 0638   HCT 42.0 09/21/2014 1425   HCT 40.1 06/21/2014 1501   HCT 38.8* 06/10/2014 0638   MCV 95.5 06/21/2014 1501   MCV 94.6 06/10/2014  0638   MCV 95.7 06/09/2014 1034    Lipid Panel     Component Value Date/Time   CHOL 190 05/29/2013 0410   TRIG 335* 05/29/2013 0410   HDL 33* 05/29/2013 0410   CHOLHDL 5.8 05/29/2013 0410   VLDL 67*  05/29/2013 0410   LDLCALC 90 05/29/2013 0410    ABG    Component Value Date/Time   TCO2 21 09/21/2014 1425     Lab Results  Component Value Date   TSH 5.080* 05/18/2014   BNP (last 3 results) No results for input(s): BNP in the last 8760 hours.  ProBNP (last 3 results) No results for input(s): PROBNP in the last 8760 hours.  Cardiac Panel (last 3 results) No results for input(s): CKTOTAL, CKMB, TROPONINI, RELINDX in the last 72 hours.  Iron/TIBC/Ferritin/ %Sat No results found for: IRON, TIBC, FERRITIN, IRONPCTSAT   EKG Orders placed or performed during the hospital encounter of 09/21/14  . EKG 12-Lead  . EKG 12-Lead  . EKG     Prior Assessment and Plan Problem List as of 12/07/2014      Cardiovascular and Mediastinum   Arteriosclerotic cardiovascular disease (ASCVD)   Last Assessment & Plan 10/13/2013 Office Visit Written 10/13/2013  4:57 PM by Lendon Colonel, NP    At this office visit, he has no cardiac complaints, his main complaint is recurrent diarrhea, frequent, occurring all day, with now associated listlessness. He has been sent over to the emergency room, at the [facility], for further evaluation. I am concerned that his C. difficile has returned, and he is becoming dehydrated again. I have spoken with Jenny Reichmann in the emergency room and have given him information on this patient concerning contact precautions in this setting. He is taken to the ER via wheelchair.      Hypertension   Last Assessment & Plan 10/13/2013 Office Visit Written 10/13/2013  4:58 PM by Lendon Colonel, NP    He is now taking lisinopril on his own, as his blood pressures are running low. Today in the office it is normal. With frequent diarrhea and possible dehydration, I agree with him  stopping this for now.      Cerebrovascular disease   Last Assessment & Plan 09/09/2012 Office Visit Written 09/09/2012  3:42 PM by Yehuda Savannah, MD    Recent carotid ultrasound negative for obstruction. Optimal control of cardiovascular risk factors appears effective in preventing progression of disease and will be continued.      Cardiomyopathy, ischemic   Last Assessment & Plan 05/06/2013 Office Visit Written 05/06/2013  2:05 PM by Lendon Colonel, NP    Improved EF per echo this month to 50%. No further evidence of fluid retention. He is breathing better without edema and is down 5 lbs. I have asked him to weigh himself daily. If he gain 3 lbs in 24 hours. He is to take one dose of lasix with potasium that day. He is not to take it on a daily basis. He is reminded of low sodium diet. We will see him in 6 months.      Coronary atherosclerosis of native coronary artery   NSTEMI (non-ST elevated myocardial infarction)   Unstable angina   Angina at rest     Digestive   Nausea & vomiting   C. difficile diarrhea   Last Assessment & Plan 10/13/2013 Office Visit Written 10/13/2013  4:59 PM by Lendon Colonel, NP    Sent to [facility] ER from our office to do to frequent stooling abdominal pain and known history of same 2 months ago. Contact precautions are recommended to ER staff over the phone on report to them concerning our transport to their clinic.        Endocrine   Diabetes mellitus, type  II   Last Assessment & Plan 04/27/2013 Office Visit Written 04/27/2013  1:54 PM by Lendon Colonel, NP    Followed by PCP        Genitourinary   BPH (benign prostatic hyperplasia)   Last Assessment & Plan 09/09/2012 Office Visit Edited 09/11/2012 11:21 PM by Yehuda Savannah, MD    Patient advised not to increase his dose of Flomax or other medications without specific instructions and will resume his usual daily dose.  He is scheduled to return to see Dr. Michela Pitcher in the near future for catheter  removal.        Other   Hyperlipidemia   Last Assessment & Plan 05/06/2013 Office Visit Written 05/06/2013  2:06 PM by Lendon Colonel, NP    Will need to have follow up lipids and LFT's in 3 months.      Anemia, normocytic normochromic   Last Assessment & Plan 04/27/2013 Office Visit Written 04/27/2013  1:55 PM by Lendon Colonel, NP    Stable. Most recent lab work last week. Continues on iron replacement.      Noncompliance   Last Assessment & Plan 04/27/2013 Office Visit Written 04/27/2013  1:54 PM by Lendon Colonel, NP    He is not adhering to low sodium diet. I have reinforced this to him. He is to weigh daily.      Precordial chest pain   Chest pain   Diarrhea   History of Clostridium difficile   Dizziness   Abnormal ECG   Chest pain at rest       Imaging: No results found.

## 2014-12-13 ENCOUNTER — Encounter (HOSPITAL_COMMUNITY): Payer: Self-pay

## 2014-12-13 ENCOUNTER — Emergency Department (HOSPITAL_COMMUNITY): Payer: Medicare Other

## 2014-12-13 ENCOUNTER — Emergency Department (HOSPITAL_COMMUNITY)
Admission: EM | Admit: 2014-12-13 | Discharge: 2014-12-13 | Disposition: A | Discharge status: Home / Self Care | Payer: Medicare Other | Attending: Emergency Medicine | Admitting: Emergency Medicine

## 2014-12-13 DIAGNOSIS — M1990 Unspecified osteoarthritis, unspecified site: Secondary | ICD-10-CM | POA: Diagnosis not present

## 2014-12-13 DIAGNOSIS — Z8673 Personal history of transient ischemic attack (TIA), and cerebral infarction without residual deficits: Secondary | ICD-10-CM | POA: Insufficient documentation

## 2014-12-13 DIAGNOSIS — Z8711 Personal history of peptic ulcer disease: Secondary | ICD-10-CM | POA: Insufficient documentation

## 2014-12-13 DIAGNOSIS — Y9389 Activity, other specified: Secondary | ICD-10-CM | POA: Diagnosis not present

## 2014-12-13 DIAGNOSIS — Z87891 Personal history of nicotine dependence: Secondary | ICD-10-CM | POA: Diagnosis not present

## 2014-12-13 DIAGNOSIS — E119 Type 2 diabetes mellitus without complications: Secondary | ICD-10-CM | POA: Insufficient documentation

## 2014-12-13 DIAGNOSIS — G8929 Other chronic pain: Secondary | ICD-10-CM | POA: Insufficient documentation

## 2014-12-13 DIAGNOSIS — K219 Gastro-esophageal reflux disease without esophagitis: Secondary | ICD-10-CM | POA: Insufficient documentation

## 2014-12-13 DIAGNOSIS — Z9861 Coronary angioplasty status: Secondary | ICD-10-CM | POA: Diagnosis not present

## 2014-12-13 DIAGNOSIS — Z79899 Other long term (current) drug therapy: Secondary | ICD-10-CM | POA: Diagnosis not present

## 2014-12-13 DIAGNOSIS — Y92410 Unspecified street and highway as the place of occurrence of the external cause: Secondary | ICD-10-CM | POA: Diagnosis not present

## 2014-12-13 DIAGNOSIS — Z8781 Personal history of (healed) traumatic fracture: Secondary | ICD-10-CM | POA: Insufficient documentation

## 2014-12-13 DIAGNOSIS — Z9889 Other specified postprocedural states: Secondary | ICD-10-CM | POA: Insufficient documentation

## 2014-12-13 DIAGNOSIS — S5011XA Contusion of right forearm, initial encounter: Secondary | ICD-10-CM | POA: Insufficient documentation

## 2014-12-13 DIAGNOSIS — E785 Hyperlipidemia, unspecified: Secondary | ICD-10-CM | POA: Insufficient documentation

## 2014-12-13 DIAGNOSIS — Z7982 Long term (current) use of aspirin: Secondary | ICD-10-CM | POA: Insufficient documentation

## 2014-12-13 DIAGNOSIS — S299XXA Unspecified injury of thorax, initial encounter: Secondary | ICD-10-CM | POA: Diagnosis not present

## 2014-12-13 DIAGNOSIS — I10 Essential (primary) hypertension: Secondary | ICD-10-CM | POA: Insufficient documentation

## 2014-12-13 DIAGNOSIS — Z23 Encounter for immunization: Secondary | ICD-10-CM | POA: Diagnosis not present

## 2014-12-13 DIAGNOSIS — Y998 Other external cause status: Secondary | ICD-10-CM | POA: Diagnosis not present

## 2014-12-13 DIAGNOSIS — Z951 Presence of aortocoronary bypass graft: Secondary | ICD-10-CM | POA: Insufficient documentation

## 2014-12-13 DIAGNOSIS — Z8701 Personal history of pneumonia (recurrent): Secondary | ICD-10-CM | POA: Diagnosis not present

## 2014-12-13 DIAGNOSIS — N400 Enlarged prostate without lower urinary tract symptoms: Secondary | ICD-10-CM | POA: Diagnosis not present

## 2014-12-13 DIAGNOSIS — I2510 Atherosclerotic heart disease of native coronary artery without angina pectoris: Secondary | ICD-10-CM | POA: Insufficient documentation

## 2014-12-13 MED ORDER — HYDROMORPHONE HCL 4 MG PO TABS: 4.0000 mg | ORAL_TABLET | ORAL | Status: DC | PRN

## 2014-12-13 MED ORDER — TETANUS-DIPHTH-ACELL PERTUSSIS 5-2.5-18.5 LF-MCG/0.5 IM SUSP
0.5000 mL | Freq: Once | INTRAMUSCULAR | Status: AC
  Administered 2014-12-13: 0.5 mL via INTRAMUSCULAR
  Filled 2014-12-13: qty 0.5

## 2014-12-13 NOTE — Discharge Instructions (Signed)
Tylenol for pain.  Follow up with your md next week.   

## 2014-12-13 NOTE — ED Notes (Signed)
Pt granddaughter called to ask about pt discharge and diagnosis during stay. She stated pt was acting "strange" and not "normal". Advised family member that if she felt pt was not acting at his baseline or if he needed medical attention to bring him back to the ED. She stated he was confused and did not  "want to come back", advised her that she could dial 911 if necessary to obtain emergency care for pt.
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
## 2014-12-13 NOTE — ED Notes (Addendum)
Discharge instructions given, pt demonstrated teach back and verbal understanding. No concerns voiced. Pt left via POV with Grandson. Given extra 4x4's, gauze wrap and Vaseline gauze dressing to wrap arms at home. Spoke with daughter in [place] via telephone and explained discharge and instructions, also noted EDP observations on phone call. Pt was A&O x4 on discharge, stated his grandson and granddaughter would help change his dressings at home as needed.

## 2014-12-16 ENCOUNTER — Encounter (HOSPITAL_COMMUNITY): Payer: Self-pay | Admitting: *Deleted

## 2014-12-16 ENCOUNTER — Emergency Department (HOSPITAL_COMMUNITY)
Admission: EM | Admit: 2014-12-16 | Discharge: 2014-12-16 | Disposition: A | Discharge status: Home / Self Care | Payer: Medicare Other | Attending: Emergency Medicine | Admitting: Emergency Medicine

## 2014-12-16 DIAGNOSIS — Z9861 Coronary angioplasty status: Secondary | ICD-10-CM | POA: Diagnosis not present

## 2014-12-16 DIAGNOSIS — K219 Gastro-esophageal reflux disease without esophagitis: Secondary | ICD-10-CM | POA: Diagnosis not present

## 2014-12-16 DIAGNOSIS — I2510 Atherosclerotic heart disease of native coronary artery without angina pectoris: Secondary | ICD-10-CM | POA: Insufficient documentation

## 2014-12-16 DIAGNOSIS — Z951 Presence of aortocoronary bypass graft: Secondary | ICD-10-CM | POA: Diagnosis not present

## 2014-12-16 DIAGNOSIS — S50812D Abrasion of left forearm, subsequent encounter: Secondary | ICD-10-CM | POA: Insufficient documentation

## 2014-12-16 DIAGNOSIS — T148XXA Other injury of unspecified body region, initial encounter: Secondary | ICD-10-CM

## 2014-12-16 DIAGNOSIS — G8929 Other chronic pain: Secondary | ICD-10-CM | POA: Insufficient documentation

## 2014-12-16 DIAGNOSIS — Z8701 Personal history of pneumonia (recurrent): Secondary | ICD-10-CM | POA: Insufficient documentation

## 2014-12-16 DIAGNOSIS — I10 Essential (primary) hypertension: Secondary | ICD-10-CM | POA: Insufficient documentation

## 2014-12-16 DIAGNOSIS — Z7982 Long term (current) use of aspirin: Secondary | ICD-10-CM | POA: Diagnosis not present

## 2014-12-16 DIAGNOSIS — N400 Enlarged prostate without lower urinary tract symptoms: Secondary | ICD-10-CM | POA: Diagnosis not present

## 2014-12-16 DIAGNOSIS — Z8711 Personal history of peptic ulcer disease: Secondary | ICD-10-CM | POA: Diagnosis not present

## 2014-12-16 DIAGNOSIS — S50811D Abrasion of right forearm, subsequent encounter: Secondary | ICD-10-CM | POA: Diagnosis not present

## 2014-12-16 DIAGNOSIS — T07XXXA Unspecified multiple injuries, initial encounter: Secondary | ICD-10-CM

## 2014-12-16 DIAGNOSIS — Z9889 Other specified postprocedural states: Secondary | ICD-10-CM | POA: Insufficient documentation

## 2014-12-16 DIAGNOSIS — Z87891 Personal history of nicotine dependence: Secondary | ICD-10-CM | POA: Diagnosis not present

## 2014-12-16 DIAGNOSIS — Z79899 Other long term (current) drug therapy: Secondary | ICD-10-CM | POA: Insufficient documentation

## 2014-12-16 DIAGNOSIS — E119 Type 2 diabetes mellitus without complications: Secondary | ICD-10-CM | POA: Diagnosis not present

## 2014-12-16 DIAGNOSIS — E785 Hyperlipidemia, unspecified: Secondary | ICD-10-CM | POA: Insufficient documentation

## 2014-12-16 DIAGNOSIS — M1990 Unspecified osteoarthritis, unspecified site: Secondary | ICD-10-CM | POA: Diagnosis not present

## 2014-12-16 DIAGNOSIS — S5011XD Contusion of right forearm, subsequent encounter: Secondary | ICD-10-CM | POA: Insufficient documentation

## 2014-12-16 DIAGNOSIS — Z8673 Personal history of transient ischemic attack (TIA), and cerebral infarction without residual deficits: Secondary | ICD-10-CM | POA: Insufficient documentation

## 2014-12-16 DIAGNOSIS — Z4800 Encounter for change or removal of nonsurgical wound dressing: Secondary | ICD-10-CM | POA: Insufficient documentation

## 2014-12-16 DIAGNOSIS — Z5189 Encounter for other specified aftercare: Secondary | ICD-10-CM

## 2014-12-16 MED ORDER — BACITRACIN ZINC 500 UNIT/GM EX OINT: 1.0000 "application " | TOPICAL_OINTMENT | Freq: Two times a day (BID) | CUTANEOUS | Status: DC
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
## 2014-12-16 NOTE — Discharge Instructions (Signed)
Read the information below.  Use the prescribed medication as directed.  Please discuss all new medications with your pharmacist.  You may return to the Emergency Department at any time for worsening condition or any new symptoms that concern you.   If you develop redness, swelling, pus draining from the wound, or fevers greater than 100.4, return to the ER immediately for a recheck.     Abrasion An abrasion is a cut or scrape of the skin. Abrasions do not extend through all layers of the skin and most heal within 10 days. It is important to care for your abrasion properly to prevent infection. CAUSES  Most abrasions are caused by falling on, or gliding across, the ground or other surface. When your skin rubs on something, the outer and inner layer of skin rubs off, causing an abrasion. DIAGNOSIS  Your caregiver will be able to diagnose an abrasion during a physical exam.  TREATMENT  Your treatment depends on how large and deep the abrasion is. Generally, your abrasion will be cleaned with water and a mild soap to remove any dirt or debris. An antibiotic ointment may be put over the abrasion to prevent an infection. A bandage (dressing) may be wrapped around the abrasion to keep it from getting dirty.  You may need a tetanus shot if:  You cannot remember when you had your last tetanus shot.  You have never had a tetanus shot.  The injury broke your skin. If you get a tetanus shot, your arm may swell, get red, and feel warm to the touch. This is common and not a problem. If you need a tetanus shot and you choose not to have one, there is a rare chance of getting tetanus. Sickness from tetanus can be serious.  HOME CARE INSTRUCTIONS   If a dressing was applied, change it at least once a day or as directed by your caregiver. If the bandage sticks, soak it off with warm water.   Wash the area with water and a mild soap to remove all the ointment 2 times a day. Rinse off the soap and pat the area  dry with a clean towel.   Reapply any ointment as directed by your caregiver. This will help prevent infection and keep the bandage from sticking. Use gauze over the wound and under the dressing to help keep the bandage from sticking.   Change your dressing right away if it becomes wet or dirty.   Only take over-the-counter or prescription medicines for pain, discomfort, or fever as directed by your caregiver.   Follow up with your caregiver within 24-48 hours for a wound check, or as directed. If you were not given a wound-check appointment, look closely at your abrasion for redness, swelling, or pus. These are signs of infection. SEEK IMMEDIATE MEDICAL CARE IF:   You have increasing pain in the wound.   You have redness, swelling, or tenderness around the wound.   You have pus coming from the wound.   You have a fever or persistent symptoms for more than 2-3 days.  You have a fever and your symptoms suddenly get worse.  You have a bad smell coming from the wound or dressing.  MAKE SURE YOU:   Understand these instructions.  Will watch your condition.  Will get help right away if you are not doing well or get worse. Document Released: 04/09/2005 Document Revised: 06/16/2012 Document Reviewed: 06/03/2011 [facility] Patient Information 2015 [place], [place]. This information is not intended  to replace advice given to you by your health care provider. Make sure you discuss any questions you have with your health care provider.  Contusion A contusion is a deep bruise. Contusions happen when an injury causes bleeding under the skin. Signs of bruising include pain, puffiness (swelling), and discolored skin. The contusion may turn blue, purple, or yellow. HOME CARE   Put ice on the injured area.  Put ice in a plastic bag.  Place a towel between your skin and the bag.  Leave the ice on for 15-20 minutes, 03-04 times a day.  Only take medicine as told by your doctor.  Rest  the injured area.  If possible, raise (elevate) the injured area to lessen puffiness. GET HELP RIGHT AWAY IF:   You have more bruising or puffiness.  You have pain that is getting worse.  Your puffiness or pain is not helped by medicine. MAKE SURE YOU:   Understand these instructions.  Will watch your condition.  Will get help right away if you are not doing well or get worse. Document Released: 12/17/2007 Document Revised: 09/22/2011 Document Reviewed: 05/05/2011 [facility] Patient Information 2015 [place], [place]. This information is not intended to replace advice given to you by your health care provider. Make sure you discuss any questions you have with your health care provider.
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
## 2014-12-16 NOTE — ED Notes (Addendum)
Pain to left mid back radiating down left leg. Pt states he fell yesterday at ~1530 while helping a friend move.
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
## 2014-12-16 NOTE — ED Notes (Signed)
MD Wentz at bedside.  

## 2015-01-07 ENCOUNTER — Encounter (HOSPITAL_COMMUNITY): Payer: Self-pay

## 2015-01-07 ENCOUNTER — Emergency Department (HOSPITAL_COMMUNITY)
Admission: EM | Admit: 2015-01-07 | Discharge: 2015-01-08 | Disposition: A | Discharge status: Home / Self Care | Payer: Medicare Other | Attending: Emergency Medicine | Admitting: Emergency Medicine

## 2015-01-07 DIAGNOSIS — Z79899 Other long term (current) drug therapy: Secondary | ICD-10-CM | POA: Diagnosis not present

## 2015-01-07 DIAGNOSIS — Z9889 Other specified postprocedural states: Secondary | ICD-10-CM | POA: Insufficient documentation

## 2015-01-07 DIAGNOSIS — G8929 Other chronic pain: Secondary | ICD-10-CM | POA: Insufficient documentation

## 2015-01-07 DIAGNOSIS — Z8711 Personal history of peptic ulcer disease: Secondary | ICD-10-CM | POA: Insufficient documentation

## 2015-01-07 DIAGNOSIS — R1084 Generalized abdominal pain: Secondary | ICD-10-CM | POA: Diagnosis present

## 2015-01-07 DIAGNOSIS — I10 Essential (primary) hypertension: Secondary | ICD-10-CM | POA: Insufficient documentation

## 2015-01-07 DIAGNOSIS — I2510 Atherosclerotic heart disease of native coronary artery without angina pectoris: Secondary | ICD-10-CM | POA: Diagnosis not present

## 2015-01-07 DIAGNOSIS — Z7982 Long term (current) use of aspirin: Secondary | ICD-10-CM | POA: Insufficient documentation

## 2015-01-07 DIAGNOSIS — M1990 Unspecified osteoarthritis, unspecified site: Secondary | ICD-10-CM | POA: Insufficient documentation

## 2015-01-07 DIAGNOSIS — R197 Diarrhea, unspecified: Secondary | ICD-10-CM | POA: Insufficient documentation

## 2015-01-07 DIAGNOSIS — N400 Enlarged prostate without lower urinary tract symptoms: Secondary | ICD-10-CM | POA: Diagnosis not present

## 2015-01-07 DIAGNOSIS — E119 Type 2 diabetes mellitus without complications: Secondary | ICD-10-CM | POA: Insufficient documentation

## 2015-01-07 DIAGNOSIS — Z87891 Personal history of nicotine dependence: Secondary | ICD-10-CM | POA: Insufficient documentation

## 2015-01-07 DIAGNOSIS — Z8701 Personal history of pneumonia (recurrent): Secondary | ICD-10-CM | POA: Diagnosis not present

## 2015-01-07 DIAGNOSIS — Z8781 Personal history of (healed) traumatic fracture: Secondary | ICD-10-CM | POA: Diagnosis not present

## 2015-01-07 DIAGNOSIS — Z9861 Coronary angioplasty status: Secondary | ICD-10-CM | POA: Diagnosis not present

## 2015-01-07 DIAGNOSIS — K219 Gastro-esophageal reflux disease without esophagitis: Secondary | ICD-10-CM | POA: Insufficient documentation

## 2015-01-07 DIAGNOSIS — E785 Hyperlipidemia, unspecified: Secondary | ICD-10-CM | POA: Diagnosis not present

## 2015-01-07 DIAGNOSIS — Z8673 Personal history of transient ischemic attack (TIA), and cerebral infarction without residual deficits: Secondary | ICD-10-CM | POA: Insufficient documentation

## 2015-01-07 DIAGNOSIS — Z951 Presence of aortocoronary bypass graft: Secondary | ICD-10-CM | POA: Insufficient documentation

## 2015-01-07 LAB — CBC WITH DIFFERENTIAL/PLATELET
Basophils Absolute: 0 10*3/uL (ref 0.0–0.1)
Basophils Relative: 0 % (ref 0–1)
EOS PCT: 3 % (ref 0–5)
Eosinophils Absolute: 0.3 10*3/uL (ref 0.0–0.7)
HCT: 40.9 % (ref 39.0–52.0)
Hemoglobin: 13.7 g/dL (ref 13.0–17.0)
LYMPHS PCT: 24 % (ref 12–46)
Lymphs Abs: 1.9 10*3/uL (ref 0.7–4.0)
MCH: 31.9 pg (ref 26.0–34.0)
MCHC: 33.5 g/dL (ref 30.0–36.0)
MCV: 95.3 fL (ref 78.0–100.0)
Monocytes Absolute: 0.7 10*3/uL (ref 0.1–1.0)
Monocytes Relative: 9 % (ref 3–12)
NEUTROS ABS: 4.9 10*3/uL (ref 1.7–7.7)
Neutrophils Relative %: 64 % (ref 43–77)
Platelets: 152 10*3/uL (ref 150–400)
RBC: 4.29 MIL/uL (ref 4.22–5.81)
RDW: 12.9 % (ref 11.5–15.5)
WBC: 7.8 10*3/uL (ref 4.0–10.5)

## 2015-01-07 LAB — BASIC METABOLIC PANEL
Anion gap: 9 (ref 5–15)
BUN: 15 mg/dL (ref 6–20)
CALCIUM: 9 mg/dL (ref 8.9–10.3)
CO2: 24 mmol/L (ref 22–32)
CREATININE: 0.87 mg/dL (ref 0.61–1.24)
Chloride: 103 mmol/L (ref 101–111)
GFR calc Af Amer: >60 mL/min (ref >60)
GFR calc non Af Amer: >60 mL/min (ref >60)
GLUCOSE: 196 mg/dL — AB (ref 65–99)
POTASSIUM: 4 mmol/L (ref 3.5–5.1)
Sodium: 136 mmol/L (ref 135–145)

## 2015-01-07 MED ORDER — SODIUM CHLORIDE 0.9 % IV BOLUS (SEPSIS)
250.0000 mL | Freq: Once | INTRAVENOUS | Status: AC
  Administered 2015-01-07: 250 mL via INTRAVENOUS

## 2015-01-07 MED ORDER — METRONIDAZOLE 500 MG PO TABS
500.0000 mg | ORAL_TABLET | Freq: Once | ORAL | Status: AC
  Administered 2015-01-07: 500 mg via ORAL
  Filled 2015-01-07: qty 1

## 2015-01-07 MED ORDER — METRONIDAZOLE 500 MG PO TABS: 500.0000 mg | ORAL_TABLET | Freq: Two times a day (BID) | ORAL | Status: DC

## 2015-01-07 MED ORDER — DIPHENOXYLATE-ATROPINE 2.5-0.025 MG PO TABS: 1.0000 | ORAL_TABLET | Freq: Four times a day (QID) | ORAL | Status: DC | PRN

## 2015-01-07 NOTE — ED Provider Notes (Signed)
CSN: [id number]     Arrival date & time 01/07/15  2034 History   This chart was scribed for Jeffrey Furry, MD by Forrestine Him, ED Scribe. This patient was seen in room APA06/APA06 and the patient's care was started 8:42 PM.    Chief Complaint  Patient presents with  . Abdominal Pain   HPI  HPI Comments: Jeffrey [name] is a 81 y.o. male with a PMHx of peptic ulcer disease who presents to the Emergency Department complaining of constant, ongoing, unchanged generalized abdominal pain x 1 day. Pt also reports diarrhea and dizziness. Stools described as black in color and runny. Several doses of OTC Pepto Bismol attempted prior to arrival without any improvement for symptoms. No recent fever, chills, vomiting, or nausea. Pt was diagnosed with Clostridium difficile last year requiring 5 weeks of treatment here at [facility], and 5 weeks of treatment at [facility]. PMHx includes DM, Hyperlipidemia, Coronary atherosclerosis, and HTN. No known allergies to medications.  Past Medical History  Diagnosis Date  . Diabetes mellitus, type II   . Hyperlipidemia   . Coronary atherosclerosis of native coronary artery     a. CABG x 4 in 1989 (VG->OM1->OM2, VG->RCA, LIMA->LAD), b. 05/2010: DES to VG-OM1/OM2, DES to distal LCx. c. NSTEMI in 04/2011 - TO distal LCX stent and VG->OM2. d. 02/2012 NSTEMI DES to VG-OM1/continuation to OM2 occluded. e. inferior STEMI s/p DES to SVG-RAMUS 06/2012. f. inferolat STEMI 09/2012 s/p DES to SVG-interm; g. Lex MV (11/14):  EF 35%, inf-lat scar with small peri-infarct ischemia  . Essential hypertension, benign   . Osteoarthritis   . History of stroke   . History of pneumonia   . Cervical vertebral fracture   . Chronic back pain   . Benign prostatic hypertrophy     History of urinary retention  . Peptic ulcer disease   . Gastroesophageal reflux disease   . Ischemic cardiomyopathy Nov 2015    EF 35% cath, 45-50% by echo   Past Surgical History  Procedure Laterality Date  .  Tonsillectomy    . Coronary artery bypass graft  1989  . Coronary angioplasty  10/12, 8/13, 12/13, 3/14    SVG-OM PCI  . Cardiac catheterization  05/19/14    SVG-OM occl- medical Rx  . Left heart catheterization with coronary angiogram N/A 11/01/2011    Procedure: LEFT HEART CATHETERIZATION WITH CORONARY ANGIOGRAM;  Surgeon: Lorretta Harp, MD;  Location: [facility] CATH LAB;  Service: Cardiovascular;  Laterality: N/A;  . Percutaneous coronary stent intervention (pci-s) N/A 11/01/2011    Procedure: PERCUTANEOUS CORONARY STENT INTERVENTION (PCI-S);  Surgeon: Lorretta Harp, MD;  Location: [facility] CATH LAB;  Service: Cardiovascular;  Laterality: N/A;  . Left heart catheterization with coronary/graft angiogram N/A 03/10/2012    Procedure: LEFT HEART CATHETERIZATION WITH [name];  Surgeon: Sherren Mocha, MD;  Location: [facility] CATH LAB;  Service: Cardiovascular;  Laterality: N/A;  . Left heart catheterization with coronary angiogram N/A 06/18/2012    Procedure: LEFT HEART CATHETERIZATION WITH CORONARY ANGIOGRAM;  Surgeon: Peter M Martinique, MD;  Location: [facility] CATH LAB;  Service: Cardiovascular;  Laterality: N/A;  . Percutaneous coronary stent intervention (pci-s)  06/18/2012    Procedure: PERCUTANEOUS CORONARY STENT INTERVENTION (PCI-S);  Surgeon: Peter M Martinique, MD;  Location: [facility] CATH LAB;  Service: Cardiovascular;;  . Left heart catheterization with coronary/graft angiogram  10/06/2012    Procedure: LEFT HEART CATHETERIZATION WITH [name];  Surgeon: Burnell Blanks, MD;  Location: [facility] CATH LAB;  Service: Cardiovascular;;  .  Percutaneous coronary stent intervention (pci-s)  10/06/2012    Procedure: PERCUTANEOUS CORONARY STENT INTERVENTION (PCI-S);  Surgeon: Burnell Blanks, MD;  Location: [facility] CATH LAB;  Service: Cardiovascular;;  . Left heart catheterization with coronary/graft angiogram N/A 11/09/2012    Procedure: LEFT HEART CATHETERIZATION WITH [name];   Surgeon: Peter M Martinique, MD;  Location: [facility] CATH LAB;  Service: Cardiovascular;  Laterality: N/A;  . Left heart catheterization with coronary/graft angiogram N/A 05/19/2014    Procedure: LEFT HEART CATHETERIZATION WITH [name];  Surgeon: Troy Sine, MD;  Location: [facility] CATH LAB;  Service: Cardiovascular;  Laterality: N/A;   Family History  Problem Relation Age of Onset  . Early death      Parents died young  . Appendicitis Mother     Pt was 89 year old  . Heart attack Father 45   History  Substance Use Topics  . Smoking status: Former Smoker -- 2.00 packs/day for 10 years    Types: Cigarettes    Start date: 07/14/1950    Quit date: 07/14/1961  . Smokeless tobacco: Current User    Types: Chew  . Alcohol Use: No    Review of Systems  Constitutional: Negative for fever, chills, diaphoresis, appetite change and fatigue.  HENT: Negative for mouth sores, sore throat and trouble swallowing.   Eyes: Negative for visual disturbance.  Respiratory: Negative for cough, chest tightness, shortness of breath and wheezing.   Cardiovascular: Negative for chest pain.  Gastrointestinal: Positive for abdominal pain and diarrhea. Negative for nausea, vomiting and abdominal distention.  Endocrine: Negative for polydipsia, polyphagia and polyuria.  Genitourinary: Negative for dysuria, frequency and hematuria.  Musculoskeletal: Negative for gait problem.  Skin: Negative for color change, pallor and rash.  Neurological: Negative for dizziness, syncope, light-headedness and headaches.  Hematological: Does not bruise/bleed easily.  Psychiatric/Behavioral: Negative for behavioral problems and confusion.      Allergies  Review of patient's allergies indicates no known allergies.  Home Medications   Prior to Admission medications   Medication Sig Start Date End Date Taking? Authorizing Provider  acetaminophen (TYLENOL) 325 MG tablet Take 2 tablets (650 mg total) by mouth every 4  (four) hours as needed for headache or mild pain. 05/21/14  Yes Luke K Kilroy, PA-C  albuterol (PROVENTIL HFA;VENTOLIN HFA) 108 (90 BASE) MCG/ACT inhaler Inhale 2 puffs into the lungs every 6 (six) hours as needed for wheezing or shortness of breath.   Yes Historical Provider, MD  aspirin EC 81 MG tablet Take 81 mg by mouth daily.   Yes Historical Provider, MD  atorvastatin (LIPITOR) 40 MG tablet Take 1 tablet (40 mg total) by mouth daily at 6 PM. 05/29/13  Yes Liliane Shi, PA-C  bacitracin ointment Apply 1 application topically 2 (two) times daily. 12/16/14  Yes Clayton Bibles, PA-C  bismuth subsalicylate (PEPTO BISMOL) 262 MG/15ML suspension Take 30 mLs by mouth every 6 (six) hours as needed for indigestion or diarrhea or loose stools.    Yes Historical Provider, MD  BRILINTA 90 MG TABS tablet TAKE 1 TABLET BY MOUTH TWICE DAILY. 07/18/14  Yes Herminio Commons, MD  carvedilol (COREG) 3.125 MG tablet TAKE 1 TABLET BY MOUTH TWICE DAILY WITH MEALS. (HEART RATE & BLOOD PRESSURE) 10/06/14  Yes Herminio Commons, MD  isosorbide mononitrate (IMDUR) 30 MG 24 hr tablet TAKE 1 TABLET BY MOUTH ONCE DAILY. 09/07/14  Yes Herminio Commons, MD  levothyroxine (SYNTHROID, LEVOTHROID) 50 MCG tablet Take 50 mcg by mouth daily before  breakfast.  09/08/14  Yes Historical Provider, MD  metFORMIN (GLUCOPHAGE) 500 MG tablet Take 2 tablets (1,000 mg total) by mouth 2 (two) times daily with a meal. 05/22/14  Yes Erlene Quan, PA-C  Multiple Vitamin (MULTIVITAMIN WITH MINERALS) TABS Take 1 tablet by mouth daily.   Yes Historical Provider, MD  pantoprazole (PROTONIX) 40 MG tablet Take 40 mg by mouth daily.   Yes Historical Provider, MD  potassium chloride (K-DUR) 10 MEQ tablet Take 10 mEq by mouth as needed. Takes along with the Furosemide when he has swelling.   Yes Historical Provider, MD  simethicone (MYLICON) 80 MG chewable tablet Chew 80 mg by mouth every 6 (six) hours as needed for flatulence.   Yes Historical Provider, MD   tamsulosin (FLOMAX) 0.4 MG CAPS Take 0.4 mg by mouth daily after supper.  01/27/13  Yes Historical Provider, MD  diphenoxylate-atropine (LOMOTIL) 2.5-0.025 MG per tablet Take 1 tablet by mouth 4 (four) times daily as needed for diarrhea or loose stools. 01/07/15   Jeffrey Furry, MD  furosemide (LASIX) 20 MG tablet Take 20 mg by mouth as needed for fluid. Take 1 tablet as needed if 3 lb weight gain. 05/06/13   Lendon Colonel, NP  HYDROmorphone (DILAUDID) 4 MG tablet Take 1 tablet (4 mg total) by mouth every 4 (four) hours as needed for severe pain. Patient not taking: Reported on 12/16/2014 12/13/14   Milton Ferguson, MD  metroNIDAZOLE (FLAGYL) 500 MG tablet Take 1 tablet (500 mg total) by mouth 2 (two) times daily. 01/07/15   Jeffrey Furry, MD  nitroGLYCERIN (NITROSTAT) 0.4 MG SL tablet Place 0.4 mg under the tongue every 5 (five) minutes as needed for chest pain.    Historical Provider, MD  oxyCODONE-acetaminophen (PERCOCET) 5-325 MG per tablet Take 1 tablet by mouth every 4 (four) hours as needed for moderate pain. Patient not taking: Reported on 12/16/2014 2/95/28   Delora Fuel, MD   Triage Vitals: BP 133/69 mmHg  Pulse 78  Temp(Src) 98.1 F (36.7 C) (Oral)  Resp 24  Ht 5\' 10"  (1.778 m)  Wt 170 lb (77.111 kg)  BMI 24.39 kg/m2  SpO2 98%    Physical Exam  Constitutional: He is oriented to person, place, and time. He appears well-developed and well-nourished. No distress.  HENT:  Head: Normocephalic.  Mildly dry mucous membranes  Eyes: Conjunctivae are normal. Pupils are equal, round, and reactive to light. No scleral icterus.  Neck: Normal range of motion. Neck supple. No thyromegaly present.  Cardiovascular: Normal rate and regular rhythm.  Exam reveals no gallop and no friction rub.   No murmur heard. Pulmonary/Chest: Effort normal and breath sounds normal. No respiratory distress. He has no wheezes. He has no rales.  Abdominal: Soft. Bowel sounds are normal. He exhibits no distension. There is  no tenderness. There is no rebound.  Hyperactive bowel sounds  Musculoskeletal: Normal range of motion.  Neurological: He is alert and oriented to person, place, and time.  Skin: Skin is warm and dry. No rash noted.  Healing abrasions to both arms  Psychiatric: He has a normal mood and affect. His behavior is normal.    ED Course  Procedures (including critical care time)  DIAGNOSTIC STUDIES: Oxygen Saturation is 98% on RA, Normal by my interpretation.    COORDINATION OF CARE: 8:47 PM- Will order CBC, CMP, and Clostridium Difficile. Discussed treatment plan with pt at bedside and pt agreed to plan.     Labs Review Labs Reviewed  BASIC  METABOLIC PANEL - Abnormal; Notable for the following:    Glucose, Bld 196 (*)    All other components within normal limits  CLOSTRIDIUM DIFFICILE BY PCR (NOT AT [facility])  CBC WITH DIFFERENTIAL/PLATELET    Imaging Review No results found.   EKG Interpretation None      MDM   Final diagnoses:  Diarrhea    I personally performed the services described in this documentation, which was scribed in my presence. The recorded information has been reviewed and is accurate.   I personally performed the services described in this documentation, which was scribed in my presence. The recorded information has been reviewed and is accurate.   Jeffrey Furry, MD 01/07/15 2340

## 2015-01-07 NOTE — Discharge Instructions (Signed)
Follow-up with Dr. Gerarda Fraction this week. Return to the ER with worsening symptoms.

## 2015-01-07 NOTE — ED Notes (Signed)
Patient c/o of generalized abdominal pain which begin today with diarrhea all day. Patient states he had a "bad bowel infection" about a year ago which he needed treatment for 5 weeks to get over.

## 2015-01-07 NOTE — ED Notes (Signed)
Patient verbalizes understanding of discharge instructions, prescription medications, home care and follow up care. Patient ambulatory out of department at this time with grand daughter.

## 2015-01-13 ENCOUNTER — Other Ambulatory Visit: Payer: Self-pay | Admitting: Cardiovascular Disease

## 2015-02-25 ENCOUNTER — Emergency Department (HOSPITAL_COMMUNITY)
Admission: EM | Admit: 2015-02-25 | Discharge: 2015-02-25 | Disposition: A | Discharge status: Home / Self Care | Payer: Medicare Other | Attending: Emergency Medicine | Admitting: Emergency Medicine

## 2015-02-25 ENCOUNTER — Encounter (HOSPITAL_COMMUNITY): Payer: Self-pay | Admitting: Emergency Medicine

## 2015-02-25 DIAGNOSIS — R5383 Other fatigue: Secondary | ICD-10-CM | POA: Diagnosis not present

## 2015-02-25 DIAGNOSIS — Z8781 Personal history of (healed) traumatic fracture: Secondary | ICD-10-CM | POA: Diagnosis not present

## 2015-02-25 DIAGNOSIS — Z87891 Personal history of nicotine dependence: Secondary | ICD-10-CM | POA: Diagnosis not present

## 2015-02-25 DIAGNOSIS — Z9889 Other specified postprocedural states: Secondary | ICD-10-CM | POA: Insufficient documentation

## 2015-02-25 DIAGNOSIS — M1990 Unspecified osteoarthritis, unspecified site: Secondary | ICD-10-CM | POA: Diagnosis not present

## 2015-02-25 DIAGNOSIS — Z951 Presence of aortocoronary bypass graft: Secondary | ICD-10-CM | POA: Insufficient documentation

## 2015-02-25 DIAGNOSIS — Z8711 Personal history of peptic ulcer disease: Secondary | ICD-10-CM | POA: Diagnosis not present

## 2015-02-25 DIAGNOSIS — Z8701 Personal history of pneumonia (recurrent): Secondary | ICD-10-CM | POA: Diagnosis not present

## 2015-02-25 DIAGNOSIS — R531 Weakness: Secondary | ICD-10-CM | POA: Diagnosis present

## 2015-02-25 DIAGNOSIS — Z9861 Coronary angioplasty status: Secondary | ICD-10-CM | POA: Diagnosis not present

## 2015-02-25 DIAGNOSIS — Z7982 Long term (current) use of aspirin: Secondary | ICD-10-CM | POA: Insufficient documentation

## 2015-02-25 DIAGNOSIS — K219 Gastro-esophageal reflux disease without esophagitis: Secondary | ICD-10-CM | POA: Diagnosis not present

## 2015-02-25 DIAGNOSIS — E785 Hyperlipidemia, unspecified: Secondary | ICD-10-CM | POA: Insufficient documentation

## 2015-02-25 DIAGNOSIS — Z8673 Personal history of transient ischemic attack (TIA), and cerebral infarction without residual deficits: Secondary | ICD-10-CM | POA: Diagnosis not present

## 2015-02-25 DIAGNOSIS — Z79899 Other long term (current) drug therapy: Secondary | ICD-10-CM | POA: Insufficient documentation

## 2015-02-25 DIAGNOSIS — N400 Enlarged prostate without lower urinary tract symptoms: Secondary | ICD-10-CM | POA: Insufficient documentation

## 2015-02-25 DIAGNOSIS — R739 Hyperglycemia, unspecified: Secondary | ICD-10-CM

## 2015-02-25 DIAGNOSIS — G8929 Other chronic pain: Secondary | ICD-10-CM | POA: Diagnosis not present

## 2015-02-25 DIAGNOSIS — E1165 Type 2 diabetes mellitus with hyperglycemia: Secondary | ICD-10-CM | POA: Insufficient documentation

## 2015-02-25 DIAGNOSIS — I10 Essential (primary) hypertension: Secondary | ICD-10-CM | POA: Insufficient documentation

## 2015-02-25 DIAGNOSIS — I2510 Atherosclerotic heart disease of native coronary artery without angina pectoris: Secondary | ICD-10-CM | POA: Insufficient documentation

## 2015-02-25 LAB — COMPREHENSIVE METABOLIC PANEL
ALBUMIN: 3.8 g/dL (ref 3.5–5.0)
ALT: 23 U/L (ref 17–63)
AST: 25 U/L (ref 15–41)
Alkaline Phosphatase: 81 U/L (ref 38–126)
Anion gap: 9 (ref 5–15)
BILIRUBIN TOTAL: 0.6 mg/dL (ref 0.3–1.2)
BUN: 13 mg/dL (ref 6–20)
CO2: 22 mmol/L (ref 22–32)
Calcium: 9.1 mg/dL (ref 8.9–10.3)
Chloride: 103 mmol/L (ref 101–111)
Creatinine, Ser: 0.89 mg/dL (ref 0.61–1.24)
GFR calc Af Amer: >60 mL/min (ref >60)
Glucose, Bld: 277 mg/dL — ABNORMAL HIGH (ref 65–99)
Potassium: 4.2 mmol/L (ref 3.5–5.1)
Sodium: 134 mmol/L — ABNORMAL LOW (ref 135–145)
Total Protein: 6.6 g/dL (ref 6.5–8.1)

## 2015-02-25 LAB — CBC
HCT: 39.4 % (ref 39.0–52.0)
Hemoglobin: 13.3 g/dL (ref 13.0–17.0)
MCH: 31.6 pg (ref 26.0–34.0)
MCHC: 33.8 g/dL (ref 30.0–36.0)
MCV: 93.6 fL (ref 78.0–100.0)
PLATELETS: 134 10*3/uL — AB (ref 150–400)
RBC: 4.21 MIL/uL — ABNORMAL LOW (ref 4.22–5.81)
RDW: 12.5 % (ref 11.5–15.5)
WBC: 7.7 10*3/uL (ref 4.0–10.5)

## 2015-02-25 LAB — TROPONIN I: Troponin I: <0.03 ng/mL (ref <0.031)

## 2015-02-25 LAB — URINALYSIS, ROUTINE W REFLEX MICROSCOPIC
Bilirubin Urine: NEGATIVE
Glucose, UA: 500 mg/dL — AB
Hgb urine dipstick: NEGATIVE
Ketones, ur: NEGATIVE mg/dL
LEUKOCYTES UA: NEGATIVE
NITRITE: NEGATIVE
Protein, ur: NEGATIVE mg/dL
Specific Gravity, Urine: 1.01 (ref 1.005–1.030)
UROBILINOGEN UA: 0.2 mg/dL (ref 0.0–1.0)
pH: 7 (ref 5.0–8.0)

## 2015-02-25 LAB — TSH: TSH: 0.296 u[IU]/mL — ABNORMAL LOW (ref 0.350–4.500)

## 2015-02-25 MED ORDER — SODIUM CHLORIDE 0.9 % IV BOLUS (SEPSIS)
1000.0000 mL | Freq: Once | INTRAVENOUS | Status: AC
  Administered 2015-02-25: 1000 mL via INTRAVENOUS

## 2015-02-25 NOTE — ED Notes (Signed)
Pt reports generalized weakness x4 days. Pt denies any cp,sob.

## 2015-02-25 NOTE — ED Provider Notes (Addendum)
CSN: [id number]     Arrival date & time 02/25/15  1053 History  This chart was scribed for Elnora Morrison, MD by Zola Button, ED Scribe. This patient was seen in room APA14/APA14 and the patient's care was started at 12:08 PM.      Chief Complaint  Patient presents with  . Weakness   Patient is a 81 y.o. male presenting with weakness.  Weakness This is a new problem. The current episode started more than 2 days ago. The problem occurs constantly. The problem has not changed since onset.Pertinent negatives include no chest pain, no abdominal pain, no headaches and no shortness of breath. He has tried nothing for the symptoms.   HPI Comments: Jeffrey [name] is a 81 y.o. male with a history of DM, HLD, coronary atherosclerosis of native coronary artery, stroke, and HTN who presents to the Emergency Department complaining of generalized weakness/fatigue that started 4 days ago. Patient also reports having diaphoresis (even without activity). He notes that he has been doing work outside. Patient denies chest pain, SOB, cough, abdominal pain, nausea, vomiting, visual changes, sore throat, difficulty swallowing, focalized weakness, and headache. He also denies new medications and recent surgeries in the past 2 months. He notes that he has not had any recent cardiac testing.   Past Medical History  Diagnosis Date  . Diabetes mellitus, type II   . Hyperlipidemia   . Coronary atherosclerosis of native coronary artery     a. CABG x 4 in 1989 (VG->OM1->OM2, VG->RCA, LIMA->LAD), b. 05/2010: DES to VG-OM1/OM2, DES to distal LCx. c. NSTEMI in 04/2011 - TO distal LCX stent and VG->OM2. d. 02/2012 NSTEMI DES to VG-OM1/continuation to OM2 occluded. e. inferior STEMI s/p DES to SVG-RAMUS 06/2012. f. inferolat STEMI 09/2012 s/p DES to SVG-interm; g. Lex MV (11/14):  EF 35%, inf-lat scar with small peri-infarct ischemia  . Essential hypertension, benign   . Osteoarthritis   . History of stroke   . History of  pneumonia   . Cervical vertebral fracture   . Chronic back pain   . Benign prostatic hypertrophy     History of urinary retention  . Peptic ulcer disease   . Gastroesophageal reflux disease   . Ischemic cardiomyopathy Nov 2015    EF 35% cath, 45-50% by echo   Past Surgical History  Procedure Laterality Date  . Tonsillectomy    . Coronary artery bypass graft  1989  . Coronary angioplasty  10/12, 8/13, 12/13, 3/14    SVG-OM PCI  . Cardiac catheterization  05/19/14    SVG-OM occl- medical Rx  . Left heart catheterization with coronary angiogram N/A 11/01/2011    Procedure: LEFT HEART CATHETERIZATION WITH CORONARY ANGIOGRAM;  Surgeon: Lorretta Harp, MD;  Location: [facility] CATH LAB;  Service: Cardiovascular;  Laterality: N/A;  . Percutaneous coronary stent intervention (pci-s) N/A 11/01/2011    Procedure: PERCUTANEOUS CORONARY STENT INTERVENTION (PCI-S);  Surgeon: Lorretta Harp, MD;  Location: [facility] CATH LAB;  Service: Cardiovascular;  Laterality: N/A;  . Left heart catheterization with coronary/graft angiogram N/A 03/10/2012    Procedure: LEFT HEART CATHETERIZATION WITH [name];  Surgeon: Sherren Mocha, MD;  Location: [facility] CATH LAB;  Service: Cardiovascular;  Laterality: N/A;  . Left heart catheterization with coronary angiogram N/A 06/18/2012    Procedure: LEFT HEART CATHETERIZATION WITH CORONARY ANGIOGRAM;  Surgeon: Peter M Martinique, MD;  Location: [facility] CATH LAB;  Service: Cardiovascular;  Laterality: N/A;  . Percutaneous coronary stent intervention (pci-s)  06/18/2012  Procedure: PERCUTANEOUS CORONARY STENT INTERVENTION (PCI-S);  Surgeon: Peter M Martinique, MD;  Location: [facility] CATH LAB;  Service: Cardiovascular;;  . Left heart catheterization with coronary/graft angiogram  10/06/2012    Procedure: LEFT HEART CATHETERIZATION WITH [name];  Surgeon: Burnell Blanks, MD;  Location: [facility] CATH LAB;  Service: Cardiovascular;;  . Percutaneous coronary stent intervention  (pci-s)  10/06/2012    Procedure: PERCUTANEOUS CORONARY STENT INTERVENTION (PCI-S);  Surgeon: Burnell Blanks, MD;  Location: [facility] CATH LAB;  Service: Cardiovascular;;  . Left heart catheterization with coronary/graft angiogram N/A 11/09/2012    Procedure: LEFT HEART CATHETERIZATION WITH [name];  Surgeon: Peter M Martinique, MD;  Location: [facility] CATH LAB;  Service: Cardiovascular;  Laterality: N/A;  . Left heart catheterization with coronary/graft angiogram N/A 05/19/2014    Procedure: LEFT HEART CATHETERIZATION WITH [name];  Surgeon: Troy Sine, MD;  Location: [facility] CATH LAB;  Service: Cardiovascular;  Laterality: N/A;   Family History  Problem Relation Age of Onset  . Early death      Parents died young  . Appendicitis Mother     Pt was 52 year old  . Heart attack Father 74   Social History  Substance Use Topics  . Smoking status: Former Smoker -- 2.00 packs/day for 10 years    Types: Cigarettes    Start date: 07/14/1950    Quit date: 07/14/1961  . Smokeless tobacco: Current User    Types: Chew  . Alcohol Use: No    Review of Systems  Constitutional: Positive for diaphoresis and fatigue.  HENT: Negative for sore throat and trouble swallowing.   Eyes: Negative for visual disturbance.  Respiratory: Negative for cough and shortness of breath.   Cardiovascular: Negative for chest pain.  Gastrointestinal: Negative for nausea, vomiting and abdominal pain.  Neurological: Positive for weakness. Negative for headaches.      Allergies  Review of patient's allergies indicates no known allergies.  Home Medications   Prior to Admission medications   Medication Sig Start Date End Date Taking? Authorizing Provider  acetaminophen (TYLENOL) 325 MG tablet Take 2 tablets (650 mg total) by mouth every 4 (four) hours as needed for headache or mild pain. 05/21/14  Yes Luke K Kilroy, PA-C  albuterol (PROVENTIL HFA;VENTOLIN HFA) 108 (90 BASE) MCG/ACT inhaler  Inhale 2 puffs into the lungs every 6 (six) hours as needed for wheezing or shortness of breath.   Yes Historical Provider, MD  aspirin EC 81 MG tablet Take 81 mg by mouth daily.   Yes Historical Provider, MD  atorvastatin (LIPITOR) 40 MG tablet Take 1 tablet (40 mg total) by mouth daily at 6 PM. 05/29/13  Yes Scott T Kathlen Mody, PA-C  bismuth subsalicylate (PEPTO BISMOL) 262 MG/15ML suspension Take 30 mLs by mouth every 6 (six) hours as needed for indigestion or diarrhea or loose stools.    Yes Historical Provider, MD  BRILINTA 90 MG TABS tablet TAKE 1 TABLET BY MOUTH TWICE DAILY. 01/16/15  Yes Herminio Commons, MD  carvedilol (COREG) 3.125 MG tablet TAKE 1 TABLET BY MOUTH TWICE DAILY WITH MEALS. (HEART RATE & BLOOD PRESSURE) 10/06/14  Yes Herminio Commons, MD  diphenoxylate-atropine (LOMOTIL) 2.5-0.025 MG per tablet Take 1 tablet by mouth 4 (four) times daily as needed for diarrhea or loose stools. 01/07/15  Yes Tanna Furry, MD  furosemide (LASIX) 20 MG tablet Take 20 mg by mouth as needed for fluid. Take 1 tablet as needed if 3 lb weight gain. 05/06/13  Yes Curt Bears  Brooks Sailors, NP  isosorbide mononitrate (IMDUR) 30 MG 24 hr tablet TAKE 1 TABLET BY MOUTH ONCE DAILY. 09/07/14  Yes Herminio Commons, MD  levothyroxine (SYNTHROID, LEVOTHROID) 50 MCG tablet Take 50 mcg by mouth daily before breakfast.  09/08/14  Yes Historical Provider, MD  metFORMIN (GLUCOPHAGE) 500 MG tablet Take 2 tablets (1,000 mg total) by mouth 2 (two) times daily with a meal. 05/22/14  Yes Erlene Quan, PA-C  Multiple Vitamin (MULTIVITAMIN WITH MINERALS) TABS Take 1 tablet by mouth daily.   Yes Historical Provider, MD  nitroGLYCERIN (NITROSTAT) 0.4 MG SL tablet Place 0.4 mg under the tongue every 5 (five) minutes as needed for chest pain.   Yes Historical Provider, MD  pantoprazole (PROTONIX) 40 MG tablet Take 40 mg by mouth daily.   Yes Historical Provider, MD  potassium chloride (K-DUR) 10 MEQ tablet Take 10 mEq by mouth as needed.  Takes along with the Furosemide when he has swelling.   Yes Historical Provider, MD  simethicone (MYLICON) 80 MG chewable tablet Chew 80 mg by mouth every 6 (six) hours as needed for flatulence.   Yes Historical Provider, MD  tamsulosin (FLOMAX) 0.4 MG CAPS Take 0.4 mg by mouth daily after supper.  01/27/13  Yes Historical Provider, MD  bacitracin ointment Apply 1 application topically 2 (two) times daily. Patient not taking: Reported on 02/25/2015 12/16/14   Clayton Bibles, PA-C  HYDROmorphone (DILAUDID) 4 MG tablet Take 1 tablet (4 mg total) by mouth every 4 (four) hours as needed for severe pain. Patient not taking: Reported on 12/16/2014 12/13/14   Milton Ferguson, MD  metroNIDAZOLE (FLAGYL) 500 MG tablet Take 1 tablet (500 mg total) by mouth 2 (two) times daily. Patient not taking: Reported on 02/25/2015 01/07/15   Tanna Furry, MD  oxyCODONE-acetaminophen (PERCOCET) 5-325 MG per tablet Take 1 tablet by mouth every 4 (four) hours as needed for moderate pain. Patient not taking: Reported on 12/16/2014 3/53/61   Delora Fuel, MD   BP 443/15 mmHg  Pulse 69  Temp(Src) 97.9 F (36.6 C) (Oral)  Resp 14  Ht 5\' 10"  (1.778 m)  Wt 170 lb (77.111 kg)  BMI 24.39 kg/m2  SpO2 96% Physical Exam  Constitutional: He is oriented to person, place, and time. He appears well-developed and well-nourished. No distress.  Well appearing.  HENT:  Head: Normocephalic and atraumatic.  Mouth/Throat: Oropharynx is clear and moist. No oropharyngeal exudate.  Mild dry mm  Eyes: EOM are normal. Pupils are equal, round, and reactive to light.  Visual field intact to fingers.  Neck: Neck supple.  Cardiovascular: Normal rate and regular rhythm.   Pulmonary/Chest: Effort normal and breath sounds normal. No respiratory distress. He has no wheezes. He has no rales.  Clear to auscultation bilaterally.   Musculoskeletal: He exhibits no edema.  Neurological: He is alert and oriented to person, place, and time. No cranial nerve deficit.   No obvious facial droop. FNF intact. No arm drift. Equal strength in arms.\ 5+ strength in UE and LE with f/e at major joints. Sensation to palpation intact in UE and LE. CNs 2-12 grossly intact.  EOMFI.  PERRL.   Finger nose and coordination intact bilateral.   Visual fields intact to finger testing. No nystagmus   Skin: Skin is warm and dry. No rash noted.  Psychiatric: He has a normal mood and affect. His behavior is normal.  Nursing note and vitals reviewed.   ED Course  Procedures  DIAGNOSTIC STUDIES: Oxygen Saturation is 99%  on room air, normal by my interpretation.    COORDINATION OF CARE: 12:17 PM-Discussed treatment plan which includes labs with patient/guardian at bedside and patient/guardian agreed to plan.    Labs Review Labs Reviewed  CBC - Abnormal; Notable for the following:    RBC 4.21 (*)    Platelets 134 (*)    All other components within normal limits  COMPREHENSIVE METABOLIC PANEL - Abnormal; Notable for the following:    Sodium 134 (*)    Glucose, Bld 277 (*)    All other components within normal limits  URINALYSIS, ROUTINE W REFLEX MICROSCOPIC (NOT AT [facility]) - Abnormal; Notable for the following:    Glucose, UA 500 (*)    All other components within normal limits  TSH - Abnormal; Notable for the following:    TSH 0.296 (*)    All other components within normal limits  TROPONIN I    Imaging Review No results found. I personally reviewed and evaluated these images and lab results as part of my medical decision-making.   EKG Interpretation   Date/Time:  Sunday February 25 2015 11:15:47 EDT Ventricular Rate:  78 PR Interval:  176 QRS Duration: 112 QT Interval:  420 QTC Calculation: 478 R Axis:   88 Text Interpretation:  Sinus rhythm Borderline intraventricular conduction  delay Nonspecific repol abnormality, lateral leads Baseline wander in  lead(s) V1 Confirmed by Kileen Lange  MD, Brynnley Dayrit ([id number]) on 02/25/2015 12:15:59 PM      MDM   Final  diagnoses:  Other fatigue  Hyperglycemia  Patient presents with general weakness, increased thirst. Patient well-appearing on exam, blood work unremarkable except for elevated glucose. Thyroid testing mild abnormal patient can follow-up outpatient for this. Discussed may also be related to his heart with the general weakness in his cardiac history. EKG overall similar to previous/improved. [facility] cardiology to assist with outpatient follow-up as patient says he has difficulty making appointments. Asian to follow closely with primary doctor earlier this week as well. May also be medication side effect for which primary Dr. can go through with the patient. Patient concerned about heat exhaustion, fluids given in the ER. He should tolerate oral fluids. Spoke with cardiologist who will have nursing team call the patient small to arrange an appointment. Results and differential diagnosis were discussed with the patient/parent/guardian. Xrays were independently reviewed by myself.  Close follow up outpatient was discussed, comfortable with the plan.   Medications  sodium chloride 0.9 % bolus 1,000 mL (1,000 mLs Intravenous New Bag/Given 02/25/15 1230)    Filed Vitals:   02/25/15 1107 02/25/15 1108 02/25/15 1300 02/25/15 1330  BP:  137/77 120/77 142/77  Pulse:  85 69   Temp:  97.9 F (36.6 C)    TempSrc:  Oral    Resp:  16 18 14   Height: 5\' 10"  (1.778 m)     Weight: 170 lb (77.111 kg)     SpO2:  99% 96%     Final diagnoses:  Other fatigue      Elnora Morrison, MD 02/25/15 1349  Elnora Morrison, MD 02/25/15 1414

## 2015-02-25 NOTE — Discharge Instructions (Signed)
Stay well-hydrated. Follow-up with your primary doctor and cardiology this week for further evaluation of your general weakness.  If you were given medicines take as directed.  If you are on coumadin or contraceptives realize their levels and effectiveness is altered by many different medicines.  If you have any reaction (rash, tongues swelling, other) to the medicines stop taking and see a physician.    If your blood pressure was elevated in the ER make sure you follow up for management with a primary doctor or return for chest pain, shortness of breath or stroke symptoms.  Please follow up as directed and return to the ER or see a physician for new or worsening symptoms.  Thank you. Filed Vitals:   02/25/15 1107 02/25/15 1108 02/25/15 1300 02/25/15 1330  BP:  137/77 120/77 142/77  Pulse:  85 69   Temp:  97.9 F (36.6 C)    TempSrc:  Oral    Resp:  16 18 14   Height: 5\' 10"  (1.778 m)     Weight: 170 lb (77.111 kg)     SpO2:  99% 96%

## 2015-02-25 NOTE — ED Notes (Signed)
Pt ate all of his lunch, drinking po fluids well. Pt ambulated to restroom without difficulty. Pt remains very pleasant. VSS

## 2015-03-02 ENCOUNTER — Observation Stay (HOSPITAL_COMMUNITY)
Admission: EM | Admit: 2015-03-02 | Discharge: 2015-03-03 | Disposition: A | Discharge status: Home / Self Care | Payer: Medicare Other | Attending: Internal Medicine | Admitting: Internal Medicine

## 2015-03-02 ENCOUNTER — Emergency Department (HOSPITAL_BASED_OUTPATIENT_CLINIC_OR_DEPARTMENT_OTHER): Payer: Medicare Other

## 2015-03-02 ENCOUNTER — Emergency Department (HOSPITAL_COMMUNITY): Payer: Medicare Other

## 2015-03-02 ENCOUNTER — Encounter (HOSPITAL_COMMUNITY): Payer: Self-pay | Admitting: Emergency Medicine

## 2015-03-02 DIAGNOSIS — R42 Dizziness and giddiness: Secondary | ICD-10-CM

## 2015-03-02 DIAGNOSIS — E039 Hypothyroidism, unspecified: Secondary | ICD-10-CM | POA: Diagnosis not present

## 2015-03-02 DIAGNOSIS — Z7982 Long term (current) use of aspirin: Secondary | ICD-10-CM | POA: Insufficient documentation

## 2015-03-02 DIAGNOSIS — K219 Gastro-esophageal reflux disease without esophagitis: Secondary | ICD-10-CM | POA: Diagnosis not present

## 2015-03-02 DIAGNOSIS — Z87891 Personal history of nicotine dependence: Secondary | ICD-10-CM | POA: Insufficient documentation

## 2015-03-02 DIAGNOSIS — R9431 Abnormal electrocardiogram [ECG] [EKG]: Secondary | ICD-10-CM

## 2015-03-02 DIAGNOSIS — D649 Anemia, unspecified: Secondary | ICD-10-CM

## 2015-03-02 DIAGNOSIS — R109 Unspecified abdominal pain: Secondary | ICD-10-CM | POA: Diagnosis present

## 2015-03-02 DIAGNOSIS — I10 Essential (primary) hypertension: Secondary | ICD-10-CM | POA: Insufficient documentation

## 2015-03-02 DIAGNOSIS — I214 Non-ST elevation (NSTEMI) myocardial infarction: Secondary | ICD-10-CM

## 2015-03-02 DIAGNOSIS — R197 Diarrhea, unspecified: Secondary | ICD-10-CM

## 2015-03-02 DIAGNOSIS — Z8701 Personal history of pneumonia (recurrent): Secondary | ICD-10-CM | POA: Insufficient documentation

## 2015-03-02 DIAGNOSIS — E785 Hyperlipidemia, unspecified: Secondary | ICD-10-CM | POA: Insufficient documentation

## 2015-03-02 DIAGNOSIS — Z79899 Other long term (current) drug therapy: Secondary | ICD-10-CM | POA: Insufficient documentation

## 2015-03-02 DIAGNOSIS — N400 Enlarged prostate without lower urinary tract symptoms: Secondary | ICD-10-CM

## 2015-03-02 DIAGNOSIS — G8929 Other chronic pain: Secondary | ICD-10-CM | POA: Diagnosis not present

## 2015-03-02 DIAGNOSIS — E119 Type 2 diabetes mellitus without complications: Secondary | ICD-10-CM | POA: Diagnosis not present

## 2015-03-02 DIAGNOSIS — M1990 Unspecified osteoarthritis, unspecified site: Secondary | ICD-10-CM | POA: Diagnosis not present

## 2015-03-02 DIAGNOSIS — R1013 Epigastric pain: Principal | ICD-10-CM | POA: Diagnosis present

## 2015-03-02 DIAGNOSIS — R079 Chest pain, unspecified: Secondary | ICD-10-CM | POA: Insufficient documentation

## 2015-03-02 DIAGNOSIS — I208 Other forms of angina pectoris: Secondary | ICD-10-CM

## 2015-03-02 DIAGNOSIS — Z9119 Patient's noncompliance with other medical treatment and regimen: Secondary | ICD-10-CM

## 2015-03-02 DIAGNOSIS — I255 Ischemic cardiomyopathy: Secondary | ICD-10-CM | POA: Diagnosis present

## 2015-03-02 DIAGNOSIS — K279 Peptic ulcer, site unspecified, unspecified as acute or chronic, without hemorrhage or perforation: Secondary | ICD-10-CM | POA: Diagnosis not present

## 2015-03-02 DIAGNOSIS — Z91199 Patient's noncompliance with other medical treatment and regimen due to unspecified reason: Secondary | ICD-10-CM

## 2015-03-02 DIAGNOSIS — A0472 Enterocolitis due to Clostridium difficile, not specified as recurrent: Secondary | ICD-10-CM

## 2015-03-02 DIAGNOSIS — I2510 Atherosclerotic heart disease of native coronary artery without angina pectoris: Secondary | ICD-10-CM | POA: Diagnosis not present

## 2015-03-02 DIAGNOSIS — R0789 Other chest pain: Secondary | ICD-10-CM | POA: Diagnosis not present

## 2015-03-02 DIAGNOSIS — I679 Cerebrovascular disease, unspecified: Secondary | ICD-10-CM

## 2015-03-02 DIAGNOSIS — R072 Precordial pain: Secondary | ICD-10-CM

## 2015-03-02 DIAGNOSIS — IMO0001 Reserved for inherently not codable concepts without codable children: Secondary | ICD-10-CM

## 2015-03-02 DIAGNOSIS — I200 Unstable angina: Secondary | ICD-10-CM

## 2015-03-02 HISTORY — DX: Hypothyroidism, unspecified: E03.9

## 2015-03-02 LAB — URINALYSIS, ROUTINE W REFLEX MICROSCOPIC
BILIRUBIN URINE: NEGATIVE
GLUCOSE, UA: 250 mg/dL — AB
HGB URINE DIPSTICK: NEGATIVE
Ketones, ur: NEGATIVE mg/dL
Leukocytes, UA: NEGATIVE
Nitrite: NEGATIVE
PROTEIN: NEGATIVE mg/dL
SPECIFIC GRAVITY, URINE: 1.015 (ref 1.005–1.030)
UROBILINOGEN UA: 0.2 mg/dL (ref 0.0–1.0)
pH: 8 (ref 5.0–8.0)

## 2015-03-02 LAB — COMPREHENSIVE METABOLIC PANEL
ALT: 24 U/L (ref 17–63)
ANION GAP: 9 (ref 5–15)
AST: 25 U/L (ref 15–41)
Albumin: 3.7 g/dL (ref 3.5–5.0)
Alkaline Phosphatase: 79 U/L (ref 38–126)
BILIRUBIN TOTAL: 0.6 mg/dL (ref 0.3–1.2)
BUN: 13 mg/dL (ref 6–20)
CO2: 22 mmol/L (ref 22–32)
Calcium: 9 mg/dL (ref 8.9–10.3)
Chloride: 102 mmol/L (ref 101–111)
Creatinine, Ser: 0.85 mg/dL (ref 0.61–1.24)
GFR calc Af Amer: >60 mL/min (ref >60)
Glucose, Bld: 265 mg/dL — ABNORMAL HIGH (ref 65–99)
POTASSIUM: 4.2 mmol/L (ref 3.5–5.1)
Sodium: 133 mmol/L — ABNORMAL LOW (ref 135–145)
TOTAL PROTEIN: 6.4 g/dL — AB (ref 6.5–8.1)

## 2015-03-02 LAB — CBC WITH DIFFERENTIAL/PLATELET
Basophils Absolute: 0 10*3/uL (ref 0.0–0.1)
Basophils Relative: 0 % (ref 0–1)
Eosinophils Absolute: 0.2 10*3/uL (ref 0.0–0.7)
Eosinophils Relative: 3 % (ref 0–5)
HEMATOCRIT: 40.3 % (ref 39.0–52.0)
Hemoglobin: 13.3 g/dL (ref 13.0–17.0)
LYMPHS PCT: 16 % (ref 12–46)
Lymphs Abs: 1.2 10*3/uL (ref 0.7–4.0)
MCH: 31 pg (ref 26.0–34.0)
MCHC: 33 g/dL (ref 30.0–36.0)
MCV: 93.9 fL (ref 78.0–100.0)
MONO ABS: 0.6 10*3/uL (ref 0.1–1.0)
MONOS PCT: 8 % (ref 3–12)
NEUTROS ABS: 5.2 10*3/uL (ref 1.7–7.7)
Neutrophils Relative %: 73 % (ref 43–77)
Platelets: 163 10*3/uL (ref 150–400)
RBC: 4.29 MIL/uL (ref 4.22–5.81)
RDW: 12.5 % (ref 11.5–15.5)
WBC: 7.2 10*3/uL (ref 4.0–10.5)

## 2015-03-02 LAB — TSH: TSH: 0.372 u[IU]/mL (ref 0.350–4.500)

## 2015-03-02 LAB — GLUCOSE, CAPILLARY
GLUCOSE-CAPILLARY: 297 mg/dL — AB (ref 65–99)
GLUCOSE-CAPILLARY: 193 mg/dL — AB (ref 65–99)

## 2015-03-02 LAB — TROPONIN I
Troponin I: <0.03 ng/mL (ref <0.031)
Troponin I: <0.03 ng/mL (ref <0.031)

## 2015-03-02 LAB — T4, FREE: FREE T4: 0.91 ng/dL (ref 0.61–1.12)

## 2015-03-02 LAB — TYPE AND SCREEN
ABO/RH(D): A POS
Antibody Screen: NEGATIVE

## 2015-03-02 LAB — LIPASE, BLOOD: LIPASE: 17 U/L — AB (ref 22–51)

## 2015-03-02 MED ORDER — DIPHENOXYLATE-ATROPINE 2.5-0.025 MG PO TABS: 1.0000 | ORAL_TABLET | Freq: Four times a day (QID) | ORAL | Status: DC | PRN

## 2015-03-02 MED ORDER — MORPHINE SULFATE (PF) 2 MG/ML IV SOLN: 1.0000 mg | INTRAVENOUS | Status: DC | PRN

## 2015-03-02 MED ORDER — CARVEDILOL 3.125 MG PO TABS
3.1250 mg | ORAL_TABLET | Freq: Two times a day (BID) | ORAL | Status: DC
  Administered 2015-03-02 – 2015-03-03 (×2): 3.125 mg via ORAL
  Filled 2015-03-02 (×2): qty 1

## 2015-03-02 MED ORDER — IOHEXOL 300 MG/ML  SOLN
25.0000 mL | Freq: Once | INTRAMUSCULAR | Status: AC | PRN
  Administered 2015-03-02: 25 mL via ORAL

## 2015-03-02 MED ORDER — ONDANSETRON HCL 4 MG PO TABS: 4.0000 mg | ORAL_TABLET | Freq: Four times a day (QID) | ORAL | Status: DC | PRN

## 2015-03-02 MED ORDER — PANTOPRAZOLE SODIUM 40 MG IV SOLR
40.0000 mg | Freq: Once | INTRAVENOUS | Status: AC
  Administered 2015-03-02: 40 mg via INTRAVENOUS
  Filled 2015-03-02: qty 40

## 2015-03-02 MED ORDER — ALBUTEROL SULFATE (2.5 MG/3ML) 0.083% IN NEBU: 2.5000 mg | INHALATION_SOLUTION | Freq: Four times a day (QID) | RESPIRATORY_TRACT | Status: DC | PRN

## 2015-03-02 MED ORDER — INSULIN ASPART 100 UNIT/ML ~~LOC~~ SOLN: 0.0000 [IU] | Freq: Every day | SUBCUTANEOUS | Status: DC

## 2015-03-02 MED ORDER — SIMETHICONE 80 MG PO CHEW: 80.0000 mg | CHEWABLE_TABLET | Freq: Four times a day (QID) | ORAL | Status: DC | PRN

## 2015-03-02 MED ORDER — ALUM & MAG HYDROXIDE-SIMETH 200-200-20 MG/5ML PO SUSP: 30.0000 mL | Freq: Four times a day (QID) | ORAL | Status: DC | PRN

## 2015-03-02 MED ORDER — TAMSULOSIN HCL 0.4 MG PO CAPS
0.4000 mg | ORAL_CAPSULE | Freq: Every day | ORAL | Status: DC
  Administered 2015-03-02: 0.4 mg via ORAL
  Filled 2015-03-02: qty 1

## 2015-03-02 MED ORDER — PANTOPRAZOLE SODIUM 40 MG PO TBEC
40.0000 mg | DELAYED_RELEASE_TABLET | Freq: Every day | ORAL | Status: DC
  Administered 2015-03-03: 40 mg via ORAL
  Filled 2015-03-02 (×2): qty 1

## 2015-03-02 MED ORDER — ONDANSETRON HCL 4 MG/2ML IJ SOLN: 4.0000 mg | Freq: Four times a day (QID) | INTRAMUSCULAR | Status: DC | PRN

## 2015-03-02 MED ORDER — ACETAMINOPHEN 650 MG RE SUPP: 650.0000 mg | Freq: Four times a day (QID) | RECTAL | Status: DC | PRN

## 2015-03-02 MED ORDER — ENOXAPARIN SODIUM 40 MG/0.4ML ~~LOC~~ SOLN
40.0000 mg | SUBCUTANEOUS | Status: DC
  Administered 2015-03-02: 40 mg via SUBCUTANEOUS
  Filled 2015-03-02: qty 0.4

## 2015-03-02 MED ORDER — NITROGLYCERIN 0.4 MG SL SUBL: 0.4000 mg | SUBLINGUAL_TABLET | SUBLINGUAL | Status: DC | PRN

## 2015-03-02 MED ORDER — ASPIRIN EC 81 MG PO TBEC
81.0000 mg | DELAYED_RELEASE_TABLET | Freq: Every day | ORAL | Status: DC
  Administered 2015-03-03: 81 mg via ORAL
  Filled 2015-03-02 (×2): qty 1

## 2015-03-02 MED ORDER — IOHEXOL 300 MG/ML  SOLN
100.0000 mL | Freq: Once | INTRAMUSCULAR | Status: AC | PRN
  Administered 2015-03-02: 100 mL via INTRAVENOUS

## 2015-03-02 MED ORDER — ATORVASTATIN CALCIUM 40 MG PO TABS
40.0000 mg | ORAL_TABLET | Freq: Every day | ORAL | Status: DC
  Administered 2015-03-02: 40 mg via ORAL
  Filled 2015-03-02: qty 1

## 2015-03-02 MED ORDER — POTASSIUM CHLORIDE IN NACL 20-0.9 MEQ/L-% IV SOLN
INTRAVENOUS | Status: DC
  Administered 2015-03-02: 1000 mL via INTRAVENOUS

## 2015-03-02 MED ORDER — FAMOTIDINE 20 MG PO TABS
20.0000 mg | ORAL_TABLET | Freq: Every day | ORAL | Status: DC
  Administered 2015-03-02: 20 mg via ORAL
  Filled 2015-03-02: qty 1

## 2015-03-02 MED ORDER — TICAGRELOR 90 MG PO TABS
90.0000 mg | ORAL_TABLET | Freq: Two times a day (BID) | ORAL | Status: DC
  Administered 2015-03-02 – 2015-03-03 (×2): 90 mg via ORAL
  Filled 2015-03-02 (×6): qty 1

## 2015-03-02 MED ORDER — LEVALBUTEROL HCL 0.63 MG/3ML IN NEBU: 0.6300 mg | INHALATION_SOLUTION | Freq: Four times a day (QID) | RESPIRATORY_TRACT | Status: DC | PRN

## 2015-03-02 MED ORDER — ISOSORBIDE MONONITRATE ER 60 MG PO TB24
30.0000 mg | ORAL_TABLET | Freq: Every day | ORAL | Status: DC
  Administered 2015-03-03: 30 mg via ORAL
  Filled 2015-03-02 (×2): qty 1

## 2015-03-02 MED ORDER — INSULIN ASPART 100 UNIT/ML ~~LOC~~ SOLN
0.0000 [IU] | Freq: Three times a day (TID) | SUBCUTANEOUS | Status: DC
  Administered 2015-03-02: 8 [IU] via SUBCUTANEOUS
  Administered 2015-03-03: 3 [IU] via SUBCUTANEOUS

## 2015-03-02 MED ORDER — GUAIFENESIN-DM 100-10 MG/5ML PO SYRP: 5.0000 mL | ORAL_SOLUTION | ORAL | Status: DC | PRN

## 2015-03-02 MED ORDER — ACETAMINOPHEN 325 MG PO TABS: 650.0000 mg | ORAL_TABLET | Freq: Four times a day (QID) | ORAL | Status: DC | PRN

## 2015-03-02 MED ORDER — LEVOTHYROXINE SODIUM 50 MCG PO TABS
50.0000 ug | ORAL_TABLET | Freq: Every day | ORAL | Status: DC
  Administered 2015-03-03: 50 ug via ORAL
  Filled 2015-03-02: qty 1

## 2015-03-02 MED ORDER — ADULT MULTIVITAMIN W/MINERALS CH
1.0000 | ORAL_TABLET | Freq: Every day | ORAL | Status: DC
  Administered 2015-03-03: 1 via ORAL
  Filled 2015-03-02: qty 1

## 2015-03-02 NOTE — ED Notes (Signed)
Pt c/o upper abd pain radiating to chest with nausea this am.

## 2015-03-02 NOTE — ED Provider Notes (Signed)
CSN: [id number]     Arrival date & time 03/02/15  3299 History  This chart was scribe for Jeffrey Essex, MD by Jeffrey [name], ED Scribe. The patient was seen in room APA04/APA04 and the patient's care was started at 8:43 AM.    Chief Complaint  Patient presents with  . Abdominal Pain   The history is provided by the patient. No language interpreter was used.   HPI Comments: Jeffrey [name] is a 81 y.o. male with a hx of heart stents and possible ulcers who presents to the Emergency Department complaining of non-radiating intermittent mid CP and upper abdominal pain onset this morning at 7 am. He explains the abdominal pain as "gassy" and that the CP lasts for a few seconds and they are similar to the pain when he had heart issues.  He reports mild SOB but adds that he uses an inhaler periodically. He denies N//V/D. He also denies blood in the stool. Pt reports that he took Pepto Bismol PTA and usually takes it when. He states that he took his medication this morning.   Past Medical History  Diagnosis Date  . Diabetes mellitus, type II   . Hyperlipidemia   . Coronary atherosclerosis of native coronary artery     a. CABG x 4 in 1989 (VG->OM1->OM2, VG->RCA, LIMA->LAD), b. 05/2010: DES to VG-OM1/OM2, DES to distal LCx. c. NSTEMI in 04/2011 - TO distal LCX stent and VG->OM2. d. 02/2012 NSTEMI DES to VG-OM1/continuation to OM2 occluded. e. inferior STEMI s/p DES to SVG-RAMUS 06/2012. f. inferolat STEMI 09/2012 s/p DES to SVG-interm; g. Lex MV (11/14):  EF 35%, inf-lat scar with small peri-infarct ischemia  . Essential hypertension, benign   . Osteoarthritis   . History of stroke   . History of pneumonia   . Cervical vertebral fracture   . Chronic back pain   . Benign prostatic hypertrophy     History of urinary retention  . Peptic ulcer disease   . Gastroesophageal reflux disease   . Ischemic cardiomyopathy Nov 2015    EF 35% cath, 45-50% by echo   Past Surgical History  Procedure  Laterality Date  . Tonsillectomy    . Coronary artery bypass graft  1989  . Coronary angioplasty  10/12, 8/13, 12/13, 3/14    SVG-OM PCI  . Cardiac catheterization  05/19/14    SVG-OM occl- medical Rx  . Left heart catheterization with coronary angiogram N/A 11/01/2011    Procedure: LEFT HEART CATHETERIZATION WITH CORONARY ANGIOGRAM;  Surgeon: Lorretta Harp, MD;  Location: [facility] CATH LAB;  Service: Cardiovascular;  Laterality: N/A;  . Percutaneous coronary stent intervention (pci-s) N/A 11/01/2011    Procedure: PERCUTANEOUS CORONARY STENT INTERVENTION (PCI-S);  Surgeon: Lorretta Harp, MD;  Location: [facility] CATH LAB;  Service: Cardiovascular;  Laterality: N/A;  . Left heart catheterization with coronary/graft angiogram N/A 03/10/2012    Procedure: LEFT HEART CATHETERIZATION WITH [name];  Surgeon: Sherren Mocha, MD;  Location: [facility] CATH LAB;  Service: Cardiovascular;  Laterality: N/A;  . Left heart catheterization with coronary angiogram N/A 06/18/2012    Procedure: LEFT HEART CATHETERIZATION WITH CORONARY ANGIOGRAM;  Surgeon: Peter M Martinique, MD;  Location: [facility] CATH LAB;  Service: Cardiovascular;  Laterality: N/A;  . Percutaneous coronary stent intervention (pci-s)  06/18/2012    Procedure: PERCUTANEOUS CORONARY STENT INTERVENTION (PCI-S);  Surgeon: Peter M Martinique, MD;  Location: [facility] CATH LAB;  Service: Cardiovascular;;  . Left heart catheterization with coronary/graft angiogram  10/06/2012    Procedure:  LEFT HEART CATHETERIZATION WITH [name];  Surgeon: Burnell Blanks, MD;  Location: [facility] CATH LAB;  Service: Cardiovascular;;  . Percutaneous coronary stent intervention (pci-s)  10/06/2012    Procedure: PERCUTANEOUS CORONARY STENT INTERVENTION (PCI-S);  Surgeon: Burnell Blanks, MD;  Location: [facility] CATH LAB;  Service: Cardiovascular;;  . Left heart catheterization with coronary/graft angiogram N/A 11/09/2012    Procedure: LEFT HEART CATHETERIZATION WITH  [name];  Surgeon: Peter M Martinique, MD;  Location: [facility] CATH LAB;  Service: Cardiovascular;  Laterality: N/A;  . Left heart catheterization with coronary/graft angiogram N/A 05/19/2014    Procedure: LEFT HEART CATHETERIZATION WITH [name];  Surgeon: Troy Sine, MD;  Location: [facility] CATH LAB;  Service: Cardiovascular;  Laterality: N/A;   Family History  Problem Relation Age of Onset  . Early death      Parents died young  . Appendicitis Mother     Pt was 47 year old  . Heart attack Father 71   Social History  Substance Use Topics  . Smoking status: Former Smoker -- 2.00 packs/day for 10 years    Types: Cigarettes    Start date: 07/14/1950    Quit date: 07/14/1961  . Smokeless tobacco: Current User    Types: Chew  . Alcohol Use: No    Review of Systems  Constitutional: Negative for fever and chills.  Respiratory: Positive for shortness of breath.   Cardiovascular: Positive for chest pain.  Gastrointestinal: Positive for abdominal pain. Negative for nausea, vomiting, diarrhea and blood in stool.  All other systems reviewed and are negative.     Allergies  Review of patient's allergies indicates no known allergies.  Home Medications   Prior to Admission medications   Medication Sig Start Date End Date Taking? Authorizing Provider  acetaminophen (TYLENOL) 325 MG tablet Take 2 tablets (650 mg total) by mouth every 4 (four) hours as needed for headache or mild pain. 05/21/14  Yes Luke K Kilroy, PA-C  albuterol (PROVENTIL HFA;VENTOLIN HFA) 108 (90 BASE) MCG/ACT inhaler Inhale 2 puffs into the lungs every 6 (six) hours as needed for wheezing or shortness of breath.   Yes Historical Provider, MD  aspirin EC 81 MG tablet Take 81 mg by mouth daily.   Yes Historical Provider, MD  atorvastatin (LIPITOR) 40 MG tablet Take 1 tablet (40 mg total) by mouth daily at 6 PM. 05/29/13  Yes Scott T Kathlen Mody, PA-C  bismuth subsalicylate (PEPTO BISMOL) 262 MG/15ML  suspension Take 30 mLs by mouth every 6 (six) hours as needed for indigestion or diarrhea or loose stools.    Yes Historical Provider, MD  BRILINTA 90 MG TABS tablet TAKE 1 TABLET BY MOUTH TWICE DAILY. 01/16/15  Yes Herminio Commons, MD  carvedilol (COREG) 3.125 MG tablet TAKE 1 TABLET BY MOUTH TWICE DAILY WITH MEALS. (HEART RATE & BLOOD PRESSURE) 10/06/14  Yes Herminio Commons, MD  diphenoxylate-atropine (LOMOTIL) 2.5-0.025 MG per tablet Take 1 tablet by mouth 4 (four) times daily as needed for diarrhea or loose stools. 01/07/15  Yes Tanna Furry, MD  furosemide (LASIX) 20 MG tablet Take 20 mg by mouth as needed for fluid. Take 1 tablet as needed if 3 lb weight gain. 05/06/13  Yes Lendon Colonel, NP  isosorbide mononitrate (IMDUR) 30 MG 24 hr tablet TAKE 1 TABLET BY MOUTH ONCE DAILY. 09/07/14  Yes Herminio Commons, MD  levothyroxine (SYNTHROID, LEVOTHROID) 50 MCG tablet Take 50 mcg by mouth daily before breakfast.  09/08/14  Yes Historical Provider,  MD  metFORMIN (GLUCOPHAGE) 500 MG tablet Take 2 tablets (1,000 mg total) by mouth 2 (two) times daily with a meal. 05/22/14  Yes Erlene Quan, PA-C  Multiple Vitamin (MULTIVITAMIN WITH MINERALS) TABS Take 1 tablet by mouth daily.   Yes Historical Provider, MD  nitroGLYCERIN (NITROSTAT) 0.4 MG SL tablet Place 0.4 mg under the tongue every 5 (five) minutes as needed for chest pain.   Yes Historical Provider, MD  pantoprazole (PROTONIX) 40 MG tablet Take 40 mg by mouth daily.   Yes Historical Provider, MD  potassium chloride (K-DUR) 10 MEQ tablet Take 10 mEq by mouth as needed. Takes along with the Furosemide when he has swelling.   Yes Historical Provider, MD  simethicone (MYLICON) 80 MG chewable tablet Chew 80 mg by mouth every 6 (six) hours as needed for flatulence.   Yes Historical Provider, MD  tamsulosin (FLOMAX) 0.4 MG CAPS Take 0.4 mg by mouth daily after supper.  01/27/13  Yes Historical Provider, MD  HYDROmorphone (DILAUDID) 4 MG tablet Take 1  tablet (4 mg total) by mouth every 4 (four) hours as needed for severe pain. Patient not taking: Reported on 12/16/2014 12/13/14   Milton Ferguson, MD  metroNIDAZOLE (FLAGYL) 500 MG tablet Take 1 tablet (500 mg total) by mouth 2 (two) times daily. Patient not taking: Reported on 02/25/2015 01/07/15   Tanna Furry, MD  oxyCODONE-acetaminophen (PERCOCET) 5-325 MG per tablet Take 1 tablet by mouth every 4 (four) hours as needed for moderate pain. Patient not taking: Reported on 12/16/2014 6/76/72   Delora Fuel, MD   BP 094/70 mmHg  Pulse 64  Temp(Src) 98.5 F (36.9 C) (Oral)  Resp 18  Ht 5\' 10"  (1.778 m)  Wt 170 lb (77.111 kg)  BMI 24.39 kg/m2  SpO2 97% Physical Exam  Constitutional: He is oriented to person, place, and time. He appears well-developed and well-nourished. No distress.  HENT:  Head: Normocephalic and atraumatic.  Mouth/Throat: Oropharynx is clear and moist. No oropharyngeal exudate.  Eyes: Conjunctivae and EOM are normal. Pupils are equal, round, and reactive to light.  Neck: Normal range of motion. Neck supple.  No meningismus.  Cardiovascular: Normal rate, regular rhythm, normal heart sounds and intact distal pulses.   No murmur heard. Pulmonary/Chest: Effort normal and breath sounds normal. No respiratory distress. He exhibits no tenderness.  Abdominal: Soft. There is no tenderness. There is no rebound and no guarding.  No RUQ tenderness Mild epigastric tenderness  Musculoskeletal: Normal range of motion. He exhibits no edema or tenderness.  Neurological: He is alert and oriented to person, place, and time. No cranial nerve deficit. He exhibits normal muscle tone. Coordination normal.  No ataxia on finger to nose bilaterally. No pronator drift. 5/5 strength throughout. CN 2-12 intact. . Equal grip strength. Sensation intact.  Skin: Skin is warm.  Psychiatric: He has a normal mood and affect. His behavior is normal.  Nursing note and vitals reviewed.   ED Course  Procedures  (including critical care time) DIAGNOSTIC STUDIES: Oxygen Saturation is 99% on RA, normal by my interpretation.    COORDINATION OF CARE: 8:53 AM- Pt advised of plan for treatment and pt agrees.   Labs Review Labs Reviewed  COMPREHENSIVE METABOLIC PANEL - Abnormal; Notable for the following:    Sodium 133 (*)    Glucose, Bld 265 (*)    Total Protein 6.4 (*)    All other components within normal limits  LIPASE, BLOOD - Abnormal; Notable for the following:  Lipase 17 (*)    All other components within normal limits  URINALYSIS, ROUTINE W REFLEX MICROSCOPIC (NOT AT [facility]) - Abnormal; Notable for the following:    Glucose, UA 250 (*)    All other components within normal limits  CBC WITH DIFFERENTIAL/PLATELET  TROPONIN I  TROPONIN I  POC OCCULT BLOOD, ED  TYPE AND SCREEN    Imaging Review Dg Chest 2 View  03/02/2015   CLINICAL DATA:  Chest pain  EXAM: CHEST  2 VIEW  COMPARISON:  12/13/2014  FINDINGS: Normal heart size. Clear lungs. No pneumothorax or pleural effusion. Stable thoracic spine.  IMPRESSION: No active cardiopulmonary disease.   Electronically Signed   By: Marybelle Killings M.D.   On: 03/02/2015 10:16   Ct Abdomen Pelvis W Contrast  03/02/2015   CLINICAL DATA:  Mid abdominal pain began this morning.  EXAM: CT ABDOMEN AND PELVIS WITH CONTRAST  TECHNIQUE: Multidetector CT imaging of the abdomen and pelvis was performed using the standard protocol following bolus administration of intravenous contrast.  CONTRAST:  25mL OMNIPAQUE IOHEXOL 300 MG/ML SOLN, 174mL OMNIPAQUE IOHEXOL 300 MG/ML SOLN  COMPARISON:  None.  FINDINGS: Lower chest: The lung bases are clear except for dependent subpleural atelectasis. The heart is normal in size. No pericardial effusion. The distal esophagus is grossly normal. There are remote appearing incompletely healed lower left rib fractures involving the eighth, ninth and eleventh ribs.  Hepatobiliary: Mild fatty infiltration of the liver but no focal hepatic  lesions or intrahepatic biliary dilatation. The gallbladder appears normal. No common bile duct dilatation.  Pancreas: No mass, inflammation or ductal dilatation. Small duodenum diverticulum noted near the pancreatic head.  Spleen: Normal size.  No focal lesions.  Adrenals/Urinary Tract: The adrenal glands and kidneys are unremarkable. No renal calculi or mass. No hydronephrosis. No ureteral or bladder calculi. Mild diffuse bladder wall thickening and enlarged prostate gland.  Stomach/Bowel: The stomach, duodenum, small bowel and colon are unremarkable. No inflammatory changes, mass lesions or obstructive findings. The terminal ileum is normal. The appendix is normal. Sigmoid diverticulosis without findings for acute diverticulitis.  Vascular/Lymphatic: Advanced atherosclerotic calcifications involving the aorta and branch vessel ostia. The branch vessels are patent. The major venous structures are patent. Small scattered mesenteric and retroperitoneal lymph nodes but no mass or adenopathy.  Other: Markedly enlarged prostate gland with median lobe hypertrophy impressing on the base of the bladder. Diffuse bladder wall thickening likely due to partial bladder outlet obstruction. No pelvic mass or adenopathy. No free pelvic fluid collections. No inguinal mass or adenopathy.  Musculoskeletal: No significant bony findings. There are degenerative changes involving the spine.  IMPRESSION: 1. No acute abdominal/pelvic findings, mass lesions or lymphadenopathy. 2. Remote ununited lower left rib fractures. 3. Advanced atherosclerotic calcifications involving the abdominal aorta but no aneurysm or dissection. 4. Sigmoid diverticulosis without findings for acute diverticulitis. 5. Enlarged prostate gland with median lobe hypertrophy impressing on the base of the bladder and likely causing partial bladder outlet obstruction.   Electronically Signed   By: Marijo Sanes M.D.   On: 03/02/2015 11:17   I have personally reviewed  and evaluated these images and lab results as part of my medical decision-making.   EKG Interpretation   Date/Time:  Friday March 02 2015 08:40:06 EDT Ventricular Rate:  85 PR Interval:  178 QRS Duration: 110 QT Interval:  390 QTC Calculation: 464 R Axis:   98 Text Interpretation:  Sinus rhythm Inferior infarct, age indeterminate  Lateral leads are also involved Lateral  ST depressions slightly worse  Confirmed by Wyvonnia Dusky  MD, Shirely Toren [phone]) on 03/02/2015 8:57:22 AM      MDM   Final diagnoses:  Chest pain, unspecified chest pain type  Epigastric pain   Patient poor historian. Reports upper abdominal pain since 5 AM rating to the chest with nausea. Pain is coming and going lasting a few seconds at a time. Reports "gas pain" but also has "heart pain". Denies chest pain currently.  Complex cardiac history with bypass and multiple stents. Last catheterization in November reviewed. Cardiac catheterization on November 6 revealed severe multivessel CAD, patent LIMA to LAD with associated collateralization to the distal RCA and obtuse marginal system, occluded SVG to the OM1 and OM 2, and occluded SVG to the distal RCA. Medical therapy was recommended that time  Labs reassuring.  Hyperglycemia without DKA. Troponin negative.  CT shows no auscultation for patient's abdominal pain and no acute pathology. Cardiology has seen patient and recommends overnight admission for serial enzymes and EKGs. Initial troponin is negative.  Patient's pain is likely GI in origin but he has significant cardiovascular risk factors and known CAD.  Troponin negative 2. Dr. Harl Bowie has seen patient and recommends admission for cardiac rule out. His chest and abdominal pain have resolved in the ED.  I personally performed the services described in this documentation, which was scribed in my presence. The recorded information has been reviewed and is accurate.   Jeffrey Essex, MD 03/02/15 1425

## 2015-03-02 NOTE — Progress Notes (Signed)
Pt is diabetic on metformin.  Creat .85, GFR > 60. Pt not taken off metformin per new protocol./bbj

## 2015-03-02 NOTE — Consult Note (Addendum)
CARDIOLOGY CONSULT NOTE   Patient ID: Jeffrey [name] MRN: [id number] DOB/AGE: [birth year] 81 y.o.  Admit Date: 03/02/2015 Referring Physician:ER-[facility] Primary Physician: Jeffrey Herring., MD Consulting Cardiologist: Jeffrey Dolly MD Primary Cardiologist: Jeffrey Sable MD Reason for Consultation: Chest Pain with abdominal pain  Clinical Summary Jeffrey [name] is a 81 y.o.male of CAD, with hx of CABG. He was last seen in the office on5/26/2016 and was without complaints. He was continued on DAPT, BB, and statin. Marland Kitchen He has significant anxiety issues and often comes to ER 3 times since 12/2014). He has been having some social issues as well and speaks about them at length. He has bene taking extra doses of lisinopril when his BP goes up resulting in very low BP in the am. He states that he gets upset with his girlfriend and is often hypertensive with this. He has refused counseling or psychiatric evaluations.   He states that he has chronic abdominal and chest pain. He eats a lot of fast food and canned pork and beans, pre-made egg salad and ham salad. He states that he has felt dehydrated this week and has been drinking a lot of fluids, Gator Aiid. He states that the chest pain and abdominal pain are the same as they have always been. Every once and a while he has a "faint pain" lasting a few seconds that reminds him of his heart pain.   He was in ER 4 days ago because of dehydration and hypotension. He received IV fluids. He does not take lasix daily, only prn.  He took a large amount of Pepto Bismol before coming to ER. He states he takes a lot of Pepto Bismol for chronic abdominal gas pain. He ate pork and beans and egg salad last night. It usually causes this type of pain. He states he gets nervous and decided to be seen in ER for recurrent pain.   On arrival to ER, BP 123/76, HR 82, O2 Sat 98%, afebrile.  Na 133, Glucose 265,, creatinine 0.85 Lipase 17, troponin <0.03. EKG, NSR  with ST-T wave abnormalities noted infero/lateral, with inversion inferior leads not new, and depression laterally which appears worsened.    No Known Allergies  Medications Scheduled Medications:    Infusions:    PRN Medications:     Past Medical History  Diagnosis Date  . Diabetes mellitus, type II   . Hyperlipidemia   . Coronary atherosclerosis of native coronary artery     a. CABG x 4 in 1989 (VG->OM1->OM2, VG->RCA, LIMA->LAD), b. 05/2010: DES to VG-OM1/OM2, DES to distal LCx. c. NSTEMI in 04/2011 - TO distal LCX stent and VG->OM2. d. 02/2012 NSTEMI DES to VG-OM1/continuation to OM2 occluded. e. inferior STEMI s/p DES to SVG-RAMUS 06/2012. f. inferolat STEMI 09/2012 s/p DES to SVG-interm; g. Lex MV (11/14):  EF 35%, inf-lat scar with small peri-infarct ischemia  . Essential hypertension, benign   . Osteoarthritis   . History of stroke   . History of pneumonia   . Cervical vertebral fracture   . Chronic back pain   . Benign prostatic hypertrophy     History of urinary retention  . Peptic ulcer disease   . Gastroesophageal reflux disease   . Ischemic cardiomyopathy Nov 2015    EF 35% cath, 45-50% by echo    Past Surgical History  Procedure Laterality Date  . Tonsillectomy    . Coronary artery bypass graft  1989  . Coronary angioplasty  10/12, 8/13, 12/13, 3/14  SVG-OM PCI  . Cardiac catheterization  05/19/14    SVG-OM occl- medical Rx  . Left heart catheterization with coronary angiogram N/A 11/01/2011    Procedure: LEFT HEART CATHETERIZATION WITH CORONARY ANGIOGRAM;  Surgeon: Jeffrey Harp, MD;  [name];  Service: Cardiovascular;  Laterality: N/A;  . Percutaneous coronary stent intervention (pci-s) N/A 11/01/2011    Procedure: PERCUTANEOUS CORONARY STENT INTERVENTION (PCI-S);  Surgeon: Jeffrey Harp, MD;  [name];  Service: Cardiovascular;  Laterality: N/A;  . Left heart catheterization with coronary/graft angiogram N/A 03/10/2012     Procedure: LEFT HEART CATHETERIZATION WITH [name];  Surgeon: Jeffrey Mocha, MD;  [name];  Service: Cardiovascular;  Laterality: N/A;  . Left heart catheterization with coronary angiogram N/A 06/18/2012    Procedure: LEFT HEART CATHETERIZATION WITH CORONARY ANGIOGRAM;  Surgeon: Jeffrey M Martinique, MD;  [name];  Service: Cardiovascular;  Laterality: N/A;  . Percutaneous coronary stent intervention (pci-s)  06/18/2012    Procedure: PERCUTANEOUS CORONARY STENT INTERVENTION (PCI-S);  Surgeon: Jeffrey M Martinique, MD;  [name];  Service: Cardiovascular;;  . Left heart catheterization with coronary/graft angiogram  10/06/2012    Procedure: LEFT HEART CATHETERIZATION WITH [name];  Surgeon: Jeffrey Blanks, MD;  [name];  Service: Cardiovascular;;  . Percutaneous coronary stent intervention (pci-s)  10/06/2012    Procedure: PERCUTANEOUS CORONARY STENT INTERVENTION (PCI-S);  Surgeon: Jeffrey Blanks, MD;  [name];  Service: Cardiovascular;;  . Left heart catheterization with coronary/graft angiogram N/A 11/09/2012    Procedure: LEFT HEART CATHETERIZATION WITH [name];  Surgeon: Jeffrey M Martinique, MD;  [name];  Service: Cardiovascular;  Laterality: N/A;  . Left heart catheterization with coronary/graft angiogram N/A 05/19/2014    Procedure: LEFT HEART CATHETERIZATION WITH [name];  Surgeon: Jeffrey Sine, MD;  [facility]: [name];  Service: Cardiovascular;  Laterality: N/A;    Family History  Problem Relation Age of Onset  . Early death      [name]  . Appendicitis Mother     Pt was 79 year old  . Heart attack Father 38    Social History Jeffrey [name] reports that he quit smoking about 53 years ago. His smoking use included Cigarettes. He started smoking about 64 years ago. He has a 20 pack-year smoking history. His smokeless tobacco  use includes Chew. Jeffrey [name] reports that he does not drink alcohol.  Review of Systems Complete review of systems are found to be negative unless outlined in H&P above.  Physical Examination Blood pressure 118/61, pulse 69, temperature 98.5 F (36.9 C), temperature source Oral, resp. rate 13, height 5\' 10"  (1.778 m), weight 170 lb (77.111 kg), SpO2 94 %. No intake or output data in the 24 hours ending 03/02/15 1025  Telemetry:  GEN: HEENT: Conjunctiva and lids normal, oropharynx clear with moist mucosa. Neck: Supple, no elevated JVP or carotid bruits, no thyromegaly. Lungs: Clear to auscultation, nonlabored breathing at rest. Cardiac: Regular rate and rhythm, no S3 or significant systolic murmur, no pericardial rub. Abdomen: Soft, nontender, no hepatomegaly, bowel sounds present, no guarding or rebound. Extremities: No pitting edema, distal pulses 2+. Skin: Warm and dry. Musculoskeletal: No kyphosis. Neuropsychiatric: Alert and oriented x3, affect grossly appropriate.  Prior Cardiac Testing/Procedures 1.Cardiac Cath 05/19/2014 Left main: The left main coronary artery bifurcated into the LAD and left circumflex vessel.  LAD: LAD had 90% stenosis immediately beyond the ostium.  The LAD was occluded after several septal perforating arteries. There was significant collateralization via the septal perforating arteries to the distal RCA. Left circumflex: there was a 99% very proximal stenosis. The OM marginal Danese Dorsainvil was occluded immediately below beyond this stenosis. The mid AV groove circumflex was occluded. Right coronary artery: diffusely diseased proximal vessel with total mid occlusion and evidence for bridging collaterals faintly applying more distal RCA. LIMA to LAD: widely patent and anastomosed into the mid LAD. There was good filling of a prominent diagonal vessel. Collaterals now supplying the obtuse marginal Nikayla Madaris up to a small portion of the AV groove circumflex and  collaterals are also present supplying the distal RCA. SVG to OM1/OM was now completely occluded ostially proximal to the remotely placed very proximal stent which had undergone numerous interventions. SVG to RCA is occluded and this is old.  Left ventriculography revealed moderately severe LV dysfunction with an ejection fraction of approximately 35%. There was a pronounced inferior hypocontractility and mild mid anterolateral hypocontractility  Echocardigram 05/19/2014 Left ventricle: The cavity size was normal. Wall thickness was normal. Systolic function was mildly reduced. The estimated ejection fraction was in the range of 45% to 50%. Diffuse hypokinesis. Doppler parameters are consistent with abnormal left ventricular relaxation (grade 1 diastolic dysfunction). - Mitral valve: Mildly calcified annulus. - Left atrium: The atrium was mildly dilated.  [name] Results  Basic Metabolic Panel:  Recent Labs [name] 02/25/15 1143 03/02/15 0900  NA 134* 133*  K 4.2 4.2  CL 103 102  CO2 22 22  GLUCOSE 277* 265*  BUN 13 13  CREATININE 0.89 0.85  CALCIUM 9.1 9.0    Liver Function Tests:  Recent Labs [name] 02/25/15 1143 03/02/15 0900  AST 25 25  ALT 23 24  ALKPHOS 81 79  BILITOT 0.6 0.6  PROT 6.6 6.4*  ALBUMIN 3.8 3.7    CBC:  Recent Labs [name] 02/25/15 1143 03/02/15 0900  WBC 7.7 7.2  NEUTROABS  --  5.2  HGB 13.3 13.3  HCT 39.4 40.3  MCV 93.6 93.9  PLT 134* 163    Cardiac Enzymes:  Recent Labs [name] 02/25/15 1143 03/02/15 0900  TROPONINI <0.03 <0.03    Radiology: Dg Chest 2 View  03/02/2015   CLINICAL DATA:  Chest pain  EXAM: CHEST  2 VIEW  COMPARISON:  12/13/2014  FINDINGS: Normal heart size. Clear lungs. No pneumothorax or pleural effusion. Stable thoracic spine.  IMPRESSION: No active cardiopulmonary disease.   Electronically Signed   By: Marybelle Killings M.D.   On: 03/02/2015 10:16     ECG: NSR with inferior T-wave inversion and ST depression  laterally. Rate of 85 bpm.    Impression and Recommendations  1.Chest Pain: Atypical, chronic, usually occuring everyday, after eating. Some "faint" heart pain. He does not take NTG for this, but takes Pepto Bismol almost daily. He eats a lot of gas producing foods. EKG is essentially unchanged with the exception of some ST depression in the lateral leads that seem more prominent but not new. Doubt cardiac etiology of pain as this is not new or severe. Would cycle cardiac enzymes X 2 more. If negative could be discharged tomorrow.   GI origin appears more likely. Will repeat echo for changes in EF. He states he feels better by coming to ER and is currently asymptomatic.   2. CAD: Lengthy hx as outline above. Most recent cardiac cath in 05/2014. Troponin is negative X 1. CXR negative for CHF or pneumonia. He is medically  compliant. He should continue on nitrates, carvedilol, Brilinta and ASA and statin.  3. Chronic Abdominal pain: Eats a lot of gas producing foods and fast foods. Has hx of PUD. He denies bleeding in his stool or hemoptysis. May need to consider GI evaluation at OP.   4. Diabetes: Takes metformin. Does not watch his diet. Can consider diabetic gastroparesis as possible cause of symptoms. Will defer to PCP. BG was elevated on arrival to ER.  5. Mild dementia with anxiety: Long standing.   Signed: Phill Myron. Lawrence NP [place]  03/02/2015, 10:25 AM Co-Sign MD   Patient seen and discussed wit NP Purcell Nails, I agree with her documentation above. 81 yo male history of CAD with prior CABG in 1989 (VG->OM1->OM2, VG->RCA, LIMA->LAD) with subsequent stenting as described in his PMH, DM2, HL, HTN, CVA admitted with epigastric pain.      Jan 2015 echo: LVEF 90-24%, grade I diastolic dysfunction 03/7352 cath: LVEF 35%, occluded LAD, occluded LCX, occluded RCA. Patent LIMA-LAD, occluded SVG to OM1/OM2, occluded SVG-RCA. Patient with collaterals to RCA and OM territories.  CXR no acute  process Trop neg x1, lipase 17, K 4.2, Cr 0.85, Hgb 13.3, Plt 163 EKG SR, LAE, ST depressions inferior and lateral precordial leads that are chronic.   Atypical symptoms for cardiac pain. No objective evidence for ischemia at this time, would continue to cycle enzymes and EKGs overnight. Will repeat echo. Would not anticipate ischemic testing unless objective evidence of ischemia.  Zandra Abts MD

## 2015-03-02 NOTE — H&P (Signed)
[facility] History and Physical  Jeffrey [name] PNT:[id number] DOB: Jan 07, 1929 DOA: 03/02/2015  Referring physician: ED physician, Dr. Wyvonnia Dusky PCP: [name]., MD  Primary cardiologist: Dr. Bronson Ing  Chief Complaint: Chest pain/epigastric abdominal pain.  HPI: Jeffrey [name] is a 81 y.o. male with a history of ischemic cardiomyopathy, CAD-status post CABG 4 in 1989; subsequent myocardial infarctions with stenting, type 2 diabetes mellitus, remote peptic ulcer disease, and hypothyroidism. Jeffrey [name] presents to the emergency department with a complaint of chest pain and epigastric abdominal pain. Of note, Jeffrey [name] presented to the ED 4 days ago because of dehydration and relative hypotension. Jeffrey [name] received IV fluids and was discharged back to home. Jeffrey [name] has intermittent abdominal pain which Jeffrey [name] describes as chronic, but over the past few days, Jeffrey [name] has had precordial chest pain and upper abdominal pain that worsened this morning. Jeffrey [name] had no associated radiation of the pain, diaphoresis, pleurisy, nausea, vomiting, or diarrhea. Jeffrey [name] did have some intermittent shortness of breath. Jeffrey [name] also admits to eating a lot of fast foods as Jeffrey [name] does not cook. Jeffrey [name] does note a lot of flatulence and indigestion after Jeffrey [name] eats for which Jeffrey [name] takes Pepto-Bismol and simethicone. Jeffrey [name] denies taking sublingual nitroglycerin for which Jeffrey [name] has ordered when necessary.  In the ED, Jeffrey [name] was afebrile and hematologically stable, but with moderate hypertension. His lab data are significant for a negative troponin I 1, sodium of 133, glucose of 265, normal lipase, normal LFTs, and a relatively normal urinalysis. CT of his abdomen and pelvis revealed no acute findings; remote ununited lower left rib fractures; diverticulosis without acute diverticulitis, and enlarged prostate gland. His chest x-ray revealed no acute cardiopulmonary disease. His EKG revealed normal sinus rhythm with ST-T wave abnormalities noted in the inferior lateral leads, and  T-wave depression laterally which appears slightly worsened, otherwise other changes were chronic. Jeffrey [name] is being admitted for further evaluation and management.    Review of Systems:  As above in history present illness. In addition, Jeffrey [name] does have some anxiety when Jeffrey [name] gets upset with his girlfriend; sometimes Jeffrey [name] is lightheaded and dizzy when Jeffrey [name] walks; Jeffrey [name] has chronic abdominal pain and gas; otherwise review systems is negative.  Past Medical History  Diagnosis Date  . Diabetes mellitus, type II   . Hyperlipidemia   . Coronary atherosclerosis of native coronary artery     a. CABG x 4 in 1989 (VG->OM1->OM2, VG->RCA, LIMA->LAD), b. 05/2010: DES to VG-OM1/OM2, DES to distal LCx. c. NSTEMI in 04/2011 - TO distal LCX stent and VG->OM2. d. 02/2012 NSTEMI DES to VG-OM1/continuation to OM2 occluded. e. inferior STEMI s/p DES to SVG-RAMUS 06/2012. f. inferolat STEMI 09/2012 s/p DES to SVG-interm; g. Lex MV (11/14):  EF 35%, inf-lat scar with small peri-infarct ischemia  . Essential hypertension, benign   . Osteoarthritis   . History of stroke   . History of pneumonia   . Cervical vertebral fracture   . Chronic back pain   . Benign prostatic hypertrophy     History of urinary retention  . Peptic ulcer disease   . Gastroesophageal reflux disease   . Ischemic cardiomyopathy Nov 2015    EF 35% cath, 45-50% by echo   Past Surgical History  Procedure Laterality Date  . Tonsillectomy    . Coronary artery bypass graft  1989  . Coronary angioplasty  10/12, 8/13, 12/13, 3/14    SVG-OM PCI  . Cardiac catheterization  05/19/14    SVG-OM occl- medical Rx  . Left heart catheterization with coronary  angiogram N/A 11/01/2011    Procedure: LEFT HEART CATHETERIZATION WITH CORONARY ANGIOGRAM;  Surgeon: Lorretta Harp, MD;  Location: [facility] CATH LAB;  Service: Cardiovascular;  Laterality: N/A;  . Percutaneous coronary stent intervention (pci-s) N/A 11/01/2011    Procedure: PERCUTANEOUS CORONARY STENT INTERVENTION  (PCI-S);  Surgeon: Lorretta Harp, MD;  Location: [facility] CATH LAB;  Service: Cardiovascular;  Laterality: N/A;  . Left heart catheterization with coronary/graft angiogram N/A 03/10/2012    Procedure: LEFT HEART CATHETERIZATION WITH [name];  Surgeon: Sherren Mocha, MD;  Location: [facility] CATH LAB;  Service: Cardiovascular;  Laterality: N/A;  . Left heart catheterization with coronary angiogram N/A 06/18/2012    Procedure: LEFT HEART CATHETERIZATION WITH CORONARY ANGIOGRAM;  Surgeon: Peter M Martinique, MD;  Location: [facility] CATH LAB;  Service: Cardiovascular;  Laterality: N/A;  . Percutaneous coronary stent intervention (pci-s)  06/18/2012    Procedure: PERCUTANEOUS CORONARY STENT INTERVENTION (PCI-S);  Surgeon: Peter M Martinique, MD;  Location: [facility] CATH LAB;  Service: Cardiovascular;;  . Left heart catheterization with coronary/graft angiogram  10/06/2012    Procedure: LEFT HEART CATHETERIZATION WITH [name];  Surgeon: Burnell Blanks, MD;  Location: [facility] CATH LAB;  Service: Cardiovascular;;  . Percutaneous coronary stent intervention (pci-s)  10/06/2012    Procedure: PERCUTANEOUS CORONARY STENT INTERVENTION (PCI-S);  Surgeon: Burnell Blanks, MD;  Location: [facility] CATH LAB;  Service: Cardiovascular;;  . Left heart catheterization with coronary/graft angiogram N/A 11/09/2012    Procedure: LEFT HEART CATHETERIZATION WITH [name];  Surgeon: Peter M Martinique, MD;  Location: [facility] CATH LAB;  Service: Cardiovascular;  Laterality: N/A;  . Left heart catheterization with coronary/graft angiogram N/A 05/19/2014    Procedure: LEFT HEART CATHETERIZATION WITH [name];  Surgeon: Troy Sine, MD;  Location: [facility] CATH LAB;  Service: Cardiovascular;  Laterality: N/A;   Social History: Patient is divorced. Jeffrey [name] has one grown daughter. Jeffrey [name] lives in [place] alone. Jeffrey [name] stopped smoking 53 years ago. Jeffrey [name] chews tobacco occasionally. Jeffrey [name] denies alcohol and illicit drug use. Jeffrey [name]  still drives.    No Known Allergies  Family History  Problem Relation Age of Onset  . Early death      Parents died young  . Appendicitis Mother     Pt was 44 year old  . Heart attack Father 7     Prior to Admission medications   Medication Sig Start Date End Date Taking? Authorizing Provider  acetaminophen (TYLENOL) 325 MG tablet Take 2 tablets (650 mg total) by mouth every 4 (four) hours as needed for headache or mild pain. 05/21/14  Yes Luke K Kilroy, PA-C  albuterol (PROVENTIL HFA;VENTOLIN HFA) 108 (90 BASE) MCG/ACT inhaler Inhale 2 puffs into the lungs every 6 (six) hours as needed for wheezing or shortness of breath.   Yes Historical Provider, MD  aspirin EC 81 MG tablet Take 81 mg by mouth daily.   Yes Historical Provider, MD  atorvastatin (LIPITOR) 40 MG tablet Take 1 tablet (40 mg total) by mouth daily at 6 PM. 05/29/13  Yes Scott T Kathlen Mody, PA-C  bismuth subsalicylate (PEPTO BISMOL) 262 MG/15ML suspension Take 30 mLs by mouth every 6 (six) hours as needed for indigestion or diarrhea or loose stools.    Yes Historical Provider, MD  BRILINTA 90 MG TABS tablet TAKE 1 TABLET BY MOUTH TWICE DAILY. 01/16/15  Yes Herminio Commons, MD  carvedilol (COREG) 3.125 MG tablet TAKE 1 TABLET BY MOUTH TWICE DAILY WITH MEALS. (HEART RATE & BLOOD PRESSURE) 10/06/14  Yes Jamesetta So  Blanchard Mane, MD  diphenoxylate-atropine (LOMOTIL) 2.5-0.025 MG per tablet Take 1 tablet by mouth 4 (four) times daily as needed for diarrhea or loose stools. 01/07/15  Yes Tanna Furry, MD  furosemide (LASIX) 20 MG tablet Take 20 mg by mouth as needed for fluid. Take 1 tablet as needed if 3 lb weight gain. 05/06/13  Yes Lendon Colonel, NP  isosorbide mononitrate (IMDUR) 30 MG 24 hr tablet TAKE 1 TABLET BY MOUTH ONCE DAILY. 09/07/14  Yes Herminio Commons, MD  levothyroxine (SYNTHROID, LEVOTHROID) 50 MCG tablet Take 50 mcg by mouth daily before breakfast.  09/08/14  Yes Historical Provider, MD  metFORMIN (GLUCOPHAGE) 500 MG  tablet Take 2 tablets (1,000 mg total) by mouth 2 (two) times daily with a meal. 05/22/14  Yes Erlene Quan, PA-C  Multiple Vitamin (MULTIVITAMIN WITH MINERALS) TABS Take 1 tablet by mouth daily.   Yes Historical Provider, MD  nitroGLYCERIN (NITROSTAT) 0.4 MG SL tablet Place 0.4 mg under the tongue every 5 (five) minutes as needed for chest pain.   Yes Historical Provider, MD  pantoprazole (PROTONIX) 40 MG tablet Take 40 mg by mouth daily.   Yes Historical Provider, MD  potassium chloride (K-DUR) 10 MEQ tablet Take 10 mEq by mouth as needed. Takes along with the Furosemide when Jeffrey [name] has swelling.   Yes Historical Provider, MD  simethicone (MYLICON) 80 MG chewable tablet Chew 80 mg by mouth every 6 (six) hours as needed for flatulence.   Yes Historical Provider, MD  tamsulosin (FLOMAX) 0.4 MG CAPS Take 0.4 mg by mouth daily after supper.  01/27/13  Yes Historical Provider, MD  HYDROmorphone (DILAUDID) 4 MG tablet Take 1 tablet (4 mg total) by mouth every 4 (four) hours as needed for severe pain. Patient not taking: Reported on 12/16/2014 12/13/14   Milton Ferguson, MD  metroNIDAZOLE (FLAGYL) 500 MG tablet Take 1 tablet (500 mg total) by mouth 2 (two) times daily. Patient not taking: Reported on 02/25/2015 01/07/15   Tanna Furry, MD  oxyCODONE-acetaminophen (PERCOCET) 5-325 MG per tablet Take 1 tablet by mouth every 4 (four) hours as needed for moderate pain. Patient not taking: Reported on 12/16/2014 7/98/92   Delora Fuel, MD   Physical Exam: Filed Vitals:   03/02/15 1300 03/02/15 1330 03/02/15 1400 03/02/15 1430  BP: 151/71 154/72 169/71 157/100  Pulse:  60 64 66  Temp:      TempSrc:      Resp:   18 16  Height:      Weight:      SpO2:  97% 97% 95%    Wt Readings from Last 3 Encounters:  03/02/15 77.111 kg (170 lb)  02/25/15 77.111 kg (170 lb)  01/07/15 77.111 kg (170 lb)    General:  Appears calm and comfortable; elderly 81 year old Caucasian man in no acute distress. Eyes: PERRL, normal lids,  irises & conjunctiva; conjunctivae are clear and sclerae are white. ENT: grossly normal hearing, lips & tongue; oropharynx with mildly dry mucous membranes, no teeth. Neck: no LAD, masses or thyromegaly Cardiovascular: S1, S2, with a soft systolic murmur. No LE edema. Telemetry: SR, no arrhythmias  Respiratory: CTA bilaterally, no w/r/r. Normal respiratory effort. Abdomen:  positive bowel sounds, soft, nontender, nondistended. Skin: no rash or induration seen on limited exam Musculoskeletal: grossly normal tone BUE/BLE; mild chronic hypertrophic arthritic changes seen at his hands; no acute hot red joints. Psychiatric: grossly normal mood and affect, speech fluent and appropriate Neurologic: grossly non-focal; cranial nerves II through XII are  intact.           Labs on Admission:  Basic Metabolic Panel:  Recent Labs Lab 02/25/15 1143 03/02/15 0900  NA 134* 133*  K 4.2 4.2  CL 103 102  CO2 22 22  GLUCOSE 277* 265*  BUN 13 13  CREATININE 0.89 0.85  CALCIUM 9.1 9.0   Liver Function Tests:  Recent Labs Lab 02/25/15 1143 03/02/15 0900  AST 25 25  ALT 23 24  ALKPHOS 81 79  BILITOT 0.6 0.6  PROT 6.6 6.4*  ALBUMIN 3.8 3.7    Recent Labs Lab 03/02/15 0900  LIPASE 17*   No results for input(s): AMMONIA in the last 168 hours. CBC:  Recent Labs Lab 02/25/15 1143 03/02/15 0900  WBC 7.7 7.2  NEUTROABS  --  5.2  HGB 13.3 13.3  HCT 39.4 40.3  MCV 93.6 93.9  PLT 134* 163   Cardiac Enzymes:  Recent Labs Lab 02/25/15 1143 03/02/15 0900 03/02/15 1224  TROPONINI <0.03 <0.03 <0.03    BNP (last 3 results) No results for input(s): BNP in the last 8760 hours.  ProBNP (last 3 results) No results for input(s): PROBNP in the last 8760 hours.  CBG: No results for input(s): GLUCAP in the last 168 hours.  Radiological Exams on Admission: Dg Chest 2 View  03/02/2015   CLINICAL DATA:  Chest pain  EXAM: CHEST  2 VIEW  COMPARISON:  12/13/2014  FINDINGS: Normal heart  size. Clear lungs. No pneumothorax or pleural effusion. Stable thoracic spine.  IMPRESSION: No active cardiopulmonary disease.   Electronically Signed   By: Marybelle Killings M.D.   On: 03/02/2015 10:16   Ct Abdomen Pelvis W Contrast  03/02/2015   CLINICAL DATA:  Mid abdominal pain began this morning.  EXAM: CT ABDOMEN AND PELVIS WITH CONTRAST  TECHNIQUE: Multidetector CT imaging of the abdomen and pelvis was performed using the standard protocol following bolus administration of intravenous contrast.  CONTRAST:  52mL OMNIPAQUE IOHEXOL 300 MG/ML SOLN, 170mL OMNIPAQUE IOHEXOL 300 MG/ML SOLN  COMPARISON:  None.  FINDINGS: Lower chest: The lung bases are clear except for dependent subpleural atelectasis. The heart is normal in size. No pericardial effusion. The distal esophagus is grossly normal. There are remote appearing incompletely healed lower left rib fractures involving the eighth, ninth and eleventh ribs.  Hepatobiliary: Mild fatty infiltration of the liver but no focal hepatic lesions or intrahepatic biliary dilatation. The gallbladder appears normal. No common bile duct dilatation.  Pancreas: No mass, inflammation or ductal dilatation. Small duodenum diverticulum noted near the pancreatic head.  Spleen: Normal size.  No focal lesions.  Adrenals/Urinary Tract: The adrenal glands and kidneys are unremarkable. No renal calculi or mass. No hydronephrosis. No ureteral or bladder calculi. Mild diffuse bladder wall thickening and enlarged prostate gland.  Stomach/Bowel: The stomach, duodenum, small bowel and colon are unremarkable. No inflammatory changes, mass lesions or obstructive findings. The terminal ileum is normal. The appendix is normal. Sigmoid diverticulosis without findings for acute diverticulitis.  Vascular/Lymphatic: Advanced atherosclerotic calcifications involving the aorta and branch vessel ostia. The branch vessels are patent. The major venous structures are patent. Small scattered mesenteric and  retroperitoneal lymph nodes but no mass or adenopathy.  Other: Markedly enlarged prostate gland with median lobe hypertrophy impressing on the base of the bladder. Diffuse bladder wall thickening likely due to partial bladder outlet obstruction. No pelvic mass or adenopathy. No free pelvic fluid collections. No inguinal mass or adenopathy.  Musculoskeletal: No significant bony findings. There are  degenerative changes involving the spine.  IMPRESSION: 1. No acute abdominal/pelvic findings, mass lesions or lymphadenopathy. 2. Remote ununited lower left rib fractures. 3. Advanced atherosclerotic calcifications involving the abdominal aorta but no aneurysm or dissection. 4. Sigmoid diverticulosis without findings for acute diverticulitis. 5. Enlarged prostate gland with median lobe hypertrophy impressing on the base of the bladder and likely causing partial bladder outlet obstruction.   Electronically Signed   By: Marijo Sanes M.D.   On: 03/02/2015 11:17    EKG: Independently reviewed.   Assessment/Plan Principal Problem:   Atypical chest pain Active Problems:   Dyspepsia   Abdominal pain, epigastric   Diabetes mellitus, type II   Coronary atherosclerosis of native coronary artery   Cardiomyopathy, ischemic   Essential hypertension   1. Atypical chest pain in this patient with a history of coronary artery disease, ischemic cardiomyopathy, status post PTCA and CABG 4. The patient's symptomatology appears to be more consistent with GI. His EKG did apparently have some new T-wave changes, but his chest x-ray and troponin I 2 so far have been negative. Cardiologist, Dr. Harl Bowie evaluated the patient in the ED. Jeffrey [name] recommended that the patient be admitted for observation to cycle cardiac enzymes. Jeffrey [name] recommended continuance of the patient's chronic cardiac medications and antiplatelet therapy. Jeffrey [name] recommended ordering a 2-D echocardiogram. Will add when necessary morphine as needed for pain. 2. Epigastric  abdominal pain. Patient's symptomatology could be secondary to flatulence from high fat gaseous foods, mild gastritis, possible gastroparesis, or chronic dyspepsia. His lipase and liver transaminases were within normal limits. CT of his abdomen and pelvis revealed no acute intra-abdominal/pelvic findings. His abdomen is benign on exam. Jeffrey [name] was given IV Protonix in the ED. We'll continue oral Protonix. Will continue simethicone and add when necessary Mylanta; when necessary Zofran. Will add daily at bedtime Pepcid as well. Patient was counseled on following a low-fat diet, however, Jeffrey [name] reported relying on fast foods and quick packaged foods as Jeffrey [name] does not cook. 3. Essential hypertension. The patient is treated chronically with Coreg. It will be continued. Will add when necessary hydralazine if his blood pressure becomes uncontrolled. 4. Type 2 diabetes mellitus. The patient's treated chronically with metformin. His blood sugars are generally in the 200s at home. It is relatively uncontrolled on admission with a glucose of 265. Metformin is on hold secondary to IV contrast given. Will treat his diabetes with sliding scale NovoLog. Will order hemoglobin A1c. 5. Hypothyroidism. Will continue Synthroid. Will order TSH/free T4. 6. Mild hyponatremia. Will hold Lasix. We'll start gentle IV fluids overnight.    Code Status: Full code DVT Prophylaxis: Lovenox Family Communication: Discussed with patient; family not available Disposition Plan: Anticipate discharge to home tomorrow  Time spent: One hour  [facility] Pager [phone]

## 2015-03-02 NOTE — Progress Notes (Signed)
Note correction.  Metformin sheets given to patient, metformin held for 48 hrs./bbj

## 2015-03-03 DIAGNOSIS — E785 Hyperlipidemia, unspecified: Secondary | ICD-10-CM | POA: Diagnosis not present

## 2015-03-03 DIAGNOSIS — R1013 Epigastric pain: Secondary | ICD-10-CM | POA: Diagnosis not present

## 2015-03-03 DIAGNOSIS — R079 Chest pain, unspecified: Secondary | ICD-10-CM | POA: Diagnosis not present

## 2015-03-03 DIAGNOSIS — E119 Type 2 diabetes mellitus without complications: Secondary | ICD-10-CM | POA: Diagnosis not present

## 2015-03-03 DIAGNOSIS — R0789 Other chest pain: Secondary | ICD-10-CM | POA: Diagnosis not present

## 2015-03-03 LAB — GLUCOSE, CAPILLARY: Glucose-Capillary: 194 mg/dL — ABNORMAL HIGH (ref 65–99)

## 2015-03-03 LAB — COMPREHENSIVE METABOLIC PANEL
ALK PHOS: 72 U/L (ref 38–126)
ALT: 20 U/L (ref 17–63)
AST: 22 U/L (ref 15–41)
Albumin: 3.5 g/dL (ref 3.5–5.0)
Anion gap: 7 (ref 5–15)
BUN: 11 mg/dL (ref 6–20)
CALCIUM: 8.6 mg/dL — AB (ref 8.9–10.3)
CO2: 26 mmol/L (ref 22–32)
CREATININE: 0.77 mg/dL (ref 0.61–1.24)
Chloride: 104 mmol/L (ref 101–111)
Glucose, Bld: 212 mg/dL — ABNORMAL HIGH (ref 65–99)
Potassium: 4 mmol/L (ref 3.5–5.1)
Sodium: 137 mmol/L (ref 135–145)
TOTAL PROTEIN: 6 g/dL — AB (ref 6.5–8.1)
Total Bilirubin: 0.5 mg/dL (ref 0.3–1.2)

## 2015-03-03 LAB — TROPONIN I: Troponin I: <0.03 ng/mL (ref <0.031)

## 2015-03-03 LAB — HEMOGLOBIN A1C
Hgb A1c MFr Bld: 9.2 % — ABNORMAL HIGH (ref 4.8–5.6)
MEAN PLASMA GLUCOSE: 217 mg/dL

## 2015-03-03 MED ORDER — GLIMEPIRIDE 2 MG PO TABS: ORAL_TABLET | ORAL | Status: DC

## 2015-03-03 MED ORDER — PNEUMOCOCCAL VAC POLYVALENT 25 MCG/0.5ML IJ INJ
0.5000 mL | INJECTION | INTRAMUSCULAR | Status: DC
  Filled 2015-03-03: qty 0.5

## 2015-03-03 MED ORDER — OXYCODONE-ACETAMINOPHEN 5-325 MG PO TABS: 1.0000 | ORAL_TABLET | Freq: Four times a day (QID) | ORAL | Status: DC | PRN

## 2015-03-03 MED ORDER — ISOSORBIDE MONONITRATE ER 30 MG PO TB24: 45.0000 mg | ORAL_TABLET | Freq: Every day | ORAL | Status: DC

## 2015-03-03 MED ORDER — METFORMIN HCL 500 MG PO TABS
1000.0000 mg | ORAL_TABLET | Freq: Two times a day (BID) | ORAL | Status: DC
Start: 2015-03-03 — End: 2015-10-17

## 2015-03-03 MED ORDER — FAMOTIDINE 20 MG PO TABS: 20.0000 mg | ORAL_TABLET | Freq: Every day | ORAL | Status: DC

## 2015-03-03 NOTE — Discharge Summary (Signed)
Physician Discharge Summary  UMAR PATMON IOE:[id number] DOB: January 19, 1929 DOA: 03/02/2015  PCP: [name]., MD  Admit date: 03/02/2015 Discharge date: 03/03/2015  Time spent: GREATER THAN 30 minutes  Recommendations for Outpatient Follow-up:  1. Patient was advised to hold metformin until 03/04/2015. He was given a prescription for Amaryl to take for 2 days and then stop.  2. Patient was discharged on a higher dose of isosorbide mononitrate.  Discharge Diagnoses:  1. Atypical chest pain. Myocardial infarction ruled out. 2. Dyspepsia with chronic abdominal pain. 3. Ischemic cardiomyopathy, CAD status post CABG 4 and 1989; subsequent myocardial infarctions with stenting in the past. 4. Type 2 diabetes mellitus. 5. Essential hypertension.  Discharge Condition: Improved.  Diet recommendation: Heart healthy, carb modified.  Filed Weights   03/02/15 0841 03/02/15 1619 03/03/15 0716  Weight: 77.111 kg (170 lb) 77.111 kg (170 lb) 75.751 kg (167 lb)    History of present illness:  Patient is an 81 year old man with a history of CAD, previous CABG, subsequent myocardial infarction with stenting, hypertension, and diabetes mellitus, who presented to the emergency department on 03/02/2015 with a complaint of precordial chest pain and abdominal pain with gaseous symptomatology. In the ED, he was afebrile and hematologically stable, but with moderate hypertension. His lab data are significant for a negative troponin I 1, sodium of 133, glucose of 265, normal lipase, normal LFTs, and a relatively normal urinalysis. CT of his abdomen and pelvis revealed no acute findings; remote ununited lower left rib fractures; diverticulosis without acute diverticulitis, and enlarged prostate gland. His chest x-ray revealed no acute cardiopulmonary disease. His EKG revealed normal sinus rhythm with ST-T wave abnormalities noted in the inferior lateral leads, and T-wave depression laterally which appeared  slightly worsened, otherwise other changes were chronic. He was admitted for further evaluation and management.   Hospital Course:  The patient was restarted on his chronic cardiac medications including carvedilol, aspirin, Brinlita, and isosorbide mononitrate. Gentle IV fluids were started. As needed opiate analgesia was ordered for pain. Oxygen was applied as needed. Protonix was continued for chronic GERD. In addition, Mylanta was added when necessary. Pepcid was added daily at bedtime. Metformin was withheld secondary to IV contrast given with the CT scan of the abdomen and pelvis. Sliding scale NovoLog was ordered for treatment of his diabetes. Cardiology was consulted. Dr. Harl Bowie opined that the patient's symptomatology was atypical for cardiac pain and was suggestive of a gastroenterology etiology. Nevertheless, he recommended cycling the cardiac enzymes and ordering a 2-D echocardiogram. The results of the 2-D echocardiogram revealed an EF of 45-50% and grade 1 diastolic dysfunction, similar to the echo in January 2015.  Patient's TSH and free T4 were within normal limits. His hemoglobin A1c was 9.2, indicating suboptimal control of his diabetes. He takes metformin twice daily chronically. He is being prescribed Amaryl for 2 days while metformin is being held for another 24 hours. Although his diabetes is not well-controlled, I deferred changing his diabetes medications to his PCP. In this 81 year old man, strict control of his diabetes could be problematic.  The patient ruled out for myocardial infarction. He had no complaints of chest pain during the hospitalization. At the time of discharge, it was decided that he would benefit from a slightly higher dose of isosorbide mononitrate, and therefore, it was increased to 45 mg daily. In addition, he was prescribed Pepcid at bedtime and instructed to continue Protonix each morning. He was advised to eat less greasy and salty foods.  Procedures:  2-D echocardiogram 03/02/15:- Left ventricle: The cavity size was normal. Wall thickness was normal. Systolic function was mildly reduced. The estimated ejection fraction was in the range of 45% to 50%. Diffuse hypokinesis. Doppler parameters are consistent with abnormal left ventricular relaxation (grade 1 diastolic dysfunction). - Aortic valve: Moderately calcified annulus. Trileaflet; severely thickened leaflets. Valve area (VTI): 1.63 cm^2. Valve area (Vmax): 1.77 cm^2. - Mitral valve: Mildly calcified annulus. Mildly thickened leaflets - Left atrium: The atrium was moderately dilated. - Right atrium: The atrium was mildly dilated. - Technically adequate study.  Consultations:  Cardiology  Discharge Exam: Filed Vitals:   03/03/15 0716  BP: 144/72  Pulse: 66  Temp: 98.2 F (36.8 C)  Resp: 20   oxygen saturation 100% on room air.  General: Pleasant alert elderly 81 year old man in no acute distress. Cardiovascular: S1, S2, with a soft systolic murmur. Respiratory: Clear to auscultation bilaterally. Abdomen: Positive bowel sounds, soft, minimal tenderness hypogastrium; no guarding or distention. Extremities: No pedal edema.  Discharge Instructions   Discharge Instructions    Diet - low sodium heart healthy    Complete by:  As directed      Diet Carb Modified    Complete by:  As directed      Discharge instructions    Complete by:  As directed   DO NOT TAKE METFORMIN UNTIL SUNDAY. TRY TO EAT FOODS THAT ARE LESS GREASY, LESS SALTY, AND LESS SWEET. CALL YOUR HEART NEXT MONDAY TO SCHEDULE A FOLLOW-UP APPOINTMENT.     Increase activity slowly    Complete by:  As directed           Current Discharge Medication List    START taking these medications   Details  famotidine (PEPCID) 20 MG tablet Take 1 tablet (20 mg total) by mouth at bedtime. Qty: 30 tablet, Refills: 3    glimepiride (AMARYL) 2 MG tablet FOR YOUR DIABETES WHILE OFF OF  METFORMIN, TAKE 1 TABLET TODAY AND TOMORROW. Qty: 2 tablet, Refills: 0      CONTINUE these medications which have CHANGED   Details  isosorbide mononitrate (IMDUR) 30 MG 24 hr tablet Take 1.5 tablets (45 mg total) by mouth daily. Qty: 45 tablet, Refills: 11    metFORMIN (GLUCOPHAGE) 500 MG tablet Take 2 tablets (1,000 mg total) by mouth 2 (two) times daily with a meal. RESTART ON Sunday, 03/04/15.    oxyCODONE-acetaminophen (PERCOCET) 5-325 MG per tablet Take 1 tablet by mouth every 6 (six) hours as needed for moderate pain. Qty: 20 tablet, Refills: 0      CONTINUE these medications which have NOT CHANGED   Details  acetaminophen (TYLENOL) 325 MG tablet Take 2 tablets (650 mg total) by mouth every 4 (four) hours as needed for headache or mild pain.    albuterol (PROVENTIL HFA;VENTOLIN HFA) 108 (90 BASE) MCG/ACT inhaler Inhale 2 puffs into the lungs every 6 (six) hours as needed for wheezing or shortness of breath.    aspirin EC 81 MG tablet Take 81 mg by mouth daily.    atorvastatin (LIPITOR) 40 MG tablet Take 1 tablet (40 mg total) by mouth daily at 6 PM. Qty: 30 tablet, Refills: 11    bismuth subsalicylate (PEPTO BISMOL) 262 MG/15ML suspension Take 30 mLs by mouth every 6 (six) hours as needed for indigestion or diarrhea or loose stools.     BRILINTA 90 MG TABS tablet TAKE 1 TABLET BY MOUTH TWICE DAILY. Qty: 60 tablet, Refills: 3    carvedilol (  COREG) 3.125 MG tablet TAKE 1 TABLET BY MOUTH TWICE DAILY WITH MEALS. (HEART RATE & BLOOD PRESSURE) Qty: 60 tablet, Refills: 6    diphenoxylate-atropine (LOMOTIL) 2.5-0.025 MG per tablet Take 1 tablet by mouth 4 (four) times daily as needed for diarrhea or loose stools. Qty: 30 tablet, Refills: 0    furosemide (LASIX) 20 MG tablet Take 20 mg by mouth as needed for fluid. Take 1 tablet as needed if 3 lb weight gain.    levothyroxine (SYNTHROID, LEVOTHROID) 50 MCG tablet Take 50 mcg by mouth daily before breakfast.     Multiple Vitamin  (MULTIVITAMIN WITH MINERALS) TABS Take 1 tablet by mouth daily.    nitroGLYCERIN (NITROSTAT) 0.4 MG SL tablet Place 0.4 mg under the tongue every 5 (five) minutes as needed for chest pain.    pantoprazole (PROTONIX) 40 MG tablet Take 40 mg by mouth daily.    potassium chloride (K-DUR) 10 MEQ tablet Take 10 mEq by mouth as needed. Takes along with the Furosemide when he has swelling.    simethicone (MYLICON) 80 MG chewable tablet Chew 80 mg by mouth every 6 (six) hours as needed for flatulence.    tamsulosin (FLOMAX) 0.4 MG CAPS Take 0.4 mg by mouth daily after supper.       STOP taking these medications     HYDROmorphone (DILAUDID) 4 MG tablet      metroNIDAZOLE (FLAGYL) 500 MG tablet        No Known Allergies    The results of significant diagnostics from this hospitalization (including imaging, microbiology, ancillary and laboratory) are listed below for reference.    Significant Diagnostic Studies: Dg Chest 2 View  03/02/2015   CLINICAL DATA:  Chest pain  EXAM: CHEST  2 VIEW  COMPARISON:  12/13/2014  FINDINGS: Normal heart size. Clear lungs. No pneumothorax or pleural effusion. Stable thoracic spine.  IMPRESSION: No active cardiopulmonary disease.   Electronically Signed   By: Marybelle Killings M.D.   On: 03/02/2015 10:16   Ct Abdomen Pelvis W Contrast  03/02/2015   CLINICAL DATA:  Mid abdominal pain began this morning.  EXAM: CT ABDOMEN AND PELVIS WITH CONTRAST  TECHNIQUE: Multidetector CT imaging of the abdomen and pelvis was performed using the standard protocol following bolus administration of intravenous contrast.  CONTRAST:  33mL OMNIPAQUE IOHEXOL 300 MG/ML SOLN, 152mL OMNIPAQUE IOHEXOL 300 MG/ML SOLN  COMPARISON:  None.  FINDINGS: Lower chest: The lung bases are clear except for dependent subpleural atelectasis. The heart is normal in size. No pericardial effusion. The distal esophagus is grossly normal. There are remote appearing incompletely healed lower left rib fractures  involving the eighth, ninth and eleventh ribs.  Hepatobiliary: Mild fatty infiltration of the liver but no focal hepatic lesions or intrahepatic biliary dilatation. The gallbladder appears normal. No common bile duct dilatation.  Pancreas: No mass, inflammation or ductal dilatation. Small duodenum diverticulum noted near the pancreatic head.  Spleen: Normal size.  No focal lesions.  Adrenals/Urinary Tract: The adrenal glands and kidneys are unremarkable. No renal calculi or mass. No hydronephrosis. No ureteral or bladder calculi. Mild diffuse bladder wall thickening and enlarged prostate gland.  Stomach/Bowel: The stomach, duodenum, small bowel and colon are unremarkable. No inflammatory changes, mass lesions or obstructive findings. The terminal ileum is normal. The appendix is normal. Sigmoid diverticulosis without findings for acute diverticulitis.  Vascular/Lymphatic: Advanced atherosclerotic calcifications involving the aorta and branch vessel ostia. The branch vessels are patent. The major venous structures are patent. Small scattered mesenteric  and retroperitoneal lymph nodes but no mass or adenopathy.  Other: Markedly enlarged prostate gland with median lobe hypertrophy impressing on the base of the bladder. Diffuse bladder wall thickening likely due to partial bladder outlet obstruction. No pelvic mass or adenopathy. No free pelvic fluid collections. No inguinal mass or adenopathy.  Musculoskeletal: No significant bony findings. There are degenerative changes involving the spine.  IMPRESSION: 1. No acute abdominal/pelvic findings, mass lesions or lymphadenopathy. 2. Remote ununited lower left rib fractures. 3. Advanced atherosclerotic calcifications involving the abdominal aorta but no aneurysm or dissection. 4. Sigmoid diverticulosis without findings for acute diverticulitis. 5. Enlarged prostate gland with median lobe hypertrophy impressing on the base of the bladder and likely causing partial bladder  outlet obstruction.   Electronically Signed   By: Marijo Sanes M.D.   On: 03/02/2015 11:17    Microbiology: No results found for this or any previous visit (from the past 240 hour(s)).   Labs: Basic Metabolic Panel:  Recent Labs Lab 02/25/15 1143 03/02/15 0900 03/03/15 0417  NA 134* 133* 137  K 4.2 4.2 4.0  CL 103 102 104  CO2 22 22 26   GLUCOSE 277* 265* 212*  BUN 13 13 11   CREATININE 0.89 0.85 0.77  CALCIUM 9.1 9.0 8.6*   Liver Function Tests:  Recent Labs Lab 02/25/15 1143 03/02/15 0900 03/03/15 0417  AST 25 25 22   ALT 23 24 20   ALKPHOS 81 79 72  BILITOT 0.6 0.6 0.5  PROT 6.6 6.4* 6.0*  ALBUMIN 3.8 3.7 3.5    Recent Labs Lab 03/02/15 0900  LIPASE 17*   No results for input(s): AMMONIA in the last 168 hours. CBC:  Recent Labs Lab 02/25/15 1143 03/02/15 0900  WBC 7.7 7.2  NEUTROABS  --  5.2  HGB 13.3 13.3  HCT 39.4 40.3  MCV 93.6 93.9  PLT 134* 163   Cardiac Enzymes:  Recent Labs Lab 03/02/15 0900 03/02/15 1224 03/02/15 1602 03/02/15 2142 03/03/15 0417  TROPONINI <0.03 <0.03 <0.03 <0.03 <0.03   BNP: BNP (last 3 results) No results for input(s): BNP in the last 8760 hours.  ProBNP (last 3 results) No results for input(s): PROBNP in the last 8760 hours.  CBG:  Recent Labs Lab 03/02/15 1654 03/02/15 2216 03/03/15 0735  GLUCAP 297* 193* 194*       Signed:  Mattheus Rauls  [facility] 03/03/2015, 9:24 AM

## 2015-03-03 NOTE — Plan of Care (Signed)
Pt told we needed to wait on pneumo shot from pharm. Pt refused - stating he did't need that no more and just wanted to go home.

## 2015-03-03 NOTE — Discharge Planning (Signed)
Pt IV and tele removed.  Pt given discharge paper, explained and educated.  VSS and RN assessment revealed stability for DC. Given scripts.  Pt will be wheeled to car abnd is driving self home.  Pt has not received and Narc or pain meds during admit.

## 2015-03-04 ENCOUNTER — Emergency Department (HOSPITAL_COMMUNITY)
Admission: EM | Admit: 2015-03-04 | Discharge: 2015-03-04 | Disposition: A | Discharge status: Home / Self Care | Payer: Medicare Other | Attending: Emergency Medicine | Admitting: Emergency Medicine

## 2015-03-04 ENCOUNTER — Encounter (HOSPITAL_COMMUNITY): Payer: Self-pay | Admitting: Emergency Medicine

## 2015-03-04 DIAGNOSIS — Z87448 Personal history of other diseases of urinary system: Secondary | ICD-10-CM | POA: Diagnosis not present

## 2015-03-04 DIAGNOSIS — Z8673 Personal history of transient ischemic attack (TIA), and cerebral infarction without residual deficits: Secondary | ICD-10-CM | POA: Diagnosis not present

## 2015-03-04 DIAGNOSIS — Z951 Presence of aortocoronary bypass graft: Secondary | ICD-10-CM | POA: Diagnosis not present

## 2015-03-04 DIAGNOSIS — I10 Essential (primary) hypertension: Secondary | ICD-10-CM | POA: Insufficient documentation

## 2015-03-04 DIAGNOSIS — E785 Hyperlipidemia, unspecified: Secondary | ICD-10-CM | POA: Insufficient documentation

## 2015-03-04 DIAGNOSIS — Z7982 Long term (current) use of aspirin: Secondary | ICD-10-CM | POA: Insufficient documentation

## 2015-03-04 DIAGNOSIS — Z8711 Personal history of peptic ulcer disease: Secondary | ICD-10-CM | POA: Insufficient documentation

## 2015-03-04 DIAGNOSIS — G8929 Other chronic pain: Secondary | ICD-10-CM | POA: Diagnosis not present

## 2015-03-04 DIAGNOSIS — Z8781 Personal history of (healed) traumatic fracture: Secondary | ICD-10-CM | POA: Diagnosis not present

## 2015-03-04 DIAGNOSIS — I2510 Atherosclerotic heart disease of native coronary artery without angina pectoris: Secondary | ICD-10-CM | POA: Diagnosis not present

## 2015-03-04 DIAGNOSIS — E039 Hypothyroidism, unspecified: Secondary | ICD-10-CM | POA: Diagnosis not present

## 2015-03-04 DIAGNOSIS — Z79899 Other long term (current) drug therapy: Secondary | ICD-10-CM | POA: Diagnosis not present

## 2015-03-04 DIAGNOSIS — K219 Gastro-esophageal reflux disease without esophagitis: Secondary | ICD-10-CM | POA: Insufficient documentation

## 2015-03-04 DIAGNOSIS — Z8701 Personal history of pneumonia (recurrent): Secondary | ICD-10-CM | POA: Diagnosis not present

## 2015-03-04 DIAGNOSIS — M1990 Unspecified osteoarthritis, unspecified site: Secondary | ICD-10-CM | POA: Insufficient documentation

## 2015-03-04 DIAGNOSIS — Z87891 Personal history of nicotine dependence: Secondary | ICD-10-CM | POA: Insufficient documentation

## 2015-03-04 DIAGNOSIS — E119 Type 2 diabetes mellitus without complications: Secondary | ICD-10-CM | POA: Insufficient documentation

## 2015-03-04 NOTE — ED Notes (Signed)
Pt states that he took 2 regular Asprins prior to leaving house now

## 2015-03-04 NOTE — Discharge Instructions (Signed)
Return for any new or worse symptoms. Have your blood pressure followed up by your primary care doctor.

## 2015-03-04 NOTE — ED Notes (Signed)
Went out to triage pt, no answer.

## 2015-03-04 NOTE — ED Notes (Signed)
Pt says BP at home was 150/90.  C/o tingling to face.  Current BP 123/79.  Denies chest pain or SOB.  Discharge from [facility] yesterday.

## 2015-03-04 NOTE — ED Provider Notes (Addendum)
CSN: [id number]     Arrival date & time 03/04/15  1600 History   First MD Initiated Contact with Patient 03/04/15 1621     Chief Complaint  Patient presents with  . Hypertension     (Consider location/radiation/quality/duration/timing/severity/associated sxs/prior Treatment) The history is provided by the patient.   81 year old gentleman this discharge yesterday from it hospitalization that started on August 19 for chest pain. Patient did have a formal rule out cardiac enzymes were negative they wish by cardiology to include an echo without any significant findings. Patient was at home today started to feel a little strange and had tingling on the left side of his face checked his blood pressure was elevated he got concerned that something was wrong he took up aspirin and brought himself into the emergency department. Patient known to me patient does have a component of anxiety and does kick concerned about being at home by himself. Patient does have a history of significant coronary artery disease has had a CABG and does have stents had no chest pain with this whatsoever. No speech problems no weakness no dizziness. All symptoms have now resolved. Patient's blood pressure at home was 158/91. Upon arrival here patient's blood pressure systolic was 443.  Past Medical History  Diagnosis Date  . Diabetes mellitus, type II   . Hyperlipidemia   . Coronary atherosclerosis of native coronary artery     a. CABG x 4 in 1989 (VG->OM1->OM2, VG->RCA, LIMA->LAD), b. 05/2010: DES to VG-OM1/OM2, DES to distal LCx. c. NSTEMI in 04/2011 - TO distal LCX stent and VG->OM2. d. 02/2012 NSTEMI DES to VG-OM1/continuation to OM2 occluded. e. inferior STEMI s/p DES to SVG-RAMUS 06/2012. f. inferolat STEMI 09/2012 s/p DES to SVG-interm; g. Lex MV (11/14):  EF 35%, inf-lat scar with small peri-infarct ischemia  . Essential hypertension, benign   . Osteoarthritis   . History of stroke   . History of pneumonia   . Cervical  vertebral fracture   . Chronic back pain   . Benign prostatic hypertrophy     History of urinary retention  . Peptic ulcer disease   . Gastroesophageal reflux disease   . Ischemic cardiomyopathy Nov 2015    EF 35% cath, 45-50% by echo  . Hypothyroidism    Past Surgical History  Procedure Laterality Date  . Tonsillectomy    . Coronary artery bypass graft  1989  . Coronary angioplasty  10/12, 8/13, 12/13, 3/14    SVG-OM PCI  . Cardiac catheterization  05/19/14    SVG-OM occl- medical Rx  . Left heart catheterization with coronary angiogram N/A 11/01/2011    Procedure: LEFT HEART CATHETERIZATION WITH CORONARY ANGIOGRAM;  Surgeon: Lorretta Harp, MD;  Location: [facility] CATH LAB;  Service: Cardiovascular;  Laterality: N/A;  . Percutaneous coronary stent intervention (pci-s) N/A 11/01/2011    Procedure: PERCUTANEOUS CORONARY STENT INTERVENTION (PCI-S);  Surgeon: Lorretta Harp, MD;  Location: [facility] CATH LAB;  Service: Cardiovascular;  Laterality: N/A;  . Left heart catheterization with coronary/graft angiogram N/A 03/10/2012    Procedure: LEFT HEART CATHETERIZATION WITH [name];  Surgeon: Sherren Mocha, MD;  Location: [facility] CATH LAB;  Service: Cardiovascular;  Laterality: N/A;  . Left heart catheterization with coronary angiogram N/A 06/18/2012    Procedure: LEFT HEART CATHETERIZATION WITH CORONARY ANGIOGRAM;  Surgeon: Peter M Martinique, MD;  Location: [facility] CATH LAB;  Service: Cardiovascular;  Laterality: N/A;  . Percutaneous coronary stent intervention (pci-s)  06/18/2012    Procedure: PERCUTANEOUS CORONARY STENT INTERVENTION (PCI-S);  Surgeon: Peter M Martinique, MD;  Location: [facility] CATH LAB;  Service: Cardiovascular;;  . Left heart catheterization with coronary/graft angiogram  10/06/2012    Procedure: LEFT HEART CATHETERIZATION WITH [name];  Surgeon: Burnell Blanks, MD;  Location: [facility] CATH LAB;  Service: Cardiovascular;;  . Percutaneous coronary stent intervention  (pci-s)  10/06/2012    Procedure: PERCUTANEOUS CORONARY STENT INTERVENTION (PCI-S);  Surgeon: Burnell Blanks, MD;  Location: [facility] CATH LAB;  Service: Cardiovascular;;  . Left heart catheterization with coronary/graft angiogram N/A 11/09/2012    Procedure: LEFT HEART CATHETERIZATION WITH [name];  Surgeon: Peter M Martinique, MD;  Location: [facility] CATH LAB;  Service: Cardiovascular;  Laterality: N/A;  . Left heart catheterization with coronary/graft angiogram N/A 05/19/2014    Procedure: LEFT HEART CATHETERIZATION WITH [name];  Surgeon: Troy Sine, MD;  Location: [facility] CATH LAB;  Service: Cardiovascular;  Laterality: N/A;   Family History  Problem Relation Age of Onset  . Early death      Parents died young  . Appendicitis Mother     Pt was 48 year old  . Heart attack Father 3   Social History  Substance Use Topics  . Smoking status: Former Smoker -- 2.00 packs/day for 10 years    Types: Cigarettes    Start date: 07/14/1950    Quit date: 07/14/1961  . Smokeless tobacco: Current User    Types: Chew  . Alcohol Use: No    Review of Systems  Constitutional: Negative for fever.  HENT: Negative for congestion.   Eyes: Negative for visual disturbance.  Respiratory: Negative for shortness of breath.   Cardiovascular: Negative for chest pain.  Gastrointestinal: Negative for abdominal pain.  Genitourinary: Negative for hematuria.  Musculoskeletal: Negative for back pain.  Skin: Negative for rash.  Neurological: Positive for numbness. Negative for dizziness, syncope, facial asymmetry, speech difficulty, weakness and headaches.  Psychiatric/Behavioral: Negative for confusion.      Allergies  Review of patient's allergies indicates no known allergies.  Home Medications   Prior to Admission medications   Medication Sig Start Date End Date Taking? Authorizing Provider  acetaminophen (TYLENOL) 325 MG tablet Take 2 tablets (650 mg total) by mouth every 4  (four) hours as needed for headache or mild pain. 05/21/14   Erlene Quan, PA-C  albuterol (PROVENTIL HFA;VENTOLIN HFA) 108 (90 BASE) MCG/ACT inhaler Inhale 2 puffs into the lungs every 6 (six) hours as needed for wheezing or shortness of breath.    Historical Provider, MD  aspirin EC 81 MG tablet Take 81 mg by mouth daily.    Historical Provider, MD  atorvastatin (LIPITOR) 40 MG tablet Take 1 tablet (40 mg total) by mouth daily at 6 PM. 05/29/13   Liliane Shi, PA-C  bismuth subsalicylate (PEPTO BISMOL) 262 MG/15ML suspension Take 30 mLs by mouth every 6 (six) hours as needed for indigestion or diarrhea or loose stools.     Historical Provider, MD  BRILINTA 90 MG TABS tablet TAKE 1 TABLET BY MOUTH TWICE DAILY. 01/16/15   Herminio Commons, MD  carvedilol (COREG) 3.125 MG tablet TAKE 1 TABLET BY MOUTH TWICE DAILY WITH MEALS. (HEART RATE & BLOOD PRESSURE) 10/06/14   Herminio Commons, MD  diphenoxylate-atropine (LOMOTIL) 2.5-0.025 MG per tablet Take 1 tablet by mouth 4 (four) times daily as needed for diarrhea or loose stools. 01/07/15   Tanna Furry, MD  famotidine (PEPCID) 20 MG tablet Take 1 tablet (20 mg total) by mouth at bedtime. 03/03/15  Rexene Alberts, MD  furosemide (LASIX) 20 MG tablet Take 20 mg by mouth as needed for fluid. Take 1 tablet as needed if 3 lb weight gain. 05/06/13   Lendon Colonel, NP  glimepiride (AMARYL) 2 MG tablet FOR YOUR DIABETES WHILE OFF OF METFORMIN, TAKE 1 TABLET TODAY AND TOMORROW. 03/03/15   Rexene Alberts, MD  isosorbide mononitrate (IMDUR) 30 MG 24 hr tablet Take 1.5 tablets (45 mg total) by mouth daily. 03/03/15   Rexene Alberts, MD  levothyroxine (SYNTHROID, LEVOTHROID) 50 MCG tablet Take 50 mcg by mouth daily before breakfast.  09/08/14   Historical Provider, MD  metFORMIN (GLUCOPHAGE) 500 MG tablet Take 2 tablets (1,000 mg total) by mouth 2 (two) times daily with a meal. RESTART ON Sunday, 03/04/15. 03/03/15   Rexene Alberts, MD  Multiple Vitamin (MULTIVITAMIN WITH  MINERALS) TABS Take 1 tablet by mouth daily.    Historical Provider, MD  nitroGLYCERIN (NITROSTAT) 0.4 MG SL tablet Place 0.4 mg under the tongue every 5 (five) minutes as needed for chest pain.    Historical Provider, MD  oxyCODONE-acetaminophen (PERCOCET) 5-325 MG per tablet Take 1 tablet by mouth every 6 (six) hours as needed for moderate pain. 03/03/15   Rexene Alberts, MD  pantoprazole (PROTONIX) 40 MG tablet Take 40 mg by mouth daily.    Historical Provider, MD  potassium chloride (K-DUR) 10 MEQ tablet Take 10 mEq by mouth as needed. Takes along with the Furosemide when he has swelling.    Historical Provider, MD  simethicone (MYLICON) 80 MG chewable tablet Chew 80 mg by mouth every 6 (six) hours as needed for flatulence.    Historical Provider, MD  tamsulosin (FLOMAX) 0.4 MG CAPS Take 0.4 mg by mouth daily after supper.  01/27/13   Historical Provider, MD   BP 123/79 mmHg  Pulse 84  Temp(Src) 97.8 F (36.6 C) (Oral)  Resp 20  Ht 5\' 10"  (1.778 m)  Wt 170 lb (77.111 kg)  BMI 24.39 kg/m2  SpO2 100% Physical Exam  Constitutional: He is oriented to person, place, and time. He appears well-developed and well-nourished. No distress.  HENT:  Head: Normocephalic and atraumatic.  Mouth/Throat: Oropharynx is clear and moist.  Eyes: Conjunctivae and EOM are normal. Pupils are equal, round, and reactive to light.  Neck: Normal range of motion. Neck supple.  Cardiovascular: Normal rate, regular rhythm and normal heart sounds.   No murmur heard. Pulmonary/Chest: Effort normal and breath sounds normal. No respiratory distress.  Abdominal: Soft. Bowel sounds are normal. There is no tenderness.  Musculoskeletal: Normal range of motion. He exhibits no edema.  Neurological: He is alert and oriented to person, place, and time. No cranial nerve deficit. He exhibits normal muscle tone. Coordination normal.  Skin: Skin is warm. No rash noted.  Nursing note and vitals reviewed.   ED Course  Procedures  (including critical care time) Labs Review Labs Reviewed - No data to display  Imaging Review No results found. I have personally reviewed and evaluated these images and lab results as part of my medical decision-making.   EKG Interpretation   Date/Time:  Sunday March 04 2015 16:33:11 EDT Ventricular Rate:  74 PR Interval:  183 QRS Duration: 109 QT Interval:  422 QTC Calculation: 468 R Axis:   81 Text Interpretation:  Sinus rhythm Borderline right axis deviation  Abnormal inferior Q waves Nonspecific repol abnormality, lateral leads No  significant change since last tracing Confirmed by Mattix Imhof  MD, Laneisha Mino  [id number]) on 03/04/2015 4:57:31 PM  MDM   Final diagnoses:  Essential hypertension   Patient was some borderline blood pressures here today highest was systolic of 582 lowest was 123 recommend a log for blood pressure and follow-up with regular doctor. Patient does not have any chest pain today or any significant symptoms the tingling that he had in his face at home last for brief period of time and is now completely resolved. No other focal neuro deficits. Patient's EKG without any acute changes from his recent admission for cardiac rule out which resulted in the negative cardiac enzymes and an echocardiogram without significant abnormalities.     Fredia Sorrow, MD 03/04/15 [place], MD 03/04/15 [phone]

## 2015-03-05 ENCOUNTER — Other Ambulatory Visit: Payer: Self-pay | Admitting: Cardiovascular Disease

## 2015-03-05 ENCOUNTER — Encounter: Payer: Medicare Other | Admitting: Adult Health

## 2015-03-16 ENCOUNTER — Emergency Department (HOSPITAL_COMMUNITY)
Admission: EM | Admit: 2015-03-16 | Discharge: 2015-03-16 | Disposition: A | Discharge status: Home / Self Care | Payer: Medicare Other | Attending: Emergency Medicine | Admitting: Emergency Medicine

## 2015-03-16 ENCOUNTER — Emergency Department (HOSPITAL_COMMUNITY): Payer: Medicare Other

## 2015-03-16 ENCOUNTER — Encounter (HOSPITAL_COMMUNITY): Payer: Self-pay | Admitting: Emergency Medicine

## 2015-03-16 DIAGNOSIS — K130 Diseases of lips: Secondary | ICD-10-CM | POA: Diagnosis not present

## 2015-03-16 DIAGNOSIS — Z79899 Other long term (current) drug therapy: Secondary | ICD-10-CM | POA: Insufficient documentation

## 2015-03-16 DIAGNOSIS — E119 Type 2 diabetes mellitus without complications: Secondary | ICD-10-CM | POA: Diagnosis not present

## 2015-03-16 DIAGNOSIS — I10 Essential (primary) hypertension: Secondary | ICD-10-CM | POA: Diagnosis not present

## 2015-03-16 DIAGNOSIS — E785 Hyperlipidemia, unspecified: Secondary | ICD-10-CM | POA: Insufficient documentation

## 2015-03-16 DIAGNOSIS — R234 Changes in skin texture: Secondary | ICD-10-CM | POA: Insufficient documentation

## 2015-03-16 DIAGNOSIS — Z7902 Long term (current) use of antithrombotics/antiplatelets: Secondary | ICD-10-CM | POA: Diagnosis not present

## 2015-03-16 DIAGNOSIS — M1990 Unspecified osteoarthritis, unspecified site: Secondary | ICD-10-CM | POA: Insufficient documentation

## 2015-03-16 DIAGNOSIS — E039 Hypothyroidism, unspecified: Secondary | ICD-10-CM | POA: Insufficient documentation

## 2015-03-16 DIAGNOSIS — Z8673 Personal history of transient ischemic attack (TIA), and cerebral infarction without residual deficits: Secondary | ICD-10-CM | POA: Insufficient documentation

## 2015-03-16 DIAGNOSIS — Z8701 Personal history of pneumonia (recurrent): Secondary | ICD-10-CM | POA: Insufficient documentation

## 2015-03-16 DIAGNOSIS — N400 Enlarged prostate without lower urinary tract symptoms: Secondary | ICD-10-CM | POA: Insufficient documentation

## 2015-03-16 DIAGNOSIS — Z7982 Long term (current) use of aspirin: Secondary | ICD-10-CM | POA: Diagnosis not present

## 2015-03-16 DIAGNOSIS — Z951 Presence of aortocoronary bypass graft: Secondary | ICD-10-CM | POA: Diagnosis not present

## 2015-03-16 DIAGNOSIS — Z955 Presence of coronary angioplasty implant and graft: Secondary | ICD-10-CM | POA: Diagnosis not present

## 2015-03-16 DIAGNOSIS — K219 Gastro-esophageal reflux disease without esophagitis: Secondary | ICD-10-CM | POA: Diagnosis not present

## 2015-03-16 DIAGNOSIS — Z9889 Other specified postprocedural states: Secondary | ICD-10-CM | POA: Insufficient documentation

## 2015-03-16 DIAGNOSIS — Z8781 Personal history of (healed) traumatic fracture: Secondary | ICD-10-CM | POA: Diagnosis not present

## 2015-03-16 DIAGNOSIS — Z87891 Personal history of nicotine dependence: Secondary | ICD-10-CM | POA: Insufficient documentation

## 2015-03-16 DIAGNOSIS — R531 Weakness: Secondary | ICD-10-CM | POA: Diagnosis present

## 2015-03-16 DIAGNOSIS — R42 Dizziness and giddiness: Secondary | ICD-10-CM | POA: Diagnosis not present

## 2015-03-16 DIAGNOSIS — I2510 Atherosclerotic heart disease of native coronary artery without angina pectoris: Secondary | ICD-10-CM | POA: Insufficient documentation

## 2015-03-16 DIAGNOSIS — Z8711 Personal history of peptic ulcer disease: Secondary | ICD-10-CM | POA: Diagnosis not present

## 2015-03-16 DIAGNOSIS — G8929 Other chronic pain: Secondary | ICD-10-CM | POA: Diagnosis not present

## 2015-03-16 LAB — URINALYSIS, ROUTINE W REFLEX MICROSCOPIC
Bilirubin Urine: NEGATIVE
Glucose, UA: 100 mg/dL — AB
Hgb urine dipstick: NEGATIVE
KETONES UR: NEGATIVE mg/dL
LEUKOCYTES UA: NEGATIVE
NITRITE: NEGATIVE
PROTEIN: NEGATIVE mg/dL
Specific Gravity, Urine: 1.015 (ref 1.005–1.030)
UROBILINOGEN UA: 0.2 mg/dL (ref 0.0–1.0)
pH: 7.5 (ref 5.0–8.0)

## 2015-03-16 LAB — COMPREHENSIVE METABOLIC PANEL
ALT: 23 U/L (ref 17–63)
AST: 28 U/L (ref 15–41)
Albumin: 4 g/dL (ref 3.5–5.0)
Alkaline Phosphatase: 84 U/L (ref 38–126)
Anion gap: 9 (ref 5–15)
BILIRUBIN TOTAL: 0.5 mg/dL (ref 0.3–1.2)
BUN: 15 mg/dL (ref 6–20)
CO2: 23 mmol/L (ref 22–32)
Calcium: 9 mg/dL (ref 8.9–10.3)
Chloride: 101 mmol/L (ref 101–111)
Creatinine, Ser: 0.89 mg/dL (ref 0.61–1.24)
GFR calc Af Amer: >60 mL/min (ref >60)
Glucose, Bld: 224 mg/dL — ABNORMAL HIGH (ref 65–99)
POTASSIUM: 4.1 mmol/L (ref 3.5–5.1)
Sodium: 133 mmol/L — ABNORMAL LOW (ref 135–145)
TOTAL PROTEIN: 6.8 g/dL (ref 6.5–8.1)

## 2015-03-16 LAB — CBC WITH DIFFERENTIAL/PLATELET
BASOS ABS: 0 10*3/uL (ref 0.0–0.1)
Basophils Relative: 0 % (ref 0–1)
EOS ABS: 0.2 10*3/uL (ref 0.0–0.7)
EOS PCT: 2 % (ref 0–5)
HCT: 41 % (ref 39.0–52.0)
Hemoglobin: 13.8 g/dL (ref 13.0–17.0)
LYMPHS PCT: 15 % (ref 12–46)
Lymphs Abs: 1.4 10*3/uL (ref 0.7–4.0)
MCH: 31.7 pg (ref 26.0–34.0)
MCHC: 33.7 g/dL (ref 30.0–36.0)
MCV: 94 fL (ref 78.0–100.0)
Monocytes Absolute: 0.7 10*3/uL (ref 0.1–1.0)
Monocytes Relative: 8 % (ref 3–12)
Neutro Abs: 6.8 10*3/uL (ref 1.7–7.7)
Neutrophils Relative %: 75 % (ref 43–77)
PLATELETS: 162 10*3/uL (ref 150–400)
RBC: 4.36 MIL/uL (ref 4.22–5.81)
RDW: 12.6 % (ref 11.5–15.5)
WBC: 9.2 10*3/uL (ref 4.0–10.5)

## 2015-03-16 LAB — TROPONIN I: Troponin I: <0.03 ng/mL (ref <0.031)

## 2015-03-16 MED ORDER — SODIUM CHLORIDE 0.9 % IV BOLUS (SEPSIS)
500.0000 mL | Freq: Once | INTRAVENOUS | Status: AC
  Administered 2015-03-16: 500 mL via INTRAVENOUS

## 2015-03-16 NOTE — Discharge Instructions (Signed)

## 2015-03-16 NOTE — ED Notes (Signed)
Pt in wheelchair in waiting room. Cab called per pt request.

## 2015-03-16 NOTE — ED Notes (Signed)
Pt reports weakness and dizziness x 1 week. Sent by PCP for evaluation.

## 2015-03-16 NOTE — ED Provider Notes (Signed)
CSN: [id number]     Arrival date & time 03/16/15  1215 History  This chart was scribed for [name], ED Scribe. This patient was seen in room APA09/APA09 and the patient's care was started at 12:32 PM.   Chief Complaint  Patient presents with  . Weakness   The history is provided by the patient. No language interpreter was used.   PCP: [name]., MD HPI Comments: Jeffrey [name] is a 81 y.o. male, with PMHx noted below, who presents to the Emergency Department at the direction of his PCP, complaining of weakness with associated dizziness onset one week ago. Pt states he feels like he is going to pass out. Pt notes he was hospitalized on March 02, 2015, for chest pain and reports feeling at baseline until onset of present Sx. Pt denies any changes to daily meds, n/v, diarrhea, cough, SOB, appetite change, urinary Sx (including frequency, urgency or difficulty urinating), chest pain, or any other Sx at this time.   Past Medical History  Diagnosis Date  . Diabetes mellitus, type II   . Hyperlipidemia   . Coronary atherosclerosis of native coronary artery     a. CABG x 4 in 1989 (VG->OM1->OM2, VG->RCA, LIMA->LAD), b. 05/2010: DES to VG-OM1/OM2, DES to distal LCx. c. NSTEMI in 04/2011 - TO distal LCX stent and VG->OM2. d. 02/2012 NSTEMI DES to VG-OM1/continuation to OM2 occluded. e. inferior STEMI s/p DES to SVG-RAMUS 06/2012. f. inferolat STEMI 09/2012 s/p DES to SVG-interm; g. Lex MV (11/14):  EF 35%, inf-lat scar with small peri-infarct ischemia  . Essential hypertension, benign   . Osteoarthritis   . History of stroke   . History of pneumonia   . Cervical vertebral fracture   . Chronic back pain   . Benign prostatic hypertrophy     History of urinary retention  . Peptic ulcer disease   . Gastroesophageal reflux disease   . Ischemic cardiomyopathy Nov 2015    EF 35% cath, 45-50% by echo  . Hypothyroidism    Past Surgical History  Procedure  Laterality Date  . Tonsillectomy    . Coronary artery bypass graft  1989  . Coronary angioplasty  10/12, 8/13, 12/13, 3/14    SVG-OM PCI  . Cardiac catheterization  05/19/14    SVG-OM occl- medical Rx  . Left heart catheterization with coronary angiogram N/A 11/01/2011    Procedure: LEFT HEART CATHETERIZATION WITH CORONARY ANGIOGRAM;  Surgeon: Lorretta Harp, MD;  Location: [facility] CATH LAB;  Service: Cardiovascular;  Laterality: N/A;  . Percutaneous coronary stent intervention (pci-s) N/A 11/01/2011    Procedure: PERCUTANEOUS CORONARY STENT INTERVENTION (PCI-S);  Surgeon: Lorretta Harp, MD;  Location: [facility] CATH LAB;  Service: Cardiovascular;  Laterality: N/A;  . Left heart catheterization with coronary/graft angiogram N/A 03/10/2012    Procedure: LEFT HEART CATHETERIZATION WITH [name];  Surgeon: Sherren Mocha, MD;  Location: [facility] CATH LAB;  Service: Cardiovascular;  Laterality: N/A;  . Left heart catheterization with coronary angiogram N/A 06/18/2012    Procedure: LEFT HEART CATHETERIZATION WITH CORONARY ANGIOGRAM;  Surgeon: Peter M Martinique, MD;  Location: [facility] CATH LAB;  Service: Cardiovascular;  Laterality: N/A;  . Percutaneous coronary stent intervention (pci-s)  06/18/2012    Procedure: PERCUTANEOUS CORONARY STENT INTERVENTION (PCI-S);  Surgeon: Peter M Martinique, MD;  Location: [facility] CATH LAB;  Service: Cardiovascular;;  . Left heart catheterization with coronary/graft angiogram  10/06/2012    Procedure: LEFT HEART CATHETERIZATION WITH CORONARY/GRAFT ANGIOGRAM;  Surgeon:  Burnell Blanks, MD;  Location: [facility] CATH LAB;  Service: Cardiovascular;;  . Percutaneous coronary stent intervention (pci-s)  10/06/2012    Procedure: PERCUTANEOUS CORONARY STENT INTERVENTION (PCI-S);  Surgeon: Burnell Blanks, MD;  Location: [facility] CATH LAB;  Service: Cardiovascular;;  . Left heart catheterization with coronary/graft angiogram N/A 11/09/2012    Procedure: LEFT HEART CATHETERIZATION WITH  [name];  Surgeon: Peter M Martinique, MD;  Location: [facility] CATH LAB;  Service: Cardiovascular;  Laterality: N/A;  . Left heart catheterization with coronary/graft angiogram N/A 05/19/2014    Procedure: LEFT HEART CATHETERIZATION WITH [name];  Surgeon: Troy Sine, MD;  Location: [facility] CATH LAB;  Service: Cardiovascular;  Laterality: N/A;   Family History  Problem Relation Age of Onset  . Early death      Parents died young  . Appendicitis Mother     Pt was 4 year old  . Heart attack Father 61   Social History  Substance Use Topics  . Smoking status: Former Smoker -- 2.00 packs/day for 10 years    Types: Cigarettes    Start date: 07/14/1950    Quit date: 07/14/1961  . Smokeless tobacco: Current User    Types: Chew  . Alcohol Use: No    Review of Systems  Constitutional: Negative for fever, chills and appetite change.  Respiratory: Negative for cough and shortness of breath.   Cardiovascular: Negative for chest pain.  Gastrointestinal: Negative for nausea, vomiting and diarrhea.  Genitourinary: Negative for urgency, frequency and difficulty urinating.  Neurological: Positive for dizziness and weakness.   Allergies  Review of patient's allergies indicates no known allergies.  Home Medications   Prior to Admission medications   Medication Sig Start Date End Date Taking? Authorizing Provider  acetaminophen (TYLENOL) 325 MG tablet Take 2 tablets (650 mg total) by mouth every 4 (four) hours as needed for headache or mild pain. 05/21/14  Yes Luke K Kilroy, PA-C  albuterol (PROVENTIL HFA;VENTOLIN HFA) 108 (90 BASE) MCG/ACT inhaler Inhale 2 puffs into the lungs every 6 (six) hours as needed for wheezing or shortness of breath.   Yes Historical Provider, MD  aspirin EC 81 MG tablet Take 81 mg by mouth daily.   Yes Historical Provider, MD  atorvastatin (LIPITOR) 40 MG tablet Take 1 tablet (40 mg total) by mouth daily at 6 PM. 05/29/13  Yes Scott T Kathlen Mody, PA-C   bismuth subsalicylate (PEPTO BISMOL) 262 MG/15ML suspension Take 30 mLs by mouth every 6 (six) hours as needed for indigestion or diarrhea or loose stools.    Yes Historical Provider, MD  BRILINTA 90 MG TABS tablet TAKE 1 TABLET BY MOUTH TWICE DAILY. 01/16/15  Yes Herminio Commons, MD  carvedilol (COREG) 3.125 MG tablet TAKE 1 TABLET BY MOUTH TWICE DAILY WITH MEALS. (HEART RATE & BLOOD PRESSURE) 03/05/15  Yes Lendon Colonel, NP  diphenoxylate-atropine (LOMOTIL) 2.5-0.025 MG per tablet Take 1 tablet by mouth 4 (four) times daily as needed for diarrhea or loose stools. 01/07/15  Yes Tanna Furry, MD  famotidine (PEPCID) 20 MG tablet Take 1 tablet (20 mg total) by mouth at bedtime. 03/03/15  Yes Rexene Alberts, MD  furosemide (LASIX) 20 MG tablet Take 20 mg by mouth as needed for fluid. Take 1 tablet as needed if 3 lb weight gain. 05/06/13  Yes Lendon Colonel, NP  glimepiride (AMARYL) 2 MG tablet FOR YOUR DIABETES WHILE OFF OF METFORMIN, TAKE 1 TABLET TODAY AND TOMORROW. 03/03/15  Yes Rexene Alberts, MD  isosorbide  mononitrate (IMDUR) 30 MG 24 hr tablet Take 1.5 tablets (45 mg total) by mouth daily. 03/03/15  Yes Rexene Alberts, MD  levothyroxine (SYNTHROID, LEVOTHROID) 50 MCG tablet Take 50 mcg by mouth daily before breakfast.  09/08/14  Yes Historical Provider, MD  metFORMIN (GLUCOPHAGE) 500 MG tablet Take 2 tablets (1,000 mg total) by mouth 2 (two) times daily with a meal. RESTART ON Sunday, 03/04/15. 03/03/15  Yes Rexene Alberts, MD  Multiple Vitamin (MULTIVITAMIN WITH MINERALS) TABS Take 1 tablet by mouth daily.   Yes Historical Provider, MD  nitroGLYCERIN (NITROSTAT) 0.4 MG SL tablet Place 0.4 mg under the tongue every 5 (five) minutes as needed for chest pain.   Yes Historical Provider, MD  oxyCODONE-acetaminophen (PERCOCET) 5-325 MG per tablet Take 1 tablet by mouth every 6 (six) hours as needed for moderate pain. 03/03/15  Yes Rexene Alberts, MD  pantoprazole (PROTONIX) 40 MG tablet Take 40 mg by mouth  daily.   Yes Historical Provider, MD  potassium chloride (K-DUR) 10 MEQ tablet Take 10 mEq by mouth as needed. Takes along with the Furosemide when he has swelling.   Yes Historical Provider, MD  simethicone (MYLICON) 80 MG chewable tablet Chew 80 mg by mouth every 6 (six) hours as needed for flatulence.   Yes Historical Provider, MD  tamsulosin (FLOMAX) 0.4 MG CAPS Take 0.4 mg by mouth daily after supper.  01/27/13  Yes Historical Provider, MD   Triage Vitals: BP 120/75 mmHg  Pulse 80  Temp(Src) 97.8 F (36.6 C) (Oral)  Resp 20  Ht 5\' 10"  (1.778 m)  Wt 170 lb (77.111 kg)  BMI 24.39 kg/m2  SpO2 100% Physical Exam  Constitutional: He is oriented to person, place, and time. He appears well-developed and well-nourished.  HENT:  Head: Normocephalic and atraumatic.  Eyes: EOM are normal.  Neck: Normal range of motion.  Cardiovascular: Normal rate, regular rhythm, normal heart sounds and intact distal pulses.   Pulmonary/Chest: Effort normal and breath sounds normal. No respiratory distress.  Abdominal: Soft. He exhibits no distension. There is no tenderness.  Musculoskeletal: Normal range of motion.  Neurological: He is alert and oriented to person, place, and time.  Skin: Skin is warm and dry.  31mm lesion on left lower lip; plaque on right forearm   Psychiatric: He has a normal mood and affect. Judgment normal.  Nursing note and vitals reviewed.   ED Course  Procedures (including critical care time) DIAGNOSTIC STUDIES: Oxygen Saturation is 100% on RA, nl by my interpretation.    COORDINATION OF CARE: 12:37 PM: Discussed treatment plan with pt at bedside; patient verbalizes understanding and agrees with treatment plan.  Labs Review Labs Reviewed  COMPREHENSIVE METABOLIC PANEL - Abnormal; Notable for the following:    Sodium 133 (*)    Glucose, Bld 224 (*)    All other components within normal limits  URINALYSIS, ROUTINE W REFLEX MICROSCOPIC (NOT AT [facility]) - Abnormal; Notable for  the following:    Glucose, UA 100 (*)    All other components within normal limits  CBC WITH DIFFERENTIAL/PLATELET  TROPONIN I    Imaging Review Dg Chest 2 View  03/16/2015   CLINICAL DATA:  81 year old male with weakness and dizziness for 1 week. Initial encounter. Former smoker  EXAM: CHEST  2 VIEW  COMPARISON:  03/02/2015 and earlier.  FINDINGS: Sequelae of CABG. Stable cardiac size and mediastinal contours. Calcified aortic atherosclerosis. Stable lung volumes. No pneumothorax, pulmonary edema, pleural effusion or confluent pulmonary opacity. Osteopenia. No acute  osseous abnormality identified.  IMPRESSION: No acute cardiopulmonary abnormality.   Electronically Signed   By: Genevie Ann M.D.   On: 03/16/2015 13:05   I have personally reviewed and evaluated these images and lab results as part of my medical decision-making.   EKG Interpretation   Date/Time:  Friday March 16 2015 12:19:29 EDT Ventricular Rate:  79 PR Interval:  178 QRS Duration: 112 QT Interval:  430 QTC Calculation: 493 R Axis:   87 Text Interpretation:  Sinus rhythm Borderline intraventricular conduction  delay Abnormal inferior Q waves Posterior infarct, old Nonspecific repol  abnormality, lateral leads ED PHYSICIAN INTERPRETATION AVAILABLE IN [place]  [facility] ED PHYSICIAN INTERPRETATION AVAILABLE IN [place]  Reconfirmed by TEST, Record ([id number]) on 03/17/2015 11:56:59 AM      MDM   Final diagnoses:  Lightheadedness    Patient presents with lightheadedness. Previous visits for same. Feels better after IV fluids. Lab work reassuring. Will discharge home.  I personally performed the services described in this documentation, which was scribed in my presence. The recorded information has been reviewed and is accurate.     Davonna Belling, MD 03/17/15 2145

## 2015-03-26 ENCOUNTER — Encounter (HOSPITAL_COMMUNITY): Payer: Self-pay | Admitting: Emergency Medicine

## 2015-03-26 ENCOUNTER — Emergency Department (HOSPITAL_COMMUNITY)
Admission: EM | Admit: 2015-03-26 | Discharge: 2015-03-27 | Disposition: A | Discharge status: Home / Self Care | Payer: Medicare Other | Attending: Emergency Medicine | Admitting: Emergency Medicine

## 2015-03-26 DIAGNOSIS — Z8781 Personal history of (healed) traumatic fracture: Secondary | ICD-10-CM | POA: Diagnosis not present

## 2015-03-26 DIAGNOSIS — R531 Weakness: Secondary | ICD-10-CM | POA: Insufficient documentation

## 2015-03-26 DIAGNOSIS — Z8701 Personal history of pneumonia (recurrent): Secondary | ICD-10-CM | POA: Diagnosis not present

## 2015-03-26 DIAGNOSIS — Z87891 Personal history of nicotine dependence: Secondary | ICD-10-CM | POA: Insufficient documentation

## 2015-03-26 DIAGNOSIS — Z79899 Other long term (current) drug therapy: Secondary | ICD-10-CM | POA: Insufficient documentation

## 2015-03-26 DIAGNOSIS — G8929 Other chronic pain: Secondary | ICD-10-CM | POA: Diagnosis not present

## 2015-03-26 DIAGNOSIS — Z951 Presence of aortocoronary bypass graft: Secondary | ICD-10-CM | POA: Insufficient documentation

## 2015-03-26 DIAGNOSIS — I10 Essential (primary) hypertension: Secondary | ICD-10-CM | POA: Diagnosis not present

## 2015-03-26 DIAGNOSIS — Z8711 Personal history of peptic ulcer disease: Secondary | ICD-10-CM | POA: Insufficient documentation

## 2015-03-26 DIAGNOSIS — K219 Gastro-esophageal reflux disease without esophagitis: Secondary | ICD-10-CM | POA: Diagnosis not present

## 2015-03-26 DIAGNOSIS — Z8673 Personal history of transient ischemic attack (TIA), and cerebral infarction without residual deficits: Secondary | ICD-10-CM | POA: Diagnosis not present

## 2015-03-26 DIAGNOSIS — I2510 Atherosclerotic heart disease of native coronary artery without angina pectoris: Secondary | ICD-10-CM | POA: Diagnosis not present

## 2015-03-26 DIAGNOSIS — Z9861 Coronary angioplasty status: Secondary | ICD-10-CM | POA: Insufficient documentation

## 2015-03-26 DIAGNOSIS — E039 Hypothyroidism, unspecified: Secondary | ICD-10-CM | POA: Insufficient documentation

## 2015-03-26 DIAGNOSIS — E785 Hyperlipidemia, unspecified: Secondary | ICD-10-CM | POA: Insufficient documentation

## 2015-03-26 DIAGNOSIS — Z7982 Long term (current) use of aspirin: Secondary | ICD-10-CM | POA: Insufficient documentation

## 2015-03-26 DIAGNOSIS — Z9889 Other specified postprocedural states: Secondary | ICD-10-CM | POA: Diagnosis not present

## 2015-03-26 DIAGNOSIS — E860 Dehydration: Secondary | ICD-10-CM | POA: Insufficient documentation

## 2015-03-26 DIAGNOSIS — N400 Enlarged prostate without lower urinary tract symptoms: Secondary | ICD-10-CM | POA: Diagnosis not present

## 2015-03-26 DIAGNOSIS — E119 Type 2 diabetes mellitus without complications: Secondary | ICD-10-CM | POA: Insufficient documentation

## 2015-03-26 LAB — BASIC METABOLIC PANEL
Anion gap: 7 (ref 5–15)
BUN: 11 mg/dL (ref 6–20)
CALCIUM: 9 mg/dL (ref 8.9–10.3)
CO2: 26 mmol/L (ref 22–32)
CREATININE: 0.69 mg/dL (ref 0.61–1.24)
Chloride: 106 mmol/L (ref 101–111)
GFR calc non Af Amer: >60 mL/min (ref >60)
Glucose, Bld: 175 mg/dL — ABNORMAL HIGH (ref 65–99)
Potassium: 3.8 mmol/L (ref 3.5–5.1)
Sodium: 139 mmol/L (ref 135–145)

## 2015-03-26 LAB — CBC
HCT: 40 % (ref 39.0–52.0)
Hemoglobin: 13.7 g/dL (ref 13.0–17.0)
MCH: 32.3 pg (ref 26.0–34.0)
MCHC: 34.3 g/dL (ref 30.0–36.0)
MCV: 94.3 fL (ref 78.0–100.0)
PLATELETS: 148 10*3/uL — AB (ref 150–400)
RBC: 4.24 MIL/uL (ref 4.22–5.81)
RDW: 12.8 % (ref 11.5–15.5)
WBC: 6.7 10*3/uL (ref 4.0–10.5)

## 2015-03-26 LAB — CBG MONITORING, ED
GLUCOSE-CAPILLARY: 191 mg/dL — AB (ref 65–99)
Glucose-Capillary: 172 mg/dL — ABNORMAL HIGH (ref 65–99)

## 2015-03-26 NOTE — Discharge Instructions (Signed)
Recheck as needed °

## 2015-03-26 NOTE — ED Notes (Signed)
Pt states he felt " rough" got hot today, took his BP at home 185/98 HR 98 so he came to ED to be evaluated

## 2015-03-27 ENCOUNTER — Encounter (HOSPITAL_COMMUNITY): Payer: Self-pay

## 2015-03-27 ENCOUNTER — Emergency Department (HOSPITAL_COMMUNITY): Payer: Medicare Other

## 2015-03-27 ENCOUNTER — Emergency Department (HOSPITAL_COMMUNITY)
Admission: EM | Admit: 2015-03-27 | Discharge: 2015-03-27 | Disposition: A | Discharge status: Home / Self Care | Payer: Medicare Other | Source: Home / Self Care | Attending: Emergency Medicine | Admitting: Emergency Medicine

## 2015-03-27 DIAGNOSIS — R531 Weakness: Secondary | ICD-10-CM | POA: Diagnosis not present

## 2015-03-27 DIAGNOSIS — E860 Dehydration: Secondary | ICD-10-CM

## 2015-03-27 LAB — I-STAT CHEM 8, ED
BUN: 9 mg/dL (ref 6–20)
CALCIUM ION: 1.19 mmol/L (ref 1.13–1.30)
CHLORIDE: 102 mmol/L (ref 101–111)
CREATININE: 0.7 mg/dL (ref 0.61–1.24)
GLUCOSE: 220 mg/dL — AB (ref 65–99)
HCT: 41 % (ref 39.0–52.0)
Hemoglobin: 13.9 g/dL (ref 13.0–17.0)
POTASSIUM: 3.9 mmol/L (ref 3.5–5.1)
Sodium: 135 mmol/L (ref 135–145)
TCO2: 24 mmol/L (ref 0–100)

## 2015-03-27 LAB — CBG MONITORING, ED: Glucose-Capillary: 256 mg/dL — ABNORMAL HIGH (ref 65–99)

## 2015-03-27 MED ORDER — SODIUM CHLORIDE 0.9 % IV BOLUS (SEPSIS)
1000.0000 mL | Freq: Once | INTRAVENOUS | Status: AC
  Administered 2015-03-27: 1000 mL via INTRAVENOUS

## 2015-03-27 NOTE — ED Notes (Signed)
Pt c/o generalized weakness, was here last night for same.  Pt says is no better.

## 2015-03-27 NOTE — ED Provider Notes (Signed)
CSN: [id number]     Arrival date & time 03/27/15  1010 History  This chart was scribed for Jeffrey Schmidt, MD by Hilda Lias, ED Scribe. This patient was seen in room APA11/APA11 and the patient's care was started at 12:10 PM.  Chief Complaint  Patient presents with  . Fatigue      The history is provided by the patient. No language interpreter was used.   HPI Comments: OLANDO WILLEMS is a 81 y.o. male who presents to the Emergency Department complaining of constant weakness that has been present since yesterday. Pt states that yesterday around lunch time he began to have generalized weakness and noticed that his blood pressure was high around 185/90. Pt states that he came to ED and was here until 12 AM last night. Pt went home this morning and his blood pressure was very low, and states he has had weakness the whole time. Pt states he has not eaten very much at all in the past couple of days. Pt denies cough or SOB.    Past Medical History  Diagnosis Date  . Diabetes mellitus, type II   . Hyperlipidemia   . Coronary atherosclerosis of native coronary artery     a. CABG x 4 in 1989 (VG->OM1->OM2, VG->RCA, LIMA->LAD), b. 05/2010: DES to VG-OM1/OM2, DES to distal LCx. c. NSTEMI in 04/2011 - TO distal LCX stent and VG->OM2. d. 02/2012 NSTEMI DES to VG-OM1/continuation to OM2 occluded. e. inferior STEMI s/p DES to SVG-RAMUS 06/2012. f. inferolat STEMI 09/2012 s/p DES to SVG-interm; g. Lex MV (11/14):  EF 35%, inf-lat scar with small peri-infarct ischemia  . Essential hypertension, benign   . Osteoarthritis   . History of stroke   . History of pneumonia   . Cervical vertebral fracture   . Chronic back pain   . Benign prostatic hypertrophy     History of urinary retention  . Peptic ulcer disease   . Gastroesophageal reflux disease   . Ischemic cardiomyopathy Nov 2015    EF 35% cath, 45-50% by echo  . Hypothyroidism    Past Surgical History  Procedure Laterality Date  . Tonsillectomy     . Coronary artery bypass graft  1989  . Coronary angioplasty  10/12, 8/13, 12/13, 3/14    SVG-OM PCI  . Cardiac catheterization  05/19/14    SVG-OM occl- medical Rx  . Left heart catheterization with coronary angiogram N/A 11/01/2011    Procedure: LEFT HEART CATHETERIZATION WITH CORONARY ANGIOGRAM;  Surgeon: Lorretta Harp, MD;  Location: [facility] CATH LAB;  Service: Cardiovascular;  Laterality: N/A;  . Percutaneous coronary stent intervention (pci-s) N/A 11/01/2011    Procedure: PERCUTANEOUS CORONARY STENT INTERVENTION (PCI-S);  Surgeon: Lorretta Harp, MD;  Location: [facility] CATH LAB;  Service: Cardiovascular;  Laterality: N/A;  . Left heart catheterization with coronary/graft angiogram N/A 03/10/2012    Procedure: LEFT HEART CATHETERIZATION WITH [name];  Surgeon: Sherren Mocha, MD;  Location: [facility] CATH LAB;  Service: Cardiovascular;  Laterality: N/A;  . Left heart catheterization with coronary angiogram N/A 06/18/2012    Procedure: LEFT HEART CATHETERIZATION WITH CORONARY ANGIOGRAM;  Surgeon: Peter M Martinique, MD;  Location: [facility] CATH LAB;  Service: Cardiovascular;  Laterality: N/A;  . Percutaneous coronary stent intervention (pci-s)  06/18/2012    Procedure: PERCUTANEOUS CORONARY STENT INTERVENTION (PCI-S);  Surgeon: Peter M Martinique, MD;  Location: [facility] CATH LAB;  Service: Cardiovascular;;  . Left heart catheterization with coronary/graft angiogram  10/06/2012    Procedure: LEFT HEART CATHETERIZATION  WITH [name];  Surgeon: Burnell Blanks, MD;  Location: [facility] CATH LAB;  Service: Cardiovascular;;  . Percutaneous coronary stent intervention (pci-s)  10/06/2012    Procedure: PERCUTANEOUS CORONARY STENT INTERVENTION (PCI-S);  Surgeon: Burnell Blanks, MD;  Location: [facility] CATH LAB;  Service: Cardiovascular;;  . Left heart catheterization with coronary/graft angiogram N/A 11/09/2012    Procedure: LEFT HEART CATHETERIZATION WITH [name];  Surgeon: Peter M  Martinique, MD;  Location: [facility] CATH LAB;  Service: Cardiovascular;  Laterality: N/A;  . Left heart catheterization with coronary/graft angiogram N/A 05/19/2014    Procedure: LEFT HEART CATHETERIZATION WITH [name];  Surgeon: Troy Sine, MD;  Location: [facility] CATH LAB;  Service: Cardiovascular;  Laterality: N/A;   Family History  Problem Relation Age of Onset  . Early death      Parents died young  . Appendicitis Mother     Pt was 57 year old  . Heart attack Father 51   Social History  Substance Use Topics  . Smoking status: Former Smoker -- 2.00 packs/day for 10 years    Types: Cigarettes    Start date: 07/14/1950    Quit date: 07/14/1961  . Smokeless tobacco: Current User    Types: Chew  . Alcohol Use: No    Review of Systems  A complete 10 system review of systems was obtained and all systems are negative except as noted in the HPI and PMH.    Allergies  Review of patient's allergies indicates no known allergies.  Home Medications   Prior to Admission medications   Medication Sig Start Date End Date Taking? Authorizing Provider  acetaminophen (TYLENOL) 325 MG tablet Take 2 tablets (650 mg total) by mouth every 4 (four) hours as needed for headache or mild pain. 05/21/14  Yes Luke K Kilroy, PA-C  albuterol (PROVENTIL HFA;VENTOLIN HFA) 108 (90 BASE) MCG/ACT inhaler Inhale 2 puffs into the lungs every 6 (six) hours as needed for wheezing or shortness of breath.   Yes Historical Provider, MD  aspirin EC 81 MG tablet Take 81 mg by mouth daily.   Yes Historical Provider, MD  atorvastatin (LIPITOR) 40 MG tablet Take 1 tablet (40 mg total) by mouth daily at 6 PM. 05/29/13  Yes Scott T Kathlen Mody, PA-C  bismuth subsalicylate (PEPTO BISMOL) 262 MG/15ML suspension Take 30 mLs by mouth every 6 (six) hours as needed for indigestion or diarrhea or loose stools.    Yes Historical Provider, MD  BRILINTA 90 MG TABS tablet TAKE 1 TABLET BY MOUTH TWICE DAILY. 01/16/15  Yes Herminio Commons, MD  carvedilol (COREG) 3.125 MG tablet TAKE 1 TABLET BY MOUTH TWICE DAILY WITH MEALS. (HEART RATE & BLOOD PRESSURE) 03/05/15  Yes Lendon Colonel, NP  diphenoxylate-atropine (LOMOTIL) 2.5-0.025 MG per tablet Take 1 tablet by mouth 4 (four) times daily as needed for diarrhea or loose stools. 01/07/15  Yes Tanna Furry, MD  famotidine (PEPCID) 20 MG tablet Take 1 tablet (20 mg total) by mouth at bedtime. 03/03/15  Yes Rexene Alberts, MD  furosemide (LASIX) 20 MG tablet Take 20 mg by mouth as needed for fluid. Take 1 tablet as needed if 3 lb weight gain. 05/06/13  Yes Lendon Colonel, NP  isosorbide mononitrate (IMDUR) 30 MG 24 hr tablet Take 1.5 tablets (45 mg total) by mouth daily. 03/03/15  Yes Rexene Alberts, MD  levothyroxine (SYNTHROID, LEVOTHROID) 50 MCG tablet Take 50 mcg by mouth daily before breakfast.  09/08/14  Yes Historical Provider, MD  metFORMIN (GLUCOPHAGE) 500 MG tablet Take 2 tablets (1,000 mg total) by mouth 2 (two) times daily with a meal. RESTART ON Sunday, 03/04/15. 03/03/15  Yes Rexene Alberts, MD  Multiple Vitamin (MULTIVITAMIN WITH MINERALS) TABS Take 1 tablet by mouth daily.   Yes Historical Provider, MD  nitroGLYCERIN (NITROSTAT) 0.4 MG SL tablet Place 0.4 mg under the tongue every 5 (five) minutes as needed for chest pain.   Yes Historical Provider, MD  oxyCODONE-acetaminophen (PERCOCET) 5-325 MG per tablet Take 1 tablet by mouth every 6 (six) hours as needed for moderate pain. 03/03/15  Yes Rexene Alberts, MD  potassium chloride (K-DUR) 10 MEQ tablet Take 10 mEq by mouth as needed. Takes along with the Furosemide when he has swelling.   Yes Historical Provider, MD  simethicone (MYLICON) 80 MG chewable tablet Chew 80 mg by mouth every 6 (six) hours as needed for flatulence.   Yes Historical Provider, MD  tamsulosin (FLOMAX) 0.4 MG CAPS Take 0.4 mg by mouth daily after supper.  01/27/13  Yes Historical Provider, MD  glimepiride (AMARYL) 2 MG tablet FOR YOUR DIABETES WHILE  OFF OF METFORMIN, TAKE 1 TABLET TODAY AND TOMORROW. Patient not taking: Reported on 03/27/2015 03/03/15   Rexene Alberts, MD   BP 114/62 mmHg  Pulse 68  Temp(Src) 97.7 F (36.5 C) (Oral)  Resp 20  SpO2 96% Physical Exam  Constitutional: He is oriented to person, place, and time. He appears well-developed and well-nourished.  HENT:  Head: Normocephalic and atraumatic.  Eyes: EOM are normal.  Neck: Normal range of motion.  Cardiovascular: Normal rate, regular rhythm, normal heart sounds and intact distal pulses.   Pulmonary/Chest: Effort normal and breath sounds normal. No respiratory distress.  Abdominal: Soft. He exhibits no distension. There is no tenderness.  Musculoskeletal: Normal range of motion.  Neurological: He is alert and oriented to person, place, and time.  Skin: Skin is warm and dry.  Psychiatric: He has a normal mood and affect. Judgment normal.  Nursing note and vitals reviewed.   ED Course  Procedures (including critical care time)  DIAGNOSTIC STUDIES: Oxygen Saturation is 96% on room air, normal by my interpretation.    COORDINATION OF CARE: 12:16 PM Discussed treatment plan with pt at bedside and pt agreed to plan.   Labs Review Labs Reviewed  CBG MONITORING, ED - Abnormal; Notable for the following:    Glucose-Capillary 256 (*)    All other components within normal limits    Imaging Review No results found. I have personally reviewed and evaluated these images and lab results as part of my medical decision-making.   EKG Interpretation None      MDM   Final diagnoses:  Dehydration  Weakness    1:38 PM Patient feels much better after IV fluids.  Discharge home in good condition.  Likely mild dehydration.  Primary care follow-up.  I personally performed the services described in this documentation, which was scribed in my presence. The recorded information has been reviewed and is accurate.      Jeffrey Schmidt, MD 03/27/15 [phone]

## 2015-03-27 NOTE — ED Provider Notes (Signed)
CSN: [id number]     Arrival date & time 03/26/15  1552 History   First MD Initiated Contact with Patient 03/26/15 2230    Chief Complaint  Patient presents with  . Weakness     (Consider location/radiation/quality/duration/timing/severity/associated sxs/prior Treatment) HPI patient reports before lunch he was outside in the heat. He states after lunch she started feeling bad. He at first said he was only outside for short period of time and then he said it was 3 hours. He states he was shopping and doing errands today. He states when he was driving in his car he did not turn on the air conditioning. He states he was sweating a lot. He can only tell me he "felt bad" and felt sick. He denies nausea, vomiting, dizziness, chest pain, shortness of breath, or diarrhea. He states he has anxiety and mental problems and he thinks sometimes that makes his symptoms worse. Patient reports he is feeling better after a prolonged wait in the waiting room waiting to be seen. Patient states when he was feeling bad his blood pressure was 190/98 and his heart rate was 99.   PCP Dr Gerarda Fraction  Past Medical History  Diagnosis Date  . Diabetes mellitus, type II   . Hyperlipidemia   . Coronary atherosclerosis of native coronary artery     a. CABG x 4 in 1989 (VG->OM1->OM2, VG->RCA, LIMA->LAD), b. 05/2010: DES to VG-OM1/OM2, DES to distal LCx. c. NSTEMI in 04/2011 - TO distal LCX stent and VG->OM2. d. 02/2012 NSTEMI DES to VG-OM1/continuation to OM2 occluded. e. inferior STEMI s/p DES to SVG-RAMUS 06/2012. f. inferolat STEMI 09/2012 s/p DES to SVG-interm; g. Lex MV (11/14):  EF 35%, inf-lat scar with small peri-infarct ischemia  . Essential hypertension, benign   . Osteoarthritis   . History of stroke   . History of pneumonia   . Cervical vertebral fracture   . Chronic back pain   . Benign prostatic hypertrophy     History of urinary retention  . Peptic ulcer disease   . Gastroesophageal reflux disease   . Ischemic  cardiomyopathy Nov 2015    EF 35% cath, 45-50% by echo  . Hypothyroidism    Past Surgical History  Procedure Laterality Date  . Tonsillectomy    . Coronary artery bypass graft  1989  . Coronary angioplasty  10/12, 8/13, 12/13, 3/14    SVG-OM PCI  . Cardiac catheterization  05/19/14    SVG-OM occl- medical Rx  . Left heart catheterization with coronary angiogram N/A 11/01/2011    Procedure: LEFT HEART CATHETERIZATION WITH CORONARY ANGIOGRAM;  Surgeon: Lorretta Harp, MD;  Location: [facility] CATH LAB;  Service: Cardiovascular;  Laterality: N/A;  . Percutaneous coronary stent intervention (pci-s) N/A 11/01/2011    Procedure: PERCUTANEOUS CORONARY STENT INTERVENTION (PCI-S);  Surgeon: Lorretta Harp, MD;  Location: [facility] CATH LAB;  Service: Cardiovascular;  Laterality: N/A;  . Left heart catheterization with coronary/graft angiogram N/A 03/10/2012    Procedure: LEFT HEART CATHETERIZATION WITH [name];  Surgeon: Sherren Mocha, MD;  Location: [facility] CATH LAB;  Service: Cardiovascular;  Laterality: N/A;  . Left heart catheterization with coronary angiogram N/A 06/18/2012    Procedure: LEFT HEART CATHETERIZATION WITH CORONARY ANGIOGRAM;  Surgeon: Peter M Martinique, MD;  Location: [facility] CATH LAB;  Service: Cardiovascular;  Laterality: N/A;  . Percutaneous coronary stent intervention (pci-s)  06/18/2012    Procedure: PERCUTANEOUS CORONARY STENT INTERVENTION (PCI-S);  Surgeon: Peter M Martinique, MD;  Location: [facility] CATH LAB;  Service: Cardiovascular;;  .  Left heart catheterization with coronary/graft angiogram  10/06/2012    Procedure: LEFT HEART CATHETERIZATION WITH [name];  Surgeon: Burnell Blanks, MD;  Location: [facility] CATH LAB;  Service: Cardiovascular;;  . Percutaneous coronary stent intervention (pci-s)  10/06/2012    Procedure: PERCUTANEOUS CORONARY STENT INTERVENTION (PCI-S);  Surgeon: Burnell Blanks, MD;  Location: [facility] CATH LAB;  Service: Cardiovascular;;  . Left heart  catheterization with coronary/graft angiogram N/A 11/09/2012    Procedure: LEFT HEART CATHETERIZATION WITH [name];  Surgeon: Peter M Martinique, MD;  Location: [facility] CATH LAB;  Service: Cardiovascular;  Laterality: N/A;  . Left heart catheterization with coronary/graft angiogram N/A 05/19/2014    Procedure: LEFT HEART CATHETERIZATION WITH [name];  Surgeon: Troy Sine, MD;  Location: [facility] CATH LAB;  Service: Cardiovascular;  Laterality: N/A;   Family History  Problem Relation Age of Onset  . Early death      Parents died young  . Appendicitis Mother     Pt was 71 year old  . Heart attack Father 57   Social History  Substance Use Topics  . Smoking status: Former Smoker -- 2.00 packs/day for 10 years    Types: Cigarettes    Start date: 07/14/1950    Quit date: 07/14/1961  . Smokeless tobacco: Current User    Types: Chew  . Alcohol Use: No  lives at home  Review of Systems  All other systems reviewed and are negative.     Allergies  Review of patient's allergies indicates no known allergies.  Home Medications   Prior to Admission medications   Medication Sig Start Date End Date Taking? Authorizing Provider  acetaminophen (TYLENOL) 325 MG tablet Take 2 tablets (650 mg total) by mouth every 4 (four) hours as needed for headache or mild pain. 05/21/14   Erlene Quan, PA-C  albuterol (PROVENTIL HFA;VENTOLIN HFA) 108 (90 BASE) MCG/ACT inhaler Inhale 2 puffs into the lungs every 6 (six) hours as needed for wheezing or shortness of breath.    Historical Provider, MD  aspirin EC 81 MG tablet Take 81 mg by mouth daily.    Historical Provider, MD  atorvastatin (LIPITOR) 40 MG tablet Take 1 tablet (40 mg total) by mouth daily at 6 PM. 05/29/13   Liliane Shi, PA-C  bismuth subsalicylate (PEPTO BISMOL) 262 MG/15ML suspension Take 30 mLs by mouth every 6 (six) hours as needed for indigestion or diarrhea or loose stools.     Historical Provider, MD  BRILINTA 90  MG TABS tablet TAKE 1 TABLET BY MOUTH TWICE DAILY. 01/16/15   Herminio Commons, MD  carvedilol (COREG) 3.125 MG tablet TAKE 1 TABLET BY MOUTH TWICE DAILY WITH MEALS. (HEART RATE & BLOOD PRESSURE) 03/05/15   Lendon Colonel, NP  diphenoxylate-atropine (LOMOTIL) 2.5-0.025 MG per tablet Take 1 tablet by mouth 4 (four) times daily as needed for diarrhea or loose stools. 01/07/15   Tanna Furry, MD  famotidine (PEPCID) 20 MG tablet Take 1 tablet (20 mg total) by mouth at bedtime. 03/03/15   Rexene Alberts, MD  furosemide (LASIX) 20 MG tablet Take 20 mg by mouth as needed for fluid. Take 1 tablet as needed if 3 lb weight gain. 05/06/13   Lendon Colonel, NP  glimepiride (AMARYL) 2 MG tablet FOR YOUR DIABETES WHILE OFF OF METFORMIN, TAKE 1 TABLET TODAY AND TOMORROW. 03/03/15   Rexene Alberts, MD  isosorbide mononitrate (IMDUR) 30 MG 24 hr tablet Take 1.5 tablets (45 mg total) by mouth daily. 03/03/15  Rexene Alberts, MD  levothyroxine (SYNTHROID, LEVOTHROID) 50 MCG tablet Take 50 mcg by mouth daily before breakfast.  09/08/14   Historical Provider, MD  metFORMIN (GLUCOPHAGE) 500 MG tablet Take 2 tablets (1,000 mg total) by mouth 2 (two) times daily with a meal. RESTART ON Sunday, 03/04/15. 03/03/15   Rexene Alberts, MD  Multiple Vitamin (MULTIVITAMIN WITH MINERALS) TABS Take 1 tablet by mouth daily.    Historical Provider, MD  nitroGLYCERIN (NITROSTAT) 0.4 MG SL tablet Place 0.4 mg under the tongue every 5 (five) minutes as needed for chest pain.    Historical Provider, MD  oxyCODONE-acetaminophen (PERCOCET) 5-325 MG per tablet Take 1 tablet by mouth every 6 (six) hours as needed for moderate pain. 03/03/15   Rexene Alberts, MD  pantoprazole (PROTONIX) 40 MG tablet Take 40 mg by mouth daily.    Historical Provider, MD  potassium chloride (K-DUR) 10 MEQ tablet Take 10 mEq by mouth as needed. Takes along with the Furosemide when he has swelling.    Historical Provider, MD  simethicone (MYLICON) 80 MG chewable tablet  Chew 80 mg by mouth every 6 (six) hours as needed for flatulence.    Historical Provider, MD  tamsulosin (FLOMAX) 0.4 MG CAPS Take 0.4 mg by mouth daily after supper.  01/27/13   Historical Provider, MD   BP 165/78 mmHg  Pulse 70  Temp(Src) 97.4 F (36.3 C) (Oral)  Resp 16  Ht 5\' 10"  (1.778 m)  Wt 170 lb (77.111 kg)  BMI 24.39 kg/m2  SpO2 100%  Vital signs normal except hypertension  Physical Exam  Constitutional: He is oriented to person, place, and time. He appears well-developed and well-nourished.  Non-toxic appearance. He does not appear ill. No distress.  HENT:  Head: Normocephalic and atraumatic.  Right Ear: External ear normal.  Left Ear: External ear normal.  Nose: Nose normal. No mucosal edema or rhinorrhea.  Mouth/Throat: Oropharynx is clear and moist and mucous membranes are normal. No dental abscesses or uvula swelling.  Eyes: Conjunctivae and EOM are normal. Pupils are equal, round, and reactive to light.  Neck: Normal range of motion and full passive range of motion without pain. Neck supple.  Cardiovascular: Normal rate, regular rhythm and normal heart sounds.  Exam reveals no gallop and no friction rub.   No murmur heard. Pulmonary/Chest: Effort normal and breath sounds normal. No respiratory distress. He has no wheezes. He has no rhonchi. He has no rales. He exhibits no tenderness and no crepitus.  Abdominal: Soft. Normal appearance and bowel sounds are normal. He exhibits no distension. There is no tenderness. There is no rebound and no guarding.  Musculoskeletal: Normal range of motion. He exhibits no edema or tenderness.  Moves all extremities well.   Neurological: He is alert and oriented to person, place, and time. He has normal strength. No cranial nerve deficit.  Skin: Skin is warm, dry and intact. No rash noted. No erythema. No pallor.  Psychiatric: He has a normal mood and affect. His speech is normal and behavior is normal. His mood appears not anxious.   Nursing note and vitals reviewed.   ED Course  Procedures (including critical care time)  Patient has been a frequent ED visitor for years. He has some very nonspecific complaints tonight. He is feeling better. Patient was discharged after all his laboratory tests returned and were noncritical.  Labs Review Results for orders placed or performed during the hospital encounter of 16/10/96  Basic metabolic panel  Result Value Ref Range  Sodium 139 135 - 145 mmol/L   Potassium 3.8 3.5 - 5.1 mmol/L   Chloride 106 101 - 111 mmol/L   CO2 26 22 - 32 mmol/L   Glucose, Bld 175 (H) 65 - 99 mg/dL   BUN 11 6 - 20 mg/dL   Creatinine, Ser 0.69 0.61 - 1.24 mg/dL   Calcium 9.0 8.9 - 10.3 mg/dL   GFR calc non Af Amer >60 >60 mL/min   GFR calc Af Amer >60 >60 mL/min   Anion gap 7 5 - 15  CBC  Result Value Ref Range   WBC 6.7 4.0 - 10.5 K/uL   RBC 4.24 4.22 - 5.81 MIL/uL   Hemoglobin 13.7 13.0 - 17.0 g/dL   HCT 40.0 39.0 - 52.0 %   MCV 94.3 78.0 - 100.0 fL   MCH 32.3 26.0 - 34.0 pg   MCHC 34.3 30.0 - 36.0 g/dL   RDW 12.8 11.5 - 15.5 %   Platelets 148 (L) 150 - 400 K/uL  CBG monitoring, ED  Result Value Ref Range   Glucose-Capillary 191 (H) 65 - 99 mg/dL   Comment 1 Document in Chart   CBG monitoring, ED  Result Value Ref Range   Glucose-Capillary 172 (H) 65 - 99 mg/dL   Comment 1 Notify RN    Comment 2 Document in Chart    Laboratory interpretation all normal except hyperglycemia  Imaging Review No results found.     Dg Chest 2 View  03/16/2015   CLINICAL DATA:  81 year old male with weakness and dizziness for 1 week. Initial encounter. Former smoker   IMPRESSION: No acute cardiopulmonary abnormality.   Electronically Signed   By: Genevie Ann M.D.   On: 03/16/2015 13:05   Dg Chest 2 View  03/02/2015   CLINICAL DATA:  Chest pain    IMPRESSION: No active cardiopulmonary disease.   Electronically Signed   By: Marybelle Killings M.D.   On: 03/02/2015 10:16   Ct Abdomen Pelvis W  Contrast  03/02/2015   CLINICAL DATA:  Mid abdominal pain began this morning. IMPRESSION: 1. No acute abdominal/pelvic findings, mass lesions or lymphadenopathy. 2. Remote ununited lower left rib fractures. 3. Advanced atherosclerotic calcifications involving the abdominal aorta but no aneurysm or dissection. 4. Sigmoid diverticulosis without findings for acute diverticulitis. 5. Enlarged prostate gland with median lobe hypertrophy impressing on the base of the bladder and likely causing partial bladder outlet obstruction.   Electronically Signed   By: Marijo Sanes M.D.   On: 03/02/2015 11:17  I have personally reviewed and evaluated these images and lab results as part of my medical decision-making.   EKG Interpretation   Date/Time:  Monday March 26 2015 22:30:23 EDT Ventricular Rate:  64 PR Interval:  199 QRS Duration: 112 QT Interval:  470 QTC Calculation: 485 R Axis:   90 Text Interpretation:  Sinus rhythm Borderline intraventricular conduction  delay Posterior infarct, old Nonspecific repol abnormality, diffuse leads  No significant change since last tracing 26 Mar 2015 Confirmed by [facility]   MD-I, Hanni Milford ([id number]) on 03/26/2015 10:35:29 PM      MDM   Final diagnoses:  Weakness    Plan discharge  Rolland Porter, MD, Barbette Or, MD 03/27/15 [phone]

## 2015-03-30 ENCOUNTER — Encounter (HOSPITAL_COMMUNITY): Payer: Self-pay

## 2015-03-30 ENCOUNTER — Ambulatory Visit (INDEPENDENT_AMBULATORY_CARE_PROVIDER_SITE_OTHER): Payer: Medicare Other | Admitting: Adult Health

## 2015-03-30 ENCOUNTER — Emergency Department (HOSPITAL_COMMUNITY)
Admission: EM | Admit: 2015-03-30 | Discharge: 2015-03-30 | Disposition: A | Discharge status: Home / Self Care | Payer: Medicare Other | Attending: Emergency Medicine | Admitting: Emergency Medicine

## 2015-03-30 ENCOUNTER — Encounter: Payer: Self-pay | Admitting: Adult Health

## 2015-03-30 ENCOUNTER — Emergency Department (HOSPITAL_COMMUNITY): Payer: Medicare Other

## 2015-03-30 VITALS — BP 110/70 | HR 94 | Ht 70.0 in | Wt 174.8 lb

## 2015-03-30 DIAGNOSIS — Z8673 Personal history of transient ischemic attack (TIA), and cerebral infarction without residual deficits: Secondary | ICD-10-CM | POA: Diagnosis not present

## 2015-03-30 DIAGNOSIS — Z79899 Other long term (current) drug therapy: Secondary | ICD-10-CM | POA: Diagnosis not present

## 2015-03-30 DIAGNOSIS — M1990 Unspecified osteoarthritis, unspecified site: Secondary | ICD-10-CM | POA: Diagnosis not present

## 2015-03-30 DIAGNOSIS — Z8781 Personal history of (healed) traumatic fracture: Secondary | ICD-10-CM | POA: Diagnosis not present

## 2015-03-30 DIAGNOSIS — I2510 Atherosclerotic heart disease of native coronary artery without angina pectoris: Secondary | ICD-10-CM | POA: Diagnosis not present

## 2015-03-30 DIAGNOSIS — Z7982 Long term (current) use of aspirin: Secondary | ICD-10-CM | POA: Insufficient documentation

## 2015-03-30 DIAGNOSIS — Z9889 Other specified postprocedural states: Secondary | ICD-10-CM | POA: Diagnosis not present

## 2015-03-30 DIAGNOSIS — F418 Other specified anxiety disorders: Secondary | ICD-10-CM

## 2015-03-30 DIAGNOSIS — G8929 Other chronic pain: Secondary | ICD-10-CM | POA: Diagnosis not present

## 2015-03-30 DIAGNOSIS — E119 Type 2 diabetes mellitus without complications: Secondary | ICD-10-CM | POA: Diagnosis not present

## 2015-03-30 DIAGNOSIS — Z951 Presence of aortocoronary bypass graft: Secondary | ICD-10-CM | POA: Diagnosis not present

## 2015-03-30 DIAGNOSIS — E785 Hyperlipidemia, unspecified: Secondary | ICD-10-CM | POA: Diagnosis not present

## 2015-03-30 DIAGNOSIS — Z8711 Personal history of peptic ulcer disease: Secondary | ICD-10-CM | POA: Diagnosis not present

## 2015-03-30 DIAGNOSIS — Z8701 Personal history of pneumonia (recurrent): Secondary | ICD-10-CM | POA: Insufficient documentation

## 2015-03-30 DIAGNOSIS — Z9861 Coronary angioplasty status: Secondary | ICD-10-CM | POA: Diagnosis not present

## 2015-03-30 DIAGNOSIS — Z8719 Personal history of other diseases of the digestive system: Secondary | ICD-10-CM | POA: Insufficient documentation

## 2015-03-30 DIAGNOSIS — E039 Hypothyroidism, unspecified: Secondary | ICD-10-CM | POA: Diagnosis not present

## 2015-03-30 DIAGNOSIS — Z87891 Personal history of nicotine dependence: Secondary | ICD-10-CM | POA: Diagnosis not present

## 2015-03-30 DIAGNOSIS — R200 Anesthesia of skin: Secondary | ICD-10-CM | POA: Insufficient documentation

## 2015-03-30 DIAGNOSIS — I10 Essential (primary) hypertension: Secondary | ICD-10-CM | POA: Diagnosis not present

## 2015-03-30 DIAGNOSIS — N400 Enlarged prostate without lower urinary tract symptoms: Secondary | ICD-10-CM | POA: Insufficient documentation

## 2015-03-30 LAB — CBC WITH DIFFERENTIAL/PLATELET
BASOS ABS: 0 10*3/uL (ref 0.0–0.1)
BASOS PCT: 0 %
Eosinophils Absolute: 0.2 10*3/uL (ref 0.0–0.7)
Eosinophils Relative: 3 %
HEMATOCRIT: 38.3 % — AB (ref 39.0–52.0)
HEMOGLOBIN: 13.1 g/dL (ref 13.0–17.0)
Lymphocytes Relative: 21 %
Lymphs Abs: 1.3 10*3/uL (ref 0.7–4.0)
MCH: 32.3 pg (ref 26.0–34.0)
MCHC: 34.2 g/dL (ref 30.0–36.0)
MCV: 94.3 fL (ref 78.0–100.0)
MONOS PCT: 8 %
Monocytes Absolute: 0.5 10*3/uL (ref 0.1–1.0)
NEUTROS ABS: 4.1 10*3/uL (ref 1.7–7.7)
NEUTROS PCT: 68 %
Platelets: 142 10*3/uL — ABNORMAL LOW (ref 150–400)
RBC: 4.06 MIL/uL — ABNORMAL LOW (ref 4.22–5.81)
RDW: 12.6 % (ref 11.5–15.5)
WBC: 6.1 10*3/uL (ref 4.0–10.5)

## 2015-03-30 LAB — BASIC METABOLIC PANEL
ANION GAP: 6 (ref 5–15)
BUN: 11 mg/dL (ref 6–20)
CHLORIDE: 104 mmol/L (ref 101–111)
CO2: 25 mmol/L (ref 22–32)
Calcium: 8.6 mg/dL — ABNORMAL LOW (ref 8.9–10.3)
Creatinine, Ser: 0.82 mg/dL (ref 0.61–1.24)
GFR calc non Af Amer: >60 mL/min (ref >60)
Glucose, Bld: 296 mg/dL — ABNORMAL HIGH (ref 65–99)
Potassium: 3.9 mmol/L (ref 3.5–5.1)
Sodium: 135 mmol/L (ref 135–145)

## 2015-03-30 LAB — TROPONIN I: Troponin I: <0.03 ng/mL (ref <0.031)

## 2015-03-30 NOTE — ED Provider Notes (Signed)
CSN: [id number]     Arrival date & time 03/30/15  3383 History   First MD Initiated Contact with Patient 03/30/15 [phone]     Chief Complaint  Patient presents with  . extremity numbness       HPI  Patient presents for evaluation of right arm numbness.  He states that yesterday was a normal day and was in his normal state of health. He awakened this morning at approximately 6:30. His right arm felt numb. States this lasted about 10 minutes and seemed to go away. States when he got up and around it went away. He did take an aspirin and Tylenol and belched. He denies any pain in his chest. He states that several years ago he had numbness in both arms and return it was a heart attack and had to have a bypass surgery. This was over 20 years ago. No shortness of breath no chest pain no left arm symptoms no neck pain. Asymptomatic here.  He admits that he got anxious. He states "I always remember what I had that heart attack. He states this was different but admits that he got anxious and feels considerably better now. Symptoms lasted approximately 10 minutes.  Past Medical History  Diagnosis Date  . Diabetes mellitus, type II   . Hyperlipidemia   . Coronary atherosclerosis of native coronary artery     a. CABG x 4 in 1989 (VG->OM1->OM2, VG->RCA, LIMA->LAD), b. 05/2010: DES to VG-OM1/OM2, DES to distal LCx. c. NSTEMI in 04/2011 - TO distal LCX stent and VG->OM2. d. 02/2012 NSTEMI DES to VG-OM1/continuation to OM2 occluded. e. inferior STEMI s/p DES to SVG-RAMUS 06/2012. f. inferolat STEMI 09/2012 s/p DES to SVG-interm; g. Lex MV (11/14):  EF 35%, inf-lat scar with small peri-infarct ischemia  . Essential hypertension, benign   . Osteoarthritis   . History of stroke   . History of pneumonia   . Cervical vertebral fracture   . Chronic back pain   . Benign prostatic hypertrophy     History of urinary retention  . Peptic ulcer disease   . Gastroesophageal reflux disease   . Ischemic cardiomyopathy  Nov 2015    EF 35% cath, 45-50% by echo  . Hypothyroidism    Past Surgical History  Procedure Laterality Date  . Tonsillectomy    . Coronary artery bypass graft  1989  . Coronary angioplasty  10/12, 8/13, 12/13, 3/14    SVG-OM PCI  . Cardiac catheterization  05/19/14    SVG-OM occl- medical Rx  . Left heart catheterization with coronary angiogram N/A 11/01/2011    Procedure: LEFT HEART CATHETERIZATION WITH CORONARY ANGIOGRAM;  Surgeon: Lorretta Harp, MD;  Location: [facility] CATH LAB;  Service: Cardiovascular;  Laterality: N/A;  . Percutaneous coronary stent intervention (pci-s) N/A 11/01/2011    Procedure: PERCUTANEOUS CORONARY STENT INTERVENTION (PCI-S);  Surgeon: Lorretta Harp, MD;  Location: [facility] CATH LAB;  Service: Cardiovascular;  Laterality: N/A;  . Left heart catheterization with coronary/graft angiogram N/A 03/10/2012    Procedure: LEFT HEART CATHETERIZATION WITH [name];  Surgeon: Sherren Mocha, MD;  Location: [facility] CATH LAB;  Service: Cardiovascular;  Laterality: N/A;  . Left heart catheterization with coronary angiogram N/A 06/18/2012    Procedure: LEFT HEART CATHETERIZATION WITH CORONARY ANGIOGRAM;  Surgeon: Peter M Martinique, MD;  Location: [facility] CATH LAB;  Service: Cardiovascular;  Laterality: N/A;  . Percutaneous coronary stent intervention (pci-s)  06/18/2012    Procedure: PERCUTANEOUS CORONARY STENT INTERVENTION (PCI-S);  Surgeon: Peter M Martinique,  MD;  Location: [facility] CATH LAB;  Service: Cardiovascular;;  . Left heart catheterization with coronary/graft angiogram  10/06/2012    Procedure: LEFT HEART CATHETERIZATION WITH [name];  Surgeon: Burnell Blanks, MD;  Location: [facility] CATH LAB;  Service: Cardiovascular;;  . Percutaneous coronary stent intervention (pci-s)  10/06/2012    Procedure: PERCUTANEOUS CORONARY STENT INTERVENTION (PCI-S);  Surgeon: Burnell Blanks, MD;  Location: [facility] CATH LAB;  Service: Cardiovascular;;  . Left heart catheterization  with coronary/graft angiogram N/A 11/09/2012    Procedure: LEFT HEART CATHETERIZATION WITH [name];  Surgeon: Peter M Martinique, MD;  Location: [facility] CATH LAB;  Service: Cardiovascular;  Laterality: N/A;  . Left heart catheterization with coronary/graft angiogram N/A 05/19/2014    Procedure: LEFT HEART CATHETERIZATION WITH [name];  Surgeon: Troy Sine, MD;  Location: [facility] CATH LAB;  Service: Cardiovascular;  Laterality: N/A;   Family History  Problem Relation Age of Onset  . Early death      Parents died young  . Appendicitis Mother     Pt was 59 year old  . Heart attack Father 73   Social History  Substance Use Topics  . Smoking status: Former Smoker -- 2.00 packs/day for 10 years    Types: Cigarettes    Start date: 07/14/1950    Quit date: 07/14/1961  . Smokeless tobacco: Current User    Types: Chew  . Alcohol Use: No    Review of Systems  Constitutional: Negative for fever, chills, diaphoresis, appetite change and fatigue.  HENT: Negative for mouth sores, sore throat and trouble swallowing.   Eyes: Negative for visual disturbance.  Respiratory: Negative for cough, chest tightness, shortness of breath and wheezing.   Cardiovascular: Negative for chest pain.  Gastrointestinal: Negative for nausea, vomiting, abdominal pain, diarrhea and abdominal distention.  Endocrine: Negative for polydipsia, polyphagia and polyuria.  Genitourinary: Negative for dysuria, frequency and hematuria.  Musculoskeletal: Negative for gait problem.  Skin: Negative for color change, pallor and rash.  Neurological: Positive for numbness. Negative for dizziness, syncope, light-headedness and headaches.       Right arm numbness, resolved  Hematological: Does not bruise/bleed easily.  Psychiatric/Behavioral: Negative for behavioral problems and confusion.      Allergies  Review of patient's allergies indicates no known allergies.  Home Medications   Prior to Admission  medications   Medication Sig Start Date End Date Taking? Authorizing Provider  acetaminophen (TYLENOL) 325 MG tablet Take 2 tablets (650 mg total) by mouth every 4 (four) hours as needed for headache or mild pain. 05/21/14  Yes Luke K Kilroy, PA-C  albuterol (PROVENTIL HFA;VENTOLIN HFA) 108 (90 BASE) MCG/ACT inhaler Inhale 2 puffs into the lungs every 6 (six) hours as needed for wheezing or shortness of breath.   Yes Historical Provider, MD  aspirin EC 81 MG tablet Take 81 mg by mouth daily.   Yes Historical Provider, MD  atorvastatin (LIPITOR) 40 MG tablet Take 1 tablet (40 mg total) by mouth daily at 6 PM. 05/29/13  Yes Scott T Kathlen Mody, PA-C  bismuth subsalicylate (PEPTO BISMOL) 262 MG/15ML suspension Take 30 mLs by mouth every 6 (six) hours as needed for indigestion or diarrhea or loose stools.    Yes Historical Provider, MD  BRILINTA 90 MG TABS tablet TAKE 1 TABLET BY MOUTH TWICE DAILY. 01/16/15  Yes Herminio Commons, MD  carvedilol (COREG) 3.125 MG tablet TAKE 1 TABLET BY MOUTH TWICE DAILY WITH MEALS. (HEART RATE & BLOOD PRESSURE) 03/05/15  Yes Phill Myron  Lawrence, NP  diphenoxylate-atropine (LOMOTIL) 2.5-0.025 MG per tablet Take 1 tablet by mouth 4 (four) times daily as needed for diarrhea or loose stools. 01/07/15  Yes Tanna Furry, MD  famotidine (PEPCID) 20 MG tablet Take 1 tablet (20 mg total) by mouth at bedtime. 03/03/15  Yes Rexene Alberts, MD  furosemide (LASIX) 20 MG tablet Take 20 mg by mouth as needed for fluid. Take 1 tablet as needed if 3 lb weight gain. 05/06/13  Yes Lendon Colonel, NP  isosorbide mononitrate (IMDUR) 30 MG 24 hr tablet Take 1.5 tablets (45 mg total) by mouth daily. 03/03/15  Yes Rexene Alberts, MD  levothyroxine (SYNTHROID, LEVOTHROID) 50 MCG tablet Take 50 mcg by mouth daily before breakfast.  09/08/14  Yes Historical Provider, MD  metFORMIN (GLUCOPHAGE) 500 MG tablet Take 2 tablets (1,000 mg total) by mouth 2 (two) times daily with a meal. RESTART ON Sunday, 03/04/15.  03/03/15  Yes Rexene Alberts, MD  Multiple Vitamin (MULTIVITAMIN WITH MINERALS) TABS Take 1 tablet by mouth daily.   Yes Historical Provider, MD  nitroGLYCERIN (NITROSTAT) 0.4 MG SL tablet Place 0.4 mg under the tongue every 5 (five) minutes as needed for chest pain.   Yes Historical Provider, MD  oxyCODONE-acetaminophen (PERCOCET) 5-325 MG per tablet Take 1 tablet by mouth every 6 (six) hours as needed for moderate pain. 03/03/15  Yes Rexene Alberts, MD  potassium chloride (K-DUR) 10 MEQ tablet Take 10 mEq by mouth as needed. Takes along with the Furosemide when he has swelling.   Yes Historical Provider, MD  simethicone (MYLICON) 80 MG chewable tablet Chew 80 mg by mouth every 6 (six) hours as needed for flatulence.   Yes Historical Provider, MD  tamsulosin (FLOMAX) 0.4 MG CAPS Take 0.4 mg by mouth daily after supper.  01/27/13  Yes Historical Provider, MD  glimepiride (AMARYL) 2 MG tablet FOR YOUR DIABETES WHILE OFF OF METFORMIN, TAKE 1 TABLET TODAY AND TOMORROW. Patient not taking: Reported on 03/27/2015 03/03/15   Rexene Alberts, MD   BP 139/64 mmHg  Pulse 69  Temp(Src) 98.1 F (36.7 C) (Oral)  Resp 12  Ht 5\' 10"  (1.778 m)  Wt 170 lb (77.111 kg)  BMI 24.39 kg/m2  SpO2 100% Physical Exam  Constitutional: He is oriented to person, place, and time. He appears well-developed and well-nourished. No distress.  HENT:  Head: Normocephalic.  Eyes: Conjunctivae are normal. Pupils are equal, round, and reactive to light. No scleral icterus.  Neck: Normal range of motion. Neck supple. No thyromegaly present.  Cardiovascular: Normal rate and regular rhythm.  Exam reveals no gallop and no friction rub.   No murmur heard. Pulmonary/Chest: Effort normal and breath sounds normal. No respiratory distress. He has no wheezes. He has no rales.  Abdominal: Soft. Bowel sounds are normal. He exhibits no distension. There is no tenderness. There is no rebound.  Musculoskeletal: Normal range of motion.   Neurological: He is alert and oriented to person, place, and time.  Normal cranial nerve exam. Symmetric strength and sensation of the 4 extremities.  Skin: Skin is warm and dry. No rash noted.  Psychiatric: He has a normal mood and affect. His behavior is normal.    ED Course  Procedures (including critical care time) Labs Review Labs Reviewed  CBC WITH DIFFERENTIAL/PLATELET - Abnormal; Notable for the following:    RBC 4.06 (*)    HCT 38.3 (*)    Platelets 142 (*)    All other components within normal limits  BASIC METABOLIC  PANEL - Abnormal; Notable for the following:    Glucose, Bld 296 (*)    Calcium 8.6 (*)    All other components within normal limits  TROPONIN I  TROPONIN I    Imaging Review Dg Chest Portable 1 View  03/30/2015   CLINICAL DATA:  Hypertension. One day history of right upper extremity region pain  EXAM: PORTABLE CHEST - 1 VIEW  COMPARISON:  March 27, 2015  FINDINGS: There is slight atelectasis in the left base. Lungs elsewhere clear. Heart is upper normal in size with pulmonary vascularity within normal limits. No adenopathy. Patient is status post internal mammary bypass grafting. No bone lesions. No pneumothorax.  IMPRESSION: Slight atelectasis left base. Lungs elsewhere clear. No change in cardiac silhouette.   Electronically Signed   By: Lowella Grip III M.D.   On: 03/30/2015 07:50   I have personally reviewed and evaluated these images and lab results as part of my medical decision-making.   EKG Interpretation None      MDM   Final diagnoses:  Numbness    This may be as simple positional neurapraxia. His symptoms went away with simple movement of the arm. He never had disuse or weakness. No chest pain or shortness of breath. EKG shows sinus rhythm. No significant changes versus comparison. We'll plan lab evaluation, enzymes, reevaluation.   He remains symptom free. Reassuring studies. I think is appropriate for discharge  home.    Tanna Furry, MD 03/30/15 [phone]

## 2015-03-30 NOTE — Progress Notes (Signed)
Cardiology Office Note   Date:  03/30/2015   ID:  Jeffrey [name], DOB Nov 15, 1928, MRN [id number]  PCP:  [name]., MD  Cardiologist:  Cloria Spring, NP   Chief Complaint  Patient presents with  . Coronary Artery Disease      History of Present Illness: Jeffrey [name] is a 81 y.o. male who presents for ongoing assessment and management  of CAD, previous CABG, subsequent myocardial infarction with stenting, hypertension, and diabetes mellitus. He was admitted for recurrent chest pain and dyspepsia. He was ruled out for cardiac etiology for chest pain. An echocardiogram was completed:   2-D echocardiogram 03/02/15:- Left ventricle: The cavity size was normal. Wall thickness was normal. Systolic function was mildly reduced. The estimated ejection fraction was in the range of 45% to 50%. Diffuse hypokinesis. Doppler parameters are consistent with abnormal left ventricular relaxation (grade 1 diastolic dysfunction). - Aortic valve: Moderately calcified annulus. Trileaflet; severely thickened leaflets. Valve area (VTI): 1.63 cm^2. Valve area (Vmax): 1.77 cm^2. - Mitral valve: Mildly calcified annulus. Mildly thickened leaflets - Left atrium: The atrium was moderately dilated. - Right atrium: The atrium was mildly dilated. - Technically adequate study.  He was seen in the ER last week due to fatigue and hypotension. Given fluids and released. He unfortunately returned to ER again this am with similar complaints, . He feels as if  he is dehydrated and needs to be hospitalized. He has multiple somatic complaints and is quite anxious. He drinks a lot of Gator Aid and Ensure. He occasionally checks himself into SNF to build back up.     Past Medical History  Diagnosis Date  . Diabetes mellitus, type II   . Hyperlipidemia   . Coronary atherosclerosis of native coronary artery     a. CABG x 4 in 1989 (VG->OM1->OM2, VG->RCA, LIMA->LAD), b. 05/2010: DES to  VG-OM1/OM2, DES to distal LCx. c. NSTEMI in 04/2011 - TO distal LCX stent and VG->OM2. d. 02/2012 NSTEMI DES to VG-OM1/continuation to OM2 occluded. e. inferior STEMI s/p DES to SVG-RAMUS 06/2012. f. inferolat STEMI 09/2012 s/p DES to SVG-interm; g. Lex MV (11/14):  EF 35%, inf-lat scar with small peri-infarct ischemia  . Essential hypertension, benign   . Osteoarthritis   . History of stroke   . History of pneumonia   . Cervical vertebral fracture   . Chronic back pain   . Benign prostatic hypertrophy     History of urinary retention  . Peptic ulcer disease   . Gastroesophageal reflux disease   . Ischemic cardiomyopathy Nov 2015    EF 35% cath, 45-50% by echo  . Hypothyroidism     Past Surgical History  Procedure Laterality Date  . Tonsillectomy    . Coronary artery bypass graft  1989  . Coronary angioplasty  10/12, 8/13, 12/13, 3/14    SVG-OM PCI  . Cardiac catheterization  05/19/14    SVG-OM occl- medical Rx  . Left heart catheterization with coronary angiogram N/A 11/01/2011    Procedure: LEFT HEART CATHETERIZATION WITH CORONARY ANGIOGRAM;  Surgeon: Lorretta Harp, MD;  Location: [facility] CATH LAB;  Service: Cardiovascular;  Laterality: N/A;  . Percutaneous coronary stent intervention (pci-s) N/A 11/01/2011    Procedure: PERCUTANEOUS CORONARY STENT INTERVENTION (PCI-S);  Surgeon: Lorretta Harp, MD;  Location: [facility] CATH LAB;  Service: Cardiovascular;  Laterality: N/A;  . Left heart catheterization with coronary/graft angiogram N/A 03/10/2012    Procedure: LEFT HEART CATHETERIZATION WITH [name];  Surgeon: Sherren Mocha, MD;  Location: [place] CATH LAB;  Service: Cardiovascular;  Laterality: N/A;  . Left heart catheterization with coronary angiogram N/A 06/18/2012    Procedure: LEFT HEART CATHETERIZATION WITH CORONARY ANGIOGRAM;  Surgeon: Peter M Martinique, MD;  Location: [facility] CATH LAB;  Service: Cardiovascular;  Laterality: N/A;  . Percutaneous coronary stent intervention (pci-s)   06/18/2012    Procedure: PERCUTANEOUS CORONARY STENT INTERVENTION (PCI-S);  Surgeon: Peter M Martinique, MD;  Location: [facility] CATH LAB;  Service: Cardiovascular;;  . Left heart catheterization with coronary/graft angiogram  10/06/2012    Procedure: LEFT HEART CATHETERIZATION WITH [name];  Surgeon: Burnell Blanks, MD;  Location: [facility] CATH LAB;  Service: Cardiovascular;;  . Percutaneous coronary stent intervention (pci-s)  10/06/2012    Procedure: PERCUTANEOUS CORONARY STENT INTERVENTION (PCI-S);  Surgeon: Burnell Blanks, MD;  Location: [facility] CATH LAB;  Service: Cardiovascular;;  . Left heart catheterization with coronary/graft angiogram N/A 11/09/2012    Procedure: LEFT HEART CATHETERIZATION WITH [name];  Surgeon: Peter M Martinique, MD;  Location: [facility] CATH LAB;  Service: Cardiovascular;  Laterality: N/A;  . Left heart catheterization with coronary/graft angiogram N/A 05/19/2014    Procedure: LEFT HEART CATHETERIZATION WITH [name];  Surgeon: Troy Sine, MD;  Location: [facility] CATH LAB;  Service: Cardiovascular;  Laterality: N/A;     Current Outpatient Prescriptions  Medication Sig Dispense Refill  . acetaminophen (TYLENOL) 325 MG tablet Take 2 tablets (650 mg total) by mouth every 4 (four) hours as needed for headache or mild pain.    Marland Kitchen albuterol (PROVENTIL HFA;VENTOLIN HFA) 108 (90 BASE) MCG/ACT inhaler Inhale 2 puffs into the lungs every 6 (six) hours as needed for wheezing or shortness of breath.    Marland Kitchen aspirin EC 81 MG tablet Take 81 mg by mouth daily.    Marland Kitchen atorvastatin (LIPITOR) 40 MG tablet Take 1 tablet (40 mg total) by mouth daily at 6 PM. 30 tablet 11  . bismuth subsalicylate (PEPTO BISMOL) 262 MG/15ML suspension Take 30 mLs by mouth every 6 (six) hours as needed for indigestion or diarrhea or loose stools.     Marland Kitchen BRILINTA 90 MG TABS tablet TAKE 1 TABLET BY MOUTH TWICE DAILY. 60 tablet 3  . carvedilol (COREG) 3.125 MG tablet TAKE 1 TABLET BY  MOUTH TWICE DAILY WITH MEALS. (HEART RATE & BLOOD PRESSURE) 60 tablet 3  . diphenoxylate-atropine (LOMOTIL) 2.5-0.025 MG per tablet Take 1 tablet by mouth 4 (four) times daily as needed for diarrhea or loose stools. 30 tablet 0  . famotidine (PEPCID) 20 MG tablet Take 1 tablet (20 mg total) by mouth at bedtime. 30 tablet 3  . furosemide (LASIX) 20 MG tablet Take 20 mg by mouth as needed for fluid. Take 1 tablet as needed if 3 lb weight gain.    Marland Kitchen glimepiride (AMARYL) 2 MG tablet FOR YOUR DIABETES WHILE OFF OF METFORMIN, TAKE 1 TABLET TODAY AND TOMORROW. 2 tablet 0  . isosorbide mononitrate (IMDUR) 30 MG 24 hr tablet Take 1.5 tablets (45 mg total) by mouth daily. 45 tablet 11  . levothyroxine (SYNTHROID, LEVOTHROID) 50 MCG tablet Take 50 mcg by mouth daily before breakfast.     . metFORMIN (GLUCOPHAGE) 500 MG tablet Take 2 tablets (1,000 mg total) by mouth 2 (two) times daily with a meal. RESTART ON Sunday, 03/04/15.    . Multiple Vitamin (MULTIVITAMIN WITH MINERALS) TABS Take 1 tablet by mouth daily.    . nitroGLYCERIN (NITROSTAT) 0.4 MG SL tablet Place 0.4 mg under the tongue every 5 (five)  minutes as needed for chest pain.    Marland Kitchen oxyCODONE-acetaminophen (PERCOCET) 5-325 MG per tablet Take 1 tablet by mouth every 6 (six) hours as needed for moderate pain. 20 tablet 0  . potassium chloride (K-DUR) 10 MEQ tablet Take 10 mEq by mouth as needed. Takes along with the Furosemide when he has swelling.    . simethicone (MYLICON) 80 MG chewable tablet Chew 80 mg by mouth every 6 (six) hours as needed for flatulence.    . tamsulosin (FLOMAX) 0.4 MG CAPS Take 0.4 mg by mouth daily after supper.      No current facility-administered medications for this visit.    Allergies:   Review of patient's allergies indicates no known allergies.    Social History:  The patient  reports that he quit smoking about 53 years ago. His smoking use included Cigarettes. He started smoking about 64 years ago. He has a 20  pack-year smoking history. His smokeless tobacco use includes Chew. He reports that he does not drink alcohol.   Family History:  The patient's family history includes Appendicitis in his mother; Early death in an other family member; Heart attack (age of onset: 7) in his father.    ROS: All other systems are reviewed and negative. Unless otherwise mentioned in H&P    PHYSICAL EXAM: VS:  BP 110/70 mmHg  Pulse 94  Ht 5\' 10"  (1.778 m)  Wt 174 lb 12.8 oz (79.289 kg)  BMI 25.08 kg/m2  SpO2 97% , BMI Body mass index is 25.08 kg/(m^2). GEN: Well nourished, well developed,  Complaining of profound weakness. HEENT: normalEdentulous.  Neck: no JVD, carotid bruits, or masses Cardiac: RRR;distant  no murmurs, rubs, or gallops,no edema  Respiratory:  clear to auscultation bilaterally, normal work of breathing GI: soft, nontender, nondistended, + BS MS: no deformity or atrophy Skin: warm and dry, no rash Neuro:  Strength and sensation are intact Psych: anxious and depressed. mood,   Recent Labs: 06/09/2014: Magnesium 1.8 03/02/2015: TSH 0.372 03/16/2015: ALT 23 03/30/2015: BUN 11; Creatinine, Ser 0.82; Hemoglobin 13.1; Platelets 142*; Potassium 3.9; Sodium 135    Lipid Panel    Component Value Date/Time   CHOL 190 05/29/2013 0410   TRIG 335* 05/29/2013 0410   HDL 33* 05/29/2013 0410   CHOLHDL 5.8 05/29/2013 0410   VLDL 67* 05/29/2013 0410   LDLCALC 90 05/29/2013 0410      Wt Readings from Last 3 Encounters:  03/30/15 174 lb 12.8 oz (79.289 kg)  03/30/15 170 lb (77.111 kg)  03/26/15 170 lb (77.111 kg)      ASSESSMENT AND PLAN:  1.  CAD S/P CABG: He denies chest pain or DOE. He is compliant with medications. Echocardiogram which is reviewed, completed on 03/02/2015 demonstrates low normal EF. He had no evidence of lfuid overload, or dehydration. He is not taking lasix daily, only prn. I do not see reason from a cardiac standpoint to admit him.  2. Hypertension: Low normal. He is  advised to stay hydrated, avoid caffeine and high sugar which can cause dehydration and blood sugar elevations.   3. Generalized weakness: Very anxious about his health. He wants a "shot of something": to make him feel better or to go to the hospital. I have given him high potency B-complex vitamin recommendation which he will fill at his pharmacy. If he needs further evaluation he is to see his PCP and not return to the ER, as he has been there 3 times this week alone. Labs are reviewed.  He was found to have elevated BG. Other labs essentially unremarkable.   Current medicines are reviewed at length with the patient today.    Labs/ tests ordered today include:  No orders of the defined types were placed in this encounter.     Disposition:   FU 6 months.  Signed, Jory Sims, NP  03/30/2015 3:18 PM    [place] [address], [place], [place] [address] Phone: [phone]; Fax: [phone]

## 2015-03-30 NOTE — ED Notes (Signed)
EDP at bedside  

## 2015-03-30 NOTE — Progress Notes (Deleted)
Name: Jeffrey [name]    DOB: May 24, 1929  Age: 81 y.o.  MR#: [id number]       PCP:  [name]., MD      Insurance: Payor: MEDICARE / Plan: MEDICARE PART A AND B / Product Type: *No Product type* /   CC:    Chief Complaint  Patient presents with  . Coronary Artery Disease    VS Filed Vitals:   03/30/15 1509  BP: 110/70  Pulse: 94  Height: 5\' 10"  (1.778 m)  Weight: 174 lb 12.8 oz (79.289 kg)  SpO2: 97%    Weights Current Weight  03/30/15 174 lb 12.8 oz (79.289 kg)  03/30/15 170 lb (77.111 kg)  03/26/15 170 lb (77.111 kg)    Blood Pressure  BP Readings from Last 3 Encounters:  03/30/15 110/70  03/30/15 137/73  03/27/15 128/78     Admit date:  (Not on file) Last encounter with RMR:  12/07/2014   Allergy Review of patient's allergies indicates no known allergies.  Current Outpatient Prescriptions  Medication Sig Dispense Refill  . acetaminophen (TYLENOL) 325 MG tablet Take 2 tablets (650 mg total) by mouth every 4 (four) hours as needed for headache or mild pain.    Marland Kitchen albuterol (PROVENTIL HFA;VENTOLIN HFA) 108 (90 BASE) MCG/ACT inhaler Inhale 2 puffs into the lungs every 6 (six) hours as needed for wheezing or shortness of breath.    Marland Kitchen aspirin EC 81 MG tablet Take 81 mg by mouth daily.    Marland Kitchen atorvastatin (LIPITOR) 40 MG tablet Take 1 tablet (40 mg total) by mouth daily at 6 PM. 30 tablet 11  . bismuth subsalicylate (PEPTO BISMOL) 262 MG/15ML suspension Take 30 mLs by mouth every 6 (six) hours as needed for indigestion or diarrhea or loose stools.     Marland Kitchen BRILINTA 90 MG TABS tablet TAKE 1 TABLET BY MOUTH TWICE DAILY. 60 tablet 3  . carvedilol (COREG) 3.125 MG tablet TAKE 1 TABLET BY MOUTH TWICE DAILY WITH MEALS. (HEART RATE & BLOOD PRESSURE) 60 tablet 3  . diphenoxylate-atropine (LOMOTIL) 2.5-0.025 MG per tablet Take 1 tablet by mouth 4 (four) times daily as needed for diarrhea or loose stools. 30 tablet 0  . famotidine (PEPCID) 20 MG tablet Take 1 tablet (20 mg total) by  mouth at bedtime. 30 tablet 3  . furosemide (LASIX) 20 MG tablet Take 20 mg by mouth as needed for fluid. Take 1 tablet as needed if 3 lb weight gain.    Marland Kitchen glimepiride (AMARYL) 2 MG tablet FOR YOUR DIABETES WHILE OFF OF METFORMIN, TAKE 1 TABLET TODAY AND TOMORROW. 2 tablet 0  . isosorbide mononitrate (IMDUR) 30 MG 24 hr tablet Take 1.5 tablets (45 mg total) by mouth daily. 45 tablet 11  . levothyroxine (SYNTHROID, LEVOTHROID) 50 MCG tablet Take 50 mcg by mouth daily before breakfast.     . metFORMIN (GLUCOPHAGE) 500 MG tablet Take 2 tablets (1,000 mg total) by mouth 2 (two) times daily with a meal. RESTART ON Sunday, 03/04/15.    . Multiple Vitamin (MULTIVITAMIN WITH MINERALS) TABS Take 1 tablet by mouth daily.    . nitroGLYCERIN (NITROSTAT) 0.4 MG SL tablet Place 0.4 mg under the tongue every 5 (five) minutes as needed for chest pain.    Marland Kitchen oxyCODONE-acetaminophen (PERCOCET) 5-325 MG per tablet Take 1 tablet by mouth every 6 (six) hours as needed for moderate pain. 20 tablet 0  . potassium chloride (K-DUR) 10 MEQ tablet Take 10 mEq by mouth as needed. Takes along  with the Furosemide when he has swelling.    . simethicone (MYLICON) 80 MG chewable tablet Chew 80 mg by mouth every 6 (six) hours as needed for flatulence.    . tamsulosin (FLOMAX) 0.4 MG CAPS Take 0.4 mg by mouth daily after supper.      No current facility-administered medications for this visit.    Discontinued Meds:   There are no discontinued medications.  Patient Active Problem List   Diagnosis Date Noted  . Atypical chest pain 03/02/2015  . Dyspepsia 03/02/2015  . Abdominal pain, epigastric 03/02/2015  . Essential hypertension 03/02/2015  . Angina at rest 06/09/2014  . Abnormal ECG 06/09/2014  . Chest pain at rest 06/09/2014  . Dizziness 06/01/2014  . Unstable angina 05/19/2014  . History of Clostridium difficile 11/05/2013  . NSTEMI (non-ST elevated myocardial infarction) 08/29/2013  . Nausea & vomiting 08/27/2013  .  Diarrhea 08/27/2013  . C. difficile diarrhea 08/27/2013  . Chest pain 06/16/2013  . Precordial chest pain 05/29/2013  . Coronary atherosclerosis of native coronary artery 05/29/2013  . Noncompliance 11/22/2012  . Anemia, normocytic normochromic   . Cardiomyopathy, ischemic 06/21/2012  . Cerebrovascular disease 06/08/2012  . Diabetes mellitus, type II   . Hypertension   . Arteriosclerotic cardiovascular disease (ASCVD) 10/25/2011  . Hyperlipidemia 10/24/2011  . BPH (benign prostatic hyperplasia) 05/07/2011    LABS    Component Value Date/Time   NA 135 03/30/2015 0757   NA 135 03/27/2015 1257   NA 139 03/26/2015 2210   K 3.9 03/30/2015 0757   K 3.9 03/27/2015 1257   K 3.8 03/26/2015 2210   CL 104 03/30/2015 0757   CL 102 03/27/2015 1257   CL 106 03/26/2015 2210   CO2 25 03/30/2015 0757   CO2 26 03/26/2015 2210   CO2 23 03/16/2015 1250   GLUCOSE 296* 03/30/2015 0757   GLUCOSE 220* 03/27/2015 1257   GLUCOSE 175* 03/26/2015 2210   BUN 11 03/30/2015 0757   BUN 9 03/27/2015 1257   BUN 11 03/26/2015 2210   CREATININE 0.82 03/30/2015 0757   CREATININE 0.70 03/27/2015 1257   CREATININE 0.69 03/26/2015 2210   CREATININE 0.94 06/21/2014 1501   CREATININE 1.19 04/27/2013 1140   CREATININE 0.98 12/04/2011 1208   CALCIUM 8.6* 03/30/2015 0757   CALCIUM 9.0 03/26/2015 2210   CALCIUM 9.0 03/16/2015 1250   GFRNONAA >60 03/30/2015 0757   GFRNONAA >60 03/26/2015 2210   GFRNONAA >60 03/16/2015 1250   GFRNONAA 75 06/21/2014 1501   GFRAA >60 03/30/2015 0757   GFRAA >60 03/26/2015 2210   GFRAA >60 03/16/2015 1250   GFRAA 86 06/21/2014 1501   CMP     Component Value Date/Time   NA 135 03/30/2015 0757   K 3.9 03/30/2015 0757   CL 104 03/30/2015 0757   CO2 25 03/30/2015 0757   GLUCOSE 296* 03/30/2015 0757   BUN 11 03/30/2015 0757   CREATININE 0.82 03/30/2015 0757   CREATININE 0.94 06/21/2014 1501   CALCIUM 8.6* 03/30/2015 0757   PROT 6.8 03/16/2015 1250   ALBUMIN 4.0  03/16/2015 1250   AST 28 03/16/2015 1250   ALT 23 03/16/2015 1250   ALKPHOS 84 03/16/2015 1250   BILITOT 0.5 03/16/2015 1250   GFRNONAA >60 03/30/2015 0757   GFRNONAA 75 06/21/2014 1501   GFRAA >60 03/30/2015 0757   GFRAA 86 06/21/2014 1501       Component Value Date/Time   WBC 6.1 03/30/2015 0757   WBC 6.7 03/26/2015 2210   WBC 9.2  03/16/2015 1250   HGB 13.1 03/30/2015 0757   HGB 13.9 03/27/2015 1257   HGB 13.7 03/26/2015 2210   HCT 38.3* 03/30/2015 0757   HCT 41.0 03/27/2015 1257   HCT 40.0 03/26/2015 2210   MCV 94.3 03/30/2015 0757   MCV 94.3 03/26/2015 2210   MCV 94.0 03/16/2015 1250    Lipid Panel     Component Value Date/Time   CHOL 190 05/29/2013 0410   TRIG 335* 05/29/2013 0410   HDL 33* 05/29/2013 0410   CHOLHDL 5.8 05/29/2013 0410   VLDL 67* 05/29/2013 0410   LDLCALC 90 05/29/2013 0410    ABG    Component Value Date/Time   TCO2 24 03/27/2015 1257     Lab Results  Component Value Date   TSH 0.372 03/02/2015   BNP (last 3 results) No results for input(s): BNP in the last 8760 hours.  ProBNP (last 3 results) No results for input(s): PROBNP in the last 8760 hours.  Cardiac Panel (last 3 results)  Recent Labs  03/30/15 0757 03/30/15 1042  TROPONINI <0.03 <0.03    Iron/TIBC/Ferritin/ %Sat No results found for: IRON, TIBC, FERRITIN, IRONPCTSAT   EKG Orders placed or performed during the hospital encounter of 03/30/15  . ED EKG  . ED EKG  . EKG 12-Lead  . EKG 12-Lead  . EKG     Prior Assessment and Plan Problem List as of 03/30/2015      Cardiovascular and Mediastinum   Arteriosclerotic cardiovascular disease (ASCVD)   Last Assessment & Plan 10/13/2013 Office Visit Written 10/13/2013  4:57 PM by Lendon Colonel, NP    At this office visit, he has no cardiac complaints, his main complaint is recurrent diarrhea, frequent, occurring all day, with now associated listlessness. He has been sent over to the emergency room, at the [facility],  for further evaluation. I am concerned that his C. difficile has returned, and he is becoming dehydrated again. I have spoken with Jenny Reichmann in the emergency room and have given him information on this patient concerning contact precautions in this setting. He is taken to the ER via wheelchair.      Hypertension   Last Assessment & Plan 10/13/2013 Office Visit Written 10/13/2013  4:58 PM by Lendon Colonel, NP    He is now taking lisinopril on his own, as his blood pressures are running low. Today in the office it is normal. With frequent diarrhea and possible dehydration, I agree with him stopping this for now.      Cerebrovascular disease   Last Assessment & Plan 09/09/2012 Office Visit Written 09/09/2012  3:42 PM by Yehuda Savannah, MD    Recent carotid ultrasound negative for obstruction. Optimal control of cardiovascular risk factors appears effective in preventing progression of disease and will be continued.      Cardiomyopathy, ischemic   Last Assessment & Plan 05/06/2013 Office Visit Written 05/06/2013  2:05 PM by Lendon Colonel, NP    Improved EF per echo this month to 50%. No further evidence of fluid retention. He is breathing better without edema and is down 5 lbs. I have asked him to weigh himself daily. If he gain 3 lbs in 24 hours. He is to take one dose of lasix with potasium that day. He is not to take it on a daily basis. He is reminded of low sodium diet. We will see him in 6 months.      Coronary atherosclerosis of native coronary artery   NSTEMI (  non-ST elevated myocardial infarction)   Unstable angina   Angina at rest   Essential hypertension     Digestive   Nausea & vomiting   C. difficile diarrhea   Last Assessment & Plan 10/13/2013 Office Visit Written 10/13/2013  4:59 PM by Lendon Colonel, NP    Sent to [facility] ER from our office to do to frequent stooling abdominal pain and known history of same 2 months ago. Contact precautions are recommended to ER staff over the  phone on report to them concerning our transport to their clinic.        Endocrine   Diabetes mellitus, type II   Last Assessment & Plan 04/27/2013 Office Visit Written 04/27/2013  1:54 PM by Lendon Colonel, NP    Followed by PCP        Genitourinary   BPH (benign prostatic hyperplasia)   Last Assessment & Plan 09/09/2012 Office Visit Edited 09/11/2012 11:21 PM by Yehuda Savannah, MD    Patient advised not to increase his dose of Flomax or other medications without specific instructions and will resume his usual daily dose.  He is scheduled to return to see Dr. Michela Pitcher in the near future for catheter removal.        Other   Hyperlipidemia   Last Assessment & Plan 05/06/2013 Office Visit Written 05/06/2013  2:06 PM by Lendon Colonel, NP    Will need to have follow up lipids and LFT's in 3 months.      Anemia, normocytic normochromic   Last Assessment & Plan 04/27/2013 Office Visit Written 04/27/2013  1:55 PM by Lendon Colonel, NP    Stable. Most recent lab work last week. Continues on iron replacement.      Noncompliance   Last Assessment & Plan 04/27/2013 Office Visit Written 04/27/2013  1:54 PM by Lendon Colonel, NP    He is not adhering to low sodium diet. I have reinforced this to him. He is to weigh daily.      Precordial chest pain   Chest pain   Diarrhea   History of Clostridium difficile   Dizziness   Abnormal ECG   Chest pain at rest   Atypical chest pain   Dyspepsia   Abdominal pain, epigastric       Imaging: Dg Chest 2 View  03/27/2015   CLINICAL DATA:  History of hypertension.  Weakness.  EXAM: CHEST  2 VIEW  COMPARISON:  March 16, 2015  FINDINGS: The heart size and mediastinal contours are stable. Patient status post prior median sternotomy. Surgical clips are identified in the left hilum unchanged. There is no focal infiltrate, pulmonary edema, or pleural effusion. The visualized skeletal structures are for stable.  IMPRESSION: No active  cardiopulmonary disease.   Electronically Signed   By: Abelardo Diesel M.D.   On: 03/27/2015 13:28   Dg Chest 2 View  03/16/2015   CLINICAL DATA:  81 year old male with weakness and dizziness for 1 week. Initial encounter. Former smoker  EXAM: CHEST  2 VIEW  COMPARISON:  03/02/2015 and earlier.  FINDINGS: Sequelae of CABG. Stable cardiac size and mediastinal contours. Calcified aortic atherosclerosis. Stable lung volumes. No pneumothorax, pulmonary edema, pleural effusion or confluent pulmonary opacity. Osteopenia. No acute osseous abnormality identified.  IMPRESSION: No acute cardiopulmonary abnormality.   Electronically Signed   By: Genevie Ann M.D.   On: 03/16/2015 13:05   Dg Chest 2 View  03/02/2015   CLINICAL DATA:  Chest pain  EXAM:  CHEST  2 VIEW  COMPARISON:  12/13/2014  FINDINGS: Normal heart size. Clear lungs. No pneumothorax or pleural effusion. Stable thoracic spine.  IMPRESSION: No active cardiopulmonary disease.   Electronically Signed   By: Marybelle Killings M.D.   On: 03/02/2015 10:16   Ct Abdomen Pelvis W Contrast  03/02/2015   CLINICAL DATA:  Mid abdominal pain began this morning.  EXAM: CT ABDOMEN AND PELVIS WITH CONTRAST  TECHNIQUE: Multidetector CT imaging of the abdomen and pelvis was performed using the standard protocol following bolus administration of intravenous contrast.  CONTRAST:  31mL OMNIPAQUE IOHEXOL 300 MG/ML SOLN, 130mL OMNIPAQUE IOHEXOL 300 MG/ML SOLN  COMPARISON:  None.  FINDINGS: Lower chest: The lung bases are clear except for dependent subpleural atelectasis. The heart is normal in size. No pericardial effusion. The distal esophagus is grossly normal. There are remote appearing incompletely healed lower left rib fractures involving the eighth, ninth and eleventh ribs.  Hepatobiliary: Mild fatty infiltration of the liver but no focal hepatic lesions or intrahepatic biliary dilatation. The gallbladder appears normal. No common bile duct dilatation.  Pancreas: No mass, inflammation  or ductal dilatation. Small duodenum diverticulum noted near the pancreatic head.  Spleen: Normal size.  No focal lesions.  Adrenals/Urinary Tract: The adrenal glands and kidneys are unremarkable. No renal calculi or mass. No hydronephrosis. No ureteral or bladder calculi. Mild diffuse bladder wall thickening and enlarged prostate gland.  Stomach/Bowel: The stomach, duodenum, small bowel and colon are unremarkable. No inflammatory changes, mass lesions or obstructive findings. The terminal ileum is normal. The appendix is normal. Sigmoid diverticulosis without findings for acute diverticulitis.  Vascular/Lymphatic: Advanced atherosclerotic calcifications involving the aorta and branch vessel ostia. The branch vessels are patent. The major venous structures are patent. Small scattered mesenteric and retroperitoneal lymph nodes but no mass or adenopathy.  Other: Markedly enlarged prostate gland with median lobe hypertrophy impressing on the base of the bladder. Diffuse bladder wall thickening likely due to partial bladder outlet obstruction. No pelvic mass or adenopathy. No free pelvic fluid collections. No inguinal mass or adenopathy.  Musculoskeletal: No significant bony findings. There are degenerative changes involving the spine.  IMPRESSION: 1. No acute abdominal/pelvic findings, mass lesions or lymphadenopathy. 2. Remote ununited lower left rib fractures. 3. Advanced atherosclerotic calcifications involving the abdominal aorta but no aneurysm or dissection. 4. Sigmoid diverticulosis without findings for acute diverticulitis. 5. Enlarged prostate gland with median lobe hypertrophy impressing on the base of the bladder and likely causing partial bladder outlet obstruction.   Electronically Signed   By: Marijo Sanes M.D.   On: 03/02/2015 11:17   Dg Chest Portable 1 View  03/30/2015   CLINICAL DATA:  Hypertension. One day history of right upper extremity region pain  EXAM: PORTABLE CHEST - 1 VIEW  COMPARISON:   March 27, 2015  FINDINGS: There is slight atelectasis in the left base. Lungs elsewhere clear. Heart is upper normal in size with pulmonary vascularity within normal limits. No adenopathy. Patient is status post internal mammary bypass grafting. No bone lesions. No pneumothorax.  IMPRESSION: Slight atelectasis left base. Lungs elsewhere clear. No change in cardiac silhouette.   Electronically Signed   By: Lowella Grip III M.D.   On: 03/30/2015 07:50

## 2015-03-30 NOTE — Patient Instructions (Signed)
Your physician wants you to follow-up in: 6 months. You will receive a reminder letter in the mail two months in advance. If you don't receive a letter, please call our office to schedule the follow-up appointment.  Your physician recommends that you continue on your current medications as directed. Please refer to the Current Medication list given to you today.  Start Taking Vitamin B-Complex Daily  Thank you for choosing [facility]!

## 2015-03-30 NOTE — Discharge Instructions (Signed)
Recheck with your primary care physician. Return to ER with any new or worsening symptoms.

## 2015-03-30 NOTE — ED Notes (Signed)
Pt reports woke up at 0630 with numbness in r arm and c/o belching a lot.  Reports took an asa and tylenol and arm feels better.  Also has been taking rolaids for gas.  Pt says had similar feeling years ago when he had a heart attack.  Denies any problems speaking or swallowing.  Denies weakness or numbness anywhere else.

## 2015-03-31 ENCOUNTER — Encounter (HOSPITAL_COMMUNITY): Payer: Self-pay | Admitting: Emergency Medicine

## 2015-03-31 ENCOUNTER — Emergency Department (HOSPITAL_COMMUNITY)
Admission: EM | Admit: 2015-03-31 | Discharge: 2015-04-01 | Disposition: A | Discharge status: Home / Self Care | Payer: Medicare Other | Attending: Emergency Medicine | Admitting: Emergency Medicine

## 2015-03-31 DIAGNOSIS — M1990 Unspecified osteoarthritis, unspecified site: Secondary | ICD-10-CM | POA: Insufficient documentation

## 2015-03-31 DIAGNOSIS — Z79899 Other long term (current) drug therapy: Secondary | ICD-10-CM | POA: Insufficient documentation

## 2015-03-31 DIAGNOSIS — Z9889 Other specified postprocedural states: Secondary | ICD-10-CM | POA: Diagnosis not present

## 2015-03-31 DIAGNOSIS — Z8781 Personal history of (healed) traumatic fracture: Secondary | ICD-10-CM | POA: Diagnosis not present

## 2015-03-31 DIAGNOSIS — K219 Gastro-esophageal reflux disease without esophagitis: Secondary | ICD-10-CM | POA: Diagnosis not present

## 2015-03-31 DIAGNOSIS — Z951 Presence of aortocoronary bypass graft: Secondary | ICD-10-CM | POA: Diagnosis not present

## 2015-03-31 DIAGNOSIS — Z9861 Coronary angioplasty status: Secondary | ICD-10-CM | POA: Diagnosis not present

## 2015-03-31 DIAGNOSIS — Z7982 Long term (current) use of aspirin: Secondary | ICD-10-CM | POA: Insufficient documentation

## 2015-03-31 DIAGNOSIS — Z87891 Personal history of nicotine dependence: Secondary | ICD-10-CM | POA: Diagnosis not present

## 2015-03-31 DIAGNOSIS — E039 Hypothyroidism, unspecified: Secondary | ICD-10-CM | POA: Diagnosis not present

## 2015-03-31 DIAGNOSIS — G8929 Other chronic pain: Secondary | ICD-10-CM | POA: Diagnosis not present

## 2015-03-31 DIAGNOSIS — E785 Hyperlipidemia, unspecified: Secondary | ICD-10-CM | POA: Diagnosis not present

## 2015-03-31 DIAGNOSIS — Z8673 Personal history of transient ischemic attack (TIA), and cerebral infarction without residual deficits: Secondary | ICD-10-CM | POA: Insufficient documentation

## 2015-03-31 DIAGNOSIS — R197 Diarrhea, unspecified: Secondary | ICD-10-CM | POA: Diagnosis present

## 2015-03-31 DIAGNOSIS — I10 Essential (primary) hypertension: Secondary | ICD-10-CM | POA: Diagnosis not present

## 2015-03-31 DIAGNOSIS — N400 Enlarged prostate without lower urinary tract symptoms: Secondary | ICD-10-CM | POA: Diagnosis not present

## 2015-03-31 DIAGNOSIS — E119 Type 2 diabetes mellitus without complications: Secondary | ICD-10-CM | POA: Insufficient documentation

## 2015-03-31 DIAGNOSIS — I2510 Atherosclerotic heart disease of native coronary artery without angina pectoris: Secondary | ICD-10-CM | POA: Insufficient documentation

## 2015-03-31 DIAGNOSIS — Z8701 Personal history of pneumonia (recurrent): Secondary | ICD-10-CM | POA: Diagnosis not present

## 2015-03-31 DIAGNOSIS — R1030 Lower abdominal pain, unspecified: Secondary | ICD-10-CM | POA: Diagnosis not present

## 2015-03-31 DIAGNOSIS — Z8711 Personal history of peptic ulcer disease: Secondary | ICD-10-CM | POA: Insufficient documentation

## 2015-03-31 LAB — POC OCCULT BLOOD, ED: Fecal Occult Bld: NEGATIVE

## 2015-03-31 MED ORDER — SODIUM CHLORIDE 0.9 % IV SOLN
INTRAVENOUS | Status: DC
  Administered 2015-04-01: 1000 mL via INTRAVENOUS

## 2015-03-31 MED ORDER — LOPERAMIDE HCL 2 MG PO CAPS
2.0000 mg | ORAL_CAPSULE | Freq: Once | ORAL | Status: AC
  Administered 2015-04-01: 2 mg via ORAL
  Filled 2015-03-31: qty 1

## 2015-03-31 NOTE — ED Notes (Signed)
Pt reports loose stools for the past 3 hours. Pt says it was black in color & is taking pepto bismol.

## 2015-03-31 NOTE — ED Notes (Signed)
Patient complaining of diarrhea for 3 hours.

## 2015-03-31 NOTE — ED Provider Notes (Signed)
TIME SEEN: 11:39 PM   CHIEF COMPLAINT: Diarrhea  HPI: Pt is a 81 y.o. male with history of CAD, hypertension, diabetes, hyperlipidemia who presents to the emergency department with diarrhea onset 3 hours ago. He reports associated moderate "gassy" lower abdominal pain since onset. He denies any fever, dysuria, blood in urine, difficulty urinating or N/V. He states that he took [employer] with no relief. He denies recently being on any antibiotics. He denies any recent hospitalizations but adds that he was hospitalized for diarrhea last year. He denies any abdominal surgeries. No recent travel. No known sick contacts. He reports that his blood sugar was running around 260. He reports that he has noticed his stools have been black. Reports he is on iron, also took Pepto-Bismol today and is on Brilinta. No prior history of GI bleed.  PCP: Larene Pickett, Dr. Gerarda Fraction   ROS: See HPI Constitutional: no fever  Eyes: no drainage  ENT: no runny nose   Cardiovascular:  no chest pain  Resp: no SOB  GI: no vomiting GU: no dysuria Integumentary: no rash  Allergy: no hives  Musculoskeletal: no leg swelling  Neurological: no slurred speech ROS otherwise negative  PAST MEDICAL HISTORY/PAST SURGICAL HISTORY:  Past Medical History  Diagnosis Date  . Diabetes mellitus, type II   . Hyperlipidemia   . Coronary atherosclerosis of native coronary artery     a. CABG x 4 in 1989 (VG->OM1->OM2, VG->RCA, LIMA->LAD), b. 05/2010: DES to VG-OM1/OM2, DES to distal LCx. c. NSTEMI in 04/2011 - TO distal LCX stent and VG->OM2. d. 02/2012 NSTEMI DES to VG-OM1/continuation to OM2 occluded. e. inferior STEMI s/p DES to SVG-RAMUS 06/2012. f. inferolat STEMI 09/2012 s/p DES to SVG-interm; g. Lex MV (11/14):  EF 35%, inf-lat scar with small peri-infarct ischemia  . Essential hypertension, benign   . Osteoarthritis   . History of stroke   . History of pneumonia   . Cervical vertebral fracture   . Chronic back pain   . Benign  prostatic hypertrophy     History of urinary retention  . Peptic ulcer disease   . Gastroesophageal reflux disease   . Ischemic cardiomyopathy Nov 2015    EF 35% cath, 45-50% by echo  . Hypothyroidism     MEDICATIONS:  Prior to Admission medications   Medication Sig Start Date End Date Taking? Authorizing Roland Lipke  acetaminophen (TYLENOL) 325 MG tablet Take 2 tablets (650 mg total) by mouth every 4 (four) hours as needed for headache or mild pain. 05/21/14   Erlene Quan, PA-C  albuterol (PROVENTIL HFA;VENTOLIN HFA) 108 (90 BASE) MCG/ACT inhaler Inhale 2 puffs into the lungs every 6 (six) hours as needed for wheezing or shortness of breath.    Historical Napoleon Monacelli, MD  aspirin EC 81 MG tablet Take 81 mg by mouth daily.    Historical Jennavecia Schwier, MD  atorvastatin (LIPITOR) 40 MG tablet Take 1 tablet (40 mg total) by mouth daily at 6 PM. 05/29/13   Liliane Shi, PA-C  bismuth subsalicylate (PEPTO BISMOL) 262 MG/15ML suspension Take 30 mLs by mouth every 6 (six) hours as needed for indigestion or diarrhea or loose stools.     Historical Amoreena Neubert, MD  BRILINTA 90 MG TABS tablet TAKE 1 TABLET BY MOUTH TWICE DAILY. 01/16/15   Herminio Commons, MD  carvedilol (COREG) 3.125 MG tablet TAKE 1 TABLET BY MOUTH TWICE DAILY WITH MEALS. (HEART RATE & BLOOD PRESSURE) 03/05/15   Lendon Colonel, NP  diphenoxylate-atropine (LOMOTIL) 2.5-0.025 MG per tablet  Take 1 tablet by mouth 4 (four) times daily as needed for diarrhea or loose stools. 01/07/15   Tanna Furry, MD  famotidine (PEPCID) 20 MG tablet Take 1 tablet (20 mg total) by mouth at bedtime. 03/03/15   Rexene Alberts, MD  furosemide (LASIX) 20 MG tablet Take 20 mg by mouth as needed for fluid. Take 1 tablet as needed if 3 lb weight gain. 05/06/13   Lendon Colonel, NP  glimepiride (AMARYL) 2 MG tablet FOR YOUR DIABETES WHILE OFF OF METFORMIN, TAKE 1 TABLET TODAY AND TOMORROW. 03/03/15   Rexene Alberts, MD  isosorbide mononitrate (IMDUR) 30 MG 24 hr tablet  Take 1.5 tablets (45 mg total) by mouth daily. 03/03/15   Rexene Alberts, MD  levothyroxine (SYNTHROID, LEVOTHROID) 50 MCG tablet Take 50 mcg by mouth daily before breakfast.  09/08/14   Historical Cicily Bonano, MD  metFORMIN (GLUCOPHAGE) 500 MG tablet Take 2 tablets (1,000 mg total) by mouth 2 (two) times daily with a meal. RESTART ON Sunday, 03/04/15. 03/03/15   Rexene Alberts, MD  Multiple Vitamin (MULTIVITAMIN WITH MINERALS) TABS Take 1 tablet by mouth daily.    Historical Harish Bram, MD  nitroGLYCERIN (NITROSTAT) 0.4 MG SL tablet Place 0.4 mg under the tongue every 5 (five) minutes as needed for chest pain.    Historical Marty Uy, MD  oxyCODONE-acetaminophen (PERCOCET) 5-325 MG per tablet Take 1 tablet by mouth every 6 (six) hours as needed for moderate pain. 03/03/15   Rexene Alberts, MD  potassium chloride (K-DUR) 10 MEQ tablet Take 10 mEq by mouth as needed. Takes along with the Furosemide when he has swelling.    Historical Yael Coppess, MD  simethicone (MYLICON) 80 MG chewable tablet Chew 80 mg by mouth every 6 (six) hours as needed for flatulence.    Historical Maleko Greulich, MD  tamsulosin (FLOMAX) 0.4 MG CAPS Take 0.4 mg by mouth daily after supper.  01/27/13   Historical Jawuan Robb, MD    ALLERGIES:  No Known Allergies  SOCIAL HISTORY:  Social History  Substance Use Topics  . Smoking status: Former Smoker -- 2.00 packs/day for 10 years    Types: Cigarettes    Start date: 07/14/1950    Quit date: 07/14/1961  . Smokeless tobacco: Current User    Types: Chew  . Alcohol Use: No    FAMILY HISTORY: Family History  Problem Relation Age of Onset  . Early death      Parents died young  . Appendicitis Mother     Pt was 78 year old  . Heart attack Father 40    EXAM: BP 145/75 mmHg  Pulse 80  Temp(Src) 97.9 F (36.6 C) (Oral)  Resp 20  Ht 5\' 10"  (1.778 m)  Wt 170 lb (77.111 kg)  BMI 24.39 kg/m2  SpO2 99% CONSTITUTIONAL: Alert and oriented and responds appropriately to questions. Well-appearing;  well-nourished, elderly, in no distress HEAD: Normocephalic EYES: Conjunctivae clear, PERRL ENT: normal nose; no rhinorrhea; moist mucous membranes; pharynx without lesions noted NECK: Supple, no meningismus, no LAD  CARD: RRR; S1 and S2 appreciated; no murmurs, no clicks, no rubs, no gallops RESP: Normal chest excursion without splinting or tachypnea; breath sounds clear and equal bilaterally; no wheezes, no rhonchi, no rales, no hypoxia or respiratory distress, speaking full sentences ABD/GI: Normal bowel sounds; non-distended; soft, non-tender, no rebound, no guarding, no peritoneal signs RECTAL:  Patient has normal rectal tone, no gross blood, dark stools are guaiac negative BACK:  The back appears normal and is non-tender to palpation, there is  no CVA tenderness EXT: Normal ROM in all joints; non-tender to palpation; no edema; normal capillary refill; no cyanosis, no calf tenderness or swelling    SKIN: Normal color for age and race; warm NEURO: Moves all extremities equally, sensation to light touch intact diffusely, cranial nerves II through XII intact PSYCH: The patient's mood and manner are appropriate. Grooming and personal hygiene are appropriate.  MEDICAL DECISION MAKING: Pt here with multiple episodes of nonbloody diarrhea. He does have dark stools but has taken Pepto-Bismol and is on iron per his report (although not listed in his medication).  He is guaiac negative. His abdominal exam is completely benign. He is afebrile hemodynamically stable and very well-appearing. Will check labs and urine, give Imodium and IV fluids. I do not feel he needs emergent abdominal imaging at this time. We'll send stool cultures given he has a reported history of being hospitalized in the past for a "colon infection".  ED PROGRESS: 2:00 AM  Pt reports feeling better. Abdominal exam is still benign and he is still hemodynamically stable, afebrile, nontoxic and very well-appearing. His labs are  unremarkable. C. difficile is negative. Stool cultures are pending. I do not feel he needs to be discharged on antibiotics at this time. Urine shows no sign of infection and no ketones. I feel he is safe to be discharged home with outpatient follow-up. We'll discharge with prescription for Imodium. Discussed return precautions. He verbalized understanding and is comfortable with this plan.   I personally performed the services described in this documentation, which was scribed in my presence. The recorded information has been reviewed and is accurate.   [place], DO 04/01/15 [phone]

## 2015-04-01 LAB — COMPREHENSIVE METABOLIC PANEL
ALT: 26 U/L (ref 17–63)
ANION GAP: 8 (ref 5–15)
AST: 27 U/L (ref 15–41)
Albumin: 4.2 g/dL (ref 3.5–5.0)
Alkaline Phosphatase: 78 U/L (ref 38–126)
BILIRUBIN TOTAL: 0.6 mg/dL (ref 0.3–1.2)
BUN: 14 mg/dL (ref 6–20)
CO2: 24 mmol/L (ref 22–32)
Calcium: 9.2 mg/dL (ref 8.9–10.3)
Chloride: 106 mmol/L (ref 101–111)
Creatinine, Ser: 0.72 mg/dL (ref 0.61–1.24)
Glucose, Bld: 176 mg/dL — ABNORMAL HIGH (ref 65–99)
POTASSIUM: 4.3 mmol/L (ref 3.5–5.1)
Sodium: 138 mmol/L (ref 135–145)
TOTAL PROTEIN: 7 g/dL (ref 6.5–8.1)

## 2015-04-01 LAB — CBC WITH DIFFERENTIAL/PLATELET
Basophils Absolute: 0 10*3/uL (ref 0.0–0.1)
Basophils Relative: 0 %
EOS PCT: 3 %
Eosinophils Absolute: 0.3 10*3/uL (ref 0.0–0.7)
HEMATOCRIT: 40.7 % (ref 39.0–52.0)
Hemoglobin: 14.1 g/dL (ref 13.0–17.0)
LYMPHS PCT: 21 %
Lymphs Abs: 1.8 10*3/uL (ref 0.7–4.0)
MCH: 32.6 pg (ref 26.0–34.0)
MCHC: 34.6 g/dL (ref 30.0–36.0)
MCV: 94.2 fL (ref 78.0–100.0)
MONO ABS: 0.9 10*3/uL (ref 0.1–1.0)
MONOS PCT: 10 %
NEUTROS ABS: 5.8 10*3/uL (ref 1.7–7.7)
Neutrophils Relative %: 66 %
Platelets: 148 10*3/uL — ABNORMAL LOW (ref 150–400)
RBC: 4.32 MIL/uL (ref 4.22–5.81)
RDW: 12.7 % (ref 11.5–15.5)
WBC: 8.8 10*3/uL (ref 4.0–10.5)

## 2015-04-01 LAB — URINALYSIS, ROUTINE W REFLEX MICROSCOPIC
Bilirubin Urine: NEGATIVE
GLUCOSE, UA: NEGATIVE mg/dL
Hgb urine dipstick: NEGATIVE
KETONES UR: NEGATIVE mg/dL
LEUKOCYTES UA: NEGATIVE
NITRITE: NEGATIVE
PROTEIN: NEGATIVE mg/dL
Specific Gravity, Urine: 1.01 (ref 1.005–1.030)
UROBILINOGEN UA: 0.2 mg/dL (ref 0.0–1.0)
pH: 7.5 (ref 5.0–8.0)

## 2015-04-01 LAB — CBG MONITORING, ED: Glucose-Capillary: 170 mg/dL — ABNORMAL HIGH (ref 65–99)

## 2015-04-01 LAB — C DIFFICILE QUICK SCREEN W PCR REFLEX
C DIFFICILE (CDIFF) INTERP: NEGATIVE
C DIFFICILE (CDIFF) TOXIN: NEGATIVE
C DIFFICLE (CDIFF) ANTIGEN: NEGATIVE

## 2015-04-01 MED ORDER — LOPERAMIDE HCL 2 MG PO CAPS: 2.0000 mg | ORAL_CAPSULE | Freq: Four times a day (QID) | ORAL | Status: DC | PRN

## 2015-04-01 NOTE — Discharge Instructions (Signed)
Diarrhea  Diarrhea is frequent loose and watery bowel movements. It can cause you to feel weak and dehydrated. Dehydration can cause you to become tired and thirsty, have a dry mouth, and have decreased urination that often is dark yellow. Diarrhea is a sign of another problem, most often an infection that will not last long. In most cases, diarrhea typically lasts 2-3 days. However, it can last longer if it is a sign of something more serious. It is important to treat your diarrhea as directed by your caregiver to lessen or prevent future episodes of diarrhea.  CAUSES   Some common causes include:  · Gastrointestinal infections caused by viruses, bacteria, or parasites.  · Food poisoning or food allergies.  · Certain medicines, such as antibiotics, chemotherapy, and laxatives.  · Artificial sweeteners and fructose.  · Digestive disorders.  HOME CARE INSTRUCTIONS  · Ensure adequate fluid intake (hydration): Have 1 cup (8 oz) of fluid for each diarrhea episode. Avoid fluids that contain simple sugars or sports drinks, fruit juices, whole milk products, and sodas. Your urine should be clear or pale yellow if you are drinking enough fluids. Hydrate with an oral rehydration solution that you can purchase at pharmacies, retail stores, and online. You can prepare an oral rehydration solution at home by mixing the following ingredients together:  ¨  - tsp table salt.  ¨ ¾ tsp baking soda.  ¨  tsp salt substitute containing potassium chloride.  ¨ 1  tablespoons sugar.  ¨ 1 L (34 oz) of water.  · Certain foods and beverages may increase the speed at which food moves through the gastrointestinal (GI) tract. These foods and beverages should be avoided and include:  ¨ Caffeinated and alcoholic beverages.  ¨ High-fiber foods, such as raw fruits and vegetables, nuts, seeds, and whole grain breads and cereals.  ¨ Foods and beverages sweetened with sugar alcohols, such as xylitol, sorbitol, and mannitol.  · Some foods may be well  tolerated and may help thicken stool including:  ¨ Starchy foods, such as rice, toast, pasta, low-sugar cereal, oatmeal, grits, baked potatoes, crackers, and bagels.  ¨ Bananas.  ¨ Applesauce.  · Add probiotic-rich foods to help increase healthy bacteria in the GI tract, such as yogurt and fermented milk products.  · Wash your hands well after each diarrhea episode.  · Only take over-the-counter or prescription medicines as directed by your caregiver.  · Take a warm bath to relieve any burning or pain from frequent diarrhea episodes.  SEEK IMMEDIATE MEDICAL CARE IF:   · You are unable to keep fluids down.  · You have persistent vomiting.  · You have blood in your stool, or your stools are black and tarry.  · You do not urinate in 6-8 hours, or there is only a small amount of very dark urine.  · You have abdominal pain that increases or localizes.  · You have weakness, dizziness, confusion, or light-headedness.  · You have a severe headache.  · Your diarrhea gets worse or does not get better.  · You have a fever or persistent symptoms for more than 2-3 days.  · You have a fever and your symptoms suddenly get worse.  MAKE SURE YOU:   · Understand these instructions.  · Will watch your condition.  · Will get help right away if you are not doing well or get worse.  Document Released: 06/20/2002 Document Revised: 11/14/2013 Document Reviewed: 03/07/2012  ExitCare® Patient Information ©2015 [employer]. This information   is not intended to replace advice given to you by your health care provider. Make sure you discuss any questions you have with your health care provider.  Food Choices to Help Relieve Diarrhea  When you have diarrhea, the foods you eat and your eating habits are very important. Choosing the right foods and drinks can help relieve diarrhea. Also, because diarrhea can last up to 7 days, you need to replace lost fluids and electrolytes (such as sodium, potassium, and chloride) in order to help prevent  dehydration.   WHAT GENERAL GUIDELINES DO I NEED TO FOLLOW?  · Slowly drink 1 cup (8 oz) of fluid for each episode of diarrhea. If you are getting enough fluid, your urine will be clear or pale yellow.  · Eat starchy foods. Some good choices include white rice, white toast, pasta, low-fiber cereal, baked potatoes (without the skin), saltine crackers, and bagels.  · Avoid large servings of any cooked vegetables.  · Limit fruit to two servings per day. A serving is ½ cup or 1 small piece.  · Choose foods with less than 2 g of fiber per serving.  · Limit fats to less than 8 tsp (38 g) per day.  · Avoid fried foods.  · Eat foods that have probiotics in them. Probiotics can be found in certain dairy products.  · Avoid foods and beverages that may increase the speed at which food moves through the stomach and intestines (gastrointestinal tract). Things to avoid include:  ¨ High-fiber foods, such as dried fruit, raw fruits and vegetables, nuts, seeds, and whole grain foods.  ¨ Spicy foods and high-fat foods.  ¨ Foods and beverages sweetened with high-fructose corn syrup, honey, or sugar alcohols such as xylitol, sorbitol, and mannitol.  WHAT FOODS ARE RECOMMENDED?  Grains  White rice. White, French, or pita breads (fresh or toasted), including plain rolls, buns, or bagels. White pasta. Saltine, soda, or graham crackers. Pretzels. Low-fiber cereal. Cooked cereals made with water (such as cornmeal, farina, or cream cereals). Plain muffins. Matzo. Melba toast. Zwieback.   Vegetables  Potatoes (without the skin). Strained tomato and vegetable juices. Most well-cooked and canned vegetables without seeds. Tender lettuce.  Fruits  Cooked or canned applesauce, apricots, cherries, fruit cocktail, grapefruit, peaches, pears, or plums. Fresh bananas, apples without skin, cherries, grapes, cantaloupe, grapefruit, peaches, oranges, or plums.   Meat and Other Protein Products  Baked or boiled chicken. Eggs. Tofu. Fish. Seafood. Smooth  peanut butter. Ground or well-cooked tender beef, ham, veal, lamb, pork, or poultry.   Dairy  Plain yogurt, kefir, and unsweetened liquid yogurt. Lactose-free milk, buttermilk, or soy milk. Plain hard cheese.  Beverages  Sport drinks. Clear broths. Diluted fruit juices (except prune). Regular, caffeine-free sodas such as ginger ale. Water. Decaffeinated teas. Oral rehydration solutions. Sugar-free beverages not sweetened with sugar alcohols.  Other  Bouillon, broth, or soups made from recommended foods.   The items listed above may not be a complete list of recommended foods or beverages. Contact your dietitian for more options.  WHAT FOODS ARE NOT RECOMMENDED?  Grains  Whole grain, whole wheat, bran, or rye breads, rolls, pastas, crackers, and cereals. Wild or brown rice. Cereals that contain more than 2 g of fiber per serving. Corn tortillas or taco shells. Cooked or dry oatmeal. Granola. Popcorn.  Vegetables  Raw vegetables. Cabbage, broccoli, Brussels sprouts, artichokes, baked beans, beet greens, corn, kale, legumes, peas, sweet potatoes, and yams. Potato skins. Cooked spinach and cabbage.  Fruits  Dried   fruit, including raisins and dates. Raw fruits. Stewed or dried prunes. Fresh apples with skin, apricots, mangoes, pears, raspberries, and strawberries.   Meat and Other Protein Products  Chunky peanut butter. Nuts and seeds. Beans and lentils. Bacon.   Dairy  High-fat cheeses. Milk, chocolate milk, and beverages made with milk, such as milk shakes. Cream. Ice cream.  Sweets and Desserts  Sweet rolls, doughnuts, and sweet breads. Pancakes and waffles.  Fats and Oils  Butter. Cream sauces. Margarine. Salad oils. Plain salad dressings. Olives. Avocados.   Beverages  Caffeinated beverages (such as coffee, tea, soda, or energy drinks). Alcoholic beverages. Fruit juices with pulp. Prune juice. Soft drinks sweetened with high-fructose corn syrup or sugar alcohols.  Other  Coconut. Hot sauce. Chili powder. Mayonnaise.  Gravy. Cream-based or milk-based soups.   The items listed above may not be a complete list of foods and beverages to avoid. Contact your dietitian for more information.  WHAT SHOULD I DO IF I BECOME DEHYDRATED?  Diarrhea can sometimes lead to dehydration. Signs of dehydration include dark urine and dry mouth and skin. If you think you are dehydrated, you should rehydrate with an oral rehydration solution. These solutions can be purchased at pharmacies, retail stores, or online.   Drink ½-1 cup (120-240 mL) of oral rehydration solution each time you have an episode of diarrhea. If drinking this amount makes your diarrhea worse, try drinking smaller amounts more often. For example, drink 1-3 tsp (5-15 mL) every 5-10 minutes.   A general rule for staying hydrated is to drink 1½-2 L of fluid per day. Talk to your health care provider about the specific amount you should be drinking each day. Drink enough fluids to keep your urine clear or pale yellow.  Document Released: 09/20/2003 Document Revised: 07/05/2013 Document Reviewed: 05/23/2013  ExitCare® Patient Information ©2015 [employer]. This information is not intended to replace advice given to you by your health care provider. Make sure you discuss any questions you have with your health care provider.

## 2015-04-01 NOTE — ED Notes (Signed)
Pt ambulated to restroom & returned to room w/ no complications. 

## 2015-04-01 NOTE — ED Notes (Signed)
Pt alert & oriented x4, stable gait. Patient  given discharge instructions, paperwork & prescription(s).  Patient verbalized understanding. Pt left department in wheelchair w/ no further questions. 

## 2015-04-02 ENCOUNTER — Encounter (HOSPITAL_COMMUNITY): Payer: Self-pay | Admitting: *Deleted

## 2015-04-02 ENCOUNTER — Emergency Department (HOSPITAL_COMMUNITY)
Admission: EM | Admit: 2015-04-02 | Discharge: 2015-04-03 | Disposition: A | Discharge status: Home / Self Care | Payer: Medicare Other | Attending: Emergency Medicine | Admitting: Emergency Medicine

## 2015-04-02 DIAGNOSIS — I252 Old myocardial infarction: Secondary | ICD-10-CM | POA: Diagnosis not present

## 2015-04-02 DIAGNOSIS — Z8711 Personal history of peptic ulcer disease: Secondary | ICD-10-CM | POA: Diagnosis not present

## 2015-04-02 DIAGNOSIS — Z951 Presence of aortocoronary bypass graft: Secondary | ICD-10-CM | POA: Insufficient documentation

## 2015-04-02 DIAGNOSIS — Z9889 Other specified postprocedural states: Secondary | ICD-10-CM | POA: Insufficient documentation

## 2015-04-02 DIAGNOSIS — I2510 Atherosclerotic heart disease of native coronary artery without angina pectoris: Secondary | ICD-10-CM | POA: Diagnosis not present

## 2015-04-02 DIAGNOSIS — E039 Hypothyroidism, unspecified: Secondary | ICD-10-CM | POA: Diagnosis not present

## 2015-04-02 DIAGNOSIS — Z9861 Coronary angioplasty status: Secondary | ICD-10-CM | POA: Diagnosis not present

## 2015-04-02 DIAGNOSIS — Z8781 Personal history of (healed) traumatic fracture: Secondary | ICD-10-CM | POA: Diagnosis not present

## 2015-04-02 DIAGNOSIS — Z8673 Personal history of transient ischemic attack (TIA), and cerebral infarction without residual deficits: Secondary | ICD-10-CM | POA: Insufficient documentation

## 2015-04-02 DIAGNOSIS — M1990 Unspecified osteoarthritis, unspecified site: Secondary | ICD-10-CM | POA: Diagnosis not present

## 2015-04-02 DIAGNOSIS — N400 Enlarged prostate without lower urinary tract symptoms: Secondary | ICD-10-CM | POA: Insufficient documentation

## 2015-04-02 DIAGNOSIS — G8929 Other chronic pain: Secondary | ICD-10-CM | POA: Insufficient documentation

## 2015-04-02 DIAGNOSIS — I10 Essential (primary) hypertension: Secondary | ICD-10-CM | POA: Diagnosis not present

## 2015-04-02 DIAGNOSIS — Z7982 Long term (current) use of aspirin: Secondary | ICD-10-CM | POA: Diagnosis not present

## 2015-04-02 DIAGNOSIS — K219 Gastro-esophageal reflux disease without esophagitis: Secondary | ICD-10-CM | POA: Diagnosis not present

## 2015-04-02 DIAGNOSIS — Z87891 Personal history of nicotine dependence: Secondary | ICD-10-CM | POA: Insufficient documentation

## 2015-04-02 DIAGNOSIS — Z8701 Personal history of pneumonia (recurrent): Secondary | ICD-10-CM | POA: Insufficient documentation

## 2015-04-02 DIAGNOSIS — E119 Type 2 diabetes mellitus without complications: Secondary | ICD-10-CM | POA: Insufficient documentation

## 2015-04-02 DIAGNOSIS — R079 Chest pain, unspecified: Secondary | ICD-10-CM | POA: Insufficient documentation

## 2015-04-02 DIAGNOSIS — Z79899 Other long term (current) drug therapy: Secondary | ICD-10-CM | POA: Diagnosis not present

## 2015-04-02 LAB — APTT: APTT: 27 s (ref 24–37)

## 2015-04-02 LAB — DIFFERENTIAL
BASOS PCT: 0 %
Basophils Absolute: 0 10*3/uL (ref 0.0–0.1)
EOS ABS: 0.3 10*3/uL (ref 0.0–0.7)
Eosinophils Relative: 4 %
LYMPHS ABS: 1.8 10*3/uL (ref 0.7–4.0)
Lymphocytes Relative: 23 %
MONO ABS: 0.8 10*3/uL (ref 0.1–1.0)
MONOS PCT: 10 %
Neutro Abs: 5 10*3/uL (ref 1.7–7.7)
Neutrophils Relative %: 63 %

## 2015-04-02 LAB — BASIC METABOLIC PANEL
ANION GAP: 7 (ref 5–15)
BUN: 14 mg/dL (ref 6–20)
CHLORIDE: 105 mmol/L (ref 101–111)
CO2: 23 mmol/L (ref 22–32)
Calcium: 8.8 mg/dL — ABNORMAL LOW (ref 8.9–10.3)
Creatinine, Ser: 0.9 mg/dL (ref 0.61–1.24)
GFR calc Af Amer: >60 mL/min (ref >60)
GLUCOSE: 207 mg/dL — AB (ref 65–99)
POTASSIUM: 4.1 mmol/L (ref 3.5–5.1)
Sodium: 135 mmol/L (ref 135–145)

## 2015-04-02 LAB — CBC
HEMATOCRIT: 38.7 % — AB (ref 39.0–52.0)
HEMOGLOBIN: 13.2 g/dL (ref 13.0–17.0)
MCH: 32.2 pg (ref 26.0–34.0)
MCHC: 34.1 g/dL (ref 30.0–36.0)
MCV: 94.4 fL (ref 78.0–100.0)
Platelets: 151 10*3/uL (ref 150–400)
RBC: 4.1 MIL/uL — ABNORMAL LOW (ref 4.22–5.81)
RDW: 12.8 % (ref 11.5–15.5)
WBC: 7.9 10*3/uL (ref 4.0–10.5)

## 2015-04-02 LAB — PROTIME-INR
INR: 1.02 (ref 0.00–1.49)
Prothrombin Time: 13.6 seconds (ref 11.6–15.2)

## 2015-04-02 LAB — TROPONIN I

## 2015-04-02 NOTE — ED Provider Notes (Signed)
CSN: [id number]     Arrival date & time 04/02/15  2130 History  This chart was scribe for Tanna Furry, MD by Judithann Sauger, ED Scribe. The patient was seen in room APA03/APA03 and the patient's care was started at 10:02 PM.    Chief Complaint  Patient presents with  . Chest Pain   The history is provided by the patient. No language interpreter was used.   HPI Comments: Jeffrey [name] is a 81 y.o. male who presents to the Emergency Department complaining of mid sternal CP onset this pm after waking up from his nap. He denies any acute nausea and SOB. He reports that he has taken 325mg  aspirin PTA. He states that he has NTG but has not taken any. He reports that he ate ice cream before bed last night.   Past Medical History  Diagnosis Date  . Diabetes mellitus, type II   . Hyperlipidemia   . Coronary atherosclerosis of native coronary artery     a. CABG x 4 in 1989 (VG->OM1->OM2, VG->RCA, LIMA->LAD), b. 05/2010: DES to VG-OM1/OM2, DES to distal LCx. c. NSTEMI in 04/2011 - TO distal LCX stent and VG->OM2. d. 02/2012 NSTEMI DES to VG-OM1/continuation to OM2 occluded. e. inferior STEMI s/p DES to SVG-RAMUS 06/2012. f. inferolat STEMI 09/2012 s/p DES to SVG-interm; g. Lex MV (11/14):  EF 35%, inf-lat scar with small peri-infarct ischemia  . Essential hypertension, benign   . Osteoarthritis   . History of stroke   . History of pneumonia   . Cervical vertebral fracture   . Chronic back pain   . Benign prostatic hypertrophy     History of urinary retention  . Peptic ulcer disease   . Gastroesophageal reflux disease   . Ischemic cardiomyopathy Nov 2015    EF 35% cath, 45-50% by echo  . Hypothyroidism    Past Surgical History  Procedure Laterality Date  . Tonsillectomy    . Coronary artery bypass graft  1989  . Coronary angioplasty  10/12, 8/13, 12/13, 3/14    SVG-OM PCI  . Cardiac catheterization  05/19/14    SVG-OM occl- medical Rx  . Left heart catheterization with coronary  angiogram N/A 11/01/2011    Procedure: LEFT HEART CATHETERIZATION WITH CORONARY ANGIOGRAM;  Surgeon: Lorretta Harp, MD;  Location: [facility] [place] CATH LAB;  Service: Cardiovascular;  Laterality: N/A;  . Percutaneous coronary stent intervention (pci-s) N/A 11/01/2011    Procedure: PERCUTANEOUS CORONARY STENT INTERVENTION (PCI-S);  Surgeon: Lorretta Harp, MD;  Location: [facility] CATH LAB;  Service: Cardiovascular;  Laterality: N/A;  . Left heart catheterization with coronary/graft angiogram N/A 03/10/2012    Procedure: LEFT HEART CATHETERIZATION WITH [name];  Surgeon: Sherren Mocha, MD;  Location: [facility] CATH LAB;  Service: Cardiovascular;  Laterality: N/A;  . Left heart catheterization with coronary angiogram N/A 06/18/2012    Procedure: LEFT HEART CATHETERIZATION WITH CORONARY ANGIOGRAM;  Surgeon: Peter M Martinique, MD;  Location: [facility] CATH LAB;  Service: Cardiovascular;  Laterality: N/A;  . Percutaneous coronary stent intervention (pci-s)  06/18/2012    Procedure: PERCUTANEOUS CORONARY STENT INTERVENTION (PCI-S);  Surgeon: Peter M Martinique, MD;  Location: [facility] CATH LAB;  Service: Cardiovascular;;  . Left heart catheterization with coronary/graft angiogram  10/06/2012    Procedure: LEFT HEART CATHETERIZATION WITH [name];  Surgeon: Burnell Blanks, MD;  Location: [facility] CATH LAB;  Service: Cardiovascular;;  . Percutaneous coronary stent intervention (pci-s)  10/06/2012    Procedure: PERCUTANEOUS CORONARY STENT INTERVENTION (PCI-S);  Surgeon: Annita Brod  Angelena Form, MD;  Location: [place] CATH LAB;  Service: Cardiovascular;;  . Left heart catheterization with coronary/graft angiogram N/A 11/09/2012    Procedure: LEFT HEART CATHETERIZATION WITH [name];  Surgeon: Peter M Martinique, MD;  Location: [facility] CATH LAB;  Service: Cardiovascular;  Laterality: N/A;  . Left heart catheterization with coronary/graft angiogram N/A 05/19/2014    Procedure: LEFT HEART CATHETERIZATION WITH [name];  Surgeon: Troy Sine, MD;  Location: [facility] CATH LAB;  Service: Cardiovascular;  Laterality: N/A;   Family History  Problem Relation Age of Onset  . Early death      Parents died young  . Appendicitis Mother     Pt was 27 year old  . Heart attack Father 50   Social History  Substance Use Topics  . Smoking status: Former Smoker -- 2.00 packs/day for 10 years    Types: Cigarettes    Start date: 07/14/1950    Quit date: 07/14/1961  . Smokeless tobacco: Current User    Types: Chew  . Alcohol Use: No    Review of Systems  Constitutional: Negative for chills and appetite change.  HENT: Negative for mouth sores and sore throat.   Eyes: Negative for visual disturbance.  Respiratory: Negative for chest tightness and wheezing.   Cardiovascular: Positive for chest pain.  Gastrointestinal: Negative for diarrhea and abdominal distention.  Endocrine: Negative for polydipsia, polyphagia and polyuria.  Genitourinary: Negative for dysuria, frequency and hematuria.  Musculoskeletal: Negative for gait problem.  Skin: Negative for color change, pallor and rash.  Neurological: Negative for syncope and light-headedness.  Hematological: Does not bruise/bleed easily.  Psychiatric/Behavioral: Negative for behavioral problems and confusion.      Allergies  Review of patient's allergies indicates no known allergies.  Home Medications   Prior to Admission medications   Medication Sig Start Date End Date Taking? Authorizing Provider  acetaminophen (TYLENOL) 325 MG tablet Take 2 tablets (650 mg total) by mouth every 4 (four) hours as needed for headache or mild pain. Patient taking differently: Take 325 mg by mouth 2 (two) times daily as needed for headache or mild pain.  05/21/14  Yes Luke K Kilroy, PA-C  albuterol (PROVENTIL HFA;VENTOLIN HFA) 108 (90 BASE) MCG/ACT inhaler Inhale 2 puffs into the lungs every 6 (six) hours as needed for wheezing or shortness of breath.   Yes Historical  Provider, MD  aspirin EC 81 MG tablet Take 81 mg by mouth daily.   Yes Historical Provider, MD  atorvastatin (LIPITOR) 40 MG tablet Take 1 tablet (40 mg total) by mouth daily at 6 PM. 05/29/13  Yes Scott T Kathlen Mody, PA-C  bismuth subsalicylate (PEPTO BISMOL) 262 MG/15ML suspension Take 30 mLs by mouth every 6 (six) hours as needed for indigestion or diarrhea or loose stools.    Yes Historical Provider, MD  BRILINTA 90 MG TABS tablet TAKE 1 TABLET BY MOUTH TWICE DAILY. 01/16/15  Yes Herminio Commons, MD  carvedilol (COREG) 3.125 MG tablet TAKE 1 TABLET BY MOUTH TWICE DAILY WITH MEALS. (HEART RATE & BLOOD PRESSURE) 03/05/15  Yes Lendon Colonel, NP  famotidine (PEPCID) 20 MG tablet Take 1 tablet (20 mg total) by mouth at bedtime. 03/03/15  Yes Rexene Alberts, MD  furosemide (LASIX) 20 MG tablet Take 20 mg by mouth as needed for fluid. Take 1 tablet as needed if 3 lb weight gain. 05/06/13  Yes Lendon Colonel, NP  isosorbide mononitrate (IMDUR) 30 MG 24 hr tablet Take 1.5 tablets (45 mg total) by mouth  daily. 03/03/15  Yes Rexene Alberts, MD  levothyroxine (SYNTHROID, LEVOTHROID) 50 MCG tablet Take 50 mcg by mouth daily before breakfast.  09/08/14  Yes Historical Provider, MD  loperamide (IMODIUM) 2 MG capsule Take 1 capsule (2 mg total) by mouth 4 (four) times daily as needed for diarrhea or loose stools. 04/01/15  Yes Kristen N Ward, DO  metFORMIN (GLUCOPHAGE) 500 MG tablet Take 2 tablets (1,000 mg total) by mouth 2 (two) times daily with a meal. RESTART ON Sunday, 03/04/15. Patient taking differently: Take 1,000 mg by mouth 2 (two) times daily with a meal.  03/03/15  Yes Rexene Alberts, MD  Multiple Vitamin (MULTIVITAMIN WITH MINERALS) TABS Take 1 tablet by mouth daily.   Yes Historical Provider, MD  nitroGLYCERIN (NITROSTAT) 0.4 MG SL tablet Place 0.4 mg under the tongue every 5 (five) minutes as needed for chest pain.   Yes Historical Provider, MD  oxyCODONE-acetaminophen (PERCOCET) 5-325 MG per tablet  Take 1 tablet by mouth every 6 (six) hours as needed for moderate pain. 03/03/15  Yes Rexene Alberts, MD  potassium chloride (K-DUR) 10 MEQ tablet Take 10 mEq by mouth as needed. Takes along with the Furosemide when he has swelling.   Yes Historical Provider, MD  tamsulosin (FLOMAX) 0.4 MG CAPS Take 0.4 mg by mouth daily after supper.  01/27/13  Yes Historical Provider, MD  diphenoxylate-atropine (LOMOTIL) 2.5-0.025 MG per tablet Take 1 tablet by mouth 4 (four) times daily as needed for diarrhea or loose stools. Patient not taking: Reported on 04/02/2015 01/07/15   Tanna Furry, MD  glimepiride (AMARYL) 2 MG tablet FOR YOUR DIABETES WHILE OFF OF METFORMIN, TAKE 1 TABLET TODAY AND TOMORROW. Patient not taking: Reported on 04/02/2015 03/03/15   Rexene Alberts, MD   BP 114/65 mmHg  Pulse 68  Temp(Src) 97.4 F (36.3 C) (Oral)  Resp 19  Ht 5\' 10"  (1.778 m)  Wt 170 lb (77.111 kg)  BMI 24.39 kg/m2  SpO2 97% Physical Exam  Constitutional: He is oriented to person, place, and time. He appears well-developed and well-nourished. No distress.  HENT:  Head: Normocephalic.  Eyes: Conjunctivae are normal. Pupils are equal, round, and reactive to light. No scleral icterus.  Neck: Normal range of motion. Neck supple. No thyromegaly present.  Cardiovascular: Normal rate and regular rhythm.  Exam reveals no gallop and no friction rub.   No murmur heard. Pulmonary/Chest: Effort normal and breath sounds normal. No respiratory distress. He has no wheezes. He has no rales.  Abdominal: Soft. Bowel sounds are normal. He exhibits no distension. There is no tenderness. There is no rebound.  Musculoskeletal: Normal range of motion.  Neurological: He is alert and oriented to person, place, and time.  Skin: Skin is warm and dry. No rash noted.  Psychiatric: He has a normal mood and affect. His behavior is normal.  Nursing note and vitals reviewed.   ED Course  Procedures (including critical care time) DIAGNOSTIC  STUDIES: Oxygen Saturation is 98% on RA, normal by my interpretation.    COORDINATION OF CARE: 10:06 PM- Pt advised of plan for treatment and pt agrees.    Labs Review Labs Reviewed  CBC - Abnormal; Notable for the following:    RBC 4.10 (*)    HCT 38.7 (*)    All other components within normal limits  BASIC METABOLIC PANEL - Abnormal; Notable for the following:    Glucose, Bld 207 (*)    Calcium 8.8 (*)    All other components within normal limits  DIFFERENTIAL  PROTIME-INR  APTT  TROPONIN I  TROPONIN I    Imaging Review No results found. Tanna Furry, MD has personally reviewed and evaluated these images and lab results as part of my medical decision-making.   EKG Interpretation None      MDM   Final diagnoses:  Chest pain, unspecified chest pain type    Mr. Towson per usual seems somewhat anxious. He speaks found having spaghetti and ice cream before laying down the bed. Has an unchanged EKG and normal enzymes. His appropriate for home.   I personally performed the services described in this documentation, which was scribed in my presence. The recorded information has been reviewed and is accurate.    Tanna Furry, MD 04/03/15 [phone]

## 2015-04-02 NOTE — ED Notes (Signed)
Pt states that he took one 325mg  asa prior to arrival in er,

## 2015-04-02 NOTE — ED Notes (Signed)
Pt woke up from sleep with sharp mid sternal chest pain, took one 325 mg asa prior to arrival in er, pt NSR on monitor, states that his nausea and sob are not different than his "Normal"

## 2015-04-02 NOTE — ED Notes (Signed)
Pt c/o chest pain that woke him up from a nap this afternoon

## 2015-04-02 NOTE — ED Notes (Signed)
Dr Mauro at bedside,  

## 2015-04-03 LAB — TROPONIN I: Troponin I: <0.03 ng/mL (ref <0.031)

## 2015-04-03 NOTE — Discharge Instructions (Signed)
Avoid eating within 1-2 hours of laying down to bed to avoid indigestion or reflux.  Follow-up with Dr. Riley Kill.

## 2015-04-05 LAB — STOOL CULTURE

## 2015-04-16 ENCOUNTER — Encounter (HOSPITAL_COMMUNITY): Payer: Self-pay | Admitting: *Deleted

## 2015-04-16 ENCOUNTER — Emergency Department (HOSPITAL_COMMUNITY)
Admission: EM | Admit: 2015-04-16 | Discharge: 2015-04-17 | Disposition: A | Discharge status: Home / Self Care | Payer: Medicare Other | Attending: Emergency Medicine | Admitting: Emergency Medicine

## 2015-04-16 DIAGNOSIS — E119 Type 2 diabetes mellitus without complications: Secondary | ICD-10-CM | POA: Insufficient documentation

## 2015-04-16 DIAGNOSIS — R42 Dizziness and giddiness: Secondary | ICD-10-CM | POA: Diagnosis not present

## 2015-04-16 DIAGNOSIS — R0602 Shortness of breath: Secondary | ICD-10-CM | POA: Insufficient documentation

## 2015-04-16 DIAGNOSIS — I10 Essential (primary) hypertension: Secondary | ICD-10-CM | POA: Insufficient documentation

## 2015-04-16 DIAGNOSIS — Z8701 Personal history of pneumonia (recurrent): Secondary | ICD-10-CM | POA: Insufficient documentation

## 2015-04-16 DIAGNOSIS — Z79899 Other long term (current) drug therapy: Secondary | ICD-10-CM | POA: Diagnosis not present

## 2015-04-16 DIAGNOSIS — M1990 Unspecified osteoarthritis, unspecified site: Secondary | ICD-10-CM | POA: Diagnosis not present

## 2015-04-16 DIAGNOSIS — R079 Chest pain, unspecified: Secondary | ICD-10-CM | POA: Insufficient documentation

## 2015-04-16 DIAGNOSIS — Z8711 Personal history of peptic ulcer disease: Secondary | ICD-10-CM | POA: Insufficient documentation

## 2015-04-16 DIAGNOSIS — Z87891 Personal history of nicotine dependence: Secondary | ICD-10-CM | POA: Diagnosis not present

## 2015-04-16 DIAGNOSIS — E785 Hyperlipidemia, unspecified: Secondary | ICD-10-CM | POA: Insufficient documentation

## 2015-04-16 DIAGNOSIS — N400 Enlarged prostate without lower urinary tract symptoms: Secondary | ICD-10-CM | POA: Diagnosis not present

## 2015-04-16 DIAGNOSIS — I2510 Atherosclerotic heart disease of native coronary artery without angina pectoris: Secondary | ICD-10-CM | POA: Diagnosis not present

## 2015-04-16 DIAGNOSIS — Z951 Presence of aortocoronary bypass graft: Secondary | ICD-10-CM | POA: Diagnosis not present

## 2015-04-16 DIAGNOSIS — G8929 Other chronic pain: Secondary | ICD-10-CM | POA: Insufficient documentation

## 2015-04-16 DIAGNOSIS — Z9889 Other specified postprocedural states: Secondary | ICD-10-CM | POA: Diagnosis not present

## 2015-04-16 DIAGNOSIS — Z7982 Long term (current) use of aspirin: Secondary | ICD-10-CM | POA: Diagnosis not present

## 2015-04-16 DIAGNOSIS — K219 Gastro-esophageal reflux disease without esophagitis: Secondary | ICD-10-CM | POA: Insufficient documentation

## 2015-04-16 DIAGNOSIS — E039 Hypothyroidism, unspecified: Secondary | ICD-10-CM | POA: Insufficient documentation

## 2015-04-16 DIAGNOSIS — Z9861 Coronary angioplasty status: Secondary | ICD-10-CM | POA: Diagnosis not present

## 2015-04-16 LAB — CBC WITH DIFFERENTIAL/PLATELET
Basophils Absolute: 0 10*3/uL (ref 0.0–0.1)
Basophils Relative: 0 %
Eosinophils Absolute: 0.3 10*3/uL (ref 0.0–0.7)
Eosinophils Relative: 4 %
HEMATOCRIT: 39.3 % (ref 39.0–52.0)
HEMOGLOBIN: 13.3 g/dL (ref 13.0–17.0)
LYMPHS ABS: 1.7 10*3/uL (ref 0.7–4.0)
Lymphocytes Relative: 24 %
MCH: 32.2 pg (ref 26.0–34.0)
MCHC: 33.8 g/dL (ref 30.0–36.0)
MCV: 95.2 fL (ref 78.0–100.0)
MONO ABS: 0.7 10*3/uL (ref 0.1–1.0)
MONOS PCT: 11 %
NEUTROS ABS: 4.2 10*3/uL (ref 1.7–7.7)
NEUTROS PCT: 61 %
Platelets: 150 10*3/uL (ref 150–400)
RBC: 4.13 MIL/uL — ABNORMAL LOW (ref 4.22–5.81)
RDW: 13 % (ref 11.5–15.5)
WBC: 6.9 10*3/uL (ref 4.0–10.5)

## 2015-04-16 NOTE — ED Notes (Signed)
Pt reporting dizziness previously, states that he took some medication earlier and is feeling some better.  Also reporting some previous chest pain, although denies any at present time.  Denies nausea or vomiting.  Reporting mild SOB.

## 2015-04-16 NOTE — ED Provider Notes (Signed)
CSN: [id number]     Arrival date & time 04/16/15  2226 History   By signing my name below, I, Forrestine Him, attest that this documentation has been prepared under the direction and in the presence of Rolland Porter, MD. Electronically Signed: Forrestine Him, ED Scribe. 04/16/2015. 11:20 PM.   Chief Complaint  Patient presents with  . Dizziness   The history is provided by the patient. No language interpreter was used.    HPI Comments: Jeffrey [name] is a 81 y.o. male with a PMHx of DM and hyperlipidemia who presents to the Emergency Department here after an episode of dizziness- feeling as though he could pass out onset about 9:30 PM. He denies a spinning sensation. He denies loss of consciousness. Pt states as he was in the bathroom after noting dizziness, he sat down hard on the toilet but denies any actual fall. Mild episode of chest pain described as chest tightness that lasted a few minutes also reported at time of dizziness. One dose of motion sickness pill and a sinus pill taken at 10:30 PM this evening with some improvement. Mr. Hausman states he didn't feel like himself earlier this evening prior to incident. Blood pressure and blood sugar checked at 7:00 PM with readings of 150/75 and 210. He states he took his regular evening medications and he thinks that made him feel better. Denies any recent fever, chills, nausea, vomiting, or abdominal pain. Patient states he feels good now. He denies headache, numbness or tingling of his extremities. Patient admits he gets scared when he thinks he is getting sick.  PCP Dr Gerarda [facility] [place]  Past Medical History  Diagnosis Date  . Diabetes mellitus, type II ([place])   . Hyperlipidemia   . Coronary atherosclerosis of native coronary artery     a. CABG x 4 in 1989 (VG->OM1->OM2, VG->RCA, LIMA->LAD), b. 05/2010: DES to VG-OM1/OM2, DES to distal LCx. c. NSTEMI in 04/2011 - TO distal LCX stent and VG->OM2. d. 02/2012 NSTEMI DES to VG-OM1/continuation  to OM2 occluded. e. inferior STEMI s/p DES to SVG-RAMUS 06/2012. f. inferolat STEMI 09/2012 s/p DES to SVG-interm; g. Lex MV (11/14):  EF 35%, inf-lat scar with small peri-infarct ischemia  . Essential hypertension, benign   . Osteoarthritis   . History of stroke   . History of pneumonia   . Cervical vertebral fracture ([place])   . Chronic back pain   . Benign prostatic hypertrophy     History of urinary retention  . Peptic ulcer disease   . Gastroesophageal reflux disease   . Ischemic cardiomyopathy Nov 2015    EF 35% cath, 45-50% by echo  . Hypothyroidism    Past Surgical History  Procedure Laterality Date  . Tonsillectomy    . Coronary artery bypass graft  1989  . Coronary angioplasty  10/12, 8/13, 12/13, 3/14    SVG-OM PCI  . Cardiac catheterization  05/19/14    SVG-OM occl- medical Rx  . Left heart catheterization with coronary angiogram N/A 11/01/2011    Procedure: LEFT HEART CATHETERIZATION WITH CORONARY ANGIOGRAM;  Surgeon: Lorretta Harp, MD;  Location: [facility] CATH LAB;  Service: Cardiovascular;  Laterality: N/A;  . Percutaneous coronary stent intervention (pci-s) N/A 11/01/2011    Procedure: PERCUTANEOUS CORONARY STENT INTERVENTION (PCI-S);  Surgeon: Lorretta Harp, MD;  Location: [facility] CATH LAB;  Service: Cardiovascular;  Laterality: N/A;  . Left heart catheterization with coronary/graft angiogram N/A 03/10/2012    Procedure: LEFT HEART CATHETERIZATION WITH CORONARY/GRAFT ANGIOGRAM;  Surgeon:  Sherren Mocha, MD;  Location: [facility] CATH LAB;  Service: Cardiovascular;  Laterality: N/A;  . Left heart catheterization with coronary angiogram N/A 06/18/2012    Procedure: LEFT HEART CATHETERIZATION WITH CORONARY ANGIOGRAM;  Surgeon: Peter M Martinique, MD;  Location: [facility] CATH LAB;  Service: Cardiovascular;  Laterality: N/A;  . Percutaneous coronary stent intervention (pci-s)  06/18/2012    Procedure: PERCUTANEOUS CORONARY STENT INTERVENTION (PCI-S);  Surgeon: Peter M Martinique, MD;  Location: [facility] CATH LAB;   Service: Cardiovascular;;  . Left heart catheterization with coronary/graft angiogram  10/06/2012    Procedure: LEFT HEART CATHETERIZATION WITH [name];  Surgeon: Burnell Blanks, MD;  Location: [facility] CATH LAB;  Service: Cardiovascular;;  . Percutaneous coronary stent intervention (pci-s)  10/06/2012    Procedure: PERCUTANEOUS CORONARY STENT INTERVENTION (PCI-S);  Surgeon: Burnell Blanks, MD;  Location: [facility] CATH LAB;  Service: Cardiovascular;;  . Left heart catheterization with coronary/graft angiogram N/A 11/09/2012    Procedure: LEFT HEART CATHETERIZATION WITH [name];  Surgeon: Peter M Martinique, MD;  Location: [facility] CATH LAB;  Service: Cardiovascular;  Laterality: N/A;  . Left heart catheterization with coronary/graft angiogram N/A 05/19/2014    Procedure: LEFT HEART CATHETERIZATION WITH [name];  Surgeon: Troy Sine, MD;  Location: [facility] CATH LAB;  Service: Cardiovascular;  Laterality: N/A;   Family History  Problem Relation Age of Onset  . Early death      Parents died young  . Appendicitis Mother     Pt was 78 year old  . Heart attack Father 18   Social History  Substance Use Topics  . Smoking status: Former Smoker -- 2.00 packs/day for 10 years    Types: Cigarettes    Start date: 07/14/1950    Quit date: 07/14/1961  . Smokeless tobacco: Current User    Types: Chew  . Alcohol Use: No  lives at home  Lives alone  Review of Systems  Constitutional: Negative for fever, chills and diaphoresis.  Respiratory: Positive for shortness of breath.   Cardiovascular: Positive for chest pain.  Gastrointestinal: Negative for nausea, vomiting and abdominal pain.  Genitourinary: Negative for dysuria.  Musculoskeletal: Negative for back pain.  Skin: Negative for rash.  Neurological: Positive for dizziness. Negative for headaches.  Psychiatric/Behavioral: Negative for confusion.  All other systems reviewed and are  negative.     Allergies  Review of patient's allergies indicates no known allergies.  Home Medications   Prior to Admission medications   Medication Sig Start Date End Date Taking? Authorizing Provider  acetaminophen (TYLENOL) 325 MG tablet Take 2 tablets (650 mg total) by mouth every 4 (four) hours as needed for headache or mild pain. Patient taking differently: Take 325 mg by mouth 2 (two) times daily as needed for headache or mild pain.  05/21/14  Yes Luke K Kilroy, PA-C  albuterol (PROVENTIL HFA;VENTOLIN HFA) 108 (90 BASE) MCG/ACT inhaler Inhale 2 puffs into the lungs every 6 (six) hours as needed for wheezing or shortness of breath.   Yes Historical Provider, MD  aspirin EC 81 MG tablet Take 81 mg by mouth daily.   Yes Historical Provider, MD  atorvastatin (LIPITOR) 40 MG tablet Take 1 tablet (40 mg total) by mouth daily at 6 PM. 05/29/13  Yes Scott T Kathlen Mody, PA-C  bismuth subsalicylate (PEPTO BISMOL) 262 MG/15ML suspension Take 30 mLs by mouth every 6 (six) hours as needed for indigestion or diarrhea or loose stools.    Yes Historical Provider, MD  BRILINTA 90 MG TABS tablet  TAKE 1 TABLET BY MOUTH TWICE DAILY. 01/16/15  Yes Herminio Commons, MD  carvedilol (COREG) 3.125 MG tablet TAKE 1 TABLET BY MOUTH TWICE DAILY WITH MEALS. (HEART RATE & BLOOD PRESSURE) 03/05/15  Yes Lendon Colonel, NP  famotidine (PEPCID) 20 MG tablet Take 1 tablet (20 mg total) by mouth at bedtime. 03/03/15  Yes Rexene Alberts, MD  furosemide (LASIX) 20 MG tablet Take 20 mg by mouth as needed for fluid. Take 1 tablet as needed if 3 lb weight gain. 05/06/13  Yes Lendon Colonel, NP  isosorbide mononitrate (IMDUR) 30 MG 24 hr tablet Take 1.5 tablets (45 mg total) by mouth daily. 03/03/15  Yes Rexene Alberts, MD  levothyroxine (SYNTHROID, LEVOTHROID) 50 MCG tablet Take 50 mcg by mouth daily before breakfast.  09/08/14  Yes Historical Provider, MD  loperamide (IMODIUM) 2 MG capsule Take 1 capsule (2 mg total) by mouth  4 (four) times daily as needed for diarrhea or loose stools. 04/01/15  Yes Kristen N Ward, DO  metFORMIN (GLUCOPHAGE) 500 MG tablet Take 2 tablets (1,000 mg total) by mouth 2 (two) times daily with a meal. RESTART ON Sunday, 03/04/15. Patient taking differently: Take 1,000 mg by mouth 2 (two) times daily with a meal.  03/03/15  Yes Rexene Alberts, MD  Multiple Vitamin (MULTIVITAMIN WITH MINERALS) TABS Take 1 tablet by mouth daily.   Yes Historical Provider, MD  nitroGLYCERIN (NITROSTAT) 0.4 MG SL tablet Place 0.4 mg under the tongue every 5 (five) minutes as needed for chest pain.   Yes Historical Provider, MD  potassium chloride (K-DUR) 10 MEQ tablet Take 10 mEq by mouth as needed. Takes along with the Furosemide when he has swelling.   Yes Historical Provider, MD  tamsulosin (FLOMAX) 0.4 MG CAPS Take 0.4 mg by mouth daily after supper.  01/27/13  Yes Historical Provider, MD  diphenoxylate-atropine (LOMOTIL) 2.5-0.025 MG per tablet Take 1 tablet by mouth 4 (four) times daily as needed for diarrhea or loose stools. Patient not taking: Reported on 04/02/2015 01/07/15   Tanna Furry, MD  glimepiride (AMARYL) 2 MG tablet FOR YOUR DIABETES WHILE OFF OF METFORMIN, TAKE 1 TABLET TODAY AND TOMORROW. Patient not taking: Reported on 04/02/2015 03/03/15   Rexene Alberts, MD  oxyCODONE-acetaminophen (PERCOCET) 5-325 MG per tablet Take 1 tablet by mouth every 6 (six) hours as needed for moderate pain. Patient not taking: Reported on 04/16/2015 03/03/15   Rexene Alberts, MD   Triage Vitals: BP 164/88 mmHg  Pulse 74  Temp(Src) 97.3 F (36.3 C) (Oral)  Resp 16  Ht 5\' 10"  (1.778 m)  Wt 170 lb (77.111 kg)  BMI 24.39 kg/m2  SpO2 100%  Vital signs normal except for hypertension    Physical Exam  Constitutional: He is oriented to person, place, and time. He appears well-developed and well-nourished.  Non-toxic appearance. He does not appear ill. No distress.  HENT:  Head: Normocephalic and atraumatic.  Right Ear:  External ear normal.  Left Ear: External ear normal.  Nose: Nose normal. No mucosal edema or rhinorrhea.  Mouth/Throat: Oropharynx is clear and moist and mucous membranes are normal. No dental abscesses or uvula swelling.  Eyes: Conjunctivae and EOM are normal. Pupils are equal, round, and reactive to light.  Neck: Normal range of motion and full passive range of motion without pain. Neck supple.  Cardiovascular: Normal rate, regular rhythm and normal heart sounds.  Exam reveals no gallop and no friction rub.   No murmur heard. Pulmonary/Chest: Effort normal and breath  sounds normal. No respiratory distress. He has no wheezes. He has no rhonchi. He has no rales. He exhibits no tenderness and no crepitus.  Abdominal: Soft. Normal appearance and bowel sounds are normal. He exhibits no distension. There is no tenderness. There is no rebound and no guarding.  Musculoskeletal: Normal range of motion. He exhibits no edema or tenderness.  Moves all extremities well.   Neurological: He is alert and oriented to person, place, and time. He has normal strength. No cranial nerve deficit.  Skin: Skin is warm, dry and intact. No rash noted. No erythema. No pallor.  Psychiatric: He has a normal mood and affect. His speech is normal and behavior is normal. His mood appears not anxious.  Nursing note and vitals reviewed.   ED Course  Procedures (including critical care time)  DIAGNOSTIC STUDIES: Oxygen Saturation is 100% on RA, Normal by my interpretation.    COORDINATION OF CARE: 11:26 PM- Will order CBC troponin I, BMP, EKG, and urinalysis. Discussed treatment plan with pt at bedside and pt agreed to plan.     11:41 PM- Pt is ambulating to the bathroom without difficulty.  At time of discharge patient's blood pressure was 146/80. He states he feels fine. He was given his test results. We discussed his fear or anxiety when he feels like he is getting sick. We discussed him possibly getting a friend he  could talk to when he feels that way. Patient is a frequent ED visitor and has been a frequent ED visitor for years.  Labs Review Results for orders placed or performed during the hospital encounter of 04/16/15  CBC with Differential  Result Value Ref Range   WBC 6.9 4.0 - 10.5 K/uL   RBC 4.13 (L) 4.22 - 5.81 MIL/uL   Hemoglobin 13.3 13.0 - 17.0 g/dL   HCT 39.3 39.0 - 52.0 %   MCV 95.2 78.0 - 100.0 fL   MCH 32.2 26.0 - 34.0 pg   MCHC 33.8 30.0 - 36.0 g/dL   RDW 13.0 11.5 - 15.5 %   Platelets 150 150 - 400 K/uL   Neutrophils Relative % 61 %   Neutro Abs 4.2 1.7 - 7.7 K/uL   Lymphocytes Relative 24 %   Lymphs Abs 1.7 0.7 - 4.0 K/uL   Monocytes Relative 11 %   Monocytes Absolute 0.7 0.1 - 1.0 K/uL   Eosinophils Relative 4 %   Eosinophils Absolute 0.3 0.0 - 0.7 K/uL   Basophils Relative 0 %   Basophils Absolute 0.0 0.0 - 0.1 K/uL  Troponin I  Result Value Ref Range   Troponin I <0.03 <0.031 ng/mL  Basic metabolic panel  Result Value Ref Range   Sodium 135 135 - 145 mmol/L   Potassium 4.3 3.5 - 5.1 mmol/L   Chloride 104 101 - 111 mmol/L   CO2 25 22 - 32 mmol/L   Glucose, Bld 179 (H) 65 - 99 mg/dL   BUN 16 6 - 20 mg/dL   Creatinine, Ser 0.75 0.61 - 1.24 mg/dL   Calcium 9.1 8.9 - 10.3 mg/dL   GFR calc non Af Amer >60 >60 mL/min   GFR calc Af Amer >60 >60 mL/min   Anion gap 6 5 - 15  Urinalysis, Routine w reflex microscopic  Result Value Ref Range   Color, Urine STRAW (A) YELLOW   APPearance CLEAR CLEAR   Specific Gravity, Urine 1.010 1.005 - 1.030   pH 7.5 5.0 - 8.0   Glucose, UA NEGATIVE NEGATIVE mg/dL  Hgb urine dipstick NEGATIVE NEGATIVE   Bilirubin Urine NEGATIVE NEGATIVE   Ketones, ur NEGATIVE NEGATIVE mg/dL   Protein, ur NEGATIVE NEGATIVE mg/dL   Urobilinogen, UA 0.2 0.0 - 1.0 mg/dL   Nitrite NEGATIVE NEGATIVE   Leukocytes, UA NEGATIVE NEGATIVE       Imaging Review No results found. I have personally reviewed and evaluated these images and lab results as  part of my medical decision-making.   EKG Interpretation   Date/Time:  Monday April 16 2015 22:39:57 EDT Ventricular Rate:  76 PR Interval:  193 QRS Duration: 109 QT Interval:  416 QTC Calculation: 468 R Axis:   95 Text Interpretation:  Sinus rhythm Multiple ventricular premature  complexes Right axis deviation Nonspecific repol abnormality, diffuse  leads PVC new Otherwise no significant change Confirmed by ZACKOWSKI  MD,  SCOTT [phone]) on 04/16/2015 10:43:28 PM      MDM   patient is a frequent ED visitor and mostly his visits seem to be anxiety provoked. He does have underlying heart disease and has 10 stents per patient.    Final diagnoses:  [place] discharge  Rolland Porter, MD, Barbette Or, MD 04/17/15 [phone]

## 2015-04-17 DIAGNOSIS — R42 Dizziness and giddiness: Secondary | ICD-10-CM | POA: Diagnosis not present

## 2015-04-17 LAB — BASIC METABOLIC PANEL
Anion gap: 6 (ref 5–15)
BUN: 16 mg/dL (ref 6–20)
CO2: 25 mmol/L (ref 22–32)
CREATININE: 0.75 mg/dL (ref 0.61–1.24)
Calcium: 9.1 mg/dL (ref 8.9–10.3)
Chloride: 104 mmol/L (ref 101–111)
Glucose, Bld: 179 mg/dL — ABNORMAL HIGH (ref 65–99)
Potassium: 4.3 mmol/L (ref 3.5–5.1)
SODIUM: 135 mmol/L (ref 135–145)

## 2015-04-17 LAB — URINALYSIS, ROUTINE W REFLEX MICROSCOPIC
BILIRUBIN URINE: NEGATIVE
GLUCOSE, UA: NEGATIVE mg/dL
HGB URINE DIPSTICK: NEGATIVE
KETONES UR: NEGATIVE mg/dL
Leukocytes, UA: NEGATIVE
Nitrite: NEGATIVE
PROTEIN: NEGATIVE mg/dL
Specific Gravity, Urine: 1.01 (ref 1.005–1.030)
UROBILINOGEN UA: 0.2 mg/dL (ref 0.0–1.0)
pH: 7.5 (ref 5.0–8.0)

## 2015-04-17 LAB — TROPONIN I

## 2015-04-17 NOTE — ED Notes (Signed)
Pt states understanding and care given.

## 2015-04-17 NOTE — Discharge Instructions (Signed)
Recheck as needed °

## 2015-04-24 ENCOUNTER — Emergency Department (HOSPITAL_COMMUNITY): Payer: Medicare Other

## 2015-04-24 ENCOUNTER — Emergency Department (HOSPITAL_COMMUNITY)
Admission: EM | Admit: 2015-04-24 | Discharge: 2015-04-24 | Disposition: A | Discharge status: Home / Self Care | Payer: Medicare Other | Attending: Emergency Medicine | Admitting: Emergency Medicine

## 2015-04-24 ENCOUNTER — Encounter (HOSPITAL_COMMUNITY): Payer: Self-pay | Admitting: *Deleted

## 2015-04-24 DIAGNOSIS — G8929 Other chronic pain: Secondary | ICD-10-CM | POA: Insufficient documentation

## 2015-04-24 DIAGNOSIS — M79671 Pain in right foot: Secondary | ICD-10-CM | POA: Diagnosis not present

## 2015-04-24 DIAGNOSIS — Z8701 Personal history of pneumonia (recurrent): Secondary | ICD-10-CM | POA: Diagnosis not present

## 2015-04-24 DIAGNOSIS — Z8781 Personal history of (healed) traumatic fracture: Secondary | ICD-10-CM | POA: Diagnosis not present

## 2015-04-24 DIAGNOSIS — R0789 Other chest pain: Secondary | ICD-10-CM | POA: Diagnosis not present

## 2015-04-24 DIAGNOSIS — E119 Type 2 diabetes mellitus without complications: Secondary | ICD-10-CM | POA: Insufficient documentation

## 2015-04-24 DIAGNOSIS — E039 Hypothyroidism, unspecified: Secondary | ICD-10-CM | POA: Insufficient documentation

## 2015-04-24 DIAGNOSIS — Z951 Presence of aortocoronary bypass graft: Secondary | ICD-10-CM | POA: Diagnosis not present

## 2015-04-24 DIAGNOSIS — I10 Essential (primary) hypertension: Secondary | ICD-10-CM | POA: Insufficient documentation

## 2015-04-24 DIAGNOSIS — N400 Enlarged prostate without lower urinary tract symptoms: Secondary | ICD-10-CM | POA: Insufficient documentation

## 2015-04-24 DIAGNOSIS — Z79899 Other long term (current) drug therapy: Secondary | ICD-10-CM | POA: Insufficient documentation

## 2015-04-24 DIAGNOSIS — Z8673 Personal history of transient ischemic attack (TIA), and cerebral infarction without residual deficits: Secondary | ICD-10-CM | POA: Diagnosis not present

## 2015-04-24 DIAGNOSIS — E785 Hyperlipidemia, unspecified: Secondary | ICD-10-CM | POA: Diagnosis not present

## 2015-04-24 DIAGNOSIS — Z8711 Personal history of peptic ulcer disease: Secondary | ICD-10-CM | POA: Insufficient documentation

## 2015-04-24 DIAGNOSIS — I2510 Atherosclerotic heart disease of native coronary artery without angina pectoris: Secondary | ICD-10-CM | POA: Diagnosis not present

## 2015-04-24 DIAGNOSIS — Z9889 Other specified postprocedural states: Secondary | ICD-10-CM | POA: Insufficient documentation

## 2015-04-24 DIAGNOSIS — M1990 Unspecified osteoarthritis, unspecified site: Secondary | ICD-10-CM | POA: Diagnosis not present

## 2015-04-24 DIAGNOSIS — Z7982 Long term (current) use of aspirin: Secondary | ICD-10-CM | POA: Diagnosis not present

## 2015-04-24 DIAGNOSIS — Z87891 Personal history of nicotine dependence: Secondary | ICD-10-CM | POA: Diagnosis not present

## 2015-04-24 DIAGNOSIS — K219 Gastro-esophageal reflux disease without esophagitis: Secondary | ICD-10-CM | POA: Insufficient documentation

## 2015-04-24 DIAGNOSIS — I252 Old myocardial infarction: Secondary | ICD-10-CM | POA: Diagnosis not present

## 2015-04-24 LAB — CBC WITH DIFFERENTIAL/PLATELET
BASOS ABS: 0 10*3/uL (ref 0.0–0.1)
BASOS PCT: 0 %
EOS ABS: 0.2 10*3/uL (ref 0.0–0.7)
EOS PCT: 4 %
HEMATOCRIT: 38.8 % — AB (ref 39.0–52.0)
Hemoglobin: 13.2 g/dL (ref 13.0–17.0)
Lymphocytes Relative: 24 %
Lymphs Abs: 1.4 10*3/uL (ref 0.7–4.0)
MCH: 32 pg (ref 26.0–34.0)
MCHC: 34 g/dL (ref 30.0–36.0)
MCV: 94.2 fL (ref 78.0–100.0)
MONO ABS: 0.5 10*3/uL (ref 0.1–1.0)
MONOS PCT: 9 %
NEUTROS ABS: 3.8 10*3/uL (ref 1.7–7.7)
Neutrophils Relative %: 63 %
PLATELETS: 133 10*3/uL — AB (ref 150–400)
RBC: 4.12 MIL/uL — ABNORMAL LOW (ref 4.22–5.81)
RDW: 12.8 % (ref 11.5–15.5)
WBC: 6 10*3/uL (ref 4.0–10.5)

## 2015-04-24 LAB — BASIC METABOLIC PANEL
ANION GAP: 9 (ref 5–15)
BUN: 13 mg/dL (ref 6–20)
CALCIUM: 8.4 mg/dL — AB (ref 8.9–10.3)
CO2: 23 mmol/L (ref 22–32)
CREATININE: 0.82 mg/dL (ref 0.61–1.24)
Chloride: 101 mmol/L (ref 101–111)
Glucose, Bld: 229 mg/dL — ABNORMAL HIGH (ref 65–99)
Potassium: 3.5 mmol/L (ref 3.5–5.1)
SODIUM: 133 mmol/L — AB (ref 135–145)

## 2015-04-24 LAB — TROPONIN I: Troponin I: <0.03 ng/mL (ref <0.031)

## 2015-04-24 NOTE — ED Provider Notes (Signed)
CSN: [id number]     Arrival date & time 04/24/15  0047 History   First MD Initiated Contact with Patient 04/24/15 0148     Chief Complaint  Patient presents with  . Foot Pain     (Consider location/radiation/quality/duration/timing/severity/associated sxs/prior Treatment) Patient is a 81 y.o. male presenting with lower extremity pain. The history is provided by the patient.  Foot Pain  He states that he was having episodes of pain in his right foot when he went to bed at 9 PM. Pain would be her in the lateral aspect of the midfoot and then radiated to the dorsum of the foot. Pain would be present for a short period of time before going away and then coming back. This continued for several hours. However, he chose to come to the ED when he had an episode of chest tightness like indigestion. This lasted about 20 minutes before resolving. There is no associated dyspnea, nausea, diaphoresis. He does have a history of coronary artery disease and has had bypass surgery as well as numerous stents and states that the pain that he had a similar to what he has had in the past with heart related pain. He did not use any nitroglycerin, but he did take aspirin. Her 25 mg. Nothing made the pain better nothing made it worse.  Past Medical History  Diagnosis Date  . Diabetes mellitus, type II ([place])   . Hyperlipidemia   . Coronary atherosclerosis of native coronary artery     a. CABG x 4 in 1989 (VG->OM1->OM2, VG->RCA, LIMA->LAD), b. 05/2010: DES to VG-OM1/OM2, DES to distal LCx. c. NSTEMI in 04/2011 - TO distal LCX stent and VG->OM2. d. 02/2012 NSTEMI DES to VG-OM1/continuation to OM2 occluded. e. inferior STEMI s/p DES to SVG-RAMUS 06/2012. f. inferolat STEMI 09/2012 s/p DES to SVG-interm; g. Lex MV (11/14):  EF 35%, inf-lat scar with small peri-infarct ischemia  . Essential hypertension, benign   . Osteoarthritis   . History of stroke   . History of pneumonia   . Cervical vertebral fracture ([place])   .  Chronic back pain   . Benign prostatic hypertrophy     History of urinary retention  . Peptic ulcer disease   . Gastroesophageal reflux disease   . Ischemic cardiomyopathy Nov 2015    EF 35% cath, 45-50% by echo  . Hypothyroidism    Past Surgical History  Procedure Laterality Date  . Tonsillectomy    . Coronary artery bypass graft  1989  . Coronary angioplasty  10/12, 8/13, 12/13, 3/14    SVG-OM PCI  . Cardiac catheterization  05/19/14    SVG-OM occl- medical Rx  . Left heart catheterization with coronary angiogram N/A 11/01/2011    Procedure: LEFT HEART CATHETERIZATION WITH CORONARY ANGIOGRAM;  Surgeon: Lorretta Harp, MD;  Location: [facility] CATH LAB;  Service: Cardiovascular;  Laterality: N/A;  . Percutaneous coronary stent intervention (pci-s) N/A 11/01/2011    Procedure: PERCUTANEOUS CORONARY STENT INTERVENTION (PCI-S);  Surgeon: Lorretta Harp, MD;  Location: [facility] CATH LAB;  Service: Cardiovascular;  Laterality: N/A;  . Left heart catheterization with coronary/graft angiogram N/A 03/10/2012    Procedure: LEFT HEART CATHETERIZATION WITH [name];  Surgeon: Sherren Mocha, MD;  Location: [facility] CATH LAB;  Service: Cardiovascular;  Laterality: N/A;  . Left heart catheterization with coronary angiogram N/A 06/18/2012    Procedure: LEFT HEART CATHETERIZATION WITH CORONARY ANGIOGRAM;  Surgeon: Peter M Martinique, MD;  Location: [facility] CATH LAB;  Service: Cardiovascular;  Laterality: N/A;  .  Percutaneous coronary stent intervention (pci-s)  06/18/2012    Procedure: PERCUTANEOUS CORONARY STENT INTERVENTION (PCI-S);  Surgeon: Peter M Martinique, MD;  Location: [facility] CATH LAB;  Service: Cardiovascular;;  . Left heart catheterization with coronary/graft angiogram  10/06/2012    Procedure: LEFT HEART CATHETERIZATION WITH [name];  Surgeon: Burnell Blanks, MD;  Location: [facility] CATH LAB;  Service: Cardiovascular;;  . Percutaneous coronary stent intervention (pci-s)  10/06/2012     Procedure: PERCUTANEOUS CORONARY STENT INTERVENTION (PCI-S);  Surgeon: Burnell Blanks, MD;  Location: [facility] CATH LAB;  Service: Cardiovascular;;  . Left heart catheterization with coronary/graft angiogram N/A 11/09/2012    Procedure: LEFT HEART CATHETERIZATION WITH [name];  Surgeon: Peter M Martinique, MD;  Location: [facility] CATH LAB;  Service: Cardiovascular;  Laterality: N/A;  . Left heart catheterization with coronary/graft angiogram N/A 05/19/2014    Procedure: LEFT HEART CATHETERIZATION WITH [name];  Surgeon: Troy Sine, MD;  Location: [facility] CATH LAB;  Service: Cardiovascular;  Laterality: N/A;   Family History  Problem Relation Age of Onset  . Early death      Parents died young  . Appendicitis Mother     Pt was 61 year old  . Heart attack Father 17   Social History  Substance Use Topics  . Smoking status: Former Smoker -- 2.00 packs/day for 10 years    Types: Cigarettes    Start date: 07/14/1950    Quit date: 07/14/1961  . Smokeless tobacco: Current User    Types: Chew  . Alcohol Use: No    Review of Systems  All other systems reviewed and are negative.     Allergies  Review of patient's allergies indicates no known allergies.  Home Medications   Prior to Admission medications   Medication Sig Start Date End Date Taking? Authorizing Provider  acetaminophen (TYLENOL) 325 MG tablet Take 2 tablets (650 mg total) by mouth every 4 (four) hours as needed for headache or mild pain. Patient taking differently: Take 325 mg by mouth 2 (two) times daily as needed for headache or mild pain.  05/21/14   Erlene Quan, PA-C  albuterol (PROVENTIL HFA;VENTOLIN HFA) 108 (90 BASE) MCG/ACT inhaler Inhale 2 puffs into the lungs every 6 (six) hours as needed for wheezing or shortness of breath.    Historical Provider, MD  aspirin EC 81 MG tablet Take 81 mg by mouth daily.    Historical Provider, MD  atorvastatin (LIPITOR) 40 MG tablet Take 1 tablet (40 mg  total) by mouth daily at 6 PM. 05/29/13   Liliane Shi, PA-C  bismuth subsalicylate (PEPTO BISMOL) 262 MG/15ML suspension Take 30 mLs by mouth every 6 (six) hours as needed for indigestion or diarrhea or loose stools.     Historical Provider, MD  BRILINTA 90 MG TABS tablet TAKE 1 TABLET BY MOUTH TWICE DAILY. 01/16/15   Herminio Commons, MD  carvedilol (COREG) 3.125 MG tablet TAKE 1 TABLET BY MOUTH TWICE DAILY WITH MEALS. (HEART RATE & BLOOD PRESSURE) 03/05/15   Lendon Colonel, NP  diphenoxylate-atropine (LOMOTIL) 2.5-0.025 MG per tablet Take 1 tablet by mouth 4 (four) times daily as needed for diarrhea or loose stools. Patient not taking: Reported on 04/02/2015 01/07/15   Tanna Furry, MD  famotidine (PEPCID) 20 MG tablet Take 1 tablet (20 mg total) by mouth at bedtime. 03/03/15   Rexene Alberts, MD  furosemide (LASIX) 20 MG tablet Take 20 mg by mouth as needed for fluid. Take 1 tablet as needed  if 3 lb weight gain. 05/06/13   Lendon Colonel, NP  glimepiride (AMARYL) 2 MG tablet FOR YOUR DIABETES WHILE OFF OF METFORMIN, TAKE 1 TABLET TODAY AND TOMORROW. Patient not taking: Reported on 04/02/2015 03/03/15   Rexene Alberts, MD  isosorbide mononitrate (IMDUR) 30 MG 24 hr tablet Take 1.5 tablets (45 mg total) by mouth daily. 03/03/15   Rexene Alberts, MD  levothyroxine (SYNTHROID, LEVOTHROID) 50 MCG tablet Take 50 mcg by mouth daily before breakfast.  09/08/14   Historical Provider, MD  loperamide (IMODIUM) 2 MG capsule Take 1 capsule (2 mg total) by mouth 4 (four) times daily as needed for diarrhea or loose stools. 04/01/15   Kristen N Ward, DO  metFORMIN (GLUCOPHAGE) 500 MG tablet Take 2 tablets (1,000 mg total) by mouth 2 (two) times daily with a meal. RESTART ON Sunday, 03/04/15. Patient taking differently: Take 1,000 mg by mouth 2 (two) times daily with a meal.  03/03/15   Rexene Alberts, MD  Multiple Vitamin (MULTIVITAMIN WITH MINERALS) TABS Take 1 tablet by mouth daily.    Historical Provider, MD   nitroGLYCERIN (NITROSTAT) 0.4 MG SL tablet Place 0.4 mg under the tongue every 5 (five) minutes as needed for chest pain.    Historical Provider, MD  oxyCODONE-acetaminophen (PERCOCET) 5-325 MG per tablet Take 1 tablet by mouth every 6 (six) hours as needed for moderate pain. Patient not taking: Reported on 04/16/2015 03/03/15   Rexene Alberts, MD  potassium chloride (K-DUR) 10 MEQ tablet Take 10 mEq by mouth as needed. Takes along with the Furosemide when he has swelling.    Historical Provider, MD  tamsulosin (FLOMAX) 0.4 MG CAPS Take 0.4 mg by mouth daily after supper.  01/27/13   Historical Provider, MD   BP 149/74 mmHg  Pulse 78  Temp(Src) 98.1 F (36.7 C) (Oral)  Resp 18  Ht 5\' 9"  (1.753 m)  Wt 170 lb (77.111 kg)  BMI 25.09 kg/m2  SpO2 100% Physical Exam  Nursing note and vitals reviewed.  81 year old male, resting comfortably and in no acute distress. Vital signs are significant for hypertension. Oxygen saturation is 100%, which is normal. Head is normocephalic and atraumatic. PERRLA, EOMI. Oropharynx is clear. Neck is nontender and supple without adenopathy or JVD. Back is nontender and there is no CVA tenderness. Lungs are clear without rales, wheezes, or rhonchi. Chest is nontender. Heart has regular rate and rhythm without murmur. Abdomen is soft, flat, nontender without masses or hepatosplenomegaly and peristalsis is normoactive. Extremities have no cyanosis or edema, full range of motion is present. Dorsalis pedis pulses are present bilaterally. Feet are warm and there is no swelling or tenderness. Capillary refill is prompt. Caps circumference is symmetric. Skin is warm and dry without rash. Neurologic: Mental status is normal, cranial nerves are intact, there are no motor or sensory deficits.   ED Course  Procedures (including critical care time) Labs Review Results for orders placed or performed during the hospital encounter of 04/24/15  CBC with Differential  Result  Value Ref Range   WBC 6.0 4.0 - 10.5 K/uL   RBC 4.12 (L) 4.22 - 5.81 MIL/uL   Hemoglobin 13.2 13.0 - 17.0 g/dL   HCT 38.8 (L) 39.0 - 52.0 %   MCV 94.2 78.0 - 100.0 fL   MCH 32.0 26.0 - 34.0 pg   MCHC 34.0 30.0 - 36.0 g/dL   RDW 12.8 11.5 - 15.5 %   Platelets 133 (L) 150 - 400 K/uL   Neutrophils  Relative % 63 %   Neutro Abs 3.8 1.7 - 7.7 K/uL   Lymphocytes Relative 24 %   Lymphs Abs 1.4 0.7 - 4.0 K/uL   Monocytes Relative 9 %   Monocytes Absolute 0.5 0.1 - 1.0 K/uL   Eosinophils Relative 4 %   Eosinophils Absolute 0.2 0.0 - 0.7 K/uL   Basophils Relative 0 %   Basophils Absolute 0.0 0.0 - 0.1 K/uL  Basic metabolic panel  Result Value Ref Range   Sodium 133 (L) 135 - 145 mmol/L   Potassium 3.5 3.5 - 5.1 mmol/L   Chloride 101 101 - 111 mmol/L   CO2 23 22 - 32 mmol/L   Glucose, Bld 229 (H) 65 - 99 mg/dL   BUN 13 6 - 20 mg/dL   Creatinine, Ser 0.82 0.61 - 1.24 mg/dL   Calcium 8.4 (L) 8.9 - 10.3 mg/dL   GFR calc non Af Amer >60 >60 mL/min   GFR calc Af Amer >60 >60 mL/min   Anion gap 9 5 - 15  Troponin I  Result Value Ref Range   Troponin I <0.03 <0.031 ng/mL  Troponin I  Result Value Ref Range   Troponin I <0.03 <0.031 ng/mL   Imaging Review Dg Chest 2 View  04/24/2015   CLINICAL DATA:  81 year old male with moderate chest pain and right lower extremity pain. Initial encounter.  EXAM: CHEST  2 VIEW  COMPARISON:  03/30/2015 and earlier.  FINDINGS: Stable cardiac size and mediastinal contours. Sequelae of CABG. Stable lung volumes. No pneumothorax, pulmonary edema, pleural effusion or acute pulmonary opacity. Osteopenia. Visualized tracheal air column is within normal limits. Chronic calcified atherosclerosis of the aorta.  IMPRESSION: No acute cardiopulmonary abnormality.   Electronically Signed   By: Genevie Ann M.D.   On: 04/24/2015 02:25   I have personally reviewed and evaluated these images and lab results as part of my medical decision-making.   EKG  Interpretation   Date/Time:  Tuesday April 24 2015 02:07:07 EDT Ventricular Rate:  68 PR Interval:  191 QRS Duration: 116 QT Interval:  479 QTC Calculation: 509 R Axis:   86 Text Interpretation:  Sinus rhythm Incomplete right bundle branch block  Nonspecific repol abnormality, lateral leads When compared with ECG of  04/16/2015, Premature ventricular complexes are no longer Present Confirmed  by [facility]  MD, Ajahni Nay ([id number]) on 04/24/2015 2:15:37 AM      MDM   Final diagnoses:  Chest discomfort  Pain in right foot    Foot pain without obvious source. Chest discomfort worrisome for coronary artery disease. Old records are reviewed and he has been followed by cardiology having had coronary artery bypass surgery and multiple stents placed. Given this history, ECG will be checked as well as troponin and he will be kept in the ED for a delta troponin.  Repeat troponin is also negative. He is discharged with instructions to follow-up with his cardiologist and also with his PCP.  Delora Fuel, MD 37/16/96 [id number]

## 2015-04-24 NOTE — ED Notes (Signed)
Patient given discharge instruction, verbalized understand. IV removed, band aid applied. Patient ambulatory out of the department.  

## 2015-04-24 NOTE — ED Notes (Signed)
Pt reporting pain in bottom and side of right foot.  States that earlier tonight he felt some pain in his chest, but that pain eased when he got to department.

## 2015-04-24 NOTE — Discharge Instructions (Signed)
Nonspecific Chest Pain  °Chest pain can be caused by many different conditions. There is always a chance that your pain could be related to something serious, such as a heart attack or a blood clot in your lungs. Chest pain can also be caused by conditions that are not life-threatening. If you have chest pain, it is very important to follow up with your health care provider. °CAUSES  °Chest pain can be caused by: °· Heartburn. °· Pneumonia or bronchitis. °· Anxiety or stress. °· Inflammation around your heart (pericarditis) or lung (pleuritis or pleurisy). °· A blood clot in your lung. °· A collapsed lung (pneumothorax). It can develop suddenly on its own (spontaneous pneumothorax) or from trauma to the chest. °· Shingles infection (varicella-zoster virus). °· Heart attack. °· Damage to the bones, muscles, and cartilage that make up your chest wall. This can include: °¨ Bruised bones due to injury. °¨ Strained muscles or cartilage due to frequent or repeated coughing or overwork. °¨ Fracture to one or more ribs. °¨ Sore cartilage due to inflammation (costochondritis). °RISK FACTORS  °Risk factors for chest pain may include: °· Activities that increase your risk for trauma or injury to your chest. °· Respiratory infections or conditions that cause frequent coughing. °· Medical conditions or overeating that can cause heartburn. °· Heart disease or family history of heart disease. °· Conditions or health behaviors that increase your risk of developing a blood clot. °· Having had chicken pox (varicella zoster). °SIGNS AND SYMPTOMS °Chest pain can feel like: °· Burning or tingling on the surface of your chest or deep in your chest. °· Crushing, pressure, aching, or squeezing pain. °· Dull or sharp pain that is worse when you move, cough, or take a deep breath. °· Pain that is also felt in your back, neck, shoulder, or arm, or pain that spreads to any of these areas. °Your chest pain may come and go, or it may stay  constant. °DIAGNOSIS °Lab tests or other studies may be needed to find the cause of your pain. Your health care provider may have you take a test called an ambulatory ECG (electrocardiogram). An ECG records your heartbeat patterns at the time the test is performed. You may also have other tests, such as: °· Transthoracic echocardiogram (TTE). During echocardiography, sound waves are used to create a picture of all of the heart structures and to look at how blood flows through your heart. °· Transesophageal echocardiogram (TEE). This is a more advanced imaging test that obtains images from inside your body. It allows your health care provider to see your heart in finer detail. °· Cardiac monitoring. This allows your health care provider to monitor your heart rate and rhythm in real time. °· Holter monitor. This is a portable device that records your heartbeat and can help to diagnose abnormal heartbeats. It allows your health care provider to track your heart activity for several days, if needed. °· Stress tests. These can be done through exercise or by taking medicine that makes your heart beat more quickly. °· Blood tests. °· Imaging tests. °TREATMENT  °Your treatment depends on what is causing your chest pain. Treatment may include: °· Medicines. These may include: °¨ Acid blockers for heartburn. °¨ Anti-inflammatory medicine. °¨ Pain medicine for inflammatory conditions. °¨ Antibiotic medicine, if an infection is present. °¨ Medicines to dissolve blood clots. °¨ Medicines to treat coronary artery disease. °· Supportive care for conditions that do not require medicines. This may include: °¨ Resting. °¨ Applying heat   or cold packs to injured areas. °¨ Limiting activities until pain decreases. °HOME CARE INSTRUCTIONS °· If you were prescribed an antibiotic medicine, finish it all even if you start to feel better. °· Avoid any activities that bring on chest pain. °· Do not use any tobacco products, including  cigarettes, chewing tobacco, or electronic cigarettes. If you need help quitting, ask your health care provider. °· Do not drink alcohol. °· Take medicines only as directed by your health care provider. °· Keep all follow-up visits as directed by your health care provider. This is important. This includes any further testing if your chest pain does not go away. °· If heartburn is the cause for your chest pain, you may be told to keep your head raised (elevated) while sleeping. This reduces the chance that acid will go from your stomach into your esophagus. °· Make lifestyle changes as directed by your health care provider. These may include: °¨ Getting regular exercise. Ask your health care provider to suggest some activities that are safe for you. °¨ Eating a heart-healthy diet. A registered dietitian can help you to learn healthy eating options. °¨ Maintaining a healthy weight. °¨ Managing diabetes, if necessary. °¨ Reducing stress. °SEEK MEDICAL CARE IF: °· Your chest pain does not go away after treatment. °· You have a rash with blisters on your chest. °· You have a fever. °SEEK IMMEDIATE MEDICAL CARE IF:  °· Your chest pain is worse. °· You have an increasing cough, or you cough up blood. °· You have severe abdominal pain. °· You have severe weakness. °· You faint. °· You have chills. °· You have sudden, unexplained chest discomfort. °· You have sudden, unexplained discomfort in your arms, back, neck, or jaw. °· You have shortness of breath at any time. °· You suddenly start to sweat, or your skin gets clammy. °· You feel nauseous or you vomit. °· You suddenly feel light-headed or dizzy. °· Your heart begins to beat quickly, or it feels like it is skipping beats. °These symptoms may represent a serious problem that is an emergency. Do not wait to see if the symptoms will go away. Get medical help right away. Call your local emergency services (911 in the [place] Do not drive yourself to the hospital. °  °This  information is not intended to replace advice given to you by your health care provider. Make sure you discuss any questions you have with your health care provider. °  °Document Released: 04/09/2005 Document Revised: 07/21/2014 Document Reviewed: 02/03/2014 °Elsevier Interactive Patient Education ©2016 [employer] ° °

## 2015-04-24 NOTE — ED Notes (Signed)
Pt states pain in bottom of foot, denies patient at present

## 2015-04-27 ENCOUNTER — Encounter (HOSPITAL_COMMUNITY): Payer: Self-pay

## 2015-04-27 ENCOUNTER — Emergency Department (HOSPITAL_COMMUNITY)
Admission: EM | Admit: 2015-04-27 | Discharge: 2015-04-27 | Disposition: A | Discharge status: Home / Self Care | Payer: Medicare Other | Attending: Emergency Medicine | Admitting: Emergency Medicine

## 2015-04-27 ENCOUNTER — Other Ambulatory Visit: Payer: Self-pay

## 2015-04-27 ENCOUNTER — Emergency Department (HOSPITAL_COMMUNITY): Payer: Medicare Other

## 2015-04-27 DIAGNOSIS — I10 Essential (primary) hypertension: Secondary | ICD-10-CM | POA: Insufficient documentation

## 2015-04-27 DIAGNOSIS — M1990 Unspecified osteoarthritis, unspecified site: Secondary | ICD-10-CM | POA: Diagnosis not present

## 2015-04-27 DIAGNOSIS — Z79899 Other long term (current) drug therapy: Secondary | ICD-10-CM | POA: Diagnosis not present

## 2015-04-27 DIAGNOSIS — R079 Chest pain, unspecified: Secondary | ICD-10-CM | POA: Diagnosis not present

## 2015-04-27 DIAGNOSIS — E785 Hyperlipidemia, unspecified: Secondary | ICD-10-CM | POA: Diagnosis not present

## 2015-04-27 DIAGNOSIS — Z9889 Other specified postprocedural states: Secondary | ICD-10-CM | POA: Insufficient documentation

## 2015-04-27 DIAGNOSIS — E039 Hypothyroidism, unspecified: Secondary | ICD-10-CM | POA: Diagnosis not present

## 2015-04-27 DIAGNOSIS — Z8781 Personal history of (healed) traumatic fracture: Secondary | ICD-10-CM | POA: Insufficient documentation

## 2015-04-27 DIAGNOSIS — Z87891 Personal history of nicotine dependence: Secondary | ICD-10-CM | POA: Insufficient documentation

## 2015-04-27 DIAGNOSIS — Z8673 Personal history of transient ischemic attack (TIA), and cerebral infarction without residual deficits: Secondary | ICD-10-CM | POA: Diagnosis not present

## 2015-04-27 DIAGNOSIS — Z9861 Coronary angioplasty status: Secondary | ICD-10-CM | POA: Insufficient documentation

## 2015-04-27 DIAGNOSIS — Z7982 Long term (current) use of aspirin: Secondary | ICD-10-CM | POA: Insufficient documentation

## 2015-04-27 DIAGNOSIS — Z8701 Personal history of pneumonia (recurrent): Secondary | ICD-10-CM | POA: Insufficient documentation

## 2015-04-27 DIAGNOSIS — E119 Type 2 diabetes mellitus without complications: Secondary | ICD-10-CM | POA: Diagnosis not present

## 2015-04-27 DIAGNOSIS — Z8719 Personal history of other diseases of the digestive system: Secondary | ICD-10-CM | POA: Insufficient documentation

## 2015-04-27 DIAGNOSIS — Z8711 Personal history of peptic ulcer disease: Secondary | ICD-10-CM | POA: Diagnosis not present

## 2015-04-27 DIAGNOSIS — I2510 Atherosclerotic heart disease of native coronary artery without angina pectoris: Secondary | ICD-10-CM | POA: Diagnosis not present

## 2015-04-27 DIAGNOSIS — N400 Enlarged prostate without lower urinary tract symptoms: Secondary | ICD-10-CM | POA: Diagnosis not present

## 2015-04-27 LAB — COMPREHENSIVE METABOLIC PANEL
ALBUMIN: 3.7 g/dL (ref 3.5–5.0)
ALK PHOS: 73 U/L (ref 38–126)
ALT: 24 U/L (ref 17–63)
AST: 30 U/L (ref 15–41)
Anion gap: 8 (ref 5–15)
BILIRUBIN TOTAL: 0.6 mg/dL (ref 0.3–1.2)
BUN: 11 mg/dL (ref 6–20)
CO2: 26 mmol/L (ref 22–32)
CREATININE: 0.79 mg/dL (ref 0.61–1.24)
Calcium: 9.3 mg/dL (ref 8.9–10.3)
Chloride: 105 mmol/L (ref 101–111)
GFR calc Af Amer: >60 mL/min (ref >60)
GFR calc non Af Amer: >60 mL/min (ref >60)
GLUCOSE: 195 mg/dL — AB (ref 65–99)
Potassium: 4 mmol/L (ref 3.5–5.1)
Sodium: 139 mmol/L (ref 135–145)
TOTAL PROTEIN: 6.2 g/dL — AB (ref 6.5–8.1)

## 2015-04-27 LAB — CBC
HEMATOCRIT: 38.2 % — AB (ref 39.0–52.0)
HEMOGLOBIN: 12.8 g/dL — AB (ref 13.0–17.0)
MCH: 31.9 pg (ref 26.0–34.0)
MCHC: 33.5 g/dL (ref 30.0–36.0)
MCV: 95.3 fL (ref 78.0–100.0)
Platelets: 151 10*3/uL (ref 150–400)
RBC: 4.01 MIL/uL — AB (ref 4.22–5.81)
RDW: 12.8 % (ref 11.5–15.5)
WBC: 6.9 10*3/uL (ref 4.0–10.5)

## 2015-04-27 LAB — TROPONIN I

## 2015-04-27 MED ORDER — ASPIRIN 81 MG PO CHEW
324.0000 mg | CHEWABLE_TABLET | Freq: Once | ORAL | Status: AC
  Administered 2015-04-27: 324 mg via ORAL
  Filled 2015-04-27: qty 4

## 2015-04-27 NOTE — ED Notes (Signed)
Pt states he has had several episodes of epigastric pain and pressure over the past several hours.  Pt denies pain at this time.

## 2015-04-27 NOTE — ED Notes (Signed)
Lab at bedside

## 2015-04-27 NOTE — ED Provider Notes (Signed)
CSN: [id number]     Arrival date & time 04/27/15  6073 History   First MD Initiated Contact with Patient 04/27/15 0536     Chief Complaint  Patient presents with  . Chest Pain     (Consider location/radiation/quality/duration/timing/severity/associated sxs/prior Treatment) HPI  81 year old male with a fleeting episode of retrosternal chest sharpness couple hours prior to arrival. Has a history of the same associated with his reflux however when he had a heart attack he felt like he had reflux type pain as well. No nausea, vomiting, diaphoresis, radiation, rash. No other exit better or worse. No repeat episodes since this one a couple hours ago. Took some type of generic antacid prior to my evaluation.  Past Medical History  Diagnosis Date  . Diabetes mellitus, type II ([place])   . Hyperlipidemia   . Coronary atherosclerosis of native coronary artery     a. CABG x 4 in 1989 (VG->OM1->OM2, VG->RCA, LIMA->LAD), b. 05/2010: DES to VG-OM1/OM2, DES to distal LCx. c. NSTEMI in 04/2011 - TO distal LCX stent and VG->OM2. d. 02/2012 NSTEMI DES to VG-OM1/continuation to OM2 occluded. e. inferior STEMI s/p DES to SVG-RAMUS 06/2012. f. inferolat STEMI 09/2012 s/p DES to SVG-interm; g. Lex MV (11/14):  EF 35%, inf-lat scar with small peri-infarct ischemia  . Essential hypertension, benign   . Osteoarthritis   . History of stroke   . History of pneumonia   . Cervical vertebral fracture ([place])   . Chronic back pain   . Benign prostatic hypertrophy     History of urinary retention  . Peptic ulcer disease   . Gastroesophageal reflux disease   . Ischemic cardiomyopathy Nov 2015    EF 35% cath, 45-50% by echo  . Hypothyroidism    Past Surgical History  Procedure Laterality Date  . Tonsillectomy    . Coronary artery bypass graft  1989  . Coronary angioplasty  10/12, 8/13, 12/13, 3/14    SVG-OM PCI  . Cardiac catheterization  05/19/14    SVG-OM occl- medical Rx  . Left heart catheterization with  coronary angiogram N/A 11/01/2011    Procedure: LEFT HEART CATHETERIZATION WITH CORONARY ANGIOGRAM;  Surgeon: Lorretta Harp, MD;  Location: [facility] CATH LAB;  Service: Cardiovascular;  Laterality: N/A;  . Percutaneous coronary stent intervention (pci-s) N/A 11/01/2011    Procedure: PERCUTANEOUS CORONARY STENT INTERVENTION (PCI-S);  Surgeon: Lorretta Harp, MD;  Location: [facility] CATH LAB;  Service: Cardiovascular;  Laterality: N/A;  . Left heart catheterization with coronary/graft angiogram N/A 03/10/2012    Procedure: LEFT HEART CATHETERIZATION WITH [name];  Surgeon: Sherren Mocha, MD;  Location: [facility] CATH LAB;  Service: Cardiovascular;  Laterality: N/A;  . Left heart catheterization with coronary angiogram N/A 06/18/2012    Procedure: LEFT HEART CATHETERIZATION WITH CORONARY ANGIOGRAM;  Surgeon: Peter M Martinique, MD;  Location: [facility] CATH LAB;  Service: Cardiovascular;  Laterality: N/A;  . Percutaneous coronary stent intervention (pci-s)  06/18/2012    Procedure: PERCUTANEOUS CORONARY STENT INTERVENTION (PCI-S);  Surgeon: Peter M Martinique, MD;  Location: [facility] CATH LAB;  Service: Cardiovascular;;  . Left heart catheterization with coronary/graft angiogram  10/06/2012    Procedure: LEFT HEART CATHETERIZATION WITH [name];  Surgeon: Burnell Blanks, MD;  Location: [facility] CATH LAB;  Service: Cardiovascular;;  . Percutaneous coronary stent intervention (pci-s)  10/06/2012    Procedure: PERCUTANEOUS CORONARY STENT INTERVENTION (PCI-S);  Surgeon: Burnell Blanks, MD;  Location: [facility] CATH LAB;  Service: Cardiovascular;;  . Left heart catheterization with coronary/graft angiogram  N/A 11/09/2012    Procedure: LEFT HEART CATHETERIZATION WITH [name];  Surgeon: Peter M Martinique, MD;  Location: [facility] CATH LAB;  Service: Cardiovascular;  Laterality: N/A;  . Left heart catheterization with coronary/graft angiogram N/A 05/19/2014    Procedure: LEFT HEART CATHETERIZATION WITH  [name];  Surgeon: Troy Sine, MD;  Location: [facility] CATH LAB;  Service: Cardiovascular;  Laterality: N/A;   Family History  Problem Relation Age of Onset  . Early death      Parents died young  . Appendicitis Mother     Pt was 90 year old  . Heart attack Father 46   Social History  Substance Use Topics  . Smoking status: Former Smoker -- 2.00 packs/day for 10 years    Types: Cigarettes    Start date: 07/14/1950    Quit date: 07/14/1961  . Smokeless tobacco: Current User    Types: Chew  . Alcohol Use: No    Review of Systems  Constitutional: Negative for chills.  Eyes: Negative for pain.  Respiratory: Negative for cough, choking and shortness of breath.   Cardiovascular: Positive for chest pain. Negative for palpitations and leg swelling.  Gastrointestinal: Negative for abdominal pain and anal bleeding.  Endocrine: Negative for polydipsia and polyuria.  Genitourinary: Negative for dysuria, frequency, enuresis and penile pain.  Musculoskeletal: Negative for back pain.  All other systems reviewed and are negative.     Allergies  Review of patient's allergies indicates no known allergies.  Home Medications   Prior to Admission medications   Medication Sig Start Date End Date Taking? Authorizing Provider  acetaminophen (TYLENOL) 325 MG tablet Take 2 tablets (650 mg total) by mouth every 4 (four) hours as needed for headache or mild pain. Patient taking differently: Take 325 mg by mouth 2 (two) times daily as needed for headache or mild pain.  05/21/14   Erlene Quan, PA-C  albuterol (PROVENTIL HFA;VENTOLIN HFA) 108 (90 BASE) MCG/ACT inhaler Inhale 2 puffs into the lungs every 6 (six) hours as needed for wheezing or shortness of breath.    Historical Provider, MD  aspirin EC 81 MG tablet Take 81 mg by mouth daily.    Historical Provider, MD  atorvastatin (LIPITOR) 40 MG tablet Take 1 tablet (40 mg total) by mouth daily at 6 PM. 05/29/13   Liliane Shi, PA-C   bismuth subsalicylate (PEPTO BISMOL) 262 MG/15ML suspension Take 30 mLs by mouth every 6 (six) hours as needed for indigestion or diarrhea or loose stools.     Historical Provider, MD  BRILINTA 90 MG TABS tablet TAKE 1 TABLET BY MOUTH TWICE DAILY. 01/16/15   Herminio Commons, MD  carvedilol (COREG) 3.125 MG tablet TAKE 1 TABLET BY MOUTH TWICE DAILY WITH MEALS. (HEART RATE & BLOOD PRESSURE) 03/05/15   Lendon Colonel, NP  diphenoxylate-atropine (LOMOTIL) 2.5-0.025 MG per tablet Take 1 tablet by mouth 4 (four) times daily as needed for diarrhea or loose stools. Patient not taking: Reported on 04/02/2015 01/07/15   Tanna Furry, MD  famotidine (PEPCID) 20 MG tablet Take 1 tablet (20 mg total) by mouth at bedtime. 03/03/15   Rexene Alberts, MD  furosemide (LASIX) 20 MG tablet Take 20 mg by mouth as needed for fluid. Take 1 tablet as needed if 3 lb weight gain. 05/06/13   Lendon Colonel, NP  glimepiride (AMARYL) 2 MG tablet FOR YOUR DIABETES WHILE OFF OF METFORMIN, TAKE 1 TABLET TODAY AND TOMORROW. Patient not taking: Reported on 04/02/2015 03/03/15  Rexene Alberts, MD  isosorbide mononitrate (IMDUR) 30 MG 24 hr tablet Take 1.5 tablets (45 mg total) by mouth daily. 03/03/15   Rexene Alberts, MD  levothyroxine (SYNTHROID, LEVOTHROID) 50 MCG tablet Take 50 mcg by mouth daily before breakfast.  09/08/14   Historical Provider, MD  loperamide (IMODIUM) 2 MG capsule Take 1 capsule (2 mg total) by mouth 4 (four) times daily as needed for diarrhea or loose stools. 04/01/15   Kristen N Ward, DO  metFORMIN (GLUCOPHAGE) 500 MG tablet Take 2 tablets (1,000 mg total) by mouth 2 (two) times daily with a meal. RESTART ON Sunday, 03/04/15. Patient taking differently: Take 1,000 mg by mouth 2 (two) times daily with a meal.  03/03/15   Rexene Alberts, MD  Multiple Vitamin (MULTIVITAMIN WITH MINERALS) TABS Take 1 tablet by mouth daily.    Historical Provider, MD  nitroGLYCERIN (NITROSTAT) 0.4 MG SL tablet Place 0.4 mg under the  tongue every 5 (five) minutes as needed for chest pain.    Historical Provider, MD  oxyCODONE-acetaminophen (PERCOCET) 5-325 MG per tablet Take 1 tablet by mouth every 6 (six) hours as needed for moderate pain. Patient not taking: Reported on 04/16/2015 03/03/15   Rexene Alberts, MD  potassium chloride (K-DUR) 10 MEQ tablet Take 10 mEq by mouth as needed. Takes along with the Furosemide when he has swelling.    Historical Provider, MD  tamsulosin (FLOMAX) 0.4 MG CAPS Take 0.4 mg by mouth daily after supper.  01/27/13   Historical Provider, MD   BP 124/66 mmHg  Pulse 66  Resp 17  SpO2 95% Physical Exam  Constitutional: He is oriented to person, place, and time. He appears well-developed and well-nourished.  HENT:  Head: Normocephalic and atraumatic.  Eyes: Conjunctivae and EOM are normal.  Neck: Normal range of motion. Neck supple.  Cardiovascular: Normal rate and regular rhythm.   Pulmonary/Chest: Effort normal. No respiratory distress.  Abdominal: Soft. There is no tenderness.  Musculoskeletal: Normal range of motion. He exhibits no edema or tenderness.  Neurological: He is alert and oriented to person, place, and time.  Skin: Skin is warm and dry.  Nursing note and vitals reviewed.   ED Course  Procedures (including critical care time) Labs Review Labs Reviewed  CBC - Abnormal; Notable for the following:    RBC 4.01 (*)    Hemoglobin 12.8 (*)    HCT 38.2 (*)    All other components within normal limits  COMPREHENSIVE METABOLIC PANEL  TROPONIN I    Imaging Review Dg Chest 2 View  04/27/2015  CLINICAL DATA:  Chest pain this morning. EXAM: CHEST  2 VIEW COMPARISON:  04/24/2015 FINDINGS: Postoperative changes in the mediastinum. Normal heart size and pulmonary vascularity. No focal airspace disease or consolidation in the lungs. No blunting of costophrenic angles. No pneumothorax. Degenerative changes in the spine. Calcification and torsion of the aorta. IMPRESSION: No active  cardiopulmonary disease. Electronically Signed   By: Lucienne Capers M.D.   On: 04/27/2015 06:59   I have personally reviewed and evaluated these images and lab results as part of my medical decision-making.   EKG Interpretation   Date/Time:  Friday April 27 2015 05:30:33 EDT Ventricular Rate:  75 PR Interval:  178 QRS Duration: 107 QT Interval:  407 QTC Calculation: 455 R Axis:   86 Text Interpretation:  Sinus rhythm Borderline right axis deviation  Abnormal inferior Q waves Repol abnrm suggests ischemia, anterolateral Q  waves improved from previous st depression similar to previous Confirmed  by [facility] MD, Corene Cornea [phone]) on 04/27/2015 5:36:03 AM      MDM   Final diagnoses:  Chest pain, unspecified chest pain type   Atypical chest pain however patient with a history of heart disease so we will do a troponin. His pain happened 3 hours ago does not have ongoing pain think one troponin will be sufficient to rule out any kind of ischemia. EKG similar to previous with some ST depressions however these are not new and were present with other chief complaints as well. Likely GI related however last him follow-up with his cardiologist if discharged from here. Labs normal, no more repeat of pain. Repeat ECG without any changes. Stable for dc home with cardiology and pcp follow up. Will return here for new/worsening symptoms.   I have personally and contemperaneously reviewed labs and imaging and used in my decision making as above.   A medical screening exam was performed and I feel the patient has had an appropriate workup for their chief complaint at this time and likelihood of emergent condition existing is low. They have been counseled on decision, discharge, follow up and which symptoms necessitate immediate return to the emergency department. They or their family verbally stated understanding and agreement with plan and discharged in stable condition.      Merrily Pew,  MD 04/27/15 2129

## 2015-04-27 NOTE — ED Notes (Signed)
Patient d/c papers given and reviewed. Patient verbalized understanding.

## 2015-05-02 ENCOUNTER — Emergency Department (HOSPITAL_COMMUNITY): Payer: Medicare Other

## 2015-05-02 ENCOUNTER — Emergency Department (HOSPITAL_COMMUNITY)
Admission: EM | Admit: 2015-05-02 | Discharge: 2015-05-02 | Disposition: A | Discharge status: Home / Self Care | Payer: Medicare Other | Attending: Emergency Medicine | Admitting: Emergency Medicine

## 2015-05-02 ENCOUNTER — Encounter (HOSPITAL_COMMUNITY): Payer: Self-pay

## 2015-05-02 DIAGNOSIS — R531 Weakness: Secondary | ICD-10-CM | POA: Diagnosis not present

## 2015-05-02 DIAGNOSIS — E119 Type 2 diabetes mellitus without complications: Secondary | ICD-10-CM | POA: Insufficient documentation

## 2015-05-02 DIAGNOSIS — Z8701 Personal history of pneumonia (recurrent): Secondary | ICD-10-CM | POA: Diagnosis not present

## 2015-05-02 DIAGNOSIS — G8929 Other chronic pain: Secondary | ICD-10-CM | POA: Diagnosis not present

## 2015-05-02 DIAGNOSIS — M1990 Unspecified osteoarthritis, unspecified site: Secondary | ICD-10-CM | POA: Diagnosis not present

## 2015-05-02 DIAGNOSIS — Z9889 Other specified postprocedural states: Secondary | ICD-10-CM | POA: Insufficient documentation

## 2015-05-02 DIAGNOSIS — Z8781 Personal history of (healed) traumatic fracture: Secondary | ICD-10-CM | POA: Diagnosis not present

## 2015-05-02 DIAGNOSIS — I2510 Atherosclerotic heart disease of native coronary artery without angina pectoris: Secondary | ICD-10-CM | POA: Diagnosis not present

## 2015-05-02 DIAGNOSIS — Z7982 Long term (current) use of aspirin: Secondary | ICD-10-CM | POA: Insufficient documentation

## 2015-05-02 DIAGNOSIS — E039 Hypothyroidism, unspecified: Secondary | ICD-10-CM | POA: Insufficient documentation

## 2015-05-02 DIAGNOSIS — Z8711 Personal history of peptic ulcer disease: Secondary | ICD-10-CM | POA: Diagnosis not present

## 2015-05-02 DIAGNOSIS — E785 Hyperlipidemia, unspecified: Secondary | ICD-10-CM | POA: Insufficient documentation

## 2015-05-02 DIAGNOSIS — F419 Anxiety disorder, unspecified: Secondary | ICD-10-CM | POA: Insufficient documentation

## 2015-05-02 DIAGNOSIS — N400 Enlarged prostate without lower urinary tract symptoms: Secondary | ICD-10-CM | POA: Diagnosis not present

## 2015-05-02 DIAGNOSIS — I252 Old myocardial infarction: Secondary | ICD-10-CM | POA: Insufficient documentation

## 2015-05-02 DIAGNOSIS — R0789 Other chest pain: Secondary | ICD-10-CM | POA: Insufficient documentation

## 2015-05-02 DIAGNOSIS — K219 Gastro-esophageal reflux disease without esophagitis: Secondary | ICD-10-CM | POA: Diagnosis not present

## 2015-05-02 DIAGNOSIS — Z8673 Personal history of transient ischemic attack (TIA), and cerebral infarction without residual deficits: Secondary | ICD-10-CM | POA: Diagnosis not present

## 2015-05-02 DIAGNOSIS — Z951 Presence of aortocoronary bypass graft: Secondary | ICD-10-CM | POA: Diagnosis not present

## 2015-05-02 DIAGNOSIS — Z87891 Personal history of nicotine dependence: Secondary | ICD-10-CM | POA: Diagnosis not present

## 2015-05-02 DIAGNOSIS — Z79899 Other long term (current) drug therapy: Secondary | ICD-10-CM | POA: Diagnosis not present

## 2015-05-02 DIAGNOSIS — R079 Chest pain, unspecified: Secondary | ICD-10-CM | POA: Diagnosis present

## 2015-05-02 DIAGNOSIS — I10 Essential (primary) hypertension: Secondary | ICD-10-CM | POA: Insufficient documentation

## 2015-05-02 HISTORY — DX: Other specified anxiety disorders: F41.8

## 2015-05-02 HISTORY — DX: Other symptoms and signs involving emotional state: R45.89

## 2015-05-02 LAB — CBC WITH DIFFERENTIAL/PLATELET
BASOS ABS: 0 10*3/uL (ref 0.0–0.1)
Basophils Relative: 1 %
EOS PCT: 3 %
Eosinophils Absolute: 0.3 10*3/uL (ref 0.0–0.7)
HCT: 40.1 % (ref 39.0–52.0)
HEMOGLOBIN: 13.7 g/dL (ref 13.0–17.0)
LYMPHS ABS: 1.5 10*3/uL (ref 0.7–4.0)
LYMPHS PCT: 20 %
MCH: 32.4 pg (ref 26.0–34.0)
MCHC: 34.2 g/dL (ref 30.0–36.0)
MCV: 94.8 fL (ref 78.0–100.0)
Monocytes Absolute: 0.6 10*3/uL (ref 0.1–1.0)
Monocytes Relative: 8 %
NEUTROS ABS: 5.2 10*3/uL (ref 1.7–7.7)
NEUTROS PCT: 68 %
PLATELETS: 165 10*3/uL (ref 150–400)
RBC: 4.23 MIL/uL (ref 4.22–5.81)
RDW: 13 % (ref 11.5–15.5)
WBC: 7.5 10*3/uL (ref 4.0–10.5)

## 2015-05-02 LAB — I-STAT CHEM 8, ED
BUN: 13 mg/dL (ref 6–20)
CHLORIDE: 103 mmol/L (ref 101–111)
CREATININE: 0.8 mg/dL (ref 0.61–1.24)
Calcium, Ion: 1.19 mmol/L (ref 1.13–1.30)
GLUCOSE: 202 mg/dL — AB (ref 65–99)
HEMATOCRIT: 42 % (ref 39.0–52.0)
Hemoglobin: 14.3 g/dL (ref 13.0–17.0)
POTASSIUM: 4.1 mmol/L (ref 3.5–5.1)
Sodium: 139 mmol/L (ref 135–145)
TCO2: 22 mmol/L (ref 0–100)

## 2015-05-02 LAB — BRAIN NATRIURETIC PEPTIDE: B NATRIURETIC PEPTIDE 5: 217 pg/mL — AB (ref 0.0–100.0)

## 2015-05-02 LAB — I-STAT TROPONIN, ED: Troponin i, poc: 0.02 ng/mL (ref 0.00–0.08)

## 2015-05-02 LAB — TROPONIN I: Troponin I: <0.03 ng/mL (ref <0.031)

## 2015-05-02 MED ORDER — SODIUM CHLORIDE 0.9 % IV BOLUS (SEPSIS)
500.0000 mL | Freq: Once | INTRAVENOUS | Status: AC
  Administered 2015-05-02: 500 mL via INTRAVENOUS

## 2015-05-02 MED ORDER — GI COCKTAIL ~~LOC~~
30.0000 mL | Freq: Once | ORAL | Status: AC
  Administered 2015-05-02: 30 mL via ORAL
  Filled 2015-05-02: qty 30

## 2015-05-02 NOTE — ED Provider Notes (Signed)
CSN: [id number]     Arrival date & time 05/02/15  1221 History   First MD Initiated Contact with Patient 05/02/15 1302     Chief Complaint  Patient presents with  . Chest Pain      HPI Pt was seen at 1300. Per pt, c/o gradual onset and resolution of one episode of "chest pain" that occurred this morning when he woke up. Describes the chest discomfort as "indigestion," that lasted only a few minutes before resolving. Pt did not take any meds to treat the discomfort. Pt also c/o feeling generally weak for the past 7 days. Pt also states he takes his BP at least 2 times per day, every day, and his "BP goes up and down." Pt currently denies CP. Denies palpitations, no SOB/cough, no abd pain, no N/V/D, no back pain, no focal motor weakness, no tingling/numbness in extremities, no visual changes, no facial droop, no syncope. The patient has a significant history of similar symptoms previously, recently being evaluated for this complaint and multiple prior evals for same. Pt has been evaluated in the ED 4 times in the past 2 weeks for this same complaint.      Past Medical History  Diagnosis Date  . Diabetes mellitus, type II ([place])   . Hyperlipidemia   . Coronary atherosclerosis of native coronary artery     a. CABG x 4 in 1989 (VG->OM1->OM2, VG->RCA, LIMA->LAD), b. 05/2010: DES to VG-OM1/OM2, DES to distal LCx. c. NSTEMI in 04/2011 - TO distal LCX stent and VG->OM2. d. 02/2012 NSTEMI DES to VG-OM1/continuation to OM2 occluded. e. inferior STEMI s/p DES to SVG-RAMUS 06/2012. f. inferolat STEMI 09/2012 s/p DES to SVG-interm; g. Lex MV (11/14):  EF 35%, inf-lat scar with small peri-infarct ischemia  . Essential hypertension, benign   . Osteoarthritis   . History of stroke   . History of pneumonia   . Cervical vertebral fracture ([place])   . Chronic back pain   . Benign prostatic hypertrophy     History of urinary retention  . Peptic ulcer disease   . Gastroesophageal reflux disease   . Ischemic  cardiomyopathy Nov 2015    EF 35% cath, 45-50% by echo  . Hypothyroidism   . Anxiety about health     "multiple somatic complaints"   Past Surgical History  Procedure Laterality Date  . Tonsillectomy    . Coronary artery bypass graft  1989  . Coronary angioplasty  10/12, 8/13, 12/13, 3/14    SVG-OM PCI  . Cardiac catheterization  05/19/14    SVG-OM occl- medical Rx  . Left heart catheterization with coronary angiogram N/A 11/01/2011    Procedure: LEFT HEART CATHETERIZATION WITH CORONARY ANGIOGRAM;  Surgeon: Lorretta Harp, MD;  Location: [facility] CATH LAB;  Service: Cardiovascular;  Laterality: N/A;  . Percutaneous coronary stent intervention (pci-s) N/A 11/01/2011    Procedure: PERCUTANEOUS CORONARY STENT INTERVENTION (PCI-S);  Surgeon: Lorretta Harp, MD;  Location: [facility] CATH LAB;  Service: Cardiovascular;  Laterality: N/A;  . Left heart catheterization with coronary/graft angiogram N/A 03/10/2012    Procedure: LEFT HEART CATHETERIZATION WITH [name];  Surgeon: Sherren Mocha, MD;  Location: [facility] CATH LAB;  Service: Cardiovascular;  Laterality: N/A;  . Left heart catheterization with coronary angiogram N/A 06/18/2012    Procedure: LEFT HEART CATHETERIZATION WITH CORONARY ANGIOGRAM;  Surgeon: Peter M Martinique, MD;  Location: [facility] CATH LAB;  Service: Cardiovascular;  Laterality: N/A;  . Percutaneous coronary stent intervention (pci-s)  06/18/2012    Procedure:  PERCUTANEOUS CORONARY STENT INTERVENTION (PCI-S);  Surgeon: Peter M Martinique, MD;  Location[facility] CATH LAB;  Service: Cardiovascular;;  . Left heart catheterization with coronary/graft angiogram  10/06/2012    Procedure: LEFT HEART CATHETERIZATION WITH [name];  Surgeon: Burnell Blanks, MD;  Location: [facility] CATH LAB;  Service: Cardiovascular;;  . Percutaneous coronary stent intervention (pci-s)  10/06/2012    Procedure: PERCUTANEOUS CORONARY STENT INTERVENTION (PCI-S);  Surgeon: Burnell Blanks, MD;   Location: [facility] CATH LAB;  Service: Cardiovascular;;  . Left heart catheterization with coronary/graft angiogram N/A 11/09/2012    Procedure: LEFT HEART CATHETERIZATION WITH [name];  Surgeon: Peter M Martinique, MD;  Location: [facility] CATH LAB;  Service: Cardiovascular;  Laterality: N/A;  . Left heart catheterization with coronary/graft angiogram N/A 05/19/2014    Procedure: LEFT HEART CATHETERIZATION WITH [name];  Surgeon: Troy Sine, MD;  Location: [facility] CATH LAB;  Service: Cardiovascular;  Laterality: N/A;   Family History  Problem Relation Age of Onset  . Early death      Parents died young  . Appendicitis Mother     Pt was 89 year old  . Heart attack Father 66   Social History  Substance Use Topics  . Smoking status: Former Smoker -- 2.00 packs/day for 10 years    Types: Cigarettes    Start date: 07/14/1950    Quit date: 07/14/1961  . Smokeless tobacco: Current User    Types: Chew  . Alcohol Use: No    Review of Systems ROS: Statement: All systems negative except as marked or noted in the HPI; Constitutional: Negative for fever and chills. +generalized weakness. ; ; Eyes: Negative for eye pain, redness and discharge. ; ; ENMT: Negative for ear pain, hoarseness, nasal congestion, sinus pressure and sore throat. ; ; Cardiovascular: Negative for palpitations, diaphoresis, dyspnea and peripheral edema. ; ; Respiratory: Negative for cough, wheezing and stridor. ; ; Gastrointestinal: Negative for nausea, vomiting, diarrhea, abdominal pain, blood in stool, hematemesis, jaundice and rectal bleeding. . ; ; Genitourinary: Negative for dysuria, flank pain and hematuria. ; ; Musculoskeletal: +CP. Negative for back pain and neck pain. Negative for swelling and trauma.; ; Skin: Negative for pruritus, rash, abrasions, blisters, bruising and skin lesion.; ; Neuro: Negative for headache, lightheadedness and neck stiffness. Negative for altered level of consciousness , altered  mental status, extremity weakness, paresthesias, involuntary movement, seizure and syncope.; Psych:  +anxiety. No SI, no SA, no HI, no hallucinations.     Allergies  Review of patient's allergies indicates no known allergies.  Home Medications   Prior to Admission medications   Medication Sig Start Date End Date Taking? Authorizing Provider  acetaminophen (TYLENOL) 325 MG tablet Take 2 tablets (650 mg total) by mouth every 4 (four) hours as needed for headache or mild pain. Patient taking differently: Take 325 mg by mouth 2 (two) times daily as needed for headache or mild pain.  05/21/14  Yes Luke K Kilroy, PA-C  albuterol (PROVENTIL HFA;VENTOLIN HFA) 108 (90 BASE) MCG/ACT inhaler Inhale 2 puffs into the lungs every 6 (six) hours as needed for wheezing or shortness of breath.   Yes Historical Provider, MD  aspirin EC 81 MG tablet Take 81 mg by mouth daily.   Yes Historical Provider, MD  atorvastatin (LIPITOR) 40 MG tablet Take 1 tablet (40 mg total) by mouth daily at 6 PM. 05/29/13  Yes Scott T Kathlen Mody, PA-C  bismuth subsalicylate (PEPTO BISMOL) 262 MG/15ML suspension Take 30 mLs by mouth every 6 (  six) hours as needed for indigestion or diarrhea or loose stools.    Yes Historical Provider, MD  BRILINTA 90 MG TABS tablet TAKE 1 TABLET BY MOUTH TWICE DAILY. 01/16/15  Yes Herminio Commons, MD  carvedilol (COREG) 3.125 MG tablet TAKE 1 TABLET BY MOUTH TWICE DAILY WITH MEALS. (HEART RATE & BLOOD PRESSURE) 03/05/15  Yes Lendon Colonel, NP  famotidine (PEPCID) 20 MG tablet Take 1 tablet (20 mg total) by mouth at bedtime. 03/03/15  Yes Rexene Alberts, MD  isosorbide mononitrate (IMDUR) 30 MG 24 hr tablet Take 1.5 tablets (45 mg total) by mouth daily. 03/03/15  Yes Rexene Alberts, MD  levothyroxine (SYNTHROID, LEVOTHROID) 50 MCG tablet Take 50 mcg by mouth daily before breakfast.  09/08/14  Yes Historical Provider, MD  loperamide (IMODIUM) 2 MG capsule Take 1 capsule (2 mg total) by mouth 4 (four) times  daily as needed for diarrhea or loose stools. 04/01/15  Yes Kristen N Ward, DO  metFORMIN (GLUCOPHAGE) 500 MG tablet Take 2 tablets (1,000 mg total) by mouth 2 (two) times daily with a meal. RESTART ON Sunday, 03/04/15. Patient taking differently: Take 1,000 mg by mouth 2 (two) times daily with a meal.  03/03/15  Yes Rexene Alberts, MD  Multiple Vitamin (MULTIVITAMIN WITH MINERALS) TABS Take 1 tablet by mouth daily.   Yes Historical Provider, MD  nitroGLYCERIN (NITROSTAT) 0.4 MG SL tablet Place 0.4 mg under the tongue every 5 (five) minutes as needed for chest pain.   Yes Historical Provider, MD  potassium chloride (K-DUR) 10 MEQ tablet Take 10 mEq by mouth as needed. Takes along with the Furosemide when he has swelling.   Yes Historical Provider, MD  tamsulosin (FLOMAX) 0.4 MG CAPS Take 0.4 mg by mouth daily after supper.  01/27/13  Yes Historical Provider, MD  furosemide (LASIX) 20 MG tablet Take 20 mg by mouth as needed for fluid. Take 1 tablet as needed if 3 lb weight gain. 05/06/13   Lendon Colonel, NP   BP 152/81 mmHg  Pulse 72  Temp(Src) 97.6 F (36.4 C) (Oral)  Resp 13  Ht 5\' 10"  (1.778 m)  Wt 170 lb (77.111 kg)  BMI 24.39 kg/m2  SpO2 97%    13:26 Orthostatic Vital Signs CS  Orthostatic Lying  - BP- Lying: 139/75 mmHg ; Pulse- Lying: 77  Orthostatic Sitting - BP- Sitting: 119/71 mmHg ; Pulse- Sitting: 78  Orthostatic Standing at 0 minutes - BP- Standing at 0 minutes: 99/74 mmHg ; Pulse- Standing at 0 minutes: 87     Filed Vitals:   05/02/15 1325 05/02/15 1330 05/02/15 1436 05/02/15 1607  BP: 119/70 141/84 152/81 163/82  Pulse: 77 79 72 79  Temp: 98.2 F (36.8 C)  97.6 F (36.4 C) 98.5 F (36.9 C)  TempSrc: Oral  Oral Oral  Resp: 14 22 13 16   Height:      Weight:      SpO2: 98% 100% 97% 99%     Physical Exam  1305: Physical examination:  Nursing notes reviewed; Vital signs and O2 SAT reviewed;  Constitutional: Well developed, Well nourished, Well hydrated, In no  acute distress; Head:  Normocephalic, atraumatic; Eyes: EOMI, PERRL, No scleral icterus; ENMT: Mouth and pharynx normal, Mucous membranes moist; Neck: Supple, Full range of motion, No lymphadenopathy; Cardiovascular: Regular rate and rhythm, No gallop; Respiratory: Breath sounds clear & equal bilaterally, No wheezes.  Speaking full sentences with ease, Normal respiratory effort/excursion; Chest: Nontender, Movement normal; Abdomen: Soft, Nontender, Nondistended, Normal bowel  sounds; Genitourinary: No CVA tenderness; Extremities: Pulses normal, No tenderness, No edema, No calf edema or asymmetry.; Neuro: AA&Ox3, Major CN grossly intact. No facial droop. Speech clear. No gross focal motor or sensory deficits in extremities.; Skin: Color normal, Warm, Dry.; Psych:  Anxious.    ED Course  Procedures (including critical care time) Labs Review   Imaging Review  I have personally reviewed and evaluated these images and lab results as part of my medical decision-making.   EKG Interpretation None      MDM  MDM Reviewed: previous chart, nursing note and vitals Reviewed previous: labs and ECG Interpretation: labs, ECG and x-ray     ED ECG REPORT   Date: 05/02/2015  Rate: 65  Rhythm: normal sinus rhythm  QRS Axis: normal  Intervals: normal  ST/T Wave abnormalities: nonspecific ST/T changes, lateral leads  Conduction Disutrbances:none  Narrative Interpretation:   Old EKG Reviewed: unchanged; no significant changes from previous EKG dated 04/27/15, 04/24/15, 04/16/15.    Results for orders placed or performed during the hospital encounter of 05/02/15  Brain natriuretic peptide  Result Value Ref Range   B Natriuretic Peptide 217.0 (H) 0.0 - 100.0 pg/mL  CBC with Differential  Result Value Ref Range   WBC 7.5 4.0 - 10.5 K/uL   RBC 4.23 4.22 - 5.81 MIL/uL   Hemoglobin 13.7 13.0 - 17.0 g/dL   HCT 40.1 39.0 - 52.0 %   MCV 94.8 78.0 - 100.0 fL   MCH 32.4 26.0 - 34.0 pg   MCHC 34.2 30.0  - 36.0 g/dL   RDW 13.0 11.5 - 15.5 %   Platelets 165 150 - 400 K/uL   Neutrophils Relative % 68 %   Neutro Abs 5.2 1.7 - 7.7 K/uL   Lymphocytes Relative 20 %   Lymphs Abs 1.5 0.7 - 4.0 K/uL   Monocytes Relative 8 %   Monocytes Absolute 0.6 0.1 - 1.0 K/uL   Eosinophils Relative 3 %   Eosinophils Absolute 0.3 0.0 - 0.7 K/uL   Basophils Relative 1 %   Basophils Absolute 0.0 0.0 - 0.1 K/uL  Troponin I  Result Value Ref Range   Troponin I <0.03 <0.031 ng/mL  I-stat Chem 8, ED  Result Value Ref Range   Sodium 139 135 - 145 mmol/L   Potassium 4.1 3.5 - 5.1 mmol/L   Chloride 103 101 - 111 mmol/L   BUN 13 6 - 20 mg/dL   Creatinine, Ser 0.80 0.61 - 1.24 mg/dL   Glucose, Bld 202 (H) 65 - 99 mg/dL   Calcium, Ion 1.19 1.13 - 1.30 mmol/L   TCO2 22 0 - 100 mmol/L   Hemoglobin 14.3 13.0 - 17.0 g/dL   HCT 42.0 39.0 - 52.0 %  I-stat troponin, ED  Result Value Ref Range   Troponin i, poc 0.02 0.00 - 0.08 ng/mL   Comment 3           Dg Chest 2 View 05/02/2015  CLINICAL DATA:  Chest pain EXAM: CHEST  2 VIEW COMPARISON:  04/27/2015 FINDINGS: No cardiomegaly. Stable aortic contours. The patient is status post CABG. There is no edema, consolidation, effusion, or pneumothorax. No acute osseous findings. IMPRESSION: No active cardiopulmonary disease. Electronically Signed   By: Monte Fantasia M.D.   On: 05/02/2015 14:27    1325:   T/C to Cardiology Dr. Bronson Ing, case discussed, including:  HPI, pertinent PM/SHx, VS/PE, dx testing, ED course and treatment:  Pt can be discharged and he will have office call  pt to schedule f/u appt.   1515:  Pt mildly orthostatic on VS, but denied symptoms while VS were being taken. Judicious IVF given. Workup reassuring. No acute CHF on CXR. Doubt PE as cause for symptoms with low risk [place].  Doubt ACS as cause for symptoms with normal troponin and unchanged EKG from previous after 2 weeks of intermittent atypical symptoms. Long d/w pt regarding taking BP at home (not  every day, not multiple times per day); pt verb understanding. Pt wants to go home now. Dx and testing, as well as d/w Cards MD, d/w pt.  Questions answered.  Verb understanding, agreeable to d/c home with outpt f/u.     Francine Graven, DO 05/04/15 2355

## 2015-05-02 NOTE — Discharge Instructions (Signed)
°Emergency Department Resource Guide °1) Find a Doctor and Pay Out of Pocket °Although you won't have to find out who is covered by your insurance plan, it is a good idea to ask around and get recommendations. You will then need to call the office and see if the doctor you have chosen will accept you as a new patient and what types of options they offer for patients who are self-pay. Some doctors offer discounts or will set up payment plans for their patients who do not have insurance, but you will need to ask so you aren't surprised when you get to your appointment. ° °2) Contact Your Local Health Department °Not all health departments have doctors that can see patients for sick visits, but many do, so it is worth a call to see if yours does. If you don't know where your local health department is, you can check in your phone book. The CDC also has a tool to help you locate your state's health department, and many state websites also have listings of all of their local health departments. ° °3) Find a Walk-in Clinic °If your illness is not likely to be very severe or complicated, you may want to try a walk in clinic. These are popping up all over the country in pharmacies, drugstores, and shopping centers. They're usually staffed by nurse practitioners or physician assistants that have been trained to treat common illnesses and complaints. They're usually fairly quick and inexpensive. However, if you have serious medical issues or chronic medical problems, these are probably not your best option. ° °No Primary Care Doctor: °- Call [facility] at  832-8000 - they can help you locate a primary care doctor that  accepts your insurance, provides certain services, etc. °- Physician Referral Service- 1-800-533-3463 ° °Chronic Pain Problems: °Organization         Address  Phone   Notes  °[facility]  (336) 297-2271 Patients need to be referred by their primary care doctor.  ° °Medication  Assistance: °Organization         Address  Phone   Notes  °[place] [facility] [address] °[place], [place] [address] (336) 641-8030 --Must be a resident of [place] °-- Must have NO insurance coverage whatsoever (no Medicaid/ Medicare, etc.) °-- The pt. MUST have a primary care doctor that directs their care regularly and follows them in the community °  °[facility]  (866) 331-1348   °[employer]  (888) 892-1162   ° °Agencies that provide inexpensive medical care: °Organization         Address  Phone   Notes  °[facility]  (336) 832-8035   °[facility]    (336) 832-7272   °[facility] [address] °[place],  [address] (336) 832-4777   °[facility] [address], °[place] (336) 271-4999   °[facility]    (336) 373-0678   °[facility]    (336) 272-1050   °[facility] ° [address], [place] Phone:  (336) 832-4444, Fax:  (336) 832-4440 Hours of Operation:  9 am - 6 pm, M-F.  Also accepts Medicaid/Medicare and self-pay.  °[facility] ° [address],  Phone: (336) 832-3150, Fax: (336) [phone]. Hours of Operation:  8:30 am - 5:30 pm, M-F.  Also accepts Medicaid and self-pay.  °[facility] [place] [address], [place] Phone: (336) 878-6027   °[facility] [address], [place], [place] (336)723-1848, Ext. 123 Mondays & Thursdays: 7-9 AM.  First 15 patients are seen on a first come, first serve basis. °  ° °Medicaid-accepting [place] Providers: ° °Organization         Address  Phone   Notes  °[facility] [address], [place] (336) 641-2100 Also accepts self-pay patients.  °[facility] [address], [place] ° (336) 856-9996   °[facility] [address], [place]  (336) 288-8857   °[facility] [address] [place] [address], [place] (336) 299-7000   °Veita Bland [address], [place]  ° (336) 373-1557 Only accepts [place] Access Medicaid patients after they have their name applied to their card.  ° °Self-Pay (no insurance) in [place]: ° °Organization         Address  Phone   Notes  °Sickle Cell Patients, [facility] [address], [place] (336) 832-1970   °[facility] [address], [place] (336) 832-4400   °[facility] ° [address],  (336) 992-4800   °[facility]/Dr. Osei-Bonsu ° [address] [place] [address], [place] or [address], [place] (336) [phone] Phone number for both [place] and [place] locations is the same.  °[facility] [address], [place] (336) 299-0000   °[facility] [address] [place] [address], [place] or [address] (336) 852-7530 °(336) 878-2260   °[facility] [address], [place] (336) 350-1642, phone; (336) 294-5005, fax Sees patients 1st and 3rd Saturday of every month.  Must not qualify for public or private insurance (i.e. Medicaid, Medicare, [place] Health Choice, Veterans' Benefits) • Household income should be no more than 200% of the poverty level •The clinic cannot treat you if you are pregnant or think you are pregnant • Sexually transmitted diseases are not treated at the clinic.  ° ° °Dental Care: °Organization         Address  Phone  Notes  °[place] [facility] [address],  (336) 641-6152 Accepts children up to age 21 who are enrolled in Medicaid or [place] Health Choice; pregnant women with a Medicaid card; and children who have applied for Medicaid or [place] Health Choice, but were declined, whose parents can pay a reduced fee at time of service.  °[place]  [facility] [place]  [address], [place] (336) 641-7733 Accepts children up to age 21 who are enrolled in Medicaid or [place] Health Choice; pregnant women with a Medicaid card; and children who have applied for Medicaid or [place] Health Choice, but were declined, whose parents can pay a reduced fee at time of service.  °[facility] ° [address],  (336) 641-4533 Patients are seen by appointment only. Walk-ins are not accepted. [facility] will see patients 18 years of age and older. °Monday - Tuesday (8am-5pm) °Most Wednesdays (8:30-5pm) °$30 per visit, cash only  °[facility] ° [address], [place] (336) 641-4533 Patients are seen by appointment only. Walk-ins are not accepted. [facility] will see patients 18 years of age and older. °One   Wednesday Evening (Monthly: Volunteer Based).  $30 per visit, cash only  °[facility]  (919) [phone] for adults; Children under age 4, call Graduate Pediatric Dentistry at ([phone]) [phone]. Children aged 4-14, please call ([phone]) [phone] to request a pediatric application. ° Dental services are provided in all areas of dental care including fillings, crowns and bridges, complete and partial dentures, implants, gum treatment, root canals, and extractions. Preventive care is also provided. Treatment is provided to both adults and children. °Patients are selected via a lottery and there is often a waiting list. °  °[facility] [address], °[place] ° (336) 763-8833 [email or web address] °  °[facility] [address], [place], [place] (336)723-1848, Ext. 123 Second and Fourth Thursday of each month, opens at 6:30 AM; Clinic ends at 9 AM.  Patients are seen on a first-come first-served basis, and a limited number are seen during each clinic.  ° °[facility] ° [address], [place], [place] (336) 723-7904    Eligibility Requirements °You must have lived in [place], [place], or [place] counties for at least the last three months. °  You cannot be eligible for state or federal sponsored healthcare insurance, including Veterans Administration, Medicaid, or Medicare. °  You generally cannot be eligible for healthcare insurance through your employer.  °  How to apply: °Eligibility screenings are held every Tuesday and Wednesday afternoon from 1:00 pm until 4:00 pm. You do not need an appointment for the interview!  °[facility] [address], [place], [place] 336-631-2330   °[facility]  336-342-8273   °[facility]  336-703-3100   °[facility]  336-570-6415   ° °Behavioral Health Resources in the Community: °Intensive Outpatient Programs °Organization         Address  Phone  Notes  °[facility] [address], [place], [place] 336-878-6098   °[facility] [address], [place], [place] 336-832-9800   °[facility] [address], [place], [place] ° 336-882-2125   °[facility] [address],  °[place], [place] 1-800-853-5163 or 336-641-4981   °Substance Abuse Resources °Organization         Address  Phone  Notes  °[facility]  336-882-2125   °[facility]  336-784-9470   °[facility]  336-285-9073   °[facility]  336-845-3988   °Residential & Outpatient Substance Abuse Program  1-800-659-3381   °Psychological Services °Organization         Address  Phone  Notes  °[facility]  336- 832-9600   °[facility]  336- 378-7881   °[facility] [address], [place] 1-800-853-5163 or 336-641-4981   ° °Mobile Crisis Teams °Organization         Address  Phone  Notes  °[facility], Mobile Crisis Care Unit  1-877-626-1772   °Assertive °[facility] ° [address]  [place], [place] 336-834-9664   °Sharon DeEsch [address] °[place] 336-554-5454   ° °Self-Help/Support Groups °Organization         Address  Phone             Notes  °[facility] [place] - variety of support groups  336- 373-1402 Call for more information  °Narcotics Anonymous (NA), [facility] [address], °[place]  2 meetings at this location  ° °  Residential Treatment Programs Organization         Address  Phone  Notes  [facility] [address],    [place]  1-715-338-0571   [facility]  [address], [place] [address], [place], [place]   [place], [place] [phone] Admissions: 8am-3pm M-F  [facility] [place] [address]    [place], [place] 432-761-4709   [facility] [address] [place], [place], [place]   [facility] [address]  [place], [place]   [facility] - Intensive Outpatient [address], [place], [place]   [facility] ([place]  [place], [place] 1-478 445 0799 or (984)648-6425   [facility] [address] [place], [place] Accepts Medicaid  [facility] [address],  [place] 1-940-814-3449 Substance Abuse/Addiction Treatment   [facility]         Address  Phone  Notes  [facility]  (772)108-7797   Domenic Schwab, PhD [address] Arlis Porta [place], [place]   249-135-6081 or 670-022-2860   [place] [facility] [address] [place], [place] 937-733-7446   [facility] [address] [place], [place] 714-186-1651 Insurance/Medicaid/sponsorship through [facility] [address] [place]                                    [place], [place] (571)047-5861 [place] [address], [place] (919) 683-9525    Dr. Adele Schilder  (331)047-6462   [facility] Dept. 1) [address] [place] 2) [place] 3)  [address], [place] ([phone]) [phone]  (531) [phone]   [place] 662-857-0015 or [phone] (After Hours)      Take your usual prescriptions as previously directed. Take your blood pressure only ONCE per day, either in the morning after you take your medicine(s) or in the evening before you go to bed, only once or twice per week.  Always sit quietly for at least 15 minutes before taking your blood pressure.  Keep a diary of your blood pressures to show your doctor at your follow up office visit.  Your Cardiologist's office will call you with a follow up appointment time/date.  If you do not hear from them by tomorrow, call your regular Cardiologist tomorrow to schedule a follow up appointment within the week.  Return to the Emergency Department immediately sooner if worsening.

## 2015-05-02 NOTE — ED Notes (Signed)
Pt c/o mid aching chest pain that occurred this morning upon wakening and lasted about 5 minutes. Pt reports intermittent similar pain since then, none currently. Pt reports he had SOB with the chest pain but states, "this is normal for me, I am always short of breath." Pt reports weakness x 7 days. Denies nausea, vomiting, and radiation of pain.

## 2015-05-04 ENCOUNTER — Emergency Department (HOSPITAL_COMMUNITY): Payer: Medicare Other

## 2015-05-04 ENCOUNTER — Encounter (HOSPITAL_COMMUNITY): Payer: Self-pay | Admitting: *Deleted

## 2015-05-04 ENCOUNTER — Emergency Department (HOSPITAL_COMMUNITY)
Admission: EM | Admit: 2015-05-04 | Discharge: 2015-05-04 | Disposition: A | Discharge status: Home / Self Care | Payer: Medicare Other | Attending: Emergency Medicine | Admitting: Emergency Medicine

## 2015-05-04 DIAGNOSIS — I2510 Atherosclerotic heart disease of native coronary artery without angina pectoris: Secondary | ICD-10-CM | POA: Insufficient documentation

## 2015-05-04 DIAGNOSIS — R5383 Other fatigue: Secondary | ICD-10-CM | POA: Diagnosis not present

## 2015-05-04 DIAGNOSIS — G8929 Other chronic pain: Secondary | ICD-10-CM | POA: Diagnosis not present

## 2015-05-04 DIAGNOSIS — M1990 Unspecified osteoarthritis, unspecified site: Secondary | ICD-10-CM | POA: Insufficient documentation

## 2015-05-04 DIAGNOSIS — E119 Type 2 diabetes mellitus without complications: Secondary | ICD-10-CM | POA: Insufficient documentation

## 2015-05-04 DIAGNOSIS — Z8781 Personal history of (healed) traumatic fracture: Secondary | ICD-10-CM | POA: Diagnosis not present

## 2015-05-04 DIAGNOSIS — Z8673 Personal history of transient ischemic attack (TIA), and cerebral infarction without residual deficits: Secondary | ICD-10-CM | POA: Insufficient documentation

## 2015-05-04 DIAGNOSIS — E039 Hypothyroidism, unspecified: Secondary | ICD-10-CM | POA: Diagnosis not present

## 2015-05-04 DIAGNOSIS — Z8659 Personal history of other mental and behavioral disorders: Secondary | ICD-10-CM | POA: Diagnosis not present

## 2015-05-04 DIAGNOSIS — N400 Enlarged prostate without lower urinary tract symptoms: Secondary | ICD-10-CM | POA: Diagnosis not present

## 2015-05-04 DIAGNOSIS — Z8701 Personal history of pneumonia (recurrent): Secondary | ICD-10-CM | POA: Diagnosis not present

## 2015-05-04 DIAGNOSIS — Z8711 Personal history of peptic ulcer disease: Secondary | ICD-10-CM | POA: Diagnosis not present

## 2015-05-04 DIAGNOSIS — R1084 Generalized abdominal pain: Secondary | ICD-10-CM | POA: Insufficient documentation

## 2015-05-04 DIAGNOSIS — Z7982 Long term (current) use of aspirin: Secondary | ICD-10-CM | POA: Diagnosis not present

## 2015-05-04 DIAGNOSIS — K5900 Constipation, unspecified: Secondary | ICD-10-CM | POA: Insufficient documentation

## 2015-05-04 DIAGNOSIS — Z79899 Other long term (current) drug therapy: Secondary | ICD-10-CM | POA: Insufficient documentation

## 2015-05-04 LAB — COMPREHENSIVE METABOLIC PANEL
ALK PHOS: 73 U/L (ref 38–126)
ALT: 23 U/L (ref 17–63)
ANION GAP: 9 (ref 5–15)
AST: 29 U/L (ref 15–41)
Albumin: 3.7 g/dL (ref 3.5–5.0)
BUN: 15 mg/dL (ref 6–20)
CALCIUM: 9.2 mg/dL (ref 8.9–10.3)
CO2: 24 mmol/L (ref 22–32)
CREATININE: 0.85 mg/dL (ref 0.61–1.24)
Chloride: 103 mmol/L (ref 101–111)
Glucose, Bld: 222 mg/dL — ABNORMAL HIGH (ref 65–99)
Potassium: 4 mmol/L (ref 3.5–5.1)
Sodium: 136 mmol/L (ref 135–145)
Total Bilirubin: 0.4 mg/dL (ref 0.3–1.2)
Total Protein: 6.3 g/dL — ABNORMAL LOW (ref 6.5–8.1)

## 2015-05-04 LAB — I-STAT CG4 LACTIC ACID, ED
LACTIC ACID, VENOUS: 3.54 mmol/L — AB (ref 0.5–2.0)
Lactic Acid, Venous: 1.21 mmol/L (ref 0.5–2.0)
Lactic Acid, Venous: 0.85 mmol/L (ref 0.5–2.0)

## 2015-05-04 LAB — CBC
HCT: 38.4 % — ABNORMAL LOW (ref 39.0–52.0)
HEMOGLOBIN: 12.8 g/dL — AB (ref 13.0–17.0)
MCH: 31.7 pg (ref 26.0–34.0)
MCHC: 33.3 g/dL (ref 30.0–36.0)
MCV: 95 fL (ref 78.0–100.0)
PLATELETS: 156 10*3/uL (ref 150–400)
RBC: 4.04 MIL/uL — AB (ref 4.22–5.81)
RDW: 13.1 % (ref 11.5–15.5)
WBC: 6.2 10*3/uL (ref 4.0–10.5)

## 2015-05-04 LAB — URINALYSIS, ROUTINE W REFLEX MICROSCOPIC
Bilirubin Urine: NEGATIVE
Glucose, UA: 250 mg/dL — AB
Hgb urine dipstick: NEGATIVE
LEUKOCYTES UA: NEGATIVE
NITRITE: NEGATIVE
PROTEIN: NEGATIVE mg/dL
Specific Gravity, Urine: 1.01 (ref 1.005–1.030)
Urobilinogen, UA: 0.2 mg/dL (ref 0.0–1.0)
pH: 7 (ref 5.0–8.0)

## 2015-05-04 LAB — I-STAT TROPONIN, ED: TROPONIN I, POC: 0.01 ng/mL (ref 0.00–0.08)

## 2015-05-04 LAB — LIPASE, BLOOD: LIPASE: 23 U/L (ref 11–51)

## 2015-05-04 MED ORDER — SODIUM CHLORIDE 0.9 % IV BOLUS (SEPSIS)
500.0000 mL | Freq: Once | INTRAVENOUS | Status: AC
  Administered 2015-05-04: 500 mL via INTRAVENOUS

## 2015-05-04 MED ORDER — SODIUM CHLORIDE 0.9 % IV BOLUS (SEPSIS)
250.0000 mL | Freq: Once | INTRAVENOUS | Status: AC
  Administered 2015-05-04: 250 mL via INTRAVENOUS

## 2015-05-04 MED ORDER — SODIUM CHLORIDE 0.9 % IV SOLN
INTRAVENOUS | Status: DC
  Administered 2015-05-04: 14:00:00 via INTRAVENOUS

## 2015-05-04 MED ORDER — IOHEXOL 300 MG/ML  SOLN
25.0000 mL | Freq: Once | INTRAMUSCULAR | Status: AC | PRN
  Administered 2015-05-04: 25 mL via ORAL

## 2015-05-04 MED ORDER — IOHEXOL 300 MG/ML  SOLN
100.0000 mL | Freq: Once | INTRAMUSCULAR | Status: AC | PRN
  Administered 2015-05-04: 100 mL via INTRAVENOUS

## 2015-05-04 NOTE — Discharge Instructions (Signed)
Make a point to follow-up with your doctor. Extensive workup here today without any significant findings. Blood pressures back to normal. Return for any new or worse symptoms. CT scan did not show any evidence of any significant constipation.

## 2015-05-04 NOTE — ED Notes (Signed)
Pt states generalized weakness since waking this morning. Pt also states his blood pressure has been running low, "in the 90's and 100's" NAD.

## 2015-05-04 NOTE — ED Notes (Signed)
Pt has been asking for a meal try continually. Pt has been here since 11 am and has not had anything to eat or drink. Pt is diabetic.

## 2015-05-04 NOTE — ED Provider Notes (Addendum)
CSN: [id number]     Arrival date & time 05/04/15  1220 History   First MD Initiated Contact with Patient 05/04/15 1256     Chief Complaint  Patient presents with  . Fatigue     (Consider location/radiation/quality/duration/timing/severity/associated sxs/prior Treatment) The history is provided by the patient.    81 year old male frequently seen for chest pain but today here for fatigue and low blood pressure and generalized abdominal pain and concern for constipation. Abdominal pain is mild and not severe. No NVD. Blood pressure at home was 90 - 782'N systolic and is usually 562'Z. No fever. No chest pain. Systems started this morning.       Past Medical History  Diagnosis Date  . Diabetes mellitus, type II ([place])   . Hyperlipidemia   . Coronary atherosclerosis of native coronary artery     a. CABG x 4 in 1989 (VG->OM1->OM2, VG->RCA, LIMA->LAD), b. 05/2010: DES to VG-OM1/OM2, DES to distal LCx. c. NSTEMI in 04/2011 - TO distal LCX stent and VG->OM2. d. 02/2012 NSTEMI DES to VG-OM1/continuation to OM2 occluded. e. inferior STEMI s/p DES to SVG-RAMUS 06/2012. f. inferolat STEMI 09/2012 s/p DES to SVG-interm; g. Lex MV (11/14):  EF 35%, inf-lat scar with small peri-infarct ischemia  . Essential hypertension, benign   . Osteoarthritis   . History of stroke   . History of pneumonia   . Cervical vertebral fracture ([place])   . Chronic back pain   . Benign prostatic hypertrophy     History of urinary retention  . Peptic ulcer disease   . Gastroesophageal reflux disease   . Ischemic cardiomyopathy Nov 2015    EF 35% cath, 45-50% by echo  . Hypothyroidism   . Anxiety about health     "multiple somatic complaints"   Past Surgical History  Procedure Laterality Date  . Tonsillectomy    . Coronary artery bypass graft  1989  . Coronary angioplasty  10/12, 8/13, 12/13, 3/14    SVG-OM PCI  . Cardiac catheterization  05/19/14    SVG-OM occl- medical Rx  . Left heart catheterization with  coronary angiogram N/A 11/01/2011    Procedure: LEFT HEART CATHETERIZATION WITH CORONARY ANGIOGRAM;  Surgeon: Lorretta Harp, MD;  Location: [facility] CATH LAB;  Service: Cardiovascular;  Laterality: N/A;  . Percutaneous coronary stent intervention (pci-s) N/A 11/01/2011    Procedure: PERCUTANEOUS CORONARY STENT INTERVENTION (PCI-S);  Surgeon: Lorretta Harp, MD;  Location: [facility] CATH LAB;  Service: Cardiovascular;  Laterality: N/A;  . Left heart catheterization with coronary/graft angiogram N/A 03/10/2012    Procedure: LEFT HEART CATHETERIZATION WITH [name];  Surgeon: Sherren Mocha, MD;  Location: [facility] CATH LAB;  Service: Cardiovascular;  Laterality: N/A;  . Left heart catheterization with coronary angiogram N/A 06/18/2012    Procedure: LEFT HEART CATHETERIZATION WITH CORONARY ANGIOGRAM;  Surgeon: Peter M Martinique, MD;  Location: [facility] CATH LAB;  Service: Cardiovascular;  Laterality: N/A;  . Percutaneous coronary stent intervention (pci-s)  06/18/2012    Procedure: PERCUTANEOUS CORONARY STENT INTERVENTION (PCI-S);  Surgeon: Peter M Martinique, MD;  Location: [facility] CATH LAB;  Service: Cardiovascular;;  . Left heart catheterization with coronary/graft angiogram  10/06/2012    Procedure: LEFT HEART CATHETERIZATION WITH [name];  Surgeon: Burnell Blanks, MD;  Location: [facility] CATH LAB;  Service: Cardiovascular;;  . Percutaneous coronary stent intervention (pci-s)  10/06/2012    Procedure: PERCUTANEOUS CORONARY STENT INTERVENTION (PCI-S);  Surgeon: Burnell Blanks, MD;  Location: [facility] CATH LAB;  Service: Cardiovascular;;  .  Left heart catheterization with coronary/graft angiogram N/A 11/09/2012    Procedure: LEFT HEART CATHETERIZATION WITH [name];  Surgeon: Peter M Martinique, MD;  Location: [facility] CATH LAB;  Service: Cardiovascular;  Laterality: N/A;  . Left heart catheterization with coronary/graft angiogram N/A 05/19/2014    Procedure: LEFT HEART CATHETERIZATION WITH  [name];  Surgeon: Troy Sine, MD;  Location: [facility] CATH LAB;  Service: Cardiovascular;  Laterality: N/A;   Family History  Problem Relation Age of Onset  . Early death      Parents died young  . Appendicitis Mother     Pt was 81 year old  . Heart attack Father 27   Social History  Substance Use Topics  . Smoking status: Former Smoker -- 2.00 packs/day for 10 years    Types: Cigarettes    Start date: 07/14/1950    Quit date: 07/14/1961  . Smokeless tobacco: Current User    Types: Chew  . Alcohol Use: No    Review of Systems  Constitutional: Positive for fatigue. Negative for fever.  HENT: Negative for congestion.   Eyes: Negative for visual disturbance.  Respiratory: Negative for shortness of breath.   Cardiovascular: Negative for chest pain.  Gastrointestinal: Positive for abdominal pain and constipation. Negative for nausea and vomiting.  Genitourinary: Negative for dysuria.  Musculoskeletal: Negative for back pain.  Skin: Negative for rash.  Neurological: Negative for headaches.  Hematological: Does not bruise/bleed easily.  Psychiatric/Behavioral: Negative for confusion.      Allergies  Review of patient's allergies indicates no known allergies.  Home Medications   Prior to Admission medications   Medication Sig Start Date End Date Taking? Authorizing Provider  acetaminophen (TYLENOL) 325 MG tablet Take 2 tablets (650 mg total) by mouth every 4 (four) hours as needed for headache or mild pain. Patient taking differently: Take 325 mg by mouth 2 (two) times daily as needed for headache or mild pain.  05/21/14  Yes Luke K Kilroy, PA-C  albuterol (PROVENTIL HFA;VENTOLIN HFA) 108 (90 BASE) MCG/ACT inhaler Inhale 2 puffs into the lungs every 6 (six) hours as needed for wheezing or shortness of breath.   Yes Historical Provider, MD  aspirin EC 81 MG tablet Take 81 mg by mouth daily.   Yes Historical Provider, MD  atorvastatin (LIPITOR) 40 MG tablet Take 1  tablet (40 mg total) by mouth daily at 6 PM. 05/29/13  Yes Benjie Ricketson T Kathlen Mody, PA-C  bismuth subsalicylate (PEPTO BISMOL) 262 MG/15ML suspension Take 30 mLs by mouth every 6 (six) hours as needed for indigestion or diarrhea or loose stools.    Yes Historical Provider, MD  BRILINTA 90 MG TABS tablet TAKE 1 TABLET BY MOUTH TWICE DAILY. 01/16/15  Yes Herminio Commons, MD  carvedilol (COREG) 3.125 MG tablet TAKE 1 TABLET BY MOUTH TWICE DAILY WITH MEALS. (HEART RATE & BLOOD PRESSURE) 03/05/15  Yes Lendon Colonel, NP  Chlorphen-Pseudoephed-APAP (RA SINUS TABLETS EX ST PO) Take 1 tablet by mouth as needed (for sinus).   Yes Historical Provider, MD  famotidine (PEPCID) 20 MG tablet Take 1 tablet (20 mg total) by mouth at bedtime. 03/03/15  Yes Rexene Alberts, MD  furosemide (LASIX) 20 MG tablet Take 20 mg by mouth as needed for fluid. Take 1 tablet as needed if 3 lb weight gain. 05/06/13  Yes Lendon Colonel, NP  isosorbide mononitrate (IMDUR) 30 MG 24 hr tablet Take 1.5 tablets (45 mg total) by mouth daily. 03/03/15  Yes Rexene Alberts, MD  levothyroxine (  SYNTHROID, LEVOTHROID) 50 MCG tablet Take 50 mcg by mouth daily before breakfast.  09/08/14  Yes Historical Provider, MD  loperamide (IMODIUM) 2 MG capsule Take 1 capsule (2 mg total) by mouth 4 (four) times daily as needed for diarrhea or loose stools. 04/01/15  Yes Kristen N Ward, DO  metFORMIN (GLUCOPHAGE) 500 MG tablet Take 2 tablets (1,000 mg total) by mouth 2 (two) times daily with a meal. RESTART ON Sunday, 03/04/15. Patient taking differently: Take 1,000 mg by mouth 2 (two) times daily with a meal.  03/03/15  Yes Rexene Alberts, MD  Multiple Vitamin (MULTIVITAMIN WITH MINERALS) TABS Take 1 tablet by mouth daily.   Yes Historical Provider, MD  nitroGLYCERIN (NITROSTAT) 0.4 MG SL tablet Place 0.4 mg under the tongue every 5 (five) minutes as needed for chest pain.   Yes Historical Provider, MD  potassium chloride (K-DUR) 10 MEQ tablet Take 10 mEq by mouth as  needed. Takes along with the Furosemide when he has swelling.   Yes Historical Provider, MD  tamsulosin (FLOMAX) 0.4 MG CAPS Take 0.4 mg by mouth daily after supper.  01/27/13  Yes Historical Provider, MD   BP 161/88 mmHg  Pulse 76  Temp(Src) 98.1 F (36.7 C) (Oral)  Resp 20  Ht 5\' 10"  (1.778 m)  Wt 170 lb (77.111 kg)  BMI 24.39 kg/m2  SpO2 97% Physical Exam  Constitutional: He is oriented to person, place, and time. He appears well-developed and well-nourished. No distress.  HENT:  Head: Normocephalic and atraumatic.  Mucous membranes slightly dry.  Eyes: Conjunctivae and EOM are normal. Pupils are equal, round, and reactive to light.  Neck: Normal range of motion. Neck supple.  Cardiovascular: Normal rate, regular rhythm and normal heart sounds.   No murmur heard. Pulmonary/Chest: Effort normal and breath sounds normal. No respiratory distress.  Abdominal: Soft. Bowel sounds are normal. There is no tenderness.  Musculoskeletal: Normal range of motion. He exhibits no edema or tenderness.  Neurological: He is alert and oriented to person, place, and time. No cranial nerve deficit. He exhibits normal muscle tone. Coordination normal.  Skin: Skin is warm. No rash noted.  Nursing note and vitals reviewed.   ED Course  Procedures (including critical care time) Labs Review Labs Reviewed  COMPREHENSIVE METABOLIC PANEL - Abnormal; Notable for the following:    Glucose, Bld 222 (*)    Total Protein 6.3 (*)    All other components within normal limits  CBC - Abnormal; Notable for the following:    RBC 4.04 (*)    Hemoglobin 12.8 (*)    HCT 38.4 (*)    All other components within normal limits  URINALYSIS, ROUTINE W REFLEX MICROSCOPIC (NOT AT [facility]) - Abnormal; Notable for the following:    Glucose, UA 250 (*)    Ketones, ur TRACE (*)    All other components within normal limits  I-STAT CG4 LACTIC ACID, ED - Abnormal; Notable for the following:    Lactic Acid, Venous 3.54 (*)     All other components within normal limits  LIPASE, BLOOD  I-STAT TROPOININ, ED  I-STAT CG4 LACTIC ACID, ED  I-STAT CG4 LACTIC ACID, ED  I-STAT CG4 LACTIC ACID, ED    chest x-ray without any acute findings. Results for orders placed or performed during the hospital encounter of 05/04/15  Lipase, blood  Result Value Ref Range   Lipase 23 11 - 51 U/L  Comprehensive metabolic panel  Result Value Ref Range   Sodium 136 135 -  145 mmol/L   Potassium 4.0 3.5 - 5.1 mmol/L   Chloride 103 101 - 111 mmol/L   CO2 24 22 - 32 mmol/L   Glucose, Bld 222 (H) 65 - 99 mg/dL   BUN 15 6 - 20 mg/dL   Creatinine, Ser 0.85 0.61 - 1.24 mg/dL   Calcium 9.2 8.9 - 10.3 mg/dL   Total Protein 6.3 (L) 6.5 - 8.1 g/dL   Albumin 3.7 3.5 - 5.0 g/dL   AST 29 15 - 41 U/L   ALT 23 17 - 63 U/L   Alkaline Phosphatase 73 38 - 126 U/L   Total Bilirubin 0.4 0.3 - 1.2 mg/dL   GFR calc non Af Amer >60 >60 mL/min   GFR calc Af Amer >60 >60 mL/min   Anion gap 9 5 - 15  CBC  Result Value Ref Range   WBC 6.2 4.0 - 10.5 K/uL   RBC 4.04 (L) 4.22 - 5.81 MIL/uL   Hemoglobin 12.8 (L) 13.0 - 17.0 g/dL   HCT 38.4 (L) 39.0 - 52.0 %   MCV 95.0 78.0 - 100.0 fL   MCH 31.7 26.0 - 34.0 pg   MCHC 33.3 30.0 - 36.0 g/dL   RDW 13.1 11.5 - 15.5 %   Platelets 156 150 - 400 K/uL  Urinalysis, Routine w reflex microscopic (not at [facility])  Result Value Ref Range   Color, Urine YELLOW YELLOW   APPearance CLEAR CLEAR   Specific Gravity, Urine 1.010 1.005 - 1.030   pH 7.0 5.0 - 8.0   Glucose, UA 250 (A) NEGATIVE mg/dL   Hgb urine dipstick NEGATIVE NEGATIVE   Bilirubin Urine NEGATIVE NEGATIVE   Ketones, ur TRACE (A) NEGATIVE mg/dL   Protein, ur NEGATIVE NEGATIVE mg/dL   Urobilinogen, UA 0.2 0.0 - 1.0 mg/dL   Nitrite NEGATIVE NEGATIVE   Leukocytes, UA NEGATIVE NEGATIVE  I-Stat Troponin, ED (not at [facility])  Result Value Ref Range   Troponin i, poc 0.01 0.00 - 0.08 ng/mL   Comment 3          I-Stat CG4 Lactic Acid, ED  Result Value Ref  Range   Lactic Acid, Venous 3.54 (HH) 0.5 - 2.0 mmol/L   Comment NOTIFIED PHYSICIAN   I-Stat CG4 Lactic Acid, ED  Result Value Ref Range   Lactic Acid, Venous 1.21 0.5 - 2.0 mmol/L  I-Stat CG4 Lactic Acid, ED  Result Value Ref Range   Lactic Acid, Venous 0.85 0.5 - 2.0 mmol/L    patient's troponin was normal. Urinalysis was negative for urinary tract infection no significant anemia no leukocytosis. No significant which light abnormalities. Patient's blood sugar was elevated he is a known diabetic no evidence of any metabolic acidosis associated with that. Imaging Review Ct Abdomen Pelvis W Contrast  05/04/2015  CLINICAL DATA:  Abdominal pain, constipation, last bowel movement 3 days ago, diabetes mellitus, stroke, essential benign hypertension, coronary artery disease, hyperlipidemia, GERD, history peptic ulcer disease EXAM: CT ABDOMEN AND PELVIS WITH CONTRAST TECHNIQUE: Multidetector CT imaging of the abdomen and pelvis was performed using the standard protocol following bolus administration of intravenous contrast. Sagittal and coronal MPR images reconstructed from axial data set. CONTRAST:  67mL OMNIPAQUE IOHEXOL 300 MG/ML SOLN, 120mL OMNIPAQUE IOHEXOL 300 MG/ML SOLN COMPARISON:  03/02/2015 FINDINGS: Minimal bibasilar atelectasis. Scattered atherosclerotic calcifications aorta and coronary arteries with postsurgical changes of median sternotomy. Liver, gallbladder, spleen, pancreas, kidneys, and adrenal glands normal appearance. Normal appendix. Minimal sigmoid diverticulosis. Stomach and remaining bowel loops normal appearance. Specifically, no  overt abnormal retained stool burden identified. Normal appearing bladder and ureters. Marked prostatic enlargement, gland 6.8 x 6.8 x 7.5 cm image 83. No mass, adenopathy, free air, free fluid or inflammatory process. Old fractures RIGHT lateral eighth and ninth ribs. Degenerative disc disease changes thoracolumbar spine. IMPRESSION: No acute  intra-abdominal or intrapelvic abnormalities. Minimal sigmoid diverticulosis. Scattered atherosclerotic disease. Marked prostatic enlargement. Electronically Signed   By: Lavonia Dana M.D.   On: 05/04/2015 19:38   Dg Abd Acute W/chest  05/04/2015  CLINICAL DATA:  Generalized weakness since waking this morning. EXAM: DG ABDOMEN ACUTE W/ 1V CHEST COMPARISON:  05/02/2015 FINDINGS: Prior CABG. Heart and mediastinal contours are within normal limits. No focal opacities or effusions. No acute bony abnormality. Large stool burden throughout the colon. Nonobstructive bowel gas pattern. No free air organomegaly. Mild leftward scoliosis in the lumbar spine. No acute bony abnormality. IMPRESSION: Large stool burden throughout the colon. No obstruction or free air. No acute cardiopulmonary disease. Electronically Signed   By: Rolm Baptise M.D.   On: 05/04/2015 16:39   I have personally reviewed and evaluated these images and lab results as part of my medical decision-making.   EKG Interpretation   Date/Time:  Friday May 04 2015 13:59:28 EDT Ventricular Rate:  76 PR Interval:  202 QRS Duration: 110 QT Interval:  420 QTC Calculation: 472 R Axis:   90 Text Interpretation:  Sinus rhythm Abnormal R-wave progression, early  transition Probable inferior infarct, age indeterminate Lateral leads are  also involved No significant change since last tracing Confirmed by  Telesforo Brosnahan  MD, Rieley Hausman [phone]) on 05/04/2015 2:03:10 PM      MDM   Final diagnoses:  Other fatigue  Generalized abdominal pain     patient well known to me. Frequently comes in for concerns for chest pain. That is not the case today. Patient was seen October 19 for chest pain without any significant findings. Today he is concerned about generalized weakness since getting up this morning and noted that his blood pressure was running low he was getting systolic numbers in the 19F and 100 to normal he's more around 140.   Workup showed  evidence of an elevated lactic acid. Initial blood pressures were as low as 790 systolic. Never was tachycardic does not have a fever. No evidence of any infectious source. With hydration lactic acid went back to normal. And patient's blood pressure is now up to 161/88. CT scan was done because of the elevated lactic acid and the generalized abdominal pain to rule out the possibility of ischemia. CT scan was negative. Patient also was concerned about having constipation. No evidence of that. EKG without any acute changes.   patient feeling much better and is stable for discharge home.    Fredia Sorrow, MD 05/04/15 2409  Fredia Sorrow, MD 05/04/15 2027

## 2015-05-06 ENCOUNTER — Emergency Department (HOSPITAL_COMMUNITY): Payer: Medicare Other

## 2015-05-06 ENCOUNTER — Emergency Department (HOSPITAL_COMMUNITY)
Admission: EM | Admit: 2015-05-06 | Discharge: 2015-05-06 | Disposition: A | Discharge status: Home / Self Care | Payer: Medicare Other | Attending: Emergency Medicine | Admitting: Emergency Medicine

## 2015-05-06 ENCOUNTER — Encounter (HOSPITAL_COMMUNITY): Payer: Self-pay | Admitting: Emergency Medicine

## 2015-05-06 DIAGNOSIS — G8929 Other chronic pain: Secondary | ICD-10-CM | POA: Diagnosis not present

## 2015-05-06 DIAGNOSIS — Z8711 Personal history of peptic ulcer disease: Secondary | ICD-10-CM | POA: Diagnosis not present

## 2015-05-06 DIAGNOSIS — M1990 Unspecified osteoarthritis, unspecified site: Secondary | ICD-10-CM | POA: Diagnosis not present

## 2015-05-06 DIAGNOSIS — I10 Essential (primary) hypertension: Secondary | ICD-10-CM | POA: Insufficient documentation

## 2015-05-06 DIAGNOSIS — E119 Type 2 diabetes mellitus without complications: Secondary | ICD-10-CM | POA: Diagnosis not present

## 2015-05-06 DIAGNOSIS — Z8781 Personal history of (healed) traumatic fracture: Secondary | ICD-10-CM | POA: Insufficient documentation

## 2015-05-06 DIAGNOSIS — L905 Scar conditions and fibrosis of skin: Secondary | ICD-10-CM | POA: Diagnosis not present

## 2015-05-06 DIAGNOSIS — Z9889 Other specified postprocedural states: Secondary | ICD-10-CM | POA: Insufficient documentation

## 2015-05-06 DIAGNOSIS — Z9861 Coronary angioplasty status: Secondary | ICD-10-CM | POA: Diagnosis not present

## 2015-05-06 DIAGNOSIS — R079 Chest pain, unspecified: Secondary | ICD-10-CM | POA: Insufficient documentation

## 2015-05-06 DIAGNOSIS — Z79899 Other long term (current) drug therapy: Secondary | ICD-10-CM | POA: Insufficient documentation

## 2015-05-06 DIAGNOSIS — I2510 Atherosclerotic heart disease of native coronary artery without angina pectoris: Secondary | ICD-10-CM | POA: Diagnosis not present

## 2015-05-06 DIAGNOSIS — Z8673 Personal history of transient ischemic attack (TIA), and cerebral infarction without residual deficits: Secondary | ICD-10-CM | POA: Diagnosis not present

## 2015-05-06 DIAGNOSIS — Z951 Presence of aortocoronary bypass graft: Secondary | ICD-10-CM | POA: Insufficient documentation

## 2015-05-06 DIAGNOSIS — E785 Hyperlipidemia, unspecified: Secondary | ICD-10-CM | POA: Diagnosis not present

## 2015-05-06 DIAGNOSIS — Z7982 Long term (current) use of aspirin: Secondary | ICD-10-CM | POA: Diagnosis not present

## 2015-05-06 DIAGNOSIS — Z8701 Personal history of pneumonia (recurrent): Secondary | ICD-10-CM | POA: Diagnosis not present

## 2015-05-06 DIAGNOSIS — Z87891 Personal history of nicotine dependence: Secondary | ICD-10-CM | POA: Insufficient documentation

## 2015-05-06 DIAGNOSIS — E039 Hypothyroidism, unspecified: Secondary | ICD-10-CM | POA: Insufficient documentation

## 2015-05-06 DIAGNOSIS — N400 Enlarged prostate without lower urinary tract symptoms: Secondary | ICD-10-CM | POA: Insufficient documentation

## 2015-05-06 LAB — URINALYSIS, ROUTINE W REFLEX MICROSCOPIC
BILIRUBIN URINE: NEGATIVE
Glucose, UA: 250 mg/dL — AB
HGB URINE DIPSTICK: NEGATIVE
KETONES UR: NEGATIVE mg/dL
Leukocytes, UA: NEGATIVE
NITRITE: NEGATIVE
PROTEIN: NEGATIVE mg/dL
SPECIFIC GRAVITY, URINE: 1.015 (ref 1.005–1.030)
UROBILINOGEN UA: 0.2 mg/dL (ref 0.0–1.0)
pH: 7 (ref 5.0–8.0)

## 2015-05-06 LAB — BASIC METABOLIC PANEL
ANION GAP: 11 (ref 5–15)
BUN: 13 mg/dL (ref 6–20)
CALCIUM: 9.3 mg/dL (ref 8.9–10.3)
CO2: 23 mmol/L (ref 22–32)
CREATININE: 0.92 mg/dL (ref 0.61–1.24)
Chloride: 105 mmol/L (ref 101–111)
GFR calc Af Amer: >60 mL/min (ref >60)
GLUCOSE: 173 mg/dL — AB (ref 65–99)
Potassium: 4.2 mmol/L (ref 3.5–5.1)
Sodium: 139 mmol/L (ref 135–145)

## 2015-05-06 LAB — CBC
HCT: 38.7 % — ABNORMAL LOW (ref 39.0–52.0)
HEMOGLOBIN: 13.1 g/dL (ref 13.0–17.0)
MCH: 32.1 pg (ref 26.0–34.0)
MCHC: 33.9 g/dL (ref 30.0–36.0)
MCV: 94.9 fL (ref 78.0–100.0)
Platelets: 157 10*3/uL (ref 150–400)
RBC: 4.08 MIL/uL — ABNORMAL LOW (ref 4.22–5.81)
RDW: 13 % (ref 11.5–15.5)
WBC: 7.4 10*3/uL (ref 4.0–10.5)

## 2015-05-06 LAB — I-STAT TROPONIN, ED
TROPONIN I, POC: 0.02 ng/mL (ref 0.00–0.08)
Troponin i, poc: 0.02 ng/mL (ref 0.00–0.08)

## 2015-05-06 LAB — POC OCCULT BLOOD, ED: FECAL OCCULT BLD: NEGATIVE

## 2015-05-06 LAB — TROPONIN I

## 2015-05-06 MED ORDER — SODIUM CHLORIDE 0.9 % IV BOLUS (SEPSIS)
500.0000 mL | Freq: Once | INTRAVENOUS | Status: AC
  Administered 2015-05-06: 500 mL via INTRAVENOUS

## 2015-05-06 MED ORDER — ASPIRIN 81 MG PO CHEW
324.0000 mg | CHEWABLE_TABLET | Freq: Once | ORAL | Status: AC
  Administered 2015-05-06: 324 mg via ORAL
  Filled 2015-05-06: qty 4

## 2015-05-06 NOTE — ED Provider Notes (Signed)
81 year old male, known history of coronary disease, multiple heart catheterizations in the past with known disease, recommended medical therapy as of November 2015 at his latest heart catheterization. He presents after having a short spell of chest pain earlier today. It is now resolved, he has no symptoms in his chest, he has had about 5 days of lower abdominal fullness and discomfort which was evaluated 2 days ago by CT scan which was unremarkable. There is no signs of infection, abscess, appendicitis or diverticulitis. The patient does not have any dysuria but has had dark colored urine and of note did get IV fluids for dehydration 2 days ago. On my exam he has clear heart and lung sounds, no wheezing, no distress, no murmurs, soft abdomen, mild tenderness in the suprapubic region. There is no guarding, no focal peritoneal signs, no peripheral edema. Labs reviewed, chest x-ray reviewed, the patient will need prostate exam, rule out prostatitis, otherwise the patient appears stable, second troponin and EKG will be ordered and anticipate discharge unless abnormal findings occur.   EKG Interpretation  Date/Time:  Sunday May 06 2015 12:47:37 EDT Ventricular Rate:  90 PR Interval:  176 QRS Duration: 106 QT Interval:  396 QTC Calculation: 484 R Axis:   87 Text Interpretation:  Normal sinus rhythm ST \\T \ T wave abnormality, consider inferolateral ischemia Prolonged QT Abnormal ECG since last tracing no significant change Confirmed by Sabra Heck  MD, [place] ([id number]) on 05/06/2015 12:55:43 PM     '  EKG Interpretation  Date/Time:  Sunday May 06 2015 16:18:08 EDT Ventricular Rate:  81 PR Interval:  186 QRS Duration: 115 QT Interval:  444 QTC Calculation: 515 R Axis:   91 Text Interpretation:  Sinus rhythm Nonspecific intraventricular conduction delay Probable inferior infarct, age indeterminate Lateral leads are also involved since last tracing no significant change Confirmed by Sabra Heck  MD, [place]  [phone]) on 05/06/2015 4:31:14 PM        Medical screening examination/treatment/procedure(s) were conducted as a shared visit with non-physician practitioner(s) and myself.  I personally evaluated the patient during the encounter.  Clinical Impression:   Final diagnoses:  Chest pain, unspecified chest pain type          Noemi Chapel, MD 05/08/15 [phone]

## 2015-05-06 NOTE — ED Notes (Signed)
Patient complaining of chest pain starting approximately 45 minutes ago. Denies other symptoms.

## 2015-05-06 NOTE — Discharge Instructions (Signed)
You are advised to take your medications as prescribed. You have an appointment with your cardiologist for tomorrow, please keep this appt and tell her of your troubles with your chest pain.  Follow up with PCP as needed. Return to ED should symptoms return and are not relieved by nitroglycerin.   Cardiac-Specific Troponin I and T Test WHY AM I HAVING THIS TEST? You may have this test if you have experienced chest pain. The test can be used to determine if you have had a heart attack or injury to heart (cardiac) muscle. This test can also help predict the possibility of future heart attacks. This test measures the concentration of cardiac-specific troponin in your blood. Troponins are proteins that help muscles contract. There are three forms of troponin, including troponins C, I, and T. The types of troponins I and T that are found in cardiac muscle are different from the troponins I and T that are found in skeletal muscle. Therefore, testing can be done for cardiac-specific troponins I and T. These types of troponin are normally present in very small quantities in the blood. When there is damage to heart muscle cells, cardiac troponins I and T are released into circulation. The more damage there is, the greater the concentration of troponins I and T. When a person has a heart attack, levels of troponin can become elevated in the blood within 3-4 hours after injury and may remain elevated for 10-14 days. WHAT KIND OF SAMPLE IS TAKEN? A blood sample is required for this test. It is usually collected by inserting a needle into a vein. Usually, an initial blood sample is collected, and then another blood sample is collected 12 hours later. After these samples, you will have your blood tested daily for 3-5 days. You might also have it tested weekly for 5-6 weeks. HOW DO I PREPARE FOR THE TEST? There is no preparation required for this test. However, be aware that you will need to make arrangements to have  your blood collected frequently.  WHAT ARE THE REFERENCE RANGES? Reference values are considered healthy values established after testing a large group of healthy people. Reference values may vary among different people, labs, and hospitals. It is your responsibility to obtain your test results. Ask the lab or department performing the test when and how you will get your results. Reference values for cardiac troponins are as follows:  Cardiac troponin T: less than 0.1 ng/mL.  Cardiac troponin I: less than 0.03 ng/mL. WHAT DO THE RESULTS MEAN? Troponin values above the reference values may indicate:  Injury to the heart muscle.  Heart attack. Talk with your health care provider to discuss your results, treatment options, and if necessary, the need for more tests. Talk with your health care provider if you have any questions about your results.   This information is not intended to replace advice given to you by your health care provider. Make sure you discuss any questions you have with your health care provider.   Document Released: 08/02/2004 Document Revised: 07/21/2014 Document Reviewed: 11/23/2013 Elsevier Interactive Patient Education 2016 [employer].  Aspirin and Your Heart  Aspirin is a medicine that affects the way blood clots. Aspirin can be used to help reduce the risk of blood clots, heart attacks, and other heart-related problems.  SHOULD I TAKE ASPIRIN? Your health care provider will help you determine whether it is safe and beneficial for you to take aspirin daily. Taking aspirin daily may be beneficial if you:  Have  had a heart attack or chest pain.  Have undergone open heart surgery such as coronary artery bypass surgery (CABG).  Have had coronary angioplasty.  Have experienced a stroke or transient ischemic attack (TIA).  Have peripheral vascular disease (PVD).  Have chronic heart rhythm problems such as atrial fibrillation. ARE THERE ANY RISKS OF TAKING  ASPIRIN DAILY? Daily use of aspirin can increase your risk of side effects. Some of these include:  Bleeding. Bleeding problems can be minor or serious. An example of a minor problem is a cut that does not stop bleeding. An example of a more serious problem is stomach bleeding or bleeding into the brain. Your risk of bleeding is increased if you are also taking non-steroidal anti-inflammatory medicine (NSAIDs).  Increased bruising.  Upset stomach.  An allergic reaction. People who have nasal polyps have an increased risk of developing an aspirin allergy. WHAT ARE SOME GUIDELINES I SHOULD FOLLOW WHEN TAKING ASPIRIN?   Take aspirin only as directed by your health care provider. Make sure you understand how much you should take and what form you should take. The two forms of aspirin are:  Non-enteric-coated. This type of aspirin does not have a coating and is absorbed quickly. Non-enteric-coated aspirin is usually recommended for people with chest pain. This type of aspirin also comes in a chewable form.  Enteric-coated. This type of aspirin has a special coating that releases the medicine very slowly. Enteric-coated aspirin causes less stomach upset than non-enteric-coated aspirin. This type of aspirin should not be chewed or crushed.  Drink alcohol in moderation. Drinking alcohol increases your risk of bleeding. WHEN SHOULD I SEEK MEDICAL CARE?   You have unusual bleeding or bruising.  You have stomach pain.  You have an allergic reaction. Symptoms of an allergic reaction include:  Hives.  Itchy skin.  Swelling of the lips, tongue, or face.  You have ringing in your ears. WHEN SHOULD I SEEK IMMEDIATE MEDICAL CARE?   Your bowel movements are bloody, dark red, or black in color.  You vomit or cough up blood.  You have blood in your urine.  You cough, wheeze, or feel short of breath. If you have any of the following symptoms, this is an emergency. Do not wait to see if the pain  will go away. Get medical help at once. Call your local emergency services (911 in the [place] Do not drive yourself to the hospital.  You have severe chest pain, especially if the pain is crushing or pressure-like and spreads to the arms, back, neck, or jaw.  You have stroke-like symptoms, such as:   Loss of vision.   Difficulty talking.   Numbness or weakness on one side of your body.   Numbness or weakness in your arm or leg.   Not thinking clearly or feeling confused.    This information is not intended to replace advice given to you by your health care provider. Make sure you discuss any questions you have with your health care provider.   Document Released: 06/12/2008 Document Revised: 07/21/2014 Document Reviewed: 10/05/2013 Elsevier Interactive Patient Education [employer].

## 2015-05-06 NOTE — ED Provider Notes (Signed)
CSN: [id number]     Arrival date & time 05/06/15  1237 History   First MD Initiated Contact with Patient 05/06/15 1458     Chief Complaint  Patient presents with  . Chest Pain     (Consider location/radiation/quality/duration/timing/severity/associated sxs/prior Treatment) HPI   Jeffrey [name] is a 81 y.o. male, pt with history of CABG, HTN, DM, CVA, presents with complaint of chest pain in the center of his chest beginning around 11am this morning, rated 8/10, sharp in nature, non-radiating. Pt was lying down flat at onset. Pain has resolved upon provider interview. Denies diaphoresis, fever/chills, N/V/D, shortness of breath, cough, or recent illness. Constipation since this morning. Took rolaids and this did not help the pain.  Has not had his ASA today. Has not taken NTG. Pt states this feels different than any other chest pain he has had and does not feel like his GERD.    Past Medical History  Diagnosis Date  . Diabetes mellitus, type II ([place])   . Hyperlipidemia   . Coronary atherosclerosis of native coronary artery     a. CABG x 4 in 1989 (VG->OM1->OM2, VG->RCA, LIMA->LAD), b. 05/2010: DES to VG-OM1/OM2, DES to distal LCx. c. NSTEMI in 04/2011 - TO distal LCX stent and VG->OM2. d. 02/2012 NSTEMI DES to VG-OM1/continuation to OM2 occluded. e. inferior STEMI s/p DES to SVG-RAMUS 06/2012. f. inferolat STEMI 09/2012 s/p DES to SVG-interm; g. Lex MV (11/14):  EF 35%, inf-lat scar with small peri-infarct ischemia  . Essential hypertension, benign   . Osteoarthritis   . History of stroke   . History of pneumonia   . Cervical vertebral fracture ([place])   . Chronic back pain   . Benign prostatic hypertrophy     History of urinary retention  . Peptic ulcer disease   . Gastroesophageal reflux disease   . Ischemic cardiomyopathy Nov 2015    EF 35% cath, 45-50% by echo  . Hypothyroidism   . Anxiety about health     "multiple somatic complaints"   Past Surgical History  Procedure  Laterality Date  . Tonsillectomy    . Coronary artery bypass graft  1989  . Coronary angioplasty  10/12, 8/13, 12/13, 3/14    SVG-OM PCI  . Cardiac catheterization  05/19/14    SVG-OM occl- medical Rx  . Left heart catheterization with coronary angiogram N/A 11/01/2011    Procedure: LEFT HEART CATHETERIZATION WITH CORONARY ANGIOGRAM;  Surgeon: Lorretta Harp, MD;  Location: [facility] CATH LAB;  Service: Cardiovascular;  Laterality: N/A;  . Percutaneous coronary stent intervention (pci-s) N/A 11/01/2011    Procedure: PERCUTANEOUS CORONARY STENT INTERVENTION (PCI-S);  Surgeon: Lorretta Harp, MD;  Location: [facility] CATH LAB;  Service: Cardiovascular;  Laterality: N/A;  . Left heart catheterization with coronary/graft angiogram N/A 03/10/2012    Procedure: LEFT HEART CATHETERIZATION WITH [name];  Surgeon: Sherren Mocha, MD;  Location: [facility] CATH LAB;  Service: Cardiovascular;  Laterality: N/A;  . Left heart catheterization with coronary angiogram N/A 06/18/2012    Procedure: LEFT HEART CATHETERIZATION WITH CORONARY ANGIOGRAM;  Surgeon: Peter M Martinique, MD;  Location: [facility] CATH LAB;  Service: Cardiovascular;  Laterality: N/A;  . Percutaneous coronary stent intervention (pci-s)  06/18/2012    Procedure: PERCUTANEOUS CORONARY STENT INTERVENTION (PCI-S);  Surgeon: Peter M Martinique, MD;  Location: [facility] CATH LAB;  Service: Cardiovascular;;  . Left heart catheterization with coronary/graft angiogram  10/06/2012    Procedure: LEFT HEART CATHETERIZATION WITH [name];  Surgeon: Burnell Blanks, MD;  Location: [place] CATH LAB;  Service: Cardiovascular;;  . Percutaneous coronary stent intervention (pci-s)  10/06/2012    Procedure: PERCUTANEOUS CORONARY STENT INTERVENTION (PCI-S);  Surgeon: Burnell Blanks, MD;  Location: [facility] CATH LAB;  Service: Cardiovascular;;  . Left heart catheterization with coronary/graft angiogram N/A 11/09/2012    Procedure: LEFT HEART CATHETERIZATION WITH  [name];  Surgeon: Peter M Martinique, MD;  Location: [facility] CATH LAB;  Service: Cardiovascular;  Laterality: N/A;  . Left heart catheterization with coronary/graft angiogram N/A 05/19/2014    Procedure: LEFT HEART CATHETERIZATION WITH [name];  Surgeon: Troy Sine, MD;  Location: [facility] CATH LAB;  Service: Cardiovascular;  Laterality: N/A;   Family History  Problem Relation Age of Onset  . Early death      Parents died young  . Appendicitis Mother     Pt was 30 year old  . Heart attack Father 22   Social History  Substance Use Topics  . Smoking status: Former Smoker -- 2.00 packs/day for 10 years    Types: Cigarettes    Start date: 07/14/1950    Quit date: 07/14/1961  . Smokeless tobacco: Current User    Types: Chew  . Alcohol Use: No    Review of Systems    Allergies  Review of patient's allergies indicates no known allergies.  Home Medications   Prior to Admission medications   Medication Sig Start Date End Date Taking? Authorizing Provider  acetaminophen (TYLENOL) 325 MG tablet Take 2 tablets (650 mg total) by mouth every 4 (four) hours as needed for headache or mild pain. Patient taking differently: Take 325 mg by mouth 2 (two) times daily as needed for headache or mild pain.  05/21/14  Yes Luke K Kilroy, PA-C  albuterol (PROVENTIL HFA;VENTOLIN HFA) 108 (90 BASE) MCG/ACT inhaler Inhale 2 puffs into the lungs every 6 (six) hours as needed for wheezing or shortness of breath.   Yes Historical Provider, MD  aspirin EC 81 MG tablet Take 81 mg by mouth daily.   Yes Historical Provider, MD  atorvastatin (LIPITOR) 40 MG tablet Take 1 tablet (40 mg total) by mouth daily at 6 PM. 05/29/13  Yes Scott T Kathlen Mody, PA-C  bismuth subsalicylate (PEPTO BISMOL) 262 MG/15ML suspension Take 30 mLs by mouth every 6 (six) hours as needed for indigestion or diarrhea or loose stools.    Yes Historical Provider, MD  BRILINTA 90 MG TABS tablet TAKE 1 TABLET BY MOUTH TWICE  DAILY. 01/16/15  Yes Herminio Commons, MD  carvedilol (COREG) 3.125 MG tablet TAKE 1 TABLET BY MOUTH TWICE DAILY WITH MEALS. (HEART RATE & BLOOD PRESSURE) 03/05/15  Yes Lendon Colonel, NP  Chlorphen-Pseudoephed-APAP (RA SINUS TABLETS EX ST PO) Take 1 tablet by mouth as needed (for sinus).   Yes Historical Provider, MD  famotidine (PEPCID) 20 MG tablet Take 1 tablet (20 mg total) by mouth at bedtime. 03/03/15  Yes Rexene Alberts, MD  isosorbide mononitrate (IMDUR) 30 MG 24 hr tablet Take 1.5 tablets (45 mg total) by mouth daily. 03/03/15  Yes Rexene Alberts, MD  levothyroxine (SYNTHROID, LEVOTHROID) 50 MCG tablet Take 50 mcg by mouth daily before breakfast.  09/08/14  Yes Historical Provider, MD  loperamide (IMODIUM) 2 MG capsule Take 1 capsule (2 mg total) by mouth 4 (four) times daily as needed for diarrhea or loose stools. 04/01/15  Yes Kristen N Ward, DO  metFORMIN (GLUCOPHAGE) 500 MG tablet Take 2 tablets (1,000 mg total) by mouth 2 (two) times daily with a  meal. RESTART ON Sunday, 03/04/15. Patient taking differently: Take 1,000 mg by mouth 2 (two) times daily with a meal.  03/03/15  Yes Rexene Alberts, MD  Multiple Vitamin (MULTIVITAMIN WITH MINERALS) TABS Take 1 tablet by mouth daily.   Yes Historical Provider, MD  tamsulosin (FLOMAX) 0.4 MG CAPS Take 0.4 mg by mouth daily after supper.  01/27/13  Yes Historical Provider, MD  furosemide (LASIX) 20 MG tablet Take 20 mg by mouth as needed for fluid. Take 1 tablet as needed if 3 lb weight gain. 05/06/13   Lendon Colonel, NP  nitroGLYCERIN (NITROSTAT) 0.4 MG SL tablet Place 0.4 mg under the tongue every 5 (five) minutes as needed for chest pain.    Historical Provider, MD  potassium chloride (K-DUR) 10 MEQ tablet Take 10 mEq by mouth as needed. Takes along with the Furosemide when he has swelling.    Historical Provider, MD   BP 127/65 mmHg  Pulse 75  Temp(Src) 98.1 F (36.7 C) (Oral)  Resp 16  Ht 5\' 10"  (1.778 m)  Wt 170 lb (77.111 kg)  BMI  24.39 kg/m2  SpO2 100% Physical Exam  Constitutional: He appears well-developed and well-nourished. No distress.  HENT:  Head: Normocephalic and atraumatic.  Eyes: Conjunctivae are normal. Pupils are equal, round, and reactive to light.  Cardiovascular: Normal rate, regular rhythm, normal heart sounds and intact distal pulses.   Pulmonary/Chest: Effort normal and breath sounds normal. No respiratory distress. He exhibits no tenderness and no deformity.  Sternal scar present.  Abdominal: Soft. Bowel sounds are normal.  Genitourinary: Rectum normal. Prostate is enlarged. Prostate is not tender.  Musculoskeletal: He exhibits no edema or tenderness.  Lymphadenopathy:    He has no cervical adenopathy.  Neurological: He is alert.  Skin: Skin is warm and dry. He is not diaphoretic.  Nursing note and vitals reviewed.   ED Course  Procedures (including critical care time) Labs Review Labs Reviewed  BASIC METABOLIC PANEL - Abnormal; Notable for the following:    Glucose, Bld 173 (*)    All other components within normal limits  CBC - Abnormal; Notable for the following:    RBC 4.08 (*)    HCT 38.7 (*)    All other components within normal limits  URINALYSIS, ROUTINE W REFLEX MICROSCOPIC (NOT AT [facility]) - Abnormal; Notable for the following:    Glucose, UA 250 (*)    All other components within normal limits  URINE CULTURE  TROPONIN I  I-STAT TROPOININ, ED  POC OCCULT BLOOD, ED  Randolm Idol, ED    Imaging Review Dg Chest 2 View  05/06/2015  CLINICAL DATA:  Chest pain EXAM: CHEST  2 VIEW COMPARISON:  05/04/2015 FINDINGS: Lungs are clear.  No pleural effusion or pneumothorax. Heart is normal in size. Postsurgical changes related to prior CABG. Degenerative changes of the visualized thoracolumbar spine. IMPRESSION: No evidence of acute cardiopulmonary disease. Electronically Signed   By: Julian Hy M.D.   On: 05/06/2015 13:46   Ct Abdomen Pelvis W Contrast  05/04/2015   CLINICAL DATA:  Abdominal pain, constipation, last bowel movement 3 days ago, diabetes mellitus, stroke, essential benign hypertension, coronary artery disease, hyperlipidemia, GERD, history peptic ulcer disease EXAM: CT ABDOMEN AND PELVIS WITH CONTRAST TECHNIQUE: Multidetector CT imaging of the abdomen and pelvis was performed using the standard protocol following bolus administration of intravenous contrast. Sagittal and coronal MPR images reconstructed from axial data set. CONTRAST:  60mL OMNIPAQUE IOHEXOL 300 MG/ML SOLN, 177mL OMNIPAQUE IOHEXOL 300  MG/ML SOLN COMPARISON:  03/02/2015 FINDINGS: Minimal bibasilar atelectasis. Scattered atherosclerotic calcifications aorta and coronary arteries with postsurgical changes of median sternotomy. Liver, gallbladder, spleen, pancreas, kidneys, and adrenal glands normal appearance. Normal appendix. Minimal sigmoid diverticulosis. Stomach and remaining bowel loops normal appearance. Specifically, no overt abnormal retained stool burden identified. Normal appearing bladder and ureters. Marked prostatic enlargement, gland 6.8 x 6.8 x 7.5 cm image 83. No mass, adenopathy, free air, free fluid or inflammatory process. Old fractures RIGHT lateral eighth and ninth ribs. Degenerative disc disease changes thoracolumbar spine. IMPRESSION: No acute intra-abdominal or intrapelvic abnormalities. Minimal sigmoid diverticulosis. Scattered atherosclerotic disease. Marked prostatic enlargement. Electronically Signed   By: Lavonia Dana M.D.   On: 05/04/2015 19:38   I have personally reviewed and evaluated these images and lab results as part of my medical decision-making.   EKG Interpretation   Date/Time:  Sunday May 06 2015 16:18:08 EDT Ventricular Rate:  81 PR Interval:  186 QRS Duration: 115 QT Interval:  444 QTC Calculation: 515 R Axis:   91 Text Interpretation:  Sinus rhythm Nonspecific intraventricular conduction  delay Probable inferior infarct, age indeterminate  Lateral leads are also  involved since last tracing no significant change Confirmed by MILLER  MD,  [place] ([id number]) on 05/06/2015 4:31:14 PM      MDM   Final diagnoses:  Chest pain, unspecified chest pain type    Jeffrey [name] presents with chest pain that began today at around 11 am with no accompanying symptoms.   Findings and plan of care discussed with Noemi Chapel, MD.  Pt CXR shows no acute cardiopulmonary disease. Pt not currently in pain, will hold NTG and morphine. Ordered ASA. EKG shows NSR with some ST and T wave abnormalities, with no significant changes since previous EKG. HEART score is high for MACE, but last cardiac cath note states pt has a known graft occlusion with cardiologist recommendation of medical management. Pt currently on appropriate medical management. Plan to do serial troponins, serial EKGs, reassess, and discharge patient to follow up with Cardiologist as needed. CBC and BMP without significant abnormalities.  3:56 PM Pt now complains of bilateral lower abdominal pain, was seen for this on 10/21. CT showed no abscess, blockage, or diverticulitis. Pt has no dysuria, but states his urine is darker than normal. Note from 10/21 does not mention administration of IV fluids. Pt to receive 500cc NS bolus and PO fluids. Prostate/rectal exam indicated also.  Prostate exam reveals no gross blood, minimal dark stools (pt on iron supplement), and hemocult was negative. Prostate enlarged but non-tender.   Echo in August 2016 shows: - Left ventricle: The cavity size was normal. Wall thickness was normal. Systolic function was mildly reduced. The estimated ejection fraction was in the range of 45% to 50%. Diffuse hypokinesis. Doppler parameters are consistent with abnormal left ventricular relaxation (grade 1 diastolic dysfunction). - Aortic valve: Moderately calcified annulus. Trileaflet; severely thickened leaflets. Valve area (VTI): 1.63 cm^2. Valve  area (Vmax): 1.77 cm^2. - Mitral valve: Mildly calcified annulus. Mildly thickened leaflets . - Left atrium: The atrium was moderately dilated. - Right atrium: The atrium was mildly dilated. - Technically adequate study.  Lorayne Bender, PA-C 05/06/15 [place], MD 05/08/15 [phone]

## 2015-05-07 ENCOUNTER — Ambulatory Visit (INDEPENDENT_AMBULATORY_CARE_PROVIDER_SITE_OTHER): Payer: Medicare Other | Admitting: Adult Health

## 2015-05-07 ENCOUNTER — Encounter: Payer: Self-pay | Admitting: Adult Health

## 2015-05-07 VITALS — BP 160/74 | HR 91 | Ht 70.0 in | Wt 172.0 lb

## 2015-05-07 DIAGNOSIS — E0829 Diabetes mellitus due to underlying condition with other diabetic kidney complication: Secondary | ICD-10-CM | POA: Diagnosis not present

## 2015-05-07 DIAGNOSIS — I519 Heart disease, unspecified: Secondary | ICD-10-CM

## 2015-05-07 DIAGNOSIS — I10 Essential (primary) hypertension: Secondary | ICD-10-CM | POA: Diagnosis not present

## 2015-05-07 DIAGNOSIS — I2510 Atherosclerotic heart disease of native coronary artery without angina pectoris: Secondary | ICD-10-CM | POA: Diagnosis not present

## 2015-05-07 NOTE — Progress Notes (Signed)
Name: Jeffrey [name]    DOB: [birth year]  Age: 81 y.o.  MR#: [id number]       PCP:  [name]., MD      Insurance: Payor: MEDICARE / Plan: MEDICARE PART A AND B / Product Type: *No Product type* /   CC:   No chief complaint on file.   VS Filed Vitals:   05/07/15 1335  BP: 160/74  Pulse: 91  Height: 5\' 10"  (1.778 m)  Weight: 172 lb (78.019 kg)  SpO2: 98%    Weights Current Weight  05/07/15 172 lb (78.019 kg)  05/06/15 170 lb (77.111 kg)  05/04/15 170 lb (77.111 kg)    Blood Pressure  BP Readings from Last 3 Encounters:  05/07/15 160/74  05/06/15 151/81  05/04/15 113/62     Admit date:  (Not on file) Last encounter with RMR:  03/30/2015   Allergy Review of patient's allergies indicates no known allergies.  Current Outpatient Prescriptions  Medication Sig Dispense Refill  . acetaminophen (TYLENOL) 325 MG tablet Take 2 tablets (650 mg total) by mouth every 4 (four) hours as needed for headache or mild pain. (Patient taking differently: Take 325 mg by mouth 2 (two) times daily as needed for headache or mild pain. )    . albuterol (PROVENTIL HFA;VENTOLIN HFA) 108 (90 BASE) MCG/ACT inhaler Inhale 2 puffs into the lungs every 6 (six) hours as needed for wheezing or shortness of breath.    Marland Kitchen aspirin EC 81 MG tablet Take 81 mg by mouth daily.    Marland Kitchen atorvastatin (LIPITOR) 40 MG tablet Take 1 tablet (40 mg total) by mouth daily at 6 PM. 30 tablet 11  . bismuth subsalicylate (PEPTO BISMOL) 262 MG/15ML suspension Take 30 mLs by mouth every 6 (six) hours as needed for indigestion or diarrhea or loose stools.     Marland Kitchen BRILINTA 90 MG TABS tablet TAKE 1 TABLET BY MOUTH TWICE DAILY. 60 tablet 3  . carvedilol (COREG) 3.125 MG tablet TAKE 1 TABLET BY MOUTH TWICE DAILY WITH MEALS. (HEART RATE & BLOOD PRESSURE) 60 tablet 3  . Chlorphen-Pseudoephed-APAP (RA SINUS TABLETS EX ST PO) Take 1 tablet by mouth as needed (for sinus).    . famotidine (PEPCID) 20 MG tablet Take 1 tablet (20 mg total) by  mouth at bedtime. 30 tablet 3  . furosemide (LASIX) 20 MG tablet Take 20 mg by mouth as needed for fluid. Take 1 tablet as needed if 3 lb weight gain.    . isosorbide mononitrate (IMDUR) 30 MG 24 hr tablet Take 1.5 tablets (45 mg total) by mouth daily. 45 tablet 11  . levothyroxine (SYNTHROID, LEVOTHROID) 50 MCG tablet Take 50 mcg by mouth daily before breakfast.     . loperamide (IMODIUM) 2 MG capsule Take 1 capsule (2 mg total) by mouth 4 (four) times daily as needed for diarrhea or loose stools. 12 capsule 0  . metFORMIN (GLUCOPHAGE) 500 MG tablet Take 2 tablets (1,000 mg total) by mouth 2 (two) times daily with a meal. RESTART ON Sunday, 03/04/15. (Patient taking differently: Take 1,000 mg by mouth 2 (two) times daily with a meal. )    . Multiple Vitamin (MULTIVITAMIN WITH MINERALS) TABS Take 1 tablet by mouth daily.    . nitroGLYCERIN (NITROSTAT) 0.4 MG SL tablet Place 0.4 mg under the tongue every 5 (five) minutes as needed for chest pain.    . potassium chloride (K-DUR) 10 MEQ tablet Take 10 mEq by mouth as needed. Takes along with the  Furosemide when he has swelling.    . tamsulosin (FLOMAX) 0.4 MG CAPS Take 0.4 mg by mouth daily after supper.      No current facility-administered medications for this visit.    Discontinued Meds:   There are no discontinued medications.  Patient Active Problem List   Diagnosis Date Noted  . Atypical chest pain 03/02/2015  . Dyspepsia 03/02/2015  . Abdominal pain, epigastric 03/02/2015  . Essential hypertension 03/02/2015  . Angina at rest [facility]) 06/09/2014  . Abnormal ECG 06/09/2014  . Chest pain at rest 06/09/2014  . Dizziness 06/01/2014  . Unstable angina ([place]) 05/19/2014  . History of Clostridium difficile 11/05/2013  . NSTEMI (non-ST elevated myocardial infarction) ([place]) 08/29/2013  . Nausea & vomiting 08/27/2013  . Diarrhea 08/27/2013  . C. difficile diarrhea 08/27/2013  . Chest pain 06/16/2013  . Precordial chest pain 05/29/2013  .  Coronary atherosclerosis of native coronary artery 05/29/2013  . Noncompliance 11/22/2012  . Anemia, normocytic normochromic   . Cardiomyopathy, ischemic 06/21/2012  . Cerebrovascular disease 06/08/2012  . Diabetes mellitus, type II ([place])   . Hypertension   . Arteriosclerotic cardiovascular disease (ASCVD) 10/25/2011  . Hyperlipidemia 10/24/2011  . BPH (benign prostatic hyperplasia) 05/07/2011    LABS    Component Value Date/Time   NA 139 05/06/2015 1323   NA 136 05/04/2015 1330   NA 139 05/02/2015 1303   K 4.2 05/06/2015 1323   K 4.0 05/04/2015 1330   K 4.1 05/02/2015 1303   CL 105 05/06/2015 1323   CL 103 05/04/2015 1330   CL 103 05/02/2015 1303   CO2 23 05/06/2015 1323   CO2 24 05/04/2015 1330   CO2 26 04/27/2015 0659   GLUCOSE 173* 05/06/2015 1323   GLUCOSE 222* 05/04/2015 1330   GLUCOSE 202* 05/02/2015 1303   BUN 13 05/06/2015 1323   BUN 15 05/04/2015 1330   BUN 13 05/02/2015 1303   CREATININE 0.92 05/06/2015 1323   CREATININE 0.85 05/04/2015 1330   CREATININE 0.80 05/02/2015 1303   CREATININE 0.94 06/21/2014 1501   CREATININE 1.19 04/27/2013 1140   CREATININE 0.98 12/04/2011 1208   CALCIUM 9.3 05/06/2015 1323   CALCIUM 9.2 05/04/2015 1330   CALCIUM 9.3 04/27/2015 0659   GFRNONAA >60 05/06/2015 1323   GFRNONAA >60 05/04/2015 1330   GFRNONAA >60 04/27/2015 0659   GFRNONAA 75 06/21/2014 1501   GFRAA >60 05/06/2015 1323   GFRAA >60 05/04/2015 1330   GFRAA >60 04/27/2015 0659   GFRAA 86 06/21/2014 1501   CMP     Component Value Date/Time   NA 139 05/06/2015 1323   K 4.2 05/06/2015 1323   CL 105 05/06/2015 1323   CO2 23 05/06/2015 1323   GLUCOSE 173* 05/06/2015 1323   BUN 13 05/06/2015 1323   CREATININE 0.92 05/06/2015 1323   CREATININE 0.94 06/21/2014 1501   CALCIUM 9.3 05/06/2015 1323   PROT 6.3* 05/04/2015 1330   ALBUMIN 3.7 05/04/2015 1330   AST 29 05/04/2015 1330   ALT 23 05/04/2015 1330   ALKPHOS 73 05/04/2015 1330   BILITOT 0.4 05/04/2015  1330   GFRNONAA >60 05/06/2015 1323   GFRNONAA 75 06/21/2014 1501   GFRAA >60 05/06/2015 1323   GFRAA 86 06/21/2014 1501       Component Value Date/Time   WBC 7.4 05/06/2015 1323   WBC 6.2 05/04/2015 1330   WBC 7.5 05/02/2015 1230   HGB 13.1 05/06/2015 1323   HGB 12.8* 05/04/2015 1330   HGB 14.3 05/02/2015 1303  HCT 38.7* 05/06/2015 1323   HCT 38.4* 05/04/2015 1330   HCT 42.0 05/02/2015 1303   MCV 94.9 05/06/2015 1323   MCV 95.0 05/04/2015 1330   MCV 94.8 05/02/2015 1230    Lipid Panel     Component Value Date/Time   CHOL 190 05/29/2013 0410   TRIG 335* 05/29/2013 0410   HDL 33* 05/29/2013 0410   CHOLHDL 5.8 05/29/2013 0410   VLDL 67* 05/29/2013 0410   LDLCALC 90 05/29/2013 0410    ABG    Component Value Date/Time   TCO2 22 05/02/2015 1303     Lab Results  Component Value Date   TSH 0.372 03/02/2015   BNP (last 3 results)  Recent Labs  05/02/15 1230  BNP 217.0*    ProBNP (last 3 results) No results for input(s): PROBNP in the last 8760 hours.  Cardiac Panel (last 3 results)  Recent Labs  05/06/15 1323  TROPONINI <0.03    Iron/TIBC/Ferritin/ %Sat No results found for: IRON, TIBC, FERRITIN, IRONPCTSAT   EKG Orders placed or performed during the hospital encounter of 05/06/15  . EKG 12-Lead  . EKG 12-Lead  . ED EKG within 10 minutes  . ED EKG within 10 minutes  . ED EKG  . ED EKG  . EKG 12-Lead  . EKG 12-Lead     Prior Assessment and Plan Problem List as of 05/07/2015      Cardiovascular and Mediastinum   Arteriosclerotic cardiovascular disease (ASCVD)   Last Assessment & Plan 10/13/2013 Office Visit Written 10/13/2013  4:57 PM by Lendon Colonel, NP    At this office visit, he has no cardiac complaints, his main complaint is recurrent diarrhea, frequent, occurring all day, with now associated listlessness. He has been sent over to the emergency room, at the [facility], for further evaluation. I am concerned that his C. difficile has  returned, and he is becoming dehydrated again. I have spoken with Jenny Reichmann in the emergency room and have given him information on this patient concerning contact precautions in this setting. He is taken to the ER via wheelchair.      Hypertension   Last Assessment & Plan 10/13/2013 Office Visit Written 10/13/2013  4:58 PM by Lendon Colonel, NP    He is now taking lisinopril on his own, as his blood pressures are running low. Today in the office it is normal. With frequent diarrhea and possible dehydration, I agree with him stopping this for now.      Cerebrovascular disease   Last Assessment & Plan 09/09/2012 Office Visit Written 09/09/2012  3:42 PM by Yehuda Savannah, MD    Recent carotid ultrasound negative for obstruction. Optimal control of cardiovascular risk factors appears effective in preventing progression of disease and will be continued.      Cardiomyopathy, ischemic   Last Assessment & Plan 05/06/2013 Office Visit Written 05/06/2013  2:05 PM by Lendon Colonel, NP    Improved EF per echo this month to 50%. No further evidence of fluid retention. He is breathing better without edema and is down 5 lbs. I have asked him to weigh himself daily. If he gain 3 lbs in 24 hours. He is to take one dose of lasix with potasium that day. He is not to take it on a daily basis. He is reminded of low sodium diet. We will see him in 6 months.      Coronary atherosclerosis of native coronary artery   NSTEMI (non-ST elevated myocardial infarction) ([place])  Unstable angina (HCC)   Angina at rest [facility])   Essential hypertension     Digestive   Nausea & vomiting   C. difficile diarrhea   Last Assessment & Plan 10/13/2013 Office Visit Written 10/13/2013  4:59 PM by Lendon Colonel, NP    Sent to [facility] ER from our office to do to frequent stooling abdominal pain and known history of same 2 months ago. Contact precautions are recommended to ER staff over the phone on report to them concerning our transport  to their clinic.        Endocrine   Diabetes mellitus, type II [facility])   Last Assessment & Plan 04/27/2013 Office Visit Written 04/27/2013  1:54 PM by Lendon Colonel, NP    Followed by PCP        Genitourinary   BPH (benign prostatic hyperplasia)   Last Assessment & Plan 09/09/2012 Office Visit Edited 09/11/2012 11:21 PM by Yehuda Savannah, MD    Patient advised not to increase his dose of Flomax or other medications without specific instructions and will resume his usual daily dose.  He is scheduled to return to see Dr. Michela Pitcher in the near future for catheter removal.        Other   Hyperlipidemia   Last Assessment & Plan 05/06/2013 Office Visit Written 05/06/2013  2:06 PM by Lendon Colonel, NP    Will need to have follow up lipids and LFT's in 3 months.      Anemia, normocytic normochromic   Last Assessment & Plan 04/27/2013 Office Visit Written 04/27/2013  1:55 PM by Lendon Colonel, NP    Stable. Most recent lab work last week. Continues on iron replacement.      Noncompliance   Last Assessment & Plan 04/27/2013 Office Visit Written 04/27/2013  1:54 PM by Lendon Colonel, NP    He is not adhering to low sodium diet. I have reinforced this to him. He is to weigh daily.      Precordial chest pain   Chest pain   Diarrhea   History of Clostridium difficile   Dizziness   Abnormal ECG   Chest pain at rest   Atypical chest pain   Dyspepsia   Abdominal pain, epigastric       Imaging: Dg Chest 2 View  05/06/2015  CLINICAL DATA:  Chest pain EXAM: CHEST  2 VIEW COMPARISON:  05/04/2015 FINDINGS: Lungs are clear.  No pleural effusion or pneumothorax. Heart is normal in size. Postsurgical changes related to prior CABG. Degenerative changes of the visualized thoracolumbar spine. IMPRESSION: No evidence of acute cardiopulmonary disease. Electronically Signed   By: Julian Hy M.D.   On: 05/06/2015 13:46   Dg Chest 2 View  05/02/2015  CLINICAL DATA:  Chest  pain EXAM: CHEST  2 VIEW COMPARISON:  04/27/2015 FINDINGS: No cardiomegaly. Stable aortic contours. The patient is status post CABG. There is no edema, consolidation, effusion, or pneumothorax. No acute osseous findings. IMPRESSION: No active cardiopulmonary disease. Electronically Signed   By: Monte Fantasia M.D.   On: 05/02/2015 14:27   Dg Chest 2 View  04/27/2015  CLINICAL DATA:  Chest pain this morning. EXAM: CHEST  2 VIEW COMPARISON:  04/24/2015 FINDINGS: Postoperative changes in the mediastinum. Normal heart size and pulmonary vascularity. No focal airspace disease or consolidation in the lungs. No blunting of costophrenic angles. No pneumothorax. Degenerative changes in the spine. Calcification and torsion of the aorta. IMPRESSION: No active cardiopulmonary disease. Electronically Signed  By: Lucienne Capers M.D.   On: 04/27/2015 06:59   Dg Chest 2 View  04/24/2015  CLINICAL DATA:  81 year old male with moderate chest pain and right lower extremity pain. Initial encounter. EXAM: CHEST  2 VIEW COMPARISON:  03/30/2015 and earlier. FINDINGS: Stable cardiac size and mediastinal contours. Sequelae of CABG. Stable lung volumes. No pneumothorax, pulmonary edema, pleural effusion or acute pulmonary opacity. Osteopenia. Visualized tracheal air column is within normal limits. Chronic calcified atherosclerosis of the aorta. IMPRESSION: No acute cardiopulmonary abnormality. Electronically Signed   By: Genevie Ann M.D.   On: 04/24/2015 02:25   Ct Abdomen Pelvis W Contrast  05/04/2015  CLINICAL DATA:  Abdominal pain, constipation, last bowel movement 3 days ago, diabetes mellitus, stroke, essential benign hypertension, coronary artery disease, hyperlipidemia, GERD, history peptic ulcer disease EXAM: CT ABDOMEN AND PELVIS WITH CONTRAST TECHNIQUE: Multidetector CT imaging of the abdomen and pelvis was performed using the standard protocol following bolus administration of intravenous contrast. Sagittal and coronal  MPR images reconstructed from axial data set. CONTRAST:  39mL OMNIPAQUE IOHEXOL 300 MG/ML SOLN, 136mL OMNIPAQUE IOHEXOL 300 MG/ML SOLN COMPARISON:  03/02/2015 FINDINGS: Minimal bibasilar atelectasis. Scattered atherosclerotic calcifications aorta and coronary arteries with postsurgical changes of median sternotomy. Liver, gallbladder, spleen, pancreas, kidneys, and adrenal glands normal appearance. Normal appendix. Minimal sigmoid diverticulosis. Stomach and remaining bowel loops normal appearance. Specifically, no overt abnormal retained stool burden identified. Normal appearing bladder and ureters. Marked prostatic enlargement, gland 6.8 x 6.8 x 7.5 cm image 83. No mass, adenopathy, free air, free fluid or inflammatory process. Old fractures RIGHT lateral eighth and ninth ribs. Degenerative disc disease changes thoracolumbar spine. IMPRESSION: No acute intra-abdominal or intrapelvic abnormalities. Minimal sigmoid diverticulosis. Scattered atherosclerotic disease. Marked prostatic enlargement. Electronically Signed   By: Lavonia Dana M.D.   On: 05/04/2015 19:38   Dg Abd Acute W/chest  05/04/2015  CLINICAL DATA:  Generalized weakness since waking this morning. EXAM: DG ABDOMEN ACUTE W/ 1V CHEST COMPARISON:  05/02/2015 FINDINGS: Prior CABG. Heart and mediastinal contours are within normal limits. No focal opacities or effusions. No acute bony abnormality. Large stool burden throughout the colon. Nonobstructive bowel gas pattern. No free air organomegaly. Mild leftward scoliosis in the lumbar spine. No acute bony abnormality. IMPRESSION: Large stool burden throughout the colon. No obstruction or free air. No acute cardiopulmonary disease. Electronically Signed   By: Rolm Baptise M.D.   On: 05/04/2015 16:39

## 2015-05-07 NOTE — Progress Notes (Signed)
Cardiology Office Note   Date:  05/07/2015   ID:  Jeffrey [name], DOB [birth year], MRN [id number]  PCP:  [name]., MD  Cardiologist:  Cloria Spring, NP   No chief complaint on file.     History of Present Illness: Jeffrey [name] is a 81 y.o. male who presents for for ongoing assessment and management of CAD, previous CABG, subsequent myocardial infarction with stenting, hypertension, and diabetes mellitus. He was admitted for recurrent chest pain and dyspepsia. He was ruled out for cardiac etiology for chest pain.  An echocardiogram was completed:  2-D echocardiogram 03/02/15:- Left ventricle: The cavity size was normal. Wall thickness was normal. Systolic function was mildly reduced. The estimated ejection fraction was in the range of 45% to 50%. Diffuse hypokinesis. Doppler parameters are consistent with abnormal left ventricular relaxation (grade 1 diastolic dysfunction). - Aortic valve: Moderately calcified annulus. Trileaflet; severely thickened leaflets. Valve area (VTI): 1.63 cm^2. Valve area (Vmax): 1.77 cm^2. - Mitral valve: Mildly calcified annulus. Mildly thickened leaflets - Left atrium: The atrium was moderately dilated. - Right atrium: The atrium was mildly dilated. - Technically adequate study.  He is in the ER often for chest wall pain and anxiety. He is here post ER visit.  He states that he has to go to the ER for IV fluids because he get dehydrated. He is very confused about his medications and often takes them differently than he is prescribed. He has gone down on his coreg to 3.125 to 1/2 tablet BID, He is not taking metformin as directed. He may or may not be taking lasix. He states he is not,but cannot be sure. He has some demential but refuses SNF placement.   Past Medical History  Diagnosis Date  . Diabetes mellitus, type II ([place])   . Hyperlipidemia   . Coronary atherosclerosis of native coronary artery     a. CABG x 4 in  1989 (VG->OM1->OM2, VG->RCA, LIMA->LAD), b. 05/2010: DES to VG-OM1/OM2, DES to distal LCx. c. NSTEMI in 04/2011 - TO distal LCX stent and VG->OM2. d. 02/2012 NSTEMI DES to VG-OM1/continuation to OM2 occluded. e. inferior STEMI s/p DES to SVG-RAMUS 06/2012. f. inferolat STEMI 09/2012 s/p DES to SVG-interm; g. Lex MV (11/14):  EF 35%, inf-lat scar with small peri-infarct ischemia  . Essential hypertension, benign   . Osteoarthritis   . History of stroke   . History of pneumonia   . Cervical vertebral fracture ([place])   . Chronic back pain   . Benign prostatic hypertrophy     History of urinary retention  . Peptic ulcer disease   . Gastroesophageal reflux disease   . Ischemic cardiomyopathy Nov 2015    EF 35% cath, 45-50% by echo  . Hypothyroidism   . Anxiety about health     "multiple somatic complaints"    Past Surgical History  Procedure Laterality Date  . Tonsillectomy    . Coronary artery bypass graft  1989  . Coronary angioplasty  10/12, 8/13, 12/13, 3/14    SVG-OM PCI  . Cardiac catheterization  05/19/14    SVG-OM occl- medical Rx  . Left heart catheterization with coronary angiogram N/A 11/01/2011    Procedure: LEFT HEART CATHETERIZATION WITH CORONARY ANGIOGRAM;  Surgeon: Lorretta Harp, MD;  Location: [facility] CATH LAB;  Service: Cardiovascular;  Laterality: N/A;  . Percutaneous coronary stent intervention (pci-s) N/A 11/01/2011    Procedure: PERCUTANEOUS CORONARY STENT INTERVENTION (PCI-S);  Surgeon: Lorretta Harp, MD;  Location: [facility] CATH LAB;  Service: Cardiovascular;  Laterality: N/A;  . Left heart catheterization with coronary/graft angiogram N/A 03/10/2012    Procedure: LEFT HEART CATHETERIZATION WITH [name];  Surgeon: Sherren Mocha, MD;  Location: [facility] CATH LAB;  Service: Cardiovascular;  Laterality: N/A;  . Left heart catheterization with coronary angiogram N/A 06/18/2012    Procedure: LEFT HEART CATHETERIZATION WITH CORONARY ANGIOGRAM;  Surgeon: Peter M Martinique,  MD;  Location: [facility] CATH LAB;  Service: Cardiovascular;  Laterality: N/A;  . Percutaneous coronary stent intervention (pci-s)  06/18/2012    Procedure: PERCUTANEOUS CORONARY STENT INTERVENTION (PCI-S);  Surgeon: Peter M Martinique, MD;  Location: [facility] CATH LAB;  Service: Cardiovascular;;  . Left heart catheterization with coronary/graft angiogram  10/06/2012    Procedure: LEFT HEART CATHETERIZATION WITH [name];  Surgeon: Burnell Blanks, MD;  Location: [facility] CATH LAB;  Service: Cardiovascular;;  . Percutaneous coronary stent intervention (pci-s)  10/06/2012    Procedure: PERCUTANEOUS CORONARY STENT INTERVENTION (PCI-S);  Surgeon: Burnell Blanks, MD;  Location: [facility] CATH LAB;  Service: Cardiovascular;;  . Left heart catheterization with coronary/graft angiogram N/A 11/09/2012    Procedure: LEFT HEART CATHETERIZATION WITH [name];  Surgeon: Peter M Martinique, MD;  Location: [facility] CATH LAB;  Service: Cardiovascular;  Laterality: N/A;  . Left heart catheterization with coronary/graft angiogram N/A 05/19/2014    Procedure: LEFT HEART CATHETERIZATION WITH [name];  Surgeon: Troy Sine, MD;  Location: [facility] CATH LAB;  Service: Cardiovascular;  Laterality: N/A;     Current Outpatient Prescriptions  Medication Sig Dispense Refill  . acetaminophen (TYLENOL) 325 MG tablet Take 2 tablets (650 mg total) by mouth every 4 (four) hours as needed for headache or mild pain. (Patient taking differently: Take 325 mg by mouth 2 (two) times daily as needed for headache or mild pain. )    . albuterol (PROVENTIL HFA;VENTOLIN HFA) 108 (90 BASE) MCG/ACT inhaler Inhale 2 puffs into the lungs every 6 (six) hours as needed for wheezing or shortness of breath.    Marland Kitchen aspirin EC 81 MG tablet Take 81 mg by mouth daily.    Marland Kitchen atorvastatin (LIPITOR) 40 MG tablet Take 1 tablet (40 mg total) by mouth daily at 6 PM. 30 tablet 11  . bismuth subsalicylate (PEPTO BISMOL) 262 MG/15ML suspension  Take 30 mLs by mouth every 6 (six) hours as needed for indigestion or diarrhea or loose stools.     Marland Kitchen BRILINTA 90 MG TABS tablet TAKE 1 TABLET BY MOUTH TWICE DAILY. 60 tablet 3  . carvedilol (COREG) 3.125 MG tablet TAKE 1 TABLET BY MOUTH TWICE DAILY WITH MEALS. (HEART RATE & BLOOD PRESSURE) 60 tablet 3  . Chlorphen-Pseudoephed-APAP (RA SINUS TABLETS EX ST PO) Take 1 tablet by mouth as needed (for sinus).    . famotidine (PEPCID) 20 MG tablet Take 1 tablet (20 mg total) by mouth at bedtime. 30 tablet 3  . furosemide (LASIX) 20 MG tablet Take 20 mg by mouth as needed for fluid. Take 1 tablet as needed if 3 lb weight gain.    . isosorbide mononitrate (IMDUR) 30 MG 24 hr tablet Take 1.5 tablets (45 mg total) by mouth daily. 45 tablet 11  . levothyroxine (SYNTHROID, LEVOTHROID) 50 MCG tablet Take 50 mcg by mouth daily before breakfast.     . loperamide (IMODIUM) 2 MG capsule Take 1 capsule (2 mg total) by mouth 4 (four) times daily as needed for diarrhea or loose stools. 12 capsule 0  . metFORMIN (GLUCOPHAGE) 500 MG tablet Take 2 tablets (1,000  mg total) by mouth 2 (two) times daily with a meal. RESTART ON Sunday, 03/04/15. (Patient taking differently: Take 1,000 mg by mouth 2 (two) times daily with a meal. )    . Multiple Vitamin (MULTIVITAMIN WITH MINERALS) TABS Take 1 tablet by mouth daily.    . nitroGLYCERIN (NITROSTAT) 0.4 MG SL tablet Place 0.4 mg under the tongue every 5 (five) minutes as needed for chest pain.    . potassium chloride (K-DUR) 10 MEQ tablet Take 10 mEq by mouth as needed. Takes along with the Furosemide when he has swelling.    . tamsulosin (FLOMAX) 0.4 MG CAPS Take 0.4 mg by mouth daily after supper.      No current facility-administered medications for this visit.    Allergies:   Review of patient's allergies indicates no known allergies.    Social History:  The patient  reports that he quit smoking about 53 years ago. His smoking use included Cigarettes. He started smoking  about 64 years ago. He has a 20 pack-year smoking history. His smokeless tobacco use includes Chew. He reports that he does not drink alcohol.   Family History:  The patient's family history includes Appendicitis in his mother; Early death in an other family member; Heart attack (age of onset: 49) in his father.    ROS: All other systems are reviewed and negative. Unless otherwise mentioned in H&P    PHYSICAL EXAM: VS:  BP 160/74 mmHg  Pulse 91  Ht 5\' 10"  (1.778 m)  Wt 172 lb (78.019 kg)  BMI 24.68 kg/m2  SpO2 98% , BMI Body mass index is 24.68 kg/(m^2). GEN: Well nourished, well developed, in no acute distress HEENT: normal Neck: no JVD, carotid bruits, or masses Cardiac: RRR-tachycardic; no murmurs, rubs, or gallops,no edema  Respiratory:  clear to auscultation bilaterally, normal work of breathing GI: soft, nontender, nondistended, + BS MS: no deformity or atrophy Skin: warm and dry, no rash Neuro:  Strength and sensation are intact Psych: euthymic mood, full affect   Recent Labs: 06/09/2014: Magnesium 1.8 03/02/2015: TSH 0.372 05/02/2015: B Natriuretic Peptide 217.0* 05/04/2015: ALT 23 05/06/2015: BUN 13; Creatinine, Ser 0.92; Hemoglobin 13.1; Platelets 157; Potassium 4.2; Sodium 139    Lipid Panel    Component Value Date/Time   CHOL 190 05/29/2013 0410   TRIG 335* 05/29/2013 0410   HDL 33* 05/29/2013 0410   CHOLHDL 5.8 05/29/2013 0410   VLDL 67* 05/29/2013 0410   LDLCALC 90 05/29/2013 0410      Wt Readings from Last 3 Encounters:  05/07/15 172 lb (78.019 kg)  05/06/15 170 lb (77.111 kg)  05/04/15 170 lb (77.111 kg)      ASSESSMENT AND PLAN:  1. Hypertension: He is to take coreg 3.125 mg BID as directed. He is not to cut them in half. He is counseled on medication compliance. He verbalizes understanding. His dementia becoming concerning. He may need to be placed against his will if he continues confusion of medications and frequent ER visits.   2. CAD: He  has chronic chest pain. He will continue isosorbide 30 mg daily and cut back on the 1/2 tablet. Continue BB as directed. Continue ASA and Brilinta.   3. Systolic Dysfunction: He is not taking lasix-he thinks- I have asked him to be more mindful of what he is taking and to make sure he eats and drinks to keep from being dehydrated and stay out of the ER.   4. Diabetes: He is not taking meformin as directed  and BG has been elevated. I have asked him to take the metformin 500 mg BID instead of when he wants to.He is to see PCP for more recommendations.          Current medicines are reviewed at length with the patient today.     No orders of the defined types were placed in this encounter.     Disposition:   FU with 3 months  Signed, Jory Sims, NP  05/07/2015 2:10 PM    [facility] [address] [place], [place] [address] Phone: [phone]; Fax: [phone]

## 2015-05-07 NOTE — Patient Instructions (Signed)
Your physician recommends that you schedule a follow-up appointment in: 3 months with Dr. Bronson Ing  Your physician recommends that you continue on your current medications as directed. Please refer to the Current Medication list given to you today.  PLEASE TAKE ALL MEDICATIONS AS DIRECTED!!!  TAKE COREG 3.125 MG TWO TIMES DAILY  If you need a refill on your cardiac medications before your next appointment, please call your pharmacy.  Thank you for choosing [facility]! '

## 2015-05-08 LAB — URINE CULTURE

## 2015-05-14 ENCOUNTER — Emergency Department (HOSPITAL_COMMUNITY): Payer: Medicare Other

## 2015-05-14 ENCOUNTER — Emergency Department (HOSPITAL_COMMUNITY)
Admission: EM | Admit: 2015-05-14 | Discharge: 2015-05-14 | Disposition: A | Discharge status: Home / Self Care | Payer: Medicare Other | Attending: Emergency Medicine | Admitting: Emergency Medicine

## 2015-05-14 ENCOUNTER — Encounter (HOSPITAL_COMMUNITY): Payer: Self-pay | Admitting: Emergency Medicine

## 2015-05-14 DIAGNOSIS — K219 Gastro-esophageal reflux disease without esophagitis: Secondary | ICD-10-CM | POA: Insufficient documentation

## 2015-05-14 DIAGNOSIS — K5900 Constipation, unspecified: Secondary | ICD-10-CM | POA: Insufficient documentation

## 2015-05-14 DIAGNOSIS — Z8711 Personal history of peptic ulcer disease: Secondary | ICD-10-CM | POA: Diagnosis not present

## 2015-05-14 DIAGNOSIS — Z87891 Personal history of nicotine dependence: Secondary | ICD-10-CM | POA: Diagnosis not present

## 2015-05-14 DIAGNOSIS — Z9889 Other specified postprocedural states: Secondary | ICD-10-CM | POA: Diagnosis not present

## 2015-05-14 DIAGNOSIS — Z79899 Other long term (current) drug therapy: Secondary | ICD-10-CM | POA: Diagnosis not present

## 2015-05-14 DIAGNOSIS — I252 Old myocardial infarction: Secondary | ICD-10-CM | POA: Diagnosis not present

## 2015-05-14 DIAGNOSIS — E785 Hyperlipidemia, unspecified: Secondary | ICD-10-CM | POA: Diagnosis not present

## 2015-05-14 DIAGNOSIS — E039 Hypothyroidism, unspecified: Secondary | ICD-10-CM | POA: Insufficient documentation

## 2015-05-14 DIAGNOSIS — M1990 Unspecified osteoarthritis, unspecified site: Secondary | ICD-10-CM | POA: Insufficient documentation

## 2015-05-14 DIAGNOSIS — Z8781 Personal history of (healed) traumatic fracture: Secondary | ICD-10-CM | POA: Diagnosis not present

## 2015-05-14 DIAGNOSIS — Z951 Presence of aortocoronary bypass graft: Secondary | ICD-10-CM | POA: Insufficient documentation

## 2015-05-14 DIAGNOSIS — I10 Essential (primary) hypertension: Secondary | ICD-10-CM | POA: Diagnosis not present

## 2015-05-14 DIAGNOSIS — Z7982 Long term (current) use of aspirin: Secondary | ICD-10-CM | POA: Diagnosis not present

## 2015-05-14 DIAGNOSIS — Z8673 Personal history of transient ischemic attack (TIA), and cerebral infarction without residual deficits: Secondary | ICD-10-CM | POA: Diagnosis not present

## 2015-05-14 DIAGNOSIS — Z8701 Personal history of pneumonia (recurrent): Secondary | ICD-10-CM | POA: Diagnosis not present

## 2015-05-14 DIAGNOSIS — Z9861 Coronary angioplasty status: Secondary | ICD-10-CM | POA: Insufficient documentation

## 2015-05-14 DIAGNOSIS — F418 Other specified anxiety disorders: Secondary | ICD-10-CM | POA: Diagnosis not present

## 2015-05-14 DIAGNOSIS — N400 Enlarged prostate without lower urinary tract symptoms: Secondary | ICD-10-CM | POA: Diagnosis not present

## 2015-05-14 DIAGNOSIS — I2510 Atherosclerotic heart disease of native coronary artery without angina pectoris: Secondary | ICD-10-CM | POA: Diagnosis not present

## 2015-05-14 DIAGNOSIS — E119 Type 2 diabetes mellitus without complications: Secondary | ICD-10-CM | POA: Insufficient documentation

## 2015-05-14 DIAGNOSIS — G8929 Other chronic pain: Secondary | ICD-10-CM | POA: Diagnosis not present

## 2015-05-14 DIAGNOSIS — R1031 Right lower quadrant pain: Secondary | ICD-10-CM | POA: Diagnosis present

## 2015-05-14 LAB — CBC WITH DIFFERENTIAL/PLATELET
BASOS ABS: 0 10*3/uL (ref 0.0–0.1)
BASOS PCT: 0 %
Eosinophils Absolute: 0.2 10*3/uL (ref 0.0–0.7)
Eosinophils Relative: 3 %
HEMATOCRIT: 38.8 % — AB (ref 39.0–52.0)
HEMOGLOBIN: 13.1 g/dL (ref 13.0–17.0)
Lymphocytes Relative: 18 %
Lymphs Abs: 1.2 10*3/uL (ref 0.7–4.0)
MCH: 32.1 pg (ref 26.0–34.0)
MCHC: 33.8 g/dL (ref 30.0–36.0)
MCV: 95.1 fL (ref 78.0–100.0)
Monocytes Absolute: 0.7 10*3/uL (ref 0.1–1.0)
Monocytes Relative: 10 %
NEUTROS ABS: 4.5 10*3/uL (ref 1.7–7.7)
NEUTROS PCT: 69 %
Platelets: 145 10*3/uL — ABNORMAL LOW (ref 150–400)
RBC: 4.08 MIL/uL — AB (ref 4.22–5.81)
RDW: 12.8 % (ref 11.5–15.5)
WBC: 6.5 10*3/uL (ref 4.0–10.5)

## 2015-05-14 LAB — URINALYSIS, ROUTINE W REFLEX MICROSCOPIC
Bilirubin Urine: NEGATIVE
Glucose, UA: 100 mg/dL — AB
Hgb urine dipstick: NEGATIVE
KETONES UR: NEGATIVE mg/dL
LEUKOCYTES UA: NEGATIVE
NITRITE: NEGATIVE
PROTEIN: NEGATIVE mg/dL
Specific Gravity, Urine: 1.015 (ref 1.005–1.030)
Urobilinogen, UA: 0.2 mg/dL (ref 0.0–1.0)
pH: 7 (ref 5.0–8.0)

## 2015-05-14 LAB — COMPREHENSIVE METABOLIC PANEL
ALBUMIN: 3.8 g/dL (ref 3.5–5.0)
ALK PHOS: 79 U/L (ref 38–126)
ALT: 25 U/L (ref 17–63)
AST: 25 U/L (ref 15–41)
Anion gap: 7 (ref 5–15)
BILIRUBIN TOTAL: 0.6 mg/dL (ref 0.3–1.2)
BUN: 12 mg/dL (ref 6–20)
CO2: 26 mmol/L (ref 22–32)
Calcium: 9 mg/dL (ref 8.9–10.3)
Chloride: 105 mmol/L (ref 101–111)
Creatinine, Ser: 0.78 mg/dL (ref 0.61–1.24)
GFR calc Af Amer: >60 mL/min (ref >60)
GFR calc non Af Amer: >60 mL/min (ref >60)
GLUCOSE: 180 mg/dL — AB (ref 65–99)
POTASSIUM: 3.7 mmol/L (ref 3.5–5.1)
Sodium: 138 mmol/L (ref 135–145)
TOTAL PROTEIN: 6.5 g/dL (ref 6.5–8.1)

## 2015-05-14 LAB — CBG MONITORING, ED: Glucose-Capillary: 213 mg/dL — ABNORMAL HIGH (ref 65–99)

## 2015-05-14 MED ORDER — POLYETHYLENE GLYCOL 3350 17 GM/SCOOP PO POWD: ORAL | Status: DC

## 2015-05-14 NOTE — ED Provider Notes (Signed)
CSN: [id number]     Arrival date & time 05/14/15  0413 History   First MD Initiated Contact with Patient 05/14/15 [phone]     Chief Complaint  Patient presents with  . Abdominal Pain     (Consider location/radiation/quality/duration/timing/severity/associated sxs/prior Treatment) HPI  This is an 81 year old male with a history of coronary artery disease, hyperlipidemia, diabetes who presents with lower abdominal pain.  Patient reports progressively worsening lower abdominal pain overnight. It was 8 out of 10. It is currently 2 out of 10. It is located in the bilateral lower abdomen. He states he has had issues with similar pain recently and has had a CT scan that was negative. Patient reports constipation. Last stool was last night but it was hard and small. He has been taking stool softeners. He has not taken any laxatives. He denies any nausea, vomiting. He describes the pain as achy and nonradiating. He denies any dysuria or hematuria.  Past Medical History  Diagnosis Date  . Diabetes mellitus, type II ([place])   . Hyperlipidemia   . Coronary atherosclerosis of native coronary artery     a. CABG x 4 in 1989 (VG->OM1->OM2, VG->RCA, LIMA->LAD), b. 05/2010: DES to VG-OM1/OM2, DES to distal LCx. c. NSTEMI in 04/2011 - TO distal LCX stent and VG->OM2. d. 02/2012 NSTEMI DES to VG-OM1/continuation to OM2 occluded. e. inferior STEMI s/p DES to SVG-RAMUS 06/2012. f. inferolat STEMI 09/2012 s/p DES to SVG-interm; g. Lex MV (11/14):  EF 35%, inf-lat scar with small peri-infarct ischemia  . Essential hypertension, benign   . Osteoarthritis   . History of stroke   . History of pneumonia   . Cervical vertebral fracture ([place])   . Chronic back pain   . Benign prostatic hypertrophy     History of urinary retention  . Peptic ulcer disease   . Gastroesophageal reflux disease   . Ischemic cardiomyopathy Nov 2015    EF 35% cath, 45-50% by echo  . Hypothyroidism   . Anxiety about health     "multiple somatic  complaints"   Past Surgical History  Procedure Laterality Date  . Tonsillectomy    . Coronary artery bypass graft  1989  . Coronary angioplasty  10/12, 8/13, 12/13, 3/14    SVG-OM PCI  . Cardiac catheterization  05/19/14    SVG-OM occl- medical Rx  . Left heart catheterization with coronary angiogram N/A 11/01/2011    Procedure: LEFT HEART CATHETERIZATION WITH CORONARY ANGIOGRAM;  Surgeon: Lorretta Harp, MD;  Location: [facility] CATH LAB;  Service: Cardiovascular;  Laterality: N/A;  . Percutaneous coronary stent intervention (pci-s) N/A 11/01/2011    Procedure: PERCUTANEOUS CORONARY STENT INTERVENTION (PCI-S);  Surgeon: Lorretta Harp, MD;  Location: [facility] CATH LAB;  Service: Cardiovascular;  Laterality: N/A;  . Left heart catheterization with coronary/graft angiogram N/A 03/10/2012    Procedure: LEFT HEART CATHETERIZATION WITH [name];  Surgeon: Sherren Mocha, MD;  Location: [facility] CATH LAB;  Service: Cardiovascular;  Laterality: N/A;  . Left heart catheterization with coronary angiogram N/A 06/18/2012    Procedure: LEFT HEART CATHETERIZATION WITH CORONARY ANGIOGRAM;  Surgeon: Peter M Martinique, MD;  Location: [facility] CATH LAB;  Service: Cardiovascular;  Laterality: N/A;  . Percutaneous coronary stent intervention (pci-s)  06/18/2012    Procedure: PERCUTANEOUS CORONARY STENT INTERVENTION (PCI-S);  Surgeon: Peter M Martinique, MD;  Location: [facility] CATH LAB;  Service: Cardiovascular;;  . Left heart catheterization with coronary/graft angiogram  10/06/2012    Procedure: LEFT HEART CATHETERIZATION WITH CORONARY/GRAFT ANGIOGRAM;  Surgeon:  Burnell Blanks, MD;  Location: [facility] CATH LAB;  Service: Cardiovascular;;  . Percutaneous coronary stent intervention (pci-s)  10/06/2012    Procedure: PERCUTANEOUS CORONARY STENT INTERVENTION (PCI-S);  Surgeon: Burnell Blanks, MD;  Location: [facility] CATH LAB;  Service: Cardiovascular;;  . Left heart catheterization with coronary/graft angiogram N/A 11/09/2012     Procedure: LEFT HEART CATHETERIZATION WITH [name];  Surgeon: Peter M Martinique, MD;  Location: [facility] CATH LAB;  Service: Cardiovascular;  Laterality: N/A;  . Left heart catheterization with coronary/graft angiogram N/A 05/19/2014    Procedure: LEFT HEART CATHETERIZATION WITH [name];  Surgeon: Troy Sine, MD;  Location: [facility] CATH LAB;  Service: Cardiovascular;  Laterality: N/A;   Family History  Problem Relation Age of Onset  . Early death      Parents died young  . Appendicitis Mother     Pt was 62 year old  . Heart attack Father 32   Social History  Substance Use Topics  . Smoking status: Former Smoker -- 2.00 packs/day for 10 years    Types: Cigarettes    Start date: 07/14/1950    Quit date: 07/14/1961  . Smokeless tobacco: Current User    Types: Chew  . Alcohol Use: No    Review of Systems  Constitutional: Negative.  Negative for fever.  Respiratory: Negative.  Negative for chest tightness and shortness of breath.   Cardiovascular: Negative.  Negative for chest pain.  Gastrointestinal: Positive for abdominal pain and constipation. Negative for nausea and vomiting.  Genitourinary: Negative.  Negative for dysuria.  Neurological: Negative for headaches.  All other systems reviewed and are negative.     Allergies  Review of patient's allergies indicates no known allergies.  Home Medications   Prior to Admission medications   Medication Sig Start Date End Date Taking? Authorizing Provider  acetaminophen (TYLENOL) 325 MG tablet Take 2 tablets (650 mg total) by mouth every 4 (four) hours as needed for headache or mild pain. Patient taking differently: Take 325 mg by mouth 2 (two) times daily as needed for headache or mild pain.  05/21/14   Erlene Quan, PA-C  albuterol (PROVENTIL HFA;VENTOLIN HFA) 108 (90 BASE) MCG/ACT inhaler Inhale 2 puffs into the lungs every 6 (six) hours as needed for wheezing or shortness of breath.    Historical Provider,  MD  aspirin EC 81 MG tablet Take 81 mg by mouth daily.    Historical Provider, MD  atorvastatin (LIPITOR) 40 MG tablet Take 1 tablet (40 mg total) by mouth daily at 6 PM. 05/29/13   Liliane Shi, PA-C  bismuth subsalicylate (PEPTO BISMOL) 262 MG/15ML suspension Take 30 mLs by mouth every 6 (six) hours as needed for indigestion or diarrhea or loose stools.     Historical Provider, MD  BRILINTA 90 MG TABS tablet TAKE 1 TABLET BY MOUTH TWICE DAILY. 01/16/15   Herminio Commons, MD  carvedilol (COREG) 3.125 MG tablet TAKE 1 TABLET BY MOUTH TWICE DAILY WITH MEALS. (HEART RATE & BLOOD PRESSURE) 03/05/15   Lendon Colonel, NP  Chlorphen-Pseudoephed-APAP (RA SINUS TABLETS EX ST PO) Take 1 tablet by mouth as needed (for sinus).    Historical Provider, MD  famotidine (PEPCID) 20 MG tablet Take 1 tablet (20 mg total) by mouth at bedtime. 03/03/15   Rexene Alberts, MD  furosemide (LASIX) 20 MG tablet Take 20 mg by mouth as needed for fluid. Take 1 tablet as needed if 3 lb weight gain. 05/06/13   Lendon Colonel,  NP  isosorbide mononitrate (IMDUR) 30 MG 24 hr tablet Take 1.5 tablets (45 mg total) by mouth daily. 03/03/15   Rexene Alberts, MD  levothyroxine (SYNTHROID, LEVOTHROID) 50 MCG tablet Take 50 mcg by mouth daily before breakfast.  09/08/14   Historical Provider, MD  loperamide (IMODIUM) 2 MG capsule Take 1 capsule (2 mg total) by mouth 4 (four) times daily as needed for diarrhea or loose stools. 04/01/15   Kristen N Ward, DO  metFORMIN (GLUCOPHAGE) 500 MG tablet Take 2 tablets (1,000 mg total) by mouth 2 (two) times daily with a meal. RESTART ON Sunday, 03/04/15. Patient taking differently: Take 1,000 mg by mouth 2 (two) times daily with a meal.  03/03/15   Rexene Alberts, MD  Multiple Vitamin (MULTIVITAMIN WITH MINERALS) TABS Take 1 tablet by mouth daily.    Historical Provider, MD  nitroGLYCERIN (NITROSTAT) 0.4 MG SL tablet Place 0.4 mg under the tongue every 5 (five) minutes as needed for chest pain.     Historical Provider, MD  polyethylene glycol powder (MIRALAX) powder Take one capful by mouth twice daily until stools are loose 05/14/15   Merryl Hacker, MD  potassium chloride (K-DUR) 10 MEQ tablet Take 10 mEq by mouth as needed. Takes along with the Furosemide when he has swelling.    Historical Provider, MD  tamsulosin (FLOMAX) 0.4 MG CAPS Take 0.4 mg by mouth daily after supper.  01/27/13   Historical Provider, MD   BP 134/76 mmHg  Pulse 77  Temp(Src) 97.9 F (36.6 C) (Oral)  Resp 15  Ht 5\' 10"  (1.778 m)  Wt 170 lb (77.111 kg)  BMI 24.39 kg/m2  SpO2 100% Physical Exam  Constitutional: He is oriented to person, place, and time. He appears well-developed and well-nourished. No distress.  Elderly  HENT:  Head: Normocephalic and atraumatic.  Cardiovascular: Normal rate, regular rhythm and normal heart sounds.   No murmur heard. Pulmonary/Chest: Effort normal and breath sounds normal. No respiratory distress. He has no wheezes.  Abdominal: Soft. Bowel sounds are normal. There is tenderness. There is no rebound.  Mild suprapubic tenderness to palpation without rebound or guarding  Musculoskeletal: He exhibits no edema.  Neurological: He is alert and oriented to person, place, and time.  Skin: Skin is warm and dry.  Psychiatric: He has a normal mood and affect.  Nursing note and vitals reviewed.   ED Course  Procedures (including critical care time) Labs Review Labs Reviewed  URINALYSIS, ROUTINE W REFLEX MICROSCOPIC (NOT AT [facility]) - Abnormal; Notable for the following:    Glucose, UA 100 (*)    All other components within normal limits  CBC WITH DIFFERENTIAL/PLATELET - Abnormal; Notable for the following:    RBC 4.08 (*)    HCT 38.8 (*)    Platelets 145 (*)    All other components within normal limits  COMPREHENSIVE METABOLIC PANEL - Abnormal; Notable for the following:    Glucose, Bld 180 (*)    All other components within normal limits    Imaging Review Dg Abd Acute  W/chest  05/14/2015  CLINICAL DATA:  Cramping intermittent low abdominal pain. EXAM: DG ABDOMEN ACUTE W/ 1V CHEST COMPARISON:  None. FINDINGS: There is a large volume colonic stool. The bowel gas pattern is negative for obstruction or perforation. No biliary or urinary calculi are evident. Upright view of the chest is negative for significant cardiopulmonary abnormality is unchanged from 05/06/2015. There is prior sternotomy and CABG. IMPRESSION: Negative for obstruction or perforation. Large volume colonic  stool. No acute cardiopulmonary finding. Electronically Signed   By: Andreas Newport M.D.   On: 05/14/2015 06:50   I have personally reviewed and evaluated these images and lab results as part of my medical decision-making.   EKG Interpretation   Date/Time:  Monday May 14 2015 05:26:56 EDT Ventricular Rate:  68 PR Interval:  187 QRS Duration: 115 QT Interval:  447 QTC Calculation: 475 R Axis:   86 Text Interpretation:  Sinus rhythm Nonspecific intraventricular conduction  delay Nonspecific repol abnormality, diffuse leads No significant change  since last tracing Confirmed by HORTON  MD, [place] ([id number]) on 05/14/2015  6:37:48 AM      MDM   Final diagnoses:  Constipation, unspecified constipation type    Patient presents with lower abdominal pain. Has been seen and evaluated recently for similar symptoms and had a negative CT scan. Also reports constipation. However, no other obstructive symptoms. Initial vital signs are reassuring. He has mild suprapubic tenderness but otherwise a benign abdomen. Lab work obtained and reassuring. No evidence of urinary tract infection. Acute abdominal series shows a large volume of colonic stool but no obstruction. Patient reports that he has had multiple bowel movements without intervention while in the ER and feels better. Discussed with patient starting MiraLAX at home twice daily until he has loose stools. Patient stated understanding. He  was given strict return precautions.  After history, exam, and medical workup I feel the patient has been appropriately medically screened and is safe for discharge home. Pertinent diagnoses were discussed with the patient. Patient was given return precautions.     Merryl Hacker, MD 05/14/15 [id number]

## 2015-05-14 NOTE — ED Notes (Signed)
Pt states he has lower abdominal pain. He took stool softener last night with no results and has had no relief. No urinary symptoms.

## 2015-05-14 NOTE — Discharge Instructions (Signed)

## 2015-05-15 ENCOUNTER — Other Ambulatory Visit: Payer: Self-pay | Admitting: Cardiovascular Disease

## 2015-05-19 ENCOUNTER — Encounter (HOSPITAL_COMMUNITY): Payer: Self-pay | Admitting: *Deleted

## 2015-05-19 ENCOUNTER — Emergency Department (HOSPITAL_COMMUNITY)
Admission: EM | Admit: 2015-05-19 | Discharge: 2015-05-19 | Disposition: A | Discharge status: Home / Self Care | Payer: Medicare Other | Attending: Emergency Medicine | Admitting: Emergency Medicine

## 2015-05-19 DIAGNOSIS — Z8673 Personal history of transient ischemic attack (TIA), and cerebral infarction without residual deficits: Secondary | ICD-10-CM | POA: Diagnosis not present

## 2015-05-19 DIAGNOSIS — Z8781 Personal history of (healed) traumatic fracture: Secondary | ICD-10-CM | POA: Diagnosis not present

## 2015-05-19 DIAGNOSIS — E039 Hypothyroidism, unspecified: Secondary | ICD-10-CM | POA: Diagnosis not present

## 2015-05-19 DIAGNOSIS — E860 Dehydration: Secondary | ICD-10-CM | POA: Diagnosis not present

## 2015-05-19 DIAGNOSIS — Z8701 Personal history of pneumonia (recurrent): Secondary | ICD-10-CM | POA: Insufficient documentation

## 2015-05-19 DIAGNOSIS — Z9861 Coronary angioplasty status: Secondary | ICD-10-CM | POA: Insufficient documentation

## 2015-05-19 DIAGNOSIS — Z87891 Personal history of nicotine dependence: Secondary | ICD-10-CM | POA: Diagnosis not present

## 2015-05-19 DIAGNOSIS — E119 Type 2 diabetes mellitus without complications: Secondary | ICD-10-CM | POA: Insufficient documentation

## 2015-05-19 DIAGNOSIS — N400 Enlarged prostate without lower urinary tract symptoms: Secondary | ICD-10-CM | POA: Insufficient documentation

## 2015-05-19 DIAGNOSIS — Z7982 Long term (current) use of aspirin: Secondary | ICD-10-CM | POA: Diagnosis not present

## 2015-05-19 DIAGNOSIS — M1990 Unspecified osteoarthritis, unspecified site: Secondary | ICD-10-CM | POA: Diagnosis not present

## 2015-05-19 DIAGNOSIS — I10 Essential (primary) hypertension: Secondary | ICD-10-CM | POA: Insufficient documentation

## 2015-05-19 DIAGNOSIS — F418 Other specified anxiety disorders: Secondary | ICD-10-CM | POA: Diagnosis not present

## 2015-05-19 DIAGNOSIS — I2510 Atherosclerotic heart disease of native coronary artery without angina pectoris: Secondary | ICD-10-CM | POA: Insufficient documentation

## 2015-05-19 DIAGNOSIS — E785 Hyperlipidemia, unspecified: Secondary | ICD-10-CM | POA: Insufficient documentation

## 2015-05-19 DIAGNOSIS — Z79899 Other long term (current) drug therapy: Secondary | ICD-10-CM | POA: Insufficient documentation

## 2015-05-19 DIAGNOSIS — Z8711 Personal history of peptic ulcer disease: Secondary | ICD-10-CM | POA: Insufficient documentation

## 2015-05-19 DIAGNOSIS — Z951 Presence of aortocoronary bypass graft: Secondary | ICD-10-CM | POA: Diagnosis not present

## 2015-05-19 DIAGNOSIS — R197 Diarrhea, unspecified: Secondary | ICD-10-CM | POA: Diagnosis not present

## 2015-05-19 DIAGNOSIS — G8929 Other chronic pain: Secondary | ICD-10-CM | POA: Diagnosis not present

## 2015-05-19 DIAGNOSIS — Z8719 Personal history of other diseases of the digestive system: Secondary | ICD-10-CM | POA: Insufficient documentation

## 2015-05-19 LAB — COMPREHENSIVE METABOLIC PANEL
ALBUMIN: 3.7 g/dL (ref 3.5–5.0)
ALT: 25 U/L (ref 17–63)
AST: 29 U/L (ref 15–41)
Alkaline Phosphatase: 82 U/L (ref 38–126)
Anion gap: 8 (ref 5–15)
BUN: 14 mg/dL (ref 6–20)
CHLORIDE: 102 mmol/L (ref 101–111)
CO2: 25 mmol/L (ref 22–32)
CREATININE: 0.89 mg/dL (ref 0.61–1.24)
Calcium: 8.9 mg/dL (ref 8.9–10.3)
GFR calc Af Amer: >60 mL/min (ref >60)
GLUCOSE: 251 mg/dL — AB (ref 65–99)
POTASSIUM: 3.6 mmol/L (ref 3.5–5.1)
Sodium: 135 mmol/L (ref 135–145)
Total Bilirubin: 0.6 mg/dL (ref 0.3–1.2)
Total Protein: 6.2 g/dL — ABNORMAL LOW (ref 6.5–8.1)

## 2015-05-19 LAB — URINALYSIS, ROUTINE W REFLEX MICROSCOPIC
Bilirubin Urine: NEGATIVE
GLUCOSE, UA: 250 mg/dL — AB
HGB URINE DIPSTICK: NEGATIVE
LEUKOCYTES UA: NEGATIVE
Nitrite: NEGATIVE
PH: 5.5 (ref 5.0–8.0)
PROTEIN: NEGATIVE mg/dL
SPECIFIC GRAVITY, URINE: 1.025 (ref 1.005–1.030)
Urobilinogen, UA: 0.2 mg/dL (ref 0.0–1.0)

## 2015-05-19 LAB — CBC WITH DIFFERENTIAL/PLATELET
BASOS ABS: 0 10*3/uL (ref 0.0–0.1)
BASOS PCT: 0 %
EOS PCT: 3 %
Eosinophils Absolute: 0.2 10*3/uL (ref 0.0–0.7)
HCT: 38.3 % — ABNORMAL LOW (ref 39.0–52.0)
Hemoglobin: 13 g/dL (ref 13.0–17.0)
LYMPHS PCT: 17 %
Lymphs Abs: 1.2 10*3/uL (ref 0.7–4.0)
MCH: 32.3 pg (ref 26.0–34.0)
MCHC: 33.9 g/dL (ref 30.0–36.0)
MCV: 95.3 fL (ref 78.0–100.0)
MONO ABS: 0.6 10*3/uL (ref 0.1–1.0)
Monocytes Relative: 9 %
NEUTROS ABS: 5 10*3/uL (ref 1.7–7.7)
Neutrophils Relative %: 71 %
PLATELETS: 147 10*3/uL — AB (ref 150–400)
RBC: 4.02 MIL/uL — AB (ref 4.22–5.81)
RDW: 12.5 % (ref 11.5–15.5)
WBC: 7 10*3/uL (ref 4.0–10.5)

## 2015-05-19 MED ORDER — SODIUM CHLORIDE 0.9 % IV BOLUS (SEPSIS)
500.0000 mL | Freq: Once | INTRAVENOUS | Status: AC
  Administered 2015-05-19: 500 mL via INTRAVENOUS

## 2015-05-19 NOTE — Discharge Instructions (Signed)
If you were given medicines take as directed.  If you are on coumadin or contraceptives realize their levels and effectiveness is altered by many different medicines.  If you have any reaction (rash, tongues swelling, other) to the medicines stop taking and see a physician.    If your blood pressure was elevated in the ER make sure you follow up for management with a primary doctor or return for chest pain, shortness of breath or stroke symptoms.  Please follow up as directed and return to the ER or see a physician for new or worsening symptoms.  Thank you. Filed Vitals:   05/19/15 1310 05/19/15 1512  BP: 111/56 142/80  Pulse: 102 78  Temp: 98.1 F (36.7 C) 98.2 F (36.8 C)  TempSrc: Oral   Resp: 16 16  Height: 5\' 10"  (1.778 m)   Weight: 170 lb (77.111 kg)   SpO2: 97% 100%

## 2015-05-19 NOTE — ED Provider Notes (Signed)
CSN: [id number]     Arrival date & time 05/19/15  1304 History   First MD Initiated Contact with Patient 05/19/15 1411     Chief Complaint  Patient presents with  . Dehydration     (Consider location/radiation/quality/duration/timing/severity/associated sxs/prior Treatment) HPI Comments: 81 year old male with history of lipids, coronary artery disease, C. difficile diarrhea presents with intermittent diarrhea since last night. No recent antibiotics. No blood in it no fevers or chills no vomiting. No abdominal pain. Patient tolerating oral. No sick contacts or recent travel  The history is provided by the patient.    Past Medical History  Diagnosis Date  . Diabetes mellitus, type II ([place])   . Hyperlipidemia   . Coronary atherosclerosis of native coronary artery     a. CABG x 4 in 1989 (VG->OM1->OM2, VG->RCA, LIMA->LAD), b. 05/2010: DES to VG-OM1/OM2, DES to distal LCx. c. NSTEMI in 04/2011 - TO distal LCX stent and VG->OM2. d. 02/2012 NSTEMI DES to VG-OM1/continuation to OM2 occluded. e. inferior STEMI s/p DES to SVG-RAMUS 06/2012. f. inferolat STEMI 09/2012 s/p DES to SVG-interm; g. Lex MV (11/14):  EF 35%, inf-lat scar with small peri-infarct ischemia  . Essential hypertension, benign   . Osteoarthritis   . History of stroke   . History of pneumonia   . Cervical vertebral fracture ([place])   . Chronic back pain   . Benign prostatic hypertrophy     History of urinary retention  . Peptic ulcer disease   . Gastroesophageal reflux disease   . Ischemic cardiomyopathy Nov 2015    EF 35% cath, 45-50% by echo  . Hypothyroidism   . Anxiety about health     "multiple somatic complaints"   Past Surgical History  Procedure Laterality Date  . Tonsillectomy    . Coronary artery bypass graft  1989  . Coronary angioplasty  10/12, 8/13, 12/13, 3/14    SVG-OM PCI  . Cardiac catheterization  05/19/14    SVG-OM occl- medical Rx  . Left heart catheterization with coronary angiogram N/A 11/01/2011     Procedure: LEFT HEART CATHETERIZATION WITH CORONARY ANGIOGRAM;  Surgeon: Lorretta Harp, MD;  Location: [facility] CATH LAB;  Service: Cardiovascular;  Laterality: N/A;  . Percutaneous coronary stent intervention (pci-s) N/A 11/01/2011    Procedure: PERCUTANEOUS CORONARY STENT INTERVENTION (PCI-S);  Surgeon: Lorretta Harp, MD;  Location: [facility] CATH LAB;  Service: Cardiovascular;  Laterality: N/A;  . Left heart catheterization with coronary/graft angiogram N/A 03/10/2012    Procedure: LEFT HEART CATHETERIZATION WITH [name];  Surgeon: Sherren Mocha, MD;  Location: [facility] CATH LAB;  Service: Cardiovascular;  Laterality: N/A;  . Left heart catheterization with coronary angiogram N/A 06/18/2012    Procedure: LEFT HEART CATHETERIZATION WITH CORONARY ANGIOGRAM;  Surgeon: Peter M Martinique, MD;  Location: [facility] CATH LAB;  Service: Cardiovascular;  Laterality: N/A;  . Percutaneous coronary stent intervention (pci-s)  06/18/2012    Procedure: PERCUTANEOUS CORONARY STENT INTERVENTION (PCI-S);  Surgeon: Peter M Martinique, MD;  Location[facility] CATH LAB;  Service: Cardiovascular;;  . Left heart catheterization with coronary/graft angiogram  10/06/2012    Procedure: LEFT HEART CATHETERIZATION WITH [name];  Surgeon: Burnell Blanks, MD;  Location: [facility] CATH LAB;  Service: Cardiovascular;;  . Percutaneous coronary stent intervention (pci-s)  10/06/2012    Procedure: PERCUTANEOUS CORONARY STENT INTERVENTION (PCI-S);  Surgeon: Burnell Blanks, MD;  Location: [facility] CATH LAB;  Service: Cardiovascular;;  . Left heart catheterization with coronary/graft angiogram N/A 11/09/2012    Procedure: LEFT HEART CATHETERIZATION  WITH [name];  Surgeon: Peter M Martinique, MD;  Location: [facility] CATH LAB;  Service: Cardiovascular;  Laterality: N/A;  . Left heart catheterization with coronary/graft angiogram N/A 05/19/2014    Procedure: LEFT HEART CATHETERIZATION WITH [name];  Surgeon:  Troy Sine, MD;  Location: [facility] CATH LAB;  Service: Cardiovascular;  Laterality: N/A;   Family History  Problem Relation Age of Onset  . Early death      Parents died young  . Appendicitis Mother     Pt was 31 year old  . Heart attack Father 25   Social History  Substance Use Topics  . Smoking status: Former Smoker -- 2.00 packs/day for 10 years    Types: Cigarettes    Start date: 07/14/1950    Quit date: 07/14/1961  . Smokeless tobacco: Current User    Types: Chew  . Alcohol Use: No    Review of Systems  Constitutional: Negative for fever and chills.  HENT: Negative for congestion.   Eyes: Negative for visual disturbance.  Respiratory: Negative for shortness of breath.   Cardiovascular: Negative for chest pain.  Gastrointestinal: Positive for diarrhea. Negative for vomiting and abdominal pain.  Genitourinary: Negative for dysuria and flank pain.  Musculoskeletal: Negative for back pain, neck pain and neck stiffness.  Skin: Negative for rash.  Neurological: Negative for light-headedness and headaches.      Allergies  Review of patient's allergies indicates no known allergies.  Home Medications   Prior to Admission medications   Medication Sig Start Date End Date Taking? Authorizing Provider  acetaminophen (TYLENOL) 325 MG tablet Take 2 tablets (650 mg total) by mouth every 4 (four) hours as needed for headache or mild pain. Patient taking differently: Take 325 mg by mouth 2 (two) times daily as needed for headache or mild pain.  05/21/14   Erlene Quan, PA-C  albuterol (PROVENTIL HFA;VENTOLIN HFA) 108 (90 BASE) MCG/ACT inhaler Inhale 2 puffs into the lungs every 6 (six) hours as needed for wheezing or shortness of breath.    Historical Provider, MD  aspirin EC 81 MG tablet Take 81 mg by mouth daily.    Historical Provider, MD  atorvastatin (LIPITOR) 40 MG tablet Take 1 tablet (40 mg total) by mouth daily at 6 PM. 05/29/13   Liliane Shi, PA-C  bismuth subsalicylate  (PEPTO BISMOL) 262 MG/15ML suspension Take 30 mLs by mouth every 6 (six) hours as needed for indigestion or diarrhea or loose stools.     Historical Provider, MD  BRILINTA 90 MG TABS tablet TAKE 1 TABLET BY MOUTH TWICE DAILY. 05/15/15   Lendon Colonel, NP  carvedilol (COREG) 3.125 MG tablet TAKE 1 TABLET BY MOUTH TWICE DAILY WITH MEALS. (HEART RATE & BLOOD PRESSURE) 03/05/15   Lendon Colonel, NP  Chlorphen-Pseudoephed-APAP (RA SINUS TABLETS EX ST PO) Take 1 tablet by mouth as needed (for sinus).    Historical Provider, MD  famotidine (PEPCID) 20 MG tablet Take 1 tablet (20 mg total) by mouth at bedtime. 03/03/15   Rexene Alberts, MD  furosemide (LASIX) 20 MG tablet Take 20 mg by mouth as needed for fluid. Take 1 tablet as needed if 3 lb weight gain. 05/06/13   Lendon Colonel, NP  isosorbide mononitrate (IMDUR) 30 MG 24 hr tablet Take 1.5 tablets (45 mg total) by mouth daily. 03/03/15   Rexene Alberts, MD  levothyroxine (SYNTHROID, LEVOTHROID) 50 MCG tablet Take 50 mcg by mouth daily before breakfast.  09/08/14   Historical Provider,  MD  loperamide (IMODIUM) 2 MG capsule Take 1 capsule (2 mg total) by mouth 4 (four) times daily as needed for diarrhea or loose stools. 04/01/15   Kristen N Ward, DO  metFORMIN (GLUCOPHAGE) 500 MG tablet Take 2 tablets (1,000 mg total) by mouth 2 (two) times daily with a meal. RESTART ON Sunday, 03/04/15. Patient taking differently: Take 1,000 mg by mouth 2 (two) times daily with a meal.  03/03/15   Rexene Alberts, MD  Multiple Vitamin (MULTIVITAMIN WITH MINERALS) TABS Take 1 tablet by mouth daily.    Historical Provider, MD  nitroGLYCERIN (NITROSTAT) 0.4 MG SL tablet Place 0.4 mg under the tongue every 5 (five) minutes as needed for chest pain.    Historical Provider, MD  polyethylene glycol powder (MIRALAX) powder Take one capful by mouth twice daily until stools are loose 05/14/15   Merryl Hacker, MD  potassium chloride (K-DUR) 10 MEQ tablet Take 10 mEq by mouth as  needed. Takes along with the Furosemide when he has swelling.    Historical Provider, MD  tamsulosin (FLOMAX) 0.4 MG CAPS Take 0.4 mg by mouth daily after supper.  01/27/13   Historical Provider, MD   BP 142/80 mmHg  Pulse 78  Temp(Src) 98.2 F (36.8 C) (Oral)  Resp 16  Ht 5\' 10"  (1.778 m)  Wt 170 lb (77.111 kg)  BMI 24.39 kg/m2  SpO2 100% Physical Exam  Constitutional: He is oriented to person, place, and time. He appears well-developed and well-nourished.  HENT:  Head: Normocephalic and atraumatic.  Mild dry mucous membranes  Eyes: Right eye exhibits no discharge. Left eye exhibits no discharge.  Neck: Normal range of motion. Neck supple. No tracheal deviation present.  Cardiovascular: Normal rate and regular rhythm.   Pulmonary/Chest: Effort normal and breath sounds normal.  Abdominal: Soft. He exhibits no distension. There is no tenderness. There is no guarding.  Musculoskeletal: He exhibits no edema.  Neurological: He is alert and oriented to person, place, and time.  Skin: Skin is warm. No rash noted.  Psychiatric: He has a normal mood and affect.  Nursing note and vitals reviewed.   ED Course  Procedures (including critical care time) Labs Review Labs Reviewed  CBC WITH DIFFERENTIAL/PLATELET - Abnormal; Notable for the following:    RBC 4.02 (*)    HCT 38.3 (*)    Platelets 147 (*)    All other components within normal limits  COMPREHENSIVE METABOLIC PANEL - Abnormal; Notable for the following:    Glucose, Bld 251 (*)    Total Protein 6.2 (*)    All other components within normal limits  URINALYSIS, ROUTINE W REFLEX MICROSCOPIC (NOT AT [facility]) - Abnormal; Notable for the following:    Glucose, UA 250 (*)    Ketones, ur TRACE (*)    All other components within normal limits    Imaging Review No results found. I have personally reviewed and evaluated these images and lab results as part of my medical decision-making.   EKG Interpretation None      MDM    Final diagnoses:  Diarrhea, unspecified type   Well-appearing patient presented with less than 24 hours of diarrhea. Mild dehydration clinically. Oral fluids given small IV fluid bolus given, patient has card and myopathy history. Patient improved on recheck heart rate improved. Discussed outpatient follow-up.  Results and differential diagnosis were discussed with the patient/parent/guardian. Xrays were independently reviewed by myself.  Close follow up outpatient was discussed, comfortable with the plan.   Medications  sodium chloride 0.9 % bolus 500 mL (0 mLs Intravenous Stopped 05/19/15 1540)    Filed Vitals:   05/19/15 1310 05/19/15 1512  BP: 111/56 142/80  Pulse: 102 78  Temp: 98.1 F (36.7 C) 98.2 F (36.8 C)  TempSrc: Oral   Resp: 16 16  Height: 5\' 10"  (1.778 m)   Weight: 170 lb (77.111 kg)   SpO2: 97% 100%    Final diagnoses:  Diarrhea, unspecified type       Elnora Morrison, MD 05/19/15 1546

## 2015-05-19 NOTE — ED Notes (Signed)
Pt states he thinks he is dehydrated because he had diarrhea all night. NAD.

## 2015-05-19 NOTE — ED Notes (Signed)
Pt drank 8 oz water and 8 oz ginger ale.

## 2015-05-20 ENCOUNTER — Emergency Department (HOSPITAL_COMMUNITY)
Admission: EM | Admit: 2015-05-20 | Discharge: 2015-05-20 | Disposition: A | Discharge status: Home / Self Care | Payer: Medicare Other | Attending: Emergency Medicine | Admitting: Emergency Medicine

## 2015-05-20 ENCOUNTER — Encounter (HOSPITAL_COMMUNITY): Payer: Self-pay | Admitting: Emergency Medicine

## 2015-05-20 DIAGNOSIS — Z9889 Other specified postprocedural states: Secondary | ICD-10-CM | POA: Diagnosis not present

## 2015-05-20 DIAGNOSIS — E785 Hyperlipidemia, unspecified: Secondary | ICD-10-CM | POA: Insufficient documentation

## 2015-05-20 DIAGNOSIS — G8929 Other chronic pain: Secondary | ICD-10-CM | POA: Insufficient documentation

## 2015-05-20 DIAGNOSIS — I2510 Atherosclerotic heart disease of native coronary artery without angina pectoris: Secondary | ICD-10-CM | POA: Insufficient documentation

## 2015-05-20 DIAGNOSIS — R0789 Other chest pain: Secondary | ICD-10-CM | POA: Diagnosis not present

## 2015-05-20 DIAGNOSIS — Z8701 Personal history of pneumonia (recurrent): Secondary | ICD-10-CM | POA: Insufficient documentation

## 2015-05-20 DIAGNOSIS — Z8659 Personal history of other mental and behavioral disorders: Secondary | ICD-10-CM | POA: Diagnosis not present

## 2015-05-20 DIAGNOSIS — E119 Type 2 diabetes mellitus without complications: Secondary | ICD-10-CM | POA: Diagnosis not present

## 2015-05-20 DIAGNOSIS — Z8711 Personal history of peptic ulcer disease: Secondary | ICD-10-CM | POA: Diagnosis not present

## 2015-05-20 DIAGNOSIS — Z8673 Personal history of transient ischemic attack (TIA), and cerebral infarction without residual deficits: Secondary | ICD-10-CM | POA: Diagnosis not present

## 2015-05-20 DIAGNOSIS — I10 Essential (primary) hypertension: Secondary | ICD-10-CM | POA: Diagnosis not present

## 2015-05-20 DIAGNOSIS — N400 Enlarged prostate without lower urinary tract symptoms: Secondary | ICD-10-CM | POA: Diagnosis not present

## 2015-05-20 DIAGNOSIS — Z87891 Personal history of nicotine dependence: Secondary | ICD-10-CM | POA: Insufficient documentation

## 2015-05-20 DIAGNOSIS — E039 Hypothyroidism, unspecified: Secondary | ICD-10-CM | POA: Diagnosis not present

## 2015-05-20 DIAGNOSIS — Z9861 Coronary angioplasty status: Secondary | ICD-10-CM | POA: Diagnosis not present

## 2015-05-20 DIAGNOSIS — Z79899 Other long term (current) drug therapy: Secondary | ICD-10-CM | POA: Insufficient documentation

## 2015-05-20 DIAGNOSIS — Z8719 Personal history of other diseases of the digestive system: Secondary | ICD-10-CM | POA: Diagnosis not present

## 2015-05-20 DIAGNOSIS — Z8781 Personal history of (healed) traumatic fracture: Secondary | ICD-10-CM | POA: Insufficient documentation

## 2015-05-20 DIAGNOSIS — M1990 Unspecified osteoarthritis, unspecified site: Secondary | ICD-10-CM | POA: Insufficient documentation

## 2015-05-20 DIAGNOSIS — R079 Chest pain, unspecified: Secondary | ICD-10-CM | POA: Diagnosis present

## 2015-05-20 DIAGNOSIS — Z7982 Long term (current) use of aspirin: Secondary | ICD-10-CM | POA: Diagnosis not present

## 2015-05-20 LAB — BASIC METABOLIC PANEL
Anion gap: 9 (ref 5–15)
BUN: 13 mg/dL (ref 6–20)
CHLORIDE: 105 mmol/L (ref 101–111)
CO2: 24 mmol/L (ref 22–32)
CREATININE: 0.76 mg/dL (ref 0.61–1.24)
Calcium: 9.4 mg/dL (ref 8.9–10.3)
GFR calc Af Amer: >60 mL/min (ref >60)
GFR calc non Af Amer: >60 mL/min (ref >60)
Glucose, Bld: 198 mg/dL — ABNORMAL HIGH (ref 65–99)
Potassium: 3.9 mmol/L (ref 3.5–5.1)
SODIUM: 138 mmol/L (ref 135–145)

## 2015-05-20 LAB — CBC WITH DIFFERENTIAL/PLATELET
Basophils Absolute: 0 10*3/uL (ref 0.0–0.1)
Basophils Relative: 0 %
EOS ABS: 0.3 10*3/uL (ref 0.0–0.7)
Eosinophils Relative: 4 %
HCT: 39.8 % (ref 39.0–52.0)
HEMOGLOBIN: 13.4 g/dL (ref 13.0–17.0)
LYMPHS ABS: 1.5 10*3/uL (ref 0.7–4.0)
Lymphocytes Relative: 22 %
MCH: 31.9 pg (ref 26.0–34.0)
MCHC: 33.7 g/dL (ref 30.0–36.0)
MCV: 94.8 fL (ref 78.0–100.0)
MONOS PCT: 11 %
Monocytes Absolute: 0.7 10*3/uL (ref 0.1–1.0)
NEUTROS PCT: 63 %
Neutro Abs: 4.2 10*3/uL (ref 1.7–7.7)
Platelets: 154 10*3/uL (ref 150–400)
RBC: 4.2 MIL/uL — ABNORMAL LOW (ref 4.22–5.81)
RDW: 12.7 % (ref 11.5–15.5)
WBC: 6.7 10*3/uL (ref 4.0–10.5)

## 2015-05-20 LAB — TROPONIN I: Troponin I: <0.03 ng/mL (ref <0.031)

## 2015-05-20 MED ORDER — ACYCLOVIR 5 % EX OINT: 1.0000 "application " | TOPICAL_OINTMENT | CUTANEOUS | Status: DC

## 2015-05-20 NOTE — ED Notes (Signed)
Cp x 1 hour.

## 2015-05-20 NOTE — Discharge Instructions (Signed)
Follow-up your primary care doctor. °

## 2015-05-26 ENCOUNTER — Emergency Department (HOSPITAL_COMMUNITY): Payer: Medicare Other

## 2015-05-26 ENCOUNTER — Observation Stay (HOSPITAL_COMMUNITY)
Admission: EM | Admit: 2015-05-26 | Discharge: 2015-05-27 | Disposition: A | Discharge status: Home / Self Care | Payer: Medicare Other | Attending: Internal Medicine | Admitting: Internal Medicine

## 2015-05-26 ENCOUNTER — Encounter (HOSPITAL_COMMUNITY): Payer: Self-pay | Admitting: Emergency Medicine

## 2015-05-26 DIAGNOSIS — K219 Gastro-esophageal reflux disease without esophagitis: Secondary | ICD-10-CM | POA: Diagnosis not present

## 2015-05-26 DIAGNOSIS — Z8711 Personal history of peptic ulcer disease: Secondary | ICD-10-CM | POA: Diagnosis not present

## 2015-05-26 DIAGNOSIS — Z8701 Personal history of pneumonia (recurrent): Secondary | ICD-10-CM | POA: Insufficient documentation

## 2015-05-26 DIAGNOSIS — K130 Diseases of lips: Secondary | ICD-10-CM | POA: Diagnosis present

## 2015-05-26 DIAGNOSIS — M1990 Unspecified osteoarthritis, unspecified site: Secondary | ICD-10-CM | POA: Diagnosis not present

## 2015-05-26 DIAGNOSIS — Z8673 Personal history of transient ischemic attack (TIA), and cerebral infarction without residual deficits: Secondary | ICD-10-CM | POA: Diagnosis not present

## 2015-05-26 DIAGNOSIS — I25110 Atherosclerotic heart disease of native coronary artery with unstable angina pectoris: Secondary | ICD-10-CM

## 2015-05-26 DIAGNOSIS — I10 Essential (primary) hypertension: Secondary | ICD-10-CM | POA: Insufficient documentation

## 2015-05-26 DIAGNOSIS — Z79899 Other long term (current) drug therapy: Secondary | ICD-10-CM | POA: Diagnosis not present

## 2015-05-26 DIAGNOSIS — G8929 Other chronic pain: Secondary | ICD-10-CM | POA: Diagnosis not present

## 2015-05-26 DIAGNOSIS — E119 Type 2 diabetes mellitus without complications: Secondary | ICD-10-CM | POA: Diagnosis not present

## 2015-05-26 DIAGNOSIS — Z7982 Long term (current) use of aspirin: Secondary | ICD-10-CM | POA: Insufficient documentation

## 2015-05-26 DIAGNOSIS — E039 Hypothyroidism, unspecified: Secondary | ICD-10-CM | POA: Diagnosis not present

## 2015-05-26 DIAGNOSIS — I2510 Atherosclerotic heart disease of native coronary artery without angina pectoris: Secondary | ICD-10-CM | POA: Diagnosis not present

## 2015-05-26 DIAGNOSIS — F418 Other specified anxiety disorders: Secondary | ICD-10-CM | POA: Insufficient documentation

## 2015-05-26 DIAGNOSIS — E785 Hyperlipidemia, unspecified: Secondary | ICD-10-CM | POA: Diagnosis not present

## 2015-05-26 DIAGNOSIS — I255 Ischemic cardiomyopathy: Secondary | ICD-10-CM

## 2015-05-26 DIAGNOSIS — Z87891 Personal history of nicotine dependence: Secondary | ICD-10-CM | POA: Diagnosis not present

## 2015-05-26 DIAGNOSIS — R079 Chest pain, unspecified: Principal | ICD-10-CM | POA: Diagnosis present

## 2015-05-26 DIAGNOSIS — Z951 Presence of aortocoronary bypass graft: Secondary | ICD-10-CM | POA: Diagnosis not present

## 2015-05-26 DIAGNOSIS — N400 Enlarged prostate without lower urinary tract symptoms: Secondary | ICD-10-CM | POA: Diagnosis not present

## 2015-05-26 LAB — BASIC METABOLIC PANEL
ANION GAP: 8 (ref 5–15)
BUN: 13 mg/dL (ref 6–20)
CO2: 29 mmol/L (ref 22–32)
Calcium: 9.3 mg/dL (ref 8.9–10.3)
Chloride: 101 mmol/L (ref 101–111)
Creatinine, Ser: 0.83 mg/dL (ref 0.61–1.24)
GFR calc Af Amer: >60 mL/min (ref >60)
Glucose, Bld: 206 mg/dL — ABNORMAL HIGH (ref 65–99)
POTASSIUM: 3.8 mmol/L (ref 3.5–5.1)
SODIUM: 138 mmol/L (ref 135–145)

## 2015-05-26 LAB — CBC
HCT: 39.8 % (ref 39.0–52.0)
Hemoglobin: 13.4 g/dL (ref 13.0–17.0)
MCH: 32.1 pg (ref 26.0–34.0)
MCHC: 33.7 g/dL (ref 30.0–36.0)
MCV: 95.4 fL (ref 78.0–100.0)
PLATELETS: 140 10*3/uL — AB (ref 150–400)
RBC: 4.17 MIL/uL — ABNORMAL LOW (ref 4.22–5.81)
RDW: 12.7 % (ref 11.5–15.5)
WBC: 6.4 10*3/uL (ref 4.0–10.5)

## 2015-05-26 LAB — TROPONIN I

## 2015-05-26 MED ORDER — ONDANSETRON HCL 4 MG/2ML IJ SOLN: 4.0000 mg | Freq: Four times a day (QID) | INTRAMUSCULAR | Status: DC | PRN

## 2015-05-26 MED ORDER — ATORVASTATIN CALCIUM 40 MG PO TABS: 40.0000 mg | ORAL_TABLET | Freq: Every day | ORAL | Status: DC

## 2015-05-26 MED ORDER — TICAGRELOR 90 MG PO TABS
90.0000 mg | ORAL_TABLET | Freq: Two times a day (BID) | ORAL | Status: DC
  Administered 2015-05-26 – 2015-05-27 (×2): 90 mg via ORAL
  Filled 2015-05-26 (×4): qty 1

## 2015-05-26 MED ORDER — ISOSORBIDE MONONITRATE ER 30 MG PO TB24
45.0000 mg | ORAL_TABLET | Freq: Every day | ORAL | Status: DC
  Administered 2015-05-27: 45 mg via ORAL
  Filled 2015-05-26: qty 2

## 2015-05-26 MED ORDER — LOPERAMIDE HCL 2 MG PO CAPS: 2.0000 mg | ORAL_CAPSULE | Freq: Four times a day (QID) | ORAL | Status: DC | PRN

## 2015-05-26 MED ORDER — CARVEDILOL 3.125 MG PO TABS
3.1250 mg | ORAL_TABLET | Freq: Two times a day (BID) | ORAL | Status: DC
  Administered 2015-05-27: 3.125 mg via ORAL
  Filled 2015-05-26: qty 1

## 2015-05-26 MED ORDER — TAMSULOSIN HCL 0.4 MG PO CAPS: 0.4000 mg | ORAL_CAPSULE | Freq: Every day | ORAL | Status: DC

## 2015-05-26 MED ORDER — FAMOTIDINE 20 MG PO TABS
20.0000 mg | ORAL_TABLET | Freq: Every day | ORAL | Status: DC
  Administered 2015-05-26: 20 mg via ORAL
  Filled 2015-05-26: qty 1

## 2015-05-26 MED ORDER — LEVOTHYROXINE SODIUM 50 MCG PO TABS
50.0000 ug | ORAL_TABLET | Freq: Every day | ORAL | Status: DC
  Administered 2015-05-27: 50 ug via ORAL
  Filled 2015-05-26: qty 1

## 2015-05-26 MED ORDER — ASPIRIN EC 81 MG PO TBEC
81.0000 mg | DELAYED_RELEASE_TABLET | Freq: Every day | ORAL | Status: DC
  Administered 2015-05-27: 81 mg via ORAL
  Filled 2015-05-26: qty 1

## 2015-05-26 MED ORDER — NITROGLYCERIN 0.4 MG SL SUBL: 0.4000 mg | SUBLINGUAL_TABLET | SUBLINGUAL | Status: DC | PRN

## 2015-05-26 MED ORDER — MORPHINE SULFATE (PF) 2 MG/ML IV SOLN: 2.0000 mg | INTRAVENOUS | Status: DC | PRN

## 2015-05-26 MED ORDER — METFORMIN HCL 500 MG PO TABS
1000.0000 mg | ORAL_TABLET | Freq: Two times a day (BID) | ORAL | Status: DC
  Administered 2015-05-27: 1000 mg via ORAL
  Filled 2015-05-26: qty 2

## 2015-05-26 MED ORDER — BISMUTH SUBSALICYLATE 262 MG/15ML PO SUSP
30.0000 mL | Freq: Four times a day (QID) | ORAL | Status: DC | PRN
  Filled 2015-05-26: qty 118

## 2015-05-26 MED ORDER — ENOXAPARIN SODIUM 40 MG/0.4ML ~~LOC~~ SOLN
40.0000 mg | SUBCUTANEOUS | Status: DC
Start: 2015-05-26 — End: 2015-05-27
  Administered 2015-05-26: 40 mg via SUBCUTANEOUS
  Filled 2015-05-26: qty 0.4

## 2015-05-26 MED ORDER — ALPRAZOLAM 0.5 MG PO TABS
0.5000 mg | ORAL_TABLET | Freq: Every day | ORAL | Status: DC
  Administered 2015-05-26: 0.5 mg via ORAL
  Filled 2015-05-26: qty 1

## 2015-05-26 MED ORDER — ASPIRIN 81 MG PO CHEW
324.0000 mg | CHEWABLE_TABLET | Freq: Once | ORAL | Status: AC
  Administered 2015-05-26: 324 mg via ORAL
  Filled 2015-05-26: qty 4

## 2015-05-26 MED ORDER — ACETAMINOPHEN 325 MG PO TABS: 650.0000 mg | ORAL_TABLET | ORAL | Status: DC | PRN

## 2015-05-26 MED ORDER — ALBUTEROL SULFATE (2.5 MG/3ML) 0.083% IN NEBU: 3.0000 mL | INHALATION_SOLUTION | Freq: Four times a day (QID) | RESPIRATORY_TRACT | Status: DC | PRN

## 2015-05-26 NOTE — ED Provider Notes (Signed)
CSN: [id number]     Arrival date & time 05/26/15  1400 History   First MD Initiated Contact with Patient 05/26/15 1737     Chief Complaint  Patient presents with  . Chest Pain     (Consider location/radiation/quality/duration/timing/severity/associated sxs/prior Treatment) HPI  81 year old male who presents with chest pain. History of CAD status post 4 vessel CABG and 10 coronary stents with ischemic cardiomyopathy EF 45-50% , diabetes, hypertension, hyperlipidemia, and prior stroke. States that he often gets indigestion, but his previous MI's had presented as indigestion. Today after lunch developed burning and pressure to the center of his chest, and states it felt like indigestion. Tried to take Pepto-Bismol, but symptoms were more persistent and pain radiated to the left axilla. He subsequently came to the ED for evaluation. While in the waiting room, all of his symptoms had resolved,  And he is now chest pain-free. Chest discomfort was present for about 1 hour. Denies any associating diaphoresis, nausea or vomiting, syncope or near syncope, severe back pain, lower extremity edema or weight gain, dyspnea on exertion, fatigue with minimal exertion, orthopnea or PND. Denies cough, fever, chills, abd pain.   Past Medical History  Diagnosis Date  . Diabetes mellitus, type II ([place])   . Hyperlipidemia   . Coronary atherosclerosis of native coronary artery     a. CABG x 4 in 1989 (VG->OM1->OM2, VG->RCA, LIMA->LAD), b. 05/2010: DES to VG-OM1/OM2, DES to distal LCx. c. NSTEMI in 04/2011 - TO distal LCX stent and VG->OM2. d. 02/2012 NSTEMI DES to VG-OM1/continuation to OM2 occluded. e. inferior STEMI s/p DES to SVG-RAMUS 06/2012. f. inferolat STEMI 09/2012 s/p DES to SVG-interm; g. Lex MV (11/14):  EF 35%, inf-lat scar with small peri-infarct ischemia  . Essential hypertension, benign   . Osteoarthritis   . History of stroke   . History of pneumonia   . Cervical vertebral fracture ([place])   . Chronic  back pain   . Benign prostatic hypertrophy     History of urinary retention  . Peptic ulcer disease   . Gastroesophageal reflux disease   . Ischemic cardiomyopathy Nov 2015    EF 35% cath, 45-50% by echo  . Hypothyroidism   . Anxiety about health     "multiple somatic complaints"   Past Surgical History  Procedure Laterality Date  . Tonsillectomy    . Coronary artery bypass graft  1989  . Coronary angioplasty  10/12, 8/13, 12/13, 3/14    SVG-OM PCI  . Cardiac catheterization  05/19/14    SVG-OM occl- medical Rx  . Left heart catheterization with coronary angiogram N/A 11/01/2011    Procedure: LEFT HEART CATHETERIZATION WITH CORONARY ANGIOGRAM;  Surgeon: Lorretta Harp, MD;  Location: [facility] CATH LAB;  Service: Cardiovascular;  Laterality: N/A;  . Percutaneous coronary stent intervention (pci-s) N/A 11/01/2011    Procedure: PERCUTANEOUS CORONARY STENT INTERVENTION (PCI-S);  Surgeon: Lorretta Harp, MD;  Location: [facility] CATH LAB;  Service: Cardiovascular;  Laterality: N/A;  . Left heart catheterization with coronary/graft angiogram N/A 03/10/2012    Procedure: LEFT HEART CATHETERIZATION WITH [name];  Surgeon: Sherren Mocha, MD;  Location: [facility] CATH LAB;  Service: Cardiovascular;  Laterality: N/A;  . Left heart catheterization with coronary angiogram N/A 06/18/2012    Procedure: LEFT HEART CATHETERIZATION WITH CORONARY ANGIOGRAM;  Surgeon: Peter M Martinique, MD;  Location: [facility] CATH LAB;  Service: Cardiovascular;  Laterality: N/A;  . Percutaneous coronary stent intervention (pci-s)  06/18/2012    Procedure: PERCUTANEOUS CORONARY STENT INTERVENTION (  PCI-S);  Surgeon: Peter M Martinique, MD;  Location: [facility] CATH LAB;  Service: Cardiovascular;;  . Left heart catheterization with coronary/graft angiogram  10/06/2012    Procedure: LEFT HEART CATHETERIZATION WITH [name];  Surgeon: Burnell Blanks, MD;  Location: [facility] CATH LAB;  Service: Cardiovascular;;  . Percutaneous  coronary stent intervention (pci-s)  10/06/2012    Procedure: PERCUTANEOUS CORONARY STENT INTERVENTION (PCI-S);  Surgeon: Burnell Blanks, MD;  Location: [facility] CATH LAB;  Service: Cardiovascular;;  . Left heart catheterization with coronary/graft angiogram N/A 11/09/2012    Procedure: LEFT HEART CATHETERIZATION WITH [name];  Surgeon: Peter M Martinique, MD;  Location: [facility] CATH LAB;  Service: Cardiovascular;  Laterality: N/A;  . Left heart catheterization with coronary/graft angiogram N/A 05/19/2014    Procedure: LEFT HEART CATHETERIZATION WITH [name];  Surgeon: Troy Sine, MD;  Location: [facility] CATH LAB;  Service: Cardiovascular;  Laterality: N/A;   Family History  Problem Relation Age of Onset  . Early death      Parents died young  . Appendicitis Mother     Pt was 25 year old  . Heart attack Father 33   Social History  Substance Use Topics  . Smoking status: Former Smoker -- 2.00 packs/day for 10 years    Types: Cigarettes    Start date: 07/14/1950    Quit date: 07/14/1961  . Smokeless tobacco: Current User    Types: Chew  . Alcohol Use: No    Review of Systems 10/14 systems reviewed and are negative other than those stated in the HPI    Allergies  Review of patient's allergies indicates no known allergies.  Home Medications   Prior to Admission medications   Medication Sig Start Date End Date Taking? Authorizing Provider  acetaminophen (TYLENOL) 325 MG tablet Take 2 tablets (650 mg total) by mouth every 4 (four) hours as needed for headache or mild pain. Patient taking differently: Take 325 mg by mouth 2 (two) times daily as needed for headache or mild pain.  05/21/14  Yes Luke K Kilroy, PA-C  acyclovir ointment (ZOVIRAX) 5 % Apply 1 application topically every 3 (three) hours. Apply to lip tid 05/20/15  Yes [name], MD  albuterol (PROVENTIL HFA;VENTOLIN HFA) 108 (90 BASE) MCG/ACT inhaler Inhale 2 puffs into the lungs every 6 (six) hours as  needed for wheezing or shortness of breath.   Yes Historical Provider, MD  ALPRAZolam Duanne Moron) 0.5 MG tablet Take 0.5 mg by mouth at bedtime.   Yes Historical Provider, MD  aspirin EC 81 MG tablet Take 81 mg by mouth daily.   Yes Historical Provider, MD  atorvastatin (LIPITOR) 40 MG tablet Take 1 tablet (40 mg total) by mouth daily at 6 PM. 05/29/13  Yes Scott T Kathlen Mody, PA-C  bismuth subsalicylate (PEPTO BISMOL) 262 MG/15ML suspension Take 30 mLs by mouth every 6 (six) hours as needed for indigestion or diarrhea or loose stools.    Yes Historical Provider, MD  BRILINTA 90 MG TABS tablet TAKE 1 TABLET BY MOUTH TWICE DAILY. 05/15/15  Yes Lendon Colonel, NP  carvedilol (COREG) 3.125 MG tablet TAKE 1 TABLET BY MOUTH TWICE DAILY WITH MEALS. (HEART RATE & BLOOD PRESSURE) 03/05/15  Yes Lendon Colonel, NP  famotidine (PEPCID) 20 MG tablet Take 1 tablet (20 mg total) by mouth at bedtime. 03/03/15  Yes Rexene Alberts, MD  furosemide (LASIX) 20 MG tablet Take 20 mg by mouth as needed for fluid. Take 1 tablet as needed if 3 lb  weight gain. 05/06/13  Yes Lendon Colonel, NP  isosorbide mononitrate (IMDUR) 30 MG 24 hr tablet Take 1.5 tablets (45 mg total) by mouth daily. 03/03/15  Yes Rexene Alberts, MD  levothyroxine (SYNTHROID, LEVOTHROID) 50 MCG tablet Take 50 mcg by mouth daily before breakfast.  09/08/14  Yes Historical Provider, MD  loperamide (IMODIUM) 2 MG capsule Take 1 capsule (2 mg total) by mouth 4 (four) times daily as needed for diarrhea or loose stools. 04/01/15  Yes Kristen N Ward, DO  metFORMIN (GLUCOPHAGE) 500 MG tablet Take 2 tablets (1,000 mg total) by mouth 2 (two) times daily with a meal. RESTART ON Sunday, 03/04/15. Patient taking differently: Take 1,000 mg by mouth 2 (two) times daily with a meal.  03/03/15  Yes Rexene Alberts, MD  Multiple Vitamin (MULTIVITAMIN WITH MINERALS) TABS Take 1 tablet by mouth daily.   Yes Historical Provider, MD  nitroGLYCERIN (NITROSTAT) 0.4 MG SL tablet Place  0.4 mg under the tongue every 5 (five) minutes as needed for chest pain.   Yes Historical Provider, MD  potassium chloride (K-DUR) 10 MEQ tablet Take 10 mEq by mouth as needed. Takes along with the Furosemide when he has swelling.   Yes Historical Provider, MD  tamsulosin (FLOMAX) 0.4 MG CAPS Take 0.4 mg by mouth daily after supper.  01/27/13  Yes Historical Provider, MD   BP 141/73 mmHg  Pulse 84  Temp(Src) 97.8 F (36.6 C) (Oral)  Resp 20  Ht 5\' 10"  (1.778 m)  Wt 170 lb (77.111 kg)  BMI 24.39 kg/m2  SpO2 97% Physical Exam Physical Exam  Nursing note and vitals reviewed. Constitutional: Elderly appearing man, well developed, well nourished, non-toxic, and in no acute distress Head: Normocephalic and atraumatic.  Mouth/Throat: Oropharynx is clear and moist.  Neck: Normal range of motion. Neck supple.  Cardiovascular: Normal rate and regular rhythm.   Pulmonary/Chest: Effort normal and breath sounds normal. No chest wall tenderness. Abdominal: Soft. There is no tenderness. There is no rebound and no guarding.  Musculoskeletal: Normal range of motion.  Neurological: Alert, no facial droop, fluent speech, moves all extremities symmetrically Skin: Skin is warm and dry.  Psychiatric: Cooperative  ED Course  Procedures (including critical care time) Labs Review Labs Reviewed  CBC - Abnormal; Notable for the following:    RBC 4.17 (*)    Platelets 140 (*)    All other components within normal limits  BASIC METABOLIC PANEL - Abnormal; Notable for the following:    Glucose, Bld 206 (*)    All other components within normal limits  GLUCOSE, CAPILLARY - Abnormal; Notable for the following:    Glucose-Capillary 206 (*)    All other components within normal limits  TROPONIN I  TROPONIN I  TROPONIN I  TROPONIN I    Imaging Review Dg Chest 2 View  05/26/2015  CLINICAL DATA:  Chest pain beginning today. No known injury. Initial encounter. EXAM: CHEST  2 VIEW COMPARISON:  PA and  lateral chest 05/06/2015. Single view of the chest 06/01/2014. FINDINGS: The patient is status post CABG. The lungs are clear. Heart size is normal. No pneumothorax or pleural effusion. Aortic atherosclerosis is noted. IMPRESSION: No acute disease. Atherosclerosis. Electronically Signed   By: Inge Rise M.D.   On: 05/26/2015 18:34   I have personally reviewed and evaluated these images and lab results as part of my medical decision-making.   EKG Interpretation   Date/Time:  Saturday May 26 2015 14:07:05 EST Ventricular Rate:  96 PR  Interval:  176 QRS Duration: 106 QT Interval:  388 QTC Calculation: 490 R Axis:   85 Text Interpretation:  Normal sinus rhythm ST \\T \ T wave abnormality,  consider inferolateral ischemia Prolonged QT No significant change since  last tracing Confirmed by LIU MD, DANA [id number]) on 05/26/2015 5:40:25 PM      MDM   Final diagnoses:  Chest pain, unspecified chest pain type  S/P CABG x 4  Ischemic cardiomyopathy    In short, this is an 81 year old male with  Extensive cardiac history who presents with indigestion-like chest discomfort.  Chest pain-free on arrival. Vital signs are non-concerning, and his cardiopulmonary exam is unremarkable. EKG with more prominent T waves in the anterior leads, and he also has inferolateral ischemia. However this is not change from prior EKG. Troponin 1 is negative. Chest x-ray shows no acute cardiopulmonary processes. Given his age and multiple risk factors, he is high risk for major adverse cardiac events.  Despite the fact that he also has reflux disease, this is also his anginal equivalent. Given a full dose of aspirin. Remained chest pain-free. Will admit for cardiac evaluation.    Forde Dandy, MD 05/27/15 [phone]

## 2015-05-26 NOTE — ED Notes (Signed)
MD at bedside. 

## 2015-05-26 NOTE — ED Notes (Signed)
Pt with increased chest pain that started 2 hrs ago while he was lying down.

## 2015-05-26 NOTE — H&P (Signed)
History and Physical  Jeffrey [name] [id number] DOB: 06-Apr-1929 DOA: 05/26/2015  Referring physician: Dr Oleta Mouse, ED physician PCP: [name]., MD   Chief Complaint: Chest pain  HPI: Jeffrey [name] is a 81 y.o. male  With a history of diabetes, hyperlipidemia, essential hypertension, BPH, ischemic cardiomyopathy, coronary artery disease with CABG 4 vessels in 1989 with 10 stents, chronic systolic heart failure with an EF of 45-50% with grade 1 diastolic dysfunction. Patient had onset of lower chest/epigastric discomfort which she related as heartburn which radiated into his left midaxillary line.  This discomfort started shortly after eating around 12 to 12:30. The patient took some Pepto-Bismol, which he usually does for his dyspepsia. After about 20-30 minutes his pain resolved. Due to the patient's history, the patient got concerned and presented to the hospital for evaluation. The patient relates that he's had a few episodes of similar feelings which led to PCI with stenting. In the emergency department, the patient is pain-free. The emergency room doctor consulted with cardiology in [place], who recommended admission for rule out.    Review of Systems:   Pt denies any fevers, chills, nausea, vomiting, diarrhea, constipation, abdominal pain, shortness of breath, dyspnea on exertion, orthopnea, cough, wheezing, palpitations, headache, vision changes, lightheadedness, dizziness, diarrhea, constipation, melena, rectal bleeding.  Review of systems are otherwise negative  Past Medical History  Diagnosis Date  . Diabetes mellitus, type II ([place])   . Hyperlipidemia   . Coronary atherosclerosis of native coronary artery     a. CABG x 4 in 1989 (VG->OM1->OM2, VG->RCA, LIMA->LAD), b. 05/2010: DES to VG-OM1/OM2, DES to distal LCx. c. NSTEMI in 04/2011 - TO distal LCX stent and VG->OM2. d. 02/2012 NSTEMI DES to VG-OM1/continuation to OM2 occluded. e. inferior STEMI s/p DES to SVG-RAMUS  06/2012. f. inferolat STEMI 09/2012 s/p DES to SVG-interm; g. Lex MV (11/14):  EF 35%, inf-lat scar with small peri-infarct ischemia  . Essential hypertension, benign   . Osteoarthritis   . History of stroke   . History of pneumonia   . Cervical vertebral fracture ([place])   . Chronic back pain   . Benign prostatic hypertrophy     History of urinary retention  . Peptic ulcer disease   . Gastroesophageal reflux disease   . Ischemic cardiomyopathy Nov 2015    EF 35% cath, 45-50% by echo  . Hypothyroidism   . Anxiety about health     "multiple somatic complaints"   Past Surgical History  Procedure Laterality Date  . Tonsillectomy    . Coronary artery bypass graft  1989  . Coronary angioplasty  10/12, 8/13, 12/13, 3/14    SVG-OM PCI  . Cardiac catheterization  05/19/14    SVG-OM occl- medical Rx  . Left heart catheterization with coronary angiogram N/A 11/01/2011    Procedure: LEFT HEART CATHETERIZATION WITH CORONARY ANGIOGRAM;  Surgeon: Lorretta Harp, MD;  Location: [facility] CATH LAB;  Service: Cardiovascular;  Laterality: N/A;  . Percutaneous coronary stent intervention (pci-s) N/A 11/01/2011    Procedure: PERCUTANEOUS CORONARY STENT INTERVENTION (PCI-S);  Surgeon: Lorretta Harp, MD;  Location: [facility] CATH LAB;  Service: Cardiovascular;  Laterality: N/A;  . Left heart catheterization with coronary/graft angiogram N/A 03/10/2012    Procedure: LEFT HEART CATHETERIZATION WITH [name];  Surgeon: Sherren Mocha, MD;  Location: [facility] CATH LAB;  Service: Cardiovascular;  Laterality: N/A;  . Left heart catheterization with coronary angiogram N/A 06/18/2012    Procedure: LEFT HEART CATHETERIZATION WITH CORONARY ANGIOGRAM;  Surgeon: Ander Slade  Martinique, MD;  Location: [facility] CATH LAB;  Service: Cardiovascular;  Laterality: N/A;  . Percutaneous coronary stent intervention (pci-s)  06/18/2012    Procedure: PERCUTANEOUS CORONARY STENT INTERVENTION (PCI-S);  Surgeon: Peter M Martinique, MD;  Location: [facility] CATH  LAB;  Service: Cardiovascular;;  . Left heart catheterization with coronary/graft angiogram  10/06/2012    Procedure: LEFT HEART CATHETERIZATION WITH [name];  Surgeon: Burnell Blanks, MD;  Location: [facility] CATH LAB;  Service: Cardiovascular;;  . Percutaneous coronary stent intervention (pci-s)  10/06/2012    Procedure: PERCUTANEOUS CORONARY STENT INTERVENTION (PCI-S);  Surgeon: Burnell Blanks, MD;  Location: [facility] CATH LAB;  Service: Cardiovascular;;  . Left heart catheterization with coronary/graft angiogram N/A 11/09/2012    Procedure: LEFT HEART CATHETERIZATION WITH [name];  Surgeon: Peter M Martinique, MD;  Location: [facility] CATH LAB;  Service: Cardiovascular;  Laterality: N/A;  . Left heart catheterization with coronary/graft angiogram N/A 05/19/2014    Procedure: LEFT HEART CATHETERIZATION WITH [name];  Surgeon: Troy Sine, MD;  Location: [facility] CATH LAB;  Service: Cardiovascular;  Laterality: N/A;   Social History:  reports that he quit smoking about 53 years ago. His smoking use included Cigarettes. He started smoking about 64 years ago. He has a 20 pack-year smoking history. His smokeless tobacco use includes Chew. He reports that he does not drink alcohol. His drug history is not on file. Patient lives at home & is able to participate in activities of daily living  No Known Allergies  Family History  Problem Relation Age of Onset  . Early death      Parents died young  . Appendicitis Mother     Pt was 35 year old  . Heart attack Father 1      Prior to Admission medications   Medication Sig Start Date End Date Taking? Authorizing Provider  acetaminophen (TYLENOL) 325 MG tablet Take 2 tablets (650 mg total) by mouth every 4 (four) hours as needed for headache or mild pain. Patient taking differently: Take 325 mg by mouth 2 (two) times daily as needed for headache or mild pain.  05/21/14  Yes Luke K Kilroy, PA-C  acyclovir ointment  (ZOVIRAX) 5 % Apply 1 application topically every 3 (three) hours. Apply to lip tid 05/20/15  Yes [name], MD  albuterol (PROVENTIL HFA;VENTOLIN HFA) 108 (90 BASE) MCG/ACT inhaler Inhale 2 puffs into the lungs every 6 (six) hours as needed for wheezing or shortness of breath.   Yes Historical Provider, MD  ALPRAZolam Duanne Moron) 0.5 MG tablet Take 0.5 mg by mouth at bedtime.   Yes Historical Provider, MD  aspirin EC 81 MG tablet Take 81 mg by mouth daily.   Yes Historical Provider, MD  atorvastatin (LIPITOR) 40 MG tablet Take 1 tablet (40 mg total) by mouth daily at 6 PM. 05/29/13  Yes Scott T Kathlen Mody, PA-C  bismuth subsalicylate (PEPTO BISMOL) 262 MG/15ML suspension Take 30 mLs by mouth every 6 (six) hours as needed for indigestion or diarrhea or loose stools.    Yes Historical Provider, MD  BRILINTA 90 MG TABS tablet TAKE 1 TABLET BY MOUTH TWICE DAILY. 05/15/15  Yes Lendon Colonel, NP  carvedilol (COREG) 3.125 MG tablet TAKE 1 TABLET BY MOUTH TWICE DAILY WITH MEALS. (HEART RATE & BLOOD PRESSURE) 03/05/15  Yes Lendon Colonel, NP  famotidine (PEPCID) 20 MG tablet Take 1 tablet (20 mg total) by mouth at bedtime. 03/03/15  Yes Rexene Alberts, MD  furosemide (LASIX) 20 MG tablet Take  20 mg by mouth as needed for fluid. Take 1 tablet as needed if 3 lb weight gain. 05/06/13  Yes Lendon Colonel, NP  isosorbide mononitrate (IMDUR) 30 MG 24 hr tablet Take 1.5 tablets (45 mg total) by mouth daily. 03/03/15  Yes Rexene Alberts, MD  levothyroxine (SYNTHROID, LEVOTHROID) 50 MCG tablet Take 50 mcg by mouth daily before breakfast.  09/08/14  Yes Historical Provider, MD  loperamide (IMODIUM) 2 MG capsule Take 1 capsule (2 mg total) by mouth 4 (four) times daily as needed for diarrhea or loose stools. 04/01/15  Yes Kristen N Ward, DO  metFORMIN (GLUCOPHAGE) 500 MG tablet Take 2 tablets (1,000 mg total) by mouth 2 (two) times daily with a meal. RESTART ON Sunday, 03/04/15. Patient taking differently: Take 1,000 mg by  mouth 2 (two) times daily with a meal.  03/03/15  Yes Rexene Alberts, MD  Multiple Vitamin (MULTIVITAMIN WITH MINERALS) TABS Take 1 tablet by mouth daily.   Yes Historical Provider, MD  nitroGLYCERIN (NITROSTAT) 0.4 MG SL tablet Place 0.4 mg under the tongue every 5 (five) minutes as needed for chest pain.   Yes Historical Provider, MD  potassium chloride (K-DUR) 10 MEQ tablet Take 10 mEq by mouth as needed. Takes along with the Furosemide when he has swelling.   Yes Historical Provider, MD  tamsulosin (FLOMAX) 0.4 MG CAPS Take 0.4 mg by mouth daily after supper.  01/27/13  Yes Historical Provider, MD    Physical Exam: BP 154/82 mmHg  Pulse 73  Temp(Src) 97.4 F (36.3 C) (Oral)  Resp 15  Ht 5\' 10"  (1.778 m)  Wt 77.111 kg (170 lb)  BMI 24.39 kg/m2  SpO2 100%  General: Elderly Caucasian male. Awake and alert and oriented x3. No acute cardiopulmonary distress.  Eyes: Pupils equal, round, reactive to light. Extraocular muscles are intact. Sclerae anicteric and noninjected.  ENT:  Moist mucosal membranes. There is a 1 cm slightly raised mass on the patient's left lower lip that has an eschar. Neck: Neck supple without lymphadenopathy. No carotid bruits. No masses palpated.  Cardiovascular: Regular rate with normal S1-S2 sounds. No murmurs, rubs, gallops auscultated. No JVD.  Respiratory: Good respiratory effort with no wheezes, rales, rhonchi. Lungs clear to auscultation bilaterally.  Abdomen: Soft, nontender, nondistended. Active bowel sounds. No masses or hepatosplenomegaly  Skin: Dry, warm to touch. 2+ dorsalis pedis and radial pulses. Musculoskeletal: No calf or leg pain. All major joints not erythematous nontender.  Psychiatric: Intact judgment and insight.  Neurologic: No focal neurological deficits. Cranial nerves II through XII are grossly intact.           Labs on Admission:  Basic Metabolic Panel:  Recent Labs Lab 05/20/15 1505 05/26/15 1703  NA 138 138  K 3.9 3.8  CL 105  101  CO2 24 29  GLUCOSE 198* 206*  BUN 13 13  CREATININE 0.76 0.83  CALCIUM 9.4 9.3   Liver Function Tests: No results for input(s): AST, ALT, ALKPHOS, BILITOT, PROT, ALBUMIN in the last 168 hours. No results for input(s): LIPASE, AMYLASE in the last 168 hours. No results for input(s): AMMONIA in the last 168 hours. CBC:  Recent Labs Lab 05/20/15 1505 05/26/15 1703  WBC 6.7 6.4  NEUTROABS 4.2  --   HGB 13.4 13.4  HCT 39.8 39.8  MCV 94.8 95.4  PLT 154 140*   Cardiac Enzymes:  Recent Labs Lab 05/20/15 1505 05/26/15 1703  TROPONINI <0.03 <0.03    BNP (last 3 results)  Recent Labs  05/02/15 1230  BNP 217.0*    ProBNP (last 3 results) No results for input(s): PROBNP in the last 8760 hours.  CBG: No results for input(s): GLUCAP in the last 168 hours.  Radiological Exams on Admission: Dg Chest 2 View  05/26/2015  CLINICAL DATA:  Chest pain beginning today. No known injury. Initial encounter. EXAM: CHEST  2 VIEW COMPARISON:  PA and lateral chest 05/06/2015. Single view of the chest 06/01/2014. FINDINGS: The patient is status post CABG. The lungs are clear. Heart size is normal. No pneumothorax or pleural effusion. Aortic atherosclerosis is noted. IMPRESSION: No acute disease. Atherosclerosis. Electronically Signed   By: Inge Rise M.D.   On: 05/26/2015 18:34    EKG: Independently reviewed. Normal sinus rhythm with a ventricular rate of 96. Normal intervals. There is ST depression in the lateral leads.  Assessment/Plan Present on Admission:  . Chest pain . Coronary atherosclerosis of native coronary artery . Lip mass  This patient was discussed with the ED physician, including pertinent vitals, physical exam findings, labs, and imaging.  We also discussed care given by the ED provider.  #1 chest pain  Observation with telemetry.  Rule out with serial troponins  Although the patient is high risk, this is unlikely to be cardiac in origin. Even so, I did  recommend the patient be transferred to [facility] given the severity of his risk. The patient declined transportation at the present time. #2 coronary artery disease  Continue Lipitor, aspirin, carvedilol #3 lip Mass.  I'm concerned that this represents cancer. I did discuss this with the patient and recommended that he follow-up with his primary care physician for referral for biopsy #4 diabetes  Continue metformin  DVT prophylaxis: Lovenox  Consultants: None  Code Status: Full code  Family Communication: None   Disposition Plan: Observation with telemetry   Truett Mainland, DO [facility] Pager [phone]

## 2015-05-27 ENCOUNTER — Observation Stay (HOSPITAL_BASED_OUTPATIENT_CLINIC_OR_DEPARTMENT_OTHER): Payer: Medicare Other

## 2015-05-27 DIAGNOSIS — K219 Gastro-esophageal reflux disease without esophagitis: Secondary | ICD-10-CM | POA: Diagnosis not present

## 2015-05-27 DIAGNOSIS — R0789 Other chest pain: Secondary | ICD-10-CM

## 2015-05-27 DIAGNOSIS — I2510 Atherosclerotic heart disease of native coronary artery without angina pectoris: Secondary | ICD-10-CM | POA: Diagnosis not present

## 2015-05-27 DIAGNOSIS — R079 Chest pain, unspecified: Secondary | ICD-10-CM

## 2015-05-27 DIAGNOSIS — K130 Diseases of lips: Secondary | ICD-10-CM | POA: Diagnosis not present

## 2015-05-27 LAB — GLUCOSE, CAPILLARY: GLUCOSE-CAPILLARY: 206 mg/dL — AB (ref 65–99)

## 2015-05-27 LAB — TROPONIN I

## 2015-05-27 MED ORDER — GI COCKTAIL ~~LOC~~: 30.0000 mL | Freq: Three times a day (TID) | ORAL | Status: DC | PRN

## 2015-05-27 MED ORDER — TICAGRELOR 90 MG PO TABS
ORAL_TABLET | ORAL | Status: AC
  Filled 2015-05-27: qty 1

## 2015-05-27 NOTE — Progress Notes (Signed)
Pt ate a health choice meal upon arrival on floor.  He stated that he could not chew the meal he received in the ED,

## 2015-05-27 NOTE — Progress Notes (Signed)
Echocardiogram 2D Echocardiogram has been performed.  Jeffrey [name] 05/27/2015, 12:29 PM

## 2015-05-27 NOTE — Discharge Summary (Signed)
Physician Discharge Summary  Jeffrey [name] [id number] DOB: 08/21/1928 DOA: 05/26/2015  PCP: [name]., MD  Admit date: 05/26/2015 Discharge date: 05/27/2015  Time spent: 20 minutes  Recommendations for Outpatient Follow-up:  1. Follow up with PCP in 2-3 weeks   Discharge Diagnoses:  Principal Problem:   GERD (gastroesophageal reflux disease) Active Problems:   Diabetes mellitus, type II ([place])   Coronary atherosclerosis of native coronary artery   Chest pain   Lip mass   Discharge Condition: Stable  Diet recommendation: Diabetic, heart healthy  Filed Weights   05/26/15 1406  Weight: 77.111 kg (170 lb)    History of present illness:  Please review dictated H and P from 11/12 for details. Briefly, 81 y.o. male with a history of diabetes, hyperlipidemia, essential hypertension, BPH, ischemic cardiomyopathy, coronary artery disease with CABG 4 vessels in 1989 with 10 stents, chronic systolic heart failure with an EF of 45-50% with grade 1 diastolic dysfunction who presents with complaints of indigestion, reminiscent of pt's prior cardiac chest pains.  Hospital Course:  #1 chest pain  Patient was admitted to Observation with telemetry.  Serial troponins were negative x 4  2d echo was obtained and appeared essentially unchanged from recent 8/16 2d echo #2 coronary artery disease  Continued Lipitor, aspirin, carvedilol #3 lip Mass.  Patient recommended to follow-up with his primary care physician for referral for biopsy #4 diabetes  Continued metformin #5 Indigestion  Pt to continue H2 blocker as per home regimen  If further issues with indigestion in the future, would consider trial of PPI and/or GI consultation as outpatient  Procedures:  2d echo  Discharge Exam: Filed Vitals:   05/26/15 2130 05/27/15 0000 05/27/15 0018 05/27/15 0720  BP: 167/93 152/74  141/73  Pulse: 83 82 86 84  Temp:  98 F (36.7 C)  97.8 F (36.6 C)  TempSrc:  Oral   Oral  Resp: 19 18 20 20   Height:      Weight:      SpO2: 96% 97%  97%    General: Awake, in nad Cardiovascular: regular, s1, s2 Respiratory: normal resp effort, no wheezing  Discharge Instructions     Medication List    TAKE these medications        acetaminophen 325 MG tablet  Commonly known as:  TYLENOL  Take 2 tablets (650 mg total) by mouth every 4 (four) hours as needed for headache or mild pain.     acyclovir ointment 5 %  Commonly known as:  ZOVIRAX  Apply 1 application topically every 3 (three) hours. Apply to lip tid     albuterol 108 (90 BASE) MCG/ACT inhaler  Commonly known as:  PROVENTIL HFA;VENTOLIN HFA  Inhale 2 puffs into the lungs every 6 (six) hours as needed for wheezing or shortness of breath.     ALPRAZolam 0.5 MG tablet  Commonly known as:  XANAX  Take 0.5 mg by mouth at bedtime.     aspirin EC 81 MG tablet  Take 81 mg by mouth daily.     atorvastatin 40 MG tablet  Commonly known as:  LIPITOR  Take 1 tablet (40 mg total) by mouth daily at 6 PM.     bismuth subsalicylate 99991111 [id number] suspension  Commonly known as:  PEPTO BISMOL  Take 30 mLs by mouth every 6 (six) hours as needed for indigestion or diarrhea or loose stools.     BRILINTA 90 MG Tabs tablet  Generic drug:  ticagrelor  TAKE 1  TABLET BY MOUTH TWICE DAILY.     carvedilol 3.125 MG tablet  Commonly known as:  COREG  TAKE 1 TABLET BY MOUTH TWICE DAILY WITH MEALS. (HEART RATE & BLOOD PRESSURE)     famotidine 20 MG tablet  Commonly known as:  PEPCID  Take 1 tablet (20 mg total) by mouth at bedtime.     furosemide 20 MG tablet  Commonly known as:  LASIX  Take 20 mg by mouth as needed for fluid. Take 1 tablet as needed if 3 lb weight gain.     isosorbide mononitrate 30 MG 24 hr tablet  Commonly known as:  IMDUR  Take 1.5 tablets (45 mg total) by mouth daily.     levothyroxine 50 MCG tablet  Commonly known as:  SYNTHROID, LEVOTHROID  Take 50 mcg by mouth daily before breakfast.      loperamide 2 MG capsule  Commonly known as:  IMODIUM  Take 1 capsule (2 mg total) by mouth 4 (four) times daily as needed for diarrhea or loose stools.     metFORMIN 500 MG tablet  Commonly known as:  GLUCOPHAGE  Take 2 tablets (1,000 mg total) by mouth 2 (two) times daily with a meal. RESTART ON Sunday, 03/04/15.     multivitamin with minerals Tabs tablet  Take 1 tablet by mouth daily.     nitroGLYCERIN 0.4 MG SL tablet  Commonly known as:  NITROSTAT  Place 0.4 mg under the tongue every 5 (five) minutes as needed for chest pain.     potassium chloride 10 MEQ tablet  Commonly known as:  K-DUR  Take 10 mEq by mouth as needed. Takes along with the Furosemide when he has swelling.     tamsulosin 0.4 MG Caps capsule  Commonly known as:  FLOMAX  Take 0.4 mg by mouth daily after supper.       No Known Allergies Follow-up Information    Follow up with [name]., MD. Schedule an appointment as soon as possible for a visit in 2 weeks.   Specialty:  Internal Medicine   Why:  Hospital follow up   Contact information:   [address] [place] [id number] [phone]        The results of significant diagnostics from this hospitalization (including imaging, microbiology, ancillary and laboratory) are listed below for reference.    Significant Diagnostic Studies: Dg Chest 2 View  05/26/2015  CLINICAL DATA:  Chest pain beginning today. No known injury. Initial encounter. EXAM: CHEST  2 VIEW COMPARISON:  PA and lateral chest 05/06/2015. Single view of the chest 06/01/2014. FINDINGS: The patient is status post CABG. The lungs are clear. Heart size is normal. No pneumothorax or pleural effusion. Aortic atherosclerosis is noted. IMPRESSION: No acute disease. Atherosclerosis. Electronically Signed   By: Inge Rise M.D.   On: 05/26/2015 18:34   Dg Chest 2 View  05/06/2015  CLINICAL DATA:  Chest pain EXAM: CHEST  2 VIEW COMPARISON:  05/04/2015 FINDINGS: Lungs are  clear.  No pleural effusion or pneumothorax. Heart is normal in size. Postsurgical changes related to prior CABG. Degenerative changes of the visualized thoracolumbar spine. IMPRESSION: No evidence of acute cardiopulmonary disease. Electronically Signed   By: Julian Hy M.D.   On: 05/06/2015 13:46   Dg Chest 2 View  05/02/2015  CLINICAL DATA:  Chest pain EXAM: CHEST  2 VIEW COMPARISON:  04/27/2015 FINDINGS: No cardiomegaly. Stable aortic contours. The patient is status post CABG. There is no edema, consolidation, effusion, or  pneumothorax. No acute osseous findings. IMPRESSION: No active cardiopulmonary disease. Electronically Signed   By: Monte Fantasia M.D.   On: 05/02/2015 14:27   Ct Abdomen Pelvis W Contrast  05/04/2015  CLINICAL DATA:  Abdominal pain, constipation, last bowel movement 3 days ago, diabetes mellitus, stroke, essential benign hypertension, coronary artery disease, hyperlipidemia, GERD, history peptic ulcer disease EXAM: CT ABDOMEN AND PELVIS WITH CONTRAST TECHNIQUE: Multidetector CT imaging of the abdomen and pelvis was performed using the standard protocol following bolus administration of intravenous contrast. Sagittal and coronal MPR images reconstructed from axial data set. CONTRAST:  31mL OMNIPAQUE IOHEXOL 300 MG/ML SOLN, 131mL OMNIPAQUE IOHEXOL 300 MG/ML SOLN COMPARISON:  03/02/2015 FINDINGS: Minimal bibasilar atelectasis. Scattered atherosclerotic calcifications aorta and coronary arteries with postsurgical changes of median sternotomy. Liver, gallbladder, spleen, pancreas, kidneys, and adrenal glands normal appearance. Normal appendix. Minimal sigmoid diverticulosis. Stomach and remaining bowel loops normal appearance. Specifically, no overt abnormal retained stool burden identified. Normal appearing bladder and ureters. Marked prostatic enlargement, gland 6.8 x 6.8 x 7.5 cm image 83. No mass, adenopathy, free air, free fluid or inflammatory process. Old fractures RIGHT  lateral eighth and ninth ribs. Degenerative disc disease changes thoracolumbar spine. IMPRESSION: No acute intra-abdominal or intrapelvic abnormalities. Minimal sigmoid diverticulosis. Scattered atherosclerotic disease. Marked prostatic enlargement. Electronically Signed   By: Lavonia Dana M.D.   On: 05/04/2015 19:38   Dg Abd Acute W/chest  05/14/2015  CLINICAL DATA:  Cramping intermittent low abdominal pain. EXAM: DG ABDOMEN ACUTE W/ 1V CHEST COMPARISON:  None. FINDINGS: There is a large volume colonic stool. The bowel gas pattern is negative for obstruction or perforation. No biliary or urinary calculi are evident. Upright view of the chest is negative for significant cardiopulmonary abnormality is unchanged from 05/06/2015. There is prior sternotomy and CABG. IMPRESSION: Negative for obstruction or perforation. Large volume colonic stool. No acute cardiopulmonary finding. Electronically Signed   By: Andreas Newport M.D.   On: 05/14/2015 06:50   Dg Abd Acute W/chest  05/04/2015  CLINICAL DATA:  Generalized weakness since waking this morning. EXAM: DG ABDOMEN ACUTE W/ 1V CHEST COMPARISON:  05/02/2015 FINDINGS: Prior CABG. Heart and mediastinal contours are within normal limits. No focal opacities or effusions. No acute bony abnormality. Large stool burden throughout the colon. Nonobstructive bowel gas pattern. No free air organomegaly. Mild leftward scoliosis in the lumbar spine. No acute bony abnormality. IMPRESSION: Large stool burden throughout the colon. No obstruction or free air. No acute cardiopulmonary disease. Electronically Signed   By: Rolm Baptise M.D.   On: 05/04/2015 16:39    Microbiology: No results found for this or any previous visit (from the past 240 hour(s)).   Labs: Basic Metabolic Panel:  Recent Labs Lab 05/26/15 1703  NA 138  K 3.8  CL 101  CO2 29  GLUCOSE 206*  BUN 13  CREATININE 0.83  CALCIUM 9.3   Liver Function Tests: No results for input(s): AST, ALT,  ALKPHOS, BILITOT, PROT, ALBUMIN in the last 168 hours. No results for input(s): LIPASE, AMYLASE in the last 168 hours. No results for input(s): AMMONIA in the last 168 hours. CBC:  Recent Labs Lab 05/26/15 1703  WBC 6.4  HGB 13.4  HCT 39.8  MCV 95.4  PLT 140*   Cardiac Enzymes:  Recent Labs Lab 05/26/15 1703 05/26/15 2310 05/27/15 0512 05/27/15 1011  TROPONINI <0.03 <0.03 <0.03 <0.03   BNP: BNP (last 3 results)  Recent Labs  05/02/15 1230  BNP 217.0*    ProBNP (last  3 results) No results for input(s): PROBNP in the last 8760 hours.  CBG:  Recent Labs Lab 05/27/15 0746  GLUCAP 206*    Signed:  [name], [name]  [facility] 05/27/2015, 4:24 PM

## 2015-05-27 NOTE — Progress Notes (Signed)
Patient given discharge papers with no questions. Instructed patient to call Jory Sims in the morning regarding overnight stay this weekend. Patient eating supper and then leaving for home via personal vehicle. Staff will take patient out to his car.

## 2015-06-05 NOTE — ED Provider Notes (Signed)
CSN: [id number]     Arrival date & time 05/20/15  1448 History   First MD Initiated Contact with Patient 05/20/15 1515     Chief Complaint  Patient presents with  . Chest Pain     (Consider location/radiation/quality/duration/timing/severity/associated sxs/prior Treatment) HPI..... Patient presents with fleeting sharp chest pain earlier today. No dyspnea diaphoresis or nausea. Regular visitor to the emergency department. He appears normal. No radiation of pain. Severity is mild. Symptoms have disappeared now.  Past Medical History  Diagnosis Date  . Diabetes mellitus, type II ([place])   . Hyperlipidemia   . Coronary atherosclerosis of native coronary artery     a. CABG x 4 in 1989 (VG->OM1->OM2, VG->RCA, LIMA->LAD), b. 05/2010: DES to VG-OM1/OM2, DES to distal LCx. c. NSTEMI in 04/2011 - TO distal LCX stent and VG->OM2. d. 02/2012 NSTEMI DES to VG-OM1/continuation to OM2 occluded. e. inferior STEMI s/p DES to SVG-RAMUS 06/2012. f. inferolat STEMI 09/2012 s/p DES to SVG-interm; g. Lex MV (11/14):  EF 35%, inf-lat scar with small peri-infarct ischemia  . Essential hypertension, benign   . Osteoarthritis   . History of stroke   . History of pneumonia   . Cervical vertebral fracture ([place])   . Chronic back pain   . Benign prostatic hypertrophy     History of urinary retention  . Peptic ulcer disease   . Gastroesophageal reflux disease   . Ischemic cardiomyopathy Nov 2015    EF 35% cath, 45-50% by echo  . Hypothyroidism   . Anxiety about health     "multiple somatic complaints"   Past Surgical History  Procedure Laterality Date  . Tonsillectomy    . Coronary artery bypass graft  1989  . Coronary angioplasty  10/12, 8/13, 12/13, 3/14    SVG-OM PCI  . Cardiac catheterization  05/19/14    SVG-OM occl- medical Rx  . Left heart catheterization with coronary angiogram N/A 11/01/2011    Procedure: LEFT HEART CATHETERIZATION WITH CORONARY ANGIOGRAM;  Surgeon: Lorretta Harp, MD;  Location:  [facility] CATH LAB;  Service: Cardiovascular;  Laterality: N/A;  . Percutaneous coronary stent intervention (pci-s) N/A 11/01/2011    Procedure: PERCUTANEOUS CORONARY STENT INTERVENTION (PCI-S);  Surgeon: Lorretta Harp, MD;  Location[facility] CATH LAB;  Service: Cardiovascular;  Laterality: N/A;  . Left heart catheterization with coronary/graft angiogram N/A 03/10/2012    Procedure: LEFT HEART CATHETERIZATION WITH [name];  Surgeon: Sherren Mocha, MD;  Location: [facility] CATH LAB;  Service: Cardiovascular;  Laterality: N/A;  . Left heart catheterization with coronary angiogram N/A 06/18/2012    Procedure: LEFT HEART CATHETERIZATION WITH CORONARY ANGIOGRAM;  Surgeon: Peter M Martinique, MD;  Location: [facility] CATH LAB;  Service: Cardiovascular;  Laterality: N/A;  . Percutaneous coronary stent intervention (pci-s)  06/18/2012    Procedure: PERCUTANEOUS CORONARY STENT INTERVENTION (PCI-S);  Surgeon: Peter M Martinique, MD;  Location: [facility] CATH LAB;  Service: Cardiovascular;;  . Left heart catheterization with coronary/graft angiogram  10/06/2012    Procedure: LEFT HEART CATHETERIZATION WITH [name];  Surgeon: Burnell Blanks, MD;  Location: [facility] CATH LAB;  Service: Cardiovascular;;  . Percutaneous coronary stent intervention (pci-s)  10/06/2012    Procedure: PERCUTANEOUS CORONARY STENT INTERVENTION (PCI-S);  Surgeon: Burnell Blanks, MD;  Location: [facility] CATH LAB;  Service: Cardiovascular;;  . Left heart catheterization with coronary/graft angiogram N/A 11/09/2012    Procedure: LEFT HEART CATHETERIZATION WITH [name];  Surgeon: Peter M Martinique, MD;  Location: [facility] CATH LAB;  Service: Cardiovascular;  Laterality: N/A;  .  Left heart catheterization with coronary/graft angiogram N/A 05/19/2014    Procedure: LEFT HEART CATHETERIZATION WITH [name];  Surgeon: Troy Sine, MD;  Location: [facility] CATH LAB;  Service: Cardiovascular;  Laterality: N/A;   Family History   Problem Relation Age of Onset  . Early death      Parents died young  . Appendicitis Mother     Pt was 81 year old  . Heart attack Father 42   Social History  Substance Use Topics  . Smoking status: Former Smoker -- 2.00 packs/day for 10 years    Types: Cigarettes    Start date: 07/14/1950    Quit date: 07/14/1961  . Smokeless tobacco: Current User    Types: Chew  . Alcohol Use: No    Review of Systems  All other systems reviewed and are negative.     Allergies  Review of patient's allergies indicates no known allergies.  Home Medications   Prior to Admission medications   Medication Sig Start Date End Date Taking? Authorizing Provider  acetaminophen (TYLENOL) 325 MG tablet Take 2 tablets (650 mg total) by mouth every 4 (four) hours as needed for headache or mild pain. Patient taking differently: Take 325 mg by mouth 2 (two) times daily as needed for headache or mild pain.  05/21/14  Yes Luke K Kilroy, PA-C  albuterol (PROVENTIL HFA;VENTOLIN HFA) 108 (90 BASE) MCG/ACT inhaler Inhale 2 puffs into the lungs every 6 (six) hours as needed for wheezing or shortness of breath.   Yes Historical Provider, MD  aspirin EC 81 MG tablet Take 81 mg by mouth daily.   Yes Historical Provider, MD  atorvastatin (LIPITOR) 40 MG tablet Take 1 tablet (40 mg total) by mouth daily at 6 PM. 05/29/13  Yes Scott T Kathlen Mody, PA-C  bismuth subsalicylate (PEPTO BISMOL) 262 MG/15ML suspension Take 30 mLs by mouth every 6 (six) hours as needed for indigestion or diarrhea or loose stools.    Yes Historical Provider, MD  BRILINTA 90 MG TABS tablet TAKE 1 TABLET BY MOUTH TWICE DAILY. 05/15/15  Yes Lendon Colonel, NP  carvedilol (COREG) 3.125 MG tablet TAKE 1 TABLET BY MOUTH TWICE DAILY WITH MEALS. (HEART RATE & BLOOD PRESSURE) 03/05/15  Yes Lendon Colonel, NP  famotidine (PEPCID) 20 MG tablet Take 1 tablet (20 mg total) by mouth at bedtime. 03/03/15  Yes Rexene Alberts, MD  isosorbide mononitrate (IMDUR) 30  MG 24 hr tablet Take 1.5 tablets (45 mg total) by mouth daily. 03/03/15  Yes Rexene Alberts, MD  levothyroxine (SYNTHROID, LEVOTHROID) 50 MCG tablet Take 50 mcg by mouth daily before breakfast.  09/08/14  Yes Historical Provider, MD  loperamide (IMODIUM) 2 MG capsule Take 1 capsule (2 mg total) by mouth 4 (four) times daily as needed for diarrhea or loose stools. 04/01/15  Yes Kristen N Ward, DO  metFORMIN (GLUCOPHAGE) 500 MG tablet Take 2 tablets (1,000 mg total) by mouth 2 (two) times daily with a meal. RESTART ON Sunday, 03/04/15. Patient taking differently: Take 1,000 mg by mouth 2 (two) times daily with a meal.  03/03/15  Yes Rexene Alberts, MD  Multiple Vitamin (MULTIVITAMIN WITH MINERALS) TABS Take 1 tablet by mouth daily.   Yes Historical Provider, MD  tamsulosin (FLOMAX) 0.4 MG CAPS Take 0.4 mg by mouth daily after supper.  01/27/13  Yes Historical Provider, MD  acyclovir ointment (ZOVIRAX) 5 % Apply 1 application topically every 3 (three) hours. Apply to lip tid 05/20/15   [name], MD  ALPRAZolam (XANAX) 0.5 MG tablet Take 0.5 mg by mouth at bedtime.    Historical Provider, MD  furosemide (LASIX) 20 MG tablet Take 20 mg by mouth as needed for fluid. Take 1 tablet as needed if 3 lb weight gain. 05/06/13   Lendon Colonel, NP  nitroGLYCERIN (NITROSTAT) 0.4 MG SL tablet Place 0.4 mg under the tongue every 5 (five) minutes as needed for chest pain.    Historical Provider, MD  potassium chloride (K-DUR) 10 MEQ tablet Take 10 mEq by mouth as needed. Takes along with the Furosemide when he has swelling.    Historical Provider, MD   BP 141/80 mmHg  Pulse 85  Temp(Src) 98.8 F (37.1 C) (Oral)  Resp 13  Ht 5\' 10"  (1.778 m)  Wt 170 lb (77.111 kg)  BMI 24.39 kg/m2  SpO2 97% Physical Exam  Constitutional: He is oriented to person, place, and time. He appears well-developed and well-nourished.  HENT:  Head: Normocephalic and atraumatic.  Eyes: Conjunctivae and EOM are normal. Pupils are equal,  round, and reactive to light.  Neck: Normal range of motion. Neck supple.  Cardiovascular: Normal rate and regular rhythm.   Pulmonary/Chest: Effort normal and breath sounds normal.  Abdominal: Soft. Bowel sounds are normal.  Musculoskeletal: Normal range of motion.  Neurological: He is alert and oriented to person, place, and time.  Skin: Skin is warm and dry.  Psychiatric: He has a normal mood and affect. His behavior is normal.  Nursing note and vitals reviewed.   ED Course  Procedures (including critical care time) Labs Review Labs Reviewed  CBC WITH DIFFERENTIAL/PLATELET - Abnormal; Notable for the following:    RBC 4.20 (*)    All other components within normal limits  BASIC METABOLIC PANEL - Abnormal; Notable for the following:    Glucose, Bld 198 (*)    All other components within normal limits  TROPONIN I    Imaging Review No results found. I have personally reviewed and evaluated these images and lab results as part of my medical decision-making.   EKG Interpretation   Date/Time:  Sunday May 20 2015 14:57:40 EST Ventricular Rate:  85 PR Interval:  204 QRS Duration: 108 QT Interval:  385 QTC Calculation: 458 R Axis:   95 Text Interpretation:  Sinus rhythm Inferoposterior infarct, recent Lateral  leads are also involved Confirmed by Wesleigh Markovic  MD, Michaelann Gunnoe ([id number]) on 05/20/2015  3:47:22 PM      MDM   Final diagnoses:  Atypical chest pain    Screening tests including EKG and troponin negative for acute changes. Glucose slightly elevated. Patient has primary care follow-up.    [name], MD 06/05/15 1248

## 2015-06-22 ENCOUNTER — Emergency Department (HOSPITAL_COMMUNITY): Payer: Medicare Other

## 2015-06-22 ENCOUNTER — Encounter (HOSPITAL_COMMUNITY): Payer: Self-pay | Admitting: Emergency Medicine

## 2015-06-22 ENCOUNTER — Emergency Department (HOSPITAL_COMMUNITY)
Admission: EM | Admit: 2015-06-22 | Discharge: 2015-06-22 | Disposition: A | Discharge status: Home / Self Care | Payer: Medicare Other | Attending: Emergency Medicine | Admitting: Emergency Medicine

## 2015-06-22 ENCOUNTER — Emergency Department (HOSPITAL_COMMUNITY)
Admission: EM | Admit: 2015-06-22 | Discharge: 2015-06-22 | Disposition: A | Discharge status: Home / Self Care | Payer: Medicare Other | Source: Home / Self Care | Attending: Emergency Medicine | Admitting: Emergency Medicine

## 2015-06-22 DIAGNOSIS — Z8701 Personal history of pneumonia (recurrent): Secondary | ICD-10-CM

## 2015-06-22 DIAGNOSIS — E119 Type 2 diabetes mellitus without complications: Secondary | ICD-10-CM

## 2015-06-22 DIAGNOSIS — R61 Generalized hyperhidrosis: Secondary | ICD-10-CM | POA: Diagnosis not present

## 2015-06-22 DIAGNOSIS — I2510 Atherosclerotic heart disease of native coronary artery without angina pectoris: Secondary | ICD-10-CM

## 2015-06-22 DIAGNOSIS — Z951 Presence of aortocoronary bypass graft: Secondary | ICD-10-CM | POA: Insufficient documentation

## 2015-06-22 DIAGNOSIS — Z79899 Other long term (current) drug therapy: Secondary | ICD-10-CM

## 2015-06-22 DIAGNOSIS — Z87891 Personal history of nicotine dependence: Secondary | ICD-10-CM

## 2015-06-22 DIAGNOSIS — I10 Essential (primary) hypertension: Secondary | ICD-10-CM | POA: Insufficient documentation

## 2015-06-22 DIAGNOSIS — K219 Gastro-esophageal reflux disease without esophagitis: Secondary | ICD-10-CM

## 2015-06-22 DIAGNOSIS — Z9861 Coronary angioplasty status: Secondary | ICD-10-CM | POA: Insufficient documentation

## 2015-06-22 DIAGNOSIS — M1990 Unspecified osteoarthritis, unspecified site: Secondary | ICD-10-CM | POA: Insufficient documentation

## 2015-06-22 DIAGNOSIS — Z7982 Long term (current) use of aspirin: Secondary | ICD-10-CM

## 2015-06-22 DIAGNOSIS — Z8781 Personal history of (healed) traumatic fracture: Secondary | ICD-10-CM | POA: Insufficient documentation

## 2015-06-22 DIAGNOSIS — E785 Hyperlipidemia, unspecified: Secondary | ICD-10-CM

## 2015-06-22 DIAGNOSIS — G8929 Other chronic pain: Secondary | ICD-10-CM | POA: Insufficient documentation

## 2015-06-22 DIAGNOSIS — Z8711 Personal history of peptic ulcer disease: Secondary | ICD-10-CM | POA: Insufficient documentation

## 2015-06-22 DIAGNOSIS — E039 Hypothyroidism, unspecified: Secondary | ICD-10-CM | POA: Insufficient documentation

## 2015-06-22 DIAGNOSIS — R079 Chest pain, unspecified: Secondary | ICD-10-CM | POA: Insufficient documentation

## 2015-06-22 DIAGNOSIS — Z8673 Personal history of transient ischemic attack (TIA), and cerebral infarction without residual deficits: Secondary | ICD-10-CM | POA: Insufficient documentation

## 2015-06-22 DIAGNOSIS — R1013 Epigastric pain: Secondary | ICD-10-CM

## 2015-06-22 LAB — COMPREHENSIVE METABOLIC PANEL
ALBUMIN: 3.8 g/dL (ref 3.5–5.0)
ALT: 26 U/L (ref 17–63)
ANION GAP: 9 (ref 5–15)
AST: 26 U/L (ref 15–41)
Alkaline Phosphatase: 80 U/L (ref 38–126)
BILIRUBIN TOTAL: 0.6 mg/dL (ref 0.3–1.2)
BUN: 13 mg/dL (ref 6–20)
CHLORIDE: 104 mmol/L (ref 101–111)
CO2: 25 mmol/L (ref 22–32)
Calcium: 9.5 mg/dL (ref 8.9–10.3)
Creatinine, Ser: 0.82 mg/dL (ref 0.61–1.24)
GFR calc Af Amer: >60 mL/min (ref >60)
Glucose, Bld: 204 mg/dL — ABNORMAL HIGH (ref 65–99)
POTASSIUM: 4.2 mmol/L (ref 3.5–5.1)
Sodium: 138 mmol/L (ref 135–145)
TOTAL PROTEIN: 6.5 g/dL (ref 6.5–8.1)

## 2015-06-22 LAB — URINALYSIS, ROUTINE W REFLEX MICROSCOPIC
BILIRUBIN URINE: NEGATIVE
Glucose, UA: 100 mg/dL — AB
Hgb urine dipstick: NEGATIVE
LEUKOCYTES UA: NEGATIVE
NITRITE: NEGATIVE
PROTEIN: NEGATIVE mg/dL
Specific Gravity, Urine: 1.01 (ref 1.005–1.030)
pH: 7 (ref 5.0–8.0)

## 2015-06-22 LAB — CBC WITH DIFFERENTIAL/PLATELET
BASOS ABS: 0 10*3/uL (ref 0.0–0.1)
BASOS PCT: 0 %
EOS PCT: 2 %
Eosinophils Absolute: 0.2 10*3/uL (ref 0.0–0.7)
HEMATOCRIT: 39 % (ref 39.0–52.0)
Hemoglobin: 13.3 g/dL (ref 13.0–17.0)
Lymphocytes Relative: 14 %
Lymphs Abs: 1 10*3/uL (ref 0.7–4.0)
MCH: 32.3 pg (ref 26.0–34.0)
MCHC: 34.1 g/dL (ref 30.0–36.0)
MCV: 94.7 fL (ref 78.0–100.0)
MONO ABS: 0.7 10*3/uL (ref 0.1–1.0)
MONOS PCT: 9 %
NEUTROS ABS: 5.1 10*3/uL (ref 1.7–7.7)
Neutrophils Relative %: 75 %
PLATELETS: 150 10*3/uL (ref 150–400)
RBC: 4.12 MIL/uL — ABNORMAL LOW (ref 4.22–5.81)
RDW: 12.5 % (ref 11.5–15.5)
WBC: 7 10*3/uL (ref 4.0–10.5)

## 2015-06-22 LAB — LIPASE, BLOOD: LIPASE: 28 U/L (ref 11–51)

## 2015-06-22 LAB — TROPONIN I: Troponin I: <0.03 ng/mL (ref <0.031)

## 2015-06-22 MED ORDER — GI COCKTAIL ~~LOC~~
30.0000 mL | Freq: Once | ORAL | Status: AC
  Administered 2015-06-22: 30 mL via ORAL
  Filled 2015-06-22: qty 30

## 2015-06-22 NOTE — ED Notes (Signed)
Pt had small skin tear and after I wrapped it for him, pt stated he didn't want to see the doctor.

## 2015-06-22 NOTE — Discharge Instructions (Signed)
Nonspecific Chest Pain  Increase your imdur to 45 mg daily.  Follow up with the cardiologist next week. Return to the Ed if you develop new or worsening symptoms. Chest pain can be caused by many different conditions. There is always a chance that your pain could be related to something serious, such as a heart attack or a blood clot in your lungs. Chest pain can also be caused by conditions that are not life-threatening. If you have chest pain, it is very important to follow up with your health care provider. CAUSES  Chest pain can be caused by:  Heartburn.  Pneumonia or bronchitis.  Anxiety or stress.  Inflammation around your heart (pericarditis) or lung (pleuritis or pleurisy).  A blood clot in your lung.  A collapsed lung (pneumothorax). It can develop suddenly on its own (spontaneous pneumothorax) or from trauma to the chest.  Shingles infection (varicella-zoster virus).  Heart attack.  Damage to the bones, muscles, and cartilage that make up your chest wall. This can include:  Bruised bones due to injury.  Strained muscles or cartilage due to frequent or repeated coughing or overwork.  Fracture to one or more ribs.  Sore cartilage due to inflammation (costochondritis). RISK FACTORS  Risk factors for chest pain may include:  Activities that increase your risk for trauma or injury to your chest.  Respiratory infections or conditions that cause frequent coughing.  Medical conditions or overeating that can cause heartburn.  Heart disease or family history of heart disease.  Conditions or health behaviors that increase your risk of developing a blood clot.  Having had chicken pox (varicella zoster). SIGNS AND SYMPTOMS Chest pain can feel like:  Burning or tingling on the surface of your chest or deep in your chest.  Crushing, pressure, aching, or squeezing pain.  Dull or sharp pain that is worse when you move, cough, or take a deep breath.  Pain that is also  felt in your back, neck, shoulder, or arm, or pain that spreads to any of these areas. Your chest pain may come and go, or it may stay constant. DIAGNOSIS Lab tests or other studies may be needed to find the cause of your pain. Your health care provider may have you take a test called an ambulatory ECG (electrocardiogram). An ECG records your heartbeat patterns at the time the test is performed. You may also have other tests, such as:  Transthoracic echocardiogram (TTE). During echocardiography, sound waves are used to create a picture of all of the heart structures and to look at how blood flows through your heart.  Transesophageal echocardiogram (TEE).This is a more advanced imaging test that obtains images from inside your body. It allows your health care provider to see your heart in finer detail.  Cardiac monitoring. This allows your health care provider to monitor your heart rate and rhythm in real time.  Holter monitor. This is a portable device that records your heartbeat and can help to diagnose abnormal heartbeats. It allows your health care provider to track your heart activity for several days, if needed.  Stress tests. These can be done through exercise or by taking medicine that makes your heart beat more quickly.  Blood tests.  Imaging tests. TREATMENT  Your treatment depends on what is causing your chest pain. Treatment may include:  Medicines. These may include:  Acid blockers for heartburn.  Anti-inflammatory medicine.  Pain medicine for inflammatory conditions.  Antibiotic medicine, if an infection is present.  Medicines to dissolve blood  clots.  Medicines to treat coronary artery disease.  Supportive care for conditions that do not require medicines. This may include:  Resting.  Applying heat or cold packs to injured areas.  Limiting activities until pain decreases. HOME CARE INSTRUCTIONS  If you were prescribed an antibiotic medicine, finish it all  even if you start to feel better.  Avoid any activities that bring on chest pain.  Do not use any tobacco products, including cigarettes, chewing tobacco, or electronic cigarettes. If you need help quitting, ask your health care provider.  Do not drink alcohol.  Take medicines only as directed by your health care provider.  Keep all follow-up visits as directed by your health care provider. This is important. This includes any further testing if your chest pain does not go away.  If heartburn is the cause for your chest pain, you may be told to keep your head raised (elevated) while sleeping. This reduces the chance that acid will go from your stomach into your esophagus.  Make lifestyle changes as directed by your health care provider. These may include:  Getting regular exercise. Ask your health care provider to suggest some activities that are safe for you.  Eating a heart-healthy diet. A registered dietitian can help you to learn healthy eating options.  Maintaining a healthy weight.  Managing diabetes, if necessary.  Reducing stress. SEEK MEDICAL CARE IF:  Your chest pain does not go away after treatment.  You have a rash with blisters on your chest.  You have a fever. SEEK IMMEDIATE MEDICAL CARE IF:   Your chest pain is worse.  You have an increasing cough, or you cough up blood.  You have severe abdominal pain.  You have severe weakness.  You faint.  You have chills.  You have sudden, unexplained chest discomfort.  You have sudden, unexplained discomfort in your arms, back, neck, or jaw.  You have shortness of breath at any time.  You suddenly start to sweat, or your skin gets clammy.  You feel nauseous or you vomit.  You suddenly feel light-headed or dizzy.  Your heart begins to beat quickly, or it feels like it is skipping beats. These symptoms may represent a serious problem that is an emergency. Do not wait to see if the symptoms will go away. Get  medical help right away. Call your local emergency services (911 in the [place] Do not drive yourself to the hospital.   This information is not intended to replace advice given to you by your health care provider. Make sure you discuss any questions you have with your health care provider.   Document Released: 04/09/2005 Document Revised: 07/21/2014 Document Reviewed: 02/03/2014 Elsevier Interactive Patient Education [employer].

## 2015-06-22 NOTE — ED Provider Notes (Signed)
CSN: [id number]     Arrival date & time 06/22/15  0809 History  By signing my name below, I, Terressa Koyanagi, attest that this documentation has been prepared under the direction and in the presence of No att. providers found. Electronically Signed: Terressa Koyanagi, ED Scribe. 06/22/2015. 8:43 AM.  Chief Complaint  Patient presents with  . Chest Pain   HPI PCP: Carlynn Spry HPI Comments: Jeffrey [name] is a 81 y.o. male, with PMHx noted below GERD, cardiac catheterization, high BP, hyperlipidemia, CABG x4, stroke, DMTII, 10 coronary stents and ischemic cardiomyopathy, who presents to the Emergency Department complaining of recurrent, generalized, intermittent, burning, 1/10, epigastric pain onset 4 days ago. Associated Sx include diaphoresis (one episode last night). Pt reports each episode of pain lasts for hours. Pt notes frequent indigestion and states his current Sx are consistent with prior episodes of indigestion. Pt reports taking pepto bismol at home for his Sx with relief. Pt, however, points out that prior MI's presented as indigestion. Pt denies SOB, n/v, back pain, new weakness (notes chronic weakness of LLE). Pt denies nitroglycerin use at home.   Past Medical History  Diagnosis Date  . Diabetes mellitus, type II ([place])   . Hyperlipidemia   . Coronary atherosclerosis of native coronary artery     a. CABG x 4 in 1989 (VG->OM1->OM2, VG->RCA, LIMA->LAD), b. 05/2010: DES to VG-OM1/OM2, DES to distal LCx. c. NSTEMI in 04/2011 - TO distal LCX stent and VG->OM2. d. 02/2012 NSTEMI DES to VG-OM1/continuation to OM2 occluded. e. inferior STEMI s/p DES to SVG-RAMUS 06/2012. f. inferolat STEMI 09/2012 s/p DES to SVG-interm; g. Lex MV (11/14):  EF 35%, inf-lat scar with small peri-infarct ischemia  . Essential hypertension, benign   . Osteoarthritis   . History of stroke   . History of pneumonia   . Cervical vertebral fracture ([place])   . Chronic back pain   . Benign prostatic hypertrophy     History  of urinary retention  . Peptic ulcer disease   . Gastroesophageal reflux disease   . Ischemic cardiomyopathy Nov 2015    EF 35% cath, 45-50% by echo  . Hypothyroidism   . Anxiety about health     "multiple somatic complaints"   Past Surgical History  Procedure Laterality Date  . Tonsillectomy    . Coronary artery bypass graft  1989  . Coronary angioplasty  10/12, 8/13, 12/13, 3/14    SVG-OM PCI  . Cardiac catheterization  05/19/14    SVG-OM occl- medical Rx  . Left heart catheterization with coronary angiogram N/A 11/01/2011    Procedure: LEFT HEART CATHETERIZATION WITH CORONARY ANGIOGRAM;  Surgeon: Lorretta Harp, MD;  Location: [facility] CATH LAB;  Service: Cardiovascular;  Laterality: N/A;  . Percutaneous coronary stent intervention (pci-s) N/A 11/01/2011    Procedure: PERCUTANEOUS CORONARY STENT INTERVENTION (PCI-S);  Surgeon: Lorretta Harp, MD;  Location: [facility] CATH LAB;  Service: Cardiovascular;  Laterality: N/A;  . Left heart catheterization with coronary/graft angiogram N/A 03/10/2012    Procedure: LEFT HEART CATHETERIZATION WITH [name];  Surgeon: Sherren Mocha, MD;  Location: [facility] CATH LAB;  Service: Cardiovascular;  Laterality: N/A;  . Left heart catheterization with coronary angiogram N/A 06/18/2012    Procedure: LEFT HEART CATHETERIZATION WITH CORONARY ANGIOGRAM;  Surgeon: Peter M Martinique, MD;  Location: [facility] CATH LAB;  Service: Cardiovascular;  Laterality: N/A;  . Percutaneous coronary stent intervention (pci-s)  06/18/2012    Procedure: PERCUTANEOUS CORONARY STENT INTERVENTION (PCI-S);  Surgeon: Peter M Martinique, MD;  Location: [facility] CATH LAB;  Service: Cardiovascular;;  . Left heart catheterization with coronary/graft angiogram  10/06/2012    Procedure: LEFT HEART CATHETERIZATION WITH [name];  Surgeon: Burnell Blanks, MD;  Location: [facility] CATH LAB;  Service: Cardiovascular;;  . Percutaneous coronary stent intervention (pci-s)  10/06/2012    Procedure:  PERCUTANEOUS CORONARY STENT INTERVENTION (PCI-S);  Surgeon: Burnell Blanks, MD;  Location: [facility] CATH LAB;  Service: Cardiovascular;;  . Left heart catheterization with coronary/graft angiogram N/A 11/09/2012    Procedure: LEFT HEART CATHETERIZATION WITH [name];  Surgeon: Peter M Martinique, MD;  Location: [facility] CATH LAB;  Service: Cardiovascular;  Laterality: N/A;  . Left heart catheterization with coronary/graft angiogram N/A 05/19/2014    Procedure: LEFT HEART CATHETERIZATION WITH [name];  Surgeon: Troy Sine, MD;  Location: [facility] CATH LAB;  Service: Cardiovascular;  Laterality: N/A;   Family History  Problem Relation Age of Onset  . Early death      Parents died young  . Appendicitis Mother     Pt was 21 year old  . Heart attack Father 31   Social History  Substance Use Topics  . Smoking status: Former Smoker -- 2.00 packs/day for 10 years    Types: Cigarettes    Start date: 07/14/1950    Quit date: 07/14/1961  . Smokeless tobacco: Current User    Types: Chew  . Alcohol Use: No    Review of Systems  A complete 10 system review of systems was obtained and all systems are negative except as noted in the HPI and PMH.   Allergies  Review of patient's allergies indicates no known allergies.  Home Medications   Prior to Admission medications   Medication Sig Start Date End Date Taking? Authorizing Provider  acetaminophen (TYLENOL) 325 MG tablet Take 2 tablets (650 mg total) by mouth every 4 (four) hours as needed for headache or mild pain. Patient taking differently: Take 325 mg by mouth 2 (two) times daily as needed for headache or mild pain.  05/21/14  Yes Luke K Kilroy, PA-C  acyclovir ointment (ZOVIRAX) 5 % Apply 1 application topically every 3 (three) hours. Apply to lip tid 05/20/15  Yes [name], MD  albuterol (PROVENTIL HFA;VENTOLIN HFA) 108 (90 BASE) MCG/ACT inhaler Inhale 2 puffs into the lungs every 6 (six) hours as needed for wheezing  or shortness of breath.   Yes Historical Provider, MD  ALPRAZolam Duanne Moron) 0.5 MG tablet Take 0.5 mg by mouth at bedtime as needed for anxiety.    Yes Historical Provider, MD  aspirin 325 MG tablet Take 325 mg by mouth daily as needed for moderate pain.   Yes Historical Provider, MD  aspirin EC 81 MG tablet Take 81 mg by mouth daily.   Yes Historical Provider, MD  atorvastatin (LIPITOR) 40 MG tablet Take 1 tablet (40 mg total) by mouth daily at 6 PM. 05/29/13  Yes Scott T Kathlen Mody, PA-C  bismuth subsalicylate (PEPTO BISMOL) 262 MG/15ML suspension Take 30 mLs by mouth every 6 (six) hours as needed for indigestion or diarrhea or loose stools.    Yes Historical Provider, MD  BRILINTA 90 MG TABS tablet TAKE 1 TABLET BY MOUTH TWICE DAILY. 05/15/15  Yes Lendon Colonel, NP  carvedilol (COREG) 3.125 MG tablet TAKE 1 TABLET BY MOUTH TWICE DAILY WITH MEALS. (HEART RATE & BLOOD PRESSURE) 03/05/15  Yes Lendon Colonel, NP  famotidine (PEPCID) 20 MG tablet Take 1 tablet (20 mg total) by mouth at bedtime. 03/03/15  Yes Rexene Alberts, MD  furosemide (LASIX) 20 MG tablet Take 20 mg by mouth as needed for fluid. Take 1 tablet as needed if 3 lb weight gain. 05/06/13  Yes Lendon Colonel, NP  isosorbide mononitrate (IMDUR) 30 MG 24 hr tablet Take 1.5 tablets (45 mg total) by mouth daily. 03/03/15  Yes Rexene Alberts, MD  levothyroxine (SYNTHROID, LEVOTHROID) 50 MCG tablet Take 50 mcg by mouth daily before breakfast.  09/08/14  Yes Historical Provider, MD  loperamide (IMODIUM) 2 MG capsule Take 1 capsule (2 mg total) by mouth 4 (four) times daily as needed for diarrhea or loose stools. 04/01/15  Yes Kristen N Ward, DO  metFORMIN (GLUCOPHAGE) 500 MG tablet Take 2 tablets (1,000 mg total) by mouth 2 (two) times daily with a meal. RESTART ON Sunday, 03/04/15. Patient taking differently: Take 1,000 mg by mouth 2 (two) times daily with a meal.  03/03/15  Yes Rexene Alberts, MD  Multiple Vitamin (MULTIVITAMIN WITH MINERALS) TABS  Take 1 tablet by mouth daily.   Yes Historical Provider, MD  nitroGLYCERIN (NITROSTAT) 0.4 MG SL tablet Place 0.4 mg under the tongue every 5 (five) minutes as needed for chest pain.   Yes Historical Provider, MD  potassium chloride (K-DUR) 10 MEQ tablet Take 10 mEq by mouth as needed. Takes along with the Furosemide when he has swelling.   Yes Historical Provider, MD  tamsulosin (FLOMAX) 0.4 MG CAPS Take 0.4 mg by mouth daily after supper.  01/27/13  Yes Historical Provider, MD   Triage Vitals: BP 164/85 mmHg  Pulse 88  Temp(Src) 98 F (36.7 C)  Resp 18  Ht 6\' 1"  (1.854 m)  Wt 170 lb (77.111 kg)  BMI 22.43 kg/m2  SpO2 97% Physical Exam  Constitutional: He is oriented to person, place, and time. He appears well-developed and well-nourished. No distress.  Difficult to keep on track, rambling Hx, poor historian  HENT:  Head: Normocephalic and atraumatic.  Mouth/Throat: Oropharynx is clear and moist. No oropharyngeal exudate.  Eyes: Conjunctivae and EOM are normal. Pupils are equal, round, and reactive to light.  Neck: Normal range of motion. Neck supple.  No meningismus.  Cardiovascular: Normal rate, regular rhythm, normal heart sounds and intact distal pulses.   No murmur heard. Pulmonary/Chest: Effort normal and breath sounds normal. No respiratory distress.  Abdominal: Soft. There is tenderness in the epigastric area. There is no rebound and no guarding.  No RUQ tenderness  Musculoskeletal: Normal range of motion. He exhibits no edema or tenderness.  Equal femoral PT pulses and slight weakness of LLE at baseline per pt.   Neurological: He is alert and oriented to person, place, and time. No cranial nerve deficit. He exhibits normal muscle tone. Coordination normal.  No ataxia on finger to nose bilaterally. No pronator drift. 5/5 strength throughout. CN 2-12 intact.Equal grip strength. Sensation intact.   Skin: Skin is warm.  Psychiatric: He has a normal mood and affect. His behavior  is normal.  Nursing note and vitals reviewed.   ED Course  Procedures (including critical care time) DIAGNOSTIC STUDIES: Oxygen Saturation is 97% on RA, nl by my interpretation.    COORDINATION OF CARE: 8:30AM: Discussed treatment plan which includes EKG results, labs, imaging and meds  with pt at bedside; patient verbalizes understanding and agrees with treatment plan.  Labs Review Labs Reviewed  CBC WITH DIFFERENTIAL/PLATELET - Abnormal; Notable for the following:    RBC 4.12 (*)    All other components within normal limits  COMPREHENSIVE  METABOLIC PANEL - Abnormal; Notable for the following:    Glucose, Bld 204 (*)    All other components within normal limits  URINALYSIS, ROUTINE W REFLEX MICROSCOPIC (NOT AT [facility]) - Abnormal; Notable for the following:    Glucose, UA 100 (*)    Ketones, ur TRACE (*)    All other components within normal limits  LIPASE, BLOOD  TROPONIN I  TROPONIN I    Imaging Review Dg Abd Acute W/chest  06/22/2015  CLINICAL DATA:  Epigastric pain radiating to the chest, 4 days duration. Previous CABG. EXAM: DG ABDOMEN ACUTE W/ 1V CHEST COMPARISON:  05/26/2015 and multiple previous exams. FINDINGS: Bowel gas pattern is normal without evidence of ileus, obstruction or free air. There is a moderate amount of fecal matter in the colon. No abnormal soft tissue calcifications other than aortic calcification. There is curvature and chronic degenerative disease of the spine. One-view chest shows previous median sternotomy and CABG. Heart size is normal. The aorta shows atherosclerosis. Lungs are clear. The vascularity is normal. No effusions. No free air under the diaphragm. IMPRESSION: Negative acute abdominal series. Fairly large amount of fecal matter in the colon. Previous CABG. Electronically Signed   By: Nelson Chimes M.D.   On: 06/22/2015 09:29   I have personally reviewed and evaluated these images and lab results as part of my medical decision-making.   EKG  Interpretation   Date/Time:  Friday June 22 2015 08:19:02 EST Ventricular Rate:  82 PR Interval:  193 QRS Duration: 108 QT Interval:  362 QTC Calculation: 423 R Axis:   91 Text Interpretation:  Sinus rhythm Probable right ventricular hypertrophy  Probable inferior infarct, age indeterminate Lateral leads are also  involved No significant change was found Confirmed by Wyvonnia Dusky  MD, Delainie Chavana  [phone]) on 06/22/2015 8:22:28 AM      MDM   Final diagnoses:  Chest pain, unspecified chest pain type   4 days of epigastric pain radiates into his chest that comes and goes lasting several hours at a time. He states this is similar to his "indigestion" but also feels similar to when he had his heart problems. History of remote cardiac bypass with 10 stents. Recent admission last month for similar pain. Most recent catheterization in November 2015 showed  "Complex cardiac history with bypass and multiple stents. Last catheterization in November reviewed. Cardiac catheterization on November 6 revealed severe multivessel CAD, patent LIMA to LAD with associated collateralization to the distal RCA and obtuse marginal system, occluded SVG to the OM1 and OM 2, and occluded SVG to the distal RCA. Medical therapy was recommended that time"  Patient is vague in his history and rambling. His EKG is unchanged with lateral ST depressions and T-wave inversions. ACS is a possibility.  Doubt PE, doubt aortic disection. Troponin negative. LFTs and lipase normal. Pain has improved with GI cocktail.  D/w Dr. Bronson Ing.  He knows patient well.  He agrees with the delta troponin and likely discharge. He recommends increasing Imdur to 45 mg.  Patient feeling improved.  His pain has resolved.  Troponin is negative x2.  He is tolerating PO.  Instructed to see his cardiologist for a follow up appointment.  Return precautions discussed  I personally performed the services described in this documentation, which was  scribed in my presence. The recorded information has been reviewed and is accurate.   Ezequiel Essex, MD 06/22/15 [phone]

## 2015-06-22 NOTE — ED Provider Notes (Signed)
Pt LWBS by physician.  [place], DO 06/22/15 0207

## 2015-06-22 NOTE — ED Notes (Signed)
Pt reports epigastric pain radiating to chest x 4 days. Pt hx of GERD. Pt states pepto bismol relieves symptoms. Pt denies CP or SOB at this time. Denies n/v.

## 2015-06-27 ENCOUNTER — Ambulatory Visit: Payer: Self-pay | Admitting: "Endocrinology

## 2015-06-29 ENCOUNTER — Encounter (HOSPITAL_COMMUNITY): Payer: Self-pay | Admitting: Emergency Medicine

## 2015-06-29 ENCOUNTER — Emergency Department (HOSPITAL_COMMUNITY): Payer: Medicare Other

## 2015-06-29 ENCOUNTER — Emergency Department (HOSPITAL_COMMUNITY)
Admission: EM | Admit: 2015-06-29 | Discharge: 2015-06-29 | Disposition: A | Discharge status: Home / Self Care | Payer: Medicare Other | Attending: Emergency Medicine | Admitting: Emergency Medicine

## 2015-06-29 DIAGNOSIS — F418 Other specified anxiety disorders: Secondary | ICD-10-CM | POA: Insufficient documentation

## 2015-06-29 DIAGNOSIS — E039 Hypothyroidism, unspecified: Secondary | ICD-10-CM | POA: Diagnosis not present

## 2015-06-29 DIAGNOSIS — Z8701 Personal history of pneumonia (recurrent): Secondary | ICD-10-CM | POA: Diagnosis not present

## 2015-06-29 DIAGNOSIS — Z8673 Personal history of transient ischemic attack (TIA), and cerebral infarction without residual deficits: Secondary | ICD-10-CM | POA: Insufficient documentation

## 2015-06-29 DIAGNOSIS — Z87438 Personal history of other diseases of male genital organs: Secondary | ICD-10-CM | POA: Diagnosis not present

## 2015-06-29 DIAGNOSIS — Z7984 Long term (current) use of oral hypoglycemic drugs: Secondary | ICD-10-CM | POA: Diagnosis not present

## 2015-06-29 DIAGNOSIS — R079 Chest pain, unspecified: Secondary | ICD-10-CM | POA: Diagnosis not present

## 2015-06-29 DIAGNOSIS — I255 Ischemic cardiomyopathy: Secondary | ICD-10-CM | POA: Insufficient documentation

## 2015-06-29 DIAGNOSIS — F17210 Nicotine dependence, cigarettes, uncomplicated: Secondary | ICD-10-CM | POA: Insufficient documentation

## 2015-06-29 DIAGNOSIS — G8929 Other chronic pain: Secondary | ICD-10-CM | POA: Insufficient documentation

## 2015-06-29 DIAGNOSIS — Z79899 Other long term (current) drug therapy: Secondary | ICD-10-CM | POA: Insufficient documentation

## 2015-06-29 DIAGNOSIS — I2510 Atherosclerotic heart disease of native coronary artery without angina pectoris: Secondary | ICD-10-CM | POA: Insufficient documentation

## 2015-06-29 DIAGNOSIS — E119 Type 2 diabetes mellitus without complications: Secondary | ICD-10-CM | POA: Diagnosis not present

## 2015-06-29 DIAGNOSIS — K219 Gastro-esophageal reflux disease without esophagitis: Secondary | ICD-10-CM | POA: Insufficient documentation

## 2015-06-29 DIAGNOSIS — Z7982 Long term (current) use of aspirin: Secondary | ICD-10-CM | POA: Insufficient documentation

## 2015-06-29 DIAGNOSIS — Z87312 Personal history of (healed) stress fracture: Secondary | ICD-10-CM | POA: Insufficient documentation

## 2015-06-29 DIAGNOSIS — Z9889 Other specified postprocedural states: Secondary | ICD-10-CM | POA: Diagnosis not present

## 2015-06-29 DIAGNOSIS — M1990 Unspecified osteoarthritis, unspecified site: Secondary | ICD-10-CM | POA: Insufficient documentation

## 2015-06-29 DIAGNOSIS — Z8711 Personal history of peptic ulcer disease: Secondary | ICD-10-CM | POA: Diagnosis not present

## 2015-06-29 DIAGNOSIS — I10 Essential (primary) hypertension: Secondary | ICD-10-CM | POA: Insufficient documentation

## 2015-06-29 DIAGNOSIS — E785 Hyperlipidemia, unspecified: Secondary | ICD-10-CM | POA: Insufficient documentation

## 2015-06-29 DIAGNOSIS — Z7902 Long term (current) use of antithrombotics/antiplatelets: Secondary | ICD-10-CM | POA: Diagnosis not present

## 2015-06-29 LAB — CBC
HCT: 41 % (ref 39.0–52.0)
HEMOGLOBIN: 13.8 g/dL (ref 13.0–17.0)
MCH: 31.7 pg (ref 26.0–34.0)
MCHC: 33.7 g/dL (ref 30.0–36.0)
MCV: 94.3 fL (ref 78.0–100.0)
Platelets: 155 10*3/uL (ref 150–400)
RBC: 4.35 MIL/uL (ref 4.22–5.81)
RDW: 12.4 % (ref 11.5–15.5)
WBC: 7.9 10*3/uL (ref 4.0–10.5)

## 2015-06-29 LAB — BASIC METABOLIC PANEL
ANION GAP: 12 (ref 5–15)
BUN: 14 mg/dL (ref 6–20)
CALCIUM: 9.3 mg/dL (ref 8.9–10.3)
CHLORIDE: 104 mmol/L (ref 101–111)
CO2: 21 mmol/L — AB (ref 22–32)
CREATININE: 0.8 mg/dL (ref 0.61–1.24)
GFR calc non Af Amer: >60 mL/min (ref >60)
GLUCOSE: 195 mg/dL — AB (ref 65–99)
Potassium: 4.5 mmol/L (ref 3.5–5.1)
Sodium: 137 mmol/L (ref 135–145)

## 2015-06-29 LAB — TROPONIN I: Troponin I: <0.03 ng/mL (ref <0.031)

## 2015-06-29 MED ORDER — ACYCLOVIR 5 % EX OINT: 1.0000 "application " | TOPICAL_OINTMENT | CUTANEOUS | Status: DC

## 2015-06-29 NOTE — Discharge Instructions (Signed)

## 2015-06-29 NOTE — ED Provider Notes (Signed)
CSN: [id number]     Arrival date & time 06/29/15  [id number] History   First MD Initiated Contact with Patient 06/29/15 [phone]     Chief Complaint  Patient presents with  . Chest Pain      HPI Patient complaining of chest pain starting at 0530 this morning. States he took pepto bismol with some relief this morning.  Patient has long history of coronary disease.  Has multiple stents and has had open heart surgery.  Patient does feel better after the Pepto-Bismol. Past Medical History  Diagnosis Date  . Diabetes mellitus, type II ([place])   . Hyperlipidemia   . Coronary atherosclerosis of native coronary artery     a. CABG x 4 in 1989 (VG->OM1->OM2, VG->RCA, LIMA->LAD), b. 05/2010: DES to VG-OM1/OM2, DES to distal LCx. c. NSTEMI in 04/2011 - TO distal LCX stent and VG->OM2. d. 02/2012 NSTEMI DES to VG-OM1/continuation to OM2 occluded. e. inferior STEMI s/p DES to SVG-RAMUS 06/2012. f. inferolat STEMI 09/2012 s/p DES to SVG-interm; g. Lex MV (11/14):  EF 35%, inf-lat scar with small peri-infarct ischemia  . Essential hypertension, benign   . Osteoarthritis   . History of stroke   . History of pneumonia   . Cervical vertebral fracture ([place])   . Chronic back pain   . Benign prostatic hypertrophy     History of urinary retention  . Peptic ulcer disease   . Gastroesophageal reflux disease   . Ischemic cardiomyopathy Nov 2015    EF 35% cath, 45-50% by echo  . Hypothyroidism   . Anxiety about health     "multiple somatic complaints"   Past Surgical History  Procedure Laterality Date  . Tonsillectomy    . Coronary artery bypass graft  1989  . Coronary angioplasty  10/12, 8/13, 12/13, 3/14    SVG-OM PCI  . Cardiac catheterization  05/19/14    SVG-OM occl- medical Rx  . Left heart catheterization with coronary angiogram N/A 11/01/2011    Procedure: LEFT HEART CATHETERIZATION WITH CORONARY ANGIOGRAM;  Surgeon: Lorretta Harp, MD;  Location: [facility] CATH LAB;  Service: Cardiovascular;  Laterality: N/A;   . Percutaneous coronary stent intervention (pci-s) N/A 11/01/2011    Procedure: PERCUTANEOUS CORONARY STENT INTERVENTION (PCI-S);  Surgeon: Lorretta Harp, MD;  Location: [facility] CATH LAB;  Service: Cardiovascular;  Laterality: N/A;  . Left heart catheterization with coronary/graft angiogram N/A 03/10/2012    Procedure: LEFT HEART CATHETERIZATION WITH [name];  Surgeon: Sherren Mocha, MD;  Location: [facility] CATH LAB;  Service: Cardiovascular;  Laterality: N/A;  . Left heart catheterization with coronary angiogram N/A 06/18/2012    Procedure: LEFT HEART CATHETERIZATION WITH CORONARY ANGIOGRAM;  Surgeon: Peter M Martinique, MD;  Location: [facility] CATH LAB;  Service: Cardiovascular;  Laterality: N/A;  . Percutaneous coronary stent intervention (pci-s)  06/18/2012    Procedure: PERCUTANEOUS CORONARY STENT INTERVENTION (PCI-S);  Surgeon: Peter M Martinique, MD;  Location: [facility] CATH LAB;  Service: Cardiovascular;;  . Left heart catheterization with coronary/graft angiogram  10/06/2012    Procedure: LEFT HEART CATHETERIZATION WITH [name];  Surgeon: Burnell Blanks, MD;  Location: [facility] CATH LAB;  Service: Cardiovascular;;  . Percutaneous coronary stent intervention (pci-s)  10/06/2012    Procedure: PERCUTANEOUS CORONARY STENT INTERVENTION (PCI-S);  Surgeon: Burnell Blanks, MD;  Location: [facility] CATH LAB;  Service: Cardiovascular;;  . Left heart catheterization with coronary/graft angiogram N/A 11/09/2012    Procedure: LEFT HEART CATHETERIZATION WITH [name];  Surgeon: Peter M Martinique, MD;  Location: [facility]  CATH LAB;  Service: Cardiovascular;  Laterality: N/A;  . Left heart catheterization with coronary/graft angiogram N/A 05/19/2014    Procedure: LEFT HEART CATHETERIZATION WITH [name];  Surgeon: Troy Sine, MD;  Location: [facility] CATH LAB;  Service: Cardiovascular;  Laterality: N/A;   Family History  Problem Relation Age of Onset  . Early death      Parents  died young  . Appendicitis Mother     Pt was 78 year old  . Heart attack Father 37   Social History  Substance Use Topics  . Smoking status: Former Smoker -- 2.00 packs/day for 10 years    Types: Cigarettes    Start date: 07/14/1950    Quit date: 07/14/1961  . Smokeless tobacco: Current User    Types: Chew  . Alcohol Use: No    Review of Systems  All other systems reviewed and are negative  Allergies  Review of patient's allergies indicates no known allergies.  Home Medications   Prior to Admission medications   Medication Sig Start Date End Date Taking? Authorizing Provider  acetaminophen (TYLENOL) 325 MG tablet Take 2 tablets (650 mg total) by mouth every 4 (four) hours as needed for headache or mild pain. Patient taking differently: Take 325 mg by mouth 2 (two) times daily as needed for headache or mild pain.  05/21/14  Yes Luke K Kilroy, PA-C  albuterol (PROVENTIL HFA;VENTOLIN HFA) 108 (90 BASE) MCG/ACT inhaler Inhale 2 puffs into the lungs every 6 (six) hours as needed for wheezing or shortness of breath.   Yes Historical Provider, MD  aspirin 325 MG tablet Take 325 mg by mouth daily as needed for moderate pain.   Yes Historical Provider, MD  atorvastatin (LIPITOR) 40 MG tablet Take 1 tablet (40 mg total) by mouth daily at 6 PM. 05/29/13  Yes Scott T Kathlen Mody, PA-C  bismuth subsalicylate (PEPTO BISMOL) 262 MG/15ML suspension Take 30 mLs by mouth every 6 (six) hours as needed for indigestion or diarrhea or loose stools.    Yes Historical Provider, MD  BRILINTA 90 MG TABS tablet TAKE 1 TABLET BY MOUTH TWICE DAILY. 05/15/15  Yes Lendon Colonel, NP  carvedilol (COREG) 3.125 MG tablet TAKE 1 TABLET BY MOUTH TWICE DAILY WITH MEALS. (HEART RATE & BLOOD PRESSURE) 03/05/15  Yes Lendon Colonel, NP  famotidine (PEPCID) 20 MG tablet Take 1 tablet (20 mg total) by mouth at bedtime. 03/03/15  Yes Rexene Alberts, MD  furosemide (LASIX) 20 MG tablet Take 20 mg by mouth as needed for fluid.  Reported on 06/29/2015 05/06/13  Yes Lendon Colonel, NP  isosorbide mononitrate (IMDUR) 30 MG 24 hr tablet Take 1.5 tablets (45 mg total) by mouth daily. 03/03/15  Yes Rexene Alberts, MD  levothyroxine (SYNTHROID, LEVOTHROID) 50 MCG tablet Take 50 mcg by mouth daily before breakfast.  09/08/14  Yes Historical Provider, MD  loperamide (IMODIUM) 2 MG capsule Take 1 capsule (2 mg total) by mouth 4 (four) times daily as needed for diarrhea or loose stools. 04/01/15  Yes Kristen N Ward, DO  metFORMIN (GLUCOPHAGE) 500 MG tablet Take 2 tablets (1,000 mg total) by mouth 2 (two) times daily with a meal. RESTART ON Sunday, 03/04/15. Patient taking differently: Take 1,000 mg by mouth 2 (two) times daily with a meal.  03/03/15  Yes Rexene Alberts, MD  Multiple Vitamin (MULTIVITAMIN WITH MINERALS) TABS Take 1 tablet by mouth daily.   Yes Historical Provider, MD  potassium chloride (K-DUR) 10 MEQ tablet Take 10 mEq  by mouth as needed. Takes along with the Furosemide when he has swelling.   Yes Historical Provider, MD  tamsulosin (FLOMAX) 0.4 MG CAPS Take 0.4 mg by mouth daily after supper.  01/27/13  Yes Historical Provider, MD  acyclovir ointment (ZOVIRAX) 5 % Apply 1 application topically every 3 (three) hours. 06/29/15   Leonard Schwartz, MD  ALPRAZolam Duanne Moron) 0.5 MG tablet Take 0.5 mg by mouth at bedtime as needed for anxiety.     Historical Provider, MD  aspirin EC 81 MG tablet Take 81 mg by mouth daily. Reported on 06/29/2015    Historical Provider, MD  nitroGLYCERIN (NITROSTAT) 0.4 MG SL tablet Place 0.4 mg under the tongue every 5 (five) minutes as needed for chest pain.    Historical Provider, MD   BP 154/77 mmHg  Pulse 68  Temp(Src) 97.5 F (36.4 C) (Axillary)  Resp 20  Ht 5\' 10"  (1.778 m)  Wt 170 lb (77.111 kg)  BMI 24.39 kg/m2  SpO2 100% Physical Exam Physical Exam  Nursing note and vitals reviewed. Constitutional: He is oriented to person, place, and time. He appears well-developed and  well-nourished. No distress.  HENT:  Head: Normocephalic and atraumatic.  Eyes: Pupils are equal, round, and reactive to light.  Neck: Normal range of motion.  Cardiovascular: Normal rate and intact distal pulses.   Pulmonary/Chest: No respiratory distress.  Abdominal: Normal appearance. He exhibits no distension.  Musculoskeletal: Normal range of motion.  Neurological: He is alert and oriented to person, place, and time. No cranial nerve deficit.  Skin: Skin is warm and dry. No rash noted.  Psychiatric: He has a normal mood and affect. His behavior is normal.   ED Course  Procedures (including critical care time) Labs Review Labs Reviewed  BASIC METABOLIC PANEL - Abnormal; Notable for the following:    CO2 21 (*)    Glucose, Bld 195 (*)    All other components within normal limits  CBC  TROPONIN I  TROPONIN I    Imaging Review Dg Chest 2 View  06/29/2015  CLINICAL DATA:  81 year old male with chest pain since 0530 hours. Initial encounter. EXAM: CHEST  2 VIEW COMPARISON:  06/22/2015 and earlier. FINDINGS: Sequelae of CABG. Stable lung volumes. Stable mild cardiomegaly. Other mediastinal contours are within normal limits. Calcified aortic atherosclerosis. No pneumothorax, pulmonary edema, pleural effusion or confluent pulmonary opacity. Stable visualized osseous structures. IMPRESSION: No acute cardiopulmonary abnormality. Electronically Signed   By: Genevie Ann M.D.   On: 06/29/2015 08:46   I have personally reviewed and evaluated these images and lab results as part of my medical decision-making.   EKG Interpretation   Date/Time:  Friday June 29 2015 07:49:50 EST Ventricular Rate:  88 PR Interval:  194 QRS Duration: 113 QT Interval:  391 QTC Calculation: 473 R Axis:   92 Text Interpretation:  Sinus rhythm Ventricular premature complex RSR' in  V1 or V2, probably normal variant Inferior infarct, age indeterminate  Lateral leads are also involved Confirmed by Miracle Criado  MD,  Cayla Wiegand [phone]) on  06/29/2015 8:03:39 AM      Patient remains asymptomatic.  After treatment in the ED the patient feels back to baseline and wants to go home. MDM   Final diagnoses:  Chest pain, unspecified chest pain type        Leonard Schwartz, MD 06/30/15 [phone]

## 2015-06-29 NOTE — ED Notes (Signed)
Pt made aware to return if symptoms worsen or if any life threatening symptoms occur.   

## 2015-06-29 NOTE — ED Notes (Addendum)
Patient complaining of chest pain starting at 0530 this morning. States he took pepto bismol with some relief this morning.

## 2015-07-01 ENCOUNTER — Emergency Department (HOSPITAL_COMMUNITY)
Admission: EM | Admit: 2015-07-01 | Discharge: 2015-07-01 | Disposition: A | Discharge status: Home / Self Care | Payer: Medicare Other | Attending: Emergency Medicine | Admitting: Emergency Medicine

## 2015-07-01 ENCOUNTER — Encounter (HOSPITAL_COMMUNITY): Payer: Self-pay | Admitting: Emergency Medicine

## 2015-07-01 DIAGNOSIS — Z7982 Long term (current) use of aspirin: Secondary | ICD-10-CM | POA: Diagnosis not present

## 2015-07-01 DIAGNOSIS — M1990 Unspecified osteoarthritis, unspecified site: Secondary | ICD-10-CM | POA: Insufficient documentation

## 2015-07-01 DIAGNOSIS — E039 Hypothyroidism, unspecified: Secondary | ICD-10-CM | POA: Insufficient documentation

## 2015-07-01 DIAGNOSIS — Z8781 Personal history of (healed) traumatic fracture: Secondary | ICD-10-CM | POA: Insufficient documentation

## 2015-07-01 DIAGNOSIS — I10 Essential (primary) hypertension: Secondary | ICD-10-CM | POA: Diagnosis not present

## 2015-07-01 DIAGNOSIS — G8929 Other chronic pain: Secondary | ICD-10-CM | POA: Diagnosis not present

## 2015-07-01 DIAGNOSIS — I2510 Atherosclerotic heart disease of native coronary artery without angina pectoris: Secondary | ICD-10-CM | POA: Diagnosis not present

## 2015-07-01 DIAGNOSIS — I252 Old myocardial infarction: Secondary | ICD-10-CM | POA: Insufficient documentation

## 2015-07-01 DIAGNOSIS — E119 Type 2 diabetes mellitus without complications: Secondary | ICD-10-CM | POA: Insufficient documentation

## 2015-07-01 DIAGNOSIS — R5383 Other fatigue: Secondary | ICD-10-CM | POA: Diagnosis not present

## 2015-07-01 DIAGNOSIS — Z8711 Personal history of peptic ulcer disease: Secondary | ICD-10-CM | POA: Insufficient documentation

## 2015-07-01 DIAGNOSIS — N400 Enlarged prostate without lower urinary tract symptoms: Secondary | ICD-10-CM | POA: Diagnosis not present

## 2015-07-01 DIAGNOSIS — Z8701 Personal history of pneumonia (recurrent): Secondary | ICD-10-CM | POA: Insufficient documentation

## 2015-07-01 DIAGNOSIS — Z87891 Personal history of nicotine dependence: Secondary | ICD-10-CM | POA: Diagnosis not present

## 2015-07-01 DIAGNOSIS — Z8673 Personal history of transient ischemic attack (TIA), and cerebral infarction without residual deficits: Secondary | ICD-10-CM | POA: Insufficient documentation

## 2015-07-01 DIAGNOSIS — Z951 Presence of aortocoronary bypass graft: Secondary | ICD-10-CM | POA: Insufficient documentation

## 2015-07-01 DIAGNOSIS — Z9861 Coronary angioplasty status: Secondary | ICD-10-CM | POA: Insufficient documentation

## 2015-07-01 DIAGNOSIS — K219 Gastro-esophageal reflux disease without esophagitis: Secondary | ICD-10-CM | POA: Insufficient documentation

## 2015-07-01 DIAGNOSIS — F419 Anxiety disorder, unspecified: Secondary | ICD-10-CM | POA: Diagnosis not present

## 2015-07-01 DIAGNOSIS — E785 Hyperlipidemia, unspecified: Secondary | ICD-10-CM | POA: Insufficient documentation

## 2015-07-01 DIAGNOSIS — R079 Chest pain, unspecified: Secondary | ICD-10-CM | POA: Insufficient documentation

## 2015-07-01 DIAGNOSIS — Z79899 Other long term (current) drug therapy: Secondary | ICD-10-CM | POA: Insufficient documentation

## 2015-07-01 HISTORY — DX: Chest pain, unspecified: R07.9

## 2015-07-01 HISTORY — DX: Other chronic pain: G89.29

## 2015-07-01 LAB — TROPONIN I: Troponin I: <0.03 ng/mL (ref <0.031)

## 2015-07-01 NOTE — ED Notes (Signed)
Pt states he started having chest pain around 1845 and that his BP was "up" at home and he "just felt bad".

## 2015-07-01 NOTE — ED Provider Notes (Signed)
CSN: [id number]     Arrival date & time 07/01/15  1853 History   First MD Initiated Contact with Patient 07/01/15 2013     Chief Complaint  Patient presents with  . Chest Pain    Patient is a 81 y.o. male presenting with chest pain. The history is provided by the patient.  Chest Pain Pain location:  L chest Pain quality: aching   Radiates to: right chest. Pain severity:  Moderate Onset quality:  Gradual Progression:  Resolved Chronicity:  Recurrent Relieved by: arriving to the ER. Worsened by:  Nothing tried Associated symptoms: fatigue   Associated symptoms: no fever, no shortness of breath and not vomiting   pt reports he was at home going to bed when he "felt bad" and thought his BP was elevated He decided to come to the ER and he had episode of left sided CP that radiated into right chest As soon as he arrived to the ER he felt improved He is now at baseline This is similar to prior episodes  Past Medical History  Diagnosis Date  . Diabetes mellitus, type II ([place])   . Hyperlipidemia   . Coronary atherosclerosis of native coronary artery     a. CABG x 4 in 1989 (VG->OM1->OM2, VG->RCA, LIMA->LAD), b. 05/2010: DES to VG-OM1/OM2, DES to distal LCx. c. NSTEMI in 04/2011 - TO distal LCX stent and VG->OM2. d. 02/2012 NSTEMI DES to VG-OM1/continuation to OM2 occluded. e. inferior STEMI s/p DES to SVG-RAMUS 06/2012. f. inferolat STEMI 09/2012 s/p DES to SVG-interm; g. Lex MV (11/14):  EF 35%, inf-lat scar with small peri-infarct ischemia  . Essential hypertension, benign   . Osteoarthritis   . History of stroke   . History of pneumonia   . Cervical vertebral fracture ([place])   . Chronic back pain   . Benign prostatic hypertrophy     History of urinary retention  . Peptic ulcer disease   . Gastroesophageal reflux disease   . Ischemic cardiomyopathy Nov 2015    EF 35% cath, 45-50% by echo  . Hypothyroidism   . Anxiety about health     "multiple somatic complaints"  . Chronic  chest pain    Past Surgical History  Procedure Laterality Date  . Tonsillectomy    . Coronary artery bypass graft  1989  . Coronary angioplasty  10/12, 8/13, 12/13, 3/14    SVG-OM PCI  . Cardiac catheterization  05/19/14    SVG-OM occl- medical Rx  . Left heart catheterization with coronary angiogram N/A 11/01/2011    Procedure: LEFT HEART CATHETERIZATION WITH CORONARY ANGIOGRAM;  Surgeon: Lorretta Harp, MD;  Location: [facility] CATH LAB;  Service: Cardiovascular;  Laterality: N/A;  . Percutaneous coronary stent intervention (pci-s) N/A 11/01/2011    Procedure: PERCUTANEOUS CORONARY STENT INTERVENTION (PCI-S);  Surgeon: Lorretta Harp, MD;  Location: [facility] CATH LAB;  Service: Cardiovascular;  Laterality: N/A;  . Left heart catheterization with coronary/graft angiogram N/A 03/10/2012    Procedure: LEFT HEART CATHETERIZATION WITH [name];  Surgeon: Sherren Mocha, MD;  Location: [facility] CATH LAB;  Service: Cardiovascular;  Laterality: N/A;  . Left heart catheterization with coronary angiogram N/A 06/18/2012    Procedure: LEFT HEART CATHETERIZATION WITH CORONARY ANGIOGRAM;  Surgeon: Peter M Martinique, MD;  Location: [facility] CATH LAB;  Service: Cardiovascular;  Laterality: N/A;  . Percutaneous coronary stent intervention (pci-s)  06/18/2012    Procedure: PERCUTANEOUS CORONARY STENT INTERVENTION (PCI-S);  Surgeon: Peter M Martinique, MD;  Location: [facility] CATH LAB;  Service:  Cardiovascular;;  . Left heart catheterization with coronary/graft angiogram  10/06/2012    Procedure: LEFT HEART CATHETERIZATION WITH [name];  Surgeon: Burnell Blanks, MD;  Location: [facility] CATH LAB;  Service: Cardiovascular;;  . Percutaneous coronary stent intervention (pci-s)  10/06/2012    Procedure: PERCUTANEOUS CORONARY STENT INTERVENTION (PCI-S);  Surgeon: Burnell Blanks, MD;  Location: [facility] CATH LAB;  Service: Cardiovascular;;  . Left heart catheterization with coronary/graft angiogram N/A 11/09/2012     Procedure: LEFT HEART CATHETERIZATION WITH [name];  Surgeon: Peter M Martinique, MD;  Location: [facility] CATH LAB;  Service: Cardiovascular;  Laterality: N/A;  . Left heart catheterization with coronary/graft angiogram N/A 05/19/2014    Procedure: LEFT HEART CATHETERIZATION WITH [name];  Surgeon: Troy Sine, MD;  Location: [facility] CATH LAB;  Service: Cardiovascular;  Laterality: N/A;   Family History  Problem Relation Age of Onset  . Early death      Parents died young  . Appendicitis Mother     Pt was 11 year old  . Heart attack Father 9   Social History  Substance Use Topics  . Smoking status: Former Smoker -- 2.00 packs/day for 10 years    Types: Cigarettes    Start date: 07/14/1950    Quit date: 07/14/1961  . Smokeless tobacco: Current User    Types: Chew  . Alcohol Use: No    Review of Systems  Constitutional: Positive for fatigue. Negative for fever.  Respiratory: Negative for shortness of breath.   Cardiovascular: Positive for chest pain.  Gastrointestinal: Negative for vomiting.  Neurological: Negative for syncope.  Psychiatric/Behavioral: The patient is nervous/anxious.   All other systems reviewed and are negative.     Allergies  Review of patient's allergies indicates no known allergies.  Home Medications   Prior to Admission medications   Medication Sig Start Date End Date Taking? Authorizing Provider  acetaminophen (TYLENOL) 325 MG tablet Take 2 tablets (650 mg total) by mouth every 4 (four) hours as needed for headache or mild pain. Patient taking differently: Take 325 mg by mouth 2 (two) times daily as needed for headache or mild pain.  05/21/14  Yes Luke K Kilroy, PA-C  acyclovir ointment (ZOVIRAX) 5 % Apply 1 application topically every 3 (three) hours. 06/29/15  Yes Leonard Schwartz, MD  albuterol (PROVENTIL HFA;VENTOLIN HFA) 108 (90 BASE) MCG/ACT inhaler Inhale 2 puffs into the lungs every 6 (six) hours as needed for wheezing or  shortness of breath.   Yes Historical Provider, MD  aspirin 325 MG tablet Take 325 mg by mouth daily as needed for moderate pain.   Yes Historical Provider, MD  aspirin EC 81 MG tablet Take 81 mg by mouth daily. Reported on 06/29/2015   Yes Historical Provider, MD  atorvastatin (LIPITOR) 40 MG tablet Take 1 tablet (40 mg total) by mouth daily at 6 PM. 05/29/13  Yes Scott T Kathlen Mody, PA-C  bismuth subsalicylate (PEPTO BISMOL) 262 MG/15ML suspension Take 30 mLs by mouth every 6 (six) hours as needed for indigestion or diarrhea or loose stools.    Yes Historical Provider, MD  BRILINTA 90 MG TABS tablet TAKE 1 TABLET BY MOUTH TWICE DAILY. 05/15/15  Yes Lendon Colonel, NP  carvedilol (COREG) 3.125 MG tablet TAKE 1 TABLET BY MOUTH TWICE DAILY WITH MEALS. (HEART RATE & BLOOD PRESSURE) 03/05/15  Yes Lendon Colonel, NP  famotidine (PEPCID) 20 MG tablet Take 1 tablet (20 mg total) by mouth at bedtime. 03/03/15  Yes Rexene Alberts, MD  isosorbide mononitrate (IMDUR) 30 MG 24 hr tablet Take 1.5 tablets (45 mg total) by mouth daily. 03/03/15  Yes Rexene Alberts, MD  levothyroxine (SYNTHROID, LEVOTHROID) 50 MCG tablet Take 50 mcg by mouth daily before breakfast.  09/08/14  Yes Historical Provider, MD  loperamide (IMODIUM) 2 MG capsule Take 1 capsule (2 mg total) by mouth 4 (four) times daily as needed for diarrhea or loose stools. 04/01/15  Yes Kristen N Ward, DO  metFORMIN (GLUCOPHAGE) 500 MG tablet Take 2 tablets (1,000 mg total) by mouth 2 (two) times daily with a meal. RESTART ON Sunday, 03/04/15. Patient taking differently: Take 1,000 mg by mouth 2 (two) times daily with a meal.  03/03/15  Yes Rexene Alberts, MD  Multiple Vitamin (MULTIVITAMIN WITH MINERALS) TABS Take 1 tablet by mouth daily.   Yes Historical Provider, MD  tamsulosin (FLOMAX) 0.4 MG CAPS Take 0.4 mg by mouth daily after supper.  01/27/13  Yes Historical Provider, MD  furosemide (LASIX) 20 MG tablet Take 20 mg by mouth as needed for fluid. Reported  on 06/29/2015 05/06/13   Lendon Colonel, NP  nitroGLYCERIN (NITROSTAT) 0.4 MG SL tablet Place 0.4 mg under the tongue every 5 (five) minutes as needed for chest pain.    Historical Provider, MD  potassium chloride (K-DUR) 10 MEQ tablet Take 10 mEq by mouth as needed. Takes along with the Furosemide when he has swelling.    Historical Provider, MD   BP 165/86 mmHg  Pulse 91  Temp(Src) 97.8 F (36.6 C) (Oral)  Resp 20  Ht 5\' 10"  (1.778 m)  Wt 77.111 kg  BMI 24.39 kg/m2  SpO2 98% Physical Exam CONSTITUTIONAL: elderly, mildly anxious HEAD: Normocephalic/atraumatic EYES: EOMI ENMT: Mucous membranes moist NECK: supple no meningeal signs CV: S1/S2 noted, no murmurs/rubs/gallops noted LUNGS: Lungs are clear to auscultation bilaterally, no apparent distress ABDOMEN: soft, nontender NEURO: Pt is awake/alert/appropriate, moves all extremitiesx4.   EXTREMITIES: pulses normal/equal, full ROM SKIN: warm, color normal PSYCH: anxious   ED Course  Procedures  Pt with repeat ED visit for CP He is now CP free Reports it resolved upon arrival to the ED Will d/c home    EKG Interpretation   Date/Time:  Sunday July 01 2015 18:57:55 EST Ventricular Rate:  87 PR Interval:  196 QRS Duration: 112 QT Interval:  406 QTC Calculation: 488 R Axis:   92 Text Interpretation:  Normal sinus rhythm Rightward axis Nonspecific ST  and T wave abnormality Abnormal ECG No significant change since last  tracing Confirmed by Christy Gentles  MD, Elenore Rota ([id number]) on 07/01/2015 8:12:05 PM      MDM   Final diagnoses:  None    Nursing notes including past medical history and social history reviewed and considered in documentation Labs/vital reviewed myself and considered during evaluation Previous records reviewed and considered     Ripley Fraise, MD 07/01/15 2105

## 2015-07-04 ENCOUNTER — Other Ambulatory Visit: Payer: Self-pay | Admitting: Cardiology

## 2015-07-06 ENCOUNTER — Emergency Department (HOSPITAL_COMMUNITY)
Admission: EM | Admit: 2015-07-06 | Discharge: 2015-07-06 | Disposition: A | Discharge status: Home / Self Care | Payer: Medicare Other | Attending: Emergency Medicine | Admitting: Emergency Medicine

## 2015-07-06 ENCOUNTER — Emergency Department (HOSPITAL_COMMUNITY): Payer: Medicare Other

## 2015-07-06 ENCOUNTER — Encounter (HOSPITAL_COMMUNITY): Payer: Self-pay | Admitting: *Deleted

## 2015-07-06 DIAGNOSIS — E119 Type 2 diabetes mellitus without complications: Secondary | ICD-10-CM | POA: Insufficient documentation

## 2015-07-06 DIAGNOSIS — Z79899 Other long term (current) drug therapy: Secondary | ICD-10-CM | POA: Diagnosis not present

## 2015-07-06 DIAGNOSIS — Z8719 Personal history of other diseases of the digestive system: Secondary | ICD-10-CM | POA: Insufficient documentation

## 2015-07-06 DIAGNOSIS — Z8673 Personal history of transient ischemic attack (TIA), and cerebral infarction without residual deficits: Secondary | ICD-10-CM | POA: Diagnosis not present

## 2015-07-06 DIAGNOSIS — Z8781 Personal history of (healed) traumatic fracture: Secondary | ICD-10-CM | POA: Diagnosis not present

## 2015-07-06 DIAGNOSIS — G8929 Other chronic pain: Secondary | ICD-10-CM | POA: Insufficient documentation

## 2015-07-06 DIAGNOSIS — I10 Essential (primary) hypertension: Secondary | ICD-10-CM | POA: Diagnosis not present

## 2015-07-06 DIAGNOSIS — Z87891 Personal history of nicotine dependence: Secondary | ICD-10-CM | POA: Diagnosis not present

## 2015-07-06 DIAGNOSIS — Z7984 Long term (current) use of oral hypoglycemic drugs: Secondary | ICD-10-CM | POA: Insufficient documentation

## 2015-07-06 DIAGNOSIS — N400 Enlarged prostate without lower urinary tract symptoms: Secondary | ICD-10-CM | POA: Diagnosis not present

## 2015-07-06 DIAGNOSIS — Z8701 Personal history of pneumonia (recurrent): Secondary | ICD-10-CM | POA: Diagnosis not present

## 2015-07-06 DIAGNOSIS — R079 Chest pain, unspecified: Secondary | ICD-10-CM | POA: Insufficient documentation

## 2015-07-06 DIAGNOSIS — E039 Hypothyroidism, unspecified: Secondary | ICD-10-CM | POA: Diagnosis not present

## 2015-07-06 DIAGNOSIS — I2510 Atherosclerotic heart disease of native coronary artery without angina pectoris: Secondary | ICD-10-CM | POA: Insufficient documentation

## 2015-07-06 DIAGNOSIS — Z8711 Personal history of peptic ulcer disease: Secondary | ICD-10-CM | POA: Insufficient documentation

## 2015-07-06 DIAGNOSIS — Z8659 Personal history of other mental and behavioral disorders: Secondary | ICD-10-CM | POA: Diagnosis not present

## 2015-07-06 DIAGNOSIS — E785 Hyperlipidemia, unspecified: Secondary | ICD-10-CM | POA: Diagnosis not present

## 2015-07-06 LAB — CBC WITH DIFFERENTIAL/PLATELET
Basophils Absolute: 0 10*3/uL (ref 0.0–0.1)
Basophils Relative: 0 %
EOS ABS: 0.3 10*3/uL (ref 0.0–0.7)
Eosinophils Relative: 4 %
HCT: 40.3 % (ref 39.0–52.0)
HEMOGLOBIN: 13.7 g/dL (ref 13.0–17.0)
LYMPHS ABS: 1.5 10*3/uL (ref 0.7–4.0)
LYMPHS PCT: 24 %
MCH: 32.2 pg (ref 26.0–34.0)
MCHC: 34 g/dL (ref 30.0–36.0)
MCV: 94.8 fL (ref 78.0–100.0)
MONOS PCT: 9 %
Monocytes Absolute: 0.6 10*3/uL (ref 0.1–1.0)
NEUTROS PCT: 63 %
Neutro Abs: 4.1 10*3/uL (ref 1.7–7.7)
PLATELETS: 142 10*3/uL — AB (ref 150–400)
RBC: 4.25 MIL/uL (ref 4.22–5.81)
RDW: 12.5 % (ref 11.5–15.5)
WBC: 6.4 10*3/uL (ref 4.0–10.5)

## 2015-07-06 LAB — BASIC METABOLIC PANEL
Anion gap: 8 (ref 5–15)
BUN: 13 mg/dL (ref 6–20)
CHLORIDE: 105 mmol/L (ref 101–111)
CO2: 24 mmol/L (ref 22–32)
CREATININE: 0.78 mg/dL (ref 0.61–1.24)
Calcium: 9.2 mg/dL (ref 8.9–10.3)
Glucose, Bld: 243 mg/dL — ABNORMAL HIGH (ref 65–99)
Potassium: 3.9 mmol/L (ref 3.5–5.1)
SODIUM: 137 mmol/L (ref 135–145)

## 2015-07-06 LAB — TROPONIN I

## 2015-07-06 MED ORDER — ASPIRIN 81 MG PO CHEW
324.0000 mg | CHEWABLE_TABLET | Freq: Once | ORAL | Status: AC
  Administered 2015-07-06: 324 mg via ORAL
  Filled 2015-07-06: qty 4

## 2015-07-06 NOTE — ED Notes (Signed)
Pt reports recurrent chest pains that started around 0330

## 2015-07-06 NOTE — ED Provider Notes (Signed)
TIME SEEN: 5:00 AM  CHIEF COMPLAINT: Chest pain  HPI: Pt is a 81 y.o. male with history of CAD status post four-vessel CABG in 1989, multiple drug-eluting stents, hypertension, hyperlipidemia, diabetes, ischemic cardiomyopathy who presents to the emergency department with complaints of diffuse anterior chest pain that he describes as "indigestion". That started around 3:30 AM. Did have pain radiating into his left arm but no tingling like he has had previously with his anginal equivalent. No shortness of breath, nausea, vomiting, diaphoresis or dizziness. States he took Pepto-Bismol at home and now his pain is completely gone. He is here frequently in the emergency department for complaints of chest pain. He states "when you live at home alone you just get scared". He is followed by a lamellar cardiology in [place]. Last cardiac catheterization was in November 2015. It appears he is on Imdur, aspirin, Brilinta.  ROS: See HPI Constitutional: no fever  Eyes: no drainage  ENT: no runny nose   Cardiovascular:  no chest pain  Resp: no SOB  GI: no vomiting GU: no dysuria Integumentary: no rash  Allergy: no hives  Musculoskeletal: no leg swelling  Neurological: no slurred speech ROS otherwise negative  PAST MEDICAL HISTORY/PAST SURGICAL HISTORY:  Past Medical History  Diagnosis Date  . Diabetes mellitus, type II ([place])   . Hyperlipidemia   . Coronary atherosclerosis of native coronary artery     a. CABG x 4 in 1989 (VG->OM1->OM2, VG->RCA, LIMA->LAD), b. 05/2010: DES to VG-OM1/OM2, DES to distal LCx. c. NSTEMI in 04/2011 - TO distal LCX stent and VG->OM2. d. 02/2012 NSTEMI DES to VG-OM1/continuation to OM2 occluded. e. inferior STEMI s/p DES to SVG-RAMUS 06/2012. f. inferolat STEMI 09/2012 s/p DES to SVG-interm; g. Lex MV (11/14):  EF 35%, inf-lat scar with small peri-infarct ischemia  . Essential hypertension, benign   . Osteoarthritis   . History of stroke   . History of pneumonia   .  Cervical vertebral fracture ([place])   . Chronic back pain   . Benign prostatic hypertrophy     History of urinary retention  . Peptic ulcer disease   . Gastroesophageal reflux disease   . Ischemic cardiomyopathy Nov 2015    EF 35% cath, 45-50% by echo  . Hypothyroidism   . Anxiety about health     "multiple somatic complaints"  . Chronic chest pain     MEDICATIONS:  Prior to Admission medications   Medication Sig Start Date End Date Taking? Authorizing Provider  acetaminophen (TYLENOL) 325 MG tablet Take 2 tablets (650 mg total) by mouth every 4 (four) hours as needed for headache or mild pain. Patient taking differently: Take 325 mg by mouth 2 (two) times daily as needed for headache or mild pain.  05/21/14  Yes Luke K Kilroy, PA-C  acyclovir ointment (ZOVIRAX) 5 % Apply 1 application topically every 3 (three) hours. 06/29/15  Yes Leonard Schwartz, MD  albuterol (PROVENTIL HFA;VENTOLIN HFA) 108 (90 BASE) MCG/ACT inhaler Inhale 2 puffs into the lungs every 6 (six) hours as needed for wheezing or shortness of breath.   Yes Historical Provider, MD  aspirin 325 MG tablet Take 325 mg by mouth daily as needed for moderate pain.   Yes Historical Provider, MD  aspirin EC 81 MG tablet Take 81 mg by mouth daily. Reported on 06/29/2015   Yes Historical Provider, MD  atorvastatin (LIPITOR) 40 MG tablet Take 1 tablet (40 mg total) by mouth daily at 6 PM. 05/29/13  Yes Liliane Shi, PA-C  bismuth subsalicylate (PEPTO BISMOL) 262 MG/15ML suspension Take 30 mLs by mouth every 6 (six) hours as needed for indigestion or diarrhea or loose stools.    Yes Historical Provider, MD  BRILINTA 90 MG TABS tablet TAKE 1 TABLET BY MOUTH TWICE DAILY. 05/15/15  Yes Lendon Colonel, NP  carvedilol (COREG) 3.125 MG tablet TAKE 1 TABLET BY MOUTH TWICE DAILY WITH MEALS. (HEART RATE & BLOOD PRESSURE) 03/05/15  Yes Lendon Colonel, NP  famotidine (PEPCID) 20 MG tablet Take 1 tablet (20 mg total) by mouth at bedtime. 03/03/15   Yes Rexene Alberts, MD  furosemide (LASIX) 20 MG tablet Take 20 mg by mouth as needed for fluid. Reported on 06/29/2015 05/06/13  Yes Lendon Colonel, NP  isosorbide mononitrate (IMDUR) 30 MG 24 hr tablet Take 1.5 tablets (45 mg total) by mouth daily. 03/03/15  Yes Rexene Alberts, MD  levothyroxine (SYNTHROID, LEVOTHROID) 50 MCG tablet Take 50 mcg by mouth daily before breakfast.  09/08/14  Yes Historical Provider, MD  loperamide (IMODIUM) 2 MG capsule Take 1 capsule (2 mg total) by mouth 4 (four) times daily as needed for diarrhea or loose stools. 04/01/15  Yes Deeanne Deininger N Annabeth Tortora, DO  metFORMIN (GLUCOPHAGE) 500 MG tablet Take 2 tablets (1,000 mg total) by mouth 2 (two) times daily with a meal. RESTART ON Sunday, 03/04/15. Patient taking differently: Take 1,000 mg by mouth 2 (two) times daily with a meal.  03/03/15  Yes Rexene Alberts, MD  Multiple Vitamin (MULTIVITAMIN WITH MINERALS) TABS Take 1 tablet by mouth daily.   Yes Historical Provider, MD  NITROSTAT 0.4 MG SL tablet PLACE 1 TAB UNDER TONGUE EVERY 5 MIN IF NEEDED FOR CHEST PAIN. MAY USE 3 TIMES.NO RELIEF CALL 911. 07/04/15  Yes Lendon Colonel, NP  potassium chloride (K-DUR) 10 MEQ tablet Take 10 mEq by mouth as needed. Takes along with the Furosemide when he has swelling.   Yes Historical Provider, MD  tamsulosin (FLOMAX) 0.4 MG CAPS Take 0.4 mg by mouth daily after supper.  01/27/13  Yes Historical Provider, MD    ALLERGIES:  No Known Allergies  SOCIAL HISTORY:  Social History  Substance Use Topics  . Smoking status: Former Smoker -- 2.00 packs/day for 10 years    Types: Cigarettes    Start date: 07/14/1950    Quit date: 07/14/1961  . Smokeless tobacco: Current User    Types: Chew  . Alcohol Use: No    FAMILY HISTORY: Family History  Problem Relation Age of Onset  . Early death      Parents died young  . Appendicitis Mother     Pt was 19 year old  . Heart attack Father 40    EXAM: BP 148/79 mmHg  Pulse 73  Temp(Src) 98.1  F (36.7 C) (Oral)  Resp 18  Ht 5\' 10"  (1.778 m)  Wt 170 lb (77.111 kg)  BMI 24.39 kg/m2  SpO2 97% CONSTITUTIONAL: Alert and oriented and responds appropriately to questions. Well-appearing; well-nourished, elderly, in no distress  HEAD: Normocephalic EYES: Conjunctivae clear, PERRL ENT: normal nose; no rhinorrhea; moist mucous membranes; pharynx without lesions noted NECK: Supple, no meningismus, no LAD  CARD: RRR; S1 and S2 appreciated; no murmurs, no clicks, no rubs, no gallops RESP: Normal chest excursion without splinting or tachypnea; breath sounds clear and equal bilaterally; no wheezes, no rhonchi, no rales, no hypoxia or respiratory distress, speaking full sentences ABD/GI: Normal bowel sounds; non-distended; soft, non-tender, no rebound, no guarding, no peritoneal signs BACK:  The back appears normal and is non-tender to palpation, there is no CVA tenderness EXT: Normal ROM in all joints; non-tender to palpation; no edema; normal capillary refill; no cyanosis, no calf tenderness or swelling    SKIN: Normal color for age and race; warm NEURO: Moves all extremities equally, sensation to light touch intact diffusely, cranial nerves II through XII intact PSYCH: The patient's mood and manner are appropriate. Grooming and personal hygiene are appropriate.  MEDICAL DECISION MAKING: Patient here with chest pain. Does have a significant coronary artery history and is a very poor historian. He has frequently here in the emergency department for similar symptoms. I suspect some of his symptoms are secondary to anxiety, dyspepsia/GERD. EKG shows no new ischemic changes. He is currently chest pain-free. Abdominal exam is benign. We'll obtain cardiac labs, chest x-ray. We'll continue to monitor patient. Will give dose of aspirin in the emergency department.  ED PROGRESS: 7:00 AM  Pt's labs unremarkable other than mildly elevated glucose with no signs of DKA. He is on metformin. First troponin  negative. Will repeat second troponin at 9:30 AM, 6 hours after the onset of symptoms. If this is negative I feel he can be discharged home with close outpatient follow-up. Patient is comfortable with this plan. Patient is still pain-free. Chest x-ray appears clear with no infiltrate, edema or pneumothorax.   8:00 AM  Pt still chest pain-free and hemodynamically stable. He is aware of plan that if second troponin is negative he will be discharged with outpatient follow-up. Discussed return precautions. He verbalized understanding and is comfortable with this plan.   EKG Interpretation  Date/Time:  Friday July 06 2015 05:02:46 EST Ventricular Rate:  79 PR Interval:  203 QRS Duration: 110 QT Interval:  405 QTC Calculation: 464 R Axis:   99 Text Interpretation:  Sinus rhythm Posterior infarct, old Nonspecific repol abnormality, diffuse leads No significant change since last tracing Confirmed by Jarion Hawthorne,  DO, Masha Orbach [phone]) on 07/06/2015 5:09:18 AM          [place], DO 07/06/15 [id number]

## 2015-07-06 NOTE — Discharge Instructions (Signed)
Please continue your medications as prescribed especially your Imdur, aspirin and Brilinta. Please make an appointment to see your cardiologist for your chronic chest pain.  Nonspecific Chest Pain  Chest pain can be caused by many different conditions. There is always a chance that your pain could be related to something serious, such as a heart attack or a blood clot in your lungs. Chest pain can also be caused by conditions that are not life-threatening. If you have chest pain, it is very important to follow up with your health care provider. CAUSES  Chest pain can be caused by:  Heartburn.  Pneumonia or bronchitis.  Anxiety or stress.  Inflammation around your heart (pericarditis) or lung (pleuritis or pleurisy).  A blood clot in your lung.  A collapsed lung (pneumothorax). It can develop suddenly on its own (spontaneous pneumothorax) or from trauma to the chest.  Shingles infection (varicella-zoster virus).  Heart attack.  Damage to the bones, muscles, and cartilage that make up your chest wall. This can include:  Bruised bones due to injury.  Strained muscles or cartilage due to frequent or repeated coughing or overwork.  Fracture to one or more ribs.  Sore cartilage due to inflammation (costochondritis). RISK FACTORS  Risk factors for chest pain may include:  Activities that increase your risk for trauma or injury to your chest.  Respiratory infections or conditions that cause frequent coughing.  Medical conditions or overeating that can cause heartburn.  Heart disease or family history of heart disease.  Conditions or health behaviors that increase your risk of developing a blood clot.  Having had chicken pox (varicella zoster). SIGNS AND SYMPTOMS Chest pain can feel like:  Burning or tingling on the surface of your chest or deep in your chest.  Crushing, pressure, aching, or squeezing pain.  Dull or sharp pain that is worse when you move, cough, or take a  deep breath.  Pain that is also felt in your back, neck, shoulder, or arm, or pain that spreads to any of these areas. Your chest pain may come and go, or it may stay constant. DIAGNOSIS Lab tests or other studies may be needed to find the cause of your pain. Your health care provider may have you take a test called an ambulatory ECG (electrocardiogram). An ECG records your heartbeat patterns at the time the test is performed. You may also have other tests, such as:  Transthoracic echocardiogram (TTE). During echocardiography, sound waves are used to create a picture of all of the heart structures and to look at how blood flows through your heart.  Transesophageal echocardiogram (TEE).This is a more advanced imaging test that obtains images from inside your body. It allows your health care provider to see your heart in finer detail.  Cardiac monitoring. This allows your health care provider to monitor your heart rate and rhythm in real time.  Holter monitor. This is a portable device that records your heartbeat and can help to diagnose abnormal heartbeats. It allows your health care provider to track your heart activity for several days, if needed.  Stress tests. These can be done through exercise or by taking medicine that makes your heart beat more quickly.  Blood tests.  Imaging tests. TREATMENT  Your treatment depends on what is causing your chest pain. Treatment may include:  Medicines. These may include:  Acid blockers for heartburn.  Anti-inflammatory medicine.  Pain medicine for inflammatory conditions.  Antibiotic medicine, if an infection is present.  Medicines to dissolve blood  clots.  Medicines to treat coronary artery disease.  Supportive care for conditions that do not require medicines. This may include:  Resting.  Applying heat or cold packs to injured areas.  Limiting activities until pain decreases. HOME CARE INSTRUCTIONS  If you were prescribed an  antibiotic medicine, finish it all even if you start to feel better.  Avoid any activities that bring on chest pain.  Do not use any tobacco products, including cigarettes, chewing tobacco, or electronic cigarettes. If you need help quitting, ask your health care provider.  Do not drink alcohol.  Take medicines only as directed by your health care provider.  Keep all follow-up visits as directed by your health care provider. This is important. This includes any further testing if your chest pain does not go away.  If heartburn is the cause for your chest pain, you may be told to keep your head raised (elevated) while sleeping. This reduces the chance that acid will go from your stomach into your esophagus.  Make lifestyle changes as directed by your health care provider. These may include:  Getting regular exercise. Ask your health care provider to suggest some activities that are safe for you.  Eating a heart-healthy diet. A registered dietitian can help you to learn healthy eating options.  Maintaining a healthy weight.  Managing diabetes, if necessary.  Reducing stress. SEEK MEDICAL CARE IF:  Your chest pain does not go away after treatment.  You have a rash with blisters on your chest.  You have a fever. SEEK IMMEDIATE MEDICAL CARE IF:   Your chest pain is worse.  You have an increasing cough, or you cough up blood.  You have severe abdominal pain.  You have severe weakness.  You faint.  You have chills.  You have sudden, unexplained chest discomfort.  You have sudden, unexplained discomfort in your arms, back, neck, or jaw.  You have shortness of breath at any time.  You suddenly start to sweat, or your skin gets clammy.  You feel nauseous or you vomit.  You suddenly feel light-headed or dizzy.  Your heart begins to beat quickly, or it feels like it is skipping beats. These symptoms may represent a serious problem that is an emergency. Do not wait to  see if the symptoms will go away. Get medical help right away. Call your local emergency services (911 in the [place] Do not drive yourself to the hospital.   This information is not intended to replace advice given to you by your health care provider. Make sure you discuss any questions you have with your health care provider.   Document Released: 04/09/2005 Document Revised: 07/21/2014 Document Reviewed: 02/03/2014 Elsevier Interactive Patient Education [employer].

## 2015-07-10 ENCOUNTER — Other Ambulatory Visit: Payer: Self-pay

## 2015-07-10 ENCOUNTER — Encounter (HOSPITAL_COMMUNITY): Payer: Self-pay | Admitting: *Deleted

## 2015-07-10 ENCOUNTER — Emergency Department (HOSPITAL_COMMUNITY)
Admission: EM | Admit: 2015-07-10 | Discharge: 2015-07-10 | Disposition: A | Discharge status: Home / Self Care | Payer: Medicare Other | Attending: Emergency Medicine | Admitting: Emergency Medicine

## 2015-07-10 DIAGNOSIS — R079 Chest pain, unspecified: Secondary | ICD-10-CM

## 2015-07-10 DIAGNOSIS — G8929 Other chronic pain: Secondary | ICD-10-CM | POA: Insufficient documentation

## 2015-07-10 DIAGNOSIS — Z8673 Personal history of transient ischemic attack (TIA), and cerebral infarction without residual deficits: Secondary | ICD-10-CM | POA: Insufficient documentation

## 2015-07-10 DIAGNOSIS — K219 Gastro-esophageal reflux disease without esophagitis: Secondary | ICD-10-CM | POA: Insufficient documentation

## 2015-07-10 DIAGNOSIS — E039 Hypothyroidism, unspecified: Secondary | ICD-10-CM | POA: Insufficient documentation

## 2015-07-10 DIAGNOSIS — Z951 Presence of aortocoronary bypass graft: Secondary | ICD-10-CM | POA: Diagnosis not present

## 2015-07-10 DIAGNOSIS — Z9861 Coronary angioplasty status: Secondary | ICD-10-CM | POA: Insufficient documentation

## 2015-07-10 DIAGNOSIS — N400 Enlarged prostate without lower urinary tract symptoms: Secondary | ICD-10-CM | POA: Insufficient documentation

## 2015-07-10 DIAGNOSIS — Z79899 Other long term (current) drug therapy: Secondary | ICD-10-CM | POA: Insufficient documentation

## 2015-07-10 DIAGNOSIS — E119 Type 2 diabetes mellitus without complications: Secondary | ICD-10-CM | POA: Diagnosis not present

## 2015-07-10 DIAGNOSIS — I10 Essential (primary) hypertension: Secondary | ICD-10-CM | POA: Insufficient documentation

## 2015-07-10 DIAGNOSIS — Z8701 Personal history of pneumonia (recurrent): Secondary | ICD-10-CM | POA: Diagnosis not present

## 2015-07-10 DIAGNOSIS — Z87891 Personal history of nicotine dependence: Secondary | ICD-10-CM | POA: Diagnosis not present

## 2015-07-10 DIAGNOSIS — I2510 Atherosclerotic heart disease of native coronary artery without angina pectoris: Secondary | ICD-10-CM | POA: Insufficient documentation

## 2015-07-10 DIAGNOSIS — Z8781 Personal history of (healed) traumatic fracture: Secondary | ICD-10-CM | POA: Insufficient documentation

## 2015-07-10 DIAGNOSIS — Z8711 Personal history of peptic ulcer disease: Secondary | ICD-10-CM | POA: Insufficient documentation

## 2015-07-10 DIAGNOSIS — Z9889 Other specified postprocedural states: Secondary | ICD-10-CM | POA: Diagnosis not present

## 2015-07-10 LAB — CBC WITH DIFFERENTIAL/PLATELET
BASOS ABS: 0 10*3/uL (ref 0.0–0.1)
BASOS PCT: 0 %
EOS ABS: 0.3 10*3/uL (ref 0.0–0.7)
EOS PCT: 4 %
HCT: 39.9 % (ref 39.0–52.0)
Hemoglobin: 13.6 g/dL (ref 13.0–17.0)
Lymphocytes Relative: 22 %
Lymphs Abs: 1.4 10*3/uL (ref 0.7–4.0)
MCH: 32.1 pg (ref 26.0–34.0)
MCHC: 34.1 g/dL (ref 30.0–36.0)
MCV: 94.1 fL (ref 78.0–100.0)
MONO ABS: 0.6 10*3/uL (ref 0.1–1.0)
Monocytes Relative: 9 %
Neutro Abs: 4.4 10*3/uL (ref 1.7–7.7)
Neutrophils Relative %: 65 %
PLATELETS: 158 10*3/uL (ref 150–400)
RBC: 4.24 MIL/uL (ref 4.22–5.81)
RDW: 12.3 % (ref 11.5–15.5)
WBC: 6.7 10*3/uL (ref 4.0–10.5)

## 2015-07-10 LAB — BASIC METABOLIC PANEL
Anion gap: 8 (ref 5–15)
BUN: 14 mg/dL (ref 6–20)
CALCIUM: 9.2 mg/dL (ref 8.9–10.3)
CO2: 25 mmol/L (ref 22–32)
CREATININE: 0.83 mg/dL (ref 0.61–1.24)
Chloride: 104 mmol/L (ref 101–111)
Glucose, Bld: 230 mg/dL — ABNORMAL HIGH (ref 65–99)
Potassium: 3.9 mmol/L (ref 3.5–5.1)
SODIUM: 137 mmol/L (ref 135–145)

## 2015-07-10 LAB — TROPONIN I: Troponin I: <0.03 ng/mL (ref <0.031)

## 2015-07-10 NOTE — ED Notes (Signed)
Pt reports right sided chest pain a couple of hours ago. Denies any pain now.

## 2015-07-10 NOTE — ED Provider Notes (Signed)
CSN: [id number]     Arrival date & time 07/10/15  1356 History   First MD Initiated Contact with Patient 07/10/15 1546     Chief Complaint  Patient presents with  . Chest Pain      HPI Patient presents for evaluation of chest pain. Right-sided chest pain. He waking from a nap about 2 hours ago. When he got up he states he has some discomfort in his right chest. Denies numbness or weakness or tingling or any of his typical anginal equivalents. He checked his blood pressure. He states it was 190/110 and he became concerned and thus he presents here. He is very apologetic about his arrival. He does have frequent ER visits. Has known coronary disease.  He has been asymptomatic for the last 2 hours after less than one half hour of pain. Past Medical History  Diagnosis Date  . Diabetes mellitus, type II ([place])   . Hyperlipidemia   . Coronary atherosclerosis of native coronary artery     a. CABG x 4 in 1989 (VG->OM1->OM2, VG->RCA, LIMA->LAD), b. 05/2010: DES to VG-OM1/OM2, DES to distal LCx. c. NSTEMI in 04/2011 - TO distal LCX stent and VG->OM2. d. 02/2012 NSTEMI DES to VG-OM1/continuation to OM2 occluded. e. inferior STEMI s/p DES to SVG-RAMUS 06/2012. f. inferolat STEMI 09/2012 s/p DES to SVG-interm; g. Lex MV (11/14):  EF 35%, inf-lat scar with small peri-infarct ischemia  . Essential hypertension, benign   . Osteoarthritis   . History of stroke   . History of pneumonia   . Cervical vertebral fracture ([place])   . Chronic back pain   . Benign prostatic hypertrophy     History of urinary retention  . Peptic ulcer disease   . Gastroesophageal reflux disease   . Ischemic cardiomyopathy Nov 2015    EF 35% cath, 45-50% by echo  . Hypothyroidism   . Anxiety about health     "multiple somatic complaints"  . Chronic chest pain    Past Surgical History  Procedure Laterality Date  . Tonsillectomy    . Coronary artery bypass graft  1989  . Coronary angioplasty  10/12, 8/13, 12/13, 3/14     SVG-OM PCI  . Cardiac catheterization  05/19/14    SVG-OM occl- medical Rx  . Left heart catheterization with coronary angiogram N/A 11/01/2011    Procedure: LEFT HEART CATHETERIZATION WITH CORONARY ANGIOGRAM;  Surgeon: Lorretta Harp, MD;  Location: [facility] CATH LAB;  Service: Cardiovascular;  Laterality: N/A;  . Percutaneous coronary stent intervention (pci-s) N/A 11/01/2011    Procedure: PERCUTANEOUS CORONARY STENT INTERVENTION (PCI-S);  Surgeon: Lorretta Harp, MD;  Location: [facility] CATH LAB;  Service: Cardiovascular;  Laterality: N/A;  . Left heart catheterization with coronary/graft angiogram N/A 03/10/2012    Procedure: LEFT HEART CATHETERIZATION WITH [name];  Surgeon: Sherren Mocha, MD;  Location: [facility] CATH LAB;  Service: Cardiovascular;  Laterality: N/A;  . Left heart catheterization with coronary angiogram N/A 06/18/2012    Procedure: LEFT HEART CATHETERIZATION WITH CORONARY ANGIOGRAM;  Surgeon: Peter M Martinique, MD;  Location: [facility] CATH LAB;  Service: Cardiovascular;  Laterality: N/A;  . Percutaneous coronary stent intervention (pci-s)  06/18/2012    Procedure: PERCUTANEOUS CORONARY STENT INTERVENTION (PCI-S);  Surgeon: Peter M Martinique, MD;  Location: [facility] CATH LAB;  Service: Cardiovascular;;  . Left heart catheterization with coronary/graft angiogram  10/06/2012    Procedure: LEFT HEART CATHETERIZATION WITH [name];  Surgeon: Burnell Blanks, MD;  Location: [facility] CATH LAB;  Service: Cardiovascular;;  .  Percutaneous coronary stent intervention (pci-s)  10/06/2012    Procedure: PERCUTANEOUS CORONARY STENT INTERVENTION (PCI-S);  Surgeon: Burnell Blanks, MD;  Location: [facility] CATH LAB;  Service: Cardiovascular;;  . Left heart catheterization with coronary/graft angiogram N/A 11/09/2012    Procedure: LEFT HEART CATHETERIZATION WITH [name];  Surgeon: Peter M Martinique, MD;  Location: [facility] CATH LAB;  Service: Cardiovascular;  Laterality: N/A;  . Left heart  catheterization with coronary/graft angiogram N/A 05/19/2014    Procedure: LEFT HEART CATHETERIZATION WITH [name];  Surgeon: Troy Sine, MD;  Location: [facility] CATH LAB;  Service: Cardiovascular;  Laterality: N/A;   Family History  Problem Relation Age of Onset  . Early death      Parents died young  . Appendicitis Mother     Pt was 18 year old  . Heart attack Father 81   Social History  Substance Use Topics  . Smoking status: Former Smoker -- 2.00 packs/day for 10 years    Types: Cigarettes    Start date: 07/14/1950    Quit date: 07/14/1961  . Smokeless tobacco: Current User    Types: Chew  . Alcohol Use: No    Review of Systems  Constitutional: Negative for fever, chills, diaphoresis, appetite change and fatigue.  HENT: Negative for mouth sores, sore throat and trouble swallowing.   Eyes: Negative for visual disturbance.  Respiratory: Negative for cough, chest tightness, shortness of breath and wheezing.   Cardiovascular: Positive for chest pain.  Gastrointestinal: Negative for nausea, vomiting, abdominal pain, diarrhea and abdominal distention.  Endocrine: Negative for polydipsia, polyphagia and polyuria.  Genitourinary: Negative for dysuria, frequency and hematuria.  Musculoskeletal: Negative for gait problem.  Skin: Negative for color change, pallor and rash.  Neurological: Negative for dizziness, syncope, light-headedness and headaches.  Hematological: Does not bruise/bleed easily.  Psychiatric/Behavioral: Negative for behavioral problems and confusion.      Allergies  Review of patient's allergies indicates no known allergies.  Home Medications   Prior to Admission medications   Medication Sig Start Date End Date Taking? Authorizing Provider  acetaminophen (TYLENOL) 325 MG tablet Take 2 tablets (650 mg total) by mouth every 4 (four) hours as needed for headache or mild pain. Patient taking differently: Take 325 mg by mouth 2 (two) times daily as  needed for headache or mild pain.  05/21/14   Erlene Quan, PA-C  acyclovir ointment (ZOVIRAX) 5 % Apply 1 application topically every 3 (three) hours. 06/29/15   Leonard Schwartz, MD  albuterol (PROVENTIL HFA;VENTOLIN HFA) 108 (90 BASE) MCG/ACT inhaler Inhale 2 puffs into the lungs every 6 (six) hours as needed for wheezing or shortness of breath.    Historical Provider, MD  aspirin 325 MG tablet Take 325 mg by mouth daily as needed for moderate pain.    Historical Provider, MD  aspirin EC 81 MG tablet Take 81 mg by mouth daily. Reported on 06/29/2015    Historical Provider, MD  atorvastatin (LIPITOR) 40 MG tablet Take 1 tablet (40 mg total) by mouth daily at 6 PM. 05/29/13   Liliane Shi, PA-C  bismuth subsalicylate (PEPTO BISMOL) 262 MG/15ML suspension Take 30 mLs by mouth every 6 (six) hours as needed for indigestion or diarrhea or loose stools.     Historical Provider, MD  BRILINTA 90 MG TABS tablet TAKE 1 TABLET BY MOUTH TWICE DAILY. 05/15/15   Lendon Colonel, NP  carvedilol (COREG) 3.125 MG tablet TAKE 1 TABLET BY MOUTH TWICE DAILY WITH MEALS. (HEART RATE &  BLOOD PRESSURE) 03/05/15   Lendon Colonel, NP  famotidine (PEPCID) 20 MG tablet Take 1 tablet (20 mg total) by mouth at bedtime. 03/03/15   Rexene Alberts, MD  furosemide (LASIX) 20 MG tablet Take 20 mg by mouth as needed for fluid. Reported on 06/29/2015 05/06/13   Lendon Colonel, NP  isosorbide mononitrate (IMDUR) 30 MG 24 hr tablet Take 1.5 tablets (45 mg total) by mouth daily. 03/03/15   Rexene Alberts, MD  levothyroxine (SYNTHROID, LEVOTHROID) 50 MCG tablet Take 50 mcg by mouth daily before breakfast.  09/08/14   Historical Provider, MD  loperamide (IMODIUM) 2 MG capsule Take 1 capsule (2 mg total) by mouth 4 (four) times daily as needed for diarrhea or loose stools. 04/01/15   Kristen N Ward, DO  metFORMIN (GLUCOPHAGE) 500 MG tablet Take 2 tablets (1,000 mg total) by mouth 2 (two) times daily with a meal. RESTART ON Sunday,  03/04/15. Patient taking differently: Take 1,000 mg by mouth 2 (two) times daily with a meal.  03/03/15   Rexene Alberts, MD  Multiple Vitamin (MULTIVITAMIN WITH MINERALS) TABS Take 1 tablet by mouth daily.    Historical Provider, MD  NITROSTAT 0.4 MG SL tablet PLACE 1 TAB UNDER TONGUE EVERY 5 MIN IF NEEDED FOR CHEST PAIN. MAY USE 3 TIMES.NO RELIEF CALL 911. 07/04/15   Lendon Colonel, NP  potassium chloride (K-DUR) 10 MEQ tablet Take 10 mEq by mouth as needed. Takes along with the Furosemide when he has swelling.    Historical Provider, MD  tamsulosin (FLOMAX) 0.4 MG CAPS Take 0.4 mg by mouth daily after supper.  01/27/13   Historical Provider, MD   BP 153/73 mmHg  Pulse 90  Temp(Src) 98.7 F (37.1 C) (Oral)  Resp 18  Ht 5\' 10"  (1.778 m)  Wt 170 lb (77.111 kg)  BMI 24.39 kg/m2  SpO2 99% Physical Exam  Constitutional: He is oriented to person, place, and time. He appears well-developed and well-nourished. No distress.  HENT:  Head: Normocephalic.  Eyes: Conjunctivae are normal. Pupils are equal, round, and reactive to light. No scleral icterus.  Neck: Normal range of motion. Neck supple. No thyromegaly present.  Cardiovascular: Normal rate and regular rhythm.  Exam reveals no gallop and no friction rub.   No murmur heard. Pulmonary/Chest: Effort normal and breath sounds normal. No respiratory distress. He has no wheezes. He has no rales.  Abdominal: Soft. Bowel sounds are normal. He exhibits no distension. There is no tenderness. There is no rebound.  Musculoskeletal: Normal range of motion.  Neurological: He is alert and oriented to person, place, and time.  Skin: Skin is warm and dry. No rash noted.  Psychiatric: He has a normal mood and affect. His behavior is normal.    ED Course  Procedures (including critical care time) Labs Review Labs Reviewed  BASIC METABOLIC PANEL - Abnormal; Notable for the following:    Glucose, Bld 230 (*)    All other components within normal limits   CBC WITH DIFFERENTIAL/PLATELET  TROPONIN I    Imaging Review No results found. I have personally reviewed and evaluated these images and lab results as part of my medical decision-making.   EKG Interpretation None     EKG shows inverted T waves inferior and laterally.  Unchanged morphology vs 05/2015 MDM   Final diagnoses:  Chest pain, unspecified chest pain type    Jeffrey [name] gets great relief and reassurance from normal troponin and unchanged EKG. Blood pressures here in much  improved versus what he describes home expected large degree of anxiety on his part and he admits this freely. Vascular to bring his blood pressure cuff to his next physician appointment to ensure that his readings at home are accurate.    Jeffrey Furry, MD 07/10/15 [phone]

## 2015-07-10 NOTE — ED Notes (Signed)
MD at bedside. 

## 2015-07-10 NOTE — Discharge Instructions (Signed)
Follow up with your primary care doctor.

## 2015-07-13 ENCOUNTER — Encounter (HOSPITAL_COMMUNITY): Payer: Self-pay | Admitting: Emergency Medicine

## 2015-07-13 ENCOUNTER — Emergency Department (HOSPITAL_COMMUNITY): Payer: Medicare Other

## 2015-07-13 ENCOUNTER — Emergency Department (HOSPITAL_COMMUNITY)
Admission: EM | Admit: 2015-07-13 | Discharge: 2015-07-13 | Disposition: A | Discharge status: Home / Self Care | Payer: Medicare Other | Attending: Emergency Medicine | Admitting: Emergency Medicine

## 2015-07-13 DIAGNOSIS — Z87891 Personal history of nicotine dependence: Secondary | ICD-10-CM | POA: Diagnosis not present

## 2015-07-13 DIAGNOSIS — Z7984 Long term (current) use of oral hypoglycemic drugs: Secondary | ICD-10-CM | POA: Diagnosis not present

## 2015-07-13 DIAGNOSIS — Z8711 Personal history of peptic ulcer disease: Secondary | ICD-10-CM | POA: Insufficient documentation

## 2015-07-13 DIAGNOSIS — I10 Essential (primary) hypertension: Secondary | ICD-10-CM | POA: Diagnosis not present

## 2015-07-13 DIAGNOSIS — E785 Hyperlipidemia, unspecified: Secondary | ICD-10-CM | POA: Diagnosis not present

## 2015-07-13 DIAGNOSIS — I255 Ischemic cardiomyopathy: Secondary | ICD-10-CM | POA: Diagnosis not present

## 2015-07-13 DIAGNOSIS — Z8673 Personal history of transient ischemic attack (TIA), and cerebral infarction without residual deficits: Secondary | ICD-10-CM | POA: Insufficient documentation

## 2015-07-13 DIAGNOSIS — G8929 Other chronic pain: Secondary | ICD-10-CM | POA: Diagnosis not present

## 2015-07-13 DIAGNOSIS — F418 Other specified anxiety disorders: Secondary | ICD-10-CM | POA: Diagnosis not present

## 2015-07-13 DIAGNOSIS — Z8701 Personal history of pneumonia (recurrent): Secondary | ICD-10-CM | POA: Insufficient documentation

## 2015-07-13 DIAGNOSIS — Z79899 Other long term (current) drug therapy: Secondary | ICD-10-CM | POA: Insufficient documentation

## 2015-07-13 DIAGNOSIS — M1990 Unspecified osteoarthritis, unspecified site: Secondary | ICD-10-CM | POA: Diagnosis not present

## 2015-07-13 DIAGNOSIS — Z7982 Long term (current) use of aspirin: Secondary | ICD-10-CM | POA: Insufficient documentation

## 2015-07-13 DIAGNOSIS — E039 Hypothyroidism, unspecified: Secondary | ICD-10-CM | POA: Diagnosis not present

## 2015-07-13 DIAGNOSIS — K219 Gastro-esophageal reflux disease without esophagitis: Secondary | ICD-10-CM | POA: Diagnosis not present

## 2015-07-13 DIAGNOSIS — R079 Chest pain, unspecified: Secondary | ICD-10-CM | POA: Insufficient documentation

## 2015-07-13 DIAGNOSIS — N400 Enlarged prostate without lower urinary tract symptoms: Secondary | ICD-10-CM | POA: Insufficient documentation

## 2015-07-13 DIAGNOSIS — Z7902 Long term (current) use of antithrombotics/antiplatelets: Secondary | ICD-10-CM | POA: Diagnosis not present

## 2015-07-13 DIAGNOSIS — Z9889 Other specified postprocedural states: Secondary | ICD-10-CM | POA: Insufficient documentation

## 2015-07-13 LAB — CBC
HCT: 37.9 % — ABNORMAL LOW (ref 39.0–52.0)
Hemoglobin: 12.8 g/dL — ABNORMAL LOW (ref 13.0–17.0)
MCH: 31.9 pg (ref 26.0–34.0)
MCHC: 33.8 g/dL (ref 30.0–36.0)
MCV: 94.5 fL (ref 78.0–100.0)
PLATELETS: 139 10*3/uL — AB (ref 150–400)
RBC: 4.01 MIL/uL — ABNORMAL LOW (ref 4.22–5.81)
RDW: 12.4 % (ref 11.5–15.5)
WBC: 6.7 10*3/uL (ref 4.0–10.5)

## 2015-07-13 LAB — BASIC METABOLIC PANEL
Anion gap: 7 (ref 5–15)
BUN: 17 mg/dL (ref 6–20)
CHLORIDE: 105 mmol/L (ref 101–111)
CO2: 25 mmol/L (ref 22–32)
CREATININE: 0.81 mg/dL (ref 0.61–1.24)
Calcium: 8.9 mg/dL (ref 8.9–10.3)
GFR calc Af Amer: >60 mL/min (ref >60)
GFR calc non Af Amer: >60 mL/min (ref >60)
GLUCOSE: 193 mg/dL — AB (ref 65–99)
Potassium: 4.1 mmol/L (ref 3.5–5.1)
Sodium: 137 mmol/L (ref 135–145)

## 2015-07-13 LAB — TROPONIN I: Troponin I: <0.03 ng/mL (ref <0.031)

## 2015-07-13 NOTE — ED Notes (Signed)
Pt reports chest pain this am when picked up trash bag. Pt reports pain subsided by the time pt arrived at ER. Pt reports intermittent sob. nad noted. Pt denies any pain at this time.

## 2015-07-13 NOTE — ED Provider Notes (Signed)
CSN: [id number]     Arrival date & time 07/13/15  0720 History   First MD Initiated Contact with Patient 07/13/15 [phone]     Chief Complaint  Patient presents with  . Chest Pain     (Consider location/radiation/quality/duration/timing/severity/associated sxs/prior Treatment) HPI..... Chest pain this morning while bending over to pick up the trash described as" hurts like hell".  No dyspnea, diaphoresis, nausea. Symptoms have since abated. He now is symptom free. Long history of cardiac disease including multiple cardiac catheterizations. Patient has multiple emergency visits.  Past Medical History  Diagnosis Date  . Diabetes mellitus, type II ([place])   . Hyperlipidemia   . Coronary atherosclerosis of native coronary artery     a. CABG x 4 in 1989 (VG->OM1->OM2, VG->RCA, LIMA->LAD), b. 05/2010: DES to VG-OM1/OM2, DES to distal LCx. c. NSTEMI in 04/2011 - TO distal LCX stent and VG->OM2. d. 02/2012 NSTEMI DES to VG-OM1/continuation to OM2 occluded. e. inferior STEMI s/p DES to SVG-RAMUS 06/2012. f. inferolat STEMI 09/2012 s/p DES to SVG-interm; g. Lex MV (11/14):  EF 35%, inf-lat scar with small peri-infarct ischemia  . Essential hypertension, benign   . Osteoarthritis   . History of stroke   . History of pneumonia   . Cervical vertebral fracture ([place])   . Chronic back pain   . Benign prostatic hypertrophy     History of urinary retention  . Peptic ulcer disease   . Gastroesophageal reflux disease   . Ischemic cardiomyopathy Nov 2015    EF 35% cath, 45-50% by echo  . Hypothyroidism   . Anxiety about health     "multiple somatic complaints"  . Chronic chest pain    Past Surgical History  Procedure Laterality Date  . Tonsillectomy    . Coronary artery bypass graft  1989  . Coronary angioplasty  10/12, 8/13, 12/13, 3/14    SVG-OM PCI  . Cardiac catheterization  05/19/14    SVG-OM occl- medical Rx  . Left heart catheterization with coronary angiogram N/A 11/01/2011    Procedure: LEFT  HEART CATHETERIZATION WITH CORONARY ANGIOGRAM;  Surgeon: Lorretta Harp, MD;  Location: [facility] CATH LAB;  Service: Cardiovascular;  Laterality: N/A;  . Percutaneous coronary stent intervention (pci-s) N/A 11/01/2011    Procedure: PERCUTANEOUS CORONARY STENT INTERVENTION (PCI-S);  Surgeon: Lorretta Harp, MD;  Location: [facility] CATH LAB;  Service: Cardiovascular;  Laterality: N/A;  . Left heart catheterization with coronary/graft angiogram N/A 03/10/2012    Procedure: LEFT HEART CATHETERIZATION WITH [name];  Surgeon: Sherren Mocha, MD;  Location: [facility] CATH LAB;  Service: Cardiovascular;  Laterality: N/A;  . Left heart catheterization with coronary angiogram N/A 06/18/2012    Procedure: LEFT HEART CATHETERIZATION WITH CORONARY ANGIOGRAM;  Surgeon: Peter M Martinique, MD;  Location: [facility] CATH LAB;  Service: Cardiovascular;  Laterality: N/A;  . Percutaneous coronary stent intervention (pci-s)  06/18/2012    Procedure: PERCUTANEOUS CORONARY STENT INTERVENTION (PCI-S);  Surgeon: Peter M Martinique, MD;  Location: [facility] CATH LAB;  Service: Cardiovascular;;  . Left heart catheterization with coronary/graft angiogram  10/06/2012    Procedure: LEFT HEART CATHETERIZATION WITH [name];  Surgeon: Burnell Blanks, MD;  Location: [facility] CATH LAB;  Service: Cardiovascular;;  . Percutaneous coronary stent intervention (pci-s)  10/06/2012    Procedure: PERCUTANEOUS CORONARY STENT INTERVENTION (PCI-S);  Surgeon: Burnell Blanks, MD;  Location: [facility] CATH LAB;  Service: Cardiovascular;;  . Left heart catheterization with coronary/graft angiogram N/A 11/09/2012    Procedure: LEFT HEART CATHETERIZATION WITH CORONARY/GRAFT ANGIOGRAM;  Surgeon: Peter M Martinique, MD;  Location: [facility] CATH LAB;  Service: Cardiovascular;  Laterality: N/A;  . Left heart catheterization with coronary/graft angiogram N/A 05/19/2014    Procedure: LEFT HEART CATHETERIZATION WITH [name];  Surgeon: Troy Sine, MD;   Location: [facility] CATH LAB;  Service: Cardiovascular;  Laterality: N/A;   Family History  Problem Relation Age of Onset  . Early death      Parents died young  . Appendicitis Mother     Pt was 25 year old  . Heart attack Father 65   Social History  Substance Use Topics  . Smoking status: Former Smoker -- 2.00 packs/day for 10 years    Types: Cigarettes    Start date: 07/14/1950    Quit date: 07/14/1961  . Smokeless tobacco: Current User    Types: Chew  . Alcohol Use: No    Review of Systems  All other systems reviewed and are negative.     Allergies  Review of patient's allergies indicates no known allergies.  Home Medications   Prior to Admission medications   Medication Sig Start Date End Date Taking? Authorizing Provider  acetaminophen (TYLENOL) 325 MG tablet Take 2 tablets (650 mg total) by mouth every 4 (four) hours as needed for headache or mild pain. Patient taking differently: Take 325 mg by mouth 2 (two) times daily as needed for headache or mild pain.  05/21/14  Yes Luke K Kilroy, PA-C  albuterol (PROVENTIL HFA;VENTOLIN HFA) 108 (90 BASE) MCG/ACT inhaler Inhale 2 puffs into the lungs every 6 (six) hours as needed for wheezing or shortness of breath.   Yes Historical Provider, MD  aspirin 325 MG tablet Take 325 mg by mouth daily as needed for moderate pain.   Yes Historical Provider, MD  aspirin EC 81 MG tablet Take 81 mg by mouth daily. Reported on 06/29/2015   Yes Historical Provider, MD  atorvastatin (LIPITOR) 40 MG tablet Take 1 tablet (40 mg total) by mouth daily at 6 PM. 05/29/13  Yes Scott T Kathlen Mody, PA-C  bismuth subsalicylate (PEPTO BISMOL) 262 MG/15ML suspension Take 30 mLs by mouth every 6 (six) hours as needed for indigestion or diarrhea or loose stools.    Yes Historical Provider, MD  BRILINTA 90 MG TABS tablet TAKE 1 TABLET BY MOUTH TWICE DAILY. 05/15/15  Yes Lendon Colonel, NP  carvedilol (COREG) 3.125 MG tablet TAKE 1 TABLET BY MOUTH TWICE DAILY WITH  MEALS. (HEART RATE & BLOOD PRESSURE) 03/05/15  Yes Lendon Colonel, NP  famotidine (PEPCID) 20 MG tablet Take 1 tablet (20 mg total) by mouth at bedtime. 03/03/15  Yes Rexene Alberts, MD  furosemide (LASIX) 20 MG tablet Take 20 mg by mouth as needed for fluid. Reported on 06/29/2015 05/06/13  Yes Lendon Colonel, NP  isosorbide mononitrate (IMDUR) 30 MG 24 hr tablet Take 1.5 tablets (45 mg total) by mouth daily. 03/03/15  Yes Rexene Alberts, MD  levothyroxine (SYNTHROID, LEVOTHROID) 50 MCG tablet Take 50 mcg by mouth daily before breakfast.  09/08/14  Yes Historical Provider, MD  loperamide (IMODIUM) 2 MG capsule Take 1 capsule (2 mg total) by mouth 4 (four) times daily as needed for diarrhea or loose stools. 04/01/15  Yes Kristen N Ward, DO  metFORMIN (GLUCOPHAGE) 500 MG tablet Take 2 tablets (1,000 mg total) by mouth 2 (two) times daily with a meal. RESTART ON Sunday, 03/04/15. Patient taking differently: Take 1,000 mg by mouth 2 (two) times daily with a meal.  03/03/15  Yes  Rexene Alberts, MD  Multiple Vitamin (MULTIVITAMIN WITH MINERALS) TABS Take 1 tablet by mouth daily.   Yes Historical Provider, MD  NITROSTAT 0.4 MG SL tablet PLACE 1 TAB UNDER TONGUE EVERY 5 MIN IF NEEDED FOR CHEST PAIN. MAY USE 3 TIMES.NO RELIEF CALL 911. 07/04/15  Yes Lendon Colonel, NP  potassium chloride (K-DUR) 10 MEQ tablet Take 10 mEq by mouth as needed. Takes along with the Furosemide when he has swelling.   Yes Historical Provider, MD  tamsulosin (FLOMAX) 0.4 MG CAPS Take 0.4 mg by mouth daily after supper.  01/27/13  Yes Historical Provider, MD   BP 121/58 mmHg  Pulse 74  Temp(Src) 97.6 F (36.4 C) (Oral)  Resp 19  Ht 5\' 10"  (1.778 m)  Wt 170 lb (77.111 kg)  BMI 24.39 kg/m2  SpO2 99% Physical Exam  Constitutional: He is oriented to person, place, and time. He appears well-developed and well-nourished.  HENT:  Head: Normocephalic and atraumatic.  Eyes: Conjunctivae and EOM are normal. Pupils are equal, round,  and reactive to light.  Neck: Normal range of motion. Neck supple.  Cardiovascular: Normal rate and regular rhythm.   Pulmonary/Chest: Effort normal and breath sounds normal.  Abdominal: Soft. Bowel sounds are normal.  Musculoskeletal: Normal range of motion.  Neurological: He is alert and oriented to person, place, and time.  Skin:  0.75 cm ulceration on left lower lip  Psychiatric: He has a normal mood and affect. His behavior is normal.  Nursing note and vitals reviewed.   ED Course  Procedures (including critical care time) Labs Review Labs Reviewed  BASIC METABOLIC PANEL - Abnormal; Notable for the following:    Glucose, Bld 193 (*)    All other components within normal limits  CBC - Abnormal; Notable for the following:    RBC 4.01 (*)    Hemoglobin 12.8 (*)    HCT 37.9 (*)    Platelets 139 (*)    All other components within normal limits  TROPONIN I    Imaging Review Dg Chest 2 View  07/13/2015  CLINICAL DATA:  Chest pain, intermittent shortness of breath. History of diabetes, hypertension, stroke, pneumonia, cardiomyopathy. EXAM: CHEST  2 VIEW COMPARISON:  Previous exams. FINDINGS: Heart size is normal. Overall cardiomediastinal silhouette is stable in size and configuration. Again noted are surgical changes of median sternotomy for presumed CABG. Lungs are clear. Lung volumes are normal. No pleural effusion seen. No pneumothorax. Mild degenerative change again noted within the thoracic spine. No acute osseous abnormality. IMPRESSION: No active cardiopulmonary disease. Electronically Signed   By: Franki Cabot M.D.   On: 07/13/2015 08:17   I have personally reviewed and evaluated these images and lab results as part of my medical decision-making.   EKG Interpretation   Date/Time:  Friday July 13 2015 07:28:06 EST Ventricular Rate:  75 PR Interval:  199 QRS Duration: 101 QT Interval:  395 QTC Calculation: 441 R Axis:   89 Text Interpretation:  Sinus rhythm  Borderline right axis deviation Repol  abnrm suggests ischemia, diffuse leads Confirmed by Shankar Silber  MD, Kendalynn Wideman  ([id number]) on 07/13/2015 8:38:22 AM      MDM   Final diagnoses:  Chest pain, unspecified chest pain type    Patient is well-known to the emergency department with chest pain. He appears to be his normal self. He is pleasant. Good color. No evidence of acute changes on his EKG or troponin. He is symptom-free at this time.    [name], MD  07/13/15 1115 

## 2015-07-13 NOTE — Discharge Instructions (Signed)
Tests were normal. Follow-up your primary care doctor. Return if worse.

## 2015-07-18 ENCOUNTER — Emergency Department (HOSPITAL_COMMUNITY)
Admission: EM | Admit: 2015-07-18 | Discharge: 2015-07-18 | Disposition: A | Discharge status: Home / Self Care | Payer: Medicare Other | Attending: Emergency Medicine | Admitting: Emergency Medicine

## 2015-07-18 ENCOUNTER — Emergency Department (HOSPITAL_COMMUNITY): Payer: Medicare Other

## 2015-07-18 ENCOUNTER — Encounter (HOSPITAL_COMMUNITY): Payer: Self-pay | Admitting: *Deleted

## 2015-07-18 DIAGNOSIS — Z7984 Long term (current) use of oral hypoglycemic drugs: Secondary | ICD-10-CM | POA: Diagnosis not present

## 2015-07-18 DIAGNOSIS — E039 Hypothyroidism, unspecified: Secondary | ICD-10-CM | POA: Insufficient documentation

## 2015-07-18 DIAGNOSIS — Z8659 Personal history of other mental and behavioral disorders: Secondary | ICD-10-CM | POA: Insufficient documentation

## 2015-07-18 DIAGNOSIS — K219 Gastro-esophageal reflux disease without esophagitis: Secondary | ICD-10-CM | POA: Diagnosis not present

## 2015-07-18 DIAGNOSIS — R479 Unspecified speech disturbances: Secondary | ICD-10-CM | POA: Insufficient documentation

## 2015-07-18 DIAGNOSIS — G8929 Other chronic pain: Secondary | ICD-10-CM | POA: Insufficient documentation

## 2015-07-18 DIAGNOSIS — Z7982 Long term (current) use of aspirin: Secondary | ICD-10-CM | POA: Insufficient documentation

## 2015-07-18 DIAGNOSIS — R079 Chest pain, unspecified: Secondary | ICD-10-CM | POA: Diagnosis not present

## 2015-07-18 DIAGNOSIS — Z79899 Other long term (current) drug therapy: Secondary | ICD-10-CM | POA: Insufficient documentation

## 2015-07-18 DIAGNOSIS — M1990 Unspecified osteoarthritis, unspecified site: Secondary | ICD-10-CM | POA: Diagnosis not present

## 2015-07-18 DIAGNOSIS — K130 Diseases of lips: Secondary | ICD-10-CM | POA: Insufficient documentation

## 2015-07-18 DIAGNOSIS — R0602 Shortness of breath: Secondary | ICD-10-CM | POA: Diagnosis not present

## 2015-07-18 DIAGNOSIS — I10 Essential (primary) hypertension: Secondary | ICD-10-CM | POA: Diagnosis not present

## 2015-07-18 DIAGNOSIS — I2510 Atherosclerotic heart disease of native coronary artery without angina pectoris: Secondary | ICD-10-CM | POA: Insufficient documentation

## 2015-07-18 DIAGNOSIS — Z8673 Personal history of transient ischemic attack (TIA), and cerebral infarction without residual deficits: Secondary | ICD-10-CM | POA: Diagnosis not present

## 2015-07-18 DIAGNOSIS — E119 Type 2 diabetes mellitus without complications: Secondary | ICD-10-CM | POA: Diagnosis not present

## 2015-07-18 DIAGNOSIS — Z9889 Other specified postprocedural states: Secondary | ICD-10-CM | POA: Diagnosis not present

## 2015-07-18 DIAGNOSIS — Z8701 Personal history of pneumonia (recurrent): Secondary | ICD-10-CM | POA: Diagnosis not present

## 2015-07-18 DIAGNOSIS — Z951 Presence of aortocoronary bypass graft: Secondary | ICD-10-CM | POA: Insufficient documentation

## 2015-07-18 DIAGNOSIS — Z8711 Personal history of peptic ulcer disease: Secondary | ICD-10-CM | POA: Diagnosis not present

## 2015-07-18 DIAGNOSIS — Z87891 Personal history of nicotine dependence: Secondary | ICD-10-CM | POA: Insufficient documentation

## 2015-07-18 DIAGNOSIS — E785 Hyperlipidemia, unspecified: Secondary | ICD-10-CM | POA: Insufficient documentation

## 2015-07-18 DIAGNOSIS — N400 Enlarged prostate without lower urinary tract symptoms: Secondary | ICD-10-CM | POA: Diagnosis not present

## 2015-07-18 DIAGNOSIS — Z9861 Coronary angioplasty status: Secondary | ICD-10-CM | POA: Insufficient documentation

## 2015-07-18 DIAGNOSIS — Z8781 Personal history of (healed) traumatic fracture: Secondary | ICD-10-CM | POA: Insufficient documentation

## 2015-07-18 LAB — BASIC METABOLIC PANEL
Anion gap: 7 (ref 5–15)
BUN: 16 mg/dL (ref 6–20)
CHLORIDE: 105 mmol/L (ref 101–111)
CO2: 27 mmol/L (ref 22–32)
CREATININE: 0.82 mg/dL (ref 0.61–1.24)
Calcium: 9.3 mg/dL (ref 8.9–10.3)
GFR calc Af Amer: >60 mL/min (ref >60)
GFR calc non Af Amer: >60 mL/min (ref >60)
Glucose, Bld: 181 mg/dL — ABNORMAL HIGH (ref 65–99)
Potassium: 4.2 mmol/L (ref 3.5–5.1)
Sodium: 139 mmol/L (ref 135–145)

## 2015-07-18 LAB — CBC
HCT: 39 % (ref 39.0–52.0)
Hemoglobin: 13.1 g/dL (ref 13.0–17.0)
MCH: 31.6 pg (ref 26.0–34.0)
MCHC: 33.6 g/dL (ref 30.0–36.0)
MCV: 94.2 fL (ref 78.0–100.0)
PLATELETS: 143 10*3/uL — AB (ref 150–400)
RBC: 4.14 MIL/uL — ABNORMAL LOW (ref 4.22–5.81)
RDW: 12.4 % (ref 11.5–15.5)
WBC: 7.4 10*3/uL (ref 4.0–10.5)

## 2015-07-18 LAB — TROPONIN I
Troponin I: <0.03 ng/mL (ref <0.031)
Troponin I: <0.03 ng/mL (ref <0.031)

## 2015-07-18 NOTE — Discharge Instructions (Signed)
Nonspecific Chest Pain  Follow up with your cardiologist tomorrow. Return to the ED if you develop new or worsening symptoms. Chest pain can be caused by many different conditions. There is always a chance that your pain could be related to something serious, such as a heart attack or a blood clot in your lungs. Chest pain can also be caused by conditions that are not life-threatening. If you have chest pain, it is very important to follow up with your health care provider. CAUSES  Chest pain can be caused by:  Heartburn.  Pneumonia or bronchitis.  Anxiety or stress.  Inflammation around your heart (pericarditis) or lung (pleuritis or pleurisy).  A blood clot in your lung.  A collapsed lung (pneumothorax). It can develop suddenly on its own (spontaneous pneumothorax) or from trauma to the chest.  Shingles infection (varicella-zoster virus).  Heart attack.  Damage to the bones, muscles, and cartilage that make up your chest wall. This can include:  Bruised bones due to injury.  Strained muscles or cartilage due to frequent or repeated coughing or overwork.  Fracture to one or more ribs.  Sore cartilage due to inflammation (costochondritis). RISK FACTORS  Risk factors for chest pain may include:  Activities that increase your risk for trauma or injury to your chest.  Respiratory infections or conditions that cause frequent coughing.  Medical conditions or overeating that can cause heartburn.  Heart disease or family history of heart disease.  Conditions or health behaviors that increase your risk of developing a blood clot.  Having had chicken pox (varicella zoster). SIGNS AND SYMPTOMS Chest pain can feel like:  Burning or tingling on the surface of your chest or deep in your chest.  Crushing, pressure, aching, or squeezing pain.  Dull or sharp pain that is worse when you move, cough, or take a deep breath.  Pain that is also felt in your back, neck, shoulder, or  arm, or pain that spreads to any of these areas. Your chest pain may come and go, or it may stay constant. DIAGNOSIS Lab tests or other studies may be needed to find the cause of your pain. Your health care provider may have you take a test called an ambulatory ECG (electrocardiogram). An ECG records your heartbeat patterns at the time the test is performed. You may also have other tests, such as:  Transthoracic echocardiogram (TTE). During echocardiography, sound waves are used to create a picture of all of the heart structures and to look at how blood flows through your heart.  Transesophageal echocardiogram (TEE).This is a more advanced imaging test that obtains images from inside your body. It allows your health care provider to see your heart in finer detail.  Cardiac monitoring. This allows your health care provider to monitor your heart rate and rhythm in real time.  Holter monitor. This is a portable device that records your heartbeat and can help to diagnose abnormal heartbeats. It allows your health care provider to track your heart activity for several days, if needed.  Stress tests. These can be done through exercise or by taking medicine that makes your heart beat more quickly.  Blood tests.  Imaging tests. TREATMENT  Your treatment depends on what is causing your chest pain. Treatment may include:  Medicines. These may include:  Acid blockers for heartburn.  Anti-inflammatory medicine.  Pain medicine for inflammatory conditions.  Antibiotic medicine, if an infection is present.  Medicines to dissolve blood clots.  Medicines to treat coronary artery disease.  Supportive care for conditions that do not require medicines. This may include:  Resting.  Applying heat or cold packs to injured areas.  Limiting activities until pain decreases. HOME CARE INSTRUCTIONS  If you were prescribed an antibiotic medicine, finish it all even if you start to feel  better.  Avoid any activities that bring on chest pain.  Do not use any tobacco products, including cigarettes, chewing tobacco, or electronic cigarettes. If you need help quitting, ask your health care provider.  Do not drink alcohol.  Take medicines only as directed by your health care provider.  Keep all follow-up visits as directed by your health care provider. This is important. This includes any further testing if your chest pain does not go away.  If heartburn is the cause for your chest pain, you may be told to keep your head raised (elevated) while sleeping. This reduces the chance that acid will go from your stomach into your esophagus.  Make lifestyle changes as directed by your health care provider. These may include:  Getting regular exercise. Ask your health care provider to suggest some activities that are safe for you.  Eating a heart-healthy diet. A registered dietitian can help you to learn healthy eating options.  Maintaining a healthy weight.  Managing diabetes, if necessary.  Reducing stress. SEEK MEDICAL CARE IF:  Your chest pain does not go away after treatment.  You have a rash with blisters on your chest.  You have a fever. SEEK IMMEDIATE MEDICAL CARE IF:   Your chest pain is worse.  You have an increasing cough, or you cough up blood.  You have severe abdominal pain.  You have severe weakness.  You faint.  You have chills.  You have sudden, unexplained chest discomfort.  You have sudden, unexplained discomfort in your arms, back, neck, or jaw.  You have shortness of breath at any time.  You suddenly start to sweat, or your skin gets clammy.  You feel nauseous or you vomit.  You suddenly feel light-headed or dizzy.  Your heart begins to beat quickly, or it feels like it is skipping beats. These symptoms may represent a serious problem that is an emergency. Do not wait to see if the symptoms will go away. Get medical help right away.  Call your local emergency services (911 in the [place] Do not drive yourself to the hospital.   This information is not intended to replace advice given to you by your health care provider. Make sure you discuss any questions you have with your health care provider.   Document Released: 04/09/2005 Document Revised: 07/21/2014 Document Reviewed: 02/03/2014 Elsevier Interactive Patient Education [employer].

## 2015-07-18 NOTE — ED Notes (Signed)
MD at bedside.  Dr Wyvonnia Dusky says hold IV for now.

## 2015-07-18 NOTE — ED Notes (Signed)
Pt comes in with chest pain starting around 1300. Pt states he is feeling shortness of breath. Pt is having no difficulties in breathing in triage. Pt alert and oriented.

## 2015-07-18 NOTE — ED Provider Notes (Signed)
CSN: [id number]     Arrival date & time 07/18/15  1454 History   First MD Initiated Contact with Patient 07/18/15 1652     Chief Complaint  Patient presents with  . Chest Pain     (Consider location/radiation/quality/duration/timing/severity/associated sxs/prior Treatment) HPI Comments: Patient complains of pain to the left side of his chest that onset around 1:30 PM shortly after lifting a bag of trash. He describes it as a "cramp". The pain is since resolved. He took an aspirin and the pain went away says it lasts about 30 minutes. Patient with frequent visits to the ED with chest pain and is a poor historian. He has extensive CAD status post bypass and multiple stents. Last catheterization November 2015 was reassuring. He states he feels back to normal now. There is no radiation of the pain. There is no shortness of breath, nausea, vomiting, fever or cough. No leg pain or leg swelling.  82 y.o. male, with PMHx noted below GERD, cardiac catheterization, high BP, hyperlipidemia, CABG x4, stroke, DMTII, 10 coronary stents and ischemic cardiomyopathy,  The history is provided by the patient.    Past Medical History  Diagnosis Date  . Diabetes mellitus, type II ([place])   . Hyperlipidemia   . Coronary atherosclerosis of native coronary artery     a. CABG x 4 in 1989 (VG->OM1->OM2, VG->RCA, LIMA->LAD), b. 05/2010: DES to VG-OM1/OM2, DES to distal LCx. c. NSTEMI in 04/2011 - TO distal LCX stent and VG->OM2. d. 02/2012 NSTEMI DES to VG-OM1/continuation to OM2 occluded. e. inferior STEMI s/p DES to SVG-RAMUS 06/2012. f. inferolat STEMI 09/2012 s/p DES to SVG-interm; g. Lex MV (11/14):  EF 35%, inf-lat scar with small peri-infarct ischemia  . Essential hypertension, benign   . Osteoarthritis   . History of stroke   . History of pneumonia   . Cervical vertebral fracture ([place])   . Chronic back pain   . Benign prostatic hypertrophy     History of urinary retention  . Peptic ulcer disease   .  Gastroesophageal reflux disease   . Ischemic cardiomyopathy Nov 2015    EF 35% cath, 45-50% by echo  . Hypothyroidism   . Anxiety about health     "multiple somatic complaints"  . Chronic chest pain    Past Surgical History  Procedure Laterality Date  . Tonsillectomy    . Coronary artery bypass graft  1989  . Coronary angioplasty  10/12, 8/13, 12/13, 3/14    SVG-OM PCI  . Cardiac catheterization  05/19/14    SVG-OM occl- medical Rx  . Left heart catheterization with coronary angiogram N/A 11/01/2011    Procedure: LEFT HEART CATHETERIZATION WITH CORONARY ANGIOGRAM;  Surgeon: Lorretta Harp, MD;  Location: [facility] CATH LAB;  Service: Cardiovascular;  Laterality: N/A;  . Percutaneous coronary stent intervention (pci-s) N/A 11/01/2011    Procedure: PERCUTANEOUS CORONARY STENT INTERVENTION (PCI-S);  Surgeon: Lorretta Harp, MD;  Location: [facility] CATH LAB;  Service: Cardiovascular;  Laterality: N/A;  . Left heart catheterization with coronary/graft angiogram N/A 03/10/2012    Procedure: LEFT HEART CATHETERIZATION WITH [name];  Surgeon: Sherren Mocha, MD;  Location: [facility] CATH LAB;  Service: Cardiovascular;  Laterality: N/A;  . Left heart catheterization with coronary angiogram N/A 06/18/2012    Procedure: LEFT HEART CATHETERIZATION WITH CORONARY ANGIOGRAM;  Surgeon: Peter M Martinique, MD;  Location: [facility] CATH LAB;  Service: Cardiovascular;  Laterality: N/A;  . Percutaneous coronary stent intervention (pci-s)  06/18/2012    Procedure: PERCUTANEOUS CORONARY STENT  INTERVENTION (PCI-S);  Surgeon: Peter M Martinique, MD;  Location: [facility] CATH LAB;  Service: Cardiovascular;;  . Left heart catheterization with coronary/graft angiogram  10/06/2012    Procedure: LEFT HEART CATHETERIZATION WITH [name];  Surgeon: Burnell Blanks, MD;  Location: [facility] CATH LAB;  Service: Cardiovascular;;  . Percutaneous coronary stent intervention (pci-s)  10/06/2012    Procedure: PERCUTANEOUS CORONARY STENT  INTERVENTION (PCI-S);  Surgeon: Burnell Blanks, MD;  Location: [facility] CATH LAB;  Service: Cardiovascular;;  . Left heart catheterization with coronary/graft angiogram N/A 11/09/2012    Procedure: LEFT HEART CATHETERIZATION WITH [name];  Surgeon: Peter M Martinique, MD;  Location: [facility] CATH LAB;  Service: Cardiovascular;  Laterality: N/A;  . Left heart catheterization with coronary/graft angiogram N/A 05/19/2014    Procedure: LEFT HEART CATHETERIZATION WITH [name];  Surgeon: Troy Sine, MD;  Location: [facility] CATH LAB;  Service: Cardiovascular;  Laterality: N/A;   Family History  Problem Relation Age of Onset  . Early death      Parents died young  . Appendicitis Mother     Pt was 54 year old  . Heart attack Father 73   Social History  Substance Use Topics  . Smoking status: Former Smoker -- 2.00 packs/day for 10 years    Types: Cigarettes    Start date: 07/14/1950    Quit date: 07/14/1961  . Smokeless tobacco: Current User    Types: Chew  . Alcohol Use: No    Review of Systems  Constitutional: Negative for fever, activity change and appetite change.  HENT: Negative for congestion and rhinorrhea.   Eyes: Negative for visual disturbance.  Respiratory: Positive for chest tightness and shortness of breath.   Cardiovascular: Positive for chest pain.  Gastrointestinal: Negative for nausea, vomiting and abdominal pain.  Genitourinary: Negative for dysuria and hematuria.  Musculoskeletal: Negative for myalgias and arthralgias.  Skin: Negative for wound.  Neurological: Negative for dizziness, weakness, light-headedness and headaches.  A complete 10 system review of systems was obtained and all systems are negative except as noted in the HPI and PMH.      Allergies  Review of patient's allergies indicates no known allergies.  Home Medications   Prior to Admission medications   Medication Sig Start Date End Date Taking? Authorizing Provider   acetaminophen (TYLENOL) 325 MG tablet Take 2 tablets (650 mg total) by mouth every 4 (four) hours as needed for headache or mild pain. Patient taking differently: Take 325 mg by mouth 2 (two) times daily as needed for headache or mild pain.  05/21/14  Yes Luke K Kilroy, PA-C  albuterol (PROVENTIL HFA;VENTOLIN HFA) 108 (90 BASE) MCG/ACT inhaler Inhale 2 puffs into the lungs every 6 (six) hours as needed for wheezing or shortness of breath.   Yes Historical Provider, MD  aspirin 325 MG tablet Take 325 mg by mouth daily as needed for moderate pain.   Yes Historical Provider, MD  aspirin EC 81 MG tablet Take 81 mg by mouth daily. Reported on 06/29/2015   Yes Historical Provider, MD  atorvastatin (LIPITOR) 40 MG tablet Take 1 tablet (40 mg total) by mouth daily at 6 PM. 05/29/13  Yes Scott T Kathlen Mody, PA-C  bismuth subsalicylate (PEPTO BISMOL) 262 MG/15ML suspension Take 30 mLs by mouth every 6 (six) hours as needed for indigestion or diarrhea or loose stools.    Yes Historical Provider, MD  BRILINTA 90 MG TABS tablet TAKE 1 TABLET BY MOUTH TWICE DAILY. 05/15/15  Yes Lendon Colonel,  NP  carvedilol (COREG) 3.125 MG tablet TAKE 1 TABLET BY MOUTH TWICE DAILY WITH MEALS. (HEART RATE & BLOOD PRESSURE) 03/05/15  Yes Lendon Colonel, NP  famotidine (PEPCID) 20 MG tablet Take 1 tablet (20 mg total) by mouth at bedtime. 03/03/15  Yes Rexene Alberts, MD  furosemide (LASIX) 20 MG tablet Take 20 mg by mouth as needed for fluid. Reported on 06/29/2015 05/06/13  Yes Lendon Colonel, NP  isosorbide mononitrate (IMDUR) 30 MG 24 hr tablet Take 1.5 tablets (45 mg total) by mouth daily. 03/03/15  Yes Rexene Alberts, MD  levothyroxine (SYNTHROID, LEVOTHROID) 50 MCG tablet Take 50 mcg by mouth daily before breakfast.  09/08/14  Yes Historical Provider, MD  loperamide (IMODIUM) 2 MG capsule Take 1 capsule (2 mg total) by mouth 4 (four) times daily as needed for diarrhea or loose stools. 04/01/15  Yes Kristen N Ward, DO   metFORMIN (GLUCOPHAGE) 500 MG tablet Take 2 tablets (1,000 mg total) by mouth 2 (two) times daily with a meal. RESTART ON Sunday, 03/04/15. Patient taking differently: Take 1,000 mg by mouth 2 (two) times daily with a meal.  03/03/15  Yes Rexene Alberts, MD  Multiple Vitamin (MULTIVITAMIN WITH MINERALS) TABS Take 1 tablet by mouth daily.   Yes Historical Provider, MD  NITROSTAT 0.4 MG SL tablet PLACE 1 TAB UNDER TONGUE EVERY 5 MIN IF NEEDED FOR CHEST PAIN. MAY USE 3 TIMES.NO RELIEF CALL 911. 07/04/15  Yes Lendon Colonel, NP  potassium chloride (K-DUR) 10 MEQ tablet Take 10 mEq by mouth as needed. Takes along with the Furosemide when he has swelling.   Yes Historical Provider, MD  tamsulosin (FLOMAX) 0.4 MG CAPS Take 0.4 mg by mouth daily after supper.  01/27/13  Yes Historical Provider, MD   BP 154/85 mmHg  Pulse 85  Temp(Src) 97.9 F (36.6 C) (Oral)  Resp 20  Ht 5\' 10"  (1.778 m)  Wt 170 lb (77.111 kg)  BMI 24.39 kg/m2  SpO2 100% Physical Exam  Constitutional: He is oriented to person, place, and time. He appears well-developed and well-nourished. No distress.  Rambling historian, difficult to keep on track  HENT:  Head: Normocephalic and atraumatic.  Mouth/Throat: Oropharynx is clear and moist. No oropharyngeal exudate.  Ulceration to lip  Eyes: Conjunctivae and EOM are normal. Pupils are equal, round, and reactive to light.  Neck: Normal range of motion. Neck supple.  No meningismus.  Cardiovascular: Normal rate, regular rhythm, normal heart sounds and intact distal pulses.   No murmur heard. Pulmonary/Chest: Effort normal and breath sounds normal. No respiratory distress. He exhibits no tenderness.  Abdominal: Soft. There is no tenderness. There is no rebound and no guarding.  Musculoskeletal: Normal range of motion. He exhibits no edema or tenderness.  Neurological: He is alert and oriented to person, place, and time. No cranial nerve deficit. He exhibits normal muscle tone.  Coordination normal.  No ataxia on finger to nose bilaterally. No pronator drift. 5/5 strength throughout. CN 2-12 intact.Equal grip strength. Sensation intact.  Slight LLE weakness at baseline Intact DP and PT pulses  Skin: Skin is warm.  Psychiatric: He has a normal mood and affect. His behavior is normal.  Nursing note and vitals reviewed.   ED Course  Procedures (including critical care time) Labs Review Labs Reviewed  BASIC METABOLIC PANEL - Abnormal; Notable for the following:    Glucose, Bld 181 (*)    All other components within normal limits  CBC - Abnormal; Notable for the following:  RBC 4.14 (*)    Platelets 143 (*)    All other components within normal limits  TROPONIN I  TROPONIN I    Imaging Review Dg Chest 2 View  07/18/2015  CLINICAL DATA:  Chest pain.  Shortness of breath. EXAM: CHEST  2 VIEW COMPARISON:  07/13/2015 FINDINGS: Post median sternotomy. The lungs are clear without airspace disease or pulmonary edema. Heart size is normal. No pleural effusions. Atherosclerotic calcifications in the aorta. No acute bone abnormality. IMPRESSION: No active cardiopulmonary disease. Electronically Signed   By: Markus Daft M.D.   On: 07/18/2015 15:27   I have personally reviewed and evaluated these images and lab results as part of my medical decision-making.   EKG Interpretation   Date/Time:  Wednesday July 18 2015 15:05:05 EST Ventricular Rate:  74 PR Interval:  176 QRS Duration: 102 QT Interval:  410 QTC Calculation: 455 R Axis:   84 Text Interpretation:  Normal sinus rhythm ST \\T \ T wave abnormality,  consider inferior ischemia Abnormal ECG No significant change was found  Reconfirmed by Wyvonnia Dusky  MD, Zeynep Fantroy [phone]) on 07/18/2015 4:58:04 PM      MDM   Final diagnoses:  None   Patient well known to the ED with frequent visits for chest pain.  Pain has since resolved. EKG is unchanged with T wave inversions inferiorly and laterally. Recent cardiology  records reviewed.  Chest x-ray negative. Troponin is negative. No further episodes of chest pain in the ED.  Patient is vague in his history and rambling. His EKG is unchanged with lateral ST depressions and T-wave inversions. ACS is a possibility. Doubt PE, doubt aortic disection. Troponin negative. LFTs and lipase normal.  Second troponin is negative at approximately 6 hours since the onset of chest pain. No further episodes of pain.  NO further chest pain, Troponin negative x2. EKG unchanged.  Follow up with his cardiologist. Return precautions discussed.    Ezequiel Essex, MD 07/18/15 2329

## 2015-07-23 ENCOUNTER — Emergency Department (HOSPITAL_COMMUNITY)
Admission: EM | Admit: 2015-07-23 | Discharge: 2015-07-23 | Disposition: A | Discharge status: Home / Self Care | Payer: Medicare Other | Attending: Emergency Medicine | Admitting: Emergency Medicine

## 2015-07-23 ENCOUNTER — Emergency Department (HOSPITAL_COMMUNITY): Payer: Medicare Other

## 2015-07-23 ENCOUNTER — Encounter (HOSPITAL_COMMUNITY): Payer: Self-pay

## 2015-07-23 ENCOUNTER — Other Ambulatory Visit: Payer: Self-pay | Admitting: Adult Health

## 2015-07-23 DIAGNOSIS — Z87430 Personal history of prostatic dysplasia: Secondary | ICD-10-CM | POA: Insufficient documentation

## 2015-07-23 DIAGNOSIS — R0789 Other chest pain: Secondary | ICD-10-CM

## 2015-07-23 DIAGNOSIS — I2510 Atherosclerotic heart disease of native coronary artery without angina pectoris: Secondary | ICD-10-CM | POA: Insufficient documentation

## 2015-07-23 DIAGNOSIS — I10 Essential (primary) hypertension: Secondary | ICD-10-CM | POA: Diagnosis not present

## 2015-07-23 DIAGNOSIS — Z79899 Other long term (current) drug therapy: Secondary | ICD-10-CM | POA: Diagnosis not present

## 2015-07-23 DIAGNOSIS — E119 Type 2 diabetes mellitus without complications: Secondary | ICD-10-CM | POA: Insufficient documentation

## 2015-07-23 DIAGNOSIS — Z8711 Personal history of peptic ulcer disease: Secondary | ICD-10-CM | POA: Diagnosis not present

## 2015-07-23 DIAGNOSIS — K219 Gastro-esophageal reflux disease without esophagitis: Secondary | ICD-10-CM | POA: Diagnosis not present

## 2015-07-23 DIAGNOSIS — Z951 Presence of aortocoronary bypass graft: Secondary | ICD-10-CM | POA: Diagnosis not present

## 2015-07-23 DIAGNOSIS — Z7982 Long term (current) use of aspirin: Secondary | ICD-10-CM | POA: Insufficient documentation

## 2015-07-23 DIAGNOSIS — Z8673 Personal history of transient ischemic attack (TIA), and cerebral infarction without residual deficits: Secondary | ICD-10-CM | POA: Diagnosis not present

## 2015-07-23 DIAGNOSIS — G8929 Other chronic pain: Secondary | ICD-10-CM | POA: Diagnosis not present

## 2015-07-23 DIAGNOSIS — Z87891 Personal history of nicotine dependence: Secondary | ICD-10-CM | POA: Diagnosis not present

## 2015-07-23 DIAGNOSIS — Z9861 Coronary angioplasty status: Secondary | ICD-10-CM | POA: Insufficient documentation

## 2015-07-23 DIAGNOSIS — M1990 Unspecified osteoarthritis, unspecified site: Secondary | ICD-10-CM | POA: Diagnosis not present

## 2015-07-23 DIAGNOSIS — R0602 Shortness of breath: Secondary | ICD-10-CM | POA: Diagnosis not present

## 2015-07-23 DIAGNOSIS — I252 Old myocardial infarction: Secondary | ICD-10-CM | POA: Diagnosis not present

## 2015-07-23 DIAGNOSIS — Z8701 Personal history of pneumonia (recurrent): Secondary | ICD-10-CM | POA: Diagnosis not present

## 2015-07-23 DIAGNOSIS — E785 Hyperlipidemia, unspecified: Secondary | ICD-10-CM | POA: Insufficient documentation

## 2015-07-23 DIAGNOSIS — Z8781 Personal history of (healed) traumatic fracture: Secondary | ICD-10-CM | POA: Insufficient documentation

## 2015-07-23 DIAGNOSIS — E039 Hypothyroidism, unspecified: Secondary | ICD-10-CM | POA: Diagnosis not present

## 2015-07-23 DIAGNOSIS — F418 Other specified anxiety disorders: Secondary | ICD-10-CM | POA: Diagnosis not present

## 2015-07-23 DIAGNOSIS — R079 Chest pain, unspecified: Secondary | ICD-10-CM | POA: Diagnosis present

## 2015-07-23 LAB — CBC
HCT: 42.5 % (ref 39.0–52.0)
Hemoglobin: 14.5 g/dL (ref 13.0–17.0)
MCH: 32.2 pg (ref 26.0–34.0)
MCHC: 34.1 g/dL (ref 30.0–36.0)
MCV: 94.2 fL (ref 78.0–100.0)
PLATELETS: 177 10*3/uL (ref 150–400)
RBC: 4.51 MIL/uL (ref 4.22–5.81)
RDW: 12.6 % (ref 11.5–15.5)
WBC: 9 10*3/uL (ref 4.0–10.5)

## 2015-07-23 LAB — BASIC METABOLIC PANEL
ANION GAP: 11 (ref 5–15)
BUN: 16 mg/dL (ref 6–20)
CALCIUM: 9.3 mg/dL (ref 8.9–10.3)
CO2: 23 mmol/L (ref 22–32)
CREATININE: 0.86 mg/dL (ref 0.61–1.24)
Chloride: 102 mmol/L (ref 101–111)
GFR calc Af Amer: >60 mL/min (ref >60)
GLUCOSE: 188 mg/dL — AB (ref 65–99)
Potassium: 4.3 mmol/L (ref 3.5–5.1)
Sodium: 136 mmol/L (ref 135–145)

## 2015-07-23 LAB — TROPONIN I: Troponin I: <0.03 ng/mL (ref <0.031)

## 2015-07-23 NOTE — Discharge Instructions (Signed)
If you were given medicines take as directed.  If you are on coumadin or contraceptives realize their levels and effectiveness is altered by many different medicines.  If you have any reaction (rash, tongues swelling, other) to the medicines stop taking and see a physician.    If your blood pressure was elevated in the ER make sure you follow up for management with a primary doctor or return for chest pain, shortness of breath or stroke symptoms.  Please follow up as directed and return to the ER or see a physician for new or worsening symptoms.  Thank you. Filed Vitals:   07/23/15 1105 07/23/15 1130 07/23/15 1200  BP: 162/83 137/77 123/67  Pulse: 83 72 72  Temp: 97.9 F (36.6 C)    TempSrc: Oral    Resp: 17 18 22   Height: 5\' 10"  (1.778 m)    Weight: 170 lb (77.111 kg)    SpO2: 100% 97% 96%

## 2015-07-23 NOTE — ED Provider Notes (Signed)
CSN: [id number]     Arrival date & time 07/23/15  1057 History  By signing my name below, I, Emmanuella Mensah, attest that this documentation has been prepared under the direction and in the presence of Elnora Morrison, MD. Electronically Signed: Judithann Sauger, ED Scribe. 07/23/2015. 11:30 AM.     Chief Complaint  Patient presents with  . Chest Pain   The history is provided by the patient. No language interpreter was used.   HPI Comments: Jeffrey [name] is a 82 y.o. male with a hx of DM, stroke, CAD, and chronic CP who presents to the Emergency Department complaining of gradually worsening generalized non-specific CP that is moving throughout his chest onset this am. He reports associated intermittent SOB. No alleviating factors noted. He denies any recent surgeries. He also denies any fever, chills, or acute leg swelling.   Past Medical History  Diagnosis Date  . Diabetes mellitus, type II ([place])   . Hyperlipidemia   . Coronary atherosclerosis of native coronary artery     a. CABG x 4 in 1989 (VG->OM1->OM2, VG->RCA, LIMA->LAD), b. 05/2010: DES to VG-OM1/OM2, DES to distal LCx. c. NSTEMI in 04/2011 - TO distal LCX stent and VG->OM2. d. 02/2012 NSTEMI DES to VG-OM1/continuation to OM2 occluded. e. inferior STEMI s/p DES to SVG-RAMUS 06/2012. f. inferolat STEMI 09/2012 s/p DES to SVG-interm; g. Lex MV (11/14):  EF 35%, inf-lat scar with small peri-infarct ischemia  . Essential hypertension, benign   . Osteoarthritis   . History of stroke   . History of pneumonia   . Cervical vertebral fracture ([place])   . Chronic back pain   . Benign prostatic hypertrophy     History of urinary retention  . Peptic ulcer disease   . Gastroesophageal reflux disease   . Ischemic cardiomyopathy Nov 2015    EF 35% cath, 45-50% by echo  . Hypothyroidism   . Anxiety about health     "multiple somatic complaints"  . Chronic chest pain    Past Surgical History  Procedure Laterality Date  . Tonsillectomy     . Coronary artery bypass graft  1989  . Coronary angioplasty  10/12, 8/13, 12/13, 3/14    SVG-OM PCI  . Cardiac catheterization  05/19/14    SVG-OM occl- medical Rx  . Left heart catheterization with coronary angiogram N/A 11/01/2011    Procedure: LEFT HEART CATHETERIZATION WITH CORONARY ANGIOGRAM;  Surgeon: Lorretta Harp, MD;  Location: [facility] CATH LAB;  Service: Cardiovascular;  Laterality: N/A;  . Percutaneous coronary stent intervention (pci-s) N/A 11/01/2011    Procedure: PERCUTANEOUS CORONARY STENT INTERVENTION (PCI-S);  Surgeon: Lorretta Harp, MD;  Location: [facility] CATH LAB;  Service: Cardiovascular;  Laterality: N/A;  . Left heart catheterization with coronary/graft angiogram N/A 03/10/2012    Procedure: LEFT HEART CATHETERIZATION WITH [name];  Surgeon: Sherren Mocha, MD;  Location: [facility] CATH LAB;  Service: Cardiovascular;  Laterality: N/A;  . Left heart catheterization with coronary angiogram N/A 06/18/2012    Procedure: LEFT HEART CATHETERIZATION WITH CORONARY ANGIOGRAM;  Surgeon: Peter M Martinique, MD;  Location: [facility] CATH LAB;  Service: Cardiovascular;  Laterality: N/A;  . Percutaneous coronary stent intervention (pci-s)  06/18/2012    Procedure: PERCUTANEOUS CORONARY STENT INTERVENTION (PCI-S);  Surgeon: Peter M Martinique, MD;  [facility] C[facility] LAB;  Service: Cardiovascular;;  . Left heart catheterization with coronary/graft angiogram  10/06/2012    Procedure: LEFT HEART CATHETERIZATION WITH [name];  Surgeon: Burnell Blanks, MD;  Location: [facility] CATH LAB;  Service: Cardiovascular;;  . Percutaneous coronary stent intervention (pci-s)  10/06/2012    Procedure: PERCUTANEOUS CORONARY STENT INTERVENTION (PCI-S);  Surgeon: Burnell Blanks, MD;  Location: [facility] CATH LAB;  Service: Cardiovascular;;  . Left heart catheterization with coronary/graft angiogram N/A 11/09/2012    Procedure: LEFT HEART CATHETERIZATION WITH [name];  Surgeon: Peter M  Martinique, MD;  Location: [facility] CATH LAB;  Service: Cardiovascular;  Laterality: N/A;  . Left heart catheterization with coronary/graft angiogram N/A 05/19/2014    Procedure: LEFT HEART CATHETERIZATION WITH [name];  Surgeon: Troy Sine, MD;  Location: [facility] CATH LAB;  Service: Cardiovascular;  Laterality: N/A;   Family History  Problem Relation Age of Onset  . Early death      Parents died young  . Appendicitis Mother     Pt was 3 year old  . Heart attack Father 11   Social History  Substance Use Topics  . Smoking status: Former Smoker -- 2.00 packs/day for 10 years    Types: Cigarettes    Start date: 07/14/1950    Quit date: 07/14/1961  . Smokeless tobacco: Current User    Types: Chew  . Alcohol Use: No    Review of Systems  Constitutional: Negative for fever and chills.  Respiratory: Positive for shortness of breath.   Cardiovascular: Positive for chest pain. Negative for leg swelling.  All other systems reviewed and are negative.     Allergies  Review of patient's allergies indicates no known allergies.  Home Medications   Prior to Admission medications   Medication Sig Start Date End Date Taking? Authorizing Provider  atorvastatin (LIPITOR) 40 MG tablet Take 1 tablet (40 mg total) by mouth daily at 6 PM. 05/29/13  Yes Scott T Weaver, PA-C  BRILINTA 90 MG TABS tablet TAKE 1 TABLET BY MOUTH TWICE DAILY. 05/15/15  Yes Lendon Colonel, NP  carvedilol (COREG) 3.125 MG tablet TAKE 1 TABLET BY MOUTH TWICE DAILY WITH MEALS. (HEART RATE & BLOOD PRESSURE) 03/05/15  Yes Lendon Colonel, NP  famotidine (PEPCID) 20 MG tablet Take 1 tablet (20 mg total) by mouth at bedtime. 03/03/15  Yes Rexene Alberts, MD  isosorbide mononitrate (IMDUR) 30 MG 24 hr tablet Take 1.5 tablets (45 mg total) by mouth daily. 03/03/15  Yes Rexene Alberts, MD  levothyroxine (SYNTHROID, LEVOTHROID) 50 MCG tablet Take 50 mcg by mouth daily before breakfast.  09/08/14  Yes Historical Provider, MD   metFORMIN (GLUCOPHAGE) 500 MG tablet Take 2 tablets (1,000 mg total) by mouth 2 (two) times daily with a meal. RESTART ON Sunday, 03/04/15. Patient taking differently: Take 500 mg by mouth 2 (two) times daily with a meal.  03/03/15  Yes Rexene Alberts, MD  Multiple Vitamin (MULTIVITAMIN WITH MINERALS) TABS Take 1 tablet by mouth daily.   Yes Historical Provider, MD  tamsulosin (FLOMAX) 0.4 MG CAPS Take 0.4 mg by mouth daily after supper.  01/27/13  Yes Historical Provider, MD  acetaminophen (TYLENOL) 325 MG tablet Take 2 tablets (650 mg total) by mouth every 4 (four) hours as needed for headache or mild pain. Patient taking differently: Take 325 mg by mouth 2 (two) times daily as needed for headache or mild pain.  05/21/14   Erlene Quan, PA-C  albuterol (PROVENTIL HFA;VENTOLIN HFA) 108 (90 BASE) MCG/ACT inhaler Inhale 2 puffs into the lungs every 6 (six) hours as needed for wheezing or shortness of breath.    Historical Provider, MD  aspirin 325 MG tablet Take 325 mg by mouth  daily as needed for moderate pain.    Historical Provider, MD  bismuth subsalicylate (PEPTO BISMOL) 262 MG/15ML suspension Take 30 mLs by mouth every 6 (six) hours as needed for indigestion or diarrhea or loose stools.     Historical Provider, MD  furosemide (LASIX) 20 MG tablet Take 20 mg by mouth as needed for fluid. Reported on 06/29/2015 05/06/13   Lendon Colonel, NP  loperamide (IMODIUM) 2 MG capsule Take 1 capsule (2 mg total) by mouth 4 (four) times daily as needed for diarrhea or loose stools. 04/01/15   Kristen N Ward, DO  NITROSTAT 0.4 MG SL tablet PLACE 1 TAB UNDER TONGUE EVERY 5 MIN IF NEEDED FOR CHEST PAIN. MAY USE 3 TIMES.NO RELIEF CALL 911. 07/04/15   Lendon Colonel, NP  potassium chloride (K-DUR) 10 MEQ tablet Take 10 mEq by mouth as needed. Takes along with the Furosemide when he has swelling.    Historical Provider, MD   BP 123/67 mmHg  Pulse 72  Temp(Src) 97.9 F (36.6 C) (Oral)  Resp 22  Ht 5\' 10"   (1.778 m)  Wt 170 lb (77.111 kg)  BMI 24.39 kg/m2  SpO2 96% Physical Exam  Constitutional: He is oriented to person, place, and time. He appears well-developed and well-nourished. No distress.  HENT:  Head: Normocephalic and atraumatic.  Eyes: Conjunctivae and EOM are normal.  Neck: Neck supple. No tracheal deviation present.  Cardiovascular: Normal rate and regular rhythm.   Pulmonary/Chest: Effort normal. No respiratory distress.  Lungs are clear, no respiratory difficulty  Abdominal: Soft. There is no tenderness.  Musculoskeletal: Normal range of motion.  No significant swelling  Neurological: He is alert and oriented to person, place, and time.  Skin: Skin is warm and dry.  Psychiatric: He has a normal mood and affect. His behavior is normal.  Nursing note and vitals reviewed.   ED Course  Procedures (including critical care time) DIAGNOSTIC STUDIES: Oxygen Saturation is 100% on RA, normal by my interpretation.    COORDINATION OF CARE: 11:28 AM- Pt advised of plan for treatment and pt agrees. Pt will receive lab work for further evaluation.    Labs Review Labs Reviewed  BASIC METABOLIC PANEL - Abnormal; Notable for the following:    Glucose, Bld 188 (*)    All other components within normal limits  CBC  TROPONIN I    Imaging Review No results found.   Elnora Morrison, MD has personally reviewed and evaluated these images and lab results as part of his medical decision-making.   EKG Interpretation   Date/Time:  Monday July 23 2015 11:04:53 EST Ventricular Rate:  84 PR Interval:  191 QRS Duration: 99 QT Interval:  398 QTC Calculation: 470 R Axis:   92 Text Interpretation:  Sinus rhythm Right axis deviation Nonspecific repol  abnormality, diffuse leads Similar previous Confirmed by Domanic Matusek  MD,  Mekisha Bittel ([id number]) on 07/23/2015 12:23:17 PM      MDM   Final diagnoses:  Chest pain, atypical   I personally performed the services described in this documentation,  which was scribed in my presence. The recorded information has been reviewed and is accurate.  Patient presents with recurrent atypical chest pain. Patient does have coronary artery disease history however patient has very atypical symptoms no significant chest pain right now. Patient has intermittent chest pain that moves around his abdomen and chest. Patient's well-appearing smiling. Discussed improving his diet and outpatient follow-up.  Results and differential diagnosis were discussed with the patient/parent/guardian. Xrays  were independently reviewed by myself.  Close follow up outpatient was discussed, comfortable with the plan.   Medications - No data to display  Filed Vitals:   07/23/15 1105 07/23/15 1130 07/23/15 1200  BP: 162/83 137/77 123/67  Pulse: 83 72 72  Temp: 97.9 F (36.6 C)    TempSrc: Oral    Resp: 17 18 22   Height: 5\' 10"  (1.778 m)    Weight: 170 lb (77.111 kg)    SpO2: 100% 97% 96%    Final diagnoses:  Chest pain, atypical      Elnora Morrison, MD 07/23/15 1238

## 2015-07-23 NOTE — ED Notes (Signed)
Patient reports of generalized chest pain that started this morning that woke him from sleep. Reports intermittent shortness of breath. States "it feels like indigestion but I haven't ate anything today." NAD noted.

## 2015-07-25 ENCOUNTER — Encounter (HOSPITAL_COMMUNITY): Payer: Self-pay

## 2015-07-25 ENCOUNTER — Emergency Department (HOSPITAL_COMMUNITY)
Admission: EM | Admit: 2015-07-25 | Discharge: 2015-07-25 | Disposition: A | Discharge status: Home / Self Care | Payer: Medicare Other | Attending: Emergency Medicine | Admitting: Emergency Medicine

## 2015-07-25 DIAGNOSIS — E039 Hypothyroidism, unspecified: Secondary | ICD-10-CM | POA: Diagnosis not present

## 2015-07-25 DIAGNOSIS — Z8701 Personal history of pneumonia (recurrent): Secondary | ICD-10-CM | POA: Diagnosis not present

## 2015-07-25 DIAGNOSIS — K1379 Other lesions of oral mucosa: Secondary | ICD-10-CM | POA: Insufficient documentation

## 2015-07-25 DIAGNOSIS — I2510 Atherosclerotic heart disease of native coronary artery without angina pectoris: Secondary | ICD-10-CM | POA: Diagnosis not present

## 2015-07-25 DIAGNOSIS — Z8659 Personal history of other mental and behavioral disorders: Secondary | ICD-10-CM | POA: Insufficient documentation

## 2015-07-25 DIAGNOSIS — Z79899 Other long term (current) drug therapy: Secondary | ICD-10-CM | POA: Insufficient documentation

## 2015-07-25 DIAGNOSIS — D229 Melanocytic nevi, unspecified: Secondary | ICD-10-CM | POA: Diagnosis not present

## 2015-07-25 DIAGNOSIS — Z7982 Long term (current) use of aspirin: Secondary | ICD-10-CM | POA: Insufficient documentation

## 2015-07-25 DIAGNOSIS — Z7984 Long term (current) use of oral hypoglycemic drugs: Secondary | ICD-10-CM | POA: Insufficient documentation

## 2015-07-25 DIAGNOSIS — Z87891 Personal history of nicotine dependence: Secondary | ICD-10-CM | POA: Insufficient documentation

## 2015-07-25 DIAGNOSIS — R0789 Other chest pain: Secondary | ICD-10-CM | POA: Diagnosis not present

## 2015-07-25 DIAGNOSIS — M1990 Unspecified osteoarthritis, unspecified site: Secondary | ICD-10-CM | POA: Diagnosis not present

## 2015-07-25 DIAGNOSIS — Z951 Presence of aortocoronary bypass graft: Secondary | ICD-10-CM | POA: Diagnosis not present

## 2015-07-25 DIAGNOSIS — G8929 Other chronic pain: Secondary | ICD-10-CM | POA: Diagnosis not present

## 2015-07-25 DIAGNOSIS — Z9889 Other specified postprocedural states: Secondary | ICD-10-CM | POA: Insufficient documentation

## 2015-07-25 DIAGNOSIS — E119 Type 2 diabetes mellitus without complications: Secondary | ICD-10-CM | POA: Insufficient documentation

## 2015-07-25 DIAGNOSIS — Z8711 Personal history of peptic ulcer disease: Secondary | ICD-10-CM | POA: Insufficient documentation

## 2015-07-25 DIAGNOSIS — I10 Essential (primary) hypertension: Secondary | ICD-10-CM | POA: Diagnosis not present

## 2015-07-25 DIAGNOSIS — N400 Enlarged prostate without lower urinary tract symptoms: Secondary | ICD-10-CM | POA: Diagnosis not present

## 2015-07-25 DIAGNOSIS — K219 Gastro-esophageal reflux disease without esophagitis: Secondary | ICD-10-CM | POA: Diagnosis not present

## 2015-07-25 DIAGNOSIS — E785 Hyperlipidemia, unspecified: Secondary | ICD-10-CM | POA: Diagnosis not present

## 2015-07-25 DIAGNOSIS — R079 Chest pain, unspecified: Secondary | ICD-10-CM | POA: Diagnosis present

## 2015-07-25 DIAGNOSIS — Z8673 Personal history of transient ischemic attack (TIA), and cerebral infarction without residual deficits: Secondary | ICD-10-CM | POA: Insufficient documentation

## 2015-07-25 LAB — I-STAT TROPONIN, ED: Troponin i, poc: 0.01 ng/mL (ref 0.00–0.08)

## 2015-07-25 NOTE — ED Notes (Signed)
MD at bedside. 

## 2015-07-25 NOTE — ED Provider Notes (Signed)
History  By signing my name below, I, Marlowe Kays, attest that this documentation has been prepared under the direction and in the presence of Carmin Muskrat, MD. Electronically Signed: Marlowe Kays, ED Scribe. 07/25/2015. 12:36 PM.  Chief Complaint  Patient presents with  . Chest Pain   The history is provided by the patient and medical records. No language interpreter was used.    HPI Comments:  Jeffrey [name] is a 82 y.o. male who presents to the Emergency Department complaining of CP that began upon waking this morning. He states the pain feels like indigestion. He reports associated generalized weakness that he reports is normal at baseline. He has not done anything to treat his pain. He denies modifying factors. He denies fever, chills, nausea, vomiting, cough, LOC, numbness, tingling or weakness of any extremity. He reports PMHx of CAD, DM, CVA and GERD. He denies smoking but reports chewing tobacco.  Past Medical History  Diagnosis Date  . Diabetes mellitus, type II ([place])   . Hyperlipidemia   . Coronary atherosclerosis of native coronary artery     a. CABG x 4 in 1989 (VG->OM1->OM2, VG->RCA, LIMA->LAD), b. 05/2010: DES to VG-OM1/OM2, DES to distal LCx. c. NSTEMI in 04/2011 - TO distal LCX stent and VG->OM2. d. 02/2012 NSTEMI DES to VG-OM1/continuation to OM2 occluded. e. inferior STEMI s/p DES to SVG-RAMUS 06/2012. f. inferolat STEMI 09/2012 s/p DES to SVG-interm; g. Lex MV (11/14):  EF 35%, inf-lat scar with small peri-infarct ischemia  . Essential hypertension, benign   . Osteoarthritis   . History of stroke   . History of pneumonia   . Cervical vertebral fracture ([place])   . Chronic back pain   . Benign prostatic hypertrophy     History of urinary retention  . Peptic ulcer disease   . Gastroesophageal reflux disease   . Ischemic cardiomyopathy Nov 2015    EF 35% cath, 45-50% by echo  . Hypothyroidism   . Anxiety about health     "multiple somatic complaints"  .  Chronic chest pain    Past Surgical History  Procedure Laterality Date  . Tonsillectomy    . Coronary artery bypass graft  1989  . Coronary angioplasty  10/12, 8/13, 12/13, 3/14    SVG-OM PCI  . Cardiac catheterization  05/19/14    SVG-OM occl- medical Rx  . Left heart catheterization with coronary angiogram N/A 11/01/2011    Procedure: LEFT HEART CATHETERIZATION WITH CORONARY ANGIOGRAM;  Surgeon: Lorretta Harp, MD;  Location[facility] CATH LAB;  Service: Cardiovascular;  Laterality: N/A;  . Percutaneous coronary stent intervention (pci-s) N/A 11/01/2011    Procedure: PERCUTANEOUS CORONARY STENT INTERVENTION (PCI-S);  Surgeon: Lorretta Harp, MD;  Location: [facility] CATH LAB;  Service: Cardiovascular;  Laterality: N/A;  . Left heart catheterization with coronary/graft angiogram N/A 03/10/2012    Procedure: LEFT HEART CATHETERIZATION WITH [name];  Surgeon: Sherren Mocha, MD;  Location: [facility] CATH LAB;  Service: Cardiovascular;  Laterality: N/A;  . Left heart catheterization with coronary angiogram N/A 06/18/2012    Procedure: LEFT HEART CATHETERIZATION WITH CORONARY ANGIOGRAM;  Surgeon: Peter M Martinique, MD;  Location: [facility] CATH LAB;  Service: Cardiovascular;  Laterality: N/A;  . Percutaneous coronary stent intervention (pci-s)  06/18/2012    Procedure: PERCUTANEOUS CORONARY STENT INTERVENTION (PCI-S);  Surgeon: Peter M Martinique, MD;  Location: [facility] CATH LAB;  Service: Cardiovascular;;  . Left heart catheterization with coronary/graft angiogram  10/06/2012    Procedure: LEFT HEART CATHETERIZATION WITH CORONARY/GRAFT ANGIOGRAM;  Surgeon: Burnell Blanks, MD;  Location: [facility] CATH LAB;  Service: Cardiovascular;;  . Percutaneous coronary stent intervention (pci-s)  10/06/2012    Procedure: PERCUTANEOUS CORONARY STENT INTERVENTION (PCI-S);  Surgeon: Burnell Blanks, MD;  Location: [facility] CATH LAB;  Service: Cardiovascular;;  . Left heart catheterization with coronary/graft angiogram N/A 11/09/2012     Procedure: LEFT HEART CATHETERIZATION WITH [name];  Surgeon: Peter M Martinique, MD;  Location: [facility] CATH LAB;  Service: Cardiovascular;  Laterality: N/A;  . Left heart catheterization with coronary/graft angiogram N/A 05/19/2014    Procedure: LEFT HEART CATHETERIZATION WITH [name];  Surgeon: Troy Sine, MD;  Location: [facility] CATH LAB;  Service: Cardiovascular;  Laterality: N/A;   Family History  Problem Relation Age of Onset  . Early death      Parents died young  . Appendicitis Mother     Pt was 51 year old  . Heart attack Father 53   Social History  Substance Use Topics  . Smoking status: Former Smoker -- 2.00 packs/day for 10 years    Types: Cigarettes    Start date: 07/14/1950    Quit date: 07/14/1961  . Smokeless tobacco: Current User    Types: Chew  . Alcohol Use: No    Review of Systems  Constitutional:       Per HPI, otherwise negative  HENT:       Per HPI, otherwise negative  Respiratory:       Per HPI, otherwise negative  Cardiovascular:       Per HPI, otherwise negative  Gastrointestinal: Negative for vomiting.  Endocrine:       Negative aside from HPI  Genitourinary:       Neg aside from HPI   Musculoskeletal:       Per HPI, otherwise negative  Skin: Negative.   Neurological: Negative for syncope.    Allergies  Review of patient's allergies indicates no known allergies.  Home Medications   Prior to Admission medications   Medication Sig Start Date End Date Taking? Authorizing Provider  acetaminophen (TYLENOL) 325 MG tablet Take 2 tablets (650 mg total) by mouth every 4 (four) hours as needed for headache or mild pain. Patient taking differently: Take 325 mg by mouth 2 (two) times daily as needed for headache or mild pain.  05/21/14  Yes Luke K Kilroy, PA-C  albuterol (PROVENTIL HFA;VENTOLIN HFA) 108 (90 BASE) MCG/ACT inhaler Inhale 2 puffs into the lungs every 6 (six) hours as needed for wheezing or shortness of breath.    Yes Historical Provider, MD  aspirin 325 MG tablet Take 325 mg by mouth daily as needed for moderate pain.   Yes Historical Provider, MD  atorvastatin (LIPITOR) 40 MG tablet Take 1 tablet (40 mg total) by mouth daily at 6 PM. 05/29/13  Yes Scott T Kathlen Mody, PA-C  bismuth subsalicylate (PEPTO BISMOL) 262 MG/15ML suspension Take 30 mLs by mouth every 6 (six) hours as needed for indigestion or diarrhea or loose stools.    Yes Historical Provider, MD  BRILINTA 90 MG TABS tablet TAKE 1 TABLET BY MOUTH TWICE DAILY. 05/15/15  Yes Lendon Colonel, NP  carvedilol (COREG) 3.125 MG tablet TAKE 1 TABLET BY MOUTH TWICE DAILY WITH MEALS. (HEART RATE & BLOOD PRESSURE) 03/05/15  Yes Lendon Colonel, NP  famotidine (PEPCID) 20 MG tablet Take 1 tablet (20 mg total) by mouth at bedtime. 03/03/15  Yes Rexene Alberts, MD  furosemide (LASIX) 20 MG tablet Take 20 mg by mouth as  needed for fluid. Reported on 06/29/2015 05/06/13  Yes Lendon Colonel, NP  isosorbide mononitrate (IMDUR) 30 MG 24 hr tablet Take 1.5 tablets (45 mg total) by mouth daily. 03/03/15  Yes Rexene Alberts, MD  levothyroxine (SYNTHROID, LEVOTHROID) 50 MCG tablet Take 50 mcg by mouth daily before breakfast.  09/08/14  Yes Historical Provider, MD  loperamide (IMODIUM) 2 MG capsule Take 1 capsule (2 mg total) by mouth 4 (four) times daily as needed for diarrhea or loose stools. 04/01/15  Yes Kristen N Ward, DO  metFORMIN (GLUCOPHAGE) 500 MG tablet Take 2 tablets (1,000 mg total) by mouth 2 (two) times daily with a meal. RESTART ON Sunday, 03/04/15. Patient taking differently: Take 500 mg by mouth 2 (two) times daily with a meal.  03/03/15  Yes Rexene Alberts, MD  Multiple Vitamin (MULTIVITAMIN WITH MINERALS) TABS Take 1 tablet by mouth daily.   Yes Historical Provider, MD  NITROSTAT 0.4 MG SL tablet PLACE 1 TAB UNDER TONGUE EVERY 5 MIN IF NEEDED FOR CHEST PAIN. MAY USE 3 TIMES.NO RELIEF CALL 911. 07/04/15  Yes Lendon Colonel, NP  potassium chloride (K-DUR)  10 MEQ tablet Take 10 mEq by mouth as needed. Takes along with the Furosemide when he has swelling.   Yes Historical Provider, MD  tamsulosin (FLOMAX) 0.4 MG CAPS Take 0.4 mg by mouth daily after supper.  01/27/13  Yes Historical Provider, MD   Triage Vitals: BP 154/87 mmHg  Pulse 93  Temp(Src) 97.9 F (36.6 C) (Oral)  Resp 13  Ht 5\' 10"  (1.778 m)  Wt 170 lb (77.111 kg)  BMI 24.39 kg/m2  SpO2 99% Physical Exam  Constitutional: He is oriented to person, place, and time. He appears well-developed and well-nourished.  HENT:  Head: Normocephalic and atraumatic.  Left inferior lip with scabbed, dry, 1 cm lesion  Eyes: EOM are normal.  Neck: Normal range of motion.  Cardiovascular: Normal rate, regular rhythm and normal heart sounds.   No murmur heard. Pulmonary/Chest: Effort normal and breath sounds normal. No respiratory distress.  Musculoskeletal: Normal range of motion.  Neurological: He is alert and oriented to person, place, and time.  Skin: Skin is warm and dry.  Multiple nevi  Psychiatric: He has a normal mood and affect. His behavior is normal.  Nursing note and vitals reviewed.   ED Course  Procedures (including critical care time) DIAGNOSTIC STUDIES: Oxygen Saturation is 99% on RA, normal by my interpretation.   COORDINATION OF CARE: 12:36 PM- Will order labs. Pt verbalizes understanding and agrees to plan.  Medications - No data to display  [place], ED    Imaging Review No results found. I have personally reviewed and evaluated these images and lab results as part of my medical decision-making.   EKG Interpretation   Date/Time:  Wednesday July 25 2015 12:17:40 EST Ventricular Rate:  93 PR Interval:  190 QRS Duration: 107 QT Interval:  377 QTC Calculation: 469 R Axis:   87 Text Interpretation:  Sinus rhythm Inferoposterior infarct, recent Abnrm  T, consider ischemia, anterolateral lds Sinus rhythm T wave  abnormality  Abnormal ekg Confirmed by Carmin Muskrat  MD [phone]) on 07/25/2015  12:27:11 PM     Chart review notable for 28 prior visits within 6 months, typically for chest pain, largely with reassuring results. Here, the patient has cardiac monitor 80 sinus normal Pulse oximetry 96% room air normal On repeat exam the patient is in no distress. We again discussed his  chest pain, and his lip lesion. Patient provided referral to dermatology, as well as encouraged to follow-up with primary care.  MDM   Final diagnoses:  Atypical chest pain    I personally performed the services described in this documentation, which was scribed in my presence. The recorded information has been reviewed and is accurate.    Patient presents with concern of recurrent chest pain. Here, the patient is awake, alert, in no distress. Patient has had pain for the past day, and normal troponin is reassuring. No ST elevation MI. Patient is currently appropriately managed medically. Patient also has lip lesion, which she was encouraged to follow-up with dermatology to evaluate. Patient discharged in table condition.   Carmin Muskrat, MD 07/25/15 1330

## 2015-07-25 NOTE — ED Notes (Signed)
Pt reports woke up with chest pain this morning.  Pt says pain moves back and forth from left side of chest to right.  Reports pain is minimal at this time.

## 2015-07-25 NOTE — Discharge Instructions (Signed)
As discussed, your evaluation today has been largely reassuring.  But, it is important that you monitor your condition carefully, and do not hesitate to return to the ED if you develop new, or concerning changes in your condition. ? ?Otherwise, please follow-up with your physician for appropriate ongoing care. ? ?

## 2015-07-26 ENCOUNTER — Other Ambulatory Visit: Payer: Self-pay | Admitting: Adult Health

## 2015-07-27 ENCOUNTER — Encounter (HOSPITAL_COMMUNITY): Payer: Self-pay | Admitting: *Deleted

## 2015-07-27 ENCOUNTER — Emergency Department (HOSPITAL_COMMUNITY): Payer: Medicare Other

## 2015-07-27 ENCOUNTER — Emergency Department (HOSPITAL_COMMUNITY)
Admission: EM | Admit: 2015-07-27 | Discharge: 2015-07-27 | Disposition: A | Discharge status: Home / Self Care | Payer: Medicare Other | Attending: Emergency Medicine | Admitting: Emergency Medicine

## 2015-07-27 DIAGNOSIS — Z8673 Personal history of transient ischemic attack (TIA), and cerebral infarction without residual deficits: Secondary | ICD-10-CM | POA: Insufficient documentation

## 2015-07-27 DIAGNOSIS — Z951 Presence of aortocoronary bypass graft: Secondary | ICD-10-CM | POA: Insufficient documentation

## 2015-07-27 DIAGNOSIS — R220 Localized swelling, mass and lump, head: Secondary | ICD-10-CM | POA: Diagnosis not present

## 2015-07-27 DIAGNOSIS — N400 Enlarged prostate without lower urinary tract symptoms: Secondary | ICD-10-CM | POA: Insufficient documentation

## 2015-07-27 DIAGNOSIS — G8929 Other chronic pain: Secondary | ICD-10-CM | POA: Insufficient documentation

## 2015-07-27 DIAGNOSIS — I252 Old myocardial infarction: Secondary | ICD-10-CM | POA: Diagnosis not present

## 2015-07-27 DIAGNOSIS — Z7982 Long term (current) use of aspirin: Secondary | ICD-10-CM | POA: Insufficient documentation

## 2015-07-27 DIAGNOSIS — I10 Essential (primary) hypertension: Secondary | ICD-10-CM | POA: Diagnosis not present

## 2015-07-27 DIAGNOSIS — Z87891 Personal history of nicotine dependence: Secondary | ICD-10-CM | POA: Insufficient documentation

## 2015-07-27 DIAGNOSIS — R079 Chest pain, unspecified: Secondary | ICD-10-CM | POA: Insufficient documentation

## 2015-07-27 DIAGNOSIS — F419 Anxiety disorder, unspecified: Secondary | ICD-10-CM | POA: Diagnosis not present

## 2015-07-27 DIAGNOSIS — Z9861 Coronary angioplasty status: Secondary | ICD-10-CM | POA: Insufficient documentation

## 2015-07-27 DIAGNOSIS — Z9889 Other specified postprocedural states: Secondary | ICD-10-CM | POA: Diagnosis not present

## 2015-07-27 DIAGNOSIS — I2510 Atherosclerotic heart disease of native coronary artery without angina pectoris: Secondary | ICD-10-CM | POA: Insufficient documentation

## 2015-07-27 DIAGNOSIS — E039 Hypothyroidism, unspecified: Secondary | ICD-10-CM | POA: Diagnosis not present

## 2015-07-27 DIAGNOSIS — Z79899 Other long term (current) drug therapy: Secondary | ICD-10-CM | POA: Insufficient documentation

## 2015-07-27 DIAGNOSIS — Z8701 Personal history of pneumonia (recurrent): Secondary | ICD-10-CM | POA: Diagnosis not present

## 2015-07-27 DIAGNOSIS — K219 Gastro-esophageal reflux disease without esophagitis: Secondary | ICD-10-CM | POA: Diagnosis not present

## 2015-07-27 DIAGNOSIS — M1990 Unspecified osteoarthritis, unspecified site: Secondary | ICD-10-CM | POA: Diagnosis not present

## 2015-07-27 DIAGNOSIS — Z8781 Personal history of (healed) traumatic fracture: Secondary | ICD-10-CM | POA: Diagnosis not present

## 2015-07-27 DIAGNOSIS — E119 Type 2 diabetes mellitus without complications: Secondary | ICD-10-CM | POA: Insufficient documentation

## 2015-07-27 DIAGNOSIS — E785 Hyperlipidemia, unspecified: Secondary | ICD-10-CM | POA: Insufficient documentation

## 2015-07-27 DIAGNOSIS — Z8711 Personal history of peptic ulcer disease: Secondary | ICD-10-CM | POA: Insufficient documentation

## 2015-07-27 LAB — CBC
HEMATOCRIT: 37.5 % — AB (ref 39.0–52.0)
HEMOGLOBIN: 12.6 g/dL — AB (ref 13.0–17.0)
MCH: 31.9 pg (ref 26.0–34.0)
MCHC: 33.6 g/dL (ref 30.0–36.0)
MCV: 94.9 fL (ref 78.0–100.0)
Platelets: 136 10*3/uL — ABNORMAL LOW (ref 150–400)
RBC: 3.95 MIL/uL — AB (ref 4.22–5.81)
RDW: 12.7 % (ref 11.5–15.5)
WBC: 6.4 10*3/uL (ref 4.0–10.5)

## 2015-07-27 LAB — TROPONIN I: Troponin I: <0.03 ng/mL (ref <0.031)

## 2015-07-27 LAB — COMPREHENSIVE METABOLIC PANEL
ALK PHOS: 72 U/L (ref 38–126)
ALT: 24 U/L (ref 17–63)
AST: 23 U/L (ref 15–41)
Albumin: 3.6 g/dL (ref 3.5–5.0)
Anion gap: 9 (ref 5–15)
BUN: 15 mg/dL (ref 6–20)
CALCIUM: 8.9 mg/dL (ref 8.9–10.3)
CO2: 24 mmol/L (ref 22–32)
CREATININE: 0.78 mg/dL (ref 0.61–1.24)
Chloride: 105 mmol/L (ref 101–111)
Glucose, Bld: 253 mg/dL — ABNORMAL HIGH (ref 65–99)
Potassium: 4.1 mmol/L (ref 3.5–5.1)
SODIUM: 138 mmol/L (ref 135–145)
Total Bilirubin: 0.5 mg/dL (ref 0.3–1.2)
Total Protein: 6.2 g/dL — ABNORMAL LOW (ref 6.5–8.1)

## 2015-07-27 LAB — APTT: aPTT: 26 seconds (ref 24–37)

## 2015-07-27 MED ORDER — NITROGLYCERIN 0.4 MG SL SUBL: 0.4000 mg | SUBLINGUAL_TABLET | SUBLINGUAL | Status: DC | PRN

## 2015-07-27 MED ORDER — ASPIRIN 81 MG PO CHEW
324.0000 mg | CHEWABLE_TABLET | Freq: Once | ORAL | Status: AC
  Administered 2015-07-27: 324 mg via ORAL
  Filled 2015-07-27: qty 4

## 2015-07-27 NOTE — ED Notes (Signed)
Pt states he noticed pain to his chest when he woke up this morning. Uncertain of what time that was. Pt states pain "knocked him down". Pt states minimal pain at present. States he is uncertain if it was pain, more of a "funny feeling" and states he has had the same to happen in the past. NAD at this time.

## 2015-07-27 NOTE — Discharge Instructions (Signed)

## 2015-07-27 NOTE — ED Provider Notes (Signed)
CSN: [id number]     Arrival date & time 07/27/15  [id number] History   First MD Initiated Contact with Patient 07/27/15 0745     Chief Complaint  Patient presents with  . Chest Pain     (Consider location/radiation/quality/duration/timing/severity/associated sxs/prior Treatment) HPI Comments: The pt is an 82 y/o male - has hx of frequent visits to the ED, states that he gets CP daily - and has a history of CAD s/p CABG and stenting as well as DM and hyperlipidemia - he states that he got out of bed this AM and upon getting out of bed felt a strong pain in his R chest that made him stumbled followed by a hurting across his whole chest for the last hour - constant, not exertional and not assocaited with SOB, cough, fevers or swelling of the legs.  Sx are currently presnt.  Patient is a 82 y.o. male presenting with chest pain. The history is provided by the patient.  Chest Pain   Past Medical History  Diagnosis Date  . Diabetes mellitus, type II ([place])   . Hyperlipidemia   . Coronary atherosclerosis of native coronary artery     a. CABG x 4 in 1989 (VG->OM1->OM2, VG->RCA, LIMA->LAD), b. 05/2010: DES to VG-OM1/OM2, DES to distal LCx. c. NSTEMI in 04/2011 - TO distal LCX stent and VG->OM2. d. 02/2012 NSTEMI DES to VG-OM1/continuation to OM2 occluded. e. inferior STEMI s/p DES to SVG-RAMUS 06/2012. f. inferolat STEMI 09/2012 s/p DES to SVG-interm; g. Lex MV (11/14):  EF 35%, inf-lat scar with small peri-infarct ischemia  . Essential hypertension, benign   . Osteoarthritis   . History of stroke   . History of pneumonia   . Cervical vertebral fracture ([place])   . Chronic back pain   . Benign prostatic hypertrophy     History of urinary retention  . Peptic ulcer disease   . Gastroesophageal reflux disease   . Ischemic cardiomyopathy Nov 2015    EF 35% cath, 45-50% by echo  . Hypothyroidism   . Anxiety about health     "multiple somatic complaints"  . Chronic chest pain    Past Surgical History   Procedure Laterality Date  . Tonsillectomy    . Coronary artery bypass graft  1989  . Coronary angioplasty  10/12, 8/13, 12/13, 3/14    SVG-OM PCI  . Cardiac catheterization  05/19/14    SVG-OM occl- medical Rx  . Left heart catheterization with coronary angiogram N/A 11/01/2011    Procedure: LEFT HEART CATHETERIZATION WITH CORONARY ANGIOGRAM;  Surgeon: Lorretta Harp, MD;  Location: [facility] CATH LAB;  Service: Cardiovascular;  Laterality: N/A;  . Percutaneous coronary stent intervention (pci-s) N/A 11/01/2011    Procedure: PERCUTANEOUS CORONARY STENT INTERVENTION (PCI-S);  Surgeon: Lorretta Harp, MD;  Location: [facility] CATH LAB;  Service: Cardiovascular;  Laterality: N/A;  . Left heart catheterization with coronary/graft angiogram N/A 03/10/2012    Procedure: LEFT HEART CATHETERIZATION WITH [name];  Surgeon: Sherren Mocha, MD;  Location: [facility] CATH LAB;  Service: Cardiovascular;  Laterality: N/A;  . Left heart catheterization with coronary angiogram N/A 06/18/2012    Procedure: LEFT HEART CATHETERIZATION WITH CORONARY ANGIOGRAM;  Surgeon: Peter M Martinique, MD;  Location: [facility] CATH LAB;  Service: Cardiovascular;  Laterality: N/A;  . Percutaneous coronary stent intervention (pci-s)  06/18/2012    Procedure: PERCUTANEOUS CORONARY STENT INTERVENTION (PCI-S);  Surgeon: Peter M Martinique, MD;  Location: [facility] CATH LAB;  Service: Cardiovascular;;  . Left heart catheterization with coronary/graft  angiogram  10/06/2012    Procedure: LEFT HEART CATHETERIZATION WITH [name];  Surgeon: Burnell Blanks, MD;  Location: [facility] CATH LAB;  Service: Cardiovascular;;  . Percutaneous coronary stent intervention (pci-s)  10/06/2012    Procedure: PERCUTANEOUS CORONARY STENT INTERVENTION (PCI-S);  Surgeon: Burnell Blanks, MD;  Location: [facility] CATH LAB;  Service: Cardiovascular;;  . Left heart catheterization with coronary/graft angiogram N/A 11/09/2012    Procedure: LEFT HEART CATHETERIZATION  WITH [name];  Surgeon: Peter M Martinique, MD;  Location: [facility] CATH LAB;  Service: Cardiovascular;  Laterality: N/A;  . Left heart catheterization with coronary/graft angiogram N/A 05/19/2014    Procedure: LEFT HEART CATHETERIZATION WITH [name];  Surgeon: Troy Sine, MD;  Location: [facility] CATH LAB;  Service: Cardiovascular;  Laterality: N/A;   Family History  Problem Relation Age of Onset  . Early death      Parents died young  . Appendicitis Mother     Pt was 73 year old  . Heart attack Father 44   Social History  Substance Use Topics  . Smoking status: Former Smoker -- 2.00 packs/day for 10 years    Types: Cigarettes    Start date: 07/14/1950    Quit date: 07/14/1961  . Smokeless tobacco: Current User    Types: Chew  . Alcohol Use: No    Review of Systems  Cardiovascular: Positive for chest pain.  All other systems reviewed and are negative.     Allergies  Review of patient's allergies indicates no known allergies.  Home Medications   Prior to Admission medications   Medication Sig Start Date End Date Taking? Authorizing Provider  acetaminophen (TYLENOL) 325 MG tablet Take 2 tablets (650 mg total) by mouth every 4 (four) hours as needed for headache or mild pain. Patient taking differently: Take 325 mg by mouth 2 (two) times daily as needed for headache or mild pain.  05/21/14  Yes Luke K Kilroy, PA-C  albuterol (PROVENTIL HFA;VENTOLIN HFA) 108 (90 BASE) MCG/ACT inhaler Inhale 2 puffs into the lungs every 6 (six) hours as needed for wheezing or shortness of breath.   Yes Historical Provider, MD  aspirin 325 MG tablet Take 325 mg by mouth daily as needed for moderate pain.   Yes Historical Provider, MD  atorvastatin (LIPITOR) 40 MG tablet Take 1 tablet (40 mg total) by mouth daily at 6 PM. 05/29/13  Yes Scott T Kathlen Mody, PA-C  bismuth subsalicylate (PEPTO BISMOL) 262 MG/15ML suspension Take 30 mLs by mouth every 6 (six) hours as needed for  indigestion or diarrhea or loose stools.    Yes Historical Provider, MD  BRILINTA 90 MG TABS tablet TAKE 1 TABLET BY MOUTH TWICE DAILY. 05/15/15  Yes Lendon Colonel, NP  carvedilol (COREG) 3.125 MG tablet TAKE 1 TABLET BY MOUTH TWICE DAILY WITH MEALS. 07/26/15  Yes Lendon Colonel, NP  famotidine (PEPCID) 20 MG tablet Take 1 tablet (20 mg total) by mouth at bedtime. 03/03/15  Yes Rexene Alberts, MD  furosemide (LASIX) 20 MG tablet Take 20 mg by mouth as needed for fluid. Reported on 06/29/2015 05/06/13  Yes Lendon Colonel, NP  isosorbide mononitrate (IMDUR) 30 MG 24 hr tablet Take 1.5 tablets (45 mg total) by mouth daily. 03/03/15  Yes Rexene Alberts, MD  levothyroxine (SYNTHROID, LEVOTHROID) 50 MCG tablet Take 50 mcg by mouth daily before breakfast.  09/08/14  Yes Historical Provider, MD  loperamide (IMODIUM) 2 MG capsule Take 1 capsule (2 mg total) by mouth 4 (four)  times daily as needed for diarrhea or loose stools. 04/01/15  Yes Kristen N Ward, DO  metFORMIN (GLUCOPHAGE) 500 MG tablet Take 2 tablets (1,000 mg total) by mouth 2 (two) times daily with a meal. RESTART ON Sunday, 03/04/15. Patient taking differently: Take 500 mg by mouth 2 (two) times daily with a meal.  03/03/15  Yes Rexene Alberts, MD  Multiple Vitamin (MULTIVITAMIN WITH MINERALS) TABS Take 1 tablet by mouth daily.   Yes Historical Provider, MD  NITROSTAT 0.4 MG SL tablet PLACE 1 TAB UNDER TONGUE EVERY 5 MIN IF NEEDED FOR CHEST PAIN. MAY USE 3 TIMES.NO RELIEF CALL 911. 07/04/15  Yes Lendon Colonel, NP  tamsulosin (FLOMAX) 0.4 MG CAPS Take 0.4 mg by mouth 2 (two) times daily.  01/27/13  Yes Historical Provider, MD   BP 162/85 mmHg  Pulse 75  Temp(Src) 98.1 F (36.7 C) (Oral)  Resp 12  Ht 5\' 10"  (1.778 m)  Wt 170 lb (77.111 kg)  BMI 24.39 kg/m2  SpO2 99% Physical Exam  Constitutional: He appears well-developed and well-nourished. No distress.  HENT:  Head: Normocephalic and atraumatic.  Mouth/Throat: Oropharynx is clear  and moist. No oropharyngeal exudate.  Mass on the L lower lip - pt told that this could be a cancer and needed a biopsy  Eyes: Conjunctivae and EOM are normal. Pupils are equal, round, and reactive to light. Right eye exhibits no discharge. Left eye exhibits no discharge. No scleral icterus.  Neck: Normal range of motion. Neck supple. No JVD present. No thyromegaly present.  Cardiovascular: Normal rate, regular rhythm, normal heart sounds and intact distal pulses.  Exam reveals no gallop and no friction rub.   No murmur heard. Pulmonary/Chest: Effort normal and breath sounds normal. No respiratory distress. He has no wheezes. He has no rales.  Abdominal: Soft. Bowel sounds are normal. He exhibits no distension and no mass. There is no tenderness.  Musculoskeletal: Normal range of motion. He exhibits no edema or tenderness.  Lymphadenopathy:    He has no cervical adenopathy.  Neurological: He is alert. Coordination normal.  Skin: Skin is warm and dry. No rash noted. No erythema.  Psychiatric: He has a normal mood and affect. His behavior is normal.  Nursing note and vitals reviewed.   ED Course  Procedures (including critical care time) Labs Review Labs Reviewed  CBC - Abnormal; Notable for the following:    RBC 3.95 (*)    Hemoglobin 12.6 (*)    HCT 37.5 (*)    Platelets 136 (*)    All other components within normal limits  COMPREHENSIVE METABOLIC PANEL - Abnormal; Notable for the following:    Glucose, Bld 253 (*)    Total Protein 6.2 (*)    All other components within normal limits  APTT  TROPONIN I  TROPONIN I    Imaging Review Dg Chest Portable 1 View  07/27/2015  CLINICAL DATA:  Chest pain this morning. EXAM: PORTABLE CHEST 1 VIEW COMPARISON:  07/18/2015 FINDINGS: The heart is normal in size and stable. There is tortuosity and calcification of the thoracic aorta. Stable surgical changes from bypass surgery. The lungs are clear. No pleural effusion. The bony thorax is  intact. IMPRESSION: No acute cardiopulmonary findings. Electronically Signed   By: Marijo Sanes M.D.   On: 07/27/2015 08:14   I have personally reviewed and evaluated these images and lab results as part of my medical decision-making.    MDM   Final diagnoses:  None   ED  ECG REPORT  I personally interpreted this EKG   Date: 07/27/2015   Rate: 85  Rhythm: normal sinus rhythm  QRS Axis: normal  Intervals: normal  ST/T Wave abnormalities: nonspecific T wave changes  Conduction Disutrbances:none  Narrative Interpretation:   Old EKG Reviewed: unchanged c/w 07/25/15   No murmurs, no edema, soft and NT abdomen - ECG without acute fidnings - similar to old ECG's - r/o MI with 2 troponins - pt otherwise has frequent CP episodes and often presents concerned that the next time he feels pain it will be an MI.  Reassurance given that we are looking into this and will review the EMR.  Cath report from 66 / 2015 per Dr. Claiborne Billings  Since the patient's last catheterization. His previously restenotic SVG to OM1/OM2, which was intervened upon is now completely occluded. He now has improved collaterals to a circumflex marginal vessel via the LIMA to LAD distal collaterals. His RCA is occluded as is the graft but moderate collaterals exist both from septal perforating arteries as well as the LIMA to LAD. An increased medical regimen will be initially recommended with further titration of Ranexa from 500 mg twice a day to 1000 g twice a day, addition of oral nitrate therapy, and slight titration of his carvedilol to 6.25 mg twice a day.  Second trop neg Pt well appearing, no signs of ACS on ECG, monitoring or repeat trop - D/w with return precautions.  Noemi Chapel, MD 07/27/15 [phone]

## 2015-08-08 ENCOUNTER — Ambulatory Visit (INDEPENDENT_AMBULATORY_CARE_PROVIDER_SITE_OTHER): Payer: Medicare Other | Admitting: Cardiovascular Disease

## 2015-08-08 ENCOUNTER — Encounter: Payer: Self-pay | Admitting: Cardiovascular Disease

## 2015-08-08 VITALS — BP 138/78 | HR 89 | Ht 69.0 in | Wt 177.0 lb

## 2015-08-08 DIAGNOSIS — Z87898 Personal history of other specified conditions: Secondary | ICD-10-CM | POA: Diagnosis not present

## 2015-08-08 DIAGNOSIS — I10 Essential (primary) hypertension: Secondary | ICD-10-CM

## 2015-08-08 DIAGNOSIS — E785 Hyperlipidemia, unspecified: Secondary | ICD-10-CM

## 2015-08-08 DIAGNOSIS — I25708 Atherosclerosis of coronary artery bypass graft(s), unspecified, with other forms of angina pectoris: Secondary | ICD-10-CM | POA: Diagnosis not present

## 2015-08-08 DIAGNOSIS — I519 Heart disease, unspecified: Secondary | ICD-10-CM

## 2015-08-08 DIAGNOSIS — Z9289 Personal history of other medical treatment: Secondary | ICD-10-CM

## 2015-08-08 MED ORDER — TICAGRELOR 60 MG PO TABS: 60.0000 mg | ORAL_TABLET | Freq: Two times a day (BID) | ORAL | Status: DC

## 2015-08-08 NOTE — Progress Notes (Signed)
Patient ID: Jeffrey [name], male   DOB: 09/26/1928, 82 y.o.   MRN: [id number]      SUBJECTIVE: The patient presents for follow up of CAD and CABG. Frequent ED visits for chest pain, most recently on 07/27/15. Troponins normal. ECG showed sinus rhythm with nonspecific ST-T abnormalities. CXR without acute findings.  05/2014 cath: LVEF 35%, occluded LAD, occluded LCX, occluded RCA. Patent LIMA-LAD, occluded SVG to OM1/OM2, occluded SVG-RCA. Patient with collaterals to RCA and OM territories.   Echo 05/27/15 LVEF 45-50% and grade I diastolic dysfunction.  Denies chest pain and shortness of breath today. Forgot to take metformin, feels somewhat weak.  Says he takes ASA 325 for hip pain.  Lives by himself.  Review of Systems: As per "subjective", otherwise negative.  No Known Allergies  Current Outpatient Prescriptions  Medication Sig Dispense Refill  . acetaminophen (TYLENOL) 325 MG tablet Take 2 tablets (650 mg total) by mouth every 4 (four) hours as needed for headache or mild pain. (Patient taking differently: Take 325 mg by mouth 2 (two) times daily as needed for headache or mild pain. )    . albuterol (PROVENTIL HFA;VENTOLIN HFA) 108 (90 BASE) MCG/ACT inhaler Inhale 2 puffs into the lungs every 6 (six) hours as needed for wheezing or shortness of breath.    Marland Kitchen aspirin 325 MG tablet Take 325 mg by mouth daily as needed for moderate pain.    Marland Kitchen atorvastatin (LIPITOR) 40 MG tablet Take 1 tablet (40 mg total) by mouth daily at 6 PM. 30 tablet 11  . bismuth subsalicylate (PEPTO BISMOL) 262 MG/15ML suspension Take 30 mLs by mouth every 6 (six) hours as needed for indigestion or diarrhea or loose stools.     Marland Kitchen BRILINTA 90 MG TABS tablet TAKE 1 TABLET BY MOUTH TWICE DAILY. 60 tablet 3  . carvedilol (COREG) 3.125 MG tablet TAKE 1 TABLET BY MOUTH TWICE DAILY WITH MEALS. 60 tablet 6  . famotidine (PEPCID) 20 MG tablet Take 1 tablet (20 mg total) by mouth at bedtime. 30 tablet 3  . furosemide  (LASIX) 20 MG tablet Take 20 mg by mouth as needed for fluid. Reported on 06/29/2015    . isosorbide mononitrate (IMDUR) 30 MG 24 hr tablet Take 1.5 tablets (45 mg total) by mouth daily. 45 tablet 11  . levothyroxine (SYNTHROID, LEVOTHROID) 50 MCG tablet Take 50 mcg by mouth daily before breakfast.     . loperamide (IMODIUM) 2 MG capsule Take 1 capsule (2 mg total) by mouth 4 (four) times daily as needed for diarrhea or loose stools. 12 capsule 0  . metFORMIN (GLUCOPHAGE) 500 MG tablet Take 2 tablets (1,000 mg total) by mouth 2 (two) times daily with a meal. RESTART ON Sunday, 03/04/15. (Patient taking differently: Take 500 mg by mouth 2 (two) times daily with a meal. )    . Multiple Vitamin (MULTIVITAMIN WITH MINERALS) TABS Take 1 tablet by mouth daily.    Marland Kitchen NITROSTAT 0.4 MG SL tablet PLACE 1 TAB UNDER TONGUE EVERY 5 MIN IF NEEDED FOR CHEST PAIN. MAY USE 3 TIMES.NO RELIEF CALL 911. 25 tablet 3  . tamsulosin (FLOMAX) 0.4 MG CAPS Take 0.4 mg by mouth 2 (two) times daily.      No current facility-administered medications for this visit.    Past Medical History  Diagnosis Date  . Diabetes mellitus, type II ([place])   . Hyperlipidemia   . Coronary atherosclerosis of native coronary artery     a. CABG x 4 in 1989 (  VG->OM1->OM2, VG->RCA, LIMA->LAD), b. 05/2010: DES to VG-OM1/OM2, DES to distal LCx. c. NSTEMI in 04/2011 - TO distal LCX stent and VG->OM2. d. 02/2012 NSTEMI DES to VG-OM1/continuation to OM2 occluded. e. inferior STEMI s/p DES to SVG-RAMUS 06/2012. f. inferolat STEMI 09/2012 s/p DES to SVG-interm; g. Lex MV (11/14):  EF 35%, inf-lat scar with small peri-infarct ischemia  . Essential hypertension, benign   . Osteoarthritis   . History of stroke   . History of pneumonia   . Cervical vertebral fracture ([place])   . Chronic back pain   . Benign prostatic hypertrophy     History of urinary retention  . Peptic ulcer disease   . Gastroesophageal reflux disease   . Ischemic cardiomyopathy Nov 2015      EF 35% cath, 45-50% by echo  . Hypothyroidism   . Anxiety about health     "multiple somatic complaints"  . Chronic chest pain     Past Surgical History  Procedure Laterality Date  . Tonsillectomy    . Coronary artery bypass graft  1989  . Coronary angioplasty  10/12, 8/13, 12/13, 3/14    SVG-OM PCI  . Cardiac catheterization  05/19/14    SVG-OM occl- medical Rx  . Left heart catheterization with coronary angiogram N/A 11/01/2011    Procedure: LEFT HEART CATHETERIZATION WITH CORONARY ANGIOGRAM;  Surgeon: Lorretta Harp, MD;  Location: [facility] CATH LAB;  Service: Cardiovascular;  Laterality: N/A;  . Percutaneous coronary stent intervention (pci-s) N/A 11/01/2011    Procedure: PERCUTANEOUS CORONARY STENT INTERVENTION (PCI-S);  Surgeon: Lorretta Harp, MD;  Location: [facility] CATH LAB;  Service: Cardiovascular;  Laterality: N/A;  . Left heart catheterization with coronary/graft angiogram N/A 03/10/2012    Procedure: LEFT HEART CATHETERIZATION WITH [name];  Surgeon: Sherren Mocha, MD;  Location: [facility] CATH LAB;  Service: Cardiovascular;  Laterality: N/A;  . Left heart catheterization with coronary angiogram N/A 06/18/2012    Procedure: LEFT HEART CATHETERIZATION WITH CORONARY ANGIOGRAM;  Surgeon: Peter M Martinique, MD;  Location: [facility] CATH LAB;  Service: Cardiovascular;  Laterality: N/A;  . Percutaneous coronary stent intervention (pci-s)  06/18/2012    Procedure: PERCUTANEOUS CORONARY STENT INTERVENTION (PCI-S);  Surgeon: Peter M Martinique, MD;  Location: [facility] CATH LAB;  Service: Cardiovascular;;  . Left heart catheterization with coronary/graft angiogram  10/06/2012    Procedure: LEFT HEART CATHETERIZATION WITH [name];  Surgeon: Burnell Blanks, MD;  Location: [facility] CATH LAB;  Service: Cardiovascular;;  . Percutaneous coronary stent intervention (pci-s)  10/06/2012    Procedure: PERCUTANEOUS CORONARY STENT INTERVENTION (PCI-S);  Surgeon: Burnell Blanks, MD;   Location: [facility] CATH LAB;  Service: Cardiovascular;;  . Left heart catheterization with coronary/graft angiogram N/A 11/09/2012    Procedure: LEFT HEART CATHETERIZATION WITH [name];  Surgeon: Peter M Martinique, MD;  Location: [facility] CATH LAB;  Service: Cardiovascular;  Laterality: N/A;  . Left heart catheterization with coronary/graft angiogram N/A 05/19/2014    Procedure: LEFT HEART CATHETERIZATION WITH [name];  Surgeon: Troy Sine, MD;  Location: [facility] CATH LAB;  Service: Cardiovascular;  Laterality: N/A;    Social History   Social History  . Marital Status: Divorced    Spouse Name: N/A  . Number of Children: N/A  . Years of Education: N/A   Occupational History  . Retired     Designer, television/film set   Social History Main Topics  . Smoking status: Former Smoker -- 2.00 packs/day for 10 years    Types: Cigarettes    Start date:  07/14/1950    Quit date: 07/14/1961  . Smokeless tobacco: Current User    Types: Chew  . Alcohol Use: No  . Drug Use: Not on file  . Sexual Activity: No   Other Topics Concern  . Not on file   Social History Narrative   ** Merged History Encounter **...   He was raised by his uncle.  His mother deceased when   he was 39 year old with appendicitis. Father deceased from an MI at age   24.  He has 1 sister, but he does not know her health status as they   have been separated.            Filed Vitals:   08/08/15 1032  BP: 138/78  Pulse: 89  Height: 5\' 9"  (1.753 m)  Weight: 177 lb (80.287 kg)  SpO2: 96%    PHYSICAL EXAM General: NAD HEENT: Normal. Neck: No JVD, no thyromegaly. Lungs: Diminished throughout, no rales/wheezes. CV: Nondisplaced PMI.  Regular rate and rhythm, normal S1/S2, no S3/S4, no murmur. No pretibial or periankle edema.  No carotid bruit.   Abdomen: Soft, no distention.  Neurologic: Alert and oriented.  Psych: Normal affect. Skin: Normal. Musculoskeletal: No gross deformities. Extremities: No clubbing or  cyanosis.   ECG: Most recent ECG reviewed.      ASSESSMENT AND PLAN: 1. CAD: Symptomatically stable today. Continue current therapy with ASA (reduce to 81 mg), Lipitor, low-dose Coreg, Imdur, and Brilinta (given multiple coronary interventions). Will reduce to 60 mg bid. Will add Ranexa if he develops recurrent chest pain.  2. Essential HTN: Controlled with Coreg. No changes.  3. Hyperlipidemia: Continue Lipitor 40 mg daily.  Dispo: f/u 6 months with NP  [name], M.D., F.A.C.C.

## 2015-08-08 NOTE — Patient Instructions (Signed)
Medication Instructions:  DECREASE BRILINTA TO 60 MG TWO TIMES DAILY DECREASE ASPIRIN TO 81 MG DAILY  Labwork: NONE  Testing/Procedures: NONE  Follow-Up: Your physician wants you to follow-up in: 6 MONTHS.  You will receive a reminder letter in the mail two months in advance. If you don't receive a letter, please call our office to schedule the follow-up appointment.   Any Other Special Instructions Will Be Listed Below (If Applicable).     If you need a refill on your cardiac medications before your next appointment, please call your pharmacy.

## 2015-08-10 ENCOUNTER — Encounter (HOSPITAL_COMMUNITY): Payer: Self-pay

## 2015-08-10 ENCOUNTER — Emergency Department (HOSPITAL_COMMUNITY)
Admission: EM | Admit: 2015-08-10 | Discharge: 2015-08-10 | Disposition: A | Discharge status: Home / Self Care | Payer: Medicare Other | Attending: Emergency Medicine | Admitting: Emergency Medicine

## 2015-08-10 DIAGNOSIS — K1379 Other lesions of oral mucosa: Secondary | ICD-10-CM | POA: Diagnosis not present

## 2015-08-10 DIAGNOSIS — Z7982 Long term (current) use of aspirin: Secondary | ICD-10-CM | POA: Insufficient documentation

## 2015-08-10 DIAGNOSIS — Z8711 Personal history of peptic ulcer disease: Secondary | ICD-10-CM | POA: Insufficient documentation

## 2015-08-10 DIAGNOSIS — Z79899 Other long term (current) drug therapy: Secondary | ICD-10-CM | POA: Insufficient documentation

## 2015-08-10 DIAGNOSIS — Z951 Presence of aortocoronary bypass graft: Secondary | ICD-10-CM | POA: Diagnosis not present

## 2015-08-10 DIAGNOSIS — Z7984 Long term (current) use of oral hypoglycemic drugs: Secondary | ICD-10-CM | POA: Diagnosis not present

## 2015-08-10 DIAGNOSIS — K219 Gastro-esophageal reflux disease without esophagitis: Secondary | ICD-10-CM | POA: Insufficient documentation

## 2015-08-10 DIAGNOSIS — I10 Essential (primary) hypertension: Secondary | ICD-10-CM | POA: Insufficient documentation

## 2015-08-10 DIAGNOSIS — I2510 Atherosclerotic heart disease of native coronary artery without angina pectoris: Secondary | ICD-10-CM | POA: Diagnosis not present

## 2015-08-10 DIAGNOSIS — Z87891 Personal history of nicotine dependence: Secondary | ICD-10-CM | POA: Diagnosis not present

## 2015-08-10 DIAGNOSIS — Z9889 Other specified postprocedural states: Secondary | ICD-10-CM | POA: Diagnosis not present

## 2015-08-10 DIAGNOSIS — I252 Old myocardial infarction: Secondary | ICD-10-CM | POA: Insufficient documentation

## 2015-08-10 DIAGNOSIS — Z9861 Coronary angioplasty status: Secondary | ICD-10-CM | POA: Insufficient documentation

## 2015-08-10 DIAGNOSIS — Z8673 Personal history of transient ischemic attack (TIA), and cerebral infarction without residual deficits: Secondary | ICD-10-CM | POA: Diagnosis not present

## 2015-08-10 DIAGNOSIS — Z8701 Personal history of pneumonia (recurrent): Secondary | ICD-10-CM | POA: Insufficient documentation

## 2015-08-10 DIAGNOSIS — M1990 Unspecified osteoarthritis, unspecified site: Secondary | ICD-10-CM | POA: Diagnosis not present

## 2015-08-10 DIAGNOSIS — E785 Hyperlipidemia, unspecified: Secondary | ICD-10-CM | POA: Insufficient documentation

## 2015-08-10 DIAGNOSIS — R1013 Epigastric pain: Secondary | ICD-10-CM | POA: Diagnosis not present

## 2015-08-10 DIAGNOSIS — R079 Chest pain, unspecified: Secondary | ICD-10-CM | POA: Insufficient documentation

## 2015-08-10 DIAGNOSIS — N400 Enlarged prostate without lower urinary tract symptoms: Secondary | ICD-10-CM | POA: Insufficient documentation

## 2015-08-10 DIAGNOSIS — Z8781 Personal history of (healed) traumatic fracture: Secondary | ICD-10-CM | POA: Diagnosis not present

## 2015-08-10 DIAGNOSIS — E039 Hypothyroidism, unspecified: Secondary | ICD-10-CM | POA: Diagnosis not present

## 2015-08-10 DIAGNOSIS — E119 Type 2 diabetes mellitus without complications: Secondary | ICD-10-CM | POA: Insufficient documentation

## 2015-08-10 DIAGNOSIS — F418 Other specified anxiety disorders: Secondary | ICD-10-CM | POA: Insufficient documentation

## 2015-08-10 DIAGNOSIS — G8929 Other chronic pain: Secondary | ICD-10-CM | POA: Diagnosis not present

## 2015-08-10 LAB — I-STAT TROPONIN, ED
TROPONIN I, POC: 0.01 ng/mL (ref 0.00–0.08)
Troponin i, poc: 0.01 ng/mL (ref 0.00–0.08)

## 2015-08-10 NOTE — Discharge Instructions (Signed)
Nonspecific Chest Pain  °Chest pain can be caused by many different conditions. There is always a chance that your pain could be related to something serious, such as a heart attack or a blood clot in your lungs. Chest pain can also be caused by conditions that are not life-threatening. If you have chest pain, it is very important to follow up with your health care provider. °CAUSES  °Chest pain can be caused by: °· Heartburn. °· Pneumonia or bronchitis. °· Anxiety or stress. °· Inflammation around your heart (pericarditis) or lung (pleuritis or pleurisy). °· A blood clot in your lung. °· A collapsed lung (pneumothorax). It can develop suddenly on its own (spontaneous pneumothorax) or from trauma to the chest. °· Shingles infection (varicella-zoster virus). °· Heart attack. °· Damage to the bones, muscles, and cartilage that make up your chest wall. This can include: °¨ Bruised bones due to injury. °¨ Strained muscles or cartilage due to frequent or repeated coughing or overwork. °¨ Fracture to one or more ribs. °¨ Sore cartilage due to inflammation (costochondritis). °RISK FACTORS  °Risk factors for chest pain may include: °· Activities that increase your risk for trauma or injury to your chest. °· Respiratory infections or conditions that cause frequent coughing. °· Medical conditions or overeating that can cause heartburn. °· Heart disease or family history of heart disease. °· Conditions or health behaviors that increase your risk of developing a blood clot. °· Having had chicken pox (varicella zoster). °SIGNS AND SYMPTOMS °Chest pain can feel like: °· Burning or tingling on the surface of your chest or deep in your chest. °· Crushing, pressure, aching, or squeezing pain. °· Dull or sharp pain that is worse when you move, cough, or take a deep breath. °· Pain that is also felt in your back, neck, shoulder, or arm, or pain that spreads to any of these areas. °Your chest pain may come and go, or it may stay  constant. °DIAGNOSIS °Lab tests or other studies may be needed to find the cause of your pain. Your health care provider may have you take a test called an ambulatory ECG (electrocardiogram). An ECG records your heartbeat patterns at the time the test is performed. You may also have other tests, such as: °· Transthoracic echocardiogram (TTE). During echocardiography, sound waves are used to create a picture of all of the heart structures and to look at how blood flows through your heart. °· Transesophageal echocardiogram (TEE). This is a more advanced imaging test that obtains images from inside your body. It allows your health care provider to see your heart in finer detail. °· Cardiac monitoring. This allows your health care provider to monitor your heart rate and rhythm in real time. °· Holter monitor. This is a portable device that records your heartbeat and can help to diagnose abnormal heartbeats. It allows your health care provider to track your heart activity for several days, if needed. °· Stress tests. These can be done through exercise or by taking medicine that makes your heart beat more quickly. °· Blood tests. °· Imaging tests. °TREATMENT  °Your treatment depends on what is causing your chest pain. Treatment may include: °· Medicines. These may include: °¨ Acid blockers for heartburn. °¨ Anti-inflammatory medicine. °¨ Pain medicine for inflammatory conditions. °¨ Antibiotic medicine, if an infection is present. °¨ Medicines to dissolve blood clots. °¨ Medicines to treat coronary artery disease. °· Supportive care for conditions that do not require medicines. This may include: °¨ Resting. °¨ Applying heat   or cold packs to injured areas. °¨ Limiting activities until pain decreases. °HOME CARE INSTRUCTIONS °· If you were prescribed an antibiotic medicine, finish it all even if you start to feel better. °· Avoid any activities that bring on chest pain. °· Do not use any tobacco products, including  cigarettes, chewing tobacco, or electronic cigarettes. If you need help quitting, ask your health care provider. °· Do not drink alcohol. °· Take medicines only as directed by your health care provider. °· Keep all follow-up visits as directed by your health care provider. This is important. This includes any further testing if your chest pain does not go away. °· If heartburn is the cause for your chest pain, you may be told to keep your head raised (elevated) while sleeping. This reduces the chance that acid will go from your stomach into your esophagus. °· Make lifestyle changes as directed by your health care provider. These may include: °¨ Getting regular exercise. Ask your health care provider to suggest some activities that are safe for you. °¨ Eating a heart-healthy diet. A registered dietitian can help you to learn healthy eating options. °¨ Maintaining a healthy weight. °¨ Managing diabetes, if necessary. °¨ Reducing stress. °SEEK MEDICAL CARE IF: °· Your chest pain does not go away after treatment. °· You have a rash with blisters on your chest. °· You have a fever. °SEEK IMMEDIATE MEDICAL CARE IF:  °· Your chest pain is worse. °· You have an increasing cough, or you cough up blood. °· You have severe abdominal pain. °· You have severe weakness. °· You faint. °· You have chills. °· You have sudden, unexplained chest discomfort. °· You have sudden, unexplained discomfort in your arms, back, neck, or jaw. °· You have shortness of breath at any time. °· You suddenly start to sweat, or your skin gets clammy. °· You feel nauseous or you vomit. °· You suddenly feel light-headed or dizzy. °· Your heart begins to beat quickly, or it feels like it is skipping beats. °These symptoms may represent a serious problem that is an emergency. Do not wait to see if the symptoms will go away. Get medical help right away. Call your local emergency services (911 in the [place] Do not drive yourself to the hospital. °  °This  information is not intended to replace advice given to you by your health care provider. Make sure you discuss any questions you have with your health care provider. °  °Document Released: 04/09/2005 Document Revised: 07/21/2014 Document Reviewed: 02/03/2014 °Elsevier Interactive Patient Education ©2016 [employer] ° °

## 2015-08-10 NOTE — ED Provider Notes (Signed)
CSN: [id number]     Arrival date & time 08/10/15  0709 History   First MD Initiated Contact with Patient 08/10/15 0732     Chief Complaint  Patient presents with  . Chest Pain     Patient is a 82 y.o. male presenting with chest pain. The history is provided by the patient.  Chest Pain Associated symptoms: abdominal pain   Associated symptoms: no back pain and no headache    patient presents with epigastric pain. Began this morning after eating and taking his pills. It was dull. States it felt somewhat like his previous reflux. No diaphoresis. He does have a history of severe coronary artery disease. He also has been to the ER 31 times in the last 6 months for chest pain. Last saw his cardiologist 2 days ago. His been doing well with exertion last few days. No cough. No other abdominal pain. No swelling in his legs. States he's been taking his medicine.  Past Medical History  Diagnosis Date  . Diabetes mellitus, type II ([place])   . Hyperlipidemia   . Coronary atherosclerosis of native coronary artery     a. CABG x 4 in 1989 (VG->OM1->OM2, VG->RCA, LIMA->LAD), b. 05/2010: DES to VG-OM1/OM2, DES to distal LCx. c. NSTEMI in 04/2011 - TO distal LCX stent and VG->OM2. d. 02/2012 NSTEMI DES to VG-OM1/continuation to OM2 occluded. e. inferior STEMI s/p DES to SVG-RAMUS 06/2012. f. inferolat STEMI 09/2012 s/p DES to SVG-interm; g. Lex MV (11/14):  EF 35%, inf-lat scar with small peri-infarct ischemia  . Essential hypertension, benign   . Osteoarthritis   . History of stroke   . History of pneumonia   . Cervical vertebral fracture ([place])   . Chronic back pain   . Benign prostatic hypertrophy     History of urinary retention  . Peptic ulcer disease   . Gastroesophageal reflux disease   . Ischemic cardiomyopathy Nov 2015    EF 35% cath, 45-50% by echo  . Hypothyroidism   . Anxiety about health     "multiple somatic complaints"  . Chronic chest pain    Past Surgical History  Procedure Laterality  Date  . Tonsillectomy    . Coronary artery bypass graft  1989  . Coronary angioplasty  10/12, 8/13, 12/13, 3/14    SVG-OM PCI  . Cardiac catheterization  05/19/14    SVG-OM occl- medical Rx  . Left heart catheterization with coronary angiogram N/A 11/01/2011    Procedure: LEFT HEART CATHETERIZATION WITH CORONARY ANGIOGRAM;  Surgeon: Lorretta Harp, MD;  Location: [facility] CATH LAB;  Service: Cardiovascular;  Laterality: N/A;  . Percutaneous coronary stent intervention (pci-s) N/A 11/01/2011    Procedure: PERCUTANEOUS CORONARY STENT INTERVENTION (PCI-S);  Surgeon: Lorretta Harp, MD;  Location: [facility] CATH LAB;  Service: Cardiovascular;  Laterality: N/A;  . Left heart catheterization with coronary/graft angiogram N/A 03/10/2012    Procedure: LEFT HEART CATHETERIZATION WITH [name];  Surgeon: Sherren Mocha, MD;  Location: [facility] CATH LAB;  Service: Cardiovascular;  Laterality: N/A;  . Left heart catheterization with coronary angiogram N/A 06/18/2012    Procedure: LEFT HEART CATHETERIZATION WITH CORONARY ANGIOGRAM;  Surgeon: Peter M Martinique, MD;  Location: [facility] CATH LAB;  Service: Cardiovascular;  Laterality: N/A;  . Percutaneous coronary stent intervention (pci-s)  06/18/2012    Procedure: PERCUTANEOUS CORONARY STENT INTERVENTION (PCI-S);  Surgeon: Peter M Martinique, MD;  Location: [facility] CATH LAB;  Service: Cardiovascular;;  . Left heart catheterization with coronary/graft angiogram  10/06/2012  Procedure: LEFT HEART CATHETERIZATION WITH [name];  Surgeon: Burnell Blanks, MD;  Location: [facility] CATH LAB;  Service: Cardiovascular;;  . Percutaneous coronary stent intervention (pci-s)  10/06/2012    Procedure: PERCUTANEOUS CORONARY STENT INTERVENTION (PCI-S);  Surgeon: Burnell Blanks, MD;  Location: [facility] CATH LAB;  Service: Cardiovascular;;  . Left heart catheterization with coronary/graft angiogram N/A 11/09/2012    Procedure: LEFT HEART CATHETERIZATION WITH [name];  Surgeon: Peter M Martinique, MD;  Location: [facility] CATH LAB;  Service: Cardiovascular;  Laterality: N/A;  . Left heart catheterization with coronary/graft angiogram N/A 05/19/2014    Procedure: LEFT HEART CATHETERIZATION WITH [name];  Surgeon: Troy Sine, MD;  Location: [facility] CATH LAB;  Service: Cardiovascular;  Laterality: N/A;   Family History  Problem Relation Age of Onset  . Early death      Parents died young  . Appendicitis Mother     Pt was 82 year old  . Heart attack Father 33   Social History  Substance Use Topics  . Smoking status: Former Smoker -- 2.00 packs/day for 10 years    Types: Cigarettes    Start date: 07/14/1950    Quit date: 07/14/1961  . Smokeless tobacco: Current User    Types: Chew  . Alcohol Use: No    Review of Systems  Constitutional: Negative for appetite change.  Cardiovascular: Positive for chest pain.  Gastrointestinal: Positive for abdominal pain.  Genitourinary: Negative for flank pain.  Musculoskeletal: Negative for back pain.  Skin: Positive for wound.  Neurological: Negative for headaches.  Psychiatric/Behavioral: Negative for confusion.      Allergies  Review of patient's allergies indicates no known allergies.  Home Medications   Prior to Admission medications   Medication Sig Start Date End Date Taking? Authorizing Provider  acetaminophen (TYLENOL) 325 MG tablet Take 2 tablets (650 mg total) by mouth every 4 (four) hours as needed for headache or mild pain. Patient taking differently: Take 325 mg by mouth 2 (two) times daily as needed for headache or mild pain.  05/21/14  Yes Luke K Kilroy, PA-C  albuterol (PROVENTIL HFA;VENTOLIN HFA) 108 (90 BASE) MCG/ACT inhaler Inhale 2 puffs into the lungs every 6 (six) hours as needed for wheezing or shortness of breath.   Yes Historical Provider, MD  ALPRAZolam (XANAX) 0.25 MG tablet Take 0.25 mg by mouth 2 (two) times daily.   Yes Historical Provider, MD  aspirin 81 MG  tablet Take 81 mg by mouth daily.   Yes Historical Provider, MD  atorvastatin (LIPITOR) 40 MG tablet Take 1 tablet (40 mg total) by mouth daily at 6 PM. 05/29/13  Yes Scott T Kathlen Mody, PA-C  bismuth subsalicylate (PEPTO BISMOL) 262 MG/15ML suspension Take 30 mLs by mouth every 6 (six) hours as needed for indigestion or diarrhea or loose stools.    Yes Historical Provider, MD  carvedilol (COREG) 3.125 MG tablet TAKE 1 TABLET BY MOUTH TWICE DAILY WITH MEALS. 07/26/15  Yes Lendon Colonel, NP  famotidine (PEPCID) 20 MG tablet Take 1 tablet (20 mg total) by mouth at bedtime. 03/03/15  Yes Rexene Alberts, MD  furosemide (LASIX) 20 MG tablet Take 20 mg by mouth as needed for fluid. Reported on 06/29/2015 05/06/13  Yes Lendon Colonel, NP  isosorbide mononitrate (IMDUR) 30 MG 24 hr tablet Take 1.5 tablets (45 mg total) by mouth daily. 03/03/15  Yes Rexene Alberts, MD  levothyroxine (SYNTHROID, LEVOTHROID) 50 MCG tablet Take 50 mcg by mouth daily before breakfast.  09/08/14  Yes Historical Provider, MD  loperamide (IMODIUM) 2 MG capsule Take 1 capsule (2 mg total) by mouth 4 (four) times daily as needed for diarrhea or loose stools. 04/01/15  Yes Kristen N Ward, DO  metFORMIN (GLUCOPHAGE) 500 MG tablet Take 2 tablets (1,000 mg total) by mouth 2 (two) times daily with a meal. RESTART ON Sunday, 03/04/15. Patient taking differently: Take 500 mg by mouth 2 (two) times daily with a meal.  03/03/15  Yes Rexene Alberts, MD  Multiple Vitamin (MULTIVITAMIN WITH MINERALS) TABS Take 1 tablet by mouth daily.   Yes Historical Provider, MD  tamsulosin (FLOMAX) 0.4 MG CAPS Take 0.4 mg by mouth 2 (two) times daily.  01/27/13  Yes Historical Provider, MD  ticagrelor (BRILINTA) 60 MG TABS tablet Take 1 tablet (60 mg total) by mouth 2 (two) times daily. Patient taking differently: Take 90 mg by mouth 2 (two) times daily.  08/08/15  Yes Herminio Commons, MD  NITROSTAT 0.4 MG SL tablet PLACE 1 TAB UNDER TONGUE EVERY 5 MIN IF NEEDED  FOR CHEST PAIN. MAY USE 3 TIMES.NO RELIEF CALL 911. 07/04/15   Lendon Colonel, NP   BP 132/68 mmHg  Pulse 70  Temp(Src) 98 F (36.7 C) (Oral)  Resp 14  Ht 5\' 10"  (1.778 m)  Wt 170 lb (77.111 kg)  BMI 24.39 kg/m2  SpO2 100% Physical Exam  Constitutional: He appears well-developed.  HENT:  Patient has lesion to left side of his lower lip. States he is on $300 cream for it.  Cardiovascular: Normal rate.   Pulmonary/Chest: Effort normal.  Abdominal: Soft. There is no tenderness.  Musculoskeletal: Normal range of motion.  Neurological: He is alert.  Skin: Skin is warm.    ED Course  Procedures (including critical care time) [place], ED  I-STAT TROPOININ, ED    Imaging Review No results found. I have personally reviewed and evaluated these images and lab results as part of my medical decision-making.   EKG Interpretation   Date/Time:  Friday August 10 2015 07:17:40 EST Ventricular Rate:  80 PR Interval:  189 QRS Duration: 103 QT Interval:  371 QTC Calculation: 428 R Axis:   88 Text Interpretation:  Sinus rhythm Ventricular premature complex  Borderline right axis deviation Nonspecific repol abnormality, diffuse  leads No significant change since last tracing PVC is new Confirmed by  Kendyl Bissonnette  MD, [place] [phone]) on 08/10/2015 7:22:05 AM      MDM   Final diagnoses:  Chest pain, unspecified chest pain type   patient with chest pain. EKG reassuring and troponin negative 2. Has chronic chest pain. Seen by cardiology 2 days ago. Will discharge home.  Davonna Belling, MD 08/10/15 1540

## 2015-08-10 NOTE — ED Notes (Signed)
Pt reports chest pain that started after eating breakfast around 0600 this morning.  Denies nausea or sob.

## 2015-08-12 ENCOUNTER — Emergency Department (HOSPITAL_COMMUNITY): Payer: Medicare Other

## 2015-08-12 ENCOUNTER — Emergency Department (HOSPITAL_COMMUNITY)
Admission: EM | Admit: 2015-08-12 | Discharge: 2015-08-12 | Disposition: A | Discharge status: Home / Self Care | Payer: Medicare Other | Attending: Emergency Medicine | Admitting: Emergency Medicine

## 2015-08-12 ENCOUNTER — Encounter (HOSPITAL_COMMUNITY): Payer: Self-pay | Admitting: *Deleted

## 2015-08-12 DIAGNOSIS — E039 Hypothyroidism, unspecified: Secondary | ICD-10-CM | POA: Diagnosis not present

## 2015-08-12 DIAGNOSIS — Z8711 Personal history of peptic ulcer disease: Secondary | ICD-10-CM | POA: Diagnosis not present

## 2015-08-12 DIAGNOSIS — G8929 Other chronic pain: Secondary | ICD-10-CM | POA: Insufficient documentation

## 2015-08-12 DIAGNOSIS — F419 Anxiety disorder, unspecified: Secondary | ICD-10-CM | POA: Diagnosis not present

## 2015-08-12 DIAGNOSIS — E119 Type 2 diabetes mellitus without complications: Secondary | ICD-10-CM | POA: Insufficient documentation

## 2015-08-12 DIAGNOSIS — Z951 Presence of aortocoronary bypass graft: Secondary | ICD-10-CM | POA: Diagnosis not present

## 2015-08-12 DIAGNOSIS — M1990 Unspecified osteoarthritis, unspecified site: Secondary | ICD-10-CM | POA: Insufficient documentation

## 2015-08-12 DIAGNOSIS — Z8701 Personal history of pneumonia (recurrent): Secondary | ICD-10-CM | POA: Insufficient documentation

## 2015-08-12 DIAGNOSIS — Z9861 Coronary angioplasty status: Secondary | ICD-10-CM | POA: Diagnosis not present

## 2015-08-12 DIAGNOSIS — Z8781 Personal history of (healed) traumatic fracture: Secondary | ICD-10-CM | POA: Diagnosis not present

## 2015-08-12 DIAGNOSIS — Z7982 Long term (current) use of aspirin: Secondary | ICD-10-CM | POA: Insufficient documentation

## 2015-08-12 DIAGNOSIS — K219 Gastro-esophageal reflux disease without esophagitis: Secondary | ICD-10-CM | POA: Diagnosis not present

## 2015-08-12 DIAGNOSIS — R079 Chest pain, unspecified: Secondary | ICD-10-CM | POA: Diagnosis present

## 2015-08-12 DIAGNOSIS — Z9889 Other specified postprocedural states: Secondary | ICD-10-CM | POA: Diagnosis not present

## 2015-08-12 DIAGNOSIS — I2510 Atherosclerotic heart disease of native coronary artery without angina pectoris: Secondary | ICD-10-CM | POA: Diagnosis not present

## 2015-08-12 DIAGNOSIS — N400 Enlarged prostate without lower urinary tract symptoms: Secondary | ICD-10-CM | POA: Insufficient documentation

## 2015-08-12 DIAGNOSIS — Z87891 Personal history of nicotine dependence: Secondary | ICD-10-CM | POA: Diagnosis not present

## 2015-08-12 DIAGNOSIS — Z7984 Long term (current) use of oral hypoglycemic drugs: Secondary | ICD-10-CM | POA: Insufficient documentation

## 2015-08-12 DIAGNOSIS — Z8673 Personal history of transient ischemic attack (TIA), and cerebral infarction without residual deficits: Secondary | ICD-10-CM | POA: Diagnosis not present

## 2015-08-12 DIAGNOSIS — E785 Hyperlipidemia, unspecified: Secondary | ICD-10-CM | POA: Insufficient documentation

## 2015-08-12 DIAGNOSIS — I10 Essential (primary) hypertension: Secondary | ICD-10-CM | POA: Insufficient documentation

## 2015-08-12 DIAGNOSIS — I252 Old myocardial infarction: Secondary | ICD-10-CM | POA: Diagnosis not present

## 2015-08-12 DIAGNOSIS — Z79899 Other long term (current) drug therapy: Secondary | ICD-10-CM | POA: Diagnosis not present

## 2015-08-12 LAB — COMPREHENSIVE METABOLIC PANEL
ALT: 28 U/L (ref 17–63)
ANION GAP: 9 (ref 5–15)
AST: 30 U/L (ref 15–41)
Albumin: 4 g/dL (ref 3.5–5.0)
Alkaline Phosphatase: 74 U/L (ref 38–126)
BILIRUBIN TOTAL: 0.5 mg/dL (ref 0.3–1.2)
BUN: 15 mg/dL (ref 6–20)
CHLORIDE: 104 mmol/L (ref 101–111)
CO2: 23 mmol/L (ref 22–32)
Calcium: 9.3 mg/dL (ref 8.9–10.3)
Creatinine, Ser: 0.85 mg/dL (ref 0.61–1.24)
Glucose, Bld: 215 mg/dL — ABNORMAL HIGH (ref 65–99)
POTASSIUM: 4 mmol/L (ref 3.5–5.1)
Sodium: 136 mmol/L (ref 135–145)
TOTAL PROTEIN: 6.7 g/dL (ref 6.5–8.1)

## 2015-08-12 LAB — CBC WITH DIFFERENTIAL/PLATELET
Basophils Absolute: 0 10*3/uL (ref 0.0–0.1)
Basophils Relative: 0 %
EOS PCT: 2 %
Eosinophils Absolute: 0.2 10*3/uL (ref 0.0–0.7)
HEMATOCRIT: 40.5 % (ref 39.0–52.0)
Hemoglobin: 13.5 g/dL (ref 13.0–17.0)
LYMPHS PCT: 17 %
Lymphs Abs: 1.3 10*3/uL (ref 0.7–4.0)
MCH: 31.1 pg (ref 26.0–34.0)
MCHC: 33.3 g/dL (ref 30.0–36.0)
MCV: 93.3 fL (ref 78.0–100.0)
MONO ABS: 0.8 10*3/uL (ref 0.1–1.0)
MONOS PCT: 10 %
NEUTROS ABS: 5.2 10*3/uL (ref 1.7–7.7)
Neutrophils Relative %: 71 %
PLATELETS: 152 10*3/uL (ref 150–400)
RBC: 4.34 MIL/uL (ref 4.22–5.81)
RDW: 12.6 % (ref 11.5–15.5)
WBC: 7.4 10*3/uL (ref 4.0–10.5)

## 2015-08-12 LAB — TROPONIN I

## 2015-08-12 MED ORDER — LORAZEPAM 0.5 MG PO TABS
0.5000 mg | ORAL_TABLET | Freq: Once | ORAL | Status: AC
  Administered 2015-08-12: 0.5 mg via ORAL
  Filled 2015-08-12: qty 1

## 2015-08-12 NOTE — Discharge Instructions (Signed)
Follow up with your md as needed °

## 2015-08-12 NOTE — ED Provider Notes (Signed)
CSN: [id number]     Arrival date & time 08/12/15  1240 History  By signing my name below, I, Jolayne Panther, attest that this documentation has been prepared under the direction and in the presence of Milton Ferguson, MD. Electronically Signed: Jolayne Panther, Scribe. 08/12/2015. 12:56 PM.   Chief Complaint  Patient presents with  . Chest Pain   Patient is a 82 y.o. male presenting with chest pain. The history is provided by the patient (pt complains of chest pain). No language interpreter was used.  Chest Pain Pain location:  Substernal area Pain radiates to:  Does not radiate Pain radiates to the back: no   Pain severity:  Mild Onset quality:  Sudden Duration:  1 day Timing:  Constant Progression:  Resolved Chronicity:  New Relieved by:  Nothing Worsened by:  Nothing tried Ineffective treatments: pepto bismol. Associated symptoms: no abdominal pain, no back pain, no cough, no fatigue and no headache    HPI Comments: Jeffrey [name] is a 82 y.o. male who presents to the Emergency Department complaining of sudden onset, mild, central chest pain which began this morning but has resolved since arrival to the ED. Pt also notes concern about his BP and states that he measured it at 190/90 this morning. Pt's BP in the ED is currently 132/68. He reports that he took pepto bismol this morning as well.   Past Medical History  Diagnosis Date  . Diabetes mellitus, type II ([place])   . Hyperlipidemia   . Coronary atherosclerosis of native coronary artery     a. CABG x 4 in 1989 (VG->OM1->OM2, VG->RCA, LIMA->LAD), b. 05/2010: DES to VG-OM1/OM2, DES to distal LCx. c. NSTEMI in 04/2011 - TO distal LCX stent and VG->OM2. d. 02/2012 NSTEMI DES to VG-OM1/continuation to OM2 occluded. e. inferior STEMI s/p DES to SVG-RAMUS 06/2012. f. inferolat STEMI 09/2012 s/p DES to SVG-interm; g. Lex MV (11/14):  EF 35%, inf-lat scar with small peri-infarct ischemia  . Essential hypertension, benign   .  Osteoarthritis   . History of stroke   . History of pneumonia   . Cervical vertebral fracture ([place])   . Chronic back pain   . Benign prostatic hypertrophy     History of urinary retention  . Peptic ulcer disease   . Gastroesophageal reflux disease   . Ischemic cardiomyopathy Nov 2015    EF 35% cath, 45-50% by echo  . Hypothyroidism   . Anxiety about health     "multiple somatic complaints"  . Chronic chest pain    Past Surgical History  Procedure Laterality Date  . Tonsillectomy    . Coronary artery bypass graft  1989  . Coronary angioplasty  10/12, 8/13, 12/13, 3/14    SVG-OM PCI  . Cardiac catheterization  05/19/14    SVG-OM occl- medical Rx  . Left heart catheterization with coronary angiogram N/A 11/01/2011    Procedure: LEFT HEART CATHETERIZATION WITH CORONARY ANGIOGRAM;  Surgeon: Lorretta Harp, MD;  Location: [facility] CATH LAB;  Service: Cardiovascular;  Laterality: N/A;  . Percutaneous coronary stent intervention (pci-s) N/A 11/01/2011    Procedure: PERCUTANEOUS CORONARY STENT INTERVENTION (PCI-S);  Surgeon: Lorretta Harp, MD;  Location: [facility] CATH LAB;  Service: Cardiovascular;  Laterality: N/A;  . Left heart catheterization with coronary/graft angiogram N/A 03/10/2012    Procedure: LEFT HEART CATHETERIZATION WITH [name];  Surgeon: Sherren Mocha, MD;  Location: [facility] CATH LAB;  Service: Cardiovascular;  Laterality: N/A;  . Left heart catheterization with coronary  angiogram N/A 06/18/2012    Procedure: LEFT HEART CATHETERIZATION WITH CORONARY ANGIOGRAM;  Surgeon: Peter M Martinique, MD;  Location: [facility] CATH LAB;  Service: Cardiovascular;  Laterality: N/A;  . Percutaneous coronary stent intervention (pci-s)  06/18/2012    Procedure: PERCUTANEOUS CORONARY STENT INTERVENTION (PCI-S);  Surgeon: Peter M Martinique, MD;  Location: [facility] CATH LAB;  Service: Cardiovascular;;  . Left heart catheterization with coronary/graft angiogram  10/06/2012    Procedure: LEFT HEART CATHETERIZATION  WITH [name];  Surgeon: Burnell Blanks, MD;  Location: [facility] CATH LAB;  Service: Cardiovascular;;  . Percutaneous coronary stent intervention (pci-s)  10/06/2012    Procedure: PERCUTANEOUS CORONARY STENT INTERVENTION (PCI-S);  Surgeon: Burnell Blanks, MD;  Location: [facility] CATH LAB;  Service: Cardiovascular;;  . Left heart catheterization with coronary/graft angiogram N/A 11/09/2012    Procedure: LEFT HEART CATHETERIZATION WITH [name];  Surgeon: Peter M Martinique, MD;  Location: [facility] CATH LAB;  Service: Cardiovascular;  Laterality: N/A;  . Left heart catheterization with coronary/graft angiogram N/A 05/19/2014    Procedure: LEFT HEART CATHETERIZATION WITH [name];  Surgeon: Troy Sine, MD;  Location: [facility] CATH LAB;  Service: Cardiovascular;  Laterality: N/A;   Family History  Problem Relation Age of Onset  . Early death      Parents died young  . Appendicitis Mother     Pt was 24 year old  . Heart attack Father 27   Social History  Substance Use Topics  . Smoking status: Former Smoker -- 2.00 packs/day for 10 years    Types: Cigarettes    Start date: 07/14/1950    Quit date: 07/14/1961  . Smokeless tobacco: Current User    Types: Chew  . Alcohol Use: No    Review of Systems  Constitutional: Negative for appetite change and fatigue.  HENT: Negative for congestion, ear discharge and sinus pressure.   Eyes: Negative for discharge.  Respiratory: Negative for cough.   Cardiovascular: Positive for chest pain.  Gastrointestinal: Negative for abdominal pain and diarrhea.  Genitourinary: Negative for frequency and hematuria.  Musculoskeletal: Negative for back pain.  Skin: Negative for rash.  Neurological: Negative for seizures and headaches.  Psychiatric/Behavioral: Negative for hallucinations.    Allergies  Review of patient's allergies indicates no known allergies.  Home Medications   Prior to Admission medications    Medication Sig Start Date End Date Taking? Authorizing Provider  acetaminophen (TYLENOL) 325 MG tablet Take 2 tablets (650 mg total) by mouth every 4 (four) hours as needed for headache or mild pain. Patient taking differently: Take 325 mg by mouth 2 (two) times daily as needed for headache or mild pain.  05/21/14   Erlene Quan, PA-C  albuterol (PROVENTIL HFA;VENTOLIN HFA) 108 (90 BASE) MCG/ACT inhaler Inhale 2 puffs into the lungs every 6 (six) hours as needed for wheezing or shortness of breath.    Historical Provider, MD  ALPRAZolam Duanne Moron) 0.25 MG tablet Take 0.25 mg by mouth 2 (two) times daily.    Historical Provider, MD  aspirin 81 MG tablet Take 81 mg by mouth daily.    Historical Provider, MD  atorvastatin (LIPITOR) 40 MG tablet Take 1 tablet (40 mg total) by mouth daily at 6 PM. 05/29/13   Liliane Shi, PA-C  bismuth subsalicylate (PEPTO BISMOL) 262 MG/15ML suspension Take 30 mLs by mouth every 6 (six) hours as needed for indigestion or diarrhea or loose stools.     Historical Provider, MD  carvedilol (COREG) 3.125 MG tablet TAKE  1 TABLET BY MOUTH TWICE DAILY WITH MEALS. 07/26/15   Lendon Colonel, NP  famotidine (PEPCID) 20 MG tablet Take 1 tablet (20 mg total) by mouth at bedtime. 03/03/15   Rexene Alberts, MD  furosemide (LASIX) 20 MG tablet Take 20 mg by mouth as needed for fluid. Reported on 06/29/2015 05/06/13   Lendon Colonel, NP  isosorbide mononitrate (IMDUR) 30 MG 24 hr tablet Take 1.5 tablets (45 mg total) by mouth daily. 03/03/15   Rexene Alberts, MD  levothyroxine (SYNTHROID, LEVOTHROID) 50 MCG tablet Take 50 mcg by mouth daily before breakfast.  09/08/14   Historical Provider, MD  loperamide (IMODIUM) 2 MG capsule Take 1 capsule (2 mg total) by mouth 4 (four) times daily as needed for diarrhea or loose stools. 04/01/15   Kristen N Ward, DO  metFORMIN (GLUCOPHAGE) 500 MG tablet Take 2 tablets (1,000 mg total) by mouth 2 (two) times daily with a meal. RESTART ON Sunday,  03/04/15. Patient taking differently: Take 500 mg by mouth 2 (two) times daily with a meal.  03/03/15   Rexene Alberts, MD  Multiple Vitamin (MULTIVITAMIN WITH MINERALS) TABS Take 1 tablet by mouth daily.    Historical Provider, MD  NITROSTAT 0.4 MG SL tablet PLACE 1 TAB UNDER TONGUE EVERY 5 MIN IF NEEDED FOR CHEST PAIN. MAY USE 3 TIMES.NO RELIEF CALL 911. 07/04/15   Lendon Colonel, NP  tamsulosin (FLOMAX) 0.4 MG CAPS Take 0.4 mg by mouth 2 (two) times daily.  01/27/13   Historical Provider, MD  ticagrelor (BRILINTA) 60 MG TABS tablet Take 1 tablet (60 mg total) by mouth 2 (two) times daily. Patient taking differently: Take 90 mg by mouth 2 (two) times daily.  08/08/15   Herminio Commons, MD   There were no vitals taken for this visit. Physical Exam  Constitutional: He is oriented to person, place, and time. He appears well-developed.  HENT:  Head: Normocephalic.  Eyes: Conjunctivae and EOM are normal. No scleral icterus.  Neck: Neck supple. No thyromegaly present.  Cardiovascular: Normal rate and regular rhythm.  Exam reveals no gallop and no friction rub.   No murmur heard. Pulmonary/Chest: No stridor. He has no wheezes. He has no rales. He exhibits no tenderness.  Abdominal: He exhibits no distension. There is no tenderness. There is no rebound.  Musculoskeletal: Normal range of motion. He exhibits no edema.  Lymphadenopathy:    He has no cervical adenopathy.  Neurological: He is oriented to person, place, and time. He exhibits normal muscle tone. Coordination normal.  Skin: No rash noted. No erythema.  Psychiatric: He has a normal mood and affect. His behavior is normal.    ED Course  Procedures  DIAGNOSTIC STUDIES:    Oxygen Saturation is 100% on RA, normal by my interpretation.   COORDINATION OF CARE:  12:49 PM Will administer pt medication for his nerves in the ED. Will review labs and imaging. Discussed treatment plan with pt at bedside and pt agreed to plan.   Labs  Review Labs Reviewed  TROPONIN I  CBC WITH DIFFERENTIAL/PLATELET  COMPREHENSIVE METABOLIC PANEL  I-STAT TROPOININ, ED    Imaging Review No results found. I have personally reviewed and evaluated these images and lab results as part of my medical decision-making.   EKG Interpretation   Date/Time:  Sunday August 12 2015 12:47:16 EST Ventricular Rate:  85 PR Interval:  196 QRS Duration: 105 QT Interval:  398 QTC Calculation: 473 R Axis:   92 Text Interpretation:  Sinus rhythm Right axis deviation Nonspecific repol  abnormality, diffuse leads Confirmed by Arlean Thies  MD, Martha Ellerby [phone]) on  08/12/2015 12:59:52 PM      MDM   Final diagnoses:  None    Patient anxiety and chest pain. Patient had no pain in emergency department he had 2 troponins negative. Anxiety was improved with some Ativan. Suspect chest pain symptoms related to anxiety. Patient will continue his Xanax and follow-up with his PCP   The chart was scribed for me under my direct supervision.  I personally performed the history, physical, and medical decision making and all procedures in the evaluation of this patient.Milton Ferguson, MD 08/12/15 [phone]

## 2015-08-12 NOTE — ED Notes (Signed)
Pt c/o central area chest pain that started this morning, unsure of time. No pain currently.

## 2015-08-20 ENCOUNTER — Encounter (HOSPITAL_COMMUNITY): Payer: Self-pay | Admitting: Emergency Medicine

## 2015-08-20 ENCOUNTER — Emergency Department (HOSPITAL_COMMUNITY)
Admission: EM | Admit: 2015-08-20 | Discharge: 2015-08-20 | Disposition: A | Discharge status: Home / Self Care | Payer: Medicare Other | Attending: Emergency Medicine | Admitting: Emergency Medicine

## 2015-08-20 DIAGNOSIS — Z8781 Personal history of (healed) traumatic fracture: Secondary | ICD-10-CM | POA: Insufficient documentation

## 2015-08-20 DIAGNOSIS — E039 Hypothyroidism, unspecified: Secondary | ICD-10-CM | POA: Insufficient documentation

## 2015-08-20 DIAGNOSIS — Z7982 Long term (current) use of aspirin: Secondary | ICD-10-CM | POA: Diagnosis not present

## 2015-08-20 DIAGNOSIS — Z9861 Coronary angioplasty status: Secondary | ICD-10-CM | POA: Insufficient documentation

## 2015-08-20 DIAGNOSIS — I209 Angina pectoris, unspecified: Secondary | ICD-10-CM

## 2015-08-20 DIAGNOSIS — R079 Chest pain, unspecified: Secondary | ICD-10-CM | POA: Diagnosis present

## 2015-08-20 DIAGNOSIS — E119 Type 2 diabetes mellitus without complications: Secondary | ICD-10-CM | POA: Insufficient documentation

## 2015-08-20 DIAGNOSIS — I10 Essential (primary) hypertension: Secondary | ICD-10-CM | POA: Diagnosis not present

## 2015-08-20 DIAGNOSIS — M1990 Unspecified osteoarthritis, unspecified site: Secondary | ICD-10-CM | POA: Diagnosis not present

## 2015-08-20 DIAGNOSIS — E785 Hyperlipidemia, unspecified: Secondary | ICD-10-CM | POA: Insufficient documentation

## 2015-08-20 DIAGNOSIS — Z8711 Personal history of peptic ulcer disease: Secondary | ICD-10-CM | POA: Insufficient documentation

## 2015-08-20 DIAGNOSIS — Z8719 Personal history of other diseases of the digestive system: Secondary | ICD-10-CM | POA: Insufficient documentation

## 2015-08-20 DIAGNOSIS — G8929 Other chronic pain: Secondary | ICD-10-CM | POA: Insufficient documentation

## 2015-08-20 DIAGNOSIS — Z8701 Personal history of pneumonia (recurrent): Secondary | ICD-10-CM | POA: Insufficient documentation

## 2015-08-20 DIAGNOSIS — Z9889 Other specified postprocedural states: Secondary | ICD-10-CM | POA: Insufficient documentation

## 2015-08-20 DIAGNOSIS — I25119 Atherosclerotic heart disease of native coronary artery with unspecified angina pectoris: Secondary | ICD-10-CM | POA: Insufficient documentation

## 2015-08-20 DIAGNOSIS — Z951 Presence of aortocoronary bypass graft: Secondary | ICD-10-CM | POA: Insufficient documentation

## 2015-08-20 DIAGNOSIS — Z7984 Long term (current) use of oral hypoglycemic drugs: Secondary | ICD-10-CM | POA: Diagnosis not present

## 2015-08-20 DIAGNOSIS — Z87891 Personal history of nicotine dependence: Secondary | ICD-10-CM | POA: Diagnosis not present

## 2015-08-20 DIAGNOSIS — N400 Enlarged prostate without lower urinary tract symptoms: Secondary | ICD-10-CM | POA: Insufficient documentation

## 2015-08-20 DIAGNOSIS — Z8673 Personal history of transient ischemic attack (TIA), and cerebral infarction without residual deficits: Secondary | ICD-10-CM | POA: Insufficient documentation

## 2015-08-20 LAB — BASIC METABOLIC PANEL
ANION GAP: 9 (ref 5–15)
BUN: 10 mg/dL (ref 6–20)
CO2: 24 mmol/L (ref 22–32)
Calcium: 9 mg/dL (ref 8.9–10.3)
Chloride: 103 mmol/L (ref 101–111)
Creatinine, Ser: 0.86 mg/dL (ref 0.61–1.24)
GFR calc Af Amer: >60 mL/min (ref >60)
Glucose, Bld: 251 mg/dL — ABNORMAL HIGH (ref 65–99)
POTASSIUM: 4.2 mmol/L (ref 3.5–5.1)
SODIUM: 136 mmol/L (ref 135–145)

## 2015-08-20 LAB — CBC WITH DIFFERENTIAL/PLATELET
BASOS ABS: 0 10*3/uL (ref 0.0–0.1)
Basophils Relative: 0 %
EOS PCT: 3 %
Eosinophils Absolute: 0.2 10*3/uL (ref 0.0–0.7)
HCT: 38.5 % — ABNORMAL LOW (ref 39.0–52.0)
Hemoglobin: 12.9 g/dL — ABNORMAL LOW (ref 13.0–17.0)
LYMPHS PCT: 15 %
Lymphs Abs: 1.1 10*3/uL (ref 0.7–4.0)
MCH: 31.5 pg (ref 26.0–34.0)
MCHC: 33.5 g/dL (ref 30.0–36.0)
MCV: 93.9 fL (ref 78.0–100.0)
Monocytes Absolute: 0.9 10*3/uL (ref 0.1–1.0)
Monocytes Relative: 12 %
Neutro Abs: 5 10*3/uL (ref 1.7–7.7)
Neutrophils Relative %: 70 %
PLATELETS: 146 10*3/uL — AB (ref 150–400)
RBC: 4.1 MIL/uL — AB (ref 4.22–5.81)
RDW: 12.8 % (ref 11.5–15.5)
WBC: 7.3 10*3/uL (ref 4.0–10.5)

## 2015-08-20 LAB — TROPONIN I: TROPONIN I: 0.03 ng/mL (ref <0.031)

## 2015-08-20 MED ORDER — RANOLAZINE ER 500 MG PO TB12: 500.0000 mg | ORAL_TABLET | Freq: Two times a day (BID) | ORAL | Status: DC

## 2015-08-20 MED ORDER — RANOLAZINE ER 500 MG PO TB12
500.0000 mg | ORAL_TABLET | Freq: Once | ORAL | Status: AC
  Administered 2015-08-20: 500 mg via ORAL
  Filled 2015-08-20: qty 1

## 2015-08-20 NOTE — Discharge Instructions (Signed)
Angina Pectoris Take the medication prescribed to you starting tonight. Call Dr. Court Joy office today or tomorrow to schedule the next available appointment. Your blood sugar today was mildly elevated today at 251. Angina pectoris, often called angina, is extreme discomfort in the chest, neck, or arm. This is caused by a lack of blood in the middle and thickest layer of the heart wall (myocardium). There are four types of angina:  Stable angina. Stable angina usually occurs in episodes of predictable frequency and duration. It is usually brought on by physical activity, stress, or excitement. Stable angina usually lasts a few minutes and can often be relieved by a medicine that you place under your tongue. This medicine is called sublingual nitroglycerin.  Unstable angina. Unstable angina can occur even when you are doing little or no physical activity. It can even occur while you are sleeping or when you are at rest. It can suddenly increase in severity or frequency. It may not be relieved by sublingual nitroglycerin, and it can last up to 30 minutes.  Microvascular angina. This type of angina is caused by a disorder of tiny blood vessels called arterioles. Microvascular angina is more common in women. The pain may be more severe and last longer than other types of angina pectoris.  Prinzmetal or variant angina. This type of angina pectoris is rare and usually occurs when you are doing little or no physical activity. It especially occurs in the early morning hours. CAUSES Atherosclerosis is the cause of angina. This is the buildup of fat and cholesterol (plaque) on the inside of the arteries. Over time, the plaque may narrow or block the artery, and this will lessen blood flow to the heart. Plaque can also become weak and break off within a coronary artery to form a clot and cause a sudden blockage. RISK FACTORS Risk factors common to both men and women include:  High cholesterol  levels.  High blood pressure (hypertension).  Tobacco use.  Diabetes.  Family history of angina.  Obesity.  Lack of exercise.  A diet high in saturated fats. Women are at greater risk for angina if they are:  Over age 53.  Postmenopausal. SYMPTOMS Many people do not experience any symptoms during the early stages of angina. As the condition progresses, symptoms common to both men and women may include:  Chest pain.  The pain can be described as a crushing or squeezing in the chest, or a tightness, pressure, fullness, or heaviness in the chest.  The pain can last more than a few minutes, or it can stop and recur.  Pain in the arms, neck, jaw, or back.  Unexplained heartburn or indigestion.  Shortness of breath.  Nausea.  Sudden cold sweats.  Sudden light-headedness. Many women have chest discomfort and some of the other symptoms. However, women often have different (atypical) symptoms, such as:   Fatigue.  Unexplained feelings of nervousness or anxiety.  Unexplained weakness.  Dizziness or fainting. Sometimes, women may have angina without any symptoms. DIAGNOSIS  Tests to diagnose angina may include:  ECG (electrocardiogram).  Exercise stress test. This looks for signs of blockage when the heart is being exercised.  Pharmacologic stress test. This test looks for signs of blockage when the heart is being stressed with a medicine.  Blood tests.  Coronary angiogram. This is a procedure to look at the coronary arteries to see if there is any blockage. TREATMENT  The treatment of angina may include the following:  Healthy behavioral changes to  reduce or control risk factors.  Medicine.  Coronary stenting.A stent helps to keep an artery open.  Coronary angioplasty. This procedure widens a narrowed or blocked artery.  Coronary arterybypass surgery. This will allow your blood to pass the blockage (bypass) to reach your heart. HOME CARE INSTRUCTIONS    Take medicines only as directed by your health care provider.  Do not take the following medicines unless your health care provider approves:  Nonsteroidal anti-inflammatory drugs (NSAIDs), such as ibuprofen, naproxen, or celecoxib.  Vitamin supplements that contain vitamin A, vitamin E, or both.  Hormone replacement therapy that contains estrogen with or without progestin.  Manage other health conditions such as hypertension and diabetes as directed by your health care provider.  Follow a heart-healthy diet. A dietitian can help to educate you about healthy food options and changes.  Use healthy cooking methods such as roasting, grilling, broiling, baking, poaching, steaming, or stir-frying. Talk to a dietitian to learn more about healthy cooking methods.  Follow an exercise program approved by your health care provider.  Maintain a healthy weight. Lose weight as approved by your health care provider.  Plan rest periods when fatigued.  Learn to manage stress.  Do not use any tobacco products, including cigarettes, chewing tobacco, or electronic cigarettes. If you need help quitting, ask your health care provider.  If you drink alcohol, and your health care provider approves, limit your alcohol intake to no more than 1 drink per day. One drink equals 12 ounces of beer, 5 ounces of wine, or 1 ounces of hard liquor.  Stop illegal drug use.  Keep all follow-up visits as directed by your health care provider. This is important. SEEK IMMEDIATE MEDICAL CARE IF:   You have pain in your chest, neck, arm, jaw, stomach, or back that lasts more than a few minutes, is recurring, or is unrelieved by taking sublingualnitroglycerin.  You have profuse sweating without cause.  You have unexplained:  Heartburn or indigestion.  Shortness of breath or difficulty breathing.  Nausea or vomiting.  Fatigue.  Feelings of nervousness or anxiety.  Weakness.  Diarrhea.  You have sudden  light-headedness or dizziness.  You faint. These symptoms may represent a serious problem that is an emergency. Do not wait to see if the symptoms will go away. Get medical help right away. Call your local emergency services (911 in the [place] Do not drive yourself to the hospital.   This information is not intended to replace advice given to you by your health care provider. Make sure you discuss any questions you have with your health care provider.   Document Released: 06/30/2005 Document Revised: 07/21/2014 Document Reviewed: 11/01/2013 Elsevier Interactive Patient Education [employer].

## 2015-08-20 NOTE — ED Notes (Signed)
Pt given lunch tray.

## 2015-08-20 NOTE — ED Notes (Signed)
Pt reports onset of squeezing chest pain that started last night.

## 2015-08-20 NOTE — ED Notes (Signed)
Pt continues to deny pain and states that he needs a sandwich.

## 2015-08-20 NOTE — ED Provider Notes (Signed)
CSN: [id number]     Arrival date & time 08/20/15  1048 History  By signing my name below, I, Hilda Lias, attest that this documentation has been prepared under the direction and in the presence of Orlie Dakin, MD. Electronically Signed: Hilda Lias, ED Scribe. 08/20/2015. 11:49 AM.   Chief Complaint  Patient presents with  . Chest Pain     Patient is a 82 y.o. male presenting with chest pain. The history is provided by the patient. No language interpreter was used.  Chest Pain Pain location:  Substernal area Pain quality comment:  Squeezing Pain radiates to:  Does not radiate Pain radiates to the back: no   Pain severity:  Moderate Onset quality:  Gradual Duration:  12 hours Timing:  Intermittent Chronicity:  Chronic Context: at rest   Relieved by:  Nothing Ineffective treatments:  Aspirin Risk factors: diabetes mellitus, high cholesterol, hypertension, male sex, prior DVT/PE and surgery    HPI Comments: TREVYON DOUD is a 82 y.o. male with a PMHx of DM type 2, HLD, HTN, stroke, pneumonia, GERD, Ischemic cardiomyopathy, chronic chest pain and PSHx of coronary artery bypass graft, cardiac catheterization, coronary angioplasty who presents to the Emergency Department complaining of intermittent, "squeezing" central chest pain that began last night. Pt reports he is not currently in any pain, as his chest pain went away when he came to ED. Pt states his pain woke him up in the middle of the night last night and reports it usually lasts a few seconds at a time.longest pain lasted 3 or 4 minutes Pt. Pt states he has chest pain every day, and reports today it feels more like indigestion. Pt states sometimes his pains are indigestion, and sometimes they are legitimately for his chest pain. Pt reports a hx of open heart surgery. Pt states he takes one adult Aspirin every day, and states he took one this morning. Pt denies being allergic to any medications. Pt states he chews tobacco, but  denies drinking alcohol. Pt reports his Cardiologist is Dr. Purcell Nails.    Past Medical History  Diagnosis Date  . Diabetes mellitus, type II ([place])   . Hyperlipidemia   . Coronary atherosclerosis of native coronary artery     a. CABG x 4 in 1989 (VG->OM1->OM2, VG->RCA, LIMA->LAD), b. 05/2010: DES to VG-OM1/OM2, DES to distal LCx. c. NSTEMI in 04/2011 - TO distal LCX stent and VG->OM2. d. 02/2012 NSTEMI DES to VG-OM1/continuation to OM2 occluded. e. inferior STEMI s/p DES to SVG-RAMUS 06/2012. f. inferolat STEMI 09/2012 s/p DES to SVG-interm; g. Lex MV (11/14):  EF 35%, inf-lat scar with small peri-infarct ischemia  . Essential hypertension, benign   . Osteoarthritis   . History of stroke   . History of pneumonia   . Cervical vertebral fracture ([place])   . Chronic back pain   . Benign prostatic hypertrophy     History of urinary retention  . Peptic ulcer disease   . Gastroesophageal reflux disease   . Ischemic cardiomyopathy Nov 2015    EF 35% cath, 45-50% by echo  . Hypothyroidism   . Anxiety about health     "multiple somatic complaints"  . Chronic chest pain    Past Surgical History  Procedure Laterality Date  . Tonsillectomy    . Coronary artery bypass graft  1989  . Coronary angioplasty  10/12, 8/13, 12/13, 3/14    SVG-OM PCI  . Cardiac catheterization  05/19/14    SVG-OM occl- medical Rx  . Left  heart catheterization with coronary angiogram N/A 11/01/2011    Procedure: LEFT HEART CATHETERIZATION WITH CORONARY ANGIOGRAM;  Surgeon: Lorretta Harp, MD;  Location: [facility] CATH LAB;  Service: Cardiovascular;  Laterality: N/A;  . Percutaneous coronary stent intervention (pci-s) N/A 11/01/2011    Procedure: PERCUTANEOUS CORONARY STENT INTERVENTION (PCI-S);  Surgeon: Lorretta Harp, MD;  Location: [facility] CATH LAB;  Service: Cardiovascular;  Laterality: N/A;  . Left heart catheterization with coronary/graft angiogram N/A 03/10/2012    Procedure: LEFT HEART CATHETERIZATION WITH [name];  Surgeon: Sherren Mocha, MD;  Location: [facility] CATH LAB;  Service: Cardiovascular;  Laterality: N/A;  . Left heart catheterization with coronary angiogram N/A 06/18/2012    Procedure: LEFT HEART CATHETERIZATION WITH CORONARY ANGIOGRAM;  Surgeon: Peter M Martinique, MD;  Location: [facility] CATH LAB;  Service: Cardiovascular;  Laterality: N/A;  . Percutaneous coronary stent intervention (pci-s)  06/18/2012    Procedure: PERCUTANEOUS CORONARY STENT INTERVENTION (PCI-S);  Surgeon: Peter M Martinique, MD;  Location: [facility] CATH LAB;  Service: Cardiovascular;;  . Left heart catheterization with coronary/graft angiogram  10/06/2012    Procedure: LEFT HEART CATHETERIZATION WITH [name];  Surgeon: Burnell Blanks, MD;  Location: [facility] CATH LAB;  Service: Cardiovascular;;  . Percutaneous coronary stent intervention (pci-s)  10/06/2012    Procedure: PERCUTANEOUS CORONARY STENT INTERVENTION (PCI-S);  Surgeon: Burnell Blanks, MD;  Location: [facility] CATH LAB;  Service: Cardiovascular;;  . Left heart catheterization with coronary/graft angiogram N/A 11/09/2012    Procedure: LEFT HEART CATHETERIZATION WITH [name];  Surgeon: Peter M Martinique, MD;  Location: [facility] CATH LAB;  Service: Cardiovascular;  Laterality: N/A;  . Left heart catheterization with coronary/graft angiogram N/A 05/19/2014    Procedure: LEFT HEART CATHETERIZATION WITH [name];  Surgeon: Troy Sine, MD;  Location: [facility] CATH LAB;  Service: Cardiovascular;  Laterality: N/A;   Family History  Problem Relation Age of Onset  . Early death      Parents died young  . Appendicitis Mother     Pt was 40 year old  . Heart attack Father 26   Social History  Substance Use Topics  . Smoking status: Former Smoker -- 2.00 packs/day for 10 years    Types: Cigarettes    Start date: 07/14/1950    Quit date: 07/14/1961  . Smokeless tobacco: Current User    Types: Chew  . Alcohol Use: No    Review of Systems   Constitutional: Negative.   HENT: Negative.   Respiratory: Negative.   Cardiovascular: Positive for chest pain.  Gastrointestinal: Negative.   Musculoskeletal: Negative.   Skin: Negative.   Allergic/Immunologic: Positive for immunocompromised state.       Diabetic  Neurological: Negative.   Psychiatric/Behavioral: Negative.   All other systems reviewed and are negative.     Allergies  Review of patient's allergies indicates no known allergies.  Home Medications   Prior to Admission medications   Medication Sig Start Date End Date Taking? Authorizing Provider  acetaminophen (TYLENOL) 325 MG tablet Take 2 tablets (650 mg total) by mouth every 4 (four) hours as needed for headache or mild pain. Patient taking differently: Take 325 mg by mouth 2 (two) times daily as needed for headache or mild pain.  05/21/14   Erlene Quan, PA-C  albuterol (PROVENTIL HFA;VENTOLIN HFA) 108 (90 BASE) MCG/ACT inhaler Inhale 2 puffs into the lungs every 6 (six) hours as needed for wheezing or shortness of breath.    Historical Provider, MD  ALPRAZolam Duanne Moron) 0.25 MG  tablet Take 0.25 mg by mouth 2 (two) times daily.    Historical Provider, MD  aspirin 81 MG tablet Take 81 mg by mouth daily.    Historical Provider, MD  atorvastatin (LIPITOR) 40 MG tablet Take 1 tablet (40 mg total) by mouth daily at 6 PM. 05/29/13   Liliane Shi, PA-C  bismuth subsalicylate (PEPTO BISMOL) 262 MG/15ML suspension Take 30 mLs by mouth every 6 (six) hours as needed for indigestion or diarrhea or loose stools.     Historical Provider, MD  carvedilol (COREG) 3.125 MG tablet TAKE 1 TABLET BY MOUTH TWICE DAILY WITH MEALS. 07/26/15   Lendon Colonel, NP  famotidine (PEPCID) 20 MG tablet Take 1 tablet (20 mg total) by mouth at bedtime. 03/03/15   Rexene Alberts, MD  furosemide (LASIX) 20 MG tablet Take 20 mg by mouth as needed for fluid. Reported on 06/29/2015 05/06/13   Lendon Colonel, NP  isosorbide mononitrate (IMDUR) 30 MG  24 hr tablet Take 1.5 tablets (45 mg total) by mouth daily. 03/03/15   Rexene Alberts, MD  levothyroxine (SYNTHROID, LEVOTHROID) 50 MCG tablet Take 50 mcg by mouth daily before breakfast.  09/08/14   Historical Provider, MD  loperamide (IMODIUM) 2 MG capsule Take 1 capsule (2 mg total) by mouth 4 (four) times daily as needed for diarrhea or loose stools. 04/01/15   Kristen N Ward, DO  metFORMIN (GLUCOPHAGE) 500 MG tablet Take 2 tablets (1,000 mg total) by mouth 2 (two) times daily with a meal. RESTART ON Sunday, 03/04/15. Patient taking differently: Take 500 mg by mouth 2 (two) times daily with a meal.  03/03/15   Rexene Alberts, MD  Multiple Vitamin (MULTIVITAMIN WITH MINERALS) TABS Take 1 tablet by mouth daily.    Historical Provider, MD  NITROSTAT 0.4 MG SL tablet PLACE 1 TAB UNDER TONGUE EVERY 5 MIN IF NEEDED FOR CHEST PAIN. MAY USE 3 TIMES.NO RELIEF CALL 911. 07/04/15   Lendon Colonel, NP  tamsulosin (FLOMAX) 0.4 MG CAPS Take 0.4 mg by mouth 2 (two) times daily.  01/27/13   Historical Provider, MD  ticagrelor (BRILINTA) 60 MG TABS tablet Take 1 tablet (60 mg total) by mouth 2 (two) times daily. Patient taking differently: Take 90 mg by mouth 2 (two) times daily.  08/08/15   Herminio Commons, MD   BP 138/72 mmHg  Pulse 94  Temp(Src) 98.2 F (36.8 C) (Oral)  Resp 19  Ht 5\' 10"  (1.778 m)  Wt 170 lb (77.111 kg)  BMI 24.39 kg/m2  SpO2 96% Physical Exam  Constitutional: He appears well-developed and well-nourished.  HENT:  Head: Normocephalic and atraumatic.  Eyes: Conjunctivae are normal. Pupils are equal, round, and reactive to light.  Neck: Neck supple. No tracheal deviation present. No thyromegaly present.  Cardiovascular: Normal rate and regular rhythm.   No murmur heard. Pulmonary/Chest: Effort normal and breath sounds normal.  Abdominal: Soft. Bowel sounds are normal. He exhibits no distension. There is no tenderness.  Musculoskeletal: Normal range of motion. He exhibits no edema or  tenderness.  Neurological: He is alert. Coordination normal.  Skin: Skin is warm and dry. No rash noted.  Psychiatric: He has a normal mood and affect.  Nursing note and vitals reviewed.   ED Course  Procedures (including critical care time)  DIAGNOSTIC STUDIES: Oxygen Saturation is 96% on room air, normal by my interpretation.    COORDINATION OF CARE: 11:46 AM Discussed treatment plan with pt at bedside and pt agreed to plan.  Labs Review Labs Reviewed - No data to display  Imaging Review No results found. I have personally reviewed and evaluated these images and lab results as part of my medical decision-making.   EKG Interpretation   Date/Time:  Monday August 20 2015 10:56:03 EST Ventricular Rate:  86 PR Interval:  189 QRS Duration: 104 QT Interval:  388 QTC Calculation: 464 R Axis:   91 Text Interpretation:  Sinus rhythm Right axis deviation Nonspecific repol  abnormality, diffuse leads No significant change since last tracing  Confirmed by Tianah Lonardo  MD, Han Lysne ([id number]) on 08/20/2015 11:03:02 AM     3:45 PM patient remains asymptomatic. He feels ready to go home Results for orders placed or performed during the hospital encounter of 08/20/15  Troponin I  Result Value Ref Range   Troponin I <0.03 <0.031 ng/mL  Basic metabolic panel  Result Value Ref Range   Sodium 136 135 - 145 mmol/L   Potassium 4.2 3.5 - 5.1 mmol/L   Chloride 103 101 - 111 mmol/L   CO2 24 22 - 32 mmol/L   Glucose, Bld 251 (H) 65 - 99 mg/dL   BUN 10 6 - 20 mg/dL   Creatinine, Ser 0.86 0.61 - 1.24 mg/dL   Calcium 9.0 8.9 - 10.3 mg/dL   GFR calc non Af Amer >60 >60 mL/min   GFR calc Af Amer >60 >60 mL/min   Anion gap 9 5 - 15  CBC with Differential/Platelet  Result Value Ref Range   WBC 7.3 4.0 - 10.5 K/uL   RBC 4.10 (L) 4.22 - 5.81 MIL/uL   Hemoglobin 12.9 (L) 13.0 - 17.0 g/dL   HCT 38.5 (L) 39.0 - 52.0 %   MCV 93.9 78.0 - 100.0 fL   MCH 31.5 26.0 - 34.0 pg   MCHC 33.5 30.0 - 36.0  g/dL   RDW 12.8 11.5 - 15.5 %   Platelets 146 (L) 150 - 400 K/uL   Neutrophils Relative % 70 %   Neutro Abs 5.0 1.7 - 7.7 K/uL   Lymphocytes Relative 15 %   Lymphs Abs 1.1 0.7 - 4.0 K/uL   Monocytes Relative 12 %   Monocytes Absolute 0.9 0.1 - 1.0 K/uL   Eosinophils Relative 3 %   Eosinophils Absolute 0.2 0.0 - 0.7 K/uL   Basophils Relative 0 %   Basophils Absolute 0.0 0.0 - 0.1 K/uL  Troponin I  Result Value Ref Range   Troponin I 0.03 <0.031 ng/mL   Dg Chest 2 View  08/12/2015  CLINICAL DATA:  Chest pain starting this morning. EXAM: CHEST  2 VIEW COMPARISON:  07/27/2015 FINDINGS: Prior CABG. Atherosclerotic aortic arch. Heart size within normal limits. Suspected heaving fractures of the left lateral eighth and ninth ribs, with cortical indistinctness. No pneumothorax or pleural effusion identified. Thoracic spondylosis. IMPRESSION: 1. Suspected healing fractures the left lateral eighth and ninth ribs. Correlate with any point tenderness. 2. Prior CABG.  No edema or cardiomegaly. Electronically Signed   By: Van Clines M.D.   On: 08/12/2015 13:53   Dg Chest Portable 1 View  07/27/2015  CLINICAL DATA:  Chest pain this morning. EXAM: PORTABLE CHEST 1 VIEW COMPARISON:  07/18/2015 FINDINGS: The heart is normal in size and stable. There is tortuosity and calcification of the thoracic aorta. Stable surgical changes from bypass surgery. The lungs are clear. No pleural effusion. The bony thorax is intact. IMPRESSION: No acute cardiopulmonary findings. Electronically Signed   By: Marijo Sanes M.D.   On: 07/27/2015 08:14  MDM  I spoke with Dr.Koneswaram via telephone who knows patient very well. Plan we will check serial troponins. If negative will add Ranexa 500 mg twice daily to his medication regimen. Patient is to call office for follow-up. Patient has similar chestpain daily.he's had 33 ED visits in the past 6 months. He is a difficult historian. Nonacute EKG and no change in pattern  of pain Final diagnoses:  None  diagnosis stable angina #2 hyperglycemia  I personally performed the services described in this documentation, which was scribed in my presence. The recorded information has been reviewed and considered.     Orlie Dakin, MD 08/20/15 [phone]

## 2015-08-20 NOTE — ED Notes (Signed)
Pt resting quietly in bed.  Denies pain at this time.

## 2015-08-24 ENCOUNTER — Encounter (HOSPITAL_COMMUNITY): Payer: Self-pay | Admitting: Cardiology

## 2015-08-24 ENCOUNTER — Emergency Department (HOSPITAL_COMMUNITY)
Admission: EM | Admit: 2015-08-24 | Discharge: 2015-08-24 | Disposition: A | Discharge status: Home / Self Care | Payer: Medicare Other | Attending: Emergency Medicine | Admitting: Emergency Medicine

## 2015-08-24 DIAGNOSIS — Z87891 Personal history of nicotine dependence: Secondary | ICD-10-CM | POA: Diagnosis not present

## 2015-08-24 DIAGNOSIS — R0602 Shortness of breath: Secondary | ICD-10-CM | POA: Diagnosis not present

## 2015-08-24 DIAGNOSIS — K219 Gastro-esophageal reflux disease without esophagitis: Secondary | ICD-10-CM | POA: Diagnosis not present

## 2015-08-24 DIAGNOSIS — Z7984 Long term (current) use of oral hypoglycemic drugs: Secondary | ICD-10-CM | POA: Diagnosis not present

## 2015-08-24 DIAGNOSIS — Z8701 Personal history of pneumonia (recurrent): Secondary | ICD-10-CM | POA: Insufficient documentation

## 2015-08-24 DIAGNOSIS — R079 Chest pain, unspecified: Secondary | ICD-10-CM | POA: Diagnosis not present

## 2015-08-24 DIAGNOSIS — Z79899 Other long term (current) drug therapy: Secondary | ICD-10-CM | POA: Diagnosis not present

## 2015-08-24 DIAGNOSIS — M1990 Unspecified osteoarthritis, unspecified site: Secondary | ICD-10-CM | POA: Insufficient documentation

## 2015-08-24 DIAGNOSIS — Z9889 Other specified postprocedural states: Secondary | ICD-10-CM | POA: Insufficient documentation

## 2015-08-24 DIAGNOSIS — N400 Enlarged prostate without lower urinary tract symptoms: Secondary | ICD-10-CM | POA: Diagnosis not present

## 2015-08-24 DIAGNOSIS — Z8673 Personal history of transient ischemic attack (TIA), and cerebral infarction without residual deficits: Secondary | ICD-10-CM | POA: Insufficient documentation

## 2015-08-24 DIAGNOSIS — E039 Hypothyroidism, unspecified: Secondary | ICD-10-CM | POA: Diagnosis not present

## 2015-08-24 DIAGNOSIS — I2510 Atherosclerotic heart disease of native coronary artery without angina pectoris: Secondary | ICD-10-CM | POA: Diagnosis not present

## 2015-08-24 DIAGNOSIS — Z9861 Coronary angioplasty status: Secondary | ICD-10-CM | POA: Insufficient documentation

## 2015-08-24 DIAGNOSIS — E119 Type 2 diabetes mellitus without complications: Secondary | ICD-10-CM | POA: Insufficient documentation

## 2015-08-24 DIAGNOSIS — G8929 Other chronic pain: Secondary | ICD-10-CM | POA: Insufficient documentation

## 2015-08-24 DIAGNOSIS — Z7982 Long term (current) use of aspirin: Secondary | ICD-10-CM | POA: Diagnosis not present

## 2015-08-24 DIAGNOSIS — Z951 Presence of aortocoronary bypass graft: Secondary | ICD-10-CM | POA: Insufficient documentation

## 2015-08-24 DIAGNOSIS — Z8781 Personal history of (healed) traumatic fracture: Secondary | ICD-10-CM | POA: Insufficient documentation

## 2015-08-24 DIAGNOSIS — Z8711 Personal history of peptic ulcer disease: Secondary | ICD-10-CM | POA: Insufficient documentation

## 2015-08-24 LAB — TROPONIN I: Troponin I: <0.03 ng/mL (ref <0.031)

## 2015-08-24 NOTE — ED Provider Notes (Signed)
CSN: [id number]     Arrival date & time 08/24/15  1023 History  By signing my name below, I, Terressa Koyanagi, attest that this documentation has been prepared under the direction and in the presence of Virgel Manifold, MD. Electronically Signed: Terressa Koyanagi, ED Scribe. 08/24/2015. 11:25 AM.  Chief Complaint  Patient presents with  . Chest Pain   The history is provided by the patient. No language interpreter was used.   PCP: [place] HPI Comments: Jeffrey [name] is a 82 y.o. male, with PMHx noted below including DMTII, hyperlipidemia, essential hypertension, BPH, ischemic cardiomyopathy, coronary artery disease with CABG 4 vessels in 1989 with 10 stents, coronary angioplasty and chronic chest pain, who presents to the Emergency Department complaining of  intermittent, squeezing, centralized chest pain with associated mild SOB onset this morning while pt was laying down. Pt reports taking ntg at home with relief. Pt denies any associated diaphoresis or lightheadedness. During exam pt denies any chest pain and reports his Sx have resolved.   Past Medical History  Diagnosis Date  . Diabetes mellitus, type II ([place])   . Hyperlipidemia   . Coronary atherosclerosis of native coronary artery     a. CABG x 4 in 1989 (VG->OM1->OM2, VG->RCA, LIMA->LAD), b. 05/2010: DES to VG-OM1/OM2, DES to distal LCx. c. NSTEMI in 04/2011 - TO distal LCX stent and VG->OM2. d. 02/2012 NSTEMI DES to VG-OM1/continuation to OM2 occluded. e. inferior STEMI s/p DES to SVG-RAMUS 06/2012. f. inferolat STEMI 09/2012 s/p DES to SVG-interm; g. Lex MV (11/14):  EF 35%, inf-lat scar with small peri-infarct ischemia  . Essential hypertension, benign   . Osteoarthritis   . History of stroke   . History of pneumonia   . Cervical vertebral fracture ([place])   . Chronic back pain   . Benign prostatic hypertrophy     History of urinary retention  . Peptic ulcer disease   . Gastroesophageal reflux disease   . Ischemic  cardiomyopathy Nov 2015    EF 35% cath, 45-50% by echo  . Hypothyroidism   . Anxiety about health     "multiple somatic complaints"  . Chronic chest pain    Past Surgical History  Procedure Laterality Date  . Tonsillectomy    . Coronary artery bypass graft  1989  . Coronary angioplasty  10/12, 8/13, 12/13, 3/14    SVG-OM PCI  . Cardiac catheterization  05/19/14    SVG-OM occl- medical Rx  . Left heart catheterization with coronary angiogram N/A 11/01/2011    Procedure: LEFT HEART CATHETERIZATION WITH CORONARY ANGIOGRAM;  Surgeon: Lorretta Harp, MD;  Location: [facility] CATH LAB;  Service: Cardiovascular;  Laterality: N/A;  . Percutaneous coronary stent intervention (pci-s) N/A 11/01/2011    Procedure: PERCUTANEOUS CORONARY STENT INTERVENTION (PCI-S);  Surgeon: Lorretta Harp, MD;  Location: [facility] CATH LAB;  Service: Cardiovascular;  Laterality: N/A;  . Left heart catheterization with coronary/graft angiogram N/A 03/10/2012    Procedure: LEFT HEART CATHETERIZATION WITH [name];  Surgeon: Sherren Mocha, MD;  Location: [facility] CATH LAB;  Service: Cardiovascular;  Laterality: N/A;  . Left heart catheterization with coronary angiogram N/A 06/18/2012    Procedure: LEFT HEART CATHETERIZATION WITH CORONARY ANGIOGRAM;  Surgeon: Peter M Martinique, MD;  Location: [facility] CATH LAB;  Service: Cardiovascular;  Laterality: N/A;  . Percutaneous coronary stent intervention (pci-s)  06/18/2012    Procedure: PERCUTANEOUS CORONARY STENT INTERVENTION (PCI-S);  Surgeon: Peter M Martinique, MD;  Location: [facility] CATH LAB;  Service: Cardiovascular;;  .  Left heart catheterization with coronary/graft angiogram  10/06/2012    Procedure: LEFT HEART CATHETERIZATION WITH [name];  Surgeon: Burnell Blanks, MD;  Location: [facility] CATH LAB;  Service: Cardiovascular;;  . Percutaneous coronary stent intervention (pci-s)  10/06/2012    Procedure: PERCUTANEOUS CORONARY STENT INTERVENTION (PCI-S);  Surgeon: Burnell Blanks, MD;  Location: [facility] CATH LAB;  Service: Cardiovascular;;  . Left heart catheterization with coronary/graft angiogram N/A 11/09/2012    Procedure: LEFT HEART CATHETERIZATION WITH [name];  Surgeon: Peter M Martinique, MD;  Location: [facility] CATH LAB;  Service: Cardiovascular;  Laterality: N/A;  . Left heart catheterization with coronary/graft angiogram N/A 05/19/2014    Procedure: LEFT HEART CATHETERIZATION WITH [name];  Surgeon: Troy Sine, MD;  Location: [facility] CATH LAB;  Service: Cardiovascular;  Laterality: N/A;   Family History  Problem Relation Age of Onset  . Early death      Parents died young  . Appendicitis Mother     Pt was 67 year old  . Heart attack Father 48   Social History  Substance Use Topics  . Smoking status: Former Smoker -- 2.00 packs/day for 10 years    Types: Cigarettes    Start date: 07/14/1950    Quit date: 07/14/1961  . Smokeless tobacco: Current User    Types: Chew  . Alcohol Use: No    Review of Systems  Constitutional: Negative for fever and diaphoresis.  Respiratory: Positive for shortness of breath.   Cardiovascular: Positive for chest pain.  Neurological: Negative for light-headedness.  All other systems reviewed and are negative.  Allergies  Review of patient's allergies indicates no known allergies.  Home Medications   Prior to Admission medications   Medication Sig Start Date End Date Taking? Authorizing Provider  acetaminophen (TYLENOL) 325 MG tablet Take 2 tablets (650 mg total) by mouth every 4 (four) hours as needed for headache or mild pain. Patient taking differently: Take 325 mg by mouth 2 (two) times daily as needed for headache or mild pain.  05/21/14   Erlene Quan, PA-C  albuterol (PROVENTIL HFA;VENTOLIN HFA) 108 (90 BASE) MCG/ACT inhaler Inhale 2 puffs into the lungs every 6 (six) hours as needed for wheezing or shortness of breath.    Historical Provider, MD  ALPRAZolam Duanne Moron) 0.25 MG tablet Take  0.25 mg by mouth 2 (two) times daily.    Historical Provider, MD  aspirin 81 MG tablet Take 81 mg by mouth daily.    Historical Provider, MD  atorvastatin (LIPITOR) 40 MG tablet Take 1 tablet (40 mg total) by mouth daily at 6 PM. 05/29/13   Liliane Shi, PA-C  bismuth subsalicylate (PEPTO BISMOL) 262 MG/15ML suspension Take 30 mLs by mouth every 6 (six) hours as needed for indigestion or diarrhea or loose stools.     Historical Provider, MD  carvedilol (COREG) 3.125 MG tablet TAKE 1 TABLET BY MOUTH TWICE DAILY WITH MEALS. 07/26/15   Lendon Colonel, NP  famotidine (PEPCID) 20 MG tablet Take 1 tablet (20 mg total) by mouth at bedtime. 03/03/15   Rexene Alberts, MD  furosemide (LASIX) 20 MG tablet Take 20 mg by mouth as needed for fluid. Reported on 06/29/2015 05/06/13   Lendon Colonel, NP  isosorbide mononitrate (IMDUR) 30 MG 24 hr tablet Take 1.5 tablets (45 mg total) by mouth daily. 03/03/15   Rexene Alberts, MD  levothyroxine (SYNTHROID, LEVOTHROID) 50 MCG tablet Take 50 mcg by mouth daily before breakfast.  09/08/14   Historical  Provider, MD  loperamide (IMODIUM) 2 MG capsule Take 1 capsule (2 mg total) by mouth 4 (four) times daily as needed for diarrhea or loose stools. 04/01/15   Kristen N Ward, DO  metFORMIN (GLUCOPHAGE) 500 MG tablet Take 2 tablets (1,000 mg total) by mouth 2 (two) times daily with a meal. RESTART ON Sunday, 03/04/15. Patient taking differently: Take 500 mg by mouth 2 (two) times daily with a meal.  03/03/15   Rexene Alberts, MD  Multiple Vitamin (MULTIVITAMIN WITH MINERALS) TABS Take 1 tablet by mouth daily.    Historical Provider, MD  NITROSTAT 0.4 MG SL tablet PLACE 1 TAB UNDER TONGUE EVERY 5 MIN IF NEEDED FOR CHEST PAIN. MAY USE 3 TIMES.NO RELIEF CALL 911. 07/04/15   Lendon Colonel, NP  ranolazine (RANEXA) 500 MG 12 hr tablet Take 1 tablet (500 mg total) by mouth 2 (two) times daily. 08/20/15   Orlie Dakin, MD  tamsulosin (FLOMAX) 0.4 MG CAPS Take 0.4 mg by mouth 2  (two) times daily.  01/27/13   Historical Provider, MD  ticagrelor (BRILINTA) 60 MG TABS tablet Take 1 tablet (60 mg total) by mouth 2 (two) times daily. Patient taking differently: Take 90 mg by mouth 2 (two) times daily.  08/08/15   Herminio Commons, MD   Triage Vitals: BP 133/64 mmHg  Pulse 70  Temp(Src) 97.8 F (36.6 C) (Oral)  Resp 18  Ht 5\' 10"  (1.778 m)  Wt 170 lb (77.111 kg)  BMI 24.39 kg/m2  SpO2 96% Physical Exam  Constitutional: He is oriented to person, place, and time. He appears well-developed and well-nourished.  HENT:  Head: Normocephalic and atraumatic.  Eyes: EOM are normal.  Neck: Normal range of motion.  Cardiovascular: Normal rate, regular rhythm, normal heart sounds and intact distal pulses.   Pulmonary/Chest: Effort normal and breath sounds normal. No respiratory distress.  Sternotomy scar. No chest wall tenderness   Abdominal: Soft. He exhibits no distension. There is no tenderness.  Musculoskeletal: Normal range of motion. He exhibits no edema.       Right upper leg: He exhibits no edema.       Left upper leg: He exhibits no edema.  Neurological: He is alert and oriented to person, place, and time.  Skin: Skin is warm and dry.  Psychiatric: He has a normal mood and affect. Judgment normal.  Nursing note and vitals reviewed.   ED Course  Procedures (including critical care time) DIAGNOSTIC STUDIES: Oxygen Saturation is 96% on ra, nl by my interpretation.    COORDINATION OF CARE: 11:19 AM: Discussed treatment plan which includes EKG with pt at bedside; patient verbalizes understanding and agrees with treatment plan.  Labs Review Labs Reviewed - No data to display  Imaging Review No results found. I have personally reviewed and evaluated these images and lab results as part of my medical decision-making.   EKG Interpretation   Date/Time:  Friday August 24 2015 10:37:57 EST Ventricular Rate:  81 PR Interval:  186 QRS Duration: 106 QT  Interval:  410 QTC Calculation: 476 R Axis:   89 Text Interpretation:  Sinus rhythm Borderline right axis deviation  Nonspecific repol abnormality, diffuse leads Borderline prolonged QT  interval No significant change since last tracing Confirmed by Leilany Digeronimo  MD,  Connee Ikner ([id number]) on 08/24/2015 11:38:39 AM      MDM   Final diagnoses:  Chest pain, unspecified chest pain type   82 year old male with chest pain. Frequent ER user for the same complaints. Now  resolved. Reports that pain he was having earlier this consistent with pain is been evaluated forrecently. EKG is not acutely change. Troponin is normal. Low suspicion for acute/emergent process do not feel that further testing is needed at this time. Return precautions were discussed. Outpatient follow-up.  Virgel Manifold, MD 08/28/15 1352

## 2015-08-24 NOTE — ED Notes (Signed)
Chest pain this morning.  Took sl ntg at home and rates pain a 1-2 out of 10.

## 2015-08-24 NOTE — Discharge Instructions (Signed)
Nonspecific Chest Pain  °Chest pain can be caused by many different conditions. There is always a chance that your pain could be related to something serious, such as a heart attack or a blood clot in your lungs. Chest pain can also be caused by conditions that are not life-threatening. If you have chest pain, it is very important to follow up with your health care provider. °CAUSES  °Chest pain can be caused by: °· Heartburn. °· Pneumonia or bronchitis. °· Anxiety or stress. °· Inflammation around your heart (pericarditis) or lung (pleuritis or pleurisy). °· A blood clot in your lung. °· A collapsed lung (pneumothorax). It can develop suddenly on its own (spontaneous pneumothorax) or from trauma to the chest. °· Shingles infection (varicella-zoster virus). °· Heart attack. °· Damage to the bones, muscles, and cartilage that make up your chest wall. This can include: °¨ Bruised bones due to injury. °¨ Strained muscles or cartilage due to frequent or repeated coughing or overwork. °¨ Fracture to one or more ribs. °¨ Sore cartilage due to inflammation (costochondritis). °RISK FACTORS  °Risk factors for chest pain may include: °· Activities that increase your risk for trauma or injury to your chest. °· Respiratory infections or conditions that cause frequent coughing. °· Medical conditions or overeating that can cause heartburn. °· Heart disease or family history of heart disease. °· Conditions or health behaviors that increase your risk of developing a blood clot. °· Having had chicken pox (varicella zoster). °SIGNS AND SYMPTOMS °Chest pain can feel like: °· Burning or tingling on the surface of your chest or deep in your chest. °· Crushing, pressure, aching, or squeezing pain. °· Dull or sharp pain that is worse when you move, cough, or take a deep breath. °· Pain that is also felt in your back, neck, shoulder, or arm, or pain that spreads to any of these areas. °Your chest pain may come and go, or it may stay  constant. °DIAGNOSIS °Lab tests or other studies may be needed to find the cause of your pain. Your health care provider may have you take a test called an ambulatory ECG (electrocardiogram). An ECG records your heartbeat patterns at the time the test is performed. You may also have other tests, such as: °· Transthoracic echocardiogram (TTE). During echocardiography, sound waves are used to create a picture of all of the heart structures and to look at how blood flows through your heart. °· Transesophageal echocardiogram (TEE). This is a more advanced imaging test that obtains images from inside your body. It allows your health care provider to see your heart in finer detail. °· Cardiac monitoring. This allows your health care provider to monitor your heart rate and rhythm in real time. °· Holter monitor. This is a portable device that records your heartbeat and can help to diagnose abnormal heartbeats. It allows your health care provider to track your heart activity for several days, if needed. °· Stress tests. These can be done through exercise or by taking medicine that makes your heart beat more quickly. °· Blood tests. °· Imaging tests. °TREATMENT  °Your treatment depends on what is causing your chest pain. Treatment may include: °· Medicines. These may include: °¨ Acid blockers for heartburn. °¨ Anti-inflammatory medicine. °¨ Pain medicine for inflammatory conditions. °¨ Antibiotic medicine, if an infection is present. °¨ Medicines to dissolve blood clots. °¨ Medicines to treat coronary artery disease. °· Supportive care for conditions that do not require medicines. This may include: °¨ Resting. °¨ Applying heat   or cold packs to injured areas. °¨ Limiting activities until pain decreases. °HOME CARE INSTRUCTIONS °· If you were prescribed an antibiotic medicine, finish it all even if you start to feel better. °· Avoid any activities that bring on chest pain. °· Do not use any tobacco products, including  cigarettes, chewing tobacco, or electronic cigarettes. If you need help quitting, ask your health care provider. °· Do not drink alcohol. °· Take medicines only as directed by your health care provider. °· Keep all follow-up visits as directed by your health care provider. This is important. This includes any further testing if your chest pain does not go away. °· If heartburn is the cause for your chest pain, you may be told to keep your head raised (elevated) while sleeping. This reduces the chance that acid will go from your stomach into your esophagus. °· Make lifestyle changes as directed by your health care provider. These may include: °¨ Getting regular exercise. Ask your health care provider to suggest some activities that are safe for you. °¨ Eating a heart-healthy diet. A registered dietitian can help you to learn healthy eating options. °¨ Maintaining a healthy weight. °¨ Managing diabetes, if necessary. °¨ Reducing stress. °SEEK MEDICAL CARE IF: °· Your chest pain does not go away after treatment. °· You have a rash with blisters on your chest. °· You have a fever. °SEEK IMMEDIATE MEDICAL CARE IF:  °· Your chest pain is worse. °· You have an increasing cough, or you cough up blood. °· You have severe abdominal pain. °· You have severe weakness. °· You faint. °· You have chills. °· You have sudden, unexplained chest discomfort. °· You have sudden, unexplained discomfort in your arms, back, neck, or jaw. °· You have shortness of breath at any time. °· You suddenly start to sweat, or your skin gets clammy. °· You feel nauseous or you vomit. °· You suddenly feel light-headed or dizzy. °· Your heart begins to beat quickly, or it feels like it is skipping beats. °These symptoms may represent a serious problem that is an emergency. Do not wait to see if the symptoms will go away. Get medical help right away. Call your local emergency services (911 in the [place] Do not drive yourself to the hospital. °  °This  information is not intended to replace advice given to you by your health care provider. Make sure you discuss any questions you have with your health care provider. °  °Document Released: 04/09/2005 Document Revised: 07/21/2014 Document Reviewed: 02/03/2014 °Elsevier Interactive Patient Education ©2016 [employer] ° °

## 2015-08-26 ENCOUNTER — Encounter (HOSPITAL_COMMUNITY): Payer: Self-pay | Admitting: Emergency Medicine

## 2015-08-26 ENCOUNTER — Emergency Department (HOSPITAL_COMMUNITY): Payer: Medicare Other

## 2015-08-26 ENCOUNTER — Emergency Department (HOSPITAL_COMMUNITY)
Admission: EM | Admit: 2015-08-26 | Discharge: 2015-08-26 | Disposition: A | Discharge status: Home / Self Care | Payer: Medicare Other | Attending: Emergency Medicine | Admitting: Emergency Medicine

## 2015-08-26 DIAGNOSIS — G8929 Other chronic pain: Secondary | ICD-10-CM | POA: Diagnosis not present

## 2015-08-26 DIAGNOSIS — E785 Hyperlipidemia, unspecified: Secondary | ICD-10-CM | POA: Diagnosis not present

## 2015-08-26 DIAGNOSIS — E119 Type 2 diabetes mellitus without complications: Secondary | ICD-10-CM | POA: Insufficient documentation

## 2015-08-26 DIAGNOSIS — E039 Hypothyroidism, unspecified: Secondary | ICD-10-CM | POA: Diagnosis not present

## 2015-08-26 DIAGNOSIS — Z951 Presence of aortocoronary bypass graft: Secondary | ICD-10-CM | POA: Insufficient documentation

## 2015-08-26 DIAGNOSIS — Z9861 Coronary angioplasty status: Secondary | ICD-10-CM | POA: Diagnosis not present

## 2015-08-26 DIAGNOSIS — M1990 Unspecified osteoarthritis, unspecified site: Secondary | ICD-10-CM | POA: Insufficient documentation

## 2015-08-26 DIAGNOSIS — Z8701 Personal history of pneumonia (recurrent): Secondary | ICD-10-CM | POA: Insufficient documentation

## 2015-08-26 DIAGNOSIS — R079 Chest pain, unspecified: Secondary | ICD-10-CM | POA: Diagnosis present

## 2015-08-26 DIAGNOSIS — Z7982 Long term (current) use of aspirin: Secondary | ICD-10-CM | POA: Insufficient documentation

## 2015-08-26 DIAGNOSIS — Z8711 Personal history of peptic ulcer disease: Secondary | ICD-10-CM | POA: Diagnosis not present

## 2015-08-26 DIAGNOSIS — I2510 Atherosclerotic heart disease of native coronary artery without angina pectoris: Secondary | ICD-10-CM | POA: Insufficient documentation

## 2015-08-26 DIAGNOSIS — Z87891 Personal history of nicotine dependence: Secondary | ICD-10-CM | POA: Insufficient documentation

## 2015-08-26 DIAGNOSIS — Z7984 Long term (current) use of oral hypoglycemic drugs: Secondary | ICD-10-CM | POA: Insufficient documentation

## 2015-08-26 DIAGNOSIS — Z8673 Personal history of transient ischemic attack (TIA), and cerebral infarction without residual deficits: Secondary | ICD-10-CM | POA: Insufficient documentation

## 2015-08-26 DIAGNOSIS — N400 Enlarged prostate without lower urinary tract symptoms: Secondary | ICD-10-CM | POA: Insufficient documentation

## 2015-08-26 DIAGNOSIS — Z8781 Personal history of (healed) traumatic fracture: Secondary | ICD-10-CM | POA: Insufficient documentation

## 2015-08-26 DIAGNOSIS — Z79899 Other long term (current) drug therapy: Secondary | ICD-10-CM | POA: Insufficient documentation

## 2015-08-26 DIAGNOSIS — Z9889 Other specified postprocedural states: Secondary | ICD-10-CM | POA: Insufficient documentation

## 2015-08-26 LAB — COMPREHENSIVE METABOLIC PANEL
ALK PHOS: 76 U/L (ref 38–126)
ALT: 24 U/L (ref 17–63)
ANION GAP: 10 (ref 5–15)
AST: 26 U/L (ref 15–41)
Albumin: 3.5 g/dL (ref 3.5–5.0)
BILIRUBIN TOTAL: 0.4 mg/dL (ref 0.3–1.2)
BUN: 14 mg/dL (ref 6–20)
CALCIUM: 9.1 mg/dL (ref 8.9–10.3)
CO2: 24 mmol/L (ref 22–32)
Chloride: 103 mmol/L (ref 101–111)
Creatinine, Ser: 0.85 mg/dL (ref 0.61–1.24)
GFR calc Af Amer: >60 mL/min (ref >60)
GLUCOSE: 257 mg/dL — AB (ref 65–99)
Potassium: 4.3 mmol/L (ref 3.5–5.1)
Sodium: 137 mmol/L (ref 135–145)
TOTAL PROTEIN: 6.2 g/dL — AB (ref 6.5–8.1)

## 2015-08-26 LAB — APTT: aPTT: 26 seconds (ref 24–37)

## 2015-08-26 LAB — CBC
HEMATOCRIT: 35.8 % — AB (ref 39.0–52.0)
HEMOGLOBIN: 12.4 g/dL — AB (ref 13.0–17.0)
MCH: 32.5 pg (ref 26.0–34.0)
MCHC: 34.6 g/dL (ref 30.0–36.0)
MCV: 93.7 fL (ref 78.0–100.0)
Platelets: 144 10*3/uL — ABNORMAL LOW (ref 150–400)
RBC: 3.82 MIL/uL — AB (ref 4.22–5.81)
RDW: 12.9 % (ref 11.5–15.5)
WBC: 5.8 10*3/uL (ref 4.0–10.5)

## 2015-08-26 LAB — TROPONIN I: Troponin I: <0.03 ng/mL (ref <0.031)

## 2015-08-26 LAB — PROTIME-INR
INR: 1.01 (ref 0.00–1.49)
PROTHROMBIN TIME: 13.5 s (ref 11.6–15.2)

## 2015-08-26 LAB — I-STAT TROPONIN, ED: TROPONIN I, POC: 0.02 ng/mL (ref 0.00–0.08)

## 2015-08-26 MED ORDER — ASPIRIN 81 MG PO CHEW: 324.0000 mg | CHEWABLE_TABLET | Freq: Once | ORAL | Status: DC

## 2015-08-26 MED ORDER — NITROGLYCERIN 0.4 MG SL SUBL: 0.4000 mg | SUBLINGUAL_TABLET | SUBLINGUAL | Status: DC | PRN

## 2015-08-26 NOTE — Discharge Instructions (Signed)

## 2015-08-26 NOTE — ED Provider Notes (Signed)
CSN: [id number]     Arrival date & time 08/26/15  1004 History  By signing my name below, I, Arianna Nassar, attest that this documentation has been prepared under the direction and in the presence of Pattricia Boss, MD. Electronically Signed: Julien Nordmann, ED Scribe. 08/26/2015. 11:02 AM.      Chief Complaint  Patient presents with  . Chest Pain      Patient is a 82 y.o. male presenting with chest pain. The history is provided by the patient. No language interpreter was used.  Chest Pain Pain location:  R chest Pain quality comment:  "felt as if someone had hit him" Pain radiates to:  Does not radiate Pain radiates to the back: no   Pain severity:  Moderate Onset quality:  Sudden Duration:  2 hours Timing:  Intermittent Progression:  Unchanged Chronicity:  Chronic Relieved by:  Nothing Worsened by:  Nothing tried Associated symptoms: no shortness of breath and not vomiting   Risk factors: coronary artery disease, diabetes mellitus, hypertension and male sex    HPI Comments: Jeffrey [name] is a 82 y.o. male who has a hx pf DMII, hyperlipidemia, HTN, chronic back pain, BPH, GERD, hyperthyroidism and chronic chest pain presents to the Emergency Department complaining of constant, gradual worsening, moderate, chest pain that felt like "someone had hit him" onset this morning that is now alleviated. He states He reports having pain every day but but today the pain was more on the right side. He usually takes pepto bismal to alleviate his chest pain with no relief. He was seen on 2/6 for the same symptoms. Denies shortness of breath and vomiting.   Past Medical History  Diagnosis Date  . Diabetes mellitus, type II ([place])   . Hyperlipidemia   . Coronary atherosclerosis of native coronary artery     a. CABG x 4 in 1989 (VG->OM1->OM2, VG->RCA, LIMA->LAD), b. 05/2010: DES to VG-OM1/OM2, DES to distal LCx. c. NSTEMI in 04/2011 - TO distal LCX stent and VG->OM2. d. 02/2012 NSTEMI DES to  VG-OM1/continuation to OM2 occluded. e. inferior STEMI s/p DES to SVG-RAMUS 06/2012. f. inferolat STEMI 09/2012 s/p DES to SVG-interm; g. Lex MV (11/14):  EF 35%, inf-lat scar with small peri-infarct ischemia  . Essential hypertension, benign   . Osteoarthritis   . History of stroke   . History of pneumonia   . Cervical vertebral fracture ([place])   . Chronic back pain   . Benign prostatic hypertrophy     History of urinary retention  . Peptic ulcer disease   . Gastroesophageal reflux disease   . Ischemic cardiomyopathy Nov 2015    EF 35% cath, 45-50% by echo  . Hypothyroidism   . Anxiety about health     "multiple somatic complaints"  . Chronic chest pain    Past Surgical History  Procedure Laterality Date  . Tonsillectomy    . Coronary artery bypass graft  1989  . Coronary angioplasty  10/12, 8/13, 12/13, 3/14    SVG-OM PCI  . Cardiac catheterization  05/19/14    SVG-OM occl- medical Rx  . Left heart catheterization with coronary angiogram N/A 11/01/2011    Procedure: LEFT HEART CATHETERIZATION WITH CORONARY ANGIOGRAM;  Surgeon: Lorretta Harp, MD;  Location: [facility] CATH LAB;  Service: Cardiovascular;  Laterality: N/A;  . Percutaneous coronary stent intervention (pci-s) N/A 11/01/2011    Procedure: PERCUTANEOUS CORONARY STENT INTERVENTION (PCI-S);  Surgeon: Lorretta Harp, MD;  Location: [facility] CATH LAB;  Service: Cardiovascular;  Laterality:  N/A;  . Left heart catheterization with coronary/graft angiogram N/A 03/10/2012    Procedure: LEFT HEART CATHETERIZATION WITH [name];  Surgeon: Sherren Mocha, MD;  Location: [facility] CATH LAB;  Service: Cardiovascular;  Laterality: N/A;  . Left heart catheterization with coronary angiogram N/A 06/18/2012    Procedure: LEFT HEART CATHETERIZATION WITH CORONARY ANGIOGRAM;  Surgeon: Peter M Martinique, MD;  Location: [facility] CATH LAB;  Service: Cardiovascular;  Laterality: N/A;  . Percutaneous coronary stent intervention (pci-s)  06/18/2012    Procedure:  PERCUTANEOUS CORONARY STENT INTERVENTION (PCI-S);  Surgeon: Peter M Martinique, MD;  Location: [facility] CATH LAB;  Service: Cardiovascular;;  . Left heart catheterization with coronary/graft angiogram  10/06/2012    Procedure: LEFT HEART CATHETERIZATION WITH [name];  Surgeon: Burnell Blanks, MD;  Location: [facility] CATH LAB;  Service: Cardiovascular;;  . Percutaneous coronary stent intervention (pci-s)  10/06/2012    Procedure: PERCUTANEOUS CORONARY STENT INTERVENTION (PCI-S);  Surgeon: Burnell Blanks, MD;  Location: [facility] CATH LAB;  Service: Cardiovascular;;  . Left heart catheterization with coronary/graft angiogram N/A 11/09/2012    Procedure: LEFT HEART CATHETERIZATION WITH [name];  Surgeon: Peter M Martinique, MD;  Location: [facility] CATH LAB;  Service: Cardiovascular;  Laterality: N/A;  . Left heart catheterization with coronary/graft angiogram N/A 05/19/2014    Procedure: LEFT HEART CATHETERIZATION WITH [name];  Surgeon: Troy Sine, MD;  Location: [facility] CATH LAB;  Service: Cardiovascular;  Laterality: N/A;   Family History  Problem Relation Age of Onset  . Early death      Parents died young  . Appendicitis Mother     Pt was 56 year old  . Heart attack Father 59   Social History  Substance Use Topics  . Smoking status: Former Smoker -- 2.00 packs/day for 10 years    Types: Cigarettes    Start date: 07/14/1950    Quit date: 07/14/1961  . Smokeless tobacco: Current User    Types: Chew  . Alcohol Use: No    Review of Systems  Respiratory: Negative for shortness of breath.   Cardiovascular: Positive for chest pain.  Gastrointestinal: Negative for vomiting.      Allergies  Review of patient's allergies indicates no known allergies.  Home Medications   Prior to Admission medications   Medication Sig Start Date End Date Taking? Authorizing Provider  acetaminophen (TYLENOL) 325 MG tablet Take 2 tablets (650 mg total) by mouth every 4  (four) hours as needed for headache or mild pain. Patient taking differently: Take 325 mg by mouth 2 (two) times daily as needed for headache or mild pain.  05/21/14   Erlene Quan, PA-C  albuterol (PROVENTIL HFA;VENTOLIN HFA) 108 (90 BASE) MCG/ACT inhaler Inhale 2 puffs into the lungs every 6 (six) hours as needed for wheezing or shortness of breath.    Historical Provider, MD  ALPRAZolam Duanne Moron) 0.25 MG tablet Take 0.25 mg by mouth 2 (two) times daily.    Historical Provider, MD  aspirin 81 MG tablet Take 81 mg by mouth daily.    Historical Provider, MD  atorvastatin (LIPITOR) 40 MG tablet Take 1 tablet (40 mg total) by mouth daily at 6 PM. 05/29/13   Liliane Shi, PA-C  bismuth subsalicylate (PEPTO BISMOL) 262 MG/15ML suspension Take 30 mLs by mouth every 6 (six) hours as needed for indigestion or diarrhea or loose stools.     Historical Provider, MD  carvedilol (COREG) 3.125 MG tablet TAKE 1 TABLET BY MOUTH TWICE DAILY WITH MEALS. 07/26/15  Lendon Colonel, NP  famotidine (PEPCID) 20 MG tablet Take 1 tablet (20 mg total) by mouth at bedtime. 03/03/15   Rexene Alberts, MD  furosemide (LASIX) 20 MG tablet Take 20 mg by mouth as needed for fluid. Reported on 06/29/2015 05/06/13   Lendon Colonel, NP  isosorbide mononitrate (IMDUR) 30 MG 24 hr tablet Take 1.5 tablets (45 mg total) by mouth daily. 03/03/15   Rexene Alberts, MD  levothyroxine (SYNTHROID, LEVOTHROID) 50 MCG tablet Take 50 mcg by mouth daily before breakfast.  09/08/14   Historical Provider, MD  loperamide (IMODIUM) 2 MG capsule Take 1 capsule (2 mg total) by mouth 4 (four) times daily as needed for diarrhea or loose stools. 04/01/15   Kristen N Ward, DO  metFORMIN (GLUCOPHAGE) 500 MG tablet Take 2 tablets (1,000 mg total) by mouth 2 (two) times daily with a meal. RESTART ON Sunday, 03/04/15. Patient taking differently: Take 500 mg by mouth 2 (two) times daily with a meal.  03/03/15   Rexene Alberts, MD  Multiple Vitamin (MULTIVITAMIN WITH  MINERALS) TABS Take 1 tablet by mouth daily.    Historical Provider, MD  NITROSTAT 0.4 MG SL tablet PLACE 1 TAB UNDER TONGUE EVERY 5 MIN IF NEEDED FOR CHEST PAIN. MAY USE 3 TIMES.NO RELIEF CALL 911. 07/04/15   Lendon Colonel, NP  ranolazine (RANEXA) 500 MG 12 hr tablet Take 1 tablet (500 mg total) by mouth 2 (two) times daily. 08/20/15   Orlie Dakin, MD  tamsulosin (FLOMAX) 0.4 MG CAPS Take 0.4 mg by mouth 2 (two) times daily.  01/27/13   Historical Provider, MD  ticagrelor (BRILINTA) 60 MG TABS tablet Take 1 tablet (60 mg total) by mouth 2 (two) times daily. Patient taking differently: Take 90 mg by mouth 2 (two) times daily.  08/08/15   Herminio Commons, MD   There were no vitals taken for this visit. Physical Exam  Constitutional: He is oriented to person, place, and time. He appears well-developed and well-nourished.  HENT:  Head: Normocephalic and atraumatic.  Right Ear: External ear normal.  Left Ear: External ear normal.  Nose: Nose normal.  Mouth/Throat: Oropharynx is clear and moist.  Eyes: Conjunctivae and EOM are normal. Pupils are equal, round, and reactive to light.  Neck: Normal range of motion. Neck supple.  Cardiovascular: Normal rate, regular rhythm, normal heart sounds and intact distal pulses.   Pulmonary/Chest: Effort normal and breath sounds normal. No respiratory distress. He has no wheezes. He exhibits no tenderness.  Abdominal: Soft. Bowel sounds are normal. He exhibits no distension and no mass. There is no tenderness. There is no guarding.  Musculoskeletal: Normal range of motion.  Neurological: He is alert and oriented to person, place, and time. He has normal reflexes. He exhibits normal muscle tone. Coordination normal.  Skin: Skin is warm and dry.  Psychiatric: He has a normal mood and affect. His behavior is normal. Judgment and thought content normal.  Nursing note and vitals reviewed.   ED Course  Procedures  COORDINATION OF CARE:  10:28 AM  Discussed treatment plan with pt at bedside and pt agreed to plan.  Labs Review Labs Reviewed  CBC - Abnormal; Notable for the following:    RBC 3.82 (*)    Hemoglobin 12.4 (*)    HCT 35.8 (*)    Platelets 144 (*)    All other components within normal limits  COMPREHENSIVE METABOLIC PANEL - Abnormal; Notable for the following:    Glucose, Bld 257 (*)  Total Protein 6.2 (*)    All other components within normal limits  APTT  PROTIME-INR  TROPONIN I  I-STAT TROPOININ, ED  Randolm Idol, ED    Imaging Review Dg Chest 2 View  08/26/2015  CLINICAL DATA:  Chest pain today EXAM: CHEST  2 VIEW COMPARISON:  Chest x-rays dated 08/12/2015 in 06/1929 2016. FINDINGS: Heart size is normal. Overall cardiomediastinal silhouette is stable in size and configuration. Atherosclerotic calcifications again noted at the aortic arch. Patient is status post surgical changes of previous CABG. Median sternotomy wires appear intact and stable in alignment. Lungs are clear. No pleural effusion. No pneumothorax seen. No acute osseous abnormality. Mild degenerative spurring again noted within the thoracic spine. Probable old fracture of a left lower rib. IMPRESSION: No active cardiopulmonary disease. Electronically Signed   By: Franki Cabot M.D.   On: 08/26/2015 11:30   I have personally reviewed and evaluated these images and lab results as part of my medical decision-making.   EKG Interpretation   Date/Time:  Sunday August 26 2015 10:22:22 EST Ventricular Rate:  85 PR Interval:  191 QRS Duration: 104 QT Interval:  368 QTC Calculation: 438 R Axis:   -40 Text Interpretation:  Sinus rhythm Left axis deviation Nonspecific repol  abnormality, diffuse leads Confirmed by Tisheena Maguire MD, Andee Poles [id number]) on  08/26/2015 2:03:26 PM      MDM   Final diagnoses:  Chest pain, unspecified chest pain type   82 year old man with known coronary artery disease and daily episodes of chest pain who presents frequently  for evaluation. Today is unchanged from prior episodes. He had pain that has resolved. His had 2 troponins here that are both negative. EKG is unchanged from prior. Patient remains hemodynamically stable. I discussed return precautions and need for follow-up pain voices understanding. I personally performed the services described in this documentation, which was scribed in my presence. The recorded information has been reviewed and considered.    Pattricia Boss, MD 08/26/15 [phone]

## 2015-08-26 NOTE — ED Notes (Signed)
Patient c/o right side chest pain, non-radiating. Per patient weakness and "slight shortness of breath." Per patient took Peptobismol but has not taken any of his nitroglycerin tablets. Per patient pain stopped after checking into ER.

## 2015-08-26 NOTE — ED Notes (Signed)
i-statTroponin 0.01 ng/mL

## 2015-08-29 ENCOUNTER — Encounter (HOSPITAL_COMMUNITY): Payer: Self-pay | Admitting: Emergency Medicine

## 2015-08-29 ENCOUNTER — Emergency Department (HOSPITAL_COMMUNITY)
Admission: EM | Admit: 2015-08-29 | Discharge: 2015-08-29 | Disposition: A | Discharge status: Home / Self Care | Payer: Medicare Other | Attending: Emergency Medicine | Admitting: Emergency Medicine

## 2015-08-29 DIAGNOSIS — Z7982 Long term (current) use of aspirin: Secondary | ICD-10-CM | POA: Diagnosis not present

## 2015-08-29 DIAGNOSIS — Z8701 Personal history of pneumonia (recurrent): Secondary | ICD-10-CM | POA: Insufficient documentation

## 2015-08-29 DIAGNOSIS — Z79899 Other long term (current) drug therapy: Secondary | ICD-10-CM | POA: Diagnosis not present

## 2015-08-29 DIAGNOSIS — Z9889 Other specified postprocedural states: Secondary | ICD-10-CM | POA: Diagnosis not present

## 2015-08-29 DIAGNOSIS — E785 Hyperlipidemia, unspecified: Secondary | ICD-10-CM | POA: Insufficient documentation

## 2015-08-29 DIAGNOSIS — G8929 Other chronic pain: Secondary | ICD-10-CM | POA: Insufficient documentation

## 2015-08-29 DIAGNOSIS — N400 Enlarged prostate without lower urinary tract symptoms: Secondary | ICD-10-CM | POA: Insufficient documentation

## 2015-08-29 DIAGNOSIS — E039 Hypothyroidism, unspecified: Secondary | ICD-10-CM | POA: Insufficient documentation

## 2015-08-29 DIAGNOSIS — R079 Chest pain, unspecified: Secondary | ICD-10-CM | POA: Diagnosis not present

## 2015-08-29 DIAGNOSIS — Z87891 Personal history of nicotine dependence: Secondary | ICD-10-CM | POA: Diagnosis not present

## 2015-08-29 DIAGNOSIS — Z951 Presence of aortocoronary bypass graft: Secondary | ICD-10-CM | POA: Insufficient documentation

## 2015-08-29 DIAGNOSIS — Z7984 Long term (current) use of oral hypoglycemic drugs: Secondary | ICD-10-CM | POA: Diagnosis not present

## 2015-08-29 DIAGNOSIS — M1990 Unspecified osteoarthritis, unspecified site: Secondary | ICD-10-CM | POA: Insufficient documentation

## 2015-08-29 DIAGNOSIS — Z8673 Personal history of transient ischemic attack (TIA), and cerebral infarction without residual deficits: Secondary | ICD-10-CM | POA: Diagnosis not present

## 2015-08-29 DIAGNOSIS — I10 Essential (primary) hypertension: Secondary | ICD-10-CM | POA: Insufficient documentation

## 2015-08-29 DIAGNOSIS — I2510 Atherosclerotic heart disease of native coronary artery without angina pectoris: Secondary | ICD-10-CM | POA: Insufficient documentation

## 2015-08-29 DIAGNOSIS — E119 Type 2 diabetes mellitus without complications: Secondary | ICD-10-CM | POA: Insufficient documentation

## 2015-08-29 DIAGNOSIS — Z8781 Personal history of (healed) traumatic fracture: Secondary | ICD-10-CM | POA: Diagnosis not present

## 2015-08-29 DIAGNOSIS — K219 Gastro-esophageal reflux disease without esophagitis: Secondary | ICD-10-CM | POA: Diagnosis not present

## 2015-08-29 DIAGNOSIS — F419 Anxiety disorder, unspecified: Secondary | ICD-10-CM | POA: Diagnosis not present

## 2015-08-29 DIAGNOSIS — Z9861 Coronary angioplasty status: Secondary | ICD-10-CM | POA: Insufficient documentation

## 2015-08-29 DIAGNOSIS — Z8711 Personal history of peptic ulcer disease: Secondary | ICD-10-CM | POA: Diagnosis not present

## 2015-08-29 LAB — I-STAT TROPONIN, ED
TROPONIN I, POC: 0.01 ng/mL (ref 0.00–0.08)
Troponin i, poc: 0.02 ng/mL (ref 0.00–0.08)

## 2015-08-29 NOTE — Discharge Instructions (Signed)
Workup for the chest pain without any acute findings. Follow-up with your doctor. Return for any new or worse symptoms.

## 2015-08-29 NOTE — ED Notes (Signed)
Pt c/o recurrent central, non-radiating CP that came back this morning. Pt also reports mild SOB and dizziness. Pt states, "it feels like indigestion." AOx4. VS WDL.

## 2015-08-29 NOTE — ED Notes (Signed)
Patient states he is ready to go home at this time. RN aware.

## 2015-08-29 NOTE — ED Notes (Signed)
Patient states "I'm ready to go home." Patient denies pain at this time. Advised Dr Rogene Houston.

## 2015-08-29 NOTE — ED Provider Notes (Signed)
CSN: [id number]     Arrival date & time 08/29/15  0807 History  By signing my name below, I, Jeffrey [name], attest that this documentation has been prepared under the direction and in the presence of Jeffrey Sorrow, MD. Electronically Signed: [name], ED Scribe. 08/29/2015. 9:30 AM.   Chief Complaint  Patient presents with  . Chest Pain   Patient is a 82 y.o. male presenting with chest pain. The history is provided by the patient. No language interpreter was used.  Chest Pain Associated symptoms: no abdominal pain, no back pain, no cough, no fever, no headache, no nausea, no shortness of breath and not vomiting    81 year old gentleman with known history of coronary artery disease. Frequently seen in the emergency department for chest pain. Patient had onset of chest pain this morning at about 7:30 in the morning. Is now resolved. Patient has had 4 visits this month already for this. And had several visits in January for the same complaint. Most the time patient's troponins checked out as normal. Part of it is anxiety on the patient's part he lives by himself. Denies any nausea vomiting or shortness of breath. Pain was anterior chest the patient stated that it was 6 out of 10. Nonradiating. Now has resolved.     Past Medical History  Diagnosis Date  . Diabetes mellitus, type II ([place])   . Hyperlipidemia   . Coronary atherosclerosis of native coronary artery     a. CABG x 4 in 1989 (VG->OM1->OM2, VG->RCA, LIMA->LAD), b. 05/2010: DES to VG-OM1/OM2, DES to distal LCx. c. NSTEMI in 04/2011 - TO distal LCX stent and VG->OM2. d. 02/2012 NSTEMI DES to VG-OM1/continuation to OM2 occluded. e. inferior STEMI s/p DES to SVG-RAMUS 06/2012. f. inferolat STEMI 09/2012 s/p DES to SVG-interm; g. Lex MV (11/14):  EF 35%, inf-lat scar with small peri-infarct ischemia  . Essential hypertension, benign   . Osteoarthritis   . History of stroke   . History of pneumonia   . Cervical vertebral fracture ([place])    . Chronic back pain   . Benign prostatic hypertrophy     History of urinary retention  . Peptic ulcer disease   . Gastroesophageal reflux disease   . Ischemic cardiomyopathy Nov 2015    EF 35% cath, 45-50% by echo  . Hypothyroidism   . Anxiety about health     "multiple somatic complaints"  . Chronic chest pain    Past Surgical History  Procedure Laterality Date  . Tonsillectomy    . Coronary artery bypass graft  1989  . Coronary angioplasty  10/12, 8/13, 12/13, 3/14    SVG-OM PCI  . Cardiac catheterization  05/19/14    SVG-OM occl- medical Rx  . Left heart catheterization with coronary angiogram N/A 11/01/2011    Procedure: LEFT HEART CATHETERIZATION WITH CORONARY ANGIOGRAM;  Surgeon: Lorretta Harp, MD;  Location: [facility] CATH LAB;  Service: Cardiovascular;  Laterality: N/A;  . Percutaneous coronary stent intervention (pci-s) N/A 11/01/2011    Procedure: PERCUTANEOUS CORONARY STENT INTERVENTION (PCI-S);  Surgeon: Lorretta Harp, MD;  Location: [facility] CATH LAB;  Service: Cardiovascular;  Laterality: N/A;  . Left heart catheterization with coronary/graft angiogram N/A 03/10/2012    Procedure: LEFT HEART CATHETERIZATION WITH [name];  Surgeon: Sherren Mocha, MD;  Location: [facility] CATH LAB;  Service: Cardiovascular;  Laterality: N/A;  . Left heart catheterization with coronary angiogram N/A 06/18/2012    Procedure: LEFT HEART CATHETERIZATION WITH CORONARY ANGIOGRAM;  Surgeon: Peter M Martinique, MD;  Location: [place] CATH LAB;  Service: Cardiovascular;  Laterality: N/A;  . Percutaneous coronary stent intervention (pci-s)  06/18/2012    Procedure: PERCUTANEOUS CORONARY STENT INTERVENTION (PCI-S);  Surgeon: Peter M Martinique, MD;  Location: [facility] CATH LAB;  Service: Cardiovascular;;  . Left heart catheterization with coronary/graft angiogram  10/06/2012    Procedure: LEFT HEART CATHETERIZATION WITH [name];  Surgeon: Burnell Blanks, MD;  Location: [facility] CATH LAB;  Service:  Cardiovascular;;  . Percutaneous coronary stent intervention (pci-s)  10/06/2012    Procedure: PERCUTANEOUS CORONARY STENT INTERVENTION (PCI-S);  Surgeon: Burnell Blanks, MD;  Location: [facility] CATH LAB;  Service: Cardiovascular;;  . Left heart catheterization with coronary/graft angiogram N/A 11/09/2012    Procedure: LEFT HEART CATHETERIZATION WITH [name];  Surgeon: Peter M Martinique, MD;  Location: [facility] CATH LAB;  Service: Cardiovascular;  Laterality: N/A;  . Left heart catheterization with coronary/graft angiogram N/A 05/19/2014    Procedure: LEFT HEART CATHETERIZATION WITH [name];  Surgeon: Troy Sine, MD;  Location: [facility] CATH LAB;  Service: Cardiovascular;  Laterality: N/A;   Family History  Problem Relation Age of Onset  . Early death      Parents died young  . Appendicitis Mother     Pt was 21 year old  . Heart attack Father 61   Social History  Substance Use Topics  . Smoking status: Former Smoker -- 2.00 packs/day for 10 years    Types: Cigarettes    Start date: 07/14/1950    Quit date: 07/14/1961  . Smokeless tobacco: Current User    Types: Chew  . Alcohol Use: No    Review of Systems  Constitutional: Negative for fever.  HENT: Negative for congestion, rhinorrhea and sore throat.   Eyes: Negative for visual disturbance.  Respiratory: Negative for cough and shortness of breath.   Cardiovascular: Positive for chest pain. Negative for leg swelling.  Gastrointestinal: Negative for nausea, vomiting, abdominal pain and diarrhea.  Genitourinary: Negative for dysuria and hematuria.  Musculoskeletal: Negative for back pain and neck pain.  Skin: Negative for rash.  Neurological: Negative for headaches.  Hematological: Does not bruise/bleed easily.  Psychiatric/Behavioral: Negative for confusion. The patient is nervous/anxious.       Allergies  Review of patient's allergies indicates no known allergies.  Home Medications   Prior to  Admission medications   Medication Sig Start Date End Date Taking? Authorizing Provider  acetaminophen (TYLENOL) 325 MG tablet Take 2 tablets (650 mg total) by mouth every 4 (four) hours as needed for headache or mild pain. Patient taking differently: Take 325 mg by mouth 2 (two) times daily as needed for headache or mild pain.  05/21/14  Yes Luke K Kilroy, PA-C  albuterol (PROVENTIL HFA;VENTOLIN HFA) 108 (90 BASE) MCG/ACT inhaler Inhale 2 puffs into the lungs every 6 (six) hours as needed for wheezing or shortness of breath.   Yes Historical Provider, MD  ALPRAZolam (XANAX) 0.25 MG tablet Take 0.25 mg by mouth 2 (two) times daily.   Yes Historical Provider, MD  aspirin 81 MG tablet Take 81 mg by mouth daily.   Yes Historical Provider, MD  atorvastatin (LIPITOR) 40 MG tablet Take 1 tablet (40 mg total) by mouth daily at 6 PM. 05/29/13  Yes Samay Delcarlo T Kathlen Mody, PA-C  bismuth subsalicylate (PEPTO BISMOL) 262 MG/15ML suspension Take 30 mLs by mouth every 6 (six) hours as needed for indigestion or diarrhea or loose stools.    Yes Historical Provider, MD  carvedilol (COREG) 3.125 MG tablet  TAKE 1 TABLET BY MOUTH TWICE DAILY WITH MEALS. 07/26/15  Yes Lendon Colonel, NP  famotidine (PEPCID) 20 MG tablet Take 1 tablet (20 mg total) by mouth at bedtime. 03/03/15  Yes Rexene Alberts, MD  furosemide (LASIX) 20 MG tablet Take 20 mg by mouth as needed for fluid. Reported on 06/29/2015 05/06/13  Yes Lendon Colonel, NP  isosorbide mononitrate (IMDUR) 30 MG 24 hr tablet Take 1.5 tablets (45 mg total) by mouth daily. 03/03/15  Yes Rexene Alberts, MD  levothyroxine (SYNTHROID, LEVOTHROID) 50 MCG tablet Take 50 mcg by mouth daily before breakfast.  09/08/14  Yes Historical Provider, MD  loperamide (IMODIUM) 2 MG capsule Take 1 capsule (2 mg total) by mouth 4 (four) times daily as needed for diarrhea or loose stools. 04/01/15  Yes Kristen N Ward, DO  metFORMIN (GLUCOPHAGE) 500 MG tablet Take 2 tablets (1,000 mg total) by  mouth 2 (two) times daily with a meal. RESTART ON Sunday, 03/04/15. Patient taking differently: Take 500 mg by mouth 2 (two) times daily with a meal.  03/03/15  Yes Rexene Alberts, MD  Multiple Vitamin (MULTIVITAMIN WITH MINERALS) TABS Take 1 tablet by mouth daily.   Yes Historical Provider, MD  NITROSTAT 0.4 MG SL tablet PLACE 1 TAB UNDER TONGUE EVERY 5 MIN IF NEEDED FOR CHEST PAIN. MAY USE 3 TIMES.NO RELIEF CALL 911. 07/04/15  Yes Lendon Colonel, NP  ranolazine (RANEXA) 500 MG 12 hr tablet Take 1 tablet (500 mg total) by mouth 2 (two) times daily. 08/20/15  Yes Orlie Dakin, MD  tamsulosin (FLOMAX) 0.4 MG CAPS Take 0.4 mg by mouth 2 (two) times daily.  01/27/13  Yes Historical Provider, MD  ticagrelor (BRILINTA) 60 MG TABS tablet Take 1 tablet (60 mg total) by mouth 2 (two) times daily. Patient taking differently: Take 90 mg by mouth 2 (two) times daily.  08/08/15  Yes Herminio Commons, MD   BP 130/64 mmHg  Pulse 74  Temp(Src) 97.5 F (36.4 C) (Oral)  Resp 19  Wt 77.111 kg  SpO2 97%   Physical Exam  Constitutional: He is oriented to person, place, and time. He appears well-developed and well-nourished. No distress.  HENT:  Head: Normocephalic and atraumatic.  Eyes: Conjunctivae and EOM are normal. Pupils are equal, round, and reactive to light.  Neck: Neck supple. No tracheal deviation present.  Cardiovascular: Normal rate, regular rhythm and normal heart sounds.   Pulmonary/Chest: Effort normal and breath sounds normal. No respiratory distress.  Abdominal: Soft. Bowel sounds are normal. There is no tenderness.  Musculoskeletal: Normal range of motion.  Neurological: He is alert and oriented to person, place, and time.  Skin: Skin is warm and dry.  Psychiatric: He has a normal mood and affect. His behavior is normal.  Nursing note and vitals reviewed.   ED Course  Procedures (including critical care time)  DIAGNOSTIC STUDIES: Oxygen Saturation is 99% on RA, normal by my  interpretation.    COORDINATION OF CARE: 9:30 AM-Discussed treatment plan which includes Troponin with pt at bedside and pt agreed to plan.   Labs Review Labs Reviewed  Randolm Idol, ED  Randolm Idol, ED    Imaging Review No results found. I have personally reviewed and evaluated these lab results as part of my medical decision-making.   EKG Interpretation   Date/Time:  Wednesday August 29 2015 08:13:21 EST Ventricular Rate:  86 PR Interval:  191 QRS Duration: 107 QT Interval:  377 QTC Calculation: 451 R Axis:  96 Text Interpretation:  Sinus rhythm Right axis deviation Nonspecific repol  abnormality, diffuse leads No significant change since last tracing  Confirmed by Djeneba Barsch  MD, Cynthie Garmon [phone]) on 08/29/2015 8:26:59 AM      MDM   Final diagnoses:  Chest pain, unspecified chest pain type     Patient with a history of cardiac disease seen frequently in the emergency department. Patient with frequent visits to the emergency department for chest pain this is best fourth visit this month. Workup -2 troponins here today are negative patient is currently chest pain-free. Became chest pain-free upon arrival here. Patient stable for discharge home.  I personally performed the services described in this documentation, which was scribed in my presence. The recorded information has been reviewed and is accurate.        Jeffrey Sorrow, MD 08/29/15 1256

## 2015-08-30 ENCOUNTER — Emergency Department (HOSPITAL_COMMUNITY)
Admission: EM | Admit: 2015-08-30 | Discharge: 2015-08-30 | Disposition: A | Discharge status: Home / Self Care | Payer: Medicare Other | Attending: Emergency Medicine | Admitting: Emergency Medicine

## 2015-08-30 ENCOUNTER — Encounter (HOSPITAL_COMMUNITY): Payer: Self-pay | Admitting: Cardiology

## 2015-08-30 DIAGNOSIS — Z951 Presence of aortocoronary bypass graft: Secondary | ICD-10-CM | POA: Insufficient documentation

## 2015-08-30 DIAGNOSIS — Z7982 Long term (current) use of aspirin: Secondary | ICD-10-CM | POA: Diagnosis not present

## 2015-08-30 DIAGNOSIS — G8929 Other chronic pain: Secondary | ICD-10-CM | POA: Diagnosis not present

## 2015-08-30 DIAGNOSIS — Z7984 Long term (current) use of oral hypoglycemic drugs: Secondary | ICD-10-CM | POA: Insufficient documentation

## 2015-08-30 DIAGNOSIS — I252 Old myocardial infarction: Secondary | ICD-10-CM | POA: Diagnosis not present

## 2015-08-30 DIAGNOSIS — Z8701 Personal history of pneumonia (recurrent): Secondary | ICD-10-CM | POA: Insufficient documentation

## 2015-08-30 DIAGNOSIS — Z87891 Personal history of nicotine dependence: Secondary | ICD-10-CM | POA: Insufficient documentation

## 2015-08-30 DIAGNOSIS — I10 Essential (primary) hypertension: Secondary | ICD-10-CM | POA: Diagnosis not present

## 2015-08-30 DIAGNOSIS — F419 Anxiety disorder, unspecified: Secondary | ICD-10-CM | POA: Insufficient documentation

## 2015-08-30 DIAGNOSIS — Z9861 Coronary angioplasty status: Secondary | ICD-10-CM | POA: Insufficient documentation

## 2015-08-30 DIAGNOSIS — Z8673 Personal history of transient ischemic attack (TIA), and cerebral infarction without residual deficits: Secondary | ICD-10-CM | POA: Diagnosis not present

## 2015-08-30 DIAGNOSIS — E785 Hyperlipidemia, unspecified: Secondary | ICD-10-CM | POA: Insufficient documentation

## 2015-08-30 DIAGNOSIS — R079 Chest pain, unspecified: Secondary | ICD-10-CM | POA: Insufficient documentation

## 2015-08-30 DIAGNOSIS — E119 Type 2 diabetes mellitus without complications: Secondary | ICD-10-CM | POA: Insufficient documentation

## 2015-08-30 DIAGNOSIS — E039 Hypothyroidism, unspecified: Secondary | ICD-10-CM | POA: Insufficient documentation

## 2015-08-30 DIAGNOSIS — Z8781 Personal history of (healed) traumatic fracture: Secondary | ICD-10-CM | POA: Diagnosis not present

## 2015-08-30 DIAGNOSIS — K219 Gastro-esophageal reflux disease without esophagitis: Secondary | ICD-10-CM | POA: Insufficient documentation

## 2015-08-30 DIAGNOSIS — M1990 Unspecified osteoarthritis, unspecified site: Secondary | ICD-10-CM | POA: Diagnosis not present

## 2015-08-30 DIAGNOSIS — Z8711 Personal history of peptic ulcer disease: Secondary | ICD-10-CM | POA: Insufficient documentation

## 2015-08-30 DIAGNOSIS — Z9889 Other specified postprocedural states: Secondary | ICD-10-CM | POA: Diagnosis not present

## 2015-08-30 DIAGNOSIS — I2510 Atherosclerotic heart disease of native coronary artery without angina pectoris: Secondary | ICD-10-CM | POA: Diagnosis not present

## 2015-08-30 DIAGNOSIS — N400 Enlarged prostate without lower urinary tract symptoms: Secondary | ICD-10-CM | POA: Diagnosis not present

## 2015-08-30 DIAGNOSIS — Z79899 Other long term (current) drug therapy: Secondary | ICD-10-CM | POA: Diagnosis not present

## 2015-08-30 LAB — TROPONIN I: Troponin I: <0.03 ng/mL (ref <0.031)

## 2015-08-30 LAB — I-STAT TROPONIN, ED: Troponin i, poc: 0.02 ng/mL (ref 0.00–0.08)

## 2015-08-30 NOTE — ED Provider Notes (Signed)
CSN: [id number]     Arrival date & time 08/30/15  1106 History   First MD Initiated Contact with Patient 08/30/15 1253     Chief Complaint  Patient presents with  . Chest Pain     (Consider location/radiation/quality/duration/timing/severity/associated sxs/prior Treatment) Patient is a 82 y.o. male presenting with chest pain. The history is provided by the patient.  Chest Pain Associated symptoms: no abdominal pain, no back pain, no fever, no headache, no nausea, no shortness of breath and not vomiting    Patient well known to me. Patient with frequent problems with chest pain. Evaluated by me yesterday with troponins 2 that were negative. Patient states that the his chest pain recurred again overnight resolved shortly after arrival here. Chest pain was low substernal epigastric area. Not associated with shortness of breath no nausea no vomiting. Patient is currently pain-free. Patient does have a known history of coronary artery disease. Patient was seen 6 times in January for chest pain without admission always was ruled out in the emergency department. And also seen 5 times now here in February. Part of patient's problem is anxiety over chest pain that he has in the confusion between it being legitimate coronary artery disease chest pain and most likely another type of chest pain that he has is noncardiac.  Past Medical History  Diagnosis Date  . Diabetes mellitus, type II ([place])   . Hyperlipidemia   . Coronary atherosclerosis of native coronary artery     a. CABG x 4 in 1989 (VG->OM1->OM2, VG->RCA, LIMA->LAD), b. 05/2010: DES to VG-OM1/OM2, DES to distal LCx. c. NSTEMI in 04/2011 - TO distal LCX stent and VG->OM2. d. 02/2012 NSTEMI DES to VG-OM1/continuation to OM2 occluded. e. inferior STEMI s/p DES to SVG-RAMUS 06/2012. f. inferolat STEMI 09/2012 s/p DES to SVG-interm; g. Lex MV (11/14):  EF 35%, inf-lat scar with small peri-infarct ischemia  . Essential hypertension, benign   .  Osteoarthritis   . History of stroke   . History of pneumonia   . Cervical vertebral fracture ([place])   . Chronic back pain   . Benign prostatic hypertrophy     History of urinary retention  . Peptic ulcer disease   . Gastroesophageal reflux disease   . Ischemic cardiomyopathy Nov 2015    EF 35% cath, 45-50% by echo  . Hypothyroidism   . Anxiety about health     "multiple somatic complaints"  . Chronic chest pain    Past Surgical History  Procedure Laterality Date  . Tonsillectomy    . Coronary artery bypass graft  1989  . Coronary angioplasty  10/12, 8/13, 12/13, 3/14    SVG-OM PCI  . Cardiac catheterization  05/19/14    SVG-OM occl- medical Rx  . Left heart catheterization with coronary angiogram N/A 11/01/2011    Procedure: LEFT HEART CATHETERIZATION WITH CORONARY ANGIOGRAM;  Surgeon: Lorretta Harp, MD;  Location: [facility] CATH LAB;  Service: Cardiovascular;  Laterality: N/A;  . Percutaneous coronary stent intervention (pci-s) N/A 11/01/2011    Procedure: PERCUTANEOUS CORONARY STENT INTERVENTION (PCI-S);  Surgeon: Lorretta Harp, MD;  Location: [facility] CATH LAB;  Service: Cardiovascular;  Laterality: N/A;  . Left heart catheterization with coronary/graft angiogram N/A 03/10/2012    Procedure: LEFT HEART CATHETERIZATION WITH [name];  Surgeon: Sherren Mocha, MD;  Location: [facility] CATH LAB;  Service: Cardiovascular;  Laterality: N/A;  . Left heart catheterization with coronary angiogram N/A 06/18/2012    Procedure: LEFT HEART CATHETERIZATION WITH CORONARY ANGIOGRAM;  Surgeon: Collier Salina  M Martinique, MD;  Location: [facility] CATH LAB;  Service: Cardiovascular;  Laterality: N/A;  . Percutaneous coronary stent intervention (pci-s)  06/18/2012    Procedure: PERCUTANEOUS CORONARY STENT INTERVENTION (PCI-S);  Surgeon: Peter M Martinique, MD;  Location: [facility] CATH LAB;  Service: Cardiovascular;;  . Left heart catheterization with coronary/graft angiogram  10/06/2012    Procedure: LEFT HEART CATHETERIZATION  WITH [name];  Surgeon: Burnell Blanks, MD;  Location: [facility] CATH LAB;  Service: Cardiovascular;;  . Percutaneous coronary stent intervention (pci-s)  10/06/2012    Procedure: PERCUTANEOUS CORONARY STENT INTERVENTION (PCI-S);  Surgeon: Burnell Blanks, MD;  Location: [facility] CATH LAB;  Service: Cardiovascular;;  . Left heart catheterization with coronary/graft angiogram N/A 11/09/2012    Procedure: LEFT HEART CATHETERIZATION WITH [name];  Surgeon: Peter M Martinique, MD;  Location: [facility] CATH LAB;  Service: Cardiovascular;  Laterality: N/A;  . Left heart catheterization with coronary/graft angiogram N/A 05/19/2014    Procedure: LEFT HEART CATHETERIZATION WITH [name];  Surgeon: Troy Sine, MD;  Location: [facility] CATH LAB;  Service: Cardiovascular;  Laterality: N/A;   Family History  Problem Relation Age of Onset  . Early death      Parents died young  . Appendicitis Mother     Pt was 54 year old  . Heart attack Father 38   Social History  Substance Use Topics  . Smoking status: Former Smoker -- 2.00 packs/day for 10 years    Types: Cigarettes    Start date: 07/14/1950    Quit date: 07/14/1961  . Smokeless tobacco: Current User    Types: Chew  . Alcohol Use: No    Review of Systems  Constitutional: Negative for fever.  HENT: Negative for congestion.   Eyes: Negative for visual disturbance.  Respiratory: Negative for shortness of breath.   Cardiovascular: Positive for chest pain.  Gastrointestinal: Negative for nausea, vomiting and abdominal pain.  Genitourinary: Negative for dysuria.  Musculoskeletal: Negative for back pain.  Skin: Negative for rash.  Neurological: Negative for syncope and headaches.  Hematological: Does not bruise/bleed easily.  Psychiatric/Behavioral: Negative for confusion. The patient is nervous/anxious.       Allergies  Review of patient's allergies indicates no known allergies.  Home Medications    Prior to Admission medications   Medication Sig Start Date End Date Taking? Authorizing Provider  acetaminophen (TYLENOL) 325 MG tablet Take 2 tablets (650 mg total) by mouth every 4 (four) hours as needed for headache or mild pain. Patient taking differently: Take 325 mg by mouth 2 (two) times daily as needed for headache or mild pain.  05/21/14  Yes Luke K Kilroy, PA-C  albuterol (PROVENTIL HFA;VENTOLIN HFA) 108 (90 BASE) MCG/ACT inhaler Inhale 2 puffs into the lungs every 6 (six) hours as needed for wheezing or shortness of breath.   Yes Historical Provider, MD  ALPRAZolam (XANAX) 0.25 MG tablet Take 0.25 mg by mouth 2 (two) times daily.   Yes Historical Provider, MD  aspirin 81 MG tablet Take 81 mg by mouth daily.   Yes Historical Provider, MD  atorvastatin (LIPITOR) 40 MG tablet Take 1 tablet (40 mg total) by mouth daily at 6 PM. 05/29/13  Yes Zayanna Pundt T Kathlen Mody, PA-C  bismuth subsalicylate (PEPTO BISMOL) 262 MG/15ML suspension Take 30 mLs by mouth every 6 (six) hours as needed for indigestion or diarrhea or loose stools.    Yes Historical Provider, MD  carvedilol (COREG) 3.125 MG tablet TAKE 1 TABLET BY MOUTH TWICE DAILY WITH MEALS. 07/26/15  Yes Lendon Colonel, NP  famotidine (PEPCID) 20 MG tablet Take 1 tablet (20 mg total) by mouth at bedtime. 03/03/15  Yes Rexene Alberts, MD  isosorbide mononitrate (IMDUR) 30 MG 24 hr tablet Take 1.5 tablets (45 mg total) by mouth daily. 03/03/15  Yes Rexene Alberts, MD  levothyroxine (SYNTHROID, LEVOTHROID) 50 MCG tablet Take 50 mcg by mouth daily before breakfast.  09/08/14  Yes Historical Provider, MD  loperamide (IMODIUM) 2 MG capsule Take 1 capsule (2 mg total) by mouth 4 (four) times daily as needed for diarrhea or loose stools. 04/01/15  Yes Kristen N Ward, DO  metFORMIN (GLUCOPHAGE) 500 MG tablet Take 2 tablets (1,000 mg total) by mouth 2 (two) times daily with a meal. RESTART ON Sunday, 03/04/15. Patient taking differently: Take 500 mg by mouth 2 (two)  times daily with a meal.  03/03/15  Yes Rexene Alberts, MD  Multiple Vitamin (MULTIVITAMIN WITH MINERALS) TABS Take 1 tablet by mouth daily.   Yes Historical Provider, MD  ranolazine (RANEXA) 500 MG 12 hr tablet Take 1 tablet (500 mg total) by mouth 2 (two) times daily. 08/20/15  Yes Orlie Dakin, MD  tamsulosin (FLOMAX) 0.4 MG CAPS Take 0.4 mg by mouth 2 (two) times daily.  01/27/13  Yes Historical Provider, MD  ticagrelor (BRILINTA) 60 MG TABS tablet Take 1 tablet (60 mg total) by mouth 2 (two) times daily. Patient taking differently: Take 90 mg by mouth 2 (two) times daily.  08/08/15  Yes Herminio Commons, MD  furosemide (LASIX) 20 MG tablet Take 20 mg by mouth as needed for fluid. Reported on 08/30/2015 05/06/13   Lendon Colonel, NP  NITROSTAT 0.4 MG SL tablet PLACE 1 TAB UNDER TONGUE EVERY 5 MIN IF NEEDED FOR CHEST PAIN. MAY USE 3 TIMES.NO RELIEF CALL 911. 07/04/15   Lendon Colonel, NP   BP 138/76 mmHg  Pulse 90  Temp(Src) 98 F (36.7 C) (Oral)  Resp 18  Ht 5\' 10"  (1.778 m)  Wt 77.111 kg  BMI 24.39 kg/m2  SpO2 100% Physical Exam  Constitutional: He is oriented to person, place, and time. He appears well-developed and well-nourished. No distress.  HENT:  Head: Normocephalic and atraumatic.  Mouth/Throat: Oropharynx is clear and moist.  Eyes: Conjunctivae and EOM are normal. Pupils are equal, round, and reactive to light.  Neck: Normal range of motion. Neck supple.  Cardiovascular: Normal rate.   Pulmonary/Chest: Effort normal and breath sounds normal. No respiratory distress.  Abdominal: Soft. Bowel sounds are normal. There is no tenderness.  Musculoskeletal: Normal range of motion.  Neurological: He is alert and oriented to person, place, and time. No cranial nerve deficit. He exhibits normal muscle tone. Coordination normal.  Skin: Skin is warm.  Nursing note and vitals reviewed.   ED Course  Procedures (including critical care time) Labs Review Labs Reviewed   TROPONIN I  I-STAT TROPOININ, ED    Imaging Review No results found. I have personally reviewed and evaluated these images and lab results as part of my medical decision-making.   EKG Interpretation   Date/Time:  Thursday August 30 2015 13:07:04 EST Ventricular Rate:  73 PR Interval:  197 QRS Duration: 104 QT Interval:  431 QTC Calculation: 475 R Axis:   93 Text Interpretation:  Sinus rhythm Right axis deviation Posterior infarct,  old Nonspecific repol abnormality, diffuse leads Baseline wander in  lead(s) V2 No significant change since last tracing Confirmed by Rally Ouch   MD, Javaun Dimperio [phone]) on 08/30/2015 1:11:30 PM  MDM   Final diagnoses:  Chest pain, unspecified chest pain type      Patient seen by me yesterday. Patient had 6 visits in January for same complaint. This is his fifth visit this month. Patient has anxiety about the chest pain which may be noncardiac. Patient had 2 troponin [place] day that were negative. Today we have had 2 troponins that are negative. And 2 EKGs. Some subtle changes on the first EKG had resolved on the second. Patient stable for discharge home. [place] chest pain is resolved and resolved shortly after arrival to the emergency department.  Fredia Sorrow, MD 08/30/15 [id number]

## 2015-08-30 NOTE — ED Notes (Signed)
Chest pain all morning.

## 2015-08-30 NOTE — Discharge Instructions (Signed)
Workup for the chest pain without any acute findings. Troponins 2 were negative EKGs 2 without any acute findings. Stable for discharge home. Follow up with your doctor.

## 2015-09-08 ENCOUNTER — Emergency Department (HOSPITAL_COMMUNITY)
Admission: EM | Admit: 2015-09-08 | Discharge: 2015-09-08 | Disposition: A | Discharge status: Home / Self Care | Payer: Medicare Other | Attending: Emergency Medicine | Admitting: Emergency Medicine

## 2015-09-08 ENCOUNTER — Encounter (HOSPITAL_COMMUNITY): Payer: Self-pay | Admitting: *Deleted

## 2015-09-08 DIAGNOSIS — E785 Hyperlipidemia, unspecified: Secondary | ICD-10-CM | POA: Diagnosis not present

## 2015-09-08 DIAGNOSIS — I2510 Atherosclerotic heart disease of native coronary artery without angina pectoris: Secondary | ICD-10-CM | POA: Diagnosis not present

## 2015-09-08 DIAGNOSIS — Z87891 Personal history of nicotine dependence: Secondary | ICD-10-CM | POA: Insufficient documentation

## 2015-09-08 DIAGNOSIS — M1990 Unspecified osteoarthritis, unspecified site: Secondary | ICD-10-CM | POA: Insufficient documentation

## 2015-09-08 DIAGNOSIS — Z79899 Other long term (current) drug therapy: Secondary | ICD-10-CM | POA: Diagnosis not present

## 2015-09-08 DIAGNOSIS — Z9861 Coronary angioplasty status: Secondary | ICD-10-CM | POA: Insufficient documentation

## 2015-09-08 DIAGNOSIS — R739 Hyperglycemia, unspecified: Secondary | ICD-10-CM

## 2015-09-08 DIAGNOSIS — Z7984 Long term (current) use of oral hypoglycemic drugs: Secondary | ICD-10-CM | POA: Insufficient documentation

## 2015-09-08 DIAGNOSIS — Z8673 Personal history of transient ischemic attack (TIA), and cerebral infarction without residual deficits: Secondary | ICD-10-CM | POA: Diagnosis not present

## 2015-09-08 DIAGNOSIS — E1165 Type 2 diabetes mellitus with hyperglycemia: Secondary | ICD-10-CM | POA: Insufficient documentation

## 2015-09-08 DIAGNOSIS — R42 Dizziness and giddiness: Secondary | ICD-10-CM | POA: Diagnosis present

## 2015-09-08 DIAGNOSIS — Z951 Presence of aortocoronary bypass graft: Secondary | ICD-10-CM | POA: Diagnosis not present

## 2015-09-08 DIAGNOSIS — E039 Hypothyroidism, unspecified: Secondary | ICD-10-CM | POA: Insufficient documentation

## 2015-09-08 DIAGNOSIS — I10 Essential (primary) hypertension: Secondary | ICD-10-CM | POA: Insufficient documentation

## 2015-09-08 LAB — CBC WITH DIFFERENTIAL/PLATELET
BASOS ABS: 0 10*3/uL (ref 0.0–0.1)
Basophils Relative: 0 %
Eosinophils Absolute: 0.2 10*3/uL (ref 0.0–0.7)
Eosinophils Relative: 3 %
HEMATOCRIT: 37.8 % — AB (ref 39.0–52.0)
Hemoglobin: 12.7 g/dL — ABNORMAL LOW (ref 13.0–17.0)
LYMPHS ABS: 1.2 10*3/uL (ref 0.7–4.0)
LYMPHS PCT: 18 %
MCH: 31.4 pg (ref 26.0–34.0)
MCHC: 33.6 g/dL (ref 30.0–36.0)
MCV: 93.3 fL (ref 78.0–100.0)
MONO ABS: 0.5 10*3/uL (ref 0.1–1.0)
MONOS PCT: 7 %
NEUTROS ABS: 4.8 10*3/uL (ref 1.7–7.7)
Neutrophils Relative %: 72 %
Platelets: 168 10*3/uL (ref 150–400)
RBC: 4.05 MIL/uL — ABNORMAL LOW (ref 4.22–5.81)
RDW: 13 % (ref 11.5–15.5)
WBC: 6.7 10*3/uL (ref 4.0–10.5)

## 2015-09-08 LAB — COMPREHENSIVE METABOLIC PANEL
ALK PHOS: 82 U/L (ref 38–126)
ALT: 21 U/L (ref 17–63)
ANION GAP: 10 (ref 5–15)
AST: 26 U/L (ref 15–41)
Albumin: 3.9 g/dL (ref 3.5–5.0)
BILIRUBIN TOTAL: 0.3 mg/dL (ref 0.3–1.2)
BUN: 15 mg/dL (ref 6–20)
CALCIUM: 9.4 mg/dL (ref 8.9–10.3)
CO2: 23 mmol/L (ref 22–32)
CREATININE: 0.87 mg/dL (ref 0.61–1.24)
Chloride: 105 mmol/L (ref 101–111)
GFR calc non Af Amer: >60 mL/min (ref >60)
Glucose, Bld: 172 mg/dL — ABNORMAL HIGH (ref 65–99)
Potassium: 4.4 mmol/L (ref 3.5–5.1)
Sodium: 138 mmol/L (ref 135–145)
TOTAL PROTEIN: 6.7 g/dL (ref 6.5–8.1)

## 2015-09-08 LAB — TROPONIN I
Troponin I: <0.03 ng/mL (ref <0.031)
Troponin I: <0.03 ng/mL (ref <0.031)

## 2015-09-08 LAB — CBG MONITORING, ED: Glucose-Capillary: 143 mg/dL — ABNORMAL HIGH (ref 65–99)

## 2015-09-08 NOTE — ED Provider Notes (Signed)
CSN: [id number]     Arrival date & time 09/08/15  1013 History  By signing my name below, I, Altamease Oiler, attest that this documentation has been prepared under the direction and in the presence of Ezequiel Essex, MD. Electronically Signed: Altamease Oiler, ED Scribe. 09/08/2015. 11:12 AM   Chief Complaint  Patient presents with  . Blood Sugar Problem    The history is provided by the patient. No language interpreter was used.   Jeffrey [name] is a 82 y.o. male with history of DM, HTN, HLD, CAD s/p CABG, hypothyroidism, and stroke who presents to the Emergency Department complaining of an episode of light headedness and generalized weakness with onset this morning. Pt states that after he ate breakfast and took his metformin his blood sugar dropped from 300 to 189 with a glucose tablet in his mouth. His symptoms have resolved and he feels normal at present. Pt denies chest pain, SOB, abdominal pain, nausea, and vomiting. He has been eating and drinking as usual.  Pt states that he typically takes 1 tablet of metformin twice a day.   Past Medical History  Diagnosis Date  . Diabetes mellitus, type II ([place])   . Hyperlipidemia   . Coronary atherosclerosis of native coronary artery     a. CABG x 4 in 1989 (VG->OM1->OM2, VG->RCA, LIMA->LAD), b. 05/2010: DES to VG-OM1/OM2, DES to distal LCx. c. NSTEMI in 04/2011 - TO distal LCX stent and VG->OM2. d. 02/2012 NSTEMI DES to VG-OM1/continuation to OM2 occluded. e. inferior STEMI s/p DES to SVG-RAMUS 06/2012. f. inferolat STEMI 09/2012 s/p DES to SVG-interm; g. Lex MV (11/14):  EF 35%, inf-lat scar with small peri-infarct ischemia  . Essential hypertension, benign   . Osteoarthritis   . History of stroke   . History of pneumonia   . Cervical vertebral fracture ([place])   . Chronic back pain   . Benign prostatic hypertrophy     History of urinary retention  . Peptic ulcer disease   . Gastroesophageal reflux disease   . Ischemic cardiomyopathy Nov  2015    EF 35% cath, 45-50% by echo  . Hypothyroidism   . Anxiety about health     "multiple somatic complaints"  . Chronic chest pain    Past Surgical History  Procedure Laterality Date  . Tonsillectomy    . Coronary artery bypass graft  1989  . Coronary angioplasty  10/12, 8/13, 12/13, 3/14    SVG-OM PCI  . Cardiac catheterization  05/19/14    SVG-OM occl- medical Rx  . Left heart catheterization with coronary angiogram N/A 11/01/2011    Procedure: LEFT HEART CATHETERIZATION WITH CORONARY ANGIOGRAM;  Surgeon: Lorretta Harp, MD;  Location: [facility] CATH LAB;  Service: Cardiovascular;  Laterality: N/A;  . Percutaneous coronary stent intervention (pci-s) N/A 11/01/2011    Procedure: PERCUTANEOUS CORONARY STENT INTERVENTION (PCI-S);  Surgeon: Lorretta Harp, MD;  Location: [facility] CATH LAB;  Service: Cardiovascular;  Laterality: N/A;  . Left heart catheterization with coronary/graft angiogram N/A 03/10/2012    Procedure: LEFT HEART CATHETERIZATION WITH [name];  Surgeon: Sherren Mocha, MD;  Location: [facility] CATH LAB;  Service: Cardiovascular;  Laterality: N/A;  . Left heart catheterization with coronary angiogram N/A 06/18/2012    Procedure: LEFT HEART CATHETERIZATION WITH CORONARY ANGIOGRAM;  Surgeon: Peter M Martinique, MD;  Location: [facility] CATH LAB;  Service: Cardiovascular;  Laterality: N/A;  . Percutaneous coronary stent intervention (pci-s)  06/18/2012    Procedure: PERCUTANEOUS CORONARY STENT INTERVENTION (PCI-S);  Surgeon: Collier Salina  M Martinique, MD;  Location: [facility] CATH LAB;  Service: Cardiovascular;;  . Left heart catheterization with coronary/graft angiogram  10/06/2012    Procedure: LEFT HEART CATHETERIZATION WITH [name];  Surgeon: Burnell Blanks, MD;  Location: [facility] CATH LAB;  Service: Cardiovascular;;  . Percutaneous coronary stent intervention (pci-s)  10/06/2012    Procedure: PERCUTANEOUS CORONARY STENT INTERVENTION (PCI-S);  Surgeon: Burnell Blanks, MD;   Location: [facility] CATH LAB;  Service: Cardiovascular;;  . Left heart catheterization with coronary/graft angiogram N/A 11/09/2012    Procedure: LEFT HEART CATHETERIZATION WITH [name];  Surgeon: Peter M Martinique, MD;  Location: [facility] CATH LAB;  Service: Cardiovascular;  Laterality: N/A;  . Left heart catheterization with coronary/graft angiogram N/A 05/19/2014    Procedure: LEFT HEART CATHETERIZATION WITH [name];  Surgeon: Troy Sine, MD;  Location: [facility] CATH LAB;  Service: Cardiovascular;  Laterality: N/A;   Family History  Problem Relation Age of Onset  . Early death      Parents died young  . Appendicitis Mother     Pt was 42 year old  . Heart attack Father 79   Social History  Substance Use Topics  . Smoking status: Former Smoker -- 2.00 packs/day for 10 years    Types: Cigarettes    Start date: 07/14/1950    Quit date: 07/14/1961  . Smokeless tobacco: Current User    Types: Chew  . Alcohol Use: No    Review of Systems  10 Systems reviewed and all are negative for acute change except as noted in the HPI.  Allergies  Review of patient's allergies indicates no known allergies.  Home Medications   Prior to Admission medications   Medication Sig Start Date End Date Taking? Authorizing Provider  acetaminophen (TYLENOL) 325 MG tablet Take 2 tablets (650 mg total) by mouth every 4 (four) hours as needed for headache or mild pain. Patient taking differently: Take 325 mg by mouth 2 (two) times daily as needed for headache or mild pain.  05/21/14  Yes Luke K Kilroy, PA-C  albuterol (PROVENTIL HFA;VENTOLIN HFA) 108 (90 BASE) MCG/ACT inhaler Inhale 2 puffs into the lungs every 6 (six) hours as needed for wheezing or shortness of breath.   Yes Historical Provider, MD  ALPRAZolam (XANAX) 0.25 MG tablet Take 0.25 mg by mouth 2 (two) times daily.   Yes Historical Provider, MD  aspirin 81 MG tablet Take 81 mg by mouth daily.   Yes Historical Provider, MD   atorvastatin (LIPITOR) 40 MG tablet Take 1 tablet (40 mg total) by mouth daily at 6 PM. 05/29/13  Yes Scott T Kathlen Mody, PA-C  bismuth subsalicylate (PEPTO BISMOL) 262 MG/15ML suspension Take 30 mLs by mouth every 6 (six) hours as needed for indigestion or diarrhea or loose stools.    Yes Historical Provider, MD  carvedilol (COREG) 3.125 MG tablet TAKE 1 TABLET BY MOUTH TWICE DAILY WITH MEALS. 07/26/15  Yes Lendon Colonel, NP  famotidine (PEPCID) 20 MG tablet Take 1 tablet (20 mg total) by mouth at bedtime. 03/03/15  Yes Rexene Alberts, MD  furosemide (LASIX) 20 MG tablet Take 20 mg by mouth as needed for fluid. Reported on 08/30/2015 05/06/13  Yes Lendon Colonel, NP  isosorbide mononitrate (IMDUR) 30 MG 24 hr tablet Take 1.5 tablets (45 mg total) by mouth daily. 03/03/15  Yes Rexene Alberts, MD  levothyroxine (SYNTHROID, LEVOTHROID) 50 MCG tablet Take 50 mcg by mouth daily before breakfast.  09/08/14  Yes Historical Provider, MD  loperamide (  IMODIUM) 2 MG capsule Take 1 capsule (2 mg total) by mouth 4 (four) times daily as needed for diarrhea or loose stools. 04/01/15  Yes Kristen N Ward, DO  metFORMIN (GLUCOPHAGE) 500 MG tablet Take 2 tablets (1,000 mg total) by mouth 2 (two) times daily with a meal. RESTART ON Sunday, 03/04/15. Patient taking differently: Take 500 mg by mouth 2 (two) times daily with a meal.  03/03/15  Yes Rexene Alberts, MD  Multiple Vitamin (MULTIVITAMIN WITH MINERALS) TABS Take 1 tablet by mouth daily.   Yes Historical Provider, MD  NITROSTAT 0.4 MG SL tablet PLACE 1 TAB UNDER TONGUE EVERY 5 MIN IF NEEDED FOR CHEST PAIN. MAY USE 3 TIMES.NO RELIEF CALL 911. 07/04/15  Yes Lendon Colonel, NP  ranolazine (RANEXA) 500 MG 12 hr tablet Take 1 tablet (500 mg total) by mouth 2 (two) times daily. 08/20/15  Yes Orlie Dakin, MD  tamsulosin (FLOMAX) 0.4 MG CAPS Take 0.4 mg by mouth 2 (two) times daily.  01/27/13  Yes Historical Provider, MD  ticagrelor (BRILINTA) 60 MG TABS tablet Take 1  tablet (60 mg total) by mouth 2 (two) times daily. Patient taking differently: Take 90 mg by mouth 2 (two) times daily.  08/08/15  Yes Herminio Commons, MD   BP 140/75 mmHg  Pulse 85  Temp(Src) 98.9 F (37.2 C) (Oral)  Resp 16  Wt 170 lb (77.111 kg)  SpO2 99% Physical Exam  Constitutional: He is oriented to person, place, and time. He appears well-developed and well-nourished. No distress.  HENT:  Head: Normocephalic and atraumatic.  Mouth/Throat: Oropharynx is clear and moist. No oropharyngeal exudate.  Ulcerative lesion to left lip as previously  Eyes: Conjunctivae and EOM are normal. Pupils are equal, round, and reactive to light.  Neck: Normal range of motion. Neck supple.  No meningismus.  Cardiovascular: Normal rate, regular rhythm, normal heart sounds and intact distal pulses.   No murmur heard. Pulmonary/Chest: Effort normal and breath sounds normal. No respiratory distress.  Abdominal: Soft. There is no tenderness. There is no rebound and no guarding.  Musculoskeletal: Normal range of motion. He exhibits no edema or tenderness.  Neurological: He is alert and oriented to person, place, and time. No cranial nerve deficit. He exhibits normal muscle tone. Coordination normal.  Cranial nerves II-XII intact Left leg weakness at baseline, otherwise 5/5 strength throughout  Skin: Skin is warm.  Psychiatric: He has a normal mood and affect. His behavior is normal.  Nursing note and vitals reviewed.   ED Course  Procedures (including critical care time) DIAGNOSTIC STUDIES: Oxygen Saturation is 99% on RA,  normal by my interpretation.    COORDINATION OF CARE: 11:07 AM Discussed treatment plan which includes lab work and EKG with pt at bedside and pt agreed to plan.  Labs Review Labs Reviewed  CBC WITH DIFFERENTIAL/PLATELET - Abnormal; Notable for the following:    RBC 4.05 (*)    Hemoglobin 12.7 (*)    HCT 37.8 (*)    All other components within normal limits   COMPREHENSIVE METABOLIC PANEL - Abnormal; Notable for the following:    Glucose, Bld 172 (*)    All other components within normal limits  CBG MONITORING, ED - Abnormal; Notable for the following:    Glucose-Capillary 143 (*)    All other components within normal limits  TROPONIN I  TROPONIN I  CBG MONITORING, ED    Imaging Review No results found. I have personally reviewed and evaluated these lab results as  part of my medical decision-making.   EKG Interpretation   Date/Time:  Saturday September 08 2015 10:44:34 EST Ventricular Rate:  79 PR Interval:  188 QRS Duration: 108 QT Interval:  381 QTC Calculation: 437 R Axis:   97 Text Interpretation:  Sinus rhythm Ventricular premature complex  Inferoposterior infarct, age indeterminate Lateral leads are also involved  No significant change was found Confirmed by Wyvonnia Dusky  MD, Tenzin Pavon [phone])  on 09/08/2015 11:19:04 AM      MDM   Final diagnoses:  Elevated blood sugar   Patient well known to the ED with recurrent visits for chest pain. Denies any chest pain today. States became concerned when his blood sugar dropped after taking metformin. Blood sugar 147 on arrival. Endorses some lightheadedness and weakness earlier which has since resolved. Feels back to baseline. He is tolerating by mouth.  EKG is unchanged.  Patient denies any chest pain. Troponin is negative. Blood sugar 172 on recheck, patient is given a meal.  Patient's blood sugar has stabilized in the ED. Recheck 143. He is tolerating a meal. Denies any chest pain, abdominal pain, shortness of breath, nausea or vomiting.  Continue to monitor his blood sugars at home. Follow up with his doctor. Return precautions discussed.   I personally performed the services described in this documentation, which was scribed in my presence. The recorded information has been reviewed and is accurate.   Ezequiel Essex, MD 09/08/15 [phone]

## 2015-09-08 NOTE — ED Notes (Signed)
Called for meal tray x 2. Called additional time and staff stated they would bring something up. Pt to go to family room at time of discharge for meal prior to leaving.

## 2015-09-08 NOTE — ED Notes (Signed)
Pt. Reports eating breakfast and took his metformin. States he began to feel bad and checked his blood sugar and it had dropped from 300 to 189. Pt in nad, alert and oriented. CBG 175 in triage.

## 2015-09-08 NOTE — Discharge Instructions (Signed)
Hyperglycemia Monitor your blood sugars as discussed. Followup with your doctor. Return to the ED if you develop new or worsening symptoms. Hyperglycemia occurs when the glucose (sugar) in your blood is too high. Hyperglycemia can happen for many reasons, but it most often happens to people who do not know they have diabetes or are not managing their diabetes properly.  CAUSES  Whether you have diabetes or not, there are other causes of hyperglycemia. Hyperglycemia can occur when you have diabetes, but it can also occur in other situations that you might not be as aware of, such as: Diabetes  If you have diabetes and are having problems controlling your blood glucose, hyperglycemia could occur because of some of the following reasons:  Not following your meal plan.  Not taking your diabetes medications or not taking it properly.  Exercising less or doing less activity than you normally do.  Being sick. Pre-diabetes  This cannot be ignored. Before people develop Type 2 diabetes, they almost always have "pre-diabetes." This is when your blood glucose levels are higher than normal, but not yet high enough to be diagnosed as diabetes. Research has shown that some long-term damage to the body, especially the heart and circulatory system, may already be occurring during pre-diabetes. If you take action to manage your blood glucose when you have pre-diabetes, you may delay or prevent Type 2 diabetes from developing. Stress  If you have diabetes, you may be "diet" controlled or on oral medications or insulin to control your diabetes. However, you may find that your blood glucose is higher than usual in the hospital whether you have diabetes or not. This is often referred to as "stress hyperglycemia." Stress can elevate your blood glucose. This happens because of hormones put out by the body during times of stress. If stress has been the cause of your high blood glucose, it can be followed regularly by  your caregiver. That way he/she can make sure your hyperglycemia does not continue to get worse or progress to diabetes. Steroids  Steroids are medications that act on the infection fighting system (immune system) to block inflammation or infection. One side effect can be a rise in blood glucose. Most people can produce enough extra insulin to allow for this rise, but for those who cannot, steroids make blood glucose levels go even higher. It is not unusual for steroid treatments to "uncover" diabetes that is developing. It is not always possible to determine if the hyperglycemia will go away after the steroids are stopped. A special blood test called an A1c is sometimes done to determine if your blood glucose was elevated before the steroids were started. SYMPTOMS  Thirsty.  Frequent urination.  Dry mouth.  Blurred vision.  Tired or fatigue.  Weakness.  Sleepy.  Tingling in feet or leg. DIAGNOSIS  Diagnosis is made by monitoring blood glucose in one or all of the following ways:  A1c test. This is a chemical found in your blood.  Fingerstick blood glucose monitoring.  Laboratory results. TREATMENT  First, knowing the cause of the hyperglycemia is important before the hyperglycemia can be treated. Treatment may include, but is not be limited to:  Education.  Change or adjustment in medications.  Change or adjustment in meal plan.  Treatment for an illness, infection, etc.  More frequent blood glucose monitoring.  Change in exercise plan.  Decreasing or stopping steroids.  Lifestyle changes. HOME CARE INSTRUCTIONS   Test your blood glucose as directed.  Exercise regularly. Your caregiver  will give you instructions about exercise. Pre-diabetes or diabetes which comes on with stress is helped by exercising.  Eat wholesome, balanced meals. Eat often and at regular, fixed times. Your caregiver or nutritionist will give you a meal plan to guide your sugar  intake.  Being at an ideal weight is important. If needed, losing as little as 10 to 15 pounds may help improve blood glucose levels. SEEK MEDICAL CARE IF:   You have questions about medicine, activity, or diet.  You continue to have symptoms (problems such as increased thirst, urination, or weight gain). SEEK IMMEDIATE MEDICAL CARE IF:   You are vomiting or have diarrhea.  Your breath smells fruity.  You are breathing faster or slower.  You are very sleepy or incoherent.  You have numbness, tingling, or pain in your feet or hands.  You have chest pain.  Your symptoms get worse even though you have been following your caregiver's orders.  If you have any other questions or concerns.   This information is not intended to replace advice given to you by your health care provider. Make sure you discuss any questions you have with your health care provider.   Document Released: 12/24/2000 Document Revised: 09/22/2011 Document Reviewed: 03/06/2015 Elsevier Interactive Patient Education [employer].

## 2015-09-10 LAB — CBG MONITORING, ED: Glucose-Capillary: 175 mg/dL — ABNORMAL HIGH (ref 65–99)

## 2015-09-19 ENCOUNTER — Encounter (HOSPITAL_COMMUNITY): Payer: Self-pay | Admitting: Emergency Medicine

## 2015-09-19 ENCOUNTER — Emergency Department (HOSPITAL_COMMUNITY)
Admission: EM | Admit: 2015-09-19 | Discharge: 2015-09-19 | Disposition: A | Discharge status: Home / Self Care | Payer: Medicare Other | Attending: Emergency Medicine | Admitting: Emergency Medicine

## 2015-09-19 DIAGNOSIS — Z7984 Long term (current) use of oral hypoglycemic drugs: Secondary | ICD-10-CM | POA: Diagnosis not present

## 2015-09-19 DIAGNOSIS — E039 Hypothyroidism, unspecified: Secondary | ICD-10-CM | POA: Insufficient documentation

## 2015-09-19 DIAGNOSIS — Z8673 Personal history of transient ischemic attack (TIA), and cerebral infarction without residual deficits: Secondary | ICD-10-CM | POA: Diagnosis not present

## 2015-09-19 DIAGNOSIS — Z87891 Personal history of nicotine dependence: Secondary | ICD-10-CM | POA: Insufficient documentation

## 2015-09-19 DIAGNOSIS — M1990 Unspecified osteoarthritis, unspecified site: Secondary | ICD-10-CM | POA: Insufficient documentation

## 2015-09-19 DIAGNOSIS — I2510 Atherosclerotic heart disease of native coronary artery without angina pectoris: Secondary | ICD-10-CM | POA: Diagnosis not present

## 2015-09-19 DIAGNOSIS — E119 Type 2 diabetes mellitus without complications: Secondary | ICD-10-CM | POA: Diagnosis not present

## 2015-09-19 DIAGNOSIS — Z79899 Other long term (current) drug therapy: Secondary | ICD-10-CM | POA: Diagnosis not present

## 2015-09-19 DIAGNOSIS — Z7902 Long term (current) use of antithrombotics/antiplatelets: Secondary | ICD-10-CM | POA: Diagnosis not present

## 2015-09-19 DIAGNOSIS — Z7982 Long term (current) use of aspirin: Secondary | ICD-10-CM | POA: Insufficient documentation

## 2015-09-19 DIAGNOSIS — R072 Precordial pain: Secondary | ICD-10-CM | POA: Diagnosis not present

## 2015-09-19 DIAGNOSIS — I10 Essential (primary) hypertension: Secondary | ICD-10-CM | POA: Diagnosis not present

## 2015-09-19 DIAGNOSIS — E785 Hyperlipidemia, unspecified: Secondary | ICD-10-CM | POA: Diagnosis not present

## 2015-09-19 DIAGNOSIS — R079 Chest pain, unspecified: Secondary | ICD-10-CM

## 2015-09-19 LAB — COMPREHENSIVE METABOLIC PANEL
ALBUMIN: 3.9 g/dL (ref 3.5–5.0)
ALT: 26 U/L (ref 17–63)
ANION GAP: 9 (ref 5–15)
AST: 26 U/L (ref 15–41)
Alkaline Phosphatase: 81 U/L (ref 38–126)
BUN: 13 mg/dL (ref 6–20)
CHLORIDE: 106 mmol/L (ref 101–111)
CO2: 24 mmol/L (ref 22–32)
Calcium: 9.1 mg/dL (ref 8.9–10.3)
Creatinine, Ser: 0.81 mg/dL (ref 0.61–1.24)
GFR calc Af Amer: >60 mL/min (ref >60)
GFR calc non Af Amer: >60 mL/min (ref >60)
GLUCOSE: 158 mg/dL — AB (ref 65–99)
POTASSIUM: 4 mmol/L (ref 3.5–5.1)
SODIUM: 139 mmol/L (ref 135–145)
Total Bilirubin: 0.5 mg/dL (ref 0.3–1.2)
Total Protein: 6.6 g/dL (ref 6.5–8.1)

## 2015-09-19 LAB — HEMOGLOBIN A1C: Hemoglobin A1C: 8.2

## 2015-09-19 LAB — CBC
HEMATOCRIT: 39.2 % (ref 39.0–52.0)
HEMOGLOBIN: 13.1 g/dL (ref 13.0–17.0)
MCH: 31.4 pg (ref 26.0–34.0)
MCHC: 33.4 g/dL (ref 30.0–36.0)
MCV: 94 fL (ref 78.0–100.0)
Platelets: 162 10*3/uL (ref 150–400)
RBC: 4.17 MIL/uL — ABNORMAL LOW (ref 4.22–5.81)
RDW: 12.9 % (ref 11.5–15.5)
WBC: 6.8 10*3/uL (ref 4.0–10.5)

## 2015-09-19 LAB — TROPONIN I
Troponin I: <0.03 ng/mL (ref <0.031)
Troponin I: <0.03 ng/mL (ref <0.031)

## 2015-09-19 NOTE — ED Notes (Signed)
Pt states he was having chest pain earlier today and took a bunch of pepto bismol and is now pain free.  States he figured he needed to be checked again.

## 2015-09-19 NOTE — ED Provider Notes (Signed)
CSN: [id number]     Arrival date & time 09/19/15  1515 History   First MD Initiated Contact with Patient 09/19/15 1843     Chief Complaint  Patient presents with  . Chest Pain     (Consider location/radiation/quality/duration/timing/severity/associated sxs/prior Treatment) Patient is a 82 y.o. male presenting with chest pain.  Chest Pain Pain location:  Substernal area Pain quality: hot and sharp   Pain radiates to:  Does not radiate Pain radiates to the back: no   Pain severity:  Mild Progression:  Resolved Chronicity:  Chronic Context: not breathing and no drug use   Relieved by:  None tried Worsened by:  Nothing tried Ineffective treatments:  None tried Associated symptoms: no cough, no fatigue, no fever, no nausea, no orthopnea, no PND, no shortness of breath and not vomiting     Past Medical History  Diagnosis Date  . Diabetes mellitus, type II ([place])   . Hyperlipidemia   . Coronary atherosclerosis of native coronary artery     a. CABG x 4 in 1989 (VG->OM1->OM2, VG->RCA, LIMA->LAD), b. 05/2010: DES to VG-OM1/OM2, DES to distal LCx. c. NSTEMI in 04/2011 - TO distal LCX stent and VG->OM2. d. 02/2012 NSTEMI DES to VG-OM1/continuation to OM2 occluded. e. inferior STEMI s/p DES to SVG-RAMUS 06/2012. f. inferolat STEMI 09/2012 s/p DES to SVG-interm; g. Lex MV (11/14):  EF 35%, inf-lat scar with small peri-infarct ischemia  . Essential hypertension, benign   . Osteoarthritis   . History of stroke   . History of pneumonia   . Cervical vertebral fracture ([place])   . Chronic back pain   . Benign prostatic hypertrophy     History of urinary retention  . Peptic ulcer disease   . Gastroesophageal reflux disease   . Ischemic cardiomyopathy Nov 2015    EF 35% cath, 45-50% by echo  . Hypothyroidism   . Anxiety about health     "multiple somatic complaints"  . Chronic chest pain    Past Surgical History  Procedure Laterality Date  . Tonsillectomy    . Coronary artery bypass graft   1989  . Coronary angioplasty  10/12, 8/13, 12/13, 3/14    SVG-OM PCI  . Cardiac catheterization  05/19/14    SVG-OM occl- medical Rx  . Left heart catheterization with coronary angiogram N/A 11/01/2011    Procedure: LEFT HEART CATHETERIZATION WITH CORONARY ANGIOGRAM;  Surgeon: Lorretta Harp, MD;  Location: [facility] CATH LAB;  Service: Cardiovascular;  Laterality: N/A;  . Percutaneous coronary stent intervention (pci-s) N/A 11/01/2011    Procedure: PERCUTANEOUS CORONARY STENT INTERVENTION (PCI-S);  Surgeon: Lorretta Harp, MD;  Location: [facility] CATH LAB;  Service: Cardiovascular;  Laterality: N/A;  . Left heart catheterization with coronary/graft angiogram N/A 03/10/2012    Procedure: LEFT HEART CATHETERIZATION WITH [name];  Surgeon: Sherren Mocha, MD;  Location: [facility] CATH LAB;  Service: Cardiovascular;  Laterality: N/A;  . Left heart catheterization with coronary angiogram N/A 06/18/2012    Procedure: LEFT HEART CATHETERIZATION WITH CORONARY ANGIOGRAM;  Surgeon: Peter M Martinique, MD;  Location: [facility] CATH LAB;  Service: Cardiovascular;  Laterality: N/A;  . Percutaneous coronary stent intervention (pci-s)  06/18/2012    Procedure: PERCUTANEOUS CORONARY STENT INTERVENTION (PCI-S);  Surgeon: Peter M Martinique, MD;  Location: [facility] CATH LAB;  Service: Cardiovascular;;  . Left heart catheterization with coronary/graft angiogram  10/06/2012    Procedure: LEFT HEART CATHETERIZATION WITH [name];  Surgeon: Burnell Blanks, MD;  Location: [facility] CATH LAB;  Service: Cardiovascular;;  .  Percutaneous coronary stent intervention (pci-s)  10/06/2012    Procedure: PERCUTANEOUS CORONARY STENT INTERVENTION (PCI-S);  Surgeon: Burnell Blanks, MD;  Location: [facility] CATH LAB;  Service: Cardiovascular;;  . Left heart catheterization with coronary/graft angiogram N/A 11/09/2012    Procedure: LEFT HEART CATHETERIZATION WITH [name];  Surgeon: Peter M Martinique, MD;  Location: [facility] CATH LAB;   Service: Cardiovascular;  Laterality: N/A;  . Left heart catheterization with coronary/graft angiogram N/A 05/19/2014    Procedure: LEFT HEART CATHETERIZATION WITH [name];  Surgeon: Troy Sine, MD;  Location: [facility] CATH LAB;  Service: Cardiovascular;  Laterality: N/A;   Family History  Problem Relation Age of Onset  . Early death      Parents died young  . Appendicitis Mother     Pt was 32 year old  . Heart attack Father 46   Social History  Substance Use Topics  . Smoking status: Former Smoker -- 2.00 packs/day for 10 years    Types: Cigarettes    Start date: 07/14/1950    Quit date: 07/14/1961  . Smokeless tobacco: Current User    Types: Chew  . Alcohol Use: No    Review of Systems  Constitutional: Negative for fever and fatigue.  Eyes: Negative for photophobia and pain.  Respiratory: Negative for cough and shortness of breath.   Cardiovascular: Positive for chest pain. Negative for orthopnea and PND.  Gastrointestinal: Negative for nausea and vomiting.  All other systems reviewed and are negative.     Allergies  Review of patient's allergies indicates no known allergies.  Home Medications   Prior to Admission medications   Medication Sig Start Date End Date Taking? Authorizing Provider  ALPRAZolam (XANAX) 0.25 MG tablet Take 0.25 mg by mouth 2 (two) times daily.   Yes Historical Provider, MD  aspirin EC 81 MG tablet Take 81 mg by mouth daily.   Yes Historical Provider, MD  atorvastatin (LIPITOR) 40 MG tablet Take 1 tablet (40 mg total) by mouth daily at 6 PM. 05/29/13  Yes Scott T Kathlen Mody, PA-C  bismuth subsalicylate (PEPTO BISMOL) 262 MG/15ML suspension Take 30 mLs by mouth every 6 (six) hours as needed for indigestion or diarrhea or loose stools.    Yes Historical Provider, MD  carvedilol (COREG) 3.125 MG tablet TAKE 1 TABLET BY MOUTH TWICE DAILY WITH MEALS. 07/26/15  Yes Lendon Colonel, NP  famotidine (PEPCID) 20 MG tablet Take 1 tablet (20 mg  total) by mouth at bedtime. 03/03/15  Yes Rexene Alberts, MD  isosorbide mononitrate (IMDUR) 30 MG 24 hr tablet Take 1.5 tablets (45 mg total) by mouth daily. 03/03/15  Yes Rexene Alberts, MD  levothyroxine (SYNTHROID, LEVOTHROID) 50 MCG tablet Take 25 mcg by mouth daily before breakfast.  09/08/14  Yes Historical Provider, MD  metFORMIN (GLUCOPHAGE) 500 MG tablet Take 2 tablets (1,000 mg total) by mouth 2 (two) times daily with a meal. RESTART ON Sunday, 03/04/15. Patient taking differently: Take 500 mg by mouth 2 (two) times daily with a meal.  03/03/15  Yes Rexene Alberts, MD  Multiple Vitamin (MULTIVITAMIN WITH MINERALS) TABS Take 1 tablet by mouth daily.   Yes Historical Provider, MD  ranolazine (RANEXA) 500 MG 12 hr tablet Take 1 tablet (500 mg total) by mouth 2 (two) times daily. 08/20/15  Yes Orlie Dakin, MD  tamsulosin (FLOMAX) 0.4 MG CAPS Take 0.4 mg by mouth 2 (two) times daily.  01/27/13  Yes Historical Provider, MD  ticagrelor (BRILINTA) 60 MG TABS tablet Take 1  tablet (60 mg total) by mouth 2 (two) times daily. 08/08/15  Yes Herminio Commons, MD  acetaminophen (TYLENOL) 325 MG tablet Take 2 tablets (650 mg total) by mouth every 4 (four) hours as needed for headache or mild pain. Patient taking differently: Take 325 mg by mouth 2 (two) times daily as needed for headache or mild pain.  05/21/14   Erlene Quan, PA-C  albuterol (PROVENTIL HFA;VENTOLIN HFA) 108 (90 BASE) MCG/ACT inhaler Inhale 2 puffs into the lungs every 6 (six) hours as needed for wheezing or shortness of breath.    Historical Provider, MD  furosemide (LASIX) 20 MG tablet Take 20 mg by mouth as needed for fluid. Reported on 08/30/2015 05/06/13   Lendon Colonel, NP  loperamide (IMODIUM) 2 MG capsule Take 1 capsule (2 mg total) by mouth 4 (four) times daily as needed for diarrhea or loose stools. 04/01/15   Kristen N Ward, DO  NITROSTAT 0.4 MG SL tablet PLACE 1 TAB UNDER TONGUE EVERY 5 MIN IF NEEDED FOR CHEST PAIN. MAY USE 3  TIMES.NO RELIEF CALL 911. 07/04/15   Lendon Colonel, NP   BP 142/71 mmHg  Pulse 70  Temp(Src) 98.6 F (37 C) (Oral)  Resp 18  Ht 5\' 10"  (1.778 m)  Wt 170 lb (77.111 kg)  BMI 24.39 kg/m2  SpO2 98% Physical Exam  Constitutional: He is oriented to person, place, and time. He appears well-developed and well-nourished.  HENT:  Head: Normocephalic and atraumatic.  Eyes: Pupils are equal, round, and reactive to light.  Neck: Normal range of motion.  Cardiovascular: Normal rate and regular rhythm.   Pulmonary/Chest: Effort normal. No respiratory distress.  Abdominal: Soft. He exhibits no distension. There is no tenderness. There is no rebound.  Musculoskeletal: Normal range of motion. He exhibits no edema or tenderness.  Neurological: He is alert and oriented to person, place, and time. No cranial nerve deficit. Coordination normal.  Skin: Skin is warm and dry.  Nursing note and vitals reviewed.   ED Course  Procedures (including critical care time) Labs Review Labs Reviewed  CBC - Abnormal; Notable for the following:    RBC 4.17 (*)    All other components within normal limits  COMPREHENSIVE METABOLIC PANEL - Abnormal; Notable for the following:    Glucose, Bld 158 (*)    All other components within normal limits  TROPONIN I  TROPONIN I    Imaging Review No results found. I have personally reviewed and evaluated these images and lab results as part of my medical decision-making.   EKG Interpretation   Date/Time:  Wednesday September 19 2015 20:45:23 EST Ventricular Rate:  69 PR Interval:  222 QRS Duration: 107 QT Interval:  427 QTC Calculation: 457 R Axis:   84 Text Interpretation:  Sinus rhythm Prolonged PR interval Borderline right  axis deviation Borderline repolarization abnormality ED PHYSICIAN  INTERPRETATION AVAILABLE IN [facility] Confirmed by TEST, Record  ([id number]) on 09/20/2015 8:15:04 AM      MDM   Final diagnoses:  Chest pain, unspecified chest  pain type   Atypical chest pain, resolved now. Delta troponins normal. Delta ECG's without evolution of st or t wave changes. Likely his chronic atypical chest pain, recognizing he has real disease I don't think this is cardiac in nature.      Merrily Pew, MD 09/21/15 [phone]

## 2015-09-26 ENCOUNTER — Encounter: Payer: Self-pay | Admitting: Gastroenterology

## 2015-09-29 ENCOUNTER — Emergency Department (HOSPITAL_COMMUNITY)
Admission: EM | Admit: 2015-09-29 | Discharge: 2015-09-29 | Disposition: A | Discharge status: Home / Self Care | Payer: Medicare Other | Attending: Emergency Medicine | Admitting: Emergency Medicine

## 2015-09-29 ENCOUNTER — Emergency Department (HOSPITAL_COMMUNITY): Payer: Medicare Other

## 2015-09-29 ENCOUNTER — Encounter (HOSPITAL_COMMUNITY): Payer: Self-pay | Admitting: Emergency Medicine

## 2015-09-29 DIAGNOSIS — Z7982 Long term (current) use of aspirin: Secondary | ICD-10-CM | POA: Insufficient documentation

## 2015-09-29 DIAGNOSIS — E119 Type 2 diabetes mellitus without complications: Secondary | ICD-10-CM | POA: Diagnosis not present

## 2015-09-29 DIAGNOSIS — I10 Essential (primary) hypertension: Secondary | ICD-10-CM | POA: Diagnosis not present

## 2015-09-29 DIAGNOSIS — Z79899 Other long term (current) drug therapy: Secondary | ICD-10-CM | POA: Diagnosis not present

## 2015-09-29 DIAGNOSIS — E785 Hyperlipidemia, unspecified: Secondary | ICD-10-CM | POA: Diagnosis not present

## 2015-09-29 DIAGNOSIS — R072 Precordial pain: Secondary | ICD-10-CM | POA: Diagnosis not present

## 2015-09-29 DIAGNOSIS — F419 Anxiety disorder, unspecified: Secondary | ICD-10-CM | POA: Diagnosis not present

## 2015-09-29 DIAGNOSIS — I2510 Atherosclerotic heart disease of native coronary artery without angina pectoris: Secondary | ICD-10-CM | POA: Diagnosis not present

## 2015-09-29 DIAGNOSIS — E039 Hypothyroidism, unspecified: Secondary | ICD-10-CM | POA: Insufficient documentation

## 2015-09-29 DIAGNOSIS — F418 Other specified anxiety disorders: Secondary | ICD-10-CM

## 2015-09-29 DIAGNOSIS — Z8673 Personal history of transient ischemic attack (TIA), and cerebral infarction without residual deficits: Secondary | ICD-10-CM | POA: Insufficient documentation

## 2015-09-29 DIAGNOSIS — Z7984 Long term (current) use of oral hypoglycemic drugs: Secondary | ICD-10-CM | POA: Diagnosis not present

## 2015-09-29 DIAGNOSIS — Z87891 Personal history of nicotine dependence: Secondary | ICD-10-CM | POA: Insufficient documentation

## 2015-09-29 DIAGNOSIS — G8929 Other chronic pain: Secondary | ICD-10-CM

## 2015-09-29 DIAGNOSIS — M1990 Unspecified osteoarthritis, unspecified site: Secondary | ICD-10-CM | POA: Insufficient documentation

## 2015-09-29 DIAGNOSIS — R079 Chest pain, unspecified: Secondary | ICD-10-CM

## 2015-09-29 LAB — CBC WITH DIFFERENTIAL/PLATELET
BASOS PCT: 0 %
Basophils Absolute: 0 10*3/uL (ref 0.0–0.1)
EOS ABS: 0.2 10*3/uL (ref 0.0–0.7)
Eosinophils Relative: 2 %
HEMATOCRIT: 41.7 % (ref 39.0–52.0)
Hemoglobin: 14 g/dL (ref 13.0–17.0)
Lymphocytes Relative: 15 %
Lymphs Abs: 1.3 10*3/uL (ref 0.7–4.0)
MCH: 31.6 pg (ref 26.0–34.0)
MCHC: 33.6 g/dL (ref 30.0–36.0)
MCV: 94.1 fL (ref 78.0–100.0)
MONO ABS: 0.7 10*3/uL (ref 0.1–1.0)
MONOS PCT: 7 %
NEUTROS ABS: 6.6 10*3/uL (ref 1.7–7.7)
Neutrophils Relative %: 76 %
Platelets: 149 10*3/uL — ABNORMAL LOW (ref 150–400)
RBC: 4.43 MIL/uL (ref 4.22–5.81)
RDW: 13 % (ref 11.5–15.5)
WBC: 8.8 10*3/uL (ref 4.0–10.5)

## 2015-09-29 LAB — BASIC METABOLIC PANEL
Anion gap: 8 (ref 5–15)
BUN: 18 mg/dL (ref 6–20)
CALCIUM: 9.2 mg/dL (ref 8.9–10.3)
CO2: 23 mmol/L (ref 22–32)
CREATININE: 0.91 mg/dL (ref 0.61–1.24)
Chloride: 106 mmol/L (ref 101–111)
GFR calc Af Amer: >60 mL/min (ref >60)
GFR calc non Af Amer: >60 mL/min (ref >60)
GLUCOSE: 189 mg/dL — AB (ref 65–99)
Potassium: 4.5 mmol/L (ref 3.5–5.1)
Sodium: 137 mmol/L (ref 135–145)

## 2015-09-29 LAB — I-STAT TROPONIN, ED
TROPONIN I, POC: 0.01 ng/mL (ref 0.00–0.08)
Troponin i, poc: 0.02 ng/mL (ref 0.00–0.08)

## 2015-09-29 MED ORDER — ASPIRIN 81 MG PO CHEW
324.0000 mg | CHEWABLE_TABLET | Freq: Once | ORAL | Status: AC
  Administered 2015-09-29: 324 mg via ORAL
  Filled 2015-09-29: qty 4

## 2015-09-29 NOTE — ED Notes (Signed)
Pt alert & oriented x4, stable gait. Patient given discharge instructions, paperwork & prescription(s). Patient instructed to stop at the registration desk to finish any additional paperwork. Patient verbalized understanding. Pt left in wheelchair. pt left department w/ no further questions.

## 2015-09-29 NOTE — Discharge Instructions (Signed)
Take your usual prescriptions as previously directed.  Call your regular medical doctor and your Cardiologist on Monday to schedule a follow up appointment within the next 3 days.  Return to the Emergency Department immediately sooner if worsening.

## 2015-09-29 NOTE — ED Notes (Signed)
Lab at bedside for LA and ISTAT trop

## 2015-09-29 NOTE — ED Notes (Signed)
Patient c/o mid-sternal, non-radiating chest pain. Per patient started 30 minutes ago. Denies any shortness of breath, dizziness, nausea, vomiting, or back pain. Patient has hx of cardiac stents. Patient states "It feels like when I had my stents placed." Denies taking any nitro or aspirin today.

## 2015-09-29 NOTE — ED Provider Notes (Signed)
CSN: [id number]     Arrival date & time 09/29/15  1254 History   First MD Initiated Contact with Patient 09/29/15 1314     Chief Complaint  Patient presents with  . Chest Pain      HPI Pt was seen at 1320. Per pt, c/o gradual onset and persistence of constant right sided chest "pain" that began approximately noon PTA. Pt states he was "eating a donut" when the discomfort occurred. Chest discomfort improved "after I got here the way it usually does." Pt did not take his SL ntg for this discomfort, also has not taken his ASA today. Denies any other associated symptoms. Denies any other complaints. The symptoms have been associated with no other complaints. The patient has a significant history of similar symptoms previously, recently being evaluated for this complaint and multiple prior evals for same. Pt has had 14 ED evaluations, and 1 Cards MD office evaluation, in the past 3 months for this same complaint. Pt's last ED visit was 1 week ago.     Past Medical History  Diagnosis Date  . Diabetes mellitus, type II ([employer])   . Hyperlipidemia   . Coronary atherosclerosis of native coronary artery     a. CABG x 4 in 1989 (VG->OM1->OM2, VG->RCA, LIMA->LAD), b. 05/2010: DES to VG-OM1/OM2, DES to distal LCx. c. NSTEMI in 04/2011 - TO distal LCX stent and VG->OM2. d. 02/2012 NSTEMI DES to VG-OM1/continuation to OM2 occluded. e. inferior STEMI s/p DES to SVG-RAMUS 06/2012. f. inferolat STEMI 09/2012 s/p DES to SVG-interm; g. Lex MV (11/14):  EF 35%, inf-lat scar with small peri-infarct ischemia  . Essential hypertension, benign   . Osteoarthritis   . History of stroke   . History of pneumonia   . Cervical vertebral fracture ([place])   . Chronic back pain   . Benign prostatic hypertrophy     History of urinary retention  . Peptic ulcer disease   . Gastroesophageal reflux disease   . Ischemic cardiomyopathy Nov 2015    EF 35% cath, 45-50% by echo  . Hypothyroidism   . Anxiety about health      "multiple somatic complaints"  . Chronic chest pain    Past Surgical History  Procedure Laterality Date  . Tonsillectomy    . Coronary artery bypass graft  1989  . Coronary angioplasty  10/12, 8/13, 12/13, 3/14    SVG-OM PCI  . Cardiac catheterization  05/19/14    SVG-OM occl- medical Rx  . Left heart catheterization with coronary angiogram N/A 11/01/2011    Procedure: LEFT HEART CATHETERIZATION WITH CORONARY ANGIOGRAM;  Surgeon: Lorretta Harp, MD;  Location: [facility] CATH LAB;  Service: Cardiovascular;  Laterality: N/A;  . Percutaneous coronary stent intervention (pci-s) N/A 11/01/2011    Procedure: PERCUTANEOUS CORONARY STENT INTERVENTION (PCI-S);  Surgeon: Lorretta Harp, MD;  Location: [facility] CATH LAB;  Service: Cardiovascular;  Laterality: N/A;  . Left heart catheterization with coronary/graft angiogram N/A 03/10/2012    Procedure: LEFT HEART CATHETERIZATION WITH [name];  Surgeon: Sherren Mocha, MD;  Location: [facility] CATH LAB;  Service: Cardiovascular;  Laterality: N/A;  . Left heart catheterization with coronary angiogram N/A 06/18/2012    Procedure: LEFT HEART CATHETERIZATION WITH CORONARY ANGIOGRAM;  Surgeon: Peter M Martinique, MD;  Location: [facility] CATH LAB;  Service: Cardiovascular;  Laterality: N/A;  . Percutaneous coronary stent intervention (pci-s)  06/18/2012    Procedure: PERCUTANEOUS CORONARY STENT INTERVENTION (PCI-S);  Surgeon: Peter M Martinique, MD;  Location: [facility] CATH LAB;  Service:  Cardiovascular;;  . Left heart catheterization with coronary/graft angiogram  10/06/2012    Procedure: LEFT HEART CATHETERIZATION WITH [name];  Surgeon: Burnell Blanks, MD;  Location: [facility] CATH LAB;  Service: Cardiovascular;;  . Percutaneous coronary stent intervention (pci-s)  10/06/2012    Procedure: PERCUTANEOUS CORONARY STENT INTERVENTION (PCI-S);  Surgeon: Burnell Blanks, MD;  Location: [facility] CATH LAB;  Service: Cardiovascular;;  . Left heart catheterization with  coronary/graft angiogram N/A 11/09/2012    Procedure: LEFT HEART CATHETERIZATION WITH [name];  Surgeon: Peter M Martinique, MD;  Location: [facility] CATH LAB;  Service: Cardiovascular;  Laterality: N/A;  . Left heart catheterization with coronary/graft angiogram N/A 05/19/2014    Procedure: LEFT HEART CATHETERIZATION WITH [name];  Surgeon: Troy Sine, MD;  Location: [facility] CATH LAB;  Service: Cardiovascular;  Laterality: N/A;   Family History  Problem Relation Age of Onset  . Early death      Parents died young  . Appendicitis Mother     Pt was 81 year old  . Heart attack Father 91   Social History  Substance Use Topics  . Smoking status: Former Smoker -- 2.00 packs/day for 10 years    Types: Cigarettes    Start date: 07/14/1950    Quit date: 07/14/1961  . Smokeless tobacco: Current User    Types: Chew  . Alcohol Use: No    Review of Systems ROS: Statement: All systems negative except as marked or noted in the HPI; Constitutional: Negative for fever and chills. ; ; Eyes: Negative for eye pain, redness and discharge. ; ; ENMT: Negative for ear pain, hoarseness, nasal congestion, sinus pressure and sore throat. ; ; Cardiovascular: Negative for palpitations, diaphoresis, dyspnea and peripheral edema. ; ; Respiratory: Negative for cough, wheezing and stridor. ; ; Gastrointestinal: Negative for nausea, vomiting, diarrhea, abdominal pain, blood in stool, hematemesis, jaundice and rectal bleeding. . ; ; Genitourinary: Negative for dysuria, flank pain and hematuria. ; ; Musculoskeletal: +CP. Negative for back pain and neck pain. Negative for swelling and trauma.; ; Skin: Negative for pruritus, rash, abrasions, blisters, bruising and skin lesion.; ; Neuro: Negative for headache, lightheadedness and neck stiffness. Negative for weakness, altered level of consciousness , altered mental status, extremity weakness, paresthesias, involuntary movement, seizure and syncope.       Allergies  Review of patient's allergies indicates no known allergies.  Home Medications   Prior to Admission medications   Medication Sig Start Date End Date Taking? Authorizing Provider  acetaminophen (TYLENOL) 325 MG tablet Take 2 tablets (650 mg total) by mouth every 4 (four) hours as needed for headache or mild pain. Patient taking differently: Take 325 mg by mouth 2 (two) times daily as needed for headache or mild pain.  05/21/14   Erlene Quan, PA-C  albuterol (PROVENTIL HFA;VENTOLIN HFA) 108 (90 BASE) MCG/ACT inhaler Inhale 2 puffs into the lungs every 6 (six) hours as needed for wheezing or shortness of breath.    Historical Provider, MD  ALPRAZolam Duanne Moron) 0.25 MG tablet Take 0.25 mg by mouth 2 (two) times daily.    Historical Provider, MD  aspirin EC 81 MG tablet Take 81 mg by mouth daily.    Historical Provider, MD  atorvastatin (LIPITOR) 40 MG tablet Take 1 tablet (40 mg total) by mouth daily at 6 PM. 05/29/13   Liliane Shi, PA-C  bismuth subsalicylate (PEPTO BISMOL) 262 MG/15ML suspension Take 30 mLs by mouth every 6 (six) hours as needed for indigestion or diarrhea  or loose stools.     Historical Provider, MD  carvedilol (COREG) 3.125 MG tablet TAKE 1 TABLET BY MOUTH TWICE DAILY WITH MEALS. 07/26/15   Lendon Colonel, NP  famotidine (PEPCID) 20 MG tablet Take 1 tablet (20 mg total) by mouth at bedtime. 03/03/15   Rexene Alberts, MD  furosemide (LASIX) 20 MG tablet Take 20 mg by mouth as needed for fluid. Reported on 08/30/2015 05/06/13   Lendon Colonel, NP  isosorbide mononitrate (IMDUR) 30 MG 24 hr tablet Take 1.5 tablets (45 mg total) by mouth daily. 03/03/15   Rexene Alberts, MD  levothyroxine (SYNTHROID, LEVOTHROID) 50 MCG tablet Take 25 mcg by mouth daily before breakfast.  09/08/14   Historical Provider, MD  loperamide (IMODIUM) 2 MG capsule Take 1 capsule (2 mg total) by mouth 4 (four) times daily as needed for diarrhea or loose stools. 04/01/15   Kristen N Ward, DO   metFORMIN (GLUCOPHAGE) 500 MG tablet Take 2 tablets (1,000 mg total) by mouth 2 (two) times daily with a meal. RESTART ON Sunday, 03/04/15. Patient taking differently: Take 500 mg by mouth 2 (two) times daily with a meal.  03/03/15   Rexene Alberts, MD  Multiple Vitamin (MULTIVITAMIN WITH MINERALS) TABS Take 1 tablet by mouth daily.    Historical Provider, MD  NITROSTAT 0.4 MG SL tablet PLACE 1 TAB UNDER TONGUE EVERY 5 MIN IF NEEDED FOR CHEST PAIN. MAY USE 3 TIMES.NO RELIEF CALL 911. 07/04/15   Lendon Colonel, NP  ranolazine (RANEXA) 500 MG 12 hr tablet Take 1 tablet (500 mg total) by mouth 2 (two) times daily. 08/20/15   Orlie Dakin, MD  tamsulosin (FLOMAX) 0.4 MG CAPS Take 0.4 mg by mouth 2 (two) times daily.  01/27/13   Historical Provider, MD  ticagrelor (BRILINTA) 60 MG TABS tablet Take 1 tablet (60 mg total) by mouth 2 (two) times daily. 08/08/15   Herminio Commons, MD   BP 140/95 mmHg  Pulse 87  Temp(Src) 98.1 F (36.7 C) (Oral)  Resp 20  Ht 5\' 10"  (1.778 m)  Wt 170 lb (77.111 kg)  BMI 24.39 kg/m2  SpO2 99% Physical Exam 1325: Physical examination:  Nursing notes reviewed; Vital signs and O2 SAT reviewed;  Constitutional: Well developed, Well nourished, Well hydrated, In no acute distress; Head:  Normocephalic, atraumatic; Eyes: EOMI, PERRL, No scleral icterus; ENMT: Mouth and pharynx normal, Mucous membranes moist; Neck: Supple, Full range of motion, No lymphadenopathy; Cardiovascular: Regular rate and rhythm, No gallop; Respiratory: Breath sounds clear & equal bilaterally, No wheezes.  Speaking full sentences with ease, Normal respiratory effort/excursion; Chest: Nontender, Movement normal; Abdomen: Soft, Nontender, Nondistended, Normal bowel sounds; Genitourinary: No CVA tenderness; Extremities: Pulses normal, No tenderness, No edema, No calf edema or asymmetry.; Neuro: AA&Ox3, Major CN grossly intact.  Speech clear. No gross focal motor or sensory deficits in extremities.; Skin:  Color normal, Warm, Dry.; Psych:  Anxious.    ED Course  Procedures (including critical care time) Labs Review  Imaging Review  I have personally reviewed and evaluated these images and lab results as part of my medical decision-making.   EKG Interpretation   Date/Time:  Saturday September 29 2015 13:06:39 EDT Ventricular Rate:  85 PR Interval:  199 QRS Duration: 111 QT Interval:  392 QTC Calculation: 466 R Axis:   94 Text Interpretation:  Sinus rhythm Inferior infarct, age indeterminate  When compared with ECG of 09/19/2015 No significant change was found  Confirmed by [facility]  MD, Nunzio Cory [phone])  on 09/29/2015 1:17:58 PM      MDM  MDM Reviewed: previous chart, nursing note and vitals Reviewed previous: labs and ECG Interpretation: labs, ECG and x-ray   Results for orders placed or performed during the hospital encounter of [id number]  Basic metabolic panel  Result Value Ref Range   Sodium 137 135 - 145 mmol/L   Potassium 4.5 3.5 - 5.1 mmol/L   Chloride 106 101 - 111 mmol/L   CO2 23 22 - 32 mmol/L   Glucose, Bld 189 (H) 65 - 99 mg/dL   BUN 18 6 - 20 mg/dL   Creatinine, Ser 0.91 0.61 - 1.24 mg/dL   Calcium 9.2 8.9 - 10.3 mg/dL   GFR calc non Af Amer >60 >60 mL/min   GFR calc Af Amer >60 >60 mL/min   Anion gap 8 5 - 15  CBC with Differential  Result Value Ref Range   WBC 8.8 4.0 - 10.5 K/uL   RBC 4.43 4.22 - 5.81 MIL/uL   Hemoglobin 14.0 13.0 - 17.0 g/dL   HCT 41.7 39.0 - 52.0 %   MCV 94.1 78.0 - 100.0 fL   MCH 31.6 26.0 - 34.0 pg   MCHC 33.6 30.0 - 36.0 g/dL   RDW 13.0 11.5 - 15.5 %   Platelets 149 (L) 150 - 400 K/uL   Neutrophils Relative % 76 %   Neutro Abs 6.6 1.7 - 7.7 K/uL   Lymphocytes Relative 15 %   Lymphs Abs 1.3 0.7 - 4.0 K/uL   Monocytes Relative 7 %   Monocytes Absolute 0.7 0.1 - 1.0 K/uL   Eosinophils Relative 2 %   Eosinophils Absolute 0.2 0.0 - 0.7 K/uL   Basophils Relative 0 %   Basophils Absolute 0.0 0.0 - 0.1 K/uL  I-Stat Troponin, ED  (not at [facility])  Result Value Ref Range   Troponin i, poc 0.01 0.00 - 0.08 ng/mL   Comment 3          I-stat troponin, ED  Result Value Ref Range   Troponin i, poc 0.02 0.00 - 0.08 ng/mL   Comment 3           Dg Chest 2 View 09/29/2015  CLINICAL DATA:  Chest pain and shortness of breath. EXAM: CHEST  2 VIEW COMPARISON:  08/26/2015 chest radiograph. FINDINGS: Sternotomy wires appear aligned and intact. CABG clips overlie the mediastinum. Stable cardiomediastinal silhouette with normal heart size. No pneumothorax. No pleural effusion. Lungs appear clear, with no acute consolidative airspace disease and no pulmonary edema. IMPRESSION: No active cardiopulmonary disease. Electronically Signed   By: Ilona Sorrel M.D.   On: 09/29/2015 14:08     1325:  Pt has had multiple ED visits for this same complaint, all with reassuring ED workups. Pt lives alone and has significant anxiety regarding any "pains" that may occur in his upper abd or chest, and perseverates over "all my stents," "you know, all my stents," "they needed to take veins out my my leg for my bypass," etc, etc during obtaining HPI. Pt's CP generally resolves after arrival to the ED (which pt made a point to say to me today). Initial EKG is unchanged from previous, and initial troponin is negative. Doubt cardiac etiology at this time, but will check delta troponin at 2000.   2045:  Doubt PE as cause for symptoms with low risk [place].  Doubt ACS as cause for symptoms with normal troponin x2 and unchanged EKG from previous 8+ hours after symptoms. Pt strongly  encouraged to f/u with his PMD and Cards MD for good continuity of care and control of his chronic symptoms. Pt verb understanding. Pt has tol PO well while in the ED without N/V. States he continues to "feel better" and is ready to go home now. Dx and testing d/w pt.  Questions answered.  Verb understanding, agreeable to d/c home with outpt f/u.     Francine Graven, DO 10/03/15 1810

## 2015-10-02 ENCOUNTER — Emergency Department (HOSPITAL_COMMUNITY)
Admission: EM | Admit: 2015-10-02 | Discharge: 2015-10-02 | Disposition: A | Discharge status: Home / Self Care | Payer: Medicare Other | Attending: Emergency Medicine | Admitting: Emergency Medicine

## 2015-10-02 ENCOUNTER — Encounter (HOSPITAL_COMMUNITY): Payer: Self-pay

## 2015-10-02 DIAGNOSIS — E119 Type 2 diabetes mellitus without complications: Secondary | ICD-10-CM | POA: Diagnosis not present

## 2015-10-02 DIAGNOSIS — R61 Generalized hyperhidrosis: Secondary | ICD-10-CM | POA: Insufficient documentation

## 2015-10-02 DIAGNOSIS — I2510 Atherosclerotic heart disease of native coronary artery without angina pectoris: Secondary | ICD-10-CM | POA: Diagnosis not present

## 2015-10-02 DIAGNOSIS — Z87891 Personal history of nicotine dependence: Secondary | ICD-10-CM | POA: Insufficient documentation

## 2015-10-02 DIAGNOSIS — E039 Hypothyroidism, unspecified: Secondary | ICD-10-CM | POA: Diagnosis not present

## 2015-10-02 DIAGNOSIS — K130 Diseases of lips: Secondary | ICD-10-CM | POA: Insufficient documentation

## 2015-10-02 DIAGNOSIS — Z7982 Long term (current) use of aspirin: Secondary | ICD-10-CM | POA: Insufficient documentation

## 2015-10-02 DIAGNOSIS — Z7984 Long term (current) use of oral hypoglycemic drugs: Secondary | ICD-10-CM | POA: Diagnosis not present

## 2015-10-02 DIAGNOSIS — M1990 Unspecified osteoarthritis, unspecified site: Secondary | ICD-10-CM | POA: Insufficient documentation

## 2015-10-02 DIAGNOSIS — Z951 Presence of aortocoronary bypass graft: Secondary | ICD-10-CM | POA: Diagnosis not present

## 2015-10-02 DIAGNOSIS — E785 Hyperlipidemia, unspecified: Secondary | ICD-10-CM | POA: Diagnosis not present

## 2015-10-02 DIAGNOSIS — Z8673 Personal history of transient ischemic attack (TIA), and cerebral infarction without residual deficits: Secondary | ICD-10-CM | POA: Diagnosis not present

## 2015-10-02 LAB — CBC WITH DIFFERENTIAL/PLATELET
BASOS ABS: 0 10*3/uL (ref 0.0–0.1)
BASOS PCT: 0 %
EOS PCT: 2 %
Eosinophils Absolute: 0.1 10*3/uL (ref 0.0–0.7)
HCT: 38.9 % — ABNORMAL LOW (ref 39.0–52.0)
Hemoglobin: 13.1 g/dL (ref 13.0–17.0)
Lymphocytes Relative: 12 %
Lymphs Abs: 0.8 10*3/uL (ref 0.7–4.0)
MCH: 31.5 pg (ref 26.0–34.0)
MCHC: 33.7 g/dL (ref 30.0–36.0)
MCV: 93.5 fL (ref 78.0–100.0)
MONO ABS: 0.5 10*3/uL (ref 0.1–1.0)
Monocytes Relative: 8 %
NEUTROS ABS: 5.7 10*3/uL (ref 1.7–7.7)
Neutrophils Relative %: 78 %
PLATELETS: 141 10*3/uL — AB (ref 150–400)
RBC: 4.16 MIL/uL — ABNORMAL LOW (ref 4.22–5.81)
RDW: 13.2 % (ref 11.5–15.5)
WBC: 7.2 10*3/uL (ref 4.0–10.5)

## 2015-10-02 LAB — BASIC METABOLIC PANEL
Anion gap: 10 (ref 5–15)
BUN: 13 mg/dL (ref 6–20)
CO2: 23 mmol/L (ref 22–32)
Calcium: 9.1 mg/dL (ref 8.9–10.3)
Chloride: 104 mmol/L (ref 101–111)
Creatinine, Ser: 0.82 mg/dL (ref 0.61–1.24)
GFR calc non Af Amer: >60 mL/min (ref >60)
Glucose, Bld: 172 mg/dL — ABNORMAL HIGH (ref 65–99)
Potassium: 4.1 mmol/L (ref 3.5–5.1)
SODIUM: 137 mmol/L (ref 135–145)

## 2015-10-02 LAB — TROPONIN I: Troponin I: <0.03 ng/mL (ref <0.031)

## 2015-10-02 NOTE — ED Notes (Signed)
Pt reports had been out running errands and reports when he got home he felt sweaty.  Denies any other symptoms.  Pt says now feels like he has cooled off but feels nervous.

## 2015-10-02 NOTE — Discharge Instructions (Signed)
Tests were good.   Follow-up your primary care doctor. °

## 2015-10-02 NOTE — ED Provider Notes (Signed)
CSN: [id number]     Arrival date & time 10/02/15  1205 History   First MD Initiated Contact with Patient 10/02/15 1405     Chief Complaint  Patient presents with  . diaphoresis      (Consider location/radiation/quality/duration/timing/severity/associated sxs/prior Treatment) HPI.... Complains of sweatiness after running errands today. No substernal chest pain, dyspnea, nausea. It is hot and humid in the community. He is taking no medications for this complaint. He now feels totally back to normal. He has primary care follow-up.  Past Medical History  Diagnosis Date  . Diabetes mellitus, type II ([place])   . Hyperlipidemia   . Coronary atherosclerosis of native coronary artery     a. CABG x 4 in 1989 (VG->OM1->OM2, VG->RCA, LIMA->LAD), b. 05/2010: DES to VG-OM1/OM2, DES to distal LCx. c. NSTEMI in 04/2011 - TO distal LCX stent and VG->OM2. d. 02/2012 NSTEMI DES to VG-OM1/continuation to OM2 occluded. e. inferior STEMI s/p DES to SVG-RAMUS 06/2012. f. inferolat STEMI 09/2012 s/p DES to SVG-interm; g. Lex MV (11/14):  EF 35%, inf-lat scar with small peri-infarct ischemia  . Essential hypertension, benign   . Osteoarthritis   . History of stroke   . History of pneumonia   . Cervical vertebral fracture ([place])   . Chronic back pain   . Benign prostatic hypertrophy     History of urinary retention  . Peptic ulcer disease   . Gastroesophageal reflux disease   . Ischemic cardiomyopathy Nov 2015    EF 35% cath, 45-50% by echo  . Hypothyroidism   . Anxiety about health     "multiple somatic complaints"  . Chronic chest pain    Past Surgical History  Procedure Laterality Date  . Tonsillectomy    . Coronary artery bypass graft  1989  . Coronary angioplasty  10/12, 8/13, 12/13, 3/14    SVG-OM PCI  . Cardiac catheterization  05/19/14    SVG-OM occl- medical Rx  . Left heart catheterization with coronary angiogram N/A 11/01/2011    Procedure: LEFT HEART CATHETERIZATION WITH CORONARY ANGIOGRAM;   Surgeon: Lorretta Harp, MD;  Location: [facility] CATH LAB;  Service: Cardiovascular;  Laterality: N/A;  . Percutaneous coronary stent intervention (pci-s) N/A 11/01/2011    Procedure: PERCUTANEOUS CORONARY STENT INTERVENTION (PCI-S);  Surgeon: Lorretta Harp, MD;  Location: [facility] CATH LAB;  Service: Cardiovascular;  Laterality: N/A;  . Left heart catheterization with coronary/graft angiogram N/A 03/10/2012    Procedure: LEFT HEART CATHETERIZATION WITH [name];  Surgeon: Sherren Mocha, MD;  Location: [facility] CATH LAB;  Service: Cardiovascular;  Laterality: N/A;  . Left heart catheterization with coronary angiogram N/A 06/18/2012    Procedure: LEFT HEART CATHETERIZATION WITH CORONARY ANGIOGRAM;  Surgeon: Peter M Martinique, MD;  Location: [facility] CATH LAB;  Service: Cardiovascular;  Laterality: N/A;  . Percutaneous coronary stent intervention (pci-s)  06/18/2012    Procedure: PERCUTANEOUS CORONARY STENT INTERVENTION (PCI-S);  Surgeon: Peter M Martinique, MD;  Location: [facility] CATH LAB;  Service: Cardiovascular;;  . Left heart catheterization with coronary/graft angiogram  10/06/2012    Procedure: LEFT HEART CATHETERIZATION WITH [name];  Surgeon: Burnell Blanks, MD;  Location: [facility] CATH LAB;  Service: Cardiovascular;;  . Percutaneous coronary stent intervention (pci-s)  10/06/2012    Procedure: PERCUTANEOUS CORONARY STENT INTERVENTION (PCI-S);  Surgeon: Burnell Blanks, MD;  Location: [facility] CATH LAB;  Service: Cardiovascular;;  . Left heart catheterization with coronary/graft angiogram N/A 11/09/2012    Procedure: LEFT HEART CATHETERIZATION WITH [name];  Surgeon: Ander Slade  Martinique, MD;  Location: [facility] CATH LAB;  Service: Cardiovascular;  Laterality: N/A;  . Left heart catheterization with coronary/graft angiogram N/A 05/19/2014    Procedure: LEFT HEART CATHETERIZATION WITH [name];  Surgeon: Troy Sine, MD;  Location: [facility] CATH LAB;  Service: Cardiovascular;   Laterality: N/A;   Family History  Problem Relation Age of Onset  . Early death      Parents died young  . Appendicitis Mother     Pt was 8 year old  . Heart attack Father 85   Social History  Substance Use Topics  . Smoking status: Former Smoker -- 2.00 packs/day for 10 years    Types: Cigarettes    Start date: 07/14/1950    Quit date: 07/14/1961  . Smokeless tobacco: Current User    Types: Chew  . Alcohol Use: No    Review of Systems  All other systems reviewed and are negative.     Allergies  Review of patient's allergies indicates no known allergies.  Home Medications   Prior to Admission medications   Medication Sig Start Date End Date Taking? Authorizing Provider  acetaminophen (TYLENOL) 325 MG tablet Take 2 tablets (650 mg total) by mouth every 4 (four) hours as needed for headache or mild pain. Patient taking differently: Take 325 mg by mouth 2 (two) times daily as needed for headache or mild pain.  05/21/14   Erlene Quan, PA-C  albuterol (PROVENTIL HFA;VENTOLIN HFA) 108 (90 BASE) MCG/ACT inhaler Inhale 2 puffs into the lungs every 6 (six) hours as needed for wheezing or shortness of breath.    Historical Provider, MD  ALPRAZolam Duanne Moron) 0.25 MG tablet Take 0.25 mg by mouth 2 (two) times daily.    Historical Provider, MD  aspirin EC 81 MG tablet Take 81 mg by mouth daily.    Historical Provider, MD  atorvastatin (LIPITOR) 40 MG tablet Take 1 tablet (40 mg total) by mouth daily at 6 PM. 05/29/13   Liliane Shi, PA-C  bismuth subsalicylate (PEPTO BISMOL) 262 MG/15ML suspension Take 30 mLs by mouth every 6 (six) hours as needed for indigestion or diarrhea or loose stools.     Historical Provider, MD  carvedilol (COREG) 3.125 MG tablet TAKE 1 TABLET BY MOUTH TWICE DAILY WITH MEALS. 07/26/15   Lendon Colonel, NP  famotidine (PEPCID) 20 MG tablet Take 1 tablet (20 mg total) by mouth at bedtime. 03/03/15   Rexene Alberts, MD  furosemide (LASIX) 20 MG tablet Take 20 mg  by mouth as needed for fluid. Reported on 08/30/2015 05/06/13   Lendon Colonel, NP  isosorbide mononitrate (IMDUR) 30 MG 24 hr tablet Take 1.5 tablets (45 mg total) by mouth daily. 03/03/15   Rexene Alberts, MD  levothyroxine (SYNTHROID, LEVOTHROID) 50 MCG tablet Take 50 mcg by mouth daily before breakfast.  09/08/14   Historical Provider, MD  loperamide (IMODIUM) 2 MG capsule Take 1 capsule (2 mg total) by mouth 4 (four) times daily as needed for diarrhea or loose stools. 04/01/15   Kristen N Ward, DO  metFORMIN (GLUCOPHAGE) 500 MG tablet Take 2 tablets (1,000 mg total) by mouth 2 (two) times daily with a meal. RESTART ON Sunday, 03/04/15. Patient taking differently: Take 500 mg by mouth 2 (two) times daily with a meal.  03/03/15   Rexene Alberts, MD  Multiple Vitamin (MULTIVITAMIN WITH MINERALS) TABS Take 1 tablet by mouth daily.    Historical Provider, MD  NITROSTAT 0.4 MG SL tablet PLACE 1 TAB UNDER  TONGUE EVERY 5 MIN IF NEEDED FOR CHEST PAIN. MAY USE 3 TIMES.NO RELIEF CALL 911. 07/04/15   Lendon Colonel, NP  ranolazine (RANEXA) 500 MG 12 hr tablet Take 1 tablet (500 mg total) by mouth 2 (two) times daily. 08/20/15   Orlie Dakin, MD  tamsulosin (FLOMAX) 0.4 MG CAPS Take 0.4 mg by mouth 2 (two) times daily.  01/27/13   Historical Provider, MD  ticagrelor (BRILINTA) 60 MG TABS tablet Take 1 tablet (60 mg total) by mouth 2 (two) times daily. 08/08/15   Herminio Commons, MD   BP 137/68 mmHg  Pulse 88  Temp(Src) 98.8 F (37.1 C) (Temporal)  Resp 16  Ht 5\' 10"  (1.778 m)  Wt 170 lb (77.111 kg)  BMI 24.39 kg/m2  SpO2 99% Physical Exam  Constitutional: He is oriented to person, place, and time. He appears well-developed and well-nourished.  HENT:  Head: Normocephalic and atraumatic.  Eyes: Conjunctivae and EOM are normal. Pupils are equal, round, and reactive to light.  Neck: Normal range of motion. Neck supple.  Cardiovascular: Normal rate and regular rhythm.   Pulmonary/Chest: Effort  normal and breath sounds normal.  Abdominal: Soft. Bowel sounds are normal.  Musculoskeletal: Normal range of motion.  Neurological: He is alert and oriented to person, place, and time.  Skin: Skin is warm and dry.  1 x 1 cm lesion on left lower lip  Psychiatric: He has a normal mood and affect. His behavior is normal.  Nursing note and vitals reviewed.   ED Course  Procedures (including critical care time) Labs Review Labs Reviewed  CBC WITH DIFFERENTIAL/PLATELET - Abnormal; Notable for the following:    RBC 4.16 (*)    HCT 38.9 (*)    Platelets 141 (*)    All other components within normal limits  BASIC METABOLIC PANEL - Abnormal; Notable for the following:    Glucose, Bld 172 (*)    All other components within normal limits  TROPONIN I    Imaging Review No results found. I have personally reviewed and evaluated these images and lab results as part of my medical decision-making.   EKG Interpretation   Date/Time:  Tuesday October 02 2015 12:18:43 EDT Ventricular Rate:  89 PR Interval:  184 QRS Duration: 104 QT Interval:  390 QTC Calculation: 474 R Axis:   82 Text Interpretation:  Sinus rhythm with occasional Premature ventricular  complexes Nonspecific ST and T wave abnormality Abnormal ECG Confirmed by  Lacinda Axon  MD, Berna Gitto ([id number]) on 10/02/2015 3:24:48 PM      MDM   Final diagnoses:  Diaphoresis    Patient is frequent visitor to the emergency department. He appears to be his normal self.  Screening tests including EKG, troponin show no acute findings. I encouraged him to get follow-up for his left lower lip lesion.    [name], MD 10/02/15 1540

## 2015-10-02 NOTE — ED Notes (Signed)
Pt made aware to return if symptoms worsen or if any life threatening symptoms occur.   

## 2015-10-10 ENCOUNTER — Ambulatory Visit (INDEPENDENT_AMBULATORY_CARE_PROVIDER_SITE_OTHER): Payer: Medicare Other | Admitting: Nurse Practitioner

## 2015-10-10 ENCOUNTER — Encounter: Payer: Self-pay | Admitting: Nurse Practitioner

## 2015-10-10 VITALS — BP 123/67 | HR 84 | Temp 98.5°F | Ht 70.0 in | Wt 171.0 lb

## 2015-10-10 DIAGNOSIS — K219 Gastro-esophageal reflux disease without esophagitis: Secondary | ICD-10-CM

## 2015-10-10 DIAGNOSIS — R1013 Epigastric pain: Secondary | ICD-10-CM

## 2015-10-10 DIAGNOSIS — I209 Angina pectoris, unspecified: Secondary | ICD-10-CM

## 2015-10-10 NOTE — Assessment & Plan Note (Signed)
Patient with multiple emergency room visits over the past year for epigastric pain and chest pain. He is currently being seen actively by integrative health including social work and paramedic who visit in 3 times a week for medication compliance and education. They state he is likely very noncompliant with his medications. Their working to address this. Has a history of GERD, no dysphagia symptoms currently. Is described per her tonics daily but likely not taking it. Is taking Pepto-Bismol 6-8 times a day. Discussed the need to take his medications regularly. Integrative health we'll attempt to improve his compliance with his PPI which will likely reduce the need for Pepto-Bismol as frequently. Return for follow-up in 2 months and if he is compliant and still having problems can consider further evaluation such as EGD.

## 2015-10-10 NOTE — Patient Instructions (Signed)
1. It is very important that you take all your medicines as the paramedic shows you to. 2. Remember, although they are not in the bottles you are used to, all your medicines are in those bubble packs. Each bubble is like an individual, one time use a bottle.  3. When it is time to take medicines, pop the bubble for that day and time and take all the pills in the pack. 4. It is important that you take her medicines as directed so we can help your indigestion and heartburn problems. 5. We will have you come back in 2 months so we can see how you're doing when she were taking your heartburn medicine as she should.

## 2015-10-10 NOTE — Progress Notes (Signed)
CC'D TO PCP °

## 2015-10-10 NOTE — Assessment & Plan Note (Signed)
Patient with recurrent epigastric pain which she associates with chest pain and given his cardiac history results infrequent presentation to the emergency room. Likely noncompliant with PPI as noted above. Is seen in integrative health actively to improve his compliance and reduced ER visits. Plan as per above, return for follow-up in 2 months.

## 2015-10-10 NOTE — Progress Notes (Signed)
Primary Care Physician:  Rogers Blocker, MD Primary Gastroenterologist:  Dr. Oneida Alar  Chief Complaint  Patient presents with  . Abdominal Pain    been to the ED several times  . Constipation  . Diarrhea    HPI:   Jeffrey [name] is a 82 y.o. male who presents On referral from primary care for evaluation of epigastric/chest pain. PCP notes reviewed, last saw PCP on 09/18/2015. I point noted occasional heartburn and indigestion despite PPI, previously multiple visits emergency room with chest pain and a negative cardiac evaluation although he does have a history of multiple stents. Daughter states she uses Pepto-Bismol frequently which helps his GI symptoms and occasionally has chest pain as well. There is concern for exacerbating GERD or other GI cause of his pain. He has been to the emergency room 47 times in the past year. Labs included with his primary care note include CMP which is normal except for high fasting glucose, hemoglobin A1c high at 8.2.  Last EGD done 09/15/2009 for foreign body removal. Noted foreign body consistent with food as well as a moderate amount of liquid, foreign body removed and noted residual erythema, peptic ulcer disease. Recommended return in next 1-2 weeks for dilation which does not appear to have happened.  Today he is accompanied by EMS and Education officer, museum with integrated health. They are trying to reconcile his medications. Today he states he has daily pain epigastric/chest "feels like indigestion." When he has symptoms also with esophageal burning, occasional bitter taste. Denies dysphagia symptoms. Takes Pepto Bismol 6-8 times a day. Also prescribed Protonix daily, EMS states non-compliance with multiple medications. Denies other abdominal pain, N/V. Denies hematochezia. Has some dark stools but is on frequent Pepto Bismol and iron supplements. Has history of constipation, has a bowel movement once every 2-3 days, occasionally has to use a suppository. Denies  chest pain, dyspnea, dizziness, lightheadedness, syncope, near syncope. Denies any other upper or lower GI symptoms.  Past Medical History  Diagnosis Date  . Diabetes mellitus, type II ([place])   . Hyperlipidemia   . Coronary atherosclerosis of native coronary artery     a. CABG x 4 in 1989 (VG->OM1->OM2, VG->RCA, LIMA->LAD), b. 05/2010: DES to VG-OM1/OM2, DES to distal LCx. c. NSTEMI in 04/2011 - TO distal LCX stent and VG->OM2. d. 02/2012 NSTEMI DES to VG-OM1/continuation to OM2 occluded. e. inferior STEMI s/p DES to SVG-RAMUS 06/2012. f. inferolat STEMI 09/2012 s/p DES to SVG-interm; g. Lex MV (11/14):  EF 35%, inf-lat scar with small peri-infarct ischemia  . Essential hypertension, benign   . Osteoarthritis   . History of stroke   . History of pneumonia   . Cervical vertebral fracture ([place])   . Chronic back pain   . Benign prostatic hypertrophy     History of urinary retention  . Peptic ulcer disease   . Gastroesophageal reflux disease   . Ischemic cardiomyopathy Nov 2015    EF 35% cath, 45-50% by echo  . Hypothyroidism   . Anxiety about health     "multiple somatic complaints"  . Chronic chest pain     Past Surgical History  Procedure Laterality Date  . Tonsillectomy    . Coronary artery bypass graft  1989  . Coronary angioplasty  10/12, 8/13, 12/13, 3/14    SVG-OM PCI  . Cardiac catheterization  05/19/14    SVG-OM occl- medical Rx  . Left heart catheterization with coronary angiogram N/A 11/01/2011    Procedure: LEFT HEART CATHETERIZATION  WITH CORONARY ANGIOGRAM;  Surgeon: Lorretta Harp, MD;  Location[facility] CATH LAB;  Service: Cardiovascular;  Laterality: N/A;  . Percutaneous coronary stent intervention (pci-s) N/A 11/01/2011    Procedure: PERCUTANEOUS CORONARY STENT INTERVENTION (PCI-S);  Surgeon: Lorretta Harp, MD;  Location: [facility] CATH LAB;  Service: Cardiovascular;  Laterality: N/A;  . Left heart catheterization with coronary/graft angiogram N/A 03/10/2012    Procedure: LEFT  HEART CATHETERIZATION WITH [name];  Surgeon: Sherren Mocha, MD;  Location: [facility] CATH LAB;  Service: Cardiovascular;  Laterality: N/A;  . Left heart catheterization with coronary angiogram N/A 06/18/2012    Procedure: LEFT HEART CATHETERIZATION WITH CORONARY ANGIOGRAM;  Surgeon: Peter M Martinique, MD;  Location: [facility] CATH LAB;  Service: Cardiovascular;  Laterality: N/A;  . Percutaneous coronary stent intervention (pci-s)  06/18/2012    Procedure: PERCUTANEOUS CORONARY STENT INTERVENTION (PCI-S);  Surgeon: Peter M Martinique, MD;  Location: [facility] CATH LAB;  Service: Cardiovascular;;  . Left heart catheterization with coronary/graft angiogram  10/06/2012    Procedure: LEFT HEART CATHETERIZATION WITH [name];  Surgeon: Burnell Blanks, MD;  Location: [facility] CATH LAB;  Service: Cardiovascular;;  . Percutaneous coronary stent intervention (pci-s)  10/06/2012    Procedure: PERCUTANEOUS CORONARY STENT INTERVENTION (PCI-S);  Surgeon: Burnell Blanks, MD;  Location: [facility] CATH LAB;  Service: Cardiovascular;;  . Left heart catheterization with coronary/graft angiogram N/A 11/09/2012    Procedure: LEFT HEART CATHETERIZATION WITH [name];  Surgeon: Peter M Martinique, MD;  Location: [facility] CATH LAB;  Service: Cardiovascular;  Laterality: N/A;  . Left heart catheterization with coronary/graft angiogram N/A 05/19/2014    Procedure: LEFT HEART CATHETERIZATION WITH [name];  Surgeon: Troy Sine, MD;  Location: [facility] CATH LAB;  Service: Cardiovascular;  Laterality: N/A;  . Egd with foreign body removal  08/13/2009    Food impaction    Current Outpatient Prescriptions  Medication Sig Dispense Refill  . albuterol (PROVENTIL HFA;VENTOLIN HFA) 108 (90 BASE) MCG/ACT inhaler Inhale 2 puffs into the lungs every 6 (six) hours as needed for wheezing or shortness of breath.    . ALPRAZolam (XANAX) 0.25 MG tablet Take 0.25 mg by mouth 2 (two) times daily.    Marland Kitchen aspirin EC 81  MG tablet Take 81 mg by mouth daily.    Marland Kitchen bismuth subsalicylate (PEPTO BISMOL) 262 MG/15ML suspension Take 30 mLs by mouth every 6 (six) hours as needed for indigestion or diarrhea or loose stools.     . carvedilol (COREG) 3.125 MG tablet TAKE 1 TABLET BY MOUTH TWICE DAILY WITH MEALS. 60 tablet 6  . famotidine (PEPCID) 20 MG tablet Take 1 tablet (20 mg total) by mouth at bedtime. 30 tablet 3  . isosorbide mononitrate (IMDUR) 30 MG 24 hr tablet Take 1.5 tablets (45 mg total) by mouth daily. 45 tablet 11  . metFORMIN (GLUCOPHAGE) 500 MG tablet Take 2 tablets (1,000 mg total) by mouth 2 (two) times daily with a meal. RESTART ON Sunday, 03/04/15. (Patient taking differently: Take 500 mg by mouth 2 (two) times daily with a meal. )    . pantoprazole (PROTONIX) 40 MG tablet Take 40 mg by mouth daily.    . ranolazine (RANEXA) 500 MG 12 hr tablet Take 1 tablet (500 mg total) by mouth 2 (two) times daily. 60 tablet 0  . tamsulosin (FLOMAX) 0.4 MG CAPS Take 0.4 mg by mouth 2 (two) times daily.     . ticagrelor (BRILINTA) 60 MG TABS tablet Take 1 tablet (60 mg total) by mouth  2 (two) times daily. 60 tablet 3  . acetaminophen (TYLENOL) 325 MG tablet Take 2 tablets (650 mg total) by mouth every 4 (four) hours as needed for headache or mild pain. (Patient not taking: Reported on 10/10/2015)    . atorvastatin (LIPITOR) 40 MG tablet Take 1 tablet (40 mg total) by mouth daily at 6 PM. (Patient not taking: Reported on 10/10/2015) 30 tablet 11  . furosemide (LASIX) 20 MG tablet Take 20 mg by mouth as needed for fluid. Reported on 10/10/2015    . levothyroxine (SYNTHROID, LEVOTHROID) 50 MCG tablet Take 50 mcg by mouth daily before breakfast. Reported on 10/10/2015    . loperamide (IMODIUM) 2 MG capsule Take 1 capsule (2 mg total) by mouth 4 (four) times daily as needed for diarrhea or loose stools. (Patient not taking: Reported on 10/10/2015) 12 capsule 0  . Multiple Vitamin (MULTIVITAMIN WITH MINERALS) TABS Take 1 tablet by  mouth daily. Reported on 10/10/2015    . NITROSTAT 0.4 MG SL tablet PLACE 1 TAB UNDER TONGUE EVERY 5 MIN IF NEEDED FOR CHEST PAIN. MAY USE 3 TIMES.NO RELIEF CALL 911. (Patient not taking: Reported on 10/10/2015) 25 tablet 3   No current facility-administered medications for this visit.    Allergies as of 10/10/2015  . (No Known Allergies)    Family History  Problem Relation Age of Onset  . Early death      Parents died young  . Appendicitis Mother     Pt was 40 year old  . Heart attack Father 58  . Colon cancer Neg Hx     Social History   Social History  . Marital Status: Divorced    Spouse Name: N/A  . Number of Children: N/A  . Years of Education: N/A   Occupational History  . Retired     Designer, television/film set   Social History Main Topics  . Smoking status: Former Smoker -- 2.00 packs/day for 10 years    Types: Cigarettes    Start date: 07/14/1950    Quit date: 07/14/1961  . Smokeless tobacco: Current User    Types: Chew  . Alcohol Use: No  . Drug Use: No  . Sexual Activity: No   Other Topics Concern  . Not on file   Social History Narrative   ** Merged History Encounter **...   He was raised by his uncle.  His mother deceased when   he was 64 year old with appendicitis. Father deceased from an MI at age   55.  He has 1 sister, but he does not know her health status as they   have been separated.           Review of Systems: 10-point ROS negative except as per HPI.    Physical Exam: BP 123/67 mmHg  Pulse 84  Temp(Src) 98.5 F (36.9 C) (Oral)  Ht 5\' 10"  (1.778 m)  Wt 171 lb (77.565 kg)  BMI 24.54 kg/m2 General:   Alert and oriented. Pleasant and cooperative. Well-nourished and well-developed. Difficult to keep focused on questioning. Head:  Normocephalic and atraumatic. Eyes:  Without icterus, sclera clear and conjunctiva pink.  Ears:  Normal auditory acuity. Mouth:  Left lower-lip lesions noted, dark, approximately 5 o'clock, appears dark; per patient is a  skin cancer he is scheduled to have evaluated. Cardiovascular:  S1, S2 present without murmurs appreciated. Extremities without clubbing or edema. Respiratory:  Diminished but clear to auscultation bilaterally. No wheezes, rales, or rhonchi. No distress.  Gastrointestinal:  +BS,  soft, non-tender and non-distended. No HSM noted. No guarding or rebound. No masses appreciated.  Rectal:  Deferred  Musculoskalatal:  Symmetrical without gross deformities. Skin:  Intact without significant lesions or rashes. Neurologic:  Alert and oriented x4;  grossly normal neurologically. Psych:  Alert and cooperative. Normal mood and affect. Heme/Lymph/Immune: No excessive bruising noted.    10/10/2015 11:43 AM   Disclaimer: This note was dictated with voice recognition software. Similar sounding words can inadvertently be transcribed and may not be corrected upon review.

## 2015-10-17 ENCOUNTER — Ambulatory Visit (INDEPENDENT_AMBULATORY_CARE_PROVIDER_SITE_OTHER): Payer: Medicare Other | Admitting: "Endocrinology

## 2015-10-17 ENCOUNTER — Encounter: Payer: Self-pay | Admitting: "Endocrinology

## 2015-10-17 VITALS — BP 144/79 | HR 82 | Ht 70.0 in | Wt 171.0 lb

## 2015-10-17 DIAGNOSIS — I209 Angina pectoris, unspecified: Secondary | ICD-10-CM | POA: Diagnosis not present

## 2015-10-17 DIAGNOSIS — E039 Hypothyroidism, unspecified: Secondary | ICD-10-CM

## 2015-10-17 DIAGNOSIS — E785 Hyperlipidemia, unspecified: Secondary | ICD-10-CM

## 2015-10-17 DIAGNOSIS — E1159 Type 2 diabetes mellitus with other circulatory complications: Secondary | ICD-10-CM

## 2015-10-17 DIAGNOSIS — I10 Essential (primary) hypertension: Secondary | ICD-10-CM | POA: Diagnosis not present

## 2015-10-17 MED ORDER — EMPAGLIFLOZIN 10 MG PO TABS: 10.0000 mg | ORAL_TABLET | Freq: Every day | ORAL | Status: DC

## 2015-10-17 NOTE — Patient Instructions (Signed)

## 2015-10-17 NOTE — Progress Notes (Signed)
Subjective:    Patient ID: Jeffrey [name], male    DOB: Oct 05, 1928. Patient is being seen in consultation for management of diabetes requested by  Rogers Blocker, MD  Past Medical History  Diagnosis Date  . Diabetes mellitus, type II ([place])   . Hyperlipidemia   . Coronary atherosclerosis of native coronary artery     a. CABG x 4 in 1989 (VG->OM1->OM2, VG->RCA, LIMA->LAD), b. 05/2010: DES to VG-OM1/OM2, DES to distal LCx. c. NSTEMI in 04/2011 - TO distal LCX stent and VG->OM2. d. 02/2012 NSTEMI DES to VG-OM1/continuation to OM2 occluded. e. inferior STEMI s/p DES to SVG-RAMUS 06/2012. f. inferolat STEMI 09/2012 s/p DES to SVG-interm; g. Lex MV (11/14):  EF 35%, inf-lat scar with small peri-infarct ischemia  . Essential hypertension, benign   . Osteoarthritis   . History of stroke   . History of pneumonia   . Cervical vertebral fracture ([place])   . Chronic back pain   . Benign prostatic hypertrophy     History of urinary retention  . Peptic ulcer disease   . Gastroesophageal reflux disease   . Ischemic cardiomyopathy Nov 2015    EF 35% cath, 45-50% by echo  . Hypothyroidism   . Anxiety about health     "multiple somatic complaints"  . Chronic chest pain    Past Surgical History  Procedure Laterality Date  . Tonsillectomy    . Coronary artery bypass graft  1989  . Coronary angioplasty  10/12, 8/13, 12/13, 3/14    SVG-OM PCI  . Cardiac catheterization  05/19/14    SVG-OM occl- medical Rx  . Left heart catheterization with coronary angiogram N/A 11/01/2011    Procedure: LEFT HEART CATHETERIZATION WITH CORONARY ANGIOGRAM;  Surgeon: Lorretta Harp, MD;  Location: [facility] CATH LAB;  Service: Cardiovascular;  Laterality: N/A;  . Percutaneous coronary stent intervention (pci-s) N/A 11/01/2011    Procedure: PERCUTANEOUS CORONARY STENT INTERVENTION (PCI-S);  Surgeon: Lorretta Harp, MD;  Location: [facility] CATH LAB;  Service: Cardiovascular;  Laterality: N/A;  . Left heart catheterization with  coronary/graft angiogram N/A 03/10/2012    Procedure: LEFT HEART CATHETERIZATION WITH [name];  Surgeon: Sherren Mocha, MD;  Location: [facility] CATH LAB;  Service: Cardiovascular;  Laterality: N/A;  . Left heart catheterization with coronary angiogram N/A 06/18/2012    Procedure: LEFT HEART CATHETERIZATION WITH CORONARY ANGIOGRAM;  Surgeon: Peter M Martinique, MD;  Location: [facility] CATH LAB;  Service: Cardiovascular;  Laterality: N/A;  . Percutaneous coronary stent intervention (pci-s)  06/18/2012    Procedure: PERCUTANEOUS CORONARY STENT INTERVENTION (PCI-S);  Surgeon: Peter M Martinique, MD;  Location: [facility] CATH LAB;  Service: Cardiovascular;;  . Left heart catheterization with coronary/graft angiogram  10/06/2012    Procedure: LEFT HEART CATHETERIZATION WITH [name];  Surgeon: Burnell Blanks, MD;  Location: [facility] CATH LAB;  Service: Cardiovascular;;  . Percutaneous coronary stent intervention (pci-s)  10/06/2012    Procedure: PERCUTANEOUS CORONARY STENT INTERVENTION (PCI-S);  Surgeon: Burnell Blanks, MD;  Location: [facility] CATH LAB;  Service: Cardiovascular;;  . Left heart catheterization with coronary/graft angiogram N/A 11/09/2012    Procedure: LEFT HEART CATHETERIZATION WITH [name];  Surgeon: Peter M Martinique, MD;  Location: [facility] CATH LAB;  Service: Cardiovascular;  Laterality: N/A;  . Left heart catheterization with coronary/graft angiogram N/A 05/19/2014    Procedure: LEFT HEART CATHETERIZATION WITH [name];  Surgeon: Troy Sine, MD;  Location: [facility] CATH LAB;  Service: Cardiovascular;  Laterality: N/A;  . Egd with  foreign body removal  08/13/2009    Food impaction   Social History   Social History  . Marital Status: Divorced    Spouse Name: N/A  . Number of Children: N/A  . Years of Education: N/A   Occupational History  . Retired     Designer, television/film set   Social History Main Topics  . Smoking status: Former Smoker -- 2.00 packs/day for 10  years    Types: Cigarettes    Start date: 07/14/1950    Quit date: 07/14/1961  . Smokeless tobacco: Current User    Types: Chew  . Alcohol Use: No  . Drug Use: No  . Sexual Activity: No   Other Topics Concern  . None   Social History Narrative   ** Merged History Encounter **...   He was raised by his uncle.  His mother deceased when   he was 63 year old with appendicitis. Father deceased from an MI at age   84.  He has 1 sister, but he does not know her health status as they   have been separated.          Outpatient Encounter Prescriptions as of 10/17/2015  Medication Sig  . metFORMIN (GLUCOPHAGE) 1000 MG tablet Take 1,000 mg by mouth 2 (two) times daily with a meal.  . acetaminophen (TYLENOL) 325 MG tablet Take 2 tablets (650 mg total) by mouth every 4 (four) hours as needed for headache or mild pain. (Patient not taking: Reported on 10/10/2015)  . albuterol (PROVENTIL HFA;VENTOLIN HFA) 108 (90 BASE) MCG/ACT inhaler Inhale 2 puffs into the lungs every 6 (six) hours as needed for wheezing or shortness of breath.  . ALPRAZolam (XANAX) 0.25 MG tablet Take 0.25 mg by mouth 2 (two) times daily.  Marland Kitchen aspirin EC 81 MG tablet Take 81 mg by mouth daily.  Marland Kitchen atorvastatin (LIPITOR) 40 MG tablet Take 1 tablet (40 mg total) by mouth daily at 6 PM. (Patient not taking: Reported on 10/10/2015)  . bismuth subsalicylate (PEPTO BISMOL) 262 MG/15ML suspension Take 30 mLs by mouth every 6 (six) hours as needed for indigestion or diarrhea or loose stools.   . carvedilol (COREG) 3.125 MG tablet TAKE 1 TABLET BY MOUTH TWICE DAILY WITH MEALS.  Marland Kitchen empagliflozin (JARDIANCE) 10 MG TABS tablet Take 10 mg by mouth daily before breakfast.  . famotidine (PEPCID) 20 MG tablet Take 1 tablet (20 mg total) by mouth at bedtime.  . furosemide (LASIX) 20 MG tablet Take 20 mg by mouth as needed for fluid. Reported on 10/10/2015  . isosorbide mononitrate (IMDUR) 30 MG 24 hr tablet Take 1.5 tablets (45 mg total) by mouth daily.   Marland Kitchen levothyroxine (SYNTHROID, LEVOTHROID) 50 MCG tablet Take 50 mcg by mouth daily before breakfast. Reported on 10/10/2015  . loperamide (IMODIUM) 2 MG capsule Take 1 capsule (2 mg total) by mouth 4 (four) times daily as needed for diarrhea or loose stools. (Patient not taking: Reported on 10/10/2015)  . Multiple Vitamin (MULTIVITAMIN WITH MINERALS) TABS Take 1 tablet by mouth daily. Reported on 10/10/2015  . NITROSTAT 0.4 MG SL tablet PLACE 1 TAB UNDER TONGUE EVERY 5 MIN IF NEEDED FOR CHEST PAIN. MAY USE 3 TIMES.NO RELIEF CALL 911. (Patient not taking: Reported on 10/10/2015)  . pantoprazole (PROTONIX) 40 MG tablet Take 40 mg by mouth daily.  . ranolazine (RANEXA) 500 MG 12 hr tablet Take 1 tablet (500 mg total) by mouth 2 (two) times daily.  . tamsulosin (FLOMAX) 0.4 MG CAPS Take 0.4 mg  by mouth 2 (two) times daily.   . ticagrelor (BRILINTA) 60 MG TABS tablet Take 1 tablet (60 mg total) by mouth 2 (two) times daily.  . [DISCONTINUED] metFORMIN (GLUCOPHAGE) 500 MG tablet Take 2 tablets (1,000 mg total) by mouth 2 (two) times daily with a meal. RESTART ON Sunday, 03/04/15. (Patient taking differently: Take 500 mg by mouth 2 (two) times daily with a meal. )   No facility-administered encounter medications on file as of 10/17/2015.   ALLERGIES: No Known Allergies VACCINATION STATUS: Immunization History  Administered Date(s) Administered  . Influenza Split 05/08/2011  . Pneumococcal Conjugate-13 04/13/2012  . Tdap 05/09/2013, 03/27/2014, 12/13/2014    Diabetes He presents for his initial diabetic visit. He has type 2 diabetes mellitus. Onset time: He was diagnosed at approximate age of 88 years. His disease course has been stable. There are no hypoglycemic associated symptoms. Pertinent negatives for hypoglycemia include no confusion, headaches, pallor or seizures. There are no diabetic associated symptoms. Pertinent negatives for diabetes include no chest pain, no fatigue, no polydipsia, no  polyphagia, no polyuria and no weakness. There are no hypoglycemic complications. Symptoms are stable. Diabetic complications include heart disease, PVD and retinopathy. Risk factors for coronary artery disease include diabetes mellitus, dyslipidemia, hypertension, male sex, sedentary lifestyle and tobacco exposure. Current diabetic treatment includes oral agent (monotherapy). Compliance with diabetes treatment: His 82 year old elderly man who lives alone. His weight is stable. He is following a generally unhealthy diet. When asked about meal planning, he reported none. He has not had a previous visit with a dietitian. He rarely participates in exercise. Home blood sugar record trend: He came with some letter glucose readings documented by EMS showing between 100-175, no hypoglycemic.  Hyperlipidemia This is a chronic problem. The current episode started more than 1 year ago. Exacerbating diseases include diabetes. Pertinent negatives include no chest pain, myalgias or shortness of breath. Current antihyperlipidemic treatment includes statins. Risk factors for coronary artery disease include diabetes mellitus, dyslipidemia and hypertension.  Hypertension This is a chronic problem. The current episode started more than 1 year ago. Pertinent negatives include no chest pain, headaches, neck pain, palpitations or shortness of breath. Risk factors for coronary artery disease include smoking/tobacco exposure, male gender and diabetes mellitus. Hypertensive end-organ damage includes PVD, retinopathy and a thyroid problem.  Thyroid Problem Presents for initial visit. Patient reports no constipation, diarrhea, fatigue or palpitations. Past treatments include levothyroxine. The treatment provided moderate relief. His past medical history is significant for diabetes and hyperlipidemia.    Review of Systems  Constitutional: Negative for fever, chills, fatigue and unexpected weight change.  HENT: Negative for dental  problem, mouth sores and trouble swallowing.   Eyes: Negative for visual disturbance.  Respiratory: Negative for cough, choking, chest tightness, shortness of breath and wheezing.   Cardiovascular: Negative for chest pain, palpitations and leg swelling.  Gastrointestinal: Negative for nausea, vomiting, abdominal pain, diarrhea, constipation and abdominal distention.  Endocrine: Negative for polydipsia, polyphagia and polyuria.  Genitourinary: Negative for dysuria, urgency, hematuria and flank pain.  Musculoskeletal: Negative for myalgias, back pain, gait problem and neck pain.  Skin: Negative for pallor, rash and wound.  Neurological: Negative for seizures, syncope, weakness, numbness and headaches.  Psychiatric/Behavioral: Negative.  Negative for confusion and dysphoric mood.    Objective:    BP 144/79 mmHg  Pulse 82  Ht 5\' 10"  (1.778 m)  Wt 171 lb (77.565 kg)  BMI 24.54 kg/m2  SpO2 96%  Wt Readings from Last 3 Encounters:  10/17/15 171 lb (77.565 kg)  10/10/15 171 lb (77.565 kg)  10/02/15 170 lb (77.111 kg)    Physical Exam  Constitutional: He is oriented to person, place, and time. He appears well-developed and well-nourished. He is cooperative. No distress.  HENT:  Head: Normocephalic and atraumatic.  Eyes: EOM are normal.  Neck: Normal range of motion. Neck supple. No tracheal deviation present. No thyromegaly present.  Cardiovascular: Normal rate, S1 normal, S2 normal and normal heart sounds.  Exam reveals no gallop.   No murmur heard. Pulses:      Dorsalis pedis pulses are 1+ on the right side, and 1+ on the left side.       Posterior tibial pulses are 1+ on the right side, and 1+ on the left side.  Pulmonary/Chest: Breath sounds normal. No respiratory distress. He has no wheezes.  Abdominal: Soft. Bowel sounds are normal. He exhibits no distension. There is no tenderness. There is no guarding and no CVA tenderness.  Musculoskeletal: He exhibits no edema.       Right  shoulder: He exhibits no swelling and no deformity.  Neurological: He is alert and oriented to person, place, and time. He has normal strength and normal reflexes. No cranial nerve deficit or sensory deficit. Gait normal.  Skin: Skin is warm and dry. No rash noted. No cyanosis. Nails show no clubbing.  Psychiatric: He has a normal mood and affect. His speech is normal and behavior is normal. Judgment and thought content normal. Cognition and memory are normal.    CMP     Component Value Date/Time   NA 137 10/02/2015 1302   K 4.1 10/02/2015 1302   CL 104 10/02/2015 1302   CO2 23 10/02/2015 1302   GLUCOSE 172* 10/02/2015 1302   BUN 13 10/02/2015 1302   CREATININE 0.82 10/02/2015 1302   CREATININE 0.94 06/21/2014 1501   CALCIUM 9.1 10/02/2015 1302   PROT 6.6 09/19/2015 1629   ALBUMIN 3.9 09/19/2015 1629   AST 26 09/19/2015 1629   ALT 26 09/19/2015 1629   ALKPHOS 81 09/19/2015 1629   BILITOT 0.5 09/19/2015 1629   GFRNONAA >60 10/02/2015 1302   GFRNONAA 75 06/21/2014 1501   GFRAA >60 10/02/2015 1302   GFRAA 86 06/21/2014 1501   Diabetic Labs (most recent): Lab Results  Component Value Date   HGBA1C 9.2* 03/02/2015   HGBA1C 8.6* 06/01/2014   HGBA1C 8.1* 05/18/2014     Lipid Panel ( most recent) Lipid Panel     Component Value Date/Time   CHOL 190 05/29/2013 0410   TRIG 335* 05/29/2013 0410   HDL 33* 05/29/2013 0410   CHOLHDL 5.8 05/29/2013 0410   VLDL 67* 05/29/2013 0410   LDLCALC 90 05/29/2013 0410      Assessment & Plan:   1. DM type 2 causing vascular disease ([place]) - Patient has currently  Fairly controlled  type 2 DM since 82 years of age,  with most recent A1c of 8.2%. Recent labs reviewed, showing normal renal function.    -his  diabetes is complrecurrent coronary artery disease which required coronary artery bypass graft and stent placement on several occasions and patient remains at a high risk for more acute and chronic complications of diabetes which include  CAD, CVA, CKD, retinopathy, and neuropathy. These are all discussed in detail with the patient.  - I have counseled the patient on diet management  by adopting a carbohydrate restricted/protein rich diet.  - Suggestion is made for patient to avoid simple carbohydrates  from their diet including Cakes , Desserts, Ice Cream,  Soda (  diet and regular) , Sweet Tea , Candies,  Chips, Cookies, Artificial Sweeteners,   and "Sugar-free" Products . This will help patient to have stable blood glucose profile and potentially avoid unintended weight gain.  - I encouraged the patient to switch to  unprocessed or minimally processed complex starch and increased protein intake (animal or plant source), fruits, and vegetables.  - Patient is advised to stick to a routine mealtimes to eat 3 meals  a day and avoid unnecessary snacks ( to snack only to correct hypoglycemia).  - The patient will be scheduled with Jearld Fenton, RDN, CDE for individualized DM education.  - I have approached patient with the following individualized plan to manage diabetes and patient agrees:   -   He is an elderly man at age 56 year old lives alone, too high risk to give insulin therapy. - I will plan to management regimen which will not put him at risk for hypoglycemia but will be sufficient to control his diabetes.   - I have readjusted his metformin to 1000 g by mouth twice a day and add Jardiance 10 mg by mouth every morning. Side effects and precautions discussed with him. He is educated on proper hydration.   -  He is not a good candidate for incretin therapy.  - Patient specific target  A1c;  LDL, HDL, Triglycerides, and  Waist Circumference were discussed in detail.  2) BP/HTN:controlled. Continue current medications. 3) Lipids/HPL:   continue statins. 4)  Weight/Diet: CDE Consult will be initiated , exercise, and detailed carbohydrates information provided.  5) Hypothyroidism, unspecified hypothyroidism type - His  current thyroid function tests are consistent with appropriate replacement. I will continue levothyroxine 50 g by mouth every morning.  - We discussed about correct intake of levothyroxine, at fasting, with water, separated by at least 30 minutes from breakfast, and separated by more than 4 hours from calcium, iron, multivitamins, acid reflux medications (PPIs). -Patient is made aware of the fact that thyroid hormone replacement is needed for life, dose to be adjusted by periodic monitoring of thyroid function tests.  6) Chronic Care/Health Maintenance:  -Patient  Is on Statin medications and encouraged to continue to follow up with Ophthalmology, Podiatrist at least yearly or according to recommendations, and advised to   stay away from smoking. I have recommended yearly flu vaccine and pneumonia vaccination at least every 5 years; moderate intensity exercise for up to 150 minutes weekly; and  sleep for at least 7 hours a day. - Long-term treatment plan for this patient must include consideration of placing him in a group home or a nursing home due to his age and several comorbidities making him high risk patient to live alone. I discussed this with Reyne Dumas, integrated case manager for [facility].  - 60 minutes of time was spent on the care of this patient , 50% of which was applied for counseling on diabetes complications and their preventions.  - Patient to bring meter and  blood glucose logs during their next visit.   - I advised patient to maintain close follow up with Rogers Blocker, MD for primary care needs.  Follow up plan: - Return in about 8 weeks (around 12/12/2015) for follow up with pre-visit labs, diabetes, high blood pressure, high cholesterol, underactive thyroid.  Glade Lloyd, MD Phone: [phone]  Fax: [phone]   10/17/2015, 5:54 PM

## 2015-10-29 ENCOUNTER — Telehealth: Payer: Self-pay

## 2015-10-29 NOTE — Telephone Encounter (Signed)
Jeffrey [name] is not covered by pts insurance. Invokana or Wilder Glade are preferred.

## 2015-10-29 NOTE — Telephone Encounter (Signed)
Proceed with Invokana 100 mg by mouth every morning.

## 2015-10-30 ENCOUNTER — Telehealth: Payer: Self-pay | Admitting: Cardiovascular Disease

## 2015-10-30 MED ORDER — CANAGLIFLOZIN 100 MG PO TABS
100.0000 mg | ORAL_TABLET | Freq: Every day | ORAL | Status: DC
Start: 2015-10-30 — End: 2015-11-12

## 2015-10-30 NOTE — Telephone Encounter (Signed)
Ella Bodo from [facility] left a voicemail stating would like to speak w/ someone about Mr. Nevills, states she's been doing in home visits w/ him through a new in home program [place]/ the county, he's starting to show increased SOB and weakness w/ each visit. Joni can be reached at [phone]

## 2015-10-30 NOTE — Telephone Encounter (Signed)
Left message with Ella Bodo to return my call regarding pt.

## 2015-10-31 NOTE — Telephone Encounter (Signed)
Jeffrey [name] walked into the office and I spoke with her about pt. And she stated  that she spends 3 days per week with the pt ( taking him to doctor visits, getting medications in order,etc.) and she has noticed he has been more SOB and fatigued over the past couple of visits. She does do a 12 lead on him weekly and they are unchanged.  She stated that folding a load of laundry wiped him out totally. She was trying to get him to visit with his doctors, as he has an appt. With a new PCP tomorrow afternoon. I made pt an appointment at the [place] office for Monday 4/24 @ 3:40 to see Dr. Bronson Ing to be evaluated. I let Jeffrey know if I could get him in sooner I would call her.

## 2015-11-01 ENCOUNTER — Encounter: Payer: Self-pay | Admitting: Cardiovascular Disease

## 2015-11-01 ENCOUNTER — Ambulatory Visit (INDEPENDENT_AMBULATORY_CARE_PROVIDER_SITE_OTHER): Payer: Medicare Other | Admitting: Cardiovascular Disease

## 2015-11-01 VITALS — BP 100/62 | HR 96 | Ht 70.0 in | Wt 171.0 lb

## 2015-11-01 DIAGNOSIS — I209 Angina pectoris, unspecified: Secondary | ICD-10-CM | POA: Diagnosis not present

## 2015-11-01 DIAGNOSIS — I10 Essential (primary) hypertension: Secondary | ICD-10-CM | POA: Diagnosis not present

## 2015-11-01 DIAGNOSIS — R0609 Other forms of dyspnea: Secondary | ICD-10-CM | POA: Diagnosis not present

## 2015-11-01 DIAGNOSIS — I25708 Atherosclerosis of coronary artery bypass graft(s), unspecified, with other forms of angina pectoris: Secondary | ICD-10-CM

## 2015-11-01 DIAGNOSIS — E785 Hyperlipidemia, unspecified: Secondary | ICD-10-CM | POA: Diagnosis not present

## 2015-11-01 DIAGNOSIS — R531 Weakness: Secondary | ICD-10-CM

## 2015-11-01 NOTE — Patient Instructions (Signed)
Continue all current medications. Follow up in  3 months 

## 2015-11-01 NOTE — Progress Notes (Signed)
Patient ID: Jeffrey [name], male   DOB: [birth year], 82 y.o.   MRN: [id number]      SUBJECTIVE: The patient presents for follow up of CAD and CABG. Has had frequent ED visits for chest pain.   He has reportedly been more short of breath and fatigued over the past few home visits.  05/2014 cath: LVEF 35%, occluded LAD, occluded LCX, occluded RCA. Patent LIMA-LAD, occluded SVG to OM1/OM2, occluded SVG-RCA. Patient with collaterals to RCA and OM territories.   Echo 05/27/15 LVEF 45-50% and grade I diastolic dysfunction.  He is here with an integrated health team consisting of a paramedic and a Education officer, museum. He has been more short of breath and fatigued with exertion. Echo in November 2016 showed EF 45-50%. Chest x-ray on 3/18 did not show evidence of congestive heart failure. He had been noncompliant with medications. He refuses to stay in assisted living. The paramedic is discussing rehabilitation at [place].  Lives by himself. No longer goes out to eat.   Review of Systems: As per "subjective", otherwise negative.  No Known Allergies  Current Outpatient Prescriptions  Medication Sig Dispense Refill  . albuterol (PROVENTIL HFA;VENTOLIN HFA) 108 (90 BASE) MCG/ACT inhaler Inhale 2 puffs into the lungs every 6 (six) hours as needed for wheezing or shortness of breath.    . ALPRAZolam (XANAX) 0.25 MG tablet Take 0.25 mg by mouth 2 (two) times daily.    Marland Kitchen aspirin EC 81 MG tablet Take 81 mg by mouth daily.    Marland Kitchen atorvastatin (LIPITOR) 40 MG tablet Take 1 tablet (40 mg total) by mouth daily at 6 PM. 30 tablet 11  . canagliflozin (INVOKANA) 100 MG TABS tablet Take 1 tablet (100 mg total) by mouth daily before breakfast. 30 tablet 2  . carvedilol (COREG) 3.125 MG tablet TAKE 1 TABLET BY MOUTH TWICE DAILY WITH MEALS. 60 tablet 6  . empagliflozin (JARDIANCE) 10 MG TABS tablet Take 10 mg by mouth daily before breakfast. 30 tablet 2  . famotidine (PEPCID) 20 MG tablet Take 1 tablet (20 mg total) by  mouth at bedtime. 30 tablet 3  . furosemide (LASIX) 20 MG tablet Take 20 mg by mouth as needed for fluid. Reported on 10/10/2015    . isosorbide mononitrate (IMDUR) 30 MG 24 hr tablet Take 1.5 tablets (45 mg total) by mouth daily. 45 tablet 11  . levothyroxine (SYNTHROID, LEVOTHROID) 50 MCG tablet Take 50 mcg by mouth daily before breakfast. Reported on 10/10/2015    . loperamide (IMODIUM) 2 MG capsule Take 1 capsule (2 mg total) by mouth 4 (four) times daily as needed for diarrhea or loose stools. 12 capsule 0  . metFORMIN (GLUCOPHAGE) 1000 MG tablet Take 1,000 mg by mouth 2 (two) times daily with a meal.    . Multiple Vitamin (MULTIVITAMIN WITH MINERALS) TABS Take 1 tablet by mouth daily. Reported on 10/10/2015    . NITROSTAT 0.4 MG SL tablet PLACE 1 TAB UNDER TONGUE EVERY 5 MIN IF NEEDED FOR CHEST PAIN. MAY USE 3 TIMES.NO RELIEF CALL 911. 25 tablet 3  . pantoprazole (PROTONIX) 40 MG tablet Take 40 mg by mouth daily.    . tamsulosin (FLOMAX) 0.4 MG CAPS Take 0.4 mg by mouth 2 (two) times daily.     . ticagrelor (BRILINTA) 60 MG TABS tablet Take 1 tablet (60 mg total) by mouth 2 (two) times daily. 60 tablet 3  . acetaminophen (TYLENOL) 325 MG tablet Take 2 tablets (650 mg total) by mouth every 4 (  four) hours as needed for headache or mild pain. (Patient not taking: Reported on 10/10/2015)    . bismuth subsalicylate (PEPTO BISMOL) 262 MG/15ML suspension Take 30 mLs by mouth every 6 (six) hours as needed for indigestion or diarrhea or loose stools.      No current facility-administered medications for this visit.    Past Medical History  Diagnosis Date  . Diabetes mellitus, type II ([place])   . Hyperlipidemia   . Coronary atherosclerosis of native coronary artery     a. CABG x 4 in 1989 (VG->OM1->OM2, VG->RCA, LIMA->LAD), b. 05/2010: DES to VG-OM1/OM2, DES to distal LCx. c. NSTEMI in 04/2011 - TO distal LCX stent and VG->OM2. d. 02/2012 NSTEMI DES to VG-OM1/continuation to OM2 occluded. e. inferior  STEMI s/p DES to SVG-RAMUS 06/2012. f. inferolat STEMI 09/2012 s/p DES to SVG-interm; g. Lex MV (11/14):  EF 35%, inf-lat scar with small peri-infarct ischemia  . Essential hypertension, benign   . Osteoarthritis   . History of stroke   . History of pneumonia   . Cervical vertebral fracture ([place])   . Chronic back pain   . Benign prostatic hypertrophy     History of urinary retention  . Peptic ulcer disease   . Gastroesophageal reflux disease   . Ischemic cardiomyopathy Nov 2015    EF 35% cath, 45-50% by echo  . Hypothyroidism   . Anxiety about health     "multiple somatic complaints"  . Chronic chest pain     Past Surgical History  Procedure Laterality Date  . Tonsillectomy    . Coronary artery bypass graft  1989  . Coronary angioplasty  10/12, 8/13, 12/13, 3/14    SVG-OM PCI  . Cardiac catheterization  05/19/14    SVG-OM occl- medical Rx  . Left heart catheterization with coronary angiogram N/A 11/01/2011    Procedure: LEFT HEART CATHETERIZATION WITH CORONARY ANGIOGRAM;  Surgeon: Lorretta Harp, MD;  Location: [facility] CATH LAB;  Service: Cardiovascular;  Laterality: N/A;  . Percutaneous coronary stent intervention (pci-s) N/A 11/01/2011    Procedure: PERCUTANEOUS CORONARY STENT INTERVENTION (PCI-S);  Surgeon: Lorretta Harp, MD;  Location: [facility] CATH LAB;  Service: Cardiovascular;  Laterality: N/A;  . Left heart catheterization with coronary/graft angiogram N/A 03/10/2012    Procedure: LEFT HEART CATHETERIZATION WITH [name];  Surgeon: Sherren Mocha, MD;  Location: [facility] CATH LAB;  Service: Cardiovascular;  Laterality: N/A;  . Left heart catheterization with coronary angiogram N/A 06/18/2012    Procedure: LEFT HEART CATHETERIZATION WITH CORONARY ANGIOGRAM;  Surgeon: Peter M Martinique, MD;  Location: [facility] CATH LAB;  Service: Cardiovascular;  Laterality: N/A;  . Percutaneous coronary stent intervention (pci-s)  06/18/2012    Procedure: PERCUTANEOUS CORONARY STENT INTERVENTION  (PCI-S);  Surgeon: Peter M Martinique, MD;  Location: [facility] CATH LAB;  Service: Cardiovascular;;  . Left heart catheterization with coronary/graft angiogram  10/06/2012    Procedure: LEFT HEART CATHETERIZATION WITH [name];  Surgeon: Burnell Blanks, MD;  Location: [facility] CATH LAB;  Service: Cardiovascular;;  . Percutaneous coronary stent intervention (pci-s)  10/06/2012    Procedure: PERCUTANEOUS CORONARY STENT INTERVENTION (PCI-S);  Surgeon: Burnell Blanks, MD;  Location: [facility] CATH LAB;  Service: Cardiovascular;;  . Left heart catheterization with coronary/graft angiogram N/A 11/09/2012    Procedure: LEFT HEART CATHETERIZATION WITH [name];  Surgeon: Peter M Martinique, MD;  Location: [facility] CATH LAB;  Service: Cardiovascular;  Laterality: N/A;  . Left heart catheterization with coronary/graft angiogram N/A 05/19/2014    Procedure: LEFT  HEART CATHETERIZATION WITH [name];  Surgeon: Troy Sine, MD;  Location: [facility] CATH LAB;  Service: Cardiovascular;  Laterality: N/A;  . Egd with foreign body removal  08/13/2009    Food impaction    Social History   Social History  . Marital Status: Divorced    Spouse Name: N/A  . Number of Children: N/A  . Years of Education: N/A   Occupational History  . Retired     Designer, television/film set   Social History Main Topics  . Smoking status: Former Smoker -- 2.00 packs/day for 10 years    Types: Cigarettes    Start date: 07/14/1950    Quit date: 07/14/1961  . Smokeless tobacco: Current User    Types: Chew  . Alcohol Use: No  . Drug Use: No  . Sexual Activity: No   Other Topics Concern  . Not on file   Social History Narrative   ** Merged History Encounter **...   He was raised by his uncle.  His mother deceased when   he was 6 year old with appendicitis. Father deceased from an MI at age   67.  He has 1 sister, but he does not know her health status as they   have been separated.            Filed Vitals:    11/01/15 1515  BP: 100/62  Pulse: 96  Height: 5\' 10"  (1.778 m)  Weight: 171 lb (77.565 kg)  SpO2: 98%    PHYSICAL EXAM General: NAD HEENT: Normal. Neck: No JVD, no thyromegaly. Lungs: Diminished throughout, no rales/wheezes. CV: Nondisplaced PMI. Regular rate and rhythm, normal S1/S2, no S3/S4, no murmur. No pretibial or periankle edema.  Abdomen: Soft, no distention.  Neurologic: Alert and oriented.  Psych: Normal affect. Skin: Normal. Musculoskeletal: No gross deformities. Extremities: No clubbing or cyanosis.   ECG: Most recent ECG reviewed.     ASSESSMENT AND PLAN: 1. Shortness of breath in context of CAD/CABG: While symptoms may be related to a cardiac etiology, has only a patent LIMA. Unable to increase Coreg or Imdur as BP would get too low. Continue current therapy with ASA , Lipitor, low-dose Coreg, Imdur, and Brilinta (given multiple coronary interventions). Would add Ranexa if he develops recurrent chest pain.  2. Essential HTN: Low normal on Coreg. No changes.  3. Hyperlipidemia: Continue Lipitor 40 mg daily.  Dispo: f/u with APP in [place] in 3 months.  I think he would benefit from assisted living. At the very least, he would benefit from a short stay in rehabilitation. I will defer to PCP for arranging this.   [name], M.D., F.A.C.C.

## 2015-11-05 ENCOUNTER — Ambulatory Visit: Payer: Medicare Other | Admitting: Cardiovascular Disease

## 2015-11-12 ENCOUNTER — Other Ambulatory Visit: Payer: Self-pay | Admitting: "Endocrinology

## 2015-11-23 ENCOUNTER — Emergency Department (HOSPITAL_COMMUNITY): Payer: Medicare Other

## 2015-11-23 ENCOUNTER — Encounter (HOSPITAL_COMMUNITY): Payer: Self-pay | Admitting: Cardiology

## 2015-11-23 ENCOUNTER — Emergency Department (HOSPITAL_COMMUNITY)
Admission: EM | Admit: 2015-11-23 | Discharge: 2015-11-23 | Disposition: A | Discharge status: Home / Self Care | Payer: Medicare Other | Attending: Emergency Medicine | Admitting: Emergency Medicine

## 2015-11-23 DIAGNOSIS — R079 Chest pain, unspecified: Secondary | ICD-10-CM | POA: Diagnosis present

## 2015-11-23 DIAGNOSIS — Z8701 Personal history of pneumonia (recurrent): Secondary | ICD-10-CM | POA: Diagnosis not present

## 2015-11-23 DIAGNOSIS — M1990 Unspecified osteoarthritis, unspecified site: Secondary | ICD-10-CM | POA: Diagnosis not present

## 2015-11-23 DIAGNOSIS — Z7984 Long term (current) use of oral hypoglycemic drugs: Secondary | ICD-10-CM | POA: Insufficient documentation

## 2015-11-23 DIAGNOSIS — R0602 Shortness of breath: Secondary | ICD-10-CM | POA: Insufficient documentation

## 2015-11-23 DIAGNOSIS — E119 Type 2 diabetes mellitus without complications: Secondary | ICD-10-CM | POA: Insufficient documentation

## 2015-11-23 DIAGNOSIS — I10 Essential (primary) hypertension: Secondary | ICD-10-CM | POA: Diagnosis not present

## 2015-11-23 DIAGNOSIS — I2510 Atherosclerotic heart disease of native coronary artery without angina pectoris: Secondary | ICD-10-CM | POA: Diagnosis not present

## 2015-11-23 DIAGNOSIS — Z87891 Personal history of nicotine dependence: Secondary | ICD-10-CM | POA: Diagnosis not present

## 2015-11-23 DIAGNOSIS — Z7982 Long term (current) use of aspirin: Secondary | ICD-10-CM | POA: Diagnosis not present

## 2015-11-23 DIAGNOSIS — E785 Hyperlipidemia, unspecified: Secondary | ICD-10-CM | POA: Diagnosis not present

## 2015-11-23 DIAGNOSIS — E039 Hypothyroidism, unspecified: Secondary | ICD-10-CM | POA: Diagnosis not present

## 2015-11-23 DIAGNOSIS — Z79899 Other long term (current) drug therapy: Secondary | ICD-10-CM | POA: Insufficient documentation

## 2015-11-23 LAB — CBC
HCT: 39.6 % (ref 39.0–52.0)
Hemoglobin: 13.2 g/dL (ref 13.0–17.0)
MCH: 31 pg (ref 26.0–34.0)
MCHC: 33.3 g/dL (ref 30.0–36.0)
MCV: 93 fL (ref 78.0–100.0)
PLATELETS: 161 10*3/uL (ref 150–400)
RBC: 4.26 MIL/uL (ref 4.22–5.81)
RDW: 13.6 % (ref 11.5–15.5)
WBC: 6.4 10*3/uL (ref 4.0–10.5)

## 2015-11-23 LAB — BASIC METABOLIC PANEL
Anion gap: 9 (ref 5–15)
BUN: 21 mg/dL — AB (ref 6–20)
CO2: 21 mmol/L — ABNORMAL LOW (ref 22–32)
CREATININE: 0.94 mg/dL (ref 0.61–1.24)
Calcium: 9.2 mg/dL (ref 8.9–10.3)
Chloride: 103 mmol/L (ref 101–111)
Glucose, Bld: 166 mg/dL — ABNORMAL HIGH (ref 65–99)
Potassium: 4.3 mmol/L (ref 3.5–5.1)
SODIUM: 133 mmol/L — AB (ref 135–145)

## 2015-11-23 LAB — TROPONIN I

## 2015-11-23 MED ORDER — ASPIRIN 81 MG PO CHEW
324.0000 mg | CHEWABLE_TABLET | Freq: Once | ORAL | Status: AC
  Administered 2015-11-23: 243 mg via ORAL
  Filled 2015-11-23: qty 4

## 2015-11-23 NOTE — ED Notes (Signed)
Pt unable to find any family that can come & get him. Pt says [employer] has given him a ride in the past, asking for [place] to call & see if they will extend him that favor again. Secretary to call.

## 2015-11-23 NOTE — ED Provider Notes (Signed)
CSN: [id number]     Arrival date & time 11/23/15  1714 History   First MD Initiated Contact with Patient 11/23/15 1727     Chief Complaint  Patient presents with  . Chest Pain     (Consider location/radiation/quality/duration/timing/severity/associated sxs/prior Treatment) HPI  82 year old male who frequently presents to the ER with chest pain presents with chest pain since this morning. He states it started sometime before noon. He cannot really describe the pain besides stating that it "hurt". It was in his left chest. He states he is chronically short of breath but think it may have been slightly worse during the episode of pain. He took Pepto-Bismol which did not relieve his pain. He then took 2 nitroglycerin and now he has pain free.   Past Medical History  Diagnosis Date  . Diabetes mellitus, type II ([place])   . Hyperlipidemia   . Coronary atherosclerosis of native coronary artery     a. CABG x 4 in 1989 (VG->OM1->OM2, VG->RCA, LIMA->LAD), b. 05/2010: DES to VG-OM1/OM2, DES to distal LCx. c. NSTEMI in 04/2011 - TO distal LCX stent and VG->OM2. d. 02/2012 NSTEMI DES to VG-OM1/continuation to OM2 occluded. e. inferior STEMI s/p DES to SVG-RAMUS 06/2012. f. inferolat STEMI 09/2012 s/p DES to SVG-interm; g. Lex MV (11/14):  EF 35%, inf-lat scar with small peri-infarct ischemia  . Essential hypertension, benign   . Osteoarthritis   . History of stroke   . History of pneumonia   . Cervical vertebral fracture ([place])   . Chronic back pain   . Benign prostatic hypertrophy     History of urinary retention  . Peptic ulcer disease   . Gastroesophageal reflux disease   . Ischemic cardiomyopathy Nov 2015    EF 35% cath, 45-50% by echo  . Hypothyroidism   . Anxiety about health     "multiple somatic complaints"  . Chronic chest pain    Past Surgical History  Procedure Laterality Date  . Tonsillectomy    . Coronary artery bypass graft  1989  . Coronary angioplasty  10/12, 8/13, 12/13, 3/14     SVG-OM PCI  . Cardiac catheterization  05/19/14    SVG-OM occl- medical Rx  . Left heart catheterization with coronary angiogram N/A 11/01/2011    Procedure: LEFT HEART CATHETERIZATION WITH CORONARY ANGIOGRAM;  Surgeon: Lorretta Harp, MD;  Location: [facility] CATH LAB;  Service: Cardiovascular;  Laterality: N/A;  . Percutaneous coronary stent intervention (pci-s) N/A 11/01/2011    Procedure: PERCUTANEOUS CORONARY STENT INTERVENTION (PCI-S);  Surgeon: Lorretta Harp, MD;  Location: [facility] CATH LAB;  Service: Cardiovascular;  Laterality: N/A;  . Left heart catheterization with coronary/graft angiogram N/A 03/10/2012    Procedure: LEFT HEART CATHETERIZATION WITH [name];  Surgeon: Sherren Mocha, MD;  Location: [facility] CATH LAB;  Service: Cardiovascular;  Laterality: N/A;  . Left heart catheterization with coronary angiogram N/A 06/18/2012    Procedure: LEFT HEART CATHETERIZATION WITH CORONARY ANGIOGRAM;  Surgeon: Peter M Martinique, MD;  Location: [facility] CATH LAB;  Service: Cardiovascular;  Laterality: N/A;  . Percutaneous coronary stent intervention (pci-s)  06/18/2012    Procedure: PERCUTANEOUS CORONARY STENT INTERVENTION (PCI-S);  Surgeon: Peter M Martinique, MD;  Location: [facility] CATH LAB;  Service: Cardiovascular;;  . Left heart catheterization with coronary/graft angiogram  10/06/2012    Procedure: LEFT HEART CATHETERIZATION WITH [name];  Surgeon: Burnell Blanks, MD;  Location: [facility] CATH LAB;  Service: Cardiovascular;;  . Percutaneous coronary stent intervention (pci-s)  10/06/2012  Procedure: PERCUTANEOUS CORONARY STENT INTERVENTION (PCI-S);  Surgeon: Burnell Blanks, MD;  Location: [facility] CATH LAB;  Service: Cardiovascular;;  . Left heart catheterization with coronary/graft angiogram N/A 11/09/2012    Procedure: LEFT HEART CATHETERIZATION WITH [name];  Surgeon: Peter M Martinique, MD;  Location: [facility] CATH LAB;  Service: Cardiovascular;  Laterality: N/A;  . Left  heart catheterization with coronary/graft angiogram N/A 05/19/2014    Procedure: LEFT HEART CATHETERIZATION WITH [name];  Surgeon: Troy Sine, MD;  Location: [facility] CATH LAB;  Service: Cardiovascular;  Laterality: N/A;  . Egd with foreign body removal  08/13/2009    Food impaction   Family History  Problem Relation Age of Onset  . Early death      Parents died young  . Appendicitis Mother     Pt was 78 year old  . Heart attack Father 15  . Colon cancer Neg Hx    Social History  Substance Use Topics  . Smoking status: Former Smoker -- 2.00 packs/day for 10 years    Types: Cigarettes    Start date: 07/14/1950    Quit date: 07/14/1961  . Smokeless tobacco: Current User    Types: Chew  . Alcohol Use: No    Review of Systems  Respiratory: Positive for shortness of breath.   Cardiovascular: Positive for chest pain. Negative for leg swelling.  Gastrointestinal: Negative for vomiting and abdominal pain.  Musculoskeletal: Negative for back pain.  All other systems reviewed and are negative.     Allergies  Review of patient's allergies indicates no known allergies.  Home Medications   Prior to Admission medications   Medication Sig Start Date End Date Taking? Authorizing Provider  acetaminophen (TYLENOL) 325 MG tablet Take 2 tablets (650 mg total) by mouth every 4 (four) hours as needed for headache or mild pain. Patient not taking: Reported on 10/10/2015 05/21/14   Erlene Quan, PA-C  albuterol (PROVENTIL HFA;VENTOLIN HFA) 108 (90 BASE) MCG/ACT inhaler Inhale 2 puffs into the lungs every 6 (six) hours as needed for wheezing or shortness of breath.    Historical Provider, MD  ALPRAZolam Duanne Moron) 0.25 MG tablet Take 0.25 mg by mouth 2 (two) times daily.    Historical Provider, MD  aspirin EC 81 MG tablet Take 81 mg by mouth daily.    Historical Provider, MD  atorvastatin (LIPITOR) 40 MG tablet Take 1 tablet (40 mg total) by mouth daily at 6 PM. 05/29/13   Liliane Shi, PA-C  bismuth subsalicylate (PEPTO BISMOL) 262 MG/15ML suspension Take 30 mLs by mouth every 6 (six) hours as needed for indigestion or diarrhea or loose stools.     Historical Provider, MD  carvedilol (COREG) 3.125 MG tablet TAKE 1 TABLET BY MOUTH TWICE DAILY WITH MEALS. 07/26/15   Lendon Colonel, NP  empagliflozin (JARDIANCE) 10 MG TABS tablet Take 10 mg by mouth daily before breakfast. 10/17/15   Cassandria Anger, MD  famotidine (PEPCID) 20 MG tablet Take 1 tablet (20 mg total) by mouth at bedtime. 03/03/15   Rexene Alberts, MD  furosemide (LASIX) 20 MG tablet Take 20 mg by mouth as needed for fluid. Reported on 10/10/2015 05/06/13   Lendon Colonel, NP  INVOKANA 100 MG TABS tablet TAKE 1 TABLET BY MOUTH DAILY BEFORE BREAKFAST. 11/12/15   Cassandria Anger, MD  isosorbide mononitrate (IMDUR) 30 MG 24 hr tablet Take 1.5 tablets (45 mg total) by mouth daily. 03/03/15   Rexene Alberts, MD  levothyroxine ([place])  50 MCG tablet Take 50 mcg by mouth daily before breakfast. Reported on 10/10/2015 09/08/14   Historical Provider, MD  loperamide (IMODIUM) 2 MG capsule Take 1 capsule (2 mg total) by mouth 4 (four) times daily as needed for diarrhea or loose stools. 04/01/15   Kristen N Ward, DO  metFORMIN (GLUCOPHAGE) 1000 MG tablet Take 1,000 mg by mouth 2 (two) times daily with a meal.    Historical Provider, MD  Multiple Vitamin (MULTIVITAMIN WITH MINERALS) TABS Take 1 tablet by mouth daily. Reported on 10/10/2015    Historical Provider, MD  NITROSTAT 0.4 MG SL tablet PLACE 1 TAB UNDER TONGUE EVERY 5 MIN IF NEEDED FOR CHEST PAIN. MAY USE 3 TIMES.NO RELIEF CALL 911. 07/04/15   Lendon Colonel, NP  pantoprazole (PROTONIX) 40 MG tablet Take 40 mg by mouth daily.    Historical Provider, MD  tamsulosin (FLOMAX) 0.4 MG CAPS Take 0.4 mg by mouth 2 (two) times daily.  01/27/13   Historical Provider, MD  ticagrelor (BRILINTA) 60 MG TABS tablet Take 1 tablet (60 mg total) by mouth 2 (two)  times daily. 08/08/15   Herminio Commons, MD   BP 131/91 mmHg  Pulse 86  Temp(Src) 97.7 F (36.5 C) (Oral)  Resp 18  Ht 5\' 10"  (1.778 m)  Wt 171 lb (77.565 kg)  BMI 24.54 kg/m2  SpO2 98% Physical Exam  Constitutional: He is oriented to person, place, and time. He appears well-developed and well-nourished.  HENT:  Head: Normocephalic and atraumatic.  Right Ear: External ear normal.  Left Ear: External ear normal.  Nose: Nose normal.  Eyes: Right eye exhibits no discharge. Left eye exhibits no discharge.  Neck: Neck supple.  Cardiovascular: Normal rate, regular rhythm, normal heart sounds and intact distal pulses.   Pulses:      Radial pulses are 2+ on the right side, and 2+ on the left side.  Pulmonary/Chest: Effort normal and breath sounds normal. He has no wheezes. He has no rales.  Abdominal: Soft. He exhibits no distension. There is no tenderness.  Musculoskeletal: He exhibits no edema.  Neurological: He is alert and oriented to person, place, and time.  Skin: Skin is warm and dry.  Nursing note and vitals reviewed.   ED Course  Procedures (including critical care time) Labs Review Labs Reviewed  BASIC METABOLIC PANEL - Abnormal; Notable for the following:    Sodium 133 (*)    CO2 21 (*)    Glucose, Bld 166 (*)    BUN 21 (*)    All other components within normal limits  CBC  TROPONIN I  TROPONIN I    Imaging Review Dg Chest 2 View  11/23/2015  CLINICAL DATA:  Left-sided chest pain beginning today. EXAM: CHEST  2 VIEW COMPARISON:  09/29/2015 FINDINGS: Sternotomy wires unchanged. Lungs are adequately inflated without consolidation or effusion. Cardiomediastinal silhouette and remainder of the exam is unchanged. IMPRESSION: No active cardiopulmonary disease. Electronically Signed   By: Marin Olp M.D.   On: 11/23/2015 18:17   I have personally reviewed and evaluated these images and lab results as part of my medical decision-making.   EKG  Interpretation   Date/Time:  Friday Nov 23 2015 17:18:40 EDT Ventricular Rate:  88 PR Interval:  170 QRS Duration: 116 QT Interval:  395 QTC Calculation: 478 R Axis:   93 Text Interpretation:  Sinus rhythm Multiple ventricular premature  complexes Nonspecific intraventricular conduction delay Inferior infarct,  age indeterminate Baseline wander in lead(s) V6 ST/T  wave changes similar  to Mar 2017 Confirmed by Sheralee Qazi MD, [place] [phone]) on 11/23/2015 5:28:18  PM      MDM   Final diagnoses:  Chest pain, unspecified chest pain type    Review of records shows patient is here often (24 times in 6 mo) with similar chest pain. He has been pain free since prior to arrival and no recurrence. EKG unchanged. 2 negative troponins. I doubt ACS, PE or dissection. Will refer back to his PCP and discharge with return precautions.     Sherwood Gambler, MD 11/24/15 [phone]

## 2015-11-23 NOTE — ED Notes (Signed)
Pt in room resting with eyes shut.

## 2015-11-23 NOTE — ED Notes (Signed)
Here by EMS for Chest pain.  Pt took 2 SL NTG at home and 1 baby aspirin.  Pt pain free now.

## 2015-11-23 NOTE — ED Notes (Signed)
Pt was given discharge instructions & a ride was extended to pt by [employer].
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
## 2015-12-05 ENCOUNTER — Other Ambulatory Visit: Payer: Self-pay | Admitting: "Endocrinology

## 2015-12-06 LAB — HGB A1C W/O EAG: Hgb A1c MFr Bld: 8.1 % — ABNORMAL HIGH (ref 4.8–5.6)

## 2015-12-06 LAB — BASIC METABOLIC PANEL
BUN/Creatinine Ratio: 16 (ref 10–24)
BUN: 15 mg/dL (ref 8–27)
CHLORIDE: 96 mmol/L (ref 96–106)
CO2: 23 mmol/L (ref 18–29)
CREATININE: 0.96 mg/dL (ref 0.76–1.27)
Calcium: 10.3 mg/dL — ABNORMAL HIGH (ref 8.6–10.2)
GFR calc Af Amer: 84 mL/min/{1.73_m2} (ref >59)
GFR calc non Af Amer: 72 mL/min/{1.73_m2} (ref >59)
GLUCOSE: 146 mg/dL — AB (ref 65–99)
Potassium: 4.7 mmol/L (ref 3.5–5.2)
SODIUM: 138 mmol/L (ref 134–144)

## 2015-12-06 LAB — TSH: TSH: 2.71 u[IU]/mL (ref 0.450–4.500)

## 2015-12-06 LAB — LIPID PANEL W/O CHOL/HDL RATIO
CHOLESTEROL TOTAL: 118 mg/dL (ref 100–199)
HDL: 41 mg/dL (ref >39)
LDL Calculated: 56 mg/dL (ref 0–99)
TRIGLYCERIDES: 107 mg/dL (ref 0–149)
VLDL Cholesterol Cal: 21 mg/dL (ref 5–40)

## 2015-12-06 LAB — T4, FREE: Free T4: 1.32 ng/dL (ref 0.82–1.77)

## 2015-12-11 ENCOUNTER — Ambulatory Visit (INDEPENDENT_AMBULATORY_CARE_PROVIDER_SITE_OTHER): Payer: Medicare Other | Admitting: Nurse Practitioner

## 2015-12-11 ENCOUNTER — Encounter: Payer: Self-pay | Admitting: Nurse Practitioner

## 2015-12-11 VITALS — BP 98/52 | HR 82 | Temp 97.3°F | Ht 70.0 in | Wt 161.6 lb

## 2015-12-11 DIAGNOSIS — I209 Angina pectoris, unspecified: Secondary | ICD-10-CM

## 2015-12-11 DIAGNOSIS — K5900 Constipation, unspecified: Secondary | ICD-10-CM

## 2015-12-11 DIAGNOSIS — K219 Gastro-esophageal reflux disease without esophagitis: Secondary | ICD-10-CM | POA: Diagnosis not present

## 2015-12-11 NOTE — Progress Notes (Signed)
Referring Provider: Rogers Blocker, MD Primary Care Physician:  Jani Gravel, MD Primary GI:  Dr. Oneida Alar  Chief Complaint  Patient presents with  . Follow-up    HPI:   Jeffrey [name] is a 82 y.o. male who presents For follow-up on GERD and abdominal pain. He was last seen in our office 10/10/2015 accompanied by EMS and Education officer, [employer]. At that time he was taking Pepto-Bismol 6-8 times a day, Protonix. EMS noted noncompliance with multiple medications. Also with multiple emergency room visits for epigastric pain and chest pain. Was on Protonix but not taking it. EMS and social work would attempt to improve compliance with meds. Recommend follow-up in 2 months and if consistently taking PPIs with problems could consider upper endoscopy.  Today he is accompanied by EMS and social work. State medication compliance is much better. Per EMS he is not having many GERD problems. Patient states his GERD and chest pain much better. Taking Protonix regularly. Medications are provided in blister packs which helps him make sure he takes all his medications. Per EMS he has lost 10 pounds, has been evaluated by PCP thoroughly for this with no concerning findings. Also state patient is entering a facility tomorrow. Only recent breakthrough symptoms related to GERD was this weekend likely related to dietary intake. Denies persistent abdominal pain, N/V, chest pain. Still taking Pepto every time he chews tobacco. Is not wanting to quit chewing tobacco. Is having some constipation, EMS states  he doesn't take Miralax as recommended. Denies chest pain, dyspnea, dizziness, lightheadedness, syncope, near syncope. Denies any other upper or lower GI symptoms.  Past Medical History  Diagnosis Date  . Diabetes mellitus, type II ([place])   . Hyperlipidemia   . Coronary atherosclerosis of native coronary artery     a. CABG x 4 in 1989 (VG->OM1->OM2, VG->RCA, LIMA->LAD), b. 05/2010: DES to VG-OM1/OM2, DES to distal LCx. c.  NSTEMI in 04/2011 - TO distal LCX stent and VG->OM2. d. 02/2012 NSTEMI DES to VG-OM1/continuation to OM2 occluded. e. inferior STEMI s/p DES to SVG-RAMUS 06/2012. f. inferolat STEMI 09/2012 s/p DES to SVG-interm; g. Lex MV (11/14):  EF 35%, inf-lat scar with small peri-infarct ischemia  . Essential hypertension, benign   . Osteoarthritis   . History of stroke   . History of pneumonia   . Cervical vertebral fracture ([place])   . Chronic back pain   . Benign prostatic hypertrophy     History of urinary retention  . Peptic ulcer disease   . Gastroesophageal reflux disease   . Ischemic cardiomyopathy Nov 2015    EF 35% cath, 45-50% by echo  . Hypothyroidism   . Anxiety about health     "multiple somatic complaints"  . Chronic chest pain     Past Surgical History  Procedure Laterality Date  . Tonsillectomy    . Coronary artery bypass graft  1989  . Coronary angioplasty  10/12, 8/13, 12/13, 3/14    SVG-OM PCI  . Cardiac catheterization  05/19/14    SVG-OM occl- medical Rx  . Left heart catheterization with coronary angiogram N/A 11/01/2011    Procedure: LEFT HEART CATHETERIZATION WITH CORONARY ANGIOGRAM;  Surgeon: Lorretta Harp, MD;  Location: [facility] CATH LAB;  Service: Cardiovascular;  Laterality: N/A;  . Percutaneous coronary stent intervention (pci-s) N/A 11/01/2011    Procedure: PERCUTANEOUS CORONARY STENT INTERVENTION (PCI-S);  Surgeon: Lorretta Harp, MD;  Location: [facility] CATH LAB;  Service: Cardiovascular;  Laterality: N/A;  . Left heart  catheterization with coronary/graft angiogram N/A 03/10/2012    Procedure: LEFT HEART CATHETERIZATION WITH [name];  Surgeon: Sherren Mocha, MD;  Location: [facility] CATH LAB;  Service: Cardiovascular;  Laterality: N/A;  . Left heart catheterization with coronary angiogram N/A 06/18/2012    Procedure: LEFT HEART CATHETERIZATION WITH CORONARY ANGIOGRAM;  Surgeon: Peter M Martinique, MD;  Location: [facility] CATH LAB;  Service: Cardiovascular;  Laterality: N/A;   . Percutaneous coronary stent intervention (pci-s)  06/18/2012    Procedure: PERCUTANEOUS CORONARY STENT INTERVENTION (PCI-S);  Surgeon: Peter M Martinique, MD;  Location: [facility] CATH LAB;  Service: Cardiovascular;;  . Left heart catheterization with coronary/graft angiogram  10/06/2012    Procedure: LEFT HEART CATHETERIZATION WITH [name];  Surgeon: Burnell Blanks, MD;  Location: [facility] CATH LAB;  Service: Cardiovascular;;  . Percutaneous coronary stent intervention (pci-s)  10/06/2012    Procedure: PERCUTANEOUS CORONARY STENT INTERVENTION (PCI-S);  Surgeon: Burnell Blanks, MD;  Location: [facility] CATH LAB;  Service: Cardiovascular;;  . Left heart catheterization with coronary/graft angiogram N/A 11/09/2012    Procedure: LEFT HEART CATHETERIZATION WITH [name];  Surgeon: Peter M Martinique, MD;  Location: [facility] CATH LAB;  Service: Cardiovascular;  Laterality: N/A;  . Left heart catheterization with coronary/graft angiogram N/A 05/19/2014    Procedure: LEFT HEART CATHETERIZATION WITH [name];  Surgeon: Troy Sine, MD;  Location: [facility] CATH LAB;  Service: Cardiovascular;  Laterality: N/A;  . Egd with foreign body removal  08/13/2009    Food impaction    Current Outpatient Prescriptions  Medication Sig Dispense Refill  . acetaminophen (TYLENOL) 325 MG tablet Take 2 tablets (650 mg total) by mouth every 4 (four) hours as needed for headache or mild pain.    Marland Kitchen albuterol (PROVENTIL HFA;VENTOLIN HFA) 108 (90 BASE) MCG/ACT inhaler Inhale 2 puffs into the lungs every 6 (six) hours as needed for wheezing or shortness of breath.    . ALPRAZolam (XANAX) 0.25 MG tablet Take 0.25 mg by mouth 2 (two) times daily.    Marland Kitchen aspirin EC 81 MG tablet Take 81 mg by mouth daily.    Marland Kitchen atorvastatin (LIPITOR) 40 MG tablet Take 1 tablet (40 mg total) by mouth daily at 6 PM. 30 tablet 11  . bismuth subsalicylate (PEPTO BISMOL) 262 MG/15ML suspension Take 30 mLs by mouth every 6 (six)  hours as needed for indigestion or diarrhea or loose stools.     . carvedilol (COREG) 3.125 MG tablet TAKE 1 TABLET BY MOUTH TWICE DAILY WITH MEALS. 60 tablet 6  . isosorbide mononitrate (IMDUR) 30 MG 24 hr tablet Take 1.5 tablets (45 mg total) by mouth daily. 45 tablet 11  . levothyroxine (SYNTHROID, LEVOTHROID) 50 MCG tablet Take 50 mcg by mouth daily before breakfast. Reported on 10/10/2015    . loperamide (IMODIUM) 2 MG capsule Take 1 capsule (2 mg total) by mouth 4 (four) times daily as needed for diarrhea or loose stools. 12 capsule 0  . metFORMIN (GLUCOPHAGE) 1000 MG tablet Take 1,000 mg by mouth 2 (two) times daily with a meal.    . Multiple Vitamin (MULTIVITAMIN WITH MINERALS) TABS Take 1 tablet by mouth daily. Reported on 10/10/2015    . NITROSTAT 0.4 MG SL tablet PLACE 1 TAB UNDER TONGUE EVERY 5 MIN IF NEEDED FOR CHEST PAIN. MAY USE 3 TIMES.NO RELIEF CALL 911. 25 tablet 3  . pantoprazole (PROTONIX) 40 MG tablet Take 40 mg by mouth daily.    Marland Kitchen PARoxetine (PAXIL) 10 MG tablet Take 10 mg by mouth  daily.    . tamsulosin (FLOMAX) 0.4 MG CAPS Take 0.4 mg by mouth 2 (two) times daily.     . ticagrelor (BRILINTA) 60 MG TABS tablet Take 1 tablet (60 mg total) by mouth 2 (two) times daily. 60 tablet 3   No current facility-administered medications for this visit.    Allergies as of 12/11/2015  . (No Known Allergies)    Family History  Problem Relation Age of Onset  . Early death      Parents died young  . Appendicitis Mother     Pt was 89 year old  . Heart attack Father 54  . Colon cancer Neg Hx     Social History   Social History  . Marital Status: Divorced    Spouse Name: N/A  . Number of Children: N/A  . Years of Education: N/A   Occupational History  . Retired     Designer, television/film set   Social History Main Topics  . Smoking status: Former Smoker -- 2.00 packs/day for 10 years    Types: Cigarettes    Start date: 07/14/1950    Quit date: 07/14/1961  . Smokeless tobacco: Current User     Types: Chew  . Alcohol Use: No  . Drug Use: No  . Sexual Activity: No   Other Topics Concern  . None   Social History Narrative   ** Merged History Encounter **...   He was raised by his uncle.  His mother deceased when   he was 36 year old with appendicitis. Father deceased from an MI at age   80.  He has 1 sister, but he does not know her health status as they   have been separated.           Review of Systems: 10-point ROS negative except as per HPI.   Physical Exam: BP 98/52 mmHg  Pulse 82  Temp(Src) 97.3 F (36.3 C) (Oral)  Ht 5\' 10"  (1.778 m)  Wt 161 lb 9.6 oz (73.301 kg)  BMI 23.19 kg/m2 General:   Alert and oriented. Pleasant and cooperative. Well-nourished and well-developed.  Ears:  Normal auditory acuity. Cardiovascular:  S1, S2 present without murmurs appreciated. Normal pulses noted. Extremities without clubbing or edema. Respiratory:  Clear to auscultation bilaterally. No wheezes, rales, or rhonchi. No distress.  Gastrointestinal:  +BS, soft, non-tender and non-distended. No HSM noted. No guarding or rebound. No masses appreciated.  Rectal:  Deferred  Neurologic:  Alert and oriented x4;  grossly normal neurologically. Psych:  Alert and cooperative. Normal mood and affect. Heme/Lymph/Immune: No excessive bruising noted.    12/14/2015 4:21 PM   Disclaimer: This note was dictated with voice recognition software. Similar sounding words can inadvertently be transcribed and may not be corrected upon review.

## 2015-12-11 NOTE — Patient Instructions (Signed)
1. Continue taking Protonix 2. Take Miralax once a day. Mix it in a drink you would normally drink otherwise 3. Return for follow-up in 3 months.

## 2015-12-12 ENCOUNTER — Encounter: Payer: Self-pay | Admitting: "Endocrinology

## 2015-12-12 ENCOUNTER — Ambulatory Visit (INDEPENDENT_AMBULATORY_CARE_PROVIDER_SITE_OTHER): Payer: Medicare Other | Admitting: "Endocrinology

## 2015-12-12 VITALS — BP 102/63 | HR 89 | Wt 164.0 lb

## 2015-12-12 DIAGNOSIS — E039 Hypothyroidism, unspecified: Secondary | ICD-10-CM | POA: Diagnosis not present

## 2015-12-12 DIAGNOSIS — E1159 Type 2 diabetes mellitus with other circulatory complications: Secondary | ICD-10-CM | POA: Diagnosis not present

## 2015-12-12 DIAGNOSIS — I10 Essential (primary) hypertension: Secondary | ICD-10-CM

## 2015-12-12 DIAGNOSIS — E785 Hyperlipidemia, unspecified: Secondary | ICD-10-CM

## 2015-12-12 DIAGNOSIS — I209 Angina pectoris, unspecified: Secondary | ICD-10-CM | POA: Diagnosis not present

## 2015-12-12 NOTE — Progress Notes (Signed)
Subjective:    Patient ID: Jeffrey [name], male    DOB: 03/01/1929. Patient is being seen in consultation for management of diabetes requested by  Jani Gravel, MD  Past Medical History  Diagnosis Date  . Diabetes mellitus, type II ([place])   . Hyperlipidemia   . Coronary atherosclerosis of native coronary artery     a. CABG x 4 in 1989 (VG->OM1->OM2, VG->RCA, LIMA->LAD), b. 05/2010: DES to VG-OM1/OM2, DES to distal LCx. c. NSTEMI in 04/2011 - TO distal LCX stent and VG->OM2. d. 02/2012 NSTEMI DES to VG-OM1/continuation to OM2 occluded. e. inferior STEMI s/p DES to SVG-RAMUS 06/2012. f. inferolat STEMI 09/2012 s/p DES to SVG-interm; g. Lex MV (11/14):  EF 35%, inf-lat scar with small peri-infarct ischemia  . Essential hypertension, benign   . Osteoarthritis   . History of stroke   . History of pneumonia   . Cervical vertebral fracture ([place])   . Chronic back pain   . Benign prostatic hypertrophy     History of urinary retention  . Peptic ulcer disease   . Gastroesophageal reflux disease   . Ischemic cardiomyopathy Nov 2015    EF 35% cath, 45-50% by echo  . Hypothyroidism   . Anxiety about health     "multiple somatic complaints"  . Chronic chest pain    Past Surgical History  Procedure Laterality Date  . Tonsillectomy    . Coronary artery bypass graft  1989  . Coronary angioplasty  10/12, 8/13, 12/13, 3/14    SVG-OM PCI  . Cardiac catheterization  05/19/14    SVG-OM occl- medical Rx  . Left heart catheterization with coronary angiogram N/A 11/01/2011    Procedure: LEFT HEART CATHETERIZATION WITH CORONARY ANGIOGRAM;  Surgeon: Lorretta Harp, MD;  Location: [facility] CATH LAB;  Service: Cardiovascular;  Laterality: N/A;  . Percutaneous coronary stent intervention (pci-s) N/A 11/01/2011    Procedure: PERCUTANEOUS CORONARY STENT INTERVENTION (PCI-S);  Surgeon: Lorretta Harp, MD;  Location: [facility] CATH LAB;  Service: Cardiovascular;  Laterality: N/A;  . Left heart catheterization with  coronary/graft angiogram N/A 03/10/2012    Procedure: LEFT HEART CATHETERIZATION WITH [name];  Surgeon: Sherren Mocha, MD;  Location: [facility] CATH LAB;  Service: Cardiovascular;  Laterality: N/A;  . Left heart catheterization with coronary angiogram N/A 06/18/2012    Procedure: LEFT HEART CATHETERIZATION WITH CORONARY ANGIOGRAM;  Surgeon: Peter M Martinique, MD;  Location: [facility] CATH LAB;  Service: Cardiovascular;  Laterality: N/A;  . Percutaneous coronary stent intervention (pci-s)  06/18/2012    Procedure: PERCUTANEOUS CORONARY STENT INTERVENTION (PCI-S);  Surgeon: Peter M Martinique, MD;  Location: [facility] CATH LAB;  Service: Cardiovascular;;  . Left heart catheterization with coronary/graft angiogram  10/06/2012    Procedure: LEFT HEART CATHETERIZATION WITH [name];  Surgeon: Burnell Blanks, MD;  Location: [facility] CATH LAB;  Service: Cardiovascular;;  . Percutaneous coronary stent intervention (pci-s)  10/06/2012    Procedure: PERCUTANEOUS CORONARY STENT INTERVENTION (PCI-S);  Surgeon: Burnell Blanks, MD;  Location: [facility] CATH LAB;  Service: Cardiovascular;;  . Left heart catheterization with coronary/graft angiogram N/A 11/09/2012    Procedure: LEFT HEART CATHETERIZATION WITH [name];  Surgeon: Peter M Martinique, MD;  Location: [facility] CATH LAB;  Service: Cardiovascular;  Laterality: N/A;  . Left heart catheterization with coronary/graft angiogram N/A 05/19/2014    Procedure: LEFT HEART CATHETERIZATION WITH [name];  Surgeon: Troy Sine, MD;  Location: [facility] CATH LAB;  Service: Cardiovascular;  Laterality: N/A;  . Egd with foreign  body removal  08/13/2009    Food impaction   Social History   Social History  . Marital Status: Divorced    Spouse Name: N/A  . Number of Children: N/A  . Years of Education: N/A   Occupational History  . Retired     Designer, television/film set   Social History Main Topics  . Smoking status: Former Smoker -- 2.00 packs/day for 10  years    Types: Cigarettes    Start date: 07/14/1950    Quit date: 07/14/1961  . Smokeless tobacco: Current User    Types: Chew  . Alcohol Use: No  . Drug Use: No  . Sexual Activity: No   Other Topics Concern  . Not on file   Social History Narrative   ** Merged History Encounter **...   He was raised by his uncle.  His mother deceased when   he was 37 year old with appendicitis. Father deceased from an MI at age   10.  He has 1 sister, but he does not know her health status as they   have been separated.          Outpatient Encounter Prescriptions as of 12/12/2015  Medication Sig  . acetaminophen (TYLENOL) 325 MG tablet Take 2 tablets (650 mg total) by mouth every 4 (four) hours as needed for headache or mild pain.  Marland Kitchen albuterol (PROVENTIL HFA;VENTOLIN HFA) 108 (90 BASE) MCG/ACT inhaler Inhale 2 puffs into the lungs every 6 (six) hours as needed for wheezing or shortness of breath.  . ALPRAZolam (XANAX) 0.25 MG tablet Take 0.25 mg by mouth 2 (two) times daily.  Marland Kitchen aspirin EC 81 MG tablet Take 81 mg by mouth daily.  Marland Kitchen atorvastatin (LIPITOR) 40 MG tablet Take 1 tablet (40 mg total) by mouth daily at 6 PM.  . bismuth subsalicylate (PEPTO BISMOL) 262 MG/15ML suspension Take 30 mLs by mouth every 6 (six) hours as needed for indigestion or diarrhea or loose stools.   . carvedilol (COREG) 3.125 MG tablet TAKE 1 TABLET BY MOUTH TWICE DAILY WITH MEALS.  Marland Kitchen isosorbide mononitrate (IMDUR) 30 MG 24 hr tablet Take 1.5 tablets (45 mg total) by mouth daily.  Marland Kitchen levothyroxine (SYNTHROID, LEVOTHROID) 50 MCG tablet Take 50 mcg by mouth daily before breakfast. Reported on 10/10/2015  . loperamide (IMODIUM) 2 MG capsule Take 1 capsule (2 mg total) by mouth 4 (four) times daily as needed for diarrhea or loose stools.  . metFORMIN (GLUCOPHAGE) 1000 MG tablet Take 1,000 mg by mouth 2 (two) times daily with a meal.  . Multiple Vitamin (MULTIVITAMIN WITH MINERALS) TABS Take 1 tablet by mouth daily. Reported on  10/10/2015  . NITROSTAT 0.4 MG SL tablet PLACE 1 TAB UNDER TONGUE EVERY 5 MIN IF NEEDED FOR CHEST PAIN. MAY USE 3 TIMES.NO RELIEF CALL 911.  . pantoprazole (PROTONIX) 40 MG tablet Take 40 mg by mouth daily.  Marland Kitchen PARoxetine (PAXIL) 10 MG tablet Take 10 mg by mouth daily.  . tamsulosin (FLOMAX) 0.4 MG CAPS Take 0.4 mg by mouth 2 (two) times daily.   . ticagrelor (BRILINTA) 60 MG TABS tablet Take 1 tablet (60 mg total) by mouth 2 (two) times daily.  . [DISCONTINUED] empagliflozin (JARDIANCE) 10 MG TABS tablet Take 10 mg by mouth daily before breakfast.  . [DISCONTINUED] furosemide (LASIX) 20 MG tablet Take 20 mg by mouth as needed for fluid. Reported on 10/10/2015  . [DISCONTINUED] INVOKANA 100 MG TABS tablet TAKE 1 TABLET BY MOUTH DAILY BEFORE BREAKFAST.   No  facility-administered encounter medications on file as of 12/12/2015.   ALLERGIES: No Known Allergies VACCINATION STATUS: Immunization History  Administered Date(s) Administered  . Influenza Split 05/08/2011  . Pneumococcal Conjugate-13 04/13/2012  . Tdap 05/09/2013, 03/27/2014, 12/13/2014    Diabetes He presents for his follow-up diabetic visit. He has type 2 diabetes mellitus. Onset time: He was diagnosed at approximate age of 48 years. His disease course has been improving. There are no hypoglycemic associated symptoms. Pertinent negatives for hypoglycemia include no confusion, headaches, pallor or seizures. There are no diabetic associated symptoms. Pertinent negatives for diabetes include no chest pain, no fatigue, no polydipsia, no polyphagia, no polyuria and no weakness. There are no hypoglycemic complications. Symptoms are improving. Diabetic complications include heart disease, PVD and retinopathy. Risk factors for coronary artery disease include diabetes mellitus, dyslipidemia, hypertension, male sex, sedentary lifestyle and tobacco exposure. Current diabetic treatment includes oral agent (monotherapy). Compliance with diabetes  treatment: His 82 year old elderly man who lives alone. His weight is decreasing steadily. He is following a generally unhealthy diet. When asked about meal planning, he reported none. He has not had a previous visit with a dietitian. He rarely participates in exercise. Home blood sugar record trend: He came with some letter glucose readings documented by EMS showing between 100-175, no hypoglycemic.  Hyperlipidemia This is a chronic problem. The current episode started more than 1 year ago. Exacerbating diseases include diabetes. Pertinent negatives include no chest pain, myalgias or shortness of breath. Current antihyperlipidemic treatment includes statins. Risk factors for coronary artery disease include diabetes mellitus, dyslipidemia and hypertension.  Hypertension This is a chronic problem. The current episode started more than 1 year ago. Pertinent negatives include no chest pain, headaches, neck pain, palpitations or shortness of breath. Risk factors for coronary artery disease include smoking/tobacco exposure, male gender and diabetes mellitus. Hypertensive end-organ damage includes PVD, retinopathy and a thyroid problem.  Thyroid Problem Presents for initial visit. Patient reports no constipation, diarrhea, fatigue or palpitations. Past treatments include levothyroxine. The treatment provided moderate relief. His past medical history is significant for diabetes and hyperlipidemia.    Review of Systems  Constitutional: Negative for fever, chills, fatigue and unexpected weight change.  HENT: Negative for dental problem, mouth sores and trouble swallowing.   Eyes: Negative for visual disturbance.  Respiratory: Negative for cough, choking, chest tightness, shortness of breath and wheezing.   Cardiovascular: Negative for chest pain, palpitations and leg swelling.  Gastrointestinal: Negative for nausea, vomiting, abdominal pain, diarrhea, constipation and abdominal distention.  Endocrine:  Negative for polydipsia, polyphagia and polyuria.  Genitourinary: Negative for dysuria, urgency, hematuria and flank pain.  Musculoskeletal: Negative for myalgias, back pain, gait problem and neck pain.  Skin: Negative for pallor, rash and wound.  Neurological: Negative for seizures, syncope, weakness, numbness and headaches.  Psychiatric/Behavioral: Negative.  Negative for confusion and dysphoric mood.    Objective:    BP 102/63 mmHg  Pulse 89  Wt 164 lb (74.39 kg)  Wt Readings from Last 3 Encounters:  12/12/15 164 lb (74.39 kg)  12/11/15 161 lb 9.6 oz (73.301 kg)  11/23/15 171 lb (77.565 kg)    Physical Exam  Constitutional: He is oriented to person, place, and time. He appears well-developed and well-nourished. He is cooperative. No distress.  HENT:  Head: Normocephalic and atraumatic.  Eyes: EOM are normal.  Neck: Normal range of motion. Neck supple. No tracheal deviation present. No thyromegaly present.  Cardiovascular: Normal rate, S1 normal, S2 normal and normal heart sounds.  Exam reveals no gallop.   No murmur heard. Pulses:      Dorsalis pedis pulses are 1+ on the right side, and 1+ on the left side.       Posterior tibial pulses are 1+ on the right side, and 1+ on the left side.  Pulmonary/Chest: Breath sounds normal. No respiratory distress. He has no wheezes.  Abdominal: Soft. Bowel sounds are normal. He exhibits no distension. There is no tenderness. There is no guarding and no CVA tenderness.  Musculoskeletal: He exhibits no edema.       Right shoulder: He exhibits no swelling and no deformity.  Neurological: He is alert and oriented to person, place, and time. He has normal strength and normal reflexes. No cranial nerve deficit or sensory deficit. Gait normal.  Skin: Skin is warm and dry. No rash noted. No cyanosis. Nails show no clubbing.  Psychiatric: He has a normal mood and affect. His speech is normal and behavior is normal. Judgment and thought content normal.  Cognition and memory are normal.    CMP     Component Value Date/Time   NA 138 12/05/2015 0826   NA 133* 11/23/2015 1735   K 4.7 12/05/2015 0826   CL 96 12/05/2015 0826   CO2 23 12/05/2015 0826   GLUCOSE 146* 12/05/2015 0826   GLUCOSE 166* 11/23/2015 1735   BUN 15 12/05/2015 0826   BUN 21* 11/23/2015 1735   CREATININE 0.96 12/05/2015 0826   CREATININE 0.94 06/21/2014 1501   CALCIUM 10.3* 12/05/2015 0826   PROT 6.6 09/19/2015 1629   ALBUMIN 3.9 09/19/2015 1629   AST 26 09/19/2015 1629   ALT 26 09/19/2015 1629   ALKPHOS 81 09/19/2015 1629   BILITOT 0.5 09/19/2015 1629   GFRNONAA 72 12/05/2015 0826   GFRNONAA 75 06/21/2014 1501   GFRAA 84 12/05/2015 0826   GFRAA 86 06/21/2014 1501   Diabetic Labs (most recent): Lab Results  Component Value Date   HGBA1C 8.1* 12/05/2015   HGBA1C 8.2 09/19/2015   HGBA1C 9.2* 03/02/2015     Lipid Panel ( most recent) Lipid Panel     Component Value Date/Time   CHOL 118 12/05/2015 0826   CHOL 190 05/29/2013 0410   TRIG 107 12/05/2015 0826   HDL 41 12/05/2015 0826   HDL 33* 05/29/2013 0410   CHOLHDL 5.8 05/29/2013 0410   VLDL 67* 05/29/2013 0410   LDLCALC 56 12/05/2015 0826   LDLCALC 90 05/29/2013 0410      Assessment & Plan:   1. DM type 2 causing vascular disease ([place]) - Patient has currently  Fairly controlled  type 2 DM since 82 years of age,  with most recent A1c of 8.1% Improving from 9.2%.  Recent labs reviewed, showing normal renal function.    -his  diabetes is complrecurrent coronary artery disease which required coronary artery bypass graft and stent placement on several occasions and patient remains at a high risk for more acute and chronic complications of diabetes which include CAD, CVA, CKD, retinopathy, and neuropathy. These are all discussed in detail with the patient.  - I have counseled the patient on diet management  by adopting a carbohydrate restricted/protein rich diet.  - Suggestion is made for patient to  avoid simple carbohydrates   from their diet including Cakes , Desserts, Ice Cream,  Soda (  diet and regular) , Sweet Tea , Candies,  Chips, Cookies, Artificial Sweeteners,   and "Sugar-free" Products . This will help patient to have stable blood  glucose profile and potentially avoid unintended weight gain.  - I encouraged the patient to switch to  unprocessed or minimally processed complex starch and increased protein intake (animal or plant source), fruits, and vegetables.  - Patient is advised to stick to a routine mealtimes to eat 3 meals  a day and avoid unnecessary snacks ( to snack only to correct hypoglycemia).  - The patient will be scheduled with Jearld Fenton, RDN, CDE for individualized DM education.  - I have approached patient with the following individualized plan to manage diabetes and patient agrees:   -   He is an elderly man at age 24 year old lives alone, too high risk to give insulin therapy. - I will plan to management regimen which will not put him at risk for hypoglycemia but will be sufficient to control his diabetes.   - I have  him to continue metformin  1000 g by mouth twice a day .  - He will not require  SGLT2 i for now. - He is educated on proper hydration.   -  He is not a good candidate for incretin therapy.  - Patient specific target  A1c;  LDL, HDL, Triglycerides, and  Waist Circumference were discussed in detail.  2) BP/HTN: tight controlled. I advised to d/c lasix,  Continue current medications. 3) Lipids/HPL:   continue statins. 4)  Weight/Diet: CDE Consult will be initiated , exercise, and detailed carbohydrates information provided.  5) Hypothyroidism, unspecified hypothyroidism type - His current thyroid function tests are consistent with appropriate replacement. I will continue levothyroxine 50 g by mouth every morning.  - We discussed about correct intake of levothyroxine, at fasting, with water, separated by at least 30 minutes from breakfast,  and separated by more than 4 hours from calcium, iron, multivitamins, acid reflux medications (PPIs). -Patient is made aware of the fact that thyroid hormone replacement is needed for life, dose to be adjusted by periodic monitoring of thyroid function tests.  6) Chronic Care/Health Maintenance:  -Patient  Is on Statin medications and encouraged to continue to follow up with Ophthalmology, Podiatrist at least yearly or according to recommendations, and advised to   stay away from smoking. I have recommended yearly flu vaccine and pneumonia vaccination at least every 5 years; moderate intensity exercise for up to 150 minutes weekly; and  sleep for at least 7 hours a day. - Long-term treatment plan for this patient must include consideration of placing him in a group home or a nursing home due to his age and several comorbidities making him high risk patient to live alone. I discussed this with Reyne Dumas, integrated case manager for [facility].  -  25 minutes of time was spent on the care of this patient , 50% of which was applied for counseling on diabetes complications and their preventions.  - Patient to bring meter and  blood glucose logs during their next visit.   - I advised patient to maintain close follow up with Jani Gravel, MD for primary care needs.  Follow up plan: - Return in about 3 months (around 03/13/2016) for diabetes, high blood pressure, high cholesterol, underactive thyroid, follow up with pre-visit labs.  Glade Lloyd, MD Phone: [phone]  Fax: [phone]   12/12/2015, 11:34 AM

## 2015-12-14 NOTE — Assessment & Plan Note (Signed)
Negative in improvement in symptoms with increased compliance with Protonix given social work and EMS involvement through the program to reach out to high risk readmission patients. He is only had 1 emergency department presentation since his last visit with our office. Previously was presenting to the emergency department almost on a weekly basis. Significant improvement in over utilization. Recommended continue taking Protonix as he has been. He is entering a facility which will likely promote improved and continued compliance with medication adherence. Return for follow-up in 3 months.

## 2015-12-14 NOTE — Assessment & Plan Note (Signed)
Patient with persistent episodic constipation, not currently taking MiraLAX as directed. Recommend he take MiraLAX once a day to promote regular bowel movements. Return for follow-up in 3 months. Of note, he is entering a nursing facility tomorrow and will likely have increased compliance.

## 2015-12-17 NOTE — Progress Notes (Signed)
CC'D TO PCP °

## 2016-02-06 ENCOUNTER — Ambulatory Visit: Payer: Medicare Other | Admitting: Physician Assistant

## 2016-02-20 ENCOUNTER — Ambulatory Visit (INDEPENDENT_AMBULATORY_CARE_PROVIDER_SITE_OTHER): Payer: Medicare Other | Admitting: Physician Assistant

## 2016-02-20 ENCOUNTER — Encounter: Payer: Self-pay | Admitting: Physician Assistant

## 2016-02-20 VITALS — BP 116/60 | HR 83 | Ht 70.0 in | Wt 162.0 lb

## 2016-02-20 DIAGNOSIS — I10 Essential (primary) hypertension: Secondary | ICD-10-CM

## 2016-02-20 DIAGNOSIS — I25708 Atherosclerosis of coronary artery bypass graft(s), unspecified, with other forms of angina pectoris: Secondary | ICD-10-CM | POA: Diagnosis not present

## 2016-02-20 DIAGNOSIS — I519 Heart disease, unspecified: Secondary | ICD-10-CM

## 2016-02-20 DIAGNOSIS — I209 Angina pectoris, unspecified: Secondary | ICD-10-CM

## 2016-02-20 NOTE — Patient Instructions (Signed)
Your physician wants you to follow-up in: 6 Months with Dr. Koneswaran. You will receive a reminder letter in the mail two months in advance. If you don't receive a letter, please call our office to schedule the follow-up appointment.  Your physician recommends that you continue on your current medications as directed. Please refer to the Current Medication list given to you today.  If you need a refill on your cardiac medications before your next appointment, please call your pharmacy.  Thank you for choosing [facility]!   

## 2016-02-20 NOTE — Progress Notes (Signed)
Cardiology Office Note    Date:  02/20/2016   ID:  Jeffrey [name], DOB 09/08/1928, MRN [id number]  PCP:  Jani Gravel, MD  Cardiologist: Dr. Bronson Ing  Chief Complaint  Patient presents with  . Follow-up    History of Present Illness:  Jeffrey [name] is a 82 y.o. male with history of CAD status post prior CABG. Last cath 05/2014 LVEF 35%,occluded LAD, occluded LCX, occluded RCA. Patent LIMA-LAD, occluded SVG to OM1/OM2, occluded SVG-RCA. Patient with collaterals to RCA and OM territories. He has frequent emergency room visits for chest pain. In the ER 24 times in 6 months. Last time it 11/23/15 and troponins negative. Patient also has hypertension and hyperlipidemia.  Echo 05/27/15 LVEF 45-50% and grade I diastolic dysfunction.  Last saw Dr.Koneswaran 10/2015 complaining of shortness of breath. He was unable to increase Coreg or Imdur b/c blood pressure was too low. He recommended Ranexa if he develops recurrent chest pain.  Patient has since moved to [facility] facility. He is brought here today by his daughter. He is doing much better since he is in the nursing home. He has had no further chest pain, palpitations, dyspnea, dyspnea on exertion, dizziness or presyncope. He does complain of weakness in his pre-much in a wheelchair all the time now.     Past Medical History:  Diagnosis Date  . Anxiety about health    "multiple somatic complaints"  . Benign prostatic hypertrophy    History of urinary retention  . Cervical vertebral fracture ([place])   . Chronic back pain   . Chronic chest pain   . Coronary atherosclerosis of native coronary artery    a. CABG x 4 in 1989 (VG->OM1->OM2, VG->RCA, LIMA->LAD), b. 05/2010: DES to VG-OM1/OM2, DES to distal LCx. c. NSTEMI in 04/2011 - TO distal LCX stent and VG->OM2. d. 02/2012 NSTEMI DES to VG-OM1/continuation to OM2 occluded. e. inferior STEMI s/p DES to SVG-RAMUS 06/2012. f. inferolat STEMI 09/2012 s/p DES to SVG-interm; g. Lex MV  (11/14):  EF 35%, inf-lat scar with small peri-infarct ischemia  . Diabetes mellitus, type II ([place])   . Essential hypertension, benign   . Gastroesophageal reflux disease   . History of pneumonia   . History of stroke   . Hyperlipidemia   . Hypothyroidism   . Ischemic cardiomyopathy Nov 2015   EF 35% cath, 45-50% by echo  . Osteoarthritis   . Peptic ulcer disease     Past Surgical History:  Procedure Laterality Date  . CARDIAC CATHETERIZATION  05/19/14   SVG-OM occl- medical Rx  . CORONARY ANGIOPLASTY  10/12, 8/13, 12/13, 3/14   SVG-OM PCI  . CORONARY ARTERY BYPASS GRAFT  1989  . EGD WITH FOREIGN BODY REMOVAL  08/13/2009   Food impaction  . LEFT HEART CATHETERIZATION WITH CORONARY ANGIOGRAM N/A 11/01/2011   Procedure: LEFT HEART CATHETERIZATION WITH CORONARY ANGIOGRAM;  Surgeon: Lorretta Harp, MD;  Location: [facility] CATH LAB;  Service: Cardiovascular;  Laterality: N/A;  . LEFT HEART CATHETERIZATION WITH CORONARY ANGIOGRAM N/A 06/18/2012   Procedure: LEFT HEART CATHETERIZATION WITH CORONARY ANGIOGRAM;  Surgeon: Peter M Martinique, MD;  Location: [facility] CATH LAB;  Service: Cardiovascular;  Laterality: N/A;  . LEFT HEART CATHETERIZATION WITH CORONARY/GRAFT ANGIOGRAM N/A 03/10/2012   Procedure: LEFT HEART CATHETERIZATION WITH [name];  Surgeon: Sherren Mocha, MD;  Location: [facility] CATH LAB;  Service: Cardiovascular;  Laterality: N/A;  . LEFT HEART CATHETERIZATION WITH CORONARY/GRAFT ANGIOGRAM  10/06/2012   Procedure: LEFT HEART CATHETERIZATION WITH CORONARY/GRAFT ANGIOGRAM;  Surgeon: Burnell Blanks, MD;  Location: [facility] CATH LAB;  Service: Cardiovascular;;  . LEFT HEART CATHETERIZATION WITH CORONARY/GRAFT ANGIOGRAM N/A 11/09/2012   Procedure: LEFT HEART CATHETERIZATION WITH [name];  Surgeon: Peter M Martinique, MD;  Location: [facility] CATH LAB;  Service: Cardiovascular;  Laterality: N/A;  . LEFT HEART CATHETERIZATION WITH CORONARY/GRAFT ANGIOGRAM N/A 05/19/2014   Procedure:  LEFT HEART CATHETERIZATION WITH [name];  Surgeon: Troy Sine, MD;  Location: [facility] CATH LAB;  Service: Cardiovascular;  Laterality: N/A;  . PERCUTANEOUS CORONARY STENT INTERVENTION (PCI-S) N/A 11/01/2011   Procedure: PERCUTANEOUS CORONARY STENT INTERVENTION (PCI-S);  Surgeon: Lorretta Harp, MD;  Location: [facility] CATH LAB;  Service: Cardiovascular;  Laterality: N/A;  . PERCUTANEOUS CORONARY STENT INTERVENTION (PCI-S)  06/18/2012   Procedure: PERCUTANEOUS CORONARY STENT INTERVENTION (PCI-S);  Surgeon: Peter M Martinique, MD;  Location: [facility] CATH LAB;  Service: Cardiovascular;;  . PERCUTANEOUS CORONARY STENT INTERVENTION (PCI-S)  10/06/2012   Procedure: PERCUTANEOUS CORONARY STENT INTERVENTION (PCI-S);  Surgeon: Burnell Blanks, MD;  Location: [facility] CATH LAB;  Service: Cardiovascular;;  . TONSILLECTOMY      Current Medications: Outpatient Medications Prior to Visit  Medication Sig Dispense Refill  . acetaminophen (TYLENOL) 325 MG tablet Take 2 tablets (650 mg total) by mouth every 4 (four) hours as needed for headache or mild pain.    Marland Kitchen albuterol (PROVENTIL HFA;VENTOLIN HFA) 108 (90 BASE) MCG/ACT inhaler Inhale 2 puffs into the lungs every 6 (six) hours as needed for wheezing or shortness of breath.    . ALPRAZolam (XANAX) 0.25 MG tablet Take 0.25 mg by mouth 2 (two) times daily.    Marland Kitchen aspirin EC 81 MG tablet Take 81 mg by mouth daily.    Marland Kitchen atorvastatin (LIPITOR) 40 MG tablet Take 1 tablet (40 mg total) by mouth daily at 6 PM. 30 tablet 11  . carvedilol (COREG) 3.125 MG tablet TAKE 1 TABLET BY MOUTH TWICE DAILY WITH MEALS. 60 tablet 6  . levothyroxine (SYNTHROID, LEVOTHROID) 50 MCG tablet Take 50 mcg by mouth daily before breakfast. Reported on 10/10/2015    . metFORMIN (GLUCOPHAGE) 1000 MG tablet Take 1,000 mg by mouth 2 (two) times daily with a meal.    . Multiple Vitamin (MULTIVITAMIN WITH MINERALS) TABS Take 1 tablet by mouth daily. Reported on 10/10/2015    . NITROSTAT 0.4 MG SL  tablet PLACE 1 TAB UNDER TONGUE EVERY 5 MIN IF NEEDED FOR CHEST PAIN. MAY USE 3 TIMES.NO RELIEF CALL 911. 25 tablet 3  . pantoprazole (PROTONIX) 40 MG tablet Take 40 mg by mouth daily.    Marland Kitchen PARoxetine (PAXIL) 10 MG tablet Take 10 mg by mouth daily.    . tamsulosin (FLOMAX) 0.4 MG CAPS Take 0.4 mg by mouth 2 (two) times daily.     . ticagrelor (BRILINTA) 60 MG TABS tablet Take 1 tablet (60 mg total) by mouth 2 (two) times daily. 60 tablet 3  . bismuth subsalicylate (PEPTO BISMOL) 262 MG/15ML suspension Take 30 mLs by mouth every 6 (six) hours as needed for indigestion or diarrhea or loose stools.     . isosorbide mononitrate (IMDUR) 30 MG 24 hr tablet Take 1.5 tablets (45 mg total) by mouth daily. 45 tablet 11  . loperamide (IMODIUM) 2 MG capsule Take 1 capsule (2 mg total) by mouth 4 (four) times daily as needed for diarrhea or loose stools. 12 capsule 0   No facility-administered medications prior to visit.      Allergies:   Review of patient's allergies  indicates no known allergies.   Social History   Social History  . Marital status: Divorced    Spouse name: N/A  . Number of children: N/A  . Years of education: N/A   Occupational History  . Retired     Designer, television/film set   Social History Main Topics  . Smoking status: Former Smoker    Packs/day: 2.00    Years: 10.00    Types: Cigarettes    Start date: 07/14/1950    Quit date: 07/14/1961  . Smokeless tobacco: Former Systems developer    Types: Chew    Quit date: 12/21/2015  . Alcohol use No  . Drug use: No  . Sexual activity: No   Other Topics Concern  . None   Social History Narrative   ** Merged History Encounter **...   He was raised by his uncle.  His mother deceased when   he was 70 year old with appendicitis. Father deceased from an MI at age   51.  He has 1 sister, but he does not know her health status as they   have been separated.            Family History:  The patient's   family history includes Appendicitis in his mother; Heart  attack (age of onset: 31) in his father.   ROS:   Please see the history of present illness.    Review of Systems  Constitution: Positive for weakness and malaise/fatigue.  HENT: Negative.   Cardiovascular: Negative.   Respiratory: Negative.   Endocrine: Negative.   Hematologic/Lymphatic: Negative.   Musculoskeletal: Positive for arthritis and muscle weakness.  Gastrointestinal: Negative.   Genitourinary: Negative.    All other systems reviewed and are negative.   PHYSICAL EXAM:   VS:  BP 116/60   Pulse 83   Ht 5\' 10"  (1.778 m)   Wt 162 lb (73.5 kg)   SpO2 97%   BMI 23.24 kg/m   Physical Exam  GEN: Well nourished, well developed, in no acute distress   Neck: no JVD, carotid bruits, or masses Cardiac:RRR;Positive S4 and 1/6 systolic murmur at the left sternal border no reason or rubs  Respiratory:  clear to auscultation bilaterally, normal work of breathing GI: soft, nontender, nondistended, + BS Ext: without cyanosis, clubbing, or edema, Good distal pulses bilaterally MS: no deformity or atrophy  Skin: warm and dry, no rash Neuro:  Alert and Oriented x 3, Strength and sensation are intact Psych: euthymic mood, full affect  Wt Readings from Last 3 Encounters:  02/20/16 162 lb (73.5 kg)  12/12/15 164 lb (74.4 kg)  12/11/15 161 lb 9.6 oz (73.3 kg)      Studies/Labs Reviewed:   EKG:  EKG is not ordered today.    Recent Labs: 05/02/2015: B Natriuretic Peptide 217.0 09/19/2015: ALT 26 11/23/2015: Hemoglobin 13.2; Platelets 161 12/05/2015: BUN 15; Creatinine, Ser 0.96; Potassium 4.7; Sodium 138; TSH 2.710   Lipid Panel    Component Value Date/Time   CHOL 118 12/05/2015 0826   TRIG 107 12/05/2015 0826   HDL 41 12/05/2015 0826   CHOLHDL 5.8 05/29/2013 0410   VLDL 67 (H) 05/29/2013 0410   LDLCALC 56 12/05/2015 0826    Additional studies/ records that were reviewed today include:   2-D echo 05/2015 Study Conclusions   - Left ventricle: The cavity size was mildly  dilated. Wall   thickness was normal. Systolic function was mildly reduced. The   estimated ejection fraction was in the range of 45% to  50%.   Doppler parameters are consistent with abnormal left ventricular   relaxation (grade 1 diastolic dysfunction). - Left atrium: The atrium was moderately dilated. - Right ventricle: Systolic function was mildly reduced.    ASSESSMENT:    1. Coronary artery disease involving coronary bypass graft of native heart with other forms of angina pectoris ([place])   2. Essential hypertension   3. Left ventricular dysfunction      PLAN:  In order of problems listed above:  CAD stable without angina. Patient had had frequent emergency room visits for chest pain. Last one was in May 2017 and troponins were negative. He has since moved to a nursing home and is doing much better surrounded by other people and getting regular meals. Continue current medications. Follow-up with Dr. Bronson Ing in 6 months.  Essential hypertension controlled   LV dysfunction EF 45-50% on echo in 05/2015. No evidence of heart failure      Medication Adjustments/Labs and Tests Ordered: Current medicines are reviewed at length with the patient today.  Concerns regarding medicines are outlined above.  Medication changes, Labs and Tests ordered today are listed in the Patient Instructions below. Patient Instructions  Your physician wants you to follow-up in: 6 Months with Dr. Bronson Ing.  You will receive a reminder letter in the mail two months in advance. If you don't receive a letter, please call our office to schedule the follow-up appointment.  Your physician recommends that you continue on your current medications as directed. Please refer to the Current Medication list given to you today.  If you need a refill on your cardiac medications before your next appointment, please call your pharmacy.  Thank you for choosing [facility]!      Sumner Boast,  PA-C  02/20/2016 12:13 PM    [facility] [address], [place], [place]  [address] Phone: [phone]; Fax: [phone]

## 2016-03-05 ENCOUNTER — Ambulatory Visit: Payer: Medicare Other | Admitting: Nurse Practitioner

## 2016-03-10 IMAGING — CR DG CHEST 1V PORT
1 series · 1 of 1 positions shown · non-contrast
Comparison: March 27, 2015

CLINICAL DATA: Hypertension. One day history of right upper
extremity region pain

EXAM:
PORTABLE CHEST - 1 VIEW

[ap portable]
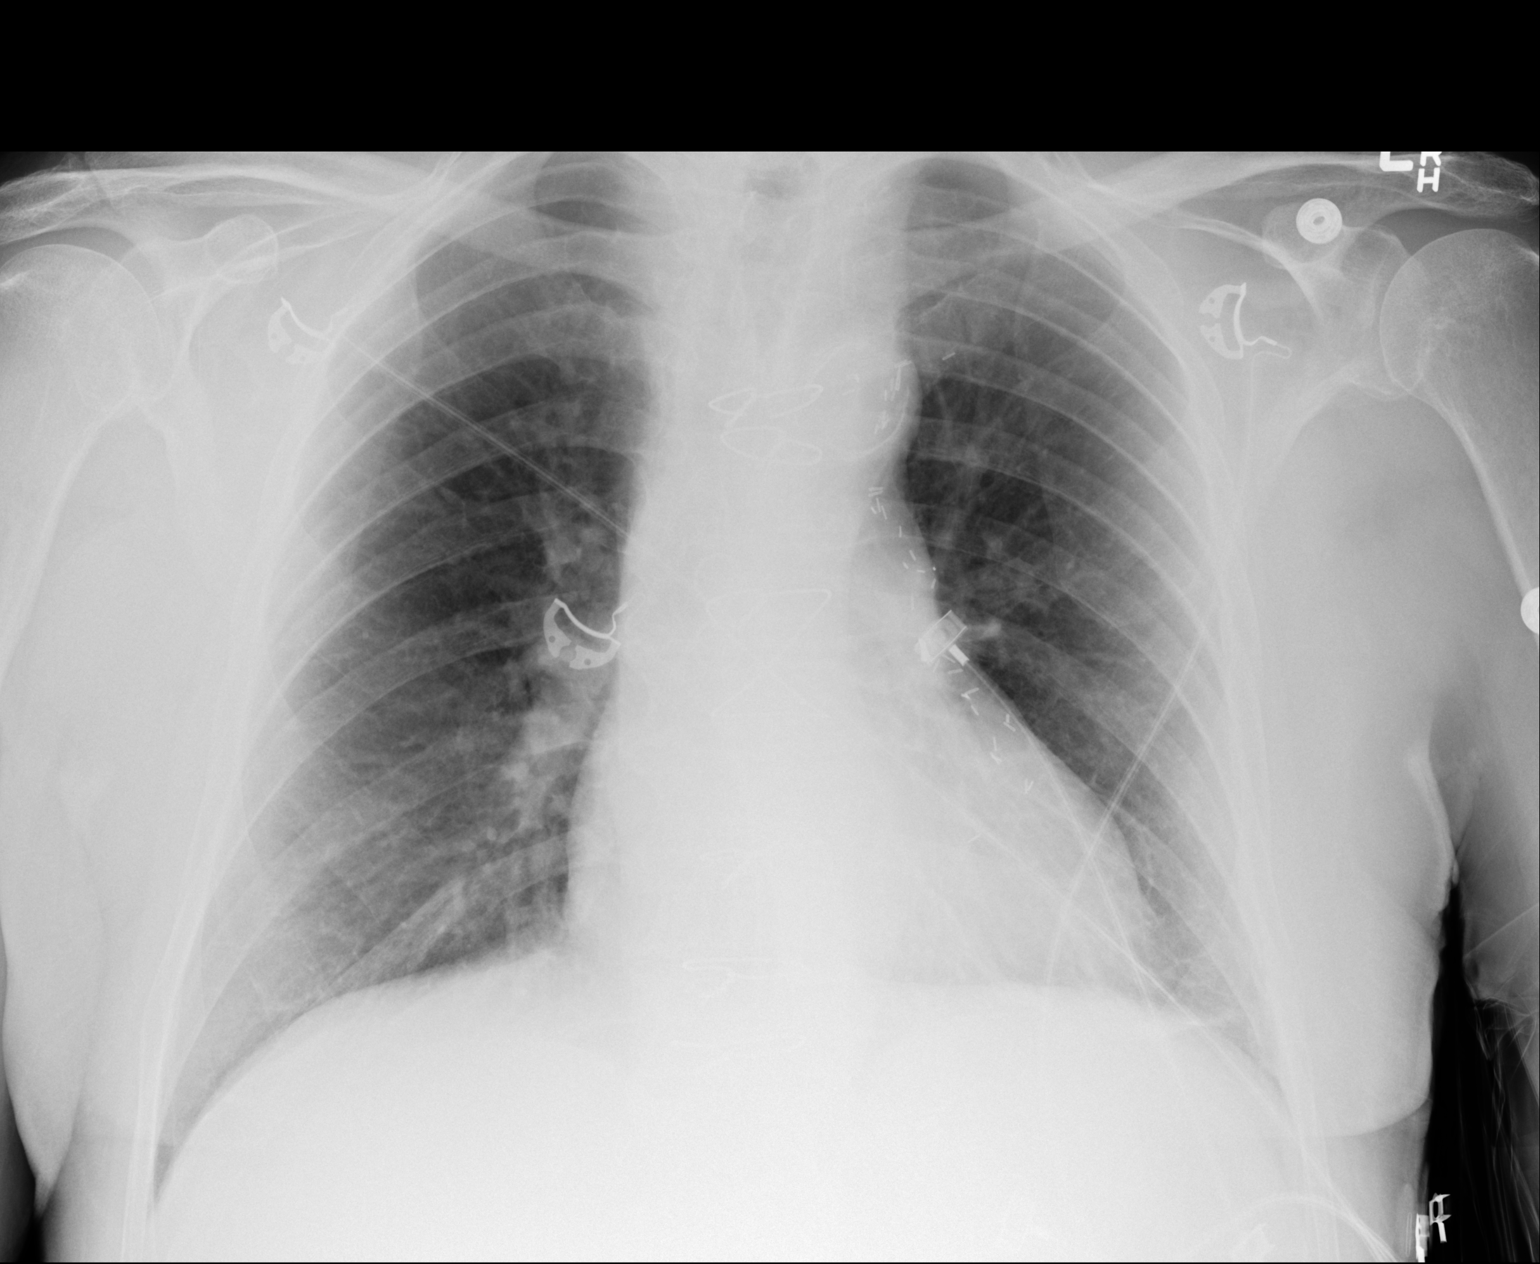

[1 of 1 positions shown; findings below may reference images not displayed]

FINDINGS: There is slight atelectasis in the left base. Lungs elsewhere clear.
Heart is upper normal in size with pulmonary vascularity within
normal limits. No adenopathy. Patient is status post internal
mammary bypass grafting. No bone lesions. No pneumothorax.
IMPRESSION: Slight atelectasis left base. Lungs elsewhere clear. No change in
cardiac silhouette.

## 2016-03-27 ENCOUNTER — Ambulatory Visit: Payer: Medicare Other | Admitting: "Endocrinology

## 2016-04-01 ENCOUNTER — Encounter: Payer: Self-pay | Admitting: Nurse Practitioner

## 2016-04-01 ENCOUNTER — Ambulatory Visit (INDEPENDENT_AMBULATORY_CARE_PROVIDER_SITE_OTHER): Payer: Medicare Other | Admitting: Nurse Practitioner

## 2016-04-01 VITALS — BP 93/41 | HR 70 | Temp 98.2°F | Ht 70.0 in | Wt 160.0 lb

## 2016-04-01 DIAGNOSIS — K219 Gastro-esophageal reflux disease without esophagitis: Secondary | ICD-10-CM | POA: Diagnosis not present

## 2016-04-01 DIAGNOSIS — K5900 Constipation, unspecified: Secondary | ICD-10-CM

## 2016-04-01 DIAGNOSIS — I209 Angina pectoris, unspecified: Secondary | ICD-10-CM

## 2016-04-01 NOTE — Assessment & Plan Note (Signed)
Continued significant improvement while on regular PPI. Recommended he continue Protonix 40 mg daily. Return for follow-up in one year, or sooner if needed for any worsening or recurrent symptoms.

## 2016-04-01 NOTE — Assessment & Plan Note (Signed)
Constipation much improved on MiraLAX daily. Occasional breakthrough diarrhea for which the facility has standing orders for Imodium. Recommend continue MiraLAX, treat Imodium as needed. Persistent loose stools can test for pathogens. Return for follow-up in one year, or sooner if needed.

## 2016-04-01 NOTE — Patient Instructions (Signed)
1. Keep taking Protonix 40 mg daily. 2. Keep taking MiraLAX daily. 3. Notify the facility if you have diarrhea. 4. Return for follow-up in one year, or sooner if needed.

## 2016-04-01 NOTE — Progress Notes (Signed)
CC'ED TO PCP 

## 2016-04-01 NOTE — Progress Notes (Signed)
Referring Provider: Jani Gravel, MD Primary Care Physician:  Jani Gravel, MD Primary GI:  Dr. Oneida Alar  Chief Complaint  Patient presents with  . Gastroesophageal Reflux    HPI:   Jeffrey [name] is a 82 y.o. male who presents for follow-up on GERD. The patient was last seen in our office 12/11/2015 for the same. At that time he was accompanied by EMS and social work through the home visit program who indicated compliance is much improved. Symptoms much better controlled, occasional breakthrough related to dietary indiscretion. Recommend continuing Protonix and return for follow-up in 3 months. Related to constipation it was noted he is not taking MiraLAX as directed and recommended that he do so. Return for follow-up in 3 months.  He resides at [facility] currently. Today he states his GERD continues improved. Bowel movements are improved with regular Miralax and increased fiber. Occasional diarrhea, but not often. Lasts 2-3 times and will go to the nursing station for symptomatic relief. Denies hematochezia, melena, abdominal pain, N/V. Denies chest pain, dyspnea, dizziness, lightheadedness, syncope, near syncope. Denies any other upper or lower GI symptoms.  Past Medical History:  Diagnosis Date  . Anxiety about health    "multiple somatic complaints"  . Benign prostatic hypertrophy    History of urinary retention  . Cervical vertebral fracture ([place])   . Chronic back pain   . Chronic chest pain   . Coronary atherosclerosis of native coronary artery    a. CABG x 4 in 1989 (VG->OM1->OM2, VG->RCA, LIMA->LAD), b. 05/2010: DES to VG-OM1/OM2, DES to distal LCx. c. NSTEMI in 04/2011 - TO distal LCX stent and VG->OM2. d. 02/2012 NSTEMI DES to VG-OM1/continuation to OM2 occluded. e. inferior STEMI s/p DES to SVG-RAMUS 06/2012. f. inferolat STEMI 09/2012 s/p DES to SVG-interm; g. Lex MV (11/14):  EF 35%, inf-lat scar with small peri-infarct ischemia  . Diabetes mellitus, type II ([place])   . Essential  hypertension, benign   . Gastroesophageal reflux disease   . History of pneumonia   . History of stroke   . Hyperlipidemia   . Hypothyroidism   . Ischemic cardiomyopathy Nov 2015   EF 35% cath, 45-50% by echo  . Osteoarthritis   . Peptic ulcer disease     Past Surgical History:  Procedure Laterality Date  . CARDIAC CATHETERIZATION  05/19/14   SVG-OM occl- medical Rx  . CORONARY ANGIOPLASTY  10/12, 8/13, 12/13, 3/14   SVG-OM PCI  . CORONARY ARTERY BYPASS GRAFT  1989  . EGD WITH FOREIGN BODY REMOVAL  08/13/2009   Food impaction  . LEFT HEART CATHETERIZATION WITH CORONARY ANGIOGRAM N/A 11/01/2011   Procedure: LEFT HEART CATHETERIZATION WITH CORONARY ANGIOGRAM;  Surgeon: Lorretta Harp, MD;  Location: [facility] CATH LAB;  Service: Cardiovascular;  Laterality: N/A;  . LEFT HEART CATHETERIZATION WITH CORONARY ANGIOGRAM N/A 06/18/2012   Procedure: LEFT HEART CATHETERIZATION WITH CORONARY ANGIOGRAM;  Surgeon: Peter M Martinique, MD;  Location: [facility] CATH LAB;  Service: Cardiovascular;  Laterality: N/A;  . LEFT HEART CATHETERIZATION WITH CORONARY/GRAFT ANGIOGRAM N/A 03/10/2012   Procedure: LEFT HEART CATHETERIZATION WITH [name];  Surgeon: Sherren Mocha, MD;  Location: [facility] CATH LAB;  Service: Cardiovascular;  Laterality: N/A;  . LEFT HEART CATHETERIZATION WITH CORONARY/GRAFT ANGIOGRAM  10/06/2012   Procedure: LEFT HEART CATHETERIZATION WITH [name];  Surgeon: Burnell Blanks, MD;  Location: [facility] CATH LAB;  Service: Cardiovascular;;  . LEFT HEART CATHETERIZATION WITH CORONARY/GRAFT ANGIOGRAM N/A 11/09/2012   Procedure: LEFT HEART CATHETERIZATION WITH CORONARY/GRAFT ANGIOGRAM;  Surgeon: Peter M Martinique, MD;  Location: [facility] CATH LAB;  Service: Cardiovascular;  Laterality: N/A;  . LEFT HEART CATHETERIZATION WITH CORONARY/GRAFT ANGIOGRAM N/A 05/19/2014   Procedure: LEFT HEART CATHETERIZATION WITH [name];  Surgeon: Troy Sine, MD;  Location: [facility] CATH LAB;   Service: Cardiovascular;  Laterality: N/A;  . PERCUTANEOUS CORONARY STENT INTERVENTION (PCI-S) N/A 11/01/2011   Procedure: PERCUTANEOUS CORONARY STENT INTERVENTION (PCI-S);  Surgeon: Lorretta Harp, MD;  Location: [facility] CATH LAB;  Service: Cardiovascular;  Laterality: N/A;  . PERCUTANEOUS CORONARY STENT INTERVENTION (PCI-S)  06/18/2012   Procedure: PERCUTANEOUS CORONARY STENT INTERVENTION (PCI-S);  Surgeon: Peter M Martinique, MD;  Location: [facility] CATH LAB;  Service: Cardiovascular;;  . PERCUTANEOUS CORONARY STENT INTERVENTION (PCI-S)  10/06/2012   Procedure: PERCUTANEOUS CORONARY STENT INTERVENTION (PCI-S);  Surgeon: Burnell Blanks, MD;  Location: [facility] CATH LAB;  Service: Cardiovascular;;  . TONSILLECTOMY      Current Outpatient Prescriptions  Medication Sig Dispense Refill  . acetaminophen (TYLENOL) 325 MG tablet Take 2 tablets (650 mg total) by mouth every 4 (four) hours as needed for headache or mild pain.    Marland Kitchen albuterol (PROVENTIL HFA;VENTOLIN HFA) 108 (90 BASE) MCG/ACT inhaler Inhale 2 puffs into the lungs every 6 (six) hours as needed for wheezing or shortness of breath.    . ALPRAZolam (XANAX) 0.25 MG tablet Take 0.25 mg by mouth 2 (two) times daily.    Marland Kitchen aspirin EC 81 MG tablet Take 81 mg by mouth daily.    Marland Kitchen atorvastatin (LIPITOR) 40 MG tablet Take 1 tablet (40 mg total) by mouth daily at 6 PM. 30 tablet 11  . BRILINTA 90 MG TABS tablet Take 90 mg by mouth 2 (two) times daily.     . carvedilol (COREG) 3.125 MG tablet TAKE 1 TABLET BY MOUTH TWICE DAILY WITH MEALS. 60 tablet 6  . levothyroxine (SYNTHROID, LEVOTHROID) 50 MCG tablet Take 50 mcg by mouth daily before breakfast. Reported on 10/10/2015    . metFORMIN (GLUCOPHAGE) 1000 MG tablet Take 1,000 mg by mouth 2 (two) times daily with a meal.    . Multiple Vitamin (MULTIVITAMIN WITH MINERALS) TABS Take 1 tablet by mouth daily. Reported on 10/10/2015    . NITROSTAT 0.4 MG SL tablet PLACE 1 TAB UNDER TONGUE EVERY 5 MIN IF NEEDED FOR CHEST  PAIN. MAY USE 3 TIMES.NO RELIEF CALL 911. 25 tablet 3  . NOVOLOG FLEXPEN 100 UNIT/ML FlexPen Inject 100 Units into the skin every morning.     . pantoprazole (PROTONIX) 40 MG tablet Take 40 mg by mouth daily.    Marland Kitchen PARoxetine (PAXIL) 10 MG tablet Take 10 mg by mouth daily.    . polyethylene glycol powder (GLYCOLAX/MIRALAX) powder Take 1 Container by mouth once.    . tamsulosin (FLOMAX) 0.4 MG CAPS Take 0.4 mg by mouth 2 (two) times daily.      No current facility-administered medications for this visit.     Allergies as of 04/01/2016  . (No Known Allergies)    Family History  Problem Relation Age of Onset  . Appendicitis Mother     Pt was 66 year old  . Heart attack Father 102  . Early death      Parents died young  . Colon cancer Neg Hx     Social History   Social History  . Marital status: Divorced    Spouse name: N/A  . Number of children: N/A  . Years of education: N/A   Occupational History  .  Retired     Designer, television/film set   Social History Main Topics  . Smoking status: Former Smoker    Packs/day: 2.00    Years: 10.00    Types: Cigarettes    Start date: 07/14/1950    Quit date: 07/14/1961  . Smokeless tobacco: Former Systems developer    Types: Chew    Quit date: 12/21/2015  . Alcohol use No  . Drug use: No  . Sexual activity: No   Other Topics Concern  . None   Social History Narrative   ** Merged History Encounter **...   He was raised by his uncle.  His mother deceased when   he was 66 year old with appendicitis. Father deceased from an MI at age   41.  He has 1 sister, but he does not know her health status as they   have been separated.           Review of Systems: 10-point ROS negative except as per HPI.   Physical Exam: BP (!) 93/41   Pulse 70   Temp 98.2 F (36.8 C) (Oral)   Ht 5\' 10"  (1.778 m)   Wt 160 lb (72.6 kg)   BMI 22.96 kg/m  General:   Alert and oriented. Pleasant and cooperative. Well-nourished and well-developed. Sitting in wheelchair. Head:   Normocephalic and atraumatic. Eyes:  Without icterus, sclera clear and conjunctiva pink.  Ears:  Normal auditory acuity. Cardiovascular:  S1, S2 present without murmurs appreciated. Extremities without clubbing or edema. Respiratory:  Clear to auscultation bilaterally. No wheezes, rales, or rhonchi. No distress.  Gastrointestinal:  +BS, soft, non-tender and non-distended. No HSM noted. No guarding or rebound. No masses appreciated.  Rectal:  Deferred  Musculoskalatal:  Symmetrical without gross deformities. Neurologic:  Alert and oriented x4;  grossly normal neurologically. Psych:  Alert and cooperative. Normal mood and affect. Heme/Lymph/Immune: No excessive bruising noted.    04/01/2016 11:04 AM   Disclaimer: This note was dictated with voice recognition software. Similar sounding words can inadvertently be transcribed and may not be corrected upon review.

## 2016-05-06 IMAGING — DX DG CHEST 2V
2 series · 2 of 2 positions shown · non-contrast
Comparison: PA and lateral chest 05/06/2015. Single view of the
chest 06/01/2014.

CLINICAL DATA: Chest pain beginning today. No known injury. Initial
encounter.

EXAM:
CHEST  2 VIEW

[chest pa]
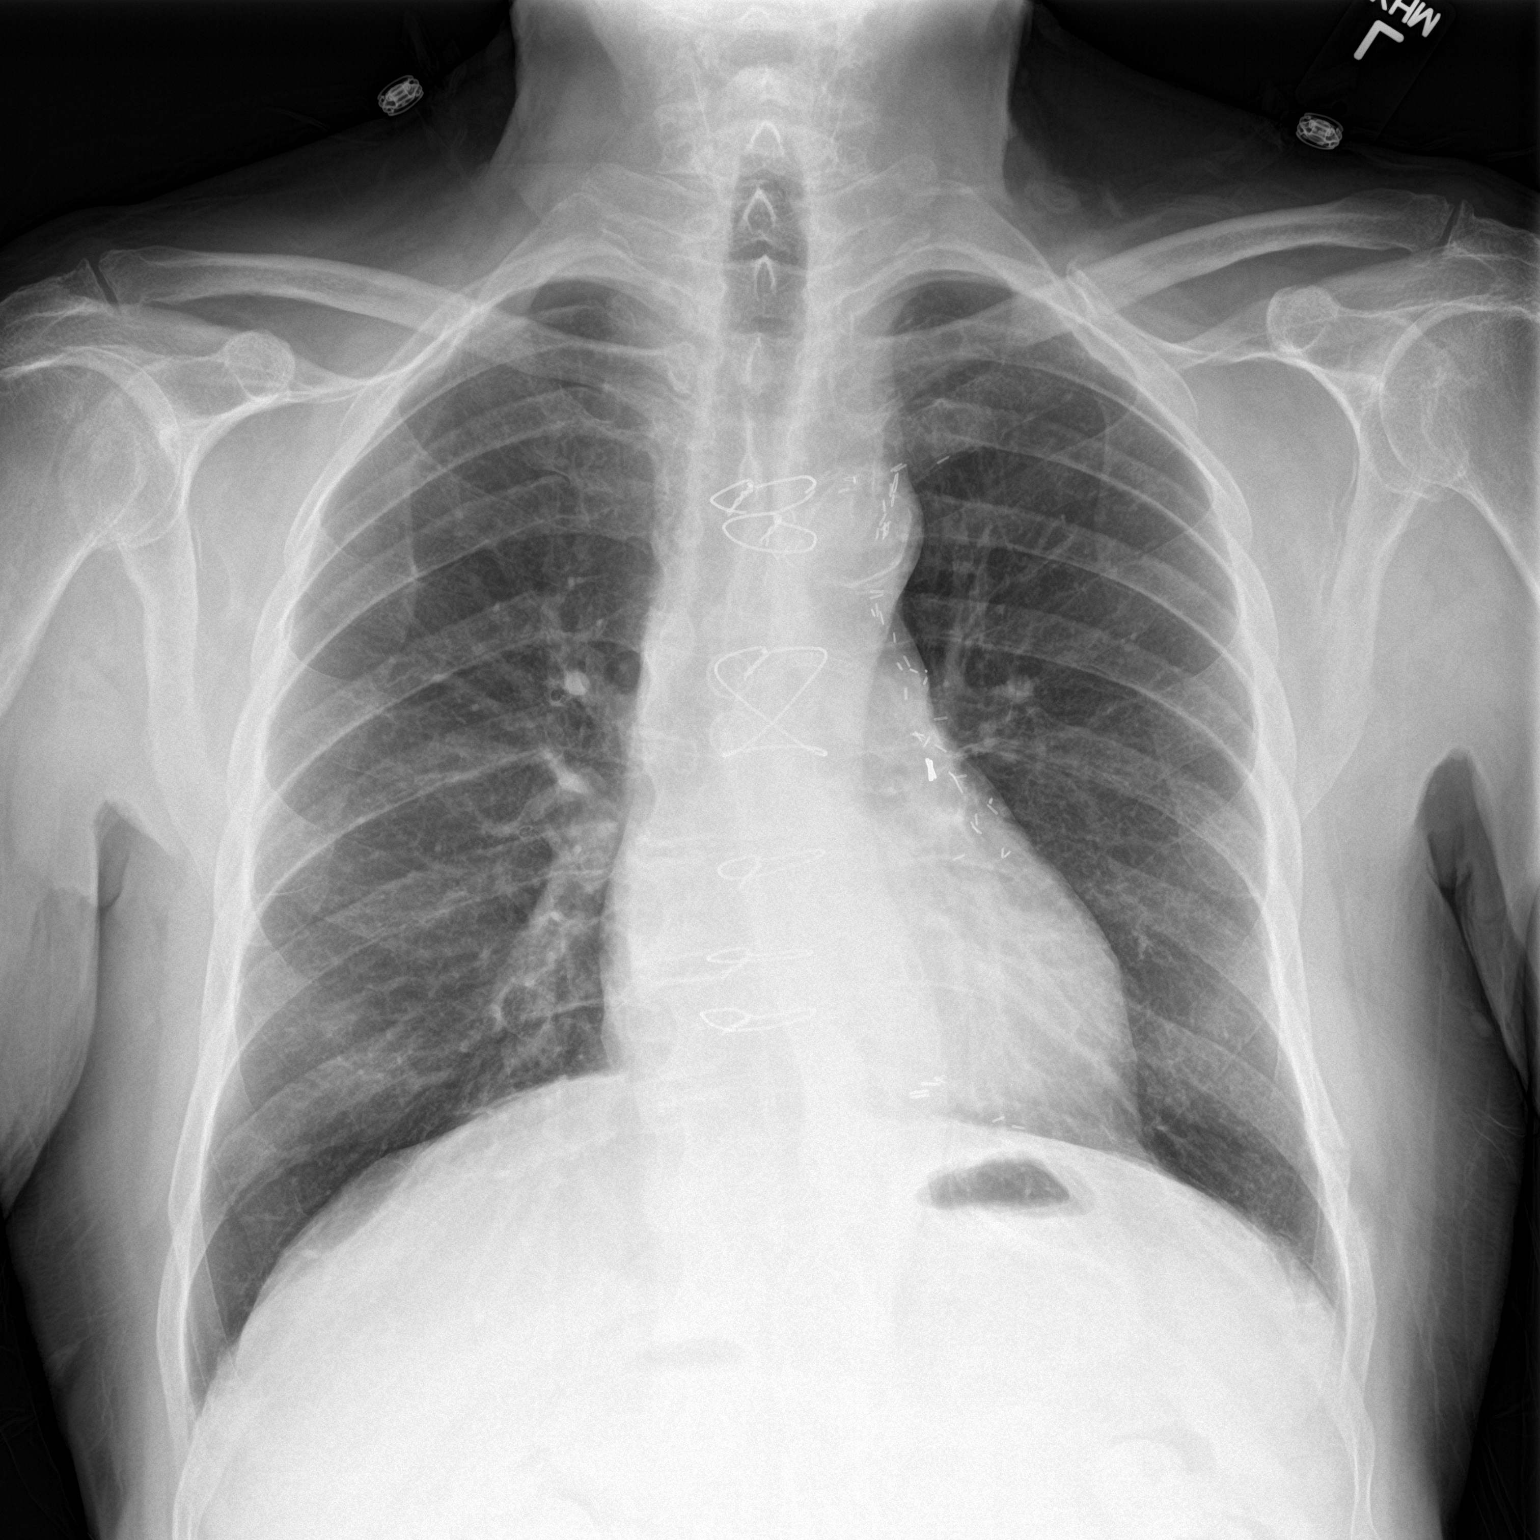

[chest lat]
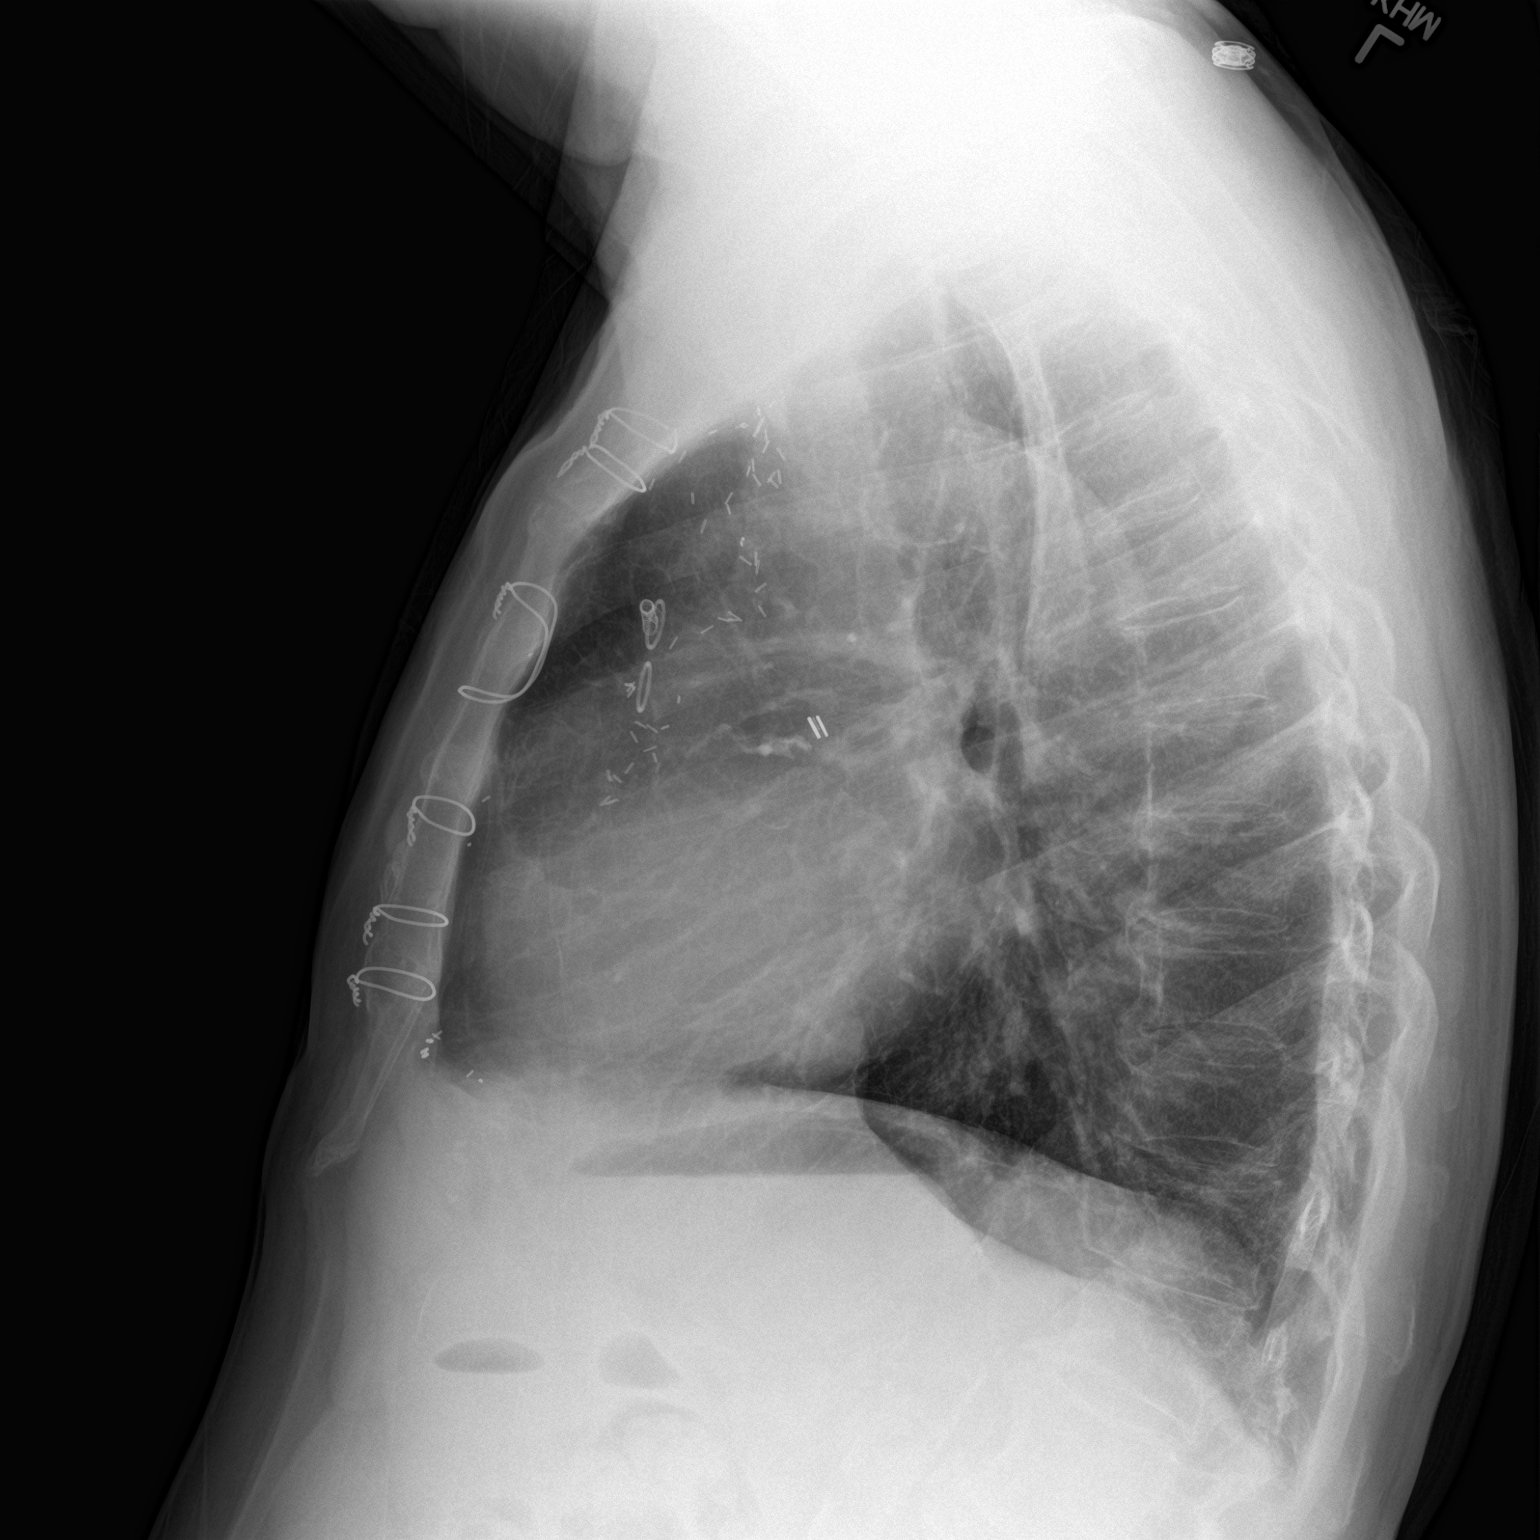

[2 of 2 positions shown; findings below may reference images not displayed]

FINDINGS: The patient is status post CABG. The lungs are clear. Heart size is
normal. No pneumothorax or pleural effusion. Aortic atherosclerosis
is noted.
IMPRESSION: No acute disease.

Atherosclerosis.

## 2016-08-18 ENCOUNTER — Encounter: Payer: Self-pay | Admitting: Cardiovascular Disease

## 2016-08-18 ENCOUNTER — Ambulatory Visit (INDEPENDENT_AMBULATORY_CARE_PROVIDER_SITE_OTHER): Payer: Medicare Other | Admitting: Cardiovascular Disease

## 2016-08-18 VITALS — BP 114/58 | HR 69 | Ht 70.0 in | Wt 167.0 lb

## 2016-08-18 DIAGNOSIS — I25708 Atherosclerosis of coronary artery bypass graft(s), unspecified, with other forms of angina pectoris: Secondary | ICD-10-CM | POA: Diagnosis not present

## 2016-08-18 DIAGNOSIS — I519 Heart disease, unspecified: Secondary | ICD-10-CM

## 2016-08-18 DIAGNOSIS — I209 Angina pectoris, unspecified: Secondary | ICD-10-CM | POA: Diagnosis not present

## 2016-08-18 DIAGNOSIS — E7800 Pure hypercholesterolemia, unspecified: Secondary | ICD-10-CM | POA: Diagnosis not present

## 2016-08-18 DIAGNOSIS — I10 Essential (primary) hypertension: Secondary | ICD-10-CM | POA: Diagnosis not present

## 2016-08-18 MED ORDER — TICAGRELOR 60 MG PO TABS: 60.0000 mg | ORAL_TABLET | Freq: Two times a day (BID) | ORAL | 6 refills | Status: AC

## 2016-08-18 NOTE — Patient Instructions (Signed)
Your physician wants you to follow-up in: 6 months Dr Koneswaran You will receive a reminder letter in the mail two months in advance. If you don't receive a letter, please call our office to schedule the follow-up appointment.     DECREASE Brilinta to 60 mg twice a day         Thank you for choosing [facility] !         

## 2016-08-18 NOTE — Progress Notes (Signed)
SUBJECTIVE: The patient presents for follow up of CAD and CABG.   05/2014 cath: LVEF 35%, occluded LAD, occluded LCX, occluded RCA. Patent LIMA-LAD, occluded SVG to OM1/OM2, occluded SVG-RCA. Patient with collaterals to RCA and OM territories.   Echo 05/27/15 LVEF 45-50% and grade I diastolic dysfunction.  He now resides in a nursing home.  He denies chest pain. Prior to stent placement he had complaints of "gas-like pains ". He does complain of generalized weakness and fatigue.  Nuclear stress testing in November 2014 showed an inferolateral defect with a small amount of peri-infarct ischemia. He does not wish to undergo stress testing again.   Review of Systems: As per "subjective", otherwise negative.  No Known Allergies  Current Outpatient Prescriptions  Medication Sig Dispense Refill  . acetaminophen (TYLENOL) 325 MG tablet Take 2 tablets (650 mg total) by mouth every 4 (four) hours as needed for headache or mild pain.    Marland Kitchen albuterol (PROVENTIL HFA;VENTOLIN HFA) 108 (90 BASE) MCG/ACT inhaler Inhale 2 puffs into the lungs every 6 (six) hours as needed for wheezing or shortness of breath.    . ALPRAZolam (XANAX) 0.25 MG tablet Take 0.25 mg by mouth 2 (two) times daily.    Marland Kitchen aspirin EC 81 MG tablet Take 81 mg by mouth daily.    Marland Kitchen atorvastatin (LIPITOR) 40 MG tablet Take 1 tablet (40 mg total) by mouth daily at 6 PM. 30 tablet 11  . BRILINTA 90 MG TABS tablet Take 90 mg by mouth 2 (two) times daily.     . carvedilol (COREG) 3.125 MG tablet TAKE 1 TABLET BY MOUTH TWICE DAILY WITH MEALS. 60 tablet 6  . levothyroxine (SYNTHROID, LEVOTHROID) 50 MCG tablet Take 50 mcg by mouth daily before breakfast. Reported on 10/10/2015    . metFORMIN (GLUCOPHAGE) 1000 MG tablet Take 1,000 mg by mouth 2 (two) times daily with a meal.    . Multiple Vitamin (MULTIVITAMIN WITH MINERALS) TABS Take 1 tablet by mouth daily. Reported on 10/10/2015    . NITROSTAT 0.4 MG SL tablet PLACE 1 TAB UNDER TONGUE  EVERY 5 MIN IF NEEDED FOR CHEST PAIN. MAY USE 3 TIMES.NO RELIEF CALL 911. 25 tablet 3  . NOVOLOG FLEXPEN 100 UNIT/ML FlexPen Inject 100 Units into the skin every morning.     . pantoprazole (PROTONIX) 40 MG tablet Take 40 mg by mouth daily.    Marland Kitchen PARoxetine (PAXIL) 10 MG tablet Take 10 mg by mouth daily.    . polyethylene glycol powder (GLYCOLAX/MIRALAX) powder Take 1 Container by mouth once.    . tamsulosin (FLOMAX) 0.4 MG CAPS Take 0.4 mg by mouth 2 (two) times daily.      No current facility-administered medications for this visit.     Past Medical History:  Diagnosis Date  . Anxiety about health    "multiple somatic complaints"  . Benign prostatic hypertrophy    History of urinary retention  . Cervical vertebral fracture ([place])   . Chronic back pain   . Chronic chest pain   . Coronary atherosclerosis of native coronary artery    a. CABG x 4 in 1989 (VG->OM1->OM2, VG->RCA, LIMA->LAD), b. 05/2010: DES to VG-OM1/OM2, DES to distal LCx. c. NSTEMI in 04/2011 - TO distal LCX stent and VG->OM2. d. 02/2012 NSTEMI DES to VG-OM1/continuation to OM2 occluded. e. inferior STEMI s/p DES to SVG-RAMUS 06/2012. f. inferolat STEMI 09/2012 s/p DES to SVG-interm; g. Lex MV (11/14):  EF 35%, inf-lat scar with small peri-infarct  ischemia  . Diabetes mellitus, type II ([place])   . Essential hypertension, benign   . Gastroesophageal reflux disease   . History of pneumonia   . History of stroke   . Hyperlipidemia   . Hypothyroidism   . Ischemic cardiomyopathy Nov 2015   EF 35% cath, 45-50% by echo  . Osteoarthritis   . Peptic ulcer disease     Past Surgical History:  Procedure Laterality Date  . CARDIAC CATHETERIZATION  05/19/14   SVG-OM occl- medical Rx  . CORONARY ANGIOPLASTY  10/12, 8/13, 12/13, 3/14   SVG-OM PCI  . CORONARY ARTERY BYPASS GRAFT  1989  . EGD WITH FOREIGN BODY REMOVAL  08/13/2009   Food impaction  . LEFT HEART CATHETERIZATION WITH CORONARY ANGIOGRAM N/A 11/01/2011   Procedure: LEFT  HEART CATHETERIZATION WITH CORONARY ANGIOGRAM;  Surgeon: Lorretta Harp, MD;  Location: [facility] CATH LAB;  Service: Cardiovascular;  Laterality: N/A;  . LEFT HEART CATHETERIZATION WITH CORONARY ANGIOGRAM N/A 06/18/2012   Procedure: LEFT HEART CATHETERIZATION WITH CORONARY ANGIOGRAM;  Surgeon: Peter M Martinique, MD;  Location: [facility] CATH LAB;  Service: Cardiovascular;  Laterality: N/A;  . LEFT HEART CATHETERIZATION WITH CORONARY/GRAFT ANGIOGRAM N/A 03/10/2012   Procedure: LEFT HEART CATHETERIZATION WITH [name];  Surgeon: Sherren Mocha, MD;  Location: [facility] CATH LAB;  Service: Cardiovascular;  Laterality: N/A;  . LEFT HEART CATHETERIZATION WITH CORONARY/GRAFT ANGIOGRAM  10/06/2012   Procedure: LEFT HEART CATHETERIZATION WITH [name];  Surgeon: Burnell Blanks, MD;  Location: [facility] CATH LAB;  Service: Cardiovascular;;  . LEFT HEART CATHETERIZATION WITH CORONARY/GRAFT ANGIOGRAM N/A 11/09/2012   Procedure: LEFT HEART CATHETERIZATION WITH [name];  Surgeon: Peter M Martinique, MD;  Location: [facility] CATH LAB;  Service: Cardiovascular;  Laterality: N/A;  . LEFT HEART CATHETERIZATION WITH CORONARY/GRAFT ANGIOGRAM N/A 05/19/2014   Procedure: LEFT HEART CATHETERIZATION WITH [name];  Surgeon: Troy Sine, MD;  Location: [facility] CATH LAB;  Service: Cardiovascular;  Laterality: N/A;  . PERCUTANEOUS CORONARY STENT INTERVENTION (PCI-S) N/A 11/01/2011   Procedure: PERCUTANEOUS CORONARY STENT INTERVENTION (PCI-S);  Surgeon: Lorretta Harp, MD;  Location: [facility] CATH LAB;  Service: Cardiovascular;  Laterality: N/A;  . PERCUTANEOUS CORONARY STENT INTERVENTION (PCI-S)  06/18/2012   Procedure: PERCUTANEOUS CORONARY STENT INTERVENTION (PCI-S);  Surgeon: Peter M Martinique, MD;  Location: [facility] CATH LAB;  Service: Cardiovascular;;  . PERCUTANEOUS CORONARY STENT INTERVENTION (PCI-S)  10/06/2012   Procedure: PERCUTANEOUS CORONARY STENT INTERVENTION (PCI-S);  Surgeon: Burnell Blanks,  MD;  Location: [facility] CATH LAB;  Service: Cardiovascular;;  . TONSILLECTOMY      Social History   Social History  . Marital status: Divorced    Spouse name: N/A  . Number of children: N/A  . Years of education: N/A   Occupational History  . Retired     Designer, television/film set   Social History Main Topics  . Smoking status: Former Smoker    Packs/day: 2.00    Years: 10.00    Types: Cigarettes    Start date: 07/14/1950    Quit date: 07/14/1961  . Smokeless tobacco: Former Systems developer    Types: Chew    Quit date: 12/21/2015  . Alcohol use No  . Drug use: No  . Sexual activity: No   Other Topics Concern  . Not on file   Social History Narrative   ** Merged History Encounter **...   He was raised by his uncle.  His mother deceased when   he was 47 year old with appendicitis. Father deceased from an MI  at age   103.  He has 1 sister, but he does not know her health status as they   have been separated.            Vitals:   08/18/16 1008  BP: (!) 114/58  Pulse: 69  SpO2: 98%  Weight: 167 lb (75.8 kg)  Height: 5\' 10"  (1.778 m)    PHYSICAL EXAM General: NAD HEENT: Normal. Neck: No JVD, no thyromegaly. Lungs: Diminished throughout, no rales/wheezes. CV: Nondisplaced PMI. Regular rate and rhythm, normal S1/S2, no S3/S4, no murmur. No pretibial or periankle edema.  Abdomen: Soft, no distention.  Neurologic: Alert and oriented.  Psych: Normal affect. Skin: Normal. Musculoskeletal: No gross deformities. Extremities: No clubbing or cyanosis.      ECG: Most recent ECG reviewed.      ASSESSMENT AND PLAN:  1. CAD/CABG: Stable. Continue current therapy with ASA , Lipitor, low-dose Coreg, Imdur, and Brilinta (given multiple coronary interventions, reduce to 60 mg BID). Would add Ranexa if he develops recurrent chest pain.  2. Essential HTN: Controlled on Coreg. No changes.  3. Hyperlipidemia: Continue Lipitor 40 mg daily.  Dispo: f/u 6 months  [name], M.D.,  F.A.C.C.

## 2017-02-18 ENCOUNTER — Encounter: Payer: Self-pay | Admitting: Gastroenterology

## 2017-03-17 ENCOUNTER — Encounter: Payer: Self-pay | Admitting: Cardiovascular Disease

## 2017-03-17 ENCOUNTER — Ambulatory Visit (INDEPENDENT_AMBULATORY_CARE_PROVIDER_SITE_OTHER): Payer: Medicare Other | Admitting: Cardiovascular Disease

## 2017-03-17 VITALS — BP 110/66 | HR 75 | Ht 70.0 in | Wt 187.0 lb

## 2017-03-17 DIAGNOSIS — I10 Essential (primary) hypertension: Secondary | ICD-10-CM

## 2017-03-17 DIAGNOSIS — I25708 Atherosclerosis of coronary artery bypass graft(s), unspecified, with other forms of angina pectoris: Secondary | ICD-10-CM | POA: Diagnosis not present

## 2017-03-17 DIAGNOSIS — I209 Angina pectoris, unspecified: Secondary | ICD-10-CM

## 2017-03-17 DIAGNOSIS — E785 Hyperlipidemia, unspecified: Secondary | ICD-10-CM | POA: Diagnosis not present

## 2017-03-17 NOTE — Patient Instructions (Signed)
Your physician wants you to follow-up in: 6 months with Dr Virgina Jock will receive a reminder letter in the mail two months in advance. If you don't receive a letter, please call our office to schedule the follow-up appointment.    Your physician recommends that you continue on your current medications as directed. Please refer to the Current Medication list given to you today.    If you need a refill on your cardiac medications before your next appointment, please call your pharmacy.    No lab work or testing ordered today.     Thank you for choosing [facility] !

## 2017-03-17 NOTE — Progress Notes (Addendum)
SUBJECTIVE: The patient presents for follow up of CAD and CABG. He is 83 yrs old.  05/2014 cath: LVEF 35%, occluded LAD, occluded LCX, occluded RCA. Patent LIMA-LAD, occluded SVG to OM1/OM2, occluded SVG-RCA. Patient with collaterals to RCA and OM territories.   Echo 05/27/15 LVEF 45-50% and grade I diastolic dysfunction.  Nuclear stress testing in November 2014 showed an inferolateral defect with a small amount of peri-infarct ischemia. He does not wish to undergo stress testing again.  He resides in a nursing home.  He denies chest pain and shortness of breath. He seldom has indigestion. He is occasionally dizzy and takes meclizine which helps.  ECG performed in the office today which I ordered and personally interpreted demonstrated sinus rhythm with a right bundle branch block.   Review of Systems: As per "subjective", otherwise negative.  No Known Allergies  Current Outpatient Prescriptions  Medication Sig Dispense Refill  . acetaminophen (TYLENOL) 325 MG tablet Take 2 tablets (650 mg total) by mouth every 4 (four) hours as needed for headache or mild pain.    Marland Kitchen albuterol (PROVENTIL HFA;VENTOLIN HFA) 108 (90 BASE) MCG/ACT inhaler Inhale 2 puffs into the lungs every 6 (six) hours as needed for wheezing or shortness of breath.    . ALPRAZolam (XANAX) 0.25 MG tablet Take 0.25 mg by mouth 2 (two) times daily.    Marland Kitchen aspirin EC 81 MG tablet Take 81 mg by mouth daily.    Marland Kitchen atorvastatin (LIPITOR) 40 MG tablet Take 1 tablet (40 mg total) by mouth daily at 6 PM. 30 tablet 11  . carvedilol (COREG) 3.125 MG tablet TAKE 1 TABLET BY MOUTH TWICE DAILY WITH MEALS. 60 tablet 6  . levothyroxine (SYNTHROID, LEVOTHROID) 50 MCG tablet Take 50 mcg by mouth daily before breakfast. Reported on 10/10/2015    . metFORMIN (GLUCOPHAGE) 1000 MG tablet Take 1,000 mg by mouth 2 (two) times daily with a meal.    . Multiple Vitamin (MULTIVITAMIN WITH MINERALS) TABS Take 1 tablet by mouth daily. Reported  on 10/10/2015    . NITROSTAT 0.4 MG SL tablet PLACE 1 TAB UNDER TONGUE EVERY 5 MIN IF NEEDED FOR CHEST PAIN. MAY USE 3 TIMES.NO RELIEF CALL 911. 25 tablet 3  . NOVOLOG FLEXPEN 100 UNIT/ML FlexPen Inject 100 Units into the skin every morning.     . pantoprazole (PROTONIX) 40 MG tablet Take 40 mg by mouth daily.    Marland Kitchen PARoxetine (PAXIL) 10 MG tablet Take 10 mg by mouth daily.    . polyethylene glycol powder (GLYCOLAX/MIRALAX) powder Take 1 Container by mouth once.    . tamsulosin (FLOMAX) 0.4 MG CAPS Take 0.4 mg by mouth 2 (two) times daily.     . ticagrelor (BRILINTA) 60 MG TABS tablet Take 1 tablet (60 mg total) by mouth 2 (two) times daily. 60 tablet 6   No current facility-administered medications for this visit.     Past Medical History:  Diagnosis Date  . Anxiety about health    "multiple somatic complaints"  . Benign prostatic hypertrophy    History of urinary retention  . Cervical vertebral fracture ([place])   . Chronic back pain   . Chronic chest pain   . Coronary atherosclerosis of native coronary artery    a. CABG x 4 in 1989 (VG->OM1->OM2, VG->RCA, LIMA->LAD), b. 05/2010: DES to VG-OM1/OM2, DES to distal LCx. c. NSTEMI in 04/2011 - TO distal LCX stent and VG->OM2. d. 02/2012 NSTEMI DES to VG-OM1/continuation to OM2 occluded. e.  inferior STEMI s/p DES to SVG-RAMUS 06/2012. f. inferolat STEMI 09/2012 s/p DES to SVG-interm; g. Lex MV (11/14):  EF 35%, inf-lat scar with small peri-infarct ischemia  . Diabetes mellitus, type II ([place])   . Essential hypertension, benign   . Gastroesophageal reflux disease   . History of pneumonia   . History of stroke   . Hyperlipidemia   . Hypothyroidism   . Ischemic cardiomyopathy Nov 2015   EF 35% cath, 45-50% by echo  . Osteoarthritis   . Peptic ulcer disease     Past Surgical History:  Procedure Laterality Date  . CARDIAC CATHETERIZATION  05/19/14   SVG-OM occl- medical Rx  . CORONARY ANGIOPLASTY  10/12, 8/13, 12/13, 3/14   SVG-OM PCI  .  CORONARY ARTERY BYPASS GRAFT  1989  . EGD WITH FOREIGN BODY REMOVAL  08/13/2009   Food impaction  . LEFT HEART CATHETERIZATION WITH CORONARY ANGIOGRAM N/A 11/01/2011   Procedure: LEFT HEART CATHETERIZATION WITH CORONARY ANGIOGRAM;  Surgeon: Lorretta Harp, MD;  Location: [facility] CATH LAB;  Service: Cardiovascular;  Laterality: N/A;  . LEFT HEART CATHETERIZATION WITH CORONARY ANGIOGRAM N/A 06/18/2012   Procedure: LEFT HEART CATHETERIZATION WITH CORONARY ANGIOGRAM;  Surgeon: Peter M Martinique, MD;  Location: [facility] CATH LAB;  Service: Cardiovascular;  Laterality: N/A;  . LEFT HEART CATHETERIZATION WITH CORONARY/GRAFT ANGIOGRAM N/A 03/10/2012   Procedure: LEFT HEART CATHETERIZATION WITH [name];  Surgeon: Sherren Mocha, MD;  Location: [facility] CATH LAB;  Service: Cardiovascular;  Laterality: N/A;  . LEFT HEART CATHETERIZATION WITH CORONARY/GRAFT ANGIOGRAM  10/06/2012   Procedure: LEFT HEART CATHETERIZATION WITH [name];  Surgeon: Burnell Blanks, MD;  Location: [facility] CATH LAB;  Service: Cardiovascular;;  . LEFT HEART CATHETERIZATION WITH CORONARY/GRAFT ANGIOGRAM N/A 11/09/2012   Procedure: LEFT HEART CATHETERIZATION WITH [name];  Surgeon: Peter M Martinique, MD;  Location: [facility] CATH LAB;  Service: Cardiovascular;  Laterality: N/A;  . LEFT HEART CATHETERIZATION WITH CORONARY/GRAFT ANGIOGRAM N/A 05/19/2014   Procedure: LEFT HEART CATHETERIZATION WITH [name];  Surgeon: Troy Sine, MD;  Location: [facility] CATH LAB;  Service: Cardiovascular;  Laterality: N/A;  . PERCUTANEOUS CORONARY STENT INTERVENTION (PCI-S) N/A 11/01/2011   Procedure: PERCUTANEOUS CORONARY STENT INTERVENTION (PCI-S);  Surgeon: Lorretta Harp, MD;  Location: [facility] CATH LAB;  Service: Cardiovascular;  Laterality: N/A;  . PERCUTANEOUS CORONARY STENT INTERVENTION (PCI-S)  06/18/2012   Procedure: PERCUTANEOUS CORONARY STENT INTERVENTION (PCI-S);  Surgeon: Peter M Martinique, MD;  Location: [facility] CATH LAB;   Service: Cardiovascular;;  . PERCUTANEOUS CORONARY STENT INTERVENTION (PCI-S)  10/06/2012   Procedure: PERCUTANEOUS CORONARY STENT INTERVENTION (PCI-S);  Surgeon: Burnell Blanks, MD;  Location: [facility] CATH LAB;  Service: Cardiovascular;;  . TONSILLECTOMY      Social History   Social History  . Marital status: Divorced    Spouse name: N/A  . Number of children: N/A  . Years of education: N/A   Occupational History  . Retired     Designer, television/film set   Social History Main Topics  . Smoking status: Former Smoker    Packs/day: 2.00    Years: 10.00    Types: Cigarettes    Start date: 07/14/1950    Quit date: 07/14/1961  . Smokeless tobacco: Former Systems developer    Types: Chew    Quit date: 12/21/2015  . Alcohol use No  . Drug use: No  . Sexual activity: No   Other Topics Concern  . Not on file   Social History Narrative   ** Merged History Encounter **.Marland KitchenMarland Kitchen  He was raised by his uncle.  His mother deceased when   he was 15 year old with appendicitis. Father deceased from an MI at age   18.  He has 1 sister, but he does not know her health status as they   have been separated.            Vitals:   03/17/17 1012  BP: 110/66  Pulse: 75  SpO2: 97%  Weight: 187 lb (84.8 kg)  Height: 5\' 10"  (1.778 m)    Wt Readings from Last 3 Encounters:  03/17/17 187 lb (84.8 kg)  08/18/16 167 lb (75.8 kg)  04/01/16 160 lb (72.6 kg)     PHYSICAL EXAM General: NAD HEENT: Normal. Neck: No JVD, no thyromegaly. Lungs: Clear to auscultation bilaterally with normal respiratory effort. CV: Nondisplaced PMI.  Regular rate and rhythm, normal S1/S2, no S3/S4, no murmur. No pretibial or periankle edema.     Abdomen: Soft, nontender, no distention.  Neurologic: Alert and oriented.  Psych: Normal affect.     ECG: Most recent ECG reviewed.   Labs: Lab Results  Component Value Date/Time   K 4.7 12/05/2015 08:26 AM   BUN 15 12/05/2015 08:26 AM   CREATININE 0.96 12/05/2015 08:26 AM   CREATININE 0.94  06/21/2014 03:01 PM   ALT 26 09/19/2015 04:29 PM   TSH 2.710 12/05/2015 08:26 AM   HGB 13.2 11/23/2015 05:35 PM     Lipids: Lab Results  Component Value Date/Time   LDLCALC 56 12/05/2015 08:26 AM   CHOL 118 12/05/2015 08:26 AM   TRIG 107 12/05/2015 08:26 AM   HDL 41 12/05/2015 08:26 AM       ASSESSMENT AND PLAN:  1. CAD/CABG: Symptomatically stable. Continue current therapy with ASA , Lipitor, low-dose Coreg, and Brilinta (given multiple coronary interventions, continue 60 mg BID). No longer on Imdur. Would add Ranexa if he develops recurrent chest pain.  2. Essential HTN: Controlled on Coreg. No changes.  3. Hyperlipidemia: Continue Lipitor 40 mg daily.     Disposition: Follow up 6 months.   [name], M.D., F.A.C.C.

## 2017-03-18 ENCOUNTER — Emergency Department (HOSPITAL_COMMUNITY): Payer: Medicare Other

## 2017-03-18 ENCOUNTER — Encounter (HOSPITAL_COMMUNITY): Payer: Self-pay

## 2017-03-18 ENCOUNTER — Inpatient Hospital Stay (HOSPITAL_COMMUNITY)
Admission: EM | Admit: 2017-03-18 | Discharge: 2017-03-25 | DRG: 470 | Disposition: A | Discharge status: Skilled Nursing Facility | Payer: Medicare Other | Attending: Internal Medicine | Admitting: Internal Medicine

## 2017-03-18 DIAGNOSIS — S72002S Fracture of unspecified part of neck of left femur, sequela: Secondary | ICD-10-CM | POA: Diagnosis not present

## 2017-03-18 DIAGNOSIS — Z87891 Personal history of nicotine dependence: Secondary | ICD-10-CM

## 2017-03-18 DIAGNOSIS — Z794 Long term (current) use of insulin: Secondary | ICD-10-CM | POA: Diagnosis not present

## 2017-03-18 DIAGNOSIS — Z79899 Other long term (current) drug therapy: Secondary | ICD-10-CM

## 2017-03-18 DIAGNOSIS — G8929 Other chronic pain: Secondary | ICD-10-CM | POA: Diagnosis present

## 2017-03-18 DIAGNOSIS — M549 Dorsalgia, unspecified: Secondary | ICD-10-CM | POA: Diagnosis present

## 2017-03-18 DIAGNOSIS — S72012A Unspecified intracapsular fracture of left femur, initial encounter for closed fracture: Principal | ICD-10-CM | POA: Diagnosis present

## 2017-03-18 DIAGNOSIS — W010XXA Fall on same level from slipping, tripping and stumbling without subsequent striking against object, initial encounter: Secondary | ICD-10-CM | POA: Diagnosis present

## 2017-03-18 DIAGNOSIS — Z419 Encounter for procedure for purposes other than remedying health state, unspecified: Secondary | ICD-10-CM

## 2017-03-18 DIAGNOSIS — Z7982 Long term (current) use of aspirin: Secondary | ICD-10-CM | POA: Diagnosis not present

## 2017-03-18 DIAGNOSIS — R509 Fever, unspecified: Secondary | ICD-10-CM

## 2017-03-18 DIAGNOSIS — Z955 Presence of coronary angioplasty implant and graft: Secondary | ICD-10-CM

## 2017-03-18 DIAGNOSIS — I110 Hypertensive heart disease with heart failure: Secondary | ICD-10-CM | POA: Diagnosis present

## 2017-03-18 DIAGNOSIS — E785 Hyperlipidemia, unspecified: Secondary | ICD-10-CM | POA: Diagnosis present

## 2017-03-18 DIAGNOSIS — M4802 Spinal stenosis, cervical region: Secondary | ICD-10-CM | POA: Diagnosis present

## 2017-03-18 DIAGNOSIS — Z09 Encounter for follow-up examination after completed treatment for conditions other than malignant neoplasm: Secondary | ICD-10-CM

## 2017-03-18 DIAGNOSIS — D696 Thrombocytopenia, unspecified: Secondary | ICD-10-CM | POA: Diagnosis present

## 2017-03-18 DIAGNOSIS — I10 Essential (primary) hypertension: Secondary | ICD-10-CM | POA: Diagnosis not present

## 2017-03-18 DIAGNOSIS — I2510 Atherosclerotic heart disease of native coronary artery without angina pectoris: Secondary | ICD-10-CM | POA: Diagnosis present

## 2017-03-18 DIAGNOSIS — S72002A Fracture of unspecified part of neck of left femur, initial encounter for closed fracture: Secondary | ICD-10-CM | POA: Diagnosis present

## 2017-03-18 DIAGNOSIS — E039 Hypothyroidism, unspecified: Secondary | ICD-10-CM | POA: Diagnosis present

## 2017-03-18 DIAGNOSIS — I5030 Unspecified diastolic (congestive) heart failure: Secondary | ICD-10-CM | POA: Diagnosis present

## 2017-03-18 DIAGNOSIS — E1159 Type 2 diabetes mellitus with other circulatory complications: Secondary | ICD-10-CM | POA: Diagnosis present

## 2017-03-18 DIAGNOSIS — I252 Old myocardial infarction: Secondary | ICD-10-CM | POA: Diagnosis not present

## 2017-03-18 DIAGNOSIS — Z8673 Personal history of transient ischemic attack (TIA), and cerebral infarction without residual deficits: Secondary | ICD-10-CM | POA: Diagnosis not present

## 2017-03-18 DIAGNOSIS — K219 Gastro-esophageal reflux disease without esophagitis: Secondary | ICD-10-CM | POA: Diagnosis present

## 2017-03-18 DIAGNOSIS — E119 Type 2 diabetes mellitus without complications: Secondary | ICD-10-CM | POA: Diagnosis not present

## 2017-03-18 HISTORY — DX: ST elevation (STEMI) myocardial infarction of unspecified site: I21.3

## 2017-03-18 HISTORY — DX: Dysphagia, unspecified: R13.10

## 2017-03-18 HISTORY — DX: Dizziness and giddiness: R42

## 2017-03-18 HISTORY — DX: Heart failure, unspecified: I50.9

## 2017-03-18 HISTORY — DX: Peptic ulcer, site unspecified, unspecified as acute or chronic, without hemorrhage or perforation: K27.9

## 2017-03-18 HISTORY — DX: Non-ST elevation (NSTEMI) myocardial infarction: I21.4

## 2017-03-18 HISTORY — DX: Syncope and collapse: R55

## 2017-03-18 LAB — CBG MONITORING, ED
Glucose-Capillary: 191 mg/dL — ABNORMAL HIGH (ref 65–99)
Glucose-Capillary: 110 mg/dL — ABNORMAL HIGH (ref 65–99)

## 2017-03-18 LAB — COMPREHENSIVE METABOLIC PANEL
ALT: 18 U/L (ref 17–63)
ANION GAP: 9 (ref 5–15)
AST: 22 U/L (ref 15–41)
Albumin: 3.8 g/dL (ref 3.5–5.0)
Alkaline Phosphatase: 110 U/L (ref 38–126)
BUN: 15 mg/dL (ref 6–20)
CO2: 24 mmol/L (ref 22–32)
CREATININE: 0.85 mg/dL (ref 0.61–1.24)
Calcium: 9.3 mg/dL (ref 8.9–10.3)
Chloride: 103 mmol/L (ref 101–111)
GFR calc non Af Amer: >60 mL/min (ref >60)
GLUCOSE: 151 mg/dL — AB (ref 65–99)
POTASSIUM: 3.9 mmol/L (ref 3.5–5.1)
SODIUM: 136 mmol/L (ref 135–145)
Total Bilirubin: 0.5 mg/dL (ref 0.3–1.2)
Total Protein: 6.6 g/dL (ref 6.5–8.1)

## 2017-03-18 LAB — CBC WITH DIFFERENTIAL/PLATELET
Basophils Absolute: 0 10*3/uL (ref 0.0–0.1)
Basophils Relative: 0 %
EOS ABS: 0.2 10*3/uL (ref 0.0–0.7)
EOS PCT: 4 %
HCT: 38.9 % — ABNORMAL LOW (ref 39.0–52.0)
Hemoglobin: 13 g/dL (ref 13.0–17.0)
LYMPHS PCT: 14 %
Lymphs Abs: 0.9 10*3/uL (ref 0.7–4.0)
MCH: 31.7 pg (ref 26.0–34.0)
MCHC: 33.4 g/dL (ref 30.0–36.0)
MCV: 94.9 fL (ref 78.0–100.0)
Monocytes Absolute: 0.5 10*3/uL (ref 0.1–1.0)
Monocytes Relative: 8 %
Neutro Abs: 4.7 10*3/uL (ref 1.7–7.7)
Neutrophils Relative %: 74 %
PLATELETS: 137 10*3/uL — AB (ref 150–400)
RBC: 4.1 MIL/uL — AB (ref 4.22–5.81)
RDW: 12.6 % (ref 11.5–15.5)
WBC: 6.3 10*3/uL (ref 4.0–10.5)

## 2017-03-18 MED ORDER — HYDROMORPHONE HCL 1 MG/ML IJ SOLN
1.0000 mg | Freq: Once | INTRAMUSCULAR | Status: AC
  Administered 2017-03-18: 1 mg via INTRAVENOUS
  Filled 2017-03-18: qty 1

## 2017-03-18 MED ORDER — PAROXETINE HCL 20 MG PO TABS
20.0000 mg | ORAL_TABLET | Freq: Every day | ORAL | Status: DC
  Administered 2017-03-20 – 2017-03-25 (×5): 20 mg via ORAL
  Filled 2017-03-18 (×6): qty 1

## 2017-03-18 MED ORDER — PANTOPRAZOLE SODIUM 40 MG PO TBEC
40.0000 mg | DELAYED_RELEASE_TABLET | Freq: Every day | ORAL | Status: DC
  Administered 2017-03-18 – 2017-03-25 (×7): 40 mg via ORAL
  Filled 2017-03-18 (×7): qty 1

## 2017-03-18 MED ORDER — FINASTERIDE 5 MG PO TABS
5.0000 mg | ORAL_TABLET | Freq: Every day | ORAL | Status: DC
  Administered 2017-03-20 – 2017-03-25 (×5): 5 mg via ORAL
  Filled 2017-03-18 (×6): qty 1

## 2017-03-18 MED ORDER — ASPIRIN EC 81 MG PO TBEC
81.0000 mg | DELAYED_RELEASE_TABLET | Freq: Every day | ORAL | Status: DC
  Administered 2017-03-18 – 2017-03-25 (×7): 81 mg via ORAL
  Filled 2017-03-18 (×7): qty 1

## 2017-03-18 MED ORDER — ATORVASTATIN CALCIUM 40 MG PO TABS
40.0000 mg | ORAL_TABLET | Freq: Every day | ORAL | Status: DC
  Administered 2017-03-18 – 2017-03-24 (×5): 40 mg via ORAL
  Filled 2017-03-18 (×6): qty 1

## 2017-03-18 MED ORDER — ALUM & MAG HYDROXIDE-SIMETH 200-200-20 MG/5ML PO SUSP: 15.0000 mL | Freq: Four times a day (QID) | ORAL | Status: DC | PRN

## 2017-03-18 MED ORDER — INSULIN ASPART 100 UNIT/ML ~~LOC~~ SOLN
0.0000 [IU] | Freq: Three times a day (TID) | SUBCUTANEOUS | Status: DC
  Administered 2017-03-18: 3 [IU] via SUBCUTANEOUS
  Administered 2017-03-19 (×2): 2 [IU] via SUBCUTANEOUS
  Administered 2017-03-20 (×2): 3 [IU] via SUBCUTANEOUS
  Administered 2017-03-20: 8 [IU] via SUBCUTANEOUS
  Administered 2017-03-21 (×3): 2 [IU] via SUBCUTANEOUS
  Administered 2017-03-22 – 2017-03-23 (×4): 3 [IU] via SUBCUTANEOUS
  Administered 2017-03-23: 2 [IU] via SUBCUTANEOUS
  Administered 2017-03-24: 3 [IU] via SUBCUTANEOUS
  Administered 2017-03-24: 8 [IU] via SUBCUTANEOUS
  Administered 2017-03-24 – 2017-03-25 (×2): 3 [IU] via SUBCUTANEOUS
  Administered 2017-03-25: 8 [IU] via SUBCUTANEOUS
  Filled 2017-03-18 (×3): qty 1

## 2017-03-18 MED ORDER — ENOXAPARIN SODIUM 40 MG/0.4ML ~~LOC~~ SOLN
40.0000 mg | SUBCUTANEOUS | Status: DC
  Administered 2017-03-18 – 2017-03-22 (×5): 40 mg via SUBCUTANEOUS
  Filled 2017-03-18 (×4): qty 0.4

## 2017-03-18 MED ORDER — LOPERAMIDE HCL 2 MG PO CAPS: 2.0000 mg | ORAL_CAPSULE | Freq: Four times a day (QID) | ORAL | Status: DC | PRN

## 2017-03-18 MED ORDER — TAMSULOSIN HCL 0.4 MG PO CAPS
0.4000 mg | ORAL_CAPSULE | Freq: Every day | ORAL | Status: DC
  Administered 2017-03-20 – 2017-03-25 (×5): 0.4 mg via ORAL
  Filled 2017-03-18 (×5): qty 1

## 2017-03-18 MED ORDER — FLUDROCORTISONE ACETATE 0.1 MG PO TABS
0.1000 mg | ORAL_TABLET | Freq: Every day | ORAL | Status: DC
  Administered 2017-03-20 – 2017-03-25 (×5): 0.1 mg via ORAL
  Filled 2017-03-18 (×6): qty 1

## 2017-03-18 MED ORDER — LEVOTHYROXINE SODIUM 50 MCG PO TABS
50.0000 ug | ORAL_TABLET | Freq: Every day | ORAL | Status: DC
  Administered 2017-03-19 – 2017-03-25 (×6): 50 ug via ORAL
  Filled 2017-03-18 (×6): qty 1

## 2017-03-18 MED ORDER — POLYETHYLENE GLYCOL 3350 17 G PO PACK
17.0000 g | PACK | Freq: Every day | ORAL | Status: DC | PRN
  Administered 2017-03-21 – 2017-03-25 (×2): 17 g via ORAL
  Filled 2017-03-18 (×2): qty 1

## 2017-03-18 MED ORDER — HYDROMORPHONE HCL 1 MG/ML IJ SOLN
0.5000 mg | Freq: Once | INTRAMUSCULAR | Status: AC
  Administered 2017-03-18: 0.5 mg via INTRAVENOUS
  Filled 2017-03-18: qty 1

## 2017-03-18 MED ORDER — MORPHINE SULFATE (PF) 2 MG/ML IV SOLN
0.5000 mg | INTRAVENOUS | Status: DC | PRN
  Administered 2017-03-18 – 2017-03-19 (×5): 0.5 mg via INTRAVENOUS
  Filled 2017-03-18 (×5): qty 1

## 2017-03-18 MED ORDER — MECLIZINE HCL 25 MG PO TABS
25.0000 mg | ORAL_TABLET | Freq: Three times a day (TID) | ORAL | Status: DC | PRN
  Filled 2017-03-18: qty 1

## 2017-03-18 MED ORDER — POTASSIUM CHLORIDE CRYS ER 20 MEQ PO TBCR
40.0000 meq | EXTENDED_RELEASE_TABLET | Freq: Two times a day (BID) | ORAL | Status: DC
  Administered 2017-03-18 – 2017-03-25 (×13): 40 meq via ORAL
  Filled 2017-03-18 (×13): qty 2

## 2017-03-18 MED ORDER — HYDROCODONE-ACETAMINOPHEN 5-325 MG PO TABS
1.0000 | ORAL_TABLET | Freq: Four times a day (QID) | ORAL | Status: DC | PRN
  Administered 2017-03-18: 2 via ORAL
  Administered 2017-03-19: 1 via ORAL
  Administered 2017-03-20 (×2): 2 via ORAL
  Administered 2017-03-21: 1 via ORAL
  Administered 2017-03-22 – 2017-03-23 (×2): 2 via ORAL
  Filled 2017-03-18 (×3): qty 2
  Filled 2017-03-18: qty 1
  Filled 2017-03-18: qty 2
  Filled 2017-03-18: qty 1
  Filled 2017-03-18: qty 2

## 2017-03-18 MED ORDER — INSULIN ASPART 100 UNIT/ML ~~LOC~~ SOLN: 0.0000 [IU] | Freq: Every day | SUBCUTANEOUS | Status: DC

## 2017-03-18 MED ORDER — ALPRAZOLAM 0.25 MG PO TABS
0.2500 mg | ORAL_TABLET | Freq: Two times a day (BID) | ORAL | Status: DC
  Administered 2017-03-18 – 2017-03-25 (×13): 0.25 mg via ORAL
  Filled 2017-03-18 (×13): qty 1

## 2017-03-18 MED ORDER — INSULIN GLARGINE 100 UNIT/ML ~~LOC~~ SOLN
10.0000 [IU] | Freq: Every day | SUBCUTANEOUS | Status: DC
  Administered 2017-03-18 – 2017-03-24 (×7): 10 [IU] via SUBCUTANEOUS
  Filled 2017-03-18 (×9): qty 0.1

## 2017-03-18 NOTE — ED Provider Notes (Signed)
[facility] DEPT Provider Note   CSN: [id number] Arrival date & time: 03/18/17  0721     History   Chief Complaint Chief Complaint  Patient presents with  . Fall    HPI ANTION ANDRES is a 83 y.o. male.  Patient tripped and fell and hurt his left hip today   The history is provided by the patient.  Fall  This is a new problem. The current episode started 6 to 12 hours ago. The problem occurs rarely. The problem has been resolved. Pertinent negatives include no chest pain, no abdominal pain and no headaches. Nothing aggravates the symptoms. Nothing relieves the symptoms. He has tried nothing for the symptoms. The treatment provided no relief.    Past Medical History:  Diagnosis Date  . Anxiety about health    "multiple somatic complaints"  . Benign prostatic hypertrophy    History of urinary retention  . Cervical vertebral fracture ([place])   . CHF (congestive heart failure) ([place])   . Chronic back pain   . Chronic chest pain   . Coronary atherosclerosis of native coronary artery    a. CABG x 4 in 1989 (VG->OM1->OM2, VG->RCA, LIMA->LAD), b. 05/2010: DES to VG-OM1/OM2, DES to distal LCx. c. NSTEMI in 04/2011 - TO distal LCX stent and VG->OM2. d. 02/2012 NSTEMI DES to VG-OM1/continuation to OM2 occluded. e. inferior STEMI s/p DES to SVG-RAMUS 06/2012. f. inferolat STEMI 09/2012 s/p DES to SVG-interm; g. Lex MV (11/14):  EF 35%, inf-lat scar with small peri-infarct ischemia  . Diabetes mellitus, type II ([place])   . Dizziness and giddiness   . Dysphagia   . Essential hypertension, benign   . Gastroesophageal reflux disease   . GERD (gastroesophageal reflux disease)   . History of pneumonia   . History of stroke   . Hyperlipidemia   . Hyperlipidemia   . Hypothyroidism   . Ischemic cardiomyopathy Nov 2015   EF 35% cath, 45-50% by echo  . MI, acute, non ST segment elevation ([place])   . Osteoarthritis   . Peptic ulcer   . Peptic ulcer disease   . ST elevation (STEMI)  myocardial infarction ([place])   . Stroke ([place])   . Syncope and collapse     Patient Active Problem List   Diagnosis Date Noted  . Constipation 12/11/2015  . Hypothyroidism 10/17/2015  . DM type 2 causing vascular disease ([place]) 10/17/2015  . GERD (gastroesophageal reflux disease) 05/27/2015  . Lip mass 05/26/2015  . Atypical chest pain 03/02/2015  . Dyspepsia 03/02/2015  . Abdominal pain, epigastric 03/02/2015  . Essential hypertension 03/02/2015  . Angina at rest [facility]) 06/09/2014  . Abnormal ECG 06/09/2014  . Chest pain at rest 06/09/2014  . Dizziness 06/01/2014  . Unstable angina ([place]) 05/19/2014  . History of Clostridium difficile 11/05/2013  . Diarrhea 08/27/2013  . C. difficile diarrhea 08/27/2013  . Chest pain 06/16/2013  . Precordial chest pain 05/29/2013  . Coronary atherosclerosis of native coronary artery 05/29/2013  . Noncompliance 11/22/2012  . Anemia, normocytic normochromic   . Cardiomyopathy, ischemic 06/21/2012  . Cerebrovascular disease 06/08/2012  . Diabetes mellitus, type II ([place])   . Arteriosclerotic cardiovascular disease (ASCVD) 10/25/2011  . Hyperlipidemia 10/24/2011  . BPH (benign prostatic hyperplasia) 05/07/2011    Past Surgical History:  Procedure Laterality Date  . CARDIAC CATHETERIZATION  05/19/14   SVG-OM occl- medical Rx  . CORONARY ANGIOPLASTY  10/12, 8/13, 12/13, 3/14   SVG-OM PCI  . CORONARY ARTERY BYPASS GRAFT  1989  .  EGD WITH FOREIGN BODY REMOVAL  08/13/2009   Food impaction  . LEFT HEART CATHETERIZATION WITH CORONARY ANGIOGRAM N/A 11/01/2011   Procedure: LEFT HEART CATHETERIZATION WITH CORONARY ANGIOGRAM;  Surgeon: Lorretta Harp, MD;  Location: [facility] CATH LAB;  Service: Cardiovascular;  Laterality: N/A;  . LEFT HEART CATHETERIZATION WITH CORONARY ANGIOGRAM N/A 06/18/2012   Procedure: LEFT HEART CATHETERIZATION WITH CORONARY ANGIOGRAM;  Surgeon: Peter M Martinique, MD;  Location: [facility] CATH LAB;  Service: Cardiovascular;  Laterality: N/A;  .  LEFT HEART CATHETERIZATION WITH CORONARY/GRAFT ANGIOGRAM N/A 03/10/2012   Procedure: LEFT HEART CATHETERIZATION WITH [name];  Surgeon: Sherren Mocha, MD;  Location: [facility] CATH LAB;  Service: Cardiovascular;  Laterality: N/A;  . LEFT HEART CATHETERIZATION WITH CORONARY/GRAFT ANGIOGRAM  10/06/2012   Procedure: LEFT HEART CATHETERIZATION WITH [name];  Surgeon: Burnell Blanks, MD;  Location: [facility] CATH LAB;  Service: Cardiovascular;;  . LEFT HEART CATHETERIZATION WITH CORONARY/GRAFT ANGIOGRAM N/A 11/09/2012   Procedure: LEFT HEART CATHETERIZATION WITH [name];  Surgeon: Peter M Martinique, MD;  Location: [facility] CATH LAB;  Service: Cardiovascular;  Laterality: N/A;  . LEFT HEART CATHETERIZATION WITH CORONARY/GRAFT ANGIOGRAM N/A 05/19/2014   Procedure: LEFT HEART CATHETERIZATION WITH [name];  Surgeon: Troy Sine, MD;  Location: [facility] CATH LAB;  Service: Cardiovascular;  Laterality: N/A;  . PERCUTANEOUS CORONARY STENT INTERVENTION (PCI-S) N/A 11/01/2011   Procedure: PERCUTANEOUS CORONARY STENT INTERVENTION (PCI-S);  Surgeon: Lorretta Harp, MD;  Location: [facility] CATH LAB;  Service: Cardiovascular;  Laterality: N/A;  . PERCUTANEOUS CORONARY STENT INTERVENTION (PCI-S)  06/18/2012   Procedure: PERCUTANEOUS CORONARY STENT INTERVENTION (PCI-S);  Surgeon: Peter M Martinique, MD;  Location: [facility] CATH LAB;  Service: Cardiovascular;;  . PERCUTANEOUS CORONARY STENT INTERVENTION (PCI-S)  10/06/2012   Procedure: PERCUTANEOUS CORONARY STENT INTERVENTION (PCI-S);  Surgeon: Burnell Blanks, MD;  Location: [facility] CATH LAB;  Service: Cardiovascular;;  . TONSILLECTOMY         Home Medications    Prior to Admission medications   Medication Sig Start Date End Date Taking? Authorizing Provider  finasteride (PROSCAR) 5 MG tablet Take 5 mg by mouth daily.   Yes [provider]  potassium chloride SA (K-DUR,KLOR-CON) 20 MEQ tablet Take 40 mEq by mouth 2 (two)  times daily.   Yes [provider]  acetaminophen (TYLENOL) 325 MG tablet Take 2 tablets (650 mg total) by mouth every 4 (four) hours as needed for headache or mild pain. 05/21/14   Erlene Quan, PA-C  albuterol (PROVENTIL HFA;VENTOLIN HFA) 108 (90 BASE) MCG/ACT inhaler Inhale 2 puffs into the lungs every 6 (six) hours as needed for wheezing or shortness of breath.    [provider]  ALPRAZolam Duanne Moron) 0.25 MG tablet Take 0.25 mg by mouth 2 (two) times daily.    [provider]  aspirin EC 81 MG tablet Take 81 mg by mouth daily.    [provider]  atorvastatin (LIPITOR) 40 MG tablet Take 1 tablet (40 mg total) by mouth daily at 6 PM. 05/29/13   Weaver, Nicki Reaper T, PA-C  carvedilol (COREG) 3.125 MG tablet TAKE 1 TABLET BY MOUTH TWICE DAILY WITH MEALS. 07/26/15   Lendon Colonel, NP  levothyroxine (SYNTHROID, LEVOTHROID) 50 MCG tablet Take 50 mcg by mouth daily before breakfast. Reported on 10/10/2015 09/08/14   [provider]  metFORMIN (GLUCOPHAGE) 1000 MG tablet Take 1,000 mg by mouth 2 (two) times daily with a meal.    [provider]  Multiple Vitamin (MULTIVITAMIN WITH MINERALS) TABS  Take 1 tablet by mouth daily. Reported on 10/10/2015    [provider]  NITROSTAT 0.4 MG SL tablet PLACE 1 TAB UNDER TONGUE EVERY 5 MIN IF NEEDED FOR CHEST PAIN. MAY USE 3 TIMES.NO RELIEF CALL 911. 07/04/15   Lendon Colonel, NP  NOVOLOG FLEXPEN 100 UNIT/ML FlexPen Inject 100 Units into the skin every morning.  12/13/15   [provider]  pantoprazole (PROTONIX) 40 MG tablet Take 40 mg by mouth daily.    [provider]  PARoxetine (PAXIL) 10 MG tablet Take 10 mg by mouth daily.    [provider]  polyethylene glycol powder (GLYCOLAX/MIRALAX) powder Take 1 Container by mouth once.    [provider]  tamsulosin (FLOMAX) 0.4 MG CAPS Take 0.4 mg by mouth 2 (two) times daily.  01/27/13   [provider]    ticagrelor (BRILINTA) 60 MG TABS tablet Take 1 tablet (60 mg total) by mouth 2 (two) times daily. 08/18/16   Herminio Commons, MD    Family History Family History  Problem Relation Age of Onset  . Appendicitis Mother        Pt was 53 year old  . Heart attack Father 48  . Early death Unknown        Parents died young  . Colon cancer Neg Hx     Social History Social History  Substance Use Topics  . Smoking status: Former Smoker    Packs/day: 2.00    Years: 10.00    Types: Cigarettes    Start date: 07/14/1950    Quit date: 07/14/1961  . Smokeless tobacco: Former Systems developer    Types: Chew    Quit date: 12/21/2015  . Alcohol use No     Allergies   Patient has no known allergies.   Review of Systems Review of Systems  Constitutional: Negative for appetite change and fatigue.  HENT: Negative for congestion, ear discharge and sinus pressure.   Eyes: Negative for discharge.  Respiratory: Negative for cough.   Cardiovascular: Negative for chest pain.  Gastrointestinal: Negative for abdominal pain and diarrhea.  Genitourinary: Negative for frequency and hematuria.  Musculoskeletal: Negative for back pain.       Left hip pain  Skin: Negative for rash.  Neurological: Negative for seizures and headaches.  Psychiatric/Behavioral: Negative for hallucinations.     Physical Exam Updated Vital Signs BP (!) 162/82   Pulse 77   Temp 97.7 F (36.5 C) (Oral)   Resp 20   Ht 5\' 10"  (1.778 m)   Wt 80.7 kg (178 lb)   SpO2 99%   BMI 25.54 kg/m   Physical Exam  Constitutional: He is oriented to person, place, and time. He appears well-developed.  HENT:  Head: Normocephalic.  Eyes: Conjunctivae and EOM are normal. No scleral icterus.  Neck: Neck supple. No thyromegaly present.  Cardiovascular: Normal rate and regular rhythm.  Exam reveals no gallop and no friction rub.   No murmur heard. Pulmonary/Chest: No stridor. He has no wheezes. He has no rales. He exhibits no tenderness.   Abdominal: He exhibits no distension. There is no tenderness. There is no rebound.  Musculoskeletal: Normal range of motion. He exhibits no edema.  Tender left hip  Lymphadenopathy:    He has no cervical adenopathy.  Neurological: He is oriented to person, place, and time. He exhibits normal muscle tone. Coordination normal.  Skin: No rash noted. No erythema.  Psychiatric: He has a normal mood and affect. His behavior is  normal.     ED Treatments / Results  Labs (all labs ordered are listed, but only abnormal results are displayed) Labs Reviewed  CBC WITH DIFFERENTIAL/PLATELET - Abnormal; Notable for the following:       Result Value   RBC 4.10 (*)    HCT 38.9 (*)    Platelets 137 (*)    All other components within normal limits  COMPREHENSIVE METABOLIC PANEL - Abnormal; Notable for the following:    Glucose, Bld 151 (*)    All other components within normal limits    EKG  EKG Interpretation None       Radiology Ct Head Wo Contrast  Result Date: 03/18/2017 CLINICAL DATA:  Pain following fall EXAM: CT HEAD WITHOUT CONTRAST CT CERVICAL SPINE WITHOUT CONTRAST TECHNIQUE: Multidetector CT imaging of the head and cervical spine was performed following the standard protocol without intravenous contrast. Multiplanar CT image reconstructions of the cervical spine were also generated. COMPARISON:  Head CT March 21, 2012; cervical spine CT April 07, 2010 FINDINGS: CT HEAD FINDINGS Brain: There is moderate diffuse atrophy. There is no intracranial mass, hemorrhage, extra-axial fluid collection, or midline shift. There is mild patchy small vessel disease in the centra semiovale bilaterally. Elsewhere gray-white compartments appear normal. No evident acute infarct. Note that a prior focal infarct in the right pons is less well seen at this time. Vascular: No hyperdense vessel. There is calcification in each carotid siphon region. There is also calcification in each distal vertebral  artery. Skull: Bony calvarium appears intact. Sinuses/Orbits: There is mucosal thickening in multiple ethmoid air cells bilaterally. There is slight mucosal thickening in the inferior right frontal sinus. There is slight mucosal thickening in each posterior maxillary antrum. Other visualized paranasal sinuses are clear. Orbits appear symmetric bilaterally. Other: Mastoid air cells clear. CT CERVICAL SPINE FINDINGS Alignment: There is just under 4 mm of anterolisthesis of C3 on C4, not present on prior study. There is 3 mm of anterolisthesis of C4 on C5, present on prior study. Skull base and vertebrae: There is a moderate pannus posterior to the odontoid which is not causing appreciable impression on the craniocervical junction. There is bony hypertrophy along the superior odontoid, unchanged, not causing impression on the craniocervical junction. There is cystic change in the odontoid, stable. There is no acute fracture. There is no well-defined blastic or lytic bone lesion. In comparison with the previous study, there is irregularity and erosive change involving the inferior endplate of the C3 vertebral body. This appearance may be indicative of a degree of discitis. This appearance represents a significant change from prior study. Soft tissues and spinal canal: Prevertebral soft tissues and predental space regions are normal. No paraspinous lesions are noted. There is no cord edema or consolidation. There is again noted a bony fragment posterior to C3-4 toward the left which is impressing on the cord from a ventral approach causing moderately severe spinal stenosis focally. This finding was present on prior study and stable. Disc levels: There is marked disc space narrowing with partial ankylosis at C5-6 and C6-7, also present on previous study. There is moderate disc space narrowing at C3-4 with concern for discitis involving the inferior C3 endplate as noted above. There is multifocal exit foraminal narrowing  due to bony hypertrophy. No frank disc extrusion. Spinal stenosis at C3-4 due to the previously noted bony fragment is a stable finding. There is nuchal ligament calcification posterior to C5. Upper chest: Visualized upper lung zones are clear. There is aortic  atherosclerosis. There is calcification in the left and right subclavian arteries. Other: There is bilateral carotid artery calcification. IMPRESSION: CT head: Atrophy with patchy periventricular small vessel disease. No intracranial mass hemorrhage, or extra-axial fluid collection. No acute infarct evident. There are foci of arterial vascular calcification. Areas of paranasal sinus disease noted. CT cervical spine: 1.  No acute fracture is evident. 2. Areas of spondylolisthesis at C3-4 and C4-5. The spondylolisthesis at C3-4 was not present previously. 3. Irregularity of the endplate of the C3 vertebral body inferiorly. This appearance is indicative of either erosive arthropathy or possible discitis. Given concern for potential degree of discitis, cervical MRI pre and post-contrast is felt to be warranted. This study could be performed nonemergently but is warranted. 4. Multilevel arthropathy. Disc space narrowing is most marked at C5-6 and C6-7. There is ankylosis at C5-6. 5. Persistent bony fragment within the canal at the C3-4 level on the left causing impression on the cord and fairly severe stenosis. MR could be helpful for further delineation of the cord in this area. 6. Aortic atherosclerosis. Vascular calcifications at multiple levels including foci of carotid artery calcification bilaterally. Aortic Atherosclerosis (ICD10-I70.0). Electronically Signed   By: Lowella Grip III M.D.   On: 03/18/2017 10:31   Ct Cervical Spine Wo Contrast  Result Date: 03/18/2017 CLINICAL DATA:  Pain following fall EXAM: CT HEAD WITHOUT CONTRAST CT CERVICAL SPINE WITHOUT CONTRAST TECHNIQUE: Multidetector CT imaging of the head and cervical spine was performed  following the standard protocol without intravenous contrast. Multiplanar CT image reconstructions of the cervical spine were also generated. COMPARISON:  Head CT March 21, 2012; cervical spine CT April 07, 2010 FINDINGS: CT HEAD FINDINGS Brain: There is moderate diffuse atrophy. There is no intracranial mass, hemorrhage, extra-axial fluid collection, or midline shift. There is mild patchy small vessel disease in the centra semiovale bilaterally. Elsewhere gray-white compartments appear normal. No evident acute infarct. Note that a prior focal infarct in the right pons is less well seen at this time. Vascular: No hyperdense vessel. There is calcification in each carotid siphon region. There is also calcification in each distal vertebral artery. Skull: Bony calvarium appears intact. Sinuses/Orbits: There is mucosal thickening in multiple ethmoid air cells bilaterally. There is slight mucosal thickening in the inferior right frontal sinus. There is slight mucosal thickening in each posterior maxillary antrum. Other visualized paranasal sinuses are clear. Orbits appear symmetric bilaterally. Other: Mastoid air cells clear. CT CERVICAL SPINE FINDINGS Alignment: There is just under 4 mm of anterolisthesis of C3 on C4, not present on prior study. There is 3 mm of anterolisthesis of C4 on C5, present on prior study. Skull base and vertebrae: There is a moderate pannus posterior to the odontoid which is not causing appreciable impression on the craniocervical junction. There is bony hypertrophy along the superior odontoid, unchanged, not causing impression on the craniocervical junction. There is cystic change in the odontoid, stable. There is no acute fracture. There is no well-defined blastic or lytic bone lesion. In comparison with the previous study, there is irregularity and erosive change involving the inferior endplate of the C3 vertebral body. This appearance may be indicative of a degree of discitis. This  appearance represents a significant change from prior study. Soft tissues and spinal canal: Prevertebral soft tissues and predental space regions are normal. No paraspinous lesions are noted. There is no cord edema or consolidation. There is again noted a bony fragment posterior to C3-4 toward the left which is impressing on the  cord from a ventral approach causing moderately severe spinal stenosis focally. This finding was present on prior study and stable. Disc levels: There is marked disc space narrowing with partial ankylosis at C5-6 and C6-7, also present on previous study. There is moderate disc space narrowing at C3-4 with concern for discitis involving the inferior C3 endplate as noted above. There is multifocal exit foraminal narrowing due to bony hypertrophy. No frank disc extrusion. Spinal stenosis at C3-4 due to the previously noted bony fragment is a stable finding. There is nuchal ligament calcification posterior to C5. Upper chest: Visualized upper lung zones are clear. There is aortic atherosclerosis. There is calcification in the left and right subclavian arteries. Other: There is bilateral carotid artery calcification. IMPRESSION: CT head: Atrophy with patchy periventricular small vessel disease. No intracranial mass hemorrhage, or extra-axial fluid collection. No acute infarct evident. There are foci of arterial vascular calcification. Areas of paranasal sinus disease noted. CT cervical spine: 1.  No acute fracture is evident. 2. Areas of spondylolisthesis at C3-4 and C4-5. The spondylolisthesis at C3-4 was not present previously. 3. Irregularity of the endplate of the C3 vertebral body inferiorly. This appearance is indicative of either erosive arthropathy or possible discitis. Given concern for potential degree of discitis, cervical MRI pre and post-contrast is felt to be warranted. This study could be performed nonemergently but is warranted. 4. Multilevel arthropathy. Disc space narrowing is  most marked at C5-6 and C6-7. There is ankylosis at C5-6. 5. Persistent bony fragment within the canal at the C3-4 level on the left causing impression on the cord and fairly severe stenosis. MR could be helpful for further delineation of the cord in this area. 6. Aortic atherosclerosis. Vascular calcifications at multiple levels including foci of carotid artery calcification bilaterally. Aortic Atherosclerosis (ICD10-I70.0). Electronically Signed   By: Lowella Grip III M.D.   On: 03/18/2017 10:31   Ct Hip Left Wo Contrast  Result Date: 03/18/2017 CLINICAL DATA:  Left hip pain after fall. EXAM: CT OF THE LEFT HIP WITHOUT CONTRAST TECHNIQUE: Multidetector CT imaging of the left hip was performed according to the standard protocol. Multiplanar CT image reconstructions were also generated. COMPARISON:  CT abdomen and pelvis dated May 04, 2015. FINDINGS: Bones/Joint/Cartilage There is a minimally comminuted, predominantly subcapital fracture of the left femoral neck with slight apex anterior and angulation and approximately 1.6 cm of impaction. No additional fractures identified. Mild degenerative changes of the left hip joint and left sacroiliac joint. Diffuse osteopenia. Ligaments Suboptimally assessed by CT. Muscles and Tendons No focal abnormality. Soft tissues Markedly enlarged prostate with central gland hypertrophy. Sigmoid diverticulosis. Atherosclerotic vascular calcifications. Small fat containing left inguinal hernia. IMPRESSION: 1. Minimally comminuted, predominantly subcapital fracture of the left femoral neck as described above. Electronically Signed   By: Titus Dubin M.D.   On: 03/18/2017 10:34    Procedures Procedures (including critical care time)  Medications Ordered in ED Medications  HYDROmorphone (DILAUDID) injection 0.5 mg (0.5 mg Intravenous Given 03/18/17 0926)  HYDROmorphone (DILAUDID) injection 1 mg (1 mg Intravenous Given 03/18/17 1125)     Initial Impression /  Assessment and Plan / ED Course  I have reviewed the triage vital signs and the nursing notes.  Pertinent labs & imaging results that were available during my care of the patient were reviewed by me and considered in my medical decision making (see chart for details).     X-ray shows subcapital fracture left hip.patient has significant coronary artery disease and it was  felt that he could not have his surgery at [facility].   Dr. Stann Mainland at [facility] along with the hospitalist  Final Clinical Impressions(s) / ED Diagnoses   Final diagnoses:  Closed fracture of left hip, initial encounter [facility])    New Prescriptions New Prescriptions   No medications on file     Milton Ferguson, MD 03/18/17 1312

## 2017-03-18 NOTE — ED Triage Notes (Signed)
Pt reports is a resident at [facility] at [place], and says his bedroom shoes got tangled and he fell this morning.  Pt c/o pain to left hip.  Left leg appears shorter and externally rotated while laying in the bed.  Pedal pulse present.

## 2017-03-18 NOTE — ED Notes (Signed)
Hassan Rowan RN (charge) spoke with bed placement who advised that pt would not be getting a bed tongiht

## 2017-03-18 NOTE — Progress Notes (Signed)
Ortho Note  I have been asked to assume care from an orthopedic standpoint for this patient.  He was determined to be too medically complicated by the team at [facility].  He will be transferred down to [facility] from there today and admitted to the hospitalist's service for medical and perioperative care.  Due to his [place] he will need to be off for at a minimum of 5 days preoperatively.  We will see the patient on his arrival here and plan for surgery tentatively on Monday with my partner Dr. Lyla Glassing, who is aware.   Jeffrey [name]

## 2017-03-18 NOTE — ED Notes (Signed)
Pt bed linen changed due to incontinence of urine, pt repositioned, +pedal pulse noted,

## 2017-03-18 NOTE — ED Notes (Signed)
Pt updated on plan of care and delay in obtaining a bed at [facility],

## 2017-03-18 NOTE — Progress Notes (Signed)
Patient ID: Jeffrey [name], male   DOB: [birth year], 83 y.o.   MRN: [id number]    Based on the medical history listed below I am recommending transfer to a higher level of care   Past Medical History:  Diagnosis Date  . Anxiety about health    "multiple somatic complaints"  . Benign prostatic hypertrophy    History of urinary retention  . Cervical vertebral fracture ([place])   . CHF (congestive heart failure) ([place])   . Chronic back pain   . Chronic chest pain   . Coronary atherosclerosis of native coronary artery    a. CABG x 4 in 1989 (VG->OM1->OM2, VG->RCA, LIMA->LAD), b. 05/2010: DES to VG-OM1/OM2, DES to distal LCx. c. NSTEMI in 04/2011 - TO distal LCX stent and VG->OM2. d. 02/2012 NSTEMI DES to VG-OM1/continuation to OM2 occluded. e. inferior STEMI s/p DES to SVG-RAMUS 06/2012. f. inferolat STEMI 09/2012 s/p DES to SVG-interm; g. Lex MV (11/14):  EF 35%, inf-lat scar with small peri-infarct ischemia  . Diabetes mellitus, type II ([place])   . Dizziness and giddiness   . Dysphagia   . Essential hypertension, benign   . Gastroesophageal reflux disease   . GERD (gastroesophageal reflux disease)   . History of pneumonia   . History of stroke   . Hyperlipidemia   . Hyperlipidemia   . Hypothyroidism   . Ischemic cardiomyopathy Nov 2015   EF 35% cath, 45-50% by echo  . MI, acute, non ST segment elevation ([place])   . Osteoarthritis   . Peptic ulcer   . Peptic ulcer disease   . ST elevation (STEMI) myocardial infarction ([place])   . Stroke ([place])   . Syncope and collapse     05/2014 cath: LVEF 35%, occluded LAD, occluded LCX, occluded RCA. Patent LIMA-LAD, occluded SVG to OM1/OM2, occluded SVG-RCA. Patient with collaterals to RCA and OM territories.     Echo 05/27/15 LVEF 45-50% and grade I diastolic dysfunction.   Nuclear stress testing in November 2014 showed an inferolateral defect with a small amount of peri-infarct ischemia. He does not wish to undergo stress testing again.   He resides  in a nursing home.   He denies chest pain and shortness of breath. He seldom has indigestion. He is occasionally dizzy and takes meclizine which helps.  Left hip fracture: femoral neck - need bipolar or unipolar

## 2017-03-18 NOTE — ED Notes (Signed)
Received report on pt, update given,

## 2017-03-18 NOTE — H&P (Signed)
History and Physical    Jeffrey [name] ZOX:[id number] DOB: 10/27/1928 DOA: 03/18/2017  PCP: Jani Gravel, MD  Patient coming from: home  I have personally briefly reviewed patient's old medical records in [place]  Chief Complaint: fall  HPI: Jeffrey [name] is a 83 y.o. male with medical history significant of coronary artery disease, diabetes, hypothyroidism, is brought to the hospital today after suffering a fall. Patient describes a mechanical fall earlier today when he was trying to get out of bed. Denies any dizziness, syncope, chest pain, shortness of breath. He had pain in his left hip. Was unable to stand. He was brought to the emergency room for evaluation where he was found to have a left-sided femoral neck fracture. Patient reports that he is otherwise been in his usual state of health. He has not had any chest pain recently. His respiratory status appears to be at baseline. Bowel movements have been normal. He's been eating and drinking as he normally does. No fevers or other infections.  ED Course: Patient was found to have a left-sided femoral neck fracture. He was evaluated by Dr. Aline Brochure who felt that patient was to medically complex to undergo further treatment in the hospital. It was recommended that he be transferred to [facility] for further care.  Review of Systems: As per HPI otherwise 10 point review of systems negative.    Past Medical History:  Diagnosis Date  . Anxiety about health    "multiple somatic complaints"  . Benign prostatic hypertrophy    History of urinary retention  . Cervical vertebral fracture ([place])   . CHF (congestive heart failure) ([place])   . Chronic back pain   . Chronic chest pain   . Coronary atherosclerosis of native coronary artery    a. CABG x 4 in 1989 (VG->OM1->OM2, VG->RCA, LIMA->LAD), b. 05/2010: DES to VG-OM1/OM2, DES to distal LCx. c. NSTEMI in 04/2011 - TO distal LCX stent and VG->OM2. d. 02/2012 NSTEMI DES to  VG-OM1/continuation to OM2 occluded. e. inferior STEMI s/p DES to SVG-RAMUS 06/2012. f. inferolat STEMI 09/2012 s/p DES to SVG-interm; g. Lex MV (11/14):  EF 35%, inf-lat scar with small peri-infarct ischemia  . Diabetes mellitus, type II ([place])   . Dizziness and giddiness   . Dysphagia   . Essential hypertension, benign   . Gastroesophageal reflux disease   . GERD (gastroesophageal reflux disease)   . History of pneumonia   . History of stroke   . Hyperlipidemia   . Hyperlipidemia   . Hypothyroidism   . Ischemic cardiomyopathy Nov 2015   EF 35% cath, 45-50% by echo  . MI, acute, non ST segment elevation ([place])   . Osteoarthritis   . Peptic ulcer   . Peptic ulcer disease   . ST elevation (STEMI) myocardial infarction ([place])   . Stroke ([place])   . Syncope and collapse     Past Surgical History:  Procedure Laterality Date  . CARDIAC CATHETERIZATION  05/19/14   SVG-OM occl- medical Rx  . CORONARY ANGIOPLASTY  10/12, 8/13, 12/13, 3/14   SVG-OM PCI  . CORONARY ARTERY BYPASS GRAFT  1989  . EGD WITH FOREIGN BODY REMOVAL  08/13/2009   Food impaction  . LEFT HEART CATHETERIZATION WITH CORONARY ANGIOGRAM N/A 11/01/2011   Procedure: LEFT HEART CATHETERIZATION WITH CORONARY ANGIOGRAM;  Surgeon: Lorretta Harp, MD;  Location: [facility] CATH LAB;  Service: Cardiovascular;  Laterality: N/A;  . LEFT HEART CATHETERIZATION WITH CORONARY ANGIOGRAM N/A 06/18/2012  Procedure: LEFT HEART CATHETERIZATION WITH CORONARY ANGIOGRAM;  Surgeon: Peter M Martinique, MD;  Location: [facility] CATH LAB;  Service: Cardiovascular;  Laterality: N/A;  . LEFT HEART CATHETERIZATION WITH CORONARY/GRAFT ANGIOGRAM N/A 03/10/2012   Procedure: LEFT HEART CATHETERIZATION WITH [name];  Surgeon: Sherren Mocha, MD;  Location: [facility] CATH LAB;  Service: Cardiovascular;  Laterality: N/A;  . LEFT HEART CATHETERIZATION WITH CORONARY/GRAFT ANGIOGRAM  10/06/2012   Procedure: LEFT HEART CATHETERIZATION WITH [name];  Surgeon:  Burnell Blanks, MD;  Location: [facility] CATH LAB;  Service: Cardiovascular;;  . LEFT HEART CATHETERIZATION WITH CORONARY/GRAFT ANGIOGRAM N/A 11/09/2012   Procedure: LEFT HEART CATHETERIZATION WITH [name];  Surgeon: Peter M Martinique, MD;  Location: [facility] CATH LAB;  Service: Cardiovascular;  Laterality: N/A;  . LEFT HEART CATHETERIZATION WITH CORONARY/GRAFT ANGIOGRAM N/A 05/19/2014   Procedure: LEFT HEART CATHETERIZATION WITH [name];  Surgeon: Troy Sine, MD;  Location: [facility] CATH LAB;  Service: Cardiovascular;  Laterality: N/A;  . PERCUTANEOUS CORONARY STENT INTERVENTION (PCI-S) N/A 11/01/2011   Procedure: PERCUTANEOUS CORONARY STENT INTERVENTION (PCI-S);  Surgeon: Lorretta Harp, MD;  Location: [facility] CATH LAB;  Service: Cardiovascular;  Laterality: N/A;  . PERCUTANEOUS CORONARY STENT INTERVENTION (PCI-S)  06/18/2012   Procedure: PERCUTANEOUS CORONARY STENT INTERVENTION (PCI-S);  Surgeon: Peter M Martinique, MD;  Location: [facility] CATH LAB;  Service: Cardiovascular;;  . PERCUTANEOUS CORONARY STENT INTERVENTION (PCI-S)  10/06/2012   Procedure: PERCUTANEOUS CORONARY STENT INTERVENTION (PCI-S);  Surgeon: Burnell Blanks, MD;  Location: [facility] CATH LAB;  Service: Cardiovascular;;  . TONSILLECTOMY       reports that he quit smoking about 55 years ago. His smoking use included Cigarettes. He started smoking about 66 years ago. He has a 20.00 pack-year smoking history. He quit smokeless tobacco use about 14 months ago. His smokeless tobacco use included Chew. He reports that he does not drink alcohol or use drugs.  No Known Allergies  Family History  Problem Relation Age of Onset  . Appendicitis Mother        Pt was 38 year old  . Heart attack Father 32  . Early death Unknown        Parents died young  . Colon cancer Neg Hx     Prior to Admission medications   Medication Sig Start Date End Date Taking? Authorizing Provider  acetaminophen (TYLENOL) 325 MG tablet Take 2  tablets (650 mg total) by mouth every 4 (four) hours as needed for headache or mild pain. 05/21/14  Yes Kilroy, Luke K, PA-C  albuterol (PROVENTIL HFA;VENTOLIN HFA) 108 (90 BASE) MCG/ACT inhaler Inhale 2 puffs into the lungs every 6 (six) hours as needed for wheezing or shortness of breath.   Yes [provider]  ALPRAZolam (XANAX) 0.25 MG tablet Take 0.25 mg by mouth 2 (two) times daily.   Yes [provider]  aluminum-magnesium hydroxide-simethicone (MAALOX) 200-200-20 MG/5ML SUSP Take 15 mLs by mouth every 6 (six) hours as needed (indgestion).   Yes [provider]  aspirin EC 81 MG tablet Take 81 mg by mouth daily.   Yes [provider]  atorvastatin (LIPITOR) 40 MG tablet Take 1 tablet (40 mg total) by mouth daily at 6 PM. 05/29/13  Yes Weaver, Scott T, PA-C  finasteride (PROSCAR) 5 MG tablet Take 5 mg by mouth daily.   Yes [provider]  fludrocortisone (FLORINEF) 0.1 MG tablet Take 0.1 mg by mouth daily.   Yes [provider]  Hexylresorcinol (THROAT LOZENGES MT) Use as directed 1  lozenge in the mouth or throat every 4 (four) hours as needed (sore throat).   Yes [provider]  insulin glargine (LANTUS) 100 unit/mL SOPN Inject 10 Units into the skin at bedtime.   Yes [provider]  insulin lispro (HUMALOG) 100 UNIT/ML KwikPen Junior Inject 2-12 Units into the skin 4 (four) times daily. 151-200= 2 units; 201-250= 4 units; 251-300= 6 units; 301-350 = 8 units; 351-400= 10 units; >401 = 12 units and call MD.   Yes [provider]  levothyroxine (SYNTHROID, LEVOTHROID) 50 MCG tablet Take 50 mcg by mouth daily before breakfast. Reported on 10/10/2015 09/08/14  Yes [provider]  loperamide (IMODIUM A-D) 2 MG tablet Take 2 mg by mouth every 6 (six) hours as needed for diarrhea or loose stools.   Yes [provider]  meclizine (ANTIVERT) 25 MG tablet Take 25 mg by mouth every 8 (eight) hours as needed  for dizziness.   Yes [provider]  Multiple Vitamin (MULTIVITAMIN WITH MINERALS) TABS Take 1 tablet by mouth daily. Reported on 10/10/2015   Yes [provider]  NITROSTAT 0.4 MG SL tablet PLACE 1 TAB UNDER TONGUE EVERY 5 MIN IF NEEDED FOR CHEST PAIN. MAY USE 3 TIMES.NO RELIEF CALL 911. 07/04/15  Yes Lendon Colonel, NP  pantoprazole (PROTONIX) 40 MG tablet Take 40 mg by mouth daily.   Yes [provider]  PARoxetine (PAXIL) 20 MG tablet Take 20 mg by mouth daily.   Yes [provider]  pioglitazone (ACTOS) 15 MG tablet Take 15 mg by mouth daily.   Yes [provider]  polyethylene glycol powder (GLYCOLAX/MIRALAX) powder Take 1 Container by mouth daily as needed for moderate constipation.    Yes [provider]  potassium chloride SA (K-DUR,KLOR-CON) 20 MEQ tablet Take 40 mEq by mouth 2 (two) times daily.   Yes [provider]  tamsulosin (FLOMAX) 0.4 MG CAPS Take 0.4 mg by mouth daily.  01/27/13  Yes [provider]  ticagrelor (BRILINTA) 60 MG TABS tablet Take 1 tablet (60 mg total) by mouth 2 (two) times daily. Patient taking differently: Take 60 mg by mouth daily.  08/18/16  Yes Herminio Commons, MD    Physical Exam: Vitals:   03/18/17 0730 03/18/17 0731 03/18/17 0732 03/18/17 0800  BP:   (!) 143/78 (!) 162/82  Pulse: 77   77  Resp: 20     Temp: 97.7 F (36.5 C)     TempSrc: Oral     SpO2: 93%   99%  Weight:  80.7 kg (178 lb)    Height:  5\' 10"  (1.778 m)      Constitutional: NAD, calm, comfortable Vitals:   03/18/17 0730 03/18/17 0731 03/18/17 0732 03/18/17 0800  BP:   (!) 143/78 (!) 162/82  Pulse: 77   77  Resp: 20     Temp: 97.7 F (36.5 C)     TempSrc: Oral     SpO2: 93%   99%  Weight:  80.7 kg (178 lb)    Height:  5\' 10"  (1.778 m)     Eyes: PERRL, lids and conjunctivae normal ENMT: Mucous membranes are moist. Posterior pharynx clear of any exudate or lesions.Normal dentition.  Neck:  normal, supple, no masses, no thyromegaly Respiratory: clear to auscultation bilaterally, no wheezing, no crackles. Normal respiratory effort. No accessory muscle use.  Cardiovascular: Regular rate and rhythm, no murmurs / rubs / gallops. No extremity edema. 2+ pedal pulses. No carotid bruits.  Abdomen:  no tenderness, no masses palpated. No hepatosplenomegaly. Bowel sounds positive.  Musculoskeletal: no clubbing / cyanosis. LLE is shortened and externally rotated. Good ROM, no contractures. Normal muscle tone.  Skin: no rashes, lesions, ulcers. No induration Neurologic: CN 2-12 grossly intact. Sensation intact, DTR normal. Strength 5/5 in all 4.  Psychiatric: Normal judgment and insight. Alert and oriented x 3. Normal mood.    Labs on Admission: I have personally reviewed following labs and imaging studies  CBC:  Recent Labs Lab 03/18/17 0808  WBC 6.3  NEUTROABS 4.7  HGB 13.0  HCT 38.9*  MCV 94.9  PLT 564*   Basic Metabolic Panel:  Recent Labs Lab 03/18/17 0808  NA 136  K 3.9  CL 103  CO2 24  GLUCOSE 151*  BUN 15  CREATININE 0.85  CALCIUM 9.3   GFR: Estimated Creatinine Clearance: 64.4 mL/min (by C-G formula based on SCr of 0.85 mg/dL). Liver Function Tests:  Recent Labs Lab 03/18/17 0808  AST 22  ALT 18  ALKPHOS 110  BILITOT 0.5  PROT 6.6  ALBUMIN 3.8   No results for input(s): LIPASE, AMYLASE in the last 168 hours. No results for input(s): AMMONIA in the last 168 hours. Coagulation Profile: No results for input(s): INR, PROTIME in the last 168 hours. Cardiac Enzymes: No results for input(s): CKTOTAL, CKMB, CKMBINDEX, TROPONINI in the last 168 hours. BNP (last 3 results) No results for input(s): PROBNP in the last 8760 hours. HbA1C: No results for input(s): HGBA1C in the last 72 hours. CBG: No results for input(s): GLUCAP in the last 168 hours. Lipid Profile: No results for input(s): CHOL, HDL, LDLCALC, TRIG, CHOLHDL, LDLDIRECT in the last 72  hours. Thyroid Function Tests: No results for input(s): TSH, T4TOTAL, FREET4, T3FREE, THYROIDAB in the last 72 hours. Anemia Panel: No results for input(s): VITAMINB12, FOLATE, FERRITIN, TIBC, IRON, RETICCTPCT in the last 72 hours. Urine analysis:    Component Value Date/Time   COLORURINE YELLOW 06/22/2015 0850   APPEARANCEUR CLEAR 06/22/2015 0850   LABSPEC 1.010 06/22/2015 0850   PHURINE 7.0 06/22/2015 0850   GLUCOSEU 100 (A) 06/22/2015 0850   HGBUR NEGATIVE 06/22/2015 0850   BILIRUBINUR NEGATIVE 06/22/2015 0850   KETONESUR TRACE (A) 06/22/2015 0850   PROTEINUR NEGATIVE 06/22/2015 0850   UROBILINOGEN 0.2 05/19/2015 1449   NITRITE NEGATIVE 06/22/2015 0850   LEUKOCYTESUR NEGATIVE 06/22/2015 0850    Radiological Exams on Admission: Ct Head Wo Contrast  Result Date: 03/18/2017 CLINICAL DATA:  Pain following fall EXAM: CT HEAD WITHOUT CONTRAST CT CERVICAL SPINE WITHOUT CONTRAST TECHNIQUE: Multidetector CT imaging of the head and cervical spine was performed following the standard protocol without intravenous contrast. Multiplanar CT image reconstructions of the cervical spine were also generated. COMPARISON:  Head CT March 21, 2012; cervical spine CT April 07, 2010 FINDINGS: CT HEAD FINDINGS Brain: There is moderate diffuse atrophy. There is no intracranial mass, hemorrhage, extra-axial fluid collection, or midline shift. There is mild patchy small vessel disease in the centra semiovale bilaterally. Elsewhere gray-white compartments appear normal. No evident acute infarct. Note that a prior focal infarct in the right pons is less well seen at this time. Vascular: No hyperdense vessel. There is calcification in each carotid siphon region. There is also calcification in each distal vertebral artery. Skull: Bony calvarium appears intact. Sinuses/Orbits: There is mucosal thickening in multiple ethmoid air cells bilaterally. There is slight mucosal thickening in the inferior right frontal  sinus. There is slight mucosal thickening in each posterior maxillary antrum. Other visualized  paranasal sinuses are clear. Orbits appear symmetric bilaterally. Other: Mastoid air cells clear. CT CERVICAL SPINE FINDINGS Alignment: There is just under 4 mm of anterolisthesis of C3 on C4, not present on prior study. There is 3 mm of anterolisthesis of C4 on C5, present on prior study. Skull base and vertebrae: There is a moderate pannus posterior to the odontoid which is not causing appreciable impression on the craniocervical junction. There is bony hypertrophy along the superior odontoid, unchanged, not causing impression on the craniocervical junction. There is cystic change in the odontoid, stable. There is no acute fracture. There is no well-defined blastic or lytic bone lesion. In comparison with the previous study, there is irregularity and erosive change involving the inferior endplate of the C3 vertebral body. This appearance may be indicative of a degree of discitis. This appearance represents a significant change from prior study. Soft tissues and spinal canal: Prevertebral soft tissues and predental space regions are normal. No paraspinous lesions are noted. There is no cord edema or consolidation. There is again noted a bony fragment posterior to C3-4 toward the left which is impressing on the cord from a ventral approach causing moderately severe spinal stenosis focally. This finding was present on prior study and stable. Disc levels: There is marked disc space narrowing with partial ankylosis at C5-6 and C6-7, also present on previous study. There is moderate disc space narrowing at C3-4 with concern for discitis involving the inferior C3 endplate as noted above. There is multifocal exit foraminal narrowing due to bony hypertrophy. No frank disc extrusion. Spinal stenosis at C3-4 due to the previously noted bony fragment is a stable finding. There is nuchal ligament calcification posterior to C5. Upper  chest: Visualized upper lung zones are clear. There is aortic atherosclerosis. There is calcification in the left and right subclavian arteries. Other: There is bilateral carotid artery calcification. IMPRESSION: CT head: Atrophy with patchy periventricular small vessel disease. No intracranial mass hemorrhage, or extra-axial fluid collection. No acute infarct evident. There are foci of arterial vascular calcification. Areas of paranasal sinus disease noted. CT cervical spine: 1.  No acute fracture is evident. 2. Areas of spondylolisthesis at C3-4 and C4-5. The spondylolisthesis at C3-4 was not present previously. 3. Irregularity of the endplate of the C3 vertebral body inferiorly. This appearance is indicative of either erosive arthropathy or possible discitis. Given concern for potential degree of discitis, cervical MRI pre and post-contrast is felt to be warranted. This study could be performed nonemergently but is warranted. 4. Multilevel arthropathy. Disc space narrowing is most marked at C5-6 and C6-7. There is ankylosis at C5-6. 5. Persistent bony fragment within the canal at the C3-4 level on the left causing impression on the cord and fairly severe stenosis. MR could be helpful for further delineation of the cord in this area. 6. Aortic atherosclerosis. Vascular calcifications at multiple levels including foci of carotid artery calcification bilaterally. Aortic Atherosclerosis (ICD10-I70.0). Electronically Signed   By: Lowella Grip III M.D.   On: 03/18/2017 10:31   Ct Cervical Spine Wo Contrast  Result Date: 03/18/2017 CLINICAL DATA:  Pain following fall EXAM: CT HEAD WITHOUT CONTRAST CT CERVICAL SPINE WITHOUT CONTRAST TECHNIQUE: Multidetector CT imaging of the head and cervical spine was performed following the standard protocol without intravenous contrast. Multiplanar CT image reconstructions of the cervical spine were also generated. COMPARISON:  Head CT March 21, 2012; cervical spine CT  April 07, 2010 FINDINGS: CT HEAD FINDINGS Brain: There is moderate diffuse atrophy. There is  no intracranial mass, hemorrhage, extra-axial fluid collection, or midline shift. There is mild patchy small vessel disease in the centra semiovale bilaterally. Elsewhere gray-white compartments appear normal. No evident acute infarct. Note that a prior focal infarct in the right pons is less well seen at this time. Vascular: No hyperdense vessel. There is calcification in each carotid siphon region. There is also calcification in each distal vertebral artery. Skull: Bony calvarium appears intact. Sinuses/Orbits: There is mucosal thickening in multiple ethmoid air cells bilaterally. There is slight mucosal thickening in the inferior right frontal sinus. There is slight mucosal thickening in each posterior maxillary antrum. Other visualized paranasal sinuses are clear. Orbits appear symmetric bilaterally. Other: Mastoid air cells clear. CT CERVICAL SPINE FINDINGS Alignment: There is just under 4 mm of anterolisthesis of C3 on C4, not present on prior study. There is 3 mm of anterolisthesis of C4 on C5, present on prior study. Skull base and vertebrae: There is a moderate pannus posterior to the odontoid which is not causing appreciable impression on the craniocervical junction. There is bony hypertrophy along the superior odontoid, unchanged, not causing impression on the craniocervical junction. There is cystic change in the odontoid, stable. There is no acute fracture. There is no well-defined blastic or lytic bone lesion. In comparison with the previous study, there is irregularity and erosive change involving the inferior endplate of the C3 vertebral body. This appearance may be indicative of a degree of discitis. This appearance represents a significant change from prior study. Soft tissues and spinal canal: Prevertebral soft tissues and predental space regions are normal. No paraspinous lesions are noted. There is  no cord edema or consolidation. There is again noted a bony fragment posterior to C3-4 toward the left which is impressing on the cord from a ventral approach causing moderately severe spinal stenosis focally. This finding was present on prior study and stable. Disc levels: There is marked disc space narrowing with partial ankylosis at C5-6 and C6-7, also present on previous study. There is moderate disc space narrowing at C3-4 with concern for discitis involving the inferior C3 endplate as noted above. There is multifocal exit foraminal narrowing due to bony hypertrophy. No frank disc extrusion. Spinal stenosis at C3-4 due to the previously noted bony fragment is a stable finding. There is nuchal ligament calcification posterior to C5. Upper chest: Visualized upper lung zones are clear. There is aortic atherosclerosis. There is calcification in the left and right subclavian arteries. Other: There is bilateral carotid artery calcification. IMPRESSION: CT head: Atrophy with patchy periventricular small vessel disease. No intracranial mass hemorrhage, or extra-axial fluid collection. No acute infarct evident. There are foci of arterial vascular calcification. Areas of paranasal sinus disease noted. CT cervical spine: 1.  No acute fracture is evident. 2. Areas of spondylolisthesis at C3-4 and C4-5. The spondylolisthesis at C3-4 was not present previously. 3. Irregularity of the endplate of the C3 vertebral body inferiorly. This appearance is indicative of either erosive arthropathy or possible discitis. Given concern for potential degree of discitis, cervical MRI pre and post-contrast is felt to be warranted. This study could be performed nonemergently but is warranted. 4. Multilevel arthropathy. Disc space narrowing is most marked at C5-6 and C6-7. There is ankylosis at C5-6. 5. Persistent bony fragment within the canal at the C3-4 level on the left causing impression on the cord and fairly severe stenosis. MR could  be helpful for further delineation of the cord in this area. 6. Aortic atherosclerosis. Vascular calcifications at multiple  levels including foci of carotid artery calcification bilaterally. Aortic Atherosclerosis (ICD10-I70.0). Electronically Signed   By: Lowella Grip III M.D.   On: 03/18/2017 10:31   Ct Hip Left Wo Contrast  Result Date: 03/18/2017 CLINICAL DATA:  Left hip pain after fall. EXAM: CT OF THE LEFT HIP WITHOUT CONTRAST TECHNIQUE: Multidetector CT imaging of the left hip was performed according to the standard protocol. Multiplanar CT image reconstructions were also generated. COMPARISON:  CT abdomen and pelvis dated May 04, 2015. FINDINGS: Bones/Joint/Cartilage There is a minimally comminuted, predominantly subcapital fracture of the left femoral neck with slight apex anterior and angulation and approximately 1.6 cm of impaction. No additional fractures identified. Mild degenerative changes of the left hip joint and left sacroiliac joint. Diffuse osteopenia. Ligaments Suboptimally assessed by CT. Muscles and Tendons No focal abnormality. Soft tissues Markedly enlarged prostate with central gland hypertrophy. Sigmoid diverticulosis. Atherosclerotic vascular calcifications. Small fat containing left inguinal hernia. IMPRESSION: 1. Minimally comminuted, predominantly subcapital fracture of the left femoral neck as described above. Electronically Signed   By: Titus Dubin M.D.   On: 03/18/2017 10:34   Dg Hip Port Unilat With Pelvis 1v Left  Result Date: 03/18/2017 CLINICAL DATA:  Left hip fracture EXAM: DG HIP (WITH OR WITHOUT PELVIS) 1V PORT LEFT COMPARISON:  None. FINDINGS: Fracture of the left femoral neck with mild impaction and displacement. No other acute fracture Mild degenerative change in both hip joints. IMPRESSION: Mildly displaced left femoral neck fracture. Electronically Signed   By: Franchot Gallo M.D.   On: 03/18/2017 13:09     Assessment/Plan Active Problems:    Hyperlipidemia   Arteriosclerotic cardiovascular disease (ASCVD)   Essential hypertension   GERD (gastroesophageal reflux disease)   Hypothyroidism   DM type 2 causing vascular disease (HCC)   Left displaced femoral neck fracture (HCC)   Cervical spinal stenosis   1. Left femoral neck fracture. Suffered after patient had a mechanical fall. He did not have any concerning symptoms prior to fall. Patient evaluated by Dr. Aline Brochure who felt that patient was to medically complex to undergo surgery at [facility]. Dr. Stann Mainland, orthopedics at [facility] has been made aware of the patient and agrees to see him in consult at [facility]. Since the patient is taking Brillinta for CAD, this will need to be held for at least 5 days prior to surgery. 2. Coronary artery disease. Patient followed up with his cardiologist on 9/4 where it was noted that his coronary artery disease symptomatically stable. He'll be continued on aspirin, Coreg, Lipitor. Will hold Brillinta for now in light of upcoming surgery. 3. Hyperlipidemia. Continue Lipitor 4. Hypertension. Continue Coreg 5. Diabetes. Continue on Lantus. Supplement with sliding scale insulin. 6. Hypothyroidism. Continue on Synthroid 7. Cervical spinal stenosis. Noted on CT imaging of C-spine. He does not appear to have any significant neck pain or focal weakness at this time. CT indicates possible discitis and cervical spine. He does not have any fever or leukocytosis. This can be further evaluated, non-emergently with MRI of the C-spine. 8. GERD. Continue supportive management  DVT prophylaxis: lovenox Code Status: full code Family Communication: no family present Disposition Plan: transfer to [facility] for further ortho evaluation Consults called: orthopedics, [facility] Admission status: inpatient, Dollene Cleveland MD [facility] Pager [phone]  If 7PM-7AM, please contact night-coverage [email or web address] Password [email or web address]  03/18/2017, 3:22  PM

## 2017-03-19 ENCOUNTER — Encounter (HOSPITAL_COMMUNITY): Payer: Self-pay | Admitting: Emergency Medicine

## 2017-03-19 LAB — CBG MONITORING, ED
Glucose-Capillary: 130 mg/dL — ABNORMAL HIGH (ref 65–99)
Glucose-Capillary: 139 mg/dL — ABNORMAL HIGH (ref 65–99)
Glucose-Capillary: 132 mg/dL — ABNORMAL HIGH (ref 65–99)

## 2017-03-19 LAB — BASIC METABOLIC PANEL
ANION GAP: 7 (ref 5–15)
BUN: 18 mg/dL (ref 6–20)
CALCIUM: 8.8 mg/dL — AB (ref 8.9–10.3)
CO2: 24 mmol/L (ref 22–32)
CREATININE: 0.91 mg/dL (ref 0.61–1.24)
Chloride: 105 mmol/L (ref 101–111)
GLUCOSE: 142 mg/dL — AB (ref 65–99)
Potassium: 4 mmol/L (ref 3.5–5.1)
Sodium: 136 mmol/L (ref 135–145)

## 2017-03-19 LAB — CBC
HEMATOCRIT: 38.5 % — AB (ref 39.0–52.0)
Hemoglobin: 13 g/dL (ref 13.0–17.0)
MCH: 32.1 pg (ref 26.0–34.0)
MCHC: 33.8 g/dL (ref 30.0–36.0)
MCV: 95.1 fL (ref 78.0–100.0)
PLATELETS: 146 10*3/uL — AB (ref 150–400)
RBC: 4.05 MIL/uL — ABNORMAL LOW (ref 4.22–5.81)
RDW: 12.8 % (ref 11.5–15.5)
WBC: 9.9 10*3/uL (ref 4.0–10.5)

## 2017-03-19 LAB — GLUCOSE, CAPILLARY: Glucose-Capillary: 170 mg/dL — ABNORMAL HIGH (ref 65–99)

## 2017-03-19 LAB — SURGICAL PCR SCREEN
MRSA, PCR: NEGATIVE
STAPHYLOCOCCUS AUREUS: NEGATIVE

## 2017-03-19 LAB — TROPONIN I
Troponin I: <0.03 ng/mL (ref <0.03)
Troponin I: <0.03 ng/mL (ref <0.03)

## 2017-03-19 MED ORDER — CEFAZOLIN SODIUM-DEXTROSE 2-4 GM/100ML-% IV SOLN
2.0000 g | INTRAVENOUS | Status: AC
  Administered 2017-03-23: 2 g via INTRAVENOUS
  Filled 2017-03-19: qty 100

## 2017-03-19 MED ORDER — POVIDONE-IODINE 10 % EX SWAB: 2.0000 "application " | Freq: Once | CUTANEOUS | Status: DC

## 2017-03-19 MED ORDER — CHLORHEXIDINE GLUCONATE 4 % EX LIQD: 60.0000 mL | Freq: Once | CUTANEOUS | Status: DC

## 2017-03-19 MED ORDER — ACETAMINOPHEN 500 MG PO TABS
1000.0000 mg | ORAL_TABLET | Freq: Once | ORAL | Status: AC
  Administered 2017-03-19: 1000 mg via ORAL
  Filled 2017-03-19: qty 2

## 2017-03-19 NOTE — ED Notes (Signed)
Bed still assigned at this time, but not clicked Ready.

## 2017-03-19 NOTE — Progress Notes (Signed)
PROGRESS NOTE    Jeffrey [name]  WUX:[id number] DOB: 17-Sep-1928 DOA: 03/18/2017 PCP: Jani Gravel, MD     Brief Narrative:  83 year old man admitted to the hospital on 9/5 following a mechanical fall that resulted in a left hip fracture. Evaluated by Dr. Aline Brochure who felt the patient was medically too complex to undergo further treatment at this hospital and was recommended to be transferred to [facility] for further care, however he remains in the [facility] emergency department pending bed availability.   Assessment & Plan:   Active Problems:   Hyperlipidemia   Arteriosclerotic cardiovascular disease (ASCVD)   Essential hypertension   GERD (gastroesophageal reflux disease)   Hypothyroidism   DM type 2 causing vascular disease (HCC)   Left displaced femoral neck fracture (HCC)   Cervical spinal stenosis   Left femoral neck fracture -Following mechanical fall. -Case was discussed by admitting physician with orthopedist at [facility], Dr. Stann Mainland, who has agreed to see patient in consultation upon arrival. -Since he is on Brillinta this will need to be held for at least 5 days prior to surgery.  Coronary artery disease -Stable, no chest pain. -He actually saw his cardiologist on the day prior to presentation on 9/4 where it was noted that he was symptomatically stable. -Plan to continue aspirin, Coreg, Lipitor. Brillinta is on hold for now in light of upcoming surgery.  Hyperlipidemia -Continue Lipitor  Hypertension -Well-controlled, continue Coreg.  Hypothyroidism -Continue Synthroid  Diabetes -Fair control, continue current management.  Cervical spine stenosis -Noted on C-spine CT ordered on admission. -Also on this CT there was noted an irregularity of the C3 vertebral body with concerns for discitis. -However in this patient without fever, white count, weakness, I feel this can be evaluated non-emergently with a C-spine MRI after correction of his acute fracture has  been completed.   DVT prophylaxis: Lovenox Code Status: Full code Family Communication: Patient only Disposition Plan: To [facility] once bed available  Consultants:   Orthopedics, Dr. Stann Mainland  Procedures:   None  Antimicrobials:  Anti-infectives    None       Subjective: Feels well, minimal pain of his left hip.  Objective: Vitals:   03/19/17 0230 03/19/17 0720 03/19/17 0918 03/19/17 1220  BP: 137/78 131/82 137/69 (!) 120/58  Pulse:  83 84 80  Resp: 15 16 16 17   Temp:  98.3 F (36.8 C)    TempSrc:  Oral    SpO2:  98% 96% 98%  Weight:      Height:       No intake or output data in the 24 hours ending 03/19/17 1449 Filed Weights   03/18/17 0731  Weight: 80.7 kg (178 lb)    Examination:  General exam: Alert, awake, oriented x 3 Respiratory system: Clear to auscultation. Respiratory effort normal. Cardiovascular system:RRR. No murmurs, rubs, gallops. Gastrointestinal system: Abdomen is nondistended, soft and nontender. No organomegaly or masses felt. Normal bowel sounds heard. Central nervous system: Alert and oriented. No focal neurological deficits. Extremities: No C/C/E, +pedal pulses Skin: No rashes, lesions or ulcers Psychiatry: Judgement and insight appear normal. Mood & affect appropriate.     Data Reviewed: I have personally reviewed following labs and imaging studies  CBC:  Recent Labs Lab 03/18/17 0808 03/19/17 0255  WBC 6.3 9.9  NEUTROABS 4.7  --   HGB 13.0 13.0  HCT 38.9* 38.5*  MCV 94.9 95.1  PLT 137* 253*   Basic Metabolic Panel:  Recent Labs Lab 03/18/17  6734 03/19/17 0255  NA 136 136  K 3.9 4.0  CL 103 105  CO2 24 24  GLUCOSE 151* 142*  BUN 15 18  CREATININE 0.85 0.91  CALCIUM 9.3 8.8*   GFR: Estimated Creatinine Clearance: 60.2 mL/min (by C-G formula based on SCr of 0.91 mg/dL). Liver Function Tests:  Recent Labs Lab 03/18/17 0808  AST 22  ALT 18  ALKPHOS 110  BILITOT 0.5  PROT 6.6  ALBUMIN 3.8     No results for input(s): LIPASE, AMYLASE in the last 168 hours. No results for input(s): AMMONIA in the last 168 hours. Coagulation Profile: No results for input(s): INR, PROTIME in the last 168 hours. Cardiac Enzymes:  Recent Labs Lab 03/19/17 0255 03/19/17 0821  TROPONINI <0.03 <0.03   BNP (last 3 results) No results for input(s): PROBNP in the last 8760 hours. HbA1C: No results for input(s): HGBA1C in the last 72 hours. CBG:  Recent Labs Lab 03/18/17 1748 03/18/17 2159 03/19/17 0725 03/19/17 1245  GLUCAP 191* 110* 130* 139*   Lipid Profile: No results for input(s): CHOL, HDL, LDLCALC, TRIG, CHOLHDL, LDLDIRECT in the last 72 hours. Thyroid Function Tests: No results for input(s): TSH, T4TOTAL, FREET4, T3FREE, THYROIDAB in the last 72 hours. Anemia Panel: No results for input(s): VITAMINB12, FOLATE, FERRITIN, TIBC, IRON, RETICCTPCT in the last 72 hours. Urine analysis:    Component Value Date/Time   COLORURINE YELLOW 06/22/2015 0850   APPEARANCEUR CLEAR 06/22/2015 0850   LABSPEC 1.010 06/22/2015 0850   PHURINE 7.0 06/22/2015 0850   GLUCOSEU 100 (A) 06/22/2015 0850   HGBUR NEGATIVE 06/22/2015 0850   BILIRUBINUR NEGATIVE 06/22/2015 0850   KETONESUR TRACE (A) 06/22/2015 0850   PROTEINUR NEGATIVE 06/22/2015 0850   UROBILINOGEN 0.2 05/19/2015 1449   NITRITE NEGATIVE 06/22/2015 0850   LEUKOCYTESUR NEGATIVE 06/22/2015 0850   Sepsis Labs: @LABRCNTIP (procalcitonin:4,lacticidven:4)  )No results found for this or any previous visit (from the past 240 hour(s)).       Radiology Studies: Ct Head Wo Contrast  Result Date: 03/18/2017 CLINICAL DATA:  Pain following fall EXAM: CT HEAD WITHOUT CONTRAST CT CERVICAL SPINE WITHOUT CONTRAST TECHNIQUE: Multidetector CT imaging of the head and cervical spine was performed following the standard protocol without intravenous contrast. Multiplanar CT image reconstructions of the cervical spine were also generated. COMPARISON:   Head CT March 21, 2012; cervical spine CT April 07, 2010 FINDINGS: CT HEAD FINDINGS Brain: There is moderate diffuse atrophy. There is no intracranial mass, hemorrhage, extra-axial fluid collection, or midline shift. There is mild patchy small vessel disease in the centra semiovale bilaterally. Elsewhere gray-white compartments appear normal. No evident acute infarct. Note that a prior focal infarct in the right pons is less well seen at this time. Vascular: No hyperdense vessel. There is calcification in each carotid siphon region. There is also calcification in each distal vertebral artery. Skull: Bony calvarium appears intact. Sinuses/Orbits: There is mucosal thickening in multiple ethmoid air cells bilaterally. There is slight mucosal thickening in the inferior right frontal sinus. There is slight mucosal thickening in each posterior maxillary antrum. Other visualized paranasal sinuses are clear. Orbits appear symmetric bilaterally. Other: Mastoid air cells clear. CT CERVICAL SPINE FINDINGS Alignment: There is just under 4 mm of anterolisthesis of C3 on C4, not present on prior study. There is 3 mm of anterolisthesis of C4 on C5, present on prior study. Skull base and vertebrae: There is a moderate pannus posterior to the odontoid which is not causing appreciable impression on the craniocervical junction.  There is bony hypertrophy along the superior odontoid, unchanged, not causing impression on the craniocervical junction. There is cystic change in the odontoid, stable. There is no acute fracture. There is no well-defined blastic or lytic bone lesion. In comparison with the previous study, there is irregularity and erosive change involving the inferior endplate of the C3 vertebral body. This appearance may be indicative of a degree of discitis. This appearance represents a significant change from prior study. Soft tissues and spinal canal: Prevertebral soft tissues and predental space regions are normal.  No paraspinous lesions are noted. There is no cord edema or consolidation. There is again noted a bony fragment posterior to C3-4 toward the left which is impressing on the cord from a ventral approach causing moderately severe spinal stenosis focally. This finding was present on prior study and stable. Disc levels: There is marked disc space narrowing with partial ankylosis at C5-6 and C6-7, also present on previous study. There is moderate disc space narrowing at C3-4 with concern for discitis involving the inferior C3 endplate as noted above. There is multifocal exit foraminal narrowing due to bony hypertrophy. No frank disc extrusion. Spinal stenosis at C3-4 due to the previously noted bony fragment is a stable finding. There is nuchal ligament calcification posterior to C5. Upper chest: Visualized upper lung zones are clear. There is aortic atherosclerosis. There is calcification in the left and right subclavian arteries. Other: There is bilateral carotid artery calcification. IMPRESSION: CT head: Atrophy with patchy periventricular small vessel disease. No intracranial mass hemorrhage, or extra-axial fluid collection. No acute infarct evident. There are foci of arterial vascular calcification. Areas of paranasal sinus disease noted. CT cervical spine: 1.  No acute fracture is evident. 2. Areas of spondylolisthesis at C3-4 and C4-5. The spondylolisthesis at C3-4 was not present previously. 3. Irregularity of the endplate of the C3 vertebral body inferiorly. This appearance is indicative of either erosive arthropathy or possible discitis. Given concern for potential degree of discitis, cervical MRI pre and post-contrast is felt to be warranted. This study could be performed nonemergently but is warranted. 4. Multilevel arthropathy. Disc space narrowing is most marked at C5-6 and C6-7. There is ankylosis at C5-6. 5. Persistent bony fragment within the canal at the C3-4 level on the left causing impression on the  cord and fairly severe stenosis. MR could be helpful for further delineation of the cord in this area. 6. Aortic atherosclerosis. Vascular calcifications at multiple levels including foci of carotid artery calcification bilaterally. Aortic Atherosclerosis (ICD10-I70.0). Electronically Signed   By: Lowella Grip III M.D.   On: 03/18/2017 10:31   Ct Cervical Spine Wo Contrast  Result Date: 03/18/2017 CLINICAL DATA:  Pain following fall EXAM: CT HEAD WITHOUT CONTRAST CT CERVICAL SPINE WITHOUT CONTRAST TECHNIQUE: Multidetector CT imaging of the head and cervical spine was performed following the standard protocol without intravenous contrast. Multiplanar CT image reconstructions of the cervical spine were also generated. COMPARISON:  Head CT March 21, 2012; cervical spine CT April 07, 2010 FINDINGS: CT HEAD FINDINGS Brain: There is moderate diffuse atrophy. There is no intracranial mass, hemorrhage, extra-axial fluid collection, or midline shift. There is mild patchy small vessel disease in the centra semiovale bilaterally. Elsewhere gray-white compartments appear normal. No evident acute infarct. Note that a prior focal infarct in the right pons is less well seen at this time. Vascular: No hyperdense vessel. There is calcification in each carotid siphon region. There is also calcification in each distal vertebral artery. Skull: Bony calvarium  appears intact. Sinuses/Orbits: There is mucosal thickening in multiple ethmoid air cells bilaterally. There is slight mucosal thickening in the inferior right frontal sinus. There is slight mucosal thickening in each posterior maxillary antrum. Other visualized paranasal sinuses are clear. Orbits appear symmetric bilaterally. Other: Mastoid air cells clear. CT CERVICAL SPINE FINDINGS Alignment: There is just under 4 mm of anterolisthesis of C3 on C4, not present on prior study. There is 3 mm of anterolisthesis of C4 on C5, present on prior study. Skull base and  vertebrae: There is a moderate pannus posterior to the odontoid which is not causing appreciable impression on the craniocervical junction. There is bony hypertrophy along the superior odontoid, unchanged, not causing impression on the craniocervical junction. There is cystic change in the odontoid, stable. There is no acute fracture. There is no well-defined blastic or lytic bone lesion. In comparison with the previous study, there is irregularity and erosive change involving the inferior endplate of the C3 vertebral body. This appearance may be indicative of a degree of discitis. This appearance represents a significant change from prior study. Soft tissues and spinal canal: Prevertebral soft tissues and predental space regions are normal. No paraspinous lesions are noted. There is no cord edema or consolidation. There is again noted a bony fragment posterior to C3-4 toward the left which is impressing on the cord from a ventral approach causing moderately severe spinal stenosis focally. This finding was present on prior study and stable. Disc levels: There is marked disc space narrowing with partial ankylosis at C5-6 and C6-7, also present on previous study. There is moderate disc space narrowing at C3-4 with concern for discitis involving the inferior C3 endplate as noted above. There is multifocal exit foraminal narrowing due to bony hypertrophy. No frank disc extrusion. Spinal stenosis at C3-4 due to the previously noted bony fragment is a stable finding. There is nuchal ligament calcification posterior to C5. Upper chest: Visualized upper lung zones are clear. There is aortic atherosclerosis. There is calcification in the left and right subclavian arteries. Other: There is bilateral carotid artery calcification. IMPRESSION: CT head: Atrophy with patchy periventricular small vessel disease. No intracranial mass hemorrhage, or extra-axial fluid collection. No acute infarct evident. There are foci of arterial  vascular calcification. Areas of paranasal sinus disease noted. CT cervical spine: 1.  No acute fracture is evident. 2. Areas of spondylolisthesis at C3-4 and C4-5. The spondylolisthesis at C3-4 was not present previously. 3. Irregularity of the endplate of the C3 vertebral body inferiorly. This appearance is indicative of either erosive arthropathy or possible discitis. Given concern for potential degree of discitis, cervical MRI pre and post-contrast is felt to be warranted. This study could be performed nonemergently but is warranted. 4. Multilevel arthropathy. Disc space narrowing is most marked at C5-6 and C6-7. There is ankylosis at C5-6. 5. Persistent bony fragment within the canal at the C3-4 level on the left causing impression on the cord and fairly severe stenosis. MR could be helpful for further delineation of the cord in this area. 6. Aortic atherosclerosis. Vascular calcifications at multiple levels including foci of carotid artery calcification bilaterally. Aortic Atherosclerosis (ICD10-I70.0). Electronically Signed   By: Lowella Grip III M.D.   On: 03/18/2017 10:31   Ct Hip Left Wo Contrast  Result Date: 03/18/2017 CLINICAL DATA:  Left hip pain after fall. EXAM: CT OF THE LEFT HIP WITHOUT CONTRAST TECHNIQUE: Multidetector CT imaging of the left hip was performed according to the standard protocol. Multiplanar CT image reconstructions  were also generated. COMPARISON:  CT abdomen and pelvis dated May 04, 2015. FINDINGS: Bones/Joint/Cartilage There is a minimally comminuted, predominantly subcapital fracture of the left femoral neck with slight apex anterior and angulation and approximately 1.6 cm of impaction. No additional fractures identified. Mild degenerative changes of the left hip joint and left sacroiliac joint. Diffuse osteopenia. Ligaments Suboptimally assessed by CT. Muscles and Tendons No focal abnormality. Soft tissues Markedly enlarged prostate with central gland hypertrophy.  Sigmoid diverticulosis. Atherosclerotic vascular calcifications. Small fat containing left inguinal hernia. IMPRESSION: 1. Minimally comminuted, predominantly subcapital fracture of the left femoral neck as described above. Electronically Signed   By: Titus Dubin M.D.   On: 03/18/2017 10:34   Dg Hip Port Unilat With Pelvis 1v Left  Result Date: 03/18/2017 CLINICAL DATA:  Left hip fracture EXAM: DG HIP (WITH OR WITHOUT PELVIS) 1V PORT LEFT COMPARISON:  None. FINDINGS: Fracture of the left femoral neck with mild impaction and displacement. No other acute fracture Mild degenerative change in both hip joints. IMPRESSION: Mildly displaced left femoral neck fracture. Electronically Signed   By: Franchot Gallo M.D.   On: 03/18/2017 13:09        Scheduled Meds: . ALPRAZolam  0.25 mg Oral BID  . aspirin EC  81 mg Oral Daily  . atorvastatin  40 mg Oral q1800  . enoxaparin (LOVENOX) injection  40 mg Subcutaneous Q24H  . finasteride  5 mg Oral Daily  . fludrocortisone  0.1 mg Oral Daily  . insulin aspart  0-15 Units Subcutaneous TID WC  . insulin aspart  0-5 Units Subcutaneous QHS  . insulin glargine  10 Units Subcutaneous QHS  . levothyroxine  50 mcg Oral QAC breakfast  . pantoprazole  40 mg Oral Daily  . PARoxetine  20 mg Oral Daily  . potassium chloride SA  40 mEq Oral BID  . tamsulosin  0.4 mg Oral Daily   Continuous Infusions:   LOS: 1 day    Time spent: 30 minutes. Greater than 50% of this time was spent in direct contact with the patient coordinating care.     Lelon Frohlich, MD [facility] Pager [phone]  If 7PM-7AM, please contact night-coverage [email or web address] Password [email or web address] 03/19/2017, 2:49 PM

## 2017-03-19 NOTE — ED Notes (Signed)
Called Dr. Jerilee Hoh to discuss possibility of transferring pt to [facility] because they have ortho beds.  Was instructed to call Dr. Stann Mainland.  Per Dr. Stann Mainland, he wants pt admitted to [facility] because surgery is scheduled [facility] on Sept 10.

## 2017-03-19 NOTE — ED Notes (Signed)
Accidentally picked up 2 tablets of xanax out of med pyxis. Opened both packets when I realized this. Gave pt the prescribed amt of medication and put the other in the sharps box since it was opened. Witnessed by Lavenia Atlas RN

## 2017-03-19 NOTE — ED Notes (Signed)
Pt c/o chest pain that he describes as a burning sensation. ekg done and troponin order in EDP notified.

## 2017-03-19 NOTE — ED Notes (Signed)
Spoke with Shaun at [facility] bout a bed.  States, "it may be latter today before a bed is available, and that his planned surgery is for tomorrow".  Pts nurse Charlena Cross) informed.

## 2017-03-19 NOTE — ED Notes (Signed)
Pt has eaten a breakfast and lunch tray

## 2017-03-19 NOTE — ED Notes (Addendum)
CBG 130, but has not crossed over yet from machine

## 2017-03-19 NOTE — ED Notes (Signed)
Per Dr. Jerilee Hoh, can give pt 81mg  EC aspirin

## 2017-03-20 ENCOUNTER — Inpatient Hospital Stay (HOSPITAL_COMMUNITY): Payer: Medicare Other

## 2017-03-20 DIAGNOSIS — Z794 Long term (current) use of insulin: Secondary | ICD-10-CM

## 2017-03-20 DIAGNOSIS — R509 Fever, unspecified: Secondary | ICD-10-CM

## 2017-03-20 DIAGNOSIS — E119 Type 2 diabetes mellitus without complications: Secondary | ICD-10-CM

## 2017-03-20 LAB — TSH: TSH: 1.73 u[IU]/mL (ref 0.350–4.500)

## 2017-03-20 LAB — BASIC METABOLIC PANEL
Anion gap: 10 (ref 5–15)
BUN: 21 mg/dL — AB (ref 6–20)
CALCIUM: 9.1 mg/dL (ref 8.9–10.3)
CO2: 22 mmol/L (ref 22–32)
CREATININE: 1.04 mg/dL (ref 0.61–1.24)
Chloride: 104 mmol/L (ref 101–111)
GFR calc Af Amer: >60 mL/min (ref >60)
GLUCOSE: 132 mg/dL — AB (ref 65–99)
Potassium: 4.6 mmol/L (ref 3.5–5.1)
Sodium: 136 mmol/L (ref 135–145)

## 2017-03-20 LAB — GLUCOSE, CAPILLARY
GLUCOSE-CAPILLARY: 155 mg/dL — AB (ref 65–99)
GLUCOSE-CAPILLARY: 215 mg/dL — AB (ref 65–99)
GLUCOSE-CAPILLARY: 159 mg/dL — AB (ref 65–99)
Glucose-Capillary: 152 mg/dL — ABNORMAL HIGH (ref 65–99)

## 2017-03-20 LAB — HEMOGLOBIN A1C
Hgb A1c MFr Bld: 6.6 % — ABNORMAL HIGH (ref 4.8–5.6)
Mean Plasma Glucose: 142.72 mg/dL

## 2017-03-20 LAB — MAGNESIUM: Magnesium: 1.9 mg/dL (ref 1.7–2.4)

## 2017-03-20 MED ORDER — GLUCERNA SHAKE PO LIQD
237.0000 mL | Freq: Every day | ORAL | Status: DC
  Administered 2017-03-20 – 2017-03-25 (×5): 237 mL via ORAL

## 2017-03-20 NOTE — Progress Notes (Signed)
PROGRESS NOTE    Jeffrey [name]  KGU:[id number] DOB: [birth year] DOA: 03/18/2017 PCP: Jani Gravel, MD     Brief Narrative:  83 year old man admitted to the hospital on 9/5 following a mechanical fall that resulted in a left hip fracture. Evaluated by Dr. Aline Brochure who felt the patient was medically too complex to undergo further treatment at this hospital and was recommended to be transferred to [facility] for further care, however he remains in the [facility] emergency department pending bed availability.   Assessment & Plan:   Active Problems:   Hyperlipidemia   Arteriosclerotic cardiovascular disease (ASCVD)   Essential hypertension   GERD (gastroesophageal reflux disease)   Hypothyroidism   DM type 2 causing vascular disease (HCC)   Left displaced femoral neck fracture (HCC)   Cervical spinal stenosis    Feverx1 : 100.8 on 9/6 evening while at [employer],  no leukocytosis, will get ua/cxr, blood culture,  Start incentive spirometer  Left femoral neck fracture -Following mechanical fall. -patient is transferred to [facility] for surgery due to multiple risk factor. -Since he is on Brillinta this will need to be held for at least 5 days prior to surgery. Surgery is posted on monday  Coronary artery disease -Stable, no chest pain. -He actually saw his cardiologist on the day prior to presentation on 9/4 where it was noted that he was symptomatically stable. -Plan to continue aspirin, Coreg, Lipitor. Brillinta is on hold for now in light of upcoming surgery.  Hyperlipidemia -Continue Lipitor  Hypertension -Well-controlled, continue Coreg.  Hypothyroidism -Continue Synthroid  Insulin dependent Diabetes -Fair control, continue current management.  Cervical spine stenosis -Noted on C-spine CT ordered on admission. -Also on this CT there was noted an irregularity of the C3 vertebral body with concerns for discitis. -However in this patient without fever, white count,  weakness, I feel this can be evaluated non-emergently with a C-spine MRI after correction of his acute fracture has been completed.  H/o anxiety attack when he lives by him self a year ago, per daughter patient has had frequent ED visit due to anxiety attacks/chest pain prior to transitioned to SNF a year ago Currently stable on home meds xanax 0.25bid   DVT prophylaxis: Lovenox Code Status: Full code Family Communication: Patient, daughter Janae Bridgeman over the phone at length Disposition Plan:  OR on Monday once off brillinta for [facility] once bed available  Consultants:   Orthopedics, Dr. Rogers/Dr Swinteck  Procedures:   None  Antimicrobials:  Anti-infectives    Start     Dose/Rate Route Frequency Ordered Stop   03/23/17 0600  ceFAZolin (ANCEF) IVPB 2g/100 mL premix     2 g 200 mL/hr over 30 Minutes Intravenous On call to O.R. 03/19/17 2244 03/24/17 0559       Subjective: tmax 100.8 yesterday evening around 7pm when he was at [facility] He is transferred to [facility] over night,  tele siter in room now due to reported confusion 9/6 -9/7 night after arriving to [place] This am, he seems to be oriented x3, report he has been at a SNf for a year, he reports use a wheelchair for the last year, but he can walk.  He denies pain at rest this am  Objective: Vitals:   03/19/17 2100 03/19/17 2103 03/20/17 0052 03/20/17 0523  BP:  (!) 147/72 (!) 109/54 111/69  Pulse:  (!) 103 94 94  Resp:  20 18 20   Temp:  98.2  F (36.8 C) 98 F (36.7 C) 98.4 F (36.9 C)  TempSrc:  Oral Oral Oral  SpO2:  93% 93% 94%  Weight: 77.5 kg (170 lb 12.8 oz)     Height: 5\' 10"  (1.778 m)      No intake or output data in the 24 hours ending 03/20/17 0857 Filed Weights   03/18/17 0731 03/19/17 2100  Weight: 80.7 kg (178 lb) 77.5 kg (170 lb 12.8 oz)    Examination:  General exam: Alert, awake, oriented x 3 Respiratory system: Clear to auscultation. Respiratory effort  normal. Cardiovascular system:RRR. No murmurs, rubs, gallops. Gastrointestinal system: Abdomen is nondistended, soft and nontender. No organomegaly or masses felt. Normal bowel sounds heard. Central nervous system: Alert and oriented. No focal neurological deficits. Extremities: No C/C/E, +pedal pulses, left leg shortened Skin: No rashes, lesions or ulcers Psychiatry: Judgement and insight appear normal. Mood & affect appropriate.     Data Reviewed: I have personally reviewed following labs and imaging studies  CBC:  Recent Labs Lab 03/18/17 0808 03/19/17 0255  WBC 6.3 9.9  NEUTROABS 4.7  --   HGB 13.0 13.0  HCT 38.9* 38.5*  MCV 94.9 95.1  PLT 137* 174*   Basic Metabolic Panel:  Recent Labs Lab 03/18/17 0808 03/19/17 0255  NA 136 136  K 3.9 4.0  CL 103 105  CO2 24 24  GLUCOSE 151* 142*  BUN 15 18  CREATININE 0.85 0.91  CALCIUM 9.3 8.8*   GFR: Estimated Creatinine Clearance: 60.2 mL/min (by C-G formula based on SCr of 0.91 mg/dL). Liver Function Tests:  Recent Labs Lab 03/18/17 0808  AST 22  ALT 18  ALKPHOS 110  BILITOT 0.5  PROT 6.6  ALBUMIN 3.8   No results for input(s): LIPASE, AMYLASE in the last 168 hours. No results for input(s): AMMONIA in the last 168 hours. Coagulation Profile: No results for input(s): INR, PROTIME in the last 168 hours. Cardiac Enzymes:  Recent Labs Lab 03/19/17 0255 03/19/17 0821 03/19/17 1425  TROPONINI <0.03 <0.03 <0.03   BNP (last 3 results) No results for input(s): PROBNP in the last 8760 hours. HbA1C: No results for input(s): HGBA1C in the last 72 hours. CBG:  Recent Labs Lab 03/19/17 0725 03/19/17 1245 03/19/17 1816 03/19/17 2212 03/20/17 0643  GLUCAP 130* 139* 132* 170* 155*   Lipid Profile: No results for input(s): CHOL, HDL, LDLCALC, TRIG, CHOLHDL, LDLDIRECT in the last 72 hours. Thyroid Function Tests: No results for input(s): TSH, T4TOTAL, FREET4, T3FREE, THYROIDAB in the last 72 hours. Anemia  Panel: No results for input(s): VITAMINB12, FOLATE, FERRITIN, TIBC, IRON, RETICCTPCT in the last 72 hours. Urine analysis:    Component Value Date/Time   COLORURINE YELLOW 06/22/2015 0850   APPEARANCEUR CLEAR 06/22/2015 0850   LABSPEC 1.010 06/22/2015 0850   PHURINE 7.0 06/22/2015 0850   GLUCOSEU 100 (A) 06/22/2015 0850   HGBUR NEGATIVE 06/22/2015 0850   BILIRUBINUR NEGATIVE 06/22/2015 0850   KETONESUR TRACE (A) 06/22/2015 0850   PROTEINUR NEGATIVE 06/22/2015 0850   UROBILINOGEN 0.2 05/19/2015 1449   NITRITE NEGATIVE 06/22/2015 0850   LEUKOCYTESUR NEGATIVE 06/22/2015 0850   Sepsis Labs: @LABRCNTIP (procalcitonin:4,lacticidven:4)  ) Recent Results (from the past 240 hour(s))  Surgical pcr screen     Status: None   Collection Time: 03/19/17  9:12 PM  Result Value Ref Range Status   MRSA, PCR NEGATIVE NEGATIVE Final   Staphylococcus aureus NEGATIVE NEGATIVE Final    Comment: (NOTE) The Xpert SA Assay (FDA approved for NASAL specimens in  patients 12 years of age and older), is one component of a comprehensive surveillance program. It is not intended to diagnose infection nor to guide or monitor treatment.          Radiology Studies: Ct Head Wo Contrast  Result Date: 03/18/2017 CLINICAL DATA:  Pain following fall EXAM: CT HEAD WITHOUT CONTRAST CT CERVICAL SPINE WITHOUT CONTRAST TECHNIQUE: Multidetector CT imaging of the head and cervical spine was performed following the standard protocol without intravenous contrast. Multiplanar CT image reconstructions of the cervical spine were also generated. COMPARISON:  Head CT March 21, 2012; cervical spine CT April 07, 2010 FINDINGS: CT HEAD FINDINGS Brain: There is moderate diffuse atrophy. There is no intracranial mass, hemorrhage, extra-axial fluid collection, or midline shift. There is mild patchy small vessel disease in the centra semiovale bilaterally. Elsewhere gray-white compartments appear normal. No evident acute infarct.  Note that a prior focal infarct in the right pons is less well seen at this time. Vascular: No hyperdense vessel. There is calcification in each carotid siphon region. There is also calcification in each distal vertebral artery. Skull: Bony calvarium appears intact. Sinuses/Orbits: There is mucosal thickening in multiple ethmoid air cells bilaterally. There is slight mucosal thickening in the inferior right frontal sinus. There is slight mucosal thickening in each posterior maxillary antrum. Other visualized paranasal sinuses are clear. Orbits appear symmetric bilaterally. Other: Mastoid air cells clear. CT CERVICAL SPINE FINDINGS Alignment: There is just under 4 mm of anterolisthesis of C3 on C4, not present on prior study. There is 3 mm of anterolisthesis of C4 on C5, present on prior study. Skull base and vertebrae: There is a moderate pannus posterior to the odontoid which is not causing appreciable impression on the craniocervical junction. There is bony hypertrophy along the superior odontoid, unchanged, not causing impression on the craniocervical junction. There is cystic change in the odontoid, stable. There is no acute fracture. There is no well-defined blastic or lytic bone lesion. In comparison with the previous study, there is irregularity and erosive change involving the inferior endplate of the C3 vertebral body. This appearance may be indicative of a degree of discitis. This appearance represents a significant change from prior study. Soft tissues and spinal canal: Prevertebral soft tissues and predental space regions are normal. No paraspinous lesions are noted. There is no cord edema or consolidation. There is again noted a bony fragment posterior to C3-4 toward the left which is impressing on the cord from a ventral approach causing moderately severe spinal stenosis focally. This finding was present on prior study and stable. Disc levels: There is marked disc space narrowing with partial ankylosis  at C5-6 and C6-7, also present on previous study. There is moderate disc space narrowing at C3-4 with concern for discitis involving the inferior C3 endplate as noted above. There is multifocal exit foraminal narrowing due to bony hypertrophy. No frank disc extrusion. Spinal stenosis at C3-4 due to the previously noted bony fragment is a stable finding. There is nuchal ligament calcification posterior to C5. Upper chest: Visualized upper lung zones are clear. There is aortic atherosclerosis. There is calcification in the left and right subclavian arteries. Other: There is bilateral carotid artery calcification. IMPRESSION: CT head: Atrophy with patchy periventricular small vessel disease. No intracranial mass hemorrhage, or extra-axial fluid collection. No acute infarct evident. There are foci of arterial vascular calcification. Areas of paranasal sinus disease noted. CT cervical spine: 1.  No acute fracture is evident. 2. Areas of spondylolisthesis at C3-4 and  C4-5. The spondylolisthesis at C3-4 was not present previously. 3. Irregularity of the endplate of the C3 vertebral body inferiorly. This appearance is indicative of either erosive arthropathy or possible discitis. Given concern for potential degree of discitis, cervical MRI pre and post-contrast is felt to be warranted. This study could be performed nonemergently but is warranted. 4. Multilevel arthropathy. Disc space narrowing is most marked at C5-6 and C6-7. There is ankylosis at C5-6. 5. Persistent bony fragment within the canal at the C3-4 level on the left causing impression on the cord and fairly severe stenosis. MR could be helpful for further delineation of the cord in this area. 6. Aortic atherosclerosis. Vascular calcifications at multiple levels including foci of carotid artery calcification bilaterally. Aortic Atherosclerosis (ICD10-I70.0). Electronically Signed   By: Lowella Grip III M.D.   On: 03/18/2017 10:31   Ct Cervical Spine Wo  Contrast  Result Date: 03/18/2017 CLINICAL DATA:  Pain following fall EXAM: CT HEAD WITHOUT CONTRAST CT CERVICAL SPINE WITHOUT CONTRAST TECHNIQUE: Multidetector CT imaging of the head and cervical spine was performed following the standard protocol without intravenous contrast. Multiplanar CT image reconstructions of the cervical spine were also generated. COMPARISON:  Head CT March 21, 2012; cervical spine CT April 07, 2010 FINDINGS: CT HEAD FINDINGS Brain: There is moderate diffuse atrophy. There is no intracranial mass, hemorrhage, extra-axial fluid collection, or midline shift. There is mild patchy small vessel disease in the centra semiovale bilaterally. Elsewhere gray-white compartments appear normal. No evident acute infarct. Note that a prior focal infarct in the right pons is less well seen at this time. Vascular: No hyperdense vessel. There is calcification in each carotid siphon region. There is also calcification in each distal vertebral artery. Skull: Bony calvarium appears intact. Sinuses/Orbits: There is mucosal thickening in multiple ethmoid air cells bilaterally. There is slight mucosal thickening in the inferior right frontal sinus. There is slight mucosal thickening in each posterior maxillary antrum. Other visualized paranasal sinuses are clear. Orbits appear symmetric bilaterally. Other: Mastoid air cells clear. CT CERVICAL SPINE FINDINGS Alignment: There is just under 4 mm of anterolisthesis of C3 on C4, not present on prior study. There is 3 mm of anterolisthesis of C4 on C5, present on prior study. Skull base and vertebrae: There is a moderate pannus posterior to the odontoid which is not causing appreciable impression on the craniocervical junction. There is bony hypertrophy along the superior odontoid, unchanged, not causing impression on the craniocervical junction. There is cystic change in the odontoid, stable. There is no acute fracture. There is no well-defined blastic or lytic  bone lesion. In comparison with the previous study, there is irregularity and erosive change involving the inferior endplate of the C3 vertebral body. This appearance may be indicative of a degree of discitis. This appearance represents a significant change from prior study. Soft tissues and spinal canal: Prevertebral soft tissues and predental space regions are normal. No paraspinous lesions are noted. There is no cord edema or consolidation. There is again noted a bony fragment posterior to C3-4 toward the left which is impressing on the cord from a ventral approach causing moderately severe spinal stenosis focally. This finding was present on prior study and stable. Disc levels: There is marked disc space narrowing with partial ankylosis at C5-6 and C6-7, also present on previous study. There is moderate disc space narrowing at C3-4 with concern for discitis involving the inferior C3 endplate as noted above. There is multifocal exit foraminal narrowing due to bony hypertrophy.  No frank disc extrusion. Spinal stenosis at C3-4 due to the previously noted bony fragment is a stable finding. There is nuchal ligament calcification posterior to C5. Upper chest: Visualized upper lung zones are clear. There is aortic atherosclerosis. There is calcification in the left and right subclavian arteries. Other: There is bilateral carotid artery calcification. IMPRESSION: CT head: Atrophy with patchy periventricular small vessel disease. No intracranial mass hemorrhage, or extra-axial fluid collection. No acute infarct evident. There are foci of arterial vascular calcification. Areas of paranasal sinus disease noted. CT cervical spine: 1.  No acute fracture is evident. 2. Areas of spondylolisthesis at C3-4 and C4-5. The spondylolisthesis at C3-4 was not present previously. 3. Irregularity of the endplate of the C3 vertebral body inferiorly. This appearance is indicative of either erosive arthropathy or possible discitis. Given  concern for potential degree of discitis, cervical MRI pre and post-contrast is felt to be warranted. This study could be performed nonemergently but is warranted. 4. Multilevel arthropathy. Disc space narrowing is most marked at C5-6 and C6-7. There is ankylosis at C5-6. 5. Persistent bony fragment within the canal at the C3-4 level on the left causing impression on the cord and fairly severe stenosis. MR could be helpful for further delineation of the cord in this area. 6. Aortic atherosclerosis. Vascular calcifications at multiple levels including foci of carotid artery calcification bilaterally. Aortic Atherosclerosis (ICD10-I70.0). Electronically Signed   By: Lowella Grip III M.D.   On: 03/18/2017 10:31   Ct Hip Left Wo Contrast  Result Date: 03/18/2017 CLINICAL DATA:  Left hip pain after fall. EXAM: CT OF THE LEFT HIP WITHOUT CONTRAST TECHNIQUE: Multidetector CT imaging of the left hip was performed according to the standard protocol. Multiplanar CT image reconstructions were also generated. COMPARISON:  CT abdomen and pelvis dated May 04, 2015. FINDINGS: Bones/Joint/Cartilage There is a minimally comminuted, predominantly subcapital fracture of the left femoral neck with slight apex anterior and angulation and approximately 1.6 cm of impaction. No additional fractures identified. Mild degenerative changes of the left hip joint and left sacroiliac joint. Diffuse osteopenia. Ligaments Suboptimally assessed by CT. Muscles and Tendons No focal abnormality. Soft tissues Markedly enlarged prostate with central gland hypertrophy. Sigmoid diverticulosis. Atherosclerotic vascular calcifications. Small fat containing left inguinal hernia. IMPRESSION: 1. Minimally comminuted, predominantly subcapital fracture of the left femoral neck as described above. Electronically Signed   By: Titus Dubin M.D.   On: 03/18/2017 10:34   Dg Hip Port Unilat With Pelvis 1v Left  Result Date: 03/18/2017 CLINICAL DATA:   Left hip fracture EXAM: DG HIP (WITH OR WITHOUT PELVIS) 1V PORT LEFT COMPARISON:  None. FINDINGS: Fracture of the left femoral neck with mild impaction and displacement. No other acute fracture Mild degenerative change in both hip joints. IMPRESSION: Mildly displaced left femoral neck fracture. Electronically Signed   By: Franchot Gallo M.D.   On: 03/18/2017 13:09        Scheduled Meds: . ALPRAZolam  0.25 mg Oral BID  . aspirin EC  81 mg Oral Daily  . atorvastatin  40 mg Oral q1800  . [START ON 03/23/2017] chlorhexidine  60 mL Topical Once  . enoxaparin (LOVENOX) injection  40 mg Subcutaneous Q24H  . finasteride  5 mg Oral Daily  . fludrocortisone  0.1 mg Oral Daily  . insulin aspart  0-15 Units Subcutaneous TID WC  . insulin aspart  0-5 Units Subcutaneous QHS  . insulin glargine  10 Units Subcutaneous QHS  . levothyroxine  50 mcg Oral  QAC breakfast  . pantoprazole  40 mg Oral Daily  . PARoxetine  20 mg Oral Daily  . potassium chloride SA  40 mEq Oral BID  . [START ON 03/23/2017] povidone-iodine  2 application Topical Once  . tamsulosin  0.4 mg Oral Daily   Continuous Infusions: . [START ON 03/23/2017]  ceFAZolin (ANCEF) IV       LOS: 2 days    Time spent: 35 minutes. Greater than 50% of this time was spent in direct contact with the patient coordinating care.  I have personally reviewed and interpreted on  03/20/2017  daily labs, EKG,  imagings as discussed above under data review session and assessment and plans.  I reviewed all nursing notes, pharmacy notes, consultant notes,  vitals, pertinent old records  I have discussed plan of care as described above with RN , patient and family on 03/20/2017     Ailene Royal, MD PhD [facility] Pager [phone]  If 7PM-7AM, please contact night-coverage [email or web address] Password [email or web address] 03/20/2017, 8:57 AM

## 2017-03-20 NOTE — Consult Note (Signed)
ORTHOPAEDIC CONSULTATION  REQUESTING PHYSICIAN: Florencia Reasons, MD  PCP:  Jani Gravel, MD  Chief Complaint: left hip pain.  HPI: Jeffrey [name] is a 83 y.o. male who complains of  Left hip pain after he fell on 03/18/2017. He was taken to the emergency department at [facility], where x-rays revealed a displaced left femoral neck fracture. He was evaluated by Dr. Arther Abbott, who recommended transfer to [facility]. I was asked to see the patient by my partner, Dr. Stann Mainland.he was admitted to the hospitalist service. The patient takes Brillinta, so we are waiting 5 days to proceed with surgery.  Past Medical History:  Diagnosis Date  . Anxiety about health    "multiple somatic complaints"  . Benign prostatic hypertrophy    History of urinary retention  . Cervical vertebral fracture ([place])   . CHF (congestive heart failure) ([place])   . Chronic back pain   . Chronic chest pain   . Coronary atherosclerosis of native coronary artery    a. CABG x 4 in 1989 (VG->OM1->OM2, VG->RCA, LIMA->LAD), b. 05/2010: DES to VG-OM1/OM2, DES to distal LCx. c. NSTEMI in 04/2011 - TO distal LCX stent and VG->OM2. d. 02/2012 NSTEMI DES to VG-OM1/continuation to OM2 occluded. e. inferior STEMI s/p DES to SVG-RAMUS 06/2012. f. inferolat STEMI 09/2012 s/p DES to SVG-interm; g. Lex MV (11/14):  EF 35%, inf-lat scar with small peri-infarct ischemia  . Diabetes mellitus, type II ([place])   . Dizziness and giddiness   . Dysphagia   . Essential hypertension, benign   . Gastroesophageal reflux disease   . GERD (gastroesophageal reflux disease)   . History of pneumonia   . History of stroke   . Hyperlipidemia   . Hyperlipidemia   . Hypothyroidism   . Ischemic cardiomyopathy Nov 2015   EF 35% cath, 45-50% by echo  . MI, acute, non ST segment elevation ([place])   . Osteoarthritis   . Peptic ulcer   . Peptic ulcer disease   . ST elevation (STEMI) myocardial infarction ([place])   . Stroke ([place])   . Syncope and collapse     Past Surgical History:  Procedure Laterality Date  . CARDIAC CATHETERIZATION  05/19/14   SVG-OM occl- medical Rx  . CORONARY ANGIOPLASTY  10/12, 8/13, 12/13, 3/14   SVG-OM PCI  . CORONARY ARTERY BYPASS GRAFT  1989  . EGD WITH FOREIGN BODY REMOVAL  08/13/2009   Food impaction  . LEFT HEART CATHETERIZATION WITH CORONARY ANGIOGRAM N/A 11/01/2011   Procedure: LEFT HEART CATHETERIZATION WITH CORONARY ANGIOGRAM;  Surgeon: Lorretta Harp, MD;  Location: [facility] CATH LAB;  Service: Cardiovascular;  Laterality: N/A;  . LEFT HEART CATHETERIZATION WITH CORONARY ANGIOGRAM N/A 06/18/2012   Procedure: LEFT HEART CATHETERIZATION WITH CORONARY ANGIOGRAM;  Surgeon: Peter M Martinique, MD;  Location: [facility] CATH LAB;  Service: Cardiovascular;  Laterality: N/A;  . LEFT HEART CATHETERIZATION WITH CORONARY/GRAFT ANGIOGRAM N/A 03/10/2012   Procedure: LEFT HEART CATHETERIZATION WITH [name];  Surgeon: Sherren Mocha, MD;  Location: [facility] CATH LAB;  Service: Cardiovascular;  Laterality: N/A;  . LEFT HEART CATHETERIZATION WITH CORONARY/GRAFT ANGIOGRAM  10/06/2012   Procedure: LEFT HEART CATHETERIZATION WITH [name];  Surgeon: Burnell Blanks, MD;  Location: [facility] CATH LAB;  Service: Cardiovascular;;  . LEFT HEART CATHETERIZATION WITH CORONARY/GRAFT ANGIOGRAM N/A 11/09/2012   Procedure: LEFT HEART CATHETERIZATION WITH [name];  Surgeon: Peter M Martinique, MD;  Location: [facility] CATH LAB;  Service: Cardiovascular;  Laterality: N/A;  . LEFT HEART CATHETERIZATION WITH  CORONARY/GRAFT ANGIOGRAM N/A 05/19/2014   Procedure: LEFT HEART CATHETERIZATION WITH [name];  Surgeon: Troy Sine, MD;  Location: [facility] CATH LAB;  Service: Cardiovascular;  Laterality: N/A;  . PERCUTANEOUS CORONARY STENT INTERVENTION (PCI-S) N/A 11/01/2011   Procedure: PERCUTANEOUS CORONARY STENT INTERVENTION (PCI-S);  Surgeon: Lorretta Harp, MD;  Location: [facility] CATH LAB;  Service: Cardiovascular;  Laterality:  N/A;  . PERCUTANEOUS CORONARY STENT INTERVENTION (PCI-S)  06/18/2012   Procedure: PERCUTANEOUS CORONARY STENT INTERVENTION (PCI-S);  Surgeon: Peter M Martinique, MD;  Location: [facility] CATH LAB;  Service: Cardiovascular;;  . PERCUTANEOUS CORONARY STENT INTERVENTION (PCI-S)  10/06/2012   Procedure: PERCUTANEOUS CORONARY STENT INTERVENTION (PCI-S);  Surgeon: Burnell Blanks, MD;  Location: [facility] CATH LAB;  Service: Cardiovascular;;  . TONSILLECTOMY     Social History   Social History  . Marital status: Divorced    Spouse name: N/A  . Number of children: N/A  . Years of education: N/A   Occupational History  . Retired     Designer, television/film set   Social History Main Topics  . Smoking status: Former Smoker    Packs/day: 2.00    Years: 10.00    Types: Cigarettes    Start date: 07/14/1950    Quit date: 07/14/1961  . Smokeless tobacco: Former Systems developer    Types: Chew    Quit date: 12/21/2015  . Alcohol use No  . Drug use: No  . Sexual activity: No   Other Topics Concern  . None   Social History Narrative   ** Merged History Encounter **...   He was raised by his uncle.  His mother deceased when   he was 37 year old with appendicitis. Father deceased from an MI at age   76.  He has 1 sister, but he does not know her health status as they   have been separated.          Family History  Problem Relation Age of Onset  . Appendicitis Mother        Pt was 42 year old  . Heart attack Father 76  . Early death Unknown        Parents died young  . Colon cancer Neg Hx    No Known Allergies Prior to Admission medications   Medication Sig Start Date End Date Taking? Authorizing Provider  acetaminophen (TYLENOL) 325 MG tablet Take 2 tablets (650 mg total) by mouth every 4 (four) hours as needed for headache or mild pain. 05/21/14  Yes Kilroy, Luke K, PA-C  albuterol (PROVENTIL HFA;VENTOLIN HFA) 108 (90 BASE) MCG/ACT inhaler Inhale 2 puffs into the lungs every 6 (six) hours as needed for wheezing or shortness of  breath.   Yes [provider]  ALPRAZolam (XANAX) 0.25 MG tablet Take 0.25 mg by mouth 2 (two) times daily.   Yes [provider]  aluminum-magnesium hydroxide-simethicone (MAALOX) 200-200-20 MG/5ML SUSP Take 15 mLs by mouth every 6 (six) hours as needed (indgestion).   Yes [provider]  aspirin EC 81 MG tablet Take 81 mg by mouth daily.   Yes [provider]  atorvastatin (LIPITOR) 40 MG tablet Take 1 tablet (40 mg total) by mouth daily at 6 PM. 05/29/13  Yes Weaver, Scott T, PA-C  finasteride (PROSCAR) 5 MG tablet Take 5 mg by mouth daily.   Yes [provider]  fludrocortisone (FLORINEF) 0.1 MG tablet Take 0.1 mg by mouth daily.   Yes [provider]  Hexylresorcinol (THROAT LOZENGES MT) Use as directed 1  lozenge in the mouth or throat every 4 (four) hours as needed (sore throat).   Yes [provider]  insulin glargine (LANTUS) 100 unit/mL SOPN Inject 10 Units into the skin at bedtime.   Yes [provider]  insulin lispro (HUMALOG) 100 UNIT/ML KwikPen Junior Inject 2-12 Units into the skin 4 (four) times daily. 151-200= 2 units; 201-250= 4 units; 251-300= 6 units; 301-350 = 8 units; 351-400= 10 units; >401 = 12 units and call MD.   Yes [provider]  levothyroxine (SYNTHROID, LEVOTHROID) 50 MCG tablet Take 50 mcg by mouth daily before breakfast. Reported on 10/10/2015 09/08/14  Yes [provider]  loperamide (IMODIUM A-D) 2 MG tablet Take 2 mg by mouth every 6 (six) hours as needed for diarrhea or loose stools.   Yes [provider]  meclizine (ANTIVERT) 25 MG tablet Take 25 mg by mouth every 8 (eight) hours as needed for dizziness.   Yes [provider]  Multiple Vitamin (MULTIVITAMIN WITH MINERALS) TABS Take 1 tablet by mouth daily. Reported on 10/10/2015   Yes [provider]  NITROSTAT 0.4 MG SL tablet PLACE 1 TAB UNDER TONGUE EVERY 5 MIN IF NEEDED FOR CHEST PAIN. MAY USE 3  TIMES.NO RELIEF CALL 911. 07/04/15  Yes Lendon Colonel, NP  pantoprazole (PROTONIX) 40 MG tablet Take 40 mg by mouth daily.   Yes [provider]  PARoxetine (PAXIL) 20 MG tablet Take 20 mg by mouth daily.   Yes [provider]  pioglitazone (ACTOS) 15 MG tablet Take 15 mg by mouth daily.   Yes [provider]  polyethylene glycol powder (GLYCOLAX/MIRALAX) powder Take 1 Container by mouth daily as needed for moderate constipation.    Yes [provider]  potassium chloride SA (K-DUR,KLOR-CON) 20 MEQ tablet Take 40 mEq by mouth 2 (two) times daily.   Yes [provider]  tamsulosin (FLOMAX) 0.4 MG CAPS Take 0.4 mg by mouth daily.  01/27/13  Yes [provider]  ticagrelor (BRILINTA) 60 MG TABS tablet Take 1 tablet (60 mg total) by mouth 2 (two) times daily. Patient taking differently: Take 60 mg by mouth daily.  08/18/16  Yes Herminio Commons, MD   Dg Chest Port 1 View  Result Date: 03/20/2017 CLINICAL DATA:  Fever EXAM: PORTABLE CHEST 1 VIEW COMPARISON:  11/23/2015 FINDINGS: Prior CABG. Heart is borderline in size. No confluent opacities, effusions or edema. No acute bony abnormality. IMPRESSION: No active disease. Electronically Signed   By: Rolm Baptise M.D.   On: 03/20/2017 09:48    Positive ROS: All other systems have been reviewed and were otherwise negative with the exception of those mentioned in the HPI and as above.  Physical Exam: General: Alert, no acute distress Cardiovascular: No pedal edema Respiratory: No cyanosis, no use of accessory musculature GI: No organomegaly, abdomen is soft and non-tender Skin: No lesions in the area of chief complaint Neurologic: Sensation intact distally Psychiatric: Patient is competent for consent with normal mood and affect Lymphatic: No axillary or cervical lymphadenopathy  MUSCULOSKELETAL: examination of the left lower extremity reveals no skin wounds or lesions. He has shortening and  external rotation. He has pain with attempted logrolling of the hip. He has intact plantar flexion and dorsiflexion. He has palpable pulses. He reports intact sensation to light touch.  Assessment: Displaced left femoral neck fracture Significant previous heart history on Brillinta  Plan: I discussed the findings with the patient. I recommended left hip hemiarthroplasty to allow for  mobilization out of bed. We discussed the risks, benefits, and alternatives to surgery. The patient understands and wishes to proceed. Due to the patient's usage of Brillinta, we will plan for surgery on Monday. Nothing by mouth after midnight on Sunday night. Hold chemical DVT prophylaxis. At the patient's request, I called the patient's daughter, Janae Bridgeman, at [phone] however she did not answer.   The risks, benefits, and alternatives were discussed with the patient. There are risks associated with the surgery including, but not limited to, problems with anesthesia (death), infection, instability (giving out of the joint), dislocation, differences in leg length/angulation/rotation, fracture of bones, loosening or failure of implants, hematoma (blood accumulation) which may require surgical drainage, blood clots, pulmonary embolism, nerve injury (foot drop and lateral thigh numbness), and blood vessel injury. The patient understands these risks and elects to proceed.   Arelly Whittenberg, Horald Pollen, MD Cell [phone]    03/20/2017 1:23 PM

## 2017-03-20 NOTE — Progress Notes (Signed)
Initial Nutrition Assessment  DOCUMENTATION CODES:   Not applicable  INTERVENTION:  Provide Glucerna Shake po once daily, each supplement provides 220 kcal and 10 grams of protein.  Encourage adequate PO intake.  NUTRITION DIAGNOSIS:   Increased nutrient needs related to  (surgery) as evidenced by estimated needs.  GOAL:   Patient will meet greater than or equal to 90% of their needs  MONITOR:   PO intake, Supplement acceptance, Weight trends, Labs, Skin, I & O's  REASON FOR ASSESSMENT:   Consult Assessment of nutrition requirement/status  ASSESSMENT:   83 y.o. male with medical history significant of coronary artery disease, diabetes, hypothyroidism, is brought to the hospital today after suffering a fall Patient was found to have a left-sided femoral neck fracture.  Plans for surgery Monday. Diet has been advanced. Pt reports having a good appetite currently and PTA with no other difficulties. Weight record reviewed. Question accuracy of most recent weight recorded. RD to order nutritional supplements to aid in pre-surgery nutrition. Pt with no observed significant fat or muscle mass loss.  Labs and medications reviewed.    Diet Order:  Diet Carb Modified Fluid consistency: Thin; Room service appropriate? Yes  Skin:  Reviewed, no issues  Last BM:  9/6  Height:   Ht Readings from Last 1 Encounters:  03/19/17 5\' 10"  (1.778 m)    Weight:   Wt Readings from Last 1 Encounters:  03/19/17 170 lb 12.8 oz (77.5 kg)    Ideal Body Weight:  75.45 kg  BMI:  Body mass index is 24.51 kg/m.  Estimated Nutritional Needs:   Kcal:  1800-1950  Protein:  70-80 grams  Fluid:  1.8- 1.9 L/day  EDUCATION NEEDS:   No education needs identified at this time  Corrin Parker, MS, RD, LDN Pager # [phone] After hours/ weekend pager # [phone]

## 2017-03-20 NOTE — Progress Notes (Addendum)
Patient is confused and confusion has become worse as the night has progressed.Patient has set bed alarm off multiple times during the night attempting to get out of bed..Each time this nurse has tried to reorient patient but with little success.Very concerned about patient.Text paged Dr. Myna Hidalgo for order for tele sitter.

## 2017-03-20 NOTE — Progress Notes (Signed)
Patient arrived to unit [place] bed 09 from care link.Assisted patient to bed by nursing staff. Oriented patient to nursing unit,call bell, and high fall risk due to left femur fracture and fall today. Yellow bracelet,yellow socks and bed alarm on for safety.Patient is hard of hearing no aids here at hospital.Patient asking for dentures none were brought to hospital .Patient clothing placed in bag and placed in closet,cell phone and cell charger at bedside.No family here at present time.Will defer health history to day shift when family available .

## 2017-03-20 NOTE — Care Management (Signed)
Patient is from [email or web address]. CM will inform Education officer, museum.

## 2017-03-21 LAB — URINALYSIS, ROUTINE W REFLEX MICROSCOPIC
BACTERIA UA: NONE SEEN
BILIRUBIN URINE: NEGATIVE
Bilirubin Urine: NEGATIVE
GLUCOSE, UA: NEGATIVE mg/dL
Glucose, UA: NEGATIVE mg/dL
HGB URINE DIPSTICK: NEGATIVE
Hgb urine dipstick: NEGATIVE
KETONES UR: NEGATIVE mg/dL
Ketones, ur: NEGATIVE mg/dL
LEUKOCYTES UA: NEGATIVE
Leukocytes, UA: NEGATIVE
NITRITE: NEGATIVE
NITRITE: NEGATIVE
PH: 5 (ref 5.0–8.0)
Protein, ur: NEGATIVE mg/dL
Protein, ur: 30 mg/dL — AB
RBC / HPF: NONE SEEN RBC/hpf (ref 0–5)
SPECIFIC GRAVITY, URINE: 1.018 (ref 1.005–1.030)
Specific Gravity, Urine: 1.019 (ref 1.005–1.030)
WBC, UA: NONE SEEN WBC/hpf (ref 0–5)
pH: 5 (ref 5.0–8.0)

## 2017-03-21 LAB — GLUCOSE, CAPILLARY
GLUCOSE-CAPILLARY: 129 mg/dL — AB (ref 65–99)
Glucose-Capillary: 146 mg/dL — ABNORMAL HIGH (ref 65–99)
Glucose-Capillary: 155 mg/dL — ABNORMAL HIGH (ref 65–99)
Glucose-Capillary: 147 mg/dL — ABNORMAL HIGH (ref 65–99)
Glucose-Capillary: 152 mg/dL — ABNORMAL HIGH (ref 65–99)

## 2017-03-21 LAB — CBC
HEMATOCRIT: 38.4 % — AB (ref 39.0–52.0)
Hemoglobin: 12.6 g/dL — ABNORMAL LOW (ref 13.0–17.0)
MCH: 31.2 pg (ref 26.0–34.0)
MCHC: 32.8 g/dL (ref 30.0–36.0)
MCV: 95 fL (ref 78.0–100.0)
PLATELETS: 138 10*3/uL — AB (ref 150–400)
RBC: 4.04 MIL/uL — AB (ref 4.22–5.81)
RDW: 12.8 % (ref 11.5–15.5)
WBC: 8.2 10*3/uL (ref 4.0–10.5)

## 2017-03-21 NOTE — Progress Notes (Addendum)
Patient ID: Jeffrey [name], male   DOB: 11/22/1928, 83 y.o.   MRN: [id number]   PROGRESS NOTE    JESUA TAMBLYN  NAT:[id number] DOB: 17-Jul-1928 DOA: 03/18/2017  PCP: Jani Gravel, MD   Brief Narrative:  Pt is 83 yo male who was transferred from [facility] to [facility] after an episode of mechanical fall that resulted in left hip fracture.   Assessment & Plan:  Left femoral neck fracture - following mechanical fall - since pt was on Brilinta, this will need to be held for at least 5 days prior to surgery - surgery was tentatively scheduled for Monday 03/23/2017  Fevers on admission - up to 100.6 F on 9/06 while at [facility] - no fevers in the past 24 hours - pt to receive pre op ABX - no leukocytosis  - will follow up on urine and blood cultures   Coronary artery disease - stable, no anginal concerns this AM  - continue Aspirin, Coreg, Lipitor - Brilinta on hold as noted above  Hyperlipidemia - continue Lipitor   Hypertension - reasonable inpatient control - continue Coreg   Hypothyroidism - TSH is stable and WNL - continue synthroid   Insulin dependent Diabetes - stable A1C, keep on Lantus 10 U daily and SSI for now   Cervical spine stenosis - Noted on C-spine CT ordered on admission. - Also on this CT there was noted an irregularity of the C3 vertebral body, ? discitis. - after femoral neck fracture repair, can proceed with C-spine MRI for clearer evaluation - pt is currently with no fevers, no leukocytosis and will continue to monitor for now   Thrombocytopenia - mild, suspected to be reactive - monitor    DVT prophylaxis: Lovenox SQ Code Status: Full Family Communication: pt at bedside Disposition Plan: to be determined    Consultants:   Ortho  Procedures:   None  Antimicrobials:   None  Subjective: Pt reports feeling ok this AM, still with pain in the left hip.   Objective: Vitals:   03/20/17 0052 03/20/17 0523 03/20/17 2047 03/21/17  0744  BP: (!) 109/54 111/69 (!) 125/51 131/69  Pulse: 94 94 97 83  Resp: 18 20    Temp: 98 F (36.7 C) 98.4 F (36.9 C) 98.2 F (36.8 C) 98.1 F (36.7 C)  TempSrc: Oral Oral Oral Oral  SpO2: 93% 94% 97% 97%  Weight:      Height:        Intake/Output Summary (Last 24 hours) at 03/21/17 0830 Last data filed at 03/21/17 0502  Gross per 24 hour  Intake              480 ml  Output              450 ml  Net               30 ml   Filed Weights   03/18/17 0731 03/19/17 2100  Weight: 80.7 kg (178 lb) 77.5 kg (170 lb 12.8 oz)    Examination:  General exam: Appears calm and comfortable  Respiratory system: Clear to auscultation. Respiratory effort normal. Cardiovascular system: RRR. No JVD, rubs, gallops or clicks. No pedal edema. Gastrointestinal system: Abdomen is nondistended, soft and nontender. No organomegaly or masses felt. Normal bowel sounds heard. Central nervous system: Alert and oriented. No focal neurological deficits. Extremities: TTP in the left hip area   Data Reviewed: I have personally reviewed following labs and imaging studies  CBC:  Recent Labs Lab 03/18/17 0808 03/19/17 0255  WBC 6.3 9.9  NEUTROABS 4.7  --   HGB 13.0 13.0  HCT 38.9* 38.5*  MCV 94.9 95.1  PLT 137* 591*   Basic Metabolic Panel:  Recent Labs Lab 03/18/17 0808 03/19/17 0255 03/20/17 1012  NA 136 136 136  K 3.9 4.0 4.6  CL 103 105 104  CO2 24 24 22   GLUCOSE 151* 142* 132*  BUN 15 18 21*  CREATININE 0.85 0.91 1.04  CALCIUM 9.3 8.8* 9.1  MG  --   --  1.9   Liver Function Tests:  Recent Labs Lab 03/18/17 0808  AST 22  ALT 18  ALKPHOS 110  BILITOT 0.5  PROT 6.6  ALBUMIN 3.8   Cardiac Enzymes:  Recent Labs Lab 03/19/17 0255 03/19/17 0821 03/19/17 1425  TROPONINI <0.03 <0.03 <0.03   HbA1C:  Recent Labs  03/20/17 1012  HGBA1C 6.6*   CBG:  Recent Labs Lab 03/20/17 0643 03/20/17 1219 03/20/17 1653 03/20/17 2051 03/21/17 0738  GLUCAP 155* 152* 215*  159* 146*   Thyroid Function Tests:  Recent Labs  03/20/17 1012  TSH 1.730    Recent Results (from the past 240 hour(s))  Surgical pcr screen     Status: None   Collection Time: 03/19/17  9:12 PM  Result Value Ref Range Status   MRSA, PCR NEGATIVE NEGATIVE Final   Staphylococcus aureus NEGATIVE NEGATIVE Final    Comment: (NOTE) The Xpert SA Assay (FDA approved for NASAL specimens in patients 78 years of age and older), is one component of a comprehensive surveillance program. It is not intended to diagnose infection nor to guide or monitor treatment.      Radiology Studies: Dg Chest Port 1 View  Result Date: 03/20/2017 CLINICAL DATA:  Fever EXAM: PORTABLE CHEST 1 VIEW COMPARISON:  11/23/2015 FINDINGS: Prior CABG. Heart is borderline in size. No confluent opacities, effusions or edema. No acute bony abnormality. IMPRESSION: No active disease. Electronically Signed   By: Rolm Baptise M.D.   On: 03/20/2017 09:48    Scheduled Meds: . ALPRAZolam  0.25 mg Oral BID  . aspirin EC  81 mg Oral Daily  . atorvastatin  40 mg Oral q1800  . [START ON 03/23/2017] chlorhexidine  60 mL Topical Once  . enoxaparin (LOVENOX) injection  40 mg Subcutaneous Q24H  . feeding supplement (GLUCERNA SHAKE)  237 mL Oral Q1500  . finasteride  5 mg Oral Daily  . fludrocortisone  0.1 mg Oral Daily  . insulin aspart  0-15 Units Subcutaneous TID WC  . insulin aspart  0-5 Units Subcutaneous QHS  . insulin glargine  10 Units Subcutaneous QHS  . levothyroxine  50 mcg Oral QAC breakfast  . pantoprazole  40 mg Oral Daily  . PARoxetine  20 mg Oral Daily  . potassium chloride SA  40 mEq Oral BID  . [START ON 03/23/2017] povidone-iodine  2 application Topical Once  . tamsulosin  0.4 mg Oral Daily   Continuous Infusions: . [START ON 03/23/2017]  ceFAZolin (ANCEF) IV       LOS: 3 days   Time spent: 25 minutes  Leisa Lenz, MD [facility] Cell phone [phone]  If 7PM-7AM, please contact  night-coverage [email or web address] Password [email or web address] 03/21/2017, 8:30 AM

## 2017-03-21 NOTE — Progress Notes (Signed)
Subjective:   Procedure(s) (LRB): ANTERIOR APPROACH HEMI HIP ARTHROPLASTY (Left)  Patient reports pain as mild to moderate.  Tolerating POs well.  Admits to L LE pain with movement.  No new concerns.   Objective:   VITALS:  Temp:  [98.1 F (36.7 C)-98.2 F (36.8 C)] 98.1 F (36.7 C) (09/08 0744) Pulse Rate:  [83-97] 83 (09/08 0744) BP: (125-131)/(51-69) 131/69 (09/08 0744) SpO2:  [97 %] 97 % (09/08 0744)  General: WDWN patient in NAD. Psych:  Appropriate mood and affect. Neuro:  A&O x 3, Moving all extremities, sensation intact to light touch HEENT:  EOMs intact Chest:  Even non-labored respirations Skin:  C/D/I, no rashes or lesions Extremities: warm/dry, mild edema, no erythema or echymosis.  No lymphadenopathy. Pulses: Popliteus 2+ MSK:  ROM: full ankle ROM, MMT: patient is able to perform quad set, (-) Homan's    LABS  Recent Labs  03/19/17 0255  HGB 13.0  WBC 9.9  PLT 146*    Recent Labs  03/19/17 0255 03/20/17 1012  NA 136 136  K 4.0 4.6  CL 105 104  CO2 24 22  BUN 18 21*  CREATININE 0.91 1.04  GLUCOSE 142* 132*   No results for input(s): LABPT, INR in the last 72 hours.   Assessment/Plan:   Procedure(s) (LRB): ANTERIOR APPROACH HEMI HIP ARTHROPLASTY (Left)  Patient seen in rounds for Dr. Lyla Glassing Plan to OR on Monday with Dr. Lyla Glassing NPO after midnight on Sunday Hold blood thinners. NWB L LE  Mechele Claude, [facility] Office:  [phone]

## 2017-03-21 NOTE — Clinical Social Work Note (Signed)
Clinical Social Work Assessment  Patient Details  Name: Jeffrey [name] MRN: [id number] Date of Birth: 1928/12/25  Date of referral:  03/21/17               Reason for consult:  Discharge Planning                Permission sought to share information with:  Case Manager, Facility Sport and exercise psychologist, Family Supports Permission granted to share information::  Yes, Verbal Permission Granted  Name::        Agency::  [facility] ([facility])  Relationship::  Daughter Dispensing optician Information:     Housing/Transportation Living arrangements for the past 2 months:  [place] of Information:  Patient, Medical Team, Tourist information centre manager, Facility Patient Interpreter Needed:  None Criminal Activity/Legal Involvement Pertinent to Current Situation/Hospitalization:  No - Comment as needed Significant Relationships:  Adult Children, Other Family Members, Community Support Lives with:  Self Do you feel safe going back to the place where you live?  Yes Need for family participation in patient care:  Yes (Comment)  Care giving concerns:  Patient admitted from long care facility: [facility]. Patient had a fall at facility, brought to [facility] for evaluation and found to have hip fx. Patient transported to [facility] for surgery. Patient to have surgery per notes Monday.  Plan: return to SNF when medically stable. Will continue will discharge needs and follow    Social Worker assessment / plan:  Assessment completed. Spoke with facility who is aware of patient admission, discussed with patient and family for return and all agreeable at this time. Patient will require FL2 to be updated and PT consult. DC back to SNF   Employment status:  Retired Forensic scientist:  Medicare, Medicaid In [place] PT Recommendations:  Not assessed at this time [place] / Referral to community resources:  [place]  Patient/Family's Response to  care:  Understanding  Patient/Family's Understanding of and Emotional Response to Diagnosis, Current Treatment, and Prognosis:  Patient agreeable to plan, reports he fell at nursing facility.  No concerns with treatment or understanding prognosis or surgery to be planned.  Emotional Assessment Appearance:  Appears stated age Attitude/Demeanor/Rapport:    Affect (typically observed):  Accepting, Adaptable, Pleasant Orientation:  Oriented to Self, Oriented to Place Alcohol / Substance use:  Not Applicable Psych involvement (Current and /or in the community):  No (Comment)  Discharge Needs  Concerns to be addressed:  No discharge needs identified Readmission within the last 30 days:  No Current discharge risk:  None Barriers to Discharge:  No Barriers Identified   [place], LCSW 03/21/2017, 11:42 AM

## 2017-03-22 ENCOUNTER — Encounter (HOSPITAL_COMMUNITY): Payer: Self-pay | Admitting: *Deleted

## 2017-03-22 DIAGNOSIS — S72002S Fracture of unspecified part of neck of left femur, sequela: Secondary | ICD-10-CM

## 2017-03-22 LAB — BASIC METABOLIC PANEL
ANION GAP: 8 (ref 5–15)
BUN: 17 mg/dL (ref 6–20)
CALCIUM: 8.7 mg/dL — AB (ref 8.9–10.3)
CO2: 23 mmol/L (ref 22–32)
Chloride: 106 mmol/L (ref 101–111)
Creatinine, Ser: 0.8 mg/dL (ref 0.61–1.24)
Glucose, Bld: 167 mg/dL — ABNORMAL HIGH (ref 65–99)
Potassium: 4.4 mmol/L (ref 3.5–5.1)
Sodium: 137 mmol/L (ref 135–145)

## 2017-03-22 LAB — GLUCOSE, CAPILLARY
GLUCOSE-CAPILLARY: 161 mg/dL — AB (ref 65–99)
Glucose-Capillary: 163 mg/dL — ABNORMAL HIGH (ref 65–99)
Glucose-Capillary: 159 mg/dL — ABNORMAL HIGH (ref 65–99)
Glucose-Capillary: 173 mg/dL — ABNORMAL HIGH (ref 65–99)

## 2017-03-22 LAB — CBC
HCT: 37.8 % — ABNORMAL LOW (ref 39.0–52.0)
Hemoglobin: 12.4 g/dL — ABNORMAL LOW (ref 13.0–17.0)
MCH: 31.1 pg (ref 26.0–34.0)
MCHC: 32.8 g/dL (ref 30.0–36.0)
MCV: 94.7 fL (ref 78.0–100.0)
PLATELETS: 146 10*3/uL — AB (ref 150–400)
RBC: 3.99 MIL/uL — ABNORMAL LOW (ref 4.22–5.81)
RDW: 12.7 % (ref 11.5–15.5)
WBC: 6.6 10*3/uL (ref 4.0–10.5)

## 2017-03-22 LAB — URINE CULTURE

## 2017-03-22 MED ORDER — ENOXAPARIN SODIUM 30 MG/0.3ML ~~LOC~~ SOLN: 30.0000 mg | SUBCUTANEOUS | Status: DC

## 2017-03-22 MED ORDER — ENOXAPARIN SODIUM 40 MG/0.4ML ~~LOC~~ SOLN: 40.0000 mg | Freq: Once | SUBCUTANEOUS | Status: DC

## 2017-03-22 NOTE — Progress Notes (Signed)
Patient ID: Jeffrey [name], male   DOB: 1928-12-28, 83 y.o.   MRN: [id number] Subjective:    Patient reports pain as moderate.  No acute events.  Awaiting surgery  Objective:   VITALS:   Vitals:   03/21/17 1956 03/22/17 0540  BP: 130/64 137/65  Pulse: 84 74  Resp:    Temp: 98 F (36.7 C) 98.4 F (36.9 C)  SpO2: 98% 96%    Neurovascular intact  LABS  Recent Labs  03/21/17 0659 03/22/17 0317  HGB 12.6* 12.4*  HCT 38.4* 37.8*  WBC 8.2 6.6  PLT 138* 146*     Recent Labs  03/20/17 1012 03/22/17 0317  NA 136 137  K 4.6 4.4  BUN 21* 17  CREATININE 1.04 0.80  GLUCOSE 132* 167*    No results for input(s): LABPT, INR in the last 72 hours.   Assessment/Plan:  Left displaced femoral neck fracture Plan is to go to OR tomorrow for arthroplasty with Swintec NPO after midnight Consent for exact procedure per Swintec

## 2017-03-22 NOTE — Progress Notes (Signed)
Patient ID: Jeffrey [name], male   DOB: 04/27/1929, 83 y.o.   MRN: [id number]   PROGRESS NOTE    Jeffrey [name]  AST:[id number] DOB: 08/04/1928 DOA: 03/18/2017  PCP: Jani Gravel, MD   Brief Narrative:  Pt is 83 yo male who was transferred from [facility] to [facility] after an episode of mechanical fall that resulted in left hip fracture.   Assessment & Plan:  Left femoral neck fracture - since pt was on Brilinta, this will need to be held for at least 5 days prior to surgery - Plan for surgery tomorrow per ortho  - Continue pain management efforts   Fevers on admission - up to 100.6 F on 9/06 while at [facility] - no fever in past 48 hours  - Blood cx negative  - Urine cx with insignificant growth   Coronary artery disease - Stable - Continue ASA, Coreg, Lipitor  Hyperlipidemia - Continue Lipitor   Hypertension, essential  - Continue coreg   Hypothyroidism - Continue synthroid   Insulin dependent Diabetes - Continue current insulin regimen  - CBG's: 152, 163, 161  Cervical spine stenosis - Noted on C-spine CT ordered on admission. - Also on this CT there was noted an irregularity of the C3 vertebral body, ? discitis. - after femoral neck fracture repair, can proceed with C-spine MRI for clearer evaluation - Continue current pain management   Thrombocytopenia - Monitor daily CBC - D/C Lovenox if platelets drop to less than 100  DVT prophylaxis: Lovenox subQ Code Status: Full Family Communication: no family at the bedside  Disposition Plan: surgery tomorrow    Consultants:   Ortho  Procedures:   None   Antimicrobials:   None   Subjective: No overnight events.  Objective: Vitals:   03/21/17 1300 03/21/17 1956 03/22/17 0540 03/22/17 1352  BP: 111/81 130/64 137/65 (!) 142/68  Pulse: 78 84 74 78  Resp:    18  Temp: 98 F (36.7 C) 98 F (36.7 C) 98.4 F (36.9 C) 97.8 F (36.6 C)  TempSrc: Oral Oral Oral Oral  SpO2: 98% 98% 96% 99%    Weight:      Height:        Intake/Output Summary (Last 24 hours) at 03/22/17 1525 Last data filed at 03/22/17 1355  Gross per 24 hour  Intake                0 ml  Output             1050 ml  Net            -1050 ml   Filed Weights   03/18/17 0731 03/19/17 2100  Weight: 80.7 kg (178 lb) 77.5 kg (170 lb 12.8 oz)    Physical Exam  Constitutional: Appears well-developed and well-nourished. No distress.  Neck: Normal ROM. Neck supple. No JVD. No tracheal deviation. No thyromegaly.  CVS: RRR, S1/S2 +, no murmurs, no gallops, no carotid bruit.  Pulmonary: Effort and breath sounds normal, no stridor Abdominal: Soft. BS +,  no distension, tenderness, rebound or guarding.  Musculoskeletal: left leg pain, palpable pulses  Neuro: Alert. No cranial nerve deficit. Skin: Skin is warm and dry.  Psychiatric: Normal mood and affect.      Data Reviewed: I have personally reviewed following labs and imaging studies  CBC:  Recent Labs Lab 03/18/17 0808 03/19/17 0255 03/21/17 0659 03/22/17 0317  WBC 6.3 9.9 8.2 6.6  NEUTROABS 4.7  --   --   --  HGB 13.0 13.0 12.6* 12.4*  HCT 38.9* 38.5* 38.4* 37.8*  MCV 94.9 95.1 95.0 94.7  PLT 137* 146* 138* 229*   Basic Metabolic Panel:  Recent Labs Lab 03/18/17 0808 03/19/17 0255 03/20/17 1012 03/22/17 0317  NA 136 136 136 137  K 3.9 4.0 4.6 4.4  CL 103 105 104 106  CO2 24 24 22 23   GLUCOSE 151* 142* 132* 167*  BUN 15 18 21* 17  CREATININE 0.85 0.91 1.04 0.80  CALCIUM 9.3 8.8* 9.1 8.7*  MG  --   --  1.9  --    Liver Function Tests:  Recent Labs Lab 03/18/17 0808  AST 22  ALT 18  ALKPHOS 110  BILITOT 0.5  PROT 6.6  ALBUMIN 3.8   Cardiac Enzymes:  Recent Labs Lab 03/19/17 0255 03/19/17 0821 03/19/17 1425  TROPONINI <0.03 <0.03 <0.03   HbA1C:  Recent Labs  03/20/17 1012  HGBA1C 6.6*   CBG:  Recent Labs Lab 03/21/17 1146 03/21/17 1645 03/21/17 1959 03/22/17 0546 03/22/17 1311  GLUCAP 155* 147* 152*  163* 161*   Thyroid Function Tests:  Recent Labs  03/20/17 1012  TSH 1.730    Recent Results (from the past 240 hour(s))  Surgical pcr screen     Status: None   Collection Time: 03/19/17  9:12 PM  Result Value Ref Range Status   MRSA, PCR NEGATIVE NEGATIVE Final   Staphylococcus aureus NEGATIVE NEGATIVE Final    Comment: (NOTE) The Xpert SA Assay (FDA approved for NASAL specimens in patients 97 years of age and older), is one component of a comprehensive surveillance program. It is not intended to diagnose infection nor to guide or monitor treatment.   Culture, blood (routine x 2)     Status: None (Preliminary result)   Collection Time: 03/20/17 10:12 AM  Result Value Ref Range Status   Specimen Description BLOOD RIGHT ANTECUBITAL  Final   Special Requests   Final    BOTTLES DRAWN AEROBIC AND ANAEROBIC Blood Culture adequate volume   Culture NO GROWTH 2 DAYS  Final   Report Status PENDING  Incomplete  Culture, blood (routine x 2)     Status: None (Preliminary result)   Collection Time: 03/20/17 10:12 AM  Result Value Ref Range Status   Specimen Description BLOOD LEFT ANTECUBITAL  Final   Special Requests   Final    BOTTLES DRAWN AEROBIC AND ANAEROBIC Blood Culture adequate volume   Culture NO GROWTH 2 DAYS  Final   Report Status PENDING  Incomplete  Urine Culture     Status: Abnormal   Collection Time: 03/21/17 12:08 PM  Result Value Ref Range Status   Specimen Description URINE, RANDOM  Final   Special Requests NONE  Final   Culture <10,000 COLONIES/mL INSIGNIFICANT GROWTH (A)  Final   Report Status 03/22/2017 FINAL  Final     Radiology Studies: No results found.  Scheduled Meds: . ALPRAZolam  0.25 mg Oral BID  . aspirin EC  81 mg Oral Daily  . atorvastatin  40 mg Oral q1800  . [START ON 03/23/2017] chlorhexidine  60 mL Topical Once  . enoxaparin (LOVENOX) injection  40 mg Subcutaneous Q24H  . feeding supplement (GLUCERNA SHAKE)  237 mL Oral Q1500  .  finasteride  5 mg Oral Daily  . fludrocortisone  0.1 mg Oral Daily  . insulin aspart  0-15 Units Subcutaneous TID WC  . insulin aspart  0-5 Units Subcutaneous QHS  . insulin glargine  10 Units Subcutaneous  QHS  . levothyroxine  50 mcg Oral QAC breakfast  . pantoprazole  40 mg Oral Daily  . PARoxetine  20 mg Oral Daily  . potassium chloride SA  40 mEq Oral BID  . [START ON 03/23/2017] povidone-iodine  2 application Topical Once  . tamsulosin  0.4 mg Oral Daily   Continuous Infusions: . [START ON 03/23/2017]  ceFAZolin (ANCEF) IV       LOS: 4 days   Time spent: 25 minutes  Leisa Lenz, MD [facility] Cell phone [phone]  If 7PM-7AM, please contact night-coverage [email or web address] Password [email or web address] 03/22/2017, 3:25 PM

## 2017-03-23 ENCOUNTER — Inpatient Hospital Stay (HOSPITAL_COMMUNITY): Payer: Medicare Other

## 2017-03-23 ENCOUNTER — Inpatient Hospital Stay (HOSPITAL_COMMUNITY): Payer: Medicare Other | Admitting: Anesthesiology

## 2017-03-23 ENCOUNTER — Encounter (HOSPITAL_COMMUNITY): Payer: Self-pay | Admitting: Anesthesiology

## 2017-03-23 ENCOUNTER — Encounter (HOSPITAL_COMMUNITY)
Admission: EM | Disposition: A | Discharge status: Skilled Nursing Facility | Payer: Self-pay | Source: Home / Self Care | Attending: Internal Medicine

## 2017-03-23 DIAGNOSIS — S72002A Fracture of unspecified part of neck of left femur, initial encounter for closed fracture: Secondary | ICD-10-CM | POA: Diagnosis present

## 2017-03-23 HISTORY — PX: ANTERIOR APPROACH HEMI HIP ARTHROPLASTY: SHX6690

## 2017-03-23 LAB — BASIC METABOLIC PANEL
Anion gap: 8 (ref 5–15)
BUN: 14 mg/dL (ref 6–20)
CHLORIDE: 100 mmol/L — AB (ref 101–111)
CO2: 25 mmol/L (ref 22–32)
Calcium: 8.6 mg/dL — ABNORMAL LOW (ref 8.9–10.3)
Creatinine, Ser: 0.79 mg/dL (ref 0.61–1.24)
GFR calc non Af Amer: >60 mL/min (ref >60)
Glucose, Bld: 164 mg/dL — ABNORMAL HIGH (ref 65–99)
POTASSIUM: 4.2 mmol/L (ref 3.5–5.1)
SODIUM: 133 mmol/L — AB (ref 135–145)

## 2017-03-23 LAB — CBC
HCT: 39 % (ref 39.0–52.0)
HEMOGLOBIN: 12.9 g/dL — AB (ref 13.0–17.0)
MCH: 31.1 pg (ref 26.0–34.0)
MCHC: 33.1 g/dL (ref 30.0–36.0)
MCV: 94 fL (ref 78.0–100.0)
Platelets: 156 10*3/uL (ref 150–400)
RBC: 4.15 MIL/uL — AB (ref 4.22–5.81)
RDW: 12.5 % (ref 11.5–15.5)
WBC: 5.7 10*3/uL (ref 4.0–10.5)

## 2017-03-23 LAB — GLUCOSE, CAPILLARY
GLUCOSE-CAPILLARY: 162 mg/dL — AB (ref 65–99)
Glucose-Capillary: 138 mg/dL — ABNORMAL HIGH (ref 65–99)
Glucose-Capillary: 171 mg/dL — ABNORMAL HIGH (ref 65–99)
Glucose-Capillary: 155 mg/dL — ABNORMAL HIGH (ref 65–99)

## 2017-03-23 SURGERY — HEMIARTHROPLASTY, HIP, DIRECT ANTERIOR APPROACH, FOR FRACTURE
Anesthesia: General | Laterality: Left

## 2017-03-23 MED ORDER — LACTATED RINGERS IV SOLN
INTRAVENOUS | Status: DC | PRN
  Administered 2017-03-23 (×3): via INTRAVENOUS

## 2017-03-23 MED ORDER — CEFAZOLIN SODIUM-DEXTROSE 2-4 GM/100ML-% IV SOLN
2.0000 g | Freq: Four times a day (QID) | INTRAVENOUS | Status: AC
  Administered 2017-03-23 – 2017-03-24 (×2): 2 g via INTRAVENOUS
  Filled 2017-03-23 (×2): qty 100

## 2017-03-23 MED ORDER — FENTANYL CITRATE (PF) 100 MCG/2ML IJ SOLN
INTRAMUSCULAR | Status: DC | PRN
  Administered 2017-03-23: 150 ug via INTRAVENOUS
  Administered 2017-03-23: 100 ug via INTRAVENOUS

## 2017-03-23 MED ORDER — METOCLOPRAMIDE HCL 5 MG PO TABS: 5.0000 mg | ORAL_TABLET | Freq: Three times a day (TID) | ORAL | Status: DC | PRN

## 2017-03-23 MED ORDER — TICAGRELOR 60 MG PO TABS
60.0000 mg | ORAL_TABLET | Freq: Every day | ORAL | Status: DC
  Administered 2017-03-24 – 2017-03-25 (×2): 60 mg via ORAL
  Filled 2017-03-23 (×2): qty 1

## 2017-03-23 MED ORDER — MENTHOL 3 MG MT LOZG: 1.0000 | LOZENGE | OROMUCOSAL | Status: DC | PRN

## 2017-03-23 MED ORDER — ACETAMINOPHEN 650 MG RE SUPP: 650.0000 mg | Freq: Four times a day (QID) | RECTAL | Status: DC | PRN

## 2017-03-23 MED ORDER — ACETAMINOPHEN 10 MG/ML IV SOLN
1000.0000 mg | Freq: Once | INTRAVENOUS | Status: DC | PRN
  Administered 2017-03-23: 1000 mg via INTRAVENOUS

## 2017-03-23 MED ORDER — LACTATED RINGERS IV SOLN
INTRAVENOUS | Status: DC
Start: 2017-03-23 — End: 2017-03-25
  Administered 2017-03-23: 13:00:00 via INTRAVENOUS

## 2017-03-23 MED ORDER — 0.9 % SODIUM CHLORIDE (POUR BTL) OPTIME
TOPICAL | Status: DC | PRN
  Administered 2017-03-23: 1000 mL

## 2017-03-23 MED ORDER — SODIUM CHLORIDE 0.9 % IV SOLN
INTRAVENOUS | Status: DC | PRN
  Administered 2017-03-23: 1000 mg via INTRAVENOUS

## 2017-03-23 MED ORDER — MORPHINE SULFATE (PF) 4 MG/ML IV SOLN: 0.5000 mg | INTRAVENOUS | Status: DC | PRN

## 2017-03-23 MED ORDER — ONDANSETRON HCL 4 MG/2ML IJ SOLN: 4.0000 mg | Freq: Four times a day (QID) | INTRAMUSCULAR | Status: DC | PRN

## 2017-03-23 MED ORDER — PHENOL 1.4 % MT LIQD: 1.0000 | OROMUCOSAL | Status: DC | PRN

## 2017-03-23 MED ORDER — FENTANYL CITRATE (PF) 100 MCG/2ML IJ SOLN
25.0000 ug | INTRAMUSCULAR | Status: DC | PRN
  Administered 2017-03-23 (×2): 50 ug via INTRAVENOUS

## 2017-03-23 MED ORDER — PROPOFOL 10 MG/ML IV BOLUS
INTRAVENOUS | Status: DC | PRN
  Administered 2017-03-23: 10 mg via INTRAVENOUS

## 2017-03-23 MED ORDER — ACETAMINOPHEN 325 MG PO TABS
650.0000 mg | ORAL_TABLET | Freq: Four times a day (QID) | ORAL | Status: DC | PRN
Start: 2017-03-23 — End: 2017-03-25
  Administered 2017-03-24 – 2017-03-25 (×4): 650 mg via ORAL
  Filled 2017-03-23 (×4): qty 2

## 2017-03-23 MED ORDER — ONDANSETRON HCL 4 MG/2ML IJ SOLN
INTRAMUSCULAR | Status: DC | PRN
  Administered 2017-03-23: 4 mg via INTRAVENOUS

## 2017-03-23 MED ORDER — CEFAZOLIN SODIUM-DEXTROSE 2-4 GM/100ML-% IV SOLN
2.0000 g | INTRAVENOUS | Status: DC
  Filled 2017-03-23 (×2): qty 100

## 2017-03-23 MED ORDER — TRANEXAMIC ACID 1000 MG/10ML IV SOLN
1000.0000 mg | INTRAVENOUS | Status: AC
  Administered 2017-03-23: 1000 mg via INTRAVENOUS
  Filled 2017-03-23: qty 10
  Filled 2017-03-23: qty 1100

## 2017-03-23 MED ORDER — ALBUMIN HUMAN 5 % IV SOLN
INTRAVENOUS | Status: DC | PRN
  Administered 2017-03-23: 16:00:00 via INTRAVENOUS

## 2017-03-23 MED ORDER — VANCOMYCIN HCL IN DEXTROSE 1-5 GM/200ML-% IV SOLN
1000.0000 mg | INTRAVENOUS | Status: DC
  Filled 2017-03-23: qty 200

## 2017-03-23 MED ORDER — SODIUM CHLORIDE 0.9 % IR SOLN
Status: DC | PRN
  Administered 2017-03-23: 3000 mL

## 2017-03-23 MED ORDER — FENTANYL CITRATE (PF) 100 MCG/2ML IJ SOLN
INTRAMUSCULAR | Status: AC
  Administered 2017-03-23: 50 ug via INTRAVENOUS
  Filled 2017-03-23: qty 2

## 2017-03-23 MED ORDER — FENTANYL CITRATE (PF) 250 MCG/5ML IJ SOLN
INTRAMUSCULAR | Status: AC
Start: 2017-03-23 — End: 2017-03-23
  Filled 2017-03-23: qty 5

## 2017-03-23 MED ORDER — BUPIVACAINE-EPINEPHRINE (PF) 0.5% -1:200000 IJ SOLN
INTRAMUSCULAR | Status: DC | PRN
  Administered 2017-03-23: 30 mL

## 2017-03-23 MED ORDER — SODIUM CHLORIDE 0.9 % IV SOLN
INTRAVENOUS | Status: AC | PRN
  Administered 2017-03-23: 1000 mL

## 2017-03-23 MED ORDER — MEPERIDINE HCL 25 MG/ML IJ SOLN: 6.2500 mg | INTRAMUSCULAR | Status: DC | PRN

## 2017-03-23 MED ORDER — HYDROCODONE-ACETAMINOPHEN 5-325 MG PO TABS
1.0000 | ORAL_TABLET | Freq: Four times a day (QID) | ORAL | Status: DC | PRN
  Administered 2017-03-24: 2 via ORAL
  Filled 2017-03-23: qty 2

## 2017-03-23 MED ORDER — ONDANSETRON HCL 4 MG/2ML IJ SOLN: 4.0000 mg | Freq: Once | INTRAMUSCULAR | Status: DC | PRN

## 2017-03-23 MED ORDER — METOCLOPRAMIDE HCL 5 MG/ML IJ SOLN: 5.0000 mg | Freq: Three times a day (TID) | INTRAMUSCULAR | Status: DC | PRN

## 2017-03-23 MED ORDER — ACETAMINOPHEN 10 MG/ML IV SOLN: 1000.0000 mg | INTRAVENOUS | Status: DC

## 2017-03-23 MED ORDER — ROCURONIUM BROMIDE 100 MG/10ML IV SOLN
INTRAVENOUS | Status: DC | PRN
  Administered 2017-03-23: 50 mg via INTRAVENOUS
  Administered 2017-03-23: 10 mg via INTRAVENOUS

## 2017-03-23 MED ORDER — SUGAMMADEX SODIUM 200 MG/2ML IV SOLN
INTRAVENOUS | Status: DC | PRN
  Administered 2017-03-23: 155 mg via INTRAVENOUS

## 2017-03-23 MED ORDER — ONDANSETRON HCL 4 MG PO TABS
4.0000 mg | ORAL_TABLET | Freq: Four times a day (QID) | ORAL | Status: DC | PRN
  Administered 2017-03-24: 4 mg via ORAL
  Filled 2017-03-23: qty 1

## 2017-03-23 MED ORDER — KETOROLAC TROMETHAMINE 30 MG/ML IJ SOLN
INTRAMUSCULAR | Status: AC
  Filled 2017-03-23: qty 1

## 2017-03-23 MED ORDER — SODIUM CHLORIDE 0.9 % IJ SOLN
INTRAMUSCULAR | Status: DC | PRN
  Administered 2017-03-23: 30 mL via INTRAVENOUS

## 2017-03-23 MED ORDER — BUPIVACAINE-EPINEPHRINE (PF) 0.5% -1:200000 IJ SOLN
INTRAMUSCULAR | Status: AC
  Filled 2017-03-23: qty 30

## 2017-03-23 MED ORDER — LIDOCAINE HCL (CARDIAC) 20 MG/ML IV SOLN
INTRAVENOUS | Status: DC | PRN
  Administered 2017-03-23: 50 mg via INTRAVENOUS

## 2017-03-23 MED ORDER — PROPOFOL 10 MG/ML IV BOLUS
INTRAVENOUS | Status: AC
  Filled 2017-03-23: qty 20

## 2017-03-23 MED ORDER — KETOROLAC TROMETHAMINE 30 MG/ML IJ SOLN
INTRAMUSCULAR | Status: DC | PRN
  Administered 2017-03-23: 30 mg via INTRAMUSCULAR

## 2017-03-23 SURGICAL SUPPLY — 61 items
BLADE CLIPPER SURG (BLADE) IMPLANT
BLADE SAW SGTL 18X1.27X75 (BLADE) ×2 IMPLANT
BLADE SAW SGTL 18X1.27X75MM (BLADE) ×1
CAPT HIP HEMI 2 ×3 IMPLANT
CHLORAPREP W/TINT 26ML (MISCELLANEOUS) ×3 IMPLANT
COVER SURGICAL LIGHT HANDLE (MISCELLANEOUS) ×3 IMPLANT
DERMABOND ADVANCED (GAUZE/BANDAGES/DRESSINGS) ×2
DERMABOND ADVANCED .7 DNX12 (GAUZE/BANDAGES/DRESSINGS) ×1 IMPLANT
DRAPE C-ARM 42X72 X-RAY (DRAPES) ×3 IMPLANT
DRAPE IMP U-DRAPE 54X76 (DRAPES) ×6 IMPLANT
DRAPE STERI IOBAN 125X83 (DRAPES) ×3 IMPLANT
DRAPE U-SHAPE 47X51 STRL (DRAPES) ×9 IMPLANT
DRSG AQUACEL AG ADV 3.5X10 (GAUZE/BANDAGES/DRESSINGS) ×3 IMPLANT
ELECT BLADE 4.0 EZ CLEAN MEGAD (MISCELLANEOUS) ×3
ELECT REM PT RETURN 9FT ADLT (ELECTROSURGICAL) ×3
ELECTRODE BLDE 4.0 EZ CLN MEGD (MISCELLANEOUS) ×1 IMPLANT
ELECTRODE REM PT RTRN 9FT ADLT (ELECTROSURGICAL) ×1 IMPLANT
EVACUATOR 1/8 PVC DRAIN (DRAIN) IMPLANT
GLOVE BIO SURGEON STRL SZ8.5 (GLOVE) ×6 IMPLANT
GLOVE BIOGEL PI IND STRL 6.5 (GLOVE) ×1 IMPLANT
GLOVE BIOGEL PI IND STRL 8 (GLOVE) ×1 IMPLANT
GLOVE BIOGEL PI IND STRL 8.5 (GLOVE) ×1 IMPLANT
GLOVE BIOGEL PI INDICATOR 6.5 (GLOVE) ×2
GLOVE BIOGEL PI INDICATOR 8 (GLOVE) ×2
GLOVE BIOGEL PI INDICATOR 8.5 (GLOVE) ×2
GLOVE SURG SS PI 6.5 STRL IVOR (GLOVE) ×3 IMPLANT
GOWN STRL REUS W/ TWL LRG LVL3 (GOWN DISPOSABLE) ×2 IMPLANT
GOWN STRL REUS W/TWL 2XL LVL3 (GOWN DISPOSABLE) ×3 IMPLANT
GOWN STRL REUS W/TWL LRG LVL3 (GOWN DISPOSABLE) ×4
HANDPIECE INTERPULSE COAX TIP (DISPOSABLE) ×2
HOOD PEEL AWAY FACE SHEILD DIS (HOOD) ×6 IMPLANT
HOOD PEEL AWAY FLYTE STAYCOOL (MISCELLANEOUS) ×3 IMPLANT
KIT BASIN OR (CUSTOM PROCEDURE TRAY) ×3 IMPLANT
KIT ROOM TURNOVER OR (KITS) ×3 IMPLANT
MANIFOLD NEPTUNE II (INSTRUMENTS) ×3 IMPLANT
MARKER SKIN DUAL TIP RULER LAB (MISCELLANEOUS) ×3 IMPLANT
NEEDLE 18GX1X1/2 (RX/OR ONLY) (NEEDLE) ×3 IMPLANT
NEEDLE SPNL 18GX3.5 QUINCKE PK (NEEDLE) ×3 IMPLANT
NS IRRIG 1000ML POUR BTL (IV SOLUTION) ×3 IMPLANT
PACK TOTAL JOINT (CUSTOM PROCEDURE TRAY) ×3 IMPLANT
PACK UNIVERSAL I (CUSTOM PROCEDURE TRAY) ×3 IMPLANT
PAD ARMBOARD 7.5X6 YLW CONV (MISCELLANEOUS) ×6 IMPLANT
SEALER BIPOLAR AQUA 6.0 (INSTRUMENTS) IMPLANT
SET HNDPC FAN SPRY TIP SCT (DISPOSABLE) ×1 IMPLANT
SLEEVE CABLE 2MM VT (Orthopedic Implant) ×6 IMPLANT
SUCTION FRAZIER HANDLE 10FR (MISCELLANEOUS) ×2
SUCTION TUBE FRAZIER 10FR DISP (MISCELLANEOUS) ×1 IMPLANT
SUT ETHIBOND NAB CT1 #1 30IN (SUTURE) ×6 IMPLANT
SUT MNCRL AB 3-0 PS2 18 (SUTURE) ×3 IMPLANT
SUT MON AB 2-0 CT1 36 (SUTURE) ×6 IMPLANT
SUT VIC AB 1 CT1 27 (SUTURE) ×2
SUT VIC AB 1 CT1 27XBRD ANBCTR (SUTURE) ×1 IMPLANT
SUT VIC AB 2-0 CT1 27 (SUTURE) ×4
SUT VIC AB 2-0 CT1 TAPERPNT 27 (SUTURE) ×2 IMPLANT
SUT VLOC 180 0 24IN GS25 (SUTURE) ×3 IMPLANT
SYR 50ML LL SCALE MARK (SYRINGE) ×3 IMPLANT
SYRINGE 1CC SLIP TB (MISCELLANEOUS) ×3 IMPLANT
TOWEL OR 17X24 6PK STRL BLUE (TOWEL DISPOSABLE) ×3 IMPLANT
TOWEL OR 17X26 10 PK STRL BLUE (TOWEL DISPOSABLE) ×3 IMPLANT
TRAY FOLEY CATH SILVER 16FR (SET/KITS/TRAYS/PACK) ×3 IMPLANT
WATER STERILE IRR 1000ML POUR (IV SOLUTION) ×9 IMPLANT

## 2017-03-23 NOTE — H&P (View-Only) (Signed)
ORTHOPAEDIC CONSULTATION  REQUESTING PHYSICIAN: Florencia Reasons, MD  PCP:  Jani Gravel, MD  Chief Complaint: left hip pain.  HPI: Jeffrey [name] is a 83 y.o. male who complains of  Left hip pain after he fell on 03/18/2017. He was taken to the emergency department at [facility], where x-rays revealed a displaced left femoral neck fracture. He was evaluated by Dr. Arther Abbott, who recommended transfer to [facility]. I was asked to see the patient by my partner, Dr. Stann Mainland.he was admitted to the hospitalist service. The patient takes Brillinta, so we are waiting 5 days to proceed with surgery.  Past Medical History:  Diagnosis Date  . Anxiety about health    "multiple somatic complaints"  . Benign prostatic hypertrophy    History of urinary retention  . Cervical vertebral fracture ([place])   . CHF (congestive heart failure) ([place])   . Chronic back pain   . Chronic chest pain   . Coronary atherosclerosis of native coronary artery    a. CABG x 4 in 1989 (VG->OM1->OM2, VG->RCA, LIMA->LAD), b. 05/2010: DES to VG-OM1/OM2, DES to distal LCx. c. NSTEMI in 04/2011 - TO distal LCX stent and VG->OM2. d. 02/2012 NSTEMI DES to VG-OM1/continuation to OM2 occluded. e. inferior STEMI s/p DES to SVG-RAMUS 06/2012. f. inferolat STEMI 09/2012 s/p DES to SVG-interm; g. Lex MV (11/14):  EF 35%, inf-lat scar with small peri-infarct ischemia  . Diabetes mellitus, type II ([place])   . Dizziness and giddiness   . Dysphagia   . Essential hypertension, benign   . Gastroesophageal reflux disease   . GERD (gastroesophageal reflux disease)   . History of pneumonia   . History of stroke   . Hyperlipidemia   . Hyperlipidemia   . Hypothyroidism   . Ischemic cardiomyopathy Nov 2015   EF 35% cath, 45-50% by echo  . MI, acute, non ST segment elevation ([place])   . Osteoarthritis   . Peptic ulcer   . Peptic ulcer disease   . ST elevation (STEMI) myocardial infarction ([place])   . Stroke ([place])   . Syncope and collapse     Past Surgical History:  Procedure Laterality Date  . CARDIAC CATHETERIZATION  05/19/14   SVG-OM occl- medical Rx  . CORONARY ANGIOPLASTY  10/12, 8/13, 12/13, 3/14   SVG-OM PCI  . CORONARY ARTERY BYPASS GRAFT  1989  . EGD WITH FOREIGN BODY REMOVAL  08/13/2009   Food impaction  . LEFT HEART CATHETERIZATION WITH CORONARY ANGIOGRAM N/A 11/01/2011   Procedure: LEFT HEART CATHETERIZATION WITH CORONARY ANGIOGRAM;  Surgeon: Lorretta Harp, MD;  Location: [facility] CATH LAB;  Service: Cardiovascular;  Laterality: N/A;  . LEFT HEART CATHETERIZATION WITH CORONARY ANGIOGRAM N/A 06/18/2012   Procedure: LEFT HEART CATHETERIZATION WITH CORONARY ANGIOGRAM;  Surgeon: Peter M Martinique, MD;  Location: [facility] CATH LAB;  Service: Cardiovascular;  Laterality: N/A;  . LEFT HEART CATHETERIZATION WITH CORONARY/GRAFT ANGIOGRAM N/A 03/10/2012   Procedure: LEFT HEART CATHETERIZATION WITH [name];  Surgeon: Sherren Mocha, MD;  Location: [facility] CATH LAB;  Service: Cardiovascular;  Laterality: N/A;  . LEFT HEART CATHETERIZATION WITH CORONARY/GRAFT ANGIOGRAM  10/06/2012   Procedure: LEFT HEART CATHETERIZATION WITH [name];  Surgeon: Burnell Blanks, MD;  Location: [facility] CATH LAB;  Service: Cardiovascular;;  . LEFT HEART CATHETERIZATION WITH CORONARY/GRAFT ANGIOGRAM N/A 11/09/2012   Procedure: LEFT HEART CATHETERIZATION WITH [name];  Surgeon: Peter M Martinique, MD;  Location: [facility] CATH LAB;  Service: Cardiovascular;  Laterality: N/A;  . LEFT HEART CATHETERIZATION WITH  CORONARY/GRAFT ANGIOGRAM N/A 05/19/2014   Procedure: LEFT HEART CATHETERIZATION WITH [name];  Surgeon: Troy Sine, MD;  Location: [facility] CATH LAB;  Service: Cardiovascular;  Laterality: N/A;  . PERCUTANEOUS CORONARY STENT INTERVENTION (PCI-S) N/A 11/01/2011   Procedure: PERCUTANEOUS CORONARY STENT INTERVENTION (PCI-S);  Surgeon: Lorretta Harp, MD;  Location: [facility] CATH LAB;  Service: Cardiovascular;  Laterality:  N/A;  . PERCUTANEOUS CORONARY STENT INTERVENTION (PCI-S)  06/18/2012   Procedure: PERCUTANEOUS CORONARY STENT INTERVENTION (PCI-S);  Surgeon: Peter M Martinique, MD;  Location: [facility] CATH LAB;  Service: Cardiovascular;;  . PERCUTANEOUS CORONARY STENT INTERVENTION (PCI-S)  10/06/2012   Procedure: PERCUTANEOUS CORONARY STENT INTERVENTION (PCI-S);  Surgeon: Burnell Blanks, MD;  Location: [facility] CATH LAB;  Service: Cardiovascular;;  . TONSILLECTOMY     Social History   Social History  . Marital status: Divorced    Spouse name: N/A  . Number of children: N/A  . Years of education: N/A   Occupational History  . Retired     Designer, television/film set   Social History Main Topics  . Smoking status: Former Smoker    Packs/day: 2.00    Years: 10.00    Types: Cigarettes    Start date: 07/14/1950    Quit date: 07/14/1961  . Smokeless tobacco: Former Systems developer    Types: Chew    Quit date: 12/21/2015  . Alcohol use No  . Drug use: No  . Sexual activity: No   Other Topics Concern  . None   Social History Narrative   ** Merged History Encounter **...   He was raised by his uncle.  His mother deceased when   he was 38 year old with appendicitis. Father deceased from an MI at age   50.  He has 1 sister, but he does not know her health status as they   have been separated.          Family History  Problem Relation Age of Onset  . Appendicitis Mother        Pt was 59 year old  . Heart attack Father 36  . Early death Unknown        Parents died young  . Colon cancer Neg Hx    No Known Allergies Prior to Admission medications   Medication Sig Start Date End Date Taking? Authorizing Provider  acetaminophen (TYLENOL) 325 MG tablet Take 2 tablets (650 mg total) by mouth every 4 (four) hours as needed for headache or mild pain. 05/21/14  Yes Kilroy, Luke K, PA-C  albuterol (PROVENTIL HFA;VENTOLIN HFA) 108 (90 BASE) MCG/ACT inhaler Inhale 2 puffs into the lungs every 6 (six) hours as needed for wheezing or shortness of  breath.   Yes [provider]  ALPRAZolam (XANAX) 0.25 MG tablet Take 0.25 mg by mouth 2 (two) times daily.   Yes [provider]  aluminum-magnesium hydroxide-simethicone (MAALOX) 200-200-20 MG/5ML SUSP Take 15 mLs by mouth every 6 (six) hours as needed (indgestion).   Yes [provider]  aspirin EC 81 MG tablet Take 81 mg by mouth daily.   Yes [provider]  atorvastatin (LIPITOR) 40 MG tablet Take 1 tablet (40 mg total) by mouth daily at 6 PM. 05/29/13  Yes Weaver, Scott T, PA-C  finasteride (PROSCAR) 5 MG tablet Take 5 mg by mouth daily.   Yes [provider]  fludrocortisone (FLORINEF) 0.1 MG tablet Take 0.1 mg by mouth daily.   Yes [provider]  Hexylresorcinol (THROAT LOZENGES MT) Use as directed 1  lozenge in the mouth or throat every 4 (four) hours as needed (sore throat).   Yes [provider]  insulin glargine (LANTUS) 100 unit/mL SOPN Inject 10 Units into the skin at bedtime.   Yes [provider]  insulin lispro (HUMALOG) 100 UNIT/ML KwikPen Junior Inject 2-12 Units into the skin 4 (four) times daily. 151-200= 2 units; 201-250= 4 units; 251-300= 6 units; 301-350 = 8 units; 351-400= 10 units; >401 = 12 units and call MD.   Yes [provider]  levothyroxine (SYNTHROID, LEVOTHROID) 50 MCG tablet Take 50 mcg by mouth daily before breakfast. Reported on 10/10/2015 09/08/14  Yes [provider]  loperamide (IMODIUM A-D) 2 MG tablet Take 2 mg by mouth every 6 (six) hours as needed for diarrhea or loose stools.   Yes [provider]  meclizine (ANTIVERT) 25 MG tablet Take 25 mg by mouth every 8 (eight) hours as needed for dizziness.   Yes [provider]  Multiple Vitamin (MULTIVITAMIN WITH MINERALS) TABS Take 1 tablet by mouth daily. Reported on 10/10/2015   Yes [provider]  NITROSTAT 0.4 MG SL tablet PLACE 1 TAB UNDER TONGUE EVERY 5 MIN IF NEEDED FOR CHEST PAIN. MAY USE 3  TIMES.NO RELIEF CALL 911. 07/04/15  Yes Lendon Colonel, NP  pantoprazole (PROTONIX) 40 MG tablet Take 40 mg by mouth daily.   Yes [provider]  PARoxetine (PAXIL) 20 MG tablet Take 20 mg by mouth daily.   Yes [provider]  pioglitazone (ACTOS) 15 MG tablet Take 15 mg by mouth daily.   Yes [provider]  polyethylene glycol powder (GLYCOLAX/MIRALAX) powder Take 1 Container by mouth daily as needed for moderate constipation.    Yes [provider]  potassium chloride SA (K-DUR,KLOR-CON) 20 MEQ tablet Take 40 mEq by mouth 2 (two) times daily.   Yes [provider]  tamsulosin (FLOMAX) 0.4 MG CAPS Take 0.4 mg by mouth daily.  01/27/13  Yes [provider]  ticagrelor (BRILINTA) 60 MG TABS tablet Take 1 tablet (60 mg total) by mouth 2 (two) times daily. Patient taking differently: Take 60 mg by mouth daily.  08/18/16  Yes Herminio Commons, MD   Dg Chest Port 1 View  Result Date: 03/20/2017 CLINICAL DATA:  Fever EXAM: PORTABLE CHEST 1 VIEW COMPARISON:  11/23/2015 FINDINGS: Prior CABG. Heart is borderline in size. No confluent opacities, effusions or edema. No acute bony abnormality. IMPRESSION: No active disease. Electronically Signed   By: Rolm Baptise M.D.   On: 03/20/2017 09:48    Positive ROS: All other systems have been reviewed and were otherwise negative with the exception of those mentioned in the HPI and as above.  Physical Exam: General: Alert, no acute distress Cardiovascular: No pedal edema Respiratory: No cyanosis, no use of accessory musculature GI: No organomegaly, abdomen is soft and non-tender Skin: No lesions in the area of chief complaint Neurologic: Sensation intact distally Psychiatric: Patient is competent for consent with normal mood and affect Lymphatic: No axillary or cervical lymphadenopathy  MUSCULOSKELETAL: examination of the left lower extremity reveals no skin wounds or lesions. He has shortening and  external rotation. He has pain with attempted logrolling of the hip. He has intact plantar flexion and dorsiflexion. He has palpable pulses. He reports intact sensation to light touch.  Assessment: Displaced left femoral neck fracture Significant previous heart history on Brillinta  Plan: I discussed the findings with the patient. I recommended left hip hemiarthroplasty to allow for  mobilization out of bed. We discussed the risks, benefits, and alternatives to surgery. The patient understands and wishes to proceed. Due to the patient's usage of Brillinta, we will plan for surgery on Monday. Nothing by mouth after midnight on Sunday night. Hold chemical DVT prophylaxis. At the patient's request, I called the patient's daughter, Janae Bridgeman, at [phone] however she did not answer.   The risks, benefits, and alternatives were discussed with the patient. There are risks associated with the surgery including, but not limited to, problems with anesthesia (death), infection, instability (giving out of the joint), dislocation, differences in leg length/angulation/rotation, fracture of bones, loosening or failure of implants, hematoma (blood accumulation) which may require surgical drainage, blood clots, pulmonary embolism, nerve injury (foot drop and lateral thigh numbness), and blood vessel injury. The patient understands these risks and elects to proceed.   Nikol Lemar, Horald Pollen, MD Cell [phone]    03/20/2017 1:23 PM

## 2017-03-23 NOTE — Care Management Important Message (Signed)
Important Message  Patient Details  Name: Jeffrey [name] MRN: [id number] Date of Birth: [birth year]   Medicare Important Message Given:  Yes    Jeffrey [name] 03/23/2017, 1:25 PM

## 2017-03-23 NOTE — Anesthesia Procedure Notes (Signed)
Procedure Name: Intubation Date/Time: 03/23/2017 2:57 PM Performed by: Eligha Bridegroom Pre-anesthesia Checklist: Patient identified, Emergency Drugs available, Suction available and Patient being monitored Patient Re-evaluated:Patient Re-evaluated prior to induction Oxygen Delivery Method: Circle system utilized Preoxygenation: Pre-oxygenation with 100% oxygen Induction Type: IV induction Ventilation: Mask ventilation without difficulty Grade View: Grade I Tube type: Oral Tube size: 7.5 mm Airway Equipment and Method: Stylet Placement Confirmation: ETT inserted through vocal cords under direct vision,  positive ETCO2 and breath sounds checked- equal and bilateral Secured at: 22 cm Tube secured with: Tape Dental Injury: Teeth and Oropharynx as per pre-operative assessment

## 2017-03-23 NOTE — Transfer of Care (Signed)
Immediate Anesthesia Transfer of Care Note  Patient: Jeffrey [name]  Procedure(s) Performed: Procedure(s): ANTERIOR APPROACH HEMI HIP ARTHROPLASTY (Left)  Patient Location: PACU  Anesthesia Type:General  Level of Consciousness: awake, alert  and oriented  Airway & Oxygen Therapy: Patient Spontanous Breathing and Patient connected to nasal cannula oxygen  Post-op Assessment: Report given to RN and Post -op Vital signs reviewed and stable  Post vital signs: Reviewed and stable  Last Vitals:  Vitals:   03/22/17 2100 03/23/17 0500  BP: 131/64 120/70  Pulse: 80 80  Resp: 18 18  Temp:  36.9 C  SpO2: 96% 98%    Last Pain:  Vitals:   03/23/17 0800  TempSrc:   PainSc: 0-No pain      Patients Stated Pain Goal: 3 (01/04/75 2831)  Complications: No apparent anesthesia complications

## 2017-03-23 NOTE — Anesthesia Preprocedure Evaluation (Addendum)
Anesthesia Evaluation  Patient identified by MRN, date of birth, ID band Patient awake    Reviewed: Allergy & Precautions, NPO status , Patient's Chart, lab work & pertinent test results  Airway Mallampati: I       Dental  (+) Edentulous Upper, Edentulous Lower   Pulmonary former smoker,    Pulmonary exam normal breath sounds clear to auscultation       Cardiovascular hypertension, Normal cardiovascular exam Rhythm:Regular Rate:Normal     Neuro/Psych    GI/Hepatic   Endo/Other  diabetes  Renal/GU      Musculoskeletal   Abdominal Normal abdominal exam  (+)   Peds  Hematology   Anesthesia Other Findings Jeffrey [name]  ECHO COMPLETE WO IMAGE ENHANCING AGENT  Order# [id number]  Reading physician: Fay Records, MD Ordering physician: Donne Hazel, MD Study date: 05/27/15 Study Result   Result status: Final result                     *[place] [place], [place] [address]                            [phone]  ------------------------------------------------------------------- Transthoracic Echocardiography  Patient:    Jeffrey [name], Jeffrey [name] MR #:       [id number] Study Date: 05/27/2015 Gender:     M Age:        83 Height:     177.8 cm Weight:     77.1 kg BSA:        1.96 m^2 Pt. Status: Room:       [place]     [name], Jeffrey K  REFERRING    Donne Hazel  PERFORMING   Chmg, Forestine Na  SONOGRAPHER  Madelin Rear, RDCS  cc:  ------------------------------------------------------------------- LV EF: 45% -   50%  ------------------------------------------------------------------- Indications:      Chest pain 786.51.  ------------------------------------------------------------------- History:   PMH:   Coronary artery disease.  Stroke.  Risk  factors: Diabetes mellitus.  ------------------------------------------------------------------- Study Conclusions  - Left ventricle: The cavity size was mildly dilated. Wall   thickness was normal. Systolic function was mildly reduced. The   estimated ejection fraction was in the range of 45% to 50%.   Doppler parameters are consistent with abnormal left ventricular   relaxation (grade 1 diastolic dysfunction). - Left atrium: The atrium was moderately dilated. - Right ventricle: Systolic function was mildly reduced.  -------------------------------------------------------------------    Reproductive/Obstetrics                            Anesthesia Physical Anesthesia Plan  ASA: III  Anesthesia Plan: General   Post-op Pain Management:    Induction: Intravenous  PONV Risk Score and Plan: 3 and Ondansetron  Airway Management Planned: Oral ETT  Additional Equipment:   Intra-op Plan:   Post-operative Plan: Extubation in OR  Informed Consent: I have reviewed the patients History and Physical, chart, labs and discussed the procedure including the risks, benefits and  alternatives for the proposed anesthesia with the patient or authorized representative who has indicated his/her understanding and acceptance.   Dental advisory given  Plan Discussed with: CRNA and Surgeon  Anesthesia Plan Comments:        Anesthesia Quick Evaluation

## 2017-03-23 NOTE — Progress Notes (Signed)
PROGRESS NOTE    Jeffrey [name]  JAS:[id number] DOB: 08-05-1928 DOA: 03/18/2017 PCP: Jani Gravel, MD     Brief Narrative:  83 year old man admitted to the hospital on 9/5 following a mechanical fall that resulted in a left hip fracture. Evaluated by Dr. Aline Brochure who felt the patient was medically too complex to undergo further treatment at this hospital and was recommended to be transferred to [facility] for further care, however he remains in the [facility] emergency department pending bed availability.   Assessment & Plan:   Active Problems:   Hyperlipidemia   Arteriosclerotic cardiovascular disease (ASCVD)   Essential hypertension   GERD (gastroesophageal reflux disease)   Hypothyroidism   DM type 2 causing vascular disease (HCC)   Left displaced femoral neck fracture (HCC)   Cervical spinal stenosis    Feverx1 : 100.8 on 9/6 evening while at [employer],  no leukocytosis,  ua negative for bacteria, cxr no acute infiltrate, blood culture no growth  Start incentive spirometer  Left femoral neck fracture -Following mechanical fall. -patient is transferred to [facility] for surgery due to multiple risk factor. - Brillinta held for 5 days for surgery on 9/10 -will follow ortho recommendation regarding resuming brillinta   Coronary artery disease -Stable, no chest pain. -He actually saw his cardiologist on the day prior to presentation on 9/4 where it was noted that he was symptomatically stable. -Plan to continue aspirin, Coreg, Lipitor.  -Brillinta is on hold for now in light of upcoming surgery.  Hyperlipidemia -Continue Lipitor  Hypertension -Well-controlled, continue Coreg.  Hypothyroidism -Continue Synthroid  Insulin dependent Diabetes, a1c 6.6 -Fair control, continue current management.  Cervical spine stenosis -Noted on C-spine CT ordered on admission. -Also on this CT there was noted an irregularity of the C3 vertebral body with concerns for  discitis. -However in this patient without fever, white count, weakness, I feel this can be evaluated non-emergently with a C-spine MRI after correction of his acute fracture has been completed.  H/o anxiety attack when he lives by him self a year ago, per daughter patient has had frequent ED visit due to anxiety attacks/chest pain prior to transitioned to SNF a year ago Currently stable on home meds xanax 0.25bid   DVT prophylaxis: Lovenox Code Status: Full code Family Communication: Patient Disposition Plan:  Likely will need SNF at discharge, will follow PT recommendation  Consultants:   Orthopedics  Procedures:   None  Antimicrobials:  Anti-infectives    Start     Dose/Rate Route Frequency Ordered Stop   03/23/17 0600  ceFAZolin (ANCEF) IVPB 2g/100 mL premix     2 g 200 mL/hr over 30 Minutes Intravenous On call to O.R. 03/19/17 2244 03/24/17 0559       Subjective: Uneventful night, he is alert and oriented x3this am, no fever, He denies pain at rest this am  Objective: Vitals:   03/22/17 0540 03/22/17 1352 03/22/17 2100 03/23/17 0500  BP: 137/65 (!) 142/68 131/64 120/70  Pulse: 74 78 80 80  Resp:  18 18 18   Temp: 98.4 F (36.9 C) 97.8 F (36.6 C) 98.9 F (37.2 C) 98.5 F (36.9 C)  TempSrc: Oral Oral Oral Oral  SpO2: 96% 99% 96% 98%  Weight:      Height:        Intake/Output Summary (Last 24 hours) at 03/23/17 0842 Last data filed at 03/22/17 1355  Gross per 24 hour  Intake  0 ml  Output              550 ml  Net             -550 ml   Filed Weights   03/18/17 0731 03/19/17 2100  Weight: 80.7 kg (178 lb) 77.5 kg (170 lb 12.8 oz)    Examination:   General:  NAD  Cardiovascular: RRR  Respiratory: CTABL  Abdomen: Soft/ND/NT, positive BS  Musculoskeletal: No Edema, left leg shortened   Neuro: alert, oriented       Data Reviewed: I have personally reviewed following labs and imaging studies  CBC:  Recent Labs Lab  03/18/17 0808 03/19/17 0255 03/21/17 0659 03/22/17 0317 03/23/17 0215  WBC 6.3 9.9 8.2 6.6 5.7  NEUTROABS 4.7  --   --   --   --   HGB 13.0 13.0 12.6* 12.4* 12.9*  HCT 38.9* 38.5* 38.4* 37.8* 39.0  MCV 94.9 95.1 95.0 94.7 94.0  PLT 137* 146* 138* 146* 161   Basic Metabolic Panel:  Recent Labs Lab 03/18/17 0808 03/19/17 0255 03/20/17 1012 03/22/17 0317 03/23/17 0215  NA 136 136 136 137 133*  K 3.9 4.0 4.6 4.4 4.2  CL 103 105 104 106 100*  CO2 24 24 22 23 25   GLUCOSE 151* 142* 132* 167* 164*  BUN 15 18 21* 17 14  CREATININE 0.85 0.91 1.04 0.80 0.79  CALCIUM 9.3 8.8* 9.1 8.7* 8.6*  MG  --   --  1.9  --   --    GFR: Estimated Creatinine Clearance: 68.4 mL/min (by C-G formula based on SCr of 0.79 mg/dL). Liver Function Tests:  Recent Labs Lab 03/18/17 0808  AST 22  ALT 18  ALKPHOS 110  BILITOT 0.5  PROT 6.6  ALBUMIN 3.8   No results for input(s): LIPASE, AMYLASE in the last 168 hours. No results for input(s): AMMONIA in the last 168 hours. Coagulation Profile: No results for input(s): INR, PROTIME in the last 168 hours. Cardiac Enzymes:  Recent Labs Lab 03/19/17 0255 03/19/17 0821 03/19/17 1425  TROPONINI <0.03 <0.03 <0.03   BNP (last 3 results) No results for input(s): PROBNP in the last 8760 hours. HbA1C:  Recent Labs  03/20/17 1012  HGBA1C 6.6*   CBG:  Recent Labs Lab 03/22/17 0546 03/22/17 1311 03/22/17 1651 03/22/17 2133 03/23/17 0628  GLUCAP 163* 161* 159* 173* 162*   Lipid Profile: No results for input(s): CHOL, HDL, LDLCALC, TRIG, CHOLHDL, LDLDIRECT in the last 72 hours. Thyroid Function Tests:  Recent Labs  03/20/17 1012  TSH 1.730   Anemia Panel: No results for input(s): VITAMINB12, FOLATE, FERRITIN, TIBC, IRON, RETICCTPCT in the last 72 hours. Urine analysis:    Component Value Date/Time   COLORURINE YELLOW 03/21/2017 1208   APPEARANCEUR HAZY (A) 03/21/2017 1208   LABSPEC 1.018 03/21/2017 1208   PHURINE 5.0  03/21/2017 1208   GLUCOSEU NEGATIVE 03/21/2017 1208   HGBUR NEGATIVE 03/21/2017 [place] 03/21/2017 1208   KETONESUR NEGATIVE 03/21/2017 1208   PROTEINUR 30 (A) 03/21/2017 1208   UROBILINOGEN 0.2 05/19/2015 1449   NITRITE NEGATIVE 03/21/2017 1208   LEUKOCYTESUR NEGATIVE 03/21/2017 1208   Sepsis Labs: @LABRCNTIP (procalcitonin:4,lacticidven:4)  ) Recent Results (from the past 240 hour(s))  Surgical pcr screen     Status: None   Collection Time: 03/19/17  9:12 PM  Result Value Ref Range Status   MRSA, PCR NEGATIVE NEGATIVE Final   Staphylococcus aureus NEGATIVE NEGATIVE Final    Comment: (NOTE)  The Xpert SA Assay (FDA approved for NASAL specimens in patients 68 years of age and older), is one component of a comprehensive surveillance program. It is not intended to diagnose infection nor to guide or monitor treatment.   Culture, blood (routine x 2)     Status: None (Preliminary result)   Collection Time: 03/20/17 10:12 AM  Result Value Ref Range Status   Specimen Description BLOOD RIGHT ANTECUBITAL  Final   Special Requests   Final    BOTTLES DRAWN AEROBIC AND ANAEROBIC Blood Culture adequate volume   Culture NO GROWTH 2 DAYS  Final   Report Status PENDING  Incomplete  Culture, blood (routine x 2)     Status: None (Preliminary result)   Collection Time: 03/20/17 10:12 AM  Result Value Ref Range Status   Specimen Description BLOOD LEFT ANTECUBITAL  Final   Special Requests   Final    BOTTLES DRAWN AEROBIC AND ANAEROBIC Blood Culture adequate volume   Culture NO GROWTH 2 DAYS  Final   Report Status PENDING  Incomplete  Urine Culture     Status: Abnormal   Collection Time: 03/21/17 12:08 PM  Result Value Ref Range Status   Specimen Description URINE, RANDOM  Final   Special Requests NONE  Final   Culture <10,000 COLONIES/mL INSIGNIFICANT GROWTH (A)  Final   Report Status 03/22/2017 FINAL  Final         Radiology Studies: No results  found.      Scheduled Meds: . ALPRAZolam  0.25 mg Oral BID  . aspirin EC  81 mg Oral Daily  . atorvastatin  40 mg Oral q1800  . chlorhexidine  60 mL Topical Once  . enoxaparin (LOVENOX) injection  30 mg Subcutaneous Q24H  . feeding supplement (GLUCERNA SHAKE)  237 mL Oral Q1500  . finasteride  5 mg Oral Daily  . fludrocortisone  0.1 mg Oral Daily  . insulin aspart  0-15 Units Subcutaneous TID WC  . insulin aspart  0-5 Units Subcutaneous QHS  . insulin glargine  10 Units Subcutaneous QHS  . levothyroxine  50 mcg Oral QAC breakfast  . pantoprazole  40 mg Oral Daily  . PARoxetine  20 mg Oral Daily  . potassium chloride SA  40 mEq Oral BID  . povidone-iodine  2 application Topical Once  . tamsulosin  0.4 mg Oral Daily   Continuous Infusions: .  ceFAZolin (ANCEF) IV       LOS: 5 days    Time spent: 25 minutes.   I have personally reviewed and interpreted on  03/23/2017  daily labs,  imagings as discussed above under data review session and assessment and plans.  I reviewed all nursing notes, pharmacy notes, consultant notes,  vitals, pertinent old records  I have discussed plan of care as described above with RN , patient  on 03/23/2017     Florencia Reasons, MD PhD [facility] Pager [phone]  If 7PM-7AM, please contact night-coverage [email or web address] Password [facility] 03/23/2017, 8:42 AM

## 2017-03-23 NOTE — Op Note (Signed)
OPERATIVE REPORT  SURGEON: Rod Can, MD   ASSISTANT: Ky Barban, RNFA  PREOPERATIVE DIAGNOSIS: Displaced Left femoral neck fracture.   POSTOPERATIVE DIAGNOSIS: Displaced Left femoral neck fracture.   PROCEDURE: Left hip hemiarthroplasty, anterior approach.   IMPLANTS: DePuy AML stem, size 15, large stature, with a -3 mm spacer and a 53 mm monopolar head ball. Dall-Miles adult reconstruction cables 2.  ANESTHESIA:  General  ANTIBIOTICS: 2 g ancef. 1 g vancomycin (due to residing in nursing home).  ESTIMATED BLOOD LOSS: 400 mL.    DRAINS: None.  COMPLICATIONS: None   CONDITION: PACU - hemodynamically stable.   BRIEF CLINICAL NOTE: Jeffrey [name] is a 83 y.o. male with a displaced Left femoral neck fracture. The patient was admitted to the hospitalist service and underwent perioperative risk stratification and medical optimization. Preoperatively, the patient was taking Brillinta. Per cardiology recommendations, we waited 5 days before proceeding with surgery. The risks, benefits, and alternatives to hemiarthroplasty were explained, and the patient elected to proceed.  PROCEDURE IN DETAIL: The patient was taken to the operating room and general anesthesia was induced on the hospital bed. The patient was then positioned on the [place] table. All bony prominences were well padded. The hip was prepped and draped in the normal sterile surgical fashion. A time-out was called verifying side and site of surgery. Antibiotics were given within 60 minutes of beginning the procedure.  The direct anterior approach to the hip was performed through the Hueter interval. Lateral femoral circumflex vessels were treated with the Auqumantys. The anterior capsule was exposed and an inverted T capsulotomy was made. Fracture hematoma was encountered and evacuated. The patient was found to have a comminuted Left subcapital femoral neck fracture. I freshened the femoral neck cut with a  saw. I removed the femoral neck fragment. A corkscrew was placed into the head and the head was removed. This was passed to the back table and was measured.  I then examined the remaining femoral neck remnant. He had a V-shaped defect within the posterior medial calcar. At this point, I elected to place a prophylactic cable subperiosteally just superior to the lesser trochanter. I placed a second cable just superior to the original cable and brought the cable out along the surface of the greater trochanter. The cables were tightened and provisionally held.  Acetabular exposure was achieved. I examined the articular cartilage which was intact. The labrum was intact. A 53 mm trial head was placed and found to have excellent fit.  I then gained femoral exposure taking care to protect the abductors and greater trochanter. This was performed using standard external rotation, extension, and adduction. The capsule was peeled off the inner aspect of the greater trochanter, taking care to preserve the short external rotators. A cookie cutter was used to enter the femoral canal, and then the femoral canal finder was used to confirm location. Due to the presence of the V shaped defect within the calcar, I elected to proceed with distal fixation implant. I reamed up to a 14.5 mm reamer. I then sequentially broached up to a size 15 large stature. Calcar planer was used on the femoral neck remnant. I paced a trial neck and a 36 + 1.5 head ball.The hip was reduced. Leg lengths were checked fluoroscopically. The hip was dislocated and trial components were removed. I placed the real stem followed by the real spacer and head ball. A single reduction maneuver was performed and the hip was reduced. Fluoroscopy was used to confirm  component position and leg lengths. At 90 degrees of external rotation and extension, the hip was stable to an anterior directed force. I then crimped the cables and clipped the  ends.  The wound was copiously irrigated with normal saline solution. Marcaine solution was injected into the periarticular soft tissue. The wound was closed in layers using #1 Vicryl and V-Loc for the fascia, 2-0 Vicryl for the subcutaneous fat, 2-0 Monocryl for the deep dermal layer, 3-0 running Monocryl subcuticular stitch and glue for the skin. Once the glue was fully dried, an Aquacell Ag dressing was applied. The patient was then awakened from anesthesia and transported to the recovery room in stable condition. Sponge, needle, and instrument counts were correct at the end of the case x2. The patient tolerated the procedure well and there were no known complications.

## 2017-03-23 NOTE — Interval H&P Note (Signed)
History and Physical Interval Note:  03/23/2017 2:36 PM  Jeffrey [name]  has presented today for surgery, with the diagnosis of Hip Fracture  The various methods of treatment have been discussed with the patient and family. After consideration of risks, benefits and other options for treatment, the patient has consented to  Procedure(s): ANTERIOR APPROACH HEMI HIP ARTHROPLASTY (Left) as a surgical intervention .  The patient's history has been reviewed, patient examined, no change in status, stable for surgery.  I have reviewed the patient's chart and labs.  Questions were answered to the patient's satisfaction.    The risks, benefits, and alternatives were discussed with the patient. There are risks associated with the surgery including, but not limited to, problems with anesthesia (death), infection, instability (giving out of the joint), dislocation, differences in leg length/angulation/rotation, fracture of bones, loosening or failure of implants, hematoma (blood accumulation) which may require surgical drainage, blood clots, pulmonary embolism, nerve injury (foot drop and lateral thigh numbness), and blood vessel injury. The patient understands these risks and elects to proceed.  Jeffrey [name], Horald Pollen

## 2017-03-23 NOTE — Discharge Instructions (Signed)
°Dr. Cayman Kielbasa °Joint Replacement Specialist °[facility] °[address] °[place],  [address] °[phone] ° ° °TOTAL HIP REPLACEMENT POSTOPERATIVE DIRECTIONS ° ° ° °Hip Rehabilitation, Guidelines Following Surgery  ° °WEIGHT BEARING °Weight bearing as tolerated with assist device (walker, cane, etc) as directed, use it as long as suggested by your surgeon or therapist, typically at least 4-6 weeks. ° °The results of a hip operation are greatly improved after range of motion and muscle strengthening exercises. Follow all safety measures which are given to protect your hip. If any of these exercises cause increased pain or swelling in your joint, decrease the amount until you are comfortable again. Then slowly increase the exercises. Call your caregiver if you have problems or questions.  ° °HOME CARE INSTRUCTIONS  °Most of the following instructions are designed to prevent the dislocation of your new hip.  °Remove items at home which could result in a fall. This includes throw rugs or furniture in walking pathways.  °Continue medications as instructed at time of discharge. °· You may have some home medications which will be placed on hold until you complete the course of blood thinner medication. °· You may start showering once you are discharged home. Do not remove your dressing. °Do not put on socks or shoes without following the instructions of your caregivers.   °Sit on chairs with arms. Use the chair arms to help push yourself up when arising.  °Arrange for the use of a toilet seat elevator so you are not sitting low.  °· Walk with walker as instructed.  °You may resume a sexual relationship in one month or when given the OK by your caregiver.  °Use walker as long as suggested by your caregivers.  °You may put full weight on your legs and walk as much as is comfortable. °Avoid periods of inactivity such as sitting longer than an hour when not asleep. This helps prevent  blood clots.  °You may return to work once you are cleared by your surgeon.  °Do not drive a car for 6 weeks or until released by your surgeon.  °Do not drive while taking narcotics.  °Wear elastic stockings for two weeks following surgery during the day but you may remove then at night.  °Make sure you keep all of your appointments after your operation with all of your doctors and caregivers. You should call the office at the above phone number and make an appointment for approximately two weeks after the date of your surgery. °Please pick up a stool softener and laxative for home use as long as you are requiring pain medications. °· ICE to the affected hip every three hours for 30 minutes at a time and then as needed for pain and swelling. Continue to use ice on the hip for pain and swelling from surgery. You may notice swelling that will progress down to the foot and ankle.  This is normal after surgery.  Elevate the leg when you are not up walking on it.   °It is important for you to complete the blood thinner medication as prescribed by your doctor. °· Continue to use the breathing machine which will help keep your temperature down.  It is common for your temperature to cycle up and down following surgery, especially at night when you are not up moving around and exerting yourself.  The breathing machine keeps your lungs expanded and your temperature down. ° °RANGE OF MOTION AND STRENGTHENING EXERCISES  °These exercises are   designed to help you keep full movement of your hip joint. Follow your caregiver's or physical therapist's instructions. Perform all exercises about fifteen times, three times per day or as directed. Exercise both hips, even if you have had only one joint replacement. These exercises can be done on a training (exercise) mat, on the floor, on a table or on a bed. Use whatever works the best and is most comfortable for you. Use music or television while you are exercising so that the exercises  are a pleasant break in your day. This will make your life better with the exercises acting as a break in routine you can look forward to.  °Lying on your back, slowly slide your foot toward your buttocks, raising your knee up off the floor. Then slowly slide your foot back down until your leg is straight again.  °Lying on your back spread your legs as far apart as you can without causing discomfort.  °Lying on your side, raise your upper leg and foot straight up from the floor as far as is comfortable. Slowly lower the leg and repeat.  °Lying on your back, tighten up the muscle in the front of your thigh (quadriceps muscles). You can do this by keeping your leg straight and trying to raise your heel off the floor. This helps strengthen the largest muscle supporting your knee.  °Lying on your back, tighten up the muscles of your buttocks both with the legs straight and with the knee bent at a comfortable angle while keeping your heel on the floor.  ° °SKILLED REHAB INSTRUCTIONS: °If the patient is transferred to a skilled rehab facility following release from the hospital, a list of the current medications will be sent to the facility for the patient to continue.  When discharged from the skilled rehab facility, please have the facility set up the patient's Home Health Physical Therapy prior to being released. Also, the skilled facility will be responsible for providing the patient with their medications at time of release from the facility to include their pain medication and their blood thinner medication. If the patient is still at the rehab facility at time of the two week follow up appointment, the skilled rehab facility will also need to assist the patient in arranging follow up appointment in our office and any transportation needs. ° °MAKE SURE YOU:  °Understand these instructions.  °Will watch your condition.  °Will get help right away if you are not doing well or get worse. ° °Pick up stool softner and  laxative for home use following surgery while on pain medications. °Do not remove your dressing. °The dressing is waterproof--it is OK to take showers. °Continue to use ice for pain and swelling after surgery. °Do not use any lotions or creams on the incision until instructed by your surgeon. °Total Hip Protocol. ° ° °

## 2017-03-24 ENCOUNTER — Encounter (HOSPITAL_COMMUNITY): Payer: Self-pay | Admitting: Orthopedic Surgery

## 2017-03-24 LAB — BASIC METABOLIC PANEL
Anion gap: 7 (ref 5–15)
BUN: 13 mg/dL (ref 6–20)
CHLORIDE: 100 mmol/L — AB (ref 101–111)
CO2: 26 mmol/L (ref 22–32)
CREATININE: 0.84 mg/dL (ref 0.61–1.24)
Calcium: 8.5 mg/dL — ABNORMAL LOW (ref 8.9–10.3)
GFR calc Af Amer: >60 mL/min (ref >60)
GFR calc non Af Amer: >60 mL/min (ref >60)
GLUCOSE: 166 mg/dL — AB (ref 65–99)
POTASSIUM: 4.3 mmol/L (ref 3.5–5.1)
Sodium: 133 mmol/L — ABNORMAL LOW (ref 135–145)

## 2017-03-24 LAB — CBC
HEMATOCRIT: 34.9 % — AB (ref 39.0–52.0)
HEMOGLOBIN: 11.6 g/dL — AB (ref 13.0–17.0)
MCH: 31.4 pg (ref 26.0–34.0)
MCHC: 33.2 g/dL (ref 30.0–36.0)
MCV: 94.6 fL (ref 78.0–100.0)
Platelets: 152 10*3/uL (ref 150–400)
RBC: 3.69 MIL/uL — ABNORMAL LOW (ref 4.22–5.81)
RDW: 12.4 % (ref 11.5–15.5)
WBC: 8.6 10*3/uL (ref 4.0–10.5)

## 2017-03-24 LAB — GLUCOSE, CAPILLARY
GLUCOSE-CAPILLARY: 165 mg/dL — AB (ref 65–99)
GLUCOSE-CAPILLARY: 146 mg/dL — AB (ref 65–99)
Glucose-Capillary: 175 mg/dL — ABNORMAL HIGH (ref 65–99)
Glucose-Capillary: 256 mg/dL — ABNORMAL HIGH (ref 65–99)

## 2017-03-24 MED ORDER — HYDROCODONE-ACETAMINOPHEN 5-325 MG PO TABS: 1.0000 | ORAL_TABLET | Freq: Four times a day (QID) | ORAL | 0 refills | Status: AC | PRN

## 2017-03-24 NOTE — Evaluation (Signed)
Occupational Therapy Evaluation Patient Details Name: Jeffrey [name] MRN: [id number] DOB: 07/17/1928 Today's Date: 03/24/2017    History of Present Illness Pt is an 83 y.o. male admitted from a nursing home with c/o L hip pain after he fell on 03/18/2017; was taken to ED at [facility], where x-rays revealed a displaced L femoral neck fx; pt takes Brillinta, so waited 5 days for sx. Now s/p left hip hemiarthroplasty with direct anterior approach on 03/23/17. Pertinent PMH includes CVA, MI, GERD, HTN, CHF, cervical fx, chronic back pain.   Clinical Impression   This 83 y/o M presents with the above. Pt was mod independent with functional mobility at baseline, unclear as to Pt's PLOF for ADL completion. Pt completed stand pivot transfer to EOB from recliner with MinA this session using RW, requires multimodal cues for transfer completion and for safety throughout. Pt currently requires MaxA for LB ADLs. Pt will benefit from continued acute OT services and recommend additional SNF OT services after discharge to maximize Pt's safety and independence with ADLs and functional mobility.     Follow Up Recommendations  SNF;Supervision/Assistance - 24 hour    Equipment Recommendations  Other (comment) (to be determined in next venue )           Precautions / Restrictions Precautions Precautions: Fall Restrictions Weight Bearing Restrictions: Yes LLE Weight Bearing: Weight bearing as tolerated      Mobility Bed Mobility Overal bed mobility: Needs Assistance Bed Mobility: Sit to Supine     Supine to sit: Min guard;HOB elevated Sit to supine: Min assist   General bed mobility comments: assist for LE management to return to supine in bed   Transfers Overall transfer level: Needs assistance Equipment used: Rolling walker (2 wheeled) Transfers: Sit to/from [facility] Sit to Stand: Min assist Stand pivot transfers: Min assist       General transfer comment: assist to  boost into standing and to steady; cues for hand placement/posture upon standing; multimodal safety cues during stand pivot as Pt attempting to sit before fully reaching EOB     Balance Overall balance assessment: Needs assistance Sitting-balance support: No upper extremity supported;Feet supported Sitting balance-Leahy Scale: Fair     Standing balance support: Bilateral upper extremity supported;During functional activity Standing balance-Leahy Scale: Poor Standing balance comment: Reliant on BUE support and external support from therapist                            ADL either performed or assessed with clinical judgement   ADL Overall ADL's : Needs assistance/impaired Eating/Feeding: Set up;Sitting   Grooming: Set up;Sitting   Upper Body Bathing: Minimal assistance;Sitting   Lower Body Bathing: Moderate assistance;Sit to/from stand   Upper Body Dressing : Minimal assistance;Sitting   Lower Body Dressing: Maximal assistance;Sit to/from stand   Toilet Transfer: Minimal assistance;Stand-pivot;BSC;RW Armed forces technical officer Details (indicate cue type and reason): simulated in chair to bed transfer  Basile and Hygiene: Minimal assistance;Sit to/from stand       Functional mobility during ADLs: Minimal assistance;Rolling walker General ADL Comments: Pt taking pivotal steps from recliner to EOB with MinA using RW, multimodal safety cues as Pt attempting to sit early before reaching EOB                         Pertinent Vitals/Pain Pain Assessment: Faces Faces Pain Scale: Hurts little more Pain Location: L hip  Pain Descriptors / Indicators: Aching;Discomfort;Operative site guarding Pain Intervention(s): Limited activity within patient's tolerance;Monitored during session;Repositioned          Extremity/Trunk Assessment Upper Extremity Assessment Upper Extremity Assessment: Generalized weakness   Lower Extremity Assessment Lower  Extremity Assessment: Defer to PT evaluation LLE Deficits / Details: s/p L THA LLE: Unable to fully assess due to pain   Cervical / Trunk Assessment Cervical / Trunk Assessment: Kyphotic   Communication Communication Communication: HOH   Cognition Arousal/Alertness: Awake/alert Behavior During Therapy: WFL for tasks assessed/performed Overall Cognitive Status: No family/caregiver present to determine baseline cognitive functioning Area of Impairment: Attention;Following commands;Safety/judgement                   Current Attention Level: Sustained   Following Commands: Follows one step commands with increased time Safety/Judgement: Decreased awareness of safety   Problem Solving: Slow processing;Requires verbal cues;Requires tactile cues General Comments: Pt required increased time and multimodal cues for mobility and for safety; may have been exacerbated by [facility]   General Comments  SpO2 >90% on RA               Home Living Family/patient expects to be discharged to:: Skilled nursing facility                                        Prior Functioning/Environment Level of Independence: Needs assistance  Gait / Transfers Assistance Needed: Uses RW or walks behind manual w/c for balance.  ADL's / Homemaking Assistance Needed: Pt reports he was completing ADLs without assist, though unsure of acurracy of report; per chart review Pt was receiving some assist for ADL completion             OT Problem List: Decreased strength;Impaired balance (sitting and/or standing);Decreased activity tolerance;Decreased knowledge of use of DME or AE;Decreased safety awareness;Pain      OT Treatment/Interventions: Self-care/ADL training;DME and/or AE instruction;Therapeutic activities;Balance training;Therapeutic exercise;Energy conservation;Patient/family education    OT Goals(Current goals can be found in the care plan section) Acute Rehab OT Goals Patient Stated  Goal: Get back to walking OT Goal Formulation: With patient Time For Goal Achievement: 04/07/17 Potential to Achieve Goals: Good  OT Frequency: Min 2X/week                             AM-PAC PT "6 Clicks" Daily Activity     Outcome Measure Help from another person eating meals?: None Help from another person taking care of personal grooming?: A Little Help from another person toileting, which includes using toliet, bedpan, or urinal?: A Lot Help from another person bathing (including washing, rinsing, drying)?: A Lot Help from another person to put on and taking off regular upper body clothing?: A Little Help from another person to put on and taking off regular lower body clothing?: A Lot 6 Click Score: 16   End of Session Equipment Utilized During Treatment: Gait belt;Rolling walker Nurse Communication: Mobility status  Activity Tolerance: Patient tolerated treatment well Patient left: in bed;with call bell/phone within reach;with bed alarm set  OT Visit Diagnosis: Unsteadiness on feet (R26.81);Muscle weakness (generalized) (M62.81);Other abnormalities of gait and mobility (R26.89);Pain Pain - Right/Left: Left Pain - part of body: Hip                Time: 7616-0737 OT Time Calculation (min): 23 min  Charges:  OT General Charges $OT Visit: 1 Visit OT Evaluation $OT Eval Low Complexity: 1 Low G-Codes:     Lou Cal, OT Pager [phone] 03/24/2017   Raymondo Band 03/24/2017, 12:16 PM

## 2017-03-24 NOTE — Evaluation (Addendum)
Physical Therapy Evaluation Patient Details Name: Jeffrey [name] MRN: [id number] DOB: 07/20/1928 Today's Date: 03/24/2017   History of Present Illness  Pt is an 83 y.o. male admitted from a nursing home with c/o L hip pain after he fell on 03/18/2017; was taken to ED at [facility], where x-rays revealed a displaced L femoral neck fx; pt takes Brillinta, so waited 5 days for sx. Now s/p left hip hemiarthroplasty with direct anterior approach on 03/23/17. Pertinent PMH includes CVA, MI, GERD, HTN, CHF, cervical fx, chronic back pain.    Clinical Impression  Pt presents with pain and an overall decrease in functional mobility secondary to above. PTA, pt lives at nursing home and requires assist for mobility and ADLs. Today, pt able to transfer to bedside chair with RW and minA; increased anxiety with WB on LLE. Educ on WBAT precautions, fall risk reduction positioning, and importance of mobility. Pt would benefit from continued acute PT services to maximize functional mobility and independence prior to d/c with continued PT at his nursing home.    Follow Up Recommendations DC plan and follow up therapy as arranged by surgeon;Supervision for mobility/OOB    Equipment Recommendations  Rolling walker with 5" wheels    Recommendations for Other Services       Precautions / Restrictions Precautions Precautions: Fall Restrictions Weight Bearing Restrictions: Yes LLE Weight Bearing: Weight bearing as tolerated      Mobility  Bed Mobility Overal bed mobility: Needs Assistance Bed Mobility: Supine to Sit     Supine to sit: Min guard;HOB elevated     General bed mobility comments: Significant increased time with 1x supine rest break to sit EOB, but no physical assist required. Pt insistent on PT letting him mobilize by himself  Transfers Overall transfer level: Needs assistance Equipment used: Rolling walker (2 wheeled) Transfers: Sit to/from Stand Sit to Stand: Min assist          General transfer comment: Stood with RW and minA to boost trunk into standing, cues for hand placement with RW.   Ambulation/Gait Ambulation/Gait assistance: Min assist Ambulation Distance (Feet): 2 Feet Assistive device: Rolling walker (2 wheeled) Gait Pattern/deviations: Step-to pattern;Decreased stride length;Decreased weight shift to left;Antalgic;Trunk flexed Gait velocity: Decreased Gait velocity interpretation: <1.8 ft/sec, indicative of risk for recurrent falls General Gait Details: Took side steps from bed to chair with RW and minA for balance; requiring max encouragement and cues to WB through LLE as pt anxious about doing so.   Stairs            Wheelchair Mobility    Modified Rankin (Stroke Patients Only)       Balance Overall balance assessment: Needs assistance Sitting-balance support: No upper extremity supported;Feet supported Sitting balance-Leahy Scale: Fair     Standing balance support: Bilateral upper extremity supported;During functional activity Standing balance-Leahy Scale: Poor Standing balance comment: Reliant on BUE support                             Pertinent Vitals/Pain Pain Assessment: Faces Faces Pain Scale: Hurts a little bit Pain Location: L hip with WB Pain Descriptors / Indicators: Aching;Discomfort;Operative site guarding Pain Intervention(s): Limited activity within patient's tolerance;Monitored during session    Home Living Family/patient expects to be discharged to:: Skilled nursing facility                      Prior Function Level of Independence: Needs  assistance   Gait / Transfers Assistance Needed: Uses RW or walks behind manual w/c for balance.   ADL's / Homemaking Assistance Needed: Requires assist for ADLs        Hand Dominance        Extremity/Trunk Assessment   Upper Extremity Assessment Upper Extremity Assessment: Generalized weakness    Lower Extremity Assessment Lower Extremity  Assessment: Generalized weakness;LLE deficits/detail LLE Deficits / Details: s/p L THA LLE: Unable to fully assess due to pain    Cervical / Trunk Assessment Cervical / Trunk Assessment: Kyphotic  Communication   Communication: HOH  Cognition Arousal/Alertness: Awake/alert Behavior During Therapy: WFL for tasks assessed/performed Overall Cognitive Status: No family/caregiver present to determine baseline cognitive functioning Area of Impairment: Attention;Following commands;Safety/judgement                   Current Attention Level: Sustained   Following Commands: Follows multi-step commands with increased time Safety/Judgement: Decreased awareness of safety     General Comments: Pt required increased time and multimodal cues for mobility; may have been exacerbated by [facility]      General Comments General comments (skin integrity, edema, etc.): SpO2 >90% on RA    Exercises     Assessment/Plan    PT Assessment Patient needs continued PT services  PT Problem List Decreased strength;Decreased range of motion;Decreased activity tolerance;Decreased balance;Decreased mobility;Decreased knowledge of use of DME;Decreased safety awareness;Pain       PT Treatment Interventions DME instruction;Gait training;Stair training;Functional mobility training;Therapeutic activities;Therapeutic exercise;Balance training;Patient/family education    PT Goals (Current goals can be found in the Care Plan section)  Acute Rehab PT Goals Patient Stated Goal: Get back to walking PT Goal Formulation: With patient Time For Goal Achievement: 04/07/17 Potential to Achieve Goals: Good    Frequency Min 3X/week   Barriers to discharge        Co-evaluation               AM-PAC PT "6 Clicks" Daily Activity  Outcome Measure Difficulty turning over in bed (including adjusting bedclothes, sheets and blankets)?: A Little Difficulty moving from lying on back to sitting on the side of the bed?  : A Little Difficulty sitting down on and standing up from a chair with arms (e.g., wheelchair, bedside commode, etc,.)?: A Little Help needed moving to and from a bed to chair (including a wheelchair)?: A Little Help needed walking in hospital room?: A Little Help needed climbing 3-5 steps with a railing? : A Lot 6 Click Score: 17    End of Session Equipment Utilized During Treatment: Gait belt Activity Tolerance: Patient tolerated treatment well Patient left: in chair;with call bell/phone within reach Nurse Communication: Mobility status PT Visit Diagnosis: Other abnormalities of gait and mobility (R26.89);Muscle weakness (generalized) (M62.81);Pain Pain - Right/Left: Left Pain - part of body: Hip    Time: 1025-1056 PT Time Calculation (min) (ACUTE ONLY): 31 min   Charges:   PT Evaluation $PT Eval Moderate Complexity: 1 Mod PT Treatments $Therapeutic Activity: 8-22 mins   PT G Codes:       Mabeline Caras, PT, DPT Acute Rehab Services  Pager: [phone] 03/24/2017, 11:23 AM

## 2017-03-24 NOTE — Social Work (Addendum)
CSW spoke with Tammy in admission at [facility] and they are ready to take patient back once sitter discontinued. CSW advised that patient should be able to go to SNF tomorrow.  CSW will continue to follow for disposition.  Elissa Hefty, LCSW Clinical Social Worker [phone]

## 2017-03-24 NOTE — Progress Notes (Signed)
PROGRESS NOTE    Jeffrey [name]  HAL:[id number] DOB: Jun 01, 1929 DOA: 03/18/2017 PCP: Jani Gravel, MD     Brief Narrative:  83 year old man admitted to the hospital on 9/5 following a mechanical fall that resulted in a left hip fracture. Evaluated by Dr. Aline Brochure who felt the patient was medically too complex to undergo further treatment at this hospital and was recommended to be transferred to [facility] for further care, however he remains in the [facility] emergency department pending bed availability.   Assessment & Plan:   Active Problems:   Hyperlipidemia   Arteriosclerotic cardiovascular disease (ASCVD)   Essential hypertension   GERD (gastroesophageal reflux disease)   Hypothyroidism   DM type 2 causing vascular disease (HCC)   Left displaced femoral neck fracture (HCC)   Cervical spinal stenosis   Displaced fracture of left femoral neck (HCC)    Fever x1 : 100.8 on 9/6 evening while at [employer],  no leukocytosis,  ua negative for bacteria, cxr no acute infiltrate, blood culture no growth  Started incentive spirometer  Left femoral neck fracture -Following mechanical fall. -[facility] is [facility] [place] for surgery due to multiple risk factor. -Brillinta held for 5 days for surgery on 9/10, brillinta restarted post op -Patient is cleared to discharge from ortho stand point with brinlinta and asa. Patient is return in two weeks to follow up with ortho Dr Lyla Glassing.   Coronary artery disease -Stable, no chest pain. -He actually saw his cardiologist on the day prior to presentation on 9/4 where it was noted that he was symptomatically stable. -Plan to continue aspirin, Coreg, Lipitor.  -Brillinta held for surgery and resumed postop.   Hyperlipidemia -Continue Lipitor  Hypertension -Well-controlled, continue Coreg.  Hypothyroidism -Continue Synthroid  Insulin dependent Diabetes, a1c 6.6 -Fair control, continue current  management.  Cervical spine stenosis -Noted on C-spine CT ordered on admission. -Also on this CT there was noted an irregularity of the C3 vertebral body with concerns for discitis. -However in this patient without fever, white count, weakness, I feel this can be evaluated non-emergently with a C-spine MRI after correction of his acute fracture has been completed.  H/o anxiety attack when he lives by him self a year ago, per daughter patient has had frequent ED visit due to anxiety attacks/chest pain prior to transitioned to SNF a year ago Currently stable on home meds xanax 0.25bid   DVT prophylaxis: on asa, brillinta  Code Status: Full code Family Communication: Patient Disposition Plan:  SNF on 9/12 once 24hr sitter free  Consultants:   Orthopedics  Procedures:   s/p Left hip hemiarthroplasty, anterior approach on 9/10  Antimicrobials:  Anti-infectives    Start     Dose/Rate Route Frequency Ordered Stop   03/24/17 0600  ceFAZolin (ANCEF) IVPB 2g/100 mL premix  Status:  Discontinued     2 g 200 mL/hr over 30 Minutes Intravenous On call to O.R. 03/23/17 1204 03/23/17 1945   03/23/17 2100  ceFAZolin (ANCEF) IVPB 2g/100 mL premix     2 g 200 mL/hr over 30 Minutes Intravenous Every 6 hours 03/23/17 2018 03/24/17 0330   03/23/17 1530  vancomycin (VANCOCIN) IVPB 1000 mg/200 mL premix  Status:  Discontinued     1,000 mg 200 mL/hr over 60 Minutes Intravenous To Surgery 03/23/17 1515 03/23/17 1945   03/23/17 0600  ceFAZolin (ANCEF) IVPB 2g/100 mL premix     2 g 200 mL/hr over 30 Minutes Intravenous On call to  O.R. 03/19/17 2244 03/23/17 1500       Subjective: Post op day one, Uneventful night, he is alert and oriented x3, no fever   Objective: Vitals:   03/23/17 1953 03/24/17 0040 03/24/17 0427 03/24/17 1413  BP: (!) 143/64 134/67 120/80 (!) 117/44  Pulse: 82 86 90 68  Resp: 15 16 15    Temp: 98.5 F (36.9 C) (!) 97.3 F (36.3 C) 98 F (36.7 C) 98.1 F (36.7 C)   TempSrc:  Axillary Axillary Oral  SpO2: 97% 99% 99% 97%  Weight:      Height:        Intake/Output Summary (Last 24 hours) at 03/24/17 1602 Last data filed at 03/24/17 1414  Gross per 24 hour  Intake          3233.83 ml  Output             2400 ml  Net           833.83 ml   Filed Weights   03/18/17 0731 03/19/17 2100  Weight: 80.7 kg (178 lb) 77.5 kg (170 lb 12.8 oz)    Examination:   General:  NAD  Cardiovascular: RRR  Respiratory: CTABL  Abdomen: Soft/ND/NT, positive BS  Musculoskeletal: No Edema, left leg post op changes, pulse intact  Neuro: alert, oriented x3      Data Reviewed: I have personally reviewed following labs and imaging studies  CBC:  Recent Labs Lab 03/18/17 0808 03/19/17 0255 03/21/17 0659 03/22/17 0317 03/23/17 0215 03/24/17 0516  WBC 6.3 9.9 8.2 6.6 5.7 8.6  NEUTROABS 4.7  --   --   --   --   --   HGB 13.0 13.0 12.6* 12.4* 12.9* 11.6*  HCT 38.9* 38.5* 38.4* 37.8* 39.0 34.9*  MCV 94.9 95.1 95.0 94.7 94.0 94.6  PLT 137* 146* 138* 146* 156 734   Basic Metabolic Panel:  Recent Labs Lab 03/19/17 0255 03/20/17 1012 03/22/17 0317 03/23/17 0215 03/24/17 0516  NA 136 136 137 133* 133*  K 4.0 4.6 4.4 4.2 4.3  CL 105 104 106 100* 100*  CO2 24 22 23 25 26   GLUCOSE 142* 132* 167* 164* 166*  BUN 18 21* 17 14 13   CREATININE 0.91 1.04 0.80 0.79 0.84  CALCIUM 8.8* 9.1 8.7* 8.6* 8.5*  MG  --  1.9  --   --   --    GFR: Estimated Creatinine Clearance: 65.2 mL/min (by C-G formula based on SCr of 0.84 mg/dL). Liver Function Tests:  Recent Labs Lab 03/18/17 0808  AST 22  ALT 18  ALKPHOS 110  BILITOT 0.5  PROT 6.6  ALBUMIN 3.8   No results for input(s): LIPASE, AMYLASE in the last 168 hours. No results for input(s): AMMONIA in the last 168 hours. Coagulation Profile: No results for input(s): INR, PROTIME in the last 168 hours. Cardiac Enzymes:  Recent Labs Lab 03/19/17 0255 03/19/17 0821 03/19/17 1425  TROPONINI <0.03  <0.03 <0.03   BNP (last 3 results) No results for input(s): PROBNP in the last 8760 hours. HbA1C: No results for input(s): HGBA1C in the last 72 hours. CBG:  Recent Labs Lab 03/23/17 1218 03/23/17 1758 03/23/17 2136 03/24/17 0522 03/24/17 1154  GLUCAP 138* 171* 155* 165* 175*   Lipid Profile: No results for input(s): CHOL, HDL, LDLCALC, TRIG, CHOLHDL, LDLDIRECT in the last 72 hours. Thyroid Function Tests: No results for input(s): TSH, T4TOTAL, FREET4, T3FREE, THYROIDAB in the last 72 hours. Anemia Panel: No results for input(s): VITAMINB12, FOLATE,  FERRITIN, TIBC, IRON, RETICCTPCT in the last 72 hours. Urine analysis:    Component Value Date/Time   COLORURINE YELLOW 03/21/2017 1208   APPEARANCEUR HAZY (A) 03/21/2017 1208   LABSPEC 1.018 03/21/2017 1208   PHURINE 5.0 03/21/2017 1208   GLUCOSEU NEGATIVE 03/21/2017 1208   HGBUR NEGATIVE 03/21/2017 1208   [place] 03/21/2017 1208   KETONESUR NEGATIVE 03/21/2017 1208   PROTEINUR 30 (A) 03/21/2017 1208   UROBILINOGEN 0.2 05/19/2015 1449   NITRITE NEGATIVE 03/21/2017 1208   LEUKOCYTESUR NEGATIVE 03/21/2017 1208   Sepsis Labs: @LABRCNTIP (procalcitonin:4,lacticidven:4)  ) Recent Results (from the past 240 hour(s))  Surgical pcr screen     Status: None   Collection Time: 03/19/17  9:12 PM  Result Value Ref Range Status   MRSA, PCR NEGATIVE NEGATIVE Final   Staphylococcus aureus NEGATIVE NEGATIVE Final    Comment: (NOTE) The Xpert SA Assay (FDA approved for NASAL specimens in patients 74 years of age and older), is one component of a comprehensive surveillance program. It is not intended to diagnose infection nor to guide or monitor treatment.   Culture, blood (routine x 2)     Status: None (Preliminary result)   Collection Time: 03/20/17 10:12 AM  Result Value Ref Range Status   Specimen Description BLOOD RIGHT ANTECUBITAL  Final   Special Requests   Final    BOTTLES DRAWN AEROBIC AND ANAEROBIC Blood  Culture adequate volume   Culture NO GROWTH 4 DAYS  Final   Report Status PENDING  Incomplete  Culture, blood (routine x 2)     Status: None (Preliminary result)   Collection Time: 03/20/17 10:12 AM  Result Value Ref Range Status   Specimen Description BLOOD LEFT ANTECUBITAL  Final   Special Requests   Final    BOTTLES DRAWN AEROBIC AND ANAEROBIC Blood Culture adequate volume   Culture NO GROWTH 4 DAYS  Final   Report Status PENDING  Incomplete  Urine Culture     Status: Abnormal   Collection Time: 03/21/17 12:08 PM  Result Value Ref Range Status   Specimen Description URINE, RANDOM  Final   Special Requests NONE  Final   Culture <10,000 COLONIES/mL INSIGNIFICANT GROWTH (A)  Final   Report Status 03/22/2017 FINAL  Final         Radiology Studies: Pelvis Portable  Result Date: 03/23/2017 CLINICAL DATA:  Left hip replacement. EXAM: PORTABLE PELVIS 1-2 VIEWS COMPARISON:  03/18/2017 CT and 03/23/2017 intraoperative fluoroscopic images of the left hip. FINDINGS: New bipolar hip arthroplasty is noted with uncemented femoral stem. Two cerclage wires encircle the trochanteric portion of the left femur. Postoperative subcutaneous emphysema is identified. No immediate postoperative complications. IMPRESSION: New, intact, uncemented left bipolar hip arthroplasty. No immediate postoperative complications. Electronically Signed   By: Ashley Royalty M.D.   On: 03/23/2017 20:54   Dg C-arm 61-120 Min  Result Date: 03/23/2017 CLINICAL DATA:  83 year old male with a history of anterior approach hip arthroplasty EXAM: OPERATIVE LEFT HIP WITH PELVIS; DG C-ARM 61-120 MIN COMPARISON:  03/18/2017 FINDINGS: Limited intraoperative fluoroscopic spot images of right hip arthroplasty, with short stem femoral component and cerclage wire fixation proximally. IMPRESSION: Limited intraoperative fluoroscopic spot images of left hip arthroplasty, without immediate complicating features. Please refer to the dictated  operative report for full details of intraoperative findings and procedure. Electronically Signed   By: Corrie Mckusick D.O.   On: 03/23/2017 17:07   Dg Hip Operative Unilat With Pelvis Left  Result Date: 03/23/2017 CLINICAL DATA:  83 year old male  with a history of anterior approach hip arthroplasty EXAM: OPERATIVE LEFT HIP WITH PELVIS; DG C-ARM 61-120 MIN COMPARISON:  03/18/2017 FINDINGS: Limited intraoperative fluoroscopic spot images of right hip arthroplasty, with short stem femoral component and cerclage wire fixation proximally. IMPRESSION: Limited intraoperative fluoroscopic spot images of left hip arthroplasty, without immediate complicating features. Please refer to the dictated operative report for full details of intraoperative findings and procedure. Electronically Signed   By: Corrie Mckusick D.O.   On: 03/23/2017 17:07        Scheduled Meds: . ALPRAZolam  0.25 mg Oral BID  . aspirin EC  81 mg Oral Daily  . atorvastatin  40 mg Oral q1800  . feeding supplement (GLUCERNA SHAKE)  237 mL Oral Q1500  . finasteride  5 mg Oral Daily  . fludrocortisone  0.1 mg Oral Daily  . insulin aspart  0-15 Units Subcutaneous TID WC  . insulin aspart  0-5 Units Subcutaneous QHS  . insulin glargine  10 Units Subcutaneous QHS  . levothyroxine  50 mcg Oral QAC breakfast  . pantoprazole  40 mg Oral Daily  . PARoxetine  20 mg Oral Daily  . potassium chloride SA  40 mEq Oral BID  . tamsulosin  0.4 mg Oral Daily  . ticagrelor  60 mg Oral Daily   Continuous Infusions: . lactated ringers 10 mL/hr at 03/23/17 1307     LOS: 6 days    Time spent: 25 minutes.   I have personally reviewed and interpreted on  03/24/2017  daily labs,  imagings as discussed above under data review session and assessment and plans.  I reviewed all nursing notes, pharmacy notes, consultant notes,  vitals, pertinent old records  I have discussed plan of care as described above with ortho Dr Lyla Glassing, social worker, RN ,  patient  on 03/24/2017     Florencia Reasons, MD PhD [facility] Pager [phone]  If 7PM-7AM, please contact night-coverage [email or web address] Password [email or web address] 03/24/2017, 4:02 PM

## 2017-03-24 NOTE — Anesthesia Postprocedure Evaluation (Signed)
Anesthesia Post Note  Patient: Jeffrey [name]  Procedure(s) Performed: Procedure(s) (LRB): ANTERIOR APPROACH HEMI HIP ARTHROPLASTY (Left)     Patient location during evaluation: PACU Anesthesia Type: General Level of consciousness: awake and patient cooperative Pain management: pain level controlled Vital Signs Assessment: post-procedure vital signs reviewed and stable Respiratory status: spontaneous breathing, nonlabored ventilation, respiratory function stable and patient connected to nasal cannula oxygen Cardiovascular status: blood pressure returned to baseline and stable Postop Assessment: no signs of nausea or vomiting Anesthetic complications: no    Last Vitals:  Vitals:   03/24/17 0040 03/24/17 0427  BP: 134/67 120/80  Pulse: 86 90  Resp: 16 15  Temp: (!) 36.3 C 36.7 C  SpO2: 99% 99%    Last Pain:  Vitals:   03/24/17 0656  TempSrc:   PainSc: 8                  Gabbi Whetstone

## 2017-03-24 NOTE — NC FL2 (Signed)
[place] LEVEL OF CARE SCREENING TOOL     IDENTIFICATION  Patient Name: Jeffrey [name] Birthdate: Jun 27, 1929 Sex: male Admission Date (Current Location): 03/18/2017  [facility] and [place] Number:  Herbalist and Address:  [facility], [place] [address], [place], [place] [address]      Provider Number: [id number]  Attending Physician Name and Address:  Florencia Reasons, MD  Relative Name and Phone Number:  Toney Rakes [phone]    Current Level of Care: Hospital Recommended Level of Care: [place] Prior Approval Number:    Date Approved/Denied: 03/24/17 PASRR Number: [id number]  Discharge Plan: SNF    Current Diagnoses: Patient Active Problem List   Diagnosis Date Noted  . Displaced fracture of left femoral neck ([place]) 03/23/2017  . Left displaced femoral neck fracture ([place]) 03/18/2017  . Cervical spinal stenosis 03/18/2017  . Constipation 12/11/2015  . Hypothyroidism 10/17/2015  . DM type 2 causing vascular disease ([place]) 10/17/2015  . GERD (gastroesophageal reflux disease) 05/27/2015  . Lip mass 05/26/2015  . Atypical chest pain 03/02/2015  . Dyspepsia 03/02/2015  . Abdominal pain, epigastric 03/02/2015  . Essential hypertension 03/02/2015  . Angina at rest [facility]) 06/09/2014  . Abnormal ECG 06/09/2014  . Chest pain at rest 06/09/2014  . Dizziness 06/01/2014  . Unstable angina ([place]) 05/19/2014  . History of Clostridium difficile 11/05/2013  . Diarrhea 08/27/2013  . C. difficile diarrhea 08/27/2013  . Chest pain 06/16/2013  . Precordial chest pain 05/29/2013  . Coronary atherosclerosis of native coronary artery 05/29/2013  . Noncompliance 11/22/2012  . Anemia, normocytic normochromic   . Cardiomyopathy, ischemic 06/21/2012  . Cerebrovascular disease 06/08/2012  . Diabetes mellitus, type II ([place])   . Arteriosclerotic cardiovascular disease (ASCVD) 10/25/2011  . Hyperlipidemia 10/24/2011  . BPH  (benign prostatic hyperplasia) 05/07/2011    Orientation RESPIRATION BLADDER Height & Weight     Self, Time, Situation, Place  Normal Continent, External catheter Weight: 170 lb 12.8 oz (77.5 kg) Height:  5\' 10"  (177.8 cm)  BEHAVIORAL SYMPTOMS/MOOD NEUROLOGICAL BOWEL NUTRITION STATUS      Continent Diet  AMBULATORY STATUS COMMUNICATION OF NEEDS Skin   Limited Assist Verbally Surgical wounds                       Personal Care Assistance Level of Assistance  Dressing, Bathing, Feeding Bathing Assistance: Limited assistance Feeding assistance: Limited assistance Dressing Assistance: Maximum assistance     Functional Limitations Info             SPECIAL CARE FACTORS FREQUENCY  PT (By licensed PT), OT (By licensed OT)     PT Frequency: 3x week OT Frequency: 2x week            Contractures      Additional Factors Info  Code Status, Allergies, Insulin Sliding Scale Code Status Info: Full Code Allergies Info: No Known Allergies   Insulin Sliding Scale Info: Insulin daily       Current Medications (03/24/2017):  This is the current hospital active medication list Current Facility-Administered Medications  Medication Dose Route Frequency Provider Last Rate Last Dose  . acetaminophen (TYLENOL) tablet 650 mg  650 mg Oral Q6H PRN Rod Can, MD   650 mg at 03/24/17 1354   Or  . acetaminophen (TYLENOL) suppository 650 mg  650 mg Rectal Q6H PRN Swinteck, Aaron Edelman, MD      . ALPRAZolam Duanne Moron) tablet 0.25 mg  0.25 mg  Oral BID Kathie Dike, MD   0.25 mg at 03/24/17 1019  . alum & mag hydroxide-simeth (MAALOX/MYLANTA) 200-200-20 MG/5ML suspension 15 mL  15 mL Oral Q6H PRN Kathie Dike, MD      . aspirin EC tablet 81 mg  81 mg Oral Daily Memon, Jolaine Artist, MD   81 mg at 03/24/17 1020  . atorvastatin (LIPITOR) tablet 40 mg  40 mg Oral q1800 Kathie Dike, MD   40 mg at 03/22/17 1710  . feeding supplement (GLUCERNA SHAKE) (GLUCERNA SHAKE) liquid 237 mL  237 mL Oral  Q1500 Florencia Reasons, MD   237 mL at 03/24/17 1356  . finasteride (PROSCAR) tablet 5 mg  5 mg Oral Daily Kathie Dike, MD   5 mg at 03/24/17 1020  . fludrocortisone (FLORINEF) tablet 0.1 mg  0.1 mg Oral Daily Kathie Dike, MD   0.1 mg at 03/24/17 1020  . HYDROcodone-acetaminophen (NORCO/VICODIN) 5-325 MG per tablet 1-2 tablet  1-2 tablet Oral Q6H PRN Rod Can, MD   2 tablet at 03/24/17 0655  . insulin aspart (novoLOG) injection 0-15 Units  0-15 Units Subcutaneous TID WC Kathie Dike, MD   3 Units at 03/24/17 1356  . insulin aspart (novoLOG) injection 0-5 Units  0-5 Units Subcutaneous QHS Memon, Jolaine Artist, MD      . insulin glargine (LANTUS) injection 10 Units  10 Units Subcutaneous QHS Kathie Dike, MD   10 Units at 03/23/17 2200  . lactated ringers infusion   Intravenous Continuous Lyn Hollingshead, MD 10 mL/hr at 03/23/17 1307    . levothyroxine (SYNTHROID, LEVOTHROID) tablet 50 mcg  50 mcg Oral QAC breakfast Kathie Dike, MD   50 mcg at 03/24/17 1021  . loperamide (IMODIUM) capsule 2 mg  2 mg Oral Q6H PRN Kathie Dike, MD      . meclizine (ANTIVERT) tablet 25 mg  25 mg Oral Q8H PRN Kathie Dike, MD      . menthol-cetylpyridinium (CEPACOL) lozenge 3 mg  1 lozenge Oral PRN Swinteck, Aaron Edelman, MD       Or  . phenol (CHLORASEPTIC) mouth spray 1 spray  1 spray Mouth/Throat PRN Swinteck, Aaron Edelman, MD      . metoCLOPramide (REGLAN) tablet 5-10 mg  5-10 mg Oral Q8H PRN Swinteck, Aaron Edelman, MD       Or  . metoCLOPramide (REGLAN) injection 5-10 mg  5-10 mg Intravenous Q8H PRN Swinteck, Aaron Edelman, MD      . morphine 4 MG/ML injection 0.52 mg  0.52 mg Intravenous Q2H PRN Swinteck, Aaron Edelman, MD      . ondansetron (ZOFRAN) tablet 4 mg  4 mg Oral Q6H PRN Swinteck, Aaron Edelman, MD       Or  . ondansetron (ZOFRAN) injection 4 mg  4 mg Intravenous Q6H PRN Swinteck, Aaron Edelman, MD      . pantoprazole (PROTONIX) EC tablet 40 mg  40 mg Oral Daily Kathie Dike, MD   40 mg at 03/24/17 1021  . PARoxetine (PAXIL)  tablet 20 mg  20 mg Oral Daily Kathie Dike, MD   20 mg at 03/24/17 1021  . polyethylene glycol (MIRALAX / GLYCOLAX) packet 17 g  17 g Oral Daily PRN Kathie Dike, MD   17 g at 03/21/17 1536  . potassium chloride SA (K-DUR,KLOR-CON) CR tablet 40 mEq  40 mEq Oral BID Kathie Dike, MD   40 mEq at 03/24/17 1022  . tamsulosin (FLOMAX) capsule 0.4 mg  0.4 mg Oral Daily Kathie Dike, MD   0.4 mg at 03/24/17 1022  . ticagrelor (BRILINTA)  tablet 60 mg  60 mg Oral Daily Rod Can, MD   60 mg at 03/24/17 1022     Discharge Medications: Please see discharge summary for a list of discharge medications.  Relevant Imaging Results:  Relevant Lab Results:   Additional Information SS#: [id number]  [place], LCSW

## 2017-03-24 NOTE — Progress Notes (Signed)
   Subjective:  Patient reports pain as mild to moderate.  No c/o.  Objective:   VITALS:   Vitals:   03/23/17 1930 03/23/17 1953 03/24/17 0040 03/24/17 0427  BP: 103/60 (!) 143/64 134/67 120/80  Pulse: 82 82 86 90  Resp: 16 15 16 15   Temp: 98.5 F (36.9 C) 98.5 F (36.9 C) (!) 97.3 F (36.3 C) 98 F (36.7 C)  TempSrc:   Axillary Axillary  SpO2: 96% 97% 99% 99%  Weight:      Height:        NAD ABD soft Sensation intact distally Intact pulses distally Dorsiflexion/Plantar flexion intact Incision: dressing C/D/I Compartment soft   Lab Results  Component Value Date   WBC 8.6 03/24/2017   HGB 11.6 (L) 03/24/2017   HCT 34.9 (L) 03/24/2017   MCV 94.6 03/24/2017   PLT 152 03/24/2017   BMET    Component Value Date/Time   NA 133 (L) 03/24/2017 0516   NA 138 12/05/2015 0826   K 4.3 03/24/2017 0516   CL 100 (L) 03/24/2017 0516   CO2 26 03/24/2017 0516   GLUCOSE 166 (H) 03/24/2017 0516   BUN 13 03/24/2017 0516   BUN 15 12/05/2015 0826   CREATININE 0.84 03/24/2017 0516   CREATININE 0.94 06/21/2014 1501   CALCIUM 8.5 (L) 03/24/2017 0516   GFRNONAA >60 03/24/2017 0516   GFRNONAA 75 06/21/2014 1501   GFRAA >60 03/24/2017 0516   GFRAA 86 06/21/2014 1501     Assessment/Plan: 1 Day Post-Op   Active Problems:   Hyperlipidemia   Arteriosclerotic cardiovascular disease (ASCVD)   Essential hypertension   GERD (gastroesophageal reflux disease)   Hypothyroidism   DM type 2 causing vascular disease ([place])   Left displaced femoral neck fracture (HCC)   Cervical spinal stenosis   Displaced fracture of left femoral neck (HCC)   WBAT with walker DVT ppx: resume Brillinta and ASA, SCDs, TEDs PO pain control PT/OT Dispo: D/C to ALF with HHPT, doubt patient will be able to participate in 3 hrs of therapy per day   Jeffrey [name], Jeffrey [name] 03/24/2017, 7:50 AM   Rod Can, MD Cell [phone]

## 2017-03-25 DIAGNOSIS — M4802 Spinal stenosis, cervical region: Secondary | ICD-10-CM

## 2017-03-25 DIAGNOSIS — S72002A Fracture of unspecified part of neck of left femur, initial encounter for closed fracture: Secondary | ICD-10-CM

## 2017-03-25 DIAGNOSIS — I2510 Atherosclerotic heart disease of native coronary artery without angina pectoris: Secondary | ICD-10-CM

## 2017-03-25 LAB — GLUCOSE, CAPILLARY
GLUCOSE-CAPILLARY: 156 mg/dL — AB (ref 65–99)
Glucose-Capillary: 262 mg/dL — ABNORMAL HIGH (ref 65–99)

## 2017-03-25 LAB — CULTURE, BLOOD (ROUTINE X 2)
Culture: NO GROWTH
Culture: NO GROWTH
SPECIAL REQUESTS: ADEQUATE
SPECIAL REQUESTS: ADEQUATE

## 2017-03-25 LAB — BASIC METABOLIC PANEL
Anion gap: 4 — ABNORMAL LOW (ref 5–15)
BUN: 21 mg/dL — ABNORMAL HIGH (ref 6–20)
CO2: 24 mmol/L (ref 22–32)
Calcium: 8.4 mg/dL — ABNORMAL LOW (ref 8.9–10.3)
Chloride: 105 mmol/L (ref 101–111)
Creatinine, Ser: 0.88 mg/dL (ref 0.61–1.24)
GFR calc Af Amer: >60 mL/min (ref >60)
Glucose, Bld: 150 mg/dL — ABNORMAL HIGH (ref 65–99)
POTASSIUM: 4.8 mmol/L (ref 3.5–5.1)
SODIUM: 133 mmol/L — AB (ref 135–145)

## 2017-03-25 LAB — CBC
HCT: 31.2 % — ABNORMAL LOW (ref 39.0–52.0)
Hemoglobin: 10.3 g/dL — ABNORMAL LOW (ref 13.0–17.0)
MCH: 31.1 pg (ref 26.0–34.0)
MCHC: 33 g/dL (ref 30.0–36.0)
MCV: 94.3 fL (ref 78.0–100.0)
PLATELETS: 149 10*3/uL — AB (ref 150–400)
RBC: 3.31 MIL/uL — AB (ref 4.22–5.81)
RDW: 12.6 % (ref 11.5–15.5)
WBC: 8.5 10*3/uL (ref 4.0–10.5)

## 2017-03-25 MED ORDER — ALPRAZOLAM 0.25 MG PO TABS: 0.2500 mg | ORAL_TABLET | Freq: Two times a day (BID) | ORAL | 0 refills | Status: AC

## 2017-03-25 NOTE — Progress Notes (Signed)
Attempted to call report x 1--no answer and call disconnected. Will attempt again before [facility] arrives to transport.

## 2017-03-25 NOTE — Clinical Social Work Placement (Signed)
   CLINICAL SOCIAL WORK PLACEMENT  NOTE  Date:  03/25/2017  Patient Details  Name: IVERY NANNEY MRN: [id number] Date of Birth: Sep 24, 1928  Clinical Social Work is seeking post-discharge placement for this patient at the [place] level of care (*CSW will initial, date and re-position this form in  chart as items are completed):  Yes   Patient/family provided with [place] Work Department's list of facilities offering this level of care within the geographic area requested by the patient (or if unable, by the patient's family).  Yes   Patient/family informed of their freedom to choose among providers that offer the needed level of care, that participate in Medicare, Medicaid or managed care program needed by the patient, have an available bed and are willing to accept the patient.  Yes   Patient/family informed of [facility]'s ownership interest in [facility] and [facility], as well as of the fact that they are under no obligation to receive care at these facilities.  PASRR submitted to EDS on       PASRR number received on       Existing PASRR number confirmed on 03/24/17     FL2 transmitted to all facilities in geographic area requested by pt/family on       FL2 transmitted to all facilities within larger geographic area on 03/24/17     Patient informed that his/her managed care company has contracts with or will negotiate with certain facilities, including the following:        Yes   Patient/family informed of bed offers received.  Patient chooses bed at [place] [facility]     Physician recommends and patient chooses bed at      Patient to be transferred to [facility] [place] on 03/25/17.  Patient to be transferred to facility by [facility]     Patient family notified on 03/24/17 of transfer.  Name of family member notified:  patient responsible for self     PHYSICIAN Please prepare priority discharge summary, including  medications, Please sign FL2, Please prepare prescriptions     Additional Comment:    _______________________________________________ Normajean Baxter, LCSW 03/25/2017, 10:33 AM

## 2017-03-25 NOTE — Discharge Summary (Signed)
Physician Discharge Summary   Patient ID: Jeffrey [name] MRN: [id number] DOB/AGE: December 14, 1928 83 y.o.  Admit date: 03/18/2017 Discharge date: 03/25/2017  Primary Care Physician:  Jani Gravel, MD  Discharge Diagnoses:   . Left displaced femoral neck fracture ([place]) . DM type 2 causing vascular disease ([place]) . Essential hypertension . GERD (gastroesophageal reflux disease) . Hypothyroidism . Hyperlipidemia . Arteriosclerotic cardiovascular disease (ASCVD) . Cervical spinal stenosis    Consults: Orthopedics  Recommendations for Outpatient Follow-up:  1. Outpatient follow-up with Dr Lyla Glassing In 2 weeks 2. Please repeat CBC/BMET at next visit  DIET carb modified diet    Allergies:  No Known Allergies   DISCHARGE MEDICATIONS: Current Discharge Medication List    START taking these medications   Details  HYDROcodone-acetaminophen (NORCO/VICODIN) 5-325 MG tablet Take 1-2 tablets by mouth every 6 (six) hours as needed for moderate pain. Qty: 30 tablet, Refills: 0      CONTINUE these medications which have CHANGED   Details  ALPRAZolam (XANAX) 0.25 MG tablet Take 1 tablet (0.25 mg total) by mouth 2 (two) times daily. Qty: 20 tablet, Refills: 0      CONTINUE these medications which have NOT CHANGED   Details  acetaminophen (TYLENOL) 325 MG tablet Take 2 tablets (650 mg total) by mouth every 4 (four) hours as needed for headache or mild pain.    albuterol (PROVENTIL HFA;VENTOLIN HFA) 108 (90 BASE) MCG/ACT inhaler Inhale 2 puffs into the lungs every 6 (six) hours as needed for wheezing or shortness of breath.    aluminum-magnesium hydroxide-simethicone (MAALOX) 782-956-21 MG/5ML SUSP Take 15 mLs by mouth every 6 (six) hours as needed (indgestion).    aspirin EC 81 MG tablet Take 81 mg by mouth daily.    atorvastatin (LIPITOR) 40 MG tablet Take 1 tablet (40 mg total) by mouth daily at 6 PM. Qty: 30 tablet, Refills: 11    finasteride (PROSCAR) 5 MG tablet Take 5 mg by  mouth daily.    fludrocortisone (FLORINEF) 0.1 MG tablet Take 0.1 mg by mouth daily.    Hexylresorcinol (THROAT LOZENGES MT) Use as directed 1 lozenge in the mouth or throat every 4 (four) hours as needed (sore throat).    insulin glargine (LANTUS) 100 unit/mL SOPN Inject 10 Units into the skin at bedtime.    insulin lispro (HUMALOG) 100 UNIT/ML KwikPen Junior Inject 2-12 Units into the skin 4 (four) times daily. 151-200= 2 units; 201-250= 4 units; 251-300= 6 units; 301-350 = 8 units; 351-400= 10 units; >401 = 12 units and call MD.    levothyroxine (SYNTHROID, LEVOTHROID) 50 MCG tablet Take 50 mcg by mouth daily before breakfast. Reported on 10/10/2015    loperamide (IMODIUM A-D) 2 MG tablet Take 2 mg by mouth every 6 (six) hours as needed for diarrhea or loose stools.    meclizine (ANTIVERT) 25 MG tablet Take 25 mg by mouth every 8 (eight) hours as needed for dizziness.    Multiple Vitamin (MULTIVITAMIN WITH MINERALS) TABS Take 1 tablet by mouth daily. Reported on 10/10/2015    NITROSTAT 0.4 MG SL tablet PLACE 1 TAB UNDER TONGUE EVERY 5 MIN IF NEEDED FOR CHEST PAIN. MAY USE 3 TIMES.NO RELIEF CALL 911. Qty: 25 tablet, Refills: 3    pantoprazole (PROTONIX) 40 MG tablet Take 40 mg by mouth daily.    PARoxetine (PAXIL) 20 MG tablet Take 20 mg by mouth daily.    pioglitazone (ACTOS) 15 MG tablet Take 15 mg by mouth daily.    polyethylene glycol  powder (GLYCOLAX/MIRALAX) powder Take 1 Container by mouth daily as needed for moderate constipation.     potassium chloride SA (K-DUR,KLOR-CON) 20 MEQ tablet Take 40 mEq by mouth 2 (two) times daily.    tamsulosin (FLOMAX) 0.4 MG CAPS Take 0.4 mg by mouth daily.     ticagrelor (BRILINTA) 60 MG TABS tablet Take 1 tablet (60 mg total) by mouth 2 (two) times daily. Qty: 60 tablet, Refills: 6         Brief H and P: For complete details please refer to admission H and P, but in brief 83 year old man admitted to the hospital on 9/5 following a  mechanical fall that resulted in a left hip fracture. Evaluated by Dr. Aline Brochure who felt the patient was medically too complex to undergo further treatment at this hospital and [facility]:  Left femoral neck fracture  status post mechanical fall - Orthopedics was consulted, Brilinta was held for 5 days for surgery,  - Patient underwent left hip hemiarthroplasty on 9/10  By Dr Lyla Glassing  - Per orthopedics, WBAT as tolerated with walker, PT OT, resume Brilinta and aspirin  - Outpatient follow-up with orthopedics in 2 weeks   Coronary artery disease - Stable, no chest pain. Patient had seen his cardiologist today prior to the presentation and he was found to be stable. - Continue aspirin, Coreg, Lipitor, Brilinta   hyperlipidemia continue Lipitor  Hypertension Well-controlled, continue Coreg  Hypothyroidism Continue Synthroid  Insulin-dependent diabetes Hemoglobin A1c 6.6, continue current regimen  Cervical spine stenosis Noted on C-spine CT ordered on admission, also noted on the CT was an irregularity of C3 vertebral body inferiorly indicated above either erosive arthropathy or possible discitis. Patient has no fevers, leukocytosis or pain or decrease in range of movement Recommend outpatient MRI C spine for further workup.    Day of Discharge BP 122/78 (BP Location: Right Arm)   Pulse 69   Temp 98.9 F (37.2 C) (Oral)   Resp 15   Ht 5\' 10"  (1.778 m)   Wt 77.5 kg (170 lb 12.8 oz)   SpO2 93%   BMI 24.51 kg/m   Physical Exam: General: Alert and awake oriented x3 not in any acute distress. HEENT: anicteric sclera, pupils reactive to light and accommodation CVS: S1-S2 clear no murmur rubs or gallops Chest: clear to auscultation bilaterally, no wheezing rales or rhonchi Abdomen: soft nontender, nondistended, normal bowel sounds Extremities: no cyanosis, clubbing or edema noted bilaterally Neuro: Cranial  nerves II-XII intact, no focal neurological deficits   The results of significant diagnostics from this hospitalization (including imaging, microbiology, ancillary and laboratory) are listed below for reference.    LAB RESULTS: Basic Metabolic Panel:  Recent Labs Lab 03/20/17 1012  03/24/17 0516 03/25/17 0335  NA 136  < > 133* 133*  K 4.6  < > 4.3 4.8  CL 104  < > 100* 105  CO2 22  < > 26 24  GLUCOSE 132*  < > 166* 150*  BUN 21*  < > 13 21*  CREATININE 1.04  < > 0.84 0.88  CALCIUM 9.1  < > 8.5* 8.4*  MG 1.9  --   --   --   < > = values in this interval not displayed. Liver Function Tests: No results for input(s): AST, ALT, ALKPHOS, BILITOT, PROT, ALBUMIN in the last 168 hours. No results for input(s): LIPASE, AMYLASE in the last 168 hours. No results for  input(s): AMMONIA in the last 168 hours. CBC:  Recent Labs Lab 03/24/17 0516 03/25/17 0335  WBC 8.6 8.5  HGB 11.6* 10.3*  HCT 34.9* 31.2*  MCV 94.6 94.3  PLT 152 149*   Cardiac Enzymes:  Recent Labs Lab 03/19/17 0821 03/19/17 1425  TROPONINI <0.03 <0.03   BNP: Invalid input(s): POCBNP CBG:  Recent Labs Lab 03/25/17 0648 03/25/17 1109  GLUCAP 156* 262*    Significant Diagnostic Studies:  Ct Head Wo Contrast  Result Date: 03/18/2017 CLINICAL DATA:  Pain following fall EXAM: CT HEAD WITHOUT CONTRAST CT CERVICAL SPINE WITHOUT CONTRAST TECHNIQUE: Multidetector CT imaging of the head and cervical spine was performed following the standard protocol without intravenous contrast. Multiplanar CT image reconstructions of the cervical spine were also generated. COMPARISON:  Head CT March 21, 2012; cervical spine CT April 07, 2010 FINDINGS: CT HEAD FINDINGS Brain: There is moderate diffuse atrophy. There is no intracranial mass, hemorrhage, extra-axial fluid collection, or midline shift. There is mild patchy small vessel disease in the centra semiovale bilaterally. Elsewhere gray-white compartments appear normal.  No evident acute infarct. Note that a prior focal infarct in the right pons is less well seen at this time. Vascular: No hyperdense vessel. There is calcification in each carotid siphon region. There is also calcification in each distal vertebral artery. Skull: Bony calvarium appears intact. Sinuses/Orbits: There is mucosal thickening in multiple ethmoid air cells bilaterally. There is slight mucosal thickening in the inferior right frontal sinus. There is slight mucosal thickening in each posterior maxillary antrum. Other visualized paranasal sinuses are clear. Orbits appear symmetric bilaterally. Other: Mastoid air cells clear. CT CERVICAL SPINE FINDINGS Alignment: There is just under 4 mm of anterolisthesis of C3 on C4, not present on prior study. There is 3 mm of anterolisthesis of C4 on C5, present on prior study. Skull base and vertebrae: There is a moderate pannus posterior to the odontoid which is not causing appreciable impression on the craniocervical junction. There is bony hypertrophy along the superior odontoid, unchanged, not causing impression on the craniocervical junction. There is cystic change in the odontoid, stable. There is no acute fracture. There is no well-defined blastic or lytic bone lesion. In comparison with the previous study, there is irregularity and erosive change involving the inferior endplate of the C3 vertebral body. This appearance may be indicative of a degree of discitis. This appearance represents a significant change from prior study. Soft tissues and spinal canal: Prevertebral soft tissues and predental space regions are normal. No paraspinous lesions are noted. There is no cord edema or consolidation. There is again noted a bony fragment posterior to C3-4 toward the left which is impressing on the cord from a ventral approach causing moderately severe spinal stenosis focally. This finding was present on prior study and stable. Disc levels: There is marked disc space  narrowing with partial ankylosis at C5-6 and C6-7, also present on previous study. There is moderate disc space narrowing at C3-4 with concern for discitis involving the inferior C3 endplate as noted above. There is multifocal exit foraminal narrowing due to bony hypertrophy. No frank disc extrusion. Spinal stenosis at C3-4 due to the previously noted bony fragment is a stable finding. There is nuchal ligament calcification posterior to C5. Upper chest: Visualized upper lung zones are clear. There is aortic atherosclerosis. There is calcification in the left and right subclavian arteries. Other: There is bilateral carotid artery calcification. IMPRESSION: CT head: Atrophy with patchy periventricular small vessel disease. No intracranial mass  hemorrhage, or extra-axial fluid collection. No acute infarct evident. There are foci of arterial vascular calcification. Areas of paranasal sinus disease noted. CT cervical spine: 1.  No acute fracture is evident. 2. Areas of spondylolisthesis at C3-4 and C4-5. The spondylolisthesis at C3-4 was not present previously. 3. Irregularity of the endplate of the C3 vertebral body inferiorly. This appearance is indicative of either erosive arthropathy or possible discitis. Given concern for potential degree of discitis, cervical MRI pre and post-contrast is felt to be warranted. This study could be performed nonemergently but is warranted. 4. Multilevel arthropathy. Disc space narrowing is most marked at C5-6 and C6-7. There is ankylosis at C5-6. 5. Persistent bony fragment within the canal at the C3-4 level on the left causing impression on the cord and fairly severe stenosis. MR could be helpful for further delineation of the cord in this area. 6. Aortic atherosclerosis. Vascular calcifications at multiple levels including foci of carotid artery calcification bilaterally. Aortic Atherosclerosis (ICD10-I70.0). Electronically Signed   By: Lowella Grip III M.D.   On: 03/18/2017  10:31   Ct Cervical Spine Wo Contrast  Result Date: 03/18/2017 CLINICAL DATA:  Pain following fall EXAM: CT HEAD WITHOUT CONTRAST CT CERVICAL SPINE WITHOUT CONTRAST TECHNIQUE: Multidetector CT imaging of the head and cervical spine was performed following the standard protocol without intravenous contrast. Multiplanar CT image reconstructions of the cervical spine were also generated. COMPARISON:  Head CT March 21, 2012; cervical spine CT April 07, 2010 FINDINGS: CT HEAD FINDINGS Brain: There is moderate diffuse atrophy. There is no intracranial mass, hemorrhage, extra-axial fluid collection, or midline shift. There is mild patchy small vessel disease in the centra semiovale bilaterally. Elsewhere gray-white compartments appear normal. No evident acute infarct. Note that a prior focal infarct in the right pons is less well seen at this time. Vascular: No hyperdense vessel. There is calcification in each carotid siphon region. There is also calcification in each distal vertebral artery. Skull: Bony calvarium appears intact. Sinuses/Orbits: There is mucosal thickening in multiple ethmoid air cells bilaterally. There is slight mucosal thickening in the inferior right frontal sinus. There is slight mucosal thickening in each posterior maxillary antrum. Other visualized paranasal sinuses are clear. Orbits appear symmetric bilaterally. Other: Mastoid air cells clear. CT CERVICAL SPINE FINDINGS Alignment: There is just under 4 mm of anterolisthesis of C3 on C4, not present on prior study. There is 3 mm of anterolisthesis of C4 on C5, present on prior study. Skull base and vertebrae: There is a moderate pannus posterior to the odontoid which is not causing appreciable impression on the craniocervical junction. There is bony hypertrophy along the superior odontoid, unchanged, not causing impression on the craniocervical junction. There is cystic change in the odontoid, stable. There is no acute fracture. There is  no well-defined blastic or lytic bone lesion. In comparison with the previous study, there is irregularity and erosive change involving the inferior endplate of the C3 vertebral body. This appearance may be indicative of a degree of discitis. This appearance represents a significant change from prior study. Soft tissues and spinal canal: Prevertebral soft tissues and predental space regions are normal. No paraspinous lesions are noted. There is no cord edema or consolidation. There is again noted a bony fragment posterior to C3-4 toward the left which is impressing on the cord from a ventral approach causing moderately severe spinal stenosis focally. This finding was present on prior study and stable. Disc levels: There is marked disc space narrowing with partial ankylosis  at C5-6 and C6-7, also present on previous study. There is moderate disc space narrowing at C3-4 with concern for discitis involving the inferior C3 endplate as noted above. There is multifocal exit foraminal narrowing due to bony hypertrophy. No frank disc extrusion. Spinal stenosis at C3-4 due to the previously noted bony fragment is a stable finding. There is nuchal ligament calcification posterior to C5. Upper chest: Visualized upper lung zones are clear. There is aortic atherosclerosis. There is calcification in the left and right subclavian arteries. Other: There is bilateral carotid artery calcification. IMPRESSION: CT head: Atrophy with patchy periventricular small vessel disease. No intracranial mass hemorrhage, or extra-axial fluid collection. No acute infarct evident. There are foci of arterial vascular calcification. Areas of paranasal sinus disease noted. CT cervical spine: 1.  No acute fracture is evident. 2. Areas of spondylolisthesis at C3-4 and C4-5. The spondylolisthesis at C3-4 was not present previously. 3. Irregularity of the endplate of the C3 vertebral body inferiorly. This appearance is indicative of either erosive  arthropathy or possible discitis. Given concern for potential degree of discitis, cervical MRI pre and post-contrast is felt to be warranted. This study could be performed nonemergently but is warranted. 4. Multilevel arthropathy. Disc space narrowing is most marked at C5-6 and C6-7. There is ankylosis at C5-6. 5. Persistent bony fragment within the canal at the C3-4 level on the left causing impression on the cord and fairly severe stenosis. MR could be helpful for further delineation of the cord in this area. 6. Aortic atherosclerosis. Vascular calcifications at multiple levels including foci of carotid artery calcification bilaterally. Aortic Atherosclerosis (ICD10-I70.0). Electronically Signed   By: Lowella Grip III M.D.   On: 03/18/2017 10:31   Ct Hip Left Wo Contrast  Result Date: 03/18/2017 CLINICAL DATA:  Left hip pain after fall. EXAM: CT OF THE LEFT HIP WITHOUT CONTRAST TECHNIQUE: Multidetector CT imaging of the left hip was performed according to the standard protocol. Multiplanar CT image reconstructions were also generated. COMPARISON:  CT abdomen and pelvis dated May 04, 2015. FINDINGS: Bones/Joint/Cartilage There is a minimally comminuted, predominantly subcapital fracture of the left femoral neck with slight apex anterior and angulation and approximately 1.6 cm of impaction. No additional fractures identified. Mild degenerative changes of the left hip joint and left sacroiliac joint. Diffuse osteopenia. Ligaments Suboptimally assessed by CT. Muscles and Tendons No focal abnormality. Soft tissues Markedly enlarged prostate with central gland hypertrophy. Sigmoid diverticulosis. Atherosclerotic vascular calcifications. Small fat containing left inguinal hernia. IMPRESSION: 1. Minimally comminuted, predominantly subcapital fracture of the left femoral neck as described above. Electronically Signed   By: Titus Dubin M.D.   On: 03/18/2017 10:34   Dg Hip Port Unilat With Pelvis 1v  Left  Result Date: 03/18/2017 CLINICAL DATA:  Left hip fracture EXAM: DG HIP (WITH OR WITHOUT PELVIS) 1V PORT LEFT COMPARISON:  None. FINDINGS: Fracture of the left femoral neck with mild impaction and displacement. No other acute fracture Mild degenerative change in both hip joints. IMPRESSION: Mildly displaced left femoral neck fracture. Electronically Signed   By: Franchot Gallo M.D.   On: 03/18/2017 13:09    2D ECHO:   Disposition and Follow-up: Discharge Instructions    Diet Carb Modified    Complete by:  As directed    Increase activity slowly    Complete by:  As directed        DISPOSITION: Skilled nursing facility  DISCHARGE FOLLOW-UP Follow-up Information    Rod Can, MD. Schedule an appointment as  soon as possible for a visit in 2 week(s).   Specialty:  Orthopedic Surgery Why:  For wound re-check Contact information: [address] [place] [address] [phone]        Jani Gravel, MD. Schedule an appointment as soon as possible for a visit in 2 week(s).   Specialty:  Internal Medicine Contact information: [place]  [id number] [phone]            Time spent on Discharge: 37mins   Signed:   Estill Cotta M.D. [facility] 03/25/2017, 1:33 PM Pager: [phone]

## 2017-03-25 NOTE — Care Management Note (Signed)
Case Management Note  Patient Details  Name: Jeffrey [name] MRN: [id number] Date of Birth: 02/14/1929  Subjective/Objective:                 Patient with order to discharge to [facility] (SNF). SNF discharge facilitated through [place] ([place]). Please refer to [place] notes for disposition plan and direct questions CSW on call accordingly.    Action/Plan:   Expected Discharge Date:  03/25/17               Expected Discharge Plan:  Skilled Nursing Facility  In-House Referral:  Clinical Social Work  Discharge planning Services  CM Consult  Post Acute Care Choice:    Choice offered to:     DME Arranged:    DME Agency:     HH Arranged:    [place] Agency:     Status of Service:  Completed, signed off  If discussed at [employer], dates discussed:    Additional Comments:  Carles Collet, RN 03/25/2017, 10:57 AM

## 2017-03-25 NOTE — Progress Notes (Signed)
   Subjective:  Patient reports pain as mild to moderate.  No c/o.  Objective:   VITALS:   Vitals:   03/24/17 1413 03/24/17 1625 03/24/17 2151 03/25/17 0523  BP: (!) 117/44 (!) 112/58 (!) 99/56 122/78  Pulse: 68 (!) 107 85 69  Resp:   15 15  Temp: 98.1 F (36.7 C) 100 F (37.8 C) 99.1 F (37.3 C) 98.9 F (37.2 C)  TempSrc: Oral Axillary Oral Oral  SpO2: 97% 90% 93% 93%  Weight:      Height:        NAD ABD soft Sensation intact distally Intact pulses distally Dorsiflexion/Plantar flexion intact Incision: dressing C/D/I Compartment soft   Lab Results  Component Value Date   WBC 8.5 03/25/2017   HGB 10.3 (L) 03/25/2017   HCT 31.2 (L) 03/25/2017   MCV 94.3 03/25/2017   PLT 149 (L) 03/25/2017   BMET    Component Value Date/Time   NA 133 (L) 03/25/2017 0335   NA 138 12/05/2015 0826   K 4.8 03/25/2017 0335   CL 105 03/25/2017 0335   CO2 24 03/25/2017 0335   GLUCOSE 150 (H) 03/25/2017 0335   BUN 21 (H) 03/25/2017 0335   BUN 15 12/05/2015 0826   CREATININE 0.88 03/25/2017 0335   CREATININE 0.94 06/21/2014 1501   CALCIUM 8.4 (L) 03/25/2017 0335   GFRNONAA >60 03/25/2017 0335   GFRNONAA 75 06/21/2014 1501   GFRAA >60 03/25/2017 0335   GFRAA 86 06/21/2014 1501     Assessment/Plan: 2 Days Post-Op   Active Problems:   Hyperlipidemia   Arteriosclerotic cardiovascular disease (ASCVD)   Essential hypertension   GERD (gastroesophageal reflux disease)   Hypothyroidism   DM type 2 causing vascular disease ([facility])   Left displaced femoral neck fracture ([facility])   Cervical spinal stenosis   Displaced fracture of left femoral neck ([facility])   WBAT with walker DVT ppx: Brillinta and ASA, SCDs, TEDs PO pain control PT/OT Dispo: D/C to ALF with HHPT, doubt patient will be able to participate in 3 hrs of therapy per day   Jeffrey [name], Jeffrey [name] 03/25/2017, 7:46 AM   Rod Can, MD Cell [phone]

## 2017-03-25 NOTE — Social Work (Signed)
Clinical Social Worker facilitated patient discharge including contacting patient family and facility to confirm patient discharge plans.  Clinical information faxed to facility and family agreeable with plan.    CSW arranged ambulance transport via [facility] to [facility] [place].    RN to call [phone] to give report prior to discharge.  Clinical Social Worker will sign off for now as social work intervention is no longer needed. Please consult us again if new need arises.  Toni Demo, LCSW Clinical Social Worker [phone]    

## 2017-03-25 NOTE — Clinical Social Work Placement (Signed)
   CLINICAL SOCIAL WORK PLACEMENT  NOTE  Date:  03/25/2017  Patient Details  Name: Jeffrey [name] MRN: [id number] Date of Birth: [birth year]  Clinical Social Work is seeking post-discharge placement for this patient at the [place] level of care (*CSW will initial, date and re-position this form in  chart as items are completed):  Yes   Patient/family provided with [facility] Work Department's list of facilities offering this level of care within the geographic area requested by the patient (or if unable, by the patient's family).  Yes   Patient/family informed of their freedom to choose among providers that offer the needed level of care, that participate in Medicare, Medicaid or managed care program needed by the patient, have an available bed and are willing to accept the patient.  Yes   Patient/family informed of [facility]'s ownership interest in [facility] and [facility], as well as of the fact that they are under no obligation to receive care at these facilities.  PASRR submitted to EDS on       PASRR number received on       Existing PASRR number confirmed on 03/25/17     FL2 transmitted to all facilities in geographic area requested by pt/family on       FL2 transmitted to all facilities within larger geographic area on 03/25/17     Patient informed that his/her managed care company has contracts with or will negotiate with certain facilities, including the following:        Yes   Patient/family informed of bed offers received.  Patient chooses bed at [facility]     Physician recommends and patient chooses bed at      Patient to be transferred to [facility] [place] on 03/25/17.  Patient to be transferred to facility by [facility]     Patient family notified on 03/24/17 of transfer.  Name of family member notified:  patient responsible for self     PHYSICIAN Please prepare priority discharge summary, including  medications, Please sign FL2, Please prepare prescriptions     Additional Comment:    _______________________________________________ Normajean Baxter, LCSW 03/25/2017, 2:36 PM

## 2017-04-16 ENCOUNTER — Ambulatory Visit (HOSPITAL_COMMUNITY)
Admission: RE | Admit: 2017-04-16 | Discharge: 2017-04-16 | Disposition: A | Discharge status: Home / Self Care | Payer: Medicare Other | Source: Ambulatory Visit | Attending: Internal Medicine | Admitting: Internal Medicine

## 2017-04-16 DIAGNOSIS — I452 Bifascicular block: Secondary | ICD-10-CM | POA: Diagnosis not present

## 2017-04-16 DIAGNOSIS — I498 Other specified cardiac arrhythmias: Secondary | ICD-10-CM | POA: Diagnosis not present

## 2017-04-16 DIAGNOSIS — R079 Chest pain, unspecified: Secondary | ICD-10-CM | POA: Insufficient documentation

## 2017-04-16 DIAGNOSIS — I10 Essential (primary) hypertension: Secondary | ICD-10-CM | POA: Diagnosis not present

## 2017-05-14 DEATH — deceased
